# Patient Record
Sex: Male | Born: 1955
Health system: Southern US, Community
[De-identification: ages and names within clinical notes are randomized; demographics above are authoritative.]

## PROBLEM LIST (undated history)

## (undated) ENCOUNTER — Emergency Department (HOSPITAL_COMMUNITY): Discharge status: Against Medical Advice | Payer: Medicaid Other

## (undated) DIAGNOSIS — K5900 Constipation, unspecified: Secondary | ICD-10-CM

## (undated) DIAGNOSIS — J189 Pneumonia, unspecified organism: Secondary | ICD-10-CM

## (undated) DIAGNOSIS — K219 Gastro-esophageal reflux disease without esophagitis: Secondary | ICD-10-CM

## (undated) DIAGNOSIS — R413 Other amnesia: Secondary | ICD-10-CM

## (undated) DIAGNOSIS — K635 Polyp of colon: Secondary | ICD-10-CM

## (undated) DIAGNOSIS — R569 Unspecified convulsions: Secondary | ICD-10-CM

## (undated) DIAGNOSIS — E785 Hyperlipidemia, unspecified: Secondary | ICD-10-CM

## (undated) DIAGNOSIS — H9190 Unspecified hearing loss, unspecified ear: Secondary | ICD-10-CM

## (undated) DIAGNOSIS — F329 Major depressive disorder, single episode, unspecified: Secondary | ICD-10-CM

## (undated) DIAGNOSIS — Z9289 Personal history of other medical treatment: Secondary | ICD-10-CM

## (undated) DIAGNOSIS — K56609 Unspecified intestinal obstruction, unspecified as to partial versus complete obstruction: Secondary | ICD-10-CM

## (undated) DIAGNOSIS — F419 Anxiety disorder, unspecified: Secondary | ICD-10-CM

## (undated) DIAGNOSIS — F32A Depression, unspecified: Secondary | ICD-10-CM

## (undated) DIAGNOSIS — Z22322 Carrier or suspected carrier of Methicillin resistant Staphylococcus aureus: Secondary | ICD-10-CM

## (undated) DIAGNOSIS — I10 Essential (primary) hypertension: Secondary | ICD-10-CM

## (undated) DIAGNOSIS — C4490 Unspecified malignant neoplasm of skin, unspecified: Secondary | ICD-10-CM

## (undated) DIAGNOSIS — E119 Type 2 diabetes mellitus without complications: Secondary | ICD-10-CM

## (undated) HISTORY — DX: Unspecified intestinal obstruction, unspecified as to partial versus complete obstruction: K56.609

## (undated) HISTORY — PX: ORIF SHOULDER FRACTURE: SHX5035

## (undated) HISTORY — DX: Unspecified malignant neoplasm of skin, unspecified: C44.90

## (undated) HISTORY — DX: Carrier or suspected carrier of methicillin resistant Staphylococcus aureus: Z22.322

## (undated) HISTORY — PX: OTHER SURGICAL HISTORY: SHX169

## (undated) HISTORY — DX: Constipation, unspecified: K59.00

## (undated) HISTORY — DX: Depression, unspecified: F32.A

## (undated) HISTORY — DX: Pneumonia, unspecified organism: J18.9

## (undated) HISTORY — DX: Other amnesia: R41.3

## (undated) HISTORY — DX: Polyp of colon: K63.5

## (undated) HISTORY — PX: CARPAL TUNNEL RELEASE: SHX101

## (undated) HISTORY — DX: Type 2 diabetes mellitus without complications: E11.9

## (undated) HISTORY — DX: Personal history of other medical treatment: Z92.89

## (undated) HISTORY — DX: Major depressive disorder, single episode, unspecified: F32.9

## (undated) HISTORY — DX: Unspecified convulsions: R56.9

## (undated) HISTORY — DX: Unspecified hearing loss, unspecified ear: H91.90

---

## 1998-03-23 ENCOUNTER — Encounter: Admission: RE | Admit: 1998-03-23 | Discharge: 1998-03-23 | Payer: Self-pay | Admitting: Sports Medicine

## 1998-04-12 ENCOUNTER — Encounter: Admission: RE | Admit: 1998-04-12 | Discharge: 1998-04-12 | Payer: Self-pay | Admitting: Family Medicine

## 1998-05-16 ENCOUNTER — Encounter: Admission: RE | Admit: 1998-05-16 | Discharge: 1998-05-16 | Payer: Self-pay | Admitting: Sports Medicine

## 1998-05-17 ENCOUNTER — Encounter: Admission: RE | Admit: 1998-05-17 | Discharge: 1998-05-17 | Payer: Self-pay | Admitting: Family Medicine

## 1998-07-11 ENCOUNTER — Encounter: Admission: RE | Admit: 1998-07-11 | Discharge: 1998-07-11 | Payer: Self-pay | Admitting: Family Medicine

## 1998-10-04 ENCOUNTER — Encounter: Admission: RE | Admit: 1998-10-04 | Discharge: 1998-10-04 | Payer: Self-pay | Admitting: Sports Medicine

## 1998-10-05 ENCOUNTER — Encounter: Admission: RE | Admit: 1998-10-05 | Discharge: 1998-10-05 | Payer: Self-pay | Admitting: Family Medicine

## 1998-11-07 ENCOUNTER — Encounter: Admission: RE | Admit: 1998-11-07 | Discharge: 1998-11-07 | Payer: Self-pay | Admitting: Family Medicine

## 1998-11-20 ENCOUNTER — Emergency Department (HOSPITAL_COMMUNITY): Admission: EM | Admit: 1998-11-20 | Discharge: 1998-11-20 | Payer: Self-pay | Admitting: Emergency Medicine

## 1998-11-30 ENCOUNTER — Encounter: Admission: RE | Admit: 1998-11-30 | Discharge: 1998-11-30 | Payer: Self-pay | Admitting: Family Medicine

## 1998-12-20 ENCOUNTER — Encounter: Admission: RE | Admit: 1998-12-20 | Discharge: 1998-12-20 | Payer: Self-pay | Admitting: Sports Medicine

## 1998-12-28 ENCOUNTER — Encounter: Admission: RE | Admit: 1998-12-28 | Discharge: 1998-12-28 | Payer: Self-pay | Admitting: Family Medicine

## 1999-01-24 ENCOUNTER — Encounter: Admission: RE | Admit: 1999-01-24 | Discharge: 1999-01-24 | Payer: Self-pay | Admitting: Sports Medicine

## 1999-02-15 ENCOUNTER — Encounter: Admission: RE | Admit: 1999-02-15 | Discharge: 1999-02-15 | Payer: Self-pay | Admitting: *Deleted

## 1999-03-22 ENCOUNTER — Encounter: Admission: RE | Admit: 1999-03-22 | Discharge: 1999-03-22 | Payer: Self-pay | Admitting: Family Medicine

## 1999-04-05 ENCOUNTER — Encounter: Admission: RE | Admit: 1999-04-05 | Discharge: 1999-04-05 | Payer: Self-pay | Admitting: Family Medicine

## 1999-04-13 ENCOUNTER — Encounter: Admission: RE | Admit: 1999-04-13 | Discharge: 1999-04-13 | Payer: Self-pay | Admitting: Family Medicine

## 1999-04-19 ENCOUNTER — Encounter: Admission: RE | Admit: 1999-04-19 | Discharge: 1999-04-19 | Payer: Self-pay | Admitting: Family Medicine

## 1999-05-01 ENCOUNTER — Encounter: Admission: RE | Admit: 1999-05-01 | Discharge: 1999-05-01 | Payer: Self-pay | Admitting: Family Medicine

## 1999-06-01 ENCOUNTER — Encounter: Admission: RE | Admit: 1999-06-01 | Discharge: 1999-06-01 | Payer: Self-pay | Admitting: Family Medicine

## 1999-06-29 ENCOUNTER — Encounter: Admission: RE | Admit: 1999-06-29 | Discharge: 1999-06-29 | Payer: Self-pay | Admitting: Family Medicine

## 1999-07-07 ENCOUNTER — Encounter: Admission: RE | Admit: 1999-07-07 | Discharge: 1999-07-07 | Payer: Self-pay | Admitting: Family Medicine

## 1999-07-13 ENCOUNTER — Inpatient Hospital Stay (HOSPITAL_COMMUNITY): Admission: EM | Admit: 1999-07-13 | Discharge: 1999-07-14 | Payer: Self-pay | Admitting: *Deleted

## 1999-07-14 ENCOUNTER — Inpatient Hospital Stay (HOSPITAL_COMMUNITY): Admission: AD | Admit: 1999-07-14 | Discharge: 1999-07-26 | Payer: Self-pay | Admitting: Psychiatry

## 1999-07-14 ENCOUNTER — Encounter: Admission: RE | Admit: 1999-07-14 | Discharge: 1999-07-14 | Payer: Self-pay | Admitting: Family Medicine

## 1999-07-19 ENCOUNTER — Encounter: Payer: Self-pay | Admitting: Pediatrics

## 1999-07-26 ENCOUNTER — Encounter: Admission: RE | Admit: 1999-07-26 | Discharge: 1999-07-26 | Payer: Self-pay | Admitting: Family Medicine

## 1999-08-16 ENCOUNTER — Encounter: Admission: RE | Admit: 1999-08-16 | Discharge: 1999-08-16 | Payer: Self-pay | Admitting: Family Medicine

## 1999-09-05 ENCOUNTER — Encounter: Admission: RE | Admit: 1999-09-05 | Discharge: 1999-09-05 | Payer: Self-pay | Admitting: Family Medicine

## 1999-09-16 ENCOUNTER — Emergency Department (HOSPITAL_COMMUNITY): Admission: EM | Admit: 1999-09-16 | Discharge: 1999-09-16 | Payer: Self-pay

## 1999-09-20 ENCOUNTER — Emergency Department (HOSPITAL_COMMUNITY): Admission: EM | Admit: 1999-09-20 | Discharge: 1999-09-20 | Payer: Self-pay | Admitting: Emergency Medicine

## 1999-09-21 ENCOUNTER — Encounter: Admission: RE | Admit: 1999-09-21 | Discharge: 1999-09-21 | Payer: Self-pay | Admitting: Family Medicine

## 1999-09-25 ENCOUNTER — Encounter: Admission: RE | Admit: 1999-09-25 | Discharge: 1999-09-25 | Payer: Self-pay | Admitting: Family Medicine

## 1999-09-26 ENCOUNTER — Emergency Department (HOSPITAL_COMMUNITY): Admission: EM | Admit: 1999-09-26 | Discharge: 1999-09-26 | Payer: Self-pay | Admitting: Internal Medicine

## 1999-10-19 ENCOUNTER — Encounter: Admission: RE | Admit: 1999-10-19 | Discharge: 1999-10-19 | Payer: Self-pay | Admitting: Family Medicine

## 1999-11-16 ENCOUNTER — Encounter: Admission: RE | Admit: 1999-11-16 | Discharge: 1999-11-16 | Payer: Self-pay | Admitting: Family Medicine

## 2000-01-11 ENCOUNTER — Encounter: Admission: RE | Admit: 2000-01-11 | Discharge: 2000-01-11 | Payer: Self-pay | Admitting: Family Medicine

## 2000-02-04 ENCOUNTER — Emergency Department (HOSPITAL_COMMUNITY): Admission: EM | Admit: 2000-02-04 | Discharge: 2000-02-04 | Payer: Self-pay | Admitting: Emergency Medicine

## 2000-04-01 ENCOUNTER — Encounter: Admission: RE | Admit: 2000-04-01 | Discharge: 2000-04-01 | Payer: Self-pay | Admitting: Family Medicine

## 2000-10-14 ENCOUNTER — Encounter: Admission: RE | Admit: 2000-10-14 | Discharge: 2000-10-14 | Payer: Self-pay | Admitting: Family Medicine

## 2001-02-27 ENCOUNTER — Encounter: Admission: RE | Admit: 2001-02-27 | Discharge: 2001-02-27 | Payer: Self-pay | Admitting: Sports Medicine

## 2001-05-05 ENCOUNTER — Emergency Department (HOSPITAL_COMMUNITY): Admission: EM | Admit: 2001-05-05 | Discharge: 2001-05-05 | Payer: Self-pay | Admitting: Emergency Medicine

## 2001-05-09 ENCOUNTER — Encounter: Admission: RE | Admit: 2001-05-09 | Discharge: 2001-05-09 | Payer: Self-pay | Admitting: Family Medicine

## 2001-10-27 ENCOUNTER — Encounter: Admission: RE | Admit: 2001-10-27 | Discharge: 2001-10-27 | Payer: Self-pay | Admitting: Family Medicine

## 2001-10-28 ENCOUNTER — Encounter: Admission: RE | Admit: 2001-10-28 | Discharge: 2001-10-28 | Payer: Self-pay | Admitting: Family Medicine

## 2001-11-28 ENCOUNTER — Encounter: Admission: RE | Admit: 2001-11-28 | Discharge: 2001-11-28 | Payer: Self-pay | Admitting: Family Medicine

## 2001-12-05 ENCOUNTER — Encounter: Admission: RE | Admit: 2001-12-05 | Discharge: 2001-12-05 | Payer: Self-pay | Admitting: Family Medicine

## 2001-12-11 ENCOUNTER — Inpatient Hospital Stay (HOSPITAL_COMMUNITY): Admission: EM | Admit: 2001-12-11 | Discharge: 2001-12-15 | Payer: Self-pay | Admitting: Emergency Medicine

## 2001-12-11 ENCOUNTER — Encounter: Payer: Self-pay | Admitting: Emergency Medicine

## 2001-12-13 ENCOUNTER — Encounter: Payer: Self-pay | Admitting: Sports Medicine

## 2001-12-14 ENCOUNTER — Encounter: Payer: Self-pay | Admitting: Sports Medicine

## 2001-12-15 ENCOUNTER — Encounter: Payer: Self-pay | Admitting: Sports Medicine

## 2001-12-15 ENCOUNTER — Inpatient Hospital Stay (HOSPITAL_COMMUNITY): Admission: EM | Admit: 2001-12-15 | Discharge: 2001-12-23 | Payer: Self-pay | Admitting: Psychiatry

## 2001-12-22 ENCOUNTER — Encounter: Admission: RE | Admit: 2001-12-22 | Discharge: 2001-12-22 | Payer: Self-pay | Admitting: Sports Medicine

## 2001-12-25 ENCOUNTER — Encounter: Admission: RE | Admit: 2001-12-25 | Discharge: 2001-12-25 | Payer: Self-pay | Admitting: Family Medicine

## 2002-01-01 ENCOUNTER — Encounter: Admission: RE | Admit: 2002-01-01 | Discharge: 2002-01-01 | Payer: Self-pay | Admitting: Family Medicine

## 2002-01-16 ENCOUNTER — Encounter: Admission: RE | Admit: 2002-01-16 | Discharge: 2002-01-16 | Payer: Self-pay | Admitting: Family Medicine

## 2002-02-13 ENCOUNTER — Encounter: Admission: RE | Admit: 2002-02-13 | Discharge: 2002-02-13 | Payer: Self-pay | Admitting: Family Medicine

## 2002-02-23 ENCOUNTER — Encounter: Admission: RE | Admit: 2002-02-23 | Discharge: 2002-02-23 | Payer: Self-pay | Admitting: *Deleted

## 2002-03-02 ENCOUNTER — Encounter: Admission: RE | Admit: 2002-03-02 | Discharge: 2002-03-02 | Payer: Self-pay | Admitting: Family Medicine

## 2002-03-09 ENCOUNTER — Encounter: Admission: RE | Admit: 2002-03-09 | Discharge: 2002-03-09 | Payer: Self-pay | Admitting: *Deleted

## 2002-03-16 ENCOUNTER — Encounter: Admission: RE | Admit: 2002-03-16 | Discharge: 2002-03-16 | Payer: Self-pay | Admitting: Family Medicine

## 2002-03-20 ENCOUNTER — Encounter: Admission: RE | Admit: 2002-03-20 | Discharge: 2002-03-20 | Payer: Self-pay | Admitting: Family Medicine

## 2002-03-30 ENCOUNTER — Encounter: Admission: RE | Admit: 2002-03-30 | Discharge: 2002-03-30 | Payer: Self-pay | Admitting: Family Medicine

## 2002-04-02 ENCOUNTER — Encounter: Admission: RE | Admit: 2002-04-02 | Discharge: 2002-04-02 | Payer: Self-pay | Admitting: Family Medicine

## 2002-04-08 ENCOUNTER — Encounter: Admission: RE | Admit: 2002-04-08 | Discharge: 2002-04-08 | Payer: Self-pay | Admitting: *Deleted

## 2002-04-16 ENCOUNTER — Encounter: Admission: RE | Admit: 2002-04-16 | Discharge: 2002-04-16 | Payer: Self-pay | Admitting: Family Medicine

## 2002-04-28 ENCOUNTER — Encounter: Admission: RE | Admit: 2002-04-28 | Discharge: 2002-04-28 | Payer: Self-pay | Admitting: Family Medicine

## 2002-04-30 ENCOUNTER — Encounter: Admission: RE | Admit: 2002-04-30 | Discharge: 2002-04-30 | Payer: Self-pay | Admitting: Family Medicine

## 2002-05-13 ENCOUNTER — Encounter: Admission: RE | Admit: 2002-05-13 | Discharge: 2002-05-13 | Payer: Self-pay | Admitting: Family Medicine

## 2002-05-29 ENCOUNTER — Encounter: Admission: RE | Admit: 2002-05-29 | Discharge: 2002-05-29 | Payer: Self-pay | Admitting: Family Medicine

## 2002-06-26 ENCOUNTER — Encounter: Admission: RE | Admit: 2002-06-26 | Discharge: 2002-06-26 | Payer: Self-pay | Admitting: Family Medicine

## 2002-07-10 ENCOUNTER — Encounter: Admission: RE | Admit: 2002-07-10 | Discharge: 2002-07-10 | Payer: Self-pay | Admitting: Family Medicine

## 2002-07-14 ENCOUNTER — Encounter: Admission: RE | Admit: 2002-07-14 | Discharge: 2002-07-14 | Payer: Self-pay | Admitting: Family Medicine

## 2002-08-19 ENCOUNTER — Encounter: Admission: RE | Admit: 2002-08-19 | Discharge: 2002-08-19 | Payer: Self-pay | Admitting: Family Medicine

## 2002-09-21 ENCOUNTER — Emergency Department (HOSPITAL_COMMUNITY): Admission: EM | Admit: 2002-09-21 | Discharge: 2002-09-21 | Payer: Self-pay | Admitting: Emergency Medicine

## 2002-10-14 ENCOUNTER — Encounter: Admission: RE | Admit: 2002-10-14 | Discharge: 2002-10-14 | Payer: Self-pay | Admitting: Family Medicine

## 2003-01-06 ENCOUNTER — Encounter: Admission: RE | Admit: 2003-01-06 | Discharge: 2003-01-06 | Payer: Self-pay | Admitting: Family Medicine

## 2003-03-08 ENCOUNTER — Inpatient Hospital Stay (HOSPITAL_COMMUNITY): Admission: EM | Admit: 2003-03-08 | Discharge: 2003-03-09 | Payer: Self-pay | Admitting: Emergency Medicine

## 2003-03-08 ENCOUNTER — Encounter: Payer: Self-pay | Admitting: Emergency Medicine

## 2003-03-10 ENCOUNTER — Inpatient Hospital Stay (HOSPITAL_COMMUNITY): Admission: EM | Admit: 2003-03-10 | Discharge: 2003-03-15 | Payer: Self-pay | Admitting: Psychiatry

## 2003-04-13 ENCOUNTER — Encounter: Admission: RE | Admit: 2003-04-13 | Discharge: 2003-04-13 | Payer: Self-pay | Admitting: Family Medicine

## 2003-05-24 ENCOUNTER — Encounter: Admission: RE | Admit: 2003-05-24 | Discharge: 2003-05-24 | Payer: Self-pay | Admitting: Sports Medicine

## 2003-06-25 ENCOUNTER — Encounter: Admission: RE | Admit: 2003-06-25 | Discharge: 2003-06-25 | Payer: Self-pay | Admitting: Family Medicine

## 2004-02-03 ENCOUNTER — Encounter: Admission: RE | Admit: 2004-02-03 | Discharge: 2004-02-03 | Payer: Self-pay | Admitting: Family Medicine

## 2004-03-28 ENCOUNTER — Inpatient Hospital Stay (HOSPITAL_COMMUNITY): Admission: EM | Admit: 2004-03-28 | Discharge: 2004-04-01 | Payer: Self-pay | Admitting: Emergency Medicine

## 2004-03-31 ENCOUNTER — Encounter (INDEPENDENT_AMBULATORY_CARE_PROVIDER_SITE_OTHER): Payer: Self-pay | Admitting: *Deleted

## 2004-06-02 ENCOUNTER — Observation Stay (HOSPITAL_COMMUNITY): Admission: EM | Admit: 2004-06-02 | Discharge: 2004-06-02 | Payer: Self-pay | Admitting: Emergency Medicine

## 2004-06-06 ENCOUNTER — Encounter: Admission: RE | Admit: 2004-06-06 | Discharge: 2004-06-06 | Payer: Self-pay | Admitting: Family Medicine

## 2004-06-15 ENCOUNTER — Emergency Department (HOSPITAL_COMMUNITY): Admission: EM | Admit: 2004-06-15 | Discharge: 2004-06-15 | Payer: Self-pay | Admitting: Emergency Medicine

## 2004-07-18 ENCOUNTER — Emergency Department (HOSPITAL_COMMUNITY): Admission: EM | Admit: 2004-07-18 | Discharge: 2004-07-19 | Payer: Self-pay | Admitting: Emergency Medicine

## 2004-10-02 ENCOUNTER — Ambulatory Visit: Payer: Self-pay | Admitting: Family Medicine

## 2004-12-06 ENCOUNTER — Ambulatory Visit: Payer: Self-pay | Admitting: Sports Medicine

## 2004-12-20 ENCOUNTER — Ambulatory Visit: Payer: Self-pay | Admitting: Family Medicine

## 2004-12-27 ENCOUNTER — Ambulatory Visit: Payer: Self-pay | Admitting: Family Medicine

## 2005-01-04 ENCOUNTER — Emergency Department (HOSPITAL_COMMUNITY): Admission: EM | Admit: 2005-01-04 | Discharge: 2005-01-04 | Payer: Self-pay | Admitting: Emergency Medicine

## 2005-06-26 ENCOUNTER — Emergency Department (HOSPITAL_COMMUNITY): Admission: EM | Admit: 2005-06-26 | Discharge: 2005-06-26 | Payer: Self-pay | Admitting: Emergency Medicine

## 2005-08-16 ENCOUNTER — Ambulatory Visit: Payer: Self-pay | Admitting: Family Medicine

## 2005-10-10 ENCOUNTER — Ambulatory Visit: Payer: Self-pay | Admitting: Family Medicine

## 2005-10-15 ENCOUNTER — Ambulatory Visit: Payer: Self-pay | Admitting: Sports Medicine

## 2005-10-19 ENCOUNTER — Ambulatory Visit: Payer: Self-pay | Admitting: Family Medicine

## 2005-10-22 ENCOUNTER — Ambulatory Visit: Payer: Self-pay | Admitting: Family Medicine

## 2005-10-23 ENCOUNTER — Ambulatory Visit: Payer: Self-pay | Admitting: Family Medicine

## 2005-11-16 ENCOUNTER — Ambulatory Visit: Payer: Self-pay | Admitting: Family Medicine

## 2005-11-27 ENCOUNTER — Ambulatory Visit: Payer: Self-pay | Admitting: Family Medicine

## 2005-12-18 ENCOUNTER — Ambulatory Visit: Payer: Self-pay | Admitting: Sports Medicine

## 2005-12-27 ENCOUNTER — Ambulatory Visit: Payer: Self-pay | Admitting: Family Medicine

## 2006-01-03 ENCOUNTER — Ambulatory Visit: Payer: Self-pay | Admitting: Family Medicine

## 2006-01-18 ENCOUNTER — Ambulatory Visit (HOSPITAL_COMMUNITY): Admission: RE | Admit: 2006-01-18 | Discharge: 2006-01-18 | Payer: Self-pay | Admitting: Family Medicine

## 2006-07-01 ENCOUNTER — Ambulatory Visit: Payer: Self-pay | Admitting: Family Medicine

## 2006-07-22 ENCOUNTER — Ambulatory Visit: Payer: Self-pay | Admitting: Sports Medicine

## 2006-11-30 ENCOUNTER — Emergency Department (HOSPITAL_COMMUNITY): Admission: EM | Admit: 2006-11-30 | Discharge: 2006-11-30 | Payer: Self-pay | Admitting: Family Medicine

## 2007-01-10 ENCOUNTER — Encounter (INDEPENDENT_AMBULATORY_CARE_PROVIDER_SITE_OTHER): Payer: Self-pay | Admitting: Family Medicine

## 2007-01-10 ENCOUNTER — Ambulatory Visit: Payer: Self-pay | Admitting: Family Medicine

## 2007-01-10 LAB — CONVERTED CEMR LAB: Phenytoin Lvl: 14.6 ug/mL (ref 10.0–20.0)

## 2007-01-21 ENCOUNTER — Ambulatory Visit: Payer: Self-pay | Admitting: Family Medicine

## 2007-01-21 ENCOUNTER — Encounter (INDEPENDENT_AMBULATORY_CARE_PROVIDER_SITE_OTHER): Payer: Self-pay | Admitting: Family Medicine

## 2007-01-21 LAB — CONVERTED CEMR LAB
ALT: 25 units/L (ref 0–53)
AST: 18 units/L (ref 0–37)
Albumin: 4.3 g/dL (ref 3.5–5.2)
Alkaline Phosphatase: 80 units/L (ref 39–117)
BUN: 22 mg/dL (ref 6–23)
CO2: 24 meq/L (ref 19–32)
Calcium: 9.5 mg/dL (ref 8.4–10.5)
Chloride: 104 meq/L (ref 96–112)
Cholesterol: 138 mg/dL (ref 0–200)
Creatinine, Ser: 1.1 mg/dL (ref 0.40–1.50)
Glucose, Bld: 92 mg/dL (ref 70–99)
HDL: 55 mg/dL (ref >39)
LDL Cholesterol: 65 mg/dL (ref 0–99)
Potassium: 5.4 meq/L — ABNORMAL HIGH (ref 3.5–5.3)
Sodium: 138 meq/L (ref 135–145)
Total Bilirubin: 0.4 mg/dL (ref 0.3–1.2)
Total CHOL/HDL Ratio: 2.5
Total Protein: 7.1 g/dL (ref 6.0–8.3)
Triglycerides: 91 mg/dL (ref <150)
VLDL: 18 mg/dL (ref 0–40)

## 2007-02-05 ENCOUNTER — Ambulatory Visit: Payer: Self-pay | Admitting: Family Medicine

## 2007-02-05 ENCOUNTER — Encounter (INDEPENDENT_AMBULATORY_CARE_PROVIDER_SITE_OTHER): Payer: Self-pay | Admitting: Family Medicine

## 2007-02-05 LAB — CONVERTED CEMR LAB: Potassium: 4.1 meq/L (ref 3.5–5.3)

## 2007-02-27 ENCOUNTER — Ambulatory Visit: Payer: Self-pay | Admitting: Family Medicine

## 2007-02-27 ENCOUNTER — Telehealth: Payer: Self-pay | Admitting: *Deleted

## 2007-02-27 LAB — CONVERTED CEMR LAB: Rapid Strep: NEGATIVE

## 2007-02-28 ENCOUNTER — Encounter: Admission: RE | Admit: 2007-02-28 | Discharge: 2007-02-28 | Payer: Self-pay | Admitting: Sports Medicine

## 2007-03-01 ENCOUNTER — Emergency Department (HOSPITAL_COMMUNITY): Admission: EM | Admit: 2007-03-01 | Discharge: 2007-03-01 | Payer: Self-pay | Admitting: Family Medicine

## 2007-03-03 ENCOUNTER — Telehealth (INDEPENDENT_AMBULATORY_CARE_PROVIDER_SITE_OTHER): Payer: Self-pay | Admitting: *Deleted

## 2007-03-03 ENCOUNTER — Ambulatory Visit: Payer: Self-pay | Admitting: Family Medicine

## 2007-03-04 ENCOUNTER — Telehealth: Payer: Self-pay | Admitting: *Deleted

## 2007-05-20 ENCOUNTER — Encounter (INDEPENDENT_AMBULATORY_CARE_PROVIDER_SITE_OTHER): Payer: Self-pay | Admitting: Family Medicine

## 2007-05-20 ENCOUNTER — Ambulatory Visit: Payer: Self-pay | Admitting: Sports Medicine

## 2007-05-20 DIAGNOSIS — K219 Gastro-esophageal reflux disease without esophagitis: Secondary | ICD-10-CM | POA: Insufficient documentation

## 2007-05-20 LAB — CONVERTED CEMR LAB
BUN: 13 mg/dL (ref 6–23)
CO2: 24 meq/L (ref 19–32)
Calcium: 9.1 mg/dL (ref 8.4–10.5)
Chloride: 108 meq/L (ref 96–112)
Creatinine, Ser: 1.09 mg/dL (ref 0.40–1.50)
Glucose, Bld: 100 mg/dL — ABNORMAL HIGH (ref 70–99)
PSA: 0.4 ng/mL (ref 0.10–4.00)
PSA: NORMAL ng/mL
Potassium: 4.8 meq/L (ref 3.5–5.3)
Sodium: 141 meq/L (ref 135–145)

## 2007-05-21 ENCOUNTER — Encounter (INDEPENDENT_AMBULATORY_CARE_PROVIDER_SITE_OTHER): Payer: Self-pay | Admitting: Family Medicine

## 2007-06-20 ENCOUNTER — Emergency Department (HOSPITAL_COMMUNITY): Admission: EM | Admit: 2007-06-20 | Discharge: 2007-06-20 | Payer: Self-pay | Admitting: Emergency Medicine

## 2007-06-27 ENCOUNTER — Telehealth: Payer: Self-pay | Admitting: *Deleted

## 2007-07-01 ENCOUNTER — Ambulatory Visit: Payer: Self-pay | Admitting: Family Medicine

## 2007-07-01 DIAGNOSIS — M949 Disorder of cartilage, unspecified: Secondary | ICD-10-CM

## 2007-07-01 DIAGNOSIS — M899 Disorder of bone, unspecified: Secondary | ICD-10-CM | POA: Insufficient documentation

## 2007-07-02 ENCOUNTER — Encounter (INDEPENDENT_AMBULATORY_CARE_PROVIDER_SITE_OTHER): Payer: Self-pay | Admitting: Family Medicine

## 2007-07-02 ENCOUNTER — Ambulatory Visit: Payer: Self-pay | Admitting: Family Medicine

## 2007-07-02 LAB — CONVERTED CEMR LAB
ALT: 32 units/L (ref 0–53)
AST: 17 units/L (ref 0–37)
Albumin: 4.3 g/dL (ref 3.5–5.2)
Alkaline Phosphatase: 71 units/L (ref 39–117)
BUN: 14 mg/dL (ref 6–23)
CO2: 23 meq/L (ref 19–32)
Calcium: 8.9 mg/dL (ref 8.4–10.5)
Chloride: 106 meq/L (ref 96–112)
Creatinine, Ser: 0.99 mg/dL (ref 0.40–1.50)
Glucose, Bld: 100 mg/dL — ABNORMAL HIGH (ref 70–99)
Phenytoin Lvl: 12.6 ug/mL (ref 10.0–20.0)
Potassium: 4.7 meq/L (ref 3.5–5.3)
Sodium: 143 meq/L (ref 135–145)
Total Bilirubin: 0.4 mg/dL (ref 0.3–1.2)
Total Protein: 6.7 g/dL (ref 6.0–8.3)

## 2007-07-04 ENCOUNTER — Encounter (INDEPENDENT_AMBULATORY_CARE_PROVIDER_SITE_OTHER): Payer: Self-pay | Admitting: Family Medicine

## 2007-07-10 ENCOUNTER — Encounter (INDEPENDENT_AMBULATORY_CARE_PROVIDER_SITE_OTHER): Payer: Self-pay | Admitting: Family Medicine

## 2007-07-16 ENCOUNTER — Telehealth: Payer: Self-pay | Admitting: *Deleted

## 2007-10-29 ENCOUNTER — Ambulatory Visit: Payer: Self-pay | Admitting: Family Medicine

## 2007-11-05 ENCOUNTER — Emergency Department (HOSPITAL_COMMUNITY): Admission: EM | Admit: 2007-11-05 | Discharge: 2007-11-05 | Payer: Self-pay | Admitting: Family Medicine

## 2007-12-12 ENCOUNTER — Telehealth (INDEPENDENT_AMBULATORY_CARE_PROVIDER_SITE_OTHER): Payer: Self-pay | Admitting: *Deleted

## 2008-01-21 ENCOUNTER — Telehealth: Payer: Self-pay | Admitting: Family Medicine

## 2008-01-26 ENCOUNTER — Telehealth: Payer: Self-pay | Admitting: *Deleted

## 2008-01-28 ENCOUNTER — Encounter: Payer: Self-pay | Admitting: Family Medicine

## 2008-01-28 ENCOUNTER — Ambulatory Visit: Payer: Self-pay | Admitting: Family Medicine

## 2008-01-28 DIAGNOSIS — E785 Hyperlipidemia, unspecified: Secondary | ICD-10-CM | POA: Insufficient documentation

## 2008-01-28 DIAGNOSIS — F332 Major depressive disorder, recurrent severe without psychotic features: Secondary | ICD-10-CM | POA: Insufficient documentation

## 2008-01-28 DIAGNOSIS — G40909 Epilepsy, unspecified, not intractable, without status epilepticus: Secondary | ICD-10-CM | POA: Insufficient documentation

## 2008-01-28 DIAGNOSIS — I10 Essential (primary) hypertension: Secondary | ICD-10-CM | POA: Insufficient documentation

## 2008-01-28 LAB — CONVERTED CEMR LAB
BUN: 15 mg/dL (ref 6–23)
CO2: 24 meq/L (ref 19–32)
Calcium: 9.6 mg/dL (ref 8.4–10.5)
Chloride: 104 meq/L (ref 96–112)
Cholesterol: 160 mg/dL (ref 0–200)
Creatinine, Ser: 1.06 mg/dL (ref 0.40–1.50)
Glucose, Bld: 99 mg/dL (ref 70–99)
HDL: 56 mg/dL (ref >39)
LDL Cholesterol: 84 mg/dL (ref 0–99)
Phenytoin Lvl: 15.9 ug/mL (ref 10.0–20.0)
Potassium: 4.5 meq/L (ref 3.5–5.3)
Sodium: 139 meq/L (ref 135–145)
Testosterone: 689.95 ng/dL (ref 350–890)
Total CHOL/HDL Ratio: 2.9
Triglycerides: 102 mg/dL (ref <150)
VLDL: 20 mg/dL (ref 0–40)

## 2008-02-01 ENCOUNTER — Encounter: Payer: Self-pay | Admitting: Family Medicine

## 2008-02-04 ENCOUNTER — Telehealth: Payer: Self-pay | Admitting: *Deleted

## 2008-02-06 ENCOUNTER — Telehealth: Payer: Self-pay | Admitting: Family Medicine

## 2008-02-12 ENCOUNTER — Encounter: Payer: Self-pay | Admitting: Family Medicine

## 2008-03-17 ENCOUNTER — Encounter: Payer: Self-pay | Admitting: Family Medicine

## 2008-04-08 ENCOUNTER — Encounter (INDEPENDENT_AMBULATORY_CARE_PROVIDER_SITE_OTHER): Payer: Self-pay | Admitting: Family Medicine

## 2008-04-08 ENCOUNTER — Ambulatory Visit: Payer: Self-pay

## 2008-04-09 LAB — CONVERTED CEMR LAB: Phenytoin Lvl: 12.7 ug/mL (ref 10.0–20.0)

## 2008-04-15 ENCOUNTER — Ambulatory Visit: Payer: Self-pay | Admitting: Family Medicine

## 2008-04-15 ENCOUNTER — Encounter (INDEPENDENT_AMBULATORY_CARE_PROVIDER_SITE_OTHER): Payer: Self-pay | Admitting: *Deleted

## 2008-04-22 ENCOUNTER — Encounter: Payer: Self-pay | Admitting: *Deleted

## 2008-04-28 ENCOUNTER — Encounter: Admission: RE | Admit: 2008-04-28 | Discharge: 2008-04-28 | Payer: Self-pay | Admitting: Family Medicine

## 2008-04-28 ENCOUNTER — Ambulatory Visit: Payer: Self-pay | Admitting: Family Medicine

## 2008-04-28 ENCOUNTER — Telehealth: Payer: Self-pay | Admitting: *Deleted

## 2008-04-29 ENCOUNTER — Telehealth: Payer: Self-pay | Admitting: *Deleted

## 2008-05-03 ENCOUNTER — Telehealth: Payer: Self-pay | Admitting: *Deleted

## 2008-05-17 ENCOUNTER — Telehealth: Payer: Self-pay | Admitting: Family Medicine

## 2008-05-18 ENCOUNTER — Telehealth: Payer: Self-pay | Admitting: *Deleted

## 2008-05-21 ENCOUNTER — Ambulatory Visit: Payer: Self-pay | Admitting: Family Medicine

## 2008-05-28 ENCOUNTER — Ambulatory Visit (HOSPITAL_COMMUNITY): Admission: RE | Admit: 2008-05-28 | Discharge: 2008-05-28 | Payer: Self-pay | Admitting: Family Medicine

## 2008-06-09 ENCOUNTER — Encounter: Payer: Self-pay | Admitting: Family Medicine

## 2008-06-16 ENCOUNTER — Telehealth (INDEPENDENT_AMBULATORY_CARE_PROVIDER_SITE_OTHER): Payer: Self-pay | Admitting: Family Medicine

## 2008-06-24 ENCOUNTER — Ambulatory Visit: Payer: Self-pay | Admitting: Family Medicine

## 2008-06-25 ENCOUNTER — Telehealth: Payer: Self-pay | Admitting: Family Medicine

## 2008-06-30 ENCOUNTER — Telehealth: Payer: Self-pay | Admitting: *Deleted

## 2008-07-05 ENCOUNTER — Encounter: Admission: RE | Admit: 2008-07-05 | Discharge: 2008-07-05 | Payer: Self-pay | Admitting: Family Medicine

## 2008-07-12 ENCOUNTER — Telehealth: Payer: Self-pay | Admitting: *Deleted

## 2008-07-19 ENCOUNTER — Encounter: Payer: Self-pay | Admitting: Family Medicine

## 2008-07-27 ENCOUNTER — Encounter: Payer: Self-pay | Admitting: Family Medicine

## 2008-07-27 LAB — CONVERTED CEMR LAB
PSA: 3.58 ng/mL
Testosterone: 429 ng/dL

## 2008-07-29 ENCOUNTER — Ambulatory Visit: Payer: Self-pay | Admitting: Family Medicine

## 2008-07-29 ENCOUNTER — Encounter: Admission: RE | Admit: 2008-07-29 | Discharge: 2008-07-29 | Payer: Self-pay | Admitting: Family Medicine

## 2008-08-04 ENCOUNTER — Telehealth: Payer: Self-pay | Admitting: *Deleted

## 2008-08-04 ENCOUNTER — Emergency Department (HOSPITAL_COMMUNITY): Admission: EM | Admit: 2008-08-04 | Discharge: 2008-08-04 | Payer: Self-pay | Admitting: Emergency Medicine

## 2008-08-11 ENCOUNTER — Encounter (INDEPENDENT_AMBULATORY_CARE_PROVIDER_SITE_OTHER): Payer: Self-pay | Admitting: *Deleted

## 2008-08-16 ENCOUNTER — Telehealth: Payer: Self-pay | Admitting: Family Medicine

## 2008-09-03 ENCOUNTER — Encounter: Payer: Self-pay | Admitting: Family Medicine

## 2008-09-08 ENCOUNTER — Ambulatory Visit: Payer: Self-pay | Admitting: Family Medicine

## 2008-09-08 DIAGNOSIS — E663 Overweight: Secondary | ICD-10-CM | POA: Insufficient documentation

## 2008-11-30 ENCOUNTER — Encounter: Payer: Self-pay | Admitting: Family Medicine

## 2008-12-07 ENCOUNTER — Emergency Department (HOSPITAL_COMMUNITY): Admission: EM | Admit: 2008-12-07 | Discharge: 2008-12-07 | Payer: Self-pay | Admitting: Emergency Medicine

## 2008-12-12 ENCOUNTER — Telehealth (INDEPENDENT_AMBULATORY_CARE_PROVIDER_SITE_OTHER): Payer: Self-pay | Admitting: Family Medicine

## 2008-12-17 HISTORY — PX: COLONOSCOPY: SHX174

## 2008-12-30 ENCOUNTER — Encounter: Payer: Self-pay | Admitting: Family Medicine

## 2008-12-30 ENCOUNTER — Ambulatory Visit: Payer: Self-pay | Admitting: Family Medicine

## 2008-12-30 ENCOUNTER — Encounter: Payer: Self-pay | Admitting: *Deleted

## 2008-12-30 DIAGNOSIS — K7689 Other specified diseases of liver: Secondary | ICD-10-CM | POA: Insufficient documentation

## 2008-12-30 DIAGNOSIS — K56609 Unspecified intestinal obstruction, unspecified as to partial versus complete obstruction: Secondary | ICD-10-CM | POA: Insufficient documentation

## 2008-12-30 LAB — CONVERTED CEMR LAB
ALT: 30 units/L (ref 0–53)
AST: 19 units/L (ref 0–37)
Albumin: 4.3 g/dL (ref 3.5–5.2)
Alkaline Phosphatase: 78 units/L (ref 39–117)
BUN: 16 mg/dL (ref 6–23)
CO2: 24 meq/L (ref 19–32)
Calcium: 9.2 mg/dL (ref 8.4–10.5)
Chloride: 103 meq/L (ref 96–112)
Creatinine, Ser: 1.09 mg/dL (ref 0.40–1.50)
Glucose, Bld: 108 mg/dL — ABNORMAL HIGH (ref 70–99)
Potassium: 5.2 meq/L (ref 3.5–5.3)
Sodium: 141 meq/L (ref 135–145)
Total Bilirubin: 0.3 mg/dL (ref 0.3–1.2)
Total Protein: 6.8 g/dL (ref 6.0–8.3)

## 2009-01-02 ENCOUNTER — Encounter: Payer: Self-pay | Admitting: Family Medicine

## 2009-01-10 ENCOUNTER — Telehealth: Payer: Self-pay | Admitting: *Deleted

## 2009-01-25 ENCOUNTER — Encounter: Payer: Self-pay | Admitting: Family Medicine

## 2009-01-27 ENCOUNTER — Ambulatory Visit: Payer: Self-pay | Admitting: Family Medicine

## 2009-02-04 ENCOUNTER — Encounter: Payer: Self-pay | Admitting: Family Medicine

## 2009-02-14 ENCOUNTER — Telehealth: Payer: Self-pay | Admitting: Family Medicine

## 2009-03-04 ENCOUNTER — Encounter: Payer: Self-pay | Admitting: Family Medicine

## 2009-04-12 ENCOUNTER — Ambulatory Visit: Payer: Self-pay | Admitting: Family Medicine

## 2009-04-18 ENCOUNTER — Emergency Department (HOSPITAL_COMMUNITY): Admission: EM | Admit: 2009-04-18 | Discharge: 2009-04-18 | Payer: Self-pay | Admitting: Emergency Medicine

## 2009-04-22 ENCOUNTER — Ambulatory Visit: Payer: Self-pay | Admitting: Family Medicine

## 2009-05-12 ENCOUNTER — Ambulatory Visit: Payer: Self-pay | Admitting: Family Medicine

## 2009-06-16 ENCOUNTER — Telehealth: Payer: Self-pay | Admitting: Family Medicine

## 2009-06-22 ENCOUNTER — Encounter: Payer: Self-pay | Admitting: Family Medicine

## 2009-07-19 ENCOUNTER — Telehealth (INDEPENDENT_AMBULATORY_CARE_PROVIDER_SITE_OTHER): Payer: Self-pay | Admitting: *Deleted

## 2009-08-24 ENCOUNTER — Encounter: Payer: Self-pay | Admitting: Family Medicine

## 2009-08-24 ENCOUNTER — Ambulatory Visit: Payer: Self-pay | Admitting: Family Medicine

## 2009-08-24 LAB — CONVERTED CEMR LAB

## 2009-08-25 LAB — CONVERTED CEMR LAB
ALT: 33 units/L (ref 0–53)
AST: 22 units/L (ref 0–37)
Albumin: 4.2 g/dL (ref 3.5–5.2)
Alkaline Phosphatase: 66 units/L (ref 39–117)
BUN: 18 mg/dL (ref 6–23)
CO2: 20 meq/L (ref 19–32)
Calcium: 8.4 mg/dL (ref 8.4–10.5)
Chloride: 102 meq/L (ref 96–112)
Cholesterol: 127 mg/dL (ref 0–200)
Creatinine, Ser: 1.25 mg/dL (ref 0.40–1.50)
Glucose, Bld: 172 mg/dL — ABNORMAL HIGH (ref 70–99)
HCT: 48.3 % (ref 39.0–52.0)
HDL: 42 mg/dL (ref >39)
Hemoglobin: 16.7 g/dL (ref 13.0–17.0)
LDL Cholesterol: 52 mg/dL (ref 0–99)
MCHC: 34.6 g/dL (ref 30.0–36.0)
MCV: 95.1 fL (ref 78.0–100.0)
PSA: 0.45 ng/mL (ref 0.10–4.00)
Phenytoin Lvl: 27 ug/mL — ABNORMAL HIGH (ref 10.0–20.0)
Platelets: 185 10*3/uL (ref 150–400)
Potassium: 4.1 meq/L (ref 3.5–5.3)
RBC: 5.08 M/uL (ref 4.22–5.81)
RDW: 14.6 % (ref 11.5–15.5)
Sodium: 138 meq/L (ref 135–145)
Testosterone: 525.37 ng/dL (ref 350–890)
Total Bilirubin: 0.3 mg/dL (ref 0.3–1.2)
Total CHOL/HDL Ratio: 3
Total Protein: 6.7 g/dL (ref 6.0–8.3)
Triglycerides: 163 mg/dL — ABNORMAL HIGH (ref <150)
VLDL: 33 mg/dL (ref 0–40)
WBC: 6.9 10*3/uL (ref 4.0–10.5)

## 2009-08-26 ENCOUNTER — Encounter (INDEPENDENT_AMBULATORY_CARE_PROVIDER_SITE_OTHER): Payer: Self-pay | Admitting: *Deleted

## 2009-09-01 ENCOUNTER — Ambulatory Visit (HOSPITAL_COMMUNITY): Admission: RE | Admit: 2009-09-01 | Discharge: 2009-09-01 | Payer: Self-pay | Admitting: Family Medicine

## 2009-09-08 ENCOUNTER — Encounter: Payer: Self-pay | Admitting: Family Medicine

## 2009-09-19 ENCOUNTER — Telehealth: Payer: Self-pay | Admitting: Family Medicine

## 2009-10-14 ENCOUNTER — Emergency Department (HOSPITAL_COMMUNITY): Admission: EM | Admit: 2009-10-14 | Discharge: 2009-10-14 | Payer: Self-pay | Admitting: Emergency Medicine

## 2009-10-19 ENCOUNTER — Telehealth: Payer: Self-pay | Admitting: Family Medicine

## 2010-02-02 ENCOUNTER — Encounter: Payer: Self-pay | Admitting: Family Medicine

## 2010-02-02 ENCOUNTER — Ambulatory Visit: Payer: Self-pay | Admitting: Family Medicine

## 2010-02-15 LAB — CONVERTED CEMR LAB
ALT: 40 units/L (ref 0–53)
AST: 27 units/L (ref 0–37)
Albumin: 4.1 g/dL (ref 3.5–5.2)
Alkaline Phosphatase: 74 units/L (ref 39–117)
BUN: 15 mg/dL (ref 6–23)
CO2: 23 meq/L (ref 19–32)
Calcium: 8.6 mg/dL (ref 8.4–10.5)
Chloride: 104 meq/L (ref 96–112)
Creatinine, Ser: 1.09 mg/dL (ref 0.40–1.50)
Glucose, Bld: 89 mg/dL (ref 70–99)
HCV Ab: NEGATIVE
Hepatitis B Surface Ag: NEGATIVE
Phenytoin Lvl: 22.4 ug/mL — ABNORMAL HIGH (ref 10.0–20.0)
Potassium: 4.8 meq/L (ref 3.5–5.3)
Sodium: 137 meq/L (ref 135–145)
Testosterone: 104.76 ng/dL — ABNORMAL LOW (ref 350–890)
Total Bilirubin: 0.4 mg/dL (ref 0.3–1.2)
Total Protein: 6.1 g/dL (ref 6.0–8.3)

## 2010-02-16 ENCOUNTER — Telehealth: Payer: Self-pay | Admitting: Family Medicine

## 2010-04-28 ENCOUNTER — Emergency Department (HOSPITAL_COMMUNITY): Admission: EM | Admit: 2010-04-28 | Discharge: 2010-04-28 | Payer: Self-pay | Admitting: Emergency Medicine

## 2010-05-24 ENCOUNTER — Encounter: Payer: Self-pay | Admitting: Family Medicine

## 2010-07-18 ENCOUNTER — Telehealth: Payer: Self-pay | Admitting: Family Medicine

## 2010-07-18 ENCOUNTER — Encounter: Payer: Self-pay | Admitting: Family Medicine

## 2010-07-18 ENCOUNTER — Ambulatory Visit: Payer: Self-pay | Admitting: Family Medicine

## 2010-07-18 DIAGNOSIS — R319 Hematuria, unspecified: Secondary | ICD-10-CM | POA: Insufficient documentation

## 2010-07-18 LAB — CONVERTED CEMR LAB
Bilirubin Urine: NEGATIVE
Blood in Urine, dipstick: NEGATIVE
Glucose, Urine, Semiquant: NEGATIVE
Ketones, urine, test strip: NEGATIVE
Nitrite: NEGATIVE
Protein, U semiquant: NEGATIVE
Specific Gravity, Urine: 1.015
Urobilinogen, UA: 0.2
pH: 6.5

## 2010-07-19 ENCOUNTER — Encounter: Payer: Self-pay | Admitting: Family Medicine

## 2010-07-21 ENCOUNTER — Encounter: Payer: Self-pay | Admitting: Family Medicine

## 2010-07-21 ENCOUNTER — Ambulatory Visit: Payer: Self-pay | Admitting: Family Medicine

## 2010-07-21 LAB — CONVERTED CEMR LAB
BUN: 23 mg/dL (ref 6–23)
CO2: 25 meq/L (ref 19–32)
Calcium: 9 mg/dL (ref 8.4–10.5)
Chloride: 105 meq/L (ref 96–112)
Creatinine, Ser: 1.06 mg/dL (ref 0.40–1.50)
Glucose, Bld: 83 mg/dL (ref 70–99)
HCT: 45 % (ref 39.0–52.0)
Hemoglobin: 15.7 g/dL (ref 13.0–17.0)
MCHC: 34.9 g/dL (ref 30.0–36.0)
MCV: 95.5 fL (ref 78.0–100.0)
PSA: 0.98 ng/mL (ref 0.10–4.00)
Platelets: 193 10*3/uL (ref 150–400)
Potassium: 4.7 meq/L (ref 3.5–5.3)
RBC: 4.71 M/uL (ref 4.22–5.81)
RDW: 13.9 % (ref 11.5–15.5)
Sodium: 139 meq/L (ref 135–145)
WBC: 6 10*3/uL (ref 4.0–10.5)

## 2010-08-02 ENCOUNTER — Encounter: Payer: Self-pay | Admitting: Family Medicine

## 2010-08-03 ENCOUNTER — Ambulatory Visit: Payer: Self-pay | Admitting: Family Medicine

## 2010-08-03 DIAGNOSIS — R209 Unspecified disturbances of skin sensation: Secondary | ICD-10-CM | POA: Insufficient documentation

## 2010-08-23 ENCOUNTER — Telehealth: Payer: Self-pay | Admitting: Family Medicine

## 2010-08-23 ENCOUNTER — Encounter: Payer: Self-pay | Admitting: Family Medicine

## 2010-08-25 ENCOUNTER — Encounter: Payer: Self-pay | Admitting: Family Medicine

## 2010-08-28 ENCOUNTER — Ambulatory Visit: Payer: Self-pay | Admitting: Family Medicine

## 2010-08-28 ENCOUNTER — Telehealth: Payer: Self-pay | Admitting: Family Medicine

## 2010-09-20 ENCOUNTER — Encounter: Payer: Self-pay | Admitting: Family Medicine

## 2010-09-20 ENCOUNTER — Ambulatory Visit: Payer: Self-pay | Admitting: Family Medicine

## 2010-09-20 LAB — CONVERTED CEMR LAB
BUN: 18 mg/dL (ref 6–23)
CO2: 25 meq/L (ref 19–32)
Calcium: 8.7 mg/dL (ref 8.4–10.5)
Chloride: 103 meq/L (ref 96–112)
Cholesterol: 159 mg/dL (ref 0–200)
Creatinine, Ser: 0.9 mg/dL (ref 0.40–1.50)
Glucose, Bld: 107 mg/dL — ABNORMAL HIGH (ref 70–99)
HDL: 63 mg/dL (ref >39)
LDL Cholesterol: 77 mg/dL (ref 0–99)
Phenytoin Lvl: 19.4 ug/mL (ref 10.0–20.0)
Potassium: 4.4 meq/L (ref 3.5–5.3)
Sodium: 136 meq/L (ref 135–145)
Testosterone: 126.09 ng/dL — ABNORMAL LOW (ref 350–890)
Total CHOL/HDL Ratio: 2.5
Triglycerides: 97 mg/dL (ref <150)
VLDL: 19 mg/dL (ref 0–40)

## 2010-10-25 ENCOUNTER — Telehealth: Payer: Self-pay | Admitting: Sports Medicine

## 2010-10-25 ENCOUNTER — Encounter: Payer: Self-pay | Admitting: *Deleted

## 2010-10-25 ENCOUNTER — Emergency Department (HOSPITAL_COMMUNITY): Admission: EM | Admit: 2010-10-25 | Discharge: 2010-10-25 | Payer: Self-pay | Admitting: Family Medicine

## 2010-10-26 ENCOUNTER — Ambulatory Visit: Payer: Self-pay | Admitting: Family Medicine

## 2010-10-27 ENCOUNTER — Ambulatory Visit: Payer: Self-pay | Admitting: Family Medicine

## 2010-12-01 ENCOUNTER — Encounter: Payer: Self-pay | Admitting: Family Medicine

## 2011-01-16 NOTE — Progress Notes (Signed)
Summary: Requesting to speak with MD  Phone Note Call from Patient Call back at [phone]   Summary of Call: Pt is requesting to speak with MD re: appt yesterday and xray of lung. Initial call taken by: Haydee Salter,  June 25, 2008 1:46 PM  Follow-up for Phone Call        wants rx calcium so he will only have to pay $3. message to pcp Follow-up by: Golden Circle RN,  June 25, 2008 1:50 PM  Additional Follow-up for Phone Call Additional follow up Details #1::        Called pt at home.  told him i doubt he has vitamin D deficiency, especially as he does drink lots of milk and has been taking vit D supplementation for several months to years.  Pt states he will try to visit different vitamin stores to see if there is any vitamin D/Ca supplementation that is cheaper than the one he currently uses.  Medicaid does not pay for calcium/vit D as this is OTC.  Pt states he will get xray in next week to f/u on PNA. Additional Follow-up by: Eustaquio Boyden  MD,  June 25, 2008 3:06 PM

## 2011-01-16 NOTE — Assessment & Plan Note (Signed)
Summary: f/u visit-PNA   Vital Signs:  Patient profile:   55 year old male Height:      70 inches Weight:      187 pounds BMI:     26.93 O2 Sat:      96 % on Room air Temp:     98.3 degrees F oral Pulse rate:   78 / minute BP sitting:   103 / 72  (right arm) Cuff size:   regular  Vitals Entered By: Tessie Fass CMA (October 27, 2010 3:34 PM) CC: F/U   Primary Provider:  Clementeen Graham MD  CC:  F/U.  History of Present Illness: Pneumonia: Diagnosed at UC few days ago. CXR showing bilateral patchy basilar opacities and questionable RL lobar distribution. Received doxy and 1x Rocephin at UC. Followed up yesterday by PCP Dr. Denyse Amass, changed to Avelox and given another dose Rocephin. Patient states that he continued feeling bad overnight, however his symptoms are somewhat improved today as his muscle aches are gone. He has some mild dyspnea when he lies on his back. Still coughing up blood-tinged sputum and feels weak. Denies fever, chills, chest pain.   Allergies: 1)  ! Sulfa  Past History:  Past Medical History: Last updated: 05/24/2010 - Abd pain ER visit 12/07/08 - obstruction but pt left AMA with concern he would not receive depression/seizure meds correctly.  CT scan showing colon stricture/neoplasm and liver lesion, however thought to just be edema by GI and colonoscopy at f/u (2010) showed adenomatous polyp resected (rpt 5 yrs) - Depression  (see psych 4x/yr, sees therapist 6x/yr) - Schizoaffective diorder/ Schizophrenia  -ADMISSIONS to inpt psych: 1- on 09/05  depression with suicidal thoughts and plans 2-  ~ 1990 for self orchiectomy/suicidal (both admissions at [facility], [place], [place]) - Hx of paradoxical reaction to [employer] - hypogonadism - SELF ORCHIECTOMY  while psychotic (on testosterone IM q 2 wks) - Osteopenia (T -1.5 2007) 2/2 hypogonadism and dilantin - Hyperlipidemia - Hypertension - Seizures disorder (REACHES EASILY TOXIC LEVELS  of dilantin , needs  dilantin levels f/u.) Seems that alternating 150 mg / 200mg  works well 07/08 - Rhinitis, Allergic - 477.9 - Hx of bilalteral R > L  De Quervain's tenosynovitis  - Hx of  L ankle sprain  - creatinine baseline:1.1/1.2  Past Surgical History: Last updated: 09/08/2009 Bone Density T:- 1.6 - 01/31/2006; T -1.6 08/2009.  consider rpt 2012 lipid panel 2/08: TC=138, TG=91, HDL=55, LDL=65 sigmoidoscopy (nl) - 03/18/1999 colonoscopy 2010 - adenomatous polyp resected (rpt 2015)  Family History: Last updated: 12/30/2008 -DM 2 -Oteoporosis (parents and brother) - Father had CVA 11/2008  Social History: Last updated: 09/20/2010 Unemployed. Disability. Single. No children.  broke up with girlfriend (10/08) and this caused him much anxiety as he was afraid she was out to get him.  Feeling better from this now.   denies smoking, EtOH, rec drugs.  Risk Factors: Smoking Status: never (10/26/2010)  Review of Systems       The patient complains of dyspnea on exertion, prolonged cough, and hemoptysis.  The patient denies anorexia, fever, weight loss, hoarseness, chest pain, syncope, peripheral edema, headaches, abdominal pain, and muscle weakness.    Physical Exam  General:  Alert, WN, WD.  Has respiratory mask in place.  Head:  NCAT Eyes:  No conjunctival exudates, some paleness Nose:  no nasal discharge.   Mouth:  No pharyngeal exudates, some mild erythema.  Neck:  No deformities, masses, or tenderness noted. Lungs:  R>L  bibasilar rales. No wheezing or crackles. Nonlabored, no retractions.  Heart:  Normal rate and regular rhythm. S1 and S2 normal without gallop, murmur, click, rub or other extra sounds. Extremities:  No clubbing, cyanosis, edema, or deformity noted with normal full range of motion of all joints.   Neurologic:  alert & oriented X3, cranial nerves II-XII intact, strength normal in all extremities, and sensation intact to light touch.   Psych:  memory intact for recent and remote and  normally interactive.     Impression & Recommendations:  Problem # 1:  UNSPECIFIED BACTERIAL PNEUMONIA (ICD-482.9) Will continue current managment as patient seems to be improving. Stable on room air. Advised to follow up with Dr. Denyse Amass in next 2 weeks or sooner if he develops recurrent fevers, increased work of breathing, or chest pain. CXR showed questionable superimposed scarring, likely to benefit from repeat imaging in 6-8 weeks to insure clearing of pulmonary infection and exclude underlying pathology.   His updated medication list for this problem includes:    Avelox 400 Mg Tabs (Moxifloxacin hcl) .Marland Kitchen... 1 by mouth daily  Orders: [facility]- Est Level  3 (16109)  Complete Medication List: 1)  Klonopin 2 Mg Tabs (Clonazepam) .... Take 2 and a half tablets by mouth twice a day. 2)  Toprol Xl 200 Mg Tb24 (Metoprolol succinate) .Marland Kitchen.. 1 by mouth daily 3)  Zocor 40 Mg Tabs (Simvastatin) .... Take 1 tablet by mouth at bedtime 4)  Depo-testosterone 200 Mg/ml Oil (Testosterone cypionate) .... Inject 400 mg im every 2 weeks 5)  Prilosec Otc 20 Mg Tbec (Omeprazole magnesium) .... One daily 6)  Seroquel 300 Mg Tabs (Quetiapine fumarate) .... Take three by mouth daily 7)  Doxepin Hcl 100 Mg Caps (Doxepin hcl) .... Take nine by mouth daily 8)  Dilantin Infatabs 50 Mg Chew (Phenytoin) .... Take four by mouth once daily 9)  Oscal 500/200 D-3 500-200 Mg-unit Tabs (Calcium-vitamin d) .... Take one daily 10)  Docusate Sodium 100 Mg Caps (Docusate sodium) .... Take one by mouth daily 11)  Miralax Powd (Polyethylene glycol 3350) .Marland KitchenMarland Kitchen. 17 gm in 8 oz fluid qdaily as needed constipation 12)  Monoject Syringe 23g X 1" 3 Ml Misc (Syringe/needle (disp)) .... Use as directed.  qs 1 month 13)  Cortisporin 3.5-10000-1 Soln (Neomycin-polymyxin-hc) .... 3 drops in right ear three times a day for 7 days 14)  Avelox 400 Mg Tabs (Moxifloxacin hcl) .Marland Kitchen.. 1 by mouth daily  Patient Instructions: 1)  Nice to meet you. 2)  Keep  taking your antibiotics. 3)  Drink plenty of fluids in the next several days. 4)  Please return to clinic to see Dr. Denyse Amass in next 2 weeks.    Orders Added: 1)  [facility]- Est Level  3 [60454]

## 2011-01-16 NOTE — Progress Notes (Signed)
Summary: wi request  Phone Note Call from Patient Call back at [facility] Phone [phone]   Summary of Call: Pt has concerns regarding results of measures of kidney fx.  Requesting info regarding comparison to others for same test.  Please call pt at  [phone]   Initial call taken by: Britta Mccreedy mcgregor  Follow-up for Phone Call        pt calling again, wants to know if he can come for lab work, sts he has blood in his urine. Follow-up by: ERIN LEVAN,  February 11, 2008 9:26 AM  Additional Follow-up for Phone Call Additional follow up Details #1::        states he saw "dark urine" thinks it was blood. worse in am when he gets up. explained how urine is concentrated 1st thing and gets more dilute with drinking during the day. he denies pain or burning. states his urine is light yellow now. drinks little during the day except for a glass of fluids at meals. urged him to try getting 8 glasses of fluid per day in addition to his usual beverages. he decided to try this and not make an appt. urged him to call back if he saw red blood or had pain or burning,fever or other concerns.  Additional Follow-up by: Golden Circle RN,  February 11, 2008 9:37 AM    Thank you Kennon Rounds.  Will continue to monitor. ...................................................................Eustaquio Boyden  MD  February 12, 2008 1:28 PM

## 2011-01-16 NOTE — Letter (Signed)
Summary: Results Follow-up Letter  [facility]  [address]   [place], [place] [address]   Phone: [phone]  Fax: [phone]    01/02/2009  [address] [place], [place]  [address]  Dear Mr. FALERO,   The following are the results of your recent test(s):  Test     Result     Your labwork at our office on 12/30/08 was normal.  Your kidney function as well as your liver function looked great.  Please call clinic with any questions. _________________________________________________________  Sincerely,  Eustaquio Boyden  MD [facility]           Appended Document: Results Follow-up Letter sent

## 2011-01-16 NOTE — Progress Notes (Signed)
Summary: cxl appt  Phone Note Call from Patient Call back at Home Phone [phone]   Caller: Patient Summary of Call: can not come to appt for today - pls call to resch no sooner than Wednesday Initial call taken by: De Nurse,  February 14, 2009 9:46 AM    Pt not sure of schedule; needs to contact psychiatrist b/c he is having pblms right now, and believes he may need hospitalization.  Will call to R/S when he can.   Wyona Almas, PhD, RD

## 2011-01-16 NOTE — Assessment & Plan Note (Signed)
Summary: [name] referral / [facility]   Vital Signs:  Patient Profile:   55 Years Old Male Height:     70 inches Weight:      188.1 pounds BMI:     27.09  Vitals Entered By: Wyona Almas PHD (January 27, 2009 11:21 AM)                 PCP:  Eustaquio Boyden  MD   History of Present Illness: Assessment:  [name] is concerned re. his weight gain, constipation, and fam hx of DM and stroke.  He has great difficulty maintaining motivation for exercise or for dietary change b/c of his severe depression.  Usual eating pattern includes 2-3 meals/day, and is very erratic.  Veg's and fruit are seldom eaten b/c there are few he likes; a typical meal consists of only instant potatoes or stuffing.  [name] consumes at least 4 cups of 2% milk/day.  He spends most of his time in bed w/ a computer; goes out for appts, errands, or to visit parents 2-3 X wk.    Nutrition Diagnosis: Excessive energy intake (NI-1.5) related to physical activity as evidenced by recent weight gain.  Imbalance of nutrients (NI5.5) related to lack of veg's and fruits as evidenced by meals consisting of only potatoes or other starch.  Inadequate fiber intake (NI-53.5) related to paucity of fruits and veg's as evidenced by chronic constipation.  Physical inactivity (NB-2.1) related to depression as evidenced by pt report of no exercise on any days.    Intervention:  1. Activity:  On days when leaving the house, plan to walk at least a few minutes.  [name] agreed that this would be easier than on days when he has no momentum to already be out.  He committed to walking at least a few minutes today before driving home.  2. Veg's:  Suggested trying some frozen veg's with seasoning/sauces or raw veg's with low-fat dips or dressings.  Discussed concept of taste preferences being learned, and he is willing to give it a try.    Monitoring/Eval: Dietary intake, body weight, and exercise at 3-wk F/U.       Current  Allergies: ! SULFA        Complete Medication List: 1)  Klonopin 2 Mg Tabs (Clonazepam) .... Take 2 and a half tablets by mouth twice a day 2)  Toprol Xl 200 Mg Tb24 (Metoprolol succinate) .Marland Kitchen.. 1 by mouth daily 3)  Zocor 40 Mg Tabs (Simvastatin) .... Take 1 tablet by mouth at bedtime 4)  Depo-testosterone 200 Mg/ml Oil (Testosterone cypionate) .... Inject 400 mg im every 2 weeks 5)  Prilosec Otc 20 Mg Tbec (Omeprazole magnesium) .... One daily 6)  Seroquel 300 Mg Tabs (Quetiapine fumarate) .... Take two by mouth daily 7)  Doxepin Hcl 100 Mg Caps (Doxepin hcl) .... Take nine by mouth daily 8)  Oscal 500/200 D-3 500-200 Mg-unit Tabs (Calcium-vitamin d) .... Take one daily 9)  Dilantin Infatabs 50 Mg Chew (Phenytoin) .... Take four by mouth once daily 10)  Docusate Sodium 100 Mg Caps (Docusate sodium) .... Take one by mouth daily 11)  Miralax Powd (Polyethylene glycol 3350) .Marland KitchenMarland Kitchen. 17 gm in 8 oz fluid qdaily as needed constipation

## 2011-01-16 NOTE — Assessment & Plan Note (Signed)
Summary: F/U per [name] / [facility]   Vital Signs:  Patient profile:   55 year old male Height:      70 inches Weight:      181.8 pounds BMI:     26.18  Vitals Entered By: Wyona Almas PHD (April 12, 2009 2:51 PM)  History of Present Illness: Assessment:  Pt ws seen for 60 minutes.  24-hour recall:  12 oz. 2% milk.  [name] has not been eating much lately b/c of worries re. his dad, who is quite debilitated from a stroke, and his mom, who also is increasingly frail.  He brings them [employer] dinner 4 X wk, and on those days he will eat [employer] grilled salmon, coleslaw, and blueberry pie.  He has not exercised since his last appt, when he did walk a few minutes following his appt.  [name] is clearly feeling overwhelmed with his parents' health problems at this time, but he remains very concerned re. his own weight and health status as well.  Motivation is high, but support for needed behavior changes is limited.    Nutrition Diagnosis:  Excessive energy intake (NI-1.5) is now changed to inadequate energy intake (NI-1.4) related to depression as evidenced by several days recently of no intake other than 1 glass of milk.  Imbalance of nutrients (NI5.5) related to lack of veg's and fruits as evidenced by severe restriction of late. Inadequate fiber intake (NI-53.5) related to paucity of fruits and veg's as evidenced by chronic constipation.  Physical inactivity (NB-2.1) related to depression as evidenced by pt report of no exercise on any days.    Monitoring/Evaluation:  Dietary intake, body weight, and exercise at 4-wk F/U.    Allergies: 1)  ! Sulfa   Complete Medication List: 1)  Klonopin 2 Mg Tabs (Clonazepam) .... Take 2 and a half tablets by mouth twice a day 2)  Toprol Xl 200 Mg Tb24 (Metoprolol succinate) .Marland Kitchen.. 1 by mouth daily 3)  Zocor 40 Mg Tabs (Simvastatin) .... Take 1 tablet by mouth at bedtime 4)  Depo-testosterone 200 Mg/ml Oil (Testosterone cypionate) .... Inject 400 mg im every 2  weeks 5)  Prilosec Otc 20 Mg Tbec (Omeprazole magnesium) .... One daily 6)  Seroquel 300 Mg Tabs (Quetiapine fumarate) .... Take two by mouth daily 7)  Doxepin Hcl 100 Mg Caps (Doxepin hcl) .... Take nine by mouth daily 8)  Oscal 500/200 D-3 500-200 Mg-unit Tabs (Calcium-vitamin d) .... Take one daily 9)  Dilantin Infatabs 50 Mg Chew (Phenytoin) .... Take four by mouth once daily 10)  Docusate Sodium 100 Mg Caps (Docusate sodium) .... Take one by mouth daily 11)  Miralax Powd (Polyethylene glycol 3350) .Marland KitchenMarland Kitchen. 17 gm in 8 oz fluid qdaily as needed constipation  Other Orders: Reassessment Each 15 min unit- [facility] ([address])  Patient Instructions: 1)  Switch to 1% milk.  If you choose to use soymilk, look for at least 7 grams of protein per 8 oz.  Remember: Taste preferences are learned!   2)  Smoothies:  Use low- or non-fat yogurt, banana, other fruits, or if you do use juice or water in place of yogurt, add 1-2 scoops of whey protein powder to balance protein with carbohydrate.   3)  Read food labels on yogurts:  Compare sugar contents, and go with the lower sugar and lower fats when possible.   4)  Try a soup from recipe prvided (freeze leftovers).   5)  In place of mashed  potatoes from potato flakes, substitute pasta (any shape) with a small amount of olive oil and garlic.   6)  Another good replacement for potatoes is cooked, pureed cauliflower along with some olive oil (and garlic too).  Cook cauliflower in low-sodium chicken broth [employer]" brand has a  very low-sodium version).   7)  On days you are planning to go out, incorporate some walking to your day - at the mall or [employer] on hot days - even if it's just 5 minutes! 8)  Follow up with me in 1 month:  May 27 at 3:00 PM.

## 2011-01-16 NOTE — Assessment & Plan Note (Signed)
Summary: feet are numb/[name]   Vital Signs:  Patient profile:   55 year old male Weight:      182.3 pounds Temp:     98.9 degrees F oral Pulse rate:   81 / minute Pulse rhythm:   regular BP sitting:   101 / 68  (left arm) Cuff size:   regular  Vitals Entered By: Loralee Pacas CMA (August 03, 2010 4:05 PM)  Primary Care Provider:  Clementeen Graham MD   History of Present Illness: 1.  Feet numb:  Patient states that about 5 weeks ago he noticed that he could not feel fan blowing against his feet at night like he usually could.  Was worried that he was having loss of sensation in his feet.  States he felt like "latex glove" was over his feet.  Could still feel sharp sensation or temperature.  Last night, he states that he could feel normally in his feet when he had on his pajamas, could feel part of his legs where pajamas ended.  States loss of sensation has resolved.    ROS:  no polydipsia, polyuria, no paresthesias, loss of coordination, gait instability  Current Problems (verified): 1)  Paresthesia  (ICD-782.0) 2)  Hematuria Unspecified  (ICD-599.70) 3)  Dysuria  (ICD-788.1) 4)  Hypertension  (ICD-401.9) 5)  Hyperlipidemia  (ICD-272.4) 6)  Depression, Chronic, Severe  (ICD-296.33) 7)  Seizure Disorder, Hx of  (ICD-V12.49) 8)  Aftercare, Long-term Use, Medications Nec  (ICD-V58.69) 9)  Hypogonadism, Male S/p Self-orchiectomy  (ICD-257.2) 10)  Osteopenia  (ICD-733.90) 11)  Other Specified Disorders of Liver  (ICD-573.8) 12)  Unspecified Intestinal Obstruction  (ICD-560.9) 13)  Overweight  (ICD-278.02) 14)  Shoulder Pain, Right  (ICD-719.41) 15)  Gerd  (ICD-530.81) 16)  Preventive Health Care  (ICD-V70.0)  Current Medications (verified): 1)  Klonopin 2 Mg Tabs (Clonazepam) .... Take 2 and A Half Tablets By Mouth Twice A Day 2)  Toprol Xl 200 Mg  Tb24 (Metoprolol Succinate) .Marland Kitchen.. 1 By Mouth Daily 3)  Zocor 40 Mg Tabs (Simvastatin) .... Take 1 Tablet By Mouth At Bedtime 4)   Depo-Testosterone 200 Mg/ml  Oil (Testosterone Cypionate) .... Inject 400 Mg Im Every 2 Weeks 5)  Prilosec Otc 20 Mg  Tbec (Omeprazole Magnesium) .... One Daily 6)  Seroquel 300 Mg  Tabs (Quetiapine Fumarate) .... Take Three By Mouth Daily 7)  Doxepin Hcl 100 Mg  Caps (Doxepin Hcl) .... Take Nine By Mouth Daily 8)  Dilantin Infatabs 50 Mg  Chew (Phenytoin) .... Take Four By Mouth Once Daily 9)  Oscal 500/200 D-3 500-200 Mg-Unit  Tabs (Calcium-Vitamin D) .... Take One Daily 10)  Docusate Sodium 100 Mg Caps (Docusate Sodium) .... Take One By Mouth Daily 11)  Miralax  Powd (Polyethylene Glycol 3350) .Marland KitchenMarland KitchenMarland Kitchen 17 Gm in 8 Oz Fluid Qdaily As Needed Constipation 12)  Monoject Syringe 23g X 1" 3 Ml Misc (Syringe/needle (Disp)) .... Use As Directed.  Qs 1 Month 13)  Macrobid 100 Mg Caps (Nitrofurantoin Monohyd Macro) .Marland Kitchen.. 1 Cap By Mouth Two Times A Day For 14 Days  Allergies (verified): 1)  ! Sulfa  Physical Exam  General:  Vital signs reviewed. Well-developed, well-nourished patient in NAD.  Awake and cooperative  Lungs:  clear to auscultation bilaterally without wheezing, rales, or rhonchi.  Normal work of breathing  Heart:  Regular rate and rhythm without murmur, rub, or gallop.  Normal S1/S2  Abdomen:  soft/nontender/nondistended.  No organomegaly, bowel sounds present.  Pulses:  DP pulses present  BL Extremities:  No clubbing, cyanosis, edema, or deformity noted with normal full range of motion of all joints.   Neurologic:  No focal deficits.  Specifically strength normal in all extremities, sensation intact to light touch, and sensation intact to pinprick bilateral lower extremities.     Impression & Recommendations:  Problem # 1:  PARESTHESIA (ICD-782.0) Assessment New Reviewed chart and patient's recent labwork.  Does not seem to have diabetes.  Paresthesias have now resolved, unclear etiology for this.  Gave reassurance to patient especially as circulation, sensation, and motor function  are fully intact.  Told him to make a follow-up appointment if necessary.   Orders: [facility]- Est Level  3 (96295)  Complete Medication List: 1)  Klonopin 2 Mg Tabs (Clonazepam) .... Take 2 and a half tablets by mouth twice a day 2)  Toprol Xl 200 Mg Tb24 (Metoprolol succinate) .Marland Kitchen.. 1 by mouth daily 3)  Zocor 40 Mg Tabs (Simvastatin) .... Take 1 tablet by mouth at bedtime 4)  Depo-testosterone 200 Mg/ml Oil (Testosterone cypionate) .... Inject 400 mg im every 2 weeks 5)  Prilosec Otc 20 Mg Tbec (Omeprazole magnesium) .... One daily 6)  Seroquel 300 Mg Tabs (Quetiapine fumarate) .... Take three by mouth daily 7)  Doxepin Hcl 100 Mg Caps (Doxepin hcl) .... Take nine by mouth daily 8)  Dilantin Infatabs 50 Mg Chew (Phenytoin) .... Take four by mouth once daily 9)  Oscal 500/200 D-3 500-200 Mg-unit Tabs (Calcium-vitamin d) .... Take one daily 10)  Docusate Sodium 100 Mg Caps (Docusate sodium) .... Take one by mouth daily 11)  Miralax Powd (Polyethylene glycol 3350) .Marland KitchenMarland Kitchen. 17 gm in 8 oz fluid qdaily as needed constipation 12)  Monoject Syringe 23g X 1" 3 Ml Misc (Syringe/needle (disp)) .... Use as directed.  qs 1 month 13)  Macrobid 100 Mg Caps (Nitrofurantoin monohyd macro) .Marland Kitchen.. 1 cap by mouth two times a day for 14 days

## 2011-01-16 NOTE — Assessment & Plan Note (Signed)
Summary: MEDS/KH   Vital Signs:  Patient profile:   55 year old male Height:      70 inches Weight:      186 pounds BMI:     26.78 Pulse rate:   84 / minute BP sitting:   106 / 78  (left arm) Cuff size:   regular  Vitals Entered By: Dennison Nancy RN CC: Med refills Is Patient Diabetic? No Pain Assessment Patient in pain? no        Primary Care Provider:  Eustaquio Boyden  MD  CC:  Med refills.  History of Present Illness: CC: med visit - labwork  55yo with Depression, seizure d/o on longterm care meds presents for f/u meds.  1. hypogonadism from self orchiectomy - testosterone 400mg  IM Q2wks.  PSA elevated last visit to 3.58 2009, was to f/u with urology, no records available.  Pt asks about testosterone implant instead of IM shot (worried about peak after shot).  Denies sxs of BPH with stream.  2. seizures - seuzire free. on klonopin, dilantin.  Needs bone scan as on chornic dilantin.  3. psych - h/o depression with psychosis and schizophrenia/schizoaffective d/o.  On doxepin, seroquel, klonopin.  to see Dr. Ilsa Iha tomorrow.  Due for tetanus today.  Habits & Providers  Alcohol-Tobacco-Diet     Tobacco Status: never     Gastroenterologist: Meyer Russel     Neurologist: Anne Hahn     Psychiatrist: Ilsa Iha at [facility]  Current Medications (verified): 1)  Klonopin 2 Mg Tabs (Clonazepam) .... Take 2 and A Half Tablets By Mouth Twice A Day 2)  Toprol Xl 200 Mg  Tb24 (Metoprolol Succinate) .Marland Kitchen.. 1 By Mouth Daily 3)  Zocor 40 Mg Tabs (Simvastatin) .... Take 1 Tablet By Mouth At Bedtime 4)  Depo-Testosterone 200 Mg/ml  Oil (Testosterone Cypionate) .... Inject 400 Mg Im Every 2 Weeks 5)  Prilosec Otc 20 Mg  Tbec (Omeprazole Magnesium) .... One Daily 6)  Seroquel 300 Mg  Tabs (Quetiapine Fumarate) .... Take Two By Mouth Daily 7)  Doxepin Hcl 100 Mg  Caps (Doxepin Hcl) .... Take Nine By Mouth Daily 8)  Oscal 500/200 D-3 500-200 Mg-Unit  Tabs  (Calcium-Vitamin D) .... Take One Daily 9)  Dilantin Infatabs 50 Mg  Chew (Phenytoin) .... Take Four By Mouth Once Daily 10)  Docusate Sodium 100 Mg Caps (Docusate Sodium) .... Take One By Mouth Daily 11)  Miralax  Powd (Polyethylene Glycol 3350) .Marland KitchenMarland KitchenMarland Kitchen 17 Gm in 8 Oz Fluid Qdaily As Needed Constipation  Allergies (verified): 1)  ! Sulfa  Past History:  Social History: Last updated: 05/21/2008 Unemployed. Disability. Single. No children.  broke up with girlfriend (10/08) and this caused him much anxiety as he was afraid she was out to get him.  Feeling better from this now. (2/09)  denies smoking, EtOH, rec drugs.  Past Medical History: - Abd pain ER visit 12/07/08 - obstruction but pt left AMA with concern he would not receive depression/seizure meds correctly.  CT scan showing colon stricture/neoplasm and liver lesion, colonoscopy 2010 adenomatous polyp resected (rpt 5 yrs) - Depression  (see psych 4x/yr, sees therapist 6x/yr) - Schizoaffective diorder/ Schizophrenia  -ADMISSIONS 2 TO PSYCHE issues x 2 : 1- on 09/05  depression with suicidal thoughts and plans 2-  ~ 1990 for self orchiectomy/suicidal (both admissions at [facility], [place], [place]) - Hx of paradoxical reactions to [employer] - hypogonadism - SELF ORCHIECTOMY  while psychotic (on testosterone IM q 2 wks) -  Osteopenia (T -1.5 2007) 2/2 hypogonadism and dilantin - Hyperlipidemia - Hypertension - Seizures disorder (REACHES EASILY  TOXIC LEVELS  of dilantin , needs dilantin levels f/u.) Seems that alternating 150 mg / 200mg  works well 07/08 - Rhinitis, Allergic - 477.9 - Hx of bilalteral R > L  De Quervain's tenosynovitis  - Hx of  L ankle sprain  - creatinine baseline:1.1/1.2  Past Surgical History: -Bone Density T:- 1.6 - 01/31/2006  ****REAT BONE DENSITY ON 02/09 **** - lipid panel 2/08: TC=138, TG=91, HDL=55, LDL=65 -  - sigmoidoscopy (nl) - 03/18/1999 - colonoscopy 2010 - adenomatous polyp resected (rpt  2015)  Physical Exam  General:  Well appearing, NAD Lungs:  Normal respiratory effort, chest expands symmetrically. Lungs are clear to auscultation, no crackles or wheezes. Heart:  Normal rate and regular rhythm. S1 and S2 normal without gallop, murmur, click, rub or other extra sounds. Rectal:  No external abnormalities noted. Normal sphincter tone. No rectal masses or tenderness. Prostate:  some gland enlargement, no nodularities or irregularities   Impression & Recommendations:  Problem # 1:  AFTERCARE, LONG-TERM USE, MEDICATIONS NEC (ICD-V58.69)  needs several blood levels checked, which I will do today.  Will fax results to Dr. Ilsa Iha, Psychiatry.  Orders: Dilantin-[facility] ([id number]) PSA-[facility] [phone]) Testosterone-[facility] [phone]) CBC-[facility] (57846) [facility]- Est  Level 4 (96295)  Problem # 2:  HYPERLIPIDEMIA (ICD-272.4) Assessment: Unchanged FLP today. His updated medication list for this problem includes:    Zocor 40 Mg Tabs (Simvastatin) .Marland Kitchen... Take 1 tablet by mouth at bedtime  Orders: Lipid-[facility] ([id number]) [facility]- Est  Level 4 (02725)  Problem # 3:  HYPERTENSION (ICD-401.9) Assessment: Unchanged  discussed decreasing Toprol XL, which patient somewhat hesitant to do now.  Discussed waiting one more visit if still low (today 106 sbp) and normal HR (today 80), will decrease to 100mg  daily. Orders: Comp Met-[facility] ([id number]) [facility]- Est  Level 4 (25956)  His updated medication list for this problem includes:    Toprol Xl 200 Mg Tb24 (Metoprolol succinate) .Marland Kitchen... 1 by mouth daily  Problem # 4:  DEPRESSION, CHRONIC, SEVERE (ICD-296.33) continue meds.  defer to psych.  Problem # 5:  HYPOGONADISM, MALE S/P SELF-ORCHIECTOMY (ICD-257.2)  check testosterone.  then consider implant.  If PSA elevated, consider referral to urology.  prostate exam today slightly enlarged.  Orders: [facility]- Est  Level 4 (99214)  Problem # 6:  SEIZURE DISORDER, HX OF (ICD-V12.49) continue meds.    Orders: Dilantin-[facility] ([id number]) [facility]- Est  Level 4 (51884)  Problem # 7:  OSTEOPENIA (ICD-733.90)  check DEXA scan soon.  His updated medication list for this problem includes:    Oscal 500/200 D-3 500-200 Mg-unit Tabs (Calcium-vitamin d) .Marland Kitchen... Take one daily  His updated medication list for this problem includes:    Oscal 500/200 D-3 500-200 Mg-unit Tabs (Calcium-vitamin d) .Marland Kitchen... Take one daily  Complete Medication List: 1)  Klonopin 2 Mg Tabs (Clonazepam) .... Take 2 and a half tablets by mouth twice a day 2)  Toprol Xl 200 Mg Tb24 (Metoprolol succinate) .Marland Kitchen.. 1 by mouth daily 3)  Zocor 40 Mg Tabs (Simvastatin) .... Take 1 tablet by mouth at bedtime 4)  Depo-testosterone 200 Mg/ml Oil (Testosterone cypionate) .... Inject 400 mg im every 2 weeks 5)  Prilosec Otc 20 Mg Tbec (Omeprazole magnesium) .... One daily 6)  Seroquel 300 Mg Tabs (Quetiapine fumarate) .... Take two by mouth daily 7)  Doxepin Hcl 100 Mg Caps (Doxepin hcl) .... Take nine by mouth daily 8)  Oscal  500/200 D-3 500-200 Mg-unit Tabs (Calcium-vitamin d) .... Take one daily 9)  Dilantin Infatabs 50 Mg Chew (Phenytoin) .... Take four by mouth once daily 10)  Docusate Sodium 100 Mg Caps (Docusate sodium) .... Take one by mouth daily 11)  Miralax Powd (Polyethylene glycol 3350) .Marland KitchenMarland Kitchen. 17 gm in 8 oz fluid qdaily as needed constipation  Other Orders: Tdap => 30yrs IM (75643) Admin 1st Vaccine (32951) Admin 1st Vaccine [facility]) [phone])  Patient Instructions: 1)  Please return in 3 months for f/u.   2)  We will send you results of blood work and send to Dr. Ilsa Iha.  3)  We have drawn blood today. 4)  Call clinic with questions.  Pleasure to see you today.   Prevention & Chronic Care Immunizations   Influenza vaccine: Fluvax Non-MCR  (09/10/2008)   Influenza vaccine due: 10/28/2008    Tetanus booster: 08/24/2009: Tdap   Tetanus booster due: 08/25/2019    Pneumococcal vaccine: Not documented  Colorectal  Screening   Hemoccult: Done.  (11/16/2005)   Hemoccult action/deferral: Not indicated  (08/24/2009)   Hemoccult due: Not Indicated    Colonoscopy: normal  (02/04/2009)   Colonoscopy action/deferral: Repeat colonoscopy in 5 years.  miralax 17gm in 8 oz water 1-4 times per day for constipation  (02/04/2009)   Colonoscopy due: 02/04/2014  Other Screening   PSA: 3.58  (07/27/2008)   PSA ordered.   PSA action/deferral: Discussed-PSA requested  (08/24/2009)   PSA due due: 07/27/2009   Smoking status: never  (08/24/2009)  Lipids   Total Cholesterol: 160  (01/28/2008)   Lipid panel action/deferral: Lipid Panel ordered   LDL: 84  (01/28/2008)   LDL Direct: Not documented   HDL: 56  (01/28/2008)   Triglycerides: 102  (01/28/2008)    SGOT (AST): 19  (12/30/2008)   BMP action: Ordered   SGPT (ALT): 30  (12/30/2008) CMP ordered    Alkaline phosphatase: 78  (12/30/2008)   Total bilirubin: 0.3  (12/30/2008)    Lipid flowsheet reviewed?: Yes   Progress toward LDL goal: At goal  Hypertension   Last Blood Pressure: 106 / 78  (08/24/2009)   Serum creatinine: 1.09  (12/30/2008)   BMP action: Ordered   Serum potassium 5.2  (12/30/2008) CMP ordered     Hypertension flowsheet reviewed?: Yes   Progress toward BP goal: At goal  Self-Management Support :   Personal Goals (by the next clinic visit) :      Personal LDL goal: 130  (08/24/2009)    Hypertension self-management support: Not documented    Lipid self-management support: Not documented      Tetanus/Td Vaccine    Vaccine Type: Tdap    Site: right deltoid    Mfr: Sanofi Pasteur    Dose: 0.5 ml    Route: IM    Given by: Terese Door    Exp. Date: 10/14/2010    Lot #: [id number]    VIS given: 11/04/07 version given August 24, 2009.

## 2011-01-16 NOTE — Consult Note (Signed)
Summary: [facility] - Continue Dilantin 200mg  QD  [facility]   Imported By: Bradly Bienenstock 12/15/2008 15:22:13  _____________________________________________________________________  External Attachment:    Type:   Image     Comment:   External Document

## 2011-01-16 NOTE — Progress Notes (Signed)
Summary: Rx refill (klonopin and testosterone)  Phone Note Call from Patient Call back at [facility] Phone [phone]   Summary of Call: Pt is needing a refill on testosterone and klonopin - Pt uses [facility]. Initial call taken by: Haydee Salter,  June 16, 2008 11:13 AM  Follow-up for Phone Call        Will forward to MD Follow-up by: ASHA BENTON LPN,  June 17, 6143 11:16 AM  Additional Follow-up for Phone Call Additional follow up Details #1::        refilled klonopin.  plz inform patient. Additional Follow-up by: Eustaquio Boyden  MD,  June 17, 2008 7:12 PM    Additional Follow-up for Phone Call Additional follow up Details #2::    pt states pharmacy never received klonopin....he'd like it resent asap. Follow-up by: Haydee Salter,  June 21, 2008 1:43 PM  Additional Follow-up for Phone Call Additional follow up Details #3:: Details for Additional Follow-up Action Taken: Called rxs into pharmacy - pt notified. Additional Follow-up by: AMY MARTIN RN,  June 21, 2008 2:43 PM    Prescriptions: DEPO-TESTOSTERONE 200 MG/ML  OIL (TESTOSTERONE CYPIONATE) inject 400 mg IM every 2 weeks  #4 x 3   Entered and Authorized by:   Eustaquio Boyden  MD   Signed by:   Eustaquio Boyden  MD on 06/17/2008   Method used:   Electronically sent to ...       [facility]*       [address]       [place], [place]  [id number]       Ph: [phone]       Fax: [phone]   RxID:   [id number] KLONOPIN 2 MG TABS (CLONAZEPAM) Take 2 and a half tablets by mouth twice a day  #75 x 1   Entered and Authorized by:   Eustaquio Boyden  MD   Signed by:   Eustaquio Boyden  MD on 06/17/2008   Method used:   Electronically sent to ...       [facility]*       [address]       [place], [place]  [id number]       Ph: [phone]       Fax: [phone]   RxID:   [id number]

## 2011-01-16 NOTE — Letter (Signed)
Summary: Generic Letter  [facility]  [address]   [place], [place] [address]   Phone: [phone]  Fax: [phone]    08/23/2010  [address] [place], [place]  [address]  Dear Mr. DUBIE,   I need to see you in clinic before I can write a prescription for Klonopin. Please make an appointment with me soon.        Sincerely,   Clementeen Graham MD

## 2011-01-16 NOTE — Progress Notes (Signed)
Summary: Triage  Phone Note Call from Patient Call back at Home Phone [phone]   Summary of Call: Wants to speak with a nurse about having pneumonia. Initial call taken by: Haydee Salter,  Apr 28, 2008 9:20 AM  Follow-up for Phone Call        thinks he has it . sob, congested. appt at 1:30 today Follow-up by: Golden Circle RN,  Apr 28, 2008 10:21 AM

## 2011-01-16 NOTE — Letter (Signed)
Summary: Results Follow-up Letter  [facility]  [address]   [place], [place] [address]   Phone: [phone]  Fax: [phone]    02/01/2008  [address] [place], [place]  [address]  Dear Mr. WARMUTH,   The following are the results of your recent test(s):  Test     Result     _________________________________________________________ Cholesterol LDL(Bad cholesterol):     84     Your goal is less than: 130      HDL (Good cholesterol):   56     Your goal is more than: 50 _________________________________________________________ Other Tests: All your blood work returned normal and healthy.  Your blood glucose level, which is a test for diabetes, was normal.  Your creatinine, a measure of your kidney function, was 1.06, which is normal for you.  Your potassium level was normal.  Your calcium was normal as well.  Your dilantin and testosterone levels were in the middle of the normal range for these values.  _________________________________________________________  Please bring all your medication bottles to your next visit so we can ensure our records are correct.  On your next visit I would like to discuss your bone health, along with possibly repeating a bone scan.  Hope you have a wonderful rest of February.  Sincerely,  Eustaquio Boyden  MD [facility]  Appended Document: Results Follow-up Letter Letter mailed

## 2011-01-16 NOTE — Assessment & Plan Note (Signed)
Summary: resp illness   Vital Signs:  Patient Profile:   55 Years Old Male Height:     70 inches Weight:      179.7 pounds Temp:     98.6 degrees F Pulse rate:   83 / minute BP sitting:   122 / 77  (left arm)  Pt. in pain?   no  Vitals Entered By: Jacki Cones RN (Apr 28, 2008 1:36 PM)                  PCP:  Eustaquio Boyden  MD  Chief Complaint:  cough and and left sided pain with cough.  History of Present Illness: [name] is a 55 yo male who comes in as a work in for cough and left sided pain with cough.  States he has had a fever with Tmax o 101.5, productive cough (greenish sputum), malaise and left sided pain with cough for 5 days.  No sick contacts.  No nausea, vomitting, or diarrhea.  H/o of pneumonia in the past.  Not currently smoking.    Updated Prior Medication List: KLONOPIN 2 MG TABS (CLONAZEPAM) Take 2 and a half tablets by mouth twice a day TOPROL XL 200 MG  TB24 (METOPROLOL SUCCINATE) 1 by mouth daily ZOCOR 40 MG TABS (SIMVASTATIN) Take 1 tablet by mouth at bedtime DEPO-TESTOSTERONE 200 MG/ML  OIL (TESTOSTERONE CYPIONATE) inject 400 mg IM every 2 weeks PRILOSEC OTC 20 MG  TBEC (OMEPRAZOLE MAGNESIUM) one daily SEROQUEL 300 MG  TABS (QUETIAPINE FUMARATE) take two by mouth daily DOXEPIN HCL 100 MG  CAPS (DOXEPIN HCL) take nine by mouth daily OSCAL 500/200 D-3 500-200 MG-UNIT  TABS (CALCIUM-VITAMIN D) take one daily DILANTIN 100 MG  CAPS (PHENYTOIN SODIUM EXTENDED) take 4 by mouth daily  Current Allergies (reviewed today): ! SULFA  Past Medical History:    Reviewed history from 07/10/2007 and no changes required:       -Depression  (see psych 4x/yr, sees therapist 6x/yr)       - Schizoaffective diorder/ Schizophrenia        -ADMISSIONS 2 TO PSYCHE issues x 2 :                                                                        1- on 09/05  depression with suicidal thoughts and plans      2-  ~ 1990 for self orchiectomy/suicidal                                                                        both admissions at [facility], [place], [place]       - Hx of paradoxical reactions to [employer]              - hypogonadism - SELF ORCHIECTOMY  while psychotic yr ( on testosterone IM q 2 wks)       -Osteopenia  2 to hypogonadism and dilantin (REPEAT BONE DENSITY ON  01/2008)              -  Hyperlipidemia       -Hypertension              -Seizures disorder ( REACHS EASILY  TOXIC LEVELS  of dilantin , needs dilantin levels f/u . Seems that alternating 150 mg / 200mg  works well 07/08)              - Rhinitis, Allergic - 477.9              - Hx of bilalteral R > L  De Quervain's tenosynovitis        -  Hx of  L ankle sprain               - creatinine baseline:1.1/1.2  Past Surgical History:    Reviewed history from 07/10/2007 and no changes required:       -Bone Density T:- 1.6 - 01/31/2006  ****REAT BONE DENSITY ON 02/09 ****              - lipid panel 2/08: TC=138, TG=91, HDL=55, LDL=65 -               - sigmoidoscopy (nl) - 03/18/1999   Family History:    Reviewed history from 07/10/2007 and no changes required:       -DM 2       -Oteoporosis (parents and brother)  Social History:    Reviewed history from 01/28/2008 and no changes required:       Unemployed. Disability. Single. No children.  broke up with girlfriend (10/08) and this caused him much anxiety as he was afraid she was out to get him.  Feeling better from this now. (2/09)    Review of Systems      See HPI  Resp      Complains of cough, pleuritic, and sputum productive.      Denies coughing up blood, shortness of breath, and wheezing.   Physical Exam  General:     Well-developed,well-nourished,in no acute distress; alert,appropriate and cooperative throughout examination Lungs:     No crackles or wheezes, decreased breath sounds right lower lobe.    Impression &  Recommendations:  Problem # 1:  COUGH (ICD-786.2) Likely a viral URI but will get a CXR to rule out PNA.  O2 sats good and afebrile today but has been complaining of productive cough and fevers.   Orders: CXR- [facility]view (CXR) [facility]- Est Level  3 ([address])   Complete Medication List: 1)  Klonopin 2 Mg Tabs (Clonazepam) .... Take 2 and a half tablets by mouth twice a day 2)  Toprol Xl 200 Mg Tb24 (Metoprolol succinate) .Marland Kitchen.. 1 by mouth daily 3)  Zocor 40 Mg Tabs (Simvastatin) .... Take 1 tablet by mouth at bedtime 4)  Depo-testosterone 200 Mg/ml Oil (Testosterone cypionate) .... Inject 400 mg im every 2 weeks 5)  Prilosec Otc 20 Mg Tbec (Omeprazole magnesium) .... One daily 6)  Seroquel 300 Mg Tabs (Quetiapine fumarate) .... Take two by mouth daily 7)  Doxepin Hcl 100 Mg Caps (Doxepin hcl) .... Take nine by mouth daily 8)  Oscal 500/200 D-3 500-200 Mg-unit Tabs (Calcium-vitamin d) .... Take one daily 9)  Dilantin 100 Mg Caps (Phenytoin sodium extended) .... Take 4 by mouth daily   Patient Instructions: 1)  Get plenty of rest, drink lots of clear liquids, and use Tylenol or Ibuprofen for fever and comfort. Return in 7-10 days if you're not better:sooner if you're feeling worse. 2)  Try over the counter Mucinex to help get some  of that mucous up.   3)  I will call you if the chest x ray shows pneumonia.   ]

## 2011-01-16 NOTE — Procedures (Signed)
Summary: Colonoscopy - polyp resected- rpt 5 yrs  Hardcopy in MD box.    Colonoscopy  Procedure date:  02/04/2009  Findings:       Results: 5 mm Polyp in distal ascending colon, resected.  Results: Specimen sent for pathology.    Pathology:  Adenomatous polyp.        Location:  [facility].  Return to endoscopist 1 mo.   Comments:      Repeat colonoscopy in 5 years.  miralax 17gm in 8 oz water 1-4 times per day for constipation   Colonoscopy  Procedure date:  02/04/2009  Findings:       Results: 5 mm Polyp in distal ascending colon, resected.  Results: Specimen sent for pathology.    Pathology:  Adenomatous polyp.        Location:  [facility].  Return to endoscopist 1 mo.   Comments:      Repeat colonoscopy in 5 years.  miralax 17gm in 8 oz water 1-4 times per day for constipation   Last Colonoscopy:  Done. (12/17/2005 12:00:00 AM) Colonoscopy Result Date:  02/04/2009 Colonoscopy Result:  normal Colonoscopy Next Due:  5 yr Last Hemoccult Result: Done. (11/16/2005 12:00:00 AM) Hemoccult Next Due:  Not Indicated Last PSA Result:  3.58 (07/27/2008 11:30:33 AM) PSA Next Due:  6 mo

## 2011-01-16 NOTE — Assessment & Plan Note (Signed)
Summary: hematuria/[place]   Primary Care Provider:  Clementeen Graham MD  CC:  hematuria.  History of Present Illness: 55 y/o M here for hematuria  For months he has had frequency, up to Q80minutes.  Sometimes he is only able to void 1 tsp sometimes.  Some dribbling.  He states that several months ago he had cystitis and was Rx antibiotic for this (there is no record of this in [facility], so I don't know where he was treated as he cannot remember. Does not remember name of antibiotic or how long he was taking it.  ).  He noticed dark urine, the color of brown to red, for the last 5 days.  Right now he is not able to void (even after drinking 5 cups of water), but did void this morning at home.  States that he has had elevated PSA in the past and he was told it was due to him taking testosterone.   No dysuria.  no fever, no chills, no abd pain, no nausea, no vomiting, no back pain, no penile discharge.  no tobacco.     Allergies (verified): 1)  ! Sulfa  Past History:  Past Medical History: Last updated: 05/24/2010 - Abd pain ER visit 12/07/08 - obstruction but pt left AMA with concern he would not receive depression/seizure meds correctly.  CT scan showing colon stricture/neoplasm and liver lesion, however thought to just be edema by GI and colonoscopy at f/u (2010) showed adenomatous polyp resected (rpt 5 yrs) - Depression  (see psych 4x/yr, sees therapist 6x/yr) - Schizoaffective diorder/ Schizophrenia  -ADMISSIONS to inpt psych: 1- on 09/05  depression with suicidal thoughts and plans 2-  ~ 1990 for self orchiectomy/suicidal (both admissions at [facility], [place], [place]) - Hx of paradoxical reaction to [employer] - hypogonadism - SELF ORCHIECTOMY  while psychotic (on testosterone IM q 2 wks) - Osteopenia (T -1.5 2007) 2/2 hypogonadism and dilantin - Hyperlipidemia - Hypertension - Seizures disorder (REACHES EASILY TOXIC LEVELS  of dilantin , needs dilantin levels f/u.) Seems that  alternating 150 mg / 200mg  works well 07/08 - Rhinitis, Allergic - 477.9 - Hx of bilalteral R > L  De Quervain's tenosynovitis  - Hx of  L ankle sprain  - creatinine baseline:1.1/1.2  Past Surgical History: Last updated: 09/08/2009 Bone Density T:- 1.6 - 01/31/2006; T -1.6 08/2009.  consider rpt 2012 lipid panel 2/08: TC=138, TG=91, HDL=55, LDL=65 sigmoidoscopy (nl) - 03/18/1999 colonoscopy 2010 - adenomatous polyp resected (rpt 2015)  Review of Systems General:  Denies chills and fever.  Physical Exam  General:  Well-developed,well-nourished,in no acute distress; alert,appropriate and cooperative throughout examination. vitals reviewed.  Abdomen:  Bowel sounds positive,abdomen soft and non-tender without masses, organomegaly or hernias noted. Prostate:  Prostate gland firm and smooth, no enlargement, nodularity, tenderness, mass, asymmetry or induration.   Impression & Recommendations:  Problem # 1:  HEMATURIA UNSPECIFIED (ICD-599.70) Assessment New UA did not show hematuria but showed moderate leuk, tntc wbc, and 3+ bacteria, but no nitrite.  Given complaints, will treat presumptively until Ucx comes back.  Looking back in [place] it seems pt has seen in 09/2009 in ER and was Dx with cystitis.  There is a UCx from 04/2009 that grew klebsiella, sensitive to cipro, bactrim, levoflocacin.  Given that he has had repeat cystitis, will need TOC in 4 wks.    I will also check cbc, psa, bmet.  If UCx in negative than we will need to worry about a malignant process.  Pt states that the last dose of testosterone he took was 10 days ago.  Pt to rtc in 1-2 wks.    His updated medication list for this problem includes:    Cipro 500 Mg Tabs (Ciprofloxacin hcl) .Marland Kitchen... 1 tab by mouth two times a day for 14 days  Orders: Urinalysis-[facility] (00000) Urine Culture-[facility] ([id number]) [facility]- Est Level  3 (99213)Future Orders: CBC-[facility] (09811) ... 07/26/2012 Basic Met-[facility] ([id number]) ... 07/27/2011 PSA-[facility]  [phone]) ... 07/27/2011  Complete Medication List: 1)  Klonopin 2 Mg Tabs (Clonazepam) .... Take 2 and a half tablets by mouth twice a day 2)  Toprol Xl 200 Mg Tb24 (Metoprolol succinate) .Marland Kitchen.. 1 by mouth daily 3)  Zocor 40 Mg Tabs (Simvastatin) .... Take 1 tablet by mouth at bedtime 4)  Depo-testosterone 200 Mg/ml Oil (Testosterone cypionate) .... Inject 400 mg im every 2 weeks 5)  Prilosec Otc 20 Mg Tbec (Omeprazole magnesium) .... One daily 6)  Seroquel 300 Mg Tabs (Quetiapine fumarate) .... Take three by mouth daily 7)  Doxepin Hcl 100 Mg Caps (Doxepin hcl) .... Take nine by mouth daily 8)  Dilantin Infatabs 50 Mg Chew (Phenytoin) .... Take four by mouth once daily 9)  Oscal 500/200 D-3 500-200 Mg-unit Tabs (Calcium-vitamin d) .... Take one daily 10)  Docusate Sodium 100 Mg Caps (Docusate sodium) .... Take one by mouth daily 11)  Miralax Powd (Polyethylene glycol 3350) .Marland KitchenMarland Kitchen. 17 gm in 8 oz fluid qdaily as needed constipation 12)  Monoject Syringe 23g X 1" 3 Ml Misc (Syringe/needle (disp)) .... Use as directed.  qs 1 month 13)  Cipro 500 Mg Tabs (Ciprofloxacin hcl) .Marland Kitchen.. 1 tab by mouth two times a day for 14 days  Patient Instructions: 1)  Please schedule a follow-up appointment in 1-2 weeks to see Dr Janalyn Harder or Dr Denyse Amass. 2)  Cipro 500mg : take this two times a day for 14 days  Prescriptions: CIPRO 500 MG TABS (CIPROFLOXACIN HCL) 1 tab by mouth two times a day for 14 days  #28 x 0   Entered and Authorized by:   Angeline Slim MD   Signed by:   Angeline Slim MD on 07/18/2010   Method used:   Electronically to        [id number] (retail)       [address]       [place], [place]  [address]       Ph: [phone] or [phone]       Fax: [phone]   RxID:   [id number]   Laboratory Results   Urine Tests  Date/Time Received: July 18, 2010 3:47 PM  Date/Time Reported: July 18, 2010 4:32 PM   Routine Urinalysis   Color: yellow Appearance: Clear Glucose: negative   (Normal Range:  Negative) Bilirubin: negative   (Normal Range: Negative) Ketone: negative   (Normal Range: Negative) Spec. Gravity: 1.015   (Normal Range: 1.003-1.035) Blood: negative   (Normal Range: Negative) pH: 6.5   (Normal Range: 5.0-8.0) Protein: negative   (Normal Range: Negative) Urobilinogen: 0.2   (Normal Range: 0-1) Nitrite: negative   (Normal Range: Negative) Leukocyte Esterace: moderate   (Normal Range: Negative)  Urine Microscopic WBC/HPF: TNTC RBC/HPF: 0-2 Bacteria/HPF: 3+ cocci with some chains Epithelial/HPF: 0-2    Comments: urine sent for culture ...............test performed by......Marland KitchenBonnie A. [place], MLS (ASCP)cm

## 2011-01-16 NOTE — Assessment & Plan Note (Signed)
Summary: seizures wp   Vital Signs:  Patient Profile:   55 Years Old Male Height:     70 inches Weight:      172.3 pounds Temp:     99.5 degrees F Pulse rate:   83 / minute BP sitting:   109 / 71  (left arm)  Pt. in pain?   no  Vitals Entered By: Alphia Kava (April 08, 2008 2:31 PM)                  PCP:  Eustaquio Boyden  MD  Chief Complaint:  seizures.  History of Present Illness: Pt with h/o sz disorder.  Thinks he may have had a sz yesterday.  lives alone and it was unwitnessed.  Says he blacked out for a period and when he came to his shoulder was hurting and he had scratches on his right leg.  No bowel or bladder incontinence.    Has been taking dilantin 200mg  and 150mg  alternating every other day.  Has not missed doses.  Is also prescribed klonopin 4mg  two times a day for seizures and anxiety.  Has increased this med to 4mg  three times a day b/c he is scared of having another seizure.    He reports no weaknes, no focal deficits, no fevers.  He comes in with a letter from his psychologist stating that he recommends increasing his klonopin.    Current Allergies: ! SULFA      Physical Exam  General:     NAD Mouth:     dry mouth Lungs:     Normal respiratory effort, chest expands symmetrically. Lungs are clear to auscultation, no crackles or wheezes. Heart:     Normal rate and regular rhythm. S1 and S2 normal without gallop, murmur, click, rub or other extra sounds. Neurologic:     alert & oriented X3, cranial nerves II-XII intact, strength normal in all extremities, and gait normal.   Psych:     strange affect, appears nervous    Impression & Recommendations:  Problem # 1:  SEIZURE DISORDER (ICD-780.39) Assessment: Deteriorated Questionable, unwitnessed sz after being sz-free for 7 years.  Pt requesting increase in klonopin b/c he is scared of having a seizure.  Will check dilantin level and adjust dose accordingly.  Advised I am not his PCP and his  PCP will have to address increasing his klonopin.  I agree that this med will likely help his anxiety, but I'm not familiar with using klonopin as a medication for tonic-clonic seizures if this is what he's having.  I understand it has a role for myoclonic, petit mal, and akinetic seizures.  If his dilantin level is normal, I would consider neuro evaluation since klonopin has no clear indication for tonic-clonic seizures (unless it is off-label use).  The pt is resistant to neuro evaluation b/c he doesn't want them to change around his meds since they have been working so well for 7 years. His updated medication list for this problem includes:    Klonopin 2 Mg Tabs (Clonazepam) .Marland Kitchen... Take 2 tablet by mouth twice a day    Dilantin 100 Mg Caps (Phenytoin sodium extended) .Marland Kitchen... Take 4 by mouth daily   Complete Medication List: 1)  Klonopin 2 Mg Tabs (Clonazepam) .... Take 2 tablet by mouth twice a day 2)  Toprol Xl 200 Mg Tb24 (Metoprolol succinate) .Marland Kitchen.. 1 by mouth daily 3)  Zocor 40 Mg Tabs (Simvastatin) .... Take 1 tablet by mouth at bedtime 4)  Depo-testosterone 200 Mg/ml Oil (Testosterone cypionate) .... Inject 400 mg im every 2 weeks 5)  Prilosec Otc 20 Mg Tbec (Omeprazole magnesium) .... One daily 6)  Seroquel 300 Mg Tabs (Quetiapine fumarate) .... Take two by mouth daily 7)  Doxepin Hcl 100 Mg Caps (Doxepin hcl) .... Take nine by mouth daily 8)  Oscal 500/200 D-3 500-200 Mg-unit Tabs (Calcium-vitamin d) .... Take one daily 9)  Dilantin 100 Mg Caps (Phenytoin sodium extended) .... Take 4 by mouth daily  Other Orders: [facility]- Est Level  3 (99213) Dilantin-[facility] ([id number])     ]

## 2011-01-16 NOTE — Assessment & Plan Note (Signed)
Summary: cough, breathing difficulties  Nurse Visit   Visit Type:  sick visit  Chief Complaint:  vomiting and ?SOB.  History of Present Illness: Sick with weakness and vomiting x 5  One wk ago vomited and started feeling week Gradually felt better Yesterday visited girlfriend and started again with vomiting, seemed worse Has some "brownish" material coming up with dry heaves ?blood Threw up 5x last night, 5-7x dry heaving episodes today Woke up hoarse this morning Has ST No diarrhea Has sick contacts Has cough, girlfriend with bronchitis + fever Has hx seizures, HTN, MDD  Has SOB for "quite some time" Hears popping when takes a deep breath, "sound like rales" No hx of CHF Denies tob/EtOH use   Impression & Recommendations:  Problem # 1:  SOB (ICD-786.05) mild improvement with neb treatment Given script for albuterol inhaler Orders: Pulse Oximetry- [facility] (60454) Diagnostic X-Ray/Fluoroscopy (Diagnostic X-Ray/Flu) Nebulizer- [facility] (09811) Albuterol Sulfate Sol (B1478)   Problem # 2:  PNEUMONIA (ICD-486) Will check CXR Given script for Z-pak Given shot of Rocephin 1g IM rto or go to ER if sxs worsens  f/u in 1 wk  His updated medication list for this problem includes:    Zithromax 250 Mg Tabs (Azithromycin) .Marland Kitchen... Take 2 tabs on day one then one tab daily x 4 days   Problem # 3:  PHARYNGITIS, ACUTE (ICD-462) Rapid strep negative Supportive treatment, likely due to irritation from emesis Lozenges, tylenol/motrin as needed   Orders: Rapid Strep-[facility] (29562)  His updated medication list for this problem includes:    Zithromax 250 Mg Tabs (Azithromycin) .Marland Kitchen... Take 2 tabs on day one then one tab daily x 4 days   Medications Added to Medication List This Visit: 1)  Albuterol 90 Mcg/act Aers (Albuterol) .... 2 puffs q4h as needed sob/wheeze/cough 2)  Zithromax 250 Mg Tabs (Azithromycin) .... Take 2 tabs on day one then one tab daily x 4 days   Physical  Exam  General:     Noisy breathing, no conversational dyspnea, no increased work of breathing Mouth:     dry mucous membranes, OP with erythema, lots of mucous Neck:     No deformities, masses, or tenderness noted. Lungs:     Rhoncherous breath sounds bilaterally, no accessory muscle use.  Pulse ox 91% Heart:     normal rate, regular rhythm, no murmur, and no JVD.   Abdomen:     Bowel sounds positive,abdomen soft and non-tender without masses, organomegaly or hernias noted.   Vital Signs:  Patient Profile:   55 Years Old Male Weight:      188 pounds O2 Sat:      91 % Temp:     101.6 degrees F Pulse rate:   86 / minute BP sitting:   104 / 63  Pt. in pain?   yes  Vitals Entered By: Danella Sensing CMA (February 27, 2007 3:42 PM)              Is Patient Diabetic? No  Prior Medications: Current Allergies: ! SULFA Laboratory Results  Date/Time Received: February 27, 2007 4.42  PM  Date/Time Reported: February 27, 2007 4.53  PM   Other Tests  Rapid Strep: negative     Medication Administration  Injection # 1:    Medication: Rocephin  250mg     Diagnosis: SOB (ICD-786.05)    Route: IM    Site: LUOQ gluteus    Exp Date: 04/16/2008    Lot #: [id number]  Mfr: Baxter    Patient tolerated injection without complications    Given by: Dennison Nancy RN (February 27, 2007 5:24 PM)  Medication # 1:    Medication: Albuterol Sulfate Sol    Diagnosis: SOB (ICD-786.05)    Dose: 2.5mg     Route: inhaled    Exp Date: 10/17/2008    Lot #: [id number]    Mfr: Nephron    Patient tolerated medication without complications    Given by: Dennison Nancy RN (February 27, 2007 5:16 PM)  Orders Added: 1)  Pulse Oximetry- [facility] [94760] 2)  Rapid Strep-[facility] [87430] 3)  Diagnostic X-Ray/Fluoroscopy [Diagnostic X-Ray/Flu] 4)  Nebulizer- [facility] [04540] 5)  Albuterol Sulfate Sol [J8119]     Appended Document: cough, breathing difficulties      Prior Medications: Current Allergies: !  SULFA        Impression & Recommendations:  Problem # 1:  PNEUMONIA (ICD-486)  His updated medication list for this problem includes:    Zithromax 250 Mg Tabs (Azithromycin) .Marland Kitchen... Take 2 tabs on day one then one tab daily x 4 days  Orders: [facility]- Est  Level 4 (14782)   Problem # 2:  PHARYNGITIS, ACUTE (ICD-462)  His updated medication list for this problem includes:    Zithromax 250 Mg Tabs (Azithromycin) .Marland Kitchen... Take 2 tabs on day one then one tab daily x 4 days  Orders: [facility]- Est  Level 4 (95621)   Problem # 3:  SOB (ICD-786.05)  Orders: [facility]- Est  Level 4 (30865)

## 2011-01-16 NOTE — Progress Notes (Signed)
Summary: Requesting to speak with MD  Phone Note Call from Patient Call back at Home Phone [phone]   Summary of Call: Pt is wanting to speak with MD because he states he is very worried that he may have lung cancer. Initial call taken by: Haydee Salter,  May 03, 2008 1:36 PM  Follow-up for Phone Call        c/o productive cough x " a coupla months" worried because he was told he needed a f/u xray & appt for his lungs. read him the report that says pna & f/u recommended. he was ok then Follow-up by: Golden Circle RN,  May 03, 2008 3:11 PM

## 2011-01-16 NOTE — Progress Notes (Signed)
Summary: Rx  Phone Note Refill Request Call back at Home Phone [phone] Message from:  Patient  Refills Requested: Medication #1:  KLONOPIN 2 MG TABS Take 2 and a half tablets by mouth twice a day   Dosage confirmed as above?Dosage Confirmed   Brand Name Necessary? No   Supply Requested: 1 month   Last Refilled: 07/19/2008  Method Requested: Fax to Local Pharmacy Initial call taken by: Haydee Salter,  August 16, 2008 10:38 AM  Follow-up for Phone Call        Rx faxed to pharmacy      Prescriptions: KLONOPIN 2 MG TABS (CLONAZEPAM) Take 2 and a half tablets by mouth twice a day  #152 x 0   Entered and Authorized by:   Eustaquio Boyden  MD   Signed by:   Eustaquio Boyden  MD on 08/18/2008   Method used:   Printed then faxed to ...       [facility]* (retail)       [address]       [place], [place]  [id number]       Ph: [phone]       Fax: [phone]   RxID:   [id number]

## 2011-01-16 NOTE — Progress Notes (Signed)
Summary: Rx Req  Phone Note Refill Request Message from:  Patient  Refills Requested: Medication #1:  KLONOPIN 2 MG TABS Take 2 and a half tablets by mouth twice a day PT [facility] [address] [address]  Initial call taken by: Clydell Hakim,  July 19, 2009 12:20 PM  Follow-up for Phone Call        done. plz let pt know ready. Follow-up by: Eustaquio Boyden  MD,  July 19, 2009 4:17 PM  Additional Follow-up for Phone Call Additional follow up Details #1::        Message left that script is ready to be picked up.  Modesta Messing LPN  July 20, 2009 10:50 AM     Prescriptions: KLONOPIN 2 MG TABS (CLONAZEPAM) Take 2 and a half tablets by mouth twice a day  #152 x 0   Entered and Authorized by:   Eustaquio Boyden  MD   Signed by:   Eustaquio Boyden  MD on 07/19/2009   Method used:   Printed then faxed to ...       [facility]* (retail)       [address]       [place], [place]  [id number]       Ph: [phone]       Fax: [phone]   RxID:   [id number]

## 2011-01-16 NOTE — Progress Notes (Signed)
Summary: Triage  Phone Note Call from Patient Call back at Home Phone [phone]   Summary of Call: Pt would like to speak with someone about seroquil causing diabetes. Initial call taken by: Haydee Salter,  December 12, 2007 12:13 PM  Follow-up for Phone Call        Spoke with pt- he states that he is worried that seroquel is going to cause him to develop diabetes.  States he has a strong family history of the disease, and is concerned because hyperglycemia can be a side effect of seroquel.  Pt states he is not having any symptoms of diabetes- ie extreme thirst, frequent urination, blurred vision, but would just like to keep a check on his blood sugar a few times per week.  Advised that he can talk with Dr. [name] at his next office visit about these concerns, but it would not be necessary for him to check his blood sugar a few times per week, unless he was actually diagnosed with diabetes.  There are no appts with Dr. [name] available until Feb., advised pt to call back in a couple of weeks to schedule an office visit to discuss his concerns, and meet new PCP.  Also - advised him to call back sooner if he develops any unusual symtpoms.  Follow-up by: AMY MARTIN RN,  December 12, 2007 1:47 PM

## 2011-01-16 NOTE — Assessment & Plan Note (Signed)
Summary: PNA f/u   Vital Signs:  Patient Profile:   55 Years Old Male Height:     70 inches Weight:      176 pounds Temp:     98.3 degrees F Pulse rate:   91 / minute BP sitting:   91 / 65  Pt. in pain?   yes    Location:   right shoulder  Vitals Entered By: Jone Baseman CMA (May 21, 2008 2:16 PM)                  PCP:  Eustaquio Boyden  MD  Chief Complaint:  f/u.  History of Present Illness: 55 yo patient of mine presents for f/u of pneumonia and with new arm pain.  1. cough - pt has not had any more fevers since finishing course of antibiotics.  Took Azithromycin for pneumonia diagnosed last month in CXR.  asks about pneumovax to prevent further PNA.  Still with cough, but improving.  2. shoulder/back pain - has been bothering him since his seizure/fall in April.  Pt states he has pain in right shoulder with lifting heavy things, doesn't radiate anywhere.  sharp pain.  worse with exercise.  3. long term use of phenytoin - last bone scan in 2007 with T of -1.6.  needs repeat.  on Calcium supplementation.  osteopenia.      Current Allergies: ! SULFA   Social History:    Unemployed. Disability. Single. No children.  broke up with girlfriend (10/08) and this caused him much anxiety as he was afraid she was out to get him.  Feeling better from this now. (2/09)  denies smoking, EtOH, rec drugs.     Physical Exam  General:     Well-developed,well-nourished,in no acute distress; alert,appropriate and cooperative throughout examination Lungs:     No crackles or wheezes, CTAB Heart:     Normal rate and regular rhythm. S1 and S2 normal without gallop, murmur, click, rub or other extra sounds. Msk:     negative impingement sign, negative empty can sign, full passive range of motion, slight tenderness when scratching back on right Extremities:     No clubbing, cyanosis, edema, or deformity noted with normal full range of motion of all joints.   Neurologic:  sensation and strength intact bilaterally Skin:     Intact without suspicious lesions or rashes    Impression & Recommendations:  Problem # 1:  COUGH (ICD-786.2) Assessment: Improved Improved.  afebrile, decreasing cough.  previous CXR with dx of PNA with atypical infiltrate almost coin like.  want to f/u with rpt CXR.  given likely strep pneumo as causative organism and fact that strep pneumo takes longest to clear on CXR, will repeat CXR in july.  if has not cleared, consider CT scan to further evaluate lesion.  Orders: [facility]- Est  Level 4 (29562)  Future Orders: CXR- 2view (CXR) ... 05/21/2009   Problem # 2:  AFTERCARE, LONG-TERM USE, MEDICATIONS NEC (ICD-V58.69) Assessment: Comment Only dexa scan to monitor osteopenia given on dilantin.  Orders: [facility]- Est  Level 4 (13086)  Future Orders: Dexa scan (Dexa scan) ... 05/20/2009   Problem # 3:  OSTEOPENIA (ICD-733.90) Assessment: Comment Only taking calcium.  rpt dexa scan.  His updated medication list for this problem includes:    Oscal 500/200 D-3 500-200 Mg-unit Tabs (Calcium-vitamin d) .Marland Kitchen... Take one daily  Orders: [facility]- Est  Level 4 (57846)   Problem # 4:  SHOULDER PAIN, RIGHT (ICD-719.41) Assessment: New  rotator cuff tendinopathy vs arthralgia vs cervical radiculopathy.  Pt amenable to monitor for now without trial of nsaids.  reevaluate at next visit.  no red flags.  Complete Medication List: 1)  Klonopin 2 Mg Tabs (Clonazepam) .... Take 2 and a half tablets by mouth twice a day 2)  Toprol Xl 200 Mg Tb24 (Metoprolol succinate) .Marland Kitchen.. 1 by mouth daily 3)  Zocor 40 Mg Tabs (Simvastatin) .... Take 1 tablet by mouth at bedtime 4)  Depo-testosterone 200 Mg/ml Oil (Testosterone cypionate) .... Inject 400 mg im every 2 weeks 5)  Prilosec Otc 20 Mg Tbec (Omeprazole magnesium) .... One daily 6)  Seroquel 300 Mg Tabs (Quetiapine fumarate) .... Take two by mouth daily 7)  Doxepin Hcl 100 Mg Caps (Doxepin hcl) .... Take nine  by mouth daily 8)  Oscal 500/200 D-3 500-200 Mg-unit Tabs (Calcium-vitamin d) .... Take one daily 9)  Dilantin 100 Mg Caps (Phenytoin sodium extended) .... Take 4 by mouth daily 10)  Zithromax Z-pak 250 Mg Tabs (Azithromycin) .... 2 tabs on day one followed by 1 tab days 2-5.   Patient Instructions: 1)  Please return in mid July for follow up of your cough and dexa scan. 2)  Please go a few days prior to your appointment with me to get a repeat chest xray.  Call clinic for the order after July 1st. 3)  We will have you repeat your dexa scan in the next few weeks.  Try to eat foods high in calcium like yogurt, milk and cheese, or take calcium supplementation. 4)  It was a pleasure to see you today.   ]

## 2011-01-16 NOTE — Progress Notes (Signed)
Summary: klonopin refill  ---- Converted from flag ---- ---- 05/17/2008 5:33 PM, Eustaquio Boyden  MD wrote: have done klonopin 2mg , take 2 1/2 twice daily, disp: 75, RF: 1 to last one month.  Pt is out because he self tapered up, but then decreased once i talked to him.  i'm ok with sending refill, pt has appt with me on friday. javier  ---- 05/17/2008 9:23 AM, Golden Circle RN wrote: we rec'd a request for refill on clonazepam. last got 120 on 04/16/08. to take two times a day should not be out for another month. plz advise ------------------------------

## 2011-01-16 NOTE — Progress Notes (Signed)
Summary: Referrals  Phone Note Call from Patient Call back at Home Phone [phone]   Reason for Call: Referral Summary of Call: would like to discuss referral to GI doctor and a nutritionist. Initial call taken by: Haydee Salter,  January 10, 2009 2:30 PM  Follow-up for Phone Call        patient states he needs to see a GI doctor but does not want to see Dr. Loreta Ave. per MD note  on 12/30/2008 he states return to GI doctor in February. will send message to MD to please advise about a new referral. Will schedule appointment for nutrition with Dr. [name]. Dr. [name] notified and she will contact patient. Follow-up by: Theresia Lo RN,  January 10, 2009 4:03 PM  Additional Follow-up for Phone Call Additional follow up Details #1::        waiting till february to refer to GI as this month Dr. Loreta Ave is the on call GI doc.  in february we will refer patient. Additional Follow-up by: Eustaquio Boyden  MD,  January 10, 2009 4:34 PM    Additional Follow-up for Phone Call Additional follow up Details #2::    pt with medicaid - so can refer for gi earlier.  Pat to check on referral to [facility].  I filled out referral order.  please send last office visit with referral.  thanks. Follow-up by: Eustaquio Boyden  MD,  January 10, 2009 6:02 PM  appointment scheduled with Dr. Randa Evens of [facility] for 01/14/2009 at 9:45 AM. patient notified. records faxed. Davita Sublett RN  January 11, 2009 3:01 PM   Appended Document: Referrals pt wants to discuss these appts, as he thinks he will need to r/s one. can be reached at [phone].  Appended Document: Referrals called patient and he wanted to know the name of the Neurologist he has seen in the past. gave him Dr. Clarisa Kindred  phone number.

## 2011-01-16 NOTE — Miscellaneous (Signed)
Summary: Patient summary  Clinical Lists Changes  [name] is a pleasant but quirky 55 yo patient established of practice with h/o hypogonadism from self-orchiectomy leading to testosterone supplementation and osteopenia, history of major depression, hyperlipidemia, HTN, and history of seizures.  Pt also has component of hypochodriasis and chart mentions schizophrenia/schizoaffective disorder and suicidal attempts but patient reports depression as only psych issue.  Pt sees psychiatrist Dr. Drue Second at [facility] who prescribes combination of seroquel and doxepin for depression.  On dilantin and klonopin for seizures.    Problems: Removed problem of CONTACT WITH OR EXPOSURE TO VENEREAL DISEASES (ICD-V01.6) Removed problem of COUGH (ICD-786.2) Assessed HYPERTENSION as comment only - stable on toprol xl.  likely somewhat low, consider decreasing next visit to 100mg  daily if patient agrees.  (pt very anxious about medciation changes)  His updated medication list for this problem includes:    Toprol Xl 200 Mg Tb24 (Metoprolol succinate) .Marland Kitchen... 1 by mouth daily  Prior BP: 108/74 (02/02/2010)  Labs Reviewed: K+: 4.8 (02/02/2010) Creat: : 1.09 (02/02/2010)   Chol: 127 (08/24/2009)   HDL: 42 (08/24/2009)   LDL: 52 (08/24/2009)   TG: 163 (08/24/2009)  Assessed HYPERLIPIDEMIA as comment only - FLP at goal on zocor. His updated medication list for this problem includes:    Zocor 40 Mg Tabs (Simvastatin) .Marland Kitchen... Take 1 tablet by mouth at bedtime  Labs Reviewed: SGOT: 27 (02/02/2010)   SGPT: 40 (02/02/2010)   HDL:42 (08/24/2009), 56 (01/28/2008)  LDL:52 (08/24/2009), 84 (01/28/2008)  Chol:127 (08/24/2009), 160 (01/28/2008)  Trig:163 (08/24/2009), 102 (01/28/2008)  Assessed DEPRESSION, CHRONIC, SEVERE as comment only - continue meds.  defer to psych.  not optimally controlled but pt stable on this regimen.  (seroquel and doxepin).  paradoxical rxn to SSRIs Assessed SEIZURE DISORDER, HX OF as comment only -  continue meds - dilantin and klonopin.  pt very comfortable on this dose of dilantin - even though levels have been elevated.  states he needs higher dose of dilantin for effect.  Also somewhat anxious about sz as did have period in 2010 where he had a few breakthrough seizures so raised klonopin dose to 5mg  two times a day and since then has been seizure free. Advised this is as high as I would feel comfortable going.  Sent back to neurologist Dr. Anne Hahn (see note 11/30/2008) who recommended continued dilantin and didnt' comment on klonopin.  asked to f/u in 6 mo but didn't go back.    Unsure where he started receiving klonopin from. Assessed HYPOGONADISM, MALE S/P SELF-ORCHIECTOMY as comment only - on testosterone.  pt states gives himself shots about every 2 1/2 wks Assessed OSTEOPENIA as comment only - receiving cal/vit D.  due for rpt dexa 05/2010.  His updated medication list for this problem includes:    Oscal 500/200 D-3 500-200 Mg-unit Tabs (Calcium-vitamin d) .Marland Kitchen... Take one daily  Bone Density:  Lumbar Spine:  T Score-2.5 to -1.0 Spine.   Hip Total: T Score -1.0  [facility]. Rpt 2011. (05/28/2008)  Assessed PREVENTIVE HEALTH CARE as comment only - PSA 0.5 (08/2009.) colonoscopy 2010 - adenomatous polyp resected (rpt rec 2015) Assessed OTHER SPECIFIED DISORDERS OF LIVER as comment only - Abd pain ER visit 12/07/08 - obstruction but pt left AMA with concern he would not receive depression/seizure meds correctly if hospitalized.  CT scan showing colon stricture/neoplasm and liver lesion, however thoguht to be edema and colonoscopy at f/u with GI (2010) showed adenomatous polyp resected (rpt recommended in  5 yrs) Observations: Added new observation of PSH REVIEWED: reviewed - no changes required (05/24/2010 15:45) Added new observation of PAST MED HX: - Abd pain ER visit 12/07/08 - obstruction but pt left AMA with concern he would not receive depression/seizure  meds correctly.  CT scan showing colon stricture/neoplasm and liver lesion, however thought to just be edema by GI and colonoscopy at f/u (2010) showed adenomatous polyp resected (rpt 5 yrs) - Depression  (see psych 4x/yr, sees therapist 6x/yr) - Schizoaffective diorder/ Schizophrenia  -ADMISSIONS to inpt psych: 1- on 09/05  depression with suicidal thoughts and plans 2-  ~ 1990 for self orchiectomy/suicidal (both admissions at [facility], [place], [place]) - Hx of paradoxical reaction to [employer] - hypogonadism - SELF ORCHIECTOMY  while psychotic (on testosterone IM q 2 wks) - Osteopenia (T -1.5 2007) 2/2 hypogonadism and dilantin - Hyperlipidemia - Hypertension - Seizures disorder (REACHES EASILY TOXIC LEVELS  of dilantin , needs dilantin levels f/u.) Seems that alternating 150 mg / 200mg  works well 07/08 - Rhinitis, Allergic - 477.9 - Hx of bilalteral R > L  De Quervain's tenosynovitis  - Hx of  L ankle sprain  - creatinine baseline:1.1/1.2 (05/24/2010 15:45)       Impression & Recommendations:  Problem # 1:  HYPERTENSION (ICD-401.9) stable on toprol xl.  likely somewhat low, consider decreasing next visit to 100mg  daily if patient agrees.  (pt very anxious about medciation changes)  His updated medication list for this problem includes:    Toprol Xl 200 Mg Tb24 (Metoprolol succinate) .Marland Kitchen... 1 by mouth daily  Prior BP: 108/74 (02/02/2010)  Labs Reviewed: K+: 4.8 (02/02/2010) Creat: : 1.09 (02/02/2010)   Chol: 127 (08/24/2009)   HDL: 42 (08/24/2009)   LDL: 52 (08/24/2009)   TG: 163 (08/24/2009)  Problem # 2:  HYPERLIPIDEMIA (ICD-272.4) FLP at goal on zocor. His updated medication list for this problem includes:    Zocor 40 Mg Tabs (Simvastatin) .Marland Kitchen... Take 1 tablet by mouth at bedtime  Labs Reviewed: SGOT: 27 (02/02/2010)   SGPT: 40 (02/02/2010)   HDL:42 (08/24/2009), 56 (01/28/2008)  LDL:52 (08/24/2009), 84 (01/28/2008)  Chol:127 (08/24/2009), 160 (01/28/2008)  Trig:163  (08/24/2009), 102 (01/28/2008)  Problem # 3:  DEPRESSION, CHRONIC, SEVERE (ICD-296.33) continue meds.  defer to psych.  not optimally controlled but pt stable on this regimen.  (seroquel and doxepin).  paradoxical rxn to SSRIs  Problem # 4:  SEIZURE DISORDER, HX OF (ICD-V12.49) continue meds - dilantin and klonopin.  pt very comfortable on this dose of dilantin - even though levels have been elevated.  states he needs higher dose of dilantin for effect.  Also somewhat anxious about sz as did have period in 2010 where he had a few breakthrough seizures so raised klonopin dose to 5mg  two times a day and since then has been seizure free. Advised this is as high as I would feel comfortable going.  Sent back to neurologist Dr. Anne Hahn (see note 11/30/2008) who recommended continued dilantin and didnt' comment on klonopin.  asked to f/u in 6 mo but didn't go back.    Unsure where he started receiving klonopin from.  Problem # 5:  HYPOGONADISM, MALE S/P SELF-ORCHIECTOMY (ICD-257.2) on testosterone.  pt states gives himself shots about every 2 1/2 wks  Problem # 6:  OSTEOPENIA (ICD-733.90) receiving cal/vit D.  due for rpt dexa 05/2010.  His updated medication list for this problem includes:    Oscal 500/200 D-3 500-200 Mg-unit Tabs (Calcium-vitamin d) .Marland Kitchen... Take one  daily  Bone Density:  Lumbar Spine:  T Score-2.5 to -1.0 Spine.   Hip Total: T Score -1.0  Location: [facility]. Rpt 2011. (05/28/2008)  Problem # 7:  PREVENTIVE HEALTH CARE (ICD-V70.0) PSA 0.5 (08/2009.) colonoscopy 2010 - adenomatous polyp resected (rpt rec 2015)  Problem # 8:  OTHER SPECIFIED DISORDERS OF LIVER (ICD-573.8) Abd pain ER visit 12/07/08 - obstruction but pt left AMA with concern he would not receive depression/seizure meds correctly if hospitalized.  CT scan showing colon stricture/neoplasm and liver lesion, however thoguht to be edema and colonoscopy at f/u with GI (2010) showed adenomatous polyp  resected (rpt recommended in 5 yrs)  Complete Medication List: 1)  Klonopin 2 Mg Tabs (Clonazepam) .... Take 2 and a half tablets by mouth twice a day 2)  Toprol Xl 200 Mg Tb24 (Metoprolol succinate) .Marland Kitchen.. 1 by mouth daily 3)  Zocor 40 Mg Tabs (Simvastatin) .... Take 1 tablet by mouth at bedtime 4)  Depo-testosterone 200 Mg/ml Oil (Testosterone cypionate) .... Inject 400 mg im every 2 weeks 5)  Prilosec Otc 20 Mg Tbec (Omeprazole magnesium) .... One daily 6)  Seroquel 300 Mg Tabs (Quetiapine fumarate) .... Take three by mouth daily 7)  Doxepin Hcl 100 Mg Caps (Doxepin hcl) .... Take nine by mouth daily 8)  Dilantin Infatabs 50 Mg Chew (Phenytoin) .... Take four by mouth once daily 9)  Oscal 500/200 D-3 500-200 Mg-unit Tabs (Calcium-vitamin d) .... Take one daily 10)  Docusate Sodium 100 Mg Caps (Docusate sodium) .... Take one by mouth daily 11)  Miralax Powd (Polyethylene glycol 3350) .Marland KitchenMarland Kitchen. 17 gm in 8 oz fluid qdaily as needed constipation 12)  Monoject Syringe 23g X 1" 3 Ml Misc (Syringe/needle (disp)) .... Use as directed.  qs 1 month  Past History:  Past Medical History: - Abd pain ER visit 12/07/08 - obstruction but pt left AMA with concern he would not receive depression/seizure meds correctly.  CT scan showing colon stricture/neoplasm and liver lesion, however thought to just be edema by GI and colonoscopy at f/u (2010) showed adenomatous polyp resected (rpt 5 yrs) - Depression  (see psych 4x/yr, sees therapist 6x/yr) - Schizoaffective diorder/ Schizophrenia  -ADMISSIONS to inpt psych: 1- on 09/05  depression with suicidal thoughts and plans 2-  ~ 1990 for self orchiectomy/suicidal (both admissions at [facility], [place], [place]) - Hx of paradoxical reaction to [employer] - hypogonadism - SELF ORCHIECTOMY  while psychotic (on testosterone IM q 2 wks) - Osteopenia (T -1.5 2007) 2/2 hypogonadism and dilantin - Hyperlipidemia - Hypertension - Seizures disorder (REACHES EASILY TOXIC  LEVELS  of dilantin , needs dilantin levels f/u.) Seems that alternating 150 mg / 200mg  works well 07/08 - Rhinitis, Allergic - 477.9 - Hx of bilalteral R > L  De Quervain's tenosynovitis  - Hx of  L ankle sprain  - creatinine baseline:1.1/1.2  Past Surgical History: Reviewed history from 09/08/2009 and no changes required. Bone Density T:- 1.6 - 01/31/2006; T -1.6 08/2009.  consider rpt 2012 lipid panel 2/08: TC=138, TG=91, HDL=55, LDL=65 sigmoidoscopy (nl) - 03/18/1999 colonoscopy 2010 - adenomatous polyp resected (rpt 2015)

## 2011-01-16 NOTE — Progress Notes (Signed)
Summary: refill  Phone Note Refill Request Call back at Home Phone [phone] Message from:  Patient  Refills Requested: Medication #1:  DEPO-TESTOSTERONE 200 MG/ML  OIL inject 400 mg IM every 2 weeks  Medication #2:  KLONOPIN 2 MG TABS Take 2 and a half tablets by mouth twice a day  Medication #3:  TOPROL XL 200 MG  TB24 1 by mouth daily pt uses this pharmacy now and meds needs to be call in there. -[facility] [address]  Initial call taken by: De Nurse,  October 19, 2009 12:27 PM  Follow-up for Phone Call        to pcp Follow-up by: Golden Circle RN,  October 19, 2009 12:28 PM  Additional Follow-up for Phone Call Additional follow up Details #1::        done Additional Follow-up by: Eustaquio Boyden  MD,  October 19, 2009 3:11 PM    Additional Follow-up for Phone Call Additional follow up Details #2::    faxed to [facility] [address] Follow-up by: Golden Circle RN,  October 19, 2009 3:33 PM  Prescriptions: DEPO-TESTOSTERONE 200 MG/ML  OIL (TESTOSTERONE CYPIONATE) inject 400 mg IM every 2 weeks  #4 x 3   Entered and Authorized by:   Eustaquio Boyden  MD   Signed by:   Eustaquio Boyden  MD on 10/19/2009   Method used:   Printed then faxed to ...       [facility]* (retail)       [address]       [place], [place]  [address]       Ph: [phone]       Fax: [phone]   RxID:   [id number] TOPROL XL 200 MG  TB24 (METOPROLOL SUCCINATE) 1 by mouth daily  #30 x 5   Entered and Authorized by:   Eustaquio Boyden  MD   Signed by:   Eustaquio Boyden  MD on 10/19/2009   Method used:   Printed then faxed to ...       [facility]* (retail)       [name]       [place], [place]  [address]       Ph: [phone]       Fax: [phone]   RxID:   [id number] KLONOPIN 2 MG TABS (CLONAZEPAM) Take 2 and a half tablets by mouth twice a day  #152 x 0   Entered and Authorized by:   Eustaquio Boyden  MD   Signed by:   Eustaquio Boyden   MD on 10/19/2009   Method used:   Printed then faxed to ...       [facility]* (retail)       [address]       [place], [place]  [address]       Ph: [phone]       Fax: [phone]   RxID:   [id number]

## 2011-01-16 NOTE — Miscellaneous (Signed)
Summary: Urology Labs PSA 3.58, Testost 429  Clinical Lists Changes  Observations: Added new observation of TESTO, TOTAL: 429 ng/dL (21/30/8657 84:69) Added new observation of PSA: 3.58 ng/mL (07/27/2008 11:30)       Appended Document: Urology records   1992 - first urology visit for trauatic removal of testicles.     Sees Dr. Gaynelle Arabian of [facility].  Tried levitra and PGE2 injections for impotence.  last OV on 07/27/08:   PSA 3.58, Test 429.    Plan:   1. to see Dr. Earlene Plater yearly   2. PGE 71mcg/cc injections for ED  [name] Boyden  MD  September 06, 2008 9:03 AM   Clinical Lists Changes

## 2011-01-16 NOTE — Assessment & Plan Note (Signed)
Summary: f/u colon concerns   Vital Signs:  Patient Profile:   55 Years Old Male Height:     70 inches Weight:      191.9 pounds BMI:     27.63 Temp:     98.6 degrees F oral Pulse rate:   87 / minute BP sitting:   104 / 68  (left arm) Cuff size:   regular  Pt. in pain?   no  Vitals Entered By: Garen Grams LPN (December 30, 2008 2:03 PM)                 Last Flex Sig:  Done. (03/18/1999 12:00:00 AM) Flex Sig Next Due:  Not Indicated Last PSA Result:  3.58 (07/27/2008 11:30:33 AM) PSA Next Due:  1 yr   PCP:  Eustaquio Boyden  MD  Chief Complaint:  f/u colon concerns.  History of Present Illness: 1. abd pain - recently seen in ED for abd cramps, CT showing concern for dilated colon distal to possible stricture r/o neoplasm.  ER wanted to admit pt for obstruction but left AMA as he was scared they would not give him his psych/seizure meds.  Pt also with h/o constipation states uses OTC laxative once a month.  Colonoscopy and endoscopy 2005 (admission for colitis) with biopsies that returned normal.  Rec was to f/u colonoscopy in 10 yrs.  Pt somehow received another colonoscopy 12/2005 (no records available to me currently) which was normal.  Pt seen 2005 by Dr. Loreta Ave, would like another GI doc as felt that with her recommendation to f/u in 10 yrs she was too lax.    2. CT findings 12/07/08 - colonic stricture r/o neoplasm at area where there was colitis in 2005.  Also hypervascular lesion in liver, stable since CT 2005.  3. Arm pain - right shoulder pain, worse with adduction/internal rotation of shoulder, sore along triceps muscle superior arm.  Pt feels improving, but healing too slowly.  Sustained injury after fall late 09.  Has not tried NSAID or tylenol trial.  4. Neurologist visit - Dr. Anne Hahn.  continue Dilantin 200mg  at bedtime.  Phenytoin level was 13.9.  5. psych - Scheduled to see Dr. Ilsa Iha tomorrow at [facility].  6. Urology - to see urologist next month for f/u of  elevated PSA (3.58) and testosterone.    Current Allergies: ! SULFA  Past Medical History:    - Abd pain ER visit 12/07/08 - obstruction but pt left AMA with concern he would not receive depression/seizure meds correctly.  CT scan showing colon stricture/neoplasm and liver lesion, will f/u 2/10 with GI.    - Depression  (see psych 4x/yr, sees therapist 6x/yr)    - Schizoaffective diorder/ Schizophrenia     -ADMISSIONS 2 TO PSYCHE issues x 2 :                                                                     1- on 09/05  depression with suicidal thoughts and plans  2-  ~ 1990 for self orchiectomy/suicidal                                                                     both admissions at [facility], [place], [place]    - Hx of paradoxical reactions to [employer]    - hypogonadism - SELF ORCHIECTOMY  while psychotic yr ( on testosterone IM q 2 wks)    -Osteopenia  2 to hypogonadism and dilantin (REPEAT BONE DENSITY ON  06/2010)    -Hyperlipidemia    -Hypertension    -Seizures disorder ( REACHS EASILY  TOXIC LEVELS  of dilantin , needs dilantin levels f/u . Seems that alternating 150 mg / 200mg  works well 07/08)    - Rhinitis, Allergic - 477.9    - Hx of bilalteral R > L  De Quervain's tenosynovitis     -  Hx of  L ankle sprain     - creatinine baseline:1.1/1.2   Family History:    -DM 2    -Oteoporosis (parents and brother)    - Father had CVA 11/2008     Physical Exam  General:     Well-developed,well-nourished,in no acute distress; alert,appropriate and cooperative throughout examination Lungs:     No crackles or wheezes, CTAB Heart:     Normal rate and regular rhythm. S1 and S2 normal without gallop, murmur, click, rub or other extra sounds. Abdomen:     Bowel sounds positive,abdomen soft and non-tender without masses, organomegaly or hernias noted. Msk:     no tenderness along rotator cuff, mostly  lateral upper arm    Impression & Recommendations:  Problem # 1:  SHOULDER PAIN, RIGHT (ICD-719.41) Assessment: Improved improving although slowly.  Advised to use trial of tylenol and continue exercising arm. Orders: [facility]- Est  Level 4 ([address])   Problem # 2:  UNSPECIFIED INTESTINAL OBSTRUCTION (ICD-560.9) Assessment: Improved stable now.  good BMs.  Would like stool softener/ laxative for constipation.  To see GI february for further eval. Orders: [facility]- Est  Level 4 (99214)   Problem # 3:  OTHER SPECIFIED DISORDERS OF LIVER (ICD-573.8) Assessment: Unchanged Stable from CT 2005.  To see GI for further recs. Orders: Comp Met-[facility] (60454-09811) [facility]- Est  Level 4 (91478)   Problem # 4:  OVERWEIGHT (ICD-278.02) Assessment: Deteriorated referral to [facility].  Pt has been gaining weight over last few months.  States not motivated to cook healthy given depression. Orders: Nutrition Referral (Nutrition) [facility]- Est  Level 4 (29562)   Problem # 5:  SEIZURE DISORDER, HX OF (ICD-V12.49) Assessment: Unchanged stable per neuro.  Dilantin level 13.9 Orders: [facility]- Est  Level 4 (99214)   Problem # 6:  CONSTIPATION (ICD-564.00) Assessment: New sent script for docusate daily, miralax as needed.  His updated medication list for this problem includes:    Docusate Sodium 100 Mg Caps (Docusate sodium) .Marland Kitchen... Take one by mouth daily    Miralax Powd (Polyethylene glycol 3350) .Marland KitchenMarland KitchenMarland KitchenMarland Kitchen 17 gm in 8 oz fluid qdaily as needed constipation   Problem # 7:  HYPERTENSION NEC (ICD-997.91) Assessment: Comment Only decrease toprol at next visit as bp has been good over last several visits.  Complete Medication List: 1)  Klonopin 2 Mg Tabs (Clonazepam) .Marland KitchenMarland KitchenMarland Kitchen  Take 2 and a half tablets by mouth twice a day 2)  Toprol Xl 200 Mg Tb24 (Metoprolol succinate) .Marland Kitchen.. 1 by mouth daily 3)  Zocor 40 Mg Tabs (Simvastatin) .... Take 1 tablet by mouth at bedtime 4)  Depo-testosterone 200 Mg/ml Oil (Testosterone  cypionate) .... Inject 400 mg im every 2 weeks 5)  Prilosec Otc 20 Mg Tbec (Omeprazole magnesium) .... One daily 6)  Seroquel 300 Mg Tabs (Quetiapine fumarate) .... Take two by mouth daily 7)  Doxepin Hcl 100 Mg Caps (Doxepin hcl) .... Take nine by mouth daily 8)  Oscal 500/200 D-3 500-200 Mg-unit Tabs (Calcium-vitamin d) .... Take one daily 9)  Dilantin Infatabs 50 Mg Chew (Phenytoin) .... Take four by mouth once daily 10)  Docusate Sodium 100 Mg Caps (Docusate sodium) .... Take one by mouth daily 11)  Miralax Powd (Polyethylene glycol 3350) .Marland KitchenMarland Kitchen. 17 gm in 8 oz fluid qdaily as needed constipation   Patient Instructions: 1)  Please return in 1-2 months for follow up. 2)  Good to see you today.  we have drawn blood work to check your liver.  If you have not received these results within 2 weeks, please call us. 3)  Work on increasing exercise and watching what you eat - more fruits and vegetables - to better control your weight.  We will set you up with our nutritionist, Wyona Almas. 4)  I have sent 2 medicines to your pharmacy - use these instead of over the counter laxatives. 5)  You can try Tylenol for your shoulder pain for a trial of a few weeks.   Prescriptions: MIRALAX  POWD (POLYETHYLENE GLYCOL 3350) 17 gm in 8 oz fluid qdaily as needed constipation  #1 x 1   Entered and Authorized by:   Eustaquio Boyden  MD   Signed by:   Eustaquio Boyden  MD on 12/30/2008   Method used:   Electronically to        [facility]* (retail)       [address]       [place], [place]  [id number]       Ph: [phone]       Fax: [phone]   RxID:   [id number] DOCUSATE SODIUM 100 MG CAPS (DOCUSATE SODIUM) take one by mouth daily  #30 x 1   Entered and Authorized by:   Eustaquio Boyden  MD   Signed by:   Eustaquio Boyden  MD on 12/30/2008   Method used:   Electronically to        [facility]* (retail)       [address]       [place], [place]  [id number]        Ph: [phone]       Fax: [phone]   RxID:   [id number]   Appended Document: f/u colon concerns    Clinical Lists Changes  Observations: Added new observation of DILANTIN: 13.9 mcg/mL (11/30/2008 18:00)

## 2011-01-16 NOTE — Progress Notes (Signed)
Summary: CALL  Phone Note Call from Patient   Caller: [name] Summary of Call: PT NEEDS A RETURN CALL FROM NURSE.  PT DX WITH PNEUMONIA AND NEEDS TO KNOW WHAT OTC MED HE CAN TAKE FOR CONGESTION Initial call taken by: Dedra Skeens CMA,,  Apr 29, 2008 12:24 PM  Follow-up for Phone Call        told him to get mucinex per ov note of yesterday and drink plenty of water. he agreed Follow-up by: Golden Circle RN,  Apr 29, 2008 12:34 PM

## 2011-01-16 NOTE — Miscellaneous (Signed)
Summary: toprol xl change please/ts  Clinical Lists Changes pharmacist Meriam Sprague [phone] called in re: pt takes Toprol XL 200mg  daily. Dr.[place] Festa can you change it to Metoprolol please? fwd. to Dr.G. ...................................................................THEKLA SLADE CMA,  March 17, 2008 11:35 AM  Called pharmacy - issue was that there is limited supply of generic toprol XL, and medicaid requires generic.  But apparently there was enough medication, so no change is needed. ...................................................................Eustaquio Boyden  MD  March 20, 2008 2:05 PM   Medications: Changed medication from TOPROL XL 200 MG  TB24 (METOPROLOL SUCCINATE) take one daily to METOPROLOL TARTRATE 100 MG  TABS (METOPROLOL TARTRATE) take one by mouth two times a day - Signed Changed medication from METOPROLOL TARTRATE 100 MG  TABS (METOPROLOL TARTRATE) take one by mouth two times a day to TOPROL XL 200 MG  TB24 (METOPROLOL SUCCINATE) 1 by mouth daily - Signed Rx of METOPROLOL TARTRATE 100 MG  TABS (METOPROLOL TARTRATE) take one by mouth two times a day;  #60 x 12;  Signed;  Entered by: Eustaquio Boyden  MD;  Authorized by: Eustaquio Boyden  MD;  Method used: Electronic Rx of TOPROL XL 200 MG  TB24 (METOPROLOL SUCCINATE) 1 by mouth daily;  #30 x 12;  Signed;  Entered by: Eustaquio Boyden  MD;  Authorized by: Eustaquio Boyden  MD;  Method used: Electronic    Prescriptions: TOPROL XL 200 MG  TB24 (METOPROLOL SUCCINATE) 1 by mouth daily  #30 x 12   Entered and Authorized by:   Eustaquio Boyden  MD   Signed by:   Eustaquio Boyden  MD on 03/20/2008   Method used:   Electronically sent to ...       [facility]*       [address]       [place], [place]  [address]       Ph: [phone] or [phone]       Fax: [phone]   RxID:   [id number] METOPROLOL TARTRATE 100 MG  TABS (METOPROLOL TARTRATE) take one by mouth two times a day   #60 x 12   Entered and Authorized by:   Eustaquio Boyden  MD   Signed by:   Eustaquio Boyden  MD on 03/20/2008   Method used:   Electronically sent to ...       [facility]*       [address]       [place], [place]  [address]       Ph: [phone] or [phone]       Fax: [phone]   RxID:   [id number]

## 2011-01-16 NOTE — Miscellaneous (Signed)
Clinical Lists Changes  Observations: Added new observation of SOCIAL HX: Unemployed. Disability. Single. No children (07/10/2007 10:53) Added new observation of FAMILY HX: -DM 2 -Oteoporosis (parents and brother) (07/10/2007 10:53) Added new observation of PAST SURG HX: -Bone Density T:- 1.6 - 01/31/2006  ****REAT BONE DENSITY ON 02/09 ****  - lipid panel 2/08: TC=138, TG=91, HDL=55, LDL=65 -   - sigmoidoscopy (nl) - 03/18/1999 (07/10/2007 10:53) Added new observation of PAST MED HX: -Depression  (see psych 4x/yr, sees therapist 6x/yr) - Schizoaffective diorder/ Schizophrenia  -ADMISSIONS 2 TO PSYCHE issues x 2 :                                                                  1- on 09/05  depression with suicidal thoughts and plans                                                                  2-  ~ 1990 for self orchiectomy/suicidal                                                                  both admissions at [facility], [place], [place] - Hx of paradoxical reactions to [employer]  - hypogonadism - SELF ORCHIECTOMY  while psychotic yr ( on testosterone IM q 2 wks) -Osteopenia  2 to hypogonadism and dilantin (REPEAT BONE DENSITY ON  01/2008)  -Hyperlipidemia -Hypertension  -Seizures disorder ( REACHS EASILY  TOXIC LEVELS  of dilantin , needs dilantin levels f/u . Seems that alternating 150 mg / 200mg  works well 07/08)  - Rhinitis, Allergic - 477.9  - Hx of bilalteral R > L  De Quervain's tenosynovitis  -  Hx of  L ankle sprain   - creatinine baseline:1.1/1.2 (07/10/2007 10:53)       Past Medical History:    -Depression  (see psych 4x/yr, sees therapist 6x/yr)    - Schizoaffective diorder/ Schizophrenia     -ADMISSIONS 2 TO PSYCHE issues x 2 :                                                                     1- on 09/05  depression with suicidal thoughts and plans  2-  ~ 1990 for self  orchiectomy/suicidal                                                                     both admissions at [facility], [place], [place]    - Hx of paradoxical reactions to [employer]        - hypogonadism - SELF ORCHIECTOMY  while psychotic yr ( on testosterone IM q 2 wks)    -Osteopenia  2 to hypogonadism and dilantin (REPEAT BONE DENSITY ON  01/2008)        -Hyperlipidemia    -Hypertension        -Seizures disorder ( REACHS EASILY  TOXIC LEVELS  of dilantin , needs dilantin levels f/u . Seems that alternating 150 mg / 200mg  works well 07/08)        - Rhinitis, Allergic - 477.9        - Hx of bilalteral R > L  De Quervain's tenosynovitis     -  Hx of  L ankle sprain         - creatinine baseline:1.1/1.2  Past Surgical History:    -Bone Density T:- 1.6 - 01/31/2006  ****REAT BONE DENSITY ON 02/09 ****        - lipid panel 2/08: TC=138, TG=91, HDL=55, LDL=65 -         - sigmoidoscopy (nl) - 03/18/1999   Family History:    -DM 2    -Oteoporosis (parents and brother)  Social History:    Unemployed. Disability. Single. No children

## 2011-01-16 NOTE — Progress Notes (Signed)
Summary: letter request  Phone Note Call from Patient Call back at Home Phone [phone]   Reason for Call: Talk to Doctor Summary of Call: pt sts he needs a note faxed to his psychiatrist stating he was being treated for phneumonia b/c he missed his appt b/c of how sick he was, it needs to be faxed to Dr. Ilsa Iha @ [phone] Initial call taken by: Denny Peon LEVAN,  March 04, 2007 3:11 PM  Follow-up for Phone Call        called pt/will fax info to psychiatrist ...................................................................Mikala Podoll CMA,  March 04, 2007 4:01 PM

## 2011-01-16 NOTE — Assessment & Plan Note (Signed)
Summary: meeting new md wp   Vital Signs:  Patient Profile:   55 Years Old Male Height:     70 inches Weight:      185.5 pounds BMI:     26.71 Temp:     97.6 degrees F Pulse rate:   100 / minute BP sitting:   106 / 69  (right arm)  Pt. in pain?   no  Vitals Entered By: Starleen Blue RN (January 28, 2008 2:42 PM)                  PCP:  Eustaquio Boyden  MD  Chief Complaint:  meet MD.  History of Present Illness: 55 yo patient established of practice who presents to me for first time, with h/o HTN, osteopenia, hypogonadism from self-orchiectomy, history of major depression, hyperlipidemia, and history of seizures.  Of note, chart has noted hypochodriasis and other psychiatric history including schizophrenia/schizoaffective disorder and SA but patient reports depression as only psych issue.  Pt sees psychiatrist Dr. Drue Second at [facility] who prescribes seroquel and doxapin.  1. HTN - no HA, vision changes, chest pain or tightness, urinary changes.  Has been taking toprol XL 100 QD for this.  BP today 106/69  2. hypogonadism - on testosterone 400 mg IM every 2 weeks, has been on this since 15 years ago when he performed self-orchiectomy after major depressive episode.  3. seizures - per patient, psych medications caused him to have seizures so he was started on Dilantin.  He is worried today about his dilantin level as he has not checked it in a while, but reports compliance with medications.  He states that he has been seizure free for eight years, reports anxiety about stopping dilantin as this seems to be working for him. concerned about "gingival hyperplasia" side fx  4. Depression - Pt on seroquel, doxapin, and klonopin for depression/anxiety per patient.  States this works well for him, but concerned about seroquel as he's heard this can cause diabetes and he has family h/o DM.  Pt on disability from depression.  5. hyperlipidemia - pt worried about cholesterol levels, hasn't  checked in a long time.  on zocor 40mg  daily.  6. osteopenia - pt on calcium/vitamin D supplementation, may be taking too much.  will address at next visit.  Pt states he had Dexa scan which showed osteoporosis.    Updated Prior Medication List: KLONOPIN 2 MG TABS (CLONAZEPAM) Take 2 tablet by mouth twice a day TOPROL XL 200 MG  TB24 (METOPROLOL SUCCINATE) take one daily ZOCOR 40 MG TABS (SIMVASTATIN) Take 1 tablet by mouth at bedtime DEPO-TESTOSTERONE 200 MG/ML  OIL (TESTOSTERONE CYPIONATE) inject 400 mg IM every 2 weeks PRILOSEC OTC 20 MG  TBEC (OMEPRAZOLE MAGNESIUM) one daily SEROQUEL 300 MG  TABS (QUETIAPINE FUMARATE) take two by mouth daily DOXEPIN HCL 100 MG  CAPS (DOXEPIN HCL) take nine by mouth daily OSCAL 500/200 D-3 500-200 MG-UNIT  TABS (CALCIUM-VITAMIN D) take one daily DILANTIN 100 MG  CAPS (PHENYTOIN SODIUM EXTENDED) take 4 by mouth daily  Current Allergies: ! SULFA  Past Medical History:    Reviewed history from 07/10/2007 and no changes required:       -Depression  (see psych 4x/yr, sees therapist 6x/yr)       - Schizoaffective diorder/ Schizophrenia        -ADMISSIONS 2 TO PSYCHE issues x 2 :  1- on 09/05  depression with suicidal thoughts and plans                                                                        2-  ~ 1990 for self orchiectomy/suicidal                                                                        both admissions at [facility], [place], [place]       - Hx of paradoxical reactions to [employer]              - hypogonadism - SELF ORCHIECTOMY  while psychotic yr ( on testosterone IM q 2 wks)       -Osteopenia  2 to hypogonadism and dilantin (REPEAT BONE DENSITY ON  01/2008)              -Hyperlipidemia       -Hypertension              -Seizures disorder ( REACHS EASILY  TOXIC LEVELS  of dilantin , needs dilantin levels f/u . Seems that alternating 150 mg / 200mg   works well 07/08)              - Rhinitis, Allergic - 477.9              - Hx of bilalteral R > L  De Quervain's tenosynovitis        -  Hx of  L ankle sprain               - creatinine baseline:1.1/1.2  Past Surgical History:    Reviewed history from 07/10/2007 and no changes required:       -Bone Density T:- 1.6 - 01/31/2006  ****REAT BONE DENSITY ON 02/09 ****              - lipid panel 2/08: TC=138, TG=91, HDL=55, LDL=65 -               - sigmoidoscopy (nl) - 03/18/1999   Family History:    Reviewed history from 07/10/2007 and no changes required:       -DM 2       -Oteoporosis (parents and brother)  Social History:    Unemployed. Disability. Single. No children.  broke up with girlfriend (10/08) and this caused him much anxiety as he was afraid she was out to get him.  Feeling better from this now. (2/09)   Risk Factors:  Tobacco use:  never    Physical Exam  General:     Well-developed,well-nourished,in no acute distress; alert,appropriate and cooperative throughout examination Head:     Normocephalic and atraumatic without obvious abnormalities. No apparent alopecia or balding. Mouth:     Oral mucosa and oropharynx without lesions or exudates.   Neck:     No deformities, masses, or tenderness noted. Lungs:     Normal respiratory effort, chest expands symmetrically. Lungs are clear to auscultation, no  crackles or wheezes. Heart:     Normal rate and regular rhythm. S1 and S2 normal without gallop, murmur, click, rub or other extra sounds. Abdomen:     Bowel sounds positive,abdomen soft and non-tender without masses, organomegaly or hernias noted. Msk:     No deformity or scoliosis noted of thoracic or lumbar spine.   Pulses:     + 2 peripheral pulses Extremities:     No clubbing, cyanosis, edema, or deformity noted with normal full range of motion of all joints.   Skin:     Intact without suspicious lesions or rashes Psych:     Cognition and judgment appear  intact. Alert and cooperative with normal attention span and concentration. No apparent delusions, illusions, hallucinations.  somewhat flattened affect    Impression & Recommendations:  Problem # 1:  HYPERTENSION NEC (ICD-997.91) Assessment: Improved continue metoprolol XL.  Good control.  Orders: Basic Met-[facility] ([id number]) [facility]- Est  Level 4 (59563)   Problem # 2:  SEIZURE DISORDER, HX OF (ICD-V12.49) Assessment: Unchanged check dilantin level. Orders: Dilantin-[facility] ([id number]) [facility]- Est  Level 4 (18841)   Problem # 3:  OSTEOPENIA (ICD-733.90) Assessment: Comment Only pt needs repeat dexa scan.  last one 2/07 showed T score -1.6.  will address at next visit. Orders: [facility]- Est  Level 4 (66063)  His updated medication list for this problem includes:    Oscal 500/200 D-3 500-200 Mg-unit Tabs (Calcium-vitamin d) .Marland Kitchen... Take one daily   Problem # 4:  HYPOGONADISM, MALE (ICD-257.2) Assessment: Comment Only refilled testosterone script.  will check testosterone level.  Orders: Testosterone-[facility] ([id number]) [facility]- Est  Level 4 (55732)   Problem # 5:  HYPERLIPIDEMIA (ICD-272.4) Assessment: Unchanged check FLP.  His updated medication list for this problem includes:    Zocor 40 Mg Tabs (Simvastatin) .Marland Kitchen... Take 1 tablet by mouth at bedtime  Orders: Lipid-[facility] ([id number]) [facility]- Est  Level 4 (37628)   Problem # 6:  DEPRESSION, CHRONIC, SEVERE (ICD-296.33) Assessment: Unchanged continue klonopin, seroquel, doxepin.  will try to get records from psychiatrist to ensure we are clear with who is prescribing what medications.  Complete Medication List: 1)  Klonopin 2 Mg Tabs (Clonazepam) .... Take 2 tablet by mouth twice a day 2)  Toprol Xl 200 Mg Tb24 (Metoprolol succinate) .... Take one daily 3)  Zocor 40 Mg Tabs (Simvastatin) .... Take 1 tablet by mouth at bedtime 4)  Depo-testosterone 200 Mg/ml Oil (Testosterone cypionate) .... Inject 400 mg im every 2 weeks 5)   Prilosec Otc 20 Mg Tbec (Omeprazole magnesium) .... One daily 6)  Seroquel 300 Mg Tabs (Quetiapine fumarate) .... Take two by mouth daily 7)  Doxepin Hcl 100 Mg Caps (Doxepin hcl) .... Take nine by mouth daily 8)  Oscal 500/200 D-3 500-200 Mg-unit Tabs (Calcium-vitamin d) .... Take one daily 9)  Dilantin 100 Mg Caps (Phenytoin sodium extended) .... Take 4 by mouth daily   Patient Instructions: 1)  Please return in 4-6 weeks for f/u visit. 2)  We will send you a letter with your test results drawn today. 3)  Please call us with any questions. 4)  Remember to take your calcium and vitamin D, and try to exercise more to continue to lose weight.  Good job on the 9 lb weight loss! 5)  It was nice to meet you today.  Hope you have a great week.    Prescriptions: DILANTIN 100 MG  CAPS (PHENYTOIN SODIUM EXTENDED) take 4 by mouth daily  #120 x 13  Entered and Authorized by:   Eustaquio Boyden  MD   Signed by:   Eustaquio Boyden  MD on 02/01/2008   Method used:   Electronically sent to ...       [facility]*       [address]       [place], [place]  [address]       Ph: [phone] or [phone]       Fax: [phone]   RxID:   [id number] TOPROL XL 200 MG  TB24 (METOPROLOL SUCCINATE) take one daily  #30 x 13   Entered and Authorized by:   Eustaquio Boyden  MD   Signed by:   Eustaquio Boyden  MD on 02/01/2008   Method used:   Electronically sent to ...       [facility]*       [address]       [place], [place]  [address]       Ph: [phone] or [phone]       Fax: [phone]   RxID:   [id number]  ]

## 2011-01-16 NOTE — Assessment & Plan Note (Signed)
Summary: go over tests/eo   Vital Signs:  Patient Profile:   55 Years Old Male Height:     70 inches Weight:      175.0 pounds BMI:     25.20 Temp:     98.9 degrees F oral Pulse rate:   86 / minute BP sitting:   75 / 56  (left arm) Cuff size:   regular  Pt. in pain?   no  Vitals Entered By: Garen Grams LPN (June 24, 2840 3:58 PM)                  PCP:  Eustaquio Boyden  MD  Chief Complaint:  Go over test results.  History of Present Illness: CC: review dexa scan results  1. Dexa scan - h/o osteopenia.  Likely from long term use of phenytoin - last bone scan in 2007 with T of -1.6.  Rpt last month with Tscore of -1.2.  spine improved, hip slight deterioration.  Pt states drinks lots of milk and yogurt, takes Calcium supplementation about 1200 to 1400mg  per day.  Also states takes Vit D 400-800 International Units per day.  Adequate.  Still osteopenic, but improved from last visit.  pt asks about cheaper alternative to OTC Ca/Vit D supplementation.    2. cough - Resolved.  No longer bothering him. Has not yet taken repeat CXR to follow R lung lesion.  3. sz disorder - to see neurology July 14 to comment on adequate use of medications.  Currently taking Dilantin 400mg  once daily, Klonopin 5 mg BID, and Seroquel 300 two times a day, and Doxepin 900 once daily.  Pt very hesitant to change sz meds stating that he is anxious of rpt sz activity.  Medications reviewed and updated in medication list.  Review of systems as above.     Current Allergies: ! SULFA  Past Medical History:    - Depression  (see psych 4x/yr, sees therapist 6x/yr)    - Schizoaffective diorder/ Schizophrenia     -ADMISSIONS 2 TO PSYCHE issues x 2 :                                                                     1- on 09/05  depression with suicidal thoughts and plans                                                                     2-  ~ 1990 for self orchiectomy/suicidal                                            both admissions at [facility], [place], [place]    - Hx of paradoxical reactions to [employer]    - hypogonadism - SELF ORCHIECTOMY  while psychotic yr ( on testosterone IM q 2 wks)    -Osteopenia  2 to hypogonadism and dilantin (REPEAT  BONE DENSITY ON  06/2010)    -Hyperlipidemia    -Hypertension    -Seizures disorder ( REACHS EASILY  TOXIC LEVELS  of dilantin , needs dilantin levels f/u . Seems that alternating 150 mg / 200mg  works well 07/08)    - Rhinitis, Allergic - 477.9    - Hx of bilalteral R > L  De Quervain's tenosynovitis     -  Hx of  L ankle sprain     - creatinine baseline:1.1/1.2      Physical Exam  General:     Well-developed,well-nourished,in no acute distress; alert,appropriate and cooperative throughout examination    Impression & Recommendations:  Problem # 1:  OSTEOPENIA (ICD-733.90) Assessment: Improved Actually improved from Dexa scan in 2007, but still in osteopenic range.  I think it's sufficient for patient to continue with Ca and Vit D supplementation, no need for bisphosphonates at this time.  Advised to try tums which are cheaper supply of calcium.  His updated medication list for this problem includes:    Oscal 500/200 D-3 500-200 Mg-unit Tabs (Calcium-vitamin d) .Marland Kitchen... Take one daily  Orders: [facility]- Est Level  3 ([address])   Problem # 2:  AFTERCARE, LONG-TERM USE, MEDICATIONS NEC (ICD-V58.69) Continues to take Dilantin and Testosterone.  Check at next visit.  Problem # 3:  SHOULDER PAIN, RIGHT (ICD-719.41) Assessment: Improved Still residual, working on exercises.  given rehab exercises to prevent frozen shoulder per patient request.  Problem # 4:  COUGH (ICD-786.2) Assessment: Improved Rpt CXR to be obtained early next week for f/u of lung lesion. Orders: [facility]- Est Level  3 ([address])   Complete Medication List: 1)  Klonopin 2 Mg Tabs (Clonazepam) .... Take 2 and a half tablets by mouth twice a day 2)   Toprol Xl 200 Mg Tb24 (Metoprolol succinate) .Marland Kitchen.. 1 by mouth daily 3)  Zocor 40 Mg Tabs (Simvastatin) .... Take 1 tablet by mouth at bedtime 4)  Depo-testosterone 200 Mg/ml Oil (Testosterone cypionate) .... Inject 400 mg im every 2 weeks 5)  Prilosec Otc 20 Mg Tbec (Omeprazole magnesium) .... One daily 6)  Seroquel 300 Mg Tabs (Quetiapine fumarate) .... Take two by mouth daily 7)  Doxepin Hcl 100 Mg Caps (Doxepin hcl) .... Take nine by mouth daily 8)  Oscal 500/200 D-3 500-200 Mg-unit Tabs (Calcium-vitamin d) .... Take one daily 9)  Dilantin 100 Mg Caps (Phenytoin sodium extended) .... Take 4 by mouth daily   Patient Instructions: 1)  Please go to get a repeat chest xray in the next week. 2)  Continue to eat foods high in calcium like yogurt, milk and cheese, or take calcium supplementation.  I have given you a prescription for Calcium and vitamin D.  Take 3 per day. (Oscal)  If this is not cheaper, you can also take Tums (which have about 600mg  of Calcium per tablet) which are cheaper. 3)  It was a pleasure to see you today.   ]

## 2011-01-16 NOTE — Progress Notes (Signed)
Summary: refill  Phone Note Refill Request Call back at [phone] Message from:  Patient  Refills Requested: Medication #1:  DEPO-TESTOSTERONE 200 MG/ML  OIL inject 400 mg IM every 2 weeks  Medication #2:  KLONOPIN 2 MG TABS Take 2 and a half tablets by mouth twice a day  Medication #3:  DILANTIN INFATABS 50 MG  CHEW take four by mouth once daily pt is now out  Initial call taken by: De Nurse,  August 23, 2010 10:04 AM  Follow-up for Phone Call        to pcp Follow-up by: Golden Circle RN,  August 23, 2010 10:07 AM  Additional Follow-up for Phone Call Additional follow up Details #1::        Pt will have to see me for Klonipin Rx. Rx printed and up at front desk Additional Follow-up by: Clementeen Graham MD,  August 23, 2010 12:24 PM    Prescriptions: DILANTIN INFATABS 50 MG  CHEW (PHENYTOIN) take four by mouth once daily  #120 x 3   Entered and Authorized by:   Clementeen Graham MD   Signed by:   Clementeen Graham MD on 08/23/2010   Method used:   Print then Give to Patient   RxID:   [id number] PRILOSEC OTC 20 MG  TBEC (OMEPRAZOLE MAGNESIUM) one daily  #31 x 1   Entered and Authorized by:   Clementeen Graham MD   Signed by:   Clementeen Graham MD on 08/23/2010   Method used:   Print then Give to Patient   RxID:   [id number] DEPO-TESTOSTERONE 200 MG/ML  OIL (TESTOSTERONE CYPIONATE) inject 400 mg IM every 2 weeks  #2 x 1   Entered and Authorized by:   Clementeen Graham MD   Signed by:   Clementeen Graham MD on 08/23/2010   Method used:   Print then Give to Patient   RxID:   [id number]

## 2011-01-16 NOTE — Progress Notes (Signed)
Summary: triage  Phone Note Call from Patient Call back at Home Phone [phone]   Caller: Patient Summary of Call: Pt has blood when he urinates. Initial call taken by: Clydell Hakim,  July 18, 2010 11:20 AM  Follow-up for Phone Call        started having urgent need to void 2 months ago.  blood in urine x 1 wk. comes & goes. will see Dr. Janalyn Harder this pm. asked him to drink plenty of water Follow-up by: Golden Circle RN,  July 18, 2010 11:28 AM

## 2011-01-16 NOTE — Miscellaneous (Signed)
Summary: Dr. Ilsa Iha records, psychiatry  history of  requiring high dose of doxepin (900mg ) to acheive therapeutic doses because he is a fast metabolizer. (nl range 110-250)  current meds prescribed by psych:  1. doxepin 900mg  at bedtime 2. seroquel 600 mg at bedtime  dx:  1. major depressive disorder, recurrent, severe  psychiatrist: Dr. Valente David at [facility] referral for nerve stimulation: Dr. Helene Kelp in 2006

## 2011-01-16 NOTE — Progress Notes (Signed)
Summary: Appt  Phone Note Call from Patient Call back at [phone]   Reason for Call: Talk to Nurse Summary of Call: pt needs to come in this week to fu on phnemonia - Dr is booked until 4/16 Initial call taken by: [name],  March 03, 2007 8:42 AM  Follow-up for Phone Call        Phone call to pt, he states he is not feeling better.  Has sore throat, nausea, and states he is coughing up "skin" from his throat or lungs.  States Dr. Yetta Barre told him to f/u this week with Dr. [name], she does not have an appt until 03/24/07.   Follow-up by: [name] Vidovich MARTIN RN,  March 03, 2007 10:01 AM

## 2011-01-16 NOTE — Progress Notes (Signed)
Summary: EMERGENCY LINE CALL  Phone Note Call from Patient Call back at [phone]   Caller: Patient Summary of Call: Pt calling re: hemoptysis.  Went to [facility] this am, dx pneumonia, rx doxy and given antibiotic shot in [facility].  Feels symptoms overall improved but just coughed up some blood.  Described as rusty looking.  SOB present but overall improved and he is speaking full sentences.  Advised that rusty colored bloody sputum was common with pneumonia and if symptoms overall improved then I was reassured however he should call [facility] in the AM for SDA to have someone look at him and plant a PPD.  He is amenable to this and will not miss any doses of the antibiotic. Initial call taken by: [name] Langton MD,  October 25, 2010 11:16 PM

## 2011-01-16 NOTE — Assessment & Plan Note (Signed)
Summary: ck for diabetes,tcb   Vital Signs:  Patient profile:   55 year old male Height:      70 inches Weight:      184 pounds BMI:     26.50 Temp:     98.4 degrees F oral Pulse rate:   73 / minute BP sitting:   108 / 74  (right arm) Cuff size:   regular  Vitals Entered By: Tessie Fass CMA (February 02, 2010 2:19 PM) CC: check for diabetes Is Patient Diabetic? No Pain Assessment Patient in pain? no        Primary Care Provider:  Eustaquio Boyden  MD  CC:  check for diabetes.  History of Present Illness: CC: f/u issues  1. chronic cough - 3 months now, coughs up thick mucous.  Doesn't feel weak or sick.  Comes on intermittently.  Nonsmoker, no smokeless tobacco.  No EtOH.  No hemoptysis.  No recent weight changes.  No NS, or f/c.  Appetite normal.  2. exposed to hep c - with girl.  Happened several months ago vaginal bleeding, girl since passed away.  No protection used.  3. wants to get blood sugar tested. as well as cholesterol and triglicerides.  PSA and testosterone.  wants flu shot.  Getting testosterone shots every 2 wks.  Habits & Providers  Alcohol-Tobacco-Diet     Tobacco Status: never  Current Medications (verified): 1)  Klonopin 2 Mg Tabs (Clonazepam) .... Take 2 and A Half Tablets By Mouth Twice A Day 2)  Toprol Xl 200 Mg  Tb24 (Metoprolol Succinate) .Marland Kitchen.. 1 By Mouth Daily 3)  Zocor 40 Mg Tabs (Simvastatin) .... Take 1 Tablet By Mouth At Bedtime 4)  Depo-Testosterone 200 Mg/ml  Oil (Testosterone Cypionate) .... Inject 400 Mg Im Every 2 Weeks 5)  Prilosec Otc 20 Mg  Tbec (Omeprazole Magnesium) .... One Daily 6)  Seroquel 300 Mg  Tabs (Quetiapine Fumarate) .... Take Three By Mouth Daily 7)  Doxepin Hcl 100 Mg  Caps (Doxepin Hcl) .... Take Nine By Mouth Daily 8)  Oscal 500/200 D-3 500-200 Mg-Unit  Tabs (Calcium-Vitamin D) .... Take One Daily 9)  Dilantin Infatabs 50 Mg  Chew (Phenytoin) .... Take Four By Mouth Once Daily 10)  Docusate Sodium 100 Mg Caps  (Docusate Sodium) .... Take One By Mouth Daily 11)  Miralax  Powd (Polyethylene Glycol 3350) .Marland KitchenMarland KitchenMarland Kitchen 17 Gm in 8 Oz Fluid Qdaily As Needed Constipation  Allergies (verified): 1)  ! Sulfa PMH-FH-SH reviewed for relevance  Physical Exam  General:  Well appearing, NAD Lungs:  Normal respiratory effort, chest expands symmetrically. Lungs are clear to auscultation, no crackles or wheezes. Heart:  Normal rate and regular rhythm. S1 and S2 normal without gallop, murmur, click, rub or other extra sounds. Abdomen:  soft, NT, ND Extremities:  No clubbing, cyanosis, edema, or deformity noted with normal full range of motion of all joints.   Skin:  Intact without suspicious lesions or rashes   Impression & Recommendations:  Problem # 1:  HYPERTENSION (ICD-401.9)  stable on toprol xl.  likely somewhat low, consider decreasing next visit to 100mg  daily if patient agrees.  (pt very anxious about medciation chagnes) His updated medication list for this problem includes:    Toprol Xl 200 Mg Tb24 (Metoprolol succinate) .Marland Kitchen... 1 by mouth daily  Orders: Comp Met-[facility] [id number]) [facility]- Est  Level 4 (99214)  BP today: 108/74 Prior BP: 106/78 (08/24/2009)  Labs Reviewed: K+: 4.1 (08/24/2009) Creat: : 1.25 (08/24/2009)   Chol:  127 (08/24/2009)   HDL: 42 (08/24/2009)   LDL: 52 (08/24/2009)   TG: 163 (08/24/2009)  Problem # 2:  HYPERLIPIDEMIA (ICD-272.4)  FLP last visit great.  continue current threapy. His updated medication list for this problem includes:    Zocor 40 Mg Tabs (Simvastatin) .Marland Kitchen... Take 1 tablet by mouth at bedtime  Labs Reviewed: SGOT: 22 (08/24/2009)   SGPT: 33 (08/24/2009)   HDL:42 (08/24/2009), 56 (01/28/2008)  LDL:52 (08/24/2009), 84 (01/28/2008)  Chol:127 (08/24/2009), 160 (01/28/2008)  Trig:163 (08/24/2009), 102 (01/28/2008)  Orders: [facility]- Est  Level 4 (59563)  Problem # 3:  AFTERCARE, LONG-TERM USE, MEDICATIONS NEC (ICD-V58.69) check CMP and dilantin level as previously  checked slightly elevated at 27.  Orders: Comp Met-[facility] [id number]) Dilantin-[facility] ([id number]) [facility]- Est  Level 4 (01601)  Problem # 4:  CONTACT WITH OR EXPOSURE TO VENEREAL DISEASES (ICD-V01.6)  check hepatitis panel per patient's request.  Orders: Hep C Ab-[facility] ([id number]) Hep Bs Ag-[facility] ([id number]) [facility]- Est  Level 4 (37628)  Problem # 5:  COUGH (ICD-786.2)  discussed risks and sxs of lung cancer, pt very low risk.  Will continue to monitor for now.  If continued ,consider CXR.  h/o PNA 2008 or 2009, should have cleared.    Orders: [facility]- Est  Level 4 (99214)  Problem # 6:  HYPOGONADISM, MALE S/P SELF-ORCHIECTOMY (ICD-257.2) check testosterone.  pt states gives himself shots about every 2 1/2 wks. Orders: Testosterone-[facility] ([id number])  Complete Medication List: 1)  Klonopin 2 Mg Tabs (Clonazepam) .... Take 2 and a half tablets by mouth twice a day 2)  Toprol Xl 200 Mg Tb24 (Metoprolol succinate) .Marland Kitchen.. 1 by mouth daily 3)  Zocor 40 Mg Tabs (Simvastatin) .... Take 1 tablet by mouth at bedtime 4)  Depo-testosterone 200 Mg/ml Oil (Testosterone cypionate) .... Inject 400 mg im every 2 weeks 5)  Prilosec Otc 20 Mg Tbec (Omeprazole magnesium) .... One daily 6)  Seroquel 300 Mg Tabs (Quetiapine fumarate) .... Take three by mouth daily 7)  Doxepin Hcl 100 Mg Caps (Doxepin hcl) .... Take nine by mouth daily 8)  Oscal 500/200 D-3 500-200 Mg-unit Tabs (Calcium-vitamin d) .... Take one daily 9)  Dilantin Infatabs 50 Mg Chew (Phenytoin) .... Take four by mouth once daily 10)  Docusate Sodium 100 Mg Caps (Docusate sodium) .... Take one by mouth daily 11)  Miralax Powd (Polyethylene glycol 3350) .Marland KitchenMarland Kitchen. 17 gm in 8 oz fluid qdaily as needed constipation  Other Orders: Influenza Vaccine NON MCR ([id number])  Patient Instructions: 1)  Please return in 3 months for f/u. 2)  We have drawn blood today.  You don't need cholesterol levels until September. 3)  We'll keep an eye on your cough.  if you  start having weight loss or fevers or night sweats, please return to be seen. 4)  Call clinic with questions.  Pleasure to see you today.   Immunizations Administered:  Influenza Vaccine # 1:    Vaccine Type: Fluvax Non-MCR    Site: right deltoid    Mfr: GlaxoSmithKline    Dose: 0.5 ml    Route: IM    Given by: Tessie Fass CMA    Exp. Date: 06/15/2010    Lot #: [id number]    VIS given: 07/10/07 version given February 02, 2010.  Flu Vaccine Consent Questions:    Do you have a history of severe allergic reactions to this vaccine? no    Any prior history of allergic reactions to egg and/or gelatin? no    Do you  have a sensitivity to the preservative Thimersol? no    Do you have a past history of Guillan-Barre Syndrome? no    Do you currently have an acute febrile illness? no    Have you ever had a severe reaction to latex? no    Vaccine information given and explained to patient? yes    Prevention & Chronic Care Immunizations   Influenza vaccine: Fluvax Non-MCR  (02/02/2010)   Influenza vaccine due: 10/28/2008    Tetanus booster: 08/24/2009: Tdap   Tetanus booster due: 08/25/2019    Pneumococcal vaccine: Not documented  Colorectal Screening   Hemoccult: Done.  (11/16/2005)   Hemoccult action/deferral: Not indicated  (08/24/2009)   Hemoccult due: Not Indicated    Colonoscopy: normal  (02/04/2009)   Colonoscopy action/deferral: Repeat colonoscopy in 5 years.  miralax 17gm in 8 oz water 1-4 times per day for constipation  (02/04/2009)   Colonoscopy due: 02/04/2014  Other Screening   PSA: 0.45  (08/24/2009)   PSA action/deferral: Discussed-PSA requested  (08/24/2009)   PSA due due: 07/27/2009   Smoking status: never  (02/02/2010)  Lipids   Total Cholesterol: 127  (08/24/2009)   Lipid panel action/deferral: Lipid Panel ordered   LDL: 52  (08/24/2009)   LDL Direct: Not documented   HDL: 42  (08/24/2009)   Triglycerides: 163  (08/24/2009)    SGOT (AST): 22   (08/24/2009)   BMP action: Ordered   SGPT (ALT): 33  (08/24/2009) CMP ordered    Alkaline phosphatase: 66  (08/24/2009)   Total bilirubin: 0.3  (08/24/2009)    Lipid flowsheet reviewed?: Yes   Progress toward LDL goal: At goal  Hypertension   Last Blood Pressure: 108 / 74  (02/02/2010)   Serum creatinine: 1.25  (08/24/2009)   BMP action: Ordered   Serum potassium 4.1  (08/24/2009) CMP ordered     Hypertension flowsheet reviewed?: Yes   Progress toward BP goal: At goal  Self-Management Support :   Personal Goals (by the next clinic visit) :      Personal blood pressure goal: 140/90  (02/02/2010)     Personal LDL goal: 130  (08/24/2009)    Hypertension self-management support: Not documented    Lipid self-management support: Not documented

## 2011-01-16 NOTE — Progress Notes (Signed)
Summary: Requesting rx  Phone Note Call from Patient Call back at Home Phone [phone]   Reason for Call: Refill Medication Summary of Call: pt is calling re: his medication, sts he is suppose to have an rx for injectable testosterone, sts if he doesn't get it his cholesterol will rise. pt needs [facility] Initial call taken by: ERIN LEVAN,  January 26, 2008 2:41 PM  Follow-up for Phone Call        Will forward to MD Follow-up by: ASHA BENTON LPN,  January 26, 2008 2:49 PM  Additional Follow-up for Phone Call Additional follow up Details #1::        sent.  plz call pt and let him know.  thanks!! Additional Follow-up by: Eustaquio Boyden  MD,  January 27, 2008 12:22 PM    Additional Follow-up for Phone Call Additional follow up Details #2::    LMOVM informing pt Follow-up by: [facility] CMA,  January 27, 2008 3:17 PM  New/Updated Medications: DEPO-TESTOSTERONE 200 MG/ML  OIL (TESTOSTERONE CYPIONATE) inject 400 mg IM every 2 weeks   Prescriptions: DEPO-TESTOSTERONE 200 MG/ML  OIL (TESTOSTERONE CYPIONATE) inject 400 mg IM every 2 weeks  #3 x 12   Entered and Authorized by:   Eustaquio Boyden  MD   Signed by:   Eustaquio Boyden  MD on 01/27/2008   Method used:   Electronically sent to ...       [facility]*       [address]       [place], [place]  [address]       Ph: [phone] or [phone]       Fax: [phone]   RxID:   [id number]

## 2011-01-16 NOTE — Letter (Signed)
Summary: *Referral Letter  [facility]  [address]   [place], [place] [address]   Phone: [phone]  Fax: [phone]    04/15/2008  Thank you in advance for agreeing to see my patient:  [name] [address] [place], [place]  [address]  Phone: [phone]  Reason for Referral:  [name] is a 55 yo male patient new to me but established of our practice who was previously seen by Dr. Buzzy [name] and last seen by Dr. Noreene [name] in 2000.  He has history of schizophrenia and depression with multiple psychiatric hospitalizations, recently well controlled on Doxepin 900mg  and Seroquel 600mg  daily per psychiatry.  He also has history of self-castration for which he is on testosterone supplementation.  Pt also has history of partial complex seizures and has been taking Dilantin 200mg  alternating with 150mg  daily and Klonopin 4mg  two times a day for seizure control, with abscence of seizure activity for 7+ years, and therapeutic levels of phenytoin on checks.  Concern for repeat seizure activity on 04/06/08, to which pt increased Klonopin to 4mg  three times a day.  Currently on Klonopin 5mg  two times a day and same Dilantin dose.  Unsure if Klonopin is necessary to control these seizures.  He has been advised not to drive until neurology evaluation.  Please evaluate him for new onset of seizure activity in previously effective regimen and appropriateness of medication changes.  Procedures Requested:  As seen fit. Current Medical Problems: 1)  DEPRESSION, CHRONIC, SEVERE (ICD-296.33) 2)  HYPERLIPIDEMIA (ICD-272.4) 3)  HYPOGONADISM, MALE (ICD-257.2) 4)  SEIZURE DISORDER, HX OF (ICD-V12.49) 5)  HYPERTENSION NEC (ICD-997.91) 6)  AFTERCARE, LONG-TERM USE, MEDICATIONS NEC (ICD-V58.69) 7)  PREVENTIVE HEALTH CARE (ICD-V70.0) 8)  OSTEOPENIA (ICD-733.90) 9)  GERD (ICD-530.81)   Current Medications: 1)  KLONOPIN 2 MG TABS (CLONAZEPAM) Take 2 tablet by mouth twice a  day 2)  TOPROL XL 200 MG  TB24 (METOPROLOL SUCCINATE) 1 by mouth daily 3)  ZOCOR 40 MG TABS (SIMVASTATIN) Take 1 tablet by mouth at bedtime 4)  DEPO-TESTOSTERONE 200 MG/ML  OIL (TESTOSTERONE CYPIONATE) inject 400 mg IM every 2 weeks 5)  PRILOSEC OTC 20 MG  TBEC (OMEPRAZOLE MAGNESIUM) one daily 6)  SEROQUEL 300 MG  TABS (QUETIAPINE FUMARATE) take two by mouth daily 7)  DOXEPIN HCL 100 MG  CAPS (DOXEPIN HCL) take nine by mouth daily 8)  OSCAL 500/200 D-3 500-200 MG-UNIT  TABS (CALCIUM-VITAMIN D) take one daily 9)  DILANTIN 100 MG  CAPS (PHENYTOIN SODIUM EXTENDED) take 4 by mouth daily   Past Medical History: 1)  -Depression  (see psych 4x/yr, sees therapist 6x/yr) 2)  - Schizoaffective diorder/ Schizophrenia  3)  -ADMISSIONS 2 TO PSYCHE issues x 2 :  4)                                                                  1- on 09/05  depression with suicidal thoughts and plans  5)  2-  ~ 1990 for self orchiectomy/suicidal  6)                                                                  both admissions at [facility], [place], [place] 7)  - Hx of paradoxical reactions to SSRI's 8)  - hypogonadism - SELF ORCHIECTOMY  while psychotic yr ( on testosterone IM q 2 wks) 9)  -Osteopenia  2 to hypogonadism and dilantin (REPEAT BONE DENSITY ON  01/2008) 10)  -Hyperlipidemia 11)  -Hypertension 12)  -Seizures disorder ( REACHS EASILY  TOXIC LEVELS  of dilantin , needs dilantin levels f/u . Seems that alternating 150 mg / 200mg  works well 07/08) 13)  - Rhinitis, Allergic - 477.9 14)  - Hx of bilalteral R > L  De Quervain's tenosynovitis  15)  -  Hx of  L ankle sprain  16)  - creatinine baseline:1.1/1.2   Prior History of Blood Transfusions:  none Pertinent Labs:  LFTs WNL 06/2007 BMP WNL 01/2008  Thank you again for agreeing to see our patient; please contact us if you have any further questions or need additional  information.  Sincerely,  Eustaquio Boyden  MD  Appended Document: *Referral Letter faxed letter and demographics to [phone]

## 2011-01-16 NOTE — Assessment & Plan Note (Signed)
Summary: SEIZURES/KH per Echo Propp   Vital Signs:  Patient Profile:   55 Years Old Male Height:     70 inches Weight:      176 pounds Pulse rate:   76 / minute BP sitting:   109 / 62  Vitals Entered By: Lillia Pauls CMA (April 15, 2008 1:37 PM)                 PCP:  Eustaquio Boyden  MD  Chief Complaint:  fu seizures.  History of Present Illness: 55 yo male patient of mine presents for follow up of repeat seizure activity.  Pt states seizure occurred 4/21, when he was laying in bed watching TV, then suddenly found himself on the floor by the bed with scratches on his right leg.  Pt denies bowel/bladder incontinence, no preceding aura, no HA afterwards nor confusion.  Pt with history of complex partial seizures previously, started on anticonvulsant medications in early 1990s.  Has been on Dilantin 400mg  QHS and Klonopin 4mg  BID.  Slowly self-tapered Konopin to 4mg  TID because of concern for repeat seizure.  Also significant history of schizophrenia/depression on Doxepin and seroquel, and hypogonadism on testosterone supplementation.  Pt also interested in referral to PT to work on exercise, as he thinks this will help his depression.    Current Allergies: ! SULFA  Past Medical History:    Reviewed history from 07/10/2007 and no changes required:       -Depression  (see psych 4x/yr, sees therapist 6x/yr)       - Schizoaffective diorder/ Schizophrenia        -ADMISSIONS 2 TO PSYCHE issues x 2 :                                                                        1- on 09/05  depression with suicidal thoughts and plans                                                                        2-  ~ 1990 for self orchiectomy/suicidal                                                                        both admissions at [facility], [place], [place]       - Hx of paradoxical reactions to [employer]              - hypogonadism - SELF ORCHIECTOMY  while psychotic yr ( on  testosterone IM q 2 wks)       -Osteopenia  2 to hypogonadism and dilantin (REPEAT BONE DENSITY ON  01/2008)              -Hyperlipidemia       -  Hypertension              -Seizures disorder ( REACHS EASILY  TOXIC LEVELS  of dilantin , needs dilantin levels f/u . Seems that alternating 150 mg / 200mg  works well 07/08)              - Rhinitis, Allergic - 477.9              - Hx of bilalteral R > L  De Quervain's tenosynovitis        -  Hx of  L ankle sprain               - creatinine baseline:1.1/1.2      Physical Exam  General:     Well-developed,well-nourished,in no acute distress; alert,appropriate and cooperative throughout examination Heart:     Normal rate and regular rhythm. S1 and S2 normal without gallop, murmur, click, rub or other extra sounds.  no carotid bruit Neurologic:     No cranial nerve deficits noted. Station and gait are normal. Sensory, motor and coordinative functions appear intact. Psych:     flattened affect    Impression & Recommendations:  Problem # 1:  SEIZURE DISORDER, HX OF (ICD-V12.49) Assessment: Deteriorated Established , worsened.  Concern for repeat seizure activity.  Referral to neurology for recs on meds/re-evaluation.  Pt is currently therapeutic on dilantin.  Concern with self-increase of klonopin.  Advised pt slowly taper down to max of 5mg  two times a day.  I want pt to return to 4mg  BID at max.  Also advised pt not drive until seen by neurology.    Orders: [facility]- Est Level  3 (59563) Neurology Referral (Neuro)   Complete Medication List: 1)  Klonopin 2 Mg Tabs (Clonazepam) .... Take 2 and a half tablets by mouth twice a day 2)  Toprol Xl 200 Mg Tb24 (Metoprolol succinate) .Marland Kitchen.. 1 by mouth daily 3)  Zocor 40 Mg Tabs (Simvastatin) .... Take 1 tablet by mouth at bedtime 4)  Depo-testosterone 200 Mg/ml Oil (Testosterone cypionate) .... Inject 400 mg im every 2 weeks 5)  Prilosec Otc 20 Mg Tbec (Omeprazole magnesium) .... One daily 6)   Seroquel 300 Mg Tabs (Quetiapine fumarate) .... Take two by mouth daily 7)  Doxepin Hcl 100 Mg Caps (Doxepin hcl) .... Take nine by mouth daily 8)  Oscal 500/200 D-3 500-200 Mg-unit Tabs (Calcium-vitamin d) .... Take one daily 9)  Dilantin 100 Mg Caps (Phenytoin sodium extended) .... Take 4 by mouth daily   Patient Instructions: 1)  We will contact you for your appoitnment with Neurology. 2)  Please do not drive until you see neurology. 3)  Your dilantin level was in the normal range. 4)  Go down on your Klonopin use to 10 mg daily (5mg  twice daily), first to 11mg  daily for a week, then to 10mg  daily. 5)  Please call us with any questions. 6)  [employer] has a [employer] nearby you can call for activities and exercise.  You can also call [employer] at phone # [phone] for more referrals for activities.    ]

## 2011-01-16 NOTE — Progress Notes (Signed)
Summary: flu-like symptoms  Phone Note Call from Patient   Caller: Patient Summary of Call: Patient c/o 1 day h/o myalgias and fever to 102, also anorexia. He reports mild nasal congestion but no SOB, N/v, rashes, HA, syncope, or lightheadedness.  No dysuria or focal pain. No recent medication changes. Did get seasonal flu shot. Did not get H1N1 shot.  Advised likely flu vs virus and to push fluids and rest.  Ibuprofen as needed.  Gave red flags including trouble breathing, unable to tolerate by mouth, worsening body aches, or no resolution in 2-3 days. He understands.  Initial call taken by: Lupita Raider MD,  December 12, 2008 8:31 PM      Appended Document: flu-like symptoms pt is wanting to speak with rn about this, pt is not feeling any better, states it is an emergency. [phone].  Appended Document: flu-like symptoms states he went to ED 2 days ago & they were going to admit him for "scar tissue in my colon" he refused & left the hospital because they were not going to give him his antidepressants. states he "decompensates fast" wanted to know if he should come see his md here for f/u onhis colon. does not have a GI md. told him I will send a message to pcp to check ED visit notes & ask if referral to GI is needed or if he needs to come back here.  Appended Document: flu-like symptoms i would like to see pt prior to GI referral.  thx.  Appended Document: flu-like symptoms appt made for january 14th. meantime instructed him to eat a high fiber diet & gave examples. told him miralax or colace may help.

## 2011-01-16 NOTE — Assessment & Plan Note (Signed)
Summary: fu and scalp and acid reflux wk   Vital Signs:  Patient Profile:   55 Years Old Male Weight:      193 pounds Pulse rate:   72 / minute BP sitting:   110 / 82  Vitals Entered By: Lillia Pauls CMA (May 20, 2007 11:05 AM)               Chief Complaint:  recent episodes of reflux.  History of Present Illness: 55 yo c/o sour mouth taste at night when he is lying down flat in bed. Some relief with Tums. Denies epigastric / abd. pain, n, v.  HE also c/o some scalp descamtion, non pruritic. No apparent lesions.   Current Allergies: ! SULFA      Physical Exam  General:     Well-developed,well-nourished,in no acute distress; alert,appropriate and cooperative throughout examination Lungs:     Normal respiratory effort, chest expands symmetrically. Lungs are clear to auscultation, no crackles or wheezes. Heart:     Normal rate and regular rhythm. S1 and S2 normal without gallop, murmur, click, rub or other extra sounds. Abdomen:     Bowel sounds positive,abdomen soft and non-tender without masses, organomegaly or hernias noted. Skin:     some scales on scalp. samples sent for KOH.    Impression & Recommendations:  Problem # 1:  GERD (ICD-530.81) Sour taste in mouth with some burning (  1/ 3 upper substernal ) increase with spicy food and relieve by tums most likelyl 2 to reflux. Prevention measures for reflux ( see instructions). Pt prefers to continue tums prn. If he falis with prevention/ and tums , he will try PPI.F/u in 1-2 months: [facility]- Est  Level 4 (99214)   Problem # 2:  DANDRUFF (ICD-690.18) KOH negative. Head and shoulders shampoo. Orders: KOH-[facility] (87220) [facility]- Est  Level 4 (99214)   Problem # 3:  HYPERKALEMIA (ICD-276.7) INcrease K last visit . BMET pending for f/u Orders: Basic Met-[facility] [phone]) [facility]- Est  Level 4 (14782)   Problem # 4:  Preventive Health Care (ICD-V70.0) next visit prostate check. PSA pending  Other Orders: PSA-[facility]  ([id number])   Patient Instructions: 1)  Reduce cafeine , fat, chocolate, spicy food intake. 2)  Allow at least 3 hours after dinner before you go to bed. Elevate the head of your bed. Tums as needed. 3)  Return in 2 months for follow up reflux, and prostate/ hypertension check  Laboratory Results  Date/Time Received: May 20, 2007 12:08  PM  Date/Time Reported: May 20, 2007 12:21 PM   Other Tests  Skin KOH: Negative Comments ...............test performed by......Marland KitchenBonnie A. [place], [place] (ASCP)

## 2011-01-16 NOTE — Assessment & Plan Note (Signed)
Summary: Pneumoniua FU/kf   Vital Signs:  Patient profile:   55 year old male Weight:      189.2 pounds O2 Sat:      95 % on Room air Temp:     97.2 degrees F oral Pulse rate:   84 / minute Resp:     20 per minute BP sitting:   110 / 74  Vitals Entered By: Arlyss Repress CMA, (October 26, 2010 1:40 PM)  O2 Flow:  Room air CC: f/up pneumonia. seen at [facility]. Is Patient Diabetic? No Pain Assessment Patient in pain? no        Primary Care Provider:  Clementeen Graham MD  CC:  f/up pneumonia. seen at [facility].Marland Kitchen  History of Present Illness: [name] presents to clinic today to follow up pneumonia.  He was seen at the urgent care center yesterday and diagnosed with lobar pneumonia. He awoke 09/24/10 (yesterday) with a cough productive of blood tinged spitum. He also felt somewhat short of breath and went to urgent care. At that time he was diagnosed clinically and with a CXR. He was given a shot of something and a rx for doxycycline. He took the doxycycline yesterday and presents to clinic today as instructed.  He denies any fever, chills, or severe dyspnea. He does note some subjective tachypnea. He also denies any chest pain. He is able to eat and drink. His main complaint is weakness.   Habits & Providers  Alcohol-Tobacco-Diet     Tobacco Status: never  Current Problems (verified): 1)  Unspecified Bacterial Pneumonia  (ICD-482.9) 2)  Paresthesia  (ICD-782.0) 3)  Hematuria Unspecified  (ICD-599.70) 4)  Hypertension  (ICD-401.9) 5)  Hyperlipidemia  (ICD-272.4) 6)  Depression, Chronic, Severe  (ICD-296.33) 7)  Seizure Disorder, Hx of  (ICD-V12.49) 8)  Aftercare, Long-term Use, Medications Nec  (ICD-V58.69) 9)  Hypogonadism, Male S/p Self-orchiectomy  (ICD-257.2) 10)  Osteopenia  (ICD-733.90) 11)  Other Specified Disorders of Liver  (ICD-573.8) 12)  Unspecified Intestinal Obstruction  (ICD-560.9) 13)  Overweight  (ICD-278.02) 14)  Gerd  (ICD-530.81) 15)  Preventive Health Care   (ICD-V70.0)  Current Medications (verified): 1)  Klonopin 2 Mg Tabs (Clonazepam) .... Take 2 and A Half Tablets By Mouth Twice A Day. 2)  Toprol Xl 200 Mg  Tb24 (Metoprolol Succinate) .Marland Kitchen.. 1 By Mouth Daily 3)  Zocor 40 Mg Tabs (Simvastatin) .... Take 1 Tablet By Mouth At Bedtime 4)  Depo-Testosterone 200 Mg/ml  Oil (Testosterone Cypionate) .... Inject 400 Mg Im Every 2 Weeks 5)  Prilosec Otc 20 Mg  Tbec (Omeprazole Magnesium) .... One Daily 6)  Seroquel 300 Mg  Tabs (Quetiapine Fumarate) .... Take Three By Mouth Daily 7)  Doxepin Hcl 100 Mg  Caps (Doxepin Hcl) .... Take Nine By Mouth Daily 8)  Dilantin Infatabs 50 Mg  Chew (Phenytoin) .... Take Four By Mouth Once Daily 9)  Oscal 500/200 D-3 500-200 Mg-Unit  Tabs (Calcium-Vitamin D) .... Take One Daily 10)  Docusate Sodium 100 Mg Caps (Docusate Sodium) .... Take One By Mouth Daily 11)  Miralax  Powd (Polyethylene Glycol 3350) .Marland KitchenMarland KitchenMarland Kitchen 17 Gm in 8 Oz Fluid Qdaily As Needed Constipation 12)  Monoject Syringe 23g X 1" 3 Ml Misc (Syringe/needle (Disp)) .... Use As Directed.  Qs 1 Month 13)  Cortisporin 3.5-10000-1 Soln (Neomycin-Polymyxin-Hc) .... 3 Drops in Right Ear Three Times A Day For 7 Days 14)  Avelox 400 Mg Tabs (Moxifloxacin Hcl) .Marland Kitchen.. 1 By Mouth Daily  Allergies (verified): 1)  ! Sulfa  Past History:  Past Medical History: Last updated: 05/24/2010 - Abd pain ER visit 12/07/08 - obstruction but pt left AMA with concern he would not receive depression/seizure meds correctly.  CT scan showing colon stricture/neoplasm and liver lesion, however thought to just be edema by GI and colonoscopy at f/u (2010) showed adenomatous polyp resected (rpt 5 yrs) - Depression  (see psych 4x/yr, sees therapist 6x/yr) - Schizoaffective diorder/ Schizophrenia  -ADMISSIONS to inpt psych: 1- on 09/05  depression with suicidal thoughts and plans 2-  ~ 1990 for self orchiectomy/suicidal (both admissions at [facility], [place], [place]) - Hx of paradoxical  reaction to [employer] - hypogonadism - SELF ORCHIECTOMY  while psychotic (on testosterone IM q 2 wks) - Osteopenia (T -1.5 2007) 2/2 hypogonadism and dilantin - Hyperlipidemia - Hypertension - Seizures disorder (REACHES EASILY TOXIC LEVELS  of dilantin , needs dilantin levels f/u.) Seems that alternating 150 mg / 200mg  works well 07/08 - Rhinitis, Allergic - 477.9 - Hx of bilalteral R > L  De Quervain's tenosynovitis  - Hx of  L ankle sprain  - creatinine baseline:1.1/1.2  Past Surgical History: Last updated: 09/08/2009 Bone Density T:- 1.6 - 01/31/2006; T -1.6 08/2009.  consider rpt 2012 lipid panel 2/08: TC=138, TG=91, HDL=55, LDL=65 sigmoidoscopy (nl) - 03/18/1999 colonoscopy 2010 - adenomatous polyp resected (rpt 2015)  Family History: Last updated: 12/30/2008 -DM 2 -Oteoporosis (parents and brother) - Father had CVA 11/2008  Social History: Last updated: 09/20/2010 Unemployed. Disability. Single. No children.  broke up with girlfriend (10/08) and this caused him much anxiety as he was afraid she was out to get him.  Feeling better from this now.   denies smoking, EtOH, rec drugs.  Risk Factors: Smoking Status: never (10/26/2010)  Family History: Reviewed history from 12/30/2008 and no changes required. -DM 2 -Oteoporosis (parents and brother) - Father had CVA 11/2008  Social History: Reviewed history from 09/20/2010 and no changes required. Unemployed. Disability. Single. No children.  broke up with girlfriend (10/08) and this caused him much anxiety as he was afraid she was out to get him.  Feeling better from this now.   denies smoking, EtOH, rec drugs.  Review of Systems       The patient complains of dyspnea on exertion, prolonged cough, hemoptysis, and muscle weakness.  The patient denies anorexia, fever, weight loss, decreased hearing, hoarseness, chest pain, syncope, peripheral edema, headaches, abdominal pain, melena, severe indigestion/heartburn, suspicious skin  lesions, difficulty walking, depression, and enlarged lymph nodes.    Physical Exam  General:  VS noted.  Fatigued male in NAD Mouth:  MMM, wearing mask Lungs:  Mild tachypnea.  Normal WOB. Clear in left lung and right upper.  Crackles present in right lower lung.  No wheezes noted.  Heart:  Normal rate and regular rhythm. S1 and S2 normal without gallop, murmur, click, rub or other extra sounds. Abdomen:  Bowel sounds positive,abdomen soft and non-tender without masses, organomegaly or hernias noted. Pulses:  2+ radial BL Extremities:  Non edemetus BL LE and BL LE  and UE are warm and well perfused. Cervical Nodes:  No lymphadenopathy noted Axillary Nodes:  No palpable lymphadenopathy   Impression & Recommendations:  Problem # 1:  UNSPECIFIED BACTERIAL PNEUMONIA (ICD-482.9) Assessment New  Pt with lobar pneumonia. Most likely bacterial and strep pneumo.   Clinically the patient is well however he could deterioriate.  Pulse Ox is 95% and rr is 20.  BP and HR are WNL.  Pt tolerating P.O. and  feels OK to return to home.  Do not think I need to hospitilize this patient at this time. No O2 requirment and no IVF or IV ABXrequirements currently. Plan: Will change P.O. ABX to Avelox for 10 day course. Will also repeat ceftriaxone 1g IM shot today for a total of 2 days of coverage.  Will have patient return to clinic tomorrow (Friday 11th) for a recheck before the weekend.  Patient should follow up in 1-2 weeks with me and get a PPD at that point.  Discussed with Bowen.   His updated medication list for this problem includes:    Avelox 400 Mg Tabs (Moxifloxacin hcl) .Marland Kitchen... 1 by mouth daily  Orders: [facility]- Est Level  3 (30160)  Complete Medication List: 1)  Klonopin 2 Mg Tabs (Clonazepam) .... Take 2 and a half tablets by mouth twice a day. 2)  Toprol Xl 200 Mg Tb24 (Metoprolol succinate) .Marland Kitchen.. 1 by mouth daily 3)  Zocor 40 Mg Tabs (Simvastatin) .... Take 1 tablet by mouth at  bedtime 4)  Depo-testosterone 200 Mg/ml Oil (Testosterone cypionate) .... Inject 400 mg im every 2 weeks 5)  Prilosec Otc 20 Mg Tbec (Omeprazole magnesium) .... One daily 6)  Seroquel 300 Mg Tabs (Quetiapine fumarate) .... Take three by mouth daily 7)  Doxepin Hcl 100 Mg Caps (Doxepin hcl) .... Take nine by mouth daily 8)  Dilantin Infatabs 50 Mg Chew (Phenytoin) .... Take four by mouth once daily 9)  Oscal 500/200 D-3 500-200 Mg-unit Tabs (Calcium-vitamin d) .... Take one daily 10)  Docusate Sodium 100 Mg Caps (Docusate sodium) .... Take one by mouth daily 11)  Miralax Powd (Polyethylene glycol 3350) .Marland KitchenMarland Kitchen. 17 gm in 8 oz fluid qdaily as needed constipation 12)  Monoject Syringe 23g X 1" 3 Ml Misc (Syringe/needle (disp)) .... Use as directed.  qs 1 month 13)  Cortisporin 3.5-10000-1 Soln (Neomycin-polymyxin-hc) .... 3 drops in right ear three times a day for 7 days 14)  Avelox 400 Mg Tabs (Moxifloxacin hcl) .Marland Kitchen.. 1 by mouth daily  Patient Instructions: 1)  Thank you for seeing me today. 2)  Please come back or go to the hospital if you become short of breath or have persistant chest pain.  3)  If you have chest pain, difficulty breathing, fevers over 102 that does not get better with tylenol please call us or see a doctor.  4)  Stop taking the doxycycline and start taking the avalox daily x 10days.  5)  Please make an appointment to be seen tomorrowto check on you before the weekend.  6)  Keep up with your fluid intake.  7)  Call for questions or concerns.  Prescriptions: AVELOX 400 MG TABS (MOXIFLOXACIN HCL) 1 by mouth daily  #10 x 0   Entered and Authorized by:   Clementeen Graham MD   Signed by:   Clementeen Graham MD on 10/26/2010   Method used:   Electronically to        [facility]       [address]       [place], [place]  [address]       Ph: [phone] or [phone]       Fax: [phone]   RxID:   [id number]    Orders Added: 1)  [facility]- Est Level  3 [03500]  Appended  Document: Pneumoniua FU/kf     Allergies: 1)  ! Sulfa   Complete Medication List: 1)  Klonopin 2 Mg Tabs (Clonazepam) .... Take  2 and a half tablets by mouth twice a day. 2)  Toprol Xl 200 Mg Tb24 (Metoprolol succinate) .Marland Kitchen.. 1 by mouth daily 3)  Zocor 40 Mg Tabs (Simvastatin) .... Take 1 tablet by mouth at bedtime 4)  Depo-testosterone 200 Mg/ml Oil (Testosterone cypionate) .... Inject 400 mg im every 2 weeks 5)  Prilosec Otc 20 Mg Tbec (Omeprazole magnesium) .... One daily 6)  Seroquel 300 Mg Tabs (Quetiapine fumarate) .... Take three by mouth daily 7)  Doxepin Hcl 100 Mg Caps (Doxepin hcl) .... Take nine by mouth daily 8)  Dilantin Infatabs 50 Mg Chew (Phenytoin) .... Take four by mouth once daily 9)  Oscal 500/200 D-3 500-200 Mg-unit Tabs (Calcium-vitamin d) .... Take one daily 10)  Docusate Sodium 100 Mg Caps (Docusate sodium) .... Take one by mouth daily 11)  Miralax Powd (Polyethylene glycol 3350) .Marland KitchenMarland Kitchen. 17 gm in 8 oz fluid qdaily as needed constipation 12)  Monoject Syringe 23g X 1" 3 Ml Misc (Syringe/needle (disp)) .... Use as directed.  qs 1 month 13)  Cortisporin 3.5-10000-1 Soln (Neomycin-polymyxin-hc) .... 3 drops in right ear three times a day for 7 days 14)  Avelox 400 Mg Tabs (Moxifloxacin hcl) .Marland Kitchen.. 1 by mouth daily  Other Orders: Rocephin  250mg  (Y7062)   Medication Administration  Injection # 1:    Medication: Rocephin  250mg     Diagnosis: UNSPECIFIED BACTERIAL PNEUMONIA (ICD-482.9)    Route: IM    Site: RUOQ gluteus    Exp Date: 04/16/2013    Lot #: [id number]    Mfr: hospira    Comments: 1 gram given    Patient tolerated injection without complications    Given by: Tessie Fass CMA (October 26, 2010 2:41 PM)  Orders Added: 1)  Rocephin  250mg  [T5176]

## 2011-01-16 NOTE — Progress Notes (Signed)
Summary: status of rx  Phone Note Call from Patient Call back at Home Phone [phone]   Reason for Call: Refill Medication Summary of Call: pt is checking status of klonopin, pharmacy has faxed several times Initial call taken by: Knox Royalty,  May 17, 2008 4:40 PM  Follow-up for Phone Call        Spoke with pt- he states he has enough klonopin to last 2 days.  Will forward to Dr. [name]. Follow-up by: AMY MARTIN RN,  May 17, 2008 4:46 PM      Prescriptions: KLONOPIN 2 MG TABS (CLONAZEPAM) Take 2 and a half tablets by mouth twice a day  #75 x 1   Entered and Authorized by:   Eustaquio Boyden  MD   Signed by:   Golden Circle RN on 05/18/2008   Method used:   Printed then faxed to ...       [facility]*       [address]       [place], [place]  [address]       Ph: [phone]       Fax: [phone]   RxID:   [id number]    faxed refill.Golden Circle RN  May 18, 2008 8:31 AM

## 2011-01-16 NOTE — Letter (Signed)
Summary: Results Follow-up Letter  [facility]  [address]   [place], [place] [address]   Phone: [phone]  Fax: [phone]    09/08/2009  [address] [place], [place]  [address]  Dear Mr. KYLLO,   The following are the results of your recent test(s): Dexa scan: T score of -1.6  which is the same as your results in 2007.  Continue the calcium supplementation.   Call clinic with questions.  _________________________________________________________   Sincerely,  Eustaquio Boyden  MD [facility]

## 2011-01-16 NOTE — Progress Notes (Signed)
Summary: Xrays  Phone Note Call from Patient Call back at [phone]   Reason for Call: Lab or Test Results Summary of Call: Pt is requesting xray results from 8/13. Initial call taken by: Haydee Salter,  August 04, 2008 4:21 PM  Follow-up for Phone Call        Is cehcking status.  Advised I would send message to triage nurse, as she would get it taken care of and call him back. Follow-up by: Haydee Salter,  August 10, 2008 11:11 AM  Additional Follow-up for Phone Call Additional follow up Details #1::        left message that everything was fine. to call if he needs to discuss further Additional Follow-up by: Golden Circle RN,  August 10, 2008 11:12 AM

## 2011-01-16 NOTE — Miscellaneous (Signed)
Summary: triage call  Clinical Lists Changes patient called in earlier this AM stating he was very short of breath .  states this started this AM.  patient sounds short of breath over the phone. since we have no available appointments this AM advised patient to go to urgent care or ED now. advised to have someone drive him . he voices understanding. Theresia Lo RN  October 25, 2010 1:52 PM   Appended Document: triage call Attempted to call pt to set up an [facility] follow - up appt.  Phone number not valid.  Dennison Nancy RN  October 25, 2010 4:35 PM

## 2011-01-16 NOTE — Progress Notes (Signed)
Summary: Rx Req  Phone Note Refill Request Call back at [phone] Message from:  Patient  Refills Requested: Medication #1:  DILANTIN INFATABS 50 MG  CHEW take four by mouth once daily [facility]  Initial call taken by: Clydell Hakim,  August 28, 2010 12:05 PM  Follow-up for Phone Call        will forward to MD. Follow-up by: Theresia Lo RN,  August 28, 2010 12:10 PM    Prescriptions: DILANTIN INFATABS 50 MG  CHEW (PHENYTOIN) take four by mouth once daily  #120 x 3   Entered and Authorized by:   Clementeen Graham MD   Signed by:   Clementeen Graham MD on 08/29/2010   Method used:   Electronically to        [facility] ([facility]       [address]       [place], [place]  [address]       Ph: [phone] or [phone]       Fax: [phone]   RxID:   [id number]

## 2011-01-16 NOTE — Miscellaneous (Signed)
Summary: Dilantin, Zocor refill faxed to [facility]  Clinical Lists Changes  Medications: Rx of DILANTIN 100 MG  CAPS (PHENYTOIN SODIUM EXTENDED) take 4 by mouth daily;  #120 x 12;  Signed;  Entered by: Eustaquio Boyden  MD;  Authorized by: Eustaquio Boyden  MD;  Method used: Faxed to [facility]*, [address] [place], [place]  [id number], Ph: [phone], Fax: [phone] Rx of ZOCOR 40 MG TABS (SIMVASTATIN) Take 1 tablet by mouth at bedtime;  #30 x 12;  Signed;  Entered by: Eustaquio Boyden  MD;  Authorized by: Eustaquio Boyden  MD;  Method used: Faxed to [facility]*, [address] [place], [place]  [address], Ph: [phone], Fax: [phone]    Prescriptions: ZOCOR 40 MG TABS (SIMVASTATIN) Take 1 tablet by mouth at bedtime  #30 x 12   Entered and Authorized by:   Eustaquio Boyden  MD   Signed by:   Eustaquio Boyden  MD on 07/19/2008   Method used:   Faxed to ...       [facility]*       [address]       [place], [place]  [address]       Ph: [phone]       Fax: [phone]   RxID:   [id number] DILANTIN 100 MG  CAPS (PHENYTOIN SODIUM EXTENDED) take 4 by mouth daily  #120 x 12   Entered and Authorized by:   Eustaquio Boyden  MD   Signed by:   Eustaquio Boyden  MD on 07/19/2008   Method used:   Faxed to ...       [facility]*       [address]       [place], [place]  [address]       Ph: [phone]       Fax: [phone]   RxID:   [id number]

## 2011-01-16 NOTE — Progress Notes (Signed)
Summary: Xray results  Phone Note Call from Patient Call back at [facility] Phone [phone]   Summary of Call: Would like xray results from chest xray.  Has scheduled appt on 07/22/08 @ 4:30 Initial call taken by: Haydee Salter,  July 12, 2008 11:36 AM  Follow-up for Phone Call        Informed patient that that his lungs looked good and that his pnuemonia has almost completely resolved. Patient expressed understanding. Follow-up by: Kyl Givler LPN,  July 12, 2008 12:04 PM

## 2011-01-16 NOTE — Progress Notes (Signed)
Summary: Status of rx  Phone Note Call from Patient Call back at Home Phone [phone]   Reason for Call: Refill Medication Summary of Call: pt is checking the status of a refill request for klonopin and depo-testosterone, pt goes to [facility] Initial call taken by: ERIN LEVAN,  January 21, 2008 10:16 AM  Follow-up for Phone Call        Will forward to MD Follow-up by: ASHA BENTON LPN,  January 21, 2008 11:12 AM  Additional Follow-up for Phone Call Additional follow up Details #1::        pt is calling re: this medication, sts he needs this ASAP, if he doesn't get it he is afraid he will have a seizure. Additional Follow-up by: ERIN LEVAN,  January 22, 2008 10:14 AM    Additional Follow-up for Phone Call Additional follow up Details #2::    told him we just got the request yesterday. told him I would notify him when it was done. He has a "few days left" at home. message to md Follow-up by: Golden Circle RN,  January 22, 2008 10:15 AM  Additional Follow-up for Phone Call Additional follow up Details #3:: Details for Additional Follow-up Action Taken: Patient called while on call. Very worried about not having seizure with klonopin.  I refilled 1 week supply tonight to last until his primary md is able to refill his medicine. Additional Follow-up by: Benn Moulder MD,  January 23, 2008 6:33 PM  Thank you.  I will address at visit with patient. ...................................................................Eustaquio Boyden  MD  January 25, 2008 12:58 PM   Prescriptions: KLONOPIN 2 MG TABS (CLONAZEPAM) Take 2 tablet by mouth twice a day  #28 x 0   Entered by:   Benn Moulder MD   Authorized by:   Eustaquio Boyden  MD   Signed by:   Benn Moulder MD on 01/23/2008   Method used:   Telephoned to ...         RxID:   [id number]

## 2011-01-16 NOTE — Progress Notes (Signed)
Summary: wi request  Phone Note Call from Patient Call back at Home Phone [phone]   Reason for Call: Talk to Nurse Summary of Call: pt is requesting wi appt to f/u from urgent care, he sts they wanted him to be seen asap Initial call taken by: ERIN LEVAN,  June 27, 2007 3:55 PM  Follow-up for Phone Call        seen in urgent care last week for nausea. Had tests done. Attends [employer] during the day. Wants test for diabetes & a nuclear stress test. Has extensive family hx of diabetes. Appt scheduled with his pcp Follow-up by: Golden Circle RN,  June 27, 2007 3:57 PM

## 2011-01-16 NOTE — Letter (Signed)
Summary: Generic Letter  [facility]  [address]   [place], [place] [address]   Phone: [phone]  Fax: [phone]    05/24/2010  [address] [place], [place]  [address]  Dear Mr. SIEMS,   Hopefully this letter finds you well.  We have not seen you in clinic since 01/2010.  You will be due in August for a biannual visit.  I just wanted to let you know that I will be leaving the practice at the end of this month.  It's been a pleasure taking care you.  Please call us for an appointment in August to meet your new doctor.      Sincerely,   Eustaquio Boyden  MD   Appended Document: Generic Letter letter mailed.

## 2011-01-16 NOTE — Miscellaneous (Signed)
Summary: change of testosterone creme?  Clinical Lists [facility] [phone])  called to report that they are having a difficult time getting the testosterone cypionate? wants to know if he can substitute anathanate salt. states it is bioequivilent & they can get it. message to pcp.Golden Circle RN  June 22, 2009 12:31 PM   called pharmacy and said ok. Eustaquio Boyden  MD  June 22, 2009 2:05 PM

## 2011-01-16 NOTE — Consult Note (Signed)
SummaryDeboraha Sprang physicians - rpt colonoscopy sooner  [facility]   Imported By: Haydee Salter 01/31/2009 10:53:08  _____________________________________________________________________  External Attachment:    Type:   Image     Comment:   External Document

## 2011-01-16 NOTE — Assessment & Plan Note (Signed)
Summary: flu shot wp  Nurse Visit Flu Vaccine Consent Questions     Do you have a history of severe allergic reactions to this vaccine? no    Any prior history of allergic reactions to egg and/or gelatin? no    Do you have a sensitivity to the preservative Thimersol? no    Do you have a past history of Guillan-Barre Syndrome? no    Do you currently have an acute febrile illness? no    Have you ever had a severe reaction to latex? no    Vaccine information given and explained to patient? yes    Are you currently pregnant? no  Lot Number:[id number] Site Given R  Deltoid   Vital Signs:  Patient Profile:   55 Years Old Male Temp:     98.8 degrees F                 Prior Medications: KLONOPIN 2 MG TABS (CLONAZEPAM) Take 2 tablet by mouth twice a day TOPROL XL 200 MG TB24 (METOPROLOL SUCCINATE) Take 1 tablet by mouth once a day ZOCOR 40 MG TABS (SIMVASTATIN) Take 1 tablet by mouth at bedtime Current Allergies: ! SULFA    Orders Added: 1)  Flu Vaccine & Administration Baden.Dew    ]

## 2011-01-16 NOTE — Letter (Signed)
Summary: [facility]  Pt has been scheduled for an appt on 06/29/2008 @ 3:00PM with Dr. Anne Hahn.  Hardcopy in MD box.  Please call pt and let him know.  Thank you. ................................................................[name] Boyden  MD  Apr 22, 2008 3:55 PM   Pt informed about appt via telephone.  Asked that we mail him a reminder since he is out driving and canlt write down appt time.  Appt reminder mailed................................................................ Madison Albea CMA  Apr 22, 2008 4:15 PM

## 2011-01-16 NOTE — Assessment & Plan Note (Signed)
Summary: hematuria/ts   Vital Signs:  Patient profile:   55 year old male Weight:      186 pounds Pulse rate:   76 / minute BP sitting:   120 / 73  (right arm)  Vitals Entered By: Arlyss Repress CMA, (July 18, 2010 2:32 PM) CC: hematuria Is Patient Diabetic? No Pain Assessment Patient in pain? no        Primary Care Provider:  Clementeen Graham MD  CC:  hematuria.  History of Present Illness: Pleease see other OV note.  There were two printed for same appt.   Allergies: 1)  ! Sulfa   Complete Medication List: 1)  Klonopin 2 Mg Tabs (Clonazepam) .... Take 2 and a half tablets by mouth twice a day 2)  Toprol Xl 200 Mg Tb24 (Metoprolol succinate) .Marland Kitchen.. 1 by mouth daily 3)  Zocor 40 Mg Tabs (Simvastatin) .... Take 1 tablet by mouth at bedtime 4)  Depo-testosterone 200 Mg/ml Oil (Testosterone cypionate) .... Inject 400 mg im every 2 weeks 5)  Prilosec Otc 20 Mg Tbec (Omeprazole magnesium) .... One daily 6)  Seroquel 300 Mg Tabs (Quetiapine fumarate) .... Take three by mouth daily 7)  Doxepin Hcl 100 Mg Caps (Doxepin hcl) .... Take nine by mouth daily 8)  Dilantin Infatabs 50 Mg Chew (Phenytoin) .... Take four by mouth once daily 9)  Oscal 500/200 D-3 500-200 Mg-unit Tabs (Calcium-vitamin d) .... Take one daily 10)  Docusate Sodium 100 Mg Caps (Docusate sodium) .... Take one by mouth daily 11)  Miralax Powd (Polyethylene glycol 3350) .Marland KitchenMarland Kitchen. 17 gm in 8 oz fluid qdaily as needed constipation 12)  Monoject Syringe 23g X 1" 3 Ml Misc (Syringe/needle (disp)) .... Use as directed.  qs 1 month  Other Orders: Urinalysis-[facility] (00000) Future Orders: CBC-[facility] (41660) ... 08/25/2010 Basic Met-[facility] ([id number]) ... 07/20/2011

## 2011-01-16 NOTE — Miscellaneous (Signed)
Summary: med refill  Clinical Lists Changes he is very displeased that we did not tell him he has to come in for a visit to get Klonipin refill. told him we do not call him. he is to call the pharmacy 3 biz days in advance. the pharmacy sends an electronic message to md & he responds via e rx. when he, the pt, calls the pharmacy he will get the message that drug has been denied & then he is to call here. we will then make him an appt. he is still not pleased. he as a few left & states he will go into status elipticus without this. told him he can go to [facility] over the wknd & they will give him enough to make it to his next appt. I place in Monday with S. Saxon as he is insistant he cannot wait until md is in clinic to get his meds. Marland KitchenGolden Circle RN  August 25, 2010 11:35 AM   he called again. still upset that we did not call him about not filling his Klonipin. I went over the above & that we had a plan so he would not be without his med.Golden Circle RN  August 25, 2010 11:53 AM  Pt had an oportunity to meet the physician on 08/01/10 to discuss his medications and refil his Klonipin Rx. He did not show up for that appointment.  Clementeen Graham, MD 08/28/10 8:45am

## 2011-01-16 NOTE — Progress Notes (Signed)
Summary: Reschedule appt  Phone Note Call from Patient   Reason for Call: Talk to Nurse Summary of Call: pt is requesting we reschedule his appt at the neurology office, sts he had an emergency and couldn't make it. Initial call taken by: Knox Royalty,  June 30, 2008 4:56 PM  Follow-up for Phone Call        left message that he should call that doctor & reschedule Follow-up by: Golden Circle RN,  July 01, 2008 9:50 AM         Appended Document: Reschedule appt Pt is wanting [place] to schedule another neurology appt for him, but at a different practice. He states he doesn't feel comfortable going to the office we originially referred him to since he missed his appt, he can be reached at [phone].  Appended Document: Reschedule appt left message that there was no other neuro office in [place] so he needed to reschedule the appt he missed

## 2011-01-16 NOTE — Assessment & Plan Note (Signed)
Summary: fu urologist   Vital Signs:  Patient Profile:   55 Years Old Male Height:     70 inches Weight:      189.4 pounds BMI:     27.27 Temp:     98.4 degrees F oral Pulse rate:   79 / minute BP sitting:   109 / 71  (left arm) Cuff size:   regular  Pt. in pain?   no  Vitals Entered By: Garen Grams LPN (September 08, 2008 4:32 PM)                  PCP:  Eustaquio Boyden  MD  Chief Complaint:  f/u visit.  History of Present Illness: CC: f/u and discuss urology visit.  1. Urology - saw urologist last week, found PSA to be elevated for his age but normal range.  Pt decided to go down on his testosterone dose to 200mg  Q2wks IM instead of previous 400mg  Q2wks.  2. Neurology - pt states he saw neurologist within the last month who per patient told him to increase Dilantin dose to 200mg  once daily and klonopin 2mg  2 1/2 tablets BID (both of which are what he was taking per my records).  Pt states he has been doing well with his mood and these two meds along with Doxepin 900mg  once daily and Seroquel 300 two times a day.  no records from neuro yet.  3. GERD - states doesn't usually take omeprazole - instead takes 4-5 tums when he gets reflux.  States content with this.  4. Bone health - h/o osteopenia, needs rpt bone scan 2011 - on calcium and vit D, but states doesn't know doses and sometimes forgets to take.  was worried about vit D deficiency so neurologist checked level and found somewhat low.  5. HLD - on simvastatin 40 daily  6. HTN - stable on meds.  7. overweight - BMI 27.3.  discussed this today.  pt reports trying to eat healthy, not exercising.      Current Allergies: ! SULFA  Past Medical History:    Reviewed history from 06/24/2008 and no changes required:       - Depression  (see psych 4x/yr, sees therapist 6x/yr)       - Schizoaffective diorder/ Schizophrenia        -ADMISSIONS 2 TO PSYCHE issues x 2 :                    1- on 09/05  depression with suicidal thoughts and plans                                                                        2-  ~ 1990 for self orchiectomy/suicidal                                                                        both admissions at [facility], [place], [place]       -  Hx of paradoxical reactions to SSRI's       - hypogonadism - SELF ORCHIECTOMY  while psychotic yr ( on testosterone IM q 2 wks)       -Osteopenia  2 to hypogonadism and dilantin (REPEAT BONE DENSITY ON  06/2010)       -Hyperlipidemia       -Hypertension       -Seizures disorder ( REACHS EASILY  TOXIC LEVELS  of dilantin , needs dilantin levels f/u . Seems that alternating 150 mg / 200mg  works well 07/08)       - Rhinitis, Allergic - 477.9       - Hx of bilalteral R > L  De Quervain's tenosynovitis        -  Hx of  L ankle sprain        - creatinine baseline:1.1/1.2      Physical Exam  General:     Well-developed,well-nourished,in no acute distress; alert,appropriate and cooperative throughout examination Lungs:     No crackles or wheezes, CTAB Heart:     Normal rate and regular rhythm. S1 and S2 normal without gallop, murmur, click, rub or other extra sounds. Abdomen:     Bowel sounds positive,abdomen soft and non-tender without masses, organomegaly or hernias noted. Skin:     Intact without suspicious lesions or rashes    Impression & Recommendations:  Problem # 1:  HYPERLIPIDEMIA (ICD-272.4) Assessment: Unchanged continue statin, will need FLP 01/2009. His updated medication list for this problem includes:    Zocor 40 Mg Tabs (Simvastatin) .Marland Kitchen... Take 1 tablet by mouth at bedtime   Problem # 2:  SEIZURE DISORDER, HX OF (ICD-V12.49) Assessment: Unchanged saw neurology, rec continue as is with klonopin and dilantin.  he will work on getting records for [place]. Orders: [facility]- Est  Level 4 (33295)   Problem # 3:  GERD (ICD-530.81) Assessment: Unchanged advised to  try and take tums daily to see if it would help reflux that way remember also for bone health.  doesn't really take prilosec. His updated medication list for this problem includes:    Prilosec Otc 20 Mg Tbec (Omeprazole magnesium) ..... One daily  Orders: [facility]- Est  Level 4 (18841)   Problem # 4:  OSTEOPENIA (ICD-733.90) Assessment: Unchanged on tums and vitamin D.  unsure amount.  forgets to take at times.  advised to try to exercise more that way would receive more sunshine His updated medication list for this problem includes:    Oscal 500/200 D-3 500-200 Mg-unit Tabs (Calcium-vitamin d) .Marland Kitchen... Take one daily   Problem # 5:  HYPERTENSION NEC (ICD-997.91) Assessment: Unchanged stable on med. Orders: [facility]- Est  Level 4 (99214)   Problem # 6:  HYPOGONADISM, MALE (ICD-257.2) Assessment: Unchanged to decrease dose of testosterone for worry of elevated PSA to 3.5. Orders: [facility]- Est  Level 4 (66063)   Problem # 7:  OVERWEIGHT (ICD-278.02) Assessment: New advised to watch diet, increase exercise.  Pt states he wants to start diet of 1/2 gallon of V8 and slim fast.  advised to instead increase fruits/vegetables, and decrease carbs.  Complete Medication List: 1)  Klonopin 2 Mg Tabs (Clonazepam) .... Take 2 and a half tablets by mouth twice a day 2)  Toprol Xl 200 Mg Tb24 (Metoprolol succinate) .Marland Kitchen.. 1 by mouth daily 3)  Zocor 40 Mg Tabs (Simvastatin) .... Take 1 tablet by mouth at bedtime 4)  Depo-testosterone 200 Mg/ml Oil (Testosterone cypionate) .... Inject 400 mg im every 2 weeks  5)  Prilosec Otc 20 Mg Tbec (Omeprazole magnesium) .... One daily 6)  Seroquel 300 Mg Tabs (Quetiapine fumarate) .... Take two by mouth daily 7)  Doxepin Hcl 100 Mg Caps (Doxepin hcl) .... Take nine by mouth daily 8)  Oscal 500/200 D-3 500-200 Mg-unit Tabs (Calcium-vitamin d) .... Take one daily 9)  Dilantin Infatabs 50 Mg Chew (Phenytoin) .... Take four by mouth once daily   Patient Instructions: 1)   Please return in 2-3 months for follow up. 2)  Good to see you today. 3)  Try to take a tums a day to see if it better controls your reflux. 4)  Work on increasing exercise and watching what you eat - more fruits and vegetables - to better control your weight. 5)  Try to have the neurology office send [place] a record of your latest visit with them. 6)  Call clinic with any questions.   ]  Appended Document: fu urologist    Clinical Lists Changes  Orders: Added new Service order of Influenza Vaccine NON MCR (16109) - Signed Observations: Added new observation of FLU VAX#1VIS: 07/10/07 version given September 10, 2008. (09/10/2008 15:14) Added new observation of FLU VAXLOT: [id number] (09/10/2008 15:14) Added new observation of FLU VAX EXP: 06/15/2009 (09/10/2008 15:14) Added new observation of FLU VAXBY: KATHY DOWLING RN (09/10/2008 15:14) Added new observation of FLU VAXRTE: IM (09/10/2008 15:14) Added new observation of FLU VAX DSE: 0.5 ml (09/10/2008 15:14) Added new observation of FLU VAXMFR: GlaxoSmithKline (09/10/2008 15:14) Added new observation of FLU VAX SITE: left deltoid (09/10/2008 15:14) Added new observation of FLU VAX: Fluvax Non-MCR (09/10/2008 15:14)       Influenza Vaccine    Vaccine Type: Fluvax Non-MCR    Site: left deltoid    Mfr: GlaxoSmithKline    Dose: 0.5 ml    Route: IM    Given by: Rachael Fee RN    Exp. Date: 06/15/2009    Lot #: [id number]    VIS given: 07/10/07 version given September 10, 2008.  Flu Vaccine Consent Questions    Do you have a history of severe allergic reactions to this vaccine? no    Any prior history of allergic reactions to egg and/or gelatin? no    Do you have a sensitivity to the preservative Thimersol? no    Do you have a past history of Guillan-Barre Syndrome? no    Do you currently have an acute febrile illness? no    Have you ever had a severe reaction to latex? no    Vaccine information given and explained to  patient? yes

## 2011-01-16 NOTE — Assessment & Plan Note (Signed)
Summary: med refill/[name]/[name]   Vital Signs:  Patient profile:   55 year old male Height:      70 inches Weight:      176 pounds BMI:     25.34 Pulse rate:   92 / minute BP sitting:   109 / 76  (left arm) Cuff size:   regular  Vitals Entered By: Tessie Fass CMA (August 28, 2010 10:29 AM) CC: Rx refill Is Patient Diabetic? No Pain Assessment Patient in pain? no        Primary Care Provider:  Clementeen Graham MD  CC:  Rx refill.  History of Present Illness: Patient is here for a refill on his clonazepam, he reports being on this for years to control seizures.  He is very upset that it was not refilled.  I discussed the importance of the physican who is responsible for this need to see him, this was made clear.  He did have an apt and no showed.  He did understand this and will make an apt in the next month.  He denies any symptoms of dilantin toxicity, he took it this am so was not able to get a level.  Describes a chronic cough, mostly when lying down for months.  He lays down most of the time, even after eating.  He denies ever smoking, has no allergies known, he has not had a recent UTI.  He denies heartburn or GERD.    Left ear is itchy and drains some, occasionally tender.  Habits & Providers  Alcohol-Tobacco-Diet     Tobacco Status: never  Current Medications (verified): 1)  Klonopin 2 Mg Tabs (Clonazepam) .... Take 2 and A Half Tablets By Mouth Twice A Day 2)  Toprol Xl 200 Mg  Tb24 (Metoprolol Succinate) .Marland Kitchen.. 1 By Mouth Daily 3)  Zocor 40 Mg Tabs (Simvastatin) .... Take 1 Tablet By Mouth At Bedtime 4)  Depo-Testosterone 200 Mg/ml  Oil (Testosterone Cypionate) .... Inject 400 Mg Im Every 2 Weeks 5)  Prilosec Otc 20 Mg  Tbec (Omeprazole Magnesium) .... One Daily 6)  Seroquel 300 Mg  Tabs (Quetiapine Fumarate) .... Take Three By Mouth Daily 7)  Doxepin Hcl 100 Mg  Caps (Doxepin Hcl) .... Take Nine By Mouth Daily 8)  Dilantin Infatabs 50 Mg  Chew (Phenytoin) .... Take  Four By Mouth Once Daily 9)  Oscal 500/200 D-3 500-200 Mg-Unit  Tabs (Calcium-Vitamin D) .... Take One Daily 10)  Docusate Sodium 100 Mg Caps (Docusate Sodium) .... Take One By Mouth Daily 11)  Miralax  Powd (Polyethylene Glycol 3350) .Marland KitchenMarland KitchenMarland Kitchen 17 Gm in 8 Oz Fluid Qdaily As Needed Constipation 12)  Monoject Syringe 23g X 1" 3 Ml Misc (Syringe/needle (Disp)) .... Use As Directed.  Qs 1 Month 13)  Cortisporin 3.5-10000-1 Soln (Neomycin-Polymyxin-Hc) .... 3 Drops in Right Ear Three Times A Day For 7 Days  Allergies (verified): 1)  ! Sulfa  Review of Systems      See HPI  Physical Exam  General:  Pale, depressed appearing Eyes:  EOMS full, few beats of physiologic nystagmus in extreme gaze Ears:  Right external canal with yellow exudate, TM was retracted.  Left TM retracted. Nose:  non inflammed Mouth:  no obvious post nasal drainage Lungs:  normal respiratory effort and normal breath sounds.     Impression & Recommendations:  Problem # 1:  SEIZURE DISORDER, HX OF (ICD-V12.49)  Refilled clonazepam for 31 days, must see primary MD for ongoing refills.  Patients is dependent on  this dose and has been taking this for many years.  Instructed not to take dilantin prior to next visit so that we can get levels.  Has a history of toxicity but deneis ataxia or diploplia.  Orders: [facility]- Est  Level 4 (16109)  Problem # 2:  OTITIS EXTERNA (ICD-380.10)  His updated medication list for this problem includes:    Cortisporin 3.5-10000-1 Soln (Neomycin-polymyxin-hc) .Marland KitchenMarland KitchenMarland KitchenMarland Kitchen 3 drops in right ear three times a day for 7 days  Orders: [facility]- Est  Level 4 (60454)  Problem # 3:  COUGH (ICD-786.2)  Patient did not want to try a course of nasal steroids.  He has a previous diagnosis of GERD.  Instructed not to lay down for 1 hour after eating.  He wanted reasurance that there was nothing wrong with his lungs.  Orders: [facility]- Est  Level 4 (99214)  Complete Medication List: 1)  Klonopin 2 Mg Tabs (Clonazepam)  .... Take 2 and a half tablets by mouth twice a day 2)  Toprol Xl 200 Mg Tb24 (Metoprolol succinate) .Marland Kitchen.. 1 by mouth daily 3)  Zocor 40 Mg Tabs (Simvastatin) .... Take 1 tablet by mouth at bedtime 4)  Depo-testosterone 200 Mg/ml Oil (Testosterone cypionate) .... Inject 400 mg im every 2 weeks 5)  Prilosec Otc 20 Mg Tbec (Omeprazole magnesium) .... One daily 6)  Seroquel 300 Mg Tabs (Quetiapine fumarate) .... Take three by mouth daily 7)  Doxepin Hcl 100 Mg Caps (Doxepin hcl) .... Take nine by mouth daily 8)  Dilantin Infatabs 50 Mg Chew (Phenytoin) .... Take four by mouth once daily 9)  Oscal 500/200 D-3 500-200 Mg-unit Tabs (Calcium-vitamin d) .... Take one daily 10)  Docusate Sodium 100 Mg Caps (Docusate sodium) .... Take one by mouth daily 11)  Miralax Powd (Polyethylene glycol 3350) .Marland KitchenMarland Kitchen. 17 gm in 8 oz fluid qdaily as needed constipation 12)  Monoject Syringe 23g X 1" 3 Ml Misc (Syringe/needle (disp)) .... Use as directed.  qs 1 month 13)  Cortisporin 3.5-10000-1 Soln (Neomycin-polymyxin-hc) .... 3 drops in right ear three times a day for 7 days  Patient Instructions: 1)  Please make an apt with Dr. Denyse Amass in the next month 2)  Do not take dilantin that morning so we can get level checked Prescriptions: CORTISPORIN 3.5-10000-1 SOLN (NEOMYCIN-POLYMYXIN-HC) 3 drops in right ear three times a day for 7 days  #1 x 0   Entered and Authorized by:   Luretha Murphy NP   Signed by:   Luretha Murphy NP on 08/28/2010   Method used:   Electronically to        [id number] (retail)       [address]       [place], [place]  [address]       Ph: [phone] or [phone]       Fax: [phone]   RxID:   [id number] KLONOPIN 2 MG TABS (CLONAZEPAM) Take 2 and a half tablets by mouth twice a day Brand medically necessary #155 x 0   Entered and Authorized by:   Luretha Murphy NP   Signed by:   Luretha Murphy NP on 08/28/2010   Method used:   Print then Give to Patient   RxID:   [id number]

## 2011-01-16 NOTE — Progress Notes (Signed)
Summary: Rx Req  Phone Note Refill Request Call back at Home Phone [phone] Message from:  Patient  Refills Requested: Medication #1:  KLONOPIN 2 MG TABS Take 2 and a half tablets by mouth twice a day [facility] US[facility] [address]  Initial call taken by: Clydell Hakim,  February 16, 2010 9:17 AM  Follow-up for Phone Call        filled and sent to sally Follow-up by: Eustaquio Boyden  MD,  February 16, 2010 10:00 AM    Prescriptions: Scarlette Calico 2 MG TABS (CLONAZEPAM) Take 2 and a half tablets by mouth twice a day  #152 x 1   Entered and Authorized by:   Eustaquio Boyden  MD   Signed by:   Eustaquio Boyden  MD on 02/16/2010   Method used:   Printed then faxed to ...       [id number] (retail)       [address]       [place], [place]  [address]       Ph: [phone] or [phone]       Fax: [phone]   RxID:   [id number]  [facility] destroyed the original here.Golden Circle RN  February 16, 2010 11:16 AM

## 2011-01-16 NOTE — Progress Notes (Signed)
Summary: refill  Phone Note Refill Request Call back at Home Phone [phone] Message from:  Patient  Refills Requested: Medication #1:  ZOCOR 40 MG TABS Take 1 tablet by mouth at bedtime  Medication #2:  DILANTIN INFATABS 50 MG  CHEW take four by mouth once daily  Medication #3:  KLONOPIN 2 MG TABS Take 2 and a half tablets by mouth twice a day not sure if there is anything else he needs refilled [facility] [address]  Initial call taken by: De Nurse,  September 19, 2009 11:02 AM  Follow-up for Phone Call        to pcp Follow-up by: Golden Circle RN,  September 19, 2009 11:09 AM  Additional Follow-up for Phone Call Additional follow up Details #1::        done.  placed klonopin script in to fax bin. Additional Follow-up by: Eustaquio Boyden  MD,  September 19, 2009 11:42 AM    Prescriptions: Scarlette Calico 2 MG TABS (CLONAZEPAM) Take 2 and a half tablets by mouth twice a day  #152 x 0   Entered and Authorized by:   Eustaquio Boyden  MD   Signed by:   Eustaquio Boyden  MD on 09/19/2009   Method used:   Printed then faxed to ...       [id number] (retail)       [address]       [place], [place]  [address]       Ph: [phone] or [phone]       Fax: [phone]   RxID:   [id number] DILANTIN INFATABS 50 MG  CHEW (PHENYTOIN) take four by mouth once daily  #120 x 3   Entered and Authorized by:   Eustaquio Boyden  MD   Signed by:   Eustaquio Boyden  MD on 09/19/2009   Method used:   Electronically to        [id number] (retail)       [address]       [place], [place]  [address]       Ph: [phone] or [phone]       Fax: [phone]   RxID:   [id number] ZOCOR 40 MG TABS (SIMVASTATIN) Take 1 tablet by mouth at bedtime  #90 x 3   Entered and Authorized by:   Eustaquio Boyden  MD   Signed by:   Eustaquio Boyden  MD on 09/19/2009   Method used:   Electronically to        [id number] ([facility]ail[facility]       [address]       [place], [place]   [address]       Ph: [phone] or [phone]       Fax: [phone]   RxID:   [id number]

## 2011-01-16 NOTE — Miscellaneous (Signed)
Summary: H&P from 12/07/08 obstruction  Clinical Lists Changes  DATE OF ADMISSION:  12/07/2008   DATE OF DISCHARGE:                                 HISTORY & PHYSICAL      PRIORITY ADMISSION HISTORY AND PHYSICAL      PRIMARY CARE PHYSICIAN:  Viviann Spare A. Waynette Buttery, MD, [facility].  Patient is assigned to [place].      CHIEF COMPLAINT:  Constipation for 1 week, abdominal pain for 1 day.      HISTORY OF PRESENT ILLNESS:  This is a 55 year old male. For past   medical history, see below.  According to patient, he has had quite   severe constipation for the past 7 days, has had difficulty in passing   stools and when he does manage to, these are very small bowel movements.   He took an OTC laxative in the a.m. of December 07, 2008, and following   this about 5 hours later, he had generalized crampy abdominal pains,   which have since increased in both severity and frequency, no bowel   movement, no vomiting, no pyrexia, no chest pain or shortness of breath.   He had, however, passed a rather large bowel movement in the emergency   department by the time of this evaluation.      PAST MEDICAL HISTORY:   1. Hypertension.   2. Seizure disorder, last seizure was approximately 3 months ago.   3. Depression, anxiety/panic disorder.   4. History of gastritis, April 2005, confirmed by EGD. H. pylori       negative.   5. Hyperplastic polyps on colonoscopy, April 2005 (gastroenterologist,       Dr. Loreta Ave).   6. Status post gum trimming for Dilantin-induced gingival hyperplasia       in the past.   7. Status post self-inflicted bilateral castration in the remote past.       He required surgery at the time, to control bleeding.   8. Dyslipidemia.      MEDICATION HISTORY:   1. Dilantin 200 mg p.o. daily.   2. Klonopin ( 2 mg) 2 pills q.a.m. and 3 pills q.h.s.   3. Simvastatin (query dosage), 1 p.o. q.h.s.   4. Testosterone cypionate 1 injection IM q.2 weekly (last injection       was approximately 2 weeks ago).   5. Toprol-XL (query dosage), 1 p.o. daily.   6. Seroquel (300 mg) 1 p.o. q.a.m. and 2 pills p.o. q.h.s.      ALLERGIES:  SULFA.      SYSTEMS REVIEW:  As in patient history and chief complaint.  Denies   vomiting, denies chest pain or shortness of breath, denies pyrexia,   denies chills.      SOCIAL HISTORY:  The patient has been on disability since 22.  He is   educated, and has gone through graduate school.  Single, no offspring,   nonsmoker, nondrinker, no history of drug abuse.      FAMILY HISTORY:  Positive for diabetes mellitus in mother and 2 maternal   uncles.  His father is age 77 years, is diabetic and is status post CVA.   He had 2 brothers, 1 was hypertensive and died status post infective   complications following hip surgery, and the other is alive, but is   diabetic.  PHYSICAL EXAMINATION:  VITAL SIGNS:  Temperature 98.1, pulse 78 per   minute and regular, respiratory rate 18, BP 151/95 mmHg, rechecked   142/92 mmHg, pulse oximetry 98% on 3 L of oxygen.   GENERAL:  The patient did not appear to be in obvious acute distress at   the time of this evaluation and as a matter of fact had just passed a   rather large bowel movement while in the emergency department, alert,   communicative, and not short of breath at rest.   HEENT:  No clinical pallor, no jaundice, no conjunctival injection,   throat is clear.   NECK:  Supple with JVP not seen, no palpable lymphadenopathy or palpable   goiter.   CHEST:  Clinically clear to auscultation, no wheezes or crackles.   HEART:  Heart sounds 1 and 2 heard, normal, regular, no murmurs.   ABDOMEN:  Full, soft, nontender, nondistended, no palpable organomegaly   is palpable, normal bowel sounds.   LOWER EXTREMITIES:  Minimal pitting edema, palpable peripheral pulses.   MUSCULOSKELETAL:  Exam unremarkable.   CENTRAL NERVOUS SYSTEM:  No focal neurologic deficit on gross   examination.       INVESTIGATION:  CBC:  WBC 12.6, hemoglobin is 16.4, hematocrit 48.9,   platelets 231.  Electrolytes:  Sodium 135, potassium 3.8, chloride 96,   CO2 29, BUN 13, creatinine 1.31, glucose 130, AST 33, ALT 34, alkaline-   phosphatase 89, lipase 60.  Urine drug screen negative.   Abdominal/pelvic CT scan dated December 07, 2008, shows atelectasis at   the lung bases bilaterally.  There was a hypervascular indeterminate   enhancing lesion at the right hepatic lobe.  Pelvic CT showed moderate   dilatation of the colon extending into the mid sigmoid where there is an   area of stricture versus neoplasm.  On prior examination, there was   colitis in this area.      ASSESSMENT AND PLAN:   1. Large bowel obstruction.  This appears somewhat subacute, secondary       to a stricture of the mid sigmoid colon. Etiology currently is       uncertain, and computerized tomography (CT) scan findings suggest       possible inflammatory benign stricture, based on prior computerized       tomography scan, which showed colitis in this area, versus       neoplastic.  We shall admit patient, commence bowel rest, continue       intravenous fluids, and request a Gastroenterology consultation for       a possible colonoscopy and biopsy.  A surgical consultation may be       indicated for definitive management.      1. Seizure disorder.  This appears controlled at present.  Patient's       last seizure was approximately 8 months ago.  We shall continue       Dilantin in pre-admission doses however, intravenously for now, as       patient is NPO.      1. History of anxiety/panic disorder.  The patient's psychotropics       will be on hold, as he is NPO. However, we shall utilize p.r.n.       Ativan.      1. Hypertension.  We shall manage this with a Clonidine patch for now.      Further management will all depend on clinical course.      Note: Patient  subsequently abscounded form the emergency department.                Isidor Holts, M.D.   Electronically Signed

## 2011-01-16 NOTE — Miscellaneous (Signed)
Summary: walk in  Clinical Lists Changes he was here today for appt that was yesterday. rescheduled to tomorrow at 4. states he cannot get up early & be here.needed a copy of the most recent labs for his psychiatrist . gave him a copy.Golden Circle RN  August 02, 2010 3:47 PM

## 2011-01-16 NOTE — Assessment & Plan Note (Signed)
Summary: NUTRI APPT/KH   Vital Signs:  Patient profile:   55 year old male Height:      70 inches Weight:      182.8 pounds BMI:     26.32  Vitals Entered By: Wyona Almas PHD (May 12, 2009 3:07 PM)  Primary Care Provider:  Eustaquio Boyden  MD   History of Present Illness: Assessment:  Pt ws seen for 30 minutes.  [name] thought that his weight might be up considerably (vs. 1 lb) since last appt, since he recently took a trip to [place] w/ a friend, which included quite a lot of fast food.  He has switched to 1% milk consistently, and he has made an effort to incorporate some veg's to his diet, i.e., he has tried cooking bell peppers and also green beans with chopped garlic (from a jar).  Last night he walked about a mile with a friend, but he has not done much other exercise.  He did think it reasonable to ask this friend if he would walk w/ him a couple times a week.    Nutrition Diagnosis: Progress on inadequate energy intake (NI-1.4) related to depression as evidenced by self-report of 3 meals a day usually (sometimes 2 X day).  Limited progress on imbalance of nutrients (NI-5.5) related to lack of veg's and fruits as evidenced by veg's and fruit less than once daily (although more than before).  Inadequate fiber intake (NI-53.5) related to paucity of fruits and veg's as evidenced by chronic constipation.  Physical inactivity (NB-2.1) related to depression as evidenced by pt report of exercise on only one day since appt one month ago.    Monitoring/Evaluation: Per patient.  [name] will monitor weight at home, and will call w/ Qs or to make an appt if wt loss not achieved in next few weeks.    Allergies: 1)  ! Sulfa   Complete Medication List: 1)  Klonopin 2 Mg Tabs (Clonazepam) .... Take 2 and a half tablets by mouth twice a day 2)  Toprol Xl 200 Mg Tb24 (Metoprolol succinate) .Marland Kitchen.. 1 by mouth daily 3)  Zocor 40 Mg Tabs (Simvastatin) .... Take 1 tablet by mouth at  bedtime 4)  Depo-testosterone 200 Mg/ml Oil (Testosterone cypionate) .... Inject 400 mg im every 2 weeks 5)  Prilosec Otc 20 Mg Tbec (Omeprazole magnesium) .... One daily 6)  Seroquel 300 Mg Tabs (Quetiapine fumarate) .... Take two by mouth daily 7)  Doxepin Hcl 100 Mg Caps (Doxepin hcl) .... Take nine by mouth daily 8)  Oscal 500/200 D-3 500-200 Mg-unit Tabs (Calcium-vitamin d) .... Take one daily 9)  Dilantin Infatabs 50 Mg Chew (Phenytoin) .... Take four by mouth once daily 10)  Docusate Sodium 100 Mg Caps (Docusate sodium) .... Take one by mouth daily 11)  Miralax Powd (Polyethylene glycol 3350) .Marland KitchenMarland Kitchen. 17 gm in 8 oz fluid qdaily as needed constipation  Other Orders: Reassessment Each 15 min unit- [facility] ([address])  Patient Instructions: 1)  Continue to add vegetables to your diet.  When stir-frying vegetables using spray oil, add water as needed to keep from sticking/burning.   2)  Ask friend to walk with you 2 X wk.  Set a consistent time for your exercise.   3)  Remember exercise opportunities throughout the day.   4)  Try to eat breakfast within the first hour that you are awake, and spread out the calories through the day.  Never go more than 5 hours without eating.  5)  Call Dr. [name] w/ questions or for a follow-up appt if your weight loss is not going as you'd like:  [id number].

## 2011-01-16 NOTE — Assessment & Plan Note (Signed)
Summary: fu wp   Vital Signs:  Patient Profile:   55 Years Old Male Height:     70 inches Weight:      187 pounds BMI:     26.93 Temp:     98.3 degrees F oral Pulse rate:   79 / minute BP sitting:   125 / 82  (left arm)  Pt. in pain?   yes    Location:   left shoulder    Intensity:   10    Type:       sharp with movement  Vitals Entered By: Dedra Skeens CMA, (July 29, 2008 4:21 PM)                  PCP:  Eustaquio Boyden  MD  Chief Complaint:  f/u.  History of Present Illness: CC: right shoulder pain  Pt presents 30+ min late for appt so will only discuss one issue  R shoulder pain since fall mid may (after seizure), initially conservatively treated with recommendation for NSAIDs, rest, colda nd warm compresses (pt states he did not use NSAIDs nor cold/warm compresses.)  Pt reports no improvement in pain.  Also given exercises at last visit per patient request to "prevent frozen shoulder", states they have not helped much.  Pt states pain present only when he tries to lift cat from a laying down position.      Current Allergies: ! SULFA      Physical Exam  Msk:     negative impingement sign, negative empty can sign, full passive range of motion, weakness 2/2 pain when internally rotating and adducting shoulder, pain localizes to lateral arm, around site of deltoid insertion    Impression & Recommendations:  Problem # 1:  SHOULDER PAIN, RIGHT (ICD-719.41) Assessment: Unchanged Unchanged.  Has been going on for 3 months, pt denies improvement with exercising and rest. Only present with certain positions/exertion.  Pt hesitant to try trial of NSAIDs as states this will "only treat symptoms".  No evidence of impingement or adhesive bursitis.  Possible damage to biceps tendon?  Will attain shoulder Xray to further eval, r/o lytic lesion although no point tenderness.  Orders: Diagnostic X-Ray/Fluoroscopy (Diagnostic X-Ray/Flu) [facility]- Est Level  3  (27253)   Complete Medication List: 1)  Klonopin 2 Mg Tabs (Clonazepam) .... Take 2 and a half tablets by mouth twice a day 2)  Toprol Xl 200 Mg Tb24 (Metoprolol succinate) .Marland Kitchen.. 1 by mouth daily 3)  Zocor 40 Mg Tabs (Simvastatin) .... Take 1 tablet by mouth at bedtime 4)  Depo-testosterone 200 Mg/ml Oil (Testosterone cypionate) .... Inject 400 mg im every 2 weeks 5)  Prilosec Otc 20 Mg Tbec (Omeprazole magnesium) .... One daily 6)  Seroquel 300 Mg Tabs (Quetiapine fumarate) .... Take two by mouth daily 7)  Doxepin Hcl 100 Mg Caps (Doxepin hcl) .... Take nine by mouth daily 8)  Oscal 500/200 D-3 500-200 Mg-unit Tabs (Calcium-vitamin d) .... Take one daily 9)  Dilantin Infatabs 50 Mg Chew (Phenytoin) .... Take four by mouth once daily   Patient Instructions: 1)  Please return within 1-2 months for follow up of other chronic issues. 2)  I have sent you to get an xray of your shoulder.  We will see what this shows. 3)  I'd again recommend either extra strength tylenol (every 6 hours as needed for pain) or ibuprofen 600mg  every six hours to help the pain over the next couple of weeks. 4)  Also try warm compresses  to your shoulder. 5)  Depending on the x ray results, we may get physicaltherapy involved. 6)  Hope you ahve a good day.   ]

## 2011-01-16 NOTE — Progress Notes (Signed)
Summary: Rx Req  Phone Note Refill Request Call back at Home Phone [phone] Message from:  Patient  Refills Requested: Medication #1:  DILANTIN INFATABS 50 MG  CHEW take four by mouth once daily  Medication #2:  SEROQUEL 300 MG  TABS take two by mouth daily  Medication #3:  KLONOPIN 2 MG TABS Take 2 and a half tablets by mouth twice a day  Medication #4:  TOPROL XL 200 MG  TB24 1 by mouth daily simvasatin, testosterone, prilosec,  PT [facility]  Initial call taken by: Clydell Hakim,  June 16, 2009 10:42 AM  Follow-up for Phone Call        Please call and inform:  I've refilled several, have not refilled Seroquel as I have not been prescribing that (Dr. Ilsa Iha does).  Please pass and pick up testosterone and klonopin prescriptions.  Please make appointment with me for f/u. Follow-up by: Eustaquio Boyden  MD,  June 16, 2009 4:09 PM  Additional Follow-up for Phone Call Additional follow up Details #1::        Called in Klonopin and Testosterone- pt notified rx are ready for pick up at pharmacy. Additional Follow-up by: Jacki Cones RN,  June 16, 2009 4:29 PM      Prescriptions: DEPO-TESTOSTERONE 200 MG/ML  OIL (TESTOSTERONE CYPIONATE) inject 400 mg IM every 2 weeks  #4 x 3   Entered and Authorized by:   Eustaquio Boyden  MD   Signed by:   Eustaquio Boyden  MD on 06/16/2009   Method used:   Print then Give to Patient   RxID:   [id number] KLONOPIN 2 MG TABS (CLONAZEPAM) Take 2 and a half tablets by mouth twice a day  #152 x 0   Entered and Authorized by:   Eustaquio Boyden  MD   Signed by:   Eustaquio Boyden  MD on 06/16/2009   Method used:   Print then Give to Patient   RxID:   [id number] PRILOSEC OTC 20 MG  TBEC (OMEPRAZOLE MAGNESIUM) one daily  #31 x 5   Entered and Authorized by:   Eustaquio Boyden  MD   Signed by:   Eustaquio Boyden  MD on 06/16/2009   Method used:   Electronically to        [id number]     [address]       [place], [place]  [address]       Ph: [phone] or [phone]       Fax: [phone]   RxID:   [id number] DILANTIN INFATABS 50 MG  CHEW (PHENYTOIN) take four by mouth once daily  #120 x 5   Entered and Authorized by:   Eustaquio Boyden  MD   Signed by:   Eustaquio Boyden  MD on 06/16/2009   Method used:   Electronically to        [id number] (retail)       [address]       [place], [place]  [address]       Ph: [phone] or [phone]       Fax: [phone]   RxID:   [id number] ZOCOR 40 MG TABS (SIMVASTATIN) Take 1 tablet by mouth at bedtime  #30 x 5   Entered and Authorized by:   Eustaquio Boyden  MD   Signed by:   Eustaquio Boyden  MD on 06/16/2009   Method used:   Electronically to  [id number] (retail)       [address]       [name], [place]  [address]       Ph: [phone] or [phone]       Fax: [phone]   RxID:   [id number] TOPROL XL 200 MG  TB24 (METOPROLOL SUCCINATE) 1 by mouth daily  #30 x 5   Entered and Authorized by:   Eustaquio Boyden  MD   Signed by:   Eustaquio Boyden  MD on 06/16/2009   Method used:   Electronically to        [id number] ([facility]ail)       [address]       [place], [place]  [address]       Ph: [phone] or [phone]       Fax: [phone]   RxID:   [id number] DILANTIN INFATABS 50 MG  CHEW (PHENYTOIN) take four by mouth once daily  #120 x 5   Entered and Authorized by:   Eustaquio Boyden  MD   Signed by:   Eustaquio Boyden  MD on 06/16/2009   Method used:   [facility]ically to        [facility] [address] [phone][facility] (retail)       [address]       [place], [place]  [address]       Ph: [phone] or [phone]       Fax: [phone]   RxID:   [id number] ZOCOR 40 MG TABS (SIMVASTATIN) Take 1 tablet by mouth at bedtime  #30 x 5   Entered and Authorized by:   Eustaquio Boyden  MD   Signed by:   Eustaquio Boyden  MD on 06/16/2009    Method used:   Electronically to        [facility] [address] [phone][facility] (retail)       [address]       [place], [place]  [address]       Ph: [phone] or [phone]       Fax: [phone]   RxID:   [id number] TOPROL XL 200 MG  TB24 (METOPROLOL SUCCINATE) 1 by mouth daily  #30 x 5   Entered and Authorized by:   Eustaquio Boyden  MD   Signed by:   Eustaquio Boyden  MD on 06/16/2009   Method used:   Electronically to        [facility] [address] [phone]* (retail)       [address]       [place], [place]  [address]       Ph: [phone] or [phone]       Fax: [phone]   RxID:   [id number]

## 2011-01-16 NOTE — Assessment & Plan Note (Signed)
Summary: ED f/u for UTI   Vital Signs:  Patient profile:   55 year old male Height:      70 inches Weight:      180.8 pounds Temp:     98.4 degrees F oral Pulse rate:   80 / minute BP sitting:   93 / 66  (left arm)  Vitals Entered By: Alphia Kava (Apr 22, 2009 3:36 PM)  CC: ED f/u for UTI Is Patient Diabetic? No   History of Present Illness: 55yo M here for f/u of UTI.  UTI: He was seen in the ED on 5/3 for AMS and found to have a UTI.  Head CT and other w/u was negative.  Cr 1.2.  UA conveyed nit pos, LE mod, and hgb neg.  Pt has a hx of UTI and has seen urology.  Pt admitted to giving himself enemas through the urethra for "an orgasm".  Has done it several times.  He is currently being treated with Macrobid.    Habits & Providers     Tobacco Status: never  Allergies: 1)  ! Sulfa  Review of Systems  The patient denies fever and abdominal pain.         no N/V or flank pain  Physical Exam  General:  Well appearing, NAD Abdomen:  soft, NT, ND Prostate:  no gland enlargement and no nodules.   Psych:  strange demeanor Oriented X3.     Impression & Recommendations:  Problem # 1:  UTI (ICD-599.0) Assessment New Due to self enemas via urethra.  Advised to stop doing this as this is likely the cause.  Cont with the Macrobid.  Return if he has any dysuria, hematuria, or N/V and fever.   Orders: [facility]- Est Level  3 ([id number])  Complete Medication List: 1)  Klonopin 2 Mg Tabs (Clonazepam) .... Take 2 and a half tablets by mouth twice a day 2)  Toprol Xl 200 Mg Tb24 (Metoprolol succinate) .Marland Kitchen.. 1 by mouth daily 3)  Zocor 40 Mg Tabs (Simvastatin) .... Take 1 tablet by mouth at bedtime 4)  Depo-testosterone 200 Mg/ml Oil (Testosterone cypionate) .... Inject 400 mg im every 2 weeks 5)  Prilosec Otc 20 Mg Tbec (Omeprazole magnesium) .... One daily 6)  Seroquel 300 Mg Tabs (Quetiapine fumarate) .... Take two by mouth daily 7)  Doxepin Hcl 100 Mg Caps (Doxepin hcl) .... Take nine  by mouth daily 8)  Oscal 500/200 D-3 500-200 Mg-unit Tabs (Calcium-vitamin d) .... Take one daily 9)  Dilantin Infatabs 50 Mg Chew (Phenytoin) .... Take four by mouth once daily 10)  Docusate Sodium 100 Mg Caps (Docusate sodium) .... Take one by mouth daily 11)  Miralax Powd (Polyethylene glycol 3350) .Marland KitchenMarland Kitchen. 17 gm in 8 oz fluid qdaily as needed constipation  Patient Instructions: 1)  Please schedule an appointment with your primary doctor if your symptoms return .  2)  Continue with the antibiotics.

## 2011-01-16 NOTE — Progress Notes (Signed)
Summary: status of rx   Phone Note Call from Patient Call back at Home Phone [phone]   Reason for Call: Talk to Nurse Summary of Call: pt is checking the status of his rxs, pharmacy has already faxed, pt [facility] Initial call taken by: ERIN LEVAN,  July 16, 2007 9:46 AM  Follow-up for Phone Call        toporol, simvastatin and klonopin also need to be filled.  He was informed that the others were sent to the pharmacy Follow-up by: [facility] CMA,  July 16, 2007 10:09 AM  Additional Follow-up for Phone Call Additional follow up Details #1::        spoke with pharmacist at [facility] and she states the only updated refill pt needs is for clonazepam. fax has been received and is in doctor's box. pt notified that he has refills on all other meds. call back if any problem. Additional Follow-up by: Theresia Lo RN,  July 16, 2007 10:17 AM        Appended Document: status of rx Done [name]

## 2011-01-16 NOTE — Assessment & Plan Note (Signed)
Summary: F/U/KH   Vital Signs:  Patient profile:   55 year old male Height:      70 inches Weight:      186.1 pounds BMI:     26.80 Temp:     98.1 degrees F oral Pulse rate:   79 / minute BP sitting:   107 / 72  (left arm) Cuff size:   regular  Vitals Entered By: Garen Grams LPN (September 20, 2010 2:15 PM) CC: f/u meds Is Patient Diabetic? No Pain Assessment Patient in pain? no        Primary Care Provider:  Clementeen Graham MD  CC:  f/u meds.  History of Present Illness: [name] presents to clinic today to folllow up his medications:  Seizure Meds: Taked dilantin and klonipin at a stable dose for years. Was unable to fill medication because I did not know Mr Dicioccio and cannot fill without a 1st visit.  Took dilantin last at 0400.    HLD: taking medications as directed. No chest pain or dyspnea. Feels well.   Psych: Stable. Mood OK. Denies any SI/HI. Sees psychiatrist weekly.  Habits & Providers  Alcohol-Tobacco-Diet     Tobacco Status: never  Current Medications (verified): 1)  Klonopin 2 Mg Tabs (Clonazepam) .... Take 2 and A Half Tablets By Mouth Twice A Day. 2)  Toprol Xl 200 Mg  Tb24 (Metoprolol Succinate) .Marland Kitchen.. 1 By Mouth Daily 3)  Zocor 40 Mg Tabs (Simvastatin) .... Take 1 Tablet By Mouth At Bedtime 4)  Depo-Testosterone 200 Mg/ml  Oil (Testosterone Cypionate) .... Inject 400 Mg Im Every 2 Weeks 5)  Prilosec Otc 20 Mg  Tbec (Omeprazole Magnesium) .... One Daily 6)  Seroquel 300 Mg  Tabs (Quetiapine Fumarate) .... Take Three By Mouth Daily 7)  Doxepin Hcl 100 Mg  Caps (Doxepin Hcl) .... Take Nine By Mouth Daily 8)  Dilantin Infatabs 50 Mg  Chew (Phenytoin) .... Take Four By Mouth Once Daily 9)  Oscal 500/200 D-3 500-200 Mg-Unit  Tabs (Calcium-Vitamin D) .... Take One Daily 10)  Docusate Sodium 100 Mg Caps (Docusate Sodium) .... Take One By Mouth Daily 11)  Miralax  Powd (Polyethylene Glycol 3350) .Marland KitchenMarland KitchenMarland Kitchen 17 Gm in 8 Oz Fluid Qdaily As Needed Constipation 12)   Monoject Syringe 23g X 1" 3 Ml Misc (Syringe/needle (Disp)) .... Use As Directed.  Qs 1 Month 13)  Cortisporin 3.5-10000-1 Soln (Neomycin-Polymyxin-Hc) .... 3 Drops in Right Ear Three Times A Day For 7 Days  Allergies (verified): 1)  ! Sulfa  Past History:  Past Medical History: Last updated: 05/24/2010 - Abd pain ER visit 12/07/08 - obstruction but pt left AMA with concern he would not receive depression/seizure meds correctly.  CT scan showing colon stricture/neoplasm and liver lesion, however thought to just be edema by GI and colonoscopy at f/u (2010) showed adenomatous polyp resected (rpt 5 yrs) - Depression  (see psych 4x/yr, sees therapist 6x/yr) - Schizoaffective diorder/ Schizophrenia  -ADMISSIONS to inpt psych: 1- on 09/05  depression with suicidal thoughts and plans 2-  ~ 1990 for self orchiectomy/suicidal (both admissions at [facility], [place], [place]) - Hx of paradoxical reaction to [employer] - hypogonadism - SELF ORCHIECTOMY  while psychotic (on testosterone IM q 2 wks) - Osteopenia (T -1.5 2007) 2/2 hypogonadism and dilantin - Hyperlipidemia - Hypertension - Seizures disorder (REACHES EASILY TOXIC LEVELS  of dilantin , needs dilantin levels f/u.) Seems that alternating 150 mg / 200mg  works well 07/08 - Rhinitis, Allergic - 477.9 -  Hx of bilalteral R > L  De Quervain's tenosynovitis  - Hx of  L ankle sprain  - creatinine baseline:1.1/1.2  Past Surgical History: Last updated: 09/08/2009 Bone Density T:- 1.6 - 01/31/2006; T -1.6 08/2009.  consider rpt 2012 lipid panel 2/08: TC=138, TG=91, HDL=55, LDL=65 sigmoidoscopy (nl) - 03/18/1999 colonoscopy 2010 - adenomatous polyp resected (rpt 2015)  Family History: Last updated: 12/30/2008 -DM 2 -Oteoporosis (parents and brother) - Father had CVA 11/2008  Social History: Last updated: 09/20/2010 Unemployed. Disability. Single. No children.  broke up with girlfriend (10/08) and this caused him much anxiety as he was afraid  she was out to get him.  Feeling better from this now.   denies smoking, EtOH, rec drugs.  Risk Factors: Smoking Status: never (09/20/2010)  Family History: Reviewed history from 12/30/2008 and no changes required. -DM 2 -Oteoporosis (parents and brother) - Father had CVA 11/2008  Social History: Reviewed history from 05/21/2008 and no changes required. Unemployed. Disability. Single. No children.  broke up with girlfriend (10/08) and this caused him much anxiety as he was afraid she was out to get him.  Feeling better from this now.   denies smoking, EtOH, rec drugs.  Review of Systems  The patient denies anorexia, fever, weight loss, chest pain, syncope, dyspnea on exertion, headaches, hemoptysis, abdominal pain, muscle weakness, and unusual weight change.    Physical Exam  General:  VS noted.  Well NAD Mouth:  MMM Lungs:  Normal respiratory effort, chest expands symmetrically. Lungs are clear to auscultation, no crackles or wheezes. Heart:  Normal rate and regular rhythm. S1 and S2 normal without gallop, murmur, click, rub or other extra sounds. Abdomen:  Bowel sounds positive,abdomen soft and non-tender without masses, organomegaly or hernias noted. Extremities:  Non edemetus BL LE Psych:  flattened affectOriented X3, memory intact for recent and remote, normally interactive, not anxious appearing, not depressed appearing, not agitated, not suicidal, and not homicidal.     Impression & Recommendations:  Problem # 1:  SEIZURE DISORDER, HX OF (ICD-V12.49) Assessment Unchanged Doing well. Klonipin refilled today as was dilantin. Checking dilantin level today. Stable. Given 3 month supply of Klonipin Orders: Dilantin-[facility] ([id number]) [facility]- Est Level  3 (53664)  Problem # 2:  HYPERTENSION (ICD-401.9) Assessment: Unchanged Stable. His updated medication list for this problem includes:    Toprol Xl 200 Mg Tb24 (Metoprolol succinate) .Marland Kitchen... 1 by mouth daily  Orders: T-Basic  Metabolic Panel [id number]) [facility]- Est Level  3 (99213)  BP today: 107/72 Prior BP: 109/76 (08/28/2010)  Labs Reviewed: K+: 4.7 (07/21/2010) Creat: : 1.06 (07/21/2010)   Chol: 127 (08/24/2009)   HDL: 42 (08/24/2009)   LDL: 52 (08/24/2009)   TG: 163 (08/24/2009)  Problem # 3:  HYPERLIPIDEMIA (ICD-272.4) Assessment: Unchanged Stable. Check lipids today. Is fasting. His updated medication list for this problem includes:    Zocor 40 Mg Tabs (Simvastatin) .Marland Kitchen... Take 1 tablet by mouth at bedtime  Orders: T-Lipid Profile [id number]) [facility]- Est Level  3 (95188)  Labs Reviewed: SGOT: 27 (02/02/2010)   SGPT: 40 (02/02/2010)   HDL:42 (08/24/2009), 56 (01/28/2008)  LDL:52 (08/24/2009), 84 (01/28/2008)  Chol:127 (08/24/2009), 160 (01/28/2008)  Trig:163 (08/24/2009), 102 (01/28/2008)  Problem # 4:  HYPOGONADISM, MALE S/P SELF-ORCHIECTOMY (ICD-257.2) Assessment: Unchanged Plan to get testerone level today. Orders: Testosterone-[facility] ([id number])  Complete Medication List: 1)  Klonopin 2 Mg Tabs (Clonazepam) .... Take 2 and a half tablets by mouth twice a day. 2)  Toprol Xl 200 Mg Tb24 (Metoprolol succinate) .Marland KitchenMarland KitchenMarland Kitchen 1  by mouth daily 3)  Zocor 40 Mg Tabs (Simvastatin) .... Take 1 tablet by mouth at bedtime 4)  Depo-testosterone 200 Mg/ml Oil (Testosterone cypionate) .... Inject 400 mg im every 2 weeks 5)  Prilosec Otc 20 Mg Tbec (Omeprazole magnesium) .... One daily 6)  Seroquel 300 Mg Tabs (Quetiapine fumarate) .... Take three by mouth daily 7)  Doxepin Hcl 100 Mg Caps (Doxepin hcl) .... Take nine by mouth daily 8)  Dilantin Infatabs 50 Mg Chew (Phenytoin) .... Take four by mouth once daily 9)  Oscal 500/200 D-3 500-200 Mg-unit Tabs (Calcium-vitamin d) .... Take one daily 10)  Docusate Sodium 100 Mg Caps (Docusate sodium) .... Take one by mouth daily 11)  Miralax Powd (Polyethylene glycol 3350) .Marland KitchenMarland Kitchen. 17 gm in 8 oz fluid qdaily as needed constipation 12)  Monoject Syringe 23g X 1" 3 Ml Misc  (Syringe/needle (disp)) .... Use as directed.  qs 1 month 13)  Cortisporin 3.5-10000-1 Soln (Neomycin-polymyxin-hc) .... 3 drops in right ear three times a day for 7 days  Other Orders: Flu Vaccine 88yrs + [phone]) Admin 1st Vaccine (32440) Admin 1st Vaccine [facility]) [phone])  Patient Instructions: 1)  Thank you for seeing me today. 2)  Please schedule a follow-up appointment in 3 months .  3)  Let me know if you feel bad or like hurting yourself or others.  4)  If you have chest pain, difficulty breathing, fevers over 102 that does not get better with tylenol please call us or see a doctor.  Prescriptions: MIRALAX  POWD (POLYETHYLENE GLYCOL 3350) 17 gm in 8 oz fluid qdaily as needed constipation  #1 x 12   Entered and Authorized by:   Clementeen Graham MD   Signed by:   Clementeen Graham MD on 09/20/2010   Method used:   Electronically to        [facility] (retail)       [address]       [place], [place]  [address]       Ph: [phone] or [phone]       Fax: [phone]   RxID:   [id number] DOCUSATE SODIUM 100 MG CAPS (DOCUSATE SODIUM) take one by mouth daily  #30 x 12   Entered and Authorized by:   Clementeen Graham MD   Signed by:   Clementeen Graham MD on 09/20/2010   Method used:   Electronically to        [facility] (retail)       [address]       [place], [place]  [address]       Ph: [phone] or [phone]       Fax: [phone]   RxID:   [id number] DEPO-TESTOSTERONE 200 MG/ML  OIL (TESTOSTERONE CYPIONATE) inject 400 mg IM every 2 weeks  #2 x 1   Entered and Authorized by:   Clementeen Graham MD   Signed by:   Clementeen Graham MD on 09/20/2010   Method used:   Print then Give to Patient   RxID:   [id number] KLONOPIN 2 MG TABS (CLONAZEPAM) Take 2 and a half tablets by mouth twice a day. Brand medically necessary #155 x 0   Entered and Authorized by:   Clementeen Graham MD   Signed by:   Clementeen Graham MD on 09/20/2010   Method used:   Print then Give to Patient   RxID:    [id number] DILANTIN INFATABS 50 MG  CHEW (PHENYTOIN) take four by mouth once daily  #  120 x 3   Entered and Authorized by:   Clementeen Graham MD   Signed by:   Clementeen Graham MD on 09/20/2010   Method used:   Electronically to        [facility] (retail)       [address]       [place], [place]  [address]       Ph: [phone] or [phone]       Fax: [phone]   RxID:   [id number] PRILOSEC OTC 20 MG  TBEC (OMEPRAZOLE MAGNESIUM) one daily  #31 x 1   Entered and Authorized by:   Clementeen Graham MD   Signed by:   Clementeen Graham MD on 09/20/2010   Method used:   Electronically to        [facility] (retail)       [address]       [place], [place]  [address]       Ph: [phone] or [phone]       Fax: [phone]   RxID:   [id number] ZOCOR 40 MG TABS (SIMVASTATIN) Take 1 tablet by mouth at bedtime  #90 x 3   Entered and Authorized by:   Clementeen Graham MD   Signed by:   Clementeen Graham MD on 09/20/2010   Method used:   Electronically to        [facility] (retail)       [address]       [place], [place]  [address]       Ph: [phone] or [phone]       Fax: [phone]   RxID:   [id number] TOPROL XL 200 MG  TB24 (METOPROLOL SUCCINATE) 1 by mouth daily  #30 x 6   Entered and Authorized by:   Clementeen Graham MD   Signed by:   Clementeen Graham MD on 09/20/2010   Method used:   Electronically to        [facility] (retail)       [address]       [place], [place]  [address]       Ph: [phone] or [phone]       Fax: [phone]   RxID:   [id number]    Prevention & Chronic Care Immunizations   Influenza vaccine: Fluvax 3+  (09/20/2010)   Influenza vaccine due: 10/28/2008    Tetanus booster: 08/24/2009: Tdap   Tetanus booster due: 08/25/2019    Pneumococcal vaccine: Not documented  Colorectal Screening   Hemoccult: Done.  (11/16/2005)   Hemoccult action/deferral: Not indicated  (08/24/2009)   Hemoccult due: Not Indicated    Colonoscopy:  normal  (02/04/2009)   Colonoscopy action/deferral: Repeat colonoscopy in 5 years.  miralax 17gm in 8 oz water 1-4 times per day for constipation  (02/04/2009)   Colonoscopy due: 02/04/2014  Other Screening   PSA: 0.98  (07/21/2010)   PSA action/deferral: Discussed-PSA requested  (08/24/2009)   PSA due due: 07/27/2009   Smoking status: never  (09/20/2010)  Lipids   Total Cholesterol: 127  (08/24/2009)   Lipid panel action/deferral: Lipid Panel ordered   LDL: 52  (08/24/2009)   LDL Direct: Not documented   HDL: 42  (08/24/2009)   Triglycerides: 163  (08/24/2009)    SGOT (AST): 27  (02/02/2010)   BMP action: Ordered   SGPT (ALT): 40  (02/02/2010)   Alkaline phosphatase: 74  (02/02/2010)   Total bilirubin: 0.4  (02/02/2010)    Lipid  flowsheet reviewed?: Yes   Progress toward LDL goal: At goal  Hypertension   Last Blood Pressure: 107 / 72  (09/20/2010)   Serum creatinine: 1.06  (07/21/2010)   BMP action: Ordered   Serum potassium 4.7  (07/21/2010)    Hypertension flowsheet reviewed?: Yes   Progress toward BP goal: At goal  Self-Management Support :   Personal Goals (by the next clinic visit) :      Personal blood pressure goal: 140/90  (02/02/2010)     Personal LDL goal: 130  (08/24/2009)    Patient will work on the following items until the next clinic visit to reach self-care goals:     Medications and monitoring: take my medicines every day, check my blood pressure, bring all of my medications to every visit  (09/20/2010)    Hypertension self-management support: Not documented    Lipid self-management support: Not documented    Nursing Instructions: Give Flu vaccine today    Influenza Vaccine    Vaccine Type: Fluvax 3+    Site: right deltoid    Mfr: GlaxoSmithKline    Dose: 0.5 ml    Route: IM    Given by: Garen Grams LPN    Exp. Date: 06/13/2011    Lot #: [id number]    VIS given: 07/11/10 version given September 20, 2010.  Flu Vaccine Consent  Questions    Do you have a history of severe allergic reactions to this vaccine? no    Any prior history of allergic reactions to egg and/or gelatin? no    Do you have a sensitivity to the preservative Thimersol? no    Do you have a past history of Guillan-Barre Syndrome? no    Do you currently have an acute febrile illness? no    Have you ever had a severe reaction to latex? no    Vaccine information given and explained to patient? yes

## 2011-01-16 NOTE — Progress Notes (Signed)
Summary: Test results  Phone Note Call from Patient Call back at [facility] Phone [phone]   Summary of Call: Pt is requesting lab results.  Told him we sent him a letter on 2/16, but he still wants to speak with someone about it. Initial call taken by: Haydee Salter,  February 04, 2008 3:10 PM  Follow-up for Phone Call        told him the results of his test Follow-up by: Golden Circle RN,  February 04, 2008 4:00 PM

## 2011-01-16 NOTE — Consult Note (Signed)
Summary: [facility] f/u colonoscopy 33yrs, constipation  [facility]   Imported By: Abundio Miu 03/11/2009 11:38:03  _____________________________________________________________________  External Attachment:    Type:   Image     Comment:   External Document

## 2011-01-16 NOTE — Letter (Signed)
Summary: Generic Letter  [facility]  [address]   [place], [place] [address]   Phone: [phone]  Fax: [phone]    08/26/2009  [facility] [place], [place]  [address]  Dear Mr. PILOT,  I have made an appointment for you at [facility] for your Dexa Scan.  It is on September 16 at 9:15 am.  Please do not take any calcium supplements for 24 hours prior to your test. If you have any questions, please call us at [phone]. Thank you!           Sincerely,   Modesta Messing LPN

## 2011-01-16 NOTE — Assessment & Plan Note (Signed)
Summary: f/u pneumonia, not feeling better/ACM   Vital Signs:  Patient Profile:   56 Years Old Male Weight:      187.3 pounds Temp:     98.5 degrees F Pulse rate:   92 / minute BP sitting:   138 / 87  Pt. in pain?   no  Vitals Entered By: Dedra Skeens CMA, (March 03, 2007 11:27 AM)                History of Present Illness: Seen on 3/13 with fever for 101.6, cough, vomiting x 5, and hoarseness.  Given rocephin x 1 and zithromax.  CXR negative for pneumonia.    Returns to clinic for f/u.  Fever has resolved, cough is improving, vomiting has subsided.   However, hoarseness remains and now cough is productive of white sputum.  denies fever, chills, night sweats, hemoptysis.  Prior Medications: ALBUTEROL 90 MCG/ACT AERS (ALBUTEROL) 2 puffs q4h as needed SOB/wheeze/cough ZITHROMAX 250 MG TABS (AZITHROMYCIN) take 2 tabs on day one then one tab daily x 4 days Current Allergies: ! SULFA  Past Medical History:    Reviewed history from 02/13/2007 and no changes required:       Constipation - 564.0, creatinine baseline:1.1/1.2, hypogonadism - self-orchiectomy while psychotic yr, Knee/leg sprain/strain:, unspecified - 844.9, Osteopenia (repeat Bone density in 01/2008, Rhinitis, Allergic - 477.9, see psych 4x/yr, sees therapist 6x/yr   Family History:    Reviewed history from 02/13/2007 and no changes required:       DM 2, osteoporosis (parents and brother)  Social History:    Reviewed history from 02/13/2007 and no changes required:       Unemployed.     Physical Exam  General:     Well-developed,well-nourished,in no acute distress; alert,appropriate and cooperative throughout examination Ears:     External ear exam shows no significant lesions or deformities.  Otoscopic examination reveals clear canals, tympanic membranes are intact bilaterally without bulging, retraction, inflammation or discharge. Hearing is grossly normal bilaterally. Mouth:     Oral mucosa and  oropharynx without lesions or exudates.  however, erythematous from coughing. Lungs:     Normal respiratory effort, chest expands symmetrically. Lungs are clear to auscultation, no crackles or wheezes. Heart:     Normal rate and regular rhythm. S1 and S2 normal without gallop, murmur, click, rub or other extra sounds.    Impression & Recommendations:  Problem # 1:  BRONCHITIS-ACUTE (ICD-466.0) Advised he complete the zithromax.  CXR shows no pneumonia.  However, he seems to be responding to the antibiotic (or time?).  Reassured patient that bronchitis can last up to 6 weeks.  Advised him to wait atleast 3-4 weeks as long as symptoms are improving before I would w/u the productive sputum any further.  Instructed he come back if he has hemoptysis, fevers, worsening cough, sob.  CXR shows old rib fractures, has h/o dilantin use.  May need bone scan to r/o osteoporosis.  Will leave to PCP.  His updated medication list for this problem includes:    Albuterol 90 Mcg/act Aers (Albuterol) .Marland Kitchen... 2 puffs q4h as needed sob/wheeze/cough    Zithromax 250 Mg Tabs (Azithromycin) .Marland Kitchen... Take 2 tabs on day one then one tab daily x 4 days  Orders: [facility]- Est Level  3 ([address])   Problem # 2:  LARYNGITIS-ACUTE (ICD-464.0) Reassured that laryngitis may take 2 weeks to resolve.  if it persists longer than 3-4 weeks, would recommend ENT referral for laryngoscopy.  Orders: [facility]- Est  Level  3 ([address])

## 2011-01-16 NOTE — Miscellaneous (Signed)
  Clinical Lists Changes  Observations: Added new observation of PSADUE: 05/2008 (05/21/2007 13:58) Added new observation of PSA: normal (05/20/2007 13:59)     Preventive Care Screening  PSA:    Date:  05/20/2007    Next Due:  05/2008    Results:  normal

## 2011-01-16 NOTE — Assessment & Plan Note (Signed)
Summary: wants test for DM & a nuclear stress test  Medications Added DEPO-TESTOSTERONE 200 MG/ML OIL (TESTOSTERONE CYPIONATE) Inject 400 mg intramuscularly as directed DILANTIN INFATABS 50 MG  CHEW (PHENYTOIN)  KLONOPIN 2 MG TABS (CLONAZEPAM) Take 2 tablet by mouth twice a day TOPROL XL 200 MG TB24 (METOPROLOL SUCCINATE) Take 1 tablet by mouth once a day ZOCOR 40 MG TABS (SIMVASTATIN) Take 1 tablet by mouth at bedtime        Vital Signs:  Patient Profile:   55 Years Old Male Weight:      194 pounds Pulse rate:   85 / minute BP sitting:   114 / 82  Vitals Entered By: Lillia Pauls CMA (July 01, 2007 2:05 PM)               Chief Complaint:  CHECK UP PER ER/SOME SOB ON EXERTION/? NUCLEAR STREES TEST NEEDED/PROSTATE EXAM.  History of Present Illness:  55 yo WM with hx of depression, sz disorder , hypogonadism ( self orchiectomy on testosterone replacement) and osteopenia states that he was send from the ER for a work up of SOB. PT IS HYPOCHONDRIAC  , reads a lot about medicine and then request lab work to r/u several pathology  despite not having any symptom. When asked about SOB, pt denies it. HE states that sometimes he breath heavy. He has a very sedentary life ( spends most of the time in bed or in the couch 2 to depression). He has hyperlipiedemia and does not exercise. HE denies SOB, CP, Ha, visual changes, claudication sx.   He wants prostate exam and ask to be checked for  homocysteine levels " I read that people gets check for that"  He is not currently taking Ca and vit D on daliy basis , despite his diagnosis of osteopenia 2 to hypogonadism and long term use of dilantin.   Meds:  dilantin 150 alternate w/  200  testosterone 400mg  q 2 wks.  Current Allergies: ! SULFA      Physical Exam  General:     Flat affect,  mild anxious ,in no acute distress; alert,appropriate and cooperative throughout examination Lungs:     Normal respiratory effort, chest expands  symmetrically. Lungs are clear to auscultation, no crackles or wheezes. Heart:     Normal rate and regular rhythm. S1 and S2 normal without gallop, murmur, click, rub or other extra sounds. Abdomen:     Bowel sounds positive,abdomen soft and non-tender without masses, organomegaly or hernias noted. Rectal:     No external abnormalities noted. Normal sphincter tone. No rectal masses or tenderness. Prostate:     Prostate gland firm and smooth, no enlargement, nodularity, tenderness, mass, asymmetry or induration. Pulses:     + 2 peripheral pulses    Impression & Recommendations:  Problem # 1:  OSTEOPENIA (ICD-733.90) Secondary to hypogonadism ( self orchiectomy ) plus dilantin for sz . Pt needs a repeat Bone density on 02/09. He has not comply with Vit D and Ca .HE also sometimes skips tertosterone IM.  I wil check Vit. D levels . Pt may need repeat Bone density sooner 2 to non compliance with meds. I have discussed the importance of compliant with meds. Next visit check testosterone levels Orders: [facility]- Est Level  3 (27253)  Future Orders: Miscellaneous Lab Charge-[facility] ([id number]) ... 07/02/2007   Problem # 2:  Preventive Health Care (ICD-V70.0) PSA WNL on 01/08. Prostate exam normal today. CA prostate less likely. I have explain to the  pt that he does not need homocysteine levels. Pt understood. Pt will return to check CbG's  (fasting) and A1c given his  Fhx of diabetes ( pt insisted on this) despite previous fasting BS WNL.  Since pt denied SOB , but has such a sedentary life/ hyperlipidemia,  I will check an EKG and schedule for an stress test for risk stratification. If clear, I have explain that exercise and sun exposure ( with sunblock ) will improve this pt's depression .  Orders: [facility]- Est Level  3 ([id number])   Problem # 3:  SYMPTOM, ABNORMALITY, RESPIRATORY NEC (ICD-786.09)  The following medications were removed from the medication list:    Albuterol 90 Mcg/act Aers (Albuterol)  .Marland Kitchen... 2 puffs q4h as needed sob/wheeze/cough  His updated medication list for this problem includes:    Toprol Xl 200 Mg Tb24 (Metoprolol succinate) .Marland Kitchen... Take 1 tablet by mouth once a day  Orders: [facility]- Est Level  3 ([id number])   Medications Added to Medication List This Visit: 1)  Depo-testosterone 200 Mg/ml Oil (Testosterone cypionate) .... Inject 400 mg intramuscularly as directed 2)  Dilantin Infatabs 50 Mg Chew (Phenytoin) 3)  Klonopin 2 Mg Tabs (Clonazepam) .... Take 2 tablet by mouth twice a day 4)  Toprol Xl 200 Mg Tb24 (Metoprolol succinate) .... Take 1 tablet by mouth once a day 5)  Zocor 40 Mg Tabs (Simvastatin) .... Take 1 tablet by mouth at bedtime  Other Orders: Future Orders: Dilantin-[facility] ([id number]) ... 07/02/2007 Comp Met-[facility] ([id number]) ... 07/02/2007   Patient Instructions: 1)  Take daily Calcium and Vitamin D and testosterone to avoid bone deterioration 2)  Start exercising ( waking daily ) at least 5 minutes . 3)  Sun light exposure ( at least 5 minutes) with sunscreen at least 30 but avoid doing so between 11 am and 3 pm.    Prescriptions: DILANTIN INFATABS 50 MG  CHEW (PHENYTOIN)   #91 x 12   Entered and Authorized by:   Jackalyn Lombard MD   Signed by:   Jackalyn Lombard MD on 07/01/2007   Method used:   Print then Give to Patient   RxID:   [id number] DILANTIN INFATABS 50 MG  CHEW (PHENYTOIN)   #0 x 0   Entered and Authorized by:   Jackalyn Lombard MD   Signed by:   Jackalyn Lombard MD on 07/01/2007   Method used:   Print then Give to Patient   RxID:   [id number] ZOCOR 40 MG TABS (SIMVASTATIN) Take 1 tablet by mouth at bedtime  #30 x 12   Entered and Authorized by:   Jackalyn Lombard MD   Signed by:   Jackalyn Lombard MD on 07/01/2007   Method used:   Electronically sent to ...       [facility]*       [address]       [place], [place]  [address]       Ph: [phone] or [phone]       Fax:  [phone]   RxID:   [id number] TOPROL XL 200 MG TB24 (METOPROLOL SUCCINATE) Take 1 tablet by mouth once a day  #30 x 12   Entered and Authorized by:   Jackalyn Lombard MD   Signed by:   Jackalyn Lombard MD on 07/01/2007   Method used:   Electronically sent to ...       [facility]*  [address]       [place], [place]  [address]       Ph: [phone] or [phone]       Fax: [phone]   RxID:   [id number] KLONOPIN 2 MG TABS (CLONAZEPAM) Take 2 tablet by mouth twice a day  #120 x 6   Entered and Authorized by:   Jackalyn Lombard MD   Signed by:   Jackalyn Lombard MD on 07/01/2007   Method used:   Electronically sent to ...       [facility]*       [address]       [place], [place]  [address]       Ph: [phone] or [phone]       Fax: [phone]   RxID:   [id number] DEPO-TESTOSTERONE 200 MG/ML OIL (TESTOSTERONE CYPIONATE) Inject 400 mg intramuscularly as directed  #4 x 12   Entered and Authorized by:   Jackalyn Lombard MD   Signed by:   Jackalyn Lombard MD on 07/01/2007   Method used:   Electronically sent to ...       [facility]*       [address]       [place], [place]  [address]       Ph: [phone] or [phone]       Fax: [phone]   RxID:   [id number]

## 2011-01-16 NOTE — Assessment & Plan Note (Signed)
Summary: DNKA      Current Allergies: ! SULFA        Complete Medication List: 1)  Klonopin 2 Mg Tabs (Clonazepam) .... Take 2 and a half tablets by mouth twice a day 2)  Toprol Xl 200 Mg Tb24 (Metoprolol succinate) .Marland Kitchen.. 1 by mouth daily 3)  Zocor 40 Mg Tabs (Simvastatin) .... Take 1 tablet by mouth at bedtime 4)  Depo-testosterone 200 Mg/ml Oil (Testosterone cypionate) .... Inject 400 mg im every 2 weeks 5)  Prilosec Otc 20 Mg Tbec (Omeprazole magnesium) .... One daily 6)  Seroquel 300 Mg Tabs (Quetiapine fumarate) .... Take two by mouth daily 7)  Doxepin Hcl 100 Mg Caps (Doxepin hcl) .... Take nine by mouth daily 8)  Oscal 500/200 D-3 500-200 Mg-unit Tabs (Calcium-vitamin d) .... Take one daily 9)  Dilantin Infatabs 50 Mg Chew (Phenytoin) .... Take four by mouth once daily    ]

## 2011-01-16 NOTE — Progress Notes (Signed)
Summary: wi request  Phone Note Call from Patient Call back at [phone]   Reason for Call: Talk to Nurse Summary of Call: pt has a sore throat & can barely swallow, he is also having trouble breathing Initial call taken by: ERIN LEVAN,  February 27, 2007 2:37 PM   appt now-will work in from nurse sched

## 2011-01-18 NOTE — Miscellaneous (Signed)
  Clinical Lists Changes  Problems: Removed problem of UNSPECIFIED BACTERIAL PNEUMONIA (ICD-482.9) Removed problem of PREVENTIVE HEALTH CARE (ICD-V70.0)

## 2011-01-22 ENCOUNTER — Telehealth: Payer: Self-pay | Admitting: Family Medicine

## 2011-02-01 NOTE — Progress Notes (Signed)
  Phone Note Refill Request Call back at [phone]   Refills Requested: Medication #1:  KLONOPIN 2 MG TABS Take 2 and a half tablets by mouth twice a day. [BMN]  Medication #2:  DILANTIN INFATABS 50 MG  CHEW take four by mouth once daily pt need refill.  Call when ready  Initial call taken by: Abundio Miu,  January 22, 2011 1:50 PM

## 2011-03-22 LAB — URINALYSIS, ROUTINE W REFLEX MICROSCOPIC
Bilirubin Urine: NEGATIVE
Glucose, UA: NEGATIVE mg/dL
Ketones, ur: NEGATIVE mg/dL
Nitrite: POSITIVE — AB
Protein, ur: NEGATIVE mg/dL
Specific Gravity, Urine: 1.02 (ref 1.005–1.030)
Urobilinogen, UA: 0.2 mg/dL (ref 0.0–1.0)
pH: 5.5 (ref 5.0–8.0)

## 2011-03-22 LAB — URINE MICROSCOPIC-ADD ON

## 2011-03-27 LAB — BASIC METABOLIC PANEL
BUN: 10 mg/dL (ref 6–23)
CO2: 27 mEq/L (ref 19–32)
Calcium: 8.8 mg/dL (ref 8.4–10.5)
Chloride: 107 mEq/L (ref 96–112)
Creatinine, Ser: 1.1 mg/dL (ref 0.4–1.5)
GFR calc Af Amer: >60 mL/min (ref >60)
GFR calc non Af Amer: >60 mL/min (ref >60)
Glucose, Bld: 103 mg/dL — ABNORMAL HIGH (ref 70–99)
Potassium: 3.6 mEq/L (ref 3.5–5.1)
Sodium: 139 mEq/L (ref 135–145)

## 2011-03-27 LAB — CBC
HCT: 45 % (ref 39.0–52.0)
Hemoglobin: 15.8 g/dL (ref 13.0–17.0)
MCHC: 35 g/dL (ref 30.0–36.0)
MCV: 98.7 fL (ref 78.0–100.0)
Platelets: 192 10*3/uL (ref 150–400)
RBC: 4.56 MIL/uL (ref 4.22–5.81)
RDW: 14.1 % (ref 11.5–15.5)
WBC: 4.7 10*3/uL (ref 4.0–10.5)

## 2011-03-27 LAB — POCT I-STAT, CHEM 8
BUN: 10 mg/dL (ref 6–23)
Calcium, Ion: 1.16 mmol/L (ref 1.12–1.32)
Chloride: 108 mEq/L (ref 96–112)
Creatinine, Ser: 1.2 mg/dL (ref 0.4–1.5)
Glucose, Bld: 98 mg/dL (ref 70–99)
HCT: 50 % (ref 39.0–52.0)
Hemoglobin: 17 g/dL (ref 13.0–17.0)
Potassium: 3.7 mEq/L (ref 3.5–5.1)
Sodium: 139 mEq/L (ref 135–145)
TCO2: 24 mmol/L (ref 0–100)

## 2011-03-27 LAB — APTT: aPTT: 24 seconds (ref 24–37)

## 2011-03-27 LAB — PROTIME-INR
INR: 1 (ref 0.00–1.49)
Prothrombin Time: 12.9 seconds (ref 11.6–15.2)

## 2011-03-27 LAB — PHENYTOIN LEVEL, TOTAL: Phenytoin Lvl: 15.3 ug/mL (ref 10.0–20.0)

## 2011-03-27 LAB — URINALYSIS, ROUTINE W REFLEX MICROSCOPIC
Bilirubin Urine: NEGATIVE
Glucose, UA: NEGATIVE mg/dL
Hgb urine dipstick: NEGATIVE
Ketones, ur: 15 mg/dL — AB
Nitrite: POSITIVE — AB
Protein, ur: NEGATIVE mg/dL
Specific Gravity, Urine: 1.02 (ref 1.005–1.030)
Urobilinogen, UA: 0.2 mg/dL (ref 0.0–1.0)
pH: 5.5 (ref 5.0–8.0)

## 2011-03-27 LAB — URINE MICROSCOPIC-ADD ON

## 2011-03-27 LAB — DIFFERENTIAL
Basophils Absolute: 0 10*3/uL (ref 0.0–0.1)
Basophils Relative: 1 % (ref 0–1)
Eosinophils Absolute: 0.1 10*3/uL (ref 0.0–0.7)
Eosinophils Relative: 2 % (ref 0–5)
Lymphocytes Relative: 24 % (ref 12–46)
Lymphs Abs: 1.1 10*3/uL (ref 0.7–4.0)
Monocytes Absolute: 0.3 10*3/uL (ref 0.1–1.0)
Monocytes Relative: 6 % (ref 3–12)
Neutro Abs: 3.2 10*3/uL (ref 1.7–7.7)
Neutrophils Relative %: 68 % (ref 43–77)

## 2011-03-27 LAB — URINE CULTURE: Colony Count: >=100000

## 2011-03-27 LAB — RAPID URINE DRUG SCREEN, HOSP PERFORMED
Amphetamines: NOT DETECTED
Barbiturates: NOT DETECTED
Benzodiazepines: POSITIVE — AB
Cocaine: NOT DETECTED
Opiates: NOT DETECTED
Tetrahydrocannabinol: NOT DETECTED

## 2011-03-27 LAB — POCT CARDIAC MARKERS
CKMB, poc: <1 ng/mL — ABNORMAL LOW (ref 1.0–8.0)
Myoglobin, poc: 101 ng/mL (ref 12–200)
Troponin i, poc: <0.05 ng/mL (ref 0.00–0.09)

## 2011-04-18 ENCOUNTER — Emergency Department (HOSPITAL_COMMUNITY): Payer: Medicaid Other

## 2011-04-18 ENCOUNTER — Inpatient Hospital Stay (HOSPITAL_COMMUNITY)
Admission: EM | Admit: 2011-04-18 | Discharge: 2011-04-20 | DRG: 494 | Disposition: A | Discharge status: Home Health | Payer: Medicaid Other | Source: Ambulatory Visit | Attending: Orthopedic Surgery | Admitting: Orthopedic Surgery

## 2011-04-18 DIAGNOSIS — Y92009 Unspecified place in unspecified non-institutional (private) residence as the place of occurrence of the external cause: Secondary | ICD-10-CM

## 2011-04-18 DIAGNOSIS — Z882 Allergy status to sulfonamides status: Secondary | ICD-10-CM

## 2011-04-18 DIAGNOSIS — I10 Essential (primary) hypertension: Secondary | ICD-10-CM | POA: Diagnosis present

## 2011-04-18 DIAGNOSIS — S46819A Strain of other muscles, fascia and tendons at shoulder and upper arm level, unspecified arm, initial encounter: Secondary | ICD-10-CM | POA: Diagnosis present

## 2011-04-18 DIAGNOSIS — F319 Bipolar disorder, unspecified: Secondary | ICD-10-CM | POA: Diagnosis present

## 2011-04-18 DIAGNOSIS — E7800 Pure hypercholesterolemia, unspecified: Secondary | ICD-10-CM | POA: Diagnosis present

## 2011-04-18 DIAGNOSIS — W010XXA Fall on same level from slipping, tripping and stumbling without subsequent striking against object, initial encounter: Secondary | ICD-10-CM | POA: Diagnosis present

## 2011-04-18 DIAGNOSIS — S43499A Other sprain of unspecified shoulder joint, initial encounter: Secondary | ICD-10-CM | POA: Diagnosis present

## 2011-04-18 DIAGNOSIS — G40909 Epilepsy, unspecified, not intractable, without status epilepticus: Secondary | ICD-10-CM | POA: Diagnosis present

## 2011-04-18 DIAGNOSIS — S42253A Displaced fracture of greater tuberosity of unspecified humerus, initial encounter for closed fracture: Principal | ICD-10-CM | POA: Diagnosis present

## 2011-04-18 LAB — CBC
HCT: 41.9 % (ref 39.0–52.0)
Hemoglobin: 15.2 g/dL (ref 13.0–17.0)
MCH: 33.6 pg (ref 26.0–34.0)
MCHC: 36.3 g/dL — ABNORMAL HIGH (ref 30.0–36.0)
MCV: 92.7 fL (ref 78.0–100.0)
Platelets: 162 10*3/uL (ref 150–400)
RBC: 4.52 MIL/uL (ref 4.22–5.81)
RDW: 14.3 % (ref 11.5–15.5)
WBC: 14.7 10*3/uL — ABNORMAL HIGH (ref 4.0–10.5)

## 2011-04-18 LAB — TYPE AND SCREEN
ABO/RH(D): A POS
Antibody Screen: NEGATIVE

## 2011-04-18 LAB — PHENYTOIN LEVEL, TOTAL: Phenytoin Lvl: 18.7 ug/mL (ref 10.0–20.0)

## 2011-04-18 LAB — BASIC METABOLIC PANEL
BUN: 14 mg/dL (ref 6–23)
CO2: 23 mEq/L (ref 19–32)
Calcium: 8.6 mg/dL (ref 8.4–10.5)
Chloride: 100 mEq/L (ref 96–112)
Creatinine, Ser: 0.91 mg/dL (ref 0.4–1.5)
GFR calc Af Amer: >60 mL/min (ref >60)
GFR calc non Af Amer: >60 mL/min (ref >60)
Glucose, Bld: 111 mg/dL — ABNORMAL HIGH (ref 70–99)
Potassium: 4 mEq/L (ref 3.5–5.1)
Sodium: 135 mEq/L (ref 135–145)

## 2011-04-18 LAB — PROTIME-INR
INR: 0.98 (ref 0.00–1.49)
Prothrombin Time: 13.2 seconds (ref 11.6–15.2)

## 2011-04-18 LAB — ABO/RH: ABO/RH(D): A POS

## 2011-04-19 ENCOUNTER — Inpatient Hospital Stay (HOSPITAL_COMMUNITY): Payer: Medicaid Other

## 2011-04-19 LAB — MRSA PCR SCREENING: MRSA by PCR: NEGATIVE

## 2011-04-19 NOTE — H&P (Signed)
[name], [name]                  ACCOUNT NO.:  [id number]  MEDICAL RECORD NO.:  [id number]           PATIENT TYPE:  E  LOCATION:  [facility]                         FACILITY:  [facility]  PHYSICIAN:  Harvie Junior, M.D.   DATE OF BIRTH:  March 01, 1956  DATE OF ADMISSION:  04/18/2011 DATE OF DISCHARGE:                             HISTORY & PHYSICAL   This is a 55 year old male who was involved in a slip and fall earlier today.  He suffered a dislocation.  He was evaluated by the emergency room doctors, and we were consulted.  His initial images showed an irreducible dislocation.  They tried multiple times in the emergency room and we were on our way to see him when they called back and said they were able to get his shoulder reduced.  Unfortunately, his post reduction films showed that the fragment that had been initially displaced was essentially sitting in the subacromial space.  At that point, it become clear that he needed something significant done for this and we are going to admit him to ultimately get him treated while he is here, and he will be admitted for the reasons of taking him to the operating room when operating time is available.  The patient denies any numbness or tingling and says that his pain is dramatically better since he has been put back in the socket.  PAST MEDICAL HISTORY:  Remarkable for having had a bipolar disorder, depression, hypercholesterolemia, hypertension, and seizure disorder. He is claims to be a nondrinker, nonsmoker, but he clearly had some drinks on the evening of admission.  FAMILY HISTORY:  Reviewed, noncontributory.  REVIEW OF SYSTEMS:  Otherwise unremarkable.  The patient has had a bilateral orchiectomy in the past.  MEDICATIONS:  On admission are clonazepam, Dilantin, extended Seroquel, simvastatin, and Sinequan.  PHYSICAL EXAMINATION:  VITAL SIGNS:  His blood pressure is 138/85, his pulse is 83, respirations 18, pulse ox 95%, temperature  is 98.0. GENERAL:  He is a well-developed, well-nourished male, in no acute distress.  His eyes are not dilated.  He is not using accessory muscles of respiration.  He is alert and oriented x3. EXTREMITIES:  His right upper extremity is neurovascularly intact.  He has soft tissue swelling over the shoulder, good distal pulse with no abrasions on the right upper extremity.  Initial x-rays showed that he had a fracture-dislocation of the shoulder with a large greater tuberosity piece.  Post reduction films showed that there was a piece in the subacromial space.  CAT scan was ordered and it has not been completed at the time of this history and physical.  The patient will be admitted to the hospital and ultimately will be evaluated by Dr. Jones Broom for acute surgical intervention.  The patient will need open reduction and internal fixation of his greater tuberosity fragment and probable repair of a rotator cuff.  He will assume his care later in the day or in the morning.     Harvie Junior, M.D.     Ranae Plumber  D:  04/18/2011  T:  04/18/2011  Job:  [id number]  Electronically Signed by Jodi Geralds M.D. on 04/19/2011 03:23:31 PM

## 2011-04-20 LAB — CBC
HCT: 40.4 % (ref 39.0–52.0)
Hemoglobin: 14 g/dL (ref 13.0–17.0)
MCH: 32.9 pg (ref 26.0–34.0)
MCHC: 34.7 g/dL (ref 30.0–36.0)
MCV: 95.1 fL (ref 78.0–100.0)
Platelets: 136 10*3/uL — ABNORMAL LOW (ref 150–400)
RBC: 4.25 MIL/uL (ref 4.22–5.81)
RDW: 14.4 % (ref 11.5–15.5)
WBC: 8.4 10*3/uL (ref 4.0–10.5)

## 2011-04-20 LAB — BASIC METABOLIC PANEL
BUN: 6 mg/dL (ref 6–23)
CO2: 26 mEq/L (ref 19–32)
Calcium: 8.1 mg/dL — ABNORMAL LOW (ref 8.4–10.5)
Chloride: 103 mEq/L (ref 96–112)
Creatinine, Ser: 0.71 mg/dL (ref 0.4–1.5)
GFR calc Af Amer: >60 mL/min (ref >60)
GFR calc non Af Amer: >60 mL/min (ref >60)
Glucose, Bld: 147 mg/dL — ABNORMAL HIGH (ref 70–99)
Potassium: 3.9 mEq/L (ref 3.5–5.1)
Sodium: 133 mEq/L — ABNORMAL LOW (ref 135–145)

## 2011-04-20 NOTE — Consult Note (Signed)
[name], [name]                  ACCOUNT NO.:  [id number]  MEDICAL RECORD NO.:  [id number]           PATIENT TYPE:  I  LOCATION:  5012                         FACILITY:  [facility]  PHYSICIAN:  Jones Broom, MD    DATE OF BIRTH:  May 10, 1956  DATE OF CONSULTATION: DATE OF DISCHARGE:                                CONSULTATION   REASON FOR CONSULTATION:  Evaluation of right proximal humerus fracture dislocation.  CONSULTING PHYSICIAN:  Harvie Junior, MD  HISTORY OF PRESENT ILLNESS:  [name] is a 55 year old gentleman with a history of bipolar disorder who fell last night suffering a fracture dislocation of the right shoulder.  He was seen in the emergency department where a closed reduction was obtained.  Postreduction films demonstrated residual piece of greater tuberosity in the subacromial space.  Prereduction film showed a large greater tuberosity fragment.  I was consulted for evaluation and management given his complex shoulderinjury.  At this point, he complains of pain in the right shoulder which is mild at rest.  No numbness or tingling distally.  Pain is worse with any attempted motion, better with rest.  PAST MEDICAL HISTORY:  Positive for 1. Bipolar disorder. 2. Depression. 3. Hypercholesterolemia. 4. Hypertension. 5. Seizure disorder.  His last seizure was over a year ago.  FAMILY HISTORY:  Reviewed.  Noncontributory.  REVIEW OF SYSTEMS:  Otherwise, negative.  MEDICATIONS ON ADMISSION:  Clonazepam, Dilantin, extended Seroquel, simvastatin and Sinequan.  PHYSICAL EXAMINATION:  VITAL SIGNS:  On exam, his blood pressure is 135/85, pulse 83, respirations 18, pulse ox 95%, temperature 98.0. GENERAL:  He is a well-developed, well-nourished male in no acute distress.  He is awake, alert and oriented x3. EXTREMITIES:  Examination of bilateral upper extremities demonstrates no skin rash or skin breakdown.  Right upper extremities in a sling.  Range of motion in  shoulder not tested secondary to pain.  He has intact sensation to light touch in C5-T1 nerve distribution with intact biceps, triceps firing and distally intact motor and sensory in the median, ulnar, and radial nerve distribution with 2+ radial pulse.  No other injuries are noted on exam.  DIAGNOSTIC STUDIES:  X-rays including pre and post reduction x-rays of the right shoulder from the emergency department demonstrate anterior inferior dislocation with large greater tuberosity fragment which is displaced pre reduction.  Post reduction, there is fragment in the subacromial space.  CT scan is pending.  IMPRESSION AND PLAN:  Right shoulder fracture dislocation with a greater tuberosity fracture.  PLAN:  He will require operative fixation to restore integrity to the rotator cuff mechanism given the displaced greater tuberosity fragment. We talked about risks, benefits and alternatives to surgery and he would like to go ahead with surgery.  Consent is signed on the chart.  Plan will be for discharge postoperative day #1 if he does well as expected. He will be transferred to my service.     Jones Broom, MD     JC/MEDQ  D:  04/18/2011  T:  04/19/2011  Job:  [id number]  Electronically Signed by Jones Broom  on 04/20/2011 08:24:38  AM

## 2011-04-20 NOTE — Op Note (Signed)
NAMESHAUGHN, THOMLEY                  ACCOUNT NO.:  [id number]  MEDICAL RECORD NO.:  [id number]           PATIENT TYPE:  I  LOCATION:  5012                         FACILITY:  [facility]  PHYSICIAN:  Jones Broom, MD    DATE OF BIRTH:  08-17-1956  DATE OF PROCEDURE:  04/19/2011 DATE OF DISCHARGE:                              OPERATIVE REPORT   PREOPERATIVE DIAGNOSIS:  Right shoulder greater tuberosity fracture.  POSTOPERATIVE DIAGNOSIS:  Right shoulder greater tuberosity fracture.  PROCEDURE PERFORMED:  Open reduction and internal fixation of right shoulder greater tuberosity fracture.  ATTENDING SURGEON:  Jones Broom, MD  ASSISTANT:  None.  ANESTHESIA:  GETA with preoperative interscalene block.  COMPLICATIONS:  None.  DRAINS:  None.  SPECIMENS:  None.  ESTIMATED BLOOD LOSS:  50 mL.  INDICATIONS FOR SURGERY:  The patient is a 55 year old gentleman who suffered a fracture, dislocation of the right shoulder.  He was admitted by my partner, Dr. Jodi Geralds.  Dr. Luiz Blare asked for my assistance and caring for this patient.  After closed reduction in the emergency department, he was noted to have a comminuted fracture of the greater tuberosity with retraction of the rotator cuff.  He was indicated for surgical treatment to restore function of the rotator cuff.  He understood risks, benefits and alternatives of procedure including but not limited to risk of bleeding, infection, damage to neurovascular structures, risk of nonunion, malunion and potential need for future surgery.  He elected to go forward with surgery.  OPERATIVE FINDINGS:  The fracture was noted to be comminuted, but consisted of two main fragments of posterior and anterior fragment. These were controlled with sutures and subsequently fixed in a near anatomic configuration with a combination of suture fixation and two 4.0 cannulated screws with washers.  One 3.5 cortical screw was used distally in the shaft  as opposed to tie the suture construct around. Rotator cuff was completely restored.  PROCEDURE:  The patient was identified in the preoperative holding area where I personally marked the operative site after verifying site, side and procedure with the patient.  He was taken back to the operating room where he was transferred to the operative table and general anesthesia was induced without complication.  He was placed in a beach-chair position, sitting up about 60 degrees.  The right upper extremity was prepped and draped in the standard sterile fashion.  He did receive 1 g of IV Ancef.  The appropriate time-out procedure was carried out. Approximately 8-cm incision was made in a transverse fashion in line with Langer's lines just off the lateral edge of the acromion with approximately half the incision anterior and half the incision posterior to the anterior edge of the acromion.  Dissection was carried down at the level above of the deltoid fascia and subcutaneous flaps were elevated anterior and posterior.  The lateral deltoid was then measured for 4.5 cm from the lateral acromion and great care was taken that no dissection was carried beyond this point.  A raphe was identified in the lateral deltoid, which was split longitudinally and the underlying bursa was excised.  The deltoid attachment was then taken off the anterior edge of the acromion.  The deltoid was retracted as well as the skin and the underlying fracture was then visualized.  The anterior bone fragment was first to be visualized and was grasped with a Kocher.  This was then gained control with a #2 FiberWire, which was used in a locking horizontal mattress-type configuration with the sutures on the superior aspect of the cuff.  Pulling traction on this, I was then able to bring the posterior bone fragments lateral and get gain control with two additional FiberWires.  Once these sutures were placed, I could reduce the  fragments to the bony bed nicely restoring the entire rotator cuff. The lesser tuberosity was not involved.  The fragments were felt to be large enough that I could place some cannulated screws to assist in holding the reduction and applying some compression.  Therefore, the anterior and posterior fragments were pinned in place with the guidewire from the cannulated screw set.  The reduction was verified with fluoroscopic imaging and adjusted appropriately.  Two 4.0 cannulated partially threaded screws were then placed over the guidewires after drilling the lateral cortex obtaining nice compression and fixation. The anterior and posterior FiberWires were tied together to bring the anterior and posterior leaves together.  It was then decided to place a 3.5-mm cortical screw distally to allow a post to tie these sutures over to reinforce the repair via the rotator cuff.  All the FiberWire sutures were then brought around this 3.5-mm bicortical screw distal to the fracture under washer and tied in place.  The washer and the screw were then tightened down over.  Excellent fixation was noted.  Fluoroscopic imaging was then used in AP and lateral planes and with live fluoroscopy to verify appropriate reduction of the fracture.  I could visualize complete restoration of the rotator cuff.  The wound was then copiously irrigated with normal saline and closed in layers with #2 Ethibond closing the deltoid back to the anterior acromion through the bone with figure-of-eight fashion.  The lateral deltoid was then closed with 0 Vicryl and skin was closed with 2-0 Vicryl in a deep dermal layer and staples.  Sterile dressings were then applied including Adaptic, 4x4s, and tape.  The patient was placed in a sling, allowed to awaken from general anesthesia, transferred to the stretcher and taken to the recovery room in stable condition.  POSTOPERATIVE PLAN:  He will be readmitted to my service and will  be kept overnight for pain control.  He will likely be discharged tomorrow in a sling.  He can begin to work with physical therapy with gentle passive motion to 90 degrees forward flexion and neutral external rotation.     Jones Broom, MD     JC/MEDQ  D:  04/19/2011  T:  04/20/2011  Job:  [id number]  Electronically Signed by Jones Broom  on 04/20/2011 08:21:22 AM

## 2011-04-23 NOTE — Discharge Summary (Signed)
  [name], [name]                  ACCOUNT NO.:  [id number]  MEDICAL RECORD NO.:  [id number]           PATIENT TYPE:  I  LOCATION:  5012                         FACILITY:  [facility]  PHYSICIAN:  Jones Broom, MD    DATE OF BIRTH:  Oct 02, 1956  DATE OF ADMISSION:  04/18/2011 DATE OF DISCHARGE:  04/20/2011                              DISCHARGE SUMMARY   FINAL DIAGNOSES: 1. Right shoulder proximal humerus fracture. 2. Bipolar disorder. 3. Depression. 4. Hypercholesterolemia. 5. Hypertension. 6. Seizure disorder.  HOSPITAL COURSE:  The patient was admitted on Apr 18, 2011, by Dr. Luiz Blare after he had a slip and fall, suffering a right shoulder fracture dislocation.  He was reduced in the emergency department, but was noted to have significant fracture with a greater tuberosity with comminution. He was indicated for surgical treatment to restore function of the rotator cuff.  He was taken to the operating room on Apr 19, 2011, by Dr. Ave Filter.  Surgery was uneventful, and he recovered well on the floor postoperatively.  After his block wore off on postoperative day #1, he had a lot of pain.  At the time of this dictation, his discharge was pending getting his pain under control.  He is also going to work with Occupational Therapy with range of motion of 90 degrees forward flexion, external rotation to zero in a passive manner only.  If his pain is well controlled, that day he will be discharged home.  If not, he will be kept another night.  His dressing was clean, dry, and intact.  Distally, he is neurovascularly intact.  I was unable to reliably see firing at the deltoid muscle, but I think this is likely due to pain.  DISCHARGE INSTRUCTIONS:  He will discontinue his dressing on postoperative day #1.  He will work on gentle passive stretching. Otherwise, he will stay in a sling.  He will follow up in 10-14 days. He will be discharged with Percocet and Colace in addition to his  home medications.     Jones Broom, MD     JC/MEDQ  D:  04/20/2011  T:  04/20/2011  Job:  [id number]  Electronically Signed by Jones Broom  on 04/23/2011 07:54:07 AM

## 2011-05-01 NOTE — H&P (Signed)
NAMEDAVANTA, MEUSER                  ACCOUNT NO.:  [id number]   MEDICAL RECORD NO.:  [id number]          PATIENT TYPE:  INP   LOCATION:  0105                         FACILITY:  [facility]   PHYSICIAN:  Isidor Holts, M.D.  DATE OF BIRTH:  03-12-1956   DATE OF ADMISSION:  12/07/2008  DATE OF DISCHARGE:                              HISTORY & PHYSICAL   PRIORITY ADMISSION HISTORY AND PHYSICAL   PRIMARY CARE PHYSICIAN:  Viviann Spare A. Waynette Buttery, MD, [facility].  Patient is assigned to [place].   CHIEF COMPLAINT:  Constipation for 1 week, abdominal pain for 1 day.   HISTORY OF PRESENT ILLNESS:  This is a 55 year old male. For past  medical history, see below.  According to patient, he has had quite  severe constipation for the past 7 days, has had difficulty in passing  stools and when he does manage to, these are very small bowel movements.  He took an OTC laxative in the a.m. of December 07, 2008, and following  this about 5 hours later, he had generalized crampy abdominal pains,  which have since increased in both severity and frequency, no bowel  movement, no vomiting, no pyrexia, no chest pain or shortness of breath.  He had, however, passed a rather large bowel movement in the emergency  department by the time of this evaluation.   PAST MEDICAL HISTORY:  1. Hypertension.  2. Seizure disorder, last seizure was approximately 3 months ago.  3. Depression, anxiety/panic disorder.  4. History of gastritis, April 2005, confirmed by EGD. H. pylori      negative.  5. Hyperplastic polyps on colonoscopy, April 2005 (gastroenterologist,      Dr. Loreta Ave).  6. Status post gum trimming for Dilantin-induced gingival hyperplasia      in the past.  7. Status post self-inflicted bilateral castration in the remote past.      He required surgery at the time, to control bleeding.  8. Dyslipidemia.   MEDICATION HISTORY:  1. Dilantin 200 mg p.o. daily.  2. Klonopin ( 2 mg) 2 pills q.a.m.  and 3 pills q.h.s.  3. Simvastatin (query dosage), 1 p.o. q.h.s.  4. Testosterone cypionate 1 injection IM q.2 weekly (last injection      was approximately 2 weeks ago).  5. Toprol-XL (query dosage), 1 p.o. daily.  6. Seroquel (300 mg) 1 p.o. q.a.m. and 2 pills p.o. q.h.s.   ALLERGIES:  SULFA.   SYSTEMS REVIEW:  As in patient history and chief complaint.  Denies  vomiting, denies chest pain or shortness of breath, denies pyrexia,  denies chills.   SOCIAL HISTORY:  The patient has been on disability since 55.  He is  educated, and has gone through graduate school.  Single, no offspring,  nonsmoker, nondrinker, no history of drug abuse.   FAMILY HISTORY:  Positive for diabetes mellitus in mother and 2 maternal  uncles.  His father is age 43 years, is diabetic and is status post CVA.  He had 2 brothers, 1 was hypertensive and died status post infective  complications following hip surgery, and  the other is alive, but is  diabetic.   PHYSICAL EXAMINATION:  VITAL SIGNS:  Temperature 98.1, pulse 78 per  minute and regular, respiratory rate 18, BP 151/95 mmHg, rechecked  142/92 mmHg, pulse oximetry 98% on 3 L of oxygen.  GENERAL:  The patient did not appear to be in obvious acute distress at  the time of this evaluation and as a matter of fact had just passed a  rather large bowel movement while in the emergency department, alert,  communicative, and not short of breath at rest.  HEENT:  No clinical pallor, no jaundice, no conjunctival injection,  throat is clear.  NECK:  Supple with JVP not seen, no palpable lymphadenopathy or palpable  goiter.  CHEST:  Clinically clear to auscultation, no wheezes or crackles.  HEART:  Heart sounds 1 and 2 heard, normal, regular, no murmurs.  ABDOMEN:  Full, soft, nontender, nondistended, no palpable organomegaly  is palpable, normal bowel sounds.  LOWER EXTREMITIES:  Minimal pitting edema, palpable peripheral pulses.  MUSCULOSKELETAL:  Exam  unremarkable.  CENTRAL NERVOUS SYSTEM:  No focal neurologic deficit on gross  examination.   INVESTIGATION:  CBC:  WBC 12.6, hemoglobin is 16.4, hematocrit 48.9,  platelets 231.  Electrolytes:  Sodium 135, potassium 3.8, chloride 96,  CO2 29, BUN 13, creatinine 1.31, glucose 130, AST 33, ALT 34, alkaline-  phosphatase 89, lipase 60.  Urine drug screen negative.  Abdominal/pelvic CT scan dated December 07, 2008, shows atelectasis at  the lung bases bilaterally.  There was a hypervascular indeterminate  enhancing lesion at the right hepatic lobe.  Pelvic CT showed moderate  dilatation of the colon extending into the mid sigmoid where there is an  area of stricture versus neoplasm.  On prior examination, there was  colitis in this area.   ASSESSMENT AND PLAN:  1. Large bowel obstruction.  This appears somewhat subacute, secondary      to a stricture of the mid sigmoid colon. Etiology currently is      uncertain, and computerized tomography (CT) scan findings suggest      possible inflammatory benign stricture, based on prior computerized      tomography scan, which showed colitis in this area, versus      neoplastic.  We shall admit patient, commence bowel rest, continue      intravenous fluids, and request a Gastroenterology consultation for      a possible colonoscopy and biopsy.  A surgical consultation may be      indicated for definitive management.   1. Seizure disorder.  This appears controlled at present.  Patient's      last seizure was approximately 8 months ago.  We shall continue      Dilantin in pre-admission doses however, intravenously for now, as      patient is NPO.   1. History of anxiety/panic disorder.  The patient's psychotropics      will be on hold, as he is NPO. However, we shall utilize p.r.n.      Ativan.   1. Hypertension.  We shall manage this with a Clonidine patch for now.   Further management will all depend on clinical course.   Note: Patient  subsequently abscounded form the emergency department.      Isidor Holts, M.D.  Electronically Signed     CO/MEDQ  D:  12/07/2008  T:  12/07/2008  Job:  [id number]   cc:   Viviann Spare A. Waynette Buttery, M.D.  Fax: [phone]

## 2011-05-02 ENCOUNTER — Inpatient Hospital Stay: Payer: Medicaid Other | Admitting: Family Medicine

## 2011-05-04 NOTE — Discharge Summary (Signed)
[name], [name]                            ACCOUNT NO.:  [id number]   MEDICAL RECORD NO.:  [id number]                   PATIENT TYPE:  INP   LOCATION:  0443                                 FACILITY:  [facility]   PHYSICIAN:  Kela Millin, M.D.             DATE OF BIRTH:  September 26, 1956   DATE OF ADMISSION:  03/28/2004  DATE OF DISCHARGE:  04/01/2004                                 DISCHARGE SUMMARY   DISCHARGE DIAGNOSES:  1. Gastritis.  2. Supratherapeutic Dilantin level.  3. History of seizure disorder.  4. Internal hemorrhoids.  5. Rectosigmoid polyp.  6. History of depression.   PROCEDURE:  1. Colonoscopy with biopsies, March 31, 2004 per Dr. Loreta Ave.  2. EGD with biopsy, March 31, 2004 per Dr. Loreta Ave.   CONSULTATIONS:  Gastroenterology.   HISTORY:  The patient is a 55 year old male with a past medical history  significant for hypertension and seizure disorder who presented with a one  day history of left lower quadrant abdominal pain described as constant  cramping associated with some nausea and loss of appetite. He denied  vomiting, also denied diarrhea. He stated that the pain had been moderate  earlier but had decreased to mild to moderate at the time he was evaluated  in the ED. There were no known aggravating or alleviating factors and the  patient denied history of constipation, melena or bloody stools.  He also  denied frequency of urination, dysuria, fever, chills, chest pain, cough and  shortness of breath.   EXAM ON ADMISSION:  VITAL SIGNS:  Blood pressure 118/78, pulse 92,  temperature 98.3 and his O2 sat 98% on room air. Respiratory rate of 18.  GENERAL:  He was nontoxic looking.  NECK:  Supple with no JVD.  LUNGS:  Clear to auscultation.  CARDIAC:  Regular rate and rhythm without gallop or murmur.  ABDOMEN:  He had mild lower quadrant tenderness with guarding, no rebound.  Bowel sounds were present and were normal active.  EXTREMITIES:  No edema.  NEUROLOGIC:   He was alert and oriented with no focal deficits.   LABORATORY DATA:  His white cell count was 12.2, hemoglobin 16.3, hematocrit  46.2, MCV 96.4 with a platelet count of 240. The neutrophil count was 10.7,  sodium 135, potassium 3.6.  His chloride was 107 and his glucose 130, CO2 23  and BUN was 14.  His creatinine was 0.9, total bilirubin 0.6, alkaline  phosphatase 57 and his SGOT was 20, SGPT of 27. The total protein was 6.3,  albumin 3.8 and his calcium 8.6.  Amylase 19 and lipase of 14.  His Dilantin  level was 37.4 and his urinalysis showed urine with large leukocytes and 11-  20 WBC's.   The CT of the abdomen done on admission showed __________ and was started on  IV fluids as well as mild diffuse colitis involving the descending sigmoid  colon.  HOSPITAL COURSE:  The patient was admitted to the medical floor and was  started on IV fluids as well as antibiotics to cover for possible infectious  colitis as well as urinary tract infection. He was also started on proton  pump inhibitor as well as antiemetics with symptomatic management.  While in  the hospital, he had some episodes of hematemesis as well as bright red  blood per rectum and gastroenterology was consulted. The patient's H&H  remained stable throughout his hospital stay.  The patient was seen by Dr.  Loreta Ave, gastroenterologist, and a colonoscopy done on March 31, 2004 showed  prominent internal hemorrhoids, two small sessile polyps which were biopsied  from the rectosigmoid area and a normal appearing descending colon,  transverse colon as well as right colon and cecum.  There was no evidence of  colitis as indicated per CT scan on admission and the patient was continued  on stool softeners and a high fiber diet started. An EGD was also done on  March 31, 2004 and it showed diffuse gastritis with antral erythema and  biopsies for H. pylori were done and pending at the time of discharge.  The  small bowel appeared  atrophic per Dr. Loreta Ave and biopsies were done to rule  out sprue. The patient will be following up with Dr. Loreta Ave for biopsy results  following discharge. The patient received a full course of antibiotics for  the urinary tract infection and since colonoscopy did not show any colitis  per Dr. Loreta Ave no further antibiotics were recommended for that. The patient  improved clinically throughout his hospital stay with resolution of his  abdominal pain as well as nausea and vomiting.  He was tolerating p.o. well,  hemodynamically stable and afebrile prior to discharge.   The patient's Dilantin was held on admission due to the supratherapeutic  level and the patient did not have any seizure activity in the hospital.  His Dilantin level was rechecked prior to discharge and it was 17.3.  His  Dilantin dose was decreased from 300 to 200 mg q.h.s. on discharge and he is  to followup with his PCP.   The patient was maintained on Toprol in the hospital for his hypertension,  he was also maintained on doxepin and Klonopin for his depression/panic  disorder.   DISCHARGE DIET:  High fiber diet.   SPECIAL INSTRUCTIONS:  The patient is to avoid nonsteroidals,  antiinflammatories.   DISCHARGE MEDICATIONS:  1. Protonix 40 mg, 1 p.o. q.d.  2. Colace 200 mg q.h.s.  3. MiraLax 17 g in 8 ounces of fluid b.i.d. p.r.n. constipation.  4. Dilantin 200 mg q.h.s. until followup with PCP.  5. The patient to continue preadmission medications except for Dilantin dose     as indicated above.   FOLLOW UP:  1. Dr. Loreta Ave, gastroenterologist in two weeks.  2. Primary care physician in one week, followup Dilantin level and Dilantin     dose to be adjusted by PCP.                                               Kela Millin, M.D.    ACV/MEDQ  D:  04/28/2004  T:  04/28/2004  Job:  [id number]

## 2011-05-04 NOTE — Op Note (Signed)
NAMECHRISTYAN, [name]                            ACCOUNT NO.:  [id number]   MEDICAL RECORD NO.:  [id number]                   PATIENT TYPE:  INP   LOCATION:  0443                                 FACILITY[facility]  [facility]   PHYSICIAN:  Anselmo Rod, M.D.               DATE OF BIRTH:  03-01-1956   DATE OF PROCEDURE:  03/31/2004  DATE OF DISCHARGE:                                 OPERATIVE REPORT   PROCEDURE PERFORMED:  Colonoscopy with biopsies x 3.   ENDOSCOPIST:  Anselmo Rod, M.D.   INSTRUMENT USED:  Olympus video colonoscope.   INDICATIONS FOR PROCEDURE:  A 55 year old white male with a history of  mucoid rectal discharge and rectal bleeding with left lower quadrant pain,  rule out colitis, diverticulosis, masses, polyps, etc.   PREPROCEDURE PREPARATION:  Informed consent was procured from the patient.  The patient was fasted for 8 hours prior to the procedure and prepped with 2  gallons of GoLYTELY the night prior to the procedure along with a bottle of  magnesium citrate.   PREPROCEDURE PHYSICAL EXAMINATION:  VITAL SIGNS:  The patient with stable  vital signs.  NECK:  Supple.  CHEST:  Clear to auscultation.  CARDIAC:  S1, S2, regular.  ABDOMEN:  Soft with normal bowel sounds.   DESCRIPTION OF PROCEDURE:  The patient was placed in the left lateral  decubitus position, sedated with 100 mg of Demerol and 10 mg of Versed  intravenously in slow incremental doses.  Once the patient was adequately  sedated and maintained on low-flow oxygen and continuous cardiac monitoring,  the Olympus video colonoscope was advanced from the rectum to the cecum.  The appendiceal orifice and ileocecal valve were clearly visualized and  photographed.  Two small sessile polyps were biopsied x 3 from the  rectosigmoid area.  Prominent internal hemorrhoids were seen on  retroflexion.  The rest of the exam up to the cecum was normal.   IMPRESSION:  1. Prominent internal hemorrhoids.  2. Two small  sessile polyps, biopsied from the rectosigmoid area.  3. Normal-appearing descending colon, transverse colon, right colon, cecum.   RECOMMENDATIONS:  1. Continue a high fiber diet with liberal fluid intake.  2. Use Colace 1-2 q.h.s. as needed.  3. Avoid nonsteroidals.  4. Await pathology results.  5. Repeat _________ depending on pathology results.                                               Anselmo Rod, M.D.    JNM/MEDQ  D:  03/31/2004  T:  03/31/2004  Job:  [id number]   cc:   Kela Millin, M.D.

## 2011-05-04 NOTE — H&P (Signed)
[name], [name]                            ACCOUNT NO.:  [id number]   MEDICAL RECORD NO.:  [id number]                   PATIENT TYPE:  INP   LOCATION:  0443                                 [facility]ACILITY[facility]   PHYSICIAN:  Jackie Plum, M.D.             DATE OF BIRTH:  1956-09-26   DATE OF ADMISSION:  03/28/2004  DATE OF DISCHARGE:                                HISTORY & PHYSICAL   CHIEF COMPLAINT:  Abdominal pain.   HISTORY OF PRESENT ILLNESS:  The patient presents with 1-day history of left  lower quadrant abdominal pain which is described as constant cramping  associated with some nausea and loss of appetite, without vomiting or  diarrhea.  Pain was said to be moderate earlier, however, has gone down to  about mild to moderate at time of my evaluation in the ED.  No known  aggravating or alleviating factors.  He denies history of constipation,  black or bloody stools or melena, frequency of micturition, dysuria, fever,  chills, chest pain, cough, shortness of breath.  He has not had this pain  before.   PAST MEDICAL HISTORY:  Past medical history is significant for history of  hypertension and seizure disorders.  He also has a history of panic  disorder.   CURRENT MEDICINES:  Current medicines include:  1. Unknown dose of Doxepin.  2. Klonopin 2 mg p.o. b.i.d.  3. Dilantin 30 mg p.o. daily.  4. Toprol-XL -- dose unclear this point.   SOCIAL HISTORY:  The patient lives by himself at home.  His mom and dad live  close by and they check on him every now and then.  He has some cognitive  dysfunction.   PHYSICAL EXAMINATION:  VITAL SIGNS:  BP was 118/78, pulse of 92, temperature  98.3 degrees Fahrenheit, saturation of 98% on room air, respiratory rate of  18.  GENERAL EXAMINATION:  He was not toxic-looking.  HEENT:  Normocephalic, atraumatic.  Pupils were equal, round and reactive to  light.  Extraocular movements were intact.  NECK:  Neck was supple with no JVD.  LUNGS:  Lungs were clear to auscultation.  CARDIAC:  Exam was notable for regular rate and rhythm without any gallops  or murmur.  ABDOMEN:  Abdomen showed a mild left lower quadrant tenderness with  guarding, no rebound.  Bowel sounds were present; they were normoactive.  EXTREMITIES:  There was not noted to be any edema.  NEUROLOGIC:  On examination, he is alert and oriented, no acute focal  deficits.   LABORATORY WORK:  WBC count 12.2, hemoglobin 16.3, hematocrit 46.2, MCV  96.4, platelet count 240,000, absolute neutrophil count 10.5.  Sodium 135,  potassium 3.6, chloride 107, CO2 23, glucose 130, BUN 14, creatinine 0.9,  total bilirubin 0.6, alkaline phosphatase 57, SGOT 20, SGPT 27, total  protein 6.3, albumin 3.8, calcium 8.6, amylase 19, lipase 14.  Dilantin  level per  ED physician is still pending.  UA:  Turbid in color, large  leukocytes, many squamous epithelial cells, wbc's 11-20, a few bacteria.   ASSESSMENT:  [age]-year-old gentleman who presented with left lower  quadrant pain.  Abdominal CT with contrast shows colitis which is mildly  diffuse involving descending and sigmoid colon.   IMPRESSION:  [age]-year-old gentleman presenting with left lower  quadrant abdominal pain, nausea and decreased appetite, generalized weakness  and fatigue, leukocytosis with neutrophilia.  Impression is colitis,  inflammatory, doubt ischemic but could not rule out infectious etiology.   PLAN OF CARE:  The patient seems to be doing well, however, he said he feels  so weak that he is worried that he cannot take care of himself at home,  therefore, we will admit him to the hospital and start him on IV Cipro and  Flagyl with other supportive measures including antiemetics, IV fluids, etc.  Hopefully, he will be able to go home in about 48 hours.                                               Jackie Plum, M.D.    GO/MEDQ  D:  03/29/2004  T:  03/29/2004  Job:  [id number]   cc:    Molli Hazard A. Kathrynn Running, M.D.  [address] [facility]  [place]  [place] [address]  Fax: [phone]

## 2011-05-04 NOTE — Discharge Summary (Signed)
[facility]  Patient:    [name], [name] Visit Number: [id number] MRN: [id number]          Service Type: PSY Location: 400 0400 02 Attending Physician:  Jeanice Lim Dictated by:   Jeanice Lim, M.D. Admit Date:  12/15/2001 Discharge Date: 12/23/2001                             Discharge Summary  IDENTIFYING DATA:  This is a 55 year old single Caucasian, involuntarily committed, male following an intentional overdose.  PAST PSYCHIATRIC HISTORY:  The patient has had multiple psychiatric hospitalizations to other facilities and has a history of self-castration five years ago as well as multiple other suicide attempts.  The patient had been seen at [facility] and transferred for further stabilization of depression and monitoring of safety due to seriousness of overdose resulting in an ICU admission.  MEDICATIONS:  Sinequan 400 mg q.h.s., Dilantin 300 mg q.h.s., which patient has taken for 10 years.  ALLERGIES:  SULFA drugs.  PHYSICAL EXAMINATION:  Essentially within normal limits.  Neurologically nonfocal.  LABORATORY DATA:  Routine admission labs were within normal limits including a comprehensive metabolic panel, thyroid profile and Dilantin level was 21.7, slightly elevated, and repeat was 19.4, within therapeutic range.  Doxepin was also within therapeutic range of 176.  MENTAL STATUS EXAMINATION:  He was an alert, thin, pale, Caucasian male. Cooperative with fair eye contact.  Casually dressed.  Speech somewhat slow. Mood was angry and apathetic.  Affect was somewhat angry, suspicious.  Thought processes were goal directed.  Thought content positive for suicidal ideation as well as significant depressive symptoms and anger towards his family. Judgment and insight were considered fair at best.  ADMISSION DIAGNOSES: Axis I:    Major depression, recurrent, severe. Axis II:   None. Axis III:  1. Pneumonia.            2. Seizure  disorder. Axis IV:   Moderate (problems with primary support and other psychosocial            problems). Axis V:    20/55.  HOSPITAL COURSE:  The patient was ordered routine p.r.n.s and medications were continued from [facility] including amoxicillin for pneumonia, likely aspiration pneumonia, Xanax, Remeron, Sinequan, Humibid, Dilantin and Zyprexa. Lexapro was begun and Zyprexa optimized.  Sinequan was restarted at 200 mg and Zyprexa optimized.  Remeron, Xanax, Lexapro and Zyprexa were all titrated and Xanax was kept at a relatively low dose due to the patients history of becoming somewhat impulsive when on excessive Xanax.  Therefore, it was eventually scheduled at 0.25 mg b.i.d. p.r.n. anxiety and Restoril was discontinued.  The patient reported a positive response to medication changes, felt that he was doing much better and mood had improved.  He was more euthymic.  Affect brighter.  Thought processes goal directed.  Thought content negative for psychotic symptoms.  There was no longer suicidal or homicidal ideation.  Judgment and insight had improved and patient reported motivation to follow up with treatment plan and felt some sense of hope and sense of future.  CONDITION ON DISCHARGE:  Significantly improved.  DISCHARGE MEDICATIONS: 1. Neurontin 400 mg q.h.s., 900 mg q.a.m. and 3 p.m. 2. Trazodone 100 mg, 2 q.h.s. 3. Xanax 0.25 mg q.24h. p.r.n. for anxiety.  He was given #8 tabs. 4. Amoxicillin 500 mg, 2 q.a.m. and 2 q.h.s. x 2 days. 5. Dilantin 100 mg, 3 q.h.s.  6. Zyprexa 20 mg q.h.s. 7. Lexapro 20 mg q.a.m. 8. Doxepin 50 mg, 5 q.h.s.  DISCHARGE RECOMMENDATIONS:  The patient was advised not to drive sedated, return to the ER or [facility] if distress or side effects should occur and to follow up with [facility] on December 24, 2001 at 10:30 a.m.  DISCHARGE DIAGNOSES: Axis I:    Major depression, recurrent, severe. Axis II:   None. Axis  III:  1. Pneumonia.            2. Seizure disorder. Axis IV:   Moderate (problems with primary support and other psychosocial            problems). Axis V:    Global Assessment of Functioning on discharge 55. Dictated by:   Jeanice Lim, M.D. Attending Physician:  Jeanice Lim DD:  01/28/02 TD:  01/28/02 Job: [id number]

## 2011-05-04 NOTE — Discharge Summary (Signed)
NAMETYRELLE, RACZKA                            ACCOUNT NO.:  [id number]   MEDICAL RECORD NO.:  [id number]                   PATIENT TYPE:  IPS   LOCATION:  0401                                 FACILITY:  [facility]   PHYSICIAN:  Jeanice Lim, M.D.              DATE OF BIRTH:  [birth year]   DATE OF ADMISSION:  03/10/2003  DATE OF DISCHARGE:  03/15/2003                                 DISCHARGE SUMMARY   IDENTIFYING DATA:  This is a 55 year old single Caucasian male voluntarily  admitted presenting with a history of anxiety.  Off medications for two  days.  Took an extra doxepin, trying to make up for the two days.  Was on  quite a high dose of doxepin due to rapid metabolizing.  Levels and EKGs  have been closely monitored by outpatient psychiatrist.  However, patient  had stopped the medication abruptly and then restarted and even took extra.  The patient had had a syncopal episode and also described terror attacks,  feeling like he would try to kill himself rather than experience the terror.   MEDICATIONS:  Dilantin 300 mg q.a.m., doxepin 900 mg daily for six months  and Klonopin 2 mg q.a.m. and 4 mg q.h.s.   ALLERGIES:  SULFA medicines.   PHYSICAL EXAMINATION:  Essentially within normal limits.  Neurologically  nonfocal.   LABORATORY DATA:  CBC within normal limits.  Glucose 136.  Urine drug screen  positive for tricyclics.  The patient had not taken Klonopin or doxepin  while in the hospital and described severe agitation and panic.   MENTAL STATUS EXAM:  The patient was alert, having episodes of severe panic,  feeling that he had to try to kill himself rather than experience the panic.  Speech was otherwise within normal limits.  Mood anxious.  Affect  restricted.  Thought processes goal directed.  Thought content negative for  dangerous ideation or psychotic symptoms.   ADMISSION DIAGNOSES:   AXIS I:  1. Panic disorder without agoraphobia.  2. Depression not otherwise  specified.   AXIS II:  None.   AXIS III:  1. Hypertension.  2. History of seizure disorder.   AXIS IV:  Moderate (problems with primary support group).   AXIS V:  30/65.   HOSPITAL COURSE:  The patient was admitted and ordered routine p.r.n.  medications and underwent further monitoring.  Was encouraged to participate  in individual, group and milieu therapy.  The patient required Ativan p.r.n.  initially until he was restarted on the Klonopin, which he had been taking  for control of his seizures in the past along with Dilantin.  This was at a  decreased dose.  Previously taking 6 mg a day.  It was decreased to 4 mg a  day and restabilized on doxepin gradually.  EKG and doxepin levels were  checked and patient reported a positive response to clinical intervention  with resolution of panic attacks, stable mood.  No suicidal ideation or  homicidal ideation.  No psychotic symptoms.  Judgment and insight improved  and he recognized the importance of compliance with his medications and  dangerousness of abruptly restarting or stopping his medications.   DISCHARGE MEDICATIONS:  1. Dilantin 100 mg, 3 q.h.s.  2. Sinequan 150 mg q.a.m., 3 p.m. and 4 q.h.s.  3. Ambien 10 mg q.h.s. p.r.n.  4. Klonopin 2 mg b.i.d. (decreased from his previous dose of 6 mg in a day,     reportedly for treatment of a seizure disorder).  5. Seroquel 100 mg q.8h. p.r.n. severe agitation or panic.   FOLLOW UP:  The patient was to follow up with Dr. Ilsa Iha.  A copy of the  labs, including doxepin levels and EKG, were to be sent to his outpatient  psychiatrist and he had an appointment made on March 25, 2003 at 11:30 a.m.   DISCHARGE DIAGNOSES:   AXIS I:  1. Panic disorder without agoraphobia.  2. Depression not otherwise specified.   AXIS II:  None.   AXIS III:  1. Hypertension.  2. History of seizure disorder.   AXIS IV:  Moderate (problems with primary support group).   AXIS V:  Global Assessment  of Functioning on discharge 55.                                               Jeanice Lim, M.D.    JEM/MEDQ  D:  03/24/2003  T:  03/24/2003  Job:  [id number]

## 2011-05-04 NOTE — Op Note (Signed)
[name], [name]                            ACCOUNT NO.:  [id number]   MEDICAL RECORD NO.:  [id number]                   PATIENT TYPE:  INP   LOCATION:  0443                                 FACILITY:  [name]   PHYSICIAN:  Anselmo Rod, M.D.               DATE OF BIRTH:  30-Mar-1956   DATE OF PROCEDURE:  03/31/2004  DATE OF DISCHARGE:                                 OPERATIVE REPORT   PROCEDURE PERFORMED:  Esophagogastroduodenoscopy with multiple biopsies.   ENDOSCOPIST:  Anselmo Rod, M.D.   INSTRUMENT USED:  Olympus video panendoscope.   INDICATION FOR PROCEDURE:  A 55 year old white male with a history of  questionable hematemesis, blood in stool, rule out Mallory-Weiss tear, AVMs,  etc.   PREPROCEDURE PREPARATION:  Informed consent was procured from the patient.  The patient was fasted for 8 hours prior to the procedure.   PREPROCEDURE PHYSICAL EXAMINATION:  VITAL SIGNS:  The patient with stable  vital signs.  NECK:  Supple.  CHEST:  Clear to auscultation.  CARDIAC:  S1, S2, regular.  ABDOMEN:  Soft with normal bowel sounds.   DESCRIPTION OF PROCEDURE:  The patient was placed in the left lateral  decubitus position, sedated with Demerol and Versed for the colonoscopy.  Additional 5 mg of Versed was used for the EGD.  Once the patient was  adequately sedated and maintained on low-flow oxygen and continuous cardiac  monitoring, the Olympus video panendoscope was advanced through the mouth  piece, over the tongue, into the esophagus under direct vision.  The entire  esophagus appeared normal with no evidence of ring, stricture, masses,  esophagitis, or Barrett's mucosa.  The scope was then advanced into the  stomach.  There was diffuse gastritis with antral erythema.  Biopsies were  done to rule out presence of H. pylori by pathology.  The small bowel  appeared atrophic, and distal random small bowel biopsies were done to rule  out sprue.  There was no outlet  obstruction.  Retroflexion in the high  cardia revealed __________.  The patient tolerated the procedure well  without immediate complications.   IMPRESSION:  1. Normal-appearing esophagus.  2. Diffuse gastritis with antral erythema, biopsies done for H. pylori,     results pending.  3. Atrophic-appearing small bowel.  Biopsies done to rule out sprue.   RECOMMENDATIONS:  1. Await pathology results.  2. Avoid nonsteroidals including aspirin for now.  3. Change Protonix to p.o..  4. Treat with antibiotics if H. pylori present on biopsies.  5. Outpatient follow up in the next 2 weeks for further recommendations.                                               Anselmo Rod, M.D.  JNM/MEDQ  D:  03/31/2004  T:  03/31/2004  Job:  [id number]   cc:   Kela Millin, M.D.

## 2011-05-04 NOTE — Consult Note (Signed)
[name], [name]                            ACCOUNT NO.:  [id number]   MEDICAL RECORD NO.:  [id number]                   PATIENT TYPE:  INP   LOCATION:  0443                                 FACILITY:  [facility]   PHYSICIAN:  Anselmo Rod, M.D.               DATE OF BIRTH:  1955-12-26   DATE OF CONSULTATION:  03/30/2004  DATE OF DISCHARGE:                                   CONSULTATION   REASON FOR CONSULTATION:  Left lower quadrant pain and chronic constipation.   ASSESSMENT:  1. Left lower quadrant pain with chronic constipation probably secondary to     medications.  2. Blood in the stool with hard bowel movements rule out colonic polyps,     hemorrhoids, fissures,  arteriovenous malformations, etc.  3. Hypertension.  4. Depression with a history of panic disorders.  5. Seizure disorder.  6. Disability secondary to the above-mentioned medical problems.   RECOMMENDATIONS:  1. EGD and colonoscopy in the a.m.  2. Will prep with GoLYTELY tonight.   DISCUSSION:  [name] is a white male admitted to [facility]  by Dr. Jackie Plum on March 29, 2004 for left lower quadrant pain and  discomfort associated with some blood in stools and nausea and vomiting.  The patient was started on Cipro and Flagyl which helped him through his  symptoms.  He says his symptoms have now improved considerably.  However, he  has had bloody mucoid discharge and left lower quadrant pain that seems to  be unrelenting.  He had some nausea and vomiting on admission and noted some  dark stool followed by some fresh blood, but according to the patient's  nurse this may have been a cherry drink he consumed prior to vomiting on  admission.  He denies history of fever, chills, rigors, also jaundice or  colitis.  There is no history of dysphagia, odynophagia, reflux disease or  diarrhea alternating with bouts of constipation.  He has had intermittent  hematochezia which he attributes to  rectal irritation from hard stool for  several years.  He denies having had a colonoscopy in the past.  There is no  history of anemia or blood transfusions.   PAST MEDICAL HISTORY:  See list above.   ALLERGIES:  The patient had no known drug allergies.   MEDICATIONS DURING THIS HOSPITALIZATION:  Klonopin, Sinequan, Toprol-XL,  docusate sodium p.r.n., ciprofloxacin 400 mg IV q.12, h., Flagyl 500 mg IV  q.8 h., Tylenol p.r.n., Phenergan p.r.n., and Ambien p.r.n.  He is also on  Maalox suspension as needed.   SOCIAL HISTORY:  He is single.  He has never been married.  He has no  children.  He denies the use of alcohol or tobacco or drugs.  He is  disabled.  He has a [employer] degree in biology but had never worked since  he graduated from college because  of disability secondary to depression.   FAMILY HISTORY:  His parents are both elderly and are doing fairly well  except for problems with arthritis.  He denies a family history of colon,  breast, or prostate cancer.  There is no family history of diabetes or  hypertension.   REVIEW OF SYSTEMS:  1. Left lower quadrant pain.  2. Chronic constipation.  3. Intermittent bloody mucoid discharge.  4. Depression and panic disorder.  5. Seizure disorder.   PHYSICAL EXAMINATION:  GENERAL:  A pleasant, cooperative, middle-aged white  male in no acute distress with stable vital signs.  VITAL SIGNS:  Temperature is 98.4, blood pressure 130/80, pulse 78 per  minute, respiratory rate 18.  HEENT:  TMs clear, PERRLA.  __________.  Oropharyngeal mucosa without  exudate.  NECK:  Supple.  No JVD, thyromegaly or lymphadenopathy.  CHEST:  Clear to auscultation.  S1 & S2 regular.  No murmur, rub, or gallop.  LUNGS:  No rhonchi or wheezing.  ABDOMEN:  Soft, obese with good bowel sounds.  Has left lower quadrant  tenderness on deep palpation with no guarding, no rebound, no rigidity, no  hepatosplenomegaly, no masses palpable.  RECTAL:  Deferred as  the patient was in the process of consuming his  GoLYTELY and prepping for the procedure.   LABORATORY EVALUATION:  Revealed a hemoglobin of 16.3 on admission with an  MCV of 96.4.  White count was slightly elevated at 12.2 with 86% neutrophils  on the differential.  Sodium 135, potassium 3.6, chloride 107, CO2 23,  glucose 130, BUN 14, creatinine 0.9, calcium 8.6, total protein 6.3, albumin  3.8, AST 20, ALT 27, alkaline phosphatase 67, total bili 0.6, lipase 24.  Dilantin level was at a panic high of 37.4.  Amylase was low at 19.  Urinalysis showed 11-20 white blood cells with many epithelial cells and a  few bacteria.  There was a large amount of leukocyte esterase in the urine.  CT scan of the abdomen showed mild diffuse descending and sigmoid colon  inflammation rule out ischemic versus inflammatory condition.   Considering the above data, recommendations have been made for the  recommendation of admit and follow up.                                               Anselmo Rod, M.D.    JNM/MEDQ  D:  03/30/2004  T:  03/30/2004  Job:  [id number]   cc:   Viviann Spare A. Waynette Buttery, M.D.  Fam. Med - Resident - [place], [place] [address]  Fax: [phone]   Artist Pais. Kathrynn Running, M.D.  [address] [facility]  [place]  [place] [address]  Fax: [phone]

## 2011-05-04 NOTE — H&P (Signed)
[facility]  Patient:    [name], [name] Visit Number: [id number] MRN: [id number]          Service Type: PSY Location: 400 0400 01 Attending Physician:  Jeanice Lim Dictated by:   Candi Leash. Orsini, N.P. Admit Date:  12/15/2001                     Psychiatric Admission Assessment  IDENTIFYING INFORMATION:  A 55 year old single white involuntarily committed on December 15, 2001, for intentional overdose.  HISTORY OF PRESENT ILLNESS:  The patient presents on commitment papers that report that patient has had a long history of depression and has had a recent medication change which decreased his mood and increased his agitation.  The patient was having positive suicidal thoughts.  The patient was a transfer from [facility] where patient overdosed on an unknown amount of his tricyclics on December 11, 2001.  The patient denies he overdosed during the interview.  He states he had put a garbage over his head and tied it up.  The patient was found by his father.  The patient reports that he was called to heaven and states "life is hell here on earth."  The patient has also been buying books about suicide.  His sleep has been poor.  His appetite has decreased with a 20 pound weight loss over the past 4 to 5 months.  He denies any psychotic symptoms, no paranoia.  He reports that he will contract for safety at this time.  He is concerned about his medication but is willing to try an antidepressant.  PAST PSYCHIATRIC HISTORY:  First hospitalization to [facility], multiple admissions to other facilities.  He has a history of self castration 5 years ago, with other multiple suicide attempts by cutting and overdosing.  SOCIAL HISTORY:  He is a 55 year old single white male with no children.  He lives alone.  He is on disability for his psychiatric illness.  He has completed his Masters.  He has no legal problems.  His brother died  in 2001-12-15.  He did not go to the funeral and stated his brother hated him.  FAMILY HISTORY:  Brother with alcohol issues.  ALCOHOL DRUG HISTORY:  Nonsmoker, nondrinker, denies any substance abuse.  PAST MEDICAL HISTORY:  Primary care Raphaela Cannaday is none.  Medical problems are aspiration pneumonia, seizure disorder, last seizure was 20 years ago.  MEDICATIONS:  Sinequan 400 mg, Dilantin 300 mg q.h.s., has been on it for 10 years.  DRUG ALLERGIES:  SULFA.  PHYSICAL EXAMINATION:  Was performed at [facility] during his 4-day hospital stay.  Urine drug screen was positive for benzodiazepines, positive for tricyclics.  EKG was normal sinus rhythm and prolonged qT.  MENTAL STATUS EXAMINATION:  He is an alert, thin, pale Caucasian male, cooperative, with fair eye contact.  Casually dressed.  Speech is normal and relevant.  Mood is angry and apathetic.  Affect is angry, suspicious and apathetic.  Thought processes are positive suicidal ideation.  Patient will contract for safety.  Denies any auditory or visual hallucinations, no paranoia.  Patient is disoriented to date but corrects the answer with prompting, decreased focusing.  Reliability is questionable.  The patient contradicts himself.  ADMISSION DIAGNOSES: Axis I:    Major depression, recurrent, severe. Axis II:   Deferred. Axis III:  Pneumonia and seizure disorder. Axis IV:   Problems with primary support group and other psychosocial problems Axis V:  Current 20, estimated this past year 55-60.  INITIAL PLAN OF CARE:  Involuntary commitment to [facility] for intentional overdose.  Contract for safety, check every 15 minutes.  Patient is on locked hall.  Will continue with his discharge medications.  Will obtain further labs.  Will add an antidepressant to decrease depressive symptoms. Dr. Kathrynn Running was consulted in regards to his treatment plan.  Our goal is to stabilize his mood and thinking so patient can be  safe, for patient to be medication compliant.  TENTATIVE LENGTH OF STAY:  4 to 5 days. Dictated by:   Candi Leash. Orsini, N.P. Attending Physician:  Jeanice Lim DD:  12/16/01 TD:  12/16/01 Job: [id number]
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
## 2011-05-17 ENCOUNTER — Other Ambulatory Visit: Payer: Self-pay | Admitting: Family Medicine

## 2011-05-17 NOTE — Telephone Encounter (Signed)
Refill request

## 2011-07-11 ENCOUNTER — Ambulatory Visit: Payer: Medicaid Other | Attending: Orthopedic Surgery | Admitting: Rehabilitation

## 2011-07-11 DIAGNOSIS — R293 Abnormal posture: Secondary | ICD-10-CM | POA: Insufficient documentation

## 2011-07-11 DIAGNOSIS — IMO0001 Reserved for inherently not codable concepts without codable children: Secondary | ICD-10-CM | POA: Insufficient documentation

## 2011-07-11 DIAGNOSIS — M25619 Stiffness of unspecified shoulder, not elsewhere classified: Secondary | ICD-10-CM | POA: Insufficient documentation

## 2011-07-11 DIAGNOSIS — M25519 Pain in unspecified shoulder: Secondary | ICD-10-CM | POA: Insufficient documentation

## 2011-07-19 ENCOUNTER — Telehealth: Payer: Self-pay | Admitting: *Deleted

## 2011-07-19 NOTE — Telephone Encounter (Signed)
Patient in office stating he needs refill on clonazepam. He has enough to last 10 days but he is going out of town early next week and needs this before he leaves. We received refill from pharmacy yesterday. Advised will send message to MD and try to get this done by end of day tomorrow. Pharmacy is [facility],  [address] , [facility] store # [phone]

## 2011-07-20 ENCOUNTER — Other Ambulatory Visit: Payer: Self-pay | Admitting: *Deleted

## 2011-07-20 DIAGNOSIS — R569 Unspecified convulsions: Secondary | ICD-10-CM

## 2011-07-20 MED ORDER — CLONAZEPAM 2 MG PO TABS: ORAL_TABLET | ORAL | Status: DC

## 2011-07-20 NOTE — Telephone Encounter (Signed)
Paged Dr. Denyse Amass and he advises may call in for patient.

## 2011-07-20 NOTE — Telephone Encounter (Signed)
Paged Dr. Denyse Amass and he advises may call in Clonazepam for patient.

## 2011-08-08 ENCOUNTER — Ambulatory Visit: Payer: Medicaid Other | Attending: Orthopedic Surgery | Admitting: Physical Therapy

## 2011-08-08 DIAGNOSIS — R293 Abnormal posture: Secondary | ICD-10-CM | POA: Insufficient documentation

## 2011-08-08 DIAGNOSIS — M25619 Stiffness of unspecified shoulder, not elsewhere classified: Secondary | ICD-10-CM | POA: Insufficient documentation

## 2011-08-08 DIAGNOSIS — M25519 Pain in unspecified shoulder: Secondary | ICD-10-CM | POA: Insufficient documentation

## 2011-08-08 DIAGNOSIS — IMO0001 Reserved for inherently not codable concepts without codable children: Secondary | ICD-10-CM | POA: Insufficient documentation

## 2011-08-10 ENCOUNTER — Encounter: Payer: Self-pay | Admitting: Family Medicine

## 2011-08-10 ENCOUNTER — Ambulatory Visit (INDEPENDENT_AMBULATORY_CARE_PROVIDER_SITE_OTHER): Payer: Medicaid Other | Admitting: Family Medicine

## 2011-08-10 ENCOUNTER — Ambulatory Visit: Payer: Medicaid Other | Admitting: Family Medicine

## 2011-08-10 VITALS — BP 85/62 | HR 67 | Temp 97.7°F | Ht 71.0 in | Wt 176.0 lb

## 2011-08-10 DIAGNOSIS — R569 Unspecified convulsions: Secondary | ICD-10-CM

## 2011-08-10 DIAGNOSIS — I10 Essential (primary) hypertension: Secondary | ICD-10-CM

## 2011-08-10 DIAGNOSIS — Z8669 Personal history of other diseases of the nervous system and sense organs: Secondary | ICD-10-CM

## 2011-08-10 MED ORDER — CLONAZEPAM 2 MG PO TABS: ORAL_TABLET | ORAL | Status: DC

## 2011-08-10 NOTE — Progress Notes (Signed)
  Subjective:    Patient ID: [name], male    DOB: 1956/08/14, 55 y.o.   MRN: [id number]  HPI Here to refill medications- mainly his clonazepam  HYPERTENSION BP low today.  Patient states it is always low and he titrated his BP to his afternoon BP's due to taking lots of sedatives at night.  Reports many years of same antihypertensive regimen.  No orthostasis. BP Readings from Last 3 Encounters:  08/10/11 85/62  10/27/10 103/72  10/26/10 110/74    Hypertension ROS: taking medications as instructed, no medication side effects noted, home BP monitoring in range of 130's systolic over 80's diastolic, no TIA's, no chest pain on exertion, no dyspnea on exertion, no swelling of ankles and no intermittent claudication symptoms.  Seizure DO: no recent seizure.  Reports medications are clonazepam and dilantin.  Managed by  His PCP.  Dilantin level noted 3.5 months ago. Lab Results  Component Value Date   PHENYTOIN 18.7 04/18/2011      Review of Systems See hpi    Objective:   Physical Exam  GEN: Alert & Oriented, No acute distress CV:  Regular Rate & Rhythm, no murmur Respiratory:  Normal work of breathing, CTAB Abd:  + BS, soft, no tenderness to palpation Ext: no pre-tibial edema       Assessment & Plan:  Patient due for fasting labwork, PSA, and testosterone.  He will come fasting to his physical appt with PCP and will draw at that time.

## 2011-08-10 NOTE — Assessment & Plan Note (Signed)
I gave him a paper rx for clonazepam to fill on Sept 1st for ease of remembering date, last one filled 8/3.

## 2011-08-10 NOTE — Assessment & Plan Note (Signed)
Did not make changes to regimen today despite hypotension as patient states has been on same regimen and monitors BP's carefully at home- noting lows in the morning due to high doses of psychotropic medications at night.

## 2011-08-10 NOTE — Patient Instructions (Signed)
I gave you a prescription for your klonopin to fill on the first of the first It is important to keep your appointment for Dr. Denyse Amass for your physical. Come fasting, and we will check your labs for diabetes and cholesterol then.

## 2011-08-17 ENCOUNTER — Other Ambulatory Visit: Payer: Self-pay | Admitting: Family Medicine

## 2011-08-17 NOTE — Telephone Encounter (Signed)
Refill request

## 2011-08-21 ENCOUNTER — Other Ambulatory Visit: Payer: Self-pay | Admitting: Family Medicine

## 2011-08-21 DIAGNOSIS — R569 Unspecified convulsions: Secondary | ICD-10-CM

## 2011-08-21 MED ORDER — TESTOSTERONE CYPIONATE 100 MG/ML IM SOLN: 400.0000 mg | INTRAMUSCULAR | Status: DC

## 2011-08-21 MED ORDER — CLONAZEPAM 2 MG PO TABS: ORAL_TABLET | ORAL | Status: DC

## 2011-09-07 ENCOUNTER — Encounter: Payer: Self-pay | Admitting: Family Medicine

## 2011-09-07 ENCOUNTER — Ambulatory Visit (INDEPENDENT_AMBULATORY_CARE_PROVIDER_SITE_OTHER): Payer: Medicaid Other | Admitting: Family Medicine

## 2011-09-07 VITALS — BP 110/74 | HR 93 | Temp 98.8°F | Ht 70.0 in | Wt 179.2 lb

## 2011-09-07 DIAGNOSIS — Z0000 Encounter for general adult medical examination without abnormal findings: Secondary | ICD-10-CM

## 2011-09-07 DIAGNOSIS — I10 Essential (primary) hypertension: Secondary | ICD-10-CM

## 2011-09-07 DIAGNOSIS — Z23 Encounter for immunization: Secondary | ICD-10-CM

## 2011-09-07 DIAGNOSIS — R569 Unspecified convulsions: Secondary | ICD-10-CM

## 2011-09-07 DIAGNOSIS — E291 Testicular hypofunction: Secondary | ICD-10-CM

## 2011-09-07 DIAGNOSIS — E785 Hyperlipidemia, unspecified: Secondary | ICD-10-CM

## 2011-09-07 DIAGNOSIS — K5909 Other constipation: Secondary | ICD-10-CM

## 2011-09-07 MED ORDER — CLONAZEPAM 2 MG PO TABS: ORAL_TABLET | ORAL | Status: DC

## 2011-09-07 MED ORDER — POLYETHYLENE GLYCOL 3350 17 G PO PACK: 17.0000 g | PACK | Freq: Every day | ORAL | Status: DC

## 2011-09-07 MED ORDER — TESTOSTERONE CYPIONATE 100 MG/ML IM SOLN: 400.0000 mg | INTRAMUSCULAR | Status: DC

## 2011-09-07 NOTE — Assessment & Plan Note (Signed)
Doing well today. Health concerns are.Marland KitchenMarland Kitchen 1) cancer screening Will repeat PSA 2) lipids will get fasting lipid panel after September 21, 2011 3) followup elevated glucose. We'll obtain comprehensive metabolic panel fasting. 4) mental health recommended close followup medications updated. 5) constipation refilled MiraLAX recommended daily use 6) hypogonadal: Refill testosterone 7) seizure disorder refill Klonopin we'll refill phenytoin when needed.  Discussed risks of Wellbutrin while on phenytoin  Followup in 6 months

## 2011-09-07 NOTE — Progress Notes (Signed)
Mr. Artman presents to clinic today for a physical exam. Since our last visit he suffered a fall where he had a proximal humeral fracture and rotator cuff tear.  He had reconstructive surgery in May. Since then he's noted weakness with arm abduction and forward flexion. He continues his home exercises with range of motion and stretching.  Otherwise his other main health concern is depression. He sees a psychiatrist who is prescribing Seroquel, doxepin, and Wellbutrin. He denies any death wish and notes that his mood is up and down. Additionally he suffers from constipation for which she takes MiraLAX  intermittently area and also he has hypogonadism following a self orchectomy in the 22s. He takes testosterone  injection and is doing well.  Screening: 1) colon cancer He's had colon cancer screening via colonoscopy one year ago. He noted polyps requested followup in 3 years. 2) prostate cancer: PSAs have been within range for multiple years and route however he is on  testosterone he would like repeat PSA.  Health maintenance: Last lipid panel was in October of 2011 and normal.  Last metabolic panel was in May and was normal with the exception of elevated glucose this is a nonfasting study.  PMH reviewed.  ROS as above otherwise neg Medications reviewed.  Exam:  BP 110/74  Pulse 93  Temp(Src) 98.8 F (37.1 C) (Oral)  Ht 5\' 10"  (1.778 m)  Wt 179 lb 4 oz (81.307 kg)  BMI 25.72 kg/m2 Gen: Well NAD HEENT: EOMI,  MMM Lungs: CTABL Nl WOB Heart: RRR no MRG Abd: NABS, NT, ND Exts: Non edematous BL  LE, warm and well perfused.

## 2011-09-07 NOTE — Patient Instructions (Signed)
Thank you for coming in today. I think you are doing well.  Come back after Oct 5th fasting for blood work.  If everything checks out we should see each other in 6 months. I refilled your testosterone and Klonopin today. Please see your psychiatrist frequently.

## 2011-09-17 ENCOUNTER — Other Ambulatory Visit: Payer: Self-pay | Admitting: Family Medicine

## 2011-09-17 NOTE — Telephone Encounter (Signed)
Refill request

## 2011-09-21 ENCOUNTER — Other Ambulatory Visit: Payer: Medicaid Other

## 2011-09-21 LAB — RAPID URINE DRUG SCREEN, HOSP PERFORMED
Amphetamines: NOT DETECTED
Barbiturates: NOT DETECTED
Benzodiazepines: NOT DETECTED
Cocaine: NOT DETECTED
Opiates: NOT DETECTED
Tetrahydrocannabinol: NOT DETECTED

## 2011-09-21 LAB — COMPREHENSIVE METABOLIC PANEL
ALT: 34 U/L (ref 0–53)
AST: 33 U/L (ref 0–37)
Albumin: 4.3 g/dL (ref 3.5–5.2)
Alkaline Phosphatase: 89 U/L (ref 39–117)
BUN: 13 mg/dL (ref 6–23)
CO2: 29 mEq/L (ref 19–32)
Calcium: 9.8 mg/dL (ref 8.4–10.5)
Chloride: 96 mEq/L (ref 96–112)
Creatinine, Ser: 1.31 mg/dL (ref 0.4–1.5)
GFR calc Af Amer: >60 mL/min (ref >60)
GFR calc non Af Amer: 57 mL/min — ABNORMAL LOW (ref >60)
Glucose, Bld: 130 mg/dL — ABNORMAL HIGH (ref 70–99)
Potassium: 3.8 mEq/L (ref 3.5–5.1)
Sodium: 135 mEq/L (ref 135–145)
Total Bilirubin: 0.8 mg/dL (ref 0.3–1.2)
Total Protein: 7 g/dL (ref 6.0–8.3)

## 2011-09-21 LAB — DIFFERENTIAL
Basophils Absolute: 0 10*3/uL (ref 0.0–0.1)
Basophils Relative: 0 % (ref 0–1)
Eosinophils Absolute: 0 10*3/uL (ref 0.0–0.7)
Eosinophils Relative: 0 % (ref 0–5)
Lymphocytes Relative: 8 % — ABNORMAL LOW (ref 12–46)
Lymphs Abs: 1 10*3/uL (ref 0.7–4.0)
Monocytes Absolute: 0.5 10*3/uL (ref 0.1–1.0)
Monocytes Relative: 4 % (ref 3–12)
Neutro Abs: 11.1 10*3/uL — ABNORMAL HIGH (ref 1.7–7.7)
Neutrophils Relative %: 88 % — ABNORMAL HIGH (ref 43–77)

## 2011-09-21 LAB — URINALYSIS, ROUTINE W REFLEX MICROSCOPIC
Bilirubin Urine: NEGATIVE
Glucose, UA: NEGATIVE mg/dL
Hgb urine dipstick: NEGATIVE
Ketones, ur: NEGATIVE mg/dL
Nitrite: NEGATIVE
Protein, ur: NEGATIVE mg/dL
Specific Gravity, Urine: 1.008 (ref 1.005–1.030)
Urobilinogen, UA: 0.2 mg/dL (ref 0.0–1.0)
pH: 7.5 (ref 5.0–8.0)

## 2011-09-21 LAB — CBC
HCT: 48.9 % (ref 39.0–52.0)
Hemoglobin: 16.4 g/dL (ref 13.0–17.0)
MCHC: 33.6 g/dL (ref 30.0–36.0)
MCV: 100.5 fL — ABNORMAL HIGH (ref 78.0–100.0)
Platelets: 221 10*3/uL (ref 150–400)
RBC: 4.86 MIL/uL (ref 4.22–5.81)
RDW: 14.1 % (ref 11.5–15.5)
WBC: 12.6 10*3/uL — ABNORMAL HIGH (ref 4.0–10.5)

## 2011-09-21 LAB — URINE MICROSCOPIC-ADD ON

## 2011-09-21 LAB — LIPASE, BLOOD: Lipase: 16 U/L (ref 11–59)

## 2011-10-02 LAB — PHENYTOIN LEVEL, TOTAL: Phenytoin Lvl: 11.6

## 2011-10-02 LAB — I-STAT 8, (EC8 V) (CONVERTED LAB)
Acid-Base Excess: 1
BUN: 17
Bicarbonate: 26.4 — ABNORMAL HIGH
Chloride: 106
Glucose, Bld: 104 — ABNORMAL HIGH
HCT: 46
Hemoglobin: 15.6
Operator id: 270961
Potassium: 4.5
Sodium: 139
TCO2: 28
pCO2, Ven: 45.1
pH, Ven: 7.375 — ABNORMAL HIGH

## 2011-10-02 LAB — POCT I-STAT CREATININE
Creatinine, Ser: 1.1
Operator id: 270961

## 2011-10-24 ENCOUNTER — Other Ambulatory Visit: Payer: Self-pay | Admitting: Family Medicine

## 2011-10-24 MED ORDER — TESTOSTERONE CYPIONATE 100 MG/ML IM SOLN: 400.0000 mg | INTRAMUSCULAR | Status: DC

## 2011-11-07 ENCOUNTER — Other Ambulatory Visit: Payer: Medicaid Other

## 2011-11-15 ENCOUNTER — Other Ambulatory Visit: Payer: Medicaid Other

## 2012-01-03 ENCOUNTER — Emergency Department (HOSPITAL_COMMUNITY)
Admission: EM | Admit: 2012-01-03 | Discharge: 2012-01-03 | Disposition: A | Discharge status: Home / Self Care | Payer: Medicaid Other | Attending: Emergency Medicine | Admitting: Emergency Medicine

## 2012-01-03 ENCOUNTER — Encounter (HOSPITAL_COMMUNITY): Payer: Self-pay | Admitting: *Deleted

## 2012-01-03 DIAGNOSIS — S0510XA Contusion of eyeball and orbital tissues, unspecified eye, initial encounter: Secondary | ICD-10-CM | POA: Insufficient documentation

## 2012-01-03 DIAGNOSIS — G40909 Epilepsy, unspecified, not intractable, without status epilepticus: Secondary | ICD-10-CM | POA: Insufficient documentation

## 2012-01-03 DIAGNOSIS — F3289 Other specified depressive episodes: Secondary | ICD-10-CM | POA: Insufficient documentation

## 2012-01-03 DIAGNOSIS — R404 Transient alteration of awareness: Secondary | ICD-10-CM | POA: Insufficient documentation

## 2012-01-03 DIAGNOSIS — W19XXXA Unspecified fall, initial encounter: Secondary | ICD-10-CM | POA: Insufficient documentation

## 2012-01-03 DIAGNOSIS — F329 Major depressive disorder, single episode, unspecified: Secondary | ICD-10-CM | POA: Insufficient documentation

## 2012-01-03 DIAGNOSIS — S0990XA Unspecified injury of head, initial encounter: Secondary | ICD-10-CM | POA: Insufficient documentation

## 2012-01-03 DIAGNOSIS — Z79899 Other long term (current) drug therapy: Secondary | ICD-10-CM | POA: Insufficient documentation

## 2012-01-03 NOTE — ED Provider Notes (Signed)
History     CSN: [id number]  Arrival date & time 01/03/12  1602   First MD Initiated Contact with Patient 01/03/12 1618      Chief Complaint  Patient presents with  . Fall    pt fell last night around 9pm reports positive LOC, does not remember falling. pt hit right eye. pt reports recent stressors in life. pt denies complaints of pain at this time.     (Consider location/radiation/quality/duration/timing/severity/associated sxs/prior treatment) Patient is a 56 y.o. male presenting with fall. The history is provided by the patient.  Fall Pertinent negatives include no headaches.   the patient is a 56 year old, male, with a depression, and schizoaffective disorder, and seizures, who was sent to the emergency department for evaluation by the social worker because he had fallen yesterday and hit his head.  He says that he tripped and fell.  He did not have loss of consciousness.  He denies a headache.  He denies vision changes.  He denies nausea, vomiting, neck pain, or weakness.  Is not taking any anticoagulants.  He is asymptomatic now.  Past Medical History  Diagnosis Date  . Schizoaffective disorder   . Depression   . Seizures     Past Surgical History  Procedure Date  . Self orchectomy   . Colonoscopy 2010    Family History  Problem Relation Age of Onset  . Stroke Father     History  Substance Use Topics  . Smoking status: Never Smoker   . Smokeless tobacco: Not on file  . Alcohol Use: No      Review of Systems  HENT: Negative for neck pain.   Eyes: Negative for visual disturbance.  Cardiovascular: Negative for chest pain.  Musculoskeletal: Negative for back pain.  Neurological: Positive for seizures. Negative for dizziness, syncope and headaches.  Psychiatric/Behavioral: Negative for confusion.  All other systems reviewed and are negative.    Allergies  Sulfonamide derivatives  Home Medications   Current Outpatient Rx  Name Route Sig Dispense Refill   . BUPROPION HCL ER (XL) 150 MG PO TB24 Oral Take 150 mg by mouth daily.      Marland Kitchen CALCIUM CARBONATE-VITAMIN D 500-200 MG-UNIT PO TABS Oral Take 1 tablet by mouth daily.      Marland Kitchen CLONAZEPAM 2 MG PO TABS  Take 2 & 1/2 tabs by mouth twice a day.  Fill on or after 08/18/11 150 tablet 5  . DILANTIN INFATABS 50 MG PO CHEW  TAKE 4 TABLETS BY MOUTH EVERY DAY 120 tablet 6  . DOCUSATE SODIUM 100 MG PO CAPS Oral Take 100 mg by mouth daily.      Marland Kitchen DOXEPIN HCL 100 MG PO CAPS  Take 9 by mouth daily    . METOPROLOL SUCCINATE ER 200 MG PO TB24  TAKE 1 TABLET BY MOUTH EVERY DAY 30 tablet 6  . OMEPRAZOLE 20 MG PO CPDR  TAKE 1 CAPSULE EVERY DAY 31 capsule 11  . POLYETHYLENE GLYCOL 3350 PO PACK Oral Take 17 g by mouth daily. For constipation. Mix into 8 ounces of fluid & drink 30 each 12  . QUETIAPINE FUMARATE 300 MG PO TABS  Take 3 tabs by mouth each day    . SIMVASTATIN 40 MG PO TABS  TAKE 1 TABLET BY MOUTH AT BEDTIME 90 tablet 3  . SYRINGE/NEEDLE (DISP) 23G X 1" 3 ML MISC  Use as directed. Dispense QS 1 month     . TESTOSTERONE CYPIONATE 100 MG/ML IM OIL Intramuscular Inject  4 mLs (400 mg total) into the muscle every 14 (fourteen) days. 10 mL 3    BP 120/82  Pulse 84  Temp(Src) 99.3 F (37.4 C) (Oral)  Resp 18  SpO2 97%  Physical Exam  Constitutional: He is oriented to person, place, and time. He appears well-developed and well-nourished.  HENT:  Head: Normocephalic.  Eyes: Conjunctivae are normal. Pupils are equal, round, and reactive to light.  Neck: Normal range of motion.  Pulmonary/Chest: Effort normal.  Musculoskeletal: Normal range of motion. He exhibits no edema and no tenderness.  Neurological: He is alert and oriented to person, place, and time. No cranial nerve deficit. Coordination normal.  Skin: Skin is warm and dry.       Mild right orbital ecchymoses  , without edema  Psychiatric: He has a normal mood and affect.    ED Course  Procedures (including critical care time)  Labs Reviewed -  No data to display No results found.   No diagnosis found.    MDM  Fall without signs of brain injury.  There is no indication for CAT scan.  He is asymptomatic now with a normal mental status and normal.  Neurological examination.  There is no indication for testing in the emergency department.          Nicholes Stairs, MD 01/03/12 [phone]

## 2012-01-03 NOTE — ED Notes (Signed)
Took 1/2 tablet seraquel last night, after taking it, stood up, passed out, fell, hit right eye, hematoma over right eye.

## 2012-01-08 ENCOUNTER — Other Ambulatory Visit: Payer: Self-pay

## 2012-01-08 ENCOUNTER — Emergency Department (HOSPITAL_COMMUNITY): Payer: Medicaid Other

## 2012-01-08 ENCOUNTER — Emergency Department (HOSPITAL_COMMUNITY)
Admission: EM | Admit: 2012-01-08 | Discharge: 2012-01-08 | Disposition: A | Discharge status: Home / Self Care | Payer: Medicaid Other | Attending: Emergency Medicine | Admitting: Emergency Medicine

## 2012-01-08 ENCOUNTER — Encounter (HOSPITAL_COMMUNITY): Payer: Self-pay

## 2012-01-08 DIAGNOSIS — N390 Urinary tract infection, site not specified: Secondary | ICD-10-CM | POA: Insufficient documentation

## 2012-01-08 DIAGNOSIS — R4182 Altered mental status, unspecified: Secondary | ICD-10-CM | POA: Insufficient documentation

## 2012-01-08 DIAGNOSIS — S1093XA Contusion of unspecified part of neck, initial encounter: Secondary | ICD-10-CM | POA: Insufficient documentation

## 2012-01-08 DIAGNOSIS — S0003XA Contusion of scalp, initial encounter: Secondary | ICD-10-CM | POA: Insufficient documentation

## 2012-01-08 DIAGNOSIS — R55 Syncope and collapse: Secondary | ICD-10-CM | POA: Insufficient documentation

## 2012-01-08 DIAGNOSIS — F3289 Other specified depressive episodes: Secondary | ICD-10-CM | POA: Insufficient documentation

## 2012-01-08 DIAGNOSIS — Y92009 Unspecified place in unspecified non-institutional (private) residence as the place of occurrence of the external cause: Secondary | ICD-10-CM | POA: Insufficient documentation

## 2012-01-08 DIAGNOSIS — Z7982 Long term (current) use of aspirin: Secondary | ICD-10-CM | POA: Insufficient documentation

## 2012-01-08 DIAGNOSIS — Z79899 Other long term (current) drug therapy: Secondary | ICD-10-CM | POA: Insufficient documentation

## 2012-01-08 DIAGNOSIS — S0083XA Contusion of other part of head, initial encounter: Secondary | ICD-10-CM | POA: Insufficient documentation

## 2012-01-08 DIAGNOSIS — W010XXA Fall on same level from slipping, tripping and stumbling without subsequent striking against object, initial encounter: Secondary | ICD-10-CM | POA: Insufficient documentation

## 2012-01-08 DIAGNOSIS — Z8659 Personal history of other mental and behavioral disorders: Secondary | ICD-10-CM | POA: Insufficient documentation

## 2012-01-08 DIAGNOSIS — F329 Major depressive disorder, single episode, unspecified: Secondary | ICD-10-CM | POA: Insufficient documentation

## 2012-01-08 LAB — URINALYSIS, ROUTINE W REFLEX MICROSCOPIC
Bilirubin Urine: NEGATIVE
Glucose, UA: NEGATIVE mg/dL
Hgb urine dipstick: NEGATIVE
Nitrite: NEGATIVE
Protein, ur: NEGATIVE mg/dL
Specific Gravity, Urine: 1.01 (ref 1.005–1.030)
Urobilinogen, UA: 0.2 mg/dL (ref 0.0–1.0)
pH: 6 (ref 5.0–8.0)

## 2012-01-08 LAB — COMPREHENSIVE METABOLIC PANEL
ALT: 26 U/L (ref 0–53)
AST: 19 U/L (ref 0–37)
Albumin: 4.5 g/dL (ref 3.5–5.2)
Alkaline Phosphatase: 98 U/L (ref 39–117)
BUN: 16 mg/dL (ref 6–23)
CO2: 24 mEq/L (ref 19–32)
Calcium: 9.5 mg/dL (ref 8.4–10.5)
Chloride: 99 mEq/L (ref 96–112)
Creatinine, Ser: 0.94 mg/dL (ref 0.50–1.35)
GFR calc Af Amer: >90 mL/min (ref >90)
GFR calc non Af Amer: >90 mL/min (ref >90)
Glucose, Bld: 121 mg/dL — ABNORMAL HIGH (ref 70–99)
Potassium: 4.5 mEq/L (ref 3.5–5.1)
Sodium: 133 mEq/L — ABNORMAL LOW (ref 135–145)
Total Bilirubin: 0.5 mg/dL (ref 0.3–1.2)
Total Protein: 7.4 g/dL (ref 6.0–8.3)

## 2012-01-08 LAB — POCT I-STAT TROPONIN I: Troponin i, poc: 0 ng/mL (ref 0.00–0.08)

## 2012-01-08 LAB — URINE MICROSCOPIC-ADD ON

## 2012-01-08 LAB — DIFFERENTIAL
Basophils Absolute: 0 10*3/uL (ref 0.0–0.1)
Basophils Relative: 0 % (ref 0–1)
Eosinophils Absolute: 0 10*3/uL (ref 0.0–0.7)
Eosinophils Relative: 1 % (ref 0–5)
Lymphocytes Relative: 15 % (ref 12–46)
Lymphs Abs: 1.1 10*3/uL (ref 0.7–4.0)
Monocytes Absolute: 0.3 10*3/uL (ref 0.1–1.0)
Monocytes Relative: 4 % (ref 3–12)
Neutro Abs: 5.6 10*3/uL (ref 1.7–7.7)
Neutrophils Relative %: 79 % — ABNORMAL HIGH (ref 43–77)

## 2012-01-08 LAB — RAPID URINE DRUG SCREEN, HOSP PERFORMED
Amphetamines: NOT DETECTED
Barbiturates: NOT DETECTED
Benzodiazepines: NOT DETECTED
Cocaine: NOT DETECTED
Opiates: NOT DETECTED
Tetrahydrocannabinol: NOT DETECTED

## 2012-01-08 LAB — CBC
HCT: 40.8 % (ref 39.0–52.0)
Hemoglobin: 14 g/dL (ref 13.0–17.0)
MCH: 31.5 pg (ref 26.0–34.0)
MCHC: 34.3 g/dL (ref 30.0–36.0)
MCV: 91.9 fL (ref 78.0–100.0)
Platelets: 178 10*3/uL (ref 150–400)
RBC: 4.44 MIL/uL (ref 4.22–5.81)
RDW: 14.5 % (ref 11.5–15.5)
WBC: 7 10*3/uL (ref 4.0–10.5)

## 2012-01-08 LAB — PHENYTOIN LEVEL, TOTAL: Phenytoin Lvl: 18.4 ug/mL (ref 10.0–20.0)

## 2012-01-08 LAB — ETHANOL: Alcohol, Ethyl (B): <11 mg/dL (ref 0–11)

## 2012-01-08 MED ORDER — CIPROFLOXACIN HCL 500 MG PO TABS
500.0000 mg | ORAL_TABLET | Freq: Once | ORAL | Status: AC
  Administered 2012-01-08: 500 mg via ORAL
  Filled 2012-01-08: qty 1

## 2012-01-08 MED ORDER — CIPROFLOXACIN HCL 500 MG PO TABS: 500.0000 mg | ORAL_TABLET | Freq: Two times a day (BID) | ORAL | Status: AC

## 2012-01-08 NOTE — ED Notes (Signed)
Per EMS pts family states pt was fine all day then just started running and tripped, pt doesn't recall; pt unsure why he is here; pt has scars to rt arms from cutting; pt rambling with words of no sense.

## 2012-01-08 NOTE — ED Notes (Signed)
Pt from parent's home when while cleaning in the house with a friend, friend reported pt suddenly started running across back yard (per EMS), pt reports no recollection of event but endorses that similar episodes have happened before, pt with hx of schizoaffective disorder. Pt does remember where he was and what he was doing but has no recollection of running across yard. Pt denies SI/HI.

## 2012-01-08 NOTE — ED Notes (Addendum)
After being evaluated by Dr. Lynelle Doctor, this nurse instructed to move pt to acute side to evaluate for possible seizures which was done.
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
## 2012-01-25 ENCOUNTER — Emergency Department (INDEPENDENT_AMBULATORY_CARE_PROVIDER_SITE_OTHER)
Admission: EM | Admit: 2012-01-25 | Discharge: 2012-01-25 | Disposition: A | Discharge status: Home / Self Care | Payer: Medicaid Other | Source: Home / Self Care | Attending: Emergency Medicine | Admitting: Emergency Medicine

## 2012-01-25 ENCOUNTER — Encounter (HOSPITAL_COMMUNITY): Payer: Self-pay

## 2012-01-25 DIAGNOSIS — Z7189 Other specified counseling: Secondary | ICD-10-CM

## 2012-01-25 DIAGNOSIS — R062 Wheezing: Secondary | ICD-10-CM

## 2012-01-25 DIAGNOSIS — R0609 Other forms of dyspnea: Secondary | ICD-10-CM

## 2012-01-25 DIAGNOSIS — R0989 Other specified symptoms and signs involving the circulatory and respiratory systems: Secondary | ICD-10-CM

## 2012-01-25 HISTORY — DX: Gastro-esophageal reflux disease without esophagitis: K21.9

## 2012-01-25 HISTORY — DX: Essential (primary) hypertension: I10

## 2012-01-25 LAB — RAPID URINE DRUG SCREEN, HOSP PERFORMED
Amphetamines: NOT DETECTED
Barbiturates: NOT DETECTED
Benzodiazepines: POSITIVE — AB
Cocaine: NOT DETECTED
Opiates: NOT DETECTED
Tetrahydrocannabinol: NOT DETECTED

## 2012-01-25 LAB — POCT I-STAT, CHEM 8
BUN: 18 mg/dL (ref 6–23)
Calcium, Ion: 1.21 mmol/L (ref 1.12–1.32)
Chloride: 104 mEq/L (ref 96–112)
Creatinine, Ser: 0.8 mg/dL (ref 0.50–1.35)
Glucose, Bld: 98 mg/dL (ref 70–99)
HCT: 42 % (ref 39.0–52.0)
Hemoglobin: 14.3 g/dL (ref 13.0–17.0)
Potassium: 4.1 mEq/L (ref 3.5–5.1)
Sodium: 137 mEq/L (ref 135–145)
TCO2: 25 mmol/L (ref 0–100)

## 2012-01-25 LAB — POCT URINALYSIS DIP (DEVICE)
Bilirubin Urine: NEGATIVE
Glucose, UA: NEGATIVE mg/dL
Hgb urine dipstick: NEGATIVE
Ketones, ur: NEGATIVE mg/dL
Leukocytes, UA: NEGATIVE
Nitrite: NEGATIVE
Protein, ur: NEGATIVE mg/dL
Specific Gravity, Urine: <=1.005 (ref 1.005–1.030)
Urobilinogen, UA: 0.2 mg/dL (ref 0.0–1.0)
pH: 5.5 (ref 5.0–8.0)
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
## 2012-01-28 NOTE — ED Notes (Signed)
Urine drug screen:  Pos. for Benzodiazepines.  Message to Dr. Ladon Applebaum 2/8.  He wrote no further action needed. Cherly Anderson M\ 01/28/2012

## 2012-01-30 ENCOUNTER — Inpatient Hospital Stay (HOSPITAL_COMMUNITY): Payer: Medicaid Other

## 2012-01-30 ENCOUNTER — Encounter (HOSPITAL_COMMUNITY): Payer: Self-pay | Admitting: *Deleted

## 2012-01-30 ENCOUNTER — Emergency Department (HOSPITAL_COMMUNITY): Payer: Medicaid Other

## 2012-01-30 ENCOUNTER — Inpatient Hospital Stay (HOSPITAL_COMMUNITY)
Admission: EM | Admit: 2012-01-30 | Discharge: 2012-02-01 | DRG: 101 | Disposition: A | Discharge status: Home / Self Care | Payer: Medicaid Other | Source: Ambulatory Visit | Attending: Family Medicine | Admitting: Family Medicine

## 2012-01-30 ENCOUNTER — Other Ambulatory Visit: Payer: Self-pay

## 2012-01-30 DIAGNOSIS — I10 Essential (primary) hypertension: Secondary | ICD-10-CM

## 2012-01-30 DIAGNOSIS — R778 Other specified abnormalities of plasma proteins: Secondary | ICD-10-CM

## 2012-01-30 DIAGNOSIS — R55 Syncope and collapse: Secondary | ICD-10-CM

## 2012-01-30 DIAGNOSIS — E875 Hyperkalemia: Secondary | ICD-10-CM | POA: Diagnosis present

## 2012-01-30 DIAGNOSIS — X58XXXA Exposure to other specified factors, initial encounter: Secondary | ICD-10-CM | POA: Diagnosis present

## 2012-01-30 DIAGNOSIS — G40909 Epilepsy, unspecified, not intractable, without status epilepticus: Secondary | ICD-10-CM | POA: Insufficient documentation

## 2012-01-30 DIAGNOSIS — I428 Other cardiomyopathies: Secondary | ICD-10-CM | POA: Diagnosis present

## 2012-01-30 DIAGNOSIS — E785 Hyperlipidemia, unspecified: Secondary | ICD-10-CM

## 2012-01-30 DIAGNOSIS — K219 Gastro-esophageal reflux disease without esophagitis: Secondary | ICD-10-CM | POA: Insufficient documentation

## 2012-01-30 DIAGNOSIS — T23209A Burn of second degree of unspecified hand, unspecified site, initial encounter: Secondary | ICD-10-CM | POA: Diagnosis present

## 2012-01-30 DIAGNOSIS — F332 Major depressive disorder, recurrent severe without psychotic features: Secondary | ICD-10-CM | POA: Insufficient documentation

## 2012-01-30 DIAGNOSIS — F329 Major depressive disorder, single episode, unspecified: Secondary | ICD-10-CM | POA: Diagnosis present

## 2012-01-30 DIAGNOSIS — R569 Unspecified convulsions: Secondary | ICD-10-CM

## 2012-01-30 DIAGNOSIS — I369 Nonrheumatic tricuspid valve disorder, unspecified: Secondary | ICD-10-CM

## 2012-01-30 DIAGNOSIS — F3289 Other specified depressive episodes: Secondary | ICD-10-CM | POA: Diagnosis present

## 2012-01-30 DIAGNOSIS — S298XXA Other specified injuries of thorax, initial encounter: Secondary | ICD-10-CM | POA: Diagnosis present

## 2012-01-30 DIAGNOSIS — R7989 Other specified abnormal findings of blood chemistry: Secondary | ICD-10-CM

## 2012-01-30 DIAGNOSIS — R079 Chest pain, unspecified: Secondary | ICD-10-CM

## 2012-01-30 HISTORY — DX: Hyperlipidemia, unspecified: E78.5

## 2012-01-30 LAB — CBC
HCT: 43.6 % (ref 39.0–52.0)
Hemoglobin: 15.8 g/dL (ref 13.0–17.0)
MCH: 33.2 pg (ref 26.0–34.0)
MCHC: 36.2 g/dL — ABNORMAL HIGH (ref 30.0–36.0)
MCV: 91.6 fL (ref 78.0–100.0)
Platelets: 193 10*3/uL (ref 150–400)
RBC: 4.76 MIL/uL (ref 4.22–5.81)
RDW: 13.9 % (ref 11.5–15.5)
WBC: 8.9 10*3/uL (ref 4.0–10.5)

## 2012-01-30 LAB — CARDIAC PANEL(CRET KIN+CKTOT+MB+TROPI)
CK, MB: 1.8 ng/mL (ref 0.3–4.0)
CK, MB: 2.1 ng/mL (ref 0.3–4.0)
CK, MB: 1.7 ng/mL (ref 0.3–4.0)
Relative Index: 1.3 (ref 0.0–2.5)
Relative Index: INVALID (ref 0.0–2.5)
Relative Index: INVALID (ref 0.0–2.5)
Total CK: 141 U/L (ref 7–232)
Total CK: 62 U/L (ref 7–232)
Total CK: 49 U/L (ref 7–232)
Troponin I: 1.01 ng/mL (ref <0.30)
Troponin I: <0.3 ng/mL (ref <0.30)
Troponin I: <0.3 ng/mL (ref <0.30)

## 2012-01-30 LAB — COMPREHENSIVE METABOLIC PANEL
ALT: 47 U/L (ref 0–53)
AST: 72 U/L — ABNORMAL HIGH (ref 0–37)
Albumin: 4 g/dL (ref 3.5–5.2)
Alkaline Phosphatase: 93 U/L (ref 39–117)
BUN: 11 mg/dL (ref 6–23)
CO2: 21 mEq/L (ref 19–32)
Calcium: 8.9 mg/dL (ref 8.4–10.5)
Chloride: 99 mEq/L (ref 96–112)
Creatinine, Ser: 0.69 mg/dL (ref 0.50–1.35)
GFR calc Af Amer: >90 mL/min (ref >90)
GFR calc non Af Amer: >90 mL/min (ref >90)
Glucose, Bld: 110 mg/dL — ABNORMAL HIGH (ref 70–99)
Potassium: 5.7 mEq/L — ABNORMAL HIGH (ref 3.5–5.1)
Sodium: 131 mEq/L — ABNORMAL LOW (ref 135–145)
Total Bilirubin: 0.3 mg/dL (ref 0.3–1.2)
Total Protein: 7 g/dL (ref 6.0–8.3)

## 2012-01-30 LAB — URINALYSIS, ROUTINE W REFLEX MICROSCOPIC
Bilirubin Urine: NEGATIVE
Glucose, UA: NEGATIVE mg/dL
Hgb urine dipstick: NEGATIVE
Ketones, ur: NEGATIVE mg/dL
Leukocytes, UA: NEGATIVE
Nitrite: NEGATIVE
Protein, ur: NEGATIVE mg/dL
Specific Gravity, Urine: 1.005 (ref 1.005–1.030)
Urobilinogen, UA: 0.2 mg/dL (ref 0.0–1.0)
pH: 6.5 (ref 5.0–8.0)

## 2012-01-30 LAB — BASIC METABOLIC PANEL
BUN: 10 mg/dL (ref 6–23)
CO2: 24 mEq/L (ref 19–32)
Calcium: 9.6 mg/dL (ref 8.4–10.5)
Chloride: 104 mEq/L (ref 96–112)
Creatinine, Ser: 0.75 mg/dL (ref 0.50–1.35)
GFR calc Af Amer: >90 mL/min (ref >90)
GFR calc non Af Amer: >90 mL/min (ref >90)
Glucose, Bld: 130 mg/dL — ABNORMAL HIGH (ref 70–99)
Potassium: 4.4 mEq/L (ref 3.5–5.1)
Sodium: 138 mEq/L (ref 135–145)

## 2012-01-30 LAB — T4, FREE: Free T4: 0.91 ng/dL (ref 0.80–1.80)

## 2012-01-30 LAB — MRSA PCR SCREENING: MRSA by PCR: POSITIVE — AB

## 2012-01-30 LAB — PHENYTOIN LEVEL, TOTAL: Phenytoin Lvl: 15.8 ug/mL (ref 10.0–20.0)

## 2012-01-30 LAB — TSH: TSH: 1.603 u[IU]/mL (ref 0.350–4.500)

## 2012-01-30 MED ORDER — ONDANSETRON HCL 4 MG PO TABS: 4.0000 mg | ORAL_TABLET | Freq: Four times a day (QID) | ORAL | Status: DC | PRN

## 2012-01-30 MED ORDER — SILVER SULFADIAZINE 1 % EX CREA
TOPICAL_CREAM | Freq: Every day | CUTANEOUS | Status: DC
  Administered 2012-01-30 – 2012-02-01 (×3): via TOPICAL
  Filled 2012-01-30: qty 85

## 2012-01-30 MED ORDER — CHLORHEXIDINE GLUCONATE CLOTH 2 % EX PADS
6.0000 | MEDICATED_PAD | Freq: Every day | CUTANEOUS | Status: DC
  Administered 2012-01-30 – 2012-02-01 (×3): 6 via TOPICAL

## 2012-01-30 MED ORDER — PANTOPRAZOLE SODIUM 40 MG PO TBEC
40.0000 mg | DELAYED_RELEASE_TABLET | Freq: Every day | ORAL | Status: DC
  Administered 2012-01-30 – 2012-02-01 (×3): 40 mg via ORAL
  Filled 2012-01-30 (×3): qty 1

## 2012-01-30 MED ORDER — PHENYTOIN 50 MG PO CHEW
200.0000 mg | CHEWABLE_TABLET | Freq: Every day | ORAL | Status: DC
  Administered 2012-01-30 – 2012-02-01 (×3): 200 mg via ORAL
  Filled 2012-01-30 (×3): qty 4

## 2012-01-30 MED ORDER — CLONAZEPAM 1 MG PO TABS
2.0000 mg | ORAL_TABLET | Freq: Every day | ORAL | Status: DC
  Administered 2012-01-30 – 2012-01-31 (×2): 2 mg via ORAL
  Filled 2012-01-30 (×2): qty 2

## 2012-01-30 MED ORDER — QUETIAPINE FUMARATE 300 MG PO TABS
900.0000 mg | ORAL_TABLET | Freq: Every day | ORAL | Status: DC
  Administered 2012-01-30 – 2012-01-31 (×2): 900 mg via ORAL
  Filled 2012-01-30 (×3): qty 3

## 2012-01-30 MED ORDER — HEPARIN SOD (PORCINE) IN D5W 100 UNIT/ML IV SOLN
14.0000 [IU]/kg/h | INTRAVENOUS | Status: DC
  Administered 2012-01-30: 14 [IU]/kg/h via INTRAVENOUS
  Filled 2012-01-30: qty 250

## 2012-01-30 MED ORDER — SODIUM POLYSTYRENE SULFONATE 15 GM/60ML PO SUSP
30.0000 g | Freq: Once | ORAL | Status: AC
Start: 2012-01-30 — End: 2012-01-30
  Administered 2012-01-30: 30 g via ORAL
  Filled 2012-01-30: qty 120

## 2012-01-30 MED ORDER — DOXEPIN HCL 50 MG PO CAPS
450.0000 mg | ORAL_CAPSULE | Freq: Every day | ORAL | Status: DC
  Filled 2012-01-30: qty 1

## 2012-01-30 MED ORDER — MUPIROCIN 2 % EX OINT
1.0000 "application " | TOPICAL_OINTMENT | Freq: Two times a day (BID) | CUTANEOUS | Status: DC
  Administered 2012-01-30 – 2012-02-01 (×5): 1 via NASAL
  Filled 2012-01-30: qty 22

## 2012-01-30 MED ORDER — DOXEPIN HCL 75 MG PO CAPS
150.0000 mg | ORAL_CAPSULE | Freq: Every day | ORAL | Status: DC
  Administered 2012-01-30 – 2012-01-31 (×2): 150 mg via ORAL
  Filled 2012-01-30 (×4): qty 2

## 2012-01-30 MED ORDER — SIMVASTATIN 40 MG PO TABS
40.0000 mg | ORAL_TABLET | Freq: Every day | ORAL | Status: DC
  Administered 2012-01-30 – 2012-01-31 (×2): 40 mg via ORAL
  Filled 2012-01-30 (×3): qty 1

## 2012-01-30 MED ORDER — HEPARIN BOLUS VIA INFUSION
4000.0000 [IU] | Freq: Once | INTRAVENOUS | Status: AC
Start: 2012-01-30 — End: 2012-01-30
  Administered 2012-01-30: 4000 [IU] via INTRAVENOUS

## 2012-01-30 MED ORDER — ASPIRIN 81 MG PO CHEW
324.0000 mg | CHEWABLE_TABLET | Freq: Once | ORAL | Status: AC
  Administered 2012-01-30: 324 mg via ORAL
  Filled 2012-01-30: qty 4

## 2012-01-30 MED ORDER — POLYETHYLENE GLYCOL 3350 17 G PO PACK
17.0000 g | PACK | Freq: Every day | ORAL | Status: DC
  Administered 2012-01-31 – 2012-02-01 (×2): 17 g via ORAL
  Filled 2012-01-30 (×3): qty 1

## 2012-01-30 MED ORDER — REGADENOSON 0.4 MG/5ML IV SOLN
0.4000 mg | Freq: Once | INTRAVENOUS | Status: AC
  Administered 2012-01-31: 0.4 mg via INTRAVENOUS
  Filled 2012-01-30: qty 5

## 2012-01-30 MED ORDER — METOPROLOL SUCCINATE ER 100 MG PO TB24
200.0000 mg | ORAL_TABLET | Freq: Every day | ORAL | Status: DC
  Administered 2012-01-30 – 2012-02-01 (×2): 200 mg via ORAL
  Filled 2012-01-30 (×2): qty 2

## 2012-01-30 MED ORDER — DOXEPIN HCL 50 MG PO CAPS: 450.0000 mg | ORAL_CAPSULE | Freq: Every day | ORAL | Status: DC

## 2012-01-30 MED ORDER — ACETAMINOPHEN 325 MG PO TABS
650.0000 mg | ORAL_TABLET | Freq: Four times a day (QID) | ORAL | Status: DC | PRN
  Administered 2012-02-01: 650 mg via ORAL
  Filled 2012-01-30: qty 2

## 2012-01-30 MED ORDER — DOXEPIN HCL 75 MG PO CAPS
150.0000 mg | ORAL_CAPSULE | Freq: Every day | ORAL | Status: DC
  Filled 2012-01-30: qty 2

## 2012-01-30 MED ORDER — ENOXAPARIN SODIUM 40 MG/0.4ML ~~LOC~~ SOLN
40.0000 mg | SUBCUTANEOUS | Status: DC
  Administered 2012-01-30 – 2012-02-01 (×3): 40 mg via SUBCUTANEOUS
  Filled 2012-01-30 (×4): qty 0.4

## 2012-01-30 MED ORDER — ONDANSETRON HCL 4 MG/2ML IJ SOLN
4.0000 mg | Freq: Four times a day (QID) | INTRAMUSCULAR | Status: DC | PRN
  Administered 2012-01-30: 4 mg via INTRAVENOUS
  Filled 2012-01-30: qty 2

## 2012-01-30 MED ORDER — BUPROPION HCL ER (XL) 300 MG PO TB24
300.0000 mg | ORAL_TABLET | Freq: Every day | ORAL | Status: DC
  Filled 2012-01-30: qty 1

## 2012-01-30 MED ORDER — ACETAMINOPHEN 650 MG RE SUPP: 650.0000 mg | Freq: Four times a day (QID) | RECTAL | Status: DC | PRN

## 2012-01-30 NOTE — Progress Notes (Signed)
Echocardiogram 2D Echocardiogram has been performed.  [name] [name] 01/30/2012, 1:40 PM

## 2012-01-30 NOTE — ED Notes (Signed)
Transported to CT Scan
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
## 2012-01-30 NOTE — Procedures (Signed)
EEG at bedside completed. 

## 2012-01-30 NOTE — Progress Notes (Signed)
ANTICOAGULATION CONSULT NOTE - Initial Consult  Pharmacy Consult for heparin  Indication: chest pain/ACS  Allergies  Allergen Reactions  . Sulfa Antibiotics Nausea And Vomiting  . Sulfonamide Derivatives     REACTION: Nausea    Patient Measurements: Height: 5\' 11"  (180.3 cm) Weight: 170 lb (77.111 kg) IBW/kg (Calculated) : 75.3  Heparin Dosing Weight:   Vital Signs: Temp: 99.4 F (37.4 C) (02/13 0619) Temp src: Oral (02/13 0619) BP: 120/84 mmHg (02/13 0619) Pulse Rate: 80  (02/13 0504)  Labs:  Basename 01/30/12 0300  HGB --  HCT --  PLT --  APTT --  LABPROT --  INR --  HEPARINUNFRC --  CREATININE 0.69  CKTOTAL 141  CKMB 1.8  TROPONINI 1.01*   Estimated Creatinine Clearance: 108.5 ml/min (by C-G formula based on Cr of 0.69).  Medical History: Past Medical History  Diagnosis Date  . Schizoaffective disorder   . Depression   . Seizures   . Hypertension   . GERD (gastroesophageal reflux disease)     Medications:  Prescriptions prior to admission  Medication Sig Dispense Refill  . aspirin EC 81 MG tablet Take 81 mg by mouth daily.      Marland Kitchen buPROPion (WELLBUTRIN XL) 300 MG 24 hr tablet Take 300 mg by mouth daily.      . clonazePAM (KLONOPIN) 2 MG tablet 5 mg at bedtime. Take 2 & 1/2 tabs by mouth twice a day.  Fill on or after 08/18/11      . doxepin (SINEQUAN) 150 MG capsule Take 150 mg by mouth at bedtime. 6 tabs for 900 mg dosage      . metoprolol (TOPROL-XL) 200 MG 24 hr tablet Take 200 mg by mouth daily.       . Multiple Vitamin (MULITIVITAMIN WITH MINERALS) TABS Take 1 tablet by mouth daily.      Marland Kitchen omeprazole (PRILOSEC) 20 MG capsule Take 20 mg by mouth daily.      . phenytoin (DILANTIN) 50 MG tablet Chew 200 mg by mouth daily.      . polyethylene glycol (MIRALAX / GLYCOLAX) packet Take 17 g by mouth daily. For constipation. Mix into 8 ounces of fluid & drink      . QUEtiapine (SEROQUEL) 300 MG tablet Take 900 mg by mouth at bedtime. Take 3 tabs by mouth  each day      . simvastatin (ZOCOR) 40 MG tablet Take 40 mg by mouth at bedtime.      Marland Kitchen testosterone cypionate (DEPOTESTOTERONE CYPIONATE) 100 MG/ML injection Inject 4 mLs (400 mg total) into the muscle every 14 (fourteen) days.  10 mL  3    Assessment: ACS. Heparin begun at [facility] with 4000 unit bolus and 1100 units/hr. HL at 1000 this am  Goal of Therapy:  Heparin level 0.3-0.7 units/ml   Plan:  Continue heparin at 1100 units/hr and check HL at 10 am  Daily HL and CBC starting 2/14  Gibson, Lad 01/30/2012,6:28 AM

## 2012-01-30 NOTE — ED Notes (Signed)
Per pt,  His height is 71 inches, his stated weight 170 lbs.

## 2012-01-30 NOTE — Progress Notes (Signed)
Received from [facility] ED per [facility], pain free, A&O x 4, vss.  Family Practice MD notified of arrival and need for orders.  Tele SR 80's.
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
## 2012-01-30 NOTE — H&P (Signed)
Seen and examined.  Discussed with Dr. Rivka Safer and full FM team.  Agree with their management.  Briefly 56 yo male with likely seizures.  He has know chronic seizure disorder which has been well controled for years on dilantin.  Recently, he has had multiple episodes of confusion and/or lost time.  One of these spells was witness by a friend prompting hospitalization.  Issues: 1. Likely seizures previously controled by dilantin.  He is on two psych meds which may contribute.  He is on an extremely high dose of doxepin, which he states his psychiatrist has followed with blood levels.  Still, tricyclics, especially in high doses, can lower the seizure threshold.  He was also recently prescribed Wellbutrin which is contraindicated in seizure disorder.  He states the timing of the spells correspond with him starting the Wellbutrin.  Will DC Wellbutrin, decrease doxepin and ask neuro to see for further recommendations.  2. Chest pain, low level positive troponin.  Cards involved.  We are ruling out.  I personally doubt ACS and feel this is likely just chest trauma from seizures.  3. At risk for withdrawal (another reason for seizures) due to some EtOH intake and a large daily dose of benzo.  We will continue benzo and augment as needed with CIWA protocol.
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
## 2012-01-30 NOTE — ED Notes (Signed)
Pt bed assignment changed to [facility], report called by Fortino Sic
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
## 2012-01-31 ENCOUNTER — Inpatient Hospital Stay (HOSPITAL_COMMUNITY): Payer: Medicaid Other

## 2012-01-31 DIAGNOSIS — R079 Chest pain, unspecified: Secondary | ICD-10-CM

## 2012-01-31 DIAGNOSIS — R55 Syncope and collapse: Secondary | ICD-10-CM

## 2012-01-31 LAB — BASIC METABOLIC PANEL
BUN: 13 mg/dL (ref 6–23)
CO2: 27 mEq/L (ref 19–32)
Calcium: 9.3 mg/dL (ref 8.4–10.5)
Chloride: 104 mEq/L (ref 96–112)
Creatinine, Ser: 0.87 mg/dL (ref 0.50–1.35)
GFR calc Af Amer: >90 mL/min (ref >90)
GFR calc non Af Amer: >90 mL/min (ref >90)
Glucose, Bld: 107 mg/dL — ABNORMAL HIGH (ref 70–99)
Potassium: 4.7 mEq/L (ref 3.5–5.1)
Sodium: 138 mEq/L (ref 135–145)

## 2012-01-31 LAB — CBC
HCT: 42.6 % (ref 39.0–52.0)
Hemoglobin: 15.6 g/dL (ref 13.0–17.0)
MCH: 33.5 pg (ref 26.0–34.0)
MCHC: 36.6 g/dL — ABNORMAL HIGH (ref 30.0–36.0)
MCV: 91.6 fL (ref 78.0–100.0)
Platelets: 176 10*3/uL (ref 150–400)
RBC: 4.65 MIL/uL (ref 4.22–5.81)
RDW: 14 % (ref 11.5–15.5)
WBC: 7.2 10*3/uL (ref 4.0–10.5)

## 2012-01-31 LAB — GLUCOSE, CAPILLARY: Glucose-Capillary: 121 mg/dL — ABNORMAL HIGH (ref 70–99)

## 2012-01-31 MED ORDER — TECHNETIUM TC 99M TETROFOSMIN IV KIT
10.0000 | PACK | Freq: Once | INTRAVENOUS | Status: AC | PRN
  Administered 2012-01-31: 10 via INTRAVENOUS

## 2012-01-31 MED ORDER — TECHNETIUM TC 99M TETROFOSMIN IV KIT
30.0000 | PACK | Freq: Once | INTRAVENOUS | Status: AC | PRN
  Administered 2012-01-31: 30 via INTRAVENOUS

## 2012-01-31 MED ORDER — ZOLPIDEM TARTRATE 5 MG PO TABS
5.0000 mg | ORAL_TABLET | Freq: Every evening | ORAL | Status: DC | PRN
  Administered 2012-01-31: 5 mg via ORAL
  Filled 2012-01-31: qty 1

## 2012-01-31 NOTE — Procedures (Signed)
EEG ID:  [id number].  HISTORY:  This is a 56 years old man with syncope who also takes Dilantin for epilepsy.  MEDICATIONS:  Klonopin and Dilantin.  CONDITION OF RECORDING:  This 16-lead EEG was recorded with the patient in awake and drowsy states.  Background rhythm: background patterns in wakefulness were well organized with a well sustained posterior dominant of 8.5 Hz, symmetrical and reactive to opening and closing.  Drowsiness was associated with mild attenuation in voltage and slowing frequencies. Abnormal potentials: no epileptiform activity or focal slowing was noted.  ACTIVATION PROCEDURES:  Hyperventilation was not performed.  Photic stimulation did not activate tracing.  EKG:  Single-channel of EKG monitoring was unremarkable.  IMPRESSION:  This is a normal awake and drowsy EEG.  A normal EEG does not rule out the clinical diagnosis of epilepsy. If clinically warranted, a repeat extended EEG or ambulatory requiring may be obtained for prolonged recording times and sleep capture, which may increase the diagnostic yield.  Clinical correlation is suggested.          ______________________________ Carmell Austria, MD    [id number] D:  01/30/2012 15:47:34  T:  01/30/2012 22:04:50  Job #:  [id number]

## 2012-01-31 NOTE — Progress Notes (Signed)
Patient's dressings to his fingers on left hand.  Cleansed with normal saline and silverdene cream applied.  Fingers redressed.  Will continue to monitor.  Colman Cater
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
## 2012-01-31 NOTE — Progress Notes (Signed)
Patient in nuclear medicine for stress test.  Unable to do q4 vital sings or neuro checks.  Will continue to monitor.  [name]

## 2012-01-31 NOTE — Progress Notes (Signed)
Seen and examined.  Appreciate both neuro and cards help.  Discussed in rounds and agree wit Dr. Satira Sark management.  The etiology of his spell remains unclear - psych versus seizure.  We do not plan further work up.  We would like to discuss with his outpatient psychiatrist to help [place] with DC meds.

## 2012-01-31 NOTE — Progress Notes (Addendum)
Patient Name: [name] Date of Encounter: 01/31/2012     Active Problems:  HYPERLIPIDEMIA  DEPRESSION, CHRONIC, SEVERE  HYPERTENSION  GERD  SEIZURE DISORDER, HX OF  Chest pain    SUBJECTIVE  No c/p, sob. For MV today.  CURRENT MEDS    . Chlorhexidine Gluconate Cloth  6 each Topical Q0600  . clonazePAM  2 mg Oral QHS  . doxepin  150 mg Oral QHS  . enoxaparin (LOVENOX) injection  40 mg Subcutaneous Q24H  . metoprolol  200 mg Oral Daily  . mupirocin ointment  1 application Nasal BID  . pantoprazole  40 mg Oral Q1200  . phenytoin  200 mg Oral Daily  . polyethylene glycol  17 g Oral Daily  . QUEtiapine  900 mg Oral QHS  . regadenoson  0.4 mg Intravenous Once  . silver sulfADIAZINE   Topical Daily  . simvastatin  40 mg Oral QHS    OBJECTIVE  Filed Vitals:   01/30/12 2355 01/31/12 0002 01/31/12 0500 01/31/12 0745  BP: 130/81 105/68 111/69 125/76  Pulse: 71 75  73  Temp: 98.5 F (36.9 C) 98 F (36.7 C) 98 F (36.7 C) 98 F (36.7 C)  TempSrc: Oral Oral Oral Oral  Resp: 20 20 20 18   Height:      Weight:      SpO2: 96% 95% 95% 95%    Intake/Output Summary (Last 24 hours) at 01/31/12 1148 Last data filed at 01/31/12 0100  Gross per 24 hour  Intake    240 ml  Output    300 ml  Net    -60 ml   Filed Weights   01/30/12 0419  Weight: 170 lb (77.111 kg)    PHYSICAL EXAM  General: Pleasant, NAD. Neuro: Alert and oriented X 3. Moves all extremities spontaneously. Psych: Normal affect. HEENT:  Normal  Neck: Supple without bruits or JVD. Lungs:  Resp regular and unlabored, CTA. Heart: RRR no s3, s4, or murmurs. Abdomen: Soft, non-tender, non-distended, BS + x 4.  Extremities: No clubbing, cyanosis or edema. DP/PT/Radials 2+ and equal bilaterally.  Fingers on left hand wrapped - d/i.  Accessory Clinical Findings  CBC  Basename 01/31/12 0500 01/30/12 1009  WBC 7.2 8.9  NEUTROABS -- --  HGB 15.6 15.8  HCT 42.6 43.6  MCV 91.6 91.6  PLT 176 193    Basic Metabolic Panel  Basename 01/31/12 0500 01/30/12 1009  NA 138 138  K 4.7 4.4  CL 104 104  CO2 27 24  GLUCOSE 107* 130*  BUN 13 10  CREATININE 0.87 0.75  CALCIUM 9.3 9.6  MG -- --  PHOS -- --   Liver Function Tests  Basename 01/30/12 0300  AST 72*  ALT 47  ALKPHOS 93  BILITOT 0.3  PROT 7.0  ALBUMIN 4.0   Cardiac Enzymes  Basename 01/30/12 1314 01/30/12 0800 01/30/12 0300  CKTOTAL 49 62 141  CKMB 1.7 2.1 1.8  CKMBINDEX -- -- --  TROPONINI <0.30 <0.30 1.01*   Thyroid Function Tests  Basename 01/30/12 1622  TSH 1.603  T4TOTAL --  T3FREE --  THYROIDAB --   Radiology/Studies  Dg Chest 2 View  01/30/2012  *RADIOLOGY REPORT*  Clinical Data: Right upper chest pain after fall from a few days ago.  CHEST - 2 VIEW  Comparison: 10/25/2010  Findings: Shallow inspiration.  Normal heart size and pulmonary vascularity.  Linear fibrosis in the lung bases.  Old left rib fractures.  No focal airspace consolidation.  No blunting of costophrenic angles.  No pneumothorax.  Degenerative changes in the thoracic spine.  IMPRESSION: Shallow inspiration.  Linear fibrosis in the lungs.  No evidence of active infiltration.  Original Report Authenticated By: Marlon Pel, M.D.   Ct Head Wo Contrast  01/30/2012  *RADIOLOGY REPORT*  Clinical Data: The patient is having blackouts.  Unknown head injury tonight.  Scans of active disorder.  History of seizures.  CT HEAD WITHOUT CONTRAST  Technique:  Contiguous axial images were obtained from the base of the skull through the vertex without contrast.  Comparison: 01/08/2012  Findings: Mild cerebral atrophy.  No mass effect or midline shift. No abnormal extra-axial fluid collections.  Gray-white matter junctions are distinct.  Basal cisterns are not effaced.  No ventricular dilatation.  No evidence of acute intracranial hemorrhage.  No depressed skull fractures.  Visualized paranasal sinuses are not opacified.  No significant change since  previous study.  IMPRESSION: Mild cerebral atrophy.  No evidence of acute intracranial hemorrhage, mass lesion, or acute infarct.  Original Report Authenticated By: Marlon Pel, M.D.   01/31/2012 *2D Echo Study Conclusions  - Left ventricle: Wall thickness was increased in a pattern of mild LVH. Systolic function was moderately reduced. The estimated ejection fraction was in the range of 35% to 40%. Diffuse hypokinesis. - Ventricular septum: Septal motion showed paradox.   ASSESSMENT AND PLAN  1.  Elevated Troponin:  Subsequent troponins negative.  No c/p.  For MV today.  2.  Cardiomyopathy:  EF 35-40%, diff HK.  MV today to r/o ischemia.  Signed, Nicolasa Ducking NP Patient seen and examined and history reviewed. Agree with above findings and plan. Myoview final report pending. I reviewed images and I do not see any significant perfusion abnormality. EF 58%. I went back and looked at Echo images. I think reported EF by Echo is falsely low. Heart significantly angulated on short axis views and apical views are poor.  Therefore, it appears that he has normal LV function without significant perfusion defects. Will await official nuclear report. If normal we will sign off.  Thedora Hinders 01/31/2012 1:59 PM

## 2012-02-01 MED ORDER — SILVER SULFADIAZINE 1 % EX CREA: TOPICAL_CREAM | Freq: Every day | CUTANEOUS | Status: DC

## 2012-02-01 MED ORDER — DOXEPIN HCL 100 MG PO CAPS
900.0000 mg | ORAL_CAPSULE | Freq: Every day | ORAL | Status: DC
  Filled 2012-02-01: qty 9

## 2012-02-01 MED ORDER — CLONAZEPAM 2 MG PO TABS: 2.0000 mg | ORAL_TABLET | Freq: Every day | ORAL | Status: DC

## 2012-02-01 NOTE — Progress Notes (Signed)
Clinical Child psychotherapist (CSW) informed by RN that pt does not have transportation home. CSW spoke with pt who confirmed that he did not have a ride home and that the bus did not go as far as he lived. CSW confirmed that pt okay to transfer by [facility] and not in need of a non emergency ambulance. CSW informed pt that she would get a taxi for pt home. CSW provided RN with taxi voucher and the number to [employer] [phone]. CSW is signing off.  [name], MSW, Theresia Majors [phone]

## 2012-02-01 NOTE — Progress Notes (Signed)
Reviewed discharge instructions with patient, he stated his understandings.  Reviewed with patient how to do dressing changes to his fingers.  Discussed changes in medications.  IV discontinued,cath intact.  Patient discharged via wheelchair and cab home.  [name]

## 2012-02-01 NOTE — Discharge Instructions (Addendum)
You were admitted to the hospital due to concern for a seizure or heart problems. You did very well while here and are free to resume all of your normal daily activities. Please follow up with your psychiatrist Dr. Betti Cruz on 2/26 at 4pm and with Dr. Denyse Amass on Monday at 8:45am. We've changed some of your medications while here in the hospital. Please take them as outlined below. Have a great weekend.
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
## 2012-02-01 NOTE — Progress Notes (Signed)
Seen and examined.  Discussed with full team and specifically with Dr. Konrad Dolores.  Complex patient with major psychiatric issues on an unusually high dose of medications.  We have conferred with his outpatient psychiatrist who has reassured [place] that appropriate blood levels have been checked and the high dose is justified.  The psychiatrist recommends DC on chronic meds (except no wellbutrin)    He has had no spells in the hospital and is medically stable.  Agree with DC today.

## 2012-02-04 ENCOUNTER — Encounter: Payer: Self-pay | Admitting: Family Medicine

## 2012-02-04 ENCOUNTER — Ambulatory Visit (INDEPENDENT_AMBULATORY_CARE_PROVIDER_SITE_OTHER): Payer: No Typology Code available for payment source | Admitting: Family Medicine

## 2012-02-04 ENCOUNTER — Ambulatory Visit: Payer: No Typology Code available for payment source | Admitting: Family Medicine

## 2012-02-04 VITALS — BP 128/94 | HR 75 | Temp 97.9°F | Ht 71.0 in | Wt 178.6 lb

## 2012-02-04 DIAGNOSIS — F19921 Other psychoactive substance use, unspecified with intoxication with delirium: Secondary | ICD-10-CM

## 2012-02-04 DIAGNOSIS — Z125 Encounter for screening for malignant neoplasm of prostate: Secondary | ICD-10-CM

## 2012-02-04 DIAGNOSIS — R410 Disorientation, unspecified: Secondary | ICD-10-CM

## 2012-02-04 DIAGNOSIS — T23039A Burn of unspecified degree of unspecified multiple fingers (nail), not including thumb, initial encounter: Secondary | ICD-10-CM | POA: Insufficient documentation

## 2012-02-04 DIAGNOSIS — T50905A Adverse effect of unspecified drugs, medicaments and biological substances, initial encounter: Secondary | ICD-10-CM | POA: Insufficient documentation

## 2012-02-04 DIAGNOSIS — E291 Testicular hypofunction: Secondary | ICD-10-CM

## 2012-02-04 DIAGNOSIS — T23099A Burn of unspecified degree of multiple sites of unspecified wrist and hand, initial encounter: Secondary | ICD-10-CM

## 2012-02-04 DIAGNOSIS — T50904A Poisoning by unspecified drugs, medicaments and biological substances, undetermined, initial encounter: Secondary | ICD-10-CM

## 2012-02-04 DIAGNOSIS — E785 Hyperlipidemia, unspecified: Secondary | ICD-10-CM

## 2012-02-04 DIAGNOSIS — T23009A Burn of unspecified degree of unspecified hand, unspecified site, initial encounter: Secondary | ICD-10-CM | POA: Insufficient documentation

## 2012-02-04 LAB — LIPID PANEL
Cholesterol: 153 mg/dL (ref 0–200)
HDL: 48 mg/dL (ref >39)
LDL Cholesterol: 77 mg/dL (ref 0–99)
Total CHOL/HDL Ratio: 3.2 Ratio
Triglycerides: 142 mg/dL (ref <150)
VLDL: 28 mg/dL (ref 0–40)

## 2012-02-04 LAB — PSA: PSA: 0.2 ng/mL (ref <=4.00)

## 2012-02-04 MED ORDER — TESTOSTERONE CYPIONATE 100 MG/ML IM SOLN: 400.0000 mg | INTRAMUSCULAR | Status: DC

## 2012-02-04 NOTE — Assessment & Plan Note (Signed)
And is doing well. Recommended continuation of Silvadene cream and application of topical antibiotic ointment or petroleum jelly. Additionally recommend application of nonadherent dressings.  Plan to followup in one month

## 2012-02-04 NOTE — Assessment & Plan Note (Signed)
Likely he developed delirium do to withdrawal of clonazepam.  He is certainly doing well now and shows no sign of delirium.  Additionally his Wellbutrin has been discontinued. Plan to continue clonazepam as well as other psychiatric medications and ask patient to followup with psychiatry and neurology. Follow up with me in one month
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
## 2012-02-04 NOTE — Assessment & Plan Note (Signed)
Plan to refill testosterone today
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
## 2012-02-06 NOTE — Discharge Summary (Signed)
Agree with discharge and plans as outlined by Dr. Konrad Dolores.

## 2012-02-08 ENCOUNTER — Emergency Department (HOSPITAL_COMMUNITY)
Admission: EM | Admit: 2012-02-08 | Discharge: 2012-02-09 | Disposition: A | Discharge status: Home / Self Care | Payer: Medicaid Other | Attending: Emergency Medicine | Admitting: Emergency Medicine

## 2012-02-08 ENCOUNTER — Encounter (HOSPITAL_COMMUNITY): Payer: Self-pay | Admitting: *Deleted

## 2012-02-08 DIAGNOSIS — M79609 Pain in unspecified limb: Secondary | ICD-10-CM | POA: Insufficient documentation

## 2012-02-08 DIAGNOSIS — E785 Hyperlipidemia, unspecified: Secondary | ICD-10-CM | POA: Insufficient documentation

## 2012-02-08 DIAGNOSIS — X088XXA Exposure to other specified smoke, fire and flames, initial encounter: Secondary | ICD-10-CM | POA: Insufficient documentation

## 2012-02-08 DIAGNOSIS — I10 Essential (primary) hypertension: Secondary | ICD-10-CM | POA: Insufficient documentation

## 2012-02-08 DIAGNOSIS — R569 Unspecified convulsions: Secondary | ICD-10-CM | POA: Insufficient documentation

## 2012-02-08 DIAGNOSIS — R079 Chest pain, unspecified: Secondary | ICD-10-CM | POA: Insufficient documentation

## 2012-02-08 DIAGNOSIS — IMO0002 Reserved for concepts with insufficient information to code with codable children: Secondary | ICD-10-CM

## 2012-02-08 DIAGNOSIS — F259 Schizoaffective disorder, unspecified: Secondary | ICD-10-CM | POA: Insufficient documentation

## 2012-02-08 DIAGNOSIS — K219 Gastro-esophageal reflux disease without esophagitis: Secondary | ICD-10-CM | POA: Insufficient documentation

## 2012-02-08 DIAGNOSIS — T23239A Burn of second degree of unspecified multiple fingers (nail), not including thumb, initial encounter: Secondary | ICD-10-CM | POA: Insufficient documentation

## 2012-02-08 MED ORDER — HYDROCODONE-ACETAMINOPHEN 5-325 MG PO TABS
1.0000 | ORAL_TABLET | Freq: Once | ORAL | Status: AC
  Administered 2012-02-08: 1 via ORAL
  Filled 2012-02-08: qty 1

## 2012-02-08 MED ORDER — CEPHALEXIN 500 MG PO CAPS: 500.0000 mg | ORAL_CAPSULE | Freq: Four times a day (QID) | ORAL | Status: AC

## 2012-02-08 NOTE — ED Notes (Signed)
Pt states that he began to experience muscular pain in his right chest region when he lifts objects or exerts himself.  Pt states that he experiences epigastric pain when attempting to perform ADL's.
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
## 2012-02-09 NOTE — ED Provider Notes (Signed)
Medical screening examination/treatment/procedure(s) were performed by non-physician practitioner and as supervising physician I was immediately available for consultation/collaboration.  Flint Melter, MD 02/09/12 1236

## 2012-02-13 ENCOUNTER — Other Ambulatory Visit: Payer: Self-pay

## 2012-02-13 ENCOUNTER — Emergency Department (HOSPITAL_COMMUNITY): Payer: Medicaid Other

## 2012-02-13 ENCOUNTER — Encounter (HOSPITAL_COMMUNITY): Payer: Self-pay | Admitting: Emergency Medicine

## 2012-02-13 ENCOUNTER — Emergency Department (HOSPITAL_COMMUNITY)
Admission: EM | Admit: 2012-02-13 | Discharge: 2012-02-13 | Disposition: A | Discharge status: Home / Self Care | Payer: Medicaid Other | Attending: Emergency Medicine | Admitting: Emergency Medicine

## 2012-02-13 DIAGNOSIS — R5381 Other malaise: Secondary | ICD-10-CM | POA: Insufficient documentation

## 2012-02-13 DIAGNOSIS — E785 Hyperlipidemia, unspecified: Secondary | ICD-10-CM | POA: Insufficient documentation

## 2012-02-13 DIAGNOSIS — K219 Gastro-esophageal reflux disease without esophagitis: Secondary | ICD-10-CM | POA: Insufficient documentation

## 2012-02-13 DIAGNOSIS — F3289 Other specified depressive episodes: Secondary | ICD-10-CM | POA: Insufficient documentation

## 2012-02-13 DIAGNOSIS — I10 Essential (primary) hypertension: Secondary | ICD-10-CM | POA: Insufficient documentation

## 2012-02-13 DIAGNOSIS — R0789 Other chest pain: Secondary | ICD-10-CM

## 2012-02-13 DIAGNOSIS — R5383 Other fatigue: Secondary | ICD-10-CM | POA: Insufficient documentation

## 2012-02-13 DIAGNOSIS — R071 Chest pain on breathing: Secondary | ICD-10-CM | POA: Insufficient documentation

## 2012-02-13 DIAGNOSIS — F329 Major depressive disorder, single episode, unspecified: Secondary | ICD-10-CM | POA: Insufficient documentation

## 2012-02-13 DIAGNOSIS — F259 Schizoaffective disorder, unspecified: Secondary | ICD-10-CM | POA: Insufficient documentation

## 2012-02-13 MED ORDER — OXYCODONE-ACETAMINOPHEN 5-325 MG PO TABS
1.0000 | ORAL_TABLET | Freq: Once | ORAL | Status: AC
  Administered 2012-02-13: 1 via ORAL
  Filled 2012-02-13: qty 1

## 2012-02-13 MED ORDER — OXYCODONE-ACETAMINOPHEN 5-325 MG PO TABS: 1.0000 | ORAL_TABLET | ORAL | Status: DC | PRN

## 2012-02-13 MED ORDER — DIAZEPAM 5 MG PO TABS: 5.0000 mg | ORAL_TABLET | Freq: Four times a day (QID) | ORAL | Status: DC | PRN

## 2012-02-13 MED ORDER — DIAZEPAM 5 MG PO TABS
5.0000 mg | ORAL_TABLET | Freq: Once | ORAL | Status: AC
  Administered 2012-02-13: 5 mg via ORAL
  Filled 2012-02-13: qty 1

## 2012-02-13 NOTE — ED Notes (Signed)
No sob, pain rt anterior chest on and off x 5 days, pain increases with palpation and deep inspirations.

## 2012-02-13 NOTE — ED Notes (Signed)
ZOX:[id number]<AV> Expected date:02/13/12<BR> Expected time:10:49 AM<BR> Means of arrival:Ambulance<BR> Comments:<BR> SOB

## 2012-02-13 NOTE — Discharge Instructions (Signed)
Use heat on the sore areas 3-4 times a day. See your doctor as needed for problems.  Chest Pain (Nonspecific) It is often hard to give a specific diagnosis for the cause of chest pain. There is always a chance that your pain could be related to something serious, such as a heart attack or a blood clot in the lungs. You need to follow up with your caregiver for further evaluation. CAUSES   Heartburn.   Pneumonia or bronchitis.   Anxiety and stress.   Inflammation around your heart (pericarditis) or lung (pleuritis or pleurisy).   A blood clot in the lung.   A collapsed lung (pneumothorax). It can develop suddenly on its own (spontaneous pneumothorax) or from injury (trauma) to the chest.  The chest wall is composed of bones, muscles, and cartilage. Any of these can be the source of the pain.  The bones can be bruised by injury.   The muscles or cartilage can be strained by coughing or overwork.   The cartilage can be affected by inflammation and become sore (costochondritis).  DIAGNOSIS  Lab tests or other studies, such as X-rays, an EKG, stress testing, or cardiac imaging, may be needed to find the cause of your pain.  TREATMENT   Treatment depends on what may be causing your chest pain. Treatment may include:   Acid blockers for heartburn.   Anti-inflammatory medicine.   Pain medicine for inflammatory conditions.   Antibiotics if an infection is present.   You may be advised to change lifestyle habits. This includes stopping smoking and avoiding caffeine and chocolate.   You may be advised to keep your head raised (elevated) when sleeping. This reduces the chance of acid going backward from your stomach into your esophagus.   Most of the time, nonspecific chest pain will improve within 2 to 3 days with rest and mild pain medicine.  HOME CARE INSTRUCTIONS   If antibiotics were prescribed, take the full amount even if you start to feel better.   For the next few days,  avoid physical activities that bring on chest pain. Continue physical activities as directed.   Do not smoke cigarettes or drink alcohol until your symptoms are gone.   Only take over-the-counter or prescription medicine for pain, discomfort, or fever as directed by your caregiver.   Follow your caregiver's suggestions for further testing if your chest pain does not go away.   Keep any follow-up appointments you made. If you do not go to an appointment, you could develop lasting (chronic) problems with pain. If there is any problem keeping an appointment, you must call to reschedule.  SEEK MEDICAL CARE IF:   You think you are having problems from the medicine you are taking. Read your medicine instructions carefully.   Your chest pain does not go away, even after treatment.   You develop a rash with blisters on your chest.  SEEK IMMEDIATE MEDICAL CARE IF:   You have increased chest pain or pain that spreads to your arm, neck, jaw, back, or belly (abdomen).   You develop shortness of breath, an increasing cough, or you are coughing up blood.   You have severe back or abdominal pain, feel sick to your stomach (nauseous) or throw up (vomit).   You develop severe weakness, fainting, or chills.   You have an oral temperature above 102 F (38.9 C), not controlled by medicine.  THIS IS AN EMERGENCY. Do not wait to see if the pain will go away.  Get medical help at once. Call your local emergency services (911 in [place] Do not drive yourself to the hospital. MAKE SURE YOU:   Understand these instructions.   Will watch your condition.   Will get help right away if you are not doing well or get worse.  Document Released: 09/12/2005 Document Revised: 08/15/2011 Document Reviewed: 07/08/2008 [facility] Patient Information 2012 [place], [place].Chest Wall Pain Chest wall pain is pain in or around the bones and muscles of your chest. This may occur:   On its own (spontaneously).   After a  viral illness such as the flu.   Through injur.   From coughing.   Minor exercise.  It may take up to 6 weeks to get better; longer if you must stay physically active in your work and activities. HOME CARE INSTRUCTIONS   Avoid over-tiring physical activity. Try not to strain or perform activities which cause pain. This would include any activities using chest, belly (abdominal) and side muscles, especially if heavy weights are used.   Use ice on the painful area for 15 to 20 minutes per hour while awake for the first 2 days. Place the ice in a plastic bag and place a towel between the bag of ice and your skin.   Only take over-the-counter or prescription medicines for pain, discomfort, or fever as directed by your caregiver.  SEEK IMMEDIATE MEDICAL CARE IF:   Your pain increases or you are very uncomfortable.   An oral temperature above 102 F (38.9 C)develops.   Your chest pains become worse.   You develop new, unexplained problems (symptoms).   You develop nausea, vomiting, sweating or feel light headed.   You develop a cough which produces phlegm (sputum) or you cough up blood.  MAKE SURE YOU:   Understand these instructions.   Will watch your condition.   Will get help right away if you are not doing well or get worse.  Document Released: 12/03/2005 Document Revised: 06/18/2011 Document Reviewed: 07/21/2008 [facility] Patient Information 2012 [place], [place].

## 2012-02-13 NOTE — ED Provider Notes (Signed)
History     CSN: [id number]  Arrival date & time 02/13/12  1111   First MD Initiated Contact with Patient 02/13/12 1154      Chief Complaint  Patient presents with  . Pleurisy    pain with palpation and deep breath, rt anterior chest    (Consider location/radiation/quality/duration/timing/severity/associated sxs/prior treatment) HPI Comments: [name] is a 56 y.o. Male who has had chest wall pain causing problems moving about for two days. He was hospitalized last week for a seizure and syncope. He had a cardiac stress test that was negative. Patient reports she was eating well until today. He feels like he has muscle spasms in his chest. He denies fever, chills, nausea, vomiting, shortness of breath or chest pain. He has not tried anything for the discomfort. Deep breathing makes it worse.  The history is provided by the patient.    Past Medical History  Diagnosis Date  . Seizures   . Hypertension   . GERD (gastroesophageal reflux disease)   . Dyslipidemia   . Schizoaffective disorder   . Depression     Past Surgical History  Procedure Date  . Self orchectomy   . Colonoscopy 2010  . Orif shoulder fracture     Family History  Problem Relation Age of Onset  . Stroke Father     Living at 33  . Stroke Mother     Stroke in late 30's. Died in her 57's    History  Substance Use Topics  . Smoking status: Never Smoker   . Smokeless tobacco: Never Used  . Alcohol Use: No      Review of Systems  All other systems reviewed and are negative.    Allergies  Sulfa antibiotics and Sulfonamide derivatives  Home Medications   Current Outpatient Rx  Name Route Sig Dispense Refill  . ACETAMINOPHEN 325 MG PO TABS Oral Take 650 mg by mouth every 6 (six) hours as needed. For pain    . ASPIRIN EC 81 MG PO TBEC Oral Take 81 mg by mouth daily.    . CEPHALEXIN 500 MG PO CAPS Oral Take 1 capsule (500 mg total) by mouth 4 (four) times daily. 28 capsule 0  . CLONAZEPAM 2  MG PO TABS Oral Take 1 tablet (2 mg total) by mouth at bedtime. Take 2 & 1/2 tabs by mouth twice a day.  Fill on or after 08/18/11 30 tablet 5  . DOXEPIN HCL 150 MG PO CAPS Oral Take 150 mg by mouth at bedtime. 6 tabs for 900 mg dosage    . IBUPROFEN 200 MG PO TABS Oral Take 400 mg by mouth every 6 (six) hours as needed. For pain    . METOPROLOL SUCCINATE ER 200 MG PO TB24 Oral Take 200 mg by mouth daily.     . ADULT MULTIVITAMIN W/MINERALS CH Oral Take 1 tablet by mouth daily.    Marland Kitchen OMEPRAZOLE 20 MG PO CPDR Oral Take 20 mg by mouth daily.    Marland Kitchen PHENYTOIN 50 MG PO CHEW Oral Chew 200 mg by mouth daily.    Marland Kitchen POLYETHYLENE GLYCOL 3350 PO PACK Oral Take 17 g by mouth daily as needed. For constipation. Mix into 8 ounces of fluid & drink    . QUETIAPINE FUMARATE 300 MG PO TABS Oral Take 900 mg by mouth at bedtime. Take 3 tabs by mouth each day    . SIMVASTATIN 40 MG PO TABS Oral Take 40 mg by mouth at bedtime.    Marland Kitchen  TESTOSTERONE CYPIONATE 100 MG/ML IM OIL Intramuscular Inject 4 mLs (400 mg total) into the muscle every 14 (fourteen) days. 10 mL 3  . DIAZEPAM 5 MG PO TABS Oral Take 1 tablet (5 mg total) by mouth every 6 (six) hours as needed for anxiety (spasms). 10 tablet 0  . OXYCODONE-ACETAMINOPHEN 5-325 MG PO TABS Oral Take 1 tablet by mouth every 4 (four) hours as needed for pain. 15 tablet 0    BP 129/82  Pulse 72  Temp(Src) 98 F (36.7 C) (Oral)  Resp 12  SpO2 97%  Physical Exam  Nursing note and vitals reviewed. Constitutional: He is oriented to person, place, and time. He appears well-developed and well-nourished.  HENT:  Head: Normocephalic and atraumatic.  Right Ear: External ear normal.  Left Ear: External ear normal.  Eyes: Conjunctivae and EOM are normal. Pupils are equal, round, and reactive to light.  Neck: Normal range of motion and phonation normal. Neck supple.  Cardiovascular: Normal rate, regular rhythm, normal heart sounds and intact distal pulses.   Pulmonary/Chest: Effort  normal and breath sounds normal. He exhibits tenderness (Moderate diffuse anterior chest wall tenderness). He exhibits no bony tenderness.  Abdominal: Soft. Normal appearance. There is no tenderness.  Musculoskeletal: Normal range of motion.  Neurological: He is alert and oriented to person, place, and time. He has normal strength. No cranial nerve deficit or sensory deficit. He exhibits normal muscle tone. Coordination normal.  Skin: Skin is warm, dry and intact.  Psychiatric: His behavior is normal. Judgment and thought content normal.       He is anxious    ED Course  Procedures (including critical care time)  Emergency department treatment: Oral Valium and Percocet.   Date: 02/13/2012  Rate: 83  Rhythm: normal sinus rhythm  QRS Axis:Left axis deviation  Intervals: normal  ST/T Wave abnormalities: normal  Conduction Disutrbances:none  Narrative Interpretation:   Old EKG Reviewed: none available    Labs Reviewed - No data to display Dg Chest 2 View  02/13/2012  *RADIOLOGY REPORT*  Clinical Data: Weakness and generalized pain.  CHEST - 2 VIEW  Comparison: 01/30/2012 and 10/25/2010.  Findings: Trachea is midline.  Heart size normal. Right upper lobe and left basilar scarring.  Lungs are otherwise low in volume but clear.  No pleural fluid.  IMPRESSION: No acute findings.  Original Report Authenticated By: Reyes Ivan, M.D.   3:04 PM Reevaluation with update and discussion. After initial assessment and treatment, an updated evaluation reveals pt feels better. [name]   1. Chest wall pain       MDM  Evaluation consistent with chest wall pain. Doubt ACS, pneumonia, PE, metabolic instability, or serious bacterial infection       Flint Melter, MD 02/13/12 [phone]

## 2012-02-20 ENCOUNTER — Emergency Department (HOSPITAL_COMMUNITY)
Admission: EM | Admit: 2012-02-20 | Discharge: 2012-02-21 | Disposition: A | Discharge status: Home / Self Care | Payer: Medicaid Other | Attending: Emergency Medicine | Admitting: Emergency Medicine

## 2012-02-20 ENCOUNTER — Encounter (HOSPITAL_COMMUNITY): Payer: Self-pay | Admitting: *Deleted

## 2012-02-20 ENCOUNTER — Emergency Department (HOSPITAL_COMMUNITY): Payer: Medicaid Other

## 2012-02-20 DIAGNOSIS — R0789 Other chest pain: Secondary | ICD-10-CM

## 2012-02-20 DIAGNOSIS — Z79899 Other long term (current) drug therapy: Secondary | ICD-10-CM | POA: Insufficient documentation

## 2012-02-20 DIAGNOSIS — G40909 Epilepsy, unspecified, not intractable, without status epilepticus: Secondary | ICD-10-CM | POA: Insufficient documentation

## 2012-02-20 DIAGNOSIS — I10 Essential (primary) hypertension: Secondary | ICD-10-CM | POA: Insufficient documentation

## 2012-02-20 DIAGNOSIS — R079 Chest pain, unspecified: Secondary | ICD-10-CM | POA: Insufficient documentation

## 2012-02-20 DIAGNOSIS — R071 Chest pain on breathing: Secondary | ICD-10-CM | POA: Insufficient documentation

## 2012-02-20 LAB — COMPREHENSIVE METABOLIC PANEL
ALT: 33 U/L (ref 0–53)
AST: 43 U/L — ABNORMAL HIGH (ref 0–37)
Albumin: 3.5 g/dL (ref 3.5–5.2)
Alkaline Phosphatase: 100 U/L (ref 39–117)
BUN: 18 mg/dL (ref 6–23)
CO2: 24 mEq/L (ref 19–32)
Calcium: 8.7 mg/dL (ref 8.4–10.5)
Chloride: 102 mEq/L (ref 96–112)
Creatinine, Ser: 0.92 mg/dL (ref 0.50–1.35)
GFR calc Af Amer: >90 mL/min (ref >90)
GFR calc non Af Amer: >90 mL/min (ref >90)
Glucose, Bld: 149 mg/dL — ABNORMAL HIGH (ref 70–99)
Potassium: 3.5 mEq/L (ref 3.5–5.1)
Sodium: 136 mEq/L (ref 135–145)
Total Bilirubin: 0.1 mg/dL — ABNORMAL LOW (ref 0.3–1.2)
Total Protein: 6.5 g/dL (ref 6.0–8.3)

## 2012-02-20 LAB — CARDIAC PANEL(CRET KIN+CKTOT+MB+TROPI)
CK, MB: 7.5 ng/mL (ref 0.3–4.0)
CK, MB: 6.1 ng/mL (ref 0.3–4.0)
Relative Index: 0.7 (ref 0.0–2.5)
Relative Index: 0.7 (ref 0.0–2.5)
Total CK: 1120 U/L — ABNORMAL HIGH (ref 7–232)
Total CK: 898 U/L — ABNORMAL HIGH (ref 7–232)
Troponin I: <0.3 ng/mL (ref <0.30)
Troponin I: <0.3 ng/mL (ref <0.30)

## 2012-02-20 LAB — D-DIMER, QUANTITATIVE: D-Dimer, Quant: 0.31 ug/mL-FEU (ref 0.00–0.48)

## 2012-02-20 LAB — DIFFERENTIAL
Basophils Absolute: 0 10*3/uL (ref 0.0–0.1)
Basophils Relative: 0 % (ref 0–1)
Eosinophils Absolute: 0.2 10*3/uL (ref 0.0–0.7)
Eosinophils Relative: 4 % (ref 0–5)
Lymphocytes Relative: 35 % (ref 12–46)
Lymphs Abs: 1.8 10*3/uL (ref 0.7–4.0)
Monocytes Absolute: 0.3 10*3/uL (ref 0.1–1.0)
Monocytes Relative: 5 % (ref 3–12)
Neutro Abs: 2.9 10*3/uL (ref 1.7–7.7)
Neutrophils Relative %: 56 % (ref 43–77)

## 2012-02-20 LAB — CBC
HCT: 36 % — ABNORMAL LOW (ref 39.0–52.0)
Hemoglobin: 12.5 g/dL — ABNORMAL LOW (ref 13.0–17.0)
MCH: 31.6 pg (ref 26.0–34.0)
MCHC: 34.7 g/dL (ref 30.0–36.0)
MCV: 90.9 fL (ref 78.0–100.0)
Platelets: 195 10*3/uL (ref 150–400)
RBC: 3.96 MIL/uL — ABNORMAL LOW (ref 4.22–5.81)
RDW: 13.3 % (ref 11.5–15.5)
WBC: 5.2 10*3/uL (ref 4.0–10.5)

## 2012-02-20 MED ORDER — KETOROLAC TROMETHAMINE 30 MG/ML IJ SOLN
30.0000 mg | Freq: Once | INTRAMUSCULAR | Status: AC
  Administered 2012-02-20: 30 mg via INTRAVENOUS
  Filled 2012-02-20: qty 1

## 2012-02-20 MED ORDER — SODIUM CHLORIDE 0.9 % IV BOLUS (SEPSIS): 1000.0000 mL | Freq: Once | INTRAVENOUS | Status: DC

## 2012-02-20 MED ORDER — SODIUM CHLORIDE 0.9 % IV BOLUS (SEPSIS)
1000.0000 mL | Freq: Once | INTRAVENOUS | Status: AC
  Administered 2012-02-20: 1000 mL via INTRAVENOUS

## 2012-02-20 MED ORDER — NAPROXEN 500 MG PO TABS: 500.0000 mg | ORAL_TABLET | Freq: Two times a day (BID) | ORAL | Status: DC

## 2012-02-20 NOTE — ED Notes (Signed)
Pt anxious. Holding his chest stating that when he holds pressure it makes it feel better. Pt has pain on palpation. Md at bedside. IV started.

## 2012-02-20 NOTE — ED Provider Notes (Signed)
History     CSN: [id number]  Arrival date & time 02/20/12  4098   First MD Initiated Contact with Patient 02/20/12 1931      Chief Complaint  Patient presents with  . Chest Pain    (Consider location/radiation/quality/duration/timing/severity/associated sxs/prior treatment) HPI Comments: Patient presents with right-sided chest wall pain he has been having constantly for the past 3 weeks. He admitted in February for a syncope versus seizure episode with positive cardiac enzymes. His workup was negative including a negative stress test. The constant chest pain since to palpation and worse with position change. Evaluated multiple times in ED for the same. Denies any change to the pain today. He is using Tylenol and Motrin without relief. No shortness of breath, nausea, diaphoresis, fever or chills. Denies any new injury.  The history is provided by the patient.    Past Medical History  Diagnosis Date  . Seizures   . Hypertension   . GERD (gastroesophageal reflux disease)   . Dyslipidemia   . Schizoaffective disorder   . Depression     Past Surgical History  Procedure Date  . Self orchectomy   . Colonoscopy 2010  . Orif shoulder fracture     Family History  Problem Relation Age of Onset  . Stroke Father     Living at 42  . Stroke Mother     Stroke in late 35's. Died in her 77's    History  Substance Use Topics  . Smoking status: Never Smoker   . Smokeless tobacco: Never Used  . Alcohol Use: No      Review of Systems  Constitutional: Negative for fever, activity change and appetite change.  HENT: Negative for congestion and rhinorrhea.   Respiratory: Positive for chest tightness. Negative for cough and shortness of breath.   Cardiovascular: Positive for chest pain.  Gastrointestinal: Negative for nausea, vomiting and abdominal pain.  Genitourinary: Negative for dysuria and hematuria.  Musculoskeletal: Negative for back pain.  Neurological: Negative for  light-headedness and headaches.    Allergies  Sulfa antibiotics and Sulfonamide derivatives  Home Medications   Current Outpatient Rx  Name Route Sig Dispense Refill  . ACETAMINOPHEN 325 MG PO TABS Oral Take 650 mg by mouth every 6 (six) hours as needed. For pain    . ASPIRIN EC 81 MG PO TBEC Oral Take 81 mg by mouth daily.    Marland Kitchen CLONAZEPAM 2 MG PO TABS Oral Take 5 mg by mouth 2 (two) times daily.    Marland Kitchen DOXEPIN HCL 150 MG PO CAPS Oral Take 900 mg by mouth at bedtime. 6 tabs for 900 mg dosage    . IBUPROFEN 200 MG PO TABS Oral Take 400 mg by mouth every 6 (six) hours as needed. For pain    . METOPROLOL SUCCINATE ER 200 MG PO TB24 Oral Take 200 mg by mouth daily.     . ADULT MULTIVITAMIN W/MINERALS CH Oral Take 1 tablet by mouth daily.    Marland Kitchen OMEPRAZOLE 20 MG PO CPDR Oral Take 20 mg by mouth daily as needed. Heartburns    . PHENYTOIN 50 MG PO CHEW Oral Chew 200 mg by mouth daily.    Marland Kitchen POLYETHYLENE GLYCOL 3350 PO PACK Oral Take 17 g by mouth daily as needed. For constipation. Mix into 8 ounces of fluid & drink    . QUETIAPINE FUMARATE 300 MG PO TABS Oral Take 900 mg by mouth at bedtime. Take 3 tabs by mouth each day    .  SIMVASTATIN 40 MG PO TABS Oral Take 40 mg by mouth at bedtime.    . TESTOSTERONE CYPIONATE 100 MG/ML IM OIL Intramuscular Inject 4 mLs (400 mg total) into the muscle every 14 (fourteen) days. 10 mL 3  . NAPROXEN 500 MG PO TABS Oral Take 1 tablet (500 mg total) by mouth 2 (two) times daily. 30 tablet 0    BP 127/72  Pulse 81  Temp(Src) 98.4 F (36.9 C) (Oral)  Resp 18  Wt 170 lb (77.111 kg)  SpO2 100%  Physical Exam  Constitutional: He is oriented to person, place, and time. He appears well-developed and well-nourished. No distress.  HENT:  Head: Normocephalic and atraumatic.  Mouth/Throat: Oropharynx is clear and moist. No oropharyngeal exudate.  Eyes: Conjunctivae are normal. Pupils are equal, round, and reactive to light.  Neck: Normal range of motion. Neck supple.   Cardiovascular: Normal rate, regular rhythm and normal heart sounds.   No murmur heard. Pulmonary/Chest: Effort normal and breath sounds normal. He exhibits tenderness.       Reproducible right-sided chest wall tenderness.  Abdominal: Soft. There is no tenderness. There is no rebound and no guarding.  Musculoskeletal: Normal range of motion. He exhibits no edema and no tenderness.  Neurological: He is alert and oriented to person, place, and time. No cranial nerve deficit.  Skin: Skin is warm.    ED Course  Procedures (including critical care time)  Labs Reviewed  CBC - Abnormal; Notable for the following:    RBC 3.96 (*)    Hemoglobin 12.5 (*)    HCT 36.0 (*)    All other components within normal limits  COMPREHENSIVE METABOLIC PANEL - Abnormal; Notable for the following:    Glucose, Bld 149 (*)    AST 43 (*) NO VISIBLE HEMOLYSIS   Total Bilirubin 0.1 (*)    All other components within normal limits  CARDIAC PANEL(CRET KIN+CKTOT+MB+TROPI) - Abnormal; Notable for the following:    Total CK 1120 (*)    CK, MB 7.5 (*)    All other components within normal limits  CARDIAC PANEL(CRET KIN+CKTOT+MB+TROPI) - Abnormal; Notable for the following:    Total CK 898 (*)    CK, MB 6.1 (*) CRITICAL VALUE NOTED.  VALUE IS CONSISTENT WITH PREVIOUSLY REPORTED AND CALLED VALUE.   All other components within normal limits  DIFFERENTIAL  D-DIMER, QUANTITATIVE   Dg Chest 2 View  02/20/2012  *RADIOLOGY REPORT*  Clinical Data: Chest pain.  Fall 2 weeks ago  CHEST - 2 VIEW  Comparison: 02/13/2012  Findings: Improved aeration in the lung bases.  Improved lung volume and improvement in bibasilar atelectasis.  Negative for pneumonia.  Negative for heart failure or effusion.  IMPRESSION: Improved aeration.  There remains mild bibasilar atelectasis.  Original Report Authenticated By: Camelia Phenes, M.D.     1. Chest wall pain       MDM  Chest wall pain is constant for the past month. No associated  symptoms. Very low suspicion for ACS. Patient had negative stress test in February.  Reproducible chest wall pain similar to previous. Chest x-ray negative. EKG unchanged from previous. Recent negative stress test. Troponin negative, downtrending CKs.  Drop in Hb 12.5 from 15.  FOBT neg.  Follow up with PCP.   Date: 02/20/2012  Rate: 86  Rhythm: normal sinus rhythm  QRS Axis: left  Intervals: normal  ST/T Wave abnormalities: nonspecific ST/T changes  Conduction Disutrbances:left anterior fascicular block  Narrative Interpretation:   Old EKG  Reviewed: unchanged         Glynn Octave, MD 02/21/12 [phone]

## 2012-02-20 NOTE — ED Notes (Signed)
Pt back from radiology. Visible from nursing station

## 2012-02-20 NOTE — ED Notes (Signed)
Pt states "I was here, had false positive enzymes, I fell, I think that's what happened, I had a sz and burned my fingers on the heater, they sent me to [facility], I have not made an appt with my doctor, the chest pain has not gone away"

## 2012-02-21 LAB — CARDIAC PANEL(CRET KIN+CKTOT+MB+TROPI)
CK, MB: 5.2 ng/mL — ABNORMAL HIGH (ref 0.3–4.0)
Relative Index: 0.7 (ref 0.0–2.5)
Total CK: 716 U/L — ABNORMAL HIGH (ref 7–232)
Troponin I: <0.3 ng/mL (ref <0.30)

## 2012-02-21 NOTE — ED Notes (Signed)
Pt stable. Ready for d/c. Prescriptions given. Verbalizes understanding.

## 2012-02-25 ENCOUNTER — Other Ambulatory Visit: Payer: Self-pay

## 2012-02-25 ENCOUNTER — Encounter: Payer: Self-pay | Admitting: Family Medicine

## 2012-02-25 ENCOUNTER — Ambulatory Visit (HOSPITAL_COMMUNITY)
Admission: RE | Admit: 2012-02-25 | Discharge: 2012-02-25 | Disposition: A | Discharge status: Home / Self Care | Payer: Medicaid Other | Source: Ambulatory Visit | Attending: Family Medicine | Admitting: Family Medicine

## 2012-02-25 ENCOUNTER — Ambulatory Visit (INDEPENDENT_AMBULATORY_CARE_PROVIDER_SITE_OTHER): Payer: Medicaid Other | Admitting: Family Medicine

## 2012-02-25 DIAGNOSIS — R9431 Abnormal electrocardiogram [ECG] [EKG]: Secondary | ICD-10-CM | POA: Insufficient documentation

## 2012-02-25 DIAGNOSIS — R079 Chest pain, unspecified: Secondary | ICD-10-CM

## 2012-02-25 LAB — D-DIMER, QUANTITATIVE: D-Dimer, Quant: 0.69 ug/mL-FEU — ABNORMAL HIGH (ref 0.00–0.48)

## 2012-02-25 MED ORDER — MELOXICAM 15 MG PO TABS: 15.0000 mg | ORAL_TABLET | Freq: Every day | ORAL | Status: DC

## 2012-02-25 MED ORDER — HYDROCODONE-ACETAMINOPHEN 5-500 MG PO TABS: 1.0000 | ORAL_TABLET | Freq: Three times a day (TID) | ORAL | Status: AC | PRN

## 2012-02-25 NOTE — Patient Instructions (Addendum)
Thank you for coming in today. I will call after the blood test results come back. If we need to order a CT scan we can. See me in one week or sooner. Go to emergency room if you has trouble breathing or your pain gets dramatically worse.

## 2012-02-25 NOTE — Progress Notes (Signed)
[name] is a 56 y.o. male who presents to [facility] today for right-sided chest pain present for around 3 weeks now. This occurred after his hospitalization. He's been evaluated in the emergency room with chest x-ray and EKG which were nondiagnostic. He has not had any further trauma to his right chest since the initial hospitalization. His pain slowly worsened and was very bad yesterday. He denies any trouble breathing but does note pain with deep breaths. He notes the pain is sharp right-sided stabbing and nonexertional or associated with food.  He denies any trouble eating.   PMH reviewed. Significant for schizophrenia, seizure disorder ROS as above otherwise neg Medications reviewed. Current Outpatient Prescriptions  Medication Sig Dispense Refill  . acetaminophen (TYLENOL) 325 MG tablet Take 650 mg by mouth every 6 (six) hours as needed. For pain      . aspirin EC 81 MG tablet Take 81 mg by mouth daily.      . clonazePAM (KLONOPIN) 2 MG tablet Take 5 mg by mouth 2 (two) times daily.      Marland Kitchen doxepin (SINEQUAN) 150 MG capsule Take 900 mg by mouth at bedtime. 6 tabs for 900 mg dosage      . ibuprofen (ADVIL,MOTRIN) 200 MG tablet Take 400 mg by mouth every 6 (six) hours as needed. For pain      . metoprolol (TOPROL-XL) 200 MG 24 hr tablet Take 200 mg by mouth daily.       . Multiple Vitamin (MULITIVITAMIN WITH MINERALS) TABS Take 1 tablet by mouth daily.      . naproxen (NAPROSYN) 500 MG tablet Take 1 tablet (500 mg total) by mouth 2 (two) times daily.  30 tablet  0  . omeprazole (PRILOSEC) 20 MG capsule Take 20 mg by mouth daily as needed. Heartburns      . phenytoin (DILANTIN) 50 MG tablet Chew 200 mg by mouth daily.      . polyethylene glycol (MIRALAX / GLYCOLAX) packet Take 17 g by mouth daily as needed. For constipation. Mix into 8 ounces of fluid & drink      . QUEtiapine (SEROQUEL) 300 MG tablet Take 900 mg by mouth at bedtime. Take 3 tabs by mouth each day      . simvastatin (ZOCOR) 40 MG  tablet Take 40 mg by mouth at bedtime.      Marland Kitchen testosterone cypionate (DEPOTESTOTERONE CYPIONATE) 100 MG/ML injection Inject 4 mLs (400 mg total) into the muscle every 14 (fourteen) days.  10 mL  3  . HYDROcodone-acetaminophen (VICODIN) 5-500 MG per tablet Take 1 tablet by mouth every 8 (eight) hours as needed for pain.  20 tablet  0  . meloxicam (MOBIC) 15 MG tablet Take 1 tablet (15 mg total) by mouth daily.  15 tablet  0    Exam:  BP 121/81  Pulse 82  Ht 5\' 11"  (1.803 m)  Wt 186 lb (84.369 kg)  BMI 25.94 kg/m2 Gen: Well NAD HEENT: EOMI,  MMM Lungs: CTABL Nl WOB Heart: RRR no MRG Chest: Tender to palpation her right chest wall. No crepitations present. Abd: NABS, NT, ND Exts: Non edematous BL  LE, warm and well perfused.   EKG: Sinus rhythm at 79 beats per minute. QTC 440. T waves are flattened throughout no ST segment abnormalities.

## 2012-02-25 NOTE — Assessment & Plan Note (Signed)
Right-sided chest pain without clear explanation present for 3 weeks.  Pain appears to be significant.  I am concerned for PE, occult rib or sternal fracture, or costochondritis.  His well score is 1.5 low risk.  Therefore after discussion with the patient we feel that a d-dimer initially is warranted.  If the d-dimer is positive we'll proceed with a chest CT angiogram with contrast.  If the d-dimer is negative we will followup in one week.  Discussed warning signs that would prompt return to clinic.  Patient expresses understanding. Total visit time was over 20 minute

## 2012-02-26 ENCOUNTER — Telehealth: Payer: Self-pay | Admitting: Family Medicine

## 2012-02-26 ENCOUNTER — Telehealth (HOSPITAL_COMMUNITY): Payer: Self-pay | Admitting: Family Medicine

## 2012-02-26 NOTE — Telephone Encounter (Signed)
Forward to PCP. [name]  

## 2012-02-26 NOTE — Telephone Encounter (Signed)
Patient is calling back again.  Now he has thought about it, looked it up on the internet and it said it is one of the highest causes of death. " Since he has been waiting to die for 20 years, this is his opportunity."  He now is saying he does not want the CT Scan scheduled.

## 2012-02-26 NOTE — Telephone Encounter (Signed)
Called patient back about elevated D-Dimer. Discussed what this means with the patent.   Put in an order for CT-angiogram of the chest. Will route to the red team.

## 2012-02-26 NOTE — Telephone Encounter (Signed)
Attempted to call the patient back regarding his decisions. I am unable to reach him at this time. He was asking coherent and thoughtful questions this morning. At no point during my care has Mr Lehenbauer ever lacked capacity to make decisions. I do not feel that his statement of want to die is actively suicidal. I feel he has a right to make this decision. I spoke with Dr. Leveda Anna about this matter and he agrees. I will continue to try to contact the patient.

## 2012-02-26 NOTE — Telephone Encounter (Signed)
Patient would like to speak to Dr. Denyse Amass about other options before scheduling the CT Scan.

## 2012-02-28 NOTE — Telephone Encounter (Signed)
Contacted patient this morning.  He is feeling a bit better, he would like to hold off on the chest CT scan.  I advised that I would recommend he proceed with a CT angiogram of the chest but can understand if he does not want the test done.  He will followup with me in 1-2 weeks sooner if worsening. He will go to the emergency room for difficulty breathing or extreme chest pain.  He expresses understanding.

## 2012-03-17 ENCOUNTER — Telehealth: Payer: Self-pay | Admitting: *Deleted

## 2012-03-17 ENCOUNTER — Ambulatory Visit (INDEPENDENT_AMBULATORY_CARE_PROVIDER_SITE_OTHER): Payer: Medicaid Other | Admitting: Family Medicine

## 2012-03-17 DIAGNOSIS — R079 Chest pain, unspecified: Secondary | ICD-10-CM

## 2012-03-17 LAB — D-DIMER, QUANTITATIVE: D-Dimer, Quant: 0.53 ug/mL-FEU — ABNORMAL HIGH (ref 0.00–0.48)

## 2012-03-17 NOTE — Telephone Encounter (Signed)
Patient comes to office approx 11:30 AM stating he has had chest pain for 6 weeks. Was actually hospitalized and everything checked out fine with his heart .  Echo, Stress test etc.  He has continued having chest pain. States he had a seizure at some early point in this process and he thinks this may have a relation to chest discomfort  also.  Seems to hurt when he moves . However a doctor that he has spoken with outside of the office has suggested he needs to see a specialist because of continued chest pain and elevated enzyme test.  The specialist is a pulmonologist. Concerned he may have a PE.Marland Kitchen  Appointment scheduled this afternoon to discuss this with MD.

## 2012-03-17 NOTE — Telephone Encounter (Signed)
Called pt. Got disconnected. appt for ct angio chest 03-19-12 at 11:45 am [facility] [address] location.  Will get PA and call him back. Lorenda Hatchet, [name]  .Marland Kitchen

## 2012-03-17 NOTE — Progress Notes (Signed)
  Subjective:    Patient ID: [name], male    DOB: [birth year], 57 y.o.   MRN: [id number]  HPI Patient presents today with chief complaint of chest pain. Patient has had a relatively protracted history of this with noted recent admission in February for chest pain status post seizure. Patient was noted have elevated troponins at that time to which pt had a formal cardiology evaluation. Patient had a negative stress test as well as a relatively normal echocardiogram. Working diagnoses for elevated troponins with secondary to traumatic injury. Patient was seen by Dr. Denyse Amass on March 11 for persistence of chest pain. A working diagnosis of costochondritis was established however patient did have him mildly elevated well score of 1.5. A d-dimer was done at the time showing a value of 0.68. Patient was counseled on getting a CT angiogram to rule out a pulmonary embolus. Patient was initially agreeable to this however patient has since changed his mind of concern for radiation exposure. Today patient comes in with complaint of persistence of chest pain. Chest pain morphology as well as intensity is essentially unchanged last clinical visit. Patient states the chest pain is most significant with coughing as well as deep breathing and general upper extremity movement. Patient denies any increased work of breathing or general shortness of breath. Pain has been mildly controlled with tylenol and NSAIDs.    Review of Systems See HPI, otherwise ROS negative    Objective:   Physical Exam Gen: up in chair, NAD, generally dishevled appearing.  HEENT: NCAT, EOMI, TMs clear bilaterally CV: RRR, no murmurs auscultated CHEST: marked anterior chest wall tenderness, pain with deep breathing, pain with internal/external rotation of upper extremities.  PULM: CTAB, no wheezes, rales, rhoncii ABD: S/NT/+ bowel sounds  EXT: 2+ peripheral pulses   Assessment & Plan:

## 2012-03-17 NOTE — Assessment & Plan Note (Addendum)
Had a relatively lengthy discussion with patient about overall chest pain. Discussed with patient that overall symptoms are highly consistent with costochondritis however given the patient does have a mildly positive d-dimer we are relatively obligated to perform imagingto rule out a more significant cough chest pain. Also discussed with patient that this form of imaging may give good insight into a bony inflammation/fractures.  Patient is agreeable to getting a repeat d-dimer today and would consider imaging if this was positive. From a pain standpoint, discussed with patient using  Tylenol and advil at high-doses in alternating fashion to help with pain.

## 2012-03-17 NOTE — Patient Instructions (Signed)
It was good to each today I think he getting a CT angiogram of her chest the best option to rule out any significant causes of her chest pain Continue using ibuprofen and Tylenol for your chest pain Followup with Dr. Denyse Amass next week If your chest pain significantly worsens call us or go to the emergency room Call if any questions God Bless,  Doree Albee MD

## 2012-03-18 ENCOUNTER — Telehealth: Payer: Self-pay | Admitting: *Deleted

## 2012-03-18 NOTE — Telephone Encounter (Signed)
Called pt again to inform of appt. No answer. [name], [name]

## 2012-03-18 NOTE — Telephone Encounter (Signed)
Called pt again and left detailed message with appt info for tomorrow. [name], [name]

## 2012-03-19 ENCOUNTER — Telehealth: Payer: Self-pay | Admitting: Family Medicine

## 2012-03-19 ENCOUNTER — Ambulatory Visit
Admission: RE | Admit: 2012-03-19 | Discharge: 2012-03-19 | Disposition: A | Discharge status: Home / Self Care | Payer: Medicaid Other | Source: Ambulatory Visit | Attending: Family Medicine | Admitting: Family Medicine

## 2012-03-19 ENCOUNTER — Telehealth: Payer: Self-pay | Admitting: *Deleted

## 2012-03-19 ENCOUNTER — Other Ambulatory Visit: Payer: Medicaid Other

## 2012-03-19 ENCOUNTER — Other Ambulatory Visit: Payer: Self-pay | Admitting: Family Medicine

## 2012-03-19 MED ORDER — IOHEXOL 350 MG/ML SOLN
100.0000 mL | Freq: Once | INTRAVENOUS | Status: AC | PRN
  Administered 2012-03-19: 100 mL via INTRAVENOUS

## 2012-03-19 MED ORDER — LEVOFLOXACIN 750 MG PO TABS: 750.0000 mg | ORAL_TABLET | Freq: Every day | ORAL | Status: AC

## 2012-03-19 MED ORDER — TESTOSTERONE CYPIONATE 100 MG/ML IM SOLN: 400.0000 mg | INTRAMUSCULAR | Status: DC

## 2012-03-19 NOTE — Telephone Encounter (Signed)
Patient called to confirm appointment for CT today. Appointment information was given.Jammy Plotkin, [name]

## 2012-03-19 NOTE — Telephone Encounter (Signed)
Called and discussed CT results with pt. Told pt that he has a pneumonia that needs to be treated and that there was no pulmonary emboli.  Discussed infectious and respiratory red flags.  Instructed to follow up with PCP next week.  Pt agreeable to plan.   -Will treat for HCAP given recent admission within last 90 days.

## 2012-03-21 ENCOUNTER — Other Ambulatory Visit: Payer: Self-pay | Admitting: Family Medicine

## 2012-03-21 ENCOUNTER — Telehealth: Payer: Self-pay | Admitting: Family Medicine

## 2012-03-21 MED ORDER — CLONAZEPAM 2 MG PO TABS: 5.0000 mg | ORAL_TABLET | Freq: Two times a day (BID) | ORAL | Status: DC

## 2012-03-21 NOTE — Telephone Encounter (Signed)
Clydie Braun from [facility] called to ask if it was ok to change pt's Testosterone cypionate (due to generic brand issues/insurance) to 200 MG/ML 2 mg every 14 days 10 cc vail. Laureen Ochs, Viann Shove

## 2012-03-23 NOTE — Telephone Encounter (Signed)
Ok to change. Please call pharmacy and give OK.

## 2012-03-24 NOTE — Telephone Encounter (Signed)
OK given to pharmacy.

## 2012-03-25 ENCOUNTER — Telehealth: Payer: Self-pay | Admitting: Family Medicine

## 2012-03-25 NOTE — Telephone Encounter (Signed)
Spoke with patient and explained that he was supposed to follow up this week . No appointment has been scheduled.  States the burning sensation has been an ongoing thing . Appointment scheduled tomorrow for follow up.

## 2012-03-25 NOTE — Telephone Encounter (Signed)
Pt has pneumonia and is still coughing a lot and has a burning sensation - states he is coughing up yellow sputum.  He has 2 more days of abx left and wants to know if this will be enough.

## 2012-03-26 ENCOUNTER — Ambulatory Visit (INDEPENDENT_AMBULATORY_CARE_PROVIDER_SITE_OTHER): Payer: Medicaid Other | Admitting: Emergency Medicine

## 2012-03-26 ENCOUNTER — Encounter: Payer: Self-pay | Admitting: Emergency Medicine

## 2012-03-26 VITALS — BP 137/79 | HR 82 | Temp 98.6°F | Ht 71.0 in | Wt 183.0 lb

## 2012-03-26 DIAGNOSIS — J189 Pneumonia, unspecified organism: Secondary | ICD-10-CM

## 2012-03-26 DIAGNOSIS — R079 Chest pain, unspecified: Secondary | ICD-10-CM

## 2012-03-26 NOTE — Patient Instructions (Signed)
It was nice to see you.  It sounds like things are getting better.  The cough may last for another month.  You can take the cough medicine as needed. The chest pain is likely caused by inflammation of the muscles and bones in the chest from coughing.  You can take ibuprofen 600mg  three times a day as needed for the next 3-4 days without hurting your kidneys.  If you develop a fever, the chest pain gets worse or changes, or you just do not continue to slowly improve please call the clinic for an appointment.  I would like you to follow up with Dr. Denyse Amass in about 1 month to see how things are going.

## 2012-03-26 NOTE — Assessment & Plan Note (Addendum)
Finished course of antibiotics.  Discussed that energy and cough will continue to improve over time.  Okay to take OTC cough medicine.  Red flags given as in AVS.  Patient asking about getting the pneumovax.  Will defer this to PCP as no clear indication for early pneumovax.

## 2012-03-26 NOTE — Progress Notes (Signed)
  Subjective:    Patient ID: [name], male    DOB: 1956/02/21, 56 y.o.   MRN: [id number]  HPI [name] is here for pneumonia follow up.  He was diagnosed with pneumonia last week by CTA (negative for PE) that was obtained due to a mildly elevated D-dimer and chest pain.  He has completed his course of levaquin.  Continues to have a cough productive of yellow-green sputum.  Continues to have diffuse anterior chest pain, worse on right sternal border, with cough and certain movements.  Was feeling very weak, but this is improving.  No fevers, chills, n/v/d.   SHx: never smoker  Review of Systems See HPI    Objective:   Physical Exam BP 137/79  Pulse 82  Temp(Src) 98.6 F (37 C) (Oral)  Ht 5\' 11"  (1.803 m)  Wt 183 lb (83.008 kg)  BMI 25.52 kg/m2  SpO2 98% Gen: alert, NAD CV: RRR, no murmurs Pulm: good air movement, normal WOB, CTAB, no rales or wheezes      Assessment & Plan:

## 2012-03-26 NOTE — Assessment & Plan Note (Signed)
Likely secondary to costochondritis vs muscle strain.  Discussed okay to take ibuprofen 600mg  TID for the next 3-4 days.

## 2012-04-17 ENCOUNTER — Other Ambulatory Visit: Payer: Self-pay | Admitting: Family Medicine

## 2012-04-25 ENCOUNTER — Emergency Department (HOSPITAL_COMMUNITY)
Admission: EM | Admit: 2012-04-25 | Discharge: 2012-04-25 | Disposition: A | Discharge status: Home / Self Care | Payer: Medicaid Other | Attending: Emergency Medicine | Admitting: Emergency Medicine

## 2012-04-25 ENCOUNTER — Emergency Department (HOSPITAL_COMMUNITY): Payer: Medicaid Other

## 2012-04-25 ENCOUNTER — Encounter (HOSPITAL_COMMUNITY): Payer: Self-pay | Admitting: *Deleted

## 2012-04-25 ENCOUNTER — Encounter: Payer: Self-pay | Admitting: Family Medicine

## 2012-04-25 ENCOUNTER — Other Ambulatory Visit: Payer: Self-pay

## 2012-04-25 ENCOUNTER — Ambulatory Visit (INDEPENDENT_AMBULATORY_CARE_PROVIDER_SITE_OTHER): Payer: Medicaid Other | Admitting: Family Medicine

## 2012-04-25 DIAGNOSIS — I10 Essential (primary) hypertension: Secondary | ICD-10-CM

## 2012-04-25 DIAGNOSIS — R079 Chest pain, unspecified: Secondary | ICD-10-CM | POA: Insufficient documentation

## 2012-04-25 DIAGNOSIS — H524 Presbyopia: Secondary | ICD-10-CM

## 2012-04-25 DIAGNOSIS — K219 Gastro-esophageal reflux disease without esophagitis: Secondary | ICD-10-CM | POA: Insufficient documentation

## 2012-04-25 DIAGNOSIS — R569 Unspecified convulsions: Secondary | ICD-10-CM | POA: Insufficient documentation

## 2012-04-25 DIAGNOSIS — E785 Hyperlipidemia, unspecified: Secondary | ICD-10-CM | POA: Insufficient documentation

## 2012-04-25 DIAGNOSIS — R4182 Altered mental status, unspecified: Secondary | ICD-10-CM | POA: Insufficient documentation

## 2012-04-25 DIAGNOSIS — F259 Schizoaffective disorder, unspecified: Secondary | ICD-10-CM | POA: Insufficient documentation

## 2012-04-25 HISTORY — DX: Anxiety disorder, unspecified: F41.9

## 2012-04-25 LAB — CBC
HCT: 40.5 % (ref 39.0–52.0)
Hemoglobin: 14.6 g/dL (ref 13.0–17.0)
MCH: 33.5 pg (ref 26.0–34.0)
MCHC: 36 g/dL (ref 30.0–36.0)
MCV: 92.9 fL (ref 78.0–100.0)
Platelets: 168 10*3/uL (ref 150–400)
RBC: 4.36 MIL/uL (ref 4.22–5.81)
RDW: 13.7 % (ref 11.5–15.5)
WBC: 7.2 10*3/uL (ref 4.0–10.5)

## 2012-04-25 LAB — COMPREHENSIVE METABOLIC PANEL
ALT: 35 U/L (ref 0–53)
AST: 19 U/L (ref 0–37)
Albumin: 3.9 g/dL (ref 3.5–5.2)
Alkaline Phosphatase: 83 U/L (ref 39–117)
BUN: 16 mg/dL (ref 6–23)
CO2: 26 mEq/L (ref 19–32)
Calcium: 9.1 mg/dL (ref 8.4–10.5)
Chloride: 106 mEq/L (ref 96–112)
Creatinine, Ser: 1.13 mg/dL (ref 0.50–1.35)
GFR calc Af Amer: 82 mL/min — ABNORMAL LOW (ref >90)
GFR calc non Af Amer: 70 mL/min — ABNORMAL LOW (ref >90)
Glucose, Bld: 91 mg/dL (ref 70–99)
Potassium: 4.4 mEq/L (ref 3.5–5.1)
Sodium: 141 mEq/L (ref 135–145)
Total Bilirubin: 0.2 mg/dL — ABNORMAL LOW (ref 0.3–1.2)
Total Protein: 6.6 g/dL (ref 6.0–8.3)

## 2012-04-25 LAB — DIFFERENTIAL
Basophils Absolute: 0 10*3/uL (ref 0.0–0.1)
Basophils Relative: 0 % (ref 0–1)
Eosinophils Absolute: 0.1 10*3/uL (ref 0.0–0.7)
Eosinophils Relative: 1 % (ref 0–5)
Lymphocytes Relative: 22 % (ref 12–46)
Lymphs Abs: 1.6 10*3/uL (ref 0.7–4.0)
Monocytes Absolute: 0.4 10*3/uL (ref 0.1–1.0)
Monocytes Relative: 5 % (ref 3–12)
Neutro Abs: 5.1 10*3/uL (ref 1.7–7.7)
Neutrophils Relative %: 71 % (ref 43–77)

## 2012-04-25 LAB — URINALYSIS, ROUTINE W REFLEX MICROSCOPIC
Bilirubin Urine: NEGATIVE
Glucose, UA: NEGATIVE mg/dL
Hgb urine dipstick: NEGATIVE
Ketones, ur: NEGATIVE mg/dL
Leukocytes, UA: NEGATIVE
Nitrite: NEGATIVE
Protein, ur: NEGATIVE mg/dL
Specific Gravity, Urine: 1.023 (ref 1.005–1.030)
Urobilinogen, UA: 0.2 mg/dL (ref 0.0–1.0)
pH: 6 (ref 5.0–8.0)

## 2012-04-25 LAB — PHENYTOIN LEVEL, TOTAL: Phenytoin Lvl: 17.5 ug/mL (ref 10.0–20.0)

## 2012-04-25 LAB — RAPID URINE DRUG SCREEN, HOSP PERFORMED
Amphetamines: NOT DETECTED
Barbiturates: NOT DETECTED
Benzodiazepines: POSITIVE — AB
Cocaine: NOT DETECTED
Opiates: NOT DETECTED
Tetrahydrocannabinol: NOT DETECTED

## 2012-04-25 LAB — TRICYCLICS SCREEN, URINE: TCA Scrn: POSITIVE — AB

## 2012-04-25 LAB — SALICYLATE LEVEL: Salicylate Lvl: <2 mg/dL — ABNORMAL LOW (ref 2.8–20.0)

## 2012-04-25 LAB — ETHANOL: Alcohol, Ethyl (B): <11 mg/dL (ref 0–11)

## 2012-04-25 LAB — ACETAMINOPHEN LEVEL: Acetaminophen (Tylenol), Serum: <15 ug/mL (ref 10–30)

## 2012-04-25 MED ORDER — SODIUM CHLORIDE 0.9 % IV SOLN
Freq: Once | INTRAVENOUS | Status: AC
  Administered 2012-04-25: 20:00:00 via INTRAVENOUS

## 2012-04-25 MED ORDER — METOPROLOL SUCCINATE ER 200 MG PO TB24: 200.0000 mg | ORAL_TABLET | Freq: Every day | ORAL | Status: DC

## 2012-04-25 MED ORDER — PHENYTOIN 50 MG PO CHEW: 200.0000 mg | CHEWABLE_TABLET | Freq: Every day | ORAL | Status: DC

## 2012-04-25 MED ORDER — SIMVASTATIN 40 MG PO TABS: 40.0000 mg | ORAL_TABLET | Freq: Every day | ORAL | Status: DC

## 2012-04-25 MED ORDER — SODIUM CHLORIDE 0.9 % IV SOLN: INTRAVENOUS | Status: DC

## 2012-04-25 MED ORDER — LISINOPRIL 10 MG PO TABS: 10.0000 mg | ORAL_TABLET | Freq: Every day | ORAL | Status: DC

## 2012-04-25 NOTE — Progress Notes (Signed)
[name] is a 56 y.o. male who presents to [facility] today for   1) chest pain. Patient has had intermittent burning/stabbing central chest pain associated with coughing since February. He has had a significant evaluation for both cardiac and pulmonary with echo, nuclear stress test, and CT angiogram. He was seen while he was in the hospital by Dr. [place] of [facility] cardiology. He denies any exertional pain palpitations or syncope. The pain occurs only with coughing and is intermittent. He rarely coughs. He is taking medications listed below.  2) hypertension: Patient notes that his blood pressure has been elevated recently. His blood pressure at home has been as high as 160/112. It is never less than 135.  3) difficulty with vision: At the end of the visit he notes that he has trouble reading at a distance and up close and especially difficulty driving at night due to decreasing vision. He requests referral to optometrist/ophthalmologist for evaluation of eyeglasses.    PMH: Reviewed significant for schizophrenia and hypertension History  Substance Use Topics  . Smoking status: Never Smoker   . Smokeless tobacco: Never Used  . Alcohol Use: No   ROS as above  Medications reviewed. Current Outpatient Prescriptions  Medication Sig Dispense Refill  . acetaminophen (TYLENOL) 325 MG tablet Take 650 mg by mouth every 6 (six) hours as needed. For pain      . aspirin EC 81 MG tablet Take 81 mg by mouth daily.      . clonazePAM (KLONOPIN) 2 MG tablet Take 2.5 tablets (5 mg total) by mouth 2 (two) times daily.  150 tablet  5  . doxepin (SINEQUAN) 150 MG capsule Take 900 mg by mouth at bedtime. 6 tabs for 900 mg dosage      . ibuprofen (ADVIL,MOTRIN) 200 MG tablet Take 400 mg by mouth every 6 (six) hours as needed. For pain      . meloxicam (MOBIC) 15 MG tablet Take 1 tablet (15 mg total) by mouth daily.  15 tablet  0  . metoprolol (TOPROL-XL) 200 MG 24 hr tablet Take 1 tablet (200 mg total) by mouth  daily.  30 tablet  6  . Multiple Vitamin (MULITIVITAMIN WITH MINERALS) TABS Take 1 tablet by mouth daily.      . naproxen (NAPROSYN) 500 MG tablet Take 1 tablet (500 mg total) by mouth 2 (two) times daily.  30 tablet  0  . omeprazole (PRILOSEC) 20 MG capsule Take 20 mg by mouth daily as needed. Heartburns      . phenytoin (DILANTIN INFATABS) 50 MG tablet Chew 4 tablets (200 mg total) by mouth daily.  120 tablet  6  . polyethylene glycol (MIRALAX / GLYCOLAX) packet Take 17 g by mouth daily as needed. For constipation. Mix into 8 ounces of fluid & drink      . QUEtiapine (SEROQUEL) 300 MG tablet Take 900 mg by mouth at bedtime. Take 3 tabs by mouth each day      . simvastatin (ZOCOR) 40 MG tablet Take 1 tablet (40 mg total) by mouth at bedtime.  30 tablet  6  . testosterone cypionate (DEPOTESTOTERONE CYPIONATE) 100 MG/ML injection Inject 4 mLs (400 mg total) into the muscle every 14 (fourteen) days.  10 mL  3  . lisinopril (PRINIVIL,ZESTRIL) 10 MG tablet Take 1 tablet (10 mg total) by mouth daily.  30 tablet  2  . DISCONTD: DILANTIN INFATABS 50 MG tablet TAKE 4 TABLETS BY MOUTH EVERY DAY  120 tablet  6  . DISCONTD: metoprolol (TOPROL-XL) 200 MG 24 hr tablet Take 200 mg by mouth daily.       Marland Kitchen DISCONTD: metoprolol (TOPROL-XL) 200 MG 24 hr tablet TAKE 1 TABLET BY MOUTH EVERY DAY  30 tablet  6  . DISCONTD: phenytoin (DILANTIN) 50 MG tablet Chew 200 mg by mouth daily.      Marland Kitchen DISCONTD: simvastatin (ZOCOR) 40 MG tablet Take 40 mg by mouth at bedtime.        Exam:  BP 140/98  Pulse 102  Temp(Src) 98.4 F (36.9 C) (Oral)  Ht 5\' 11"  (1.803 m)  Wt 184 lb 12.8 oz (83.825 kg)  BMI 25.77 kg/m2  SpO2 96% Gen: Well NAD HEENT: EOMI,  MMM, normal funduscopic exam normal pupillary light reflex Lungs: CTABL Nl WOB Heart: RRR no MRG Abd: NABS, NT, ND Exts: Non edematous BL  LE, warm and well perfused.   Lab Results  Component Value Date   CKTOTAL 716* 02/21/2012   CKMB 5.2* 02/21/2012   TROPONINI <0.30  02/21/2012    CT angiogram reviewed which did not show pulmonary embolism.  Old EKG is reviewed

## 2012-04-25 NOTE — Patient Instructions (Signed)
Thank you for coming in today. We will refer you to the heart doctor.  Expect a phone call soon.  Please see me in 1 month or sooner if worse.  For blood pressure we are starting a new medicine called lisinopril. Take it daily.   Call or go to the emergency room if you get worse, have trouble breathing, have chest pains, or palpitations.

## 2012-04-25 NOTE — ED Notes (Signed)
No rx prescribed.  Pt voiced understanding to f/u with family medicine.

## 2012-04-25 NOTE — ED Notes (Signed)
MD notified that pt refused CT

## 2012-04-25 NOTE — ED Notes (Signed)
Pt has family at bedside

## 2012-04-25 NOTE — Assessment & Plan Note (Signed)
Patient likely has presbyopia. Plan refer to optometry and followup in one month.

## 2012-04-25 NOTE — ED Notes (Signed)
Pt has pulled out IV, states he is leaving. Dr. Ignacia Palma notified.  Writing paperwork for discharge.

## 2012-04-25 NOTE — ED Notes (Signed)
Patient undressed and in a gown. Cardiac monitor, pulse oximetry, and blood pressure cuff on. 
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
## 2012-04-25 NOTE — ED Notes (Signed)
Pt states he refused CT because he was worried about all of the radiation. Pt states "I've had 4 CTs in the past month. That's going to give me cancer!"

## 2012-04-25 NOTE — Assessment & Plan Note (Signed)
Patient's blood pressure has been increasing recently. Currently higher than I would like. I feel he may benefit from low-dose ACE inhibitor. Plan to start 10 mg ACE inhibitor and followup in one month
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
## 2012-04-25 NOTE — Assessment & Plan Note (Signed)
I am unsure of the cause of [name] chest pain. Appears to be atypical in nature. However he did have elevated troponins while in the hospital with a significant workup.  I feel he may benefit from a repeat evaluation by cardiology.  If cardiology feels that there is nothing else to offer I feel that referral to pulmonology may be warranted.  He did have scarring and atelectasis noted on a CT angiogram of his chest recently. Plan to follow up with me in one month. Red flag reviewed

## 2012-05-19 ENCOUNTER — Ambulatory Visit: Payer: Medicaid Other | Admitting: Cardiology

## 2012-06-10 ENCOUNTER — Ambulatory Visit (INDEPENDENT_AMBULATORY_CARE_PROVIDER_SITE_OTHER): Payer: Medicaid Other | Admitting: Cardiology

## 2012-06-10 ENCOUNTER — Encounter: Payer: Self-pay | Admitting: Cardiology

## 2012-06-10 VITALS — BP 118/76 | HR 71 | Ht 71.0 in | Wt 180.0 lb

## 2012-06-10 DIAGNOSIS — R079 Chest pain, unspecified: Secondary | ICD-10-CM

## 2012-06-10 DIAGNOSIS — I10 Essential (primary) hypertension: Secondary | ICD-10-CM

## 2012-06-10 NOTE — Patient Instructions (Signed)
Continue your current medication  I will send a note to Dr. Denyse Amass. No further cardiac workup needed at this time

## 2012-06-10 NOTE — Progress Notes (Signed)
[name] Date of Birth: 1956-06-06 Medical Record #[id number]  History of Present Illness: [name] is seen for reevaluation of chest pain today. I saw him in consult when he was hospitalized in February of this year. At that time he had number of complaints with mental status changes and possible syncope. He complained of a sharp atypical chest pain. Initial troponin was elevated at 1. All subsequent cardiac enzymes were negative. All CPK MBs were negative. He had an echocardiogram which reported global hypokinesis with ejection fraction 35-40%. He then had a stress Myoview study which showed no perfusion abnormalities and a normal ejection fraction of 58%. On further review of his echocardiogram it was felt that the previous ejection fraction was falsely low due to to poor ultrasound transmission and relatively poor images. After this hospitalization he was apparently readmitted in March. Cardiac enzymes were again cycled. Troponins were all negative. His total CPK was significantly elevated but his relative MB fraction was very low. He was treated for pneumonia. He still has some persistent chest pain. He states that this pain is typically left precordial. It is worse with deep breathing, cough, or sneezing. He previously had significant increase in pain with use of his arms to pull himself up. He states the pain has been steadily diminishing. He can now pull himself up without difficulty. He denies any fever or chills. He has no significant cough.  Current Outpatient Prescriptions on File Prior to Visit  Medication Sig Dispense Refill  . aspirin EC 81 MG tablet Take 81 mg by mouth daily.      . clonazePAM (KLONOPIN) 2 MG tablet Take 5 mg by mouth 2 (two) times daily.      Marland Kitchen doxepin (SINEQUAN) 150 MG capsule Take 900 mg by mouth at bedtime. 6 tabs for 900 mg dosage      . ibuprofen (ADVIL,MOTRIN) 200 MG tablet Take 400 mg by mouth every 6 (six) hours as needed. For pain      . lisinopril  (PRINIVIL,ZESTRIL) 10 MG tablet Take 10 mg by mouth daily.      . metoprolol (TOPROL-XL) 200 MG 24 hr tablet Take 200 mg by mouth daily.      . Multiple Vitamin (MULITIVITAMIN WITH MINERALS) TABS Take 1 tablet by mouth daily.      Marland Kitchen omeprazole (PRILOSEC) 20 MG capsule Take 20 mg by mouth daily as needed. Heartburns      . phenytoin (DILANTIN) 50 MG tablet Chew 200 mg by mouth daily.      . polyethylene glycol (MIRALAX / GLYCOLAX) packet Take 17 g by mouth daily as needed. For constipation. Mix into 8 ounces of fluid & drink      . QUEtiapine (SEROQUEL) 300 MG tablet Take 900 mg by mouth at bedtime.       . simvastatin (ZOCOR) 40 MG tablet Take 40 mg by mouth at bedtime.      Marland Kitchen testosterone cypionate (DEPOTESTOTERONE CYPIONATE) 100 MG/ML injection Inject 400 mg into the muscle every 14 (fourteen) days.      Marland Kitchen DISCONTD: lisinopril (PRINIVIL,ZESTRIL) 10 MG tablet Take 1 tablet (10 mg total) by mouth daily.  30 tablet  2    Allergies  Allergen Reactions  . Sulfa Antibiotics Nausea And Vomiting  . Sulfonamide Derivatives     REACTION: Nausea    Past Medical History  Diagnosis Date  . Seizures   . Hypertension   . GERD (gastroesophageal reflux disease)   . Dyslipidemia   . Schizoaffective  disorder   . Depression   . Anxiety     Past Surgical History  Procedure Date  . Self orchectomy   . Colonoscopy 2010  . Orif shoulder fracture     History  Smoking status  . Never Smoker   Smokeless tobacco  . Never Used    History  Alcohol Use No    Family History  Problem Relation Age of Onset  . Stroke Father     Living at 30  . Stroke Mother     Stroke in late 77's. Died in her 72's    Review of Systems: The review of systems is positive for schizoaffective disorder.  He has been under lot of stress this year with both parents have passed away this year. All other systems were reviewed and are negative.  Physical Exam: BP 118/76  Pulse 71  Ht 5\' 11"  (1.803 m)  Wt 180 lb  (81.647 kg)  BMI 25.10 kg/m2 He is a pale white male in no acute distress. He is balding. He is normocephalic, atraumatic. Pupils are equal round and reactive. Sclera clear. Oropharynx is clear. Neck is supple no JVD, adenopathy, thyromegaly, or bruits. Lungs are clear. Cardiac exam reveals a regular rate and rhythm without gallop, murmur, or click. Abdomen is soft and nontender. He has no hepatosplenomegaly. He has no edema. Pedal pulses are good. Skin is warm and dry. He is alert and oriented x3. His mood is appropriate. He has no focal findings. LABORATORY DATA: ECG today demonstrates normal sinus rhythm with very minor nonspecific ST changes. This is improved compared to May 2013 where the nonspecific changes were more prominent.  Assessment / Plan:

## 2012-06-10 NOTE — Assessment & Plan Note (Signed)
His chest pain symptoms are atypical. They have been present for the past 4 months. They're gradually improving. His symptom complex is more consistent with a pleuritic or musculoskeletal pain. No significant cardiac abnormalities have been found. Based on his evaluation in February it appears his LV function is normal and he had no perfusion abnormalities. There was no evidence of pericarditis. I do not feel that further cardiac evaluation is warranted at this point. I explained all these findings to the patient in detail and he is comfortable with our assessment and reassured. I will see him back on an as-needed basis.

## 2012-07-08 ENCOUNTER — Other Ambulatory Visit: Payer: Self-pay | Admitting: Family Medicine

## 2012-07-08 ENCOUNTER — Ambulatory Visit (INDEPENDENT_AMBULATORY_CARE_PROVIDER_SITE_OTHER): Payer: Medicaid Other | Admitting: Family Medicine

## 2012-07-08 ENCOUNTER — Encounter: Payer: Self-pay | Admitting: Family Medicine

## 2012-07-08 VITALS — BP 111/71 | HR 71 | Ht 71.0 in | Wt 176.0 lb

## 2012-07-08 DIAGNOSIS — I10 Essential (primary) hypertension: Secondary | ICD-10-CM

## 2012-07-08 NOTE — Assessment & Plan Note (Signed)
Blood pressure not elevated at this time.  ACE-I increased in May and looks to be well controlled currently.  Reviewed note from Dr. [place] in cardiology and there was no mention of changing medications.  I discussed with patient that there is no need to adjust his medications at this time.

## 2012-07-08 NOTE — Progress Notes (Signed)
  Subjective:    Patient ID: Lendell Gallick, male    DOB: 1956/10/12, 56 y.o.   MRN: [id number]  HPI 1. HTN:  Patient comes in today because he states that he was told by cardiology to increase his blood pressure medication.  He states that he monitors his blood pressure multiple times per day and his blood pressure is sometimes higher in the afternoons.  He does endorse occasional mild headache when his pressure is elevated.  He denies chest pain, palpitations, shortness of breath, vision changes.   Review of Systems Per HPI    Objective:   Physical Exam  Constitutional: He appears well-nourished. No distress.       Disheveled appearing.    Cardiovascular: Normal rate, regular rhythm and normal heart sounds.   Pulmonary/Chest: Effort normal and breath sounds normal.  Musculoskeletal: He exhibits no edema.          Assessment & Plan:

## 2012-07-08 NOTE — Patient Instructions (Addendum)
Thank you for coming in today, it was nice to meet you Your blood pressure is 111/ 71 today, which is great.  I am not going to change any of your medications at this time. Please follow up with me in 6 months or sooner as needed.

## 2012-07-09 NOTE — Telephone Encounter (Signed)
The Blood Pressure med is Lisinopril.

## 2012-07-09 NOTE — Telephone Encounter (Signed)
Patient is calling with the names of refills that he needs sivastatin, med for High Blood Pressure (not Toprol).

## 2012-07-10 ENCOUNTER — Telehealth: Payer: Self-pay | Admitting: Family Medicine

## 2012-07-10 NOTE — Telephone Encounter (Signed)
Patient saw Dr. Ashley Royalty for first time this week and wants to request PCP change "early."  Requesting to change to male PCP due to his past medical history.  States he feels more comfortable with male provider and they "tend to spend more time and show more interest with their patients."   Informed patient we can change his PCP to male MD.  Will assign patient to Dr. Elwyn Reach.  Gaylene Brooks, RN

## 2012-07-10 NOTE — Telephone Encounter (Signed)
[name] is requesting change of provider to a male one.

## 2012-07-25 ENCOUNTER — Telehealth: Payer: Self-pay | Admitting: Emergency Medicine

## 2012-07-25 NOTE — Telephone Encounter (Signed)
Has a bad cough and doesn't feel good at all - wants to come in to see doctor

## 2012-07-25 NOTE — Telephone Encounter (Signed)
Patient states he  feels weak, has a cough . Two days ago cough was productive with blood tinged sputum.. Doesn't feel that he has fever but not sure. Able to eat and drink OK.  Two days ago did experience some shortness of breath. . He denies any pain , just states he doesn't feel well. Feels weak. Recommended   he go to Urgent Care today to be evaluated.  States he doesn't feel like  going there to sit for two hours. Strongly encouraged him that that would be best to be evaluated. He voices understanding.

## 2012-08-10 ENCOUNTER — Inpatient Hospital Stay (HOSPITAL_COMMUNITY)
Admission: EM | Admit: 2012-08-10 | Discharge: 2012-08-11 | DRG: 312 | Disposition: A | Discharge status: Home / Self Care | Payer: Medicaid Other | Attending: Family Medicine | Admitting: Family Medicine

## 2012-08-10 ENCOUNTER — Encounter (HOSPITAL_COMMUNITY): Payer: Self-pay | Admitting: *Deleted

## 2012-08-10 DIAGNOSIS — Z823 Family history of stroke: Secondary | ICD-10-CM

## 2012-08-10 DIAGNOSIS — E860 Dehydration: Secondary | ICD-10-CM

## 2012-08-10 DIAGNOSIS — N289 Disorder of kidney and ureter, unspecified: Secondary | ICD-10-CM

## 2012-08-10 DIAGNOSIS — N179 Acute kidney failure, unspecified: Secondary | ICD-10-CM | POA: Diagnosis present

## 2012-08-10 DIAGNOSIS — Z882 Allergy status to sulfonamides status: Secondary | ICD-10-CM

## 2012-08-10 DIAGNOSIS — F329 Major depressive disorder, single episode, unspecified: Secondary | ICD-10-CM | POA: Diagnosis present

## 2012-08-10 DIAGNOSIS — I951 Orthostatic hypotension: Principal | ICD-10-CM | POA: Diagnosis present

## 2012-08-10 DIAGNOSIS — K219 Gastro-esophageal reflux disease without esophagitis: Secondary | ICD-10-CM | POA: Diagnosis present

## 2012-08-10 DIAGNOSIS — F411 Generalized anxiety disorder: Secondary | ICD-10-CM | POA: Diagnosis present

## 2012-08-10 DIAGNOSIS — I999 Unspecified disorder of circulatory system: Secondary | ICD-10-CM

## 2012-08-10 DIAGNOSIS — F332 Major depressive disorder, recurrent severe without psychotic features: Secondary | ICD-10-CM | POA: Diagnosis present

## 2012-08-10 DIAGNOSIS — Z7982 Long term (current) use of aspirin: Secondary | ICD-10-CM

## 2012-08-10 DIAGNOSIS — F259 Schizoaffective disorder, unspecified: Secondary | ICD-10-CM | POA: Diagnosis present

## 2012-08-10 DIAGNOSIS — F3289 Other specified depressive episodes: Secondary | ICD-10-CM | POA: Diagnosis present

## 2012-08-10 DIAGNOSIS — I10 Essential (primary) hypertension: Secondary | ICD-10-CM | POA: Diagnosis present

## 2012-08-10 DIAGNOSIS — E785 Hyperlipidemia, unspecified: Secondary | ICD-10-CM | POA: Diagnosis present

## 2012-08-10 DIAGNOSIS — G40909 Epilepsy, unspecified, not intractable, without status epilepticus: Secondary | ICD-10-CM

## 2012-08-10 DIAGNOSIS — I959 Hypotension, unspecified: Secondary | ICD-10-CM

## 2012-08-10 LAB — CBC WITH DIFFERENTIAL/PLATELET
Basophils Absolute: 0 10*3/uL (ref 0.0–0.1)
Basophils Relative: 0 % (ref 0–1)
Eosinophils Absolute: 0.1 10*3/uL (ref 0.0–0.7)
Eosinophils Relative: 1 % (ref 0–5)
HCT: 37.3 % — ABNORMAL LOW (ref 39.0–52.0)
Hemoglobin: 13.5 g/dL (ref 13.0–17.0)
Lymphocytes Relative: 22 % (ref 12–46)
Lymphs Abs: 1.6 10*3/uL (ref 0.7–4.0)
MCH: 33.2 pg (ref 26.0–34.0)
MCHC: 36.2 g/dL — ABNORMAL HIGH (ref 30.0–36.0)
MCV: 91.6 fL (ref 78.0–100.0)
Monocytes Absolute: 0.5 10*3/uL (ref 0.1–1.0)
Monocytes Relative: 7 % (ref 3–12)
Neutro Abs: 5.1 10*3/uL (ref 1.7–7.7)
Neutrophils Relative %: 70 % (ref 43–77)
Platelets: 182 10*3/uL (ref 150–400)
RBC: 4.07 MIL/uL — ABNORMAL LOW (ref 4.22–5.81)
RDW: 13.6 % (ref 11.5–15.5)
WBC: 7.3 10*3/uL (ref 4.0–10.5)

## 2012-08-10 LAB — URINALYSIS, ROUTINE W REFLEX MICROSCOPIC
Bilirubin Urine: NEGATIVE
Glucose, UA: NEGATIVE mg/dL
Hgb urine dipstick: NEGATIVE
Ketones, ur: NEGATIVE mg/dL
Leukocytes, UA: NEGATIVE
Nitrite: NEGATIVE
Protein, ur: NEGATIVE mg/dL
Specific Gravity, Urine: 1.011 (ref 1.005–1.030)
Urobilinogen, UA: 0.2 mg/dL (ref 0.0–1.0)
pH: 6 (ref 5.0–8.0)

## 2012-08-10 LAB — BASIC METABOLIC PANEL
BUN: 26 mg/dL — ABNORMAL HIGH (ref 6–23)
CO2: 19 mEq/L (ref 19–32)
Calcium: 8.6 mg/dL (ref 8.4–10.5)
Chloride: 103 mEq/L (ref 96–112)
Creatinine, Ser: 1.51 mg/dL — ABNORMAL HIGH (ref 0.50–1.35)
GFR calc Af Amer: 57 mL/min — ABNORMAL LOW (ref >90)
GFR calc non Af Amer: 49 mL/min — ABNORMAL LOW (ref >90)
Glucose, Bld: 96 mg/dL (ref 70–99)
Potassium: 3.7 mEq/L (ref 3.5–5.1)
Sodium: 135 mEq/L (ref 135–145)

## 2012-08-10 MED ORDER — TETANUS-DIPHTH-ACELL PERTUSSIS 5-2.5-18.5 LF-MCG/0.5 IM SUSP
0.5000 mL | Freq: Once | INTRAMUSCULAR | Status: AC
  Administered 2012-08-10: 0.5 mL via INTRAMUSCULAR

## 2012-08-10 MED ORDER — SODIUM CHLORIDE 0.9 % IV SOLN
INTRAVENOUS | Status: DC
  Administered 2012-08-10: 19:00:00 via INTRAVENOUS

## 2012-08-10 MED ORDER — TETANUS-DIPHTH-ACELL PERTUSSIS 5-2.5-18.5 LF-MCG/0.5 IM SUSP
INTRAMUSCULAR | Status: AC
  Administered 2012-08-10: 0.5 mL via INTRAMUSCULAR
  Filled 2012-08-10: qty 0.5

## 2012-08-10 MED ORDER — SODIUM CHLORIDE 0.9 % IV SOLN
INTRAVENOUS | Status: DC
  Administered 2012-08-11: 01:00:00 via INTRAVENOUS

## 2012-08-10 MED ORDER — SODIUM CHLORIDE 0.9 % IV BOLUS (SEPSIS)
1000.0000 mL | Freq: Once | INTRAVENOUS | Status: AC
  Administered 2012-08-10: 1000 mL via INTRAVENOUS

## 2012-08-10 NOTE — ED Notes (Signed)
Patient passed out x 2 at [employer] . Bystander called EMS  Patient was hypotensive when EMS arrived BP was 73/42 and patient was cyanotic EMS applied oxygen  at 4 liters and started bolus . EKG was was done by EMS.

## 2012-08-10 NOTE — ED Notes (Signed)
MD at bedside.  EDP Wentz at bs reevaluating this pt.  Pt denies any complaints at present.  Pt aware of his low BP and informed NOT to take BP meds until seen by his doctor this week

## 2012-08-10 NOTE — ED Provider Notes (Signed)
History     CSN: [id number]  Arrival date & time 08/10/12  1820   First MD Initiated Contact with Patient 08/10/12 1854      Chief Complaint  Patient presents with  . Hypotension    patient passed out twice at [name] and [employer] bystander called     (Consider location/radiation/quality/duration/timing/severity/associated sxs/prior treatment) HPI Comments: [name] is a 56 y.o. Male who is here for weakness. He was out shopping today, when he collapsed to the sidewalk injuring his right knee. He tried to get up, but was too dizzy. He denies recent fever, chills, nausea, vomiting, back pain, abdominal pain, or headache. He's been using his usual medication. Standing aggravates the weakness. Rest helps. He has not previously had this.  The history is provided by the patient.    Past Medical History  Diagnosis Date  . Seizures   . Hypertension   . GERD (gastroesophageal reflux disease)   . Dyslipidemia   . Schizoaffective disorder   . Depression   . Anxiety     Past Surgical History  Procedure Date  . Self orchectomy   . Colonoscopy 2010  . Orif shoulder fracture     Family History  Problem Relation Age of Onset  . Stroke Father     Living at 66  . Stroke Mother     Stroke in late 80's. Died in her 48's    History  Substance Use Topics  . Smoking status: Never Smoker   . Smokeless tobacco: Never Used  . Alcohol Use: No      Review of Systems  All other systems reviewed and are negative.    Allergies  Sulfa antibiotics and Sulfonamide derivatives  Home Medications   Current Outpatient Rx  Name Route Sig Dispense Refill  . ALPRAZOLAM 1 MG PO TABS Oral Take 1 mg by mouth.     . ASPIRIN EC 81 MG PO TBEC Oral Take 81 mg by mouth daily.    Marland Kitchen CLONAZEPAM 2 MG PO TABS Oral Take 5 mg by mouth 2 (two) times daily. Pt take 5 mg twice daily. 2.5 tabs    . DOXEPIN HCL 150 MG PO CAPS Oral Take 900 mg by mouth at bedtime. 6 tabs for 900 mg dosage    .  IBUPROFEN 200 MG PO TABS Oral Take 400 mg by mouth every 6 (six) hours as needed. For pain    . LISINOPRIL 10 MG PO TABS Oral Take 10 mg by mouth daily.    Marland Kitchen METOPROLOL SUCCINATE ER 200 MG PO TB24 Oral Take 1 tablet by mouth daily.    . ADULT MULTIVITAMIN W/MINERALS CH Oral Take 1 tablet by mouth daily.    Marland Kitchen OMEPRAZOLE 20 MG PO CPDR Oral Take 20 mg by mouth daily as needed. Heartburns    . PHENYTOIN 50 MG PO CHEW Oral Chew 200 mg by mouth daily. Pt takes 4 tabs of 50 mg for 200 mg dose    . POLYETHYLENE GLYCOL 3350 PO PACK Oral Take 17 g by mouth daily as needed. For constipation. Mix into 8 ounces of fluid & drink    . QUETIAPINE FUMARATE 300 MG PO TABS Oral Take 900 mg by mouth at bedtime.     Marland Kitchen SIMVASTATIN 40 MG PO TABS Oral Take 40 mg by mouth at bedtime.    . TESTOSTERONE CYPIONATE 100 MG/ML IM OIL Intramuscular Inject 400 mg into the muscle every 14 (fourteen) days.      BP 120/71  Pulse 73  Temp 98.5 F (36.9 C) (Oral)  Resp 23  SpO2 98%  Physical Exam  Nursing note and vitals reviewed. Constitutional: He is oriented to person, place, and time. He appears well-developed and well-nourished.  HENT:  Head: Normocephalic and atraumatic.  Right Ear: External ear normal.  Left Ear: External ear normal.  Eyes: Conjunctivae and EOM are normal. Pupils are equal, round, and reactive to light.  Neck: Normal range of motion and phonation normal. Neck supple.  Cardiovascular: Normal rate, regular rhythm, normal heart sounds and intact distal pulses.   Pulmonary/Chest: Effort normal and breath sounds normal. He exhibits no bony tenderness.  Abdominal: Soft. Normal appearance. There is no tenderness.  Musculoskeletal: Normal range of motion.  Neurological: He is alert and oriented to person, place, and time. He has normal strength. No cranial nerve deficit or sensory deficit. He exhibits normal muscle tone. Coordination normal.  Skin: Skin is warm, dry and intact.  Psychiatric: He has a  normal mood and affect. His behavior is normal. Judgment and thought content normal.    ED Course  Procedures (including critical care time)  Emergency department treatment IV fluid bolus, and maintenance. He is able to eat, and drink in emergency department.   Reeval: 22:40- patient ambulated on his own throughout the emergency department without weakness, or dizziness. His knee wound was cleansed. Tetanus booster given. Patient states he also takes metoprolol, it is, not on his medicine list.  Persistent hypotension despite aggressive IV fluid treatment.   CRITICAL CARE Performed by: Mancel Bale L   Total critical care time:40 min.  Critical care time was exclusive of separately billable procedures and treating other patients.  Critical care was necessary to treat or prevent imminent or life-threatening deterioration.  Critical care was time spent personally by me on the following activities: development of treatment plan with patient and/or surrogate as well as nursing, discussions with consultants, evaluation of patient's response to treatment, examination of patient, obtaining history from patient or surrogate, ordering and performing treatments and interventions, ordering and review of laboratory studies, ordering and review of radiographic studies, pulse oximetry and re-evaluation of patient's condition.   Labs Reviewed  CBC WITH DIFFERENTIAL - Abnormal; Notable for the following:    RBC 4.07 (*)     HCT 37.3 (*)     MCHC 36.2 (*)     All other components within normal limits  BASIC METABOLIC PANEL - Abnormal; Notable for the following:    BUN 26 (*)     Creatinine, Ser 1.51 (*)     GFR calc non Af Amer 49 (*)     GFR calc Af Amer 57 (*)     All other components within normal limits  URINALYSIS, ROUTINE W REFLEX MICROSCOPIC  URINE CULTURE   No results found.   1. Hypotension   2. Dehydration       MDM  Persistent hypotension, with dehydration. Patient  typically hypertensive, requiring multiple medications. He'll need to be admitted for stabilization.        Flint Melter, MD 08/11/12 [phone]

## 2012-08-10 NOTE — ED Notes (Signed)
Notified RN, Lassiter of low blood pressure.

## 2012-08-10 NOTE — ED Notes (Signed)
ZOX:[id number]<AV> Expected date:08/10/12<BR> Expected time: 6:11 PM<BR> Means of arrival:Ambulance<BR> Comments:<BR> Orthostatic hypotension

## 2012-08-10 NOTE — ED Notes (Addendum)
Pt. Ambulated down the hall and back to his room without assistance. Pt. Returns back to his room with no complaints.

## 2012-08-10 NOTE — ED Notes (Signed)
Patient is alert and oriented x 3. States he has postural hypotension in the past. BP is 75/46  HR 77 O2 sat 100 % 2 liters

## 2012-08-10 NOTE — ED Notes (Signed)
Abrasion noted to right knee.

## 2012-08-11 ENCOUNTER — Other Ambulatory Visit: Payer: Self-pay

## 2012-08-11 ENCOUNTER — Encounter (HOSPITAL_COMMUNITY): Payer: Self-pay | Admitting: *Deleted

## 2012-08-11 DIAGNOSIS — I959 Hypotension, unspecified: Secondary | ICD-10-CM

## 2012-08-11 DIAGNOSIS — E860 Dehydration: Secondary | ICD-10-CM

## 2012-08-11 LAB — CARDIAC PANEL(CRET KIN+CKTOT+MB+TROPI)
CK, MB: 2.5 ng/mL (ref 0.3–4.0)
CK, MB: 2.5 ng/mL (ref 0.3–4.0)
Relative Index: INVALID (ref 0.0–2.5)
Relative Index: INVALID (ref 0.0–2.5)
Total CK: 98 U/L (ref 7–232)
Total CK: 91 U/L (ref 7–232)
Troponin I: <0.3 ng/mL (ref <0.30)
Troponin I: <0.3 ng/mL (ref <0.30)

## 2012-08-11 LAB — CBC
HCT: 34.1 % — ABNORMAL LOW (ref 39.0–52.0)
Hemoglobin: 12 g/dL — ABNORMAL LOW (ref 13.0–17.0)
MCH: 32.6 pg (ref 26.0–34.0)
MCHC: 35.2 g/dL (ref 30.0–36.0)
MCV: 92.7 fL (ref 78.0–100.0)
Platelets: 162 10*3/uL (ref 150–400)
RBC: 3.68 MIL/uL — ABNORMAL LOW (ref 4.22–5.81)
RDW: 13.8 % (ref 11.5–15.5)
WBC: 5.8 10*3/uL (ref 4.0–10.5)

## 2012-08-11 LAB — BASIC METABOLIC PANEL
BUN: 21 mg/dL (ref 6–23)
CO2: 22 mEq/L (ref 19–32)
Calcium: 8.5 mg/dL (ref 8.4–10.5)
Chloride: 109 mEq/L (ref 96–112)
Creatinine, Ser: 1.11 mg/dL (ref 0.50–1.35)
GFR calc Af Amer: 83 mL/min — ABNORMAL LOW (ref >90)
GFR calc non Af Amer: 71 mL/min — ABNORMAL LOW (ref >90)
Glucose, Bld: 96 mg/dL (ref 70–99)
Potassium: 4 mEq/L (ref 3.5–5.1)
Sodium: 140 mEq/L (ref 135–145)

## 2012-08-11 LAB — MRSA PCR SCREENING: MRSA by PCR: NEGATIVE

## 2012-08-11 LAB — TSH: TSH: 2.512 u[IU]/mL (ref 0.350–4.500)

## 2012-08-11 MED ORDER — DOXEPIN HCL 100 MG PO CAPS
900.0000 mg | ORAL_CAPSULE | Freq: Once | ORAL | Status: DC
  Filled 2012-08-11: qty 9

## 2012-08-11 MED ORDER — ONDANSETRON HCL 4 MG PO TABS: 4.0000 mg | ORAL_TABLET | Freq: Four times a day (QID) | ORAL | Status: DC | PRN

## 2012-08-11 MED ORDER — DOXEPIN HCL 100 MG PO CAPS
900.0000 mg | ORAL_CAPSULE | Freq: Every day | ORAL | Status: DC
  Filled 2012-08-11: qty 9

## 2012-08-11 MED ORDER — SODIUM CHLORIDE 0.9 % IJ SOLN
3.0000 mL | Freq: Two times a day (BID) | INTRAMUSCULAR | Status: DC
  Administered 2012-08-11: 3 mL via INTRAVENOUS

## 2012-08-11 MED ORDER — ASPIRIN EC 81 MG PO TBEC
81.0000 mg | DELAYED_RELEASE_TABLET | Freq: Every day | ORAL | Status: DC
  Administered 2012-08-11: 81 mg via ORAL
  Filled 2012-08-11: qty 1

## 2012-08-11 MED ORDER — PHENYTOIN 50 MG PO CHEW
200.0000 mg | CHEWABLE_TABLET | Freq: Every day | ORAL | Status: DC
  Administered 2012-08-11: 200 mg via ORAL
  Filled 2012-08-11 (×2): qty 4

## 2012-08-11 MED ORDER — HEPARIN SODIUM (PORCINE) 5000 UNIT/ML IJ SOLN
5000.0000 [IU] | Freq: Three times a day (TID) | INTRAMUSCULAR | Status: DC
  Administered 2012-08-11: 5000 [IU] via SUBCUTANEOUS
  Filled 2012-08-11 (×4): qty 1

## 2012-08-11 MED ORDER — CLONAZEPAM 1 MG PO TABS
5.0000 mg | ORAL_TABLET | Freq: Two times a day (BID) | ORAL | Status: DC
  Administered 2012-08-11 (×2): 5 mg via ORAL
  Filled 2012-08-11 (×2): qty 5

## 2012-08-11 MED ORDER — SIMVASTATIN 40 MG PO TABS
40.0000 mg | ORAL_TABLET | Freq: Every day | ORAL | Status: DC
  Filled 2012-08-11: qty 1

## 2012-08-11 MED ORDER — QUETIAPINE FUMARATE 300 MG PO TABS
900.0000 mg | ORAL_TABLET | Freq: Every day | ORAL | Status: DC
  Administered 2012-08-11: 900 mg via ORAL
  Filled 2012-08-11: qty 3

## 2012-08-11 MED ORDER — DOXEPIN HCL 100 MG PO CAPS: 900.0000 mg | ORAL_CAPSULE | Freq: Every day | ORAL | Status: DC

## 2012-08-11 MED ORDER — ACETAMINOPHEN 650 MG RE SUPP: 650.0000 mg | Freq: Four times a day (QID) | RECTAL | Status: DC | PRN

## 2012-08-11 MED ORDER — POLYETHYLENE GLYCOL 3350 17 G PO PACK: 17.0000 g | PACK | Freq: Every day | ORAL | Status: DC | PRN

## 2012-08-11 MED ORDER — ONDANSETRON HCL 4 MG/2ML IJ SOLN: 4.0000 mg | Freq: Four times a day (QID) | INTRAMUSCULAR | Status: DC | PRN

## 2012-08-11 MED ORDER — SODIUM CHLORIDE 0.9 % IV SOLN
INTRAVENOUS | Status: DC
  Administered 2012-08-11: 04:00:00 via INTRAVENOUS

## 2012-08-11 MED ORDER — OXYCODONE HCL 5 MG PO TABS: 5.0000 mg | ORAL_TABLET | ORAL | Status: DC | PRN

## 2012-08-11 MED ORDER — METOPROLOL SUCCINATE ER 50 MG PO TB24: 50.0000 mg | ORAL_TABLET | Freq: Every day | ORAL | Status: DC

## 2012-08-11 MED ORDER — ONDANSETRON HCL 4 MG/2ML IJ SOLN: 4.0000 mg | Freq: Three times a day (TID) | INTRAMUSCULAR | Status: DC | PRN

## 2012-08-11 MED ORDER — ACETAMINOPHEN 325 MG PO TABS: 650.0000 mg | ORAL_TABLET | Freq: Four times a day (QID) | ORAL | Status: DC | PRN

## 2012-08-11 MED ORDER — SODIUM CHLORIDE 0.9 % IV SOLN: INTRAVENOUS | Status: DC

## 2012-08-11 MED ORDER — PANTOPRAZOLE SODIUM 40 MG PO TBEC
40.0000 mg | DELAYED_RELEASE_TABLET | Freq: Every day | ORAL | Status: DC
  Administered 2012-08-11: 40 mg via ORAL

## 2012-08-11 NOTE — H&P (Signed)
Seen and examined.  Chart reviewed.  Discussed with Dr. Fara Boros.  Agree with her management.  Briefly.56 yo male with depression and HBP presents with dehydration and orthostatic hypotension.  He is sleepy this morning but otherwise fine.  BP is returning to normal with hydration and holding meds.  I feel DC today is fine.  I suggest that we do not abruptly stop his beta blocker but DC on a lower dose. (50 mg daily rather than 200 mg)  Encourage hydration.  Follow up in [facility].  While I recognize doxepin can contribute to orthostasis, I would leave him on it due to severe depression and lack of response to other meds.

## 2012-08-11 NOTE — Progress Notes (Signed)
D/c instructions provided with medlist and scripts. Patient transported via wheelchair per unit 2000 staff. Patient transported home via Cab.

## 2012-08-11 NOTE — Progress Notes (Signed)
PCP Note I saw and spoke with the patient regarding this admission.  This is a new patient to me.  I appreciate the excellent care given by the inpatient team.  Discussed with him that is okay for his blood pressure to run a little high for the next 1-2 weeks until his follow up appointment.  If consistently over 150/95, he will call the clinic for advise.  BOOTH, Alezandra Egli 08/11/2012, 12:47 PM

## 2012-08-11 NOTE — Clinical Social Work Psychosocial (Signed)
Clinical Social Work Department BRIEF PSYCHOSOCIAL ASSESSMENT 08/11/2012  Patient:  RITO, LECOMTE     Account Number:  [id number]     Admit date:  08/10/2012  Clinical Social Worker:  Read Drivers  Date/Time:  08/11/2012 12:24 PM  Referred by:  RN  Date Referred:  08/11/2012 Referred for  Other - See comment   Other Referral:   transportation   Interview type:  Patient Other interview type:   none    PSYCHOSOCIAL DATA Living Status:  ALONE Admitted from facility:   Level of care:   Primary support name:  "family" Primary support relationship to patient:   Degree of support available:   good    CURRENT CONCERNS Current Concerns  Other - See comment   Other Concerns:   transportation needed    SOCIAL WORK ASSESSMENT / PLAN CSW assessed pt at bedside.  Pt was alert and oriented.  Pt wanted CSW to help pay for taxi service to get to [facility] where his car is.  CSW informed pt that we would be happy to assist with a bus pass.  Pt declined offer.  Pt stated that he had money to pay for a taxi to get him to his car.  He asked for assistance in obtaining taxi service contact information.   Assessment/plan status:  Other - See comment Other assessment/ plan:   no other need noted   Information/referral to community resources:   [facility]    PATIENT'S/FAMILY'S RESPONSE TO PLAN OF CARE: Pt was appreciative of CSW services        Vickii Penna, [place] [phone]  Clinical Social Work

## 2012-08-11 NOTE — Progress Notes (Addendum)
Blood pressure lying in bed prior to walk 121/ 66 88-18, standing bp prior to walk 119/70 hr 101, pt ambulated at least 400 ft tolerated well, bp rechecked on return to room while standing 116/70 hr 101.[name] A

## 2012-08-11 NOTE — H&P (Signed)
[name] is an 56 y.o. male.    PCP: Dr. Elwyn Reach, [facility] Chief Complaint: Hypotension, dehydration, near-syncope HPI: 56 yo M with PMHx significant for HTN, HLD who presents to the ED after a near-syncopal episode outside of [place] and [place], found to be hypotensive.  BP was only mildly responsive to IVF and so patient was transferred from [facility] ED for admission.  Patient reports he did not eat or drink anything today because he was busy running errands (did not even need to stop to urinate today).  He states he was feeling well, no N/V/D, no CP or SOB, no myalgias, fevers, or chills, just was busy and so he didn't eat and wasn't thirsty so he didn't drink.  When he got to [employer], he jumped out of his car to go inside and within 1-2 minutes of standing, he became very light headed and dizzy.  He states this has happened to him before.  He felt much better after sitting down.  He has remained asymptomatic since.  During the episode, he did not have any CP, SOB, diaphoresis, N/V, numbness/tingling, or weakness.  He states that this sometimes happens and he attributes it to not eating or drinking anything all day.  He states he never felt badly and so he cannot say that he feels any better or different after the IVF.  He has been on his Toprol XL for "a long time" and has been on lisinopril for 2 months.  No recent dose changes.  He does have a known seizure d/o (sounds like tonic-clonic), but he reports that is not what happened today; today, he just got dizzy from standing up too fast.   While in the [facility] ED, pt received 1L NS bolus and IVF @ 250cc/hr.      Past Medical History  Diagnosis Date  . Seizures   . Hypertension   . GERD (gastroesophageal reflux disease)   . Dyslipidemia   . Schizoaffective disorder   . Depression   . Anxiety     Past Surgical History  Procedure Date  . Self orchectomy   . Colonoscopy 2010  . Orif shoulder fracture     Family History  Problem Relation Age of Onset  .  Stroke Father     Living at 3  . Stroke Mother     Stroke in late 3's. Died in her 76's   Social History:  reports that he has never smoked. He has never used smokeless tobacco. He reports that he does not drink alcohol or use illicit drugs.  Allergies:  Allergies  Allergen Reactions  . Sulfa Antibiotics Nausea And Vomiting  . Sulfonamide Derivatives     REACTION: Nausea    Medications Prior to Admission  Medication Sig Dispense Refill  . ALPRAZolam (XANAX) 1 MG tablet Take 1 mg by mouth.       Marland Kitchen aspirin EC 81 MG tablet Take 81 mg by mouth daily.      . clonazePAM (KLONOPIN) 2 MG tablet Take 5 mg by mouth 2 (two) times daily. Pt take 5 mg twice daily. 2.5 tabs      . doxepin (SINEQUAN) 150 MG capsule Take 900 mg by mouth at bedtime. 6 tabs for 900 mg dosage      . ibuprofen (ADVIL,MOTRIN) 200 MG tablet Take 400 mg by mouth every 6 (six) hours as needed. For pain      . lisinopril (PRINIVIL,ZESTRIL) 10 MG tablet Take 10 mg by mouth daily.      Marland Kitchen  metoprolol (TOPROL-XL) 200 MG 24 hr tablet Take 1 tablet by mouth daily.      . Multiple Vitamin (MULITIVITAMIN WITH MINERALS) TABS Take 1 tablet by mouth daily.      Marland Kitchen omeprazole (PRILOSEC) 20 MG capsule Take 20 mg by mouth daily as needed. Heartburns      . phenytoin (DILANTIN) 50 MG tablet Chew 200 mg by mouth daily. Pt takes 4 tabs of 50 mg for 200 mg dose      . polyethylene glycol (MIRALAX / GLYCOLAX) packet Take 17 g by mouth daily as needed. For constipation. Mix into 8 ounces of fluid & drink      . QUEtiapine (SEROQUEL) 300 MG tablet Take 900 mg by mouth at bedtime.       . simvastatin (ZOCOR) 40 MG tablet Take 40 mg by mouth at bedtime.      Marland Kitchen testosterone cypionate (DEPOTESTOTERONE CYPIONATE) 100 MG/ML injection Inject 400 mg into the muscle every 14 (fourteen) days.        Results for orders placed during the hospital encounter of 08/10/12 (from the past 48 hour(s))  CBC WITH DIFFERENTIAL     Status: Abnormal   Collection Time    08/10/12  7:02 PM      Component Value Range Comment   WBC 7.3  4.0 - 10.5 K/uL    RBC 4.07 (*) 4.22 - 5.81 MIL/uL    Hemoglobin 13.5  13.0 - 17.0 g/dL    HCT 16.1 (*) 09.6 - 52.0 %    MCV 91.6  78.0 - 100.0 fL    MCH 33.2  26.0 - 34.0 pg    MCHC 36.2 (*) 30.0 - 36.0 g/dL    RDW 04.5  40.9 - 81.1 %    Platelets 182  150 - 400 K/uL    Neutrophils Relative 70  43 - 77 %    Neutro Abs 5.1  1.7 - 7.7 K/uL    Lymphocytes Relative 22  12 - 46 %    Lymphs Abs 1.6  0.7 - 4.0 K/uL    Monocytes Relative 7  3 - 12 %    Monocytes Absolute 0.5  0.1 - 1.0 K/uL    Eosinophils Relative 1  0 - 5 %    Eosinophils Absolute 0.1  0.0 - 0.7 K/uL    Basophils Relative 0  0 - 1 %    Basophils Absolute 0.0  0.0 - 0.1 K/uL   BASIC METABOLIC PANEL     Status: Abnormal   Collection Time   08/10/12  7:02 PM      Component Value Range Comment   Sodium 135  135 - 145 mEq/L    Potassium 3.7  3.5 - 5.1 mEq/L    Chloride 103  96 - 112 mEq/L    CO2 19  19 - 32 mEq/L    Glucose, Bld 96  70 - 99 mg/dL    BUN 26 (*) 6 - 23 mg/dL    Creatinine, Ser 9.14 (*) 0.50 - 1.35 mg/dL    Calcium 8.6  8.4 - 78.2 mg/dL    GFR calc non Af Amer 49 (*) >90 mL/min    GFR calc Af Amer 57 (*) >90 mL/min   URINALYSIS, ROUTINE W REFLEX MICROSCOPIC     Status: Normal   Collection Time   08/10/12  7:58 PM      Component Value Range Comment   Color, Urine YELLOW  YELLOW    APPearance CLEAR  CLEAR  Specific Gravity, Urine 1.011  1.005 - 1.030    pH 6.0  5.0 - 8.0    Glucose, UA NEGATIVE  NEGATIVE mg/dL    Hgb urine dipstick NEGATIVE  NEGATIVE    Bilirubin Urine NEGATIVE  NEGATIVE    Ketones, ur NEGATIVE  NEGATIVE mg/dL    Protein, ur NEGATIVE  NEGATIVE mg/dL    Urobilinogen, UA 0.2  0.0 - 1.0 mg/dL    Nitrite NEGATIVE  NEGATIVE    Leukocytes, UA NEGATIVE  NEGATIVE MICROSCOPIC NOT DONE ON URINES WITH NEGATIVE PROTEIN, BLOOD, LEUKOCYTES, NITRITE, OR GLUCOSE <1000 mg/dL.   No results found.  Review of Systems    Constitutional: Negative for fever, chills and diaphoresis.  HENT: Negative for sore throat.   Eyes: Positive for blurred vision. Negative for double vision.  Respiratory: Negative for cough, sputum production, shortness of breath and wheezing.   Cardiovascular: Negative for chest pain, palpitations, orthopnea and leg swelling.  Gastrointestinal: Negative for heartburn, nausea, vomiting, abdominal pain, diarrhea and constipation.  Genitourinary: Negative for dysuria, urgency and frequency.  Musculoskeletal: Negative for myalgias and falls.  Skin: Negative for rash.  Neurological: Positive for dizziness. Negative for tingling, focal weakness, seizures, loss of consciousness, weakness and headaches.  Psychiatric/Behavioral: Positive for depression. Negative for memory loss and substance abuse.    Blood pressure 118/67, pulse 75, temperature 98.5 F (36.9 C), temperature source Oral, resp. rate 19, SpO2 98.00%. Physical Exam  Constitutional: He is oriented to person, place, and time. He appears well-developed and well-nourished. No distress.  HENT:  Head: Normocephalic and atraumatic.  Mouth/Throat: Oropharynx is clear and moist. No oropharyngeal exudate.  Eyes: Conjunctivae are normal. Pupils are equal, round, and reactive to light.  Neck: Normal range of motion. Neck supple.  Cardiovascular: Normal rate, regular rhythm, normal heart sounds and intact distal pulses.   No murmur heard. Respiratory: Effort normal and breath sounds normal. No respiratory distress. He has no wheezes. He has no rales.  GI: Soft. Bowel sounds are normal. He exhibits no distension. There is no tenderness.  Musculoskeletal: Normal range of motion. He exhibits no edema.  Lymphadenopathy:    He has no cervical adenopathy.  Neurological: He is alert and oriented to person, place, and time. No cranial nerve deficit.  Skin: Skin is warm and dry. He is not diaphoretic. No erythema.  Psychiatric: He has a normal mood  and affect.     Assessment/Plan 56 yo M w/ PMHx of HTN presents with symptomatic hypotension  1. Hypotension: currently BPs are improved - Admit for monitoring on telemetry  - Hold home BP meds (lisinopril 10, metoprolol XL 200mg  qd) - Monitor BP closely - If we need to re-add medications, would favor short acting BB over long acting  - Will cycle CEs to ensure there isn't a cardiac cause and check EKG x1  - Could also consider head MRI as some neurological insults can present as autonomic instability (pt with both hypotension and bradycardia when you would expect tachycardia if this is purely 2/2 dehydration; also pt also with BB on board and may not be able to compensate with HR increase and no neurological symptoms)  2. Near-syncope: likely related to BP, but pt does have multiple psychotropic medications - Will re-check orthostatic VSs (although may be normal s/p IVF)- were mildly + in the ED (102/59, 78 --> 96/72, 88 --> 89/66, 88) - If symptoms continue, consider further work up in the morning, although I suspect he will be continue to feel  well - Continue seizure and psych meds for now, as pt's story sounds truly orthostatic, but consider holding if dizziness re-occurs and BPs are normal  3. Mild AKI: Cr 1.51, last known was 1.1 - Would fit picture of dehydration - Recheck BMET in AM, continue workup if still rising despite aggressive IV rehydration  4. HLD:  - Continue statin while in house  5. Seizure disorder/Psych history:  - Continue home dilantin for seizures - Continue qhs Seroquel and doxepin as well as Klonopin BID - Patient with significant depression (on disability for this)  FEN/GI: heart healthy diet; NS @ 150cc/hr Ppx: SQ heparin, protonix (pt on home prilosec) Dispo: admit to telemetry, d/c pending improvement of BPs   Erico Stan 08/11/2012, 2:09 AM

## 2012-08-11 NOTE — Care Management Note (Unsigned)
    Page 1 of 1   08/11/2012     11:12:51 AM   CARE MANAGEMENT NOTE 08/11/2012  Patient:  MONG, NEAL   Account Number:  [id number]  Date Initiated:  08/11/2012  Documentation initiated by:  SIMMONS,Amaiah Cristiano  Subjective/Objective Assessment:   ADMITTED WITH SYNCOPE; LIVES AT HOME ALONE; WAS IPTA.     Action/Plan:   DISCHARGE PLANNING DISCUSSED AT BEDSIDE.   Anticipated DC Date:  08/11/2012   Anticipated DC Plan:  HOME/SELF CARE      DC Planning Services  CM consult      Choice offered to / List presented to:             Status of service:  In process, will continue to follow Medicare Important Message given?   (If response is "NO", the following Medicare IM given date fields will be blank) Date Medicare IM given:   Date Additional Medicare IM given:    Discharge Disposition:    Per UR Regulation:  Reviewed for med. necessity/level of care/duration of stay  If discussed at Long Length of Stay Meetings, dates discussed:    Comments:  08/11/12  1112  Dewana Ammirati SIMMONS RN, BSN [phone] NCM WILL FOLLOW.

## 2012-08-11 NOTE — Progress Notes (Signed)
FMTS Clinical Child psychotherapist (CSW) informed by Unit CSW that pt will be getting a cab to his car at the [employer] as pt is not agreeable to a bus pass and does not have any transportation to his car. CSW provided pt with 3 different taxi cab services. CSW also provided pt with several support groups and outpatient counseling facilities as pt reported having a history of depression and recently losing both of his parents. Pt was very receptive and appeared motivated to join a support group within the next few days. Pt stated he has received counseling to manage depression off and on for 30 years however is currently only seeing a psychiatrist for his medications. CSW placed all resources in a packet and confirmed that pt had no additional CSW needs. CSW signing off.  [name], MSW, Theresia Majors [phone]

## 2012-08-12 LAB — URINE CULTURE: Colony Count: 3000

## 2012-08-13 ENCOUNTER — Inpatient Hospital Stay: Payer: Medicaid Other | Admitting: Family Medicine

## 2012-08-13 NOTE — Discharge Summary (Signed)
Family Medicine Teaching [facility] Discharge Summary  Patient name: [name] Medical record number: [id number] Date of birth: 1956/03/23 Age: 56 y.o. Gender: male Date of Admission: 08/10/2012  Date of Discharge: 08/11/2012 Admitting Physician: Sanjuana Letters, MD  Primary Care Provider: BOOTH, Denny Peon, MD  Indication for Hospitalization: hypotension, near syncopal episodes  Discharge Diagnoses:  1. Acute hypotension secondary to dehydration 2. Mild acute kidney injury secondary to dehydration 3. Chronic hypertension 4. Hyperlipidemia 5. Severe depression  Consultations: none  Significant Labs and Imaging:  CBC:    Component Value Date/Time   WBC 5.8 08/11/2012 0311   HGB 12.0* 08/11/2012 0311   HCT 34.1* 08/11/2012 0311   PLT 162 08/11/2012 0311   MCV 92.7 08/11/2012 0311   NEUTROABS 5.1 08/10/2012 1902   LYMPHSABS 1.6 08/10/2012 1902   MONOABS 0.5 08/10/2012 1902   EOSABS 0.1 08/10/2012 1902   BASOSABS 0.0 08/10/2012 1902   Basic Metabolic Panel:    Component Value Date/Time   NA 140 08/11/2012 0311   K 4.0 08/11/2012 0311   CL 109 08/11/2012 0311   CO2 22 08/11/2012 0311   BUN 21 08/11/2012 0311   CREATININE 1.11 08/11/2012 0311   GLUCOSE 96 08/11/2012 0311   CALCIUM 8.5 08/11/2012 0311   Cardiac Panel (last 3 results)  Basename 08/11/12 1108 08/11/12 0311  CKTOTAL 91 98  CKMB 2.5 2.5  TROPONINI <0.30 <0.30  RELINDX RELATIVE INDEX IS INVALID RELATIVE INDEX IS INVALID    Procedures: none  Brief Hospital Course:   56 yo M w/ PMHx of HTN presents with symptomatic hypotension.   1. Hypotension: This was thought to be secondary to dehydration. We held his home BP meds (lisinopril 10, metoprolol XL 200mg  qd) and monitored him on telemetry. Cardiac enzymes were negative x 2. Pt was given IV fluids (NS @ 150cc/hr) and his blood pressures subsequently normalized. He was discharged on a decreased dose of his toprol XL (50mg  qd rather than 200mg  qd) and was asked to  not take his lisinopril until he follows up in clinic. Appt scheduled for Wed 8/28.  2. Near-syncope: likely related to BP, but pt does have multiple psychotropic medications. Pt was orthostatic by vital signs (102/59, 78 --> 96/72, 88 --> 89/66, 88). After hydration with IV fluids, pt tolerated ambulation. Orthostasis likely somewhat secondary to psychotropic medications. Pt instructed to be careful when standing up and given handout on orthostatic hypotension.  3. Mild AKI: Cr 1.51 on admission, last known was 1.1 prior to admission. After IV hydration, creatinine improved to 1.11, so likely due to dehydration.  4. HLD: Continued statin while in house   5. Seizure disorder/Psych history: Patient with significant depression (on disability for this). Continued home dilantin for seizures. Continued qhs Seroquel and doxepin as well as Klonopin BID.   Discharge Medications:  Medication List  As of 08/13/2012 11:25 AM   STOP taking these medications         lisinopril 10 MG tablet         TAKE these medications         ALPRAZolam 1 MG tablet   Commonly known as: XANAX   Take 1 mg by mouth.      aspirin EC 81 MG tablet   Take 81 mg by mouth daily.      clonazePAM 2 MG tablet   Commonly known as: KLONOPIN   Take 5 mg by mouth 2 (two) times daily. Pt take 5 mg twice daily. 2.5 tabs  doxepin 150 MG capsule   Commonly known as: SINEQUAN   Take 900 mg by mouth at bedtime. 6 tabs for 900 mg dosage      ibuprofen 200 MG tablet   Commonly known as: ADVIL,MOTRIN   Take 400 mg by mouth every 6 (six) hours as needed. For pain      metoprolol succinate 50 MG 24 hr tablet   Commonly known as: TOPROL-XL   Take 1 tablet (50 mg total) by mouth daily.      multivitamin with minerals Tabs   Take 1 tablet by mouth daily.      omeprazole 20 MG capsule   Commonly known as: PRILOSEC   Take 20 mg by mouth daily as needed. Heartburns      phenytoin 50 MG tablet   Commonly known as: DILANTIN    Chew 200 mg by mouth daily. Pt takes 4 tabs of 50 mg for 200 mg dose      polyethylene glycol packet   Commonly known as: MIRALAX / GLYCOLAX   Take 17 g by mouth daily as needed. For constipation. Mix into 8 ounces of fluid & drink      QUEtiapine 300 MG tablet   Commonly known as: SEROQUEL   Take 900 mg by mouth at bedtime.      simvastatin 40 MG tablet   Commonly known as: ZOCOR   Take 40 mg by mouth at bedtime.      testosterone cypionate 100 MG/ML injection   Commonly known as: DEPOTESTOTERONE CYPIONATE   Inject 400 mg into the muscle every 14 (fourteen) days.           Issues for Follow Up:  -consider whether to increase metoprolol (was 200mg  daily, decreased to 50mg  on discharge due to hypotension) -needs continued psych followup -recheck orthostatic vitals  Outstanding Results: none  Discharge Instructions: Please refer to Patient Instructions section of EMR for full details.  Patient was counseled important signs and symptoms that should prompt return to medical care, changes in medications, dietary instructions, activity restrictions, and follow up appointments.  Significant instructions noted below:  Follow-up Information    Follow up with MATTHEWS,CODY, DO on 08/13/2012. (at 11:00am)    Contact information:   [address] [place] [address] [phone]       Follow up with Your psychiatrist. (for depression)         Discharge Condition: stable  Levert Feinstein, MD 08/13/2012, 11:25 AM

## 2012-08-13 NOTE — Discharge Summary (Signed)
Seen and examined on the day of discharge.  Agree with management and documentation by Dr. Pollie Meyer.

## 2012-08-20 ENCOUNTER — Telehealth: Payer: Self-pay | Admitting: Emergency Medicine

## 2012-08-20 NOTE — Telephone Encounter (Signed)
Patient recently admitted for hypotension and dehydration.  During his admission his Toprol was decreased from 200 mg daily to 50 mg daily.  He calls today stating that his BP has slowly been increasing since his d/c and today it is 171/101.  His only compliant is a slight HA.  Discussed the situation with Dr. Jennette Kettle and we both agreed that he needs to come in to be seen as opposed to going to [facility].   I offered him an appointment tomorrow but he prefers an afternoon appointment Friday.  His PCP is completely booked that afternoon so I put him with Dr. Earnest Bailey.  Advised him to seek immediate medical attention should his HA worsen or if he developed any S/S of stroke (reviewed these with him).  Will ask red team to monitor Dr. Jonah Blue pm schedule Friday and if she has cancellations or no shows to switch patient from Dr. Earnest Bailey to her.

## 2012-08-20 NOTE — Telephone Encounter (Signed)
BP 171/101 Slight HA

## 2012-08-22 ENCOUNTER — Ambulatory Visit: Payer: Medicaid Other | Admitting: Family Medicine

## 2012-08-27 ENCOUNTER — Encounter: Payer: Self-pay | Admitting: Emergency Medicine

## 2012-08-27 ENCOUNTER — Ambulatory Visit (INDEPENDENT_AMBULATORY_CARE_PROVIDER_SITE_OTHER): Payer: Medicaid Other | Admitting: Emergency Medicine

## 2012-08-27 VITALS — BP 102/71 | HR 118 | Ht 71.0 in | Wt 181.0 lb

## 2012-08-27 DIAGNOSIS — S0510XA Contusion of eyeball and orbital tissues, unspecified eye, initial encounter: Secondary | ICD-10-CM

## 2012-08-27 DIAGNOSIS — I10 Essential (primary) hypertension: Secondary | ICD-10-CM

## 2012-08-27 DIAGNOSIS — R413 Other amnesia: Secondary | ICD-10-CM

## 2012-08-27 DIAGNOSIS — S0012XA Contusion of left eyelid and periocular area, initial encounter: Secondary | ICD-10-CM

## 2012-08-27 NOTE — Assessment & Plan Note (Signed)
Well controlled.  Pulse slightly elevated.  Reluctant to increase metoprolol given borderline low BP today.  Patient to keep log of morning and evening BPs and bring to clinic in 1 month.  May benefit from splitting metoprolol dose to BID, but unclear if he would remember to take both doses.  Follow up in 1 month.

## 2012-08-27 NOTE — Progress Notes (Signed)
  Subjective:    Patient ID: [name], male    DOB: 12-Jan-1956, 56 y.o.   MRN: [id number]  HPI [name] is here for follow up BP.  Hypertension Well controlled: yes Compliant with medication: yes - metoprolol decreased to 50mg  daily after recent hospitalization for orthostatic hypotension and fall Side effects from medication: no Check BP at home: yes  BP ranges: 125/88 in am, 160/101 in pm (takes metoprolol at bedtime)  Chest pain: no Palpitations: no Vision changes: no Leg edema: no Dizziness: no  Black eye Reports falling when he tried to get out of bed too quickly a few days ago.  Denies LOC.  No pain. No change in vision.  Memory loss Reports some concern about his memory as he has trouble remembering his phone number and appointments.  Gradual onset over the last year.  Mom had Alzheimer's in her 90s.  Mom died 9 months ago, dad died 2 months ago; states he okay from a grief perspective.  He did ask if this was onset of senility or maybe related to his depression (see A/P for PHQ-9 score)  I have reviewed and updated the following as appropriate: allergies, current medications and problem list SHx: never smoker  Review of Systems See HPI    Objective:   Physical Exam BP 102/71  Pulse 118  Ht 5\' 11"  (1.803 m)  Wt 181 lb (82.101 kg)  BMI 25.24 kg/m2 Gen: alert, cooperative, NAD HEENT: AT/[place], left black eye, EOMIs, PERRL CV: mild tachycardia in 110s, regular rhythm, no murmurs Pulm: appears to take shallow breaths, CTAB, good air movement Ext: no edema Neuro: alert, oriented x3, MMSE 27/30 (lost 1 point for recall, 2 points for day/date)      Assessment & Plan:  Memory changes: MMSE 27/30 and PHQ-9 18.  Perceived memory issues likely related to depression.  He is on doxepin and seroquel from [name].  He reports a follow up with them in 5 months.  Discussed that he needs to be seen sooner as it appears that his depression is not well  controlled.  Denies SI/HI today.  Black eye, left: secondary to fall, no changes in vision, symptomatic care

## 2012-08-27 NOTE — Patient Instructions (Signed)
It was nice to see you again!  I think your memory problems are coming from your depression.  Please call [name] to schedule an appointment in the next month.  Your blood pressure is good today in the office.  Please take your blood pressure every morning and right before you take your medicines.  Write them all down and call the office [phone]) with the numbers in 1 week. - If your blood pressure is over 180/110, call the office to find out what to do - If your blood pressure is between 160/100 and 180/110, please let me know at our follow up appointment.  I would like to see you back in 1 month for your blood pressure.

## 2012-08-29 ENCOUNTER — Other Ambulatory Visit: Payer: Self-pay | Admitting: Emergency Medicine

## 2012-08-29 MED ORDER — TESTOSTERONE CYPIONATE 100 MG/ML IM SOLN: 400.0000 mg | INTRAMUSCULAR | Status: DC

## 2012-09-17 ENCOUNTER — Other Ambulatory Visit: Payer: Self-pay | Admitting: Family Medicine

## 2012-09-18 NOTE — Telephone Encounter (Signed)
Called patient to pick up Rx and he prefers to have it faxed to [facility] on [address]. Will fax Rx today.Velisa Regnier, [name]

## 2012-09-18 NOTE — Telephone Encounter (Signed)
Prescription for Klonopin at front desk for pick up.

## 2012-09-20 ENCOUNTER — Encounter (HOSPITAL_COMMUNITY): Payer: Self-pay | Admitting: Emergency Medicine

## 2012-09-20 ENCOUNTER — Emergency Department (HOSPITAL_COMMUNITY)
Admission: EM | Admit: 2012-09-20 | Discharge: 2012-09-20 | Disposition: A | Discharge status: Home / Self Care | Payer: Medicaid Other | Attending: Emergency Medicine | Admitting: Emergency Medicine

## 2012-09-20 DIAGNOSIS — Z823 Family history of stroke: Secondary | ICD-10-CM | POA: Insufficient documentation

## 2012-09-20 DIAGNOSIS — H579 Unspecified disorder of eye and adnexa: Secondary | ICD-10-CM | POA: Insufficient documentation

## 2012-09-20 DIAGNOSIS — H5789 Other specified disorders of eye and adnexa: Secondary | ICD-10-CM

## 2012-09-20 DIAGNOSIS — F3289 Other specified depressive episodes: Secondary | ICD-10-CM | POA: Insufficient documentation

## 2012-09-20 DIAGNOSIS — F329 Major depressive disorder, single episode, unspecified: Secondary | ICD-10-CM | POA: Insufficient documentation

## 2012-09-20 DIAGNOSIS — Z882 Allergy status to sulfonamides status: Secondary | ICD-10-CM | POA: Insufficient documentation

## 2012-09-20 DIAGNOSIS — Z7982 Long term (current) use of aspirin: Secondary | ICD-10-CM | POA: Insufficient documentation

## 2012-09-20 DIAGNOSIS — I10 Essential (primary) hypertension: Secondary | ICD-10-CM | POA: Insufficient documentation

## 2012-09-20 DIAGNOSIS — F411 Generalized anxiety disorder: Secondary | ICD-10-CM | POA: Insufficient documentation

## 2012-09-20 DIAGNOSIS — F259 Schizoaffective disorder, unspecified: Secondary | ICD-10-CM | POA: Insufficient documentation

## 2012-09-20 DIAGNOSIS — K219 Gastro-esophageal reflux disease without esophagitis: Secondary | ICD-10-CM | POA: Insufficient documentation

## 2012-09-20 MED ORDER — FLUORESCEIN SODIUM 1 MG OP STRP
1.0000 | ORAL_STRIP | Freq: Once | OPHTHALMIC | Status: DC
  Filled 2012-09-20: qty 1

## 2012-09-20 MED ORDER — TETRACAINE HCL 0.5 % OP SOLN
2.0000 [drp] | Freq: Once | OPHTHALMIC | Status: DC
  Filled 2012-09-20: qty 2

## 2012-09-20 NOTE — ED Notes (Signed)
Woods lamp placed at the bedside.

## 2012-09-20 NOTE — ED Notes (Signed)
Pt reports left his reading glasses around his neck overnight. When pt woke up realized they broke and felt something in his left eye. Pt tried to use q-tip to remove FB without success. Pt reports FB moved up to top of left eye.

## 2012-09-20 NOTE — ED Provider Notes (Signed)
History     CSN: [id number]  Arrival date & time 09/20/12  7829   First MD Initiated Contact with Patient 09/20/12 [phone]      Chief Complaint  Patient presents with  . Foreign Body in Eye    left eye    (Consider location/radiation/quality/duration/timing/severity/associated sxs/prior treatment) HPI Comments: [name] is a 56 y.o. Male who presents with possible foreign body in left eye. States woke up this morning with some irritation in left eye. States felt like something is in it. States he did wake up with his glasses broken, states possibly a screw fell out. States wants to make sure no screw in his eye. States feels like the object moved under his eye lid. Pt states he tried to get it out with a q-tip but could not see anything. Pt denies any visual changes, no sensitivity to light. No pain, states "just feels like something is in it."   The history is provided by the patient.    Past Medical History  Diagnosis Date  . Seizures   . Hypertension   . GERD (gastroesophageal reflux disease)   . Dyslipidemia   . Schizoaffective disorder   . Depression   . Anxiety     Past Surgical History  Procedure Date  . Self orchectomy   . Colonoscopy 2010  . Orif shoulder fracture     Family History  Problem Relation Age of Onset  . Stroke Father     Living at 106  . Stroke Mother     Stroke in late 65's. Died in her 71's    History  Substance Use Topics  . Smoking status: Never Smoker   . Smokeless tobacco: Never Used  . Alcohol Use: No      Review of Systems  Constitutional: Negative for fever and chills.  HENT: Negative for congestion, sore throat, neck pain and neck stiffness.   Eyes: Positive for itching. Negative for photophobia, pain, discharge, redness and visual disturbance.  Respiratory: Negative.   Cardiovascular: Negative.   Musculoskeletal: Negative for myalgias.  Skin: Negative for rash.  Neurological: Negative for dizziness, weakness,  light-headedness and headaches.    Allergies  Sulfa antibiotics  Home Medications   Current Outpatient Rx  Name Route Sig Dispense Refill  . ALPRAZOLAM 1 MG PO TABS Oral Take 1 mg by mouth 3 (three) times daily.     . ASPIRIN EC 81 MG PO TBEC Oral Take 81 mg by mouth at bedtime.     Marland Kitchen CLONAZEPAM 2 MG PO TABS Oral Take 5 mg by mouth 2 (two) times daily.    Marland Kitchen DOXEPIN HCL 150 MG PO CAPS Oral Take 900 mg by mouth at bedtime.    Marland Kitchen METOPROLOL SUCCINATE ER 50 MG PO TB24 Oral Take 50 mg by mouth daily. Take with or immediately following a meal.    . ADULT MULTIVITAMIN W/MINERALS CH Oral Take 1 tablet by mouth daily.    Marland Kitchen OMEPRAZOLE 20 MG PO CPDR Oral Take 20 mg by mouth daily as needed. Heartburns    . PHENYTOIN 50 MG PO CHEW Oral Chew 200 mg by mouth at bedtime.     Marland Kitchen POLYETHYLENE GLYCOL 3350 PO PACK Oral Take 17 g by mouth daily as needed. For constipation. Mix into 8 ounces of fluid & drink    . QUETIAPINE FUMARATE 300 MG PO TABS Oral Take 900 mg by mouth at bedtime.     Marland Kitchen SIMVASTATIN 40 MG PO TABS Oral Take 40 mg  by mouth at bedtime.    . TESTOSTERONE CYPIONATE 100 MG/ML IM OIL Intramuscular Inject 4 mLs (400 mg total) into the muscle every 14 (fourteen) days. 10 mL 5    BP 90/71  Pulse 77  Temp 97.9 F (36.6 C) (Oral)  Resp 16  SpO2 98%  Physical Exam  Nursing note and vitals reviewed. Constitutional: He is oriented to person, place, and time. He appears well-developed and well-nourished. No distress.  HENT:  Head: Normocephalic and atraumatic.  Right Ear: External ear normal.  Left Ear: External ear normal.  Nose: Nose normal.  Mouth/Throat: Oropharynx is clear and moist.  Eyes: Conjunctivae normal, EOM and lids are normal. Pupils are equal, round, and reactive to light. No foreign bodies found. Right eye exhibits no discharge. No foreign body present in the right eye. Left eye exhibits no discharge. No foreign body present in the left eye.  Fundoscopic exam:      The right  eye shows no hemorrhage and no papilledema.       The left eye shows no hemorrhage and no papilledema.  Slit lamp exam:      The left eye shows no corneal abrasion, no corneal ulcer, no foreign body, no hyphema and no hypopyon.  Neck: Neck supple.  Cardiovascular: Normal rate, regular rhythm and normal heart sounds.   Pulmonary/Chest: Effort normal and breath sounds normal. No respiratory distress. He has no wheezes. He has no rales.  Lymphadenopathy:    He has no cervical adenopathy.  Neurological: He is alert and oriented to person, place, and time.  Skin: Skin is warm and dry.    ED Course  Procedures (including critical care time)  Eye normal appearing on exam. No visual changes, no pain, no redness, pupils reactive, no photophobia, doubt glaucoma, doubt retinal detachment. No signs of infection. Fluorescein stain negative. No corneal abrasion or ulceration. Pt does not appear to be in distress. States has to hurry to a meeting. Will d/c home. Given referral to ophthalmology if symptoms not improving.   1. Eye irritation       MDM          Lottie Mussel, PA 09/20/12 1102

## 2012-09-21 NOTE — ED Provider Notes (Signed)
Medical screening examination/treatment/procedure(s) were performed by non-physician practitioner and as supervising physician I was immediately available for consultation/collaboration.   Yomira Flitton, MD 09/21/12 0729 

## 2012-10-18 ENCOUNTER — Other Ambulatory Visit: Payer: Self-pay | Admitting: Family Medicine

## 2012-10-22 ENCOUNTER — Ambulatory Visit (INDEPENDENT_AMBULATORY_CARE_PROVIDER_SITE_OTHER): Payer: Medicaid Other | Admitting: Emergency Medicine

## 2012-10-22 ENCOUNTER — Encounter: Payer: Self-pay | Admitting: Emergency Medicine

## 2012-10-22 VITALS — BP 138/88 | HR 114 | Ht 71.0 in | Wt 182.0 lb

## 2012-10-22 DIAGNOSIS — Z23 Encounter for immunization: Secondary | ICD-10-CM

## 2012-10-22 DIAGNOSIS — I10 Essential (primary) hypertension: Secondary | ICD-10-CM

## 2012-10-22 MED ORDER — METOPROLOL TARTRATE 50 MG PO TABS: 50.0000 mg | ORAL_TABLET | Freq: Two times a day (BID) | ORAL | Status: DC

## 2012-10-22 NOTE — Patient Instructions (Addendum)
It was good to see you! We are going to change your blood pressure medicine to twice a day.  Take metoprolol 50mg  at breakfast and at dinner. Please keep checking your blood pressure at home. - if it is >180/110 and you have a headache or changes in your vision, go to the emergency room - if it is between 160/90 and 180/110, please let me know at our follow up appointment. Follow up in 1 month.

## 2012-10-22 NOTE — Assessment & Plan Note (Signed)
Well controlled in clinic.  Per patient report of BPs at home, the metoprolol seems to wear off in the evenings.  Will change to Lopressor 50mg  BID.  Went over when to go the emergency room for his blood pressure.  Continue checking at home twice a day.  Follow up in 1 month.

## 2012-10-22 NOTE — Progress Notes (Signed)
  Subjective:    Patient ID: Shamar Engelmann, male    DOB: March 17, 1956, 56 y.o.   MRN: [id number]  HPI Mateen Franssen is here for HTN f/u.  Hypertension Well controlled: no - elevated in the evening Compliant with medication: yes Side effects from medication: no Check BP at home: yes  BP ranges: AM - 125/85; PM 160/100  Chest pain: no Palpitations: no Vision changes: no Leg edema: no Dizziness: no  I have reviewed and updated the following as appropriate: allergies and current medications SHx: never smoker Requesting flu shot.  Review of Systems See HPI    Objective:   Physical Exam BP 138/88  Pulse 114  Ht 5\' 11"  (1.803 m)  Wt 182 lb (82.555 kg)  BMI 25.38 kg/m2 Gen: alert, cooperative, NAD HEENT: AT/[place], sclera white, MMM CV: RRR, no murmurs Pulm: CTAB, no wheezes or rales Ext: no edema      Assessment & Plan:  Flu shot today.

## 2012-12-06 ENCOUNTER — Emergency Department (HOSPITAL_COMMUNITY): Payer: Medicaid Other

## 2012-12-06 ENCOUNTER — Inpatient Hospital Stay (HOSPITAL_COMMUNITY)
Admission: EM | Admit: 2012-12-06 | Discharge: 2012-12-09 | DRG: 392 | Disposition: A | Discharge status: Home / Self Care | Payer: Medicaid Other | Attending: Family Medicine | Admitting: Family Medicine

## 2012-12-06 ENCOUNTER — Encounter (HOSPITAL_COMMUNITY): Payer: Self-pay | Admitting: *Deleted

## 2012-12-06 DIAGNOSIS — D72829 Elevated white blood cell count, unspecified: Secondary | ICD-10-CM | POA: Diagnosis present

## 2012-12-06 DIAGNOSIS — F3289 Other specified depressive episodes: Secondary | ICD-10-CM | POA: Diagnosis present

## 2012-12-06 DIAGNOSIS — R197 Diarrhea, unspecified: Secondary | ICD-10-CM

## 2012-12-06 DIAGNOSIS — Z79899 Other long term (current) drug therapy: Secondary | ICD-10-CM

## 2012-12-06 DIAGNOSIS — I10 Essential (primary) hypertension: Secondary | ICD-10-CM | POA: Diagnosis present

## 2012-12-06 DIAGNOSIS — A09 Infectious gastroenteritis and colitis, unspecified: Secondary | ICD-10-CM

## 2012-12-06 DIAGNOSIS — R0682 Tachypnea, not elsewhere classified: Secondary | ICD-10-CM | POA: Diagnosis present

## 2012-12-06 DIAGNOSIS — E872 Acidosis, unspecified: Secondary | ICD-10-CM | POA: Diagnosis present

## 2012-12-06 DIAGNOSIS — E785 Hyperlipidemia, unspecified: Secondary | ICD-10-CM | POA: Diagnosis present

## 2012-12-06 DIAGNOSIS — R000 Tachycardia, unspecified: Secondary | ICD-10-CM | POA: Diagnosis present

## 2012-12-06 DIAGNOSIS — F329 Major depressive disorder, single episode, unspecified: Secondary | ICD-10-CM | POA: Diagnosis present

## 2012-12-06 DIAGNOSIS — Z7982 Long term (current) use of aspirin: Secondary | ICD-10-CM

## 2012-12-06 DIAGNOSIS — G40909 Epilepsy, unspecified, not intractable, without status epilepticus: Secondary | ICD-10-CM | POA: Diagnosis present

## 2012-12-06 DIAGNOSIS — K529 Noninfective gastroenteritis and colitis, unspecified: Secondary | ICD-10-CM

## 2012-12-06 DIAGNOSIS — F411 Generalized anxiety disorder: Secondary | ICD-10-CM | POA: Diagnosis present

## 2012-12-06 DIAGNOSIS — Z8614 Personal history of Methicillin resistant Staphylococcus aureus infection: Secondary | ICD-10-CM

## 2012-12-06 DIAGNOSIS — N19 Unspecified kidney failure: Secondary | ICD-10-CM

## 2012-12-06 DIAGNOSIS — F209 Schizophrenia, unspecified: Secondary | ICD-10-CM | POA: Diagnosis present

## 2012-12-06 DIAGNOSIS — K219 Gastro-esophageal reflux disease without esophagitis: Secondary | ICD-10-CM | POA: Diagnosis present

## 2012-12-06 LAB — BASIC METABOLIC PANEL
BUN: 50 mg/dL — ABNORMAL HIGH (ref 6–23)
BUN: 41 mg/dL — ABNORMAL HIGH (ref 6–23)
BUN: 22 mg/dL (ref 6–23)
CO2: 16 mEq/L — ABNORMAL LOW (ref 19–32)
CO2: 18 mEq/L — ABNORMAL LOW (ref 19–32)
CO2: 20 mEq/L (ref 19–32)
Calcium: 8.7 mg/dL (ref 8.4–10.5)
Calcium: 8.6 mg/dL (ref 8.4–10.5)
Calcium: 8 mg/dL — ABNORMAL LOW (ref 8.4–10.5)
Chloride: 97 mEq/L (ref 96–112)
Chloride: 99 mEq/L (ref 96–112)
Chloride: 103 mEq/L (ref 96–112)
Creatinine, Ser: 1.95 mg/dL — ABNORMAL HIGH (ref 0.50–1.35)
Creatinine, Ser: 1.58 mg/dL — ABNORMAL HIGH (ref 0.50–1.35)
Creatinine, Ser: 1.04 mg/dL (ref 0.50–1.35)
GFR calc Af Amer: 42 mL/min — ABNORMAL LOW (ref >90)
GFR calc Af Amer: 54 mL/min — ABNORMAL LOW (ref >90)
GFR calc Af Amer: 90 mL/min — ABNORMAL LOW (ref >90)
GFR calc non Af Amer: 36 mL/min — ABNORMAL LOW (ref >90)
GFR calc non Af Amer: 47 mL/min — ABNORMAL LOW (ref >90)
GFR calc non Af Amer: 77 mL/min — ABNORMAL LOW (ref >90)
Glucose, Bld: 225 mg/dL — ABNORMAL HIGH (ref 70–99)
Glucose, Bld: 200 mg/dL — ABNORMAL HIGH (ref 70–99)
Glucose, Bld: 163 mg/dL — ABNORMAL HIGH (ref 70–99)
Potassium: 4.8 mEq/L (ref 3.5–5.1)
Potassium: 4.3 mEq/L (ref 3.5–5.1)
Potassium: 3.9 mEq/L (ref 3.5–5.1)
Sodium: 128 mEq/L — ABNORMAL LOW (ref 135–145)
Sodium: 130 mEq/L — ABNORMAL LOW (ref 135–145)
Sodium: 134 mEq/L — ABNORMAL LOW (ref 135–145)

## 2012-12-06 LAB — RAPID URINE DRUG SCREEN, HOSP PERFORMED
Amphetamines: NOT DETECTED
Barbiturates: NOT DETECTED
Benzodiazepines: POSITIVE — AB
Cocaine: NOT DETECTED
Opiates: NOT DETECTED
Tetrahydrocannabinol: NOT DETECTED

## 2012-12-06 LAB — URINALYSIS, ROUTINE W REFLEX MICROSCOPIC
Glucose, UA: NEGATIVE mg/dL
Hgb urine dipstick: NEGATIVE
Ketones, ur: NEGATIVE mg/dL
Leukocytes, UA: NEGATIVE
Nitrite: NEGATIVE
Protein, ur: NEGATIVE mg/dL
Specific Gravity, Urine: 1.023 (ref 1.005–1.030)
Urobilinogen, UA: 0.2 mg/dL (ref 0.0–1.0)
pH: 5 (ref 5.0–8.0)

## 2012-12-06 LAB — CBC WITH DIFFERENTIAL/PLATELET
Basophils Absolute: 0 10*3/uL (ref 0.0–0.1)
Basophils Relative: 0 % (ref 0–1)
Eosinophils Absolute: 0 10*3/uL (ref 0.0–0.7)
Eosinophils Relative: 0 % (ref 0–5)
HCT: 38.2 % — ABNORMAL LOW (ref 39.0–52.0)
Hemoglobin: 13.9 g/dL (ref 13.0–17.0)
Lymphocytes Relative: 3 % — ABNORMAL LOW (ref 12–46)
Lymphs Abs: 0.6 10*3/uL — ABNORMAL LOW (ref 0.7–4.0)
MCH: 32.9 pg (ref 26.0–34.0)
MCHC: 36.4 g/dL — ABNORMAL HIGH (ref 30.0–36.0)
MCV: 90.5 fL (ref 78.0–100.0)
Monocytes Absolute: 1 10*3/uL (ref 0.1–1.0)
Monocytes Relative: 5 % (ref 3–12)
Neutro Abs: 18 10*3/uL — ABNORMAL HIGH (ref 1.7–7.7)
Neutrophils Relative %: 92 % — ABNORMAL HIGH (ref 43–77)
Platelets: 185 10*3/uL (ref 150–400)
RBC: 4.22 MIL/uL (ref 4.22–5.81)
RDW: 13.3 % (ref 11.5–15.5)
WBC Morphology: INCREASED
WBC: 19.6 10*3/uL — ABNORMAL HIGH (ref 4.0–10.5)

## 2012-12-06 LAB — BLOOD GAS, VENOUS
Acid-base deficit: 23 mmol/L — ABNORMAL HIGH (ref 0.0–2.0)
Bicarbonate: 3.9 mEq/L — ABNORMAL LOW (ref 20.0–24.0)
Drawn by: 235321
FIO2: 0.21 %
O2 Saturation: 48.4 %
Patient temperature: 98.6
TCO2: 4 mmol/L (ref 0–100)
pCO2, Ven: 8.2 mmHg — ABNORMAL LOW (ref 45.0–50.0)
pH, Ven: 7.296 (ref 7.250–7.300)
pO2, Ven: 28.1 mmHg — CL (ref 30.0–45.0)

## 2012-12-06 LAB — BLOOD GAS, ARTERIAL
Acid-base deficit: 4.4 mmol/L — ABNORMAL HIGH (ref 0.0–2.0)
Bicarbonate: 18.6 mEq/L — ABNORMAL LOW (ref 20.0–24.0)
FIO2: 0.21 %
O2 Saturation: 92.9 %
Patient temperature: 102.6
TCO2: 19.4 mmol/L (ref 0–100)
pCO2 arterial: 28.8 mmHg — ABNORMAL LOW (ref 35.0–45.0)
pH, Arterial: 7.437 (ref 7.350–7.450)
pO2, Arterial: 75.7 mmHg — ABNORMAL LOW (ref 80.0–100.0)

## 2012-12-06 LAB — GLUCOSE, CAPILLARY: Glucose-Capillary: 159 mg/dL — ABNORMAL HIGH (ref 70–99)

## 2012-12-06 LAB — SALICYLATE LEVEL: Salicylate Lvl: <2 mg/dL — ABNORMAL LOW (ref 2.8–20.0)

## 2012-12-06 LAB — LACTIC ACID, PLASMA
Lactic Acid, Venous: 3.1 mmol/L — ABNORMAL HIGH (ref 0.5–2.2)
Lactic Acid, Venous: 3.1 mmol/L — ABNORMAL HIGH (ref 0.5–2.2)

## 2012-12-06 LAB — ACETAMINOPHEN LEVEL: Acetaminophen (Tylenol), Serum: <15 ug/mL (ref 10–30)

## 2012-12-06 LAB — PHENYTOIN LEVEL, TOTAL: Phenytoin Lvl: 16.3 ug/mL (ref 10.0–20.0)

## 2012-12-06 LAB — MRSA PCR SCREENING: MRSA by PCR: NEGATIVE

## 2012-12-06 LAB — ETHANOL: Alcohol, Ethyl (B): <11 mg/dL (ref 0–11)

## 2012-12-06 MED ORDER — FOLIC ACID 1 MG PO TABS
1.0000 mg | ORAL_TABLET | Freq: Every day | ORAL | Status: DC
  Administered 2012-12-06 – 2012-12-09 (×4): 1 mg via ORAL
  Filled 2012-12-06 (×5): qty 1

## 2012-12-06 MED ORDER — ACETAMINOPHEN 650 MG RE SUPP
RECTAL | Status: AC
  Filled 2012-12-06: qty 1

## 2012-12-06 MED ORDER — ASPIRIN EC 81 MG PO TBEC
81.0000 mg | DELAYED_RELEASE_TABLET | Freq: Every day | ORAL | Status: DC
  Administered 2012-12-06 – 2012-12-08 (×3): 81 mg via ORAL
  Filled 2012-12-06 (×4): qty 1

## 2012-12-06 MED ORDER — VITAMIN B-1 100 MG PO TABS
100.0000 mg | ORAL_TABLET | Freq: Every day | ORAL | Status: DC
  Administered 2012-12-07 – 2012-12-09 (×3): 100 mg via ORAL
  Filled 2012-12-06 (×3): qty 1

## 2012-12-06 MED ORDER — LORAZEPAM 2 MG/ML IJ SOLN
0.0000 mg | Freq: Four times a day (QID) | INTRAMUSCULAR | Status: DC
Start: 2012-12-06 — End: 2012-12-07
  Administered 2012-12-07: 2 mg via INTRAVENOUS
  Administered 2012-12-07: 1 mg via INTRAVENOUS
  Filled 2012-12-06: qty 1

## 2012-12-06 MED ORDER — THIAMINE HCL 100 MG/ML IJ SOLN
100.0000 mg | Freq: Every day | INTRAMUSCULAR | Status: DC
  Filled 2012-12-06 (×3): qty 1

## 2012-12-06 MED ORDER — ACETAMINOPHEN 325 MG PO TABS
650.0000 mg | ORAL_TABLET | Freq: Once | ORAL | Status: AC
  Administered 2012-12-06: 650 mg via ORAL
  Filled 2012-12-06: qty 2

## 2012-12-06 MED ORDER — GI COCKTAIL ~~LOC~~
30.0000 mL | Freq: Once | ORAL | Status: AC
  Administered 2012-12-06: 30 mL via ORAL
  Filled 2012-12-06: qty 30

## 2012-12-06 MED ORDER — LORAZEPAM 2 MG/ML IJ SOLN: 0.5000 mg | Freq: Three times a day (TID) | INTRAMUSCULAR | Status: DC | PRN

## 2012-12-06 MED ORDER — HEPARIN SODIUM (PORCINE) 5000 UNIT/ML IJ SOLN
5000.0000 [IU] | Freq: Three times a day (TID) | INTRAMUSCULAR | Status: DC
  Administered 2012-12-06 – 2012-12-09 (×8): 5000 [IU] via SUBCUTANEOUS
  Filled 2012-12-06 (×11): qty 1

## 2012-12-06 MED ORDER — SODIUM CHLORIDE 0.9 % IV BOLUS (SEPSIS)
1000.0000 mL | Freq: Once | INTRAVENOUS | Status: AC
  Administered 2012-12-06: 1000 mL via INTRAVENOUS

## 2012-12-06 MED ORDER — PIPERACILLIN-TAZOBACTAM 3.375 G IVPB
3.3750 g | Freq: Once | INTRAVENOUS | Status: AC
  Administered 2012-12-06: 3.375 g via INTRAVENOUS
  Filled 2012-12-06: qty 50

## 2012-12-06 MED ORDER — METRONIDAZOLE IN NACL 5-0.79 MG/ML-% IV SOLN
500.0000 mg | Freq: Three times a day (TID) | INTRAVENOUS | Status: DC
  Administered 2012-12-06 – 2012-12-08 (×6): 500 mg via INTRAVENOUS
  Filled 2012-12-06 (×7): qty 100

## 2012-12-06 MED ORDER — ONDANSETRON HCL 4 MG/2ML IJ SOLN: 4.0000 mg | Freq: Three times a day (TID) | INTRAMUSCULAR | Status: AC | PRN

## 2012-12-06 MED ORDER — LORAZEPAM 2 MG/ML IJ SOLN
1.0000 mg | Freq: Three times a day (TID) | INTRAMUSCULAR | Status: DC
  Administered 2012-12-06: 1 mg via INTRAVENOUS
  Filled 2012-12-06: qty 1

## 2012-12-06 MED ORDER — SODIUM CHLORIDE 0.9 % IV SOLN
INTRAVENOUS | Status: AC
  Administered 2012-12-06: 14:00:00 via INTRAVENOUS

## 2012-12-06 MED ORDER — SODIUM CHLORIDE 0.9 % IV BOLUS (SEPSIS)
500.0000 mL | Freq: Once | INTRAVENOUS | Status: AC
  Administered 2012-12-06: 500 mL via INTRAVENOUS

## 2012-12-06 MED ORDER — ACETAMINOPHEN 325 MG PO TABS
650.0000 mg | ORAL_TABLET | Freq: Four times a day (QID) | ORAL | Status: DC | PRN
  Administered 2012-12-06 – 2012-12-08 (×5): 650 mg via ORAL
  Filled 2012-12-06 (×6): qty 2

## 2012-12-06 MED ORDER — CLONAZEPAM 0.5 MG PO TABS
1.0000 mg | ORAL_TABLET | Freq: Two times a day (BID) | ORAL | Status: DC
  Administered 2012-12-06 – 2012-12-07 (×2): 1 mg via ORAL
  Filled 2012-12-06: qty 1
  Filled 2012-12-06: qty 2

## 2012-12-06 MED ORDER — PIPERACILLIN-TAZOBACTAM 3.375 G IVPB
3.3750 g | Freq: Three times a day (TID) | INTRAVENOUS | Status: DC
  Administered 2012-12-06 – 2012-12-07 (×3): 3.375 g via INTRAVENOUS
  Filled 2012-12-06 (×5): qty 50

## 2012-12-06 MED ORDER — PANTOPRAZOLE SODIUM 40 MG PO TBEC
40.0000 mg | DELAYED_RELEASE_TABLET | Freq: Every day | ORAL | Status: DC
  Administered 2012-12-06 – 2012-12-09 (×4): 40 mg via ORAL
  Filled 2012-12-06 (×4): qty 1

## 2012-12-06 MED ORDER — LORAZEPAM 2 MG/ML IJ SOLN
1.0000 mg | Freq: Four times a day (QID) | INTRAMUSCULAR | Status: DC | PRN
  Filled 2012-12-06 (×3): qty 1

## 2012-12-06 MED ORDER — PHENYTOIN 50 MG PO CHEW
200.0000 mg | CHEWABLE_TABLET | Freq: Every day | ORAL | Status: DC
  Administered 2012-12-06 – 2012-12-08 (×3): 200 mg via ORAL
  Filled 2012-12-06 (×4): qty 4

## 2012-12-06 MED ORDER — LORAZEPAM 1 MG PO TABS
1.0000 mg | ORAL_TABLET | Freq: Four times a day (QID) | ORAL | Status: DC | PRN
  Administered 2012-12-08: 1 mg via ORAL
  Filled 2012-12-06: qty 1

## 2012-12-06 MED ORDER — FAMOTIDINE 10 MG PO TABS
10.0000 mg | ORAL_TABLET | Freq: Every day | ORAL | Status: DC
  Administered 2012-12-06 – 2012-12-09 (×4): 10 mg via ORAL
  Filled 2012-12-06 (×4): qty 1

## 2012-12-06 MED ORDER — SIMVASTATIN 40 MG PO TABS
40.0000 mg | ORAL_TABLET | Freq: Every day | ORAL | Status: DC
  Administered 2012-12-06 – 2012-12-07 (×2): 40 mg via ORAL
  Filled 2012-12-06 (×4): qty 1

## 2012-12-06 MED ORDER — QUETIAPINE FUMARATE 300 MG PO TABS
900.0000 mg | ORAL_TABLET | Freq: Every day | ORAL | Status: DC
  Filled 2012-12-06: qty 3

## 2012-12-06 MED ORDER — SODIUM CHLORIDE 0.9 % IV BOLUS (SEPSIS): 1000.0000 mL | Freq: Once | INTRAVENOUS | Status: DC

## 2012-12-06 MED ORDER — VANCOMYCIN HCL IN DEXTROSE 1-5 GM/200ML-% IV SOLN
1000.0000 mg | Freq: Once | INTRAVENOUS | Status: AC
  Administered 2012-12-06: 1000 mg via INTRAVENOUS
  Filled 2012-12-06: qty 200

## 2012-12-06 MED ORDER — VANCOMYCIN HCL 1000 MG IV SOLR
750.0000 mg | Freq: Two times a day (BID) | INTRAVENOUS | Status: DC
  Administered 2012-12-06: 750 mg via INTRAVENOUS
  Filled 2012-12-06 (×3): qty 750

## 2012-12-06 MED ORDER — LORAZEPAM 2 MG/ML IJ SOLN: 0.0000 mg | Freq: Two times a day (BID) | INTRAMUSCULAR | Status: DC

## 2012-12-06 MED ORDER — DOXEPIN HCL 75 MG PO CAPS
150.0000 mg | ORAL_CAPSULE | Freq: Every day | ORAL | Status: DC
  Administered 2012-12-07: 150 mg via ORAL
  Filled 2012-12-06 (×2): qty 2

## 2012-12-06 MED ORDER — ADULT MULTIVITAMIN W/MINERALS CH
1.0000 | ORAL_TABLET | Freq: Every day | ORAL | Status: DC
  Administered 2012-12-06: 1 via ORAL
  Filled 2012-12-06 (×2): qty 1

## 2012-12-06 MED ORDER — ACETAMINOPHEN 650 MG RE SUPP
650.0000 mg | Freq: Once | RECTAL | Status: AC
  Administered 2012-12-06: 650 mg via RECTAL

## 2012-12-06 MED ORDER — ADULT MULTIVITAMIN W/MINERALS CH
1.0000 | ORAL_TABLET | Freq: Every day | ORAL | Status: DC
  Administered 2012-12-06 – 2012-12-09 (×4): 1 via ORAL
  Filled 2012-12-06 (×4): qty 1

## 2012-12-06 NOTE — Progress Notes (Signed)
PGY 1 Update: Pt seen and examined at bedside.  Concerned about not getting his Doxepin and Seroquel.  However, states his abdominal pain is improved.    Exam: Mild TTP LLQ.  No rebound or guarding  A/P: Continue IV ABx and monitor vitals.  Will add low dose Doxepin (150 mg qhs) for now and hold off Seroquel until address tomorrow AM.  Twana First. Paulina Fusi, DO of [facility] 12/06/2012, 10:34 PM

## 2012-12-06 NOTE — Progress Notes (Signed)
ANTIBIOTIC CONSULT NOTE - INITIAL  Pharmacy Consult for Vancomycin and Zosyn Indication: AMS and R/O colitis  Allergies  Allergen Reactions  . Sulfa Antibiotics Nausea And Vomiting    Patient Measurements: Height: 5\' 11"  (180.3 cm) Weight: 185 lb 10 oz (84.2 kg) IBW/kg (Calculated) : 75.3  Adjusted Body Weight:   Vital Signs: Temp: 102.3 F (39.1 C) (12/21 1644) Temp src: Core (Comment) (12/21 1644) BP: 111/68 mmHg (12/21 1644) Pulse Rate: 116  (12/21 1644) Intake/Output from previous day:   Intake/Output from this shift:    Labs:  Basename 12/06/12 0615 12/06/12 0308  WBC -- 19.6*  HGB -- 13.9  PLT -- 185  LABCREA -- --  CREATININE 1.58* 1.95*   Estimated Creatinine Clearance: 54.3 ml/min (by C-G formula based on Cr of 1.58). No results found for this basename: VANCOTROUGH:2,VANCOPEAK:2,VANCORANDOM:2,GENTTROUGH:2,GENTPEAK:2,GENTRANDOM:2,TOBRATROUGH:2,TOBRAPEAK:2,TOBRARND:2,AMIKACINPEAK:2,AMIKACINTROU:2,AMIKACIN:2, in the last 72 hours   Microbiology: Recent Results (from the past 720 hour(s))  MRSA PCR SCREENING     Status: Normal   Collection Time   12/06/12  3:49 PM      Component Value Range Status Comment   MRSA by PCR NEGATIVE  NEGATIVE Final     Medical History: Past Medical History  Diagnosis Date  . Seizures   . Hypertension   . GERD (gastroesophageal reflux disease)   . Dyslipidemia   . Schizoaffective disorder   . Depression   . Anxiety   . MRSA carrier     Medications:  Scheduled:    . sodium chloride   Intravenous STAT  . [COMPLETED] acetaminophen      . [COMPLETED] acetaminophen  650 mg Rectal Once  . [COMPLETED] acetaminophen  650 mg Oral Once  . aspirin EC  81 mg Oral QHS  . clonazePAM  1 mg Oral BID  . famotidine  10 mg Oral Daily  . folic acid  1 mg Oral Daily  . [COMPLETED] gi cocktail  30 mL Oral Once  . [COMPLETED] gi cocktail  30 mL Oral Once  . LORazepam  0-4 mg Intravenous Q6H   Followed by  . LORazepam  0-4 mg  Intravenous Q12H  . metroNIDAZOLE  500 mg Intravenous Q8H  . multivitamin with minerals  1 tablet Oral Daily  . multivitamin with minerals  1 tablet Oral Daily  . pantoprazole  40 mg Oral Daily  . phenytoin  200 mg Oral QHS  . [COMPLETED] piperacillin-tazobactam (ZOSYN)  IV  3.375 g Intravenous Once  . piperacillin-tazobactam (ZOSYN)  IV  3.375 g Intravenous Q8H  . simvastatin  40 mg Oral QHS  . [COMPLETED] sodium chloride  1,000 mL Intravenous Once  . [COMPLETED] sodium chloride  1,000 mL Intravenous Once  . sodium chloride  1,000 mL Intravenous Once  . sodium chloride  1,000 mL Intravenous Once  . [COMPLETED] sodium chloride  500 mL Intravenous Once  . thiamine  100 mg Oral Daily   Or  . thiamine  100 mg Intravenous Daily  . [COMPLETED] vancomycin  1,000 mg Intravenous Once  . [DISCONTINUED] LORazepam  1 mg Intravenous TID  . [DISCONTINUED] QUEtiapine  900 mg Oral QHS   Assessment: 56yo male with AMS and R/O colitis.  He received Vancomycin 1000mg  in ED at [facility] at 0500 today.  He is also on Zosyn 3.375gm IV q6.  Cr has been correcting with fluids and is down to 1.58 (CrCl ~55 ml/min)  Goal of Therapy:  Vancomycin trough level 15-20 mcg/ml  Plan:  1.  Change Zosyn to 3.375gm IV q8,  4 hour infusion 2.  Continue Vancomycin 750mg  IV q12 3.  Steady state Vanc trough when appropriate  Marisue Humble, PharmD Clinical Pharmacist [facility]

## 2012-12-06 NOTE — ED Notes (Signed)
[facility] called and will be en route within the hr to transport pt to [facility]

## 2012-12-06 NOTE — ED Notes (Addendum)
[name] in bed placement does not have approx time for pt bed assignment, stated pt is on list with one person ahead of him for step-down bed.

## 2012-12-06 NOTE — ED Notes (Signed)
[id number]<JY> Expected date:<BR> Expected time:<BR> Means of arrival:<BR> Comments:<BR> Medic 231, 56 M, Diarrhea

## 2012-12-06 NOTE — ED Provider Notes (Signed)
History     CSN: [id number]  Arrival date & time 12/06/12  0226   First MD Initiated Contact with Patient 12/06/12 0242      Chief Complaint  Patient presents with  . Weakness  . Diarrhea    The history is provided by the patient and medical records.   The patient reports abdominal pain and diarrhea over the past 48 hours.  He denies melena and hematochezia.  No chest pain or shortness of breath.  Mild decreased oral intake over the past 24 hours.  He states he took his morning meds this evening instead.  He states he did not take his cervical this time.  The patient denies alcohol use as this was asked specifically because his speech seems somewhat slurred during the examination.  He denies unilateral arm or leg weakness.  No prior history of stroke.  The patient has a psychiatric history including self orchectomy in the past.  He denies chest pain or shortness of breath.  No headache.  No neck stiffness.  No complaints of fevers.  Rectal temperature on arrival to the emergency department was 100.9  Past Medical History  Diagnosis Date  . Seizures   . Hypertension   . GERD (gastroesophageal reflux disease)   . Dyslipidemia   . Schizoaffective disorder   . Depression   . Anxiety   . MRSA carrier     Past Surgical History  Procedure Date  . Self orchectomy   . Colonoscopy 2010  . Orif shoulder fracture     Family History  Problem Relation Age of Onset  . Stroke Father     Living at 79  . Stroke Mother     Stroke in late 55's. Died in her 109's    History  Substance Use Topics  . Smoking status: Never Smoker   . Smokeless tobacco: Never Used  . Alcohol Use: No      Review of Systems  All other systems reviewed and are negative.    Allergies  Sulfa antibiotics  Home Medications   Current Outpatient Rx  Name  Route  Sig  Dispense  Refill  . ALPRAZOLAM 1 MG PO TABS   Oral   Take 1 mg by mouth 3 (three) times daily.          . ASPIRIN EC 81 MG PO  TBEC   Oral   Take 81 mg by mouth at bedtime.          Marland Kitchen CLONAZEPAM 2 MG PO TABS   Oral   Take 5 mg by mouth 2 (two) times daily.         Marland Kitchen DOXEPIN HCL 150 MG PO CAPS   Oral   Take 900 mg by mouth at bedtime.         Marland Kitchen METOPROLOL TARTRATE 50 MG PO TABS   Oral   Take 1 tablet (50 mg total) by mouth 2 (two) times daily.   180 tablet   3   . ADULT MULTIVITAMIN W/MINERALS CH   Oral   Take 1 tablet by mouth daily.         Marland Kitchen OMEPRAZOLE 20 MG PO CPDR      TAKE 1 CAPSULE EVERY DAY   31 capsule   8   . PHENYTOIN 50 MG PO CHEW   Oral   Chew 200 mg by mouth at bedtime.          Marland Kitchen POLYETHYLENE GLYCOL 3350 PO PACK   Oral  Take 17 g by mouth daily as needed. For constipation. Mix into 8 ounces of fluid & drink         . QUETIAPINE FUMARATE 300 MG PO TABS   Oral   Take 900 mg by mouth at bedtime.          Marland Kitchen SIMVASTATIN 40 MG PO TABS   Oral   Take 40 mg by mouth at bedtime.         . TESTOSTERONE CYPIONATE 100 MG/ML IM OIL   Intramuscular   Inject 4 mLs (400 mg total) into the muscle every 14 (fourteen) days.   10 mL   5     BP 105/53  Pulse 97  Temp 99.9 F (37.7 C) (Oral)  Resp 25  SpO2 95%  Physical Exam  Nursing note and vitals reviewed. Constitutional: He appears well-developed and well-nourished.  HENT:  Head: Normocephalic and atraumatic.  Eyes: EOM are normal.  Neck: Normal range of motion.  Cardiovascular: Normal rate, regular rhythm, normal heart sounds and intact distal pulses.   Pulmonary/Chest: Effort normal.       Tachypnea  Abdominal: Soft. He exhibits no distension.       Lower abdominal tenderness without guarding or rebound  Musculoskeletal: Normal range of motion.  Neurological:       Awakens easily to voice.  Follow simple commands.  Speech is somewhat dysarthric.  Skin: Skin is warm and dry.  Psychiatric: He has a normal mood and affect. Judgment normal.    ED Course  Procedures (including critical care time)    Date: 12/06/2012  Rate: 97  Rhythm: normal sinus rhythm  QRS Axis: normal  Intervals: normal  ST/T Wave abnormalities: normal  Conduction Disutrbances: none  Narrative Interpretation:   Old EKG Reviewed: No significant changes noted  CRITICAL CARE Performed by: Lyanne Co Total critical care time: 32 Critical care time was exclusive of separately billable procedures and treating other patients. Critical care was necessary to treat or prevent imminent or life-threatening deterioration. Critical care was time spent personally by me on the following activities: development of treatment plan with patient and/or surrogate as well as nursing, discussions with consultants, evaluation of patient's response to treatment, examination of patient, obtaining history from patient or surrogate, ordering and performing treatments and interventions, ordering and review of laboratory studies, ordering and review of radiographic studies, pulse oximetry and re-evaluation of patient's condition.     Labs Reviewed  CBC WITH DIFFERENTIAL - Abnormal; Notable for the following:    WBC 19.6 (*)     HCT 38.2 (*)     MCHC 36.4 (*)     Neutrophils Relative 92 (*)     Lymphocytes Relative 3 (*)     Neutro Abs 18.0 (*)     Lymphs Abs 0.6 (*)     All other components within normal limits  BASIC METABOLIC PANEL - Abnormal; Notable for the following:    Sodium 128 (*)     CO2 16 (*)     Glucose, Bld 225 (*)     BUN 50 (*)     Creatinine, Ser 1.95 (*)     GFR calc non Af Amer 36 (*)     GFR calc Af Amer 42 (*)     All other components within normal limits  URINALYSIS, ROUTINE W REFLEX MICROSCOPIC - Abnormal; Notable for the following:    Color, Urine AMBER (*)  BIOCHEMICALS MAY BE AFFECTED BY COLOR   APPearance CLOUDY (*)  Bilirubin Urine SMALL (*)     All other components within normal limits  URINE RAPID DRUG SCREEN (HOSP PERFORMED) - Abnormal; Notable for the following:    Benzodiazepines  POSITIVE (*)     All other components within normal limits  LACTIC ACID, PLASMA - Abnormal; Notable for the following:    Lactic Acid, Venous 3.1 (*)     All other components within normal limits  SALICYLATE LEVEL - Abnormal; Notable for the following:    Salicylate Lvl <2.0 (*)     All other components within normal limits  GLUCOSE, CAPILLARY - Abnormal; Notable for the following:    Glucose-Capillary 159 (*)     All other components within normal limits  BLOOD GAS, VENOUS - Abnormal; Notable for the following:    pCO2, Ven 8.2 (*)     pO2, Ven 28.1 (*)     Bicarbonate 3.9 (*)     Acid-base deficit 23.0 (*)     All other components within normal limits  ETHANOL  PHENYTOIN LEVEL, TOTAL  ACETAMINOPHEN LEVEL  BASIC METABOLIC PANEL   Ct Abdomen Pelvis Wo Contrast  12/06/2012  *RADIOLOGY REPORT*  Clinical Data: Abdominal tenderness  CT ABDOMEN AND PELVIS WITHOUT CONTRAST  Technique:  Multidetector CT imaging of the abdomen and pelvis was performed following the standard protocol without intravenous contrast.  Comparison: 12/07/2008  Findings: Degraded by respiratory motion.  Mild right lung base opacity.  Heart size within normal limits.  Coronary artery calcification.  Examination degraded by extremity positioning, causing streak artifact.  Organ abnormality/lesion detection is limited in the absence of intravenous contrast. Within these limitations, unremarkable liver, biliary system, spleen, pancreas, adrenal glands.  Symmetric renal size.  No hydronephrosis or hydroureter.  Colonic wall thickening, most pronounced along the splenic flexure though to a lesser extent may also involve the rectosigmoid colon and descending colon.  No free intraperitoneal air or fluid.  No lymphadenopathy.  No bowel obstruction.  There is scattered atherosclerotic calcification of the aorta and its branches. No aneurysmal dilatation.  Thin-walled bladder.  Small fat containing left inguinal hernia.  No acute  osseous finding.  IMPRESSION: Nonspecific colonic wall thickening, most pronounced within the splenic flexure. Differential includes infectious, inflammatory, and ischemic etiologies, including pseudomembranous colitis. Recommend colonoscopy follow-up to exclude underlying lesion.   Original Report Authenticated By: Jearld Lesch, M.D.    Ct Head Wo Contrast  12/06/2012  *RADIOLOGY REPORT*  Clinical Data: Altered mental status  CT HEAD WITHOUT CONTRAST  Technique:  Contiguous axial images were obtained from the base of the skull through the vertex without contrast.  Comparison: 01/30/2012  Findings: There is no evidence for acute hemorrhage, hydrocephalus, mass lesion, or abnormal extra-axial fluid collection.  No definite CT evidence for acute infarction.  The The visualized paranasal sinuses and mastoid air cells are predominately clear.  IMPRESSION: No acute intracranial abnormality.   Original Report Authenticated By: Jearld Lesch, M.D.    Dg Chest Portable 1 View  12/06/2012  *RADIOLOGY REPORT*  Clinical Data: Weakness, diarrhea.  PORTABLE CHEST - 1 VIEW  Comparison: 04/25/2012  Findings: Mild right lung base opacity and hemidiaphragm elevation. Cardiomediastinal contours within normal range.  No pleural effusion or pneumothorax.  No acute osseous finding.  Multilevel degenerative change.  IMPRESSION: Mild right lung base opacity, favor atelectasis.   Original Report Authenticated By: Jearld Lesch, M.D.      1. Metabolic acidosis   2. Renal failure   3. Diarrhea   4. Colitis  MDM  Patient presenting with altered mental status and diarrhea.  His mental status worsened while he was in the emergency department however he always continued to protect his airway.  He is tenderness lower abdomen on exam initially and thus a CT scan was ordered.  When his mental status worsened head CT was ordered as well.  I question the patient multiple times as to whether or not he taken  intentional drug overdose for which she denies.  He is not on any tricyclic antidepressants.  His QRS is not widened.  Rectal temp 100.9 and a mild elevation in his lactic acid 3.1.  Given the patient's low-grade fever, colitis on CT an initial complaint of abdominal pain and diarrhea this may represent colitis with dehydration.  The patient was hydrated aggressively in the emergency department.  We'll continue to monitor the patient and his mental status.  His mental status were to continue to worsen the patient may require admission to the ICU and intubation for protection.  Currently seems to be protecting his airway now follow this patient closely.  Salicylate and Tylenol levels are normal as well.      5:05 AM Awakens easily to voice.  Denies intentional ingestion.  6:41 AM Patient is much more awake at this time.  The patient be transferred to [facility] where he will be admitted to the step down unit and cared for by the family practice service who is his primary care physician    Lyanne Co, MD 12/06/12 [phone]

## 2012-12-06 NOTE — ED Notes (Signed)
Pt c/o diarrhea with blood noted. With weakness. Pt states started 12/05/12 at 0700. Pt has not eaten in 48 hours but was able to keep oj down. Pt also c/o mid abd pain.

## 2012-12-06 NOTE — H&P (Signed)
Family Practice Teaching Service H&P Pager [phone] PCP: Elwyn Reach Chief Complaint: Weakness, diarrhea  HPI:  [name] is 56 y.o. man with a history of schizophrenia, seizures, anxiety and depression which presented [facility] ED with a 2 day history of abdominal pain and nausea with diarrhea starting this morning. He reports he was on a two day fast from liquid and food for health detoxification benefits. He also states he stopped taking all his meds for past 2-3 days, but changes his story at times. He reports all religions practice this for health reasons and this is why he tries to perform one every 6 months. His stomach started to hurt on the second day  and he  felt like it was "cracking from dryness on the inside" so he drank some orange juice. He began to have diarrhea this morning and complains of pain in his stomach specifically. He endorses muscle aches and pains all over and feels like he has the flu. He is currently having chills,but denies fevers or headache, chest pain, confusion or dizziness.  He initially presented to [facility] ED early this morning, was found to have metabolic acidosis, tachycardia, fever and CT abdomen showing nonspecific colitis on scan. He was given IVF boluses and IV Vanc/zosyn with little change in status except slight improvement in abdominal pain, was transferred to [facility] and arrived about 10-11 hours after his ED evaluation.  Patient states he is emotionally  "numb" all the time, and is unable to feel love. He has battled with depression his entire adult life.   Past Medical History  Diagnosis Date  . Seizures   . Hypertension   . GERD (gastroesophageal reflux disease)   . Dyslipidemia   . Schizoaffective disorder   . Depression   . Anxiety   . MRSA carrier     Past Surgical History  Procedure Date  . Self orchectomy   . Colonoscopy 2010  . Orif shoulder fracture     Family History  Problem Relation Age of Onset  . Stroke Father     Living at 3  .  Stroke Mother     Stroke in late 26's. Died in her 45's   Social History:  reports that he has never smoked. He has never used smokeless tobacco. He reports that he does not drink alcohol or use illicit drugs.  Allergies:  Allergies  Allergen Reactions  . Sulfa Antibiotics Nausea And Vomiting    Medications Prior to Admission  Medication Sig Dispense Refill  . ALPRAZolam (XANAX) 1 MG tablet Take 1 mg by mouth 3 (three) times daily.       Marland Kitchen aspirin EC 81 MG tablet Take 81 mg by mouth at bedtime.       . clonazePAM (KLONOPIN) 2 MG tablet Take 5 mg by mouth 2 (two) times daily.      Marland Kitchen doxepin (SINEQUAN) 150 MG capsule Take 900 mg by mouth at bedtime.      . metoprolol (LOPRESSOR) 50 MG tablet Take 1 tablet (50 mg total) by mouth 2 (two) times daily.  180 tablet  3  . Multiple Vitamin (MULITIVITAMIN WITH MINERALS) TABS Take 1 tablet by mouth daily.      Marland Kitchen omeprazole (PRILOSEC) 20 MG capsule TAKE 1 CAPSULE EVERY DAY  31 capsule  8  . phenytoin (DILANTIN) 50 MG tablet Chew 200 mg by mouth at bedtime.       . polyethylene glycol (MIRALAX / GLYCOLAX) packet Take 17 g by mouth daily as needed. For  constipation. Mix into 8 ounces of fluid & drink      . simvastatin (ZOCOR) 40 MG tablet Take 40 mg by mouth at bedtime.      Marland Kitchen QUEtiapine (SEROQUEL) 300 MG tablet Take 900 mg by mouth at bedtime.       Marland Kitchen testosterone cypionate (DEPOTESTOTERONE CYPIONATE) 100 MG/ML injection Inject 4 mLs (400 mg total) into the muscle every 14 (fourteen) days.  10 mL  5    Results for orders placed during the hospital encounter of 12/06/12 (from the past 48 hour(s))  URINALYSIS, ROUTINE W REFLEX MICROSCOPIC     Status: Abnormal   Collection Time   12/06/12  3:06 AM      Component Value Range Comment   Color, Urine AMBER (*) YELLOW BIOCHEMICALS MAY BE AFFECTED BY COLOR   APPearance CLOUDY (*) CLEAR    Specific Gravity, Urine 1.023  1.005 - 1.030    pH 5.0  5.0 - 8.0    Glucose, UA NEGATIVE  NEGATIVE mg/dL    Hgb  urine dipstick NEGATIVE  NEGATIVE    Bilirubin Urine SMALL (*) NEGATIVE    Ketones, ur NEGATIVE  NEGATIVE mg/dL    Protein, ur NEGATIVE  NEGATIVE mg/dL    Urobilinogen, UA 0.2  0.0 - 1.0 mg/dL    Nitrite NEGATIVE  NEGATIVE    Leukocytes, UA NEGATIVE  NEGATIVE MICROSCOPIC NOT DONE ON URINES WITH NEGATIVE PROTEIN, BLOOD, LEUKOCYTES, NITRITE, OR GLUCOSE <1000 mg/dL.  CBC WITH DIFFERENTIAL     Status: Abnormal   Collection Time   12/06/12  3:08 AM      Component Value Range Comment   WBC 19.6 (*) 4.0 - 10.5 K/uL    RBC 4.22  4.22 - 5.81 MIL/uL    Hemoglobin 13.9  13.0 - 17.0 g/dL    HCT 16.1 (*) 09.6 - 52.0 %    MCV 90.5  78.0 - 100.0 fL    MCH 32.9  26.0 - 34.0 pg    MCHC 36.4 (*) 30.0 - 36.0 g/dL    RDW 04.5  40.9 - 81.1 %    Platelets 185  150 - 400 K/uL    Neutrophils Relative 92 (*) 43 - 77 %    Lymphocytes Relative 3 (*) 12 - 46 %    Monocytes Relative 5  3 - 12 %    Eosinophils Relative 0  0 - 5 %    Basophils Relative 0  0 - 1 %    Neutro Abs 18.0 (*) 1.7 - 7.7 K/uL    Lymphs Abs 0.6 (*) 0.7 - 4.0 K/uL    Monocytes Absolute 1.0  0.1 - 1.0 K/uL    Eosinophils Absolute 0.0  0.0 - 0.7 K/uL    Basophils Absolute 0.0  0.0 - 0.1 K/uL    WBC Morphology INCREASED BANDS (>20% BANDS)     BASIC METABOLIC PANEL     Status: Abnormal   Collection Time   12/06/12  3:08 AM      Component Value Range Comment   Sodium 128 (*) 135 - 145 mEq/L    Potassium 4.8  3.5 - 5.1 mEq/L    Chloride 97  96 - 112 mEq/L    CO2 16 (*) 19 - 32 mEq/L    Glucose, Bld 225 (*) 70 - 99 mg/dL    BUN 50 (*) 6 - 23 mg/dL    Creatinine, Ser 9.14 (*) 0.50 - 1.35 mg/dL    Calcium 8.7  8.4 - 78.2  mg/dL    GFR calc non Af Amer 36 (*) >90 mL/min    GFR calc Af Amer 42 (*) >90 mL/min   ETHANOL     Status: Normal   Collection Time   12/06/12  3:08 AM      Component Value Range Comment   Alcohol, Ethyl (B) <11  0 - 11 mg/dL   ACETAMINOPHEN LEVEL     Status: Normal   Collection Time   12/06/12  3:08 AM       Component Value Range Comment   Acetaminophen (Tylenol), Serum <15.0  10 - 30 ug/mL   SALICYLATE LEVEL     Status: Abnormal   Collection Time   12/06/12  3:08 AM      Component Value Range Comment   Salicylate Lvl <2.0 (*) 2.8 - 20.0 mg/dL   PHENYTOIN LEVEL, TOTAL     Status: Normal   Collection Time   12/06/12  3:16 AM      Component Value Range Comment   Phenytoin Lvl 16.3  10.0 - 20.0 ug/mL   LACTIC ACID, PLASMA     Status: Abnormal   Collection Time   12/06/12  3:55 AM      Component Value Range Comment   Lactic Acid, Venous 3.1 (*) 0.5 - 2.2 mmol/L   GLUCOSE, CAPILLARY     Status: Abnormal   Collection Time   12/06/12  3:58 AM      Component Value Range Comment   Glucose-Capillary 159 (*) 70 - 99 mg/dL   URINE RAPID DRUG SCREEN (HOSP PERFORMED)     Status: Abnormal   Collection Time   12/06/12  4:06 AM      Component Value Range Comment   Opiates NONE DETECTED  NONE DETECTED    Cocaine NONE DETECTED  NONE DETECTED    Benzodiazepines POSITIVE (*) NONE DETECTED    Amphetamines NONE DETECTED  NONE DETECTED    Tetrahydrocannabinol NONE DETECTED  NONE DETECTED    Barbiturates NONE DETECTED  NONE DETECTED   BLOOD GAS, VENOUS     Status: Abnormal   Collection Time   12/06/12  4:20 AM      Component Value Range Comment   FIO2 0.21      pH, Ven 7.296  7.250 - 7.300    pCO2, Ven 8.2 (*) 45.0 - 50.0 mmHg    pO2, Ven 28.1 (*) 30.0 - 45.0 mmHg    Bicarbonate 3.9 (*) 20.0 - 24.0 mEq/L    TCO2 4.0  0 - 100 mmol/L    Acid-base deficit 23.0 (*) 0.0 - 2.0 mmol/L    O2 Saturation 48.4      Patient temperature 98.6      Collection site VEIN      Drawn by [id number]      Sample type VEIN     BASIC METABOLIC PANEL     Status: Abnormal   Collection Time   12/06/12  6:15 AM      Component Value Range Comment   Sodium 130 (*) 135 - 145 mEq/L    Potassium 4.3  3.5 - 5.1 mEq/L    Chloride 99  96 - 112 mEq/L    CO2 18 (*) 19 - 32 mEq/L    Glucose, Bld 200 (*) 70 - 99 mg/dL    BUN 41 (*)  6 - 23 mg/dL    Creatinine, Ser 6.57 (*) 0.50 - 1.35 mg/dL    Calcium 8.6  8.4 - 84.6 mg/dL  GFR calc non Af Amer 47 (*) >90 mL/min    GFR calc Af Amer 54 (*) >90 mL/min    Ct Abdomen Pelvis Wo Contrast 12/06/2012   IMPRESSION: Nonspecific colonic wall thickening, most pronounced within the splenic flexure. Differential includes infectious, inflammatory, and ischemic etiologies, including pseudomembranous colitis. Recommend colonoscopy follow-up to exclude underlying lesion.   Original Report Authenticated By: Jearld Lesch, M.D.    Ct Head Wo Contrast 12/21/2013IMPRESSION: No acute intracranial abnormality.   Original Report Authenticated By: Jearld Lesch, M.D.    Dg Chest Portable 1 View 12/06/2012  IMPRESSION: Mild right lung base opacity, favor atelectasis.   Original Report Authenticated By: Jearld Lesch, M.D.    Review of Systems  All other systems reviewed and are negative.   Blood pressure 111/68, pulse 116, temperature 102.3 F (39.1 C), temperature source Core (Comment), resp. rate 94, SpO2 94.00%. Physical Exam  Nursing note and vitals reviewed. Constitutional: He is oriented to person, place, and time. He appears well-developed and well-nourished.       Appears cold, shivering. Fetal position in bed.   HENT:  Head: Normocephalic and atraumatic.  Mouth/Throat: No oropharyngeal exudate.  Eyes: Conjunctivae normal and EOM are normal. Pupils are equal, round, and reactive to light. Right eye exhibits no discharge. Left eye exhibits no discharge.  Neck: Normal range of motion. Neck supple.  Cardiovascular: Regular rhythm, normal heart sounds and intact distal pulses.   No murmur heard.      Tachycardic   Respiratory: Effort normal and breath sounds normal. No respiratory distress. He has no wheezes. He has no rales.  GI: Soft. Bowel sounds are normal. He exhibits no distension. There is tenderness. There is guarding. There is no rebound.       TTP epigastric  region.  Musculoskeletal: He exhibits no edema and no tenderness.  Neurological: He is alert and oriented to person, place, and time. No cranial nerve deficit.  Skin: Skin is warm and dry. No rash noted. No erythema.  Psychiatric: His mood appears anxious. His speech is tangential.     Assessment/Plan;  Damion Kant is 56 y.o. man with a history of schizophrenia, seizures, anxiety and depression which presented [facility] ED with a 2 day history of abdominal pain, nausea with diarrhea starting today. Found to have metabolic acidosis, tachycardia and fever.   Stomach pain and diarrhea: Infectious etiology seems most likely with his febrile state today with leukocytosis and left shift, and CT of the abdomen shows nonspecific colonic wall thickening-colitis. Possible differentials include infectious or ischemic colitis, influenza or rotavirus with dehydration,  Drug overdose of doxepin or withdrawal from benzodiapepine if he actually stopped all meds 2 days ago. -empiric vanc/zosyn and will add flagyl for anaerobic coverage in setting of SIRS criteria.  -repeat lactic acid and BMET to evaluate for possible ischemia. - GI cocktail x2 at [facility], may have again if he desires - Zofran for nausea - Tylenol for pain and fever - Rotavirus and Influenza PCR pending. - Clear liquids -Will need outpatient colonoscopy to evaluate source.  Psych/neuro/AMS: clear mentation on arrival. Given his psychiatric history and elevated doses of Seroquel 900 mg, doxepin 900 mg, Xanax and klonopin, this event could very well be an accidental overdose or withdrawl. CT of the head was normal. - Anxiety: Hold Xanax and substituted with ativan 0.5 mg TID PRN -restart klonopin 1mg  BID (home dose 5mg  BID) hold parameter for BP <100/50 - Depression: hold Doxepin 900 mg  - Schizophrenia:  hold Seroquel 900 mg - Seizures: phenytoin 200 mg -level therapeutic will continue. - CIWA protocol in case of overdose/withdraw  Fever with  leukocytosis: SIRS criteria.  - continue empiric abx vanc/zosyn, add flagyl -repeat fluid bolus-BP now lower after ativan given.  -no blood ctx obtained, will check now. - Monitor on telemetry - repeat CBC in the morning - Blood Cx: Pending  Metabolic acidosis with a Anion gap of 13. - check ABG - Repeat BMP  HTN: -Held metoprolol d/t lower BP  FENGI: - 125cc/hr NS - Bolus 1L now. May repeat as needed. - Clear liquid diet   Proph: SQ heparin Code: full DISPO: Pending stabilization of labs and clinical symptoms. In SDU pending improvement.  I have seen and examined patient with Dr. Claiborne Billings. Agree with above and edits made as necessary.  Lloyd Huger, MD [facility] Resident - PGY-3 12/06/2012 7:05 PM    Felix Pacini 12/06/2012, 5:14 PM

## 2012-12-06 NOTE — ED Notes (Signed)
Pt resting quietly at this time. Resp even and unlabored with bilateral equal chest rise. Will monitor

## 2012-12-07 LAB — INFLUENZA PANEL BY PCR (TYPE A & B)
H1N1 flu by pcr: NOT DETECTED
Influenza A By PCR: NEGATIVE
Influenza B By PCR: NEGATIVE

## 2012-12-07 LAB — CBC
HCT: 29 % — ABNORMAL LOW (ref 39.0–52.0)
Hemoglobin: 10.5 g/dL — ABNORMAL LOW (ref 13.0–17.0)
MCH: 32.7 pg (ref 26.0–34.0)
MCHC: 36.2 g/dL — ABNORMAL HIGH (ref 30.0–36.0)
MCV: 90.3 fL (ref 78.0–100.0)
Platelets: 133 10*3/uL — ABNORMAL LOW (ref 150–400)
RBC: 3.21 MIL/uL — ABNORMAL LOW (ref 4.22–5.81)
RDW: 13.6 % (ref 11.5–15.5)
WBC: 9.8 10*3/uL (ref 4.0–10.5)

## 2012-12-07 LAB — BASIC METABOLIC PANEL
BUN: 14 mg/dL (ref 6–23)
CO2: 19 mEq/L (ref 19–32)
Calcium: 7.7 mg/dL — ABNORMAL LOW (ref 8.4–10.5)
Chloride: 109 mEq/L (ref 96–112)
Creatinine, Ser: 0.9 mg/dL (ref 0.50–1.35)
GFR calc Af Amer: >90 mL/min (ref >90)
GFR calc non Af Amer: >90 mL/min (ref >90)
Glucose, Bld: 142 mg/dL — ABNORMAL HIGH (ref 70–99)
Potassium: 3.8 mEq/L (ref 3.5–5.1)
Sodium: 137 mEq/L (ref 135–145)

## 2012-12-07 LAB — ROTAVIRUS ANTIGEN, STOOL: Rotavirus: NEGATIVE

## 2012-12-07 LAB — LACTIC ACID, PLASMA: Lactic Acid, Venous: 1.1 mmol/L (ref 0.5–2.2)

## 2012-12-07 MED ORDER — METOPROLOL TARTRATE 25 MG PO TABS
25.0000 mg | ORAL_TABLET | Freq: Two times a day (BID) | ORAL | Status: DC
  Administered 2012-12-07 (×2): 25 mg via ORAL
  Filled 2012-12-07 (×4): qty 1

## 2012-12-07 MED ORDER — CLONAZEPAM 1 MG PO TABS
2.0000 mg | ORAL_TABLET | Freq: Two times a day (BID) | ORAL | Status: DC
  Administered 2012-12-07 – 2012-12-08 (×3): 2 mg via ORAL
  Filled 2012-12-07 (×2): qty 4
  Filled 2012-12-07: qty 2

## 2012-12-07 MED ORDER — SODIUM CHLORIDE 0.9 % IV BOLUS (SEPSIS)
1000.0000 mL | Freq: Once | INTRAVENOUS | Status: AC
  Administered 2012-12-07: 1000 mL via INTRAVENOUS

## 2012-12-07 MED ORDER — LORAZEPAM 2 MG/ML IJ SOLN
0.0000 mg | Freq: Two times a day (BID) | INTRAMUSCULAR | Status: DC
  Administered 2012-12-07: 1 mg via INTRAVENOUS

## 2012-12-07 MED ORDER — SODIUM CHLORIDE 0.9 % IV SOLN
INTRAVENOUS | Status: DC
  Administered 2012-12-07 – 2012-12-08 (×3): via INTRAVENOUS

## 2012-12-07 MED ORDER — PIPERACILLIN-TAZOBACTAM 3.375 G IVPB
3.3750 g | Freq: Three times a day (TID) | INTRAVENOUS | Status: DC
  Administered 2012-12-07 – 2012-12-08 (×2): 3.375 g via INTRAVENOUS
  Filled 2012-12-07 (×4): qty 50

## 2012-12-07 MED ORDER — LORAZEPAM 2 MG/ML IJ SOLN: 0.0000 mg | Freq: Four times a day (QID) | INTRAMUSCULAR | Status: AC

## 2012-12-07 MED ORDER — DOXEPIN HCL 100 MG PO CAPS
900.0000 mg | ORAL_CAPSULE | Freq: Every day | ORAL | Status: DC
  Administered 2012-12-07 – 2012-12-08 (×2): 900 mg via ORAL
  Filled 2012-12-07 (×3): qty 9

## 2012-12-07 MED ORDER — VANCOMYCIN HCL 10 G IV SOLR
1250.0000 mg | Freq: Two times a day (BID) | INTRAVENOUS | Status: DC
  Administered 2012-12-07 – 2012-12-08 (×3): 1250 mg via INTRAVENOUS
  Filled 2012-12-07 (×4): qty 1250

## 2012-12-07 NOTE — Progress Notes (Signed)
Family Practice Teaching Service  Daily Progress Note Pager [phone]  Subjective: Abdominal pain and diarrhea are better.  Still feels weak all over.  Initially asks if he can go home, then states he is probably too weak to go home.  Is very concerned about getting his psych meds because he feels like he needs them right now, but cannot elaborate on what that means.   Objective: Vital signs in last 24 hours: Temp:  [99.5 F (37.5 C)-102.8 F (39.3 C)] 100.8 F (38.2 C) (12/22 0800) Pulse Rate:  [105-122] 121  (12/22 0800) Resp:  [18-94] 41  (12/22 0800) BP: (86-148)/(43-89) 136/73 mmHg (12/22 0800) SpO2:  [93 %-98 %] 96 % (12/22 0800) Weight:  [185 lb 10 oz (84.2 kg)] 185 lb 10 oz (84.2 kg) (12/21 1600) Weight change:  Last BM Date: 12/06/12  Intake/Output from previous day: 12/21 0701 - 12/22 0700 In: 4050 [I.V.:1000; IV Piggyback:1450] Out: 3426 [Urine:3425; Stool:1] Intake/Output this shift: Total I/O In: -  Out: 450 [Urine:450]  PHYSICAL EXAM Gen: NAD, laying in bed, linear thinking at this time, does appear slightly anxious  CV: tachycardic without murmur. Regular  Pulm: clear Abd: soft, nontender to palpation, nondistended, + BS Ext: warm, no edema Neuro: strength 5/5 in all 4 extremities, no tremors, CN 2-12 appear grossly intact   Lab Results:  [facility] 12/07/12 0446 12/06/12 0308  WBC 9.8 19.6*  HGB 10.5* 13.9  HCT 29.0* 38.2*  PLT 133* 185   BMET  Basename 12/07/12 0446 12/06/12 1813  NA 137 134*  K 3.8 3.9  CL 109 103  CO2 19 20  GLUCOSE 142* 163*  BUN 14 22  CREATININE 0.90 1.04  CALCIUM 7.7* 8.0*    Studies/Results: Ct Abdomen Pelvis Wo Contrast  12/06/2012  *RADIOLOGY REPORT*  Clinical Data: Abdominal tenderness  CT ABDOMEN AND PELVIS WITHOUT CONTRAST  Technique:  Multidetector CT imaging of the abdomen and pelvis was performed following the standard protocol without intravenous contrast.  Comparison: 12/07/2008  Findings: Degraded by  respiratory motion.  Mild right lung base opacity.  Heart size within normal limits.  Coronary artery calcification.  Examination degraded by extremity positioning, causing streak artifact.  Organ abnormality/lesion detection is limited in the absence of intravenous contrast. Within these limitations, unremarkable liver, biliary system, spleen, pancreas, adrenal glands.  Symmetric renal size.  No hydronephrosis or hydroureter.  Colonic wall thickening, most pronounced along the splenic flexure though to a lesser extent may also involve the rectosigmoid colon and descending colon.  No free intraperitoneal air or fluid.  No lymphadenopathy.  No bowel obstruction.  There is scattered atherosclerotic calcification of the aorta and its branches. No aneurysmal dilatation.  Thin-walled bladder.  Small fat containing left inguinal hernia.  No acute osseous finding.  IMPRESSION: Nonspecific colonic wall thickening, most pronounced within the splenic flexure. Differential includes infectious, inflammatory, and ischemic etiologies, including pseudomembranous colitis. Recommend colonoscopy follow-up to exclude underlying lesion.   Original Report Authenticated By: Jearld Lesch, M.D.    Ct Head Wo Contrast  12/06/2012  *RADIOLOGY REPORT*  Clinical Data: Altered mental status  CT HEAD WITHOUT CONTRAST  Technique:  Contiguous axial images were obtained from the base of the skull through the vertex without contrast.  Comparison: 01/30/2012  Findings: There is no evidence for acute hemorrhage, hydrocephalus, mass lesion, or abnormal extra-axial fluid collection.  No definite CT evidence for acute infarction.  The The visualized paranasal sinuses and mastoid air cells are predominately clear.  IMPRESSION: No acute  intracranial abnormality.   Original Report Authenticated By: Jearld Lesch, M.D.    Dg Chest Portable 1 View  12/06/2012  *RADIOLOGY REPORT*  Clinical Data: Weakness, diarrhea.  PORTABLE CHEST - 1 VIEW   Comparison: 04/25/2012  Findings: Mild right lung base opacity and hemidiaphragm elevation. Cardiomediastinal contours within normal range.  No pleural effusion or pneumothorax.  No acute osseous finding.  Multilevel degenerative change.  IMPRESSION: Mild right lung base opacity, favor atelectasis.   Original Report Authenticated By: Jearld Lesch, M.D.     Medications:  Scheduled:   . aspirin EC  81 mg Oral QHS  . clonazePAM  1 mg Oral BID  . doxepin  150 mg Oral QHS  . famotidine  10 mg Oral Daily  . folic acid  1 mg Oral Daily  . heparin  5,000 Units Subcutaneous Q8H  . LORazepam  0-4 mg Intravenous Q6H   Followed by  . LORazepam  0-4 mg Intravenous Q12H  . metoprolol tartrate  25 mg Oral BID  . metroNIDAZOLE  500 mg Intravenous Q8H  . multivitamin with minerals  1 tablet Oral Daily  . pantoprazole  40 mg Oral Daily  . phenytoin  200 mg Oral QHS  . piperacillin-tazobactam (ZOSYN)  IV  3.375 g Intravenous Q8H  . simvastatin  40 mg Oral QHS  . sodium chloride  1,000 mL Intravenous Once  . thiamine  100 mg Oral Daily   Or  . thiamine  100 mg Intravenous Daily  . vancomycin  1,250 mg Intravenous Q12H   Continuous:  WUJ:[id number], LORazepam, LORazepam, LORazepam  Assessment/Plan: [name] is 56 y.o. man with a history of schizophrenia, seizures, anxiety and depression which presented [facility] ED with a 2 day history of abdominal pain, nausea with diarrhea. Found to have metabolic acidosis, tachycardia and fever.   #Abd pain/diarrhea: Infectious etiology seems most likely with his febrile state with leukocytosis and left shift, and CT of the abdomen shows nonspecific colonic wall thickening-colitis. However, there is also concern for possible serotonin syndrome given patient's high doses of doxepin, seroquel  - Continue IV vanc/zosyn/flagyl in setting of SIRS criteria; WBC has improved from 19.8 to 9.9, also with improvement in pain but still with tachycardia and tachypnea  -  Repeat lactic acid improved from 3 to 1.1 - Zofran for nausea - Protonix and pepcid   - Tylenol for pain and fever  - Rotavirus and Influenza PCR negative  - Clear liquids --> will advance to full liquids and see if patient tolerates  - May need outpatient colonoscopy to evaluate source.   #Psych/neuro/AMS: clear mentation on arrival. Given his psychiatric history and elevated doses of Seroquel 900 mg, doxepin 900 mg, Xanax and klonopin, this event could very well be an accidental overdose or withdrawl. CT of the head was normal.  No changes in med doses, but pt did participate in a 2 day fast  - Anxiety: Hold Xanax and substituted with ativan 0.5 mg TID PRN  - Restart klonopin 1mg  BID -- increase to 2mg  BID while holding other home medicines (home dose 5mg  BID) hold parameter for BP <100/50  - Depression: hold Doxepin 900 mg, will give 150mg  qhs  - Schizophrenia: hold Seroquel 900 mg  - Seizures: phenytoin 200 mg -level therapeutic will continue.  - CIWA protocol in case of overdose/withdraw  - Consult toxicology to discuss potential for serotonin syndrome or NMS  - Consider consulting Psych to discuss medication changes although patient will likely not be  amenable to this.   #Fever with leukocytosis: SIRS criteria.  - Continue empiric abx vanc/zosyn/flagyl  - Follow up blood cultures - Monitor on telemetry in SDU - Repeat CBC shows WBC improved to 9.9    #Metabolic acidosis with a Anion gap of 13.  - ABG showing pH 7.43, CO2 28 - Repeat BMP shows no gap today (8)  #HTN, Tachycardia:  - Restart Metoprolol, as rebound may be contributing to tachycardia - Treat fevers with Tylenol as this may also be contributing to tachycardia   #FEN/GI:  - 125cc/hr NS  - Advance to full liquid diet, advance further if he tolerates   - Hypocalcemia to 7.7, monitor for now  Ppx: SQ heparin  Code: full  DISPO: Pending stabilization of labs and clinical symptoms. In SDU pending improvement.    LOS: 1 day   Laquentin Loudermilk 12/07/2012, 8:27 AM PGY-3

## 2012-12-07 NOTE — Progress Notes (Signed)
ANTIBIOTIC CONSULT NOTE - FOLLOW UP  Pharmacy Consult for Vanc + Zosyn Indication: Empiric abdominal coverage  Allergies  Allergen Reactions  . Sulfa Antibiotics Nausea And Vomiting    Patient Measurements: Height: 5\' 11"  (180.3 cm) Weight: 185 lb 10 oz (84.2 kg) IBW/kg (Calculated) : 75.3   Vital Signs: Temp: 100.1 F (37.8 C) (12/22 0600) BP: 117/68 mmHg (12/22 0500) Pulse Rate: 113  (12/22 0600) Intake/Output from previous day: 12/21 0701 - 12/22 0700 In: 3850 [I.V.:1000; IV Piggyback:1250] Out: 3426 [Urine:3425; Stool:1] Intake/Output from this shift:    Labs:  Basename 12/07/12 0446 12/06/12 1813 12/06/12 0615 12/06/12 0308  WBC 9.8 -- -- 19.6*  HGB 10.5* -- -- 13.9  PLT 133* -- -- 185  LABCREA -- -- -- --  CREATININE 0.90 1.04 1.58* --   Estimated Creatinine Clearance: 95.3 ml/min (by C-G formula based on Cr of 0.9). No results found for this basename: VANCOTROUGH:2,VANCOPEAK:2,VANCORANDOM:2,GENTTROUGH:2,GENTPEAK:2,GENTRANDOM:2,TOBRATROUGH:2,TOBRAPEAK:2,TOBRARND:2,AMIKACINPEAK:2,AMIKACINTROU:2,AMIKACIN:2, in the last 72 hours   Microbiology: Recent Results (from the past 720 hour(s))  MRSA PCR SCREENING     Status: Normal   Collection Time   12/06/12  3:49 PM      Component Value Range Status Comment   MRSA by PCR NEGATIVE  NEGATIVE Final     Anti-infectives     Start     Dose/Rate Route Frequency Ordered Stop   12/06/12 1930   vancomycin (VANCOCIN) 750 mg in sodium chloride 0.9 % 150 mL IVPB        750 mg 150 mL/hr over 60 Minutes Intravenous Every 12 hours 12/06/12 1909     12/06/12 1915  piperacillin-tazobactam (ZOSYN) IVPB 3.375 g       3.375 g 12.5 mL/hr over 240 Minutes Intravenous Every 8 hours 12/06/12 1847     12/06/12 1900   metroNIDAZOLE (FLAGYL) IVPB 500 mg        500 mg 100 mL/hr over 60 Minutes Intravenous Every 8 hours 12/06/12 1848     12/06/12 0430  piperacillin-tazobactam (ZOSYN) IVPB 3.375 g       3.375 g 12.5 mL/hr over 240  Minutes Intravenous  Once 12/06/12 0425 12/06/12 0850   12/06/12 0430   vancomycin (VANCOCIN) IVPB 1000 mg/200 mL premix        1,000 mg 200 mL/hr over 60 Minutes Intravenous  Once 12/06/12 0425 12/06/12 0850          Assessment: 56 y.o. M on Vancomycin + Zosyn + Flagyl for empiric abdominal coverage after presenting to the [facility] on 12/21 with abdominal pain, nausea, and diarrhea. On admission the patient was noted to have some degree of ARI likely related to dehydration from diarrhea, now resolved with fluids. SCr 0.91, CrCl~85-95 ml/min. Will adjust Vancomycin dose today for improving renal function  Please note that Zosyn and Flagyl both cover anaerobes -- so Flagyl not adding much additional coverage to the regimen. Please consider stopping.  Goal of Therapy:  Vancomycin trough level 15-20 mcg/ml Proper antibiotics for infection/cultures adjusted for renal/hepatic function   Plan:  1. Increase Vancomycin to 1250 mg IV every 12 hours 2. Continue Zosyn 3.375g IV every 8 hours 3. Will continue to follow renal function, culture results, LOT, and antibiotic de-escalation plans   Georgina Pillion, PharmD, BCPS Clinical Pharmacist Pager: [phone] 12/07/2012 8:10 AM

## 2012-12-07 NOTE — Progress Notes (Signed)
S:  Patient awake, alert.  Still with some abdominal pain but greatly decreased since admisssion.  1 episode diarrhea his AM, also greatly decreased.  No vomiting, tolerating clear liquids well.  Of note, patient started 2 day fast of everything EXCEPT medications 2 days prior to admission.    O: Gen:  Alert, pleasant, NAD HEENT:  PERRL, EOMI.  Clear drainage from eyes BL.  No nystagmus noted.  MMM.   Neck:  SUpple Heart:  Tachy, regular rhythm Lungs:  Tachypneic.  Some accessory muscle use noted Abdomen:  TTP mild LLQ.  Otherwise without tenderness.  Increased BS.  No guarding or rebound. Neuro:  CN II- XII intact.  No essential or intention tremor noted.  Some hesitancy with finger-to-nose, but did well with testing overall.  No clonus.  No nystagmus.  Strength 5/5 without muscular rigidity.  Psych:  Pleasant, conversant, linear thought process.  No hallucinations.   Impression:  1.  Fever 2.  Abdominal pain 3.  Tachycardia 4.  Tachypnea 5.  Labile blood pressure 6.  High dose antipsychotic usage  Recommendations: - Fevers, tachycardia, tachypnea ersist despite 24 hours antibiotics - Concern is for serotonin syndrome versus infectious/inflammatory colitis with systemic syptoms versus neuroleptic malignant syndrome.   - No muscular rigidity, clonus, nystagmus.  Does not quite qualify for serotonin syndrome based on Hunter Criteria -- however only minimal improvement with antibiotics at noted above.  On very high dose antipsychotics without any other PO intake x 2 days.   - Will consult toxicology and psychiatry for further recommendations. - Initiate antipyretics (has only received 1 dose of Tylenol that I can see) - Restart Metoprolol -- tachycardia possibly secondary to rebound from stopping this medication.   - Advance to full liquids to see if he tolerates this - Hold any potentially serotonergic drugs.  Will need to continue benzo's for treatment.   - Keep in step-down

## 2012-12-07 NOTE — Progress Notes (Signed)
Updated progress note:  Called and discussed case with [place] [facility].  They do not feel that this is Serotonin Syndrome or otherwise related to his Psych medications.  They advise continued infection work up.  -Will restart patient's home Doxepin tonight but wait to restart Seroquel until tomorrow (12/23) PM. -Will continue to monitor closely for changes in status -Tylenol PRN fever, likely contributing to tachycardia -Metoprolol restarted  [name] 12/07/2012, 12:54 PM

## 2012-12-07 NOTE — Progress Notes (Signed)
Pt tachypneic, resp rate 45 sustaining , lungs diminished with bilat upper lobe wheezing. Ativan admin,pt placed in high-Fowler's, vitals signs assessed.  MD notified of changes, will continue to monitor pt and evaluate effectiveness of treatment.

## 2012-12-07 NOTE — H&P (Signed)
FMTS Attending Admission Note: Renold Don MD Personal pager:  [phone] FPTS Service Pager:  [phone]  I  have seen and examined this patient, reviewed their chart. I have discussed this patient with the resident. I agree with the resident's findings, assessment and care plan.  Please see my separate note.  Concern for infectious etiology versus serotonin syndrome.

## 2012-12-07 NOTE — Progress Notes (Signed)
FMTS Attending Daily Note:  Renold Don MD  [phone] pager  Family Practice pager:  [phone] I have seen and examined this patient and have reviewed their chart. I have discussed this patient with the resident. I agree with the resident's findings, assessment and care plan.  Please see separate note for details.

## 2012-12-08 ENCOUNTER — Inpatient Hospital Stay (HOSPITAL_COMMUNITY): Payer: Medicaid Other

## 2012-12-08 LAB — CBC
HCT: 27.5 % — ABNORMAL LOW (ref 39.0–52.0)
Hemoglobin: 9.9 g/dL — ABNORMAL LOW (ref 13.0–17.0)
MCH: 32.4 pg (ref 26.0–34.0)
MCHC: 36 g/dL (ref 30.0–36.0)
MCV: 89.9 fL (ref 78.0–100.0)
Platelets: 131 10*3/uL — ABNORMAL LOW (ref 150–400)
RBC: 3.06 MIL/uL — ABNORMAL LOW (ref 4.22–5.81)
RDW: 13.6 % (ref 11.5–15.5)
WBC: 9.5 10*3/uL (ref 4.0–10.5)

## 2012-12-08 LAB — BASIC METABOLIC PANEL
BUN: 7 mg/dL (ref 6–23)
CO2: 22 mEq/L (ref 19–32)
Calcium: 7.8 mg/dL — ABNORMAL LOW (ref 8.4–10.5)
Chloride: 110 mEq/L (ref 96–112)
Creatinine, Ser: 0.79 mg/dL (ref 0.50–1.35)
GFR calc Af Amer: >90 mL/min (ref >90)
GFR calc non Af Amer: >90 mL/min (ref >90)
Glucose, Bld: 103 mg/dL — ABNORMAL HIGH (ref 70–99)
Potassium: 3.1 mEq/L — ABNORMAL LOW (ref 3.5–5.1)
Sodium: 139 mEq/L (ref 135–145)

## 2012-12-08 MED ORDER — CIPROFLOXACIN HCL 500 MG PO TABS
500.0000 mg | ORAL_TABLET | Freq: Two times a day (BID) | ORAL | Status: DC
  Administered 2012-12-08 – 2012-12-09 (×2): 500 mg via ORAL
  Filled 2012-12-08 (×4): qty 1

## 2012-12-08 MED ORDER — METOPROLOL TARTRATE 50 MG PO TABS
50.0000 mg | ORAL_TABLET | Freq: Two times a day (BID) | ORAL | Status: DC
  Administered 2012-12-08 – 2012-12-09 (×3): 50 mg via ORAL
  Filled 2012-12-08 (×4): qty 1

## 2012-12-08 MED ORDER — QUETIAPINE FUMARATE 300 MG PO TABS
300.0000 mg | ORAL_TABLET | Freq: Every day | ORAL | Status: DC
  Administered 2012-12-08: 300 mg via ORAL
  Filled 2012-12-08 (×2): qty 1

## 2012-12-08 MED ORDER — SODIUM CHLORIDE 0.9 % IV BOLUS (SEPSIS)
1000.0000 mL | Freq: Once | INTRAVENOUS | Status: AC
  Administered 2012-12-08: 1000 mL via INTRAVENOUS

## 2012-12-08 MED ORDER — POTASSIUM CHLORIDE CRYS ER 20 MEQ PO TBCR
20.0000 meq | EXTENDED_RELEASE_TABLET | Freq: Once | ORAL | Status: AC
  Administered 2012-12-08: 20 meq via ORAL
  Filled 2012-12-08: qty 1

## 2012-12-08 MED ORDER — QUETIAPINE FUMARATE 300 MG PO TABS
300.0000 mg | ORAL_TABLET | Freq: Once | ORAL | Status: AC
  Administered 2012-12-08: 300 mg via ORAL
  Filled 2012-12-08: qty 1

## 2012-12-08 MED ORDER — POTASSIUM CHLORIDE 10 MEQ/100ML IV SOLN
10.0000 meq | INTRAVENOUS | Status: AC
  Administered 2012-12-08 (×3): 10 meq via INTRAVENOUS
  Filled 2012-12-08: qty 300
  Filled 2012-12-08: qty 100

## 2012-12-08 MED ORDER — METRONIDAZOLE 500 MG PO TABS
500.0000 mg | ORAL_TABLET | Freq: Two times a day (BID) | ORAL | Status: DC
  Administered 2012-12-08 – 2012-12-09 (×3): 500 mg via ORAL
  Filled 2012-12-08 (×4): qty 1

## 2012-12-08 NOTE — Progress Notes (Signed)
Pt's left arm is swollen and harder. Pt says that he has never had his arm like that before. IV fluids has been d/c's to that arm.

## 2012-12-08 NOTE — Discharge Summary (Signed)
Physician Discharge Summary  Patient ID: [name] MRN: [id number] DOB/AGE: 09-03-1956 56 y.o.  Admit date: 12/06/2012 Discharge date: 12/08/2012  Admission Diagnoses: Abdominal pain, nausea, diarrhea  Discharge Diagnoses:  Infectious gastroenteritis  Discharged Condition: stable  Hospital Course: This is a 56 YOM with a significant psychiatric history of schizophrenia and anxiety and depression who presented to [facility] ED on 12/21 with a 2-day history of abdominal pain, nausea, and diarrhea. He was found to have metabolic acidosis, tachycardia, and fever. Infectious gastroenteritis seems most likely. CT of the abdomen shows nonspecific colonic wall thickening-colitis. Rotavirus and Influenza PCR negative. Fecal lactoferrin was in process at the time of discharge. He was started on vancomycin/Flagyl/Zosyn at the time of admission, and thi was narrowed to Flagyl and ciprofloxacin. His last fever was on 12/22, and he remained afebrile despite the change in antibiotics. Metabolic acidosis and leukocytosis resolved soon after admission. At the time of discharge, his nausea and diarrhea resolved and his abdominal pain had improved; he had only received Tylenol during hospitalization for pain and fever and required 1 dose day prior to discharge. He was tolerating his regular diet well.  There was initial concern for serotonin syndrome given patient's high home doses of doxepin and Seroquel, however, Poison Control consulted 12/22 and thought this was unlikely. His medications were gradually re-started during hospitalization.   Based on CT-abdomen findings, radiologist recommended colonoscopy to evaluate non-specific colonic lesions.   CV/PULM  # Tachycardia. 12/22 ECG: tachycardia but otherwise normal. He has a history of tachycardia with HR 110s at last OV September and November, although normal before that in July 2013. TSH 07/2012 2.512.  His tachycardia improved since re-starting his home dose  of metoprolol and with fluid boluses.  SOCIAL Patient lives alone despite his significant psychiatric co-morbidities. He is alert and oriented, however, we are concerned regarding his home situation and have requested case manager to arrange home health evaluation of home and assess for any needs, referring any recommendations to PCP Dr. Elwyn Re[facility] at [facility].   Follow-up issues: -Please arrange for outpatient colonoscopy.  -Please make sure he has routine follow-up with his psychiatrist.  -Re-check potassium.  -Any home health needs per home health?   Consults: none  Disposition: 01-Home or Self Care  Discharge Orders    Future Appointments: Provider: Department: Dept Phone: Center:   12/24/2012 1:45 PM Phebe Colla, MD [facility] [phone] [facility]       Medication List     As of 12/08/2012  1:00 PM    ASK your doctor about these medications         ALPRAZolam 1 MG tablet   Commonly known as: XANAX   Take 1 mg by mouth 3 (three) times daily.      aspirin EC 81 MG tablet   Take 81 mg by mouth at bedtime.      clonazePAM 2 MG tablet   Commonly known as: KLONOPIN   Take 5 mg by mouth 2 (two) times daily.      doxepin 150 MG capsule   Commonly known as: SINEQUAN   Take 900 mg by mouth at bedtime.      metoprolol 50 MG tablet   Commonly known as: LOPRESSOR   Take 1 tablet (50 mg total) by mouth 2 (two) times daily.      multivitamin with minerals Tabs   Take 1 tablet by mouth daily.      omeprazole 20 MG capsule   Commonly known  as: PRILOSEC   TAKE 1 CAPSULE EVERY DAY      phenytoin 50 MG tablet   Commonly known as: DILANTIN   Chew 200 mg by mouth at bedtime.      polyethylene glycol packet   Commonly known as: MIRALAX / GLYCOLAX   Take 17 g by mouth daily as needed. For constipation. Mix into 8 ounces of fluid & drink      QUEtiapine 300 MG tablet   Commonly known as: SEROQUEL   Take 900 mg by mouth at bedtime.      simvastatin 40 MG tablet    Commonly known as: ZOCOR   Take 40 mg by mouth at bedtime.      testosterone cypionate 100 MG/ML injection   Commonly known as: DEPOTESTOTERONE CYPIONATE   Inject 4 mLs (400 mg total) into the muscle every 14 (fourteen) days.        Signed: [place] [name], [name] Stalker 12/08/2012, 1:00 PM

## 2012-12-08 NOTE — Progress Notes (Signed)
Update: He has not had any bowel movements today.  He is tolerating his diet without problem.  He still complains of tachypnea and has asked the nurse to put oxygen on him although his oxygen saturations have been in the uppers-90s. He reports that he "cannot take deep breaths".   PLAN:  -We will give him a dose of Seroquel now. 300 mg.  -He is due for his dose of Klonopin. He is getting 2 mg bid; he is on 5 mg bid at home. We will consider increasing dose.  -CXR reviewed and show bilateral atelectasis. He was encouraged to use incentive spirometer.  -Anticipate discharge tomorrow.  -His tachycardia has improved since receiving the fluid bolus and home dose of metoprolol. We will transfer him out of step-down and monitor on telemetry.

## 2012-12-08 NOTE — Progress Notes (Signed)
Seen and examined.  Discussed with Dr. Madolyn Frieze.  Agree with her management.  Briefly,  I know [name] from multiple previous admits.  His schizophrenia makes his subjective complaints unreliable and thus we must focus more on objective findings.  Those objective findings point to colitis (presumed infectious) which seems to be improving.  We are restarting his home psych meds.  He still complains of abd pain and watery stools.  I believe he is improving and may in fact may be approaching DC.  DC tomorrow is a distinct possibility.

## 2012-12-08 NOTE — Progress Notes (Signed)
Pt temp is 99.7. Pt asked for PRN tylenol. 2 325mg  tabs given

## 2012-12-08 NOTE — Progress Notes (Signed)
PGY 1 Update: Pt c/o some LUE pain at his IV site.  A/P Pt has full sensation, no numbness or tingling, and full MS of the LUE.  Most likely a hematoma at the area.  If starts to c/o numbness tingling or weakness, will get UE doppler to evaluate for VTE.  Twana First Paulina Fusi, DO of [facility] 12/08/2012, 9:45 PM

## 2012-12-08 NOTE — Progress Notes (Signed)
Family Practice Teaching Service  Daily Progress Note Pager [phone]  Subjective: He reports persistent right lower quadrant abdominal pain. He had 3 episodes of diarrhea yesterday but none today; he is not sure if this is getting better.    Objective: Vital signs in last 24 hours: Temp:  [98.5 F (36.9 C)-101.2 F (38.4 C)] 98.8 F (37.1 C) (12/23 0801) Pulse Rate:  [98-121] 106  (12/23 0801) Resp:  [18-41] 25  (12/23 0801) BP: (100-141)/(55-97) 111/60 mmHg (12/23 0801) SpO2:  [94 %-100 %] 96 % (12/23 0801) Weight:  [186 lb 1.1 oz (84.4 kg)] 186 lb 1.1 oz (84.4 kg) (12/23 0016) Weight change: 7.1 oz (0.2 kg) Last BM Date: 12/07/12  Intake/Output from previous day: 12/22 0701 - 12/23 0700 In: 2977.1 [I.V.:2077.1; IV Piggyback:900] Out: 2675 [Urine:2675] No bowel movements documented 12/22-12/23.   PHYSICAL EXAM Gen: NAD, laying in bed, linear thinking at this time, does appear slightly anxious  HEENT: dry MM CV: tachycardic without murmur, regular rhythm Pulm: tachypneic; CTAB without wheezes, rales, ronchi Abd: NABS, soft, mild tenderness throughout but he says it is most tender RLQ, distended Ext: warm, no edema Neuro: grossly intact, no tremors  Lab Results:  [name] 12/08/12 0453 12/07/12 0446  WBC 9.5 9.8  HGB 9.9* 10.5*  HCT 27.5* 29.0*  PLT 131* 133*   BMET  Basename 12/08/12 0453 12/07/12 0446  NA 139 137  K 3.1* 3.8  CL 110 109  CO2 22 19  GLUCOSE 103* 142*  BUN 7 14  CREATININE 0.79 0.90  CALCIUM 7.8* 7.7*   Studies/Results: No results found.  Medications:  Scheduled:    . aspirin EC  81 mg Oral QHS  . clonazePAM  2 mg Oral BID  . doxepin  900 mg Oral QHS  . famotidine  10 mg Oral Daily  . folic acid  1 mg Oral Daily  . heparin  5,000 Units Subcutaneous Q8H  . LORazepam  0-4 mg Intravenous Q6H   Followed by  . LORazepam  0-4 mg Intravenous Q12H  . metoprolol tartrate  25 mg Oral BID  . metroNIDAZOLE  500 mg Intravenous Q8H  .  multivitamin with minerals  1 tablet Oral Daily  . pantoprazole  40 mg Oral Daily  . phenytoin  200 mg Oral QHS  . piperacillin-tazobactam (ZOSYN)  IV  3.375 g Intravenous Q8H  . simvastatin  40 mg Oral QHS  . thiamine  100 mg Oral Daily   Or  . thiamine  100 mg Intravenous Daily  . vancomycin  1,250 mg Intravenous Q12H   Continuous:    . sodium chloride 125 mL/hr at 12/08/12 1914   NWG:[id number], LORazepam, LORazepam, LORazepam Tylenol x 3  Lorazepam x 0   Assessment/Plan: [name] is 16 YOM with a significant psychiatric history of schizophrenia and anxiety and depression who presented [name] on 12/21 with a 2-day history of abdominal pain, nausea, and diarrhea. He was found to have metabolic acidosis, tachycardia, and fever.   GI/ID # Abdominal pain, nausea, diarrhea. Infectious gastroenteritis seems most likely with admission WBC of 19.6 and fever. CT of the abdomen shows nonspecific colonic wall thickening-colitis. Rotavirus and Influenza PCR negative. There was initial concern for serotonin syndrome given patient's high home doses of doxepin and Seroquel, however, Poison Control consulted 12/22 and thought this was unlikely.  -He is on IV vanc/Zosyn/Flagyl. His WBC had normalized on 12/22. His last fever was 12/22 @ 20:30.>>>Consider narrowing antibiotic coverage with ciprofloxacin and Flagyl.  -  Fecal lactoferrin pending.  -He denies nausea and has not required any prn Zofran. He is tolerating his regular diet.  -Continue Protonix and Pepcid -Tylenol for pain and fever  -Blood culture x 2>>>negative -Radiologist who read his CT recommends colonoscopy. We will recommend as outpatient.  # Metabolic acidosis on admission with AG 13. Resolved HD#2.   PSYCH # History of schizophrenia, anxiety and depression. Clear mentation on arrival. He had not taken psychiatric or any medications 2-days prior to admission while on detoxification/fast. CT-head normal.  # History of  seizures. Stable. Dilantin level therapeutic on admission.  -Home medications: Xanax 1 tid, doxepin 900 mg qhs, Dilantin 200 qhs, Seroquel 900 qhs. He is now on Klonopin 2 bid, doxepin 900 qhs, Dilantin 200 qhs. We will re-start Seroquel starting with 300 mg, then 600, then 900; we had been holding due to concern for serotonin syndrome.  -CIWA. He has not required any prn Ativan.   CV/PULM # Tachycardia. Persistent. 12/22 ECG: tachycardia but otherwise normal. He has a history of tachycardia with HR 110s at last OV September and November, although normal before that in July 2013. TSH 07/2012 2.512.  -He has been restarted on half home dose of metoprolol. We will increase to full home dose at 50 bid.  -We will give 1 L NS fluid bolus and encourage PO. He appears dehydrated.  # Tachypneic. Normal O2 saturations on RA. He denies cough or shortness-of-breath.  -We will check CXR. Psychogenic etiology also possible.   FEN  # IVF: NS @ 125 mL/hr. Fluid bolus 1 L now.  # Hypokalemia. 12/23 3.1.  -Replete with total 50 mEq. Re-check in AM.  # Hypocalcemia to 7.7. Monitor for now.   PPx -DVT PPx: heparin SQ   Code -FULL  Disposition: pending clinical improvement--stable GI symptoms and tachycardia. Anticipate D/C later this afternoon or tomorrow.  -He will need outpatient colonoscopy due to abnormal CT-abdomen results. -Outpatient providers include Dr. Elwyn [name] at [name] and a psychiatrist.    LOS: 2 days    [name], [name] 12/08/2012, 9:03 AM

## 2012-12-08 NOTE — Progress Notes (Signed)
Had doctor come up to see pt's swollen arm, told to watch arm for any more swelling and report if pt's arm feels numb or tingling in hands.

## 2012-12-09 LAB — BASIC METABOLIC PANEL
BUN: 6 mg/dL (ref 6–23)
CO2: 21 mEq/L (ref 19–32)
Calcium: 8 mg/dL — ABNORMAL LOW (ref 8.4–10.5)
Chloride: 105 mEq/L (ref 96–112)
Creatinine, Ser: 0.66 mg/dL (ref 0.50–1.35)
GFR calc Af Amer: >90 mL/min (ref >90)
GFR calc non Af Amer: >90 mL/min (ref >90)
Glucose, Bld: 107 mg/dL — ABNORMAL HIGH (ref 70–99)
Potassium: 3.3 mEq/L — ABNORMAL LOW (ref 3.5–5.1)
Sodium: 137 mEq/L (ref 135–145)

## 2012-12-09 LAB — FECAL LACTOFERRIN, QUANT: Fecal Lactoferrin: POSITIVE

## 2012-12-09 LAB — CBC
HCT: 29.6 % — ABNORMAL LOW (ref 39.0–52.0)
Hemoglobin: 10.6 g/dL — ABNORMAL LOW (ref 13.0–17.0)
MCH: 31.9 pg (ref 26.0–34.0)
MCHC: 35.8 g/dL (ref 30.0–36.0)
MCV: 89.2 fL (ref 78.0–100.0)
Platelets: 157 10*3/uL (ref 150–400)
RBC: 3.32 MIL/uL — ABNORMAL LOW (ref 4.22–5.81)
RDW: 13.8 % (ref 11.5–15.5)
WBC: 9.8 10*3/uL (ref 4.0–10.5)

## 2012-12-09 MED ORDER — CIPROFLOXACIN HCL 500 MG PO TABS: 500.0000 mg | ORAL_TABLET | Freq: Two times a day (BID) | ORAL | Status: DC

## 2012-12-09 MED ORDER — METRONIDAZOLE 500 MG PO TABS: 500.0000 mg | ORAL_TABLET | Freq: Two times a day (BID) | ORAL | Status: DC

## 2012-12-09 MED ORDER — QUETIAPINE FUMARATE 300 MG PO TABS
600.0000 mg | ORAL_TABLET | Freq: Every day | ORAL | Status: DC
  Filled 2012-12-09: qty 2

## 2012-12-09 MED ORDER — CLONAZEPAM 1 MG PO TABS
5.0000 mg | ORAL_TABLET | Freq: Two times a day (BID) | ORAL | Status: DC
  Administered 2012-12-09: 5 mg via ORAL
  Filled 2012-12-09: qty 5

## 2012-12-09 MED ORDER — POTASSIUM CHLORIDE CRYS ER 20 MEQ PO TBCR
40.0000 meq | EXTENDED_RELEASE_TABLET | Freq: Once | ORAL | Status: AC
  Administered 2012-12-09: 40 meq via ORAL
  Filled 2012-12-09: qty 2

## 2012-12-09 MED ORDER — QUETIAPINE FUMARATE 300 MG PO TABS
300.0000 mg | ORAL_TABLET | Freq: Once | ORAL | Status: AC
  Administered 2012-12-09: 300 mg via ORAL
  Filled 2012-12-09: qty 1

## 2012-12-09 NOTE — Care Management Note (Unsigned)
    Page 1 of 1   12/09/2012     2:38:37 PM   CARE MANAGEMENT NOTE 12/09/2012  Patient:  [name], [name]   Account Number:  [id number]  Date Initiated:  12/09/2012  Documentation initiated by:  Letha Cape  Subjective/Objective Assessment:   dx diarrhea, colitis, metabolic, acidosis, renal failure  admit-lives alone.  pta indpendent.     Action/Plan:   Anticipated DC Date:  12/09/2012   Anticipated DC Plan:  HOME/SELF CARE  In-house referral  Clinical Social Worker      DC Planning Services  CM consult      Choice offered to / List presented to:             Status of service:  Completed, signed off Medicare Important Message given?   (If response is "NO", the following Medicare IM given date fields will be blank) Date Medicare IM given:   Date Additional Medicare IM given:    Discharge Disposition:  HOME/SELF CARE  Per UR Regulation:  Reviewed for med. necessity/level of care/duration of stay  If discussed at Long Length of Stay Meetings, dates discussed:    Comments:  12/09/12 14:34 Letha Cape RN, BSN [phone] patient lives alone, received referral for hh services assessment, patient is not home bound, he drives himself everywhere he needs to go and it is not taxing for him to get out of the house.  Informed MD of this information. Patient is for dc today to home, he states he needs transportation, CSW referral. CSW working on transport.

## 2012-12-09 NOTE — Progress Notes (Signed)
Seen and examined.  Discussed with Dr. Madolyn Frieze.  Agree with her documentation and management.  [name] feels better but not yet well.  Tolerating PO and back on his routine psych meds.  Agree with DC today.  My only concern is that he lives alone and that sounds like a dicey proposition.  He assures me that he does well at home and I don't have any evidence to the contrary.  OK to DC.

## 2012-12-09 NOTE — Progress Notes (Signed)
NURSING PROGRESS NOTE  [name] [id number] Discharge Data: 12/09/2012 3:19 PM Attending Provider: No att. providers found WUJ:[id number], Denny Peon, MD     [name] to be D/C'd Home per MD order.  Discussed with the patient the After Visit Summary and all questions fully answered. All IV's discontinued with no bleeding noted. All belongings returned to patient for patient to take home.   Last Vital Signs:  Blood pressure 145/84, pulse 113, temperature 98.7 F (37.1 C), temperature source Oral, resp. rate 22, height 5\' 11"  (1.803 m), weight 84.4 kg (186 lb 1.1 oz), SpO2 98.00%.  Discharge Medication List   Medication List     As of 12/09/2012  3:19 PM    TAKE these medications         ALPRAZolam 1 MG tablet   Commonly known as: XANAX   Take 1 mg by mouth 3 (three) times daily.      aspirin EC 81 MG tablet   Take 81 mg by mouth at bedtime.      ciprofloxacin 500 MG tablet   Commonly known as: CIPRO   Take 1 tablet (500 mg total) by mouth 2 (two) times daily.      clonazePAM 2 MG tablet   Commonly known as: KLONOPIN   Take 5 mg by mouth 2 (two) times daily.      doxepin 150 MG capsule   Commonly known as: SINEQUAN   Take 900 mg by mouth at bedtime.      metoprolol 50 MG tablet   Commonly known as: LOPRESSOR   Take 1 tablet (50 mg total) by mouth 2 (two) times daily.      metroNIDAZOLE 500 MG tablet   Commonly known as: FLAGYL   Take 1 tablet (500 mg total) by mouth every 12 (twelve) hours.      multivitamin with minerals Tabs   Take 1 tablet by mouth daily.      omeprazole 20 MG capsule   Commonly known as: PRILOSEC   TAKE 1 CAPSULE EVERY DAY      phenytoin 50 MG tablet   Commonly known as: DILANTIN   Chew 200 mg by mouth at bedtime.      polyethylene glycol packet   Commonly known as: MIRALAX / GLYCOLAX   Take 17 g by mouth daily as needed. For constipation. Mix into 8 ounces of fluid & drink      QUEtiapine 300 MG tablet   Commonly known as: SEROQUEL    Take 900 mg by mouth at bedtime.      simvastatin 40 MG tablet   Commonly known as: ZOCOR   Take 40 mg by mouth at bedtime.      testosterone cypionate 100 MG/ML injection   Commonly known as: DEPOTESTOTERONE CYPIONATE   Inject 4 mLs (400 mg total) into the muscle every 14 (fourteen) days.       Patient sent home via cab and cab voucher.   Dustina Scoggin, Elmarie Mainland, RN

## 2012-12-09 NOTE — Discharge Summary (Signed)
Seen and examined.  Discussed with Dr. Madolyn Frieze.  Agree with her documentation and management.  [name] feels better but not yet well.  Tolerating PO and back on his routine psych meds.  Agree with DC today.  My only concern is that he lives alone and that sounds like a dicey proposition.  He assures me that he does well at home and I don't have any evidence to the contrary.  OK to DC as outlined by Dr. Madolyn Frieze.

## 2012-12-09 NOTE — Progress Notes (Signed)
Pt remains tachy. Pulse between 100-110. Doctor is aware.

## 2012-12-09 NOTE — Progress Notes (Signed)
12/09/12 1300  Clinical Encounter Type  Visited With Patient  Visit Type Initial;Spiritual support   Provided emotional and spiritual support for the patient. Veryl Speak

## 2012-12-09 NOTE — Progress Notes (Signed)
Family Practice Teaching Service  Daily Progress Note Pager [phone]  Subjective: He reports some abdominal pain when he moves.  He denies difficulty breathing.  He reports occasional tingling and coolness in his left upper extremity.  He was able to walk around yesterday without problem.   Objective: Vital signs in last 24 hours: Temp:  [98.7 F (37.1 C)-99.6 F (37.6 C)] 98.7 F (37.1 C) (12/24 0544) Pulse Rate:  [93-120] 94  (12/24 0544) Resp:  [22-36] 22  (12/24 0544) BP: (110-149)/(60-91) 110/71 mmHg (12/24 0544) SpO2:  [95 %-99 %] 98 % (12/24 0544) Weight change:  Last BM Date: 12/08/12  PHYSICAL EXAM Gen: NAD, asleep but arouseable, linear thinking at this time, does appear slightly anxious  CV: RRR Pulm: tachypneic; coarse breath sounds at bilateral bases that improve with coughing; no wheezing or ronchi Abd: NABS, soft, mild tenderness throughout but he says it is most tender lower quadrants, distended Ext: warm, no edema Neuro: grossly intact, no tremors MSK: right hand and forearm is cool to the touch (left side is warm), tight, and has 2 IVs attached although saline-locked; strength, sensation are intact  Lab Results: BMET and CBC pending for this AM  [facility] 12/08/12 0453 12/07/12 0446  WBC 9.5 9.8  HGB 9.9* 10.5*  HCT 27.5* 29.0*  PLT 131* 133*   BMET  Basename 12/08/12 0453 12/07/12 0446  NA 139 137  K 3.1* 3.8  CL 110 109  CO2 22 19  GLUCOSE 103* 142*  BUN 7 14  CREATININE 0.79 0.90  CALCIUM 7.8* 7.7*   Studies/Results: Dg Chest 2 View  12/08/2012  *RADIOLOGY REPORT*  Clinical Data: Tachycardia tachypnea  CHEST - 2 VIEW  Comparison: Portable chest x-ray of 12/06/2012  Findings: Linear opacities at both lung bases are most consistent with worsening of bibasilar atelectasis.  No definite pneumonia is seen and no effusion is noted.  The heart is mildly enlarged.  No acute bony abnormality is seen, with surgical screws noted within the right  humeral head and neck.  IMPRESSION: Worsening of bibasilar atelectasis.  No definite pneumonia or effusion.   Original Report Authenticated By: Dwyane Dee, M.D.     Medications:  Scheduled:    . aspirin EC  81 mg Oral QHS  . ciprofloxacin  500 mg Oral BID  . clonazePAM  5 mg Oral BID  . doxepin  900 mg Oral QHS  . famotidine  10 mg Oral Daily  . folic acid  1 mg Oral Daily  . heparin  5,000 Units Subcutaneous Q8H  . metoprolol tartrate  50 mg Oral BID  . metroNIDAZOLE  500 mg Oral Q12H  . multivitamin with minerals  1 tablet Oral Daily  . pantoprazole  40 mg Oral Daily  . phenytoin  200 mg Oral QHS  . QUEtiapine  300 mg Oral Once  . QUEtiapine  600 mg Oral QHS  . simvastatin  40 mg Oral QHS  . thiamine  100 mg Oral Daily   Or  . thiamine  100 mg Intravenous Daily   Continuous:   JYN:[id number], LORazepam, LORazepam Tylenol x 1  Lorazepam x 1    Assessment/Plan: Tuan Tippin is 23 YOM with a significant psychiatric history of schizophrenia and anxiety and depression who presented to [facility] ED on 12/21 with a 2-day history of abdominal pain, nausea, and diarrhea. He was found to have metabolic acidosis, tachycardia, and fever.   GI/ID # Abdominal pain, nausea, diarrhea. Infectious gastroenteritis seems most likely with admission  WBC of 19.6 and fever. CT of the abdomen shows nonspecific colonic wall thickening-colitis. Rotavirus and Influenza PCR negative. Fecal lactoferrin in process.  There was initial concern for serotonin syndrome given patient's high home doses of doxepin and Seroquel, however, Poison Control consulted 12/22 and thought this was unlikely.  His WBC had normalized on 12/22, and his metabolic acidosis had resolved by 12/21.  He was placed on IV vanc/Zosyn/Flagyl at the time of admission.  12/23 antibiotics were narrowed to PO Flagyl and PO ciprofloxacin.  -He has been afebrile and his abdominal pain has been improving.  -Follow-up fecal lactoferrin.  -He  denies nausea and has not required any prn Zofran. He is tolerating his regular diet.  -Continue Protonix and Pepcid -Tylenol for pain and fever. He has required fewer doses during her hospital course. Yesterday he had 1.  -Blood culture x 2>>>negative -Radiologist who read his CT recommends colonoscopy. We will recommend as outpatient.   PSYCH # History of schizophrenia, anxiety and depression. Clear mentation on arrival. He had not taken psychiatric or any medications 2-days prior to admission while on detoxification/fast. CT-head normal.  # History of seizures. Stable. Dilantin level therapeutic on admission.  -He has been anxious the past couple of days. -Home medications: Xanax 1 tid, doxepin 900 mg qhs, Dilantin 200 qhs, Seroquel 900 qhs. He is now on Klonopin 2 bid, doxepin 900 qhs, Dilantin 200 qhs; Seroquel 300 bid was started yesterday. We will continue to titrate-up Seroquel. He will receive 300 mg this morning, and then 600 mg this evening. Tomorrow he may resume his usual 900 mg. We will also increase his Klonpin back to his home dose of 5 bid.   MSK # Left upper extremity tingling and coolness. It appears that his IV had infiltrated. Fluids were stopped yesterday by Dr. Paulina Fusi the intern on my team who checked on him when notified of this complaint.  -D/C IV  CV/PULM # Tachycardia. 12/22 ECG: tachycardia but otherwise normal. He has a history of tachycardia with HR 110s at last OV September and November, although normal before that in July 2013. TSH 07/2012 2.512.  His tachycardia has improved since re-starting his home dose of metoprolol and with fluid boluses yesterday.  -Continue metoprolol 50 bid.  # Tachypneic. Normal O2 saturations on RA. He denies cough or shortness-of-breath. May be psychogenic and/or due to atelectasis. CXR 12/23 showed bilateral atelectasis. His air movement is improved this morning.    FEN  # IVF: D/C # Hypokalemia. 12/23 3.1 and supplements were given.   -Follow-up K this AM.  # Hypocalcemia to 7.7. Monitor for now.   PPx -DVT PPx: heparin SQ   Code -FULL  Disposition:  -His abdominal pain continues to improve, his diarrhea has resolved, and he has been afebrile for >24 hours. His tachycardia has also improved. We will discharge him to home today.  -He will need outpatient colonoscopy due to abnormal CT-abdomen results. -Outpatient providers include Dr. Elwyn Re[facility] at [facility] and his psychiatrist.    LOS: 3 days    OH PARK, Shamecka Hocutt 12/09/2012, 6:45 AM

## 2012-12-10 ENCOUNTER — Telehealth: Payer: Self-pay | Admitting: Emergency Medicine

## 2012-12-10 NOTE — Telephone Encounter (Signed)
Patient called the emergency line for weakness and fatigue.  He was recently discharged from the hospital 2 days ago with a likely gastroenteritis.  He is currently taking Cipro and Flagyl.  He states that he feels worse, complaining of weakness and continued diarrhea.  He is concerned that he was given the wrong antibiotic or will need a longer course.  I informed him that he is on excellent antibiotics for gastroenteritis.  I discussed with him the options of going to the ED for evaluation or calling for a SDA tomorrow morning.  I strongly encouraged him to go the ED given his report of weakness and he wasn't sure he'd be strong enough to come in to clinic tomorrow.  At this time, he is reluctant to go the ED because he does not want to wait around on a gurney for 4-5 hours.  I offered understanding of these concerns, but let him know that I could not do much over the phone without examining him.  He is planning on monitoring his temperature at home (discussed fever to above 100.5).  Strongly encouraged him to go the ED if the weakness is getting worse, he cannot keep fluids down, or he gets a fever.  Also strongly encouraged to make a SDA for tomorrow.  He expressed understanding and agreement with this plan.

## 2012-12-13 ENCOUNTER — Emergency Department (HOSPITAL_COMMUNITY)
Admission: EM | Admit: 2012-12-13 | Discharge: 2012-12-13 | Disposition: A | Discharge status: Home / Self Care | Payer: Medicaid Other | Attending: Emergency Medicine | Admitting: Emergency Medicine

## 2012-12-13 ENCOUNTER — Encounter (HOSPITAL_COMMUNITY): Payer: Self-pay | Admitting: Emergency Medicine

## 2012-12-13 DIAGNOSIS — K219 Gastro-esophageal reflux disease without esophagitis: Secondary | ICD-10-CM | POA: Insufficient documentation

## 2012-12-13 DIAGNOSIS — Z22322 Carrier or suspected carrier of Methicillin resistant Staphylococcus aureus: Secondary | ICD-10-CM | POA: Insufficient documentation

## 2012-12-13 DIAGNOSIS — F259 Schizoaffective disorder, unspecified: Secondary | ICD-10-CM | POA: Insufficient documentation

## 2012-12-13 DIAGNOSIS — R1110 Vomiting, unspecified: Secondary | ICD-10-CM | POA: Insufficient documentation

## 2012-12-13 DIAGNOSIS — F329 Major depressive disorder, single episode, unspecified: Secondary | ICD-10-CM | POA: Insufficient documentation

## 2012-12-13 DIAGNOSIS — F3289 Other specified depressive episodes: Secondary | ICD-10-CM | POA: Insufficient documentation

## 2012-12-13 DIAGNOSIS — Z79899 Other long term (current) drug therapy: Secondary | ICD-10-CM | POA: Insufficient documentation

## 2012-12-13 DIAGNOSIS — E860 Dehydration: Secondary | ICD-10-CM | POA: Insufficient documentation

## 2012-12-13 DIAGNOSIS — R51 Headache: Secondary | ICD-10-CM | POA: Insufficient documentation

## 2012-12-13 DIAGNOSIS — R531 Weakness: Secondary | ICD-10-CM

## 2012-12-13 DIAGNOSIS — F411 Generalized anxiety disorder: Secondary | ICD-10-CM | POA: Insufficient documentation

## 2012-12-13 DIAGNOSIS — E785 Hyperlipidemia, unspecified: Secondary | ICD-10-CM | POA: Insufficient documentation

## 2012-12-13 DIAGNOSIS — R197 Diarrhea, unspecified: Secondary | ICD-10-CM

## 2012-12-13 DIAGNOSIS — R5381 Other malaise: Secondary | ICD-10-CM | POA: Insufficient documentation

## 2012-12-13 DIAGNOSIS — G40909 Epilepsy, unspecified, not intractable, without status epilepticus: Secondary | ICD-10-CM | POA: Insufficient documentation

## 2012-12-13 DIAGNOSIS — Z7982 Long term (current) use of aspirin: Secondary | ICD-10-CM | POA: Insufficient documentation

## 2012-12-13 DIAGNOSIS — I10 Essential (primary) hypertension: Secondary | ICD-10-CM | POA: Insufficient documentation

## 2012-12-13 LAB — URINALYSIS, ROUTINE W REFLEX MICROSCOPIC
Bilirubin Urine: NEGATIVE
Glucose, UA: NEGATIVE mg/dL
Hgb urine dipstick: NEGATIVE
Leukocytes, UA: NEGATIVE
Nitrite: NEGATIVE
Protein, ur: NEGATIVE mg/dL
Specific Gravity, Urine: 1.007 (ref 1.005–1.030)
Urobilinogen, UA: 0.2 mg/dL (ref 0.0–1.0)
pH: 7 (ref 5.0–8.0)

## 2012-12-13 LAB — CBC
HCT: 33.5 % — ABNORMAL LOW (ref 39.0–52.0)
Hemoglobin: 12 g/dL — ABNORMAL LOW (ref 13.0–17.0)
MCH: 32.3 pg (ref 26.0–34.0)
MCHC: 35.8 g/dL (ref 30.0–36.0)
MCV: 90.1 fL (ref 78.0–100.0)
Platelets: 334 10*3/uL (ref 150–400)
RBC: 3.72 MIL/uL — ABNORMAL LOW (ref 4.22–5.81)
RDW: 14.2 % (ref 11.5–15.5)
WBC: 10.5 10*3/uL (ref 4.0–10.5)

## 2012-12-13 LAB — COMPREHENSIVE METABOLIC PANEL
ALT: 37 U/L (ref 0–53)
AST: 29 U/L (ref 0–37)
Albumin: 2.8 g/dL — ABNORMAL LOW (ref 3.5–5.2)
Alkaline Phosphatase: 79 U/L (ref 39–117)
BUN: 6 mg/dL (ref 6–23)
CO2: 25 mEq/L (ref 19–32)
Calcium: 9.3 mg/dL (ref 8.4–10.5)
Chloride: 100 mEq/L (ref 96–112)
Creatinine, Ser: 0.68 mg/dL (ref 0.50–1.35)
GFR calc Af Amer: >90 mL/min (ref >90)
GFR calc non Af Amer: >90 mL/min (ref >90)
Glucose, Bld: 100 mg/dL — ABNORMAL HIGH (ref 70–99)
Potassium: 3.2 mEq/L — ABNORMAL LOW (ref 3.5–5.1)
Sodium: 135 mEq/L (ref 135–145)
Total Bilirubin: 0.2 mg/dL — ABNORMAL LOW (ref 0.3–1.2)
Total Protein: 6 g/dL (ref 6.0–8.3)

## 2012-12-13 LAB — RAPID URINE DRUG SCREEN, HOSP PERFORMED
Amphetamines: NOT DETECTED
Barbiturates: NOT DETECTED
Benzodiazepines: POSITIVE — AB
Cocaine: NOT DETECTED
Opiates: NOT DETECTED
Tetrahydrocannabinol: NOT DETECTED

## 2012-12-13 LAB — CULTURE, BLOOD (ROUTINE X 2)
Culture: NO GROWTH
Culture: NO GROWTH

## 2012-12-13 LAB — ACETAMINOPHEN LEVEL: Acetaminophen (Tylenol), Serum: <15 ug/mL (ref 10–30)

## 2012-12-13 LAB — SALICYLATE LEVEL: Salicylate Lvl: <2 mg/dL — ABNORMAL LOW (ref 2.8–20.0)

## 2012-12-13 LAB — PHENYTOIN LEVEL, TOTAL: Phenytoin Lvl: 10 ug/mL (ref 10.0–20.0)

## 2012-12-13 MED ORDER — ALPRAZOLAM 0.5 MG PO TABS
0.5000 mg | ORAL_TABLET | Freq: Once | ORAL | Status: AC
  Administered 2012-12-13: 0.5 mg via ORAL
  Filled 2012-12-13: qty 1

## 2012-12-13 MED ORDER — POTASSIUM CHLORIDE CRYS ER 20 MEQ PO TBCR
40.0000 meq | EXTENDED_RELEASE_TABLET | Freq: Once | ORAL | Status: AC
  Administered 2012-12-13: 40 meq via ORAL
  Filled 2012-12-13: qty 2

## 2012-12-13 MED ORDER — HYDROCODONE-ACETAMINOPHEN 5-325 MG PO TABS
1.0000 | ORAL_TABLET | Freq: Once | ORAL | Status: AC
  Administered 2012-12-13: 1 via ORAL
  Filled 2012-12-13: qty 1

## 2012-12-13 MED ORDER — SODIUM CHLORIDE 0.9 % IV BOLUS (SEPSIS)
1000.0000 mL | Freq: Once | INTRAVENOUS | Status: AC
  Administered 2012-12-13: 1000 mL via INTRAVENOUS

## 2012-12-13 NOTE — ED Notes (Signed)
Pt states that he was in intensive care a week ago at [facility].  States that he does not remember why he was in the hospital because he was so sick. States that yesterday he began feeling bad yesterday with "pounding headache" and weakness.  Pt is A & O x 4 but it very pale.

## 2012-12-13 NOTE — ED Provider Notes (Signed)
History     CSN: [id number]  Arrival date & time 12/13/12  0718   First MD Initiated Contact with Patient 12/13/12 [phone]      Chief Complaint  Patient presents with  . Headache  . Weakness    (Consider location/radiation/quality/duration/timing/severity/associated sxs/prior treatment) Patient is a 56 y.o. male presenting with headaches and weakness. The history is provided by the patient.  Headache  Pertinent negatives include no fever and no shortness of breath.  Weakness Primary symptoms do not include fever.  pt difficult historian, hx schizoaffective disorder, states was d/cd from hospital last week w dehydration and diarrheal illness. States no definite dx made. Since hospital discharge states symptoms improved, but not resolved. Has had a couple episodes emesis, not bloody or bilious, none today, and that diarrhea persists w a few episodes per day, not bloody. No abd pain. States has eaten/drank relatively little as he had intended to go to grocery store, but was feeling too weak to go. Denies fever or chills. States last night he was very worried as bp was high, and had mild dull headache - states he didn't want to have a stroke, so he checked his blood pressure every 15-30 minutes, and 'titrated' his toprol medication, taking a couple extra doses. Denies taking any other of his medication in over the prescribed dose. No current headache. Denies fever or chills. No cough or uri c/o. No gu c/o.      Past Medical History  Diagnosis Date  . Seizures   . Hypertension   . GERD (gastroesophageal reflux disease)   . Dyslipidemia   . Schizoaffective disorder   . Depression   . Anxiety   . MRSA carrier     Past Surgical History  Procedure Date  . Self orchectomy   . Colonoscopy 2010  . Orif shoulder fracture     Family History  Problem Relation Age of Onset  . Stroke Father     Living at 37  . Stroke Mother     Stroke in late 39's. Died in her 26's    History    Substance Use Topics  . Smoking status: Never Smoker   . Smokeless tobacco: Never Used  . Alcohol Use: No      Review of Systems  Constitutional: Negative for fever and chills.  HENT: Negative for sore throat and neck pain.   Eyes: Negative for redness.  Respiratory: Negative for shortness of breath.   Cardiovascular: Negative for chest pain.  Gastrointestinal: Negative for abdominal pain.  Genitourinary: Negative for dysuria and flank pain.  Musculoskeletal: Negative for back pain.  Skin: Negative for rash.  Neurological: Negative for numbness.  Hematological: Does not bruise/bleed easily.  Psychiatric/Behavioral:       Anxiety    Allergies  Sulfa antibiotics  Home Medications   Current Outpatient Rx  Name  Route  Sig  Dispense  Refill  . ALPRAZOLAM 1 MG PO TABS   Oral   Take 1 mg by mouth 3 (three) times daily.          . ASPIRIN EC 81 MG PO TBEC   Oral   Take 81 mg by mouth at bedtime.          Marland Kitchen CIPROFLOXACIN HCL 500 MG PO TABS   Oral   Take 1 tablet (500 mg total) by mouth 2 (two) times daily.   6 tablet   0   . CLONAZEPAM 2 MG PO TABS   Oral   Take 5  mg by mouth 2 (two) times daily.         Marland Kitchen DOXEPIN HCL 150 MG PO CAPS   Oral   Take 900 mg by mouth at bedtime.         Marland Kitchen METOPROLOL TARTRATE 50 MG PO TABS   Oral   Take 1 tablet (50 mg total) by mouth 2 (two) times daily.   180 tablet   3   . METRONIDAZOLE 500 MG PO TABS   Oral   Take 1 tablet (500 mg total) by mouth every 12 (twelve) hours.   6 tablet   0   . ADULT MULTIVITAMIN W/MINERALS CH   Oral   Take 1 tablet by mouth daily.         Marland Kitchen OMEPRAZOLE 20 MG PO CPDR      TAKE 1 CAPSULE EVERY DAY   31 capsule   8   . PHENYTOIN 50 MG PO CHEW   Oral   Chew 200 mg by mouth at bedtime.          Marland Kitchen POLYETHYLENE GLYCOL 3350 PO PACK   Oral   Take 17 g by mouth daily as needed. For constipation. Mix into 8 ounces of fluid & drink         . QUETIAPINE FUMARATE 300 MG PO TABS    Oral   Take 900 mg by mouth at bedtime.          Marland Kitchen SIMVASTATIN 40 MG PO TABS   Oral   Take 40 mg by mouth at bedtime.         . TESTOSTERONE CYPIONATE 100 MG/ML IM OIL   Intramuscular   Inject 4 mLs (400 mg total) into the muscle every 14 (fourteen) days.   10 mL   5     BP 135/67  Pulse 85  Temp 98.6 F (37 C) (Oral)  Resp 20  SpO2 100%  Physical Exam  Nursing note and vitals reviewed. Constitutional: He is oriented to person, place, and time. He appears well-developed and well-nourished. No distress.  HENT:  Head: Atraumatic.  Nose: Nose normal.  Mouth/Throat: Oropharynx is clear and moist.       No sinus or temporal tenderness  Eyes: Conjunctivae normal and EOM are normal. Pupils are equal, round, and reactive to light. No scleral icterus.  Neck: Normal range of motion. Neck supple. No tracheal deviation present.       No stiffness or rigidity  Cardiovascular: Normal rate, regular rhythm, normal heart sounds and intact distal pulses.   Pulmonary/Chest: Effort normal and breath sounds normal. No accessory muscle usage. No respiratory distress.  Abdominal: Soft. Bowel sounds are normal. He exhibits no distension and no mass. There is no tenderness. There is no rebound and no guarding.  Genitourinary:       No cva tenderness  Musculoskeletal: Normal range of motion. He exhibits no edema and no tenderness.  Neurological: He is alert and oriented to person, place, and time. No cranial nerve deficit.       Motor intact bil.  Skin: Skin is warm and dry.  Psychiatric:       Mildly anxious. Pt drifts tangentially from one thought process to next, rather randomly describing one symptom, then another.    ED Course  Procedures (including critical care time)   Labs Reviewed  URINALYSIS, ROUTINE W REFLEX MICROSCOPIC  CBC  COMPREHENSIVE METABOLIC PANEL  CLOSTRIDIUM DIFFICILE BY PCR  URINE RAPID DRUG SCREEN (HOSP PERFORMED)  ACETAMINOPHEN LEVEL  SALICYLATE LEVEL    PHENYTOIN LEVEL, TOTAL   Results for orders placed during the hospital encounter of 12/13/12  URINALYSIS, ROUTINE W REFLEX MICROSCOPIC      Component Value Range   Color, Urine YELLOW  YELLOW   APPearance CLEAR  CLEAR   Specific Gravity, Urine 1.007  1.005 - 1.030   pH 7.0  5.0 - 8.0   Glucose, UA NEGATIVE  NEGATIVE mg/dL   Hgb urine dipstick NEGATIVE  NEGATIVE   Bilirubin Urine NEGATIVE  NEGATIVE   Ketones, ur TRACE (*) NEGATIVE mg/dL   Protein, ur NEGATIVE  NEGATIVE mg/dL   Urobilinogen, UA 0.2  0.0 - 1.0 mg/dL   Nitrite NEGATIVE  NEGATIVE   Leukocytes, UA NEGATIVE  NEGATIVE  CBC      Component Value Range   WBC 10.5  4.0 - 10.5 K/uL   RBC 3.72 (*) 4.22 - 5.81 MIL/uL   Hemoglobin 12.0 (*) 13.0 - 17.0 g/dL   HCT 57.8 (*) 46.9 - 62.9 %   MCV 90.1  78.0 - 100.0 fL   MCH 32.3  26.0 - 34.0 pg   MCHC 35.8  30.0 - 36.0 g/dL   RDW 52.8  41.3 - 24.4 %   Platelets 334  150 - 400 K/uL  COMPREHENSIVE METABOLIC PANEL      Component Value Range   Sodium 135  135 - 145 mEq/L   Potassium 3.2 (*) 3.5 - 5.1 mEq/L   Chloride 100  96 - 112 mEq/L   CO2 25  19 - 32 mEq/L   Glucose, Bld 100 (*) 70 - 99 mg/dL   BUN 6  6 - 23 mg/dL   Creatinine, Ser 0.10  0.50 - 1.35 mg/dL   Calcium 9.3  8.4 - 27.2 mg/dL   Total Protein 6.0  6.0 - 8.3 g/dL   Albumin 2.8 (*) 3.5 - 5.2 g/dL   AST 29  0 - 37 U/L   ALT 37  0 - 53 U/L   Alkaline Phosphatase 79  39 - 117 U/L   Total Bilirubin 0.2 (*) 0.3 - 1.2 mg/dL   GFR calc non Af Amer >90  >90 mL/min   GFR calc Af Amer >90  >90 mL/min  URINE RAPID DRUG SCREEN (HOSP PERFORMED)      Component Value Range   Opiates NONE DETECTED  NONE DETECTED   Cocaine NONE DETECTED  NONE DETECTED   Benzodiazepines POSITIVE (*) NONE DETECTED   Amphetamines NONE DETECTED  NONE DETECTED   Tetrahydrocannabinol NONE DETECTED  NONE DETECTED   Barbiturates NONE DETECTED  NONE DETECTED  ACETAMINOPHEN LEVEL      Component Value Range   Acetaminophen (Tylenol), Serum <15.0  10  - 30 ug/mL  SALICYLATE LEVEL      Component Value Range   Salicylate Lvl <2.0 (*) 2.8 - 20.0 mg/dL  PHENYTOIN LEVEL, TOTAL      Component Value Range   Phenytoin Lvl 10.0  10.0 - 20.0 ug/mL     MDM  Iv ns bolus.  Labs.  Reviewed nursing notes and prior charts for additional history.    Additional ns iv bolus in ed.  Recheck abd soft nt.   Given meal tray and fluids. No nv.   No episode diarrhea during period of observation in the ED.  Pt does appear anxious, also request pain rx for body ache.  Xanax po, vicodin 1 po.  Pt comfortable. abd soft nt. Requests d/c. No diarrhea episodes during approximately 5 hours of  time in ED.         Suzi Roots, MD 12/13/12 [phone]

## 2012-12-13 NOTE — ED Notes (Signed)
Pt came to nursing station dressed, stating "i'm leaving, I haven't slept in 38 hours and I need to go home and sleep". Pt escorted back to room, explaining he has to get his IV taken out, EDP made aware.

## 2012-12-13 NOTE — ED Notes (Signed)
Ginger ale provided to the patient.  

## 2012-12-13 NOTE — ED Notes (Signed)
Pt states he was in the hospital a week ago for diarrhea and weakness, states he is here today for weakness and "something is wrong". Pt states been having diarrhea, vomiting, high blood pressure and headaches. Pt denies headache at this time. Pt denies tenderness in abdomen. Pt states he took extra tegretol to get his blood pressure down, states his blood pressure was 172/122. Pt having some confusion when talking.

## 2012-12-13 NOTE — ED Notes (Signed)
Pt aware of the need for a urine sample, pt unable to void at this time. Urinal at bedside. 

## 2012-12-13 NOTE — ED Notes (Signed)
Pt instructed he should not drive home d/t medication given earlier, pt states "I'm leaving now and I can drive". Pt left and refused to call someone to come get him.

## 2012-12-13 NOTE — ED Notes (Signed)
Pt states that he is paranoid that he is going to have a stroke or a seizure. States that he took more of his tegratol than he is supposed to to get his blood pressure down.  Pt is not hypertensive.

## 2012-12-13 NOTE — ED Notes (Addendum)
Pt states he took too many toprolol, states "I was titrating my blood pressure".

## 2012-12-15 ENCOUNTER — Ambulatory Visit: Payer: Medicaid Other | Admitting: Family Medicine

## 2012-12-15 ENCOUNTER — Encounter: Payer: Self-pay | Admitting: Family Medicine

## 2012-12-15 ENCOUNTER — Ambulatory Visit (INDEPENDENT_AMBULATORY_CARE_PROVIDER_SITE_OTHER): Payer: Medicaid Other | Admitting: Family Medicine

## 2012-12-15 VITALS — BP 107/68 | HR 92 | Temp 98.0°F | Ht 71.0 in | Wt 182.7 lb

## 2012-12-15 DIAGNOSIS — I10 Essential (primary) hypertension: Secondary | ICD-10-CM

## 2012-12-15 DIAGNOSIS — R197 Diarrhea, unspecified: Secondary | ICD-10-CM

## 2012-12-15 DIAGNOSIS — E876 Hypokalemia: Secondary | ICD-10-CM

## 2012-12-15 MED ORDER — POTASSIUM CHLORIDE CRYS ER 20 MEQ PO TBCR: 20.0000 meq | EXTENDED_RELEASE_TABLET | Freq: Every day | ORAL | Status: DC

## 2012-12-15 MED ORDER — HYDROCHLOROTHIAZIDE 25 MG PO TABS: 25.0000 mg | ORAL_TABLET | Freq: Every day | ORAL | Status: DC

## 2012-12-15 NOTE — Assessment & Plan Note (Signed)
Improving.  As patient himself admits, anticholinergic side effects to high doses of medications that he is on should help alleviate these symptoms.   No medication or testing needed except for potassium repletement.  FU in 1 week for BMET to recheck potassium.

## 2012-12-15 NOTE — Patient Instructions (Addendum)
We will switch you to the Hydrochlorothiazide.  It is called HCTZ for short.  Stop taking the Toprol.    Make an appointment for Pharmacy clinic for home blood pressure monitoring.    Also make a lab appointment to check your potassium later this week or early next week.    It was good to meet you, have a Happy New Year!

## 2012-12-15 NOTE — Assessment & Plan Note (Signed)
Patient states that he takes one blood pressure medication for several years before it "stops working." Has been on Toprol for about 15 years.  Would like to switch to another medication. Told him to hold his medications for today - can switch to HCTZ tomorrow. Currently, he has had not concerning symptoms for hypotension.  He has a large amount of beta blocker in his system but this should be working its way out.  Red flags regarding hypotension provided.  Recommended also that he be evaluated in Pharmacy clinic for home ambulatory monitoring.  He has had BP's basically all WNL here but states that it is elevated at home.  Would like to further evaluate this.

## 2012-12-15 NOTE — Progress Notes (Signed)
Patient ID: [name], male   DOB: 14-Nov-1956, 56 y.o.   MRN: [id number] [name] is a 56 y.o. male who presents to [facility] today for concerns over his blood pressure:  1.  HTN:  Patient states he was at home 2 days ago when he noted that he had a headache. He took his blood pressure and found it to be 180/110. He took an extra dose metoprolol. However his blood pressure did not decrease and he continued taking his metoprolol and then checking his blood pressure throughout the night. By 5 AM he had taken about 1 g of Toprol. Since then his blood pressure has been doing much better. He has had no further headaches. He denies any chest pains or shortness of breath.  No lightheaded symptoms or falls.   Of note, schizophrenia noted in his history. I was recently the attending in the hospital he was admitted for diarrhea and there is concern for serotonin syndrome. However this turned out to be negative. Please see below for his diarrhea.  2.  Diarrhea:  Persists but is greatly improving. Only has 1-2 loose bowel movements a day. Was having 5-6 prior to going into the hospital. His weakness is slowly improving as well. He has no abdominal pain. No melena. No hematochezia. No nausea vomiting. No fevers or chills.   The following portions of the patient's history were reviewed and updated as appropriate: allergies, current medications, past medical history, family and social history, and problem list.  Patient is a nonsmoker.  Past Medical History  Diagnosis Date  . Seizures   . Hypertension   . GERD (gastroesophageal reflux disease)   . Dyslipidemia   . Schizoaffective disorder   . Depression   . Anxiety   . MRSA carrier     ROS as above otherwise neg. No Chest pain, palpitations, SOB, Fever, Chills, Abd pain, N/V/D.  Medications reviewed. Current Outpatient Prescriptions  Medication Sig Dispense Refill  . ALPRAZolam (XANAX) 1 MG tablet Take 1 mg by mouth 3 (three) times daily.       Marland Kitchen  aspirin EC 81 MG tablet Take 81 mg by mouth at bedtime.       . ciprofloxacin (CIPRO) 500 MG tablet Take 500 mg by mouth 2 (two) times daily. He was to take for 3 days. This was filled on 12/09/12. He has not completed the regimen.      . clonazePAM (KLONOPIN) 2 MG tablet Take 5 mg by mouth 2 (two) times daily.      Marland Kitchen doxepin (SINEQUAN) 150 MG capsule Take 900 mg by mouth at bedtime.      . metoprolol (LOPRESSOR) 50 MG tablet Take 1 tablet (50 mg total) by mouth 2 (two) times daily.  180 tablet  3  . metroNIDAZOLE (FLAGYL) 500 MG tablet Take 500 mg by mouth every 12 (twelve) hours. He was to take for 3 days. This was filled on 12/09/12. He has not completed the regimen.      . Multiple Vitamin (MULITIVITAMIN WITH MINERALS) TABS Take 1 tablet by mouth at bedtime.       Marland Kitchen omeprazole (PRILOSEC) 20 MG capsule Take 20 mg by mouth daily as needed. For heartburn or acid reflux.      . phenytoin (DILANTIN) 50 MG tablet Chew 200 mg by mouth at bedtime.       . polyethylene glycol (MIRALAX / GLYCOLAX) packet Take 17 g by mouth daily as needed. For constipation. Mix into 8 ounces of fluid &  drink      . QUEtiapine (SEROQUEL) 300 MG tablet Take 900 mg by mouth at bedtime.       . simvastatin (ZOCOR) 40 MG tablet Take 40 mg by mouth at bedtime.      Marland Kitchen testosterone cypionate (DEPOTESTOTERONE CYPIONATE) 100 MG/ML injection Inject 400 mg into the muscle every 14 (fourteen) days.        Exam:  BP 107/68  Pulse 92  Temp 98 F (36.7 C) (Oral)  Ht 5\' 11"  (1.803 m)  Wt 182 lb 11.2 oz (82.872 kg)  BMI 25.48 kg/m2 Gen: Well NAD HEENT: EOMI,  MMM Pulm:  Clear to auscultation bilaterally with good air movement.  No wheezes or rales noted.   Cardiac:  Regular rate and rhythm without murmur auscultated.  Good S1/S2. Abd: NABS, NT, ND Exts: Non edematous BL  LE, warm and well perfused.  Neuro:  Alert and oriented.  No focal deficits noted.  Psych:  Linear and coherent thought process, pleasant, no delusions  currently.

## 2012-12-21 ENCOUNTER — Other Ambulatory Visit: Payer: Self-pay | Admitting: Family Medicine

## 2012-12-22 ENCOUNTER — Telehealth: Payer: Self-pay | Admitting: *Deleted

## 2012-12-22 NOTE — Telephone Encounter (Addendum)
Late Entry;   Patient called on 12/19/2012 stating his heart rate is 133. Has checked it several times .  Ask him to recheck manually and explained how to do this. Rate 120. Took HCTZ this AM.  Consulted with Dr. Gwendolyn Grant and he advises patient can restart metoprolol and stop HCTZ. Also advised to stop potassium. Patient is agreeable to this .

## 2012-12-24 ENCOUNTER — Inpatient Hospital Stay: Payer: Medicaid Other | Admitting: Emergency Medicine

## 2012-12-25 ENCOUNTER — Ambulatory Visit (INDEPENDENT_AMBULATORY_CARE_PROVIDER_SITE_OTHER): Payer: Medicaid Other | Admitting: Pharmacist

## 2012-12-25 ENCOUNTER — Encounter: Payer: Self-pay | Admitting: Pharmacist

## 2012-12-25 ENCOUNTER — Other Ambulatory Visit: Payer: Medicaid Other

## 2012-12-25 VITALS — BP 120/89 | HR 96 | Ht 72.5 in | Wt 180.0 lb

## 2012-12-25 DIAGNOSIS — E291 Testicular hypofunction: Secondary | ICD-10-CM

## 2012-12-25 DIAGNOSIS — I10 Essential (primary) hypertension: Secondary | ICD-10-CM

## 2012-12-25 DIAGNOSIS — F332 Major depressive disorder, recurrent severe without psychotic features: Secondary | ICD-10-CM

## 2012-12-25 DIAGNOSIS — E876 Hypokalemia: Secondary | ICD-10-CM

## 2012-12-25 LAB — BASIC METABOLIC PANEL
BUN: 11 mg/dL (ref 6–23)
CO2: 27 mEq/L (ref 19–32)
Calcium: 9.4 mg/dL (ref 8.4–10.5)
Chloride: 103 mEq/L (ref 96–112)
Creat: 0.89 mg/dL (ref 0.50–1.35)
Glucose, Bld: 78 mg/dL (ref 70–99)
Potassium: 4.1 mEq/L (ref 3.5–5.3)
Sodium: 138 mEq/L (ref 135–145)

## 2012-12-25 MED ORDER — METOPROLOL SUCCINATE ER 100 MG PO TB24: 100.0000 mg | ORAL_TABLET | Freq: Every evening | ORAL | Status: DC

## 2012-12-25 NOTE — Patient Instructions (Addendum)
Continue to monitor your blood pressure at home. Stop taking the metoprolol tartrate twice daily.   We will prescribe metroprolol succinate (metoprolol XR) for you to take once daily in the evening with your other medicines. We will wait to see how your blood pressure does at home on the new medicine before doing the 24 hour blood pressure monitoring.  Follow up with Dr. Elwyn Reach

## 2012-12-25 NOTE — Assessment & Plan Note (Signed)
HTN: History of HTN currently controlled with erratic medication compliance with BID metoprolol.  Switch to metoprolol succinate (XL) to allow for once daily dosing - 100 mg daily. Follow up after adequate trial to re-evaluate need for ambulatory blood pressure monitoring.  Pt verbalized understanding. Written instructions provided. Time spent face to face counseling: 40 minutes. Patient seen with Doris Cheadle PharmD, Pharmacy Resident.

## 2012-12-25 NOTE — Progress Notes (Signed)
BMP DONE TODAY [name] 

## 2012-12-25 NOTE — Progress Notes (Addendum)
  Subjective:    Patient ID: [name], male    DOB: 07-15-1956, 57 y.o.   MRN: [id number]  HPI Pt arrives to clinic disheveled. He has a long-standing history of severe depression and reports not getting out of bed or leaving the house 5-6 days/week. He is currently on doxepin 900 mg qHS and Seroquel 900 mg qHS. He reports having tried SSRIs in the past with severe paradoxical side effects. This led to him starting a TCA with antiepileptics. He reports it has been 3-4 years since last seizure.  He is concerned about his medication doses not being adequate, and it has been ~4 months since he last saw behavioral health. Acknowledges he needs to go for a visit.    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 Admits he jumps to worst-case scenario in most situations. Reports social issues with his older brother who previously lived in [place] and now lives in [place].  Concerned his brother is going to harm him in order to get his trust that his parents, who passed away in the last year, had left him. Also reports concern his attorneys are trying to get the trust for his nephews (brother's children).  No other siblings. Denies any other social support.    He reports high blood pressure when laying down at home. Highest BP he has seen is 180/125. Reports it is normal when sitting. He previously tried HCTZ alone, but he became tachycardic. Was then switched back to metoprolol. He is currently on metoprolol tartrate 50 mg BID. States he takes all of his medications at bedtime, but it is hard for him to remember to take his morning metoprolol. He forgets his morning dose about 3x/week. He brought his home BP monitor today. Reading was very similar to clinic monitor.     Review of Systems     Objective:   Physical Exam        Assessment & Plan:  HTN: History of HTN currently controlled with erratic medication compliance with  BID metoprolol.  Switch to metoprolol succinate (XL) to allow for once daily dosing - 100 mg daily. Follow up after adequate trial to re-evaluate need for ambulatory blood pressure monitoring.  Pt verbalized understanding. Written instructions provided. Time spent face to face counseling: 40 minutes. Patient seen with Doris Cheadle PharmD, Pharmacy Resident.  Depression: Long-standing severe depression currently on doxepin 900 mg qHS, Seroquel 900 mg qHS, Xanax 1 mg TID, and clonazepam 3 mg BID. Despite this regimen, pt continues to have difficulty sleeping.  Pt stays in bed all day 5-6 days per week. Referred back to mental health for additional management as he has not been seen in 4 months. Pt aware of potential to switch agents since new agents are available.  Follow up with [name].     Patient called back < 30 minutes after visit.  He was wondering about why the pharmacy resident and student left the room to discuss therapy.  He was reassured that this was our standard approach.   Patient also wanted additional clarification on treatment options for depression provided.   I discussed with him the need to follow-up with the providers at [name] as they remain the sole providers of his mental health therapy decisions as he is so complicated.   He verbalized understanding of these issues.

## 2012-12-25 NOTE — Assessment & Plan Note (Signed)
Pt been on depot testosterone for years with no follow up lab values since 2011. Needs reassessment for adequacy of dose given vegetative symptomatology staying in bed 5/7 days and low energy level and strength. Plan repeat testosterone level at end of 2 week dosing interval. Nonadherence for >3 weeks makes evaluation today inappropriate. Pt to readminister testosterone in near future.

## 2012-12-25 NOTE — Assessment & Plan Note (Signed)
Depression: Long-standing severe depression currently on doxepin 900 mg qHS, Seroquel 900 mg qHS, Xanax 1 mg TID, and clonazepam 3 mg BID. Despite this regimen, pt continues to have difficulty sleeping.  Pt stays in bed all day 5-6 days per week. Referred back to mental health for additional management as he has not been seen in 4 months. Pt aware of potential to switch agents since new agents are available.  Follow up with [name].

## 2012-12-26 ENCOUNTER — Encounter: Payer: Self-pay | Admitting: Family Medicine

## 2012-12-26 NOTE — Progress Notes (Signed)
Patient ID: [name], male   DOB: 1956-02-11, 57 y.o.   MRN: [id number] Reviewed: Agree with Dr. Macky Lower documentation and management.

## 2013-01-21 ENCOUNTER — Telehealth: Payer: Self-pay | Admitting: Emergency Medicine

## 2013-01-21 NOTE — Telephone Encounter (Signed)
Patient would like to speak to the nurse because he is concerned about his elevated CP's.  He thinks he is getting to the point where it is dangerous.

## 2013-01-21 NOTE — Telephone Encounter (Signed)
Returned call to patient.  Patient wants to know if his BP is "getting close to dangerously high."  BP was 156/?---patient does not remember what his bottom number was.  Has a BP monitor at home.  States he was asleep.  Patient will recheck his BP later today and call us back with the reading.  Gaylene Brooks, RN

## 2013-01-22 NOTE — Telephone Encounter (Signed)
Patient has "history of orthostatic HTN" and Toprol dose was decreased.  Patient concerned about his BP, but has not checked it since yesterday.  Patient did not remember his diastolic reading.  Per last office note--patient is overdue to come in for F/U appt.  Appt scheduled for 02/05/13 with Dr. Elwyn Reach at 1:45 pm.  Patient will call back if he has any concerns before his appt.

## 2013-01-27 ENCOUNTER — Telehealth: Payer: Self-pay | Admitting: Sports Medicine

## 2013-01-27 NOTE — Telephone Encounter (Signed)
After hours emergency line:  Patient called reporting acute onset shortness of breath.  Reports that it has occurred over the past 3 hours.  No underlying lung issues but reports having had similar episodes previously.  Reports rapid shallow breathing but not dizziness, orthostasis, or overt dyspnea, just rapid breathing.  Issues with BP meds in the past and reports his most recent BP was above his normal.    Upon further chart review noted to have schizophrenia noted and multiple emergency line calls due to BP issues.  Does not want to be seen in ED or UC but I encouraged him that he needs to be evaluated if he is significantly short of breath.  Explained that I cannot diagnosis his breathing difficulties  over the phone and encouraged him to be evaluated tonight.  If he felt he was well enough to wait until morning he should call to be seen in the [facility] as a work in. However encouraged to be evaluated tonight due to noticeable tachypnea on the phone.    Pt voiced understanding but declined being seen  Andrena Mews, DO [facility] Resident - PGY-2 01/27/2013 5:58 PM

## 2013-02-05 ENCOUNTER — Encounter: Payer: Self-pay | Admitting: Emergency Medicine

## 2013-02-05 ENCOUNTER — Ambulatory Visit (INDEPENDENT_AMBULATORY_CARE_PROVIDER_SITE_OTHER): Payer: Medicaid Other | Admitting: Emergency Medicine

## 2013-02-05 VITALS — BP 156/89 | HR 119 | Ht 71.0 in | Wt 180.8 lb

## 2013-02-05 DIAGNOSIS — R000 Tachycardia, unspecified: Secondary | ICD-10-CM | POA: Insufficient documentation

## 2013-02-05 DIAGNOSIS — Z9079 Acquired absence of other genital organ(s): Secondary | ICD-10-CM

## 2013-02-05 DIAGNOSIS — Z79899 Other long term (current) drug therapy: Secondary | ICD-10-CM

## 2013-02-05 DIAGNOSIS — E291 Testicular hypofunction: Secondary | ICD-10-CM

## 2013-02-05 DIAGNOSIS — Z5181 Encounter for therapeutic drug level monitoring: Secondary | ICD-10-CM

## 2013-02-05 DIAGNOSIS — I10 Essential (primary) hypertension: Secondary | ICD-10-CM

## 2013-02-05 DIAGNOSIS — F332 Major depressive disorder, recurrent severe without psychotic features: Secondary | ICD-10-CM

## 2013-02-05 DIAGNOSIS — Z79890 Hormone replacement therapy (postmenopausal): Secondary | ICD-10-CM

## 2013-02-05 LAB — TESTOSTERONE: Testosterone: 64 ng/dL — ABNORMAL LOW (ref 300–890)

## 2013-02-05 MED ORDER — LISINOPRIL 5 MG PO TABS: 5.0000 mg | ORAL_TABLET | Freq: Every day | ORAL | Status: DC

## 2013-02-05 NOTE — Progress Notes (Signed)
  Subjective:    Patient ID: [name], male    DOB: [birth year], 57 y.o.   MRN: [id number]  HPI [name] is here for f/u of HTN.  Hypertension Well controlled: no Compliant with medication: yes - remembers to take medications at night, but has difficulty in BID dosing Side effects from medication: no Check BP at home: yes  BP ranges: states 20 points higher in the evenings  Chest pain: no Palpitations: no Vision changes: no Leg edema: no Dizziness: no  Depression Reports decreased energy levels and fatigue.  Has f/u with psychiatrist/therapist tomorrow.  Androgen deficiency Self injections of testosterone every 2 weeks.  He reports that he does not always remember to give himself the injections.  Thinks he is due soon.  I have reviewed and updated the following as appropriate: allergies, current medications, past family history, past medical history, past social history and past surgical history SHx: never smoker  Review of Systems See HPI    Objective:   Physical Exam BP 156/89  Pulse 119  Ht 5\' 11"  (1.803 m)  Wt 180 lb 12.8 oz (82.01 kg)  BMI 25.23 kg/m2 Gen: alert, cooperative, NAD, well groomed and clean today HEENT: AT/[place], sclera white, MMM Neck: supple CV: RRR, no murmurs Pulm: CTAB, no wheezes or rales Ext: no edema     Assessment & Plan:

## 2013-02-05 NOTE — Assessment & Plan Note (Addendum)
Uncontrolled today.  Will start lisinopril 5mg  daily given concurrent systolic dysfunction.  Follow up in 2 weeks for recheck and BMP.  Will make changes slowly given history of orthostatic hypotension.

## 2013-02-05 NOTE — Assessment & Plan Note (Signed)
Will check testosterone level and PSA today.

## 2013-02-05 NOTE — Assessment & Plan Note (Signed)
Chronic issue.  Improved with metoprolol.  Would like to increase metoprolol XL to 150 mg daily, but wary of making too many changes at once.  Reviewed EKG from 11/2012 which shows sinus tachy.  Will f/u in 2 weeks and can consider increasing BB then.

## 2013-02-05 NOTE — Patient Instructions (Signed)
It was nice to see you! We are going to do one thing at a time. Please start taking lisinopril 5mg  daily. Follow up in 2 weeks to check your blood pressure and pulse again. We will make additional changes from there.

## 2013-02-05 NOTE — Assessment & Plan Note (Signed)
Has appt with [name] tomorrow.

## 2013-02-06 LAB — PSA: PSA: 0.72 ng/mL (ref <=4.00)

## 2013-02-14 ENCOUNTER — Other Ambulatory Visit: Payer: Self-pay | Admitting: Family Medicine

## 2013-02-26 ENCOUNTER — Encounter: Payer: Self-pay | Admitting: Emergency Medicine

## 2013-02-26 ENCOUNTER — Ambulatory Visit (INDEPENDENT_AMBULATORY_CARE_PROVIDER_SITE_OTHER): Payer: Medicaid Other | Admitting: Emergency Medicine

## 2013-02-26 VITALS — BP 125/107 | HR 125 | Ht 71.0 in | Wt 188.0 lb

## 2013-02-26 DIAGNOSIS — E291 Testicular hypofunction: Secondary | ICD-10-CM

## 2013-02-26 DIAGNOSIS — R000 Tachycardia, unspecified: Secondary | ICD-10-CM

## 2013-02-26 DIAGNOSIS — I10 Essential (primary) hypertension: Secondary | ICD-10-CM

## 2013-02-26 MED ORDER — METOPROLOL SUCCINATE ER 50 MG PO TB24: 150.0000 mg | ORAL_TABLET | Freq: Every evening | ORAL | Status: DC

## 2013-02-26 MED ORDER — TESTOSTERONE CYPIONATE 100 MG/ML IM SOLN: 400.0000 mg | INTRAMUSCULAR | Status: DC

## 2013-02-26 NOTE — Patient Instructions (Addendum)
It was nice to see you! We are increasing your Metoprolol today.  I want you to take 150mg  every night.  I have sent in a new prescription for 50mg  tablets, please take 3 every evening. Please inject 400mg  of testosterone every 2 weeks. Follow up with me in 6 weeks to check on your blood pressure, pulse and testosterone level.  We will make adjustments as needed after that appointment.

## 2013-02-26 NOTE — Assessment & Plan Note (Signed)
Not at goal today.  Will increase metoprolol to 150mg  qHS.  Follow up in 6 weeks.  If still elevated, I will go up on metoprolol if pulse still elevated or lisinopril is pulse <100.

## 2013-02-26 NOTE — Assessment & Plan Note (Addendum)
Persistent tachycardia with multiple EKGs showing sinus tachycardia.  Will increased metoprolol to 150mg  qHS.  Follow up in 6 weeks.

## 2013-02-26 NOTE — Assessment & Plan Note (Signed)
Testosterone low at last check.  Will increase dose to 400mg  q14 days.  Follow up in 6 weeks for recheck.  PSA wnl.  Will continue to monitor every 3-6 months.  Discussed risks and benefits of testosterone replacement including increased risk of heart disease, stroke and prostate cancer.  Patient wishes to continue as he has significant fatigue.  Discussed that the fatigue is likely multifactorial.  Also discussed that 400mg  q14 days is the max dose recommended by the FDA.

## 2013-02-26 NOTE — Progress Notes (Signed)
  Subjective:    Patient ID: [name], male    DOB: 12/03/1956, 57 y.o.   MRN: [id number]  HPI [name] is here for f/u htn and pulse.  Hypertension Well controlled: no Compliant with medication: yes Side effects from medication: no Check BP at home: no  Chest pain: no Palpitations: no Vision changes: no Leg edema: no Dizziness: no  Pulse His pulse remains elevated.  He has purchased a wrist heart rate monitor which also shows consistently elevated heart rate.  He denies any palpitations or syncope.  Androgen deficiency He states that he has been taking 200mg  testosterone every 2 to 2 1/2 weeks.  Continues to have significant fatigue, sometimes staying in bed 18 hours.  He does acknowledge that some of his fatigue likely comes from his medications and his depression.  He saw his psychiatrist last week; no medication changes were made.  I have reviewed and updated the following as appropriate: allergies and current medications SHx: non smoker  Review of Systems See HPI    Objective:   Physical Exam BP 125/107  Pulse 125  Ht 5\' 11"  (1.803 m)  Wt 188 lb (85.276 kg)  BMI 26.23 kg/m2 Gen: alert, cooperative, NAD HEENT: AT/[place], sclera white, MMM Neck: supple CV: tachycardic, no murmurs Pulm: CTAB, no wheezes or rales Ext: no edema, 2+ DP pulses bilaterally     Assessment & Plan:

## 2013-03-17 ENCOUNTER — Telehealth: Payer: Self-pay | Admitting: Emergency Medicine

## 2013-03-17 NOTE — Telephone Encounter (Signed)
Patient is calling for a refill on his Klonopin to go to [facility] on [address].  The pharmacy was to fax the request yesterday.

## 2013-03-17 NOTE — Telephone Encounter (Signed)
Forward to PCP for refill

## 2013-03-18 MED ORDER — CLONAZEPAM 2 MG PO TABS: 5.0000 mg | ORAL_TABLET | Freq: Two times a day (BID) | ORAL | Status: DC

## 2013-03-18 NOTE — Telephone Encounter (Signed)
Left message for patient to return call, please tell him Rx was sent to pharmacy.Shikha Bibb, [name]

## 2013-03-18 NOTE — Telephone Encounter (Signed)
I do not prescribe his klonopin, his psychiatrist at [name] is the one prescribing this.

## 2013-03-18 NOTE — Telephone Encounter (Signed)
Called patient to tell him [name] needs to refill this medication. He says it has always been prescribed from here for the past several years. He is asking for the med to be filled by Dr Elwyn Reach or would like to speak to her about this.[name], [name]

## 2013-03-18 NOTE — Telephone Encounter (Signed)
I have called in a prescription for 1 month, klonopin 5mg  BID.  I will discuss further with him at his appt 4/25.

## 2013-03-18 NOTE — Telephone Encounter (Signed)
Pt is calling again - needs this asap

## 2013-04-10 ENCOUNTER — Ambulatory Visit: Payer: Medicaid Other | Admitting: Emergency Medicine

## 2013-04-17 ENCOUNTER — Other Ambulatory Visit: Payer: Self-pay | Admitting: Emergency Medicine

## 2013-04-17 MED ORDER — CLONAZEPAM 2 MG PO TABS: 5.0000 mg | ORAL_TABLET | Freq: Two times a day (BID) | ORAL | Status: DC

## 2013-04-17 NOTE — Telephone Encounter (Signed)
Left instruction on pharmacy voicemail that patient needs appointment with me prior to additional refills.

## 2013-04-21 ENCOUNTER — Encounter: Payer: Self-pay | Admitting: Emergency Medicine

## 2013-04-21 ENCOUNTER — Ambulatory Visit (INDEPENDENT_AMBULATORY_CARE_PROVIDER_SITE_OTHER): Payer: Medicaid Other | Admitting: Emergency Medicine

## 2013-04-21 VITALS — BP 118/68 | HR 84 | Temp 99.3°F | Ht 71.0 in | Wt 189.0 lb

## 2013-04-21 DIAGNOSIS — R5383 Other fatigue: Secondary | ICD-10-CM

## 2013-04-21 DIAGNOSIS — R5381 Other malaise: Secondary | ICD-10-CM

## 2013-04-21 DIAGNOSIS — R000 Tachycardia, unspecified: Secondary | ICD-10-CM

## 2013-04-21 DIAGNOSIS — I10 Essential (primary) hypertension: Secondary | ICD-10-CM

## 2013-04-21 DIAGNOSIS — E291 Testicular hypofunction: Secondary | ICD-10-CM

## 2013-04-21 LAB — POCT GLYCOSYLATED HEMOGLOBIN (HGB A1C): Hemoglobin A1C: 5.6

## 2013-04-21 MED ORDER — CLONAZEPAM 2 MG PO TABS: 5.0000 mg | ORAL_TABLET | Freq: Two times a day (BID) | ORAL | Status: DC

## 2013-04-21 NOTE — Progress Notes (Signed)
  Subjective:    Patient ID: [name], male    DOB: 11/30/1956, 57 y.o.   MRN: [id number]  HPI [name] is here for f/u.  Hypertension Well controlled: yes Compliant with medication: yes Side effects from medication: no Check BP at home: no  Chest pain: no Palpitations: no Vision changes: no Leg edema: no Dizziness: no  Fatigue He reports that over the last 8 months he has had gradually worsening postprandial fatigue.  He states that is has gotten to the point where if he eats anything, he is unable to even keep his eyes open for 6-8 hours afterwards.  All he can take is water.  He has tried proteins and still has the fatigue.  He is fine as long as he doesn't eat anything.  He is concerned about hypoglycemia and tried eating some donuts when he got tired, but this did not help.  No relation to his testosterone injections.    Androgen deficiency Has been doing well with injecting 400mg  every 2 weeks.  I have reviewed and updated the following as appropriate: allergies and current medications SHx: non smoker    Review of Systems See HPI     Objective:   Physical Exam BP 118/68  Pulse 84  Temp(Src) 99.3 F (37.4 C) (Oral)  Ht 5\' 11"  (1.803 m)  Wt 189 lb (85.73 kg)  BMI 26.37 kg/m2 Gen: alert, cooperative, NAD HEENT: AT/[place], sclera white, MMM Neck: supple CV: RRR, no murmurs Pulm: CTAB, no wheezes or rales Ext: no edema     Assessment & Plan:

## 2013-04-21 NOTE — Patient Instructions (Addendum)
It was nice to see you! Please try to walk outside for 15 minutes a day.  Eat small meals high in protein and fiber (meat, eggs, small amounts of yogurt, leafy green vegetables). Make a lab appointment for next week to check your testosterone. Follow up in 2-4 weeks.

## 2013-04-21 NOTE — Assessment & Plan Note (Signed)
Will recheck testosterone level next week about 5-7 days before next injection.

## 2013-04-21 NOTE — Assessment & Plan Note (Signed)
Much improved today.  Continue metoprolol.

## 2013-04-21 NOTE — Assessment & Plan Note (Signed)
Only after eating.  May be related to transient hypoglycemia but lasts for a long time and does not respond to sugar rich foods.  No relation to testosterone shots.  Will check A1c today.  May be related to his severe depression.  Discussed small, frequent meal of low glycemic index foods.  Also recommended daily exercise with 15 minutes of walking outdoors.  Follow up in 2-4 weeks.

## 2013-04-21 NOTE — Assessment & Plan Note (Signed)
Well controlled. Continue current medications  

## 2013-04-28 ENCOUNTER — Other Ambulatory Visit: Payer: Medicaid Other

## 2013-04-28 DIAGNOSIS — R5383 Other fatigue: Secondary | ICD-10-CM

## 2013-04-28 NOTE — Progress Notes (Signed)
TESTOSTERONE DONE TODAY [name] 

## 2013-04-29 ENCOUNTER — Telehealth: Payer: Self-pay | Admitting: Emergency Medicine

## 2013-04-29 LAB — TESTOSTERONE: Testosterone: 1386 ng/dL — ABNORMAL HIGH (ref 300–890)

## 2013-04-29 MED ORDER — TESTOSTERONE CYPIONATE 100 MG/ML IM SOLN: 300.0000 mg | INTRAMUSCULAR | Status: DC

## 2013-04-29 NOTE — Telephone Encounter (Signed)
Called and reviewed lab results with the patient.  Testosterone level is quite elevated.  Will have patient decrease dose to 300mg  q14 days.  Will need recheck in the next month.  He also asked about his sugars and his postprandial sleepiness.  He has a f/u appt with me later this month, and I told him we would discuss it then.  He expressed understanding and agreement with this plan.

## 2013-05-15 ENCOUNTER — Ambulatory Visit (INDEPENDENT_AMBULATORY_CARE_PROVIDER_SITE_OTHER): Payer: Medicaid Other | Admitting: Emergency Medicine

## 2013-05-15 ENCOUNTER — Encounter: Payer: Self-pay | Admitting: Emergency Medicine

## 2013-05-15 VITALS — BP 116/76 | HR 91 | Ht 71.0 in | Wt 187.6 lb

## 2013-05-15 DIAGNOSIS — R000 Tachycardia, unspecified: Secondary | ICD-10-CM

## 2013-05-15 DIAGNOSIS — I10 Essential (primary) hypertension: Secondary | ICD-10-CM

## 2013-05-15 DIAGNOSIS — E291 Testicular hypofunction: Secondary | ICD-10-CM

## 2013-05-15 NOTE — Assessment & Plan Note (Signed)
Well controlled. Continue current meds

## 2013-05-15 NOTE — Progress Notes (Signed)
  Subjective:    Patient ID: [name], male    DOB: 04-13-1956, 57 y.o.   MRN: [id number]  HPI [name] is here for f/u medication changes and testosterone.  1. Medication changes: His blood pressure and pulse are much better on the metoprolol and lisinopril.  He is doing well with the medications.  No chest pain or dizziness.  2. Testosterone: He has cut back to 300mg  per injection.  Last injection 1 week ago.   I have reviewed and updated the following as appropriate: allergies and current medications SHx: non smoker  Review of Systems See HPI    Objective:   Physical Exam BP 116/76  Pulse 91  Ht 5\' 11"  (1.803 m)  Wt 187 lb 9.6 oz (85.095 kg)  BMI 26.18 kg/m2 Gen: alert, cooperative, NAD HEENT: AT/[place], sclera white, MMM Neck: supple CV: RRR, no murmurs Pulm: CTAB, no wheezes or rales Ext: no edema, 2+ DP pulses bilaterally     Assessment & Plan:

## 2013-05-15 NOTE — Assessment & Plan Note (Signed)
Will recheck testosterone level today.  Adjust dose based on results.  PSA due soon.

## 2013-05-15 NOTE — Patient Instructions (Addendum)
It was nice to see you! It looks like things are going pretty well. I will call you with the results of your blood test next week. Follow up in 3 months - if I need to see you sooner based on the blood work, I'll let you know.

## 2013-05-15 NOTE — Assessment & Plan Note (Signed)
Much improved on metoprolol.  No changes today.

## 2013-05-16 LAB — TESTOSTERONE: Testosterone: 966 ng/dL — ABNORMAL HIGH (ref 300–890)

## 2013-05-18 ENCOUNTER — Other Ambulatory Visit: Payer: Self-pay | Admitting: *Deleted

## 2013-05-18 MED ORDER — CLONAZEPAM 2 MG PO TABS: 5.0000 mg | ORAL_TABLET | Freq: Two times a day (BID) | ORAL | Status: DC

## 2013-05-20 ENCOUNTER — Other Ambulatory Visit: Payer: Self-pay | Admitting: Emergency Medicine

## 2013-05-20 ENCOUNTER — Telehealth: Payer: Self-pay | Admitting: Emergency Medicine

## 2013-05-20 DIAGNOSIS — E291 Testicular hypofunction: Secondary | ICD-10-CM

## 2013-05-20 MED ORDER — TESTOSTERONE CYPIONATE 100 MG/ML IM SOLN: 250.0000 mg | INTRAMUSCULAR | Status: DC

## 2013-05-20 NOTE — Telephone Encounter (Signed)
Called and spoke with patient regarding testosterone level.  Instructed to take 250mg  (2.58mL).  Will recheck the last week of June 1 week before injection.  He again mentions that he gets really tired after eating.  As far as I can tell, there is no medical reason for this.  There is no evidence for postprandial hypoglycemia or diabetes.  Discussed trying to go for a 20 minute walk after eating to see if that will help.  Recommended that he follow up with me in the next few weeks.

## 2013-05-21 ENCOUNTER — Other Ambulatory Visit: Payer: Self-pay | Admitting: *Deleted

## 2013-05-22 MED ORDER — PHENYTOIN 50 MG PO CHEW: 200.0000 mg | CHEWABLE_TABLET | Freq: Every day | ORAL | Status: DC

## 2013-06-12 ENCOUNTER — Telehealth: Payer: Self-pay | Admitting: Emergency Medicine

## 2013-06-12 NOTE — Telephone Encounter (Signed)
Pt is supposed to be taking testosterone 250mg  every 14 days.  He was calling because he could not read the bottle clearly enough.  I called the pharmacy and they never received updated testosterone dose on 05/20/2013.  To ensure patient had the 100mg /mL vial, he is going to take to the pharmacy to check.  Kyuss Hale, Darlyne Russian, CMA

## 2013-06-12 NOTE — Telephone Encounter (Signed)
Patient is request a call on how much of the testosterone he should take. He is confused by what the bottle says. JW

## 2013-06-17 ENCOUNTER — Ambulatory Visit (INDEPENDENT_AMBULATORY_CARE_PROVIDER_SITE_OTHER): Payer: Medicaid Other | Admitting: Emergency Medicine

## 2013-06-17 ENCOUNTER — Encounter: Payer: Self-pay | Admitting: Emergency Medicine

## 2013-06-17 VITALS — BP 108/70 | HR 93 | Ht 71.0 in | Wt 191.0 lb

## 2013-06-17 DIAGNOSIS — E291 Testicular hypofunction: Secondary | ICD-10-CM

## 2013-06-17 NOTE — Assessment & Plan Note (Addendum)
Taking 250mg  every 14 days.  Will check level today.

## 2013-06-17 NOTE — Progress Notes (Signed)
  Subjective:    Patient ID: Maejor Erven, male    DOB: Apr 24, 1956, 57 y.o.   MRN: [id number]  HPI Kalmen Lollar is here for dmv paperwork.  1. DMV paperwork: Due for medical release paperwork for DMV.  2. Androgen deficiency: He has been taking 250mg  every 14 days.  Injecting 1.81mLs as he reports the current bottle is more concentrated.   I have reviewed and updated the following as appropriate: allergies and current medications SHx: never smoker  Review of Systems See HPI    Objective:   Physical Exam BP 108/70  Pulse 93  Ht 5\' 11"  (1.803 m)  Wt 191 lb (86.637 kg)  BMI 26.65 kg/m2 Gen: alert, cooperative, NAD Mood: appears more alert and happier than prior visits     Assessment & Plan:  DMV form filled out today and return to patient.

## 2013-06-18 ENCOUNTER — Telehealth: Payer: Self-pay | Admitting: Emergency Medicine

## 2013-06-18 LAB — TESTOSTERONE: Testosterone: 836.07 ng/dL (ref 300–890)

## 2013-06-18 NOTE — Telephone Encounter (Signed)
Called to let patient know of testosterone level.  Level is where we want it.  Continue the 250mg  q14days dosing.  Follow up in 1-2 months for blood pressure and to check PSA.

## 2013-07-11 IMAGING — CT CT MAXILLOFACIAL W/O CM
1 of 3 series · 15 of 30 positions shown, 19 images · non-contrast
Comparison: 04/18/2011

CT HEAD

CLINICAL DATA: Fall

CT HEAD WITHOUT CONTRAST
CT MAXILLOFACIAL WITHOUT CONTRAST
TECHNIQUE: Multidetector CT imaging of the head and maxillofacial
structures were performed using the standard protocol without
intravenous contrast. Multiplanar CT image reconstructions of the
maxillofacial structures were also generated.

[Series 4: facial st · axial · 0.36mm/px · z∈[-248,-108]mm · 15 of 81 slices shown, 19 images]
[im 6/81  brain]
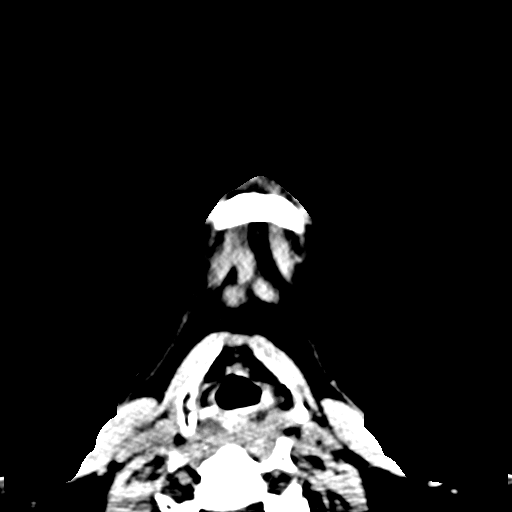
[im 6/81  bone]
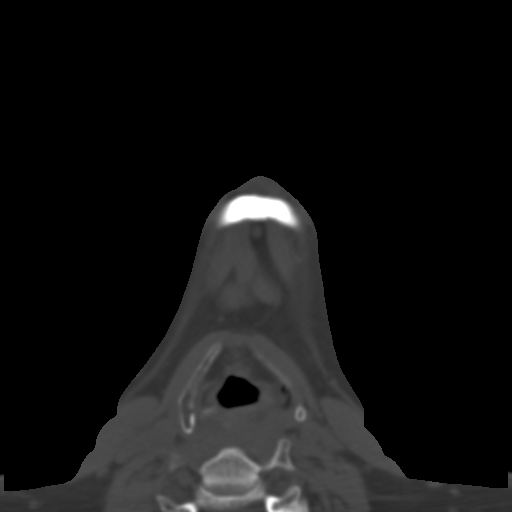
[im 11/81  bone]
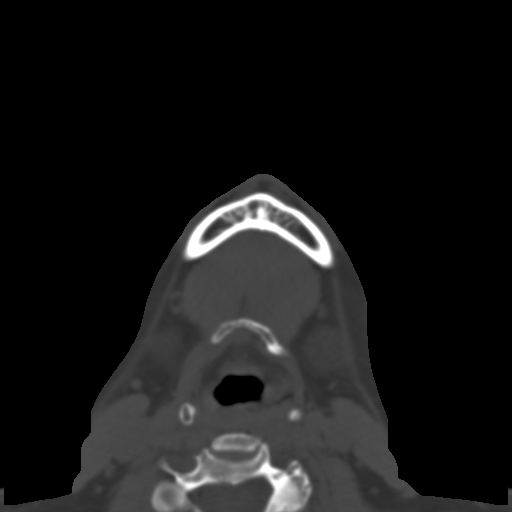
[im 16/81  bone]
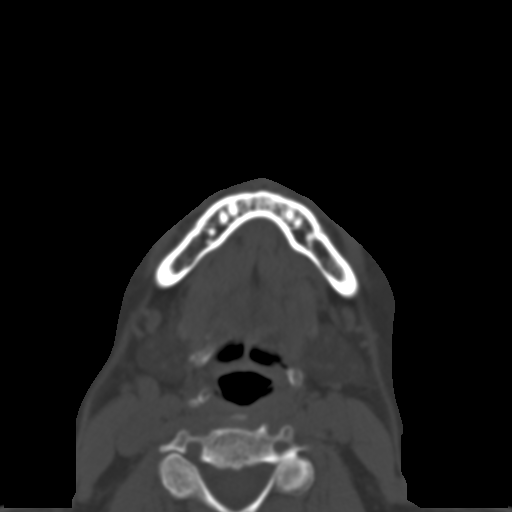
[im 21/81  bone]
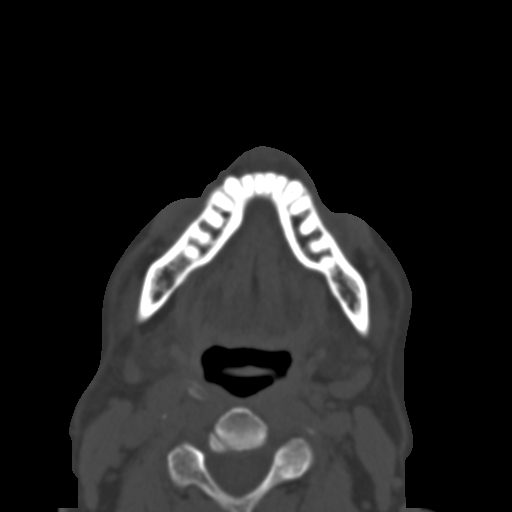
[im 26/81  brain]
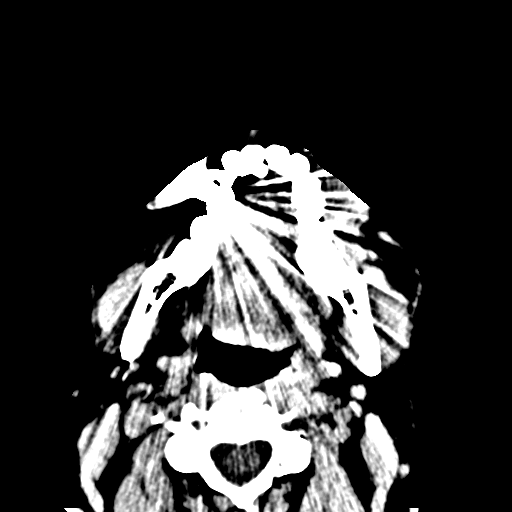
[im 26/81  bone]
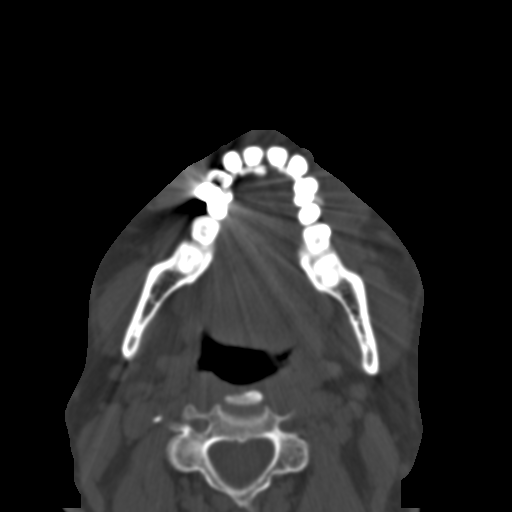
[im 31/81  bone]
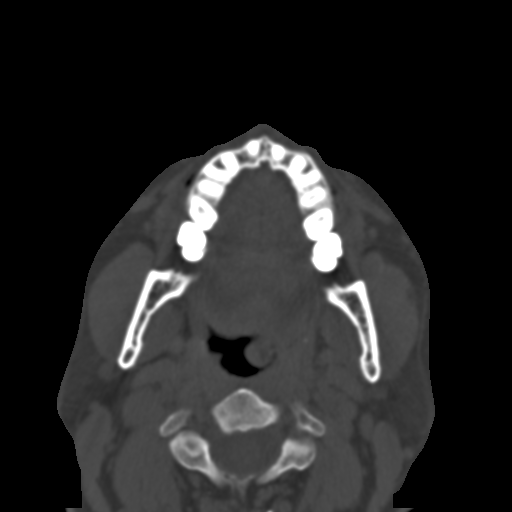
[im 36/81  bone]
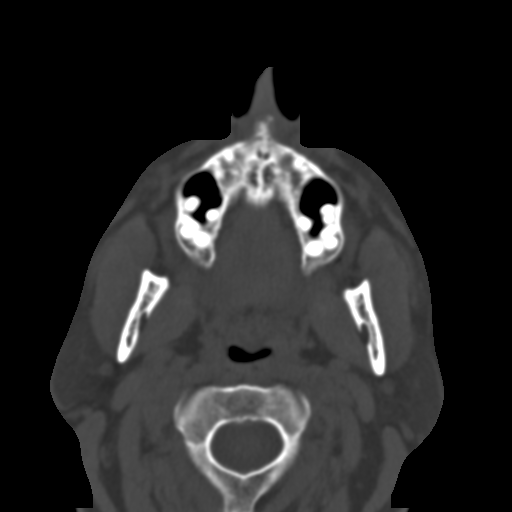
[im 41/81  bone]
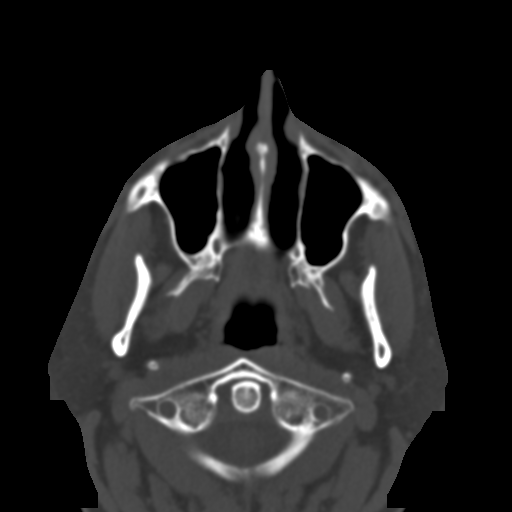
[im 46/81  brain]
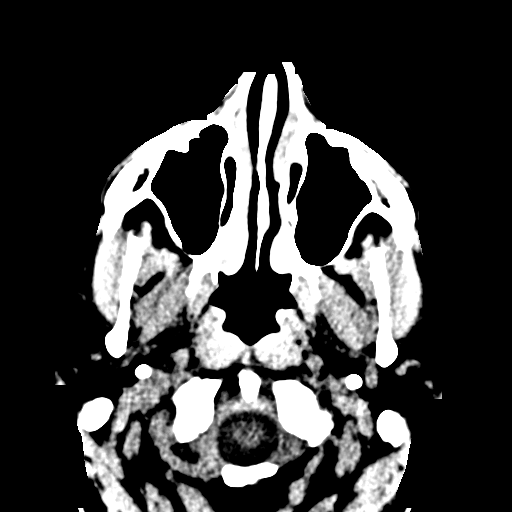
[im 46/81  bone]
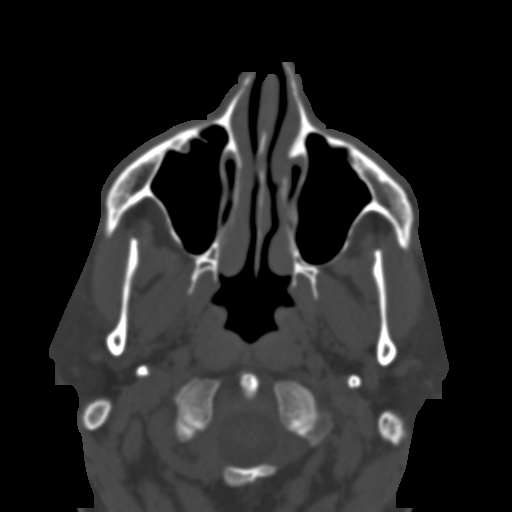
[im 51/81  bone]
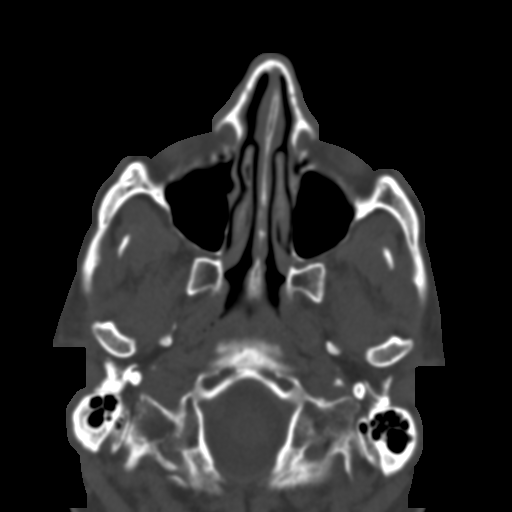
[im 56/81  bone]
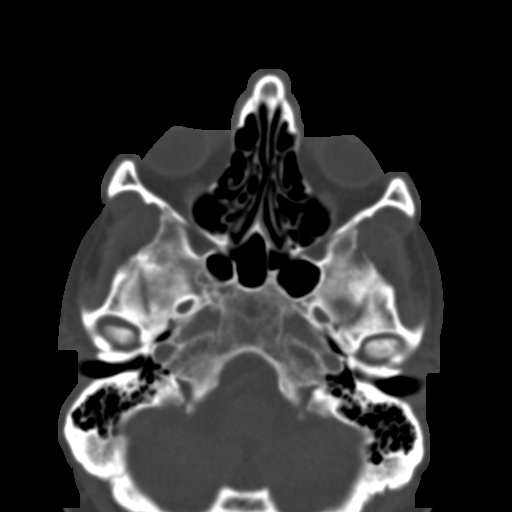
[im 61/81  bone]
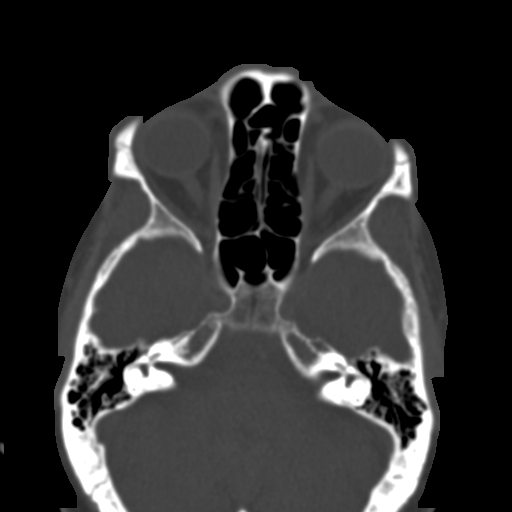
[im 66/81  brain]
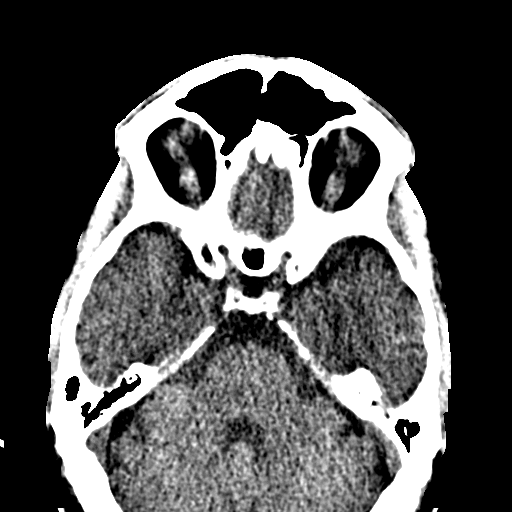
[im 66/81  bone]
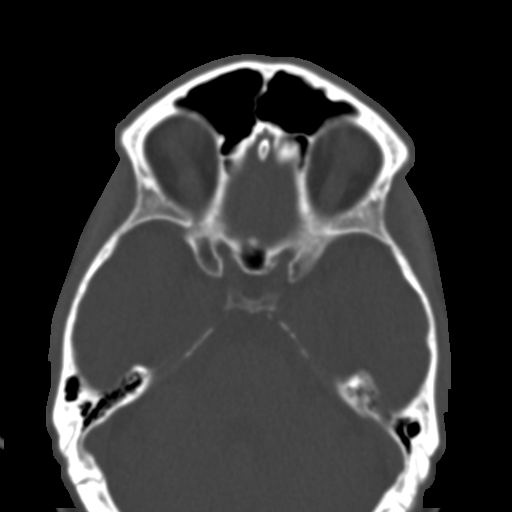
[im 71/81  bone]
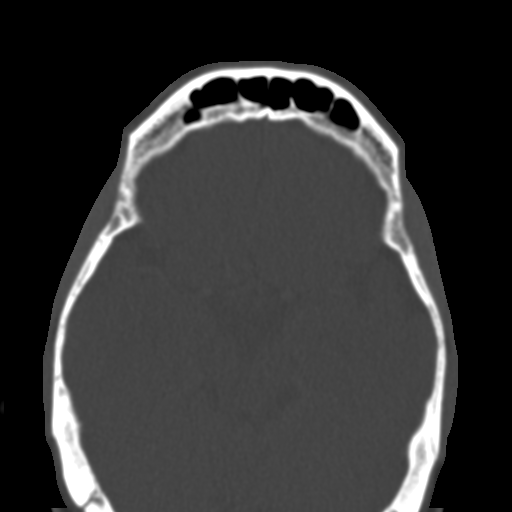
[im 76/81  bone]
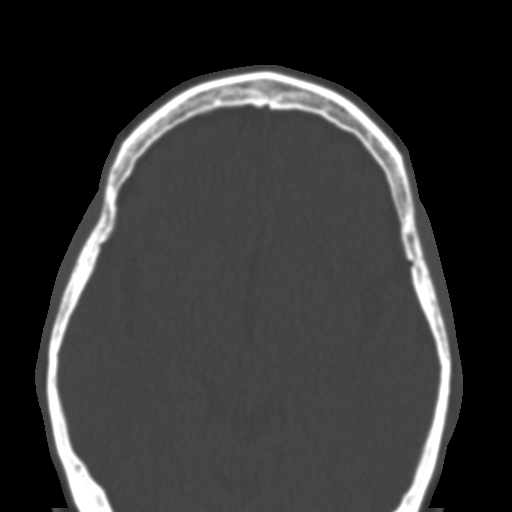

[15 of 30 positions shown; findings below may reference images not displayed]

FINDINGS: No mass effect, midline shift, or acute intracranial
hemorrhage.  Cranium is intact.  Mastoid air cells and visualized
paranasal sinuses are clear.
IMPRESSION: No acute intracranial pathology.

CT MAXILLOFACIAL
FINDINGS: No acute fracture.  No dislocation.  Mastoid air cells
and paranasal sinuses are clear other than a small mucous retention
cyst in the right middle ethmoid air cell.
IMPRESSION: No evidence of facial bone injury.

## 2013-07-19 ENCOUNTER — Other Ambulatory Visit: Payer: Self-pay | Admitting: Emergency Medicine

## 2013-07-21 NOTE — Telephone Encounter (Signed)
Patient is calling to find out the status of the 2 meds as he is waiting for those 2 to be filled before he goes to pick up the rest.

## 2013-07-22 ENCOUNTER — Other Ambulatory Visit: Payer: Self-pay | Admitting: Emergency Medicine

## 2013-07-22 DIAGNOSIS — I10 Essential (primary) hypertension: Secondary | ICD-10-CM

## 2013-07-22 NOTE — Telephone Encounter (Signed)
Pt is calling to let the doctor know that the pharmacy has faxed refill request to [place] twice yesterday 8/5, and he wanted a status update. JW

## 2013-07-23 ENCOUNTER — Other Ambulatory Visit: Payer: Self-pay | Admitting: *Deleted

## 2013-07-23 DIAGNOSIS — I10 Essential (primary) hypertension: Secondary | ICD-10-CM

## 2013-07-23 NOTE — Telephone Encounter (Signed)
I called pharmacy since Dr. Elwyn Reach is unavailable and I haven't seen any refill requests for patient.  Pharmacist states pt needs his Miralax refilled and his Toprol XL refilled.  I refilled both medications, one months supply, with 1 refill.  Wiliam Cauthorn, Darlyne Russian, CMA

## 2013-07-23 NOTE — Telephone Encounter (Signed)
Patient is calling because he has been trying for days to get his medicines.  His pharmacy has said that they sent the refill request several times but have not heard back.  Now, because it is 8/7 and he has Medicaid, it throws off when he can get his meds and this is a real problem for him.  He is asking for this to please be taken care of today.

## 2013-07-24 MED ORDER — METOPROLOL SUCCINATE ER 50 MG PO TB24: 150.0000 mg | ORAL_TABLET | Freq: Every evening | ORAL | Status: DC

## 2013-07-24 MED ORDER — POLYETHYLENE GLYCOL 3350 17 G PO PACK: 17.0000 g | PACK | Freq: Every day | ORAL | Status: DC | PRN

## 2013-08-04 ENCOUNTER — Telehealth: Payer: Self-pay | Admitting: Emergency Medicine

## 2013-08-04 NOTE — Telephone Encounter (Signed)
Per Dr.Honig's note from 06-17-13. Forms were given to pt. Marland KitchenLorenda [name], [name]

## 2013-08-04 NOTE — Telephone Encounter (Signed)
Pt is calling to see the status of the DMV paperwork. Did Dr. Piedad Climes fax or mail it to [facility]? He called DMV and they do not have the paperwork. JW

## 2013-08-06 NOTE — Telephone Encounter (Signed)
I returned to the paperwork to the patient during our last office visit.  I will fill out the form again, if needed, he just needs to drop it off at the office.

## 2013-08-06 NOTE — Telephone Encounter (Signed)
Called pt. Unable to reach (unavailable) waiting for pt to call back. Please see previous messages. Thank you. Lorenda Hatchet, [name]

## 2013-09-16 ENCOUNTER — Emergency Department (HOSPITAL_COMMUNITY): Payer: Medicaid Other

## 2013-09-16 ENCOUNTER — Emergency Department (HOSPITAL_COMMUNITY): Payer: Medicaid Other | Admitting: Anesthesiology

## 2013-09-16 ENCOUNTER — Inpatient Hospital Stay (HOSPITAL_COMMUNITY)
Admission: EM | Admit: 2013-09-16 | Discharge: 2013-09-22 | DRG: 483 | Disposition: A | Discharge status: Home / Self Care | Payer: Medicaid Other | Attending: Orthopedic Surgery | Admitting: Orthopedic Surgery

## 2013-09-16 ENCOUNTER — Encounter (HOSPITAL_COMMUNITY): Payer: Self-pay | Admitting: Anesthesiology

## 2013-09-16 ENCOUNTER — Encounter (HOSPITAL_COMMUNITY)
Admission: EM | Disposition: A | Discharge status: Home / Self Care | Payer: Self-pay | Source: Home / Self Care | Attending: Orthopedic Surgery

## 2013-09-16 DIAGNOSIS — G40909 Epilepsy, unspecified, not intractable, without status epilepticus: Secondary | ICD-10-CM

## 2013-09-16 DIAGNOSIS — F259 Schizoaffective disorder, unspecified: Secondary | ICD-10-CM | POA: Diagnosis present

## 2013-09-16 DIAGNOSIS — I10 Essential (primary) hypertension: Secondary | ICD-10-CM | POA: Diagnosis present

## 2013-09-16 DIAGNOSIS — F209 Schizophrenia, unspecified: Secondary | ICD-10-CM | POA: Diagnosis present

## 2013-09-16 DIAGNOSIS — S43022A Posterior subluxation of left humerus, initial encounter: Secondary | ICD-10-CM

## 2013-09-16 DIAGNOSIS — K5900 Constipation, unspecified: Secondary | ICD-10-CM | POA: Diagnosis not present

## 2013-09-16 DIAGNOSIS — S42213A Unspecified displaced fracture of surgical neck of unspecified humerus, initial encounter for closed fracture: Principal | ICD-10-CM | POA: Diagnosis present

## 2013-09-16 DIAGNOSIS — M7500 Adhesive capsulitis of unspecified shoulder: Secondary | ICD-10-CM | POA: Diagnosis present

## 2013-09-16 DIAGNOSIS — S42202A Unspecified fracture of upper end of left humerus, initial encounter for closed fracture: Secondary | ICD-10-CM

## 2013-09-16 DIAGNOSIS — S43026A Posterior dislocation of unspecified humerus, initial encounter: Secondary | ICD-10-CM | POA: Diagnosis present

## 2013-09-16 DIAGNOSIS — F411 Generalized anxiety disorder: Secondary | ICD-10-CM | POA: Diagnosis present

## 2013-09-16 DIAGNOSIS — I959 Hypotension, unspecified: Secondary | ICD-10-CM | POA: Diagnosis not present

## 2013-09-16 DIAGNOSIS — R400 Somnolence: Secondary | ICD-10-CM | POA: Diagnosis not present

## 2013-09-16 DIAGNOSIS — Z882 Allergy status to sulfonamides status: Secondary | ICD-10-CM

## 2013-09-16 DIAGNOSIS — G92 Toxic encephalopathy: Secondary | ICD-10-CM | POA: Diagnosis not present

## 2013-09-16 DIAGNOSIS — K5909 Other constipation: Secondary | ICD-10-CM | POA: Diagnosis present

## 2013-09-16 DIAGNOSIS — E785 Hyperlipidemia, unspecified: Secondary | ICD-10-CM | POA: Diagnosis present

## 2013-09-16 DIAGNOSIS — F3289 Other specified depressive episodes: Secondary | ICD-10-CM | POA: Diagnosis present

## 2013-09-16 DIAGNOSIS — S4292XA Fracture of left shoulder girdle, part unspecified, initial encounter for closed fracture: Secondary | ICD-10-CM

## 2013-09-16 DIAGNOSIS — F329 Major depressive disorder, single episode, unspecified: Secondary | ICD-10-CM | POA: Diagnosis present

## 2013-09-16 DIAGNOSIS — G929 Unspecified toxic encephalopathy: Secondary | ICD-10-CM | POA: Diagnosis not present

## 2013-09-16 DIAGNOSIS — Z8614 Personal history of Methicillin resistant Staphylococcus aureus infection: Secondary | ICD-10-CM

## 2013-09-16 DIAGNOSIS — Z7982 Long term (current) use of aspirin: Secondary | ICD-10-CM

## 2013-09-16 DIAGNOSIS — K219 Gastro-esophageal reflux disease without esophagitis: Secondary | ICD-10-CM | POA: Diagnosis present

## 2013-09-16 HISTORY — PX: SHOULDER CLOSED REDUCTION: SHX1051

## 2013-09-16 LAB — CBC WITH DIFFERENTIAL/PLATELET
Basophils Absolute: 0 10*3/uL (ref 0.0–0.1)
Basophils Relative: 0 % (ref 0–1)
Eosinophils Absolute: 0 10*3/uL (ref 0.0–0.7)
Eosinophils Relative: 0 % (ref 0–5)
HCT: 43.6 % (ref 39.0–52.0)
Hemoglobin: 15.6 g/dL (ref 13.0–17.0)
Lymphocytes Relative: 5 % — ABNORMAL LOW (ref 12–46)
Lymphs Abs: 0.8 10*3/uL (ref 0.7–4.0)
MCH: 33.3 pg (ref 26.0–34.0)
MCHC: 35.8 g/dL (ref 30.0–36.0)
MCV: 93 fL (ref 78.0–100.0)
Monocytes Absolute: 0.7 10*3/uL (ref 0.1–1.0)
Monocytes Relative: 5 % (ref 3–12)
Neutro Abs: 13.6 10*3/uL — ABNORMAL HIGH (ref 1.7–7.7)
Neutrophils Relative %: 90 % — ABNORMAL HIGH (ref 43–77)
Platelets: 244 10*3/uL (ref 150–400)
RBC: 4.69 MIL/uL (ref 4.22–5.81)
RDW: 13.8 % (ref 11.5–15.5)
WBC: 15.1 10*3/uL — ABNORMAL HIGH (ref 4.0–10.5)

## 2013-09-16 LAB — BASIC METABOLIC PANEL
BUN: 17 mg/dL (ref 6–23)
CO2: 24 mEq/L (ref 19–32)
Calcium: 9.6 mg/dL (ref 8.4–10.5)
Chloride: 96 mEq/L (ref 96–112)
Creatinine, Ser: 0.84 mg/dL (ref 0.50–1.35)
GFR calc Af Amer: >90 mL/min (ref >90)
GFR calc non Af Amer: >90 mL/min (ref >90)
Glucose, Bld: 133 mg/dL — ABNORMAL HIGH (ref 70–99)
Potassium: 4.1 mEq/L (ref 3.5–5.1)
Sodium: 131 mEq/L — ABNORMAL LOW (ref 135–145)

## 2013-09-16 SURGERY — CLOSED REDUCTION, SHOULDER
Anesthesia: Monitor Anesthesia Care | Site: Shoulder | Laterality: Left | Wound class: Clean

## 2013-09-16 MED ORDER — QUETIAPINE FUMARATE 300 MG PO TABS
900.0000 mg | ORAL_TABLET | Freq: Every day | ORAL | Status: DC
  Administered 2013-09-17 – 2013-09-21 (×6): 900 mg via ORAL
  Filled 2013-09-16 (×8): qty 3

## 2013-09-16 MED ORDER — PHENYTOIN 50 MG PO CHEW
200.0000 mg | CHEWABLE_TABLET | Freq: Every day | ORAL | Status: DC
  Administered 2013-09-17 – 2013-09-21 (×6): 200 mg via ORAL
  Filled 2013-09-16 (×8): qty 4

## 2013-09-16 MED ORDER — METOCLOPRAMIDE HCL 5 MG/ML IJ SOLN: 10.0000 mg | Freq: Once | INTRAMUSCULAR | Status: DC | PRN

## 2013-09-16 MED ORDER — PROPOFOL 10 MG/ML IV BOLUS
0.7500 mg/kg | INTRAVENOUS | Status: DC | PRN
  Administered 2013-09-16: 65 mg via INTRAVENOUS
  Filled 2013-09-16: qty 1

## 2013-09-16 MED ORDER — ALPRAZOLAM 1 MG PO TABS: 1.0000 mg | ORAL_TABLET | Freq: Three times a day (TID) | ORAL | Status: DC

## 2013-09-16 MED ORDER — LACTATED RINGERS IV SOLN
INTRAVENOUS | Status: DC | PRN
  Administered 2013-09-16: 22:00:00 via INTRAVENOUS

## 2013-09-16 MED ORDER — DOXEPIN HCL 100 MG PO CAPS
900.0000 mg | ORAL_CAPSULE | Freq: Every day | ORAL | Status: DC
  Administered 2013-09-17 – 2013-09-19 (×4): 900 mg via ORAL
  Filled 2013-09-16 (×6): qty 9

## 2013-09-16 MED ORDER — PROPOFOL 10 MG/ML IV BOLUS
INTRAVENOUS | Status: DC | PRN
  Administered 2013-09-16: 60 mg via INTRAVENOUS
  Administered 2013-09-16: 200 mg via INTRAVENOUS

## 2013-09-16 MED ORDER — PANTOPRAZOLE SODIUM 40 MG PO TBEC
40.0000 mg | DELAYED_RELEASE_TABLET | Freq: Every day | ORAL | Status: DC
  Administered 2013-09-17 – 2013-09-22 (×5): 40 mg via ORAL
  Filled 2013-09-16 (×4): qty 1

## 2013-09-16 MED ORDER — ONDANSETRON HCL 4 MG PO TABS: 4.0000 mg | ORAL_TABLET | Freq: Four times a day (QID) | ORAL | Status: DC | PRN

## 2013-09-16 MED ORDER — METOCLOPRAMIDE HCL 5 MG/ML IJ SOLN
5.0000 mg | Freq: Three times a day (TID) | INTRAMUSCULAR | Status: DC | PRN
Start: 2013-09-16 — End: 2013-09-22

## 2013-09-16 MED ORDER — CALCIUM CARBONATE ANTACID 500 MG PO CHEW
1.0000 | CHEWABLE_TABLET | Freq: Every day | ORAL | Status: DC | PRN
  Filled 2013-09-16 (×2): qty 1

## 2013-09-16 MED ORDER — LISINOPRIL 5 MG PO TABS
5.0000 mg | ORAL_TABLET | Freq: Every day | ORAL | Status: DC
  Administered 2013-09-17 – 2013-09-22 (×5): 5 mg via ORAL
  Filled 2013-09-16 (×6): qty 1

## 2013-09-16 MED ORDER — KETOROLAC TROMETHAMINE 30 MG/ML IJ SOLN
INTRAMUSCULAR | Status: AC
  Administered 2013-09-16: 30 mg
  Filled 2013-09-16: qty 1

## 2013-09-16 MED ORDER — HYDROMORPHONE HCL PF 1 MG/ML IJ SOLN: 0.2500 mg | INTRAMUSCULAR | Status: DC | PRN

## 2013-09-16 MED ORDER — ONDANSETRON HCL 4 MG/2ML IJ SOLN: 4.0000 mg | Freq: Four times a day (QID) | INTRAMUSCULAR | Status: DC | PRN

## 2013-09-16 MED ORDER — HYDROCODONE-ACETAMINOPHEN 5-325 MG PO TABS
1.0000 | ORAL_TABLET | ORAL | Status: DC | PRN
  Administered 2013-09-17: 17:00:00 1 via ORAL
  Administered 2013-09-17: 2 via ORAL
  Administered 2013-09-17: 01:00:00 1 via ORAL
  Administered 2013-09-19 (×2): 2 via ORAL
  Administered 2013-09-19: 1 via ORAL
  Administered 2013-09-20 – 2013-09-22 (×5): 2 via ORAL
  Filled 2013-09-16 (×4): qty 2
  Filled 2013-09-16: qty 1
  Filled 2013-09-16: qty 2
  Filled 2013-09-16: qty 1
  Filled 2013-09-16 (×3): qty 2
  Filled 2013-09-16: qty 1
  Filled 2013-09-16: qty 2

## 2013-09-16 MED ORDER — POLYETHYLENE GLYCOL 3350 17 G PO PACK: 17.0000 g | PACK | Freq: Every day | ORAL | Status: DC | PRN

## 2013-09-16 MED ORDER — LIDOCAINE HCL 1 % IJ SOLN
INTRAMUSCULAR | Status: DC | PRN
  Administered 2013-09-16: 60 mg via INTRADERMAL

## 2013-09-16 MED ORDER — TESTOSTERONE CYPIONATE 100 MG/ML IM SOLN: 250.0000 mg | INTRAMUSCULAR | Status: DC

## 2013-09-16 MED ORDER — KETOROLAC TROMETHAMINE 30 MG/ML IJ SOLN
15.0000 mg | Freq: Once | INTRAMUSCULAR | Status: AC | PRN
  Administered 2013-09-16: 30 mg via INTRAVENOUS

## 2013-09-16 MED ORDER — PROPOFOL 10 MG/ML IV EMUL
INTRAVENOUS | Status: AC | PRN
  Administered 2013-09-16: 6.5 mL via INTRAVENOUS

## 2013-09-16 MED ORDER — HYDROMORPHONE HCL PF 1 MG/ML IJ SOLN
0.5000 mg | INTRAMUSCULAR | Status: DC | PRN
  Administered 2013-09-19: 1 mg via INTRAVENOUS
  Filled 2013-09-16: qty 1

## 2013-09-16 MED ORDER — METOCLOPRAMIDE HCL 5 MG PO TABS
5.0000 mg | ORAL_TABLET | Freq: Three times a day (TID) | ORAL | Status: DC | PRN
  Filled 2013-09-16: qty 2

## 2013-09-16 MED ORDER — METOPROLOL SUCCINATE ER 50 MG PO TB24
150.0000 mg | ORAL_TABLET | Freq: Every evening | ORAL | Status: DC
  Administered 2013-09-17 – 2013-09-21 (×4): 150 mg via ORAL
  Filled 2013-09-16 (×6): qty 1

## 2013-09-16 MED ORDER — PROPOFOL 10 MG/ML IV BOLUS
INTRAVENOUS | Status: AC | PRN
  Administered 2013-09-16: 64.95 mg via INTRAVENOUS

## 2013-09-16 MED ORDER — ASPIRIN EC 81 MG PO TBEC
81.0000 mg | DELAYED_RELEASE_TABLET | Freq: Every day | ORAL | Status: DC
  Administered 2013-09-17 – 2013-09-21 (×5): 81 mg via ORAL
  Filled 2013-09-16 (×10): qty 1

## 2013-09-16 MED ORDER — SODIUM CHLORIDE 0.9 % IV SOLN
INTRAVENOUS | Status: DC
  Administered 2013-09-17 (×2): via INTRAVENOUS
  Filled 2013-09-16 (×6): qty 1000

## 2013-09-16 MED ORDER — SIMVASTATIN 40 MG PO TABS
40.0000 mg | ORAL_TABLET | Freq: Every evening | ORAL | Status: DC
  Administered 2013-09-17 – 2013-09-21 (×4): 40 mg via ORAL
  Filled 2013-09-16 (×6): qty 1

## 2013-09-16 MED ORDER — CLONAZEPAM 1 MG PO TABS
5.0000 mg | ORAL_TABLET | Freq: Two times a day (BID) | ORAL | Status: DC
  Administered 2013-09-17 – 2013-09-19 (×6): 5 mg via ORAL
  Filled 2013-09-16 (×7): qty 5

## 2013-09-16 MED ORDER — HYDROMORPHONE HCL PF 1 MG/ML IJ SOLN
1.0000 mg | INTRAMUSCULAR | Status: DC | PRN
  Administered 2013-09-16: 1 mg via INTRAVENOUS
  Filled 2013-09-16: qty 1

## 2013-09-16 SURGICAL SUPPLY — 1 items: SLING ARM IMMOBILIZER LRG (SOFTGOODS) ×2 IMPLANT

## 2013-09-16 NOTE — H&P (Signed)
[name] is an 57 y.o. male.    Chief Complaint:  Left shoulder dislocation   HPI: Pt is a 57 y.o. male complaining of left shoulder pain and inability to move his arm.  He reports that maybe he stumbled and fell into some furniture early this am.  Brought to ER where radiographs revealed concern for a left  posterior shoulder dislocation.    He reports no other pains or events.  PCP:  Randal Buba, MD  D/C Plans: To be determined following appropriate treatment plan.  Will plan to admit over night for observation and discharge to home in am with sling  PMH: Past Medical History  Diagnosis Date  . Seizures   . Hypertension   . GERD (gastroesophageal reflux disease)   . Dyslipidemia   . Schizoaffective disorder   . Depression   . Anxiety   . MRSA carrier     PSH: Past Surgical History  Procedure Laterality Date  . Self orchectomy    . Colonoscopy  2010  . Orif shoulder fracture      Social History:  reports that he has never smoked. He has never used smokeless tobacco. He reports that he does not drink alcohol or use illicit drugs.  Allergies:  Allergies  Allergen Reactions  . Sulfa Antibiotics Nausea And Vomiting    Medications:  (Not in a hospital admission)  Results for orders placed during the hospital encounter of 09/16/13 (from the past 48 hour(s))  CBC WITH DIFFERENTIAL     Status: Abnormal   Collection Time    09/16/13  4:10 PM      Result Value Range   WBC 15.1 (*) 4.0 - 10.5 K/uL   RBC 4.69  4.22 - 5.81 MIL/uL   Hemoglobin 15.6  13.0 - 17.0 g/dL   HCT 16.1  09.6 - 04.5 %   MCV 93.0  78.0 - 100.0 fL   MCH 33.3  26.0 - 34.0 pg   MCHC 35.8  30.0 - 36.0 g/dL   RDW 40.9  81.1 - 91.4 %   Platelets 244  150 - 400 K/uL   Neutrophils Relative % 90 (*) 43 - 77 %   Neutro Abs 13.6 (*) 1.7 - 7.7 K/uL   Lymphocytes Relative 5 (*) 12 - 46 %   Lymphs Abs 0.8  0.7 - 4.0 K/uL   Monocytes Relative 5  3 - 12 %   Monocytes Absolute 0.7  0.1 - 1.0 K/uL    Eosinophils Relative 0  0 - 5 %   Eosinophils Absolute 0.0  0.0 - 0.7 K/uL   Basophils Relative 0  0 - 1 %   Basophils Absolute 0.0  0.0 - 0.1 K/uL  BASIC METABOLIC PANEL     Status: Abnormal   Collection Time    09/16/13  4:10 PM      Result Value Range   Sodium 131 (*) 135 - 145 mEq/L   Potassium 4.1  3.5 - 5.1 mEq/L   Chloride 96  96 - 112 mEq/L   CO2 24  19 - 32 mEq/L   Glucose, Bld 133 (*) 70 - 99 mg/dL   BUN 17  6 - 23 mg/dL   Creatinine, Ser 7.82  0.50 - 1.35 mg/dL   Calcium 9.6  8.4 - 95.6 mg/dL   GFR calc non Af Amer >90  >90 mL/min   GFR calc Af Amer >90  >90 mL/min   Comment: (NOTE)     The eGFR has  been calculated using the CKD EPI equation.     This calculation has not been validated in all clinical situations.     eGFR's persistently <90 mL/min signify possible Chronic Kidney     Disease.   Dg Shoulder Left  09/16/2013   CLINICAL DATA:  Left shoulder pain, no known injury  EXAM: LEFT SHOULDER - 2+ VIEW  COMPARISON:  None.  FINDINGS: Posterior shoulder dislocation on the Y-view.  Suspected impaction fracture of the greater tuberosity.  Visualized left lung is clear.  IMPRESSION: Posterior shoulder dislocation.  Suspected impaction fracture of the greater tuberosity.   Electronically Signed   By: Charline Bills M.D.   On: 09/16/2013 16:46    ROS: Review of Systems - General ROS: negative for - chills, fatigue or fever Psychological ROS: positive for - depression Respiratory ROS: no cough, shortness of breath, or wheezing Cardiovascular ROS: no chest pain or dyspnea on exertion Gastrointestinal ROS: no abdominal pain, change in bowel habits, or black or bloody stools Musculoskeletal ROS: positive for - significant pain with attempt at left shoulder movement NVI Bilateral upper extrermities  Physican Exam: Blood pressure 155/85, pulse 84, temperature 98.3 F (36.8 C), temperature source Oral, resp. rate 18, weight 86.6 kg (190 lb 14.7 oz), SpO2  93.00%. .physicalexam  Pleasant male with very dry mouth - medication induced Laying in hospital stretcher Chest clear Heart regular Abdomen obese, soft, nontender  Left UE observed to be at his side, any attempt at motion produces significant discomfort NVI distally and around shoulder with regards to sensitivity  No deformity or findings on right upper extremity or his lower extremities  Assessment/Plan Assessment:  Posterior dislocation left shoulder   Plan: Patient will undergo a closed reduction of his left shoulder on 09/16/2013. Risks benefits and expectation were discussed with the patient. Patient understand risks, benefits and expectation and wishes to proceed.  Based on his social situation he has no one at home to care for him.  Given the post-operative state I will admit him for observation and plan to help with discharge arrangements and plans tomorrow   Madlyn Frankel. Charlann Boxer, MD  09/16/2013, 9:45 PM

## 2013-09-16 NOTE — ED Notes (Signed)
MD at bedside. Dr. Ferd Hibbs at bedside.

## 2013-09-16 NOTE — Anesthesia Postprocedure Evaluation (Signed)
  Anesthesia Post-op Note  Anesthesia Post Note  Patient: [name]  Procedure(s) Performed: Procedure(s) (LRB): CLOSED REDUCTION SHOULDER (Left)  Anesthesia type: MAC  Patient location: PACU  Post pain: Pain level controlled  Post assessment: Post-op Vital signs reviewed  Last Vitals:  Filed Vitals:   09/16/13 2300  BP: 156/89  Pulse: 83  Temp: 36.4 C  Resp:     Post vital signs: Reviewed  Level of consciousness: sedated  Complications: No apparent anesthesia complications

## 2013-09-16 NOTE — Transfer of Care (Signed)
Immediate Anesthesia Transfer of Care Note  Patient: [name]  Procedure(s) Performed: Procedure(s): CLOSED REDUCTION SHOULDER (Left)  Patient Location: PACU  Anesthesia Type:General  Level of Consciousness: awake, alert  and oriented  Airway & Oxygen Therapy: Patient Spontanous Breathing and Patient connected to face mask oxygen  Post-op Assessment: Report given to PACU RN and Post -op Vital signs reviewed and stable  Post vital signs: Reviewed and stable  Complications: No apparent anesthesia complications

## 2013-09-16 NOTE — ED Notes (Signed)
Pt signed consent for OR surgical procedure.

## 2013-09-16 NOTE — ED Notes (Signed)
Pt washed with cholorohexadine wipes. Security at bedside for pt's belongings.

## 2013-09-16 NOTE — ED Notes (Signed)
Pt BIB EMS. Pt c/o L shoulder pain. Pt denies injury to L shoulder. Denies pain elsewhere. Pt c/o severe pain with mvmt of L shoulder. Pt was able to ambulate without difficulty per EMS. EMS states pt was lying in bed and had been incontinent of stool. Pt a/o x 4. Pt lives alone. Skin warm and dry. No acute distress.

## 2013-09-16 NOTE — ED Notes (Signed)
Pt has pajamas pants, keys, watch, and a blue shoulder sling in TCU locker 26

## 2013-09-16 NOTE — ED Provider Notes (Signed)
CSN: [id number]     Arrival date & time 09/16/13  1529 History  First MD Initiated Contact with Patient 09/16/13 1543     Chief Complaint  Patient presents with  . Shoulder Pain   HPI Pt started having pain in his shoulder last night.  The pain is in the left shoulder.  He does not recall any specific injuries but he does have a history of seizures and sometimes he does not know if they have occurred.   This morning he woke up with persistent pain.  It has increased in severity.  Movement increases the pain.  No shortness of breath.  No chest pain.  No neck pain.  No numbness or weakness in his hand.  Pt does have a history of a prior surgery on his left shoulder.  Past Medical History  Diagnosis Date  . Seizures   . Hypertension   . GERD (gastroesophageal reflux disease)   . Dyslipidemia   . Schizoaffective disorder   . Depression   . Anxiety   . MRSA carrier    Past Surgical History  Procedure Laterality Date  . Self orchectomy    . Colonoscopy  2010  . Orif shoulder fracture     Family History  Problem Relation Age of Onset  . Stroke Father     Living at 41  . Stroke Mother     Stroke in late 62's. Died in her 38's   History  Substance Use Topics  . Smoking status: Never Smoker   . Smokeless tobacco: Never Used  . Alcohol Use: No    Review of Systems  Constitutional: Negative for fever.  Respiratory: Negative for cough.   Cardiovascular: Negative for chest pain.  Gastrointestinal: Negative for abdominal pain and abdominal distention.  Genitourinary: Negative for dysuria.  Neurological: Negative for headaches.  All other systems reviewed and are negative.    Allergies  Sulfa antibiotics  Home Medications   Current Outpatient Rx  Name  Route  Sig  Dispense  Refill  . ALPRAZolam (XANAX) 1 MG tablet   Oral   Take 1 mg by mouth 3 (three) times daily.          Marland Kitchen aspirin EC 81 MG tablet   Oral   Take 81 mg by mouth at bedtime.          . calcium  carbonate (TUMS - DOSED IN MG ELEMENTAL CALCIUM) 500 MG chewable tablet   Oral   Chew 1 tablet by mouth daily as needed.         . clonazePAM (KLONOPIN) 2 MG tablet   Oral   Take 2.5 tablets (5 mg total) by mouth 2 (two) times daily.   150 tablet   3   . doxepin (SINEQUAN) 150 MG capsule   Oral   Take 900 mg by mouth at bedtime.         Marland Kitchen lisinopril (PRINIVIL,ZESTRIL) 5 MG tablet   Oral   Take 1 tablet (5 mg total) by mouth daily.   90 tablet   3   . metoprolol succinate (TOPROL-XL) 50 MG 24 hr tablet   Oral   Take 3 tablets (150 mg total) by mouth every evening. Take with or immediately following a meal.   90 tablet   3   . omeprazole (PRILOSEC) 20 MG capsule   Oral   Take 20 mg by mouth daily as needed. For heartburn or acid reflux.         Marland Kitchen  phenytoin (DILANTIN) 50 MG tablet   Oral   Chew 4 tablets (200 mg total) by mouth at bedtime.   120 tablet   6   . polyethylene glycol (MIRALAX / GLYCOLAX) packet   Oral   Take 17 g by mouth daily as needed. For constipation. Mix into 8 ounces of fluid & drink   30 each   6   . QUEtiapine (SEROQUEL) 300 MG tablet   Oral   Take 900 mg by mouth at bedtime.          . simvastatin (ZOCOR) 40 MG tablet   Oral   Take 40 mg by mouth every evening.         . testosterone cypionate (DEPOTESTOTERONE CYPIONATE) 100 MG/ML injection   Intramuscular   Inject 2.5 mLs (250 mg total) into the muscle every 14 (fourteen) days.   10 mL   3    BP 123/80  Pulse 84  Temp(Src) 98.3 F (36.8 C) (Oral)  Resp 16  Wt 190 lb 14.7 oz (86.6 kg)  BMI 26.64 kg/m2  SpO2 95% Physical Exam  Nursing note and vitals reviewed. Constitutional: He appears well-developed and well-nourished. No distress.  HENT:  Head: Normocephalic and atraumatic.  Right Ear: External ear normal.  Left Ear: External ear normal.  Eyes: Conjunctivae are normal. Right eye exhibits no discharge. Left eye exhibits no discharge. No scleral icterus.  Neck:  Neck supple. No tracheal deviation present.  Cardiovascular: Normal rate, regular rhythm and intact distal pulses.   Pulmonary/Chest: Effort normal and breath sounds normal. No stridor. No respiratory distress. He has no wheezes. He has no rales.  Abdominal: Soft. Bowel sounds are normal. He exhibits no distension. There is no tenderness. There is no rebound and no guarding.  Musculoskeletal: He exhibits tenderness. He exhibits no edema.       Left shoulder: He exhibits decreased range of motion, tenderness, bony tenderness and pain. He exhibits no swelling, no effusion, no crepitus, no deformity, no spasm, normal pulse and normal strength.  2+ radial pulse bilaterally, pain with range of motion left shoulder, ttp proximal humerus region and ac joint, no effusion, no erythema, no lymphangitic streakin  Neurological: He is alert. He has normal strength. No sensory deficit. Cranial nerve deficit:  no gross defecits noted. He exhibits normal muscle tone. He displays no seizure activity. Coordination normal.  Skin: Skin is warm and dry. No rash noted.  Few abrasions and old bruises lower extrem  Psychiatric: He has a normal mood and affect.    ED Course  Reduction of dislocation Date/Time: 09/16/2013 7:34 PM Performed by: Linwood Dibbles R Authorized by: Linwood Dibbles R Consent: Verbal consent obtained. written consent obtained. Patient identity confirmed: verbally with patient Local anesthesia used: no Patient sedated: yes Sedation type: moderate (conscious) sedation Sedatives: see MAR for details and propofol Sedation end date/time: 09/16/2013 7:34 PM Vitals: Vital signs were monitored during sedation. Patient tolerance: Patient tolerated the procedure well with no immediate complications. Comments: Only Light sedation achieved, unsuccessful attempt at reduction with traction counter traction and internal and external rotation   (including critical care time) Labs Review Labs Reviewed  CBC WITH  DIFFERENTIAL - Abnormal; Notable for the following:    WBC 15.1 (*)    Neutrophils Relative % 90 (*)    Neutro Abs 13.6 (*)    Lymphocytes Relative 5 (*)    All other components within normal limits  BASIC METABOLIC PANEL - Abnormal; Notable for the following:  Sodium 131 (*)    Glucose, Bld 133 (*)    All other components within normal limits   Imaging Review Dg Shoulder Left  09/16/2013   CLINICAL DATA:  Left shoulder pain, no known injury  EXAM: LEFT SHOULDER - 2+ VIEW  COMPARISON:  None.  FINDINGS: Posterior shoulder dislocation on the Y-view.  Suspected impaction fracture of the greater tuberosity.  Visualized left lung is clear.  IMPRESSION: Posterior shoulder dislocation.  Suspected impaction fracture of the greater tuberosity.   Electronically Signed   By: Charline Bills M.D.   On: 09/16/2013 16:46   1742 : Xray shows a posterior shoulder dislocation.  Pt is still not exactly sure how this occurred.  He denies any trauma. 1610 Discussed with Dr Charlann Boxer.  Will review films.  May need OR reduction. MDM   1. Posterior dislocation of shoulder joint, left, initial encounter     I was unsuccessful in my attempt at a reduction in the emergency department. I have consulted with Dr.Olin Regarding further treatment.    Celene Kras, MD 09/16/13 [phone]

## 2013-09-16 NOTE — Anesthesia Preprocedure Evaluation (Addendum)
Anesthesia Evaluation  Patient identified by MRN, date of birth, ID band Patient awake    Reviewed: Allergy & Precautions, H&P , NPO status , Patient's Chart, lab work & pertinent test results, reviewed documented beta blocker date and time   History of Anesthesia Complications Negative for: history of anesthetic complications  Airway Mallampati: III TM Distance: >3 FB Neck ROM: full    Dental  (+) Poor Dentition   Pulmonary neg pulmonary ROS,  breath sounds clear to auscultation  Pulmonary exam normal       Cardiovascular hypertension, On Home Beta Blockers Rhythm:regular Rate:Normal  Hyperlipidemia Echo 2/13 - EF 35-40% Nuc Med stress test 2/13 - normal, EF 58%   Neuro/Psych Seizures -,  PSYCHIATRIC DISORDERS (depression, schizoaffective d/o, anxiety)    GI/Hepatic Neg liver ROS, GERD-  Medicated,  Endo/Other  negative endocrine ROS  Renal/GU negative Renal ROS  negative genitourinary   Musculoskeletal   Abdominal   Peds  Hematology negative hematology ROS (+)   Anesthesia Other Findings   Reproductive/Obstetrics negative OB ROS                           Anesthesia Physical Anesthesia Plan  ASA: III  Anesthesia Plan: MAC and General LMA   Post-op Pain Management:    Induction:   Airway Management Planned:   Additional Equipment:   Intra-op Plan:   Post-operative Plan:   Informed Consent: I have reviewed the patients History and Physical, chart, labs and discussed the procedure including the risks, benefits and alternatives for the proposed anesthesia with the patient or authorized representative who has indicated his/her understanding and acceptance.   Dental Advisory Given  Plan Discussed with: CRNA and Surgeon  Anesthesia Plan Comments:         Anesthesia Quick Evaluation

## 2013-09-16 NOTE — ED Notes (Signed)
Pt states he last ate yesterday. Last drank yesterday.

## 2013-09-16 NOTE — Preoperative (Signed)
Beta Blockers   Reason not to administer Beta Blockers:Not Applicable, Will monitor and give beta blocker as necessary.

## 2013-09-16 NOTE — Brief Op Note (Signed)
09/16/2013  10:29 PM  PATIENT:  JonBarry Passey  57 y.o. male  PRE-OPERATIVE DIAGNOSIS:  left posterior shoulder dislocation  POST-OPERATIVE DIAGNOSIS:  left posterior shoulder dislocation  PROCEDURE:  Procedure(s): CLOSED REDUCTION SHOULDER (Left)  SURGEON:  Surgeon(s) and Role:    * Shelda Pal, MD - Primary    ASSISTANTS: none   ANESTHESIA:   general  EBL:     BLOOD ADMINISTERED:none  DRAINS: none   LOCAL MEDICATIONS USED:  NONE  SPECIMEN:  No Specimen  DISPOSITION OF SPECIMEN:  N/A  COUNTS:  YES  TOURNIQUET:  * No tourniquets in log *  DICTATION: .Other Dictation: Dictation Number [id number]  PLAN OF CARE: Admit to inpatient   PATIENT DISPOSITION:  PACU - hemodynamically stable.   Delay start of Pharmacological VTE agent (>24hrs) due to surgical blood loss or risk of bleeding: [id number]

## 2013-09-17 ENCOUNTER — Encounter (HOSPITAL_COMMUNITY): Payer: Self-pay | Admitting: *Deleted

## 2013-09-17 ENCOUNTER — Telehealth: Payer: Self-pay | Admitting: *Deleted

## 2013-09-17 ENCOUNTER — Inpatient Hospital Stay (HOSPITAL_COMMUNITY): Payer: Medicaid Other

## 2013-09-17 LAB — COMPREHENSIVE METABOLIC PANEL
ALT: 19 U/L (ref 0–53)
AST: 25 U/L (ref 0–37)
Albumin: 3.3 g/dL — ABNORMAL LOW (ref 3.5–5.2)
Alkaline Phosphatase: 85 U/L (ref 39–117)
BUN: 19 mg/dL (ref 6–23)
CO2: 23 mEq/L (ref 19–32)
Calcium: 8.3 mg/dL — ABNORMAL LOW (ref 8.4–10.5)
Chloride: 101 mEq/L (ref 96–112)
Creatinine, Ser: 0.98 mg/dL (ref 0.50–1.35)
GFR calc Af Amer: >90 mL/min (ref >90)
GFR calc non Af Amer: 88 mL/min — ABNORMAL LOW (ref >90)
Glucose, Bld: 117 mg/dL — ABNORMAL HIGH (ref 70–99)
Potassium: 3.8 mEq/L (ref 3.5–5.1)
Sodium: 133 mEq/L — ABNORMAL LOW (ref 135–145)
Total Bilirubin: 0.4 mg/dL (ref 0.3–1.2)
Total Protein: 6.1 g/dL (ref 6.0–8.3)

## 2013-09-17 LAB — TYPE AND SCREEN
ABO/RH(D): A POS
Antibody Screen: NEGATIVE

## 2013-09-17 LAB — PHENYTOIN LEVEL, TOTAL: Phenytoin Lvl: 10 ug/mL (ref 10.0–20.0)

## 2013-09-17 MED ORDER — CHLORHEXIDINE GLUCONATE 4 % EX LIQD
60.0000 mL | Freq: Once | CUTANEOUS | Status: AC
  Administered 2013-09-18: 4 via TOPICAL
  Filled 2013-09-17 (×2): qty 60

## 2013-09-17 MED ORDER — ONDANSETRON HCL 4 MG/2ML IJ SOLN: 4.0000 mg | Freq: Four times a day (QID) | INTRAMUSCULAR | Status: DC | PRN

## 2013-09-17 MED ORDER — HYDROCODONE-ACETAMINOPHEN 5-325 MG PO TABS: 1.0000 | ORAL_TABLET | ORAL | Status: DC | PRN

## 2013-09-17 MED ORDER — CEFAZOLIN SODIUM-DEXTROSE 2-3 GM-% IV SOLR
2.0000 g | INTRAVENOUS | Status: AC
  Administered 2013-09-18: 2 g via INTRAVENOUS
  Filled 2013-09-17: qty 50

## 2013-09-17 MED ORDER — HYDROMORPHONE HCL PF 1 MG/ML IJ SOLN: 0.5000 mg | INTRAMUSCULAR | Status: DC | PRN

## 2013-09-17 NOTE — Op Note (Signed)
NAMECHASIN, FINDLING             ACCOUNT NO.:  [id number]  MEDICAL RECORD NO.:  [id number]  LOCATION:  1603                         FACILITY:  [facility]  PHYSICIAN:  Madlyn Frankel. Charlann Boxer, M.D.  DATE OF BIRTH:  10/23/1956  DATE OF PROCEDURE:  09/16/2013 DATE OF DISCHARGE:                              OPERATIVE REPORT   PREOPERATIVE DIAGNOSIS:  Posterior left shoulder dislocation by presentation and radiographs.  POSTOPERATIVE DIAGNOSIS:  Posterior shoulder fracture dislocation.  PROCEDURE:  Closed reduction of left shoulder.  SURGEON:  Madlyn Frankel. Charlann Boxer, MD  ASSISTANT:  Surgical team.  ANESTHESIA:  General.  SPECIMENS:  None.  COMPLICATIONS:  None apparent.  INDICATIONS FOR PROCEDURE:  Mr. Eskew is a 57 year old male, who presented to the emergency room after he recognized inability to use the left arm with significant amount of pain.  He reported to me that he was awoken this morning and perhaps lost his balance.  He was seen and evaluated in the emergency room initially with radiographs that revealed a posterior dislocation by axillary lateral.  After an attempt to perform conscious sedation in the emergency room failed to provide adequate sedation, the plan was to proceed to the operating room.  I had a chance to review with him the risks and benefits and necessity of trying to get his shoulder reduced for pain control.  No further imaging was done prior to the operative procedure or after the attempted reduction in the emergency room.  PROCEDURE IN DETAIL:  The patient was brought to operative theater. Once adequate anesthesia was established, a time-out was performed identifying the patient, planned procedure, and extremity.  Once adequately sedated, the patient's arm was gently adducted, internally rotated with some lateral pressure.  With this I was able to palpably feel a reduction of the shoulder.  It did not appear to be an issue with the reduction.  Once the reduction  was done, I did have imaging in the room and axillary lateral was attempted, but not possible just based on positioning and anesthetic concerns.  I then just did an AP of the shoulder and scapula which revealed a fracture to the patient's humeral Head confirming some of my initial suspicion that there may have been more to this injury.   There was evidence on the preoperative films of a impaction type injury to the glenoid tuberosity, but nothing was identified.  A CT scan had not been done preoperatively.  The patient's arm was placed into a sling.  He was woken from anesthesia and taken to recovery room  At this point, I am going to admit him overnight based on social reasons and lack of social support.  I am going to get a CT scan of his left shoulder to better identify the nature of his injury and will contact one of my partners to manage his shoulder issues on a more regular basis for an attempted repair versus other types of procedures.     Madlyn Frankel Charlann Boxer, M.D.     MDO/MEDQ  D:  09/16/2013  T:  09/17/2013  Job:  [id number]

## 2013-09-17 NOTE — Care Management Note (Addendum)
    Page 1 of 2   09/21/2013     2:31:22 PM   CARE MANAGEMENT NOTE 09/21/2013  Patient:  JEREMIAS, BROYHILL   Account Number:  [id number]  Date Initiated:  09/17/2013  Documentation initiated by:  Colleen Can  Subjective/Objective Assessment:   dx fracture/dislocation left shoulder     Action/Plan:   PT FROM HOME.  LIVES ALONE    PLANS ARE FOR PT TO TRANSFER TO [facility] WHERE HE WILL HAVE SURGERY.   Anticipated DC Date:  09/21/2013   Anticipated DC Plan:  HOME W HOME HEALTH SERVICES      DC Planning Services  CM consult      Choice offered to / List presented to:  C-1 Patient      DME agency  [facility]     [facility] arranged  HH-1 RN  HH-2 PT  HH-3 OT  HH-4 NURSE'S AIDE  HH-6 SOCIAL WORKER      HH agency  [facility]   Status of service:  Completed, signed off Medicare Important Message given?   (If response is "NO", the following Medicare IM given date fields will be blank) Date Medicare IM given:   Date Additional Medicare IM given:    Discharge Disposition:    Per UR Regulation:  Reviewed for med. necessity/level of care/duration of stay  If discussed at Long Length of Stay Meetings, dates discussed:    Comments:  09-21-13 Confirmed facesheet information with patient . Ordered rolling walker with platform on the left . Ronny Flurry RN BSN [phone]

## 2013-09-17 NOTE — Progress Notes (Signed)
Report called to b gibbs RN... Dfranklin RN

## 2013-09-17 NOTE — Progress Notes (Signed)
Patient ID: [name], male   DOB: 1956/08/23, 57 y.o.   MRN: [id number] Subjective: 1 Day Post-Op Procedure(s) (LRB): CLOSED REDUCTION SHOULDER (Left)    Patient sleeping soundly this am which is typical for him with his medications No reports of problems or events last night  Objective:   VITALS:   Filed Vitals:   09/17/13 0437  BP: 104/67  Pulse: 71  Temp: 98 F (36.7 C)  Resp: 20    Exam Left upper extremity in sling Other wise I did not wake him due to the night we had  LABS  Recent Labs  09/16/13 1610  HGB 15.6  HCT 43.6  WBC 15.1*  PLT 244     Recent Labs  09/16/13 1610 09/17/13 0432  NA 131* 133*  K 4.1 3.8  BUN 17 19  CREATININE 0.84 0.98  GLUCOSE 133* 117*    No results found for this basename: LABPT, INR,  in the last 72 hours   Assessment/Plan: 1 Day Post-Op Procedure(s) (LRB): CLOSED REDUCTION SHOULDER (Left)   {Plan: Ordered CT scan to better evaluate the fracture dislocation of this shoulder  Will ask for assistance in definitive management of the shoulder today Otherwise activity as tolerated with sling immobilization of his left upper extremity

## 2013-09-17 NOTE — Telephone Encounter (Signed)
[facility] calling for NPI #, patient seen in ED last night for left shoulder dislocation, having surgery tomorrow, [facility] states they will most likely be following patient for the next 90 days. NPI # given.

## 2013-09-18 ENCOUNTER — Inpatient Hospital Stay (HOSPITAL_COMMUNITY): Payer: Medicaid Other

## 2013-09-18 ENCOUNTER — Inpatient Hospital Stay (HOSPITAL_COMMUNITY): Payer: Medicaid Other | Admitting: Anesthesiology

## 2013-09-18 ENCOUNTER — Encounter (HOSPITAL_COMMUNITY): Payer: Self-pay | Admitting: Anesthesiology

## 2013-09-18 ENCOUNTER — Encounter (HOSPITAL_COMMUNITY)
Admission: EM | Disposition: A | Discharge status: Home / Self Care | Payer: Self-pay | Source: Home / Self Care | Attending: Orthopedic Surgery

## 2013-09-18 HISTORY — PX: SHOULDER HEMI-ARTHROPLASTY: SHX5049

## 2013-09-18 LAB — SURGICAL PCR SCREEN
MRSA, PCR: NEGATIVE
Staphylococcus aureus: NEGATIVE

## 2013-09-18 SURGERY — HEMIARTHROPLASTY, SHOULDER
Anesthesia: Regional | Site: Shoulder | Laterality: Left | Wound class: Clean

## 2013-09-18 MED ORDER — BUPIVACAINE-EPINEPHRINE PF 0.25-1:200000 % IJ SOLN
INTRAMUSCULAR | Status: AC
  Filled 2013-09-18: qty 30

## 2013-09-18 MED ORDER — PROPOFOL 10 MG/ML IV BOLUS
INTRAVENOUS | Status: DC | PRN
  Administered 2013-09-18: 120 mg via INTRAVENOUS

## 2013-09-18 MED ORDER — LIDOCAINE HCL (CARDIAC) 20 MG/ML IV SOLN
INTRAVENOUS | Status: DC | PRN
  Administered 2013-09-18: 40 mg via INTRAVENOUS

## 2013-09-18 MED ORDER — ACETAMINOPHEN 650 MG RE SUPP: 650.0000 mg | Freq: Four times a day (QID) | RECTAL | Status: DC | PRN

## 2013-09-18 MED ORDER — CEFAZOLIN SODIUM-DEXTROSE 2-3 GM-% IV SOLR
INTRAVENOUS | Status: AC
  Administered 2013-09-18: 2 g via INTRAVENOUS
  Filled 2013-09-18: qty 50

## 2013-09-18 MED ORDER — MENTHOL 3 MG MT LOZG: 1.0000 | LOZENGE | OROMUCOSAL | Status: DC | PRN

## 2013-09-18 MED ORDER — PHENYLEPHRINE HCL 10 MG/ML IJ SOLN
INTRAMUSCULAR | Status: DC | PRN
  Administered 2013-09-18: 160 ug via INTRAVENOUS
  Administered 2013-09-18 (×2): 120 ug via INTRAVENOUS

## 2013-09-18 MED ORDER — SUCCINYLCHOLINE CHLORIDE 20 MG/ML IJ SOLN
INTRAMUSCULAR | Status: DC | PRN
  Administered 2013-09-18: 100 mg via INTRAVENOUS

## 2013-09-18 MED ORDER — PHENYLEPHRINE HCL 10 MG/ML IJ SOLN
10.0000 mg | INTRAVENOUS | Status: DC | PRN
  Administered 2013-09-18: 50 ug/min via INTRAVENOUS

## 2013-09-18 MED ORDER — BUPIVACAINE-EPINEPHRINE PF 0.25-1:200000 % IJ SOLN
INTRAMUSCULAR | Status: DC | PRN
  Administered 2013-09-18: 8 mL

## 2013-09-18 MED ORDER — ALBUTEROL SULFATE HFA 108 (90 BASE) MCG/ACT IN AERS
1.0000 | INHALATION_SPRAY | Freq: Once | RESPIRATORY_TRACT | Status: DC
  Filled 2013-09-18: qty 6.7

## 2013-09-18 MED ORDER — PHENOL 1.4 % MT LIQD: 1.0000 | OROMUCOSAL | Status: DC | PRN

## 2013-09-18 MED ORDER — METOCLOPRAMIDE HCL 5 MG/ML IJ SOLN: 5.0000 mg | Freq: Three times a day (TID) | INTRAMUSCULAR | Status: DC | PRN

## 2013-09-18 MED ORDER — ALPRAZOLAM 0.5 MG PO TABS
1.0000 mg | ORAL_TABLET | Freq: Three times a day (TID) | ORAL | Status: DC | PRN
  Administered 2013-09-18 – 2013-09-19 (×2): 1 mg via ORAL
  Filled 2013-09-18 (×2): qty 2

## 2013-09-18 MED ORDER — OXYCODONE HCL 5 MG/5ML PO SOLN: 5.0000 mg | Freq: Once | ORAL | Status: DC | PRN

## 2013-09-18 MED ORDER — ACETAMINOPHEN 325 MG PO TABS: 650.0000 mg | ORAL_TABLET | Freq: Four times a day (QID) | ORAL | Status: DC | PRN

## 2013-09-18 MED ORDER — METHOCARBAMOL 500 MG PO TABS: 500.0000 mg | ORAL_TABLET | Freq: Four times a day (QID) | ORAL | Status: DC | PRN

## 2013-09-18 MED ORDER — ONDANSETRON HCL 4 MG/2ML IJ SOLN: 4.0000 mg | Freq: Four times a day (QID) | INTRAMUSCULAR | Status: DC | PRN

## 2013-09-18 MED ORDER — IPRATROPIUM BROMIDE HFA 17 MCG/ACT IN AERS
2.0000 | INHALATION_SPRAY | RESPIRATORY_TRACT | Status: DC
  Filled 2013-09-18: qty 12.9

## 2013-09-18 MED ORDER — METOCLOPRAMIDE HCL 5 MG PO TABS: 5.0000 mg | ORAL_TABLET | Freq: Three times a day (TID) | ORAL | Status: DC | PRN

## 2013-09-18 MED ORDER — ARTIFICIAL TEARS OP OINT
TOPICAL_OINTMENT | OPHTHALMIC | Status: DC | PRN
  Administered 2013-09-18: 1 via OPHTHALMIC

## 2013-09-18 MED ORDER — SODIUM CHLORIDE 0.9 % IR SOLN
Status: DC | PRN
  Administered 2013-09-18: 1000 mL

## 2013-09-18 MED ORDER — BUPIVACAINE-EPINEPHRINE PF 0.5-1:200000 % IJ SOLN
INTRAMUSCULAR | Status: DC | PRN
  Administered 2013-09-18: 30 mL

## 2013-09-18 MED ORDER — MIDAZOLAM HCL 2 MG/2ML IJ SOLN
INTRAMUSCULAR | Status: AC
  Filled 2013-09-18: qty 2

## 2013-09-18 MED ORDER — DEXAMETHASONE SODIUM PHOSPHATE 4 MG/ML IJ SOLN
INTRAMUSCULAR | Status: DC | PRN
  Administered 2013-09-18: 4 mg via INTRAVENOUS

## 2013-09-18 MED ORDER — OXYCODONE HCL 5 MG PO TABS: 5.0000 mg | ORAL_TABLET | Freq: Once | ORAL | Status: DC | PRN

## 2013-09-18 MED ORDER — FENTANYL CITRATE 0.05 MG/ML IJ SOLN
INTRAMUSCULAR | Status: AC
  Filled 2013-09-18: qty 2

## 2013-09-18 MED ORDER — EPHEDRINE SULFATE 50 MG/ML IJ SOLN
INTRAMUSCULAR | Status: DC | PRN
  Administered 2013-09-18 (×2): 10 mg via INTRAVENOUS
  Administered 2013-09-18 (×2): 15 mg via INTRAVENOUS

## 2013-09-18 MED ORDER — ONDANSETRON HCL 4 MG PO TABS: 4.0000 mg | ORAL_TABLET | Freq: Four times a day (QID) | ORAL | Status: DC | PRN

## 2013-09-18 MED ORDER — CEFAZOLIN SODIUM-DEXTROSE 2-3 GM-% IV SOLR
2.0000 g | Freq: Four times a day (QID) | INTRAVENOUS | Status: AC
Start: 2013-09-19 — End: 2013-09-19
  Administered 2013-09-18 – 2013-09-19 (×3): 2 g via INTRAVENOUS
  Filled 2013-09-18 (×3): qty 50

## 2013-09-18 MED ORDER — HYDROMORPHONE HCL PF 1 MG/ML IJ SOLN: 0.2500 mg | INTRAMUSCULAR | Status: DC | PRN

## 2013-09-18 MED ORDER — ONDANSETRON HCL 4 MG/2ML IJ SOLN
INTRAMUSCULAR | Status: DC | PRN
  Administered 2013-09-18: 4 mg via INTRAVENOUS

## 2013-09-18 MED ORDER — METHOCARBAMOL 100 MG/ML IJ SOLN
500.0000 mg | Freq: Four times a day (QID) | INTRAVENOUS | Status: DC | PRN
  Filled 2013-09-18: qty 5

## 2013-09-18 MED ORDER — LACTATED RINGERS IV SOLN
INTRAVENOUS | Status: DC
  Administered 2013-09-18 – 2013-09-19 (×4): via INTRAVENOUS

## 2013-09-18 SURGICAL SUPPLY — 56 items
BLADE SAW SAG 73X25 THK (BLADE)
BLADE SAW SGTL 73X25 THK (BLADE) IMPLANT
CEMENT BONE DEPUY (Cement) ×4 IMPLANT
CLOTH BEACON ORANGE TIMEOUT ST (SAFETY) ×2 IMPLANT
COVER SURGICAL LIGHT HANDLE (MISCELLANEOUS) ×2 IMPLANT
DRAPE INCISE IOBAN 66X45 STRL (DRAPES) ×2 IMPLANT
DRAPE U-SHAPE 47X51 STRL (DRAPES) ×2 IMPLANT
DRILL BIT 5/64 (BIT) ×2 IMPLANT
DRSG ADAPTIC 3X8 NADH LF (GAUZE/BANDAGES/DRESSINGS) ×2 IMPLANT
DRSG PAD ABDOMINAL 8X10 ST (GAUZE/BANDAGES/DRESSINGS) ×2 IMPLANT
DURAPREP 26ML APPLICATOR (WOUND CARE) ×2 IMPLANT
ELECT BLADE 4.0 EZ CLEAN MEGAD (MISCELLANEOUS) ×2
ELECT NEEDLE TIP 2.8 STRL (NEEDLE) ×2 IMPLANT
ELECT REM PT RETURN 9FT ADLT (ELECTROSURGICAL) ×2
ELECTRODE BLDE 4.0 EZ CLN MEGD (MISCELLANEOUS) ×1 IMPLANT
ELECTRODE REM PT RTRN 9FT ADLT (ELECTROSURGICAL) ×1 IMPLANT
EPIPHYSIS BODY POROCOAT SZ10 (Orthopedic Implant) ×2 IMPLANT
EPIPHYSIS SHOULD BODYSIZE 10-5 (Knees) ×2 IMPLANT
GLOVE BIOGEL PI ORTHO PRO 7.5 (GLOVE) ×1
GLOVE BIOGEL PI ORTHO PRO SZ8 (GLOVE) ×1
GLOVE ORTHO TXT STRL SZ7.5 (GLOVE) ×2 IMPLANT
GLOVE PI ORTHO PRO STRL 7.5 (GLOVE) ×1 IMPLANT
GLOVE PI ORTHO PRO STRL SZ8 (GLOVE) ×1 IMPLANT
GLOVE SURG ORTHO 8.5 STRL (GLOVE) ×2 IMPLANT
GOWN STRL REIN XL XLG (GOWN DISPOSABLE) ×4 IMPLANT
KIT BASIN OR (CUSTOM PROCEDURE TRAY) ×2 IMPLANT
KIT ROOM TURNOVER OR (KITS) ×2 IMPLANT
MANIFOLD NEPTUNE II (INSTRUMENTS) ×2 IMPLANT
NDL SUT .5 MAYO 1.404X.05X (NEEDLE) ×1 IMPLANT
NEEDLE HYPO 25GX1X1/2 BEV (NEEDLE) ×2 IMPLANT
NEEDLE MAYO TAPER (NEEDLE) ×1
NS IRRIG 1000ML POUR BTL (IV SOLUTION) ×2 IMPLANT
PACK SHOULDER (CUSTOM PROCEDURE TRAY) ×2 IMPLANT
PAD ARMBOARD 7.5X6 YLW CONV (MISCELLANEOUS) ×4 IMPLANT
SLING ARM IMMOBILIZER LRG (SOFTGOODS) ×2 IMPLANT
SLING ARM IMMOBILIZER MED (SOFTGOODS) IMPLANT
SPONGE GAUZE 4X4 12PLY (GAUZE/BANDAGES/DRESSINGS) ×2 IMPLANT
SPONGE LAP 18X18 X RAY DECT (DISPOSABLE) ×2 IMPLANT
STAPLER VISISTAT 35W (STAPLE) ×2 IMPLANT
STEM STANDARD SZ 10 113MM (Stem) ×2 IMPLANT
STEM STD SZ 10 113MM (Stem) ×1 IMPLANT
STRIP CLOSURE SKIN 1/2X4 (GAUZE/BANDAGES/DRESSINGS) IMPLANT
SUCTION FRAZIER TIP 10 FR DISP (SUCTIONS) ×2 IMPLANT
SUT FIBERWIRE #2 38 T-5 BLUE (SUTURE) ×18
SUT MNCRL AB 4-0 PS2 18 (SUTURE) ×2 IMPLANT
SUT VIC AB 0 CT1 27 (SUTURE) ×1
SUT VIC AB 0 CT1 27XBRD ANBCTR (SUTURE) ×1 IMPLANT
SUT VIC AB 2-0 CT1 27 (SUTURE) ×1
SUT VIC AB 2-0 CT1 TAPERPNT 27 (SUTURE) ×1 IMPLANT
SUTURE FIBERWR #2 38 T-5 BLUE (SUTURE) ×9 IMPLANT
SYR CONTROL 10ML LL (SYRINGE) ×2 IMPLANT
TOWEL OR 17X24 6PK STRL BLUE (TOWEL DISPOSABLE) ×2 IMPLANT
TOWEL OR 17X26 10 PK STRL BLUE (TOWEL DISPOSABLE) ×2 IMPLANT
TOWER CARTRIDGE SMART MIX (DISPOSABLE) ×2 IMPLANT
TRAY FOLEY CATH 16FRSI W/METER (SET/KITS/TRAYS/PACK) IMPLANT
WATER STERILE IRR 1000ML POUR (IV SOLUTION) ×2 IMPLANT

## 2013-09-18 NOTE — Preoperative (Signed)
Beta Blockers   Reason not to administer Beta Blockers:Not Applicable 

## 2013-09-18 NOTE — Anesthesia Preprocedure Evaluation (Addendum)
Anesthesia Evaluation  Patient identified by MRN, date of birth, ID band Patient awake    Reviewed: Allergy & Precautions, H&P , NPO status , Patient's Chart, lab work & pertinent test results, reviewed documented beta blocker date and time   Airway Mallampati: III TM Distance: >3 FB Neck ROM: Full    Dental no notable dental hx. (+) Teeth Intact, Dental Advisory Given, Chipped and Poor Dentition   Pulmonary neg pulmonary ROS,  breath sounds clear to auscultation  Pulmonary exam normal       Cardiovascular hypertension, On Medications and On Home Beta Blockers Rhythm:Regular Rate:Normal     Neuro/Psych Seizures -, Well Controlled,  PSYCHIATRIC DISORDERS Anxiety Depression    GI/Hepatic Neg liver ROS, GERD-  Medicated and Controlled,  Endo/Other  negative endocrine ROS  Renal/GU negative Renal ROS  negative genitourinary   Musculoskeletal   Abdominal   Peds  Hematology negative hematology ROS (+)   Anesthesia Other Findings   Reproductive/Obstetrics negative OB ROS                          Anesthesia Physical Anesthesia Plan  ASA: III  Anesthesia Plan: General and Regional   Post-op Pain Management:    Induction: Intravenous  Airway Management Planned: Oral ETT and Video Laryngoscope Planned  Additional Equipment:   Intra-op Plan:   Post-operative Plan: Extubation in OR  Informed Consent: I have reviewed the patients History and Physical, chart, labs and discussed the procedure including the risks, benefits and alternatives for the proposed anesthesia with the patient or authorized representative who has indicated his/her understanding and acceptance.   Dental advisory given  Plan Discussed with: CRNA  Anesthesia Plan Comments:         Anesthesia Quick Evaluation

## 2013-09-18 NOTE — Anesthesia Procedure Notes (Addendum)
Anesthesia Regional Block:  Interscalene brachial plexus block  Pre-Anesthetic Checklist: ,, timeout performed, Correct Patient, Correct Site, Correct Laterality, Correct Procedure, Correct Position, site marked, Risks and benefits discussed, pre-op evaluation,  At surgeon's request and post-op pain management  Laterality: Left  Prep: Maximum Sterile Barrier Precautions used and chloraprep       Needles:  Injection technique: Single-shot  Needle Type: Echogenic Stimulator Needle      Needle Gauge: 22 and 22 G    Additional Needles:  Procedures: ultrasound guided (picture in chart) and nerve stimulator Interscalene brachial plexus block  Nerve Stimulator or Paresthesia:  Response: Biceps response, 0.4 mA,   Additional Responses:   Narrative:  Start time: 09/18/2013 4:40 PM End time: 09/18/2013 4:50 PM Injection made incrementally with aspirations every 5 mL. Anesthesiologist: Sampson Goon, MD  Additional Notes: 2% Lidocaine skin wheel.   Interscalene brachial plexus block Procedure Name: Intubation Date/Time: 09/18/2013 5:05 PM Performed by: Jefm Miles E Pre-anesthesia Checklist: Patient identified, Emergency Drugs available, Suction available, Patient being monitored and Timeout performed Patient Re-evaluated:Patient Re-evaluated prior to inductionOxygen Delivery Method: Circle system utilized Preoxygenation: Pre-oxygenation with 100% oxygen Intubation Type: IV induction Ventilation: Mask ventilation without difficulty and Oral airway inserted - appropriate to patient size Laryngoscope Size: Mac Tube type: Oral Tube size: 7.5 mm Number of attempts: 1 Airway Equipment and Method: Stylet and Video-laryngoscopy Placement Confirmation: ETT inserted through vocal cords under direct vision,  positive ETCO2 and breath sounds checked- equal and bilateral Secured at: 22 cm Tube secured with: Tape Dental Injury: Teeth and Oropharynx as per pre-operative assessment   Difficulty Due To: Difficulty was anticipated and Difficult Airway- due to large tongue Future Recommendations: Recommend- induction with short-acting agent, and alternative techniques readily available

## 2013-09-18 NOTE — Anesthesia Postprocedure Evaluation (Signed)
Anesthesia Post Note  Patient: [name]  Procedure(s) Performed: Procedure(s) (LRB): LEFT SHOULDER HEMI-ARTHROPLASTY (Left)  Anesthesia type: General  Patient location: PACU  Post pain: Pain level controlled  Post assessment: Patient's Cardiovascular Status Stable  Last Vitals:  Filed Vitals:   09/18/13 2050  BP: 129/50  Pulse: 82  Temp: 37 C  Resp:     Post vital signs: Reviewed and stable  Level of consciousness: alert  Complications: No apparent anesthesia complications

## 2013-09-18 NOTE — Transfer of Care (Signed)
Immediate Anesthesia Transfer of Care Note  Patient: [name]  Procedure(s) Performed: Procedure(s): LEFT SHOULDER HEMI-ARTHROPLASTY (Left)  Patient Location: PACU  Anesthesia Type:General and GA combined with regional for post-op pain  Level of Consciousness: awake, alert  and confused  Airway & Oxygen Therapy: Patient Spontanous Breathing and Patient connected to nasal cannula oxygen  Post-op Assessment: Report given to PACU RN, Post -op Vital signs reviewed and stable and Patient moving all extremities X 4  Post vital signs: Reviewed and stable  Complications: No apparent anesthesia complications

## 2013-09-18 NOTE — Brief Op Note (Signed)
09/16/2013 - 09/18/2013  7:28 PM  PATIENT:  [name]  57 y.o. male  PRE-OPERATIVE DIAGNOSIS:  Left Proximal Humerus Fracture, 4 part head splitting fx  POST-OPERATIVE DIAGNOSIS:  left proximal humerus fracture, 4 part head split fx  PROCEDURE:  Procedure(s): LEFT SHOULDER HEMI-ARTHROPLASTY (Left), [employer] 48x18 head, 10 stem  SURGEON:  Surgeon(s) and Role:    * Verlee Rossetti, MD - Primary  PHYSICIAN ASSISTANT:   ASSISTANTS: Thea Gist, PA-C   ANESTHESIA:   regional and general  EBL:     BLOOD ADMINISTERED:none  DRAINS: none   LOCAL MEDICATIONS USED:  NONE  SPECIMEN:  No Specimen  DISPOSITION OF SPECIMEN:  N/A  COUNTS:  YES  TOURNIQUET:  * No tourniquets in log *  DICTATION: .Other Dictation: Dictation Number [id number]  PLAN OF CARE: Admit to inpatient   PATIENT DISPOSITION:  PACU - hemodynamically stable.   Delay start of Pharmacological VTE agent (>24hrs) due to surgical blood loss or risk of bleeding: no

## 2013-09-18 NOTE — Interval H&P Note (Signed)
History and Physical Interval Note:  09/18/2013 4:30 PM  [name]  has presented today for surgery, with the diagnosis of Left Proximal Humerus Fracture  The various methods of treatment have been discussed with the patient and family. After consideration of risks, benefits and other options for treatment, the patient has consented to  Procedure(s): LEFT SHOULDER HEMI-ARTHROPLASTY (Left) as a surgical intervention .  The patient's history has been reviewed, patient examined, no change in status, stable for surgery.  I have reviewed the patient's chart and labs.  Questions were answered to the patient's satisfaction.     Chasen Mendell,STEVEN R

## 2013-09-19 LAB — COMPREHENSIVE METABOLIC PANEL
ALT: 17 U/L (ref 0–53)
AST: 26 U/L (ref 0–37)
Albumin: 3 g/dL — ABNORMAL LOW (ref 3.5–5.2)
Alkaline Phosphatase: 80 U/L (ref 39–117)
BUN: 9 mg/dL (ref 6–23)
CO2: 25 mEq/L (ref 19–32)
Calcium: 8.5 mg/dL (ref 8.4–10.5)
Chloride: 103 mEq/L (ref 96–112)
Creatinine, Ser: 0.69 mg/dL (ref 0.50–1.35)
GFR calc Af Amer: >90 mL/min (ref >90)
GFR calc non Af Amer: >90 mL/min (ref >90)
Glucose, Bld: 141 mg/dL — ABNORMAL HIGH (ref 70–99)
Potassium: 3.9 mEq/L (ref 3.5–5.1)
Sodium: 138 mEq/L (ref 135–145)
Total Bilirubin: 0.2 mg/dL — ABNORMAL LOW (ref 0.3–1.2)
Total Protein: 6.3 g/dL (ref 6.0–8.3)

## 2013-09-19 LAB — HEMOGLOBIN AND HEMATOCRIT, BLOOD
HCT: 32.2 % — ABNORMAL LOW (ref 39.0–52.0)
Hemoglobin: 11.5 g/dL — ABNORMAL LOW (ref 13.0–17.0)

## 2013-09-19 LAB — BASIC METABOLIC PANEL
BUN: 11 mg/dL (ref 6–23)
CO2: 23 mEq/L (ref 19–32)
Calcium: 8.3 mg/dL — ABNORMAL LOW (ref 8.4–10.5)
Chloride: 105 mEq/L (ref 96–112)
Creatinine, Ser: 0.65 mg/dL (ref 0.50–1.35)
GFR calc Af Amer: >90 mL/min (ref >90)
GFR calc non Af Amer: >90 mL/min (ref >90)
Glucose, Bld: 100 mg/dL — ABNORMAL HIGH (ref 70–99)
Potassium: 4.1 mEq/L (ref 3.5–5.1)
Sodium: 142 mEq/L (ref 135–145)

## 2013-09-19 LAB — CBC
HCT: 33.5 % — ABNORMAL LOW (ref 39.0–52.0)
Hemoglobin: 11.6 g/dL — ABNORMAL LOW (ref 13.0–17.0)
MCH: 32.9 pg (ref 26.0–34.0)
MCHC: 34.6 g/dL (ref 30.0–36.0)
MCV: 94.9 fL (ref 78.0–100.0)
Platelets: 157 10*3/uL (ref 150–400)
RBC: 3.53 MIL/uL — ABNORMAL LOW (ref 4.22–5.81)
RDW: 14.1 % (ref 11.5–15.5)
WBC: 8.5 10*3/uL (ref 4.0–10.5)

## 2013-09-19 LAB — GLUCOSE, CAPILLARY: Glucose-Capillary: 103 mg/dL — ABNORMAL HIGH (ref 70–99)

## 2013-09-19 MED ORDER — ALBUTEROL SULFATE (5 MG/ML) 0.5% IN NEBU
2.5000 mg | INHALATION_SOLUTION | Freq: Once | RESPIRATORY_TRACT | Status: AC
  Administered 2013-09-19: 2.5 mg via RESPIRATORY_TRACT
  Filled 2013-09-19: qty 0.5

## 2013-09-19 MED ORDER — INFLUENZA VAC SPLIT QUAD 0.5 ML IM SUSP
0.5000 mL | INTRAMUSCULAR | Status: AC
  Administered 2013-09-20: 0.5 mL via INTRAMUSCULAR
  Filled 2013-09-19: qty 0.5

## 2013-09-19 MED ORDER — IPRATROPIUM BROMIDE 0.02 % IN SOLN
0.5000 mg | Freq: Once | RESPIRATORY_TRACT | Status: AC
  Administered 2013-09-19: 0.5 mg via RESPIRATORY_TRACT
  Filled 2013-09-19: qty 2.5

## 2013-09-19 NOTE — Progress Notes (Signed)
Occupational Therapy Evaluation Patient Details Name: [name] MRN: [id number] DOB: 06/12/1956 Today's Date: 09/19/2013 Time: 7829-5621 OT Time Calculation (min): 43 min  OT Assessment / Plan / Recommendation History of present illness s/p fall . L humerus fx. Underwent L  Shoulder hemiarthroplasty.    Clinical Impression   PTA, pt apparently lived independently alone. Pt appeared confused during assessment and was oriented to place and self only. Pt with history of Schitzoaffective disorder per nsg.  Required Max assist with bed mobility and +2 for transfers. Demonstrated an ataxic gait pattern with significant posterior lean during transfers. Poor safety/insight/judgement. Pt is unable to D/C home alone due to decreased caregiver support and will need SNF for rehab. Rec SW consult for SNF placement and PT consult for mobility. Discussed POC with PA. Received vo to follow TSA protocol per Dr. Ranell Patrick. Discussed D/C concerns with PA and nsg. Pt will benefit from skilled OT services to facilitate D/C to next venue due to below deficits.    OT Assessment  Patient needs continued OT Services    Follow Up Recommendations  SNF    Barriers to Discharge Decreased caregiver support    Equipment Recommendations  None recommended by OT    Recommendations for Other Services PT consult;Other (comment) (Sw consult)  Frequency  Min 3X/week    Precautions / Restrictions Precautions Precautions: Shoulder;Fall Type of Shoulder Precautions: TSA protocla Shoulder Interventions: Shoulder sling/immobilizer;Off for dressing/bathing/exercises (due to cognitive status, rec on at all times with the except) Precaution Booklet Issued: Yes (comment) Precaution Comments: Due to apparent cognitive deficits, rec sling on at all times with the exception of ADL and excercise. Required Braces or Orthoses: Sling Restrictions Weight Bearing Restrictions: Yes LUE Weight Bearing: Non weight bearing   Pertinent  Vitals/Pain Unable to state #. Premedicated. Repositioned. Ice to L shoulder.    ADL  Eating/Feeding: Set up Grooming: Minimal assistance Where Assessed - Grooming: Supported sitting Upper Body Bathing: Moderate assistance Where Assessed - Upper Body Bathing: Supported sitting Lower Body Bathing: Maximal assistance Where Assessed - Lower Body Bathing: Supported sit to stand Upper Body Dressing: Maximal assistance Where Assessed - Upper Body Dressing: Supported sitting Lower Body Dressing: Maximal assistance Where Assessed - Lower Body Dressing: Supported sit to Pharmacist, hospital: Maximal assistance;Simulated Statistician Method: Stand pivot Toileting - Clothing Manipulation and Hygiene: Maximal assistance Equipment Used: Gait belt Transfers/Ambulation Related to ADLs: Max A. Unable to ambulate due to poor/unsteady ataxic gait and severe posterior lean ADL Comments: significant deficits    OT Diagnosis: Generalized weakness;Cognitive deficits;Acute pain;Ataxia  OT Problem List: Decreased strength;Decreased range of motion;Decreased activity tolerance;Impaired balance (sitting and/or standing);Decreased coordination;Decreased cognition;Decreased safety awareness;Decreased knowledge of precautions;Impaired UE functional use;Increased edema;Pain OT Treatment Interventions: Self-care/ADL training;Therapeutic exercise;Therapeutic activities;Cognitive remediation/compensation;Patient/family education;Balance training   OT Goals(Current goals can be found in the care plan section) Acute Rehab OT Goals Patient Stated Goal: to go to [employer]iro[employer] OT Goal Formulation: Patient unable to participate in goal setting Time For Goal Achievement: 10/03/13 Potential to Achieve Goals: Good  Visit Information  Last OT Received On: 09/19/13 Assistance Needed: +2 History of Present Illness: s/p fall        Prior Functioning     Home Living Family/patient expects to be discharged to::  Skilled nursing facility Living Arrangements: Alone Prior Function Level of Independence: Independent Comments: unsure of PLOF Communication Communication: No difficulties Dominant Hand: Left         Vision/Perception     Cognition  Cognition Arousal/Alertness: Lethargic  Behavior During Therapy: Flat affect;Anxious Overall Cognitive Status: No family/caregiver present to determine baseline cognitive functioning Memory: Decreased recall of precautions    Extremity/Trunk Assessment Upper Extremity Assessment Upper Extremity Assessment: LUE deficits/detail LUE Deficits / Details: s/p hemiarthroplasty. following TSA protocol. Able to tolerate L elbow flex/ext to around 40 degrees total. unable to achieve full elbow ext. LUE: Unable to fully assess due to pain LUE Coordination: decreased fine motor;decreased gross motor Lower Extremity Assessment Lower Extremity Assessment: Generalized weakness (ataxic gait. Difficulty advancing LLE. shuffling pattern) Cervical / Trunk Assessment Cervical / Trunk Assessment: Kyphotic     Mobility Bed Mobility Bed Mobility: Rolling Right;Right Sidelying to Sit Rolling Right: 2: Max assist Right Sidelying to Sit: 2: Max assist Transfers Transfers: Stand to Sit;Sit to Stand Sit to Stand: 2: Max assist Stand to Sit: 2: Max assist Details for Transfer Assistance: difficulty achieveing full upright posture     Exercise Shoulder Exercises Pendulum Exercise: Other (comment) (unable to tolerate at this time) Elbow Flexion: AAROM;Left;5 reps;Seated Elbow Extension: AAROM;Left;5 reps;Seated Wrist Flexion: AROM;Left;5 reps Wrist Extension: AROM;Left;5 reps Digit Composite Flexion: AROM;Left;5 reps Composite Extension: AROM;Left;5 reps Donning/doffing shirt without moving shoulder: Maximal assistance Method for sponge bathing under operated UE: Maximal assistance Donning/doffing sling/immobilizer: Maximal assistance Correct positioning of  sling/immobilizer: Maximal assistance ROM for elbow, wrist and digits of operated UE: Maximal assistance Sling wearing schedule (on at all times/off for ADL's): Maximal assistance Proper positioning of operated UE when showering: Maximal assistance Positioning of UE while sleeping: Maximal assistance   Balance Balance Balance Assessed: Yes Static Sitting Balance Static Sitting - Balance Support: Feet supported (SBA) Static Standing Balance Static Standing - Balance Support: Right upper extremity supported;During functional activity Static Standing - Level of Assistance: 2: Max assist (significant posterior lean)   End of Session OT - End of Session Equipment Utilized During Treatment: Gait belt Activity Tolerance: Patient limited by pain;Other (comment) (limited by cognition/behavioral impairment) Patient left: in chair;with call bell/phone within reach;with chair alarm set Nurse Communication: Mobility status;Precautions;Weight bearing status;Other (comment) (need for chair alarm)  GO     Andraya Frigon,HILLARY 09/19/2013, 10:20 AM [facility], OTR/L  [phone] 09/19/2013

## 2013-09-19 NOTE — Op Note (Signed)
NAMEABBY, STINES             ACCOUNT NO.:  [id number]  MEDICAL RECORD NO.:  [id number]  LOCATION:  6N19C                        FACILITY:  [facility]  PHYSICIAN:  Almedia Balls. Ranell Patrick, M.D. DATE OF BIRTH:  17-Nov-1956  DATE OF PROCEDURE:  09/18/2013 DATE OF DISCHARGE:                              OPERATIVE REPORT   PREOPERATIVE DIAGNOSIS:  Left shoulder fracture and dislocation with 4- part head splitting fracture.  POSTOPERATIVE DIAGNOSIS:  Left shoulder fracture and dislocation with 4- part head splitting fracture.  PROCEDURE PERFORMED:  Left shoulder hemiarthroplasty using DePuy Global Unite prosthesis.  ATTENDING SURGEON:  Almedia Balls. Ranell Patrick, M.D.  ASSISTANT:  Donnie Coffin. Dixon, PA-C, who was scrubbed during the entire procedure and necessary for satisfactory completion of surgery.  ANESTHESIA:  General anesthesia was used plus interscalene block.  ESTIMATED BLOOD LOSS:  150 mL.  FLUID REPLACEMENT:  1500 mL of crystalloid.  INSTRUMENT COUNTS:  Correct.  COMPLICATIONS:  There were no complications.  ANTIBIOTICS:  Perioperative antibiotics were given.  INDICATIONS:  The patient is a 57 year old male who suffered a posterior shoulder fracture and dislocation, initially evaluated by my partner, Dr. Durene Romans.  Attempts at closed reduction yielded a partial reduction of his fractured shoulder with essentially displacement of the proximal humeral fragments with the large portion of the head being posterior to the glenoid face, the portion of the head within the glenoid fossa and then just comminuted greater tuberosity and lesser tuberosity fractures.  The patient was counseled regarding the need to perform a shoulder hemiarthroplasty to restore proximal humeral anatomy and allow him to be able to use his arm and move his arm in the future. Risks and benefits were discussed.  The patient would like to proceed. Informed consent obtained.  DESCRIPTION OF PROCEDURE:  After an  adequate level of anesthesia was achieved, the patient was positioned in modified beach-chair position. Left shoulder was correctly identified, sterilely prepped and draped. Time-out called.  We entered the shoulder using a deltopectoral incision starting at the coracoid process extending down to the anterior humerus, dissection down through the subcutaneous tissues using Bovie, identified the cephalic vein, took it laterally over the deltoid, pectoralis taken medially.  We entered the fracture site.  The clavicle pectoral fascia opened, deltoid retracted.  The lesser tuberosity was free, but still attached to some bone from the biceps groove.  We osteotomized the biceps that portion of the bone off of the lesser tuberosity and then debulked the lesser tuberosity with the subscapularis.  Placed the #2 FiberWire suture in a modified Mason-Allen suture technique, medial to the lesser tuberosity with good tendon purchase.  The greater tuberosity was in multiple pieces, but we were able to retrieve these pieces and basically take the #2 FiberWire and suture just lateral to the greater tuberosity with multiple Mason-Allen sutures x3.  We next went ahead and identified the fractured humeral head, which was posterior to and it was about 75% of the head and was stuck behind the glenoid, tucked back in that fossa back behind the glenoid face.  We were able to deliver that out and removed it and retrieved bone for bone grafting.  At the end of surgery, we then  prepared our humeral shaft.  We checked the glenoid face, which was in good condition.  We went ahead and released a little bit of capsule off the backside of the subscap just to allow for better mobility and then we prepared the humeral shaft, reaming up to a size 10.  We then went ahead and trialed with the 10 body with a -5 10 Global Unite adapter for the metaphysis and then the 48 neutral head, 48 x 18 neutral head.  We placed that into  the shoulder, checked our height and our version, made notations and marks on the shaft and also on the implant.  We then went ahead and we had good soft tissue coverage and removed the trial implants, thoroughly irrigated and dried the canal, and then we cemented the real Global Unite 10 stem with a 10 porecoat -5 proximal portion and then the 48 head was impacted on as well, 48 x 18. Once we put this down to the appropriate depth with the cement in place in the appropriate version, allowed the cement to harden, we then reduced the shoulder, reduced the tuberosity, placed in around the [place] stitch through the medial hole and through the medial to the lesser tuberosity and lateral to the greater tuberosity.  We had multiple rotator interval sutures with #2 Hi-Fi and then also sutures through drill holes in the humeral shaft, that went up medial to the lesser and lateral to the greater to hold the tuberosities to the shaft.  We bone grafted the porecoat anteriorly with excellent bony apposition and soft tissue apposition, and we tied all the suture down and everything moved together as a nice unit.  We effectively reconstructed that proximal humeral anatomy.  We had felt like the version on the humeral implant was maybe 5 degrees of retroversion and that was with the anterior fin adjacent to the biceps groove.  We ranged the shoulder, everything moved stably together.  We thoroughly irrigated the subdeltoid interval and repaired the deltopectoral interval with 0 Vicryl suture followed by 2-0 Vicryl in the subcutaneous closure and staples for skin.  Sterile bandage was applied.  The patient tolerated the procedure well.     Almedia Balls. Ranell Patrick, M.D.     SRN/MEDQ  D:  09/18/2013  T:  09/19/2013  Job:  [id number]

## 2013-09-19 NOTE — Progress Notes (Signed)
Subjective: 1 Day Post-Op Procedure(s) (LRB): LEFT SHOULDER HEMI-ARTHROPLASTY (Left) Patient reports pain as 4 on 0-10 scale.  Pt doing well this AM, pain well controlled with oral and IV medication.  Pt states pain in sharp and feels tight in shoulder. No change to appetite and pt able to transfer to chair and bathroom.  Pt denies N/V/F/C, chest pain SOB, parasthesias bilaterally.  Objective: Vital signs in last 24 hours: Temp:  [97.4 F (36.3 C)-98.6 F (37 C)] 97.7 F (36.5 C) (10/04 0554) Pulse Rate:  [68-88] 88 (10/04 0554) Resp:  [15-24] 20 (10/04 0554) BP: (103-143)/(50-89) 143/87 mmHg (10/04 0554) SpO2:  [93 %-100 %] 100 % (10/04 0554) Weight:  [93.8 kg (206 lb 12.7 oz)] 93.8 kg (206 lb 12.7 oz) (10/03 2100)  Intake/Output from previous day: 10/03 0701 - 10/04 0700 In: 2700 [I.V.:2600; IV Piggyback:100] Out: 250 [Urine:250] Intake/Output this shift:     Recent Labs  09/16/13 1610 09/19/13 0605  HGB 15.6 11.5*    Recent Labs  09/16/13 1610 09/19/13 0605  WBC 15.1*  --   RBC 4.69  --   HCT 43.6 32.2*  PLT 244  --     Recent Labs  09/17/13 0432 09/19/13 0605  NA 133* 142  K 3.8 4.1  CL 101 105  CO2 23 23  BUN 19 11  CREATININE 0.98 0.65  GLUCOSE 117* 100*  CALCIUM 8.3* 8.3*   No results found for this basename: LABPT, INR,  in the last 72 hours  WD WN male, NAD, A/Ox3, appears stated age.  Dressing C/D/I, good ROM in L wrist and hand.  Upper extremitties are NVI with equal strength in hands bilaterally.  Radial pulses 2+ bilaterally.  Cap refill <2 secs.  Assessment/Plan: 1 Day Post-Op Procedure(s) (LRB): LEFT SHOULDER HEMI-ARTHROPLASTY (Left) Pt to continue adequate pain management Pt to continue PT OT in elbow wrist and hand of left upper extremity Pt to be d/c at surgeons preference  [name] 09/19/2013, 9:13 AM

## 2013-09-19 NOTE — Progress Notes (Signed)
Called around 2250 to assess patient with low oxygen saturations. Upon arrival to room patient sitting on edge of bed in no distress [place] 3L 98%. No wheezing heard, diminished in lower left lobe. Chest xray obtained per MD. Patient encouraged to take slower, deep breaths. Will continue to monitor, advised bedside RN to call with further needs.

## 2013-09-19 NOTE — Progress Notes (Signed)
Md on call notified that pt o2 stats were 96% on 3l and had dropped to 90% pt also had wheezes in the upper lobes and a cough pt did appear to be anxious. Rapid response call evaluate pt. New orders were received. After chest xray pt would carry on with normal conversation with evidence of his shortness of breath pt was given his HS medication alng with breathing treatment continuous pulse ox was place. Pt went to sleep and o2 stats became WNL with 3l o2 will continue to monitor. Ilean Skill LPN

## 2013-09-20 ENCOUNTER — Inpatient Hospital Stay (HOSPITAL_COMMUNITY): Payer: Medicaid Other

## 2013-09-20 DIAGNOSIS — I10 Essential (primary) hypertension: Secondary | ICD-10-CM

## 2013-09-20 DIAGNOSIS — F259 Schizoaffective disorder, unspecified: Secondary | ICD-10-CM | POA: Diagnosis present

## 2013-09-20 DIAGNOSIS — R400 Somnolence: Secondary | ICD-10-CM | POA: Diagnosis not present

## 2013-09-20 DIAGNOSIS — R404 Transient alteration of awareness: Secondary | ICD-10-CM

## 2013-09-20 DIAGNOSIS — M7500 Adhesive capsulitis of unspecified shoulder: Secondary | ICD-10-CM | POA: Diagnosis present

## 2013-09-20 DIAGNOSIS — S42209A Unspecified fracture of upper end of unspecified humerus, initial encounter for closed fracture: Secondary | ICD-10-CM

## 2013-09-20 LAB — AMMONIA: Ammonia: 35 umol/L (ref 11–60)

## 2013-09-20 LAB — CBC WITH DIFFERENTIAL/PLATELET
Basophils Absolute: 0 10*3/uL (ref 0.0–0.1)
Basophils Relative: 0 % (ref 0–1)
Eosinophils Absolute: 0.3 10*3/uL (ref 0.0–0.7)
Eosinophils Relative: 3 % (ref 0–5)
HCT: 29 % — ABNORMAL LOW (ref 39.0–52.0)
Hemoglobin: 10.2 g/dL — ABNORMAL LOW (ref 13.0–17.0)
Lymphocytes Relative: 15 % (ref 12–46)
Lymphs Abs: 1.5 10*3/uL (ref 0.7–4.0)
MCH: 33.4 pg (ref 26.0–34.0)
MCHC: 35.2 g/dL (ref 30.0–36.0)
MCV: 95.1 fL (ref 78.0–100.0)
Monocytes Absolute: 0.4 10*3/uL (ref 0.1–1.0)
Monocytes Relative: 4 % (ref 3–12)
Neutro Abs: 7.3 10*3/uL (ref 1.7–7.7)
Neutrophils Relative %: 77 % (ref 43–77)
Platelets: 156 10*3/uL (ref 150–400)
RBC: 3.05 MIL/uL — ABNORMAL LOW (ref 4.22–5.81)
RDW: 14.3 % (ref 11.5–15.5)
WBC: 9.5 10*3/uL (ref 4.0–10.5)

## 2013-09-20 LAB — COMPREHENSIVE METABOLIC PANEL
ALT: 13 U/L (ref 0–53)
AST: 17 U/L (ref 0–37)
Albumin: 2.5 g/dL — ABNORMAL LOW (ref 3.5–5.2)
Alkaline Phosphatase: 63 U/L (ref 39–117)
BUN: 13 mg/dL (ref 6–23)
CO2: 24 mEq/L (ref 19–32)
Calcium: 8.2 mg/dL — ABNORMAL LOW (ref 8.4–10.5)
Chloride: 103 mEq/L (ref 96–112)
Creatinine, Ser: 0.85 mg/dL (ref 0.50–1.35)
GFR calc Af Amer: >90 mL/min (ref >90)
GFR calc non Af Amer: >90 mL/min (ref >90)
Glucose, Bld: 196 mg/dL — ABNORMAL HIGH (ref 70–99)
Potassium: 3.7 mEq/L (ref 3.5–5.1)
Sodium: 137 mEq/L (ref 135–145)
Total Bilirubin: 0.2 mg/dL — ABNORMAL LOW (ref 0.3–1.2)
Total Protein: 5.4 g/dL — ABNORMAL LOW (ref 6.0–8.3)

## 2013-09-20 LAB — BLOOD GAS, ARTERIAL
Acid-Base Excess: 1.5 mmol/L (ref 0.0–2.0)
Bicarbonate: 25.3 mEq/L — ABNORMAL HIGH (ref 20.0–24.0)
Drawn by: 225631
FIO2: 0.21 %
O2 Saturation: 90.3 %
Patient temperature: 98.6
TCO2: 26.5 mmol/L (ref 0–100)
pCO2 arterial: 37.8 mmHg (ref 35.0–45.0)
pH, Arterial: 7.44 (ref 7.350–7.450)
pO2, Arterial: 64 mmHg — ABNORMAL LOW (ref 80.0–100.0)

## 2013-09-20 LAB — PHENYTOIN LEVEL, TOTAL: Phenytoin Lvl: 10.1 ug/mL (ref 10.0–20.0)

## 2013-09-20 MED ORDER — POLYETHYLENE GLYCOL 3350 17 G PO PACK
17.0000 g | PACK | Freq: Once | ORAL | Status: AC
  Administered 2013-09-20: 17 g via ORAL
  Filled 2013-09-20: qty 1

## 2013-09-20 NOTE — Progress Notes (Signed)
Occupational Therapy Treatment Patient Details Name: Eh Sesay MRN: [id number] DOB: [birth year] Today's Date: 09/20/2013 Time: 4098-1191 OT Time Calculation (min): 39 min  OT Assessment / Plan / Recommendation  History of present illness Pt is 57 y/o male admitted for fall resulting in left humerus fx with s/p left TSA.     OT comments  Pt s/p left TSA sx.  Pt very lethargic today and difficult to wake up.  RN and MD aware. MD to look further into home medications to determine cause of lethargy.  RN reports pt very lethargic yesterday but becomes increasingly alert and more steady as day progressed and was ambulating in hall yesterday afternoon.  Follow Up Recommendations  SNF    Barriers to Discharge       Equipment Recommendations  None recommended by OT    Recommendations for Other Services    Frequency Min 3X/week   Progress towards OT Goals Progress towards OT goals: Progressing toward goals  Plan Discharge plan remains appropriate    Precautions / Restrictions Precautions Precautions: Shoulder;Fall Type of Shoulder Precautions: TSA protocla Shoulder Interventions: Shoulder sling/immobilizer;Off for dressing/bathing/exercises Precaution Booklet Issued: Yes (comment) Precaution Comments: Due to apparent cognitive deficits, rec sling on at all times with the exception of ADL and excercise. Required Braces or Orthoses: Sling Restrictions Weight Bearing Restrictions: Yes LUE Weight Bearing: Non weight bearing   Pertinent Vitals/Pain See vitals    ADL  Eating/Feeding: Performed;Set up Where Assessed - Eating/Feeding: Chair Grooming: Performed;Wash/dry face;Minimal assistance Where Assessed - Grooming: Supported sitting Upper Body Dressing: Performed;Maximal assistance Where Assessed - Upper Body Dressing: Unsupported sitting Toilet Transfer: Simulated;+2 Total assistance Toilet Transfer: Patient Percentage: 60% Toilet Transfer Method: Stand pivot Education administrator:  (bed<>chair) Equipment Used: Gait belt (LUE sling) Transfers/Ambulation Related to ADLs: Unable to ambulate due to unsteadiness. +2 assist to transfer bed<>chair. ADL Comments: Pt very lethargic during session.  OT spoke with Dr Ranell Patrick earlier in day who requested OT begin [facility] lap slides with pt today along with other exercises.    OT Diagnosis:    OT Problem List:   OT Treatment Interventions:     OT Goals(current goals can now be found in the care plan section) Acute Rehab OT Goals Patient Stated Goal: Pt too lethargic to give goals.  OT Goal Formulation: Patient unable to participate in goal setting Time For Goal Achievement: 10/03/13 Potential to Achieve Goals: Good  Visit Information  Last OT Received On: 09/20/13 Assistance Needed: +2 History of Present Illness: Pt is 57 y/o male admitted for fall resulting in left humerus fx with s/p left TSA.      Subjective Data      Prior Functioning       Cognition  Cognition Arousal/Alertness: Lethargic Behavior During Therapy: Flat affect Overall Cognitive Status: No family/caregiver present to determine baseline cognitive functioning Memory: Decreased recall of precautions;Decreased short-term memory    Mobility  Bed Mobility Bed Mobility: Supine to Sit;Sitting - Scoot to Edge of Bed Supine to Sit: 1: +2 Total assist;With rails;HOB elevated Supine to Sit: Patient Percentage: 40% Sitting - Scoot to Edge of Bed: 3: Mod assist Details for Bed Mobility Assistance: Max directional cueing and assist to elevate trunk OOB. Transfers Transfers: Sit to Stand;Stand to Sit Sit to Stand: 1: +2 Total assist;From bed Sit to Stand: Patient Percentage: 60% Stand to Sit: 3: Mod assist Details for Transfer Assistance: Multiple attempts to use momentum to power up from bed. Pt with wide base of support and  generally unsteady,    Exercises  Shoulder Exercises Shoulder Flexion: AAROM;Left;5 reps (40 degrees) Elbow Flexion:  AAROM;Left;10 reps;Seated Elbow Extension: AAROM;Left;10 reps;Seated Wrist Flexion: AROM;10 reps;Left;Seated Wrist Extension: AROM;Left;10 reps;Seated Digit Composite Flexion: AROM;Left;10 reps;Seated Composite Extension: AROM;Left;10 reps;Seated Other Exercises Other Exercises: Performed AAROM lap slides x5 reps. Donning/doffing shirt without moving shoulder: Maximal assistance Donning/doffing sling/immobilizer: Maximal assistance Correct positioning of sling/immobilizer: Maximal assistance ROM for elbow, wrist and digits of operated UE: Moderate assistance Sling wearing schedule (on at all times/off for ADL's): Maximal assistance Proper positioning of operated UE when showering: Maximal assistance Positioning of UE while sleeping: Maximal assistance   Balance Balance Balance Assessed: Yes Static Sitting Balance Static Sitting - Balance Support: Feet supported Static Standing Balance Static Standing - Balance Support: No upper extremity supported Static Standing - Level of Assistance: 5: Stand by assistance   End of Session OT - End of Session Equipment Utilized During Treatment: Gait belt Activity Tolerance: Patient limited by pain;Patient limited by lethargy Patient left: in chair;with call bell/phone within reach Nurse Communication: Mobility status  GO   09/20/2013 Cipriano Mile OTR/L Pager [phone] Office [phone]   Cipriano Mile 09/20/2013, 4:36 PM

## 2013-09-20 NOTE — Progress Notes (Signed)
Orthopedics Progress Note  Subjective: Patient very sleepy, once aroused, oriented and follows commands, complains of shoulder pain  Objective:  Filed Vitals:   09/20/13 0650  BP: 84/60  Pulse: 76  Temp:   Resp:     General: Awake and alert  Musculoskeletal: left shoulder staple line intact, mild erythema Neurovascularly intact  Lab Results  Component Value Date   WBC 8.5 09/19/2013   HGB 11.6* 09/19/2013   HCT 33.5* 09/19/2013   MCV 94.9 09/19/2013   PLT 157 09/19/2013       Component Value Date/Time   NA 138 09/19/2013 1250   K 3.9 09/19/2013 1250   CL 103 09/19/2013 1250   CO2 25 09/19/2013 1250   GLUCOSE 141* 09/19/2013 1250   BUN 9 09/19/2013 1250   CREATININE 0.69 09/19/2013 1250   CREATININE 0.89 12/25/2012 1413   CALCIUM 8.5 09/19/2013 1250   GFRNONAA >90 09/19/2013 1250   GFRAA >90 09/19/2013 1250    Lab Results  Component Value Date   INR 0.98 04/18/2011   INR 1.0 04/18/2009    Assessment/Plan: POD #2 s/p Procedure(s): LEFT SHOULDER HEMI-ARTHROPLASTY Patient had episode of hypoxia night before last with CXR showing L LE atelectasis, responded to albuterol nebs, now with some increased confusion and lethargy per nursing and hypotension.  Patient on multiple psych meds, all centrally acting.  I have asked Internal Medicine to evaluate patient.  Repeat labs and CXR ordered.  Appreciate IM support!  Almedia Balls. Ranell Patrick, MD 09/20/2013 8:46 AM

## 2013-09-20 NOTE — Progress Notes (Signed)
Pt's BP low this am-89/54 with dinamap and 84/60 manually.  Pulse 76. Notified [employer] PA.  He will see patient this am.

## 2013-09-20 NOTE — Consult Note (Signed)
[name] Medical Consultation  [name] EAV:[id number] DOB: 03-21-1956 DOA: 09/16/2013 PCP: Randal Buba, MD   Requesting physician: Trudee Grip, orthopedic surgery Date of consultation: 09/20/13 Reason for consultation: Somnolence/altered mental status  Impression/Recommendations Principal Problem:   Fracture dislocation of left shoulder joint: Management as per orthopedic surgery. May end up needing skilled nursing  Active Problems:   HYPERLIPIDEMIA: Stable. Continue statin    HYPERTENSION: Stable. Continue ACE inhibitor and beta blocker.    SEIZURE DISORDER, HX OF: Stable. On Dilantin. See below. We'll check Dilantin level.    Constipation, chronic: Patient complains of no bowel movement x3 days. When necessary MiraLAX ordered as ordered a dose for now.    Somnolence: Primary reason for consult. I suspect likely patient is on a number of sedating medications chronically including 2 different benzodiazepines plus doxepin, but what is now pushed him over the edge is narcotics plus muscle relaxers.. For now have stopped his benzodiazepines and doxepin. We'll continue Seroquel as we do not want to put him in psychosis. Have stopped narcotics and muscle relaxers for now as well. To be complete, checking ammonia level and given low albumin, we'll also check Dilantin level to ensure there is no Dilantin toxicity. Patient already showing improvement.    Schizoaffective disorder: Continue Seroquel, holding doxepin for now.     I will followup again tomorrow. Please contact me if I can be of assistance in the meanwhile. Thank you for this consultation.  Chief Complaint: Somnolence/altered mental status  HPI:  Patient is a 57 year old white male past medical history of schizoaffective disorder, hypertension and chronic constipation who presented after a fall and noted to have a left shoulder dislocation/fracture. He was admitted to the orthopedic service and underwent repair  on the night of 10/3. Over the next 36 hours, the patient has become more and more somnolent. He is on a number of psychiatric medications and orthopedic surgery was concerned about the possibility of this being medication related versus other. Hospitalists were called for further evaluation on the morning of 10/5.  Upon arrival to the patient's room midafternoon, the patient was more awake and alert. According to nursing this was more closer to his baseline. They had held his Klonopin which he is on at home.  Review of Systems:  Patient seen in his room. He is doing okay and more awake. Denies any headaches, vision changes, dysphasia, chest pain, palpitations, shortness of breath, wheeze, cough, abdominal pain, hematuria, dysuria, diarrhea. He does complain of some constipation and has not yet moved his bowels in 3 days. Denies any focal extremity numbness or weakness or pain or nausea or vomiting. His review of systems otherwise negative.  Past Medical History  Diagnosis Date  . Seizures   . Hypertension   . GERD (gastroesophageal reflux disease)   . Dyslipidemia   . Schizoaffective disorder   . Depression   . Anxiety   . MRSA carrier    Past Surgical History  Procedure Laterality Date  . Self orchectomy    . Colonoscopy  2010  . Orif shoulder fracture    . Shoulder closed reduction Left 09/16/2013    Procedure: CLOSED REDUCTION SHOULDER;  Surgeon: Shelda Pal, MD;  Location: [facility];  Service: Orthopedics;  Laterality: Left;   Social History:  reports that he has never smoked. He has never used smokeless tobacco. He reports that he does not drink alcohol or use illicit drugs.  Allergies  Allergen Reactions  . Sulfa Antibiotics Nausea And Vomiting  Family History  Problem Relation Age of Onset  . Stroke Father     Living at 76  . Stroke Mother     Stroke in late 40's. Died in her 18's    Prior to Admission medications   Medication Sig Start Date End Date Taking?  Authorizing Provider  ALPRAZolam Prudy Feeler) 1 MG tablet Take 1 mg by mouth 3 (three) times daily.  05/19/12  Yes Historical Provider, MD  aspirin EC 81 MG tablet Take 81 mg by mouth at bedtime.    Yes Historical Provider, MD  calcium carbonate (TUMS - DOSED IN MG ELEMENTAL CALCIUM) 500 MG chewable tablet Chew 1 tablet by mouth daily as needed.   Yes Historical Provider, MD  clonazePAM (KLONOPIN) 2 MG tablet Take 2.5 tablets (5 mg total) by mouth 2 (two) times daily. 05/18/13  Yes Charm Rings, MD  doxepin (SINEQUAN) 150 MG capsule Take 900 mg by mouth at bedtime.   Yes Historical Provider, MD  lisinopril (PRINIVIL,ZESTRIL) 5 MG tablet Take 1 tablet (5 mg total) by mouth daily. 02/05/13  Yes Charm Rings, MD  metoprolol succinate (TOPROL-XL) 50 MG 24 hr tablet Take 3 tablets (150 mg total) by mouth every evening. Take with or immediately following a meal. 07/23/13  Yes Lonia Skinner, MD  omeprazole (PRILOSEC) 20 MG capsule Take 20 mg by mouth daily as needed. For heartburn or acid reflux.   Yes Historical Provider, MD  phenytoin (DILANTIN) 50 MG tablet Chew 4 tablets (200 mg total) by mouth at bedtime. 05/21/13  Yes Charm Rings, MD  polyethylene glycol (MIRALAX / GLYCOLAX) packet Take 17 g by mouth daily as needed. For constipation. Mix into 8 ounces of fluid & drink 07/23/13  Yes Lonia Skinner, MD  QUEtiapine (SEROQUEL) 300 MG tablet Take 900 mg by mouth at bedtime.    Yes Historical Provider, MD  simvastatin (ZOCOR) 40 MG tablet Take 40 mg by mouth every evening.   Yes Historical Provider, MD  testosterone cypionate (DEPOTESTOTERONE CYPIONATE) 100 MG/ML injection Inject 2.5 mLs (250 mg total) into the muscle every 14 (fourteen) days. 05/20/13  Yes Charm Rings, MD   Physical Exam: Blood pressure 105/63, pulse 83, temperature 99.1 F (37.3 C), temperature source Axillary, resp. rate 20, height 5\' 11"  (1.803 m), weight 93.8 kg (206 lb 12.7 oz), SpO2 95.00%. Filed Vitals:   09/20/13 1418  BP: 105/63   Pulse: 83  Temp: 99.1 F (37.3 C)  Resp: 20     General:  Alert and oriented x3, no acute distress, limited affect  Eyes: Sclera nonicteric, extraocular movements are intact  ENT: Normocephalic, atraumatic, mucous members are slightly dry  Neck: Supple, no JVD  Cardiovascular: Regular rate and rhythm, S1-S2, soft 2/6 systolic ejection murmur  Respiratory: Clear to auscultation bilaterally  Abdomen: Soft, distended, nontender, hypoactive bowel sounds  Skin: No skin breaks, tears or lesions, dry skin  Musculoskeletal: No clubbing or cyanosis, trace pitting edema  Psychiatric: Patient is appropriate, no evidence of psychoses although he does have a slightly flattened affect  Neurologic: No focal deficits. Toes downgoing.   Labs on Admission:  Basic Metabolic Panel:  Recent Labs Lab 09/16/13 1610 09/17/13 0432 09/19/13 0605 09/19/13 1250 09/20/13 0945  NA 131* 133* 142 138 137  K 4.1 3.8 4.1 3.9 3.7  CL 96 101 105 103 103  CO2 24 23 23 25 24   GLUCOSE 133* 117* 100* 141* 196*  BUN 17 19 11 9 13   CREATININE 0.84  0.98 0.65 0.69 0.85  CALCIUM 9.6 8.3* 8.3* 8.5 8.2*   Liver Function Tests:  Recent Labs Lab 09/17/13 0432 09/19/13 1250 09/20/13 0945  AST 25 26 17   ALT 19 17 13   ALKPHOS 85 80 63  BILITOT 0.4 0.2* 0.2*  PROT 6.1 6.3 5.4*  ALBUMIN 3.3* 3.0* 2.5*   No results found for this basename: LIPASE, AMYLASE,  in the last 168 hours No results found for this basename: AMMONIA,  in the last 168 hours CBC:  Recent Labs Lab 09/16/13 1610 09/19/13 0605 09/19/13 1250 09/20/13 0945  WBC 15.1*  --  8.5 9.5  NEUTROABS 13.6*  --   --  7.3  HGB 15.6 11.5* 11.6* 10.2*  HCT 43.6 32.2* 33.5* 29.0*  MCV 93.0  --  94.9 95.1  PLT 244  --  157 156   Cardiac Enzymes: No results found for this basename: CKTOTAL, CKMB, CKMBINDEX, TROPONINI,  in the last 168 hours BNP: No components found with this basename: POCBNP,  CBG:  Recent Labs Lab 09/19/13 0649   GLUCAP 103*    Radiological Exams on Admission: Dg Chest 2 View  09/20/2013   *RADIOLOGY REPORT*  Clinical Data: Confusion, evaluate left lower lobe infiltrate, recent history of left shoulder surgery, subsequent encounter.  CHEST - 2 VIEW  Comparison: 09/19/2013; 09/18/2013; 12/08/2012  Findings: Grossly unchanged enlarged cardiac silhouette and mediastinal contours given persistently reduced lung volumes.  Ill- defined heterogeneous opacities within the bilateral mid and lower lungs are grossly unchanged, left greater than right.  No new focal airspace opacities.  No definite pleural effusion or pneumothorax. Grossly unchanged bones including sequela of left-sided head humeral arthroplasty, incompletely evaluated.  Skin staples overlying the left shoulder operative site.  IMPRESSION: Grossly unchanged findings of hypoventilation and bibasilar opacities, left greater than right, similarly remote prior examinations and favored to represent atelectasis or scar.   Original Report Authenticated By: Tacey Ruiz, MD   Dg Chest Port 1 View  09/19/2013   CLINICAL DATA:  Shortness of breath  EXAM: PORTABLE CHEST - 1 VIEW  COMPARISON:  Chest x-ray from 1 day prior  FINDINGS: Low volume lungs with new left base opacity, obscuring the diaphragm. Chronic scarring in the right mid lower lung. No acute change in heart size and mediastinal contours. Interval left glenohumeral arthroplasty. Gaseous distension of splenic flexure.  IMPRESSION: 1. New left base atelectasis or aspiration pneumonitis. 2. Low volume lungs with right lung scarring.   Electronically Signed   By: Tiburcio Pea M.D.   On: 09/19/2013 00:23   Dg Shoulder Left Port  09/18/2013   CLINICAL DATA:  Status post left shoulder arthroplasty.  EXAM: PORTABLE LEFT SHOULDER - 2+ VIEW  COMPARISON:  None.  FINDINGS: Frontal projection shows normal alignment of a left shoulder hemiarthroplasty.  IMPRESSION: Normal alignment status post left shoulder  arthroplasty.   Electronically Signed   By: Irish Lack M.D.   On: 09/18/2013 20:42    EKG: Independently reviewed. From 10/3: Normal sinus rhythm with left axis deviation, unchanged  Time spent: 25 minutes  Hollice Espy [name] Pager [phone]  If 7PM-7AM, please contact night-coverage [email or web address] Password [email or web address] 09/20/2013, 4:12 PM

## 2013-09-20 NOTE — Evaluation (Signed)
Physical Therapy Evaluation Patient Details Name: [name] MRN: [id number] DOB: 11/02/1956 Today's Date: 09/20/2013 Time: 4098-1191 PT Time Calculation (min): 24 min  PT Assessment / Plan / Recommendation History of Present Illness  Pt is 57 y/o male admitted for fall resulting in left humerus fx with s/p left TSA.    Clinical Impression  Patient is s/p left TSA surgery resulting in functional limitations due to the deficits listed below (see PT Problem List). Very limited evaluation due to very lethargic.  Spoke with RN and MD prior to attempting to see pt for second time and apparently pt was just awake talking with MD.  However when therapist entered pt completely asleep with breakfast spilled in lap and difficult to arouse to sternal rub.  Pt VSS and pt snoring.  Attempted to sit pt EOB to arouse and pt continues to remain asleep.  Pt only awoke for a brief second when moving left UE to adjust sling.  Pt would say something with slurred speech and return to sleep.  RN notified of pt's lethargy and MD aware.  MD to look further into home medications to determine cause of lethargy in AM.  Apparently pt was very similar yesterday, very lethargic in AM however awake and walking in PM.  Patient will benefit from skilled PT to increase their independence and safety with mobility to allow discharge to the venue listed below.     PT Assessment  Patient needs continued PT services    Follow Up Recommendations  SNF    Equipment Recommendations   (TBD)    Frequency Min 3X/week    Precautions / Restrictions Precautions Precautions: Shoulder;Fall Type of Shoulder Precautions: TSA protocla Shoulder Interventions: Shoulder sling/immobilizer;Off for dressing/bathing/exercises Precaution Booklet Issued: Yes (comment) Precaution Comments: Due to apparent cognitive deficits, rec sling on at all times with the exception of ADL and excercise. Required Braces or Orthoses:  Sling Restrictions Weight Bearing Restrictions: Yes LUE Weight Bearing: Non weight bearing   Pertinent Vitals/Pain Facial grimace with movement; pt unable to rate      Mobility  Bed Mobility Bed Mobility: Rolling Right;Rolling Left;Supine to Sit;Sitting - Scoot to [place] of Bed;Sit to Supine Rolling Right: 1: +2 Total assist Rolling Right: Patient Percentage: 0% Rolling Left: 1: +2 Total assist Rolling Left: Patient Percentage: 0% Supine to Sit: 1: +2 Total assist;HOB elevated Supine to Sit: Patient Percentage: 10% Sit to Supine: 1: +2 Total assist Sit to Supine: Patient Percentage: 10% Details for Bed Mobility Assistance: Pt very lethargic and unable to assist with bed mobility.  Attempted to sit pt EOB to awake pt to participate with PT.  RN and MD stated pt was just awake talking with MD and RN.  Pt will not arouse for therapy.  Transfers Transfers: Not assessed Ambulation/Gait Ambulation/Gait Assistance: Not tested (comment)    Exercises     PT Diagnosis: Difficulty walking;Acute pain  PT Problem List: Decreased strength;Decreased activity tolerance;Decreased balance;Decreased mobility;Decreased cognition;Decreased knowledge of use of DME;Decreased safety awareness;Decreased knowledge of precautions;Pain PT Treatment Interventions: DME instruction;Gait training;Functional mobility training;Therapeutic activities;Therapeutic exercise;Balance training;Neuromuscular re-education;Cognitive remediation;Patient/family education     PT Goals(Current goals can be found in the care plan section) Acute Rehab PT Goals Patient Stated Goal: Pt too lethargic to give goals.  PT Goal Formulation: Patient unable to participate in goal setting Time For Goal Achievement: 10/04/13 Potential to Achieve Goals: Good  Visit Information  Last PT Received On: 09/20/13 Assistance Needed: +2 History of Present Illness: Pt is 57 y/o male  admitted for fall resulting in left humerus fx with s/p left  TSA.         Prior Functioning  Home Living Family/patient expects to be discharged to:: Skilled nursing facility Living Arrangements: Alone Prior Function Comments: unsure of PLOF Communication Communication: Expressive difficulties (slurred speech due to lethargy) Dominant Hand: Left    Cognition  Cognition Arousal/Alertness: Lethargic Behavior During Therapy: Flat affect Overall Cognitive Status: No family/caregiver present to determine baseline cognitive functioning Memory: Decreased recall of precautions    Extremity/Trunk Assessment Lower Extremity Assessment Lower Extremity Assessment: Difficult to assess due to impaired cognition Cervical / Trunk Assessment Cervical / Trunk Assessment: Kyphotic   Balance Static Sitting Balance Static Sitting - Balance Support: Feet supported Static Standing Balance Static Standing - Balance Support: No upper extremity supported (Pt lethargic and unable to keep head up. ) Static Standing - Level of Assistance: 1: +1 Total assist  End of Session PT - End of Session Equipment Utilized During Treatment: Oxygen (2L) Activity Tolerance: Patient limited by lethargy Patient left: in bed;with call bell/phone within reach;with bed alarm set Nurse Communication: Mobility status;Weight bearing status  GP     Jazmin Vensel 09/20/2013, 11:04 AM  Jake Shark, PT DPT [phone]

## 2013-09-21 DIAGNOSIS — K5900 Constipation, unspecified: Secondary | ICD-10-CM

## 2013-09-21 DIAGNOSIS — S43026A Posterior dislocation of unspecified humerus, initial encounter: Secondary | ICD-10-CM

## 2013-09-21 MED ORDER — HYDROCODONE-ACETAMINOPHEN 5-325 MG PO TABS: 1.0000 | ORAL_TABLET | ORAL | Status: DC | PRN

## 2013-09-21 MED ORDER — SENNOSIDES-DOCUSATE SODIUM 8.6-50 MG PO TABS: 1.0000 | ORAL_TABLET | Freq: Two times a day (BID) | ORAL | Status: DC

## 2013-09-21 MED ORDER — ALPRAZOLAM 1 MG PO TABS: 1.0000 mg | ORAL_TABLET | Freq: Three times a day (TID) | ORAL | Status: DC | PRN

## 2013-09-21 MED ORDER — SENNOSIDES-DOCUSATE SODIUM 8.6-50 MG PO TABS
1.0000 | ORAL_TABLET | Freq: Two times a day (BID) | ORAL | Status: DC
  Administered 2013-09-21 – 2013-09-22 (×3): 1 via ORAL
  Filled 2013-09-21 (×3): qty 1

## 2013-09-21 NOTE — Progress Notes (Signed)
Physical Therapy Treatment Patient Details Name: [name] MRN: [id number] DOB: 05-29-1956 Today's Date: 09/21/2013 Time: 4098-1191 PT Time Calculation (min): 20 min  PT Assessment / Plan / Recommendation  History of Present Illness Pt is 57 y/o male admitted for fall resulting in left humerus fx with s/p left TSA.     PT Comments   Pt making slow progress towards physical therapy goals. Was able to show improvement with independence during functional mobility, but continues to be unsteady on his feet. Would benefit from a PFRW for safety. He reports concern about receiving therapy after d/c due to his insurance, but SNF would be safest d/c option for him at this time.  Follow Up Recommendations  SNF     Does the patient have the potential to tolerate intense rehabilitation     Barriers to Discharge        Equipment Recommendations  Rolling walker with 5" wheels (Platform on L)    Recommendations for Other Services    Frequency Min 3X/week   Progress towards PT Goals Progress towards PT goals: Progressing toward goals  Plan Current plan remains appropriate    Precautions / Restrictions Precautions Precautions: Shoulder;Fall Type of Shoulder Precautions: TSA protocla Shoulder Interventions: Shoulder sling/immobilizer;Off for dressing/bathing/exercises Precaution Comments: Due to apparent cognitive deficits, rec sling on at all times with the exception of ADL and excercise. Required Braces or Orthoses: Sling Restrictions Weight Bearing Restrictions: Yes LUE Weight Bearing: Non weight bearing   Pertinent Vitals/Pain Pt reports pain in L shoulder with burning that is radiating down to wrist. He does not rate pain on 0-10 when asked.    Mobility  Bed Mobility Bed Mobility: Supine to Sit;Sitting - Scoot to Edge of Bed;Scooting to [facility] Supine to Sit: 3: Mod assist;HOB elevated;With rails Sitting - Scoot to Edge of Bed: 4: Min guard;With rail Scooting to [facility]: 5: Set up;With  rail Details for Bed Mobility Assistance: VC's to maintain shoulder precautions and NWB status during transfer to EOB. Pt showed improved ability to maneuver in bed today. Transfers Transfers: Sit to Stand;Stand to Sit Sit to Stand: 3: Mod assist;From bed;With upper extremity assist Stand to Sit: 4: Min assist;To chair/3-in-1;With armrests Details for Transfer Assistance: Increased time to complete transfer to full standing with mod assist on R side. Once standing pt was steady. Ambulation/Gait Ambulation/Gait Assistance: 4: Min assist Ambulation Distance (Feet): 10 Feet Assistive device: 1 person hand held assist Ambulation/Gait Assistance Details: Pt showed unsteadiness while ambulating, especially during turns. Is safe to walk short distances (bed to chair) but would be safer for longer distances to ambulate with L PFRW Gait Pattern: Step-through pattern;Decreased stride length;Antalgic;Decreased weight shift to right;Trunk rotated posteriorly on right Gait velocity: decreased Stairs: No    Exercises     PT Diagnosis:    PT Problem List:   PT Treatment Interventions:     PT Goals (current goals can now be found in the care plan section) Acute Rehab PT Goals Patient Stated Goal: To return to PLOF PT Goal Formulation: With patient Time For Goal Achievement: 10/04/13 Potential to Achieve Goals: Good  Visit Information  Last PT Received On: 09/21/13 Assistance Needed: +1 History of Present Illness: Pt is 57 y/o male admitted for fall resulting in left humerus fx with s/p left TSA.      Subjective Data  Subjective: "It only hurts when I try to move." Patient Stated Goal: To return to PLOF   Cognition  Cognition Arousal/Alertness: Lethargic Behavior During Therapy: Flat  affect Overall Cognitive Status: No family/caregiver present to determine baseline cognitive functioning Memory: Decreased recall of precautions;Decreased short-term memory    Balance  Balance Balance  Assessed: Yes Static Sitting Balance Static Sitting - Balance Support: Feet supported;Right upper extremity supported Static Standing Balance Static Standing - Balance Support: Right upper extremity supported Static Standing - Level of Assistance: 5: Stand by assistance  End of Session PT - End of Session Equipment Utilized During Treatment: Gait belt Activity Tolerance: Patient limited by lethargy;Patient limited by fatigue Patient left: in chair;with call bell/phone within reach Nurse Communication: Mobility status   GP     Ruthann Cancer 09/21/2013, 10:19 AM  Ruthann Cancer, PT, DPT Acute Rehabilitation Services

## 2013-09-21 NOTE — Progress Notes (Signed)
Patient claimed that he has some belongings left at ED locker, he has clothes and his home keys with him during admission, called ED but they don't have any belongings kept for him, still unable to d/c patient at this time.

## 2013-09-21 NOTE — Progress Notes (Addendum)
CSW (Clinical Child psychotherapist) saw that pt is Medicaid. CSW spoke with pt again regarding need to stay at facility for 30 days in order for Medicaid to cover costs. Pt is now unagreeable to SNF. CSW explained safety issue if pt is to dc home alone. Pt reports having no concern and feels as though he will be able to get around just fine. CSW notified pt nurse, RN CM, and left message with MD office to notify MD.   Sharol Harness, LCSWA [phone]

## 2013-09-21 NOTE — Progress Notes (Signed)
Occupational Therapy Treatment Patient Details Name: Vikrant Pryce MRN: [id number] DOB: [birth year] Today's Date: 09/21/2013 Time: 0865-7846 OT Time Calculation (min): 18 min  OT Assessment / Plan / Recommendation  History of present illness Pt is 57 y/o male admitted for fall resulting in left humerus fx with s/p left TSA.     OT comments  Pt performed exercises for LUE (see exercise section below). Pt unsteady when ambulating and NOT safe to d/c home. Attempted to call case manager. Spoke with nurse and recommended holding d/c. Recommending SNF for d/c and pt open to idea.   Follow Up Recommendations  SNF    Barriers to Discharge       Equipment Recommendations  None recommended by OT    Recommendations for Other Services PT consult;Other (comment) (SW consult)  Frequency Min 3X/week   Progress towards OT Goals Progress towards OT goals: Progressing toward goals  Plan Discharge plan remains appropriate    Precautions / Restrictions Precautions Precautions: Shoulder;Fall Type of Shoulder Precautions: TSA protocla Shoulder Interventions: Shoulder sling/immobilizer;Off for dressing/bathing/exercises Precaution Booklet Issued: Yes (comment) Precaution Comments: Due to apparent cognitive deficits, rec sling on at all times with the exception of ADL and excercise. Required Braces or Orthoses: Sling Restrictions Weight Bearing Restrictions: Yes LUE Weight Bearing: Non weight bearing   Pertinent Vitals/Pain Pain in LUE, not rated. Repositioned.     ADL  Upper Body Dressing: Moderate assistance;Minimal assistance (shoulder sling- Mod A-positioning and Min A-donning/doffing) Where Assessed - Upper Body Dressing: Unsupported sitting Toilet Transfer: Min guard Toilet Transfer Method: Sit to stand; Stand to sit Toilet Transfer Equipment: Comfort height toilet Toileting - Clothing Manipulation and Hygiene: Min guard Where Assessed - Toileting Clothing Manipulation and Hygiene:  Standing Equipment Used: Gait belt;Other (comment) (shoulder sling) Transfers/Ambulation Related to ADLs: Min guard  ADL Comments: Practiced exercises of LUE.    OT Diagnosis:    OT Problem List:   OT Treatment Interventions:     OT Goals(current goals can now be found in the care plan section) Acute Rehab OT Goals Patient Stated Goal: not stated OT Goal Formulation: Patient unable to participate in goal setting Time For Goal Achievement: 10/03/13 Potential to Achieve Goals: Good ADL Goals Pt Will Perform Upper Body Bathing: with supervision;sitting Pt Will Perform Upper Body Dressing: with supervision;sitting Pt/caregiver will Perform Home Exercise Program: With Supervision;With written HEP provided;Increased ROM;Left upper extremity (Norris protocol) Additional ADL Goal #1: S with donning/doffing sling Additional ADL Goal #2: S with positioing LUE in bed and sitting  Visit Information  Last OT Received On: 09/21/13 Assistance Needed: +1 History of Present Illness: Pt is 57 y/o male admitted for fall resulting in left humerus fx with s/p left TSA.      Subjective Data      Prior Functioning       Cognition  Cognition Arousal/Alertness: Awake/alert Behavior During Therapy: WFL for tasks assessed/performed Overall Cognitive Status: No family/caregiver present to determine baseline cognitive functioning Memory: Decreased recall of precautions;Decreased short-term memory    Mobility  Bed Mobility Bed Mobility: Supine to Sit;Sit to Supine Supine to Sit: 5: Supervision Sit to Supine: 5: Supervision Transfers Transfers: Sit to Stand;Stand to Sit Sit to Stand: 4: Min guard;From bed;From toilet Stand to Sit: 4: Min guard;To bed;To toilet Details for Transfer Assistance: Min guard for safety.    Exercises  Shoulder Exercises Pendulum Exercise: AAROM;Left;Standing Shoulder Flexion: AAROM;Left;5 reps;Supine Shoulder External Rotation: AAROM;Left;5 reps;Supine Wrist  Flexion: AROM;Left;10 reps;Seated Wrist Extension: AROM;Left;10 reps;Seated Digit Composite Flexion:  AROM;Left;10 reps;Seated Composite Extension: AROM;Left;10 reps;Seated Donning/doffing sling/immobilizer: Minimal assistance Correct positioning of sling/immobilizer: Moderate assistance Pendulum exercises (written home exercise program): Moderate assistance (moved pt's hips with pendulums) ROM for elbow, wrist and digits of operated UE: Supervision/safety (demonstrated exercise for pt and he was able to perform) Positioning of UE while sleeping:  (had pillows positioning in L shape for pt)   Balance     End of Session OT - End of Session Equipment Utilized During Treatment: Gait belt;Other (comment) (shoulder sling) Activity Tolerance: Patient tolerated treatment well;Patient limited by pain Patient left: in bed;with call bell/phone within reach Nurse Communication: Mobility status;Other (comment) (not ready for d/c)  GO     Earlie Raveling OTR/L [phone] 09/21/2013, 6:16 PM

## 2013-09-21 NOTE — Progress Notes (Signed)
CSW (Clinical Child psychotherapist) informed by pt nurse that pt does not have transportation home. CSW spoke with pt and determined that pt would be safest taking a taxi home. CSW provided pt nurse with taxi voucher and phone number for [facility].  Eshawn Coor, LCSWA [phone]

## 2013-09-21 NOTE — Progress Notes (Signed)
[name] PROGRESS NOTE  Bonnie Roig NWG:[id number] DOB: 1956/05/28 DOA: 09/16/2013 PCP: Randal Buba, MD  Assessment/Plan: Fracture dislocation of left shoulder joint: Management as per orthopedic surgery. Likely need SNF.   HYPERLIPIDEMIA: Stable. Continue statin   HYPERTENSION: Stable. Continue ACE inhibitor and beta blocker.   SEIZURE DISORDER, HX OF: Stable. On Dilantin. Dilantin level at 10, normal.   Constipation, chronic: continue with laxatives. Add docusate/senna.   Somnolence, Encephalopathy: Likely polypharmacy. Resolved after stopping sedatives(xanax, muscle relaxants). I have change xanax on discharge medications to PRN. Please hold this medication if sedation occurs. Need to be careful with opioids.   Schizoaffective disorder: Continue Seroquel, holding doxepin for now.  Recommend SNF.   HPI/Subjective: Awake, alert, no distress. He is declining SNF. I discussed with him importance of going to SNF. He is worry about his cat.   Objective: Filed Vitals:   09/21/13 0542  BP: 105/66  Pulse: 76  Temp: 97.6 F (36.4 C)  Resp: 20    Intake/Output Summary (Last 24 hours) at 09/21/13 1215 Last data filed at 09/21/13 0547  Gross per 24 hour  Intake    360 ml  Output   1850 ml  Net  -1490 ml   Filed Weights   09/16/13 1720 09/16/13 2355 09/18/13 2100  Weight: 86.6 kg (190 lb 14.7 oz) 86.183 kg (190 lb) 93.8 kg (206 lb 12.7 oz)    Exam:   General:  Awake, alert, in no distress.   Cardiovascular: S 1, S 2 RRR  Respiratory: CTA  Abdomen: BS present, soft, nt  Musculoskeletal dressing left shoulder.   Data Reviewed: Basic Metabolic Panel:  Recent Labs Lab 09/16/13 1610 09/17/13 0432 09/19/13 0605 09/19/13 1250 09/20/13 0945  NA 131* 133* 142 138 137  K 4.1 3.8 4.1 3.9 3.7  CL 96 101 105 103 103  CO2 24 23 23 25 24   GLUCOSE 133* 117* 100* 141* 196*  BUN 17 19 11 9 13   CREATININE 0.84 0.98 0.65 0.69 0.85  CALCIUM 9.6 8.3* 8.3* 8.5  8.2*   Liver Function Tests:  Recent Labs Lab 09/17/13 0432 09/19/13 1250 09/20/13 0945  AST 25 26 17   ALT 19 17 13   ALKPHOS 85 80 63  BILITOT 0.4 0.2* 0.2*  PROT 6.1 6.3 5.4*  ALBUMIN 3.3* 3.0* 2.5*   No results found for this basename: LIPASE, AMYLASE,  in the last 168 hours  Recent Labs Lab 09/20/13 1657  AMMONIA 35   CBC:  Recent Labs Lab 09/16/13 1610 09/19/13 0605 09/19/13 1250 09/20/13 0945  WBC 15.1*  --  8.5 9.5  NEUTROABS 13.6*  --   --  7.3  HGB 15.6 11.5* 11.6* 10.2*  HCT 43.6 32.2* 33.5* 29.0*  MCV 93.0  --  94.9 95.1  PLT 244  --  157 156   Cardiac Enzymes: No results found for this basename: CKTOTAL, CKMB, CKMBINDEX, TROPONINI,  in the last 168 hours BNP (last 3 results) No results found for this basename: PROBNP,  in the last 8760 hours CBG:  Recent Labs Lab 09/19/13 0649  GLUCAP 103*    Recent Results (from the past 240 hour(s))  SURGICAL PCR SCREEN     Status: None   Collection Time    09/17/13 10:59 PM      Result Value Range Status   MRSA, PCR NEGATIVE  NEGATIVE Final   Staphylococcus aureus NEGATIVE  NEGATIVE Final   Comment:            The Xpert  SA Assay (FDA     approved for NASAL specimens     in patients over 65 years of age),     is one component of     a comprehensive surveillance     program.  Test performance has     been validated by [employer] for patients greater     than or equal to 61 year old.     It is not intended     to diagnose infection nor to     guide or monitor treatment.     Studies: Dg Chest 2 View  09/20/2013   *RADIOLOGY REPORT*  Clinical Data: Confusion, evaluate left lower lobe infiltrate, recent history of left shoulder surgery, subsequent encounter.  CHEST - 2 VIEW  Comparison: 09/19/2013; 09/18/2013; 12/08/2012  Findings: Grossly unchanged enlarged cardiac silhouette and mediastinal contours given persistently reduced lung volumes.  Ill- defined heterogeneous opacities within the  bilateral mid and lower lungs are grossly unchanged, left greater than right.  No new focal airspace opacities.  No definite pleural effusion or pneumothorax. Grossly unchanged bones including sequela of left-sided head humeral arthroplasty, incompletely evaluated.  Skin staples overlying the left shoulder operative site.  IMPRESSION: Grossly unchanged findings of hypoventilation and bibasilar opacities, left greater than right, similarly remote prior examinations and favored to represent atelectasis or scar.   Original Report Authenticated By: Tacey Ruiz, MD    Scheduled Meds: . aspirin EC  81 mg Oral QHS  . lisinopril  5 mg Oral Daily  . metoprolol succinate  150 mg Oral QPM  . pantoprazole  40 mg Oral Daily  . phenytoin  200 mg Oral QHS  . QUEtiapine  900 mg Oral QHS  . simvastatin  40 mg Oral QPM  . [START ON 09/23/2013] testosterone cypionate  250 mg Intramuscular Q14 Days   Continuous Infusions: . lactated ringers 50 mL/hr at 09/19/13 1633    Principal Problem:   Fracture dislocation of left shoulder joint Active Problems:   HYPERLIPIDEMIA   HYPERTENSION   SEIZURE DISORDER, HX OF   Constipation, chronic   Somnolence   Schizoaffective disorder    Time spent: 25 minutes.     Librado Guandique  [name] Pager [phone]. If 7PM-7AM, please contact night-coverage at [email or web address], password [facility] 09/21/2013, 12:15 PM  LOS: 5 days

## 2013-09-21 NOTE — Progress Notes (Signed)
   Subjective: 3 Days Post-Op Procedure(s) (LRB): LEFT SHOULDER HEMI-ARTHROPLASTY (Left)  Pt resting soundly after therapy this morning Left shoulder remains in sling Therapy recommending SNF Patient reports pain as mild.  Objective:   VITALS:   Filed Vitals:   09/21/13 0542  BP: 105/66  Pulse: 76  Temp: 97.6 F (36.4 C)  Resp: 20    Left shoulder incision healing well nv intact distally No rashes or edema  LABS  Recent Labs  09/19/13 0605 09/19/13 1250 09/20/13 0945  HGB 11.5* 11.6* 10.2*  HCT 32.2* 33.5* 29.0*  WBC  --  8.5 9.5  PLT  --  157 156     Recent Labs  09/19/13 0605 09/19/13 1250 09/20/13 0945  NA 142 138 137  K 4.1 3.9 3.7  BUN 11 9 13   CREATININE 0.65 0.69 0.85  GLUCOSE 100* 141* 196*     Assessment/Plan: 3 Days Post-Op Procedure(s) (LRB): LEFT SHOULDER HEMI-ARTHROPLASTY (Left) D/c planning to snf F/u in 2 weeks with Dr. Ranell Patrick Continue PT/OT    Alphonsa Overall, MPAS, PA-C  09/21/2013, 10:37 AM

## 2013-09-21 NOTE — Discharge Summary (Signed)
Physician Discharge Summary   Patient ID: [name] MRN: [id number] DOB/AGE: 04/01/1956 57 y.o.  Admit date: 09/16/2013 Discharge date: 09/21/2013  Admission Diagnoses:  Principal Problem:   Fracture dislocation of left shoulder joint Active Problems:   HYPERLIPIDEMIA   HYPERTENSION   SEIZURE DISORDER, HX OF   Constipation, chronic   Somnolence   Schizoaffective disorder   Discharge Diagnoses:  Same   Surgeries: Procedure(s): LEFT SHOULDER HEMI-ARTHROPLASTY on 09/16/2013 - 09/18/2013   Consultants: Internal medicine, PT and OT  Discharged Condition: Stable  Hospital Course: [name] is an 57 y.o. male who was admitted 09/16/2013 with a chief complaint of  Chief Complaint  Patient presents with  . Shoulder Pain  , and found to have a diagnosis of Fracture dislocation of left shoulder joint.  They were brought to the operating room on 09/16/2013 - 09/18/2013 and underwent the above named procedures.    The patient had an uncomplicated hospital course and was stable for discharge.  Recent vital signs:  Filed Vitals:   09/21/13 0542  BP: 105/66  Pulse: 76  Temp: 97.6 F (36.4 C)  Resp: 20    Recent laboratory studies:  Results for orders placed during the hospital encounter of 09/16/13  SURGICAL PCR SCREEN      Result Value Range   MRSA, PCR NEGATIVE  NEGATIVE   Staphylococcus aureus NEGATIVE  NEGATIVE  CBC WITH DIFFERENTIAL      Result Value Range   WBC 15.1 (*) 4.0 - 10.5 K/uL   RBC 4.69  4.22 - 5.81 MIL/uL   Hemoglobin 15.6  13.0 - 17.0 g/dL   HCT 82.9  56.2 - 13.0 %   MCV 93.0  78.0 - 100.0 fL   MCH 33.3  26.0 - 34.0 pg   MCHC 35.8  30.0 - 36.0 g/dL   RDW 86.5  78.4 - 69.6 %   Platelets 244  150 - 400 K/uL   Neutrophils Relative % 90 (*) 43 - 77 %   Neutro Abs 13.6 (*) 1.7 - 7.7 K/uL   Lymphocytes Relative 5 (*) 12 - 46 %   Lymphs Abs 0.8  0.7 - 4.0 K/uL   Monocytes Relative 5  3 - 12 %   Monocytes Absolute 0.7  0.1 - 1.0 K/uL   Eosinophils Relative 0  0 - 5 %   Eosinophils Absolute 0.0  0.0 - 0.7 K/uL   Basophils Relative 0  0 - 1 %   Basophils Absolute 0.0  0.0 - 0.1 K/uL  BASIC METABOLIC PANEL      Result Value Range   Sodium 131 (*) 135 - 145 mEq/L   Potassium 4.1  3.5 - 5.1 mEq/L   Chloride 96  96 - 112 mEq/L   CO2 24  19 - 32 mEq/L   Glucose, Bld 133 (*) 70 - 99 mg/dL   BUN 17  6 - 23 mg/dL   Creatinine, Ser 2.95  0.50 - 1.35 mg/dL   Calcium 9.6  8.4 - 28.4 mg/dL   GFR calc non Af Amer >90  >90 mL/min   GFR calc Af Amer >90  >90 mL/min  COMPREHENSIVE METABOLIC PANEL      Result Value Range   Sodium 133 (*) 135 - 145 mEq/L   Potassium 3.8  3.5 - 5.1 mEq/L   Chloride 101  96 - 112 mEq/L   CO2 23  19 - 32 mEq/L   Glucose, Bld 117 (*) 70 - 99 mg/dL   BUN 19  6 - 23 mg/dL   Creatinine, Ser 1.61  0.50 - 1.35 mg/dL   Calcium 8.3 (*) 8.4 - 10.5 mg/dL   Total Protein 6.1  6.0 - 8.3 g/dL   Albumin 3.3 (*) 3.5 - 5.2 g/dL   AST 25  0 - 37 U/L   ALT 19  0 - 53 U/L   Alkaline Phosphatase 85  39 - 117 U/L   Total Bilirubin 0.4  0.3 - 1.2 mg/dL   GFR calc non Af Amer 88 (*) >90 mL/min   GFR calc Af Amer >90  >90 mL/min  PHENYTOIN LEVEL, TOTAL      Result Value Range   Phenytoin Lvl 10.0  10.0 - 20.0 ug/mL  HEMOGLOBIN AND HEMATOCRIT, BLOOD      Result Value Range   Hemoglobin 11.5 (*) 13.0 - 17.0 g/dL   HCT 09.6 (*) 04.5 - 40.9 %  BASIC METABOLIC PANEL      Result Value Range   Sodium 142  135 - 145 mEq/L   Potassium 4.1  3.5 - 5.1 mEq/L   Chloride 105  96 - 112 mEq/L   CO2 23  19 - 32 mEq/L   Glucose, Bld 100 (*) 70 - 99 mg/dL   BUN 11  6 - 23 mg/dL   Creatinine, Ser 8.11  0.50 - 1.35 mg/dL   Calcium 8.3 (*) 8.4 - 10.5 mg/dL   GFR calc non Af Amer >90  >90 mL/min   GFR calc Af Amer >90  >90 mL/min  GLUCOSE, CAPILLARY      Result Value Range   Glucose-Capillary 103 (*) 70 - 99 mg/dL  CBC      Result Value Range   WBC 8.5  4.0 - 10.5 K/uL   RBC 3.53 (*) 4.22 - 5.81 MIL/uL   Hemoglobin 11.6 (*)  13.0 - 17.0 g/dL   HCT 91.4 (*) 78.2 - 95.6 %   MCV 94.9  78.0 - 100.0 fL   MCH 32.9  26.0 - 34.0 pg   MCHC 34.6  30.0 - 36.0 g/dL   RDW 21.3  08.6 - 57.8 %   Platelets 157  150 - 400 K/uL  COMPREHENSIVE METABOLIC PANEL      Result Value Range   Sodium 138  135 - 145 mEq/L   Potassium 3.9  3.5 - 5.1 mEq/L   Chloride 103  96 - 112 mEq/L   CO2 25  19 - 32 mEq/L   Glucose, Bld 141 (*) 70 - 99 mg/dL   BUN 9  6 - 23 mg/dL   Creatinine, Ser 4.69  0.50 - 1.35 mg/dL   Calcium 8.5  8.4 - 62.9 mg/dL   Total Protein 6.3  6.0 - 8.3 g/dL   Albumin 3.0 (*) 3.5 - 5.2 g/dL   AST 26  0 - 37 U/L   ALT 17  0 - 53 U/L   Alkaline Phosphatase 80  39 - 117 U/L   Total Bilirubin 0.2 (*) 0.3 - 1.2 mg/dL   GFR calc non Af Amer >90  >90 mL/min   GFR calc Af Amer >90  >90 mL/min  CBC WITH DIFFERENTIAL      Result Value Range   WBC 9.5  4.0 - 10.5 K/uL   RBC 3.05 (*) 4.22 - 5.81 MIL/uL   Hemoglobin 10.2 (*) 13.0 - 17.0 g/dL   HCT 52.8 (*) 41.3 - 24.4 %   MCV 95.1  78.0 - 100.0 fL   MCH 33.4  26.0 -  34.0 pg   MCHC 35.2  30.0 - 36.0 g/dL   RDW 16.1  09.6 - 04.5 %   Platelets 156  150 - 400 K/uL   Neutrophils Relative % 77  43 - 77 %   Neutro Abs 7.3  1.7 - 7.7 K/uL   Lymphocytes Relative 15  12 - 46 %   Lymphs Abs 1.5  0.7 - 4.0 K/uL   Monocytes Relative 4  3 - 12 %   Monocytes Absolute 0.4  0.1 - 1.0 K/uL   Eosinophils Relative 3  0 - 5 %   Eosinophils Absolute 0.3  0.0 - 0.7 K/uL   Basophils Relative 0  0 - 1 %   Basophils Absolute 0.0  0.0 - 0.1 K/uL  COMPREHENSIVE METABOLIC PANEL      Result Value Range   Sodium 137  135 - 145 mEq/L   Potassium 3.7  3.5 - 5.1 mEq/L   Chloride 103  96 - 112 mEq/L   CO2 24  19 - 32 mEq/L   Glucose, Bld 196 (*) 70 - 99 mg/dL   BUN 13  6 - 23 mg/dL   Creatinine, Ser 4.09  0.50 - 1.35 mg/dL   Calcium 8.2 (*) 8.4 - 10.5 mg/dL   Total Protein 5.4 (*) 6.0 - 8.3 g/dL   Albumin 2.5 (*) 3.5 - 5.2 g/dL   AST 17  0 - 37 U/L   ALT 13  0 - 53 U/L   Alkaline  Phosphatase 63  39 - 117 U/L   Total Bilirubin 0.2 (*) 0.3 - 1.2 mg/dL   GFR calc non Af Amer >90  >90 mL/min   GFR calc Af Amer >90  >90 mL/min  AMMONIA      Result Value Range   Ammonia 35  11 - 60 umol/L  PHENYTOIN LEVEL, TOTAL      Result Value Range   Phenytoin Lvl 10.1  10.0 - 20.0 ug/mL  BLOOD GAS, ARTERIAL      Result Value Range   FIO2 0.21     pH, Arterial 7.440  7.350 - 7.450   pCO2 arterial 37.8  35.0 - 45.0 mmHg   pO2, Arterial 64.0 (*) 80.0 - 100.0 mmHg   Bicarbonate 25.3 (*) 20.0 - 24.0 mEq/L   TCO2 26.5  0 - 100 mmol/L   Acid-Base Excess 1.5  0.0 - 2.0 mmol/L   O2 Saturation 90.3     Patient temperature 98.6     Collection site RADIAL     Drawn by [id number]     Sample type ARTERIAL     Allens test (pass/fail) PASS  PASS  TYPE AND SCREEN      Result Value Range   ABO/RH(D) A POS     Antibody Screen NEG     Sample Expiration 09/20/2013      Discharge Medications:     Medication List         ALPRAZolam 1 MG tablet  Commonly known as:  XANAX  Take 1 mg by mouth 3 (three) times daily.     aspirin EC 81 MG tablet  Take 81 mg by mouth at bedtime.     calcium carbonate 500 MG chewable tablet  Commonly known as:  TUMS - dosed in mg elemental calcium  Chew 1 tablet by mouth daily as needed.     clonazePAM 2 MG tablet  Commonly known as:  KLONOPIN  Take 2.5 tablets (5 mg total) by mouth 2 (two) times  daily.     doxepin 150 MG capsule  Commonly known as:  SINEQUAN  Take 900 mg by mouth at bedtime.     HYDROcodone-acetaminophen 5-325 MG per tablet  Commonly known as:  NORCO/VICODIN  Take 1-2 tablets by mouth every 4 (four) hours as needed.     lisinopril 5 MG tablet  Commonly known as:  PRINIVIL,ZESTRIL  Take 1 tablet (5 mg total) by mouth daily.     metoprolol succinate 50 MG 24 hr tablet  Commonly known as:  TOPROL-XL  Take 3 tablets (150 mg total) by mouth every evening. Take with or immediately following a meal.     omeprazole 20 MG capsule    Commonly known as:  PRILOSEC  Take 20 mg by mouth daily as needed. For heartburn or acid reflux.     phenytoin 50 MG tablet  Commonly known as:  DILANTIN  Chew 4 tablets (200 mg total) by mouth at bedtime.     polyethylene glycol packet  Commonly known as:  MIRALAX / GLYCOLAX  Take 17 g by mouth daily as needed. For constipation. Mix into 8 ounces of fluid & drink     QUEtiapine 300 MG tablet  Commonly known as:  SEROQUEL  Take 900 mg by mouth at bedtime.     simvastatin 40 MG tablet  Commonly known as:  ZOCOR  Take 40 mg by mouth every evening.     testosterone cypionate 100 MG/ML injection  Commonly known as:  DEPOTESTOTERONE CYPIONATE  Inject 2.5 mLs (250 mg total) into the muscle every 14 (fourteen) days.        Diagnostic Studies: Dg Chest 2 View  09/20/2013   *RADIOLOGY REPORT*  Clinical Data: Confusion, evaluate left lower lobe infiltrate, recent history of left shoulder surgery, subsequent encounter.  CHEST - 2 VIEW  Comparison: 09/19/2013; 09/18/2013; 12/08/2012  Findings: Grossly unchanged enlarged cardiac silhouette and mediastinal contours given persistently reduced lung volumes.  Ill- defined heterogeneous opacities within the bilateral mid and lower lungs are grossly unchanged, left greater than right.  No new focal airspace opacities.  No definite pleural effusion or pneumothorax. Grossly unchanged bones including sequela of left-sided head humeral arthroplasty, incompletely evaluated.  Skin staples overlying the left shoulder operative site.  IMPRESSION: Grossly unchanged findings of hypoventilation and bibasilar opacities, left greater than right, similarly remote prior examinations and favored to represent atelectasis or scar.   Original Report Authenticated By: Tacey Ruiz, MD   Dg Shoulder 1v Left  09/16/2013   *RADIOLOGY REPORT*  Clinical Data: Dislocated left shoulder, attempted closed reduction and O R  LEFT SHOULDER - 1 VIEW  Comparison: Prior radiograph from  09/16/2013  Findings: There is an acute fracture traversing the left humeral head and neck, increased displacement status post attempted reduction.  The humeral head is dislocated superiorly relative to the glenoid.  AC joint remains approximated.  IMPRESSION: Further displacement of the left humeral head and neck fracture status post attempted reduction.  The humeral head remains dislocated posteriorly and superiorly relative to the glenoid.   Original Report Authenticated By: Rise Mu, M.D.   Ct Shoulder Left Wo Contrast  09/17/2013   CLINICAL DATA:  Left humerus fracture.  EXAM: CT OF THE LEFT SHOULDER WITHOUT CONTRAST  TECHNIQUE: Multidetector CT imaging was performed according to the standard protocol. Multiplanar CT image reconstructions were also generated.  COMPARISON:  Radiographs dated 09/16/2013  FINDINGS: There is a comminuted fracture of the humeral head. The largest fragment of the humeral  head is dislocated posteriorly and "locked" behind the inferior rim of the glenoid. Fracture fragments are slightly impacted as well. Hemarthrosis.  The fracture involves the greater and lesser trochanters.  The glenoid and the remainder the scapula are intact. Clavicle is intact.  IMPRESSION: 1. Comminuted impacted displaced fracture of the humeral head. The majority of the humeral head is dislocated posterior to the glenoid the largest fragment locked by in the posterior glenoid rim.   Electronically Signed   By: Geanie Cooley   On: 09/17/2013 09:07   Dg Chest Port 1 View  09/19/2013   CLINICAL DATA:  Shortness of breath  EXAM: PORTABLE CHEST - 1 VIEW  COMPARISON:  Chest x-ray from 1 day prior  FINDINGS: Low volume lungs with new left base opacity, obscuring the diaphragm. Chronic scarring in the right mid lower lung. No acute change in heart size and mediastinal contours. Interval left glenohumeral arthroplasty. Gaseous distension of splenic flexure.  IMPRESSION: 1. New left base atelectasis or  aspiration pneumonitis. 2. Low volume lungs with right lung scarring.   Electronically Signed   By: Tiburcio Pea M.D.   On: 09/19/2013 00:23   Dg Chest Port 1 View  09/18/2013   CLINICAL DATA:  Preop for shoulder surgery.  EXAM: PORTABLE CHEST - 1 VIEW  COMPARISON:  12/08/2012  FINDINGS: Single view of the chest was obtained. Again noted are linear densities in both lungs which are suggestive for scarring and probably some atelectasis. No evidence for airspace disease. The heart and mediastinum are within normal limits and stable. Displaced fracture of the proximal left humerus.  IMPRESSION: Bilateral scarring and atelectasis. No evidence for airspace disease.  Fracture of the proximal left humerus.   Electronically Signed   By: Richarda Overlie M.D.   On: 09/18/2013 08:07   Dg Shoulder Left  09/16/2013   CLINICAL DATA:  Left shoulder pain, no known injury  EXAM: LEFT SHOULDER - 2+ VIEW  COMPARISON:  None.  FINDINGS: Posterior shoulder dislocation on the Y-view.  Suspected impaction fracture of the greater tuberosity.  Visualized left lung is clear.  IMPRESSION: Posterior shoulder dislocation.  Suspected impaction fracture of the greater tuberosity.   Electronically Signed   By: Charline Bills M.D.   On: 09/16/2013 16:46   Dg Shoulder Left Port  09/18/2013   CLINICAL DATA:  Status post left shoulder arthroplasty.  EXAM: PORTABLE LEFT SHOULDER - 2+ VIEW  COMPARISON:  None.  FINDINGS: Frontal projection shows normal alignment of a left shoulder hemiarthroplasty.  IMPRESSION: Normal alignment status post left shoulder arthroplasty.   Electronically Signed   By: Irish Lack M.D.   On: 09/18/2013 20:42    Disposition: 01-Home or Self Care      Discharge Orders   Future Orders Complete By Expires   Call MD / Call 911  As directed    Comments:     If you experience chest pain or shortness of breath, CALL 911 and be transported to the hospital emergency room.  If you develope a fever above 101 F, pus  (white drainage) or increased drainage or redness at the wound, or calf pain, call your surgeon's office.   Constipation Prevention  As directed    Comments:     Drink plenty of fluids.  Prune juice may be helpful.  You may use a stool softener, such as Colace (over the counter) 100 mg twice a day.  Use MiraLax (over the counter) for constipation as needed.   Diet -  low sodium heart healthy  As directed    Increase activity slowly as tolerated  As directed          Signed: Karmello Abercrombie B 09/21/2013, 10:41 AM

## 2013-09-21 NOTE — Progress Notes (Addendum)
Occupational Therapy Treatment Patient Details Name: Lafayette Dunlevy MRN: [id number] DOB: [birth year] Today's Date: 09/21/2013 Time: 4098-1191 OT Time Calculation (min): 27 min  OT Assessment / Plan / Recommendation  History of present illness Pt is 57 y/o male admitted for fall resulting in left humerus fx with s/p left TSA.     OT comments  Reviewed information on shoulder handout. Practiced toilet transfer and provided sling education. Pt unsteady on feet. Recommending SNF for d/c as pt is not safe to d/c home alone.   Follow Up Recommendations  SNF    Barriers to Discharge       Equipment Recommendations  None recommended by OT    Recommendations for Other Services PT consult;Other (comment) (SW consult)  Frequency Min 3X/week   Progress towards OT Goals Progress towards OT goals: Progressing toward goals  Plan Discharge plan remains appropriate    Precautions / Restrictions Precautions Precautions: Shoulder;Fall Type of Shoulder Precautions: TSA protocla Shoulder Interventions: Shoulder sling/immobilizer;Off for dressing/bathing/exercises Precaution Booklet Issued: Yes (comment) Precaution Comments: Due to apparent cognitive deficits, rec sling on at all times with the exception of ADL and excercise. Required Braces or Orthoses: Sling Restrictions Weight Bearing Restrictions: Yes LUE Weight Bearing: Non weight bearing   Pertinent Vitals/Pain Pain in LUE, but not rated. Repositioned.     ADL  Upper Body Dressing:  (educated/demonstrated sling education) Where Assessed - Upper Body Dressing: Unsupported sitting Lower Body Dressing: Supervision/safety (socks) Where Assessed - Lower Body Dressing: Unsupported sitting Toilet Transfer: Performed;Min guard Toilet Transfer Method: Sit to Barista: Comfort height toilet;Grab bars Toileting - Clothing Manipulation and Hygiene: Minimal assistance Where Assessed - Engineer, mining and  Hygiene: Standing Equipment Used: Gait belt;Other (comment) (shoulder sling) Transfers/Ambulation Related to ADLs: Min guard  ADL Comments: Reviewed information on shoulder handout sheet. Pt with decreased memory.  Educated on sling and positioning of it.  Educated not to perform shower transfer until [employer] addresses this and to sit to sponge bathe.    OT Diagnosis:    OT Problem List:   OT Treatment Interventions:     OT Goals(current goals can now be found in the care plan section) Acute Rehab OT Goals Patient Stated Goal: not stated OT Goal Formulation: Patient unable to participate in goal setting Time For Goal Achievement: 10/03/13 Potential to Achieve Goals: Good ADL Goals Pt Will Perform Upper Body Bathing: with supervision;sitting Pt Will Perform Upper Body Dressing: with supervision;sitting Pt/caregiver will Perform Home Exercise Program: With Supervision;With written HEP provided;Increased ROM;Left upper extremity (Norris protocol) Additional ADL Goal #1: S with donning/doffing sling Additional ADL Goal #2: S with positioing LUE in bed and sitting  Visit Information  Last OT Received On: 09/21/13 Assistance Needed: +1 History of Present Illness: Pt is 57 y/o male admitted for fall resulting in left humerus fx with s/p left TSA.      Subjective Data      Prior Functioning       Cognition  Cognition Arousal/Alertness: Awake/alert Behavior During Therapy: WFL for tasks assessed/performed Overall Cognitive Status: No family/caregiver present to determine baseline cognitive functioning Memory: Decreased recall of precautions;Decreased short-term memory    Mobility  Bed Mobility Bed Mobility: Supine to Sit Supine to Sit: 5: Supervision Transfers Transfers: Sit to Stand;Stand to Sit Sit to Stand: 4: Min guard;From bed;From toilet Stand to Sit: 4: Min guard;To bed;To toilet Details for Transfer Assistance: Min guard for safety.    Exercises  Donning/doffing shirt  without moving shoulder:  (  educated on technique-able to verbalize dressing technique ) Method for sponge bathing under operated UE:  (educated on technique-cues not to bring arm out to side) Donning/doffing sling/immobilizer:  (educated/demonstrated this) Correct positioning of sling/immobilizer: Maximal assistance Sling wearing schedule (on at all times/off for ADL's):  (Educated on this) Proper positioning of operated UE when showering:  (educated on this and pt able to demonstrate by side-cues not to abduct arm) Positioning of UE while sleeping:  (educated-unable to recall when asked)   Balance     End of Session OT - End of Session Equipment Utilized During Treatment: Gait belt (shoulder sling) Activity Tolerance: Patient tolerated treatment well Patient left: in bed;with call bell/phone within reach;Other (comment) (nurse going in and out of room) Nurse Communication: Mobility status  GO     Earlie Raveling OTR/L [phone] 09/21/2013, 6:01 PM

## 2013-09-21 NOTE — Progress Notes (Signed)
CSW (Clinical Child psychotherapist) spoke with MD regarding pt declining SNF. CSW explained to MD that concerns about pt safety were shared with pt but pt is still unagreeable. MD reported pt can dc home since he is refusing SNF. RNCM notified. CSW signing off.  Zakiya Sporrer, LCSWA [phone]

## 2013-09-21 NOTE — Progress Notes (Signed)
Clinical Social Work Department BRIEF PSYCHOSOCIAL ASSESSMENT 09/21/2013  Patient:  [name], [name]     Account Number:  [id number]     Admit date:  09/16/2013  Clinical Social Worker:  Harless Nakayama  Date/Time:  09/21/2013 11:30 AM  Referred by:  Physician  Date Referred:  09/21/2013 Referred for  SNF Placement   Other Referral:   Interview type:  Patient Other interview type:   Spoke with pt and pt friend over the phone    PSYCHOSOCIAL DATA Living Status:  ALONE Admitted from facility:   Level of care:   Primary support name:  Lamonte Richer [phone] Primary support relationship to patient:  FRIEND Degree of support available:   Pt reports having very limited support system available. Pt friend unaware of other suppor systems.    CURRENT CONCERNS Current Concerns  Post-Acute Placement   Other Concerns:    SOCIAL WORK ASSESSMENT / PLAN CSW informed that PT is recommending SNF for dc plan. CSW noted that pt is recorded as still being disoriented time and situation. CSW spoke with pt friend, Arvin, who was willing to sign pt into facility if needed. CSW spoke with pt about PT recommendation and pt is agreeable to SNF search for [place]   Assessment/plan status:  Psychosocial Support/Ongoing Assessment of Needs Other assessment/ plan:   Information/referral to community resources:   SNF list denied    PATIENT'S/FAMILY'S RESPONSE TO PLAN OF CARE: Pt is agreeable to SNF.       Sisto Granillo, LCSWA [phone]

## 2013-09-22 ENCOUNTER — Encounter (HOSPITAL_COMMUNITY): Payer: Self-pay | Admitting: Orthopedic Surgery

## 2013-09-22 NOTE — Clinical Social Work Note (Addendum)
CSW met with pt at bedside after RN discussing pt's request for SNF placement. Pt informed CSW that he was not particularly interested in SNF placement, but would do what he needed to in order to recover quicker. CSW informed pt of Medicaid obligations that pt stay at SNF for 30 days and that the facility may not be localized to [facility]. Pt stated that he would like to know his options in regards to SNF placement prior to discharging home. Pt is aware that MD has cleared him to discharge home with home health PT/OT (set up by CM). CSW has sent pt information to five surrounding counties and personally contacted the following SNF facilities that accept a LOG (letter of guarantee):  [place]: no male LOG beds available [facility]: voicemail left for admissions coordinator [facility] Delorise Shiner): reviewing pt information [facility]: voicemail left for admissions coordinator [facility]: reviewing pt information  CSW will continue to follow and assist with discharge planning needs.  Darlyn Chamber, MSW, LCSWA Clinical Social Work [phone]

## 2013-09-22 NOTE — Progress Notes (Signed)
Occupational Therapy Treatment Patient Details Name: [name] MRN: [id number] DOB: Jul 03, 1956 Today's Date: 09/22/2013 Time: 5784-6962 OT Time Calculation (min): 47 min  OT Assessment / Plan / Recommendation  History of present illness Pt is 57 y/o male admitted for fall resulting in left humerus fx with s/p left TSA.     OT comments  Reviewed shoulder information. Pt with poor recall of information covered. Performed exercises.   Follow Up Recommendations  SNF    Barriers to Discharge       Equipment Recommendations  None recommended by OT    Recommendations for Other Services PT consult;Other (comment) (SW consult)  Frequency Min 3X/week   Progress towards OT Goals Progress towards OT goals: Not progressing toward goals - comment  Plan Discharge plan remains appropriate    Precautions / Restrictions Precautions Precautions: Shoulder;Fall Type of Shoulder Precautions: TSA protocla Shoulder Interventions: Shoulder sling/immobilizer;Off for dressing/bathing/exercises Precaution Booklet Issued: Yes (comment) Precaution Comments: Due to apparent cognitive deficits, rec sling on at all times with the exception of ADL and excercise. Required Braces or Orthoses: Sling Restrictions Weight Bearing Restrictions: Yes LUE Weight Bearing: Non weight bearing   Pertinent Vitals/Pain Pain in shoulder during movement. Increased activity. None at end of session.    ADL  Upper Body Dressing: Moderate assistance Where Assessed - Upper Body Dressing: Unsupported sitting Toilet Transfer: Min guard (OT assisted on Rt side to simulate counter pt has at home) Toilet Transfer Method: Sit to stand Toilet Transfer Equipment: Comfort height toilet Equipment Used: Gait belt;Cane;Other (comment) (shoulder sling) Transfers/Ambulation Related to ADLs: Min guard ADL Comments: Reviewed shoulder handout and information. Pt with poor recall of information OT reviewed with him yesterday. Performed  exercises. Tried walking with a cane. Recommended not getting in tub/shower until [facility] addresses this. Recommended sponge bathing.     OT Diagnosis:    OT Problem List:   OT Treatment Interventions:     OT Goals(current goals can now be found in the care plan section) Acute Rehab OT Goals Patient Stated Goal: not stated OT Goal Formulation: Patient unable to participate in goal setting Time For Goal Achievement: 10/03/13 Potential to Achieve Goals: Good ADL Goals Pt Will Perform Upper Body Bathing: with supervision;sitting Pt Will Perform Upper Body Dressing: with supervision;sitting Pt/caregiver will Perform Home Exercise Program: With Supervision;With written HEP provided;Increased ROM;Left upper extremity (Norris protocol) Additional ADL Goal #1: S with donning/doffing sling Additional ADL Goal #2: S with positioing LUE in bed and sitting  Visit Information  Last OT Received On: 09/22/13 Assistance Needed: +1 History of Present Illness: Pt is 57 y/o male admitted for fall resulting in left humerus fx with s/p left TSA.      Subjective Data      Prior Functioning       Cognition  Cognition Arousal/Alertness: Awake/alert Behavior During Therapy: WFL for tasks assessed/performed Overall Cognitive Status: No family/caregiver present to determine baseline cognitive functioning Area of Impairment: Orientation;Memory;Problem solving;Safety/judgement Orientation Level: Disoriented to;Time Memory: Decreased short-term memory;Decreased recall of precautions Safety/Judgement: Decreased awareness of safety Problem Solving: Slow processing;Requires verbal cues General Comments: Pt with cognitive deficits however unable to determine if baseline due to no family present.  Pt unable to recall or remember room number after several reminders.  Pt disoriented to month stated February.     Mobility  Bed Mobility Bed Mobility: Supine to Sit;Sit to Supine Supine to Sit: 5: Supervision Sit to  Supine: 5: Supervision Details for Bed Mobility Assistance: Supervision for safety and  needed extra time to complete without use of rail. Transfers Transfers: Sit to Stand;Stand to Sit Sit to Stand: 4: Min guard;From bed;From chair/3-in-1;From toilet Stand to Sit: 4: Min guard;To bed;To chair/3-in-1;To toilet Details for Transfer Assistance: Min guard for safety. Cues for precautions.    Exercises  Shoulder Exercises Pendulum Exercise: AAROM;Left;Standing Shoulder Flexion: AAROM;Left;Supine;10 reps Shoulder External Rotation: AAROM;Left;Supine;10 reps Elbow Flexion: AROM;Left;10 reps;Standing;AAROM Elbow Extension: AROM;Left;10 reps;Standing;AAROM Wrist Flexion: AROM;Left;10 reps;Seated Wrist Extension: AROM;Left;10 reps;Seated Digit Composite Flexion: AROM;Left;10 reps;Seated Composite Extension: AROM;Left;10 reps;Seated Donning/doffing shirt without moving shoulder:  (educated-reviewed several times-could not tell me correct technique) Method for sponge bathing under operated UE: Supervision/safety Donning/doffing sling/immobilizer: Moderate assistance Correct positioning of sling/immobilizer: Moderate assistance Pendulum exercises (written home exercise program): Moderate assistance (moved pt's hips with pendulums) ROM for elbow, wrist and digits of operated UE: Supervision/safety (did assist with AAROM elbow flexion) Sling wearing schedule (on at all times/off for ADL's):  (educated on this-told OT he can take it off when sitting and when bathing) Proper positioning of operated UE when showering:  (educated-OT reviewed several times ) Positioning of UE while sleeping:  (educated-reviewed several times-unable to tell me correctly)      End of Session OT - End of Session Equipment Utilized During Treatment: Gait belt;Other (comment) (shoulder sling, cane) Activity Tolerance: Patient tolerated treatment well Patient left: in chair;with call bell/phone within reach;with chair alarm  set  GO     Earlie Raveling  OTR/L [phone]  09/22/2013, 1:05 PM

## 2013-09-22 NOTE — Clinical Social Work Placement (Signed)
Clinical Social Work Department CLINICAL SOCIAL WORK PLACEMENT NOTE 09/22/2013  Patient:  [name], [name]  Account Number:  [id number] Admit date:  09/16/2013  Clinical Social Worker:  Irving Burton [place], LCSWA  Date/time:  09/22/2013 12:09 PM  Clinical Social Work is seeking post-discharge placement for this patient at the following level of care:   SKILLED NURSING   (*CSW will update this form in Epic as items are completed)   09/22/2013  Patient/family provided with [facility] Department of Clinical Social Works list of facilities offering this level of care within the geographic area requested by the patient (or if unable, by the patients family).  09/22/2013  Patient/family informed of their freedom to choose among providers that offer the needed level of care, that participate in Medicare, Medicaid or managed care program needed by the patient, have an available bed and are willing to accept the patient.  09/22/2013  Patient/family informed of [facility] ownership interest in [facility], as well as of the fact that they are under no obligation to receive care at this facility.  PASARR submitted to [employer] on 09/22/2013 PASARR number received from [employer] on 09/22/2013  FL2 transmitted to all facilities in geographic area requested by pt/family on  09/22/2013 FL2 transmitted to all facilities within larger geographic area on 09/22/2013  Patient informed that his/her managed care company has contracts with or will negotiate with  certain facilities, including the following:     Patient/family informed of bed offers received:   Patient chooses bed at  Physician recommends and patient chooses bed at    Patient to be transferred to  on   Patient to be transferred to facility by   The following physician request were entered in Epic:   Additional Comments:  Darlyn Chamber, MSW, LCSWA Clinical Social Work [phone]

## 2013-09-22 NOTE — Progress Notes (Signed)
   Subjective: 4 Days Post-Op Procedure(s) (LRB): LEFT SHOULDER HEMI-ARTHROPLASTY (Left)  Pt c/o minimal pain to left shoulder currently Pt refusing to go to SNF and medicaid will not pay unless he stays 30 days which he would not Ready for d/c home Patient reports pain as mild.  Objective:   VITALS:   Filed Vitals:   09/22/13 0531  BP: 117/70  Pulse: 75  Temp: 99.2 F (37.3 C)  Resp: 20    Left shoulder incision healing well nv intact distally Minimal edema distally  LABS  Recent Labs  09/19/13 1250 09/20/13 0945  HGB 11.6* 10.2*  HCT 33.5* 29.0*  WBC 8.5 9.5  PLT 157 156     Recent Labs  09/19/13 1250 09/20/13 0945  NA 138 137  K 3.9 3.7  BUN 9 13  CREATININE 0.69 0.85  GLUCOSE 141* 196*     Assessment/Plan: 4 Days Post-Op Procedure(s) (LRB): LEFT SHOULDER HEMI-ARTHROPLASTY (Left)  Plan to d/c home today Discussed importance of having someone check on him and having a phone nearby in case F/u in 2 weeks with Dr. Ranell Patrick Home health PT/OT   Alphonsa Overall, MPAS, PA-C  09/22/2013, 7:28 AM

## 2013-09-22 NOTE — Progress Notes (Signed)
Cane delivered by [facility]. Discharge instructions given. Discharged home by taxi. No complaints. VS stable.[name]

## 2013-09-22 NOTE — Progress Notes (Signed)
Patient not seen. Patient was suppose to be discharge yesterday. He has remain clinically stable. SW helping with disposition. Will sign off. Please call with questions. Jerae Izard, Md. [phone]. Or [phone].

## 2013-09-22 NOTE — Progress Notes (Signed)
Physical Therapy Treatment Patient Details Name: Elza Sortor MRN: [id number] DOB: 1956/11/18 Today's Date: 09/22/2013 Time: 0930-1010 PT Time Calculation (min): 40 min  PT Assessment / Plan / Recommendation  History of Present Illness Pt is 57 y/o male admitted for fall resulting in left humerus fx with s/p left TSA.     PT Comments   Pt able to increase ambulation and complete DGI balance assessment.  Pt scored 12/24 on DGI making pt a very high fall risk.  Pt also continues to have cognitive impairments and short term memory deficits as well as safety awareness.  Therefore pt unable to recall how to donn sling or remember room number after told. Spoke with pt and SW in depth re: overall d/c plans.  Highly recommended for pt to go to SNF.  Pt willing to go to SNF if not too far away.  If SNF too far from pt's home then pt would prefer HHPT.  Highly recommend for 24 hour assistance at home due to pt's cognitive impairments and overall high fall risk.     Follow Up Recommendations  SNF  (If must d/c home then HHPT with 24 hour assistance)   Equipment Recommendations  Cane    Frequency Min 3X/week   Progress towards PT Goals Progress towards PT goals: Progressing toward goals  Plan Current plan remains appropriate    Precautions / Restrictions Precautions Precautions: Shoulder;Fall Type of Shoulder Precautions: TSA protocla Shoulder Interventions: Shoulder sling/immobilizer;Off for dressing/bathing/exercises Precaution Booklet Issued: Yes (comment) Required Braces or Orthoses: Sling Restrictions Weight Bearing Restrictions: Yes LUE Weight Bearing: Non weight bearing   Pertinent Vitals/Pain 3/10 left shoulder pain    Mobility  Bed Mobility Bed Mobility: Supine to Sit Sit to Supine: 5: Supervision Details for Bed Mobility Assistance: Supervision for safety and needed extra time to complete without use of rail. Transfers Transfers: Sit to Stand;Stand to Sit Sit to Stand: 4:  Min guard;From bed Stand to Sit: 4: Min guard;To chair/3-in-1 Details for Transfer Assistance: Minguard for safety Ambulation/Gait Ambulation/Gait Assistance: 4: Min assist;4: Min guard Ambulation Distance (Feet): 250 Feet Assistive device: None Ambulation/Gait Assistance Details: (A) to maintain balance with cues for safety; pt with unsteady gait at times and needed (a) to maintain balance Gait Pattern: Step-through pattern;Decreased stride length;Antalgic;Decreased weight shift to right;Trunk rotated posteriorly on right Gait velocity: decreased Stairs: Yes Stairs Assistance: 4: Min assist Stairs Assistance Details (indicate cue type and reason): (A) to maintain balance Stair Management Technique: No rails;Forwards Number of Stairs: 1    Exercises     PT Diagnosis:    PT Problem List:   PT Treatment Interventions:     PT Goals (current goals can now be found in the care plan section) Acute Rehab PT Goals Patient Stated Goal: To find his keys.  PT Goal Formulation: With patient Time For Goal Achievement: 10/04/13 Potential to Achieve Goals: Good  Visit Information  Last PT Received On: 09/22/13 Assistance Needed: +1 History of Present Illness: Pt is 57 y/o male admitted for fall resulting in left humerus fx with s/p left TSA.      Subjective Data  Subjective: "I'll just drive to outpatient." (Pt does not realize is unable to drive.) Patient Stated Goal: To find his keys.    Cognition  Cognition Arousal/Alertness: Awake/alert Behavior During Therapy: WFL for tasks assessed/performed Overall Cognitive Status: No family/caregiver present to determine baseline cognitive functioning Area of Impairment: Orientation;Memory;Safety/judgement;Problem solving Orientation Level: Disoriented to;Time Memory: Decreased recall of precautions;Decreased short-term memory Safety/Judgement:  Decreased awareness of safety Problem Solving: Slow processing;Requires verbal cues General  Comments: Pt with cognitive deficits however unable to determine if baseline due to no family present.  Pt unable to recall or remember room number after several reminders.  Pt disoriented to month stated February.     Balance  Standardized Balance Assessment Standardized Balance Assessment: Dynamic Gait Index Dynamic Gait Index Level Surface: Mild Impairment Change in Gait Speed: Mild Impairment Gait with Horizontal Head Turns: Mild Impairment Gait with Vertical Head Turns: Mild Impairment Gait and Pivot Turn: Severe Impairment Step Over Obstacle: Mild Impairment Step Around Obstacles: Mild Impairment Steps: Severe Impairment Total Score: 12  End of Session PT - End of Session Equipment Utilized During Treatment: Gait belt (sling) Activity Tolerance: Patient tolerated treatment well Patient left: in chair;with call bell/phone within reach;with chair alarm set Nurse Communication: Mobility status   GP     Sameria Morss 09/22/2013, 11:10 AM  Jake Shark, PT DPT [phone]

## 2013-09-25 ENCOUNTER — Emergency Department (HOSPITAL_COMMUNITY): Payer: Medicaid Other

## 2013-09-25 ENCOUNTER — Other Ambulatory Visit: Payer: Self-pay | Admitting: Emergency Medicine

## 2013-09-25 ENCOUNTER — Emergency Department (HOSPITAL_COMMUNITY)
Admission: EM | Admit: 2013-09-25 | Discharge: 2013-09-25 | Disposition: A | Discharge status: Home / Self Care | Payer: Medicaid Other | Attending: Emergency Medicine | Admitting: Emergency Medicine

## 2013-09-25 ENCOUNTER — Encounter (HOSPITAL_COMMUNITY): Payer: Self-pay | Admitting: Emergency Medicine

## 2013-09-25 DIAGNOSIS — F411 Generalized anxiety disorder: Secondary | ICD-10-CM | POA: Insufficient documentation

## 2013-09-25 DIAGNOSIS — F259 Schizoaffective disorder, unspecified: Secondary | ICD-10-CM | POA: Insufficient documentation

## 2013-09-25 DIAGNOSIS — G40909 Epilepsy, unspecified, not intractable, without status epilepticus: Secondary | ICD-10-CM | POA: Insufficient documentation

## 2013-09-25 DIAGNOSIS — F329 Major depressive disorder, single episode, unspecified: Secondary | ICD-10-CM | POA: Insufficient documentation

## 2013-09-25 DIAGNOSIS — I10 Essential (primary) hypertension: Secondary | ICD-10-CM | POA: Insufficient documentation

## 2013-09-25 DIAGNOSIS — E785 Hyperlipidemia, unspecified: Secondary | ICD-10-CM | POA: Insufficient documentation

## 2013-09-25 DIAGNOSIS — Z79899 Other long term (current) drug therapy: Secondary | ICD-10-CM | POA: Insufficient documentation

## 2013-09-25 DIAGNOSIS — N390 Urinary tract infection, site not specified: Secondary | ICD-10-CM | POA: Insufficient documentation

## 2013-09-25 DIAGNOSIS — K219 Gastro-esophageal reflux disease without esophagitis: Secondary | ICD-10-CM | POA: Insufficient documentation

## 2013-09-25 DIAGNOSIS — F3289 Other specified depressive episodes: Secondary | ICD-10-CM | POA: Insufficient documentation

## 2013-09-25 LAB — CG4 I-STAT (LACTIC ACID): Lactic Acid, Venous: 1.36 mmol/L (ref 0.5–2.2)

## 2013-09-25 LAB — COMPREHENSIVE METABOLIC PANEL
ALT: 28 U/L (ref 0–53)
AST: 35 U/L (ref 0–37)
Albumin: 3.6 g/dL (ref 3.5–5.2)
Alkaline Phosphatase: 113 U/L (ref 39–117)
BUN: 20 mg/dL (ref 6–23)
CO2: 22 mEq/L (ref 19–32)
Calcium: 9.1 mg/dL (ref 8.4–10.5)
Chloride: 100 mEq/L (ref 96–112)
Creatinine, Ser: 0.8 mg/dL (ref 0.50–1.35)
GFR calc Af Amer: >90 mL/min (ref >90)
GFR calc non Af Amer: >90 mL/min (ref >90)
Glucose, Bld: 119 mg/dL — ABNORMAL HIGH (ref 70–99)
Potassium: 3.9 mEq/L (ref 3.5–5.1)
Sodium: 137 mEq/L (ref 135–145)
Total Bilirubin: 0.3 mg/dL (ref 0.3–1.2)
Total Protein: 7.4 g/dL (ref 6.0–8.3)

## 2013-09-25 LAB — POCT I-STAT, CHEM 8
BUN: 22 mg/dL (ref 6–23)
Calcium, Ion: 1.17 mmol/L (ref 1.12–1.23)
Chloride: 102 mEq/L (ref 96–112)
Creatinine, Ser: 1.1 mg/dL (ref 0.50–1.35)
Glucose, Bld: 122 mg/dL — ABNORMAL HIGH (ref 70–99)
HCT: 37 % — ABNORMAL LOW (ref 39.0–52.0)
Hemoglobin: 12.6 g/dL — ABNORMAL LOW (ref 13.0–17.0)
Potassium: 3.8 mEq/L (ref 3.5–5.1)
Sodium: 137 mEq/L (ref 135–145)
TCO2: 23 mmol/L (ref 0–100)

## 2013-09-25 LAB — URINALYSIS, ROUTINE W REFLEX MICROSCOPIC
Bilirubin Urine: NEGATIVE
Glucose, UA: NEGATIVE mg/dL
Hgb urine dipstick: NEGATIVE
Ketones, ur: NEGATIVE mg/dL
Leukocytes, UA: NEGATIVE
Nitrite: POSITIVE — AB
Protein, ur: NEGATIVE mg/dL
Specific Gravity, Urine: 1.02 (ref 1.005–1.030)
Urobilinogen, UA: 0.2 mg/dL (ref 0.0–1.0)
pH: 5.5 (ref 5.0–8.0)

## 2013-09-25 LAB — CBC
HCT: 33.6 % — ABNORMAL LOW (ref 39.0–52.0)
Hemoglobin: 12.4 g/dL — ABNORMAL LOW (ref 13.0–17.0)
MCH: 34.4 pg — ABNORMAL HIGH (ref 26.0–34.0)
MCHC: 36.9 g/dL — ABNORMAL HIGH (ref 30.0–36.0)
MCV: 93.3 fL (ref 78.0–100.0)
Platelets: 333 10*3/uL (ref 150–400)
RBC: 3.6 MIL/uL — ABNORMAL LOW (ref 4.22–5.81)
RDW: 13.6 % (ref 11.5–15.5)
WBC: 8.6 10*3/uL (ref 4.0–10.5)

## 2013-09-25 LAB — URINE MICROSCOPIC-ADD ON

## 2013-09-25 LAB — POCT I-STAT TROPONIN I: Troponin i, poc: 0 ng/mL (ref 0.00–0.08)

## 2013-09-25 MED ORDER — CEFPODOXIME PROXETIL 100 MG PO TABS: 100.0000 mg | ORAL_TABLET | Freq: Two times a day (BID) | ORAL | Status: DC

## 2013-09-25 MED ORDER — LIDOCAINE HCL (PF) 1 % IJ SOLN
INTRAMUSCULAR | Status: AC
  Administered 2013-09-25: 2.1 mL
  Filled 2013-09-25: qty 5

## 2013-09-25 MED ORDER — CEFTRIAXONE SODIUM 1 G IJ SOLR
1.0000 g | Freq: Once | INTRAMUSCULAR | Status: AC
  Administered 2013-09-25: 1 g via INTRAMUSCULAR
  Filled 2013-09-25: qty 10

## 2013-09-25 MED ORDER — TESTOSTERONE CYPIONATE 100 MG/ML IM SOLN: 250.0000 mg | INTRAMUSCULAR | Status: DC

## 2013-09-25 MED ORDER — SODIUM CHLORIDE 0.9 % IV BOLUS (SEPSIS)
1000.0000 mL | Freq: Once | INTRAVENOUS | Status: AC
  Administered 2013-09-25: 1000 mL via INTRAVENOUS

## 2013-09-25 NOTE — Consult Note (Addendum)
FMTS Attending Admission Note: [name]  I have discussed this patient with the resident. I agree with the resident's findings, assessment and care plan.

## 2013-09-25 NOTE — Consult Note (Signed)
Family Medicine Teaching Service Emergency Department Consultation Note Service Pager: [phone]  Patient name: [name] Medical record number: [id number] Date of birth: October 22, 1956 Age: 57 y.o. Gender: male  Primary Care Provider: Randal Buba, MD Consultants: None Code Status: Full  Chief Complaint: medical non-adherence  Assessment and Plan: [name] is a 57 y.o. male presenting after taking more BP medication than directed for high blood pressure at home. PMH is significant for HTN, schizoaffective disorder, seizure disorder and recent hospitalization for posterior shoulder dislocation and fracture leading to left hemi-arthroplasty on 09/18/13.   # Medical non-adherence:  - Pt obtains BP readings 4 times per day at home - Advised to take BP only once per day at home and to call clinic if BP is consistently elevated for an appointment to modify his anti-hypertensive medications.  - Also advised that patient may call the [facility] after hours and speak with the on-call physician for advice with future situations like this one.   # Low-normal Blood Pressure  - BP > 100/60 throughout history taking and physical examination.; MAP >65, NSR with HR >60 on monitor. Other VS stable.  - BMP is normal. Troponin negative - Took 100mg  above prescribed 150mg  Toprol-XL without sustained hypotension or signs of hypoperfusion.   # Schizoaffective disorder: - Baseline mental status unknown.  - Pt without current SI or HI, not thought to be a danger to himself or others.  - Worried perseveration on left shoulder getting frozen, but not tangential.   # Asymptomatic bacteruria - Urinalysis with positive nitrite and many bacteria. Urine culture pending.  - No treatment without symptoms.   Disposition: D/C from ED to home with home health RN to see later this AM  History of Present Illness: [name] is a 57 y.o. male presenting to the ED after calling EMS for concern  about his shoulder sling and incidentally found to have low blood pressure. He then admitted to taking more than his directed dose of metoprolol.   [name] reports checking his blood pressure yesterday evening and finding it to be high in the 150s/90s. He was very worried about having a stroke, and he then took 250mg  Toprol-XL, though the prescribed dose is 150mg . He has no history of stroke and had no complaint of headache or chest pain. In the ED he was found to be in the low-normal range of blood pressure and with a normal heart rate. He complains of no related symptoms at this time. He denies having any intention of hurting himself.   He was recently discharged on 10/6 after undergoing a left shoulder arthroplasty for shoulder dislocation and suspected impaction fracture of the humerus following a seizure. He is being seen by home health RN and PT. Home health RN is to come to his house this AM. He expresses worry about getting a frozen shoulder and states he called EMS to get help putting a shoulder sling on.   Review Of Systems: Per HPI Otherwise 12 point review of systems was performed and was unremarkable.  Patient Active Problem List   Diagnosis Date Noted  . Fracture dislocation of left shoulder joint 09/20/2013  . Somnolence 09/20/2013  . Schizoaffective disorder 09/20/2013  . Malaise and fatigue 04/21/2013  . Tachycardia 02/05/2013  . Diarrhea 12/15/2012  . Presbyopia 04/25/2012  . Screening for prostate cancer 02/04/2012  . Chest pain 01/30/2012  . Androgen deficiency 09/07/2011  . Constipation, chronic 09/07/2011  . Other specified disorders of liver 12/30/2008  .  Overweight 09/08/2008  . HYPERLIPIDEMIA 01/28/2008  . DEPRESSION, CHRONIC, SEVERE 01/28/2008  . HYPERTENSION 01/28/2008  . SEIZURE DISORDER, HX OF 01/28/2008  . OSTEOPENIA 07/01/2007   Past Medical History: Past Medical History  Diagnosis Date  . Seizures   . Hypertension   . GERD (gastroesophageal reflux  disease)   . Dyslipidemia   . Schizoaffective disorder   . Depression   . Anxiety   . MRSA carrier    Past Surgical History: Past Surgical History  Procedure Laterality Date  . Self orchectomy    . Colonoscopy  2010  . Orif shoulder fracture    . Shoulder closed reduction Left 09/16/2013    Procedure: CLOSED REDUCTION SHOULDER;  Surgeon: Shelda Pal, MD;  Location: [facility] ORS;  Service: Orthopedics;  Laterality: Left;  . Shoulder hemi-arthroplasty Left 09/18/2013    Procedure: LEFT SHOULDER HEMI-ARTHROPLASTY;  Surgeon: Verlee Rossetti, MD;  Location: [facility] OR;  Service: Orthopedics;  Laterality: Left;   Social History: History  Substance Use Topics  . Smoking status: Never Smoker   . Smokeless tobacco: Never Used  . Alcohol Use: No    Please also refer to relevant sections of EMR.  Family History: Family History  Problem Relation Age of Onset  . Stroke Father     Living at 45  . Stroke Mother     Stroke in late 79's. Died in her 48's   Allergies and Medications: Allergies  Allergen Reactions  . Sulfa Antibiotics Nausea And Vomiting   No current facility-administered medications on file prior to encounter.   Current Outpatient Prescriptions on File Prior to Encounter  Medication Sig Dispense Refill  . ALPRAZolam (XANAX) 1 MG tablet Take 1 tablet (1 mg total) by mouth 3 (three) times daily as needed for anxiety.  30 tablet  0  . aspirin EC 81 MG tablet Take 81 mg by mouth at bedtime.       . calcium carbonate (TUMS - DOSED IN MG ELEMENTAL CALCIUM) 500 MG chewable tablet Chew 1 tablet by mouth daily as needed.      . clonazePAM (KLONOPIN) 2 MG tablet Take 2.5 tablets (5 mg total) by mouth 2 (two) times daily.  150 tablet  3  . doxepin (SINEQUAN) 150 MG capsule Take 900 mg by mouth at bedtime.      Marland Kitchen HYDROcodone-acetaminophen (NORCO/VICODIN) 5-325 MG per tablet Take 1-2 tablets by mouth every 4 (four) hours as needed.  60 tablet  0  . lisinopril (PRINIVIL,ZESTRIL) 5 MG tablet  Take 1 tablet (5 mg total) by mouth daily.  90 tablet  3  . metoprolol succinate (TOPROL-XL) 50 MG 24 hr tablet Take 3 tablets (150 mg total) by mouth every evening. Take with or immediately following a meal.  90 tablet  3  . omeprazole (PRILOSEC) 20 MG capsule Take 20 mg by mouth daily as needed. For heartburn or acid reflux.      . phenytoin (DILANTIN) 50 MG tablet Chew 4 tablets (200 mg total) by mouth at bedtime.  120 tablet  6  . polyethylene glycol (MIRALAX / GLYCOLAX) packet Take 17 g by mouth daily as needed. For constipation. Mix into 8 ounces of fluid & drink  30 each  6  . QUEtiapine (SEROQUEL) 300 MG tablet Take 900 mg by mouth at bedtime.       . senna-docusate (SENOKOT-S) 8.6-50 MG per tablet Take 1 tablet by mouth 2 (two) times daily.  30 tablet  0  . simvastatin (ZOCOR) 40  MG tablet Take 40 mg by mouth every evening.      . testosterone cypionate (DEPOTESTOTERONE CYPIONATE) 100 MG/ML injection Inject 2.5 mLs (250 mg total) into the muscle every 14 (fourteen) days.  10 mL  3    Objective: BP 103/64  Pulse 138  Temp(Src) 99.5 F (37.5 C) (Oral)  Resp 15  SpO2 95% Exam: General: 57 year old male laying in bed in NAD HEENT: NCAT, sclerae normal, oropharynx clear Cardiovascular: Regular rate and rhythm, no murmur/rub/gallop, 2+ radial and DP pulses Respiratory: Non-labored, normal rate, CTAB Abdomen: Soft, NT, ND, Normoactive BS Extremities: Very limited active ROM in L shoulder, grip strength 5/5, bandage c/d/i across anterior should, otherwise warm, well perfused, no edema, cap refill <2sec Skin: No rashes, lesions, or masses noted Neuro: Alert and oriented to person, place, and month, speech clear, CN II-XII intact, gait not assessed.  Psych: Appears worried with no flight of ideas or pressured speech, thought process is goal-directed, remote memory deficit, maintains good eye contact, no SI or HI, does not appear to be responding to internal stimuli  Labs and Imaging: CBC  BMET   Recent Labs Lab 09/25/13 0245 09/25/13 0259  WBC 8.6  --   HGB 12.4* 12.6*  HCT 33.6* 37.0*  PLT 333  --     Recent Labs Lab 09/25/13 0245 09/25/13 0259  NA 137 137  K 3.9 3.8  CL 100 102  CO2 22  --   BUN 20 22  CREATININE 0.80 1.10  GLUCOSE 119* 122*  CALCIUM 9.1  --      CT HEAD WITHOUT CONTRAST Findings: There is no evidence of acute infarction, mass lesion, or  intra- or extra-axial hemorrhage on CT.  Prominence of the ventricles and sulci reflects mild cortical  volume loss. Mild cerebellar atrophy is noted.  The brainstem and fourth ventricle are within normal limits. The  basal ganglia are unremarkable in appearance. The cerebral  hemispheres demonstrate grossly normal gray-white differentiation.  No mass effect or midline shift is seen.  There is no evidence of fracture; visualized osseous structures are  unremarkable in appearance. The visualized portions of the orbits  are within normal limits. The paranasal sinuses and mastoid air  cells are well-aerated. No significant soft tissue abnormalities  are seen.  IMPRESSION:  1. No acute intracranial pathology seen on CT.  2. Mild cortical volume loss noted.  CXR Findings: The lungs are mildly hypoexpanded. Mild left basilar  atelectasis is noted. No definite pleural effusion or pneumothorax  is seen.  The cardiomediastinal silhouette is normal in size. No acute  osseous abnormalities are identified. A left shoulder arthroplasty  is incompletely imaged but appears grossly unremarkable. Screws  are noted at the right humeral head.  IMPRESSION:  Lungs mildly hypoexpanded; mild left basilar atelectasis noted.  ECG NSR, normal rate, without AV block.   09/25/2013 04:23  Color, Urine YELLOW  APPearance CLOUDY (A)  Specific Gravity, Urine 1.020  pH 5.5  Glucose NEGATIVE  Bilirubin Urine NEGATIVE  Ketones, ur NEGATIVE  Protein NEGATIVE  Urobilinogen, UA 0.2  Nitrite POSITIVE (A)  Leukocytes,  UA NEGATIVE  Hgb urine dipstick NEGATIVE  WBC, UA 7-10  RBC / HPF 0-2  Squamous Epithelial / LPF RARE  Bacteria, UA MANY (A)   Hazeline Junker, MD 09/25/2013, 5:26 AM PGY-1, [facility] FPTS Intern pager: [phone], text pages welcome  I evaluated this patient with Dr. Jarvis Newcomer and found the patient to be medically stable without indication  for inpatient admission. He was counseled on the proper use of blood pressure monitoring and to call the resident physician on call with any such concerns. He was agreeable to this plan and will not adjust his medication doses without direction from an MD. Regarding his concern about his shoulder, he has outpatient care organized and needs to return home this morning to receive this care. The patient was agreeable with this plan. We discussed the patient's case with Sunnie Nielsen, ED physician, and recommended discharge from the emergency department.   Si Raider Clinton Sawyer, MD, MBA 09/25/2013, 6:16 AM Family Medicine Resident, PGY-3

## 2013-09-25 NOTE — ED Notes (Signed)
Pt refused to go to CT. 

## 2013-09-25 NOTE — ED Notes (Signed)
Pt arrives via EMS from home. Pt called EMS for help putting his sling on. EMS arrives and pt starts talking about depression causing him to be lethargic. EMS reports that pt was stumbling around and repeating questions. Stroke screen, CBG and BP checked and BP found to be low, HR 80-90. Pt reports that he took two doses of his metoprolol today. Pt denies depression to the point of wanting to injure himself.

## 2013-09-25 NOTE — ED Provider Notes (Signed)
CSN: [id number]     Arrival date & time 09/25/13  0225 History   First MD Initiated Contact with Patient 09/25/13 [phone]     Chief Complaint  Patient presents with  . Hypotension   (Consider location/radiation/quality/duration/timing/severity/associated sxs/prior Treatment) The history is provided by the patient.   History provided by EMS: Called out to patient's house for help with his swelling, recent left shoulder surgery. Patient found to be borderline hypotensive and admitted to taking extra blood pressure medications earlier in the evening. He was somewhat confused. He lives alone. Last seen normal unknown. Patient has history of schizoaffective and [facility]. On arrival to emergency department, confusion improved and patient states that he has a history of tachycardia, his heart rate and blood pressure were elevated earlier tonight so an extra blood pressure pill. He denies any suicidal or homicidal ideation. He denies any hallucinations. He has a seizure history with recent posterior shoulder dislocation.  He has had persistent pain, improving since surgery.  Past Medical History  Diagnosis Date  . Seizures   . Hypertension   . GERD (gastroesophageal reflux disease)   . Dyslipidemia   . Schizoaffective disorder   . Depression   . Anxiety   . MRSA carrier    Past Surgical History  Procedure Laterality Date  . Self orchectomy    . Colonoscopy  2010  . Orif shoulder fracture    . Shoulder closed reduction Left 09/16/2013    Procedure: CLOSED REDUCTION SHOULDER;  Surgeon: Shelda Pal, MD;  Location: [facility];  Service: Orthopedics;  Laterality: Left;  . Shoulder hemi-arthroplasty Left 09/18/2013    Procedure: LEFT SHOULDER HEMI-ARTHROPLASTY;  Surgeon: Verlee Rossetti, MD;  [facility]: [facility] OR;  Service: Orthopedics;  Laterality: Left;   Family History  Problem Relation Age of Onset  . Stroke Father     Living at 80  . Stroke Mother     Stroke in late  22's. Died in her 73's   History  Substance Use Topics  . Smoking status: Never Smoker   . Smokeless tobacco: Never Used  . Alcohol Use: No    Review of Systems  Constitutional: Negative for fever and chills.  Eyes: Negative for visual disturbance.  Respiratory: Negative for shortness of breath.   Cardiovascular: Negative for chest pain.  Gastrointestinal: Negative for abdominal pain.  Genitourinary: Negative for dysuria and hematuria.  Musculoskeletal: Negative for back pain, neck pain and neck stiffness.  Skin: Negative for rash.  Neurological: Negative for syncope and headaches.  All other systems reviewed and are negative.    Allergies  Sulfa antibiotics  Home Medications   Current Outpatient Rx  Name  Route  Sig  Dispense  Refill  . ALPRAZolam (XANAX) 1 MG tablet   Oral   Take 1 tablet (1 mg total) by mouth 3 (three) times daily as needed for anxiety.   30 tablet   0   . aspirin EC 81 MG tablet   Oral   Take 81 mg by mouth at bedtime.          . calcium carbonate (TUMS - DOSED IN MG ELEMENTAL CALCIUM) 500 MG chewable tablet   Oral   Chew 1 tablet by mouth daily as needed.         . clonazePAM (KLONOPIN) 2 MG tablet   Oral   Take 2.5 tablets (5 mg total) by mouth 2 (two) times daily.   150 tablet   3   .  doxepin (SINEQUAN) 150 MG capsule   Oral   Take 900 mg by mouth at bedtime.         Marland Kitchen HYDROcodone-acetaminophen (NORCO/VICODIN) 5-325 MG per tablet   Oral   Take 1-2 tablets by mouth every 4 (four) hours as needed.   60 tablet   0   . lisinopril (PRINIVIL,ZESTRIL) 5 MG tablet   Oral   Take 1 tablet (5 mg total) by mouth daily.   90 tablet   3   . metoprolol succinate (TOPROL-XL) 50 MG 24 hr tablet   Oral   Take 3 tablets (150 mg total) by mouth every evening. Take with or immediately following a meal.   90 tablet   3   . omeprazole (PRILOSEC) 20 MG capsule   Oral   Take 20 mg by mouth daily as needed. For heartburn or acid reflux.          . phenytoin (DILANTIN) 50 MG tablet   Oral   Chew 4 tablets (200 mg total) by mouth at bedtime.   120 tablet   6   . polyethylene glycol (MIRALAX / GLYCOLAX) packet   Oral   Take 17 g by mouth daily as needed. For constipation. Mix into 8 ounces of fluid & drink   30 each   6   . QUEtiapine (SEROQUEL) 300 MG tablet   Oral   Take 900 mg by mouth at bedtime.          . senna-docusate (SENOKOT-S) 8.6-50 MG per tablet   Oral   Take 1 tablet by mouth 2 (two) times daily.   30 tablet   0   . simvastatin (ZOCOR) 40 MG tablet   Oral   Take 40 mg by mouth every evening.         . testosterone cypionate (DEPOTESTOTERONE CYPIONATE) 100 MG/ML injection   Intramuscular   Inject 2.5 mLs (250 mg total) into the muscle every 14 (fourteen) days.   10 mL   3    BP 110/78  Pulse 70  Temp(Src) 99.5 F (37.5 C) (Oral)  Resp 21  SpO2 98% Physical Exam  Constitutional: He is oriented to person, place, and time. He appears well-developed and well-nourished.  HENT:  Head: Normocephalic and atraumatic.  Eyes: EOM are normal. Pupils are equal, round, and reactive to light.  Neck: Neck supple.  Cardiovascular: Normal rate, regular rhythm and intact distal pulses.   Pulmonary/Chest: Effort normal and breath sounds normal. No respiratory distress.  Abdominal: Bowel sounds are normal.  Musculoskeletal:  L shoulder TTP with dec ROM 2/2 pain. Distal N/V intact, no erythema or inc warmth to touch  Neurological: He is alert and oriented to person, place, and time. No cranial nerve deficit. Coordination normal.  Skin: Skin is warm and dry.    ED Course  Procedures (including critical care time) Labs Review Labs Reviewed  CBC - Abnormal; Notable for the following:    RBC 3.60 (*)    Hemoglobin 12.4 (*)    HCT 33.6 (*)    MCH 34.4 (*)    MCHC 36.9 (*)    All other components within normal limits  COMPREHENSIVE METABOLIC PANEL - Abnormal; Notable for the following:    Glucose,  Bld 119 (*)    All other components within normal limits  URINALYSIS, ROUTINE W REFLEX MICROSCOPIC - Abnormal; Notable for the following:    APPearance CLOUDY (*)    Nitrite POSITIVE (*)    All other components within  normal limits  URINE MICROSCOPIC-ADD ON - Abnormal; Notable for the following:    Bacteria, UA MANY (*)    All other components within normal limits  POCT I-STAT, CHEM 8 - Abnormal; Notable for the following:    Glucose, Bld 122 (*)    Hemoglobin 12.6 (*)    HCT 37.0 (*)    All other components within normal limits  URINE CULTURE  CG4 I-STAT (LACTIC ACID)  POCT I-STAT TROPONIN I   Imaging Review Ct Head Wo Contrast  09/25/2013   *RADIOLOGY REPORT*  Clinical Data: Lethargy; hypotension.  CT HEAD WITHOUT CONTRAST  Technique:  Contiguous axial images were obtained from the base of the skull through the vertex without contrast.  Comparison: CT of the head performed 12/06/2012  Findings: There is no evidence of acute infarction, mass lesion, or intra- or extra-axial hemorrhage on CT.  Prominence of the ventricles and sulci reflects mild cortical volume loss.  Mild cerebellar atrophy is noted.  The brainstem and fourth ventricle are within normal limits.  The basal ganglia are unremarkable in appearance.  The cerebral hemispheres demonstrate grossly normal gray-white differentiation. No mass effect or midline shift is seen.  There is no evidence of fracture; visualized osseous structures are unremarkable in appearance.  The visualized portions of the orbits are within normal limits.  The paranasal sinuses and mastoid air cells are well-aerated.  No significant soft tissue abnormalities are seen.  IMPRESSION:  1.  No acute intracranial pathology seen on CT. 2.  Mild cortical volume loss noted.   Original Report Authenticated By: Tonia Ghent, M.D.   Dg Chest Portable 1 View  09/25/2013   *RADIOLOGY REPORT*  Clinical Data: Hypotension.  PORTABLE CHEST - 1 VIEW  Comparison: Chest  radiograph performed 09/20/2013  Findings: The lungs are mildly hypoexpanded.  Mild left basilar atelectasis is noted.  No definite pleural effusion or pneumothorax is seen.  The cardiomediastinal silhouette is normal in size.  No acute osseous abnormalities are identified.  A left shoulder arthroplasty is incompletely imaged but appears grossly unremarkable.  Screws are noted at the right humeral head.  IMPRESSION: Lungs mildly hypoexpanded; mild left basilar atelectasis noted.   Original Report Authenticated By: Tonia Ghent, M.D.     Date: 09/25/2013  Rate: 70  Rhythm: normal sinus rhythm  QRS Axis: normal  Intervals: normal  ST/T Wave abnormalities: nonspecific ST/T changes  Conduction Disutrbances:none  Narrative Interpretation:   Old EKG Reviewed: unchanged from previous EKG dated 08/11/2012  Evaluated by family practice resident team - patient baseline mentation. He has home health nurse visit scheduled for 11 AM and family practice team feels patient is okay for discharge home and outpatient followup. Confusion resolved. Normotensive after fluid bolus. Normal lactate. Patient is agreeable to plan. Antibiotics for UTI.   MDM  Diagnosis: inappropriate use of BP medications, transient hypotension, UTI Cx pending  L shoulder appears to be healing healing appropriately post op  ECG, labs and imaging as above  MED consult in ED - PT is known to [facility] service and he has outpatient home health today  VS and nurses notes reviewed and considered.      Sunnie Nielsen, MD 09/25/13 [phone]

## 2013-09-26 LAB — URINE CULTURE: Colony Count: >=100000

## 2013-09-27 ENCOUNTER — Telehealth (HOSPITAL_COMMUNITY): Payer: Self-pay | Admitting: Emergency Medicine

## 2013-09-27 NOTE — ED Notes (Signed)
Post ED Visit - Positive Culture Follow-up  Culture report reviewed by antimicrobial stewardship pharmacist: []  Wes Dulaney, Pharm.D., BCPS [x]  Celedonio Miyamoto, Pharm.D., BCPS []  Georgina Pillion, Pharm.D., BCPS []  [place], [address].D., BCPS, AAHIVP []  Estella Husk, Pharm.D., BCPS, AAHIVP  Positive urine culture Treated with Cefpodoxime, organism sensitive to the same and no further patient follow-up is required at this time.  Kylie A Holland 09/27/2013, 12:03 PM

## 2013-10-06 ENCOUNTER — Telehealth: Payer: Self-pay

## 2013-10-06 NOTE — Telephone Encounter (Signed)
Spoke with [name] with [facility].  I gave her our fax to [employer] refax forms she may be referring to.  Also, Since his issues have been since his shoulder surgery, I asked if he has a follow up appt with surgeon.  Pt sees his surgeon on Thursday morning.  They will call our office if need after seeing the surgeon.  Desi Rowe, Darlyne Russian, CMA

## 2013-10-06 NOTE — Telephone Encounter (Signed)
[name] from [facility] reports that patient BP is running very low (sitting 100/76) (standing 80/42). He has fallen twice within the past couple of days and having a very hard time after his shoulder surgery. Also, states that  there was some paperwork that was faxed for patient to receive help at home. Did not see any paperwork in MD box.

## 2013-10-07 NOTE — Telephone Encounter (Signed)
I have completed the forms and placed in "to be faxed" box.

## 2013-10-08 NOTE — Telephone Encounter (Signed)
Patient was recently seen at the ED,UA showed possible UTI for which culture was obtained, result positive for Klebsiella. I reviewed his record, ED attending prescribed Vantin to him for 7 days, I called and spoke with him,he got medication,but used it incorrectly instead of twice daily he used it once daily. I also called his pharmacy who confirmed he picked up his A/B. I suggested to patient to see his PCP this week for repeat urine check whether he is symptomatic or not. He verbalized understanding and agreed to call for an appointment with Dr Piedad Climes.

## 2013-10-10 ENCOUNTER — Telehealth: Payer: Self-pay | Admitting: Family Medicine

## 2013-10-10 NOTE — Telephone Encounter (Signed)
Patient called the Emergency line with complaints of difficulty moving around and doing daily activities at home following surgery. Informed him that I would inform his PCP so that she could discuss Home Health nursing/Aid/PT.

## 2013-10-12 ENCOUNTER — Telehealth: Payer: Self-pay | Admitting: Emergency Medicine

## 2013-10-12 DIAGNOSIS — S4292XD Fracture of left shoulder girdle, part unspecified, subsequent encounter for fracture with routine healing: Secondary | ICD-10-CM

## 2013-10-12 NOTE — Telephone Encounter (Signed)
Pt called because he was told by social worker at the hospital that they would get him a home health nurse. He needs help at the house and getting out to the store. He had surgery on his shoulder and it is still paralyzed and needs to be able to get to his PT appointments. [name]

## 2013-10-13 NOTE — Telephone Encounter (Signed)
I have placed an order for [facility] and aide.  Additionally, I filled out paperwork for [facility] last week for a home aide.

## 2013-10-31 ENCOUNTER — Emergency Department (HOSPITAL_COMMUNITY): Payer: Medicaid Other

## 2013-10-31 ENCOUNTER — Emergency Department (HOSPITAL_COMMUNITY)
Admission: EM | Admit: 2013-10-31 | Discharge: 2013-10-31 | Disposition: A | Discharge status: Home / Self Care | Payer: Medicaid Other | Attending: Emergency Medicine | Admitting: Emergency Medicine

## 2013-10-31 ENCOUNTER — Encounter (HOSPITAL_COMMUNITY): Payer: Self-pay | Admitting: Emergency Medicine

## 2013-10-31 DIAGNOSIS — G40909 Epilepsy, unspecified, not intractable, without status epilepticus: Secondary | ICD-10-CM | POA: Insufficient documentation

## 2013-10-31 DIAGNOSIS — N390 Urinary tract infection, site not specified: Secondary | ICD-10-CM

## 2013-10-31 DIAGNOSIS — I10 Essential (primary) hypertension: Secondary | ICD-10-CM | POA: Insufficient documentation

## 2013-10-31 DIAGNOSIS — R42 Dizziness and giddiness: Secondary | ICD-10-CM | POA: Insufficient documentation

## 2013-10-31 DIAGNOSIS — F411 Generalized anxiety disorder: Secondary | ICD-10-CM | POA: Insufficient documentation

## 2013-10-31 DIAGNOSIS — E785 Hyperlipidemia, unspecified: Secondary | ICD-10-CM | POA: Insufficient documentation

## 2013-10-31 DIAGNOSIS — Z79899 Other long term (current) drug therapy: Secondary | ICD-10-CM | POA: Insufficient documentation

## 2013-10-31 DIAGNOSIS — Z9889 Other specified postprocedural states: Secondary | ICD-10-CM | POA: Insufficient documentation

## 2013-10-31 DIAGNOSIS — F3289 Other specified depressive episodes: Secondary | ICD-10-CM | POA: Insufficient documentation

## 2013-10-31 DIAGNOSIS — K219 Gastro-esophageal reflux disease without esophagitis: Secondary | ICD-10-CM | POA: Insufficient documentation

## 2013-10-31 DIAGNOSIS — Z8614 Personal history of Methicillin resistant Staphylococcus aureus infection: Secondary | ICD-10-CM | POA: Insufficient documentation

## 2013-10-31 DIAGNOSIS — F329 Major depressive disorder, single episode, unspecified: Secondary | ICD-10-CM | POA: Insufficient documentation

## 2013-10-31 LAB — RAPID URINE DRUG SCREEN, HOSP PERFORMED
Amphetamines: NOT DETECTED
Barbiturates: NOT DETECTED
Benzodiazepines: POSITIVE — AB
Cocaine: NOT DETECTED
Opiates: NOT DETECTED
Tetrahydrocannabinol: NOT DETECTED

## 2013-10-31 LAB — COMPREHENSIVE METABOLIC PANEL
ALT: 31 U/L (ref 0–53)
AST: 36 U/L (ref 0–37)
Albumin: 4.2 g/dL (ref 3.5–5.2)
Alkaline Phosphatase: 138 U/L — ABNORMAL HIGH (ref 39–117)
BUN: 17 mg/dL (ref 6–23)
CO2: 24 mEq/L (ref 19–32)
Calcium: 9.6 mg/dL (ref 8.4–10.5)
Chloride: 101 mEq/L (ref 96–112)
Creatinine, Ser: 0.98 mg/dL (ref 0.50–1.35)
GFR calc Af Amer: >90 mL/min (ref >90)
GFR calc non Af Amer: 88 mL/min — ABNORMAL LOW (ref >90)
Glucose, Bld: 77 mg/dL (ref 70–99)
Potassium: 4.7 mEq/L (ref 3.5–5.1)
Sodium: 137 mEq/L (ref 135–145)
Total Bilirubin: 0.2 mg/dL — ABNORMAL LOW (ref 0.3–1.2)
Total Protein: 7.7 g/dL (ref 6.0–8.3)

## 2013-10-31 LAB — TYPE AND SCREEN
ABO/RH(D): A POS
Antibody Screen: NEGATIVE

## 2013-10-31 LAB — ABO/RH: ABO/RH(D): A POS

## 2013-10-31 LAB — ETHANOL: Alcohol, Ethyl (B): <11 mg/dL (ref 0–11)

## 2013-10-31 LAB — URINALYSIS, ROUTINE W REFLEX MICROSCOPIC
Bilirubin Urine: NEGATIVE
Glucose, UA: NEGATIVE mg/dL
Hgb urine dipstick: NEGATIVE
Ketones, ur: NEGATIVE mg/dL
Nitrite: NEGATIVE
Protein, ur: NEGATIVE mg/dL
Specific Gravity, Urine: 1.025 (ref 1.005–1.030)
Urobilinogen, UA: 0.2 mg/dL (ref 0.0–1.0)
pH: 6 (ref 5.0–8.0)

## 2013-10-31 LAB — CBC WITH DIFFERENTIAL/PLATELET
Basophils Absolute: 0 10*3/uL (ref 0.0–0.1)
Basophils Relative: 1 % (ref 0–1)
Eosinophils Absolute: 0.1 10*3/uL (ref 0.0–0.7)
Eosinophils Relative: 1 % (ref 0–5)
HCT: 40.9 % (ref 39.0–52.0)
Hemoglobin: 14.7 g/dL (ref 13.0–17.0)
Lymphocytes Relative: 26 % (ref 12–46)
Lymphs Abs: 1.5 10*3/uL (ref 0.7–4.0)
MCH: 33.9 pg (ref 26.0–34.0)
MCHC: 35.9 g/dL (ref 30.0–36.0)
MCV: 94.2 fL (ref 78.0–100.0)
Monocytes Absolute: 0.4 10*3/uL (ref 0.1–1.0)
Monocytes Relative: 6 % (ref 3–12)
Neutro Abs: 3.7 10*3/uL (ref 1.7–7.7)
Neutrophils Relative %: 66 % (ref 43–77)
Platelets: 296 10*3/uL (ref 150–400)
RBC: 4.34 MIL/uL (ref 4.22–5.81)
RDW: 13.4 % (ref 11.5–15.5)
WBC: 5.7 10*3/uL (ref 4.0–10.5)

## 2013-10-31 LAB — URINE MICROSCOPIC-ADD ON

## 2013-10-31 LAB — PROTIME-INR
INR: 0.98 (ref 0.00–1.49)
Prothrombin Time: 12.8 seconds (ref 11.6–15.2)

## 2013-10-31 LAB — APTT: aPTT: 24 seconds (ref 24–37)

## 2013-10-31 MED ORDER — SODIUM CHLORIDE 0.9 % IV SOLN
1000.0000 mL | INTRAVENOUS | Status: DC
  Administered 2013-10-31: 1000 mL via INTRAVENOUS

## 2013-10-31 MED ORDER — NITROFURANTOIN MONOHYD MACRO 100 MG PO CAPS: 100.0000 mg | ORAL_CAPSULE | Freq: Two times a day (BID) | ORAL | Status: DC

## 2013-10-31 MED ORDER — LEVOFLOXACIN 500 MG PO TABS
500.0000 mg | ORAL_TABLET | Freq: Once | ORAL | Status: AC
  Administered 2013-10-31: 500 mg via ORAL
  Filled 2013-10-31: qty 1

## 2013-10-31 NOTE — ED Notes (Signed)
Patient back from CT.  Patient in NAD.

## 2013-10-31 NOTE — ED Notes (Signed)
[name] (friend of the patient) [phone], called and made aware that patient has been DC  Per Arbin, he will be here shortly to take patient home Patient escorted to ED waiting room via wheelchair

## 2013-10-31 NOTE — ED Provider Notes (Signed)
CSN: [id number]     Arrival date & time 10/31/13  1427 History   First MD Initiated Contact with Patient 10/31/13 1510     Chief Complaint  Patient presents with  . Weakness  . Dizziness    The history is provided by the patient and the EMS personnel. The history is limited by the condition of the patient (pt with history schizoaffective disorder and delusions).   patient presents to the emergency room via EMS. According to EMS reports, the patient went to his dentist office today. He initially had scheduled an appointment but canceled it due to recent shoulder surgery. Patient apparently forgot instill went to the dentist office today. Patient walked into the office and told them he was feeling dizzy and sat on the ground. Patient then told the staff that he was having a seizure. He did not lose consciousness and they did not witness any seizure activity. EMS was contacted and brought the patient to the emergency room. Here in emergent patient denies having any trouble.  He denies that he did this at the nursing home. He says he is concerned about his recent shoulder surgery. He wonders if his other shoulder is okay. He denies trouble with chest pain, fevers or coughing. He denies any shortness of breath. Denies any vomiting or diarrhea. States he has been taking his medications regularly  Past Medical History  Diagnosis Date  . Seizures   . Hypertension   . GERD (gastroesophageal reflux disease)   . Dyslipidemia   . Schizoaffective disorder   . Depression   . Anxiety   . MRSA carrier    Past Surgical History  Procedure Laterality Date  . Self orchectomy    . Colonoscopy  2010  . Orif shoulder fracture    . Shoulder closed reduction Left 09/16/2013    Procedure: CLOSED REDUCTION SHOULDER;  Surgeon: Shelda Pal, MD;  Location: [facility];  Service: Orthopedics;  Laterality: Left;  . Shoulder hemi-arthroplasty Left 09/18/2013    Procedure: LEFT SHOULDER HEMI-ARTHROPLASTY;  Surgeon: Verlee Rossetti, MD;  Location: [facility];  Service: Orthopedics;  Laterality: Left;   Family History  Problem Relation Age of Onset  . Stroke Father     Living at 57  . Stroke Mother     Stroke in late 40's. Died in her 5's   History  Substance Use Topics  . Smoking status: Never Smoker   . Smokeless tobacco: Never Used  . Alcohol Use: No    Review of Systems  Neurological: Positive for weakness.  All other systems reviewed and are negative.    Allergies  Sulfa antibiotics  Home Medications   Current Outpatient Rx  Name  Route  Sig  Dispense  Refill  . ALPRAZolam (XANAX) 1 MG tablet   Oral   Take 1 tablet (1 mg total) by mouth 3 (three) times daily as needed for anxiety.   30 tablet   0   . amitriptyline (ELAVIL) 150 MG tablet   Oral   Take 450 mg by mouth at bedtime.         . clonazePAM (KLONOPIN) 2 MG tablet   Oral   Take 2.5 tablets (5 mg total) by mouth 2 (two) times daily.   150 tablet   3   . doxepin (SINEQUAN) 150 MG capsule   Oral   Take 900 mg by mouth at bedtime.         Marland Kitchen lisinopril (PRINIVIL,ZESTRIL) 5 MG tablet  Oral   Take 1 tablet (5 mg total) by mouth daily.   90 tablet   3   . metoprolol succinate (TOPROL-XL) 50 MG 24 hr tablet   Oral   Take 3 tablets (150 mg total) by mouth every evening. Take with or immediately following a meal.   90 tablet   3   . omeprazole (PRILOSEC) 20 MG capsule   Oral   Take 20 mg by mouth daily as needed. For heartburn or acid reflux.         . phenytoin (DILANTIN) 50 MG tablet   Oral   Chew 4 tablets (200 mg total) by mouth at bedtime.   120 tablet   6   . polyethylene glycol (MIRALAX / GLYCOLAX) packet   Oral   Take 17 g by mouth daily as needed. For constipation. Mix into 8 ounces of fluid & drink   30 each   6   . QUEtiapine (SEROQUEL) 300 MG tablet   Oral   Take 900 mg by mouth at bedtime.          . simvastatin (ZOCOR) 40 MG tablet   Oral   Take 40 mg by mouth every evening.          . testosterone cypionate (DEPOTESTOTERONE CYPIONATE) 200 MG/ML injection   Intramuscular   Inject 400 mg into the muscle every 14 (fourteen) days.         . nitrofurantoin, macrocrystal-monohydrate, (MACROBID) 100 MG capsule   Oral   Take 1 capsule (100 mg total) by mouth 2 (two) times daily.   20 capsule   0     Dispense as written.    BP 137/80  Pulse 65  Temp(Src) 98.3 F (36.8 C) (Oral)  Resp 20  SpO2 97% Physical Exam  Nursing note and vitals reviewed. Constitutional: He appears well-developed and well-nourished. No distress.  Disheveled  HENT:  Head: Normocephalic and atraumatic.  Right Ear: External ear normal.  Left Ear: External ear normal.  Mouth/Throat: No oropharyngeal exudate ( dry mucous membranes).  Eyes: Conjunctivae are normal. Right eye exhibits no discharge. Left eye exhibits no discharge. No scleral icterus.  Neck: Neck supple. No tracheal deviation present.  Cardiovascular: Normal rate, regular rhythm and intact distal pulses.   Pulmonary/Chest: Effort normal and breath sounds normal. No stridor. No respiratory distress. He has no wheezes. He has no rales.  Abdominal: Soft. Bowel sounds are normal. He exhibits no distension. There is no tenderness. There is no rebound and no guarding.  Musculoskeletal: He exhibits no edema and no tenderness.  Equal strength bilateral upper and  lower extremities, no facial droop, extraocular movements are intact, sensation intact in all extremities, alert and oriented to person place and time  Neurological: He is alert. He has normal strength. No sensory deficit. Cranial nerve deficit:  no gross defecits noted. He exhibits normal muscle tone. He displays no seizure activity. Coordination normal.  Skin: Skin is warm and dry. No rash noted.  Psychiatric: His speech is tangential. His speech is not rapid and/or pressured. He is slowed. He is not agitated, not aggressive, not hyperactive and not actively hallucinating.  Flat  affect He is attentive.    ED Course  Procedures (including critical care time) Labs Review Labs Reviewed  COMPREHENSIVE METABOLIC PANEL - Abnormal; Notable for the following:    Alkaline Phosphatase 138 (*)    Total Bilirubin 0.2 (*)    GFR calc non Af Amer 88 (*)    All  other components within normal limits  URINALYSIS, ROUTINE W REFLEX MICROSCOPIC - Abnormal; Notable for the following:    APPearance CLOUDY (*)    Leukocytes, UA MODERATE (*)    All other components within normal limits  URINE RAPID DRUG SCREEN (HOSP PERFORMED) - Abnormal; Notable for the following:    Benzodiazepines POSITIVE (*)    All other components within normal limits  APTT  CBC WITH DIFFERENTIAL  PROTIME-INR  ETHANOL  URINE MICROSCOPIC-ADD ON  TYPE AND SCREEN  ABO/RH   Imaging Review Dg Chest 2 View  10/31/2013   CLINICAL DATA:  Weakness, dizziness  EXAM: CHEST  2 VIEW  COMPARISON:  None.  FINDINGS: The heart size and mediastinal contours are within normal limits. There are low lung volumes. There is bibasilar atelectasis. There is no focal consolidation, pleural effusion or pneumothorax. There is a left shoulder arthroplasty. There is evidence of prior right proximal humeral surgery with 3 screws present.  IMPRESSION: No active cardiopulmonary disease.   Electronically Signed   By: Elige Ko   On: 10/31/2013 16:32   Ct Head Wo Contrast  10/31/2013   CLINICAL DATA:  Confusion, altered mental status.  EXAM: CT HEAD WITHOUT CONTRAST  TECHNIQUE: Contiguous axial images were obtained from the base of the skull through the vertex without intravenous contrast.  COMPARISON:  09/25/2013  FINDINGS: There is no evidence of mass effect, midline shift or extra-axial fluid collections. There is no evidence of a space-occupying lesion or intracranial hemorrhage. There is no evidence of a cortical-based area of acute infarction. There is mild generalized atrophy.  The ventricles and sulci are appropriate for the  patient's age. The basal cisterns are patent.  Visualized portions of the orbits are unremarkable. The visualized portions of the paranasal sinuses and mastoid air cells are unremarkable.  The osseous structures are unremarkable.  IMPRESSION: No acute intracranial pathology.   Electronically Signed   By: Elige Ko   On: 10/31/2013 16:14    EKG Interpretation   None       MDM   1. UTI (urinary tract infection)    Patient is alert and oriented. His vital signs are reassuring. Patient's laboratory tests are consistent with a urinary tract infection. There is no other evidence of acute emergency medical condition. I reviewed his previous urine culture. The pathogen was sensitive to Macrobid. This will not interact with his Seroquel.    Patient's behavior is consistent with his schizoaffective disorder. He is not acutely psychotic. I do not feel that is a danger to himself. Patient is stable for discharge and outpatient followup    Celene Kras, MD 10/31/13 [phone]

## 2013-10-31 NOTE — ED Notes (Signed)
Per EMS: Pt had a dentist appt cancelled due to shoulder surgery.  Forgot to take it off his calendar, showed up at the dentist office.  Walked inside.  Told them he was dizzy and then fell on the ground.  Pt has psych hx.  Unsure what his dx.  Upon pt arrival to ED, pt gently placed himself in the floor and stated that he was having a seizure.  No LOC, no seizure like activity noted.

## 2013-10-31 NOTE — ED Notes (Signed)
Dr.Knapp at bedside  

## 2013-10-31 NOTE — ED Notes (Signed)
Patient disoriented to time, place and situation Patient attempting to leave room to "go upstairs and find my computer." Patient reoriented and assisted back to stretcher Will inform MD

## 2013-10-31 NOTE — ED Notes (Signed)
PIV placed and labs drawn, sent While placing PIV, patient talking non-stop with flight of ideas Patient states "My right arm is broken"--but has full ROM Patient with equal and strong grips bilaterally Patient is disoriented to place and month, but is aware of year Patient with slightly slurred speech PERRLA IVF started, see [facility] Patient informed of need for urine specimen--agrees and v/u, but states he does not have the urge to void at this time Side rails up, call bell in reach

## 2013-10-31 NOTE — ED Notes (Signed)
He tells me he has had recent "plate and screws put in my (left) shoulder".  When asked why he is here he tells me "It's because of that '[place] medicine'".  He is slurring his words as one who has taken narcotics.  He states that he has been to [place] to see the swamies about 10 years ago.  He is currently engaging in flight-of-ideas about being "delusional" and having different doctors prescribe him meds--frequently performing meditation, etc.  PEARRL at 3mm; and he follows commands with all extremities with ease.  He is able to tell me it is Saturday, and that his orthopedic surgeon is Dr. Ranell Patrick.

## 2013-11-18 ENCOUNTER — Other Ambulatory Visit: Payer: Self-pay | Admitting: Emergency Medicine

## 2013-11-23 ENCOUNTER — Ambulatory Visit: Payer: Medicaid Other | Admitting: Emergency Medicine

## 2013-11-30 ENCOUNTER — Encounter: Payer: Self-pay | Admitting: *Deleted

## 2013-12-01 ENCOUNTER — Ambulatory Visit: Payer: Medicaid Other | Attending: Emergency Medicine | Admitting: Physical Therapy

## 2013-12-01 DIAGNOSIS — IMO0001 Reserved for inherently not codable concepts without codable children: Secondary | ICD-10-CM | POA: Insufficient documentation

## 2013-12-01 DIAGNOSIS — M6281 Muscle weakness (generalized): Secondary | ICD-10-CM | POA: Insufficient documentation

## 2013-12-01 DIAGNOSIS — M25619 Stiffness of unspecified shoulder, not elsewhere classified: Secondary | ICD-10-CM | POA: Insufficient documentation

## 2013-12-01 NOTE — Telephone Encounter (Signed)
error 

## 2013-12-04 ENCOUNTER — Telehealth: Payer: Self-pay | Admitting: Emergency Medicine

## 2013-12-04 NOTE — Telephone Encounter (Signed)
Called pt. Pt complains of unable to lift his hand x 3 months. He had a total shoulder replacement 3 months ago. I advised him to f/up with his surgeon. Pt reports, that he has done that, but all the exercises are not helping him. He has several complaints, like not having had a physical in a long time. I told him to schedule an appt with Dr.Honig for that. Also, he reports that he has DM in his family. Again, I advised him to schedule an appt. Still, he wanted to speak with Dr.Honig. I explained again, that  Dr.Honig is not in clinic and he has not been seen since 7/14. He needs to discuss his issues at a OV. He still had several questions, like needing a flu shot. Again, I advised him to schedule OV. Dr.Honig will not call him, because all these issues need to be addressed in the office. He finally agreed and will schedule OV with Dr.Honig. Lorenda Hatchet, [name]

## 2013-12-04 NOTE — Telephone Encounter (Signed)
Pt called and would like to speak to Dr. Piedad Climes and discuss his issues. jw

## 2013-12-05 ENCOUNTER — Other Ambulatory Visit: Payer: Self-pay | Admitting: Emergency Medicine

## 2013-12-05 MED ORDER — CLONAZEPAM 2 MG PO TABS: 5.0000 mg | ORAL_TABLET | Freq: Two times a day (BID) | ORAL | Status: DC

## 2013-12-15 ENCOUNTER — Encounter: Payer: Medicaid Other | Admitting: Emergency Medicine

## 2013-12-19 ENCOUNTER — Other Ambulatory Visit: Payer: Self-pay | Admitting: Family Medicine

## 2013-12-21 ENCOUNTER — Encounter: Payer: Self-pay | Admitting: Emergency Medicine

## 2013-12-21 ENCOUNTER — Ambulatory Visit (INDEPENDENT_AMBULATORY_CARE_PROVIDER_SITE_OTHER): Payer: Medicaid Other | Admitting: Emergency Medicine

## 2013-12-21 VITALS — BP 126/81 | HR 87 | Temp 98.6°F | Ht 71.0 in | Wt 196.0 lb

## 2013-12-21 DIAGNOSIS — R569 Unspecified convulsions: Secondary | ICD-10-CM

## 2013-12-21 DIAGNOSIS — Z8669 Personal history of other diseases of the nervous system and sense organs: Secondary | ICD-10-CM

## 2013-12-21 DIAGNOSIS — E291 Testicular hypofunction: Secondary | ICD-10-CM

## 2013-12-21 DIAGNOSIS — R300 Dysuria: Secondary | ICD-10-CM

## 2013-12-21 DIAGNOSIS — Z0000 Encounter for general adult medical examination without abnormal findings: Secondary | ICD-10-CM

## 2013-12-21 DIAGNOSIS — E785 Hyperlipidemia, unspecified: Secondary | ICD-10-CM

## 2013-12-21 DIAGNOSIS — M7502 Adhesive capsulitis of left shoulder: Secondary | ICD-10-CM

## 2013-12-21 DIAGNOSIS — M7500 Adhesive capsulitis of unspecified shoulder: Secondary | ICD-10-CM

## 2013-12-21 DIAGNOSIS — I10 Essential (primary) hypertension: Secondary | ICD-10-CM

## 2013-12-21 DIAGNOSIS — R350 Frequency of micturition: Secondary | ICD-10-CM | POA: Insufficient documentation

## 2013-12-21 DIAGNOSIS — Z23 Encounter for immunization: Secondary | ICD-10-CM

## 2013-12-21 LAB — LIPID PANEL
Cholesterol: 149 mg/dL (ref 0–200)
HDL: 53 mg/dL (ref >39)
LDL Cholesterol: 64 mg/dL (ref 0–99)
Total CHOL/HDL Ratio: 2.8 Ratio
Triglycerides: 161 mg/dL — ABNORMAL HIGH (ref <150)
VLDL: 32 mg/dL (ref 0–40)

## 2013-12-21 LAB — POCT GLYCOSYLATED HEMOGLOBIN (HGB A1C): Hemoglobin A1C: 5.5

## 2013-12-21 MED ORDER — METOPROLOL SUCCINATE ER 50 MG PO TB24: 150.0000 mg | ORAL_TABLET | Freq: Every day | ORAL | Status: DC

## 2013-12-21 MED ORDER — LISINOPRIL 5 MG PO TABS: 5.0000 mg | ORAL_TABLET | Freq: Every day | ORAL | Status: DC

## 2013-12-21 MED ORDER — SIMVASTATIN 40 MG PO TABS: ORAL_TABLET | ORAL | Status: DC

## 2013-12-21 NOTE — Progress Notes (Signed)
   Subjective:    Patient ID: [name], male    DOB: Feb 17, 1956, 58 y.o.   MRN: [id number]  HPI [name] is here for annual exam.  I have reviewed and updated the following as appropriate: allergies, current medications, past family history, past medical history, past social history, past surgical history and problem list PMHx: schizoaffective disorder (followed at [facility]); HTN, chronic constipation, androgen deficiency  SHx: non smoker  Concerned about a UTI today.  He was diagnosed and treated for one about 2 months ago.  He completed a course of macrobid, but was taking it once a day instead of twice a day.  He denies dysuria, but does complain of frequency.  No hematuria.  Also with concerns about his shoulder.  About 2 months ago he had a possible seizure and woke up with a dislocated/fractured left shoulder.  He underwent a total shoulder replacement.  Now with limited mobility of the shoulder.  He is in physical therapy and doing home exercises.  Health Maintenance: will do prevnar today with pneumovax in 1 year   Review of Systems Patient Information Form: Screening and ROS  Do you feel safe in relationships? yes PHQ-2:positive - followed at [facility]  Review of Symptoms  General:  Negative for nexplained weight loss, fever Skin: Negative for new or changing mole, sore that won't heal HEENT: Negative for trouble hearing, trouble seeing, ringing in ears, mouth sores, hoarseness, change in voice, dysphagia. CV:  Negative for chest pain, dyspnea, edema, palpitations Resp: Negative for cough, dyspnea, hemoptysis GI: Negative for nausea, vomiting, diarrhea, +constipation, abdominal pain, melena, hematochezia. GU: Negative for dysuria, incontinence, urinary hesitance, +urinary frequency, hematuria, vaginal or penile discharge, polyuria, sexual difficulty, lumps in testicle or breasts MSK: Negative for muscle cramps or aches, joint pain or swelling Neuro: Negative for  headaches, weakness, numbness, dizziness, passing out/fainting Psych: Negative for +depression, +anxiety, memory problems      Objective:   Physical Exam BP 126/81  Pulse 87  Temp(Src) 98.6 F (37 C) (Oral)  Ht 5\' 11"  (1.803 m)  Wt 196 lb (88.905 kg)  BMI 27.35 kg/m2 Gen: alert, cooperative, NAD HEENT: AT/[place], sclera white, MMM Neck: supple, no LAD, no bruits CV: RRR, no murmurs Pulm: CTAB, no wheezes or rales Ext: no edema, 2+ DP and radial pulses bilaterally Neuro: no focal deficits     Assessment & Plan:

## 2013-12-21 NOTE — Assessment & Plan Note (Signed)
On testosterone replacement. PSA today.

## 2013-12-21 NOTE — Assessment & Plan Note (Signed)
Had UTI several months ago and while he completed the macrobid, he only took it once a day. Will repeat UA and culture - pt to return to collect.  Will only give antibiotics if concerning for infection.

## 2013-12-21 NOTE — Assessment & Plan Note (Signed)
Will check dilantin level today. Maybe had a seizure 2 months ago. Will monitor and adjust medications if additional seizures occur.

## 2013-12-21 NOTE — Patient Instructions (Signed)
It was nice to see you!  We are checking some blood work today.  I will call you if anything is wrong, otherwise you will get a letter with the results in the mail in 1-2 weeks.  You can make a lab appointment tomorrow or later this week to get the urine specimen.    Follow up in 6 months or sooner as needed.

## 2013-12-21 NOTE — Assessment & Plan Note (Signed)
Lipid panel today

## 2013-12-21 NOTE — Assessment & Plan Note (Signed)
Well controlled. Continue toprol xl 50mg  daily and lisinopril 5mg  daily.

## 2013-12-21 NOTE — Assessment & Plan Note (Addendum)
Continue PT and range of motion exercises at home. Discussed that this will take years to improve and he will likely never regain complete range of motion.

## 2013-12-22 LAB — PHENYTOIN LEVEL, TOTAL: Phenytoin Lvl: 18.6 ug/mL (ref 10.0–20.0)

## 2013-12-22 LAB — PSA: PSA: 0.8 ng/mL (ref <=4.00)

## 2013-12-28 ENCOUNTER — Encounter: Payer: Self-pay | Admitting: Emergency Medicine

## 2013-12-28 ENCOUNTER — Telehealth: Payer: Self-pay | Admitting: *Deleted

## 2013-12-28 NOTE — Telephone Encounter (Signed)
Prior authorization form (along with Medicaid preferred drug list) for Metoprolol placed in MD box for completion.

## 2013-12-29 ENCOUNTER — Other Ambulatory Visit: Payer: Self-pay | Admitting: Emergency Medicine

## 2013-12-29 NOTE — Telephone Encounter (Signed)
Toprol XL brand name is on preferred list.  Discussed with pharmacist and no PA is needed.

## 2013-12-31 ENCOUNTER — Ambulatory Visit: Payer: Medicaid Other | Attending: Emergency Medicine | Admitting: Rehabilitation

## 2013-12-31 DIAGNOSIS — M6281 Muscle weakness (generalized): Secondary | ICD-10-CM | POA: Insufficient documentation

## 2013-12-31 DIAGNOSIS — IMO0001 Reserved for inherently not codable concepts without codable children: Secondary | ICD-10-CM | POA: Insufficient documentation

## 2013-12-31 DIAGNOSIS — M25619 Stiffness of unspecified shoulder, not elsewhere classified: Secondary | ICD-10-CM | POA: Insufficient documentation

## 2014-01-06 ENCOUNTER — Other Ambulatory Visit: Payer: Self-pay | Admitting: Emergency Medicine

## 2014-01-06 ENCOUNTER — Encounter: Payer: Medicaid Other | Admitting: Physical Therapy

## 2014-01-07 ENCOUNTER — Ambulatory Visit: Payer: Medicaid Other | Admitting: Physical Therapy

## 2014-01-07 ENCOUNTER — Other Ambulatory Visit: Payer: Self-pay | Admitting: Emergency Medicine

## 2014-01-07 MED ORDER — PHENYTOIN 50 MG PO CHEW: 200.0000 mg | CHEWABLE_TABLET | Freq: Every day | ORAL | Status: DC

## 2014-01-07 MED ORDER — CLONAZEPAM 2 MG PO TABS
5.0000 mg | ORAL_TABLET | Freq: Two times a day (BID) | ORAL | Status: DC
Start: 2014-01-07 — End: 2014-01-27

## 2014-01-12 ENCOUNTER — Encounter: Payer: Medicaid Other | Admitting: Rehabilitation

## 2014-01-19 ENCOUNTER — Ambulatory Visit: Payer: Medicaid Other | Attending: Emergency Medicine | Admitting: Physical Therapy

## 2014-01-19 DIAGNOSIS — M6281 Muscle weakness (generalized): Secondary | ICD-10-CM | POA: Insufficient documentation

## 2014-01-19 DIAGNOSIS — IMO0001 Reserved for inherently not codable concepts without codable children: Secondary | ICD-10-CM | POA: Insufficient documentation

## 2014-01-19 DIAGNOSIS — M25619 Stiffness of unspecified shoulder, not elsewhere classified: Secondary | ICD-10-CM | POA: Insufficient documentation

## 2014-01-20 ENCOUNTER — Emergency Department (HOSPITAL_COMMUNITY)
Admission: EM | Admit: 2014-01-20 | Discharge: 2014-01-21 | Disposition: A | Discharge status: Home / Self Care | Payer: Medicaid Other | Attending: Emergency Medicine | Admitting: Emergency Medicine

## 2014-01-20 ENCOUNTER — Encounter (HOSPITAL_COMMUNITY): Payer: Self-pay | Admitting: Emergency Medicine

## 2014-01-20 DIAGNOSIS — G40909 Epilepsy, unspecified, not intractable, without status epilepticus: Secondary | ICD-10-CM | POA: Insufficient documentation

## 2014-01-20 DIAGNOSIS — I10 Essential (primary) hypertension: Secondary | ICD-10-CM | POA: Insufficient documentation

## 2014-01-20 DIAGNOSIS — F32A Depression, unspecified: Secondary | ICD-10-CM

## 2014-01-20 DIAGNOSIS — Z8614 Personal history of Methicillin resistant Staphylococcus aureus infection: Secondary | ICD-10-CM | POA: Insufficient documentation

## 2014-01-20 DIAGNOSIS — F1310 Sedative, hypnotic or anxiolytic abuse, uncomplicated: Secondary | ICD-10-CM | POA: Insufficient documentation

## 2014-01-20 DIAGNOSIS — F329 Major depressive disorder, single episode, unspecified: Secondary | ICD-10-CM

## 2014-01-20 DIAGNOSIS — F259 Schizoaffective disorder, unspecified: Secondary | ICD-10-CM | POA: Insufficient documentation

## 2014-01-20 DIAGNOSIS — Z79899 Other long term (current) drug therapy: Secondary | ICD-10-CM | POA: Insufficient documentation

## 2014-01-20 DIAGNOSIS — R45851 Suicidal ideations: Secondary | ICD-10-CM | POA: Insufficient documentation

## 2014-01-20 DIAGNOSIS — F411 Generalized anxiety disorder: Secondary | ICD-10-CM | POA: Insufficient documentation

## 2014-01-20 DIAGNOSIS — E785 Hyperlipidemia, unspecified: Secondary | ICD-10-CM | POA: Insufficient documentation

## 2014-01-20 DIAGNOSIS — K219 Gastro-esophageal reflux disease without esophagitis: Secondary | ICD-10-CM | POA: Insufficient documentation

## 2014-01-20 LAB — COMPREHENSIVE METABOLIC PANEL
ALT: 28 U/L (ref 0–53)
AST: 26 U/L (ref 0–37)
Albumin: 4.6 g/dL (ref 3.5–5.2)
Alkaline Phosphatase: 121 U/L — ABNORMAL HIGH (ref 39–117)
BUN: 16 mg/dL (ref 6–23)
CO2: 30 mEq/L (ref 19–32)
Calcium: 9.7 mg/dL (ref 8.4–10.5)
Chloride: 94 mEq/L — ABNORMAL LOW (ref 96–112)
Creatinine, Ser: 1.16 mg/dL (ref 0.50–1.35)
GFR calc Af Amer: 78 mL/min — ABNORMAL LOW (ref >90)
GFR calc non Af Amer: 67 mL/min — ABNORMAL LOW (ref >90)
Glucose, Bld: 87 mg/dL (ref 70–99)
Potassium: 4.2 mEq/L (ref 3.7–5.3)
Sodium: 137 mEq/L (ref 137–147)
Total Bilirubin: <0.2 mg/dL — ABNORMAL LOW (ref 0.3–1.2)
Total Protein: 8.1 g/dL (ref 6.0–8.3)

## 2014-01-20 LAB — CBC
HCT: 43.5 % (ref 39.0–52.0)
Hemoglobin: 15.5 g/dL (ref 13.0–17.0)
MCH: 32.6 pg (ref 26.0–34.0)
MCHC: 35.6 g/dL (ref 30.0–36.0)
MCV: 91.6 fL (ref 78.0–100.0)
Platelets: 209 10*3/uL (ref 150–400)
RBC: 4.75 MIL/uL (ref 4.22–5.81)
RDW: 13.7 % (ref 11.5–15.5)
WBC: 9.3 10*3/uL (ref 4.0–10.5)

## 2014-01-20 LAB — RAPID URINE DRUG SCREEN, HOSP PERFORMED
Amphetamines: NOT DETECTED
Barbiturates: NOT DETECTED
Benzodiazepines: POSITIVE — AB
Cocaine: NOT DETECTED
Opiates: NOT DETECTED
Tetrahydrocannabinol: NOT DETECTED

## 2014-01-20 LAB — ACETAMINOPHEN LEVEL: Acetaminophen (Tylenol), Serum: <15 ug/mL (ref 10–30)

## 2014-01-20 LAB — SALICYLATE LEVEL: Salicylate Lvl: <2 mg/dL — ABNORMAL LOW (ref 2.8–20.0)

## 2014-01-20 LAB — ETHANOL: Alcohol, Ethyl (B): <11 mg/dL (ref 0–11)

## 2014-01-20 LAB — PHENYTOIN LEVEL, TOTAL: Phenytoin Lvl: 19.4 ug/mL (ref 10.0–20.0)

## 2014-01-20 MED ORDER — METOPROLOL SUCCINATE ER 50 MG PO TB24
150.0000 mg | ORAL_TABLET | Freq: Every day | ORAL | Status: DC
  Administered 2014-01-20: 150 mg via ORAL
  Filled 2014-01-20 (×2): qty 1

## 2014-01-20 MED ORDER — QUETIAPINE FUMARATE 300 MG PO TABS
900.0000 mg | ORAL_TABLET | Freq: Every day | ORAL | Status: DC
Start: 2014-01-20 — End: 2014-01-21
  Administered 2014-01-20: 900 mg via ORAL
  Filled 2014-01-20: qty 3

## 2014-01-20 MED ORDER — SIMVASTATIN 40 MG PO TABS
40.0000 mg | ORAL_TABLET | Freq: Every day | ORAL | Status: DC
  Administered 2014-01-20: 40 mg via ORAL
  Filled 2014-01-20 (×2): qty 1

## 2014-01-20 MED ORDER — TESTOSTERONE CYPIONATE 200 MG/ML IM SOLN: 400.0000 mg | INTRAMUSCULAR | Status: DC

## 2014-01-20 MED ORDER — AMITRIPTYLINE HCL 50 MG PO TABS
450.0000 mg | ORAL_TABLET | Freq: Every day | ORAL | Status: DC
  Administered 2014-01-20: 450 mg via ORAL
  Filled 2014-01-20 (×2): qty 1

## 2014-01-20 MED ORDER — ONDANSETRON HCL 4 MG PO TABS: 4.0000 mg | ORAL_TABLET | Freq: Three times a day (TID) | ORAL | Status: DC | PRN

## 2014-01-20 MED ORDER — ACETAMINOPHEN 325 MG PO TABS: 650.0000 mg | ORAL_TABLET | ORAL | Status: DC | PRN

## 2014-01-20 MED ORDER — LISINOPRIL 5 MG PO TABS
5.0000 mg | ORAL_TABLET | Freq: Every day | ORAL | Status: DC
  Administered 2014-01-20: 5 mg via ORAL
  Filled 2014-01-20 (×2): qty 1

## 2014-01-20 MED ORDER — CLONAZEPAM 1 MG PO TABS
5.0000 mg | ORAL_TABLET | Freq: Two times a day (BID) | ORAL | Status: DC
  Administered 2014-01-20: 5 mg via ORAL
  Filled 2014-01-20: qty 10

## 2014-01-20 MED ORDER — AMITRIPTYLINE HCL 100 MG PO TABS: 450.0000 mg | ORAL_TABLET | Freq: Every day | ORAL | Status: DC

## 2014-01-20 MED ORDER — PANTOPRAZOLE SODIUM 40 MG PO TBEC
40.0000 mg | DELAYED_RELEASE_TABLET | Freq: Every day | ORAL | Status: DC
Start: 2014-01-20 — End: 2014-01-21
  Administered 2014-01-20 – 2014-01-21 (×2): 40 mg via ORAL
  Filled 2014-01-20 (×2): qty 1

## 2014-01-20 MED ORDER — TESTOSTERONE CYPIONATE 200 MG/ML IM SOLN
400.0000 mg | INTRAMUSCULAR | Status: DC
  Administered 2014-01-20: 400 mg via INTRAMUSCULAR
  Filled 2014-01-20: qty 2

## 2014-01-20 MED ORDER — PHENYTOIN 50 MG PO CHEW
200.0000 mg | CHEWABLE_TABLET | Freq: Every day | ORAL | Status: DC
  Administered 2014-01-20: 200 mg via ORAL
  Filled 2014-01-20 (×2): qty 4

## 2014-01-20 NOTE — ED Notes (Signed)
Pt states he is feeling like killing himself because he is afraid he is going to be homeless  Pt states he is not street smart  Pt states the political atmosphere is getting where he is not going to be able to get his medications and he states he is on several  Pt staes he is not at risk of loosing his house at this time but is afraid over time he is going to  Pt states he lost his parents a year ago and has been through a lot over his life  Pt states his brother has the trust to the house and he has not talked to him in over 2 years  Pt states the lawyer handling the estates is crooked  Pt states all he does is lay in his bed all day  Pt states he is very depressed  Pt states he has studied ways to kill himself and he plans to do it with helium and has bought 3 canisters of it and has bought tubing and a mask with masking tape to ensure there is no leaks  Pt is very detailed when discussing the details of this

## 2014-01-20 NOTE — ED Provider Notes (Signed)
CSN: [id number]     Arrival date & time 01/20/14  1936 History   First MD Initiated Contact with Patient 01/20/14 2010     Chief Complaint  Patient presents with  . Medical Clearance   (Consider location/radiation/quality/duration/timing/severity/associated sxs/prior Treatment) HPI [name] is a 58 y.o. male who presents emergency department complaining of worsening depression and suicidal thoughts. Patient states he has long-standing depression and prior suicidal ideations. He states that he has gone to his psychiatrist today who recommended him coming to emergency department. Patient states he does have a plan, states he bought some helium tanks, ducts, and duct tape to make sure there are no leaks. He states he takes all his medications as prescribed. He states he is worried about losing his house which belonged to his brother. He states he is not going anywhere, states laid in bed all day, no interest in doing anything. Patient is a poor historian gaze off tangent. Patient started talking about Delton See and how he would does not want to kill himself and not the same way. Patient also told me about his psychiatric medications and the fact that he is unable to take SSRIs but tricyclic antidepressants work good for him.  Past Medical History  Diagnosis Date  . Seizures   . Hypertension   . GERD (gastroesophageal reflux disease)   . Dyslipidemia   . Schizoaffective disorder   . Depression   . Anxiety   . MRSA carrier    Past Surgical History  Procedure Laterality Date  . Self orchectomy    . Colonoscopy  2010  . Orif shoulder fracture    . Shoulder closed reduction Left 09/16/2013    Procedure: CLOSED REDUCTION SHOULDER;  Surgeon: Mauri Pole, MD;  Location: [facility];  Service: Orthopedics;  Laterality: Left;  . Shoulder hemi-arthroplasty Left 09/18/2013    Procedure: LEFT SHOULDER HEMI-ARTHROPLASTY;  Surgeon: Augustin Schooling, MD;  Location: [facility];  Service: Orthopedics;   Laterality: Left;   Family History  Problem Relation Age of Onset  . Stroke Father     Living at 52  . Stroke Mother     Stroke in late 42's. Died in her 58's   History  Substance Use Topics  . Smoking status: Never Smoker   . Smokeless tobacco: Never Used  . Alcohol Use: No    Review of Systems  Constitutional: Negative for fever and chills.  Respiratory: Negative for cough, chest tightness and shortness of breath.   Cardiovascular: Negative for chest pain, palpitations and leg swelling.  Gastrointestinal: Negative for nausea, vomiting, abdominal pain, diarrhea and abdominal distention.  Genitourinary: Negative for dysuria, urgency, frequency and hematuria.  Musculoskeletal: Negative for arthralgias, myalgias, neck pain and neck stiffness.  Skin: Negative for rash.  Allergic/Immunologic: Negative for immunocompromised state.  Neurological: Negative for dizziness, weakness, light-headedness, numbness and headaches.  Psychiatric/Behavioral: Positive for suicidal ideas. The patient is nervous/anxious.        Positive for depression    Allergies  Sulfa antibiotics  Home Medications   Current Outpatient Rx  Name  Route  Sig  Dispense  Refill  . ALPRAZolam (XANAX) 1 MG tablet   Oral   Take 1 tablet (1 mg total) by mouth 3 (three) times daily as needed for anxiety.   30 tablet   0   . amitriptyline (ELAVIL) 150 MG tablet   Oral   Take 450 mg by mouth at bedtime.         . clonazePAM (  KLONOPIN) 2 MG tablet   Oral   Take 2.5 tablets (5 mg total) by mouth 2 (two) times daily.   150 tablet   3   . doxepin (SINEQUAN) 150 MG capsule   Oral   Take 900 mg by mouth at bedtime.         Marland Kitchen lisinopril (PRINIVIL,ZESTRIL) 5 MG tablet   Oral   Take 1 tablet (5 mg total) by mouth daily.   90 tablet   3   . metoprolol succinate (TOPROL XL) 50 MG 24 hr tablet   Oral   Take 3 tablets (150 mg total) by mouth daily. Take with or immediately following a meal.   270 tablet    3   . omeprazole (PRILOSEC) 20 MG capsule   Oral   Take 20 mg by mouth daily as needed. For heartburn or acid reflux.         . phenytoin (DILANTIN) 50 MG tablet   Oral   Chew 4 tablets (200 mg total) by mouth at bedtime.   120 tablet   11   . polyethylene glycol (MIRALAX / GLYCOLAX) packet   Oral   Take 17 g by mouth daily as needed. For constipation. Mix into 8 ounces of fluid & drink   30 each   6   . QUEtiapine (SEROQUEL) 300 MG tablet   Oral   Take 900 mg by mouth at bedtime.          . simvastatin (ZOCOR) 40 MG tablet      TAKE 1 TABLET (40 MG TOTAL) BY MOUTH AT BEDTIME.   90 tablet   3   . testosterone cypionate (DEPOTESTOTERONE CYPIONATE) 200 MG/ML injection   Intramuscular   Inject 400 mg into the muscle every 14 (fourteen) days.          BP 123/91  Pulse 97  Temp(Src) 99.8 F (37.7 C) (Oral)  Resp 20  SpO2 96% Physical Exam  Nursing note and vitals reviewed. Constitutional: He appears well-developed and well-nourished. No distress.  deshelved  HENT:  Head: Normocephalic and atraumatic.  Eyes: Conjunctivae are normal.  Neck: Neck supple.  Cardiovascular: Normal rate, regular rhythm and normal heart sounds.   Pulmonary/Chest: Effort normal. No respiratory distress. He has no wheezes. He has no rales.  Abdominal: Soft. Bowel sounds are normal. He exhibits no distension. There is no tenderness. There is no rebound.  Musculoskeletal: He exhibits no edema.  Neurological: He is alert.  Skin: Skin is warm and dry.  Psychiatric: His mood appears anxious. His speech is tangential. He exhibits a depressed mood. He expresses suicidal ideation. He expresses suicidal plans.  Flat affect    ED Course  Procedures (including critical care time) Labs Review Labs Reviewed  COMPREHENSIVE METABOLIC PANEL - Abnormal; Notable for the following:    Chloride 94 (*)    Alkaline Phosphatase 121 (*)    Total Bilirubin <0.2 (*)    GFR calc non Af Amer 67 (*)     GFR calc Af Amer 78 (*)    All other components within normal limits  SALICYLATE LEVEL - Abnormal; Notable for the following:    Salicylate Lvl <7.4 (*)    All other components within normal limits  URINE RAPID DRUG SCREEN (HOSP PERFORMED) - Abnormal; Notable for the following:    Benzodiazepines POSITIVE (*)    All other components within normal limits  ACETAMINOPHEN LEVEL  CBC  ETHANOL  PHENYTOIN LEVEL, TOTAL   Imaging Review No  results found.  EKG Interpretation   None       MDM   1. Depression   2. Suicidal ideation     11:39 PM Pt medically cleared.  Will need inpatient admission for SI and depression. Pt has a plan. Order placed for TTS to assess. Pt is here voluntarily.   Filed Vitals:   01/20/14 1939 01/20/14 2140  BP: 123/91 139/83  Pulse: 97 92  Temp: 99.8 F (37.7 C)   TempSrc: Oral   Resp: 20 20  SpO2: 96% 96%       Renold Genta, PA-C 01/23/14 [id number]

## 2014-01-21 ENCOUNTER — Inpatient Hospital Stay (HOSPITAL_COMMUNITY)
Admission: AD | Admit: 2014-01-21 | Discharge: 2014-01-27 | DRG: 885 | Disposition: A | Discharge status: Home / Self Care | Payer: Medicaid Other | Source: Intra-hospital | Attending: Psychiatry | Admitting: Psychiatry

## 2014-01-21 ENCOUNTER — Encounter (HOSPITAL_COMMUNITY): Payer: Self-pay | Admitting: Behavioral Health

## 2014-01-21 DIAGNOSIS — K219 Gastro-esophageal reflux disease without esophagitis: Secondary | ICD-10-CM | POA: Diagnosis present

## 2014-01-21 DIAGNOSIS — F332 Major depressive disorder, recurrent severe without psychotic features: Principal | ICD-10-CM | POA: Diagnosis present

## 2014-01-21 DIAGNOSIS — F1320 Sedative, hypnotic or anxiolytic dependence, uncomplicated: Secondary | ICD-10-CM

## 2014-01-21 DIAGNOSIS — Z598 Other problems related to housing and economic circumstances: Secondary | ICD-10-CM

## 2014-01-21 DIAGNOSIS — Z5989 Other problems related to housing and economic circumstances: Secondary | ICD-10-CM

## 2014-01-21 DIAGNOSIS — I10 Essential (primary) hypertension: Secondary | ICD-10-CM | POA: Diagnosis present

## 2014-01-21 DIAGNOSIS — R45851 Suicidal ideations: Secondary | ICD-10-CM

## 2014-01-21 DIAGNOSIS — Z22322 Carrier or suspected carrier of Methicillin resistant Staphylococcus aureus: Secondary | ICD-10-CM

## 2014-01-21 DIAGNOSIS — G4700 Insomnia, unspecified: Secondary | ICD-10-CM | POA: Diagnosis present

## 2014-01-21 DIAGNOSIS — Z5987 Material hardship due to limited financial resources, not elsewhere classified: Secondary | ICD-10-CM

## 2014-01-21 DIAGNOSIS — Z8669 Personal history of other diseases of the nervous system and sense organs: Secondary | ICD-10-CM

## 2014-01-21 DIAGNOSIS — F1120 Opioid dependence, uncomplicated: Secondary | ICD-10-CM | POA: Diagnosis present

## 2014-01-21 DIAGNOSIS — F322 Major depressive disorder, single episode, severe without psychotic features: Secondary | ICD-10-CM | POA: Diagnosis present

## 2014-01-21 MED ORDER — DICYCLOMINE HCL 20 MG PO TABS: 20.0000 mg | ORAL_TABLET | Freq: Four times a day (QID) | ORAL | Status: AC | PRN

## 2014-01-21 MED ORDER — CLONIDINE HCL 0.1 MG PO TABS
0.1000 mg | ORAL_TABLET | ORAL | Status: AC
  Administered 2014-01-24 – 2014-01-25 (×2): 0.1 mg via ORAL
  Filled 2014-01-21 (×4): qty 1

## 2014-01-21 MED ORDER — CLONAZEPAM 1 MG PO TABS: 5.0000 mg | ORAL_TABLET | Freq: Two times a day (BID) | ORAL | Status: DC

## 2014-01-21 MED ORDER — CLONIDINE HCL 0.1 MG PO TABS
0.1000 mg | ORAL_TABLET | Freq: Four times a day (QID) | ORAL | Status: AC
  Administered 2014-01-21: 0.1 mg via ORAL
  Filled 2014-01-21 (×11): qty 1

## 2014-01-21 MED ORDER — METHOCARBAMOL 500 MG PO TABS: 500.0000 mg | ORAL_TABLET | Freq: Three times a day (TID) | ORAL | Status: AC | PRN

## 2014-01-21 MED ORDER — QUETIAPINE FUMARATE 300 MG PO TABS
900.0000 mg | ORAL_TABLET | Freq: Every day | ORAL | Status: DC
  Administered 2014-01-22 – 2014-01-24 (×3): 900 mg via ORAL
  Filled 2014-01-21 (×7): qty 3

## 2014-01-21 MED ORDER — CLONAZEPAM 1 MG PO TABS
3.0000 mg | ORAL_TABLET | Freq: Two times a day (BID) | ORAL | Status: DC
  Administered 2014-01-21: 3 mg via ORAL
  Filled 2014-01-21: qty 3

## 2014-01-21 MED ORDER — CLONIDINE HCL 0.1 MG PO TABS
0.1000 mg | ORAL_TABLET | Freq: Every day | ORAL | Status: DC
  Filled 2014-01-21 (×2): qty 1

## 2014-01-21 MED ORDER — HYDROXYZINE HCL 25 MG PO TABS
25.0000 mg | ORAL_TABLET | Freq: Four times a day (QID) | ORAL | Status: DC | PRN
  Administered 2014-01-25: 25 mg via ORAL
  Filled 2014-01-21: qty 1

## 2014-01-21 MED ORDER — ALUM & MAG HYDROXIDE-SIMETH 200-200-20 MG/5ML PO SUSP: 30.0000 mL | ORAL | Status: DC | PRN

## 2014-01-21 MED ORDER — NAPROXEN 500 MG PO TABS: 500.0000 mg | ORAL_TABLET | Freq: Two times a day (BID) | ORAL | Status: AC | PRN

## 2014-01-21 MED ORDER — ONDANSETRON 4 MG PO TBDP: 4.0000 mg | ORAL_TABLET | Freq: Four times a day (QID) | ORAL | Status: AC | PRN

## 2014-01-21 MED ORDER — LOPERAMIDE HCL 2 MG PO CAPS: 2.0000 mg | ORAL_CAPSULE | ORAL | Status: AC | PRN

## 2014-01-21 MED ORDER — PHENYTOIN 50 MG PO CHEW
200.0000 mg | CHEWABLE_TABLET | Freq: Every day | ORAL | Status: DC
  Administered 2014-01-21 – 2014-01-26 (×6): 200 mg via ORAL
  Filled 2014-01-21 (×10): qty 4

## 2014-01-21 MED ORDER — TESTOSTERONE CYPIONATE 200 MG/ML IM SOLN: 400.0000 mg | INTRAMUSCULAR | Status: DC

## 2014-01-21 MED ORDER — SIMVASTATIN 40 MG PO TABS
40.0000 mg | ORAL_TABLET | Freq: Every day | ORAL | Status: DC
  Administered 2014-01-22 – 2014-01-26 (×5): 40 mg via ORAL
  Filled 2014-01-21 (×8): qty 1
  Filled 2014-01-21: qty 2

## 2014-01-21 MED ORDER — PANTOPRAZOLE SODIUM 40 MG PO TBEC
40.0000 mg | DELAYED_RELEASE_TABLET | Freq: Every day | ORAL | Status: DC
  Administered 2014-01-23 – 2014-01-27 (×5): 40 mg via ORAL
  Filled 2014-01-21 (×8): qty 1

## 2014-01-21 MED ORDER — AMITRIPTYLINE HCL 50 MG PO TABS
450.0000 mg | ORAL_TABLET | Freq: Every day | ORAL | Status: DC
  Administered 2014-01-22 – 2014-01-26 (×5): 450 mg via ORAL
  Filled 2014-01-21 (×10): qty 1

## 2014-01-21 MED ORDER — METOPROLOL SUCCINATE ER 50 MG PO TB24
150.0000 mg | ORAL_TABLET | Freq: Every day | ORAL | Status: DC
  Administered 2014-01-23 – 2014-01-27 (×5): 150 mg via ORAL
  Filled 2014-01-21 (×8): qty 1

## 2014-01-21 MED ORDER — MAGNESIUM HYDROXIDE 400 MG/5ML PO SUSP: 30.0000 mL | Freq: Every day | ORAL | Status: DC | PRN

## 2014-01-21 MED ORDER — LISINOPRIL 5 MG PO TABS
5.0000 mg | ORAL_TABLET | Freq: Every day | ORAL | Status: DC
Start: 2014-01-22 — End: 2014-01-27
  Administered 2014-01-24 – 2014-01-27 (×4): 5 mg via ORAL
  Filled 2014-01-21 (×8): qty 1

## 2014-01-21 NOTE — Progress Notes (Signed)
Patient has been referred to [facility].  Per [facility] Randall Hiss) there may possibly be discharges this afternoon.

## 2014-01-21 NOTE — Tx Team (Signed)
Initial Interdisciplinary Treatment Plan  PATIENT STRENGTHS: (choose at least two) Capable of independent living [place] of knowledge  PATIENT STRESSORS: Health problems   PROBLEM LIST: Problem List/Patient Goals Date to be addressed Date deferred Reason deferred Estimated date of resolution  Depression "I want to stay in the bed all the time 01/21/2014     SI "I have research ways to do it peacefully but I wont do it." 01/21/2014     Anxiety 01/21/2014                                          DISCHARGE CRITERIA:  Ability to meet basic life and health needs Improved stabilization in mood, thinking, and/or behavior Need for constant or close observation no longer present Safe-care adequate arrangements made Withdrawal symptoms are absent or subacute and managed without 24-hour nursing intervention  PRELIMINARY DISCHARGE PLAN: Attend aftercare/continuing care group Outpatient therapy Return to previous living arrangement  PATIENT/FAMIILY INVOLVEMENT: This treatment plan has been presented to and reviewed with the patient, [name].  The patient and family have been given the opportunity to ask questions and make suggestions.  [name] 01/21/2014, 5:37 PM

## 2014-01-21 NOTE — Progress Notes (Signed)
58 y/o male who presents voluntarily for complaints of depression.  Patient states he has been depressed and staying at home in bed.  Patient appears to be fixated on his medications and when asked questions about why he is here patient states he wants to make sure he is not taken off of his xanax because he has to have it.  Also patient states several times that he metabolized medications rapidly so he has to take large doses.  Patient states he has no family or support system.  Patient passive SI but verbally contracts for safety.  Patient denies HI and denies AVH.  Patient medical history: seizures, GERD, depression and anxiety.  Patient skin searched and he has old self inflicted cuts to right arm, surgical scar to left shoulder.  Consents obtained, fall safety plan explained and patient verbalized understanding.  Patient escorted and oriented to the unit.  Patient offered no additional questions or concerns.

## 2014-01-21 NOTE — Progress Notes (Signed)
Adult Psychoeducational Group Note  Date:  01/21/2014 Time:  11:06 PM  Group Topic/Focus:  Goals Group:   The focus of this group is to help patients establish daily goals to achieve during treatment and discuss how the patient can incorporate goal setting into their daily lives to aide in recovery.  Participation Level:  Active  Participation Quality:  Appropriate  Affect:  Appropriate  Cognitive:  Appropriate  Insight: Appropriate  Engagement in Group:  Engaged  Modes of Intervention:  Discussion  Additional Comments:  Pt stated that the day was so so I waiting to see how his meds go.  Alexis Goodell R 01/21/2014, 11:06 PM

## 2014-01-21 NOTE — Progress Notes (Signed)
Pt refused medications after taking them out of packaging, Pt stated If he could not get his klonopin he was not going to take anything.

## 2014-01-21 NOTE — Progress Notes (Addendum)
Patient has been accepted to [facility] Bed 508-1.  Support paperwork has been faxed to [facility].  Dr. Louretta Shorten is the accepting doctor.  The nurse will arrange transportation through [place].

## 2014-01-21 NOTE — Progress Notes (Signed)
D: Pt denies SI/HI/AVH. Pt obsessed with his medications.Pt became angry and agitated and did not want to take his medications. Pt did not want to discuss anything with Probation officer. Pt up all night.     A: Pt was offered support and encouragement. Pt was given scheduled medications. Pt was encourage to attend groups. Q 15 minute checks were done for safety.   R:Pt attends groups and interacts  with peers and staff. Pt is taking medication. Pt  safety maintained on unit.

## 2014-01-21 NOTE — Consult Note (Signed)
[place] Psychiatry Consult   Reason for Consult:  Suicidal threats Referring Physician:  ER MD  [name] is an 58 y.o. male. Total Time spent with patient: 1 hour  Assessment: AXIS I:  Major Depression, Recurrent severe AXIS II:  Deferred AXIS III:   Past Medical History  Diagnosis Date  . Seizures   . Hypertension   . GERD (gastroesophageal reflux disease)   . Dyslipidemia   . Schizoaffective disorder   . Depression   . Anxiety   . MRSA carrier    AXIS IV:  economic problems AXIS V:  41-50 serious symptoms  Plan:  Recommend psychiatric Inpatient admission when medically cleared.  Subjective:   [name] is a 58 y.o. male patient admitted with suicidal threats.  HPI:  [name] says he has been depressed for many years.  No medications have ever worked well enough to make him not suicidal.  He is on large doses of medications but is still suicidal.  Says he cannot get out of the bed.  Feels hopeless that nothing will ever change.  Chief complaint was " I was thinking about killing myself yesterday" and he is still thinking about it today with a plan to kill himself with helium and has bought the tanks and masks to do it.  He  Lives by himself and has no support, he says.   He says he has been hospitalized at least 20 times over the years.  HPI Elements:   Location:  depresion. Quality:  suicidal. Severity:  hopeless. Timing:  no precipitants. Duration:  "decades". Context:  no precipitants.  Past Psychiatric History: Past Medical History  Diagnosis Date  . Seizures   . Hypertension   . GERD (gastroesophageal reflux disease)   . Dyslipidemia   . Schizoaffective disorder   . Depression   . Anxiety   . MRSA carrier     reports that he has never smoked. He has never used smokeless tobacco. He reports that he does not drink alcohol or use illicit drugs. Family History  Problem Relation Age of Onset  . Stroke Father     Living at 69  . Stroke  Mother     Stroke in late 48's. Died in her 40's           Allergies:   Allergies  Allergen Reactions  . Codeine Nausea Only  . Sulfa Antibiotics Nausea And Vomiting    ACT Assessment Complete:  Yes:    Educational Status    Risk to Self: Risk to self Is patient at risk for suicide?: Yes Substance abuse history and/or treatment for substance abuse?: No  Risk to Others:    Abuse:    Prior Inpatient Therapy:    Prior Outpatient Therapy:    Additional Information:                    Objective: Blood pressure 90/58, pulse 77, temperature 97 F (36.1 C), temperature source Oral, resp. rate 18, SpO2 97.00%.There is no weight on file to calculate BMI. Results for orders placed during the hospital encounter of 01/20/14 (from the past 72 hour(s))  ACETAMINOPHEN LEVEL     Status: None   Collection Time    01/20/14  8:24 PM      Result Value Range   Acetaminophen (Tylenol), Serum <15.0  10 - 30 ug/mL   Comment:            THERAPEUTIC CONCENTRATIONS VARY     SIGNIFICANTLY.  A RANGE OF 10-30     ug/mL MAY BE AN EFFECTIVE     CONCENTRATION FOR MANY PATIENTS.     HOWEVER, SOME ARE BEST TREATED     AT CONCENTRATIONS OUTSIDE THIS     RANGE.     ACETAMINOPHEN CONCENTRATIONS     >150 ug/mL AT 4 HOURS AFTER     INGESTION AND >50 ug/mL AT 12     HOURS AFTER INGESTION ARE     OFTEN ASSOCIATED WITH TOXIC     REACTIONS.  CBC     Status: None   Collection Time    01/20/14  8:24 PM      Result Value Range   WBC 9.3  4.0 - 10.5 K/uL   RBC 4.75  4.22 - 5.81 MIL/uL   Hemoglobin 15.5  13.0 - 17.0 g/dL   HCT 43.5  39.0 - 52.0 %   MCV 91.6  78.0 - 100.0 fL   MCH 32.6  26.0 - 34.0 pg   MCHC 35.6  30.0 - 36.0 g/dL   RDW 13.7  11.5 - 15.5 %   Platelets 209  150 - 400 K/uL  COMPREHENSIVE METABOLIC PANEL     Status: Abnormal   Collection Time    01/20/14  8:24 PM      Result Value Range   Sodium 137  137 - 147 mEq/L   Potassium 4.2  3.7 - 5.3 mEq/L   Chloride 94 (*) 96 -  112 mEq/L   CO2 30  19 - 32 mEq/L   Glucose, Bld 87  70 - 99 mg/dL   BUN 16  6 - 23 mg/dL   Creatinine, Ser 1.16  0.50 - 1.35 mg/dL   Calcium 9.7  8.4 - 10.5 mg/dL   Total Protein 8.1  6.0 - 8.3 g/dL   Albumin 4.6  3.5 - 5.2 g/dL   AST 26  0 - 37 U/L   ALT 28  0 - 53 U/L   Alkaline Phosphatase 121 (*) 39 - 117 U/L   Total Bilirubin <0.2 (*) 0.3 - 1.2 mg/dL   GFR calc non Af Amer 67 (*) >90 mL/min   GFR calc Af Amer 78 (*) >90 mL/min   Comment: (NOTE)     The eGFR has been calculated using the CKD EPI equation.     This calculation has not been validated in all clinical situations.     eGFR's persistently <90 mL/min signify possible Chronic Kidney     Disease.  ETHANOL     Status: None   Collection Time    01/20/14  8:24 PM      Result Value Range   Alcohol, Ethyl (B) <11  0 - 11 mg/dL   Comment:            LOWEST DETECTABLE LIMIT FOR     SERUM ALCOHOL IS 11 mg/dL     FOR MEDICAL PURPOSES ONLY  SALICYLATE LEVEL     Status: Abnormal   Collection Time    01/20/14  8:24 PM      Result Value Range   Salicylate Lvl <8.0 (*) 2.8 - 20.0 mg/dL  PHENYTOIN LEVEL, TOTAL     Status: None   Collection Time    01/20/14  8:24 PM      Result Value Range   Phenytoin Lvl 19.4  10.0 - 20.0 ug/mL   Comment: Performed at [facility] ([place])     Status: Abnormal  Collection Time    01/20/14  9:33 PM      Result Value Range   Opiates NONE DETECTED  NONE DETECTED   Cocaine NONE DETECTED  NONE DETECTED   Benzodiazepines POSITIVE (*) NONE DETECTED   Amphetamines NONE DETECTED  NONE DETECTED   Tetrahydrocannabinol NONE DETECTED  NONE DETECTED   Barbiturates NONE DETECTED  NONE DETECTED   Comment:            DRUG SCREEN FOR MEDICAL PURPOSES     ONLY.  IF CONFIRMATION IS NEEDED     FOR ANY PURPOSE, NOTIFY LAB     WITHIN 5 DAYS.                LOWEST DETECTABLE LIMITS     FOR URINE DRUG SCREEN     Drug Class       Cutoff (ng/mL)     Amphetamine       1000     Barbiturate      200     Benzodiazepine   086     Tricyclics       578     Opiates          300     Cocaine          300     THC              50   Labs are reviewed and are pertinent for no psychiatric issues.  Current Facility-Administered Medications  Medication Dose Route Frequency Provider Last Rate Last Dose  . acetaminophen (TYLENOL) tablet 650 mg  650 mg Oral Q4H PRN Tatyana A Kirichenko, PA-C      . amitriptyline (ELAVIL) tablet 450 mg  450 mg Oral QHS Merryl Hacker, MD   450 mg at 01/20/14 2230  . clonazePAM (KLONOPIN) tablet 3 mg  3 mg Oral BID Clarene Reamer, MD   3 mg at 01/21/14 1031  . lisinopril (PRINIVIL,ZESTRIL) tablet 5 mg  5 mg Oral Daily Tatyana A Kirichenko, PA-C   5 mg at 01/20/14 2230  . metoprolol succinate (TOPROL-XL) 24 hr tablet 150 mg  150 mg Oral Daily Tatyana A Kirichenko, PA-C   150 mg at 01/20/14 2230  . ondansetron (ZOFRAN) tablet 4 mg  4 mg Oral Q8H PRN Tatyana A Kirichenko, PA-C      . pantoprazole (PROTONIX) EC tablet 40 mg  40 mg Oral Daily Tatyana A Kirichenko, PA-C   40 mg at 01/21/14 1031  . phenytoin (DILANTIN) chewable tablet 200 mg  200 mg Oral QHS Tatyana A Kirichenko, PA-C   200 mg at 01/20/14 2242  . QUEtiapine (SEROQUEL) tablet 900 mg  900 mg Oral QHS Tatyana A Kirichenko, PA-C   900 mg at 01/20/14 2235  . simvastatin (ZOCOR) tablet 40 mg  40 mg Oral QHS Tatyana A Kirichenko, PA-C   40 mg at 01/20/14 2230  . testosterone cypionate (DEPOTESTOTERONE CYPIONATE) injection 400 mg  400 mg Intramuscular Q14 Days Tatyana A Kirichenko, PA-C   400 mg at 01/20/14 2312   Current Outpatient Prescriptions  Medication Sig Dispense Refill  . ALPRAZolam (XANAX) 1 MG tablet Take 1 tablet (1 mg total) by mouth 3 (three) times daily as needed for anxiety.  30 tablet  0  . amitriptyline (ELAVIL) 150 MG tablet Take 450 mg by mouth at bedtime.      . clonazePAM (KLONOPIN) 2 MG tablet Take 2.5 tablets (5 mg total) by mouth 2 (two) times daily.  150  tablet  3  . lisinopril (PRINIVIL,ZESTRIL) 5 MG tablet Take 1 tablet (5 mg total) by mouth daily.  90 tablet  3  . metoprolol succinate (TOPROL XL) 50 MG 24 hr tablet Take 3 tablets (150 mg total) by mouth daily. Take with or immediately following a meal.  270 tablet  3  . Multiple Vitamins-Minerals (MULTIVITAMIN WITH MINERALS) tablet Take 1 tablet by mouth daily.      Marland Kitchen omeprazole (PRILOSEC) 20 MG capsule Take 20 mg by mouth daily as needed. For heartburn or acid reflux.      . phenytoin (DILANTIN) 50 MG tablet Chew 4 tablets (200 mg total) by mouth at bedtime.  120 tablet  11  . polyethylene glycol (MIRALAX / GLYCOLAX) packet Take 17 g by mouth daily as needed. For constipation. Mix into 8 ounces of fluid & drink  30 each  6  . QUEtiapine (SEROQUEL) 300 MG tablet Take 900 mg by mouth at bedtime.       . simvastatin (ZOCOR) 40 MG tablet Take 40 mg by mouth at bedtime.      Marland Kitchen testosterone cypionate (DEPOTESTOTERONE CYPIONATE) 200 MG/ML injection Inject 400 mg into the muscle every 14 (fourteen) days.        Psychiatric Specialty Exam:     Blood pressure 90/58, pulse 77, temperature 97 F (36.1 C), temperature source Oral, resp. rate 18, SpO2 97.00%.There is no weight on file to calculate BMI.  General Appearance: Casual  Eye Contact::  Poor  Speech:  hard to understand, seemed sedated  Volume:  Decreased  Mood:  Depressed and Irritable  Affect:  Congruent  Thought Process:  Coherent and Logical  Orientation:  Full (Time, Place, and Person)  Thought Content:  Negative  Suicidal Thoughts:  Yes.  with intent/plan  Homicidal Thoughts:  No  Memory:  Immediate;   Good Recent;   Good Remote;   Good  Judgement:  Impaired  Insight:  Fair  Psychomotor Activity:  Normal  Concentration:  Good  Recall:  Good  Fund of Knowledge:Good  Language: Good  Akathisia:  Negative  Handed:  Right  AIMS (if indicated):     Assets:  Communication Skills Housing  Sleep:       Musculoskeletal: Strength & Muscle Tone: within normal limits Gait & Station: normal Patient leans: N/A  Treatment Plan Summary: Has been accepted at [facility] inpatient though bed number has not been released yet for treatment of his depression and suicidality  Ammie Warrick D 01/21/2014 12:52 PM

## 2014-01-21 NOTE — BHH Counselor (Signed)
This Probation officer attempted to interview pt, however pt continues to incoherent.  Pt will need to interviewed at a later time by psych/extender in the AM.

## 2014-01-21 NOTE — ED Notes (Signed)
Patient talking with Probation officer and reminiscing about his past. Patient is calm but cooperative. Patient reports that he worries about his future and is depressed because one day he might not be able to afford his medication. Patient reports that he was unable to have a family due to illness leaving him on disability. Encouragement and support provided. Safety maintain.

## 2014-01-21 NOTE — Consult Note (Signed)
  Review of Systems  Constitutional: Negative.   HENT: Negative.   Eyes: Negative.   Respiratory: Negative.   Cardiovascular: Negative.   Gastrointestinal: Negative.   Genitourinary: Negative.   Musculoskeletal: Negative.   Skin: Negative.   Neurological: Negative.   Endo/Heme/Allergies: Negative.   Psychiatric/Behavioral: Positive for depression and suicidal ideas.   Says he has cardiovascular and neurological issues but no current aches or pains

## 2014-01-22 ENCOUNTER — Encounter (HOSPITAL_COMMUNITY): Payer: Self-pay | Admitting: Psychiatry

## 2014-01-22 DIAGNOSIS — F1120 Opioid dependence, uncomplicated: Secondary | ICD-10-CM

## 2014-01-22 DIAGNOSIS — F332 Major depressive disorder, recurrent severe without psychotic features: Principal | ICD-10-CM

## 2014-01-22 LAB — URINALYSIS, ROUTINE W REFLEX MICROSCOPIC
Bilirubin Urine: NEGATIVE
Glucose, UA: NEGATIVE mg/dL
Hgb urine dipstick: NEGATIVE
Ketones, ur: NEGATIVE mg/dL
Leukocytes, UA: NEGATIVE
Nitrite: NEGATIVE
Protein, ur: NEGATIVE mg/dL
Specific Gravity, Urine: 1.005 (ref 1.005–1.030)
Urobilinogen, UA: 0.2 mg/dL (ref 0.0–1.0)
pH: 6.5 (ref 5.0–8.0)

## 2014-01-22 MED ORDER — CLONAZEPAM 1 MG PO TABS
2.0000 mg | ORAL_TABLET | Freq: Two times a day (BID) | ORAL | Status: DC
  Administered 2014-01-22 – 2014-01-25 (×7): 2 mg via ORAL
  Filled 2014-01-22 (×7): qty 2

## 2014-01-22 MED ORDER — POLYETHYLENE GLYCOL 3350 17 G PO PACK
17.0000 g | PACK | Freq: Every day | ORAL | Status: DC
  Administered 2014-01-22 – 2014-01-27 (×6): 17 g via ORAL
  Filled 2014-01-22 (×8): qty 1

## 2014-01-22 NOTE — Progress Notes (Signed)
[name] has many  manic behaviors that come and go, ie..he can be quiet and rest in his bed for a few hours, then when he gets OOB, you can hear his booming voice as he comes down the  [place]. His speech is tangential and he frequently speaks so fast his thoughts and words run into each other.....making it sometimes a challenge to understand what he is saying.    A He takes his meds as scheduled, he attends his groups and he is trying to understand the seriousness of his disease process.    R Safety is in place and poc moves forward.

## 2014-01-22 NOTE — Progress Notes (Signed)
D: Pt denies SI/HI/AVH. Pt is pleasant and cooperative. Pt continues to exhibit manic behaviors. Pt did agree to take his medications tonight, all but the clonidine. The NP said he could have a second dose of Klonopin, so that was given also.   A: Pt was offered support and encouragement. Pt was given scheduled medications. Pt was encourage to attend groups. Q 15 minute checks were done for safety.  R:Pt attends groups and interacts  with peers and staff.Pt receptive to treatment and safety maintained on unit.

## 2014-01-22 NOTE — BHH Suicide Risk Assessment (Signed)
[place] INPATIENT:  Family/Significant Other Suicide Prevention Education  Suicide Prevention Education:  Patient Refusal for Family/Significant Other Suicide Prevention Education: The patient [name] has refused to provide written consent for family/significant other to be provided Family/Significant Other Suicide Prevention Education during admission and/or prior to discharge.  Physician notified.  Patient advised MD and write of no family or other support system.  Concha Pyo 01/22/2014, 1:03 PM

## 2014-01-22 NOTE — B[facility] Group Notes (Signed)
[place] LCSW Group Therapy  Feelings Around Relapse 1:15 - 2:30 PM  01/22/2014 2:43 PM  Type of Therapy:  Group Therapy  Participation Level:  Did Not Attend  Concha Pyo 01/22/2014, 2:43 PM

## 2014-01-22 NOTE — H&P (Signed)
Psychiatric Admission Assessment Adult  Patient Identification:  [name] Date of Evaluation:  01/22/2014 Chief Complaint:  MAJOR DEPRESSIVE DISORDER History of Present Illness:  58 y/o male who presents voluntarily for complaints of depression with suicidal ideations. Patient states he has been depressed and staying at home in bed. Patient appears to be fixated on his medications and when asked questions about why he is here patient states he wants to make sure he is not taken off of his xanax because he has to have it. Also patient states several times that he metabolized medications rapidly so he has to take large doses. Patient states he has no family or support system. Patient passive SI but verbally contracts for safety. Patient denies HI and denies AVH. Patient medical history: seizures, GERD, depression and anxiety. Patient skin searched and he has old self inflicted cuts to right arm, surgical scar to left shoulder.   Elements:  Location:  generalized. Quality:  acute. Severity:  severe. Timing:  constant . Duration:  couple of weeks. Context:  stressors. Associated Signs/Synptoms: Depression Symptoms:  depressed mood, anhedonia, psychomotor retardation, suicidal thoughts with specific plan, disturbed sleep, increased appetite, (Hypo) Manic Symptoms:  Impulsivity, Irritable Mood, Anxiety Symptoms:  None Psychotic Symptoms:  None PTSD Symptoms: NA Total Time spent with patient: 30 minutes  Psychiatric Specialty Exam: Physical Exam  Constitutional: He is oriented to person, place, and time. He appears well-developed and well-nourished.  HENT:  Head: Normocephalic and atraumatic.  Neck: Normal range of motion.  Respiratory: Effort normal.  GI: Soft.  Musculoskeletal: Normal range of motion.  Neurological: He is alert and oriented to person, place, and time.  Skin: Skin is warm.    Review of Systems  Constitutional: Negative.   HENT: Negative.   Eyes: Negative.    Respiratory: Negative.   Cardiovascular: Negative.   Gastrointestinal: Negative.   Genitourinary: Negative.   Musculoskeletal: Negative.   Skin: Negative.   Neurological: Negative.   Endo/Heme/Allergies: Negative.   Psychiatric/Behavioral: Positive for depression, suicidal ideas and substance abuse. The patient is nervous/anxious.     Blood pressure 107/76, pulse 108, temperature 97.2 F (36.2 C), temperature source Oral, resp. rate 20, height 5' 11"  (1.803 m), weight 86.637 kg (191 lb).Body mass index is 26.65 kg/(m^2).  General Appearance: Casual  Eye Contact::  Fair  Speech:  Normal Rate  Volume:  Decreased  Mood:  Depressed, Irritable  Affect:  Congruent  Thought Process:  Coherent  Orientation:  Full (Time, Place, and Person)  Thought Content:  WDL  Suicidal Thoughts:  Yes.  with intent/plan  Homicidal Thoughts:  No  Memory:  Immediate;   Poor Recent;   Poor Remote;   Poor  Judgement:  Poor  Insight:  Lacking  Psychomotor Activity:  Decreased  Concentration:  Poor  Recall:  Poor  Fund of Knowledge:Fair  Language: Fair  Akathisia:  No  Handed:  Right  AIMS (if indicated):     Assets:  Resilience  Sleep:  Number of Hours: 3    Musculoskeletal: Strength & Muscle Tone: within normal limits Gait & Station: normal Patient leans: N/A  Past Psychiatric History: Diagnosis:  Depression, OCD  Hospitalizations:  4-5 in [place]:  [facility]  Substance Abuse Care:  None  Self-Mutilation:  Cutter in the past  Suicidal Attempts:    Violent Behaviors:  None   Past Medical History:   Past Medical History  Diagnosis Date  . Seizures   . Hypertension   . GERD (gastroesophageal  reflux disease)   . Dyslipidemia   . Schizoaffective disorder   . Depression   . Anxiety   . MRSA carrier    Seizure History:  History of seizures Allergies:   Allergies  Allergen Reactions  . Codeine Nausea Only  . Sulfa Antibiotics Nausea And Vomiting   PTA  Medications: Prescriptions prior to admission  Medication Sig Dispense Refill  . ALPRAZolam (XANAX) 1 MG tablet Take 1 tablet (1 mg total) by mouth 3 (three) times daily as needed for anxiety.  30 tablet  0  . amitriptyline (ELAVIL) 150 MG tablet Take 450 mg by mouth at bedtime.      Marland Kitchen lisinopril (PRINIVIL,ZESTRIL) 5 MG tablet Take 1 tablet (5 mg total) by mouth daily.  90 tablet  3  . metoprolol succinate (TOPROL XL) 50 MG 24 hr tablet Take 3 tablets (150 mg total) by mouth daily. Take with or immediately following a meal.  270 tablet  3  . Multiple Vitamins-Minerals (MULTIVITAMIN WITH MINERALS) tablet Take 1 tablet by mouth daily.      Marland Kitchen omeprazole (PRILOSEC) 20 MG capsule Take 20 mg by mouth daily as needed. For heartburn or acid reflux.      . phenytoin (DILANTIN) 50 MG tablet Chew 4 tablets (200 mg total) by mouth at bedtime.  120 tablet  11  . polyethylene glycol (MIRALAX / GLYCOLAX) packet Take 17 g by mouth daily as needed. For constipation. Mix into 8 ounces of fluid & drink  30 each  6  . QUEtiapine (SEROQUEL) 300 MG tablet Take 900 mg by mouth at bedtime.       . simvastatin (ZOCOR) 40 MG tablet Take 40 mg by mouth at bedtime.      Marland Kitchen testosterone cypionate (DEPOTESTOTERONE CYPIONATE) 200 MG/ML injection Inject 400 mg into the muscle every 14 (fourteen) days.      . clonazePAM (KLONOPIN) 2 MG tablet Take 2.5 tablets (5 mg total) by mouth 2 (two) times daily.  150 tablet  3    Previous Psychotropic Medications:  Medication/Dose   See above, SSRIs and Wellbutrin increase his seizures   Substance Abuse History in the last 12 months:  yes  Consequences of Substance Abuse: NA  Social History:  reports that he has never smoked. He has never used smokeless tobacco. He reports that he does not drink alcohol or use illicit drugs. Additional Social History: Pain Medications: n/a Prescriptions: see pta med list Over the Counter: n/a History of alcohol / drug use?: No history of  alcohol / drug abuse    Current Place of Residence:   Place of Birth:   Family Members: Marital Status:  Single Children:  Sons:  Daughters: Relationships: Education:  Dentist Problems/Performance: Religious Beliefs/Practices: History of Abuse (Emotional/Phsycial/Sexual) Occupational Experiences; Military History:  None. Legal History: Hobbies/Interests:  Family History:   Family History  Problem Relation Age of Onset  . Stroke Father     Living at 51  . Stroke Mother     Stroke in late 33's. Died in her 82's    Results for orders placed during the hospital encounter of 01/20/14 (from the past 72 hour(s))  ACETAMINOPHEN LEVEL     Status: None   Collection Time    01/20/14  8:24 PM      Result Value Range   Acetaminophen (Tylenol), Serum <15.0  10 - 30 ug/mL   Comment:            THERAPEUTIC CONCENTRATIONS VARY  SIGNIFICANTLY. A RANGE OF 10-30     ug/mL MAY BE AN EFFECTIVE     CONCENTRATION FOR MANY PATIENTS.     HOWEVER, SOME ARE BEST TREATED     AT CONCENTRATIONS OUTSIDE THIS     RANGE.     ACETAMINOPHEN CONCENTRATIONS     >150 ug/mL AT 4 HOURS AFTER     INGESTION AND >50 ug/mL AT 12     HOURS AFTER INGESTION ARE     OFTEN ASSOCIATED WITH TOXIC     REACTIONS.  CBC     Status: None   Collection Time    01/20/14  8:24 PM      Result Value Range   WBC 9.3  4.0 - 10.5 K/uL   RBC 4.75  4.22 - 5.81 MIL/uL   Hemoglobin 15.5  13.0 - 17.0 g/dL   HCT 43.5  39.0 - 52.0 %   MCV 91.6  78.0 - 100.0 fL   MCH 32.6  26.0 - 34.0 pg   MCHC 35.6  30.0 - 36.0 g/dL   RDW 13.7  11.5 - 15.5 %   Platelets 209  150 - 400 K/uL  COMPREHENSIVE METABOLIC PANEL     Status: Abnormal   Collection Time    01/20/14  8:24 PM      Result Value Range   Sodium 137  137 - 147 mEq/L   Potassium 4.2  3.7 - 5.3 mEq/L   Chloride 94 (*) 96 - 112 mEq/L   CO2 30  19 - 32 mEq/L   Glucose, Bld 87  70 - 99 mg/dL   BUN 16  6 - 23 mg/dL   Creatinine, Ser 1.16  0.50 - 1.35 mg/dL    Calcium 9.7  8.4 - 10.5 mg/dL   Total Protein 8.1  6.0 - 8.3 g/dL   Albumin 4.6  3.5 - 5.2 g/dL   AST 26  0 - 37 U/L   ALT 28  0 - 53 U/L   Alkaline Phosphatase 121 (*) 39 - 117 U/L   Total Bilirubin <0.2 (*) 0.3 - 1.2 mg/dL   GFR calc non Af Amer 67 (*) >90 mL/min   GFR calc Af Amer 78 (*) >90 mL/min   Comment: (NOTE)     The eGFR has been calculated using the CKD EPI equation.     This calculation has not been validated in all clinical situations.     eGFR's persistently <90 mL/min signify possible Chronic Kidney     Disease.  ETHANOL     Status: None   Collection Time    01/20/14  8:24 PM      Result Value Range   Alcohol, Ethyl (B) <11  0 - 11 mg/dL   Comment:            LOWEST DETECTABLE LIMIT FOR     SERUM ALCOHOL IS 11 mg/dL     FOR MEDICAL PURPOSES ONLY  SALICYLATE LEVEL     Status: Abnormal   Collection Time    01/20/14  8:24 PM      Result Value Range   Salicylate Lvl <4.2 (*) 2.8 - 20.0 mg/dL  PHENYTOIN LEVEL, TOTAL     Status: None   Collection Time    01/20/14  8:24 PM      Result Value Range   Phenytoin Lvl 19.4  10.0 - 20.0 ug/mL   Comment: Performed at [facility] ([place])     Status:  Abnormal   Collection Time    01/20/14  9:33 PM      Result Value Range   Opiates NONE DETECTED  NONE DETECTED   Cocaine NONE DETECTED  NONE DETECTED   Benzodiazepines POSITIVE (*) NONE DETECTED   Amphetamines NONE DETECTED  NONE DETECTED   Tetrahydrocannabinol NONE DETECTED  NONE DETECTED   Barbiturates NONE DETECTED  NONE DETECTED   Comment:            DRUG SCREEN FOR MEDICAL PURPOSES     ONLY.  IF CONFIRMATION IS NEEDED     FOR ANY PURPOSE, NOTIFY LAB     WITHIN 5 DAYS.                LOWEST DETECTABLE LIMITS     FOR URINE DRUG SCREEN     Drug Class       Cutoff (ng/mL)     Amphetamine      1000     Barbiturate      200     Benzodiazepine   672     Tricyclics       094     Opiates          300     Cocaine          300      THC              50   Psychological Evaluations:  Assessment:   DSM5:  Substance/Addictive Disorders:  Opioid Disorder - Severe (304.00) Depressive Disorders:  Major Depressive Disorder - Severe (296.23)  AXIS I:  Major Depression, Recurrent severe and Substance Abuse AXIS II:  Deferred AXIS III:   Past Medical History  Diagnosis Date  . Seizures   . Hypertension   . GERD (gastroesophageal reflux disease)   . Dyslipidemia   . Schizoaffective disorder   . Depression   . Anxiety   . MRSA carrier    AXIS IV:  economic problems, housing problems, other psychosocial or environmental problems, problems related to social environment and problems with primary support group AXIS V:  41-50 serious symptoms Treatment Plan/Recommendations:  Plan:  Review of chart, vital signs, medications, and notes. 1-Admit for crisis management and stabilization.  Estimated length of stay 5-7 days past his current stay of 1 2-Individual and group therapy encouraged 3-Medication management for depression, alcohol withdrawal/detox to reduce current symptoms to base line and improve the patient's overall level of functioning:  Medications reviewed with the patient and he stated no untoward effects, home medications in place and Clonidine protocol in place for detox from opiates 4-Coping skills for depression, substance abuse developing-- 5-Continue crisis stabilization and management 6-Address health issues--monitoring vital signs, stable  7-Treatment plan in progress to prevent relapse of depression, substance abuse 8-Psychosocial education regarding relapse prevention and self-care 9-Health care follow up as needed for any health concerns  10-Call for consult with hospitalist for additional specialty patient services as needed.  Treatment Plan Summary: Daily contact with patient to assess and evaluate symptoms and progress in treatment Medication management Current Medications:  Current  Facility-Administered Medications  Medication Dose Route Frequency Provider Last Rate Last Dose  . alum & mag hydroxide-simeth (MAALOX/MYLANTA) 200-200-20 MG/5ML suspension 30 mL  30 mL Oral Q4H PRN Clarene Reamer, MD      . amitriptyline (ELAVIL) tablet 450 mg  450 mg Oral QHS Clarene Reamer, MD      . clonazePAM [facility]) tablet 2 mg  2 mg Oral  BID Waylan Boga, NP      . cloNIDine (CATAPRES) tablet 0.1 mg  0.1 mg Oral QID Malena Peer, NP   0.1 mg at 01/21/14 2157   Followed by  . [START ON 01/24/2014] cloNIDine (CATAPRES) tablet 0.1 mg  0.1 mg Oral BH-qamhs Evanna Glenda Chroman, NP       Followed by  . [START ON 01/27/2014] cloNIDine (CATAPRES) tablet 0.1 mg  0.1 mg Oral QAC breakfast Evanna Glenda Chroman, NP      . dicyclomine (BENTYL) tablet 20 mg  20 mg Oral Q6H PRN Evanna Glenda Chroman, NP      . hydrOXYzine (ATARAX/VISTARIL) tablet 25 mg  25 mg Oral Q6H PRN Evanna Glenda Chroman, NP      . lisinopril (PRINIVIL,ZESTRIL) tablet 5 mg  5 mg Oral Daily Clarene Reamer, MD      . loperamide (IMODIUM) capsule 2-4 mg  2-4 mg Oral PRN Evanna Glenda Chroman, NP      . magnesium hydroxide (MILK OF MAGNESIA) suspension 30 mL  30 mL Oral Daily PRN Clarene Reamer, MD      . methocarbamol (ROBAXIN) tablet 500 mg  500 mg Oral Q8H PRN Evanna Glenda Chroman, NP      . metoprolol succinate (TOPROL-XL) 24 hr tablet 150 mg  150 mg Oral Daily Clarene Reamer, MD      . naproxen (NAPROSYN) tablet 500 mg  500 mg Oral BID PRN Malena Peer, NP      . ondansetron (ZOFRAN-ODT) disintegrating tablet 4 mg  4 mg Oral Q6H PRN Evanna Glenda Chroman, NP      . pantoprazole (PROTONIX) EC tablet 40 mg  40 mg Oral Daily Clarene Reamer, MD      . phenytoin (DILANTIN) chewable tablet 200 mg  200 mg Oral QHS Clarene Reamer, MD   200 mg at 01/21/14 2153  . polyethylene glycol (MIRALAX / GLYCOLAX) packet 17 g  17 g Oral Daily Waylan Boga, NP      . QUEtiapine (SEROQUEL) tablet 900 mg  900 mg Oral QHS Clarene Reamer, MD       . simvastatin (ZOCOR) tablet 40 mg  40 mg Oral QHS Clarene Reamer, MD      . Derrill Memo ON 02/03/2014] testosterone cypionate (DEPOTESTOTERONE CYPIONATE) injection 400 mg  400 mg Intramuscular Q14 Days Clarene Reamer, MD        Observation Level/Precautions:  15 minute checks  Laboratory:  completed, reviewed, stable--U/A ordered  Psychotherapy:  Individual and group therapy  Medications:  Elavil, Vistaril  Consultations:  None  Discharge Concerns:  NOne  Estimated LOS:  5-7 days  Other:     I certify that inpatient services furnished can reasonably be expected to improve the patient's condition.   Waylan Boga, PMH-NP 2/6/20152:56 PM  Patient is seen for face to face evaluation, case discussed with NP and formulated treatment plan. Reviewed the information documented and agree with the treatment plan.  Jaquita Bessire,JANARDHAHA R. 01/22/2014 5:45 PM

## 2014-01-22 NOTE — BHH Group Notes (Signed)
[facility] LCSW Aftercare Discharge Planning Group Note   01/22/2014 9:42 AM    Participation Quality:  Appropraite  Mood/Affect:  Appropriate  Depression Rating:  0  Anxiety Rating:  0  Thoughts of Suicide:  No  Will you contract for safety?   NA  Current AVH:  No  Plan for Discharge/Comments:  Patient attended discharge planning group and actively participated in group.  He reports he admitted to have changes made to medications but wants to discharge home today.  He reports being followed by [name].  CSW provided all participants with daily workbook.   Transportation Means: Patient has transportation.   Supports:  Patient has a support system.   [name], [name]

## 2014-01-22 NOTE — Progress Notes (Addendum)
  Chaplain consult at pt request.  Pt encountered chaplain while rounding on 500 hall.  Chaplain spoke with pt in room 508.  Pt spoke with chaplain about his spiritual journey, difficulty in finding a church or spiritual community in [place] where he feels at home.  Spoke with chaplain about his awareness of social justice issues and participation in various campaigns for justice around issues of poverty.  Pt has no family support.  Has brother in [place] with whom he is not close.  Described uncertainty around not feeling grief for this loss of relationship - attributed to feeling of numbness and depression.  Pt was cogent throughout conversation with normal speech.   Regarding medications:  Pt states that he is concerned that he has been taken off seizure medication.  Described that he was placed on medication by neurologist in [place] to manage seizures that arose as side effects of depression medication.  He feels concern that psychiatrist will remove him from this medication and is fearful that he will have seizure.  Stated that he does not wish to take medication that may cause seizures.    [place], [place]

## 2014-01-22 NOTE — Tx Team (Signed)
Interdisciplinary Treatment Plan Update   Date Reviewed:  01/22/2014  Time Reviewed:  8:40 AM  Progress in Treatment:   Attending groups: Yes Participating in groups: Yes Taking medication as prescribed: Yes  Tolerating medication: Yes Family/Significant other contact made:  No, but will ask patient for consent for collateral contact Patient understands diagnosis: Yes  Discussing patient identified problems/goals with staff: Yes Medical problems stabilized or resolved: Yes Denies suicidal/homicidal ideation: Yes Patient has not harmed self or others: Yes  For review of initial/current patient goals, please see plan of care.  Estimated Length of Stay:  3-4 days  Reasons for Continued Hospitalization:  Anxiety Depression Medication stabilization   New Problems/Goals identified:    Discharge Plan or Barriers:   Home with outpatient follow up with [name]  Additional Comments:  58 y/o male who presents voluntarily for complaints of depression. Patient states he has been depressed and staying at home in bed. Patient appears to be fixated on his medications and when asked questions about why he is here patient states he wants to make sure he is not taken off of his xanax because he has to have it. Also patient states several times that he metabolized medications rapidly so he has to take large doses. Patient states he has no family or support system. Patient passive SI but verbally contracts for safety. Patient denies HI and denies AVH.    Attendees:  Patient:  01/22/2014 8:40 AM   Signature: Mylinda Latina, MD 01/22/2014 8:40 AM  Signature:   01/22/2014 8:40 AM  Signature:  Trixie Deis, NP 01/22/2014 8:40 AM  Signature: Briscoe Deutscher, RN 01/22/2014 8:40 AM  Signature:  01/22/2014 8:40 AM  Signature:  Joette Catching, LCSW 01/22/2014 8:40 AM  Signature:  Regan Lemming, LCSW 01/22/2014 8:40 AM  Signature:  Lucinda Dell, Care Coordinator 01/22/2014 8:40 AM  Signature: 01/22/2014 8:40 AM   Signature 01/22/2014  8:40 AM  Signature:   Lars Pinks, RN URCM 01/22/2014  8:40 AM  Signature:  01/22/2014  8:40 AM    Scribe for Treatment Team:   Joette Catching,  01/22/2014 8:40 AM

## 2014-01-22 NOTE — BHH Counselor (Signed)
Adult Comprehensive Assessment  Patient ID: [name], male   DOB: 30-Dec-1955, 58 y.o.   MRN: [id number]  Information Source: Information source: Patient  Current Stressors:  Educational / Learning stressors: None Employment / Job issues: Patient is disabled Family Relationships: None Museum/gallery curator / Lack of resources (include bankruptcy): None Housing / Lack of housing: None Physical health (include injuries & life threatening diseases): HTN and Seizure Disorder Social relationships: None Substance abuse: None Bereavement / Loss: None  Living/Environment/Situation:  Living Arrangements: Alone Living conditions (as described by patient or guardian): Okay How long has patient lived in current situation?: Twelve years What is atmosphere in current home: Comfortable  Family History:  Marital status: Single Does patient have children?: No  Childhood History:  By whom was/is the patient raised?: Both parents Additional childhood history information: Okay childhood Description of patient's relationship with caregiver when they were a child: Distant relationship with parents Patient's description of current relationship with people who raised him/her: Parents are deceased Does patient have siblings?: Yes Number of Siblings: 1 Description of patient's current relationship with siblings: One  brother living out of the country - does not see him Did patient suffer any verbal/emotional/physical/sexual abuse as a child?: No Did patient suffer from severe childhood neglect?: No Has patient ever been sexually abused/assaulted/raped as an adolescent or adult?: No Was the patient ever a victim of a crime or a disaster?: No Witnessed domestic violence?: No Has patient been effected by domestic violence as an adult?: No  Education:  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, water quality Currently a Ship broker?: No Learning disability?: No  Employment/Work Situation:   Employment  situation: On disability Why is patient on disability: Mental Health How long has patient been on disability: 1980's Patient's job has been impacted by current illness: No What is the longest time patient has a held a job?: Patient reorts he has never worked Where was the patient employed at that time?: N/A Has patient ever been in the [employer]?: No Has patient ever served in Recruitment consultant?: No  Financial Resources:   Museum/gallery curator resources: Teacher, early years/pre Does patient have a Programmer, applications or guardian?: No  Alcohol/Substance Abuse:   What has been your use of drugs/alcohol within the last 12 months?: Patient denies If attempted suicide, did drugs/alcohol play a role in this?: No Alcohol/Substance Abuse Treatment Hx: Denies past history Has alcohol/substance abuse ever caused legal problems?: No  Social Support System:   Pensions consultant Support System: Fair Astronomer System: Social Justice Type of faith/religion: Nondualism How does patient's faith help to cope with current illness?: Donot not apply his faith  Leisure/Recreation:   Leisure and Hobbies: Social Justice  Strengths/Needs:   What things does the patient do well?: Motivating others In what areas does patient struggle / problems for patient: Uncertain  Discharge Plan:   Does patient have access to transportation?: Yes Will patient be returning to same living situation after discharge?: Yes Currently receiving community mental health services: Yes (From Whom) ([name]) If no, would patient like referral for services when discharged?: No Does patient have financial barriers related to discharge medications?: No  Summary/Recommendations:  [name] is a 58 year old Caucasian male admitted with Major Depression Disorder.  He will benefit from crisis stabilization, evaluation for medication, psycho-education groups for coping skills development, group therapy and case management for  discharge planning.     [name], [name]. 01/22/2014

## 2014-01-22 NOTE — [facility] Suicide Risk Assessment (Signed)
   Nursing information obtained from:  Patient Demographic factors:  Male;Living alone;Caucasian Current Mental Status:  Suicidal ideation indicated by patient Loss Factors:  NA Historical Factors:  Domestic violence in family of origin;Family history of suicide Risk Reduction Factors:  NA Total Time spent with patient: 1 hour  CLINICAL FACTORS:   Depression:   Anhedonia Hopelessness Impulsivity Insomnia Recent sense of peace/wellbeing Severe Obsessive-Compulsive Disorder Epilepsy Unstable or Poor Therapeutic Relationship Previous Psychiatric Diagnoses and Treatments Medical Diagnoses and Treatments/Surgeries  Psychiatric Specialty Exam: Physical Exam  Constitutional: He is oriented to person, place, and time. He appears well-developed.  HENT:  Head: Normocephalic.  Eyes: Pupils are equal, round, and reactive to light.  Neck: Normal range of motion.  Cardiovascular: Normal rate.   Respiratory: Effort normal.  GI: He exhibits distension.  Musculoskeletal: Normal range of motion.  Neurological: He is alert and oriented to person, place, and time.  Skin: Skin is warm.    Review of Systems  Psychiatric/Behavioral: Positive for depression. The patient is nervous/anxious and has insomnia.   All other systems reviewed and are negative.    Blood pressure 107/76, pulse 108, temperature 97.2 F (36.2 C), temperature source Oral, resp. rate 20, height 5\' 11"  (1.803 m), weight 86.637 kg (191 lb).Body mass index is 26.65 kg/(m^2).  General Appearance: Bizarre, Disheveled and Guarded  Eye Contact::  Fair  Speech:  Blocked and Pressured  Volume:  Normal  Mood:  Anxious, Depressed and Irritable  Affect:  Constricted and Depressed  Thought Process:  Circumstantial and Tangential  Orientation:  Full (Time, Place, and Person)  Thought Content:  Obsessions and Rumination  Suicidal Thoughts:  Yes.  without intent/plan  Homicidal Thoughts:  No  Memory:  Immediate;   Fair  Judgement:   Impaired  Insight:  Fair  Psychomotor Activity:  Restlessness  Concentration:  Fair  Recall:  [employer] of Knowledge:Fair  Language: Fair  Akathisia:  NA  Handed:  Right  AIMS (if indicated):     Assets:  Communication Skills Desire for Improvement Financial Resources/Insurance Housing Leisure Time Physical Health Resilience Transportation  Sleep:  Number of Hours: 3   Musculoskeletal: Strength & Muscle Tone: within normal limits Gait & Station: normal Patient leans: N/A  COGNITIVE FEATURES THAT CONTRIBUTE TO RISK:  Closed-mindedness Loss of executive function Polarized thinking    SUICIDE RISK:   Moderate:  Frequent suicidal ideation with limited intensity, and duration, some specificity in terms of plans, no associated intent, good self-control, limited dysphoria/symptomatology, some risk factors present, and identifiable protective factors, including available and accessible social support.  PLAN OF CARE: Admit for crisis stabilization, safety monitoring and medication management of major depressive disorder recurrent with the obsessive-compulsive disorder.  I certify that inpatient services furnished can reasonably be expected to improve the patient's condition.  Shelvie Salsberry,JANARDHAHA R. 01/22/2014, 12:03 PM

## 2014-01-23 NOTE — Progress Notes (Signed)
D) Pt has slept much of the morning and then after lunch went back to bed to rest some more. States that he is having problems with his memory. Couldn't remember whether or not his therapist was a man or a woman and this really upset him. Feels that he is losing his memory. Was able to go outside and enjoyed the fresh air.  States that he is feeling depressed but did not know how to answer the questions on the Pt inventory. States that he just feels very numb "I didn't even cry when my parents died. I am just totally numb. A) Given support, reassurance and praise. Encouragement given. Praised when able to get up and participate in the afternoon activities. Provided with a 1:1. R) Encourged to go to the groups even though he does not want to and states he has social phobias.

## 2014-01-23 NOTE — Progress Notes (Signed)
[facility] MD Progress Note  01/23/2014 12:41 PM [name]  MRN:  [id number] Subjective:   Upon admission, [name] is a 58 y.o. male who presents voluntarily for complaints of depression with suicidal ideations. Patient states he has been depressed and staying at home in bed. Patient appears to be fixated on his medications and when asked questions about why he is here patient states he wants to make sure he is not taken off of his xanax because he has to have it. Also patient states several times that he metabolized medications rapidly so he has to take large doses. States he has no family or support system. Patient passive SI but verbally contracts for safety. Patient denies HI and denies AVH. Patient medical history: seizures, GERD, depression and anxiety. Patient skin searched and he has old self inflicted cuts to right arm, surgical scar to left shoulder.   During today's assessment, pt rates depression at 10/10 while minimizing anxiety. Pt denies SI, HI, and AVH. Pt does contract for safety. Pt reports that his "Klonopin dosage is not enough, needing Xanax also". Pt reports participating in group therapy sessions as being beneficial. Will continue to monitor.    Diagnosis:   DSM5:  Substance/Addictive Disorders:  Opioid Disorder - Severe (304.00) Depressive Disorders:  Major Depressive Disorder - Severe (296.23) Total Time spent with patient: 25 minutes  Axis I: Major Depression, Recurrent severe and Substance Abuse Axis II: Deferred Axis III:  Past Medical History  Diagnosis Date  . Seizures   . Hypertension   . GERD (gastroesophageal reflux disease)   . Dyslipidemia   . Schizoaffective disorder   . Depression   . Anxiety   . MRSA carrier    Axis IV: other psychosocial or environmental problems and problems related to social environment Axis V: 41-50 serious symptoms  ADL's:  Intact  Sleep: Good  Appetite:  Good  Suicidal Ideation:  Denies Homicidal Ideation:   Denies AEB (as evidenced by):  Psychiatric Specialty Exam: Physical Exam  Review of Systems  Constitutional: Negative.   HENT: Negative.   Eyes: Negative.   Respiratory: Negative.   Cardiovascular: Negative.   Gastrointestinal: Negative.   Genitourinary: Negative.   Musculoskeletal: Negative.   Skin: Negative.   Neurological: Negative.   Endo/Heme/Allergies: Negative.   Psychiatric/Behavioral: Positive for depression (10/10) and substance abuse (opioid). Negative for suicidal ideas and hallucinations. The patient is nervous/anxious (minimizing). The patient does not have insomnia.     Blood pressure 95/66, pulse 125, temperature 97.5 F (36.4 C), temperature source Oral, resp. rate 16, height 5\' 11"  (1.803 m), weight 86.637 kg (191 lb).Body mass index is 26.65 kg/(m^2).  General Appearance: Casual  Eye Contact::  Good  Speech:  Clear and Coherent  Volume:  Normal  Mood:  Anxious  Affect:  Appropriate  Thought Process:  Coherent  Orientation:  Full (Time, Place, and Person)  Thought Content:  WDL  Suicidal Thoughts:  No  Homicidal Thoughts:  No  Memory:  Immediate;   Good Recent;   Good Remote;   Good  Judgement:  Fair  Insight:  Fair  Psychomotor Activity:  Normal  Concentration:  Good  Recall:  Good  Fund of Knowledge:Good  Language: Good  Akathisia:  NA  Handed:  Right  AIMS (if indicated):     Assets:  Communication Skills Desire for Improvement Resilience  Sleep:  Number of Hours: 5.75   Musculoskeletal: Strength & Muscle Tone: within normal limits Gait & Station: normal Patient leans: N/A  Current Medications: Current Facility-Administered Medications  Medication Dose Route Frequency Provider Last Rate Last Dose  . alum & mag hydroxide-simeth (MAALOX/MYLANTA) 200-200-20 MG/5ML suspension 30 mL  30 mL Oral Q4H PRN Clarene Reamer, MD      . amitriptyline (ELAVIL) tablet 450 mg  450 mg Oral QHS Clarene Reamer, MD   450 mg at 01/22/14 2247  .  clonazePAM (KLONOPIN) tablet 2 mg  2 mg Oral BID Waylan Boga, NP   2 mg at 01/23/14 1208  . cloNIDine (CATAPRES) tablet 0.1 mg  0.1 mg Oral QID Malena Peer, NP   0.1 mg at 01/21/14 2157   Followed by  . [START ON 01/24/2014] cloNIDine (CATAPRES) tablet 0.1 mg  0.1 mg Oral BH-qamhs Evanna Glenda Chroman, NP       Followed by  . [START ON 01/27/2014] cloNIDine (CATAPRES) tablet 0.1 mg  0.1 mg Oral QAC breakfast Evanna Glenda Chroman, NP      . dicyclomine (BENTYL) tablet 20 mg  20 mg Oral Q6H PRN Evanna Glenda Chroman, NP      . hydrOXYzine (ATARAX/VISTARIL) tablet 25 mg  25 mg Oral Q6H PRN Evanna Glenda Chroman, NP      . lisinopril (PRINIVIL,ZESTRIL) tablet 5 mg  5 mg Oral Daily Clarene Reamer, MD      . loperamide (IMODIUM) capsule 2-4 mg  2-4 mg Oral PRN Evanna Glenda Chroman, NP      . magnesium hydroxide (MILK OF MAGNESIA) suspension 30 mL  30 mL Oral Daily PRN Clarene Reamer, MD      . methocarbamol (ROBAXIN) tablet 500 mg  500 mg Oral Q8H PRN Evanna Glenda Chroman, NP      . metoprolol succinate (TOPROL-XL) 24 hr tablet 150 mg  150 mg Oral Daily Clarene Reamer, MD   150 mg at 01/23/14 1207  . naproxen (NAPROSYN) tablet 500 mg  500 mg Oral BID PRN Malena Peer, NP      . ondansetron (ZOFRAN-ODT) disintegrating tablet 4 mg  4 mg Oral Q6H PRN Evanna Glenda Chroman, NP      . pantoprazole (PROTONIX) EC tablet 40 mg  40 mg Oral Daily Clarene Reamer, MD   40 mg at 01/23/14 1207  . phenytoin (DILANTIN) chewable tablet 200 mg  200 mg Oral QHS Clarene Reamer, MD   200 mg at 01/22/14 2246  . polyethylene glycol (MIRALAX / GLYCOLAX) packet 17 g  17 g Oral Daily Waylan Boga, NP   17 g at 01/23/14 1211  . QUEtiapine (SEROQUEL) tablet 900 mg  900 mg Oral QHS Clarene Reamer, MD   900 mg at 01/22/14 2248  . simvastatin (ZOCOR) tablet 40 mg  40 mg Oral QHS Clarene Reamer, MD   40 mg at 01/22/14 2250  . [START ON 02/03/2014] testosterone cypionate (DEPOTESTOTERONE CYPIONATE) injection 400 mg  400 mg  Intramuscular Q14 Days Clarene Reamer, MD        Lab Results:  Results for orders placed during the hospital encounter of 01/21/14 (from the past 48 hour(s))  URINALYSIS, ROUTINE W REFLEX MICROSCOPIC     Status: None   Collection Time    01/22/14  7:00 PM      Result Value Range   Color, Urine YELLOW  YELLOW   APPearance CLEAR  CLEAR   Specific Gravity, Urine 1.005  1.005 - 1.030   pH 6.5  5.0 - 8.0   Glucose, UA NEGATIVE  NEGATIVE mg/dL  Hgb urine dipstick NEGATIVE  NEGATIVE   Bilirubin Urine NEGATIVE  NEGATIVE   Ketones, ur NEGATIVE  NEGATIVE mg/dL   Protein, ur NEGATIVE  NEGATIVE mg/dL   Urobilinogen, UA 0.2  0.0 - 1.0 mg/dL   Nitrite NEGATIVE  NEGATIVE   Leukocytes, UA NEGATIVE  NEGATIVE   Comment: MICROSCOPIC NOT DONE ON URINES WITH NEGATIVE PROTEIN, BLOOD, LEUKOCYTES, NITRITE, OR GLUCOSE <1000 mg/dL.     Performed at [facility]    Physical Findings: AIMS:  , ,  ,  ,    CIWA:  CIWA-Ar Total: 2 COWS:     Treatment Plan Summary: Daily contact with patient to assess and evaluate symptoms and progress in treatment Medication management  Plan: Review of chart, vital signs, medications, and notes.  1-Individual and group therapy  2-Medication management for depression and anxiety: Medications reviewed with the patient and he stated no untoward effects, unchanged.  3-Coping skills for depression, anxiety  4-Continue crisis stabilization and management  5-Address health issues--monitoring vital signs, stable  6-Treatment plan in progress to prevent relapse of depression and anxiety  Medical Decision Making Problem Points:  Established problem, stable/improving (1), Review of last therapy session (1) and Review of psycho-social stressors (1) Data Points:  Review or order clinical lab tests (1) Review or order medicine tests (1) Review of medication regiment & side effects (2) Review of new medications or change in dosage (2)  I certify that  inpatient services furnished can reasonably be expected to improve the patient's condition.   Benjamine Mola, FNP-BC 01/23/2014, 12:41 PM  I agreed with the findings, treatment and disposition plan of this patient. Berniece Andreas, MD

## 2014-01-23 NOTE — Progress Notes (Signed)
.  Psychoeducational Group Note    Date: 01/23/2014 Time:  0930    Goal Setting Purpose of Group: To be able to set a goal that is measurable and that can be accomplished in one day Participation Level: Did not attend   [name]

## 2014-01-23 NOTE — [facility] Group Notes (Signed)
[place] Group Notes: (Clinical Social Work)   01/23/2014      Type of Therapy:  Group Therapy   Participation Level:  Did Not Attend    Selmer Dominion, LCSW 01/23/2014, 4:08 PM

## 2014-01-23 NOTE — Progress Notes (Signed)
Adult Psychoeducational Group Note  Date:  01/23/2014 Time:  5:26 PM  Group Topic/Focus:  Healthy Communication:   The focus of this group is to discuss communication, barriers to communication, as well as healthy ways to communicate with others.  Participation Level:  Did Not Attend  Elisha Headland 01/23/2014, 5:26 PM

## 2014-01-23 NOTE — Progress Notes (Signed)
Psychoeducational Group Note  Date: 01/23/2014 Time:  1015  Group Topic/Focus:  Identifying Needs:   The focus of this group is to help patients identify their personal needs that have been historically problematic and identify healthy behaviors to address their needs.  Participation Level:  Did not attend Additional Comments:    Paulino Rily

## 2014-01-23 NOTE — ED Provider Notes (Signed)
Medical screening examination/treatment/procedure(s) were performed by non-physician practitioner and as supervising physician I was immediately available for consultation/collaboration.  EKG Interpretation   None         Merryl Hacker, MD 01/23/14 [phone]

## 2014-01-23 NOTE — Progress Notes (Signed)
Adult Psychoeducational Group Note  Date:  01/23/2014 Time:  10:53 PM  Group Topic/Focus:  Wrap-Up Group:   The focus of this group is to help patients review their daily goal of treatment and discuss progress on daily workbooks.  Participation Level:  Active  Participation Quality:  Appropriate  Affect:  Appropriate  Cognitive:  Appropriate  Insight: Appropriate  Engagement in Group:  Engaged  Modes of Intervention:  Discussion  Additional Comments: The patient expressed that he had a personal conversation with minister.  Nash Shearer 01/23/2014, 10:53 PM

## 2014-01-24 DIAGNOSIS — F1110 Opioid abuse, uncomplicated: Secondary | ICD-10-CM

## 2014-01-24 MED ORDER — OMEGA-3-ACID ETHYL ESTERS 1 G PO CAPS
1.0000 g | ORAL_CAPSULE | Freq: Two times a day (BID) | ORAL | Status: DC
  Administered 2014-01-24 – 2014-01-27 (×6): 1 g via ORAL
  Filled 2014-01-24: qty 6
  Filled 2014-01-24 (×10): qty 1
  Filled 2014-01-24: qty 6
  Filled 2014-01-24: qty 1

## 2014-01-24 MED ORDER — TAMSULOSIN HCL 0.4 MG PO CAPS
0.4000 mg | ORAL_CAPSULE | Freq: Every day | ORAL | Status: DC
  Administered 2014-01-24 – 2014-01-27 (×4): 0.4 mg via ORAL
  Filled 2014-01-24 (×6): qty 1

## 2014-01-24 NOTE — Progress Notes (Signed)
Psychoeducational Group Note  Date: 01/24/2014  Time: 0930  Group Topic/Focus:  Gratefulness: The focus of this group is to help patients identify what two things they are most grateful for in their lives. What helps ground them and to center them on their work to their recovery.  Participation Level: Did not attend Paulino Rily

## 2014-01-24 NOTE — [facility] Group Notes (Signed)
[place] Group Notes: (Clinical Social Work)   01/24/2014      Type of Therapy:  Group Therapy   Participation Level:  Did Not Attend - came in for last few minutes of group.   Selmer Dominion, LCSW 01/24/2014, 3:55 PM

## 2014-01-24 NOTE — Progress Notes (Signed)
Adult Psychoeducational Group Note  Date:  01/24/2014 Time:  3:15PM  Group Topic/Focus:  Healthy Support Systems  Participation Level:  Minimal  Additional Comments:  Pt came in at the last 10 minutes of the group session.   Zoila Shutter R 01/24/2014, 7:31 PM

## 2014-01-24 NOTE — Progress Notes (Signed)
Adult Psychoeducational Group Note  Date:  01/24/2014 Time:  10:04 PM  Group Topic/Focus:  Wrap-Up Group:   The focus of this group is to help patients review their daily goal of treatment and discuss progress on daily workbooks.  Participation Level:  Active  Participation Quality:  Appropriate  Affect:  Appropriate  Cognitive:  Appropriate  Insight: Appropriate  Engagement in Group:  Engaged  Modes of Intervention:  Discussion  Additional Comments:The patient expressed in group that he did not isolate today.The patient said that he had a flat affect today.  Nash Shearer 01/24/2014, 10:04 PM

## 2014-01-24 NOTE — Progress Notes (Signed)
Writer observed patient up in the dayroom interacting appropriately with peers. He attended group this evening. Writer spoke with him 1:1 and he reports that he feels numb all the time and stays in bed all day. Patient reports that his blood pressure has been low so he doesn't want to take the clonidine because it will be even lower in the morning. Writer encouraged him to speak with his doctor concerning this and Probation officer encouraged fluids and provided him with a pitcher of water to keep at his bedside. Patient currently denies si/hi/a/v hallucinations. Safety maintained on unit with 15 min checks.

## 2014-01-24 NOTE — Progress Notes (Signed)
Psychoeducational Group Note  Date:  01/24/2014 Time:  1015  Group Topic/Focus:  Making Healthy Choices:   The focus of this group is to help patients identify negative/unhealthy choices they were using prior to admission and identify positive/healthier coping strategies to replace them upon discharge.  Participation Level:  Active  Participation Quality:  Appropriate  Affect:  Appropriate  Cognitive:  Oriented  Insight:  Engaged  Engagement in Group:  Engaged  Additional Comments:  Pt was engaged in group  [place], [place] A 01/24/2014

## 2014-01-24 NOTE — Progress Notes (Signed)
Patient ID: [name], male   DOB: 1956-01-06, 58 y.o.   MRN: [id number] [facility] MD Progress Note  01/24/2014 3:49 PM [name]  MRN:  [id number] Subjective:   Upon admission, [name] is a 58 y.o. male who presents voluntarily for complaints of depression with suicidal ideations. Patient states he has been depressed and staying at home in bed. Patient appears to be fixated on his medications and when asked questions about why he is here patient states he wants to make sure he is not taken off of his xanax because he has to have it. Also patient states several times that he metabolized medications rapidly so he has to take large doses. States he has no family or support system. Patient passive SI but verbally contracts for safety. Patient denies HI and denies AVH. Patient medical history: seizures, GERD, depression and anxiety. Patient skin searched and he has old self inflicted cuts to right arm, surgical scar to left shoulder.   Pt reported depression 10/10 yesterday and is now minimizing, stating that he has a flat affect instead. Pt rates anxiety at 2/10. Denies SI, HI, and AVH, contracts for safety. Pt expresses concern over memory issues that have been going on for some time. Pt advised to seek neurology consult upon discharge; affirmed agreement with said plan. Pt reports great improvement overall and participation in group therapy. Pt is in agreement with medication and treatment plan.   Diagnosis:   DSM5:  Substance/Addictive Disorders:  Opioid Disorder - Severe (304.00) Depressive Disorders:  Major Depressive Disorder - Severe (296.23) Total Time spent with patient: 25 minutes  Axis I: Major Depression, Recurrent severe and Substance Abuse Axis II: Deferred Axis III:  Past Medical History  Diagnosis Date  . Seizures   . Hypertension   . GERD (gastroesophageal reflux disease)   . Dyslipidemia   . Schizoaffective disorder   . Depression   . Anxiety   . MRSA carrier     Axis IV: other psychosocial or environmental problems and problems related to social environment Axis V: 41-50 serious symptoms  ADL's:  Intact  Sleep: Good  Appetite:  Good  Suicidal Ideation:  Denies Homicidal Ideation:  Denies AEB (as evidenced by):  Psychiatric Specialty Exam: Physical Exam  Review of Systems  Constitutional: Negative.   HENT: Negative.   Eyes: Negative.   Respiratory: Negative.   Cardiovascular: Negative.   Gastrointestinal: Negative.   Genitourinary: Negative.   Musculoskeletal: Negative.   Skin: Negative.   Neurological: Negative.   Endo/Heme/Allergies: Negative.   Psychiatric/Behavioral: Positive for depression (10/10) and substance abuse (opioid). Negative for suicidal ideas and hallucinations. The patient is nervous/anxious (minimizing). The patient does not have insomnia.     Blood pressure 100/66, pulse 122, temperature 97.9 F (36.6 C), temperature source Oral, resp. rate 16, height 5\' 11"  (1.803 m), weight 86.637 kg (191 lb).Body mass index is 26.65 kg/(m^2).  General Appearance: Casual  Eye Contact::  Good  Speech:  Clear and Coherent  Volume:  Normal  Mood:  Anxious  Affect:  Appropriate  Thought Process:  Coherent  Orientation:  Full (Time, Place, and Person)  Thought Content:  WDL  Suicidal Thoughts:  No  Homicidal Thoughts:  No  Memory:  Immediate;   Good Recent;   Good Remote;   Good  Judgement:  Fair  Insight:  Fair  Psychomotor Activity:  Normal  Concentration:  Good  Recall:  Good  Fund of Knowledge:Good  Language: Good  Akathisia:  NA  Handed:  Right  AIMS (if indicated):     Assets:  Communication Skills Desire for Improvement Resilience  Sleep:  Number of Hours: 4.5   Musculoskeletal: Strength & Muscle Tone: within normal limits Gait & Station: normal Patient leans: N/A  Current Medications: Current Facility-Administered Medications  Medication Dose Route Frequency Provider Last Rate Last Dose  . alum  & mag hydroxide-simeth (MAALOX/MYLANTA) 200-200-20 MG/5ML suspension 30 mL  30 mL Oral Q4H PRN Clarene Reamer, MD      . amitriptyline (ELAVIL) tablet 450 mg  450 mg Oral QHS Clarene Reamer, MD   450 mg at 01/23/14 2140  . clonazePAM (KLONOPIN) tablet 2 mg  2 mg Oral BID Waylan Boga, NP   2 mg at 01/24/14 1332  . cloNIDine (CATAPRES) tablet 0.1 mg  0.1 mg Oral BH-qamhs Evanna Glenda Chroman, NP       Followed by  . [START ON 01/27/2014] cloNIDine (CATAPRES) tablet 0.1 mg  0.1 mg Oral QAC breakfast Evanna Glenda Chroman, NP      . dicyclomine (BENTYL) tablet 20 mg  20 mg Oral Q6H PRN Evanna Glenda Chroman, NP      . hydrOXYzine (ATARAX/VISTARIL) tablet 25 mg  25 mg Oral Q6H PRN Evanna Glenda Chroman, NP      . lisinopril (PRINIVIL,ZESTRIL) tablet 5 mg  5 mg Oral Daily Clarene Reamer, MD   5 mg at 01/24/14 1327  . loperamide (IMODIUM) capsule 2-4 mg  2-4 mg Oral PRN Evanna Glenda Chroman, NP      . magnesium hydroxide (MILK OF MAGNESIA) suspension 30 mL  30 mL Oral Daily PRN Clarene Reamer, MD      . methocarbamol (ROBAXIN) tablet 500 mg  500 mg Oral Q8H PRN Evanna Glenda Chroman, NP      . metoprolol succinate (TOPROL-XL) 24 hr tablet 150 mg  150 mg Oral Daily Clarene Reamer, MD   150 mg at 01/24/14 1327  . naproxen (NAPROSYN) tablet 500 mg  500 mg Oral BID PRN Malena Peer, NP      . omega-3 acid ethyl esters (LOVAZA) capsule 1 g  1 g Oral BID Benjamine Mola, FNP      . ondansetron (ZOFRAN-ODT) disintegrating tablet 4 mg  4 mg Oral Q6H PRN Evanna Glenda Chroman, NP      . pantoprazole (PROTONIX) EC tablet 40 mg  40 mg Oral Daily Clarene Reamer, MD   40 mg at 01/24/14 1327  . phenytoin (DILANTIN) chewable tablet 200 mg  200 mg Oral QHS Clarene Reamer, MD   200 mg at 01/23/14 2139  . polyethylene glycol (MIRALAX / GLYCOLAX) packet 17 g  17 g Oral Daily Waylan Boga, NP   17 g at 01/24/14 1326  . QUEtiapine (SEROQUEL) tablet 900 mg  900 mg Oral QHS Clarene Reamer, MD   900 mg at 01/23/14 2140  .  simvastatin (ZOCOR) tablet 40 mg  40 mg Oral QHS Clarene Reamer, MD   40 mg at 01/23/14 2140  . tamsulosin (FLOMAX) capsule 0.4 mg  0.4 mg Oral Daily Benjamine Mola, FNP      . [START ON 02/03/2014] testosterone cypionate (DEPOTESTOTERONE CYPIONATE) injection 400 mg  400 mg Intramuscular Q14 Days Clarene Reamer, MD        Lab Results:  Results for orders placed during the hospital encounter of 01/21/14 (from the past 48 hour(s))  URINALYSIS, ROUTINE W REFLEX MICROSCOPIC     Status: None  Collection Time    01/22/14  7:00 PM      Result Value Range   Color, Urine YELLOW  YELLOW   APPearance CLEAR  CLEAR   Specific Gravity, Urine 1.005  1.005 - 1.030   pH 6.5  5.0 - 8.0   Glucose, UA NEGATIVE  NEGATIVE mg/dL   Hgb urine dipstick NEGATIVE  NEGATIVE   Bilirubin Urine NEGATIVE  NEGATIVE   Ketones, ur NEGATIVE  NEGATIVE mg/dL   Protein, ur NEGATIVE  NEGATIVE mg/dL   Urobilinogen, UA 0.2  0.0 - 1.0 mg/dL   Nitrite NEGATIVE  NEGATIVE   Leukocytes, UA NEGATIVE  NEGATIVE   Comment: MICROSCOPIC NOT DONE ON URINES WITH NEGATIVE PROTEIN, BLOOD, LEUKOCYTES, NITRITE, OR GLUCOSE <1000 mg/dL.     Performed at [facility]    Physical Findings: AIMS: Facial and Oral Movements Muscles of Facial Expression: None, normal Lips and Perioral Area: None, normal Jaw: None, normal Tongue: None, normal,Extremity Movements Upper (arms, wrists, hands, fingers): None, normal Lower (legs, knees, ankles, toes): None, normal, Trunk Movements Neck, shoulders, hips: None, normal, Overall Severity Severity of abnormal movements (highest score from questions above): None, normal Incapacitation due to abnormal movements: None, normal Patient's awareness of abnormal movements (rate only patient's report): No Awareness, Dental Status Current problems with teeth and/or dentures?: No Does patient usually wear dentures?: No  CIWA:  CIWA-Ar Total: 2 COWS:  COWS Total Score: 1  Treatment Plan  Summary: Daily contact with patient to assess and evaluate symptoms and progress in treatment Medication management  Plan: Review of chart, vital signs, medications, and notes.  1-Individual and group therapy  2-Medication management for depression and anxiety: Medications reviewed with the patient and he stated no untoward effects. -Add Lovaza 1g PO bid for hypertriglyceridemia -Add Flomax 0.4mg  PO daily for urinary urgency/frequency/retention 3-Coping skills for depression, anxiety  4-Continue crisis stabilization and management  5-Address health issues--monitoring vital signs, stable  6-Treatment plan in progress to prevent relapse of depression and anxiety  Medical Decision Making Problem Points:  Established problem, stable/improving (1), Review of last therapy session (1) and Review of psycho-social stressors (1) Data Points:  Review or order clinical lab tests (1) Review or order medicine tests (1) Review of medication regiment & side effects (2) Review of new medications or change in dosage (2)  I certify that inpatient services furnished can reasonably be expected to improve the patient's condition.   Benjamine Mola, FNP-BC 01/24/2014, 3:49 PM I agreed with the findings, treatment and disposition plan of this patient. Berniece Andreas, MD

## 2014-01-24 NOTE — Progress Notes (Signed)
D) Pt. Was up a bit more today in the milieu. Affect, for the most part, is flat. Pt did engage with this Probation officer in a long conversation about his cat. Pt states his cat is home alone and is concerned that the cat will not have enough food to last him. Unsure that the door to the room with the food is open. The cat has access to water and Pt believes he will be discharged the early part of this week to go back and care for his cat. Affect was a bit brighter when talking about his cat. Pt also reports that he has not had a bath or a shower in 8 years. States that he will wash off every now and then. A) Given support, reassurance and praise. Encouraged to take a shower and clean himself Provided with several 1:1's throughout the shift.  R) Pt was able to sit on the toilet and bathe himself.

## 2014-01-25 MED ORDER — HYDROXYZINE HCL 50 MG PO TABS
50.0000 mg | ORAL_TABLET | Freq: Every day | ORAL | Status: DC
  Administered 2014-01-25 – 2014-01-26 (×2): 50 mg via ORAL
  Filled 2014-01-25 (×5): qty 1

## 2014-01-25 MED ORDER — QUETIAPINE FUMARATE 400 MG PO TABS
800.0000 mg | ORAL_TABLET | Freq: Every day | ORAL | Status: DC
  Administered 2014-01-25 – 2014-01-26 (×2): 800 mg via ORAL
  Filled 2014-01-25: qty 2
  Filled 2014-01-25: qty 4
  Filled 2014-01-25 (×3): qty 2
  Filled 2014-01-25: qty 6

## 2014-01-25 NOTE — [facility] Group Notes (Signed)
   [facility] LCSW Group Therapy                 Overcoming Obstacles   1:15 -2:30        01/25/2014     Type of Therapy:  Group Therapy  Participation Level:  Appropriate  Participation Quality:  Appropriate  Affect:  Appropriate, Alert  Cognitive:  Attentive Appropriate  Insight: Developing/Improving Engaged  Engagement in Therapy: Developing/Imprvoing Engaged  Modes of Intervention:  Discussion Exploration  Education Rapport BuildingProblem-Solving Support  Summary of Progress/Problems:  The main focus of today's group was overcoming obstacles.  He advised the obstacle he has to overcome is depression. Patient shared he will stay in bed for days at a time.  He shared he will often use a glass sitting by the night stand to urinate rather than go to the bathroom.  He advised he will forget the glass has urine in it and will start to drink urine to take his medications.  He was advised of the transition care team through [facility] and also advised of [place] as a means of building a support group.  Patient able to identify appropriate coping skills.   Concha Pyo 01/25/2014

## 2014-01-25 NOTE — Clinical Social Work Note (Signed)
Writer spoke with Lucinda Dell, [facility] Transition Care Team, to refer patient for Transition Care Services.

## 2014-01-25 NOTE — Progress Notes (Signed)
Patient ID: [name], male   DOB: [birth year], 58 y.o.   MRN: [id number] [facility] MD Progress Note  01/25/2014 3:06 PM Scot Shiraishi  MRN:  [id number] Subjective:   Upon admission, [name] is a 58 y.o. male who presents voluntarily for complaints of depression with suicidal ideations. Patient states he has been depressed and staying at home in bed. Patient appears to be fixated on his medications and when asked questions about why he is here patient states he wants to make sure he is not taken off of his xanax because he has to have it. Also patient states several times that he metabolized medications rapidly so he has to take large doses. States he has no family or support system. Patient passive SI but verbally contracts for safety. Patient denies HI and denies AVH. Patient medical history: seizures, GERD, depression and anxiety. Patient skin searched and he has old self inflicted cuts to right arm, surgical scar to left shoulder.   Pt is minimizing depression/anxiety, stating that he "feels mostly flat, but much better today overall". Pt complains that he had trouble falling asleep and then was groggy upon waking this morning. Nurse reports that pt was difficult to wake as well. Pt denies SI, HI, and AVH, contracts for safety. Pt reports great improvement overall and participation in group therapy. Pt is in agreement with medication and treatment plan.   Diagnosis:   DSM5:  Substance/Addictive Disorders:  Opioid Disorder - Severe (304.00) Depressive Disorders:  Major Depressive Disorder - Severe (296.23) Total Time spent with patient: 25 minutes  Axis I: Major Depression, Recurrent severe and Substance Abuse Axis II: Deferred Axis III:  Past Medical History  Diagnosis Date  . Seizures   . Hypertension   . GERD (gastroesophageal reflux disease)   . Dyslipidemia   . Schizoaffective disorder   . Depression   . Anxiety   . MRSA carrier    Axis IV: other psychosocial or  environmental problems and problems related to social environment Axis V: 41-50 serious symptoms  ADL's:  Intact  Sleep: Good  Appetite:  Good  Suicidal Ideation:  Denies Homicidal Ideation:  Denies AEB (as evidenced by):  Psychiatric Specialty Exam: Physical Exam  Review of Systems  Constitutional: Negative.   HENT: Negative.   Eyes: Negative.   Respiratory: Negative.   Cardiovascular: Negative.   Gastrointestinal: Negative.   Genitourinary: Negative.   Musculoskeletal: Negative.   Skin: Negative.   Neurological: Negative.   Endo/Heme/Allergies: Negative.   Psychiatric/Behavioral: Positive for depression (10/10) and substance abuse (opioid). Negative for suicidal ideas and hallucinations. The patient is nervous/anxious (minimizing). The patient does not have insomnia.     Blood pressure 105/69, pulse 74, temperature 98.2 F (36.8 C), temperature source Oral, resp. rate 16, height 5\' 11"  (1.803 m), weight 86.637 kg (191 lb).Body mass index is 26.65 kg/(m^2).  General Appearance: Casual  Eye Contact::  Good  Speech:  Clear and Coherent  Volume:  Normal  Mood:  Anxious  Affect:  Appropriate  Thought Process:  Coherent  Orientation:  Full (Time, Place, and Person)  Thought Content:  WDL  Suicidal Thoughts:  No  Homicidal Thoughts:  No  Memory:  Immediate;   Good Recent;   Good Remote;   Good  Judgement:  Fair  Insight:  Fair  Psychomotor Activity:  Normal  Concentration:  Good  Recall:  Good  Fund of Knowledge:Good  Language: Good  Akathisia:  NA  Handed:  Right  AIMS (if indicated):  Assets:  Communication Skills Desire for Improvement Resilience  Sleep:  Number of Hours: 3   Musculoskeletal: Strength & Muscle Tone: within normal limits Gait & Station: normal Patient leans: N/A  Current Medications: Current Facility-Administered Medications  Medication Dose Route Frequency Provider Last Rate Last Dose  . alum & mag hydroxide-simeth  (MAALOX/MYLANTA) 200-200-20 MG/5ML suspension 30 mL  30 mL Oral Q4H PRN Clarene Reamer, MD      . amitriptyline (ELAVIL) tablet 450 mg  450 mg Oral QHS Clarene Reamer, MD   450 mg at 01/24/14 2236  . clonazePAM (KLONOPIN) tablet 2 mg  2 mg Oral BID Waylan Boga, NP   2 mg at 01/24/14 2236  . cloNIDine (CATAPRES) tablet 0.1 mg  0.1 mg Oral BH-qamhs Evanna Cori Greig Castilla, NP   0.1 mg at 01/24/14 2236   Followed by  . [START ON 01/27/2014] cloNIDine (CATAPRES) tablet 0.1 mg  0.1 mg Oral QAC breakfast Evanna Glenda Chroman, NP      . dicyclomine (BENTYL) tablet 20 mg  20 mg Oral Q6H PRN Evanna Glenda Chroman, NP      . hydrOXYzine (ATARAX/VISTARIL) tablet 25 mg  25 mg Oral Q6H PRN Malena Peer, NP   25 mg at 01/25/14 0224  . lisinopril (PRINIVIL,ZESTRIL) tablet 5 mg  5 mg Oral Daily Clarene Reamer, MD   5 mg at 01/25/14 1038  . loperamide (IMODIUM) capsule 2-4 mg  2-4 mg Oral PRN Evanna Glenda Chroman, NP      . magnesium hydroxide (MILK OF MAGNESIA) suspension 30 mL  30 mL Oral Daily PRN Clarene Reamer, MD      . methocarbamol (ROBAXIN) tablet 500 mg  500 mg Oral Q8H PRN Evanna Glenda Chroman, NP      . metoprolol succinate (TOPROL-XL) 24 hr tablet 150 mg  150 mg Oral Daily Clarene Reamer, MD   150 mg at 01/25/14 1038  . naproxen (NAPROSYN) tablet 500 mg  500 mg Oral BID PRN Malena Peer, NP      . omega-3 acid ethyl esters (LOVAZA) capsule 1 g  1 g Oral BID Benjamine Mola, FNP   1 g at 01/25/14 1038  . ondansetron (ZOFRAN-ODT) disintegrating tablet 4 mg  4 mg Oral Q6H PRN Evanna Glenda Chroman, NP      . pantoprazole (PROTONIX) EC tablet 40 mg  40 mg Oral Daily Clarene Reamer, MD   40 mg at 01/25/14 1039  . phenytoin (DILANTIN) chewable tablet 200 mg  200 mg Oral QHS Clarene Reamer, MD   200 mg at 01/24/14 2236  . polyethylene glycol (MIRALAX / GLYCOLAX) packet 17 g  17 g Oral Daily Waylan Boga, NP   17 g at 01/25/14 1039  . QUEtiapine (SEROQUEL) tablet 900 mg  900 mg Oral QHS Clarene Reamer, MD   900 mg at 01/24/14 2236  . simvastatin (ZOCOR) tablet 40 mg  40 mg Oral QHS Clarene Reamer, MD   40 mg at 01/24/14 2236  . tamsulosin (FLOMAX) capsule 0.4 mg  0.4 mg Oral Daily Benjamine Mola, FNP   0.4 mg at 01/25/14 1040  . [START ON 02/03/2014] testosterone cypionate (DEPOTESTOTERONE CYPIONATE) injection 400 mg  400 mg Intramuscular Q14 Days Clarene Reamer, MD        Lab Results:  No results found for this or any previous visit (from the past 48 hour(s)).  Physical Findings: AIMS: Facial and Oral Movements Muscles of Facial Expression:  None, normal Lips and Perioral Area: None, normal Jaw: None, normal Tongue: None, normal,Extremity Movements Upper (arms, wrists, hands, fingers): None, normal Lower (legs, knees, ankles, toes): None, normal, Trunk Movements Neck, shoulders, hips: None, normal, Overall Severity Severity of abnormal movements (highest score from questions above): None, normal Incapacitation due to abnormal movements: None, normal Patient's awareness of abnormal movements (rate only patient's report): No Awareness, Dental Status Current problems with teeth and/or dentures?: No Does patient usually wear dentures?: No  CIWA:  CIWA-Ar Total: 2 COWS:  COWS Total Score: 1  Treatment Plan Summary: Daily contact with patient to assess and evaluate symptoms and progress in treatment Medication management  Plan: Review of chart, vital signs, medications, and notes.  1-Individual and group therapy  2-Medication management for depression and anxiety: Medications reviewed with the patient and he stated no untoward effects. -ContinueLovaza 1g PO bid for hypertriglyceridemia -Continue Flomax 0.4mg  PO daily for urinary urgency/frequency/retention -Add Vistaril 50mg  qhs for insomnia -Decrease Seroquel from 900mg  to 800mg  qhs for lethargy and difficulty waking. 3-Coping skills for depression, anxiety  4-Continue crisis stabilization and management  5-Address  health issues--monitoring vital signs, stable  6-Treatment plan in progress to prevent relapse of depression and anxiety  Medical Decision Making Problem Points:  Established problem, stable/improving (1), Review of last therapy session (1) and Review of psycho-social stressors (1) Data Points:  Review or order clinical lab tests (1) Review or order medicine tests (1) Review of medication regiment & side effects (2) Review of new medications or change in dosage (2)  I certify that inpatient services furnished can reasonably be expected to improve the patient's condition.   Benjamine Mola, FNP-BC 01/25/2014, 3:06 PM Agree with assessment and plan Geralyn Flash A. Sabra Heck, M.D.

## 2014-01-25 NOTE — Progress Notes (Signed)
Writer has observed patient up in the dayroom watching tv and interacting with peers appropriately. He was compliant with his scheduled medications and his main concersn are his memory loss occasionally and how many days medicaid will pay for hospitalization. He currently denies si/hi/a/v hallucinations. He plans to see a neurologist once discharged concerning his memory pxs. Writer encouraged patient to follow through with his plans. Safety maintained on unit with 15 min checks.

## 2014-01-25 NOTE — Progress Notes (Signed)
Recreation Therapy Notes  Date: 02.09.2015 Time: 2:45pm Location: [address] Dayroom   Group Topic: Wellness  Goal Area(s) Addresses:  Patient will identify dimension of wellness they most struggle with.  Patient will identify at least 3 ways to invest in that type of wellness.  Patient will identify benefit of identifying areas of improvement.   Behavioral Response: Did not attend.   Laureen Ochs Lanyla Costello, LRT/CTRS  Lane Hacker 01/25/2014 5:19 PM

## 2014-01-25 NOTE — Progress Notes (Addendum)
Patient would not lift his head off pillow.  Slurred his words.  Nurse could understand only part of his id information, slurred the word hospital.  Nurse discussed with MD.  No morning meds given at this time per MD instructions.  Patient continues to lay in bed, has not lifted head off pillow.   1010  NP informed of patient's status.  Patient presently opening eyes, can tell nurse date, month, place, moves legs/feet, raised arms/hands.  Patient continues to lay in bed with eyes closed.   1040  NP stated to give patient his morning meds.  MD stated not to give klonipin and clonidine this morning.  Patient sat up in bed, took his medications without difficulty.  Laid back on pillow.    D:  Patient's self inventory sheet, patient sleeps well, improving appetite, low energy level, poor attention span.  Rated depression, hopeless and anxiety #10.  Denied withdrawals.  Denied pain.  Denied SI.  Plans to return home after discharge.  No problems with meds after discharge. A:  Medications administered per NP orders.  Klonipin and clonidine held.  Emotional support and encouargement given patient. R:  Denied SI and HI.  Contracts for safety.  Denied A/V hallucinations.  Will continue to monitor patient for safety with 15 minute checks.  Safety maintained.

## 2014-01-25 NOTE — Progress Notes (Signed)
Adult Psychoeducational Group Note  Date:  01/25/2014 Time:  10:42 PM  Group Topic/Focus:  Goals Group:   The focus of this group is to help patients establish daily goals to achieve during treatment and discuss how the patient can incorporate goal setting into their daily lives to aide in recovery.  Participation Level:  Active  Participation Quality:  Appropriate  Affect:  Appropriate  Cognitive:  Appropriate  Insight: Appropriate  Engagement in Group:  Engaged  Modes of Intervention:  Discussion  Additional Comments:  Pt stated that is days was ok, he is waiting to see how the change of his meds are going to effect him.  Alexis Goodell R 01/25/2014, 10:42 PM

## 2014-01-25 NOTE — [facility] Group Notes (Signed)
[facility] LCSW Aftercare Discharge Planning Group Note   01/25/2014 10:10 AM  Participation Quality:  Patient did not attend groups.l  [name], [name]

## 2014-01-26 MED ORDER — CLONAZEPAM 1 MG PO TABS
1.0000 mg | ORAL_TABLET | Freq: Two times a day (BID) | ORAL | Status: DC
  Administered 2014-01-26 – 2014-01-27 (×2): 1 mg via ORAL
  Filled 2014-01-26 (×2): qty 1
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
## 2014-01-26 NOTE — Progress Notes (Signed)
The focus of this group is to educate the patient on the purpose and policies of crisis stabilization and provide a format to answer questions about their admission.  The group details unit policies and expectations of patients while admitted.  Patient did not attend 0900 nurse group orientation this morning.  Patient stayed in bed sleeping.

## 2014-01-26 NOTE — Progress Notes (Signed)
Patient ID: [name], male   DOB: [birth year], 58 y.o.   MRN: [id number] Patient ID: [name], male   DOB: [birth year], 59 y.o.   MRN: [id number] [facility] MD Progress Note  01/26/2014 1:38 PM Brittian Borchert  MRN:  [id number] Subjective:   [name] is a 58 y.o. male who presents voluntarily for complaints of depression with suicidal ideations. Patient states he has been depressed and staying at home in bed. Patient appears to be fixated on his medications and when asked questions about why he is here patient states he wants to make sure he is not taken off of his xanax because he has to have it. Also patient states several times that he metabolized medications rapidly so he has to take large doses.   Patient was appeared lying down in his bed and reporting feeling depression 8/10 and continue to have anxiety. Patient has complaints about memory deficits and not able to remember his days he spent. Patient has no symptoms of opioid withdrawal and has a slow improvement in his depression and anxiety. Patient had trouble falling asleep and then was groggy upon waking this morning. He denies SI, HI, and AVH, contracts for safety. Pt reports great improvement overall and participation in group therapy.   Diagnosis:   DSM5:  Substance/Addictive Disorders:  Opioid Disorder - Severe (304.00) Depressive Disorders:  Major Depressive Disorder - Severe (296.23) Total Time spent with patient: 25 minutes  Axis I: Major Depression, Recurrent severe and Substance Abuse Axis II: Deferred Axis III:  Past Medical History  Diagnosis Date  . Seizures   . Hypertension   . GERD (gastroesophageal reflux disease)   . Dyslipidemia   . Schizoaffective disorder   . Depression   . Anxiety   . MRSA carrier    Axis IV: other psychosocial or environmental problems and problems related to social environment Axis V: 41-50 serious symptoms  ADL's:  Intact  Sleep: Good  Appetite:  Good  Suicidal  Ideation:  Denies Homicidal Ideation:  Denies AEB (as evidenced by):  Psychiatric Specialty Exam: Physical Exam  Review of Systems  Constitutional: Negative.   HENT: Negative.   Eyes: Negative.   Respiratory: Negative.   Cardiovascular: Negative.   Gastrointestinal: Negative.   Genitourinary: Negative.   Musculoskeletal: Negative.   Skin: Negative.   Neurological: Negative.   Endo/Heme/Allergies: Negative.   Psychiatric/Behavioral: Positive for depression (10/10) and substance abuse (opioid). Negative for suicidal ideas and hallucinations. The patient is nervous/anxious (minimizing). The patient does not have insomnia.     Blood pressure 98/65, pulse 97, temperature 97.5 F (36.4 C), temperature source Oral, resp. rate 18, height 5\' 11"  (1.803 m), weight 86.637 kg (191 lb).Body mass index is 26.65 kg/(m^2).  General Appearance: Casual  Eye Contact::  Good  Speech:  Clear and Coherent  Volume:  Normal  Mood:  Anxious  Affect:  Appropriate  Thought Process:  Coherent  Orientation:  Full (Time, Place, and Person)  Thought Content:  WDL  Suicidal Thoughts:  No  Homicidal Thoughts:  No  Memory:  Immediate;   Good Recent;   Good Remote;   Good  Judgement:  Fair  Insight:  Fair  Psychomotor Activity:  Normal  Concentration:  Good  Recall:  Good  Fund of Knowledge:Good  Language: Good  Akathisia:  NA  Handed:  Right  AIMS (if indicated):     Assets:  Communication Skills Desire for Improvement Resilience  Sleep:  Number of Hours: 3   Musculoskeletal: Strength &  Muscle Tone: within normal limits Gait & Station: normal Patient leans: N/A  Current Medications: Current Facility-Administered Medications  Medication Dose Route Frequency Provider Last Rate Last Dose  . alum & mag hydroxide-simeth (MAALOX/MYLANTA) 200-200-20 MG/5ML suspension 30 mL  30 mL Oral Q4H PRN Clarene Reamer, MD      . amitriptyline (ELAVIL) tablet 450 mg  450 mg Oral QHS Clarene Reamer, MD    450 mg at 01/25/14 2241  . clonazePAM (KLONOPIN) tablet 1 mg  1 mg Oral BID Durward Parcel, MD      . cloNIDine (CATAPRES) tablet 0.1 mg  0.1 mg Oral BH-qamhs Evanna Cori Greig Castilla, NP   0.1 mg at 01/25/14 2242   Followed by  . [START ON 01/27/2014] cloNIDine (CATAPRES) tablet 0.1 mg  0.1 mg Oral QAC breakfast Evanna Cori Burkett, NP      . dicyclomine (BENTYL) tablet 20 mg  20 mg Oral Q6H PRN Evanna Glenda Chroman, NP      . hydrOXYzine (ATARAX/VISTARIL) tablet 50 mg  50 mg Oral QHS Benjamine Mola, FNP   50 mg at 01/25/14 2240  . lisinopril (PRINIVIL,ZESTRIL) tablet 5 mg  5 mg Oral Daily Clarene Reamer, MD   5 mg at 01/26/14 0854  . loperamide (IMODIUM) capsule 2-4 mg  2-4 mg Oral PRN Evanna Glenda Chroman, NP      . magnesium hydroxide (MILK OF MAGNESIA) suspension 30 mL  30 mL Oral Daily PRN Clarene Reamer, MD      . methocarbamol (ROBAXIN) tablet 500 mg  500 mg Oral Q8H PRN Evanna Glenda Chroman, NP      . metoprolol succinate (TOPROL-XL) 24 hr tablet 150 mg  150 mg Oral Daily Clarene Reamer, MD   150 mg at 01/26/14 7619  . naproxen (NAPROSYN) tablet 500 mg  500 mg Oral BID PRN Malena Peer, NP      . omega-3 acid ethyl esters (LOVAZA) capsule 1 g  1 g Oral BID Benjamine Mola, FNP   1 g at 01/26/14 0853  . ondansetron (ZOFRAN-ODT) disintegrating tablet 4 mg  4 mg Oral Q6H PRN Evanna Glenda Chroman, NP      . pantoprazole (PROTONIX) EC tablet 40 mg  40 mg Oral Daily Clarene Reamer, MD   40 mg at 01/26/14 5093  . phenytoin (DILANTIN) chewable tablet 200 mg  200 mg Oral QHS Clarene Reamer, MD   200 mg at 01/25/14 2239  . polyethylene glycol (MIRALAX / GLYCOLAX) packet 17 g  17 g Oral Daily Waylan Boga, NP   17 g at 01/26/14 0853  . QUEtiapine (SEROQUEL) tablet 800 mg  800 mg Oral QHS Benjamine Mola, FNP   800 mg at 01/25/14 2240  . simvastatin (ZOCOR) tablet 40 mg  40 mg Oral QHS Clarene Reamer, MD   40 mg at 01/25/14 2240  . tamsulosin (FLOMAX) capsule 0.4 mg  0.4 mg Oral Daily  Benjamine Mola, FNP   0.4 mg at 01/26/14 0853  . [START ON 02/03/2014] testosterone cypionate (DEPOTESTOTERONE CYPIONATE) injection 400 mg  400 mg Intramuscular Q14 Days Clarene Reamer, MD        Lab Results:  No results found for this or any previous visit (from the past 48 hour(s)).  Physical Findings: AIMS: Facial and Oral Movements Muscles of Facial Expression: None, normal Lips and Perioral Area: None, normal Jaw: None, normal Tongue: None, normal,Extremity Movements Upper (arms, wrists, hands, fingers): None, normal Lower (  legs, knees, ankles, toes): None, normal, Trunk Movements Neck, shoulders, hips: None, normal, Overall Severity Severity of abnormal movements (highest score from questions above): None, normal Incapacitation due to abnormal movements: None, normal Patient's awareness of abnormal movements (rate only patient's report): No Awareness, Dental Status Current problems with teeth and/or dentures?: No Does patient usually wear dentures?: No  CIWA:  CIWA-Ar Total: 0 COWS:  COWS Total Score: 0  Treatment Plan Summary: Daily contact with patient to assess and evaluate symptoms and progress in treatment Medication management  Plan: Review of chart, vital signs, medications, and notes.  1-Individual and group therapy  2-Medication management for depression and anxiety: Medications reviewed with the patient and he stated no untoward effects. -Continue Lovaza 1g PO bid for hypertriglyceridemia -Continue Flomax 0.4mg  PO daily for urinary urgency/frequency/retention  continue  Vistaril 50mg  qhs for insomnia - continue Seroquel 800mg  qhs for lethargy and difficulty waking. 3-Coping skills for depression, anxiety  4-Continue crisis stabilization and management  5-Address health issues--monitoring vital signs, stable  6-Treatment plan in progress to prevent relapse of depression and anxiety 7. Disposition plans are in progress   Medical Decision Making Problem Points:   Established problem, stable/improving (1), Review of last therapy session (1) and Review of psycho-social stressors (1) Data Points:  Review or order clinical lab tests (1) Review or order medicine tests (1) Review of medication regiment & side effects (2) Review of new medications or change in dosage (2)  I certify that inpatient services furnished can reasonably be expected to improve the patient's condition.   Garland Hincapie,JANARDHAHA R. 01/26/2014, 1:38 PM

## 2014-01-26 NOTE — Progress Notes (Signed)
Adult Psychoeducational Group Note  Date:  01/26/2014 Time: 2000  Group Topic/Focus:  Wrap-Up Group:   The focus of this group is to help patients review their daily goal of treatment and discuss progress on daily workbooks.  Participation Level:  Minimal  Participation Quality:  Appropriate  Affect:  Flat  Cognitive:  Appropriate  Insight: Limited  Engagement in Group:  Limited  Modes of Intervention:  Education and Support  Additional Comments:    [name] [name] 01/26/2014, 11:57 PM

## 2014-01-26 NOTE — BHH Group Notes (Signed)
[facility] LCSW Group Therapy      Feelings About Diagnosis 1:15 - 2:30 PM         01/26/2014  3:35 PM    Type of Therapy:  Group Therapy  Participation Level:  Active  Participation Quality:  Appropriate  Affect:  Appropriate  Cognitive:  Alert and Appropriate  Insight:  Developing/Improving and Engaged  Engagement in Therapy:  Developing/Improving and Engaged  Modes of Intervention:  Discussion, Education, Exploration, Problem-Solving, Rapport Building, Support  Summary of Progress/Problems:  Patient actively participated in group. Patient discussed past and present diagnosis and the effects it has had on  life.  Patient talked about family and society being judgmental and the stigma associated with having a mental health diagnosis.  Patient shared he accepts his diagnosis and that it does not effect who he is as a person.  Concha Pyo 01/26/2014  3:35 PM

## 2014-01-26 NOTE — Progress Notes (Signed)
Pt was observed sitting in the dayroom watching TV and occasionally talking with peers.  Pt came out to speak with Probation officer and reported that he is doing a little better since his admission.  He states he is concerned with his memory losses and says he probably needs to see a neurologist.  He also is concerned that he gets his medications because of his seizure disorder.  Spent some time talking with pt about his medications.  Pt denies SI/HI/AV to this Probation officer.  Pt says he attended some groups today.  He makes his needs known to staff.  Support and encouragement offered.  Safety maintained with q15 minute checks.

## 2014-01-26 NOTE — Progress Notes (Signed)
Recreation Therapy Notes  Animal-Assisted Activity/Therapy (AAA/T) Program Checklist/Progress Notes Patient Eligibility Criteria Checklist & Daily Group note for Rec Tx Intervention  Date: 02.10.2015 Time: 2:45pm Location: [address]   AAA/T Program Assumption of Risk Form signed by Patient/ or Parent Legal Guardian yes  Patient is free of allergies or sever asthma yes  Patient reports no fear of animals yes  Patient reports no history of cruelty to animals yes   Patient understands his/her participation is voluntary yes  Patient washes hands before animal contact yes  Patient washes hands after animal contact yes  Behavioral Response: Appropriate   Education: Hand Washing, Appropriate Animal Interaction   Education Outcome: Acknowledges understanding   Clinical Observations/Feedback: Patient interacted appropriately with therapeutic dog team.   Lane Hacker, LRT/CTRS  Lane Hacker 01/26/2014 4:35 PM

## 2014-01-26 NOTE — Progress Notes (Signed)
Pt reports he is doing better, but he repeats the same information that he gave Probation officer yesterday.  Pt is worried about his memory issues.  He denies SI/HI/AV.  He says he has been able to stay awake better today.  He says he will probably be discharged Wednesday to home.  He makes his needs known to staff.  Support and encouragement offered.  Safety maintained with q15 minute checks.

## 2014-01-27 ENCOUNTER — Encounter: Payer: Medicaid Other | Admitting: Physical Therapy

## 2014-01-27 DIAGNOSIS — F1320 Sedative, hypnotic or anxiolytic dependence, uncomplicated: Secondary | ICD-10-CM

## 2014-01-27 MED ORDER — CLONAZEPAM 1 MG PO TABS: 1.0000 mg | ORAL_TABLET | Freq: Two times a day (BID) | ORAL | Status: DC

## 2014-01-27 MED ORDER — OMEGA-3-ACID ETHYL ESTERS 1 G PO CAPS: 1.0000 g | ORAL_CAPSULE | Freq: Two times a day (BID) | ORAL | Status: DC

## 2014-01-27 MED ORDER — QUETIAPINE FUMARATE 400 MG PO TABS: 800.0000 mg | ORAL_TABLET | Freq: Every day | ORAL | Status: DC

## 2014-01-27 MED ORDER — AMITRIPTYLINE HCL 150 MG PO TABS: 450.0000 mg | ORAL_TABLET | Freq: Every day | ORAL | Status: DC

## 2014-01-27 NOTE — Progress Notes (Signed)
Adult Psychoeducational Group Note  Date:  01/27/2014 Time:  10:00am Group Topic/Focus:  Personal Choices and Values:   The focus of this group is to help patients assess and explore the importance of values in their lives, how their values affect their decisions, how they express their values and what opposes their expression.  Participation Level:  Minimal  Participation Quality:  Appropriate and Attentive  Affect:  Appropriate  Cognitive:  Alert and Appropriate  Insight: Appropriate  Engagement in Group:  Engaged  Modes of Intervention:  Discussion and Education  Additional Comments:  Pt attended and participated in group. Discussion was on personal development. Pt stated personal development means healing.  Marlowe Shores D 01/27/2014, 2:17 PM

## 2014-01-27 NOTE — Tx Team (Signed)
Interdisciplinary Treatment Plan Update   Date Reviewed:  01/27/2014  Time Reviewed:  10:03 AM  Progress in Treatment:   Attending groups: Yes Participating in groups: Yes Taking medication as prescribed: Yes  Tolerating medication: Yes Family/Significant other contact made:  No, advised of not having any support. Patient understands diagnosis: Yes  Discussing patient identified problems/goals with staff: Yes Medical problems stabilized or resolved: Yes Denies suicidal/homicidal ideation: Yes Patient has not harmed self or others: Yes  For review of initial/current patient goals, please see plan of care.  Estimated Length of Stay:  Discharge home today.  Reasons for Continued Hospitalization:   New Problems/Goals identified:    Discharge Plan or Barriers:   Home with outpatient follow up with [name]  Additional Comments:  N/A  Attendees:  Patient:  01/27/2014 10:03 AM   Signature: Mylinda Latina, MD 01/27/2014 10:03 AM  Signature:  Catalina Pizza, FNP 01/27/2014 10:03 AM  Signature:  Marilynne Halsted, RN  01/27/2014 10:03 AM  Signature: Gaylan Gerold, RN 01/27/2014 10:03 AM  Signature: Eduard Roux, RN 01/27/2014 10:03 AM  Signature:  Joette Catching, LCSW 01/27/2014 10:03 AM  Signature:  Regan Lemming, LCSW 01/27/2014 10:03 AM  Signature:  Lucinda Dell, Care Coordinator 01/27/2014 10:03 AM  Signature: 01/27/2014 10:03 AM  Signature 01/27/2014  10:03 AM  Signature:   Lars Pinks, RN [facility] 01/27/2014  10:03 AM  Signature:  01/27/2014  10:03 AM    Scribe for Treatment Team:   Joette Catching,  01/27/2014 10:03 AM

## 2014-01-27 NOTE — BHH Group Notes (Signed)
[facility] LCSW Aftercare Discharge Planning Group Note   01/27/2014 10:53 AM    Participation Quality:  Appropraite  Mood/Affect:  Appropriate  Depression Rating:  2  Anxiety Rating:  2  Thoughts of Suicide:  No  Will you contract for safety?   NA  Current AVH:  No  Plan for Discharge/Comments:  Patient attended discharge planning group and actively participated in group.  He will follow up with [name].  Patient was offered services by [facility] but declined. He was also informed of services provided by [facility].  CSW provided all participants with daily workbook.   Transportation Means: Patient has transportation.   Supports:  Patient has a support system.   Shataya Winkles, [name]

## 2014-01-27 NOTE — BHH Suicide Risk Assessment (Signed)
   Demographic Factors:  Male, Caucasian, Low socioeconomic status, Living alone and Unemployed  Total Time spent with patient: 30 minutes  Psychiatric Specialty Exam: Physical Exam  Review of Systems  All other systems reviewed and are negative.    Blood pressure 98/65, pulse 97, temperature 97.5 F (36.4 C), temperature source Oral, resp. rate 18, height 5\' 11"  (1.803 m), weight 86.637 kg (191 lb).Body mass index is 26.65 kg/(m^2).  General Appearance: Casual and Disheveled  Eye Contact::  Good  Speech:  Clear and Coherent  Volume:  Normal  Mood:  Anxious and Depressed  Affect:  Congruent and Depressed  Thought Process:  Goal Directed and Intact  Orientation:  Full (Time, Place, and Person)  Thought Content:  WDL  Suicidal Thoughts:  No  Homicidal Thoughts:  No  Memory:  Immediate;   Fair  Judgement:  Fair  Insight:  Fair  Psychomotor Activity:  Normal  Concentration:  Fair  Recall:  [employer] [place]  Language: Fair  Akathisia:  NA  Handed:  Right  AIMS (if indicated):     Assets:  Communication Skills Desire for Improvement Financial Resources/Insurance Housing Leisure Time Physical Health Resilience Social Support Transportation  Sleep:  Number of Hours: 4    Musculoskeletal: Strength & Muscle Tone: within normal limits Gait & Station: normal Patient leans: N/A   Mental Status Per Nursing Assessment::   On Admission:  Suicidal ideation indicated by patient  Current Mental Status by Physician: NA  Loss Factors: Financial problems/change in socioeconomic status  Historical Factors: Prior suicide attempts, Family history of mental illness or substance abuse and Impulsivity  Risk Reduction Factors:   Sense of responsibility to family, Religious beliefs about death, Positive social support, Positive therapeutic relationship and Positive coping skills or problem solving skills  Continued Clinical Symptoms:  Bipolar Disorder:   Mixed  State Alcohol/Substance Abuse/Dependencies Schizophrenia:   Paranoid or undifferentiated type Epilepsy Chronic Pain Unstable or Poor Therapeutic Relationship Previous Psychiatric Diagnoses and Treatments Medical Diagnoses and Treatments/Surgeries  Cognitive Features That Contribute To Risk:  Polarized thinking    Suicide Risk:  Minimal: No identifiable suicidal ideation.  Patients presenting with no risk factors but with morbid ruminations; may be classified as minimal risk based on the severity of the depressive symptoms  Discharge Diagnoses:   AXIS I:  Major Depression, Recurrent severe, Schizoaffective Disorder and Substance Abuse AXIS II:  Deferred AXIS III:   Past Medical History  Diagnosis Date  . Seizures   . Hypertension   . GERD (gastroesophageal reflux disease)   . Dyslipidemia   . Schizoaffective disorder   . Depression   . Anxiety   . MRSA carrier    AXIS IV:  economic problems, occupational problems, other psychosocial or environmental problems, problems related to social environment and problems with primary support group AXIS V:  51-60 moderate symptoms  Plan Of Care/Follow-up recommendations:  Activity:  As tolerated Diet:  Regular  Is patient on multiple antipsychotic therapies at discharge:  No   Has Patient had three or more failed trials of antipsychotic monotherapy by history:  No  Recommended Plan for Multiple Antipsychotic Therapies: NA    Jaidev Sanger,JANARDHAHA R. 01/27/2014, 1:49 PM

## 2014-01-27 NOTE — Progress Notes (Signed)
Discharge Note: Discharge instructions/prescriptions/medication samples given to patient. Patient verbalized understanding of discharge instructions and prescriptions. Returned belongings to patient. Denies SI/HI/AVH. Other RN staff d/c patient to the lobby.

## 2014-01-27 NOTE — Discharge Summary (Signed)
Physician Discharge Summary Note  Patient:  [name] is an 58 y.o., male MRN:  [id number] DOB:  05/29/1956 Patient phone:  [phone] (home)  Patient address:   [address] [address],  Total Time spent with patient: Greater than 30 minutes.  Date of Admission:  01/21/2014 Date of Discharge: 01/27/2014   Reason for Admission:  MDD with SI  Discharge Diagnoses: Active Problems:   Severe major depression   Opiate dependence   Psychiatric Specialty Exam: Physical Exam  Review of Systems  Constitutional: Negative.   HENT: Negative.   Eyes: Negative.   Respiratory: Negative.   Cardiovascular: Negative.   Gastrointestinal: Negative.   Genitourinary: Negative.   Musculoskeletal: Negative.   Skin: Negative.   Neurological: Negative.   Endo/Heme/Allergies: Negative.   Psychiatric/Behavioral: Positive for depression. Negative for suicidal ideas. The patient is nervous/anxious and has insomnia.     Blood pressure 98/65, pulse 97, temperature 97.5 F (36.4 C), temperature source Oral, resp. rate 18, height 5\' 11"  (1.803 m), weight 86.637 kg (191 lb).Body mass index is 26.65 kg/(m^2).  General Appearance: Disheveled  Eye Contact::  Good  Speech:  Clear and Coherent  Volume:  Normal  Mood:  Anxious and Depressed  Affect:  Depressed  Thought Process:  Coherent  Orientation:  Full (Time, Place, and Person)  Thought Content:  WDL  Suicidal Thoughts:  No  Homicidal Thoughts:  No  Memory:  Immediate;   Fair Recent;   Fair Remote;   Fair  Judgement:  Fair  Insight:  Fair  Psychomotor Activity:  Decreased  Concentration:  Fair  Recall:  Good  Fund of Knowledge:Good  Language: Good  Akathisia:  NA  Handed:  Right  AIMS (if indicated):     Assets:  Desire for Improvement Resilience  Sleep:  Number of Hours: 4    Musculoskeletal: Strength & Muscle Tone: within normal limits Gait & Station: normal Patient leans: N/A  DSM5:  Substance/Addictive  Disorders:  Opioid Disorder - Severe (304.00) Depressive Disorders:  Major Depressive Disorder - Severe (296.23)  Axis Diagnosis:   AXIS I:  Major Depression, Recurrent severe and Substance Abuse AXIS II:  Deferred AXIS III:   Past Medical History  Diagnosis Date  . Seizures   . Hypertension   . GERD (gastroesophageal reflux disease)   . Dyslipidemia   . Schizoaffective disorder   . Depression   . Anxiety   . MRSA carrier    AXIS IV:  other psychosocial or environmental problems and problems related to social environment AXIS V:  61-70 mild symptoms  Level of Care:  OP  Hospital Course:   58 y/o male who presents voluntarily for complaints of depression with suicidal ideations. Patient states he has been depressed and staying at home in bed. Patient appears to be fixated on his medications and when asked questions about why he is here patient states he wants to make sure he is not taken off of his xanax because he has to have it. Also patient states several times that he metabolized medications rapidly so he has to take large doses. Patient states he has no family or support system. Patient passive SI but verbally contracts for safety. Patient denies HI and denies AVH. Patient medical history: seizures, GERD, depression and anxiety. Patient skin searched and he has old self inflicted cuts to right arm, surgical scar to left shoulder.   During Hospitalization: Medications managed, psychoeducation, group and individual therapy. Pt currently denies SI, HI, and Psychosis. Pt  has been sleeping often and has trouble feeling alert and awake. At discharge, pt rates anxiety at 2/10 and depression at 2/10. Pt states that he will followup with outpatient treatment. Affirms agreement with medication regimen and discharge plan. Denies other physical and psychological concerns at time of discharge.    Consults:  None  Significant Diagnostic Studies:  None  Discharge Vitals:   Blood pressure  98/65, pulse 97, temperature 97.5 F (36.4 C), temperature source Oral, resp. rate 18, height 5\' 11"  (1.803 m), weight 86.637 kg (191 lb). Body mass index is 26.65 kg/(m^2). Lab Results:   No results found for this or any previous visit (from the past 72 hour(s)).  Physical Findings: AIMS: Facial and Oral Movements Muscles of Facial Expression: None, normal Lips and Perioral Area: None, normal Jaw: None, normal Tongue: None, normal,Extremity Movements Upper (arms, wrists, hands, fingers): None, normal Lower (legs, knees, ankles, toes): None, normal, Trunk Movements Neck, shoulders, hips: None, normal, Overall Severity Severity of abnormal movements (highest score from questions above): None, normal Incapacitation due to abnormal movements: None, normal Patient's awareness of abnormal movements (rate only patient's report): No Awareness, Dental Status Current problems with teeth and/or dentures?: No Does patient usually wear dentures?: No  CIWA:  CIWA-Ar Total: 0 COWS:  COWS Total Score: 0  Psychiatric Specialty Exam: See Psychiatric Specialty Exam and Suicide Risk Assessment completed by Attending Physician prior to discharge.  Discharge destination:  Home  Is patient on multiple antipsychotic therapies at discharge:  No   Has Patient had three or more failed trials of antipsychotic monotherapy by history:  No  Recommended Plan for Multiple Antipsychotic Therapies: NA     Medication List    STOP taking these medications       ALPRAZolam 1 MG tablet  Commonly known as:  XANAX      TAKE these medications     Indication   amitriptyline 150 MG tablet  Commonly known as:  ELAVIL  Take 3 tablets (450 mg total) by mouth at bedtime.   Indication:  mood stabilization     clonazePAM 1 MG tablet  Commonly known as:  KLONOPIN  Take 1 tablet (1 mg total) by mouth 2 (two) times daily.   Indication:  Simple Partial Seizures, anxiety     lisinopril 5 MG tablet  Commonly known  as:  PRINIVIL,ZESTRIL  Take 1 tablet (5 mg total) by mouth daily.      metoprolol succinate 50 MG 24 hr tablet  Commonly known as:  TOPROL XL  Take 3 tablets (150 mg total) by mouth daily. Take with or immediately following a meal.      multivitamin with minerals tablet  Take 1 tablet by mouth daily.      omega-3 acid ethyl esters 1 G capsule  Commonly known as:  LOVAZA  Take 1 capsule (1 g total) by mouth 2 (two) times daily.   Indication:  High Amount of Triglycerides in the Blood     omeprazole 20 MG capsule  Commonly known as:  PRILOSEC  Take 20 mg by mouth daily as needed. For heartburn or acid reflux.      phenytoin 50 MG tablet  Commonly known as:  DILANTIN  Chew 4 tablets (200 mg total) by mouth at bedtime.      polyethylene glycol packet  Commonly known as:  MIRALAX / GLYCOLAX  Take 17 g by mouth daily as needed. For constipation. Mix into 8 ounces of fluid & drink  QUEtiapine 400 MG tablet  Commonly known as:  SEROQUEL  Take 2 tablets (800 mg total) by mouth at bedtime.   Indication:  Trouble Sleeping     simvastatin 40 MG tablet  Commonly known as:  ZOCOR  Take 40 mg by mouth at bedtime.      testosterone cypionate 200 MG/ML injection  Commonly known as:  DEPOTESTOTERONE CYPIONATE  Inject 400 mg into the muscle every 14 (fourteen) days.            Follow-up Information   Follow up with Celesta Gentile - [name] On 02/04/2014. (Thursday, February 04, 2014 at 2:00 PM)    Contact information:   [address] [place], [place]   [address]  [phone]      Follow up with Dr. Reece Levy - [name] On 02/03/2014. (Wednesday, February 03, 2014 at 4:40 PM)    Contact information:   [address] [place], [place]   [address]  [phone]      Follow-up recommendations:  Activity:  As tolerated Diet:  Heart healthy with low sodium. Other:  Follow-up with outpatient therapist for medication management and counseling services (referred0>    Comments:   Take all medications as prescribed. Keep all follow-up appointments as scheduled.  Do not consume alcohol or use illegal drugs while on prescription medications. Report any adverse effects from your medications to your primary care provider promptly.  In the event of recurrent symptoms or worsening symptoms, call 911, a crisis hotline, or go to the nearest emergency department for evaluation.   Total Discharge Time:  Greater than 30 minutes.  Signed: Benjamine Mola, FNP-BC 01/27/2014, 1:29 PM  Patient was seen face-to-face for psychiatric evaluation, suicide risk assessment, discussed case with the treatment team and physician extender. Made disposition plan and reviewed the information documented and agree with the treatment plan.  Solara Goodchild,JANARDHAHA R. 01/28/2014 2:02 PM  Reviewed the information documented and agree with the treatment plan.  Graylee Arutyunyan,JANARDHAHA R. 01/28/2014 2:01 PM

## 2014-01-27 NOTE — Progress Notes (Signed)
[facility] Adult Case Management Discharge Plan :  Will you be returning to the same living situation after discharge: Yes, patient is returning to his home. At discharge, do you have transportation home?:Yes,  Patient reports having his car in the parking lot. Do you have the ability to pay for your medications:Yes,  Patient can afford medications.  Release of information consent forms completed and in the chart;  Patient's signature needed at discharge.  Patient to Follow up at: Follow-up Information   Follow up with Celesta Gentile - [name] On 02/04/2014. (Thursday, February 04, 2014 at 2:00 PM)    Contact information:   [address] [place], [place]   [address]  [phone]      Follow up with Dr. Reece Levy - [name] On 02/03/2014. (Wednesday, February 03, 2014 at 4:40 PM)    Contact information:   [address] [place], [place]   [address]  [phone]      Patient denies SI/HI:  Patient no longer endorsing SI/HI or other thoughts of self harm.  Safety Planning and Suicide Prevention discussed: .Reviewed with all patients during discharge planning group  [name], [name] 01/27/2014, 1:05 PM

## 2014-01-28 NOTE — Progress Notes (Signed)
Recreation Therapy Notes  Date: 02.11.2015 Time: 2:45pm Location: [address]   Group Topic: Anger Management  Goal Area(s) Addresses:  Patient will identify body's physical reaction to anger.  Patient will identify positive coping mechanisms to deal with anger.  Patient will select one coping mechanism of choice to use post d/c when experiencing anger.   Behavioral Response: Did not attend.    Laureen Ochs Glor[place] Stroope, LRT/CTRS   Lane Hacker 01/28/2014 9:41 AM

## 2014-02-01 NOTE — Progress Notes (Signed)
Patient Discharge Instructions:  After Visit Summary (AVS):   Faxed to:  02/01/14 Discharge Summary Note:   Faxed to:  02/01/14 Psychiatric Admission Assessment Note:   Faxed to:  02/01/14 Suicide Risk Assessment - Discharge Assessment:   Faxed to:  02/01/14 Faxed/Sent to the Next Level Care provider:  02/01/14 Faxed to [name] [place], 02/01/2014, 3:56 PM

## 2014-03-23 ENCOUNTER — Encounter: Payer: Self-pay | Admitting: Family Medicine

## 2014-03-23 ENCOUNTER — Ambulatory Visit (INDEPENDENT_AMBULATORY_CARE_PROVIDER_SITE_OTHER): Payer: Medicaid Other | Admitting: Family Medicine

## 2014-03-23 VITALS — BP 114/73 | HR 86 | Temp 98.3°F | Ht 71.0 in | Wt 200.0 lb

## 2014-03-23 DIAGNOSIS — N3944 Nocturnal enuresis: Secondary | ICD-10-CM | POA: Insufficient documentation

## 2014-03-23 NOTE — Patient Instructions (Signed)
[name],   It was nice to see you. You have no signs of prostate disease on exam. I think that your urinary night time symptoms are due to increased sedation from the medications. I recommend setting alarm to 4 hours after going to sleep. This will be to go to the bathroom. Also discussed with your psychiatrist about decreasing medications. I will make a note to Dr. Elpidio Anis followed by your medical records. Please followup with your regular doctor in 3-6 months.  Sincerely,   Dr. Maricela Bo

## 2014-03-23 NOTE — Assessment & Plan Note (Signed)
Assessment: No history or physical exam findings consistent with systemic illness or primary bladder problem responsible for this. Given the patient's fairly high doses of psychiatric medication, this is likely secondary to oversedation. Plan: Encourage behavioral modification including setting a long 3-4 hours after going to bed. also, I encouraged him to talk to his psychiatrist about reducing the doses of the sedating medications that he takes prior to bed.

## 2014-03-23 NOTE — Progress Notes (Signed)
   Subjective:    Patient ID: [name], male    DOB: 21-Dec-1955, 58 y.o.   MRN: [id number]  HPI  Nocturnal Enuresis - Pt worried about urination in his bed most nights; not accompanied by daily symptoms of urgency, pain, hesitancy, incomplete voiding; pt does not set an alarm to wake up; pt has constant amount of fluids "a couple glasses"; no hx of diabetes or recent UTI; patient taking numerous sedating medications at night including amitriptyline, clonazepam, and quetiapine which are managed by his psychiatrist  Prostate cancer screening - The patient is concerned that he may have prostate cancer. He denies any symptoms of difficulty with urination, incomplete voiding, or hesitancy. He knows that his PSA level was drawn 2 months ago and was normal. Nonetheless he would like to have physical exam of his prostate. He denies any history of prostate disease or cancer. He denies any current back pain.   Current Outpatient Prescriptions on File Prior to Visit  Medication Sig Dispense Refill  . amitriptyline (ELAVIL) 150 MG tablet Take 3 tablets (450 mg total) by mouth at bedtime.  90 tablet  0  . clonazePAM (KLONOPIN) 1 MG tablet Take 1 tablet (1 mg total) by mouth 2 (two) times daily.  28 tablet  0  . lisinopril (PRINIVIL,ZESTRIL) 5 MG tablet Take 1 tablet (5 mg total) by mouth daily.  90 tablet  3  . metoprolol succinate (TOPROL XL) 50 MG 24 hr tablet Take 3 tablets (150 mg total) by mouth daily. Take with or immediately following a meal.  270 tablet  3  . Multiple Vitamins-Minerals (MULTIVITAMIN WITH MINERALS) tablet Take 1 tablet by mouth daily.      Marland Kitchen omega-3 acid ethyl esters (LOVAZA) 1 G capsule Take 1 capsule (1 g total) by mouth 2 (two) times daily.  60 capsule  0  . omeprazole (PRILOSEC) 20 MG capsule Take 20 mg by mouth daily as needed. For heartburn or acid reflux.      . phenytoin (DILANTIN) 50 MG tablet Chew 4 tablets (200 mg total) by mouth at bedtime.  120 tablet  11  .  polyethylene glycol (MIRALAX / GLYCOLAX) packet Take 17 g by mouth daily as needed. For constipation. Mix into 8 ounces of fluid & drink  30 each  6  . QUEtiapine (SEROQUEL) 400 MG tablet Take 2 tablets (800 mg total) by mouth at bedtime.  60 tablet  0  . simvastatin (ZOCOR) 40 MG tablet Take 40 mg by mouth at bedtime.      Marland Kitchen testosterone cypionate (DEPOTESTOTERONE CYPIONATE) 200 MG/ML injection Inject 400 mg into the muscle every 14 (fourteen) days.       No current facility-administered medications on file prior to visit.     Review of Systems See history of present illness    Objective:   Physical Exam BP 114/73  Pulse 86  Temp(Src) 98.3 F (36.8 C) (Oral)  Ht 5\' 11"  (1.803 m)  Wt 200 lb (90.719 kg)  BMI 27.91 kg/m2  Gen. Middle-aged, overweight white male, disheveled in appearance Prostate: smooth and symmetric, no nodularity or tenderness Psych: normal affect,  Perseverating thought content, nondistressed      Assessment & Plan:  Prostate cancer screening - No history, no physical exam evidence and no PSA to suggest prostate disease at this time. We'll not check another prostate or PSA unless clinically indicated.

## 2014-04-07 ENCOUNTER — Other Ambulatory Visit: Payer: Self-pay | Admitting: Emergency Medicine

## 2014-04-08 ENCOUNTER — Emergency Department (HOSPITAL_COMMUNITY)
Admission: EM | Admit: 2014-04-08 | Discharge: 2014-04-08 | Disposition: A | Discharge status: Home / Self Care | Payer: Medicaid Other | Attending: Emergency Medicine | Admitting: Emergency Medicine

## 2014-04-08 ENCOUNTER — Encounter (HOSPITAL_COMMUNITY): Payer: Self-pay | Admitting: Emergency Medicine

## 2014-04-08 DIAGNOSIS — F3289 Other specified depressive episodes: Secondary | ICD-10-CM | POA: Insufficient documentation

## 2014-04-08 DIAGNOSIS — K219 Gastro-esophageal reflux disease without esophagitis: Secondary | ICD-10-CM | POA: Insufficient documentation

## 2014-04-08 DIAGNOSIS — F411 Generalized anxiety disorder: Secondary | ICD-10-CM | POA: Insufficient documentation

## 2014-04-08 DIAGNOSIS — E785 Hyperlipidemia, unspecified: Secondary | ICD-10-CM | POA: Insufficient documentation

## 2014-04-08 DIAGNOSIS — F259 Schizoaffective disorder, unspecified: Secondary | ICD-10-CM | POA: Insufficient documentation

## 2014-04-08 DIAGNOSIS — F329 Major depressive disorder, single episode, unspecified: Secondary | ICD-10-CM | POA: Insufficient documentation

## 2014-04-08 DIAGNOSIS — I10 Essential (primary) hypertension: Secondary | ICD-10-CM | POA: Insufficient documentation

## 2014-04-08 DIAGNOSIS — G40909 Epilepsy, unspecified, not intractable, without status epilepticus: Secondary | ICD-10-CM | POA: Insufficient documentation

## 2014-04-08 DIAGNOSIS — R55 Syncope and collapse: Secondary | ICD-10-CM

## 2014-04-08 DIAGNOSIS — I951 Orthostatic hypotension: Secondary | ICD-10-CM | POA: Insufficient documentation

## 2014-04-08 DIAGNOSIS — Z79899 Other long term (current) drug therapy: Secondary | ICD-10-CM | POA: Insufficient documentation

## 2014-04-08 LAB — CBC WITH DIFFERENTIAL/PLATELET
Basophils Absolute: 0 10*3/uL (ref 0.0–0.1)
Basophils Relative: 1 % (ref 0–1)
Eosinophils Absolute: 0.1 10*3/uL (ref 0.0–0.7)
Eosinophils Relative: 3 % (ref 0–5)
HCT: 38.7 % — ABNORMAL LOW (ref 39.0–52.0)
Hemoglobin: 13.6 g/dL (ref 13.0–17.0)
Lymphocytes Relative: 25 % (ref 12–46)
Lymphs Abs: 1.4 10*3/uL (ref 0.7–4.0)
MCH: 32.5 pg (ref 26.0–34.0)
MCHC: 35.1 g/dL (ref 30.0–36.0)
MCV: 92.6 fL (ref 78.0–100.0)
Monocytes Absolute: 0.3 10*3/uL (ref 0.1–1.0)
Monocytes Relative: 5 % (ref 3–12)
Neutro Abs: 3.6 10*3/uL (ref 1.7–7.7)
Neutrophils Relative %: 66 % (ref 43–77)
Platelets: 166 10*3/uL (ref 150–400)
RBC: 4.18 MIL/uL — ABNORMAL LOW (ref 4.22–5.81)
RDW: 14 % (ref 11.5–15.5)
WBC: 5.4 10*3/uL (ref 4.0–10.5)

## 2014-04-08 LAB — URINALYSIS, ROUTINE W REFLEX MICROSCOPIC
Bilirubin Urine: NEGATIVE
Glucose, UA: NEGATIVE mg/dL
Hgb urine dipstick: NEGATIVE
Ketones, ur: NEGATIVE mg/dL
Leukocytes, UA: NEGATIVE
Nitrite: NEGATIVE
Protein, ur: NEGATIVE mg/dL
Specific Gravity, Urine: 1.019 (ref 1.005–1.030)
Urobilinogen, UA: 0.2 mg/dL (ref 0.0–1.0)
pH: 6 (ref 5.0–8.0)

## 2014-04-08 LAB — COMPREHENSIVE METABOLIC PANEL
ALT: 40 U/L (ref 0–53)
AST: 25 U/L (ref 0–37)
Albumin: 3.6 g/dL (ref 3.5–5.2)
Alkaline Phosphatase: 98 U/L (ref 39–117)
BUN: 18 mg/dL (ref 6–23)
CO2: 25 mEq/L (ref 19–32)
Calcium: 8.9 mg/dL (ref 8.4–10.5)
Chloride: 99 mEq/L (ref 96–112)
Creatinine, Ser: 0.94 mg/dL (ref 0.50–1.35)
GFR calc Af Amer: >90 mL/min (ref >90)
GFR calc non Af Amer: 90 mL/min — ABNORMAL LOW (ref >90)
Glucose, Bld: 100 mg/dL — ABNORMAL HIGH (ref 70–99)
Potassium: 4.7 mEq/L (ref 3.7–5.3)
Sodium: 136 mEq/L — ABNORMAL LOW (ref 137–147)
Total Bilirubin: <0.2 mg/dL — ABNORMAL LOW (ref 0.3–1.2)
Total Protein: 6.7 g/dL (ref 6.0–8.3)

## 2014-04-08 LAB — I-STAT TROPONIN, ED: Troponin i, poc: 0 ng/mL (ref 0.00–0.08)

## 2014-04-08 MED ORDER — SODIUM CHLORIDE 0.9 % IV BOLUS (SEPSIS): 1000.0000 mL | Freq: Once | INTRAVENOUS | Status: DC

## 2014-04-08 MED ORDER — SODIUM CHLORIDE 0.9 % IV BOLUS (SEPSIS)
1000.0000 mL | Freq: Once | INTRAVENOUS | Status: AC
  Administered 2014-04-08: 1000 mL via INTRAVENOUS

## 2014-04-08 NOTE — ED Notes (Signed)
Pt drove himself to the dentist today. Per EMS pt doesn't have a way back to his car or home and the bus doesn't run to his house. The police on scene at dentist stated if we call them they will transport pt home.

## 2014-04-08 NOTE — ED Notes (Addendum)
Per EMS Pt was at dentist getting a deep cleaning when he was leaving they noticed pt had an abnormal gait. They called EMS and noticed pt was hypotensive. Pt was positive for orthostatic hypotensive. Pt not c/o any pain or c/o any symptoms when lying down. Pt was very lethargic and dizzy upon standing. CBG 125

## 2014-04-08 NOTE — ED Notes (Signed)
Patient is alert and oriented x3.  He was given DC instructions and follow up visit instructions.  Patient gave verbal understanding.  He was DC ambulatory under his own power to home.  V/S stable.  He was not showing any signs of distress on DC 

## 2014-04-08 NOTE — Discharge Instructions (Signed)
Do not take your blood pressure medications tonight. Take all regular medications otherwise. Please follow up with your primary care doctor for recheck in 2-3 days. Return if symptoms are worsening.    Orthostatic Hypotension Orthostatic hypotension is a sudden fall in blood pressure. It occurs when a person goes from a sitting or lying position to a standing position. CAUSES   Loss of body fluids (dehydration).  Medicines that lower blood pressure.  Sudden changes in posture, such as sudden standing when you have been sitting or lying down.  Taking too much of your medicine. SYMPTOMS   Lightheadedness or dizziness.  Fainting or near-fainting.  A fast heart rate (tachycardia).  Weakness.  Feeling tired (fatigue). DIAGNOSIS  Your caregiver may find the cause of orthostatic hypotension through:  A history and/or physical exam.  Checking your blood pressure. Your caregiver will check your blood pressure when you are:  Lying down.  Sitting.  Standing.  Tilt table testing. In this test, you are placed on a table that goes from a lying position to a standing position. You will be strapped to the table. This test helps to monitor your blood pressure and heart rate when you are in different positions. TREATMENT   If orthostatic hypotension is caused by your medicines, your caregiver will need to adjust your dosage. Do not stop or adjust your medicine on your own.  When changing positions, make these changes slowly. This allows your body to adjust to the different position.  Compression stockings that are worn on your lower legs may be helpful.  Your caregiver may have you consume extra salt. Do not add extra salt to your diet unless directed by your caregiver.  Eat frequent, small meals. Avoid sudden standing after eating.  Avoid hot showers or excessive heat.  Your caregiver may give you fluids through the vein (intravenous).  Your caregiver may put you on medicine to  help enhance fluid retention. SEEK IMMEDIATE MEDICAL CARE IF:   You faint or have a near-fainting episode. Call your local emergency services (911 in [place]  You have or develop chest pain.  You feel sick to your stomach (nauseous) or vomit.  You have a loss of feeling or movement in your arms or legs.  You have difficulty talking, slurred speech, or you are unable to talk.  You have difficulty thinking or have confused thinking. MAKE SURE YOU:   Understand these instructions.  Will watch your condition.  Will get help right away if you are not doing well or get worse. Document Released: 11/23/2002 Document Revised: 02/25/2012 Document Reviewed: 03/18/2009 [facility] Patient Information 2014 [place], [place].

## 2014-04-08 NOTE — ED Provider Notes (Signed)
CSN: [id number]     Arrival date & time 04/08/14  1749 History   First MD Initiated Contact with Patient 04/08/14 1752     Chief Complaint  Patient presents with  . Hypotension     (Consider location/radiation/quality/duration/timing/severity/associated sxs/prior Treatment) HPI [name] is a 58 y.o. male who presents to emergency department complaining of a near-syncopal episode. Patient states he was at the dentist, states he got up and started walking when he became dizzy and states he felt like he is going to faint. Patient states he checked his blood pressure was low. They transported him here.   Patient states that he is asymptomatic at present. He denies any associated chest pain, shortness of breath, abdominal pain, back pain. He denies being recently sick. He states he did not eat any breakfast or lunch today. States they gave him some water and crackers after this episode and now he is feeling better. Patient states he does take blood pressure medications, states take them at night only. Reports taking them last night. He denies starting on any new medications or having his dosages adjusted. Denies any prior cardiac problems. He states he has had similar episodes in the past but does not know what caused them. No other complaints.  Past Medical History  Diagnosis Date  . Seizures   . Hypertension   . GERD (gastroesophageal reflux disease)   . Dyslipidemia   . Schizoaffective disorder   . Depression   . Anxiety   . MRSA carrier    Past Surgical History  Procedure Laterality Date  . Self orchectomy    . Colonoscopy  2010  . Orif shoulder fracture    . Shoulder closed reduction Left 09/16/2013    Procedure: CLOSED REDUCTION SHOULDER;  Surgeon: Mauri Pole, MD;  Location: [facility];  Service: Orthopedics;  Laterality: Left;  . Shoulder hemi-arthroplasty Left 09/18/2013    Procedure: LEFT SHOULDER HEMI-ARTHROPLASTY;  Surgeon: Augustin Schooling, MD;  Location: [facility];  Service:  Orthopedics;  Laterality: Left;   Family History  Problem Relation Age of Onset  . Stroke Father     Living at 37  . Stroke Mother     Stroke in late 54's. Died in her 50's   History  Substance Use Topics  . Smoking status: Never Smoker   . Smokeless tobacco: Never Used  . Alcohol Use: No    Review of Systems  Constitutional: Negative for fever and chills.  HENT: Negative for congestion.   Eyes: Negative for visual disturbance.  Respiratory: Negative for cough, chest tightness and shortness of breath.   Cardiovascular: Negative for chest pain, palpitations and leg swelling.  Gastrointestinal: Negative for nausea, vomiting, abdominal pain, diarrhea and abdominal distention.  Genitourinary: Negative for dysuria, urgency, frequency and hematuria.  Musculoskeletal: Negative for arthralgias, neck pain and neck stiffness.  Skin: Negative for rash.  Allergic/Immunologic: Negative for immunocompromised state.  Neurological: Negative for dizziness, weakness, light-headedness, numbness and headaches.  Psychiatric/Behavioral: Negative for confusion and agitation.  All other systems reviewed and are negative.     Allergies  Codeine and Sulfa antibiotics  Home Medications   Prior to Admission medications   Medication Sig Start Date End Date Taking? Authorizing Provider  amitriptyline (ELAVIL) 150 MG tablet Take 3 tablets (450 mg total) by mouth at bedtime. 01/27/14   Benjamine Mola, FNP  clonazePAM (KLONOPIN) 1 MG tablet Take 1 tablet (1 mg total) by mouth 2 (two) times daily. 01/27/14   Benjamine Mola,  FNP  lisinopril (PRINIVIL,ZESTRIL) 5 MG tablet Take 1 tablet (5 mg total) by mouth daily. 12/21/13   Melony Overly, MD  metoprolol succinate (TOPROL XL) 50 MG 24 hr tablet Take 3 tablets (150 mg total) by mouth daily. Take with or immediately following a meal. 12/21/13   Melony Overly, MD  Multiple Vitamins-Minerals (MULTIVITAMIN WITH MINERALS) tablet Take 1 tablet by mouth daily.     Historical Provider, MD  omega-3 acid ethyl esters (LOVAZA) 1 G capsule Take 1 capsule (1 g total) by mouth 2 (two) times daily. 01/27/14   Benjamine Mola, FNP  omeprazole (PRILOSEC) 20 MG capsule Take 20 mg by mouth daily as needed. For heartburn or acid reflux.    Historical Provider, MD  phenytoin (DILANTIN) 50 MG tablet Chew 4 tablets (200 mg total) by mouth at bedtime. 01/07/14   Melony Overly, MD  polyethylene glycol (MIRALAX / GLYCOLAX) packet Take 17 g by mouth daily as needed. For constipation. Mix into 8 ounces of fluid & drink 07/23/13   Kandis Nab, MD  QUEtiapine (SEROQUEL) 400 MG tablet Take 2 tablets (800 mg total) by mouth at bedtime. 01/27/14   Benjamine Mola, FNP  simvastatin (ZOCOR) 40 MG tablet Take 40 mg by mouth at bedtime.    Historical Provider, MD  testosterone cypionate (DEPOTESTOTERONE CYPIONATE) 200 MG/ML injection Inject 400 mg into the muscle every 14 (fourteen) days.    Historical Provider, MD   BP 96/61  Pulse 74  Temp(Src) 98.3 F (36.8 C) (Oral)  Resp 19  SpO2 96% Physical Exam  Nursing note and vitals reviewed. Constitutional: He appears well-developed and well-nourished. No distress.  HENT:  Head: Normocephalic and atraumatic.  Eyes: Conjunctivae and EOM are normal. Pupils are equal, round, and reactive to light.  Neck: Neck supple.  Cardiovascular: Normal rate, regular rhythm, normal heart sounds and intact distal pulses.   Pulmonary/Chest: Effort normal. No respiratory distress. He has no wheezes. He has no rales.  Abdominal: Soft. Bowel sounds are normal. He exhibits no distension. There is no tenderness. There is no rebound.  Musculoskeletal: He exhibits no edema.  Neurological: He is alert.  Skin: Skin is warm and dry.  Psychiatric: He has a normal mood and affect. His behavior is normal.    ED Course  Procedures (including critical care time) Labs Review Labs Reviewed  CBC WITH DIFFERENTIAL - Abnormal; Notable for the following:    RBC 4.18  (*)    HCT 38.7 (*)    All other components within normal limits  COMPREHENSIVE METABOLIC PANEL - Abnormal; Notable for the following:    Sodium 136 (*)    Glucose, Bld 100 (*)    Total Bilirubin <0.2 (*)    GFR calc non Af Amer 90 (*)    All other components within normal limits  URINE CULTURE  URINALYSIS, ROUTINE W REFLEX MICROSCOPIC  I-STAT TROPOININ, ED    Imaging Review No results found.   EKG Interpretation   Date/Time:  Thursday April 08 2014 17:57:26 EDT Ventricular Rate:  76 PR Interval:  194 QRS Duration: 125 QT Interval:  417 QTC Calculation: 469 R Axis:   -24 Text Interpretation:  Sinus rhythm Nonspecific intraventricular conduction  delay Borderline T abnormalities, inferior leads Confirmed by Ashok Cordia  MD,  Lennette Bihari ([id number]) on 04/08/2014 6:26:14 PM      MDM   Final diagnoses:  Orthostatic hypotension  Near syncope   6:14 PM Pt seen and examined. Here with near syncopal  episode while at the dentist office. He is orthosetatic. Did not eat or drink anything all day. Will get labs, start IV fluids, will monitor.    8:50 PM Pt rehydrated. No significant lab abnormalities. Ambulated with no distress, no dizziness. Pt also ate in ED and drinking fluids. Feels much better. I suspect his near syncope is from orthostatic hypotension. Clinically it improved. Home with close pcp follow up.   Filed Vitals:   04/08/14 1754 04/08/14 1757 04/08/14 2005 04/08/14 2009  BP: 119/76 96/61 115/77 117/73  Pulse: 74 74    Temp:      TempSrc:      Resp:      SpO2:           Renold Genta, PA-C 04/08/14 2054  Florene Route [employer], PA-C 04/08/14 2056

## 2014-04-08 NOTE — ED Notes (Signed)
Bed: WA03 Expected date:  Expected time:  Means of arrival:  Comments: EMS- orthostatic hypotension

## 2014-04-09 LAB — URINE CULTURE
Colony Count: NO GROWTH
Culture: NO GROWTH

## 2014-04-09 NOTE — ED Provider Notes (Signed)
Medical screening examination/treatment/procedure(s) were conducted as a shared visit with non-physician practitioner(s) and myself.  I personally evaluated the patient during the encounter.   EKG Interpretation   Date/Time:  Thursday April 08 2014 17:57:26 EDT Ventricular Rate:  76 PR Interval:  194 QRS Duration: 125 QT Interval:  417 QTC Calculation: 469 R Axis:   -24 Text Interpretation:  Sinus rhythm Nonspecific intraventricular conduction  delay Borderline T abnormalities, inferior leads Confirmed by Ashok Cordia  MD,  Lennette Bihari ([id number]) on 04/08/2014 6:26:14 PM      Pt notes at dentist, got up and felt lightheaded/faint. Hx same  - pt states if I stand up too quickly I know this will happen. No cp. No palpitations. Denies fever or chills. No recent blood loss, rectal bleeding or melena. Pt currently states feels at baseline. Labs.   Mirna Mires, MD 04/09/14 1201

## 2014-04-15 ENCOUNTER — Ambulatory Visit: Payer: Self-pay | Admitting: Emergency Medicine

## 2014-04-20 ENCOUNTER — Ambulatory Visit: Payer: Self-pay | Admitting: Emergency Medicine

## 2014-04-26 ENCOUNTER — Ambulatory Visit: Payer: Self-pay | Admitting: Emergency Medicine

## 2014-05-02 ENCOUNTER — Encounter (HOSPITAL_BASED_OUTPATIENT_CLINIC_OR_DEPARTMENT_OTHER): Payer: Self-pay | Admitting: Emergency Medicine

## 2014-05-02 ENCOUNTER — Emergency Department (HOSPITAL_BASED_OUTPATIENT_CLINIC_OR_DEPARTMENT_OTHER)
Admission: EM | Admit: 2014-05-02 | Discharge: 2014-05-02 | Disposition: A | Discharge status: Home / Self Care | Payer: Medicaid Other | Attending: Emergency Medicine | Admitting: Emergency Medicine

## 2014-05-02 DIAGNOSIS — I10 Essential (primary) hypertension: Secondary | ICD-10-CM | POA: Insufficient documentation

## 2014-05-02 DIAGNOSIS — Z8614 Personal history of Methicillin resistant Staphylococcus aureus infection: Secondary | ICD-10-CM | POA: Insufficient documentation

## 2014-05-02 DIAGNOSIS — G40909 Epilepsy, unspecified, not intractable, without status epilepticus: Secondary | ICD-10-CM | POA: Insufficient documentation

## 2014-05-02 DIAGNOSIS — E785 Hyperlipidemia, unspecified: Secondary | ICD-10-CM | POA: Insufficient documentation

## 2014-05-02 DIAGNOSIS — K219 Gastro-esophageal reflux disease without esophagitis: Secondary | ICD-10-CM | POA: Insufficient documentation

## 2014-05-02 DIAGNOSIS — F259 Schizoaffective disorder, unspecified: Secondary | ICD-10-CM | POA: Insufficient documentation

## 2014-05-02 DIAGNOSIS — F3289 Other specified depressive episodes: Secondary | ICD-10-CM | POA: Insufficient documentation

## 2014-05-02 DIAGNOSIS — Z79899 Other long term (current) drug therapy: Secondary | ICD-10-CM | POA: Insufficient documentation

## 2014-05-02 DIAGNOSIS — F411 Generalized anxiety disorder: Secondary | ICD-10-CM | POA: Insufficient documentation

## 2014-05-02 DIAGNOSIS — F329 Major depressive disorder, single episode, unspecified: Secondary | ICD-10-CM | POA: Insufficient documentation

## 2014-05-02 DIAGNOSIS — R55 Syncope and collapse: Secondary | ICD-10-CM

## 2014-05-02 NOTE — ED Provider Notes (Signed)
CSN: [id number]     Arrival date & time 05/02/14  1541 History  This chart was scribed for Babette Relic, MD by Marcha Dutton, ED Scribe. This patient was seen in room MH04/MH04 and the patient's care was started at 4:43 PM.    Chief Complaint  Patient presents with  . Loss of Consciousness    The history is provided by the patient and a friend. No language interpreter was used.    HPI Comments: [name] is a 58 y.o. male with a who presents to the Emergency Department complaining of 2 near syncopal episodes earlier today. Pt reports a h/o of multiple orthostatic syncope and pre syncope over the years. Pt's EKG unchanged since last episode. Pt was last seen at [facility] 04/08/2014 for a near fainting episode. Pt states he got up quickly out of his car at a gas station and felt lightheaded and weak all over as he has before. He reports he kneeled down afraid of falling and hitting his head should he pass out. He states quick position changes seem to incur these symptoms. Pt reports he has anticholinergic side effects due to his medications. No syncope today, no CP, SOB, palpitations, headache, or focal neuro Sxs. Feels back to baseline now.   Past Medical History  Diagnosis Date  . Seizures   . Hypertension   . GERD (gastroesophageal reflux disease)   . Dyslipidemia   . Schizoaffective disorder   . Depression   . Anxiety   . MRSA carrier     Past Surgical History  Procedure Laterality Date  . Self orchectomy    . Colonoscopy  2010  . Orif shoulder fracture    . Shoulder closed reduction Left 09/16/2013    Procedure: CLOSED REDUCTION SHOULDER;  Surgeon: Mauri Pole, MD;  Location: [facility];  Service: Orthopedics;  Laterality: Left;  . Shoulder hemi-arthroplasty Left 09/18/2013    Procedure: LEFT SHOULDER HEMI-ARTHROPLASTY;  Surgeon: Augustin Schooling, MD;  Location: [place];  Service: Orthopedics;  Laterality: Left;    Family History  Problem Relation Age of Onset  .  Stroke Father     Living at 58  . Stroke Mother     Stroke in late 51's. Died in her 61's    History  Substance Use Topics  . Smoking status: Never Smoker   . Smokeless tobacco: Never Used  . Alcohol Use: No    Review of Systems 10 Systems reviewed and all are negative for acute change except as noted in the HPI.   Allergies  Codeine and Sulfa antibiotics   Home Medications   Prior to Admission medications   Medication Sig Start Date End Date Taking? Authorizing Provider  amitriptyline (ELAVIL) 150 MG tablet Take 3 tablets (450 mg total) by mouth at bedtime. 01/27/14   Benjamine Mola, FNP  clonazePAM (KLONOPIN) 1 MG tablet Take 1 tablet (1 mg total) by mouth 2 (two) times daily. 01/27/14   Benjamine Mola, FNP  lisinopril (PRINIVIL,ZESTRIL) 5 MG tablet Take 1 tablet (5 mg total) by mouth daily. 12/21/13   Melony Overly, MD  metoprolol succinate (TOPROL XL) 50 MG 24 hr tablet Take 3 tablets (150 mg total) by mouth daily. Take with or immediately following a meal. 12/21/13   Melony Overly, MD  Multiple Vitamins-Minerals (MULTIVITAMIN WITH MINERALS) tablet Take 1 tablet by mouth daily.    Historical Provider, MD  omega-3 acid ethyl esters (LOVAZA) 1 G capsule Take 1 capsule (  1 g total) by mouth 2 (two) times daily. 01/27/14   Benjamine Mola, FNP  omeprazole (PRILOSEC) 20 MG capsule Take 20 mg by mouth daily as needed. For heartburn or acid reflux.    Historical Provider, MD  phenytoin (DILANTIN) 50 MG tablet Chew 4 tablets (200 mg total) by mouth at bedtime. 01/07/14   Melony Overly, MD  polyethylene glycol (MIRALAX / GLYCOLAX) packet Take 17 g by mouth daily as needed. For constipation. Mix into 8 ounces of fluid & drink 07/23/13   Kandis Nab, MD  QUEtiapine (SEROQUEL) 400 MG tablet Take 2 tablets (800 mg total) by mouth at bedtime. 01/27/14   Benjamine Mola, FNP  simvastatin (ZOCOR) 40 MG tablet Take 40 mg by mouth at bedtime.    Historical Provider, MD  testosterone cypionate  (DEPOTESTOTERONE CYPIONATE) 200 MG/ML injection Inject 400 mg into the muscle every 14 (fourteen) days.    Historical Provider, MD    Triage Vitals: BP 108/59  Pulse 67  Temp(Src) 98.8 F (37.1 C) (Oral)  Resp 24  Ht 5\' 11"  (1.803 m)  Wt 190 lb (86.183 kg)  BMI 26.51 kg/m2  SpO2 96%   Physical Exam  Nursing note and vitals reviewed. Constitutional: He is oriented to person, place, and time. He appears well-developed and well-nourished. No distress.  Awake, alert, nontoxic appearance.  HENT:  Head: Normocephalic and atraumatic.  Eyes: Right eye exhibits no discharge. Left eye exhibits no discharge.  Neck: Normal range of motion. Neck supple.  Neck c-spine non tender  Cardiovascular: Normal rate, regular rhythm and normal heart sounds.  Exam reveals no gallop and no friction rub.   No murmur heard. Pulmonary/Chest: Effort normal and breath sounds normal. No respiratory distress. He has no wheezes. He has no rales. He exhibits no tenderness.  Abdominal: Soft. Bowel sounds are normal. There is no tenderness. There is no rebound and no guarding.  Musculoskeletal: He exhibits no edema and no tenderness.  Baseline ROM, no obvious new focal weakness.  Neurological: He is alert and oriented to person, place, and time.  Mental status and motor strength appears baseline for patient and situation.  Skin: Skin is warm and dry. No rash noted. He is not diaphoretic.  Psychiatric: He has a normal mood and affect.    ED Course  Procedures (including critical care time)   DIAGNOSTIC STUDIES: Oxygen Saturation is 96% on RA, normal by my interpretation.     COORDINATION OF CARE: 4:54 PM- Patient / Family / Caregiver understand and agree with initial ED impression and plan with expectations set for ED visit.   Labs Review Labs Reviewed - No data to display   Imaging Review No results found.    EKG Interpretation   Date/Time:  Sunday May 02 2014 15:55:35 EDT Ventricular Rate:   68 PR Interval:  190 QRS Duration: 118 QT Interval:  450 QTC Calculation: 478 R Axis:   -19 Text Interpretation:  Normal sinus rhythm Non-specific intra-ventricular  conduction delay Nonspecific T wave abnormality Prolonged QT No  significant change since last tracing Confirmed by [facility]  MD, [name]  [phone]) on 05/02/2014 4:43:24 PM     MDM  The patient appears reasonably screened and/or stabilized for discharge and I doubt any other medical condition or other [facility] requiring further screening, evaluation, or treatment in the ED at this time prior to discharge.  Final diagnoses:  Near syncope    I doubt any other EMC precluding discharge at this time including,  but not necessarily limited to the following:serious cardiac dysrhythmia.  I personally performed the services described in this documentation, which was scribed in my presence. The recorded information has been reviewed and is accurate.    Babette Relic, MD 05/04/14 [phone]

## 2014-05-02 NOTE — ED Notes (Signed)
Pt had syncopal episode at gas station witnessed by gas station attendant- pt has hx of orthostatic hypotension- initially refused transport but had syncopal episode when leaving ambulance- recent hx of GI bleed also

## 2014-05-02 NOTE — Discharge Instructions (Signed)
Your caregiver has seen you today because you are having problems with feelings of weakness, dizziness, and/or fatigue. Weakness has many different causes, some of which are common and others are very rare. Your caregiver has considered some of the most common causes of weakness and feels it is safe for you to go home and be observed. Not every illness or injury can be identified during an emergency department visit, thus follow-up with your primary healthcare provider is important. Medical conditions can also worsen, so it is also important to return immediately as directed below, or if you have other serious concerns develop. RETURN IMMEDIATELY IF you develop new shortness of breath, chest pain, fever, have difficulty moving parts of your body (new weakness, numbness, or incoordination), sudden change in speech, vision, swallowing, or understanding, faint or develop new dizziness, severe headache, become poorly responsive or have an altered mental status compared to baseline for you, new rash, abdominal pain, or bloody stools,  Return sooner also if you develop new problems for which you have not talked to your caregiver but you feel may be emergency medical conditions, or are unable to be cared for safely at home.  

## 2014-05-04 ENCOUNTER — Ambulatory Visit: Payer: Self-pay | Admitting: Emergency Medicine

## 2014-05-07 ENCOUNTER — Encounter (HOSPITAL_COMMUNITY): Payer: Self-pay | Admitting: Emergency Medicine

## 2014-05-07 DIAGNOSIS — R45851 Suicidal ideations: Secondary | ICD-10-CM | POA: Insufficient documentation

## 2014-05-07 DIAGNOSIS — F411 Generalized anxiety disorder: Secondary | ICD-10-CM | POA: Insufficient documentation

## 2014-05-07 DIAGNOSIS — I10 Essential (primary) hypertension: Secondary | ICD-10-CM | POA: Insufficient documentation

## 2014-05-07 DIAGNOSIS — F259 Schizoaffective disorder, unspecified: Secondary | ICD-10-CM | POA: Insufficient documentation

## 2014-05-07 DIAGNOSIS — K219 Gastro-esophageal reflux disease without esophagitis: Secondary | ICD-10-CM | POA: Insufficient documentation

## 2014-05-07 DIAGNOSIS — E785 Hyperlipidemia, unspecified: Secondary | ICD-10-CM | POA: Insufficient documentation

## 2014-05-07 DIAGNOSIS — F1310 Sedative, hypnotic or anxiolytic abuse, uncomplicated: Secondary | ICD-10-CM | POA: Insufficient documentation

## 2014-05-07 DIAGNOSIS — F3289 Other specified depressive episodes: Secondary | ICD-10-CM | POA: Insufficient documentation

## 2014-05-07 DIAGNOSIS — Z8614 Personal history of Methicillin resistant Staphylococcus aureus infection: Secondary | ICD-10-CM | POA: Insufficient documentation

## 2014-05-07 DIAGNOSIS — F329 Major depressive disorder, single episode, unspecified: Secondary | ICD-10-CM | POA: Insufficient documentation

## 2014-05-07 DIAGNOSIS — Z79899 Other long term (current) drug therapy: Secondary | ICD-10-CM | POA: Insufficient documentation

## 2014-05-07 DIAGNOSIS — G40909 Epilepsy, unspecified, not intractable, without status epilepticus: Secondary | ICD-10-CM | POA: Insufficient documentation

## 2014-05-07 NOTE — ED Notes (Signed)
Per pt, "I am afraid of being homeless and I am not street smart. I am just too overwhelmed. I am not homeless. I want to kill myself, yes. The least painful way possible by inhaling helium and you buy canisters and duct tape and you inhale it." pt is anxious and jiterry.

## 2014-05-08 ENCOUNTER — Encounter (HOSPITAL_COMMUNITY): Payer: Self-pay | Admitting: Emergency Medicine

## 2014-05-08 ENCOUNTER — Emergency Department (HOSPITAL_COMMUNITY)
Admission: EM | Admit: 2014-05-08 | Discharge: 2014-05-08 | Disposition: A | Discharge status: Other Acute Inpatient Hospital | Payer: Medicaid Other | Attending: Emergency Medicine | Admitting: Emergency Medicine

## 2014-05-08 ENCOUNTER — Inpatient Hospital Stay (HOSPITAL_COMMUNITY)
Admission: AD | Admit: 2014-05-08 | Discharge: 2014-05-17 | DRG: 885 | Disposition: A | Discharge status: Home / Self Care | Payer: Medicaid Other | Source: Intra-hospital | Attending: Psychiatry | Admitting: Psychiatry

## 2014-05-08 ENCOUNTER — Encounter (HOSPITAL_COMMUNITY): Payer: Self-pay | Admitting: *Deleted

## 2014-05-08 DIAGNOSIS — M7500 Adhesive capsulitis of unspecified shoulder: Secondary | ICD-10-CM

## 2014-05-08 DIAGNOSIS — F32A Depression, unspecified: Secondary | ICD-10-CM

## 2014-05-08 DIAGNOSIS — F332 Major depressive disorder, recurrent severe without psychotic features: Secondary | ICD-10-CM | POA: Diagnosis present

## 2014-05-08 DIAGNOSIS — R400 Somnolence: Secondary | ICD-10-CM

## 2014-05-08 DIAGNOSIS — Z5987 Material hardship due to limited financial resources, not elsewhere classified: Secondary | ICD-10-CM

## 2014-05-08 DIAGNOSIS — F322 Major depressive disorder, single episode, severe without psychotic features: Secondary | ICD-10-CM

## 2014-05-08 DIAGNOSIS — K5909 Other constipation: Secondary | ICD-10-CM

## 2014-05-08 DIAGNOSIS — G4700 Insomnia, unspecified: Secondary | ICD-10-CM | POA: Diagnosis present

## 2014-05-08 DIAGNOSIS — F329 Major depressive disorder, single episode, unspecified: Secondary | ICD-10-CM

## 2014-05-08 DIAGNOSIS — Z125 Encounter for screening for malignant neoplasm of prostate: Secondary | ICD-10-CM

## 2014-05-08 DIAGNOSIS — F411 Generalized anxiety disorder: Secondary | ICD-10-CM | POA: Diagnosis present

## 2014-05-08 DIAGNOSIS — R5383 Other fatigue: Secondary | ICD-10-CM

## 2014-05-08 DIAGNOSIS — M899 Disorder of bone, unspecified: Secondary | ICD-10-CM

## 2014-05-08 DIAGNOSIS — H524 Presbyopia: Secondary | ICD-10-CM

## 2014-05-08 DIAGNOSIS — R45851 Suicidal ideations: Secondary | ICD-10-CM

## 2014-05-08 DIAGNOSIS — E785 Hyperlipidemia, unspecified: Secondary | ICD-10-CM | POA: Diagnosis present

## 2014-05-08 DIAGNOSIS — K7689 Other specified diseases of liver: Secondary | ICD-10-CM

## 2014-05-08 DIAGNOSIS — E291 Testicular hypofunction: Secondary | ICD-10-CM

## 2014-05-08 DIAGNOSIS — Z5989 Other problems related to housing and economic circumstances: Secondary | ICD-10-CM

## 2014-05-08 DIAGNOSIS — F1120 Opioid dependence, uncomplicated: Secondary | ICD-10-CM | POA: Diagnosis present

## 2014-05-08 DIAGNOSIS — Z823 Family history of stroke: Secondary | ICD-10-CM

## 2014-05-08 DIAGNOSIS — F259 Schizoaffective disorder, unspecified: Principal | ICD-10-CM | POA: Diagnosis present

## 2014-05-08 DIAGNOSIS — R000 Tachycardia, unspecified: Secondary | ICD-10-CM

## 2014-05-08 DIAGNOSIS — K219 Gastro-esophageal reflux disease without esophagitis: Secondary | ICD-10-CM | POA: Diagnosis present

## 2014-05-08 DIAGNOSIS — M949 Disorder of cartilage, unspecified: Secondary | ICD-10-CM

## 2014-05-08 DIAGNOSIS — Z598 Other problems related to housing and economic circumstances: Secondary | ICD-10-CM

## 2014-05-08 DIAGNOSIS — Z8669 Personal history of other diseases of the nervous system and sense organs: Secondary | ICD-10-CM

## 2014-05-08 DIAGNOSIS — N3944 Nocturnal enuresis: Secondary | ICD-10-CM

## 2014-05-08 DIAGNOSIS — R5381 Other malaise: Secondary | ICD-10-CM

## 2014-05-08 DIAGNOSIS — F429 Obsessive-compulsive disorder, unspecified: Secondary | ICD-10-CM | POA: Diagnosis present

## 2014-05-08 DIAGNOSIS — I10 Essential (primary) hypertension: Secondary | ICD-10-CM | POA: Diagnosis present

## 2014-05-08 DIAGNOSIS — F1320 Sedative, hypnotic or anxiolytic dependence, uncomplicated: Secondary | ICD-10-CM | POA: Diagnosis present

## 2014-05-08 DIAGNOSIS — E663 Overweight: Secondary | ICD-10-CM

## 2014-05-08 LAB — COMPREHENSIVE METABOLIC PANEL
ALT: 34 U/L (ref 0–53)
AST: 34 U/L (ref 0–37)
Albumin: 3.6 g/dL (ref 3.5–5.2)
Alkaline Phosphatase: 89 U/L (ref 39–117)
BUN: 11 mg/dL (ref 6–23)
CO2: 23 mEq/L (ref 19–32)
Calcium: 9.3 mg/dL (ref 8.4–10.5)
Chloride: 101 mEq/L (ref 96–112)
Creatinine, Ser: 0.93 mg/dL (ref 0.50–1.35)
GFR calc Af Amer: >90 mL/min (ref >90)
GFR calc non Af Amer: >90 mL/min (ref >90)
Glucose, Bld: 142 mg/dL — ABNORMAL HIGH (ref 70–99)
Potassium: 4.1 mEq/L (ref 3.7–5.3)
Sodium: 140 mEq/L (ref 137–147)
Total Bilirubin: <0.2 mg/dL — ABNORMAL LOW (ref 0.3–1.2)
Total Protein: 6.8 g/dL (ref 6.0–8.3)

## 2014-05-08 LAB — CBC
HCT: 38 % — ABNORMAL LOW (ref 39.0–52.0)
Hemoglobin: 13.3 g/dL (ref 13.0–17.0)
MCH: 32.7 pg (ref 26.0–34.0)
MCHC: 35 g/dL (ref 30.0–36.0)
MCV: 93.4 fL (ref 78.0–100.0)
Platelets: 196 10*3/uL (ref 150–400)
RBC: 4.07 MIL/uL — ABNORMAL LOW (ref 4.22–5.81)
RDW: 13.8 % (ref 11.5–15.5)
WBC: 7.1 10*3/uL (ref 4.0–10.5)

## 2014-05-08 LAB — SALICYLATE LEVEL: Salicylate Lvl: <2 mg/dL — ABNORMAL LOW (ref 2.8–20.0)

## 2014-05-08 LAB — RAPID URINE DRUG SCREEN, HOSP PERFORMED
Amphetamines: NOT DETECTED
Barbiturates: NOT DETECTED
Benzodiazepines: POSITIVE — AB
Cocaine: NOT DETECTED
Opiates: NOT DETECTED
Tetrahydrocannabinol: NOT DETECTED

## 2014-05-08 LAB — ACETAMINOPHEN LEVEL: Acetaminophen (Tylenol), Serum: <15 ug/mL (ref 10–30)

## 2014-05-08 LAB — ETHANOL: Alcohol, Ethyl (B): <11 mg/dL (ref 0–11)

## 2014-05-08 MED ORDER — AMITRIPTYLINE HCL 50 MG PO TABS: 450.0000 mg | ORAL_TABLET | Freq: Every day | ORAL | Status: DC

## 2014-05-08 MED ORDER — PANTOPRAZOLE SODIUM 40 MG PO TBEC
40.0000 mg | DELAYED_RELEASE_TABLET | Freq: Every day | ORAL | Status: DC
  Administered 2014-05-08: 40 mg via ORAL
  Filled 2014-05-08: qty 1

## 2014-05-08 MED ORDER — METOPROLOL SUCCINATE ER 50 MG PO TB24
150.0000 mg | ORAL_TABLET | Freq: Every day | ORAL | Status: DC
  Administered 2014-05-08: 150 mg via ORAL
  Filled 2014-05-08: qty 1

## 2014-05-08 MED ORDER — PHENYTOIN 50 MG PO CHEW: 200.0000 mg | CHEWABLE_TABLET | Freq: Every day | ORAL | Status: DC

## 2014-05-08 MED ORDER — QUETIAPINE FUMARATE 200 MG PO TABS
800.0000 mg | ORAL_TABLET | Freq: Every day | ORAL | Status: DC
  Administered 2014-05-08 – 2014-05-12 (×5): 800 mg via ORAL
  Filled 2014-05-08 (×8): qty 4

## 2014-05-08 MED ORDER — ACETAMINOPHEN 325 MG PO TABS
650.0000 mg | ORAL_TABLET | ORAL | Status: DC | PRN
  Administered 2014-05-08: 650 mg via ORAL
  Filled 2014-05-08: qty 2

## 2014-05-08 MED ORDER — TESTOSTERONE CYPIONATE 200 MG/ML IM SOLN: 400.0000 mg | INTRAMUSCULAR | Status: DC

## 2014-05-08 MED ORDER — PHENYTOIN SODIUM EXTENDED 100 MG PO CAPS
200.0000 mg | ORAL_CAPSULE | Freq: Every day | ORAL | Status: DC
Start: 2014-05-08 — End: 2014-05-18
  Administered 2014-05-08 – 2014-05-16 (×9): 200 mg via ORAL
  Filled 2014-05-08 (×12): qty 2

## 2014-05-08 MED ORDER — POLYETHYLENE GLYCOL 3350 17 G PO PACK
17.0000 g | PACK | Freq: Every day | ORAL | Status: DC | PRN
Start: 2014-05-08 — End: 2014-05-08

## 2014-05-08 MED ORDER — LISINOPRIL 10 MG PO TABS: 5.0000 mg | ORAL_TABLET | Freq: Every day | ORAL | Status: DC | PRN

## 2014-05-08 MED ORDER — OLANZAPINE 5 MG PO TBDP: 5.0000 mg | ORAL_TABLET | Freq: Three times a day (TID) | ORAL | Status: DC | PRN

## 2014-05-08 MED ORDER — ALUM & MAG HYDROXIDE-SIMETH 200-200-20 MG/5ML PO SUSP
30.0000 mL | ORAL | Status: DC | PRN
Start: 2014-05-08 — End: 2014-05-18

## 2014-05-08 MED ORDER — CLONAZEPAM 1 MG PO TABS
5.0000 mg | ORAL_TABLET | Freq: Every day | ORAL | Status: DC
  Administered 2014-05-08: 5 mg via ORAL
  Filled 2014-05-08: qty 5

## 2014-05-08 MED ORDER — QUETIAPINE FUMARATE 200 MG PO TABS: 800.0000 mg | ORAL_TABLET | Freq: Every day | ORAL | Status: DC

## 2014-05-08 MED ORDER — ACETAMINOPHEN 325 MG PO TABS
650.0000 mg | ORAL_TABLET | Freq: Four times a day (QID) | ORAL | Status: DC | PRN
  Administered 2014-05-09 – 2014-05-12 (×3): 650 mg via ORAL
  Filled 2014-05-08 (×3): qty 2

## 2014-05-08 MED ORDER — ONDANSETRON HCL 4 MG PO TABS: 4.0000 mg | ORAL_TABLET | Freq: Three times a day (TID) | ORAL | Status: DC | PRN

## 2014-05-08 MED ORDER — ZOLPIDEM TARTRATE 5 MG PO TABS
5.0000 mg | ORAL_TABLET | Freq: Every evening | ORAL | Status: DC | PRN
  Administered 2014-05-08: 5 mg via ORAL
  Filled 2014-05-08: qty 1

## 2014-05-08 MED ORDER — ALUM & MAG HYDROXIDE-SIMETH 200-200-20 MG/5ML PO SUSP: 30.0000 mL | ORAL | Status: DC | PRN

## 2014-05-08 MED ORDER — SIMVASTATIN 40 MG PO TABS: 40.0000 mg | ORAL_TABLET | Freq: Every day | ORAL | Status: DC

## 2014-05-08 MED ORDER — MAGNESIUM HYDROXIDE 400 MG/5ML PO SUSP: 30.0000 mL | Freq: Every day | ORAL | Status: DC | PRN

## 2014-05-08 MED ORDER — AMITRIPTYLINE HCL 50 MG PO TABS
450.0000 mg | ORAL_TABLET | Freq: Every day | ORAL | Status: DC
  Administered 2014-05-08: 450 mg via ORAL
  Filled 2014-05-08 (×3): qty 1

## 2014-05-08 MED ORDER — IBUPROFEN 200 MG PO TABS
600.0000 mg | ORAL_TABLET | Freq: Three times a day (TID) | ORAL | Status: DC | PRN
  Administered 2014-05-08: 600 mg via ORAL
  Filled 2014-05-08 (×2): qty 1

## 2014-05-08 MED ORDER — SIMVASTATIN 40 MG PO TABS
40.0000 mg | ORAL_TABLET | Freq: Every day | ORAL | Status: DC
  Administered 2014-05-08 – 2014-05-17 (×10): 40 mg via ORAL
  Filled 2014-05-08 (×8): qty 1
  Filled 2014-05-08: qty 2
  Filled 2014-05-08 (×2): qty 1
  Filled 2014-05-08 (×2): qty 2
  Filled 2014-05-08: qty 1

## 2014-05-08 MED ORDER — METOPROLOL SUCCINATE ER 50 MG PO TB24
150.0000 mg | ORAL_TABLET | Freq: Every day | ORAL | Status: DC
  Administered 2014-05-08: 150 mg via ORAL
  Filled 2014-05-08 (×5): qty 1

## 2014-05-08 MED ORDER — TRAZODONE HCL 100 MG PO TABS: 100.0000 mg | ORAL_TABLET | Freq: Every evening | ORAL | Status: DC | PRN

## 2014-05-08 MED ORDER — CHLORDIAZEPOXIDE HCL 25 MG PO CAPS: 25.0000 mg | ORAL_CAPSULE | Freq: Four times a day (QID) | ORAL | Status: DC | PRN

## 2014-05-08 MED ORDER — CLONAZEPAM 0.5 MG PO TABS: 5.0000 mg | ORAL_TABLET | Freq: Every day | ORAL | Status: DC

## 2014-05-08 NOTE — Progress Notes (Signed)
Patient ID: [name], male   DOB: 1956-09-27, 58 y.o.   MRN: [id number] Admit Note. Pt presents to [facility] Adult unit for voluntary admission. Pt reports long history of depression, and anxiety. Pt states he has significant stressors increasing his depression including financial difficulties, and legal trouble with his brother. He states '' My brother is trying to get me evicted out of my condo so that he can have more money. There is a trust set up for me from when our parents died. He would force me to be on the streets and I can't live like that, homeless, I'd rather be dead '' Patient reports that he feels numb, lonely and empty. Upon admission he denies any SI plan, denies AH/VH. He is able to contract for safety. He reports very poor sleep x 2 days. Pt speech upon admission is tangential, and patient is difficult to redirect at times. Pt oriented to unit. Moderate Fall Risk. Skin searched and no contraband found, no skin issues noted. Meal provided, and gatorade and ice water given. Pt in no acute distress at this time. Will continue to monitor q 15 minutes for safety.

## 2014-05-08 NOTE — ED Provider Notes (Signed)
  Physical Exam  BP 120/70  Pulse 90  Temp(Src) 98.7 F (37.1 C) (Oral)  Resp 20  Ht 5\' 11"  (1.803 m)  Wt 190 lb (86.183 kg)  BMI 26.51 kg/m2  SpO2 96%  Physical Exam  ED Course  Procedures  Patient accepted to [facility] under Dr. Darleene Cleaver, stable for transfer.      Wandra Arthurs, MD 05/08/14 [phone]

## 2014-05-08 NOTE — ED Notes (Signed)
Patient asked for and received a cup of ice.

## 2014-05-08 NOTE — Progress Notes (Signed)
Pt did not attend group this afternoon.   

## 2014-05-08 NOTE — ED Notes (Signed)
Pt refuses all lab work. He states, "THey can involuntary commit me. I won't do it"

## 2014-05-08 NOTE — Progress Notes (Signed)
Patient ID: [name], male   DOB: [birth year], 58 y.o.   MRN: [id number]

## 2014-05-08 NOTE — Progress Notes (Signed)
Patient ID: [name], male   DOB: [birth year], 58 y.o.   MRN: [id number] Psychoeducational Group Note  Date:  05/08/2014 Time:0930am  Group Topic/Focus:  Identifying Needs:   The focus of this group is to help patients identify their personal needs that have been historically problematic and identify healthy behaviors to address their needs.  Participation Level:  Did Not Attend  Participation Quality:    Affect:  Cognitive:  Insight:  Engagement in Group:  Additional Comments:  Did not attend- late admission   Pricilla Larsson 05/08/2014,5:37 PM

## 2014-05-08 NOTE — Tx Team (Signed)
Initial Interdisciplinary Treatment Plan  PATIENT STRENGTHS: (choose at least two) Ability for insight Average or above average intelligence Capable of independent living Communication skills General fund of knowledge Motivation for treatment/growth  PATIENT STRESSORS: Health problems Marital or family conflict   PROBLEM LIST: Problem List/Patient Goals Date to be addressed Date deferred Reason deferred Estimated date of resolution  depression 05-08-14   dc  Suicide risk 05-08-14   dc                                             DISCHARGE CRITERIA:  Ability to meet basic life and health needs Improved stabilization in mood, thinking, and/or behavior Motivation to continue treatment in a less acute level of care Reduction of life-threatening or endangering symptoms to within safe limits Verbal commitment to aftercare and medication compliance  PRELIMINARY DISCHARGE PLAN: Attend aftercare/continuing care group Outpatient therapy Return to previous living arrangement  PATIENT/FAMIILY INVOLVEMENT: This treatment plan has been presented to and reviewed with the patient, [name], and/or family member, .  The patient and family have been given the opportunity to ask questions and make suggestions.  [name] 05/08/2014, 1:58 PM

## 2014-05-08 NOTE — [facility] Group Notes (Signed)
[facility] Group Notes: (Clinical Social Work)   05/08/2014      Type of Therapy:  Group Therapy   Participation Level:  Did Not Attend    Selmer Dominion, LCSW 05/08/2014, 12:55 PM

## 2014-05-08 NOTE — Progress Notes (Signed)
[facility] Group Notes:  (Nursing/MHT/Case Management/Adjunct)  Date:  05/08/2014  Time:  8:00 p.m.   Type of Therapy:  Psychoeducational Skills  Participation Level:  Active  Participation Quality:  Appropriate  Affect:  Depressed  Cognitive:  Appropriate  Insight:  Appropriate  Engagement in Group:  Engaged  Modes of Intervention:  Education  Summary of Progress/Problems: The patient expressed in group this evening that he didn't have a very good day since he felt "exhausted". The patient states that he hasn't slept in two days and thus spent a great deal of time simply laying in bed. He also mentioned that he is concerned about his medication in the sense that if the medication is given to him during the day, then he will feel drowsy at that time versus taking the medication at night to help him sleep. In terms of the theme for the day, his coping skill is to use his home computer.   Gennette Pac 05/08/2014, 11:37 PM

## 2014-05-08 NOTE — BH Assessment (Signed)
Received call for assessment. Spoke to Dr. Noemi Chapel who said Pt is depressed and dealing with inheritance issues after his parents' deaths. He reports suicidal ideation. Spoke to [employer], RN who said Pt is being transferred to room C20 and tele-cart will be ready in approximately 15 minutes.  Orpah Greek Rosana Hoes, [facility] Triage Specialist [phone]

## 2014-05-08 NOTE — BH Assessment (Signed)
Assessment complete. Clayborne Dana, [facility], confirmed adult unit is currently at capacity. Gave clinical report to Serena Colonel, NP who agrees Pt meets criteria for inpatient psychiatric treatment. TTS will contact other facilities. Notified Dr. Noemi Chapel and Birder Robson, RN of recommendation.  Orpah Greek Rosana Hoes, [facility] Triage Specialist [phone]
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
## 2014-05-08 NOTE — BH Assessment (Signed)
[facility] is currently at capacity. Contacted the following facilities for placement:   [facility]: At capacity per Margaret.  [facility]: At capacity per Nataki.  [facility]: At capacity per Tracy.  [facility]: At capacity per Nikki.  [facility]: At capacity per Natasha.  [facility]: At capacity per David.  [facility]: At capacity per Jean.  [facility]: At capacity per Victoria.  [facility]: At capacity per Jay.   [facility]: Left voicemail.  [facility]: Beds available. Faxed clinical information.  [facility]: Beds available. Faxed clinical information.  [facility]: Beds available. Faxed clinical  [facility]: Beds available. Faxed clinical information.   [facility]: Pt is declined.    [name], LPC, NCC  Triage Specialist  [phone]  

## 2014-05-08 NOTE — Progress Notes (Signed)
Patient ID: [name], male   DOB: 17-Nov-1956, 58 y.o.   MRN: [id number] Psychoeducational Group Note  Date:  05/08/2014 Time: 0910  Group Topic/Focus:  inventory group- did not attend-late admission  Participation Level: Did Not Attend  Participation Quality:  Not Applicable  Affect:  Not Applicable  Cognitive:  Not Applicable  Insight:  Not Applicable  Engagement in Group: Not Applicable  Additional Comments:  Did not attend. Late admission   Pricilla Larsson 05/08/2014, 5:36 PM
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
## 2014-05-09 DIAGNOSIS — R45851 Suicidal ideations: Secondary | ICD-10-CM

## 2014-05-09 MED ORDER — METOPROLOL SUCCINATE ER 100 MG PO TB24
100.0000 mg | ORAL_TABLET | Freq: Every day | ORAL | Status: DC
  Administered 2014-05-09 – 2014-05-14 (×6): 100 mg via ORAL
  Filled 2014-05-09 (×8): qty 1

## 2014-05-09 MED ORDER — CLONAZEPAM 1 MG PO TABS
2.0000 mg | ORAL_TABLET | Freq: Once | ORAL | Status: AC
  Administered 2014-05-09: 2 mg via ORAL
  Filled 2014-05-09: qty 2

## 2014-05-09 MED ORDER — AMITRIPTYLINE HCL 75 MG PO TABS
150.0000 mg | ORAL_TABLET | Freq: Every day | ORAL | Status: DC
  Administered 2014-05-09 – 2014-05-12 (×4): 150 mg via ORAL
  Filled 2014-05-09 (×5): qty 2

## 2014-05-09 MED ORDER — CLONAZEPAM 1 MG PO TABS
1.0000 mg | ORAL_TABLET | Freq: Two times a day (BID) | ORAL | Status: DC
  Administered 2014-05-10 – 2014-05-17 (×15): 1 mg via ORAL
  Filled 2014-05-09 (×18): qty 1

## 2014-05-09 NOTE — Progress Notes (Signed)
Patient ID: [name], male   DOB: August 18, 1956, 58 y.o.   MRN: [id number] D. The patient is depressed with an anxious affect. He was very concerned because many of his home medications were not ordered. Darlyne Russian, PA as the extender on call was notified and order the home medications. Due to the large doses of these medications, [facility] was called to verify if these medications were within a safe therapeutic dosage. The pharmacy verified that the medication dosages did not exceed the therapeutic safe dosing range. [facility] was called to verify that these were the medications and doses he was prescribed prior to admission. A. Met with patient to assess. Encouraged to attend evening group. Home  Medications reviewed with patient. Administered HS medications. R. The patient denied suicidal ideation. Verbally contracted for safety. Even with high dosage of HS medications, the patient had difficulty settling and falling asleep. Will continue to monitor.

## 2014-05-09 NOTE — [facility] Group Notes (Signed)
[place] Group Notes: (Clinical Social Work)   05/09/2014      Type of Therapy:  Group Therapy   Participation Level:  Did Not Attend    Selmer Dominion, LCSW 05/09/2014, 12:18 PM

## 2014-05-09 NOTE — Progress Notes (Signed)
Patient ID: [name], male   DOB: 05/14/1956, 58 y.o.   MRN: [id number] Psychoeducational Group Note  Date:  05/09/2014 Time:  0910  Group Topic/Focus:  inventory group   Participation Level: Did Not Attend  Participation Quality:  Not Applicable  Affect:  Not Applicable  Cognitive:  Not Applicable  Insight:  Not Applicable  Engagement in Group: Not Applicable  Additional Comments:  Did not attend.   Pricilla Larsson 05/09/2014, 10:07 AM

## 2014-05-09 NOTE — Progress Notes (Signed)
Patient ID: [name], male   DOB: 05/19/1956, 58 y.o.   MRN: [id number] Attempt made to wake patient to complete PSA update following NP attempt to speak with patient. Will attempt later in the day if work flow permits as NP reports patient had large doses of nighttime medication; she was unable to access due to patient's slurred speech.  Sheilah Pigeon, LCSW

## 2014-05-09 NOTE — Progress Notes (Signed)
[facility] Group Notes:  (Nursing/MHT/Case Management/Adjunct)  Date:  05/09/2014  Time:  11:43 PM  Type of Therapy:  Psychoeducational Skills  Participation Level:  Active  Participation Quality:  Appropriate  Affect:  Anxious  Cognitive:  Appropriate  Insight:  Good  Engagement in Group:  Engaged  Modes of Intervention:  Education  Summary of Progress/Problems: The patient shared in group that he had a better day. The patient attributed his good day to being able to rest during the daytime. He was grateful for being able to rest since he states that he had not rested in three days. As a theme for the day, his support system will come from the ACT Team if he can access their services. The patient would like to receive services from the ACT Team since he has forgotten a number of his appointments and fears that his doctors will drop him from their roster. The patient has reliable transportation and lives near a pharmacy as well as the therapist's office. He also admitted to remaining alone in his house for days at a time and will also remain in his bed for long periods of time.   Gennette Pac 05/09/2014, 11:43 PM

## 2014-05-09 NOTE — Progress Notes (Signed)
Patient ID: [name], male   DOB: 01-26-1956, 58 y.o.   MRN: [id number] D. Patient has been in bed resting quietly throughout most of shift, he was unable to be awoken in the am. Pt this afternoon continues to report feeling depressed, stating '' I'm glad that you let me sleep because I hadn't been asleep in days and I finally feel like I got some rest. '' Patient cooperative on the unit. He requests that he program on [address] as he states ''that's where I was the last time I was here, and it helped me feel better. '' A. Support and encouragement provided. Discussed above information with L. Rosana Hoes NP. Pt later complained of dizziness upon standing, VS taken and noted orthostatic hypotension. Po fluids given, pitcher of gatorade and water encouraged. Pt also educated to stand slowly and fall safety measures reviewed with patient. Notified Conrad NP of above information and orders received. R. Patient currently in the dayroom , eating dinner, in no acute distress.

## 2014-05-09 NOTE — [facility] Suicide Risk Assessment (Signed)
Suicide Risk Assessment  Admission Assessment     Nursing information obtained from:  Patient Demographic factors:  Male;Caucasian;Living alone;Unemployed Current Mental Status:  NA (denies si/hi/av. ) Loss Factors:  NA Historical Factors:  Family history of suicide;Family history of mental illness or substance abuse Risk Reduction Factors:  Religious beliefs about death;Positive social support Total Time spent with patient: 45 minutes  CLINICAL FACTORS: Schizoaffective Disorder    Psychiatric Specialty Exam:     Blood pressure 104/71, pulse 81, temperature 96.8 F (36 C), temperature source Oral, resp. rate 20, height 5\' 11"  (1.803 m), weight 87.544 kg (193 lb), SpO2 100.00%.Body mass index is 26.93 kg/(m^2).  General Appearance: Disheveled  Eye Contact::  Minimal  Speech:  Slurred  Volume:  Decreased  Mood:  tired  Affect:  Restricted  Thought Process:  Coherent and Goal Directed  Orientation:  Other:  unable to evaluate  Thought Content:  not spontaneous content  Suicidal Thoughts:  No  Homicidal Thoughts:  No  Memory:  NA  Judgement:  Fair  Insight:  Lacking  Psychomotor Activity:  Decreased  Concentration:  Fair  Recall:  Poor  Fund of Knowledge:NA  Language: Fair  Akathisia:  No  Handed:    AIMS (if indicated):     Assets:  Desire for Improvement  Sleep:  Number of Hours: 2.5   Musculoskeletal: Strength & Muscle Tone: within normal limits Gait & Station: normal Patient leans: N/A  COGNITIVE FEATURES THAT CONTRIBUTE TO RISK: Unable to evaluate    SUICIDE RISK:   Moderate:  Frequent suicidal ideation with limited intensity, and duration, some specificity in terms of plans, no associated intent, good self-control, limited dysphoria/symptomatology, some risk factors present, and identifiable protective factors, including available and accessible social support.  PLAN OF CARE: Supportive approach/coping skills                              Reassess medications  Optimize response with the lowest effective dosage  I certify that inpatient services furnished can reasonably be expected to improve the patient's condition.  Nicholaus Bloom 05/09/2014, 4:49 PM
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
## 2014-05-09 NOTE — Progress Notes (Signed)
Patient ID: Kailon Treese, male   DOB: January 11, 1956, 58 y.o.   MRN: [id number] Psychoeducational Group Note  Date:  05/09/2014 Time:  0930  Group Topic/Focus:  healthy support systems.   Participation Level: Did Not Attend  Participation Quality:  Not Applicable  Affect:  Not Applicable  Cognitive:  Not Applicable  Insight:  Not Applicable  Engagement in Group: Not Applicable  Additional Comments:  Did not attend.   Pricilla Larsson 05/09/2014, 10:07 AM

## 2014-05-09 NOTE — Progress Notes (Signed)
Patient ID: [name], male   DOB: Sep 22, 1956, 58 y.o.   MRN: [id number] D. The patient has a depressed mood and anxious affect. Reports that he is feeling better because he finally slept after being awake for almost three days. He was grateful that the day shift let him sleep to catch up on his much needed rest. He stated that although he has transportation to get to doctor appointments he frequently misses them because he forgets to go to the appointment. Asked if he was eligible for ACT Team services to help him get to his doctor appointments. A. Referred to Education officer, museum and encouraged to attend discharge planning group in the morning to discuss his concerns and to find out what services are available to aid his recovery and prevent relapse. HS medications reviewed with the patient. Administered HS medications. R. The patient was concerned about medication changes. Requested to sign a 72 hour release form. Denied any suicidal/homcidal ideation and is able to verbally contract for safety.Denied any a/v hallucinations.

## 2014-05-10 DIAGNOSIS — F4321 Adjustment disorder with depressed mood: Secondary | ICD-10-CM

## 2014-05-10 DIAGNOSIS — F1320 Sedative, hypnotic or anxiolytic dependence, uncomplicated: Secondary | ICD-10-CM

## 2014-05-10 DIAGNOSIS — F259 Schizoaffective disorder, unspecified: Principal | ICD-10-CM

## 2014-05-10 DIAGNOSIS — F1120 Opioid dependence, uncomplicated: Secondary | ICD-10-CM

## 2014-05-10 NOTE — Progress Notes (Signed)
Patient ID: [name], male   DOB: April 02, 1956, 58 y.o.   MRN: [id number]  Patient transferred to Bed 504-01 per Priscille Loveless FNP.

## 2014-05-10 NOTE — Tx Team (Signed)
  Interdisciplinary Treatment Plan Update   Date Reviewed:  05/10/2014  Time Reviewed:  3:32 PM  Progress in Treatment:   Attending groups: Yes Participating in groups: Yes Taking medication as prescribed: Yes  Tolerating medication: Yes Family/Significant other contact made: No  Patient understands diagnosis: Yes AEB asking for help with depression, SI Discussing patient identified problems/goals with staff: Yes  See initial care plan Medical problems stabilized or resolved: Yes Denies suicidal/homicidal ideation: Yes  In tx team Patient has not harmed self or others: Yes  For review of initial/current patient goals, please see plan of care.  Estimated Length of Stay:  3-5 days  Reason for Continuation of Hospitalization: Depression Medication stabilization Suicidal ideation  New Problems/Goals identified:  N/A  Discharge Plan or Barriers:   return home, follow up outpt  Additional Comments:  [name] is an 58 y.o. male, single, Caucasian who presents unaccompanied to [facility] ED reporting symptoms of depression. Pt has a long history of depression and is currently receiving outpatient medication management with Dr. Launa Grill. Pt states he has been stressed recently because his brother is trying to alter the terms of a trust left by his parents, who both died approximately two years ago   Attendees:  Signature: Corena Pilgrim, MD 05/10/2014 3:32 PM   Signature: Ripley Fraise, LCSW 05/10/2014 3:32 PM  Signature: Elmarie Shiley, NP 05/10/2014 3:32 PM  Signature: Mayra Neer, RN 05/10/2014 3:32 PM  Signature: Darrol Angel, RN 05/10/2014 3:32 PM  Signature:  05/10/2014 3:32 PM  Signature:   05/10/2014 3:32 PM  Signature:    Signature:    Signature:    Signature:    Signature:    Signature:      Scribe for Treatment Team:   Ripley Fraise, LCSW  05/10/2014 3:32 PM

## 2014-05-10 NOTE — Progress Notes (Signed)
Patient ID: [name], male   DOB: [birth year], 58 y.o.   MRN: [id number] D. The patient came to staff and said he felt like when he stood up he was going to pass out. Orthostatic blood pressures taken. Sitting BP112/73 Pulse 88. Standing BP 76/49.  A. Given a pitcher of gator aid. Encouraged to stay in bed and ring bell for assistance. Fall risk plan reviewed with patient. Yellow arm band ppt on patient and fall risk stick placed on door and check sheet. R. The patient is resting in bed. Agreed to following the fall risk plan for safety.

## 2014-05-10 NOTE — Progress Notes (Signed)
[facility] MD Progress Note  05/10/2014 2:43 PM [name]  MRN:  [id number] Subjective:  [name] is an 58 y.o. male, single, Caucasian who presents unaccompanied to [facility] reporting symptoms of depression. Pt stated on admission that he has been stressed recently because his brother is trying to alter the terms of a trust left by his parents, who both died approximately two years ago. Pt fears his brother will force him to leave his parents home and he will be homeless. Pt states cannot survive should he become homeless. Pt reports he feels his situation is hopeless.   Patient is still insisting on increasing his medications. He is requesting his Doxepin. States the [employer] don't work and TCA give him seizures. Notes from last night indicate that patient was demanding to have his medications reinstated even though they were at very high dosages. His pharmacy was contacted to verify the medications but it appears he is unable to tolerate the dosages.   Upon today's assessment patient states"I am not going to good. I got this depression. I feel sleep deprived, I only slept a couple of hours in the last 48 hours. They put me on this Elavil, I was able to sleep. So yesterday I was really sedated when the doctors were trying to interview me yesterday. I really need to go back on my medication I was on before. Before I came here I was thinking about jumping off a bridge, but that was too painful. So I then though about shooting myself in the head or using my pistol grip shotgun in my mouth.. My uncle did that and he died but that was also painful. I don't know how Delton See didn't suffocate himself, because when he hung himself it didn't snap his neck. I have been researching it on the Internet and the most peaceful way to die is helium inhalation. I don't know why a lot of people don't know about it, but it is all over the Internet. You take canisters of helium, cut the valve off connect it to a tube  via a gas mask, and instead of breathing in oxygen you are breathing in pure helium." The patient has a history of cutting off his testicles 15 years ago to alleviate feelings of loneliness. Patient currently rates his depression 8/10, and anxiety 6/7-10. Denies SI/HI at this time, however he states not at this moment or when I get out the hospital but the fact I now have a way to kill myself is good.    Diagnosis:   DSM5: Schizophrenia Disorders:  Schizophrenia (295.7) Obsessive-Compulsive Disorders:   Trauma-Stressor Disorders:  Adjustment Disorder with Depressed Mood (308.03) Substance/Addictive Disorders:   Depressive Disorders:  Major Depressive Disorder - Severe (296.23) Total Time spent with patient: 30 minutes  Axis I: Adjustment Disorder with Depressed Mood, Major Depression, Recurrent severe, Schizoaffective Disorder and Benzodiazepine dependence, and opiate dependence Axis II: Deferred Axis III:  Past Medical History  Diagnosis Date  . Seizures   . Hypertension   . GERD (gastroesophageal reflux disease)   . Dyslipidemia   . Schizoaffective disorder   . Depression   . Anxiety   . MRSA carrier    Axis IV: economic problems, housing problems, other psychosocial or environmental problems, problems with access to health care services and problems with primary support group Axis V: 21-30 behavior considerably influenced by delusions or hallucinations OR serious impairment in judgment, communication OR inability to function in almost all areas  ADL's:  Intact  Sleep: Improving with medication  Appetite:  Fair  Suicidal Ideation:  Plan:  peaceful-helium inhalation via gas mask Intent:  carry out the plan, not at this time but he feels good knowing he has a peaceful way to die Means:  Access to items above Homicidal Ideation:  Plan:  Denies Intent:  Denies Means:  Denies AEB (as evidenced by):  Psychiatric Specialty Exam: Physical Exam  Constitutional: He is oriented  to person, place, and time.  HENT:  Head: Normocephalic.  Neck: Normal range of motion.  Musculoskeletal: Normal range of motion.  Neurological: He is alert and oriented to person, place, and time.  Skin: Skin is warm and dry.    Review of Systems  Psychiatric/Behavioral: Positive for depression and suicidal ideas. Negative for hallucinations and substance abuse. The patient is nervous/anxious and has insomnia.   All other systems reviewed and are negative.   Blood pressure 116/77, pulse 84, temperature 97 F (36.1 C), temperature source Oral, resp. rate 18, height 5\' 11"  (1.803 m), weight 87.544 kg (193 lb), SpO2 100.00%.Body mass index is 26.93 kg/(m^2).  General Appearance: Fairly Groomed  Engineer, water::  Fair  Speech:  Clear and Coherent and Normal Rate  Volume:  Normal  Mood:  Depressed, Hopeless and Worthless  Affect:  Constricted, Depressed and Flat  Thought Process:  Circumstantial  Orientation:  Full (Time, Place, and Person)  Thought Content:  WDL  Suicidal Thoughts:  Yes.  with intent/plan  Homicidal Thoughts:  No  Memory:  Immediate;   Good Recent;   Fair Remote;   Fair  Judgement:  Fair  Insight:  Fair  Psychomotor Activity:  Normal  Concentration:  Fair  Recall:  Good  Fund of Knowledge:Good  Language: Fair  Akathisia:  No  Handed:  Right  AIMS (if indicated):     Assets:  Communication Skills Desire for Improvement Financial Resources/Insurance Housing Transportation  Sleep:  Number of Hours: 2   Musculoskeletal: Strength & Muscle Tone: within normal limits Gait & Station: normal Patient leans: N/A  Current Medications: Current Facility-Administered Medications  Medication Dose Route Frequency Provider Last Rate Last Dose  . acetaminophen (TYLENOL) tablet 650 mg  650 mg Oral Q6H PRN Elmarie Shiley, NP   650 mg at 05/09/14 0328  . alum & mag hydroxide-simeth (MAALOX/MYLANTA) 200-200-20 MG/5ML suspension 30 mL  30 mL Oral Q4H PRN Elmarie Shiley, NP      .  amitriptyline (ELAVIL) tablet 150 mg  150 mg Oral QHS Elmarie Shiley, NP   150 mg at 05/09/14 2305  . clonazePAM (KLONOPIN) tablet 1 mg  1 mg Oral BID Elmarie Shiley, NP      . magnesium hydroxide (MILK OF MAGNESIA) suspension 30 mL  30 mL Oral Daily PRN Elmarie Shiley, NP      . metoprolol succinate (TOPROL-XL) 24 hr tablet 100 mg  100 mg Oral QHS Benjamine Mola, FNP   100 mg at 05/09/14 2304  . OLANZapine zydis (ZYPREXA) disintegrating tablet 5 mg  5 mg Oral Q8H PRN Elmarie Shiley, NP      . phenytoin (DILANTIN) ER capsule 200 mg  200 mg Oral QHS Elmarie Shiley, NP   200 mg at 05/09/14 2301  . QUEtiapine (SEROQUEL) tablet 800 mg  800 mg Oral QHS Dara Hoyer, PA-C   800 mg at 05/09/14 2302  . simvastatin (ZOCOR) tablet 40 mg  40 mg Oral q1800 Elmarie Shiley, NP   40 mg at 05/09/14 2303  . traZODone (DESYREL)  tablet 100 mg  100 mg Oral QHS PRN Elmarie Shiley, NP        Lab Results: No results found for this or any previous visit (from the past 48 hour(s)).  Physical Findings: AIMS: Facial and Oral Movements Muscles of Facial Expression: None, normal Lips and Perioral Area: None, normal Jaw: None, normal Tongue: None, normal,Extremity Movements Upper (arms, wrists, hands, fingers): None, normal Lower (legs, knees, ankles, toes): None, normal, Trunk Movements Neck, shoulders, hips: None, normal, Overall Severity Severity of abnormal movements (highest score from questions above): None, normal Incapacitation due to abnormal movements: None, normal Patient's awareness of abnormal movements (rate only patient's report): No Awareness, Dental Status Current problems with teeth and/or dentures?: No Does patient usually wear dentures?: No  CIWA:    COWS:     Treatment Plan Summary: Daily contact with patient to assess and evaluate symptoms and progress in treatment Medication management  Plan:Treatment Plan/Recommendations:  1 Admit for crisis management and stabilization. Estimated length of stay 5-7 days  past his current stay of 1.  2 Individual and group therapy. 3 Medication management for depression, and anxiety to reduce current symptoms to base line and improve the overall levels of functioning: Medications reviewed with the patient and she stated no untoward effects, home medications in place.  4 Coping skills for depression and anxiety developing.  5 Continue crisis stabilization and management.  6 Address health issues- monitor vital signs, stable. 7 Treatment plan in progress to prevent relapse prevention and self care.  8 Psychosocial education regarding relapse prevention and self care 9 Heath care follow up as needed for any health concerns 10 Call for consult with hospitalist for additional specialty patient services as needed.   Medical Decision Making Problem Points:  Established problem, stable/improving (1), Review of last therapy session (1) and Review of psycho-social stressors (1) Data Points:  Discuss tests with performing physician (1) Review or order clinical lab tests (1) Review or order medicine tests (1) Review and summation of old records (2) Review of medication regiment & side effects (2) Review of new medications or change in dosage (2)  I certify that inpatient services furnished can reasonably be expected to improve the patient's condition.   Nanci Pina 05/10/2014, 2:43 PM

## 2014-05-10 NOTE — Progress Notes (Signed)
D: Patient resting in bed with eyes closed.  Respirations even and unlabored.  Patient appears to be in no apparent distress. A: Staff to monitor Q 15 mins for safety.   R:Patient remains safe on the unit.  

## 2014-05-10 NOTE — BHH Counselor (Signed)
Adult Psychosocial Assessment Update Interdisciplinary Team  Previous [facility] admissions/discharges:  Admissions Discharges  Date:  01/21/14 Date:  Date: Date:  Date: Date:  Date: Date:  Date: Date:   Changes since the last Psychosocial Assessment (including adherence to outpatient mental health and/or substance abuse treatment, situational issues contributing to decompensation and/or relapse). [name] states he did well after he left here in Feb. for a few months.  Said he wanted to get back on Elavil, so the Dr prescribed it for him, and his depression increased.  There were several subsequent switches, with no success, and he returned to [place] so that he could get back on the meds that help him.  He states he has no supports, and asked if it was possible for him to be seen in his home, or at least get a call from someone reminding him to go to Dr appointments.  Last time he left he was not on benzos.  He did not mention them by name, but his UDS was postitive for benzos, and he is being prescribed them here, along with Elavil and Seroquel.             Discharge Plan 1. Will you be returning to the same living situation after discharge?   Yes:X  home No:      If no, what is your plan?           2. Would you like a referral for services when you are discharged? Yes: X    If yes, for what services?  No:       Sees Dr Reece Levy at [facility].  Is interested in a referral to [facility].       Summary and Recommendations (to be completed by the evaluator) [name] is a 58 YO Caucasian male who presents as depressed and anxious.  He signed a 72 hr request on 5/24, which makes him eligible for d/c on Wednesday.  He can benefit from crises stabilization, medication management, therapeutic milieu and referral for services.                       Signature:  Trish Mage, 05/10/2014 3:21 PM

## 2014-05-10 NOTE — Progress Notes (Signed)
Adult Psychoeducational Group Note  Date:  05/10/2014 Time:  9:28 PM  Group Topic/Focus:  Wrap-Up Group:   The focus of this group is to help patients review their daily goal of treatment and discuss progress on daily workbooks.  Participation Level:  Did Not Attend  PAdditional Comments: pt ddi not attend group, when prompted he stated that he did not feel up to it  Kamile Fassler 05/10/2014, 9:28 PM

## 2014-05-10 NOTE — Progress Notes (Signed)
Patient ID: [name], male   DOB: 12-25-1955, 58 y.o.   MRN: [id number]  D: Pt. Denies HI and A/V Hallucinations. Patient endorses passive SI but contracts for safety. Patient rates his depression and hopelessness at 8/10 for the day. Per report last night patient had thoughts to end his life through Helium. Patient reports no plan this morning. Patient does not report any pain or discomfort at this time.   A: Support and encouragement provided to the patient. Scheduled Klonopin was offered to patient this morning per physician's orders but patient refused stating, "I usually take that at night." Patient is concerned about his current medication changes. That has been expressed to NP by Probation officer.  R: Patient is receptive and cooperative. Patient is seen in the milieu and speaking with other patients and staff. Q15 minute checks are maintained for safety.

## 2014-05-10 NOTE — Progress Notes (Signed)
D: Pt denies SI/HI/AVH. Pt is pleasant and cooperative. Pt is labile with his speech at times, pt stated things were hopless and stated he has increased insomnia and depression since being admitted.   A: Pt was offered support and encouragement. Pt was given scheduled medications. Pt was encourage to attend groups. Q 15 minute checks were done for safety.   R: Pt is taking medication. Pt has no complaints.Pt receptive to treatment and safety maintained on unit.

## 2014-05-10 NOTE — Progress Notes (Signed)
The patient has been awake all night up until approximately thirty minutes ago. On at least 3 or 4 occasions, the patient had approached this author to talk. The patient insisted that he would not be able to sleep this evening due to the changes with his medication. On one particular occasion, the patient spoke at length about inhaling helium as a means of committing suicide. He stated that it would be a more peaceful way of dying as opposed to jumping off of a bridge or overdosing on medication. He stated that overdosing on medication would not be a good option for him since he had too much respect for medication since they are designed to help people while ending his life violently would be too painful. This author asked the patient if he thought about suicide every so often or if he thought about it all of the time. The patient indicated that he thought about suicide every day.

## 2014-05-11 ENCOUNTER — Other Ambulatory Visit: Payer: Self-pay | Admitting: Emergency Medicine

## 2014-05-11 MED ORDER — OLANZAPINE 10 MG PO TBDP
10.0000 mg | ORAL_TABLET | Freq: Three times a day (TID) | ORAL | Status: DC | PRN
  Administered 2014-05-12: 10 mg via ORAL
  Filled 2014-05-11: qty 1

## 2014-05-11 NOTE — Progress Notes (Signed)
The focus of this group is to educate the patient on the purpose and policies of crisis stabilization and provide a format to answer questions about their admission.  The group details unit policies and expectations of patients while admitted.  Patient did not attend 0900 nurse education orientation group this morning.  Patient stayed in his room, sitting on bed, walking in room.

## 2014-05-11 NOTE — BHH Suicide Risk Assessment (Signed)
[facility] INPATIENT:  Family/Significant Other Suicide Prevention Education  Suicide Prevention Education:  Patient Refusal for Family/Significant Other Suicide Prevention Education: The patient [name] has refused to provide written consent for family/significant other to be provided Family/Significant Other Suicide Prevention Education during admission and/or prior to discharge.  Physician notified.  Patient advised of no family involvement.  Tonianne Fine Hairston Kelson Queenan 05/11/2014, 11:26 AM

## 2014-05-11 NOTE — Progress Notes (Signed)
Recreation Therapy Notes  Animal-Assisted Activity/Therapy (AAA/T) Program Checklist/Progress Notes Patient Eligibility Criteria Checklist & Daily Group note for Rec Tx Intervention  Date: 05.26.2015 Time: 2:45pm Location: [address]    AAA/T Program Assumption of Risk Form signed by Patient/ or Parent Legal Guardian yes  Patient is free of allergies or sever asthma yes  Patient reports no fear of animals yes  Patient reports no history of cruelty to animals yes   Patient understands his/her participation is voluntary yes  Patient washes hands before animal contact yes  Patient washes hands after animal contact yes  Behavioral Response: Appropriate.  Education: Contractor, Pensions consultant   Education Outcome: Acknowledges understanding  Clinical Observations/Feedback: Patient interacted appropriately in AAA session, petting therapy dog appropriately and interaction with peers appropriately. Patient was observed to have bright affect during session, often smiling and laughing with peers.   Laureen Ochs Reneta Niehaus, LRT/CTRS  Laureen Ochs Loney Peto 05/11/2014 4:50 PM

## 2014-05-11 NOTE — BHH Group Notes (Signed)
[facility] LCSW Group Therapy  05/11/2014 2:43 PM  Type of Therapy:  Group Therapy  Participation Level:  Did Not Attend   [name] 05/11/2014, 2:43 PM

## 2014-05-11 NOTE — Progress Notes (Signed)
D:  Patient's self inventory sheet, patient needs sleep medications, has fair sleep.  Poor appetite, low energy level, poor attention span.  Rated depression and hopeless 8.5, anxiety 8.  Denied withdrawals.  Denied SI.  Has felt pain in past 24 hours.  Zero pain goal.  "Stay in bed almost 24/7.  I think if I improve I could join the [facility].  But its been impossible to get the motivation. I would like to start getting treatment so I can feel good enough to be more functional.  I am feeling a phobia of speaking to someone anyone so I am staying in my bed.  I feel hopeless but also don't have anything to hope for."  No problems with medications after discharge. A:  Medications administered per MD orders.  Emotional support and encouragement given patient. R:  Denied SI and HI.  Contracts for safety.  Denied A/V hallucinations.  Will continue to monitor patient for safety with 15 minute checks.  Safety maintained.

## 2014-05-11 NOTE — Progress Notes (Signed)
[facility] MD Progress Note  05/11/2014 2:05 PM [name]  MRN:  [id number] Subjective:  Patient has been diagnosed with schizoaffective disorder, he was seen lying on his bed, isolated, depressed and has disorganized thoughts and paranoia. He has bee feel sleep deprived, and slept only few hours last night. He has been asking to go on his medication that he was on before in Feb admission. Before I came here I was thinking about jumping off a bridge, but that was too painful. So I then though about shooting myself in the head or using my pistol grip shotgun in my mouth. He has family history of completed suicide reportedly stated his uncle shot himself. He stated that he as been researching it on the Internet and the most peaceful way to die is helium inhalation. Patient currently rates his depression 8/10, and anxiety 6/7-10. Denies SI/HI at this time, however he states not at this moment or when I get out the hospital but the fact I now have a way to kill myself is good.  Diagnosis:   DSM5: Schizophrenia Disorders:  Schizophrenia (295.7) Obsessive-Compulsive Disorders:   Trauma-Stressor Disorders:   Substance/Addictive Disorders:   Depressive Disorders:   Total Time spent with patient: 30 minutes  Axis I: Schizoaffective Disorder  ADL's:  Impaired  Sleep: Fair  Appetite:  Fair  Suicidal Ideation:  Has endorses suicidal ideation and contract for safety Homicidal Ideation:  denied AEB (as evidenced by):  Psychiatric Specialty Exam: Physical Exam  ROS  Blood pressure 105/74, pulse 81, temperature 97.3 F (36.3 C), temperature source Oral, resp. rate 16, height 5\' 11"  (1.803 m), weight 87.544 kg (193 lb), SpO2 100.00%.Body mass index is 26.93 kg/(m^2).  General Appearance: Guarded  Eye Contact::  Minimal  Speech:  Clear and Coherent  Volume:  Decreased  Mood:  Anxious and Depressed  Affect:  Depressed and Flat  Thought Process:  Coherent and Goal Directed  Orientation:  Full (Time,  Place, and Person)  Thought Content:  Obsessions, Paranoid Ideation and Rumination  Suicidal Thoughts:  Yes.  without intent/plan  Homicidal Thoughts:  No  Memory:  Immediate;   Fair  Judgement:  Impaired  Insight:  Lacking  Psychomotor Activity:  Psychomotor Retardation  Concentration:  Fair  Recall:  [place]: Fair  Akathisia:  NA  Handed:  Right  AIMS (if indicated):     Assets:  Communication Skills Desire for Improvement Physical Health Resilience Social Support Talents/Skills Transportation  Sleep:  Number of Hours: 5.5   Musculoskeletal: Strength & Muscle Tone: within normal limits Gait & Station: normal Patient leans: N/A  Current Medications: Current Facility-Administered Medications  Medication Dose Route Frequency Provider Last Rate Last Dose  . acetaminophen (TYLENOL) tablet 650 mg  650 mg Oral Q6H PRN Elmarie Shiley, NP   650 mg at 05/10/14 1849  . alum & mag hydroxide-simeth (MAALOX/MYLANTA) 200-200-20 MG/5ML suspension 30 mL  30 mL Oral Q4H PRN Elmarie Shiley, NP      . amitriptyline (ELAVIL) tablet 150 mg  150 mg Oral QHS Elmarie Shiley, NP   150 mg at 05/10/14 2208  . clonazePAM (KLONOPIN) tablet 1 mg  1 mg Oral BID Elmarie Shiley, NP   1 mg at 05/10/14 2211  . magnesium hydroxide (MILK OF MAGNESIA) suspension 30 mL  30 mL Oral Daily PRN Elmarie Shiley, NP      . metoprolol succinate (TOPROL-XL) 24 hr tablet 100 mg  100 mg Oral QHS Benjamine Mola,  FNP   100 mg at 05/10/14 2206  . OLANZapine zydis (ZYPREXA) disintegrating tablet 5 mg  5 mg Oral Q8H PRN Elmarie Shiley, NP      . phenytoin (DILANTIN) ER capsule 200 mg  200 mg Oral QHS Elmarie Shiley, NP   200 mg at 05/10/14 2208  . QUEtiapine (SEROQUEL) tablet 800 mg  800 mg Oral QHS Dara Hoyer, PA-C   800 mg at 05/10/14 2208  . simvastatin (ZOCOR) tablet 40 mg  40 mg Oral q1800 Elmarie Shiley, NP   40 mg at 05/10/14 2212  . traZODone (DESYREL) tablet 100 mg  100 mg Oral QHS PRN Elmarie Shiley, NP         Lab Results: No results found for this or any previous visit (from the past 48 hour(s)).  Physical Findings: AIMS: Facial and Oral Movements Muscles of Facial Expression: None, normal Lips and Perioral Area: None, normal Jaw: None, normal Tongue: None, normal,Extremity Movements Upper (arms, wrists, hands, fingers): None, normal Lower (legs, knees, ankles, toes): None, normal, Trunk Movements Neck, shoulders, hips: None, normal, Overall Severity Severity of abnormal movements (highest score from questions above): None, normal Incapacitation due to abnormal movements: None, normal Patient's awareness of abnormal movements (rate only patient's report): No Awareness, Dental Status Current problems with teeth and/or dentures?: No Does patient usually wear dentures?: No  CIWA:    COWS:     Treatment Plan Summary: Daily contact with patient to assess and evaluate symptoms and progress in treatment Medication management  Plan: Increase Zyprexa 10 mg PO Qhs and continue other medications Review previous discharge medication from Feb admission  Medical Decision Making Problem Points:  Established problem, worsening (2), New problem, with no additional work-up planned (3), Review of last therapy session (1) and Review of psycho-social stressors (1) Data Points:  Review or order clinical lab tests (1) Review or order medicine tests (1) Review of medication regiment & side effects (2) Review of new medications or change in dosage (2)  I certify that inpatient services furnished can reasonably be expected to improve the patient's condition.   Parke Simmers Zamier Eggebrecht 05/11/2014, 2:05 PM

## 2014-05-11 NOTE — Progress Notes (Signed)
Adult Psychoeducational Group Note  Date:  05/11/2014 Time:  9:14 PM  Group Topic/Focus:  Wrap-Up Group:   The focus of this group is to help patients review their daily goal of treatment and discuss progress on daily workbooks.  Participation Level:  Did Not Attend  Additional Comments:  Pt did not attend group.  Milus Glazier 05/11/2014, 9:14 PM

## 2014-05-12 NOTE — BHH Group Notes (Signed)
[facility] LCSW Aftercare Discharge Planning Group Note   05/12/2014  8:45 AM  Participation Quality:  Did Not Attend - pt sleeping in his room  Regan Lemming, [place] 05/12/2014 9:26 AM

## 2014-05-12 NOTE — Tx Team (Signed)
Interdisciplinary Treatment Plan Update (Adult)  Date: 05/12/2014  Time Reviewed:  9:45 AM  Progress in Treatment: Attending groups: Yes Participating in groups:  Yes Taking medication as prescribed:  Yes Tolerating medication:  Yes Family/Significant othe contact made: No, pt refused Patient understands diagnosis:  Yes Discussing patient identified problems/goals with staff:  Yes Medical problems stabilized or resolved:  Yes Denies suicidal/homicidal ideation: Yes Issues/concerns per patient self-inventory:  Yes Other:  New problem(s) identified: Pt has 72 hour request for discharge which is up today, RN to talk with pt about rescinding due to continued SI.    Discharge Plan or Barriers: Pt has follow up scheduled at [facility] for outpatient medication management and therapy.    Reason for Continuation of Hospitalization: Anxiety Depression Medication Stabilization  Comments: N/A  Estimated length of stay: 3-5 days  For review of initial/current patient goals, please see plan of care.  Attendees: Patient:     Family:     Physician:  Dr. Zorita Pang 05/12/2014 10:12 AM   Nursing:   Gaylan Gerold, RN 05/12/2014 10:12 AM   Clinical Social Worker:  Regan Lemming, LCSW 05/12/2014 10:12 AM   Other: Nena Polio, PA 05/12/2014 10:12 AM   Other:  Norberto Sorenson, care coordination 05/12/2014 10:12 AM   Other:  Darrol Angel, RN 05/12/2014 10:12 AM   Other:  Para March, RN 05/12/2014 10:13 AM   Other:    Other:    Other:    Other:    Other:    Other:     Scribe for Treatment Team:   Ane Payment, 05/12/2014 10:12 AM

## 2014-05-12 NOTE — Progress Notes (Signed)
Patient ID: [name], male   DOB: 05-07-1956, 58 y.o.   MRN: [id number] [facility] MD Progress Note  05/12/2014 2:50 PM [name]  MRN:  [id number] Subjective:  Patient notes that he feels a little worse today. He isn't sleeping well, states he is not getting out of bed as he doesn't feel like getting out of bed. He states he hasn't been in the dining room in two days. He is not going to groups because he doesn't feel like being around people. He rates his depression as an 8.5/10 but it's been worse before, and his anxiety is a 8/10, which he says comes and goes., he states he feels numb and he can't feel his emotions, but he does feel panic, but doesn't have panic attacks.      Denies SI/HI denies AVH. Diagnosis:   DSM5: Schizophrenia Disorders:  Schizophrenia (295.7) Obsessive-Compulsive Disorders:   Trauma-Stressor Disorders:   Substance/Addictive Disorders:   Depressive Disorders:   Total Time spent with patient: 30 minutes  Axis I: Schizoaffective Disorder  ADL's:  Impaired  Sleep: Fair  Appetite:  Fair  Suicidal Ideation:  Denies SI  Homicidal Ideation:  denied AEB (as evidenced by):  Psychiatric Specialty Exam: Physical Exam  ROS  Blood pressure 100/62, pulse 91, temperature 97.8 F (36.6 C), temperature source Oral, resp. rate 16, height 5\' 11"  (1.803 m), weight 87.544 kg (193 lb), SpO2 100.00%.Body mass index is 26.93 kg/(m^2).  General Appearance: Guarded  Eye Contact::  Minimal  Speech:  Clear and Coherent  Volume:  Decreased  Mood:  Anxious and Depressed  Affect:  Depressed and Flat  Thought Process:  Coherent and Goal Directed  Orientation:  Full (Time, Place, and Person)  Thought Content:  Obsessions, Paranoid Ideation and Rumination  Suicidal Thoughts:  Yes.  without intent/plan  Homicidal Thoughts:  No  Memory:  Immediate;   Fair  Judgement:  Impaired  Insight:  Lacking  Psychomotor Activity:  Psychomotor Retardation  Concentration:  Fair  Recall:   [place]: Fair  Akathisia:  NA  Handed:  Right  AIMS (if indicated):     Assets:  Communication Skills Desire for Improvement Physical Health Resilience Social Support Talents/Skills Transportation  Sleep:  Number of Hours: 5.5   Musculoskeletal: Strength & Muscle Tone: within normal limits Gait & Station: normal Patient leans: N/A  Current Medications: Current Facility-Administered Medications  Medication Dose Route Frequency Provider Last Rate Last Dose  . acetaminophen (TYLENOL) tablet 650 mg  650 mg Oral Q6H PRN Elmarie Shiley, NP   650 mg at 05/10/14 1849  . alum & mag hydroxide-simeth (MAALOX/MYLANTA) 200-200-20 MG/5ML suspension 30 mL  30 mL Oral Q4H PRN Elmarie Shiley, NP      . amitriptyline (ELAVIL) tablet 150 mg  150 mg Oral QHS Elmarie Shiley, NP   150 mg at 05/11/14 2312  . clonazePAM (KLONOPIN) tablet 1 mg  1 mg Oral BID Elmarie Shiley, NP   1 mg at 05/12/14 0800  . magnesium hydroxide (MILK OF MAGNESIA) suspension 30 mL  30 mL Oral Daily PRN Elmarie Shiley, NP      . metoprolol succinate (TOPROL-XL) 24 hr tablet 100 mg  100 mg Oral QHS Benjamine Mola, FNP   100 mg at 05/11/14 2200  . OLANZapine zydis (ZYPREXA) disintegrating tablet 10 mg  10 mg Oral Q8H PRN Durward Parcel, MD      . phenytoin (DILANTIN) ER capsule 200 mg  200 mg Oral  QHS Elmarie Shiley, NP   200 mg at 05/11/14 2311  . QUEtiapine (SEROQUEL) tablet 800 mg  800 mg Oral QHS Dara Hoyer, PA-C   800 mg at 05/11/14 2311  . simvastatin (ZOCOR) tablet 40 mg  40 mg Oral q1800 Elmarie Shiley, NP   40 mg at 05/11/14 1703  . traZODone (DESYREL) tablet 100 mg  100 mg Oral QHS PRN Elmarie Shiley, NP        Lab Results: No results found for this or any previous visit (from the past 48 hour(s)).  Physical Findings: AIMS: Facial and Oral Movements Muscles of Facial Expression: None, normal Lips and Perioral Area: None, normal Jaw: None, normal Tongue: None, normal,Extremity  Movements Upper (arms, wrists, hands, fingers): None, normal Lower (legs, knees, ankles, toes): None, normal, Trunk Movements Neck, shoulders, hips: None, normal, Overall Severity Severity of abnormal movements (highest score from questions above): None, normal Incapacitation due to abnormal movements: None, normal Patient's awareness of abnormal movements (rate only patient's report): No Awareness, Dental Status Current problems with teeth and/or dentures?: No Does patient usually wear dentures?: No  CIWA:  CIWA-Ar Total: 1 COWS:  COWS Total Score: 2  Treatment Plan Summary: Daily contact with patient to assess and evaluate symptoms and progress in treatment Medication management  Plan: 1. Continue crisis management and stabilization. 2. Continue medication management by assessing side effects, dose modification as needed and therapeutic blood levels as indicated. 3. Treat health problems as indicated or consult IM as needed. 4. Continue treatment plan to decrease risk of relapse upon discharge and to reduce the     need for readmission to incorporate the use of local resources for support. 5. Psycho-social education regarding relapse prevention through good self care to incorporate diet, exercise, stress reduction, good sleep hygiene, and reduction in external risk factors such as alcohol, tobacco, and substance abuse. 6. Gain consent for contact with family, PCP, or outside health provider as needed. 7. Review home medications as needed. 8. Patient is encouraged to attend groups. 9. TSH to rule out hypothyroidism. 10. Will consider second seizure medication for possible mood stabilization.   Increase Zyprexa 10 mg PO Qhs and continue other medications Review previous discharge medication from Feb admission  Medical Decision Making Problem Points:  Established problem, worsening (2), New problem, with no additional work-up planned (3), Review of last therapy session (1) and Review  of psycho-social stressors (1) Data Points:  Review or order clinical lab tests (1) Review or order medicine tests (1) Review of medication regiment & side effects (2) Review of new medications or change in dosage (2)  I certify that inpatient services furnished can reasonably be expected to improve the patient's condition.   Marlane Hatcher. [place] RPAC 3:15 PM 05/12/2014  Reviewed the information documented and agree with the treatment plan.  Parke Simmers Janifer Gieselman 05/12/2014 8:26 PM

## 2014-05-12 NOTE — Progress Notes (Signed)
D: Pt presents anxious, pacing in hallway, irritable, poor appetite and depressed mood. Pt rates depression 8.5/10 and endorses passive SI. Pt denies having a plan but stated that he is trying to come up with methods to kill himself, such as using helium. Pt verbally contracts for safety. Pt isolates in his room and refuses to attend groups and meals. Pt compliant with taking meds. A: Medications administered as ordered per MD. Verbal support given. Pt encouraged to attend groups. 15 minute checks performed for safety. R: Pt safety maintained at this time.

## 2014-05-12 NOTE — Progress Notes (Signed)
Adult Psychoeducational Group Note  Date:  05/12/2014 Time:  11:56 PM  Group Topic/Focus:  Wrap-Up Group:   The focus of this group is to help patients review their daily goal of treatment and discuss progress on daily workbooks.  Participation Level:  Active  Participation Quality:  Appropriate  Affect:  Appropriate  Cognitive:  Appropriate  Insight: Appropriate  Engagement in Group:  Engaged  Modes of Intervention:  Discussion  Additional Comments: The patient expressed that he did not like the medication he was ordered.The pat feels that he will feel better when his medications are right.  Thayer Dallas Girlie Veltri 05/12/2014, 11:56 PM

## 2014-05-12 NOTE — Progress Notes (Signed)
Patient ID: [name], male   DOB: 1956-06-17, 58 y.o.   MRN: [id number]   D: After the introduction the writer asked the pt about his day. Pt stated, "I don't feel like I;m getting nay better." Stated he spoke with the NP and "spent more time with her than his Dr".  Pt stated, "they cut my meds way back and I'm a rapid metabolizer". Pt stated that "SSRI's make him worse and sinequan works best". Writer spoke to pt about report stating "he stays in bed most of the day". Stated, "I don't have an appetite because of my depression".   A:  Support and encouragement was offered. 15 min checks continued for safety.  R: Pt remains safe.

## 2014-05-12 NOTE — [facility] Group Notes (Signed)
[facility] LCSW Group Therapy  05/12/2014 1:15 PM   Type of Therapy:  Group Therapy  Participation Level:  Did Not Attend - pt sleeping in his room  Tashya Alberty Horton, LCSW 05/12/2014 3:08 PM  

## 2014-05-13 LAB — TSH: TSH: 2.97 u[IU]/mL (ref 0.350–4.500)

## 2014-05-13 MED ORDER — QUETIAPINE FUMARATE ER 50 MG PO TB24: 9000.0000 mg | ORAL_TABLET | Freq: Every day | ORAL | Status: DC

## 2014-05-13 MED ORDER — AMITRIPTYLINE HCL 100 MG PO TABS
300.0000 mg | ORAL_TABLET | Freq: Every day | ORAL | Status: DC
  Administered 2014-05-13 – 2014-05-16 (×4): 300 mg via ORAL
  Filled 2014-05-13: qty 3
  Filled 2014-05-13: qty 42
  Filled 2014-05-13 (×4): qty 3

## 2014-05-13 MED ORDER — QUETIAPINE FUMARATE ER 300 MG PO TB24
900.0000 mg | ORAL_TABLET | Freq: Every day | ORAL | Status: DC
  Administered 2014-05-13 – 2014-05-16 (×4): 900 mg via ORAL
  Filled 2014-05-13 (×4): qty 3
  Filled 2014-05-13: qty 42
  Filled 2014-05-13: qty 3

## 2014-05-13 MED ORDER — AMITRIPTYLINE HCL 100 MG PO TABS
200.0000 mg | ORAL_TABLET | Freq: Every day | ORAL | Status: DC
  Filled 2014-05-13 (×2): qty 2

## 2014-05-13 NOTE — [facility] Group Notes (Signed)
[facility] LCSW Group Therapy  05/13/2014   1:15 PM   Type of Therapy:  Group Therapy  Participation Level:  Active  Participation Quality:  Attentive, Sharing and Supportive  Affect:  Depressed and Flat  Cognitive:  Alert and Oriented  Insight:  Developing/Improving and Engaged  Engagement in Therapy:  Developing/Improving and Engaged  Modes of Intervention:  Activity, Clarification, Confrontation, Discussion, Education, Exploration, Limit-setting, Orientation, Problem-solving, Rapport Building, Art therapist, Socialization and Support  Summary of Progress/Problems: Patient was attentive and engaged with speaker from [facility].  Patient was attentive to speaker while they shared their story of dealing with mental health and overcoming it.  Patient expressed interest in their programs and services and received information on their agency.  Patient processed ways they can relate to the speaker.     Regan Lemming, LCSW 05/13/2014

## 2014-05-13 NOTE — Progress Notes (Signed)
Patient ID: [name], male   DOB: 08-11-1956, 58 y.o.   MRN: [id number] D: Pt: in dayroom interacting, reports depression "8, getting better medications finally working" "I'm interaciting, getting out of my room more" A: Probation officer introduced self to client, provided emotional support, encouraged group. Staff will monitor q80min for safety. R: Pt. Is safe on unit, attended karaoke.

## 2014-05-13 NOTE — Progress Notes (Signed)
Adult Psychoeducational Group Note  Date:  05/12/2014 Time:  10:00am Group Topic/Focus:  Personal Choices and Values:   The focus of this group is to help patients assess and explore the importance of values in their lives, how their values affect their decisions, how they express their values and what opposes their expression.  Participation Level:    Participation Quality:    Affect:    Cognitive:    Insight:   Engagement in Group:    Modes of Intervention:    Additional Comments:  Pt did not attend group  Gailen Shelter 05/13/2014, 9:40 AM

## 2014-05-13 NOTE — Progress Notes (Signed)
Adult Psychoeducational Group Note  Date:  05/13/2014 Time:  10:00am Group Topic/Focus:  Identifying Needs:   The focus of this group is to help patients identify their personal needs that have been historically problematic and identify healthy behaviors to address their needs. Making Healthy Choices:   The focus of this group is to help patients identify negative/unhealthy choices they were using prior to admission and identify positive/healthier coping strategies to replace them upon discharge.  Participation Level:  Active  Participation Quality:  Appropriate and Attentive  Affect:  Appropriate  Cognitive:  Alert and Appropriate  Insight: Appropriate  Engagement in Group:  Engaged  Modes of Intervention:  Discussion and Education  Additional Comments: Discussion was on lifestyle changes and what is one  Change you can make to improve your life? Pt stated to stop being on face book so much.  Gailen Shelter 05/13/2014, 1:58 PM

## 2014-05-13 NOTE — Progress Notes (Signed)
D:  Patient's self inventory sheet, patient sometimes has fair/poor sleep, needs sleep medications.  Poor appetite, had a sandwich about midnight.  Low energy level, poor attention span.  Rated depression 8.5, anxiety 8, hopeless 9.  Denied withdrawals.  Has experienced headache in past 24 hours.  Worst pain was 8, zero pain goal.  "I am not feeling any better.  I need to be back on the same medicine/dosage I left here a year ago."  No problems with medications after discharge. A:  Medications administered per MD orders.  Emotional support ane encouragement given patient. R:  Denied SI and HI.  Denied A/V hallucinations.  Will continue to monitor patient for safety with 15 minute checks.  Safety maintained.  MD/PA will talk to patient about his medication concerns.

## 2014-05-13 NOTE — Progress Notes (Signed)
The focus of this group is to educate the patient on the purpose and policies of crisis stabilization and provide a format to answer questions about their admission.  The group details unit policies and expectations of patients while admitted.  Patient attended 0900 nurse education orientation group this morning.  Patient actively participated, appropriate affect, alert, appropriate insight and engagement.  Today patient will work on 3 goals for discharge.  

## 2014-05-13 NOTE — Progress Notes (Signed)
Patient ID: [name], male   DOB: [birth year], 58 y.o.   MRN: [id number]   D: Pt is still has a flat affect, but appears to be anxious at times, still pacing the hall at times. Pt spoke to the writer informing him of report given for daily weights. Pt stated he doesn't have an appetite due to his depression. Writer reminded pt that at this time he's not feeling well mentally. However, it would only exacerbate matters if he was to become physically ill. Informed pt that if he continues to deprive himself of food, it may be medically necessary to transport him for additional care.  Pt acknowledged understanding of the writer's concern. Later in the shift pt came and requested a sandwich from the writer, saying "I think I can eat half a sandwich".  A: Writer went to [employer] and got a sandwich plate for the pt. Writer checked on pt to see if he had eaten his plate and was told that he ate the sandwich but not the other items. 15 mins checks for safety. Encouragement and support was offered.   R: Pt remains safe.

## 2014-05-13 NOTE — Progress Notes (Signed)
Patient ID: [name], male   DOB: Dec 19, 1955, 58 y.o.   MRN: [id number] Patient ID: [name], male   DOB: Feb 03, 1956, 58 y.o.   MRN: [id number] [facility] MD Progress Note  05/13/2014 2:45 PM [name]  MRN:  [id number]  Subjective:  Patient complains depression, anxiety and insomnia and continuously asking about his medications needs to be higher dose than provided to control his symptoms. Patient is concerned and worried about not sleeping and not eating well since he was admitted. Patient provided consent to contact his primary psychiatrist on the phone. Patient primary psychiatrist called back and stated his medication doxepin was changed to Elavil 450 mg about 2 months ago because he is poorly metabolizes and well established in his practice. He also reported his medications have never been toxic levels. Patient agreed to take higher dose of the medication to 300 mg, and also will check for therapeutic window. He isn't sleeping well, states he is not getting out of bed as he doesn't feel like getting out of bed. He states he hasn't been in the dining room in two days. He is not going to groups because he doesn't feel like being around people. He rates his depression as an 8.5/10 but it's been worse before, and his anxiety is a 8/10,, he states he feels numb and he can't feel his emotions, but he does feel panic, but doesn't have panic attacks.  patient contracts for safety while in the hospital and denies current symptoms of for suicide and homicidal thoughts. Patient has no AVH. Diagnosis:   DSM5: Schizophrenia Disorders:  Schizophrenia (295.7) Obsessive-Compulsive Disorders:   Trauma-Stressor Disorders:   Substance/Addictive Disorders:   Depressive Disorders:   Total Time spent with patient: 30 minutes  Axis I: Schizoaffective Disorder  ADL's:  Impaired  Sleep: Fair  Appetite:  Fair  Suicidal Ideation:  Denies SI  Homicidal Ideation:  denied AEB (as evidenced by):  Psychiatric  Specialty Exam: Physical Exam  ROS  Blood pressure 102/69, pulse 86, temperature 97.7 F (36.5 C), temperature source Oral, resp. rate 16, height 5\' 11"  (1.803 m), weight 90.719 kg (200 lb), SpO2 100.00%.Body mass index is 27.91 kg/(m^2).  General Appearance: Guarded  Eye Contact::  Minimal  Speech:  Clear and Coherent  Volume:  Decreased  Mood:  Anxious and Depressed  Affect:  Depressed and Flat  Thought Process:  Coherent and Goal Directed  Orientation:  Full (Time, Place, and Person)  Thought Content:  Obsessions, Paranoid Ideation and Rumination  Suicidal Thoughts:  Yes.  without intent/plan  Homicidal Thoughts:  No  Memory:  Immediate;   Fair  Judgement:  Impaired  Insight:  Lacking  Psychomotor Activity:  Psychomotor Retardation  Concentration:  Fair  Recall:  [place]: Fair  Akathisia:  NA  Handed:  Right  AIMS (if indicated):     Assets:  Communication Skills Desire for Improvement Physical Health Resilience Social Support Talents/Skills Transportation  Sleep:  Number of Hours: 6.25   Musculoskeletal: Strength & Muscle Tone: within normal limits Gait & Station: normal Patient leans: N/A  Current Medications: Current Facility-Administered Medications  Medication Dose Route Frequency Provider Last Rate Last Dose  . acetaminophen (TYLENOL) tablet 650 mg  650 mg Oral Q6H PRN Elmarie Shiley, NP   650 mg at 05/10/14 1849  . alum & mag hydroxide-simeth (MAALOX/MYLANTA) 200-200-20 MG/5ML suspension 30 mL  30 mL Oral Q4H PRN Elmarie Shiley, NP      .  amitriptyline (ELAVIL) tablet 150 mg  150 mg Oral QHS Elmarie Shiley, NP   150 mg at 05/11/14 2312  . clonazePAM (KLONOPIN) tablet 1 mg  1 mg Oral BID Elmarie Shiley, NP   1 mg at 05/12/14 0800  . magnesium hydroxide (MILK OF MAGNESIA) suspension 30 mL  30 mL Oral Daily PRN Elmarie Shiley, NP      . metoprolol succinate (TOPROL-XL) 24 hr tablet 100 mg  100 mg Oral QHS Benjamine Mola, FNP   100 mg at 05/11/14  2200  . OLANZapine zydis (ZYPREXA) disintegrating tablet 10 mg  10 mg Oral Q8H PRN Durward Parcel, MD      . phenytoin (DILANTIN) ER capsule 200 mg  200 mg Oral QHS Elmarie Shiley, NP   200 mg at 05/11/14 2311  . QUEtiapine (SEROQUEL) tablet 800 mg  800 mg Oral QHS Dara Hoyer, PA-C   800 mg at 05/11/14 2311  . simvastatin (ZOCOR) tablet 40 mg  40 mg Oral q1800 Elmarie Shiley, NP   40 mg at 05/11/14 1703  . traZODone (DESYREL) tablet 100 mg  100 mg Oral QHS PRN Elmarie Shiley, NP        Lab Results:  Results for orders placed during the hospital encounter of 05/08/14 (from the past 48 hour(s))  TSH     Status: None   Collection Time    05/12/14  7:45 PM      Result Value Ref Range   TSH 2.970  0.350 - 4.500 uIU/mL   Comment: Please note change in reference range.     Performed at [facility]    Physical Findings: AIMS: Facial and Oral Movements Muscles of Facial Expression: None, normal Lips and Perioral Area: None, normal Jaw: None, normal Tongue: None, normal,Extremity Movements Upper (arms, wrists, hands, fingers): None, normal Lower (legs, knees, ankles, toes): None, normal, Trunk Movements Neck, shoulders, hips: None, normal, Overall Severity Severity of abnormal movements (highest score from questions above): None, normal Incapacitation due to abnormal movements: None, normal Patient's awareness of abnormal movements (rate only patient's report): No Awareness, Dental Status Current problems with teeth and/or dentures?: No Does patient usually wear dentures?: No  CIWA:  CIWA-Ar Total: 1 COWS:  COWS Total Score: 2  Treatment Plan Summary: Daily contact with patient to assess and evaluate symptoms and progress in treatment Medication management  Plan: 1. Continue crisis management and stabilization. 2. Continue medication management by assessing side effects, dose modification as needed and therapeutic blood levels as indicated. 3. Treat health problems as  indicated or consult IM as needed. 4. Continue treatment plan to decrease risk of relapse upon discharge and to reduce the     need for readmission to incorporate the use of local resources for support. 5. Psycho-social education regarding relapse prevention through good self care to incorporate diet, exercise, stress reduction, good sleep hygiene, and reduction in external risk factors such as alcohol, tobacco, and substance abuse. 6. Gain consent for contact with family, PCP, or outside health provider as needed. 7. Review home medications as needed. 8. Patient is encouraged to attend groups. 9. TSH to rule out hypothyroidism. 10. Will consider second seizure medication for possible mood stabilization.   Increase Seroquel XR 900mg   mg PO Qhs  Increase amitriptyline 300 mg at bedtime Check amitriptyline level tomorrow morning Reviewed previous discharge medication from Feb admission with the patient    Medical Decision Making Problem Points:  Established problem, worsening (2), New problem, with no additional  work-up planned (3), Review of last therapy session (1) and Review of psycho-social stressors (1) Data Points:  Review or order clinical lab tests (1) Review or order medicine tests (1) Review of medication regiment & side effects (2) Review of new medications or change in dosage (2)  I certify that inpatient services furnished can reasonably be expected to improve the patient's condition.   Parke Simmers Norlene Lanes 05/13/2014 2:45 PM

## 2014-05-14 ENCOUNTER — Other Ambulatory Visit: Payer: Self-pay | Admitting: *Deleted

## 2014-05-14 NOTE — [facility] Group Notes (Signed)
[facility] LCSW Aftercare Discharge Planning Group Note   05/14/2014 10:06 AM  Participation Quality:  Did not attend group.   [name]

## 2014-05-14 NOTE — Progress Notes (Signed)
Patient ID: [name], male   DOB: 04/08/1956, 58 y.o.   MRN: [id number] [facility] MD Progress Note  05/14/2014 1:35 PM [name]  MRN:  [id number]  Subjective:  Met with the patient today who says his depression is a "little bit better." He knows this because he is out of his bed more, and attending groups, and eating more.  He denies SI/HI and has no AVH. He rates his depression as a 7/10, and goes on to say that he can't get rid of his emotional numbness.  He says he is not ruminating as much.  His TSH is reviewed and he is informed of the results which are normal.  Diagnosis:   DSM5: Schizophrenia Disorders:  Schizophrenia (295.7) Obsessive-Compulsive Disorders:   Trauma-Stressor Disorders:   Substance/Addictive Disorders:   Depressive Disorders:   Total Time spent with patient: 30 minutes  Axis I: Schizoaffective Disorder  ADL's:  Impaired  Sleep: Fair  Appetite:  Fair  Suicidal Ideation:  Denies SI  Homicidal Ideation:  denied AEB (as evidenced by):  Psychiatric Specialty Exam: Physical Exam  ROS  Blood pressure 106/65, pulse 93, temperature 98.3 F (36.8 C), temperature source Oral, resp. rate 20, height 5' 11"  (1.803 m), weight 89.585 kg (197 lb 8 oz), SpO2 100.00%.Body mass index is 27.56 kg/(m^2).  General Appearance: Guarded  Eye Contact::  Minimal  Speech:  Clear and Coherent  Volume:  Decreased  Mood:  Anxious and Depressed  Affect:  Depressed and Flat  Thought Process:  Coherent and Goal Directed  Orientation:  Full (Time, Place, and Person)  Thought Content:  Obsessions, Paranoid Ideation and Rumination  Suicidal Thoughts:  Yes.  without intent/plan  Homicidal Thoughts:  No  Memory:  Immediate;   Fair  Judgement:  Impaired  Insight:  Lacking  Psychomotor Activity:  Psychomotor Retardation  Concentration:  Fair  Recall:  [place]: Fair  Akathisia:  NA  Handed:  Right  AIMS (if indicated):     Assets:  Communication  Skills Desire for Improvement Physical Health Resilience Social Support Talents/Skills Transportation  Sleep:  Number of Hours: 7   Musculoskeletal: Strength & Muscle Tone: within normal limits Gait & Station: normal Patient leans: N/A  Current Medications: Current Facility-Administered Medications  Medication Dose Route Frequency Provider Last Rate Last Dose  . acetaminophen (TYLENOL) tablet 650 mg  650 mg Oral Q6H PRN Elmarie Shiley, NP   650 mg at 05/10/14 1849  . alum & mag hydroxide-simeth (MAALOX/MYLANTA) 200-200-20 MG/5ML suspension 30 mL  30 mL Oral Q4H PRN Elmarie Shiley, NP      . amitriptyline (ELAVIL) tablet 150 mg  150 mg Oral QHS Elmarie Shiley, NP   150 mg at 05/11/14 2312  . clonazePAM (KLONOPIN) tablet 1 mg  1 mg Oral BID Elmarie Shiley, NP   1 mg at 05/12/14 0800  . magnesium hydroxide (MILK OF MAGNESIA) suspension 30 mL  30 mL Oral Daily PRN Elmarie Shiley, NP      . metoprolol succinate (TOPROL-XL) 24 hr tablet 100 mg  100 mg Oral QHS Benjamine Mola, FNP   100 mg at 05/11/14 2200  . OLANZapine zydis (ZYPREXA) disintegrating tablet 10 mg  10 mg Oral Q8H PRN Durward Parcel, MD      . phenytoin (DILANTIN) ER capsule 200 mg  200 mg Oral QHS Elmarie Shiley, NP   200 mg at 05/11/14 2311  . QUEtiapine (SEROQUEL) tablet 800 mg  800 mg Oral  QHS Dara Hoyer, PA-C   800 mg at 05/11/14 2311  . simvastatin (ZOCOR) tablet 40 mg  40 mg Oral q1800 Elmarie Shiley, NP   40 mg at 05/11/14 1703  . traZODone (DESYREL) tablet 100 mg  100 mg Oral QHS PRN Elmarie Shiley, NP        Lab Results:  Results for orders placed during the hospital encounter of 05/08/14 (from the past 48 hour(s))  TSH     Status: None   Collection Time    05/12/14  7:45 PM      Result Value Ref Range   TSH 2.970  0.350 - 4.500 uIU/mL   Comment: Please note change in reference range.     Performed at [facility]    Physical Findings: AIMS: Facial and Oral Movements Muscles of Facial Expression: None,  normal Lips and Perioral Area: None, normal Jaw: None, normal Tongue: None, normal,Extremity Movements Upper (arms, wrists, hands, fingers): None, normal Lower (legs, knees, ankles, toes): None, normal, Trunk Movements Neck, shoulders, hips: None, normal, Overall Severity Severity of abnormal movements (highest score from questions above): None, normal Incapacitation due to abnormal movements: None, normal Patient's awareness of abnormal movements (rate only patient's report): No Awareness, Dental Status Current problems with teeth and/or dentures?: No Does patient usually wear dentures?: No  CIWA:  CIWA-Ar Total: 1 COWS:  COWS Total Score: 2  Treatment Plan Summary: Daily contact with patient to assess and evaluate symptoms and progress in treatment Medication management  Plan: 1. Continue crisis management and stabilization. 2. Continue medication management by assessing side effects, dose modification as needed and therapeutic blood levels as indicated. 3. Treat health problems as indicated or consult IM as needed. 4. Continue treatment plan to decrease risk of relapse upon discharge and to reduce the need for readmission to incorporate the use of local resources for support. 5. Psycho-social education regarding relapse prevention through good self care to incorporate diet, exercise, stress reduction, good sleep hygiene, and reduction in external risk factors such as alcohol, tobacco, and substance abuse. 6. Gain consent for contact with family, PCP, or outside health provider as needed. 7. Review home medications as needed. 8. Patient is encouraged to attend groups. 9. TSH to rule out hypothyroidism. 10. Will consider second seizure medication for possible mood stabilization.  Continue Seroquel XR 935m  mg PO Qhs  Continue  amitriptyline 300 mg at bedtime Check amitriptyline level tomorrow morning Reviewed previous discharge medication from Feb admission with the patient     Medical Decision Making Problem Points:  Established problem, worsening (2), New problem, with no additional work-up planned (3), Review of last therapy session (1) and Review of psycho-social stressors (1) Data Points:  Review or order clinical lab tests (1) Review or order medicine tests (1) Review of medication regiment & side effects (2) Review of new medications or change in dosage (2)  I certify that inpatient services furnished can reasonably be expected to improve the patient's condition.  NMarlane Hatcher Mashburn RPAC 2:17 PM 05/14/2014  Reviewed the information documented and agree with the treatment plan.  JParke SimmersJonnalagadda 05/14/2014 7:05 PM

## 2014-05-14 NOTE — Telephone Encounter (Signed)
Pt informed that he would need to sched an appt to get his refill.  Want to call back to make that appt.

## 2014-05-14 NOTE — Progress Notes (Signed)
Attended group 

## 2014-05-14 NOTE — Progress Notes (Signed)
Adult Psychoeducational Group Note  Date:  05/14/2014 Time:  2:32 PM  Group Topic/Focus:  Relapse Prevention Planning:   The focus of this group is to define relapse and discuss the need for planning to combat relapse.  Participation Level:  Active  Participation Quality:  Appropriate  Affect:  Appropriate  Cognitive:  Appropriate  Insight: Appropriate  Engagement in Group:  Engaged  Modes of Intervention:  Discussion  Additional Comments:  Pt attended group this morning. Pt was appropriate and participate in group.    [name] A [name] 05/14/2014,

## 2014-05-14 NOTE — Progress Notes (Signed)
D Anas is seen OOB UAL on the [facility] hall today..he tolerates this fair. He is Geneticist, molecular. HE makes little to no eye contact. HE is obviously nervous, anxious and appears uncomfortable.   A He takes his medications according to schedule. HE stutters....has difficulty completing his sentences and demonstrates thought blocking. He completes his morning inventory and on it he writes he denies SI within the  Past 24 hrs, he rates his depression and  Hopelessness  '3/3" and writes his DC plan is to " get out more".   R Safety is in place and poc moves forward.

## 2014-05-14 NOTE — [facility] Group Notes (Signed)
[place] LCSW Group Therapy  Feelings Around Relapse 1:15 -2:30        05/14/2014   Type of Therapy:  Group Therapy  Participation Level:  Appropriate  Participation Quality:  Appropriate  Affect:  Appropriate  Cognitive:  Attentive Appropriate  Insight:  Developing/Improving  Engagement in Therapy: Developing/Improving  Modes of Intervention:  Discussion Exploration Problem-Solving Supportive  Summary of Progress/Problems:  The topic for today was feelings around relapse.    Patient processed feelings toward relapse and was able to relate to peers. He shared relapsing for him is ruminating on past hurts/disappointments. Patient identified coping skills that can be used to prevent a relapse.   Concha Pyo 05/14/2014

## 2014-05-14 NOTE — Tx Team (Signed)
Interdisciplinary Treatment Plan Update   Date Reviewed:  05/14/2014  Time Reviewed:  8:33 AM  Progress in Treatment:   Attending groups: Yes Participating in groups: Yes Taking medication as prescribed: Yes  Tolerating medication: Yes Family/Significant other contact made:  No, patient declined collateral contact Patient understands diagnosis: Yes  Discussing patient identified problems/goals with staff: Yes Medical problems stabilized or resolved: Yes Denies suicidal/homicidal ideation: Yes Patient has not harmed self or others: Yes  For review of initial/current patient goals, please see plan of care.  Estimated Length of Stay:  3 - 4 days  Reasons for Continued Hospitalization:  Anxiety Depression Medication stabilization   New Problems/Goals identified:    Discharge Plan or Barriers:   Home with outpatient follow up with [name].  Referral to be made to [facility].  Additional Comments:  Continue medication stabilization  Attendees:  Patient:  05/14/2014 8:33 AM   Signature: Mylinda Latina, MD 05/14/2014 8:33 AM  Signature:  Nena Polio, PA 05/14/2014 8:33 AM  Signature:  Drake Leach, RN 05/14/2014 8:33 AM  Signature:Beverly Danelle Earthly, RN 05/14/2014 8:33 AM  Signature:  05/14/2014 8:33 AM  Signature:  Joette Catching, LCSW 05/14/2014 8:33 AM  Signature:  Regan Lemming, LCSW 05/14/2014 8:33 AM  Signature:  Lucinda Dell, Care Coordinator [facility] 05/14/2014 8:33 AM  Signature:   05/14/2014 8:33 AM  Signature: Briscoe Deutscher, RN 05/14/2014  8:33 AM  Signature:   Lars Pinks, RN [facility] 05/14/2014  8:33 AM  Signature: 05/14/2014  8:33 AM    Scribe for Treatment Team:   Joette Catching,  05/14/2014 8:33 AM

## 2014-05-15 MED ORDER — METOPROLOL SUCCINATE ER 50 MG PO TB24
100.0000 mg | ORAL_TABLET | Freq: Every day | ORAL | Status: DC
  Administered 2014-05-15 – 2014-05-16 (×2): 100 mg via ORAL
  Filled 2014-05-15 (×5): qty 2

## 2014-05-15 NOTE — Progress Notes (Signed)
Adult Psychoeducational Group Note  Date:  05/15/2014 Time:  11:56 PM  Group Topic/Focus:  Wrap-Up Group:   The focus of this group is to help patients review their daily goal of treatment and discuss progress on daily workbooks.  Participation Level:  Active  Participation Quality:  Appropriate  Affect:  Flat  Cognitive:  Confused  Insight: Appropriate  Engagement in Group:  Lacking  Modes of Intervention:  Discussion  Additional Comments: The patient expressed that he had a better day.The patient also said that he is looking forward to going home and improving his life.  [name] 05/15/2014, 11:56 PM

## 2014-05-15 NOTE — Progress Notes (Signed)
Patient ID: [name], male   DOB: 03/07/1956, 58 y.o.   MRN: [id number]  D: Patient pleasant and cooperative with care. Pt interacting well with peers in milieu.  A: Q 15 minute safety checks, encourage staff/peer interaction and group participation. Administer medications as ordered by MD. R: Patient denies SI or plans to harm himself. Pt states he is looking forward to being discharged. No distress noted during shift.

## 2014-05-15 NOTE — Progress Notes (Signed)
Psychoeducational Group Note  Date: 05/15/2014 Time:  1015  Group Topic/Focus:  Identifying Needs:   The focus of this group is to help patients identify their personal needs that have been historically problematic and identify healthy behaviors to address their needs.  Participation Level:  Active  Participation Quality:  Appropriate  Affect:  Appropriate  Cognitive:  Oriented  Insight:  Improving  Engagement in Group:  Engaged  Additional Comments:  Pt was attentive and involved in the group.  Jerika Wales A  

## 2014-05-15 NOTE — Progress Notes (Signed)
Patient ID: [name], male   DOB: 1956/04/18, 58 y.o.   MRN: [id number] [facility] MD Progress Note  05/15/2014 3:40 PM HARTMAN MINAHAN  MRN:  [id number]  Subjective:   Reports "I feel better today", "I've been OOB and going to groups"  Tells me he lives alone with his cat. The cat "whiskers" has been in his apt for the entire time he has been here, "this worries me"  He denies SI/HI and has no AVH. He rates his depression as a 2/10, Anxiety 2-3/10.  He ruminates on thoughts regarding his medications, needing a brain scan.  We will review today's labs tomorrow when results available.      Diagnosis:   DSM5: Schizophrenia Disorders:  Schizophrenia (295.7) Obsessive-Compulsive Disorders:   Trauma-Stressor Disorders:   Substance/Addictive Disorders:   Depressive Disorders:   Total Time spent with patient: 30 minutes  Axis I: Schizoaffective Disorder  ADL's:  Impaired  Sleep: Fair, difficulty falling asleep but once aslepp he sleeps well   Appetite:  Fair  Suicidal Ideation:  Denies SI  Homicidal Ideation:  denied AEB (as evidenced by):  Psychiatric Specialty Exam: Physical Exam  Constitutional: He is oriented to person, place, and time. He appears well-developed and well-nourished.  HENT:  Head: Normocephalic.  Neck: Normal range of motion. Neck supple.  Respiratory: Effort normal.  Musculoskeletal: Normal range of motion.  Neurological: He is alert and oriented to person, place, and time.  Skin: Skin is warm and dry.    ROS  Blood pressure 111/76, pulse 80, temperature 97.8 F (36.6 C), temperature source Oral, resp. rate 20, height 5\' 11"  (1.803 m), weight 89.585 kg (197 lb 8 oz), SpO2 100.00%.Body mass index is 27.56 kg/(m^2).  General Appearance: Guarded  Eye Contact::  Minimal  Speech:  Clear and Coherent  Volume:  Decreased  Mood:  Anxious and Depressed  Affect:  Depressed and Flat  Thought Process:  Coherent and Goal Directed  Orientation:  Full (Time, Place,  and Person)  Thought Content:  Obsessions, Paranoid Ideation and Rumination  Suicidal Thoughts:  Denies presently  Homicidal Thoughts:  No  Memory:  Immediate;   Fair  Judgement:  Impaired  Insight:  Lacking  Psychomotor Activity:  Psychomotor Retardation  Concentration:  Fair  Recall:  [place]: Fair  Akathisia:  NA  Handed:  Right  AIMS (if indicated):     Assets:  Communication Skills Desire for Improvement Physical Health Resilience Social Support Talents/Skills Transportation  Sleep:  Number of Hours: 7   Musculoskeletal: Strength & Muscle Tone: within normal limits Gait & Station: normal Patient leans: N/A  Current Medications: Current Facility-Administered Medications  Medication Dose Route Frequency Provider Last Rate Last Dose  . acetaminophen (TYLENOL) tablet 650 mg  650 mg Oral Q6H PRN Elmarie Shiley, NP   650 mg at 05/10/14 1849  . alum & mag hydroxide-simeth (MAALOX/MYLANTA) 200-200-20 MG/5ML suspension 30 mL  30 mL Oral Q4H PRN Elmarie Shiley, NP      . amitriptyline (ELAVIL) tablet 150 mg  150 mg Oral QHS Elmarie Shiley, NP   150 mg at 05/11/14 2312  . clonazePAM (KLONOPIN) tablet 1 mg  1 mg Oral BID Elmarie Shiley, NP   1 mg at 05/12/14 0800  . magnesium hydroxide (MILK OF MAGNESIA) suspension 30 mL  30 mL Oral Daily PRN Elmarie Shiley, NP      . metoprolol succinate (TOPROL-XL) 24 hr tablet 100 mg  100 mg Oral QHS [name]  C Withrow, FNP   100 mg at 05/11/14 2200  . OLANZapine zydis (ZYPREXA) disintegrating tablet 10 mg  10 mg Oral Q8H PRN Durward Parcel, MD      . phenytoin (DILANTIN) ER capsule 200 mg  200 mg Oral QHS Elmarie Shiley, NP   200 mg at 05/11/14 2311  . QUEtiapine (SEROQUEL) tablet 800 mg  800 mg Oral QHS Dara Hoyer, PA-C   800 mg at 05/11/14 2311  . simvastatin (ZOCOR) tablet 40 mg  40 mg Oral q1800 Elmarie Shiley, NP   40 mg at 05/11/14 1703  . traZODone (DESYREL) tablet 100 mg  100 mg Oral QHS PRN Elmarie Shiley, NP         Lab Results:  Results for orders placed during the hospital encounter of 05/08/14 (from the past 48 hour(s))  TSH     Status: None   Collection Time    05/12/14  7:45 PM      Result Value Ref Range   TSH 2.970  0.350 - 4.500 uIU/mL   Comment: Please note change in reference range.     Performed at [facility]    Physical Findings: AIMS: Facial and Oral Movements Muscles of Facial Expression: None, normal Lips and Perioral Area: None, normal Jaw: None, normal Tongue: None, normal,Extremity Movements Upper (arms, wrists, hands, fingers): None, normal Lower (legs, knees, ankles, toes): None, normal, Trunk Movements Neck, shoulders, hips: None, normal, Overall Severity Severity of abnormal movements (highest score from questions above): None, normal Incapacitation due to abnormal movements: None, normal Patient's awareness of abnormal movements (rate only patient's report): No Awareness, Dental Status Current problems with teeth and/or dentures?: No Does patient usually wear dentures?: No  CIWA:  CIWA-Ar Total: 1 COWS:  COWS Total Score: 2  Treatment Plan Summary: Daily contact with patient to assess and evaluate symptoms and progress in treatment Medication management  Plan: 1. Continue crisis management and stabilization. 2. Continue medication management by assessing side effects, dose modification as needed and therapeutic blood levels as indicated. 3. Treat health problems as indicated or consult IM as needed. 4. Continue treatment plan to decrease risk of relapse upon discharge and to reduce the need for readmission to incorporate the use of local resources for support. 5. Psycho-social education regarding relapse prevention through good self care to incorporate diet, exercise, stress reduction, good sleep hygiene, and reduction in external risk factors such as alcohol, tobacco, and substance abuse. 6. Gain consent for contact with family, PCP, or outside health  provider as needed. 7. Review home medications as needed. 8. Patient is encouraged to attend groups.  Continue Seroquel XR 900mg   mg PO Qhs  Dilantin 200 mg qhs Continue  amitriptyline 300 mg at bedtime    Medical Decision Making Problem Points:  Established problem, worsening (2), New problem, with no additional work-up planned (3), Review of last therapy session (1) and Review of psycho-social stressors (1) Data Points:  Review or order clinical lab tests (1) Review or order medicine tests (1) Review of medication regiment & side effects (2) Review of new medications or change in dosage (2)   Reviewed the information documented and agree with the treatment plan.  Knox Royalty PMHNP 05/15/2014 3:40 PM

## 2014-05-15 NOTE — B[facility] Group Notes (Signed)
[place] Group Notes:  (Nursing/MHT/Case Management/Adjunct)  Date:  05/15/2014  Time:  11:26 AM  Type of Therapy:  Psychoeducational Skills  Participation Level:  Active  Participation Quality:  Appropriate  Affect:  Appropriate  Cognitive:  Appropriate  Insight:  Appropriate  Engagement in Group:  Engaged  Modes of Intervention:  Discussion  Summary of Progress/Problems: Pt did attend self inventory group, pt reported that he was negative SI/HI, no AH/VH noted. Pt rated his depression as a 3, and his helplessness/hopelessness as a 0.     Pt reported no concerns.   Payden Docter Shanta Torin Whisner 05/15/2014, 11:26 AM

## 2014-05-15 NOTE — Progress Notes (Signed)
Adult Psychoeducational Group Note  Date:  05/15/2014 Time:  4:54 PM  Group Topic/Focus:  Recovery Goals:   The focus of this group is to identify appropriate goals for recovery and establish a plan to achieve them.  Participation Level:  Active  Participation Quality:  Appropriate  Affect:  Blunted  Cognitive:  Appropriate  Insight: Appropriate  Engagement in Group:  Engaged  Modes of Intervention:  Education  Additional Comments:  Patient stated his goal for when he leaves is to stay on his medication and go to [place]. Patient stated he would like to try more things to get out of the house. Patient stated he would like to hang out with his friends, join the [employer]. Patient stated he has been participating in the Moral Monday marches in [place] and would like to continue this.  Oralia Manis 05/15/2014, 4:54 PM

## 2014-05-15 NOTE — BHH Group Notes (Signed)
[place] Group Notes:  (Clinical Social Work)  05/15/2014   1:15-2:15PM  Summary of Progress/Problems:   The main focus of today's process group was for the patient to identify ways in which they have sabotaged their own mental health wellness/recovery.  Motivational interviewing and a handout were used to explore the benefits and costs of their self-sabotaging behavior as well as the benefits and costs of changing this behavior.  The Stages of Change were explained to the group using a handout, and patients identified where they are with regard to changing self-defeating behaviors.  The patient expressed he self-sabotages with isolating himself and ruminating about the future.  Type of Therapy:  Process Group  Participation Level:  Minimal  Participation Quality:  Inattentive  Affect:  Depressed and Flat  Cognitive:  Oriented  Insight:  Improving  Engagement in Therapy:  Limited  Modes of Intervention:  Education, Motivational Interviewing   Selmer Dominion, LCSW 05/15/2014, 4:00pm

## 2014-05-15 NOTE — Progress Notes (Signed)
[name] is OOB UAL on the [facility] hall today...he tolerates this fairly well. HE remains flat, sad and distant. A   He attends his groups, is engaged in his poc as evidenced by completing his daily self inventory  And writing on it  He denies SI within the past 24 hrs, he rated his depression and hopelessness "5/3", respectively and stated his DC plan is to " clean" when he gets home.R Safety is in place and poc moves forward.

## 2014-05-16 MED ORDER — RAMELTEON 8 MG PO TABS
8.0000 mg | ORAL_TABLET | Freq: Every day | ORAL | Status: DC
  Administered 2014-05-16: 8 mg via ORAL
  Filled 2014-05-16 (×4): qty 1
  Filled 2014-05-16: qty 14

## 2014-05-16 NOTE — Progress Notes (Signed)
Adult Psychoeducational Group Note  Date:  05/16/2014 Time:  11:20 PM  Group Topic/Focus:  Wrap-Up Group:   The focus of this group is to help patients review their daily goal of treatment and discuss progress on daily workbooks.  Participation Level:  Active  Participation Quality:  Appropriate  Affect:  Appropriate  Cognitive:  Appropriate  Insight: Appropriate  Engagement in Group:  Engaged  Modes of Intervention:  Discussion  Additional Comments: The patient expressed that he learned in group about support groups.The patient said that he is looking forward to getting  Support from Doctor ,Animator and church.  [name] 05/16/2014, 11:20 PM

## 2014-05-16 NOTE — B[facility] Group Notes (Signed)
[place] Group Notes:  (Clinical Social Work)  05/16/2014   1:15-2:15PM  Summary of Progress/Problems: The main focus of today's process group was to decide on additional supports that can be put in place to help patients remain mentally healthy and in the community. There was an extensive discussion about what constitutes a healthy support versus an unhealthy support. An emphasis was placed on using counselor, doctor, therapy groups, 12-step groups, and problem-specific support groups to expand supports. The patient expressed that he currently sees a therapist and doctor, and that he needs to go to a support group; however, he states he needs help deciding which kind.  He went to several depression support groups, did not feel comfortable because he felt those people had real lives, jobs, families and he is unable to have those things.  Felt he needs to be around people he may have more in common with.  CSW suggested possibly going to a men's group.   Type of Therapy:  Process Group  Participation Level:  Active  Participation Quality:  Attentive and Sharing  Affect:  Blunted and Depressed  Cognitive:  Appropriate and Oriented  Insight:  Developing/Improving  Engagement in Therapy:  Engaged  Modes of Intervention:  Education,  Support and [employer], LCSW 05/16/2014, 4:00pm

## 2014-05-16 NOTE — Progress Notes (Signed)
Psychoeducational Group Note  Date: 05/16/2014 Time: 0930 Group Topic/Focus:  Gratefulness:  The focus of this group is to help patients identify what two things they are most grateful for in their lives. What helps ground them and to center them on their work to their recovery.  Participation Level: Did not attend [name]

## 2014-05-16 NOTE — Progress Notes (Signed)
Psychoeducational Group Note  Date:  05/16/2014 Time:  1015  Group Topic/Focus:  Making Healthy Choices:   The focus of this group is to help patients identify negative/unhealthy choices they were using prior to admission and identify positive/healthier coping strategies to replace them upon discharge.  Participation Level:  Did not attend Paulino Rily 05/16/2014

## 2014-05-16 NOTE — Progress Notes (Signed)
Writer has observed patient up in the dayroom watching tv and interacting appropriately. He reports that he feels better since being admitted here. He is aware of his hs medicines and feel they are working. He denies si/hi/a/v hallucinations. Support and encouragement given. Safety maintained on unit with 15 min checks.

## 2014-05-16 NOTE — Progress Notes (Addendum)
Patient ID: [name], male   DOB: 03-09-1956, 58 y.o.   MRN: [id number] [facility] MD Progress Note  05/16/2014 12:01 PM [name]  MRN:  [id number]  Subjective:    He denies SI/HI and has no AVH. He rates his depression as a 2/10, Anxiety 4/10.  He report a continued  state of "numbness", he tells me he doesn't feel depressed or sad  He simply doesn't feel anything at all. He tells me he knows no joy. Speaks to his dysfunctional family specific to his two brothers.  Parents are both deceased.  He tells me he nevere felt loved and cannot feel love for others.  He wishes he could "be different" He worries about his dc plan.    Diagnosis:   DSM5: Schizophrenia Disorders:  Schizophrenia (295.7) Obsessive-Compulsive Disorders:   Trauma-Stressor Disorders:   Substance/Addictive Disorders:   Depressive Disorders:   Total Time spent with patient: 30 minutes  Axis I: Schizoaffective Disorder  ADL's:  Impaired  Sleep: Fair, difficulty falling asleep but once aslepp he sleeps well   Appetite:  Fair  Suicidal Ideation:  Denies SI  Homicidal Ideation:  denied AEB (as evidenced by):  Psychiatric Specialty Exam: Physical Exam  Constitutional: He is oriented to person, place, and time. He appears well-developed and well-nourished.  HENT:  Head: Normocephalic.  Neck: Normal range of motion. Neck supple.  Respiratory: Effort normal.  Musculoskeletal: Normal range of motion.  Neurological: He is alert and oriented to person, place, and time.  Skin: Skin is warm and dry.    ROS  Blood pressure 95/59, pulse 85, temperature 97.6 F (36.4 C), temperature source Oral, resp. rate 20, height 5\' 11"  (1.803 m), weight 91.173 kg (201 lb), SpO2 100.00%.Body mass index is 28.05 kg/(m^2).  General Appearance: Guarded  Eye Contact::  Good  Speech:  Clear and Coherent  Volume:  Decreased  Mood:  Anxious and Depressed  Affect:  Depressed and Flat  Thought Process:  Coherent and Goal Directed   Orientation:  Full (Time, Place, and Person)  Thought Content:  Obsessions, Paranoid Ideation and Rumination  Suicidal Thoughts:  Denies presently  Homicidal Thoughts:  No  Memory:  Immediate;   Fair  Judgement:  Impaired  Insight:  Lacking  Psychomotor Activity:  Psychomotor Retardation  Concentration:  Fair  Recall:  [place]: Fair  Akathisia:  NA  Handed:  Right  AIMS (if indicated):     Assets:  Communication Skills Desire for Improvement Physical Health Resilience Social Support Talents/Skills Transportation  Sleep:  Number of Hours: 6.5   Musculoskeletal: Strength & Muscle Tone: within normal limits Gait & Station: normal Patient leans: N/A  Current Medications: Current Facility-Administered Medications  Medication Dose Route Frequency Provider Last Rate Last Dose  . acetaminophen (TYLENOL) tablet 650 mg  650 mg Oral Q6H PRN Elmarie Shiley, NP   650 mg at 05/10/14 1849  . alum & mag hydroxide-simeth (MAALOX/MYLANTA) 200-200-20 MG/5ML suspension 30 mL  30 mL Oral Q4H PRN Elmarie Shiley, NP      . amitriptyline (ELAVIL) tablet 150 mg  150 mg Oral QHS Elmarie Shiley, NP   150 mg at 05/11/14 2312  . clonazePAM (KLONOPIN) tablet 1 mg  1 mg Oral BID Elmarie Shiley, NP   1 mg at 05/12/14 0800  . magnesium hydroxide (MILK OF MAGNESIA) suspension 30 mL  30 mL Oral Daily PRN Elmarie Shiley, NP      . metoprolol succinate (TOPROL-XL) 24 hr  tablet 100 mg  100 mg Oral QHS Benjamine Mola, FNP   100 mg at 05/11/14 2200  . OLANZapine zydis (ZYPREXA) disintegrating tablet 10 mg  10 mg Oral Q8H PRN Durward Parcel, MD      . phenytoin (DILANTIN) ER capsule 200 mg  200 mg Oral QHS Elmarie Shiley, NP   200 mg at 05/11/14 2311  . QUEtiapine (SEROQUEL) tablet 800 mg  800 mg Oral QHS Dara Hoyer, PA-C   800 mg at 05/11/14 2311  . simvastatin (ZOCOR) tablet 40 mg  40 mg Oral q1800 Elmarie Shiley, NP   40 mg at 05/11/14 1703  . traZODone (DESYREL) tablet 100 mg  100 mg  Oral QHS PRN Elmarie Shiley, NP        Lab Results:  Results for orders placed during the hospital encounter of 05/08/14 (from the past 48 hour(s))  TSH     Status: None   Collection Time    05/12/14  7:45 PM      Result Value Ref Range   TSH 2.970  0.350 - 4.500 uIU/mL   Comment: Please note change in reference range.     Performed at [facility]    Physical Findings: AIMS: Facial and Oral Movements Muscles of Facial Expression: None, normal Lips and Perioral Area: None, normal Jaw: None, normal Tongue: None, normal,Extremity Movements Upper (arms, wrists, hands, fingers): None, normal Lower (legs, knees, ankles, toes): None, normal, Trunk Movements Neck, shoulders, hips: None, normal, Overall Severity Severity of abnormal movements (highest score from questions above): None, normal Incapacitation due to abnormal movements: None, normal Patient's awareness of abnormal movements (rate only patient's report): No Awareness, Dental Status Current problems with teeth and/or dentures?: No Does patient usually wear dentures?: No  CIWA:  CIWA-Ar Total: 1 COWS:  COWS Total Score: 2  Treatment Plan Summary: Daily contact with patient to assess and evaluate symptoms and progress in treatment Medication management  Plan: 1. Continue crisis management and stabilization. 2. Continue medication management by assessing side effects, dose modification as needed and therapeutic blood levels as indicated. 3. Treat health problems as indicated or consult IM as needed. 4. Continue treatment plan to decrease risk of relapse upon discharge and to reduce the need for readmission to incorporate the use of local resources for support. 5. Psycho-social education regarding relapse prevention through good self care to incorporate diet, exercise, stress reduction, good sleep hygiene, and reduction in external risk factors such as alcohol, tobacco, and substance abuse. 6. Gain consent for contact  with family, PCP, or outside health provider as needed. 7. Review home medications as needed. 8. Patient is encouraged to attend groups.  Continue Seroquel XR 900mg   mg PO Qhs  Dilantin 200 mg qhs Continue  amitriptyline 300 mg at bedtime    Medical Decision Making Problem Points:  Established problem, worsening (2), New problem, with no additional work-up planned (3), Review of last therapy session (1) and Review of psycho-social stressors (1) Data Points:  Review or order clinical lab tests (1) Review or order medicine tests (1) Review of medication regiment & side effects (2) Review of new medications or change in dosage (2)   Reviewed the information documented and agree with the treatment plan.  Knox Royalty PMHNP 05/16/2014 12:01 PM

## 2014-05-16 NOTE — Social Work (Signed)
D Berry is OOB UAL on the [address] hall..he keep to himslef, takes his meds as ordered but does not talk to other patietns.   A HE maintaisn a sad, flat affect. HE admits to this nurse that he " does not feel anything...Marland KitchenMarland KitchenMarland Kitchenever". He completes his self inventory and on it he writes he deneis SI within the past 24 hrs, he rates his depression and hopelessness "3/3" and says his DC plan is to " go to a THERAPIST, [facility] [employer]".    R Safety is in [place] moves forward.

## 2014-05-17 MED ORDER — PHENYTOIN 50 MG PO CHEW
200.0000 mg | CHEWABLE_TABLET | Freq: Every day | ORAL | Status: DC
  Filled 2014-05-17: qty 4
  Filled 2014-05-17: qty 56

## 2014-05-17 MED ORDER — TESTOSTERONE CYPIONATE 200 MG/ML IM SOLN: 400.0000 mg | INTRAMUSCULAR | Status: DC

## 2014-05-17 MED ORDER — AMITRIPTYLINE HCL 150 MG PO TABS: 300.0000 mg | ORAL_TABLET | Freq: Every day | ORAL | Status: DC

## 2014-05-17 MED ORDER — PHENYTOIN 50 MG PO CHEW: 200.0000 mg | CHEWABLE_TABLET | Freq: Every day | ORAL | Status: DC

## 2014-05-17 MED ORDER — OMEPRAZOLE 20 MG PO CPDR: 20.0000 mg | DELAYED_RELEASE_CAPSULE | Freq: Every day | ORAL | Status: DC | PRN

## 2014-05-17 MED ORDER — RAMELTEON 8 MG PO TABS: 8.0000 mg | ORAL_TABLET | Freq: Every day | ORAL | Status: DC

## 2014-05-17 MED ORDER — METOPROLOL SUCCINATE ER 100 MG PO TB24: 100.0000 mg | ORAL_TABLET | Freq: Every day | ORAL | Status: DC

## 2014-05-17 MED ORDER — PHENYTOIN 50 MG PO CHEW
200.0000 mg | CHEWABLE_TABLET | Freq: Every day | ORAL | Status: DC
  Filled 2014-05-17 (×2): qty 4

## 2014-05-17 MED ORDER — QUETIAPINE FUMARATE 300 MG PO TABS: 900.0000 mg | ORAL_TABLET | Freq: Every day | ORAL | Status: DC

## 2014-05-17 MED ORDER — SIMVASTATIN 40 MG PO TABS: 40.0000 mg | ORAL_TABLET | Freq: Every day | ORAL | Status: DC

## 2014-05-17 MED ORDER — CLONAZEPAM 1 MG PO TABS: 1.0000 mg | ORAL_TABLET | Freq: Two times a day (BID) | ORAL | Status: DC

## 2014-05-17 NOTE — Progress Notes (Signed)
Patient ID: [name], male   DOB: December 03, 1956, 58 y.o.   MRN: [id number] D: Pt. Reports "ready to go home" denies SHI. Pt. Calm, pleasant. A: Writer assessed for s/s of distress, provided emotional support, reviewed medications, items removed and reviewed from locker. Pt. Confirms items. R: no distress noted. Pt. Discharged with [facility] transport.

## 2014-05-17 NOTE — BHH Suicide Risk Assessment (Signed)
   Demographic Factors:  Male, Adolescent or young adult, Caucasian, Low socioeconomic status, Living alone and Unemployed  Total Time spent with patient: 30 minutes  Psychiatric Specialty Exam: Physical Exam  ROS  Blood pressure 138/89, pulse 92, temperature 98.1 F (36.7 C), temperature source Oral, resp. rate 18, height 5\' 11"  (1.803 m), weight 91.627 kg (202 lb), SpO2 100.00%.Body mass index is 28.19 kg/(m^2).  General Appearance: Casual  Eye Contact::  Good  Speech:  Clear and Coherent  Volume:  Normal  Mood:  Depressed  Affect:  Appropriate and Congruent  Thought Process:  Coherent and Goal Directed  Orientation:  Full (Time, Place, and Person)  Thought Content:  WDL  Suicidal Thoughts:  No  Homicidal Thoughts:  No  Memory:  Immediate;   Fair  Judgement:  Intact  Insight:  Good  Psychomotor Activity:  Normal  Concentration:  Good  Recall:  Good  Fund of Knowledge:Good  Language: Good  Akathisia:  NA  Handed:  Right  AIMS (if indicated):     Assets:  Communication Skills Desire for Improvement Leisure Time Physical Health Resilience Social Support Talents/Skills  Sleep:  Number of Hours: 6.75    Musculoskeletal: Strength & Muscle Tone: within normal limits Gait & Station: normal Patient leans: N/A   Mental Status Per Nursing Assessment::   On Admission:  NA (denies si/hi/av. )  Current Mental Status by Physician: NA  Loss Factors: Financial problems/change in socioeconomic status  Historical Factors: NA  Risk Reduction Factors:   Sense of responsibility to family, Religious beliefs about death, Positive therapeutic relationship and Positive coping skills or problem solving skills  Continued Clinical Symptoms:  Schizophrenia:   Depressive state  Cognitive Features That Contribute To Risk:  Polarized thinking    Suicide Risk:  Minimal: No identifiable suicidal ideation.  Patients presenting with no risk factors but with morbid ruminations; may  be classified as minimal risk based on the severity of the depressive symptoms  Discharge Diagnoses:   AXIS I:  Schizoaffective Disorder AXIS II:  Deferred AXIS III:   Past Medical History  Diagnosis Date  . Seizures   . Hypertension   . GERD (gastroesophageal reflux disease)   . Dyslipidemia   . Schizoaffective disorder   . Depression   . Anxiety   . MRSA carrier    AXIS IV:  economic problems, occupational problems, other psychosocial or environmental problems, problems related to social environment and problems with primary support group AXIS V:  51-60 moderate symptoms  Plan Of Care/Follow-up recommendations:  Activity:  As tolerated Diet:  Regular  Is patient on multiple antipsychotic therapies at discharge:  No   Has Patient had three or more failed trials of antipsychotic monotherapy by history:  No  Recommended Plan for Multiple Antipsychotic Therapies: NA    Janardhaha R Emre Stock 05/17/2014, 1:07 PM

## 2014-05-17 NOTE — BHH Group Notes (Signed)
[facility] LCSW Aftercare Discharge Planning Group Note   05/17/2014 9:36 AM    Participation Quality:  Appropraite  Mood/Affect:  Appropriate  Depression Rating:  2  Anxiety Rating:  0  Thoughts of Suicide:  No  Will you contract for safety?   NA  Current AVH:  No  Plan for Discharge/Comments:  Patient attended discharge planning group and actively participated in group.  He will follow up with [name] and return to his home.  CSW provided all participants with daily workbook.   Transportation Means: Patient has transportation.   Supports:  Patient has a support system.   Larayah Clute, [name]

## 2014-05-17 NOTE — Progress Notes (Signed)
Pt attended spiritual care group on grief and loss facilitated by chaplain Jerene Pitch.  Group opened with brief discussion and psycho-social ed around grief and loss in relationships and in relation to self. Group identified life patterns, circumstances, changes that cause losses. Established group norm of speaking from own life experience. Group goal of establishing open and affirming space for members to share loss and experience with grief, normalize grief experience and provide psycho social education and grief support, identify ways illness intersects with grief.    [name] was late arriving to group.  He was attentive throughout group, though he did not engage with other members.   [name]

## 2014-05-17 NOTE — Clinical Social Work Note (Signed)
Per Treatment Team recommendation, referral made to [facility] Team.  Writer spoke with Katrinka Blazing who requested copy of treatment team note recommending referral.  Note faxed to Mickel Baas as requested.

## 2014-05-17 NOTE — Tx Team (Addendum)
Interdisciplinary Treatment Plan Update   Date Reviewed:  05/17/2014  Time Reviewed:  10:00 AM  Progress in Treatment:   Attending groups: Yes Participating in groups: Yes Taking medication as prescribed: Yes  Tolerating medication: Yes Family/Significant other contact made:  No, patient declined collateral contact Patient understands diagnosis: Yes  Discussing patient identified problems/goals with staff: Yes Medical problems stabilized or resolved: Yes Denies suicidal/homicidal ideation: Yes Patient has not harmed self or others: Yes  For review of initial/current patient goals, please see plan of care.  Estimated Length of Stay:  Discharge today  Reasons for Continued Hospitalization:   New Problems/Goals identified:    Discharge Plan or Barriers:   Home with outpatient follow up with [name].  Referral to be made to [facility] Team.  Additional Comments:   Attendees:  Patient:  [name] 05/17/2014 10:00 AM   Signature: Mylinda Latina, MD 05/17/2014 10:00 AM  Signature:  Nena Polio, PA 05/17/2014 10:00 AM  Signature:  Clinton Sawyer, RN 05/17/2014 10:00 AM  Signature:Beverly Danelle Earthly, RN 05/17/2014 10:00 AM  Signature:  05/17/2014 10:00 AM  Signature:  Joette Catching, LCSW 05/17/2014 10:00 AM  Signature:  Regan Lemming, LCSW 05/17/2014 10:00 AM  Signature:  Lucinda Dell, Care Coordinator [facility] 05/17/2014 10:00 AM  Signature:   05/17/2014 10:00 AM  Signature:  05/17/2014  10:00 AM  Signature:   Lars Pinks, RN URCM 05/17/2014  10:00 AM  Signature: 05/17/2014  10:00 AM    Scribe for Treatment Team:   Joette Catching,  05/17/2014 10:00 AM

## 2014-05-17 NOTE — BHH Group Notes (Signed)
[place] LCSW Group Therapy          Overcoming Obstacles       1:15 -2:30        05/17/2014       Type of Therapy:  Group Therapy  Participation Level:  Appropriate  Participation Quality:  Appropriate  Affect:  Appropriate, Alert  Cognitive:  Attentive Appropriate  Insight: Developing/Improving Engaged  Engagement in Therapy: Developing/Imprvoing Engaged  Modes of Intervention:  Discussion Exploration  Education Rapport BuildingProblem-Solving Support  Summary of Progress/Problems:  The main focus of today's group was overcoming obstacles. He advised the obstacle he has to overcome is isolating himself. Patient able to identify appropriate coping skills including joining the [facility].   Concha Pyo 05/17/2014

## 2014-05-17 NOTE — Progress Notes (Signed)
Writer has observed patient up in the dayroom watching tv and interacting appropriately with peers. He reports that he has had a good day and feels better since coming in. He is compliant with his medications. He denies si/hi/a/v hallucinations. He attended group this evening and participated. Safety maintained on unit with 15 min checks.
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
## 2014-05-18 LAB — AMITRIPTYLINE LEVEL
Amitrip+Nortrip: 120 mcg/L (ref 100–250)
Amitriptyline: 77 mcg/L
Nortriptyline-AMITR: 43 mcg/L

## 2014-05-19 ENCOUNTER — Encounter: Payer: Self-pay | Admitting: Emergency Medicine

## 2014-05-19 ENCOUNTER — Ambulatory Visit (INDEPENDENT_AMBULATORY_CARE_PROVIDER_SITE_OTHER): Payer: Medicaid Other | Admitting: Emergency Medicine

## 2014-05-19 VITALS — BP 133/87 | HR 90 | Temp 98.4°F | Wt 203.3 lb

## 2014-05-19 DIAGNOSIS — F259 Schizoaffective disorder, unspecified: Secondary | ICD-10-CM

## 2014-05-19 DIAGNOSIS — R7309 Other abnormal glucose: Secondary | ICD-10-CM

## 2014-05-19 DIAGNOSIS — M7500 Adhesive capsulitis of unspecified shoulder: Secondary | ICD-10-CM

## 2014-05-19 DIAGNOSIS — I10 Essential (primary) hypertension: Secondary | ICD-10-CM

## 2014-05-19 DIAGNOSIS — Z8669 Personal history of other diseases of the nervous system and sense organs: Secondary | ICD-10-CM

## 2014-05-19 LAB — AMITRIPTYLINE LEVEL
Amitrip+Nortrip: 243 mcg/L (ref 100–250)
Amitriptyline: 153 mcg/L
Nortriptyline-AMITR: 90 mcg/L

## 2014-05-19 LAB — POCT GLYCOSYLATED HEMOGLOBIN (HGB A1C): Hemoglobin A1C: 5.3

## 2014-05-19 MED ORDER — RAMELTEON 8 MG PO TABS: 8.0000 mg | ORAL_TABLET | Freq: Every day | ORAL | Status: DC

## 2014-05-19 MED ORDER — PHENYTOIN 50 MG PO CHEW: 200.0000 mg | CHEWABLE_TABLET | Freq: Every day | ORAL | Status: DC

## 2014-05-19 MED ORDER — OMEPRAZOLE 20 MG PO CPDR: 20.0000 mg | DELAYED_RELEASE_CAPSULE | Freq: Every day | ORAL | Status: DC | PRN

## 2014-05-19 MED ORDER — CLONAZEPAM 2 MG PO TABS: 5.0000 mg | ORAL_TABLET | Freq: Every day | ORAL | Status: DC

## 2014-05-19 MED ORDER — METOPROLOL SUCCINATE ER 100 MG PO TB24: 100.0000 mg | ORAL_TABLET | Freq: Every day | ORAL | Status: DC

## 2014-05-19 MED ORDER — SIMVASTATIN 40 MG PO TABS: 40.0000 mg | ORAL_TABLET | Freq: Every day | ORAL | Status: DC

## 2014-05-19 MED ORDER — AMITRIPTYLINE HCL 150 MG PO TABS: 300.0000 mg | ORAL_TABLET | Freq: Every day | ORAL | Status: DC

## 2014-05-19 MED ORDER — QUETIAPINE FUMARATE 300 MG PO TABS: 900.0000 mg | ORAL_TABLET | Freq: Every day | ORAL | Status: DC

## 2014-05-19 NOTE — Assessment & Plan Note (Signed)
He is now just on metoprolol succinate 100 mg daily. Agree with this medication change. Followup in 3 months.

## 2014-05-19 NOTE — Patient Instructions (Signed)
It was nice to see you!  I have refilled all your medications.  I will fill out the paperwork by next Friday.  You will get a call when it is ready for you to pick up.  Follow up in 3 months or sooner as needed.

## 2014-05-19 NOTE — Assessment & Plan Note (Signed)
Based on my interaction with him today it appears the changes in his medications have been beneficial. Continue the amitriptyline 300 mg daily, Seroquel 900 mg each bedtime.

## 2014-05-19 NOTE — Assessment & Plan Note (Signed)
Last seizure was in October 2014. He is now on phenytoin 200 mg each bedtime. I have put his Klonopin back to 5 mg each bedtime. Continue to monitor for seizure activity. If he has additional seizures, will need to rerefer to neurology. He dropped off [employer] paperwork, I will fill this out and call the patient when it is ready to be picked up some time next week.

## 2014-05-19 NOTE — Progress Notes (Signed)
   Subjective:    Patient ID: [name], male    DOB: 07/22/1956, 57 y.o.   MRN: [id number]  HPI [name] is here for medication refill.  He was just discharged from behavioral health 2 days ago. He is here primarily to discuss his Clonopin. He had previously been on Klonopin 5 mg twice a day for seizure prophylaxis. During his admission day decrease this to 1 mg twice a day. He has continued to take 5 mg at bedtime only since his discharge.  Other than this medication change, he is very pleased with the changes in his medicines made during his admission.  Current Outpatient Prescriptions on File Prior to Visit  Medication Sig Dispense Refill  . amitriptyline (ELAVIL) 150 MG tablet Take 2 tablets (300 mg total) by mouth at bedtime.  60 tablet  0  . metoprolol succinate (TOPROL-XL) 100 MG 24 hr tablet Take 1 tablet (100 mg total) by mouth at bedtime. Take with or immediately following a meal.  30 tablet  0  . omeprazole (PRILOSEC) 20 MG capsule Take 1 capsule (20 mg total) by mouth daily as needed. For heartburn or acid reflux.      . phenytoin (DILANTIN) 50 MG tablet Chew 4 tablets (200 mg total) by mouth at bedtime.      . polyethylene glycol (MIRALAX / GLYCOLAX) packet Take 17 g by mouth daily as needed. For constipation. Mix into 8 ounces of fluid & drink  30 each  6  . QUEtiapine (SEROQUEL) 300 MG tablet Take 3 tablets (900 mg total) by mouth at bedtime. Takes 3 tabs QHS  90 tablet  0  . simvastatin (ZOCOR) 40 MG tablet Take 1 tablet (40 mg total) by mouth at bedtime.  30 tablet    . testosterone cypionate (DEPOTESTOTERONE CYPIONATE) 200 MG/ML injection Inject 2 mLs (400 mg total) into the muscle every 14 (fourteen) days.  10 mL  0   No current facility-administered medications on file prior to visit.    I have reviewed and updated the following as appropriate: allergies and current medications SHx: non smoker  Review of Systems See HPI    Objective:   Physical Exam BP  133/87  Pulse 90  Temp(Src) 98.4 F (36.9 C) (Oral)  Wt 203 lb 4.8 oz (92.216 kg) Gen: alert, cooperative, NAD, more alert and upbeat that previously       Assessment & Plan:

## 2014-05-19 NOTE — Assessment & Plan Note (Signed)
This is much improved. Recommended continued range of motion exercises.

## 2014-05-20 ENCOUNTER — Ambulatory Visit (INDEPENDENT_AMBULATORY_CARE_PROVIDER_SITE_OTHER): Payer: Medicaid Other | Admitting: Emergency Medicine

## 2014-05-20 ENCOUNTER — Telehealth: Payer: Self-pay | Admitting: Emergency Medicine

## 2014-05-20 DIAGNOSIS — Z8669 Personal history of other diseases of the nervous system and sense organs: Secondary | ICD-10-CM

## 2014-05-20 NOTE — Telephone Encounter (Signed)
Pt called because his pharmacy told him that he needs a prior authorization form filled out before they can fill the prescription of Rozerem. jw

## 2014-05-20 NOTE — Progress Notes (Signed)
Patient ID: HUGHIE MELROY, male   DOB: 08/24/1956, 58 y.o.   MRN: [id number] S: Mr. Whiteley came by the office today with questions about his Klonopin prescription. Given his questions he was added to my schedule. He is concerned about the dose of his Klonopin. He had previously been on Klonopin 5 mg twice a day. During his admission to behavioral health from 5/23 - 6/1, this was changed to 1 mg twice a day. He also states that prior to this admission he had been taking his a.m. and p.m. doses together in the evening. Prior to this admission, he had been quite concerned about being over sedated. Both myself and other providers who evaluated him were also concerned about oversedation. He states he is on the Klonopin for seizure disorder. He is also on phenytoin 200 mg daily for seizures. Dilantin level was checked in February of this year and was therapeutic at 19.4. His last seizure was in October 2014. He reports a history of being a "fast metabolizer." He was last evaluated by a neurologist in February of 2013, at that time his Klonopin dose was 5 mg twice daily and phenytoin 200 mg daily.  O:  Patient is alert and oriented. He is well-appearing.  A: Seizure disorder - see problem list for specific plan.  I spent 15 minutes with the patient, > 50% spent in counseling the patient.

## 2014-05-20 NOTE — Progress Notes (Signed)
Patient Discharge Instructions:  After Visit Summary (AVS):   Faxed to:  05/20/14 Discharge Summary Note:   Faxed to:  05/20/14 Psychiatric Admission Assessment Note:   Faxed to:  05/20/14 Suicide Risk Assessment - Discharge Assessment:   Faxed to:  05/20/14 Faxed/Sent to the Next Level Care provider:  05/20/14 Faxed to [name] @ [place], 05/20/2014, 2:36 PM

## 2014-05-20 NOTE — Assessment & Plan Note (Signed)
Last seizure was in October 2014. Medication has not changed (prior to 2 weeks ago) for at least 2 years. However there is some concern about how he was taking this medication. I discussed extensively with the patient that I am concerned about putting him back on the 5 mg twice a day given that his sedation is so improved on the lower dose. We also discussed that he has only been on 1 mg twice a day since his admission to behavioral health on 5/23.  Given the half-life of Klonopin, he should have reached a new steady state by the time of his discharge on June 1, especially if he is a fast metabolizer. I would expect that if he was going to have seizures on the lower dose, he would have already had one. He will continue Dilantin 200 mg daily. At this time, we have left the Klonopin at 5 mg at bedtime. I also placed a referral to neurology, given that he has not seen a neurologist in 2 years. He will followup with me in 2-3 weeks.

## 2014-05-21 NOTE — Telephone Encounter (Signed)
Pt called stating prior authorization is needed for Rozerem. Formulary and PA form placed in provider box for completion. Derl Barrow, RN

## 2014-05-25 NOTE — Telephone Encounter (Signed)
PA form completed and returned to [facility].

## 2014-05-25 NOTE — Telephone Encounter (Addendum)
PA for Rozerem is pending per [place] [employer].  Confirmation number is [id number] W. Derl Barrow, RN  Received PA approval for Rozerem via [employer].  Med approved for 05/25/2014 - 11/21/2014.  [facility] pharmacy informed.  PA confirmation number [id number] W. Derl Barrow, RN

## 2014-06-24 ENCOUNTER — Encounter: Payer: Self-pay | Admitting: Family Medicine

## 2014-06-24 ENCOUNTER — Telehealth: Payer: Self-pay | Admitting: Family Medicine

## 2014-06-24 NOTE — Telephone Encounter (Signed)
Forward to PCP for letter to ok dental work

## 2014-06-24 NOTE — Telephone Encounter (Signed)
Pt called because he is being seen at [facility] on 06/29/14. The last visit he had with them he almost passed out because of his BP medication. Since then he seen Dr. Bridgett Larsson and his BP medication dosage was cut in half. [facility] would like a letter from his PCP stating that his medication was cut in half and it will be okay for him to be seen. Please call pt when this is done. Please fax the letter to [phone]. Blima Rich

## 2014-06-24 NOTE — Telephone Encounter (Signed)
Patient has had a stable blood pressure according to his previous visits, so if this was the only concern then he should be all right for dental work. Please call pt and let him know that I will fax a letter saying he can be seen. He needs to schedule a follow up visit with me though, as he last saw Dr. Bridgett Larsson over one month ago and was supposed to follow up in two weeks. Please inform pt.  Leeanne Rio, MD

## 2014-06-25 NOTE — Telephone Encounter (Signed)
Informed pt of faxed form. Pt understood. Will call back to make a f/u appt in 2 weeks.

## 2014-07-07 ENCOUNTER — Encounter: Payer: Self-pay | Admitting: Family Medicine

## 2014-07-07 ENCOUNTER — Ambulatory Visit (INDEPENDENT_AMBULATORY_CARE_PROVIDER_SITE_OTHER): Payer: Medicaid Other | Admitting: Family Medicine

## 2014-07-07 VITALS — BP 129/81 | HR 97 | Temp 99.3°F | Ht 71.0 in | Wt 210.1 lb

## 2014-07-07 DIAGNOSIS — E663 Overweight: Secondary | ICD-10-CM

## 2014-07-07 DIAGNOSIS — M7500 Adhesive capsulitis of unspecified shoulder: Secondary | ICD-10-CM

## 2014-07-07 DIAGNOSIS — M7502 Adhesive capsulitis of left shoulder: Secondary | ICD-10-CM

## 2014-07-07 NOTE — Assessment & Plan Note (Signed)
Continues to improve slightly with exercises, continue doing these to increase range of motion.

## 2014-07-07 NOTE — Assessment & Plan Note (Signed)
Reviewed recent A1c of 5.3 on June 3. Advised that he does NOT have diabetes and therefore does not need to check his sugar at home. He is troubled by his weight gain. Encouraged him to call Dr. Jenne Campus to set up an appointment for nutritional counseling.

## 2014-07-07 NOTE — Progress Notes (Signed)
Patient ID: [name], male   DOB: 05-07-1956, 58 y.o.   MRN: [id number]  HPI:  Pt presents today to meet his new PCP.   1. Weight gain - has gained about 20lbs in the last year. Concerned that he could have diabetes because his brother developed DM after gaining weight. Wants to check his sugars at home.  2. Frozen shoulder - still has some pain in L shoulder. The ROM is improving slightly with exercises. Overall thinks is mildly improved.  3. Wants me to know that his psychiatrist is now providing his klonopin, that we do not need to prescribe this here anymore.  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, major depression, frozen shoulder, HTN, schizoaffective disorder, seizures  PHYSICAL EXAM: BP 129/81  Pulse 97  Temp(Src) 99.3 F (37.4 C) (Oral)  Ht 5\' 11"  (1.803 m)  Wt 210 lb 1.6 oz (95.301 kg)  BMI 29.32 kg/m2 Gen: NAD HEENT: NCAT Heart: RRR, no murmurs Lungs: CTAB, NWOB Neuro: grossly nonfocal, speech normal Psych: normal range of affect, well groomed, speech normal in rate and volume, normal eye contact  Ext: L shoulder with ROM allowing him to abduct, extend and flex the shoulder to 90 degrees each way, no crepitus with movement  ASSESSMENT/PLAN:  See problem based charting for additional assessment/plan.   FOLLOW UP: F/u in 2 months for chronic medical problems.  [place]. Ardelia Mems, [place]

## 2014-07-07 NOTE — Patient Instructions (Signed)
It was nice to meet you today!  For weight management: Call Dr. Jenne Campus (our nutritionist) to set up an appointment. Her phone number is: [phone].   Follow up with me in 2 months for your routine medical problems. Continue exercises for your shoulder.  Be well, Dr. Ardelia Mems

## 2014-08-03 ENCOUNTER — Other Ambulatory Visit: Payer: Self-pay | Admitting: *Deleted

## 2014-08-05 ENCOUNTER — Other Ambulatory Visit: Payer: Self-pay | Admitting: *Deleted

## 2014-08-05 ENCOUNTER — Ambulatory Visit (INDEPENDENT_AMBULATORY_CARE_PROVIDER_SITE_OTHER): Payer: Medicaid Other | Admitting: Family Medicine

## 2014-08-05 ENCOUNTER — Encounter: Payer: Self-pay | Admitting: Family Medicine

## 2014-08-05 DIAGNOSIS — E663 Overweight: Secondary | ICD-10-CM

## 2014-08-05 MED ORDER — AMITRIPTYLINE HCL 150 MG PO TABS: 300.0000 mg | ORAL_TABLET | Freq: Every day | ORAL | Status: DC

## 2014-08-05 MED ORDER — METOPROLOL SUCCINATE ER 100 MG PO TB24: 100.0000 mg | ORAL_TABLET | Freq: Every day | ORAL | Status: DC

## 2014-08-05 NOTE — Patient Instructions (Addendum)
Mr. Feinstein, it was nice seeing you today in nutrition clinic.  Main Points to try and implement prior to next visit: - Frozen Vegetables in bulk quantities (Mushrooms, Onions, Bell Peppers), sometimes broccoli.  Please try to aim for adding 50% of your dinnertime meal consisting of this.  You may add this to your eggplant Parmesan.  - When you eat a meal that includes at least three different components (includes some protein, some fat, some carbohydrate), than this meal is most likely to be most satisfying psychologically and will hold over you appetite the longest.  A meal that is 100% protein or 100% carbohydrate, you will start to get hungry sooner and will not be as satisfied.   - Please try adding one fruit (apple, blueberries, strawberries) to your meal at lunch and dinner.  You may substitute a smoothie with fruit with your meal or you may use this as a snack between lunch and dinner.  - For breakfast, you may continue to eat the eggs and add a small cup of blueberries or strawberries to this to help tide you over until lunch.   - Please try to get yourself to move some during each day (even 10-15 minutes of walking) will help you lose weight.    - Please use the stair master daily for at least 4 minutes and you may increase this as your tolerance increases  Thanks, Dr. Awanda Mink and Dr. Jenne Campus

## 2014-08-05 NOTE — Telephone Encounter (Signed)
2nd request.  Nataly Pacifico L, RN  

## 2014-08-05 NOTE — Progress Notes (Signed)
Mr. Hartshorne is here today for an initial visit at nutrition clinic because he is concerned about his weight gain.    Mr. Mineer states that he is concerned that he has gained over 20+ lbs over the past several months.  Previous weight was around 180 in February of 2014 and has increased to around 190-205 over the last year about now.  He is concerned this weight gain is eventually going to cause MI/CVA/Hyperlipidemia and he can not longer zipper/button his pants.  As well, his belt has gone to the last belt notch.    Usual physical activity includes:  Denies.  States he has decreased motivation 2/2 depression and does not want to leave his bed.  This is a chronic problem and states he has been lethargic/fatigued for over 10+ years.  Last time he remembers exercising is about 15 yrs ago.   Usual eating pattern includes:  Awakes around 10 AM, usually eats within the first hour.  Goes back to sleep (sometimes skips lunch due to sleeping) and will eat lunch around 2-3 PM.  Eats around 5 PM for dinner (tries to take his medications on an empty stomach).  Denies any snacking but does state he has trouble falling asleep.  However, he does state he will snack sometimes throughout the night with something "sugary" to make him more somnolent.   Frequent foods include Eggs w/ sausage (q 4 days), potatoes from box (w/ 1/4 cup olive oil and 1/2 cup of powdered milk)/sometimes pizza for lunch, Frozen meals (including eggplant parmesan) from bulk products.  Oreo cookies (6/serving)/2% milk/Junior Mints(10/serving) at bedtime snack.  As well, high milk intake (2% milk, 2 gallons per week).  Quart/wk of orange juice.  Avoided foods include processed meat.  States that he will eat [employer]/Burger 1 x per week.    24-hr recall: (Up at 9 AM)  B ( AM)- 9:30, 3 eggs w/ olive oil (water)  Snk ( AM)-   N/A L ( PM)-  2 PM, Potatoes from box (3 cups) w/ Olive Oil (1/4 cup)  Snk ( PM)-  4 PM, 1 piece of Whole Wheat Bread w/  butter  D ( PM)-  5 PM, Eggplant parmesan frozen (3 cups) Snk ( PM)-  10 PM, 4 Oreos   Typical or atypical day: Yes.    Barriers to making changes? Depression/Mood Disorder   Handouts given during visit include:  AVS  Demonstrated degree of understanding via:  Teach Back (Eggplant Parmesan w/ addition of vegetables with a piece of fruit     >50% of the 45 minute visit was spent face to face with the patient discussing management options, treatments, and other modalities.

## 2014-08-24 ENCOUNTER — Ambulatory Visit (INDEPENDENT_AMBULATORY_CARE_PROVIDER_SITE_OTHER): Payer: Medicaid Other | Admitting: Family Medicine

## 2014-08-24 ENCOUNTER — Encounter: Payer: Self-pay | Admitting: Family Medicine

## 2014-08-24 VITALS — BP 109/77 | HR 80 | Temp 98.4°F | Ht 71.0 in | Wt 204.5 lb

## 2014-08-24 DIAGNOSIS — M7500 Adhesive capsulitis of unspecified shoulder: Secondary | ICD-10-CM

## 2014-08-24 DIAGNOSIS — M7502 Adhesive capsulitis of left shoulder: Secondary | ICD-10-CM

## 2014-08-24 NOTE — Progress Notes (Signed)
Patient ID: [name], male   DOB: 08-Nov-1956, 58 y.o.   MRN: [id number]  HPI:  Frozen shoulder: limited abduction and flexion in L shoulder since October 2014 when had surgery in shoulder after shoulder injury during seizure. Last physical therapy appointment was a long time ago. Went for 3 sessions but then had to go into [facility]. Has stopped improving lately. Able to drive but just limited ability to abduct the shoulder. Not interested in more formal physical therapy at this time.  ROS: See HPI.  [place]: hx schizoaffective disorder, severe depression, HLD, HTN, overweight, osteopenia, seizures (sz free x over 6 months now)  PHYSICAL EXAM: BP 109/77  Pulse 80  Temp(Src) 98.4 F (36.9 C) (Oral)  Ht 5\' 11"  (1.803 m)  Wt 204 lb 8 oz (92.761 kg)  BMI 28.53 kg/m2 Gen: NAD HEENT: NCAT Heart: RRR Lungs: CTAB Neuro: grossly nonfocal speech normal Ext: L shoulder with diminished active ROM. Unable to flex or abduct shoulder above horizontal. Full strength bilat arms.  ASSESSMENT/PLAN:  Frozen shoulder syndrome Has stopped having improvement in frozen shoulder. Admits to not doing exercises much. Not interested in formal physical therapy at this time. For now will have him restart/continue his home exercises and monitor for improvement.   FOLLOW UP: F/u in 2 days for nutritional counseling in nutrition clinic F/u with me in clinic in 3 mos for chronic medical problems.  [place]. Ardelia Mems, [place]

## 2014-08-24 NOTE — Patient Instructions (Signed)
It was great to see you again today!  I will see you Thursday in nutrition clinic to talk about weight management. Follow up with me in 3 months in the clinic for your medical problems.  For frozen shoulder - keep working on your exercises.  Be well, Dr. Ardelia Mems

## 2014-08-26 ENCOUNTER — Ambulatory Visit (INDEPENDENT_AMBULATORY_CARE_PROVIDER_SITE_OTHER): Payer: Medicaid Other | Admitting: Family Medicine

## 2014-08-26 VITALS — Ht 71.0 in | Wt 205.4 lb

## 2014-08-26 DIAGNOSIS — E663 Overweight: Secondary | ICD-10-CM

## 2014-08-26 NOTE — Progress Notes (Signed)
Patient ID: [name], male   DOB: 1956-05-19, 58 y.o.   MRN: [id number]  Nutrition Clinic Visit  Pt seen in nutrition clinic with Dr. Jenne Campus. Pt presents to discuss nutrition in light of his obesity.  Foods/beverages frequently in diet:  -eats two avocados per day sometimes  Physical activity: went to the gym 3 times since last nutrition clinic visit. Admits to having low motivation.  24-hr recall: (Up at 9 AM) B (9:15 AM)-   3 eggs, two pieces of sausage patties, one pint 2% milk L (1 PM)-   Eggplant parmesan mixed with frozen vegetables, one pint 2% milk D (6 PM)-   Eggplant parmesan and frozen vegetables, one pint 2% milk Water throughout the day  This was a typical day for him.  Recommendations: 1. Decrease avocados to 1/2 an avocado per day. 2. Combine trips to the mall or gym to exercise with times you go out of your house (grocery store, doctors appointments). 3. Goal of exercising 3 times a week. 4. Increase vegetable intake with your eggplant parmesan. 5. Try powdered milk, this will be good to have on hand for if you run out of milk. 6. Write down number of minutes you exercise on your calendar.  Follow up in [facility] clinic with Dr. Ardelia Mems on October 14.  Chrisandra Netters, MD Family Medicine PGY-3   20 minutes of direct face-to-face time was spent with the patient with at least 50% of this time spent counseling the patient.

## 2014-08-26 NOTE — Patient Instructions (Addendum)
Recommendations: 1. Decrease avocados to 1/2 an avocado per day. 2. Combine trips to the mall or gym to exercise with times you go out of your house (grocery store, doctors appointments). 3. Goal of exercising 3 times a week. 4. Increase vegetable intake with your eggplant parmesan. 5. Try powdered milk, this will be good to have on hand for if you run out of milk. 6. Write down number of minutes you exercise on your calendar.

## 2014-08-29 NOTE — Assessment & Plan Note (Signed)
Has stopped having improvement in frozen shoulder. Admits to not doing exercises much. Not interested in formal physical therapy at this time. For now will have him restart/continue his home exercises and monitor for improvement.

## 2014-09-29 ENCOUNTER — Telehealth: Payer: Self-pay | Admitting: Family Medicine

## 2014-09-29 ENCOUNTER — Ambulatory Visit: Payer: Self-pay | Admitting: Family Medicine

## 2014-09-29 DIAGNOSIS — G40909 Epilepsy, unspecified, not intractable, without status epilepticus: Secondary | ICD-10-CM
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
## 2014-09-30 ENCOUNTER — Telehealth: Payer: Self-pay | Admitting: Family Medicine

## 2014-09-30 NOTE — Telephone Encounter (Signed)
Pt called and would like a referral to see a Neurologist for seizures. He is not sure why this happen and doesn't want this to happen again. Please do this as soon as you can since he very upset and afraid that this could happen at home he is lives by himself. Blima Rich

## 2014-10-01 NOTE — Telephone Encounter (Signed)
Will forward to PCP (see previous phone note)

## 2014-10-08 NOTE — Telephone Encounter (Signed)
It appears that his phenytoin dose was therapeutic during his hospitalization and the neurology consultants did not make any changes to his medications while hospitalized. Reviewed notes in Care Everywhere.  I will refer him to neurology. I would recommend to him that he come in for a repeat phenytoin level and office visit if he is able. Please inform patient of the above.  Leeanne Rio, MD

## 2014-10-08 NOTE — Telephone Encounter (Signed)
Patient informed of message from PCP and informed that someone from [facility] would be calling him with appt., he will be in next week for labs.

## 2014-10-13 ENCOUNTER — Ambulatory Visit (INDEPENDENT_AMBULATORY_CARE_PROVIDER_SITE_OTHER): Payer: Medicaid Other | Admitting: Family Medicine

## 2014-10-13 ENCOUNTER — Encounter: Payer: Self-pay | Admitting: Family Medicine

## 2014-10-13 VITALS — BP 106/64 | HR 94 | Temp 98.4°F | Ht 71.0 in | Wt 196.0 lb

## 2014-10-13 DIAGNOSIS — Z79899 Other long term (current) drug therapy: Secondary | ICD-10-CM

## 2014-10-13 DIAGNOSIS — G40909 Epilepsy, unspecified, not intractable, without status epilepticus: Secondary | ICD-10-CM

## 2014-10-13 LAB — COMPREHENSIVE METABOLIC PANEL
ALT: 32 U/L (ref 0–53)
AST: 24 U/L (ref 0–37)
Albumin: 4.4 g/dL (ref 3.5–5.2)
Alkaline Phosphatase: 84 U/L (ref 39–117)
BUN: 12 mg/dL (ref 6–23)
CO2: 25 mEq/L (ref 19–32)
Calcium: 9.1 mg/dL (ref 8.4–10.5)
Chloride: 105 mEq/L (ref 96–112)
Creat: 0.94 mg/dL (ref 0.50–1.35)
Glucose, Bld: 111 mg/dL — ABNORMAL HIGH (ref 70–99)
Potassium: 3.9 mEq/L (ref 3.5–5.3)
Sodium: 138 mEq/L (ref 135–145)
Total Bilirubin: 0.3 mg/dL (ref 0.2–1.2)
Total Protein: 6.6 g/dL (ref 6.0–8.3)
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
## 2014-10-13 NOTE — Patient Instructions (Signed)
Thank you for coming in, today!  We will check your kidney and liver function. We will check the level of Dilantin in your blood. I will call you or send you a letter with the results.  You have an appointment with Dr. [name] on 11/5 at 3 in the afternoon. His office is at [address], [place], [place] [address] Their phone number is [phone].  Come back to see Dr. Ardelia Mems after you see Dr. [name]. I will send her a message so she knows what we did. She and Dr. [name] will be able to see your lab results, as well.  Please feel free to call with any questions or concerns at any time, at [phone]. --Dr. Venetia Maxon

## 2014-10-14 ENCOUNTER — Telehealth: Payer: Self-pay | Admitting: Family Medicine

## 2014-10-14 LAB — PHENYTOIN LEVEL, TOTAL: Phenytoin Lvl: 16.1 ug/mL (ref 10.0–20.0)

## 2014-10-14 NOTE — Telephone Encounter (Signed)
Opened in error

## 2014-10-17 LAB — PHENYTOIN LEVEL, FREE AND TOTAL
Phenytoin Bound: 13 mg/L
Phenytoin, Free: 1.4 mg/L (ref 1.0–2.0)
Phenytoin, Total: 14.4 mg/L (ref 10.0–20.0)

## 2014-10-18 ENCOUNTER — Telehealth: Payer: Self-pay | Admitting: Family Medicine

## 2014-10-18 NOTE — Telephone Encounter (Signed)
Left message on voicemail with message from MD, patient to call back with any questions.

## 2014-10-18 NOTE — Telephone Encounter (Signed)
Red Team: Please call pt to let him know that his phenytoin (Dilantin) levels are normal and he should keep taking his medications without any changes. He should see Dr. [name] on 11/5 at 3 PM. Thanks. --CMS  FYI to Dr. Ardelia Mems (PCP).

## 2014-10-19 ENCOUNTER — Other Ambulatory Visit: Payer: Self-pay | Admitting: *Deleted

## 2014-10-20 ENCOUNTER — Emergency Department (HOSPITAL_COMMUNITY): Payer: Medicaid Other

## 2014-10-20 ENCOUNTER — Inpatient Hospital Stay (HOSPITAL_COMMUNITY)
Admission: EM | Admit: 2014-10-20 | Discharge: 2014-10-25 | DRG: 917 | Disposition: A | Discharge status: Home / Self Care | Payer: Medicaid Other | Attending: Family Medicine | Admitting: Family Medicine

## 2014-10-20 ENCOUNTER — Encounter (HOSPITAL_COMMUNITY): Payer: Self-pay | Admitting: *Deleted

## 2014-10-20 ENCOUNTER — Inpatient Hospital Stay (HOSPITAL_COMMUNITY): Payer: Medicaid Other

## 2014-10-20 DIAGNOSIS — R40244 Other coma, without documented Glasgow coma scale score, or with partial score reported: Secondary | ICD-10-CM

## 2014-10-20 DIAGNOSIS — I10 Essential (primary) hypertension: Secondary | ICD-10-CM | POA: Insufficient documentation

## 2014-10-20 DIAGNOSIS — Z79899 Other long term (current) drug therapy: Secondary | ICD-10-CM

## 2014-10-20 DIAGNOSIS — Z96612 Presence of left artificial shoulder joint: Secondary | ICD-10-CM | POA: Diagnosis present

## 2014-10-20 DIAGNOSIS — T50904A Poisoning by unspecified drugs, medicaments and biological substances, undetermined, initial encounter: Secondary | ICD-10-CM

## 2014-10-20 DIAGNOSIS — T50901A Poisoning by unspecified drugs, medicaments and biological substances, accidental (unintentional), initial encounter: Secondary | ICD-10-CM | POA: Diagnosis present

## 2014-10-20 DIAGNOSIS — K219 Gastro-esophageal reflux disease without esophagitis: Secondary | ICD-10-CM | POA: Diagnosis present

## 2014-10-20 DIAGNOSIS — Z0189 Encounter for other specified special examinations: Secondary | ICD-10-CM

## 2014-10-20 DIAGNOSIS — G92 Toxic encephalopathy: Secondary | ICD-10-CM | POA: Diagnosis present

## 2014-10-20 DIAGNOSIS — Z22322 Carrier or suspected carrier of Methicillin resistant Staphylococcus aureus: Secondary | ICD-10-CM

## 2014-10-20 DIAGNOSIS — Y92009 Unspecified place in unspecified non-institutional (private) residence as the place of occurrence of the external cause: Secondary | ICD-10-CM

## 2014-10-20 DIAGNOSIS — G40909 Epilepsy, unspecified, not intractable, without status epilepticus: Secondary | ICD-10-CM

## 2014-10-20 DIAGNOSIS — R001 Bradycardia, unspecified: Secondary | ICD-10-CM | POA: Diagnosis present

## 2014-10-20 DIAGNOSIS — Z01818 Encounter for other preprocedural examination: Secondary | ICD-10-CM

## 2014-10-20 DIAGNOSIS — T424X1A Poisoning by benzodiazepines, accidental (unintentional), initial encounter: Secondary | ICD-10-CM

## 2014-10-20 DIAGNOSIS — J9601 Acute respiratory failure with hypoxia: Secondary | ICD-10-CM | POA: Diagnosis present

## 2014-10-20 DIAGNOSIS — R34 Anuria and oliguria: Secondary | ICD-10-CM | POA: Diagnosis present

## 2014-10-20 DIAGNOSIS — I959 Hypotension, unspecified: Secondary | ICD-10-CM | POA: Diagnosis not present

## 2014-10-20 DIAGNOSIS — I952 Hypotension due to drugs: Secondary | ICD-10-CM

## 2014-10-20 DIAGNOSIS — R4182 Altered mental status, unspecified: Secondary | ICD-10-CM | POA: Insufficient documentation

## 2014-10-20 DIAGNOSIS — F259 Schizoaffective disorder, unspecified: Secondary | ICD-10-CM | POA: Diagnosis present

## 2014-10-20 DIAGNOSIS — F418 Other specified anxiety disorders: Secondary | ICD-10-CM | POA: Diagnosis present

## 2014-10-20 DIAGNOSIS — F23 Brief psychotic disorder: Secondary | ICD-10-CM | POA: Diagnosis not present

## 2014-10-20 DIAGNOSIS — R404 Transient alteration of awareness: Secondary | ICD-10-CM | POA: Diagnosis present

## 2014-10-20 DIAGNOSIS — E785 Hyperlipidemia, unspecified: Secondary | ICD-10-CM | POA: Diagnosis present

## 2014-10-20 DIAGNOSIS — T447X1A Poisoning by beta-adrenoreceptor antagonists, accidental (unintentional), initial encounter: Secondary | ICD-10-CM

## 2014-10-20 DIAGNOSIS — J9600 Acute respiratory failure, unspecified whether with hypoxia or hypercapnia: Secondary | ICD-10-CM

## 2014-10-20 DIAGNOSIS — Z978 Presence of other specified devices: Secondary | ICD-10-CM

## 2014-10-20 LAB — RAPID URINE DRUG SCREEN, HOSP PERFORMED
Amphetamines: NOT DETECTED
Barbiturates: NOT DETECTED
Benzodiazepines: POSITIVE — AB
Cocaine: NOT DETECTED
Opiates: NOT DETECTED
Tetrahydrocannabinol: NOT DETECTED

## 2014-10-20 LAB — CBC WITH DIFFERENTIAL/PLATELET
Basophils Absolute: 0 10*3/uL (ref 0.0–0.1)
Basophils Relative: 0 % (ref 0–1)
Eosinophils Absolute: 0.1 10*3/uL (ref 0.0–0.7)
Eosinophils Relative: 2 % (ref 0–5)
HCT: 41.5 % (ref 39.0–52.0)
Hemoglobin: 14.2 g/dL (ref 13.0–17.0)
Lymphocytes Relative: 21 % (ref 12–46)
Lymphs Abs: 1.4 10*3/uL (ref 0.7–4.0)
MCH: 32.7 pg (ref 26.0–34.0)
MCHC: 34.2 g/dL (ref 30.0–36.0)
MCV: 95.6 fL (ref 78.0–100.0)
Monocytes Absolute: 0.6 10*3/uL (ref 0.1–1.0)
Monocytes Relative: 9 % (ref 3–12)
Neutro Abs: 4.5 10*3/uL (ref 1.7–7.7)
Neutrophils Relative %: 68 % (ref 43–77)
Platelets: 181 10*3/uL (ref 150–400)
RBC: 4.34 MIL/uL (ref 4.22–5.81)
RDW: 16 % — ABNORMAL HIGH (ref 11.5–15.5)
WBC: 6.7 10*3/uL (ref 4.0–10.5)

## 2014-10-20 LAB — I-STAT ARTERIAL BLOOD GAS, ED
Acid-base deficit: 1 mmol/L (ref 0.0–2.0)
Acid-base deficit: 3 mmol/L — ABNORMAL HIGH (ref 0.0–2.0)
Bicarbonate: 24.7 mEq/L — ABNORMAL HIGH (ref 20.0–24.0)
Bicarbonate: 21.7 mEq/L (ref 20.0–24.0)
O2 Saturation: 89 %
O2 Saturation: 97 %
Patient temperature: 98.6
Patient temperature: 98.6
TCO2: 26 mmol/L (ref 0–100)
TCO2: 23 mmol/L (ref 0–100)
pCO2 arterial: 43.5 mmHg (ref 35.0–45.0)
pCO2 arterial: 36.3 mmHg (ref 35.0–45.0)
pH, Arterial: 7.362 (ref 7.350–7.450)
pH, Arterial: 7.385 (ref 7.350–7.450)
pO2, Arterial: 60 mmHg — ABNORMAL LOW (ref 80.0–100.0)
pO2, Arterial: 94 mmHg (ref 80.0–100.0)

## 2014-10-20 LAB — CBC
HCT: 41.3 % (ref 39.0–52.0)
Hemoglobin: 14 g/dL (ref 13.0–17.0)
MCH: 32.6 pg (ref 26.0–34.0)
MCHC: 33.9 g/dL (ref 30.0–36.0)
MCV: 96.3 fL (ref 78.0–100.0)
Platelets: UNDETERMINED 10*3/uL (ref 150–400)
RBC: 4.29 MIL/uL (ref 4.22–5.81)
RDW: 16.3 % — ABNORMAL HIGH (ref 11.5–15.5)
WBC: 6.4 10*3/uL (ref 4.0–10.5)

## 2014-10-20 LAB — TSH: TSH: 3.22 u[IU]/mL (ref 0.350–4.500)

## 2014-10-20 LAB — CBG MONITORING, ED: Glucose-Capillary: 120 mg/dL — ABNORMAL HIGH (ref 70–99)

## 2014-10-20 LAB — SALICYLATE LEVEL: Salicylate Lvl: <2 mg/dL — ABNORMAL LOW (ref 2.8–20.0)

## 2014-10-20 LAB — CREATININE, SERUM
Creatinine, Ser: 0.86 mg/dL (ref 0.50–1.35)
GFR calc Af Amer: >90 mL/min (ref >90)
GFR calc non Af Amer: >90 mL/min (ref >90)

## 2014-10-20 LAB — I-STAT CHEM 8, ED
BUN: 16 mg/dL (ref 6–23)
Calcium, Ion: 1.08 mmol/L — ABNORMAL LOW (ref 1.13–1.30)
Chloride: 106 mEq/L (ref 96–112)
Creatinine, Ser: 1 mg/dL (ref 0.50–1.35)
Glucose, Bld: 108 mg/dL — ABNORMAL HIGH (ref 70–99)
HCT: 43 % (ref 39.0–52.0)
Hemoglobin: 14.6 g/dL (ref 13.0–17.0)
Potassium: 5.1 mEq/L (ref 3.7–5.3)
Sodium: 139 mEq/L (ref 137–147)
TCO2: 24 mmol/L (ref 0–100)

## 2014-10-20 LAB — PHENYTOIN LEVEL, TOTAL: Phenytoin Lvl: 15.3 ug/mL (ref 10.0–20.0)

## 2014-10-20 LAB — ACETAMINOPHEN LEVEL: Acetaminophen (Tylenol), Serum: <15 ug/mL (ref 10–30)

## 2014-10-20 LAB — ETHANOL: Alcohol, Ethyl (B): <11 mg/dL (ref 0–11)

## 2014-10-20 LAB — LACTIC ACID, PLASMA: Lactic Acid, Venous: 1.9 mmol/L (ref 0.5–2.2)

## 2014-10-20 LAB — MRSA PCR SCREENING: MRSA by PCR: NEGATIVE

## 2014-10-20 LAB — CK: Total CK: 190 U/L (ref 7–232)

## 2014-10-20 LAB — TRIGLYCERIDES: Triglycerides: 195 mg/dL — ABNORMAL HIGH (ref <150)

## 2014-10-20 MED ORDER — PHENYTOIN 50 MG PO CHEW
200.0000 mg | CHEWABLE_TABLET | Freq: Every day | ORAL | Status: DC
  Administered 2014-10-20: 200 mg
  Filled 2014-10-20 (×2): qty 4

## 2014-10-20 MED ORDER — SODIUM CHLORIDE 0.9 % IV BOLUS (SEPSIS)
1000.0000 mL | Freq: Once | INTRAVENOUS | Status: AC
  Administered 2014-10-20: 1000 mL via INTRAVENOUS

## 2014-10-20 MED ORDER — SODIUM CHLORIDE 0.9 % IV SOLN: 250.0000 mL | INTRAVENOUS | Status: DC | PRN

## 2014-10-20 MED ORDER — ETOMIDATE 2 MG/ML IV SOLN
20.0000 mg | Freq: Once | INTRAVENOUS | Status: AC
  Administered 2014-10-20: 20 mg via INTRAVENOUS

## 2014-10-20 MED ORDER — MIDAZOLAM HCL 2 MG/2ML IJ SOLN
INTRAMUSCULAR | Status: AC
  Filled 2014-10-20: qty 6

## 2014-10-20 MED ORDER — SODIUM CHLORIDE 0.9 % IV BOLUS (SEPSIS)
500.0000 mL | Freq: Once | INTRAVENOUS | Status: AC
  Administered 2014-10-21: 500 mL via INTRAVENOUS

## 2014-10-20 MED ORDER — SODIUM CHLORIDE 0.9 % IV SOLN
0.0000 ug/h | INTRAVENOUS | Status: DC
  Administered 2014-10-20: 100 ug/h via INTRAVENOUS
  Filled 2014-10-20 (×2): qty 50

## 2014-10-20 MED ORDER — SODIUM CHLORIDE 0.9 % IV SOLN
INTRAVENOUS | Status: DC
  Administered 2014-10-20: 20:00:00 via INTRAVENOUS

## 2014-10-20 MED ORDER — SODIUM CHLORIDE 0.9 % IV SOLN
200.0000 mg | Freq: Every day | INTRAVENOUS | Status: DC
  Filled 2014-10-20: qty 4

## 2014-10-20 MED ORDER — MIDAZOLAM HCL 5 MG/5ML IJ SOLN
5.0000 mg | Freq: Once | INTRAMUSCULAR | Status: AC
  Administered 2014-10-20: 5 mg via INTRAVENOUS

## 2014-10-20 MED ORDER — FENTANYL CITRATE 0.05 MG/ML IJ SOLN: 100.0000 ug | INTRAMUSCULAR | Status: DC | PRN

## 2014-10-20 MED ORDER — SODIUM CHLORIDE 0.9 % IV SOLN
1.0000 mg/h | INTRAVENOUS | Status: DC
  Administered 2014-10-20: 5 mg/h via INTRAVENOUS
  Filled 2014-10-20: qty 10

## 2014-10-20 MED ORDER — SODIUM CHLORIDE 0.9 % IV BOLUS (SEPSIS): 2000.0000 mL | Freq: Once | INTRAVENOUS | Status: DC

## 2014-10-20 MED ORDER — ROCURONIUM BROMIDE 50 MG/5ML IV SOLN
70.0000 mg | Freq: Once | INTRAVENOUS | Status: AC
  Administered 2014-10-20: 70 mg via INTRAVENOUS

## 2014-10-20 MED ORDER — PANTOPRAZOLE SODIUM 40 MG IV SOLR
40.0000 mg | Freq: Every day | INTRAVENOUS | Status: DC
  Administered 2014-10-20 – 2014-10-21 (×2): 40 mg via INTRAVENOUS
  Filled 2014-10-20 (×4): qty 40

## 2014-10-20 MED ORDER — ATROPINE SULFATE 1 MG/ML IJ SOLN
0.5000 mg | Freq: Once | INTRAMUSCULAR | Status: AC
  Administered 2014-10-20: 0.5 mg via INTRAVENOUS

## 2014-10-20 MED ORDER — NALOXONE HCL 1 MG/ML IJ SOLN
INTRAMUSCULAR | Status: AC
Start: 2014-10-20 — End: 2014-10-21
  Filled 2014-10-20: qty 2

## 2014-10-20 MED ORDER — PROPOFOL 10 MG/ML IV EMUL
5.0000 ug/kg/min | Freq: Once | INTRAVENOUS | Status: DC
Start: 2014-10-20 — End: 2014-10-20

## 2014-10-20 MED ORDER — PROPOFOL 10 MG/ML IV EMUL
0.0000 ug/kg/min | INTRAVENOUS | Status: DC
  Administered 2014-10-20 – 2014-10-21 (×2): 30 ug/kg/min via INTRAVENOUS
  Filled 2014-10-20: qty 100

## 2014-10-20 MED ORDER — ATROPINE SULFATE 0.1 MG/ML IJ SOLN
0.5000 mg | Freq: Once | INTRAMUSCULAR | Status: AC
  Administered 2014-10-20: 0.5 mg via INTRAVENOUS

## 2014-10-20 MED ORDER — SUCCINYLCHOLINE CHLORIDE 20 MG/ML IJ SOLN
100.0000 mg | Freq: Once | INTRAMUSCULAR | Status: AC
  Administered 2014-10-20: 100 mg via INTRAVENOUS
  Filled 2014-10-20: qty 5

## 2014-10-20 MED ORDER — HEPARIN SODIUM (PORCINE) 5000 UNIT/ML IJ SOLN
5000.0000 [IU] | Freq: Three times a day (TID) | INTRAMUSCULAR | Status: DC
  Administered 2014-10-20 – 2014-10-25 (×13): 5000 [IU] via SUBCUTANEOUS
  Filled 2014-10-20 (×18): qty 1

## 2014-10-20 MED ORDER — DOPAMINE-DEXTROSE 3.2-5 MG/ML-% IV SOLN
0.0000 ug/kg/min | Freq: Once | INTRAVENOUS | Status: DC
Start: 2014-10-20 — End: 2014-10-20

## 2014-10-20 MED ORDER — SODIUM CHLORIDE 0.9 % IV SOLN
1.0000 g | Freq: Once | INTRAVENOUS | Status: AC
  Administered 2014-10-20: 1 g via INTRAVENOUS
  Filled 2014-10-20: qty 10

## 2014-10-20 NOTE — ED Notes (Signed)
Pt. Prepared for intubation.
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
## 2014-10-20 NOTE — Progress Notes (Signed)
Propofol turned down to 62mcg and versed 4mg , still awaiting Fentanyl drip

## 2014-10-20 NOTE — ED Notes (Signed)
Family at bedside. 

## 2014-10-20 NOTE — Progress Notes (Signed)
[facility] Progress Note Patient Name: [name] DOB: 1956-03-17 MRN: [id number]   Date of Service  10/20/2014  HPI/Events of Note  Sinus brady Suspected overdose ? Clonazepam/seroquel + beta blocker -based on med list Transported from ED on versed + propofol gtt  eICU Interventions  Use fent gtt Taper versed to off OK to use low dose propofol once fent max-ed Change dilantin to via tube -levels theapeutic   New ICU patient evaluation: This patient was evaluated by the [facility] team. I have reviewed relevant documentation including care plan & orders.   Intervention Category Evaluation Type: New Patient Evaluation  Jenni Thew V. 10/20/2014, 8:09 PM

## 2014-10-20 NOTE — ED Notes (Signed)
CBG 120 

## 2014-10-20 NOTE — Progress Notes (Signed)
Patient came from ED on the following sedation drips  Propofol 59mcg Versed 6mg ,  Reginal Lutes MD about orders for sedation, per MD will wean Propofol drip and Versed drip, and will start fentanyl drip once it arrives.

## 2014-10-20 NOTE — ED Notes (Signed)
Best Friend at bedside. Marland Kitchen

## 2014-10-20 NOTE — ED Notes (Signed)
Talked with friend (Arvin). Will be in to talk with MD.

## 2014-10-20 NOTE — ED Notes (Signed)
Dr. Pfeiffer at bedside   

## 2014-10-20 NOTE — ED Notes (Signed)
Pt back from CT, accompanied by this RN

## 2014-10-20 NOTE — ED Provider Notes (Signed)
CSN: [id number]     Arrival date & time 10/20/14  1510 History   First MD Initiated Contact with Patient 10/20/14 1523     Chief Complaint  Patient presents with  . Altered Mental Status     (Consider location/radiation/quality/duration/timing/severity/associated sxs/prior Treatment) HPI Comments: Home nurse found pt unresponsive surrounded by pill bottles. Bottles not brought with pt. Pt unresponsive on arrival. Will withdraw to painful stimuli.   Patient is a 58 y.o. male presenting with altered mental status. The history is provided by the EMS personnel. No language interpreter was used.  Altered Mental Status Presenting symptoms: unresponsiveness   Severity:  Severe Most recent episode:  Today Duration: unknown, found by home health one hour pta. Timing:  Constant Progression:  Unchanged Chronicity:  New Context: not taking medications as prescribed     Past Medical History  Diagnosis Date  . Seizures   . Hypertension   . GERD (gastroesophageal reflux disease)   . Dyslipidemia   . Schizoaffective disorder   . Depression   . Anxiety   . MRSA carrier    Past Surgical History  Procedure Laterality Date  . Self orchectomy    . Colonoscopy  2010  . Orif shoulder fracture    . Shoulder closed reduction Left 09/16/2013    Procedure: CLOSED REDUCTION SHOULDER;  Surgeon: Mauri Pole, MD;  Location: [facility];  Service: Orthopedics;  Laterality: Left;  . Shoulder hemi-arthroplasty Left 09/18/2013    Procedure: LEFT SHOULDER HEMI-ARTHROPLASTY;  Surgeon: Augustin Schooling, MD;  Location: [facility];  Service: Orthopedics;  Laterality: Left;   Family History  Problem Relation Age of Onset  . Stroke Father     Living at 22  . Stroke Mother     Stroke in late 43's. Died in her 67's   History  Substance Use Topics  . Smoking status: Never Smoker   . Smokeless tobacco: Never Used  . Alcohol Use: No    Review of Systems  Unable to perform ROS: Mental status change       Allergies  Codeine and Sulfa antibiotics  Home Medications   Prior to Admission medications   Medication Sig Start Date End Date Taking? Authorizing Provider  amitriptyline (ELAVIL) 150 MG tablet Take 350 mg by mouth at bedtime. 08/05/14   Leeanne Rio, MD  clonazePAM (KLONOPIN) 2 MG tablet Take 2 mg by mouth 4 (four) times daily. 05/19/14   Melony Overly, MD  metoprolol succinate (TOPROL-XL) 100 MG 24 hr tablet Take 1 tablet (100 mg total) by mouth at bedtime. Take with or immediately following a meal. 08/05/14   Leeanne Rio, MD  omeprazole (PRILOSEC) 20 MG capsule Take 1 capsule (20 mg total) by mouth daily as needed. For heartburn or acid reflux. 05/19/14   Melony Overly, MD  phenytoin (DILANTIN) 50 MG tablet Chew 4 tablets (200 mg total) by mouth at bedtime. 05/19/14   Melony Overly, MD  polyethylene glycol (MIRALAX / GLYCOLAX) packet Take 17 g by mouth daily as needed. For constipation. Mix into 8 ounces of fluid & drink 07/23/13   Kandis Nab, MD  QUEtiapine (SEROQUEL) 300 MG tablet Take 3 tablets (900 mg total) by mouth at bedtime. Takes 3 tabs QHS 05/19/14   Melony Overly, MD  ramelteon (ROZEREM) 8 MG tablet Take 1 tablet (8 mg total) by mouth at bedtime. 05/19/14   Melony Overly, MD  simvastatin (ZOCOR) 40 MG tablet Take 1 tablet (40 mg  total) by mouth at bedtime. 05/19/14   Melony Overly, MD  testosterone cypionate (DEPOTESTOTERONE CYPIONATE) 200 MG/ML injection Inject 2 mLs (400 mg total) into the muscle every 14 (fourteen) days. 05/17/14   Nena Polio, PA-C   BP 138/81 mmHg  Pulse 57  Temp(Src) 98.1 F (36.7 C) (Oral)  Resp 16  SpO2 100% Physical Exam  Constitutional: He appears well-developed.  HENT:  Head: Normocephalic and atraumatic.  Mouth/Throat: Oropharynx is clear and moist.  Eyes: Pupils are equal, round, and reactive to light.  Neck: No tracheal deviation present.  Cardiovascular: Regular rhythm and intact distal pulses.  Bradycardia present.    Pulmonary/Chest: Breath sounds normal. No respiratory distress.  Abdominal: Soft.  Musculoskeletal: He exhibits no edema.  Neurological: GCS eye subscore is 1. GCS verbal subscore is 1. GCS motor subscore is 3.  Skin: Skin is warm.  Vitals reviewed.   ED Course  INTUBATION Date/Time: 10/20/2014 3:37 PM Performed by: Amparo Bristol Authorized by: Amparo Bristol Consent: The procedure was performed in an emergent situation. Patient identity confirmed: arm band Time out: Immediately prior to procedure a "time out" was called to verify the correct patient, procedure, equipment, support staff and site/side marked as required. Indications: airway protection Intubation method: video-assisted Patient status: paralyzed (RSI) Preoxygenation: nonrebreather mask Sedatives: etomidate Paralytic: succinylcholine Tube size: 7.5 mm Number of attempts: 2 (first attempt direct, 2nd attempt video assisted) Ventilation between attempts: BVM Cricoid pressure: no Cords visualized: yes Post-procedure assessment: chest rise and CO2 detector Breath sounds: equal and absent over the epigastrium Cuff inflated: yes ETT to teeth: 25 cm Tube secured with: ETT holder Chest x-ray interpreted by me. Chest x-ray findings: endotracheal tube in appropriate position Patient tolerance: Patient tolerated the procedure well with no immediate complications  CRITICAL CARE Performed by: Amparo Bristol Authorized by: Amparo Bristol Total critical care time: 45 minutes Critical care time was exclusive of separately billable procedures and treating other patients. Critical care was necessary to treat or prevent imminent or life-threatening deterioration of the following conditions: circulatory failure and toxidrome. Critical care was time spent personally by me on the following activities: discussions with consultants, evaluation of patient's response to treatment, examination of patient, ordering and performing treatments  and interventions, ordering and review of laboratory studies, ordering and review of radiographic studies and re-evaluation of patient's condition.   (including critical care time) Labs Review Labs Reviewed  SALICYLATE LEVEL - Abnormal; Notable for the following:    Salicylate Lvl <7.6 (*)    All other components within normal limits  URINE RAPID DRUG SCREEN (HOSP PERFORMED) - Abnormal; Notable for the following:    Benzodiazepines POSITIVE (*)    All other components within normal limits  CBC WITH DIFFERENTIAL - Abnormal; Notable for the following:    RDW 16.0 (*)    All other components within normal limits  I-STAT CHEM 8, ED - Abnormal; Notable for the following:    Glucose, Bld 108 (*)    Calcium, Ion 1.08 (*)    All other components within normal limits  I-STAT ARTERIAL BLOOD GAS, ED - Abnormal; Notable for the following:    pO2, Arterial 60.0 (*)    Bicarbonate 24.7 (*)    All other components within normal limits  CBG MONITORING, ED - Abnormal; Notable for the following:    Glucose-Capillary 120 (*)    All other components within normal limits  I-STAT ARTERIAL BLOOD GAS, ED - Abnormal; Notable for the following:    Acid-base deficit 3.0 (*)  All other components within normal limits  ACETAMINOPHEN LEVEL  ETHANOL  PHENYTOIN LEVEL, TOTAL    Imaging Review Ct Head Wo Contrast  10/20/2014   CLINICAL DATA:  Patient found unresponsive earlier in the day  EXAM: CT HEAD WITHOUT CONTRAST  TECHNIQUE: Contiguous axial images were obtained from the base of the skull through the vertex without intravenous contrast.  COMPARISON:  October 31, 2013  FINDINGS: There is age related volume loss. There is no mass, hemorrhage, extra-axial fluid collection, or midline shift. Gray-white compartments appear normal. No acute infarct evident. Bony calvarium appears intact. The mastoid air cells are clear.  IMPRESSION: Age related volume loss. No intracranial mass, hemorrhage, or focal gray -white  compartment lesion/acute appearing infarct.   Electronically Signed   By: Lowella Grip M.D.   On: 10/20/2014 15:55   Dg Chest Portable 1 View  10/20/2014   CLINICAL DATA:  Initial encounter for endotracheal tube placement.  EXAM: PORTABLE CHEST - 1 VIEW  COMPARISON:  10/31/2013.  FINDINGS: 1715 hrs. Endotracheal tube tip is 2.9 cm above the base of the chronic. Lung volumes are markedly low with vascular congestion and chronic atelectasis or scarring in right mid lung. No overt airspace pulmonary edema or focal lung consolidation. No pleural effusion. Telemetry leads overlie the chest. Surgical changes are noted in both shoulders.  IMPRESSION: Endotracheal tube tip is approximately 3 cm above the base of the chronic.  Low lung volumes vascular congestion possible component of interstitial edema.  Stable chronic atelectasis or scarring in right mid.   Electronically Signed   By: Misty Stanley M.D.   On: 10/20/2014 17:26     EKG Interpretation None      MDM   Final diagnoses:  Encounter for intubation  Acute respiratory failure, unspecified whether with hypoxia or hypercapnia  Bradycardia, sinus  Overdose, undetermined intent, initial encounter   58 y/o male with medication overdose. Bradycardia and hypotension consistent with beta blocker overdose. 1 mg atropine given and IVF bolus with adequate response. Intubated for airway protection. Labs + for benzodiazepines which would explain pts AMS. Admitted to ICU.      Amparo Bristol, MD 10/20/14 5638  Charlesetta Shanks, MD 10/25/14 1053

## 2014-10-20 NOTE — Progress Notes (Signed)
BP have been soft, UO last 2 hours was only 10 cc, paged MD, MD will call back

## 2014-10-20 NOTE — ED Notes (Signed)
MD at bedside.  Intensivist 

## 2014-10-20 NOTE — ED Notes (Signed)
Pt. From home; home health nurse came to check and found pt. Unresponsive - open pills laying around; pupils 4 and neuro reactive - on scene not responding to pain, not following commands; pt. Was/is maintaining own airway. 100% nrb. Rhythm: SB/first degree?, no incontinence; hx. Seizure. Vs: 98/66, 58, sao2 100% NRB.

## 2014-10-20 NOTE — ED Notes (Signed)
MD at bedside. 

## 2014-10-20 NOTE — Progress Notes (Signed)
eLink Physician-Brief Progress Note Patient Name: [name] DOB: Jul 27, 1956 MRN: [id number]   Date of Service  10/20/2014  HPI/Events of Note  Drop in output Foley flushed  eICU Interventions  UA, bolus     Intervention Category Intermediate Interventions: Oliguria - evaluation and management  Raylene Miyamoto. 10/20/2014, 11:48 PM

## 2014-10-20 NOTE — ED Notes (Signed)
Caregiver at bedside

## 2014-10-21 ENCOUNTER — Telehealth: Payer: Self-pay | Admitting: Neurology

## 2014-10-21 ENCOUNTER — Institutional Professional Consult (permissible substitution): Payer: Self-pay | Admitting: Neurology

## 2014-10-21 ENCOUNTER — Inpatient Hospital Stay (HOSPITAL_COMMUNITY): Payer: Medicaid Other

## 2014-10-21 DIAGNOSIS — G9340 Encephalopathy, unspecified: Secondary | ICD-10-CM

## 2014-10-21 DIAGNOSIS — T50904A Poisoning by unspecified drugs, medicaments and biological substances, undetermined, initial encounter: Secondary | ICD-10-CM

## 2014-10-21 DIAGNOSIS — R569 Unspecified convulsions: Secondary | ICD-10-CM

## 2014-10-21 LAB — CBC
HCT: 44 % (ref 39.0–52.0)
Hemoglobin: 15.5 g/dL (ref 13.0–17.0)
MCH: 33.5 pg (ref 26.0–34.0)
MCHC: 35.2 g/dL (ref 30.0–36.0)
MCV: 95 fL (ref 78.0–100.0)
Platelets: 152 10*3/uL (ref 150–400)
RBC: 4.63 MIL/uL (ref 4.22–5.81)
RDW: 15.9 % — ABNORMAL HIGH (ref 11.5–15.5)
WBC: 11.2 10*3/uL — ABNORMAL HIGH (ref 4.0–10.5)

## 2014-10-21 LAB — BASIC METABOLIC PANEL
Anion gap: 14 (ref 5–15)
BUN: 14 mg/dL (ref 6–23)
CO2: 20 mEq/L (ref 19–32)
Calcium: 8.7 mg/dL (ref 8.4–10.5)
Chloride: 107 mEq/L (ref 96–112)
Creatinine, Ser: 0.86 mg/dL (ref 0.50–1.35)
GFR calc Af Amer: >90 mL/min (ref >90)
GFR calc non Af Amer: >90 mL/min (ref >90)
Glucose, Bld: 82 mg/dL (ref 70–99)
Potassium: 4.3 mEq/L (ref 3.7–5.3)
Sodium: 141 mEq/L (ref 137–147)

## 2014-10-21 LAB — URINALYSIS, ROUTINE W REFLEX MICROSCOPIC
Bilirubin Urine: NEGATIVE
Glucose, UA: NEGATIVE mg/dL
Ketones, ur: NEGATIVE mg/dL
Nitrite: NEGATIVE
Protein, ur: NEGATIVE mg/dL
Specific Gravity, Urine: 1.023 (ref 1.005–1.030)
Urobilinogen, UA: 0.2 mg/dL (ref 0.0–1.0)
pH: 6 (ref 5.0–8.0)

## 2014-10-21 LAB — BLOOD GAS, ARTERIAL
Acid-base deficit: 2.5 mmol/L — ABNORMAL HIGH (ref 0.0–2.0)
Bicarbonate: 20.1 mEq/L (ref 20.0–24.0)
Drawn by: 347621
FIO2: 0.4 %
MECHVT: 600 mL
O2 Saturation: 98.1 %
PEEP: 5 cmH2O
Patient temperature: 98.3
RATE: 16 resp/min
TCO2: 20.9 mmol/L (ref 0–100)
pCO2 arterial: 25.1 mmHg — ABNORMAL LOW (ref 35.0–45.0)
pH, Arterial: 7.514 — ABNORMAL HIGH (ref 7.350–7.450)
pO2, Arterial: 98.5 mmHg (ref 80.0–100.0)

## 2014-10-21 LAB — PHOSPHORUS: Phosphorus: 2.5 mg/dL (ref 2.3–4.6)

## 2014-10-21 LAB — URINE MICROSCOPIC-ADD ON

## 2014-10-21 LAB — MAGNESIUM: Magnesium: 2.1 mg/dL (ref 1.5–2.5)

## 2014-10-21 LAB — PHENYTOIN LEVEL, TOTAL: Phenytoin Lvl: 14.3 ug/mL (ref 10.0–20.0)

## 2014-10-21 MED ORDER — PROPOFOL BOLUS VIA INFUSION
70.0000 mg | Freq: Once | INTRAVENOUS | Status: AC
  Administered 2014-10-21: 70 mg via INTRAVENOUS
  Filled 2014-10-21: qty 70

## 2014-10-21 MED ORDER — LORAZEPAM 2 MG/ML IJ SOLN
2.0000 mg | INTRAMUSCULAR | Status: DC | PRN
  Administered 2014-10-21 – 2014-10-23 (×2): 2 mg via INTRAVENOUS
  Filled 2014-10-21 (×2): qty 1

## 2014-10-21 MED ORDER — CETYLPYRIDINIUM CHLORIDE 0.05 % MT LIQD
7.0000 mL | Freq: Four times a day (QID) | OROMUCOSAL | Status: DC
  Administered 2014-10-21: 7 mL via OROMUCOSAL

## 2014-10-21 MED ORDER — CHLORHEXIDINE GLUCONATE 0.12 % MT SOLN: 15.0000 mL | Freq: Two times a day (BID) | OROMUCOSAL | Status: DC

## 2014-10-21 MED ORDER — SODIUM CHLORIDE 0.9 % IV BOLUS (SEPSIS): 500.0000 mL | Freq: Once | INTRAVENOUS | Status: DC

## 2014-10-21 MED ORDER — HALOPERIDOL LACTATE 5 MG/ML IJ SOLN
INTRAMUSCULAR | Status: AC
  Administered 2014-10-21: 5 mg via INTRAVENOUS
  Filled 2014-10-21: qty 1

## 2014-10-21 MED ORDER — SODIUM CHLORIDE 0.9 % IV SOLN
200.0000 mg | Freq: Every day | INTRAVENOUS | Status: DC
  Administered 2014-10-21 – 2014-10-23 (×2): 200 mg via INTRAVENOUS
  Filled 2014-10-21 (×6): qty 4

## 2014-10-21 MED ORDER — LORAZEPAM 2 MG/ML IJ SOLN
INTRAMUSCULAR | Status: AC
Start: 2014-10-21 — End: 2014-10-21
  Administered 2014-10-21: 2 mg via INTRAVENOUS
  Filled 2014-10-21: qty 1

## 2014-10-21 MED ORDER — CEFTRIAXONE SODIUM 1 G IJ SOLR
1.0000 g | INTRAMUSCULAR | Status: DC
  Administered 2014-10-21 – 2014-10-22 (×2): 1 g via INTRAVENOUS
  Filled 2014-10-21 (×3): qty 10

## 2014-10-21 MED ORDER — POLYETHYLENE GLYCOL 3350 17 G PO PACK: 17.0000 g | PACK | Freq: Every day | ORAL | Status: DC | PRN

## 2014-10-21 MED ORDER — PRO-STAT SUGAR FREE PO LIQD
30.0000 mL | Freq: Two times a day (BID) | ORAL | Status: DC
  Filled 2014-10-21 (×2): qty 30

## 2014-10-21 MED ORDER — AMITRIPTYLINE HCL 100 MG PO TABS
100.0000 mg | ORAL_TABLET | Freq: Every day | ORAL | Status: DC
  Administered 2014-10-21 – 2014-10-22 (×2): 100 mg via ORAL
  Filled 2014-10-21 (×3): qty 1

## 2014-10-21 MED ORDER — CHLORHEXIDINE GLUCONATE 0.12 % MT SOLN
15.0000 mL | Freq: Two times a day (BID) | OROMUCOSAL | Status: DC
  Administered 2014-10-21 (×2): 15 mL via OROMUCOSAL
  Filled 2014-10-21: qty 15

## 2014-10-21 MED ORDER — QUETIAPINE FUMARATE 300 MG PO TABS
600.0000 mg | ORAL_TABLET | Freq: Every day | ORAL | Status: DC
  Administered 2014-10-21 – 2014-10-22 (×2): 600 mg via ORAL
  Filled 2014-10-21 (×3): qty 2

## 2014-10-21 MED ORDER — LORAZEPAM 2 MG/ML IJ SOLN
2.0000 mg | Freq: Once | INTRAMUSCULAR | Status: AC
  Administered 2014-10-21: 2 mg via INTRAVENOUS

## 2014-10-21 MED ORDER — CHLORHEXIDINE GLUCONATE 0.12 % MT SOLN
OROMUCOSAL | Status: AC
Start: 2014-10-21 — End: 2014-10-21
  Filled 2014-10-21: qty 15

## 2014-10-21 MED ORDER — HALOPERIDOL LACTATE 5 MG/ML IJ SOLN
5.0000 mg | Freq: Once | INTRAMUSCULAR | Status: AC
  Administered 2014-10-21: 5 mg via INTRAVENOUS

## 2014-10-21 MED ORDER — LORAZEPAM 2 MG/ML IJ SOLN
INTRAMUSCULAR | Status: AC
Start: 2014-10-21 — End: 2014-10-21
  Filled 2014-10-21: qty 1

## 2014-10-21 MED ORDER — VITAL HIGH PROTEIN PO LIQD
1000.0000 mL | ORAL | Status: DC
  Filled 2014-10-21 (×2): qty 1000

## 2014-10-21 NOTE — Significant Event (Signed)
Received call from radiology regarding ETT placement. Per radiologist, ETT need to come back 2cm. RN made aware of this to RRT who stated will adjust.

## 2014-10-21 NOTE — Significant Event (Signed)
Per CCM NP Raul-Dr. Halford Chessman wants Psych to come see patient first before leaving AMA. [facility] has tried to get in touch with Psych. ED social worker at bedside to speak with patient and has paperwork for involuntary commitment. Made aware that patient signed AMA paper but staff has been stalling patient so patient can see psych.   RN spoke with Dr. Halford Chessman to clarify information. Per Halford Chessman, stated Dr. Brock Ra will come see patient today.

## 2014-10-21 NOTE — Significant Event (Signed)
Patient progressed to being combative and aggressive towards staff, wanting to get out of bed to go home. Dr. Joya Gaskins has been at the bedside; sitters at bedside; security officers at bedside. 5mg  Haldol given IV per verbal order from Dr. Joya Gaskins. Margaree Mackintosh MD camera in.

## 2014-10-21 NOTE — Plan of Care (Signed)
Problem: Consults Goal: Suicide Risk Patient Education (See Patient Education module for education specifics) Outcome: Progressing  Problem: Diagnosis: Increased Risk For Suicide Attempt Goal: LTG-Patient Will Show Positive Response to Medication LTG (by discharge) : Patient will show positive response to medication and will participate in the development of the discharge plan. Outcome: Progressing Goal: LTG-Patient Family Informed of Increased Suicide Risk LTG (by discharge) Patient's family or significant other informed of patient's increased risk for suicide and they will be able to verbalize ways they can help the patient stay safe after discharge. Outcome: Progressing Goal: LTG-Patient Will Report Improved Mood and Deny Suicidal LTG (by discharge) Patient will report improved mood and deny suicidal ideation. Outcome: Progressing Goal: LTG-Patient Will Report Improvement in Psychotic Symptoms LTG (by discharge) : Patient will report improvement in psychotic symptoms.  Outcome: Progressing Goal: LTG-Patient Will Report Absence of Withdrawal Symptoms LTG (by discharge): Patient will report absence of withdrawal symptoms. Outcome: Progressing Goal: LTG-Patient will be able to identify a plan to address LTG - Patient will be able to identify a plan to address suicidal feelings after discharge Outcome: Progressing Goal: STG-Patient Will Attend All Groups On The Unit Outcome: Progressing Goal: STG-Patient Will Report Suicidal Feelings to Staff Outcome: Progressing  Problem: Consults Goal: Neurology consult Outcome: Completed/Met Date Met:  10/21/14 Goal: Skin Care Protocol Initiated - if Braden Score 18 or less If consults are not indicated, leave blank or document N/A Outcome: Completed/Met Date Met:  10/21/14 Goal: Nutrition Consult-if indicated Outcome: Completed/Met Date Met:  10/21/14 Goal: Care Management Consult if indicated Outcome: Completed/Met Date Met:  10/21/14

## 2014-10-21 NOTE — Significant Event (Addendum)
Ambulated patient around unit. Patient ambulated independently with staff by his side. No issues during ambulation. Patient pleasant at this time. Patient is slight drowsy, from haldol given earlier. Patient returned to room and in bed. RN called patient's friend Arvin per his requests.

## 2014-10-21 NOTE — Progress Notes (Addendum)
Critical Care Medicine Interim Progress Note  Patient extubated today. History unable to be obtained on admission. I went in to ask patient if he remembered what brought him into the hospital. Pt initially stated the last thing he remembered was being anxious while driving a car, and thought he was in [place]. He says when he first woke up in the hospital here he was not in [place]. He acted very paranoid when discussing this information, as he asked multiple times if he would get in trouble, and eventually referred to being admitted to a psych hospital. I told him multiple times that any information he gave [place] would be used to help in his medical care, and the only time we would need to discuss this with law enforcement would be if he was threatening to harm himself or others; I did state that a psychiatrist would most likely be coming to see him. I again asked him if he remembered anything about yesterday before he was admitted, and he says he did not remember waking up that day. I informed him that the ED notes state his home health RN found him down at his house with several pill bottles by his side. He says that he did not take any additional medication yesterday in an attempt to commit suicide, and states affirmatively that he did not try to kill himself. He then says that he knows all of his medications, however states phenytoin is for his "blood pressure"; he was able to state all of the current home medications I reviewed in the chart. Pt then stated that at home he takes all of his pills out of their bottles and puts them in a drawer, and that when he takes them he pulls them out of the drawer and "puts them on the bed" before taking them. He says he does this because he "knows" all of his medications and has been taking them for a long time. He tells me then that he does not think he took his seroquel yesterday.    At this time I told him that I would be leaving and the medical team would  talk to him again in the morning and that the medical team wanted to monitor him overnight as he had a seizure in the morning and was only extubated a few hours ago. Upon my leaving of the room patient started to become agitated, calling out for me to come back. The second time patient noted that his right IV was bleeding slightly. I told him I would ask for the nurse to come and change the dressing, patient raised his voice accusing me of trying to leave in order to get the nurse. Nurse was paged and arrived to change dressing. Pt then started talking to the nurse stating that "he said I could go home", referring to me as the doctor. I told him that I did not say this at all, that it was my medical opinion he again be observed overnight because of his seizures and requiring intubation. At this time patient was sitting on the edge of the bed and was attempting to stand up; he would not follow directions from the staff or myself. Addendum: I informed the patient that if he wanted to leave AMA I would discuss this with my attending, but patient became combative and would not "allow" me to step out and discuss what we had talked about with any other doctor. Security was called and arrived to the room. I asked the  patient orientation questions to which he ignored me and any instructions to remain in the bed. Addendum: NP called to discuss situation, stated he would get additional provider to come to the room. As he was requiring restraint from security and agitated ordered for 5mg  haldol IV. eICU called and PA arrived, who again reiterated the reasons for being observed in the hospital overnight; pt informed that if he were to leave AMA that he could have a seizure and die. At this time patient was more calm and asked for some water, but still requesting to leave because he needed to "feed his cat" and that he "can't sleep here" in the hospital. Security still at the bedside on PA and myself leaving the room.  Tawanna Sat, MD 10/21/2014, 4:40 PM PGY-2, [place]

## 2014-10-21 NOTE — Significant Event (Addendum)
Patient signed paper for AMA. However, patient does not have any transportation. Friends have been contacted; patient has no closed relatives. RN and patient has been in communication with patient's friend Arvin.

## 2014-10-21 NOTE — Significant Event (Signed)
Patient told staff that his friend will not come to pick him up today at 2100pm but is available tomorrow if needed. RN asked if patient will remain inpatient. Patient agreeable to stay. Patient aware MD to come see him today. All this information passed over to oncoming nightshift RN.

## 2014-10-21 NOTE — Significant Event (Addendum)
Approximately 125cc of fentanyl from bag is wasted in sink and flushed-witnessed by Albin Felling, RN.

## 2014-10-21 NOTE — Telephone Encounter (Signed)
This patient did not show for a new patient appointment today. 

## 2014-10-21 NOTE — Progress Notes (Signed)
SBP 60-70s, fentanyl was decreased to 50 mcg and Diprivan was decreased to 39mcg (was restarted 0345 when patient started to seize and was increased to 46mcg to help with seizure activity).    Per MD will give 500cc NS BOLUS for hypotension

## 2014-10-21 NOTE — Procedures (Signed)
ELECTROENCEPHALOGRAM REPORT  Patient: [name]       Room #: 6O29 EEG No. ID: [id number] Age: 58 y.o.        Sex: male Referring Physician: Fatima Blank Report Date:  10/21/2014        Interpreting Physician: Anthony Sar  History: [name] is an 58 y.o. male admitted following medication overdose with altered mental status and bradycardia as well as respiratory difficulty. Patient subsequently experienced a witnessed generalized seizure.  Indications for study:  Assessment of encephalopathy; rule out seizure activity.  Technique: This is an 18 channel routine scalp EEG performed at the bedside with bipolar and monopolar montages arranged in accordance to the international 10/20 system of electrode placement.   Description: This EEG recording was performed during sleep. Patient was noted to be sedated and on mechanical ventilation at the time of this recording. Predominant background activity consisted of diffuse moderate amplitude mixed irregular delta and theta activity. Symmetrical vertex waves, sleep spindles and arousal responses were recorded during stage II periods of sleep. Photic stimulation was not performed. No epileptiform discharges were recorded.  Interpretation: This EEG shows continuous normal pattern of sleep. No evidence of an epileptic disorder was demonstrated.   Rush Farmer M.D. [name] [phone]

## 2014-10-21 NOTE — Progress Notes (Signed)
[name] Physician-Brief Progress Note Patient Name: [name] DOB: 1956/05/12 MRN: [id number]   Date of Service  10/21/2014  HPI/Events of Note  Seizure activity noted serq OD likely culprit Unclear kloniopin  eICU Interventions  eeg Ativan as after I bolus prop not much improved Obtain dil level      Intervention Category Major Interventions: Seizures - evaluation and management  Sharmila Wrobleski J. 10/21/2014, 3:46 AM

## 2014-10-21 NOTE — Progress Notes (Signed)
[facility] Progress Note Patient Name: [name] DOB: Aug 17, 1956 MRN: [id number]   Date of Service  10/21/2014  HPI/Events of Note  Urine pos  eICU Interventions  Ceftr, culture it     Intervention Category Major Interventions: Seizures - evaluation and management Intermediate Interventions: Infection - evaluation and management  Zanylah Hardie J. 10/21/2014, 3:50 AM

## 2014-10-21 NOTE — Procedures (Signed)
Extubation Procedure Note  Patient Details:   Name: [name] DOB: May 20, 1956 MRN: [id number]   Airway Documentation:     Evaluation  O2 sats: stable throughout and currently acceptable Complications: No apparent complications Patient did tolerate procedure well. Bilateral Breath Sounds: Rhonchi Suctioning: Airway Yes  Miquel Dunn 10/21/2014, 2:08 PM

## 2014-10-21 NOTE — Progress Notes (Signed)
Wasted 10 cc of VERSED drip with JOY A. RN

## 2014-10-21 NOTE — Progress Notes (Signed)
INITIAL NUTRITION ASSESSMENT  DOCUMENTATION CODES Per approved criteria  -Not Applicable   INTERVENTION:  Initiate TF via OGT with Vital High Protein at 25 ml/h and Prostat 30 ml BID on day 1; on day 2, d/c Prostat and increase to goal rate of 65 ml/h (1560 ml per day) to provide 1560 kcals, 137 gm protein, 1304 ml free water daily.  Above TF regimen plus current propofol rate will provide a total of 1842 kcals (97% of estimated needs) and 137 gm protein (100% of estimated needs).  NUTRITION DIAGNOSIS: Inadequate oral intake related to inability to eat as evidenced by NPO status.   Goal: Intake to meet >90% of estimated nutrition needs.  Monitor:  TF tolerance/adequacy, weight trend, labs, vent status.  Reason for Assessment: MD Consult for TF initiation and management.  58 y.o. male  Admitting Dx: Respiratory Failure  ASSESSMENT: 58 y.o. M brought to [facility] ED on 11/4 after being found down at his home when home health RN went to check on him. There were apparently some pill bottles around him (Klonopin, Toprol). In ED, was bradycardic and required atropine x 2. Intubated for airway protection.  Patient is currently intubated on ventilator support MV: 9.7 L/min Temp (24hrs), Avg:97.8 F (36.6 C), Min:97.2 F (36.2 C), Max:98.3 F (36.8 C)  Propofol: 10.7 ml/hr providing 282 kcals per day  Height: Ht Readings from Last 1 Encounters:  10/20/14 5\' 11"  (1.803 m)    Weight: Wt Readings from Last 1 Encounters:  10/21/14 197 lb (89.359 kg)    Ideal Body Weight: 78.2 kg  % Ideal Body Weight: 114%  Wt Readings from Last 10 Encounters:  10/21/14 197 lb (89.359 kg)  10/13/14 196 lb (88.905 kg)  08/26/14 205 lb 6.4 oz (93.169 kg)  08/24/14 204 lb 8 oz (92.761 kg)  08/05/14 205 lb 9.6 oz (93.26 kg)  07/07/14 210 lb 1.6 oz (95.301 kg)  05/19/14 203 lb 4.8 oz (92.216 kg)  05/17/14 202 lb (91.627 kg)  05/07/14 190 lb (86.183 kg)  05/02/14 190 lb (86.183 kg)    Usual  Body Weight: 190-205 lb  % Usual Body Weight: 100%  BMI:  Body mass index is 27.49 kg/(m^2).  Estimated Nutritional Needs: Kcal: 1894 Protein: 125-135 gm Fluid: 2 L  Skin: intact  Diet Order: Diet NPO time specified  EDUCATION NEEDS: -Education not appropriate at this time   Intake/Output Summary (Last 24 hours) at 10/21/14 1038 Last data filed at 10/21/14 0800  Gross per 24 hour  Intake 3639.59 ml  Output   1540 ml  Net 2099.59 ml    Last BM: PTA   Labs:   Recent Labs Lab 10/20/14 1535 10/20/14 2104 10/21/14 0243  NA 139  --  141  K 5.1  --  4.3  CL 106  --  107  CO2  --   --  20  BUN 16  --  14  CREATININE 1.00 0.86 0.86  CALCIUM  --   --  8.7  MG  --   --  2.1  PHOS  --   --  2.5  GLUCOSE 108*  --  82    CBG (last 3)   Recent Labs  10/20/14 1529  GLUCAP 120*    Scheduled Meds: . antiseptic oral rinse  7 mL Mouth Rinse QID  . antiseptic oral rinse  7 mL Mouth Rinse QID  . cefTRIAXone (ROCEPHIN)  IV  1 g Intravenous Q24H  . chlorhexidine  15 mL Mouth Rinse  BID  . chlorhexidine  15 mL Mouth Rinse BID  . heparin  5,000 Units Subcutaneous 3 times per day  . pantoprazole (PROTONIX) IV  40 mg Intravenous QHS  . phenytoin (DILANTIN) IV  200 mg Intravenous QHS  . sodium chloride  500 mL Intravenous Once    Continuous Infusions: . sodium chloride 100 mL/hr at 10/20/14 2000  . fentaNYL infusion INTRAVENOUS 50 mcg/hr (10/21/14 0600)  . propofol 20 mcg/kg/min (10/21/14 0700)    Past Medical History  Diagnosis Date  . Seizures   . Hypertension   . GERD (gastroesophageal reflux disease)   . Dyslipidemia   . Schizoaffective disorder   . Depression   . Anxiety   . MRSA carrier     Past Surgical History  Procedure Laterality Date  . Self orchectomy    . Colonoscopy  2010  . Orif shoulder fracture    . Shoulder closed reduction Left 09/16/2013    Procedure: CLOSED REDUCTION SHOULDER;  Surgeon: Mauri Pole, MD;  Location: [facility];  Service:  Orthopedics;  Laterality: Left;  . Shoulder hemi-arthroplasty Left 09/18/2013    Procedure: LEFT SHOULDER HEMI-ARTHROPLASTY;  Surgeon: Augustin Schooling, MD;  Location: [place];  Service: Orthopedics;  Laterality: Left;    Molli Barrows, RD, LDN, [place] Pager [phone] After Hours Pager [phone]

## 2014-10-21 NOTE — Progress Notes (Signed)
PULMONARY / CRITICAL CARE MEDICINE   Name: [name] MRN: [id number] DOB: [birth year]    ADMISSION DATE:  10/20/2014 CONSULTATION DATE:  10/21/2014  REFERRING MD :  EDP  CHIEF COMPLAINT:  Respiratory failure  INITIAL PRESENTATION:  58 y.o. M brought to [facility] ED on 11/4 after being found down at his home when home health RN went to check on him.  There were apparently some pill bottles around him (Klonopin, Toprol).  In ED, was bradycardic and required atropine x 2.  Intubated for airway protection, PCCM consulted.  ?seizure and ?UTI.  STUDIES:  CT Head 11/4 >>> negative  SIGNIFICANT EVENTS: 11/4 - admit, intubated 11/5 seizures  -propofol gtt   SUBJECTIVE:  Denies pain Wide awake  Breathing well on PS 5/5   VITAL SIGNS: Temp:  [97.2 F (36.2 C)-98.3 F (36.8 C)] 98.1 F (36.7 C) (11/05 1724) Pulse Rate:  [52-117] 90 (11/05 1600) Resp:  [13-24] 24 (11/05 1600) BP: (68-153)/(37-134) 100/70 mmHg (11/05 1600) SpO2:  [93 %-100 %] 100 % (11/05 1600) FiO2 (%):  [40 %-100 %] 40 % (11/05 1345) Weight:  [197 lb (89.359 kg)-197 lb 6.4 oz (89.54 kg)] 197 lb (89.359 kg) (11/05 0500) HEMODYNAMICS:   VENTILATOR SETTINGS: Vent Mode:  [-] PSV;CPAP FiO2 (%):  [40 %-100 %] 40 % Set Rate:  [16 bmp] 16 bmp Vt Set:  [600 mL] 600 mL PEEP:  [5 cmH20] 5 cmH20 Pressure Support:  [5 cmH20] 5 cmH20 Plateau Pressure:  [15 cmH20-21 cmH20] 21 cmH20 INTAKE / OUTPUT: Intake/Output      11/04 0701 - 11/05 0700 11/05 0701 - 11/06 0700   I.V. (mL/kg) 3373.9 (37.8) 866.6 (9.7)   NG/GT  30   IV Piggyback 550    Total Intake(mL/kg) 3923.9 (43.9) 896.6 (10)   Urine (mL/kg/hr) 1515 845 (0.9)   Emesis/NG output  100 (0.1)   Total Output 1515 945   Net +2408.9 -48.4         PHYSICAL EXAMINATION: General: WDWN male,  No seizure activity x4h Neuro: awake, alert, non focal on vent. HEENT: [place]/AT. PERRL, sclerae anicteric. Cardiovascular: Bradycardic and regular, no M/R/G.  Lungs: Respirations even  and unlabored.  CTA bilaterally, No W/R/R. Abdomen: BS x 4, soft, NT/ND.  Musculoskeletal: No gross deformities, no edema.  Skin: Intact, warm, no rashes.  LABS:  CBC  Recent Labs Lab 10/20/14 1520 10/20/14 1535 10/20/14 2104 10/21/14 0243  WBC 6.7  --  6.4 11.2*  HGB 14.2 14.6 14.0 15.5  HCT 41.5 43.0 41.3 44.0  PLT 181  --  PLATELET CLUMPS NOTED ON SMEAR, UNABLE TO ESTIMATE 152   Coag's No results for input(s): APTT, INR in the last 168 hours. BMET  Recent Labs Lab 10/20/14 1535 10/20/14 2104 10/21/14 0243  NA 139  --  141  K 5.1  --  4.3  CL 106  --  107  CO2  --   --  20  BUN 16  --  14  CREATININE 1.00 0.86 0.86  GLUCOSE 108*  --  82   Electrolytes  Recent Labs Lab 10/21/14 0243  CALCIUM 8.7  MG 2.1  PHOS 2.5   Sepsis Markers  Recent Labs Lab 10/20/14 2057  LATICACIDVEN 1.9   ABG  Recent Labs Lab 10/20/14 1530 10/20/14 1754 10/21/14 0446  PHART 7.362 7.385 7.514*  PCO2ART 43.5 36.3 25.1*  PO2ART 60.0* 94.0 98.5   Liver Enzymes No results for input(s): AST, ALT, ALKPHOS, BILITOT, ALBUMIN in the last 168 hours.  Cardiac Enzymes No results for input(s): TROPONINI, PROBNP in the last 168 hours. Glucose  Recent Labs Lab 10/20/14 1529  GLUCAP 120*   Imaging Ct Head Wo Contrast  10/20/2014   CLINICAL DATA:  Patient found unresponsive earlier in the day  EXAM: CT HEAD WITHOUT CONTRAST  TECHNIQUE: Contiguous axial images were obtained from the base of the skull through the vertex without intravenous contrast.  COMPARISON:  October 31, 2013  FINDINGS: There is age related volume loss. There is no mass, hemorrhage, extra-axial fluid collection, or midline shift. Gray-white compartments appear normal. No acute infarct evident. Bony calvarium appears intact. The mastoid air cells are clear.  IMPRESSION: Age related volume loss. No intracranial mass, hemorrhage, or focal gray -white compartment lesion/acute appearing infarct.   Electronically Signed    By: Lowella Grip M.D.   On: 10/20/2014 15:55   Dg Chest Portable 1 View  10/20/2014   CLINICAL DATA:  Initial encounter for endotracheal tube placement.  EXAM: PORTABLE CHEST - 1 VIEW  COMPARISON:  10/31/2013.  FINDINGS: 1715 hrs. Endotracheal tube tip is 2.9 cm above the base of the chronic. Lung volumes are markedly low with vascular congestion and chronic atelectasis or scarring in right mid lung. No overt airspace pulmonary edema or focal lung consolidation. No pleural effusion. Telemetry leads overlie the chest. Surgical changes are noted in both shoulders.  IMPRESSION: Endotracheal tube tip is approximately 3 cm above the base of the chronic.  Low lung volumes vascular congestion possible component of interstitial edema.  Stable chronic atelectasis or scarring in right mid.   Electronically Signed   By: Misty Stanley M.D.   On: 10/20/2014 17:26   Dg Abd Portable 1v  10/20/2014   CLINICAL DATA:  Initial encounter for NG tube placement.  EXAM: PORTABLE ABDOMEN - 1 VIEW  COMPARISON:  None.  FINDINGS: Supine view of the abdomen obtained portably at 1929 hrs shows the tip of the NG tube in the mid stomach. Bowel gas pattern is nonspecific. Atelectasis is seen in the lung bases.  IMPRESSION: NG tube tip is in the mid stomach.   Electronically Signed   By: Misty Stanley M.D.   On: 10/20/2014 20:27    ASSESSMENT / PLAN:  PULMONARY OETT 11/4 >>> A: Acute respiratory failure - due to inability to protect airway likely from over medication. P:   Extubate   CARDIOVASCULAR A:  Sinus bradycardia (HR in high 50's) - possibly over medicated on toprol At risk QTc prolongation - on amitriptyline and seroquel Hx HTN, HLD P:  Chk monitor for QT prolongation  Hold outpatient Toprol, zocor.   RENAL A:  Oliguria- resolved  P:   NS @ 100. BMP in AM.   GASTROINTESTINAL A:   GERD Nutrition P:   NPO -advance post extubation   HEMATOLOGIC A:   VTE Prophylaxis P:  SCD's /  Heparin. CBC in AM.  INFECTIOUS A:   ?UTI P:   F/U on cultures Rocephine 11/4>>>  ENDOCRINE A:   No known issues P:   TSH 3.2   NEUROLOGIC A:   Acute toxic encephalopathy - likely due to over medication, head CT negative Hx Seizure disorder -dilantin therapeutic Hx anxiety and depression, schizoaffective disorder P:   dc Propofol gtt / Fentanyl drip. Continue outpatient phenytoin, PO via OGT Hold outpatient amitriptyline, clonazepam, quietiapine, ramelteon. Ativan for seizure, may need neuro consultation for seizure.  Family updated: None available.   Summary : Accidental OD of toprol and klonipin originally, later  hadseizure,with therapeutic dilantin level  - ? Due to seroquel  Once extubated will need to ascertain if OD was accidental or suicide related.  The patient is critically ill with multiple organ systems failure and requires high complexity decision making for assessment and support, frequent evaluation and titration of therapies, application of advanced monitoring technologies and extensive interpretation of multiple databases. Critical Care Time devoted to patient care services described in this note is 35 minutes.   Rigoberto Noel, MD

## 2014-10-21 NOTE — Significant Event (Signed)
An unused bag of fentanyl notified from previous shift has been returned to pharmacy. Given to Legrand Como in 2nd floor pharmacy.

## 2014-10-21 NOTE — Progress Notes (Signed)
EEG completed; results pending.    

## 2014-10-21 NOTE — Significant Event (Signed)
Pt is insisting on discharge because he has a cat @ home that needs attention. I have interviewed him in detail and explained that he is not deemed medically ready for discharge. However, I do believe that he is presently competent to make the decision to leave AMA if he still insists. We sought other solutions and he insists that there is no one who he can turn to to provide food and water for his cat. If he is able to ambulate, he may leave AMA and understands that he assumes all attendant risks of this decision   Merton Border, MD ; [facility] service Mobile [phone].  After 5:30 PM or weekends, call [phone]

## 2014-10-21 NOTE — Progress Notes (Signed)
Patient started having seizures, diprivan increased back to 64mcg, per MD, seizure activity then stopped

## 2014-10-21 NOTE — Significant Event (Signed)
Suicidal sitter at bedside prior to extubation. Environment cleared and checked for safety issues.

## 2014-10-21 NOTE — Progress Notes (Addendum)
Patient started experiencing tremors, had ELINK MD assess patient, Per MD, patient was seizing, patient was given the following:  Ativan 2mg  IV x 2 Propofol drip was restarted at 2mcg, bolus (70mg ) was given, then increased to 11mcg Dilantin levels ordered, will use propofol for seizure prophylaxis per MD

## 2014-10-22 DIAGNOSIS — T447X4D Poisoning by beta-adrenoreceptor antagonists, undetermined, subsequent encounter: Secondary | ICD-10-CM

## 2014-10-22 DIAGNOSIS — J9600 Acute respiratory failure, unspecified whether with hypoxia or hypercapnia: Secondary | ICD-10-CM | POA: Insufficient documentation

## 2014-10-22 DIAGNOSIS — T447X1A Poisoning by beta-adrenoreceptor antagonists, accidental (unintentional), initial encounter: Secondary | ICD-10-CM

## 2014-10-22 DIAGNOSIS — T424X4D Poisoning by benzodiazepines, undetermined, subsequent encounter: Secondary | ICD-10-CM

## 2014-10-22 DIAGNOSIS — T424X1A Poisoning by benzodiazepines, accidental (unintentional), initial encounter: Secondary | ICD-10-CM

## 2014-10-22 LAB — PROCALCITONIN: Procalcitonin: <0.1 ng/mL

## 2014-10-22 LAB — URINE CULTURE
Colony Count: NO GROWTH
Culture: NO GROWTH

## 2014-10-22 LAB — GLUCOSE, CAPILLARY: Glucose-Capillary: 202 mg/dL — ABNORMAL HIGH (ref 70–99)

## 2014-10-22 MED ORDER — PANTOPRAZOLE SODIUM 40 MG PO TBEC
40.0000 mg | DELAYED_RELEASE_TABLET | Freq: Every day | ORAL | Status: DC
  Administered 2014-10-22 – 2014-10-25 (×4): 40 mg via ORAL
  Filled 2014-10-22 (×4): qty 1

## 2014-10-22 NOTE — Progress Notes (Signed)
Admission note:  Arrival Method: Patient arrived in w/c accompanied by the staff from 23M. Mental Orientation:  Alert and oriented x 4. Assessment: See doc flow sheets. Skin: Warm, dry and intact. IV: Access present. Pain: Denies any pain. Fall Prevention Safety Plan:  Patient  Admission Screening: In Progress. 6700 Orientation: Patient has been oriented to the unit, staff and to the room.

## 2014-10-22 NOTE — Progress Notes (Signed)
PULMONARY / CRITICAL CARE MEDICINE   Name: [name] MRN: [id number] DOB: Jul 24, 1956    ADMISSION DATE:  10/20/2014 CONSULTATION DATE:  10/22/2014  REFERRING MD :  EDP  CHIEF COMPLAINT:  Respiratory failure  INITIAL PRESENTATION:  58 y.o. M brought to [facility] ED on 11/4 after being found down at his home when home health RN went to check on him.  There were apparently some pill bottles around him (Klonopin, Toprol).  In ED, was bradycardic and required atropine x 2.  Intubated for airway protection, PCCM consulted.  ?seizure and ?UTI.  STUDIES:  CT Head 11/4 >>> negative  SIGNIFICANT EVENTS: 11/4 - admit, intubated 11/5 seizures  -propofol gtt -> EXTUBATED   SUBJECTIVE/OVERNIGHT/INTERVAL HX  10/22/14: Almost signed out AMA yesterday and was agitated but after  Haldol improved. Psych consult pending . He is asking me if there is "food in his lungs".    VITAL SIGNS: Temp:  [98.1 F (36.7 C)-98.6 F (37 C)] 98.6 F (37 C) (11/06 1134) Pulse Rate:  [78-116] 101 (11/06 1200) Resp:  [13-32] 17 (11/06 1200) BP: (100-159)/(60-89) 123/78 mmHg (11/06 1200) SpO2:  [87 %-100 %] 96 % (11/06 1200) FiO2 (%):  [40 %] 40 % (11/05 1345) Weight:  [89.7 kg (197 lb 12 oz)] 89.7 kg (197 lb 12 oz) (11/06 0500) HEMODYNAMICS:   VENTILATOR SETTINGS: Vent Mode:  [-]  FiO2 (%):  [40 %] 40 % INTAKE / OUTPUT: Intake/Output      11/05 0701 - 11/06 0700 11/06 0701 - 11/07 0700   P.O.  520   I.V. (mL/kg) 866.6 (9.7)    NG/GT 30    IV Piggyback 154    Total Intake(mL/kg) 1050.6 (11.7) 520 (5.8)   Urine (mL/kg/hr) 1545 (0.7)    Emesis/NG output 100 (0)    Total Output 1645     Net -594.4 +520        Urine Occurrence 1 x     PHYSICAL EXAMINATION: General:looks chronic unwell. No distress Neuro: awake, alert, non focal , eating, sitter at bedside HEENT: [place]/AT. PERRL, sclerae anicteric. Cardiovascular: and regular, no M/R/G.  Lungs: Respirations even and unlabored.  CTA bilaterally, No  W/R/R. Abdomen: BS x 4, soft, NT/ND.  Musculoskeletal: No gross deformities, no edema.  Skin: Intact, warm, no rashes.  LABS: PULMONARY  Recent Labs Lab 10/20/14 1530 10/20/14 1535 10/20/14 1754 10/21/14 0446  PHART 7.362  --  7.385 7.514*  PCO2ART 43.5  --  36.3 25.1*  PO2ART 60.0*  --  94.0 98.5  HCO3 24.7*  --  21.7 20.1  TCO2 26 24 23  20.9  O2SAT 89.0  --  97.0 98.1    CBC  Recent Labs Lab 10/20/14 1520 10/20/14 1535 10/20/14 2104 10/21/14 0243  HGB 14.2 14.6 14.0 15.5  HCT 41.5 43.0 41.3 44.0  WBC 6.7  --  6.4 11.2*  PLT 181  --  PLATELET CLUMPS NOTED ON SMEAR, UNABLE TO ESTIMATE 152    COAGULATION No results for input(s): INR in the last 168 hours.  CARDIAC  No results for input(s): TROPONINI in the last 168 hours. No results for input(s): PROBNP in the last 168 hours.   CHEMISTRY  Recent Labs Lab 10/20/14 1535 10/20/14 2104 10/21/14 0243  NA 139  --  141  K 5.1  --  4.3  CL 106  --  107  CO2  --   --  20  GLUCOSE 108*  --  82  BUN 16  --  14  CREATININE 1.00 0.86 0.86  CALCIUM  --   --  8.7  MG  --   --  2.1  PHOS  --   --  2.5   Estimated Creatinine Clearance: 97.3 mL/min (by C-G formula based on Cr of 0.86).   LIVER No results for input(s): AST, ALT, ALKPHOS, BILITOT, PROT, ALBUMIN, INR in the last 168 hours.   INFECTIOUS  Recent Labs Lab 10/20/14 2057  LATICACIDVEN 1.9     ENDOCRINE CBG (last 3)   Recent Labs  10/20/14 1529 10/21/14 2100  GLUCAP 120* 202*         IMAGING x48h Ct Head Wo Contrast  10/20/2014   CLINICAL DATA:  Patient found unresponsive earlier in the day  EXAM: CT HEAD WITHOUT CONTRAST  TECHNIQUE: Contiguous axial images were obtained from the base of the skull through the vertex without intravenous contrast.  COMPARISON:  October 31, 2013  FINDINGS: There is age related volume loss. There is no mass, hemorrhage, extra-axial fluid collection, or midline shift. Gray-white compartments appear  normal. No acute infarct evident. Bony calvarium appears intact. The mastoid air cells are clear.  IMPRESSION: Age related volume loss. No intracranial mass, hemorrhage, or focal gray -white compartment lesion/acute appearing infarct.   Electronically Signed   By: Lowella Grip M.D.   On: 10/20/2014 15:55   Dg Chest Port 1 View  10/21/2014   CLINICAL DATA:  Acute respiratory failure  EXAM: PORTABLE CHEST - 1 VIEW  COMPARISON:  Portable chest x-ray dated October 20, 2014  FINDINGS: The lung volumes remain low. The interstitial markings are less conspicuous today. The cardiac silhouette is normal in size. The pulmonary vascularity is not engorged. There is no pneumothorax or significant pleural effusion.  The endotracheal tube tip lies 1.7 cm above the crotch of the carina. The esophagogastric tube tip in proximal port project below the GE junction.  IMPRESSION: There is persistent bilateral hypoinflation but the pulmonary interstitium has improved consistent with resolving interstitial edema. The endotracheal tube tip lies approximately 1.7 cm above the crotch of the carina and withdrawal by approximately 2 cm is recommended to avoid accidental mainstem bronchus intubation with patient movement.  Critical Value/emergent results were called by telephone at the time of interpretation to [facility] Ksor, RN, on 10/21/2014 at 8:02 am to who verbally acknowledged these results.   Electronically Signed   By: David  Martinique   On: 10/21/2014 08:06   Dg Chest Portable 1 View  10/20/2014   CLINICAL DATA:  Initial encounter for endotracheal tube placement.  EXAM: PORTABLE CHEST - 1 VIEW  COMPARISON:  10/31/2013.  FINDINGS: 1715 hrs. Endotracheal tube tip is 2.9 cm above the base of the chronic. Lung volumes are markedly low with vascular congestion and chronic atelectasis or scarring in right mid lung. No overt airspace pulmonary edema or focal lung consolidation. No pleural effusion. Telemetry leads overlie the chest.  Surgical changes are noted in both shoulders.  IMPRESSION: Endotracheal tube tip is approximately 3 cm above the base of the chronic.  Low lung volumes vascular congestion possible component of interstitial edema.  Stable chronic atelectasis or scarring in right mid.   Electronically Signed   By: Misty Stanley M.D.   On: 10/20/2014 17:26   Dg Abd Portable 1v  10/20/2014   CLINICAL DATA:  Initial encounter for NG tube placement.  EXAM: PORTABLE ABDOMEN - 1 VIEW  COMPARISON:  None.  FINDINGS: Supine view of the abdomen obtained portably at 1929 hrs shows the  tip of the NG tube in the mid stomach. Bowel gas pattern is nonspecific. Atelectasis is seen in the lung bases.  IMPRESSION: NG tube tip is in the mid stomach.   Electronically Signed   By: Misty Stanley M.D.   On: 10/20/2014 20:27        ASSESSMENT / PLAN:  PULMONARY OETT 11/4 >>>10/21/14 A: Acute respiratory failure - due to inability to protect airway likely from over medication.s/p extubation 10/21/14. DOing well 10/22/14  P:   monitor   CARDIOVASCULAR A:  Hx HTN, HLD Sinus bradycardia (HR in high 50's) - possibly over medicated on toprol At risk QTc prolongation - on amitriptyline and seroquel   - sinus. Normal QTc 10/21/14  P:  Chk EKG 10/23/14  for QT prolongation  Hold outpatient Toprol, zocor.   RENAL A:  Oliguria- resolved  P:   Dc normal saline; make saline lock BMP in AM.   GASTROINTESTINAL A:   GERD Nutrition P:   diet   HEMATOLOGIC A:   VTE Prophylaxis P:  SCD's / Heparin. CBC in AM.  INFECTIOUS A:   ?UTI P:   F/U on cultures Rocephine 11/4>> Check PCT to limit abx exposure  ENDOCRINE A:   No known issues P:   TSH 3.2   NEUROLOGIC A:   Acute toxic encephalopathy - likely due to over medication, head CT negative Hx Seizure disorder -dilantin therapeutic Hx anxiety and depression, schizoaffective disorder   - possible OD. Psychotic behavior 10/21/14 . [place] 10/22/14 . Sitter at  bedside  P:   Continue outpatient phenytoin, Hold outpatient amitriptyline, clonazepam, quietiapine, ramelteon. Ativan for seizure, may need neuro consultation for seizure. Psych cnsult pending   Family updated: None available.   Summary : Post extubation unable to ascertain if suicidal OD. HE was very paranoid. Psych consult pending. Move to floor under triad with sitter at bedside. PCCM off from 10/23/14    Dr. Brand Males, M.D., [facility].C.P Pulmonary and Critical Care Medicine Staff Physician [facility] Pager: [phone], If no answer or between  15:00h - 7:00h: call [phone]  10/22/2014 1:55 PM

## 2014-10-22 NOTE — Progress Notes (Signed)
Report given to 6E RN-informed her patient unsteady on feet, IVC paperwork in chart. Also psych will be assessing pt 11/6 pm. Per psych. Vitals stable, sitter at bedside. Belongings handed to bedside RN Alden Server. No wallet or keys in 2100 possession

## 2014-10-22 NOTE — Progress Notes (Signed)
MEDICATION RELATED CONSULT NOTE - INITIAL   Pharmacy Consult for Phenytoin Indication: History of seizures  Allergies  Allergen Reactions  . Codeine Nausea Only  . Sulfa Antibiotics Nausea And Vomiting    Patient Measurements: Height: 5\' 11"  (180.3 cm) Weight: 197 lb 12 oz (89.7 kg) IBW/kg (Calculated) : 75.3  Vital Signs: Temp: 98.6 F (37 C) (11/06 1134) Temp Source: Oral (11/06 1134) BP: 123/78 mmHg (11/06 1200) Pulse Rate: 101 (11/06 1200) Intake/Output from previous day: 11/05 0701 - 11/06 0700 In: 1050.6 [I.V.:866.6; NG/GT:30; IV Piggyback:154] Out: 7846 [Urine:1545; Emesis/NG output:100] Intake/Output from this shift: Total I/O In: 760 [P.O.:760] Out: -   Labs:  Recent Labs  10/20/14 1520 10/20/14 1535 10/20/14 2104 10/21/14 0243  WBC 6.7  --  6.4 11.2*  HGB 14.2 14.6 14.0 15.5  HCT 41.5 43.0 41.3 44.0  PLT 181  --  PLATELET CLUMPS NOTED ON SMEAR, UNABLE TO ESTIMATE 152  CREATININE  --  1.00 0.86 0.86  MG  --   --   --  2.1  PHOS  --   --   --  2.5   Estimated Creatinine Clearance: 97.3 mL/min (by C-G formula based on Cr of 0.86).   Microbiology: Recent Results (from the past 720 hour(s))  MRSA PCR Screening     Status: None   Collection Time: 10/20/14  8:06 PM  Result Value Ref Range Status   MRSA by PCR NEGATIVE NEGATIVE Final    Comment:        The GeneXpert MRSA Assay (FDA approved for NASAL specimens only), is one component of a comprehensive MRSA colonization surveillance program. It is not intended to diagnose MRSA infection nor to guide or monitor treatment for MRSA infections.   Culture, Urine     Status: None   Collection Time: 10/21/14  4:59 AM  Result Value Ref Range Status   Specimen Description URINE, CLEAN CATCH  Final   Special Requests NONE  Final   Culture  Setup Time   Final    10/21/2014 11:02 Performed at [place] Performed at [facility]   Final   Culture NO  GROWTH Performed at [facility]   Final   Report Status 10/22/2014 FINAL  Final    Medical History: Past Medical History  Diagnosis Date  . Seizures   . Hypertension   . GERD (gastroesophageal reflux disease)   . Dyslipidemia   . Schizoaffective disorder   . Depression   . Anxiety   . MRSA carrier     Medications:  Scheduled:  . amitriptyline  100 mg Oral QHS  . cefTRIAXone (ROCEPHIN)  IV  1 g Intravenous Q24H  . heparin  5,000 Units Subcutaneous 3 times per day  . pantoprazole  40 mg Oral Q1200  . phenytoin (DILANTIN) IV  200 mg Intravenous QHS  . QUEtiapine  600 mg Oral QHS    Assessment: 47 yoM with a history of seizures on phenytoin prior to arrival. He was admitted to the ICU secondary to a possible accidental overdose on Clonazepam and Toprol XL.  Pharmacy was consulted to monitor phenytoin. His phenytoin level was 14.3 on 11/5 with an albumin of 4.4. He had some possible seizure like activity on 11/5 while weaning propofol.  No seizure activity noted since.  Since he was within the goal of 10-20, will continue his home dose.  Goal of Therapy:  Phenytoin Level 10-20  Plan:  1) Continue  Phenytoin 200 mg IV QHS 2) Monitor for seizures and check levels as indicated   Theron Arista, PharmD Clinical Pharmacist - Resident Pager: [phone] 11/6/20152:33 PM

## 2014-10-23 ENCOUNTER — Encounter (HOSPITAL_COMMUNITY): Payer: Self-pay | Admitting: *Deleted

## 2014-10-23 DIAGNOSIS — I10 Essential (primary) hypertension: Secondary | ICD-10-CM

## 2014-10-23 DIAGNOSIS — G40909 Epilepsy, unspecified, not intractable, without status epilepticus: Secondary | ICD-10-CM

## 2014-10-23 DIAGNOSIS — F259 Schizoaffective disorder, unspecified: Secondary | ICD-10-CM | POA: Insufficient documentation

## 2014-10-23 LAB — CBC WITH DIFFERENTIAL/PLATELET
Basophils Absolute: 0 10*3/uL (ref 0.0–0.1)
Basophils Relative: 1 % (ref 0–1)
Eosinophils Absolute: 0.2 10*3/uL (ref 0.0–0.7)
Eosinophils Relative: 3 % (ref 0–5)
HCT: 42.6 % (ref 39.0–52.0)
Hemoglobin: 14.9 g/dL (ref 13.0–17.0)
Lymphocytes Relative: 32 % (ref 12–46)
Lymphs Abs: 1.5 10*3/uL (ref 0.7–4.0)
MCH: 33 pg (ref 26.0–34.0)
MCHC: 35 g/dL (ref 30.0–36.0)
MCV: 94.5 fL (ref 78.0–100.0)
Monocytes Absolute: 0.5 10*3/uL (ref 0.1–1.0)
Monocytes Relative: 10 % (ref 3–12)
Neutro Abs: 2.5 10*3/uL (ref 1.7–7.7)
Neutrophils Relative %: 54 % (ref 43–77)
Platelets: 148 10*3/uL — ABNORMAL LOW (ref 150–400)
RBC: 4.51 MIL/uL (ref 4.22–5.81)
RDW: 15.2 % (ref 11.5–15.5)
WBC: 4.7 10*3/uL (ref 4.0–10.5)

## 2014-10-23 LAB — BASIC METABOLIC PANEL
Anion gap: 18 — ABNORMAL HIGH (ref 5–15)
BUN: 8 mg/dL (ref 6–23)
CO2: 18 mEq/L — ABNORMAL LOW (ref 19–32)
Calcium: 9.2 mg/dL (ref 8.4–10.5)
Chloride: 104 mEq/L (ref 96–112)
Creatinine, Ser: 0.87 mg/dL (ref 0.50–1.35)
GFR calc Af Amer: >90 mL/min (ref >90)
GFR calc non Af Amer: >90 mL/min (ref >90)
Glucose, Bld: 106 mg/dL — ABNORMAL HIGH (ref 70–99)
Potassium: 4 mEq/L (ref 3.7–5.3)
Sodium: 140 mEq/L (ref 137–147)

## 2014-10-23 LAB — PROCALCITONIN: Procalcitonin: <0.1 ng/mL

## 2014-10-23 LAB — PHOSPHORUS: Phosphorus: 4.5 mg/dL (ref 2.3–4.6)

## 2014-10-23 LAB — MAGNESIUM: Magnesium: 2.4 mg/dL (ref 1.5–2.5)

## 2014-10-23 MED ORDER — METOPROLOL SUCCINATE ER 100 MG PO TB24
100.0000 mg | ORAL_TABLET | Freq: Every day | ORAL | Status: DC
  Administered 2014-10-23: 100 mg via ORAL
  Filled 2014-10-23 (×2): qty 1

## 2014-10-23 MED ORDER — PHENYTOIN 50 MG PO CHEW: 200.0000 mg | CHEWABLE_TABLET | Freq: Every day | ORAL | Status: DC

## 2014-10-23 MED ORDER — PHENYTOIN 50 MG PO CHEW
200.0000 mg | CHEWABLE_TABLET | Freq: Every day | ORAL | Status: DC
  Administered 2014-10-23 – 2014-10-24 (×2): 200 mg via ORAL
  Filled 2014-10-23 (×3): qty 4

## 2014-10-23 MED ORDER — QUETIAPINE FUMARATE 300 MG PO TABS
900.0000 mg | ORAL_TABLET | Freq: Every day | ORAL | Status: DC
  Administered 2014-10-23 – 2014-10-24 (×2): 900 mg via ORAL
  Filled 2014-10-23 (×3): qty 3

## 2014-10-23 MED ORDER — RAMELTEON 8 MG PO TABS
8.0000 mg | ORAL_TABLET | Freq: Every day | ORAL | Status: DC
  Administered 2014-10-23 – 2014-10-24 (×2): 8 mg via ORAL
  Filled 2014-10-23 (×3): qty 1

## 2014-10-23 MED ORDER — CLONAZEPAM 1 MG PO TABS
2.0000 mg | ORAL_TABLET | Freq: Four times a day (QID) | ORAL | Status: DC
  Administered 2014-10-23: 2 mg via ORAL
  Filled 2014-10-23: qty 2

## 2014-10-23 MED ORDER — AMITRIPTYLINE HCL 50 MG PO TABS
350.0000 mg | ORAL_TABLET | Freq: Every day | ORAL | Status: DC
  Administered 2014-10-23 – 2014-10-24 (×2): 350 mg via ORAL
  Filled 2014-10-23 (×3): qty 1

## 2014-10-23 MED ORDER — CLONAZEPAM 1 MG PO TABS
1.0000 mg | ORAL_TABLET | Freq: Four times a day (QID) | ORAL | Status: DC
  Administered 2014-10-23: 1 mg via ORAL
  Filled 2014-10-23 (×2): qty 1

## 2014-10-23 MED ORDER — LEVETIRACETAM 500 MG PO TABS
500.0000 mg | ORAL_TABLET | Freq: Two times a day (BID) | ORAL | Status: DC
  Administered 2014-10-23 – 2014-10-25 (×4): 500 mg via ORAL
  Filled 2014-10-23 (×5): qty 1
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
## 2014-10-24 DIAGNOSIS — R001 Bradycardia, unspecified: Secondary | ICD-10-CM

## 2014-10-24 LAB — CBC
HCT: 40.9 % (ref 39.0–52.0)
Hemoglobin: 13.6 g/dL (ref 13.0–17.0)
MCH: 31.6 pg (ref 26.0–34.0)
MCHC: 33.3 g/dL (ref 30.0–36.0)
MCV: 95.1 fL (ref 78.0–100.0)
Platelets: 152 10*3/uL (ref 150–400)
RBC: 4.3 MIL/uL (ref 4.22–5.81)
RDW: 15.1 % (ref 11.5–15.5)
WBC: 5.2 10*3/uL (ref 4.0–10.5)

## 2014-10-24 LAB — BASIC METABOLIC PANEL
Anion gap: 13 (ref 5–15)
BUN: 13 mg/dL (ref 6–23)
CO2: 23 mEq/L (ref 19–32)
Calcium: 9.2 mg/dL (ref 8.4–10.5)
Chloride: 106 mEq/L (ref 96–112)
Creatinine, Ser: 0.97 mg/dL (ref 0.50–1.35)
GFR calc Af Amer: >90 mL/min (ref >90)
GFR calc non Af Amer: 88 mL/min — ABNORMAL LOW (ref >90)
Glucose, Bld: 110 mg/dL — ABNORMAL HIGH (ref 70–99)
Potassium: 4.1 mEq/L (ref 3.7–5.3)
Sodium: 142 mEq/L (ref 137–147)

## 2014-10-24 MED ORDER — CLONAZEPAM 1 MG PO TABS
2.0000 mg | ORAL_TABLET | Freq: Four times a day (QID) | ORAL | Status: DC
  Administered 2014-10-24 – 2014-10-25 (×5): 2 mg via ORAL
  Filled 2014-10-24 (×4): qty 2

## 2014-10-24 MED ORDER — SENNA 8.6 MG PO TABS
1.0000 | ORAL_TABLET | Freq: Every day | ORAL | Status: DC
  Administered 2014-10-24 – 2014-10-25 (×2): 8.6 mg via ORAL
  Filled 2014-10-24 (×2): qty 1

## 2014-10-24 MED ORDER — LEVETIRACETAM 500 MG PO TABS: 500.0000 mg | ORAL_TABLET | Freq: Two times a day (BID) | ORAL | Status: DC

## 2014-10-24 MED ORDER — ZOLPIDEM TARTRATE 5 MG PO TABS
5.0000 mg | ORAL_TABLET | Freq: Every evening | ORAL | Status: DC | PRN
  Administered 2014-10-24 (×2): 5 mg via ORAL
  Filled 2014-10-24 (×2): qty 1

## 2014-10-24 MED ORDER — METOPROLOL SUCCINATE ER 50 MG PO TB24
50.0000 mg | ORAL_TABLET | Freq: Every day | ORAL | Status: DC
  Administered 2014-10-24: 50 mg via ORAL
  Filled 2014-10-24 (×2): qty 1

## 2014-10-24 MED ORDER — METOPROLOL SUCCINATE ER 50 MG PO TB24: 50.0000 mg | ORAL_TABLET | Freq: Every day | ORAL | Status: DC

## 2014-10-24 NOTE — Plan of Care (Signed)
Problem: Phase I Progression Outcomes Goal: Hemodynamically stable Outcome: Progressing BP decreased this AM. MD decreased HS metroprolol dose. Will continue to monitor.

## 2014-10-24 NOTE — Plan of Care (Signed)
Problem: Consults Goal: Diabetes Guidelines if Diabetic/Glucose > 140 If diabetic or lab glucose is > 140 mg/dl - Initiate Diabetes/Hyperglycemia Guidelines & Document Interventions  Outcome: Not Applicable Date Met:  10/24/14     
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
## 2014-10-24 NOTE — Progress Notes (Signed)
Pt confirmed need to follow up with [facility] through the [facility] in [place]. Manya Silvas, RN

## 2014-10-24 NOTE — Plan of Care (Signed)
Problem: Consults Goal: Social Work Consult if indicated Outcome: Completed/Met Date Met:  10/24/14 Pt verbalized transportation issues, medication administration needs, and needing to connect with counselor at [facility].

## 2014-10-24 NOTE — Plan of Care (Signed)
Problem: Phase I Progression Outcomes Goal: Voiding-avoid urinary catheter unless indicated Outcome: Completed/Met Date Met:  10/24/14 Pt denies problems with urination.

## 2014-10-24 NOTE — Plan of Care (Signed)
Problem: Phase I Progression Outcomes Goal: OOB as tolerated unless otherwise ordered Outcome: Completed/Met Date Met:  10/24/14 Pt able to sit on side of bed and ambulate to bathroom without difficulty.

## 2014-10-25 DIAGNOSIS — F259 Schizoaffective disorder, unspecified: Secondary | ICD-10-CM

## 2014-10-25 NOTE — Progress Notes (Signed)
[place] for Phenytoin Indication: History of seizures  Allergies  Allergen Reactions  . Codeine Nausea Only  . Sulfa Antibiotics Nausea And Vomiting    Patient Measurements: Height: 5\' 11"  (180.3 cm) Weight: 190 lb 1.6 oz (86.229 kg) IBW/kg (Calculated) : 75.3  Vital Signs: Temp: 97 F (36.1 C) (11/09 0900) Temp Source: Oral (11/09 0900) BP: 110/76 mmHg (11/09 0900) Pulse Rate: 75 (11/09 0900) Intake/Output from previous day: 11/08 0701 - 11/09 0700 In: 840 [P.O.:840] Out: 2 [Urine:2] Intake/Output from this shift:    Labs:  Recent Labs  10/23/14 0442 10/24/14 0720  WBC 4.7 5.2  HGB 14.9 13.6  HCT 42.6 40.9  PLT 148* 152  CREATININE 0.87 0.97  MG 2.4  --   PHOS 4.5  --    Estimated Creatinine Clearance: 86.3 mL/min (by C-G formula based on Cr of 0.97).  Assessment: 37 yoM with a history of seizures on phenytoin prior to arrival. He was admitted to the ICU secondary to a possible accidental overdose on Clonazepam and Toprol XL.  Pharmacy was consulted to monitor phenytoin. His phenytoin level was 14.3 on 11/5 with an albumin of 4.4 (from 10/28). He had some possible seizure like activity on 11/5 while weaning propofol, however no seizure activity noted since. Currently on phenytoin 200mg  chewable tablets qhs.  Goal of Therapy:  Phenytoin Level 10-20  Plan:  1) Continue Phenytoin 200mg  chewable qhs 2) will obtain a phenytoin level and albumin on 11/12 to ensure his dose is adequate  Keishla Oyer D. Chyrel Taha, PharmD, BCPS Clinical Pharmacist Pager: [phone] 10/25/2014 1:55 PM
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
## 2014-10-25 NOTE — Discharge Instructions (Signed)
-   Please follow up with your PCP and neurologist soon after discharge. - Please call [facility] to set up an appointment. - We are cutting your dose of Metoprolol in half due to your low blood pressure. You will be taking 50mg /day.  Your PCP can re-address this at your follow up visit. - We are adding another medication (Keppra to prevent further seizures) - Please consider keeping your medications in a pill box to prevent confusion.  Epilepsy People with epilepsy have times when they shake and jerk uncontrollably (seizures). This happens when there is a sudden change in brain function. Epilepsy may have many possible causes. Anything that disturbs the normal pattern of brain cell activity can lead to seizures. HOME CARE   Follow your doctor's instructions about driving and safety during normal activities.  Get enough sleep.  Only take medicine as told by your doctor.  Avoid things that you know can cause you to have seizures (triggers).  Write down when your seizures happen and what you remember about each seizure. Write down anything you think may have caused the seizure to happen.  Tell the people you live and work with that you have seizures. Make sure they know how to help you. They should:  Cushion your head and body.  Turn you on your side.  Not restrain you.  Not place anything inside your mouth.  Call for local emergency medical help if there is any question about what has happened.  Keep all follow-up visits with your doctor. This is very important. GET HELP IF:  You get an infection or start to feel sick. You may have more seizures when you are sick.  You are having seizures more often.  Your seizure pattern is changing. GET HELP RIGHT AWAY IF:   A seizure does not stop after a few seconds or minutes.  A seizure causes you to have trouble breathing.  A seizure gives you a very bad headache.  A seizure makes you unable to speak or use a part of your  body. Document Released: 09/30/2009 Document Revised: 09/23/2013 Document Reviewed: 07/15/2013 [facility] Patient Information 2015 [place], [place]. This information is not intended to replace advice given to you by your health care provider. Make sure you discuss any questions you have with your health care provider.

## 2014-10-25 NOTE — Consult Note (Signed)
[name]   Reason for Consult:  Medication overdose, AMS and history of schizoaffective disorer Referring Physician:  Dr. Ted Mcalpine is an 58 y.o. male. Total Time spent with patient: 45 minutes  Assessment: AXIS I:  Schizoaffective Disorder AXIS II:  Deferred AXIS III:   Past Medical History  Diagnosis Date  . Seizures   . Hypertension   . GERD (gastroesophageal reflux disease)   . Dyslipidemia   . Schizoaffective disorder   . Depression   . Anxiety   . MRSA carrier    AXIS IV:  economic problems, occupational problems, other psychosocial or environmental problems, problems related to social environment and problems with primary support group AXIS V:  31-40 impairment in reality testing  Plan:  Continue current psychiatric treatment and medication management Refer to out patient psychiatric treatment at [name] and counseling when medically stable No evidence of imminent risk to self or others at present.   Supportive therapy provided about ongoing stressors.  Appreciate psychiatric consultation Please contact [phone] or [phone] if needs further assistance   Subjective:   [name] is a 58 y.o. male patient admitted with medication overdose, AMS and history of schizoaffective disorer.  HPI:  Patient has stated that he does not remember what happened and states he might have unintentional medication overdose. He has been with improved cognition and mental status at this time and denied current symptoms of depression, anxiety, psychosis and suicidal or homicidal ideation, intention or plans. Patient has normal mental status and cognition and contract for safety. Patient has plans to follow up with out patient treatment and home health care. He has plans of getting pill box to monitor his medication administration. Recommend to contact his pharmacy for possible bubble packing his medications if possible. Reviewe below info for  further details.  [name] is a 58 y.o. M with PMH as outlined below. His home health RN went to check on him afternoon of 11/4 and found him essentially non-responsive. There were apparently some pill bottles around him (Klonopin and Toprol). Upon ED arrival, pt was bradycardic and required 2 doses of atropine. He was also given 1 gram of calcium gluconate for concern of cardiac instability given pt's med list of psychiatric drugs (amitriptyline, seroquel). Due to concern of inability to protect airway, pt was intubated by EDP.Pt's best friend arrived later and per RN, he reported that pt had recently informed him that he wasn't sure how much medication he ws supposed to be taking. He denies any instance of pt having suicidal ideations or talking about hurting himself. Apparently, they were going to go on a trip to [place] soon. Pt's friend thinks that pt just wasn't sure how much to take and may have accidentally taken too many pills. He informed RN that he had considered asking pt to move in since there was concern whether pt could safely take care of himself.   PAST MEDICAL HISTORY :   has a past medical history of Seizures; Hypertension; GERD (gastroesophageal reflux disease); Dyslipidemia; Schizoaffective disorder; Depression; Anxiety; and MRSA carrier.  has past surgical history that includes self orchectomy; Colonoscopy (2010); ORIF shoulder fracture; Shoulder Closed Reduction (Left, 09/16/2013); and Shoulder hemi-arthroplasty (Left, 09/18/2013).  HPI Elements:   Location:  unintentional overdose. Quality:  moderate. Severity:  improved mental status. Timing:  unknown. Duration:  one day..  Past Psychiatric History: Past Medical History  Diagnosis Date  . Seizures   . Hypertension   .  GERD (gastroesophageal reflux disease)   . Dyslipidemia   . Schizoaffective disorder   . Depression   . Anxiety   . MRSA carrier     reports that he has never smoked. He has never used  smokeless tobacco. He reports that he does not drink alcohol or use illicit drugs. Family History  Problem Relation Age of Onset  . Stroke Father     Living at 96  . Stroke Mother     Stroke in late 62's. Died in her 84's     Living Arrangements: Alone   Abuse/Neglect [facility]) Physical Abuse: Denies Verbal Abuse: Denies Sexual Abuse: Denies Allergies:   Allergies  Allergen Reactions  . Codeine Nausea Only  . Sulfa Antibiotics Nausea And Vomiting    ACT Assessment Complete:  NO Objective: Blood pressure 106/60, pulse 65, temperature 97.8 F (36.6 C), temperature source Oral, resp. rate 18, height 5' 11" (1.803 m), weight 86.229 kg (190 lb 1.6 oz), SpO2 96 %.Body mass index is 26.53 kg/(m^2). Results for orders placed or performed during the hospital encounter of 10/20/14 (from the past 72 hour(s))  Procalcitonin - Baseline     Status: None   Collection Time: 10/22/14  3:00 PM  Result Value Ref Range   Procalcitonin <0.10 ng/mL    Comment:        Interpretation: PCT (Procalcitonin) <= 0.5 ng/mL: Systemic infection (sepsis) is not likely. Local bacterial infection is possible. (NOTE)         ICU PCT Algorithm               Non ICU PCT Algorithm    ----------------------------     ------------------------------         PCT < 0.25 ng/mL                 PCT < 0.1 ng/mL     Stopping of antibiotics            Stopping of antibiotics       strongly encouraged.               strongly encouraged.    ----------------------------     ------------------------------       PCT level decrease by               PCT < 0.25 ng/mL       >= 80% from peak PCT       OR PCT 0.25 - 0.5 ng/mL          Stopping of antibiotics                                             encouraged.     Stopping of antibiotics           encouraged.    ----------------------------     ------------------------------       PCT level decrease by              PCT >= 0.25 ng/mL       < 80% from peak PCT        AND PCT >=  0.5 ng/mL            Continuin g antibiotics  encouraged.       Continuing antibiotics            encouraged.    ----------------------------     ------------------------------     PCT level increase compared          PCT > 0.5 ng/mL         with peak PCT AND          PCT >= 0.5 ng/mL             Escalation of antibiotics                                          strongly encouraged.      Escalation of antibiotics        strongly encouraged.   Basic metabolic panel     Status: Abnormal   Collection Time: 10/23/14  4:42 AM  Result Value Ref Range   Sodium 140 137 - 147 mEq/L   Potassium 4.0 3.7 - 5.3 mEq/L   Chloride 104 96 - 112 mEq/L   CO2 18 (L) 19 - 32 mEq/L   Glucose, Bld 106 (H) 70 - 99 mg/dL   BUN 8 6 - 23 mg/dL   Creatinine, Ser 0.87 0.50 - 1.35 mg/dL   Calcium 9.2 8.4 - 10.5 mg/dL   GFR calc non Af Amer >90 >90 mL/min   GFR calc Af Amer >90 >90 mL/min    Comment: (NOTE) The eGFR has been calculated using the CKD EPI equation. This calculation has not been validated in all clinical situations. eGFR's persistently <90 mL/min signify possible Chronic Kidney Disease.    Anion gap 18 (H) 5 - 15  Procalcitonin     Status: None   Collection Time: 10/23/14  4:42 AM  Result Value Ref Range   Procalcitonin <0.10 ng/mL    Comment:        Interpretation: PCT (Procalcitonin) <= 0.5 ng/mL: Systemic infection (sepsis) is not likely. Local bacterial infection is possible. (NOTE)         ICU PCT Algorithm               Non ICU PCT Algorithm    ----------------------------     ------------------------------         PCT < 0.25 ng/mL                 PCT < 0.1 ng/mL     Stopping of antibiotics            Stopping of antibiotics       strongly encouraged.               strongly encouraged.    ----------------------------     ------------------------------       PCT level decrease by               PCT < 0.25 ng/mL       >= 80% from peak  PCT       OR PCT 0.25 - 0.5 ng/mL          Stopping of antibiotics                                             encouraged.     Stopping of  antibiotics           encouraged.    ----------------------------     ------------------------------       PCT level decrease by              PCT >= 0.25 ng/mL       < 80% from peak PCT        AND PCT >= 0.5 ng/mL            Continuin g antibiotics                                              encouraged.       Continuing antibiotics            encouraged.    ----------------------------     ------------------------------     PCT level increase compared          PCT > 0.5 ng/mL         with peak PCT AND          PCT >= 0.5 ng/mL             Escalation of antibiotics                                          strongly encouraged.      Escalation of antibiotics        strongly encouraged.   CBC with Differential     Status: Abnormal   Collection Time: 10/23/14  4:42 AM  Result Value Ref Range   WBC 4.7 4.0 - 10.5 K/uL   RBC 4.51 4.22 - 5.81 MIL/uL   Hemoglobin 14.9 13.0 - 17.0 g/dL   HCT 42.6 39.0 - 52.0 %   MCV 94.5 78.0 - 100.0 fL   MCH 33.0 26.0 - 34.0 pg   MCHC 35.0 30.0 - 36.0 g/dL   RDW 15.2 11.5 - 15.5 %   Platelets 148 (L) 150 - 400 K/uL   Neutrophils Relative % 54 43 - 77 %   Neutro Abs 2.5 1.7 - 7.7 K/uL   Lymphocytes Relative 32 12 - 46 %   Lymphs Abs 1.5 0.7 - 4.0 K/uL   Monocytes Relative 10 3 - 12 %   Monocytes Absolute 0.5 0.1 - 1.0 K/uL   Eosinophils Relative 3 0 - 5 %   Eosinophils Absolute 0.2 0.0 - 0.7 K/uL   Basophils Relative 1 0 - 1 %   Basophils Absolute 0.0 0.0 - 0.1 K/uL  Phosphorus     Status: None   Collection Time: 10/23/14  4:42 AM  Result Value Ref Range   Phosphorus 4.5 2.3 - 4.6 mg/dL  Magnesium     Status: None   Collection Time: 10/23/14  4:42 AM  Result Value Ref Range   Magnesium 2.4 1.5 - 2.5 mg/dL  Basic metabolic panel     Status: Abnormal   Collection Time: 10/24/14  7:20 AM  Result Value Ref  Range   Sodium 142 137 - 147 mEq/L   Potassium 4.1 3.7 - 5.3 mEq/L   Chloride 106 96 - 112 mEq/L   CO2 23 19 - 32 mEq/L   Glucose, Bld 110 (H) 70 - 99 mg/dL   BUN 13 6 - 23  mg/dL   Creatinine, Ser 0.97 0.50 - 1.35 mg/dL   Calcium 9.2 8.4 - 10.5 mg/dL   GFR calc non Af Amer 88 (L) >90 mL/min   GFR calc Af Amer >90 >90 mL/min    Comment: (NOTE) The eGFR has been calculated using the CKD EPI equation. This calculation has not been validated in all clinical situations. eGFR's persistently <90 mL/min signify possible Chronic Kidney Disease.    Anion gap 13 5 - 15  CBC     Status: None   Collection Time: 10/24/14  7:20 AM  Result Value Ref Range   WBC 5.2 4.0 - 10.5 K/uL   RBC 4.30 4.22 - 5.81 MIL/uL   Hemoglobin 13.6 13.0 - 17.0 g/dL   HCT 40.9 39.0 - 52.0 %   MCV 95.1 78.0 - 100.0 fL   MCH 31.6 26.0 - 34.0 pg   MCHC 33.3 30.0 - 36.0 g/dL   RDW 15.1 11.5 - 15.5 %   Platelets 152 150 - 400 K/uL   Labs are reviewed.  Current Facility-Administered Medications  Medication Dose Route Frequency Provider Last Rate Last Dose  . amitriptyline (ELAVIL) tablet 350 mg  350 mg Oral QHS Leeanne Rio, MD   350 mg at 10/24/14 2218  . clonazePAM (KLONOPIN) tablet 2 mg  2 mg Oral QID [facility], DO   2 mg at 10/24/14 2218  . heparin injection 5,000 Units  5,000 Units Subcutaneous 3 times per day Rahul Dianna Rossetti, PA-C   5,000 Units at 10/25/14 0609  . levETIRAcetam (KEPPRA) tablet 500 mg  500 mg Oral BID Timmothy Euler, MD   500 mg at 10/24/14 2218  . metoprolol succinate (TOPROL-XL) 24 hr tablet 50 mg  50 mg Oral QHS [place] N [place], DO   50 mg at 10/24/14 2218  . pantoprazole (PROTONIX) EC tablet 40 mg  40 mg Oral Q1200 Brand Males, MD   40 mg at 10/24/14 1131  . phenytoin (DILANTIN) chewable tablet 200 mg  200 mg Oral QHS Leeanne Rio, MD   200 mg at 10/24/14 2218  . QUEtiapine (SEROQUEL) tablet 900 mg  900 mg Oral QHS Leeanne Rio, MD   900 mg at 10/24/14 2216   . ramelteon (ROZEREM) tablet 8 mg  8 mg Oral QHS Leeanne Rio, MD   8 mg at 10/24/14 2218  . senna (SENOKOT) tablet 8.6 mg  1 tablet Oral Daily [place] N [place], DO   8.6 mg at 10/24/14 1350  . zolpidem (AMBIEN) tablet 5 mg  5 mg Oral QHS PRN Timmothy Euler, MD   5 mg at 10/24/14 2218    Psychiatric Specialty Exam: Physical Exam as per history and physical  ROS none  Blood pressure 106/60, pulse 65, temperature 97.8 F (36.6 C), temperature source Oral, resp. rate 18, height 5' 11" (1.803 m), weight 86.229 kg (190 lb 1.6 oz), SpO2 96 %.Body mass index is 26.53 kg/(m^2).  General Appearance: Casual  Eye Contact::  Good  Speech:  Clear and Coherent  Volume:  Normal  Mood:  Euthymic  Affect:  Appropriate and Congruent  Thought Process:  Coherent and Goal Directed  Orientation:  Full (Time, Place, and Person)  Thought Content:  WDL  Suicidal Thoughts:  No  Homicidal Thoughts:  No  Memory:  Immediate;   Good Recent;   Good  Judgement:  Fair  Insight:  Good  Psychomotor Activity:  Decreased  Concentration:  Good  Recall:  Good  Fund of Knowledge:Good  Language: Good  Akathisia:  NA  Handed:  Right  AIMS (if indicated):     Assets:  Communication Skills Desire for Improvement Financial Resources/Insurance Housing Leisure Time Physical Health Resilience Social Support  Sleep:      Musculoskeletal: Strength & Muscle Tone: within normal limits Gait & Station: unable to stand Patient leans: N/A  Treatment Plan Summary: Daily contact with patient to assess and evaluate symptoms and progress in treatment Medication management  Refer to out patient psychiatric treatment at [name] and counseling when medically stable  ,JANARDHAHA R. 10/25/2014 9:37 AM
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
## 2014-10-26 ENCOUNTER — Encounter: Payer: Self-pay | Admitting: *Deleted

## 2014-10-26 NOTE — Discharge Summary (Signed)
[facility] Discharge Summary  Patient name: [name] Medical record number: [id number] Date of birth: [birth year] Age: 58 y.o. Gender: male Date of Admission: 10/20/2014  Date of Discharge: 10/25/14 Admitting Physician: Rush Farmer, MD  Primary Care Provider: Chrisandra Netters, MD Consultants: CCM  Indication for Hospitalization: Respiratory Failure  Discharge Diagnoses/Problem List:  Respiratory Failure Acute Encephalopathy Seizures Possible Drug Overdose Bradycardia Hypertension Hyperlipidemia  Disposition: Discharge home.  Discharge Condition: Stable  Brief Hospital Course:  [name] is a 58yo male who was found unconscious in his home with pill bottles nearby and was brought to the [facility] ED. He was bradycardic and required two doses of atropine and was intubated in the ED for airway protection. Calcium gluoconate was also given in the ED for cardiac stability. He was admitted to the ICU. He was sedated with propfol and fentanyl drips. Toprol and Zocor were held. CT of the head on 11/4 was negative. One dose of Rocephin was given in case of infectious etiology. Multiple seizures were witnessed in ICU. Propofol and fentanyl drip were discontinued on 11/5. [name] was extubated on 11/5. EEG on 11/5 showed normal pattern of sleep without evidence of epileptic disorder. [name] became more alert and denied any thoughts of suicidal ideation, however agrees to being unorganized with his medications.   [name] was transferred from the ICU to the general floor and to the service of the [facility] Medicine Teaching Service on 11/6. Home medications, including amitriptyline, remelteon, and klonopin, were restarted, however klonopin was initiated at a reduced dose and increased to home dose on 11/7. Metoprolol was restarted on 11/7, however he was noted to be hypotensive on 11/8 and dose was halved. Neurology was consulted and noted that [name]  has been having recurrent multiple breakthrough seizures with a past history of chronic seizure disorder. Seizure medication Dilantin 200mg  was continued and Keppra 500mg  BID was added to regimen. Psychiatry was consulted and recommended follow up with psychiatry as an outpatient. [name] was discharge on 11/9 following improvement in clinical status.  Issues for Follow Up:  - Recommended use of pill box. Prior to hospitalization, [name] would dump all of his pills in a drawer and just pick out the ones he needed each day. - Discharged on Metoprolol 50mg , which is half of his dose prior to hospitalization due to episodes of hypotension during hospitalization. May re-evaluate BP and determine if dose should be increased. - Follow up breakthrough seizures. Keppra 500mg  BID added to seizure medications. - Follow up with neurology and psychiatry (must call [facility] himself to set up appointment)    Significant Procedures: Intubation 11/4. Extubation 11/5.  Significant Labs and Imaging:   Recent Labs Lab 10/21/14 0243 10/23/14 0442 10/24/14 0720  WBC 11.2* 4.7 5.2  HGB 15.5 14.9 13.6  HCT 44.0 42.6 40.9  PLT 152 148* 152    Recent Labs Lab 10/20/14 2104  10/21/14 0243 10/23/14 0442 10/24/14 0720  NA  --   --  141 140 142  K  --   < > 4.3 4.0 4.1  CL  --   --  107 104 106  CO2  --   --  20 18* 23  GLUCOSE  --   --  82 106* 110*  BUN  --   --  14 8 13   CREATININE 0.86  --  0.86 0.87 0.97  CALCIUM  --   --  8.7 9.2 9.2  MG  --   --  2.1 2.4  --   PHOS  --   --  2.5 4.5  --   < > = values in this interval not displayed. ABG    Component Value Date/Time   PHART 7.514* 10/21/2014 0446   PCO2ART 25.1* 10/21/2014 0446   PO2ART 98.5 10/21/2014 0446   HCO3 20.1 10/21/2014 0446   TCO2 20.9 10/21/2014 0446   ACIDBASEDEF 2.5* 10/21/2014 0446   O2SAT 98.1 10/21/2014 0446   Urinalysis    Component Value Date/Time   COLORURINE YELLOW 10/20/2014 2354   APPEARANCEUR CLOUDY*  10/20/2014 2354   LABSPEC 1.023 10/20/2014 2354   PHURINE 6.0 10/20/2014 2354   GLUCOSEU NEGATIVE 10/20/2014 2354   HGBUR SMALL* 10/20/2014 2354   HGBUR negative 07/18/2010 1436   BILIRUBINUR NEGATIVE 10/20/2014 2354   KETONESUR NEGATIVE 10/20/2014 2354   PROTEINUR NEGATIVE 10/20/2014 2354   UROBILINOGEN 0.2 10/20/2014 2354   NITRITE NEGATIVE 10/20/2014 2354   LEUKOCYTESUR MODERATE* 10/20/2014 2354  - CK 190 - Lactic Acid 1.9 - Pro-calcitonin <0.10 - Phenytoin 09.3 - Salicylate <2 - Acetaminophen <15 - TSH 3.22 - Ethyl Alcohol <11 - UDS- + for Benzodiazepines  Ct Head Wo Contrast  10/20/2014   CLINICAL DATA:  Patient found unresponsive earlier in the day  EXAM: CT HEAD WITHOUT CONTRAST  TECHNIQUE: Contiguous axial images were obtained from the base of the skull through the vertex without intravenous contrast.  COMPARISON:  October 31, 2013  FINDINGS: There is age related volume loss. There is no mass, hemorrhage, extra-axial fluid collection, or midline shift. Gray-white compartments appear normal. No acute infarct evident. Bony calvarium appears intact. The mastoid air cells are clear.  IMPRESSION: Age related volume loss. No intracranial mass, hemorrhage, or focal gray -white compartment lesion/acute appearing infarct.   Electronically Signed   By: Lowella Grip M.D.   On: 10/20/2014 15:55   Dg Chest Port 1 View  10/21/2014   CLINICAL DATA:  Acute respiratory failure  EXAM: PORTABLE CHEST - 1 VIEW  COMPARISON:  Portable chest x-ray dated October 20, 2014  FINDINGS: The lung volumes remain low. The interstitial markings are less conspicuous today. The cardiac silhouette is normal in size. The pulmonary vascularity is not engorged. There is no pneumothorax or significant pleural effusion.  The endotracheal tube tip lies 1.7 cm above the crotch of the carina. The esophagogastric tube tip in proximal port project below the GE junction.  IMPRESSION: There is persistent bilateral  hypoinflation but the pulmonary interstitium has improved consistent with resolving interstitial edema. The endotracheal tube tip lies approximately 1.7 cm above the crotch of the carina and withdrawal by approximately 2 cm is recommended to avoid accidental mainstem bronchus intubation with patient movement.  Critical Value/emergent results were called by telephone at the time of interpretation to [facility] Ksor, RN, on 10/21/2014 at 8:02 am to who verbally acknowledged these results.   Electronically Signed   By: David  Martinique   On: 10/21/2014 08:06   Dg Chest Portable 1 View  10/20/2014   CLINICAL DATA:  Initial encounter for endotracheal tube placement.  EXAM: PORTABLE CHEST - 1 VIEW  COMPARISON:  10/31/2013.  FINDINGS: 1715 hrs. Endotracheal tube tip is 2.9 cm above the base of the chronic. Lung volumes are markedly low with vascular congestion and chronic atelectasis or scarring in right mid lung. No overt airspace pulmonary edema or focal lung consolidation. No pleural effusion. Telemetry leads overlie the chest. Surgical changes are noted in both shoulders.  IMPRESSION: Endotracheal tube tip is  approximately 3 cm above the base of the chronic.  Low lung volumes vascular congestion possible component of interstitial edema.  Stable chronic atelectasis or scarring in right mid.   Electronically Signed   By: Misty Stanley M.D.   On: 10/20/2014 17:26   Dg Abd Portable 1v  10/20/2014   CLINICAL DATA:  Initial encounter for NG tube placement.  EXAM: PORTABLE ABDOMEN - 1 VIEW  COMPARISON:  None.  FINDINGS: Supine view of the abdomen obtained portably at 1929 hrs shows the tip of the NG tube in the mid stomach. Bowel gas pattern is nonspecific. Atelectasis is seen in the lung bases.  IMPRESSION: NG tube tip is in the mid stomach.   Electronically Signed   By: Misty Stanley M.D.   On: 10/20/2014 20:27   Results/Tests Pending at Time of Discharge: None  Discharge Medications:    Medication List    TAKE these  medications        amitriptyline 150 MG tablet  Commonly known as:  ELAVIL  Take 350 mg by mouth at bedtime.     aspirin EC 81 MG tablet  Take 81 mg by mouth daily.     clonazePAM 2 MG tablet  Commonly known as:  KLONOPIN  Take 2 mg by mouth 4 (four) times daily.     levETIRAcetam 500 MG tablet  Commonly known as:  KEPPRA  Take 1 tablet (500 mg total) by mouth 2 (two) times daily.     metoprolol succinate 50 MG 24 hr tablet  Commonly known as:  TOPROL-XL  Take 1 tablet (50 mg total) by mouth at bedtime. Take with or immediately following a meal.     Multiple Vitamins tablet  Take 1 tablet by mouth daily.     omeprazole 20 MG capsule  Commonly known as:  PRILOSEC  Take 1 capsule (20 mg total) by mouth daily as needed. For heartburn or acid reflux.     phenytoin 50 MG tablet  Commonly known as:  DILANTIN  Chew 4 tablets (200 mg total) by mouth at bedtime.     polyethylene glycol packet  Commonly known as:  MIRALAX / GLYCOLAX  Take 17 g by mouth daily as needed. For constipation. Mix into 8 ounces of fluid & drink     QUEtiapine 300 MG tablet  Commonly known as:  SEROQUEL  Take 3 tablets (900 mg total) by mouth at bedtime. Takes 3 tabs QHS     ramelteon 8 MG tablet  Commonly known as:  ROZEREM  Take 1 tablet (8 mg total) by mouth at bedtime.     simvastatin 40 MG tablet  Commonly known as:  ZOCOR  Take 1 tablet (40 mg total) by mouth at bedtime.     testosterone cypionate 200 MG/ML injection  Commonly known as:  DEPOTESTOTERONE CYPIONATE  Inject 2 mLs (400 mg total) into the muscle every 14 (fourteen) days.       Discharge Instructions: Please refer to Patient Instructions section of EMR for full details.  Patient was counseled important signs and symptoms that should prompt return to medical care, changes in medications, dietary instructions, activity restrictions, and follow up appointments.   Follow-Up Appointments: Follow-up Information    Follow up with  McKeag, Marylynn Pearson, MD On 11/01/2014.   Specialty:  Family Medicine   Why:  @9 :00am for Hospital Follow-up   Contact information:   [address] [place] [address] [phone]       Follow up  with Lenor Coffin, MD On 11/02/2014.   Specialty:  Neurology   Why:  @8 :30am for Hospital Followup. Please arrive 20-30 minutes before appointment begins.   Contact information:   [address] [place] [address] [place], [place] 10/27/2014, 4:44 PM PGY-1, [place]

## 2014-11-01 ENCOUNTER — Inpatient Hospital Stay: Payer: Self-pay | Admitting: Family Medicine

## 2014-11-02 ENCOUNTER — Inpatient Hospital Stay: Payer: Self-pay | Admitting: Family Medicine

## 2014-11-02 ENCOUNTER — Institutional Professional Consult (permissible substitution): Payer: Self-pay | Admitting: Neurology

## 2014-11-03 ENCOUNTER — Encounter: Payer: Self-pay | Admitting: Neurology

## 2014-11-04 ENCOUNTER — Inpatient Hospital Stay: Payer: Self-pay | Admitting: Family Medicine

## 2014-11-09 ENCOUNTER — Encounter: Payer: Self-pay | Admitting: Neurology

## 2014-11-09 ENCOUNTER — Telehealth: Payer: Self-pay | Admitting: Neurology

## 2014-11-09 ENCOUNTER — Institutional Professional Consult (permissible substitution): Payer: Self-pay | Admitting: Neurology

## 2014-11-09 NOTE — Telephone Encounter (Signed)
This patient did not show for a consult appointment today. He canceled within several hours of the appointment.

## 2014-11-18 ENCOUNTER — Inpatient Hospital Stay (HOSPITAL_COMMUNITY)
Admission: EM | Admit: 2014-11-18 | Discharge: 2014-11-22 | DRG: 101 | Disposition: A | Discharge status: Home Health | Payer: Medicaid Other | Attending: Family Medicine | Admitting: Family Medicine

## 2014-11-18 ENCOUNTER — Emergency Department (HOSPITAL_COMMUNITY): Payer: Medicaid Other

## 2014-11-18 ENCOUNTER — Encounter (HOSPITAL_COMMUNITY): Payer: Self-pay | Admitting: *Deleted

## 2014-11-18 DIAGNOSIS — Z9079 Acquired absence of other genital organ(s): Secondary | ICD-10-CM | POA: Diagnosis not present

## 2014-11-18 DIAGNOSIS — Z9119 Patient's noncompliance with other medical treatment and regimen: Secondary | ICD-10-CM | POA: Diagnosis present

## 2014-11-18 DIAGNOSIS — R569 Unspecified convulsions: Secondary | ICD-10-CM | POA: Diagnosis present

## 2014-11-18 DIAGNOSIS — R410 Disorientation, unspecified: Secondary | ICD-10-CM | POA: Diagnosis not present

## 2014-11-18 DIAGNOSIS — F259 Schizoaffective disorder, unspecified: Secondary | ICD-10-CM | POA: Diagnosis present

## 2014-11-18 DIAGNOSIS — Z96612 Presence of left artificial shoulder joint: Secondary | ICD-10-CM | POA: Diagnosis present

## 2014-11-18 DIAGNOSIS — E785 Hyperlipidemia, unspecified: Secondary | ICD-10-CM | POA: Diagnosis present

## 2014-11-18 DIAGNOSIS — E291 Testicular hypofunction: Secondary | ICD-10-CM | POA: Diagnosis present

## 2014-11-18 DIAGNOSIS — R062 Wheezing: Secondary | ICD-10-CM | POA: Insufficient documentation

## 2014-11-18 DIAGNOSIS — Z22322 Carrier or suspected carrier of Methicillin resistant Staphylococcus aureus: Secondary | ICD-10-CM | POA: Diagnosis not present

## 2014-11-18 DIAGNOSIS — F322 Major depressive disorder, single episode, severe without psychotic features: Secondary | ICD-10-CM | POA: Diagnosis present

## 2014-11-18 DIAGNOSIS — K219 Gastro-esophageal reflux disease without esophagitis: Secondary | ICD-10-CM | POA: Diagnosis present

## 2014-11-18 DIAGNOSIS — I10 Essential (primary) hypertension: Secondary | ICD-10-CM | POA: Diagnosis present

## 2014-11-18 DIAGNOSIS — N390 Urinary tract infection, site not specified: Secondary | ICD-10-CM | POA: Diagnosis present

## 2014-11-18 DIAGNOSIS — Z87440 Personal history of urinary (tract) infections: Secondary | ICD-10-CM

## 2014-11-18 DIAGNOSIS — F419 Anxiety disorder, unspecified: Secondary | ICD-10-CM | POA: Diagnosis present

## 2014-11-18 DIAGNOSIS — G40909 Epilepsy, unspecified, not intractable, without status epilepticus: Secondary | ICD-10-CM | POA: Diagnosis present

## 2014-11-18 DIAGNOSIS — W19XXXA Unspecified fall, initial encounter: Secondary | ICD-10-CM

## 2014-11-18 LAB — COMPREHENSIVE METABOLIC PANEL
ALT: 38 U/L (ref 0–53)
AST: 35 U/L (ref 0–37)
Albumin: 3.8 g/dL (ref 3.5–5.2)
Alkaline Phosphatase: 97 U/L (ref 39–117)
Anion gap: 12 (ref 5–15)
BUN: 11 mg/dL (ref 6–23)
CO2: 26 mEq/L (ref 19–32)
Calcium: 9.2 mg/dL (ref 8.4–10.5)
Chloride: 100 mEq/L (ref 96–112)
Creatinine, Ser: 0.96 mg/dL (ref 0.50–1.35)
GFR calc Af Amer: >90 mL/min (ref >90)
GFR calc non Af Amer: 88 mL/min — ABNORMAL LOW (ref >90)
Glucose, Bld: 87 mg/dL (ref 70–99)
Potassium: 3.8 mEq/L (ref 3.7–5.3)
Sodium: 138 mEq/L (ref 137–147)
Total Bilirubin: <0.2 mg/dL — ABNORMAL LOW (ref 0.3–1.2)
Total Protein: 7 g/dL (ref 6.0–8.3)

## 2014-11-18 LAB — RAPID URINE DRUG SCREEN, HOSP PERFORMED
Amphetamines: NOT DETECTED
Barbiturates: NOT DETECTED
Benzodiazepines: POSITIVE — AB
Cocaine: NOT DETECTED
Opiates: NOT DETECTED
Tetrahydrocannabinol: NOT DETECTED

## 2014-11-18 LAB — CBC
HCT: 37.7 % — ABNORMAL LOW (ref 39.0–52.0)
Hemoglobin: 12.9 g/dL — ABNORMAL LOW (ref 13.0–17.0)
MCH: 32.7 pg (ref 26.0–34.0)
MCHC: 34.2 g/dL (ref 30.0–36.0)
MCV: 95.7 fL (ref 78.0–100.0)
Platelets: 204 10*3/uL (ref 150–400)
RBC: 3.94 MIL/uL — ABNORMAL LOW (ref 4.22–5.81)
RDW: 14.4 % (ref 11.5–15.5)
WBC: 5.8 10*3/uL (ref 4.0–10.5)

## 2014-11-18 LAB — PHENYTOIN LEVEL, TOTAL: Phenytoin Lvl: 18.2 ug/mL (ref 10.0–20.0)

## 2014-11-18 LAB — URINALYSIS, ROUTINE W REFLEX MICROSCOPIC
Bilirubin Urine: NEGATIVE
Glucose, UA: NEGATIVE mg/dL
Hgb urine dipstick: NEGATIVE
Ketones, ur: NEGATIVE mg/dL
Nitrite: POSITIVE — AB
Protein, ur: NEGATIVE mg/dL
Specific Gravity, Urine: 1.008 (ref 1.005–1.030)
Urobilinogen, UA: 0.2 mg/dL (ref 0.0–1.0)
pH: 6.5 (ref 5.0–8.0)

## 2014-11-18 LAB — ACETAMINOPHEN LEVEL: Acetaminophen (Tylenol), Serum: <15 ug/mL (ref 10–30)

## 2014-11-18 LAB — ETHANOL: Alcohol, Ethyl (B): <11 mg/dL (ref 0–11)

## 2014-11-18 LAB — URINE MICROSCOPIC-ADD ON

## 2014-11-18 LAB — SALICYLATE LEVEL: Salicylate Lvl: <2 mg/dL — ABNORMAL LOW (ref 2.8–20.0)

## 2014-11-18 MED ORDER — DEXTROSE 5 % IV SOLN
1.0000 g | Freq: Once | INTRAVENOUS | Status: AC
  Administered 2014-11-18: 1 g via INTRAVENOUS
  Filled 2014-11-18: qty 10

## 2014-11-18 MED ORDER — SODIUM CHLORIDE 0.9 % IV SOLN
200.0000 mg | INTRAVENOUS | Status: AC
  Administered 2014-11-18: 200 mg via INTRAVENOUS
  Filled 2014-11-18: qty 20

## 2014-11-18 MED ORDER — PHENYTOIN 50 MG PO CHEW
200.0000 mg | CHEWABLE_TABLET | ORAL | Status: AC
  Administered 2014-11-19: 200 mg via ORAL
  Filled 2014-11-18: qty 4

## 2014-11-18 NOTE — ED Notes (Signed)
Upon chart review, patient on Dilantin Patient missed last two Neuro appointments Dilantin level ordered

## 2014-11-18 NOTE — ED Notes (Addendum)
Patient called EMS for "wasn't sure how patient got where he was." Upon arrival to home, EMS states that patient was able to correctly state that he was in his home Patient DC from [facility] in November for OD on Klonopin--see Chart Review Patient states that he has a hx of depression, anxiety and schizoaffective DO Per EMS, patient with multiple deadbolts on door, home is wired with multiple security cameras, trash piled up in every room, kitchen floor covered with trash and spilled food Patient also states that he has memory loss, believed he was taken to [place] but then doesn't know how he got back to his house in [place] Patient noted to be pale--patient reportedly never leaves the house unless for medical reasons

## 2014-11-18 NOTE — ED Provider Notes (Signed)
CSN: [id number]     Arrival date & time 11/18/14  1720 History   First MD Initiated Contact with Patient 11/18/14 1728     Chief Complaint  Patient presents with  . Medical Clearance     (Consider location/radiation/quality/duration/timing/severity/associated sxs/prior Treatment) Patient is a 58 y.o. male presenting with seizures. The history is provided by the patient.  Seizures Seizure activity on arrival: no   Seizure type:  Unable to specify Initial focality:  Unable to specify Episode characteristics: unresponsiveness   Postictal symptoms: confusion and somnolence   Return to baseline: yes   Severity:  Mild Duration: unknown. Timing:  Intermittent Number of seizures this episode:  2 today he believes Progression:  Worsening Recent head injury: evidence of head injury on exam. He is unsure when he hit his head. PTA treatment:  None History of seizures: yes     Past Medical History  Diagnosis Date  . Seizures   . Hypertension   . GERD (gastroesophageal reflux disease)   . Dyslipidemia   . Schizoaffective disorder   . Depression   . Anxiety   . MRSA carrier    Past Surgical History  Procedure Laterality Date  . Self orchectomy    . Colonoscopy  2010  . Orif shoulder fracture    . Shoulder closed reduction Left 09/16/2013    Procedure: CLOSED REDUCTION SHOULDER;  Surgeon: Mauri Pole, MD;  Location: [facility];  Service: Orthopedics;  Laterality: Left;  . Shoulder hemi-arthroplasty Left 09/18/2013    Procedure: LEFT SHOULDER HEMI-ARTHROPLASTY;  Surgeon: Augustin Schooling, MD;  Location: [facility];  Service: Orthopedics;  Laterality: Left;   Family History  Problem Relation Age of Onset  . Stroke Father     Living at 86  . Stroke Mother     Stroke in late 41's. Died in her 75's   History  Substance Use Topics  . Smoking status: Never Smoker   . Smokeless tobacco: Never Used  . Alcohol Use: No    Review of Systems  Constitutional: Negative for fever.  HENT:  Negative for drooling and rhinorrhea.   Eyes: Negative for pain.  Respiratory: Negative for cough and shortness of breath.   Cardiovascular: Negative for chest pain and leg swelling.  Gastrointestinal: Negative for nausea, vomiting, abdominal pain and diarrhea.  Genitourinary: Negative for dysuria and hematuria.  Musculoskeletal: Negative for gait problem and neck pain.  Skin: Negative for color change.  Neurological: Positive for seizures. Negative for numbness and headaches.       Seizures  Hematological: Negative for adenopathy.  Psychiatric/Behavioral: Negative for behavioral problems.  All other systems reviewed and are negative.     Allergies  Codeine and Sulfa antibiotics  Home Medications   Prior to Admission medications   Medication Sig Start Date End Date Taking? Authorizing Provider  amitriptyline (ELAVIL) 150 MG tablet Take 350 mg by mouth at bedtime. Also gets 50mg  tablet to total 350mg  08/05/14  Yes Leeanne Rio, MD  clonazePAM (KLONOPIN) 2 MG tablet Take 2 mg by mouth 4 (four) times daily. 05/19/14  Yes Melony Overly, MD  levETIRAcetam (KEPPRA) 500 MG tablet Take 1 tablet (500 mg total) by mouth 2 (two) times daily. 10/24/14  Yes Bartlett N Rumley, DO  metoprolol succinate (TOPROL-XL) 50 MG 24 hr tablet Take 1 tablet (50 mg total) by mouth at bedtime. Take with or immediately following a meal. 10/24/14  Yes Tool N Rumley, DO  phenytoin (DILANTIN) 50 MG tablet Chew 4 tablets (  200 mg total) by mouth at bedtime. 05/19/14  Yes Melony Overly, MD  polyethylene glycol (MIRALAX / GLYCOLAX) packet Take 17 g by mouth daily as needed. For constipation. Mix into 8 ounces of fluid & drink 10/21/14  Yes Leeanne Rio, MD  QUEtiapine (SEROQUEL) 300 MG tablet Take 3 tablets (900 mg total) by mouth at bedtime. Takes 3 tabs QHS 05/19/14  Yes Melony Overly, MD  simvastatin (ZOCOR) 40 MG tablet Take 1 tablet (40 mg total) by mouth at bedtime. 05/19/14  Yes Melony Overly, MD  aspirin EC 81  MG tablet Take 81 mg by mouth daily.    Historical Provider, MD  Multiple Vitamins tablet Take 1 tablet by mouth daily.    Historical Provider, MD  omeprazole (PRILOSEC) 20 MG capsule Take 1 capsule (20 mg total) by mouth daily as needed. For heartburn or acid reflux. 05/19/14   Melony Overly, MD  ramelteon (ROZEREM) 8 MG tablet Take 1 tablet (8 mg total) by mouth at bedtime. 05/19/14   Melony Overly, MD  testosterone cypionate (DEPOTESTOTERONE CYPIONATE) 200 MG/ML injection Inject 2 mLs (400 mg total) into the muscle every 14 (fourteen) days. 05/17/14   Nena Polio, PA-C   There were no vitals taken for this visit. Physical Exam  Constitutional: He is oriented to person, place, and time. He appears well-developed and well-nourished.  HENT:  Right Ear: External ear normal.  Left Ear: External ear normal.  Nose: Nose normal.  Mouth/Throat: Oropharynx is clear and moist. No oropharyngeal exudate.  Old abrasion to the right parietal area.  Eyes: Conjunctivae and EOM are normal. Pupils are equal, round, and reactive to light.  Neck: Normal range of motion. Neck supple.  No vertebral tenderness to palpation.  Cardiovascular: Normal rate, regular rhythm, normal heart sounds and intact distal pulses.  Exam reveals no gallop and no friction rub.   No murmur heard. Pulmonary/Chest: Effort normal and breath sounds normal. No respiratory distress. He has no wheezes.  Abdominal: Soft. Bowel sounds are normal. He exhibits no distension. There is no tenderness. There is no rebound and no guarding.  Musculoskeletal: Normal range of motion. He exhibits no edema or tenderness.  Neurological: He is alert and oriented to person, place, and time.  alert, oriented x3 speech: normal in context and clarity memory: intact grossly cranial nerves II-XII: intact motor strength: full proximally and distally no involuntary movements or tremors sensation: intact to light touch diffusely  cerebellar: finger-to-nose and  heel-to-shin intact gait: normal forwards and backwards  Skin: Skin is warm and dry.  Psychiatric: He has a normal mood and affect. His behavior is normal.  Nursing note and vitals reviewed.   ED Course  Procedures (including critical care time) Labs Review Labs Reviewed  CBC - Abnormal; Notable for the following:    RBC 3.94 (*)    Hemoglobin 12.9 (*)    HCT 37.7 (*)    All other components within normal limits  COMPREHENSIVE METABOLIC PANEL - Abnormal; Notable for the following:    Total Bilirubin <0.2 (*)    GFR calc non Af Amer 88 (*)    All other components within normal limits  SALICYLATE LEVEL - Abnormal; Notable for the following:    Salicylate Lvl <6.4 (*)    All other components within normal limits  URINE RAPID DRUG SCREEN (HOSP PERFORMED) - Abnormal; Notable for the following:    Benzodiazepines POSITIVE (*)    All other components within normal limits  URINALYSIS, ROUTINE W REFLEX MICROSCOPIC - Abnormal; Notable for the following:    APPearance CLOUDY (*)    Nitrite POSITIVE (*)    Leukocytes, UA LARGE (*)    All other components within normal limits  URINE MICROSCOPIC-ADD ON - Abnormal; Notable for the following:    Squamous Epithelial / LPF FEW (*)    Bacteria, UA MANY (*)    All other components within normal limits  GRAM STAIN  URINE CULTURE  ACETAMINOPHEN LEVEL  ETHANOL  PHENYTOIN LEVEL, TOTAL  LEVETIRACETAM LEVEL    Imaging Review Ct Head Wo Contrast  11/18/2014   CLINICAL DATA:  Sudden onset seizures today.  EXAM: CT HEAD WITHOUT CONTRAST  TECHNIQUE: Contiguous axial images were obtained from the base of the skull through the vertex without intravenous contrast.  COMPARISON:  10/20/2014  FINDINGS: There is no evidence of intracranial hemorrhage, brain edema, or other signs of acute infarction. There is no evidence of intracranial mass lesion or mass effect. No abnormal extraaxial fluid collections are identified.  Mild generalized cerebral atrophy is  stable. No other intracranial abnormality identified. Ventricles are normal in size. No skull abnormality identified.  IMPRESSION: No acute intracranial findings.  Stable mild cerebral atrophy.   Electronically Signed   By: Earle Gell M.D.   On: 11/18/2014 19:10     EKG Interpretation None      MDM   Final diagnoses:  Falls  Seizure  UTI (lower urinary tract infection)    6:08 PM 58 y.o. male w hx of seizures, HTN, schizoaffective disorder who presents with seizures. Of note he was admitted last month for acute encephalopathy, respiratory failure. He was found to be having breakthrough seizures and neurology was consulted. Keppra was added to his current regimen of Dilantin. EEG on 11/5 showed normal pattern of sleep without evidence of epileptic disorder. He states that he has been having increased frequency of seizures over the last 5 days with multiple per day.  He believes that he has had 2 seizures today. He has an abrasion to the right parietal area which he states is from a seizure and fall at an unknown time.he is afebrile and vital signs are unremarkable here. He has a normal neurologic exam. We'll get screening labs and imaging.  I discussed the case with Dr. Doy Mince from neurology. She recommended discontinuing the Keppra. She recommended vimpat 200 mg IV now and then 50 mg twice a day.  Will admit to family medicine d/t ambulatory issues while walking longer distances which weren't initially noticed on my exam in the pt's room ambulating a short distance.   Pamella Pert, MD 11/19/14 [phone]

## 2014-11-18 NOTE — Progress Notes (Signed)
EDCM consulted to speak to patient as EDP was concerned patient is not taking his medications correctly at home and having increased confusion.  Patient listed as having Medicaid insurance living in [place].  Patient's pcp is Dr. Chrisandra Netters.  EDCM spoke to patient at bedside.  Patient is agreeable to home health services of visiting [facility] and [facility].  Patient reports he is able to perform ADL's on his own, however he does need assistance with meals.  Patient reports he is seeing a dietician who is helping him top lose weight.  Per chart review, social worker at [facility] assisted patient with connecting to meals on wheels program.  Patient reports he asks a friend to drive him to the store for groceries.  Patient does not drive.  EDCM provided patient with list of home health agencies in [facility], of which [facility] was chosen.  EDCM also provided patient with list of private duty nursing agencies.  EDCM discussed patient with EDRN.  As per EDRN, patient is unkempt and very unsteady when ambulating.  EDRN reports she would not trust patient to ambulate on his own.  EDRN also reports EMS stated that patient's house was full of trash and they were barely able to get to the patient.  EDCM placed consult to EDSW for further assistance.  Patient requesting walker on discharge.  [facility] discussed patient with EDP.  Patient to be admitted.  No further EDCM needs at this time.

## 2014-11-18 NOTE — Progress Notes (Signed)
CSW spoke with Pt at bedside. There was no family at bedside. Pt says he does not have a good support system. He informed CSW that his parents are deceased and his brother lives in [place]. Pt says the he has been diagnosed with depression and he sees a psychiatrist by the name of Dr.Reddy when he feels he needs to. The Pt says his house is a mess because he has lacked the motivation to clean due to depression. The Pt says he spends a lot of time in bed. Pt also informed CSW that he cannot drive again until he goes 6 months seizure free.  Pt informed CSW that he was BIB ambulance because he has a seizure. Pt stated that he was tired of having seizures. Therefore he called the ambulance to bring him to the ED. Pt says that he is able to bathe himself independently. Pt also states that he is able to feed himself, but says he only eats frozen dinners and eggs. Pt says he would really like to recieve help with medication management and also assistance with cleaning his home.  CSW will make a report to APS.  Tilda Burrow, [facility] CSW.11/18/2014 9:58 PM

## 2014-11-18 NOTE — ED Notes (Signed)
4N will call back for report.

## 2014-11-18 NOTE — ED Notes (Signed)
[facility] arrived to transport patient.  

## 2014-11-18 NOTE — ED Notes (Signed)
Patient transported to CT 

## 2014-11-18 NOTE — ED Notes (Signed)
Bed: WA07 Expected date:  Expected time:  Means of arrival:  Comments: EMS 

## 2014-11-18 NOTE — Progress Notes (Signed)
CSW completed APS report regarding Pt.  [name] [phone] ED CSW 11/18/2014 11:10 PM

## 2014-11-18 NOTE — H&P (Signed)
[facility] Admission History and Physical Service Pager: [phone]  Patient name: [name] Medical record number: [id number] Date of birth: Feb 06, 1956 Age: 58 y.o. Gender: male  Primary Care Provider: Chrisandra Netters, MD Consultants: None Code Status: Full  Chief Complaint: Seizures  Assessment and Plan: [name] is a 58 y.o. male presenting with seizures. PMH is significant for schizoaffective disorder, HLD, HTN, seizure disorder, and androgen deficiency (s/p self orchiectomy).  Confusion. Increased confusion possibly due to post-ictal effects vs side effects of keppra. Also potentially related to UTI. Patient is on home regimen of phenytoin 200mg  daily and keppra 500mg  bid. Questionable adherence to this regimen, though phenytoin level in target range. Head CT negative. UDS unremarkable. Neurology called while patient was in the ED and recommended discontinuing keppra and starting Vimpat. -Continue home phenytoin dose -Continue Vimpat -f/u keppra level -Seizure precaution -Neurology consult in AM -PT/OT recs  Poor Social Support. Patient lives alone with no social support and likely not taking medications at home properly. -Case management consult for home health needs  UTI. Endorses urinary frequency, no urgency, no dysuria - s/p 1 dose of rocephin in ED - Transition to oral keflex in AM  Seizure disorder. Is on phenytoin and keppra at home. Patient with self reported multiple seizures. - management per above problem  Schizoaffective Disorder. - Continue home amitriptyline, klonopin, seroquel, ramelteon  HTN. At goal - Continue home metoprolol  HLD. - Continue home statin  Androgen Deficiency: acquired s/p auto-castration many years ago. - Testosterone injection every 2 weeks - resume on discharge  FEN/GI: Regular diet, Saline lock IV Prophylaxis: SubQ Heparin  Disposition: Admitted for observation under attending Dr Andria Frames  pending above management.   History of Present Illness: [name] is a 58 y.o. male who presented to [facility] ED with concern for seizures and confusion.  Patient was recently admitted last month for acute encephalopathy and was found to be having recurrent multiple breakthrough seizures in the setting of a known seizure disorder. Patient was on Dilantin prior to admission and Keppra was added to his home medication regimen. Patient says that he knows he is having seizures because he experiences the same feeling as his seizures in the past. Of note, none of his seizures have been witnessed. No reported bowel or bladder incontinence. Patient reports that he knows when he is about to have a seizure and can catch himself falling down.  Patient reports that over the past several days he has felt more confused and feels like he has been having multiple seizures per day (up to 3 or 4). Says that he sometimes forgets what he is saying mid-sentence.  Of note, patient lives by himself, and reports being unorganized with his home medications. Patient is able to perform ADLs but has a history of being poorly compliant with his medications, per chart review.   In the ED, labs were remarkable for UTI. Head imaging was normal. Neurology was called in the ED and recommended discontinuing Keppra and starting Vimpat with outpatient follow up. However given the patient's relatively poor social environment (as outlined above) and poor compliance with medications it was decided that he be admitted. Patient was given Vimpat 200mg  IV and home phenytoin in addition to ceftriaxone for suspected UTI.  Review Of Systems: Per HPI, otherwise 12 point review of systems was performed and was unremarkable.  Patient Active Problem List   Diagnosis Date Noted  . Seizure 11/18/2014  . Schizoaffective disorder,  unspecified type   . Essential hypertension   . Overdose of benzodiazepine 10/22/2014  . Overdose of  beta-adrenergic antagonist drug 10/22/2014  . Acute respiratory failure, unspecified whether with hypoxia or hypercapnia   . Acute respiratory failure 10/20/2014  . Bradycardia, sinus   . Altered mental status   . Overdose   . Nocturnal enuresis 03/23/2014  . Benzodiazepine dependence, continuous 01/27/2014  . Opiate dependence 01/22/2014  . Severe major depression 01/21/2014  . Frozen shoulder syndrome 09/20/2013  . Somnolence 09/20/2013  . Schizoaffective disorder 09/20/2013  . Malaise and fatigue 04/21/2013  . Presbyopia 04/25/2012  . Androgen deficiency 09/07/2011  . Constipation, chronic 09/07/2011  . Other specified disorders of liver 12/30/2008  . Overweight 09/08/2008  . HYPERLIPIDEMIA 01/28/2008  . HYPERTENSION 01/28/2008  . Seizure disorder 01/28/2008  . OSTEOPENIA 07/01/2007   Past Medical History: Past Medical History  Diagnosis Date  . Seizures   . Hypertension   . GERD (gastroesophageal reflux disease)   . Dyslipidemia   . Schizoaffective disorder   . Depression   . Anxiety   . MRSA carrier    Past Surgical History: Past Surgical History  Procedure Laterality Date  . Self orchectomy    . Colonoscopy  2010  . Orif shoulder fracture    . Shoulder closed reduction Left 09/16/2013    Procedure: CLOSED REDUCTION SHOULDER;  Surgeon: Mauri Pole, MD;  Location: [facility];  Service: Orthopedics;  Laterality: Left;  . Shoulder hemi-arthroplasty Left 09/18/2013    Procedure: LEFT SHOULDER HEMI-ARTHROPLASTY;  Surgeon: Augustin Schooling, MD;  Location: [facility];  Service: Orthopedics;  Laterality: Left;   Social History: History  Substance Use Topics  . Smoking status: Never Smoker   . Smokeless tobacco: Never Used  . Alcohol Use: No   Additional social history: Please see HPI Please also refer to relevant sections of EMR.  Family History: Family History  Problem Relation Age of Onset  . Stroke Father     Living at 40  . Stroke Mother     Stroke in late 70's.  Died in her 74's   Allergies and Medications: Allergies  Allergen Reactions  . Codeine Nausea And Vomiting  . Sulfa Antibiotics Nausea And Vomiting   No current facility-administered medications on file prior to encounter.   Current Outpatient Prescriptions on File Prior to Encounter  Medication Sig Dispense Refill  . amitriptyline (ELAVIL) 150 MG tablet Take 150 mg by mouth at bedtime.     . clonazePAM (KLONOPIN) 2 MG tablet Take 2 mg by mouth 4 (four) times daily.    Marland Kitchen levETIRAcetam (KEPPRA) 500 MG tablet Take 1 tablet (500 mg total) by mouth 2 (two) times daily. 60 tablet 0  . metoprolol succinate (TOPROL-XL) 50 MG 24 hr tablet Take 1 tablet (50 mg total) by mouth at bedtime. Take with or immediately following a meal. 30 tablet 0  . phenytoin (DILANTIN) 50 MG tablet Chew 4 tablets (200 mg total) by mouth at bedtime. 120 tablet 6  . polyethylene glycol (MIRALAX / GLYCOLAX) packet Take 17 g by mouth daily as needed. For constipation. Mix into 8 ounces of fluid & drink 30 each 6  . QUEtiapine (SEROQUEL) 300 MG tablet Take 3 tablets (900 mg total) by mouth at bedtime. Takes 3 tabs QHS 90 tablet 6  . simvastatin (ZOCOR) 40 MG tablet Take 1 tablet (40 mg total) by mouth at bedtime. 30 tablet 6  . aspirin EC 81 MG tablet Take 81 mg by  mouth daily.    . Multiple Vitamins tablet Take 1 tablet by mouth daily.    Marland Kitchen omeprazole (PRILOSEC) 20 MG capsule Take 1 capsule (20 mg total) by mouth daily as needed. For heartburn or acid reflux. 30 capsule 6  . ramelteon (ROZEREM) 8 MG tablet Take 1 tablet (8 mg total) by mouth at bedtime. 30 tablet 2  . testosterone cypionate (DEPOTESTOTERONE CYPIONATE) 200 MG/ML injection Inject 2 mLs (400 mg total) into the muscle every 14 (fourteen) days. 10 mL 0    Objective: BP 142/83 mmHg  Pulse 90  Temp(Src) 98.7 F (37.1 C) (Oral)  Resp 28  SpO2 96% Exam: General: Alert, calm, in NAD lying in hospital bed HEENT: PERRL, MMM, EOMI Cardiovascular: RRR, no  murmurs appreciated Respiratory: NWOB, CTAB, no wheezes Abdomen: +BS, soft, NT, ND Extremities: No cyanosis, No edema, several scars noted on right forearm Skin: No rashes Neuro: Alert and oriented x3. CN2-12 intact, Strength 5/5 in UE and LE. Sensation grossly intact with no gross deficits. Patellar reflexes 2+ bilaterally Psych: Flat affect, slightly tangential, no suicidal or homicidal ideation  Labs and Imaging: CBC BMET   Recent Labs Lab 11/18/14 1810  WBC 5.8  HGB 12.9*  HCT 37.7*  PLT 204    Recent Labs Lab 11/18/14 1810  NA 138  K 3.8  CL 100  CO2 26  BUN 11  CREATININE 0.96  GLUCOSE 87  CALCIUM 9.2     Urinalysis    Component Value Date/Time   COLORURINE YELLOW 11/18/2014 1831   APPEARANCEUR CLOUDY* 11/18/2014 1831   LABSPEC 1.008 11/18/2014 1831   PHURINE 6.5 11/18/2014 1831   GLUCOSEU NEGATIVE 11/18/2014 1831   HGBUR NEGATIVE 11/18/2014 1831   HGBUR negative 07/18/2010 1436   BILIRUBINUR NEGATIVE 11/18/2014 1831   KETONESUR NEGATIVE 11/18/2014 1831   PROTEINUR NEGATIVE 11/18/2014 1831   UROBILINOGEN 0.2 11/18/2014 1831   NITRITE POSITIVE* 11/18/2014 1831   LEUKOCYTESUR LARGE* 11/18/2014 1831   Phenytoin 18.2 (Therapeutic) Salicylate, APAP, EtOH levels negative UDS: Positive for benzos  Ct Head Wo Contrast  11/18/2014   CLINICAL DATA:  Sudden onset seizures today.  EXAM: CT HEAD WITHOUT CONTRAST  TECHNIQUE: Contiguous axial images were obtained from the base of the skull through the vertex without intravenous contrast.  COMPARISON:  10/20/2014  FINDINGS: There is no evidence of intracranial hemorrhage, brain edema, or other signs of acute infarction. There is no evidence of intracranial mass lesion or mass effect. No abnormal extraaxial fluid collections are identified.  Mild generalized cerebral atrophy is stable. No other intracranial abnormality identified. Ventricles are normal in size. No skull abnormality identified.  IMPRESSION: No acute  intracranial findings.  Stable mild cerebral atrophy.   Electronically Signed   By: Earle Gell M.D.   On: 11/18/2014 19:10    Dimas Chyle, MD 11/18/2014, 10:21 PM PGY-1, [facility] pager: [phone], text pages welcome  I have seen and examined the patient. I have read and agree with the above note. My changes are noted in blue.  Cordelia Poche, MD PGY-2, [facility] 11/19/2014, 6:04 AM

## 2014-11-18 NOTE — ED Notes (Signed)
Report given to [facility]. 

## 2014-11-19 ENCOUNTER — Encounter (HOSPITAL_COMMUNITY): Payer: Self-pay | Admitting: *Deleted

## 2014-11-19 DIAGNOSIS — I10 Essential (primary) hypertension: Secondary | ICD-10-CM

## 2014-11-19 DIAGNOSIS — W19XXXA Unspecified fall, initial encounter: Secondary | ICD-10-CM

## 2014-11-19 DIAGNOSIS — E785 Hyperlipidemia, unspecified: Secondary | ICD-10-CM | POA: Insufficient documentation

## 2014-11-19 DIAGNOSIS — F259 Schizoaffective disorder, unspecified: Secondary | ICD-10-CM

## 2014-11-19 DIAGNOSIS — N390 Urinary tract infection, site not specified: Secondary | ICD-10-CM | POA: Insufficient documentation

## 2014-11-19 DIAGNOSIS — G40909 Epilepsy, unspecified, not intractable, without status epilepticus: Principal | ICD-10-CM

## 2014-11-19 LAB — GRAM STAIN

## 2014-11-19 MED ORDER — POLYETHYLENE GLYCOL 3350 17 G PO PACK: 17.0000 g | PACK | Freq: Every day | ORAL | Status: DC | PRN

## 2014-11-19 MED ORDER — ASPIRIN EC 81 MG PO TBEC
81.0000 mg | DELAYED_RELEASE_TABLET | Freq: Every day | ORAL | Status: DC
  Administered 2014-11-19 – 2014-11-22 (×4): 81 mg via ORAL
  Filled 2014-11-19 (×2): qty 1

## 2014-11-19 MED ORDER — AMITRIPTYLINE HCL 25 MG PO TABS
150.0000 mg | ORAL_TABLET | Freq: Every day | ORAL | Status: DC
  Administered 2014-11-19 – 2014-11-21 (×4): 150 mg via ORAL
  Filled 2014-11-19 (×2): qty 6

## 2014-11-19 MED ORDER — LACOSAMIDE 50 MG PO TABS
50.0000 mg | ORAL_TABLET | Freq: Two times a day (BID) | ORAL | Status: DC
  Administered 2014-11-19 – 2014-11-22 (×8): 50 mg via ORAL
  Filled 2014-11-19 (×5): qty 1

## 2014-11-19 MED ORDER — METOPROLOL SUCCINATE ER 25 MG PO TB24
50.0000 mg | ORAL_TABLET | Freq: Every day | ORAL | Status: DC
  Administered 2014-11-19 – 2014-11-22 (×4): 50 mg via ORAL
  Filled 2014-11-19 (×3): qty 2

## 2014-11-19 MED ORDER — SODIUM CHLORIDE 0.9 % IV SOLN
INTRAVENOUS | Status: DC
  Administered 2014-11-19 – 2014-11-21 (×6): via INTRAVENOUS

## 2014-11-19 MED ORDER — HEPARIN SODIUM (PORCINE) 5000 UNIT/ML IJ SOLN
5000.0000 [IU] | Freq: Three times a day (TID) | INTRAMUSCULAR | Status: DC
Start: 2014-11-19 — End: 2014-11-22
  Administered 2014-11-19 – 2014-11-22 (×11): 5000 [IU] via SUBCUTANEOUS
  Filled 2014-11-19 (×7): qty 1

## 2014-11-19 MED ORDER — SIMVASTATIN 40 MG PO TABS
40.0000 mg | ORAL_TABLET | Freq: Every day | ORAL | Status: DC
  Administered 2014-11-19 – 2014-11-22 (×5): 40 mg via ORAL
  Filled 2014-11-19 (×3): qty 1

## 2014-11-19 MED ORDER — QUETIAPINE FUMARATE 50 MG PO TABS
900.0000 mg | ORAL_TABLET | Freq: Every day | ORAL | Status: DC
  Administered 2014-11-19 – 2014-11-21 (×4): 900 mg via ORAL
  Filled 2014-11-19: qty 4
  Filled 2014-11-19: qty 2
  Filled 2014-11-19: qty 4
  Filled 2014-11-19: qty 2

## 2014-11-19 MED ORDER — CEPHALEXIN 500 MG PO CAPS
500.0000 mg | ORAL_CAPSULE | Freq: Two times a day (BID) | ORAL | Status: DC
  Administered 2014-11-19 – 2014-11-22 (×8): 500 mg via ORAL
  Filled 2014-11-19 (×5): qty 1

## 2014-11-19 MED ORDER — CLONAZEPAM 1 MG PO TABS
2.0000 mg | ORAL_TABLET | Freq: Four times a day (QID) | ORAL | Status: DC
  Administered 2014-11-19 – 2014-11-22 (×17): 2 mg via ORAL
  Filled 2014-11-19 (×11): qty 2

## 2014-11-19 MED ORDER — INFLUENZA VAC SPLIT QUAD 0.5 ML IM SUSY: 0.5000 mL | PREFILLED_SYRINGE | INTRAMUSCULAR | Status: DC

## 2014-11-19 MED ORDER — RAMELTEON 8 MG PO TABS
8.0000 mg | ORAL_TABLET | Freq: Every day | ORAL | Status: DC
  Administered 2014-11-19 – 2014-11-21 (×4): 8 mg via ORAL
  Filled 2014-11-19 (×5): qty 1

## 2014-11-19 MED ORDER — PHENYTOIN 50 MG PO CHEW
200.0000 mg | CHEWABLE_TABLET | Freq: Every day | ORAL | Status: DC
  Administered 2014-11-20 – 2014-11-22 (×3): 200 mg via ORAL
  Filled 2014-11-19 (×3): qty 4

## 2014-11-19 MED ORDER — PHENYTOIN 50 MG PO CHEW
200.0000 mg | CHEWABLE_TABLET | Freq: Every day | ORAL | Status: DC
  Filled 2014-11-19 (×2): qty 4

## 2014-11-19 MED ORDER — SODIUM CHLORIDE 0.9 % IJ SOLN
3.0000 mL | Freq: Two times a day (BID) | INTRAMUSCULAR | Status: DC
  Administered 2014-11-19 – 2014-11-22 (×4): 3 mL via INTRAVENOUS

## 2014-11-19 NOTE — Consult Note (Signed)
Consult Reason for Consult:breakthrough seizure and confusion Referring Physician: Dr Andria Frames  CC: seizures  HPI: [name] is an 58 y.o. male with concern for seizures and confusion.  Patient was recently admitted last month for acute encephalopathy and was found to be having recurrent multiple breakthrough seizures in the setting of a known seizure disorder. Patient was on Dilantin prior to admission and Keppra was added to his home medication regimen. Patient says that he knows he is having seizures because he experiences the same feeling as his seizures in the past. Patient reports that over the past several days he has felt more confused and feels like he has been having multiple seizures per day (up to 3 or 4). Says that he sometimes forgets what he is saying mid-sentence.  Of note, patient lives by himself, and reports being unorganized with his home medications. Patient is able to perform ADLs but has a history of being poorly compliant with his medications, per chart review. In the ED, labs were remarkable for UTI. Head imaging was normal. Neurology was called in the ED and recommended discontinuing Keppra and starting Vimpat with outpatient follow up. However given the patient's relatively poor social environment (as outlined above) and poor compliance with medications it was decided that he be admitted.  Corrected dilantin level is 21.2.    Past Medical History  Diagnosis Date  . Seizures   . Hypertension   . GERD (gastroesophageal reflux disease)   . Dyslipidemia   . Schizoaffective disorder   . Depression   . Anxiety   . MRSA carrier     Past Surgical History  Procedure Laterality Date  . Self orchectomy    . Colonoscopy  2010  . Orif shoulder fracture    . Shoulder closed reduction Left 09/16/2013    Procedure: CLOSED REDUCTION SHOULDER;  Surgeon: Mauri Pole, MD;  Location: [facility];  Service: Orthopedics;  Laterality: Left;  . Shoulder hemi-arthroplasty Left  09/18/2013    Procedure: LEFT SHOULDER HEMI-ARTHROPLASTY;  Surgeon: Augustin Schooling, MD;  Location: [facility];  Service: Orthopedics;  Laterality: Left;    Family History  Problem Relation Age of Onset  . Stroke Father     Living at 35  . Stroke Mother     Stroke in late 5's. Died in her 49's    Social History:  reports that he has never smoked. He has never used smokeless tobacco. He reports that he does not drink alcohol or use illicit drugs.  Allergies  Allergen Reactions  . Codeine Nausea And Vomiting  . Sulfa Antibiotics Nausea And Vomiting    Medications:  Scheduled: . amitriptyline  150 mg Oral QHS  . aspirin EC  81 mg Oral Daily  . cephALEXin  500 mg Oral Q12H  . clonazePAM  2 mg Oral QID  . heparin  5,000 Units Subcutaneous 3 times per day  . lacosamide  50 mg Oral BID  . metoprolol succinate  50 mg Oral QHS  . phenytoin  200 mg Oral QHS  . QUEtiapine  900 mg Oral QHS  . ramelteon  8 mg Oral QHS  . simvastatin  40 mg Oral QHS  . sodium chloride  3 mL Intravenous Q12H    Head CT imaging reviewed and is unremarkable.   ROS: Out of a complete 14 system review, the patient complains of only the following symptoms, and all other reviewed systems are negative. + fatigue, dizziness  Physical Examination: Filed Vitals:   11/19/14  1029  BP: 79/50  Pulse: 83  Temp: 98.4 F (36.9 C)  Resp: 19   Physical Exam  Constitutional: He appears well-developed and well-nourished.  Psych: Affect appropriate to situation Eyes: No scleral injection HENT: No OP obstrucion Head: Normocephalic.  Cardiovascular: Normal rate and regular rhythm.  Respiratory: Effort normal and breath sounds normal.  GI: Soft. Bowel sounds are normal. No distension. There is no tenderness.  Skin: WDI  Neurologic Examination Mental Status: Alert, oriented, thought content appropriate.  Speech fluent without evidence of aphasia.  Able to follow 3 step commands without difficulty. Cranial  Nerves: II: funduscopic exam wnl bilaterally, visual fields grossly normal, pupils equal, round, reactive to light and accommodation III,IV, VI: ptosis not present, extra-ocular motions intact bilaterally V,VII: smile symmetric, facial light touch sensation normal bilaterally VIII: hearing normal bilaterally IX,X: gag reflex present XI: trapezius strength/neck flexion strength normal bilaterally XII: tongue strength normal  Motor: Right : Upper extremity    Left:     Upper extremity 5/5 deltoid       5/5 deltoid 5/5 biceps      5/5 biceps  5/5 triceps      5/5 triceps 5/5wrist flexion     5/5 wrist flexion 5/5 wrist extension     5/5 wrist extension 5/5 hand grip      5/5 hand grip  Lower extremity     Lower extremity 5/5 hip flexor      5/5 hip flexor 5/5 hip adductors     5/5 hip adductors 5/5 hip abductors     5/5 hip abductors 5/5 quadricep      5/5 quadriceps  5/5 hamstrings     5/5 hamstrings 5/5 plantar flexion       5/5 plantar flexion 5/5 plantar extension     5/5 plantar extension Tone and bulk:normal tone throughout; no atrophy noted Sensory: Pinprick and light touch intact throughout, bilaterally Deep Tendon Reflexes: 2+ and symmetric throughout Plantars: Right: downgoing   Left: downgoing Cerebellar: normal finger-to-nose, normal rapid alternating movements and normal heel-to-shin test Gait: normal gait and station  Laboratory Studies:   Basic Metabolic Panel:  Recent Labs Lab 11/18/14 1810  NA 138  K 3.8  CL 100  CO2 26  GLUCOSE 87  BUN 11  CREATININE 0.96  CALCIUM 9.2    Liver Function Tests:  Recent Labs Lab 11/18/14 1810  AST 35  ALT 38  ALKPHOS 97  BILITOT <0.2*  PROT 7.0  ALBUMIN 3.8   No results for input(s): LIPASE, AMYLASE in the last 168 hours. No results for input(s): AMMONIA in the last 168 hours.  CBC:  Recent Labs Lab 11/18/14 1810  WBC 5.8  HGB 12.9*  HCT 37.7*  MCV 95.7  PLT 204    Cardiac Enzymes: No results  for input(s): CKTOTAL, CKMB, CKMBINDEX, TROPONINI in the last 168 hours.  BNP: Invalid input(s): POCBNP  CBG: No results for input(s): GLUCAP in the last 168 hours.  Microbiology: Results for orders placed or performed during the hospital encounter of 10/20/14  MRSA PCR Screening     Status: None   Collection Time: 10/20/14  8:06 PM  Result Value Ref Range Status   MRSA by PCR NEGATIVE NEGATIVE Final    Comment:        The GeneXpert MRSA Assay (FDA approved for NASAL specimens only), is one component of a comprehensive MRSA colonization surveillance program. It is not intended to diagnose MRSA infection nor to guide or monitor treatment for  MRSA infections.   Culture, Urine     Status: None   Collection Time: 10/21/14  4:59 AM  Result Value Ref Range Status   Specimen Description URINE, CLEAN CATCH  Final   Special Requests NONE  Final   Culture  Setup Time   Final    10/21/2014 11:02 Performed at [facility] Performed at [facility]   Final   Culture NO GROWTH Performed at [facility]   Final   Report Status 10/22/2014 FINAL  Final    Coagulation Studies: No results for input(s): LABPROT, INR in the last 72 hours.  Urinalysis:  Recent Labs Lab 11/18/14 1831  COLORURINE YELLOW  LABSPEC 1.008  PHURINE 6.5  GLUCOSEU NEGATIVE  HGBUR NEGATIVE  BILIRUBINUR NEGATIVE  KETONESUR NEGATIVE  PROTEINUR NEGATIVE  UROBILINOGEN 0.2  NITRITE POSITIVE*  LEUKOCYTESUR LARGE*    Lipid Panel:     Component Value Date/Time   CHOL 149 12/21/2013 1706   TRIG 195* 10/20/2014 2104   HDL 53 12/21/2013 1706   CHOLHDL 2.8 12/21/2013 1706   VLDL 32 12/21/2013 1706   LDLCALC 64 12/21/2013 1706    HgbA1C:  Lab Results  Component Value Date   HGBA1C 5.3 05/19/2014    Urine Drug Screen:     Component Value Date/Time   LABOPIA NONE DETECTED 11/18/2014 1831   COCAINSCRNUR NONE DETECTED 11/18/2014 1831   LABBENZ  POSITIVE* 11/18/2014 1831   AMPHETMU NONE DETECTED 11/18/2014 1831   THCU NONE DETECTED 11/18/2014 1831   LABBARB NONE DETECTED 11/18/2014 1831    Alcohol Level:  Recent Labs Lab 11/18/14 1810  ETH <11     Imaging: Ct Head Wo Contrast  11/18/2014   CLINICAL DATA:  Sudden onset seizures today.  EXAM: CT HEAD WITHOUT CONTRAST  TECHNIQUE: Contiguous axial images were obtained from the base of the skull through the vertex without intravenous contrast.  COMPARISON:  10/20/2014  FINDINGS: There is no evidence of intracranial hemorrhage, brain edema, or other signs of acute infarction. There is no evidence of intracranial mass lesion or mass effect. No abnormal extraaxial fluid collections are identified.  Mild generalized cerebral atrophy is stable. No other intracranial abnormality identified. Ventricles are normal in size. No skull abnormality identified.  IMPRESSION: No acute intracranial findings.  Stable mild cerebral atrophy.   Electronically Signed   By: Earle Gell M.D.   On: 11/18/2014 19:10     Assessment/Plan:  58y/o male with hx of seizures, schizoaffective disorder, HLD, HTN presenting with break through seizures. He is also currently suffering from a UTI which may have lowered his seizure threshold. On keppra and dilantin prior to admission. Currently on Vimpat and dilantin, current corrected dilantin level of 21.2. Based on his clinical description unclear if these events represent true epileptic events.   -continue Vimpat 50mg  BID -hold PM dose of dilantin due to elevated level and then restart on 12/05 -seizure precautions -no further inpatient neurological workup indicated at this time -needs outpatient neurology follow up. Due to frequent nature of events would benefit from ambulatory EEG to determine if spells are epileptic or non-epileptic in nature.  -will follow up as needed during hospital admission  Jim Like, DO [name] [phone]  If 7pm-  7am, please page neurology on call as listed in [place]. 11/19/2014, 11:22 AM

## 2014-11-19 NOTE — Evaluation (Signed)
Physical Therapy Evaluation Patient Details Name: [name] MRN: [id number] DOB: 10-19-1956 Today's Date: 11/19/2014   History of Present Illness  58 yo male arrived via EMS from home.pt presents with confusion, UTI (+) and questionable seizures. PMH: depression, anxiety, schizoaffective DO, hx memory loss, HTN, acute respiratory failure, opiate dependence, frozen syndrome,GERD  Clinical Impression  Pt was seen for evaluation of his mobility status and to discuss his options at DC.  He is alone and fairly much struggling with med administration per chart, not to metion his limited mobilty now.  He would benefit from SNF stay and discussed with him.  He is cognitively limited right now and may need some time to process this fully.    Follow Up Recommendations SNF;Supervision/Assistance - 24 hour    Equipment Recommendations  Rolling walker with 5" wheels    Recommendations for Other Services       Precautions / Restrictions Precautions Precautions: Fall;Other (comment) (telemetry, seizures) Precaution Comments: seizures Restrictions Weight Bearing Restrictions: No      Mobility  Bed Mobility Overal bed mobility: Needs Assistance Bed Mobility: Rolling;Supine to Sit Rolling: Min assist   Supine to sit: Mod assist     General bed mobility comments: reminders for pt to go through the sequence of safe mobility  Transfers Overall transfer level: Needs assistance Equipment used: Rolling walker (2 wheeled);1 person hand held assist Transfers: Sit to/from [facility] Sit to Stand: Min assist;Mod assist Stand pivot transfers: Min assist;Mod assist       General transfer comment: Assisted to pwer up and mod converts to min for higher surfaces for both transfers  Ambulation/Gait Ambulation/Gait assistance: Min assist Ambulation Distance (Feet): 30 Feet Assistive device: Rolling walker (2 wheeled);1 person hand held assist Gait Pattern/deviations:  Step-through pattern;Decreased step length - left;Decreased step length - right;Decreased dorsiflexion - right;Decreased dorsiflexion - left;Narrow base of support;Trunk flexed;Drifts right/left Gait velocity: reduced Gait velocity interpretation: Below normal speed for age/gender General Gait Details: shuffling a bit with slow response to PT requiest to change direction or correct pattern  Stairs            Wheelchair Mobility    Modified Rankin (Stroke Patients Only)       Balance Overall balance assessment: Needs assistance Sitting-balance support: Feet supported Sitting balance-Leahy Scale: Fair   Postural control: Posterior lean Standing balance support: Bilateral upper extremity supported Standing balance-Leahy Scale: Poor Standing balance comment: requires cues to correct initial standing and to make pt aware of using walker with safety                             Pertinent Vitals/Pain Pain Assessment: No/denies pain    Home Living Family/patient expects to be discharged to:: Private residence Living Arrangements: Alone Available Help at Discharge: Other (Comment) (none) Type of Home: Apartment Home Access: Level entry     Home Layout: One level Home Equipment: None Additional Comments: pt confused and may be questionable historian    Prior Function Level of Independence: Independent         Comments: Pmhx seizures and cannot drive     Hand Dominance        Extremity/Trunk Assessment   Upper Extremity Assessment: Generalized weakness           Lower Extremity Assessment: Generalized weakness      Cervical / Trunk Assessment: Normal  Communication   Communication: No difficulties;Other (comment) (confused)  Cognition Arousal/Alertness:  Awake/alert Behavior During Therapy: Flat affect Overall Cognitive Status: Impaired/Different from baseline Area of Impairment: Memory;Safety/judgement;Awareness;Problem solving;Following  commands     Memory: Decreased recall of precautions;Decreased short-term memory Following Commands: Follows one step commands inconsistently Safety/Judgement: Decreased awareness of safety;Decreased awareness of deficits Awareness: Emergent;Anticipatory Problem Solving: Slow processing;Decreased initiation;Difficulty sequencing;Requires verbal cues;Requires tactile cues      General Comments General comments (skin integrity, edema, etc.): Pt is somewhat unreliaable a historian and describes some unwitnessed seizures, not aware fully of why he is in hospital.  Not able to problem solve with PT about managing at home safely    Exercises General Exercises - Lower Extremity Ankle Circles/Pumps: AROM;Both;5 reps Quad Sets: Strengthening;Both;5 reps Long Arc Quad: Strengthening;Both;5 reps Hip ABduction/ADduction: Strengthening;Both;5 reps      Assessment/Plan    PT Assessment Patient needs continued PT services  PT Diagnosis Difficulty walking   PT Problem List Decreased strength;Decreased activity tolerance;Decreased range of motion;Decreased balance;Decreased mobility;Decreased coordination;Decreased cognition;Decreased knowledge of use of DME;Decreased safety awareness;Obesity;Decreased skin integrity  PT Treatment Interventions DME instruction;Gait training;Functional mobility training;Therapeutic activities;Therapeutic exercise;Neuromuscular re-education;Balance training;Patient/family education   PT Goals (Current goals can be found in the Care Plan section) Acute Rehab PT Goals Patient Stated Goal: to go home and walk better PT Goal Formulation: With patient Time For Goal Achievement: 12/03/14 Potential to Achieve Goals: Good    Frequency Min 3X/week   Barriers to discharge Decreased caregiver support No steady assistance at home, but has help for errands    Co-evaluation               End of Session Equipment Utilized During Treatment: Other (comment)  (FWW) Activity Tolerance: Patient tolerated treatment well;Other (comment) (unsteady and has recent seizure history) Patient left: in chair;with call bell/phone within reach Nurse Communication: Mobility status         Time: 1025-1053 PT Time Calculation (min) (ACUTE ONLY): 28 min   Charges:   PT Evaluation $Initial PT Evaluation Tier I: 1 Procedure PT Treatments $Gait Training: 8-22 mins   PT G CodesRamond Dial Dec 16, 2014, 11:28 AM   Mee Hives, PT MS Acute Rehab Dept. Number: [phone]

## 2014-11-19 NOTE — Progress Notes (Signed)
Family Medicine Teaching Service Daily Progress Note Intern Pager: [phone]  Patient name: [name] Medical record number: [id number] Date of birth: [birth year] Age: 58 y.o. Gender: male  Primary Care Provider: Chrisandra Netters, MD Consultants: Neurology Code Status: Full  Pt Overview and Major Events to Date:  12/4 - Admitted with increased seizure frequency and confusion  Assessment and Plan: [name] is a 58 y.o. male presenting with seizures. PMH is significant for schizoaffective disorder, HLD, HTN, seizure disorder, and androgen deficiency (s/p self orchiectomy).  Confusion. Differential: post-ictal vs keppra vs UTI. Head CT negative. UDS unremarkable. Neurology called while patient was in the ED and recommended discontinuing keppra and starting Vimpat. -Continue home phenytoin dose -Continue Vimpat -f/u keppra level -Seizure precautions -Neurology consulted, appreciate recommendations.  -f/u PT/OT recs  Seizure disorder. Is on phenytoin and keppra at home. Patient with self reported multiple seizures. - management per above problem - f/u neuro recs  Poor Social Support. Patient lives alone with no social support and likely not taking medications at home properly. -Case management consult for home health needs  UTI. Endorses urinary frequency, no urgency, no dysuria. History of klebsiella UTIs.  - s/p 1 dose of rocephin in ED - Keflex (12/4- ) - f/u urine gram stain and culture.   Schizoaffective Disorder. - Continue home amitriptyline, klonopin, seroquel, ramelteon - Consider transition to nortriptyline for less anticholinergic side effect, though patient seems stable on current regimen   HTN. At goal - Continue home metoprolol  HLD. - Continue home statin  Androgen Deficiency: acquired s/p auto-castration many years ago. - Testosterone injection every 2 weeks - resume on discharge  FEN/GI: Regular diet, Saline lock IV Prophylaxis: SubQ  Heparin  Disposition: Admitted pending above evaluation.   Subjective:  No seizures since admission. Feeling about the same. No chest pain or shortness of breath.   Objective: Temp:  [98.2 F (36.8 C)-98.7 F (37.1 C)] 98.3 F (36.8 C) (12/04 0500) Pulse Rate:  [72-90] 72 (12/04 0500) Resp:  [11-30] 18 (12/04 0500) BP: (100-152)/(59-86) 100/59 mmHg (12/04 0500) SpO2:  [95 %-100 %] 99 % (12/04 0500) Weight:  [188 lb 8 oz (85.503 kg)] 188 lb 8 oz (85.503 kg) (12/04 0015) Physical Exam: General: Calm, NAD, lying in hospital bed Cardiovascular: RRR, no murmurs appreciated Respiratory: NWOB, CTAB Abdomen: +BS, soft, NT,ND Extremities: No cyanosis, no edema Neuro: Alert and oriented x3, No focal neurological deficits.    Laboratory:  Recent Labs Lab 11/18/14 1810  WBC 5.8  HGB 12.9*  HCT 37.7*  PLT 204    Recent Labs Lab 11/18/14 1810  NA 138  K 3.8  CL 100  CO2 26  BUN 11  CREATININE 0.96  CALCIUM 9.2  PROT 7.0  BILITOT <0.2*  ALKPHOS 97  ALT 38  AST 35  GLUCOSE 87   Phenytoin 18.2 (Therapeutic) Salicylate, APAP, EtOH levels negative UDS: Positive for benzos  Imaging/Diagnostic Tests:  Ct Head Wo Contrast  11/18/2014   CLINICAL DATA:  Sudden onset seizures today.  EXAM: CT HEAD WITHOUT CONTRAST  TECHNIQUE: Contiguous axial images were obtained from the base of the skull through the vertex without intravenous contrast.  COMPARISON:  10/20/2014  FINDINGS: There is no evidence of intracranial hemorrhage, brain edema, or other signs of acute infarction. There is no evidence of intracranial mass lesion or mass effect. No abnormal extraaxial fluid collections are identified.  Mild generalized cerebral atrophy is stable. No other intracranial abnormality identified. Ventricles are normal in size. No  skull abnormality identified.  IMPRESSION: No acute intracranial findings.  Stable mild cerebral atrophy.   Electronically Signed   By: Earle Gell M.D.   On: 11/18/2014  19:10   Dimas Chyle, MD 11/19/2014, 7:34 AM PGY-1, [facility] Intern pager: [phone], text pages welcome

## 2014-11-19 NOTE — Progress Notes (Signed)
**  Interval Note**  Patient gave oral consent to DRE.  Voiced good understanding of exam and purpose of exam in setting of UTI.  DRE performed with assistance of [facility].  Exam performed in the lateral decubitus position.  Exam: Rectum with normal tone, no external lesions, prostate palpable, prostate smooth and symmetrical without nodules, no TTP, very mild enlargement appreciated, no internal hemorrhoids appreciated.  Assessment: Mildly enlarged prostate with NO TTP.  No evidence of prostatitis.  Jasier Calabretta M. Lajuana Ripple, DO PGY-1, [facility] 11/19/14, 06:30pm

## 2014-11-19 NOTE — Progress Notes (Signed)
Occupational Therapy Evaluation Patient Details Name: [name] MRN: [id number] DOB: 1956-08-02 Today's Date: 11/19/2014    History of Present Illness 58 yo male arrived via EMS from home.pt presents with confusion, UTI (+) and questionable seizures. PMH: depression, anxiety, schizoaffective DO, hx memory loss, HTN, acute respiratory failure, opiate dependence, frozen syndrome,GERD   Clinical Impression   PTA pt lived at home alone and was independent with ADLs. Pt overall requires min guard for functional mobility, however safety concerns with pt return to home due to short term memory deficits and ability to manage medications and be safe at home. Pt also presents with LUE shoulder weakness. Pt will benefit from acute OT for therapeutic exercises for LUE and for compensatory techniques for cognition. Pt would also benefit from SNF at d/c for increased safety prior to return home.     Follow Up Recommendations  SNF;Supervision/Assistance - 24 hour    Equipment Recommendations  None recommended by OT    Recommendations for Other Services       Precautions / Restrictions Precautions Precautions: Fall;Other (comment) Precaution Comments: seizures Restrictions Weight Bearing Restrictions: No      Mobility Bed Mobility Overal bed mobility: Needs Assistance Bed Mobility: Supine to Sit Rolling: Min guard         General bed mobility comments: Min guard for safety. Pt requires increased time for sequencing and problem solving.   Transfers Overall transfer level: Needs assistance Equipment used: None (IV pole) Transfers: Sit to/from Stand Sit to Stand: Supervision         General transfer comment: Supervision for safety with sit<>stand from bed on lowest setting.     Balance Overall balance assessment: Needs assistance Sitting-balance support: No upper extremity supported;Feet supported Sitting balance-Leahy Scale: Good     Standing balance support: Single  extremity supported;During functional activity Standing balance-Leahy Scale: Fair Standing balance comment: LOB x 1 with self-correction                            ADL Overall ADL's : Needs assistance/impaired                                       General ADL Comments: Pt requires Min guard assist for ADLs and functional mobility due to mild balance deficits. Pt was able to ambulate around room with IV pole and perform grooming at sink with Min guard assist. Pt was able to self-correct for LOB once.      Vision  Pt wears glasses (does not have them here).                Additional Comments: Pt reports blurred vision (baseline) and states he got glasses with [employer] (at the drug store) and that they "help, but not enough." Pt has not been to optometrist.    Perception Perception Perception Tested?: No   Praxis Praxis Praxis tested?: Within functional limits    Pertinent Vitals/Pain Pain Assessment: No/denies pain     Hand Dominance Left   Extremity/Trunk Assessment Upper Extremity Assessment Upper Extremity Assessment: LUE deficits/detail;Overall [facility] for tasks assessed LUE Deficits / Details: L shoulder flexion weakness (3+/5) otherwise grossly 5/5. Pt reports hx of shoulder surgery (Left hemi-arthroplasty in Oct 2014 per chart). Pt reports it has been weak ever since.   LUE Coordination: decreased gross motor   Lower Extremity Assessment Lower  Extremity Assessment: Generalized weakness   Cervical / Trunk Assessment Cervical / Trunk Assessment: Normal   Communication Communication Communication: No difficulties   Cognition Arousal/Alertness: Awake/alert Behavior During Therapy: Flat affect Overall Cognitive Status: Impaired/Different from baseline Area of Impairment: Memory;Safety/judgement;Awareness;Problem solving;Following commands     Memory: Decreased short-term memory Following Commands: Follows one step commands with increased  time;Follows multi-step commands inconsistently Safety/Judgement: Decreased awareness of safety;Decreased awareness of deficits Awareness: Emergent Problem Solving: Slow processing;Decreased initiation;Difficulty sequencing;Requires verbal cues;Requires tactile cues General Comments: possibly baseline, however pt has no family present       General Comments    Exercises Cognition appears to be improving. Pt able to discuss his past (Biology major in college, worked for a while) but does present with short term memory deficits and concern about safety with medication management and safety at home.   Exercises: General Upper Extremity   AROM;Left;10 reps;Seated        Home Living Family/patient expects to be discharged to:: Private residence Living Arrangements: Alone Available Help at Discharge: Other (Comment) Type of Home: Apartment (Townhouse) Home Access: Level entry     Home Layout: One level         Bathroom Toilet: Standard     Home Equipment: None          Prior Functioning/Environment Level of Independence: Independent        Comments: Hx of seizures and schizoaffective disorder; cannot drive.     OT Diagnosis: Generalized weakness;Cognitive deficits   OT Problem List: Decreased strength;Impaired balance (sitting and/or standing);Decreased cognition;Decreased safety awareness;Decreased knowledge of use of DME or AE;Decreased knowledge of precautions;Impaired UE functional use   OT Treatment/Interventions: Self-care/ADL training;Therapeutic exercise;Energy conservation;DME and/or AE instruction;Therapeutic activities;Patient/family education;Balance training    OT Goals(Current goals can be found in the care plan section) Acute Rehab OT Goals Patient Stated Goal: to be safe at home OT Goal Formulation: With patient Time For Goal Achievement: 12/03/14 Potential to Achieve Goals: Good ADL Goals Pt Will Perform Grooming: with modified  independence;standing Pt Will Perform Lower Body Bathing: with modified independence;sit to/from stand Pt Will Perform Lower Body Dressing: with modified independence;sit to/from stand Pt Will Transfer to Toilet: with modified independence;ambulating Pt/caregiver will Perform Home Exercise Program: Increased ROM;Increased strength;Left upper extremity;Independently  OT Frequency: Min 2X/week   Barriers to D/C: Decreased caregiver support             End of Session Equipment Utilized During Treatment: Gait belt;Other (comment) (IV pole) Nurse Communication: Other (comment) (pt in chair with information on calling for assistance)  Activity Tolerance: Patient tolerated treatment well Patient left: in chair;with call bell/phone within reach   Time: 1500-1540 OT Time Calculation (min): 40 min Charges:  OT General Charges $OT Visit: 1 Procedure OT Evaluation $Initial OT Evaluation Tier I: 1 Procedure OT Treatments $Self Care/Home Management : 23-37 mins  Juluis Rainier 11/19/2014, 3:56 PM   Secundino Ginger Lynetta Mare, OTR/L Occupational Therapist [phone] (pager)

## 2014-11-20 LAB — SURGICAL PCR SCREEN
MRSA, PCR: NEGATIVE
Staphylococcus aureus: NEGATIVE

## 2014-11-20 MED ORDER — LACOSAMIDE 50 MG PO TABS
ORAL_TABLET | ORAL | Status: AC
  Filled 2014-11-20: qty 1

## 2014-11-20 MED ORDER — CLONAZEPAM 1 MG PO TABS
ORAL_TABLET | ORAL | Status: AC
  Filled 2014-11-20: qty 2

## 2014-11-20 MED ORDER — ASPIRIN 81 MG PO CHEW
CHEWABLE_TABLET | ORAL | Status: AC
  Filled 2014-11-20: qty 1

## 2014-11-20 MED ORDER — CEPHALEXIN 500 MG PO CAPS
ORAL_CAPSULE | ORAL | Status: AC
  Filled 2014-11-20: qty 1

## 2014-11-20 NOTE — Plan of Care (Signed)
Problem: Phase I Progression Outcomes Goal: Seizure activity controlled Outcome: Completed/Met Date Met:  11/20/14 Goal: IV access obtained Outcome: Completed/Met Date Met:  11/20/14 Goal: Maintaining airway and VS stable Outcome: Completed/Met Date Met:  11/20/14 Goal: Pain controlled with appropriate interventions Outcome: Completed/Met Date Met:  11/20/14 Goal: OOB as tolerated unless otherwise ordered Outcome: Completed/Met Date Met:  11/20/14 Goal: Voiding-avoid urinary catheter unless indicated Outcome: Completed/Met Date Met:  11/20/14 Goal: Hemodynamically stable Outcome: Completed/Met Date Met:  11/20/14

## 2014-11-20 NOTE — Progress Notes (Signed)
Family Medicine Teaching Service Daily Progress Note Intern Pager: [phone]  Patient name: [name] Medical record number: [id number] Date of birth: [birth year] Age: 58 y.o. Gender: male  Primary Care Provider: Chrisandra Netters, MD Consultants: Neurology Code Status: Full  Pt Overview and Major Events to Date:  12/4 - Admitted with increased seizure frequency and confusion  Assessment and Plan: [name] is a 58 y.o. male presenting with seizures. PMH is significant for schizoaffective disorder, HLD, HTN, seizure disorder, and androgen deficiency (s/p self orchiectomy).  Confusion. Differential: post-ictal vs keppra vs UTI. Head CT negative. UDS unremarkable.  -Neurology consulted, appreciate recommendations.  -PT/OT recommend SNF placement, consult to social work placed  Seizure disorder. Patient with self reported multiple seizures. On keppra at home. Neurology called while patient was in the ED and recommended discontinuing keppra and starting Vimpat. - Neurology consulted, appreciated assistance -Continue home phenytoin dose - held 12/4 evening dose -Continue Vimpat 50mg  bid -f/u keppra level -Seizure precautions -Neurology recommend ambulatory EEG as outpatient, no further inpatient work up  Poor Social Support. Patient lives alone with no social support and likely not taking medications at home properly. -Case management consult for home health needs -Anticipate discharge to SNF  UTI. Endorses urinary frequency, no urgency, no dysuria. History of klebsiella UTIs. Prostate nontender on DRE.  - s/p 1 dose of rocephin in ED - Keflex (12/4- ) - Urine gram stain: multiple bacterial morphotypes - Urine culture with no growth  Schizoaffective Disorder. - Continue home amitriptyline, klonopin, seroquel, ramelteon - Consider transition to nortriptyline for less anticholinergic side effect, though patient seems stable on current regimen   HTN. At goal - Continue home  metoprolol  HLD. - Continue home statin  Androgen Deficiency: acquired s/p auto-castration many years ago. - Testosterone injection every 2 weeks - resume on discharge  FEN/GI: Regular diet, Saline lock IV Prophylaxis: SubQ Heparin  Disposition: Admitted pending above evaluation.   Subjective:  One episode of loss of bladder continence yesterday, unwitnessed. No fevers or chills. No other complaints this morning.  Objective: Temp:  [98.2 F (36.8 C)-98.7 F (37.1 C)] 98.7 F (37.1 C) (12/05 0500) Pulse Rate:  [78-85] 78 (12/05 0500) Resp:  [16-19] 16 (12/05 0500) BP: (79-118)/(50-68) 118/59 mmHg (12/05 0500) SpO2:  [96 %-99 %] 98 % (12/05 0500) Physical Exam: General: Calm, NAD, lying in hospital bed Cardiovascular: RRR, no murmurs appreciated Respiratory: NWOB, CTAB Abdomen: +BS, soft, NT,ND Extremities: No cyanosis, no edema Neuro: Alert and oriented x3, No focal neurological deficits.    Laboratory:  Recent Labs Lab 11/18/14 1810  WBC 5.8  HGB 12.9*  HCT 37.7*  PLT 204    Recent Labs Lab 11/18/14 1810  NA 138  K 3.8  CL 100  CO2 26  BUN 11  CREATININE 0.96  CALCIUM 9.2  PROT 7.0  BILITOT <0.2*  ALKPHOS 97  ALT 38  AST 35  GLUCOSE 87   Phenytoin 18.2 (Therapeutic) Salicylate, APAP, EtOH levels negative UDS: Positive for benzos  Imaging/Diagnostic Tests:  Ct Head Wo Contrast  11/18/2014   CLINICAL DATA:  Sudden onset seizures today.  EXAM: CT HEAD WITHOUT CONTRAST  TECHNIQUE: Contiguous axial images were obtained from the base of the skull through the vertex without intravenous contrast.  COMPARISON:  10/20/2014  FINDINGS: There is no evidence of intracranial hemorrhage, brain edema, or other signs of acute infarction. There is no evidence of intracranial mass lesion or mass effect. No abnormal extraaxial fluid collections are identified.  Mild generalized cerebral atrophy is stable. No other intracranial abnormality identified. Ventricles are  normal in size. No skull abnormality identified.  IMPRESSION: No acute intracranial findings.  Stable mild cerebral atrophy.   Electronically Signed   By: Earle Gell M.D.   On: 11/18/2014 19:10   Dimas Chyle, MD 11/20/2014, 6:26 AM PGY-1, [place] Intern pager: [phone], text pages welcome

## 2014-11-21 ENCOUNTER — Inpatient Hospital Stay (HOSPITAL_COMMUNITY): Payer: Medicaid Other

## 2014-11-21 DIAGNOSIS — R062 Wheezing: Secondary | ICD-10-CM | POA: Insufficient documentation

## 2014-11-21 LAB — URINE CULTURE: Colony Count: 9000

## 2014-11-21 NOTE — Progress Notes (Signed)
Utilization review complete. Kamara Allan RN CCM Case Mgmt phone [phone] 

## 2014-11-21 NOTE — Progress Notes (Signed)
CARE MANAGEMENT NOTE 11/21/2014  Patient:  DEMONTRAE, GILBERT   Account Number:  [id number]  Date Initiated:  11/21/2014  Documentation initiated by:  [facility]  Subjective/Objective Assessment:   seizures     Action/Plan:   waiting final recommendations for home.   Anticipated DC Date:     Anticipated DC Plan:  SKILLED NURSING FACILITY  In-house referral  Clinical Social Worker      DC Planning Services  CM consult      Choice offered to / List presented to:             Status of service:  In process, will continue to follow Medicare Important Message given?   (If response is "NO", the following Medicare IM given date fields will be blank) Date Medicare IM given:   Medicare IM given by:   Date Additional Medicare IM given:   Additional Medicare IM given by:    Discharge Disposition:    Per UR Regulation:  Reviewed for med. necessity/level of care/duration of stay  If discussed at Long Length of Stay Meetings, dates discussed:    Comments:     Livia Snellen, RN Registered Nurse Signed CASE MANAGEMENT Progress Notes 11/18/2014 11:00 PM [facility] consulted to speak to patient as EDP was concerned patient is not taking his medications correctly at home and having increased confusion.  Patient listed as having Medicaid insurance living in [place].  Patient's pcp is Dr. Chrisandra Netters.  EDCM spoke to patient at bedside.  Patient is agreeable to home health services of visiting [facility] and [facility].  Patient reports he is able to perform ADL's on his own, however he does need assistance with meals.  Patient reports he is seeing a dietician who is helping him top lose weight.  Per chart review, social worker at [facility] assisted patient with connecting to meals on wheels program.  Patient reports he asks a friend to drive him to the store for groceries.  Patient does not drive.  EDCM provided patient with list of home health agencies in [facility], of which [place] was chosen.  EDCM  also provided patient with list of private duty nursing agencies.  EDCM discussed patient with EDRN.  As per EDRN, patient is unkempt and very unsteady when ambulating.  EDRN reports she would not trust patient to ambulate on his own.  EDRN also reports EMS stated that patient's house was full of trash and they were barely able to get to the patient.  EDCM placed consult to EDSW for further assistance.  Patient requesting walker on discharge.  [facility] discussed patient with EDP.  Patient to be admitted.  No further EDCM needs at this time.

## 2014-11-21 NOTE — Progress Notes (Signed)
FMTS Attending Note Patient seen and examined by me, patient reports increased wheeze and difficulty taking deep breaths. He is using incentive spirometer at bedside. No fevers or seizures since I saw him yesterday.  He reports productive cough with white sputum.  On exam he is alert, no apparent distress and no increased work of breathing.  COR Regular S1S2 PULM Diffuse wheezes heard throughout, sounds like transmitted upper airway sounds.  ABD soft.  A/P: Seizure disorder, appreciate medication changes recommended by Neurology.   New-onset wheezes and cough: 2-view CXR ordered for today. Would likely benefit from albuterol neb tx and re-examine afterward for clearing.  Dalbert Mayotte, MD

## 2014-11-21 NOTE — Plan of Care (Signed)
Problem: Phase II Progression Outcomes Goal: Pain controlled Outcome: Not Applicable Date Met:  82/50/53

## 2014-11-21 NOTE — Progress Notes (Signed)
CSW (Clinical Education officer, museum) visited pt room a second time to confirm pt was aware that with Medicaid as payer source pt would need to agree to stay at facility for at least 30 days. Pt informed CSW he would be agreeable to this but has concerns about taking care of his cat. Pt wondering if his cat can stay at the facility as well. CSW informed pt that CSW unsure if facilities will allow this. Pt also had concerns about how he will get his clothes and necessities from his home. Pt does not have anyone that could bring it to him at facility. CSW notified pt that weekday CSW would notify facilities that make a bed offer of pt needs and see if something can be worked out. Pt had no further questions.  [place], [place]netta [place]

## 2014-11-21 NOTE — Clinical Social Work Psychosocial (Signed)
     Clinical Social Work Department BRIEF PSYCHOSOCIAL ASSESSMENT 11/21/2014  Patient:  [name], [name]     Account Number:  [id number]     Admit date:  11/18/2014  Clinical Social Worker:  Adair Laundry  Date/Time:  11/21/2014 11:39 AM  Referred by:  Physician  Date Referred:  11/21/2014 Referred for  SNF Placement   Other Referral:   Interview type:  Patient Other interview type:    PSYCHOSOCIAL DATA Living Status:  ALONE Admitted from facility:   Level of care:   Primary support name:  none Primary support relationship to patient:   Degree of support available:   Pt reports having no support system    CURRENT CONCERNS Current Concerns  Post-Acute Placement   Other Concerns:    SOCIAL WORK ASSESSMENT / PLAN CSW visited pt room to discuss SNF recommendation. Pt informed CSW he was aware of recommendation is agreeable. CSW explained SNF referral process to pt. Pt agreeable to CSW sending referral to all [facility]. Pt informed CSW he does not know any of the facilities. Pt expressed to CSW he wants to find a facility that is equipped to handle his needs and provide excellent rehab. Pt participated in conversation with CSW but had limited emotions through out conversation.   Assessment/plan status:  Psychosocial Support/Ongoing Assessment of Needs Other assessment/ plan:   Information/referral to community resources:   SNF list to be provided with bed offers    PATIENTS/FAMILYS RESPONSE TO PLAN OF CARE: Pt cooperative and agreeable to SNF at dc.    [place], LCSWA  Weekend CSW  [phone]

## 2014-11-21 NOTE — Clinical Social Work Placement (Signed)
     Clinical Social Work Department CLINICAL SOCIAL WORK PLACEMENT NOTE 11/21/2014  Patient:  [name], [name]  Account Number:  [id number] Admit date:  11/18/2014  Clinical Social Worker:  Berton Mount, Latanya Presser  Date/time:  11/21/2014 11:52 AM  Clinical Social Work is seeking post-discharge placement for this patient at the following level of care:   [facility]   (*CSW will update this form in Epic as items are completed)   11/21/2014  Patient/family provided with [place] Department of Clinical Social Works list of facilities offering this level of care within the geographic area requested by the patient (or if unable, by the patients family).  11/21/2014  Patient/family informed of their freedom to choose among providers that offer the needed level of care, that participate in Medicare, Medicaid or managed care program needed by the patient, have an available bed and are willing to accept the patient.  11/21/2014  Patient/family informed of [facility] ownership interest in [facility], as well as of the fact that they are under no obligation to receive care at this facility.  PASARR submitted to EDS on  PASARR number received on   FL2 transmitted to all facilities in geographic area requested by pt/family on  11/21/2014 FL2 transmitted to all facilities within larger geographic area on   Patient informed that his/her managed care company has contracts with or will negotiate with  certain facilities, including the following:     Patient/family informed of bed offers received:   Patient chooses bed at  Physician recommends and patient chooses bed at    Patient to be transferred to  on   Patient to be transferred to facility by  Patient and family notified of transfer on  Name of family member notified:    The following physician request were entered in Epic: Physician Request  Please sign FL2.    Additional CommentsAdair Laundry Weekend  [place]

## 2014-11-21 NOTE — Plan of Care (Signed)
Problem: Phase I Progression Outcomes Goal: Initial discharge plan identified Outcome: Completed/Met Date Met:  11/21/14     

## 2014-11-21 NOTE — Progress Notes (Signed)
Family Medicine Teaching Service Daily Progress Note Intern Pager: [phone]  Patient name: [name] Medical record number: [id number] Date of birth: May 12, 1956 Age: 58 y.o. Gender: male  Primary Care Provider: Chrisandra Netters, MD Consultants: Neurology Code Status: Full  Pt Overview and Major Events to Date:  12/4 - Admitted with increased seizure frequency and confusion  Assessment and Plan: [name] is a 58 y.o. male presenting with seizures. PMH is significant for schizoaffective disorder, HLD, HTN, seizure disorder, and androgen deficiency (s/p self orchiectomy).  Confusion. Differential: post-ictal vs keppra vs UTI. Improving.  -Neurology consulted, appreciate recommendations.  -PT/OT recommend SNF placement, consult to social work placed  Seizure disorder. Patient with self reported multiple seizures. On keppra at home. Neurology called while patient was in the ED and recommended discontinuing keppra and starting Vimpat. - Neurology consulted, appreciated assistance -Continue home phenytoin dose -Continue Vimpat 50mg  bid -f/u keppra level -Seizure precautions -Neurology recommend ambulatory EEG as outpatient, no further inpatient work up  Cough. Patient reports some wheezes and difficulty with deep breaths. Has been using IS. No wheezes appreciated on my exam. -f/u CXR -Consider nebulizers if wheezing continues or if condition worsens.   Poor Social Support. Patient lives alone with no social support and likely not taking medications at home properly. -Case management consult for home health needs -Anticipate discharge to SNF  UTI. Endorses urinary frequency, no urgency, no dysuria. History of klebsiella UTIs. Prostate nontender on DRE.  - s/p 1 dose of rocephin in ED - Keflex (12/4- ) - Urine gram stain: multiple bacterial morphotypes - Urine culture: insignificant growth  Schizoaffective Disorder. - Continue home amitriptyline, klonopin, seroquel,  ramelteon - Consider transition to nortriptyline for less anticholinergic side effect, though patient seems stable on current regimen   HTN. At goal - Continue home metoprolol  HLD. - Continue home statin  Androgen Deficiency: acquired s/p auto-castration many years ago. - Testosterone injection every 2 weeks - resume on discharge  FEN/GI: Regular diet, Saline lock IV Prophylaxis: SubQ Heparin  Disposition: Medically clear for discharge to SNF when bed available.   Subjective:  No seizures. No fevers or chills. Doing well this morning. Reported some new wheezing and cough this morning.  Objective: Temp:  [97.1 F (36.2 C)-98.3 F (36.8 C)] 97.1 F (36.2 C) (12/06 0648) Pulse Rate:  [77-107] 77 (12/06 0648) Resp:  [16-20] 18 (12/06 0648) BP: (95-157)/(59-87) 95/69 mmHg (12/06 0648) SpO2:  [97 %-100 %] 97 % (12/06 1478) Physical Exam: General: Calm, NAD, lying in hospital bed Cardiovascular: RRR, no murmurs appreciated Respiratory: NWOB, CTAB no wheezes appreciated Abdomen: +BS, soft, NT,ND Extremities: No cyanosis, no edema Neuro: Alert and oriented x3, No focal neurological deficits.   Dimas Chyle, MD 11/21/2014, 8:27 AM PGY-1, [place] Intern pager: [phone], text pages welcome

## 2014-11-22 LAB — LEVETIRACETAM LEVEL: Levetiracetam Lvl: 1.2 ug/mL

## 2014-11-22 MED ORDER — AMITRIPTYLINE HCL 100 MG PO TABS
300.0000 mg | ORAL_TABLET | Freq: Every day | ORAL | Status: DC
  Administered 2014-11-22: 300 mg via ORAL
  Filled 2014-11-22: qty 3

## 2014-11-22 MED ORDER — CEPHALEXIN 500 MG PO CAPS: 500.0000 mg | ORAL_CAPSULE | Freq: Two times a day (BID) | ORAL | Status: AC

## 2014-11-22 MED ORDER — AMITRIPTYLINE HCL 100 MG PO TABS: 300.0000 mg | ORAL_TABLET | Freq: Every day | ORAL | Status: DC

## 2014-11-22 MED ORDER — AMITRIPTYLINE HCL 150 MG PO TABS: 300.0000 mg | ORAL_TABLET | Freq: Every day | ORAL | Status: DC

## 2014-11-22 MED ORDER — LACOSAMIDE 50 MG PO TABS: 50.0000 mg | ORAL_TABLET | Freq: Two times a day (BID) | ORAL | Status: DC

## 2014-11-22 NOTE — Progress Notes (Signed)
Family Medicine Teaching Service Daily Progress Note Intern Pager: [phone]  Patient name: [name] Medical record number: [id number] Date of birth: [birth year] Age: 58 y.o. Gender: male  Primary Care Provider: Chrisandra Netters, MD Consultants: Neurology Code Status: Full  Pt Overview and Major Events to Date:  12/4 - Admitted with increased seizure frequency and confusion  Assessment and Plan: [name] is a 58 y.o. male presenting with seizures. PMH is significant for schizoaffective disorder, HLD, HTN, seizure disorder, and androgen deficiency (s/p self orchiectomy).  Confusion. Differential: post-ictal vs keppra vs UTI. Improving.  -Neurology consulted, appreciate recommendations.  -PT/OT recommend SNF placement, consult to social work placed  Seizure disorder. Patient with self reported multiple seizures. On keppra at home. Neurology called while patient was in the ED and recommended discontinuing keppra and starting Vimpat. -Neurology consulted, appreciated assistance -Continue home phenytoin dose -Continue Vimpat 50mg  bid -Seizure precautions -Neurology recommend ambulatory EEG as outpatient, no further inpatient work up  Cough. Patient reports some wheezes and difficulty with deep breaths. Has been using IS. CXR shows mild diffuse peribronchial cuffing suggestive of bronchitis.  -Consider nebulizers if wheezing continues or if condition worsens.   Poor Social Support. Patient lives alone with no social support and likely not taking medications at home properly. -Case management consult for home health needs -Anticipate discharge to SNF  UTI. Endorses urinary frequency, no urgency, no dysuria. History of klebsiella UTIs. Prostate nontender on DRE.  - s/p 1 dose of rocephin in ED - Keflex (12/4- ) - Urine gram stain: multiple bacterial morphotypes - Urine culture: insignificant growth  Schizoaffective Disorder. - Continue home amitriptyline, klonopin, seroquel,  ramelteon - Consider transition to nortriptyline for less anticholinergic side effect, though patient seems stable on current regimen   HTN. At goal - Continue home metoprolol  HLD. - Continue home statin  Androgen Deficiency: acquired s/p auto-castration many years ago. - Testosterone injection every 2 weeks - resume on discharge  FEN/GI: Regular diet, Saline lock IV Prophylaxis: SubQ Heparin  Disposition: Medically clear for discharge to SNF when bed available.   Subjective:  No seizures overnight. No complaints this morning. Breathing about the same. No fevers or chills.   Objective: Temp:  [97.4 F (36.3 C)-98.7 F (37.1 C)] 97.6 F (36.4 C) (12/07 0528) Pulse Rate:  [72-86] 72 (12/07 0528) Resp:  [16-18] 16 (12/07 0528) BP: (96-156)/(54-89) 108/63 mmHg (12/07 0528) SpO2:  [96 %-98 %] 96 % (12/07 0528) Physical Exam: General: Calm, NAD, lying in hospital bed Cardiovascular: RRR, no murmurs appreciated Respiratory: NWOB, CTAB no wheezes appreciated on lung auscultation Abdomen: +BS, soft, NT,ND Extremities: No cyanosis, no edema Neuro: Alert and oriented x3, No focal neurological deficits.   Dimas Chyle, MD 11/22/2014, 8:44 AM PGY-1, [place] Intern pager: [phone], text pages welcome

## 2014-11-22 NOTE — Progress Notes (Signed)
Patient discharged home. Pt refused to go with EMS due to the cost of EMS transport. On call Social worker notified and pt was given a voucher for a taxi cab.  Discharge summary reviewed and IV removed by day-shift RN.  This RN escorted patient to taxi cab. Pt safely ambulated to cab.

## 2014-11-22 NOTE — Clinical Social Work Note (Signed)
Clinical Social Worker submitted 30 day note/ FL-2 to [facility] MUST for PASARR review around [place] currently awaiting PASARR number. [facility] currently reviewing pt's clinicals and considering extending a LOG bed offer.   CSW will continue to follow.   Glendon Axe, MSW, LCSWA [phone] 11/22/2014 3:09 PM

## 2014-11-22 NOTE — Progress Notes (Signed)
Physical Therapy Treatment Patient Details Name: [name] MRN: [id number] DOB: 08-Mar-1956 Today's Date: 11/22/2014    History of Present Illness 58 yo male arrived via EMS from home.pt presents with confusion, UTI (+) and questionable seizures. PMH: depression, anxiety, schizoaffective DO, hx memory loss, HTN, acute respiratory failure, opiate dependence, frozen syndrome,GERD    PT Comments    Patient appears more appropriate and able to follow instructions well this session. Patient able to increase ambulation this session however requires Min A for safe use of RW and for balance. Patient voicing concerns about seizures and how they may affect him longterm overall. Encouraged patient to speak with MD. Continue to recommend SNF for ongoing Physical Therapy as patient lives alone.     Follow Up Recommendations  SNF;Supervision/Assistance - 24 hour     Equipment Recommendations  Rolling walker with 5" wheels    Recommendations for Other Services       Precautions / Restrictions Precautions Precautions: Fall Precaution Comments: seizure Restrictions Weight Bearing Restrictions: No    Mobility  Bed Mobility Overal bed mobility: Needs Assistance   Rolling: Supervision         General bed mobility comments: Supervision for safety  Transfers Overall transfer level: Needs assistance     Sit to Stand: Min assist         General transfer comment: Min A to ensure balance with initial stand. Patient stood x3 progressing each stand  Ambulation/Gait Ambulation/Gait assistance: Min assist Ambulation Distance (Feet): 90 Feet (x2) Assistive device: Rolling walker (2 wheeled) Gait Pattern/deviations: Step-through pattern;Decreased stride length;Trunk flexed;Wide base of support Gait velocity: decreased Gait velocity interpretation: Below normal speed for age/gender General Gait Details: Cues for upright posture and to stand within RW. Patient tends to lean over top of RW  and keep to far out, especially with turns. Min A for RW management and safety with turns   Financial trader Rankin (Stroke Patients Only)       Balance     Sitting balance-Leahy Scale: Good       Standing balance-Leahy Scale: Fair                      Cognition Arousal/Alertness: Awake/alert Behavior During Therapy: WFL for tasks assessed/performed Overall Cognitive Status: Within Functional Limits for tasks assessed                      Exercises      General Comments        Pertinent Vitals/Pain Pain Assessment: No/denies pain    Home Living                      Prior Function            PT Goals (current goals can now be found in the care plan section) Progress towards PT goals: Progressing toward goals    Frequency  Min 3X/week    PT Plan Current plan remains appropriate    Co-evaluation             End of Session Equipment Utilized During Treatment: Gait belt Activity Tolerance: Patient tolerated treatment well Patient left: in chair;with call bell/phone within reach;with chair alarm set     Time: 8601207012 PT Time Calculation (min) (ACUTE ONLY): 27 min  Charges:  $Gait Training: 8-22 mins $Therapeutic Activity: 8-22 mins  G Codes:      Jacqualyn Posey 11/22/2014, 9:04 AM 11/22/2014 Jacqualyn Posey PTA [phone] pager [phone] office

## 2014-11-22 NOTE — Clinical Social Work Note (Signed)
Clinical Education officer, museum submitted for [employer] number. [place] Must requires submission of 30 day note and other clinicals. CSW contacted Family Medicine Teaching Service for FL-2/ 30 day note signatures. CSW awaiting MD signatures to fax documents for PASARR number.   CSW will continue to follow patient for support and facilitate pt's dc needs once medically stable.   Glendon Axe, MSW, LCSWA [phone] 11/22/2014 1:39 PM

## 2014-11-22 NOTE — Progress Notes (Signed)
Occupational Therapy Treatment Patient Details Name: CAMRY THEISS MRN: [id number] DOB: [birth year] Today's Date: 11/22/2014    History of present illness 58 yo male arrived via EMS from home.pt presents with confusion, UTI (+) and questionable seizures. PMH: depression, anxiety, schizoaffective DO, hx memory loss, HTN, acute respiratory failure, opiate dependence, frozen syndrome,GERD   OT comments  Pt denied SNF. Plan is for pt to D/C home. Recommend HHOT. Pt given written HEP for LUE. Pt able to return demonstrate exercises.   Follow Up Recommendations  Home health OT;Supervision - Intermittent (denied by SNF)    Equipment Recommendations  None recommended by OT    Recommendations for Other Services      Precautions / Restrictions Precautions Precautions: Fall Precaution Comments: seizure       Mobility Bed Mobility Overal bed mobility: Modified Independent                Transfers Overall transfer level: Modified independent                    Balance                                   ADL Overall ADL's : Modified independent                                              Vision                     Perception     Praxis      Cognition   Behavior During Therapy: WFL for tasks assessed/performed Overall Cognitive Status: No family/caregiver present to determine baseline cognitive functioning                       Extremity/Trunk Assessment               Exercises Other Exercises Other Exercises: ER/IR theraband - level 1 Other Exercises: RTC strenthening - FF; shoulder scaption; abduction Other Exercises: shoulder stretches Other Exercises: scapular strengthening all planes   Shoulder Instructions       General Comments      Pertinent Vitals/ Pain       Pain Assessment: 0-10 Pain Score: 3  Pain Location: L shoulder at end range  Home Living                                           Prior Functioning/Environment              Frequency Min 2X/week     Progress Toward Goals  OT Goals(current goals can now be found in the care plan section)  Progress towards OT goals: Progressing toward goals  Acute Rehab OT Goals Patient Stated Goal: to be safe at home OT Goal Formulation: With patient Time For Goal Achievement: 12/03/14 Potential to Achieve Goals: Good ADL Goals Pt Will Perform Grooming: with modified independence;standing Pt Will Perform Lower Body Bathing: with modified independence;sit to/from stand Pt Will Perform Lower Body Dressing: with modified independence;sit to/from stand Pt Will Transfer to Toilet: with modified independence;ambulating Pt/caregiver will Perform Home Exercise Program: Increased ROM;Increased strength;Left upper extremity;Independently  Plan  Discharge plan needs to be updated    Co-evaluation                 End of Session     Activity Tolerance Patient tolerated treatment well   Patient Left Other (comment) (up walking in room)   Nurse Communication Mobility status        Time: 0981-1914 OT Time Calculation (min): 29 min  Charges: OT General Charges $OT Visit: 1 Procedure OT Treatments $Therapeutic Activity: 23-37 mins  [place],HILLARY 11/22/2014, 5:19 PM   [facility], OTR/L  [phone] 11/22/2014

## 2014-11-22 NOTE — Clinical Social Work Note (Addendum)
Clinical Education officer, museum did obtain PASARR number from [place] spoke with [facility] which reported patient is not appropriate for SNF. CSW notified CSW supervisor, 4N RNCM, and Family Medicine Teaching Service Resident.   Patient will dc home with home care services. CSW to arrange PTAR transportation home. CSW to sign off.   Glendon Axe, MSW, LCSWA [phone] 11/22/2014 3:39 PM

## 2014-11-22 NOTE — Progress Notes (Signed)
Received call from [place] - SNF/ [facility] will not accept patient because he does not have a skillable need; Talked to patient about [place] choices, patient chose [place] for [facility]; unable to arrange HHPT due to patient does not have a qualifying diagnosis; Soc Worker to arrange transportation home; Mindi Slicker RN,BSN,MHA [phone]

## 2014-11-22 NOTE — Discharge Instructions (Signed)
You were admitted for confusion and seizures. While here we adjusted your seizure medications. We also treated you for a urinary tract infection while you were here. It is important that you follow up with your primary care doctor. We will be setting you up with a home health aide.  Epilepsy Epilepsy is a disorder in which a person has repeated seizures over time. A seizure is a release of abnormal electrical activity in the brain. Seizures can cause a change in attention, behavior, or the ability to remain awake and alert (altered mental status). Seizures often involve uncontrollable shaking (convulsions).  Most people with epilepsy lead normal lives. However, people with epilepsy are at an increased risk of falls, accidents, and injuries. Therefore, it is important to begin treatment right away. CAUSES  Epilepsy has many possible causes. Anything that disturbs the normal pattern of brain cell activity can lead to seizures. This may include:   Head injury.  Birth trauma.  High fever as a child.  Stroke.  Bleeding into or around the brain.  Certain drugs.  Prolonged low oxygen, such as what occurs after CPR efforts.  Abnormal brain development.  Certain illnesses, such as meningitis, encephalitis (brain infection), malaria, and other infections.  An imbalance of nerve signaling chemicals (neurotransmitters).  SIGNS AND SYMPTOMS  The symptoms of a seizure can vary greatly from one person to another. Right before a seizure, you may have a warning (aura) that a seizure is about to occur. An aura may include the following symptoms:  Fear or anxiety.  Nausea.  Feeling like the room is spinning (vertigo).  Vision changes, such as seeing flashing lights or spots. Common symptoms during a seizure include:  Abnormal sensations, such as an abnormal smell or a bitter taste in the mouth.   Sudden, general body stiffness.   Convulsions that involve rhythmic jerking of the face, arm,  or leg on one or both sides.   Sudden change in consciousness.   Appearing to be awake but not responding.   Appearing to be asleep but cannot be awakened.   Grimacing, chewing, lip smacking, drooling, tongue biting, or loss of bowel or bladder control. After a seizure, you may feel sleepy for a while. DIAGNOSIS  Your health care provider will ask about your symptoms and take a medical history. Descriptions from any witnesses to your seizures will be very helpful in the diagnosis. A physical exam, including a detailed neurological exam, is necessary. Various tests may be done, such as:   An electroencephalogram (EEG). This is a painless test of your brain waves. In this test, a diagram is created of your brain waves. These diagrams can be interpreted by a specialist.  An MRI of the brain.   A CT scan of the brain.   A spinal tap (lumbar puncture, LP).  Blood tests to check for signs of infection or abnormal blood chemistry. TREATMENT  There is no cure for epilepsy, but it is generally treatable. Once epilepsy is diagnosed, it is important to begin treatment as soon as possible. For most people with epilepsy, seizures can be controlled with medicines. The following may also be used:  A pacemaker for the brain (vagus nerve stimulator) can be used for people with seizures that are not well controlled by medicine.  Surgery on the brain. For some people, epilepsy eventually goes away. HOME CARE INSTRUCTIONS   Follow your health care provider's recommendations on driving and safety in normal activities.  Get enough rest. Lack of sleep  can cause seizures.  Only take over-the-counter or prescription medicines as directed by your health care provider. Take any prescribed medicine exactly as directed.  Avoid any known triggers of your seizures.  Keep a seizure diary. Record what you recall about any seizure, especially any possible trigger.   Make sure the people you live and  work with know that you are prone to seizures. They should receive instructions on how to help you. In general, a witness to a seizure should:   Cushion your head and body.   Turn you on your side.   Avoid unnecessarily restraining you.   Not place anything inside your mouth.   Call for emergency medical help if there is any question about what has occurred.   Follow up with your health care provider as directed. You may need regular blood tests to monitor the levels of your medicine.  SEEK MEDICAL CARE IF:   You develop signs of infection or other illness. This might increase the risk of a seizure.   You seem to be having more frequent seizures.   Your seizure pattern is changing.  SEEK IMMEDIATE MEDICAL CARE IF:   You have a seizure that does not stop after a few moments.   You have a seizure that causes any difficulty in breathing.   You have a seizure that results in a very severe headache.   You have a seizure that leaves you with the inability to speak or use a part of your body.  Document Released: 12/03/2005 Document Revised: 09/23/2013 Document Reviewed: 07/15/2013 [employer] Patient Information 2015 [place], [place]. This information is not intended to replace advice given to you by your health care provider. Make sure you discuss any questions you have with your health care provider.

## 2014-11-22 NOTE — Discharge Summary (Signed)
[facility] Discharge Summary  Patient name: [name] Medical record number: [id number] Date of birth: 11-Dec-1956 Age: 58 y.o. Gender: male Date of Admission: 11/18/2014  Date of Discharge:11/22/2014 Admitting Physician: Zigmund Gottron, MD  Primary Care Provider: Chrisandra Netters, MD Consultants: Neurology  Indication for Hospitalization: Confusion, seizures  Discharge Diagnoses/Problem List:  Confusion, seizure disorder, UTI, poor social support, schizoaffective disorder, HLD, HTN, androgen deficiency (s/p self orchiectomy)  Disposition: Home  Discharge Condition: Improved  Discharge Exam: Please see progress note for day of discharge  Brief Hospital Course:  [name] is a 58 y.o. Male who presented with seizures. PMH is significant for schizoaffective disorder, HLD, HTN, seizure disorder, and androgen deficiency (s/p self orchiectomy).  His hospital course by problem is listed below:  Seizure disorder and confusion. Patient presented with 3-4 seizures per day for the past month. Patient was hospitalized with breakthrough seizures approximately 1 month prior to this admission and was placed on phenytoin and keppra. Patient also reported increased confusion for the month prior to admission. Differential at the time of admission included post-ictal effects vs effects of keppra vs UTI. Neurology was consulted and recommended that the patient's regimen be changed to phenytoin and vimpat. With this new regimen, the patient remained stable with no observed seizure activity and improvement in his self-reported confusion. Patient was evaluated by PT/OT who recommended SNF placement.   Poor Social Support. Patient lives alone with no social support and likely not taking medications at home properly. Case management was consulted for patient's home health needs. He will be discharged to SNF.   UTI. Present on admission. Patient endorsed a several month  history of urinary frequency but denied any other symptoms. DRE revealed a non-tender prostate.  Patient was started on keflex during his stay here.    Cough/wheezes. Patient reported some wheezes and difficulty with deep breaths. Lung exam remained without wheezes or other objective findings. CXR showed mild diffuse peribronchial cuffing suggestive of bronchitis. There was no other intervention during this hospitalization.    The patient's other chronic medical conditions, including schizoaffective disorder, HTN, HLD, and androgen deficiency were stable and no changes were made to his home regimens.    Issues for Follow Up:  1) Ambulatory EEG as outpatient, per neurology recommendations.  2) Consider transition from amytriptyline to nortriptyline for less anticholinergic side effect, though patient seems stable on current regimen  3) Consider consult for home health aide once patient is suitable for discharge home given patient's poor support and questionable adherence to medication regimen  Significant Procedures: None  Significant Labs and Imaging:   Recent Labs Lab 11/18/14 1810  WBC 5.8  HGB 12.9*  HCT 37.7*  PLT 204    Recent Labs Lab 11/18/14 1810  NA 138  K 3.8  CL 100  CO2 26  GLUCOSE 87  BUN 11  CREATININE 0.96  CALCIUM 9.2  ALKPHOS 97  AST 35  ALT 38  ALBUMIN 3.8    Phenytoin 18.2 (Therapeutic) Salicylate, APAP, EtOH levels negative UDS: Positive for benzos  Ct Head Wo Contrast  11/18/2014   CLINICAL DATA:  Sudden onset seizures today.  EXAM: CT HEAD WITHOUT CONTRAST  TECHNIQUE: Contiguous axial images were obtained from the base of the skull through the vertex without intravenous contrast.  COMPARISON:  10/20/2014  FINDINGS: There is no evidence of intracranial hemorrhage, brain edema, or other signs of acute infarction. There is no evidence of intracranial mass lesion or mass effect.  No abnormal extraaxial fluid collections are identified.  Mild  generalized cerebral atrophy is stable. No other intracranial abnormality identified. Ventricles are normal in size. No skull abnormality identified.  IMPRESSION: No acute intracranial findings.  Stable mild cerebral atrophy.   Electronically Signed   By: Earle Gell M.D.   On: 11/18/2014 19:10   Results/Tests Pending at Time of Discharge: None  Discharge Medications:    Medication List    STOP taking these medications        levETIRAcetam 500 MG tablet  Commonly known as:  KEPPRA      TAKE these medications        amitriptyline 150 MG tablet  Commonly known as:  ELAVIL  Take 2 tablets (300 mg total) by mouth at bedtime.     aspirin EC 81 MG tablet  Take 81 mg by mouth daily.     cephALEXin 500 MG capsule  Commonly known as:  KEFLEX  Take 1 capsule (500 mg total) by mouth every 12 (twelve) hours.     clonazePAM 2 MG tablet  Commonly known as:  KLONOPIN  Take 2 mg by mouth 4 (four) times daily.     lacosamide 50 MG Tabs tablet  Commonly known as:  VIMPAT  Take 1 tablet (50 mg total) by mouth 2 (two) times daily.     metoprolol succinate 50 MG 24 hr tablet  Commonly known as:  TOPROL-XL  Take 1 tablet (50 mg total) by mouth at bedtime. Take with or immediately following a meal.     Multiple Vitamins tablet  Take 1 tablet by mouth daily.     omeprazole 20 MG capsule  Commonly known as:  PRILOSEC  Take 1 capsule (20 mg total) by mouth daily as needed. For heartburn or acid reflux.     phenytoin 50 MG tablet  Commonly known as:  DILANTIN  Chew 4 tablets (200 mg total) by mouth at bedtime.     polyethylene glycol packet  Commonly known as:  MIRALAX / GLYCOLAX  Take 17 g by mouth daily as needed. For constipation. Mix into 8 ounces of fluid & drink     QUEtiapine 300 MG tablet  Commonly known as:  SEROQUEL  Take 3 tablets (900 mg total) by mouth at bedtime. Takes 3 tabs QHS     ramelteon 8 MG tablet  Commonly known as:  ROZEREM  Take 1 tablet (8 mg total) by mouth  at bedtime.     simvastatin 40 MG tablet  Commonly known as:  ZOCOR  Take 1 tablet (40 mg total) by mouth at bedtime.     testosterone cypionate 200 MG/ML injection  Commonly known as:  DEPOTESTOTERONE CYPIONATE  Inject 2 mLs (400 mg total) into the muscle every 14 (fourteen) days.        Discharge Instructions: Please refer to Patient Instructions section of EMR for full details.  Patient was counseled important signs and symptoms that should prompt return to medical care, changes in medications, dietary instructions, activity restrictions, and follow up appointments.   Follow-Up Appointments: Follow-up Information    Follow up with Beverlyn Roux, MD In 1 week.   Specialty:  Family Medicine   Why:  4:15pm for follow up   Contact information:   [place] [address] [phone]       Dimas Chyle, MD 11/22/2014, 4:24 PM PGY-1, [place]

## 2014-11-22 NOTE — Clinical Social Work Note (Signed)
Clinical Social Worker contacted TEFL teacher to meet with patient for SNF placement. GL will review clinicals before extending bed offer. Per GL's RN Liaison, patient expressed concerns of someone caring for his cat while he is at [facility]. RN Liaison reported patient's cat could visit but could not stay at facility. Patient will have 30 day LOG.  FL-2 on chart for MD signature.  CSW will continue to follow for disposition needs.   Glendon Axe, MSW, LCSWA [phone] 11/22/2014 12:58 PM

## 2014-11-23 ENCOUNTER — Telehealth: Payer: Self-pay | Admitting: Family Medicine

## 2014-11-23 NOTE — Telephone Encounter (Signed)
Had seizures while in the hospital.  Was given medicaiton in the hospital.  He was given a RX for this new meds but has lost it. Needs new prescription  Please advise wouild like to have it tonight. Please advise

## 2014-11-23 NOTE — Telephone Encounter (Signed)
Upon further review of chart it looks like all meds were electronically sent to pharmacy, called and informed patient of this, he will check with the pharmacy.

## 2014-11-23 NOTE — Telephone Encounter (Signed)
Will forward to PCP, patient scheduled for hfu on 12/14.

## 2014-11-24 ENCOUNTER — Telehealth: Payer: Self-pay | Admitting: Family Medicine

## 2014-11-24 ENCOUNTER — Other Ambulatory Visit: Payer: Self-pay | Admitting: Family Medicine

## 2014-11-24 NOTE — Telephone Encounter (Signed)
Rx called in patient informed.  

## 2014-11-24 NOTE — Telephone Encounter (Signed)
Pt asking to speak with RN, was given rx for seizures when discharged from the hospital, pt lost the rx and needs it asap or he will start to have seizures again

## 2014-11-25 ENCOUNTER — Other Ambulatory Visit: Payer: Self-pay | Admitting: *Deleted

## 2014-11-26 ENCOUNTER — Other Ambulatory Visit: Payer: Self-pay | Admitting: Family Medicine

## 2014-11-29 ENCOUNTER — Ambulatory Visit (INDEPENDENT_AMBULATORY_CARE_PROVIDER_SITE_OTHER): Payer: Medicaid Other | Admitting: Family Medicine

## 2014-11-29 ENCOUNTER — Encounter: Payer: Self-pay | Admitting: Family Medicine

## 2014-11-29 VITALS — BP 104/65 | HR 112 | Temp 98.6°F | Ht 71.0 in | Wt 180.0 lb

## 2014-11-29 DIAGNOSIS — R062 Wheezing: Secondary | ICD-10-CM | POA: Insufficient documentation

## 2014-11-29 DIAGNOSIS — W19XXXA Unspecified fall, initial encounter: Secondary | ICD-10-CM

## 2014-11-29 MED ORDER — AMITRIPTYLINE HCL 150 MG PO TABS: 300.0000 mg | ORAL_TABLET | Freq: Every day | ORAL | Status: DC

## 2014-11-29 MED ORDER — LACOSAMIDE 50 MG PO TABS: 50.0000 mg | ORAL_TABLET | Freq: Two times a day (BID) | ORAL | Status: DC

## 2014-11-29 MED ORDER — QUETIAPINE FUMARATE 300 MG PO TABS: 900.0000 mg | ORAL_TABLET | Freq: Every day | ORAL | Status: DC

## 2014-11-29 MED ORDER — ALBUTEROL SULFATE HFA 108 (90 BASE) MCG/ACT IN AERS: 2.0000 | INHALATION_SPRAY | Freq: Four times a day (QID) | RESPIRATORY_TRACT | Status: DC | PRN

## 2014-11-29 MED ORDER — PHENYTOIN 50 MG PO CHEW: 200.0000 mg | CHEWABLE_TABLET | Freq: Every day | ORAL | Status: DC

## 2014-11-29 MED ORDER — CLONAZEPAM 2 MG PO TABS: 2.0000 mg | ORAL_TABLET | Freq: Four times a day (QID) | ORAL | Status: DC

## 2014-11-29 MED ORDER — SIMVASTATIN 40 MG PO TABS: 40.0000 mg | ORAL_TABLET | Freq: Every day | ORAL | Status: DC

## 2014-11-29 NOTE — Patient Instructions (Signed)
You are on many medications that can cause brain fog and the other sedation symptoms you are describing. I do not feel comfortable adjusting these when you have neurology and psychiatry specialists managing these medications. I recommend you discuss your options with them and decide if there are adjustments that can be made to decrease your sedation and improve your quality of life.

## 2014-12-06 NOTE — Progress Notes (Signed)
   Subjective:    Patient ID: [name], male    DOB: 1956-11-13, 58 y.o.   MRN: [id number]  HPI Pt presents for f/u of hospitalization for seizures. He has had no seizure activity since discharge when he was started on vimpat in addition to his prior antiepileptics. He is complaining of worsened brain fog on this medicine but also reports progressive brain fog for the last 40 years. He is on many sedating meds and sees both a neurologist and psychiatrist regularly. He reports he has fallen several times recently because he cannot think clearly or react promptly.   Review of Systems  Neurological: Positive for weakness and light-headedness. Negative for seizures.  Psychiatric/Behavioral: Positive for confusion and sleep disturbance. Negative for suicidal ideas.  All other systems reviewed and are negative.      Objective:   Physical Exam  Constitutional: He is oriented to person, place, and time. He appears well-developed and well-nourished. No distress.  HENT:  Head: Normocephalic and atraumatic.  Eyes: Conjunctivae are normal. Right eye exhibits no discharge. Left eye exhibits no discharge. No scleral icterus.  Cardiovascular: Normal rate.   Pulmonary/Chest: Effort normal.  Abdominal: He exhibits no distension.  Musculoskeletal: Normal range of motion. He exhibits no edema or tenderness.  Neurological: He is alert and oriented to person, place, and time. No cranial nerve deficit. Coordination abnormal.  Skin: Skin is warm and dry. No rash noted. He is not diaphoretic.  Psychiatric: He has a normal mood and affect. His behavior is normal.  Nursing note and vitals reviewed.         Assessment & Plan:

## 2014-12-06 NOTE — Assessment & Plan Note (Addendum)
Falls and poor quality of life secondary to sedation, likely from multiple medications. Had a long discussion about this issue. Patients wants something to change but is very anxious about increasing seizures or worsening depression if any meds are reduced - Will not adjust meds managed by psych or neuro but recommend patient address these concerns with them and he says he will

## 2014-12-14 ENCOUNTER — Ambulatory Visit: Payer: Self-pay | Admitting: Family Medicine

## 2014-12-28 ENCOUNTER — Telehealth: Payer: Self-pay | Admitting: Neurology

## 2014-12-28 ENCOUNTER — Encounter: Payer: Self-pay | Admitting: Neurology

## 2014-12-28 ENCOUNTER — Ambulatory Visit (INDEPENDENT_AMBULATORY_CARE_PROVIDER_SITE_OTHER): Payer: Medicaid Other | Admitting: Neurology

## 2014-12-28 VITALS — BP 142/78 | HR 81 | Ht 71.0 in | Wt 181.8 lb

## 2014-12-28 DIAGNOSIS — Z5181 Encounter for therapeutic drug level monitoring: Secondary | ICD-10-CM

## 2014-12-28 DIAGNOSIS — G40909 Epilepsy, unspecified, not intractable, without status epilepticus: Secondary | ICD-10-CM

## 2014-12-28 MED ORDER — LACOSAMIDE 50 MG PO TABS: ORAL_TABLET | ORAL | Status: DC

## 2014-12-28 NOTE — Patient Instructions (Signed)

## 2014-12-28 NOTE — Procedures (Signed)
      History: [name] is a 59 year old gentleman with a 30 year history of seizures. The patient has had poor control the seizures over the last several months, and he had a seizure that was witnessed in the office today. He is being evaluated for this recent increase in his seizure events.  This is a routine EEG. No skull defects are noted. Medications include amitriptyline, aspirin, clonazepam, Vimpat, Toprol, multivitamins, Prilosec, Dilantin, MiraLAX, Seroquel, Rozerem, Zocor, and testosterone.  EEG classification: Dysrhythmia grade 2 left temporal  Description of the recording: The background rhythms of this recording consists of a relatively well modulated medium amplitude alpha rhythm of 8 Hz that is reactive to eye opening closure. As the record progresses, photic stimulation is performed, and this results in a bilateral and symmetric photic driving response. Hyperventilation is also performed, and this results in a minimal buildup of the background rhythm activities without significant slowing seen. Throughout the recording, intermittent theta frequency slowing is seen in the mid temporal region on the left, sometimes associated with sharp transients. At no time during the recording does there appear to be evidence of spike or spike-wave discharges or evidence of an electrographic seizure. EKG monitor shows no evidence of cardiac rhythm abnormalities with a heart rate of 90.  Impression: This is an abnormal EEG recording secondary to dysrhythmic theta activity and sharp transients emanating from the left mid temporal region. This study suggests a left brain seizure focus, no electrographic seizure was recorded during the study, however.

## 2014-12-28 NOTE — Telephone Encounter (Signed)
I called the patient. The EEG study is abnormal, evidence of a left brain focus.

## 2014-12-28 NOTE — Progress Notes (Signed)
Reason for visit: Seizures   [name] is a 59 y.o. male  History of present illness:  [name] is a 59 year old left-handed white male with a history of epilepsy. The patient been followed through this office in the past, he has not been seen since 2009. Indicates that the [facility] family practice group has been following him for his seizures, and he has been treated with Dilantin and Keppra. He seemed to be relatively well controlled until just recently. The patient again having seizures in the summer 2015. The patient was recently admitted to the hospital in December 2015 with recurring seizures. He was taken off of the Keppra, and switched to Vimpat along with the Dilantin. The patient lives alone. He has a friend that may drive him to get his groceries, etc. The patient indicates that he may miss a dose of medication every once in a while. The patient comes in the office today for reevaluation for the seizure events. A CT scan of brain was recently done in the hospital, this was unremarkable. There have been no recent EEG evaluations. The patient reports he may get a headache at the onset of the seizure. He is having 2 or 3 seizure events a week. The patient has some occasional nausea with the seizures. He reports no focal numbness or weakness of the face, arms, or legs. He experienced a seizure today in our office.  Past Medical History  Diagnosis Date  . Seizures   . Hypertension   . GERD (gastroesophageal reflux disease)   . Dyslipidemia   . Schizoaffective disorder   . Depression   . Anxiety   . MRSA carrier     Past Surgical History  Procedure Laterality Date  . Self orchectomy    . Colonoscopy  2010  . Orif shoulder fracture    . Shoulder closed reduction Left 09/16/2013    Procedure: CLOSED REDUCTION SHOULDER;  Surgeon: Mauri Pole, MD;  Location: [facility];  Service: Orthopedics;  Laterality: Left;  . Shoulder hemi-arthroplasty Left 09/18/2013    Procedure: LEFT  SHOULDER HEMI-ARTHROPLASTY;  Surgeon: Augustin Schooling, MD;  Location: [place];  Service: Orthopedics;  Laterality: Left;    Family History  Problem Relation Age of Onset  . Stroke Father     Living at 74  . Stroke Mother     Stroke in late 36's. Died in her 48's    Social history:  reports that he has never smoked. He has never used smokeless tobacco. He reports that he does not drink alcohol or use illicit drugs.  Medications:  Current Outpatient Prescriptions on File Prior to Visit  Medication Sig Dispense Refill  . amitriptyline (ELAVIL) 150 MG tablet Take 2 tablets (300 mg total) by mouth at bedtime. 60 tablet 3  . aspirin EC 81 MG tablet Take 81 mg by mouth daily.    . clonazePAM (KLONOPIN) 2 MG tablet Take 1 tablet (2 mg total) by mouth 4 (four) times daily. 120 tablet 5  . metoprolol succinate (TOPROL-XL) 50 MG 24 hr tablet Take 1 tablet (50 mg total) by mouth at bedtime. Take with or immediately following a meal. 30 tablet 0  . Multiple Vitamins tablet Take 1 tablet by mouth daily.    Marland Kitchen omeprazole (PRILOSEC) 20 MG capsule Take 1 capsule (20 mg total) by mouth daily as needed. For heartburn or acid reflux. 30 capsule 6  . phenytoin (DILANTIN) 50 MG tablet Chew 4 tablets (200  mg total) by mouth at bedtime. 120 tablet 6  . polyethylene glycol (MIRALAX / GLYCOLAX) packet Take 17 g by mouth daily as needed. For constipation. Mix into 8 ounces of fluid & drink 30 each 6  . QUEtiapine (SEROQUEL) 300 MG tablet Take 3 tablets (900 mg total) by mouth at bedtime. Takes 3 tabs QHS 90 tablet 6  . ramelteon (ROZEREM) 8 MG tablet Take 1 tablet (8 mg total) by mouth at bedtime. 30 tablet 2  . simvastatin (ZOCOR) 40 MG tablet Take 1 tablet (40 mg total) by mouth at bedtime. 30 tablet 6  . testosterone cypionate (DEPOTESTOTERONE CYPIONATE) 200 MG/ML injection Inject 2 mLs (400 mg total) into the muscle every 14 (fourteen) days. 10 mL 0   No current facility-administered medications on file prior to  visit.      Allergies  Allergen Reactions  . Codeine Nausea And Vomiting  . Sulfa Antibiotics Nausea And Vomiting    ROS:  Out of a complete 14 system review of symptoms, the patient complains only of the following symptoms, and all other reviewed systems are negative.  Seizures Memory disturbance Headache  Blood pressure 142/78, pulse 81, height 5\' 11"  (1.803 m), weight 181 lb 12.8 oz (82.464 kg).  Physical Exam  General: The patient is alert and cooperative at the time of the examination. The patient is moderately obese.  Eyes: Pupils are equal, round, and reactive to light. Discs are flat bilaterally.  Neck: The neck is supple, no carotid bruits are noted.  Respiratory: The respiratory examination is clear.  Cardiovascular: The cardiovascular examination reveals a regular rate and rhythm, no obvious murmurs or rubs are noted.  Skin: Extremities are without significant edema.  Neurologic Exam  Mental status: The patient is somewhat sleepy, will converse following a seizure today.  Cranial nerves: Facial symmetry is present. There is good sensation of the face to pinprick and soft touch bilaterally. The strength of the facial muscles and the muscles to head turning and shoulder shrug are normal bilaterally. Speech is well enunciated, no aphasia or dysarthria is noted. Extraocular movements are full. Visual fields are full. The tongue is midline, and the patient has symmetric elevation of the soft palate. No obvious hearing deficits are noted.  Motor: The motor testing reveals 5 over 5 strength of all 4 extremities. Good symmetric motor tone is noted throughout.  Sensory: Sensory testing is intact to pinprick, soft touch, and vibration sensation on all 4 extremities. No evidence of extinction is noted.  Coordination: Cerebellar testing reveals good finger-nose-finger and heel-to-shin bilaterally.  Gait and station: Gait was not tested.  Reflexes: Deep tendon reflexes  are symmetric and normal bilaterally. Toes are downgoing bilaterally.    CT head 11/18/14:  IMPRESSION: No acute intracranial findings. Stable mild cerebral atrophy.   Assessment/Plan:  1. Intractable epilepsy  2. Depression, anxiety  The patient may have issues with medical noncompliance. The patient had a seizure today in the office associated with generalized convulsion, postictal phase lasting about 10-15 minutes. The patient indicates that he may not be taking his medications properly. The patient will be sent for Dilantin level today, the Vimpat will be increased taking 50 mg the morning and 100 mg in the evening. He will have an EEG study. We will get home health nursing to come out to the house to help manage his medications. He will need follow-up in about 2 months.  [name] MD 12/28/2014 8:17 PM  [facility] [address]  [place], [place] [address]  Phone [phone] Fax [phone]

## 2014-12-29 ENCOUNTER — Telehealth: Payer: Self-pay | Admitting: *Deleted

## 2014-12-29 LAB — PHENYTOIN LEVEL, TOTAL: Phenytoin Lvl: 11.5 ug/mL (ref 10.0–20.0)

## 2014-12-29 NOTE — Telephone Encounter (Signed)
[facility] called to request NPI number.  NPI number given.  Derl Barrow, RN

## 2014-12-30 ENCOUNTER — Ambulatory Visit: Payer: Self-pay | Admitting: Family Medicine

## 2014-12-30 ENCOUNTER — Emergency Department (HOSPITAL_COMMUNITY): Payer: Medicaid Other

## 2014-12-30 ENCOUNTER — Observation Stay (HOSPITAL_COMMUNITY)
Admission: EM | Admit: 2014-12-30 | Discharge: 2014-12-31 | Discharge status: Against Medical Advice | Payer: Medicaid Other | Attending: Family Medicine | Admitting: Family Medicine

## 2014-12-30 ENCOUNTER — Encounter (HOSPITAL_COMMUNITY): Payer: Self-pay | Admitting: Emergency Medicine

## 2014-12-30 DIAGNOSIS — G40909 Epilepsy, unspecified, not intractable, without status epilepticus: Secondary | ICD-10-CM | POA: Diagnosis not present

## 2014-12-30 DIAGNOSIS — Z885 Allergy status to narcotic agent status: Secondary | ICD-10-CM | POA: Diagnosis not present

## 2014-12-30 DIAGNOSIS — F251 Schizoaffective disorder, depressive type: Secondary | ICD-10-CM | POA: Insufficient documentation

## 2014-12-30 DIAGNOSIS — F1120 Opioid dependence, uncomplicated: Secondary | ICD-10-CM | POA: Diagnosis not present

## 2014-12-30 DIAGNOSIS — R109 Unspecified abdominal pain: Secondary | ICD-10-CM

## 2014-12-30 DIAGNOSIS — E663 Overweight: Secondary | ICD-10-CM | POA: Insufficient documentation

## 2014-12-30 DIAGNOSIS — I951 Orthostatic hypotension: Secondary | ICD-10-CM | POA: Insufficient documentation

## 2014-12-30 DIAGNOSIS — Z882 Allergy status to sulfonamides status: Secondary | ICD-10-CM | POA: Diagnosis not present

## 2014-12-30 DIAGNOSIS — K219 Gastro-esophageal reflux disease without esophagitis: Secondary | ICD-10-CM | POA: Insufficient documentation

## 2014-12-30 DIAGNOSIS — R4182 Altered mental status, unspecified: Secondary | ICD-10-CM | POA: Diagnosis not present

## 2014-12-30 DIAGNOSIS — R569 Unspecified convulsions: Secondary | ICD-10-CM | POA: Insufficient documentation

## 2014-12-30 DIAGNOSIS — I10 Essential (primary) hypertension: Secondary | ICD-10-CM | POA: Insufficient documentation

## 2014-12-30 DIAGNOSIS — E785 Hyperlipidemia, unspecified: Secondary | ICD-10-CM | POA: Insufficient documentation

## 2014-12-30 DIAGNOSIS — M8580 Other specified disorders of bone density and structure, unspecified site: Secondary | ICD-10-CM | POA: Diagnosis not present

## 2014-12-30 DIAGNOSIS — N3944 Nocturnal enuresis: Secondary | ICD-10-CM | POA: Insufficient documentation

## 2014-12-30 DIAGNOSIS — F332 Major depressive disorder, recurrent severe without psychotic features: Secondary | ICD-10-CM | POA: Diagnosis not present

## 2014-12-30 DIAGNOSIS — K5900 Constipation, unspecified: Secondary | ICD-10-CM | POA: Diagnosis not present

## 2014-12-30 DIAGNOSIS — H524 Presbyopia: Secondary | ICD-10-CM | POA: Insufficient documentation

## 2014-12-30 DIAGNOSIS — G3189 Other specified degenerative diseases of nervous system: Secondary | ICD-10-CM | POA: Insufficient documentation

## 2014-12-30 DIAGNOSIS — F1320 Sedative, hypnotic or anxiolytic dependence, uncomplicated: Secondary | ICD-10-CM | POA: Diagnosis not present

## 2014-12-30 DIAGNOSIS — Z22322 Carrier or suspected carrier of Methicillin resistant Staphylococcus aureus: Secondary | ICD-10-CM | POA: Insufficient documentation

## 2014-12-30 DIAGNOSIS — F419 Anxiety disorder, unspecified: Secondary | ICD-10-CM | POA: Insufficient documentation

## 2014-12-30 DIAGNOSIS — R410 Disorientation, unspecified: Secondary | ICD-10-CM | POA: Diagnosis present

## 2014-12-30 DIAGNOSIS — Z6825 Body mass index (BMI) 25.0-25.9, adult: Secondary | ICD-10-CM | POA: Insufficient documentation

## 2014-12-30 DIAGNOSIS — F42 Obsessive-compulsive disorder: Secondary | ICD-10-CM | POA: Insufficient documentation

## 2014-12-30 DIAGNOSIS — Z9181 History of falling: Secondary | ICD-10-CM | POA: Diagnosis not present

## 2014-12-30 LAB — CBC
HCT: 44.9 % (ref 39.0–52.0)
Hemoglobin: 15.6 g/dL (ref 13.0–17.0)
MCH: 33.2 pg (ref 26.0–34.0)
MCHC: 34.7 g/dL (ref 30.0–36.0)
MCV: 95.5 fL (ref 78.0–100.0)
Platelets: 222 10*3/uL (ref 150–400)
RBC: 4.7 MIL/uL (ref 4.22–5.81)
RDW: 14.3 % (ref 11.5–15.5)
WBC: 8.3 10*3/uL (ref 4.0–10.5)

## 2014-12-30 LAB — COMPREHENSIVE METABOLIC PANEL
ALT: 15 U/L (ref 0–53)
AST: 21 U/L (ref 0–37)
Albumin: 4.8 g/dL (ref 3.5–5.2)
Alkaline Phosphatase: 97 U/L (ref 39–117)
Anion gap: 7 (ref 5–15)
BUN: 13 mg/dL (ref 6–23)
CO2: 28 mmol/L (ref 19–32)
Calcium: 9.3 mg/dL (ref 8.4–10.5)
Chloride: 105 mEq/L (ref 96–112)
Creatinine, Ser: 1.26 mg/dL (ref 0.50–1.35)
GFR calc Af Amer: 70 mL/min — ABNORMAL LOW (ref >90)
GFR calc non Af Amer: 60 mL/min — ABNORMAL LOW (ref >90)
Glucose, Bld: 114 mg/dL — ABNORMAL HIGH (ref 70–99)
Potassium: 4.2 mmol/L (ref 3.5–5.1)
Sodium: 140 mmol/L (ref 135–145)
Total Bilirubin: 0.5 mg/dL (ref 0.3–1.2)
Total Protein: 7.5 g/dL (ref 6.0–8.3)

## 2014-12-30 LAB — URINALYSIS, ROUTINE W REFLEX MICROSCOPIC
Bilirubin Urine: NEGATIVE
Glucose, UA: NEGATIVE mg/dL
Hgb urine dipstick: NEGATIVE
Ketones, ur: NEGATIVE mg/dL
Leukocytes, UA: NEGATIVE
Nitrite: NEGATIVE
Protein, ur: NEGATIVE mg/dL
Specific Gravity, Urine: 1.02 (ref 1.005–1.030)
Urobilinogen, UA: 0.2 mg/dL (ref 0.0–1.0)
pH: 6 (ref 5.0–8.0)

## 2014-12-30 LAB — PHENYTOIN LEVEL, TOTAL: Phenytoin Lvl: 13.8 ug/mL (ref 10.0–20.0)

## 2014-12-30 MED ORDER — SODIUM CHLORIDE 0.9 % IV BOLUS (SEPSIS)
1000.0000 mL | INTRAVENOUS | Status: AC
  Administered 2014-12-30: 1000 mL via INTRAVENOUS

## 2014-12-30 NOTE — ED Notes (Signed)
EKG given to EDP, Wofford,MD., for review. 

## 2014-12-30 NOTE — ED Notes (Signed)
Pt here because friend insisted he come. Pt is poor historian at this time, unable to answer simple questions and gets life events confused, per friend. Friend is also poor historian as she doesn't know too many details of his PMH or care. Pt oriented to self. Pt sts year is 2015 and it's Tuesday. Pt friend concerned that pt is not taking his medication scorrectly and that they have struggled with that in the past.

## 2014-12-30 NOTE — ED Notes (Signed)
Pt unable to void at this time. 

## 2014-12-30 NOTE — ED Notes (Addendum)
Unsuccessful lab draw attempt x 2 R AC

## 2014-12-30 NOTE — ED Provider Notes (Signed)
CSN: [id number]     Arrival date & time 12/30/14  1637 History   First MD Initiated Contact with Patient 12/30/14 1910     Chief Complaint  Patient presents with  . Seizures  . Medical Clearance     (Consider location/radiation/quality/duration/timing/severity/associated sxs/prior Treatment) Patient is a 59 y.o. male presenting with seizures and neurologic complaint. The history is provided by the patient.  Seizures Neurologic Problem This is a recurrent problem. The current episode started 2 days ago. The problem occurs constantly. The problem has not changed since onset.Pertinent negatives include no chest pain, no abdominal pain, no headaches and no shortness of breath. Nothing aggravates the symptoms. Nothing relieves the symptoms. He has tried nothing for the symptoms. The treatment provided no relief.    Past Medical History  Diagnosis Date  . Seizures   . Hypertension   . GERD (gastroesophageal reflux disease)   . Dyslipidemia   . Schizoaffective disorder   . Depression   . Anxiety   . MRSA carrier    Past Surgical History  Procedure Laterality Date  . Self orchectomy    . Colonoscopy  2010  . Orif shoulder fracture    . Shoulder closed reduction Left 09/16/2013    Procedure: CLOSED REDUCTION SHOULDER;  Surgeon: Mauri Pole, MD;  Location: [facility];  Service: Orthopedics;  Laterality: Left;  . Shoulder hemi-arthroplasty Left 09/18/2013    Procedure: LEFT SHOULDER HEMI-ARTHROPLASTY;  Surgeon: Augustin Schooling, MD;  Location: [place];  Service: Orthopedics;  Laterality: Left;  . Carpal tunnel release     Family History  Problem Relation Age of Onset  . Stroke Father     Living at 55  . Stroke Mother     Stroke in late 63's. Died in her 53's   History  Substance Use Topics  . Smoking status: Never Smoker   . Smokeless tobacco: Never Used  . Alcohol Use: No    Review of Systems  Constitutional: Negative for fever.  HENT: Negative for drooling and rhinorrhea.    Eyes: Negative for pain.  Respiratory: Negative for cough and shortness of breath.   Cardiovascular: Negative for chest pain and leg swelling.  Gastrointestinal: Negative for nausea, vomiting, abdominal pain and diarrhea.  Genitourinary: Negative for dysuria and hematuria.  Musculoskeletal: Negative for gait problem and neck pain.  Skin: Negative for color change.  Neurological: Positive for seizures. Negative for numbness and headaches.       Confusion, falls  Hematological: Negative for adenopathy.  Psychiatric/Behavioral: Negative for behavioral problems.  All other systems reviewed and are negative.     Allergies  Codeine and Sulfa antibiotics  Home Medications   Prior to Admission medications   Medication Sig Start Date End Date Taking? Authorizing Provider  lacosamide (VIMPAT) 50 MG TABS tablet One tablet in the morning and 2 tablets in the evening 12/28/14  Yes Kathrynn Ducking, MD  phenytoin (DILANTIN) 50 MG tablet Chew 4 tablets (200 mg total) by mouth at bedtime. 11/29/14  Yes Frazier Richards, MD  QUEtiapine (SEROQUEL) 300 MG tablet Take 3 tablets (900 mg total) by mouth at bedtime. Takes 3 tabs QHS 11/29/14  Yes Frazier Richards, MD  ramelteon (ROZEREM) 8 MG tablet Take 1 tablet (8 mg total) by mouth at bedtime. 05/19/14  Yes Melony Overly, MD  simvastatin (ZOCOR) 40 MG tablet Take 1 tablet (40 mg total) by mouth at bedtime. 11/29/14  Yes Frazier Richards, MD  amitriptyline (ELAVIL) 150 MG  tablet Take 2 tablets (300 mg total) by mouth at bedtime. 11/29/14   Frazier Richards, MD  aspirin EC 81 MG tablet Take 81 mg by mouth daily.    Historical Provider, MD  clonazePAM (KLONOPIN) 2 MG tablet Take 1 tablet (2 mg total) by mouth 4 (four) times daily. 11/29/14   Frazier Richards, MD  metoprolol succinate (TOPROL-XL) 50 MG 24 hr tablet Take 1 tablet (50 mg total) by mouth at bedtime. Take with or immediately following a meal. 10/24/14   Lorna Few, DO  Multiple Vitamins tablet Take 1 tablet  by mouth daily.    Historical Provider, MD  omeprazole (PRILOSEC) 20 MG capsule Take 1 capsule (20 mg total) by mouth daily as needed. For heartburn or acid reflux. Patient not taking: Reported on 12/30/2014 05/19/14   Melony Overly, MD  polyethylene glycol [facility] / Floria Raveling) packet Take 17 g by mouth daily as needed. For constipation. Mix into 8 ounces of fluid & drink Patient not taking: Reported on 12/30/2014 10/21/14   Leeanne Rio, MD  testosterone cypionate (DEPOTESTOTERONE CYPIONATE) 200 MG/ML injection Inject 2 mLs (400 mg total) into the muscle every 14 (fourteen) days. 05/17/14   Nena Polio, PA-C   BP 113/70 mmHg  Pulse 94  Temp(Src) 98.2 F (36.8 C) (Oral)  Resp 18  SpO2 100% Physical Exam  Constitutional: He is oriented to person, place, and time. He appears well-developed and well-nourished.  HENT:  Right Ear: External ear normal.  Left Ear: External ear normal.  Nose: Nose normal.  Mouth/Throat: Oropharynx is clear and moist. No oropharyngeal exudate.   Mild abrasion to the lateral aspect of the right eyebrow.  Eyes: Conjunctivae and EOM are normal. Pupils are equal, round, and reactive to light.  Neck: Normal range of motion. Neck supple.  Cardiovascular: Normal rate, regular rhythm, normal heart sounds and intact distal pulses.  Exam reveals no gallop and no friction rub.   No murmur heard. Pulmonary/Chest: Effort normal and breath sounds normal. No respiratory distress. He has no wheezes.  Abdominal: Soft. Bowel sounds are normal. He exhibits no distension. There is no tenderness. There is no rebound and no guarding.  Musculoskeletal: Normal range of motion. He exhibits no edema or tenderness.  Neurological: He is alert and oriented to person, place, and time.  alert, oriented x2, he does think he is in the [facility] family practice clinic speech: normal in context and clarity memory: intact grossly cranial nerves II-XII: intact motor strength: full proximally and  distally no involuntary movements or tremors sensation: intact to light touch diffusely  cerebellar: finger-to-nose and heel-to-shin intact gait: able to walk forwards and backwards, some unsteadiness  Skin: Skin is warm and dry.  Psychiatric: He has a normal mood and affect. His behavior is normal.  Nursing note and vitals reviewed.   ED Course  Procedures (including critical care time) Labs Review Labs Reviewed  COMPREHENSIVE METABOLIC PANEL - Abnormal; Notable for the following:    Glucose, Bld 114 (*)    GFR calc non Af Amer 60 (*)    GFR calc Af Amer 70 (*)    All other components within normal limits  CREATININE, SERUM - Abnormal; Notable for the following:    GFR calc non Af Amer 63 (*)    GFR calc Af Amer 73 (*)    All other components within normal limits  MRSA PCR SCREENING  CBC  URINALYSIS, ROUTINE W REFLEX MICROSCOPIC  PHENYTOIN LEVEL, TOTAL  CBC  Imaging Review Ct Head Wo Contrast  12/30/2014   CLINICAL DATA:  Multiple seizures.  Confusion.  New memory loss.  EXAM: CT HEAD WITHOUT CONTRAST  TECHNIQUE: Contiguous axial images were obtained from the base of the skull through the vertex without intravenous contrast.  COMPARISON:  11/18/2014; 10/20/2014  FINDINGS: Similar findings of atrophy with mild diffuse sulcal prominence and prominence of the bifrontal extra-axial spaces. The gray-white differentiation is maintained. No CT evidence of acute large territory infarct. No intraparenchymal or extra-axial mass or hemorrhage. Unchanged size and configuration of the ventricles and basilar cisterns. No midline shift. Limited visualization of the paranasal sinuses demonstrates minimal mucosal thickening within the right sphenoid sinus. The remaining paranasal sinuses and mastoid air cells are normally aerated. No air-fluid levels. Regional soft tissues appear normal.  IMPRESSION: Similar findings of mild atrophy without acute intracranial process.   Electronically Signed   By:  Sandi Mariscal M.D.   On: 12/30/2014 21:27     EKG Interpretation   Date/Time:  Thursday December 30 2014 19:45:55 EST Ventricular Rate:  77 PR Interval:  145 QRS Duration: 122 QT Interval:  420 QTC Calculation: 475 R Axis:   -25 Text Interpretation:  Sinus rhythm Nonspecific intraventricular conduction  delay Borderline T abnormalities, inferior leads non-spec t wave changes  in V4-V6 Confirmed by Detric Scalisi  MD, Chauntae Hults ([id number]) on 12/30/2014 8:30:48 PM      MDM   Final diagnoses:  Confusion  Seizures    8:03 PM 59 y.o. male w hx of seizures on dilantin/vimpat, HTN  Who presents with worsening confusion over the last few days. I am familiar with the patient and admitted him last month. He saw his neurologist 2 days ago and had a seizure in the office. He also had a Dilantin level drawn at that time which are reviewed. His friend noticed that he  Had some confused responses when she spoke to him on the telephone around 11 AM today. When she saw him this afternoon he seemed off balance and tangential. She reports that he fell in the bathroom and hit his head. He believes he may have had a seizure. He reports that he is taking his medications as prescribed. He denies any pain on exam currently. He is afebrile and vital signs are unremarkable here.   I discussed the case with Dr. Leonel Ramsay with neurology he did not recommend adjusting his seizure medications as they were just adjusted 2 days ago. The pt continues to have mild confusion and be slightly unsteady in his gait. I felt like he needed admission for observation. Will admit to family practice.  Pamella Pert, MD 12/31/14 [phone]
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
## 2014-12-30 NOTE — ED Notes (Signed)
Pt. Made aware for the need of urine. 

## 2014-12-30 NOTE — Progress Notes (Signed)
CSW attempted to speak with patient.  However, nurse was with patient.  Willette Brace [phone] ED CSW 12/30/2014 9:39 PM

## 2014-12-31 ENCOUNTER — Other Ambulatory Visit: Payer: Self-pay | Admitting: Family Medicine

## 2014-12-31 ENCOUNTER — Observation Stay (HOSPITAL_COMMUNITY): Payer: Medicaid Other

## 2014-12-31 DIAGNOSIS — F251 Schizoaffective disorder, depressive type: Secondary | ICD-10-CM

## 2014-12-31 DIAGNOSIS — I951 Orthostatic hypotension: Secondary | ICD-10-CM | POA: Insufficient documentation

## 2014-12-31 DIAGNOSIS — R410 Disorientation, unspecified: Secondary | ICD-10-CM | POA: Diagnosis present

## 2014-12-31 DIAGNOSIS — R400 Somnolence: Secondary | ICD-10-CM

## 2014-12-31 DIAGNOSIS — R569 Unspecified convulsions: Secondary | ICD-10-CM

## 2014-12-31 DIAGNOSIS — I10 Essential (primary) hypertension: Secondary | ICD-10-CM

## 2014-12-31 DIAGNOSIS — E785 Hyperlipidemia, unspecified: Secondary | ICD-10-CM

## 2014-12-31 LAB — CBC
HCT: 41.9 % (ref 39.0–52.0)
Hemoglobin: 14.7 g/dL (ref 13.0–17.0)
MCH: 32.9 pg (ref 26.0–34.0)
MCHC: 35.1 g/dL (ref 30.0–36.0)
MCV: 93.7 fL (ref 78.0–100.0)
Platelets: 192 10*3/uL (ref 150–400)
RBC: 4.47 MIL/uL (ref 4.22–5.81)
RDW: 14.5 % (ref 11.5–15.5)
WBC: 7 10*3/uL (ref 4.0–10.5)

## 2014-12-31 LAB — CREATININE, SERUM
Creatinine, Ser: 1.22 mg/dL (ref 0.50–1.35)
GFR calc Af Amer: 73 mL/min — ABNORMAL LOW (ref >90)
GFR calc non Af Amer: 63 mL/min — ABNORMAL LOW (ref >90)

## 2014-12-31 LAB — MRSA PCR SCREENING: MRSA by PCR: NEGATIVE

## 2014-12-31 LAB — SPECIMEN STATUS REPORT

## 2014-12-31 MED ORDER — LACOSAMIDE 50 MG PO TABS: 100.0000 mg | ORAL_TABLET | Freq: Every evening | ORAL | Status: DC

## 2014-12-31 MED ORDER — ASPIRIN EC 81 MG PO TBEC
81.0000 mg | DELAYED_RELEASE_TABLET | Freq: Every day | ORAL | Status: DC
  Administered 2014-12-31: 81 mg via ORAL
  Filled 2014-12-31: qty 1

## 2014-12-31 MED ORDER — POLYETHYLENE GLYCOL 3350 17 G PO PACK
17.0000 g | PACK | Freq: Three times a day (TID) | ORAL | Status: DC
  Administered 2014-12-31: 17 g via ORAL
  Filled 2014-12-31 (×3): qty 1

## 2014-12-31 MED ORDER — CETYLPYRIDINIUM CHLORIDE 0.05 % MT LIQD
7.0000 mL | Freq: Two times a day (BID) | OROMUCOSAL | Status: DC
  Administered 2014-12-31: 7 mL via OROMUCOSAL

## 2014-12-31 MED ORDER — QUETIAPINE FUMARATE 300 MG PO TABS
300.0000 mg | ORAL_TABLET | Freq: Three times a day (TID) | ORAL | Status: DC
  Administered 2014-12-31 (×2): 300 mg via ORAL
  Filled 2014-12-31 (×3): qty 1

## 2014-12-31 MED ORDER — SENNOSIDES-DOCUSATE SODIUM 8.6-50 MG PO TABS
2.0000 | ORAL_TABLET | Freq: Once | ORAL | Status: AC
  Administered 2014-12-31: 2 via ORAL
  Filled 2014-12-31: qty 2

## 2014-12-31 MED ORDER — CLONAZEPAM 0.5 MG PO TABS: 1.0000 mg | ORAL_TABLET | Freq: Four times a day (QID) | ORAL | Status: DC

## 2014-12-31 MED ORDER — PHENYTOIN 50 MG PO CHEW
200.0000 mg | CHEWABLE_TABLET | Freq: Every day | ORAL | Status: DC
  Filled 2014-12-31: qty 4

## 2014-12-31 MED ORDER — LACOSAMIDE 50 MG PO TABS
50.0000 mg | ORAL_TABLET | Freq: Every morning | ORAL | Status: DC
  Administered 2014-12-31: 50 mg via ORAL
  Filled 2014-12-31: qty 1

## 2014-12-31 MED ORDER — HEPARIN SODIUM (PORCINE) 5000 UNIT/ML IJ SOLN
5000.0000 [IU] | Freq: Three times a day (TID) | INTRAMUSCULAR | Status: DC
  Administered 2014-12-31: 5000 [IU] via SUBCUTANEOUS
  Filled 2014-12-31 (×3): qty 1

## 2014-12-31 MED ORDER — CLONAZEPAM 0.5 MG PO TABS: 2.0000 mg | ORAL_TABLET | Freq: Four times a day (QID) | ORAL | Status: DC

## 2014-12-31 MED ORDER — DOCUSATE SODIUM 100 MG PO CAPS
100.0000 mg | ORAL_CAPSULE | Freq: Two times a day (BID) | ORAL | Status: DC
  Filled 2014-12-31: qty 1

## 2014-12-31 MED ORDER — METOPROLOL SUCCINATE ER 50 MG PO TB24
50.0000 mg | ORAL_TABLET | Freq: Every day | ORAL | Status: DC
  Filled 2014-12-31: qty 1

## 2014-12-31 MED ORDER — SIMVASTATIN 40 MG PO TABS
40.0000 mg | ORAL_TABLET | Freq: Every day | ORAL | Status: DC
  Filled 2014-12-31: qty 1

## 2014-12-31 MED ORDER — DEXTROSE-NACL 5-0.45 % IV SOLN
INTRAVENOUS | Status: DC
  Administered 2014-12-31: 1 mL via INTRAVENOUS
  Administered 2014-12-31: 04:00:00 via INTRAVENOUS

## 2014-12-31 MED ORDER — CLONAZEPAM 0.5 MG PO TABS
2.0000 mg | ORAL_TABLET | Freq: Four times a day (QID) | ORAL | Status: DC
  Administered 2014-12-31 (×2): 2 mg via ORAL
  Filled 2014-12-31 (×2): qty 4

## 2014-12-31 MED ORDER — DOCUSATE SODIUM 100 MG PO CAPS: 100.0000 mg | ORAL_CAPSULE | Freq: Two times a day (BID) | ORAL | Status: DC

## 2014-12-31 MED ORDER — POLYETHYLENE GLYCOL 3350 17 G PO PACK: 17.0000 g | PACK | Freq: Two times a day (BID) | ORAL | Status: DC | PRN

## 2014-12-31 NOTE — Progress Notes (Signed)
Pt arrived to floor. Alert to self, pt states he is in the hospital, but is asking nurse why he is here. VSS, pt placed on bed alarm and oriented to room. Call bell within reach. Suction set up in room. And admitting MD paged. Will continue to monitor. Ronnette Hila, RN

## 2014-12-31 NOTE — Progress Notes (Signed)
D/C'd IV; D/C'd telemetry.  Sitter at bedside.  Pt has decided that he wants to leave AMA.  Paperwork in chart.

## 2014-12-31 NOTE — Evaluation (Signed)
Physical Therapy Evaluation Patient Details Name: [name] MRN: [id number] DOB: 03-27-1956 Today's Date: 12/31/2014   History of Present Illness  [name] is a 59 y.o. male presenting with altered mental status and confusion. PMH is significant for schizoaffective disorder, HLD, HTN, seizure disorder, and androgen deficiency.  Clinical Impression  Patient seen for mobility evaluation QA:[id number] history of falls. Patient assessed for strength and conditioning, patient demonstrates no significant functional deficits.  When entering room patient was found on the floor on knees and forearms, when asked if he fell, patient states 'No, I got down here because I thought it would help my cramps".  I asked the patient was he hurt and could he get up by himself, patient immediately stood right up on his own without aany cues of physical assist and then demonstrated how he got to the floor, he then got up again and walked over to the bed and sat down independently. This PT did a full review of strength and assessed for injury or pain. Patient reported the only pain he had was his abdominal pain. Patient demonstrates full strength all ROM in bilateral LEs.  Patient ambulated in hall with RW and no physical assist. No evidence of gait dysfunction or weakness limiting mobility. Ambulated in independently without device.  OF NOTE: during session patient was extremely paranoid regarding his belongings, when inquiring about friends or supervision available at home, patient states he has no friends and that he is so depressed that he just lays in bed and never gets out.  Patient also stated that he has several tanks of 'helium' at home so that if things don't work out he can just "off himself".  Nursing notified.  Patient also very insistent of having requests for various laxatives to assist with his BMs citing he has been in the hospital so over 4 times for bowel obstructions. Spoke with patient at length regarding  mobility, history of falls, available assist, etc.  Very concerned regarding responses, patient may benefit from psych consult while inpatient. Will trial PT secondary to reported history of falls. VSS throughout session.    Follow Up Recommendations Supervision/Assistance - 24 hour    Equipment Recommendations  None recommended by PT    Recommendations for Other Services Other (comment) (may benefit from pysch consult )     Precautions / Restrictions Precautions Precautions: Fall      Mobility  Bed Mobility Overal bed mobility: Independent                Transfers Overall transfer level: Independent Equipment used: Rolling walker (2 wheeled);None             General transfer comment: performed with and without assistive device (RW)  Ambulation/Gait Ambulation/Gait assistance: Supervision Ambulation Distance (Feet): 310 Feet Assistive device: Rolling walker (2 wheeled) Gait Pattern/deviations: Narrow base of support Gait velocity: modestly decreased Gait velocity interpretation: Below normal speed for age/gender General Gait Details: Supervision for safety secondary to questionable cognition, no physical need for assist  Stairs            Wheelchair Mobility    Modified Rankin (Stroke Patients Only)       Balance Overall balance assessment: History of Falls                                           Pertinent Vitals/Pain Pain Assessment: Faces  Faces Pain Scale: Hurts even more Pain Location: abdominal Pain Descriptors / Indicators: Cramping Pain Intervention(s): Monitored during session;Limited activity within patient's tolerance;Repositioned    Home Living Family/patient expects to be discharged to:: Private residence Living Arrangements: Alone Available Help at Discharge: Other (Comment) (states he has bad depression and no friends) Type of Home: Apartment Home Access: Level entry     Home Layout: One level Home  Equipment: None Additional Comments: pt confused and may be questionable historian, conflicting information being provided during questioning    Prior Function Level of Independence: Independent         Comments: Hx of seizures and schizoaffective disorder; cannot drive.  States that he does not get out of bed at all during the day.       Hand Dominance   Dominant Hand: Left    Extremity/Trunk Assessment   Upper Extremity Assessment: Overall WFL for tasks assessed           Lower Extremity Assessment: Overall WFL for tasks assessed (symetrical at 4+/5 strength upon assessment)         Communication   Communication: No difficulties  Cognition Arousal/Alertness: Awake/alert Behavior During Therapy: Anxious;Agitated;Impulsive Overall Cognitive Status: History of cognitive impairments - at baseline                      General Comments      Exercises        Assessment/Plan    PT Assessment Patient needs continued PT services  PT Diagnosis Altered mental status   PT Problem List Decreased mobility  PT Treatment Interventions Gait training;Functional mobility training;Therapeutic activities;Therapeutic exercise;Cognitive remediation   PT Goals (Current goals can be found in the Care Plan section) Acute Rehab PT Goals Patient Stated Goal: to go home and lay in bed PT Goal Formulation: With patient Time For Goal Achievement: 01/14/15 Potential to Achieve Goals: Good    Frequency Min 3X/week   Barriers to discharge Decreased caregiver support      Co-evaluation               End of Session Equipment Utilized During Treatment: Gait belt Activity Tolerance: Patient tolerated treatment well Patient left: in bed;with call bell/phone within reach;with bed alarm set Nurse Communication: Mobility status;Precautions (recommend camera room)         Time: 8144-8185 PT Time Calculation (min) (ACUTE ONLY): 27 min   Charges:   PT  Evaluation $Initial PT Evaluation Tier I: 1 Procedure PT Treatments $Gait Training: 8-22 mins $Self Care/Home Management: 8-22   PT G CodesDuncan Dull 01/08/15, 11:26 AM Alben Deeds, PT DPT  [phone]

## 2014-12-31 NOTE — Progress Notes (Signed)
UR completed 

## 2014-12-31 NOTE — Progress Notes (Signed)
Pt refused soap suds enema, as he has had 1 bowel movement today, Miralax has been succussful.

## 2014-12-31 NOTE — Care Management Note (Signed)
    Page 1 of 1   12/31/2014     12:31:58 PM CARE MANAGEMENT NOTE 12/31/2014  Patient:  [name], [name]   Account Number:  [id number]  Date Initiated:  12/31/2014  Documentation initiated by:  Mariann Laster  Subjective/Objective Assessment:   Seizures, AMS with confusion, Falls, Poor social support  Hx/o Seizure disorder, schizoaffective Disorder     Action/Plan:   CM to follow for disposition needs   Anticipated DC Date:  01/03/2015   Anticipated DC Plan:  HOME/SELF CARE         Choice offered to / List presented to:             Status of service:  Completed, signed off Medicare Important Message given?  NO (If response is "NO", the following Medicare IM given date fields will be blank) Date Medicare IM given:   Medicare IM given by:   Date Additional Medicare IM given:   Additional Medicare IM given by:    Discharge Disposition:    Per UR Regulation:  Reviewed for med. necessity/level of care/duration of stay  If discussed at [place] of Stay Meetings, dates discussed:    Comments:  Senetra Dillin RN, BSN, MSHL, CCM  Nurse - Case Manager,  ([facility]) [phone]  12/31/2014 Social:  Home alone.   Possible hx/o of poly pharmacy and adherence to tx plan. Hx/o active with  [place]  home visits but patient recently "fired" agency. Disposition Plan:  pending.

## 2014-12-31 NOTE — Progress Notes (Signed)
Unable to print AVS, as pt is leaving AMA, and med rec has not been completed by physician.

## 2014-12-31 NOTE — Progress Notes (Signed)
Family Practice Teaching Service Interval Progress Note  Received page from RN that pt threatening to leave against medical advice. Went to speak with patient. He was lying calmly in bed. We discussed his reasons for wanting to go home. He states he has been in the hospital three times now for seizures and falls and nothing has happened. Encouraged him to stay in hospital to regulate his med list and have psychiatry input regarding side effect profile given his frequent falls, dry mouth, and persistent seizures. Pt states he still would like to go home.  We then discussed his mentioning of possible suicidality this morning. He had told me this AM that he has a helium tank in his home, and has researched how he might use it to kill himself. We thus consulted psychiatry and ordered a sitter today. Now, when I talk to Mr. Schewe he states very clearly that he does NOT have thoughts of harming himself at present. He has no plan or thought to go home and harm himself. He states he will get rid of the helium tank, and that he has had this for a long time since before his parents died, when he was very depressed. He was able to verbalize that should he begin having thoughts or plans of hurting himself, he will call the doctor or go to the ER immediately. He contracted for safety and promised he would follow this plan to stay safe.  I thus do not believe there is a reason to involuntarily commit him at this time. He is of sound mind currently and able to justify appropriately his reasons for wanting to go home. He will follow up with me (his PCP) in the clinic, and will also schedule psychiatry follow up as an outpatient. I explained that medically, we continue to recommend he stay in the hospital and as such he will need to sign AMA paperwork if he leaves the hospital. He was agreeable to having to sign the paperwork.   Discussed with attending physician Dr. Ree Kida by phone, who agrees with allowing pt to leave  AMA. I did send in a prescription for colace and miralax for patient to use when he gets home, since he has been constipated here. Nursing staff updated on this development as well.  Chrisandra Netters, MD Family Medicine PGY-3 Service Pager [phone]

## 2014-12-31 NOTE — Progress Notes (Signed)
Orthostatic vital signs performed.  Lying- 107/76 HR 88 Sitting- 87/64 HR 92 Standing- 77/44 HR 98  Patient states he did not feel dizzy or light headed with sitting or standing, but states that usually if he sits up to fast and stands up to fast his blood pressure drops and he feels dizzy. Pt states that if he moves to quickly he gets dizzy. Patient instructed to call RN if needing to get out of bed. Bed alarm placed.

## 2014-12-31 NOTE — Progress Notes (Signed)
Pt states that he would like to go home.  I have notified physician's and they are aware.

## 2014-12-31 NOTE — H&P (Signed)
[facility] Admission History and Physical Service Pager: [phone]  Patient name: [name] Medical record number: [id number] Date of birth: 12-16-1956 Age: 59 y.o. Gender: male  Primary Care Provider: Chrisandra Netters, MD Consultants: None Code Status: Full  Chief Complaint: Confusion and falls  Assessment and Plan: [name] is a 59 y.o. male presenting with altered mental status and confusion. PMH is significant for schizoaffective disorder, HLD, HTN, seizure disorder, and androgen deficiency.  Altered Mental status and reported frequent falls- Increased confusion possibly due to post-ictal effects of recurrent seizures vs polypharmacy (patient with high doses of sedatives). Questionable adherence to home seizure regimen, though phenytoin level in target range. Head CT negative. Labs and UA unremarkable for any signs of infection contributing to AMS. Neurology called while patient was in the ED and recommended to keep patient on current seizure medication regimen as it was just adjusted 3 days ago. -Admit to telemetry under Dr. Mingo Amber  -Continue Vimpat and phenytoin.   -Seizure and fall precautions -Neurology consult in AM -PT/OT recs -Orthostatics -repeat neuro exam in AM -monitor mental status  Seizure disorder. Is on phenytoin 200mg  QHS and vimpat 50mg  (1 tab in AM, 2 in pm) at home. Patient seen by neurology on 1/12. Per neuro note hasn't been seen since 2009.  - continue medications as prescribed; will not adjust at this time - Recent Phenytoin level therapeutic.   Poor Social Support. Patient lives alone with no social support and likely not taking medications at home properly. -Case management consult for home health needs/assistance.   Schizoaffective Disorder. Home medications include: amitriptyline, klonopin, seroquel, ramelteon. -Adjusted after discussion with pharmacy given AMS: klonipin 1 mg QID, seroquel 300 mg TID -holding  amitriptyline and ramelteon while patient clears.  HTN. At goal - Continue home metoprolol  HLD. - Continue home statin  Androgen Deficiency: acquired unsure of history. Prior notes state auto-castration. - Testosterone injection every 2 weeks - resume on discharge  FEN/GI: Heart healthy Diet/D5 1/2 NS @100mL  Prophylaxis: hep SQ  Disposition: Admit for observation  History of Present Illness: [name] is a 59 y.o. male presenting with altered mental status and confusion. PMH is significant for schizoaffective disorder, HLD, HTN, seizure disorder, and androgen deficiency. History of this patient is limited by altered mental status and confusion. Patient states that he has been confused and having mores seizures. He just went to see the neurologist a few days ago and his seizure medication was adjusted then. He said his last seizure was at the neurologist office on 1/12. Patient endorses associated blurred vision and unsteadiness leading to falls. Patient is unaware of how he got to [facility] ED. He states that he stays at home by hisself with no help. Patient endorses taking all medication as prescribed. He is adjusting to the new AM Vimpat dose because he usually just takes all his medicines at night.   ED provider states that a friend brought patient to ED due to concern imbalance issues and confusion. She reports that he fell in the bathroom and hit his head. Questionable seizure episode. ED provider was concerned about this patient's worsening confusion and falls so wanted patient admitted for observation and better social support.  Review Of Systems: Per HPI. Otherwise 12 point review of systems was performed and was unremarkable.  Patient Active Problem List   Diagnosis Date Noted  . Confusion 12/30/2014  . Wheezing 11/29/2014  . Wheezes   . Falls   . UTI (  lower urinary tract infection)   . HLD (hyperlipidemia)   . Seizure 11/18/2014  . Schizoaffective disorder, unspecified type    . Essential hypertension   . Overdose of beta-adrenergic antagonist drug 10/22/2014  . Acute respiratory failure, unspecified whether with hypoxia or hypercapnia   . Acute respiratory failure 10/20/2014  . Bradycardia, sinus   . Altered mental status   . Overdose   . Nocturnal enuresis 03/23/2014  . Benzodiazepine dependence, continuous 01/27/2014  . Opiate dependence 01/22/2014  . Severe major depression 01/21/2014  . Frozen shoulder syndrome 09/20/2013  . Somnolence 09/20/2013  . Schizoaffective disorder 09/20/2013  . Malaise and fatigue 04/21/2013  . Presbyopia 04/25/2012  . Androgen deficiency 09/07/2011  . Constipation, chronic 09/07/2011  . Other specified disorders of liver 12/30/2008  . Overweight 09/08/2008  . HYPERLIPIDEMIA 01/28/2008  . HYPERTENSION 01/28/2008  . Seizure disorder 01/28/2008  . OSTEOPENIA 07/01/2007   Past Medical History: Past Medical History  Diagnosis Date  . Seizures   . Hypertension   . GERD (gastroesophageal reflux disease)   . Dyslipidemia   . Schizoaffective disorder   . Depression   . Anxiety   . MRSA carrier    Past Surgical History: Past Surgical History  Procedure Laterality Date  . Self orchectomy    . Colonoscopy  2010  . Orif shoulder fracture    . Shoulder closed reduction Left 09/16/2013    Procedure: CLOSED REDUCTION SHOULDER;  Surgeon: Mauri Pole, MD;  Location: [facility];  Service: Orthopedics;  Laterality: Left;  . Shoulder hemi-arthroplasty Left 09/18/2013    Procedure: LEFT SHOULDER HEMI-ARTHROPLASTY;  Surgeon: Augustin Schooling, MD;  Location: [facility];  Service: Orthopedics;  Laterality: Left;  . Carpal tunnel release     Social History: History  Substance Use Topics  . Smoking status: Never Smoker   . Smokeless tobacco: Never Used  . Alcohol Use: No   Additional social history: Lives alone at home with no assistance. Please also refer to relevant sections of EMR.  Family History: Family History  Problem  Relation Age of Onset  . Stroke Father     Living at 53  . Stroke Mother     Stroke in late 21's. Died in her 68's   Allergies and Medications: Allergies  Allergen Reactions  . Codeine Nausea And Vomiting  . Sulfa Antibiotics Nausea And Vomiting   No current facility-administered medications on file prior to encounter.   Current Outpatient Prescriptions on File Prior to Encounter  Medication Sig Dispense Refill  . amitriptyline (ELAVIL) 150 MG tablet Take 2 tablets (300 mg total) by mouth at bedtime. (Patient taking differently: Take 350 mg by mouth at bedtime. ) 60 tablet 3  . aspirin EC 81 MG tablet Take 81 mg by mouth daily.    . clonazePAM (KLONOPIN) 2 MG tablet Take 1 tablet (2 mg total) by mouth 4 (four) times daily. 120 tablet 5  . lacosamide (VIMPAT) 50 MG TABS tablet One tablet in the morning and 2 tablets in the evening 90 tablet 3  . metoprolol succinate (TOPROL-XL) 50 MG 24 hr tablet Take 1 tablet (50 mg total) by mouth at bedtime. Take with or immediately following a meal. 30 tablet 0  . phenytoin (DILANTIN) 50 MG tablet Chew 4 tablets (200 mg total) by mouth at bedtime. 120 tablet 6  . QUEtiapine (SEROQUEL) 300 MG tablet Take 3 tablets (900 mg total) by mouth at bedtime. Takes 3 tabs QHS 90 tablet 6  . ramelteon (ROZEREM)  8 MG tablet Take 1 tablet (8 mg total) by mouth at bedtime. 30 tablet 2  . simvastatin (ZOCOR) 40 MG tablet Take 1 tablet (40 mg total) by mouth at bedtime. 30 tablet 6  . omeprazole (PRILOSEC) 20 MG capsule Take 1 capsule (20 mg total) by mouth daily as needed. For heartburn or acid reflux. (Patient not taking: Reported on 12/30/2014) 30 capsule 6  . polyethylene glycol (MIRALAX / GLYCOLAX) packet Take 17 g by mouth daily as needed. For constipation. Mix into 8 ounces of fluid & drink (Patient not taking: Reported on 12/30/2014) 30 each 6  . testosterone cypionate (DEPOTESTOTERONE CYPIONATE) 200 MG/ML injection Inject 2 mLs (400 mg total) into the muscle  every 14 (fourteen) days. 10 mL 0    Objective: BP 104/55 mmHg  Pulse 91  Temp(Src) 97.5 F (36.4 C) (Oral)  Resp 18  SpO2 96% Exam: General: Alert, confused, in NAD lying in hospital bed HEENT: dry mucous membranes, EOMI Cardiovascular: RRR, no murmurs appreciated Respiratory: NWOB, CTAB, no wheezes Abdomen: +BS, soft, NT, ND Extremities: No cyanosis, No edema, FROM Skin: No rashes, scattered bruises  Neuro: Alert and oriented x2. CN2-12 intact, Strength 5/5 in UE and LE. Sensation grossly intact with no gross deficits. Slowed speech but appropriate clarity. Psych: Slightly tangential, no suicidal or homicidal ideation, anxious  Labs and Imaging: Results for orders placed or performed during the hospital encounter of 12/30/14 (from the past 24 hour(s))  CBC     Status: None   Collection Time: 12/30/14  5:52 PM  Result Value Ref Range   WBC 8.3 4.0 - 10.5 K/uL   RBC 4.70 4.22 - 5.81 MIL/uL   Hemoglobin 15.6 13.0 - 17.0 g/dL   HCT 44.9 39.0 - 52.0 %   MCV 95.5 78.0 - 100.0 fL   MCH 33.2 26.0 - 34.0 pg   MCHC 34.7 30.0 - 36.0 g/dL   RDW 14.3 11.5 - 15.5 %   Platelets 222 150 - 400 K/uL  Comprehensive metabolic panel     Status: Abnormal   Collection Time: 12/30/14  5:52 PM  Result Value Ref Range   Sodium 140 135 - 145 mmol/L   Potassium 4.2 3.5 - 5.1 mmol/L   Chloride 105 96 - 112 mEq/L   CO2 28 19 - 32 mmol/L   Glucose, Bld 114 (H) 70 - 99 mg/dL   BUN 13 6 - 23 mg/dL   Creatinine, Ser 1.26 0.50 - 1.35 mg/dL   Calcium 9.3 8.4 - 10.5 mg/dL   Total Protein 7.5 6.0 - 8.3 g/dL   Albumin 4.8 3.5 - 5.2 g/dL   AST 21 0 - 37 U/L   ALT 15 0 - 53 U/L   Alkaline Phosphatase 97 39 - 117 U/L   Total Bilirubin 0.5 0.3 - 1.2 mg/dL   GFR calc non Af Amer 60 (L) >90 mL/min   GFR calc Af Amer 70 (L) >90 mL/min   Anion gap 7 5 - 15  Phenytoin level, total     Status: None   Collection Time: 12/30/14  8:30 PM  Result Value Ref Range   Phenytoin Lvl 13.8 10.0 - 20.0 ug/mL   Urinalysis, Routine w reflex microscopic     Status: None   Collection Time: 12/30/14 11:02 PM  Result Value Ref Range   Color, Urine YELLOW YELLOW   APPearance CLEAR CLEAR   Specific Gravity, Urine 1.020 1.005 - 1.030   pH 6.0 5.0 - 8.0   Glucose, UA  NEGATIVE NEGATIVE mg/dL   Hgb urine dipstick NEGATIVE NEGATIVE   Bilirubin Urine NEGATIVE NEGATIVE   Ketones, ur NEGATIVE NEGATIVE mg/dL   Protein, ur NEGATIVE NEGATIVE mg/dL   Urobilinogen, UA 0.2 0.0 - 1.0 mg/dL   Nitrite NEGATIVE NEGATIVE   Leukocytes, UA NEGATIVE NEGATIVE   Ct Head Wo Contrast  12/30/2014   CLINICAL DATA:  Multiple seizures.  Confusion.  New memory loss.  EXAM: CT HEAD WITHOUT CONTRAST  TECHNIQUE: Contiguous axial images were obtained from the base of the skull through the vertex without intravenous contrast.  COMPARISON:  11/18/2014; 10/20/2014  FINDINGS: Similar findings of atrophy with mild diffuse sulcal prominence and prominence of the bifrontal extra-axial spaces. The gray-white differentiation is maintained. No CT evidence of acute large territory infarct. No intraparenchymal or extra-axial mass or hemorrhage. Unchanged size and configuration of the ventricles and basilar cisterns. No midline shift. Limited visualization of the paranasal sinuses demonstrates minimal mucosal thickening within the right sphenoid sinus. The remaining paranasal sinuses and mastoid air cells are normally aerated. No air-fluid levels. Regional soft tissues appear normal.  IMPRESSION: Similar findings of mild atrophy without acute intracranial process.   Electronically Signed   By: Sandi Mariscal M.D.   On: 12/30/2014 21:27   Katheren Shams, DO 12/31/2014, 1:27 AM PGY-1, [place] Intern pager: [phone], text pages welcome  I have seen and evaluated the above patient.  I agree with the note. Addendum in blue.  [place] [facility] PGY-3 Pager: [phone]

## 2015-01-02 ENCOUNTER — Emergency Department (HOSPITAL_COMMUNITY): Payer: Medicaid Other

## 2015-01-02 ENCOUNTER — Inpatient Hospital Stay (HOSPITAL_COMMUNITY)
Admission: EM | Admit: 2015-01-02 | Discharge: 2015-01-06 | DRG: 871 | Disposition: A | Discharge status: Home / Self Care | Payer: Medicaid Other | Attending: Family Medicine | Admitting: Family Medicine

## 2015-01-02 ENCOUNTER — Encounter (HOSPITAL_COMMUNITY): Payer: Self-pay

## 2015-01-02 DIAGNOSIS — Z609 Problem related to social environment, unspecified: Secondary | ICD-10-CM | POA: Diagnosis present

## 2015-01-02 DIAGNOSIS — I10 Essential (primary) hypertension: Secondary | ICD-10-CM | POA: Diagnosis present

## 2015-01-02 DIAGNOSIS — G40909 Epilepsy, unspecified, not intractable, without status epilepticus: Secondary | ICD-10-CM | POA: Diagnosis present

## 2015-01-02 DIAGNOSIS — F419 Anxiety disorder, unspecified: Secondary | ICD-10-CM | POA: Diagnosis present

## 2015-01-02 DIAGNOSIS — R296 Repeated falls: Secondary | ICD-10-CM | POA: Diagnosis present

## 2015-01-02 DIAGNOSIS — Z7982 Long term (current) use of aspirin: Secondary | ICD-10-CM

## 2015-01-02 DIAGNOSIS — Z882 Allergy status to sulfonamides status: Secondary | ICD-10-CM

## 2015-01-02 DIAGNOSIS — K5900 Constipation, unspecified: Secondary | ICD-10-CM | POA: Diagnosis present

## 2015-01-02 DIAGNOSIS — Z6825 Body mass index (BMI) 25.0-25.9, adult: Secondary | ICD-10-CM | POA: Diagnosis not present

## 2015-01-02 DIAGNOSIS — R4182 Altered mental status, unspecified: Secondary | ICD-10-CM | POA: Diagnosis not present

## 2015-01-02 DIAGNOSIS — E785 Hyperlipidemia, unspecified: Secondary | ICD-10-CM | POA: Diagnosis present

## 2015-01-02 DIAGNOSIS — J189 Pneumonia, unspecified organism: Secondary | ICD-10-CM | POA: Diagnosis present

## 2015-01-02 DIAGNOSIS — F332 Major depressive disorder, recurrent severe without psychotic features: Secondary | ICD-10-CM | POA: Diagnosis present

## 2015-01-02 DIAGNOSIS — K219 Gastro-esophageal reflux disease without esophagitis: Secondary | ICD-10-CM | POA: Diagnosis present

## 2015-01-02 DIAGNOSIS — Z886 Allergy status to analgesic agent status: Secondary | ICD-10-CM

## 2015-01-02 DIAGNOSIS — F1120 Opioid dependence, uncomplicated: Secondary | ICD-10-CM | POA: Diagnosis present

## 2015-01-02 DIAGNOSIS — E663 Overweight: Secondary | ICD-10-CM | POA: Diagnosis present

## 2015-01-02 DIAGNOSIS — A419 Sepsis, unspecified organism: Secondary | ICD-10-CM | POA: Diagnosis present

## 2015-01-02 DIAGNOSIS — F251 Schizoaffective disorder, depressive type: Secondary | ICD-10-CM | POA: Diagnosis present

## 2015-01-02 DIAGNOSIS — F429 Obsessive-compulsive disorder, unspecified: Secondary | ICD-10-CM | POA: Insufficient documentation

## 2015-01-02 DIAGNOSIS — Y95 Nosocomial condition: Secondary | ICD-10-CM | POA: Diagnosis present

## 2015-01-02 DIAGNOSIS — R32 Unspecified urinary incontinence: Secondary | ICD-10-CM | POA: Diagnosis present

## 2015-01-02 DIAGNOSIS — IMO0001 Reserved for inherently not codable concepts without codable children: Secondary | ICD-10-CM | POA: Insufficient documentation

## 2015-01-02 LAB — URINALYSIS, ROUTINE W REFLEX MICROSCOPIC
Bilirubin Urine: NEGATIVE
Glucose, UA: 100 mg/dL — AB
Hgb urine dipstick: NEGATIVE
Ketones, ur: NEGATIVE mg/dL
Leukocytes, UA: NEGATIVE
Nitrite: NEGATIVE
Protein, ur: NEGATIVE mg/dL
Specific Gravity, Urine: 1.022 (ref 1.005–1.030)
Urobilinogen, UA: 0.2 mg/dL (ref 0.0–1.0)
pH: 6.5 (ref 5.0–8.0)

## 2015-01-02 LAB — COMPREHENSIVE METABOLIC PANEL
ALT: 14 U/L (ref 0–53)
AST: 22 U/L (ref 0–37)
Albumin: 4 g/dL (ref 3.5–5.2)
Alkaline Phosphatase: 101 U/L (ref 39–117)
Anion gap: 10 (ref 5–15)
BUN: 13 mg/dL (ref 6–23)
CO2: 25 mmol/L (ref 19–32)
Calcium: 9 mg/dL (ref 8.4–10.5)
Chloride: 104 mEq/L (ref 96–112)
Creatinine, Ser: 0.94 mg/dL (ref 0.50–1.35)
GFR calc Af Amer: >90 mL/min (ref >90)
GFR calc non Af Amer: 89 mL/min — ABNORMAL LOW (ref >90)
Glucose, Bld: 120 mg/dL — ABNORMAL HIGH (ref 70–99)
Potassium: 4.1 mmol/L (ref 3.5–5.1)
Sodium: 139 mmol/L (ref 135–145)
Total Bilirubin: 0.4 mg/dL (ref 0.3–1.2)
Total Protein: 7 g/dL (ref 6.0–8.3)

## 2015-01-02 LAB — CBC
HCT: 39.6 % (ref 39.0–52.0)
Hemoglobin: 13.8 g/dL (ref 13.0–17.0)
MCH: 33.3 pg (ref 26.0–34.0)
MCHC: 34.8 g/dL (ref 30.0–36.0)
MCV: 95.4 fL (ref 78.0–100.0)
Platelets: 184 10*3/uL (ref 150–400)
RBC: 4.15 MIL/uL — ABNORMAL LOW (ref 4.22–5.81)
RDW: 14.6 % (ref 11.5–15.5)
WBC: 16.6 10*3/uL — ABNORMAL HIGH (ref 4.0–10.5)

## 2015-01-02 LAB — DIFFERENTIAL
Basophils Absolute: 0 10*3/uL (ref 0.0–0.1)
Basophils Relative: 0 % (ref 0–1)
Eosinophils Absolute: 0 10*3/uL (ref 0.0–0.7)
Eosinophils Relative: 0 % (ref 0–5)
Lymphocytes Relative: 7 % — ABNORMAL LOW (ref 12–46)
Lymphs Abs: 1.1 10*3/uL (ref 0.7–4.0)
Monocytes Absolute: 0.6 10*3/uL (ref 0.1–1.0)
Monocytes Relative: 3 % (ref 3–12)
Neutro Abs: 14.9 10*3/uL — ABNORMAL HIGH (ref 1.7–7.7)
Neutrophils Relative %: 90 % — ABNORMAL HIGH (ref 43–77)

## 2015-01-02 LAB — I-STAT CG4 LACTIC ACID, ED: Lactic Acid, Venous: 3.11 mmol/L — ABNORMAL HIGH (ref 0.5–2.2)

## 2015-01-02 LAB — CBG MONITORING, ED: Glucose-Capillary: 111 mg/dL — ABNORMAL HIGH (ref 70–99)

## 2015-01-02 LAB — PHENYTOIN LEVEL, TOTAL: Phenytoin Lvl: 10.4 ug/mL (ref 10.0–20.0)

## 2015-01-02 MED ORDER — SODIUM CHLORIDE 0.9 % IV BOLUS (SEPSIS)
1000.0000 mL | INTRAVENOUS | Status: AC
  Administered 2015-01-02 (×2): 1000 mL via INTRAVENOUS

## 2015-01-02 MED ORDER — IPRATROPIUM-ALBUTEROL 0.5-2.5 (3) MG/3ML IN SOLN
3.0000 mL | Freq: Once | RESPIRATORY_TRACT | Status: AC
  Administered 2015-01-02: 3 mL via RESPIRATORY_TRACT
  Filled 2015-01-02: qty 3

## 2015-01-02 MED ORDER — SODIUM CHLORIDE 0.9 % IV BOLUS (SEPSIS)
500.0000 mL | INTRAVENOUS | Status: AC
  Administered 2015-01-02: 500 mL via INTRAVENOUS

## 2015-01-02 MED ORDER — SODIUM CHLORIDE 0.9 % IV SOLN
INTRAVENOUS | Status: AC
Start: 2015-01-02 — End: 2015-01-03
  Administered 2015-01-02: 23:00:00 via INTRAVENOUS

## 2015-01-02 MED ORDER — VANCOMYCIN HCL IN DEXTROSE 1-5 GM/200ML-% IV SOLN
1000.0000 mg | Freq: Three times a day (TID) | INTRAVENOUS | Status: DC
  Administered 2015-01-03 – 2015-01-04 (×4): 1000 mg via INTRAVENOUS
  Filled 2015-01-02 (×6): qty 200

## 2015-01-02 MED ORDER — PIPERACILLIN-TAZOBACTAM 3.375 G IVPB 30 MIN
3.3750 g | Freq: Once | INTRAVENOUS | Status: AC
  Administered 2015-01-02: 3.375 g via INTRAVENOUS
  Filled 2015-01-02: qty 50

## 2015-01-02 MED ORDER — PIPERACILLIN-TAZOBACTAM 3.375 G IVPB
3.3750 g | Freq: Three times a day (TID) | INTRAVENOUS | Status: DC
  Administered 2015-01-03 – 2015-01-04 (×5): 3.375 g via INTRAVENOUS
  Filled 2015-01-02 (×6): qty 50

## 2015-01-02 MED ORDER — SODIUM CHLORIDE 0.9 % IV BOLUS (SEPSIS)
1000.0000 mL | Freq: Once | INTRAVENOUS | Status: AC
  Administered 2015-01-02: 1000 mL via INTRAVENOUS

## 2015-01-02 MED ORDER — VANCOMYCIN HCL IN DEXTROSE 1-5 GM/200ML-% IV SOLN
1000.0000 mg | Freq: Once | INTRAVENOUS | Status: AC
  Administered 2015-01-02: 1000 mg via INTRAVENOUS
  Filled 2015-01-02: qty 200

## 2015-01-02 MED ORDER — ACETAMINOPHEN 650 MG RE SUPP
650.0000 mg | Freq: Once | RECTAL | Status: AC
  Administered 2015-01-02: 650 mg via RECTAL
  Filled 2015-01-02: qty 1

## 2015-01-02 NOTE — ED Provider Notes (Signed)
CSN: [id number]     Arrival date & time 01/02/15  1804 History   First MD Initiated Contact with Patient 01/02/15 1819     Chief Complaint  Patient presents with  . Fever     (Consider location/radiation/quality/duration/timing/severity/associated sxs/prior Treatment) The history is provided by a significant other.     Pt w hx seizures presents with fever and confusion.  His significant other states that he was his normal self last night, went to sleep around 1am, slept until 5pm and she called EMS.  States pt was breathing strangely in his sleep.  He took all of his normal medications last night and she notes these usually make him sleepier than usual in the morning but this is abnormal.    Level V caveat for confusion, altered mental status, acuity of condition.   Past Medical History  Diagnosis Date  . Seizures   . Hypertension   . GERD (gastroesophageal reflux disease)   . Dyslipidemia   . Schizoaffective disorder   . Depression   . Anxiety   . MRSA carrier    Past Surgical History  Procedure Laterality Date  . Self orchectomy    . Colonoscopy  2010  . Orif shoulder fracture    . Shoulder closed reduction Left 09/16/2013    Procedure: CLOSED REDUCTION SHOULDER;  Surgeon: Mauri Pole, MD;  Location: [facility];  Service: Orthopedics;  Laterality: Left;  . Shoulder hemi-arthroplasty Left 09/18/2013    Procedure: LEFT SHOULDER HEMI-ARTHROPLASTY;  Surgeon: Augustin Schooling, MD;  Location: [place];  Service: Orthopedics;  Laterality: Left;  . Carpal tunnel release     Family History  Problem Relation Age of Onset  . Stroke Father     Living at 42  . Stroke Mother     Stroke in late 77's. Died in her 35's   History  Substance Use Topics  . Smoking status: Never Smoker   . Smokeless tobacco: Never Used  . Alcohol Use: No    Review of Systems  Unable to perform ROS: Acuity of condition      Allergies  Codeine and Sulfa antibiotics  Home Medications   Prior to  Admission medications   Medication Sig Start Date End Date Taking? Authorizing Provider  amitriptyline (ELAVIL) 150 MG tablet Take 2 tablets (300 mg total) by mouth at bedtime. Patient taking differently: Take 350 mg by mouth at bedtime.  11/29/14   Frazier Richards, MD  aspirin EC 81 MG tablet Take 81 mg by mouth daily.    Historical Provider, MD  clonazePAM (KLONOPIN) 2 MG tablet Take 1 tablet (2 mg total) by mouth 4 (four) times daily. 11/29/14   Frazier Richards, MD  docusate sodium (COLACE) 100 MG capsule Take 1 capsule (100 mg total) by mouth 2 (two) times daily. 12/31/14   Leeanne Rio, MD  lacosamide (VIMPAT) 50 MG TABS tablet One tablet in the morning and 2 tablets in the evening 12/28/14   Kathrynn Ducking, MD  metoprolol succinate (TOPROL-XL) 50 MG 24 hr tablet Take 1 tablet (50 mg total) by mouth at bedtime. Take with or immediately following a meal. 10/24/14   [place]umet N Rumley, DO  omeprazole (PRILOSEC) 20 MG capsule Take 1 capsule (20 mg total) by mouth daily as needed. For heartburn or acid reflux. Patient not taking: Reported on 12/30/2014 05/19/14   Melony Overly, MD  phenytoin (DILANTIN) 50 MG tablet Chew 4 tablets (200 mg total) by mouth at bedtime. 11/29/14  Frazier Richards, MD  polyethylene glycol [facility] / GLYCOLAX) packet Take 17 g by mouth 2 (two) times daily as needed. For constipation. Mix into 8 ounces of fluid & drink 12/31/14   Leeanne Rio, MD  QUEtiapine (SEROQUEL) 300 MG tablet Take 3 tablets (900 mg total) by mouth at bedtime. Takes 3 tabs QHS 11/29/14   Frazier Richards, MD  ramelteon (ROZEREM) 8 MG tablet Take 1 tablet (8 mg total) by mouth at bedtime. 05/19/14   Melony Overly, MD  simvastatin (ZOCOR) 40 MG tablet Take 1 tablet (40 mg total) by mouth at bedtime. 11/29/14   Frazier Richards, MD  testosterone cypionate (DEPOTESTOTERONE CYPIONATE) 200 MG/ML injection Inject 2 mLs (400 mg total) into the muscle every 14 (fourteen) days. 05/17/14   Nena Polio, PA-C   BP 131/71  mmHg  Pulse 119  Temp(Src) 101 F (38.3 C) (Rectal)  Resp 23  SpO2 93% Physical Exam  Constitutional: He appears well-developed and well-nourished. No distress.  Pt speaking in complete sentences but speech is tangential and irrelevant to conversation.    HENT:  Head: Normocephalic and atraumatic.  Neck: Neck supple.  Cardiovascular: Normal rate and regular rhythm.   Pulmonary/Chest: Effort normal and breath sounds normal. No respiratory distress. He has no wheezes. He has no rales.  Abdominal: Soft. He exhibits no distension and no mass. There is tenderness. There is no rebound and no guarding.  Right sided abdominal pain  Neurological: He is alert. He exhibits normal muscle tone.  Skin: He is not diaphoretic.  Nursing note and vitals reviewed.   ED Course  Procedures (including critical care time) Labs Review Labs Reviewed  COMPREHENSIVE METABOLIC PANEL - Abnormal; Notable for the following:    Glucose, Bld 120 (*)    GFR calc non Af Amer 89 (*)    All other components within normal limits  URINALYSIS, ROUTINE W REFLEX MICROSCOPIC - Abnormal; Notable for the following:    Glucose, UA 100 (*)    All other components within normal limits  CBC - Abnormal; Notable for the following:    WBC 16.6 (*)    RBC 4.15 (*)    All other components within normal limits  DIFFERENTIAL - Abnormal; Notable for the following:    Neutrophils Relative % 90 (*)    Neutro Abs 14.9 (*)    Lymphocytes Relative 7 (*)    All other components within normal limits  I-STAT CG4 LACTIC ACID, ED - Abnormal; Notable for the following:    Lactic Acid, Venous 3.11 (*)    All other components within normal limits  URINE CULTURE  CULTURE, BLOOD (ROUTINE X 2)  CULTURE, BLOOD (ROUTINE X 2)  CBC WITH DIFFERENTIAL  PHENYTOIN LEVEL, TOTAL    Imaging Review Dg Chest Port 1 View  01/02/2015   CLINICAL DATA:  Pt took too much medication last night. Pt was not able to be woken up from medicine induced sleep  today. Recent coughing and congestion. Pt takes HTN meds. Not diabetic. Visitor who is with pt tonight says he has not smoked in the 10 years that she has known him but is sure that he smoked prior to her knowing him.  EXAM: PORTABLE CHEST - 1 VIEW  COMPARISON:  11/21/2014  FINDINGS: Patchy opacity is noted in the left mid and lower lung new from the prior exam. This may reflect pneumonia or aspiration pneumonitis.  Prominent bronchovascular markings are noted bilaterally, stable. No other areas of lung consolidation.  No pleural effusion or pneumothorax.  Cardiac silhouette is normal in size. No mediastinal or hilar masses or convincing adenopathy.  Bony thorax is demineralized. Stable well-positioned left shoulder prosthesis.  IMPRESSION: 1. Patchy airspace opacity in the left mid to lower lung consistent with pneumonia or aspiration pneumonitis. No other acute finding.   Electronically Signed   By: Lajean Manes M.D.   On: 01/02/2015 19:57     EKG Interpretation None       7:16 PM Dr Vanita Panda made aware of the patient.   8:51 PM Patient admitted to [facility], his PCP.  Will transfer to [name].    MDM   Final diagnoses:  HCAP (healthcare-associated pneumonia)  Sepsis, due to unspecified organism    Febrile patient with altered mental status, brought in tachycardic, hypoxic.  Pt unable to comment or add to evaluation.  Significant other only noted he was "breathing funny" and slept all day.  Was at his baseline last night.  Found to have pneumonia by CXR.  Labs significant for leukocytosis, lactic acidosis.  Blood and urine cultures pending.  Admitted to patient's PCP at [facility].      Clayton Bibles, PA-C 01/03/15 [id number]  Carmin Muskrat, MD 01/03/15 [phone]

## 2015-01-02 NOTE — ED Notes (Signed)
Bed: KT82 Expected date:  Expected time:  Means of arrival:  Comments: ?sepsis

## 2015-01-02 NOTE — ED Notes (Signed)
Main lab called for recollect of phenytoin level.

## 2015-01-02 NOTE — Progress Notes (Signed)
ANTIBIOTIC CONSULT NOTE - INITIAL  Pharmacy Consult for Vancomycin and Zosyn Indication: Sepsis  Allergies  Allergen Reactions  . Codeine Nausea And Vomiting  . Sulfa Antibiotics Nausea And Vomiting    Patient Measurements:   Adjusted Body Weight:   Vital Signs: Temp: 101 F (38.3 C) (01/17 1810) Temp Source: Rectal (01/17 1810) BP: 114/60 mmHg (01/17 1945) Pulse Rate: 106 (01/17 1945) Intake/Output from previous day:   Intake/Output from this shift:    Labs:  Recent Labs  12/31/14 0437 01/02/15 1856 01/02/15 1950  WBC 7.0  --  16.6*  HGB 14.7  --  13.8  PLT 192  --  184  CREATININE 1.22 0.94  --    Estimated Creatinine Clearance: 89 mL/min (by C-G formula based on Cr of 0.94). No results for input(s): VANCOTROUGH, VANCOPEAK, VANCORANDOM, GENTTROUGH, GENTPEAK, GENTRANDOM, TOBRATROUGH, TOBRAPEAK, TOBRARND, AMIKACINPEAK, AMIKACINTROU, AMIKACIN in the last 72 hours.   Microbiology: Recent Results (from the past 720 hour(s))  MRSA PCR Screening     Status: None   Collection Time: 12/31/14  1:43 AM  Result Value Ref Range Status   MRSA by PCR NEGATIVE NEGATIVE Final    Comment:        The GeneXpert MRSA Assay (FDA approved for NASAL specimens only), is one component of a comprehensive MRSA colonization surveillance program. It is not intended to diagnose MRSA infection nor to guide or monitor treatment for MRSA infections.     Medical History: Past Medical History  Diagnosis Date  . Seizures   . Hypertension   . GERD (gastroesophageal reflux disease)   . Dyslipidemia   . Schizoaffective disorder   . Depression   . Anxiety   . MRSA carrier     Medications:  Anti-infectives    Start     Dose/Rate Route Frequency Ordered Stop   01/02/15 1915  piperacillin-tazobactam (ZOSYN) IVPB 3.375 g     3.375 g100 mL/hr over 30 Minutes Intravenous  Once 01/02/15 1900 01/02/15 2001   01/02/15 1915  vancomycin (VANCOCIN) IVPB 1000 mg/200 mL premix     1,000  mg200 mL/hr over 60 Minutes Intravenous  Once 01/02/15 1900       Assessment: 59yo M from home with fever, lethargy, and strong-smelling urine. Pharmacy is asked to dose Vanc and Zosyn for sepsis. First doses ordered in the ED.  1/17 >> Zosyn >> 1/17 >> Vanc >>   Tmax: 101 WBCs: elevated Renal: SCr wnl, CrCl 89  Goal of Therapy:  Vancomycin trough level 15-20 mcg/ml  Appropriate antibiotic dosing for renal function; eradication of infection  Plan:  Vancomycin 1g IV q8h. Zosyn 3.375g IV Q8H infused over 4hrs. Measure Vanc trough at steady state. Follow up renal fxn and culture results.  Romeo Rabon, PharmD, pager [phone]. 01/02/2015,8:19 PM.

## 2015-01-02 NOTE — ED Notes (Signed)
[facility] called for transport. 

## 2015-01-02 NOTE — ED Notes (Signed)
Attempted to call report; RN will call back shortly.

## 2015-01-02 NOTE — ED Notes (Signed)
Pt presents via EMS from home with c/o fever that started today. Pt is also been reported by family to be lethargic and has a strong odor of urine. Per EMS, pt is conscious, alert, and oriented x 3, does not know the time of day.

## 2015-01-03 ENCOUNTER — Inpatient Hospital Stay (HOSPITAL_COMMUNITY): Payer: Medicaid Other

## 2015-01-03 DIAGNOSIS — F429 Obsessive-compulsive disorder, unspecified: Secondary | ICD-10-CM | POA: Insufficient documentation

## 2015-01-03 DIAGNOSIS — R4182 Altered mental status, unspecified: Secondary | ICD-10-CM

## 2015-01-03 DIAGNOSIS — F332 Major depressive disorder, recurrent severe without psychotic features: Secondary | ICD-10-CM

## 2015-01-03 DIAGNOSIS — F42 Obsessive-compulsive disorder: Secondary | ICD-10-CM

## 2015-01-03 LAB — URINE CULTURE
Colony Count: NO GROWTH
Culture: NO GROWTH

## 2015-01-03 LAB — BLOOD GAS, ARTERIAL
Acid-base deficit: 2.4 mmol/L — ABNORMAL HIGH (ref 0.0–2.0)
Bicarbonate: 23 mEq/L (ref 20.0–24.0)
Drawn by: 312761
O2 Content: 3 L/min
O2 Saturation: 97.3 %
Patient temperature: 98.6
TCO2: 24.4 mmol/L (ref 0–100)
pCO2 arterial: 47.6 mmHg — ABNORMAL HIGH (ref 35.0–45.0)
pH, Arterial: 7.305 — ABNORMAL LOW (ref 7.350–7.450)
pO2, Arterial: 107 mmHg — ABNORMAL HIGH (ref 80.0–100.0)

## 2015-01-03 LAB — CBC
HCT: 36.5 % — ABNORMAL LOW (ref 39.0–52.0)
Hemoglobin: 12.4 g/dL — ABNORMAL LOW (ref 13.0–17.0)
MCH: 32.8 pg (ref 26.0–34.0)
MCHC: 34 g/dL (ref 30.0–36.0)
MCV: 96.6 fL (ref 78.0–100.0)
Platelets: 176 10*3/uL (ref 150–400)
RBC: 3.78 MIL/uL — ABNORMAL LOW (ref 4.22–5.81)
RDW: 14.7 % (ref 11.5–15.5)
WBC: 10.7 10*3/uL — ABNORMAL HIGH (ref 4.0–10.5)

## 2015-01-03 LAB — INFLUENZA PANEL BY PCR (TYPE A & B)
H1N1 flu by pcr: NOT DETECTED
Influenza A By PCR: NEGATIVE
Influenza B By PCR: NEGATIVE

## 2015-01-03 LAB — RAPID URINE DRUG SCREEN, HOSP PERFORMED
Amphetamines: NOT DETECTED
Barbiturates: NOT DETECTED
Benzodiazepines: POSITIVE — AB
Cocaine: NOT DETECTED
Opiates: NOT DETECTED
Tetrahydrocannabinol: NOT DETECTED

## 2015-01-03 LAB — BASIC METABOLIC PANEL
Anion gap: 4 — ABNORMAL LOW (ref 5–15)
BUN: 9 mg/dL (ref 6–23)
CO2: 24 mmol/L (ref 19–32)
Calcium: 8 mg/dL — ABNORMAL LOW (ref 8.4–10.5)
Chloride: 114 mEq/L — ABNORMAL HIGH (ref 96–112)
Creatinine, Ser: 0.9 mg/dL (ref 0.50–1.35)
GFR calc Af Amer: >90 mL/min (ref >90)
GFR calc non Af Amer: >90 mL/min (ref >90)
Glucose, Bld: 104 mg/dL — ABNORMAL HIGH (ref 70–99)
Potassium: 3.9 mmol/L (ref 3.5–5.1)
Sodium: 142 mmol/L (ref 135–145)

## 2015-01-03 LAB — LACTIC ACID, PLASMA: Lactic Acid, Venous: 1.3 mmol/L (ref 0.5–2.2)

## 2015-01-03 LAB — TSH: TSH: 1.559 u[IU]/mL (ref 0.350–4.500)

## 2015-01-03 LAB — AMMONIA: Ammonia: 50 umol/L — ABNORMAL HIGH (ref 11–32)

## 2015-01-03 LAB — MRSA PCR SCREENING: MRSA by PCR: NEGATIVE

## 2015-01-03 MED ORDER — SODIUM CHLORIDE 0.9 % IV SOLN
50.0000 mg | Freq: Every morning | INTRAVENOUS | Status: DC
  Filled 2015-01-03: qty 5

## 2015-01-03 MED ORDER — QUETIAPINE FUMARATE 400 MG PO TABS
400.0000 mg | ORAL_TABLET | Freq: Two times a day (BID) | ORAL | Status: DC
  Administered 2015-01-03 – 2015-01-06 (×6): 400 mg via ORAL
  Filled 2015-01-03 (×7): qty 1

## 2015-01-03 MED ORDER — LORAZEPAM 2 MG/ML IJ SOLN
0.5000 mg | Freq: Four times a day (QID) | INTRAMUSCULAR | Status: DC
  Administered 2015-01-03 (×2): 0.5 mg via INTRAVENOUS
  Filled 2015-01-03 (×2): qty 1

## 2015-01-03 MED ORDER — PHENYTOIN SODIUM EXTENDED 100 MG PO CAPS
200.0000 mg | ORAL_CAPSULE | Freq: Every day | ORAL | Status: DC
  Administered 2015-01-03 – 2015-01-05 (×3): 200 mg via ORAL
  Filled 2015-01-03 (×4): qty 2

## 2015-01-03 MED ORDER — SODIUM CHLORIDE 0.9 % IJ SOLN
3.0000 mL | Freq: Two times a day (BID) | INTRAMUSCULAR | Status: DC
  Administered 2015-01-03 – 2015-01-05 (×5): 3 mL via INTRAVENOUS

## 2015-01-03 MED ORDER — RAMELTEON 8 MG PO TABS
8.0000 mg | ORAL_TABLET | Freq: Every day | ORAL | Status: DC
Start: 2015-01-03 — End: 2015-01-06
  Administered 2015-01-03 – 2015-01-05 (×3): 8 mg via ORAL
  Filled 2015-01-03 (×4): qty 1

## 2015-01-03 MED ORDER — LACOSAMIDE 200 MG/20ML IV SOLN
100.0000 mg | Freq: Every day | INTRAVENOUS | Status: DC
  Filled 2015-01-03: qty 10

## 2015-01-03 MED ORDER — CLONAZEPAM 0.5 MG PO TABS
0.5000 mg | ORAL_TABLET | Freq: Two times a day (BID) | ORAL | Status: DC
  Administered 2015-01-03 – 2015-01-06 (×6): 0.5 mg via ORAL
  Filled 2015-01-03 (×6): qty 1

## 2015-01-03 MED ORDER — SODIUM CHLORIDE 0.9 % IV SOLN
250.0000 mg | Freq: Once | INTRAVENOUS | Status: AC
  Administered 2015-01-03: 250 mg via INTRAVENOUS
  Filled 2015-01-03: qty 5

## 2015-01-03 MED ORDER — SODIUM CHLORIDE 0.9 % IV SOLN
INTRAVENOUS | Status: DC
  Administered 2015-01-03 – 2015-01-05 (×4): via INTRAVENOUS

## 2015-01-03 MED ORDER — ACETAMINOPHEN 650 MG RE SUPP: 650.0000 mg | Freq: Four times a day (QID) | RECTAL | Status: DC | PRN

## 2015-01-03 MED ORDER — AMITRIPTYLINE HCL 100 MG PO TABS
300.0000 mg | ORAL_TABLET | Freq: Every day | ORAL | Status: DC
  Administered 2015-01-03 – 2015-01-05 (×3): 300 mg via ORAL
  Filled 2015-01-03 (×4): qty 3

## 2015-01-03 MED ORDER — SODIUM CHLORIDE 0.9 % IV SOLN
100.0000 mg | Freq: Two times a day (BID) | INTRAVENOUS | Status: AC
  Administered 2015-01-03 – 2015-01-04 (×3): 100 mg via INTRAVENOUS
  Filled 2015-01-03 (×7): qty 10

## 2015-01-03 MED ORDER — LORAZEPAM 2 MG/ML IJ SOLN: 0.5000 mg | Freq: Four times a day (QID) | INTRAMUSCULAR | Status: DC

## 2015-01-03 MED ORDER — HEPARIN SODIUM (PORCINE) 5000 UNIT/ML IJ SOLN
5000.0000 [IU] | Freq: Three times a day (TID) | INTRAMUSCULAR | Status: DC
  Administered 2015-01-03 – 2015-01-05 (×7): 5000 [IU] via SUBCUTANEOUS
  Filled 2015-01-03 (×10): qty 1

## 2015-01-03 MED ORDER — ACETAMINOPHEN 325 MG PO TABS
650.0000 mg | ORAL_TABLET | Freq: Four times a day (QID) | ORAL | Status: DC | PRN
  Filled 2015-01-03: qty 2

## 2015-01-03 NOTE — Consult Note (Signed)
[facility] Consult   Reason for Consult:  psych med management for depression and OCD Referring Physician:  Katheren Shams, DO   [name] is an 59 y.o. male. Total Time spent with patient: 45 minutes  Assessment: AXIS I:  Major Depression, Recurrent severe and Obsessive Compulsive Disorder AXIS II:  Deferred AXIS III:   Past Medical History  Diagnosis Date  . Seizures   . Hypertension   . GERD (gastroesophageal reflux disease)   . Dyslipidemia   . Schizoaffective disorder   . Depression   . Anxiety   . MRSA carrier    AXIS IV:  other psychosocial or environmental problems, problems related to social environment and problems with primary support group AXIS V:  41-50 serious symptoms  Plan:  Case discussed with [name] Shams, DO Refer to out patient psychiatric treatment at [name] when medically stable Start Seroquel 400 mg PO BID, Klonopin 0.5 mg PO BID, Amitriptyline 300 mg PO Qhs and Rozerem 8 mg PO Qhs No evidence of imminent risk to self or others at present.   Patient does not meet criteria for psychiatric inpatient admission. Supportive therapy provided about ongoing stressors. Discussed crisis plan, support from social network, calling 911, coming to the Emergency Department, and calling Suicide Hotline.  Appreciate psychiatric consultation Please contact [phone] or [phone] if needs further assistance   Subjective:   [name] is a 59 y.o. male patient admitted with AMS and needs psych medication management.  HPI:  [name] is a 59 y.o. male admitted with increased confusion and fever. Patient seen and chart reviewed and case discussed. Patient stated that he has been compliant with his psych medications as prescribed and clearly informed that he has no schizophrenia or schizoaffective disorder. He has been diagnosed with depression and obsessive compulsive disorder and takes higher doses of his medication  because he was considered high metabolizer of psych medications. He is interested in genetic testing if available. Patient denied current symptoms of depression, anxiety and psychosis. He has no suicide or homicide ideation, intention or plans. Patient was taking all his medication at night time but he is willing to take his medication with split doses because his medication may be causing sedation even though not making to sleep. Patient is known to this provider from his [name] admission in May 2015.    Medical history: [name] presented with fever and confusion, found to have HCAP on CXR. Pt is minimally alert, and sleeping soundly throughout my exam. Awakens to loud voice and sternal rub then falls back asleep. Per nurse, was talkative a little while ago, but still frequently fell asleep when not directly stimulated. ER notes suggest that pt was normal last night but slept until 5pm today. Took all normal meds last night.  HPI Elements:   Location:  depression and OVD. Quality:  fair. Severity:  chronic. Timing:  physical illness. Duration:  few days. Context:  unknown.  Past Psychiatric History: Past Medical History  Diagnosis Date  . Seizures   . Hypertension   . GERD (gastroesophageal reflux disease)   . Dyslipidemia   . Schizoaffective disorder   . Depression   . Anxiety   . MRSA carrier     reports that he has never smoked. He has never used smokeless tobacco. He reports that he does not drink alcohol or use illicit drugs. Family History  Problem Relation Age of Onset  . Stroke Father  Living at 96  . Stroke Mother     Stroke in late 66's. Died in her 36's     Living Arrangements: Spouse/significant other   Abuse/Neglect [name]) Physical Abuse: Denies Verbal Abuse: Denies Sexual Abuse: Denies Allergies:   Allergies  Allergen Reactions  . Codeine Nausea And Vomiting  . Sulfa Antibiotics Nausea And Vomiting    ACT Assessment Complete:  NO Objective: Blood  pressure 122/76, pulse 85, temperature 98.1 F (36.7 C), temperature source Oral, resp. rate 22, height 5' 11"  (1.803 m), weight 83.9 kg (184 lb 15.5 oz), SpO2 98 %.Body mass index is 25.81 kg/(m^2). Results for orders placed or performed during the hospital encounter of 01/02/15 (from the past 72 hour(s))  Comprehensive metabolic panel     Status: Abnormal   Collection Time: 01/02/15  6:56 PM  Result Value Ref Range   Sodium 139 135 - 145 mmol/L    Comment: Please note change in reference range.   Potassium 4.1 3.5 - 5.1 mmol/L    Comment: Please note change in reference range.   Chloride 104 96 - 112 mEq/L   CO2 25 19 - 32 mmol/L   Glucose, Bld 120 (H) 70 - 99 mg/dL   BUN 13 6 - 23 mg/dL   Creatinine, Ser 0.94 0.50 - 1.35 mg/dL   Calcium 9.0 8.4 - 10.5 mg/dL   Total Protein 7.0 6.0 - 8.3 g/dL   Albumin 4.0 3.5 - 5.2 g/dL   AST 22 0 - 37 U/L   ALT 14 0 - 53 U/L   Alkaline Phosphatase 101 39 - 117 U/L   Total Bilirubin 0.4 0.3 - 1.2 mg/dL   GFR calc non Af Amer 89 (L) >90 mL/min   GFR calc Af Amer >90 >90 mL/min    Comment: (NOTE) The eGFR has been calculated using the CKD EPI equation. This calculation has not been validated in all clinical situations. eGFR's persistently <90 mL/min signify possible Chronic Kidney Disease.    Anion gap 10 5 - 15  I-Stat CG4 Lactic Acid, ED     Status: Abnormal   Collection Time: 01/02/15  7:05 PM  Result Value Ref Range   Lactic Acid, Venous 3.11 (H) 0.5 - 2.2 mmol/L  Phenytoin level, total     Status: None   Collection Time: 01/02/15  7:20 PM  Result Value Ref Range   Phenytoin Lvl 10.4 10.0 - 20.0 ug/mL    Comment: Performed at [facility]  Urinalysis, Routine w reflex microscopic     Status: Abnormal   Collection Time: 01/02/15  7:33 PM  Result Value Ref Range   Color, Urine YELLOW YELLOW   APPearance CLEAR CLEAR   Specific Gravity, Urine 1.022 1.005 - 1.030   pH 6.5 5.0 - 8.0   Glucose, UA 100 (A) NEGATIVE mg/dL   Hgb  urine dipstick NEGATIVE NEGATIVE   Bilirubin Urine NEGATIVE NEGATIVE   Ketones, ur NEGATIVE NEGATIVE mg/dL   Protein, ur NEGATIVE NEGATIVE mg/dL   Urobilinogen, UA 0.2 0.0 - 1.0 mg/dL   Nitrite NEGATIVE NEGATIVE   Leukocytes, UA NEGATIVE NEGATIVE    Comment: MICROSCOPIC NOT DONE ON URINES WITH NEGATIVE PROTEIN, BLOOD, LEUKOCYTES, NITRITE, OR GLUCOSE <1000 mg/dL.  CBC     Status: Abnormal   Collection Time: 01/02/15  7:50 PM  Result Value Ref Range   WBC 16.6 (H) 4.0 - 10.5 K/uL   RBC 4.15 (L) 4.22 - 5.81 MIL/uL   Hemoglobin 13.8 13.0 - 17.0 g/dL  HCT 39.6 39.0 - 52.0 %   MCV 95.4 78.0 - 100.0 fL   MCH 33.3 26.0 - 34.0 pg   MCHC 34.8 30.0 - 36.0 g/dL   RDW 14.6 11.5 - 15.5 %   Platelets 184 150 - 400 K/uL  Differential     Status: Abnormal   Collection Time: 01/02/15  7:50 PM  Result Value Ref Range   Neutrophils Relative % 90 (H) 43 - 77 %   Neutro Abs 14.9 (H) 1.7 - 7.7 K/uL   Lymphocytes Relative 7 (L) 12 - 46 %   Lymphs Abs 1.1 0.7 - 4.0 K/uL   Monocytes Relative 3 3 - 12 %   Monocytes Absolute 0.6 0.1 - 1.0 K/uL   Eosinophils Relative 0 0 - 5 %   Eosinophils Absolute 0.0 0.0 - 0.7 K/uL   Basophils Relative 0 0 - 1 %   Basophils Absolute 0.0 0.0 - 0.1 K/uL  CBG monitoring, ED     Status: Abnormal   Collection Time: 01/02/15 10:40 PM  Result Value Ref Range   Glucose-Capillary 111 (H) 70 - 99 mg/dL  Basic metabolic panel     Status: Abnormal   Collection Time: 01/03/15  1:07 AM  Result Value Ref Range   Sodium 142 135 - 145 mmol/L    Comment: Please note change in reference range.   Potassium 3.9 3.5 - 5.1 mmol/L    Comment: Please note change in reference range.   Chloride 114 (H) 96 - 112 mEq/L   CO2 24 19 - 32 mmol/L   Glucose, Bld 104 (H) 70 - 99 mg/dL   BUN 9 6 - 23 mg/dL   Creatinine, Ser 0.90 0.50 - 1.35 mg/dL   Calcium 8.0 (L) 8.4 - 10.5 mg/dL   GFR calc non Af Amer >90 >90 mL/min   GFR calc Af Amer >90 >90 mL/min    Comment: (NOTE) The eGFR has been  calculated using the CKD EPI equation. This calculation has not been validated in all clinical situations. eGFR's persistently <90 mL/min signify possible Chronic Kidney Disease.    Anion gap 4 (L) 5 - 15  CBC     Status: Abnormal   Collection Time: 01/03/15  1:07 AM  Result Value Ref Range   WBC 10.7 (H) 4.0 - 10.5 K/uL   RBC 3.78 (L) 4.22 - 5.81 MIL/uL   Hemoglobin 12.4 (L) 13.0 - 17.0 g/dL   HCT 36.5 (L) 39.0 - 52.0 %   MCV 96.6 78.0 - 100.0 fL   MCH 32.8 26.0 - 34.0 pg   MCHC 34.0 30.0 - 36.0 g/dL   RDW 14.7 11.5 - 15.5 %   Platelets 176 150 - 400 K/uL  Blood gas, arterial     Status: Abnormal   Collection Time: 01/03/15  1:23 AM  Result Value Ref Range   O2 Content 3.0 L/min   Delivery systems NASAL CANNULA    pH, Arterial 7.305 (L) 7.350 - 7.450   pCO2 arterial 47.6 (H) 35.0 - 45.0 mmHg   pO2, Arterial 107.0 (H) 80.0 - 100.0 mmHg   Bicarbonate 23.0 20.0 - 24.0 mEq/L   TCO2 24.4 0 - 100 mmol/L   Acid-base deficit 2.4 (H) 0.0 - 2.0 mmol/L   O2 Saturation 97.3 %   Patient temperature 98.6    Collection site RIGHT RADIAL    Drawn by [id number]    Sample type ARTERIAL DRAW    Allens test (pass/fail) PASS PASS  MRSA PCR Screening  Status: None   Collection Time: 01/03/15  2:03 AM  Result Value Ref Range   MRSA by PCR NEGATIVE NEGATIVE    Comment:        The GeneXpert MRSA Assay (FDA approved for NASAL specimens only), is one component of a comprehensive MRSA colonization surveillance program. It is not intended to diagnose MRSA infection nor to guide or monitor treatment for MRSA infections.   Influenza panel by PCR (type A & B, H1N1)     Status: None   Collection Time: 01/03/15  2:44 AM  Result Value Ref Range   Influenza A By PCR NEGATIVE NEGATIVE   Influenza B By PCR NEGATIVE NEGATIVE   H1N1 flu by pcr NOT DETECTED NOT DETECTED    Comment:        The Xpert Flu assay (FDA approved for nasal aspirates or washes and nasopharyngeal swab specimens),  is intended as an aid in the diagnosis of influenza and should not be used as a sole basis for treatment.   Urine rapid drug screen (hosp performed)     Status: Abnormal   Collection Time: 01/03/15  2:44 AM  Result Value Ref Range   Opiates NONE DETECTED NONE DETECTED   Cocaine NONE DETECTED NONE DETECTED   Benzodiazepines POSITIVE (A) NONE DETECTED   Amphetamines NONE DETECTED NONE DETECTED   Tetrahydrocannabinol NONE DETECTED NONE DETECTED   Barbiturates NONE DETECTED NONE DETECTED    Comment:        DRUG SCREEN FOR MEDICAL PURPOSES ONLY.  IF CONFIRMATION IS NEEDED FOR ANY PURPOSE, NOTIFY LAB WITHIN 5 DAYS.        LOWEST DETECTABLE LIMITS FOR URINE DRUG SCREEN Drug Class       Cutoff (ng/mL) Amphetamine      1000 Barbiturate      200 Benzodiazepine   935 Tricyclics       701 Opiates          300 Cocaine          300 THC              50   Lactic acid, plasma     Status: None   Collection Time: 01/03/15  2:55 AM  Result Value Ref Range   Lactic Acid, Venous 1.3 0.5 - 2.2 mmol/L  Ammonia     Status: Abnormal   Collection Time: 01/03/15  4:19 AM  Result Value Ref Range   Ammonia 50 (H) 11 - 32 umol/L    Comment: Please note change in reference range.  TSH     Status: None   Collection Time: 01/03/15  4:38 AM  Result Value Ref Range   TSH 1.559 0.350 - 4.500 uIU/mL   Labs are reviewed and are pertinent for benzo's and lactic acidosis.  Current Facility-Administered Medications  Medication Dose Route Frequency Provider Last Rate Last Dose  . 0.9 %  sodium chloride infusion   Intravenous Continuous Leeanne Rio, MD   0  at 01/03/15 0045  . acetaminophen (TYLENOL) tablet 650 mg  650 mg Oral Q6H PRN Leeanne Rio, MD       Or  . acetaminophen (TYLENOL) suppository 650 mg  650 mg Rectal Q6H PRN Leeanne Rio, MD      . lacosamide (VIMPAT) 100 mg in sodium chloride 0.9 % 25 mL IVPB  100 mg Intravenous Q12H Roland Rack, MD   100 mg at 01/03/15  0951  . LORazepam (ATIVAN) injection 0.5 mg  0.5 mg Intravenous Q6H  Leeanne Rio, MD   0.5 mg at 01/03/15 0945  . phenytoin (DILANTIN) ER capsule 200 mg  200 mg Oral QHS Roland Rack, MD      . piperacillin-tazobactam (ZOSYN) IVPB 3.375 g  3.375 g Intravenous Q8H Carmin Muskrat, MD   3.375 g at 01/03/15 0930  . sodium chloride 0.9 % injection 3 mL  3 mL Intravenous Q12H Leeanne Rio, MD   3 mL at 01/03/15 0945  . vancomycin (VANCOCIN) IVPB 1000 mg/200 mL premix  1,000 mg Intravenous Q8H Carmin Muskrat, MD   1,000 mg at 01/03/15 0400    Psychiatric Specialty Exam: Physical Exam  ROS  Blood pressure 122/76, pulse 85, temperature 98.1 F (36.7 C), temperature source Oral, resp. rate 22, height 5' 11"  (1.803 m), weight 83.9 kg (184 lb 15.5 oz), SpO2 98 %.Body mass index is 25.81 kg/(m^2).  General Appearance: Casual  Eye Contact::  Good  Speech:  Clear and Coherent  Volume:  Normal  Mood:  Anxious  Affect:  Appropriate and Congruent  Thought Process:  Coherent and Goal Directed  Orientation:  Full (Time, Place, and Person)  Thought Content:  WDL  Suicidal Thoughts:  No  Homicidal Thoughts:  No  Memory:  Immediate;   Good Recent;   Fair  Judgement:  Fair  Insight:  Good  Psychomotor Activity:  Decreased  Concentration:  Good  Recall:  Good  Fund of Knowledge:Good  Language: Good  Akathisia:  NA  Handed:  Right  AIMS (if indicated):     Assets:  Communication Skills Desire for Improvement Financial Resources/Insurance Housing Leisure Time Resilience Social Support  Sleep:      Musculoskeletal: Strength & Muscle Tone: decreased Gait & Station: unable to stand Patient leans: N/A  Treatment Plan Summary: Daily contact with patient to assess and evaluate symptoms and progress in treatment Medication management  Start Seroquel 400 mg PO BID, Klonopin 0.5 mg PO BID, Amitriptyline 300 mg PO Qhs and Rozerem 8 mg PO Qhs   Murrel Bertram,JANARDHAHA  R. 01/03/2015 11:26 AM

## 2015-01-03 NOTE — H&P (Signed)
[facility] Admission History and Physical Service Pager: [phone]  Patient name: [name] Medical record number: [id number] Date of birth: Sep 07, 1956 Age: 59 [name] Gender: male  Primary Care Provider: Chrisandra Netters, MD Consultants: none Code Status: presume full code  Chief Complaint: altered mental status  Assessment and Plan: [name] is a 59 [name] male presenting with altered mental status, found to have HCAP meeting sepsis criteria. PMH is significant for seizures, recent frequent falls, HTN.  # Sepsis secondary to HCAP: CXR with obvious LLL infiltrate diagnostic for PNA. Recent admission qualifies pt for HCAP. Meeting criteria for sepsis with elevated lactic acid of 3.3, tachycardia, fever, leukocytosis, and source. Altered mental status but protecting airway at present. - admit to stepdown unit - hydrate with IVF @ 125cc/hr (s/p 2.5L bolus in ER) - continue vanc/zosyn per pharmacy - O2 prn - monitor vitals closely - repeat lactate, BMET, CBC in AM - hold home beta blocker - check EKG (not done in ER in hypotensive pt?)  # AMS: likely related to sepsis, pt known to be altered upon admission in the past - avoid sedating meds - stat CT head to r/o acute intracranial pathology - monitor for improvement in mental status - neuro checks q2h - do not think we need LP at this time given obvious source for fever (PNA on CXR). - check ABG & UDS - low threshold for CCM consult  # Seizure d/o: - seizure precautions - restart home PO seizure meds ASAP given known recent persistent seizures, avoiding IV antiepileptics due to possibility of worsening mental status for now  # Psych: holding home meds for now while NPO, restart as soon as possible given severity of mental illness  FEN/GI: NPO until more alert, NS @ 125 cc/hr Prophylaxis: SCD's until head CT neg  Disposition: admit to stepdown unit, dispo pending further eval  History of  Present Illness: [name] is a 59 [name] male presenting with fever and confusion, found to have HCAP on CXR. Pt is minimally alert, and sleeping soundly throughout my exam. Awakens to loud voice and sternal rub then falls back asleep. Per nurse, was talkative a little while ago, but still frequently fell asleep when not directly stimulated. ER notes suggest that pt was normal last night but slept until 5pm today. Took all normal meds last night.  Otherwise no hx able to be obtained, level V caveat  Review Of Systems: level V caveat, unable to perform  Patient Active Problem List   Diagnosis Date Noted  . Sepsis 01/02/2015  . Confusion 12/31/2014  . Seizures   . Schizoaffective disorder, depressive type   . Orthostatic hypotension   . Hyperlipidemia   . Falls   . HLD (hyperlipidemia)   . Seizure 11/18/2014  . Schizoaffective disorder, unspecified type   . Essential hypertension   . Overdose of beta-adrenergic antagonist drug 10/22/2014  . Altered mental status   . Overdose   . Nocturnal enuresis 03/23/2014  . Benzodiazepine dependence, continuous 01/27/2014  . Opiate dependence 01/22/2014  . Severe major depression 01/21/2014  . Frozen shoulder syndrome 09/20/2013  . Somnolence 09/20/2013  . Schizoaffective disorder 09/20/2013  . Malaise and fatigue 04/21/2013  . Presbyopia 04/25/2012  . Androgen deficiency 09/07/2011  . Constipation, chronic 09/07/2011  . Other specified disorders of liver 12/30/2008  . Overweight 09/08/2008  . HYPERLIPIDEMIA 01/28/2008  . HYPERTENSION 01/28/2008  . Seizure disorder 01/28/2008  . OSTEOPENIA 07/01/2007   Past Medical History:  Past Medical History  Diagnosis Date  . Seizures   . Hypertension   . GERD (gastroesophageal reflux disease)   . Dyslipidemia   . Schizoaffective disorder   . Depression   . Anxiety   . MRSA carrier    Past Surgical History: Past Surgical History  Procedure Laterality Date  . Self orchectomy    .  Colonoscopy  2010  . Orif shoulder fracture    . Shoulder closed reduction Left 09/16/2013    Procedure: CLOSED REDUCTION SHOULDER;  Surgeon: [name] Pole, MD;  Location: [facility];  Service: Orthopedics;  Laterality: Left;  . Shoulder hemi-arthroplasty Left 09/18/2013    Procedure: LEFT SHOULDER HEMI-ARTHROPLASTY;  Surgeon: [name] Schooling, MD;  Location: [facility];  Service: Orthopedics;  Laterality: Left;  . Carpal tunnel release     Social History: History  Substance Use Topics  . Smoking status: Never Smoker   . Smokeless tobacco: Never Used  . Alcohol Use: No   Please also refer to relevant sections of EMR.  Family History: Family History  Problem Relation Age of Onset  . Stroke Father     Living at 46  . Stroke Mother     Stroke in late 9's. Died in her 68's   Allergies and Medications: Allergies  Allergen Reactions  . Codeine Nausea And Vomiting  . Sulfa Antibiotics Nausea And Vomiting   No current facility-administered medications on file prior to encounter.   Current Outpatient Prescriptions on File Prior to Encounter  Medication Sig Dispense Refill  . amitriptyline (ELAVIL) 150 MG tablet Take 2 tablets (300 mg total) by mouth at bedtime. (Patient taking differently: Take 300-450 mg by mouth at bedtime. ) 60 tablet 3  . aspirin EC 81 MG tablet Take 81 mg by mouth daily.    . clonazePAM (KLONOPIN) 2 MG tablet Take 1 tablet (2 mg total) by mouth 4 (four) times daily. 120 tablet 5  . docusate sodium (COLACE) 100 MG capsule Take 1 capsule (100 mg total) by mouth 2 (two) times daily. 60 capsule 0  . lacosamide (VIMPAT) 50 MG TABS tablet One tablet in the morning and 2 tablets in the evening 90 tablet 3  . phenytoin (DILANTIN) 50 MG tablet Chew 4 tablets (200 mg total) by mouth at bedtime. 120 tablet 6  . polyethylene glycol (MIRALAX / GLYCOLAX) packet Take 17 g by mouth 2 (two) times daily as needed. For constipation. Mix into 8 ounces of fluid & drink 100 each 6  .  QUEtiapine (SEROQUEL) 300 MG tablet Take 3 tablets (900 mg total) by mouth at bedtime. Takes 3 tabs QHS 90 tablet 6  . simvastatin (ZOCOR) 40 MG tablet Take 1 tablet (40 mg total) by mouth at bedtime. 30 tablet 6  . testosterone cypionate (DEPOTESTOTERONE CYPIONATE) 200 MG/ML injection Inject 2 mLs (400 mg total) into the muscle every 14 (fourteen) days. 10 mL 0  . metoprolol succinate (TOPROL-XL) 50 MG 24 hr tablet Take 1 tablet (50 mg total) by mouth at bedtime. Take with or immediately following a meal. (Patient not taking: Reported on 01/02/2015) 30 tablet 0  . ramelteon (ROZEREM) 8 MG tablet Take 1 tablet (8 mg total) by mouth at bedtime. (Patient not taking: Reported on 01/02/2015) 30 tablet 2    Objective: BP 121/74 mmHg  Pulse 97  Temp(Src) 97.5 F (36.4 C) (Oral)  Resp 24  Ht 5\' 11"  (1.803 m)  Wt 184 lb 15.5 oz (83.9 kg)  BMI 25.81 kg/m2  SpO2  96% Exam: General: NAD, lying in bed asleep HEENT: NCAT, MMM, PERRL Cardiovascular: RRR Respiratory: clear via anterior auscultation but lots of transmitted upper airway noises/snoring. Tugging with breathing but more like snoring than respiratory distress Abdomen: soft, NTTP, normal bowel sounds, no masses or organomegaly Extremities: No appreciable lower extremity edema bilaterally, calves without tenderness, erythema, or warmth Skin: no rashes noted Neuro: PERRL. Sleeps throughout exam. Briefly awakens to say "no" to my question of whether he is in pain. Otherwise minimally verbal, just sleeping.  Labs and Imaging: CBC BMET   Recent Labs Lab 01/02/15 1950  WBC 16.6*  HGB 13.8  HCT 39.6  PLT 184    Recent Labs Lab 01/02/15 1856  NA 139  K 4.1  CL 104  CO2 25  BUN 13  CREATININE 0.94  GLUCOSE 120*  CALCIUM 9.0       Leeanne Rio, MD 01/03/2015, 12:28 AM PGY-3, [facility] pager: [phone], text pages welcome

## 2015-01-03 NOTE — Progress Notes (Signed)
Family Practice Teaching Service Interval Progress Note  Pt seen at bedside along with Dr. Leonel Ramsay. Pt awoke with strong nailbed pressure. Able to speak coherently but still slightly drowsy/foggyheaded. Thought process logical and linear in general. Obviously quite improved from his prior exam. Will continue to monitor.  Chrisandra Netters, MD Family Medicine PGY-3 Service Pager [phone]

## 2015-01-03 NOTE — Progress Notes (Signed)
   01/03/15 0054  Neurological  Level of Consciousness Responds to Pain (minamally)  Orientation Level Other (comment) (UTA)  Above is a significant change from initial assessment approximately a hour ago, Pupils are 3 and still reactive and sluggish pt already has a stat ct ordered Dr. Ardelia Mems Paged to notify will continue to monitor

## 2015-01-03 NOTE — Discharge Summary (Signed)
[facility] Discharge Summary  Patient name: [name] Medical record number: [id number] Date of birth: Oct 09, 1956 Age: 59 y.o. Gender: male Date of Admission: 12/30/2014  Date of Discharge: 12/31/2014 Admitting Physician: Alveda Reasons, MD  Primary Care Provider: Chrisandra Netters, MD Consultants: None  Indication for Hospitalization: AMS and falls  Discharge Diagnoses/Problem List:   Patient Active Problem List   Diagnosis Date Noted  . MDD (major depressive disorder), recurrent severe, without psychosis   . OCD (obsessive compulsive disorder)   . Sepsis 01/02/2015  . Confusion 12/31/2014  . Seizures   . Schizoaffective disorder, depressive type   . Orthostatic hypotension   . Hyperlipidemia   . Falls   . HLD (hyperlipidemia)   . Seizure 11/18/2014  . Schizoaffective disorder, unspecified type   . Essential hypertension   . Overdose of beta-adrenergic antagonist drug 10/22/2014  . Altered mental status   . Overdose   . Nocturnal enuresis 03/23/2014  . Benzodiazepine dependence, continuous 01/27/2014  . Opiate dependence 01/22/2014  . Severe major depression 01/21/2014  . Frozen shoulder syndrome 09/20/2013  . Somnolence 09/20/2013  . Schizoaffective disorder 09/20/2013  . Malaise and fatigue 04/21/2013  . Presbyopia 04/25/2012  . Androgen deficiency 09/07/2011  . Constipation, chronic 09/07/2011  . Other specified disorders of liver 12/30/2008  . Overweight 09/08/2008  . HYPERLIPIDEMIA 01/28/2008  . HYPERTENSION 01/28/2008  . Seizure disorder 01/28/2008  . OSTEOPENIA 07/01/2007    Disposition: Left AMA  Discharge Condition: [facility]   Brief Hospital Course:  [name] is a 59 y.o. male presenting with altered mental status and confusion. PMH is significant for schizoaffective disorder, HLD, HTN, seizure disorder, and androgen deficiency.  Altered Mental status and reported frequent falls- Increased confusion possibly due to  post-ictal effects of recurrent seizures vs polypharmacy (patient with high doses of sedatives). Questionable adherence to home seizure regimen, though phenytoin level in target range. Head CT negative. Labs and UA unremarkable for any signs of infection contributing to AMS. Neurology consulted and recommended to keep patient on current seizure medication regimen as it was just adjusted 3 days prior to admission. Patient had positive orthostatic vitals.   Seizure disorder. Is on phenytoin 200mg  QHS and vimpat 50mg  (1 tab in AM, 2 in pm) at home. Patient seen by neurology on 1/12. Per neuro note hasn't been seen since 2009.Phenytoin level therapeutic. Continued home doses.   Schizoaffective Disorder. Home medications include: amitriptyline, klonopin, seroquel, and ramelteon. Held amitriptyline and ramelteon and adjusted dose of other medications: klonipin 1 mg QID and seroquel 300 mg TID. Patient mentioned during this admission that he had thoughts of harming himself at home with inhaling of his home helium. Pscyh was consulted but unable to see patient before he left. At time patient left AMA there was no concern to IVC the patient.   Patient left AMA. He had no HI/SI ideations at the time.   Issues for Follow Up:  1. Monitor for seizures and falls 2. F/u AMS 3. Needs follow-up with neurology and psychiatry for medication management  Significant Procedures: None  Significant Labs and Imaging:   Recent Labs Lab 12/31/14 0437 01/02/15 1950 01/03/15 0107  WBC 7.0 16.6* 10.7*  HGB 14.7 13.8 12.4*  HCT 41.9 39.6 36.5*  PLT 192 184 176    Recent Labs Lab 12/30/14 1752 12/31/14 0437 01/02/15 1856 01/03/15 0107  NA 140  --  139 142  K 4.2  --  4.1 3.9  CL 105  --  104 114*  CO2 28  --  25 24  GLUCOSE 114*  --  120* 104*  BUN 13  --  13 9  CREATININE 1.26 1.22 0.94 0.90  CALCIUM 9.3  --  9.0 8.0*  ALKPHOS 97  --  101  --   AST 21  --  22  --   ALT 15  --  14  --   ALBUMIN 4.8  --   4.0  --    Ct Head Wo Contrast  12/30/2014 CLINICAL DATA: Multiple seizures. Confusion. New memory loss. EXAM: CT HEAD WITHOUT CONTRAST TECHNIQUE: Contiguous axial images were obtained from the base of the skull through the vertex without intravenous contrast. COMPARISON: 11/18/2014; 10/20/2014 FINDINGS: Similar findings of atrophy with mild diffuse sulcal prominence and prominence of the bifrontal extra-axial spaces. The gray-white differentiation is maintained. No CT evidence of acute large territory infarct. No intraparenchymal or extra-axial mass or hemorrhage. Unchanged size and configuration of the ventricles and basilar cisterns. No midline shift. Limited visualization of the paranasal sinuses demonstrates minimal mucosal thickening within the right sphenoid sinus. The remaining paranasal sinuses and mastoid air cells are normally aerated. No air-fluid levels. Regional soft tissues appear normal. IMPRESSION: Similar findings of mild atrophy without acute intracranial process.   Results/Tests Pending at Time of Discharge: None  Discharge Medications:    Medication List    ASK your doctor about these medications        amitriptyline 150 MG tablet  Commonly known as:  ELAVIL  Take 2 tablets (300 mg total) by mouth at bedtime.     aspirin EC 81 MG tablet  Take 81 mg by mouth daily.     clonazePAM 2 MG tablet  Commonly known as:  KLONOPIN  Take 1 tablet (2 mg total) by mouth 4 (four) times daily.     docusate sodium 100 MG capsule  Commonly known as:  COLACE  Take 1 capsule (100 mg total) by mouth 2 (two) times daily.     lacosamide 50 MG Tabs tablet  Commonly known as:  VIMPAT  One tablet in the morning and 2 tablets in the evening     metoprolol succinate 50 MG 24 hr tablet  Commonly known as:  TOPROL-XL  Take 1 tablet (50 mg total) by mouth at bedtime. Take with or immediately following a meal.     phenytoin 50 MG tablet  Commonly known as:  DILANTIN  Chew 4  tablets (200 mg total) by mouth at bedtime.     polyethylene glycol packet  Commonly known as:  MIRALAX / GLYCOLAX  Take 17 g by mouth 2 (two) times daily as needed. For constipation. Mix into 8 ounces of fluid & drink     QUEtiapine 300 MG tablet  Commonly known as:  SEROQUEL  Take 3 tablets (900 mg total) by mouth at bedtime. Takes 3 tabs QHS     ramelteon 8 MG tablet  Commonly known as:  ROZEREM  Take 1 tablet (8 mg total) by mouth at bedtime.     simvastatin 40 MG tablet  Commonly known as:  ZOCOR  Take 1 tablet (40 mg total) by mouth at bedtime.     testosterone cypionate 200 MG/ML injection  Commonly known as:  DEPOTESTOTERONE CYPIONATE  Inject 2 mLs (400 mg total) into the muscle every 14 (fourteen) days.        Discharge Instructions: Please refer to Patient Instructions section of EMR for full details.  Patient was counseled  important signs and symptoms that should prompt return to medical care, changes in medications, dietary instructions, activity restrictions, and follow up appointments.   Follow-Up Appointments: -Left AMA   Katheren Shams, DO PGY-1, [place]

## 2015-01-03 NOTE — Consult Note (Signed)
Neurology Consultation Reason for Consult: Altered mental status, lethargy Referring Physician: Aida Raider  CC: Altered mental status  History is obtained from: Patient, medical record  HPI: [name] is a 59 y.o. male with a history of seizures, schizoaffective disorder who presented with difficulty arousal and was found to have pneumonia on chest x-ray. He was recently admitted on 1/15 with confusion and falls since increasing his dose of Vimpat due to breakthrough seizures. He was still arousable, throughout the hospitalization, however after taking his medications last night he was difficult to arouse all day today. He was therefore brought to the emergency room and has been admitted. He denies taking additional medications.   He has a history of seizures since graduate school. He is not sure of the instigating event, but does note that he did fall off of a slide as a child and hit his head.  History is somewhat limited by his encephalopathy.  ROS: A 14 point ROS was performed and is negative except as noted in the HPI.   Past Medical History  Diagnosis Date  . Seizures   . Hypertension   . GERD (gastroesophageal reflux disease)   . Dyslipidemia   . Schizoaffective disorder   . Depression   . Anxiety   . MRSA carrier     Family History: No history of seizures  Social History: Tob: Denies  Exam: Current vital signs: BP 121/74 mmHg  Pulse 97  Temp(Src) 97.5 F (36.4 C) (Oral)  Resp 24  Ht 5\' 11"  (1.803 m)  Wt 83.9 kg (184 lb 15.5 oz)  BMI 25.81 kg/m2  SpO2 96% Vital signs in last 24 hours: Temp:  [97.5 F (36.4 C)-101 F (38.3 C)] 97.5 F (36.4 C) (01/17 2310) Pulse Rate:  [96-119] 97 (01/17 2310) Resp:  [20-39] 24 (01/17 2310) BP: (89-135)/(53-78) 121/74 mmHg (01/17 2310) SpO2:  [92 %-96 %] 96 % (01/17 2310) Weight:  [83.9 kg (184 lb 15.5 oz)] 83.9 kg (184 lb 15.5 oz) (01/17 2310)   Physical Exam  Constitutional: Appears well-developed and  well-nourished.  Psych: Groggy, but appropriate Eyes: No scleral injection HENT: No OP obstrucion Head: Normocephalic.  Cardiovascular: Normal rate and regular rhythm.  Respiratory: Initially appear to be using accessory muscles, but once roused was breathing normally. GI: Soft.  No distension. There is no tenderness.  Skin: WDI  Neuro: Mental Status: Patient is difficult to arouse, requiring nailbed pressure, once aroused he is mildly lethargic No signs of aphasia or neglect He is slow to answer some questions and his thought process is mildly tangential. He appears mildly encephalopathic. Cranial Nerves: II: Visual Fields are full. Pupils are equal, round, and reactive to light.   III,IV, VI: EOMI without ptosis or diploplia.  V: Facial sensation is symmetric to temperature VII: Facial movement is symmetric.  VIII: hearing is intact to voice X: Uvula elevates symmetrically XI: Shoulder shrug is symmetric. XII: tongue is midline without atrophy or fasciculations.  Motor: Tone is normal. Bulk is normal. 5/5 strength was present in all four extremities.  Sensory: Sensation is symmetric to light touch and temperature in the arms and legs. Deep Tendon Reflexes: 2+ and symmetric in the biceps and patellae.  Cerebellar: FNF  intact bilaterally    I have reviewed labs in epic and the results pertinent to this consultation are: ABG mildly acidotic  I have reviewed the images obtained: CT head-unremarkable  Impression: 59 year old male with increased lethargy in the setting of multiple psychotropic medications. In the  absence of any signs or symptoms of posterior circulation dysfunction, I think that the prominent basilar seen on CT is unlikely to be contributing to his current presentation.  He has the appearance of a metabolic encephalopathy. I suspect that this is at least in part due to his multiple sedating medications. Also possible that the pneumonia is contributing.    Recommendations: 1) I would recommend decreasing amitriptyline, and consideration of switch to different agent if thought to be a reasonable approach by psychiatry. 2) agree with decreasing benzodiazepines for now, stopping them completely when someone is on that large a dose could be problematic. 3) continue Dilantin 200 mg daily at bedtime, will give p.m. dose tonight IV 4) continue Vimpat 100 mg twice a day 5) I would also consider decreasing the very large dose of quetiapine that he is taking. 6) I would hold his Rozerem. 7) ammonia, TSH  Roland Rack, MD [name] [phone]  If 7pm- 7am, please page neurology on call as listed in [facility].

## 2015-01-03 NOTE — Progress Notes (Signed)
Family Medicine Teaching Service Daily Progress Note Intern Pager: [phone]  Patient name: [name] Medical record number: [id number] Date of birth: 09-08-1956 Age: 59 y.o. Gender: male  Primary Care Provider: Chrisandra Netters, MD Consultants: neuro, Psych Code Status: Full  Pt Overview and Major Events to Date:  1/18: Admitted for AMS  Assessment and Plan: AVIEL DAVALOS is a 59 y.o. male presenting with altered mental status, found to have HCAP meeting sepsis criteria. PMH is significant for seizures, recent frequent falls, HTN.  # Sepsis secondary to HCAP: CXR with obvious LLL infiltrate diagnostic for PNA. Recent admission qualifies pt for HCAP. Meeting criteria for sepsis with elevated lactic acid of 3.3, tachycardia, fever, leukocytosis, and source.  - continue vanc/zosyn per pharmacy - O2 prn - monitor vitals closely - hold home beta blocker - lactic acid improved to 1.3 - WBC 10.7, down from 16.6 - EKG unremarkable  # AMS: likely related to sepsis, pt known to be altered upon admission in the past. CT head no evidence of infarct or hemorrhage. UDS positive for Benzos. Patient with improved MS this afternoon.  - avoid sedating meds - monitor for improvement in mental status -frequent neuro checks - ABG: pH 7.3, CO2 47.6, bicarb 23 -ammonia mildly elevated at 50  # Seizure d/o: - seizure precautions - neuro consulted; appreciate recs - Dilantin 200 mg daily at bedtime and Vimpat 100 mg twice a day  # Psych:  -psych was consulted for AMS and help with psych meds -patient's meds adjusted as such: Seroquel 400 mg PO BID, Klonopin 0.5 mg PO BID, Amitriptyline 300 mg PO Qhs and Rozerem 8 mg PO Qhs  FEN/GI: heart healthy diet, SLIV Prophylaxis: hep subq  Disposition: Continue current management; pending improvement.  Subjective:  Patient with much improved MS upon examination this afternoon. He has no concerns. Denies CP, SOB.   Objective: Temp:  [97.5 F (36.4  C)-101 F (38.3 C)] 98.1 F (36.7 C) (01/18 0731) Pulse Rate:  [84-119] 85 (01/18 0500) Resp:  [20-39] 22 (01/18 0500) BP: (89-135)/(53-78) 122/76 mmHg (01/18 0731) SpO2:  [92 %-98 %] 98 % (01/18 0731) Weight:  [184 lb 15.5 oz (83.9 kg)] 184 lb 15.5 oz (83.9 kg) (01/17 2310) Physical Exam: General: NAD, lying in bed eating.  HEENT: NCAT, MMM, PERRL Cardiovascular: RRR, no m/r/g appricated Respiratory: Crackles appreciated in LLL. Comfortable work of breathing.  Abdomen: soft, NTTP, normal bowel sounds, no masses or organomegaly Extremities: No appreciable lower extremity edema bilaterally, calves without tenderness, erythema, or warmth Skin: no rashes noted Neuro: CNs grossly intact, strength 5/5, sensation grossly intact, A&Ox2.   Laboratory: Results for orders placed or performed during the hospital encounter of 01/02/15 (from the past 24 hour(s))  Comprehensive metabolic panel     Status: Abnormal   Collection Time: 01/02/15  6:56 PM  Result Value Ref Range   Sodium 139 135 - 145 mmol/L   Potassium 4.1 3.5 - 5.1 mmol/L   Chloride 104 96 - 112 mEq/L   CO2 25 19 - 32 mmol/L   Glucose, Bld 120 (H) 70 - 99 mg/dL   BUN 13 6 - 23 mg/dL   Creatinine, Ser 0.94 0.50 - 1.35 mg/dL   Calcium 9.0 8.4 - 10.5 mg/dL   Total Protein 7.0 6.0 - 8.3 g/dL   Albumin 4.0 3.5 - 5.2 g/dL   AST 22 0 - 37 U/L   ALT 14 0 - 53 U/L   Alkaline Phosphatase 101 39 - 117 U/L  Total Bilirubin 0.4 0.3 - 1.2 mg/dL   GFR calc non Af Amer 89 (L) >90 mL/min   GFR calc Af Amer >90 >90 mL/min   Anion gap 10 5 - 15  I-Stat CG4 Lactic Acid, ED     Status: Abnormal   Collection Time: 01/02/15  7:05 PM  Result Value Ref Range   Lactic Acid, Venous 3.11 (H) 0.5 - 2.2 mmol/L  Phenytoin level, total     Status: None   Collection Time: 01/02/15  7:20 PM  Result Value Ref Range   Phenytoin Lvl 10.4 10.0 - 20.0 ug/mL  Urinalysis, Routine w reflex microscopic     Status: Abnormal   Collection Time: 01/02/15  7:33  PM  Result Value Ref Range   Color, Urine YELLOW YELLOW   APPearance CLEAR CLEAR   Specific Gravity, Urine 1.022 1.005 - 1.030   pH 6.5 5.0 - 8.0   Glucose, UA 100 (A) NEGATIVE mg/dL   Hgb urine dipstick NEGATIVE NEGATIVE   Bilirubin Urine NEGATIVE NEGATIVE   Ketones, ur NEGATIVE NEGATIVE mg/dL   Protein, ur NEGATIVE NEGATIVE mg/dL   Urobilinogen, UA 0.2 0.0 - 1.0 mg/dL   Nitrite NEGATIVE NEGATIVE   Leukocytes, UA NEGATIVE NEGATIVE  CBC     Status: Abnormal   Collection Time: 01/02/15  7:50 PM  Result Value Ref Range   WBC 16.6 (H) 4.0 - 10.5 K/uL   RBC 4.15 (L) 4.22 - 5.81 MIL/uL   Hemoglobin 13.8 13.0 - 17.0 g/dL   HCT 39.6 39.0 - 52.0 %   MCV 95.4 78.0 - 100.0 fL   MCH 33.3 26.0 - 34.0 pg   MCHC 34.8 30.0 - 36.0 g/dL   RDW 14.6 11.5 - 15.5 %   Platelets 184 150 - 400 K/uL  Differential     Status: Abnormal   Collection Time: 01/02/15  7:50 PM  Result Value Ref Range   Neutrophils Relative % 90 (H) 43 - 77 %   Neutro Abs 14.9 (H) 1.7 - 7.7 K/uL   Lymphocytes Relative 7 (L) 12 - 46 %   Lymphs Abs 1.1 0.7 - 4.0 K/uL   Monocytes Relative 3 3 - 12 %   Monocytes Absolute 0.6 0.1 - 1.0 K/uL   Eosinophils Relative 0 0 - 5 %   Eosinophils Absolute 0.0 0.0 - 0.7 K/uL   Basophils Relative 0 0 - 1 %   Basophils Absolute 0.0 0.0 - 0.1 K/uL  CBG monitoring, ED     Status: Abnormal   Collection Time: 01/02/15 10:40 PM  Result Value Ref Range   Glucose-Capillary 111 (H) 70 - 99 mg/dL  Basic metabolic panel     Status: Abnormal   Collection Time: 01/03/15  1:07 AM  Result Value Ref Range   Sodium 142 135 - 145 mmol/L   Potassium 3.9 3.5 - 5.1 mmol/L   Chloride 114 (H) 96 - 112 mEq/L   CO2 24 19 - 32 mmol/L   Glucose, Bld 104 (H) 70 - 99 mg/dL   BUN 9 6 - 23 mg/dL   Creatinine, Ser 0.90 0.50 - 1.35 mg/dL   Calcium 8.0 (L) 8.4 - 10.5 mg/dL   GFR calc non Af Amer >90 >90 mL/min   GFR calc Af Amer >90 >90 mL/min   Anion gap 4 (L) 5 - 15  CBC     Status: Abnormal   Collection  Time: 01/03/15  1:07 AM  Result Value Ref Range   WBC 10.7 (H) 4.0 -  10.5 K/uL   RBC 3.78 (L) 4.22 - 5.81 MIL/uL   Hemoglobin 12.4 (L) 13.0 - 17.0 g/dL   HCT 36.5 (L) 39.0 - 52.0 %   MCV 96.6 78.0 - 100.0 fL   MCH 32.8 26.0 - 34.0 pg   MCHC 34.0 30.0 - 36.0 g/dL   RDW 14.7 11.5 - 15.5 %   Platelets 176 150 - 400 K/uL  Blood gas, arterial     Status: Abnormal   Collection Time: 01/03/15  1:23 AM  Result Value Ref Range   O2 Content 3.0 L/min   Delivery systems NASAL CANNULA    pH, Arterial 7.305 (L) 7.350 - 7.450   pCO2 arterial 47.6 (H) 35.0 - 45.0 mmHg   pO2, Arterial 107.0 (H) 80.0 - 100.0 mmHg   Bicarbonate 23.0 20.0 - 24.0 mEq/L   TCO2 24.4 0 - 100 mmol/L   Acid-base deficit 2.4 (H) 0.0 - 2.0 mmol/L   O2 Saturation 97.3 %   Patient temperature 98.6    Collection site RIGHT RADIAL    Drawn by [id number]    Sample type ARTERIAL DRAW    Allens test (pass/fail) PASS PASS  MRSA PCR Screening     Status: None   Collection Time: 01/03/15  2:03 AM  Result Value Ref Range   MRSA by PCR NEGATIVE NEGATIVE  Urine rapid drug screen (hosp performed)     Status: Abnormal   Collection Time: 01/03/15  2:44 AM  Result Value Ref Range   Opiates NONE DETECTED NONE DETECTED   Cocaine NONE DETECTED NONE DETECTED   Benzodiazepines POSITIVE (A) NONE DETECTED   Amphetamines NONE DETECTED NONE DETECTED   Tetrahydrocannabinol NONE DETECTED NONE DETECTED   Barbiturates NONE DETECTED NONE DETECTED  Lactic acid, plasma     Status: None   Collection Time: 01/03/15  2:55 AM  Result Value Ref Range   Lactic Acid, Venous 1.3 0.5 - 2.2 mmol/L  Ammonia     Status: Abnormal   Collection Time: 01/03/15  4:19 AM  Result Value Ref Range   Ammonia 50 (H) 11 - 32 umol/L  TSH     Status: None   Collection Time: 01/03/15  4:38 AM  Result Value Ref Range   TSH 1.559 0.350 - 4.500 uIU/mL    Imaging/Diagnostic Tests: Ct Head Wo Contrast 01/03/2015   IMPRESSION: 1. Prominent density of the basilar which  could be artifact or clot. If the patient's deficit could be explained by posterior circulation ischemia, angiographic follow-up is recommended. 2. No evidence of infarct or hemorrhage.     Dg Chest Port 1 View 01/02/2015  IMPRESSION: 1. Patchy airspace opacity in the left mid to lower lung consistent with pneumonia or aspiration pneumonitis. No other acute finding.     Katheren Shams, DO 01/03/2015, 8:50 AM PGY-1, [place] Intern pager: [phone], text pages welcome

## 2015-01-03 NOTE — Progress Notes (Signed)
Utilization Review Completed.Donne Anon T1/18/2016

## 2015-01-03 NOTE — Progress Notes (Signed)
Family Practice Teaching Service Interval Progress Note  Received page from radiologist that CT head shows possible greater density in basilar artery compared to other vessels. This could represent an artifact, but less likely could represent a posterior circulation stroke. Recommended getting neurology involved.  Called and spoke with Dr. Leonel Ramsay, neuro on call, who will come to evaluate patient. Will await neuro recs for further management regarding possibility of posterior CVA, also recommendations regarding seizure eval.  Chrisandra Netters, MD [facility] Medicine PGY-3 Service Pager [phone]

## 2015-01-04 DIAGNOSIS — R400 Somnolence: Secondary | ICD-10-CM

## 2015-01-04 DIAGNOSIS — A419 Sepsis, unspecified organism: Principal | ICD-10-CM

## 2015-01-04 DIAGNOSIS — J189 Pneumonia, unspecified organism: Secondary | ICD-10-CM

## 2015-01-04 LAB — VANCOMYCIN, TROUGH: Vancomycin Tr: 19.1 ug/mL (ref 10.0–20.0)

## 2015-01-04 MED ORDER — LORAZEPAM 2 MG/ML IJ SOLN: 1.0000 mg | Freq: Four times a day (QID) | INTRAMUSCULAR | Status: DC | PRN

## 2015-01-04 MED ORDER — AMOXICILLIN-POT CLAVULANATE 875-125 MG PO TABS
1.0000 | ORAL_TABLET | Freq: Two times a day (BID) | ORAL | Status: DC
  Administered 2015-01-04 – 2015-01-06 (×4): 1 via ORAL
  Filled 2015-01-04 (×5): qty 1

## 2015-01-04 MED ORDER — THIAMINE HCL 100 MG/ML IJ SOLN
100.0000 mg | Freq: Every day | INTRAMUSCULAR | Status: DC
  Filled 2015-01-04 (×2): qty 1

## 2015-01-04 MED ORDER — LACOSAMIDE 50 MG PO TABS
100.0000 mg | ORAL_TABLET | Freq: Two times a day (BID) | ORAL | Status: DC
Start: 2015-01-04 — End: 2015-01-06
  Administered 2015-01-04 – 2015-01-06 (×4): 100 mg via ORAL
  Filled 2015-01-04 (×4): qty 2

## 2015-01-04 MED ORDER — LORAZEPAM 1 MG PO TABS: 1.0000 mg | ORAL_TABLET | Freq: Four times a day (QID) | ORAL | Status: DC | PRN

## 2015-01-04 MED ORDER — BISACODYL 5 MG PO TBEC
5.0000 mg | DELAYED_RELEASE_TABLET | Freq: Every day | ORAL | Status: DC | PRN
  Administered 2015-01-04: 5 mg via ORAL
  Filled 2015-01-04: qty 1

## 2015-01-04 MED ORDER — LEVOFLOXACIN 750 MG PO TABS: 750.0000 mg | ORAL_TABLET | Freq: Every day | ORAL | Status: DC

## 2015-01-04 MED ORDER — SENNA 8.6 MG PO TABS
1.0000 | ORAL_TABLET | Freq: Every day | ORAL | Status: DC
Start: 2015-01-04 — End: 2015-01-06
  Administered 2015-01-04 – 2015-01-06 (×3): 8.6 mg via ORAL
  Filled 2015-01-04 (×3): qty 1

## 2015-01-04 MED ORDER — FOLIC ACID 1 MG PO TABS
1.0000 mg | ORAL_TABLET | Freq: Every day | ORAL | Status: DC
  Administered 2015-01-04 – 2015-01-06 (×3): 1 mg via ORAL
  Filled 2015-01-04 (×3): qty 1

## 2015-01-04 MED ORDER — ADULT MULTIVITAMIN W/MINERALS CH
1.0000 | ORAL_TABLET | Freq: Every day | ORAL | Status: DC
  Administered 2015-01-04 – 2015-01-06 (×3): 1 via ORAL
  Filled 2015-01-04 (×3): qty 1

## 2015-01-04 MED ORDER — VITAMIN B-1 100 MG PO TABS
100.0000 mg | ORAL_TABLET | Freq: Every day | ORAL | Status: DC
  Administered 2015-01-04 – 2015-01-06 (×3): 100 mg via ORAL
  Filled 2015-01-04 (×3): qty 1

## 2015-01-04 MED ORDER — POLYETHYLENE GLYCOL 3350 17 G PO PACK
17.0000 g | PACK | Freq: Every day | ORAL | Status: DC
  Administered 2015-01-04: 17 g via ORAL
  Filled 2015-01-04: qty 1

## 2015-01-04 NOTE — Discharge Summary (Signed)
[facility] Discharge Summary  Patient name: [name] Medical record number: [id number] Date of birth: 16-Dec-1956 Age: 59 y.o. Gender: male Date of Admission: 01/02/2015  Date of Discharge: 01/06/2015  Admitting Physician: Lupita Dawn, MD  Primary Care Provider: Chrisandra Netters, MD Consultants: neuro, psych  Indication for Hospitalization: AMS  Discharge Diagnoses/Problem List:  Sepsis 2/2 HCAP Altered mental status Seizure disorder Psych Constipation  Disposition: home  Discharge Condition: stable  Discharge Exam:  Filed Vitals:   01/06/15 0529  BP: 137/86  Pulse: 89  Temp: 87 F (30.6 C)  Resp: 20   General: NAD, lying in bed.  HEENT: NCAT, MMM, PERRL Cardiovascular: RRR, no m/r/g appricated Respiratory: CTAB, no w/r/c. Comfortable work of breathing.  Abdomen: soft, NTTP, normal bowel sounds, no masses or organomegaly Extremities: No appreciable lower extremity edema bilaterally, calves without tenderness, erythema, or warmth Neuro: CNs grossly intact, strength 5/5, sensation grossly intact, A&Ox3, speech normal  Brief Hospital Course: WYLEE DORANTES is a 59 y.o. male presenting with altered mental status, found to have HCAP meeting sepsis criteria. PMH is significant for seizures, recent frequent falls, HTN.  # Sepsis secondary to HCAP: PAtient presenting with AMS, and found to meet criteria for sepsis with elevated lactic acid of 3.3, tachycardia, fever, leukocytosis, and source. CXR with obvious LLL infiltrate diagnostic for PNA, and patient was recently admitted to hospital, so covered for HCAP. Initially started on Vanc/Zosyn and deescalated to augmentin on 1/19 for total 7 day course. Lactic acid downtrended to 1.3 and leukocytosis resolved. Remained afebrile throughout remainder of hospitalization. Blood cultures from admission without any growth.  # AMS: Patient noted to be minimally alert on admission.  Thoughout to be  multifactorial related to sepsis, Improving. likely related to sepsis 2/2 HCAP, seizure disorder, and multiple sedating medications.  Psychiatry and neurology consulted for medication management. CT head no evidence of infarct or hemorrhage. UDS positive for Benzos. ABG: pH 7.3, CO2 47.6, bicarb 23 and ammonia mildly elevated at 50.  Mental status improved throughout admission as infection cleared and medication regimen clarified as below.  Seizure d/o: Neurology was consulted on admission for AMS and medication management.  They increased AEDs to Dilantin 200mg  qhs and Vimpat 100mg  BID.  Patient with reported seizure activity on 1/18, but no further after meds adjusted.  Psych: Psychiatry was consulted on admission for AMS and medication management.  Patient's meds adjusted as such: Seroquel 400 mg PO BID, Klonopin 0.5 mg PO BID, Amitriptyline 300 mg PO Qhs and Rozerem 8 mg PO Qhs.   Constipation: Managed with miralax and senna.  All other chronic medical conditions stable throughout admission and managed with home regimens.  Issues for Follow Up:  - OP psych follow-up with [name] - OP neuro follow-up - f/u for continued improved resp status and resolution of HCAP - f/u constipation  Significant Procedures: none  Significant Labs and Imaging:   Recent Labs Lab 01/02/15 1950 01/03/15 0107 01/05/15 0550  WBC 16.6* 10.7* 6.3  HGB 13.8 12.4* 13.3  HCT 39.6 36.5* 38.4*  PLT 184 176 178    Recent Labs Lab 12/30/14 1752 12/31/14 0437 01/02/15 1856 01/03/15 0107 01/05/15 0550  NA 140  --  139 142 142  K 4.2  --  4.1 3.9 4.5  CL 105  --  104 114* 110  CO2 28  --  25 24 22   GLUCOSE 114*  --  120* 104* 94  BUN 13  --  13 9 8   CREATININE 1.26 1.22 0.94 0.90 0.91  CALCIUM 9.3  --  9.0 8.0* 8.9  ALKPHOS 97  --  101  --   --   AST 21  --  22  --   --   ALT 15  --  14  --   --   ALBUMIN 4.8  --  4.0  --   --     Ct Head Wo Contrast (1/18): 1. Prominent density of the  basilar which could be artifact or clot. If the patient's deficit could be explained by posterior circulation ischemia, angiographic follow-up is recommended. 2. No evidence of infarct or hemorrhage.   Dg Chest Port 1 View (1/17): 1. Patchy airspace opacity in the left mid to lower lung consistent with pneumonia or aspiration pneumonitis. No other acute finding.   Results/Tests Pending at Time of Discharge: BCx (1/17) - NGTD  Discharge Medications:    Medication List    TAKE these medications        amitriptyline 150 MG tablet  Commonly known as:  ELAVIL  Take 2 tablets (300 mg total) by mouth at bedtime.     amoxicillin-clavulanate 875-125 MG per tablet  Commonly known as:  AUGMENTIN  Take 1 tablet by mouth every 12 (twelve) hours.     aspirin EC 81 MG tablet  Take 81 mg by mouth daily.     bisacodyl 5 MG EC tablet  Commonly known as:  DULCOLAX  Take 1 tablet (5 mg total) by mouth daily as needed for moderate constipation.     clonazePAM 0.5 MG tablet  Commonly known as:  KLONOPIN  Take 1 tablet (0.5 mg total) by mouth 2 (two) times daily.     docusate sodium 100 MG capsule  Commonly known as:  COLACE  Take 1 capsule (100 mg total) by mouth 2 (two) times daily.     Lacosamide 100 MG Tabs  Take 1 tablet (100 mg total) by mouth 2 (two) times daily.     metoprolol succinate 50 MG 24 hr tablet  Commonly known as:  TOPROL-XL  Take 1 tablet (50 mg total) by mouth at bedtime. Take with or immediately following a meal.     phenytoin 50 MG tablet  Commonly known as:  DILANTIN  Chew 4 tablets (200 mg total) by mouth at bedtime.     polyethylene glycol packet  Commonly known as:  MIRALAX / GLYCOLAX  Take 17 g by mouth 2 (two) times daily as needed. For constipation. Mix into 8 ounces of fluid & drink     QUEtiapine 400 MG tablet  Commonly known as:  SEROQUEL  Take 1 tablet (400 mg total) by mouth 2 (two) times daily.     ramelteon 8 MG tablet  Commonly known as:   ROZEREM  Take 1 tablet (8 mg total) by mouth at bedtime.     senna 8.6 MG Tabs tablet  Commonly known as:  SENOKOT  Take 1 tablet (8.6 mg total) by mouth daily.     simvastatin 40 MG tablet  Commonly known as:  ZOCOR  Take 1 tablet (40 mg total) by mouth at bedtime.     testosterone cypionate 200 MG/ML injection  Commonly known as:  DEPOTESTOTERONE CYPIONATE  Inject 2 mLs (400 mg total) into the muscle every 14 (fourteen) days.        Discharge Instructions: Please refer to Patient Instructions section of EMR for full details.  Patient was counseled important signs  and symptoms that should prompt return to medical care, changes in medications, dietary instructions, activity restrictions, and follow up appointments.   Follow-Up Appointments: Follow-up Information    Follow up with Christa See, MD On 01/11/2015.   Specialty:  Family Medicine   Why:  Appointment made For hospital follow-up on 1/26 at 2:30pm   Contact information:   [place] [address] [phone]       Follow up with [facility]. Schedule an appointment as soon as possible for a visit in 2 weeks.   Specialty:  Neurology   Why:  For hospital follow-up   Contact information:   [address] [place] [phone]      Follow up with [facility]. Schedule an appointment as soon as possible for a visit in 2 weeks.   Specialty:  Behavioral Health   Why:  For hospital follow-up   Contact information:   [place] [address] [phone]       Lavon Paganini, MD 01/06/2015, 11:19 AM PGY-1, [place]

## 2015-01-04 NOTE — Progress Notes (Signed)
Subjective: Patient awake and alert.  No further lethargy or altered mental status noted.  Continues to complain of dry mouth.    Objective: Current vital signs: BP 130/78 mmHg  Pulse 92  Temp(Src) 98.2 F (36.8 C) (Oral)  Resp 24  Ht 5\' 11"  (1.803 m)  Wt 83.9 kg (184 lb 15.5 oz)  BMI 25.81 kg/m2  SpO2 98% Vital signs in last 24 hours: Temp:  [97.6 F (36.4 C)-98.6 F (37 C)] 98.2 F (36.8 C) (01/19 1140) Pulse Rate:  [91-98] 92 (01/19 1140) Resp:  [21-39] 24 (01/19 1140) BP: (118-137)/(63-78) 130/78 mmHg (01/19 1140) SpO2:  [96 %-98 %] 98 % (01/19 1140)  Intake/Output from previous day: 01/18 0701 - 01/19 0700 In: 3360 [P.O.:360; I.V.:2725; IV Piggyback:275] Out: 2100 [Urine:2100] Intake/Output this shift: Total I/O In: 720 [I.V.:500; IV Piggyback:220] Out: 6295 [Urine:1375] Nutritional status: Diet Heart  Neurologic Exam: Mental Status: Awake and alert.  Oriented.  At times inappropriate.  Follows commands.  Speech fluent. Cranial Nerves: II: Visual Fields are full. Pupils are equal, round, and reactive to light.  III,IV, VI: EOMI without ptosis or diploplia.  V: Facial sensation is symmetric to temperature VII: Facial movement is symmetric.  VIII: hearing is intact to voice X: Uvula elevates symmetrically XI: Shoulder shrug is symmetric. XII: tongue is midline without atrophy or fasciculations.  Motor: Tone is normal. Bulk is normal. 5/5 strength was present in all four extremities.  Sensory: Sensation is symmetric to light touch and temperature in the arms and legs. Deep Tendon Reflexes: 2+ and symmetric in the biceps and patellae.  Cerebellar: FNF intact bilaterally  Lab Results: Basic Metabolic Panel:  Recent Labs Lab 12/30/14 1752 12/31/14 0437 01/02/15 1856 01/03/15 0107  NA 140  --  139 142  K 4.2  --  4.1 3.9  CL 105  --  104 114*  CO2 28  --  25 24  GLUCOSE 114*  --  120* 104*  BUN 13  --  13 9  CREATININE 1.26 1.22 0.94 0.90   CALCIUM 9.3  --  9.0 8.0*    Liver Function Tests:  Recent Labs Lab 12/30/14 1752 01/02/15 1856  AST 21 22  ALT 15 14  ALKPHOS 97 101  BILITOT 0.5 0.4  PROT 7.5 7.0  ALBUMIN 4.8 4.0   No results for input(s): LIPASE, AMYLASE in the last 168 hours.  Recent Labs Lab 01/03/15 0419  AMMONIA 50*    CBC:  Recent Labs Lab 12/30/14 1752 12/31/14 0437 01/02/15 1950 01/03/15 0107  WBC 8.3 7.0 16.6* 10.7*  NEUTROABS  --   --  14.9*  --   HGB 15.6 14.7 13.8 12.4*  HCT 44.9 41.9 39.6 36.5*  MCV 95.5 93.7 95.4 96.6  PLT 222 192 184 176    Cardiac Enzymes: No results for input(s): CKTOTAL, CKMB, CKMBINDEX, TROPONINI in the last 168 hours.  Lipid Panel: No results for input(s): CHOL, TRIG, HDL, CHOLHDL, VLDL, LDLCALC in the last 168 hours.  CBG:  Recent Labs Lab 01/02/15 2240  GLUCAP 111*    Microbiology: Results for orders placed or performed during the hospital encounter of 01/02/15  Culture, blood (routine x 2)     Status: None (Preliminary result)   Collection Time: 01/02/15  6:56 PM  Result Value Ref Range Status   Specimen Description BLOOD RIGHT ARM  Final   Special Requests BOTTLES DRAWN AEROBIC AND ANAEROBIC 5CC  Final   Culture   Final  BLOOD CULTURE RECEIVED NO GROWTH TO DATE CULTURE WILL BE HELD FOR 5 DAYS BEFORE ISSUING A FINAL NEGATIVE REPORT Performed at [facility]    Report Status PENDING  Incomplete  Culture, blood (routine x 2)     Status: None (Preliminary result)   Collection Time: 01/02/15  7:14 PM  Result Value Ref Range Status   Specimen Description BLOOD R HAND  Final   Special Requests BOTTLES DRAWN AEROBIC AND ANAEROBIC 3ML  Final   Culture   Final           BLOOD CULTURE RECEIVED NO GROWTH TO DATE CULTURE WILL BE HELD FOR 5 DAYS BEFORE ISSUING A FINAL NEGATIVE REPORT Performed at [facility]    Report Status PENDING  Incomplete  Urine culture     Status: None   Collection Time: 01/02/15  7:33 PM   Result Value Ref Range Status   Specimen Description URINE, CATHETERIZED  Final   Special Requests NONE  Final   Colony Count NO GROWTH Performed at [facility]   Final   Culture NO GROWTH Performed at [facility]   Final   Report Status 01/03/2015 FINAL  Final  MRSA PCR Screening     Status: None   Collection Time: 01/03/15  2:03 AM  Result Value Ref Range Status   MRSA by PCR NEGATIVE NEGATIVE Final    Comment:        The GeneXpert MRSA Assay (FDA approved for NASAL specimens only), is one component of a comprehensive MRSA colonization surveillance program. It is not intended to diagnose MRSA infection nor to guide or monitor treatment for MRSA infections.     Coagulation Studies: No results for input(s): LABPROT, INR in the last 72 hours.  Imaging: Ct Head Wo Contrast  01/03/2015   CLINICAL DATA:  Altered mental status.  EXAM: CT HEAD WITHOUT CONTRAST  TECHNIQUE: Contiguous axial images were obtained from the base of the skull through the vertex without intravenous contrast.  COMPARISON:  12/30/2014  FINDINGS: Skull and Sinuses:Negative for fracture or destructive process. The mastoids, middle ears, and imaged paranasal sinuses are clear.  Orbits: No acute abnormality.  Brain: The basilar is prominent in density relative to the other intracranial vessels, without definite streak artifact at this level. No ischemic changes noted. No intracranial hemorrhage, hydrocephalus, or mass effect.  These results were called by telephone at the time of interpretation on 01/03/2015 at 2:01 am to Dr. Ardelia Mems, who verbally acknowledged these results.  IMPRESSION: 1. Prominent density of the basilar which could be artifact or clot. If the patient's deficit could be explained by posterior circulation ischemia, angiographic follow-up is recommended. 2. No evidence of infarct or hemorrhage.   Electronically Signed   By: Jorje Guild M.D.   On: 01/03/2015 02:03   Dg Chest Port  1 View  01/02/2015   CLINICAL DATA:  Pt took too much medication last night. Pt was not able to be woken up from medicine induced sleep today. Recent coughing and congestion. Pt takes HTN meds. Not diabetic. Visitor who is with pt tonight says he has not smoked in the 10 years that she has known him but is sure that he smoked prior to her knowing him.  EXAM: PORTABLE CHEST - 1 VIEW  COMPARISON:  11/21/2014  FINDINGS: Patchy opacity is noted in the left mid and lower lung new from the prior exam. This may reflect pneumonia or aspiration pneumonitis.  Prominent bronchovascular markings are noted bilaterally,  stable. No other areas of lung consolidation. No pleural effusion or pneumothorax.  Cardiac silhouette is normal in size. No mediastinal or hilar masses or convincing adenopathy.  Bony thorax is demineralized. Stable well-positioned left shoulder prosthesis.  IMPRESSION: 1. Patchy airspace opacity in the left mid to lower lung consistent with pneumonia or aspiration pneumonitis. No other acute finding.   Electronically Signed   By: Lajean Manes M.D.   On: 01/02/2015 19:57    Medications:  I have reviewed the patient's current medications. Scheduled: . amitriptyline  300 mg Oral QHS  . amoxicillin-clavulanate  1 tablet Oral Q12H  . clonazePAM  0.5 mg Oral BID  . folic acid  1 mg Oral Daily  . heparin subcutaneous  5,000 Units Subcutaneous 3 times per day  . lacosamide  100 mg Oral BID  . multivitamin with minerals  1 tablet Oral Daily  . phenytoin  200 mg Oral QHS  . polyethylene glycol  17 g Oral Daily  . QUEtiapine  400 mg Oral BID  . ramelteon  8 mg Oral QHS  . senna  1 tablet Oral Daily  . sodium chloride  3 mL Intravenous Q12H  . thiamine  100 mg Oral Daily   Or  . thiamine  100 mg Intravenous Daily    Assessment/Plan: No further altered mental status noted.  TSH normal.  Ammonia mildly elevated.  Change in mental status likely secondary to medications.  Will need psych to be  involved if any changes to be made.    Recommendations: 1.  Agree with psychiatry involvement. 2.  No further neurologic intervention is recommended at this time.  If further questions arise, please call or page at that time.  Thank you for allowing neurology to participate in the care of this patient.  Continue Vimpat, Dilantin and Klonopin at current doses.    Alexis Goodell, MD [name] [phone] 01/04/2015  1:41 PM    LOS: 2 days   Alexis Goodell, MD [name] [phone] 01/04/2015  1:37 PM

## 2015-01-04 NOTE — Progress Notes (Signed)
Attempted to call report

## 2015-01-04 NOTE — Progress Notes (Signed)
Transfer note:  Arrival Method: Wheelchair from 3S Mental Orientation:A&Ox3 Skin: Dry;intact IV: R wrist NS@125ml  Pain: Denies Safety Measures: bed alarm; yellow socks, call light in reach. Fall Prevention Safety Plan: Reviewed with patient  27 Orientation: Patient has been oriented to the unit, staff and to the room.

## 2015-01-04 NOTE — Consult Note (Signed)
Psychiatry Consult follow-up note  Reason for Consult:  psych med management for depression and OCD Referring Physician:  Katheren Shams, DO   [name] is an 59 y.o. male. Total Time spent with patient: 45 minutes  Assessment: AXIS I:  Major Depression, Recurrent severe and Obsessive Compulsive Disorder AXIS II:  Deferred AXIS III:   Past Medical History  Diagnosis Date  . Seizures   . Hypertension   . GERD (gastroesophageal reflux disease)   . Dyslipidemia   . Schizoaffective disorder   . Depression   . Anxiety   . MRSA carrier    AXIS IV:  other psychosocial or environmental problems, problems related to social environment and problems with primary support group AXIS V:  41-50 serious symptoms  Plan:  Case discussed with Katheren Shams, DO Refer to out patient psychiatric treatment at [name] when medically stable Continue Seroquel 400 mg PO BID, Klonopin 0.5 mg PO BID Continue Amitriptyline 300 mg PO Qhs and Rozerem 8 mg PO Qhs No evidence of imminent risk to self or others at present.   Patient does not meet criteria for psychiatric inpatient admission. Supportive therapy provided about ongoing stressors. Discussed crisis plan, support from social network, calling 911, coming to the Emergency Department, and calling Suicide Hotline.  Appreciate psychiatric consultation and we sign off at this time Please contact [phone] or [phone] if needs further assistance   Subjective:   [name] is a 59 y.o. male patient admitted with AMS and needs psych medication management.  HPI:  [name] is a 59 y.o. male admitted with increased confusion and fever. Patient seen and chart reviewed and case discussed. Patient stated that he has been compliant with his psych medications as prescribed and clearly informed that he has no schizophrenia or schizoaffective disorder. He has been diagnosed with depression and obsessive compulsive disorder and  takes higher doses of his medication because he was considered high metabolizer of psych medications. He is interested in genetic testing if available. Patient denied current symptoms of depression, anxiety and psychosis. He has no suicide or homicide ideation, intention or plans. Patient was taking all his medication at night time but he is willing to take his medication with split doses because his medication may be causing sedation even though not making to sleep. Patient is known to this provider from his [facility] admission in May 2015.    Interval history: Patient appeared sitting in his bed and eating his meal without distress. Patient reported he has been taking his medication as prescribed but questions if staff was following the directions given to them. Patient was assured that he will be receiving medication as prescribed yesterday. I have reviewed all his medication changes made since yesterday. Patient is in agreement with current medication regimen without any further questions. Patient continued to deny symptoms of depression, anxiety/panic episodes and psychosis. Patient endorses having an episode of seizure last night and following up by neurology who were adjusting his antiepileptic medication as clinically required. Patient has no safety concerns and contract for safety during this hospitalization.   Medical history: [name] presented with fever and confusion, found to have HCAP on CXR. Pt is minimally alert, and sleeping soundly throughout my exam. Awakens to loud voice and sternal rub then falls back asleep. Per nurse, was talkative a little while ago, but still frequently fell asleep when not directly stimulated. ER notes suggest that pt was normal last night  but slept until 5pm today. Took all normal meds last night.   Past Psychiatric History: Past Medical History  Diagnosis Date  . Seizures   . Hypertension   . GERD (gastroesophageal reflux disease)   . Dyslipidemia   .  Schizoaffective disorder   . Depression   . Anxiety   . MRSA carrier     reports that he has never smoked. He has never used smokeless tobacco. He reports that he does not drink alcohol or use illicit drugs. Family History  Problem Relation Age of Onset  . Stroke Father     Living at 49  . Stroke Mother     Stroke in late 48's. Died in her 19's     Living Arrangements: Spouse/significant other   Abuse/Neglect [facility]) Physical Abuse: Denies Verbal Abuse: Denies Sexual Abuse: Denies Allergies:   Allergies  Allergen Reactions  . Codeine Nausea And Vomiting  . Sulfa Antibiotics Nausea And Vomiting    ACT Assessment Complete:  NO Objective: Blood pressure 130/78, pulse 92, temperature 98.2 F (36.8 C), temperature source Oral, resp. rate 24, height 5' 11"  (1.803 m), weight 83.9 kg (184 lb 15.5 oz), SpO2 98 %.Body mass index is 25.81 kg/(m^2). Results for orders placed or performed during the hospital encounter of 01/02/15 (from the past 72 hour(s))  Comprehensive metabolic panel     Status: Abnormal   Collection Time: 01/02/15  6:56 PM  Result Value Ref Range   Sodium 139 135 - 145 mmol/L    Comment: Please note change in reference range.   Potassium 4.1 3.5 - 5.1 mmol/L    Comment: Please note change in reference range.   Chloride 104 96 - 112 mEq/L   CO2 25 19 - 32 mmol/L   Glucose, Bld 120 (H) 70 - 99 mg/dL   BUN 13 6 - 23 mg/dL   Creatinine, Ser 0.94 0.50 - 1.35 mg/dL   Calcium 9.0 8.4 - 10.5 mg/dL   Total Protein 7.0 6.0 - 8.3 g/dL   Albumin 4.0 3.5 - 5.2 g/dL   AST 22 0 - 37 U/L   ALT 14 0 - 53 U/L   Alkaline Phosphatase 101 39 - 117 U/L   Total Bilirubin 0.4 0.3 - 1.2 mg/dL   GFR calc non Af Amer 89 (L) >90 mL/min   GFR calc Af Amer >90 >90 mL/min    Comment: (NOTE) The eGFR has been calculated using the CKD EPI equation. This calculation has not been validated in all clinical situations. eGFR's persistently <90 mL/min signify possible Chronic  Kidney Disease.    Anion gap 10 5 - 15  Culture, blood (routine x 2)     Status: None (Preliminary result)   Collection Time: 01/02/15  6:56 PM  Result Value Ref Range   Specimen Description BLOOD RIGHT ARM    Special Requests BOTTLES DRAWN AEROBIC AND ANAEROBIC 5CC    Culture             BLOOD CULTURE RECEIVED NO GROWTH TO DATE CULTURE WILL BE HELD FOR 5 DAYS BEFORE ISSUING A FINAL NEGATIVE REPORT Performed at [facility]    Report Status PENDING   I-Stat CG4 Lactic Acid, ED     Status: Abnormal   Collection Time: 01/02/15  7:05 PM  Result Value Ref Range   Lactic Acid, Venous 3.11 (H) 0.5 - 2.2 mmol/L  Culture, blood (routine x 2)     Status: None (Preliminary result)   Collection Time: 01/02/15  7:14  PM  Result Value Ref Range   Specimen Description BLOOD R HAND    Special Requests BOTTLES DRAWN AEROBIC AND ANAEROBIC 3ML    Culture             BLOOD CULTURE RECEIVED NO GROWTH TO DATE CULTURE WILL BE HELD FOR 5 DAYS BEFORE ISSUING A FINAL NEGATIVE REPORT Performed at [facility]    Report Status PENDING   Phenytoin level, total     Status: None   Collection Time: 01/02/15  7:20 PM  Result Value Ref Range   Phenytoin Lvl 10.4 10.0 - 20.0 ug/mL    Comment: Performed at [facility]  Urinalysis, Routine w reflex microscopic     Status: Abnormal   Collection Time: 01/02/15  7:33 PM  Result Value Ref Range   Color, Urine YELLOW YELLOW   APPearance CLEAR CLEAR   Specific Gravity, Urine 1.022 1.005 - 1.030   pH 6.5 5.0 - 8.0   Glucose, UA 100 (A) NEGATIVE mg/dL   Hgb urine dipstick NEGATIVE NEGATIVE   Bilirubin Urine NEGATIVE NEGATIVE   Ketones, ur NEGATIVE NEGATIVE mg/dL   Protein, ur NEGATIVE NEGATIVE mg/dL   Urobilinogen, UA 0.2 0.0 - 1.0 mg/dL   Nitrite NEGATIVE NEGATIVE   Leukocytes, UA NEGATIVE NEGATIVE    Comment: MICROSCOPIC NOT DONE ON URINES WITH NEGATIVE PROTEIN, BLOOD, LEUKOCYTES, NITRITE, OR GLUCOSE <1000 mg/dL.  Urine culture      Status: None   Collection Time: 01/02/15  7:33 PM  Result Value Ref Range   Specimen Description URINE, CATHETERIZED    Special Requests NONE    Colony Count NO GROWTH Performed at [facility]     Culture NO GROWTH Performed at [facility]     Report Status 01/03/2015 FINAL   CBC     Status: Abnormal   Collection Time: 01/02/15  7:50 PM  Result Value Ref Range   WBC 16.6 (H) 4.0 - 10.5 K/uL   RBC 4.15 (L) 4.22 - 5.81 MIL/uL   Hemoglobin 13.8 13.0 - 17.0 g/dL   HCT 39.6 39.0 - 52.0 %   MCV 95.4 78.0 - 100.0 fL   MCH 33.3 26.0 - 34.0 pg   MCHC 34.8 30.0 - 36.0 g/dL   RDW 14.6 11.5 - 15.5 %   Platelets 184 150 - 400 K/uL  Differential     Status: Abnormal   Collection Time: 01/02/15  7:50 PM  Result Value Ref Range   Neutrophils Relative % 90 (H) 43 - 77 %   Neutro Abs 14.9 (H) 1.7 - 7.7 K/uL   Lymphocytes Relative 7 (L) 12 - 46 %   Lymphs Abs 1.1 0.7 - 4.0 K/uL   Monocytes Relative 3 3 - 12 %   Monocytes Absolute 0.6 0.1 - 1.0 K/uL   Eosinophils Relative 0 0 - 5 %   Eosinophils Absolute 0.0 0.0 - 0.7 K/uL   Basophils Relative 0 0 - 1 %   Basophils Absolute 0.0 0.0 - 0.1 K/uL  CBG monitoring, ED     Status: Abnormal   Collection Time: 01/02/15 10:40 PM  Result Value Ref Range   Glucose-Capillary 111 (H) 70 - 99 mg/dL  Basic metabolic panel     Status: Abnormal   Collection Time: 01/03/15  1:07 AM  Result Value Ref Range   Sodium 142 135 - 145 mmol/L    Comment: Please note change in reference range.   Potassium 3.9 3.5 - 5.1 mmol/L  Comment: Please note change in reference range.   Chloride 114 (H) 96 - 112 mEq/L   CO2 24 19 - 32 mmol/L   Glucose, Bld 104 (H) 70 - 99 mg/dL   BUN 9 6 - 23 mg/dL   Creatinine, Ser 0.90 0.50 - 1.35 mg/dL   Calcium 8.0 (L) 8.4 - 10.5 mg/dL   GFR calc non Af Amer >90 >90 mL/min   GFR calc Af Amer >90 >90 mL/min    Comment: (NOTE) The eGFR has been calculated using the CKD EPI equation. This calculation has not been  validated in all clinical situations. eGFR's persistently <90 mL/min signify possible Chronic Kidney Disease.    Anion gap 4 (L) 5 - 15  CBC     Status: Abnormal   Collection Time: 01/03/15  1:07 AM  Result Value Ref Range   WBC 10.7 (H) 4.0 - 10.5 K/uL   RBC 3.78 (L) 4.22 - 5.81 MIL/uL   Hemoglobin 12.4 (L) 13.0 - 17.0 g/dL   HCT 36.5 (L) 39.0 - 52.0 %   MCV 96.6 78.0 - 100.0 fL   MCH 32.8 26.0 - 34.0 pg   MCHC 34.0 30.0 - 36.0 g/dL   RDW 14.7 11.5 - 15.5 %   Platelets 176 150 - 400 K/uL  Blood gas, arterial     Status: Abnormal   Collection Time: 01/03/15  1:23 AM  Result Value Ref Range   O2 Content 3.0 L/min   Delivery systems NASAL CANNULA    pH, Arterial 7.305 (L) 7.350 - 7.450   pCO2 arterial 47.6 (H) 35.0 - 45.0 mmHg   pO2, Arterial 107.0 (H) 80.0 - 100.0 mmHg   Bicarbonate 23.0 20.0 - 24.0 mEq/L   TCO2 24.4 0 - 100 mmol/L   Acid-base deficit 2.4 (H) 0.0 - 2.0 mmol/L   O2 Saturation 97.3 %   Patient temperature 98.6    Collection site RIGHT RADIAL    Drawn by [id number]    Sample type ARTERIAL DRAW    Allens test (pass/fail) PASS PASS  MRSA PCR Screening     Status: None   Collection Time: 01/03/15  2:03 AM  Result Value Ref Range   MRSA by PCR NEGATIVE NEGATIVE    Comment:        The GeneXpert MRSA Assay (FDA approved for NASAL specimens only), is one component of a comprehensive MRSA colonization surveillance program. It is not intended to diagnose MRSA infection nor to guide or monitor treatment for MRSA infections.   Influenza panel by PCR (type A & B, H1N1)     Status: None   Collection Time: 01/03/15  2:44 AM  Result Value Ref Range   Influenza A By PCR NEGATIVE NEGATIVE   Influenza B By PCR NEGATIVE NEGATIVE   H1N1 flu by pcr NOT DETECTED NOT DETECTED    Comment:        The Xpert Flu assay (FDA approved for nasal aspirates or washes and nasopharyngeal swab specimens), is intended as an aid in the diagnosis of influenza and should not be used  as a sole basis for treatment.   Urine rapid drug screen (hosp performed)     Status: Abnormal   Collection Time: 01/03/15  2:44 AM  Result Value Ref Range   Opiates NONE DETECTED NONE DETECTED   Cocaine NONE DETECTED NONE DETECTED   Benzodiazepines POSITIVE (A) NONE DETECTED   Amphetamines NONE DETECTED NONE DETECTED   Tetrahydrocannabinol NONE DETECTED NONE DETECTED   Barbiturates NONE DETECTED  NONE DETECTED    Comment:        DRUG SCREEN FOR MEDICAL PURPOSES ONLY.  IF CONFIRMATION IS NEEDED FOR ANY PURPOSE, NOTIFY LAB WITHIN 5 DAYS.        LOWEST DETECTABLE LIMITS FOR URINE DRUG SCREEN Drug Class       Cutoff (ng/mL) Amphetamine      1000 Barbiturate      200 Benzodiazepine   915 Tricyclics       056 Opiates          300 Cocaine          300 THC              50   Lactic acid, plasma     Status: None   Collection Time: 01/03/15  2:55 AM  Result Value Ref Range   Lactic Acid, Venous 1.3 0.5 - 2.2 mmol/L  Ammonia     Status: Abnormal   Collection Time: 01/03/15  4:19 AM  Result Value Ref Range   Ammonia 50 (H) 11 - 32 umol/L    Comment: Please note change in reference range.  TSH     Status: None   Collection Time: 01/03/15  4:38 AM  Result Value Ref Range   TSH 1.559 0.350 - 4.500 uIU/mL  Vancomycin, trough     Status: None   Collection Time: 01/04/15 11:58 AM  Result Value Ref Range   Vancomycin Tr 19.1 10.0 - 20.0 ug/mL   Labs are reviewed and are pertinent for benzo's and lactic acidosis.  Current Facility-Administered Medications  Medication Dose Route Frequency Provider Last Rate Last Dose  . 0.9 %  sodium chloride infusion   Intravenous Continuous Leeanne Rio, MD 125 mL/hr at 01/04/15 0600    . acetaminophen (TYLENOL) tablet 650 mg  650 mg Oral Q6H PRN Leeanne Rio, MD       Or  . acetaminophen (TYLENOL) suppository 650 mg  650 mg Rectal Q6H PRN Leeanne Rio, MD      . amitriptyline (ELAVIL) tablet 300 mg  300 mg Oral QHS Durward Parcel, MD   300 mg at 01/03/15 2051  . amoxicillin-clavulanate (AUGMENTIN) 875-125 MG per tablet 1 tablet  1 tablet Oral Q12H [place] N Rumley, DO      . clonazePAM (KLONOPIN) tablet 0.5 mg  0.5 mg Oral BID Durward Parcel, MD   0.5 mg at 97/94/80 1655  . folic acid (FOLVITE) tablet 1 mg  1 mg Oral Daily Katheren Shams, DO   1 mg at 01/04/15 1148  . heparin injection 5,000 Units  5,000 Units Subcutaneous 3 times per day Katheren Shams, DO   5,000 Units at 01/04/15 0554  . lacosamide (VIMPAT) tablet 100 mg  100 mg Oral BID Lupita Dawn, MD      . LORazepam (ATIVAN) tablet 1 mg  1 mg Oral Q6H PRN Katheren Shams, DO       Or  . LORazepam (ATIVAN) injection 1 mg  1 mg Intravenous Q6H PRN Katheren Shams, DO      . multivitamin with minerals tablet 1 tablet  1 tablet Oral Daily Katheren Shams, DO   1 tablet at 01/04/15 1148  . phenytoin (DILANTIN) ER capsule 200 mg  200 mg Oral QHS Roland Rack, MD   200 mg at 01/03/15 2051  . polyethylene glycol (MIRALAX / GLYCOLAX) packet 17 g  17 g Oral Daily Lavon Paganini, MD   17 g  at 01/04/15 1030  . QUEtiapine (SEROQUEL) tablet 400 mg  400 mg Oral BID Durward Parcel, MD   400 mg at 01/04/15 1029  . ramelteon (ROZEREM) tablet 8 mg  8 mg Oral QHS Durward Parcel, MD   8 mg at 01/03/15 2053  . senna (SENOKOT) tablet 8.6 mg  1 tablet Oral Daily Lavon Paganini, MD   8.6 mg at 01/04/15 1030  . sodium chloride 0.9 % injection 3 mL  3 mL Intravenous Q12H Leeanne Rio, MD   3 mL at 01/04/15 1034  . thiamine (VITAMIN B-1) tablet 100 mg  100 mg Oral Daily Katheren Shams, DO   100 mg at 01/04/15 1148   Or  . thiamine (B-1) injection 100 mg  100 mg Intravenous Daily Katheren Shams, DO        Psychiatric Specialty Exam: Physical Exam  ROS  Blood pressure 130/78, pulse 92, temperature 98.2 F (36.8 C), temperature source Oral, resp. rate 24, height 5' 11"  (1.803 m), weight 83.9 kg (184 lb 15.5 oz), SpO2 98  %.Body mass index is 25.81 kg/(m^2).  General Appearance: Casual  Eye Contact::  Good  Speech:  Clear and Coherent  Volume:  Normal  Mood:  Anxious  Affect:  Appropriate and Congruent  Thought Process:  Coherent and Goal Directed  Orientation:  Full (Time, Place, and Person)  Thought Content:  WDL  Suicidal Thoughts:  No  Homicidal Thoughts:  No  Memory:  Immediate;   Good Recent;   Fair  Judgement:  Fair  Insight:  Good  Psychomotor Activity:  Decreased  Concentration:  Good  Recall:  Good  Fund of Knowledge:Good  Language: Good  Akathisia:  NA  Handed:  Right  AIMS (if indicated):     Assets:  Communication Skills Desire for Improvement Financial Resources/Insurance Housing Leisure Time Resilience Social Support  Sleep:      Musculoskeletal: Strength & Muscle Tone: decreased Gait & Station: unable to stand Patient leans: N/A  Treatment Plan Summary: Daily contact with patient to assess and evaluate symptoms and progress in treatment Medication management  Continue Seroquel 400 mg PO BID, Klonopin 0.5 mg PO BID for anxiety, Amitriptyline 300 mg PO Qhs for OCD and Rozerem 8 mg PO Qhs for insomnia   Suetta Hoffmeister,JANARDHAHA R. 01/04/2015 1:42 PM

## 2015-01-04 NOTE — Consult Note (Signed)
Pt was alert and very open about talking about his issues.  He has many mental issues that he was open to discuss; anxiety, depression, and seizure disorder.  He is interested in getting information on support groups for people suffering from mental illnesses.  He asked for prayer for his issues.  Gave him some information on where to locate support groups.  Shared with him that he is not alone and that he has resources and people available in his life to assist him with any problems.  He identified several people that would be able to assist him; as he is single and says immediate family is not available/able to assist.  Drue Dun, [place] Intern 01/04/2015, 2:32 PM

## 2015-01-04 NOTE — Progress Notes (Signed)
Report called to [place] on 6E.

## 2015-01-04 NOTE — Progress Notes (Signed)
Family Medicine Teaching Service Daily Progress Note Intern Pager: [phone]  Patient name: [name] Medical record number: [id number] Date of birth: Apr 26, 1956 Age: 59 y.o. Gender: male  Primary Care Provider: Chrisandra Netters, MD Consultants: neuro, Psych Code Status: Full  Pt Overview and Major Events to Date:  1/18: Admitted for AMS  Assessment and Plan: [name] is a 59 y.o. male presenting with altered mental status, found to have HCAP meeting sepsis criteria. PMH is significant for seizures, recent frequent falls, HTN.  # Sepsis secondary to HCAP: CXR with obvious LLL infiltrate diagnostic for PNA. Recent admission qualifies pt for HCAP. Meeting criteria for sepsis with elevated lactic acid of 3.3, tachycardia, fever, leukocytosis, and source.  - continue vanc/zosyn per pharmacy - may be able to deescalate today to PO Levaquin - O2 prn - monitor vitals closely - improved with exception of tachypnea - BCx x2 (1/17) - NGTD - holding home beta blocker (presumably 2/2 hypotension) - may be able to restart today - lactic acid improved to 1.3 - WBC 10.7, down from 16.6 - EKG unremarkable  # AMS: Improving. likely related to sepsis, pt known to be altered upon admission in the past. CT head no evidence of infarct or hemorrhage. UDS positive for Benzos.  - avoid sedating meds - monitor for improvement in mental status  -frequent neuro checks - ABG: pH 7.3, CO2 47.6, bicarb 23 - ammonia mildly elevated at 50  # Seizure d/o: Reports seizure activity yesterday - seizure precautions - neuro consulted; appreciate recs - Dilantin 200 mg daily at bedtime and Vimpat 100 mg twice a day (home dose 50 qAM and 100 qPM, will clarify with neuro today)  # Psych:  -psych was consulted for AMS and help with psych meds -patient's meds adjusted as such: Seroquel 400 mg PO BID, Klonopin 0.5 mg PO BID, Amitriptyline 300 mg PO Qhs and Rozerem 8 mg PO Qhs  Constipation: Reports his  "descending colon hurts because of large stool burden." - Miralax and senna  FEN/GI: heart healthy diet, SLIV Prophylaxis: hep subq  Disposition: Continue current management; pending improvement. Transfer from SDU to floor today  Subjective:  MS improving. Reports likely seizure yesterday with blacking out and urinary incontinence. Denies CP, SOB.  Reports constipation of descending colon.  Objective: Temp:  [97.6 F (36.4 C)-98.6 F (37 C)] 97.6 F (36.4 C) (01/19 0700) Pulse Rate:  [91-103] 91 (01/19 0420) Resp:  [18-39] 39 (01/19 0420) BP: (121-140)/(61-79) 121/73 mmHg (01/19 0420) SpO2:  [96 %-98 %] 96 % (01/19 0420) Physical Exam: General: NAD, lying in bed.  HEENT: NCAT, MMM, PERRL Cardiovascular: RRR, no m/r/g appricated Respiratory: CTAB, no w/r/c. Comfortable work of breathing.  Abdomen: soft, NTTP, normal bowel sounds, no masses or organomegaly Extremities: No appreciable lower extremity edema bilaterally, calves without tenderness, erythema, or warmth Neuro: CNs grossly intact, strength 5/5, sensation grossly intact, A&Ox3 (doesn't know exact date).   Laboratory: No results found for this or any previous visit (from the past 24 hour(s)).  Imaging/Diagnostic Tests: Ct Head Wo Contrast (1/18):  1. Prominent density of the basilar which could be artifact or clot. If the patient's deficit could be explained by posterior circulation ischemia, angiographic follow-up is recommended. 2. No evidence of infarct or hemorrhage.     Dg Chest Port 1 View (1/17): 1. Patchy airspace opacity in the left mid to lower lung consistent with pneumonia or aspiration pneumonitis. No other acute finding.     Lavon Paganini, MD 01/04/2015,  9:18 AM PGY-1, [place] Intern pager: [phone], text pages welcome

## 2015-01-05 LAB — BASIC METABOLIC PANEL
Anion gap: 10 (ref 5–15)
BUN: 8 mg/dL (ref 6–23)
CO2: 22 mmol/L (ref 19–32)
Calcium: 8.9 mg/dL (ref 8.4–10.5)
Chloride: 110 mEq/L (ref 96–112)
Creatinine, Ser: 0.91 mg/dL (ref 0.50–1.35)
GFR calc Af Amer: >90 mL/min (ref >90)
GFR calc non Af Amer: >90 mL/min (ref >90)
Glucose, Bld: 94 mg/dL (ref 70–99)
Potassium: 4.5 mmol/L (ref 3.5–5.1)
Sodium: 142 mmol/L (ref 135–145)

## 2015-01-05 LAB — CBC
HCT: 38.4 % — ABNORMAL LOW (ref 39.0–52.0)
Hemoglobin: 13.3 g/dL (ref 13.0–17.0)
MCH: 32.3 pg (ref 26.0–34.0)
MCHC: 34.6 g/dL (ref 30.0–36.0)
MCV: 93.2 fL (ref 78.0–100.0)
Platelets: 178 10*3/uL (ref 150–400)
RBC: 4.12 MIL/uL — ABNORMAL LOW (ref 4.22–5.81)
RDW: 14.3 % (ref 11.5–15.5)
WBC: 6.3 10*3/uL (ref 4.0–10.5)

## 2015-01-05 MED ORDER — POLYETHYLENE GLYCOL 3350 17 G PO PACK
17.0000 g | PACK | Freq: Two times a day (BID) | ORAL | Status: DC
Start: 2015-01-05 — End: 2015-01-06
  Administered 2015-01-05 – 2015-01-06 (×2): 17 g via ORAL
  Filled 2015-01-05 (×3): qty 1

## 2015-01-05 NOTE — Consult Note (Signed)
Psychiatry Consult follow-up note  Reason for Consult:  Psych med management for depression and OCD Referring Physician:  Katheren Shams, DO   [name] is an 59 y.o. male. Total Time spent with patient: 45 minutes  Assessment: AXIS I:  Major Depression, Recurrent severe and Obsessive Compulsive Disorder AXIS II:  Deferred AXIS III:   Past Medical History  Diagnosis Date  . Seizures   . Hypertension   . GERD (gastroesophageal reflux disease)   . Dyslipidemia   . Schizoaffective disorder   . Depression   . Anxiety   . MRSA carrier    AXIS IV:  other psychosocial or environmental problems, problems related to social environment and problems with primary support group AXIS V:  41-50 serious symptoms  Plan:  Case discussed with Katheren Shams, DO Refer to out patient psychiatric treatment at [name] when medically stable Continue Seroquel 400 mg PO BID, Klonopin 0.5 mg PO BID Continue Amitriptyline 300 mg PO Qhs and Rozerem 8 mg PO Qhs No evidence of imminent risk to self or others at present.   Patient does not meet criteria for psychiatric inpatient admission. Supportive therapy provided about ongoing stressors. Discussed crisis plan, support from social network, calling 911, coming to the Emergency Department, and calling Suicide Hotline.  Appreciate psychiatric consultation and we sign off at this time Please contact [phone] or [phone] if needs further assistance   Subjective:   [name] is a 59 y.o. male patient admitted with AMS and needs psych medication management.  HPI:  [name] is a 59 y.o. male admitted with increased confusion and fever. Patient seen and chart reviewed and case discussed. Patient stated that he has been compliant with his psych medications as prescribed and clearly informed that he has no schizophrenia or schizoaffective disorder. He has been diagnosed with depression and obsessive compulsive disorder and  takes higher doses of his medication because he was considered high metabolizer of psych medications. He is interested in genetic testing if available. Patient denied current symptoms of depression, anxiety and psychosis. He has no suicide or homicide ideation, intention or plans. Patient was taking all his medication at night time but he is willing to take his medication with split doses because his medication may be causing sedation even though not making to sleep. Patient is known to this provider from his [facility] admission in May 2015.    Interval history: Patient appeared lying on his bed and talking phone while entered into his room. He is able redirect and talk to this provider. He is calm and cooperative. He has been compliant with his medications and denied side effects. He wants to make sure staff is following the right medication instructions. Patient was assured that he will be receiving medication as prescribed yesterday. Patient is in agreement with current medication regimen without further questions. Patient has no symptoms of depression, anxiety/panic episodes and psychosis. Patient has no safety concerns and contract for safety during this hospitalization and willing to follow up with out patient psychiatry as scheduled.  Past Psychiatric History: Past Medical History  Diagnosis Date  . Seizures   . Hypertension   . GERD (gastroesophageal reflux disease)   . Dyslipidemia   . Schizoaffective disorder   . Depression   . Anxiety   . MRSA carrier     reports that he has never smoked. He has never used smokeless tobacco. He reports that he does not drink alcohol  or use illicit drugs. Family History  Problem Relation Age of Onset  . Stroke Father     Living at 82  . Stroke Mother     Stroke in late 71's. Died in her 71's     Living Arrangements: Spouse/significant other   Abuse/Neglect [facility] Physical Abuse: Denies Verbal Abuse: Denies Sexual Abuse: Denies Allergies:    Allergies  Allergen Reactions  . Codeine Nausea And Vomiting  . Sulfa Antibiotics Nausea And Vomiting    ACT Assessment Complete:  NO Objective: Blood pressure 138/85, pulse 97, temperature 98 F (36.7 C), temperature source Oral, resp. rate 18, height 5' 11" (1.803 m), weight 85.1 kg (187 lb 9.8 oz), SpO2 98 %.Body mass index is 26.18 kg/(m^2). Results for orders placed or performed during the hospital encounter of 01/02/15 (from the past 72 hour(s))  Comprehensive metabolic panel     Status: Abnormal   Collection Time: 01/02/15  6:56 PM  Result Value Ref Range   Sodium 139 135 - 145 mmol/L    Comment: Please note change in reference range.   Potassium 4.1 3.5 - 5.1 mmol/L    Comment: Please note change in reference range.   Chloride 104 96 - 112 mEq/L   CO2 25 19 - 32 mmol/L   Glucose, Bld 120 (H) 70 - 99 mg/dL   BUN 13 6 - 23 mg/dL   Creatinine, Ser 0.94 0.50 - 1.35 mg/dL   Calcium 9.0 8.4 - 10.5 mg/dL   Total Protein 7.0 6.0 - 8.3 g/dL   Albumin 4.0 3.5 - 5.2 g/dL   AST 22 0 - 37 U/L   ALT 14 0 - 53 U/L   Alkaline Phosphatase 101 39 - 117 U/L   Total Bilirubin 0.4 0.3 - 1.2 mg/dL   GFR calc non Af Amer 89 (L) >90 mL/min   GFR calc Af Amer >90 >90 mL/min    Comment: (NOTE) The eGFR has been calculated using the CKD EPI equation. This calculation has not been validated in all clinical situations. eGFR's persistently <90 mL/min signify possible Chronic Kidney Disease.    Anion gap 10 5 - 15  Culture, blood (routine x 2)     Status: None (Preliminary result)   Collection Time: 01/02/15  6:56 PM  Result Value Ref Range   Specimen Description BLOOD RIGHT ARM    Special Requests BOTTLES DRAWN AEROBIC AND ANAEROBIC 5CC    Culture             BLOOD CULTURE RECEIVED NO GROWTH TO DATE CULTURE WILL BE HELD FOR 5 DAYS BEFORE ISSUING A FINAL NEGATIVE REPORT Performed at [facility]    Report Status PENDING   I-Stat CG4 Lactic Acid, ED     Status: Abnormal    Collection Time: 01/02/15  7:05 PM  Result Value Ref Range   Lactic Acid, Venous 3.11 (H) 0.5 - 2.2 mmol/L  Culture, blood (routine x 2)     Status: None (Preliminary result)   Collection Time: 01/02/15  7:14 PM  Result Value Ref Range   Specimen Description BLOOD R HAND    Special Requests BOTTLES DRAWN AEROBIC AND ANAEROBIC 3ML    Culture             BLOOD CULTURE RECEIVED NO GROWTH TO DATE CULTURE WILL BE HELD FOR 5 DAYS BEFORE ISSUING A FINAL NEGATIVE REPORT Performed at [facility]    Report Status PENDING   Phenytoin level, total     Status: None  Collection Time: 01/02/15  7:20 PM  Result Value Ref Range   Phenytoin Lvl 10.4 10.0 - 20.0 ug/mL    Comment: Performed at [facility]  Urinalysis, Routine w reflex microscopic     Status: Abnormal   Collection Time: 01/02/15  7:33 PM  Result Value Ref Range   Color, Urine YELLOW YELLOW   APPearance CLEAR CLEAR   Specific Gravity, Urine 1.022 1.005 - 1.030   pH 6.5 5.0 - 8.0   Glucose, UA 100 (A) NEGATIVE mg/dL   Hgb urine dipstick NEGATIVE NEGATIVE   Bilirubin Urine NEGATIVE NEGATIVE   Ketones, ur NEGATIVE NEGATIVE mg/dL   Protein, ur NEGATIVE NEGATIVE mg/dL   Urobilinogen, UA 0.2 0.0 - 1.0 mg/dL   Nitrite NEGATIVE NEGATIVE   Leukocytes, UA NEGATIVE NEGATIVE    Comment: MICROSCOPIC NOT DONE ON URINES WITH NEGATIVE PROTEIN, BLOOD, LEUKOCYTES, NITRITE, OR GLUCOSE <1000 mg/dL.  Urine culture     Status: None   Collection Time: 01/02/15  7:33 PM  Result Value Ref Range   Specimen Description URINE, CATHETERIZED    Special Requests NONE    Colony Count NO GROWTH Performed at [facility]     Culture NO GROWTH Performed at [facility]     Report Status 01/03/2015 FINAL   CBC     Status: Abnormal   Collection Time: 01/02/15  7:50 PM  Result Value Ref Range   WBC 16.6 (H) 4.0 - 10.5 K/uL   RBC 4.15 (L) 4.22 - 5.81 MIL/uL   Hemoglobin 13.8 13.0 - 17.0 g/dL   HCT 39.6 39.0 - 52.0 %   MCV  95.4 78.0 - 100.0 fL   MCH 33.3 26.0 - 34.0 pg   MCHC 34.8 30.0 - 36.0 g/dL   RDW 14.6 11.5 - 15.5 %   Platelets 184 150 - 400 K/uL  Differential     Status: Abnormal   Collection Time: 01/02/15  7:50 PM  Result Value Ref Range   Neutrophils Relative % 90 (H) 43 - 77 %   Neutro Abs 14.9 (H) 1.7 - 7.7 K/uL   Lymphocytes Relative 7 (L) 12 - 46 %   Lymphs Abs 1.1 0.7 - 4.0 K/uL   Monocytes Relative 3 3 - 12 %   Monocytes Absolute 0.6 0.1 - 1.0 K/uL   Eosinophils Relative 0 0 - 5 %   Eosinophils Absolute 0.0 0.0 - 0.7 K/uL   Basophils Relative 0 0 - 1 %   Basophils Absolute 0.0 0.0 - 0.1 K/uL  CBG monitoring, ED     Status: Abnormal   Collection Time: 01/02/15 10:40 PM  Result Value Ref Range   Glucose-Capillary 111 (H) 70 - 99 mg/dL  Basic metabolic panel     Status: Abnormal   Collection Time: 01/03/15  1:07 AM  Result Value Ref Range   Sodium 142 135 - 145 mmol/L    Comment: Please note change in reference range.   Potassium 3.9 3.5 - 5.1 mmol/L    Comment: Please note change in reference range.   Chloride 114 (H) 96 - 112 mEq/L   CO2 24 19 - 32 mmol/L   Glucose, Bld 104 (H) 70 - 99 mg/dL   BUN 9 6 - 23 mg/dL   Creatinine, Ser 0.90 0.50 - 1.35 mg/dL   Calcium 8.0 (L) 8.4 - 10.5 mg/dL   GFR calc non Af Amer >90 >90 mL/min   GFR calc Af Amer >90 >90 mL/min    Comment: (NOTE)  The eGFR has been calculated using the CKD EPI equation. This calculation has not been validated in all clinical situations. eGFR's persistently <90 mL/min signify possible Chronic Kidney Disease.    Anion gap 4 (L) 5 - 15  CBC     Status: Abnormal   Collection Time: 01/03/15  1:07 AM  Result Value Ref Range   WBC 10.7 (H) 4.0 - 10.5 K/uL   RBC 3.78 (L) 4.22 - 5.81 MIL/uL   Hemoglobin 12.4 (L) 13.0 - 17.0 g/dL   HCT 36.5 (L) 39.0 - 52.0 %   MCV 96.6 78.0 - 100.0 fL   MCH 32.8 26.0 - 34.0 pg   MCHC 34.0 30.0 - 36.0 g/dL   RDW 14.7 11.5 - 15.5 %   Platelets 176 150 - 400 K/uL  Blood gas,  arterial     Status: Abnormal   Collection Time: 01/03/15  1:23 AM  Result Value Ref Range   O2 Content 3.0 L/min   Delivery systems NASAL CANNULA    pH, Arterial 7.305 (L) 7.350 - 7.450   pCO2 arterial 47.6 (H) 35.0 - 45.0 mmHg   pO2, Arterial 107.0 (H) 80.0 - 100.0 mmHg   Bicarbonate 23.0 20.0 - 24.0 mEq/L   TCO2 24.4 0 - 100 mmol/L   Acid-base deficit 2.4 (H) 0.0 - 2.0 mmol/L   O2 Saturation 97.3 %   Patient temperature 98.6    Collection site RIGHT RADIAL    Drawn by [id number]    Sample type ARTERIAL DRAW    Allens test (pass/fail) PASS PASS  MRSA PCR Screening     Status: None   Collection Time: 01/03/15  2:03 AM  Result Value Ref Range   MRSA by PCR NEGATIVE NEGATIVE    Comment:        The GeneXpert MRSA Assay (FDA approved for NASAL specimens only), is one component of a comprehensive MRSA colonization surveillance program. It is not intended to diagnose MRSA infection nor to guide or monitor treatment for MRSA infections.   Influenza panel by PCR (type A & B, H1N1)     Status: None   Collection Time: 01/03/15  2:44 AM  Result Value Ref Range   Influenza A By PCR NEGATIVE NEGATIVE   Influenza B By PCR NEGATIVE NEGATIVE   H1N1 flu by pcr NOT DETECTED NOT DETECTED    Comment:        The Xpert Flu assay (FDA approved for nasal aspirates or washes and nasopharyngeal swab specimens), is intended as an aid in the diagnosis of influenza and should not be used as a sole basis for treatment.   Urine rapid drug screen (hosp performed)     Status: Abnormal   Collection Time: 01/03/15  2:44 AM  Result Value Ref Range   Opiates NONE DETECTED NONE DETECTED   Cocaine NONE DETECTED NONE DETECTED   Benzodiazepines POSITIVE (A) NONE DETECTED   Amphetamines NONE DETECTED NONE DETECTED   Tetrahydrocannabinol NONE DETECTED NONE DETECTED   Barbiturates NONE DETECTED NONE DETECTED    Comment:        DRUG SCREEN FOR MEDICAL PURPOSES ONLY.  IF CONFIRMATION IS NEEDED FOR ANY  PURPOSE, NOTIFY LAB WITHIN 5 DAYS.        LOWEST DETECTABLE LIMITS FOR URINE DRUG SCREEN Drug Class       Cutoff (ng/mL) Amphetamine      1000 Barbiturate      200 Benzodiazepine   929 Tricyclics       574 Opiates  300 Cocaine          300 THC              50   Lactic acid, plasma     Status: None   Collection Time: 01/03/15  2:55 AM  Result Value Ref Range   Lactic Acid, Venous 1.3 0.5 - 2.2 mmol/L  Ammonia     Status: Abnormal   Collection Time: 01/03/15  4:19 AM  Result Value Ref Range   Ammonia 50 (H) 11 - 32 umol/L    Comment: Please note change in reference range.  TSH     Status: None   Collection Time: 01/03/15  4:38 AM  Result Value Ref Range   TSH 1.559 0.350 - 4.500 uIU/mL  Vancomycin, trough     Status: None   Collection Time: 01/04/15 11:58 AM  Result Value Ref Range   Vancomycin Tr 19.1 10.0 - 20.0 ug/mL  CBC     Status: Abnormal   Collection Time: 01/05/15  5:50 AM  Result Value Ref Range   WBC 6.3 4.0 - 10.5 K/uL   RBC 4.12 (L) 4.22 - 5.81 MIL/uL   Hemoglobin 13.3 13.0 - 17.0 g/dL   HCT 38.4 (L) 39.0 - 52.0 %   MCV 93.2 78.0 - 100.0 fL   MCH 32.3 26.0 - 34.0 pg   MCHC 34.6 30.0 - 36.0 g/dL   RDW 14.3 11.5 - 15.5 %   Platelets 178 150 - 400 K/uL  Basic metabolic panel     Status: None   Collection Time: 01/05/15  5:50 AM  Result Value Ref Range   Sodium 142 135 - 145 mmol/L    Comment: Please note change in reference range.   Potassium 4.5 3.5 - 5.1 mmol/L    Comment: Please note change in reference range.   Chloride 110 96 - 112 mEq/L   CO2 22 19 - 32 mmol/L   Glucose, Bld 94 70 - 99 mg/dL   BUN 8 6 - 23 mg/dL   Creatinine, Ser 0.91 0.50 - 1.35 mg/dL   Calcium 8.9 8.4 - 10.5 mg/dL   GFR calc non Af Amer >90 >90 mL/min   GFR calc Af Amer >90 >90 mL/min    Comment: (NOTE) The eGFR has been calculated using the CKD EPI equation. This calculation has not been validated in all clinical situations. eGFR's persistently <90 mL/min signify  possible Chronic Kidney Disease.    Anion gap 10 5 - 15   Labs are reviewed and are pertinent for benzo's and lactic acidosis.  Current Facility-Administered Medications  Medication Dose Route Frequency Provider Last Rate Last Dose  . 0.9 %  sodium chloride infusion   Intravenous Continuous Leeanne Rio, MD 125 mL/hr at 01/05/15 1013    . acetaminophen (TYLENOL) tablet 650 mg  650 mg Oral Q6H PRN Leeanne Rio, MD       Or  . acetaminophen (TYLENOL) suppository 650 mg  650 mg Rectal Q6H PRN Leeanne Rio, MD      . amitriptyline (ELAVIL) tablet 300 mg  300 mg Oral QHS Durward Parcel, MD   300 mg at 01/04/15 2133  . amoxicillin-clavulanate (AUGMENTIN) 875-125 MG per tablet 1 tablet  1 tablet Oral Q12H [place]oning N Rumley, DO   1 tablet at 01/05/15 1013  . bisacodyl (DULCOLAX) EC tablet 5 mg  5 mg Oral Daily PRN [facility], DO   5 mg at 01/04/15 1609  . clonazePAM (KLONOPIN)  tablet 0.5 mg  0.5 mg Oral BID Durward Parcel, MD   0.5 mg at 01/05/15 1013  . folic acid (FOLVITE) tablet 1 mg  1 mg Oral Daily Katheren Shams, DO   1 mg at 01/05/15 1013  . heparin injection 5,000 Units  5,000 Units Subcutaneous 3 times per day Katheren Shams, DO   5,000 Units at 01/05/15 1352  . lacosamide (VIMPAT) tablet 100 mg  100 mg Oral BID Lupita Dawn, MD   100 mg at 01/05/15 1013  . LORazepam (ATIVAN) tablet 1 mg  1 mg Oral Q6H PRN Katheren Shams, DO       Or  . LORazepam (ATIVAN) injection 1 mg  1 mg Intravenous Q6H PRN Katheren Shams, DO      . multivitamin with minerals tablet 1 tablet  1 tablet Oral Daily Katheren Shams, DO   1 tablet at 01/05/15 1013  . phenytoin (DILANTIN) ER capsule 200 mg  200 mg Oral QHS Roland Rack, MD   200 mg at 01/04/15 2133  . QUEtiapine (SEROQUEL) tablet 400 mg  400 mg Oral BID Durward Parcel, MD   400 mg at 01/05/15 1014  . ramelteon (ROZEREM) tablet 8 mg  8 mg Oral QHS Durward Parcel, MD   8 mg at  01/04/15 2134  . senna (SENOKOT) tablet 8.6 mg  1 tablet Oral Daily Lavon Paganini, MD   8.6 mg at 01/05/15 1013  . sodium chloride 0.9 % injection 3 mL  3 mL Intravenous Q12H Leeanne Rio, MD   3 mL at 01/05/15 1016  . thiamine (VITAMIN B-1) tablet 100 mg  100 mg Oral Daily Katheren Shams, DO   100 mg at 01/05/15 1013    Psychiatric Specialty Exam: Physical Exam  ROS  Blood pressure 138/85, pulse 97, temperature 98 F (36.7 C), temperature source Oral, resp. rate 18, height 5' 11" (1.803 m), weight 85.1 kg (187 lb 9.8 oz), SpO2 98 %.Body mass index is 26.18 kg/(m^2).  General Appearance: Casual  Eye Contact::  Good  Speech:  Clear and Coherent  Volume:  Normal  Mood:  Anxious  Affect:  Appropriate and Congruent  Thought Process:  Coherent and Goal Directed  Orientation:  Full (Time, Place, and Person)  Thought Content:  WDL  Suicidal Thoughts:  No  Homicidal Thoughts:  No  Memory:  Immediate;   Good Recent;   Fair  Judgement:  Fair  Insight:  Good  Psychomotor Activity:  Decreased  Concentration:  Good  Recall:  Good  Fund of Knowledge:Good  Language: Good  Akathisia:  NA  Handed:  Right  AIMS (if indicated):     Assets:  Communication Skills Desire for Improvement Financial Resources/Insurance Housing Leisure Time Resilience Social Support  Sleep:      Musculoskeletal: Strength & Muscle Tone: decreased Gait & Station: unable to stand Patient leans: N/A  Treatment Plan Summary: Daily contact with patient to assess and evaluate symptoms and progress in treatment Medication management  Continue Seroquel 400 mg PO BID, Klonopin 0.5 mg PO BID for anxiety, Amitriptyline 300 mg PO Qhs for OCD and Rozerem 8 mg PO Qhs for insomnia   ,JANARDHAHA R. 01/05/2015 2:47 PM

## 2015-01-05 NOTE — Progress Notes (Signed)
Family Medicine Teaching Service Daily Progress Note Intern Pager: [phone]  Patient name: [name] Medical record number: [id number] Date of birth: [birth year] Age: 59 y.o. Gender: male  Primary Care Provider: Chrisandra Netters, MD Consultants: neuro, Psych Code Status: Full  Pt Overview and Major Events to Date:  1/18: Admitted for AMS  Assessment and Plan: [name] is a 59 y.o. male presenting with altered mental status, found to have HCAP meeting sepsis criteria. PMH is significant for seizures, recent frequent falls, HTN.  # Sepsis secondary to HCAP: CXR with obvious LLL infiltrate diagnostic for PNA. Recent admission qualifies pt for HCAP. Meeting criteria for sepsis with elevated lactic acid of 3.3, tachycardia, fever, leukocytosis, and source.  - Continue Augmentin to finish 7 day course  - O2 prn - monitor vitals closely - improved with exception of tachypnea - BCx x2 (1/17) - NGTD - holding home beta blocker (presumably 2/2 hypotension) - may be able to restart today - lactic acid improved to 1.3 - WBC 6.3, down from 16.6 - EKG unremarkable  # AMS: Improving - still somewhat confused this AM. likely related to sepsis, pt known to be altered upon admission in the past. CT head no evidence of infarct or hemorrhage. UDS positive for Benzos.  - avoid sedating meds - monitor for improvement in mental status  -frequent neuro checks - ABG: pH 7.3, CO2 47.6, bicarb 23 - ammonia mildly elevated at 50  # Seizure d/o: Reports seizure activity yesterday  - seizure precautions - neuro consulted (signed off); appreciate recs  - Continue Dilantin 200 mg qhs and Vimpat 100 mg BID  # Psych:  -psych was consulted for AMS and help with psych meds -patient's meds adjusted as such: Seroquel 400 mg PO BID, Klonopin 0.5 mg PO BID, Amitriptyline 300 mg PO Qhs and Rozerem 8 mg PO Qhs  Constipation: Stable.  - Continue Miralax and senna  FEN/GI: heart healthy diet,  SLIV Prophylaxis: hep subq  Disposition: Continue current management; pending improvement in AMS, likely tomorrow  Subjective:  MS improving. Reports likely seizure yesterday with blacking out and urinary incontinence again. Denies CP, SOB.  Reports constipation.  Objective: Temp:  [97.4 F (36.3 C)-99.4 F (37.4 C)] 97.4 F (36.3 C) (01/20 0501) Pulse Rate:  [87-98] 88 (01/20 0501) Resp:  [18-24] 18 (01/20 0501) BP: (118-154)/(63-83) 154/83 mmHg (01/20 0501) SpO2:  [96 %-98 %] 97 % (01/20 0501) Weight:  [187 lb 9.8 oz (85.1 kg)] 187 lb 9.8 oz (85.1 kg) (01/19 2055) Physical Exam: General: NAD, lying in bed.  HEENT: NCAT, MMM, PERRL Cardiovascular: RRR, no m/r/g appricated Respiratory: CTAB, no w/r/c. Comfortable work of breathing.  Abdomen: soft, NTTP, normal bowel sounds, no masses or organomegaly Extremities: No appreciable lower extremity edema bilaterally, calves without tenderness, erythema, or warmth Neuro: CNs grossly intact, strength 5/5, sensation grossly intact, A&Ox2 (doesn't know month, but knows date and year), somewhat slurred speech  Laboratory: Results for orders placed or performed during the hospital encounter of 01/02/15 (from the past 24 hour(s))  Vancomycin, trough     Status: None   Collection Time: 01/04/15 11:58 AM  Result Value Ref Range   Vancomycin Tr 19.1 10.0 - 20.0 ug/mL  CBC     Status: Abnormal   Collection Time: 01/05/15  5:50 AM  Result Value Ref Range   WBC 6.3 4.0 - 10.5 K/uL   RBC 4.12 (L) 4.22 - 5.81 MIL/uL   Hemoglobin 13.3 13.0 - 17.0 g/dL   HCT  38.4 (L) 39.0 - 52.0 %   MCV 93.2 78.0 - 100.0 fL   MCH 32.3 26.0 - 34.0 pg   MCHC 34.6 30.0 - 36.0 g/dL   RDW 14.3 11.5 - 15.5 %   Platelets 178 150 - 400 K/uL  Basic metabolic panel     Status: None   Collection Time: 01/05/15  5:50 AM  Result Value Ref Range   Sodium 142 135 - 145 mmol/L   Potassium 4.5 3.5 - 5.1 mmol/L   Chloride 110 96 - 112 mEq/L   CO2 22 19 - 32 mmol/L    Glucose, Bld 94 70 - 99 mg/dL   BUN 8 6 - 23 mg/dL   Creatinine, Ser 0.91 0.50 - 1.35 mg/dL   Calcium 8.9 8.4 - 10.5 mg/dL   GFR calc non Af Amer >90 >90 mL/min   GFR calc Af Amer >90 >90 mL/min   Anion gap 10 5 - 15    Imaging/Diagnostic Tests: Ct Head Wo Contrast (1/18):  1. Prominent density of the basilar which could be artifact or clot. If the patient's deficit could be explained by posterior circulation ischemia, angiographic follow-up is recommended. 2. No evidence of infarct or hemorrhage.     Dg Chest Port 1 View (1/17): 1. Patchy airspace opacity in the left mid to lower lung consistent with pneumonia or aspiration pneumonitis. No other acute finding.     Lavon Paganini, MD 01/05/2015, 8:32 AM PGY-1, [place] Intern pager: [phone], text pages welcome

## 2015-01-06 ENCOUNTER — Other Ambulatory Visit: Payer: Self-pay | Admitting: Family Medicine

## 2015-01-06 DIAGNOSIS — IMO0001 Reserved for inherently not codable concepts without codable children: Secondary | ICD-10-CM | POA: Insufficient documentation

## 2015-01-06 MED ORDER — QUETIAPINE FUMARATE 400 MG PO TABS: 400.0000 mg | ORAL_TABLET | Freq: Two times a day (BID) | ORAL | Status: DC

## 2015-01-06 MED ORDER — BISACODYL 5 MG PO TBEC: 5.0000 mg | DELAYED_RELEASE_TABLET | Freq: Every day | ORAL | Status: DC | PRN

## 2015-01-06 MED ORDER — AMOXICILLIN-POT CLAVULANATE 875-125 MG PO TABS: 1.0000 | ORAL_TABLET | Freq: Two times a day (BID) | ORAL | Status: DC

## 2015-01-06 MED ORDER — LACOSAMIDE 100 MG PO TABS: 100.0000 mg | ORAL_TABLET | Freq: Two times a day (BID) | ORAL | Status: DC

## 2015-01-06 MED ORDER — SENNA 8.6 MG PO TABS: 1.0000 | ORAL_TABLET | Freq: Every day | ORAL | Status: DC

## 2015-01-06 MED ORDER — CLONAZEPAM 0.5 MG PO TABS: 0.5000 mg | ORAL_TABLET | Freq: Two times a day (BID) | ORAL | Status: DC

## 2015-01-06 NOTE — Discharge Instructions (Signed)
You were admitted for altered mental status.  We adjusted your psych and neurologic medications and this helped with confusion.  Your new medication regimen is as follows:  Amitriptyline 300mg  (2 tablets) daily at bedtime  Klonopin 0.5mg  (1 tablet) twice daily Phenytoin 200mg  (4 tablets) daily at bedtime Vimpat 100mg  twice daily Seroquel 400mg  (1 tablet) twice daily Ramelteon 8mg  (1 tablet) daily at bedtime    You were also treated for pneumonia.  You will finish your antibiotic course at home.  Take exactly as directed and finish entire course.   Pneumonia Pneumonia is an infection of the lungs.  CAUSES Pneumonia may be caused by bacteria or a virus. Usually, these infections are caused by breathing infectious particles into the lungs (respiratory tract). SIGNS AND SYMPTOMS   Cough.  Fever.  Chest pain.  Increased rate of breathing.  Wheezing.  Mucus production. DIAGNOSIS  If you have the common symptoms of pneumonia, your health care provider will typically confirm the diagnosis with a chest X-ray. The X-ray will show an abnormality in the lung (pulmonary infiltrate) if you have pneumonia. Other tests of your blood, urine, or sputum may be done to find the specific cause of your pneumonia. Your health care provider may also do tests (blood gases or pulse oximetry) to see how well your lungs are working. TREATMENT  Some forms of pneumonia may be spread to other people when you cough or sneeze. You may be asked to wear a mask before and during your exam. Pneumonia that is caused by bacteria is treated with antibiotic medicine. Pneumonia that is caused by the influenza virus may be treated with an antiviral medicine. Most other viral infections must run their course. These infections will not respond to antibiotics.  HOME CARE INSTRUCTIONS   Cough suppressants may be used if you are losing too much rest. However, coughing protects you by clearing your lungs. You should avoid  using cough suppressants if you can.  Your health care provider may have prescribed medicine if he or she thinks your pneumonia is caused by bacteria or influenza. Finish your medicine even if you start to feel better.  Your health care provider may also prescribe an expectorant. This loosens the mucus to be coughed up.  Take medicines only as directed by your health care provider.  Do not smoke. Smoking is a common cause of bronchitis and can contribute to pneumonia. If you are a smoker and continue to smoke, your cough may last several weeks after your pneumonia has cleared.  A cold steam vaporizer or humidifier in your room or home may help loosen mucus.  Coughing is often worse at night. Sleeping in a semi-upright position in a recliner or using a couple pillows under your head will help with this.  Get rest as you feel it is needed. Your body will usually let you know when you need to rest. PREVENTION A pneumococcal shot (vaccine) is available to prevent a common bacterial cause of pneumonia. This is usually suggested for:  People over 2 years old.  Patients on chemotherapy.  People with chronic lung problems, such as bronchitis or emphysema.  People with immune system problems. If you are over 65 or have a high risk condition, you may receive the pneumococcal vaccine if you have not received it before. In some countries, a routine influenza vaccine is also recommended. This vaccine can help prevent some cases of pneumonia.You may be offered the influenza vaccine as part of your care. If you smoke,  it is time to quit. You may receive instructions on how to stop smoking. Your health care provider can provide medicines and counseling to help you quit. SEEK MEDICAL CARE IF: You have a fever. SEEK IMMEDIATE MEDICAL CARE IF:   Your illness becomes worse. This is especially true if you are elderly or weakened from any other disease.  You cannot control your cough with suppressants  and are losing sleep.  You begin coughing up blood.  You develop pain which is getting worse or is uncontrolled with medicines.  Any of the symptoms which initially brought you in for treatment are getting worse rather than better.  You develop shortness of breath or chest pain. MAKE SURE YOU:   Understand these instructions.  Will watch your condition.  Will get help right away if you are not doing well or get worse. Document Released: 12/03/2005 Document Revised: 04/19/2014 Document Reviewed: 02/22/2011 [facility] Patient Information 2015 [place], [place]. This information is not intended to replace advice given to you by your health care provider. Make sure you discuss any questions you have with your health care provider.

## 2015-01-06 NOTE — Progress Notes (Signed)
Pt discharge instructions and prescriptions given, pt verbalized understanding.  Pt left floor ambulating transported home via [employer] cab.

## 2015-01-06 NOTE — Progress Notes (Signed)
Pt IV infiltrated RW, paged IV team Consult for restart.

## 2015-01-06 NOTE — Care Management Note (Signed)
CARE MANAGEMENT NOTE 01/06/2015  Patient:  MACIAH, SCHWEIGERT   Account Number:  [id number]  Date Initiated:  01/06/2015  Documentation initiated by:  Vitaly Wanat  Subjective/Objective Assessment:   CM following for progression and d/c planning.     Action/Plan:   01/06/2015 Met with pt re [facility] orders, and pt selected [facility] for [facility] services.  HHRN for medication management arranged.   Anticipated DC Date:  01/06/2015   Anticipated DC Plan:  [place]         [facility] Choice  HOME HEALTH   Choice offered to / List presented to:  C-1 Patient        [place] arranged  HH-1 RN      [place].   Status of service:  Completed, signed off Medicare Important Message given?  NO (If response is "NO", the following Medicare IM given date fields will be blank) Date Medicare IM given:   Medicare IM given by:   Date Additional Medicare IM given:   Additional Medicare IM given by:    Discharge Disposition:  [place]  Per UR Regulation:    If discussed at Long Length of Stay Meetings, dates discussed:    Comments:

## 2015-01-06 NOTE — Clinical Social Work Note (Signed)
Patient medically stable for discharge today and is requesting assistance with transportation. Patient will be transported home by cab - voucher provided.  Layah Skousen Givens, MSW, LCSW Licensed Clinical Social Worker [place] [phone]

## 2015-01-07 ENCOUNTER — Other Ambulatory Visit: Payer: Self-pay | Admitting: Family Medicine

## 2015-01-07 MED ORDER — METOPROLOL SUCCINATE ER 50 MG PO TB24: 50.0000 mg | ORAL_TABLET | Freq: Every day | ORAL | Status: DC

## 2015-01-10 LAB — CULTURE, BLOOD (ROUTINE X 2)
Culture: NO GROWTH
Culture: NO GROWTH

## 2015-01-11 ENCOUNTER — Telehealth: Payer: Self-pay | Admitting: Family Medicine

## 2015-01-11 ENCOUNTER — Inpatient Hospital Stay: Payer: Self-pay | Admitting: Family Medicine

## 2015-01-11 NOTE — Telephone Encounter (Signed)
Pt had appt today but his friend called asking for it to be rescheduled. She was unable to wake him up.  She stated he was breathing.  appt was scheduled for Thursday. Dr Venetia Maxon overheard the phone call and asked to send this in case he showed up at the hospital. It was recommend to her to call 911 if he continued to be unresponsive.  This was per Dr Venetia Maxon

## 2015-01-11 NOTE — Telephone Encounter (Signed)
Noted. Agree with going to hospital if unable to awaken. Leeanne Rio, MD

## 2015-01-13 ENCOUNTER — Inpatient Hospital Stay: Payer: Self-pay | Admitting: Family Medicine

## 2015-01-17 ENCOUNTER — Emergency Department (HOSPITAL_COMMUNITY)
Admission: EM | Admit: 2015-01-17 | Discharge: 2015-01-18 | Disposition: A | Discharge status: Home / Self Care | Payer: Medicaid Other | Attending: Emergency Medicine | Admitting: Emergency Medicine

## 2015-01-17 ENCOUNTER — Emergency Department (HOSPITAL_COMMUNITY): Payer: Medicaid Other

## 2015-01-17 ENCOUNTER — Encounter (HOSPITAL_COMMUNITY): Payer: Self-pay

## 2015-01-17 ENCOUNTER — Inpatient Hospital Stay: Payer: Self-pay | Admitting: Family Medicine

## 2015-01-17 DIAGNOSIS — Z22322 Carrier or suspected carrier of Methicillin resistant Staphylococcus aureus: Secondary | ICD-10-CM | POA: Insufficient documentation

## 2015-01-17 DIAGNOSIS — I10 Essential (primary) hypertension: Secondary | ICD-10-CM | POA: Insufficient documentation

## 2015-01-17 DIAGNOSIS — E785 Hyperlipidemia, unspecified: Secondary | ICD-10-CM | POA: Insufficient documentation

## 2015-01-17 DIAGNOSIS — G40909 Epilepsy, unspecified, not intractable, without status epilepticus: Secondary | ICD-10-CM | POA: Insufficient documentation

## 2015-01-17 DIAGNOSIS — Z7982 Long term (current) use of aspirin: Secondary | ICD-10-CM | POA: Diagnosis not present

## 2015-01-17 DIAGNOSIS — R05 Cough: Secondary | ICD-10-CM

## 2015-01-17 DIAGNOSIS — R4182 Altered mental status, unspecified: Secondary | ICD-10-CM | POA: Insufficient documentation

## 2015-01-17 DIAGNOSIS — Z8701 Personal history of pneumonia (recurrent): Secondary | ICD-10-CM | POA: Diagnosis not present

## 2015-01-17 DIAGNOSIS — Z79899 Other long term (current) drug therapy: Secondary | ICD-10-CM | POA: Diagnosis not present

## 2015-01-17 DIAGNOSIS — R059 Cough, unspecified: Secondary | ICD-10-CM

## 2015-01-17 DIAGNOSIS — Z8719 Personal history of other diseases of the digestive system: Secondary | ICD-10-CM | POA: Diagnosis not present

## 2015-01-17 LAB — URINALYSIS, ROUTINE W REFLEX MICROSCOPIC
Bilirubin Urine: NEGATIVE
Glucose, UA: NEGATIVE mg/dL
Hgb urine dipstick: NEGATIVE
Ketones, ur: NEGATIVE mg/dL
Leukocytes, UA: NEGATIVE
Nitrite: NEGATIVE
Protein, ur: NEGATIVE mg/dL
Specific Gravity, Urine: 1.013 (ref 1.005–1.030)
Urobilinogen, UA: 0.2 mg/dL (ref 0.0–1.0)
pH: 6 (ref 5.0–8.0)

## 2015-01-17 LAB — CBC
HCT: 39.5 % (ref 39.0–52.0)
Hemoglobin: 13.5 g/dL (ref 13.0–17.0)
MCH: 32.5 pg (ref 26.0–34.0)
MCHC: 34.2 g/dL (ref 30.0–36.0)
MCV: 95 fL (ref 78.0–100.0)
Platelets: 228 10*3/uL (ref 150–400)
RBC: 4.16 MIL/uL — ABNORMAL LOW (ref 4.22–5.81)
RDW: 14.2 % (ref 11.5–15.5)
WBC: 5.1 10*3/uL (ref 4.0–10.5)

## 2015-01-17 LAB — COMPREHENSIVE METABOLIC PANEL
ALT: 16 U/L (ref 0–53)
AST: 16 U/L (ref 0–37)
Albumin: 4 g/dL (ref 3.5–5.2)
Alkaline Phosphatase: 94 U/L (ref 39–117)
Anion gap: 6 (ref 5–15)
BUN: 22 mg/dL (ref 6–23)
CO2: 29 mmol/L (ref 19–32)
Calcium: 9.2 mg/dL (ref 8.4–10.5)
Chloride: 106 mmol/L (ref 96–112)
Creatinine, Ser: 1.07 mg/dL (ref 0.50–1.35)
GFR calc Af Amer: 85 mL/min — ABNORMAL LOW (ref >90)
GFR calc non Af Amer: 74 mL/min — ABNORMAL LOW (ref >90)
Glucose, Bld: 87 mg/dL (ref 70–99)
Potassium: 4.8 mmol/L (ref 3.5–5.1)
Sodium: 141 mmol/L (ref 135–145)
Total Bilirubin: 0.2 mg/dL — ABNORMAL LOW (ref 0.3–1.2)
Total Protein: 7.1 g/dL (ref 6.0–8.3)

## 2015-01-17 LAB — DIFFERENTIAL
Basophils Absolute: 0 10*3/uL (ref 0.0–0.1)
Basophils Relative: 1 % (ref 0–1)
Eosinophils Absolute: 0.3 10*3/uL (ref 0.0–0.7)
Eosinophils Relative: 6 % — ABNORMAL HIGH (ref 0–5)
Lymphocytes Relative: 32 % (ref 12–46)
Lymphs Abs: 1.6 10*3/uL (ref 0.7–4.0)
Monocytes Absolute: 0.4 10*3/uL (ref 0.1–1.0)
Monocytes Relative: 8 % (ref 3–12)
Neutro Abs: 2.7 10*3/uL (ref 1.7–7.7)
Neutrophils Relative %: 53 % (ref 43–77)

## 2015-01-17 LAB — CBG MONITORING, ED: Glucose-Capillary: 93 mg/dL (ref 70–99)

## 2015-01-17 NOTE — ED Provider Notes (Signed)
CSN: [id number]     Arrival date & time 01/17/15  2019 History   First MD Initiated Contact with Patient 01/17/15 2238     Chief Complaint  Patient presents with  . Altered Mental Status     (Consider location/radiation/quality/duration/timing/severity/associated sxs/prior Treatment) HPI [name] is a 59 y.o. male with history of seizures, recent admission and discharge on 01/06/14 for seizures, sepsis due to HCAP comes in for evaluation of altered mental status. History of present illness is given by patient and wife that is in the room at bedside. Wife states that patient had been having intermittent confusion and memory problems since his discharge on 1/21. Reports that 15:00 this afternoon he began to [facility] and have increased difficulty remembering where he placed certain objects. Patient also reports that he would stare off into space. She reports this confusion lasted until approximately 20:00 this evening as they were on their way to the ED. Both wife and the patient reported that he is more lucid now than he has ever been and that he is "better than baseline". They report they have not followed up with neurology yet, but will make sure they have an appointment this week. Reports he has been compliant with his Dilantin 200mg  qhs and Vimpat 100mg  BID. Patient denies headache, changes in vision, chest pain, shortness of breath, cough, nausea or vomiting, abdominal pain, constipation or diarrhea, numbness or weakness, syncope  Past Medical History  Diagnosis Date  . Seizures   . Hypertension   . GERD (gastroesophageal reflux disease)   . Dyslipidemia   . Schizoaffective disorder   . Depression   . Anxiety   . MRSA carrier    Past Surgical History  Procedure Laterality Date  . Self orchectomy    . Colonoscopy  2010  . Orif shoulder fracture    . Shoulder closed reduction Left 09/16/2013    Procedure: CLOSED REDUCTION SHOULDER;  Surgeon: Mauri Pole, MD;  Location: [facility];   Service: Orthopedics;  Laterality: Left;  . Shoulder hemi-arthroplasty Left 09/18/2013    Procedure: LEFT SHOULDER HEMI-ARTHROPLASTY;  Surgeon: Augustin Schooling, MD;  Location: [facility];  Service: Orthopedics;  Laterality: Left;  . Carpal tunnel release     Family History  Problem Relation Age of Onset  . Stroke Father     Living at 2  . Stroke Mother     Stroke in late 85's. Died in her 59's   History  Substance Use Topics  . Smoking status: Never Smoker   . Smokeless tobacco: Never Used  . Alcohol Use: No    Review of Systems  All other systems reviewed and are negative.   A 10 point review of systems was completed and was negative except for pertinent positives and negatives as mentioned in the history of present illness    Allergies  Codeine and Sulfa antibiotics  Home Medications   Prior to Admission medications   Medication Sig Start Date End Date Taking? Authorizing Provider  amitriptyline (ELAVIL) 150 MG tablet Take 2 tablets (300 mg total) by mouth at bedtime. 11/29/14  Yes Frazier Richards, MD  aspirin EC 81 MG tablet Take 81 mg by mouth daily.   Yes Historical Provider, MD  bisacodyl (DULCOLAX) 5 MG EC tablet Take 1 tablet (5 mg total) by mouth daily as needed for moderate constipation. 01/06/15  Yes Lavon Paganini, MD  clonazePAM (KLONOPIN) 2 MG tablet Take 2 mg by mouth 4 (four) times daily.   Yes  Historical Provider, MD  docusate sodium (COLACE) 100 MG capsule Take 1 capsule (100 mg total) by mouth 2 (two) times daily. 12/31/14  Yes Leeanne Rio, MD  lacosamide 100 MG TABS Take 1 tablet (100 mg total) by mouth 2 (two) times daily. 01/06/15  Yes Lavon Paganini, MD  metoprolol succinate (TOPROL-XL) 50 MG 24 hr tablet Take 1 tablet (50 mg total) by mouth at bedtime. Take with or immediately following a meal. 01/07/15  Yes Leeanne Rio, MD  phenytoin (DILANTIN) 50 MG tablet Chew 4 tablets (200 mg total) by mouth at bedtime. 11/29/14  Yes Frazier Richards, MD   polyethylene glycol (MIRALAX / GLYCOLAX) packet Take 17 g by mouth 2 (two) times daily as needed. For constipation. Mix into 8 ounces of fluid & drink 12/31/14  Yes Leeanne Rio, MD  QUEtiapine (SEROQUEL) 400 MG tablet Take 1 tablet (400 mg total) by mouth 2 (two) times daily. 01/06/15  Yes Lavon Paganini, MD  senna (SENOKOT) 8.6 MG TABS tablet Take 1 tablet (8.6 mg total) by mouth daily. 01/06/15  Yes Lavon Paganini, MD  simvastatin (ZOCOR) 40 MG tablet Take 1 tablet (40 mg total) by mouth at bedtime. 11/29/14  Yes Frazier Richards, MD  amoxicillin-clavulanate (AUGMENTIN) 875-125 MG per tablet Take 1 tablet by mouth every 12 (twelve) hours. Patient not taking: Reported on 01/17/2015 01/06/15   Lavon Paganini, MD  clonazePAM (KLONOPIN) 0.5 MG tablet Take 1 tablet (0.5 mg total) by mouth 2 (two) times daily. Patient not taking: Reported on 01/17/2015 01/06/15   Lavon Paganini, MD  ramelteon (ROZEREM) 8 MG tablet Take 1 tablet (8 mg total) by mouth at bedtime. Patient not taking: Reported on 01/02/2015 05/19/14   Melony Overly, MD  testosterone cypionate (DEPOTESTOTERONE CYPIONATE) 200 MG/ML injection Inject 2 mLs (400 mg total) into the muscle every 14 (fourteen) days. 05/17/14   Nena Polio, PA-C   BP 110/66 mmHg  Pulse 74  Temp(Src) 97.8 F (36.6 C) (Oral)  Resp 24  SpO2 93% Physical Exam  Constitutional: He is oriented to person, place, and time. He appears well-developed and well-nourished.  HENT:  Head: Normocephalic and atraumatic.  Mouth/Throat: Oropharynx is clear and moist.  Eyes: Conjunctivae are normal. Pupils are equal, round, and reactive to light. Right eye exhibits no discharge. Left eye exhibits no discharge. No scleral icterus.  Neck: Normal range of motion. Neck supple.  Cardiovascular: Normal rate, regular rhythm and normal heart sounds.   Pulmonary/Chest: Effort normal and breath sounds normal. No respiratory distress. He has no wheezes. He has no rales.  Abdominal:  Soft. There is no tenderness.  Musculoskeletal: He exhibits no tenderness.  Neurological: He is alert and oriented to person, place, and time.  Cranial Nerves II-XII grossly intact. Motor and sensation 5/5 in all 4 extremities, although left shoulder is slightly weaker than right, but this is baseline and known. Extraocular movements intact without a diagnosis. Completes finger to nose hand coordination movements without difficulty. Gait is baseline without ataxia. No tremor.  Skin: Skin is warm and dry. No rash noted.  Psychiatric: He has a normal mood and affect.  Nursing note and vitals reviewed.   ED Course  Procedures (including critical care time) Labs Review Labs Reviewed  CBC - Abnormal; Notable for the following:    RBC 4.16 (*)    All other components within normal limits  COMPREHENSIVE METABOLIC PANEL - Abnormal; Notable for the following:    Total Bilirubin 0.2 (*)  GFR calc non Af Amer 74 (*)    GFR calc Af Amer 85 (*)    All other components within normal limits  DIFFERENTIAL - Abnormal; Notable for the following:    Eosinophils Relative 6 (*)    All other components within normal limits  URINALYSIS, ROUTINE W REFLEX MICROSCOPIC  PHENYTOIN LEVEL, TOTAL  CBG MONITORING, ED    Imaging Review Dg Chest 2 View  01/17/2015   CLINICAL DATA:  Acute onset of confusion and weakness. Hypotension. Initial encounter.  EXAM: CHEST  2 VIEW  COMPARISON:  Chest radiograph performed 01/02/2015  FINDINGS: The lungs are well-aerated. Minimal left basilar opacity likely reflects atelectasis. There is no evidence of pleural effusion or pneumothorax.  The heart is normal in size; the mediastinal contour is within normal limits. No acute osseous abnormalities are seen. Hardware is noted at the right humeral head, and a left shoulder arthroplasty is also seen.  IMPRESSION: Minimal left basilar opacity likely reflects atelectasis.   Electronically Signed   By: Garald Balding M.D.   On: 01/17/2015  22:57     EKG Interpretation None     Meds given in ED:  Medications - No data to display  New Prescriptions   No medications on file   Filed Vitals:   01/17/15 2101 01/17/15 2355  BP: 101/58 110/66  Pulse: 80 74  Temp: 98.3 F (36.8 C) 97.8 F (36.6 C)  TempSrc: Oral Oral  Resp: 16 24  SpO2: 93% 93%    MDM  Vitals stable - WNL -afebrile Pt resting comfortably in ED. States he "feels better than he has felt a long time". PE--not concerning further acute or emergent pathology Labwork noncontributory, pending Dilantin level Imaging--chest x-ray shows minimal left basilar opacity that likely reflects atelectasis, otherwise noncontributory.  No evidence of seizure activity, postictal state. Low concern for CVA, ICH, SAH or other emergent intracranial pathology. Patient reports he will follow-up with his neurologist and primary care this week for further evaluation and management of his symptoms. I discussed all relevant lab findings and imaging results with pt and they verbalized understanding. Discussed f/u with PCP within 48 hrs and return precautions, pt very amenable to plan.  Care transferred to [facility], PA-C to follow-up on Dilantin level and subsequent discharge.  Prior to patient discharge, I discussed and reviewed this case with Dr.Otter    Final diagnoses:  Altered mental status, unspecified altered mental status type          Verl Dicker, PA-C 01/18/15 0123  Kalman Drape, MD 01/18/15 [phone]

## 2015-01-17 NOTE — ED Notes (Addendum)
Patient has been having confusion, memory problems, and several recent falls since he was last discharged on 01/06/15.  Friend reports that patient was talking incoherently and that today  has been the worst day.  Patient reports feeling "spaced out."

## 2015-01-18 ENCOUNTER — Telehealth: Payer: Self-pay | Admitting: Family Medicine

## 2015-01-18 ENCOUNTER — Ambulatory Visit (INDEPENDENT_AMBULATORY_CARE_PROVIDER_SITE_OTHER): Payer: Medicaid Other | Admitting: Family Medicine

## 2015-01-18 ENCOUNTER — Encounter: Payer: Self-pay | Admitting: Family Medicine

## 2015-01-18 VITALS — BP 104/68 | HR 92 | Temp 98.0°F | Ht 71.0 in | Wt 179.6 lb

## 2015-01-18 DIAGNOSIS — J189 Pneumonia, unspecified organism: Secondary | ICD-10-CM

## 2015-01-18 DIAGNOSIS — R410 Disorientation, unspecified: Secondary | ICD-10-CM

## 2015-01-18 DIAGNOSIS — G40909 Epilepsy, unspecified, not intractable, without status epilepticus: Secondary | ICD-10-CM

## 2015-01-18 LAB — PHENYTOIN LEVEL, TOTAL: Phenytoin Lvl: 14.3 ug/mL (ref 10.0–20.0)

## 2015-01-18 NOTE — Telephone Encounter (Signed)
New referral placed for [facility] CSW; pt already ordered for [facility] RN from previous hospitalization. Discussed with Leda Gauze, CSW -- appreciate assistance! --CMS

## 2015-01-18 NOTE — ED Notes (Signed)
Patient resting in bed at this time with spouse. No needs voiced.

## 2015-01-18 NOTE — Patient Instructions (Signed)
Thank you for coming in, today!  I think your pneumonia has cleared up. I'm not sure if your symptoms with memory are related to seizures or to the seizure medicines. I DO NOT think you had a stroke. Your labs and tests at the ED last night all looked okay, so I don't think we need to recheck anything.  Your appointment with neurology is next Monday, 2/8, at 10 in the morning. Ask them about possibly adjusting your medications, and if they would recommend anything for memory. Keep taking everything without any changes, for now.  I will let Dr. Ardelia Mems know how you're doing. She'll be on the lookout for the neurologist's note, as well. Come back to see her as you need -- maybe a few weeks after you see the neurologist. Come back immediately or go to the ED if you feel you need to be seen right away.  Please feel free to call with any questions or concerns at any time, at [phone]. --Dr. Venetia Maxon

## 2015-01-18 NOTE — Progress Notes (Signed)
   Subjective:    Patient ID: [name], male    DOB: 1956-09-12, 59 y.o.   MRN: [id number]  HPI: Pt presents to clinic for hospital and ED follow-up. He was admitted 1/17 to 1/21 for altered mental status and HCAP with sepsis; his history is significant for seizure disorder and schizoaffective disorder on multiple medications for both. He was treated with vancomycin / Zosyn, then switched to Augmentin for a total of 7 days, which he has completed. He reportedly had unwitnessed seizure activity on 1/18, and neurology and psychiatry were consulted to adjust his medications; he reports he has been compliant with all of them (Dilantin 200 mg qHS, Vimpat 100 mg BID, Seroquel 400 mg BID, Klonopin 0.5 mg BID, amitriptyline 300 mg qHS, and Rozarem 8 mg qHS; of note, neurology recommended holding Rozarem, and psychiatry recommended continuing it). He has had no further symptoms of pneumonia and denies cough, chest pain, fever / chills, N/V, or diarrhea. He did go to the ED last night, for concern for some periods of increased somnolence / confusion, and describes that he can be in the "middle of a thought or sentence" and lose track of what he is saying. He feels "fine," today, but does question whether this could be related to seizures or his medications; he has NOT had any frank seizure activity. He follows up with neurology next week.  Review of Systems: As above.     Objective:   Physical Exam BP 104/68 mmHg  Pulse 92  Temp(Src) 98 F (36.7 C) (Oral)  Ht 5\' 11"  (1.803 m)  Wt 179 lb 9 oz (81.449 kg)  BMI 25.05 kg/m2 Gen: well-appearing adult male, appears older than stated age 6: [place]/AT, EOMI, PERRLA, MMM Cardio: RRR, no murmur appreciated Pulm: CTAB, no wheezes Abd: soft, nontender, BS+ Ext: warm, well-perfused, no LE edema Neuro: CN II-XII grossly intact, alert, oriented, strength 5/5 throughout extremities, sensation grossly intact  Normal finger-to-nose testing  Normal gait / station /  balance / ambulation Psych: mood euthymic with congruent / appropriate affect     Assessment & Plan:  59yo male with recent HCAP / sepsis, now resolved, with hx of seizure disorder and some intermittent "memory" difficulties - exam grossly unremarkable today, and interview without frank deficits in cognition / attention span / immediate memory noted - labs / testing at the ED last night grossly unremarkable so defer any labs, today - continue current meds without changes, for now; defer to PCP and neurology consultants since pt is seeing neuro next week - suggested holding Rozarem to see if this could be causing some daytime sedation - f/u with PCP after neurology, in 2-3 weeks  Note FYI to PCP Dr. Lauree Chandler, MD PGY-3, [facility] 01/18/2015, 7:14 PM

## 2015-01-18 NOTE — ED Notes (Signed)
Patient resting in bed at this time with family member/close friend? In bed as well. Patient has asked multiple times about when he will be able to leave because he has to go to work in the morning. Patient has been informed by nurse and nurse tech as soon as we have lab results back we will have info to give.

## 2015-01-18 NOTE — Addendum Note (Signed)
Addended by: Emmaline Kluver on: 01/18/2015 08:05 PM   Modules accepted: Level of Service

## 2015-01-18 NOTE — ED Notes (Signed)
Patient and family verbalize understanding of discharge instructions, home care and follow up care. Patient ambulatory out of department at this time with spouse.

## 2015-01-18 NOTE — ED Provider Notes (Signed)
0320 - Patient care assumed from [facility], PA-C at shift change. Patient with phenytoin level pending at change of shift. Plan discussed with [place], PA-C which includes discharge if phenytoin level is therapeutic. Labs reviewed which reveal a phenytoin level of 14.3. As this level is therapeutic, will discharge the patient at this time. Discharge papers completed by prior provider. Patient discharged in good condition; VSS.  Filed Vitals:   01/17/15 2101 01/17/15 2355 01/18/15 0233  BP: 101/58 110/66 132/80  Pulse: 80 74 76  Temp: 98.3 F (36.8 C) 97.8 F (36.6 C)   TempSrc: Oral Oral   Resp: 16 24 16   SpO2: 93% 93% 96%     Antonietta Breach, PA-C 01/18/15 7124  Kalman Drape, MD 01/18/15 [phone]

## 2015-01-18 NOTE — ED Notes (Signed)
Patient ambulatory to restroom with assistance

## 2015-01-18 NOTE — Telephone Encounter (Signed)
Referral sent to [facility].  Hunt Oris, MSW, [place]

## 2015-01-18 NOTE — ED Notes (Signed)
Patient resting in bed at this time, family at bedside. No needs voiced

## 2015-01-18 NOTE — Discharge Instructions (Signed)
Altered Mental Status Altered mental status most often refers to an abnormal change in your responsiveness and awareness. It can affect your speech, thought, mobility, memory, attention span, or alertness. It can range from slight confusion to complete unresponsiveness (coma). Altered mental status can be a sign of a serious underlying medical condition. Rapid evaluation and medical treatment is necessary for patients having an altered mental status. CAUSES   Low blood sugar (hypoglycemia) or diabetes.  Severe loss of body fluids (dehydration) or a body salt (electrolyte) imbalance.  A stroke or other neurologic problem, such as dementia or delirium.  A head injury or tumor.  A drug or alcohol overdose.  Exposure to toxins or poisons.  Depression, anxiety, and stress.  A low oxygen level (hypoxia).  An infection.  Blood loss.  Twitching or shaking (seizure).  Heart problems, such as heart attack or heart rhythm problems (arrhythmias).  A body temperature that is too low or too high (hypothermia or hyperthermia). DIAGNOSIS  A diagnosis is based on your history, symptoms, physical and neurologic examinations, and diagnostic tests. Diagnostic tests may include:  Measurement of your blood pressure, pulse, breathing, and oxygen levels (vital signs).  Blood tests.  Urine tests.  X-ray exams.  A computerized magnetic scan (magnetic resonance imaging, MRI).  A computerized X-ray scan (computed tomography, CT scan). TREATMENT  Treatment will depend on the cause. Treatment may include:  Management of an underlying medical or mental health condition.  Critical care or support in the hospital. [place]   Only take over-the-counter or prescription medicines for pain, discomfort, or fever as directed by your caregiver.  Manage underlying conditions as directed by your caregiver.  Eat a healthy, well-balanced diet to maintain strength.  Join a support group or  prevention program to cope with the condition or trauma that caused the altered mental status. Ask your caregiver to help choose a program that works for you.  Follow up with your caregiver for further examination, therapy, or testing as directed. SEEK MEDICAL CARE IF:   You feel unwell or have chills.  You or your family notice a change in your behavior or your alertness.  You have trouble following your caregiver's treatment plan.  You have questions or concerns. SEEK IMMEDIATE MEDICAL CARE IF:   You have a rapid heartbeat or have chest pain.  You have difficulty breathing.  You have a fever.  You have a headache with a stiff neck.  You cough up blood.  You have blood in your urine or stool.  You have severe agitation or confusion. MAKE SURE YOU:   Understand these instructions.  Will watch your condition.  Will get help right away if you are not doing well or get worse. Document Released: 05/23/2010 Document Revised: 02/25/2012 Document Reviewed: 05/23/2010 [facility] Patient Information 2015 [place], [place]. This information is not intended to replace advice given to you by your health care provider. Make sure you discuss any questions you have with your health care provider.   You were evaluated today in the ED for your recent altered mental status. There is not appear to be an emergent cause for your symptoms that she experienced earlier. He reported feeling much better now, better than you have in a long while. It is important to follow-up with your primary care and your neurologist for further evaluation and management of your symptoms. Return to ED for new worsening symptoms

## 2015-01-18 NOTE — Telephone Encounter (Signed)
[facility]: would like for pt to talk to a social work about his home situation Needs a referral for this

## 2015-01-19 NOTE — Progress Notes (Signed)
I have reviewed the resident note and agree with documentation and plan.  Dossie Arbour MD

## 2015-01-24 ENCOUNTER — Ambulatory Visit (INDEPENDENT_AMBULATORY_CARE_PROVIDER_SITE_OTHER): Payer: Medicaid Other | Admitting: Adult Health

## 2015-01-24 ENCOUNTER — Encounter: Payer: Self-pay | Admitting: Adult Health

## 2015-01-24 VITALS — BP 132/84 | HR 104 | Ht 70.0 in | Wt 181.0 lb

## 2015-01-24 DIAGNOSIS — F329 Major depressive disorder, single episode, unspecified: Secondary | ICD-10-CM

## 2015-01-24 DIAGNOSIS — R569 Unspecified convulsions: Secondary | ICD-10-CM

## 2015-01-24 DIAGNOSIS — F32A Depression, unspecified: Secondary | ICD-10-CM

## 2015-01-24 DIAGNOSIS — R413 Other amnesia: Secondary | ICD-10-CM

## 2015-01-24 DIAGNOSIS — Z5181 Encounter for therapeutic drug level monitoring: Secondary | ICD-10-CM

## 2015-01-24 MED ORDER — LACOSAMIDE 100 MG PO TABS: 100.0000 mg | ORAL_TABLET | Freq: Two times a day (BID) | ORAL | Status: DC

## 2015-01-24 MED ORDER — PHENYTOIN SODIUM EXTENDED 100 MG PO CAPS: 200.0000 mg | ORAL_CAPSULE | Freq: Every day | ORAL | Status: DC

## 2015-01-24 NOTE — Progress Notes (Signed)
PATIENT: [name] DOB: 1956-02-21  REASON FOR VISIT: follow up-seizures, memory loss, depression HISTORY FROM: patient  HISTORY OF PRESENT ILLNESS: [name] is a 59 year old male with a history of seizures. He returns today for follow-up. The patient's mediation was recently increased to Vimpat 100 mg BID.  He is also on Dilantin. There was some concern about medication compliance so home health nursing was ordered for the patient. Since the last visit, the patient reports that he had not had any issues since the last visit. Although according to Epic the patient was taking to the ED at the beginning of the month for altered mental status and garbled speech. According to the notes once he was at the hospital he had returned to his baseline. Blood work was unremarkable. No evidence of seizure activity was noted but couldn't rule out post ictal phase. Patient doesn't recall why he went to the hospital. He states that when he was having seizures frequently it really "messed up his memory." He is unsure if anyone from home health came to help with his medications but now he states that he is very comfortable with his medication management. He does state that someone is coming out this week from home health nursing to check on his status. He does not operate a motor vehicle. Denies any trouble with ambulation or balance. He is able to complete all ADLs independently. Patient does suffer from ongoing depression. He has regular visits with his psychiatrist and therapist. He feels that his medication is no longer working as well as it did. No new medical issues since last seen.   HISTORY 12/28/14 ([name]): [name] is a 59 year old left-handed white male with a history of epilepsy. The patient been followed through this office in the past, he has not been seen since 2009. Indicates that the [facility] family practice group has been following him for his seizures, and he has been treated with Dilantin  and Keppra. He seemed to be relatively well controlled until just recently. The patient again having seizures in the summer 2015. The patient was recently admitted to the hospital in December 2015 with recurring seizures. He was taken off of the Keppra, and switched to Vimpat along with the Dilantin. The patient lives alone. He has a friend that may drive him to get his groceries, etc. The patient indicates that he may miss a dose of medication every once in a while. The patient comes in the office today for reevaluation for the seizure events. A CT scan of brain was recently done in the hospital, this was unremarkable. There have been no recent EEG evaluations. The patient reports he may get a headache at the onset of the seizure. He is having 2 or 3 seizure events a week. The patient has some occasional nausea with the seizures. He reports no focal numbness or weakness of the face, arms, or legs. He experienced a seizure today in our office  REVIEW OF SYSTEMS: Out of a complete 14 system review of symptoms, the patient complains only of the following symptoms, and all other reviewed systems are negative.  Heat intolerance, seizure, depression, nervous/anxious.   ALLERGIES: Allergies  Allergen Reactions  . Codeine Nausea And Vomiting  . Sulfa Antibiotics Nausea And Vomiting    HOME MEDICATIONS: Outpatient Prescriptions Prior to Visit  Medication Sig Dispense Refill  . amitriptyline (ELAVIL) 150 MG tablet Take 2 tablets (300 mg total) by mouth at bedtime. 60 tablet 3  .  amoxicillin-clavulanate (AUGMENTIN) 875-125 MG per tablet Take 1 tablet by mouth every 12 (twelve) hours. 7 tablet 0  . aspirin EC 81 MG tablet Take 81 mg by mouth daily.    . bisacodyl (DULCOLAX) 5 MG EC tablet Take 1 tablet (5 mg total) by mouth daily as needed for moderate constipation. 30 tablet 0  . clonazePAM (KLONOPIN) 0.5 MG tablet Take 1 tablet (0.5 mg total) by mouth 2 (two) times daily. 60 tablet 0  . clonazePAM  (KLONOPIN) 2 MG tablet Take 2 mg by mouth 4 (four) times daily.    Marland Kitchen docusate sodium (COLACE) 100 MG capsule Take 1 capsule (100 mg total) by mouth 2 (two) times daily. 60 capsule 0  . lacosamide 100 MG TABS Take 1 tablet (100 mg total) by mouth 2 (two) times daily. 60 tablet 0  . metoprolol succinate (TOPROL-XL) 50 MG 24 hr tablet Take 1 tablet (50 mg total) by mouth at bedtime. Take with or immediately following a meal. 30 tablet 0  . phenytoin (DILANTIN) 50 MG tablet Chew 4 tablets (200 mg total) by mouth at bedtime. 120 tablet 6  . polyethylene glycol (MIRALAX / GLYCOLAX) packet Take 17 g by mouth 2 (two) times daily as needed. For constipation. Mix into 8 ounces of fluid & drink 100 each 6  . QUEtiapine (SEROQUEL) 400 MG tablet Take 1 tablet (400 mg total) by mouth 2 (two) times daily. 60 tablet 0  . ramelteon (ROZEREM) 8 MG tablet Take 1 tablet (8 mg total) by mouth at bedtime. 30 tablet 2  . senna (SENOKOT) 8.6 MG TABS tablet Take 1 tablet (8.6 mg total) by mouth daily. 120 each 0  . simvastatin (ZOCOR) 40 MG tablet Take 1 tablet (40 mg total) by mouth at bedtime. 30 tablet 6  . testosterone cypionate (DEPOTESTOTERONE CYPIONATE) 200 MG/ML injection Inject 2 mLs (400 mg total) into the muscle every 14 (fourteen) days. 10 mL 0   No facility-administered medications prior to visit.    PAST MEDICAL HISTORY: Past Medical History  Diagnosis Date  . Seizures   . Hypertension   . GERD (gastroesophageal reflux disease)   . Dyslipidemia   . Schizoaffective disorder   . Depression   . Anxiety   . MRSA carrier     PAST SURGICAL HISTORY: Past Surgical History  Procedure Laterality Date  . Self orchectomy    . Colonoscopy  2010  . Orif shoulder fracture    . Shoulder closed reduction Left 09/16/2013    Procedure: CLOSED REDUCTION SHOULDER;  Surgeon: Mauri Pole, MD;  Location: [facility];  Service: Orthopedics;  Laterality: Left;  . Shoulder hemi-arthroplasty Left 09/18/2013    Procedure:  LEFT SHOULDER HEMI-ARTHROPLASTY;  Surgeon: Augustin Schooling, MD;  Location: [facility];  Service: Orthopedics;  Laterality: Left;  . Carpal tunnel release      FAMILY HISTORY: Family History  Problem Relation Age of Onset  . Stroke Father     Living at 95  . Stroke Mother     Stroke in late 40's. Died in her 28's   PHYSICAL EXAM  Filed Vitals:   01/24/15 0944  BP: 132/84  Pulse: 104  Height: 5\' 10"  (1.778 m)  Weight: 181 lb (82.101 kg)   Body mass index is 25.97 kg/(m^2).  Generalized: Well developed, in no acute distress   Neurological examination  Mentation: Alert oriented to time, place, history taking. Follows all commands speech and language fluent. MMSE 26/30- missed recall of words Cranial nerve  II-XII: Pupils were equal round reactive to light. Extraocular movements were full, visual field were full on confrontational test. Facial sensation and strength were normal. Uvula tongue midline. Head turning and shoulder shrug  were normal and symmetric. Motor: The motor testing reveals 5 over 5 strength of all 4 extremities. Good symmetric motor tone is noted throughout.  Sensory: Sensory testing is intact to soft touch on all 4 extremities. No evidence of extinction is noted.  Coordination: Cerebellar testing reveals good finger-nose-finger and heel-to-shin bilaterally.  Gait and station: Gait is normal. Tandem gait is slightly unsteady. Romberg is negative. No drift is seen.  Reflexes: Deep tendon reflexes are symmetric and normal bilaterally.    DIAGNOSTIC DATA (LABS, IMAGING, TESTING) - I reviewed patient records, labs, notes, testing and imaging myself where available.  Lab Results  Component Value Date   WBC 5.1 01/17/2015   HGB 13.5 01/17/2015   HCT 39.5 01/17/2015   MCV 95.0 01/17/2015   PLT 228 01/17/2015      Component Value Date/Time   NA 141 01/17/2015 2115   K 4.8 01/17/2015 2115   CL 106 01/17/2015 2115   CO2 29 01/17/2015 2115   GLUCOSE 87 01/17/2015 2115    BUN 22 01/17/2015 2115   CREATININE 1.07 01/17/2015 2115   CREATININE 0.94 10/13/2014 1503   CALCIUM 9.2 01/17/2015 2115   PROT 7.1 01/17/2015 2115   ALBUMIN 4.0 01/17/2015 2115   AST 16 01/17/2015 2115   ALT 16 01/17/2015 2115   ALKPHOS 94 01/17/2015 2115   BILITOT 0.2* 01/17/2015 2115   GFRNONAA 74* 01/17/2015 2115   GFRAA 85* 01/17/2015 2115   Lab Results  Component Value Date   CHOL 149 12/21/2013   HDL 53 12/21/2013   LDLCALC 64 12/21/2013   TRIG 195* 10/20/2014   CHOLHDL 2.8 12/21/2013   Lab Results  Component Value Date   HGBA1C 5.3 05/19/2014   No results found for: [id number] Lab Results  Component Value Date   TSH 1.559 01/03/2015      ASSESSMENT AND PLAN 59 y.o. year old male  has a past medical history of Seizures; Hypertension; GERD (gastroesophageal reflux disease); Dyslipidemia; Schizoaffective disorder; Depression; Anxiety; and MRSA carrier. here with:  1. Seizures 2. Memory loss 3. Depression  Patient reports that he has not had any seizures since the last visit. However he was just taken to the ED at the beginning of the month for altered mental status. His mentation had returned to baseline once at the ED. Blood work was unremarkable. Dilantin level in normal range. Patient continues to have some memory issues. He didn't recall the reason he went to the ED. At first he stated that he was "unsure" if home health had come to help with his medications but later states that they are coming out this week. I am concerned that the patient's memory could be hindering his medication management. I will wait to see what home health states this week when they see him. I will refill Vimpat and Dilantin today. Dilantin changed to 100 mg tablets due to the patient not liking the chewable 50 mg tablets. I will check a Vimpat level to ensure that he is taking the medication. I have encouraged the patent to follow-up with his physiatrist and therapist regarding his  depression. If he has any seizure events or what he thinks may have been a seizure he should let us know so his medications can be adjusted.      Ward Givens, MSN,  NP-C 01/24/2015, 10:16 AM [facility] [address] [place], [place] [address] [phone]  Note: This document was prepared with digital dictation and possible smart phrase technology. Any transcriptional errors that result from this process are unintentional.

## 2015-01-24 NOTE — Patient Instructions (Addendum)
Continue Vimpat 100 mg twice a day- refill given Continue Dilantin 100 mg 2 tablet at bedtime- refill sent.  Please follow-up with her pychiatrist and therapist about ongoing depression If you have any seizure events- please let us know.

## 2015-01-24 NOTE — Progress Notes (Signed)
I have read the note, and I agree with the clinical assessment and plan.  Phylisha Dix KEITH   

## 2015-01-26 LAB — LACOSAMIDE: Lacosamide: 4.7 ug/mL — ABNORMAL LOW (ref 5.0–10.0)

## 2015-01-27 ENCOUNTER — Telehealth: Payer: Self-pay | Admitting: *Deleted

## 2015-01-27 ENCOUNTER — Encounter: Payer: Self-pay | Admitting: *Deleted

## 2015-01-27 ENCOUNTER — Other Ambulatory Visit: Payer: Self-pay | Admitting: Family Medicine

## 2015-01-27 NOTE — Telephone Encounter (Signed)
called several times to give rsults today over 3 times no answer

## 2015-02-01 ENCOUNTER — Telehealth: Payer: Self-pay | Admitting: Family Medicine

## 2015-02-01 ENCOUNTER — Other Ambulatory Visit: Payer: Self-pay | Admitting: *Deleted

## 2015-02-01 NOTE — Telephone Encounter (Signed)
[place] [facility], please call pt and ask him what his current dose of testosterone is. We have him down as taking 400mg  every 2 weeks, but I need to know for sure that this is the dose he is taking before I can refill it.  He also needs to schedule an appointment with me to discuss his testosterone levels and dosing of this medication. Please inform him.  Thanks! Leeanne Rio, MD

## 2015-02-01 NOTE — Telephone Encounter (Signed)
Please see phone note regarding this request. I am waiting to hear from patient prior to filling it. Leeanne Rio, MD

## 2015-02-02 NOTE — Telephone Encounter (Signed)
Please see phone note regarding this request. I am waiting to hear from patient prior to filling it. Leeanne Rio, MD

## 2015-02-03 NOTE — Telephone Encounter (Signed)
Left message on voicemail for patient to call back. 

## 2015-02-07 NOTE — Telephone Encounter (Signed)
Left another message on voicemail for patient to call back. 

## 2015-02-08 ENCOUNTER — Other Ambulatory Visit: Payer: Self-pay | Admitting: Family Medicine

## 2015-02-08 NOTE — Telephone Encounter (Signed)
Left another message on voicemail, will await call back from patient.

## 2015-02-09 ENCOUNTER — Other Ambulatory Visit: Payer: Self-pay | Admitting: *Deleted

## 2015-02-09 NOTE — Telephone Encounter (Signed)
2nd request.  Adalberto Metzgar L, RN  

## 2015-02-10 ENCOUNTER — Telehealth: Payer: Self-pay | Admitting: Family Medicine

## 2015-02-15 ENCOUNTER — Emergency Department (HOSPITAL_COMMUNITY): Payer: Medicaid Other

## 2015-02-15 ENCOUNTER — Encounter (HOSPITAL_COMMUNITY): Payer: Self-pay | Admitting: Emergency Medicine

## 2015-02-15 ENCOUNTER — Inpatient Hospital Stay (HOSPITAL_COMMUNITY)
Admission: EM | Admit: 2015-02-15 | Discharge: 2015-02-17 | DRG: 101 | Disposition: A | Discharge status: Home / Self Care | Payer: Medicaid Other | Attending: Family Medicine | Admitting: Family Medicine

## 2015-02-15 DIAGNOSIS — K219 Gastro-esophageal reflux disease without esophagitis: Secondary | ICD-10-CM | POA: Diagnosis present

## 2015-02-15 DIAGNOSIS — R4182 Altered mental status, unspecified: Secondary | ICD-10-CM | POA: Diagnosis present

## 2015-02-15 DIAGNOSIS — E785 Hyperlipidemia, unspecified: Secondary | ICD-10-CM | POA: Diagnosis present

## 2015-02-15 DIAGNOSIS — Z96612 Presence of left artificial shoulder joint: Secondary | ICD-10-CM | POA: Diagnosis present

## 2015-02-15 DIAGNOSIS — I10 Essential (primary) hypertension: Secondary | ICD-10-CM | POA: Diagnosis present

## 2015-02-15 DIAGNOSIS — Z7982 Long term (current) use of aspirin: Secondary | ICD-10-CM

## 2015-02-15 DIAGNOSIS — Z22322 Carrier or suspected carrier of Methicillin resistant Staphylococcus aureus: Secondary | ICD-10-CM

## 2015-02-15 DIAGNOSIS — F322 Major depressive disorder, single episode, severe without psychotic features: Secondary | ICD-10-CM | POA: Diagnosis present

## 2015-02-15 DIAGNOSIS — F251 Schizoaffective disorder, depressive type: Secondary | ICD-10-CM | POA: Diagnosis present

## 2015-02-15 DIAGNOSIS — F418 Other specified anxiety disorders: Secondary | ICD-10-CM | POA: Diagnosis present

## 2015-02-15 DIAGNOSIS — G40909 Epilepsy, unspecified, not intractable, without status epilepticus: Principal | ICD-10-CM

## 2015-02-15 LAB — COMPREHENSIVE METABOLIC PANEL
ALT: 22 U/L (ref 0–53)
AST: 34 U/L (ref 0–37)
Albumin: 4.5 g/dL (ref 3.5–5.2)
Alkaline Phosphatase: 112 U/L (ref 39–117)
Anion gap: 7 (ref 5–15)
BUN: 19 mg/dL (ref 6–23)
CO2: 25 mmol/L (ref 19–32)
Calcium: 8.8 mg/dL (ref 8.4–10.5)
Chloride: 107 mmol/L (ref 96–112)
Creatinine, Ser: 1.05 mg/dL (ref 0.50–1.35)
GFR calc Af Amer: 87 mL/min — ABNORMAL LOW (ref >90)
GFR calc non Af Amer: 75 mL/min — ABNORMAL LOW (ref >90)
Glucose, Bld: 92 mg/dL (ref 70–99)
Potassium: 4.8 mmol/L (ref 3.5–5.1)
Sodium: 139 mmol/L (ref 135–145)
Total Bilirubin: 1 mg/dL (ref 0.3–1.2)
Total Protein: 7.1 g/dL (ref 6.0–8.3)

## 2015-02-15 LAB — URINALYSIS, ROUTINE W REFLEX MICROSCOPIC
Bilirubin Urine: NEGATIVE
Glucose, UA: NEGATIVE mg/dL
Hgb urine dipstick: NEGATIVE
Ketones, ur: NEGATIVE mg/dL
Leukocytes, UA: NEGATIVE
Nitrite: NEGATIVE
Protein, ur: NEGATIVE mg/dL
Specific Gravity, Urine: 1.016 (ref 1.005–1.030)
Urobilinogen, UA: 0.2 mg/dL (ref 0.0–1.0)
pH: 6 (ref 5.0–8.0)

## 2015-02-15 LAB — CBC WITH DIFFERENTIAL/PLATELET
Basophils Absolute: 0 10*3/uL (ref 0.0–0.1)
Basophils Relative: 1 % (ref 0–1)
Eosinophils Absolute: 0.1 10*3/uL (ref 0.0–0.7)
Eosinophils Relative: 2 % (ref 0–5)
HCT: 40.8 % (ref 39.0–52.0)
Hemoglobin: 14.2 g/dL (ref 13.0–17.0)
Lymphocytes Relative: 23 % (ref 12–46)
Lymphs Abs: 1.2 10*3/uL (ref 0.7–4.0)
MCH: 32.4 pg (ref 26.0–34.0)
MCHC: 34.8 g/dL (ref 30.0–36.0)
MCV: 93.2 fL (ref 78.0–100.0)
Monocytes Absolute: 0.3 10*3/uL (ref 0.1–1.0)
Monocytes Relative: 6 % (ref 3–12)
Neutro Abs: 3.5 10*3/uL (ref 1.7–7.7)
Neutrophils Relative %: 68 % (ref 43–77)
Platelets: 212 10*3/uL (ref 150–400)
RBC: 4.38 MIL/uL (ref 4.22–5.81)
RDW: 14.2 % (ref 11.5–15.5)
WBC: 5.1 10*3/uL (ref 4.0–10.5)

## 2015-02-15 LAB — RAPID URINE DRUG SCREEN, HOSP PERFORMED
Amphetamines: NOT DETECTED
Barbiturates: NOT DETECTED
Benzodiazepines: POSITIVE — AB
Cocaine: NOT DETECTED
Opiates: NOT DETECTED
Tetrahydrocannabinol: NOT DETECTED

## 2015-02-15 LAB — ETHANOL: Alcohol, Ethyl (B): <5 mg/dL (ref 0–9)

## 2015-02-15 LAB — CBG MONITORING, ED: Glucose-Capillary: 88 mg/dL (ref 70–99)

## 2015-02-15 LAB — PHENYTOIN LEVEL, TOTAL: Phenytoin Lvl: 14 ug/mL (ref 10.0–20.0)

## 2015-02-15 MED ORDER — LORAZEPAM 2 MG/ML IJ SOLN
1.0000 mg | Freq: Once | INTRAMUSCULAR | Status: AC
  Administered 2015-02-15: 1 mg via INTRAVENOUS
  Filled 2015-02-15: qty 1

## 2015-02-15 NOTE — Progress Notes (Signed)
This pt is [facility] with [facility] per [facility] for home health Rn and has been evaluated by [facility]

## 2015-02-15 NOTE — Consult Note (Addendum)
Consult Reason for Consult: altered mental status Referring Physician: Dr Colin Rhein  CC: altered mental status  HPI: [name] is an 59 y.o. male with history of seizure, schizoaffective disorder presenting after being found confused at home. Per ED notes patient found by cable guy confused and weak, unable to provide history. Remained confused in ED, agitated, trying to get out of room. Appears to be having visual hallucinations (describes people coming into his room). Patient currently resting comfortably, states he wants to go home.   CT head in ED imaging reviewed and overall unremarkable. UDS positive for benzo. Dilantin level 14.0. Home meds include klonopin 2mg  QID, dilantin 200mg  qhs, seroquel 400mg  BID, Elavil 300mg  qhs, vimpat 100mg  BID, rozerem 8mg  qhs. Patient is followed at [facility]. Patient in ED 1 month ago for similar presentation, mental status slowly improved to baseline at that time.   Past Medical History  Diagnosis Date  . Seizures   . Hypertension   . GERD (gastroesophageal reflux disease)   . Dyslipidemia   . Schizoaffective disorder   . Depression   . Anxiety   . MRSA carrier     Past Surgical History  Procedure Laterality Date  . Self orchectomy    . Colonoscopy  2010  . Orif shoulder fracture    . Shoulder closed reduction Left 09/16/2013    Procedure: CLOSED REDUCTION SHOULDER;  Surgeon: Mauri Pole, MD;  Location: [facility];  Service: Orthopedics;  Laterality: Left;  . Shoulder hemi-arthroplasty Left 09/18/2013    Procedure: LEFT SHOULDER HEMI-ARTHROPLASTY;  Surgeon: Augustin Schooling, MD;  Location: [place];  Service: Orthopedics;  Laterality: Left;  . Carpal tunnel release      Family History  Problem Relation Age of Onset  . Stroke Father     Living at 61  . Stroke Mother     Stroke in late 39's. Died in her 3's    Social History:  reports that he has never smoked. He has never used smokeless tobacco. He reports that he does not drink alcohol or use  illicit drugs.  Allergies  Allergen Reactions  . Codeine Nausea And Vomiting  . Sulfa Antibiotics Nausea And Vomiting    Medications: I have reviewed the patient's current medications.  ROS: Out of a complete 14 system review, the patient complains of only the following symptoms, and all other reviewed systems are negative. Denies any ROS  Physical Examination: Filed Vitals:   02/15/15 1932  BP: 110/67  Pulse: 79  Temp:   Resp: 20   Physical Exam  Constitutional: He appears well-developed and well-nourished.  Psych: Affect appropriate to situation Eyes: No scleral injection HENT: No OP obstrucion Head: Normocephalic.  Cardiovascular: Normal rate and regular rhythm.  Respiratory: Effort normal and breath sounds normal.  GI: Soft. Bowel sounds are normal. No distension. There is no tenderness.  Skin: WDI  Neurologic Examination Mental Status: Alert, oriented to name, date, location. Tangential thought process requiring redirection.  Speech fluent without evidence of aphasia.  No dysarthria. Able to follow 1 step commands but difficulty with multi-step commands Cranial Nerves: II: funduscopic exam wnl bilaterally, visual fields grossly normal, pupils equal, round, reactive to light and accommodation III,IV, VI: ptosis not present, extra-ocular motions intact bilaterally V,VII: smile symmetric, facial light touch sensation normal bilaterally VIII: hearing normal bilaterally IX,X: gag reflex present XI: trapezius strength/neck flexion strength normal bilaterally XII: tongue strength normal  Motor: Right : Upper extremity    Left:     Upper  extremity 5/5 deltoid       5/5 deltoid 5/5 biceps      5/5 biceps  5/5 triceps      5/5 triceps 5/5 hand grip      5/5 hand grip  Lower extremity     Lower extremity 5/5 hip flexor      5/5 hip flexor 5/5 quadricep      5/5 quadriceps  5/5 hamstrings     5/5 hamstrings 5/5 plantar flexion       5/5 plantar flexion 5/5 plantar  extension     5/5 plantar extension Tone and bulk:normal tone throughout; no atrophy noted Sensory: Pinprick and light touch intact throughout, bilaterally Deep Tendon Reflexes: 2+ and symmetric throughout Plantars: Right: downgoing   Left: downgoing Cerebellar: normal finger-to-nose, normal rapid alternating movements and normal heel-to-shin test Gait: normal gait and station  Laboratory Studies:   Basic Metabolic Panel:  Recent Labs Lab 02/15/15 1537  NA 139  K 4.8  CL 107  CO2 25  GLUCOSE 92  BUN 19  CREATININE 1.05  CALCIUM 8.8    Liver Function Tests:  Recent Labs Lab 02/15/15 1537  AST 34  ALT 22  ALKPHOS 112  BILITOT 1.0  PROT 7.1  ALBUMIN 4.5   No results for input(s): LIPASE, AMYLASE in the last 168 hours. No results for input(s): AMMONIA in the last 168 hours.  CBC:  Recent Labs Lab 02/15/15 1537  WBC 5.1  NEUTROABS 3.5  HGB 14.2  HCT 40.8  MCV 93.2  PLT 212    Cardiac Enzymes: No results for input(s): CKTOTAL, CKMB, CKMBINDEX, TROPONINI in the last 168 hours.  BNP: Invalid input(s): POCBNP  CBG:  Recent Labs Lab 02/15/15 1454  GLUCAP 88    Microbiology: Results for orders placed or performed during the hospital encounter of 01/02/15  Culture, blood (routine x 2)     Status: None   Collection Time: 01/02/15  6:56 PM  Result Value Ref Range Status   Specimen Description BLOOD RIGHT ARM  Final   Special Requests BOTTLES DRAWN AEROBIC AND ANAEROBIC 5CC  Final   Culture   Final    NO GROWTH 5 DAYS Performed at [facility]    Report Status 01/10/2015 FINAL  Final  Culture, blood (routine x 2)     Status: None   Collection Time: 01/02/15  7:14 PM  Result Value Ref Range Status   Specimen Description BLOOD R HAND  Final   Special Requests BOTTLES DRAWN AEROBIC AND ANAEROBIC 3ML  Final   Culture   Final    NO GROWTH 5 DAYS Performed at [facility]    Report Status 01/10/2015 FINAL  Final  Urine culture      Status: None   Collection Time: 01/02/15  7:33 PM  Result Value Ref Range Status   Specimen Description URINE, CATHETERIZED  Final   Special Requests NONE  Final   Colony Count NO GROWTH Performed at [facility]   Final   Culture NO GROWTH Performed at [facility]   Final   Report Status 01/03/2015 FINAL  Final  MRSA PCR Screening     Status: None   Collection Time: 01/03/15  2:03 AM  Result Value Ref Range Status   MRSA by PCR NEGATIVE NEGATIVE Final    Comment:        The GeneXpert MRSA Assay (FDA approved for NASAL specimens only), is one component of a comprehensive MRSA colonization surveillance program.  It is not intended to diagnose MRSA infection nor to guide or monitor treatment for MRSA infections.     Coagulation Studies: No results for input(s): LABPROT, INR in the last 72 hours.  Urinalysis:  Recent Labs Lab 02/15/15 1513  COLORURINE YELLOW  LABSPEC 1.016  PHURINE 6.0  GLUCOSEU NEGATIVE  HGBUR NEGATIVE  BILIRUBINUR NEGATIVE  KETONESUR NEGATIVE  PROTEINUR NEGATIVE  UROBILINOGEN 0.2  NITRITE NEGATIVE  LEUKOCYTESUR NEGATIVE    Lipid Panel:     Component Value Date/Time   CHOL 149 12/21/2013 1706   TRIG 195* 10/20/2014 2104   HDL 53 12/21/2013 1706   CHOLHDL 2.8 12/21/2013 1706   VLDL 32 12/21/2013 1706   LDLCALC 64 12/21/2013 1706    HgbA1C:  Lab Results  Component Value Date   HGBA1C 5.3 05/19/2014    Urine Drug Screen:     Component Value Date/Time   LABOPIA NONE DETECTED 02/15/2015 1520   COCAINSCRNUR NONE DETECTED 02/15/2015 1520   LABBENZ POSITIVE* 02/15/2015 1520   AMPHETMU NONE DETECTED 02/15/2015 1520   THCU NONE DETECTED 02/15/2015 1520   LABBARB NONE DETECTED 02/15/2015 1520    Alcohol Level:  Recent Labs Lab 02/15/15 2050  ETH <5    Other results:  Imaging: Dg Chest 2 View  02/15/2015   CLINICAL DATA:  Altered mental status. History seizures. Schizoaffective disorder. Hypertension. Anxiety and  depression.  EXAM: CHEST  2 VIEW  COMPARISON:  01/17/2015  FINDINGS: right proximal humerus fixation. Left glenohumeral joint arthroplasty. Midline trachea. Normal heart size. No pleural effusion or pneumothorax. Left base volume loss with patchy scarring, similar. No new pulmonary opacity.  IMPRESSION: No acute cardiopulmonary disease.  Left base scarring.   Electronically Signed   By: Abigail Miyamoto M.D.   On: 02/15/2015 16:34   Ct Head Wo Contrast  02/15/2015   CLINICAL DATA:  Altered mental status, slurred speech  EXAM: CT HEAD WITHOUT CONTRAST  TECHNIQUE: Contiguous axial images were obtained from the base of the skull through the vertex without intravenous contrast.  COMPARISON:  01/03/2015  FINDINGS: There is no evidence of mass effect, midline shift, or extra-axial fluid collections. There is no evidence of a space-occupying lesion or intracranial hemorrhage. There is no evidence of a cortical-based area of acute infarction. There is mild generalized cerebral atrophy.  The ventricles and sulci are appropriate for the patient's age. The basal cisterns are patent.  Visualized portions of the orbits are unremarkable. The visualized portions of the paranasal sinuses and mastoid air cells are unremarkable.  The osseous structures are unremarkable.  IMPRESSION: No acute intracranial pathology.   Electronically Signed   By: Kathreen Devoid   On: 02/15/2015 16:36     Assessment/Plan:  Mr Stanke is a 59y/o gentleman with history of seizures, schizoaffective disorder presenting with altered mental status. Physical exam is non-focal. CT head and lab workup unremarkable. Unclear etiology of symptoms. Question possible prolonged post-ictal state.   -check EEG -check vimpat level -check ammonia, TSH, B12 -consider psychiatry evaluation if above unremarkable and mental status does not improve -continue dilantin and vimpat at home dose  Jim Like, DO [name] [phone]  If 7pm- 7am,  please page neurology on call as listed in AMION. 02/15/2015, 11:14 PM

## 2015-02-15 NOTE — ED Notes (Signed)
Unsuccessful blood draw, not enough blood in return

## 2015-02-15 NOTE — ED Notes (Addendum)
Pt found by [employer] cable guy about 1 hour ago confused and weak. Pt confused on arrival unable to describe what happened. Has hx of seizure with known confusion after seizures. Pt has been confused for EMS for more than 59mins and appears to have facial droop. Pt restless in bed pulling at IV and lines. Bed alarm placed on bed.

## 2015-02-15 NOTE — ED Notes (Signed)
Neurology at bedside.

## 2015-02-15 NOTE — ED Notes (Signed)
Bed: NM07 Expected date:  Expected time:  Means of arrival:  Comments: EMS-disoriented

## 2015-02-15 NOTE — ED Provider Notes (Signed)
CSN: [id number]     Arrival date & time 02/15/15  1440 History   First MD Initiated Contact with Patient 02/15/15 1457     Chief Complaint  Patient presents with  . Altered Mental Status     (Consider location/radiation/quality/duration/timing/severity/associated sxs/prior Treatment) Patient is a 59 y.o. male presenting with altered mental status.  Altered Mental Status Presenting symptoms: confusion and memory loss   Severity:  Severe Most recent episode:  Today Episode history:  Continuous Timing:  Constant Progression:  Unchanged Chronicity:  New Context: not a recent illness   Context comment:  Ho seizures, prior confusion Associated symptoms: no nausea and no vomiting     Past Medical History  Diagnosis Date  . Seizures   . Hypertension   . GERD (gastroesophageal reflux disease)   . Dyslipidemia   . Schizoaffective disorder   . Depression   . Anxiety   . MRSA carrier    Past Surgical History  Procedure Laterality Date  . Self orchectomy    . Colonoscopy  2010  . Orif shoulder fracture    . Shoulder closed reduction Left 09/16/2013    Procedure: CLOSED REDUCTION SHOULDER;  Surgeon: Mauri Pole, MD;  Location: [facility];  Service: Orthopedics;  Laterality: Left;  . Shoulder hemi-arthroplasty Left 09/18/2013    Procedure: LEFT SHOULDER HEMI-ARTHROPLASTY;  Surgeon: Augustin Schooling, MD;  Location: [place];  Service: Orthopedics;  Laterality: Left;  . Carpal tunnel release     Family History  Problem Relation Age of Onset  . Stroke Father     Living at 34  . Stroke Mother     Stroke in late 44's. Died in her 22's   History  Substance Use Topics  . Smoking status: Never Smoker   . Smokeless tobacco: Never Used  . Alcohol Use: No    Review of Systems  Unable to perform ROS: Mental status change  Gastrointestinal: Negative for nausea and vomiting.  Psychiatric/Behavioral: Positive for memory loss and confusion.      Allergies  Codeine and Sulfa  antibiotics  Home Medications   Prior to Admission medications   Medication Sig Start Date End Date Taking? Authorizing Provider  clonazePAM (KLONOPIN) 2 MG tablet Take 2 mg by mouth 4 (four) times daily.   Yes Historical Provider, MD  phenytoin (DILANTIN) 50 MG tablet Chew 4 tablets (200 mg total) by mouth at bedtime. 11/29/14  Yes Frazier Richards, MD  QUEtiapine (SEROQUEL) 400 MG tablet Take 1 tablet (400 mg total) by mouth 2 (two) times daily. 01/06/15  Yes Lavon Paganini, MD  amitriptyline (ELAVIL) 150 MG tablet Take 2 tablets (300 mg total) by mouth at bedtime. 11/29/14   Frazier Richards, MD  amoxicillin-clavulanate (AUGMENTIN) 875-125 MG per tablet Take 1 tablet by mouth every 12 (twelve) hours. 01/06/15   Lavon Paganini, MD  aspirin EC 81 MG tablet Take 81 mg by mouth daily.    Historical Provider, MD  bisacodyl (DULCOLAX) 5 MG EC tablet Take 1 tablet (5 mg total) by mouth daily as needed for moderate constipation. 01/06/15   Lavon Paganini, MD  clonazePAM (KLONOPIN) 0.5 MG tablet Take 1 tablet (0.5 mg total) by mouth 2 (two) times daily. 01/06/15   Lavon Paganini, MD  docusate sodium (COLACE) 100 MG capsule Take 1 capsule (100 mg total) by mouth 2 (two) times daily. 12/31/14   Leeanne Rio, MD  Lacosamide 100 MG TABS Take 1 tablet (100 mg total) by mouth 2 (two) times daily.  01/24/15   Ward Givens, NP  metoprolol succinate (TOPROL-XL) 50 MG 24 hr tablet TAKE 1 TABLET (50 MG TOTAL) BY MOUTH AT BEDTIME. TAKE WITH OR IMMEDIATELY FOLLOWING A MEAL. 02/10/15   Leeanne Rio, MD  phenytoin (DILANTIN) 100 MG ER capsule Take 2 capsules (200 mg total) by mouth at bedtime. 01/24/15   Ward Givens, NP  polyethylene glycol (MIRALAX / GLYCOLAX) packet Take 17 g by mouth 2 (two) times daily as needed. For constipation. Mix into 8 ounces of fluid & drink 12/31/14   Leeanne Rio, MD  ramelteon (ROZEREM) 8 MG tablet Take 1 tablet (8 mg total) by mouth at bedtime. 05/19/14   Melony Overly,  MD  senna (SENOKOT) 8.6 MG TABS tablet Take 1 tablet (8.6 mg total) by mouth daily. 01/06/15   Lavon Paganini, MD  simvastatin (ZOCOR) 40 MG tablet Take 1 tablet (40 mg total) by mouth at bedtime. 11/29/14   Frazier Richards, MD  testosterone cypionate (DEPOTESTOTERONE CYPIONATE) 200 MG/ML injection Inject 2 mLs (400 mg total) into the muscle every 14 (fourteen) days. 05/17/14   Nena Polio, PA-C   BP 110/67 mmHg  Pulse 79  Temp(Src) 98.7 F (37.1 C) (Oral)  Resp 20  SpO2 100% Physical Exam  Constitutional: He is oriented to person, place, and time. He appears well-developed and well-nourished.  HENT:  Head: Normocephalic and atraumatic.  Eyes: Conjunctivae and EOM are normal.  Neck: Normal range of motion. Neck supple.  Cardiovascular: Normal rate, regular rhythm and normal heart sounds.   Pulmonary/Chest: Effort normal and breath sounds normal. No respiratory distress.  Abdominal: He exhibits no distension. There is no tenderness. There is no rebound and no guarding.  Musculoskeletal: Normal range of motion.  Neurological: He is alert and oriented to person, place, and time. He has normal reflexes. No cranial nerve deficit or sensory deficit. He exhibits abnormal muscle tone (decreased strength 4/5 in LUE, normal otherwise).  Skin: Skin is warm and dry.  Vitals reviewed.   ED Course  Procedures (including critical care time) Labs Review Labs Reviewed  COMPREHENSIVE METABOLIC PANEL - Abnormal; Notable for the following:    GFR calc non Af Amer 75 (*)    GFR calc Af Amer 87 (*)    All other components within normal limits  URINE RAPID DRUG SCREEN (HOSP PERFORMED) - Abnormal; Notable for the following:    Benzodiazepines POSITIVE (*)    All other components within normal limits  CBC WITH DIFFERENTIAL/PLATELET  URINALYSIS, ROUTINE W REFLEX MICROSCOPIC  PHENYTOIN LEVEL, TOTAL  ETHANOL  ETHANOL  CBG MONITORING, ED    Imaging Review Dg Chest 2 View  02/15/2015   CLINICAL DATA:   Altered mental status. History seizures. Schizoaffective disorder. Hypertension. Anxiety and depression.  EXAM: CHEST  2 VIEW  COMPARISON:  01/17/2015  FINDINGS: right proximal humerus fixation. Left glenohumeral joint arthroplasty. Midline trachea. Normal heart size. No pleural effusion or pneumothorax. Left base volume loss with patchy scarring, similar. No new pulmonary opacity.  IMPRESSION: No acute cardiopulmonary disease.  Left base scarring.   Electronically Signed   By: Abigail Miyamoto M.D.   On: 02/15/2015 16:34   Ct Head Wo Contrast  02/15/2015   CLINICAL DATA:  Altered mental status, slurred speech  EXAM: CT HEAD WITHOUT CONTRAST  TECHNIQUE: Contiguous axial images were obtained from the base of the skull through the vertex without intravenous contrast.  COMPARISON:  01/03/2015  FINDINGS: There is no evidence of mass effect, midline shift, or  extra-axial fluid collections. There is no evidence of a space-occupying lesion or intracranial hemorrhage. There is no evidence of a cortical-based area of acute infarction. There is mild generalized cerebral atrophy.  The ventricles and sulci are appropriate for the patient's age. The basal cisterns are patent.  Visualized portions of the orbits are unremarkable. The visualized portions of the paranasal sinuses and mastoid air cells are unremarkable.  The osseous structures are unremarkable.  IMPRESSION: No acute intracranial pathology.   Electronically Signed   By: Kathreen Devoid   On: 02/15/2015 16:36     EKG Interpretation None      MDM   Final diagnoses:  Altered mental status, unspecified altered mental status type    59 y.o. male with pertinent PMH of seizures presents with altered mental status.  Pt found confused by cable guy today prior to arrival, appeared post ictal/confused by EMS.  These symptoms improved such that on my examination the pt is confused, but is oriented x 3.  No focal neuro deficits  Elba Barman as above unremarkable.  Pt still  confused and with tangential thought.  Cannot elaborate on why he is here despite numerous repeat promptings.  Consulted neuro who evaluated the pt and feel eeg in the am warranted with medical admission.  Consulted family medicine for admission.  I have reviewed all laboratory and imaging studies if ordered as above  1. Altered mental status, unspecified altered mental status type         Debby Freiberg, MD 02/16/15 [phone]

## 2015-02-15 NOTE — ED Notes (Signed)
Pt coughing when eating cracker. Fail swallow screen.

## 2015-02-15 NOTE — ED Notes (Signed)
Nurse drawing lab from IV

## 2015-02-15 NOTE — ED Notes (Signed)
Pt continues to constantly try to get up out of bed. Speaking of people who came in his room and told him that he is allowed to go to dining hall or gave him a phone to use. None of which has happened. Patient becomes very agiated when you try to re-orient him and tell him that no such person came in the room and told him those things.

## 2015-02-16 ENCOUNTER — Inpatient Hospital Stay (HOSPITAL_COMMUNITY): Payer: Medicaid Other

## 2015-02-16 DIAGNOSIS — G40909 Epilepsy, unspecified, not intractable, without status epilepticus: Secondary | ICD-10-CM | POA: Diagnosis present

## 2015-02-16 DIAGNOSIS — K219 Gastro-esophageal reflux disease without esophagitis: Secondary | ICD-10-CM | POA: Diagnosis present

## 2015-02-16 DIAGNOSIS — Z7982 Long term (current) use of aspirin: Secondary | ICD-10-CM | POA: Diagnosis not present

## 2015-02-16 DIAGNOSIS — Z96612 Presence of left artificial shoulder joint: Secondary | ICD-10-CM | POA: Diagnosis present

## 2015-02-16 DIAGNOSIS — R4182 Altered mental status, unspecified: Secondary | ICD-10-CM | POA: Diagnosis present

## 2015-02-16 DIAGNOSIS — I10 Essential (primary) hypertension: Secondary | ICD-10-CM | POA: Diagnosis present

## 2015-02-16 DIAGNOSIS — F251 Schizoaffective disorder, depressive type: Secondary | ICD-10-CM | POA: Diagnosis present

## 2015-02-16 DIAGNOSIS — R40244 Other coma, without documented Glasgow coma scale score, or with partial score reported: Secondary | ICD-10-CM

## 2015-02-16 DIAGNOSIS — Z22322 Carrier or suspected carrier of Methicillin resistant Staphylococcus aureus: Secondary | ICD-10-CM | POA: Diagnosis not present

## 2015-02-16 DIAGNOSIS — E785 Hyperlipidemia, unspecified: Secondary | ICD-10-CM | POA: Diagnosis present

## 2015-02-16 DIAGNOSIS — F418 Other specified anxiety disorders: Secondary | ICD-10-CM | POA: Diagnosis present

## 2015-02-16 LAB — BASIC METABOLIC PANEL
Anion gap: 6 (ref 5–15)
BUN: 13 mg/dL (ref 6–23)
CO2: 26 mmol/L (ref 19–32)
Calcium: 9 mg/dL (ref 8.4–10.5)
Chloride: 107 mmol/L (ref 96–112)
Creatinine, Ser: 1.21 mg/dL (ref 0.50–1.35)
GFR calc Af Amer: 73 mL/min — ABNORMAL LOW (ref >90)
GFR calc non Af Amer: 63 mL/min — ABNORMAL LOW (ref >90)
Glucose, Bld: 93 mg/dL (ref 70–99)
Potassium: 4.4 mmol/L (ref 3.5–5.1)
Sodium: 139 mmol/L (ref 135–145)

## 2015-02-16 LAB — CBC
HCT: 40.5 % (ref 39.0–52.0)
Hemoglobin: 14 g/dL (ref 13.0–17.0)
MCH: 32 pg (ref 26.0–34.0)
MCHC: 34.6 g/dL (ref 30.0–36.0)
MCV: 92.5 fL (ref 78.0–100.0)
Platelets: 183 10*3/uL (ref 150–400)
RBC: 4.38 MIL/uL (ref 4.22–5.81)
RDW: 14.4 % (ref 11.5–15.5)
WBC: 6.6 10*3/uL (ref 4.0–10.5)

## 2015-02-16 LAB — AMMONIA: Ammonia: <9 umol/L — ABNORMAL LOW (ref 11–32)

## 2015-02-16 LAB — VITAMIN B12: Vitamin B-12: 432 pg/mL (ref 211–911)

## 2015-02-16 LAB — TSH: TSH: 3.882 u[IU]/mL (ref 0.350–4.500)

## 2015-02-16 MED ORDER — QUETIAPINE FUMARATE 200 MG PO TABS
400.0000 mg | ORAL_TABLET | Freq: Two times a day (BID) | ORAL | Status: DC
  Administered 2015-02-16 – 2015-02-17 (×2): 400 mg via ORAL
  Filled 2015-02-16: qty 2
  Filled 2015-02-16: qty 8

## 2015-02-16 MED ORDER — SODIUM CHLORIDE 0.9 % IJ SOLN
3.0000 mL | Freq: Two times a day (BID) | INTRAMUSCULAR | Status: DC
  Administered 2015-02-16 (×2): 3 mL via INTRAVENOUS

## 2015-02-16 MED ORDER — METOPROLOL SUCCINATE ER 25 MG PO TB24
50.0000 mg | ORAL_TABLET | Freq: Every day | ORAL | Status: DC
  Filled 2015-02-16: qty 2

## 2015-02-16 MED ORDER — LACOSAMIDE 50 MG PO TABS: 100.0000 mg | ORAL_TABLET | Freq: Two times a day (BID) | ORAL | Status: DC

## 2015-02-16 MED ORDER — ASPIRIN EC 81 MG PO TBEC
81.0000 mg | DELAYED_RELEASE_TABLET | Freq: Every day | ORAL | Status: DC
  Administered 2015-02-17: 81 mg via ORAL
  Filled 2015-02-16: qty 1

## 2015-02-16 MED ORDER — ENOXAPARIN SODIUM 40 MG/0.4ML ~~LOC~~ SOLN
40.0000 mg | SUBCUTANEOUS | Status: DC
  Administered 2015-02-16 – 2015-02-17 (×2): 40 mg via SUBCUTANEOUS
  Filled 2015-02-16 (×2): qty 0.4

## 2015-02-16 MED ORDER — LACOSAMIDE 50 MG PO TABS
150.0000 mg | ORAL_TABLET | Freq: Two times a day (BID) | ORAL | Status: DC
  Administered 2015-02-16 – 2015-02-17 (×2): 150 mg via ORAL
  Filled 2015-02-16 (×2): qty 3

## 2015-02-16 MED ORDER — RAMELTEON 8 MG PO TABS
8.0000 mg | ORAL_TABLET | Freq: Every day | ORAL | Status: DC
  Administered 2015-02-16: 8 mg via ORAL
  Filled 2015-02-16 (×2): qty 1

## 2015-02-16 MED ORDER — AMITRIPTYLINE HCL 25 MG PO TABS
50.0000 mg | ORAL_TABLET | Freq: Every day | ORAL | Status: DC
  Administered 2015-02-16: 50 mg via ORAL
  Filled 2015-02-16: qty 2

## 2015-02-16 MED ORDER — SIMVASTATIN 40 MG PO TABS
40.0000 mg | ORAL_TABLET | Freq: Every day | ORAL | Status: DC
  Administered 2015-02-16: 40 mg via ORAL
  Filled 2015-02-16: qty 1

## 2015-02-16 MED ORDER — DOCUSATE SODIUM 100 MG PO CAPS
100.0000 mg | ORAL_CAPSULE | Freq: Two times a day (BID) | ORAL | Status: DC
  Administered 2015-02-16 – 2015-02-17 (×2): 100 mg via ORAL
  Filled 2015-02-16 (×2): qty 1

## 2015-02-16 MED ORDER — CLONAZEPAM 1 MG PO TABS
2.0000 mg | ORAL_TABLET | Freq: Four times a day (QID) | ORAL | Status: DC
  Administered 2015-02-16 – 2015-02-17 (×5): 2 mg via ORAL
  Filled 2015-02-16 (×5): qty 2

## 2015-02-16 MED ORDER — SENNA 8.6 MG PO TABS
1.0000 | ORAL_TABLET | Freq: Every day | ORAL | Status: DC
  Administered 2015-02-17: 8.6 mg via ORAL
  Filled 2015-02-16: qty 1

## 2015-02-16 MED ORDER — PHENYTOIN SODIUM EXTENDED 100 MG PO CAPS
200.0000 mg | ORAL_CAPSULE | Freq: Every day | ORAL | Status: DC
  Administered 2015-02-16: 200 mg via ORAL
  Filled 2015-02-16: qty 2

## 2015-02-16 NOTE — Evaluation (Signed)
Clinical/Bedside Swallow Evaluation Patient Details  Name: [name] MRN: [id number] Date of Birth: 12/24/1955  Today's Date: 02/16/2015 Time: SLP Start Time (ACUTE ONLY): 34 SLP Stop Time (ACUTE ONLY): 1102 SLP Time Calculation (min) (ACUTE ONLY): 26 min  Past Medical History:  Past Medical History  Diagnosis Date  . Seizures   . Hypertension   . GERD (gastroesophageal reflux disease)   . Dyslipidemia   . Schizoaffective disorder   . Depression   . Anxiety   . MRSA carrier    Past Surgical History:  Past Surgical History  Procedure Laterality Date  . Self orchectomy    . Colonoscopy  2010  . Orif shoulder fracture    . Shoulder closed reduction Left 09/16/2013    Procedure: CLOSED REDUCTION SHOULDER;  Surgeon: Mauri Pole, MD;  Location: [facility];  Service: Orthopedics;  Laterality: Left;  . Shoulder hemi-arthroplasty Left 09/18/2013    Procedure: LEFT SHOULDER HEMI-ARTHROPLASTY;  Surgeon: Augustin Schooling, MD;  Location: [place];  Service: Orthopedics;  Laterality: Left;  . Carpal tunnel release     HPI:  [name] is a 59 y.o. male presenting with seizures and AMS. Patient reports that last night he had 4 seizures in 4 hours. He then felt very confused and called EMS (per EDP he was found by cable guy). In the ED he was found to be very somnolent and confused. He cleared somewhat but continued to be very tangential so he was transferred from [facility] for admission. He reports still feeling somewhat confused and is having trouble thinking of words but reports he has that all the time. He denies any symptoms leading up to the seizures but reports that until a few months ago he was seizure free for 8 years and has started having seizures following some recent medication changes. Denies fever, HA, n/v/d.  Pt failed the RN swallow screen on admission and SLP swallow eval was ordered.   Assessment / Plan / Recommendation Clinical Impression  Pt appears to have normal swallow function at  this time.  Swallow initiation is timely, good laryngeal elevation palpated, no cough or throat clearing noted, and voice quality remained clear throughout this session.      Aspiration Risk  None    Diet Recommendation Regular;Thin liquid   Liquid Administration via: Cup;Straw Medication Administration: Whole meds with liquid Supervision: Intermittent supervision to cue for compensatory strategies;Patient able to self feed Compensations: Slow rate;Small sips/bites Postural Changes and/or Swallow Maneuvers: Seated upright 90 degrees    Other  Recommendations Oral Care Recommendations: Oral care BID;Patient independent with oral care Other Recommendations: Clarify dietary restrictions   Follow Up Recommendations  None    Frequency and Duration        Pertinent Vitals/Pain N/a         Swallow Study Prior Functional Status       General HPI: [name] is a 59 y.o. male presenting with seizures and AMS. Patient reports that last night he had 4 seizures in 4 hours. He then felt very confused and called EMS (per EDP he was found by cable guy). In the ED he was found to be very somnolent and confused. He cleared somewhat but continued to be very tangential so he was transferred from [facility] for admission. He reports still feeling somewhat confused and is having trouble thinking of words but reports he has that all the time. He denies any symptoms leading up to the seizures but reports that until  a few months ago he was seizure free for 8 years and has started having seizures following some recent medication changes. Denies fever, HA, n/v/d.  Pt failed the RN swallow screen on admission and SLP swallow eval was ordered. Type of Study: Bedside swallow evaluation Previous Swallow Assessment: none Diet Prior to this Study: NPO Temperature Spikes Noted: No Respiratory Status: Room air History of Recent Intubation: No Behavior/Cognition: Alert;Cooperative (Quite verbose.) Oral Cavity -  Dentition: Adequate natural dentition Self-Feeding Abilities: Able to feed self Patient Positioning: Upright in bed Baseline Vocal Quality: Clear Volitional Cough: Strong Volitional Swallow: Able to elicit    Oral/Motor/Sensory Function Overall Oral Motor/Sensory Function: Appears within functional limits for tasks assessed   Ice Chips Ice chips: Within functional limits   Thin Liquid Thin Liquid: Within functional limits Presentation: Cup;Straw    Nectar Thick Nectar Thick Liquid: Not tested   Honey Thick Honey Thick Liquid: Not tested   Puree Puree: Within functional limits Presentation: Spoon   Solid   GO    Solid: Within functional limits Presentation: Self Loyal Buba, Jerni Selmer T 02/16/2015,11:09 AM

## 2015-02-16 NOTE — ED Notes (Signed)
[facility] en route, ETA 10-15 minutes.

## 2015-02-16 NOTE — H&P (Signed)
[facility] Admission History and Physical Service Pager: [phone]  Patient name: [name] Medical record number: [id number] Date of birth: 10/21/1956 Age: 59 y.o. Gender: male  Primary Care Provider: Chrisandra Netters, MD Consultants: neuro Code Status: full  Chief Complaint: seizure, AMS  Assessment and Plan: [name] is a 59 y.o. male presenting with seizure and AMS. PMH is significant for seizure disorder, severe depression, anxiety, schizoaffective disorder, HTN, HLD.  # AMS/seizure: Reports 4 seizures in 4 hours, tangential with prolonged postictal state in ED. Dilantin level 14. Only recent med change is decreased klonopin dose ~2 months ago, multiple seizures since then. Close to baseline by the time of my eval - neurology consulting, appreciate recs - EEG in am - continue home dilantin, vimpat, klonopin - vimpat level, ammonia, B12, TSH ordered  # Schizoaffective/depression/anxiety: - continue home seroquel, elavil, rozerem  FEN/GI: NPO pending RN swallow screen then heart healthy, SLIV Prophylaxis: lovenox  Disposition: admit to FPTS, Dr. Erin Hearing attending  History of Present Illness: [name] is a 58 y.o. male presenting with seizures and AMS. Patient reports that last night he had 4 seizures in 4 hours. He then felt very confused and called EMS (per EDP he was found by cable guy). In the ED he was found to be very somnolent and confused. He cleared somewhat but continued to be very tangential so he was transferred from [facility] for admission. He reports still feeling somewhat confused and is having trouble thinking of words but reports he has that all the time. He denies any symptoms leading up to the seizures but reports that until a few months ago he was seizure free for 8 years and has started having seizures following some recent medication changes. Denies fever, HA, n/v/d.  Review Of Systems: Per HPI Otherwise 12 point review of  systems was performed and was unremarkable.  Patient Active Problem List   Diagnosis Date Noted  . Blood poisoning   . HCAP (healthcare-associated pneumonia)   . MDD (major depressive disorder), recurrent severe, without psychosis   . OCD (obsessive compulsive disorder)   . Confusion 12/31/2014  . Seizures   . Schizoaffective disorder, depressive type   . Orthostatic hypotension   . Hyperlipidemia   . Falls   . HLD (hyperlipidemia)   . Seizure 11/18/2014  . Schizoaffective disorder, unspecified type   . Essential hypertension   . Overdose of beta-adrenergic antagonist drug 10/22/2014  . Altered mental status   . Overdose   . Nocturnal enuresis 03/23/2014  . Benzodiazepine dependence, continuous 01/27/2014  . Opiate dependence 01/22/2014  . Severe major depression 01/21/2014  . Frozen shoulder syndrome 09/20/2013  . Somnolence 09/20/2013  . Schizoaffective disorder 09/20/2013  . Malaise and fatigue 04/21/2013  . Presbyopia 04/25/2012  . Androgen deficiency 09/07/2011  . Constipation, chronic 09/07/2011  . Other specified disorders of liver 12/30/2008  . Overweight 09/08/2008  . HYPERLIPIDEMIA 01/28/2008  . HYPERTENSION 01/28/2008  . Seizure disorder 01/28/2008  . OSTEOPENIA 07/01/2007   Past Medical History: Past Medical History  Diagnosis Date  . Seizures   . Hypertension   . GERD (gastroesophageal reflux disease)   . Dyslipidemia   . Schizoaffective disorder   . Depression   . Anxiety   . MRSA carrier    Past Surgical History: Past Surgical History  Procedure Laterality Date  . Self orchectomy    . Colonoscopy  2010  . Orif shoulder fracture    . Shoulder closed reduction  Left 09/16/2013    Procedure: CLOSED REDUCTION SHOULDER;  Surgeon: Mauri Pole, MD;  Location: [facility];  Service: Orthopedics;  Laterality: Left;  . Shoulder hemi-arthroplasty Left 09/18/2013    Procedure: LEFT SHOULDER HEMI-ARTHROPLASTY;  Surgeon: Augustin Schooling, MD;  Location: [facility];   Service: Orthopedics;  Laterality: Left;  . Carpal tunnel release     Social History: History  Substance Use Topics  . Smoking status: Never Smoker   . Smokeless tobacco: Never Used  . Alcohol Use: No   Please also refer to relevant sections of EMR.  Family History: Family History  Problem Relation Age of Onset  . Stroke Father     Living at 37  . Stroke Mother     Stroke in late 23's. Died in her 18's   Allergies and Medications: Allergies  Allergen Reactions  . Codeine Nausea And Vomiting  . Sulfa Antibiotics Nausea And Vomiting   No current facility-administered medications on file prior to encounter.   Current Outpatient Prescriptions on File Prior to Encounter  Medication Sig Dispense Refill  . clonazePAM (KLONOPIN) 2 MG tablet Take 2 mg by mouth 4 (four) times daily.    . phenytoin (DILANTIN) 50 MG tablet Chew 4 tablets (200 mg total) by mouth at bedtime. 120 tablet 6  . QUEtiapine (SEROQUEL) 400 MG tablet Take 1 tablet (400 mg total) by mouth 2 (two) times daily. 60 tablet 0  . amitriptyline (ELAVIL) 150 MG tablet Take 2 tablets (300 mg total) by mouth at bedtime. (Patient not taking: Reported on 02/16/2015) 60 tablet 3  . amoxicillin-clavulanate (AUGMENTIN) 875-125 MG per tablet Take 1 tablet by mouth every 12 (twelve) hours. 7 tablet 0  . aspirin EC 81 MG tablet Take 81 mg by mouth daily.    . bisacodyl (DULCOLAX) 5 MG EC tablet Take 1 tablet (5 mg total) by mouth daily as needed for moderate constipation. 30 tablet 0  . clonazePAM (KLONOPIN) 0.5 MG tablet Take 1 tablet (0.5 mg total) by mouth 2 (two) times daily. 60 tablet 0  . docusate sodium (COLACE) 100 MG capsule Take 1 capsule (100 mg total) by mouth 2 (two) times daily. 60 capsule 0  . Lacosamide 100 MG TABS Take 1 tablet (100 mg total) by mouth 2 (two) times daily. 60 tablet 3  . metoprolol succinate (TOPROL-XL) 50 MG 24 hr tablet TAKE 1 TABLET (50 MG TOTAL) BY MOUTH AT BEDTIME. TAKE WITH OR IMMEDIATELY  FOLLOWING A MEAL. 30 tablet 1  . phenytoin (DILANTIN) 100 MG ER capsule Take 2 capsules (200 mg total) by mouth at bedtime. 60 capsule 3  . polyethylene glycol (MIRALAX / GLYCOLAX) packet Take 17 g by mouth 2 (two) times daily as needed. For constipation. Mix into 8 ounces of fluid & drink 100 each 6  . ramelteon (ROZEREM) 8 MG tablet Take 1 tablet (8 mg total) by mouth at bedtime. 30 tablet 2  . senna (SENOKOT) 8.6 MG TABS tablet Take 1 tablet (8.6 mg total) by mouth daily. 120 each 0  . simvastatin (ZOCOR) 40 MG tablet Take 1 tablet (40 mg total) by mouth at bedtime. 30 tablet 6  . testosterone cypionate (DEPOTESTOTERONE CYPIONATE) 200 MG/ML injection Inject 2 mLs (400 mg total) into the muscle every 14 (fourteen) days. 10 mL 0    Objective: BP 123/66 mmHg  Pulse 63  Temp(Src) 98.2 F (36.8 C) (Oral)  Resp 18  Ht 5\' 11"  (1.803 m)  Wt 174 lb 9.6 oz (79.198 kg)  BMI 24.36 kg/m2  SpO2 97% Exam: General: middle aged man, lying in bed, nad HEENT: NCAT, MMM, PERRL, EOMI Cardiovascular: RRR, no MRG Respiratory: CTAB, normal WOB Abdomen: soft, NTND, normal bowel sounds Extremities: WWP, no LE edema Skin: intact, no rashes Neuro: alert and oriented, somewhat tangential and loses his train of thought but appears to be at his baseline based on my prior exams, no focal deficits  Labs and Imaging: CBC BMET   Recent Labs Lab 02/15/15 1537  WBC 5.1  HGB 14.2  HCT 40.8  PLT 212    Recent Labs Lab 02/15/15 1537  NA 139  K 4.8  CL 107  CO2 25  BUN 19  CREATININE 1.05  GLUCOSE 92  CALCIUM 8.8    Phenytoin level 14.0 Ethanol <5 UDS: Benzo positive  CXR: No acute cardiopulmonary disease. Left base scarring.  CT head: No acute intracranial pathology.  Frazier Richards, MD 02/16/2015, 3:54 AM PGY-2, [place] Intern pager: [phone], text pages welcome

## 2015-02-16 NOTE — Progress Notes (Signed)
Subjective: pateint currently back to his baseline and no further siezure activity over night.   Objective: Current vital signs: BP 126/62 mmHg  Pulse 63  Temp(Src) 97.8 F (36.6 C) (Oral)  Resp 20  Ht 5\' 11"  (1.803 m)  Wt 79.198 kg (174 lb 9.6 oz)  BMI 24.36 kg/m2  SpO2 94% Vital signs in last 24 hours: Temp:  [97.7 F (36.5 C)-98.7 F (37.1 C)] 97.8 F (36.6 C) (03/02 1019) Pulse Rate:  [62-79] 63 (03/02 1019) Resp:  [14-25] 20 (03/02 1019) BP: (103-138)/(57-82) 126/62 mmHg (03/02 1019) SpO2:  [90 %-100 %] 94 % (03/02 1019) Weight:  [79.198 kg (174 lb 9.6 oz)] 79.198 kg (174 lb 9.6 oz) (03/02 0222)  Intake/Output from previous day: 03/01 0701 - 03/02 0700 In: -  Out: 350 [Urine:350] Intake/Output this shift:   Nutritional status: Diet regular  Neurologic Exam: General: Mental Status: Alert, oriented, thought content appropriate.  Speech fluent without evidence of aphasia.  Able to follow 3 step commands without difficulty. Cranial Nerves: II: Discs flat bilaterally; Visual fields grossly normal, pupils equal, round, reactive to light and accommodation III,IV, VI: ptosis not present, extra-ocular motions intact bilaterally V,VII: smile symmetric, facial light touch sensation normal bilaterally VIII: hearing normal bilaterally IX,X: gag reflex present XI: bilateral shoulder shrug XII: midline tongue extension without atrophy or fasciculations  Motor: Right : Upper extremity   5/5    Left:     Upper extremity   5/5  Lower extremity   5/5     Lower extremity   5/5 Tone and bulk:normal tone throughout; no atrophy noted Sensory: Pinprick and light touch intact throughout, bilaterally Deep Tendon Reflexes:  Right: Upper Extremity   Left: Upper extremity   biceps (C-5 to C-6) 2/4   biceps (C-5 to C-6) 2/4 tricep (C7) 2/4    triceps (C7) 2/4 Brachioradialis (C6) 2/4  Brachioradialis (C6) 2/4  Lower Extremity Lower Extremity  quadriceps (L-2 to L-4) 2/4   quadriceps  (L-2 to L-4) 2/4 Achilles (S1) 1/4   Achilles (S1) 1/4  Plantars: Right: downgoing   Left: downgoing Cerebellar: normal finger-to-nose,  normal heel-to-shin test    Lab Results: Basic Metabolic Panel:  Recent Labs Lab 02/15/15 1537 02/16/15 0550  NA 139 139  K 4.8 4.4  CL 107 107  CO2 25 26  GLUCOSE 92 93  BUN 19 13  CREATININE 1.05 1.21  CALCIUM 8.8 9.0    Liver Function Tests:  Recent Labs Lab 02/15/15 1537  AST 34  ALT 22  ALKPHOS 112  BILITOT 1.0  PROT 7.1  ALBUMIN 4.5   No results for input(s): LIPASE, AMYLASE in the last 168 hours.  Recent Labs Lab 02/16/15 0550  AMMONIA <9*    CBC:  Recent Labs Lab 02/15/15 1537 02/16/15 0550  WBC 5.1 6.6  NEUTROABS 3.5  --   HGB 14.2 14.0  HCT 40.8 40.5  MCV 93.2 92.5  PLT 212 183    Cardiac Enzymes: No results for input(s): CKTOTAL, CKMB, CKMBINDEX, TROPONINI in the last 168 hours.  Lipid Panel: No results for input(s): CHOL, TRIG, HDL, CHOLHDL, VLDL, LDLCALC in the last 168 hours.  CBG:  Recent Labs Lab 02/15/15 1454  GLUCAP 88    Microbiology: Results for orders placed or performed during the hospital encounter of 01/02/15  Culture, blood (routine x 2)     Status: None   Collection Time: 01/02/15  6:56 PM  Result Value Ref Range Status   Specimen Description BLOOD RIGHT  ARM  Final   Special Requests BOTTLES DRAWN AEROBIC AND ANAEROBIC 5CC  Final   Culture   Final    NO GROWTH 5 DAYS Performed at [facility]    Report Status 01/10/2015 FINAL  Final  Culture, blood (routine x 2)     Status: None   Collection Time: 01/02/15  7:14 PM  Result Value Ref Range Status   Specimen Description BLOOD R HAND  Final   Special Requests BOTTLES DRAWN AEROBIC AND ANAEROBIC 3ML  Final   Culture   Final    NO GROWTH 5 DAYS Performed at [facility]    Report Status 01/10/2015 FINAL  Final  Urine culture     Status: None   Collection Time: 01/02/15  7:33 PM  Result Value Ref  Range Status   Specimen Description URINE, CATHETERIZED  Final   Special Requests NONE  Final   Colony Count NO GROWTH Performed at [facility]   Final   Culture NO GROWTH Performed at [facility]   Final   Report Status 01/03/2015 FINAL  Final  MRSA PCR Screening     Status: None   Collection Time: 01/03/15  2:03 AM  Result Value Ref Range Status   MRSA by PCR NEGATIVE NEGATIVE Final    Comment:        The GeneXpert MRSA Assay (FDA approved for NASAL specimens only), is one component of a comprehensive MRSA colonization surveillance program. It is not intended to diagnose MRSA infection nor to guide or monitor treatment for MRSA infections.     Coagulation Studies: No results for input(s): LABPROT, INR in the last 72 hours.  Imaging: Dg Chest 2 View  02/15/2015   CLINICAL DATA:  Altered mental status. History seizures. Schizoaffective disorder. Hypertension. Anxiety and depression.  EXAM: CHEST  2 VIEW  COMPARISON:  01/17/2015  FINDINGS: right proximal humerus fixation. Left glenohumeral joint arthroplasty. Midline trachea. Normal heart size. No pleural effusion or pneumothorax. Left base volume loss with patchy scarring, similar. No new pulmonary opacity.  IMPRESSION: No acute cardiopulmonary disease.  Left base scarring.   Electronically Signed   By: Abigail Miyamoto M.D.   On: 02/15/2015 16:34   Ct Head Wo Contrast  02/15/2015   CLINICAL DATA:  Altered mental status, slurred speech  EXAM: CT HEAD WITHOUT CONTRAST  TECHNIQUE: Contiguous axial images were obtained from the base of the skull through the vertex without intravenous contrast.  COMPARISON:  01/03/2015  FINDINGS: There is no evidence of mass effect, midline shift, or extra-axial fluid collections. There is no evidence of a space-occupying lesion or intracranial hemorrhage. There is no evidence of a cortical-based area of acute infarction. There is mild generalized cerebral atrophy.  The ventricles and sulci  are appropriate for the patient's age. The basal cisterns are patent.  Visualized portions of the orbits are unremarkable. The visualized portions of the paranasal sinuses and mastoid air cells are unremarkable.  The osseous structures are unremarkable.  IMPRESSION: No acute intracranial pathology.   Electronically Signed   By: Kathreen Devoid   On: 02/15/2015 16:36    Medications:  Prior to Admission:  Prescriptions prior to admission  Medication Sig Dispense Refill Last Dose  . amitriptyline (ELAVIL) 50 MG tablet Take 50 mg by mouth at bedtime.     . clonazePAM (KLONOPIN) 2 MG tablet Take 2 mg by mouth 4 (four) times daily.   Taking  . phenytoin (DILANTIN) 50 MG tablet Chew 4 tablets (  200 mg total) by mouth at bedtime. 120 tablet 6 Taking  . QUEtiapine (SEROQUEL) 400 MG tablet Take 1 tablet (400 mg total) by mouth 2 (two) times daily. 60 tablet 0 Taking  . amitriptyline (ELAVIL) 150 MG tablet Take 2 tablets (300 mg total) by mouth at bedtime. (Patient not taking: Reported on 02/16/2015) 60 tablet 3 Not Taking at Unknown time  . amoxicillin-clavulanate (AUGMENTIN) 875-125 MG per tablet Take 1 tablet by mouth every 12 (twelve) hours. 7 tablet 0 Taking  . aspirin EC 81 MG tablet Take 81 mg by mouth daily.   Taking  . bisacodyl (DULCOLAX) 5 MG EC tablet Take 1 tablet (5 mg total) by mouth daily as needed for moderate constipation. 30 tablet 0 Taking  . clonazePAM (KLONOPIN) 0.5 MG tablet Take 1 tablet (0.5 mg total) by mouth 2 (two) times daily. 60 tablet 0 Taking  . docusate sodium (COLACE) 100 MG capsule Take 1 capsule (100 mg total) by mouth 2 (two) times daily. 60 capsule 0 Taking  . Lacosamide 100 MG TABS Take 1 tablet (100 mg total) by mouth 2 (two) times daily. 60 tablet 3   . metoprolol succinate (TOPROL-XL) 50 MG 24 hr tablet TAKE 1 TABLET (50 MG TOTAL) BY MOUTH AT BEDTIME. TAKE WITH OR IMMEDIATELY FOLLOWING A MEAL. 30 tablet 1   . phenytoin (DILANTIN) 100 MG ER capsule Take 2 capsules (200 mg  total) by mouth at bedtime. 60 capsule 3   . polyethylene glycol (MIRALAX / GLYCOLAX) packet Take 17 g by mouth 2 (two) times daily as needed. For constipation. Mix into 8 ounces of fluid & drink 100 each 6 Taking  . ramelteon (ROZEREM) 8 MG tablet Take 1 tablet (8 mg total) by mouth at bedtime. 30 tablet 2 Taking  . senna (SENOKOT) 8.6 MG TABS tablet Take 1 tablet (8.6 mg total) by mouth daily. 120 each 0 Taking  . simvastatin (ZOCOR) 40 MG tablet Take 1 tablet (40 mg total) by mouth at bedtime. 30 tablet 6 Taking  . testosterone cypionate (DEPOTESTOTERONE CYPIONATE) 200 MG/ML injection Inject 2 mLs (400 mg total) into the muscle every 14 (fourteen) days. 10 mL 0 Taking   Scheduled: . amitriptyline  50 mg Oral QHS  . aspirin EC  81 mg Oral Daily  . clonazePAM  2 mg Oral QID  . docusate sodium  100 mg Oral BID  . enoxaparin (LOVENOX) injection  40 mg Subcutaneous Q24H  . lacosamide  100 mg Oral BID  . metoprolol succinate  50 mg Oral QHS  . phenytoin  200 mg Oral QHS  . QUEtiapine  400 mg Oral BID  . ramelteon  8 mg Oral QHS  . senna  1 tablet Oral Daily  . simvastatin  40 mg Oral QHS  . sodium chloride  3 mL Intravenous Q12H    Assessment/Plan: 59 YO male with known seizure disorder presenting to hospital with AMS.  Over night his AMS has resolved. HE is followed by Dr. [name] as out patient with neurology and is on  klonopin 2mg  QID, dilantin 200mg  qhs, vimpat 100mg  BID, rozerem 8mg  qhs. Recent Vimpat level 01/24/2015 was 4.7 and Dilantin level in hospital was 14.0.  TSH, B12 and Ammonia all normal. No signs of infection. EEG pending.   Etta Quill PA-C [name] [phone]  02/16/2015, 11:03 AM  59 yo M with AMS of unclear etiology. He reports similar spells following previous seizure episodes. He feels back to baseline at this time.  He is awake, alert, oriented  I suspect that he had unwitnessed seizure activity and would favor increasing vimpat to 150mg  BID.    He is on multiple sedating medications, but reports no recent changes.   No further recommendations. Ok to d/c home from neuro perspective as long as family can be with him tonight.   Roland Rack, MD [name] [phone]  If 7pm- 7am, please page neurology on call as listed in [place].

## 2015-02-16 NOTE — Progress Notes (Signed)
UR complete.  Arvie Villarruel RN, MSN 

## 2015-02-16 NOTE — Procedures (Signed)
ELECTROENCEPHALOGRAM REPORT  Patient: [name]       Room #: 3Z32 EEG No. ID: [id number] Age: 59 y.o.        Sex: male Referring Physician: Christianne Borrow Report Date:  02/16/2015        Interpreting Physician: Anthony Sar  History: [name] is an 59 y.o. male with a history of seizure disorder, schizoaffective disorder found confused at home and brought to the emergency room. Etiology remains unclear. Postictal state with suspected. Ongoing seizure activity is also concerned. Dilantin level was normal.  Indications for study:  Rule out encephalopathy; rule out seizure activity.  Technique: This is an 18 channel routine scalp EEG performed at the bedside with bipolar and monopolar montages arranged in accordance to the international 10/20 system of electrode placement.   Description: This EEG recording was performed during wakefulness and during drowsiness. Predominant background activity consisted of low amplitude diffuse irregular 1-2 Hz delta activity with superimposed 8-9 Hz recorded from the posterior head region as well as faster beta activity recorded from the central and frontal regions. During drowsiness there was a slight increase in background slowing of cerebral activity symmetrically. Photic stimulation produced symmetrical occipital driving response. Hyperventilation was not performed. No epileptiform discharges were recorded.  Interpretation: CT was abnormal with mild generalized continuous nonspecific slowing of cerebral activity, which can be seen with toxic and metabolic encephalopathies, as well as with postictal state. No seizure activity was seen.   Rush Farmer M.D. [name] [phone]

## 2015-02-16 NOTE — Progress Notes (Signed)
Physician gave permission for RN to do repeat swallow screen. Patient failed for second time and therefore has been made NPO until speech therapy in AM. Patient educated. Will continue to monitor. [place], Rande Brunt, RN

## 2015-02-16 NOTE — Discharge Summary (Signed)
[facility] Discharge Summary  Patient name: [name] Medical record number: [id number] Date of birth: February 10, 1956 Age: 59 y.o. Gender: male Date of Admission: 02/15/2015  Date of Discharge: 02/17/2015 Admitting Physician: Lind Covert, MD  Primary Care Provider: Chrisandra Netters, MD Consultants: neurology  Indication for Hospitalization: Seizures and AMS  Discharge Diagnoses/Problem List:  Patient Active Problem List   Diagnosis Date Noted  . Blood poisoning   . MDD (major depressive disorder), recurrent severe, without psychosis   . OCD (obsessive compulsive disorder)   . Confusion 12/31/2014  . Seizures   . Schizoaffective disorder, depressive type   . Orthostatic hypotension   . Hyperlipidemia   . Falls   . HLD (hyperlipidemia)   . Seizure 11/18/2014  . Schizoaffective disorder, unspecified type   . Essential hypertension   . Overdose of beta-adrenergic antagonist drug 10/22/2014  . Altered mental status   . Overdose   . Nocturnal enuresis 03/23/2014  . Benzodiazepine dependence, continuous 01/27/2014  . Opiate dependence 01/22/2014  . Severe major depression 01/21/2014  . Frozen shoulder syndrome 09/20/2013  . Somnolence 09/20/2013  . Schizoaffective disorder 09/20/2013  . Malaise and fatigue 04/21/2013  . Presbyopia 04/25/2012  . Androgen deficiency 09/07/2011  . Constipation, chronic 09/07/2011  . Other specified disorders of liver 12/30/2008  . Overweight 09/08/2008  . HYPERLIPIDEMIA 01/28/2008  . HYPERTENSION 01/28/2008  . Seizure disorder 01/28/2008  . OSTEOPENIA 07/01/2007    Disposition: Home  Discharge Condition: Improved  Discharge Exam:  Filed Vitals:   02/17/15 1316  BP: 106/64  Pulse: 80  Temp: 97.7 F (36.5 C)  Resp: 16   General: lying in bed, nad HEENT: NCAT, MMM, PERRL Cardiovascular: RRR, no murmur appreciated Respiratory: CTAB, normal WOB Abdomen: soft, NTND, normal bowel  sounds Extremities: WWP, no LE edema; dorsalis pedis/posterior tibialis pulses present and equal Skin: intact, no rashes Neuro: alert and oriented, somewhat tangential but this seems to be pt's baseline, no focal deficits  Brief Hospital Course:  SEQUOIA MINCEY is a 59 y.o. male who presented with seizure and AMS. PMH is significant for seizure disorder, severe depression, anxiety, schizoaffective disorder, HTN, HLD.  AMS/seizure: Patient had multiple seizures within short time span with consequent prolonged post-ictal state. Dilantin level 14. Only recent med change was decreased klonopin dose ~2 months ago, multiple seizures since then. neurology was consulted. EEG ordered showed no epileptiform activity. CT head and CXR normal. Vimpat was increased to 150mg  BID and other home medications (dilantin and klonopin) continued. Patient returned to baseline without further seizure event while hospitalized.   The patient's other chronic conditions, including HTN, depression/schizoaffective disorder/anxiety were stable during this hospitalization and the patient was continued on home medications.    Issues for Follow Up:  1. Monitor for breakthrough seizures on new medication regimen 2. Neurology follow-up  Significant Procedures: None  Significant Labs and Imaging:   Recent Labs Lab 02/15/15 1537 02/16/15 0550  WBC 5.1 6.6  HGB 14.2 14.0  HCT 40.8 40.5  PLT 212 183    Recent Labs Lab 02/15/15 1537 02/16/15 0550  NA 139 139  K 4.8 4.4  CL 107 107  CO2 25 26  GLUCOSE 92 93  BUN 19 13  CREATININE 1.05 1.21  CALCIUM 8.8 9.0  ALKPHOS 112  --   AST 34  --   ALT 22  --   ALBUMIN 4.5  --    Ammonia - <9 Vit B12 - 432  Dg Chest 2  View  02/15/2015   CLINICAL DATA:  Altered mental status. History seizures. Schizoaffective disorder. Hypertension. Anxiety and depression.  EXAM: CHEST  2 VIEW  COMPARISON:  01/17/2015  FINDINGS: right proximal humerus fixation. Left glenohumeral joint  arthroplasty. Midline trachea. Normal heart size. No pleural effusion or pneumothorax. Left base volume loss with patchy scarring, similar. No new pulmonary opacity.  IMPRESSION: No acute cardiopulmonary disease.  Left base scarring.   Electronically Signed   By: Abigail Miyamoto M.D.   On: 02/15/2015 16:34   Ct Head Wo Contrast  02/15/2015   CLINICAL DATA:  Altered mental status, slurred speech  EXAM: CT HEAD WITHOUT CONTRAST  TECHNIQUE: Contiguous axial images were obtained from the base of the skull through the vertex without intravenous contrast.  COMPARISON:  01/03/2015  FINDINGS: There is no evidence of mass effect, midline shift, or extra-axial fluid collections. There is no evidence of a space-occupying lesion or intracranial hemorrhage. There is no evidence of a cortical-based area of acute infarction. There is mild generalized cerebral atrophy.  The ventricles and sulci are appropriate for the patient's age. The basal cisterns are patent.  Visualized portions of the orbits are unremarkable. The visualized portions of the paranasal sinuses and mastoid air cells are unremarkable.  The osseous structures are unremarkable.  IMPRESSION: No acute intracranial pathology.   Electronically Signed   By: Kathreen Devoid   On: 02/15/2015 16:36   Results/Tests Pending at Time of Discharge: None  Discharge Medications:    Medication List    TAKE these medications        amitriptyline 50 MG tablet  Commonly known as:  ELAVIL  Take 50 mg by mouth at bedtime.     amitriptyline 150 MG tablet  Commonly known as:  ELAVIL  Take 2 tablets (300 mg total) by mouth at bedtime.     aspirin EC 81 MG tablet  Take 81 mg by mouth daily.     bisacodyl 5 MG EC tablet  Commonly known as:  DULCOLAX  Take 1 tablet (5 mg total) by mouth daily as needed for moderate constipation.     clonazePAM 2 MG tablet  Commonly known as:  KLONOPIN  Take 2 mg by mouth 4 (four) times daily.     Lacosamide 100 MG Tabs  Take 1.5  tablets (150 mg total) by mouth 2 (two) times daily.     metoprolol succinate 50 MG 24 hr tablet  Commonly known as:  TOPROL-XL  TAKE 1 TABLET (50 MG TOTAL) BY MOUTH AT BEDTIME. TAKE WITH OR IMMEDIATELY FOLLOWING A MEAL.     phenytoin 100 MG ER capsule  Commonly known as:  DILANTIN  Take 2 capsules (200 mg total) by mouth at bedtime.     polyethylene glycol packet  Commonly known as:  MIRALAX / GLYCOLAX  Take 17 g by mouth 2 (two) times daily as needed. For constipation. Mix into 8 ounces of fluid & drink     QUEtiapine 400 MG tablet  Commonly known as:  SEROQUEL  Take 1 tablet (400 mg total) by mouth 2 (two) times daily.     senna 8.6 MG Tabs tablet  Commonly known as:  SENOKOT  Take 1 tablet (8.6 mg total) by mouth as needed for moderate constipation.     simvastatin 40 MG tablet  Commonly known as:  ZOCOR  Take 1 tablet (40 mg total) by mouth at bedtime.     testosterone cypionate 200 MG/ML injection  Commonly known as:  DEPOTESTOTERONE CYPIONATE  Inject 2 mLs (400 mg total) into the muscle every 14 (fourteen) days.        Discharge Instructions: Please refer to Patient Instructions section of EMR for full details.  Patient was counseled important signs and symptoms that should prompt return to medical care, changes in medications, dietary instructions, activity restrictions, and follow up appointments.   Follow-Up Appointments: Follow-up Information    Follow up with Chrisandra Netters, MD.   Specialty:  [facility]   Contact information:   [name] [address] [phone]       Katheren Shams, [place] 02/17/2015, 11:05 PM PGY-1, [place]

## 2015-02-16 NOTE — Progress Notes (Signed)
EEG Completed; Results Pending  

## 2015-02-16 NOTE — Progress Notes (Signed)
Patient arrived to 4N09 AAOx3. Vital signs taken and tele placed. Patient is pleasant but with many questions repeating himself. Pt is comfortable and will continue to monitor. [name], Rande Brunt, RN

## 2015-02-17 DIAGNOSIS — R404 Transient alteration of awareness: Secondary | ICD-10-CM

## 2015-02-17 LAB — GLUCOSE, CAPILLARY: Glucose-Capillary: 120 mg/dL — ABNORMAL HIGH (ref 70–99)

## 2015-02-17 MED ORDER — LACOSAMIDE 100 MG PO TABS: 150.0000 mg | ORAL_TABLET | Freq: Two times a day (BID) | ORAL | Status: DC

## 2015-02-17 MED ORDER — POLYETHYLENE GLYCOL 3350 17 G PO PACK
17.0000 g | PACK | Freq: Every day | ORAL | Status: DC
  Administered 2015-02-17: 17 g via ORAL
  Filled 2015-02-17: qty 1

## 2015-02-17 MED ORDER — SENNA 8.6 MG PO TABS: 1.0000 | ORAL_TABLET | ORAL | Status: DC | PRN

## 2015-02-17 NOTE — Progress Notes (Signed)
Subjective: patient has no complaints other than having lack of sleep last night due to nurses waking him.  No further seizures. Currently has a sitter due to wondering the hallway but is clear that he was restless from sitting in bed.  He knows he is in the hospital, year, month, and why he was brought to hospital.  He states he "was told by family practice he will be discharged once social worker sees him."  Exam: Filed Vitals:   02/17/15 0553  BP: 128/75  Pulse: 74  Temp: 97.6 F (36.4 C)  Resp: 12   Gen: In bed, NAD  Mental Status: Alert, oriented, thought content appropriate.  Speech fluent without evidence of aphasia.  Able to follow 3 step commands without difficulty. Cranial Nerves: II:  Visual fields grossly normal, pupils equal, round, reactive to light and accommodation III,IV, VI: ptosis not present, extra-ocular motions intact bilaterally V,VII: smile symmetric, facial light touch sensation normal bilaterally VIII: hearing normal bilaterally IX,X: gag reflex present XI: bilateral shoulder shrug XII: midline tongue extension without atrophy or fasciculations  Motor: Right : Upper extremity   5/5    Left:     Upper extremity   5/5  Lower extremity   5/5     Lower extremity   5/5 Tone and bulk:normal tone throughout; no atrophy noted Sensory: Pinprick and light touch intact throughout, bilaterally Deep Tendon Reflexes:  Right: Upper Extremity   Left: Upper extremity   biceps (C-5 to C-6) 2/4   biceps (C-5 to C-6) 2/4 tricep (C7) 2/4    triceps (C7) 2/4 Brachioradialis (C6) 2/4  Brachioradialis (C6) 2/4  Lower Extremity Lower Extremity  quadriceps (L-2 to L-4) 2/4   quadriceps (L-2 to L-4) 2/4 Achilles (S1) 2/4   Achilles (S1) 2/4  Plantars: Right: downgoing   Left: downgoing   Pertinent Labs: No current labs  Impression: 59 yo M with AMS of unclear etiology. He reports similar spells following previous seizure episodes. He feels back to baseline at this time.  Suspect that he had unwitnessed seizure activity and would favor increasing vimpat to 150mg  BID. No further seizures. EEG shows no epileptiform activity.   Recommendations: 1) Continue Current AED regime.  2) Follow up with neurology was out patient.   Neurology S/O

## 2015-02-17 NOTE — Discharge Instructions (Signed)
Discharge Date: 02/17/2015  Reason for Hospitalization: Seizures  We have adjusted your medications. Please review medication list and take as prescribed. It is important that you follow-up with your neurologist so they can monitor your progress.   Thank you for letting us participate in your care!

## 2015-02-17 NOTE — Progress Notes (Signed)
Family Medicine Teaching Service Daily Progress Note Intern Pager: [phone]  Patient name: [name] Medical record number: [id number] Date of birth: October 20, 1956 Age: 59 y.o. Gender: male  Primary Care Provider: Chrisandra Netters, MD Consultants: neuro Code Status: full  Assessment and Plan: [name] is a 59 y.o. male presenting with seizure and AMS. PMH is significant for seizure disorder, severe depression, anxiety, schizoaffective disorder, HTN, HLD.  # AMS/seizure: Reports 4 seizures in 4 hours, tangential with prolonged postictal state in ED. Dilantin level 14. Only recent med change is decreased klonopin dose ~2 months ago, multiple seizures since then. Close to baseline by the time of my eval - neurology consulting, appreciate recs - EEG reviewed by neuro - continue home dilantin, vimpat, klonopin  - neuro increased Vimpat to 150mg  BID  - neuro signed off; deemed cleared for DC - vimpat level, ammonia, B12, TSH ordered  # Schizoaffective/depression/anxiety: - continue home seroquel, elavil, rozerem  FEN/GI: NPO pending RN swallow screen then heart healthy, SLIV Prophylaxis: lovenox   Disposition: home today  Subjective:  Patient denies any issues overnight. No complaints this AM. Was interested in going home today. Denies any symptoms at this time.  Objective: Temp:  [97.6 F (36.4 C)-98.2 F (36.8 C)] 97.7 F (36.5 C) (03/03 1316) Pulse Rate:  [69-83] 80 (03/03 1316) Resp:  [12-20] 16 (03/03 1316) BP: (102-128)/(62-79) 106/64 mmHg (03/03 1316) SpO2:  [91 %-100 %] 100 % (03/03 1316) Physical Exam: General: lying in bed, nad HEENT: NCAT, MMM, PERRL Cardiovascular: RRR, no murmur appreciated Respiratory: CTAB, normal WOB Abdomen: soft, NTND, normal bowel sounds Extremities: WWP, no LE edema; dorsalis pedis/posterior tibialis pulses present and equal Skin: intact, no rashes Neuro: alert and oriented, somewhat tangential but this seems to be pt's baseline,  no focal deficits  Laboratory:  Recent Labs Lab 02/15/15 1537 02/16/15 0550  WBC 5.1 6.6  HGB 14.2 14.0  HCT 40.8 40.5  PLT 212 183    Recent Labs Lab 02/15/15 1537 02/16/15 0550  NA 139 139  K 4.8 4.4  CL 107 107  CO2 25 26  BUN 19 13  CREATININE 1.05 1.21  CALCIUM 8.8 9.0  PROT 7.1  --   BILITOT 1.0  --   ALKPHOS 112  --   ALT 22  --   AST 34  --   GLUCOSE 92 93   Ammonia - <9 Vit B12 - [place], MD 02/17/2015, 1:40 PM PGY-1, [place] Intern pager: [phone], text pages welcome

## 2015-02-17 NOTE — Progress Notes (Signed)
Pt is being discharged home. Discharge instructions were given to patient and family 

## 2015-02-22 NOTE — Telephone Encounter (Signed)
Need to have a referral to an urologist.

## 2015-02-23 ENCOUNTER — Telehealth: Payer: Self-pay | Admitting: Family Medicine

## 2015-02-23 DIAGNOSIS — Z9079 Acquired absence of other genital organ(s): Secondary | ICD-10-CM

## 2015-02-23 NOTE — Telephone Encounter (Signed)
Patient has a history of hypogonadism from self orchiectomy. I will happily refer him to urology so that they can manage his testosterone replacement and any erectile dysfunction he may have. Referral entered. Please inform patient.  Leeanne Rio, MD

## 2015-02-23 NOTE — Telephone Encounter (Signed)
Needs referral to urologist because of erectile dysfunction / sr

## 2015-02-24 NOTE — Telephone Encounter (Signed)
Tried calling, no answer, voicemail not set up. Will fax referral to [facility].

## 2015-02-28 ENCOUNTER — Ambulatory Visit: Payer: Medicaid Other | Admitting: Adult Health

## 2015-03-03 NOTE — Telephone Encounter (Signed)
Need refill for testosterone cypionate 200 mg/ml injection.  Pharmacy to send E-request for refill

## 2015-03-03 NOTE — Telephone Encounter (Signed)
Please call patient and find out what dose of testosterone he takes. We tried to reach him about this before but were never able to reach him. I can't prescribe it until I know his dose.  Leeanne Rio, MD

## 2015-03-04 NOTE — Telephone Encounter (Signed)
Tried calling, no answer, voicemail not set up. 

## 2015-03-07 ENCOUNTER — Other Ambulatory Visit: Payer: Self-pay | Admitting: Family Medicine

## 2015-03-07 NOTE — Telephone Encounter (Signed)
Needs refill on testerone Says he has been waiting for it to be refill about 7 days ago. See no request on encounters.

## 2015-03-08 MED ORDER — TESTOSTERONE CYPIONATE 200 MG/ML IM SOLN: 400.0000 mg | INTRAMUSCULAR | Status: DC

## 2015-03-08 NOTE — Telephone Encounter (Signed)
Spoke with patient, he states he is taking 200mg /ml every 2 weeks. Will forward to PCP

## 2015-03-08 NOTE — Telephone Encounter (Signed)
Patient actual dose is 400mg . He states the directions are 200mg /66ml every 2 weeks each month

## 2015-03-08 NOTE — Telephone Encounter (Signed)
Refill printed and will fax to pt's pharmacy. Please inform pt that he must schedule a visit with me before I will prescribe this again in the future. This is a controlled substance which must be monitored for safety.  Leeanne Rio, MD

## 2015-03-08 NOTE — Telephone Encounter (Signed)
Please note 200mg /ml is not a dose, that is the concentration of the medication. I need to know the dose he is on (how many mg total, or how many ml total). Please confirm dose with pt.  Leeanne Rio, MD

## 2015-03-08 NOTE — Telephone Encounter (Signed)
See previous phone note.  

## 2015-03-09 NOTE — Telephone Encounter (Signed)
Patient informed. 

## 2015-04-25 ENCOUNTER — Telehealth: Payer: Self-pay | Admitting: Family Medicine

## 2015-04-25 NOTE — Telephone Encounter (Signed)
Pt called and needs a refill on his Toprol XL jw

## 2015-04-26 MED ORDER — METOPROLOL SUCCINATE ER 50 MG PO TB24: ORAL_TABLET | ORAL | Status: DC

## 2015-04-26 NOTE — Telephone Encounter (Signed)
Rx sent in. Thanks! Rolene Andrades J Taimi Towe, MD  

## 2015-04-29 ENCOUNTER — Ambulatory Visit (INDEPENDENT_AMBULATORY_CARE_PROVIDER_SITE_OTHER): Payer: Medicaid Other | Admitting: Nurse Practitioner

## 2015-04-29 ENCOUNTER — Encounter: Payer: Self-pay | Admitting: Nurse Practitioner

## 2015-04-29 VITALS — BP 106/72 | HR 64 | Ht 71.0 in | Wt 200.0 lb

## 2015-04-29 DIAGNOSIS — R404 Transient alteration of awareness: Secondary | ICD-10-CM

## 2015-04-29 DIAGNOSIS — R410 Disorientation, unspecified: Secondary | ICD-10-CM | POA: Diagnosis not present

## 2015-04-29 DIAGNOSIS — W19XXXD Unspecified fall, subsequent encounter: Secondary | ICD-10-CM

## 2015-04-29 DIAGNOSIS — G40909 Epilepsy, unspecified, not intractable, without status epilepticus: Secondary | ICD-10-CM

## 2015-04-29 MED ORDER — LACOSAMIDE 100 MG PO TABS
150.0000 mg | ORAL_TABLET | Freq: Two times a day (BID) | ORAL | Status: DC
Start: 2015-04-29 — End: 2015-06-03

## 2015-04-29 MED ORDER — PHENYTOIN SODIUM EXTENDED 100 MG PO CAPS: 200.0000 mg | ORAL_CAPSULE | Freq: Every day | ORAL | Status: DC

## 2015-04-29 NOTE — Progress Notes (Signed)
I have read the note, and I agree with the clinical assessment and plan.  Lexton Hidalgo KEITH   

## 2015-04-29 NOTE — Patient Instructions (Signed)
Continue Dilantin at current dose Continue Vimpat at current dose Will check EEG MRI of the brain with and without contrast

## 2015-04-29 NOTE — Progress Notes (Signed)
[facility]  PATIENT: LESS WOOLSEY DOB: 1956/05/22   REASON FOR VISIT: Follow-up for seizure disorder , altered mental status, memory loss, depression HISTORY FROM: Patient and friend    HISTORY OF PRESENT ILLNESS: Mr. Goeller, 59 year old male returns for follow-up. He was last seen in the office 01/24/2015 he has had 2 ED visits this year for altered mental status. The most recent occurred March 2 CT of the head was negative for any acute pathology however atrophy was evident. Vimpat level was 4.7 Dilantin level 14. Ammonia level within normal limits, TSH and vitamin B12 were normal. He is currently on Vimpat 150 twice daily and Dilantin 200 mg daily. He states he has had 4 seizures since his last ED visit 2 of those involved fall without  injury. He sometimes gets a headache at the onset of the seizure. He would stare off into space blankly and drop things. He denies any numbness or weakness of the face arms or legs he is continuing  to have problems with his memory. He is independent with ADLs. He recently stopped seeing his psychiatrist because of no show visits. He returns for reevaluation   01/24/15(MM)Mr. Shuck is a 59 year old male with a history of seizures. He returns today for follow-up. The patient's mediation was recently increased to Vimpat 100 mg BID. He is also on Dilantin. There was some concern about medication compliance so home health nursing was ordered for the patient. Since the last visit, the patient reports that he had not had any issues since the last visit. Although according to Epic the patient was taking to the ED at the beginning of the month for altered mental status and garbled speech. According to the notes once he was at the hospital he had returned to his baseline. Blood work was unremarkable. No evidence of seizure activity was noted but couldn't rule out post ictal phase. Patient doesn't recall why he went to the hospital. He states that when  he was having seizures frequently it really "messed up his memory." He is unsure if anyone from home health came to help with his medications but now he states that he is very comfortable with his medication management. He does state that someone is coming out this week from home health nursing to check on his status. He does not operate a motor vehicle. Denies any trouble with ambulation or balance. He is able to complete all ADLs independently. Patient does suffer from ongoing depression. He has regular visits with his psychiatrist and therapist. He feels that his medication is no longer working as well as it did. No new medical issues since last seen.   HISTORY 12/28/14 (WILLIS): Mr. Chiriboga is a 59 year old left-handed white male with a history of epilepsy. The patient been followed through this office in the past, he has not been seen since 2009. Indicates that the [facility] family practice group has been following him for his seizures, and he has been treated with Dilantin and Keppra. He seemed to be relatively well controlled until just recently. The patient again having seizures in the summer 2015. The patient was recently admitted to the hospital in December 2015 with recurring seizures. He was taken off of the Keppra, and switched to Vimpat along with the Dilantin. The patient lives alone. He has a friend that may drive him to get his groceries, etc. The patient indicates that he may miss a dose of medication every once in a while. The patient comes  in the office today for reevaluation for the seizure events. A CT scan of brain was recently done in the hospital, this was unremarkable. There have been no recent EEG evaluations. The patient reports he may get a headache at the onset of the seizure. He is having 2 or 3 seizure events a week. The patient has some occasional nausea with the seizures. He reports no focal numbness or weakness of the face, arms, or legs. He experienced a seizure today in our  office    REVIEW OF SYSTEMS: Full 14 system review of systems performed and notable only for those listed, all others are neg:  Constitutional: neg  Cardiovascular: neg Ear/Nose/Throat: neg  Skin: neg Eyes: Light sensitivity Respiratory: neg Gastroitestinal: Incontinence of bladder, constipation Hematology/Lymphatic: neg  Endocrine: neg Musculoskeletal:neg Allergy/Immunology: neg Neurological: Memory loss, seizure, speech difficulty Psychiatric: Confusion depression and anxiety Sleep : Obstructive sleep apnea   ALLERGIES: Allergies  Allergen Reactions  . Codeine Nausea And Vomiting  . Sulfa Antibiotics Nausea And Vomiting    HOME MEDICATIONS: Outpatient Prescriptions Prior to Visit  Medication Sig Dispense Refill  . amitriptyline (ELAVIL) 150 MG tablet Take 2 tablets (300 mg total) by mouth at bedtime. 60 tablet 3  . aspirin EC 81 MG tablet Take 81 mg by mouth daily.    . bisacodyl (DULCOLAX) 5 MG EC tablet Take 1 tablet (5 mg total) by mouth daily as needed for moderate constipation. 30 tablet 0  . clonazePAM (KLONOPIN) 2 MG tablet Take 2 mg by mouth 4 (four) times daily.    . Lacosamide 100 MG TABS Take 1.5 tablets (150 mg total) by mouth 2 (two) times daily. 90 tablet 3  . metoprolol succinate (TOPROL-XL) 50 MG 24 hr tablet TAKE 1 TABLET (50 MG TOTAL) BY MOUTH AT BEDTIME. TAKE WITH OR IMMEDIATELY FOLLOWING A MEAL. 30 tablet 1  . phenytoin (DILANTIN) 100 MG ER capsule Take 2 capsules (200 mg total) by mouth at bedtime. 60 capsule 3  . polyethylene glycol (MIRALAX / GLYCOLAX) packet Take 17 g by mouth 2 (two) times daily as needed. For constipation. Mix into 8 ounces of fluid & drink 100 each 6  . senna (SENOKOT) 8.6 MG TABS tablet Take 1 tablet (8.6 mg total) by mouth as needed for moderate constipation. 120 each 0  . simvastatin (ZOCOR) 40 MG tablet Take 1 tablet (40 mg total) by mouth at bedtime. 30 tablet 6  . testosterone cypionate (DEPOTESTOTERONE CYPIONATE) 200 MG/ML  injection Inject 2 mLs (400 mg total) into the muscle every 14 (fourteen) days. 10 mL 0  . amitriptyline (ELAVIL) 50 MG tablet Take 50 mg by mouth at bedtime.    Marland Kitchen QUEtiapine (SEROQUEL) 400 MG tablet Take 1 tablet (400 mg total) by mouth 2 (two) times daily. (Patient not taking: Reported on 04/29/2015) 60 tablet 0   No facility-administered medications prior to visit.    PAST MEDICAL HISTORY: Past Medical History  Diagnosis Date  . Seizures   . Hypertension   . GERD (gastroesophageal reflux disease)   . Dyslipidemia   . Schizoaffective disorder   . Depression   . Anxiety   . MRSA carrier   . Constipation   . Memory loss   . Hearing loss     PAST SURGICAL HISTORY: Past Surgical History  Procedure Laterality Date  . Self orchectomy    . Colonoscopy  2010  . Orif shoulder fracture    . Shoulder closed reduction Left 09/16/2013    Procedure: CLOSED REDUCTION  SHOULDER;  Surgeon: Mauri Pole, MD;  Location: [facility];  Service: Orthopedics;  Laterality: Left;  . Shoulder hemi-arthroplasty Left 09/18/2013    Procedure: LEFT SHOULDER HEMI-ARTHROPLASTY;  Surgeon: Augustin Schooling, MD;  Location: [facility];  Service: Orthopedics;  Laterality: Left;  . Carpal tunnel release      FAMILY HISTORY: Family History  Problem Relation Age of Onset  . Stroke Father     Living at 38  . Stroke Mother     Stroke in late 79's. Died in her 30's    SOCIAL HISTORY: History   Social History  . Marital Status: Single    Spouse Name: N/A  . Number of Children: N/A  . Years of Education: N/A   Occupational History  . Not on file.   Social History Main Topics  . Smoking status: Never Smoker   . Smokeless tobacco: Never Used  . Alcohol Use: No  . Drug Use: No  . Sexual Activity: No   Other Topics Concern  . Not on file   Social History Narrative     PHYSICAL EXAM  Filed Vitals:   04/29/15 1124  BP: 106/72  Pulse: 64  Height: 5\' 11"  (1.803 m)  Weight: 200 lb (90.719 kg)   Body  mass index is 27.91 kg/(m^2). Generalized: Well developed, in no acute distress   Neurological examination  Mentation: Alert oriented to time, place, history taking. Follows all commands speech and language fluent. MMSE 22/30- missing items in orientation calculation 1of 3 recall .  Cranial nerve II-XII: Pupils were equal round reactive to light. Extraocular movements were full, visual field were full on confrontational test. Facial sensation and strength were normal. Uvula tongue midline. Head turning and shoulder shrug were normal and symmetric. Motor: The motor testing reveals 5 over 5 strength of all 4 extremities. Good symmetric motor tone is noted throughout.  Sensory: Sensory testing is intact to soft touch on all 4 extremities. No evidence of extinction is noted.  Coordination: Cerebellar testing reveals good finger-nose-finger and heel-to-shin bilaterally.  Gait and station: Gait is normal. Tandem gait is slightly unsteady. Romberg is negative. No drift is seen.  Reflexes: Deep tendon reflexes are symmetric and normal bilaterally.    DIAGNOSTIC DATA (LABS, IMAGING, TESTING) - I reviewed patient records, labs, notes, testing and imaging myself where available.  Lab Results  Component Value Date   WBC 6.6 02/16/2015   HGB 14.0 02/16/2015   HCT 40.5 02/16/2015   MCV 92.5 02/16/2015   PLT 183 02/16/2015      Component Value Date/Time   NA 139 02/16/2015 0550   K 4.4 02/16/2015 0550   CL 107 02/16/2015 0550   CO2 26 02/16/2015 0550   GLUCOSE 93 02/16/2015 0550   BUN 13 02/16/2015 0550   CREATININE 1.21 02/16/2015 0550   CREATININE 0.94 10/13/2014 1503   CALCIUM 9.0 02/16/2015 0550   PROT 7.1 02/15/2015 1537   ALBUMIN 4.5 02/15/2015 1537   AST 34 02/15/2015 1537   ALT 22 02/15/2015 1537   ALKPHOS 112 02/15/2015 1537   BILITOT 1.0 02/15/2015 1537   GFRNONAA 63* 02/16/2015 0550   GFRAA 73* 02/16/2015 0550    Lab Results  Component Value Date   VITAMINB12 432  02/16/2015   Lab Results  Component Value Date   TSH 3.882 02/16/2015      ASSESSMENT AND PLAN  59 y.o. year old male  has a past medical history of Seizures; Hypertension; Schizoaffective disorder; Depression; Anxiety; MRSA carrier;  Memory loss; and altered mental status  here to follow-up. Discussed with Dr. [name] Continue Dilantin at current dose, will refill Continue Vimpat at current dose, will refill Will check EEG MRI of the brain with and without contrast Dennie Bible, [facility], [facility], APRN  [facility] [address], [place], [place] [address] [phone]

## 2015-04-30 LAB — BASIC METABOLIC PANEL
BUN/Creatinine Ratio: 14 (ref 10–22)
BUN: 13 mg/dL (ref 8–27)
CO2: 23 mmol/L (ref 18–29)
Calcium: 9.5 mg/dL (ref 8.6–10.2)
Chloride: 101 mmol/L (ref 97–108)
Creatinine, Ser: 0.94 mg/dL (ref 0.76–1.27)
GFR calc Af Amer: 101 mL/min/{1.73_m2} (ref >59)
GFR calc non Af Amer: 88 mL/min/{1.73_m2} (ref >59)
Glucose: 113 mg/dL — ABNORMAL HIGH (ref 65–99)
Potassium: 5 mmol/L (ref 3.5–5.2)
Sodium: 141 mmol/L (ref 134–144)

## 2015-05-04 ENCOUNTER — Telehealth: Payer: Self-pay | Admitting: Neurology

## 2015-05-04 NOTE — Telephone Encounter (Signed)
I called and spoke to pt and then [name], his girl-friend.  Pt has not had any more seizures since last seen by [place] on 04-29-15.  She is concerned since his seizure presentations are different for the last 4 seziures that he had.  (Had shorter one then a longer one (back to back), head goes to one side, left arm and legs jerk.  Pt is concerned that he cannot remember, cognition is worse.  He has EEG tomorrow, his MRI was 05-10-15 at [facility], but then they rescheduled due to pt cannot go so early.  Now scheduled 05-17-15.  [facility] was their choice.   I told them that they may be able to go to another facility, would forward to MRI and see what can be done.  [name] was appreciative.  (anytime after 1200 is their time request).

## 2015-05-04 NOTE — Telephone Encounter (Signed)
I called and spoke to pt and his girlfriend, [name], and gave results of lab tests.

## 2015-05-04 NOTE — Telephone Encounter (Signed)
Pt called and stated that he has had 4 seizures in the last few days. He would like to speak with the nurse regarding this and the possibility of changing his medication Rx. phenytoin (DILANTIN) 100 MG ER capsule and Rx. VIMPAT (?). He has also been experiencing some short term memory loss. Please call and advise.

## 2015-05-04 NOTE — Progress Notes (Signed)
Quick Note:  I called pt and gave normal results of lab tests to him and to his girlfriend, sharon. We confirmed appts for EEG (05-05-15) and MRI (05-10-15) They will change MRI to another time, (0700) to early. He is concerned about having another seizure. I told him that no change in medications at this time, awaiting other test results. ______

## 2015-05-04 NOTE — Telephone Encounter (Signed)
Patient called stating that someone called him regarding his blood work results. Please call and advise. Patient can be reached @ [phone] and/ or [phone]

## 2015-05-05 ENCOUNTER — Encounter: Payer: Self-pay | Admitting: Neurology

## 2015-05-05 ENCOUNTER — Telehealth: Payer: Self-pay | Admitting: Family Medicine

## 2015-05-05 ENCOUNTER — Telehealth: Payer: Self-pay

## 2015-05-05 ENCOUNTER — Ambulatory Visit (INDEPENDENT_AMBULATORY_CARE_PROVIDER_SITE_OTHER): Payer: Medicaid Other | Admitting: Neurology

## 2015-05-05 DIAGNOSIS — R410 Disorientation, unspecified: Secondary | ICD-10-CM

## 2015-05-05 DIAGNOSIS — R404 Transient alteration of awareness: Secondary | ICD-10-CM | POA: Diagnosis not present

## 2015-05-05 DIAGNOSIS — G40909 Epilepsy, unspecified, not intractable, without status epilepticus: Secondary | ICD-10-CM | POA: Diagnosis not present

## 2015-05-05 NOTE — Telephone Encounter (Signed)
Patient is here for an EEG. The patient's significant other requested to speak to a nurse. She was very emotional and stated that the patient is just not himself anymore. It seems that his memory has become much worse. She states he doesn't seem to be "aware of life." He doesn't take care of himself anymore (in the sense that he does not shave and leaves food all over his face after eating). She stated she just wanted to let Dr. [name] know how bad the patient has become. She seemed like she just needed to talk and get all of this off her chest. I think the pressure of taking care of the patient is taking a toll on her.

## 2015-05-05 NOTE — Telephone Encounter (Signed)
Patient has currently received results from testosterone testing and it was leveled at 32, which he was told it was extremely low. He made an appointment for June the 13th, which was the soonest available appt. The patient would like to know what to do until then regarding this matter. If he does not pick up at his home number, you may try his mobile. Thank you, Fonda Kinder, ASA

## 2015-05-05 NOTE — Progress Notes (Signed)
Please refer to EEG procedure note.

## 2015-05-05 NOTE — Procedures (Signed)
     History: [name] is a 59 year old patient with a history of seizures who has had several recent emergency room visits with altered mental status. The patient was found to have therapy as well as of Dilantin and Vimpat. He is being reevaluated for these episodes.  This is a routine EEG. No skull defects are noted. Medications include amitriptyline, aspirin, Dulcolax, clonazepam, Vimpat, metoprolol, Dilantin, MiraLAX, Seroquel, Zocor, and testosterone.  EEG classification: Dysrhythmia grade 1 bihemispheric  Description of the recording: The background rhythms of this recording consists of a moderately well modulated medium amplitude alpha rhythm of 8 Hz that is reactive to eye opening closure. As the record progresses, photic stimulation is performed, this results in a bilateral and symmetric photic driving response. Hyperventilation is then performed, and this results in a minimal buildup of the background rhythm activities without significant slowing seen. Intermittently during the recording, there appears to be evidence of mild dysrhythmic theta activity and occasional sharp transients emanating from the temporal regions bilaterally and independently, mostly on the left, but occasionally on the right. At no time during the recording does there appear to be evidence of spike or spike-wave discharges. EKG monitor shows no evidence of cardiac rhythm abnormalities with a heart rate of 78.  Impression: This is an abnormal EEG recording secondary to mild dysrhythmic activity emanating from the temporal regions bilaterally suggesting bihemispheric abnormalities, and a lowered seizure threshold. No electrographic seizures were seen.

## 2015-05-05 NOTE — Telephone Encounter (Signed)
I called the patient. The EEG shows bilateral sharp transients. No seizures. MRI Brain is pending. The patient may require medication adjustments if the seizures are becoming out of control.

## 2015-05-05 NOTE — Telephone Encounter (Signed)
I called the patient. The patient has been not acting right for least a month, gradually getting worse, particularly over the last 10 days. Patient will have occasional episodes of staring off. The Vimpat dose in February 2016 was 100 mg twice daily, and some point it was increased to 150 mg twice daily, the wife will try to figure out when this dose change occurred, the Vimpat itself may be the etiology of the altered mental status. MRI of the brain is pending.

## 2015-05-06 ENCOUNTER — Other Ambulatory Visit: Payer: Self-pay | Admitting: Family Medicine

## 2015-05-06 NOTE — Telephone Encounter (Signed)
Attempted to return call to patient. A male answered his home number and said he was not there. She gave me an additional phone number to try for him. He did not answer this number either. Did not leave voicemail.  I did not order a testosterone level on him. I'm not sure who did. It is not visible in our system. I will need to speak with patient before deciding how to proceed with this. I will try to reach him again on Monday.  Leeanne Rio, MD

## 2015-05-09 ENCOUNTER — Ambulatory Visit: Payer: Self-pay | Admitting: Family Medicine

## 2015-05-09 DIAGNOSIS — G40909 Epilepsy, unspecified, not intractable, without status epilepticus: Secondary | ICD-10-CM | POA: Diagnosis not present

## 2015-05-09 NOTE — Progress Notes (Signed)
Quick Note:  I called pt and relayed the results of the EEG. He is to keep on his seizure medications as ordered. He verbalized understanding. He stated he had MRI today. I told him we would call with results. ______

## 2015-05-10 ENCOUNTER — Ambulatory Visit (HOSPITAL_COMMUNITY)
Admission: RE | Admit: 2015-05-10 | Discharge: 2015-05-10 | Disposition: A | Discharge status: Home / Self Care | Payer: Medicaid Other | Source: Ambulatory Visit | Attending: Nurse Practitioner | Admitting: Nurse Practitioner

## 2015-05-10 DIAGNOSIS — R404 Transient alteration of awareness: Secondary | ICD-10-CM

## 2015-05-10 DIAGNOSIS — R410 Disorientation, unspecified: Secondary | ICD-10-CM

## 2015-05-10 DIAGNOSIS — G40909 Epilepsy, unspecified, not intractable, without status epilepticus: Secondary | ICD-10-CM

## 2015-05-10 NOTE — Telephone Encounter (Signed)
Returned call to patient and spoke with him.  He reports labs were drawn by urologist. I do not have a copy of these labs. It appears the urologist wants [place] at the [facility] to continue managing his testosterone replacement, from the notes that are now scanned in (were not scanned in when I looked last week).   Pt will have the labs faxed to [place] from the urologist office. Instructed him to keep his appt with me so we can discuss his testosterone replacement face to face during his upcoming visit. He has enough medical complexities that this should really be managed in person, not over the phone.  Leeanne Rio, MD

## 2015-05-11 ENCOUNTER — Telehealth: Payer: Self-pay | Admitting: Nurse Practitioner

## 2015-05-11 NOTE — Telephone Encounter (Signed)
Report from [facility] Exam date 05/09/15 Normal MRI of the brain with and without contrast Please call the patient

## 2015-05-11 NOTE — Telephone Encounter (Signed)
Called and spoke to significant other [name], once again discussed EEG and MRI of the brain. I think with his history of major depression he needs to follow up with psych. He has an appt with Behavioral health she says.

## 2015-05-11 NOTE — Telephone Encounter (Signed)
I called and relayed to sharon, significant other, that pts MRI brain scan normal.  She would relay to pt.  She is concerned relating to last phone note,  pt memory/ cognitive worsening.   Noted that last ED discharge note 02-17-15 that vimpat increased to 150mg  po bid.   Next f/u 8/18-16 with CM/NP.

## 2015-05-17 ENCOUNTER — Encounter: Payer: Self-pay | Admitting: Nurse Practitioner

## 2015-05-17 ENCOUNTER — Encounter: Payer: Self-pay | Admitting: Family Medicine

## 2015-05-17 ENCOUNTER — Ambulatory Visit (HOSPITAL_COMMUNITY): Admission: RE | Admit: 2015-05-17 | Payer: Medicaid Other | Source: Ambulatory Visit

## 2015-05-17 ENCOUNTER — Ambulatory Visit (INDEPENDENT_AMBULATORY_CARE_PROVIDER_SITE_OTHER): Payer: Medicaid Other | Admitting: Family Medicine

## 2015-05-17 VITALS — BP 136/78 | HR 96 | Temp 98.2°F | Ht 71.0 in | Wt 192.0 lb

## 2015-05-17 DIAGNOSIS — B079 Viral wart, unspecified: Secondary | ICD-10-CM | POA: Diagnosis not present

## 2015-05-17 DIAGNOSIS — L218 Other seborrheic dermatitis: Secondary | ICD-10-CM | POA: Diagnosis not present

## 2015-05-17 DIAGNOSIS — L309 Dermatitis, unspecified: Secondary | ICD-10-CM | POA: Diagnosis not present

## 2015-05-17 DIAGNOSIS — L219 Seborrheic dermatitis, unspecified: Secondary | ICD-10-CM

## 2015-05-17 DIAGNOSIS — B078 Other viral warts: Secondary | ICD-10-CM

## 2015-05-17 LAB — POCT SKIN KOH: Skin KOH, POC: NEGATIVE

## 2015-05-17 MED ORDER — SELENIUM SULFIDE 2.25 % EX SHAM: 1.0000 "application " | MEDICATED_SHAMPOO | Freq: Every day | CUTANEOUS | Status: DC

## 2015-05-17 NOTE — Patient Instructions (Signed)
I have prescribed a shampoo for the the treatment of your scalp. If this is too expensive, you can try Selsun Blue 2.5 (it is similar).  If you notice pain, burning, or worsening rash, please stop using the shampoo and return to clinic. Keep your appoint in 2 weeks with your PCP so you may discuss the testosterone.  Selenium Sulfide shampoo What is this medicine? SELENIUM SULFIDE (se LEE nee um suhl fahyd) shampoo is used to treat dandruff, fungus infections on the scalp and skin and seborrhea of the scalp. This medicine may be used for other purposes; ask your health care provider or pharmacist if you have questions. COMMON BRAND NAME(S): Anti-Dandruff, Dandrex, Selenos, SelRx, Selseb, Selsun, Selsun Blue What should I tell my health care provider before I take this medicine? They need to know if you have any of these conditions: -any open or inflamed areas of the skin -an unusual or allergic reaction to selenium sulfide, other medicines, foods, dyes, or preservatives -pregnant or trying to get pregnant -breast-feeding How should I use this medicine? This medicine is for external use only. Do not take by mouth. Shake well before using. Follow the directions on the prescription label. Wash your hands before and after use. Do not get this medicine in the eyes. If you do, rinse the eyes out with plenty of cool tap water. Avoid use of this medicine in the genital area. Do not use on inflamed or broken skin. Do not use your medicine more often than directed or for a longer period of time than ordered by your doctor or health care professional. To do so may increase the chance of side effects. Talk to your pediatrician regarding the use of this medicine in children. Special care may be needed. Overdosage: If you think you have taken too much of this medicine contact a poison control center or emergency room at once. NOTE: This medicine is only for you. Do not share this medicine with others. What if  I miss a dose? If you miss a dose, use it as soon as you can. If it is almost time for your next dose, use only that dose. Do not use double or extra doses. What may interact with this medicine? Interactions are not expected. Do not use any other skin products on the affected area without telling your doctor or health care professional. This list may not describe all possible interactions. Give your health care provider a list of all the medicines, herbs, non-prescription drugs, or dietary supplements you use. Also tell them if you smoke, drink alcohol, or use illegal drugs. Some items may interact with your medicine. What should I watch for while using this medicine? Tell your doctor or health care professional if your symptoms do not improve after two weeks. If used on blond, bleached, tinted, grey or permed hair, rinse for at least 5 minutes to minimize the chance for hair discoloration. This medicine should not be used within 48 hours of applying hair color or permanent wave solutions. This medicine may damage jewelry. Remove any jewelry before use. What side effects may I notice from receiving this medicine? Side effects that you should report to your doctor or health care professional as soon as possible: -lack of healing of the skin condition -severe redness or irritation of the skin after using this medicine Side effects that usually do not require medical attention (report to your doctor or health care professional if they continue or are bothersome): -minor skin irritation This list  may not describe all possible side effects. Call your doctor for medical advice about side effects. You may report side effects to FDA at 1-800-FDA-1088. Where should I keep my medicine? Keep out of the reach of children. Store at room temperature between 15 and 30 degrees C (59 and 86 degrees F). Keep tightly closed. Throw away any unused medicine after the expiration date. NOTE: This sheet is a summary. It  may not cover all possible information. If you have questions about this medicine, talk to your doctor, pharmacist, or health care provider.  2015, Elsevier/Gold Standard. (2008-08-04 15:28:51)

## 2015-05-17 NOTE — Progress Notes (Signed)
Patient ID: [name], male   DOB: 03-24-1956, 59 y.o.   MRN: [id number]    Subjective: CC: f/u seizures and dry scalp HPI: Patient is a 59 y.o. male with a past medical history of seizures, HTN, androgen insufficiency, schizoaffective disorder, depression, and substance abuse presenting to clinic today for dry scalp and f/u scalp.   Seizures: Patient wanted to let our clinic noted that he has had both MRI and an EEG which are relatively normal. He has not had any seizures in several months on his current regimen. He feels that he is doing well with this. From reviewing the EMR, he is currently being referred to behavioral health as there is some concerns from his girlfriend about medication side effect however neurology feels this is more likely psychiatric in nature.  Dry scalp: The patient notes that his significant other recently noted that he had a dry flaky scalp. He is not sure how long it's been going on, but feels that is present for several months. He denies any change in shampoo soap or lotions. There is no pruritus or inflammation. No abnormal drainage or bleeding. No fevers or chills. He has used several different lotions on his scalp inhopes that it would help, but it does not. No history of sunburn on his scalp. He has not had this issue before. He washes his hair approximately once per week and states that he feels that it is more dry and flaky after he washes his hair.  Also endorses a wart on his R 2nd digit that has been present x 1 year. Not bothersome but he would like it frozen. Has had warts frozen in the past without complication.  He also has an appt with his PCP in 2 weeks to discuss his testosterone levels (was informed that this would be managed by his PCP per a phone call last week), however wonders if this could be addressed today and if he could cancel his upcoming appt. Stressed the importance of attending this appt with his PCP to discuss his T levels and management.     ROS: All other systems reviewed and are negative.  Past Medical History Patient Active Problem List   Diagnosis Date Noted  . Verruca 05/18/2015  . Seborrheic dermatitis of scalp 05/18/2015  . Blood poisoning   . MDD (major depressive disorder), recurrent severe, without psychosis   . OCD (obsessive compulsive disorder)   . Confusion 12/31/2014  . Seizures   . Schizoaffective disorder, depressive type   . Orthostatic hypotension   . Hyperlipidemia   . Falls   . HLD (hyperlipidemia)   . Seizure 11/18/2014  . Schizoaffective disorder, unspecified type   . Essential hypertension   . Overdose of beta-adrenergic antagonist drug 10/22/2014  . Altered mental status   . Overdose   . Nocturnal enuresis 03/23/2014  . Benzodiazepine dependence, continuous 01/27/2014  . Opiate dependence 01/22/2014  . Severe major depression 01/21/2014  . Frozen shoulder syndrome 09/20/2013  . Somnolence 09/20/2013  . Schizoaffective disorder 09/20/2013  . Malaise and fatigue 04/21/2013  . Presbyopia 04/25/2012  . Androgen deficiency 09/07/2011  . Constipation, chronic 09/07/2011  . Other specified disorders of liver 12/30/2008  . Overweight(278.02) 09/08/2008  . HYPERLIPIDEMIA 01/28/2008  . HYPERTENSION 01/28/2008  . Seizure disorder 01/28/2008  . OSTEOPENIA 07/01/2007    Medications- reviewed and updated Current Outpatient Prescriptions  Medication Sig Dispense Refill  . amitriptyline (ELAVIL) 150 MG tablet Take 2 tablets (300 mg total) by mouth at bedtime.  60 tablet 3  . amitriptyline (ELAVIL) 50 MG tablet Take 50 mg by mouth at bedtime.    Marland Kitchen aspirin EC 81 MG tablet Take 81 mg by mouth daily.    . bisacodyl (DULCOLAX) 5 MG EC tablet Take 1 tablet (5 mg total) by mouth daily as needed for moderate constipation. 30 tablet 0  . clonazePAM (KLONOPIN) 2 MG tablet Take 2 mg by mouth 4 (four) times daily.    . [employer] SENNA 8.6 MG tablet TAKE 1 TABLET (8.6 MG TOTAL) BY MOUTH DAILY. 90 tablet 0  .  Lacosamide 100 MG TABS Take 1.5 tablets (150 mg total) by mouth 2 (two) times daily. 90 tablet 5  . metoprolol succinate (TOPROL-XL) 50 MG 24 hr tablet TAKE 1 TABLET (50 MG TOTAL) BY MOUTH AT BEDTIME. TAKE WITH OR IMMEDIATELY FOLLOWING A MEAL. 30 tablet 1  . phenytoin (DILANTIN) 100 MG ER capsule Take 2 capsules (200 mg total) by mouth at bedtime. 60 capsule 6  . polyethylene glycol (MIRALAX / GLYCOLAX) packet Take 17 g by mouth 2 (two) times daily as needed. For constipation. Mix into 8 ounces of fluid & drink 100 each 6  . QUEtiapine (SEROQUEL) 300 MG tablet Take 900 mg by mouth at bedtime.  2  . simvastatin (ZOCOR) 40 MG tablet Take 1 tablet (40 mg total) by mouth at bedtime. 30 tablet 6  . testosterone cypionate (DEPOTESTOTERONE CYPIONATE) 200 MG/ML injection Inject 2 mLs (400 mg total) into the muscle every 14 (fourteen) days. 10 mL 0  . QUEtiapine (SEROQUEL) 400 MG tablet Take 1 tablet (400 mg total) by mouth 2 (two) times daily. (Patient not taking: Reported on 04/29/2015) 60 tablet 0  . Selenium Sulfide 2.25 % SHAM Apply 1 application topically daily. To the scalp 3 to 6 times per week. 180 mL 0   No current facility-administered medications for this visit.    Objective: Office vital signs reviewed. BP 136/78 mmHg  Pulse 96  Temp(Src) 98.2 F (36.8 C) (Oral)  Ht 5\' 11"  (1.803 m)  Wt 192 lb (87.091 kg)  BMI 26.79 kg/m2   Physical Examination:  General: Awake, alert, well- nourished, NAD Head: Dry fine scales over the scalp with a few areas with thickened yellow scales with a few patches of an erythematous base. No drainage or warmth noted. No excoriations. No annular lesions noted  Eyes: Conjunctiva non-injected.  Cardio: RRR, no m/r/g noted.  Pulm: No increased WOB.  CTAB, without wheezes, rhonchi or crackles noted.  Extremities: Small verrucae on the right second digit measuring approximately 9mm in diameter without surrounding erythema, warmth, drainage, or signs of infection.     KOH of scalp scraping negative   Assessment/Plan: Verruca Present for over a year without discomfort/bleeding/super imposed infection. Patient has had cryotherapy in the past and tolerated this well. We discussed the risks and benefits of cryotherapy with liquid nitrogen (pain/blister formation/possibilty of super imposed infection/possibility of requiring repeat treatment vs probable eradication of the wart) and patient consented both verbally and written.  - Cryotherapy of the verruca to the R second digit  - RTC precautions discussed - Pt to return as need for this (has appt with PCP in approx 2 wks)   Seborrheic dermatitis of scalp Patient presenting with what appears to be seborrheic dermatitis. KOH stain negative therefore less likely tinea capitis (and no annular lesions noted on exam). No evidence of superimposed infection currently. This does not appear to be a severe case with prominent inflammation, therefore will  hold off on topical corticosteroids.  - Rx for selenium sulfide 2.5%- discussed that he should massage this in his scalp thoroughly and leave it x 5-10 minutes prior to rinsing and he should wash his hair at least 3x per week.  - Discussed possible reactions and advised him to stop treatment if he has pain/burning/worsening rash and return to clinic. Also advised to avoid eyes. - While there is not as much data, suggested Selsun Blue 2.5 if the Rx is too expensive as an alternative therapy. - Patient aware that this can often take months to improve.      Orders Placed This Encounter  Procedures  . POCT Skin KOH    Meds ordered this encounter  Medications  . Selenium Sulfide 2.25 % SHAM    Sig: Apply 1 application topically daily. To the scalp 3 to 6 times per week.    Dispense:  180 mL    Refill:  [facility]-1, [place]

## 2015-05-17 NOTE — Telephone Encounter (Signed)
This encounter was created in error - please disregard.

## 2015-05-18 DIAGNOSIS — B078 Other viral warts: Secondary | ICD-10-CM | POA: Insufficient documentation

## 2015-05-18 DIAGNOSIS — B079 Viral wart, unspecified: Secondary | ICD-10-CM | POA: Insufficient documentation

## 2015-05-18 DIAGNOSIS — L219 Seborrheic dermatitis, unspecified: Secondary | ICD-10-CM | POA: Insufficient documentation

## 2015-05-18 NOTE — Assessment & Plan Note (Signed)
Patient presenting with what appears to be seborrheic dermatitis. KOH stain negative therefore less likely tinea capitis (and no annular lesions noted on exam). No evidence of superimposed infection currently. This does not appear to be a severe case with prominent inflammation, therefore will hold off on topical corticosteroids.  - Rx for selenium sulfide 2.5%- discussed that he should massage this in his scalp thoroughly and leave it x 5-10 minutes prior to rinsing and he should wash his hair at least 3x per week.  - Discussed possible reactions and advised him to stop treatment if he has pain/burning/worsening rash and return to clinic. Also advised to avoid eyes. - While there is not as much data, suggested Selsun Blue 2.5 if the Rx is too expensive as an alternative therapy. - Patient aware that this can often take months to improve.

## 2015-05-18 NOTE — Assessment & Plan Note (Signed)
Present for over a year without discomfort/bleeding/super imposed infection. Patient has had cryotherapy in the past and tolerated this well. We discussed the risks and benefits of cryotherapy with liquid nitrogen (pain/blister formation/possibilty of super imposed infection/possibility of requiring repeat treatment vs probable eradication of the wart) and patient consented both verbally and written.  - Cryotherapy of the verruca to the R second digit  - RTC precautions discussed - Pt to return as need for this (has appt with PCP in approx 2 wks)

## 2015-05-30 ENCOUNTER — Ambulatory Visit (INDEPENDENT_AMBULATORY_CARE_PROVIDER_SITE_OTHER): Payer: Medicaid Other | Admitting: Family Medicine

## 2015-05-30 ENCOUNTER — Emergency Department (HOSPITAL_COMMUNITY)
Admission: EM | Admit: 2015-05-30 | Discharge: 2015-05-30 | Disposition: A | Discharge status: Home / Self Care | Payer: Medicaid Other | Attending: Emergency Medicine | Admitting: Emergency Medicine

## 2015-05-30 ENCOUNTER — Encounter (HOSPITAL_COMMUNITY): Payer: Self-pay | Admitting: Emergency Medicine

## 2015-05-30 ENCOUNTER — Other Ambulatory Visit: Payer: Self-pay

## 2015-05-30 VITALS — BP 126/74 | HR 81 | Temp 98.1°F | Ht 71.0 in

## 2015-05-30 DIAGNOSIS — F259 Schizoaffective disorder, unspecified: Secondary | ICD-10-CM | POA: Diagnosis not present

## 2015-05-30 DIAGNOSIS — E785 Hyperlipidemia, unspecified: Secondary | ICD-10-CM | POA: Insufficient documentation

## 2015-05-30 DIAGNOSIS — R531 Weakness: Secondary | ICD-10-CM | POA: Diagnosis not present

## 2015-05-30 DIAGNOSIS — Z8719 Personal history of other diseases of the digestive system: Secondary | ICD-10-CM | POA: Diagnosis not present

## 2015-05-30 DIAGNOSIS — Z79899 Other long term (current) drug therapy: Secondary | ICD-10-CM | POA: Insufficient documentation

## 2015-05-30 DIAGNOSIS — G40909 Epilepsy, unspecified, not intractable, without status epilepticus: Secondary | ICD-10-CM | POA: Insufficient documentation

## 2015-05-30 DIAGNOSIS — F329 Major depressive disorder, single episode, unspecified: Secondary | ICD-10-CM | POA: Insufficient documentation

## 2015-05-30 DIAGNOSIS — I10 Essential (primary) hypertension: Secondary | ICD-10-CM | POA: Diagnosis not present

## 2015-05-30 DIAGNOSIS — Z7982 Long term (current) use of aspirin: Secondary | ICD-10-CM | POA: Insufficient documentation

## 2015-05-30 DIAGNOSIS — H9190 Unspecified hearing loss, unspecified ear: Secondary | ICD-10-CM | POA: Diagnosis not present

## 2015-05-30 DIAGNOSIS — F419 Anxiety disorder, unspecified: Secondary | ICD-10-CM | POA: Diagnosis not present

## 2015-05-30 DIAGNOSIS — R569 Unspecified convulsions: Secondary | ICD-10-CM

## 2015-05-30 DIAGNOSIS — E291 Testicular hypofunction: Secondary | ICD-10-CM | POA: Diagnosis not present

## 2015-05-30 LAB — CBC WITH DIFFERENTIAL/PLATELET
Basophils Absolute: 0 10*3/uL (ref 0.0–0.1)
Basophils Relative: 0 % (ref 0–1)
Eosinophils Absolute: 0.1 10*3/uL (ref 0.0–0.7)
Eosinophils Relative: 1 % (ref 0–5)
HCT: 41.3 % (ref 39.0–52.0)
Hemoglobin: 14.3 g/dL (ref 13.0–17.0)
Lymphocytes Relative: 15 % (ref 12–46)
Lymphs Abs: 1.2 10*3/uL (ref 0.7–4.0)
MCH: 32.1 pg (ref 26.0–34.0)
MCHC: 34.6 g/dL (ref 30.0–36.0)
MCV: 92.6 fL (ref 78.0–100.0)
Monocytes Absolute: 0.4 10*3/uL (ref 0.1–1.0)
Monocytes Relative: 5 % (ref 3–12)
Neutro Abs: 6.5 10*3/uL (ref 1.7–7.7)
Neutrophils Relative %: 79 % — ABNORMAL HIGH (ref 43–77)
Platelets: 172 10*3/uL (ref 150–400)
RBC: 4.46 MIL/uL (ref 4.22–5.81)
RDW: 14.3 % (ref 11.5–15.5)
WBC: 8.3 10*3/uL (ref 4.0–10.5)

## 2015-05-30 LAB — BASIC METABOLIC PANEL
Anion gap: 8 (ref 5–15)
BUN: 14 mg/dL (ref 6–20)
CO2: 25 mmol/L (ref 22–32)
Calcium: 8.7 mg/dL — ABNORMAL LOW (ref 8.9–10.3)
Chloride: 104 mmol/L (ref 101–111)
Creatinine, Ser: 0.98 mg/dL (ref 0.61–1.24)
GFR calc Af Amer: >60 mL/min (ref >60)
GFR calc non Af Amer: >60 mL/min (ref >60)
Glucose, Bld: 132 mg/dL — ABNORMAL HIGH (ref 65–99)
Potassium: 3.7 mmol/L (ref 3.5–5.1)
Sodium: 137 mmol/L (ref 135–145)

## 2015-05-30 LAB — PHENYTOIN LEVEL, TOTAL: Phenytoin Lvl: 11.4 ug/mL (ref 10.0–20.0)

## 2015-05-30 LAB — CBG MONITORING, ED: Glucose-Capillary: 134 mg/dL — ABNORMAL HIGH (ref 65–99)

## 2015-05-30 NOTE — Discharge Instructions (Signed)

## 2015-05-30 NOTE — Patient Instructions (Addendum)
For testosterone: Come back 1 week after you have an injection for a visit so we can check your labs and adjust dose Bring your bottles with you so I can see how much you are taking  If you have any further weakness please go to ER immediately  Be well, Dr. Ardelia Mems

## 2015-05-30 NOTE — Progress Notes (Signed)
Patient ID: KHRIZ LIDDY, male   DOB: 08/10/1956, 59 y.o.   MRN: [id number]  HPI:  Pt presents today to discuss hypogonadism. Also needs to discuss acute event which occurred a little while ago in the parking lot.  Twice today, pt has had episode of generalized weakness. The most recent occurred when he was getting out the car in the parking lot. His partner [name] brought him in to the clinic, and almost had to hold up his entire weight. He denies any chest pain, palpitations ,nausea, shortness of breath, or focal weakness. He seemed a little confused during the event. He has generally seemed more confused and out of it over the last several months. Has been following up with neurology for his known seizure disorder. Recently had brain MRI without acute findings. Pt himself has noticed increased issues with word finding over the past several months.  Pt has hx of bilateral self orchiectomy (likely secondary to his profound depression) and does IM testosterone supplementation. I previously referred him to urology so that they might manage his testosterone, but based on their note it appears they would like me to continue managing it. He did get a testosterone level drawn, which was markedly low at 32 on 04/15/15. Pt is unable to reliably tell me the dose he has been taking (seems to be somewhere between 200 and 400mg  IM per dose). He did not bring his medications with him today. He and his partner [name] both corroborate that he does not take his testosterone consistently every 2 weeks, sometimes goes up to a month without a dose. His last dose was about a week ago.  ROS: See HPI.  [place]: hx schizoaffective disorder, HLD, HTN, seizures, bilateral orchiectomy  PHYSICAL EXAM: BP 126/74 mmHg  Pulse 81  Temp(Src) 98.1 F (36.7 C) (Oral)  Ht 5\' 11"  (1.803 m) Gen: NAD HEENT: NCAT, MMM Heart: RRR, no murmurs Lungs: CTAB NWOB Neuro: alert and oriented to person, year, place, situation. Thinks it is May.  States he does not pay attention to the calendar. Speech normal other than some chronic occasional word finding difficulty. Cranial nerves II-XII tested and intact. Normal FNF. Romberg negative. Able to ambulate around clinic with me accompanying him without any signs of unsteadiness or falling. Ext: No appreciable lower extremity edema bilaterally   ASSESSMENT/PLAN:  1. Weakness: pt endorsing two episodes of transient generalized weakness today, one of which happened after getting out of car in our clinic parking lot. Suspect may be heat related or due to polypharmacy (known to be on LOTS of psychoactive medications to control seizures and mood disorder). Neurological exam nonfocal. - Discussed with pt and partner my recommendation that he go to the emergency room for evaluation, at least in order to obtain labwork. By the time he was seen here in our clinic, the lab was closed. After discussion, pt and his partner elected to instead go home, promising they would go to ER if he has any recurrence of these symptoms. They were very reasonable in their thought process and decision making, and I was reassured that pt able to ambulate with me around clinic without any signs of weakness. - EKG performed today and shows no etiology of his weakness.  - Hx of reduced EF noted on last echo, which is several years old. Will plan to repeat outpatient echo to further assess EF and evaluate for valvular dysfunction.   2. Androgen deficiency Pt has not been reliably taking testosterone Q2 weeks as prescribed  and is unable to reliably tell me his current dose. He will continue with the dose he has been using (unclear if 200 or 400 mg by his description) and will focus on dosing it regularly every 2 weeks. He will then follow up with me in about 6-8 weeks for a lab draw, so we can see if dose adjustment is truly warranted. He will bring his testosterone with him to the appointment so we can see how much he's taking. Pt  and partner agreeable to this plan. Partner [name] states she will help him try to remember.   FOLLOW UP: F/u in 6-8 weeks for hypogonadism  [place] J. Ardelia Mems, [place]

## 2015-05-30 NOTE — ED Notes (Signed)
Pt eating vegetable pizza in triage. Pt just left PCP sent over for seizure that happened earlier 30 mins ago. Pt has had 2 seizures today. Pt denies any pain.

## 2015-05-30 NOTE — ED Notes (Signed)
One unsuccessful IV attempt by this Probation officer. Seizure pads in place, suction set up at bedside, pt placed on cardiac monitor. Reports seizures x2 today, denies oral trauma, denies pain, denies incontinence. A&O x4, wife at bedside.

## 2015-05-30 NOTE — ED Provider Notes (Signed)
CSN: [id number]     Arrival date & time 05/30/15  1845 History   First MD Initiated Contact with Patient 05/30/15 2027     Chief Complaint  Patient presents with  . Seizures     (Consider location/radiation/quality/duration/timing/severity/associated sxs/prior Treatment) HPI Comments: Patient with past medical history of seizures, hypertension, schizoaffective disorder presents to the emergency department with chief complaint of seizures. He states that he had 2 seizures today. The seizures were both less than 2 minutes. They were tonic-clonic in nature. He was instructed by his primary care doctor to come to the emergency department for evaluation. He states that he has been seizure-free for several months. States that he has had seizures for the past 30+ years, but is well controlled on Dilantin and Lacosamide. Patient states that he has had some days in the past week where he has not taken his morning medications on time. States that he is taking them in the afternoon hours. He denies any recent illness, fever, chills, nausea, vomiting, diarrhea, dysuria, abdominal pain, or chest pain. There are no aggravating or alleviating factors.  The history is provided by the patient. No language interpreter was used.    Past Medical History  Diagnosis Date  . Seizures   . Hypertension   . GERD (gastroesophageal reflux disease)   . Dyslipidemia   . Schizoaffective disorder   . Depression   . Anxiety   . MRSA carrier   . Constipation   . Memory loss   . Hearing loss    Past Surgical History  Procedure Laterality Date  . Self orchectomy    . Colonoscopy  2010  . Orif shoulder fracture    . Shoulder closed reduction Left 09/16/2013    Procedure: CLOSED REDUCTION SHOULDER;  Surgeon: Mauri Pole, MD;  Location: [facility];  Service: Orthopedics;  Laterality: Left;  . Shoulder hemi-arthroplasty Left 09/18/2013    Procedure: LEFT SHOULDER HEMI-ARTHROPLASTY;  Surgeon: Augustin Schooling, MD;   Location: [facility];  Service: Orthopedics;  Laterality: Left;  . Carpal tunnel release     Family History  Problem Relation Age of Onset  . Stroke Father     Living at 63  . Stroke Mother     Stroke in late 65's. Died in her 62's   History  Substance Use Topics  . Smoking status: Never Smoker   . Smokeless tobacco: Never Used  . Alcohol Use: No    Review of Systems  Constitutional: Negative for fever and chills.  Respiratory: Negative for shortness of breath.   Cardiovascular: Negative for chest pain.  Gastrointestinal: Negative for nausea, vomiting, diarrhea and constipation.  Genitourinary: Negative for dysuria.  Neurological: Positive for seizures.  All other systems reviewed and are negative.     Allergies  Codeine and Sulfa antibiotics  Home Medications   Prior to Admission medications   Medication Sig Start Date End Date Taking? Authorizing Provider  amitriptyline (ELAVIL) 150 MG tablet Take 2 tablets (300 mg total) by mouth at bedtime. 11/29/14   Frazier Richards, MD  amitriptyline (ELAVIL) 50 MG tablet Take 50 mg by mouth at bedtime.    Historical Provider, MD  aspirin EC 81 MG tablet Take 81 mg by mouth daily.    Historical Provider, MD  bisacodyl (DULCOLAX) 5 MG EC tablet Take 1 tablet (5 mg total) by mouth daily as needed for moderate constipation. 01/06/15   Virginia Crews, MD  clonazePAM (KLONOPIN) 2 MG tablet Take 2 mg by mouth  4 (four) times daily.    Historical Provider, MD  [facility] SENNA 8.6 MG tablet TAKE 1 TABLET (8.6 MG TOTAL) BY MOUTH DAILY. 05/06/15   Leeanne Rio, MD  Lacosamide 100 MG TABS Take 1.5 tablets (150 mg total) by mouth 2 (two) times daily. 04/29/15   Dennie Bible, NP  metoprolol succinate (TOPROL-XL) 50 MG 24 hr tablet TAKE 1 TABLET (50 MG TOTAL) BY MOUTH AT BEDTIME. TAKE WITH OR IMMEDIATELY FOLLOWING A MEAL. 04/26/15   Leeanne Rio, MD  phenytoin (DILANTIN) 100 MG ER capsule Take 2 capsules (200 mg total) by mouth at  bedtime. 04/29/15   Dennie Bible, NP  polyethylene glycol [facility] / Floria Raveling) packet Take 17 g by mouth 2 (two) times daily as needed. For constipation. Mix into 8 ounces of fluid & drink 12/31/14   Leeanne Rio, MD  QUEtiapine (SEROQUEL) 300 MG tablet Take 900 mg by mouth at bedtime. 03/28/15   Historical Provider, MD  QUEtiapine (SEROQUEL) 400 MG tablet Take 1 tablet (400 mg total) by mouth 2 (two) times daily. Patient not taking: Reported on 04/29/2015 01/06/15   Virginia Crews, MD  Selenium Sulfide 2.25 % SHAM Apply 1 application topically daily. To the scalp 3 to 6 times per week. 05/17/15   Archie Patten, MD  simvastatin (ZOCOR) 40 MG tablet Take 1 tablet (40 mg total) by mouth at bedtime. 11/29/14   Frazier Richards, MD  testosterone cypionate (DEPOTESTOTERONE CYPIONATE) 200 MG/ML injection Inject 2 mLs (400 mg total) into the muscle every 14 (fourteen) days. 03/08/15   Leeanne Rio, MD   BP 127/81 mmHg  Pulse 83  Temp(Src) 98.1 F (36.7 C) (Oral)  Resp 14  SpO2 96% Physical Exam  Constitutional: He is oriented to person, place, and time. He appears well-developed and well-nourished.  HENT:  Head: Normocephalic and atraumatic.  Eyes: Conjunctivae and EOM are normal. Pupils are equal, round, and reactive to light. Right eye exhibits no discharge. Left eye exhibits no discharge. No scleral icterus.  Neck: Normal range of motion. Neck supple. No JVD present.  Cardiovascular: Normal rate, regular rhythm and normal heart sounds.  Exam reveals no gallop and no friction rub.   No murmur heard. Pulmonary/Chest: Effort normal and breath sounds normal. No respiratory distress. He has no wheezes. He has no rales. He exhibits no tenderness.  Abdominal: Soft. He exhibits no distension and no mass. There is no tenderness. There is no rebound and no guarding.  Musculoskeletal: Normal range of motion. He exhibits no edema or tenderness.  Neurological: He is alert and oriented to  person, place, and time.  Skin: Skin is warm and dry.  Psychiatric: He has a normal mood and affect. His behavior is normal. Judgment and thought content normal.  Nursing note and vitals reviewed.   ED Course  Procedures (including critical care time) Results for orders placed or performed during the hospital encounter of 05/30/15  Dilantin (phenytoin) Level   (if pt is taking this medication)  Result Value Ref Range   Phenytoin Lvl 11.4 10.0 - 20.0 ug/mL  CBC with Differential/Platelet  Result Value Ref Range   WBC 8.3 4.0 - 10.5 K/uL   RBC 4.46 4.22 - 5.81 MIL/uL   Hemoglobin 14.3 13.0 - 17.0 g/dL   HCT 41.3 39.0 - 52.0 %   MCV 92.6 78.0 - 100.0 fL   MCH 32.1 26.0 - 34.0 pg   MCHC 34.6 30.0 - 36.0 g/dL   RDW  14.3 11.5 - 15.5 %   Platelets 172 150 - 400 K/uL   Neutrophils Relative % 79 (H) 43 - 77 %   Neutro Abs 6.5 1.7 - 7.7 K/uL   Lymphocytes Relative 15 12 - 46 %   Lymphs Abs 1.2 0.7 - 4.0 K/uL   Monocytes Relative 5 3 - 12 %   Monocytes Absolute 0.4 0.1 - 1.0 K/uL   Eosinophils Relative 1 0 - 5 %   Eosinophils Absolute 0.1 0.0 - 0.7 K/uL   Basophils Relative 0 0 - 1 %   Basophils Absolute 0.0 0.0 - 0.1 K/uL  Basic metabolic panel  Result Value Ref Range   Sodium 137 135 - 145 mmol/L   Potassium 3.7 3.5 - 5.1 mmol/L   Chloride 104 101 - 111 mmol/L   CO2 25 22 - 32 mmol/L   Glucose, Bld 132 (H) 65 - 99 mg/dL   BUN 14 6 - 20 mg/dL   Creatinine, Ser 0.98 0.61 - 1.24 mg/dL   Calcium 8.7 (L) 8.9 - 10.3 mg/dL   GFR calc non Af Amer >60 >60 mL/min   GFR calc Af Amer >60 >60 mL/min   Anion gap 8 5 - 15  CBG monitoring, ED  Result Value Ref Range   Glucose-Capillary 134 (H) 65 - 99 mg/dL   Mr [facility] Contrast  05/02/2015     [facility] [address], [place], [place] [address] [phone]  NEUROIMAGING REPORT   STUDY DATE: 05/09/2015 PATIENT NAME: BERNARD SLAYDEN DOB: Oct 01, 1956 MRN: [id number]  ORDERING CLINICIAN: Evlyn Courier, NP CLINICAL  HISTORY:  59 year-old patient being evaluated for repetitive seizures COMPARISON FILMS: CT scan of the head 02/15/2015 EXAM: MRI brain with and without TECHNIQUE: MRI of the brain with and without contrast was obtained utilizing 5 mm axial slices with T1, T2, T2 flair, T2 star gradient echo and diffusion weighted views.  T1 sagittal, T2 coronal , FLAIR and postcontrast views in the axial and coronal plane were obtained.  CONTRAST: 9 cc iv Gadavist IMAGING SITE: [name] [place]:  The brain parenchyma shows mild changes of chronic microvascular ischemia and generalized cerebral atrophy. No structural lesion, tumor infarcts are noted. The paranasal sinuses show mild chronic mucosal thickening of mainly the ethmoid sinuses.  No abnormal lesions are seen on diffusion-weighted views to suggest acute ischemia. The cortical sulci, fissures and cisterns are normal in size and appearance. Lateral, third and fourth ventricle are normal in size and appearance. No extra-axial fluid collections are seen. No evidence of mass effect or midline shift.  No abnormal lesions are seen on post contrast views.  On sagittal views the posterior fossa, pituitary gland and corpus callosum are unremarkable. No evidence of intracranial hemorrhage on gradient-echo views. The orbits and their contents, paranasal sinuses and calvarium are unremarkable.  Intracranial flow voids are present.      IMPRESSION: Slightly abnormal MRI scan of the brain showing mild age-related changes of chronic microvascular ischemia and generalized cerebral atrophy. No significant change compared with CT scan brain 02/15/2015    INTERPRETING PHYSICIAN:  PRAMOD SETHI, MD Certified in  Neuroimaging by [employer] and [employer]      Imaging Review No results found.   EKG Interpretation None      MDM   Final diagnoses:  Seizures    Patient with history of seizures. 2 seizures  today. I suspect that the cause  is that the patient has not been consistent with taking his seizure medications on time. There is no history or sign of recent illness that would lower the seizure threshold. We will check a dilantin level, electrolytes, and CBG. Will reassess.  The left level, electrolytes, and CBG are all reassuring. Patient has remained seizure-free during his stay in the emergency department. I suspect that the seizures are secondary to medication noncompliance. Encouraged patient to be more diligent in this regard. Will recommend follow-up with the patient's neurologist, Dr. [name].  Patient discussed with Dr. Johnney Killian, who agrees with the plan.   Montine Circle, PA-C 05/30/15 2255  Charlesetta Shanks, MD 05/31/15 [phone]

## 2015-06-01 ENCOUNTER — Encounter (HOSPITAL_COMMUNITY): Payer: Self-pay | Admitting: *Deleted

## 2015-06-01 ENCOUNTER — Other Ambulatory Visit: Payer: Self-pay

## 2015-06-01 ENCOUNTER — Inpatient Hospital Stay (HOSPITAL_COMMUNITY)
Admission: EM | Admit: 2015-06-01 | Discharge: 2015-06-03 | DRG: 101 | Disposition: A | Discharge status: Home / Self Care | Payer: Medicaid Other | Attending: Family Medicine | Admitting: Family Medicine

## 2015-06-01 ENCOUNTER — Emergency Department (HOSPITAL_COMMUNITY): Payer: Medicaid Other

## 2015-06-01 DIAGNOSIS — Z885 Allergy status to narcotic agent status: Secondary | ICD-10-CM | POA: Diagnosis not present

## 2015-06-01 DIAGNOSIS — K5900 Constipation, unspecified: Secondary | ICD-10-CM | POA: Diagnosis present

## 2015-06-01 DIAGNOSIS — H524 Presbyopia: Secondary | ICD-10-CM | POA: Diagnosis present

## 2015-06-01 DIAGNOSIS — Z96612 Presence of left artificial shoulder joint: Secondary | ICD-10-CM | POA: Diagnosis present

## 2015-06-01 DIAGNOSIS — K219 Gastro-esophageal reflux disease without esophagitis: Secondary | ICD-10-CM | POA: Diagnosis present

## 2015-06-01 DIAGNOSIS — I509 Heart failure, unspecified: Secondary | ICD-10-CM | POA: Diagnosis not present

## 2015-06-01 DIAGNOSIS — Z882 Allergy status to sulfonamides status: Secondary | ICD-10-CM

## 2015-06-01 DIAGNOSIS — H9190 Unspecified hearing loss, unspecified ear: Secondary | ICD-10-CM | POA: Diagnosis present

## 2015-06-01 DIAGNOSIS — R569 Unspecified convulsions: Secondary | ICD-10-CM | POA: Diagnosis present

## 2015-06-01 DIAGNOSIS — F42 Obsessive-compulsive disorder: Secondary | ICD-10-CM | POA: Diagnosis present

## 2015-06-01 DIAGNOSIS — Z22322 Carrier or suspected carrier of Methicillin resistant Staphylococcus aureus: Secondary | ICD-10-CM

## 2015-06-01 DIAGNOSIS — I10 Essential (primary) hypertension: Secondary | ICD-10-CM | POA: Diagnosis present

## 2015-06-01 DIAGNOSIS — G40802 Other epilepsy, not intractable, without status epilepticus: Principal | ICD-10-CM | POA: Diagnosis present

## 2015-06-01 DIAGNOSIS — I429 Cardiomyopathy, unspecified: Secondary | ICD-10-CM | POA: Diagnosis present

## 2015-06-01 DIAGNOSIS — E785 Hyperlipidemia, unspecified: Secondary | ICD-10-CM | POA: Diagnosis present

## 2015-06-01 DIAGNOSIS — M8580 Other specified disorders of bone density and structure, unspecified site: Secondary | ICD-10-CM | POA: Diagnosis present

## 2015-06-01 DIAGNOSIS — F332 Major depressive disorder, recurrent severe without psychotic features: Secondary | ICD-10-CM | POA: Diagnosis present

## 2015-06-01 DIAGNOSIS — F251 Schizoaffective disorder, depressive type: Secondary | ICD-10-CM | POA: Diagnosis present

## 2015-06-01 DIAGNOSIS — F418 Other specified anxiety disorders: Secondary | ICD-10-CM | POA: Diagnosis present

## 2015-06-01 DIAGNOSIS — R109 Unspecified abdominal pain: Secondary | ICD-10-CM

## 2015-06-01 LAB — URINALYSIS, ROUTINE W REFLEX MICROSCOPIC
Bilirubin Urine: NEGATIVE
Glucose, UA: NEGATIVE mg/dL
Hgb urine dipstick: NEGATIVE
Ketones, ur: NEGATIVE mg/dL
Leukocytes, UA: NEGATIVE
Nitrite: NEGATIVE
Protein, ur: NEGATIVE mg/dL
Specific Gravity, Urine: 1.013 (ref 1.005–1.030)
Urobilinogen, UA: 0.2 mg/dL (ref 0.0–1.0)
pH: 6.5 (ref 5.0–8.0)

## 2015-06-01 LAB — CBC WITH DIFFERENTIAL/PLATELET
Basophils Absolute: 0 10*3/uL (ref 0.0–0.1)
Basophils Relative: 0 % (ref 0–1)
Eosinophils Absolute: 0 10*3/uL (ref 0.0–0.7)
Eosinophils Relative: 0 % (ref 0–5)
HCT: 40.4 % (ref 39.0–52.0)
Hemoglobin: 14 g/dL (ref 13.0–17.0)
Lymphocytes Relative: 13 % (ref 12–46)
Lymphs Abs: 1.3 10*3/uL (ref 0.7–4.0)
MCH: 31.7 pg (ref 26.0–34.0)
MCHC: 34.7 g/dL (ref 30.0–36.0)
MCV: 91.4 fL (ref 78.0–100.0)
Monocytes Absolute: 0.6 10*3/uL (ref 0.1–1.0)
Monocytes Relative: 6 % (ref 3–12)
Neutro Abs: 7.7 10*3/uL (ref 1.7–7.7)
Neutrophils Relative %: 81 % — ABNORMAL HIGH (ref 43–77)
Platelets: 158 10*3/uL (ref 150–400)
RBC: 4.42 MIL/uL (ref 4.22–5.81)
RDW: 14.2 % (ref 11.5–15.5)
WBC: 9.6 10*3/uL (ref 4.0–10.5)

## 2015-06-01 LAB — I-STAT TROPONIN, ED: Troponin i, poc: 0 ng/mL (ref 0.00–0.08)

## 2015-06-01 LAB — COMPREHENSIVE METABOLIC PANEL
ALT: 21 U/L (ref 17–63)
AST: 22 U/L (ref 15–41)
Albumin: 4.3 g/dL (ref 3.5–5.0)
Alkaline Phosphatase: 95 U/L (ref 38–126)
Anion gap: 10 (ref 5–15)
BUN: 12 mg/dL (ref 6–20)
CO2: 24 mmol/L (ref 22–32)
Calcium: 8.9 mg/dL (ref 8.9–10.3)
Chloride: 104 mmol/L (ref 101–111)
Creatinine, Ser: 1.07 mg/dL (ref 0.61–1.24)
GFR calc Af Amer: >60 mL/min (ref >60)
GFR calc non Af Amer: >60 mL/min (ref >60)
Glucose, Bld: 128 mg/dL — ABNORMAL HIGH (ref 65–99)
Potassium: 3.9 mmol/L (ref 3.5–5.1)
Sodium: 138 mmol/L (ref 135–145)
Total Bilirubin: 0.5 mg/dL (ref 0.3–1.2)
Total Protein: 7.3 g/dL (ref 6.5–8.1)

## 2015-06-01 LAB — PHENYTOIN LEVEL, TOTAL: Phenytoin Lvl: 12.6 ug/mL (ref 10.0–20.0)

## 2015-06-01 MED ORDER — SODIUM CHLORIDE 0.9 % IV BOLUS (SEPSIS)
1000.0000 mL | Freq: Once | INTRAVENOUS | Status: AC
  Administered 2015-06-01: 1000 mL via INTRAVENOUS

## 2015-06-01 MED ORDER — LORAZEPAM 2 MG/ML IJ SOLN: 1.0000 mg | Freq: Once | INTRAMUSCULAR | Status: AC

## 2015-06-01 MED ORDER — LORAZEPAM 2 MG/ML IJ SOLN
INTRAMUSCULAR | Status: AC
  Administered 2015-06-01: 1 mg
  Filled 2015-06-01: qty 1

## 2015-06-01 NOTE — ED Notes (Signed)
Bed: WA07 Expected date:  Expected time:  Means of arrival:  Comments: Ems- seizure

## 2015-06-01 NOTE — ED Provider Notes (Signed)
CSN: [id number]     Arrival date & time 06/01/15  1602 History   First MD Initiated Contact with Patient 06/01/15 1639     Chief Complaint  Patient presents with  . Seizures     (Consider location/radiation/quality/duration/timing/severity/associated sxs/prior Treatment) HPI   59 year old male with past medical history significant for seizure currently on Dilantin and Lacosamide, schizoaffective disorder, hypertension, depression, anxietypresenting to the ED for evaluation of seizure. Patient has 2 episodes of seizure yesterday lasting less than 2 minutes each. He was tonic-clonic in nature.this morning he stated he may have passed out while walking back from his bathroom to his bed. States that he felt weak and had to sit down the ground but did not injured himself. Denies having postictal symptoms. A few hours later, while in the living room, patient had a witnessed seizure episode. Per friend, patient fell forward, striking body against a week a chair and subsequently striking face against ground, broken his glasses. He had a tonic-clonic seizure lasting less than a minute follows by a postictal state. At this time he is back to his normal mental status. Patient denies having any significant headache, facial pain, vision changes, neck pain, chest pain, shortness of breath, abdominal pain, back pain, focal numbness weakness, or rash. He did take his seizure medication this morning but admits that he has not been fully compliant with his medication. Furthermore, since April patient has had recurrent seizure activities at least twice weekly. Neurologist is Dr. [name] who recently changed his Vimpat dose last April. Otherwise no recent sickness, or any other precipitating condition causing his seizure. He denies alcohol abuse or drugs abuse. denies tongue injury, or bowel bladder incontinence.   Past Medical History  Diagnosis Date  . Seizures   . Hypertension   . GERD (gastroesophageal reflux  disease)   . Dyslipidemia   . Schizoaffective disorder   . Depression   . Anxiety   . MRSA carrier   . Constipation   . Memory loss   . Hearing loss    Past Surgical History  Procedure Laterality Date  . Self orchectomy    . Colonoscopy  2010  . Orif shoulder fracture    . Shoulder closed reduction Left 09/16/2013    Procedure: CLOSED REDUCTION SHOULDER;  Surgeon: Mauri Pole, MD;  Location: [facility];  Service: Orthopedics;  Laterality: Left;  . Shoulder hemi-arthroplasty Left 09/18/2013    Procedure: LEFT SHOULDER HEMI-ARTHROPLASTY;  Surgeon: Augustin Schooling, MD;  Location: [facility];  Service: Orthopedics;  Laterality: Left;  . Carpal tunnel release     Family History  Problem Relation Age of Onset  . Stroke Father     Living at 67  . Stroke Mother     Stroke in late 5's. Died in her 62's   History  Substance Use Topics  . Smoking status: Never Smoker   . Smokeless tobacco: Never Used  . Alcohol Use: No    Review of Systems  All other systems reviewed and are negative.     Allergies  Codeine and Sulfa antibiotics  Home Medications   Prior to Admission medications   Medication Sig Start Date End Date Taking? Authorizing Provider  amitriptyline (ELAVIL) 150 MG tablet Take 2 tablets (300 mg total) by mouth at bedtime. 11/29/14  Yes Frazier Richards, MD  amitriptyline (ELAVIL) 50 MG tablet Take 50 mg by mouth at bedtime.   Yes Historical Provider, MD  aspirin EC 81 MG tablet Take 81 mg by  mouth daily.   Yes Historical Provider, MD  clonazePAM (KLONOPIN) 2 MG tablet Take 2 mg by mouth 4 (four) times daily.   Yes Historical Provider, MD  [facility] SENNA 8.6 MG tablet TAKE 1 TABLET (8.6 MG TOTAL) BY MOUTH DAILY. 05/06/15  Yes Leeanne Rio, MD  Lacosamide 100 MG TABS Take 1.5 tablets (150 mg total) by mouth 2 (two) times daily. 04/29/15  Yes Dennie Bible, NP  metoprolol succinate (TOPROL-XL) 50 MG 24 hr tablet TAKE 1 TABLET (50 MG TOTAL) BY MOUTH AT BEDTIME. TAKE WITH  OR IMMEDIATELY FOLLOWING A MEAL. 04/26/15  Yes Leeanne Rio, MD  phenytoin (DILANTIN) 100 MG ER capsule Take 2 capsules (200 mg total) by mouth at bedtime. 04/29/15  Yes Dennie Bible, NP  polyethylene glycol [facility] / GLYCOLAX) packet Take 17 g by mouth 2 (two) times daily as needed. For constipation. Mix into 8 ounces of fluid & drink 12/31/14  Yes Leeanne Rio, MD  QUEtiapine (SEROQUEL) 300 MG tablet Take 900 mg by mouth at bedtime. 03/28/15  Yes Historical Provider, MD  Selenium Sulfide 2.25 % SHAM Apply 1 application topically daily. To the scalp 3 to 6 times per week. 05/17/15  Yes Archie Patten, MD  simvastatin (ZOCOR) 40 MG tablet Take 1 tablet (40 mg total) by mouth at bedtime. 11/29/14  Yes Frazier Richards, MD  testosterone cypionate (DEPOTESTOTERONE CYPIONATE) 200 MG/ML injection Inject 2 mLs (400 mg total) into the muscle every 14 (fourteen) days. 03/08/15  Yes Leeanne Rio, MD  QUEtiapine (SEROQUEL) 400 MG tablet Take 1 tablet (400 mg total) by mouth 2 (two) times daily. Patient not taking: Reported on 04/29/2015 01/06/15   Virginia Crews, MD   BP 123/77 mmHg  Pulse 88  Temp(Src) 97.4 F (36.3 C) (Oral)  Resp 24  SpO2 94% Physical Exam  Constitutional: He is oriented to person, place, and time. He appears well-developed and well-nourished. No distress.  HENT:  Head: Atraumatic.  Small abrasion noted through the bridge of nose with minimal tenderness, no evidence of nosebleed.  No tongue injury.  Eyes: Conjunctivae are normal.  Neck: Normal range of motion. Neck supple.  Cardiovascular: Normal rate and regular rhythm.   Pulmonary/Chest: Effort normal and breath sounds normal.  Abdominal: Soft. He exhibits no distension. There is no tenderness.  Musculoskeletal: He exhibits no edema or tenderness.  Neurological: He is alert and oriented to person, place, and time. He has normal strength. No cranial nerve deficit or sensory deficit. GCS eye subscore  is 4. GCS verbal subscore is 5. GCS motor subscore is 6.  Skin: No rash noted.  Psychiatric: He has a normal mood and affect.  Nursing note and vitals reviewed.   ED Course  Procedures (including critical care time)  7:13 PM Patient with history of seizure currently on Vimpat 150 mg twice a day and Dilantin 200 mg daily at bedtime, here with recurrent seizures. He was seen and evaluated in the ED yesterday for this and was discharged here again due to another seizure episode which was witnessed. Labs are reassuring,head CT scan is unremarkable. However given his recurrent symptoms, plan to consult neurology for further recommendation. Patient is now back to his normal baseline.  9:14 PM Appreciate consultation from neuro hospitalist, Dr. Janann Colonel who recommend patient to increase his Dilantin from 200 mg daily at bedtime to 300 mg daily at bedtime. Vimpat dose should be the same. He would need to contact his neurologist for close  follow-up tomorrow. Otherwise he is stable for discharge. Patient voiced understanding and agrees with plan. Return precautions discussed.  9:29 PM Prior to discharge, patient seized. Episode lasting less than a minute. He is now at a post ictal state. He will need to be admitted for further management.  10:59 PM Appreciated consultation for [facility] resident who agrees to admit pt under attending Dr. Mingo Amber.  Will place temporary order and transfer pt to [facility].    Labs Review Labs Reviewed  COMPREHENSIVE METABOLIC PANEL - Abnormal; Notable for the following:    Glucose, Bld 128 (*)    All other components within normal limits  URINALYSIS, ROUTINE W REFLEX MICROSCOPIC (NOT AT [facility]) - Abnormal; Notable for the following:    APPearance CLOUDY (*)    All other components within normal limits  CBC WITH DIFFERENTIAL/PLATELET - Abnormal; Notable for the following:    Neutrophils Relative % 81 (*)    All other components within normal limits  CBC WITH  DIFFERENTIAL/PLATELET  PHENYTOIN LEVEL, TOTAL  I-STAT TROPOININ, ED    Imaging Review Ct Head Wo Contrast  06/01/2015   CLINICAL DATA:  60 year old male status post fall after seizure  EXAM: CT HEAD WITHOUT CONTRAST  TECHNIQUE: Contiguous axial images were obtained from the base of the skull through the vertex without intravenous contrast.  COMPARISON:  Prior head CT 02/15/2015  FINDINGS: Negative for acute intracranial hemorrhage, acute infarction, mass, mass effect, hydrocephalus or midline shift. Gray-white differentiation is preserved throughout. No acute soft tissue or calvarial abnormality. The globes and orbits are symmetric and unremarkable. Normal aeration of the mastoid air cells and visualized paranasal sinuses.  IMPRESSION: Negative head CT.   Electronically Signed   By: Jacqulynn Cadet M.D.   On: 06/01/2015 17:51     EKG Interpretation None      MDM   Final diagnoses:  Seizure    BP 148/91 mmHg  Pulse 87  Temp(Src) 97.4 F (36.3 C) (Oral)  Resp 23  SpO2 93%  I have reviewed nursing notes and vital signs. I personally viewed the imaging tests through PACS system and agrees with radiologist's intepretation I reviewed available ER/hospitalization records through the EMR     Domenic Moras, PA-C 06/01/15 2115  Domenic Moras, PA-C 06/01/15 [place], MD 06/02/15 1347

## 2015-06-01 NOTE — ED Notes (Signed)
ED M.D. Contacted regarding patient seizure activity. M.D. Ordered Ativan 1 mg IV

## 2015-06-01 NOTE — ED Notes (Signed)
EDP at [facility] updating pt about admission plan. Pending hospitalist.

## 2015-06-01 NOTE — ED Notes (Signed)
Pt at this time s/p sz activity, alert, NAD, calm, interactive, resps e/u, speech clear, no dyspnea noted, (denies pain), answering questions, follows commands, some confusion, acknowledges new information and update, pending dispo/ re-eval, VSS, no new injury. sz precautions remain in place.

## 2015-06-01 NOTE — Assessment & Plan Note (Addendum)
Pt has not been reliably taking testosterone Q2 weeks as prescribed and is unable to reliably tell me his current dose. He will continue with the dose he has been using (unclear if 200 or 400 mg by his description) and will focus on dosing it regularly every 2 weeks. He will then follow up with me in about 6-8 weeks for a lab draw, so we can see if dose adjustment is truly warranted. He will bring his testosterone with him to the appointment so we can see how much he's taking. Pt and partner agreeable to this plan. Partner [name] states she will help him try to remember.

## 2015-06-01 NOTE — ED Notes (Signed)
No changes, alert, NAD, calm, speech clear, (denies: pain, injury, sob, nausea, dizziness, visual changes (not wearing his glasses), no sz activity noted. No dyspnea noted. VSS. sz pads remain in place.

## 2015-06-01 NOTE — ED Notes (Signed)
Pt alert, NAD, calm, interactive, no dyspnea noted, speech clear, family at [facility], VSS.

## 2015-06-01 NOTE — ED Notes (Signed)
I attempted to collect labs twice and was unsuccessful.  I made nurse aware.

## 2015-06-01 NOTE — ED Notes (Signed)
Pt has a history of seizures, pt seen in ED yesterday for same diagnosis. Pts girlfriend reports patient was sitting at the table when he began to seize, with no LOCShe reports patient is at baseline and has hx of "memory problems". He takes several psychiatric medications. Small abrasion right forearm. Pt is now a&ox 4

## 2015-06-02 ENCOUNTER — Encounter (HOSPITAL_COMMUNITY): Payer: Self-pay

## 2015-06-02 DIAGNOSIS — R569 Unspecified convulsions: Secondary | ICD-10-CM

## 2015-06-02 LAB — CBC
HCT: 39.8 % (ref 39.0–52.0)
Hemoglobin: 13.8 g/dL (ref 13.0–17.0)
MCH: 31.7 pg (ref 26.0–34.0)
MCHC: 34.7 g/dL (ref 30.0–36.0)
MCV: 91.5 fL (ref 78.0–100.0)
Platelets: 152 10*3/uL (ref 150–400)
RBC: 4.35 MIL/uL (ref 4.22–5.81)
RDW: 14.3 % (ref 11.5–15.5)
WBC: 6.1 10*3/uL (ref 4.0–10.5)

## 2015-06-02 LAB — BASIC METABOLIC PANEL
Anion gap: 8 (ref 5–15)
BUN: 9 mg/dL (ref 6–20)
CO2: 26 mmol/L (ref 22–32)
Calcium: 8.7 mg/dL — ABNORMAL LOW (ref 8.9–10.3)
Chloride: 107 mmol/L (ref 101–111)
Creatinine, Ser: 0.89 mg/dL (ref 0.61–1.24)
GFR calc Af Amer: >60 mL/min (ref >60)
GFR calc non Af Amer: >60 mL/min (ref >60)
Glucose, Bld: 115 mg/dL — ABNORMAL HIGH (ref 65–99)
Potassium: 3.5 mmol/L (ref 3.5–5.1)
Sodium: 141 mmol/L (ref 135–145)

## 2015-06-02 LAB — MRSA PCR SCREENING: MRSA by PCR: NEGATIVE

## 2015-06-02 MED ORDER — LACOSAMIDE 50 MG PO TABS
200.0000 mg | ORAL_TABLET | Freq: Two times a day (BID) | ORAL | Status: DC
  Administered 2015-06-02 – 2015-06-03 (×3): 200 mg via ORAL
  Filled 2015-06-02 (×3): qty 4

## 2015-06-02 MED ORDER — METOPROLOL SUCCINATE ER 50 MG PO TB24
50.0000 mg | ORAL_TABLET | Freq: Every day | ORAL | Status: DC
  Administered 2015-06-02 – 2015-06-03 (×2): 50 mg via ORAL
  Filled 2015-06-02 (×2): qty 1

## 2015-06-02 MED ORDER — SENNA 8.6 MG PO TABS
1.0000 | ORAL_TABLET | Freq: Every day | ORAL | Status: DC
  Administered 2015-06-02: 8.6 mg via ORAL
  Filled 2015-06-02 (×2): qty 1

## 2015-06-02 MED ORDER — SENNOSIDES-DOCUSATE SODIUM 8.6-50 MG PO TABS
1.0000 | ORAL_TABLET | Freq: Every day | ORAL | Status: DC
  Administered 2015-06-02: 1 via ORAL
  Filled 2015-06-02: qty 1

## 2015-06-02 MED ORDER — PHENYTOIN SODIUM EXTENDED 100 MG PO CAPS
300.0000 mg | ORAL_CAPSULE | Freq: Every day | ORAL | Status: DC
  Administered 2015-06-02 (×2): 300 mg via ORAL
  Filled 2015-06-02 (×3): qty 3

## 2015-06-02 MED ORDER — ACETAMINOPHEN 650 MG RE SUPP: 650.0000 mg | Freq: Four times a day (QID) | RECTAL | Status: DC | PRN

## 2015-06-02 MED ORDER — ACETAMINOPHEN 325 MG PO TABS
650.0000 mg | ORAL_TABLET | Freq: Four times a day (QID) | ORAL | Status: DC | PRN
  Administered 2015-06-02: 650 mg via ORAL
  Filled 2015-06-02: qty 2

## 2015-06-02 MED ORDER — POLYETHYLENE GLYCOL 3350 17 G PO PACK
17.0000 g | PACK | Freq: Two times a day (BID) | ORAL | Status: DC | PRN
  Administered 2015-06-02: 17 g via ORAL
  Filled 2015-06-02 (×3): qty 1

## 2015-06-02 MED ORDER — AMITRIPTYLINE HCL 50 MG PO TABS
350.0000 mg | ORAL_TABLET | Freq: Every day | ORAL | Status: DC
  Administered 2015-06-02 (×2): 350 mg via ORAL
  Filled 2015-06-02 (×3): qty 1

## 2015-06-02 MED ORDER — ENOXAPARIN SODIUM 40 MG/0.4ML ~~LOC~~ SOLN
40.0000 mg | Freq: Every day | SUBCUTANEOUS | Status: DC
  Administered 2015-06-02 – 2015-06-03 (×2): 40 mg via SUBCUTANEOUS
  Filled 2015-06-02 (×2): qty 0.4

## 2015-06-02 MED ORDER — CLONAZEPAM 1 MG PO TABS
2.0000 mg | ORAL_TABLET | Freq: Three times a day (TID) | ORAL | Status: DC
  Administered 2015-06-02 – 2015-06-03 (×6): 2 mg via ORAL
  Filled 2015-06-02 (×6): qty 2

## 2015-06-02 MED ORDER — QUETIAPINE FUMARATE 300 MG PO TABS
900.0000 mg | ORAL_TABLET | Freq: Every day | ORAL | Status: DC
  Administered 2015-06-02: 900 mg via ORAL
  Filled 2015-06-02 (×3): qty 3

## 2015-06-02 MED ORDER — ASPIRIN EC 81 MG PO TBEC
81.0000 mg | DELAYED_RELEASE_TABLET | Freq: Every day | ORAL | Status: DC
  Administered 2015-06-02 – 2015-06-03 (×2): 81 mg via ORAL
  Filled 2015-06-02 (×2): qty 1

## 2015-06-02 MED ORDER — SIMVASTATIN 40 MG PO TABS
40.0000 mg | ORAL_TABLET | Freq: Every day | ORAL | Status: DC
  Administered 2015-06-02 (×2): 40 mg via ORAL
  Filled 2015-06-02 (×3): qty 1

## 2015-06-02 MED ORDER — LACOSAMIDE 50 MG PO TABS
150.0000 mg | ORAL_TABLET | Freq: Two times a day (BID) | ORAL | Status: DC
  Administered 2015-06-02: 150 mg via ORAL
  Filled 2015-06-02: qty 3

## 2015-06-02 NOTE — Progress Notes (Signed)
Utilization review completed.  

## 2015-06-02 NOTE — Progress Notes (Signed)
Received patient to room 5W24 from [facility] via stretcher, lying in bed with SR up, call bell in reach, O2 in use, noted IV to LAC, site clear. Patient denies pain or problems at this time, oriented to room, call bell, bed controls, ID bracelet applied after verifying name, birthdate, reviewed patient guide book with patient with verbal understanding. MD at bedside, awaiting orders, will monitor.

## 2015-06-02 NOTE — Care Management Note (Signed)
Case Management Note  Patient Details  Name: ARGIE APPLEGATE MRN: [id number] Date of Birth: Apr 23, 1956  Subjective/Objective:  Patient lives with girlfriend, he has transportation to go home with at [employer],.  He also gets medications with Medicaid.  NCM will await pt eval.                   Action/Plan:   Expected Discharge Date:                  Expected Discharge Plan:  [place]  In-House Referral:     Discharge planning Services  CM Consult  Post Acute Care Choice:    Choice offered to:     DME Arranged:    DME Agency:     HH Arranged:    [place] Agency:     Status of Service:  In process, will continue to follow  Medicare Important Message Given:  No Date Medicare IM Given:    Medicare IM give by:    Date Additional Medicare IM Given:    Additional Medicare Important Message give by:     If discussed at [place] of Stay Meetings, dates discussed:    Additional Comments:  Zenon Mayo, RN 06/02/2015, 2:13 PM

## 2015-06-02 NOTE — H&P (Signed)
[facility] Admission History and Physical Service Pager: [phone]  Patient name: PHUC KLUTTZ Medical record number: [id number] Date of birth: 09/14/1956 Age: 59 y.o. Gender: male  Primary Care Provider: Chrisandra Netters, MD Consultants: neuro (will call in AM) Code Status: full code, per discussion on admission)  Chief Complaint: seizure, AMS  Assessment and Plan: MCCABE GLORIA is a 59 y.o. male presenting with 4 breakthrough seizures in last 48 hours . PMH is significant for seizure disorder, severe schizzoaffective disorder, depression, anxiety, HTN, HLD.  AMS, breakthrough seizures: experiencing 2 seizures per week since April, 4 breakthrough seizures in last 48 hrs.  ED was prepared to send home before he had another witnessed seizure in ED.  Neurology rec increased Dilantin dose when c/s in ED. Reports compliance with medication, phenytoin level 12.6. - Admit to obs, FPTS, attending [facility] - C/s Neuro in AM - Continue home Vimpat and klonopin - Dilantin dose increase to 300mg  qhs (from 200mg  qhs) per neurology recs - seizure precautions - monitor mental status for improvement after resolution of post-ictal state  Schizoaffective/depression/anxiety: Mood stable on admission - continue home seroquel, elavil, rozerem  HTN, HLD: BP stable on admission - Continue home Zocor, ASA, metoprolol - Continue to monitor  FEN/GI: heart healthy diet, SLIV Prophylaxis: Lovenox  Disposition: admit to obs, FPTS, attending Mingo Amber  History of Present Illness: MOUSTAFA MOSSA is a 59 y.o. male presenting with breakthrough seizures.  Patient reports that he does not remember seizures and has losses of memories for the last several months as he has been having seizures.  He feels like he is still a little confused after his most recent seizure. Denies fevers, cough, congestion, N/V/D, urinary symptoms, pain.  Per EDP, patient was seen 6/14 for 2 tonic clonic seizures  and discharged home.  He has been having about 2 seizures per week since April.  He returned to ED for another seizure today, neuro was consulted and rec increased Dilantin dose and d/c home with close OP neuro f/u.  As EDP was going to d/c patient, he had another seizure.  Review Of Systems: Per HPI with the following additions: as above, no additions Otherwise 12 point review of systems was performed and was unremarkable.  Patient Active Problem List   Diagnosis Date Noted  . Verruca 05/18/2015  . Seborrheic dermatitis of scalp 05/18/2015  . Blood poisoning   . MDD (major depressive disorder), recurrent severe, without psychosis   . OCD (obsessive compulsive disorder)   . Confusion 12/31/2014  . Seizures   . Schizoaffective disorder, depressive type   . Orthostatic hypotension   . Hyperlipidemia   . Falls   . HLD (hyperlipidemia)   . Seizure 11/18/2014  . Schizoaffective disorder, unspecified type   . Essential hypertension   . Overdose of beta-adrenergic antagonist drug 10/22/2014  . Altered mental status   . Overdose   . Nocturnal enuresis 03/23/2014  . Benzodiazepine dependence, continuous 01/27/2014  . Opiate dependence 01/22/2014  . Severe major depression 01/21/2014  . Frozen shoulder syndrome 09/20/2013  . Somnolence 09/20/2013  . Schizoaffective disorder 09/20/2013  . Malaise and fatigue 04/21/2013  . Presbyopia 04/25/2012  . Androgen deficiency 09/07/2011  . Constipation, chronic 09/07/2011  . Other specified disorders of liver 12/30/2008  . Overweight(278.02) 09/08/2008  . HYPERLIPIDEMIA 01/28/2008  . HYPERTENSION 01/28/2008  . Seizure disorder 01/28/2008  . OSTEOPENIA 07/01/2007   Past Medical History: Past Medical History  Diagnosis Date  . Seizures   .  Hypertension   . GERD (gastroesophageal reflux disease)   . Dyslipidemia   . Schizoaffective disorder   . Depression   . Anxiety   . MRSA carrier   . Constipation   . Memory loss   . Hearing loss     Past Surgical History: Past Surgical History  Procedure Laterality Date  . Self orchectomy    . Colonoscopy  2010  . Orif shoulder fracture    . Shoulder closed reduction Left 09/16/2013    Procedure: CLOSED REDUCTION SHOULDER;  Surgeon: Mauri Pole, MD;  Location: [facility];  Service: Orthopedics;  Laterality: Left;  . Shoulder hemi-arthroplasty Left 09/18/2013    Procedure: LEFT SHOULDER HEMI-ARTHROPLASTY;  Surgeon: Augustin Schooling, MD;  Location: [place];  Service: Orthopedics;  Laterality: Left;  . Carpal tunnel release     Social History: History  Substance Use Topics  . Smoking status: Never Smoker   . Smokeless tobacco: Never Used  . Alcohol Use: No   Please also refer to relevant sections of EMR.  Family History: Family History  Problem Relation Age of Onset  . Stroke Father     Living at 49  . Stroke Mother     Stroke in late 61's. Died in her 78's   Allergies and Medications: Allergies  Allergen Reactions  . Codeine Nausea And Vomiting  . Sulfa Antibiotics Nausea And Vomiting   No current facility-administered medications on file prior to encounter.   Current Outpatient Prescriptions on File Prior to Encounter  Medication Sig Dispense Refill  . amitriptyline (ELAVIL) 150 MG tablet Take 2 tablets (300 mg total) by mouth at bedtime. 60 tablet 3  . amitriptyline (ELAVIL) 50 MG tablet Take 50 mg by mouth at bedtime.    Marland Kitchen aspirin EC 81 MG tablet Take 81 mg by mouth daily.    . clonazePAM (KLONOPIN) 2 MG tablet Take 2 mg by mouth 4 (four) times daily.    . [employer] SENNA 8.6 MG tablet TAKE 1 TABLET (8.6 MG TOTAL) BY MOUTH DAILY. 90 tablet 0  . Lacosamide 100 MG TABS Take 1.5 tablets (150 mg total) by mouth 2 (two) times daily. 90 tablet 5  . metoprolol succinate (TOPROL-XL) 50 MG 24 hr tablet TAKE 1 TABLET (50 MG TOTAL) BY MOUTH AT BEDTIME. TAKE WITH OR IMMEDIATELY FOLLOWING A MEAL. 30 tablet 1  . phenytoin (DILANTIN) 100 MG ER capsule Take 2 capsules (200 mg total) by  mouth at bedtime. 60 capsule 6  . polyethylene glycol (MIRALAX / GLYCOLAX) packet Take 17 g by mouth 2 (two) times daily as needed. For constipation. Mix into 8 ounces of fluid & drink 100 each 6  . QUEtiapine (SEROQUEL) 300 MG tablet Take 900 mg by mouth at bedtime.  2  . Selenium Sulfide 2.25 % SHAM Apply 1 application topically daily. To the scalp 3 to 6 times per week. 180 mL 0  . simvastatin (ZOCOR) 40 MG tablet Take 1 tablet (40 mg total) by mouth at bedtime. 30 tablet 6  . testosterone cypionate (DEPOTESTOTERONE CYPIONATE) 200 MG/ML injection Inject 2 mLs (400 mg total) into the muscle every 14 (fourteen) days. 10 mL 0  . QUEtiapine (SEROQUEL) 400 MG tablet Take 1 tablet (400 mg total) by mouth 2 (two) times daily. (Patient not taking: Reported on 04/29/2015) 60 tablet 0    Objective: BP 145/83 mmHg  Pulse 85  Temp(Src) 98.9 F (37.2 C) (Oral)  Resp 18  SpO2 99% Exam: General: Laying in bed, NAD,  cooperative, pleasant HEENT: NCAT, MMM, PERRL, EOMI, OP clear Cardiovascular: RRR, no m/r/g, 2+ DP pulses b/l Respiratory: CTAB, no w/r/c. Normal WOB Abdomen: Soft, NTND, +BS. No rebound/guarding Extremities: WWP, no edema Skin: no rashes Neuro: AAOx3 (initially thinks year is 12, but admits that he was confused and thought I was asking about his birthday), answers questions appropriately, CN 2-12 intact, no focal deficits, strength 5/5 in all extremities.  Labs and Imaging: CBC BMET   Recent Labs Lab 06/01/15 1848  WBC 9.6  HGB 14.0  HCT 40.4  PLT 158    Recent Labs Lab 06/01/15 1737  NA 138  K 3.9  CL 104  CO2 24  BUN 12  CREATININE 1.07  GLUCOSE 128*  CALCIUM 8.9     Troponin 0 Phenytoin level 12.6  Urinalysis    Component Value Date/Time   COLORURINE YELLOW 06/01/2015 1705   APPEARANCEUR CLOUDY* 06/01/2015 1705   LABSPEC 1.013 06/01/2015 1705   PHURINE 6.5 06/01/2015 1705   GLUCOSEU NEGATIVE 06/01/2015 1705   HGBUR NEGATIVE 06/01/2015 1705   HGBUR  negative 07/18/2010 1436   [place] 06/01/2015 [place] 06/01/2015 1705   PROTEINUR NEGATIVE 06/01/2015 1705   UROBILINOGEN 0.2 06/01/2015 1705   NITRITE NEGATIVE 06/01/2015 1705   LEUKOCYTESUR NEGATIVE 06/01/2015 1705     EKG: NSR, nonischemic, unchanged from previous  Ct Head Wo Contrast 06/01/2015    IMPRESSION: Negative head CT.    Virginia Crews, MD 06/02/2015, 2:23 AM PGY-1, [facility] pager: [phone], text pages welcome   FPTS Upper-Level Resident Addendum  I have independently interviewed and examined the patient. I have discussed the above with the original author and agree with their documentation. My edits for correction/addition/clarification are in pink. Please see also any attending notes.   Frazier Richards, MD PGY-2, [place] Service pager: [phone] (text pages welcome through [facility])

## 2015-06-02 NOTE — Consult Note (Signed)
Reason for Consult: Breakthrough seizures Referring Physician: Dr Mingo Amber  CC: Seizures  HPI: [name] is a 59 y.o. male with history of a seizure disorder, hypertension, dyslipidemia, schizoaffective disorder, cardiomyopathy with EF of 35-40% by echo in 2013, memory loss, and hearing loss who was admitted to [facility] early this morning with altered mental status and for breakthrough seizures within the last 48 hours. Home seizure medications include Klonopin 2 mg 4 times daily, Dilantin 100 mg ER (2) at bedtime, and Vimpat 150 mg twice daily.A Dilantin level on 06/01/2015 was therapeutic at 12.6. The patient's Dilantin dose was increased to 300 mg at bedtime on admission.   The patient's seizure disorder is followed by Dr. Floyde Parkins at [facility]. An EEG was performed on 05/05/2015 and was found to be abnormal. (Please see the report below). An MRI of the brain with and without contrast was performed on 05/02/2015. (This report is also listed below). A CT of the head performed 06/01/2015 was negative. Neurology has been asked to evaluate the patient.  The patient reports that he has had increased seizure activity over the past 6 months. He estimates that he has had approximately 18 seizures over that time period. He believes he has had at least 40 seizures in his lifetime. He is very concerned about his memory which he feels is getting much worse. He feels he is unable to carry on a conversation because ofword finding difficulties and often forgets what he was talking about in the middle of a sentence. He presented to the hospital for further evaluation after having another seizure.     Past Medical History  Diagnosis Date  . Seizures   . Hypertension   . GERD (gastroesophageal reflux disease)   . Dyslipidemia   . Schizoaffective disorder   . Depression   . Anxiety   . MRSA carrier   . Constipation   . Memory loss   . Hearing loss     Past Surgical  History  Procedure Laterality Date  . Self orchectomy    . Colonoscopy  2010  . Orif shoulder fracture    . Shoulder closed reduction Left 09/16/2013    Procedure: CLOSED REDUCTION SHOULDER;  Surgeon: Mauri Pole, MD;  Location: [facility];  Service: Orthopedics;  Laterality: Left;  . Shoulder hemi-arthroplasty Left 09/18/2013    Procedure: LEFT SHOULDER HEMI-ARTHROPLASTY;  Surgeon: Augustin Schooling, MD;  Location: [place];  Service: Orthopedics;  Laterality: Left;  . Carpal tunnel release      Family History  Problem Relation Age of Onset  . Stroke Father     Living at 26  . Stroke Mother     Stroke in late 63's. Died in her 47's    Social History:  reports that he has never smoked. He has never used smokeless tobacco. He reports that he does not drink alcohol or use illicit drugs.  Allergies  Allergen Reactions  . Codeine Nausea And Vomiting  . Sulfa Antibiotics Nausea And Vomiting    Medications:  Scheduled: . amitriptyline  350 mg Oral QHS  . aspirin EC  81 mg Oral Daily  . clonazePAM  2 mg Oral TID AC & HS  . enoxaparin (LOVENOX) injection  40 mg Subcutaneous Daily  . lacosamide  150 mg Oral BID  . metoprolol succinate  50 mg Oral Daily  . phenytoin  300 mg Oral QHS  . QUEtiapine  900 mg Oral QHS  . senna  1  tablet Oral QHS  . simvastatin  40 mg Oral QHS    ROS: History obtained from the patient  General ROS: negative for - chills, fatigue, fever, night sweats, weight gain or weight loss Psychological ROS: negative for - behavioral disorder, hallucinations, memory difficulties, mood swings or suicidal ideation. Positive for increased anxiety and panic attacks. Ophthalmic ROS: negative for - blurry vision, double vision, eye pain or loss of vision ENT ROS: negative for - epistaxis, nasal discharge, oral lesions, sore throat, tinnitus or vertigo Allergy and Immunology ROS: negative for - hives or itchy/watery eyes Hematological and Lymphatic ROS: negative for - bleeding  problems, bruising or swollen lymph nodes Endocrine ROS: negative for - galactorrhea, hair pattern changes, polydipsia/polyuria or temperature intolerance Respiratory ROS: negative for - cough, hemoptysis, shortness of breath or wheezing Cardiovascular ROS: negative for - chest pain, dyspnea on exertion, edema or irregular heartbeat Gastrointestinal ROS: negative for - abdominal pain, diarrhea, hematemesis, nausea/vomiting or stool incontinence Genito-Urinary ROS: negative for - dysuria, hematuria, incontinence or urinary frequency/urgency Musculoskeletal ROS: negative for - joint swelling or muscular weakness Neurological ROS: as noted in HPI Dermatological ROS: negative for rash and skin lesion changes   Physical Examination: Blood pressure 145/83, pulse 85, temperature 98.9 F (37.2 C), temperature source Oral, resp. rate 18, SpO2 99 %.   General - pleasant mildly anxious 59 year old male in no acute distress. Heart - Regular rate and rhythm  Lungs - Clear to auscultation Abdomen - Soft - non tender Extremities - Distal pulses intact - no edema Skin - Warm and dry   Neurologic Examination Mental Status: Alert, oriented. Able to recall 2 out of 3 words in approximately 30 seconds.  Speech fluent without evidence of aphasia.  Able to follow 3 step commands without difficulty. Cranial Nerves: II: Discs not visualized; Visual fields grossly normal, pupils equal, round, reactive to light and accommodation III,IV, VI: ptosis not present, extra-ocular motions intact bilaterally V,VII: smile symmetric, facial light touch sensation normal bilaterally VIII: hearing normal bilaterally IX,X: gag reflex present XI: bilateral shoulder shrug XII: midline tongue extension Motor: Right : Upper extremity   5/5    Left:     Upper extremity   5/5  Lower extremity   5/5     Lower extremity   5/5 Tone and bulk:normal tone throughout; no atrophy noted Sensory: Pinprick and light touch intact  throughout, bilaterally Deep Tendon Reflexes: 2+ and symmetric throughout Plantars: Right: upgoing   Left: upgoing Cerebellar: normal finger-to-nose,  Gait: Deferred CV: pulses palpable throughout   Laboratory Studies:   Basic Metabolic Panel:  Recent Labs Lab 05/30/15 2027 06/01/15 1737 06/02/15 0554  NA 137 138 141  K 3.7 3.9 3.5  CL 104 104 107  CO2 25 24 26   GLUCOSE 132* 128* 115*  BUN 14 12 9   CREATININE 0.98 1.07 0.89  CALCIUM 8.7* 8.9 8.7*    Liver Function Tests:  Recent Labs Lab 06/01/15 1737  AST 22  ALT 21  ALKPHOS 95  BILITOT 0.5  PROT 7.3  ALBUMIN 4.3   No results for input(s): LIPASE, AMYLASE in the last 168 hours. No results for input(s): AMMONIA in the last 168 hours.  CBC:  Recent Labs Lab 05/30/15 2027 06/01/15 1737 06/01/15 1848 06/02/15 0554  WBC 8.3 SPECIMEN CLOTTED NOTIFIED RN @ 1800 9.6 6.1  NEUTROABS 6.5 PENDING 7.7  --   HGB 14.3 SPECIMEN CLOTTED NOTIFIED RN @ 1800 14.0 13.8  HCT 41.3 SPECIMEN CLOTTED NOTIFIED RN @ 1800 40.4  39.8  MCV 92.6 SPECIMEN CLOTTED NOTIFIED RN @ 1800 91.4 91.5  PLT 172 SPECIMEN CLOTTED NOTIFIED RN @ 1800 158 152    Cardiac Enzymes: No results for input(s): CKTOTAL, CKMB, CKMBINDEX, TROPONINI in the last 168 hours.  BNP: Invalid input(s): POCBNP  CBG:  Recent Labs Lab 05/30/15 2040  GLUCAP 134*    Microbiology: Results for orders placed or performed during the hospital encounter of 06/01/15  MRSA PCR Screening     Status: None   Collection Time: 06/02/15  1:12 AM  Result Value Ref Range Status   MRSA by PCR NEGATIVE NEGATIVE Final    Comment:        The GeneXpert MRSA Assay (FDA approved for NASAL specimens only), is one component of a comprehensive MRSA colonization surveillance program. It is not intended to diagnose MRSA infection nor to guide or monitor treatment for MRSA infections.     Coagulation Studies: No results for input(s): LABPROT, INR in the last 72  hours.  Urinalysis:  Recent Labs Lab 06/01/15 1705  COLORURINE YELLOW  LABSPEC 1.013  PHURINE 6.5  GLUCOSEU NEGATIVE  HGBUR NEGATIVE  BILIRUBINUR NEGATIVE  KETONESUR NEGATIVE  PROTEINUR NEGATIVE  UROBILINOGEN 0.2  NITRITE NEGATIVE  LEUKOCYTESUR NEGATIVE    Lipid Panel:     Component Value Date/Time   CHOL 149 12/21/2013 1706   TRIG 195* 10/20/2014 2104   HDL 53 12/21/2013 1706   CHOLHDL 2.8 12/21/2013 1706   VLDL 32 12/21/2013 1706   LDLCALC 64 12/21/2013 1706    HgbA1C:  Lab Results  Component Value Date   HGBA1C 5.3 05/19/2014    Urine Drug Screen:     Component Value Date/Time   LABOPIA NONE DETECTED 02/15/2015 1520   COCAINSCRNUR NONE DETECTED 02/15/2015 1520   LABBENZ POSITIVE* 02/15/2015 1520   AMPHETMU NONE DETECTED 02/15/2015 1520   THCU NONE DETECTED 02/15/2015 1520   LABBARB NONE DETECTED 02/15/2015 1520    Alcohol Level: No results for input(s): ETH in the last 168 hours.  Other results: EKG: Sinus rhythm rate 85 bpm. Please refer to the formal cardiology reading for complete details.     EEG 05/05/2015 Impression: This is an abnormal EEG recording secondary to mild dysrhythmic activity emanating from the temporal regions bilaterally suggesting bihemispheric abnormalities, and a lowered seizure threshold. No electrographic seizures were seen.    Imaging:  Ct Head Wo Contrast 06/01/2015    Negative head CT.      MRI Brain W Wo Contrast 05/02/2015 IMPRESSION: Slightly abnormal MRI scan of the brain showing mild age-related changes of chronic microvascular ischemia and generalized cerebral atrophy. No significant change compared with CT scan brain 02/15/2015   Assessment/Plan:  59 year old male with long history of seizures now with multiple breakthrough episodes and progressively deteriorating memory. Situation probably complicated by schizoaffective disorder.  Dr. Leonel Ramsay to see patient for further assessment/plan.  - Per Dr  Leonel Ramsay increase Vimpat to 200 mg twice daily.   Mikey Bussing PA-C [name] Pager [phone] 06/02/2015, 8:05 AM   I have seen and evaluated the patient. I have reviewed the above note and made appropriate changes.    Increasing seizure frequency in a known epileptic. He complains of memory problems and wonders if he does take his medications. I do wonder if he is missing doses of klonopin and at his current dose he would certainly be at risk for withdrawal.   He is on high doses of multiple psychoactive medications including vimpat, dilantin, seroquel, elavil(which  happens to be anti-cholinergic and can excacerbate some memory problems), klonopin.   I wonder if lamotrigine would be a better choice for him in the long term, given it is less sedating and has mood stabilization properties but would not pursue this at this time.   Pseudodementia could also be a possibility.   For now, would increase vimapt and consider neuropsych testing as outpatient.   1) Increase vimpat to 200mg  BID 2) agree with dilantin increase 3) consider neuropsych testing as outpatient. 4) Neurology will follow.

## 2015-06-03 ENCOUNTER — Telehealth: Payer: Self-pay | Admitting: Neurology

## 2015-06-03 ENCOUNTER — Ambulatory Visit (HOSPITAL_COMMUNITY): Payer: Medicaid Other

## 2015-06-03 DIAGNOSIS — I509 Heart failure, unspecified: Secondary | ICD-10-CM

## 2015-06-03 DIAGNOSIS — K5900 Constipation, unspecified: Secondary | ICD-10-CM | POA: Insufficient documentation

## 2015-06-03 MED ORDER — SENNOSIDES-DOCUSATE SODIUM 8.6-50 MG PO TABS: 1.0000 | ORAL_TABLET | Freq: Every day | ORAL | Status: DC

## 2015-06-03 MED ORDER — PHENYTOIN SODIUM EXTENDED 100 MG PO CAPS: 300.0000 mg | ORAL_CAPSULE | Freq: Every day | ORAL | Status: DC

## 2015-06-03 MED ORDER — LACOSAMIDE 100 MG PO TABS: 200.0000 mg | ORAL_TABLET | Freq: Two times a day (BID) | ORAL | Status: DC

## 2015-06-03 NOTE — Progress Notes (Signed)
  Echocardiogram 2D Echocardiogram has been performed.  [name] 06/03/2015, 1:33 PM

## 2015-06-03 NOTE — Telephone Encounter (Signed)
Patients wife called and stated that the patient needs to be seen within the next week. She is concerned about his condition and states that he is getting worse. Please call and advise.

## 2015-06-03 NOTE — Discharge Summary (Signed)
[facility] Discharge Summary  Patient name: [name] Medical record number: [id number] Date of birth: 02-01-1956 Age: 59 y.o. Gender: male Date of Admission: 06/01/2015  Date of Discharge: 06/03/2015  Admitting Physician: Alveda Reasons, MD  Primary Care Provider: Chrisandra Netters, MD Consultants: neurology  Indication for Hospitalization: breakthrough seizures  Discharge Diagnoses/Problem List:  Seizure disorder with breakthrough seizures Schizoaffective disorder Depression anxiety constipation  Disposition: home  Discharge Condition: stable  Discharge Exam: see progress note from day of discharge  Brief Hospital Course:  [name] is a 59 y.o. male with PMH of seizure disorder, severe schizzoaffective disorder, depression, anxiety, HTN, HLD p/w 4 breakthrough seizures in 48h.  Neurology consulted on admission and recommended increasing Vimpat to 200mg  BID and Dilantin to 300mg  qhs.  Dilantin level 12.9 on admission.  Patient had no seizures during admission.  He experienced some abd pain with constipation that resolved after BM.  Treated with miralax, colace, and senna.  All other chronic medical conditions stable throughout admission and managed with home regimens.  Issues for Follow Up:  - f/u that patient is taking medications as prescribed - admitted to possible missed doses - f/u dilantin level - consider neuropsych testing as OP  Significant Procedures: none  Significant Labs and Imaging:   Recent Labs Lab 06/01/15 1737 06/01/15 1848 06/02/15 0554  WBC SPECIMEN CLOTTED NOTIFIED RN @ 1800 9.6 6.1  HGB SPECIMEN CLOTTED NOTIFIED RN @ 1800 14.0 13.8  HCT SPECIMEN CLOTTED NOTIFIED RN @ 1800 40.4 39.8  PLT SPECIMEN CLOTTED NOTIFIED RN @ 1800 158 152    Recent Labs Lab 05/30/15 2027 06/01/15 1737 06/02/15 0554  NA 137 138 141  K 3.7 3.9 3.5  CL 104 104 107  CO2 25 24 26   GLUCOSE 132* 128* 115*  BUN 14 12 9   CREATININE  0.98 1.07 0.89  CALCIUM 8.7* 8.9 8.7*  ALKPHOS  --  95  --   AST  --  22  --   ALT  --  21  --   ALBUMIN  --  4.3  --     Urinalysis    Component Value Date/Time   COLORURINE YELLOW 06/01/2015 1705   APPEARANCEUR CLOUDY* 06/01/2015 1705   LABSPEC 1.013 06/01/2015 1705   PHURINE 6.5 06/01/2015 1705   GLUCOSEU NEGATIVE 06/01/2015 1705   HGBUR NEGATIVE 06/01/2015 1705   HGBUR negative 07/18/2010 1436   BILIRUBINUR NEGATIVE 06/01/2015 [place] 06/01/2015 1705   PROTEINUR NEGATIVE 06/01/2015 1705   UROBILINOGEN 0.2 06/01/2015 1705   NITRITE NEGATIVE 06/01/2015 1705   LEUKOCYTESUR NEGATIVE 06/01/2015 1705     Troponin 0 Phenytoin level 12.6  EKG: NSR, nonischemic, unchanged from previous  Ct Head Wo Contrast 06/01/2015 IMPRESSION: Negative head CT.   Results/Tests Pending at Time of Discharge: none  Discharge Medications:    Medication List    TAKE these medications        amitriptyline 50 MG tablet  Commonly known as:  ELAVIL  Take 50 mg by mouth at bedtime.     amitriptyline 150 MG tablet  Commonly known as:  ELAVIL  Take 2 tablets (300 mg total) by mouth at bedtime.     aspirin EC 81 MG tablet  Take 81 mg by mouth daily.     clonazePAM 2 MG tablet  Commonly known as:  KLONOPIN  Take 2 mg by mouth 4 (four) times daily.     [facility] SENNA 8.6 MG tablet  Generic drug:  senna  TAKE 1 TABLET (8.6 MG TOTAL) BY MOUTH DAILY.     Lacosamide 100 MG Tabs  Take 2 tablets (200 mg total) by mouth 2 (two) times daily.     metoprolol succinate 50 MG 24 hr tablet  Commonly known as:  TOPROL-XL  TAKE 1 TABLET (50 MG TOTAL) BY MOUTH AT BEDTIME. TAKE WITH OR IMMEDIATELY FOLLOWING A MEAL.     phenytoin 100 MG ER capsule  Commonly known as:  DILANTIN  Take 3 capsules (300 mg total) by mouth at bedtime.     polyethylene glycol packet  Commonly known as:  MIRALAX / GLYCOLAX  Take 17 g by mouth 2 (two) times daily as needed. For constipation. Mix into 8  ounces of fluid & drink     QUEtiapine 300 MG tablet  Commonly known as:  SEROQUEL  Take 900 mg by mouth at bedtime.     Selenium Sulfide 2.25 % Sham  Apply 1 application topically daily. To the scalp 3 to 6 times per week.     senna-docusate 8.6-50 MG per tablet  Commonly known as:  Senokot-S  Take 1 tablet by mouth at bedtime.     simvastatin 40 MG tablet  Commonly known as:  ZOCOR  Take 1 tablet (40 mg total) by mouth at bedtime.     testosterone cypionate 200 MG/ML injection  Commonly known as:  DEPOTESTOSTERONE CYPIONATE  Inject 2 mLs (400 mg total) into the muscle every 14 (fourteen) days.        Discharge Instructions: Please refer to Patient Instructions section of EMR for full details.  Patient was counseled important signs and symptoms that should prompt return to medical care, changes in medications, dietary instructions, activity restrictions, and follow up appointments.   Follow-Up Appointments: Follow-up Information    Follow up with Lenor Coffin, MD.   Specialty:  Neurology   Why:  call office tonight or early tomorrow for close follow up   Contact information:   [address] [place] [address] [phone]       Follow up with Nobie Putnam, DO On 06/06/2015.   Specialty:  Osteopathic Medicine   Why:  For hospital follow-up, appt made at 2:30pm   Contact information:   [place] [address] [phone]       Virginia Crews, MD 06/03/2015, 12:00 PM PGY-1 , [place]

## 2015-06-03 NOTE — Care Management Note (Signed)
Case Management Note  Patient Details  Name: ZAYVIEN CANNING MRN: [id number] Date of Birth: 1956/08/10  Subjective/Objective:  Patient for dc today, has not been seen by pt.                    Action/Plan:   Expected Discharge Date:                  Expected Discharge Plan:  [place]  In-House Referral:     Discharge planning Services  CM Consult  Post Acute Care Choice:    Choice offered to:     DME Arranged:    DME Agency:     HH Arranged:    [place] Agency:     Status of Service:  Completed, signed off  Medicare Important Message Given:  No Date Medicare IM Given:    Medicare IM give by:    Date Additional Medicare IM Given:    Additional Medicare Important Message give by:     If discussed at [place] of Stay Meetings, dates discussed:    Additional Comments:  Zenon Mayo, RN 06/03/2015, 2:06 PM

## 2015-06-03 NOTE — Discharge Instructions (Signed)
Please take Vimpat 200mg  twice daily and dilantin 300mg  nightly and call neurology office tonight for close follow up.  Seizure, Adult A seizure is abnormal electrical activity in the brain. Seizures usually last from 30 seconds to 2 minutes. There are various types of seizures. Before a seizure, you may have a warning sensation (aura) that a seizure is about to occur. An aura may include the following symptoms:   Fear or anxiety.  Nausea.  Feeling like the room is spinning (vertigo).  Vision changes, such as seeing flashing lights or spots. Common symptoms during a seizure include:  A change in attention or behavior (altered mental status).  Convulsions with rhythmic jerking movements.  Drooling.  Rapid eye movements.  Grunting.  Loss of bladder and bowel control.  Bitter taste in the mouth.  Tongue biting. After a seizure, you may feel confused and sleepy. You may also have an injury resulting from convulsions during the seizure. HOME CARE INSTRUCTIONS   If you are given medicines, take them exactly as prescribed by your health care provider.  Keep all follow-up appointments as directed by your health care provider.  Do not swim or drive or engage in risky activity during which a seizure could cause further injury to you or others until your health care provider says it is OK.  Get adequate rest.  Teach friends and family what to do if you have a seizure. They should:  Lay you on the ground to prevent a fall.  Put a cushion under your head.  Loosen any tight clothing around your neck.  Turn you on your side. If vomiting occurs, this helps keep your airway clear.  Stay with you until you recover.  Know whether or not you need emergency care. SEEK IMMEDIATE MEDICAL CARE IF:  The seizure lasts longer than 5 minutes.  The seizure is severe or you do not wake up immediately after the seizure.  You have an altered mental status after the seizure.  You are  having more frequent or worsening seizures. Someone should drive you to the emergency department or call local emergency services (911 in [place] MAKE SURE YOU:  Understand these instructions.  Will watch your condition.  Will get help right away if you are not doing well or get worse. Document Released: 11/30/2000 Document Revised: 09/23/2013 Document Reviewed: 07/15/2013 [facility] Patient Information 2015 [place], [place]. This information is not intended to replace advice given to you by your health care provider. Make sure you discuss any questions you have with your health care provider.

## 2015-06-03 NOTE — Progress Notes (Signed)
Subjective: No further sz  Exam: Filed Vitals:   06/03/15 0519  BP: 106/65  Pulse: 68  Temp: 98.9 F (37.2 C)  Resp: 16   Gen: In bed, NAD MS: awake, alert NI:OEVOJ, EOMI Motor: MAEW  Impression: 59 yo M with breakthrough seizures. I do have some concerns that he may be forgetting to take his AM vimpat. I advised to start using pill planners.   Recommendations: 1)continue dilantin 300 qhs 2) continue vimpat 200mg  BID 3) f/u with dr. [name] as outpatient.   Roland Rack, MD [name] [phone]  If 7pm- 7am, please page neurology on call as listed in [place].

## 2015-06-03 NOTE — Progress Notes (Signed)
PT Cancellation Note  Patient Details Name: [name] MRN: [id number] DOB: [birth year]   Cancelled Treatment:    Reason Eval/Treat Not Completed: Patient at procedure or test/unavailable.  Attempted to see patient this am at 12:25 - patient going for Echo.  Returned this pm - patient d/c'ed prior to PT eval.   Despina Pole 06/03/2015, 4:15 PM Carita Pian. Sanjuana Kava, [name] Pager [phone]

## 2015-06-03 NOTE — Telephone Encounter (Signed)
I returned the patient's wife's call and left a voicemail.

## 2015-06-03 NOTE — Progress Notes (Signed)
Family Medicine Teaching Service Daily Progress Note Intern Pager: [phone]  Patient name: [name] Medical record number: [id number] Date of birth: 09-Jun-1956 Age: 59 y.o. Gender: male  Primary Care Provider: Chrisandra Netters, MD Consultants: neuro Code Status: full code  Pt Overview and Major Events to Date:  6/16 - admit for breakthrough seizures  Assessment and Plan: [name] is a 59 y.o. male presenting with 4 breakthrough seizures in last 48 hours . PMH is significant for seizure disorder, severe schizzoaffective disorder, depression, anxiety, HTN, HLD.  AMS, breakthrough seizures: No new seizures since admission. experiencing 2 seizures per week since April, 4 breakthrough seizures in last 48 hrs. ED was prepared to send home before he had another witnessed seizure in ED. Neurology rec increased Dilantin dose when c/s in ED. Reports compliance with medication, phenytoin level 12.6. - Admit to obs, FPTS, attending [facility] - Neuro c/s - appreciate recs - Continue home klonopin - Increased vimpat dose to 200mg  BID per neuro recs - Dilantin dose increase to 300mg  qhs (from 200mg  qhs) per neurology recs - seizure precautions - monitor mental status for improvement after resolution of post-ictal state - consider neuropsych testing as OP  Abd pain 2/2 constipation: Resolved w BM - Continue miralax, senna, colace  Schizoaffective/depression/anxiety: Mood stable on admission - continue home seroquel, elavil, rozerem  HTN, HLD: BP stable - Continue home Zocor, ASA, metoprolol - Continue to monitor  FEN/GI: heart healthy diet, SLIV Prophylaxis: Lovenox  Disposition: D/c pending neuro recs, likely later today  Subjective:  Feels back to baseline today. Abd pain O/N resovled with BM.  He is worried about missing doses of Vimpat, and wonders if missing it will cause him to have a seizure.  Objective: Temp:  [98.7 F (37.1 C)-99.5 F (37.5 C)] 98.9 F (37.2 C) (06/17  0519) Pulse Rate:  [68-81] 68 (06/17 0519) Resp:  [16-20] 16 (06/17 0519) BP: (98-127)/(60-76) 106/65 mmHg (06/17 0519) SpO2:  [95 %-98 %] 95 % (06/17 0519) Physical Exam: General: Laying in bed, NAD, cooperative, pleasant HEENT: NCAT, MMM, PERRL, EOMI, OP clear Cardiovascular: RRR, no m/r/g, 2+ DP pulses b/l Respiratory: CTAB, no w/r/c. Normal WOB Abdomen: Soft, NTND, +BS. No rebound/guarding Extremities: WWP, no edema Skin: no rashes Neuro: AAOx3, answers questions appropriately, CN 2-12 intact, no focal deficits, strength 5/5 in all extremities.  Laboratory:  Recent Labs Lab 06/01/15 1737 06/01/15 1848 06/02/15 0554  WBC SPECIMEN CLOTTED NOTIFIED RN @ 1800 9.6 6.1  HGB SPECIMEN CLOTTED NOTIFIED RN @ 1800 14.0 13.8  HCT SPECIMEN CLOTTED NOTIFIED RN @ 1800 40.4 39.8  PLT SPECIMEN CLOTTED NOTIFIED RN @ 1800 158 152    Recent Labs Lab 05/30/15 2027 06/01/15 1737 06/02/15 0554  NA 137 138 141  K 3.7 3.9 3.5  CL 104 104 107  CO2 25 24 26   BUN 14 12 9   CREATININE 0.98 1.07 0.89  CALCIUM 8.7* 8.9 8.7*  PROT  --  7.3  --   BILITOT  --  0.5  --   ALKPHOS  --  95  --   ALT  --  21  --   AST  --  22  --   GLUCOSE 132* 128* 115*    Troponin 0 Phenytoin level 12.6  Urinalysis    Component Value Date/Time   COLORURINE YELLOW 06/01/2015 1705   APPEARANCEUR CLOUDY* 06/01/2015 1705   LABSPEC 1.013 06/01/2015 1705   PHURINE 6.5 06/01/2015 1705   GLUCOSEU NEGATIVE 06/01/2015 1705   HGBUR  NEGATIVE 06/01/2015 1705   HGBUR negative 07/18/2010 1436   BILIRUBINUR NEGATIVE 06/01/2015 1705   KETONESUR NEGATIVE 06/01/2015 1705   PROTEINUR NEGATIVE 06/01/2015 1705   UROBILINOGEN 0.2 06/01/2015 1705   NITRITE NEGATIVE 06/01/2015 1705   LEUKOCYTESUR NEGATIVE 06/01/2015 1705     Imaging/Diagnostic Tests: EKG: NSR, nonischemic, unchanged from previous  Ct Head Wo Contrast 06/01/2015 IMPRESSION: Negative head CT.    Virginia Crews, MD 06/03/2015, 8:51 AM PGY-1,  [place] Intern pager: [phone], text pages welcome

## 2015-06-06 ENCOUNTER — Encounter: Payer: Self-pay | Admitting: Family Medicine

## 2015-06-06 ENCOUNTER — Ambulatory Visit (INDEPENDENT_AMBULATORY_CARE_PROVIDER_SITE_OTHER): Payer: Medicaid Other | Admitting: Family Medicine

## 2015-06-06 VITALS — BP 139/85 | HR 84 | Temp 99.0°F | Ht 71.0 in | Wt 197.8 lb

## 2015-06-06 DIAGNOSIS — I10 Essential (primary) hypertension: Secondary | ICD-10-CM | POA: Diagnosis not present

## 2015-06-06 DIAGNOSIS — G40909 Epilepsy, unspecified, not intractable, without status epilepticus: Secondary | ICD-10-CM

## 2015-06-06 DIAGNOSIS — K5900 Constipation, unspecified: Secondary | ICD-10-CM

## 2015-06-06 DIAGNOSIS — F251 Schizoaffective disorder, depressive type: Secondary | ICD-10-CM | POA: Diagnosis not present

## 2015-06-06 DIAGNOSIS — E785 Hyperlipidemia, unspecified: Secondary | ICD-10-CM

## 2015-06-06 MED ORDER — SIMVASTATIN 40 MG PO TABS: 40.0000 mg | ORAL_TABLET | Freq: Every day | ORAL | Status: DC

## 2015-06-06 MED ORDER — METOPROLOL SUCCINATE ER 50 MG PO TB24: ORAL_TABLET | ORAL | Status: DC

## 2015-06-06 NOTE — Assessment & Plan Note (Addendum)
Stable, chronic seizure disorder. Recent hospitalization for breakthrough seizures. Suspect chronic seizure burden is contributing to worsening psych disorder / dementia, given chronic CNS insults. - last Dilantin level 12.9 (06/01/15)  Plan: 1. Continue discharge recs - Vimpat 200mg  BID (from 150 BID), Dilantin 300mg  qhs (from 200 qhs) - has 1 month supply of each rx given on discharge 2. Deferred Dilantin blood level today - only 5 days since last check. Will need to be checked this week by Neurology, if unable to schedule pt can return to [facility] for lab check by Weds/Thurs. Discussed with Dr. Erin Hearing 3. Scheduling hosp f/u with Dr. [name] - Neurology this week

## 2015-06-06 NOTE — Patient Instructions (Signed)
Dear [name], Thank you for coming in to clinic today for hospital follow-up.  1. Overall I am glad to hear that you have not had any more seizures. 2. Keep taking current medications as discussed. 3. No lab check today for Dilantin level - it is too early 4. Important to schedule the follow-up with Dr. [name] - Neurology this week, and ask that they check the Dilantin Level at that time. - If you are unable to schedule this follow-up for this week, please call us back and ask to return to our clinic for a Dilantin lab check.  Very important to follow-up with [facility] - they will most likely discuss a future plan involving Neuropsychiatric Testing - in all patients with long history of psych and seizure disorder, these problems will affect all functions of our brains eventually, and it can cause a lot of your symptoms, with memory and daily function. These problems are very difficult to treat.  Please schedule a follow-up appointment at [facility] in 3 months as needed.  If you have any other questions or concerns, please feel free to call the clinic to contact me. You may also schedule an earlier appointment if necessary.  However, if your symptoms get significantly worse, please go to the Emergency Department to seek immediate medical attention.  Nobie Putnam, [place]

## 2015-06-06 NOTE — Assessment & Plan Note (Signed)
Chronic psychiatric disorders with schizoaffective, MDD, anxiety, complicated by chronic seizures. Suspect recurrent seizures have contributed to worsening psych status with repetitive CNS insults, now may be developing early dementia symptoms with memory loss, personality change, limited focus, and concerns with med-compliance. - recent MRI 06/01/15 - generalized atrophy (no acute change)  Plan: 1. Has new apt to establish with [facility] upcoming within 1 month - important to f/u and establish Psychiatrist, will need therapy/counseling 2. Recommended for outpatient Neuropsychiatric testing in future, goals to improve function 3. Future patient may need higher level supervision / care if progressive dementia with chronic CNS insults / psych / seizures

## 2015-06-06 NOTE — Assessment & Plan Note (Signed)
Stable, chronic issue  Plan: 1. Advised to take Miralax regularly, titrate to regular soft stools to avoid diarrhea / loose and needs to control constipation better 2. Can continue sennakot PRN 3. Improve hydration

## 2015-06-06 NOTE — Progress Notes (Signed)
   Subjective:    Patient ID: [name], male    DOB: [birth year], 59 y.o.   MRN: [id number]  History provided both by patient and wife during visit.  HPI  Hospital follow-up 06/01/15 to 06/03/15 for breakthrough seizures  SEIZURE DISORDER: - Hospitalized recently due to breakthrough seizures in setting of chronic known seizure disorder, with 4 seizures in 48 hr prior to admit. Neurology consulted and increased Vimpat from 150 to 200mg  BID and Dilantin from 200 to 300mg  qhs. No further episodes of seizures in hospital. - Followed by Neurology outpt (Dr. [name] - [facility]), awaiting hospital follow-up apt this week (called to schedule), otherwise has apt in August - Currently reports he is doing well, without further seizure episodes since discharge. No new concerns. - Last Dilantin level checked on admit 06/01/15 at 12.9 (therapeutic) - Tolerating current medication doses. Has updated med list and overall seems to know his daily doses, denies missing any doses. - Denies any fever/chills, seizure activity, HA, CP, SOB, weakness, numbness, tingling  PSYCH: CHRONIC SCHIZOAFFECTIVE DISORDER, DEPRESSION, ANXIETY: - Chronic known history of multiple severe psychiatric disorders. Previously followed by Psychiatry (has not been in > 1 year, missed apt then dismissed), however wife/caregiver feels like he needed a new Psychiatrist. - No recent or current changes. However caregiver reports significant growing concerns of memory loss, personality change, limited ability to focus, worsening over past at least >6 months - Neurology in hospital recommended outpatient Neuropsychiatric testing - MRI in hospital with generalized cerebral atrophy (stable, without acute change) - Denies active depression, anxiety, sadness, suicidal or homicidal ideation  CHRONIC CONSTIPATION: - Chronic problem, seems to be medication induced - Currently taking Miralax as needed with good results, sometimes goes 3-5 days to week  without taking, wants to avoid diarrhea - In hospital was constipated, relieved with Miralax  I have reviewed and updated the following as appropriate: allergies and current medications  Social Hx:  - Non-smoker  Review of Systems  See above HPI    Objective:   Physical Exam  BP 139/85 mmHg  Pulse 84  Temp(Src) 99 F (37.2 C) (Oral)  Ht 5\' 11"  (1.803 m)  Wt 197 lb 12.8 oz (89.721 kg)  BMI 27.60 kg/m2  Gen - chronically ill-appearing but currently well, cooperative, and comfortable, NAD HEENT - NCAT, PERRL, EOMI, dry mouth Neck - supple, non-tender Heart - RRR, no murmurs heard Abd - soft, NTND, +active BS Ext - non-tender, no edema, peripheral pulses intact +2 b/l Skin - warm, dry, no rashes Neuro - awake, alert, oriented, grossly non-focal, intact muscle strength 5/5 b/l grip / ankle dorsiflexion, intact distal sensation to light touch, gait normal     Assessment & Plan:   See specific A&P problem list for details.

## 2015-06-07 ENCOUNTER — Telehealth: Payer: Self-pay | Admitting: Neurology

## 2015-06-07 MED ORDER — LACOSAMIDE 200 MG PO TABS: 200.0000 mg | ORAL_TABLET | Freq: Two times a day (BID) | ORAL | Status: DC

## 2015-06-07 NOTE — Telephone Encounter (Signed)
Appointment made for 6/23 

## 2015-06-07 NOTE — Telephone Encounter (Signed)
Per Dr. [name], okay to give sample box of Vimpat. I called the patient. He will come by to get it.

## 2015-06-07 NOTE — Telephone Encounter (Signed)
I contacted ins.  Provided clinical info.  Request is currently under review.  Ref Key: [id number].  I called Ms [name] back to advise.  Got no answer.  Left message.

## 2015-06-07 NOTE — Telephone Encounter (Signed)
Patient's wife [name]) called stating pharmacy needs prior auth forLacosamide 100 MG TABS. The hospital changed directions to twice daily and he has ran out. Please call and advise. She can be reached at [phone].

## 2015-06-07 NOTE — Telephone Encounter (Signed)
I spoke to the patient. The doctor in the ED wrote a Rx for Vimpat but before the patient can get it, it needs prior authorization. He is completely out (and even missed his morning dose) and is scared of continuing to have seizures if he cannot get the medication ASAP. Can he come in to pick up a sample box?

## 2015-06-07 NOTE — Telephone Encounter (Signed)
Events noted. We could easily switch the patient to the 200 mg tablets twice daily.

## 2015-06-09 ENCOUNTER — Encounter: Payer: Self-pay | Admitting: Neurology

## 2015-06-09 ENCOUNTER — Ambulatory Visit (INDEPENDENT_AMBULATORY_CARE_PROVIDER_SITE_OTHER): Payer: Medicaid Other | Admitting: Neurology

## 2015-06-09 VITALS — BP 118/72 | HR 74 | Ht 71.0 in | Wt 201.2 lb

## 2015-06-09 DIAGNOSIS — R569 Unspecified convulsions: Secondary | ICD-10-CM | POA: Diagnosis not present

## 2015-06-09 DIAGNOSIS — R404 Transient alteration of awareness: Secondary | ICD-10-CM | POA: Diagnosis not present

## 2015-06-09 DIAGNOSIS — Z5181 Encounter for therapeutic drug level monitoring: Secondary | ICD-10-CM

## 2015-06-09 NOTE — Patient Instructions (Signed)

## 2015-06-09 NOTE — Progress Notes (Signed)
Reason for visit: Seizures  [name] is an 59 y.o. male  History of present illness:  [name] is a 59 year old left-handed white male with a history of intractable seizures. The patient has recently been in the hospital on 06/01/2015 with increased seizure frequency. The patient had about 3 seizures just prior to admission, and had one seizure in the hospital. The Dilantin level was increased from 200 mg daily to 300 mg daily. The Vimpat was increased from 150 mg twice daily to 200 mg twice daily. Since coming out of the hospital, the patient has had some increased problems with forgetfulness, gait instability, and slurred speech. The patient is on Seroquel, he also takes clonazepam taking 2 mg 4 times daily. He takes amitriptyline as well. The patient has not had any overt seizures since coming out of the hospital. The patient may have a staring episode, or he may have some jerking with the seizures. EEG evaluations on 2 occasions were abnormal. He returns to the office today for an evaluation.  Past Medical History  Diagnosis Date  . Seizures   . Hypertension   . GERD (gastroesophageal reflux disease)   . Dyslipidemia   . Schizoaffective disorder   . Depression   . Anxiety   . MRSA carrier   . Constipation   . Memory loss   . Hearing loss     Past Surgical History  Procedure Laterality Date  . Self orchectomy    . Colonoscopy  2010  . Orif shoulder fracture    . Shoulder closed reduction Left 09/16/2013    Procedure: CLOSED REDUCTION SHOULDER;  Surgeon: Mauri Pole, MD;  Location: [facility];  Service: Orthopedics;  Laterality: Left;  . Shoulder hemi-arthroplasty Left 09/18/2013    Procedure: LEFT SHOULDER HEMI-ARTHROPLASTY;  Surgeon: Augustin Schooling, MD;  Location: [facility];  Service: Orthopedics;  Laterality: Left;  . Carpal tunnel release      Family History  Problem Relation Age of Onset  . Stroke Father     Living at 61  . Stroke Mother     Stroke in late 31's.  Died in her 48's    Social history:  reports that he has never smoked. He has never used smokeless tobacco. He reports that he does not drink alcohol or use illicit drugs.    Allergies  Allergen Reactions  . Codeine Nausea And Vomiting  . Sulfa Antibiotics Nausea And Vomiting    Medications:  Prior to Admission medications   Medication Sig Start Date End Date Taking? Authorizing Provider  amitriptyline (ELAVIL) 150 MG tablet Take 2 tablets (300 mg total) by mouth at bedtime. 11/29/14  Yes Frazier Richards, MD  aspirin EC 81 MG tablet Take 81 mg by mouth daily.   Yes Historical Provider, MD  clonazePAM (KLONOPIN) 2 MG tablet Take 2 mg by mouth 4 (four) times daily.   Yes Historical Provider, MD  [facility] SENNA 8.6 MG tablet TAKE 1 TABLET (8.6 MG TOTAL) BY MOUTH DAILY. 05/06/15  Yes Leeanne Rio, MD  lacosamide (VIMPAT) 200 MG TABS tablet Take 1 tablet (200 mg total) by mouth 2 (two) times daily. 06/07/15  Yes Kathrynn Ducking, MD  metoprolol succinate (TOPROL-XL) 50 MG 24 hr tablet TAKE 1 TABLET (50 MG TOTAL) BY MOUTH AT BEDTIME. TAKE WITH OR IMMEDIATELY FOLLOWING A MEAL. 06/06/15  Yes Alexander Devin Going, DO  phenytoin (DILANTIN) 100 MG ER capsule Take 3 capsules (300 mg total) by mouth at bedtime.  06/03/15  Yes Virginia Crews, MD  polyethylene glycol (MIRALAX / [place]) packet Take 17 g by mouth 2 (two) times daily as needed. For constipation. Mix into 8 ounces of fluid & drink 12/31/14  Yes Leeanne Rio, MD  QUEtiapine (SEROQUEL) 300 MG tablet Take 900 mg by mouth at bedtime. 03/28/15  Yes Historical Provider, MD  Selenium Sulfide 2.25 % SHAM Apply 1 application topically daily. To the scalp 3 to 6 times per week. 05/17/15  Yes Archie Patten, MD  senna-docusate (SENOKOT-S) 8.6-50 MG per tablet Take 1 tablet by mouth at bedtime. 06/03/15  Yes Virginia Crews, MD  simvastatin (ZOCOR) 40 MG tablet Take 1 tablet (40 mg total) by mouth at bedtime. 06/06/15  Yes Alexander Devin Going, DO  testosterone cypionate (DEPOTESTOTERONE CYPIONATE) 200 MG/ML injection Inject 2 mLs (400 mg total) into the muscle every 14 (fourteen) days. 03/08/15  Yes Leeanne Rio, MD    ROS:  Out of a complete 14 system review of symptoms, the patient complains only of the following symptoms, and all other reviewed systems are negative.  Memory disturbance Gait disturbance  Blood pressure 118/72, pulse 74, height 5\' 11"  (1.803 m), weight 201 lb 3.2 oz (91.264 kg).  Physical Exam  General: The patient is alert and cooperative at the time of the examination.  Skin: No significant peripheral edema is noted.   Neurologic Exam  Mental status: The patient is alert and oriented x 3 at the time of the examination. The patient has apparent normal recent and remote memory, with an apparently normal attention span and concentration ability.   Cranial nerves: Facial symmetry is present. Speech is slightly dysarthric. Extraocular movements are full. Visual fields are full.  Motor: The patient has good strength in all 4 extremities.  Sensory examination: Soft touch sensation is symmetric on the face, arms, and legs.  Coordination: The patient has good finger-nose-finger and heel-to-shin bilaterally.  Gait and station: The patient has a normal gait. Tandem gait is unsteady. Romberg is negative. No drift is seen.  Reflexes: Deep tendon reflexes are symmetric.   MRI brain 05/02/15:  IMPRESSION: Slightly abnormal MRI scan of the brain showing mild age-related changes of chronic microvascular ischemia and generalized cerebral atrophy. No significant change compared with CT scan brain 02/15/2015   Assessment/Plan:  1. Intractable seizure disorder  2. Schizoaffective disorder  The patient has had increases in both the Dilantin and Vimpat, he may be having some increased side effects from medication with impaired memory and gait. I have ordered a Dilantin level today, the  patient does not wish to have the blood work done today, he may come back in a week or so to get the level checked. If the seizures continue, the Vimpat may need to be tapered off, and Lamictal can be started. The patient may be a candidate in the future for video EEG monitoring for possible epilepsy surgery. The patient will follow-up in 4 months.  [name] MD 06/09/2015 7:57 PM  [facility] [address] [place], [place] [address]  Phone [phone] Fax [phone]

## 2015-06-10 ENCOUNTER — Telehealth: Payer: Self-pay | Admitting: Neurology

## 2015-06-10 NOTE — Telephone Encounter (Signed)
[name] called and wanted to know if the patients medication Rx. lacosamide (VIMPAT) 200 MG TABS tablet is covered by medicare. Please call and advise.

## 2015-06-10 NOTE — Telephone Encounter (Signed)
Medicaid has approved the request for coverage on Vimpat effective until 06/07/2016 Ref # [id number].  I called the pharmacy and they verified the Rx did go through ins without any problems.  I called Ms [name] back.  Got no answer.  Left message.

## 2015-06-23 ENCOUNTER — Ambulatory Visit (HOSPITAL_COMMUNITY): Payer: Self-pay | Admitting: Psychiatry

## 2015-06-24 DIAGNOSIS — R109 Unspecified abdominal pain: Secondary | ICD-10-CM | POA: Insufficient documentation

## 2015-06-27 ENCOUNTER — Ambulatory Visit: Payer: Self-pay | Admitting: Family Medicine

## 2015-06-28 ENCOUNTER — Encounter: Payer: Self-pay | Admitting: Nurse Practitioner

## 2015-06-28 ENCOUNTER — Ambulatory Visit (INDEPENDENT_AMBULATORY_CARE_PROVIDER_SITE_OTHER): Payer: Medicaid Other | Admitting: Nurse Practitioner

## 2015-06-28 ENCOUNTER — Telehealth: Payer: Self-pay

## 2015-06-28 VITALS — BP 107/73 | HR 82 | Ht 71.0 in | Wt 198.0 lb

## 2015-06-28 DIAGNOSIS — R569 Unspecified convulsions: Secondary | ICD-10-CM | POA: Diagnosis not present

## 2015-06-28 DIAGNOSIS — G40909 Epilepsy, unspecified, not intractable, without status epilepticus: Secondary | ICD-10-CM | POA: Diagnosis not present

## 2015-06-28 DIAGNOSIS — F259 Schizoaffective disorder, unspecified: Secondary | ICD-10-CM | POA: Diagnosis not present

## 2015-06-28 DIAGNOSIS — Z5181 Encounter for therapeutic drug level monitoring: Secondary | ICD-10-CM

## 2015-06-28 NOTE — Telephone Encounter (Signed)
Called and spoke to patient's wife . I relayed to her that patient was just seen and did patient need to come in for Visit today. Patient wife states she still wants to bring her husband in to be seen and she was not  cx apt. Today she want's him looked at.  I relayed to wife that was fine and we will see them today. Patient has not  had a seizure.

## 2015-06-28 NOTE — Patient Instructions (Addendum)
Dilantin level today Continue same dose of Dilantin until labs back Continue Vimpat 150 twice daily Keep follow-up appointment with Dr. [name] Needs more in-home supervision Need to follow-up with psychiatry

## 2015-06-28 NOTE — Progress Notes (Signed)
[facility]  PATIENT: [name] DOB: [birth year]   REASON FOR VISIT: Follow-up for seizure disorder, schizoaffective disorder, long history of depression HISTORY FROM: Patient and significant other [name]    HISTORY OF PRESENT ILLNESS:[name] is a 59 year old left-handed white male with a history of intractable seizures. The patient has recently been in the hospital on 06/01/2015 with increased seizure frequency. The patient had about 3 seizures just prior to admission, and had one seizure in the hospital. He was last seen in the office by Dr. [name] 06/08/2012. At that time a Dilantin level was ordered because his medications have been increased in the hospital from from 200 mg daily to 300 mg daily. The Vimpat was increased from 150 mg twice daily to 200 mg twice daily, however his Vimpat dose currently at 150 twice daily that was confirmed with his drugstore.Since hospitalization  the patient has had some increased problems with forgetfulness, gait instability, and slurred speech. The patient is on Seroquel, he also takes clonazepam taking 2 mg 4 times daily. He takes amitriptyline as well. The patient has not had any overt seizures since hospitalization. He admits to sometimes missing doses of his seizure medication.  The patient may have a staring episode, or he may have some jerking with the seizures. EEG evaluations on 2 occasions were abnormal. MRI of the brain 05/09/2015 was Slightly abnormal MRI scan of the brain showing mild age-related changes of chronic microvascular ischemia and generalized cerebral atrophy. No significant change compared with CT scan brain 02/15/2015 He returns to the office today for an evaluation.   REVIEW OF SYSTEMS: Full 14 system review of systems performed and notable only for those listed, all others are neg:  Constitutional: neg  Cardiovascular: neg Ear/Nose/Throat: neg  Skin: neg Eyes: neg Respiratory: neg Gastroitestinal: neg    Hematology/Lymphatic: neg  Endocrine: neg Musculoskeletal:neg Allergy/Immunology: neg Neurological: neg Psychiatric: neg Sleep : neg   ALLERGIES: Allergies  Allergen Reactions  . Codeine Nausea And Vomiting  . Sulfa Antibiotics Nausea And Vomiting    HOME MEDICATIONS: Outpatient Prescriptions Prior to Visit  Medication Sig Dispense Refill  . amitriptyline (ELAVIL) 150 MG tablet Take 2 tablets (300 mg total) by mouth at bedtime. 60 tablet 3  . aspirin EC 81 MG tablet Take 81 mg by mouth daily.    . clonazePAM (KLONOPIN) 2 MG tablet Take 2 mg by mouth 4 (four) times daily.    . CVS SENNA 8.6 MG tablet TAKE 1 TABLET (8.6 MG TOTAL) BY MOUTH DAILY. 90 tablet 0  . lacosamide (VIMPAT) 200 MG TABS tablet Take 1 tablet (200 mg total) by mouth 2 (two) times daily.    . metoprolol succinate (TOPROL-XL) 50 MG 24 hr tablet TAKE 1 TABLET (50 MG TOTAL) BY MOUTH AT BEDTIME. TAKE WITH OR IMMEDIATELY FOLLOWING A MEAL. 30 tablet 0  . phenytoin (DILANTIN) 100 MG ER capsule Take 3 capsules (300 mg total) by mouth at bedtime. 90 capsule 0  . polyethylene glycol (MIRALAX / GLYCOLAX) packet Take 17 g by mouth 2 (two) times daily as needed. For constipation. Mix into 8 ounces of fluid & drink 100 each 6  . QUEtiapine (SEROQUEL) 300 MG tablet Take 900 mg by mouth at bedtime.  2  . Selenium Sulfide 2.25 % SHAM Apply 1 application topically daily. To the scalp 3 to 6 times per week. 180 mL 0  . senna-docusate (SENOKOT-S) 8.6-50 MG per tablet Take 1 tablet by mouth at bedtime. 30 tablet 0  .  simvastatin (ZOCOR) 40 MG tablet Take 1 tablet (40 mg total) by mouth at bedtime. 30 tablet 2  . testosterone cypionate (DEPOTESTOTERONE CYPIONATE) 200 MG/ML injection Inject 2 mLs (400 mg total) into the muscle every 14 (fourteen) days. 10 mL 0   No facility-administered medications prior to visit.    PAST MEDICAL HISTORY: Past Medical History  Diagnosis Date  . Seizures   . Hypertension   . GERD (gastroesophageal  reflux disease)   . Dyslipidemia   . Schizoaffective disorder   . Depression   . Anxiety   . MRSA carrier   . Constipation   . Memory loss   . Hearing loss     PAST SURGICAL HISTORY: Past Surgical History  Procedure Laterality Date  . Self orchectomy    . Colonoscopy  2010  . Orif shoulder fracture    . Shoulder closed reduction Left 09/16/2013    Procedure: CLOSED REDUCTION SHOULDER;  Surgeon: Mauri Pole, MD;  Location: [facility];  Service: Orthopedics;  Laterality: Left;  . Shoulder hemi-arthroplasty Left 09/18/2013    Procedure: LEFT SHOULDER HEMI-ARTHROPLASTY;  Surgeon: Augustin Schooling, MD;  Location: [facility];  Service: Orthopedics;  Laterality: Left;  . Carpal tunnel release      FAMILY HISTORY: Family History  Problem Relation Age of Onset  . Stroke Father     Living at 58  . Stroke Mother     Stroke in late 4's. Died in her 68's    SOCIAL HISTORY: History   Social History  . Marital Status: Single    Spouse Name: N/A  . Number of Children: 0  . Years of Education: 18   Occupational History  . disabled    Social History Main Topics  . Smoking status: Never Smoker   . Smokeless tobacco: Never Used  . Alcohol Use: No  . Drug Use: No  . Sexual Activity: No   Other Topics Concern  . Not on file   Social History Narrative   Patient does not drink caffeine.   Patient is left handed.     PHYSICAL EXAM  Filed Vitals:   06/28/15 1613  Height: 5\' 11"  (1.803 m)  Weight: 198 lb (89.812 kg)   Body mass index is 27.63 kg/(m^2). General: The patient is alert and cooperative at the time of the examination. Skin: No significant peripheral edema is noted.   Neurologic Exam  Mental status: The patient is alert and oriented x 3 at the time of the examination. The patient has apparent normal recent and remote memory, with an apparently normal attention span and concentration ability. Cranial nerves: Facial symmetry is present. Speech is slightly dysarthric.  Extraocular movements are full. Visual fields are full. Motor: The patient has good strength in all 4 extremities. No focal weakness Sensory examination: Soft touch sensation is symmetric on the face, arms, and legs. Coordination: The patient has good finger-nose-finger and heel-to-shin bilaterally. Gait and station: The patient has a normal gait. Tandem gait is unsteady. Romberg is negative. No drift is seen. No assistive device Reflexes: Deep tendon reflexes are symmetric.   DIAGNOSTIC DATA (LABS, IMAGING, TESTING) - I reviewed patient records, labs, notes, testing and imaging myself where available.  Lab Results  Component Value Date   WBC 6.1 06/02/2015   HGB 13.8 06/02/2015   HCT 39.8 06/02/2015   MCV 91.5 06/02/2015   PLT 152 06/02/2015      Component Value Date/Time   NA 141 06/02/2015 0554   NA 141  04/29/2015 1220   K 3.5 06/02/2015 0554   CL 107 06/02/2015 0554   CO2 26 06/02/2015 0554   GLUCOSE 115* 06/02/2015 0554   GLUCOSE 113* 04/29/2015 1220   BUN 9 06/02/2015 0554   BUN 13 04/29/2015 1220   CREATININE 0.89 06/02/2015 0554   CREATININE 0.94 10/13/2014 1503   CALCIUM 8.7* 06/02/2015 0554   PROT 7.3 06/01/2015 1737   ALBUMIN 4.3 06/01/2015 1737   AST 22 06/01/2015 1737   ALT 21 06/01/2015 1737   ALKPHOS 95 06/01/2015 1737   BILITOT 0.5 06/01/2015 1737   GFRNONAA >60 06/02/2015 0554   GFRAA >60 06/02/2015 0554    Lab Results  Component Value Date   NIDPOEUM35 361 02/16/2015   Lab Results  Component Value Date   TSH 3.882 02/16/2015      ASSESSMENT AND PLAN  59 y.o. year old male  has a past medical history of intractable seizure disorder and schizoaffective disorder. He was hospitalized in June for for seizure events. He has continued to have increased problems with forgetfulness gait instability and slurred speech since that time. He has not had a recent Dilantin level with his  dose change.   Dilantin level today Continue same dose of Dilantin  until labs back Continue Vimpat 150 twice daily confirmed dosage with the drugstore Keep follow-up appointment with Dr. [name] Needs more in-home supervision, need to get a medication planner so that medicines are taken as ordered Need to follow-up with psychiatry Vst time 25 min Dennie Bible, [facility], [facility], [facility] [address], [place], [place] [address] [phone]

## 2015-06-28 NOTE — Progress Notes (Signed)
I have read the note, and I agree with the clinical assessment and plan.  [name]   

## 2015-06-29 ENCOUNTER — Telehealth: Payer: Self-pay | Admitting: Family Medicine

## 2015-06-29 LAB — PHENYTOIN LEVEL, TOTAL: Phenytoin (Dilantin), Serum: 18.6 ug/mL (ref 10.0–20.0)

## 2015-06-29 NOTE — Telephone Encounter (Signed)
Left message for patient to call back  

## 2015-06-29 NOTE — Telephone Encounter (Signed)
FYI to MD

## 2015-06-29 NOTE — Telephone Encounter (Signed)
Pt called to inform the doctor that he has had his testosterone shot and they will be getting a call to do blood work. jw

## 2015-06-29 NOTE — Telephone Encounter (Signed)
Patient has had his second testosterone injection yesterday. Wants to know when MD want him to come in and have labs.

## 2015-06-30 NOTE — Telephone Encounter (Signed)
Attempted to return call to patient but one phone number is disconnected, and no one answered at the second. Did not leave VM.   Once patient has been taking the same dose of testosterone consistently for TWO MONTHS (dosed every other week), he should come in for an office visit and lab draw. The visit should be timed ONE WEEK after an injection.   He needs to bring his testosterone vial with him to that appointment so we can confirm the dose he has been taking.  Will try again later to reach patient.  Leeanne Rio, MD

## 2015-06-30 NOTE — Telephone Encounter (Signed)
Attempted again to reach patient, but no answer to either line. Leeanne Rio, MD

## 2015-07-01 ENCOUNTER — Telehealth: Payer: Self-pay | Admitting: Nurse Practitioner

## 2015-07-01 NOTE — Telephone Encounter (Signed)
Call significant other Dilantin level 18.6. Continue same dose of Dilantin. Keep appointment with behavioral health August 5 11:00

## 2015-07-06 ENCOUNTER — Encounter: Payer: Self-pay | Admitting: Family Medicine

## 2015-07-06 ENCOUNTER — Other Ambulatory Visit: Payer: Self-pay | Admitting: Family Medicine

## 2015-07-06 NOTE — Telephone Encounter (Signed)
Again tried to reach patient but no answer to either line. Will send letter advising timing of testosterone testing.  Leeanne Rio, MD

## 2015-07-12 ENCOUNTER — Encounter (HOSPITAL_COMMUNITY): Payer: Self-pay | Admitting: Emergency Medicine

## 2015-07-12 ENCOUNTER — Emergency Department (HOSPITAL_COMMUNITY)
Admission: EM | Admit: 2015-07-12 | Discharge: 2015-07-13 | Disposition: A | Discharge status: Other Acute Inpatient Hospital | Payer: Medicaid Other | Attending: Emergency Medicine | Admitting: Emergency Medicine

## 2015-07-12 DIAGNOSIS — I10 Essential (primary) hypertension: Secondary | ICD-10-CM | POA: Insufficient documentation

## 2015-07-12 DIAGNOSIS — Z87891 Personal history of nicotine dependence: Secondary | ICD-10-CM | POA: Diagnosis not present

## 2015-07-12 DIAGNOSIS — Z7982 Long term (current) use of aspirin: Secondary | ICD-10-CM | POA: Diagnosis not present

## 2015-07-12 DIAGNOSIS — E785 Hyperlipidemia, unspecified: Secondary | ICD-10-CM | POA: Diagnosis not present

## 2015-07-12 DIAGNOSIS — Z79899 Other long term (current) drug therapy: Secondary | ICD-10-CM | POA: Diagnosis not present

## 2015-07-12 DIAGNOSIS — Z008 Encounter for other general examination: Secondary | ICD-10-CM | POA: Diagnosis not present

## 2015-07-12 DIAGNOSIS — F1310 Sedative, hypnotic or anxiolytic abuse, uncomplicated: Secondary | ICD-10-CM | POA: Diagnosis not present

## 2015-07-12 DIAGNOSIS — Z8614 Personal history of Methicillin resistant Staphylococcus aureus infection: Secondary | ICD-10-CM | POA: Insufficient documentation

## 2015-07-12 DIAGNOSIS — H9190 Unspecified hearing loss, unspecified ear: Secondary | ICD-10-CM | POA: Insufficient documentation

## 2015-07-12 DIAGNOSIS — F419 Anxiety disorder, unspecified: Secondary | ICD-10-CM | POA: Diagnosis not present

## 2015-07-12 DIAGNOSIS — F329 Major depressive disorder, single episode, unspecified: Secondary | ICD-10-CM | POA: Diagnosis not present

## 2015-07-12 LAB — CBC
HCT: 42.5 % (ref 39.0–52.0)
Hemoglobin: 14.8 g/dL (ref 13.0–17.0)
MCH: 32.7 pg (ref 26.0–34.0)
MCHC: 34.8 g/dL (ref 30.0–36.0)
MCV: 94 fL (ref 78.0–100.0)
Platelets: 226 10*3/uL (ref 150–400)
RBC: 4.52 MIL/uL (ref 4.22–5.81)
RDW: 14.5 % (ref 11.5–15.5)
WBC: 8.3 10*3/uL (ref 4.0–10.5)

## 2015-07-12 LAB — COMPREHENSIVE METABOLIC PANEL
ALT: 23 U/L (ref 17–63)
AST: 33 U/L (ref 15–41)
Albumin: 4.3 g/dL (ref 3.5–5.0)
Alkaline Phosphatase: 86 U/L (ref 38–126)
Anion gap: 8 (ref 5–15)
BUN: 18 mg/dL (ref 6–20)
CO2: 25 mmol/L (ref 22–32)
Calcium: 8.8 mg/dL — ABNORMAL LOW (ref 8.9–10.3)
Chloride: 106 mmol/L (ref 101–111)
Creatinine, Ser: 1.23 mg/dL (ref 0.61–1.24)
GFR calc Af Amer: >60 mL/min (ref >60)
GFR calc non Af Amer: >60 mL/min (ref >60)
Glucose, Bld: 122 mg/dL — ABNORMAL HIGH (ref 65–99)
Potassium: 4.2 mmol/L (ref 3.5–5.1)
Sodium: 139 mmol/L (ref 135–145)
Total Bilirubin: 0.7 mg/dL (ref 0.3–1.2)
Total Protein: 7.5 g/dL (ref 6.5–8.1)

## 2015-07-12 LAB — SALICYLATE LEVEL: Salicylate Lvl: <4 mg/dL (ref 2.8–30.0)

## 2015-07-12 LAB — ETHANOL: Alcohol, Ethyl (B): <5 mg/dL (ref <5)

## 2015-07-12 LAB — ACETAMINOPHEN LEVEL: Acetaminophen (Tylenol), Serum: <10 ug/mL — ABNORMAL LOW (ref 10–30)

## 2015-07-12 NOTE — ED Notes (Signed)
Pt was seen at [facility] and was sent here for medical clearance  Pt's friend states they were told he has a bed ready for him  Pt is rambling in conversation

## 2015-07-12 NOTE — Progress Notes (Addendum)
Writer and [facility] Tori at [facility] spoke with Security at [facility] and inquired if patient's brown lunch box and wallet were at [facility]. Security states he did not see patient coming in with lunch box and that patient didn't make it past the lobby.  This Probation officer and [place] looked for patient's lunch box and wallet in the lobby but nothing was found.  Verlon Setting, [facility] staff 07/12/2015 10:53 PM

## 2015-07-12 NOTE — ED Notes (Signed)
Pt states he believes he left his wallet and other valuables at [facility] during his intake. Called over to [facility], call back number given.

## 2015-07-13 ENCOUNTER — Encounter (HOSPITAL_COMMUNITY): Payer: Self-pay

## 2015-07-13 ENCOUNTER — Inpatient Hospital Stay (HOSPITAL_COMMUNITY)
Admission: AD | Admit: 2015-07-13 | Discharge: 2015-07-21 | DRG: 885 | Disposition: A | Discharge status: Home / Self Care | Payer: Medicaid Other | Source: Intra-hospital | Attending: Psychiatry | Admitting: Psychiatry

## 2015-07-13 DIAGNOSIS — I10 Essential (primary) hypertension: Secondary | ICD-10-CM | POA: Diagnosis present

## 2015-07-13 DIAGNOSIS — H9190 Unspecified hearing loss, unspecified ear: Secondary | ICD-10-CM | POA: Diagnosis present

## 2015-07-13 DIAGNOSIS — E785 Hyperlipidemia, unspecified: Secondary | ICD-10-CM | POA: Diagnosis present

## 2015-07-13 DIAGNOSIS — F410 Panic disorder [episodic paroxysmal anxiety] without agoraphobia: Secondary | ICD-10-CM | POA: Diagnosis present

## 2015-07-13 DIAGNOSIS — Z008 Encounter for other general examination: Secondary | ICD-10-CM | POA: Diagnosis not present

## 2015-07-13 DIAGNOSIS — G4700 Insomnia, unspecified: Secondary | ICD-10-CM | POA: Diagnosis present

## 2015-07-13 DIAGNOSIS — Z823 Family history of stroke: Secondary | ICD-10-CM

## 2015-07-13 DIAGNOSIS — F42 Obsessive-compulsive disorder: Secondary | ICD-10-CM | POA: Diagnosis present

## 2015-07-13 DIAGNOSIS — K5900 Constipation, unspecified: Secondary | ICD-10-CM | POA: Diagnosis present

## 2015-07-13 DIAGNOSIS — R45851 Suicidal ideations: Secondary | ICD-10-CM | POA: Diagnosis present

## 2015-07-13 DIAGNOSIS — F251 Schizoaffective disorder, depressive type: Principal | ICD-10-CM | POA: Diagnosis present

## 2015-07-13 DIAGNOSIS — R569 Unspecified convulsions: Secondary | ICD-10-CM

## 2015-07-13 LAB — LIPID PANEL
Cholesterol: 175 mg/dL (ref 0–200)
HDL: 62 mg/dL (ref >40)
LDL Cholesterol: 82 mg/dL (ref 0–99)
Total CHOL/HDL Ratio: 2.8 RATIO
Triglycerides: 154 mg/dL — ABNORMAL HIGH (ref <150)
VLDL: 31 mg/dL (ref 0–40)

## 2015-07-13 LAB — RAPID URINE DRUG SCREEN, HOSP PERFORMED
Amphetamines: NOT DETECTED
Barbiturates: NOT DETECTED
Benzodiazepines: POSITIVE — AB
Cocaine: NOT DETECTED
Opiates: NOT DETECTED
Tetrahydrocannabinol: NOT DETECTED

## 2015-07-13 LAB — TSH: TSH: 2.644 u[IU]/mL (ref 0.350–4.500)

## 2015-07-13 MED ORDER — LACOSAMIDE 50 MG PO TABS
150.0000 mg | ORAL_TABLET | Freq: Two times a day (BID) | ORAL | Status: DC
  Administered 2015-07-13 – 2015-07-21 (×17): 150 mg via ORAL
  Filled 2015-07-13 (×17): qty 3

## 2015-07-13 MED ORDER — QUETIAPINE FUMARATE 300 MG PO TABS
900.0000 mg | ORAL_TABLET | Freq: Every day | ORAL | Status: DC
Start: 2015-07-13 — End: 2015-07-16
  Administered 2015-07-13 – 2015-07-15 (×3): 900 mg via ORAL
  Filled 2015-07-13 (×4): qty 3

## 2015-07-13 MED ORDER — ALUM & MAG HYDROXIDE-SIMETH 200-200-20 MG/5ML PO SUSP: 30.0000 mL | ORAL | Status: DC | PRN

## 2015-07-13 MED ORDER — CLONAZEPAM 1 MG PO TABS
2.0000 mg | ORAL_TABLET | Freq: Four times a day (QID) | ORAL | Status: DC
  Administered 2015-07-13 – 2015-07-16 (×13): 2 mg via ORAL
  Filled 2015-07-13 (×15): qty 2

## 2015-07-13 MED ORDER — SENNOSIDES-DOCUSATE SODIUM 8.6-50 MG PO TABS
1.0000 | ORAL_TABLET | Freq: Every day | ORAL | Status: DC
  Administered 2015-07-13 – 2015-07-20 (×8): 1 via ORAL
  Filled 2015-07-13 (×10): qty 1

## 2015-07-13 MED ORDER — AMITRIPTYLINE HCL 100 MG PO TABS
300.0000 mg | ORAL_TABLET | Freq: Every day | ORAL | Status: DC
  Administered 2015-07-13 – 2015-07-14 (×2): 300 mg via ORAL
  Filled 2015-07-13 (×3): qty 3

## 2015-07-13 MED ORDER — TESTOSTERONE CYPIONATE 200 MG/ML IM SOLN
400.0000 mg | INTRAMUSCULAR | Status: DC
  Administered 2015-07-13: 400 mg via INTRAMUSCULAR
  Filled 2015-07-13: qty 2

## 2015-07-13 MED ORDER — ACETAMINOPHEN 325 MG PO TABS
650.0000 mg | ORAL_TABLET | Freq: Four times a day (QID) | ORAL | Status: DC | PRN
  Administered 2015-07-14 – 2015-07-15 (×2): 650 mg via ORAL
  Filled 2015-07-13 (×2): qty 2

## 2015-07-13 MED ORDER — MAGNESIUM HYDROXIDE 400 MG/5ML PO SUSP
30.0000 mL | Freq: Every day | ORAL | Status: DC | PRN
  Administered 2015-07-14: 30 mL via ORAL
  Filled 2015-07-13: qty 30

## 2015-07-13 MED ORDER — SIMVASTATIN 40 MG PO TABS
40.0000 mg | ORAL_TABLET | Freq: Every day | ORAL | Status: DC
  Administered 2015-07-13 – 2015-07-20 (×8): 40 mg via ORAL
  Filled 2015-07-13: qty 1
  Filled 2015-07-13: qty 2
  Filled 2015-07-13 (×9): qty 1

## 2015-07-13 MED ORDER — MAGNESIUM CITRATE PO SOLN
1.0000 | Freq: Once | ORAL | Status: AC
  Administered 2015-07-15: 1 via ORAL

## 2015-07-13 MED ORDER — POLYETHYLENE GLYCOL 3350 17 G PO PACK: 17.0000 g | PACK | Freq: Every day | ORAL | Status: DC | PRN

## 2015-07-13 MED ORDER — POLYETHYLENE GLYCOL 3350 17 G PO PACK
17.0000 g | PACK | Freq: Two times a day (BID) | ORAL | Status: DC
  Administered 2015-07-13 – 2015-07-21 (×16): 17 g via ORAL
  Filled 2015-07-13 (×21): qty 1

## 2015-07-13 MED ORDER — PHENYTOIN SODIUM EXTENDED 100 MG PO CAPS
300.0000 mg | ORAL_CAPSULE | Freq: Every day | ORAL | Status: DC
  Administered 2015-07-13 – 2015-07-20 (×8): 300 mg via ORAL
  Filled 2015-07-13 (×9): qty 3
  Filled 2015-07-13: qty 15

## 2015-07-13 MED ORDER — ASPIRIN EC 81 MG PO TBEC
81.0000 mg | DELAYED_RELEASE_TABLET | Freq: Every day | ORAL | Status: DC
  Administered 2015-07-13 – 2015-07-21 (×9): 81 mg via ORAL
  Filled 2015-07-13 (×14): qty 1

## 2015-07-13 MED ORDER — METOPROLOL SUCCINATE ER 50 MG PO TB24
50.0000 mg | ORAL_TABLET | Freq: Every day | ORAL | Status: DC
  Administered 2015-07-13 – 2015-07-21 (×8): 50 mg via ORAL
  Filled 2015-07-13 (×4): qty 1
  Filled 2015-07-13: qty 5
  Filled 2015-07-13 (×9): qty 1

## 2015-07-13 MED ORDER — HYDROXYZINE HCL 25 MG PO TABS
25.0000 mg | ORAL_TABLET | Freq: Four times a day (QID) | ORAL | Status: DC | PRN
  Administered 2015-07-13: 25 mg via ORAL
  Filled 2015-07-13: qty 1

## 2015-07-13 NOTE — ED Notes (Signed)
Pts girlfried states she has the pts wallet, and does not believe he had the bag of valuables with him tonight.

## 2015-07-13 NOTE — ED Notes (Signed)
TTS at bedside. 

## 2015-07-13 NOTE — ED Notes (Signed)
Marcus, TTS, at bedside.
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
## 2015-07-13 NOTE — ED Provider Notes (Signed)
CSN: [id number]     Arrival date & time 07/12/15  2144 History   First MD Initiated Contact with Patient 07/12/15 2311     Chief Complaint  Patient presents with  . Medical Clearance     (Consider location/radiation/quality/duration/timing/severity/associated sxs/prior Treatment) The history is provided by the patient and medical records.    59 year old male with history of seizures, hypertension, GERD, hyperlipidemia, schizoaffective disorder, depression, anxiety , memory loss , presenting to the ED for medical clearance. Patient was seen at behavioral health earlier this evening for stress an suicidal ideation. Patient states he has a lot of personal stress in his life which has culminated over the past several months. He has planned to suffocate himself with helium. He did purchase a helium tank, tubing, and facemask along with tape to adhere mask to his face.  He states he does not feel that he would actually carry through with these actions, however he states he has been on "standby" in case things did get worse. He denies any homicidal ideation. He denies any auditory or visual hallucinations.   Patient denies any recent alcohol or illicit drug use.   Patient has no current physical complaints at this time.  VSS.  Past Medical History  Diagnosis Date  . Seizures   . Hypertension   . GERD (gastroesophageal reflux disease)   . Dyslipidemia   . Schizoaffective disorder   . Depression   . Anxiety   . MRSA carrier   . Constipation   . Memory loss   . Hearing loss    Past Surgical History  Procedure Laterality Date  . Self orchectomy    . Colonoscopy  2010  . Orif shoulder fracture    . Shoulder closed reduction Left 09/16/2013    Procedure: CLOSED REDUCTION SHOULDER;  Surgeon: Mauri Pole, MD;  Location: [facility];  Service: Orthopedics;  Laterality: Left;  . Shoulder hemi-arthroplasty Left 09/18/2013    Procedure: LEFT SHOULDER HEMI-ARTHROPLASTY;  Surgeon: Augustin Schooling, MD;   Location: [facility];  Service: Orthopedics;  Laterality: Left;  . Carpal tunnel release     Family History  Problem Relation Age of Onset  . Stroke Father     Living at 63  . Stroke Mother     Stroke in late 27's. Died in her 44's   History  Substance Use Topics  . Smoking status: Never Smoker   . Smokeless tobacco: Never Used  . Alcohol Use: No    Review of Systems  Psychiatric/Behavioral:       Medical clearance  All other systems reviewed and are negative.     Allergies  Codeine and Sulfa antibiotics  Home Medications   Prior to Admission medications   Medication Sig Start Date End Date Taking? Authorizing Provider  amitriptyline (ELAVIL) 150 MG tablet Take 2 tablets (300 mg total) by mouth at bedtime. 11/29/14  Yes Frazier Richards, MD  aspirin EC 81 MG tablet Take 81 mg by mouth daily.   Yes Historical Provider, MD  clonazePAM (KLONOPIN) 2 MG tablet Take 2 mg by mouth 4 (four) times daily.   Yes Historical Provider, MD  Lacosamide (VIMPAT) 150 MG TABS Take 150 mg by mouth 2 (two) times daily.   Yes Historical Provider, MD  metoprolol succinate (TOPROL-XL) 50 MG 24 hr tablet TAKE 1 TABLET (50 MG TOTAL) BY MOUTH AT BEDTIME. TAKE WITH OR IMMEDIATELY FOLLOWING A MEAL. 06/06/15  Yes Olin Hauser, DO  phenytoin (DILANTIN) 100 MG ER capsule  TAKE 3 CAPSULES (300 MG TOTAL) BY MOUTH AT BEDTIME. 07/07/15  Yes Leeanne Rio, MD  polyethylene glycol [facility] / GLYCOLAX) packet Take 17 g by mouth 2 (two) times daily as needed. For constipation. Mix into 8 ounces of fluid & drink 12/31/14  Yes Leeanne Rio, MD  QUEtiapine (SEROQUEL) 300 MG tablet Take 900 mg by mouth at bedtime. 03/28/15  Yes Historical Provider, MD  Selenium Sulfide 2.25 % SHAM Apply 1 application topically daily. To the scalp 3 to 6 times per week. 05/17/15  Yes Archie Patten, MD  senna-docusate (SENOKOT-S) 8.6-50 MG per tablet Take 1 tablet by mouth at bedtime. 06/03/15  Yes Virginia Crews, MD   simvastatin (ZOCOR) 40 MG tablet Take 1 tablet (40 mg total) by mouth at bedtime. 06/06/15  Yes Alexander Devin Going, DO  testosterone cypionate (DEPOTESTOTERONE CYPIONATE) 200 MG/ML injection Inject 2 mLs (400 mg total) into the muscle every 14 (fourteen) days. 03/08/15  Yes Leeanne Rio, MD  [employer] SENNA 8.6 MG tablet TAKE 1 TABLET (8.6 MG TOTAL) BY MOUTH DAILY. Patient not taking: Reported on 07/12/2015 05/06/15   Leeanne Rio, MD   BP 124/81 mmHg  Pulse 83  Temp(Src) 99.3 F (37.4 C) (Oral)  Resp 20  SpO2 96%   Physical Exam  Constitutional: He is oriented to person, place, and time. He appears well-developed and well-nourished. No distress.  HENT:  Head: Normocephalic and atraumatic.  Mouth/Throat: Oropharynx is clear and moist.  Eyes: Conjunctivae and EOM are normal. Pupils are equal, round, and reactive to light.  Neck: Normal range of motion. Neck supple.  Cardiovascular: Normal rate, regular rhythm and normal heart sounds.   Pulmonary/Chest: Effort normal and breath sounds normal. No respiratory distress. He has no wheezes.  Abdominal: Soft. Bowel sounds are normal. There is no tenderness. There is no guarding.  Musculoskeletal: Normal range of motion.  Neurological: He is alert and oriented to person, place, and time.  Skin: Skin is warm and dry. He is not diaphoretic.  Psychiatric: He has a normal mood and affect. He is not actively hallucinating. He expresses suicidal ideation. He expresses no homicidal ideation. He expresses suicidal plans. He expresses no homicidal plans.  Nursing note and vitals reviewed.   ED Course  Procedures (including critical care time) Labs Review Labs Reviewed  COMPREHENSIVE METABOLIC PANEL - Abnormal; Notable for the following:    Glucose, Bld 122 (*)    Calcium 8.8 (*)    All other components within normal limits  ACETAMINOPHEN LEVEL - Abnormal; Notable for the following:    Acetaminophen (Tylenol), Serum <10 (*)    All  other components within normal limits  ETHANOL  SALICYLATE LEVEL  CBC  URINE RAPID DRUG SCREEN, HOSP PERFORMED    Imaging Review No results found.   EKG Interpretation None      MDM   Final diagnoses:  Medical clearance for psychiatric admission   59 year old male here requesting medical clearance. Patient was seen at behavioral health earlier this evening and apparently has a bed waiting for him once medically cleared. He does express some suicidal ideation with plan to suffocate himself with helium. He  Denies any homicidal ideation. No auditory or visual hallucinations. Majority of lab work resulted, no concerning findings. UDS pending.  Report to be called back to [facility] once UDS resulted.  Anticipate transfer back to [facility] for IP treatment.  Larene Pickett, PA-C 07/13/15 0111  Linton Flemings, MD 07/13/15 [phone]

## 2015-07-13 NOTE — ED Notes (Signed)
Marcus from assessment team reports that he is going to come and see patient before being transferred back to [facility].

## 2015-07-13 NOTE — Tx Team (Addendum)
Initial Interdisciplinary Treatment Plan   PATIENT STRESSORS: Health problems Medication change or noncompliance   PATIENT STRENGTHS: Active sense of humor General fund of knowledge Supportive family/friends   PROBLEM LIST: Problem List/Patient Goals Date to be addressed Date deferred Reason deferred Estimated date of resolution  I want help with my depression and anxiety      depression      anxiety      Suicidal ideation                                     DISCHARGE CRITERIA:  Ability to meet basic life and health needs Improved stabilization in mood, thinking, and/or behavior Verbal commitment to aftercare and medication compliance  PRELIMINARY DISCHARGE PLAN: Attend aftercare/continuing care group Return to previous living arrangement  PATIENT/FAMIILY INVOLVEMENT: This treatment plan has been presented to and reviewed with the patient, [name], and/or family member, .  The patient and family have been given the opportunity to ask questions and make suggestions.  [name] 07/13/2015, 5:48 AM

## 2015-07-13 NOTE — ED Notes (Signed)
Gwen, [place], notified of patient's need for transport.
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
## 2015-07-13 NOTE — ED Notes (Signed)
Pt instructed of need for urine sample. Pt urinated in the floor, reported he thought he was urinating in the urinal.

## 2015-07-13 NOTE — Progress Notes (Signed)
D: Patient behavior bizarre. "I want to tour the [place] book store with you." Delusional. He denies SI/HI. Denies physical pain. Slow to respond on approach. Loose associations. Noted pacing the hall. Reports he ate a big breakfast, noted socializing with peers on the unit.  A:Special checks q 15 mins in place for safety. Medication administered per MD order (see eMAR). Encouragement and support provided.   R:Safety maintained. Compliant with medication regimen. Will continue to monitor.

## 2015-07-13 NOTE — Plan of Care (Signed)
Problem: Ineffective individual coping Goal: STG: Patient will remain free from self harm Outcome: Progressing Pt has remained free from self harm     

## 2015-07-13 NOTE — Plan of Care (Signed)
Problem: Alteration in mood Goal: LTG-Patient reports reduction in suicidal thoughts (Patient reports reduction in suicidal thoughts and is able to verbalize a safety plan for whenever patient is feeling suicidal)  Outcome: Progressing Patient denies suicidal thoughts this shift.

## 2015-07-13 NOTE — [facility] Suicide Risk Assessment (Signed)
[name] Admission Suicide Risk Assessment   Nursing information obtained from:    Demographic factors:    Current Mental Status:    Loss Factors:    Historical Factors:    Risk Reduction Factors:    Total Time spent with patient: 45 minutes Principal Problem: Schizoaffective disorder, depressive type with good prognostic features Diagnosis:   Patient Active Problem List   Diagnosis Date Noted  . Schizoaffective disorder, depressive type with good prognostic features [F25.1] 07/13/2015  . Abdominal pain [R10.9]   . Constipation [K59.00]   . Verruca [B07.9] 05/18/2015  . Seborrheic dermatitis of scalp [L21.8] 05/18/2015  . Blood poisoning [A41.9]   . MDD (major depressive disorder), recurrent severe, without psychosis [F33.2]   . OCD (obsessive compulsive disorder) [F42]   . Confusion [R41.0] 12/31/2014  . Seizures [R56.9]   . Schizoaffective disorder, depressive type [F25.1]   . Orthostatic hypotension [I95.1]   . Hyperlipidemia [E78.5]   . Falls [phone]   . HLD (hyperlipidemia) [E78.5]   . Seizure [R56.9] 11/18/2014  . Schizoaffective disorder, unspecified type [F25.9]   . Essential hypertension [I10]   . Overdose of beta-adrenergic antagonist drug [T44.7X1A] 10/22/2014  . Altered mental status [R41.82]   . Overdose [T50.901A]   . Nocturnal enuresis [N39.44] 03/23/2014  . Benzodiazepine dependence, continuous [F13.20] 01/27/2014  . Opiate dependence [F11.20] 01/22/2014  . Severe major depression [F32.2] 01/21/2014  . Frozen shoulder syndrome [M75.00] 09/20/2013  . Somnolence [R40.0] 09/20/2013  . Schizoaffective disorder [F25.9] 09/20/2013  . Malaise and fatigue [R53.81, R53.83] 04/21/2013  . Presbyopia [H52.4] 04/25/2012  . Androgen deficiency [E29.1] 09/07/2011  . Constipation, chronic [K59.00] 09/07/2011  . Other specified disorders of liver [K76.89] 12/30/2008  . Overweight(278.02) [E66.3] 09/08/2008  . HYPERLIPIDEMIA [E78.5] 01/28/2008  . HYPERTENSION [I10]  01/28/2008  . Seizure disorder [G40.909] 01/28/2008  . OSTEOPENIA [M89.9, M94.9] 07/01/2007     Continued Clinical Symptoms:  Alcohol Use Disorder Identification Test Final Score (AUDIT): 0 The "Alcohol Use Disorders Identification Test", Guidelines for Use in Primary Care, Second Edition.  World Pharmacologist [facility]). Score between 0-7:  no or low risk or alcohol related problems. Score between 8-15:  moderate risk of alcohol related problems. Score between 16-19:  high risk of alcohol related problems. Score 20 or above:  warrants further diagnostic evaluation for alcohol dependence and treatment.   CLINICAL FACTORS:   Bipolar Disorder:   Depressive phase Schizophrenia:   Depressive state   Musculoskeletal: Strength & Muscle Tone: within normal limits Gait & Station: normal Patient leans: normal  Psychiatric Specialty Exam: Physical Exam  Review of Systems  Constitutional: Positive for malaise/fatigue.  HENT: Negative.   Eyes: Negative.   Respiratory: Negative.   Cardiovascular: Negative.   Gastrointestinal: Negative.   Genitourinary: Negative.   Musculoskeletal: Negative.   Skin: Negative.   Neurological: Positive for weakness.  Endo/Heme/Allergies: Negative.   Psychiatric/Behavioral: Positive for depression. The patient is nervous/anxious and has insomnia.     Blood pressure 114/78, pulse 79, temperature 98 F (36.7 C), temperature source Oral, resp. rate 17, height 5\' 11"  (1.803 m), weight 89.812 kg (198 lb).Body mass index is 27.63 kg/(m^2).  General Appearance: Fairly Groomed  Engineer, water::  Fair  Speech:  Clear and Coherent  Volume:  Decreased  Mood:  Anxious, Depressed and worried  Affect:  anxious worried  Thought Process:  Coherent and Goal Directed  Orientation:  Full (Time, Place, and Person)  Thought Content:  worries concerns events ruminations  Suicidal Thoughts:  states he is  not having suicidal ideas  Homicidal Thoughts:  No  Memory:   Immediate;   Fair Recent;   Fair Remote;   Fair  Judgement:  Fair  Insight:  Lacking  Psychomotor Activity:  Normal  Concentration:  Fair  Recall:  [employer] [place]  Language: Fair  Akathisia:  No  Handed:  Right  AIMS (if indicated):     Assets:  Desire for Improvement Housing  Sleep:     Cognition: WNL  ADL's:  Intact     COGNITIVE FEATURES THAT CONTRIBUTE TO RISK:  Closed-mindedness, Polarized thinking and Thought constriction (tunnel vision)    SUICIDE RISK:   Moderate:  Frequent suicidal ideation with limited intensity, and duration, some specificity in terms of plans, no associated intent, good self-control, limited dysphoria/symptomatology, some risk factors present, and identifiable protective factors, including available and accessible social support. 59 Y/O male with history of Schizoaffective Disorder who states that he has been very depressed. States he stays in bed, does not go out. Endorses having no energy no motivation to do anything. States he got the helium not to kill himself now but to be ready when his time comes. He states that he has been very sensitive to medications. He states he cant take SSRI's as thy agitate him. He has done better on the Elavil. He also states that he has had multiple trials with antipsychotics and some of them produce the same effect ;agitation. States he used to see Dr. Reece Levy but he missed "only" two appointments and they terminated him. So he has to find another psychiatrist.  PLAN OF CARE: Supportive approach/coping skills                               Depression ; will continue the Elavil and optimize response                               Psychosis; will pursue the Seroquel and optimize response                               Will work to improve reality testing                               Will order a EKG to assess his QT interval Medical Decision Making:  Review of Psycho-Social Stressors (1), Review of Medication  Regimen & Side Effects (2) and Review of New Medication or Change in Dosage (2)  I certify that inpatient services furnished can reasonably be expected to improve the patient's condition.   [name] 07/13/2015, 8:09 PM
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
## 2015-07-13 NOTE — [facility] Group Notes (Signed)
[facility] LCSW Aftercare Discharge Planning Group Note   07/13/2015 2:52 PM  Participation Quality:  Invited.  In bed, asleep.    [name]

## 2015-07-13 NOTE — [facility] Group Notes (Signed)
[name]  07/13/2015 2:53 PM   Type of [name]:  [name]  Participation Level: Came late, stayed briefly, left and did not return.  Summary of Progress/Problems: Today's group focused on relapse prevention.  We defined the term, and then brainstormed on ways to prevent relapse.  [name] 07/13/2015 , 2:53 PM

## 2015-07-14 DIAGNOSIS — F251 Schizoaffective disorder, depressive type: Principal | ICD-10-CM

## 2015-07-14 LAB — TESTOSTERONE, FREE: Testosterone, Free: 1.9 pg/mL — ABNORMAL LOW (ref 6.6–18.1)

## 2015-07-14 LAB — PHENYTOIN LEVEL, FREE AND TOTAL
Phenytoin, Free: 1.3 ug/mL (ref 1.0–2.0)
Phenytoin, Total: 20.4 ug/mL — ABNORMAL HIGH (ref 10.0–20.0)

## 2015-07-14 LAB — PROLACTIN: Prolactin: 12 ng/mL (ref 4.0–15.2)

## 2015-07-14 LAB — GLUCOSE, CAPILLARY: Glucose-Capillary: 99 mg/dL (ref 65–99)

## 2015-07-14 LAB — TESTOSTERONE: Testosterone: 49 ng/dL — ABNORMAL LOW (ref 348–1197)

## 2015-07-14 LAB — SEX HORMONE BINDING GLOBULIN: Sex Hormone Binding: 47.9 nmol/L (ref 19.3–76.4)

## 2015-07-14 LAB — HEMOGLOBIN A1C
Hgb A1c MFr Bld: 5.9 % — ABNORMAL HIGH (ref 4.8–5.6)
Mean Plasma Glucose: 123 mg/dL

## 2015-07-14 LAB — CK: Total CK: 196 U/L (ref 49–397)

## 2015-07-14 MED ORDER — HYDROXYZINE HCL 50 MG PO TABS
50.0000 mg | ORAL_TABLET | Freq: Four times a day (QID) | ORAL | Status: DC | PRN
  Administered 2015-07-15 – 2015-07-20 (×4): 50 mg via ORAL
  Filled 2015-07-14 (×4): qty 1

## 2015-07-14 NOTE — Progress Notes (Signed)
[facility] MD Progress Note  07/14/2015 4:00 PM [name]  MRN:  [id number] Subjective:  Today, 07/14/15, pt seen and chart reviewed. Pt reports that he is very anxious and cannot stop himself from pacing down the hallways and that he has a blister on his left foot. Pt is visibly anxious and tangential, although appears to have improved from yesterday. Pt denies suicidal/homicidal ideation and psychosis and does not appear to be responding to internal stimuli. Pt is on heavy doses of medications and is known to have a history of memory problems which he reported in the last admission in February of 2015. We will monitor closely for worsening of those symptoms as well. Pt was counseled about his pacing and that he needs to slow down and rest so that he does not worsen the blister on his foot. Nursing staff has applied a gauze pad and it is not yet appropriate to lance due to unclear margins of fluid accumulation with potential to reabsorb if pt is compliant with rest recommendations.   Interval History: 07/13/15: [name] is a 59 yr old who states he is here for depression.  "What it was I transcended duality. The state of consciousness. I believed that is heaven. In other religions, Nirvana sumpati. It happened once in the 9th grade. i believe the religion to be the same. I wasn't planning on using the heluim. It could be a year from now I might could use it. It was not an Artist. It was in the 11th grade. I was trying to get ready with the helium and the mask. I was not going to kill myself. I just needed it ready. I believe my brother was trying to kill me.   A few minutes later in the course of the interview, patient became more lucid. He reported being constipated. I normally take Miralax twice a day. He reported that he has had gaps in memory. He feels lonely not being able to make friends, He has attempted to join groups to be more social. At times, he has so much in his  mind. 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 He would not know which part of the room to start. He also said that when he was in college, he would get notes from his fellow student because he would be to nervous to go inside a big classroom. He also describes himself that he would forget words and would make it impossible to keep a thread of thought and conversation.   Principal Problem: Schizoaffective disorder, depressive type with good prognostic features Diagnosis:   Patient Active Problem List   Diagnosis Date Noted  . Opiate dependence [F11.20] 01/22/2014    Priority: High  . Severe major depression [F32.2] 01/21/2014    Priority: High  . Schizoaffective disorder, depressive type with good prognostic features [F25.1] 07/13/2015  . Abdominal pain [R10.9]   . Constipation [K59.00]   . Verruca [B07.9] 05/18/2015  . Seborrheic dermatitis of scalp [L21.8] 05/18/2015  . Blood poisoning [A41.9]   . MDD (major depressive disorder), recurrent severe, without psychosis [F33.2]   . OCD (obsessive compulsive disorder) [F42]   . Confusion [R41.0] 12/31/2014  . Seizures [R56.9]   . Schizoaffective disorder, depressive type [F25.1]   . Orthostatic hypotension [I95.1]   . Hyperlipidemia [E78.5]   . Falls [phone].[phone]]   . HLD (hyperlipidemia) [E78.5]   . Seizure [R56.9] 11/18/2014  . Schizoaffective disorder, unspecified type [F25.9]   . Essential  hypertension [I10]   . Overdose of beta-adrenergic antagonist drug [T44.7X1A] 10/22/2014  . Altered mental status [R41.82]   . Overdose [T50.901A]   . Nocturnal enuresis [N39.44] 03/23/2014  . Benzodiazepine dependence, continuous [F13.20] 01/27/2014  . Frozen shoulder syndrome [M75.00] 09/20/2013  . Somnolence [R40.0] 09/20/2013  . Schizoaffective disorder [F25.9] 09/20/2013  . Malaise and fatigue [R53.81, R53.83] 04/21/2013  . Presbyopia [H52.4] 04/25/2012  . Androgen deficiency [E29.1] 09/07/2011  . Constipation, chronic  [K59.00] 09/07/2011  . Other specified disorders of liver [K76.89] 12/30/2008  . Overweight(278.02) [E66.3] 09/08/2008  . HYPERLIPIDEMIA [E78.5] 01/28/2008  . HYPERTENSION [I10] 01/28/2008  . Seizure disorder [G40.909] 01/28/2008  . OSTEOPENIA [M89.9, M94.9] 07/01/2007   Total Time spent with patient: 25 minutes   Past Medical History:  Past Medical History  Diagnosis Date  . Seizures   . Hypertension   . GERD (gastroesophageal reflux disease)   . Dyslipidemia   . Schizoaffective disorder   . Depression   . Anxiety   . MRSA carrier   . Constipation   . Memory loss   . Hearing loss     Past Surgical History  Procedure Laterality Date  . Self orchectomy    . Colonoscopy  2010  . Orif shoulder fracture    . Shoulder closed reduction Left 09/16/2013    Procedure: CLOSED REDUCTION SHOULDER;  Surgeon: Mauri Pole, MD;  Location: [facility];  Service: Orthopedics;  Laterality: Left;  . Shoulder hemi-arthroplasty Left 09/18/2013    Procedure: LEFT SHOULDER HEMI-ARTHROPLASTY;  Surgeon: Augustin Schooling, MD;  Location: [place];  Service: Orthopedics;  Laterality: Left;  . Carpal tunnel release     Family History:  Family History  Problem Relation Age of Onset  . Stroke Father     Living at 76  . Stroke Mother     Stroke in late 66's. Died in her 57's   Social History:  History  Alcohol Use No     History  Drug Use No    History   Social History  . Marital Status: Single    Spouse Name: N/A  . Number of Children: 0  . Years of Education: 18   Occupational History  . disabled    Social History Main Topics  . Smoking status: Never Smoker   . Smokeless tobacco: Never Used  . Alcohol Use: No  . Drug Use: No  . Sexual Activity: No   Other Topics Concern  . None   Social History Narrative   Patient does not drink caffeine.   Patient is left handed.   Additional History:    Sleep: Fair  Appetite:  Fair   Assessment: See above  Musculoskeletal: Strength &  Muscle Tone: within normal limits Gait & Station: Antalgic with intentional change in gait due to blister  Patient leans: N/A   Psychiatric Specialty Exam: Physical Exam  Review of Systems  Psychiatric/Behavioral: Positive for depression. Negative for suicidal ideas. The patient is nervous/anxious and has insomnia.   All other systems reviewed and are negative.   Blood pressure 149/85, pulse 72, temperature 98 F (36.7 C), temperature source Oral, resp. rate 20, height 5\' 11"  (1.803 m), weight 89.812 kg (198 lb).Body mass index is 27.63 kg/(m^2).  General Appearance: Bizarre and Fairly Groomed  Engineer, water::  Good  Speech:  Clear and Coherent and Normal Rate  Volume:  Normal  Mood:  Anxious  Affect:  Appropriate and Congruent  Thought Process:  Circumstantial  Orientation:  Full (Time, Place,  and Person)  Thought Content:  Obsessions and Rumination  Suicidal Thoughts:  No  Homicidal Thoughts:  No  Memory:  Immediate;   Poor Recent;   Poor Remote;   Poor  Judgement:  Impaired  Insight:  Lacking  Psychomotor Activity:  Increased  Concentration:  Good  Recall:  Poor  Fund of Knowledge:Fair  Language: Fair  Akathisia:  No although CPK ordered 07/14/15, pending  Handed:    AIMS (if indicated):     Assets:  Communication Skills Desire for Improvement Resilience Social Support  ADL's:  Intact  Cognition: WNL  Sleep:  Number of Hours: 6.75     Current Medications: Current Facility-Administered Medications  Medication Dose Route Frequency Provider Last Rate Last Dose  . acetaminophen (TYLENOL) tablet 650 mg  650 mg Oral Q6H PRN Laverle Hobby, PA-C      . alum & mag hydroxide-simeth (MAALOX/MYLANTA) 200-200-20 MG/5ML suspension 30 mL  30 mL Oral Q4H PRN Laverle Hobby, PA-C      . amitriptyline (ELAVIL) tablet 300 mg  300 mg Oral QHS Laverle Hobby, PA-C   300 mg at 07/13/15 2142  . aspirin EC tablet 81 mg  81 mg Oral Daily Laverle Hobby, PA-C   81 mg at 07/14/15 0836   . clonazePAM (KLONOPIN) tablet 2 mg  2 mg Oral QID Laverle Hobby, PA-C   2 mg at 07/14/15 1159  . hydrOXYzine (ATARAX/VISTARIL) tablet 50 mg  50 mg Oral Q6H PRN Hampton Abbot, MD      . lacosamide (VIMPAT) tablet 150 mg  150 mg Oral BID Laverle Hobby, PA-C   150 mg at 07/14/15 0836  . magnesium citrate solution 1 Bottle  1 Bottle Oral Once Kerrie Buffalo, NP   1 Bottle at 07/13/15 0300  . magnesium hydroxide (MILK OF MAGNESIA) suspension 30 mL  30 mL Oral Daily PRN Laverle Hobby, PA-C   30 mL at 07/14/15 1030  . metoprolol succinate (TOPROL-XL) 24 hr tablet 50 mg  50 mg Oral Daily Laverle Hobby, PA-C   50 mg at 07/14/15 0836  . phenytoin (DILANTIN) ER capsule 300 mg  300 mg Oral QHS Laverle Hobby, PA-C   300 mg at 07/13/15 2142  . polyethylene glycol (MIRALAX / GLYCOLAX) packet 17 g  17 g Oral BID Kerrie Buffalo, NP   17 g at 07/14/15 1030  . QUEtiapine (SEROQUEL) tablet 900 mg  900 mg Oral QHS Laverle Hobby, PA-C   900 mg at 07/13/15 2142  . senna-docusate (Senokot-S) tablet 1 tablet  1 tablet Oral QHS Laverle Hobby, PA-C   1 tablet at 07/13/15 2142  . simvastatin (ZOCOR) tablet 40 mg  40 mg Oral QHS Laverle Hobby, PA-C   40 mg at 07/13/15 2142  . testosterone cypionate (DEPOTESTOSTERONE CYPIONATE) injection 400 mg  400 mg Intramuscular Q14 Days Laverle Hobby, PA-C   400 mg at 07/13/15 1226    Lab Results:  Results for orders placed or performed during the hospital encounter of 07/13/15 (from the past 48 hour(s))  TSH     Status: None   Collection Time: 07/13/15  6:10 AM  Result Value Ref Range   TSH 2.644 0.350 - 4.500 uIU/mL    Comment: Performed at [facility]  Prolactin     Status: None   Collection Time: 07/13/15  6:10 AM  Result Value Ref Range   Prolactin 12.0 4.0 - 15.2 ng/mL    Comment: (  NOTE) Performed At: [facility] [place], [place] [id number] Lindon Romp MD [id number] Performed at [name]   Testosterone     Status: Abnormal   Collection Time: 07/13/15  6:10 AM  Result Value Ref Range   Testosterone 49 (L) 348 - 1197 ng/dL   Comment, Testosterone Comment     Comment: (NOTE) Adult male reference interval is based on a population of lean males up to 61 years old. Performed At: [facility] [place], [place] [id number] Lindon Romp MD [id number] Performed at [facility]   Testosterone, free     Status: Abnormal   Collection Time: 07/13/15  6:10 AM  Result Value Ref Range   Testosterone, Free 1.9 (L) 6.6 - 18.1 pg/mL    Comment: (NOTE) Performed At: [facility] [place], [place] [id number] Lindon Romp MD [id number] Performed at [facility]   Hemoglobin A1c     Status: Abnormal   Collection Time: 07/13/15  6:10 AM  Result Value Ref Range   Hgb A1c MFr Bld 5.9 (H) 4.8 - 5.6 %    Comment: (NOTE)         Pre-diabetes: 5.7 - 6.4         Diabetes: >6.4         Glycemic control for adults with diabetes: <7.0    Mean Plasma Glucose 123 mg/dL    Comment: (NOTE) Performed At: [facility] [place], [place] [id number] Lindon Romp MD [id number] Performed at [facility]   Lipid panel     Status: Abnormal   Collection Time: 07/13/15  6:10 AM  Result Value Ref Range   Cholesterol 175 0 - 200 mg/dL   Triglycerides 154 (H) <150 mg/dL   HDL 62 >40 mg/dL   Total CHOL/HDL Ratio 2.8 RATIO   VLDL 31 0 - 40 mg/dL   LDL Cholesterol 82 0 - 99 mg/dL    Comment:        Total Cholesterol/HDL:CHD Risk Coronary Heart Disease Risk Table                     Men   Women  1/2 Average Risk   3.4   3.3  Average Risk       5.0   4.4  2 X Average Risk   9.6   7.1  3 X Average Risk  23.4   11.0        Use the calculated Patient Ratio above and the CHD Risk Table to determine the patient's CHD Risk.        ATP III CLASSIFICATION  (LDL):  <100     mg/dL   Optimal  100-129  mg/dL   Near or Above                    Optimal  130-159  mg/dL   Borderline  160-189  mg/dL   High  >190     mg/dL   Very High Performed at [facility]   Sex hormone binding globulin     Status: None   Collection Time: 07/13/15  6:10 AM  Result Value Ref Range   Sex Hormone Binding 47.9 19.3 - 76.4 nmol/L    Comment: (NOTE) Performed At: [facility] [address] [place], [place] [id number] Lindon Romp MD [id number] Performed at [facility]  Physical Findings: AIMS: Facial and Oral Movements Muscles of Facial Expression: None, normal Lips and Perioral Area: None, normal Jaw: None, normal Tongue: None, normal,Extremity Movements Upper (arms, wrists, hands, fingers): None, normal Lower (legs, knees, ankles, toes): None, normal, Trunk Movements Neck, shoulders, hips: None, normal, Overall Severity Severity of abnormal movements (highest score from questions above): None, normal Incapacitation due to abnormal movements: None, normal Patient's awareness of abnormal movements (rate only patient's report): No Awareness, Dental Status Current problems with teeth and/or dentures?: No Does patient usually wear dentures?: No  CIWA:    COWS:     Treatment Plan Summary: Daily contact with patient to assess and evaluate symptoms and progress in treatment and Medication management   Medications: -Continue Clonazepam 2mg  qid for seizure prevention (Neurology follows this long-term x many years) -Continue Vistaril 50mg  q6h PRN anxiety (was titrated up 07/28 from 25mg -50mg  to help with pacing) -Continue Seroquel 900mg  qhs for mood stabilization/sleep/anxiety - Continue Elavil 300 MG daily for depression  -Continue non-psychiatric meds at home dose as ordered with no changes at this time   Medical Decision Making:  Established Problem, Stable/Improving (1), Review of Psycho-Social Stressors (1),  Review or order clinical lab tests (1), Review of Medication Regimen & Side Effects (2) and Review of New Medication or Change in Dosage (2)   Benjamine Mola, FNP-BC 07/14/2015, 10:01 AM Patient seen, evaluated by me and treatment plan formulated by me.. Increased the Vistaril to 50 MG Q8PRN to help decrease pacing. EKG ordered inorder to increase Ellavil. Hampton Abbot, MD

## 2015-07-14 NOTE — Tx Team (Signed)
Interdisciplinary Treatment Plan Update (Adult)  Date:  07/14/2015   Time Reviewed:  11:38 AM   Progress in Treatment: Attending groups: Yes. Participating in groups:  Yes. Taking medication as prescribed:  Yes. Tolerating medication:  Yes. Family/Significant other contact made:  No Patient understands diagnosis:  Yes  As evidenced by seeking help with depression Discussing patient identified problems/goals with staff:  Yes, see initial care plan. Medical problems stabilized or resolved:  Yes. Denies suicidal/homicidal ideation: Yes. Issues/concerns per patient self-inventory:  No. Other:  New problem(s) identified:  Discharge Plan or Barriers: return home, follow up outpt  Reason for Continuation of Hospitalization: Anxiety Depression Medication stabilization  Comments: [name] is a 59 yr old who states he is here for depression. Pt reports that he is very anxious and cannot stop himself from pacing down the hallways and that he has a blister on his left foot. Pt is visibly anxious and tangential, although appears to have improved from yesterday. Pt denies suicidal/homicidal ideation and psychosis and does not appear to be responding to internal stimuli. Pt is on heavy doses of medications and is known to have a history of memory problems which he reported in the last admission in February of 2015. We will monitor closely for worsening of those symptoms as well. Pt was counseled about his pacing and that he needs to slow down and rest so that he does not worsen the blister on his foot. Nursing staff has applied a gauze pad and it is not yet appropriate to lance due to unclear margins of fluid accumulation with potential to reabsorb if pt is compliant with rest recommendations.  Multiple meds prescribed for stabilization      Estimated length of stay: 4-5 days  New goal(s):  Review of initial/current patient goals per problem list:   Review of initial/current patient goals  per problem list:  1. Goal(s): Patient will participate in aftercare plan   Met: Yes   Target date: 3-5 days post admission date   As evidenced by: Patient will participate within aftercare plan AEB aftercare provider and housing plan at discharge being identified.  07/14/2015: Pt plans to return home, follow up outpt.     2. Goal (s): Patient will exhibit decreased depressive symptoms and suicidal ideations.   Met: No   Target date: 3-5 days post admission date   As evidenced by: Patient will utilize self rating of depression at 3 or below and demonstrate decreased signs of depression or be deemed stable for discharge by MD.  07/14/2015: [name] rates his depression at a 9 today.  3. Goal(s): Patient will demonstrate decreased signs and symptoms of anxiety.   Met: No   Target date: 3-5 days post admission date   As evidenced by: Patient will utilize self rating of anxiety at 3 or below and demonstrated decreased signs of anxiety, or be deemed stable for discharge by MD  07/14/2015: [name] rates his anxiety at a 10 today, and is seen pacing in the hall.     Attendees: Patient:  07/14/2015 11:38 AM   Family:   07/14/2015 11:38 AM   Physician:  Hampton Abbot, MD 07/14/2015 11:38 AM   Nursing:   Gaylan Gerold, RN 07/14/2015 11:38 AM   CSW:    Roque Lias, LCSW   07/14/2015 11:38 AM   Other:  07/14/2015 11:38 AM   Other:   07/14/2015 11:38 AM   Other:  Lars Pinks, Nurse CM 07/14/2015 11:38 AM   Other:  Lucinda Dell,  [facility] 07/14/2015 11:38 AM   Other:  Norberto Sorenson, [place]  07/14/2015 11:38 AM   Other:  07/14/2015 11:38 AM   Other:  07/14/2015 11:38 AM   Other:  07/14/2015 11:38 AM   Other:  07/14/2015 11:38 AM   Other:  07/14/2015 11:38 AM   Other:   07/14/2015 11:38 AM    Scribe for Treatment Team:   Trish Mage, 07/14/2015 11:38 AM

## 2015-07-14 NOTE — Progress Notes (Signed)
D: Pt at this time continues to verbalize moderate depression and anxiety. He states, "I have been like this since high school; just stay depressed and anxious for no reason; I am better than I was when I came in, still has a way to go." Pt however denies SI, HI, AVH and pain. A: Medications administered as prescribed.  Support, encouragement, and safe environment provided.  15-minute safety checks continues. R: Pt was med compliant.  Safety checks continues.

## 2015-07-14 NOTE — Progress Notes (Signed)
Patient ID: [name], male   DOB: July 26, 1956, 59 y.o.   MRN: [id number] D: Client has visitor (GF) this evening, reports flatness, seems to be preoccupied with not having any feelings "anything" reports depression "9" of 10 and anxiety "10" of 10, also reports poor concentration. Client notes admission has been helpful because "I'm walking, getting exercise, at home I'm in the bed a lot" Client actions seems to be incongruent with reports of himself as he seems to enjoy conversing with staff. A: Writer provides emotional support encourages client to focus on positive things. Writer reviews and administers medications. Staff will monitor q24min for safety. R:Client is safe on the unit, does not attend karaoke.

## 2015-07-14 NOTE — BHH Group Notes (Signed)
[place] Group Notes:  (Nursing/MHT/Case Management/Adjunct)  Date:  07/14/2015  Time:  0930  Type of Therapy:  Nurse Education  Participation Level:  Did Not Attend  Participation Quality:    Affect:    Cognitive:    Insight:    Engagement in Group:    Modes of Intervention:    Summary of Progress/Problems:  Franciso Bend 07/14/2015, 3:10 PM

## 2015-07-14 NOTE — BHH Group Notes (Signed)
[place] Group Notes:  (Counselor/Nursing/MHT/Case Management/Adjunct)  07/14/2015 1:15PM  Type of Therapy:  Group Therapy  Participation Level:Invited  Chose to not attend  Summary of Progress/Problems: The topic for group was balance in life.  Pt participated in the discussion about when their life was in balance and out of balance and how this feels.  Pt discussed ways to get back in balance and short term goals they can work on to get where they want to be.    Trish Mage 07/14/2015 4:26 PM

## 2015-07-14 NOTE — Progress Notes (Signed)
Patient ID: [name], male   DOB: Jul 27, 1956, 59 y.o.   MRN: [id number]  D: Patient drowsy on first approach this am. Reports increased depression. States all he does is lay in the bed all day. He reports he is always depressed and was having SI. Patient reports that the doctor was going to start him on an antidepressant but nothing new noted. EKG obtained today. Has a blister on lt foot. Gauze put over it to cushion when walking around on it.  A: Staff will monitor on q 15 minute checks, follow treatment plan, and give meds as ordered. R: Cooperative on the unit.

## 2015-07-15 LAB — GLUCOSE, CAPILLARY
Glucose-Capillary: 93 mg/dL (ref 65–99)
Glucose-Capillary: 99 mg/dL (ref 65–99)

## 2015-07-15 MED ORDER — BISACODYL 10 MG RE SUPP
10.0000 mg | Freq: Once | RECTAL | Status: AC
Start: 2015-07-15 — End: 2015-07-15
  Administered 2015-07-15: 10 mg via RECTAL
  Filled 2015-07-15 (×2): qty 1

## 2015-07-15 MED ORDER — AMITRIPTYLINE HCL 100 MG PO TABS
400.0000 mg | ORAL_TABLET | Freq: Every day | ORAL | Status: DC
  Administered 2015-07-15: 400 mg via ORAL
  Filled 2015-07-15 (×2): qty 4

## 2015-07-15 NOTE — Progress Notes (Signed)
The focus of this group is to help patients review their daily goal of treatment and discuss progress on daily workbooks. Pt attended the evening group session and responded to all discussion prompts from the [place]. Pt shared that today was an "okay" day on the unit. "Nothing good or bad happened. I had a visitor." Pt reported having no additional needs from Nursing Staff this evening. Kaynen spoke the most when commenting on what other group members had to say rather than when talking about himself. Pt appeared anxious in group, fidgeting and unable to maintain eye contact.

## 2015-07-15 NOTE — Progress Notes (Signed)
Patient ID: [name], male   DOB: 1956-03-23, 59 y.o.   MRN: [id number] D: client has visit earlier this shift with GF "she not my girl friend just a friend that comes to see me" but client and friend are embracing lying on the bed and at one point she was cutting his finger and toe nails. Client become agitated when friend leaves reports he doesn't want her coming to see him, "I want to tell her but in a nice way, I don't want to hurt her feelings" Client then says she will be returning to bring him a book. Client seems to have conflicting feelings about this friend. Client tears up a hardback book standing at nursing station says "I don't want nothing from that woman" client is somatic and refuses Seroquel "it causes anticholinergic effects, and making me dry and stopping my bowel movement" "I don't want it" "the nurse said my bowel sounds were abnormal" "what if I wake up and my bowels are perforated"  "I need to go to hospital and have a barium enema" A: Writer provided emotional support encouraged medications, noting that benefits outweighs the risk and that medications have been screen by pharmacy and physician. Writer encouraged water to help prevent dry mouth. Staff will monitor q81min for safety. R:Client is safe on the unit, attended group. Client later encouraged to take Seroquel, but tore up another hard back book this one was a bible, stating "I would wipe my behind with it, if I could"

## 2015-07-15 NOTE — BHH Group Notes (Signed)
[place] LCSW Group Therapy  07/15/2015  1:05 PM  Type of Therapy:  Group therapy  Participation Level:  Active  Participation Quality:  Attentive  Affect:  Flat  Cognitive:  Oriented  Insight:  Limited  Engagement in Therapy:  Limited  Modes of Intervention:  Discussion, Socialization  Summary of Progress/Problems:  Chaplain was here to lead a group on themes of hope and courage.  Got focused on another patient who was talking about his struggles with alcohol.  "I can drink one or two beers, and then I'm done.  There's nothing wrong with that.  I don't think that AA can help.  The 12 steps don't even address drinking."  Difficult to redirect.  "I once awoke into a dual state of consciousness." [name] 07/15/2015 1:21 PM

## 2015-07-15 NOTE — BHH Group Notes (Signed)
[facility] LCSW Aftercare Discharge Planning Group Note   07/15/2015 12:16 PM  Participation Quality:  Engaged  Mood/Affect:  Depressed  Depression Rating:  8  Anxiety Rating:  8  Thoughts of Suicide:  Yes Will you contract for safety?   Yes  Current AVH:  No  Plan for Discharge/Comments:  C/O anhedonia, anxiety.  Was seen pacing in hall prior to group.  When I expressed surprise that he was in group and not in bed, he stated if he was at home, he would be in bed, and he does not feel any better.  Complimented him on getting up despite not feeling better, and acting "as if" things are better than they are.  Transportation Means:   Supports:  [name]

## 2015-07-15 NOTE — Progress Notes (Addendum)
[facility] MD Progress Note  07/15/2015 1:29 PM [name]  MRN:  [id number] Subjective:  Today, 07/15/15, pt seen and chart reviewed. Pt reports that his mood remains in depressed state.  He appears frsutrated and quiet today.  He reported that he has worn a blister on his foot from constantly pacing the hallways.  He is visibly anxious.  Discussed in length about benefits of ECT treatment as patient has persistent and chronic depression.  Offered a follow up appt with ECT outpatient provider to further explain procedure, patient deferred. Pt is on heavy doses of medications and is known to have a history of memory problems which he reported in the last admission in February of 2015. We will monitor closely for worsening of those symptoms as well. Pt was counseled about his pacing and that he needs to slow down and rest so that he does not worsen the blister on his foot. Nursing staff has applied a gauze pad and it is not yet appropriate to lance due to unclear margins of fluid accumulation with potential to reabsorb if pt is compliant with rest recommendations.   Interval History: 07/13/15: [name] is a 59 yr old who states he is here for depression.  "What it was I transcended duality. The state of consciousness. I believed that is heaven. In other religions, Nirvana sumpati. It happened once in the 9th grade. i believe the religion to be the same. I wasn't planning on using the heluim. It could be a year from now I might could use it. It was not an Artist. It was in the 11th grade. I was trying to get ready with the helium and the mask. I was not going to kill myself. I just needed it ready. I believe my brother was trying to kill me.   A few minutes later in the course of the interview, patient became more lucid. He reported being constipated. I normally take Miralax twice a day. He reported that he has had gaps in memory. He feels lonely not being able to  make friends, He has attempted to join groups to be more social. At times, he has so much in his mind. 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 He would not know which part of the room to start. He also said that when he was in college, he would get notes from his fellow student because he would be to nervous to go inside a big classroom. He also describes himself that he would forget words and would make it impossible to keep a thread of thought and conversation.   Principal Problem: Schizoaffective disorder, depressive type with good prognostic features Diagnosis:   Patient Active Problem List   Diagnosis Date Noted  . Schizoaffective disorder, depressive type with good prognostic features [F25.1] 07/13/2015  . Abdominal pain [R10.9]   . Constipation [K59.00]   . Verruca [B07.9] 05/18/2015  . Seborrheic dermatitis of scalp [L21.8] 05/18/2015  . Blood poisoning [A41.9]   . MDD (major depressive disorder), recurrent severe, without psychosis [F33.2]   . OCD (obsessive compulsive disorder) [F42]   . Confusion [R41.0] 12/31/2014  . Seizures [R56.9]   . Schizoaffective disorder, depressive type [F25.1]   . Orthostatic hypotension [I95.1]   . Hyperlipidemia [E78.5]   . Falls [id number]]   . HLD (hyperlipidemia) [E78.5]   . Seizure [R56.9] 11/18/2014  . Schizoaffective disorder, unspecified type [F25.9]   . Essential hypertension [I10]   . Overdose of beta-adrenergic  antagonist drug [T44.7X1A] 10/22/2014  . Altered mental status [R41.82]   . Overdose [T50.901A]   . Nocturnal enuresis [N39.44] 03/23/2014  . Benzodiazepine dependence, continuous [F13.20] 01/27/2014  . Opiate dependence [F11.20] 01/22/2014  . Severe major depression [F32.2] 01/21/2014  . Frozen shoulder syndrome [M75.00] 09/20/2013  . Somnolence [R40.0] 09/20/2013  . Schizoaffective disorder [F25.9] 09/20/2013  . Malaise and fatigue [R53.81, R53.83] 04/21/2013  . Presbyopia [H52.4] 04/25/2012  .  Androgen deficiency [E29.1] 09/07/2011  . Constipation, chronic [K59.00] 09/07/2011  . Other specified disorders of liver [K76.89] 12/30/2008  . Overweight(278.02) [E66.3] 09/08/2008  . HYPERLIPIDEMIA [E78.5] 01/28/2008  . HYPERTENSION [I10] 01/28/2008  . Seizure disorder [G40.909] 01/28/2008  . OSTEOPENIA [M89.9, M94.9] 07/01/2007   Total Time spent with patient: 25 minutes   Past Medical History:  Past Medical History  Diagnosis Date  . Seizures   . Hypertension   . GERD (gastroesophageal reflux disease)   . Dyslipidemia   . Schizoaffective disorder   . Depression   . Anxiety   . MRSA carrier   . Constipation   . Memory loss   . Hearing loss     Past Surgical History  Procedure Laterality Date  . Self orchectomy    . Colonoscopy  2010  . Orif shoulder fracture    . Shoulder closed reduction Left 09/16/2013    Procedure: CLOSED REDUCTION SHOULDER;  Surgeon: Mauri Pole, MD;  Location: [facility];  Service: Orthopedics;  Laterality: Left;  . Shoulder hemi-arthroplasty Left 09/18/2013    Procedure: LEFT SHOULDER HEMI-ARTHROPLASTY;  Surgeon: Augustin Schooling, MD;  Location: [facility];  Service: Orthopedics;  Laterality: Left;  . Carpal tunnel release     Family History:  Family History  Problem Relation Age of Onset  . Stroke Father     Living at 60  . Stroke Mother     Stroke in late 47's. Died in her 42's   Social History:  History  Alcohol Use No     History  Drug Use No    History   Social History  . Marital Status: Single    Spouse Name: N/A  . Number of Children: 0  . Years of Education: 18   Occupational History  . disabled    Social History Main Topics  . Smoking status: Never Smoker   . Smokeless tobacco: Never Used  . Alcohol Use: No  . Drug Use: No  . Sexual Activity: No   Other Topics Concern  . None   Social History Narrative   Patient does not drink caffeine.   Patient is left handed.   Additional History:    Sleep: Fair  Appetite:   Fair   Assessment: See above  Musculoskeletal: Strength & Muscle Tone: within normal limits Gait & Station: Antalgic with intentional change in gait due to blister  Patient leans: N/A   Psychiatric Specialty Exam: Physical Exam  Review of Systems  Constitutional: Negative.  Negative for fever, weight loss and malaise/fatigue.  HENT: Negative.  Negative for congestion and sore throat.   Eyes: Negative.  Negative for blurred vision, discharge and redness.  Respiratory: Negative.  Negative for cough and wheezing.   Cardiovascular: Negative.  Negative for chest pain and palpitations.  Gastrointestinal: Positive for constipation. Negative for heartburn, nausea, vomiting, abdominal pain and diarrhea.  Genitourinary: Negative.  Negative for dysuria.  Musculoskeletal: Negative.  Negative for myalgias.  Skin: Negative for rash.  Neurological: Negative.  Negative for dizziness, seizures, loss of consciousness, weakness and  headaches.  Endo/Heme/Allergies: Negative.  Negative for environmental allergies.  Psychiatric/Behavioral: Positive for depression. Negative for suicidal ideas. The patient is nervous/anxious and has insomnia.   All other systems reviewed and are negative.   Blood pressure 131/83, pulse 73, temperature 97.5 F (36.4 C), temperature source Oral, resp. rate 18, height 5\' 11"  (1.803 m), weight 89.812 kg (198 lb).Body mass index is 27.63 kg/(m^2).  General Appearance: Bizarre and Fairly Groomed  Engineer, water::  Good  Speech:  Clear and Coherent and Normal Rate  Volume:  Normal  Mood:  Anxious  Affect:  Appropriate and Congruent  Thought Process:  Circumstantial  Orientation:  Full (Time, Place, and Person)  Thought Content:  Obsessions and Rumination  Suicidal Thoughts:  No  Homicidal Thoughts:  No  Memory:  Immediate;   Poor Recent;   Poor Remote;   Poor  Judgement:  Impaired  Insight:  Lacking  Psychomotor Activity:  Increased  Concentration:  Good  Recall:   Poor  Fund of Knowledge:Fair  Language: Fair  Akathisia:  No although CPK ordered 07/14/15, pending  Handed:    AIMS (if indicated):     Assets:  Communication Skills Desire for Improvement Resilience Social Support  ADL's:  Intact  Cognition: WNL  Sleep:  Number of Hours: 6.25     Current Medications: Current Facility-Administered Medications  Medication Dose Route Frequency Provider Last Rate Last Dose  . acetaminophen (TYLENOL) tablet 650 mg  650 mg Oral Q6H PRN Laverle Hobby, PA-C   650 mg at 07/15/15 0758  . alum & mag hydroxide-simeth (MAALOX/MYLANTA) 200-200-20 MG/5ML suspension 30 mL  30 mL Oral Q4H PRN Laverle Hobby, PA-C      . amitriptyline (ELAVIL) tablet 400 mg  400 mg Oral QHS Hampton Abbot, MD      . aspirin EC tablet 81 mg  81 mg Oral Daily Laverle Hobby, PA-C   81 mg at 07/15/15 0756  . clonazePAM (KLONOPIN) tablet 2 mg  2 mg Oral QID Laverle Hobby, PA-C   2 mg at 07/15/15 1153  . hydrOXYzine (ATARAX/VISTARIL) tablet 50 mg  50 mg Oral Q6H PRN Hampton Abbot, MD      . lacosamide (VIMPAT) tablet 150 mg  150 mg Oral BID Laverle Hobby, PA-C   150 mg at 07/15/15 1497  . magnesium hydroxide (MILK OF MAGNESIA) suspension 30 mL  30 mL Oral Daily PRN Laverle Hobby, PA-C   30 mL at 07/14/15 1030  . metoprolol succinate (TOPROL-XL) 24 hr tablet 50 mg  50 mg Oral Daily Laverle Hobby, PA-C   50 mg at 07/15/15 0759  . phenytoin (DILANTIN) ER capsule 300 mg  300 mg Oral QHS Laverle Hobby, PA-C   300 mg at 07/14/15 2209  . polyethylene glycol (MIRALAX / GLYCOLAX) packet 17 g  17 g Oral BID Kerrie Buffalo, NP   17 g at 07/15/15 0803  . QUEtiapine (SEROQUEL) tablet 900 mg  900 mg Oral QHS Laverle Hobby, PA-C   900 mg at 07/14/15 2209  . senna-docusate (Senokot-S) tablet 1 tablet  1 tablet Oral QHS Laverle Hobby, PA-C   1 tablet at 07/14/15 2209  . simvastatin (ZOCOR) tablet 40 mg  40 mg Oral QHS Laverle Hobby, PA-C   40 mg at 07/14/15 2209  . testosterone  cypionate (DEPOTESTOSTERONE CYPIONATE) injection 400 mg  400 mg Intramuscular Q14 Days Laverle Hobby, PA-C   400 mg at 07/13/15 1226    Lab  Results:  Results for orders placed or performed during the hospital encounter of 07/13/15 (from the past 48 hour(s))  Glucose, capillary     Status: None   Collection Time: 07/14/15  4:54 PM  Result Value Ref Range   Glucose-Capillary 99 65 - 99 mg/dL  CK     Status: None   Collection Time: 07/14/15  7:05 PM  Result Value Ref Range   Total CK 196 49 - 397 U/L    Comment: Performed at [facility]  Glucose, capillary     Status: None   Collection Time: 07/15/15 11:48 AM  Result Value Ref Range   Glucose-Capillary 93 65 - 99 mg/dL   Comment 1 Notify RN    Comment 2 Document in Chart     Physical Findings: AIMS: Facial and Oral Movements Muscles of Facial Expression: None, normal Lips and Perioral Area: None, normal Jaw: None, normal Tongue: None, normal,Extremity Movements Upper (arms, wrists, hands, fingers): None, normal Lower (legs, knees, ankles, toes): None, normal, Trunk Movements Neck, shoulders, hips: None, normal, Overall Severity Severity of abnormal movements (highest score from questions above): None, normal Incapacitation due to abnormal movements: None, normal Patient's awareness of abnormal movements (rate only patient's report): No Awareness, Dental Status Current problems with teeth and/or dentures?: No Does patient usually wear dentures?: No  CIWA:    COWS:     Treatment Plan Summary: Daily contact with patient to assess and evaluate symptoms and progress in treatment and Medication management   Medications: -Continue Clonazepam 2mg  qid for seizure prevention (Neurology follows this long-term x many years) -Continue Vistaril 50mg  q6h PRN anxiety (was titrated up 07/28 from 25mg -50mg  to help with pacing) -Continue Seroquel 900mg  qhs for mood stabilization/sleep/anxiety - Increase Elavil 400 MG  daily for depression  -Continue non-psychiatric meds at home dose as ordered with no changes at this time -ECG ordered for safety monitoring as Elavil dosing to be increased.  Medical Decision Making:  Established Problem, Stable/Improving (1), Review of Psycho-Social Stressors (1), Review or order clinical lab tests (1), Review of Medication Regimen & Side Effects (2) and Review of New Medication or Change in Dosage (2)   Nicollet, AGNP-BC 07/15/2015, 10:01 AM  Patient seen, evaluated by me and treatment plan formulated by me. Patient's Elavil was increased to 400 mg at bedtime as EKG did not show any changes in comparison to the previous EKG and shows normal sinus rhythm. Discussed in length ECT with patient and patient refuses. Hampton Abbot, MD

## 2015-07-15 NOTE — Progress Notes (Signed)
Pt did not attend karaoke this evening.

## 2015-07-15 NOTE — Plan of Care (Signed)
Problem: Diagnosis: Increased Risk For Suicide Attempt Goal: STG-Patient Will Comply With Medication Regime Outcome: Progressing Client takes medication as prescribed without incidence "I think I'll take my medication now"

## 2015-07-15 NOTE — Progress Notes (Addendum)
Pt appears very anxious and walks the halls frequently. He rates his depression a 7/10, hopelessness a 7/10 and his anixety a 8/10. Pt wants to try to start working out everyday and make himself get out of bed. He is very negative and stated the only thing he enjoys is sometimes working on his computer while lying in the bed. Pt stated,"I have a flat affect and do not enjoy anything in my life." He does contracts for safety. Pt c/o blisters on his right foot. Will evaluate. Pt is very somatic and c/o a headache earlier and received 2 tylenol. He also c/o a blister on his foot as well. 3p-Pt stated,"I feel very nervous and feel like worms are crawling all over me." Pt was medicated with 50mg  of visteral. Pt stated he has not had a BM times 8 days. NP made aware. BS hypoactive in all fields. NP aware. Pt abd is distended and firm and nontender. Pt encouraged to drink water and eat ruffage. 6p-Pt was given a dulcolax suppository to give himself. He still has not had a BM. NP aware . Pt stated in the past he has had to have a barium enema . Pt will be given prune juice to drink also.

## 2015-07-16 LAB — COMPREHENSIVE METABOLIC PANEL
ALT: 18 U/L (ref 17–63)
AST: 17 U/L (ref 15–41)
Albumin: 3.8 g/dL (ref 3.5–5.0)
Alkaline Phosphatase: 88 U/L (ref 38–126)
Anion gap: 8 (ref 5–15)
BUN: 17 mg/dL (ref 6–20)
CO2: 26 mmol/L (ref 22–32)
Calcium: 8.8 mg/dL — ABNORMAL LOW (ref 8.9–10.3)
Chloride: 106 mmol/L (ref 101–111)
Creatinine, Ser: 1.08 mg/dL (ref 0.61–1.24)
GFR calc Af Amer: >60 mL/min (ref >60)
GFR calc non Af Amer: >60 mL/min (ref >60)
Glucose, Bld: 91 mg/dL (ref 65–99)
Potassium: 4.3 mmol/L (ref 3.5–5.1)
Sodium: 140 mmol/L (ref 135–145)
Total Bilirubin: 0.3 mg/dL (ref 0.3–1.2)
Total Protein: 6.9 g/dL (ref 6.5–8.1)

## 2015-07-16 LAB — GLUCOSE, CAPILLARY
Glucose-Capillary: 101 mg/dL — ABNORMAL HIGH (ref 65–99)
Glucose-Capillary: 106 mg/dL — ABNORMAL HIGH (ref 65–99)
Glucose-Capillary: 88 mg/dL (ref 65–99)

## 2015-07-16 MED ORDER — CLONAZEPAM 1 MG PO TABS
2.0000 mg | ORAL_TABLET | Freq: Three times a day (TID) | ORAL | Status: DC
  Administered 2015-07-17 – 2015-07-21 (×14): 2 mg via ORAL
  Filled 2015-07-16 (×14): qty 2

## 2015-07-16 MED ORDER — QUETIAPINE FUMARATE 300 MG PO TABS
600.0000 mg | ORAL_TABLET | Freq: Every day | ORAL | Status: DC
  Administered 2015-07-16 – 2015-07-20 (×5): 600 mg via ORAL
  Filled 2015-07-16 (×3): qty 2
  Filled 2015-07-16: qty 10
  Filled 2015-07-16 (×3): qty 2

## 2015-07-16 MED ORDER — AMITRIPTYLINE HCL 100 MG PO TABS
300.0000 mg | ORAL_TABLET | Freq: Every day | ORAL | Status: DC
  Administered 2015-07-16 – 2015-07-20 (×5): 300 mg via ORAL
  Filled 2015-07-16: qty 15
  Filled 2015-07-16 (×6): qty 3

## 2015-07-16 NOTE — [facility] Group Notes (Signed)
[facility] Notes: (Clinical Social Work)   07/16/2015      Type of Therapy:  Group Therapy   Participation Level:  Did Not Attend despite MHT prompting   Selmer Dominion, LCSW 07/16/2015, 12:17 PM

## 2015-07-16 NOTE — Progress Notes (Addendum)
[facility] MD Progress Note  07/16/2015 10:07 AM [name]  MRN:  [id number]   Subjective:  I have constipation .  I have no bowel movements for past 7   Objective Patient seen chart reviewed.  Patient has a difficult night.  He did not slept well. He was noticed confused, ripping Bible, urinating on the floor and afraid to go to the bathroom because he was thinking there are snakes in the bathroom.  He has been decompensated the past 24 hours.  He is focused on his bowel movements.  He has given medication for bowel movements however has not seen any improvement.  He remembered that he is in [facility] but did not provide much detail.  He believe he was here for blood work.  Patient was placed on one-to-one because of safety concerns.  He continued to endorse depression which is severe.  He denies any hallucination however his attention, focus and thinking is clouded.  He is complaining of dry mouth.  Though he denies any suicidal thoughts but admitted that sometime he feels hopeless helpless.  He mentioned he had argument with his girlfriend who came to visit him last night.  Patient remains very disorganized, delusional and depressed.  He is compliant with medication.  He is taking Elavil 400 mg, Klonopin 2 mg 4 times a day, Seroquel 900 mg at bedtime.  Patient has history of seizures.  He is also on Dilantin. He has blister on his foot because he is pacing the hallway.  There has a discussion of ECT treatment however patient does not want ECT.  Principal Problem: Schizoaffective disorder, depressive type with good prognostic features Diagnosis:   Patient Active Problem List   Diagnosis Date Noted  . Schizoaffective disorder, depressive type with good prognostic features [F25.1] 07/13/2015  . Abdominal pain [R10.9]   . Constipation [K59.00]   . Verruca [B07.9] 05/18/2015  . Seborrheic dermatitis of scalp [L21.8] 05/18/2015  . Blood poisoning [A41.9]   . MDD (major  depressive disorder), recurrent severe, without psychosis [F33.2]   . OCD (obsessive compulsive disorder) [F42]   . Confusion [R41.0] 12/31/2014  . Seizures [R56.9]   . Schizoaffective disorder, depressive type [F25.1]   . Orthostatic hypotension [I95.1]   . Hyperlipidemia [E78.5]   . Falls [phone].[id number]]   . HLD (hyperlipidemia) [E78.5]   . Seizure [R56.9] 11/18/2014  . Schizoaffective disorder, unspecified type [F25.9]   . Essential hypertension [I10]   . Overdose of beta-adrenergic antagonist drug [T44.7X1A] 10/22/2014  . Altered mental status [R41.82]   . Overdose [T50.901A]   . Nocturnal enuresis [N39.44] 03/23/2014  . Benzodiazepine dependence, continuous [F13.20] 01/27/2014  . Opiate dependence [F11.20] 01/22/2014  . Severe major depression [F32.2] 01/21/2014  . Frozen shoulder syndrome [M75.00] 09/20/2013  . Somnolence [R40.0] 09/20/2013  . Schizoaffective disorder [F25.9] 09/20/2013  . Malaise and fatigue [R53.81, R53.83] 04/21/2013  . Presbyopia [H52.4] 04/25/2012  . Androgen deficiency [E29.1] 09/07/2011  . Constipation, chronic [K59.00] 09/07/2011  . Other specified disorders of liver [K76.89] 12/30/2008  . Overweight(278.02) [E66.3] 09/08/2008  . HYPERLIPIDEMIA [E78.5] 01/28/2008  . HYPERTENSION [I10] 01/28/2008  . Seizure disorder [G40.909] 01/28/2008  . OSTEOPENIA [M89.9, M94.9] 07/01/2007   Total Time spent with patient: 25 minutes   Past Medical History:  Past Medical History  Diagnosis Date  . Seizures   . Hypertension   . GERD (gastroesophageal reflux disease)   . Dyslipidemia   . Schizoaffective disorder   . Depression   . Anxiety   .  MRSA carrier   . Constipation   . Memory loss   . Hearing loss     Past Surgical History  Procedure Laterality Date  . Self orchectomy    . Colonoscopy  2010  . Orif shoulder fracture    . Shoulder closed reduction Left 09/16/2013    Procedure: CLOSED REDUCTION SHOULDER;  Surgeon: Mauri Pole, MD;  Location: [facility];  Service: Orthopedics;  Laterality: Left;  . Shoulder hemi-arthroplasty Left 09/18/2013    Procedure: LEFT SHOULDER HEMI-ARTHROPLASTY;  Surgeon: Augustin Schooling, MD;  Location: [facility];  Service: Orthopedics;  Laterality: Left;  . Carpal tunnel release     Family History:  Family History  Problem Relation Age of Onset  . Stroke Father     Living at 44  . Stroke Mother     Stroke in late 64's. Died in her 42's   Social History:  History  Alcohol Use No     History  Drug Use No    History   Social History  . Marital Status: Single    Spouse Name: N/A  . Number of Children: 0  . Years of Education: 18   Occupational History  . disabled    Social History Main Topics  . Smoking status: Never Smoker   . Smokeless tobacco: Never Used  . Alcohol Use: No  . Drug Use: No  . Sexual Activity: No   Other Topics Concern  . None   Social History Narrative   Patient does not drink caffeine.   Patient is left handed.   Additional History:    Sleep: Fair  Appetite:  Fair   Assessment: See above  Musculoskeletal: Strength & Muscle Tone: within normal limits Gait & Station: Antalgic with intentional change in gait due to blister  Patient leans: N/A   Psychiatric Specialty Exam: Physical Exam  Review of Systems  Constitutional: Negative.  Negative for fever, weight loss and malaise/fatigue.  HENT: Negative.  Negative for congestion and sore throat.   Eyes: Negative.  Negative for blurred vision, discharge and redness.  Respiratory: Negative.  Negative for cough and wheezing.   Cardiovascular: Negative.  Negative for chest pain and palpitations.  Gastrointestinal: Positive for constipation. Negative for heartburn, nausea, vomiting, abdominal pain and diarrhea.  Genitourinary: Negative.  Negative for dysuria.  Musculoskeletal: Negative.  Negative for myalgias.  Skin: Negative for rash.  Neurological: Negative.  Negative for dizziness, seizures, loss of  consciousness, weakness and headaches.  Endo/Heme/Allergies: Negative.  Negative for environmental allergies.  Psychiatric/Behavioral: Positive for depression. Negative for suicidal ideas. The patient is nervous/anxious and has insomnia.   All other systems reviewed and are negative.   Blood pressure 134/100, pulse 72, temperature 98.7 F (37.1 C), temperature source Oral, resp. rate 18, height 5\' 11"  (1.803 m), weight 89.812 kg (198 lb).Body mass index is 27.63 kg/(m^2).  General Appearance: Bizarre, Disheveled and Fairly Groomed  Engineer, water::  Fair  Speech:  Normal Rate  Volume:  Normal  Mood:  Anxious  Affect:  Appropriate and Congruent  Thought Process:  Circumstantial  Orientation:  Full (Time, Place, and Person)  Thought Content:  Obsessions and Rumination  Suicidal Thoughts:  No  Homicidal Thoughts:  No  Memory:  Immediate;   Poor Recent;   Poor Remote;   Poor  Judgement:  Impaired  Insight:  Lacking  Psychomotor Activity:  Increased  Concentration:  Good  Recall:  Poor  Fund of Knowledge:Fair  Language: Fair  Akathisia:  No  although CPK ordered 07/14/15, pending  Handed:    AIMS (if indicated):     Assets:  Communication Skills Desire for Improvement Resilience Social Support  ADL's:  Intact  Cognition: WNL  Sleep:  Number of Hours: 5.75     Current Medications: Current Facility-Administered Medications  Medication Dose Route Frequency Provider Last Rate Last Dose  . acetaminophen (TYLENOL) tablet 650 mg  650 mg Oral Q6H PRN Laverle Hobby, PA-C   650 mg at 07/15/15 0758  . alum & mag hydroxide-simeth (MAALOX/MYLANTA) 200-200-20 MG/5ML suspension 30 mL  30 mL Oral Q4H PRN Laverle Hobby, PA-C      . amitriptyline (ELAVIL) tablet 400 mg  400 mg Oral QHS Hampton Abbot, MD   400 mg at 07/15/15 2139  . aspirin EC tablet 81 mg  81 mg Oral Daily Laverle Hobby, PA-C   81 mg at 07/16/15 9485  . clonazePAM (KLONOPIN) tablet 2 mg  2 mg Oral QID Laverle Hobby, PA-C    2 mg at 07/16/15 0809  . hydrOXYzine (ATARAX/VISTARIL) tablet 50 mg  50 mg Oral Q6H PRN Hampton Abbot, MD   50 mg at 07/15/15 1452  . lacosamide (VIMPAT) tablet 150 mg  150 mg Oral BID Laverle Hobby, PA-C   150 mg at 07/16/15 4627  . magnesium hydroxide (MILK OF MAGNESIA) suspension 30 mL  30 mL Oral Daily PRN Laverle Hobby, PA-C   30 mL at 07/14/15 1030  . metoprolol succinate (TOPROL-XL) 24 hr tablet 50 mg  50 mg Oral Daily Laverle Hobby, PA-C   50 mg at 07/16/15 0809  . phenytoin (DILANTIN) ER capsule 300 mg  300 mg Oral QHS Laverle Hobby, PA-C   300 mg at 07/15/15 2140  . polyethylene glycol (MIRALAX / GLYCOLAX) packet 17 g  17 g Oral BID Kerrie Buffalo, NP   17 g at 07/16/15 0810  . QUEtiapine (SEROQUEL) tablet 900 mg  900 mg Oral QHS Laverle Hobby, PA-C   900 mg at 07/15/15 2224  . senna-docusate (Senokot-S) tablet 1 tablet  1 tablet Oral QHS Laverle Hobby, PA-C   1 tablet at 07/15/15 2140  . simvastatin (ZOCOR) tablet 40 mg  40 mg Oral QHS Laverle Hobby, PA-C   40 mg at 07/15/15 2140  . testosterone cypionate (DEPOTESTOSTERONE CYPIONATE) injection 400 mg  400 mg Intramuscular Q14 Days Laverle Hobby, PA-C   400 mg at 07/13/15 1226    Lab Results:  Results for orders placed or performed during the hospital encounter of 07/13/15 (from the past 48 hour(s))  Glucose, capillary     Status: None   Collection Time: 07/14/15  4:54 PM  Result Value Ref Range   Glucose-Capillary 99 65 - 99 mg/dL  CK     Status: None   Collection Time: 07/14/15  7:05 PM  Result Value Ref Range   Total CK 196 49 - 397 U/L    Comment: Performed at [facility]  Glucose, capillary     Status: None   Collection Time: 07/15/15 11:48 AM  Result Value Ref Range   Glucose-Capillary 93 65 - 99 mg/dL   Comment 1 Notify RN    Comment 2 Document in Chart   Glucose, capillary     Status: None   Collection Time: 07/15/15  4:51 PM  Result Value Ref Range   Glucose-Capillary 99 65 - 99  mg/dL    Physical Findings: AIMS: Facial and Oral Movements  Muscles of Facial Expression: None, normal Lips and Perioral Area: None, normal Jaw: None, normal Tongue: None, normal,Extremity Movements Upper (arms, wrists, hands, fingers): None, normal Lower (legs, knees, ankles, toes): None, normal, Trunk Movements Neck, shoulders, hips: None, normal, Overall Severity Severity of abnormal movements (highest score from questions above): None, normal Incapacitation due to abnormal movements: None, normal Patient's awareness of abnormal movements (rate only patient's report): No Awareness, Dental Status Current problems with teeth and/or dentures?: No Does patient usually wear dentures?: No  CIWA:    COWS:     Treatment Plan Summary: Daily contact with patient to assess and evaluate symptoms and progress in treatment and Medication management   Medications: -Reviewed current medication , patient is taking moderate dose of anticholinergic medication.  We will reduce the dose of Seroquel, Klonopin, amitriptyline .  Patient appears somewhat sedated, confused and having side effects of medication.  We will get amitriptyline level .  Patient will be placed on 1:1 watch to closely monitor his behavior.  We will also closely monitor his bowel movements and if no relief we will consider setting into the emergency room for medical clearance.  Patient has EKG which is unchanged from the past.   Medical Decision Making:  New problem, with additional work up planned, Review or order clinical lab tests (1), Established Problem, Worsening (2), New Problem, with no additional work-up planned (3), Review of Medication Regimen & Side Effects (2) and Review of New Medication or Change in Dosage (2)   Malone Admire T., MD 07/16/2015,

## 2015-07-16 NOTE — Progress Notes (Signed)
Pt stated that he was very appreciative of all the help that he has received from the staff. He also stated that his day was overall "lousy" and he expects it to stay that way because of his depression. Pt encouraged to find at least one positive thing in his day, he could not think of anything.

## 2015-07-16 NOTE — Plan of Care (Signed)
Problem: Diagnosis: Increased Risk For Suicide Attempt Goal: STG-Patient Will Attend All Groups On The Unit Outcome: Progressing Pt attended evening group on 07/16/15.

## 2015-07-16 NOTE — Progress Notes (Signed)
Patient ID: [name], male   DOB: 1956-08-09, 59 y.o.   MRN: [id number] D: Client found sitting on floor in his room, reported "I tried to crawl to BR" client was somnolent, but able to converse. Client bed and floor wet with urine. A: Staff assisted client to BR, with the use of W/C. VS: BP 103/66, P 72, R 18. Client oriented x 3. Instructed to call staff for assistance. R: Client assisted with hygiene care, clothes changed and put back to bed.

## 2015-07-16 NOTE — Progress Notes (Signed)
D: Met with pt with 1:1 staff present.  Pt has depressed affect and mood.  When asked how his day was, pt states "it was miserable.  I just think about how bad things are now and how bad they are in the future."  Pt was asked to elaborate and he stated "thinking about someone trying to force me to go to a nursing home, but I'm too young for that."  Pt denies SI/HI, denies hallucinations, denies pain.  He reports his goal is "to feel better."  Pt is mildly confused, Camera operator the same question multiple times. Pt attended evening group.  He has used his walker to ambulate at times.  When not using his walker, he has a gait belt on for safety.    A: Actively listened to pt.  Encouraged and supported pt.  Medications administered per order.  Pt being monitored 1:1 for safety per order.   R: Pt is compliant with medications.  He verbally contracts for safety and reports that he will notify staff of needs and concerns.  1:1 staff remains with pt for safety.

## 2015-07-16 NOTE — Progress Notes (Addendum)
Pt was placed on a 1:1 at 8am for safety and intermittent  confusion . Presently he is in the dayroom with a sitter and with the other pts. He does contract for safety and continues to be fixated on his bowels. Pt does have BS right upper and lower quadrants and left upper and lower are hypoactive. Abd is nontender and pt does not appear in distress with palpation. He stated,"I am not passing any gas." MD  made aware of pts current condition. Pt appears to have decompensated from yesterday and is very dishelved. He has been encouraged to wash up and stated,"I have not taken a bath in over 10 years. I am just like my father. " Pt did eat 100% of his breakfast in the dayroom and requested 2 milks and more bacon. Pts room was a mess with torn pages from the bible all over and old paper scrubs ripped and thrown on the floor. He is alert and oriented times three but is quite unsteady on his feet now(9:30am)MD. Dr Adele Schilder , evaluated the pt. Pt is lucid but was placed in a wheelchair with a gait belt . Per MD pt is to go to the ER for a belly film if no BM in six hours.Marland KitchenCharge nurse made aware. A new dressing was placed on a blister on pts foot between the left great toe and the second toe. The blister is intact. Pts clonopin was held at 12noon due to pt being unsteady on his feet and groggy. 3:30pm_pt is in the bed resting and talking to one of the nurses.5p-Pts 5pm Klonopin was held . He appears now more lucid and more steady. Pt continues to use a walker. He did have a small BM earlier. 5:30p-Pt remains a 1:1. Pt shared that he has ordered a mask and tubing to hook up to helium to kill himself. Pt admitted he has only been eating breakfast and will not ever eat lunch or dinner. NP made aware. Pt stated he would not drink ensure either. He stated,"I am tired of being depressed."

## 2015-07-16 NOTE — Progress Notes (Signed)
Patient ID: HASHEM GOYNES, male   DOB: Dec 28, 1955, 59 y.o.   MRN: [id number]   D: Client up in room urinating in the toilet, informed staff he would not go to BR because "there are snakes in the toilet"A: client redirected by staff. R: Client went back to bed.

## 2015-07-17 LAB — GLUCOSE, CAPILLARY
Glucose-Capillary: 95 mg/dL (ref 65–99)
Glucose-Capillary: 96 mg/dL (ref 65–99)
Glucose-Capillary: 120 mg/dL — ABNORMAL HIGH (ref 65–99)

## 2015-07-17 NOTE — Progress Notes (Signed)
Patient ID: [name], male   DOB: [birth year], 59 y.o.   MRN: [id number] D ---- Pt. Continues to complain of constipation with one small bowel movement today.  He reports that Barium enima has worked well in the past.   Pt. Said [name]  has not worked for him and Myralax is not working as well as it used to.   Pt. [name] he is willing to try an enima for relief.

## 2015-07-17 NOTE — Progress Notes (Signed)
Patient ID: DUC CROCKET, male   DOB: 31-May-1956, 59 y.o.   MRN: [id number] NURSE  NOTE  ---  10000 hrs. , 07/16/15 ---  Pt. Remains on 1:1 observation for pt. Safety due to high fall risk.  Pt. Is app/coop and agrees to contract for safety.   He makes no complaints of pain greater than his " usual amount ".   He is friendly and receptive to staff and 1:1 sitter.  He ambulates wit a walker or gait belt.  He ambulates with a walker to and from bathroom and dayroom with sitter present.   Pt. Maintains an odd type of smile  With poor eye contact when talking to staff.  Pt. Attended group this AM.  --- A --  Support , medications and 1:1 observation as ordered.  --- R --  Pt. Remain safe and has had no falls during this past 4 hour time period.

## 2015-07-17 NOTE — [facility] Group Notes (Signed)
[facility] Group Notes: (Clinical Social Work)   07/17/2015      Type of Therapy:  Group Therapy   Participation Level:  Did Not Attend despite MHT prompting   Selmer Dominion, LCSW 07/17/2015, 1:07 PM

## 2015-07-17 NOTE — Progress Notes (Signed)
1:1 note: D: Pt is resting in his bed with his eyes closed.  Respirations are even and unlabored.  A: Pt continues to be monitored 1:1 for safety.   R: No distress noted.  Prior to going to bed, he reported that he would notify staff of any needs or concerns. Will continue to monitor and assess for safety.

## 2015-07-17 NOTE — Progress Notes (Signed)
1:1 note: D: Pt is resting in his bed with his eyes closed.  Respirations are even and unlabored.   A: Pt continues to be monitored 1:1 for safety. R: No distress noted.  Will continue to monitor 1:1 for safety per order.

## 2015-07-17 NOTE — Progress Notes (Signed)
[facility] MD Progress Note  07/17/2015 12:28 PM [name]  MRN:  [id number]   Subjective: My thinking is getting clearer.   Objective Patient seen chart reviewed.  Yesterday his medications were reduced because he was confused, rambling and appeared delirious.  Today patient appears somewhat clear and he reported that his attention is better.  He do not recall that he was confused yesterday.  He still complaining of constipation but his abdominal pain is less from the past.  He still remains on fall precaution.  He still have balance issue.  He continued to endorse depression with feelings of hopelessness and worthlessness.  He had thought about ECT but he is not interested but like to explore more about [facility] treatment.  He denies any hallucination or any paranoia.  His energy level remains very low.  He is able to follow instruction from the staff.  His comprehensive metabolic panel is normal however his amitriptyline level is still pending.  We have reduced the dose of Klonopin, Seroquel and amitriptyline yesterday.  Patient tolerating well his reduced dosed and does not appear to be very sedated today.   Principal Problem: Schizoaffective disorder, depressive type with good prognostic features Diagnosis:   Patient Active Problem List   Diagnosis Date Noted  . Schizoaffective disorder, depressive type with good prognostic features [F25.1] 07/13/2015  . Abdominal pain [R10.9]   . Constipation [K59.00]   . Verruca [B07.9] 05/18/2015  . Seborrheic dermatitis of scalp [L21.8] 05/18/2015  . Blood poisoning [A41.9]   . MDD (major depressive disorder), recurrent severe, without psychosis [F33.2]   . OCD (obsessive compulsive disorder) [F42]   . Confusion [R41.0] 12/31/2014  . Seizures [R56.9]   . Schizoaffective disorder, depressive type [F25.1]   . Orthostatic hypotension [I95.1]   . Hyperlipidemia [E78.5]   . Falls [phone]]   . HLD (hyperlipidemia) [E78.5]   . Seizure [R56.9]  11/18/2014  . Schizoaffective disorder, unspecified type [F25.9]   . Essential hypertension [I10]   . Overdose of beta-adrenergic antagonist drug [T44.7X1A] 10/22/2014  . Altered mental status [R41.82]   . Overdose [T50.901A]   . Nocturnal enuresis [N39.44] 03/23/2014  . Benzodiazepine dependence, continuous [F13.20] 01/27/2014  . Opiate dependence [F11.20] 01/22/2014  . Severe major depression [F32.2] 01/21/2014  . Frozen shoulder syndrome [M75.00] 09/20/2013  . Somnolence [R40.0] 09/20/2013  . Schizoaffective disorder [F25.9] 09/20/2013  . Malaise and fatigue [R53.81, R53.83] 04/21/2013  . Presbyopia [H52.4] 04/25/2012  . Androgen deficiency [E29.1] 09/07/2011  . Constipation, chronic [K59.00] 09/07/2011  . Other specified disorders of liver [K76.89] 12/30/2008  . Overweight(278.02) [E66.3] 09/08/2008  . HYPERLIPIDEMIA [E78.5] 01/28/2008  . HYPERTENSION [I10] 01/28/2008  . Seizure disorder [G40.909] 01/28/2008  . OSTEOPENIA [M89.9, M94.9] 07/01/2007   Total Time spent with patient: 25 minutes   Past Medical History:  Past Medical History  Diagnosis Date  . Seizures   . Hypertension   . GERD (gastroesophageal reflux disease)   . Dyslipidemia   . Schizoaffective disorder   . Depression   . Anxiety   . MRSA carrier   . Constipation   . Memory loss   . Hearing loss     Past Surgical History  Procedure Laterality Date  . Self orchectomy    . Colonoscopy  2010  . Orif shoulder fracture    . Shoulder closed reduction Left 09/16/2013    Procedure: CLOSED REDUCTION SHOULDER;  Surgeon: Mauri Pole, MD;  Location: [facility];  Service: Orthopedics;  Laterality: Left;  . Shoulder hemi-arthroplasty  Left 09/18/2013    Procedure: LEFT SHOULDER HEMI-ARTHROPLASTY;  Surgeon: Augustin Schooling, MD;  Location: [facility];  Service: Orthopedics;  Laterality: Left;  . Carpal tunnel release     Family History:  Family History  Problem Relation Age of Onset  . Stroke Father     Living at 15   . Stroke Mother     Stroke in late 35's. Died in her 60's   Social History:  History  Alcohol Use No     History  Drug Use No    History   Social History  . Marital Status: Single    Spouse Name: N/A  . Number of Children: 0  . Years of Education: 18   Occupational History  . disabled    Social History Main Topics  . Smoking status: Never Smoker   . Smokeless tobacco: Never Used  . Alcohol Use: No  . Drug Use: No  . Sexual Activity: No   Other Topics Concern  . None   Social History Narrative   Patient does not drink caffeine.   Patient is left handed.   Additional History:    Sleep: Fair  Appetite:  Fair   Assessment: See above  Musculoskeletal: Strength & Muscle Tone: within normal limits Gait & Station: Antalgic with intentional change in gait due to blister  Patient leans: N/A   Psychiatric Specialty Exam: Physical Exam  Review of Systems  Constitutional: Negative.  Negative for fever, weight loss and malaise/fatigue.  HENT: Negative.  Negative for congestion and sore throat.   Eyes: Negative.  Negative for blurred vision, discharge and redness.  Respiratory: Negative.  Negative for cough and wheezing.   Cardiovascular: Negative.  Negative for chest pain and palpitations.  Gastrointestinal: Positive for constipation. Negative for heartburn, nausea, vomiting, abdominal pain and diarrhea.  Genitourinary: Negative.  Negative for dysuria.  Musculoskeletal: Negative.  Negative for myalgias.  Skin: Negative for rash.  Neurological: Negative.  Negative for dizziness, seizures, loss of consciousness, weakness and headaches.  Endo/Heme/Allergies: Negative.  Negative for environmental allergies.  Psychiatric/Behavioral: Positive for depression. Negative for suicidal ideas. The patient is nervous/anxious and has insomnia.   All other systems reviewed and are negative.   Blood pressure 100/58, pulse 70, temperature 97.5 F (36.4 C), temperature source  Oral, resp. rate 16, height 5' 11"  (1.803 m), weight 89.812 kg (198 lb).Body mass index is 27.63 kg/(m^2).  General Appearance: Disheveled and Fairly Groomed  Engineer, water::  Fair  Speech:  Normal Rate  Volume:  Normal  Mood:  Anxious  Affect:  Appropriate and Congruent  Thought Process:  Circumstantial  Orientation:  Full (Time, Place, and Person)  Thought Content:  Obsessions and Rumination  Suicidal Thoughts:  No  Homicidal Thoughts:  No  Memory:  Immediate;   Fair Recent;   Fair Remote;   Fair  Judgement:  Impaired  Insight:  Lacking  Psychomotor Activity:  Decreased  Concentration:  Good  Recall:  Poor  Fund of Knowledge:Fair  Language: Fair  Akathisia:  No   Handed:    AIMS (if indicated):     Assets:  Communication Skills Desire for Improvement Resilience Social Support  ADL's:  Intact  Cognition: WNL  Sleep:  Number of Hours: 5.75     Current Medications: Current Facility-Administered Medications  Medication Dose Route Frequency Provider Last Rate Last Dose  . acetaminophen (TYLENOL) tablet 650 mg  650 mg Oral Q6H PRN Laverle Hobby, PA-C   650 mg at 07/15/15 0758  .  alum & mag hydroxide-simeth (MAALOX/MYLANTA) 200-200-20 MG/5ML suspension 30 mL  30 mL Oral Q4H PRN Laverle Hobby, PA-C      . amitriptyline (ELAVIL) tablet 300 mg  300 mg Oral QHS Kathlee Nations, MD   300 mg at 07/16/15 2131  . aspirin EC tablet 81 mg  81 mg Oral Daily Laverle Hobby, PA-C   81 mg at 07/17/15 1517  . clonazePAM (KLONOPIN) tablet 2 mg  2 mg Oral TID Kathlee Nations, MD   2 mg at 07/17/15 1149  . hydrOXYzine (ATARAX/VISTARIL) tablet 50 mg  50 mg Oral Q6H PRN Hampton Abbot, MD   50 mg at 07/15/15 1452  . lacosamide (VIMPAT) tablet 150 mg  150 mg Oral BID Laverle Hobby, PA-C   150 mg at 07/17/15 6160  . magnesium hydroxide (MILK OF MAGNESIA) suspension 30 mL  30 mL Oral Daily PRN Laverle Hobby, PA-C   30 mL at 07/14/15 1030  . metoprolol succinate (TOPROL-XL) 24 hr tablet 50 mg  50  mg Oral Daily Laverle Hobby, PA-C   50 mg at 07/17/15 7371  . phenytoin (DILANTIN) ER capsule 300 mg  300 mg Oral QHS Laverle Hobby, PA-C   300 mg at 07/16/15 2132  . polyethylene glycol (MIRALAX / GLYCOLAX) packet 17 g  17 g Oral BID Kerrie Buffalo, NP   17 g at 07/17/15 0810  . QUEtiapine (SEROQUEL) tablet 600 mg  600 mg Oral QHS Kathlee Nations, MD   600 mg at 07/16/15 2131  . senna-docusate (Senokot-S) tablet 1 tablet  1 tablet Oral QHS Laverle Hobby, PA-C   1 tablet at 07/16/15 2132  . simvastatin (ZOCOR) tablet 40 mg  40 mg Oral QHS Laverle Hobby, PA-C   40 mg at 07/16/15 2132  . testosterone cypionate (DEPOTESTOSTERONE CYPIONATE) injection 400 mg  400 mg Intramuscular Q14 Days Laverle Hobby, PA-C   400 mg at 07/13/15 1226    Lab Results:  Results for orders placed or performed during the hospital encounter of 07/13/15 (from the past 48 hour(s))  Glucose, capillary     Status: None   Collection Time: 07/15/15  4:51 PM  Result Value Ref Range   Glucose-Capillary 99 65 - 99 mg/dL  Glucose, capillary     Status: Abnormal   Collection Time: 07/16/15  6:03 AM  Result Value Ref Range   Glucose-Capillary 101 (H) 65 - 99 mg/dL   Comment 1 Notify RN   Glucose, capillary     Status: Abnormal   Collection Time: 07/16/15 11:57 AM  Result Value Ref Range   Glucose-Capillary 106 (H) 65 - 99 mg/dL  Glucose, capillary     Status: None   Collection Time: 07/16/15  4:52 PM  Result Value Ref Range   Glucose-Capillary 88 65 - 99 mg/dL  Comprehensive metabolic panel     Status: Abnormal   Collection Time: 07/16/15  7:20 PM  Result Value Ref Range   Sodium 140 135 - 145 mmol/L   Potassium 4.3 3.5 - 5.1 mmol/L   Chloride 106 101 - 111 mmol/L   CO2 26 22 - 32 mmol/L   Glucose, Bld 91 65 - 99 mg/dL   BUN 17 6 - 20 mg/dL   Creatinine, Ser 1.08 0.61 - 1.24 mg/dL   Calcium 8.8 (L) 8.9 - 10.3 mg/dL   Total Protein 6.9 6.5 - 8.1 g/dL   Albumin 3.8 3.5 - 5.0 g/dL   AST 17 15 -  41 U/L   ALT  18 17 - 63 U/L   Alkaline Phosphatase 88 38 - 126 U/L   Total Bilirubin 0.3 0.3 - 1.2 mg/dL   GFR calc non Af Amer >60 >60 mL/min   GFR calc Af Amer >60 >60 mL/min    Comment: (NOTE) The eGFR has been calculated using the CKD EPI equation. This calculation has not been validated in all clinical situations. eGFR's persistently <60 mL/min signify possible Chronic Kidney Disease.    Anion gap 8 5 - 15    Comment: Performed at [facility]  Glucose, capillary     Status: None   Collection Time: 07/17/15  6:07 AM  Result Value Ref Range   Glucose-Capillary 95 65 - 99 mg/dL  Glucose, capillary     Status: None   Collection Time: 07/17/15 11:59 AM  Result Value Ref Range   Glucose-Capillary 96 65 - 99 mg/dL   Comment 1 Notify RN     Physical Findings: AIMS: Facial and Oral Movements Muscles of Facial Expression: None, normal Lips and Perioral Area: None, normal Jaw: None, normal Tongue: None, normal,Extremity Movements Upper (arms, wrists, hands, fingers): None, normal Lower (legs, knees, ankles, toes): None, normal, Trunk Movements Neck, shoulders, hips: None, normal, Overall Severity Severity of abnormal movements (highest score from questions above): None, normal Incapacitation due to abnormal movements: None, normal Patient's awareness of abnormal movements (rate only patient's report): No Awareness, Dental Status Current problems with teeth and/or dentures?: No Does patient usually wear dentures?: No  CIWA:    COWS:     Treatment Plan Summary: Daily contact with patient to assess and evaluate symptoms and progress in treatment and Medication management   Medications: -Reviewed current medication , patient cognition is improved from the past.  He is less sedated and more alert.  However he still have balance issues and requires 1:1 for fall precaution. Amitriptyline level still pending. Continue present dose of Seroquel, Klonopin and amitriptyline.   His  abdominal pain is less intense from the past however we will also closely monitor his bowel movements and if no relief we will consider setting into the emergency room for medical clearance.  Patient has EKG which is unchanged from the past.   Medical Decision Making:  Review or order clinical lab tests (1), Review of Last Therapy Session (1), Review of Medication Regimen & Side Effects (2) and Review of New Medication or Change in Dosage (2)   Anjalee Cope T., MD 07/17/2015,

## 2015-07-17 NOTE — [facility] Group Notes (Signed)
[facility] Group Notes:  (Nursing/MHT/Case Management/Adjunct)  Date:  07/17/2015  Time:  10:30 AM  Type of Therapy:  Psychoeducational Skills  Participation Level:  Active  Participation Quality:  Appropriate  Affect:  Appropriate  Cognitive:  Appropriate  Insight:  Appropriate  Engagement in Group:  Engaged  Modes of Intervention:  Discussion  Summary of Progress/Problems: Pt did attend self inventory group, pt reported that he was negative SI/HI, no AH/VH noted. Pt rated his depression as a 8, and his helplessness/hopelessness as a 8.     Pt reported concerns about not having a bowel movement, pt advised that the doctor will be made aware. Pt reported that her healthy support system was his girlfriend.    Benancio Deeds Shanta 07/17/2015, 10:30 AM

## 2015-07-17 NOTE — Progress Notes (Signed)
1:1 note: D: Pt has depressed affect and mood.  He had a visitor today and reports that his visit was "okay."  He reports he did not have a good day and stated "I just wake up and I just feel.. Another day."  Pt denies SI/HI at this time.  He was asked if he may be suicidal after discharge and he stated "might if I'm depressed.  I already got the equipment already."  Pt denies hallucinations, denies pain.  He has been pacing the hall with his walker at times.  Pt attended evening group.  A: Met with pt with 1:1 staff present.  Encouraged, supported, and actively listened to pt.  Medications administered per order.   R: Pt is compliant with medications.  He was hesitant to contract for safety.  Initially he stated "I don't need to."  Pt then stated "I can contract."  He reports he will notify staff of needs and concerns.  Will continue to monitor 1:1 per order for safety.

## 2015-07-17 NOTE — Progress Notes (Signed)
Adult Psychoeducational Group Note  Date:  07/17/2015 Time:  9:09 PM  Group Topic/Focus:  Wrap-Up Group:   The focus of this group is to help patients review their daily goal of treatment and discuss progress on daily workbooks.  Participation Level:  Minimal  Participation Quality:  Appropriate  Affect:  Appropriate  Cognitive:  Lacking  Insight: Lacking  Engagement in Group:  Engaged  Modes of Intervention:  Discussion  Additional Comments: The patient expressed that he still feels extremely depressed.The patient also received support and ideas to make him feel better.  Nash Shearer 07/17/2015, 9:09 PM

## 2015-07-17 NOTE — Progress Notes (Signed)
Patient ID: [name], male   DOB: [birth year], 59 y.o.   MRN: [id number] NURSE  NOTE  --- 1400 Hr. , 07/17/15  ---  Pt. remains on 1:1 observation for safety due to high fall risk . He has stayed in his room and to himself most of time since last Nurse note.    Pt. Has minimal interaction with peers or staff.  Pt. Denies pain or dis-comfort and agrees to contract for safety.  He continues to state  possibility of suicide after he goes home.   Pt. Has had no falls  During this time 4 Hr.  Period.  --- A --  Maintain pt. Safety  --- R ---  Pt. Safe at this time

## 2015-07-17 NOTE — Progress Notes (Addendum)
Patient ID: [name], male   DOB: 04/27/1956, 59 y.o.   MRN: [id number] NURSE  NOTE  ---  1800 Hr. , 07/17/15 ---  Pt. Remains on 1:1 observation for pt. Safety due to fall risk.   Pt. Remains in his room lying on bed reading.  He has minimal conversation with sitter or other staff.  He denies pain or dis-comfort and agrees to contract for safety while at [facility].   He continues to complain of constipation  And  Asks for Dr. To order an Enima for relief.    Pt. ambulated the hall breifly with siiter at his side.  He has had no falls during this 4 hour period.  --- A ---  Support and 1:1 observation for safety continued  --- R ---  Pt. Safe and plesant at this tiome

## 2015-07-18 LAB — GLUCOSE, CAPILLARY
Glucose-Capillary: 101 mg/dL — ABNORMAL HIGH (ref 65–99)
Glucose-Capillary: 96 mg/dL (ref 65–99)

## 2015-07-18 NOTE — Progress Notes (Signed)
D: Patient is alert but drowsy at times. Pt denies HI and AVH. Pt reports passive SI, states "I'd be at peace." Pt's mood and affect is depressed, sad, and flat. Pt minimally engages with RN. Pt is unsteady when ambulating and uses front wheel walker per providers order. Pt is requesting additional laxative, pt reports small BM yesterday. Pt is observed ambulating the halls at times today, pt states "I need the exercise, I think it will help with my depression, I'm in a bad habit of staying in the bed too much." Pt is attending some unit groups today. A: Pt remains on 1:1 status per providers orders, MHT with pt at all times, see additional notes at 1000, 1400, and 1800. Active listening by RN. Encouragement/Support provided to pt. Pt encouraged to exercise in moderation. Fall precautions/prevention reviewed with pt. Scheduled medications administered per providers orders (See MAR). 15 minute checks continued per protocol for patient safety.  R: Patient cooperative and receptive to nursing interventions. Pt remains safe. Pt verbally contracts for safety, agrees not to harm self and agrees to come to staff with increased intensity of suicidal thoughts.

## 2015-07-18 NOTE — Progress Notes (Signed)
1:1 NOTE: D: Patient is alert and oriented. Pt is observed sitting up in bed eating dinner meal. Pt is requesting dessert, denies having any additional concerns at this time. A: Pt remains on 1:1 status per providers orders, MHT with pt at all times. Active listening by RN. Encouragement/Support provided to pt. 15 minute checks continued per protocol for patient safety.  R: Pt remains safe.

## 2015-07-18 NOTE — Progress Notes (Signed)
Patient ID: [name], male   DOB: 12-18-1955, 59 y.o.   MRN: [id number]  D: Patient continues to talk about his continued depression. Reports that it is not getting any better. Patient wanting to try a new medication but seems to be over- medicated with home meds. His walking has been unsteady at times according to technicians. Complying with using gait belt when walking in hall tonight. A: Staff will monitor on 1:1 for safety, follow treatment plan, and give meds as ordered. R: Cooperative on the unit

## 2015-07-18 NOTE — Progress Notes (Signed)
1:1 note: D: Pt is resting in his bed with his eyes closed. Respirations are even and unlabored.  A: Pt continues to be monitored 1:1 for safety.  PRN medication was administered for anxiety earlier this morning, see flow sheet. R:  Pt was compliant with PRN medication administration. No distress noted. Will continue to monitor 1:1 for safety per order.

## 2015-07-18 NOTE — Progress Notes (Signed)
1:1 NOTE: D: Patient is alert and oriented. Pt denies having concerns at this time. Pt requests to ambulate to the dayroom for group therapy. Pt using walker. Pt observed to be unsteady and needing assistance. Pt appears to be in no acute distress at this time and begins enjoying a snack once sitting in dayroom. A: Pt remains on 1:1 status per providers orders, MHT with pt at all times. Pt encouraged to use wheelchair throughout the day and if desiring to walk-pt encouraged to use/wear gait belt as well as walker. Gait belt brought to pt in dayroom. Fall precautions/prevention reviewed with pt. Active listening by RN. Encouragement/Support provided to pt. 15 minute checks continued per protocol for patient safety.  R: Patient cooperative and receptive to nursing interventions. Pt remains safe.

## 2015-07-18 NOTE — Plan of Care (Signed)
Problem: Ineffective individual coping Goal: STG: Patient will remain free from self harm Outcome: Progressing Patient remains free from self harm. 15 minute checks continued per protocol for patient safety.   Problem: Alteration in mood Goal: LTG-Patient reports reduction in suicidal thoughts (Patient reports reduction in suicidal thoughts and is able to verbalize a safety plan for whenever patient is feeling suicidal)  Outcome: Not Progressing Patient continues to endorse having suicidal thoughts today. Pt is able to verbally contract for safety with RN.  Problem: Diagnosis: Increased Risk For Suicide Attempt Goal: STG-Patient Will Comply With Medication Regime Outcome: Progressing Patient has adhered to medication regimen today with ease.

## 2015-07-18 NOTE — [facility] Group Notes (Signed)
[facility] LCSW Aftercare Discharge Planning Group Note   07/18/2015 10:34 AM Patient was invited to attend group today but declined.     [name]

## 2015-07-18 NOTE — Plan of Care (Signed)
Problem: Diagnosis: Increased Risk For Suicide Attempt Goal: STG-Patient Will Report Suicidal Feelings to Staff Outcome: Progressing Pt reported passive SI to writer tonight.  He denied SI at [facility], but states he may be suicidal after discharge.  He verbally contracted for safety with Probation officer.

## 2015-07-18 NOTE — Progress Notes (Signed)
1:1 NOTE: D: Patient is alert and oriented. Pt is observed ambulating the hallway with walker and gait belt at this time. Pt reports he is hopeful that exercise will help decrease his depression. Pt denies having any concerns at this time. Pt brightens upon interaction. A: Pt remains on 1:1 status per providers orders, MHT with pt at all times. Active listening by RN. Encouragement/Support provided to pt. 15 minute checks continued per protocol for patient safety.  R: Patient cooperative and receptive to nursing interventions. Pt remains safe.

## 2015-07-18 NOTE — [facility] Group Notes (Signed)
[place] LCSW Group Therapy  07/18/2015 3:24 PM  Type of Therapy:  Group Therapy  Participation Level:  Did Not Attend   Today's Topic: Overcoming Obstacles. Patients identified one short term goal and potential obstacles in reaching this goal. Patients processed barriers involved in overcoming these obstacles. Patients identified steps necessary for overcoming these obstacles and explored motivation (internal and external) for facing these difficulties head on. Today's Topic: Overcoming Obstacles. Patients identified one short term goal and potential obstacles in reaching this goal. Patients processed barriers involved in overcoming these obstacles. Patients identified steps necessary for overcoming these obstacles and explored motivation (internal and external) for facing these difficulties head on.      [name] 07/18/2015, 3:24 PM

## 2015-07-18 NOTE — Progress Notes (Signed)
1:1 note: D: Pt is resting in his room with eyes closed.  Respirations are even and unlabored.  Pt appears to be asleep. A: 1:1 monitoring continues per order for safety. R: No distress noted.  Pt will continue to be monitored 1:1 per order.

## 2015-07-18 NOTE — Progress Notes (Signed)
[facility] MD Progress Note  07/18/2015 1:48 PM [name]  MRN:  [id number]   Subjective: "I'm still depressed"  Objective Patient seen chart reviewed.  Over weekend, his medications were reduced because he was confused, rambling and appeared delirious.  Today patient appears somewhat clear and he reported that his attention is better.He still complaining of constipation and dry mouthm but his abdominal pain is less from the past.  He still remains on fall precaution.  He still have balance issue.  He continued to endorse depression with feelings of hopelessness and worthlessness.  He had thought about ECT but he is not interested but like to explore more about [place] treatment.  He denies any hallucination or any paranoia.  His energy level remains very low.  He is able to follow instruction from the staff.  His comprehensive metabolic panel is normal however his amitriptyline level is still pending.  We have reduced the dose of Klonopin, Seroquel and amitriptyline over weekend.  Patient tolerating well his reduced dosed and does not appear to be very sedated today. Pt reports a history of side effects to SSRIs, so is considering starting a new antidepressant, but cannot recall the name of it.  Principal Problem: Schizoaffective disorder, depressive type with good prognostic features Diagnosis:   Patient Active Problem List   Diagnosis Date Noted  . Schizoaffective disorder, depressive type with good prognostic features [F25.1] 07/13/2015  . Abdominal pain [R10.9]   . Constipation [K59.00]   . Verruca [B07.9] 05/18/2015  . Seborrheic dermatitis of scalp [L21.8] 05/18/2015  . Blood poisoning [A41.9]   . MDD (major depressive disorder), recurrent severe, without psychosis [F33.2]   . OCD (obsessive compulsive disorder) [F42]   . Confusion [R41.0] 12/31/2014  . Seizures [R56.9]   . Schizoaffective disorder, depressive type [F25.1]   . Orthostatic hypotension [I95.1]   . Hyperlipidemia [E78.5]    . Falls [phone].XXXA]   . HLD (hyperlipidemia) [E78.5]   . Seizure [R56.9] 11/18/2014  . Schizoaffective disorder, unspecified type [F25.9]   . Essential hypertension [I10]   . Overdose of beta-adrenergic antagonist drug [T44.7X1A] 10/22/2014  . Altered mental status [R41.82]   . Overdose [T50.901A]   . Nocturnal enuresis [N39.44] 03/23/2014  . Benzodiazepine dependence, continuous [F13.20] 01/27/2014  . Opiate dependence [F11.20] 01/22/2014  . Severe major depression [F32.2] 01/21/2014  . Frozen shoulder syndrome [M75.00] 09/20/2013  . Somnolence [R40.0] 09/20/2013  . Schizoaffective disorder [F25.9] 09/20/2013  . Malaise and fatigue [R53.81, R53.83] 04/21/2013  . Presbyopia [H52.4] 04/25/2012  . Androgen deficiency [E29.1] 09/07/2011  . Constipation, chronic [K59.00] 09/07/2011  . Other specified disorders of liver [K76.89] 12/30/2008  . Overweight(278.02) [E66.3] 09/08/2008  . HYPERLIPIDEMIA [E78.5] 01/28/2008  . HYPERTENSION [I10] 01/28/2008  . Seizure disorder [G40.909] 01/28/2008  . OSTEOPENIA [M89.9, M94.9] 07/01/2007   Total Time spent with patient: 25 minutes   Past Medical History:  Past Medical History  Diagnosis Date  . Seizures   . Hypertension   . GERD (gastroesophageal reflux disease)   . Dyslipidemia   . Schizoaffective disorder   . Depression   . Anxiety   . MRSA carrier   . Constipation   . Memory loss   . Hearing loss     Past Surgical History  Procedure Laterality Date  . Self orchectomy    . Colonoscopy  2010  . Orif shoulder fracture    . Shoulder closed reduction Left 09/16/2013    Procedure: CLOSED REDUCTION SHOULDER;  Surgeon: Mauri Pole, MD;  Location:  [facility];  Service: Orthopedics;  Laterality: Left;  . Shoulder hemi-arthroplasty Left 09/18/2013    Procedure: LEFT SHOULDER HEMI-ARTHROPLASTY;  Surgeon: Augustin Schooling, MD;  Location: [facility];  Service: Orthopedics;  Laterality: Left;  . Carpal tunnel release     Family History:  Family  History  Problem Relation Age of Onset  . Stroke Father     Living at 3  . Stroke Mother     Stroke in late 4's. Died in her 28's   Social History:  History  Alcohol Use No     History  Drug Use No    History   Social History  . Marital Status: Single    Spouse Name: N/A  . Number of Children: 0  . Years of Education: 18   Occupational History  . disabled    Social History Main Topics  . Smoking status: Never Smoker   . Smokeless tobacco: Never Used  . Alcohol Use: No  . Drug Use: No  . Sexual Activity: No   Other Topics Concern  . None   Social History Narrative   Patient does not drink caffeine.   Patient is left handed.   Additional History:    Sleep: Fair  Appetite:  Fair   Assessment: See above  Musculoskeletal: Strength & Muscle Tone: within normal limits Gait & Station: Antalgic with intentional change in gait due to blister  Patient leans: N/A   Psychiatric Specialty Exam: Physical Exam  Review of Systems  Constitutional: Negative.  Negative for fever, weight loss and malaise/fatigue.  HENT: Negative.  Negative for congestion and sore throat.   Eyes: Negative.  Negative for blurred vision, discharge and redness.  Respiratory: Negative.  Negative for cough and wheezing.   Cardiovascular: Negative.  Negative for chest pain and palpitations.  Gastrointestinal: Positive for constipation. Negative for heartburn, nausea, vomiting, abdominal pain and diarrhea.  Genitourinary: Negative.  Negative for dysuria.  Musculoskeletal: Negative.  Negative for myalgias.  Skin: Negative for rash.  Neurological: Negative.  Negative for dizziness, seizures, loss of consciousness, weakness and headaches.  Endo/Heme/Allergies: Negative.  Negative for environmental allergies.  Psychiatric/Behavioral: Positive for depression. Negative for suicidal ideas. The patient is nervous/anxious and has insomnia.   All other systems reviewed and are negative.   Blood  pressure 102/62, pulse 73, temperature 97.6 F (36.4 C), temperature source Oral, resp. rate 16, height 5' 11"  (1.803 m), weight 89.812 kg (198 lb).Body mass index is 27.63 kg/(m^2).  General Appearance: Disheveled and Fairly Groomed  Engineer, water::  Fair  Speech:  Normal Rate  Volume:  Normal  Mood:  Anxious, depressed  Affect:  Appropriate and Congruent  Thought Process:  Circumstantial  Orientation:  Full (Time, Place, and Person)  Thought Content:  Obsessions and Rumination  Suicidal Thoughts:  No  Homicidal Thoughts:  No  Memory:  Immediate;   Fair Recent;   Fair Remote;   Fair  Judgement:  Impaired  Insight:  Lacking  Psychomotor Activity:  Decreased  Concentration:  Good  Recall:  Poor  Fund of Knowledge:Fair  Language: Fair  Akathisia:  No   Handed:    AIMS (if indicated):     Assets:  Communication Skills Desire for Improvement Resilience Social Support  ADL's:  Intact  Cognition: WNL  Sleep:  Number of Hours: 5.75     Current Medications: Current Facility-Administered Medications  Medication Dose Route Frequency Provider Last Rate Last Dose  . acetaminophen (TYLENOL) tablet 650 mg  650 mg Oral  Q6H PRN Laverle Hobby, PA-C   650 mg at 07/15/15 0758  . alum & mag hydroxide-simeth (MAALOX/MYLANTA) 200-200-20 MG/5ML suspension 30 mL  30 mL Oral Q4H PRN Laverle Hobby, PA-C      . amitriptyline (ELAVIL) tablet 300 mg  300 mg Oral QHS Kathlee Nations, MD   300 mg at 07/17/15 2212  . aspirin EC tablet 81 mg  81 mg Oral Daily Laverle Hobby, PA-C   81 mg at 07/18/15 6962  . clonazePAM (KLONOPIN) tablet 2 mg  2 mg Oral TID Kathlee Nations, MD   2 mg at 07/18/15 1200  . hydrOXYzine (ATARAX/VISTARIL) tablet 50 mg  50 mg Oral Q6H PRN Hampton Abbot, MD   50 mg at 07/18/15 0012  . lacosamide (VIMPAT) tablet 150 mg  150 mg Oral BID Laverle Hobby, PA-C   150 mg at 07/18/15 0836  . magnesium hydroxide (MILK OF MAGNESIA) suspension 30 mL  30 mL Oral Daily PRN Laverle Hobby,  PA-C   30 mL at 07/14/15 1030  . metoprolol succinate (TOPROL-XL) 24 hr tablet 50 mg  50 mg Oral Daily Laverle Hobby, PA-C   50 mg at 07/18/15 0836  . phenytoin (DILANTIN) ER capsule 300 mg  300 mg Oral QHS Laverle Hobby, PA-C   300 mg at 07/17/15 2212  . polyethylene glycol (MIRALAX / GLYCOLAX) packet 17 g  17 g Oral BID Kerrie Buffalo, NP   17 g at 07/18/15 0835  . QUEtiapine (SEROQUEL) tablet 600 mg  600 mg Oral QHS Kathlee Nations, MD   600 mg at 07/17/15 2212  . senna-docusate (Senokot-S) tablet 1 tablet  1 tablet Oral QHS Laverle Hobby, PA-C   1 tablet at 07/17/15 2212  . simvastatin (ZOCOR) tablet 40 mg  40 mg Oral QHS Laverle Hobby, PA-C   40 mg at 07/17/15 2212  . testosterone cypionate (DEPOTESTOSTERONE CYPIONATE) injection 400 mg  400 mg Intramuscular Q14 Days Laverle Hobby, PA-C   400 mg at 07/13/15 1226    Lab Results:  Results for orders placed or performed during the hospital encounter of 07/13/15 (from the past 48 hour(s))  Glucose, capillary     Status: None   Collection Time: 07/16/15  4:52 PM  Result Value Ref Range   Glucose-Capillary 88 65 - 99 mg/dL  Comprehensive metabolic panel     Status: Abnormal   Collection Time: 07/16/15  7:20 PM  Result Value Ref Range   Sodium 140 135 - 145 mmol/L   Potassium 4.3 3.5 - 5.1 mmol/L   Chloride 106 101 - 111 mmol/L   CO2 26 22 - 32 mmol/L   Glucose, Bld 91 65 - 99 mg/dL   BUN 17 6 - 20 mg/dL   Creatinine, Ser 1.08 0.61 - 1.24 mg/dL   Calcium 8.8 (L) 8.9 - 10.3 mg/dL   Total Protein 6.9 6.5 - 8.1 g/dL   Albumin 3.8 3.5 - 5.0 g/dL   AST 17 15 - 41 U/L   ALT 18 17 - 63 U/L   Alkaline Phosphatase 88 38 - 126 U/L   Total Bilirubin 0.3 0.3 - 1.2 mg/dL   GFR calc non Af Amer >60 >60 mL/min   GFR calc Af Amer >60 >60 mL/min    Comment: (NOTE) The eGFR has been calculated using the CKD EPI equation. This calculation has not been validated in all clinical situations. eGFR's persistently <60 mL/min signify possible  Chronic Kidney Disease.  Anion gap 8 5 - 15    Comment: Performed at [facility]  Glucose, capillary     Status: None   Collection Time: 07/17/15  6:07 AM  Result Value Ref Range   Glucose-Capillary 95 65 - 99 mg/dL  Glucose, capillary     Status: None   Collection Time: 07/17/15 11:59 AM  Result Value Ref Range   Glucose-Capillary 96 65 - 99 mg/dL   Comment 1 Notify RN   Glucose, capillary     Status: Abnormal   Collection Time: 07/17/15  7:00 PM  Result Value Ref Range   Glucose-Capillary 120 (H) 65 - 99 mg/dL  Glucose, capillary     Status: Abnormal   Collection Time: 07/18/15  7:41 AM  Result Value Ref Range   Glucose-Capillary 101 (H) 65 - 99 mg/dL    Physical Findings: AIMS: Facial and Oral Movements Muscles of Facial Expression: None, normal Lips and Perioral Area: None, normal Jaw: None, normal Tongue: None, normal,Extremity Movements Upper (arms, wrists, hands, fingers): None, normal Lower (legs, knees, ankles, toes): None, normal, Trunk Movements Neck, shoulders, hips: None, normal, Overall Severity Severity of abnormal movements (highest score from questions above): None, normal Incapacitation due to abnormal movements: None, normal Patient's awareness of abnormal movements (rate only patient's report): No Awareness, Dental Status Current problems with teeth and/or dentures?: No Does patient usually wear dentures?: No  CIWA:    COWS:     Treatment Plan Summary: Daily contact with patient to assess and evaluate symptoms and progress in treatment and Medication management   Medications: -Reviewed current medication , patient cognition is improved from the past.  He is less sedated and more alert.  However he still have balance issues and requires 1:1 for fall precaution. Amitriptyline level still pending. Continue present dose of Seroquel, Klonopin and amitriptyline.   His abdominal pain is less intense from the past however we will also  closely monitor his bowel movements and if no relief we will consider setting into the emergency room for medical clearance.  Patient has EKG which is unchanged from the past.   Medical Decision Making:  Review or order clinical lab tests (1), Review of Last Therapy Session (1), Review of Medication Regimen & Side Effects (2) and Review of New Medication or Change in Dosage (2)   Dereck Leep, MD 07/18/2015,

## 2015-07-19 LAB — GLUCOSE, CAPILLARY
Glucose-Capillary: 107 mg/dL — ABNORMAL HIGH (ref 65–99)
Glucose-Capillary: 121 mg/dL — ABNORMAL HIGH (ref 65–99)

## 2015-07-19 NOTE — Progress Notes (Signed)
D: Patient is alert and oriented. Pt's mood and affect is depressed and appropriate to circumstance. Pt denies SI/HI and AVH. Pt experiencing hypotension this morning, denies symptoms (See docflowsheet-vitals.) Pt uses front wheel walker and gait belt during ambulation, minimal assist with MHT present. Pt has "blister" on left sole of foot, denies pain. Pt requesting to try a new anti-depressant medication, reports he has been on his current medications for a long time and does not believe they are working for him anymore. Pt rates depression and anxiety both 8/10, pt reports his hopelessness is "very high" today. Pt reports his goal for the day is "walk." Pt is not attending unit groups today and remains in bed isolative the majority of the day. A: Pt remains on 1:1 status per providers orders, MHT with pt at all times, see additional notes at 1000, 1400, and 1800. Pushing PO fluids when hypotensive, will reassess/monitor BP and pulse, 0800 BP medication held, MD Dwyane Dee made aware. Active listening by RN. Encouragement/Support provided to pt. Fall precautions/prevention reviewed with pt. Medication education reviewed with pt. Scheduled medications administered per providers orders (See MAR). 15 minute checks continued per protocol for patient safety.  R: Patient cooperative and receptive to nursing interventions. Pt remains safe.

## 2015-07-19 NOTE — Progress Notes (Signed)
1:1 NOTE: D: Patient is alert and oriented. Pt is observed resting in bed reading at this time. Pt denies having any concerns at this time. Pt does not appear to be in any acute distress at this time. A: Pt remains on 1:1 status per providers orders, MHT with pt at all times. Active listening by RN. Encouragement/Support provided to pt. 15 minute checks continued per protocol for patient safety.  R: Patient cooperative and receptive to nursing interventions. Pt remains safe.

## 2015-07-19 NOTE — Progress Notes (Signed)
1:1 NOTE: D: Patient is alert and oriented. Pt is resting in bed at this time and reading a book. Pt expresses concerns that he is unable to get motivated and get out of bed when he is at home. Pt is sad and hopeless at this time. Pt denies having any additional concerns at this time. A: Pt remains on 1:1 status per providers orders, MHT with pt at all times. Tips for increasing motivation reviewed with pt. Pt encouraged to use positive self talk daily. Active listening by RN. Encouragement/Support provided to pt.  15 minute checks continued per protocol for patient safety.  R: Patient cooperative and receptive to nursing interventions. Pt remains safe.

## 2015-07-19 NOTE — Progress Notes (Signed)
The focus of this group is to help patients review their daily goal of treatment and discuss progress on daily workbooks. Pt attended the evening group session and responded to all discussion prompts from the [place]. Pt shared that today was a bad day on the unit, the reason for which was his frustration with the treatment options presented to him. Pt mentioned that ECT was suggested earlier, an option he was very resistant to consider. Pt was encouraged by the Writer to consider all options suggested to him by Nursing and Physician staff with an open mind and to ask questions if there are any options he is unsure about. [place] reported having no additional needs from Nursing Staff this evening. Pt's affect was appropriate.

## 2015-07-19 NOTE — Progress Notes (Signed)
Patient ID: [name], male   DOB: 09/23/1956, 59 y.o.   MRN: [id number] D: Client visible on the unit, on the phone early in the shift. Client reports "I didn't want to be on the phone that long with her" Client is sullen says he felt "pissed off" with the doctor" "she was telling me about ECT, I don't want that" "she needs to listen to what I'm saying" A: Write provided emotional support, encouraged client to speak with doctor tomorrow for further clarification. Reviewed medications, administered as prescribed. Staff will monitor q31min for safety. R:Client is safe on the unit, attended group.

## 2015-07-19 NOTE — Progress Notes (Signed)
Patient ID: [name], male   DOB: 15-Mar-1956, 59 y.o.   MRN: [id number]  D: Patient has been up a lot since bedtime medication. Patient talking to sitter instead of trying to rest. Patient anxious and talks about medication a lot tonight. PRN Vistaril given earlier. To help with his anxiety and restlessness. Still has some unsteady gait at times when OOB. A: Staff will monitor on 1:1 for safety due to fall risk R: Cooperating with sitter directions.

## 2015-07-19 NOTE — Tx Team (Signed)
Interdisciplinary Treatment Plan Update (Adult)  Date:  07/19/2015   Time Reviewed:  8:24 AM   Progress in Treatment: Attending groups: Yes. Participating in groups:  Yes. Taking medication as prescribed:  Yes. Tolerating medication:  Yes. Family/Significant other contact made:  No Patient understands diagnosis:  Yes  As evidenced by seeking help with depression Discussing patient identified problems/goals with staff:  Yes, see initial care plan. Medical problems stabilized or resolved:  Yes. Denies suicidal/homicidal ideation: Yes. Issues/concerns per patient self-inventory:  No. Other:  New problem(s) identified:  Discharge Plan or Barriers: return home, follow up outpt  Reason for Continuation of Hospitalization: Anxiety Depression Medication stabilization  Comments: I feel I need to try one of the newer antidepressants, I'm okay with getting the information about ECT and [place] but I do not want to have ECT  Patient states that he continues to feel depressed but feels less agitated. He adds that he's okay about obtaining information for ECT and TMS but is not interested in ECT as he has a friend who had ECT done a year ago and she is very forgetful. Patient reports side effects on SSRIs but wants to add an antidepressant from another class. Patient continues to ambulate with a walker, is on one-to-one for falls precautions as he is hypotensive  Patient still struggles with coping skills, seems fixated on being sick and struggles with finding ways to get better. Patient continues to be negative and is unable to identify any positives and has life. Patient contracts for safety on the unit.      Estimated length of stay: 2-3 days  New goal(s):  Review of initial/current patient goals per problem list:   Review of initial/current patient goals per problem list:  1. Goal(s): Patient will participate in aftercare plan   Met: Yes   Target date: 3-5 days post admission  date   As evidenced by: Patient will participate within aftercare plan AEB aftercare provider and housing plan at discharge being identified.  07/19/2015: Pt plans to return home, follow up outpt.     2. Goal (s): Patient will exhibit decreased depressive symptoms and suicidal ideations.   Met: No   Target date: 3-5 days post admission date   As evidenced by: Patient will utilize self rating of depression at 3 or below and demonstrate decreased signs of depression or be deemed stable for discharge by MD.  07/14/15: Alvester Chou rates his depression at a 9 today. 07/19/2015 Alvester Chou denies SI today.  He rates his depression at a 7.  Goal progressing.  3. Goal(s): Patient will demonstrate decreased signs and symptoms of anxiety.   Met: No   Target date: 3-5 days post admission date   As evidenced by: Patient will utilize self rating of anxiety at 3 or below and demonstrated decreased signs of anxiety, or be deemed stable for discharge by MD  07/14/2015: Alvester Chou rates his anxiety at a 10 today, and is seen pacing in the hall. 07/19/2015   Alvester Chou rates his anxiety at an 8 today.  Goal progressing.     Attendees: Patient:  07/19/2015 8:24 AM   Family:   07/19/2015 8:24 AM   Physician:  Hampton Abbot, MD 07/19/2015 8:24 AM   Nursing:   Gaylan Gerold, RN 07/19/2015 8:24 AM   CSW:    Roque Lias, LCSW   07/19/2015 8:24 AM   Other:  07/19/2015 8:24 AM   Other:   07/19/2015 8:24 AM   Other:  Lars Pinks, Nurse CM 07/19/2015  8:24 AM   Other:  Lucinda Dell, [facility] 07/19/2015 8:24 AM   Other:  Norberto Sorenson, [place]  07/19/2015 8:24 AM   Other:  07/19/2015 8:24 AM   Other:  07/19/2015 8:24 AM   Other:  07/19/2015 8:24 AM   Other:  07/19/2015 8:24 AM   Other:  07/19/2015 8:24 AM   Other:   07/19/2015 8:24 AM    Scribe for Treatment Team:   Trish Mage, 07/19/2015 8:24 AM

## 2015-07-19 NOTE — [facility] Group Notes (Signed)
[facility] LCSW Group Therapy  07/19/2015 , 2:21 PM   Type of Therapy:  Group Therapy  Participation Level:  Came in late.  Stayed briefly and left.  Did not return.  Summary of Progress/Problems: Today's group focused on the term Diagnosis.  Participants were asked to define the term, and then pronounce whether it is a negative, positive or neutral term.  [name] 07/19/2015 , 2:21 PM

## 2015-07-19 NOTE — Progress Notes (Signed)
Patient ID: [name], male   DOB: [birth year], 59 y.o.   MRN: [id number]  D: Patient remains in bed. Appears asleep at present. Respirations even and non-labored. A: Staff will monitor on 1:1 for safety R: Continues to need monitoring

## 2015-07-19 NOTE — Progress Notes (Signed)
Patient ID: [name], male   DOB: 01-17-1956, 59 y.o.   MRN: [id number] Adult Psychoeducational Group Note  Date:  07/19/2015 Time: 09:15am  Group Topic/Focus:  Recovery Goals:   The focus of this group is to identify appropriate goals for recovery and establish a plan to achieve them.  Participation Level:  Did Not Attend  Participation Quality: n/a  Affect: n/a  Cognitive:  n/a  Insight: n/a  Engagement in Group: n/a  Modes of Intervention:  Activity, Education, Orientation, Socialization and Support  Additional Comments:  Pt did not attend group. Pt in bed asleep.   Elenore Rota 07/19/2015, 10:12 AM

## 2015-07-19 NOTE — Plan of Care (Signed)
Problem: Ineffective individual coping Goal: STG: Patient will remain free from self harm Outcome: Progressing Patient remains free from self harm. 15 minute checks continued per protocol for patient safety. 1:1 status maintained per providers orders, MHT with pt at all times.  Problem: Alteration in mood Goal: LTG-Patient reports reduction in suicidal thoughts (Patient reports reduction in suicidal thoughts and is able to verbalize a safety plan for whenever patient is feeling suicidal)  Outcome: Progressing Patient denies having suicidal thoughts today.  Problem: Diagnosis: Increased Risk For Suicide Attempt Goal: STG-Patient Will Attend All Groups On The Unit Outcome: Not Progressing Patient is not attending unit groups today. Goal: STG-Patient Will Comply With Medication Regime Outcome: Progressing Patient has adhered to medication regimen today with ease.

## 2015-07-19 NOTE — Progress Notes (Signed)
Patient ID: [name], male   DOB: 1956/11/02, 59 y.o.   MRN: [id number]       [facility] MD Progress Note  07/19/2015 12:14 PM DORA CLAUSS  MRN:  [id number]   Subjective: I feel I need to try one of the newer antidepressants, I'm okay with getting the information about ECT and [place] but I do not want to have ECT  Objective Patient states that he continues to feel depressed but feels less agitated. He adds that he's okay about obtaining information for ECT and TMS but is not interested in ECT as he has a friend who had ECT done a year ago and she is very forgetful. Patient reports side effects on SSRIs but wants to add an antidepressant from another class. Patient continues to ambulate with a walker, is on one-to-one for falls precautions as he is hypotensive  Patient still struggles with coping skills, seems fixated on being sick and struggles with finding ways to get better. Patient continues to be negative and is unable to identify any positives and has life. Patient contracts for safety on the unit.  Principal Problem: Schizoaffective disorder, depressive type with good prognostic features Diagnosis:   Patient Active Problem List   Diagnosis Date Noted  . Schizoaffective disorder, depressive type with good prognostic features [F25.1] 07/13/2015  . Abdominal pain [R10.9]   . Constipation [K59.00]   . Verruca [B07.9] 05/18/2015  . Seborrheic dermatitis of scalp [L21.8] 05/18/2015  . Blood poisoning [A41.9]   . MDD (major depressive disorder), recurrent severe, without psychosis [F33.2]   . OCD (obsessive compulsive disorder) [F42]   . Confusion [R41.0] 12/31/2014  . Seizures [R56.9]   . Schizoaffective disorder, depressive type [F25.1]   . Orthostatic hypotension [I95.1]   . Hyperlipidemia [E78.5]   . Falls [phone]]   . HLD (hyperlipidemia) [E78.5]   . Seizure [R56.9] 11/18/2014  . Schizoaffective disorder, unspecified type [F25.9]   . Essential hypertension [I10]   . Overdose of  beta-adrenergic antagonist drug [T44.7X1A] 10/22/2014  . Altered mental status [R41.82]   . Overdose [T50.901A]   . Nocturnal enuresis [N39.44] 03/23/2014  . Benzodiazepine dependence, continuous [F13.20] 01/27/2014  . Opiate dependence [F11.20] 01/22/2014  . Severe major depression [F32.2] 01/21/2014  . Frozen shoulder syndrome [M75.00] 09/20/2013  . Somnolence [R40.0] 09/20/2013  . Schizoaffective disorder [F25.9] 09/20/2013  . Malaise and fatigue [R53.81, R53.83] 04/21/2013  . Presbyopia [H52.4] 04/25/2012  . Androgen deficiency [E29.1] 09/07/2011  . Constipation, chronic [K59.00] 09/07/2011  . Other specified disorders of liver [K76.89] 12/30/2008  . Overweight(278.02) [E66.3] 09/08/2008  . HYPERLIPIDEMIA [E78.5] 01/28/2008  . HYPERTENSION [I10] 01/28/2008  . Seizure disorder [G40.909] 01/28/2008  . OSTEOPENIA [M89.9, M94.9] 07/01/2007   Total Time spent with patient: 25 minutes   Past Medical History:  Past Medical History  Diagnosis Date  . Seizures   . Hypertension   . GERD (gastroesophageal reflux disease)   . Dyslipidemia   . Schizoaffective disorder   . Depression   . Anxiety   . MRSA carrier   . Constipation   . Memory loss   . Hearing loss     Past Surgical History  Procedure Laterality Date  . Self orchectomy    . Colonoscopy  2010  . Orif shoulder fracture    . Shoulder closed reduction Left 09/16/2013    Procedure: CLOSED REDUCTION SHOULDER;  Surgeon: Mauri Pole, MD;  Location: [facility];  Service: Orthopedics;  Laterality: Left;  . Shoulder hemi-arthroplasty Left 09/18/2013  Procedure: LEFT SHOULDER HEMI-ARTHROPLASTY;  Surgeon: Augustin Schooling, MD;  Location: [facility];  Service: Orthopedics;  Laterality: Left;  . Carpal tunnel release     Family History:  Family History  Problem Relation Age of Onset  . Stroke Father     Living at 29  . Stroke Mother     Stroke in late 109's. Died in her 6's   Social History:  History  Alcohol Use No      History  Drug Use No    History   Social History  . Marital Status: Single    Spouse Name: N/A  . Number of Children: 0  . Years of Education: 18   Occupational History  . disabled    Social History Main Topics  . Smoking status: Never Smoker   . Smokeless tobacco: Never Used  . Alcohol Use: No  . Drug Use: No  . Sexual Activity: No   Other Topics Concern  . None   Social History Narrative   Patient does not drink caffeine.   Patient is left handed.   Additional History:    Sleep: Fair  Appetite:  Fair   Assessment: See above  Musculoskeletal: Strength & Muscle Tone: within normal limits Gait & Station: Antalgic with intentional change in gait due to blister  Patient leans: N/A   Psychiatric Specialty Exam: Physical Exam  Review of Systems  Constitutional: Negative for fever, weight loss and malaise/fatigue.  HENT: Negative.  Negative for congestion and sore throat.   Eyes: Negative.  Negative for blurred vision, discharge and redness.  Respiratory: Negative.  Negative for cough and wheezing.   Cardiovascular: Negative.  Negative for chest pain and palpitations.  Gastrointestinal: Positive for constipation. Negative for heartburn, nausea, vomiting, abdominal pain and diarrhea.  Genitourinary: Negative.  Negative for dysuria.  Musculoskeletal: Negative.  Negative for myalgias and falls.       Is a fall risk and needs one-to-one as patient's blood pressure is low  Skin: Negative for rash.  Neurological: Positive for weakness. Negative for dizziness, tingling, seizures, loss of consciousness and headaches.  Endo/Heme/Allergies: Negative.  Negative for environmental allergies.  Psychiatric/Behavioral: Positive for depression. Negative for suicidal ideas and substance abuse. The patient is nervous/anxious and has insomnia.   All other systems reviewed and are negative.   Blood pressure 136/90, pulse 57, temperature 98 F (36.7 C), temperature source Oral,  resp. rate 18, height 5\' 11"  (1.803 m), weight 89.812 kg (198 lb).Body mass index is 27.63 kg/(m^2).  General Appearance: Disheveled and Fairly Groomed  Engineer, water::  Fair  Speech:  Normal Rate  Volume:  Normal  Mood:  Anxious, depressed  Affect:  Appropriate and Congruent  Thought Process:  Coherent, Intact, Linear and Logical  Orientation:  Full (Time, Place, and Person)  Thought Content:  Obsessions and Rumination  Suicidal Thoughts:  No  Homicidal Thoughts:  No  Memory:  Immediate;   Fair Recent;   Fair Remote;   Fair  Judgement:  Impaired  Insight:  Lacking  Psychomotor Activity:  Normal and Mannerisms  Concentration:  Good  Recall:  Poor  Fund of Knowledge:Fair  Language: Fair  Akathisia:  No   Handed:    AIMS (if indicated):     Assets:  Communication Skills Desire for Improvement Resilience Social Support  ADL's:  Intact  Cognition: WNL  Sleep:  Number of Hours: 5.75     Current Medications: Current Facility-Administered Medications  Medication Dose Route Frequency Provider Last Rate Last  Dose  . acetaminophen (TYLENOL) tablet 650 mg  650 mg Oral Q6H PRN Laverle Hobby, PA-C   650 mg at 07/15/15 0758  . alum & mag hydroxide-simeth (MAALOX/MYLANTA) 200-200-20 MG/5ML suspension 30 mL  30 mL Oral Q4H PRN Laverle Hobby, PA-C      . amitriptyline (ELAVIL) tablet 300 mg  300 mg Oral QHS Kathlee Nations, MD   300 mg at 07/18/15 2125  . aspirin EC tablet 81 mg  81 mg Oral Daily Laverle Hobby, PA-C   81 mg at 07/19/15 3149  . clonazePAM (KLONOPIN) tablet 2 mg  2 mg Oral TID Kathlee Nations, MD   2 mg at 07/19/15 1134  . hydrOXYzine (ATARAX/VISTARIL) tablet 50 mg  50 mg Oral Q6H PRN Hampton Abbot, MD   50 mg at 07/19/15 0121  . lacosamide (VIMPAT) tablet 150 mg  150 mg Oral BID Laverle Hobby, PA-C   150 mg at 07/19/15 0836  . magnesium hydroxide (MILK OF MAGNESIA) suspension 30 mL  30 mL Oral Daily PRN Laverle Hobby, PA-C   30 mL at 07/14/15 1030  . metoprolol  succinate (TOPROL-XL) 24 hr tablet 50 mg  50 mg Oral Daily Laverle Hobby, PA-C   Stopped at 07/19/15 [phone]  . phenytoin (DILANTIN) ER capsule 300 mg  300 mg Oral QHS Laverle Hobby, PA-C   300 mg at 07/18/15 2125  . polyethylene glycol (MIRALAX / GLYCOLAX) packet 17 g  17 g Oral BID Kerrie Buffalo, NP   17 g at 07/19/15 0840  . QUEtiapine (SEROQUEL) tablet 600 mg  600 mg Oral QHS Kathlee Nations, MD   600 mg at 07/18/15 2125  . senna-docusate (Senokot-S) tablet 1 tablet  1 tablet Oral QHS Laverle Hobby, PA-C   1 tablet at 07/18/15 2125  . simvastatin (ZOCOR) tablet 40 mg  40 mg Oral QHS Laverle Hobby, PA-C   40 mg at 07/18/15 2125  . testosterone cypionate (DEPOTESTOSTERONE CYPIONATE) injection 400 mg  400 mg Intramuscular Q14 Days Laverle Hobby, PA-C   400 mg at 07/13/15 1226    Lab Results:  Results for orders placed or performed during the hospital encounter of 07/13/15 (from the past 48 hour(s))  Glucose, capillary     Status: Abnormal   Collection Time: 07/17/15  7:00 PM  Result Value Ref Range   Glucose-Capillary 120 (H) 65 - 99 mg/dL  Glucose, capillary     Status: Abnormal   Collection Time: 07/18/15  7:41 AM  Result Value Ref Range   Glucose-Capillary 101 (H) 65 - 99 mg/dL  Glucose, capillary     Status: None   Collection Time: 07/18/15  4:58 PM  Result Value Ref Range   Glucose-Capillary 96 65 - 99 mg/dL   Comment 1 Notify RN    Comment 2 Document in Chart   Glucose, capillary     Status: Abnormal   Collection Time: 07/19/15  6:06 AM  Result Value Ref Range   Glucose-Capillary 107 (H) 65 - 99 mg/dL   Comment 1 Notify RN     Physical Findings: AIMS: Facial and Oral Movements Muscles of Facial Expression: None, normal Lips and Perioral Area: None, normal Jaw: None, normal Tongue: None, normal,Extremity Movements Upper (arms, wrists, hands, fingers): None, normal Lower (legs, knees, ankles, toes): None, normal, Trunk Movements Neck, shoulders, hips: None, normal,  Overall Severity Severity of abnormal movements (highest score from questions above): None, normal Incapacitation due to abnormal movements:  None, normal Patient's awareness of abnormal movements (rate only patient's report): No Awareness, Dental Status Current problems with teeth and/or dentures?: No Does patient usually wear dentures?: No  CIWA:    COWS:     Treatment Plan Summary: Daily contact with patient to assess and evaluate symptoms and progress in treatment and Medication management   Medications:  Continue present dose of Seroquel, Klonopin and amitriptyline.   Continue one-on-one for falls precaution To continue to encourage patient to have fluids. To give patient information about [place] and ECT today so he can make an informed decision as patient has had a poor response to antidepressants and has been tried many antidepressants. When amitriptyline was increased patient was noted to be confused and rambling and so currently is on 300 mg daily  Medical Decision Making:  Review or order clinical lab tests (1), Review of Last Therapy Session (1), Review of Medication Regimen & Side Effects (2) and Review of New Medication or Change in Dosage (2)   Hampton Abbot, MD 07/19/2015,

## 2015-07-19 NOTE — Progress Notes (Signed)
1:1 NOTE: D: Patient is observed resting in bed, supine with eyes closed at this time. Pt appears to be asleep. Pt's respirations are even and unlabored. Pt appears to be in no acute distress at this time. A: 1:1 status maintained per providers orders, MHT with pt at all times. 15 minute checks continued per protocol for patient safety.  R: Pt remains safe.

## 2015-07-20 LAB — AMITRIPTYLINE LEVEL
Amitrip+Nortrip: 250 mcg/L (ref 100–250)
Amitriptyline: 148 mcg/L
Nortriptyline-AMITR: 102 mcg/L

## 2015-07-20 LAB — GLUCOSE, CAPILLARY: Glucose-Capillary: 144 mg/dL — ABNORMAL HIGH (ref 65–99)

## 2015-07-20 NOTE — Progress Notes (Signed)
Patient ID: [name], male   DOB: June 10, 1956, 58 y.o.   MRN: [id number]  1:1 Nursing Note-  Patient is sitting in the group room at this moment listening to the presenter playing his guitar. MHT is within reach for patient needs. Patient appears to be focused and engaged in group. No distress noted, respirations are even and unlabored. Q15 minute safety checks maintained and 1:1 is maintained for safety as well.

## 2015-07-20 NOTE — [facility] Suicide Risk Assessment (Signed)
[place] INPATIENT:  Family/Significant Other Suicide Prevention Education  Suicide Prevention Education:  Patient Refusal for Family/Significant Other Suicide Prevention Education: The patient [name] has refused to provide written consent for family/significant other to be provided Family/Significant Other Suicide Prevention Education during admission and/or prior to discharge.  Physician notified. I don't have anyone for you to call." Trish Mage 07/20/2015, 3:11 PM

## 2015-07-20 NOTE — Tx Team (Signed)
Interdisciplinary Treatment Plan Update (Adult)  Date:  07/20/2015   Time Reviewed:  3:12 PM   Progress in Treatment: Attending groups: Yes. Participating in groups:  Yes. Taking medication as prescribed:  Yes. Tolerating medication:  Yes. Family/Significant other contact made:  No Patient understands diagnosis:  Yes  As evidenced by seeking help with depression Discussing patient identified problems/goals with staff:  Yes, see initial care plan. Medical problems stabilized or resolved:  Yes. Denies suicidal/homicidal ideation: Yes. Issues/concerns per patient self-inventory:  No. Other:  New problem(s) identified:  Discharge Plan or Barriers: return home, follow up outpt  Reason for Continuation of Hospitalization:   Comments:       Estimated length of stay: Likely d/c tomorrow  New goal(s):  Review of initial/current patient goals per problem list:   Review of initial/current patient goals per problem list:  1. Goal(s): Patient will participate in aftercare plan   Met: Yes   Target date: 3-5 days post admission date   As evidenced by: Patient will participate within aftercare plan AEB aftercare provider and housing plan at discharge being identified.  07/20/2015: Pt plans to return home, follow up outpt.     2. Goal (s): Patient will exhibit decreased depressive symptoms and suicidal ideations.   Met: No   Target date: 3-5 days post admission date   As evidenced by: Patient will utilize self rating of depression at 3 or below and demonstrate decreased signs of depression or be deemed stable for discharge by MD.  07/14/15: [name] rates his depression at a 9 today. 07/19/15 [name] denies SI today.  He rates his depression at a 7.  Goal progressing. 07/20/2015 [name] rates his depression at a 3 today.   3. Goal(s): Patient will demonstrate decreased signs and symptoms of anxiety.   Met: Yes   Target date: 3-5 days post admission date   As evidenced by:  Patient will utilize self rating of anxiety at 3 or below and demonstrated decreased signs of anxiety, or be deemed stable for discharge by MD  07/14/2015: [name] rates his anxiety at a 10 today, and is seen pacing in the hall. 07/19/15   [name] rates his anxiety at an 8 today.  Goal progressing. 07/20/2015 [name] rates his anxiety at a 3 today.       Attendees: Patient:  07/20/2015 3:12 PM   Family:   07/20/2015 3:12 PM   Physician:  Hampton Abbot, MD 07/20/2015 3:12 PM   Nursing:   Gaylan Gerold, RN 07/20/2015 3:12 PM   CSW:    Roque Lias, LCSW   07/20/2015 3:12 PM   Other:  07/20/2015 3:12 PM   Other:   07/20/2015 3:12 PM   Other:  Lars Pinks, Nurse CM 07/20/2015 3:12 PM   Other:  Lucinda Dell, [facility] 07/20/2015 3:12 PM   Other:  Norberto Sorenson, [facility]  07/20/2015 3:12 PM   Other:  07/20/2015 3:12 PM   Other:  07/20/2015 3:12 PM   Other:  07/20/2015 3:12 PM   Other:  07/20/2015 3:12 PM   Other:  07/20/2015 3:12 PM   Other:   07/20/2015 3:12 PM    Scribe for Treatment Team:   Trish Mage, 07/20/2015 3:12 PM

## 2015-07-20 NOTE — Progress Notes (Signed)
Patient ID: [name], male   DOB: 1956-01-09, 59 y.o.   MRN: [id number]    [facility] MD Progress Note  07/20/2015 11:25 AM [name]  MRN:  [id number]   Subjective: I don't want to follow-up at the transitional care team, I'm not interested in ECT or [facility]. Nothing seems to help her depression and I'm going to tell [facility] that I don't want to follow-up with them.  Objective: Patient is a 59 year old male diagnosed with schizoaffective disorder who continues to struggle with depression, is resistant to any changes in his treatment. Patient would benefit with working with a transitional care team to help him stabilize outpatient on discharge.   Patient refused to attend discharge planning, still fixated on his depression, suicidal ideation and is unwilling to work on his coping skills or on gaining insight into his depression and suicidality. Patient does contract for safety on the unit, moves around with a walker and denies any dizziness, problems with gait   Principal Problem: Schizoaffective disorder, depressive type with good prognostic features Diagnosis:   Patient Active Problem List   Diagnosis Date Noted  . Schizoaffective disorder, depressive type with good prognostic features [F25.1] 07/13/2015  . Abdominal pain [R10.9]   . Constipation [K59.00]   . Verruca [B07.9] 05/18/2015  . Seborrheic dermatitis of scalp [L21.8] 05/18/2015  . Blood poisoning [A41.9]   . MDD (major depressive disorder), recurrent severe, without psychosis [F33.2]   . OCD (obsessive compulsive disorder) [F42]   . Confusion [R41.0] 12/31/2014  . Seizures [R56.9]   . Schizoaffective disorder, depressive type [F25.1]   . Orthostatic hypotension [I95.1]   . Hyperlipidemia [E78.5]   . Falls [phone].[id number]]   . HLD (hyperlipidemia) [E78.5]   . Seizure [R56.9] 11/18/2014  . Schizoaffective disorder, unspecified type [F25.9]   . Essential hypertension [I10]   . Overdose of beta-adrenergic antagonist drug [T44.7X1A]  10/22/2014  . Altered mental status [R41.82]   . Overdose [T50.901A]   . Nocturnal enuresis [N39.44] 03/23/2014  . Benzodiazepine dependence, continuous [F13.20] 01/27/2014  . Opiate dependence [F11.20] 01/22/2014  . Severe major depression [F32.2] 01/21/2014  . Frozen shoulder syndrome [M75.00] 09/20/2013  . Somnolence [R40.0] 09/20/2013  . Schizoaffective disorder [F25.9] 09/20/2013  . Malaise and fatigue [R53.81, R53.83] 04/21/2013  . Presbyopia [H52.4] 04/25/2012  . Androgen deficiency [E29.1] 09/07/2011  . Constipation, chronic [K59.00] 09/07/2011  . Other specified disorders of liver [K76.89] 12/30/2008  . Overweight(278.02) [E66.3] 09/08/2008  . HYPERLIPIDEMIA [E78.5] 01/28/2008  . HYPERTENSION [I10] 01/28/2008  . Seizure disorder [G40.909] 01/28/2008  . OSTEOPENIA [M89.9, M94.9] 07/01/2007   Total Time spent with patient: 20 minutes   Past Medical History:  Past Medical History  Diagnosis Date  . Seizures   . Hypertension   . GERD (gastroesophageal reflux disease)   . Dyslipidemia   . Schizoaffective disorder   . Depression   . Anxiety   . MRSA carrier   . Constipation   . Memory loss   . Hearing loss     Past Surgical History  Procedure Laterality Date  . Self orchectomy    . Colonoscopy  2010  . Orif shoulder fracture    . Shoulder closed reduction Left 09/16/2013    Procedure: CLOSED REDUCTION SHOULDER;  Surgeon: Mauri Pole, MD;  Location: [facility];  Service: Orthopedics;  Laterality: Left;  . Shoulder hemi-arthroplasty Left 09/18/2013    Procedure: LEFT SHOULDER HEMI-ARTHROPLASTY;  Surgeon: Augustin Schooling, MD;  Location: [place];  Service: Orthopedics;  Laterality: Left;  .  Carpal tunnel release     Family History:  Family History  Problem Relation Age of Onset  . Stroke Father     Living at 29  . Stroke Mother     Stroke in late 34's. Died in her 62's   Social History:  History  Alcohol Use No     History  Drug Use No    History   Social  History  . Marital Status: Single    Spouse Name: N/A  . Number of Children: 0  . Years of Education: 18   Occupational History  . disabled    Social History Main Topics  . Smoking status: Never Smoker   . Smokeless tobacco: Never Used  . Alcohol Use: No  . Drug Use: No  . Sexual Activity: No   Other Topics Concern  . None   Social History Narrative   Patient does not drink caffeine.   Patient is left handed.   Additional History:    Sleep: Fair  Appetite:  Fair   Assessment: See above  Musculoskeletal: Strength & Muscle Tone: within normal limits Gait & Station: Patient using walker to ambulate, seems to be walking fine  Patient leans: N/A   Psychiatric Specialty Exam: Physical Exam  Review of Systems  Constitutional: Negative for fever, weight loss and malaise/fatigue.  HENT: Negative.  Negative for congestion and sore throat.   Eyes: Negative.  Negative for blurred vision, discharge and redness.  Respiratory: Negative.  Negative for cough and wheezing.   Cardiovascular: Negative.  Negative for chest pain and palpitations.  Gastrointestinal: Positive for constipation. Negative for heartburn, nausea, vomiting, abdominal pain and diarrhea.  Genitourinary: Negative.  Negative for dysuria.  Musculoskeletal: Negative.  Negative for myalgias and falls.       Is a fall risk and needs one-to-one as patient's blood pressure is low  Skin: Negative for rash.  Neurological: Positive for weakness. Negative for dizziness, tingling, seizures, loss of consciousness and headaches.  Endo/Heme/Allergies: Negative.  Negative for environmental allergies.  Psychiatric/Behavioral: Positive for depression. Negative for suicidal ideas and substance abuse. The patient is nervous/anxious. The patient does not have insomnia.   All other systems reviewed and are negative.   Blood pressure 123/70, pulse 83, temperature 97.7 F (36.5 C), temperature source Oral, resp. rate 20, height 5'  11" (1.803 m), weight 89.812 kg (198 lb).Body mass index is 27.63 kg/(m^2).  General Appearance: Disheveled and Fairly Groomed  Engineer, water::  Fair  Speech:  Normal Rate  Volume:  Normal  Mood:  Anxious, depressed  Affect:  Appropriate and Congruent  Thought Process:  Coherent, Intact, Linear and Logical  Orientation:  Full (Time, Place, and Person)  Thought Content:  Obsessions and Rumination  Suicidal Thoughts:  Yes.  without intent/plan contracts for safety on the unit ,  Homicidal Thoughts:  No  Memory:  Immediate;   Fair Recent;   Fair Remote;   Fair  Judgement:  Impaired  Insight:  Lacking  Psychomotor Activity:  Normal and Mannerisms  Concentration:  Good  Recall:  Poor  Fund of Knowledge:Fair  Language: Fair  Akathisia:  No   Handed:    AIMS (if indicated):     Assets:  Communication Skills Desire for Improvement Resilience Social Support  ADL's:  Intact  Cognition: WNL  Sleep:  Number of Hours: 6.75     Current Medications: Current Facility-Administered Medications  Medication Dose Route Frequency Provider Last Rate Last Dose  . acetaminophen (TYLENOL) tablet 650 mg  650 mg Oral Q6H PRN Laverle Hobby, PA-C   650 mg at 07/15/15 0758  . alum & mag hydroxide-simeth (MAALOX/MYLANTA) 200-200-20 MG/5ML suspension 30 mL  30 mL Oral Q4H PRN Laverle Hobby, PA-C      . amitriptyline (ELAVIL) tablet 300 mg  300 mg Oral QHS Kathlee Nations, MD   300 mg at 07/19/15 2147  . aspirin EC tablet 81 mg  81 mg Oral Daily Laverle Hobby, PA-C   81 mg at 07/20/15 8676  . clonazePAM (KLONOPIN) tablet 2 mg  2 mg Oral TID Kathlee Nations, MD   2 mg at 07/20/15 0906  . hydrOXYzine (ATARAX/VISTARIL) tablet 50 mg  50 mg Oral Q6H PRN Hampton Abbot, MD   50 mg at 07/19/15 0121  . lacosamide (VIMPAT) tablet 150 mg  150 mg Oral BID Laverle Hobby, PA-C   150 mg at 07/20/15 1950  . magnesium hydroxide (MILK OF MAGNESIA) suspension 30 mL  30 mL Oral Daily PRN Laverle Hobby, PA-C   30 mL at  07/14/15 1030  . metoprolol succinate (TOPROL-XL) 24 hr tablet 50 mg  50 mg Oral Daily Laverle Hobby, PA-C   50 mg at 07/20/15 9326  . phenytoin (DILANTIN) ER capsule 300 mg  300 mg Oral QHS Laverle Hobby, PA-C   300 mg at 07/19/15 2147  . polyethylene glycol (MIRALAX / GLYCOLAX) packet 17 g  17 g Oral BID Kerrie Buffalo, NP   17 g at 07/20/15 0907  . QUEtiapine (SEROQUEL) tablet 600 mg  600 mg Oral QHS Kathlee Nations, MD   600 mg at 07/19/15 2147  . senna-docusate (Senokot-S) tablet 1 tablet  1 tablet Oral QHS Laverle Hobby, PA-C   1 tablet at 07/19/15 2147  . simvastatin (ZOCOR) tablet 40 mg  40 mg Oral QHS Laverle Hobby, PA-C   40 mg at 07/19/15 2148  . testosterone cypionate (DEPOTESTOSTERONE CYPIONATE) injection 400 mg  400 mg Intramuscular Q14 Days Laverle Hobby, PA-C   400 mg at 07/13/15 1226    Lab Results:  Results for orders placed or performed during the hospital encounter of 07/13/15 (from the past 48 hour(s))  Glucose, capillary     Status: None   Collection Time: 07/18/15  4:58 PM  Result Value Ref Range   Glucose-Capillary 96 65 - 99 mg/dL   Comment 1 Notify RN    Comment 2 Document in Chart   Glucose, capillary     Status: Abnormal   Collection Time: 07/19/15  6:06 AM  Result Value Ref Range   Glucose-Capillary 107 (H) 65 - 99 mg/dL   Comment 1 Notify RN   Glucose, capillary     Status: Abnormal   Collection Time: 07/19/15  4:55 PM  Result Value Ref Range   Glucose-Capillary 121 (H) 65 - 99 mg/dL    Physical Findings: AIMS: Facial and Oral Movements Muscles of Facial Expression: None, normal Lips and Perioral Area: None, normal Jaw: None, normal Tongue: None, normal,Extremity Movements Upper (arms, wrists, hands, fingers): None, normal Lower (legs, knees, ankles, toes): None, normal, Trunk Movements Neck, shoulders, hips: None, normal, Overall Severity Severity of abnormal movements (highest score from questions above): None, normal Incapacitation due  to abnormal movements: None, normal Patient's awareness of abnormal movements (rate only patient's report): No Awareness, Dental Status Current problems with teeth and/or dentures?: No Does patient usually wear dentures?: No  CIWA:    COWS:     Treatment  Plan Summary: Daily contact with patient to assess and evaluate symptoms and progress in treatment and Medication management   Medications:  Continue present dose of Seroquel, Klonopin and amitriptyline.   Continue one-on-one for falls precaution To continue to encourage patient to have fluids. Patient to follow-up at transitional care team on discharge as he continues to lack insight into his illness, is very resistant to any changes in his treatment and needs outpatient care team to help stabilize him and engage him in outpatient treatment   Medical Decision Making:  Established Problem, Stable/Improving (1), Review of Psycho-Social Stressors (1), Review of Last Therapy Session (1) and Review of Medication Regimen & Side Effects (2)   Hampton Abbot, MD 07/20/2015,

## 2015-07-20 NOTE — Progress Notes (Signed)
Patient ID: CONO GEBHARD, male   DOB: May 28, 1956, 59 y.o.   MRN: [id number]  1:1 Nutrsing note-  Patient is laying in bed at this time with MHT within reach. Patient appears in no distress. Patient reports, "I'm all right" when asked how he was feeling. Q15 minute safety checks are maintained and 1:1 is continued as well.   DAR: Pt. Denies SI/HI and A/V Hallucinations. Patient does not report any pain or discomfort at this time. Support and encouragement provided to the patient. Patient is receptive and cooperative however continues to report that his depression is high and rates depression 8/10. Patient rates his anxiety 7/10 and hopelessness at 9/10. He reports that he slept fair last night, appetite is poor, energy level is normal, and concentration level is poor. He reports his goal for the day is to go to groups. He is attending some groups.

## 2015-07-20 NOTE — [facility] Group Notes (Signed)
[facility] Mental Health Association Group Therapy  07/20/2015 , 1:50 PM    Type of Therapy:  Mental Health Association Presentation  Participation Level:  Active  Participation Quality:  Attentive  Affect:  Blunted  Cognitive:  Oriented  Insight:  Limited  Engagement in Therapy:  Engaged  Modes of Intervention:  Discussion, Education and Socialization  Summary of Progress/Problems:  [name] from [employer] came to present his recovery story and play the guitar.  Came for group.  Stayed the entire time.  Engaged the speaker about mental illness and coping skills.  Stated that he thought the groups might be helpful for him at the [facility].  [name] 07/20/2015 , 1:50 PM

## 2015-07-20 NOTE — Progress Notes (Signed)
Patient ID: DRAGON THRUSH, male   DOB: 06-16-1956, 59 y.o.   MRN: [id number]  1:1 Nursing Note-  Patient laying in bed at this time reading a book. Patient appears in no acute distress and respirations are even and unlabored. Writer let's patient know that medications will be administered soon to which patient states, "okay." MHT is within reach and 1:1 is continued for safety. Q15 minute safety checks are maintained as well.

## 2015-07-20 NOTE — Plan of Care (Signed)
Problem: Alteration in mood Goal: STG-Patient reports thoughts of self-harm to staff Outcome: Progressing Patient denies SI to this Probation officer.

## 2015-07-20 NOTE — Progress Notes (Signed)
D: Pt has depressed affect and mood.  He reports his day "hasn't been real good."  Pt reports he is discharging tomorrow and that he feels safe to do so.  Pt reports "I'll go home, it's time for me to go."  He reports he will follow up with "ACT team."  Pt denies SI/HI, denies hallucinations, denies pain.  He attended evening group.  He has ambulated with his walker this evening.  Pt had a visitor and he described the visit as "good."   A: Met with pt with 1:1 staff present.  Actively listened to pt, provided support and encouragement. 1:1 staff remains with pt for safety.   R: Pt remains on 1:1 for safety due to high fall risk.  He verbally contracts for safety.  Will continue to monitor and assess.

## 2015-07-20 NOTE — [facility] Group Notes (Signed)
[facility] LCSW Aftercare Discharge Planning Group Note   07/20/2015 10:42 AM   Pt was invited to attend group but declined.    [name]

## 2015-07-21 LAB — GLUCOSE, CAPILLARY
Glucose-Capillary: 110 mg/dL — ABNORMAL HIGH (ref 65–99)
Glucose-Capillary: 98 mg/dL (ref 65–99)
Glucose-Capillary: 123 mg/dL — ABNORMAL HIGH (ref 65–99)

## 2015-07-21 LAB — TESTOSTERONE, % FREE: Testosterone-% Free: 0.9 % — ABNORMAL LOW (ref 0.2–0.7)

## 2015-07-21 MED ORDER — ASPIRIN EC 81 MG PO TBEC: 81.0000 mg | DELAYED_RELEASE_TABLET | Freq: Every day | ORAL | Status: DC

## 2015-07-21 MED ORDER — QUETIAPINE FUMARATE 300 MG PO TABS: 600.0000 mg | ORAL_TABLET | Freq: Every day | ORAL | Status: DC

## 2015-07-21 MED ORDER — SIMVASTATIN 40 MG PO TABS: 40.0000 mg | ORAL_TABLET | Freq: Every day | ORAL | Status: DC

## 2015-07-21 MED ORDER — CLONAZEPAM 2 MG PO TABS: 2.0000 mg | ORAL_TABLET | Freq: Three times a day (TID) | ORAL | Status: DC

## 2015-07-21 MED ORDER — PHENYTOIN SODIUM EXTENDED 100 MG PO CAPS
ORAL_CAPSULE | ORAL | Status: DC
Start: 2015-07-21 — End: 2015-08-02

## 2015-07-21 MED ORDER — TESTOSTERONE CYPIONATE 200 MG/ML IM SOLN: 400.0000 mg | INTRAMUSCULAR | Status: DC

## 2015-07-21 MED ORDER — LACOSAMIDE 150 MG PO TABS: 150.0000 mg | ORAL_TABLET | Freq: Two times a day (BID) | ORAL | Status: DC

## 2015-07-21 MED ORDER — METOPROLOL SUCCINATE ER 50 MG PO TB24: ORAL_TABLET | ORAL | Status: DC

## 2015-07-21 MED ORDER — AMITRIPTYLINE HCL 150 MG PO TABS: 300.0000 mg | ORAL_TABLET | Freq: Every day | ORAL | Status: DC

## 2015-07-21 MED ORDER — SENNOSIDES-DOCUSATE SODIUM 8.6-50 MG PO TABS: 1.0000 | ORAL_TABLET | Freq: Every day | ORAL | Status: DC

## 2015-07-21 NOTE — Progress Notes (Signed)
1:1 note:  D: Since last note, scheduled HS medications were administered.  PRN medication was administered for anxiety.  Pt is currently resting in his room with eyes closed.  Respirations are even and unlabored.   A: Pt was compliant with medication administration.  1:1 monitoring continues for safety per order. R: No distress noted.  Pt is safe.  Will continue to monitor and assess.

## 2015-07-21 NOTE — Progress Notes (Signed)
  [facility] Adult Case Management Discharge Plan :  Will you be returning to the same living situation after discharge:  Yes,  home At discharge, do you have transportation home?: Yes,  friend Do you have the ability to pay for your medications: Yes,  insurance  Release of information consent forms completed and in the chart;  Patient's signature needed at discharge.  Patient to Follow up at: Follow-up Information    Follow up with [facility] On 08/01/2015.   Why:  Monday at 1:00   Contact information:   [address] [place]  [phone]      Patient denies SI/HI: Yes,  yes    Land and Suicide Prevention discussed: Yes,  yes  Have you used any form of tobacco in the last 30 days? (Cigarettes, Smokeless Tobacco, Cigars, and/or Pipes): No  Has patient been referred to the Quitline?: N/A patient is not a smoker  [place], Barbaraann Rondo B 07/21/2015, 10:35 AM

## 2015-07-21 NOTE — Progress Notes (Signed)
Patient ID: [name], male   DOB: 10-02-1956, 59 y.o.   MRN: [id number] DIS - CHARGE  NOTE  ---  Discharge pt. As ordered .  Pt. DCd into care of girlfriend.  Sample medications were provided.  All possessions were returned.  Pt. Agreed to attend all out- pt. Appointments for medication management.  Pt. Denied SI / HI / HA or pain at time of DC.  Pt. Contracted for safety and promised to stay safe at home.  Pt. Agreed to seek help at ED , call 911 or return to [place] if suicidal thoughts returned.  --- A --- escort pt. To front lobby at 1225 hrs., 07/21/15.  --- R ---  Pt. Was safe at time of DC
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
## 2015-07-21 NOTE — [facility] Suicide Risk Assessment (Signed)
[facility] Discharge Suicide Risk Assessment Patient is a 59 year old male diagnosed with schizoaffective disorder .  Patient this morning reports that he's doing fairly well, adds that he will follow-up at the transitional care team through [facility]. He states that his gait is better, he's no longer in pain. He also reports that his foot has healed, denies any dizziness. He states that he would like to try [place] as he has tried worries medications her depression the past with no benefit.  On a scale of 0-10 with 0 being no symptoms in 10 being the worst, patient reports that his depression is currently a 6 out of 10. He states that he is doing better with his depression but still feels that there is room for improvement in regards to his mood. Patient also reports that he continues to struggle with constipation, would like to take Colace to help  Patient denies any symptoms of mania, psychosis, any thoughts of hurting himself or others.  Demographic Factors:  Male, Caucasian and Living alone  Total Time spent with patient: 30 minutes  Musculoskeletal: Strength & Muscle Tone: within normal limits Gait & Station: normal Patient leans: N/A  Psychiatric Specialty Exam: Physical Exam  Review of Systems  Constitutional: Negative.  Negative for fever, chills, weight loss, malaise/fatigue and diaphoresis.  HENT: Negative.  Negative for congestion and sore throat.   Eyes: Negative.  Negative for blurred vision, double vision and redness.  Respiratory: Negative.  Negative for cough, shortness of breath and wheezing.   Cardiovascular: Negative.  Negative for chest pain and palpitations.  Gastrointestinal: Positive for constipation. Negative for heartburn, nausea, vomiting, abdominal pain and diarrhea.  Genitourinary: Negative.  Negative for dysuria and urgency.  Musculoskeletal: Negative.  Negative for myalgias, joint pain and falls.  Skin: Negative.  Negative for rash.  Neurological: Negative.  Negative  for dizziness, seizures, loss of consciousness, weakness and headaches.  Endo/Heme/Allergies: Negative.  Negative for environmental allergies.  Psychiatric/Behavioral: Positive for depression. Negative for suicidal ideas, hallucinations and substance abuse. The patient is not nervous/anxious and does not have insomnia.     Blood pressure 91/57, pulse 75, temperature 98.7 F (37.1 C), temperature source Oral, resp. rate 20, height 5\' 11"  (1.803 m), weight 89.812 kg (198 lb).Body mass index is 27.63 kg/(m^2).  General Appearance: Casual  Eye Contact::  Good  Speech:  Clear and Coherent and Normal Rate  Volume:  Normal  Mood:  Depressed  Affect:  Congruent and Full Range  Thought Process:  Coherent, Goal Directed and Intact  Orientation:  Full (Time, Place, and Person)  Thought Content:  WDL  Suicidal Thoughts:  No  Homicidal Thoughts:  No  Memory:  Immediate;   Fair Recent;   Fair Remote;   Fair  Judgement:  Intact  Insight:  Shallow  Psychomotor Activity:  Normal  Concentration:  Fair  Recall:  [employer] of Knowledge:Fair  Language: Fair  Akathisia:  No  Handed:  Right  AIMS (if indicated):     Assets:  Desire for Improvement Housing  Sleep:  Number of Hours: 6  Cognition: WNL  ADL's:  Intact   Have you used any form of tobacco in the last 30 days? (Cigarettes, Smokeless Tobacco, Cigars, and/or Pipes): No  Has this patient used any form of tobacco in the last 30 days? (Cigarettes, Smokeless Tobacco, Cigars, and/or Pipes) No  Mental Status Per Nursing Assessment::   On Admission:     Current Mental Status by Physician: NA  Loss Factors: Decline  in physical health  Historical Factors: NA  Risk Reduction Factors:   Religious beliefs about death  Continued Clinical Symptoms:  More than one psychiatric diagnosis Previous Psychiatric Diagnoses and Treatments Medical Diagnoses and Treatments/Surgeries  Cognitive Features That Contribute To Risk:  Closed-mindedness     Suicide Risk:  Minimal: No identifiable suicidal ideation.  Patients presenting with no risk factors but with morbid ruminations; may be classified as minimal risk based on the severity of the depressive symptoms  Principal Problem: Schizoaffective disorder, depressive type with good prognostic features Discharge Diagnoses:  Patient Active Problem List   Diagnosis Date Noted  . Schizoaffective disorder, depressive type with good prognostic features [F25.1] 07/13/2015  . Abdominal pain [R10.9]   . Constipation [K59.00]   . Verruca [B07.9] 05/18/2015  . Seborrheic dermatitis of scalp [L21.8] 05/18/2015  . Blood poisoning [A41.9]   . MDD (major depressive disorder), recurrent severe, without psychosis [F33.2]   . OCD (obsessive compulsive disorder) [F42]   . Confusion [R41.0] 12/31/2014  . Seizures [R56.9]   . Schizoaffective disorder, depressive type [F25.1]   . Orthostatic hypotension [I95.1]   . Hyperlipidemia [E78.5]   . Falls [phone].XXXA]   . HLD (hyperlipidemia) [E78.5]   . Seizure [R56.9] 11/18/2014  . Schizoaffective disorder, unspecified type [F25.9]   . Essential hypertension [I10]   . Overdose of beta-adrenergic antagonist drug [T44.7X1A] 10/22/2014  . Altered mental status [R41.82]   . Overdose [T50.901A]   . Nocturnal enuresis [N39.44] 03/23/2014  . Benzodiazepine dependence, continuous [F13.20] 01/27/2014  . Opiate dependence [F11.20] 01/22/2014  . Severe major depression [F32.2] 01/21/2014  . Frozen shoulder syndrome [M75.00] 09/20/2013  . Somnolence [R40.0] 09/20/2013  . Schizoaffective disorder [F25.9] 09/20/2013  . Malaise and fatigue [R53.81, R53.83] 04/21/2013  . Presbyopia [H52.4] 04/25/2012  . Androgen deficiency [E29.1] 09/07/2011  . Constipation, chronic [K59.00] 09/07/2011  . Other specified disorders of liver [K76.89] 12/30/2008  . Overweight(278.02) [E66.3] 09/08/2008  . HYPERLIPIDEMIA [E78.5] 01/28/2008  . HYPERTENSION [I10] 01/28/2008  . Seizure  disorder [G40.909] 01/28/2008  . OSTEOPENIA [M89.9, M94.9] 07/01/2007    Follow-up Information    Follow up with [facility] On 08/01/2015.   Why:  Monday at 1:00   Contact information:   [address]  [place]  [phone]      Plan Of Care/Follow-up recommendations:  Activity:  as tolerated Diet:  heart healthy diet Other:  To work with TCT through [facility] Patient to take his medications regularly as prescribed and keep follow-up appointments Is patient on multiple antipsychotic therapies at discharge:  No   Has Patient had three or more failed trials of antipsychotic monotherapy by history:  No  Recommended Plan for Multiple Antipsychotic Therapies: NA    Hien Cunliffe 07/21/2015, 9:34 AM

## 2015-07-21 NOTE — Progress Notes (Signed)
1:1 note: D: Pt is resting in his room with his eyes closed.  Respirations are even and unlabored.   A: 1:1 monitoring continues for safety per order.   R: No distress noted.  Pt is safe.  Will continue to monitor per order.

## 2015-07-21 NOTE — Progress Notes (Signed)
Patient ID: [name], male   DOB: December 17, 1956, 59 y.o.   MRN: [id number] NURSE  NOTE-  1000 hrs. ,  07/21/15 ---  Pt. Remains on 1:1 for safety due to high fall risk.  Pt. Appears sad, depressed with reduced eye contact.  He contracts for safety while at [facility] but continues to consider self harm after he goes home.  Pt. Accepts all scheduled nedications  And seeks assistance while ambulating as needed.  Pt. [name] has NO falls during this time period and remains in bed most of time  --- A --  monitor pt. As ordered .  ---  R --  Pt. Remains safe on unit

## 2015-07-25 ENCOUNTER — Other Ambulatory Visit: Payer: Self-pay

## 2015-07-25 ENCOUNTER — Encounter (HOSPITAL_COMMUNITY): Payer: Self-pay

## 2015-07-25 ENCOUNTER — Emergency Department (HOSPITAL_COMMUNITY)
Admission: EM | Admit: 2015-07-25 | Discharge: 2015-07-26 | Disposition: A | Discharge status: Home / Self Care | Payer: Medicaid Other | Attending: Emergency Medicine | Admitting: Emergency Medicine

## 2015-07-25 ENCOUNTER — Emergency Department (HOSPITAL_COMMUNITY)
Admission: EM | Admit: 2015-07-25 | Discharge: 2015-07-25 | Disposition: A | Discharge status: Other Acute Inpatient Hospital | Payer: Medicaid Other | Source: Home / Self Care | Attending: Family Medicine | Admitting: Family Medicine

## 2015-07-25 ENCOUNTER — Emergency Department (HOSPITAL_COMMUNITY): Payer: Medicaid Other

## 2015-07-25 ENCOUNTER — Encounter (HOSPITAL_COMMUNITY): Payer: Self-pay | Admitting: Emergency Medicine

## 2015-07-25 DIAGNOSIS — H9190 Unspecified hearing loss, unspecified ear: Secondary | ICD-10-CM | POA: Insufficient documentation

## 2015-07-25 DIAGNOSIS — F259 Schizoaffective disorder, unspecified: Secondary | ICD-10-CM | POA: Insufficient documentation

## 2015-07-25 DIAGNOSIS — I10 Essential (primary) hypertension: Secondary | ICD-10-CM | POA: Diagnosis not present

## 2015-07-25 DIAGNOSIS — R42 Dizziness and giddiness: Secondary | ICD-10-CM | POA: Diagnosis not present

## 2015-07-25 DIAGNOSIS — R531 Weakness: Secondary | ICD-10-CM | POA: Diagnosis not present

## 2015-07-25 DIAGNOSIS — K219 Gastro-esophageal reflux disease without esophagitis: Secondary | ICD-10-CM | POA: Insufficient documentation

## 2015-07-25 DIAGNOSIS — Z8614 Personal history of Methicillin resistant Staphylococcus aureus infection: Secondary | ICD-10-CM | POA: Diagnosis not present

## 2015-07-25 DIAGNOSIS — F329 Major depressive disorder, single episode, unspecified: Secondary | ICD-10-CM | POA: Diagnosis not present

## 2015-07-25 DIAGNOSIS — Z7982 Long term (current) use of aspirin: Secondary | ICD-10-CM | POA: Diagnosis not present

## 2015-07-25 DIAGNOSIS — E785 Hyperlipidemia, unspecified: Secondary | ICD-10-CM | POA: Diagnosis not present

## 2015-07-25 DIAGNOSIS — Z79899 Other long term (current) drug therapy: Secondary | ICD-10-CM | POA: Diagnosis not present

## 2015-07-25 DIAGNOSIS — F419 Anxiety disorder, unspecified: Secondary | ICD-10-CM | POA: Diagnosis not present

## 2015-07-25 DIAGNOSIS — H81399 Other peripheral vertigo, unspecified ear: Secondary | ICD-10-CM | POA: Diagnosis not present

## 2015-07-25 LAB — COMPREHENSIVE METABOLIC PANEL
ALT: 14 U/L — ABNORMAL LOW (ref 17–63)
AST: 18 U/L (ref 15–41)
Albumin: 3.9 g/dL (ref 3.5–5.0)
Alkaline Phosphatase: 85 U/L (ref 38–126)
Anion gap: 10 (ref 5–15)
BUN: 8 mg/dL (ref 6–20)
CO2: 22 mmol/L (ref 22–32)
Calcium: 8.9 mg/dL (ref 8.9–10.3)
Chloride: 104 mmol/L (ref 101–111)
Creatinine, Ser: 0.85 mg/dL (ref 0.61–1.24)
GFR calc Af Amer: >60 mL/min (ref >60)
GFR calc non Af Amer: >60 mL/min (ref >60)
Glucose, Bld: 114 mg/dL — ABNORMAL HIGH (ref 65–99)
Potassium: 4 mmol/L (ref 3.5–5.1)
Sodium: 136 mmol/L (ref 135–145)
Total Bilirubin: 0.2 mg/dL — ABNORMAL LOW (ref 0.3–1.2)
Total Protein: 6.5 g/dL (ref 6.5–8.1)

## 2015-07-25 LAB — CBC WITH DIFFERENTIAL/PLATELET
Basophils Absolute: 0 10*3/uL (ref 0.0–0.1)
Basophils Relative: 0 % (ref 0–1)
Eosinophils Absolute: 0 10*3/uL (ref 0.0–0.7)
Eosinophils Relative: 0 % (ref 0–5)
HCT: 44.7 % (ref 39.0–52.0)
Hemoglobin: 15.7 g/dL (ref 13.0–17.0)
Lymphocytes Relative: 23 % (ref 12–46)
Lymphs Abs: 1.7 10*3/uL (ref 0.7–4.0)
MCH: 32.1 pg (ref 26.0–34.0)
MCHC: 35.1 g/dL (ref 30.0–36.0)
MCV: 91.4 fL (ref 78.0–100.0)
Monocytes Absolute: 0.5 10*3/uL (ref 0.1–1.0)
Monocytes Relative: 7 % (ref 3–12)
Neutro Abs: 5.2 10*3/uL (ref 1.7–7.7)
Neutrophils Relative %: 70 % (ref 43–77)
Platelets: 223 10*3/uL (ref 150–400)
RBC: 4.89 MIL/uL (ref 4.22–5.81)
RDW: 14.3 % (ref 11.5–15.5)
WBC: 7.4 10*3/uL (ref 4.0–10.5)

## 2015-07-25 LAB — I-STAT CHEM 8, ED
BUN: 10 mg/dL (ref 6–20)
Calcium, Ion: 1.09 mmol/L — ABNORMAL LOW (ref 1.13–1.30)
Chloride: 102 mmol/L (ref 101–111)
Creatinine, Ser: 0.8 mg/dL (ref 0.61–1.24)
Glucose, Bld: 107 mg/dL — ABNORMAL HIGH (ref 65–99)
HCT: 46 % (ref 39.0–52.0)
Hemoglobin: 15.6 g/dL (ref 13.0–17.0)
Potassium: 4 mmol/L (ref 3.5–5.1)
Sodium: 138 mmol/L (ref 135–145)
TCO2: 25 mmol/L (ref 0–100)

## 2015-07-25 LAB — I-STAT TROPONIN, ED: Troponin i, poc: 0.01 ng/mL (ref 0.00–0.08)

## 2015-07-25 LAB — APTT: aPTT: 23 seconds — ABNORMAL LOW (ref 24–37)

## 2015-07-25 LAB — PROTIME-INR
INR: 1.01 (ref 0.00–1.49)
Prothrombin Time: 13.5 seconds (ref 11.6–15.2)

## 2015-07-25 MED ORDER — SODIUM CHLORIDE 0.9 % IV SOLN
Freq: Once | INTRAVENOUS | Status: AC
  Administered 2015-07-25: 20:00:00 via INTRAVENOUS

## 2015-07-25 MED ORDER — LORAZEPAM 2 MG/ML IJ SOLN
2.0000 mg | Freq: Once | INTRAMUSCULAR | Status: AC
  Administered 2015-07-25: 2 mg via INTRAVENOUS
  Filled 2015-07-25: qty 1

## 2015-07-25 NOTE — ED Notes (Signed)
Pt arrives via [facility] from Urgent care c/o dizziness/weakness x 2 days. Pt reports n/v/d yesterday, dfenies v/d tonight, endorses nausea.  Pt denies CP, SOB.  Pt reports dizziness worse with movement, "walking into walls".  NAD noted at this time.

## 2015-07-25 NOTE — ED Notes (Signed)
Pt aware urine is needed. Unable to void at this time.

## 2015-07-25 NOTE — ED Provider Notes (Signed)
CSN: [id number]     Arrival date & time 07/25/15  2100 History   First MD Initiated Contact with Patient 07/25/15 2104     Chief Complaint  Patient presents with  . Dizziness  . Emesis     (Consider location/radiation/quality/duration/timing/severity/associated sxs/prior Treatment) Patient is a 59 y.o. male presenting with dizziness. The history is provided by the patient and the spouse. No language interpreter was used.  Dizziness Quality:  Room spinning and imbalance Severity:  Severe Onset quality:  Gradual Duration:  2 days Timing:  Intermittent Progression:  Unchanged Chronicity:  New Context: head movement   Relieved by:  Nothing Worsened by:  Movement Ineffective treatments:  None tried Associated symptoms: no chest pain, no diarrhea, no nausea, no palpitations, no shortness of breath and no vomiting   Risk factors: multiple medications   Risk factors: no anemia, no heart disease, no hx of stroke, no hx of vertigo, no Meniere's disease and no new medications     Past Medical History  Diagnosis Date  . Seizures   . Hypertension   . GERD (gastroesophageal reflux disease)   . Dyslipidemia   . Schizoaffective disorder   . Depression   . Anxiety   . MRSA carrier   . Constipation   . Memory loss   . Hearing loss    Past Surgical History  Procedure Laterality Date  . Self orchectomy    . Colonoscopy  2010  . Orif shoulder fracture    . Shoulder closed reduction Left 09/16/2013    Procedure: CLOSED REDUCTION SHOULDER;  Surgeon: Mauri Pole, MD;  Location: [facility];  Service: Orthopedics;  Laterality: Left;  . Shoulder hemi-arthroplasty Left 09/18/2013    Procedure: LEFT SHOULDER HEMI-ARTHROPLASTY;  Surgeon: Augustin Schooling, MD;  Location: [place];  Service: Orthopedics;  Laterality: Left;  . Carpal tunnel release     Family History  Problem Relation Age of Onset  . Stroke Father     Living at 53  . Stroke Mother     Stroke in late 69's. Died in her 36's    History  Substance Use Topics  . Smoking status: Never Smoker   . Smokeless tobacco: Never Used  . Alcohol Use: No    Review of Systems  Constitutional: Negative for fever, chills and fatigue.  HENT: Negative for trouble swallowing.   Eyes: Negative for visual disturbance.  Respiratory: Negative for shortness of breath.   Cardiovascular: Negative for chest pain and palpitations.  Gastrointestinal: Negative for nausea, vomiting, abdominal pain and diarrhea.  Genitourinary: Negative for dysuria and difficulty urinating.  Musculoskeletal: Negative for arthralgias and neck pain.  Skin: Negative for color change.  Neurological: Positive for dizziness.  Psychiatric/Behavioral: Negative for dysphoric mood.      Allergies  Codeine and Sulfa antibiotics  Home Medications   Prior to Admission medications   Medication Sig Start Date End Date Taking? Authorizing Provider  amitriptyline (ELAVIL) 150 MG tablet Take 2 tablets (300 mg total) by mouth at bedtime. 07/21/15   Benjamine Mola, FNP  aspirin EC 81 MG tablet Take 1 tablet (81 mg total) by mouth daily. 07/21/15   Benjamine Mola, FNP  clonazePAM (KLONOPIN) 2 MG tablet Take 1 tablet (2 mg total) by mouth 3 (three) times daily. 07/21/15   Benjamine Mola, FNP  Lacosamide (VIMPAT) 150 MG TABS Take 1 tablet (150 mg total) by mouth 2 (two) times daily. 07/21/15   Benjamine Mola, FNP  metoprolol succinate (TOPROL-XL) 50  MG 24 hr tablet TAKE 1 TABLET (50 MG TOTAL) BY MOUTH AT BEDTIME. TAKE WITH OR IMMEDIATELY FOLLOWING A MEAL. 07/21/15   Benjamine Mola, FNP  phenytoin (DILANTIN) 100 MG ER capsule TAKE 3 CAPSULES (300 MG TOTAL) BY MOUTH AT BEDTIME. 07/21/15   Benjamine Mola, FNP  polyethylene glycol (MIRALAX / GLYCOLAX) packet Take 17 g by mouth 2 (two) times daily as needed. For constipation. Mix into 8 ounces of fluid & drink 12/31/14   Leeanne Rio, MD  QUEtiapine (SEROQUEL) 300 MG tablet Take 2 tablets (600 mg total) by mouth at bedtime. 07/21/15    Benjamine Mola, FNP  senna-docusate (SENOKOT-S) 8.6-50 MG per tablet Take 1 tablet by mouth at bedtime. 07/21/15   Benjamine Mola, FNP  simvastatin (ZOCOR) 40 MG tablet Take 1 tablet (40 mg total) by mouth at bedtime. 07/21/15   Benjamine Mola, FNP  testosterone cypionate (DEPOTESTOSTERONE CYPIONATE) 200 MG/ML injection Inject 2 mLs (400 mg total) into the muscle every 14 (fourteen) days. 07/21/15   Benjamine Mola, FNP   There were no vitals taken for this visit. Physical Exam  Constitutional: He is oriented to person, place, and time. He appears well-developed and well-nourished. No distress.  HENT:  Head: Normocephalic and atraumatic.  Eyes: Conjunctivae and EOM are normal. Pupils are equal, round, and reactive to light.  Neck: Normal range of motion.  Cardiovascular: Normal rate and regular rhythm.  Exam reveals no gallop and no friction rub.   No murmur heard. Pulmonary/Chest: Effort normal and breath sounds normal. He has no wheezes. He has no rales. He exhibits no tenderness.  Abdominal: Soft. He exhibits no distension. There is no tenderness. There is no rebound.  Musculoskeletal: Normal range of motion.  Neurological: He is alert and oriented to person, place, and time. No cranial nerve deficit. Coordination normal.  Speech is goal-oriented. Moves limbs without ataxia. Extremity strength and sensation equal and intact bilaterally. Patient able to perform cerebellar functioning without difficulty.   Skin: Skin is warm and dry.  Psychiatric: He has a normal mood and affect. His behavior is normal.  Nursing note and vitals reviewed.   ED Course  Procedures (including critical care time) Labs Review Labs Reviewed  COMPREHENSIVE METABOLIC PANEL - Abnormal; Notable for the following:    Glucose, Bld 114 (*)    ALT 14 (*)    Total Bilirubin 0.2 (*)    All other components within normal limits  URINALYSIS, ROUTINE W REFLEX MICROSCOPIC (NOT AT [facility]) - Abnormal; Notable for the following:     APPearance CLOUDY (*)    Ketones, ur 15 (*)    All other components within normal limits  APTT - Abnormal; Notable for the following:    aPTT 23 (*)    All other components within normal limits  I-STAT CHEM 8, ED - Abnormal; Notable for the following:    Glucose, Bld 107 (*)    Calcium, Ion 1.09 (*)    All other components within normal limits  PROTIME-INR  CBC WITH DIFFERENTIAL/PLATELET  CBC WITH DIFFERENTIAL/PLATELET  I-STAT TROPOININ, ED  CBG MONITORING, ED    Imaging Review Ct Head Wo Contrast  07/25/2015   CLINICAL DATA:  Acute onset of dizziness, nausea and vertigo. Initial encounter.  EXAM: CT HEAD WITHOUT CONTRAST  TECHNIQUE: Contiguous axial images were obtained from the base of the skull through the vertex without intravenous contrast.  COMPARISON:  CT of the head performed 06/01/2015  FINDINGS: There is no  evidence of acute infarction, mass lesion, or intra- or extra-axial hemorrhage on CT.  Prominence of the sulci suggest mild cortical volume loss. Mild cerebellar atrophy is noted. Periventricular white matter change likely reflects small vessel ischemic microangiopathy.  The brainstem and fourth ventricle are within normal limits. The basal ganglia are unremarkable in appearance. The cerebral hemispheres demonstrate grossly normal gray-white differentiation. No mass effect or midline shift is seen.  There is no evidence of fracture; visualized osseous structures are unremarkable in appearance. The orbits are within normal limits. The paranasal sinuses and mastoid air cells are well-aerated. No significant soft tissue abnormalities are seen.  IMPRESSION: 1. No acute intracranial pathology seen on CT. 2. Mild cortical volume loss and scattered small vessel ischemic microangiopathy.   Electronically Signed   By: Garald Balding M.D.   On: 07/25/2015 22:20   Mr Brain Wo Contrast  07/26/2015   CLINICAL DATA:  Initial evaluation for acute nausea, dizziness for several days.  EXAM: MRI  HEAD WITHOUT CONTRAST  TECHNIQUE: Multiplanar, multiecho pulse sequences of the brain and surrounding structures were obtained without intravenous contrast.  COMPARISON:  Prior CT from earlier the same day.  FINDINGS: Mild diffuse prominence of the CSF containing spaces is compatible with generalized age-related cerebral atrophy. Mild T2/FLAIR hyperintense foci within the periventricular white matter, nonspecific, but most likely related to very minimal chronic small vessel ischemic disease.  No abnormal foci of restricted diffusion to suggest acute intracranial infarct. Gray-white matter differentiation maintained. Normal intravascular flow voids are preserved. No acute or chronic intracranial hemorrhage.  No mass lesion, midline shift, or mass effect. No hydrocephalus. No extra-axial fluid collection.  Craniocervical junction within normal limits. Pituitary gland normal.  No acute abnormality about the orbits. Mild opacity present within the right ethmoidal air cells. Paranasal sinuses are otherwise largely clear. No mastoid effusion. Inner ear structures within normal limits.  Bone marrow signal intensity normal. Scalp soft tissues unremarkable.  IMPRESSION: 1. No acute intracranial infarct or other abnormality identified. 2. Mild age-related cerebral atrophy with chronic microvascular ischemic disease.   Electronically Signed   By: Jeannine Boga M.D.   On: 07/26/2015 01:24     EKG Interpretation None      MDM   Final diagnoses:  Peripheral vertigo, unspecified laterality    9:17 PM Patient presents from Urgent care with a 2 day history of dizziness and gait changes. Patient will have further work up for new onset vertigo. Labs, urine, and CT head pending. Vitals stable and patient afebrile.   11:17 PM CT head and labs unremarkable for acute changes. Patient will have MRI to rule out ischemic changes causing symptoms.   1:43 AM Patient's MRI unremarkable for acute changes. Patient will  be treated for peripheral vertigo with claritin and meclizine. Patient will follow up with PCP for further evaluation.   Alvina Chou, PA-C 07/26/15 0144  Orpah Greek, MD 07/26/15 [id number]

## 2015-07-25 NOTE — ED Provider Notes (Signed)
CSN: [id number]     Arrival date & time 07/25/15  1919 History   None    Chief Complaint  Patient presents with  . Dizziness  . Nausea   (Consider location/radiation/quality/duration/timing/severity/associated sxs/prior Treatment) Patient is a 59 y.o. male presenting with dizziness. The history is provided by the patient and the spouse.  Dizziness Quality:  Imbalance and room spinning Severity:  Moderate Onset quality:  Gradual Duration:  2 days Progression:  Unchanged Chronicity:  New Context: ear pain   Context: not with loss of consciousness   Relieved by:  Being still Worsened by:  Movement Ineffective treatments:  None tried Associated symptoms: nausea   Associated symptoms: no chest pain, no headaches, no hearing loss, no palpitations, no tinnitus and no weakness   Risk factors: no hx of vertigo     Past Medical History  Diagnosis Date  . Seizures   . Hypertension   . GERD (gastroesophageal reflux disease)   . Dyslipidemia   . Schizoaffective disorder   . Depression   . Anxiety   . MRSA carrier   . Constipation   . Memory loss   . Hearing loss    Past Surgical History  Procedure Laterality Date  . Self orchectomy    . Colonoscopy  2010  . Orif shoulder fracture    . Shoulder closed reduction Left 09/16/2013    Procedure: CLOSED REDUCTION SHOULDER;  Surgeon: Mauri Pole, MD;  Location: [facility];  Service: Orthopedics;  Laterality: Left;  . Shoulder hemi-arthroplasty Left 09/18/2013    Procedure: LEFT SHOULDER HEMI-ARTHROPLASTY;  Surgeon: Augustin Schooling, MD;  Location: [place];  Service: Orthopedics;  Laterality: Left;  . Carpal tunnel release     Family History  Problem Relation Age of Onset  . Stroke Father     Living at 67  . Stroke Mother     Stroke in late 23's. Died in her 3's   History  Substance Use Topics  . Smoking status: Never Smoker   . Smokeless tobacco: Never Used  . Alcohol Use: No    Review of Systems  Constitutional: Negative.   Negative for fever.  HENT: Negative for congestion, hearing loss, rhinorrhea and tinnitus.   Respiratory: Negative.   Cardiovascular: Negative for chest pain, palpitations and leg swelling.  Gastrointestinal: Positive for nausea.  Neurological: Positive for dizziness. Negative for weakness and headaches.    Allergies  Codeine and Sulfa antibiotics  Home Medications   Prior to Admission medications   Medication Sig Start Date End Date Taking? Authorizing Provider  amitriptyline (ELAVIL) 150 MG tablet Take 2 tablets (300 mg total) by mouth at bedtime. 07/21/15   Benjamine Mola, FNP  aspirin EC 81 MG tablet Take 1 tablet (81 mg total) by mouth daily. 07/21/15   Benjamine Mola, FNP  clonazePAM (KLONOPIN) 2 MG tablet Take 1 tablet (2 mg total) by mouth 3 (three) times daily. 07/21/15   Benjamine Mola, FNP  Lacosamide (VIMPAT) 150 MG TABS Take 1 tablet (150 mg total) by mouth 2 (two) times daily. 07/21/15   Benjamine Mola, FNP  metoprolol succinate (TOPROL-XL) 50 MG 24 hr tablet TAKE 1 TABLET (50 MG TOTAL) BY MOUTH AT BEDTIME. TAKE WITH OR IMMEDIATELY FOLLOWING A MEAL. 07/21/15   Benjamine Mola, FNP  phenytoin (DILANTIN) 100 MG ER capsule TAKE 3 CAPSULES (300 MG TOTAL) BY MOUTH AT BEDTIME. 07/21/15   Benjamine Mola, FNP  polyethylene glycol (MIRALAX / GLYCOLAX) packet Take 17 g  by mouth 2 (two) times daily as needed. For constipation. Mix into 8 ounces of fluid & drink 12/31/14   Leeanne Rio, MD  QUEtiapine (SEROQUEL) 300 MG tablet Take 2 tablets (600 mg total) by mouth at bedtime. 07/21/15   Benjamine Mola, FNP  senna-docusate (SENOKOT-S) 8.6-50 MG per tablet Take 1 tablet by mouth at bedtime. 07/21/15   Benjamine Mola, FNP  simvastatin (ZOCOR) 40 MG tablet Take 1 tablet (40 mg total) by mouth at bedtime. 07/21/15   Benjamine Mola, FNP  testosterone cypionate (DEPOTESTOSTERONE CYPIONATE) 200 MG/ML injection Inject 2 mLs (400 mg total) into the muscle every 14 (fourteen) days. 07/21/15   Elyse Jarvis Withrow, FNP    BP 111/74 mmHg  Pulse 83  Temp(Src) 99.2 F (37.3 C) (Oral)  SpO2 94% Physical Exam  Constitutional: He is oriented to person, place, and time. He appears well-developed and well-nourished. He appears distressed.  HENT:  Head: Normocephalic.  Eyes: Conjunctivae and EOM are normal. Pupils are equal, round, and reactive to light.  Neck: Normal range of motion. Neck supple.  Cardiovascular: Normal rate, regular rhythm and normal heart sounds.   Pulmonary/Chest: Effort normal and breath sounds normal.  Lymphadenopathy:    He has no cervical adenopathy.  Neurological: He is alert and oriented to person, place, and time.  Skin: Skin is warm and dry.  Nursing note and vitals reviewed.   ED Course  Procedures (including critical care time) Labs Review Labs Reviewed - No data to display  Imaging Review No results found.   MDM   1. Vertigo    Sent for eval of acute vertigo.    Billy Fischer, MD 07/25/15 2016

## 2015-07-25 NOTE — ED Notes (Signed)
C/o nausea, dizzy x couple of days, has reportedly had to go on hands and knees to get around, no known ill contacts. Pale, denies pain at present

## 2015-07-26 ENCOUNTER — Other Ambulatory Visit: Payer: Self-pay | Admitting: Family Medicine

## 2015-07-26 LAB — URINALYSIS, ROUTINE W REFLEX MICROSCOPIC
Bilirubin Urine: NEGATIVE
Glucose, UA: NEGATIVE mg/dL
Hgb urine dipstick: NEGATIVE
Ketones, ur: 15 mg/dL — AB
Leukocytes, UA: NEGATIVE
Nitrite: NEGATIVE
Protein, ur: NEGATIVE mg/dL
Specific Gravity, Urine: 1.007 (ref 1.005–1.030)
Urobilinogen, UA: 0.2 mg/dL (ref 0.0–1.0)
pH: 6 (ref 5.0–8.0)

## 2015-07-26 MED ORDER — MECLIZINE HCL 50 MG PO TABS: 25.0000 mg | ORAL_TABLET | Freq: Three times a day (TID) | ORAL | Status: DC | PRN

## 2015-07-26 MED ORDER — MECLIZINE HCL 25 MG PO TABS
25.0000 mg | ORAL_TABLET | Freq: Once | ORAL | Status: AC
  Administered 2015-07-26: 25 mg via ORAL
  Filled 2015-07-26: qty 1

## 2015-07-26 MED ORDER — LORATADINE 10 MG PO TABS: 10.0000 mg | ORAL_TABLET | Freq: Every day | ORAL | Status: DC

## 2015-07-26 NOTE — Discharge Instructions (Signed)
Take claritin as needed for congestion. Take meclizine as needed for dizziness. Refer to attached documents for more information. Follow up with your doctor for further evaluation.

## 2015-07-29 ENCOUNTER — Emergency Department (HOSPITAL_COMMUNITY): Payer: Medicaid Other

## 2015-07-29 ENCOUNTER — Observation Stay (HOSPITAL_COMMUNITY)
Admission: EM | Admit: 2015-07-29 | Discharge: 2015-08-02 | Disposition: A | Discharge status: Home Health | Payer: Medicaid Other | Attending: Family Medicine | Admitting: Family Medicine

## 2015-07-29 ENCOUNTER — Other Ambulatory Visit: Payer: Self-pay

## 2015-07-29 ENCOUNTER — Encounter (HOSPITAL_COMMUNITY): Payer: Self-pay | Admitting: Emergency Medicine

## 2015-07-29 ENCOUNTER — Inpatient Hospital Stay (HOSPITAL_COMMUNITY): Payer: Medicaid Other

## 2015-07-29 DIAGNOSIS — F251 Schizoaffective disorder, depressive type: Secondary | ICD-10-CM | POA: Diagnosis present

## 2015-07-29 DIAGNOSIS — R0789 Other chest pain: Secondary | ICD-10-CM | POA: Insufficient documentation

## 2015-07-29 DIAGNOSIS — E291 Testicular hypofunction: Secondary | ICD-10-CM | POA: Diagnosis not present

## 2015-07-29 DIAGNOSIS — G40909 Epilepsy, unspecified, not intractable, without status epilepticus: Secondary | ICD-10-CM

## 2015-07-29 DIAGNOSIS — R29898 Other symptoms and signs involving the musculoskeletal system: Secondary | ICD-10-CM | POA: Diagnosis not present

## 2015-07-29 DIAGNOSIS — K219 Gastro-esophageal reflux disease without esophagitis: Secondary | ICD-10-CM | POA: Diagnosis not present

## 2015-07-29 DIAGNOSIS — K5900 Constipation, unspecified: Secondary | ICD-10-CM | POA: Diagnosis not present

## 2015-07-29 DIAGNOSIS — T420X1A Poisoning by hydantoin derivatives, accidental (unintentional), initial encounter: Secondary | ICD-10-CM | POA: Insufficient documentation

## 2015-07-29 DIAGNOSIS — E785 Hyperlipidemia, unspecified: Secondary | ICD-10-CM | POA: Diagnosis not present

## 2015-07-29 DIAGNOSIS — F259 Schizoaffective disorder, unspecified: Secondary | ICD-10-CM | POA: Insufficient documentation

## 2015-07-29 DIAGNOSIS — Z79899 Other long term (current) drug therapy: Secondary | ICD-10-CM | POA: Insufficient documentation

## 2015-07-29 DIAGNOSIS — Z885 Allergy status to narcotic agent status: Secondary | ICD-10-CM | POA: Diagnosis not present

## 2015-07-29 DIAGNOSIS — R4182 Altered mental status, unspecified: Secondary | ICD-10-CM | POA: Diagnosis not present

## 2015-07-29 DIAGNOSIS — R471 Dysarthria and anarthria: Secondary | ICD-10-CM | POA: Diagnosis not present

## 2015-07-29 DIAGNOSIS — R413 Other amnesia: Secondary | ICD-10-CM | POA: Diagnosis not present

## 2015-07-29 DIAGNOSIS — R42 Dizziness and giddiness: Secondary | ICD-10-CM

## 2015-07-29 DIAGNOSIS — M8580 Other specified disorders of bone density and structure, unspecified site: Secondary | ICD-10-CM | POA: Diagnosis not present

## 2015-07-29 DIAGNOSIS — F419 Anxiety disorder, unspecified: Secondary | ICD-10-CM | POA: Insufficient documentation

## 2015-07-29 DIAGNOSIS — R479 Unspecified speech disturbances: Secondary | ICD-10-CM | POA: Diagnosis not present

## 2015-07-29 DIAGNOSIS — Z882 Allergy status to sulfonamides status: Secondary | ICD-10-CM | POA: Insufficient documentation

## 2015-07-29 DIAGNOSIS — Z66 Do not resuscitate: Secondary | ICD-10-CM | POA: Insufficient documentation

## 2015-07-29 DIAGNOSIS — I10 Essential (primary) hypertension: Secondary | ICD-10-CM | POA: Insufficient documentation

## 2015-07-29 DIAGNOSIS — F329 Major depressive disorder, single episode, unspecified: Secondary | ICD-10-CM | POA: Diagnosis not present

## 2015-07-29 DIAGNOSIS — F42 Obsessive-compulsive disorder: Secondary | ICD-10-CM | POA: Diagnosis not present

## 2015-07-29 DIAGNOSIS — G459 Transient cerebral ischemic attack, unspecified: Secondary | ICD-10-CM | POA: Diagnosis not present

## 2015-07-29 DIAGNOSIS — H9190 Unspecified hearing loss, unspecified ear: Secondary | ICD-10-CM | POA: Insufficient documentation

## 2015-07-29 DIAGNOSIS — Z22322 Carrier or suspected carrier of Methicillin resistant Staphylococcus aureus: Secondary | ICD-10-CM | POA: Diagnosis not present

## 2015-07-29 DIAGNOSIS — H5500 Unspecified nystagmus: Secondary | ICD-10-CM | POA: Insufficient documentation

## 2015-07-29 DIAGNOSIS — T420X5A Adverse effect of hydantoin derivatives, initial encounter: Secondary | ICD-10-CM | POA: Insufficient documentation

## 2015-07-29 DIAGNOSIS — R4781 Slurred speech: Principal | ICD-10-CM | POA: Insufficient documentation

## 2015-07-29 DIAGNOSIS — Z7982 Long term (current) use of aspirin: Secondary | ICD-10-CM | POA: Diagnosis not present

## 2015-07-29 LAB — URINALYSIS, ROUTINE W REFLEX MICROSCOPIC
Bilirubin Urine: NEGATIVE
Glucose, UA: NEGATIVE mg/dL
Hgb urine dipstick: NEGATIVE
Ketones, ur: NEGATIVE mg/dL
Leukocytes, UA: NEGATIVE
Nitrite: NEGATIVE
Protein, ur: NEGATIVE mg/dL
Specific Gravity, Urine: 1.009 (ref 1.005–1.030)
Urobilinogen, UA: 0.2 mg/dL (ref 0.0–1.0)
pH: 7 (ref 5.0–8.0)

## 2015-07-29 LAB — COMPREHENSIVE METABOLIC PANEL
ALT: 16 U/L — ABNORMAL LOW (ref 17–63)
AST: 25 U/L (ref 15–41)
Albumin: 3.8 g/dL (ref 3.5–5.0)
Alkaline Phosphatase: 82 U/L (ref 38–126)
Anion gap: 9 (ref 5–15)
BUN: 8 mg/dL (ref 6–20)
CO2: 24 mmol/L (ref 22–32)
Calcium: 8.7 mg/dL — ABNORMAL LOW (ref 8.9–10.3)
Chloride: 105 mmol/L (ref 101–111)
Creatinine, Ser: 1.06 mg/dL (ref 0.61–1.24)
GFR calc Af Amer: >60 mL/min (ref >60)
GFR calc non Af Amer: >60 mL/min (ref >60)
Glucose, Bld: 133 mg/dL — ABNORMAL HIGH (ref 65–99)
Potassium: 4.2 mmol/L (ref 3.5–5.1)
Sodium: 138 mmol/L (ref 135–145)
Total Bilirubin: 0.7 mg/dL (ref 0.3–1.2)
Total Protein: 6.4 g/dL — ABNORMAL LOW (ref 6.5–8.1)

## 2015-07-29 LAB — I-STAT CHEM 8, ED
BUN: 12 mg/dL (ref 6–20)
Calcium, Ion: 1.01 mmol/L — ABNORMAL LOW (ref 1.13–1.30)
Chloride: 103 mmol/L (ref 101–111)
Creatinine, Ser: 0.9 mg/dL (ref 0.61–1.24)
Glucose, Bld: 128 mg/dL — ABNORMAL HIGH (ref 65–99)
HCT: 47 % (ref 39.0–52.0)
Hemoglobin: 16 g/dL (ref 13.0–17.0)
Potassium: 5.3 mmol/L — ABNORMAL HIGH (ref 3.5–5.1)
Sodium: 136 mmol/L (ref 135–145)
TCO2: 25 mmol/L (ref 0–100)

## 2015-07-29 LAB — CBC
HCT: 43.8 % (ref 39.0–52.0)
Hemoglobin: 15.3 g/dL (ref 13.0–17.0)
MCH: 32.1 pg (ref 26.0–34.0)
MCHC: 34.9 g/dL (ref 30.0–36.0)
MCV: 92 fL (ref 78.0–100.0)
Platelets: 195 10*3/uL (ref 150–400)
RBC: 4.76 MIL/uL (ref 4.22–5.81)
RDW: 14.3 % (ref 11.5–15.5)
WBC: 7.4 10*3/uL (ref 4.0–10.5)

## 2015-07-29 LAB — RAPID URINE DRUG SCREEN, HOSP PERFORMED
Amphetamines: NOT DETECTED
Barbiturates: NOT DETECTED
Benzodiazepines: POSITIVE — AB
Cocaine: NOT DETECTED
Opiates: NOT DETECTED
Tetrahydrocannabinol: NOT DETECTED

## 2015-07-29 LAB — DIFFERENTIAL
Basophils Absolute: 0 10*3/uL (ref 0.0–0.1)
Basophils Relative: 0 % (ref 0–1)
Eosinophils Absolute: 0.1 10*3/uL (ref 0.0–0.7)
Eosinophils Relative: 1 % (ref 0–5)
Lymphocytes Relative: 19 % (ref 12–46)
Lymphs Abs: 1.4 10*3/uL (ref 0.7–4.0)
Monocytes Absolute: 0.5 10*3/uL (ref 0.1–1.0)
Monocytes Relative: 7 % (ref 3–12)
Neutro Abs: 5.4 10*3/uL (ref 1.7–7.7)
Neutrophils Relative %: 73 % (ref 43–77)

## 2015-07-29 LAB — I-STAT TROPONIN, ED: Troponin i, poc: 0.01 ng/mL (ref 0.00–0.08)

## 2015-07-29 LAB — APTT: aPTT: 23 seconds — ABNORMAL LOW (ref 24–37)

## 2015-07-29 LAB — PROTIME-INR
INR: 1.06 (ref 0.00–1.49)
Prothrombin Time: 14 seconds (ref 11.6–15.2)

## 2015-07-29 LAB — ETHANOL: Alcohol, Ethyl (B): <5 mg/dL (ref <5)

## 2015-07-29 MED ORDER — SENNOSIDES-DOCUSATE SODIUM 8.6-50 MG PO TABS
1.0000 | ORAL_TABLET | Freq: Every evening | ORAL | Status: DC | PRN
  Administered 2015-07-29 – 2015-07-31 (×2): 1 via ORAL
  Filled 2015-07-29 (×2): qty 1

## 2015-07-29 MED ORDER — POLYETHYLENE GLYCOL 3350 17 G PO PACK: 17.0000 g | PACK | Freq: Two times a day (BID) | ORAL | Status: DC | PRN

## 2015-07-29 MED ORDER — LACOSAMIDE 150 MG PO TABS: 150.0000 mg | ORAL_TABLET | Freq: Two times a day (BID) | ORAL | Status: DC

## 2015-07-29 MED ORDER — STROKE: EARLY STAGES OF RECOVERY BOOK
Freq: Once | Status: AC
  Administered 2015-07-29: 19:00:00

## 2015-07-29 MED ORDER — ASPIRIN EC 81 MG PO TBEC
81.0000 mg | DELAYED_RELEASE_TABLET | Freq: Every day | ORAL | Status: DC
  Administered 2015-07-29 – 2015-08-02 (×5): 81 mg via ORAL
  Filled 2015-07-29 (×5): qty 1

## 2015-07-29 MED ORDER — AMITRIPTYLINE HCL 100 MG PO TABS
300.0000 mg | ORAL_TABLET | Freq: Every day | ORAL | Status: DC
  Administered 2015-07-29 – 2015-08-01 (×4): 300 mg via ORAL
  Filled 2015-07-29 (×2): qty 3
  Filled 2015-07-29: qty 12
  Filled 2015-07-29 (×3): qty 3

## 2015-07-29 MED ORDER — ACETAMINOPHEN 325 MG PO TABS: 650.0000 mg | ORAL_TABLET | Freq: Four times a day (QID) | ORAL | Status: DC | PRN

## 2015-07-29 MED ORDER — ENOXAPARIN SODIUM 40 MG/0.4ML ~~LOC~~ SOLN
40.0000 mg | SUBCUTANEOUS | Status: DC
  Administered 2015-07-29 – 2015-08-01 (×4): 40 mg via SUBCUTANEOUS
  Filled 2015-07-29 (×4): qty 0.4

## 2015-07-29 MED ORDER — PHENYTOIN SODIUM EXTENDED 100 MG PO CAPS
300.0000 mg | ORAL_CAPSULE | Freq: Every day | ORAL | Status: DC
  Administered 2015-07-29 – 2015-07-30 (×2): 300 mg via ORAL
  Filled 2015-07-29 (×2): qty 3

## 2015-07-29 MED ORDER — CLONAZEPAM 1 MG PO TABS
2.0000 mg | ORAL_TABLET | Freq: Three times a day (TID) | ORAL | Status: DC | PRN
  Administered 2015-07-30 – 2015-07-31 (×2): 2 mg via ORAL
  Filled 2015-07-29 (×2): qty 2

## 2015-07-29 MED ORDER — LORAZEPAM 2 MG/ML IJ SOLN
2.0000 mg | Freq: Once | INTRAMUSCULAR | Status: DC | PRN
  Administered 2015-07-29: 2 mg via INTRAVENOUS
  Filled 2015-07-29: qty 1

## 2015-07-29 MED ORDER — SODIUM CHLORIDE 0.9 % IV BOLUS (SEPSIS)
500.0000 mL | Freq: Once | INTRAVENOUS | Status: AC
  Administered 2015-07-29: 500 mL via INTRAVENOUS

## 2015-07-29 MED ORDER — MECLIZINE HCL 25 MG PO TABS
25.0000 mg | ORAL_TABLET | Freq: Three times a day (TID) | ORAL | Status: DC
  Administered 2015-07-29 – 2015-08-02 (×12): 25 mg via ORAL
  Filled 2015-07-29 (×12): qty 2

## 2015-07-29 MED ORDER — DOCUSATE SODIUM 100 MG PO CAPS
100.0000 mg | ORAL_CAPSULE | Freq: Two times a day (BID) | ORAL | Status: DC
  Administered 2015-07-29 – 2015-08-02 (×8): 100 mg via ORAL
  Filled 2015-07-29 (×8): qty 1

## 2015-07-29 MED ORDER — QUETIAPINE FUMARATE 300 MG PO TABS
600.0000 mg | ORAL_TABLET | Freq: Every day | ORAL | Status: DC
  Administered 2015-07-29 – 2015-08-01 (×4): 600 mg via ORAL
  Filled 2015-07-29 (×4): qty 3

## 2015-07-29 MED ORDER — ATORVASTATIN CALCIUM 40 MG PO TABS
40.0000 mg | ORAL_TABLET | Freq: Every day | ORAL | Status: DC
  Administered 2015-07-29 – 2015-08-01 (×4): 40 mg via ORAL
  Filled 2015-07-29 (×4): qty 1

## 2015-07-29 MED ORDER — LACOSAMIDE 50 MG PO TABS
150.0000 mg | ORAL_TABLET | Freq: Two times a day (BID) | ORAL | Status: DC
  Administered 2015-07-29 – 2015-08-02 (×8): 150 mg via ORAL
  Filled 2015-07-29 (×8): qty 3

## 2015-07-29 NOTE — ED Notes (Signed)
Pt from home via [facility] with c/o sudden onset slurred speech while having a conversation with his wife.  EMS reports pt has no other deficits, equal grips, confusion and repetitive questioning.  LSW 1420.  Pt reports being seen Monday for vertigo and nausea having a CT and MRI done.  Pt A&Ox4.

## 2015-07-29 NOTE — ED Notes (Signed)
Pt remains supine.  Will do RN swallow screen when outside the window at 1830.

## 2015-07-29 NOTE — ED Notes (Addendum)
Admitting at bedside 

## 2015-07-29 NOTE — Progress Notes (Signed)
Attempt to call report, no answer

## 2015-07-29 NOTE — ED Notes (Signed)
Pt reports dizziness and double vision has decreased.

## 2015-07-29 NOTE — Progress Notes (Addendum)
Patient arrived to room from ED. Per assessment, patient voiced that he fell last night from home. Bedrest at this time. Safety precautions reviewed with patient at this time. Patient to remain flat bedrest until 630 per ED RN. TELE applied and confirmed. Attending MD paged. Awaiting for call back and order.   Ave Filter, RN

## 2015-07-29 NOTE — Consult Note (Signed)
Admission H&P    Chief Complaint: Slurred speech and confusion.  HPI: [name] is an 59 y.o. male with a history of hypertension, hyperlipidemia, seizure disorder, schizoaffective disorder and anxiety, brought to the emergency room and code stroke status following acute onset of slurred speech and confusion at 1440 today. He has been experiencing vertigo for 5 days. He was seen in emergency room here on 07/25/2015 and underwent an MRI of his brain which was unremarkable. Claritin and meclizine. No focal weakness was noted. Patient has no history of stroke nor TIA. He has not been on antiplatelets therapy. CT scan of his head showed no acute intracranial abnormality. NIH stroke score was 3.  LSN: [id number] on 07/29/2015 tPA Given: No: Minimal deficits mRankin:  Past Medical History  Diagnosis Date  . Seizures   . Hypertension   . GERD (gastroesophageal reflux disease)   . Dyslipidemia   . Schizoaffective disorder   . Depression   . Anxiety   . MRSA carrier   . Constipation   . Memory loss   . Hearing loss     Past Surgical History  Procedure Laterality Date  . Self orchectomy    . Colonoscopy  2010  . Orif shoulder fracture    . Shoulder closed reduction Left 09/16/2013    Procedure: CLOSED REDUCTION SHOULDER;  Surgeon: Mauri Pole, MD;  Location: [facility];  Service: Orthopedics;  Laterality: Left;  . Shoulder hemi-arthroplasty Left 09/18/2013    Procedure: LEFT SHOULDER HEMI-ARTHROPLASTY;  Surgeon: Augustin Schooling, MD;  Location: [place];  Service: Orthopedics;  Laterality: Left;  . Carpal tunnel release      Family History  Problem Relation Age of Onset  . Stroke Father     Living at 7  . Stroke Mother     Stroke in late 70's. Died in her 31's   Social History:  reports that he has never smoked. He has never used smokeless tobacco. He reports that he does not drink alcohol or use illicit drugs.  Allergies:  Allergies  Allergen Reactions  . Codeine Nausea And Vomiting   . Sulfa Antibiotics Nausea And Vomiting   Medications: Patient's preadmission medications were reviewed by me.  ROS: History obtained from spouse  General ROS: negative for - chills, fatigue, fever, night sweats, weight gain or weight loss Psychological ROS: negative for - behavioral disorder, hallucinations, memory difficulties, mood swings or suicidal ideation Ophthalmic ROS: negative for - blurry vision, double vision, eye pain or loss of vision ENT ROS: negative for - epistaxis, nasal discharge, oral lesions, sore throat, tinnitus or vertigo Allergy and Immunology ROS: negative for - hives or itchy/watery eyes Hematological and Lymphatic ROS: negative for - bleeding problems, bruising or swollen lymph nodes Endocrine ROS: negative for - galactorrhea, hair pattern changes, polydipsia/polyuria or temperature intolerance Respiratory ROS: negative for - cough, hemoptysis, shortness of breath or wheezing Cardiovascular ROS: negative for - chest pain, dyspnea on exertion, edema or irregular heartbeat Gastrointestinal ROS: negative for - abdominal pain, diarrhea, hematemesis, nausea/vomiting or stool incontinence Genito-Urinary ROS: negative for - dysuria, hematuria, incontinence or urinary frequency/urgency Musculoskeletal ROS: negative for - joint swelling or muscular weakness Neurological ROS: as noted in HPI Dermatological ROS: negative for rash and skin lesion changes  Physical Examination: Blood pressure 114/72, pulse 71, resp. rate 14, SpO2 95 %.  HEENT-  Normocephalic, no lesions, without obvious abnormality.  Normal external eye and conjunctiva.  Normal TM's bilaterally.  Normal auditory canals and external ears. Normal  external nose, mucus membranes and septum.  Normal pharynx. Neck supple with no masses, nodes, nodules or enlargement. Cardiovascular - regular rate and rhythm, S1, S2 normal, no murmur, click, rub or gallop Lungs - chest clear, no wheezing, rales, normal  symmetric air entry Abdomen - soft, non-tender; bowel sounds normal; no masses,  no organomegaly Extremities - no joint deformities, effusion, or inflammation and no edema  Neurologic Examination: Mental Status: Alert, oriented, thought content appropriate.  Speech was slightly slurred without evidence of aphasia. Able to follow commands without difficulty. Cranial Nerves: II-Visual fields were normal. III/IV/VI-Pupils were equal and reacted normally to light. Extraocular movements were full and conjugate.    V/VII-no facial numbness and no facial weakness. VIII-normal. X-normal speech and symmetrical palatal movement. XI: trapezius strength/neck flexion strength normal bilaterally XII-midline tongue extension with normal strength. Motor: 5/5 bilaterally with normal tone and bulk Sensory: Normal throughout. Deep Tendon Reflexes: 1+ and symmetric. Plantars: Flexor bilaterally Cerebellar: Normal finger-to-nose testing; impaired heel-to-shin testing bilaterally. Carotid auscultation: Normal  Results for orders placed or performed during the hospital encounter of 07/29/15 (from the past 48 hour(s))  CBC     Status: None   Collection Time: 07/29/15  3:26 PM  Result Value Ref Range   WBC 7.4 4.0 - 10.5 K/uL   RBC 4.76 4.22 - 5.81 MIL/uL   Hemoglobin 15.3 13.0 - 17.0 g/dL   HCT 43.8 39.0 - 52.0 %   MCV 92.0 78.0 - 100.0 fL   MCH 32.1 26.0 - 34.0 pg   MCHC 34.9 30.0 - 36.0 g/dL   RDW 14.3 11.5 - 15.5 %   Platelets 195 150 - 400 K/uL  Differential     Status: None   Collection Time: 07/29/15  3:26 PM  Result Value Ref Range   Neutrophils Relative % 73 43 - 77 %   Neutro Abs 5.4 1.7 - 7.7 K/uL   Lymphocytes Relative 19 12 - 46 %   Lymphs Abs 1.4 0.7 - 4.0 K/uL   Monocytes Relative 7 3 - 12 %   Monocytes Absolute 0.5 0.1 - 1.0 K/uL   Eosinophils Relative 1 0 - 5 %   Eosinophils Absolute 0.1 0.0 - 0.7 K/uL   Basophils Relative 0 0 - 1 %   Basophils Absolute 0.0 0.0 - 0.1 K/uL   I-Stat Chem 8, ED  (not at [facility], [facility])     Status: Abnormal   Collection Time: 07/29/15  3:38 PM  Result Value Ref Range   Sodium 136 135 - 145 mmol/L   Potassium 5.3 (H) 3.5 - 5.1 mmol/L   Chloride 103 101 - 111 mmol/L   BUN 12 6 - 20 mg/dL   Creatinine, Ser 0.90 0.61 - 1.24 mg/dL   Glucose, Bld 128 (H) 65 - 99 mg/dL   Calcium, Ion 1.01 (L) 1.13 - 1.30 mmol/L   TCO2 25 0 - 100 mmol/L   Hemoglobin 16.0 13.0 - 17.0 g/dL   HCT 47.0 39.0 - 52.0 %   Ct Head Wo Contrast  07/29/2015   CLINICAL DATA:  Code stroke.  Sudden onset of slurred speech.  EXAM: CT HEAD WITHOUT CONTRAST  TECHNIQUE: Contiguous axial images were obtained from the base of the skull through the vertex without intravenous contrast.  COMPARISON:  MRI brain 07/25/2015 and CT scan 05/21/2014  FINDINGS: The ventricles are normal in size and configuration. No extra-axial fluid collections are identified. The gray-white differentiation is normal. No CT findings for acute intracranial process such as hemorrhage  or infarction. No mass lesions. The brainstem and cerebellum are grossly normal.  The bony structures are intact. The paranasal sinuses and mastoid air cells are clear. The globes are intact.  IMPRESSION: No acute intracranial findings.  No change since prior examination.   Electronically Signed   By: Marijo Sanes M.D.   On: 07/29/2015 15:28    Assessment: 59 y.o. male with multiple risk factors for stroke presenting with slurred speech and vision. Etiology is unclear. TIA or possible acute ischemic stroke cannot be ruled out.  Stroke Risk Factors - family history, hyperlipidemia and hypertension  Plan: 1. HgbA1c, fasting lipid panel 2. MRI, MRA  of the brain without contrast 3. PT consult, OT consult, Speech consult 4. Echocardiogram 5. Carotid dopplers 6. Prophylactic therapy-Antiplatelet med: Aspirin  7. Risk factor modification 8. Telemetry monitoring  C.R. Nicole Kindred, MD [name] [phone]  07/29/2015, 3:45 PM

## 2015-07-29 NOTE — H&P (Signed)
[facility] Admission History and Physical Service Pager: [phone]  Patient name: [name] Medical record number: [id number] Date of birth: [birth year] Age: 59 y.o. Gender: male  Primary Care Provider: Chrisandra Netters, MD Consultants: neuro Code Status: DNR/DNI (confirmed on admission)  Chief Complaint: slurred speech  Assessment and Plan: [name] is a 59 y.o. male presenting with slurred speech suspicious for TIA. PMH is significant for seizure disorder, severe schizoaffective disorder, depression, anxiety, HTN, HLD.  # Slurred speech/TIA: symptoms resolved in ED.  Seen by neuro in ED. Negative CT head. Of note he did get an MRI of brain 4 days ago from an ED visit for vertigo which showed only mild chronic age related atrophy. Neuro exam is normal in the ED and his vitals are stable. Pt is not on a daily aspirin.  - telemetry - repeat MRI, get MRA  - echo, carotid duplex - neuro checks overnight - risk strat labs: lipid, a1c - daily ASA - PT/OT/SLP evals - ativan 2mg  x 1 prior to MRI for claustrophobia  # Hypertension: - hold anti-HTN for 24 hours.   # Vertigo: has been bothering him within past week to point of having to crawl around.  - PT vestibular eval - meclizine 25mg  TID PRN  # Seizure disorder: do not feel current episode is seizure related.  - continue phenytoin 300mg  daily - continue lacosamide 150mg  BID  # Schizoaffective disorder/depression/anxiety: was recently at [facility] for SI end of July, has appt for f/u scheduled on 8/15. No current SI. - continue seroquel 600mg  qhs - continue amitriptyline 300mg  qhs - klonopin 2mg  TID PRN  # Chest wall pain: from fall. Do not feel this is cardiac related as it is reproducible, only after having hit his chest. - tylenol PRN  # HLD: - change simvastatin to atorvastatin  FEN/GI: reg diet after passing swallow screen / saline lock Prophylaxis: lovenox sq  Disposition:  admit  History of Present Illness: [name] is a 59 y.o. male presenting with slurred speech. Symptoms occurred around 2pm today suddenly. Pt reports that he realized he was having difficulty with speech. No loss of consciousness and no evidence of seizure symptoms reported by his wife, however he did fall from being dizzy and hit the front of his chest on an ice cream maker (denies hitting head). The speech was "jumbled" and neither patient nor wife were able to figure out what he was saying. Symptoms resolved after 1-2 hours while in the ED. Reports no weakness, but does say he has vertigo symptoms which have been going on for at least the past week. Vertigo is so bad that he has had to crawl around most of the time at home; he has been taking meclizine and loratadine for this without relief.   Review Of Systems: Per HPI with the following additions: +CP (on his skin where he hit when he fell), no SOB, +trouble swallowing but no pain or feeling of food getting stuck, no abdominal pain, no difficulty voiding. Otherwise 12 point review of systems was performed and was unremarkable.  Patient Active Problem List   Diagnosis Date Noted  . Schizoaffective disorder, depressive type with good prognostic features 07/13/2015  . Abdominal pain   . Constipation   . Verruca 05/18/2015  . Seborrheic dermatitis of scalp 05/18/2015  . Blood poisoning   . MDD (major depressive disorder), recurrent severe, without psychosis   . OCD (obsessive compulsive disorder)   . Confusion  12/31/2014  . Seizures   . Schizoaffective disorder, depressive type   . Orthostatic hypotension   . Hyperlipidemia   . Falls   . HLD (hyperlipidemia)   . Seizure 11/18/2014  . Schizoaffective disorder, unspecified type   . Essential hypertension   . Overdose of beta-adrenergic antagonist drug 10/22/2014  . Altered mental status   . Overdose   . Nocturnal enuresis 03/23/2014  . Benzodiazepine dependence, continuous  01/27/2014  . Opiate dependence 01/22/2014  . Severe major depression 01/21/2014  . Frozen shoulder syndrome 09/20/2013  . Somnolence 09/20/2013  . Schizoaffective disorder 09/20/2013  . Malaise and fatigue 04/21/2013  . Presbyopia 04/25/2012  . Androgen deficiency 09/07/2011  . Constipation, chronic 09/07/2011  . Other specified disorders of liver 12/30/2008  . Overweight(278.02) 09/08/2008  . HYPERLIPIDEMIA 01/28/2008  . HYPERTENSION 01/28/2008  . Seizure disorder 01/28/2008  . OSTEOPENIA 07/01/2007   Past Medical History: Past Medical History  Diagnosis Date  . Seizures   . Hypertension   . GERD (gastroesophageal reflux disease)   . Dyslipidemia   . Schizoaffective disorder   . Depression   . Anxiety   . MRSA carrier   . Constipation   . Memory loss   . Hearing loss    Past Surgical History: Past Surgical History  Procedure Laterality Date  . Self orchectomy    . Colonoscopy  2010  . Orif shoulder fracture    . Shoulder closed reduction Left 09/16/2013    Procedure: CLOSED REDUCTION SHOULDER;  Surgeon: Mauri Pole, MD;  Location: [facility];  Service: Orthopedics;  Laterality: Left;  . Shoulder hemi-arthroplasty Left 09/18/2013    Procedure: LEFT SHOULDER HEMI-ARTHROPLASTY;  Surgeon: Augustin Schooling, MD;  Location: [facility];  Service: Orthopedics;  Laterality: Left;  . Carpal tunnel release     Social History: Social History  Substance Use Topics  . Smoking status: Never Smoker   . Smokeless tobacco: Never Used  . Alcohol Use: No   Additional social history: lives at home with wife  Please also refer to relevant sections of EMR.  Family History: Family History  Problem Relation Age of Onset  . Stroke Father     Living at 33  . Stroke Mother     Stroke in late 15's. Died in her 43's   Allergies and Medications: Allergies  Allergen Reactions  . Codeine Nausea And Vomiting  . Sulfa Antibiotics Nausea And Vomiting   No current facility-administered  medications on file prior to encounter.   Current Outpatient Prescriptions on File Prior to Encounter  Medication Sig Dispense Refill  . amitriptyline (ELAVIL) 150 MG tablet Take 2 tablets (300 mg total) by mouth at bedtime. 60 tablet 3  . aspirin EC 81 MG tablet Take 1 tablet (81 mg total) by mouth daily.    . clonazePAM (KLONOPIN) 2 MG tablet Take 1 tablet (2 mg total) by mouth 3 (three) times daily. (Patient taking differently: Take 3 mg by mouth 3 (three) times daily. ) 42 tablet 0  . Lacosamide (VIMPAT) 150 MG TABS Take 1 tablet (150 mg total) by mouth 2 (two) times daily. 28 tablet 0  . loratadine (CLARITIN) 10 MG tablet Take 1 tablet (10 mg total) by mouth daily. 30 tablet 0  . meclizine (ANTIVERT) 50 MG tablet Take 0.5 tablets (25 mg total) by mouth 3 (three) times daily as needed for dizziness. 30 tablet 0  . metoprolol succinate (TOPROL-XL) 50 MG 24 hr tablet TAKE 1 TABLET (50 MG TOTAL) BY  MOUTH AT BEDTIME. TAKE WITH OR IMMEDIATELY FOLLOWING A MEAL. 30 tablet 1  . phenytoin (DILANTIN) 100 MG ER capsule TAKE 3 CAPSULES (300 MG TOTAL) BY MOUTH AT BEDTIME. 42 capsule 1  . polyethylene glycol (MIRALAX / GLYCOLAX) packet Take 17 g by mouth 2 (two) times daily as needed. For constipation. Mix into 8 ounces of fluid & drink 100 each 6  . QUEtiapine (SEROQUEL) 300 MG tablet Take 2 tablets (600 mg total) by mouth at bedtime. 60 tablet 0  . senna-docusate (SENOKOT-S) 8.6-50 MG per tablet Take 1 tablet by mouth at bedtime. 30 tablet 0  . simvastatin (ZOCOR) 40 MG tablet Take 1 tablet (40 mg total) by mouth at bedtime. 30 tablet 2  . testosterone cypionate (DEPOTESTOSTERONE CYPIONATE) 200 MG/ML injection Inject 2 mLs (400 mg total) into the muscle every 14 (fourteen) days. (Patient taking differently: Inject 400 mg into the muscle every 14 (fourteen) days. Friend states he had this medication on 07-21-15) 10 mL 0    Objective: BP 125/73 mmHg  Pulse 72  Resp 13  SpO2 95% Exam: General: NAD Eyes:  PERRL, EOMI, conjunctiva anicteric ENTM: MMM, no oropharyngeal lesions Neck: supple, no thyromegaly Cardiovascular: RRR, normal heart sounds, no murmurs. 2+ radial, DP pulses bilaterally. No LE edema Respiratory: CTAB, normal effort Abdomen: soft, nontender nondistended normal bowel sounds MSK: normal muscle bulk/tone, no deformities Skin: no rashes, small abrasion middle of chest Neuro: alert and oriented x3. CN2-12 normal. No pronator drift. Strength normal 5/5 bilaterally UE and LE. No nystagmus noted on patient sitting up Psych: mood appears normal, he has normal thought content and speech currently. No AV hallucinations.  Labs and Imaging: CBC BMET   Recent Labs Lab 07/29/15 1526 07/29/15 1538  WBC 7.4  --   HGB 15.3 16.0  HCT 43.8 47.0  PLT 195  --     Recent Labs Lab 07/29/15 1526 07/29/15 1538  NA 138 136  K 4.2 5.3*  CL 105 103  CO2 24  --   BUN 8 12  CREATININE 1.06 0.90  GLUCOSE 133* 128*  CALCIUM 8.7*  --      Ct Head Wo Contrast  07/29/2015   CLINICAL DATA:  Code stroke.  Sudden onset of slurred speech.  EXAM: CT HEAD WITHOUT CONTRAST  TECHNIQUE: Contiguous axial images were obtained from the base of the skull through the vertex without intravenous contrast.  COMPARISON:  MRI brain 07/25/2015 and CT scan 05/21/2014  FINDINGS: The ventricles are normal in size and configuration. No extra-axial fluid collections are identified. The gray-white differentiation is normal. No CT findings for acute intracranial process such as hemorrhage or infarction. No mass lesions. The brainstem and cerebellum are grossly normal.  The bony structures are intact. The paranasal sinuses and mastoid air cells are clear. The globes are intact.  IMPRESSION: No acute intracranial findings.  No change since prior examination.   Electronically Signed   By: Marijo Sanes M.D.   On: 07/29/2015 15:28   Ct Head Wo Contrast  07/25/2015   CLINICAL DATA:  Acute onset of dizziness, nausea and  vertigo. Initial encounter.  EXAM: CT HEAD WITHOUT CONTRAST  TECHNIQUE: Contiguous axial images were obtained from the base of the skull through the vertex without intravenous contrast.  COMPARISON:  CT of the head performed 06/01/2015  FINDINGS: There is no evidence of acute infarction, mass lesion, or intra- or extra-axial hemorrhage on CT.  Prominence of the sulci suggest mild cortical volume loss. Mild cerebellar atrophy  is noted. Periventricular white matter change likely reflects small vessel ischemic microangiopathy.  The brainstem and fourth ventricle are within normal limits. The basal ganglia are unremarkable in appearance. The cerebral hemispheres demonstrate grossly normal gray-white differentiation. No mass effect or midline shift is seen.  There is no evidence of fracture; visualized osseous structures are unremarkable in appearance. The orbits are within normal limits. The paranasal sinuses and mastoid air cells are well-aerated. No significant soft tissue abnormalities are seen.  IMPRESSION: 1. No acute intracranial pathology seen on CT. 2. Mild cortical volume loss and scattered small vessel ischemic microangiopathy.   Electronically Signed   By: Garald Balding M.D.   On: 07/25/2015 22:20   Mr Brain Wo Contrast  07/26/2015   CLINICAL DATA:  Initial evaluation for acute nausea, dizziness for several days.  EXAM: MRI HEAD WITHOUT CONTRAST  TECHNIQUE: Multiplanar, multiecho pulse sequences of the brain and surrounding structures were obtained without intravenous contrast.  COMPARISON:  Prior CT from earlier the same day.  FINDINGS: Mild diffuse prominence of the CSF containing spaces is compatible with generalized age-related cerebral atrophy. Mild T2/FLAIR hyperintense foci within the periventricular white matter, nonspecific, but most likely related to very minimal chronic small vessel ischemic disease.  No abnormal foci of restricted diffusion to suggest acute intracranial infarct. Gray-white  matter differentiation maintained. Normal intravascular flow voids are preserved. No acute or chronic intracranial hemorrhage.  No mass lesion, midline shift, or mass effect. No hydrocephalus. No extra-axial fluid collection.  Craniocervical junction within normal limits. Pituitary gland normal.  No acute abnormality about the orbits. Mild opacity present within the right ethmoidal air cells. Paranasal sinuses are otherwise largely clear. No mastoid effusion. Inner ear structures within normal limits.  Bone marrow signal intensity normal. Scalp soft tissues unremarkable.  IMPRESSION: 1. No acute intracranial infarct or other abnormality identified. 2. Mild age-related cerebral atrophy with chronic microvascular ischemic disease.   Electronically Signed   By: Jeannine Boga M.D.   On: 07/26/2015 01:24     Leone Brand, MD 07/29/2015, 5:00 PM PGY-3, [facility] pager: [phone], text pages welcome

## 2015-07-29 NOTE — Code Documentation (Signed)
59yo male arriving to [facility] via [place] at 52.  EMS reports that patient had sudden onset slurred speech at 1420 witnessed by his wife.  EMS called and activated a Code Stroke.  Patient to CT on arrival.  Stroke team to the bedside.  CT completed and patient to D34.  NIHSS 3, see documentation for details and code stroke times.  Patient with dysarthria and bilateral lower extremity ataxia on exam.  Phlebotomist to the bedside to draw labs.  IVF bolus given.  Dr. Nicole Kindred to the bedside.  No acute stroke treatment at this time, however, patient remains in the window to treat with tPA until 1850 per Dr. Nicole Kindred.  Of note, patient with h/o seizures and recent ED visit for vertigo.  Patient's wife reports patient has had vertigo since Sunday and was noted to have unsteady gait this morning.  Bedside handoff with ED RN Levada Dy.

## 2015-07-29 NOTE — ED Notes (Signed)
Pt now reporting "everything is moving."  The pt is continuing to have vertigo.

## 2015-07-29 NOTE — ED Notes (Signed)
Attempted report 

## 2015-07-29 NOTE — ED Provider Notes (Signed)
CSN: [id number]     Arrival date & time 07/29/15  1512 History   First MD Initiated Contact with Patient 07/29/15 1528     Chief Complaint  Patient presents with  . Code Stroke     (Consider location/radiation/quality/duration/timing/severity/associated sxs/prior Treatment) The history is provided by the patient and the EMS personnel. No language interpreter was used.  Patient is a 59 year old male with past medical history of seizure disorder, hypertension, hyperlipidemia who presents today with speech abnormality that started at approximately 1425 today. Per EMS wife briefly walked out of the room and when she came back the patient was notably speaking in a disorganized manner. She relates that her speech did not make any sense. She was the patient has a history of seizures however this is not typical of his seizures. She describes his seizures as focal without any effect on the speech in the postictal stage. EMS denies any known preceding headache or fevers or chills. Patient has not had any weakness in any of his 4 extremities. Vital signs were stable in route. Code stroke was called prior to patient's arrival and patient was taken immediately to CT scanner for stroke imaging studies. Patient was notably awake and alert although he continues to have his speech abnormality as described above. No associated worsening or alleviating factors or associated with this illness.  Past Medical History  Diagnosis Date  . Seizures   . Hypertension   . GERD (gastroesophageal reflux disease)   . Dyslipidemia   . Schizoaffective disorder   . Depression   . Anxiety   . MRSA carrier   . Constipation   . Memory loss   . Hearing loss    Past Surgical History  Procedure Laterality Date  . Self orchectomy    . Colonoscopy  2010  . Orif shoulder fracture    . Shoulder closed reduction Left 09/16/2013    Procedure: CLOSED REDUCTION SHOULDER;  Surgeon: Mauri Pole, MD;  Location: [facility];  Service:  Orthopedics;  Laterality: Left;  . Shoulder hemi-arthroplasty Left 09/18/2013    Procedure: LEFT SHOULDER HEMI-ARTHROPLASTY;  Surgeon: Augustin Schooling, MD;  Location: [facility];  Service: Orthopedics;  Laterality: Left;  . Carpal tunnel release     Family History  Problem Relation Age of Onset  . Stroke Father     Living at 83  . Stroke Mother     Stroke in late 89's. Died in her 17's   Social History  Substance Use Topics  . Smoking status: Never Smoker   . Smokeless tobacco: Never Used  . Alcohol Use: No    Review of Systems  Constitutional: Negative for fever and chills.  HENT: Negative for congestion and rhinorrhea.   Respiratory: Negative for shortness of breath.   Cardiovascular: Negative for chest pain and leg swelling.  Neurological: Positive for speech difficulty. Negative for seizures (wife denies any known seizure activity), syncope, facial asymmetry, weakness and headaches.  All other systems reviewed and are negative.     Allergies  Codeine and Sulfa antibiotics  Home Medications   Prior to Admission medications   Medication Sig Start Date End Date Taking? Authorizing Provider  amitriptyline (ELAVIL) 150 MG tablet Take 2 tablets (300 mg total) by mouth at bedtime. 07/21/15  Yes Benjamine Mola, FNP  clonazePAM (KLONOPIN) 2 MG tablet Take 1 tablet (2 mg total) by mouth 3 (three) times daily. Patient taking differently: Take 2 mg by mouth See admin instructions. Take 1 tablet (2 mg)  every night at bedtime, may take an additional tablet during the day if needed for anxiety 07/21/15  Yes Benjamine Mola, FNP  Lacosamide (VIMPAT) 150 MG TABS Take 1 tablet (150 mg total) by mouth 2 (two) times daily. 07/21/15  Yes Benjamine Mola, FNP  loratadine (CLARITIN) 10 MG tablet Take 1 tablet (10 mg total) by mouth daily. 07/26/15  Yes Kaitlyn Szekalski, PA-C  meclizine (ANTIVERT) 25 MG tablet Take 25 mg by mouth 3 (three) times daily.  07/26/15  Yes Historical Provider, MD  metoprolol  succinate (TOPROL-XL) 50 MG 24 hr tablet TAKE 1 TABLET (50 MG TOTAL) BY MOUTH AT BEDTIME. TAKE WITH OR IMMEDIATELY FOLLOWING A MEAL. 07/27/15  Yes Leeanne Rio, MD  phenytoin (DILANTIN) 100 MG ER capsule TAKE 3 CAPSULES (300 MG TOTAL) BY MOUTH AT BEDTIME. 07/21/15  Yes Benjamine Mola, FNP  polyethylene glycol (MIRALAX / GLYCOLAX) packet Take 17 g by mouth 2 (two) times daily as needed. For constipation. Mix into 8 ounces of fluid & drink Patient taking differently: Take 17 g by mouth 2 (two) times daily as needed (constipation). . Mix into 8 ounces of fluid & drink 12/31/14  Yes Leeanne Rio, MD  QUEtiapine (SEROQUEL) 300 MG tablet Take 2 tablets (600 mg total) by mouth at bedtime. 07/21/15  Yes Benjamine Mola, FNP  senna-docusate (SENOKOT-S) 8.6-50 MG per tablet Take 1 tablet by mouth at bedtime. Patient taking differently: Take 1 tablet by mouth at bedtime as needed (constipation).  07/21/15  Yes Benjamine Mola, FNP  simvastatin (ZOCOR) 40 MG tablet Take 1 tablet (40 mg total) by mouth at bedtime. 07/21/15  Yes Benjamine Mola, FNP  testosterone cypionate (DEPOTESTOSTERONE CYPIONATE) 200 MG/ML injection Inject 2 mLs (400 mg total) into the muscle every 14 (fourteen) days. Patient taking differently: Inject 400 mg into the muscle every 14 (fourteen) days. Every other Thursday -next injection was due 07/27/15 07/21/15  Yes Benjamine Mola, FNP  aspirin EC 81 MG tablet Take 1 tablet (81 mg total) by mouth daily. Patient not taking: Reported on 07/29/2015 07/21/15   Benjamine Mola, FNP  meclizine (ANTIVERT) 50 MG tablet Take 0.5 tablets (25 mg total) by mouth 3 (three) times daily as needed for dizziness. Patient not taking: Reported on 07/29/2015 07/26/15   Alvina Chou, PA-C   BP 128/71 mmHg  Pulse 84  Temp(Src) 97.8 F (36.6 C) (Oral)  Resp 20  SpO2 94% Physical Exam  Constitutional: No distress.  HENT:  Head: Normocephalic and atraumatic.  Eyes: Conjunctivae and EOM are normal.  Neck:  Normal range of motion. Neck supple.  Abdominal: Soft. There is no tenderness.  Musculoskeletal: Normal range of motion. He exhibits no tenderness.  Neurological: He is alert. GCS eye subscore is 4. GCS verbal subscore is 5. GCS motor subscore is 6.  5/5 strength and sensation throughout   Skin: Skin is dry. He is not diaphoretic.  Psychiatric: His speech is slurred.  Nursing note and vitals reviewed.   ED Course  Procedures (including critical care time) Labs Review Labs Reviewed  APTT - Abnormal; Notable for the following:    aPTT 23 (*)    All other components within normal limits  COMPREHENSIVE METABOLIC PANEL - Abnormal; Notable for the following:    Glucose, Bld 133 (*)    Calcium 8.7 (*)    Total Protein 6.4 (*)    ALT 16 (*)    All other components within normal limits  URINE RAPID  DRUG SCREEN, HOSP PERFORMED - Abnormal; Notable for the following:    Benzodiazepines POSITIVE (*)    All other components within normal limits  BASIC METABOLIC PANEL - Abnormal; Notable for the following:    Calcium 8.3 (*)    All other components within normal limits  PHENYTOIN LEVEL, TOTAL - Abnormal; Notable for the following:    Phenytoin Lvl 32.2 (*)    All other components within normal limits  I-STAT CHEM 8, ED - Abnormal; Notable for the following:    Potassium 5.3 (*)    Glucose, Bld 128 (*)    Calcium, Ion 1.01 (*)    All other components within normal limits  ETHANOL  PROTIME-INR  CBC  DIFFERENTIAL  URINALYSIS, ROUTINE W REFLEX MICROSCOPIC (NOT AT [facility])  LIPID PANEL  HEMOGLOBIN A1C  AMITRIPTYLINE LEVEL  I-STAT TROPOININ, ED    Imaging Review Ct Head Wo Contrast  07/29/2015   CLINICAL DATA:  Code stroke.  Sudden onset of slurred speech.  EXAM: CT HEAD WITHOUT CONTRAST  TECHNIQUE: Contiguous axial images were obtained from the base of the skull through the vertex without intravenous contrast.  COMPARISON:  MRI brain 07/25/2015 and CT scan 05/21/2014  FINDINGS: The  ventricles are normal in size and configuration. No extra-axial fluid collections are identified. The gray-white differentiation is normal. No CT findings for acute intracranial process such as hemorrhage or infarction. No mass lesions. The brainstem and cerebellum are grossly normal.  The bony structures are intact. The paranasal sinuses and mastoid air cells are clear. The globes are intact.  IMPRESSION: No acute intracranial findings.  No change since prior examination.   Electronically Signed   By: Marijo Sanes M.D.   On: 07/29/2015 15:28   Mr Brain Wo Contrast  07/29/2015   CLINICAL DATA:  TIA. Acute onset slurred speech and confusion earlier today. Vertigo for 5 days. History of seizure disorder.  EXAM: MRI HEAD WITHOUT CONTRAST  MRA HEAD WITHOUT CONTRAST  TECHNIQUE: Multiplanar, multiecho pulse sequences of the brain and surrounding structures were obtained without intravenous contrast. Angiographic images of the head were obtained using MRA technique without contrast.  COMPARISON:  Head CT 07/29/2015.  Head MRI 07/25/2015.  FINDINGS: MRI HEAD FINDINGS  Dedicated thin section imaging through the temporal lobes demonstrates normal volume and signal of the hippocampi. There is no evidence of acute infarct, intracranial hemorrhage, mass, midline shift, or extra-axial fluid collection. There is mild generalized cerebral atrophy. Scattered, small foci of T2 hyperintensity in the cerebral white matter are unchanged from the recent prior MRI and nonspecific but compatible with minimal chronic small vessel ischemic disease.  Orbits are unremarkable. Paranasal sinuses and mastoid air cells are clear. Major intracranial vascular flow voids are preserved.  MRA HEAD FINDINGS  The visualized distal vertebral arteries are patent with the right being dominant. Right PICA origin is patent. Left AICA is patent and dominant. SCA origins are patent, although the left SCA is small and not well evaluated. Basilar artery is  patent without stenosis. There are fetal type origins of both PCAs. No significant PCA stenosis is seen.  Internal carotid arteries are patent from skullbase to carotid termini. ACAs and MCAs are unremarkable. No intracranial aneurysm is identified.  IMPRESSION: 1. Unchanged appearance of the brain without evidence of acute intracranial abnormality. 2. No major intracranial arterial occlusion or significant stenosis.   Electronically Signed   By: Logan Bores   On: 07/29/2015 21:40   Mr Jodene Nam Head/brain Wo Cm  07/29/2015  CLINICAL DATA:  TIA. Acute onset slurred speech and confusion earlier today. Vertigo for 5 days. History of seizure disorder.  EXAM: MRI HEAD WITHOUT CONTRAST  MRA HEAD WITHOUT CONTRAST  TECHNIQUE: Multiplanar, multiecho pulse sequences of the brain and surrounding structures were obtained without intravenous contrast. Angiographic images of the head were obtained using MRA technique without contrast.  COMPARISON:  Head CT 07/29/2015.  Head MRI 07/25/2015.  FINDINGS: MRI HEAD FINDINGS  Dedicated thin section imaging through the temporal lobes demonstrates normal volume and signal of the hippocampi. There is no evidence of acute infarct, intracranial hemorrhage, mass, midline shift, or extra-axial fluid collection. There is mild generalized cerebral atrophy. Scattered, small foci of T2 hyperintensity in the cerebral white matter are unchanged from the recent prior MRI and nonspecific but compatible with minimal chronic small vessel ischemic disease.  Orbits are unremarkable. Paranasal sinuses and mastoid air cells are clear. Major intracranial vascular flow voids are preserved.  MRA HEAD FINDINGS  The visualized distal vertebral arteries are patent with the right being dominant. Right PICA origin is patent. Left AICA is patent and dominant. SCA origins are patent, although the left SCA is small and not well evaluated. Basilar artery is patent without stenosis. There are fetal type origins of both  PCAs. No significant PCA stenosis is seen.  Internal carotid arteries are patent from skullbase to carotid termini. ACAs and MCAs are unremarkable. No intracranial aneurysm is identified.  IMPRESSION: 1. Unchanged appearance of the brain without evidence of acute intracranial abnormality. 2. No major intracranial arterial occlusion or significant stenosis.   Electronically Signed   By: Logan Bores   On: 07/29/2015 21:40   I, Theodosia Quay, personally reviewed and evaluated these images and lab results as part of my medical decision-making.   EKG Interpretation   Date/Time:  Friday July 29 2015 15:26:01 EDT Ventricular Rate:  73 PR Interval:  190 QRS Duration: 122 QT Interval:  415 QTC Calculation: 457 R Axis:   -45 Text Interpretation:  Sinus rhythm Nonspecific IVCD with LAD ED PHYSICIAN  INTERPRETATION AVAILABLE IN [facility] Confirmed by TEST, Record  ([id number]) on 07/30/2015 2:40:51 PM      MDM   Final diagnoses:  TIA (transient ischemic attack)  Transient cerebral ischemia, unspecified transient cerebral ischemia type    Patient is a 59 year old male with past medical history of seizure disorder, hypertension, hyperlipidemia who presents today with speech abnormality that started at approximately 1425 today. Came in as code stroke. No findings on CT today that would indicate hemorrhagic etiology. Discussed with neurology who does not recommend TPA at this time. Continues to have slurred speech, possibly TIA vs functional deficit. Discussed with family medicine who will plan to admit the patient for further management. Pt aware and agreeable. Stable at time of transfer.     Theodosia Quay, MD 07/31/15 4081  Leonard Schwartz, MD 07/31/15 2312

## 2015-07-30 ENCOUNTER — Other Ambulatory Visit (HOSPITAL_COMMUNITY): Payer: Self-pay

## 2015-07-30 ENCOUNTER — Inpatient Hospital Stay (HOSPITAL_BASED_OUTPATIENT_CLINIC_OR_DEPARTMENT_OTHER): Payer: Medicaid Other

## 2015-07-30 DIAGNOSIS — T420X1A Poisoning by hydantoin derivatives, accidental (unintentional), initial encounter: Secondary | ICD-10-CM | POA: Diagnosis not present

## 2015-07-30 DIAGNOSIS — G459 Transient cerebral ischemic attack, unspecified: Secondary | ICD-10-CM | POA: Diagnosis not present

## 2015-07-30 DIAGNOSIS — R42 Dizziness and giddiness: Secondary | ICD-10-CM | POA: Diagnosis not present

## 2015-07-30 DIAGNOSIS — R479 Unspecified speech disturbances: Secondary | ICD-10-CM | POA: Diagnosis not present

## 2015-07-30 DIAGNOSIS — G40909 Epilepsy, unspecified, not intractable, without status epilepticus: Secondary | ICD-10-CM | POA: Diagnosis not present

## 2015-07-30 DIAGNOSIS — F251 Schizoaffective disorder, depressive type: Secondary | ICD-10-CM | POA: Diagnosis not present

## 2015-07-30 LAB — BASIC METABOLIC PANEL
Anion gap: 12 (ref 5–15)
BUN: 8 mg/dL (ref 6–20)
CO2: 22 mmol/L (ref 22–32)
Calcium: 8.3 mg/dL — ABNORMAL LOW (ref 8.9–10.3)
Chloride: 103 mmol/L (ref 101–111)
Creatinine, Ser: 0.91 mg/dL (ref 0.61–1.24)
GFR calc Af Amer: >60 mL/min (ref >60)
GFR calc non Af Amer: >60 mL/min (ref >60)
Glucose, Bld: 96 mg/dL (ref 65–99)
Potassium: 4.4 mmol/L (ref 3.5–5.1)
Sodium: 137 mmol/L (ref 135–145)

## 2015-07-30 LAB — LIPID PANEL
Cholesterol: 125 mg/dL (ref 0–200)
HDL: 42 mg/dL (ref >40)
LDL Cholesterol: 62 mg/dL (ref 0–99)
Total CHOL/HDL Ratio: 3 RATIO
Triglycerides: 107 mg/dL (ref <150)
VLDL: 21 mg/dL (ref 0–40)

## 2015-07-30 LAB — PHENYTOIN LEVEL, TOTAL: Phenytoin Lvl: 32.2 ug/mL (ref 10.0–20.0)

## 2015-07-30 MED ORDER — POLYETHYLENE GLYCOL 3350 17 G PO PACK
17.0000 g | PACK | Freq: Every day | ORAL | Status: DC
  Administered 2015-07-31 – 2015-08-02 (×4): 17 g via ORAL
  Filled 2015-07-30 (×3): qty 1

## 2015-07-30 NOTE — Progress Notes (Signed)
I was called by the nurses after rounding. The patient apparently had a spell of unresponsiveness. He was noted to be staring and tracking but nonverbal. He was awake and alert. No movement disorders. No shaking for tight activities.The event lasted for about 5-10 minutes. He is now back to baseline. An EEG will be obtained. The case is discussed with the teaching service. Stressed the importance of minimizing psychotropic medications and checking levels including Elavil, Dilantin and vimpat. The current event is either a psychosomatic event or possibly complex partial seizures.

## 2015-07-30 NOTE — Progress Notes (Signed)
[name] was reported to have a spell of unresponsiveness with staring that lasted 5-10 minutes this evening. Went to patient bedside, and [name] stated he just wasn't talking. He states he was overwhelmed and didn't want to talk but knew what was going on. He says he knew there were three people in the room. He was AOx3.  Neurology expressed concern about polypharmacy. They noted nystagmus and hand flapping, which was also present for this examiner. Elavil and amitriptyline levels were ordered. EEG ordered given patient's history of seizures.  Patient expressed interest in getting referred to a psychiatrist.  He states he is a fast metabolizer of elavil and should be receiving 450 mg. Explained that team needed to get levels of his medications.

## 2015-07-30 NOTE — Progress Notes (Signed)
STROKE TEAM PROGRESS NOTE   HISTORY LISTER BRIZZI is a 59 y.o. male with a history of hypertension, hyperlipidemia, seizure disorder, schizoaffective disorder and anxiety, brought to the emergency room and code stroke status following acute onset of slurred speech and confusion at 1440 today. He has been experiencing vertigo for 5 days. He was seen in emergency room here on 07/25/2015 and underwent an MRI of his brain which was unremarkable. Claritin and meclizine. No focal weakness was noted. Patient has no history of stroke nor TIA. He has not been on antiplatelets therapy. CT scan of his head showed no acute intracranial abnormality. NIH stroke score was 3.  LSN: [id number] on 07/29/2015 tPA Given: No: Minimal deficits mRankin:   SUBJECTIVE (INTERVAL HISTORY) The patient reports that he is not doing well. He tells me that he has had significant adjustments in his medication that they really helped. He tells me his medications have been reduced. He tells me all of his psychotropics have been reduced particularly the Elavil. He was started on Vimpat a couple weeks ago because of patient having 6 seizures within a short time. Over about 2 months. He has a long-standing history of seizures and has been on Dilantin for about 30 years.   OBJECTIVE Temp:  [97.7 F (36.5 C)-99.5 F (37.5 C)] 98.4 F (36.9 C) (08/13 1339) Pulse Rate:  [71-84] 76 (08/13 1339) Cardiac Rhythm:  [-] Normal sinus rhythm (08/13 0800) Resp:  [14-21] 16 (08/13 1339) BP: (107-123)/(59-84) 108/67 mmHg (08/13 1339) SpO2:  [93 %-99 %] 93 % (08/13 1339)  No results for input(s): GLUCAP in the last 168 hours.  Recent Labs Lab 07/25/15 2143 07/25/15 2208 07/29/15 1526 07/29/15 1538 07/30/15 1449  NA 136 138 138 136 137  K 4.0 4.0 4.2 5.3* 4.4  CL 104 102 105 103 103  CO2 22  --  24  --  22  GLUCOSE 114* 107* 133* 128* 96  BUN 8 10 8 12 8   CREATININE 0.85 0.80 1.06 0.90 0.91  CALCIUM 8.9  --  8.7*  --  8.3*    Recent  Labs Lab 07/25/15 2143 07/29/15 1526  AST 18 25  ALT 14* 16*  ALKPHOS 85 82  BILITOT 0.2* 0.7  PROT 6.5 6.4*  ALBUMIN 3.9 3.8    Recent Labs Lab 07/25/15 2208 07/25/15 2300 07/29/15 1526 07/29/15 1538  WBC  --  7.4 7.4  --   NEUTROABS  --  5.2 5.4  --   HGB 15.6 15.7 15.3 16.0  HCT 46.0 44.7 43.8 47.0  MCV  --  91.4 92.0  --   PLT  --  223 195  --    No results for input(s): CKTOTAL, CKMB, CKMBINDEX, TROPONINI in the last 168 hours.  Recent Labs  07/29/15 1526  LABPROT 14.0  INR 1.06    Recent Labs  07/29/15 1708  COLORURINE YELLOW  LABSPEC 1.009  PHURINE 7.0  GLUCOSEU NEGATIVE  HGBUR NEGATIVE  BILIRUBINUR NEGATIVE  KETONESUR NEGATIVE  PROTEINUR NEGATIVE  UROBILINOGEN 0.2  NITRITE NEGATIVE  LEUKOCYTESUR NEGATIVE       Component Value Date/Time   CHOL 125 07/30/2015 0550   TRIG 107 07/30/2015 0550   HDL 42 07/30/2015 0550   CHOLHDL 3.0 07/30/2015 0550   VLDL 21 07/30/2015 0550   LDLCALC 62 07/30/2015 0550   Lab Results  Component Value Date   HGBA1C 5.9* 07/13/2015      Component Value Date/Time   LABOPIA NONE DETECTED 07/29/2015 1708  COCAINSCRNUR NONE DETECTED 07/29/2015 1708   LABBENZ POSITIVE* 07/29/2015 1708   AMPHETMU NONE DETECTED 07/29/2015 1708   THCU NONE DETECTED 07/29/2015 1708   LABBARB NONE DETECTED 07/29/2015 1708     Recent Labs Lab 07/29/15 1524  ETH <5     IMAGING  Ct Head Wo Contrast 07/29/2015    No acute intracranial findings.  No change since prior examination.    .     Mr Jodene Nam Head/brain Wo Cm 07/29/2015    1. Unchanged appearance of the brain without evidence of acute intracranial abnormality.  2. No major intracranial arterial occlusion or significant stenosis.      PHYSICAL EXAM  GENERAL: No acute distress.  HEENT: Normal.  ABDOMEN: soft  EXTREMITIES: No edema. Flapping noted of the hands bilaterally.   BACK:Normal.  SKIN: Normal by inspection.    MENTAL STATUS: Alert and oriented. The  patient is lucid and coherent. He seems to have a mild to moderate dysarthria.  CRANIAL NERVES: Pupils are equal, round and reactive to light and accomodation; extra ocular movements are full, there is Marked sustained gaze evoked mixed nystagmus Especially end gaze bilaterally; visual fields are full; upper and lower facial muscles are normal in strength and symmetric, there is no flattening of the nasolabial folds; tongue is midline; uvula is midline; shoulder elevation is normal.  MOTOR: Normal tone, bulk and strength; no pronator drift.  COORDINATION: Left finger to nose is normal, right finger to nose is normal, No rest tremor; no intention tremor; no postural tremor; no bradykinesia.  SENSATION: Normal to light touch.       ASSESSMENT/PLAN Mr. MIKAI MEINTS is a 59 y.o. male with history of hypertension, seizures, dyslipidemia, schizoaffective disorder, anxiety, depression, MRSA, and memory loss presenting with slurred speech, confusion, and vertigo. He did not receive IV t-PA due to minimal deficits.  [facility] POLYPHARMACY.  Recommended Dilantin, Elavil/Tricyclics and Vimpat levels. Reduced and/or minimize psychoactive medications including amitriptyline which she is on a high-dose, clonazepam and Seroquel.   MRI  no acute abnormality  MRA  no significant occlusion or stenosis  Carotid Doppler  pending  2D Echo  pending  LDL 62  HgbA1c pending  Lovenox for VTE prophylaxis Diet regular Room service appropriate?: Yes; Fluid consistency:: Thin  aspirin 81 mg orally every day prior to admission, now on aspirin 81 mg orally every day  Patient counseled to be compliant with his antithrombotic medications  Ongoing aggressive stroke risk factor management  Therapy recommendations:  Pending - patient on bedrest  Disposition:  Pending  Hypertension  Home meds: Metoprolol  Blood pressure tends to run low - currently off antihypertensives  medications   Hyperlipidemia  Home meds: Zocor 40 mg daily - now on Lipitor 40 mg daily  LDL 62, goal < 70  Continue statin at discharge   Diabetes  HgbA1c pending, goal < 7.0  Controlled  Other Stroke Risk Factors  Advanced age  Obesity, There is no weight on file to calculate BMI.   Family hx stroke (both parents)   Other Active Problems    Other Pertinent History  Seizure disorder - on Vimpat and Dilantin ( consider rechecking a Dilantin level ) on 07/13/2015 Phenytoin was 20.4 (H) free Phenytoin was 1.3 ( 1.0 - 2.0 )  Hospital day # 1  Mikey Bussing PA-C [name] Pager [phone] 07/30/2015, 5:08 PM     To contact Stroke Continuity provider, please refer to [email or web address] After hours, contact General Neurology

## 2015-07-30 NOTE — Progress Notes (Signed)
  Echocardiogram 2D Echocardiogram has been performed.  [name] 07/30/2015, 5:11 PM

## 2015-07-30 NOTE — Progress Notes (Signed)
PT Cancellation Note  Patient Details Name: [name] MRN: [id number] DOB: 1956-11-21   Cancelled Treatment:    Reason Eval/Treat Not Completed: Medical issues which prohibited therapy.  Patient remains on bedrest per orders.  MD:  Please write activity orders when appropriate for patient.  PT will initiate evaluation at that time.  Thank you.   Despina Pole 07/30/2015, 3:52 PM Carita Pian. Sanjuana Kava, [place] Pager [phone]

## 2015-07-30 NOTE — Progress Notes (Signed)
CRITICAL VALUE ALERT  Critical value received:  Dilantin level 32.2  Date of notification:  07/30/2015  Time of notification:  21;35  Critical value read back:yes  Nurse who received alert:  Imagene Sheller RN  MD notified (1st page):  Dr Hinton Rao  Time of first page:  21:38  MD notified (2nd page):  Time of second page:  Responding MD:  Dr Hinton Rao  Time MD responded:  21:40

## 2015-07-30 NOTE — Progress Notes (Signed)
Family Medicine Teaching Service Daily Progress Note Intern Pager: [phone]  Patient name: [name] Medical record number: [id number] Date of birth: [birth year] Age: 59 y.o. Gender: male  Primary Care Provider: Chrisandra Netters, MD Consultants: Neurology Code Status: DNR  Pt Overview and Major Events to Date:  - 07/29/15 CT head negative  - 07/29/15 MRA/MRI without arterial occlusion or significant stenosis   Assessment and Plan: [name] is a 59 y.o. male presenting with slurred speech suspicious for TIA. PMH is significant for seizure disorder, severe schizoaffective disorder, depression, anxiety, HTN, HLD. Continue stroke work-up.   # Slurred speech/TIA: Improving.  - Neurology consulted and following, appreciate recommendations.  - daily ASA - telemetry  - echo, carotid duplex   - risk strat labs: lipid panel WNL, a1c pending - PT/OT/SLP evals   # Hypertension: currently normotensive - Continue to hold home toprol.   # Vertigo:  - PT vestibular eval  - meclizine 25mg  TID PRN   # Seizure disorder: do not feel current episode is seizure related.  - continue phenytoin 300mg  daily  - continue lacosamide 150mg  BID   # Schizoaffective disorder/depression/anxiety: [facility] end of July, has appt for f/u scheduled on 8/15. No current SI.  - continue seroquel 600mg  qhs  - continue amitriptyline 300mg  qhs  - klonopin 2mg  TID PRN   # HLD:  lipid panel WNL - Continue atorvastatin   # Constipation: has not had BM in 5 days - Miralax daily  FEN/GI: reg diet after passing swallow screen / saline lock  Prophylaxis: lovenox sq  Disposition: Family Medicine Teaching Service to complete stroke work-up.   Subjective:  Patient struggling to find his words. He does not slur his words when he speaks slowly and very intentionally. Begins slurring when speaking at normal rate. He complains of some spinning of the room, which he says is consistent  with his known vertigo.   Objective: Temp:  [97.7 F (36.5 C)-99.5 F (37.5 C)] 98.4 F (36.9 C) (08/13 1339) Pulse Rate:  [70-84] 76 (08/13 1339) Resp:  [13-38] 16 (08/13 1339) BP: (107-127)/(59-84) 108/67 mmHg (08/13 1339) SpO2:  [93 %-99 %] 93 % (08/13 1339) Physical Exam: General: Resting in bed, NAD  Eyes: PERRL, EOMI Cardiovascular: RRR, no murmurs, rubs or gallops. 2+ DP pulses.  Respiratory: CTAB Abdomen: soft, non-tender, non-distended, +BS Neuro: AAOx4. 5/5 strength in all extremities. Cranial Nerves II-XII intact, impaired coordination on finger-nose-finger test (shaky)  Laboratory:  Recent Labs Lab 07/25/15 2300 07/29/15 1526 07/29/15 1538  WBC 7.4 7.4  --   HGB 15.7 15.3 16.0  HCT 44.7 43.8 47.0  PLT 223 195  --     Recent Labs Lab 07/25/15 2143 07/25/15 2208 07/29/15 1526 07/29/15 1538  NA 136 138 138 136  K 4.0 4.0 4.2 5.3*  CL 104 102 105 103  CO2 22  --  24  --   BUN 8 10 8 12   CREATININE 0.85 0.80 1.06 0.90  CALCIUM 8.9  --  8.7*  --   PROT 6.5  --  6.4*  --   BILITOT 0.2*  --  0.7  --   ALKPHOS 85  --  82  --   ALT 14*  --  16*  --   AST 18  --  25  --   GLUCOSE 114* 107* 133* 128*    Imaging/Diagnostic Tests: Ct Head Wo Contrast  07/29/2015   CLINICAL DATA:  Code stroke.  Sudden onset of slurred speech.  EXAM: CT HEAD WITHOUT CONTRAST  TECHNIQUE: Contiguous axial images were obtained from the base of the skull through the vertex without intravenous contrast.  COMPARISON:  MRI brain 07/25/2015 and CT scan 05/21/2014  FINDINGS: The ventricles are normal in size and configuration. No extra-axial fluid collections are identified. The gray-white differentiation is normal. No CT findings for acute intracranial process such as hemorrhage or infarction. No mass lesions. The brainstem and cerebellum are grossly normal.  The bony structures are intact. The paranasal sinuses and mastoid air cells are clear. The globes are intact.  IMPRESSION: No acute  intracranial findings.  No change since prior examination.   Electronically Signed   By: Marijo Sanes M.D.   On: 07/29/2015 15:28   Ct Head Wo Contrast  07/25/2015   CLINICAL DATA:  Acute onset of dizziness, nausea and vertigo. Initial encounter.  EXAM: CT HEAD WITHOUT CONTRAST  TECHNIQUE: Contiguous axial images were obtained from the base of the skull through the vertex without intravenous contrast.  COMPARISON:  CT of the head performed 06/01/2015  FINDINGS: There is no evidence of acute infarction, mass lesion, or intra- or extra-axial hemorrhage on CT.  Prominence of the sulci suggest mild cortical volume loss. Mild cerebellar atrophy is noted. Periventricular white matter change likely reflects small vessel ischemic microangiopathy.  The brainstem and fourth ventricle are within normal limits. The basal ganglia are unremarkable in appearance. The cerebral hemispheres demonstrate grossly normal gray-white differentiation. No mass effect or midline shift is seen.  There is no evidence of fracture; visualized osseous structures are unremarkable in appearance. The orbits are within normal limits. The paranasal sinuses and mastoid air cells are well-aerated. No significant soft tissue abnormalities are seen.  IMPRESSION: 1. No acute intracranial pathology seen on CT. 2. Mild cortical volume loss and scattered small vessel ischemic microangiopathy.   Electronically Signed   By: Garald Balding M.D.   On: 07/25/2015 22:20   Mr Brain Wo Contrast  07/29/2015   CLINICAL DATA:  TIA. Acute onset slurred speech and confusion earlier today. Vertigo for 5 days. History of seizure disorder.  EXAM: MRI HEAD WITHOUT CONTRAST  MRA HEAD WITHOUT CONTRAST  TECHNIQUE: Multiplanar, multiecho pulse sequences of the brain and surrounding structures were obtained without intravenous contrast. Angiographic images of the head were obtained using MRA technique without contrast.  COMPARISON:  Head CT 07/29/2015.  Head MRI 07/25/2015.   FINDINGS: MRI HEAD FINDINGS  Dedicated thin section imaging through the temporal lobes demonstrates normal volume and signal of the hippocampi. There is no evidence of acute infarct, intracranial hemorrhage, mass, midline shift, or extra-axial fluid collection. There is mild generalized cerebral atrophy. Scattered, small foci of T2 hyperintensity in the cerebral white matter are unchanged from the recent prior MRI and nonspecific but compatible with minimal chronic small vessel ischemic disease.  Orbits are unremarkable. Paranasal sinuses and mastoid air cells are clear. Major intracranial vascular flow voids are preserved.  MRA HEAD FINDINGS  The visualized distal vertebral arteries are patent with the right being dominant. Right PICA origin is patent. Left AICA is patent and dominant. SCA origins are patent, although the left SCA is small and not well evaluated. Basilar artery is patent without stenosis. There are fetal type origins of both PCAs. No significant PCA stenosis is seen.  Internal carotid arteries are patent from skullbase to carotid termini. ACAs and MCAs are unremarkable. No intracranial aneurysm is identified.  IMPRESSION: 1. Unchanged appearance of the brain  without evidence of acute intracranial abnormality. 2. No major intracranial arterial occlusion or significant stenosis.   Electronically Signed   By: Logan Bores   On: 07/29/2015 21:40   Mr Brain Wo Contrast  07/26/2015   CLINICAL DATA:  Initial evaluation for acute nausea, dizziness for several days.  EXAM: MRI HEAD WITHOUT CONTRAST  TECHNIQUE: Multiplanar, multiecho pulse sequences of the brain and surrounding structures were obtained without intravenous contrast.  COMPARISON:  Prior CT from earlier the same day.  FINDINGS: Mild diffuse prominence of the CSF containing spaces is compatible with generalized age-related cerebral atrophy. Mild T2/FLAIR hyperintense foci within the periventricular white matter, nonspecific, but most likely  related to very minimal chronic small vessel ischemic disease.  No abnormal foci of restricted diffusion to suggest acute intracranial infarct. Gray-white matter differentiation maintained. Normal intravascular flow voids are preserved. No acute or chronic intracranial hemorrhage.  No mass lesion, midline shift, or mass effect. No hydrocephalus. No extra-axial fluid collection.  Craniocervical junction within normal limits. Pituitary gland normal.  No acute abnormality about the orbits. Mild opacity present within the right ethmoidal air cells. Paranasal sinuses are otherwise largely clear. No mastoid effusion. Inner ear structures within normal limits.  Bone marrow signal intensity normal. Scalp soft tissues unremarkable.  IMPRESSION: 1. No acute intracranial infarct or other abnormality identified. 2. Mild age-related cerebral atrophy with chronic microvascular ischemic disease.   Electronically Signed   By: Jeannine Boga M.D.   On: 07/26/2015 01:24   Mr Jodene Nam Head/brain Wo Cm  07/29/2015   CLINICAL DATA:  TIA. Acute onset slurred speech and confusion earlier today. Vertigo for 5 days. History of seizure disorder.  EXAM: MRI HEAD WITHOUT CONTRAST  MRA HEAD WITHOUT CONTRAST  TECHNIQUE: Multiplanar, multiecho pulse sequences of the brain and surrounding structures were obtained without intravenous contrast. Angiographic images of the head were obtained using MRA technique without contrast.  COMPARISON:  Head CT 07/29/2015.  Head MRI 07/25/2015.  FINDINGS: MRI HEAD FINDINGS  Dedicated thin section imaging through the temporal lobes demonstrates normal volume and signal of the hippocampi. There is no evidence of acute infarct, intracranial hemorrhage, mass, midline shift, or extra-axial fluid collection. There is mild generalized cerebral atrophy. Scattered, small foci of T2 hyperintensity in the cerebral white matter are unchanged from the recent prior MRI and nonspecific but compatible with minimal chronic  small vessel ischemic disease.  Orbits are unremarkable. Paranasal sinuses and mastoid air cells are clear. Major intracranial vascular flow voids are preserved.  MRA HEAD FINDINGS  The visualized distal vertebral arteries are patent with the right being dominant. Right PICA origin is patent. Left AICA is patent and dominant. SCA origins are patent, although the left SCA is small and not well evaluated. Basilar artery is patent without stenosis. There are fetal type origins of both PCAs. No significant PCA stenosis is seen.  Internal carotid arteries are patent from skullbase to carotid termini. ACAs and MCAs are unremarkable. No intracranial aneurysm is identified.  IMPRESSION: 1. Unchanged appearance of the brain without evidence of acute intracranial abnormality. 2. No major intracranial arterial occlusion or significant stenosis.   Electronically Signed   By: Logan Bores   On: 07/29/2015 21:40     Recardo Linn Corinda Gubler, MD 07/30/2015, 2:57 PM PGY-1, [facility] Intern pager: [phone], text pages welcome

## 2015-07-31 ENCOUNTER — Encounter (HOSPITAL_COMMUNITY): Payer: Self-pay

## 2015-07-31 ENCOUNTER — Inpatient Hospital Stay (HOSPITAL_COMMUNITY): Payer: Medicaid Other

## 2015-07-31 DIAGNOSIS — G40909 Epilepsy, unspecified, not intractable, without status epilepticus: Secondary | ICD-10-CM | POA: Diagnosis not present

## 2015-07-31 DIAGNOSIS — F251 Schizoaffective disorder, depressive type: Secondary | ICD-10-CM | POA: Diagnosis not present

## 2015-07-31 DIAGNOSIS — T420X1A Poisoning by hydantoin derivatives, accidental (unintentional), initial encounter: Secondary | ICD-10-CM | POA: Insufficient documentation

## 2015-07-31 DIAGNOSIS — R42 Dizziness and giddiness: Secondary | ICD-10-CM | POA: Diagnosis not present

## 2015-07-31 DIAGNOSIS — R479 Unspecified speech disturbances: Secondary | ICD-10-CM | POA: Diagnosis not present

## 2015-07-31 DIAGNOSIS — T420X1D Poisoning by hydantoin derivatives, accidental (unintentional), subsequent encounter: Secondary | ICD-10-CM

## 2015-07-31 LAB — GLUCOSE, CAPILLARY: Glucose-Capillary: 133 mg/dL — ABNORMAL HIGH (ref 65–99)

## 2015-07-31 NOTE — Progress Notes (Signed)
Family Medicine Teaching Service Daily Progress Note Intern Pager: [phone]  Patient name: [name] Medical record number: [id number] Date of birth: Sep 03, 1956 Age: 59 y.o. Gender: male  Primary Care Provider: Chrisandra Netters, MD Consultants: Neurology Code Status: DNR  Pt Overview and Major Events to Date:  - 07/29/15 CT head negative  - 07/29/15 MRA/MRI without arterial occlusion or significant stenosis - 8/13 - Symptoms likely polypharmacy. Toxic Phenytoin level @ 32.2 (holding until down to 20) - 8/14 - Pending Amitriptyline Level, (unable to obtain lacosamide level). Neurology signed off  Assessment and Plan: [name] is a 59 y.o. male presenting with slurred speech suspicious for TIA. PMH is significant for seizure disorder, severe schizoaffective disorder, depression, anxiety, HTN, HLD. Continue stroke work-up.   # Slurred speech, likely in setting of polypharmacy with toxic Phenytoin level - Improving Complex PMH with neuro/psych disorders including seizure disorder, schizoaffective disorder, on multiple AEDs and anti-psychotic meds at baseline, all of which can be sedating and cause complications with polypharmacy. Phenytoin level elevated in toxic range at 32.2 (albumin corrected to 37.4), as likely etiology. Otherwise, work-up for TIA/CVA negative with MRI and MRA unremarkable for acute changes, with known chronic changes. - Neurology consulted, suspect toxic phenytoin is etiology of symptoms. Signed off today. Follow-up outpt. - Hold phenytoin today 8/14 (last dose 8/13), normally 300mg -ER daily - Re-check phenytoin total level Monday 8/15, goal < 20 therapeutic, then would consult pharmacy for dosing adjustment on discharge - Continue ASA - ECHO - normal EF 60-65%, no evidence of thrombus - Pending Carotid duplex - EEG ordered previously on admit, will obtain on Monday per Neuro - PT/OT  # Hypertension, controlled - Continue to hold home Metoprolol  # Vertigo,  improved - PT vestibular eval  - meclizine 25mg  TID PRN   # Seizure disorder, controlled No evidence of active / breakthrough seizure. - Hold phenytoin 300mg  daily (today 8/14) with toxic level 32, re-check in AM 8/15 - continue lacosamide 150mg  BID   # Schizoaffective disorder/depression/anxiety: was recently at [facility] for SI end of July, has appt for f/u scheduled on 8/15. No current SI.  - continue seroquel 600mg  qhs  - continue amitriptyline 300mg  qhs (following up amitriptyline level) - klonopin 2mg  TID PRN   # HLD:  lipid panel WNL - Continue atorvastatin   # Constipation: has not had BM in 5 days - Miralax daily  FEN/GI: reg diet, SLIV Prophylaxis: lovenox sq  Disposition: Admitted for work-up possible CVA, since negative work-up with MRI / MRA, Neurology signed off, after likely etiology is polypharmacy and toxic phenytoin level, holding now and re-check drug levels 8/15. Possible discharge in 1-2 days, pending PT/OT  Subjective:  Improved today per patient and nursing report. He no longer feels like he is slurring speech, and has better ability to find words and communicate. Improved vertigo and dizziness. Has not ambulated yet, due to fall risk, awaiting PT/OT today. Tolerating PO well. Understands holding phenytoin now, re-check levels.  Objective: Temp:  [97.8 F (36.6 C)-98.4 F (36.9 C)] 98 F (36.7 C) (08/14 0621) Pulse Rate:  [76-84] 81 (08/14 0621) Resp:  [16-20] 20 (08/14 0621) BP: (100-128)/(59-76) 118/62 mmHg (08/14 0621) SpO2:  [93 %-97 %] 97 % (08/14 0621) Weight:  [186 lb 9.6 oz (84.641 kg)] 186 lb 9.6 oz (84.641 kg) (08/14 0600) Physical Exam: General: Resting in bed, comfortable, NAD  Eyes: PERRL, EOMI Cardiovascular: RRR, no murmurs, rubs or gallops. Respiratory: CTAB, good air movement Abdomen: soft,  NTND, +BS Neuro: AAOx4. 5/5 strength in all extremities. CN II-XII intact. Resolved slurred speech. Improved finger to nose  test  Laboratory:  Recent Labs Lab 07/25/15 2300 07/29/15 1526 07/29/15 1538  WBC 7.4 7.4  --   HGB 15.7 15.3 16.0  HCT 44.7 43.8 47.0  PLT 223 195  --     Recent Labs Lab 07/25/15 2143  07/29/15 1526 07/29/15 1538 07/30/15 1449  NA 136  < > 138 136 137  K 4.0  < > 4.2 5.3* 4.4  CL 104  < > 105 103 103  CO2 22  --  24  --  22  BUN 8  < > 8 12 8   CREATININE 0.85  < > 1.06 0.90 0.91  CALCIUM 8.9  --  8.7*  --  8.3*  PROT 6.5  --  6.4*  --   --   BILITOT 0.2*  --  0.7  --   --   ALKPHOS 85  --  82  --   --   ALT 14*  --  16*  --   --   AST 18  --  25  --   --   GLUCOSE 114*  < > 133* 128* 96  < > = values in this interval not displayed.  Imaging/Diagnostic Tests: Ct Head Wo Contrast  07/29/2015   CLINICAL DATA:  Code stroke.  Sudden onset of slurred speech.  EXAM: CT HEAD WITHOUT CONTRAST  TECHNIQUE: Contiguous axial images were obtained from the base of the skull through the vertex without intravenous contrast.  COMPARISON:  MRI brain 07/25/2015 and CT scan 05/21/2014  FINDINGS: The ventricles are normal in size and configuration. No extra-axial fluid collections are identified. The gray-white differentiation is normal. No CT findings for acute intracranial process such as hemorrhage or infarction. No mass lesions. The brainstem and cerebellum are grossly normal.  The bony structures are intact. The paranasal sinuses and mastoid air cells are clear. The globes are intact.  IMPRESSION: No acute intracranial findings.  No change since prior examination.   Electronically Signed   By: Marijo Sanes M.D.   On: 07/29/2015 15:28   Ct Head Wo Contrast  07/25/2015   CLINICAL DATA:  Acute onset of dizziness, nausea and vertigo. Initial encounter.  EXAM: CT HEAD WITHOUT CONTRAST  TECHNIQUE: Contiguous axial images were obtained from the base of the skull through the vertex without intravenous contrast.  COMPARISON:  CT of the head performed 06/01/2015  FINDINGS: There is no evidence of  acute infarction, mass lesion, or intra- or extra-axial hemorrhage on CT.  Prominence of the sulci suggest mild cortical volume loss. Mild cerebellar atrophy is noted. Periventricular white matter change likely reflects small vessel ischemic microangiopathy.  The brainstem and fourth ventricle are within normal limits. The basal ganglia are unremarkable in appearance. The cerebral hemispheres demonstrate grossly normal gray-white differentiation. No mass effect or midline shift is seen.  There is no evidence of fracture; visualized osseous structures are unremarkable in appearance. The orbits are within normal limits. The paranasal sinuses and mastoid air cells are well-aerated. No significant soft tissue abnormalities are seen.  IMPRESSION: 1. No acute intracranial pathology seen on CT. 2. Mild cortical volume loss and scattered small vessel ischemic microangiopathy.   Electronically Signed   By: Garald Balding M.D.   On: 07/25/2015 22:20   Mr Brain Wo Contrast  07/29/2015   CLINICAL DATA:  TIA. Acute onset slurred speech and confusion earlier today. Vertigo for  5 days. History of seizure disorder.  EXAM: MRI HEAD WITHOUT CONTRAST  MRA HEAD WITHOUT CONTRAST  TECHNIQUE: Multiplanar, multiecho pulse sequences of the brain and surrounding structures were obtained without intravenous contrast. Angiographic images of the head were obtained using MRA technique without contrast.  COMPARISON:  Head CT 07/29/2015.  Head MRI 07/25/2015.  FINDINGS: MRI HEAD FINDINGS  Dedicated thin section imaging through the temporal lobes demonstrates normal volume and signal of the hippocampi. There is no evidence of acute infarct, intracranial hemorrhage, mass, midline shift, or extra-axial fluid collection. There is mild generalized cerebral atrophy. Scattered, small foci of T2 hyperintensity in the cerebral white matter are unchanged from the recent prior MRI and nonspecific but compatible with minimal chronic small vessel ischemic  disease.  Orbits are unremarkable. Paranasal sinuses and mastoid air cells are clear. Major intracranial vascular flow voids are preserved.  MRA HEAD FINDINGS  The visualized distal vertebral arteries are patent with the right being dominant. Right PICA origin is patent. Left AICA is patent and dominant. SCA origins are patent, although the left SCA is small and not well evaluated. Basilar artery is patent without stenosis. There are fetal type origins of both PCAs. No significant PCA stenosis is seen.  Internal carotid arteries are patent from skullbase to carotid termini. ACAs and MCAs are unremarkable. No intracranial aneurysm is identified.  IMPRESSION: 1. Unchanged appearance of the brain without evidence of acute intracranial abnormality. 2. No major intracranial arterial occlusion or significant stenosis.   Electronically Signed   By: Logan Bores   On: 07/29/2015 21:40   Mr Brain Wo Contrast  07/26/2015   CLINICAL DATA:  Initial evaluation for acute nausea, dizziness for several days.  EXAM: MRI HEAD WITHOUT CONTRAST  TECHNIQUE: Multiplanar, multiecho pulse sequences of the brain and surrounding structures were obtained without intravenous contrast.  COMPARISON:  Prior CT from earlier the same day.  FINDINGS: Mild diffuse prominence of the CSF containing spaces is compatible with generalized age-related cerebral atrophy. Mild T2/FLAIR hyperintense foci within the periventricular white matter, nonspecific, but most likely related to very minimal chronic small vessel ischemic disease.  No abnormal foci of restricted diffusion to suggest acute intracranial infarct. Gray-white matter differentiation maintained. Normal intravascular flow voids are preserved. No acute or chronic intracranial hemorrhage.  No mass lesion, midline shift, or mass effect. No hydrocephalus. No extra-axial fluid collection.  Craniocervical junction within normal limits. Pituitary gland normal.  No acute abnormality about the orbits.  Mild opacity present within the right ethmoidal air cells. Paranasal sinuses are otherwise largely clear. No mastoid effusion. Inner ear structures within normal limits.  Bone marrow signal intensity normal. Scalp soft tissues unremarkable.  IMPRESSION: 1. No acute intracranial infarct or other abnormality identified. 2. Mild age-related cerebral atrophy with chronic microvascular ischemic disease.   Electronically Signed   By: Jeannine Boga M.D.   On: 07/26/2015 01:24   Mr Jodene Nam Head/brain Wo Cm  07/29/2015   CLINICAL DATA:  TIA. Acute onset slurred speech and confusion earlier today. Vertigo for 5 days. History of seizure disorder.  EXAM: MRI HEAD WITHOUT CONTRAST  MRA HEAD WITHOUT CONTRAST  TECHNIQUE: Multiplanar, multiecho pulse sequences of the brain and surrounding structures were obtained without intravenous contrast. Angiographic images of the head were obtained using MRA technique without contrast.  COMPARISON:  Head CT 07/29/2015.  Head MRI 07/25/2015.  FINDINGS: MRI HEAD FINDINGS  Dedicated thin section imaging through the temporal lobes demonstrates normal volume and signal of the hippocampi. There is  no evidence of acute infarct, intracranial hemorrhage, mass, midline shift, or extra-axial fluid collection. There is mild generalized cerebral atrophy. Scattered, small foci of T2 hyperintensity in the cerebral white matter are unchanged from the recent prior MRI and nonspecific but compatible with minimal chronic small vessel ischemic disease.  Orbits are unremarkable. Paranasal sinuses and mastoid air cells are clear. Major intracranial vascular flow voids are preserved.  MRA HEAD FINDINGS  The visualized distal vertebral arteries are patent with the right being dominant. Right PICA origin is patent. Left AICA is patent and dominant. SCA origins are patent, although the left SCA is small and not well evaluated. Basilar artery is patent without stenosis. There are fetal type origins of both PCAs.  No significant PCA stenosis is seen.  Internal carotid arteries are patent from skullbase to carotid termini. ACAs and MCAs are unremarkable. No intracranial aneurysm is identified.  IMPRESSION: 1. Unchanged appearance of the brain without evidence of acute intracranial abnormality. 2. No major intracranial arterial occlusion or significant stenosis.   Electronically Signed   By: Logan Bores   On: 07/29/2015 21:40     Olin Hauser, DO 07/31/2015, 8:52 AM PGY-3, [facility] Intern pager: [phone], text pages welcome

## 2015-07-31 NOTE — Progress Notes (Signed)
Physical Therapy Vestibular Evaluation  See full Physical Therapy Evaluation note for functional status.   07/31/15 0001  Vestibular Assessment  General Observation Signs/symptoms from results below not consistent with peripheral type vestibular disorder.  Symptom Behavior  Type of Dizziness Oscillopsia  Frequency of Dizziness Constant  Duration of Dizziness Constant  Aggravating Factors Supine to sit;Sit to stand  Relieving Factors No known relieving factors  Occulomotor Exam  Occulomotor Alignment Normal  Spontaneous Absent  Gaze-induced Comment (Rt beat with Rt gaze, Lt beat with Lt gaze)  Smooth Pursuits Saccades  Vestibulo-Occular Reflex  VOR 1 Head Only (x 1 viewing) Slow, saccades, loss of target at end range  VOR 2 Head and Object (x 2 viewing) normal  Auditory  Comments Reports, slight decrease in hearing in Lt ear. Began 3 weeks ago, "fullness" type sensation  Positional Testing  Dix-Hallpike Dix-Hallpike Left;Dix-Hallpike Right  Horizontal Canal Testing Horizontal Canal Left;Horizontal Canal Right  Dix-Hallpike Right  Dix-Hallpike Right Duration no nystagmus  Dix-Hallpike Right Symptoms No nystagmus  Dix-Hallpike Left  Dix-Hallpike Left Duration no nystagmus  Dix-Hallpike Left Symptoms No nystagmus  Horizontal Canal Right  Horizontal Canal Right Duration no change in symptoms  Horizontal Canal Right Symptoms Normal  Horizontal Canal Left  Horizontal Canal Left Duration no change in symptoms  Horizontal Canal Left Symptoms Normal  Cognition  Cognition Orientation Level Oriented x 4  Orthostatics  Orthostatics Comment In vitals tab   Elayne Snare, [place]

## 2015-07-31 NOTE — Progress Notes (Signed)
OT Cancellation Note  Patient Details Name: [name] MRN: [id number] DOB: 1955-12-19   Cancelled Treatment:    Current activity order is Bedrest. PT paged MD. Will perform OT eval as activity order updated Thanks, Payton Mccallum D 07/31/2015, 12:47 PM

## 2015-07-31 NOTE — Evaluation (Signed)
Physical Therapy Evaluation Patient Details Name: [name] MRN: [id number] DOB: [birth year] Today's Date: 07/31/2015   History of Present Illness  [name] is a 59 y.o. male presenting with slurred speech suspicious for TIA. PMH is significant for seizure disorder, severe schizoaffective disorder, depression, anxiety, HTN, HLD. Found to have toxic Dilantin level.  Clinical Impression  Pt admitted with the above complications. Pt currently with functional limitations due to the deficits listed below (see PT Problem List). Patient demonstrates moderate ataxia, requiring min assist for balance with transfers and gait. Previously independent without an assistive device. No significant findings with dix-hallpike or horizontal roll tests, making peripheral vestibular deficit unlikely; although this was difficulty to formally perform due to dyskinetic facial movements while assessing. Pt did show horizontal end range nystagmus BIL. Trouble with x1 viewing and saccades with smooth pursuit tracking. Anticipate pt will progress functionally as Dilantin toxicity resolves. Will continue to monitor safety with mobility while admitted.      Follow Up Recommendations  (TBD based on progress.) (Anticipate patient will become more functionally safe as elevated Dilantin levels resolve- otherwise may need ST-SNF due to lack of assist at home.)    Equipment Recommendations  Rolling walker with 5" wheels    Recommendations for Other Services OT consult     Precautions / Restrictions Precautions Precautions: Fall Restrictions Weight Bearing Restrictions: No      Mobility  Bed Mobility Overal bed mobility: Needs Assistance Bed Mobility: Supine to Sit;Sit to Supine     Supine to sit: Supervision Sit to supine: Supervision   General bed mobility comments: Truncal ataxia, with decreased control but able to perform without assist.  Transfers Overall transfer level: Needs assistance Equipment  used: None;Rolling walker (2 wheeled) Transfers: Sit to/from [facility] Sit to Stand: Min assist Stand pivot transfers: Min guard       General transfer comment: Min assist for balance upon standing. Felt slightly dizzy. Min guard for transfer, notably ataxic. Better stability when performed with RW for support.  Ambulation/Gait Ambulation/Gait assistance: Min assist Ambulation Distance (Feet): 75 Feet Assistive device: Rolling walker (2 wheeled) Gait Pattern/deviations: Step-through pattern;Decreased stride length;Ataxic;Scissoring;Drifts right/left;Staggering left;Staggering right;Narrow base of support Gait velocity: variable   General Gait Details: Moderately ataxic, changing speeds quickly, requiring cues for walker control and min assist for balance. VC for wider base of support.  Scissoring at times especially with turns.  Stairs            Wheelchair Mobility    Modified Rankin (Stroke Patients Only) Modified Rankin (Stroke Patients Only) Pre-Morbid Rankin Score: No symptoms Modified Rankin: Moderately severe disability     Balance Overall balance assessment: Needs assistance;History of Falls Sitting-balance support: No upper extremity supported;Feet supported Sitting balance-Leahy Scale: Fair   Postural control: Other (comment) (ataxic) Standing balance support: No upper extremity supported Standing balance-Leahy Scale: Fair           Rhomberg - Eyes Opened: 5 (LOB)                   Pertinent Vitals/Pain Pain Assessment: No/denies pain    Home Living Family/patient expects to be discharged to:: Private residence Living Arrangements: Alone Available Help at Discharge: Other (Comment) ("Might have help") Type of Home: Apartment Home Access: Stairs to enter Entrance Stairs-Rails: None Entrance Stairs-Number of Steps: 3 Home Layout: One level Home Equipment: Cane - single point      Prior Function Level of Independence:  Independent  Comments: Hx of seizures and schizoaffective disorder; cannot drive.     Hand Dominance   Dominant Hand: Left    Extremity/Trunk Assessment   Upper Extremity Assessment: Defer to OT evaluation           Lower Extremity Assessment: RLE deficits/detail;LLE deficits/detail RLE Deficits / Details: ataxic with HKS test, normal strength and light touch sensation LLE Deficits / Details: ataxic with HKS test, normal strength and light touch sensation     Communication   Communication: No difficulties  Cognition Arousal/Alertness: Awake/alert Behavior During Therapy: WFL for tasks assessed/performed Overall Cognitive Status: Within Functional Limits for tasks assessed                      General Comments General comments (skin integrity, edema, etc.): Assessed with dix-hallpike and horizontal roll test. No nystagmus noted with positional head changes, denies increase in symptoms during positioning. Does have a pronounced end range nystagmus (rt beat with right gaze, Lt beat with Lt gaze), difficulty with smooth pursuit visual tracking, and significant difficulty with x1 viewing. Prescribed x1 viewing exercises after practice, to be performed 3x/day increasing speed as tolerated daily. Pt reports oscillopsic type visual symptoms improved "a lot" at end of therapy session although still slightly present. Orthostatic vitals taken: supine BP 131/82 HR 83, sitting BP 106/71 HR 93 (increased dizziness), Standing BP 110/71 HR 92. Also, complains of "fullness" in left ear which began approx 3 weeks ago.    Exercises        Assessment/Plan    PT Assessment Patient needs continued PT services  PT Diagnosis Difficulty walking;Abnormality of gait   PT Problem List Decreased activity tolerance;Decreased balance;Decreased mobility;Decreased coordination;Decreased knowledge of use of DME  PT Treatment Interventions DME instruction;Gait training;Stair  training;Functional mobility training;Therapeutic activities;Therapeutic exercise;Balance training;Patient/family education   PT Goals (Current goals can be found in the Care Plan section) Acute Rehab PT Goals Patient Stated Goal: None stated PT Goal Formulation: With patient Time For Goal Achievement: 08/14/15 Potential to Achieve Goals: Good    Frequency Min 3X/week   Barriers to discharge Decreased caregiver support lives alone, stays with girlfriend 50% of his time.    Co-evaluation               End of Session Equipment Utilized During Treatment: Gait belt Activity Tolerance: Patient tolerated treatment well Patient left: in chair;with call bell/phone within reach;with chair alarm set Nurse Communication: Mobility status         Time: 8768-1157 PT Time Calculation (min) (ACUTE ONLY): 54 min   Charges:   PT Evaluation $Initial PT Evaluation Tier I: 1 Procedure PT Treatments $Gait Training: 8-22 mins $Therapeutic Activity: 8-22 mins $Canalith Rep Proc: 8-22 mins   PT G Codes:        Ellouise Newer 07/31/2015, 5:59 PM Camille Bal [place], [place]

## 2015-07-31 NOTE — Progress Notes (Signed)
LCSW to follow as consulted for behavioral health issues. Reviewed chart and familiar with patient. Patient has outpatient resources already in place for appointment on Monday. Can call on Monday if still in hospital and obtain another appointment for patient if he misses this current appointment. Awaiting PT recommendations as patient is on bed rest.  Will follow acutely and if SW needs arise, please re-consult. Will have unit CSW to follow up and schedule new appointment if remaining in hospital.  Follow up:  Follow up with [facility] On 08/01/2015.    Why: Monday at 1:00   Contact information:   [address] [place] [[id number]] [place] LCSW, MSW Clinical Social Work: Emergency Room [phone]

## 2015-07-31 NOTE — Progress Notes (Signed)
Phenytoin level is toxic at 32.2. Corrected level for albumin is 37.4. This would most likely explain the patient's deficits. MRI and MRA negative. Amitriptyline level is pending. Would suggest holding Phenytoin until level is 20 then ask pharmacy to assist with dosing.  The stroke team will sign off. Please call for further assistance or questions. Thank you.  Mikey Bussing PA-C [name] Pager [phone] 07/31/2015, 8:09 AM

## 2015-08-01 ENCOUNTER — Ambulatory Visit (HOSPITAL_BASED_OUTPATIENT_CLINIC_OR_DEPARTMENT_OTHER): Payer: Medicaid Other

## 2015-08-01 ENCOUNTER — Ambulatory Visit (HOSPITAL_COMMUNITY): Payer: Medicaid Other

## 2015-08-01 DIAGNOSIS — T420X1A Poisoning by hydantoin derivatives, accidental (unintentional), initial encounter: Secondary | ICD-10-CM

## 2015-08-01 DIAGNOSIS — R479 Unspecified speech disturbances: Secondary | ICD-10-CM | POA: Diagnosis not present

## 2015-08-01 DIAGNOSIS — G459 Transient cerebral ischemic attack, unspecified: Secondary | ICD-10-CM

## 2015-08-01 DIAGNOSIS — G40909 Epilepsy, unspecified, not intractable, without status epilepticus: Secondary | ICD-10-CM | POA: Diagnosis not present

## 2015-08-01 LAB — HEMOGLOBIN A1C
Hgb A1c MFr Bld: 5.5 % (ref 4.8–5.6)
Mean Plasma Glucose: 111 mg/dL

## 2015-08-01 LAB — CBC
HCT: 45.3 % (ref 39.0–52.0)
Hemoglobin: 15.6 g/dL (ref 13.0–17.0)
MCH: 32.2 pg (ref 26.0–34.0)
MCHC: 34.4 g/dL (ref 30.0–36.0)
MCV: 93.6 fL (ref 78.0–100.0)
Platelets: 195 10*3/uL (ref 150–400)
RBC: 4.84 MIL/uL (ref 4.22–5.81)
RDW: 14.4 % (ref 11.5–15.5)
WBC: 7.1 10*3/uL (ref 4.0–10.5)

## 2015-08-01 LAB — BASIC METABOLIC PANEL
Anion gap: 9 (ref 5–15)
BUN: 9 mg/dL (ref 6–20)
CO2: 26 mmol/L (ref 22–32)
Calcium: 9 mg/dL (ref 8.9–10.3)
Chloride: 103 mmol/L (ref 101–111)
Creatinine, Ser: 0.89 mg/dL (ref 0.61–1.24)
GFR calc Af Amer: >60 mL/min (ref >60)
GFR calc non Af Amer: >60 mL/min (ref >60)
Glucose, Bld: 99 mg/dL (ref 65–99)
Potassium: 3.8 mmol/L (ref 3.5–5.1)
Sodium: 138 mmol/L (ref 135–145)

## 2015-08-01 LAB — PHENYTOIN LEVEL, TOTAL: Phenytoin Lvl: 27.9 ug/mL — ABNORMAL HIGH (ref 10.0–20.0)

## 2015-08-01 MED ORDER — SORBITOL 70 % SOLN
960.0000 mL | TOPICAL_OIL | Freq: Once | ORAL | Status: AC
  Administered 2015-08-01: 960 mL via RECTAL
  Filled 2015-08-01: qty 240

## 2015-08-01 MED ORDER — METOPROLOL TARTRATE 25 MG PO TABS
25.0000 mg | ORAL_TABLET | Freq: Two times a day (BID) | ORAL | Status: DC
  Administered 2015-08-01 – 2015-08-02 (×3): 25 mg via ORAL
  Filled 2015-08-01 (×3): qty 1

## 2015-08-01 MED ORDER — TESTOSTERONE CYPIONATE 200 MG/ML IM SOLN
400.0000 mg | Freq: Once | INTRAMUSCULAR | Status: AC
  Administered 2015-08-01: 400 mg via INTRAMUSCULAR
  Filled 2015-08-01: qty 2

## 2015-08-01 MED ORDER — METOPROLOL TARTRATE 50 MG PO TABS: 50.0000 mg | ORAL_TABLET | Freq: Every day | ORAL | Status: DC

## 2015-08-01 NOTE — Progress Notes (Signed)
Routine EEG completed, results pending. 

## 2015-08-01 NOTE — Progress Notes (Signed)
Family Medicine Teaching Service Daily Progress Note Intern Pager: [phone]  Patient name: [name] Medical record number: [id number] Date of birth: [birth year] Age: 59 y.o. Gender: male  Primary Care Provider: Chrisandra Netters, MD Consultants: Neurology Code Status: DNR  Pt Overview and Major Events to Date:  - 07/29/15 CT head negative  - 07/29/15 MRA/MRI without arterial occlusion or significant stenosis - 8/13 - Symptoms likely polypharmacy. Toxic Phenytoin level @ 32.2 (holding until down to 20) - 8/14 - Phenytoin held, normally 300mg -ER daily - 8/14 - Pending Amitriptyline Level, (unable to obtain lacosamide level). Neurology signed off - 8/15 - Phenytoin level @ 27.9  Assessment and Plan: [name] is a 59 y.o. male presenting with slurred speech suspicious for TIA. PMH is significant for seizure disorder, severe schizoaffective disorder, depression, anxiety, HTN, HLD. Phenytoin level elevated in toxic range at 32.2 (albumin corrected to 37.4), as likely etiology. Otherwise, work-up for TIA/CVA negative with MRI and MRA unremarkable for acute changes, with known chronic changes. Will complete stroke work-up and obtain EEG due to episode of staring off 07/30/15. Nystagmus and hand flapping resolved.   # Slurred speech, likely in setting of polypharmacy with toxic Phenytoin level - Resolved. Phenytoin level still elevated at 27.9.  Complex PMH with neuro/psych disorders including seizure disorder, schizoaffective disorder, on multiple AEDs and anti-psychotic meds at baseline, all of which can be sedating and cause complications with polypharmacy.  - Neurology consulted, suspect toxic phenytoin is etiology of symptoms. Signed off 07/31/15. Follow-up outpt. - Continue to hold phenytoin, goal < 20 therapeutic, then would consult pharmacy for dosing adjustment on discharge - Continue ASA - Carotid duplex ordered  - PT/OT/speech  # Hypertension, elevated overnight to systolic in  341P-379K - Restart metoprolol 25 mg BID. If does well, restart home XR 50 mg tomorrow.   # Seizure disorder, controlled: Concern for episode 07/30/15 due to staring spell.  No evidence of active / breakthrough seizure. - EEG today 08/01/15 - Continue to hold phenytoin for elevated level of 27.9 - continue lacosamide 150mg  BID   # Androgen deficiency - Patient due for testosterone shot. Will order for today.   # Vertigo, improved - PT vestibular eval  - meclizine 25mg  TID PRN   # Schizoaffective disorder/depression/anxiety: was recently at behavorial health for SI end of July, has appt for f/u scheduled on 8/15. No current SI.  - continue seroquel 600mg  qhs  - continue amitriptyline 300mg  qhs (following up amitriptyline level) - klonopin 2mg  TID PRN   # HLD:  lipid panel WNL - Continue atorvastatin   # Constipation: Pt reports he has not had a bowel movement in a week.  - Miralax daily. Will escalate regimen after speaking with patient.  - Consider enema.   FEN/GI: reg diet, SLIV Prophylaxis: lovenox sq  Disposition: Admitted for work-up possible CVA, since negative work-up with MRI / MRA, Neurology signed off, after likely etiology is polypharmacy and toxic phenytoin level. Continue to hold. Possible discharge in 1-2 days, pending PT/OT.  Subjective:  Improved today per patient. He feels his speech is back to normal. Improved vertigo and dizziness, states it worsens when he is moving. He would like to try to work with PT again today. He was able to grab something from across the room yesterday without feeling very weak.   Objective: Temp:  [97.8 F (36.6 C)-99 F (37.2 C)] 97.9 F (36.6 C) (08/15 0500) Pulse Rate:  [81-93] 81 (08/15 0500) Resp:  [16-20] 18 (08/15  0500) BP: (121-160)/(66-96) 144/84 mmHg (08/15 0500) SpO2:  [95 %-100 %] 98 % (08/15 0500) Physical Exam: General: Resting in bed, comfortable, NAD  Eyes: PERRL, EOMI Cardiovascular: RRR, no murmurs, rubs or  gallops. Respiratory: CTAB, good air movement Abdomen: soft, NTND, +BS Neuro: AAOx4. 5/5 strength in all extremities. CN II-XII intact. Resolved slurred speech. No nystagmus or hand flapping noted.   Laboratory:  Recent Labs Lab 07/25/15 2300 07/29/15 1526 07/29/15 1538 08/01/15 0328  WBC 7.4 7.4  --  7.1  HGB 15.7 15.3 16.0 15.6  HCT 44.7 43.8 47.0 45.3  PLT 223 195  --  195    Recent Labs Lab 07/25/15 2143  07/29/15 1526 07/29/15 1538 07/30/15 1449 08/01/15 0328  NA 136  < > 138 136 137 138  K 4.0  < > 4.2 5.3* 4.4 3.8  CL 104  < > 105 103 103 103  CO2 22  --  24  --  22 26  BUN 8  < > 8 12 8 9   CREATININE 0.85  < > 1.06 0.90 0.91 0.89  CALCIUM 8.9  --  8.7*  --  8.3* 9.0  PROT 6.5  --  6.4*  --   --   --   BILITOT 0.2*  --  0.7  --   --   --   ALKPHOS 85  --  82  --   --   --   ALT 14*  --  16*  --   --   --   AST 18  --  25  --   --   --   GLUCOSE 114*  < > 133* 128* 96 99  < > = values in this interval not displayed.  Imaging/Diagnostic Tests:  - 07/30/15 ECHO - normal EF 60-65%, no evidence of thrombus  Ct Head Wo Contrast  07/29/2015   CLINICAL DATA:  Code stroke.  Sudden onset of slurred speech.  EXAM: CT HEAD WITHOUT CONTRAST  TECHNIQUE: Contiguous axial images were obtained from the base of the skull through the vertex without intravenous contrast.  COMPARISON:  MRI brain 07/25/2015 and CT scan 05/21/2014  FINDINGS: The ventricles are normal in size and configuration. No extra-axial fluid collections are identified. The gray-white differentiation is normal. No CT findings for acute intracranial process such as hemorrhage or infarction. No mass lesions. The brainstem and cerebellum are grossly normal.  The bony structures are intact. The paranasal sinuses and mastoid air cells are clear. The globes are intact.  IMPRESSION: No acute intracranial findings.  No change since prior examination.   Electronically Signed   By: Marijo Sanes M.D.   On: 07/29/2015 15:28    Ct Head Wo Contrast  07/25/2015   CLINICAL DATA:  Acute onset of dizziness, nausea and vertigo. Initial encounter.  EXAM: CT HEAD WITHOUT CONTRAST  TECHNIQUE: Contiguous axial images were obtained from the base of the skull through the vertex without intravenous contrast.  COMPARISON:  CT of the head performed 06/01/2015  FINDINGS: There is no evidence of acute infarction, mass lesion, or intra- or extra-axial hemorrhage on CT.  Prominence of the sulci suggest mild cortical volume loss. Mild cerebellar atrophy is noted. Periventricular white matter change likely reflects small vessel ischemic microangiopathy.  The brainstem and fourth ventricle are within normal limits. The basal ganglia are unremarkable in appearance. The cerebral hemispheres demonstrate grossly normal gray-white differentiation. No mass effect or midline shift is seen.  There is no evidence of fracture; visualized osseous  structures are unremarkable in appearance. The orbits are within normal limits. The paranasal sinuses and mastoid air cells are well-aerated. No significant soft tissue abnormalities are seen.  IMPRESSION: 1. No acute intracranial pathology seen on CT. 2. Mild cortical volume loss and scattered small vessel ischemic microangiopathy.   Electronically Signed   By: Garald Balding M.D.   On: 07/25/2015 22:20   Mr Brain Wo Contrast  07/29/2015   CLINICAL DATA:  TIA. Acute onset slurred speech and confusion earlier today. Vertigo for 5 days. History of seizure disorder.  EXAM: MRI HEAD WITHOUT CONTRAST  MRA HEAD WITHOUT CONTRAST  TECHNIQUE: Multiplanar, multiecho pulse sequences of the brain and surrounding structures were obtained without intravenous contrast. Angiographic images of the head were obtained using MRA technique without contrast.  COMPARISON:  Head CT 07/29/2015.  Head MRI 07/25/2015.  FINDINGS: MRI HEAD FINDINGS  Dedicated thin section imaging through the temporal lobes demonstrates normal volume and signal of the  hippocampi. There is no evidence of acute infarct, intracranial hemorrhage, mass, midline shift, or extra-axial fluid collection. There is mild generalized cerebral atrophy. Scattered, small foci of T2 hyperintensity in the cerebral white matter are unchanged from the recent prior MRI and nonspecific but compatible with minimal chronic small vessel ischemic disease.  Orbits are unremarkable. Paranasal sinuses and mastoid air cells are clear. Major intracranial vascular flow voids are preserved.  MRA HEAD FINDINGS  The visualized distal vertebral arteries are patent with the right being dominant. Right PICA origin is patent. Left AICA is patent and dominant. SCA origins are patent, although the left SCA is small and not well evaluated. Basilar artery is patent without stenosis. There are fetal type origins of both PCAs. No significant PCA stenosis is seen.  Internal carotid arteries are patent from skullbase to carotid termini. ACAs and MCAs are unremarkable. No intracranial aneurysm is identified.  IMPRESSION: 1. Unchanged appearance of the brain without evidence of acute intracranial abnormality. 2. No major intracranial arterial occlusion or significant stenosis.   Electronically Signed   By: Logan Bores   On: 07/29/2015 21:40   Mr Brain Wo Contrast  07/26/2015   CLINICAL DATA:  Initial evaluation for acute nausea, dizziness for several days.  EXAM: MRI HEAD WITHOUT CONTRAST  TECHNIQUE: Multiplanar, multiecho pulse sequences of the brain and surrounding structures were obtained without intravenous contrast.  COMPARISON:  Prior CT from earlier the same day.  FINDINGS: Mild diffuse prominence of the CSF containing spaces is compatible with generalized age-related cerebral atrophy. Mild T2/FLAIR hyperintense foci within the periventricular white matter, nonspecific, but most likely related to very minimal chronic small vessel ischemic disease.  No abnormal foci of restricted diffusion to suggest acute intracranial  infarct. Gray-white matter differentiation maintained. Normal intravascular flow voids are preserved. No acute or chronic intracranial hemorrhage.  No mass lesion, midline shift, or mass effect. No hydrocephalus. No extra-axial fluid collection.  Craniocervical junction within normal limits. Pituitary gland normal.  No acute abnormality about the orbits. Mild opacity present within the right ethmoidal air cells. Paranasal sinuses are otherwise largely clear. No mastoid effusion. Inner ear structures within normal limits.  Bone marrow signal intensity normal. Scalp soft tissues unremarkable.  IMPRESSION: 1. No acute intracranial infarct or other abnormality identified. 2. Mild age-related cerebral atrophy with chronic microvascular ischemic disease.   Electronically Signed   By: Jeannine Boga M.D.   On: 07/26/2015 01:24   Mr Jodene Nam Head/brain Wo Cm  07/29/2015   CLINICAL DATA:  TIA. Acute onset  slurred speech and confusion earlier today. Vertigo for 5 days. History of seizure disorder.  EXAM: MRI HEAD WITHOUT CONTRAST  MRA HEAD WITHOUT CONTRAST  TECHNIQUE: Multiplanar, multiecho pulse sequences of the brain and surrounding structures were obtained without intravenous contrast. Angiographic images of the head were obtained using MRA technique without contrast.  COMPARISON:  Head CT 07/29/2015.  Head MRI 07/25/2015.  FINDINGS: MRI HEAD FINDINGS  Dedicated thin section imaging through the temporal lobes demonstrates normal volume and signal of the hippocampi. There is no evidence of acute infarct, intracranial hemorrhage, mass, midline shift, or extra-axial fluid collection. There is mild generalized cerebral atrophy. Scattered, small foci of T2 hyperintensity in the cerebral white matter are unchanged from the recent prior MRI and nonspecific but compatible with minimal chronic small vessel ischemic disease.  Orbits are unremarkable. Paranasal sinuses and mastoid air cells are clear. Major intracranial vascular  flow voids are preserved.  MRA HEAD FINDINGS  The visualized distal vertebral arteries are patent with the right being dominant. Right PICA origin is patent. Left AICA is patent and dominant. SCA origins are patent, although the left SCA is small and not well evaluated. Basilar artery is patent without stenosis. There are fetal type origins of both PCAs. No significant PCA stenosis is seen.  Internal carotid arteries are patent from skullbase to carotid termini. ACAs and MCAs are unremarkable. No intracranial aneurysm is identified.  IMPRESSION: 1. Unchanged appearance of the brain without evidence of acute intracranial abnormality. 2. No major intracranial arterial occlusion or significant stenosis.   Electronically Signed   By: Logan Bores   On: 07/29/2015 21:40     Keary Waterson Corinda Gubler, MD 08/01/2015, 7:16 AM PGY-1, [place] Intern pager: [phone], text pages welcome

## 2015-08-01 NOTE — Evaluation (Signed)
Occupational Therapy Evaluation Patient Details Name: [name] MRN: [id number] DOB: June 04, 1956 Today's Date: 08/01/2015    History of Present Illness [name] is a 59 y.o. male presenting with slurred speech suspicious for TIA. PMH is significant for seizure disorder, severe schizoaffective disorder, depression, anxiety, HTN, HLD. Found to have toxic Dilantin level.   Clinical Impression   PT admitted with slurred and r/o TIA. Pt currently with functional limitiations due to the deficits listed below (see OT problem list). PTA living at home alone at MOD I level. Pt will benefit from skilled OT to increase their independence and safety with adls and balance to allow discharge [place].      Follow Up Recommendations  Home health OT    Equipment Recommendations  Other (comment) (TBA - currently will need a seat for tub )    Recommendations for Other Services       Precautions / Restrictions Precautions Precautions: Fall      Mobility Bed Mobility Overal bed mobility: Needs Assistance Bed Mobility: Supine to Sit     Supine to sit: Supervision     General bed mobility comments: incr time and use of bed rail to exit on the R  Transfers Overall transfer level: Needs assistance Equipment used: Rolling [name] (2 wheeled) Transfers: Sit to/from Stand Sit to Stand: Min guard         General transfer comment: cues for placement in the RW and hand placement    Balance Overall balance assessment: Needs assistance         Standing balance support: No upper extremity supported;During functional activity Standing balance-Leahy Scale: Fair Standing balance comment: leaning against sink with UB                            ADL Overall ADL's : Needs assistance/impaired Eating/Feeding: Set up;Bed level   Grooming: Oral care;Wash/dry hands;Min guard;Standing Grooming Details (indicate cue type and reason): pt noted to have some ataxic movement with oral  care. Pt able to complete task and open containers             Lower Body Dressing: Supervision/safety;Bed level Lower Body Dressing Details (indicate cue type and reason): crossing bil LE to adjust socks supine Toilet Transfer: Min guard;RW           Functional mobility during ADLs: Rolling [name];Min guard General ADL Comments: pt completed bed mobility and sink level adls. Pt noted to have ataxic movement      Vision     Perception     Praxis      Pertinent Vitals/Pain Pain Assessment: No/denies pain     Hand Dominance Left   Extremity/Trunk Assessment Upper Extremity Assessment Upper Extremity Assessment: Overall WFL for tasks assessed (ataxic movement noted)   Lower Extremity Assessment Lower Extremity Assessment: Defer to PT evaluation   Cervical / Trunk Assessment Cervical / Trunk Assessment: Normal   Communication Communication Communication: No difficulties   Cognition Arousal/Alertness: Awake/alert Behavior During Therapy: WFL for tasks assessed/performed Overall Cognitive Status: Within Functional Limits for tasks assessed                     General Comments       Exercises       Shoulder Instructions      Home Living Family/patient expects to be discharged to:: Private residence Living Arrangements: Alone Available Help at Discharge: Other (Comment) Type of Home: Apartment Home Access:  Stairs to enter [employer] of Steps: 3 Entrance Stairs-Rails: None Home Layout: One level     Bathroom Shower/Tub: Walk-in shower;Tub/shower unit   Bathroom Toilet: Standard     Home Equipment: Kasandra Knudsen - single point      Lives With: Alone    Prior Functioning/Environment Level of Independence: Independent        Comments: Hx of seizures and schizoaffective disorder; cannot drive.    OT Diagnosis: Generalized weakness;Ataxia   OT Problem List: Decreased strength;Decreased activity tolerance;Impaired balance (sitting  and/or standing);Decreased safety awareness;Decreased knowledge of use of DME or AE;Decreased coordination   OT Treatment/Interventions: Self-care/ADL training;Therapeutic exercise;DME and/or AE instruction;Therapeutic activities;Patient/family education;Balance training    OT Goals(Current goals can be found in the care plan section) Acute Rehab OT Goals Patient Stated Goal: None stated OT Goal Formulation: With patient Time For Goal Achievement: 08/15/15 Potential to Achieve Goals: Good  OT Frequency: Min 2X/week   Barriers to D/C:            Co-evaluation              End of Session Equipment Utilized During Treatment: Gait belt;Rolling [name] Nurse Communication: Mobility status;Precautions  Activity Tolerance: Patient tolerated treatment well Patient left: in chair;with call bell/phone within reach;with chair alarm set   Time: 1435-1455 OT Time Calculation (min): 20 min Charges:  OT General Charges $OT Visit: 1 Procedure OT Evaluation $Initial OT Evaluation Tier I: 1 Procedure G-Codes:    Peri Maris 08/24/15, 3:02 PM   Jeri Modena   OTR/L Pager: [phone] Office: [phone] .

## 2015-08-01 NOTE — Procedures (Signed)
ELECTROENCEPHALOGRAM REPORT   Patient: [name]      Room #: 1T-05 Age: 59 y.o.        Sex: male Referring Physician: Dr Mingo Amber Report Date:  08/01/2015        Interpreting Physician: Hulen Luster  History: [name] is an 59 y.o. male hx of prior stroke, seizures admitted with AMS. Found to have supra-therapeutic dilantin level  Medications:  Scheduled: . amitriptyline  300 mg Oral QHS  . aspirin EC  81 mg Oral Daily  . atorvastatin  40 mg Oral q1800  . docusate sodium  100 mg Oral BID  . enoxaparin (LOVENOX) injection  40 mg Subcutaneous Q24H  . lacosamide  150 mg Oral BID  . meclizine  25 mg Oral TID  . metoprolol tartrate  25 mg Oral BID  . polyethylene glycol  17 g Oral Daily  . QUEtiapine  600 mg Oral QHS    Conditions of Recording:  This is a 19 channel EEG carried out with the patient in the drowsy state.  Description:  The waking background activity consists of a low voltage, symmetrical, fairly well organized,9-10 Hz alpha activity, seen from the parieto-occipital and posterior temporal regions. Frequent theta activity is intermixed into the background rhythm. No focal slowing or epileptiform activity is noted.   The patient drowses with slowing to irregular, low voltage theta and beta activity. Normal slep architecture is not observed. Hyperventilation was not performed. Intermittent photic stimulation was not performed.    IMPRESSION: Normal electroencephalogram. There are no focal lateralizing or epileptiform features.   Jim Like, DO [name] [phone]  If 7pm- 7am, please page neurology on call as listed in [place]. 08/01/2015, 11:48 AM

## 2015-08-01 NOTE — Progress Notes (Signed)
Rehab Admissions Coordinator Note:  Patient was screened by Cleatrice Burke for appropriateness for an Inpatient Acute Rehab Consult. Per PT recommendation. OT recommends HH.  At this time, we are recommending HH. Pt ambulating 400 ft without AD and I can not justify an inpt admission to rehab at this high functional level. Please call me with any questions.  Cleatrice Burke 08/01/2015, 5:39 PM  I can be reached at [phone].

## 2015-08-01 NOTE — Evaluation (Signed)
Speech Language Pathology Evaluation Patient Details Name: [name] MRN: [id number] DOB: 27-Sep-1956 Today's Date: 08/01/2015 Time: 0160-1093 SLP Time Calculation (min) (ACUTE ONLY): 18 min  Problem List:  Patient Active Problem List   Diagnosis Date Noted  . Phenytoin toxicity   . Speech abnormality 07/29/2015  . TIA (transient ischemic attack) 07/29/2015  . Vertigo 07/29/2015  . Schizoaffective disorder, depressive type with good prognostic features 07/13/2015  . Abdominal pain   . Constipation   . Verruca 05/18/2015  . Seborrheic dermatitis of scalp 05/18/2015  . Blood poisoning   . MDD (major depressive disorder), recurrent severe, without psychosis   . OCD (obsessive compulsive disorder)   . Confusion 12/31/2014  . Seizures   . Schizoaffective disorder, depressive type   . Orthostatic hypotension   . Falls   . Seizure 11/18/2014  . Schizoaffective disorder, unspecified type   . Essential hypertension   . Overdose of beta-adrenergic antagonist drug 10/22/2014  . Altered mental status   . Overdose   . Nocturnal enuresis 03/23/2014  . Benzodiazepine dependence, continuous 01/27/2014  . Opiate dependence 01/22/2014  . Severe major depression 01/21/2014  . Frozen shoulder syndrome 09/20/2013  . Somnolence 09/20/2013  . Schizoaffective disorder 09/20/2013  . Malaise and fatigue 04/21/2013  . Presbyopia 04/25/2012  . Androgen deficiency 09/07/2011  . Constipation, chronic 09/07/2011  . Other specified disorders of liver 12/30/2008  . Overweight(278.02) 09/08/2008  . Hyperlipidemia 01/28/2008  . Seizure disorder 01/28/2008  . OSTEOPENIA 07/01/2007   Past Medical History:  Past Medical History  Diagnosis Date  . Seizures   . Hypertension   . GERD (gastroesophageal reflux disease)   . Dyslipidemia   . Schizoaffective disorder   . Depression   . Anxiety   . MRSA carrier   . Constipation   . Memory loss   . Hearing loss    Past Surgical History:  Past  Surgical History  Procedure Laterality Date  . Self orchectomy    . Colonoscopy  2010  . Orif shoulder fracture    . Shoulder closed reduction Left 09/16/2013    Procedure: CLOSED REDUCTION SHOULDER;  Surgeon: Mauri Pole, MD;  Location: [facility];  Service: Orthopedics;  Laterality: Left;  . Shoulder hemi-arthroplasty Left 09/18/2013    Procedure: LEFT SHOULDER HEMI-ARTHROPLASTY;  Surgeon: Augustin Schooling, MD;  Location: [place];  Service: Orthopedics;  Laterality: Left;  . Carpal tunnel release     HPI:  59 yo M with extensive PMH who presented with 1 day history of slurred speech. Started yesterday afternoon. Reports some dizziness at the time which has resolved. Per notes, slurred speech resolved in ED. Per patient, never went away. Vertiginous symptoms intermittently at home for past several days at least. Has also had increasing seizures in past several weeks as well. Recent MRI of brain for his vertigo which didn't reveal any central cause. On exam this AM, still with slurred speech. Feels despondent about his situation, denies any overt SI/HI. Heart RRR. Abd benign. No LE edema. Neuro exam significant only for slurred speech +/- minor Right facial droop that may or may not be chronic per patient. Extinguishable nystagmus. Otherwise no deficits.    Assessment / Plan / Recommendation Clinical Impression  Cognitive/linguistic evaluation was completed.  The patient scored a 24/30 on the Mini Mental State Exam.  Deficits were noted for short term recall and attention.  In addition, the patient reported mild word finding issues and this was noted mildly during conversation.  Motor  speech appeared to be intact.  Recommend follow up ST at the next level of care for follow up to address short term memory deficits and to further assess language skills.  Message was left for SW requesting either HHST or outpatient ST.      SLP Assessment  Patient needs continued Speech Lanaguage Pathology  Services    Follow Up Recommendations  Home health SLP vs Outpatient SLP    Frequency and Duration min 1 x/week  2 weeks   Pertinent Vitals/Pain Pain Assessment: No/denies pain   SLP Goals  Patient/Family Stated Goal: None stated. Potential to Achieve Goals (ACUTE ONLY): Good  SLP Evaluation Prior Functioning  Cognitive/Linguistic Baseline: Baseline deficits Baseline deficit details: Per the patient he has been having memory issues for about 2-3 years and word finding issues for about 1.5 years.   Type of Home: Apartment  Lives With: Alone Available Help at Discharge: Other (Comment) (Girlfriend)   Cognition  Orientation Level: Oriented X4;Oriented to person;Oriented to place;Oriented to time;Oriented to situation Attention: Sustained Sustained Attention: Appears intact Memory: Impaired Memory Impairment: Decreased recall of new information Awareness: Appears intact Problem Solving: Appears intact Safety/Judgment: Appears intact    Comprehension  Auditory Comprehension Overall Auditory Comprehension: Appears within functional limits for tasks assessed Commands: Within Functional Limits Conversation: Complex Reading Comprehension Reading Status: Within funtional limits    Expression Expression Primary Mode of Expression: Verbal Verbal Expression Overall Verbal Expression: Appears within functional limits for tasks assessed Initiation: No impairment Automatic Speech: Name;Social Response Level of Generative/Spontaneous Verbalization: Conversation Repetition: No impairment Naming: No impairment Pragmatics: No impairment Non-Verbal Means of Communication: Not applicable   Oral / Motor Motor Speech Overall Motor Speech: Appears within functional limits for tasks assessed Respiration: Within functional limits Phonation: Normal Resonance: Within functional limits Articulation: Within functional limitis Intelligibility: Intelligible Motor Planning: Witnin functional  limits Motor Speech Errors: Not applicable   GO     Lamar Sprinkles 08/01/2015, 12:09 PM  Shelly Flatten, [place], [place] Acute Rehab SLP [phone]

## 2015-08-01 NOTE — Progress Notes (Signed)
Physical Therapy Treatment Patient Details Name: [name] MRN: [id number] DOB: 09-02-1956 Today's Date: 08/01/2015    History of Present Illness [name] is a 59 y.o. male presenting with slurred speech suspicious for TIA. PMH is significant for seizure disorder, severe schizoaffective disorder, depression, anxiety, HTN, HLD. Found to have toxic Dilantin level.    PT Comments    Pt with improved ambulation tolerance this date but con't to demo ataxic, impaired proprioception and co-ordination, and onset of dizziness with head turns to the Left. Pt functioning independently PTA and now requires assist for OOB mobility due to high falls risk. Pt to strongly benefit from short CIR stay to achieve safe mod I level for safe transition home. Pt demo's excellent rehab potential. Pt also stats he can talk to his girlfriend able staying with him.   Follow Up Recommendations  CIR     Equipment Recommendations  None recommended by PT    Recommendations for Other Services       Precautions / Restrictions Precautions Precautions: Fall Restrictions Weight Bearing Restrictions: No    Mobility  Bed Mobility Overal bed mobility: Needs Assistance Bed Mobility: Supine to Sit     Supine to sit: Supervision     General bed mobility comments: pt up in chair  Transfers Overall transfer level: Needs assistance Equipment used: None Transfers: Sit to/from Stand Sit to Stand: Min assist         General transfer comment: v/c's to push up from chair, unsteady upon initiatl stand but able to steady self  Ambulation/Gait Ambulation/Gait assistance: Min assist Ambulation Distance (Feet): 400 Feet Assistive device: None Gait Pattern/deviations: Step-through pattern;Ataxic;Staggering left;Staggering right;Wide base of support Gait velocity: vairable   General Gait Details: moderately ataxic, altared gait pattern with head turns, stepping over object   Stairs Stairs: Yes Stairs  assistance: Mod assist Stair Management: One rail Left;Alternating pattern;Step to pattern Number of Stairs: 3 General stair comments: pt attempted to ascend stairs reciprocally but had LOB requiring modA to maintain balance  Wheelchair Mobility    Modified Rankin (Stroke Patients Only) Modified Rankin (Stroke Patients Only) Pre-Morbid Rankin Score: No symptoms Modified Rankin: Moderately severe disability     Balance Overall balance assessment: Needs assistance         Standing balance support: No upper extremity supported Standing balance-Leahy Scale: Fair Standing balance comment: pt requires external support to steady self                    Cognition Arousal/Alertness: Awake/alert Behavior During Therapy: WFL for tasks assessed/performed Overall Cognitive Status: Within Functional Limits for tasks assessed                      Exercises      General Comments        Pertinent Vitals/Pain Pain Assessment: No/denies pain    Home Living Family/patient expects to be discharged to:: Private residence Living Arrangements: Alone Available Help at Discharge: Other (Comment) Type of Home: Apartment Home Access: Stairs to enter Entrance Stairs-Rails: None Home Layout: One level Home Equipment: Cane - single point      Prior Function Level of Independence: Independent      Comments: Hx of seizures and schizoaffective disorder; cannot drive.   PT Goals (current goals can now be found in the care plan section) Acute Rehab PT Goals Patient Stated Goal: none Progress towards PT goals: Progressing toward goals    Frequency  Min 4X/week  PT Plan Discharge plan needs to be updated;Frequency needs to be updated    Co-evaluation             End of Session Equipment Utilized During Treatment: Gait belt Activity Tolerance: Patient tolerated treatment well Patient left: in chair;with call bell/phone within reach;with chair alarm set      Time: 1530-1550 PT Time Calculation (min) (ACUTE ONLY): 20 min  Charges:  $Gait Training: 8-22 mins                    G Codes:      Kingsley Callander 08/01/2015, 4:18 PM   Kittie Plater, PT, DPT Pager #: [phone] Office #: [phone]

## 2015-08-01 NOTE — Progress Notes (Signed)
*  PRELIMINARY RESULTS* Vascular Ultrasound Carotid Duplex (Doppler) has been completed.   Findings suggest 1-39% internal carotid artery stenosis bilaterally. Vertebral arteries are patent with antegrade flow.  08/01/2015 10:31 AM Maudry Mayhew, RVT, RDCS, RDMS

## 2015-08-02 ENCOUNTER — Telehealth: Payer: Self-pay | Admitting: Family Medicine

## 2015-08-02 ENCOUNTER — Telehealth: Payer: Self-pay | Admitting: Neurology

## 2015-08-02 DIAGNOSIS — T420X1A Poisoning by hydantoin derivatives, accidental (unintentional), initial encounter: Secondary | ICD-10-CM | POA: Diagnosis not present

## 2015-08-02 DIAGNOSIS — R479 Unspecified speech disturbances: Secondary | ICD-10-CM | POA: Diagnosis not present

## 2015-08-02 DIAGNOSIS — F251 Schizoaffective disorder, depressive type: Secondary | ICD-10-CM | POA: Diagnosis not present

## 2015-08-02 DIAGNOSIS — E785 Hyperlipidemia, unspecified: Secondary | ICD-10-CM | POA: Diagnosis not present

## 2015-08-02 LAB — BASIC METABOLIC PANEL
Anion gap: 9 (ref 5–15)
BUN: 9 mg/dL (ref 6–20)
CO2: 24 mmol/L (ref 22–32)
Calcium: 8.8 mg/dL — ABNORMAL LOW (ref 8.9–10.3)
Chloride: 102 mmol/L (ref 101–111)
Creatinine, Ser: 0.91 mg/dL (ref 0.61–1.24)
GFR calc Af Amer: >60 mL/min (ref >60)
GFR calc non Af Amer: >60 mL/min (ref >60)
Glucose, Bld: 97 mg/dL (ref 65–99)
Potassium: 4.1 mmol/L (ref 3.5–5.1)
Sodium: 135 mmol/L (ref 135–145)

## 2015-08-02 LAB — CBC
HCT: 45.2 % (ref 39.0–52.0)
Hemoglobin: 15.6 g/dL (ref 13.0–17.0)
MCH: 31.7 pg (ref 26.0–34.0)
MCHC: 34.5 g/dL (ref 30.0–36.0)
MCV: 91.9 fL (ref 78.0–100.0)
Platelets: 188 10*3/uL (ref 150–400)
RBC: 4.92 MIL/uL (ref 4.22–5.81)
RDW: 14.5 % (ref 11.5–15.5)
WBC: 7.7 10*3/uL (ref 4.0–10.5)

## 2015-08-02 MED ORDER — PHENYTOIN 50 MG PO CHEW: CHEWABLE_TABLET | ORAL | Status: DC

## 2015-08-02 NOTE — Discharge Summary (Signed)
[facility] Discharge Summary  Patient name: [name] Medical record number: [id number] Date of birth: 07/24/1956 Age: 59 y.o. Gender: male Date of Admission: 07/29/2015  Date of Discharge: 08/02/2015 Admitting Physician: Alveda Reasons, MD  Primary Care Provider: Chrisandra Netters, MD Consultants: Neurology  Indication for Hospitalization: AMS, slurred speech  Discharge Diagnoses/Problem List:  - Phenytoin toxicity - Seizure disorder - Constipation - Schizoaffective disorder - Depression and anxiety  - Androgen deficiency - Memory loss  Disposition: Home with home health nursing aid for help with medication management  Discharge Condition: Improved  Discharge Exam:  General: Resting in bed, comfortable, NAD  Eyes: PERRL, EOMI Cardiovascular: RRR, no murmurs, rubs or gallops. Respiratory: CTAB, good air movement Abdomen: soft, NTND, +BS Neuro: AOx3. 5/5 strength in all extremities. CN II-XII intact. No nystagmus or hand flapping noted. MOCA performed and scored 19 (difficulty with word recall, cube draw, placement of hands on clock and serial subtractions)  Brief Hospital Course:  [name] is a 59 y.o. male who  presented with slurred speech suspicious for TIA. PMH is significant for seizure disorder, severe schizoaffective disorder, vertigo, depression, anxiety, HTN, HLD and androgen deficiency s/p self-orchiectomy.   He underwent stroke work-up with neurology consulting. CT head was negative, and MRI/MRA were negative for acute changes, arterial occlusion or significant stenosis. He had a staring spell the evening of 07/30/15, concerning for seizure. At that time, he was also found to have nystagmus and hand flapping concerning for medication toxicity given his extensive antiepileptic and antipsychotic medication regimen. He was found to have a toxic phenytoin level at 32.2 (albumin corrected to 37.4), as likely etiology. His phenytoin was  recently increased from 200 mg daily to 300 mg during a hospitalization in June 2016 due to breakthrough seizures. Phenytoin was held and symptoms resolved. EEG did not reveal any seizure activity. After consulting with patient's outpatient neurologist, patient was discharged with instructions to resume phenytoin at 250 mg daily in a couple of days.   Patient did not express any suicidal ideation during this hospitalization and was interested in seeing a psychiatrist regularly. He received a testosterone shot 08/01/15. He had been constipated for a week but successfully had bowel movements after an enema.   Issues for Follow Up:  - Mental status  - How he is taking his medications - Confirm patient has appointment with Neurologist Dr. [name] (who will recheck phenytoin level in 2-3 weeks)  Significant Procedures:  - 08/01/2015 - EEG negative for seizure activity - 07/30/15 ECHO - normal EF 60-65%, no evidence of thrombus  Significant Labs and Imaging:   Recent Labs Lab 07/29/15 1526 07/29/15 1538 08/01/15 0328 08/02/15 0648  WBC 7.4  --  7.1 7.7  HGB 15.3 16.0 15.6 15.6  HCT 43.8 47.0 45.3 45.2  PLT 195  --  195 188    Recent Labs Lab 07/29/15 1526 07/29/15 1538 07/30/15 1449 08/01/15 0328 08/02/15 0648  NA 138 136 137 138 135  K 4.2 5.3* 4.4 3.8 4.1  CL 105 103 103 103 102  CO2 24  --  22 26 24   GLUCOSE 133* 128* 96 99 97  BUN 8 12 8 9 9   CREATININE 1.06 0.90 0.91 0.89 0.91  CALCIUM 8.7*  --  8.3* 9.0 8.8*  ALKPHOS 82  --   --   --   --   AST 25  --   --   --   --   ALT 16*  --   --   --   --  ALBUMIN 3.8  --   --   --   --    Lipid panel (07/30/2015): WNL, LDL 62 Hgb A1c: 5.5 Amitriptyline + nortrip (07/30/15): 281 Phenytoin (07/30/15): 32.2 Phenytoin (08/01/15): 27.9  Results/Tests Pending at Time of Discharge: None  Discharge Medications:    Medication List    STOP taking these medications        phenytoin 100 MG ER capsule  Commonly known as:  DILANTIN       TAKE these medications        amitriptyline 150 MG tablet  Commonly known as:  ELAVIL  Take 2 tablets (300 mg total) by mouth at bedtime.     aspirin EC 81 MG tablet  Take 1 tablet (81 mg total) by mouth daily.     clonazePAM 2 MG tablet  Commonly known as:  KLONOPIN  Take 1 tablet (2 mg total) by mouth 3 (three) times daily.     Lacosamide 150 MG Tabs  Commonly known as:  VIMPAT  Take 1 tablet (150 mg total) by mouth 2 (two) times daily.     loratadine 10 MG tablet  Commonly known as:  CLARITIN  Take 1 tablet (10 mg total) by mouth daily.     meclizine 50 MG tablet  Commonly known as:  ANTIVERT  Take 0.5 tablets (25 mg total) by mouth 3 (three) times daily as needed for dizziness.     meclizine 25 MG tablet  Commonly known as:  ANTIVERT  Take 25 mg by mouth 3 (three) times daily.     metoprolol succinate 50 MG 24 hr tablet  Commonly known as:  TOPROL-XL  TAKE 1 TABLET (50 MG TOTAL) BY MOUTH AT BEDTIME. TAKE WITH OR IMMEDIATELY FOLLOWING A MEAL.     phenytoin 50 MG tablet  Commonly known as:  DILANTIN INFATABS  Take 2 tablets (100mg  total) in the AM and 3 tablets (150mg ) qhs.  Start taking on:  08/04/2015     polyethylene glycol packet  Commonly known as:  MIRALAX / GLYCOLAX  Take 17 g by mouth 2 (two) times daily as needed. For constipation. Mix into 8 ounces of fluid & drink     QUEtiapine 300 MG tablet  Commonly known as:  SEROQUEL  Take 2 tablets (600 mg total) by mouth at bedtime.     senna-docusate 8.6-50 MG per tablet  Commonly known as:  Senokot-S  Take 1 tablet by mouth at bedtime.     simvastatin 40 MG tablet  Commonly known as:  ZOCOR  Take 1 tablet (40 mg total) by mouth at bedtime.     testosterone cypionate 200 MG/ML injection  Commonly known as:  DEPOTESTOSTERONE CYPIONATE  Inject 2 mLs (400 mg total) into the muscle every 14 (fourteen) days.        Discharge Instructions: Please refer to Patient Instructions section of EMR for full  details.  Patient was counseled important signs and symptoms that should prompt return to medical care, changes in medications, dietary instructions, activity restrictions, and follow up appointments.   Follow-Up Appointments: Follow-up Information    Follow up with [facility] On 08/04/2015.   Why:  appointment at 1pm    Contact information:   [place] [phone]      Follow up with [facility].   Why:  For hospital follow-up, the office will call you for an appointment.  Call them if they have not called you within 1 week  for follow-up   Contact information:   [address] [place] [address] [phone]      Follow up with Chrisandra Netters, MD On 08/10/2015.   Specialty:  Family Medicine   Why:  For hospital follow-up, appointment made at 945am   Contact information:   [place] [address] [phone]       Rogue Bussing, MD 08/02/2015, 5:03 PM PGY-1, [place]

## 2015-08-02 NOTE — Progress Notes (Signed)
SLP addendum-late entry    08/01/15 04/04/01  SLP G-Codes **NOT FOR INPATIENT CLASS**  Functional Assessment Tool Used (skilled clinical judgement)  Functional Limitations Memory  Memory Current Status (B0962) CJ  Memory Goal Status (E3662) CI  SLP Evaluations  $ SLP Speech Visit 1 Procedure  SLP Evaluations  $ SLP EVAL LANGUAGE/SOUND PRODUCTION 1 Procedure    Orbie Pyo Travious Vanover M.Ed [employer] [phone]

## 2015-08-02 NOTE — Progress Notes (Signed)
Physical Therapy Treatment Patient Details Name: [name] MRN: [id number] DOB: [birth year] Today's Date: 08/02/2015    History of Present Illness [name] is a 59 y.o. male presenting with slurred speech suspicious for TIA. PMH is significant for seizure disorder, severe schizoaffective disorder, depression, anxiety, HTN, HLD. Found to have toxic Dilantin level.    PT Comments    Pt con't to demo ataxia, balance impairment and impaired proprioception and coordination. Pt remains unsafe to return home alone due to high falls risk and requires 24/7 supervision upon d/c. CIR denied pt admission however pt to con't to benefit from con't therapy upon d/c to progress to safe mod I level of function as he currently requires minA for safety for all OOB mobility.   Follow Up Recommendations  CIR - aware CIR has denied him. Due to this I recommend HHPT and 24/7 supervision due to patient at high falls risk. Unsure if medicaid will cover HHPT     Equipment Recommendations  Rolling walker with 5" wheels    Recommendations for Other Services       Precautions / Restrictions Precautions Precautions: Fall Restrictions Weight Bearing Restrictions: No    Mobility  Bed Mobility Overal bed mobility: Needs Assistance Bed Mobility: Supine to Sit     Supine to sit: Supervision Sit to supine: Supervision      Transfers Overall transfer level: Needs assistance Equipment used: None;Rolling walker (2 wheeled) Transfers: Sit to/from Stand Sit to Stand: Min guard         General transfer comment: v/c's to push up from bed, increased time, stood still for 1 min upon standing to get bearings  Ambulation/Gait Ambulation/Gait assistance: Min assist Ambulation Distance (Feet): 400 Feet (x2, 200x1) Assistive device: None;Rolling walker (2 wheeled) Gait Pattern/deviations: Step-through pattern;Ataxic;Staggering left;Staggering right;Wide base of support Gait velocity: vai   General Gait  Details: con't to be moderately ataxic requiring assist to steady pt during amb without RW. Pt with improved stability with RW however con't to require minA for safe walker management. pt with significant WBing through UEs, v/c's to stay in walker.    Stairs Stairs: Yes Stairs assistance: Mod assist Stair Management: No rails Number of Stairs: 3 General stair comments: pt extremely unsteady due to not using a rail however this is his home set up  Wheelchair Mobility    Modified Rankin (Stroke Patients Only)       Balance Overall balance assessment: Needs assistance Sitting-balance support: No upper extremity supported;Feet supported Sitting balance-Leahy Scale: Good     Standing balance support: No upper extremity supported Standing balance-Leahy Scale: Poor                 High Level Balance Comments: worked on tandem walking however pt required moA to maintain balance. completed 3 bouts of 15 feet. attempted to work on stepping over "squares" however pt became increasingly ataxic with c/o "i don't feel weel" Pt required seated rest break.    Cognition Arousal/Alertness: Awake/alert Behavior During Therapy: WFL for tasks assessed/performed Overall Cognitive Status: Within Functional Limits for tasks assessed                      Exercises      General Comments        Pertinent Vitals/Pain Pain Assessment: No/denies pain    Home Living                      Prior Function  PT Goals (current goals can now be found in the care plan section) Acute Rehab PT Goals Patient Stated Goal: none Progress towards PT goals: Progressing toward goals    Frequency  Min 4X/week    PT Plan Current plan remains appropriate    Co-evaluation             End of Session Equipment Utilized During Treatment: Gait belt Activity Tolerance: Patient tolerated treatment well Patient left: in bed;with call bell/phone within reach;with bed alarm  set     Time: 6004-5997 PT Time Calculation (min) (ACUTE ONLY): 33 min  Charges:  $Gait Training: 8-22 mins $Neuromuscular Re-education: 8-22 mins                    G Codes:      Kingsley Callander 08/02/2015, 10:02 AM   Kittie Plater, PT, DPT Pager #: [phone] Office #: [phone]

## 2015-08-02 NOTE — Telephone Encounter (Signed)
Dr.Fitzgerald from [facility] called and would like to speak with Dr. [name] about the pt seizure medication . Please call her back at [phone].

## 2015-08-02 NOTE — Progress Notes (Signed)
OT NOTE- LATE GCODE ENTRY   08/01/15 1400  OT G-codes **NOT FOR INPATIENT CLASS**  Functional Assessment Tool Used clinical judgement  Functional Limitation Self care  Self Care Current Status [phone]) CJ  Self Care Goal Status (U8828) [place]  Self Care Discharge Status [phone]) Elsie Stain   OTR/L Pager: [phone] Office: [phone] .

## 2015-08-02 NOTE — Telephone Encounter (Signed)
I called Dr. Ola Spurr. The patient was admitted for Dilantin toxicity. In June 2016, the patient had a low therapeutic level of Dilantin, he was increased from 200 mg of Dilantin daily to a 300 mg daily dose. The levels have gone too high. I would recommend placing him on 250 mg daily, we will need follow-up in 2-3 weeks. We will check a Dilantin level at that time.

## 2015-08-02 NOTE — Telephone Encounter (Signed)
Attempted to reach patient by phone to discuss dosing of his phenytoin, as upon further review we need to adjust his phenytoin to a different regimen than was prescribed upon discharge.  He was discharged with instructions to take dilantin 50mg  tabs: two tabs (100mg ) in morning, and 3 tabs (150mg ) at night.  He should instead be instructed to take dilantin 100mg  capsule two tabs (200mg ) at night, PLUS just one 50mg  tablet at night, with no doses in the morning.   No answer on his mobile number, but left voicemail asking him to return call as soon as he is able.  Please page me at [phone] when he returns the call.  Leeanne Rio, MD

## 2015-08-02 NOTE — Progress Notes (Signed)
Patient being discharged home with home health discharge summary verified and IV removed patient is being transported home by [name] his girl friend.

## 2015-08-02 NOTE — Care Management Note (Signed)
Case Management Note  Patient Details  Name: [name] MRN: [id number] Date of Birth: 1956-11-21  Subjective/Objective:                    Action/Plan:  Spoke with Dr Lincoln Brigham regarding discharge planning.  Patient is not eligible for [facility] therapies through Medicaid, as he has no qualifying diagnosis.  Patient IS eligible for a [facility] for disease/medication management.  Dr is aware and is agreeable.  Orders to be placed by MD.   CM met with patient, who is agreeable to home health services and has chosen [facility]. Miranda with [facility] was notified and has accepted the referral.  Patient's demographics were verified. The home number listed belongs to patient's girlfriend, [name]. Patient is fine with either number being used to contact him for appointments. Expected Discharge Date:                  Expected Discharge Plan:  [place]  In-House Referral:     Discharge planning Services  CM Consult  Post Acute Care Choice:  Home Health Choice offered to:  Patient  DME Arranged:    DME Agency:     HH Arranged:  RN [place] Agency:  [place]  Status of Service:  Completed, signed off  Medicare Important Message Given:    Date Medicare IM Given:    Medicare IM give by:    Date Additional Medicare IM Given:    Additional Medicare Important Message give by:     If discussed at [place] of Stay Meetings, dates discussed:    Additional Comments:  Rolm Baptise, RN 08/02/2015, 1:13 PM

## 2015-08-02 NOTE — Discharge Instructions (Signed)
You were hospitalized for vertigo and slurred speech and found to have too high levels of phenytoin (dilantin).  We stopped this in the hospital.  After talking to your outpatient neurologist, we will restart Dilantin 100mg  every morning and 150mg  every night at bedtime.  Do not start taking this until Thursday (8/18) morning.  If you have a recurrence of your symptoms, please come back to the hospital.  Your neurologist should call you for an appointment.  If they do not call this week, please call them to schedule an appointment.  You have an appointment at the [facility] on 8/24 at 9:45am.  It is important to keep this appointment.

## 2015-08-02 NOTE — Progress Notes (Signed)
Family Medicine Teaching Service Daily Progress Note Intern Pager: [phone]  Patient name: [name] Medical record number: [id number] Date of birth: [birth year] Age: 59 y.o. Gender: male  Primary Care Provider: Chrisandra Netters, MD Consultants: Neurology Code Status: DNR  Pt Overview and Major Events to Date:  - 07/29/15 CT head negative  - 07/29/15 MRA/MRI without arterial occlusion or significant stenosis - 8/13 - Symptoms likely polypharmacy. Toxic Phenytoin level @ 32.2 (holding until down to 20) - 8/14 - Phenytoin held, normally 300mg -ER daily - 8/14 - Pending Amitriptyline Level, (unable to obtain lacosamide level). Neurology signed off - 8/15 - Phenytoin level @ 27.9  Assessment and Plan: [name] is a 59 y.o. male presenting with slurred speech suspicious for TIA. PMH is significant for seizure disorder, severe schizoaffective disorder, depression, anxiety, HTN, HLD. Phenytoin level elevated in toxic range at 32.2 (albumin corrected to 37.4), as likely etiology. Otherwise, work-up for TIA/CVA negative with MRI and MRA unremarkable for acute changes, with known chronic changes. Will complete stroke work-up and obtain EEG due to episode of staring off 07/30/15. Nystagmus and hand flapping resolved.   # Slurred speech, likely in setting of polypharmacy with toxic Phenytoin level - Resolved. Phenytoin level still elevated at 27.9.  Complex PMH with neuro/psych disorders including seizure disorder, schizoaffective disorder, on multiple AEDs and anti-psychotic meds at baseline, all of which can be sedating and cause complications with polypharmacy.  - Neurology consulted, suspect toxic phenytoin is etiology of symptoms. Signed off 07/31/15. Follow-up outpt. - Contacted patient's outpatient neurologist, Dr. [name], who recommended restarting dilantin at 250 mg in a couple of days. He will arrange follow-up for patient in 1-2 weeks. (Prescribe as two 100 mg capsules and a 50 mg  chewable.) - Continue ASA - Does not qualify for PT/OT home health services. Will request [facility] nurse to aid with medication management. Patient requesting print-outs of exercises to increase LE strength.  - Amitriptyline level still in process  # Hypertension, elevated overnight to systolic in [id number] - Restart home metoprolol 50 mg XR upon discharge.   # Seizure disorder, controlled: Concern for episode 07/30/15 due to staring spell.  No evidence of active / breakthrough seizure. - EEG today 08/01/15 - Continue to hold phenytoin for elevated level of 27.9 - continue lacosamide 150mg  BID   # Androgen deficiency - Patient received testosterone shot 08/01/15.   # Vertigo, improved - PT vestibular eval  - meclizine 25mg  TID PRN   # Schizoaffective disorder/depression/anxiety: was recently at behavorial health for SI end of July, has appt for f/u scheduled on 8/18. No current SI.  - continue seroquel 600mg  qhs  - continue amitriptyline 300mg  qhs (following up amitriptyline level) - klonopin 2mg  TID PRN   # HLD:  lipid panel WNL - Continue atorvastatin   # Constipation: Pt had bowel movement 8/15 after enema.   FEN/GI: reg diet, SLIV Prophylaxis: lovenox sq  Disposition: Admitted for work-up possible CVA, since negative work-up with MRI / MRA, Neurology signed off, after likely etiology is polypharmacy and toxic phenytoin level. Continue to hold dilantin. To restart in 2 days at 250 mg. CM states patient does not qualify for [facility] PT/OT due to admission diagnoses.   Subjective:  Slurred speech resolved. Patient feels he did well working with PT and did not have trouble with his vertigo.   Objective: Temp:  [97.8 F (36.6 C)-99.2 F (37.3 C)] 97.8 F (36.6 C) (08/16 0620) Pulse Rate:  [73-92] 73 (08/16 0620) Resp:  [  17-20] 18 (08/16 0620) BP: (101-144)/(59-79) 118/69 mmHg (08/16 0620) SpO2:  [95 %-98 %] 95 % (08/16 0620) Physical Exam: General: Resting in bed, comfortable, NAD   Eyes: PERRL, EOMI Cardiovascular: RRR, no murmurs, rubs or gallops. Respiratory: CTAB, good air movement Abdomen: soft, NTND, +BS Neuro: AOx3. 5/5 strength in all extremities. CN II-XII intact. No nystagmus or hand flapping noted. MOCA performed and scored 19 (difficulty with word recall, cube draw, placement of hands on clock and serial subtractions)  Laboratory:  Recent Labs Lab 07/29/15 1526 07/29/15 1538 08/01/15 0328 08/02/15 0648  WBC 7.4  --  7.1 7.7  HGB 15.3 16.0 15.6 15.6  HCT 43.8 47.0 45.3 45.2  PLT 195  --  195 188    Recent Labs Lab 07/29/15 1526  07/30/15 1449 08/01/15 0328 08/02/15 0648  NA 138  < > 137 138 135  K 4.2  < > 4.4 3.8 4.1  CL 105  < > 103 103 102  CO2 24  --  22 26 24   BUN 8  < > 8 9 9   CREATININE 1.06  < > 0.91 0.89 0.91  CALCIUM 8.7*  --  8.3* 9.0 8.8*  PROT 6.4*  --   --   --   --   BILITOT 0.7  --   --   --   --   ALKPHOS 82  --   --   --   --   ALT 16*  --   --   --   --   AST 25  --   --   --   --   GLUCOSE 133*  < > 96 99 97  < > = values in this interval not displayed.  Imaging/Diagnostic Tests:  - 07/30/15 ECHO - normal EF 60-65%, no evidence of thrombus  Ct Head Wo Contrast  07/29/2015   CLINICAL DATA:  Code stroke.  Sudden onset of slurred speech.  EXAM: CT HEAD WITHOUT CONTRAST  TECHNIQUE: Contiguous axial images were obtained from the base of the skull through the vertex without intravenous contrast.  COMPARISON:  MRI brain 07/25/2015 and CT scan 05/21/2014  FINDINGS: The ventricles are normal in size and configuration. No extra-axial fluid collections are identified. The gray-white differentiation is normal. No CT findings for acute intracranial process such as hemorrhage or infarction. No mass lesions. The brainstem and cerebellum are grossly normal.  The bony structures are intact. The paranasal sinuses and mastoid air cells are clear. The globes are intact.  IMPRESSION: No acute intracranial findings.  No change since prior  examination.   Electronically Signed   By: Marijo Sanes M.D.   On: 07/29/2015 15:28   Ct Head Wo Contrast  07/25/2015   CLINICAL DATA:  Acute onset of dizziness, nausea and vertigo. Initial encounter.  EXAM: CT HEAD WITHOUT CONTRAST  TECHNIQUE: Contiguous axial images were obtained from the base of the skull through the vertex without intravenous contrast.  COMPARISON:  CT of the head performed 06/01/2015  FINDINGS: There is no evidence of acute infarction, mass lesion, or intra- or extra-axial hemorrhage on CT.  Prominence of the sulci suggest mild cortical volume loss. Mild cerebellar atrophy is noted. Periventricular white matter change likely reflects small vessel ischemic microangiopathy.  The brainstem and fourth ventricle are within normal limits. The basal ganglia are unremarkable in appearance. The cerebral hemispheres demonstrate grossly normal gray-white differentiation. No mass effect or midline shift is seen.  There is no evidence of fracture; visualized osseous structures  are unremarkable in appearance. The orbits are within normal limits. The paranasal sinuses and mastoid air cells are well-aerated. No significant soft tissue abnormalities are seen.  IMPRESSION: 1. No acute intracranial pathology seen on CT. 2. Mild cortical volume loss and scattered small vessel ischemic microangiopathy.   Electronically Signed   By: Garald Balding M.D.   On: 07/25/2015 22:20   Mr Brain Wo Contrast  07/29/2015   CLINICAL DATA:  TIA. Acute onset slurred speech and confusion earlier today. Vertigo for 5 days. History of seizure disorder.  EXAM: MRI HEAD WITHOUT CONTRAST  MRA HEAD WITHOUT CONTRAST  TECHNIQUE: Multiplanar, multiecho pulse sequences of the brain and surrounding structures were obtained without intravenous contrast. Angiographic images of the head were obtained using MRA technique without contrast.  COMPARISON:  Head CT 07/29/2015.  Head MRI 07/25/2015.  FINDINGS: MRI HEAD FINDINGS  Dedicated thin  section imaging through the temporal lobes demonstrates normal volume and signal of the hippocampi. There is no evidence of acute infarct, intracranial hemorrhage, mass, midline shift, or extra-axial fluid collection. There is mild generalized cerebral atrophy. Scattered, small foci of T2 hyperintensity in the cerebral white matter are unchanged from the recent prior MRI and nonspecific but compatible with minimal chronic small vessel ischemic disease.  Orbits are unremarkable. Paranasal sinuses and mastoid air cells are clear. Major intracranial vascular flow voids are preserved.  MRA HEAD FINDINGS  The visualized distal vertebral arteries are patent with the right being dominant. Right PICA origin is patent. Left AICA is patent and dominant. SCA origins are patent, although the left SCA is small and not well evaluated. Basilar artery is patent without stenosis. There are fetal type origins of both PCAs. No significant PCA stenosis is seen.  Internal carotid arteries are patent from skullbase to carotid termini. ACAs and MCAs are unremarkable. No intracranial aneurysm is identified.  IMPRESSION: 1. Unchanged appearance of the brain without evidence of acute intracranial abnormality. 2. No major intracranial arterial occlusion or significant stenosis.   Electronically Signed   By: Logan Bores   On: 07/29/2015 21:40   Mr Brain Wo Contrast  07/26/2015   CLINICAL DATA:  Initial evaluation for acute nausea, dizziness for several days.  EXAM: MRI HEAD WITHOUT CONTRAST  TECHNIQUE: Multiplanar, multiecho pulse sequences of the brain and surrounding structures were obtained without intravenous contrast.  COMPARISON:  Prior CT from earlier the same day.  FINDINGS: Mild diffuse prominence of the CSF containing spaces is compatible with generalized age-related cerebral atrophy. Mild T2/FLAIR hyperintense foci within the periventricular white matter, nonspecific, but most likely related to very minimal chronic small vessel  ischemic disease.  No abnormal foci of restricted diffusion to suggest acute intracranial infarct. Gray-white matter differentiation maintained. Normal intravascular flow voids are preserved. No acute or chronic intracranial hemorrhage.  No mass lesion, midline shift, or mass effect. No hydrocephalus. No extra-axial fluid collection.  Craniocervical junction within normal limits. Pituitary gland normal.  No acute abnormality about the orbits. Mild opacity present within the right ethmoidal air cells. Paranasal sinuses are otherwise largely clear. No mastoid effusion. Inner ear structures within normal limits.  Bone marrow signal intensity normal. Scalp soft tissues unremarkable.  IMPRESSION: 1. No acute intracranial infarct or other abnormality identified. 2. Mild age-related cerebral atrophy with chronic microvascular ischemic disease.   Electronically Signed   By: Jeannine Boga M.D.   On: 07/26/2015 01:24   Mr Jodene Nam Head/brain Wo Cm  07/29/2015   CLINICAL DATA:  TIA. Acute onset slurred  speech and confusion earlier today. Vertigo for 5 days. History of seizure disorder.  EXAM: MRI HEAD WITHOUT CONTRAST  MRA HEAD WITHOUT CONTRAST  TECHNIQUE: Multiplanar, multiecho pulse sequences of the brain and surrounding structures were obtained without intravenous contrast. Angiographic images of the head were obtained using MRA technique without contrast.  COMPARISON:  Head CT 07/29/2015.  Head MRI 07/25/2015.  FINDINGS: MRI HEAD FINDINGS  Dedicated thin section imaging through the temporal lobes demonstrates normal volume and signal of the hippocampi. There is no evidence of acute infarct, intracranial hemorrhage, mass, midline shift, or extra-axial fluid collection. There is mild generalized cerebral atrophy. Scattered, small foci of T2 hyperintensity in the cerebral white matter are unchanged from the recent prior MRI and nonspecific but compatible with minimal chronic small vessel ischemic disease.  Orbits are  unremarkable. Paranasal sinuses and mastoid air cells are clear. Major intracranial vascular flow voids are preserved.  MRA HEAD FINDINGS  The visualized distal vertebral arteries are patent with the right being dominant. Right PICA origin is patent. Left AICA is patent and dominant. SCA origins are patent, although the left SCA is small and not well evaluated. Basilar artery is patent without stenosis. There are fetal type origins of both PCAs. No significant PCA stenosis is seen.  Internal carotid arteries are patent from skullbase to carotid termini. ACAs and MCAs are unremarkable. No intracranial aneurysm is identified.  IMPRESSION: 1. Unchanged appearance of the brain without evidence of acute intracranial abnormality. 2. No major intracranial arterial occlusion or significant stenosis.   Electronically Signed   By: Logan Bores   On: 07/29/2015 21:40     Aisley Whan Corinda Gubler, MD 08/02/2015, 7:54 AM PGY-1, [place] Intern pager: [phone], text pages welcome

## 2015-08-03 LAB — AMITRIPTYLINE LEVEL
Amitrip+Nortrip: 281 mcg/L — ABNORMAL HIGH (ref 100–250)
Amitriptyline: 136 mcg/L
Nortriptyline-AMITR: 145 mcg/L

## 2015-08-03 MED ORDER — PHENYTOIN 50 MG PO CHEW: CHEWABLE_TABLET | ORAL | Status: DC

## 2015-08-03 MED ORDER — PHENYTOIN SODIUM EXTENDED 100 MG PO CAPS: ORAL_CAPSULE | ORAL | Status: DC

## 2015-08-03 NOTE — Telephone Encounter (Signed)
Called patient and reached him & delivered message below about his dilantin dosing.  Also discussed newly resulted mildly elevated amitriptyline level. Recommend he discuss this when he sees new psychiatrist tomorrow. Reviewed with him his f/u appointments.  He was able to repeat back to me the new correct dosing of his dilantin  Leeanne Rio, MD

## 2015-08-03 NOTE — Telephone Encounter (Signed)
I called the patient. Appointment scheduled with [facility] 8/30.

## 2015-08-04 ENCOUNTER — Ambulatory Visit: Payer: Medicaid Other | Admitting: Nurse Practitioner

## 2015-08-05 ENCOUNTER — Ambulatory Visit (HOSPITAL_COMMUNITY): Payer: Self-pay | Admitting: Psychiatry

## 2015-08-08 ENCOUNTER — Inpatient Hospital Stay: Payer: Self-pay | Admitting: Family Medicine

## 2015-08-10 ENCOUNTER — Inpatient Hospital Stay: Payer: Self-pay | Admitting: Family Medicine

## 2015-08-10 ENCOUNTER — Inpatient Hospital Stay: Payer: Self-pay | Admitting: Internal Medicine

## 2015-08-11 ENCOUNTER — Encounter (HOSPITAL_COMMUNITY): Payer: Self-pay | Admitting: Psychiatry

## 2015-08-11 ENCOUNTER — Encounter (INDEPENDENT_AMBULATORY_CARE_PROVIDER_SITE_OTHER): Payer: Self-pay

## 2015-08-11 ENCOUNTER — Ambulatory Visit (INDEPENDENT_AMBULATORY_CARE_PROVIDER_SITE_OTHER): Payer: Medicaid Other | Admitting: Psychiatry

## 2015-08-11 VITALS — BP 133/84 | HR 82 | Ht 71.0 in | Wt 192.4 lb

## 2015-08-11 DIAGNOSIS — F251 Schizoaffective disorder, depressive type: Secondary | ICD-10-CM

## 2015-08-11 MED ORDER — AMITRIPTYLINE HCL 150 MG PO TABS: 150.0000 mg | ORAL_TABLET | Freq: Every day | ORAL | Status: DC

## 2015-08-11 MED ORDER — QUETIAPINE FUMARATE 300 MG PO TABS: 600.0000 mg | ORAL_TABLET | Freq: Every day | ORAL | Status: DC

## 2015-08-11 NOTE — Progress Notes (Signed)
[facility]al Health Initial Assessment Note  [name] [id number] 59 y.o.  08/11/2015 12:18 PM  Chief Complaint:  I was release from the hospital recently.  I need medication for my depression.  History of Present Illness:  Patient is 59 year old Caucasian, unemployed single man who is referred from inpatient psychiatric services for the management of his depression.  Patient was admitted to behavioral [place] on July 27 and discharged on August 4.  He was admitted due to severe depression and having suicidal thoughts and plan to asphyxiate himself with the helium.  He had purchase helium tanks, face Mac and tubing.  He told that he was very depressed because he was afraid that he will be homeless.  He had paranoid ideation that his brother is stealing his belongings through the lawyer.  Patient told his brother  Lives in [place] and he reported this country has very high rate of crime. Patient has not seen or talked to his brother in 7 years since his father deceased.  Patient lives in a house which is under a trust made by his father.  Patient does not trust his brother who he reported has been involved in cheating him in the past.  Patient told that his depression was so bad that he was feeling withdrawn, isolated, staying to himself and not leaving the house.  He admitted that he was also very sedated due to medication.  He has no energy to even go to the bathroom.  He was peeing in the bottle and one time he ingested urine because he felt it was water. He was taking Seroquel 900 mg, Klonopin 2 mg 4 times and amitriptyline 400 mg at bedtime.  During hospitalization he developed more confusion and his medications were reduced.  He is taking Seroquel 600 mg at bedtime, Klonopin 2 mg twice a day as needed and amitriptyline 150 mg at bedtime.  Soon after discharge from the hospital patient require admission on the medical floor due to Dilantin toxicity.  His Dilantin level was 32.  Patient  left the hospital last week and her dose has been reduced.  Patient overall feels much better because he is less sedated and more alert.  He still have chronic depression but denies any feeling of hopelessness or worthlessness.  He endorse memory impairment, fatigue, however denies any feeling of hopelessness or worthlessness.  He has no tremors or shakes.  He denies any suicidal thoughts.  He still have paranoia and negative thinking and believes that his brother is trying to harm him however he is able to distract those thinking.  Patient is very well aware about his illness, medication and long-term prognosis.  There has some discussion about ECT and [place] treatment. Patient has been eating about [place] like to get further consideration if needed in the future.  He's not interested in ECT due to memory impairment.  Patient was seeing [name] at [facility] however recently [name] moved out and he wanted to establish care in this office.  Currently patient is not seeing any therapist.  Patient denies any mania, hallucination, aggressive behavior, PTSD symptoms, OCD symptoms or any panic attacks.  His sleep is better.  He is tolerating his medication and he like to cut down further Klonopin in the future as he realized that he was taking too much Klonopin.  Suicidal Ideation: No Plan Formed: No Patient has means to carry out plan: No  Homicidal Ideation: No Plan Formed: No Patient has means to  carry out plan: No  Past Psychiatric History/Hospitalization(s): Patient endorse severe depression since his college.  He's been taking medication since 1984.  He has at least 15 psychiatric hospitalization due to severe depression, paranoia and suicidal thoughts.  He had one history of suicidal attempt when he cut his testicle because he does not want to be depressed.  He admitted history of paranoia and negative thinking.  He was seeing at [name] at [place] however it closed he moved  to [name] to continue with [name].  He had tried numerous SSRIs however he always had opposite reaction with the SSRI.  He felt poor sleep, restlessness, irritability and unable to tolerate SSRIs.  In the past she had tried nortriptyline, Haldol, Geodon, Risperdal, lithium, Prozac, Paxil, Lexapro and Zoloft.  He denies any history of mania.  Patient denies any history of drugs or alcohol use. Anxiety: No Bipolar Disorder: No Depression: Yes Mania: No Psychosis: Paranoia and negative thinking Schizophrenia: No Personality Disorder: No Hospitalization for psychiatric illness: No History of Electroconvulsive Shock Therapy: No Prior Suicide Attempts: Yes  Medical History; Patient has hypertension, GERD, hyperlipidemia, mild memory impairment, seizure disorder.  His primary care physician is [facility]. Patient also goes to [facility] for seizures management.  Traumatic brain injury: Patient denies any history of traumatic brain injury.  Family History; Patient endorse uncle had severe depression and committed suicide by killing himself  Education and Work History; Patient was in the college when he had severe depression and he has to leave the college.  He is on disability.  Psychosocial History; Patient born and raised in [place].  He never married.  He has no children.  He has a good friend who he know for 15 years and very supportive.  His parents are deceased.  His father died 3 years ago.  He has one brother who live in [place].  Legal History; Patient denies any legal issues.  History Of Abuse; Patient denies any history of abuse.  Substance Abuse History; Patient denies any current use of drinking or any illegal substance use.  Review of Systems: Psychiatric: Agitation: No Hallucination: No Depressed Mood: No Insomnia: No Hypersomnia: No Altered Concentration: No Feels Worthless: No Grandiose Ideas: No Belief In Special Powers:  No New/Increased Substance Abuse: No Compulsions: No  Neurologic: Headache: No Seizure: History of seizures Paresthesias: No   Outpatient Encounter Prescriptions as of 08/11/2015  Medication Sig  . clonazePAM (KLONOPIN) 1 MG tablet Take 1 mg by mouth 2 (two) times daily.  Marland Kitchen amitriptyline (ELAVIL) 150 MG tablet Take 1 tablet (150 mg total) by mouth at bedtime.  Marland Kitchen aspirin EC 81 MG tablet Take 1 tablet (81 mg total) by mouth daily. (Patient not taking: Reported on 07/29/2015)  . clonazePAM (KLONOPIN) 2 MG tablet Take 1 tablet (2 mg total) by mouth 3 (three) times daily. (Patient taking differently: Take 2 mg by mouth See admin instructions. Take 1 tablet (2 mg) every night at bedtime, may take an additional tablet during the day if needed for anxiety)  . Lacosamide (VIMPAT) 150 MG TABS Take 1 tablet (150 mg total) by mouth 2 (two) times daily.  Marland Kitchen loratadine (CLARITIN) 10 MG tablet Take 1 tablet (10 mg total) by mouth daily.  . meclizine (ANTIVERT) 25 MG tablet Take 25 mg by mouth 3 (three) times daily.   . meclizine (ANTIVERT) 50 MG tablet Take 0.5 tablets (25 mg total) by mouth 3 (three) times daily as needed  for dizziness. (Patient not taking: Reported on 07/29/2015)  . metoprolol succinate (TOPROL-XL) 50 MG 24 hr tablet TAKE 1 TABLET (50 MG TOTAL) BY MOUTH AT BEDTIME. TAKE WITH OR IMMEDIATELY FOLLOWING A MEAL.  Marland Kitchen phenytoin (DILANTIN INFATABS) 50 MG tablet Take one 1m tab at night along with two 1027mcapsules for a total of 25054mt night.  . phenytoin (DILANTIN) 100 MG ER capsule Take 2 capsules at night, along with a 37m80mblet at night for a total of 237mg57mnight.  . polyethylene glycol (MIRALAX / GLYCOLAX) packet Take 17 g by mouth 2 (two) times daily as needed. For constipation. Mix into 8 ounces of fluid & drink (Patient taking differently: Take 17 g by mouth 2 (two) times daily as needed (constipation). . Mix into 8 ounces of fluid & drink)  . QUEtiapine (SEROQUEL) 300 MG tablet Take  2 tablets (600 mg total) by mouth at bedtime.  . senna-docusate (SENOKOT-S) 8.6-50 MG per tablet Take 1 tablet by mouth at bedtime. (Patient taking differently: Take 1 tablet by mouth at bedtime as needed (constipation). )  . simvastatin (ZOCOR) 40 MG tablet Take 1 tablet (40 mg total) by mouth at bedtime.  . tesMarland Kitchenosterone cypionate (DEPOTESTOSTERONE CYPIONATE) 200 MG/ML injection Inject 2 mLs (400 mg total) into the muscle every 14 (fourteen) days. (Patient taking differently: Inject 400 mg into the muscle every 14 (fourteen) days. Every other Thursday -next injection was due 07/27/15)  . [DISCONTINUED] amitriptyline (ELAVIL) 150 MG tablet Take 2 tablets (300 mg total) by mouth at bedtime.  . [DISCONTINUED] QUEtiapine (SEROQUEL) 300 MG tablet Take 2 tablets (600 mg total) by mouth at bedtime.   No facility-administered encounter medications on file as of 08/11/2015.    Recent Results (from the past 2160 hour(s))  POCT Skin KOH     Status: None   Collection Time: 05/17/15  2:00 PM  Result Value Ref Range   Skin KOH, POC Negative   Dilantin (phenytoin) Level   (if pt is taking this medication)     Status: None   Collection Time: 05/30/15  8:27 PM  Result Value Ref Range   Phenytoin Lvl 11.4 10.0 - 20.0 ug/mL  CBC with Differential/Platelet     Status: Abnormal   Collection Time: 05/30/15  8:27 PM  Result Value Ref Range   WBC 8.3 4.0 - 10.5 K/uL   RBC 4.46 4.22 - 5.81 MIL/uL   Hemoglobin 14.3 13.0 - 17.0 g/dL   HCT 41.3 39.0 - 52.0 %   MCV 92.6 78.0 - 100.0 fL   MCH 32.1 26.0 - 34.0 pg   MCHC 34.6 30.0 - 36.0 g/dL   RDW 14.3 11.5 - 15.5 %   Platelets 172 150 - 400 K/uL   Neutrophils Relative % 79 (H) 43 - 77 %   Neutro Abs 6.5 1.7 - 7.7 K/uL   Lymphocytes Relative 15 12 - 46 %   Lymphs Abs 1.2 0.7 - 4.0 K/uL   Monocytes Relative 5 3 - 12 %   Monocytes Absolute 0.4 0.1 - 1.0 K/uL   Eosinophils Relative 1 0 - 5 %   Eosinophils Absolute 0.1 0.0 - 0.7 K/uL   Basophils Relative 0 0 - 1  %   Basophils Absolute 0.0 0.0 - 0.1 K/uL  Basic metabolic panel     Status: Abnormal   Collection Time: 05/30/15  8:27 PM  Result Value Ref Range   Sodium 137 135 - 145 mmol/L   Potassium 3.7 3.5 -  5.1 mmol/L   Chloride 104 101 - 111 mmol/L   CO2 25 22 - 32 mmol/L   Glucose, Bld 132 (H) 65 - 99 mg/dL   BUN 14 6 - 20 mg/dL   Creatinine, Ser 0.98 0.61 - 1.24 mg/dL   Calcium 8.7 (L) 8.9 - 10.3 mg/dL   GFR calc non Af Amer >60 >60 mL/min   GFR calc Af Amer >60 >60 mL/min    Comment: (NOTE) The eGFR has been calculated using the CKD EPI equation. This calculation has not been validated in all clinical situations. eGFR's persistently <60 mL/min signify possible Chronic Kidney Disease.    Anion gap 8 5 - 15  CBG monitoring, ED     Status: Abnormal   Collection Time: 05/30/15  8:40 PM  Result Value Ref Range   Glucose-Capillary 134 (H) 65 - 99 mg/dL  Urinalysis, Routine w reflex microscopic (not at [facility])     Status: Abnormal   Collection Time: 06/01/15  5:05 PM  Result Value Ref Range   Color, Urine YELLOW YELLOW   APPearance CLOUDY (A) CLEAR   Specific Gravity, Urine 1.013 1.005 - 1.030   pH 6.5 5.0 - 8.0   Glucose, UA NEGATIVE NEGATIVE mg/dL   Hgb urine dipstick NEGATIVE NEGATIVE   Bilirubin Urine NEGATIVE NEGATIVE   Ketones, ur NEGATIVE NEGATIVE mg/dL   Protein, ur NEGATIVE NEGATIVE mg/dL   Urobilinogen, UA 0.2 0.0 - 1.0 mg/dL   Nitrite NEGATIVE NEGATIVE   Leukocytes, UA NEGATIVE NEGATIVE    Comment: MICROSCOPIC NOT DONE ON URINES WITH NEGATIVE PROTEIN, BLOOD, LEUKOCYTES, NITRITE, OR GLUCOSE <1000 mg/dL.  CBC with Differential/Platelet     Status: None (Preliminary result)   Collection Time: 06/01/15  5:37 PM  Result Value Ref Range   WBC SPECIMEN CLOTTED NOTIFIED RN @ 1800 4.0 - 10.5 K/uL   RBC SPECIMEN CLOTTED NOTIFIED RN @ 1800 4.22 - 5.81 MIL/uL   Hemoglobin SPECIMEN CLOTTED NOTIFIED RN @ 1800 13.0 - 17.0 g/dL   HCT SPECIMEN CLOTTED NOTIFIED RN @ 1800 39.0 - 52.0 %    MCV SPECIMEN CLOTTED NOTIFIED RN @ 1800 78.0 - 100.0 fL   MCH SPECIMEN CLOTTED NOTIFIED RN @ 1800 26.0 - 34.0 pg   MCHC SPECIMEN CLOTTED NOTIFIED RN @ 1800 30.0 - 36.0 g/dL   RDW SPECIMEN CLOTTED NOTIFIED RN @ 1800 11.5 - 15.5 %   Platelets SPECIMEN CLOTTED NOTIFIED RN @ 1800 150 - 400 K/uL   Neutrophils Relative % PENDING 43 - 77 %   Neutro Abs PENDING 1.7 - 7.7 K/uL   Band Neutrophils PENDING 0 - 10 %   Lymphocytes Relative PENDING 12 - 46 %   Lymphs Abs PENDING 0.7 - 4.0 K/uL   Monocytes Relative PENDING 3 - 12 %   Monocytes Absolute PENDING 0.1 - 1.0 K/uL   Eosinophils Relative PENDING 0 - 5 %   Eosinophils Absolute PENDING 0.0 - 0.7 K/uL   Basophils Relative PENDING 0 - 1 %   Basophils Absolute PENDING 0.0 - 0.1 K/uL   WBC Morphology PENDING    RBC Morphology PENDING    Smear Review PENDING    Other PENDING %   nRBC PENDING 0 /100 WBC   Metamyelocytes Relative PENDING %   Myelocytes PENDING %   Promyelocytes Absolute PENDING %   Blasts PENDING %  Comprehensive metabolic panel     Status: Abnormal   Collection Time: 06/01/15  5:37 PM  Result Value Ref Range   Sodium 138 135 -  145 mmol/L   Potassium 3.9 3.5 - 5.1 mmol/L   Chloride 104 101 - 111 mmol/L   CO2 24 22 - 32 mmol/L   Glucose, Bld 128 (H) 65 - 99 mg/dL   BUN 12 6 - 20 mg/dL   Creatinine, Ser 1.07 0.61 - 1.24 mg/dL   Calcium 8.9 8.9 - 10.3 mg/dL   Total Protein 7.3 6.5 - 8.1 g/dL   Albumin 4.3 3.5 - 5.0 g/dL   AST 22 15 - 41 U/L   ALT 21 17 - 63 U/L   Alkaline Phosphatase 95 38 - 126 U/L   Total Bilirubin 0.5 0.3 - 1.2 mg/dL   GFR calc non Af Amer >60 >60 mL/min   GFR calc Af Amer >60 >60 mL/min    Comment: (NOTE) The eGFR has been calculated using the CKD EPI equation. This calculation has not been validated in all clinical situations. eGFR's persistently <60 mL/min signify possible Chronic Kidney Disease.    Anion gap 10 5 - 15  Phenytoin level, total     Status: None   Collection Time: 06/01/15  5:37  PM  Result Value Ref Range   Phenytoin Lvl 12.6 10.0 - 20.0 ug/mL  I-stat troponin, ED     Status: None   Collection Time: 06/01/15  5:46 PM  Result Value Ref Range   Troponin i, poc 0.00 0.00 - 0.08 ng/mL   Comment 3            Comment: Due to the release kinetics of cTnI, a negative result within the first hours of the onset of symptoms does not rule out myocardial infarction with certainty. If myocardial infarction is still suspected, repeat the test at appropriate intervals.   CBC with Differential/Platelet     Status: Abnormal   Collection Time: 06/01/15  6:48 PM  Result Value Ref Range   WBC 9.6 4.0 - 10.5 K/uL   RBC 4.42 4.22 - 5.81 MIL/uL   Hemoglobin 14.0 13.0 - 17.0 g/dL   HCT 40.4 39.0 - 52.0 %   MCV 91.4 78.0 - 100.0 fL   MCH 31.7 26.0 - 34.0 pg   MCHC 34.7 30.0 - 36.0 g/dL   RDW 14.2 11.5 - 15.5 %   Platelets 158 150 - 400 K/uL   Neutrophils Relative % 81 (H) 43 - 77 %   Neutro Abs 7.7 1.7 - 7.7 K/uL   Lymphocytes Relative 13 12 - 46 %   Lymphs Abs 1.3 0.7 - 4.0 K/uL   Monocytes Relative 6 3 - 12 %   Monocytes Absolute 0.6 0.1 - 1.0 K/uL   Eosinophils Relative 0 0 - 5 %   Eosinophils Absolute 0.0 0.0 - 0.7 K/uL   Basophils Relative 0 0 - 1 %   Basophils Absolute 0.0 0.0 - 0.1 K/uL  MRSA PCR Screening     Status: None   Collection Time: 06/02/15  1:12 AM  Result Value Ref Range   MRSA by PCR NEGATIVE NEGATIVE    Comment:        The GeneXpert MRSA Assay (FDA approved for NASAL specimens only), is one component of a comprehensive MRSA colonization surveillance program. It is not intended to diagnose MRSA infection nor to guide or monitor treatment for MRSA infections.   Basic metabolic panel     Status: Abnormal   Collection Time: 06/02/15  5:54 AM  Result Value Ref Range   Sodium 141 135 - 145 mmol/L   Potassium 3.5 3.5 - 5.1  mmol/L   Chloride 107 101 - 111 mmol/L   CO2 26 22 - 32 mmol/L   Glucose, Bld 115 (H) 65 - 99 mg/dL   BUN 9 6 - 20 mg/dL    Creatinine, Ser 0.89 0.61 - 1.24 mg/dL   Calcium 8.7 (L) 8.9 - 10.3 mg/dL   GFR calc non Af Amer >60 >60 mL/min   GFR calc Af Amer >60 >60 mL/min    Comment: (NOTE) The eGFR has been calculated using the CKD EPI equation. This calculation has not been validated in all clinical situations. eGFR's persistently <60 mL/min signify possible Chronic Kidney Disease.    Anion gap 8 5 - 15  CBC     Status: None   Collection Time: 06/02/15  5:54 AM  Result Value Ref Range   WBC 6.1 4.0 - 10.5 K/uL   RBC 4.35 4.22 - 5.81 MIL/uL   Hemoglobin 13.8 13.0 - 17.0 g/dL   HCT 39.8 39.0 - 52.0 %   MCV 91.5 78.0 - 100.0 fL   MCH 31.7 26.0 - 34.0 pg   MCHC 34.7 30.0 - 36.0 g/dL   RDW 14.3 11.5 - 15.5 %   Platelets 152 150 - 400 K/uL  Phenytoin level, total     Status: None   Collection Time: 06/28/15  4:49 PM  Result Value Ref Range   Phenytoin (Dilantin), Serum 18.6 10.0 - 20.0 ug/mL    Comment:                                 Neonatal:                                 Therapeutic 6.0 - 14.0                                 Detection Limit =  0.8                           <0.8 Indicates None Detected   Comprehensive metabolic panel     Status: Abnormal   Collection Time: 07/12/15 10:28 PM  Result Value Ref Range   Sodium 139 135 - 145 mmol/L   Potassium 4.2 3.5 - 5.1 mmol/L   Chloride 106 101 - 111 mmol/L   CO2 25 22 - 32 mmol/L   Glucose, Bld 122 (H) 65 - 99 mg/dL   BUN 18 6 - 20 mg/dL   Creatinine, Ser 1.23 0.61 - 1.24 mg/dL   Calcium 8.8 (L) 8.9 - 10.3 mg/dL   Total Protein 7.5 6.5 - 8.1 g/dL   Albumin 4.3 3.5 - 5.0 g/dL   AST 33 15 - 41 U/L   ALT 23 17 - 63 U/L   Alkaline Phosphatase 86 38 - 126 U/L   Total Bilirubin 0.7 0.3 - 1.2 mg/dL   GFR calc non Af Amer >60 >60 mL/min   GFR calc Af Amer >60 >60 mL/min    Comment: (NOTE) The eGFR has been calculated using the CKD EPI equation. This calculation has not been validated in all clinical situations. eGFR's persistently <60 mL/min  signify possible Chronic Kidney Disease.    Anion gap 8 5 - 15  Ethanol (ETOH)     Status: None   Collection Time:  07/12/15 10:28 PM  Result Value Ref Range   Alcohol, Ethyl (B) <5 <5 mg/dL    Comment:        LOWEST DETECTABLE LIMIT FOR SERUM ALCOHOL IS 5 mg/dL FOR MEDICAL PURPOSES ONLY   Salicylate level     Status: None   Collection Time: 07/12/15 10:28 PM  Result Value Ref Range   Salicylate Lvl <7.4 2.8 - 30.0 mg/dL  Acetaminophen level     Status: Abnormal   Collection Time: 07/12/15 10:28 PM  Result Value Ref Range   Acetaminophen (Tylenol), Serum <10 (L) 10 - 30 ug/mL    Comment:        THERAPEUTIC CONCENTRATIONS VARY SIGNIFICANTLY. A RANGE OF 10-30 ug/mL MAY BE AN EFFECTIVE CONCENTRATION FOR MANY PATIENTS. HOWEVER, SOME ARE BEST TREATED AT CONCENTRATIONS OUTSIDE THIS RANGE. ACETAMINOPHEN CONCENTRATIONS >150 ug/mL AT 4 HOURS AFTER INGESTION AND >50 ug/mL AT 12 HOURS AFTER INGESTION ARE OFTEN ASSOCIATED WITH TOXIC REACTIONS.   CBC     Status: None   Collection Time: 07/12/15 10:28 PM  Result Value Ref Range   WBC 8.3 4.0 - 10.5 K/uL   RBC 4.52 4.22 - 5.81 MIL/uL   Hemoglobin 14.8 13.0 - 17.0 g/dL   HCT 42.5 39.0 - 52.0 %   MCV 94.0 78.0 - 100.0 fL   MCH 32.7 26.0 - 34.0 pg   MCHC 34.8 30.0 - 36.0 g/dL   RDW 14.5 11.5 - 15.5 %   Platelets 226 150 - 400 K/uL  Urine rapid drug screen (hosp performed) (Not at [facility])     Status: Abnormal   Collection Time: 07/13/15  2:08 AM  Result Value Ref Range   Opiates NONE DETECTED NONE DETECTED   Cocaine NONE DETECTED NONE DETECTED   Benzodiazepines POSITIVE (A) NONE DETECTED   Amphetamines NONE DETECTED NONE DETECTED   Tetrahydrocannabinol NONE DETECTED NONE DETECTED   Barbiturates NONE DETECTED NONE DETECTED    Comment:        DRUG SCREEN FOR MEDICAL PURPOSES ONLY.  IF CONFIRMATION IS NEEDED FOR ANY PURPOSE, NOTIFY LAB WITHIN 5 DAYS.        LOWEST DETECTABLE LIMITS FOR URINE DRUG SCREEN Drug Class       Cutoff  (ng/mL) Amphetamine      1000 Barbiturate      200 Benzodiazepine   128 Tricyclics       786 Opiates          300 Cocaine          300 THC              50   TSH     Status: None   Collection Time: 07/13/15  6:10 AM  Result Value Ref Range   TSH 2.644 0.350 - 4.500 uIU/mL    Comment: Performed at [facility]  Phenytoin level, free     Status: Abnormal   Collection Time: 07/13/15  6:10 AM  Result Value Ref Range   Phenytoin, Free 1.3 1.0 - 2.0 ug/mL    Comment: (NOTE)                                Detection Limit = 0.5 Performed At: [facility] [place], [place] [id number] Lindon Romp MD [id number]    Phenytoin, Total 20.4 (H) 10.0 - 20.0 ug/mL    Comment: (NOTE)  Neonatal:                                Therapeutic 6.0 - 14.0                                Detection Limit =  0.8                          <0.8 Indicates None Detected Patient drug level exceeds published reference range.  Evaluate clinically for signs of potential toxicity. Performed at [facility]   Prolactin     Status: None   Collection Time: 07/13/15  6:10 AM  Result Value Ref Range   Prolactin 12.0 4.0 - 15.2 ng/mL    Comment: (NOTE) Performed At: [facility] [place], [place] [id number] Lindon Romp MD [id number] Performed at [facility]   Testosterone     Status: Abnormal   Collection Time: 07/13/15  6:10 AM  Result Value Ref Range   Testosterone 49 (L) 348 - 1197 ng/dL   Comment, Testosterone Comment     Comment: (NOTE) Adult male reference interval is based on a population of lean males up to 61 years old. Performed At: [facility] [place], [place] [id number] Lindon Romp MD [id number] Performed at [facility]   Testosterone, free     Status: Abnormal   Collection Time: 07/13/15  6:10 AM   Result Value Ref Range   Testosterone, Free 1.9 (L) 6.6 - 18.1 pg/mL    Comment: (NOTE) Performed At: [facility] [place], [place] [id number] Lindon Romp MD [id number] Performed at [facility]   Testosterone, % free     Status: Abnormal   Collection Time: 07/13/15  6:10 AM  Result Value Ref Range   Testosterone-% Free 0.9 (L) 0.2 - 0.7 %    Comment: (NOTE) Percent free testosterone by equilibrium dialysis performed at [facility] laboratories. [employer] expected values applicable for percent free testosterone only. Reference Range: Adult Males: 1.5 - 3.2 Performed At: [facility] [place], [place] [id number] Pepkowitz Sheral Apley MD [id number] Performed at [facility]   Hemoglobin A1c     Status: Abnormal   Collection Time: 07/13/15  6:10 AM  Result Value Ref Range   Hgb A1c MFr Bld 5.9 (H) 4.8 - 5.6 %    Comment: (NOTE)         Pre-diabetes: 5.7 - 6.4         Diabetes: >6.4         Glycemic control for adults with diabetes: <7.0    Mean Plasma Glucose 123 mg/dL    Comment: (NOTE) Performed At: [facility] [place], [place] [id number] Lindon Romp MD [id number] Performed at [facility]   Lipid panel     Status: Abnormal   Collection Time: 07/13/15  6:10 AM  Result Value Ref Range   Cholesterol 175 0 - 200 mg/dL   Triglycerides 154 (H) <150 mg/dL   HDL 62 >40 mg/dL   Total CHOL/HDL Ratio 2.8 RATIO   VLDL 31 0 - 40 mg/dL   LDL Cholesterol 82 0 - 99 mg/dL    Comment:  Total Cholesterol/HDL:CHD Risk Coronary Heart Disease Risk Table                     Men   Women  1/2 Average Risk   3.4   3.3  Average Risk       5.0   4.4  2 X Average Risk   9.6   7.1  3 X Average Risk  23.4   11.0        Use the calculated Patient Ratio above and the CHD Risk Table to determine the patient's CHD Risk.        ATP III  CLASSIFICATION (LDL):  <100     mg/dL   Optimal  100-129  mg/dL   Near or Above                    Optimal  130-159  mg/dL   Borderline  160-189  mg/dL   High  >190     mg/dL   Very High Performed at [facility]   Sex hormone binding globulin     Status: None   Collection Time: 07/13/15  6:10 AM  Result Value Ref Range   Sex Hormone Binding 47.9 19.3 - 76.4 nmol/L    Comment: (NOTE) Performed At: [facility] [place], [place] [id number] Lindon Romp MD [id number] Performed at [facility]   Glucose, capillary     Status: None   Collection Time: 07/14/15  4:54 PM  Result Value Ref Range   Glucose-Capillary 99 65 - 99 mg/dL  CK     Status: None   Collection Time: 07/14/15  7:05 PM  Result Value Ref Range   Total CK 196 49 - 397 U/L    Comment: Performed at [facility]  Glucose, capillary     Status: None   Collection Time: 07/15/15 11:48 AM  Result Value Ref Range   Glucose-Capillary 93 65 - 99 mg/dL   Comment 1 Notify RN    Comment 2 Document in Chart   Glucose, capillary     Status: None   Collection Time: 07/15/15  4:51 PM  Result Value Ref Range   Glucose-Capillary 99 65 - 99 mg/dL  Glucose, capillary     Status: Abnormal   Collection Time: 07/16/15  6:03 AM  Result Value Ref Range   Glucose-Capillary 101 (H) 65 - 99 mg/dL   Comment 1 Notify RN   Glucose, capillary     Status: Abnormal   Collection Time: 07/16/15 11:57 AM  Result Value Ref Range   Glucose-Capillary 106 (H) 65 - 99 mg/dL  Glucose, capillary     Status: None   Collection Time: 07/16/15  4:52 PM  Result Value Ref Range   Glucose-Capillary 88 65 - 99 mg/dL  Comprehensive metabolic panel     Status: Abnormal   Collection Time: 07/16/15  7:20 PM  Result Value Ref Range   Sodium 140 135 - 145 mmol/L   Potassium 4.3 3.5 - 5.1 mmol/L   Chloride 106 101 - 111 mmol/L   CO2 26 22 - 32 mmol/L   Glucose, Bld 91 65 - 99 mg/dL    BUN 17 6 - 20 mg/dL   Creatinine, Ser 1.08 0.61 - 1.24 mg/dL   Calcium 8.8 (L) 8.9 - 10.3 mg/dL   Total Protein 6.9 6.5 - 8.1 g/dL   Albumin 3.8 3.5 - 5.0 g/dL   AST 17 15 -  41 U/L   ALT 18 17 - 63 U/L   Alkaline Phosphatase 88 38 - 126 U/L   Total Bilirubin 0.3 0.3 - 1.2 mg/dL   GFR calc non Af Amer >60 >60 mL/min   GFR calc Af Amer >60 >60 mL/min    Comment: (NOTE) The eGFR has been calculated using the CKD EPI equation. This calculation has not been validated in all clinical situations. eGFR's persistently <60 mL/min signify possible Chronic Kidney Disease.    Anion gap 8 5 - 15    Comment: Performed at [facility]  Amitriptyline level     Status: None   Collection Time: 07/16/15  7:20 PM  Result Value Ref Range   Amitriptyline 148 mcg/L   Nortriptyline-AMITR 102 mcg/L   Amitrip+Nortrip 250 100 - 250 mcg/L    Comment: Performed at [facility]  Glucose, capillary     Status: None   Collection Time: 07/17/15  6:07 AM  Result Value Ref Range   Glucose-Capillary 95 65 - 99 mg/dL  Glucose, capillary     Status: None   Collection Time: 07/17/15 11:59 AM  Result Value Ref Range   Glucose-Capillary 96 65 - 99 mg/dL   Comment 1 Notify RN   Glucose, capillary     Status: Abnormal   Collection Time: 07/17/15  7:00 PM  Result Value Ref Range   Glucose-Capillary 120 (H) 65 - 99 mg/dL  Glucose, capillary     Status: Abnormal   Collection Time: 07/18/15  7:41 AM  Result Value Ref Range   Glucose-Capillary 101 (H) 65 - 99 mg/dL  Glucose, capillary     Status: None   Collection Time: 07/18/15  4:58 PM  Result Value Ref Range   Glucose-Capillary 96 65 - 99 mg/dL   Comment 1 Notify RN    Comment 2 Document in Chart   Glucose, capillary     Status: Abnormal   Collection Time: 07/19/15  6:06 AM  Result Value Ref Range   Glucose-Capillary 107 (H) 65 - 99 mg/dL   Comment 1 Notify RN   Glucose, capillary     Status: Abnormal   Collection Time: 07/19/15   4:55 PM  Result Value Ref Range   Glucose-Capillary 121 (H) 65 - 99 mg/dL  Glucose, capillary     Status: Abnormal   Collection Time: 07/20/15  6:01 AM  Result Value Ref Range   Glucose-Capillary 110 (H) 65 - 99 mg/dL   Comment 1 Notify RN   Glucose, capillary     Status: Abnormal   Collection Time: 07/20/15  5:09 PM  Result Value Ref Range   Glucose-Capillary 144 (H) 65 - 99 mg/dL  Glucose, capillary     Status: None   Collection Time: 07/21/15  6:35 AM  Result Value Ref Range   Glucose-Capillary 98 65 - 99 mg/dL  Glucose, capillary     Status: Abnormal   Collection Time: 07/21/15 11:40 AM  Result Value Ref Range   Glucose-Capillary 123 (H) 65 - 99 mg/dL  Comprehensive metabolic panel     Status: Abnormal   Collection Time: 07/25/15  9:43 PM  Result Value Ref Range   Sodium 136 135 - 145 mmol/L   Potassium 4.0 3.5 - 5.1 mmol/L   Chloride 104 101 - 111 mmol/L   CO2 22 22 - 32 mmol/L   Glucose, Bld 114 (H) 65 - 99 mg/dL   BUN 8 6 - 20 mg/dL   Creatinine, Ser 0.85 0.61 -  1.24 mg/dL   Calcium 8.9 8.9 - 10.3 mg/dL   Total Protein 6.5 6.5 - 8.1 g/dL   Albumin 3.9 3.5 - 5.0 g/dL   AST 18 15 - 41 U/L   ALT 14 (L) 17 - 63 U/L   Alkaline Phosphatase 85 38 - 126 U/L   Total Bilirubin 0.2 (L) 0.3 - 1.2 mg/dL   GFR calc non Af Amer >60 >60 mL/min   GFR calc Af Amer >60 >60 mL/min    Comment: (NOTE) The eGFR has been calculated using the CKD EPI equation. This calculation has not been validated in all clinical situations. eGFR's persistently <60 mL/min signify possible Chronic Kidney Disease.    Anion gap 10 5 - 15  I-Stat Chem 8, ED  (not at [facility], [facility])     Status: Abnormal   Collection Time: 07/25/15 10:08 PM  Result Value Ref Range   Sodium 138 135 - 145 mmol/L   Potassium 4.0 3.5 - 5.1 mmol/L   Chloride 102 101 - 111 mmol/L   BUN 10 6 - 20 mg/dL   Creatinine, Ser 0.80 0.61 - 1.24 mg/dL   Glucose, Bld 107 (H) 65 - 99 mg/dL   Calcium, Ion 1.09 (L) 1.13 - 1.30 mmol/L   TCO2  25 0 - 100 mmol/L   Hemoglobin 15.6 13.0 - 17.0 g/dL   HCT 46.0 39.0 - 52.0 %  I-stat troponin, ED     Status: None   Collection Time: 07/25/15 10:10 PM  Result Value Ref Range   Troponin i, poc 0.01 0.00 - 0.08 ng/mL   Comment 3            Comment: Due to the release kinetics of cTnI, a negative result within the first hours of the onset of symptoms does not rule out myocardial infarction with certainty. If myocardial infarction is still suspected, repeat the test at appropriate intervals.   APTT     Status: Abnormal   Collection Time: 07/25/15 11:00 PM  Result Value Ref Range   aPTT 23 (L) 24 - 37 seconds  Protime-INR     Status: None   Collection Time: 07/25/15 11:00 PM  Result Value Ref Range   Prothrombin Time 13.5 11.6 - 15.2 seconds   INR 1.01 0.00 - 1.49  CBC with Differential     Status: None   Collection Time: 07/25/15 11:00 PM  Result Value Ref Range   WBC 7.4 4.0 - 10.5 K/uL   RBC 4.89 4.22 - 5.81 MIL/uL   Hemoglobin 15.7 13.0 - 17.0 g/dL   HCT 44.7 39.0 - 52.0 %   MCV 91.4 78.0 - 100.0 fL   MCH 32.1 26.0 - 34.0 pg   MCHC 35.1 30.0 - 36.0 g/dL   RDW 14.3 11.5 - 15.5 %   Platelets 223 150 - 400 K/uL   Neutrophils Relative % 70 43 - 77 %   Neutro Abs 5.2 1.7 - 7.7 K/uL   Lymphocytes Relative 23 12 - 46 %   Lymphs Abs 1.7 0.7 - 4.0 K/uL   Monocytes Relative 7 3 - 12 %   Monocytes Absolute 0.5 0.1 - 1.0 K/uL   Eosinophils Relative 0 0 - 5 %   Eosinophils Absolute 0.0 0.0 - 0.7 K/uL   Basophils Relative 0 0 - 1 %   Basophils Absolute 0.0 0.0 - 0.1 K/uL  Urinalysis, Routine w reflex microscopic (not at [facility])     Status: Abnormal   Collection Time: 07/26/15  1:05 AM  Result Value Ref Range   Color, Urine YELLOW YELLOW   APPearance CLOUDY (A) CLEAR   Specific Gravity, Urine 1.007 1.005 - 1.030   pH 6.0 5.0 - 8.0   Glucose, UA NEGATIVE NEGATIVE mg/dL   Hgb urine dipstick NEGATIVE NEGATIVE   Bilirubin Urine NEGATIVE NEGATIVE   Ketones, ur 15 (A) NEGATIVE  mg/dL   Protein, ur NEGATIVE NEGATIVE mg/dL   Urobilinogen, UA 0.2 0.0 - 1.0 mg/dL   Nitrite NEGATIVE NEGATIVE   Leukocytes, UA NEGATIVE NEGATIVE    Comment: MICROSCOPIC NOT DONE ON URINES WITH NEGATIVE PROTEIN, BLOOD, LEUKOCYTES, NITRITE, OR GLUCOSE <1000 mg/dL.  Ethanol     Status: None   Collection Time: 07/29/15  3:24 PM  Result Value Ref Range   Alcohol, Ethyl (B) <5 <5 mg/dL    Comment:        LOWEST DETECTABLE LIMIT FOR SERUM ALCOHOL IS 5 mg/dL FOR MEDICAL PURPOSES ONLY   Protime-INR     Status: None   Collection Time: 07/29/15  3:26 PM  Result Value Ref Range   Prothrombin Time 14.0 11.6 - 15.2 seconds   INR 1.06 0.00 - 1.49  APTT     Status: Abnormal   Collection Time: 07/29/15  3:26 PM  Result Value Ref Range   aPTT 23 (L) 24 - 37 seconds  CBC     Status: None   Collection Time: 07/29/15  3:26 PM  Result Value Ref Range   WBC 7.4 4.0 - 10.5 K/uL   RBC 4.76 4.22 - 5.81 MIL/uL   Hemoglobin 15.3 13.0 - 17.0 g/dL   HCT 43.8 39.0 - 52.0 %   MCV 92.0 78.0 - 100.0 fL   MCH 32.1 26.0 - 34.0 pg   MCHC 34.9 30.0 - 36.0 g/dL   RDW 14.3 11.5 - 15.5 %   Platelets 195 150 - 400 K/uL  Differential     Status: None   Collection Time: 07/29/15  3:26 PM  Result Value Ref Range   Neutrophils Relative % 73 43 - 77 %   Neutro Abs 5.4 1.7 - 7.7 K/uL   Lymphocytes Relative 19 12 - 46 %   Lymphs Abs 1.4 0.7 - 4.0 K/uL   Monocytes Relative 7 3 - 12 %   Monocytes Absolute 0.5 0.1 - 1.0 K/uL   Eosinophils Relative 1 0 - 5 %   Eosinophils Absolute 0.1 0.0 - 0.7 K/uL   Basophils Relative 0 0 - 1 %   Basophils Absolute 0.0 0.0 - 0.1 K/uL  Comprehensive metabolic panel     Status: Abnormal   Collection Time: 07/29/15  3:26 PM  Result Value Ref Range   Sodium 138 135 - 145 mmol/L   Potassium 4.2 3.5 - 5.1 mmol/L   Chloride 105 101 - 111 mmol/L   CO2 24 22 - 32 mmol/L   Glucose, Bld 133 (H) 65 - 99 mg/dL   BUN 8 6 - 20 mg/dL   Creatinine, Ser 1.06 0.61 - 1.24 mg/dL   Calcium 8.7  (L) 8.9 - 10.3 mg/dL   Total Protein 6.4 (L) 6.5 - 8.1 g/dL   Albumin 3.8 3.5 - 5.0 g/dL   AST 25 15 - 41 U/L   ALT 16 (L) 17 - 63 U/L   Alkaline Phosphatase 82 38 - 126 U/L   Total Bilirubin 0.7 0.3 - 1.2 mg/dL   GFR calc non Af Amer >60 >60 mL/min   GFR calc Af Amer >60 >60 mL/min  Comment: (NOTE) The eGFR has been calculated using the CKD EPI equation. This calculation has not been validated in all clinical situations. eGFR's persistently <60 mL/min signify possible Chronic Kidney Disease.    Anion gap 9 5 - 15  I-stat troponin, ED (not at [facility], [facility])     Status: None   Collection Time: 07/29/15  3:36 PM  Result Value Ref Range   Troponin i, poc 0.01 0.00 - 0.08 ng/mL   Comment 3            Comment: Due to the release kinetics of cTnI, a negative result within the first hours of the onset of symptoms does not rule out myocardial infarction with certainty. If myocardial infarction is still suspected, repeat the test at appropriate intervals.   I-Stat Chem 8, ED  (not at [facility], [facility])     Status: Abnormal   Collection Time: 07/29/15  3:38 PM  Result Value Ref Range   Sodium 136 135 - 145 mmol/L   Potassium 5.3 (H) 3.5 - 5.1 mmol/L   Chloride 103 101 - 111 mmol/L   BUN 12 6 - 20 mg/dL   Creatinine, Ser 0.90 0.61 - 1.24 mg/dL   Glucose, Bld 128 (H) 65 - 99 mg/dL   Calcium, Ion 1.01 (L) 1.13 - 1.30 mmol/L   TCO2 25 0 - 100 mmol/L   Hemoglobin 16.0 13.0 - 17.0 g/dL   HCT 47.0 39.0 - 52.0 %  Urine rapid drug screen (hosp performed)not at [facility]     Status: Abnormal   Collection Time: 07/29/15  5:08 PM  Result Value Ref Range   Opiates NONE DETECTED NONE DETECTED   Cocaine NONE DETECTED NONE DETECTED   Benzodiazepines POSITIVE (A) NONE DETECTED   Amphetamines NONE DETECTED NONE DETECTED   Tetrahydrocannabinol NONE DETECTED NONE DETECTED   Barbiturates NONE DETECTED NONE DETECTED    Comment:        DRUG SCREEN FOR MEDICAL PURPOSES ONLY.  IF CONFIRMATION IS NEEDED FOR ANY  PURPOSE, NOTIFY LAB WITHIN 5 DAYS.        LOWEST DETECTABLE LIMITS FOR URINE DRUG SCREEN Drug Class       Cutoff (ng/mL) Amphetamine      1000 Barbiturate      200 Benzodiazepine   093 Tricyclics       818 Opiates          300 Cocaine          300 THC              50   Urinalysis, Routine w reflex microscopic (not at [facility])     Status: None   Collection Time: 07/29/15  5:08 PM  Result Value Ref Range   Color, Urine YELLOW YELLOW   APPearance CLEAR CLEAR   Specific Gravity, Urine 1.009 1.005 - 1.030   pH 7.0 5.0 - 8.0   Glucose, UA NEGATIVE NEGATIVE mg/dL   Hgb urine dipstick NEGATIVE NEGATIVE   Bilirubin Urine NEGATIVE NEGATIVE   Ketones, ur NEGATIVE NEGATIVE mg/dL   Protein, ur NEGATIVE NEGATIVE mg/dL   Urobilinogen, UA 0.2 0.0 - 1.0 mg/dL   Nitrite NEGATIVE NEGATIVE   Leukocytes, UA NEGATIVE NEGATIVE    Comment: MICROSCOPIC NOT DONE ON URINES WITH NEGATIVE PROTEIN, BLOOD, LEUKOCYTES, NITRITE, OR GLUCOSE <1000 mg/dL.  Hemoglobin A1c     Status: None   Collection Time: 07/30/15  5:50 AM  Result Value Ref Range   Hgb A1c MFr Bld 5.5 4.8 - 5.6 %  Comment: (NOTE)         Pre-diabetes: 5.7 - 6.4         Diabetes: >6.4         Glycemic control for adults with diabetes: <7.0    Mean Plasma Glucose 111 mg/dL    Comment: (NOTE) Performed At: [facility] [place], [place] [id number] Lindon Romp MD QX:[id number]   Lipid panel     Status: None   Collection Time: 07/30/15  5:50 AM  Result Value Ref Range   Cholesterol 125 0 - 200 mg/dL   Triglycerides 107 <150 mg/dL   HDL 42 >40 mg/dL   Total CHOL/HDL Ratio 3.0 RATIO   VLDL 21 0 - 40 mg/dL   LDL Cholesterol 62 0 - 99 mg/dL    Comment:        Total Cholesterol/HDL:CHD Risk Coronary Heart Disease Risk Table                     Men   Women  1/2 Average Risk   3.4   3.3  Average Risk       5.0   4.4  2 X Average Risk   9.6   7.1  3 X Average Risk  23.4   11.0        Use the calculated  Patient Ratio above and the CHD Risk Table to determine the patient's CHD Risk.        ATP III CLASSIFICATION (LDL):  <100     mg/dL   Optimal  100-129  mg/dL   Near or Above                    Optimal  130-159  mg/dL   Borderline  160-189  mg/dL   High  >190     mg/dL   Very High   Basic metabolic panel     Status: Abnormal   Collection Time: 07/30/15  2:49 PM  Result Value Ref Range   Sodium 137 135 - 145 mmol/L   Potassium 4.4 3.5 - 5.1 mmol/L   Chloride 103 101 - 111 mmol/L   CO2 22 22 - 32 mmol/L   Glucose, Bld 96 65 - 99 mg/dL   BUN 8 6 - 20 mg/dL   Creatinine, Ser 0.91 0.61 - 1.24 mg/dL   Calcium 8.3 (L) 8.9 - 10.3 mg/dL   GFR calc non Af Amer >60 >60 mL/min   GFR calc Af Amer >60 >60 mL/min    Comment: (NOTE) The eGFR has been calculated using the CKD EPI equation. This calculation has not been validated in all clinical situations. eGFR's persistently <60 mL/min signify possible Chronic Kidney Disease.    Anion gap 12 5 - 15  Phenytoin level, total     Status: Abnormal   Collection Time: 07/30/15  7:56 PM  Result Value Ref Range   Phenytoin Lvl 32.2 (HH) 10.0 - 20.0 ug/mL    Comment: CRITICAL RESULT CALLED TO, READ BACK BY AND VERIFIED WITH: TEMBO,D RN 07/30/2015 2129 JORDANS REPEATED TO VERIFY   Amitriptyline level     Status: Abnormal   Collection Time: 07/30/15  7:56 PM  Result Value Ref Range   Amitriptyline 136 mcg/L   Nortriptyline-AMITR 145 mcg/L   Amitrip+Nortrip 281 (H) 100 - 250 mcg/L    Comment: Performed at [facility]  Glucose, capillary     Status: Abnormal   Collection Time: 07/31/15  9:05 PM  Result  Value Ref Range   Glucose-Capillary 133 (H) 65 - 99 mg/dL  Phenytoin level, total     Status: Abnormal   Collection Time: 08/01/15  3:28 AM  Result Value Ref Range   Phenytoin Lvl 27.9 (H) 10.0 - 20.0 ug/mL  CBC     Status: None   Collection Time: 08/01/15  3:28 AM  Result Value Ref Range   WBC 7.1 4.0 - 10.5 K/uL   RBC 4.84 4.22  - 5.81 MIL/uL   Hemoglobin 15.6 13.0 - 17.0 g/dL   HCT 45.3 39.0 - 52.0 %   MCV 93.6 78.0 - 100.0 fL   MCH 32.2 26.0 - 34.0 pg   MCHC 34.4 30.0 - 36.0 g/dL   RDW 14.4 11.5 - 15.5 %   Platelets 195 150 - 400 K/uL  Basic metabolic panel     Status: None   Collection Time: 08/01/15  3:28 AM  Result Value Ref Range   Sodium 138 135 - 145 mmol/L   Potassium 3.8 3.5 - 5.1 mmol/L   Chloride 103 101 - 111 mmol/L   CO2 26 22 - 32 mmol/L   Glucose, Bld 99 65 - 99 mg/dL   BUN 9 6 - 20 mg/dL   Creatinine, Ser 0.89 0.61 - 1.24 mg/dL   Calcium 9.0 8.9 - 10.3 mg/dL   GFR calc non Af Amer >60 >60 mL/min   GFR calc Af Amer >60 >60 mL/min    Comment: (NOTE) The eGFR has been calculated using the CKD EPI equation. This calculation has not been validated in all clinical situations. eGFR's persistently <60 mL/min signify possible Chronic Kidney Disease.    Anion gap 9 5 - 15  CBC     Status: None   Collection Time: 08/02/15  6:48 AM  Result Value Ref Range   WBC 7.7 4.0 - 10.5 K/uL   RBC 4.92 4.22 - 5.81 MIL/uL   Hemoglobin 15.6 13.0 - 17.0 g/dL   HCT 45.2 39.0 - 52.0 %   MCV 91.9 78.0 - 100.0 fL   MCH 31.7 26.0 - 34.0 pg   MCHC 34.5 30.0 - 36.0 g/dL   RDW 14.5 11.5 - 15.5 %   Platelets 188 150 - 400 K/uL  Basic metabolic panel     Status: Abnormal   Collection Time: 08/02/15  6:48 AM  Result Value Ref Range   Sodium 135 135 - 145 mmol/L   Potassium 4.1 3.5 - 5.1 mmol/L   Chloride 102 101 - 111 mmol/L   CO2 24 22 - 32 mmol/L   Glucose, Bld 97 65 - 99 mg/dL   BUN 9 6 - 20 mg/dL   Creatinine, Ser 0.91 0.61 - 1.24 mg/dL   Calcium 8.8 (L) 8.9 - 10.3 mg/dL   GFR calc non Af Amer >60 >60 mL/min   GFR calc Af Amer >60 >60 mL/min    Comment: (NOTE) The eGFR has been calculated using the CKD EPI equation. This calculation has not been validated in all clinical situations. eGFR's persistently <60 mL/min signify possible Chronic Kidney Disease.    Anion gap 9 5 - 15       Constitutional:  BP 133/84 mmHg  Pulse 82  Ht 5' 11"  (1.803 m)  Wt 192 lb 6.4 oz (87.272 kg)  BMI 26.85 kg/m2   Musculoskeletal: Strength & Muscle Tone: within normal limits Gait & Station: normal Patient leans: N/A  Psychiatric Specialty Exam: General Appearance: Casual  Eye Contact::  Fair  Speech:  Slow  Volume:  Decreased  Mood:  Anxious  Affect:  Congruent  Thought Process:  Coherent  Orientation:  Full (Time, Place, and Person)  Thought Content:  Rumination  Suicidal Thoughts:  No  Homicidal Thoughts:  No  Memory:  Immediate;   Fair Recent;   Fair Remote;   Fair  Judgement:  Fair  Insight:  Good  Psychomotor Activity:  Decreased  Concentration:  Fair  Recall:  [employer] of Knowledge:  Fair  Language:  Good  Akathisia:  No  Handed:  Right  AIMS (if indicated):     Assets:  Communication Skills Desire for Improvement Financial Resources/Insurance Housing  ADL's:  Intact  Cognition:  Impaired,  Mild  Sleep:        Established Problem, Stable/Improving (1), New problem, with additional work up planned, Review of Psycho-Social Stressors (1), Review or order clinical lab tests (1), Decision to obtain old records (1), Review and summation of old records (2), New Problem, with no additional work-up planned (3), Review of Medication Regimen & Side Effects (2) and Review of New Medication or Change in Dosage (2)  Assessment: Axis I: Schizoaffective disorder depressed type.  Rule out major depressive disorder recurrent moderate.  Axis II: Deferred  Axis III:  Past Medical History  Diagnosis Date  . Seizures   . Hypertension   . GERD (gastroesophageal reflux disease)   . Dyslipidemia   . Schizoaffective disorder   . Depression   . Anxiety   . MRSA carrier   . Constipation   . Memory loss   . Hearing loss      Plan:  I review his symptoms, history, collateral information, recent blood work results, current medication and discharge summary.   Patient is feeling better since he had cut down his medication.  He is less sedated and his energy level is better.  He is taking Seroquel 600 mg at bedtime and amitriptyline 150 mg.  He is taking Klonopin 2 mg as needed and maximum he has taken twice a day.  However he like to come off from Klonopin slowly.  Patient is scheduled to see neurologist and of this month for seizure medication management.  I recommended to talk about his memory impairment as patient has noticed memory impairment.  We also talk about [place] treatment and patient will consider it if he feel his symptoms started to get worse.  Discussed psycho topic medication side effects and benefits specialty metabolic syndrome, sedation, anticholinergic side effects and tremors.  I will also recommended to see therapist in his office for coping and social skills.  Discuss safety concern that anytime having active suicidal thoughts or homicidal thought but he need to call 911 or go to the local emergency room.  Time spent 55 minutes.  More than 50% of the time spent in psychoeducation, counseling and coordination of care.  Zoella Roberti T., MD 08/11/2015

## 2015-08-16 ENCOUNTER — Ambulatory Visit: Payer: Self-pay | Admitting: Adult Health

## 2015-08-16 ENCOUNTER — Ambulatory Visit: Payer: Medicaid Other | Admitting: Adult Health

## 2015-08-17 ENCOUNTER — Encounter: Payer: Self-pay | Admitting: Adult Health

## 2015-08-29 ENCOUNTER — Other Ambulatory Visit: Payer: Self-pay | Admitting: Family Medicine

## 2015-08-31 ENCOUNTER — Other Ambulatory Visit: Payer: Self-pay | Admitting: Family Medicine

## 2015-08-31 NOTE — Telephone Encounter (Signed)
2nd request.  Anishka Bushard L, RN  

## 2015-09-05 ENCOUNTER — Ambulatory Visit (HOSPITAL_COMMUNITY): Payer: Self-pay | Admitting: Psychiatry

## 2015-09-05 ENCOUNTER — Other Ambulatory Visit: Payer: Self-pay | Admitting: Family Medicine

## 2015-09-05 NOTE — Telephone Encounter (Signed)
Left message on voicemail with message from MD. 

## 2015-09-05 NOTE — Telephone Encounter (Signed)
Patient recently established with Dr. Adele Schilder at [facility] - see note from end of August. Refills on psychiatric drugs should come from psychiatry provider, as it's unsafe for him to be getting these rx's from multiple different providers. Please inform patient that he should contact Dr. Marguerite Olea office for refills.  Leeanne Rio, MD

## 2015-09-05 NOTE — Telephone Encounter (Signed)
Pt called and needs a refill on his Klonopin. jw

## 2015-09-15 ENCOUNTER — Other Ambulatory Visit: Payer: Self-pay | Admitting: Family Medicine

## 2015-09-15 ENCOUNTER — Ambulatory Visit: Payer: Self-pay | Admitting: Family Medicine

## 2015-09-15 ENCOUNTER — Ambulatory Visit (HOSPITAL_COMMUNITY): Payer: Self-pay | Admitting: Psychiatry

## 2015-09-16 ENCOUNTER — Encounter: Payer: Self-pay | Admitting: Internal Medicine

## 2015-09-16 ENCOUNTER — Telehealth: Payer: Self-pay | Admitting: Family Medicine

## 2015-09-16 ENCOUNTER — Ambulatory Visit (INDEPENDENT_AMBULATORY_CARE_PROVIDER_SITE_OTHER): Payer: Medicaid Other | Admitting: Internal Medicine

## 2015-09-16 VITALS — BP 143/77 | HR 120 | Temp 98.1°F | Wt 182.0 lb

## 2015-09-16 DIAGNOSIS — E291 Testicular hypofunction: Secondary | ICD-10-CM

## 2015-09-16 DIAGNOSIS — R202 Paresthesia of skin: Secondary | ICD-10-CM | POA: Diagnosis not present

## 2015-09-16 MED ORDER — TESTOSTERONE CYPIONATE 200 MG/ML IM SOLN: 400.0000 mg | INTRAMUSCULAR | Status: DC

## 2015-09-16 NOTE — Telephone Encounter (Signed)
CSW received a call from pt stating he was unable to make his appt yesterday due to transportation challenges. Pt states he is very concerned about the numbness he is having in his fingers. Pt denies having any other means of transportation. CSW provided pt with the contact number to Medicaid transportation [phone] and encouraged him to call today to ensure that he is or can be setup for future appts. Pt agreed to contact Medicaid transportation and shortly after informed CSW that he LVM with them. CSW informed pt that CSW could get pt a taxi to bring him to a same day appointment today. Pt was extremely appreciative of assistance and stated that he could be ready in the next few minutes.  CSW has arranged for [facility] [phone] to pick-up pt and bring him to [facility]. CSW will request for nursing staff to arrange a taxi for pt home as well. This is a 1x voucher and pt aware that he needs to call Medicaid Transportation again on Monday, pt agreeable.  Hunt Oris, MSW, [place]

## 2015-09-16 NOTE — Assessment & Plan Note (Signed)
-  Referred to ambulatory neurology for nerve conduction studies. -Encouraged patient to continue to use wrist splints at night and monitor for improvement. -Ordered B12 level and phenytoin level (future labs, patient will have to schedule appt)

## 2015-09-16 NOTE — Assessment & Plan Note (Signed)
-  Increased testosterone shot back to 400 mg/ml q14 days, after review of last visits. -Scheduled testosterone level as future lab for patient to schedule 1 week after his next shot. (Patient to schedule appointment. Does have transportation difficulties--trying to get rides arranged through Medicaid.)

## 2015-09-16 NOTE — Progress Notes (Signed)
Subjective: [name] is a 59 y.o. male patient of [name] Netters, MD, presenting for numbness of both hands.  Hand paresthesia -Began 2 months ago in his right hand in the pads of his fingers and has gradually spread downwards -Left hand numbness began about 1 month ago -Started wearing wrist splints inconsistently overnight 10 days ago with slight improvement of left hand numbness -Had carpal tunnel syndrome 15 years ago but states it was different (painful, not just numb as is now) -Has been taking phenytoin as prescribed (250 mg nightly), quetiapine as prescribed (300 mg BID), and amitriptyline as prescribed (150 mg) -Has not been eating much (often skips breakfast, has 1/2 a sandwich for lunch, and often skips dinner if it gets too late in the evening) -Only repetitive movement is masturbation but this has not changed in frequency -Currently having difficulty buttoning his shirts. He is concerned he is going to lose feeling in his hands permanently.   Androgen deficiency -Last testosterone shot was 200 mg/ml, whereas had been taking 400 mg/ml -Has noticed decreased libido and increased depression -Negatively affecting his romantic relationship with his girlfriend  - ROS: denies loss of balance, change in vision, no recent seizures - Nonsmoker  Objective: BP 143/77 mmHg  Pulse 120  Temp(Src) 98.1 F (36.7 C) (Oral)  Wt 182 lb (82.555 kg) Gen: Well-appearing 59 y.o. male in no distress Neuro: Alert and oriented x 3. No nystagmus. Decreased sensation across fingers 1-4 bilaterally, both on palmar and dorsal surfaces, but palmar numbness extends down length of fingers and dorsal numbness is just at fingertips. Could not elicit tingling with Phalen's or Tinel's test. Paresthesia worst on right hand on 2nd finger and worst on left hand on fingers 1-3. Strength intact. Skin: Hands warm and well-perfused. Good capillary refill.    Assessment/Plan: [name] is a 59 y.o. male  here for bilateral hand paresthesia.  Return for follow-up after appointment with neurology (referral made 09/16/15)  Paresthesia of both hands -Referred to ambulatory neurology for nerve conduction studies. -Encouraged patient to continue to use wrist splints at night and monitor for improvement. -Ordered B12 level and phenytoin level (future labs, patient will have to schedule appt)   Androgen deficiency -Increased testosterone shot back to 400 mg/ml q14 days, after review of last visits. -Scheduled testosterone level as future lab for patient to schedule 1 week after his next shot. (Patient to schedule appointment. Does have transportation difficulties--trying to get rides arranged through Medicaid.)

## 2015-09-16 NOTE — Patient Instructions (Signed)
[name], it was nice to see you today.  For the numbness in your hands, I have put in a referral to neurology to get nerve conduction studies. You should be called for an appointment within the next week.  I would also like to get labs: vitamin B12 and dilantin levels. I have placed the orders for these labs, but you will need to schedule an appointment to get these labs drawn at your convenience.  I have increased your testosterone prescription to 400 mg/ml injection every 14 days. I have placed an order for testosterone level. Please schedule a lab visit for approximately 1 week after you take the 400 mg/ml shot (your next dose). This should be around 10/14.  Please make an appointment back to see [place] once you've seen neurology.   Thank you!

## 2015-09-16 NOTE — Telephone Encounter (Signed)
Pt called because he is having numbness in his fingers. He had an appointment for 9/29 but didn't have a ride to get there. He said that he needs to try and find transportation. He is also worried about the numbness in his fingers, He is wearing a type of splint at night to try and help this. Please call patient. jw

## 2015-09-19 ENCOUNTER — Ambulatory Visit (INDEPENDENT_AMBULATORY_CARE_PROVIDER_SITE_OTHER): Payer: Medicaid Other | Admitting: Clinical

## 2015-09-19 ENCOUNTER — Encounter (HOSPITAL_COMMUNITY): Payer: Self-pay | Admitting: Clinical

## 2015-09-19 DIAGNOSIS — F251 Schizoaffective disorder, depressive type: Secondary | ICD-10-CM
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
## 2015-09-27 ENCOUNTER — Other Ambulatory Visit: Payer: Self-pay | Admitting: Family Medicine

## 2015-09-27 ENCOUNTER — Encounter (HOSPITAL_COMMUNITY): Payer: Self-pay | Admitting: Psychiatry

## 2015-09-27 ENCOUNTER — Ambulatory Visit (INDEPENDENT_AMBULATORY_CARE_PROVIDER_SITE_OTHER): Payer: Medicaid Other | Admitting: Psychiatry

## 2015-09-27 VITALS — BP 119/77 | HR 92 | Ht 71.0 in | Wt 192.6 lb

## 2015-09-27 DIAGNOSIS — F251 Schizoaffective disorder, depressive type: Secondary | ICD-10-CM

## 2015-09-27 DIAGNOSIS — F333 Major depressive disorder, recurrent, severe with psychotic symptoms: Secondary | ICD-10-CM

## 2015-09-27 MED ORDER — QUETIAPINE FUMARATE 300 MG PO TABS: 600.0000 mg | ORAL_TABLET | Freq: Every day | ORAL | Status: DC

## 2015-09-27 MED ORDER — AMITRIPTYLINE HCL 150 MG PO TABS: 150.0000 mg | ORAL_TABLET | Freq: Every day | ORAL | Status: DC

## 2015-09-27 MED ORDER — LORAZEPAM 1 MG PO TABS: 1.0000 mg | ORAL_TABLET | Freq: Three times a day (TID) | ORAL | Status: DC

## 2015-09-27 NOTE — Progress Notes (Signed)
[facility] [phone] Progress Note  [name] [id number] 59 y.o.  09/27/2015 4:15 PM  Chief Complaint:  I still have no motivation to do many things.  I'm not suicidal but I still paranoid.  I have no energy.    History of Present Illness:  [name] came for his follow-up appointment.  He is a 59 year old Caucasian unemployed single man who was referred from inpatient psychiatric services and seen first time as initial evaluation on August 25.  Patient was admitted due to severe depression and having suicidal thoughts and plan to asphyxiate himself with the helium.  He was taking Seroquel , Klonopin and amitriptyline and dosage was reduced because he was very tired, having memory issues and he was delirious and sedated.  He is taking Seroquel 600 mg at bedtime, Klonopin 2 mg twice a day and amitriptyline 150 mg at bedtime.  He still feels sometimes tired, lack of energy, having memory issues.  Patient is still feel having paranoia and having trust issues.  He continues to believe that his brother who lives in [place] has been a spying on him.  Patient told he struggled with chronic depression and he has episodes of feeling hopelessness and worthlessness.  Though he denies any suicidal thoughts.  We have discussion about TMS and ECT.  However due to memory impairment he does not interested in ECT treatment.  He is seeing neurologist for seizures and he takes Vimpat and Dilantin.  Though patient denies any aggressive behavior, suicidal thoughts or homicidal thought but admitted socially withdrawn, isolated, difficulty doing multitasking.  He does not leave his house unless it is important.  He does not do his ADLs very well.  Patient told sometimes he pee in container because he does not like to go to bathroom.  When I asked it is because of depression he replied no.  He endorsed that he feel he does not need to get up sometimes.  He is concerned about his memory, fatigue and lack of energy.  He  wants to come off from [place] .  He denies any nightmares, flashback, manic symptoms.  His appetite is okay.  His vitals are stable.  Patient recently seen his primary care physician for numbness in his hand.  He was recommended for not conduction studies.  Suicidal Ideation: No Plan Formed: No Patient has means to carry out plan: No  Homicidal Ideation: No Plan Formed: No Patient has means to carry out plan: No  Past Psychiatric History/Hospitalization(s): Patient has severe depression since his college.  He's been taking medication since 1984.  He has at least 15 psychiatric hospitalization due to severe depression, paranoia and suicidal thoughts.  His [facility] where he was admitted from July 27 to 07/21/2015.  He was thinking to asphyxiate on helium.  At that time he was taking Seroquel 900 mg at bedtime, amitriptyline 400 mg at bedtime and Klonopin 2 mg 4 times a day.  He had one history of suicidal attempt when he cut his testicle because he does not want to be depressed.  He admitted history of paranoia and negative thinking.  He was seeing at [name] at [facility] however it closed he moved to [name] to continue with [name].  He had tried numerous SSRIs however he always had opposite reaction with the SSRI.  He felt poor sleep, restlessness, irritability and unable to tolerate SSRIs.  In the past she had tried nortriptyline, Haldol,  Geodon, Risperdal, lithium, Prozac, Paxil, Lexapro and Zoloft.  He denies any history of mania.  Patient denies any history of drugs or alcohol use. Anxiety: No Bipolar Disorder: No Depression: Yes Mania: No Psychosis: Paranoia and negative thinking Schizophrenia: No Personality Disorder: No Hospitalization for psychiatric illness: No History of Electroconvulsive Shock Therapy: No Prior Suicide Attempts: Yes  Medical History; Patient has hypertension, GERD, hyperlipidemia,  mild memory impairment, seizure disorder.  His primary care physician is [facility]. Patient also goes to [facility] neurology for seizures management.  Family History; Patient endorse uncle had severe depression and committed suicide by killing himself  Education and Work History; Patient was in the college when he had severe depression and he has to leave the college.  He is on disability.  Psychosocial History; Patient born and raised in [place].  He never married.  He has no children.  He has a good friend who he know for 15 years and very supportive.  His parents are deceased.  His father died 3 years ago.  He has one brother who live in [place].  Review of Systems  Constitutional: Negative.   Cardiovascular: Negative for chest pain and palpitations.  Musculoskeletal: Negative.   Skin: Negative for itching and rash.  Neurological: Positive for tingling. Negative for dizziness, tremors and headaches.       Numbness in his both hands  Psychiatric/Behavioral: Positive for depression and memory loss. The patient is nervous/anxious.     Psychiatric: Agitation: No Hallucination: No Depressed Mood: Yes Insomnia: No Hypersomnia: No Altered Concentration: No Feels Worthless: No Grandiose Ideas: No Belief In Special Powers: No New/Increased Substance Abuse: No Compulsions: No  Neurologic: Headache: No Seizure: History of seizures Paresthesias: Yes   Outpatient Encounter Prescriptions as of 09/27/2015  Medication Sig  . amitriptyline (ELAVIL) 150 MG tablet Take 1 tablet (150 mg total) by mouth at bedtime.  Marland Kitchen aspirin EC 81 MG tablet Take 1 tablet (81 mg total) by mouth daily.  . Lacosamide (VIMPAT) 150 MG TABS Take 1 tablet (150 mg total) by mouth 2 (two) times daily.  Marland Kitchen loratadine (CLARITIN) 10 MG tablet Take 1 tablet (10 mg total) by mouth daily. (Patient not taking: Reported on 09/19/2015)  . LORazepam (ATIVAN) 1 MG tablet Take 1 tablet (1 mg total) by mouth 3  (three) times daily.  . meclizine (ANTIVERT) 25 MG tablet Take 25 mg by mouth 3 (three) times daily.   . meclizine (ANTIVERT) 50 MG tablet Take 0.5 tablets (25 mg total) by mouth 3 (three) times daily as needed for dizziness. (Patient not taking: Reported on 07/29/2015)  . metoprolol succinate (TOPROL-XL) 50 MG 24 hr tablet TAKE 1 TABLET (50 MG TOTAL) BY MOUTH AT BEDTIME. TAKE WITH OR IMMEDIATELY FOLLOWING A MEAL.  Marland Kitchen phenytoin (DILANTIN INFATABS) 50 MG tablet Take one 31m tab at night along with two 1027mcapsules for a total of 25040mt night.  . phenytoin (DILANTIN) 100 MG ER capsule Take 2 capsules at night, along with a 22m67mblet at night for a total of 222mg6mnight.  . polyethylene glycol (MIRALAX / GLYCOLAX) packet Take 17 g by mouth 2 (two) times daily as needed. For constipation. Mix into 8 ounces of fluid & drink (Patient taking differently: Take 17 g by mouth 2 (two) times daily as needed (constipation). . Mix into 8 ounces of fluid & drink)  . QUEtiapine (SEROQUEL) 300 MG tablet Take 2 tablets (600 mg total) by mouth at bedtime.  . senna ([employer]  SENNA) 8.6 MG tablet Take 1 tablet (8.6 mg total) by mouth daily as needed for constipation.  . senna-docusate (CVS SENNA PLUS) 8.6-50 MG per tablet Take 1 tablet by mouth at bedtime as needed for mild constipation.  . senna-docusate (SENOKOT-S) 8.6-50 MG per tablet Take 1 tablet by mouth at bedtime. (Patient taking differently: Take 1 tablet by mouth at bedtime as needed (constipation). )  . simvastatin (ZOCOR) 40 MG tablet Take 1 tablet (40 mg total) by mouth at bedtime.  Marland Kitchen testosterone cypionate (DEPOTESTOSTERONE CYPIONATE) 200 MG/ML injection Inject 2 mLs (400 mg total) into the muscle every 14 (fourteen) days.  . [DISCONTINUED] amitriptyline (ELAVIL) 150 MG tablet Take 1 tablet (150 mg total) by mouth at bedtime.  . [DISCONTINUED] clonazePAM (KLONOPIN) 2 MG tablet Take 1 tablet (2 mg total) by mouth 3 (three) times daily. (Patient taking  differently: Take 2 mg by mouth See admin instructions. Take 1 tablet (2 mg) every night at bedtime, may take an additional tablet during the day if needed for anxiety)  . [DISCONTINUED] QUEtiapine (SEROQUEL) 300 MG tablet Take 2 tablets (600 mg total) by mouth at bedtime.   No facility-administered encounter medications on file as of 09/27/2015.    Recent Results (from the past 2160 hour(s))  Comprehensive metabolic panel     Status: Abnormal   Collection Time: 07/12/15 10:28 PM  Result Value Ref Range   Sodium 139 135 - 145 mmol/L   Potassium 4.2 3.5 - 5.1 mmol/L   Chloride 106 101 - 111 mmol/L   CO2 25 22 - 32 mmol/L   Glucose, Bld 122 (H) 65 - 99 mg/dL   BUN 18 6 - 20 mg/dL   Creatinine, Ser 1.23 0.61 - 1.24 mg/dL   Calcium 8.8 (L) 8.9 - 10.3 mg/dL   Total Protein 7.5 6.5 - 8.1 g/dL   Albumin 4.3 3.5 - 5.0 g/dL   AST 33 15 - 41 U/L   ALT 23 17 - 63 U/L   Alkaline Phosphatase 86 38 - 126 U/L   Total Bilirubin 0.7 0.3 - 1.2 mg/dL   GFR calc non Af Amer >60 >60 mL/min   GFR calc Af Amer >60 >60 mL/min    Comment: (NOTE) The eGFR has been calculated using the CKD EPI equation. This calculation has not been validated in all clinical situations. eGFR's persistently <60 mL/min signify possible Chronic Kidney Disease.    Anion gap 8 5 - 15  Ethanol (ETOH)     Status: None   Collection Time: 07/12/15 10:28 PM  Result Value Ref Range   Alcohol, Ethyl (B) <5 <5 mg/dL    Comment:        LOWEST DETECTABLE LIMIT FOR SERUM ALCOHOL IS 5 mg/dL FOR MEDICAL PURPOSES ONLY   Salicylate level     Status: None   Collection Time: 07/12/15 10:28 PM  Result Value Ref Range   Salicylate Lvl <3.4 2.8 - 30.0 mg/dL  Acetaminophen level     Status: Abnormal   Collection Time: 07/12/15 10:28 PM  Result Value Ref Range   Acetaminophen (Tylenol), Serum <10 (L) 10 - 30 ug/mL    Comment:        THERAPEUTIC CONCENTRATIONS VARY SIGNIFICANTLY. A RANGE OF 10-30 ug/mL MAY BE AN  EFFECTIVE CONCENTRATION FOR MANY PATIENTS. HOWEVER, SOME ARE BEST TREATED AT CONCENTRATIONS OUTSIDE THIS RANGE. ACETAMINOPHEN CONCENTRATIONS >150 ug/mL AT 4 HOURS AFTER INGESTION AND >50 ug/mL AT 12 HOURS AFTER INGESTION ARE OFTEN ASSOCIATED WITH TOXIC REACTIONS.  CBC     Status: None   Collection Time: 07/12/15 10:28 PM  Result Value Ref Range   WBC 8.3 4.0 - 10.5 K/uL   RBC 4.52 4.22 - 5.81 MIL/uL   Hemoglobin 14.8 13.0 - 17.0 g/dL   HCT 42.5 39.0 - 52.0 %   MCV 94.0 78.0 - 100.0 fL   MCH 32.7 26.0 - 34.0 pg   MCHC 34.8 30.0 - 36.0 g/dL   RDW 14.5 11.5 - 15.5 %   Platelets 226 150 - 400 K/uL  Urine rapid drug screen (hosp performed) (Not at [facility])     Status: Abnormal   Collection Time: 07/13/15  2:08 AM  Result Value Ref Range   Opiates NONE DETECTED NONE DETECTED   Cocaine NONE DETECTED NONE DETECTED   Benzodiazepines POSITIVE (A) NONE DETECTED   Amphetamines NONE DETECTED NONE DETECTED   Tetrahydrocannabinol NONE DETECTED NONE DETECTED   Barbiturates NONE DETECTED NONE DETECTED    Comment:        DRUG SCREEN FOR MEDICAL PURPOSES ONLY.  IF CONFIRMATION IS NEEDED FOR ANY PURPOSE, NOTIFY LAB WITHIN 5 DAYS.        LOWEST DETECTABLE LIMITS FOR URINE DRUG SCREEN Drug Class       Cutoff (ng/mL) Amphetamine      1000 Barbiturate      200 Benzodiazepine   144 Tricyclics       315 Opiates          300 Cocaine          300 THC              50   TSH     Status: None   Collection Time: 07/13/15  6:10 AM  Result Value Ref Range   TSH 2.644 0.350 - 4.500 uIU/mL    Comment: Performed at [facility]  Phenytoin level, free     Status: Abnormal   Collection Time: 07/13/15  6:10 AM  Result Value Ref Range   Phenytoin, Free 1.3 1.0 - 2.0 ug/mL    Comment: (NOTE)                                Detection Limit = 0.5 Performed At: [facility] [place], [place] [id number] Lindon Romp MD [id number]    Phenytoin, Total 20.4  (H) 10.0 - 20.0 ug/mL    Comment: (NOTE)                                Neonatal:                                Therapeutic 6.0 - 14.0                                Detection Limit =  0.8                          <0.8 Indicates None Detected Patient drug level exceeds published reference range.  Evaluate clinically for signs of potential toxicity. Performed at [facility]   Prolactin     Status: None   Collection Time: 07/13/15  6:10 AM  Result Value Ref Range  Prolactin 12.0 4.0 - 15.2 ng/mL    Comment: (NOTE) Performed At: [facility] [place], [place] [id number] Lindon Romp MD [id number] Performed at [facility]   Testosterone     Status: Abnormal   Collection Time: 07/13/15  6:10 AM  Result Value Ref Range   Testosterone 49 (L) 348 - 1197 ng/dL   Comment, Testosterone Comment     Comment: (NOTE) Adult male reference interval is based on a population of lean males up to 61 years old. Performed At: [facility] [place], [place] [id number] Lindon Romp MD [id number] Performed at [facility]   Testosterone, free     Status: Abnormal   Collection Time: 07/13/15  6:10 AM  Result Value Ref Range   Testosterone, Free 1.9 (L) 6.6 - 18.1 pg/mL    Comment: (NOTE) Performed At: [facility] [place], [place] [id number] Lindon Romp MD [id number] Performed at [facility]   Testosterone, % free     Status: Abnormal   Collection Time: 07/13/15  6:10 AM  Result Value Ref Range   Testosterone-% Free 0.9 (L) 0.2 - 0.7 %    Comment: (NOTE) Percent free testosterone by equilibrium dialysis performed at [facility]. [facility] expected values applicable for percent free testosterone only. Reference Range: Adult Males: 1.5 - 3.2 Performed At: [facility] [place],  [place] [id number] Pepkowitz Sheral Apley MD [id number] Performed at [facility]   Hemoglobin A1c     Status: Abnormal   Collection Time: 07/13/15  6:10 AM  Result Value Ref Range   Hgb A1c MFr Bld 5.9 (H) 4.8 - 5.6 %    Comment: (NOTE)         Pre-diabetes: 5.7 - 6.4         Diabetes: >6.4         Glycemic control for adults with diabetes: <7.0    Mean Plasma Glucose 123 mg/dL    Comment: (NOTE) Performed At: [facility] [place], [place] [id number] Lindon Romp MD [id number] Performed at [facility]   Lipid panel     Status: Abnormal   Collection Time: 07/13/15  6:10 AM  Result Value Ref Range   Cholesterol 175 0 - 200 mg/dL   Triglycerides 154 (H) <150 mg/dL   HDL 62 >40 mg/dL   Total CHOL/HDL Ratio 2.8 RATIO   VLDL 31 0 - 40 mg/dL   LDL Cholesterol 82 0 - 99 mg/dL    Comment:        Total Cholesterol/HDL:CHD Risk Coronary Heart Disease Risk Table                     Men   Women  1/2 Average Risk   3.4   3.3  Average Risk       5.0   4.4  2 X Average Risk   9.6   7.1  3 X Average Risk  23.4   11.0        Use the calculated Patient Ratio above and the CHD Risk Table to determine the patient's CHD Risk.        ATP III CLASSIFICATION (LDL):  <100     mg/dL   Optimal  100-129  mg/dL   Near or Above  Optimal  130-159  mg/dL   Borderline  160-189  mg/dL   High  >190     mg/dL   Very High Performed at [facility]   Sex hormone binding globulin     Status: None   Collection Time: 07/13/15  6:10 AM  Result Value Ref Range   Sex Hormone Binding 47.9 19.3 - 76.4 nmol/L    Comment: (NOTE) Performed At: [place], [place] [id number] Lindon Romp MD [id number] Performed at [facility]   Glucose, capillary     Status: None   Collection Time: 07/14/15  4:54 PM  Result Value Ref Range   Glucose-Capillary 99 65 - 99 mg/dL   CK     Status: None   Collection Time: 07/14/15  7:05 PM  Result Value Ref Range   Total CK 196 49 - 397 U/L    Comment: Performed at [facility]  Glucose, capillary     Status: None   Collection Time: 07/15/15 11:48 AM  Result Value Ref Range   Glucose-Capillary 93 65 - 99 mg/dL   Comment 1 Notify RN    Comment 2 Document in Chart   Glucose, capillary     Status: None   Collection Time: 07/15/15  4:51 PM  Result Value Ref Range   Glucose-Capillary 99 65 - 99 mg/dL  Glucose, capillary     Status: Abnormal   Collection Time: 07/16/15  6:03 AM  Result Value Ref Range   Glucose-Capillary 101 (H) 65 - 99 mg/dL   Comment 1 Notify RN   Glucose, capillary     Status: Abnormal   Collection Time: 07/16/15 11:57 AM  Result Value Ref Range   Glucose-Capillary 106 (H) 65 - 99 mg/dL  Glucose, capillary     Status: None   Collection Time: 07/16/15  4:52 PM  Result Value Ref Range   Glucose-Capillary 88 65 - 99 mg/dL  Comprehensive metabolic panel     Status: Abnormal   Collection Time: 07/16/15  7:20 PM  Result Value Ref Range   Sodium 140 135 - 145 mmol/L   Potassium 4.3 3.5 - 5.1 mmol/L   Chloride 106 101 - 111 mmol/L   CO2 26 22 - 32 mmol/L   Glucose, Bld 91 65 - 99 mg/dL   BUN 17 6 - 20 mg/dL   Creatinine, Ser 1.08 0.61 - 1.24 mg/dL   Calcium 8.8 (L) 8.9 - 10.3 mg/dL   Total Protein 6.9 6.5 - 8.1 g/dL   Albumin 3.8 3.5 - 5.0 g/dL   AST 17 15 - 41 U/L   ALT 18 17 - 63 U/L   Alkaline Phosphatase 88 38 - 126 U/L   Total Bilirubin 0.3 0.3 - 1.2 mg/dL   GFR calc non Af Amer >60 >60 mL/min   GFR calc Af Amer >60 >60 mL/min    Comment: (NOTE) The eGFR has been calculated using the CKD EPI equation. This calculation has not been validated in all clinical situations. eGFR's persistently <60 mL/min signify possible Chronic Kidney Disease.    Anion gap 8 5 - 15    Comment: Performed at [facility]  Amitriptyline level     Status: None    Collection Time: 07/16/15  7:20 PM  Result Value Ref Range   Amitriptyline 148 mcg/L   Nortriptyline-AMITR 102 mcg/L   Amitrip+Nortrip 250 100 - 250 mcg/L    Comment: Performed at [facility]  Glucose, capillary     Status: None   Collection Time: 07/17/15  6:07 AM  Result Value Ref Range   Glucose-Capillary 95 65 - 99 mg/dL  Glucose, capillary     Status: None   Collection Time: 07/17/15 11:59 AM  Result Value Ref Range   Glucose-Capillary 96 65 - 99 mg/dL   Comment 1 Notify RN   Glucose, capillary     Status: Abnormal   Collection Time: 07/17/15  7:00 PM  Result Value Ref Range   Glucose-Capillary 120 (H) 65 - 99 mg/dL  Glucose, capillary     Status: Abnormal   Collection Time: 07/18/15  7:41 AM  Result Value Ref Range   Glucose-Capillary 101 (H) 65 - 99 mg/dL  Glucose, capillary     Status: None   Collection Time: 07/18/15  4:58 PM  Result Value Ref Range   Glucose-Capillary 96 65 - 99 mg/dL   Comment 1 Notify RN    Comment 2 Document in Chart   Glucose, capillary     Status: Abnormal   Collection Time: 07/19/15  6:06 AM  Result Value Ref Range   Glucose-Capillary 107 (H) 65 - 99 mg/dL   Comment 1 Notify RN   Glucose, capillary     Status: Abnormal   Collection Time: 07/19/15  4:55 PM  Result Value Ref Range   Glucose-Capillary 121 (H) 65 - 99 mg/dL  Glucose, capillary     Status: Abnormal   Collection Time: 07/20/15  6:01 AM  Result Value Ref Range   Glucose-Capillary 110 (H) 65 - 99 mg/dL   Comment 1 Notify RN   Glucose, capillary     Status: Abnormal   Collection Time: 07/20/15  5:09 PM  Result Value Ref Range   Glucose-Capillary 144 (H) 65 - 99 mg/dL  Glucose, capillary     Status: None   Collection Time: 07/21/15  6:35 AM  Result Value Ref Range   Glucose-Capillary 98 65 - 99 mg/dL  Glucose, capillary     Status: Abnormal   Collection Time: 07/21/15 11:40 AM  Result Value Ref Range   Glucose-Capillary 123 (H) 65 - 99 mg/dL  Comprehensive  metabolic panel     Status: Abnormal   Collection Time: 07/25/15  9:43 PM  Result Value Ref Range   Sodium 136 135 - 145 mmol/L   Potassium 4.0 3.5 - 5.1 mmol/L   Chloride 104 101 - 111 mmol/L   CO2 22 22 - 32 mmol/L   Glucose, Bld 114 (H) 65 - 99 mg/dL   BUN 8 6 - 20 mg/dL   Creatinine, Ser 0.85 0.61 - 1.24 mg/dL   Calcium 8.9 8.9 - 10.3 mg/dL   Total Protein 6.5 6.5 - 8.1 g/dL   Albumin 3.9 3.5 - 5.0 g/dL   AST 18 15 - 41 U/L   ALT 14 (L) 17 - 63 U/L   Alkaline Phosphatase 85 38 - 126 U/L   Total Bilirubin 0.2 (L) 0.3 - 1.2 mg/dL   GFR calc non Af Amer >60 >60 mL/min   GFR calc Af Amer >60 >60 mL/min    Comment: (NOTE) The eGFR has been calculated using the CKD EPI equation. This calculation has not been validated in all clinical situations. eGFR's persistently <60 mL/min signify possible Chronic Kidney Disease.    Anion gap 10 5 - 15  I-Stat Chem 8, ED  (not at [facility])     Status: Abnormal   Collection Time: 07/25/15 10:08 PM  Result Value Ref Range   Sodium 138 135 - 145 mmol/L   Potassium 4.0 3.5 - 5.1 mmol/L   Chloride 102 101 - 111 mmol/L   BUN 10 6 - 20 mg/dL   Creatinine, Ser 0.80 0.61 - 1.24 mg/dL   Glucose, Bld 107 (H) 65 - 99 mg/dL   Calcium, Ion 1.09 (L) 1.13 - 1.30 mmol/L   TCO2 25 0 - 100 mmol/L   Hemoglobin 15.6 13.0 - 17.0 g/dL   HCT 46.0 39.0 - 52.0 %  I-stat troponin, ED     Status: None   Collection Time: 07/25/15 10:10 PM  Result Value Ref Range   Troponin i, poc 0.01 0.00 - 0.08 ng/mL   Comment 3            Comment: Due to the release kinetics of cTnI, a negative result within the first hours of the onset of symptoms does not rule out myocardial infarction with certainty. If myocardial infarction is still suspected, repeat the test at appropriate intervals.   APTT     Status: Abnormal   Collection Time: 07/25/15 11:00 PM  Result Value Ref Range   aPTT 23 (L) 24 - 37 seconds  Protime-INR     Status: None   Collection Time: 07/25/15 11:00  PM  Result Value Ref Range   Prothrombin Time 13.5 11.6 - 15.2 seconds   INR 1.01 0.00 - 1.49  CBC with Differential     Status: None   Collection Time: 07/25/15 11:00 PM  Result Value Ref Range   WBC 7.4 4.0 - 10.5 K/uL   RBC 4.89 4.22 - 5.81 MIL/uL   Hemoglobin 15.7 13.0 - 17.0 g/dL   HCT 44.7 39.0 - 52.0 %   MCV 91.4 78.0 - 100.0 fL   MCH 32.1 26.0 - 34.0 pg   MCHC 35.1 30.0 - 36.0 g/dL   RDW 14.3 11.5 - 15.5 %   Platelets 223 150 - 400 K/uL   Neutrophils Relative % 70 43 - 77 %   Neutro Abs 5.2 1.7 - 7.7 K/uL   Lymphocytes Relative 23 12 - 46 %   Lymphs Abs 1.7 0.7 - 4.0 K/uL   Monocytes Relative 7 3 - 12 %   Monocytes Absolute 0.5 0.1 - 1.0 K/uL   Eosinophils Relative 0 0 - 5 %   Eosinophils Absolute 0.0 0.0 - 0.7 K/uL   Basophils Relative 0 0 - 1 %   Basophils Absolute 0.0 0.0 - 0.1 K/uL  Urinalysis, Routine w reflex microscopic (not at [facility])     Status: Abnormal   Collection Time: 07/26/15  1:05 AM  Result Value Ref Range   Color, Urine YELLOW YELLOW   APPearance CLOUDY (A) CLEAR   Specific Gravity, Urine 1.007 1.005 - 1.030   pH 6.0 5.0 - 8.0   Glucose, UA NEGATIVE NEGATIVE mg/dL   Hgb urine dipstick NEGATIVE NEGATIVE   Bilirubin Urine NEGATIVE NEGATIVE   Ketones, ur 15 (A) NEGATIVE mg/dL   Protein, ur NEGATIVE NEGATIVE mg/dL   Urobilinogen, UA 0.2 0.0 - 1.0 mg/dL   Nitrite NEGATIVE NEGATIVE   Leukocytes, UA NEGATIVE NEGATIVE    Comment: MICROSCOPIC NOT DONE ON URINES WITH NEGATIVE PROTEIN, BLOOD, LEUKOCYTES, NITRITE, OR GLUCOSE <1000 mg/dL.  Ethanol     Status: None   Collection Time: 07/29/15  3:24 PM  Result Value Ref Range   Alcohol, Ethyl (B) <5 <5 mg/dL    Comment:        LOWEST DETECTABLE  LIMIT FOR SERUM ALCOHOL IS 5 mg/dL FOR MEDICAL PURPOSES ONLY   Protime-INR     Status: None   Collection Time: 07/29/15  3:26 PM  Result Value Ref Range   Prothrombin Time 14.0 11.6 - 15.2 seconds   INR 1.06 0.00 - 1.49  APTT     Status: Abnormal   Collection  Time: 07/29/15  3:26 PM  Result Value Ref Range   aPTT 23 (L) 24 - 37 seconds  CBC     Status: None   Collection Time: 07/29/15  3:26 PM  Result Value Ref Range   WBC 7.4 4.0 - 10.5 K/uL   RBC 4.76 4.22 - 5.81 MIL/uL   Hemoglobin 15.3 13.0 - 17.0 g/dL   HCT 43.8 39.0 - 52.0 %   MCV 92.0 78.0 - 100.0 fL   MCH 32.1 26.0 - 34.0 pg   MCHC 34.9 30.0 - 36.0 g/dL   RDW 14.3 11.5 - 15.5 %   Platelets 195 150 - 400 K/uL  Differential     Status: None   Collection Time: 07/29/15  3:26 PM  Result Value Ref Range   Neutrophils Relative % 73 43 - 77 %   Neutro Abs 5.4 1.7 - 7.7 K/uL   Lymphocytes Relative 19 12 - 46 %   Lymphs Abs 1.4 0.7 - 4.0 K/uL   Monocytes Relative 7 3 - 12 %   Monocytes Absolute 0.5 0.1 - 1.0 K/uL   Eosinophils Relative 1 0 - 5 %   Eosinophils Absolute 0.1 0.0 - 0.7 K/uL   Basophils Relative 0 0 - 1 %   Basophils Absolute 0.0 0.0 - 0.1 K/uL  Comprehensive metabolic panel     Status: Abnormal   Collection Time: 07/29/15  3:26 PM  Result Value Ref Range   Sodium 138 135 - 145 mmol/L   Potassium 4.2 3.5 - 5.1 mmol/L   Chloride 105 101 - 111 mmol/L   CO2 24 22 - 32 mmol/L   Glucose, Bld 133 (H) 65 - 99 mg/dL   BUN 8 6 - 20 mg/dL   Creatinine, Ser 1.06 0.61 - 1.24 mg/dL   Calcium 8.7 (L) 8.9 - 10.3 mg/dL   Total Protein 6.4 (L) 6.5 - 8.1 g/dL   Albumin 3.8 3.5 - 5.0 g/dL   AST 25 15 - 41 U/L   ALT 16 (L) 17 - 63 U/L   Alkaline Phosphatase 82 38 - 126 U/L   Total Bilirubin 0.7 0.3 - 1.2 mg/dL   GFR calc non Af Amer >60 >60 mL/min   GFR calc Af Amer >60 >60 mL/min    Comment: (NOTE) The eGFR has been calculated using the CKD EPI equation. This calculation has not been validated in all clinical situations. eGFR's persistently <60 mL/min signify possible Chronic Kidney Disease.    Anion gap 9 5 - 15  I-stat troponin, ED (not at [facility], [facility])     Status: None   Collection Time: 07/29/15  3:36 PM  Result Value Ref Range   Troponin i, poc 0.01 0.00 - 0.08 ng/mL    Comment 3            Comment: Due to the release kinetics of cTnI, a negative result within the first hours of the onset of symptoms does not rule out myocardial infarction with certainty. If myocardial infarction is still suspected, repeat the test at appropriate intervals.   I-Stat Chem 8, ED  (not at [facility], [facility])     Status:  Abnormal   Collection Time: 07/29/15  3:38 PM  Result Value Ref Range   Sodium 136 135 - 145 mmol/L   Potassium 5.3 (H) 3.5 - 5.1 mmol/L   Chloride 103 101 - 111 mmol/L   BUN 12 6 - 20 mg/dL   Creatinine, Ser 0.90 0.61 - 1.24 mg/dL   Glucose, Bld 128 (H) 65 - 99 mg/dL   Calcium, Ion 1.01 (L) 1.13 - 1.30 mmol/L   TCO2 25 0 - 100 mmol/L   Hemoglobin 16.0 13.0 - 17.0 g/dL   HCT 47.0 39.0 - 52.0 %  Urine rapid drug screen (hosp performed)not at [facility]     Status: Abnormal   Collection Time: 07/29/15  5:08 PM  Result Value Ref Range   Opiates NONE DETECTED NONE DETECTED   Cocaine NONE DETECTED NONE DETECTED   Benzodiazepines POSITIVE (A) NONE DETECTED   Amphetamines NONE DETECTED NONE DETECTED   Tetrahydrocannabinol NONE DETECTED NONE DETECTED   Barbiturates NONE DETECTED NONE DETECTED    Comment:        DRUG SCREEN FOR MEDICAL PURPOSES ONLY.  IF CONFIRMATION IS NEEDED FOR ANY PURPOSE, NOTIFY LAB WITHIN 5 DAYS.        LOWEST DETECTABLE LIMITS FOR URINE DRUG SCREEN Drug Class       Cutoff (ng/mL) Amphetamine      1000 Barbiturate      200 Benzodiazepine   478 Tricyclics       295 Opiates          300 Cocaine          300 THC              50   Urinalysis, Routine w reflex microscopic (not at [facility])     Status: None   Collection Time: 07/29/15  5:08 PM  Result Value Ref Range   Color, Urine YELLOW YELLOW   APPearance CLEAR CLEAR   Specific Gravity, Urine 1.009 1.005 - 1.030   pH 7.0 5.0 - 8.0   Glucose, UA NEGATIVE NEGATIVE mg/dL   Hgb urine dipstick NEGATIVE NEGATIVE   Bilirubin Urine NEGATIVE NEGATIVE   Ketones, ur NEGATIVE NEGATIVE mg/dL    Protein, ur NEGATIVE NEGATIVE mg/dL   Urobilinogen, UA 0.2 0.0 - 1.0 mg/dL   Nitrite NEGATIVE NEGATIVE   Leukocytes, UA NEGATIVE NEGATIVE    Comment: MICROSCOPIC NOT DONE ON URINES WITH NEGATIVE PROTEIN, BLOOD, LEUKOCYTES, NITRITE, OR GLUCOSE <1000 mg/dL.  Hemoglobin A1c     Status: None   Collection Time: 07/30/15  5:50 AM  Result Value Ref Range   Hgb A1c MFr Bld 5.5 4.8 - 5.6 %    Comment: (NOTE)         Pre-diabetes: 5.7 - 6.4         Diabetes: >6.4         Glycemic control for adults with diabetes: <7.0    Mean Plasma Glucose 111 mg/dL    Comment: (NOTE) Performed At: [facility] [place], [place] [id number] Lindon Romp MD QI:[id number]   Lipid panel     Status: None   Collection Time: 07/30/15  5:50 AM  Result Value Ref Range   Cholesterol 125 0 - 200 mg/dL   Triglycerides 107 <150 mg/dL   HDL 42 >40 mg/dL   Total CHOL/HDL Ratio 3.0 RATIO   VLDL 21 0 - 40 mg/dL   LDL Cholesterol 62 0 - 99 mg/dL    Comment:        Total  Cholesterol/HDL:CHD Risk Coronary Heart Disease Risk Table                     Men   Women  1/2 Average Risk   3.4   3.3  Average Risk       5.0   4.4  2 X Average Risk   9.6   7.1  3 X Average Risk  23.4   11.0        Use the calculated Patient Ratio above and the CHD Risk Table to determine the patient's CHD Risk.        ATP III CLASSIFICATION (LDL):  <100     mg/dL   Optimal  100-129  mg/dL   Near or Above                    Optimal  130-159  mg/dL   Borderline  160-189  mg/dL   High  >190     mg/dL   Very High   Basic metabolic panel     Status: Abnormal   Collection Time: 07/30/15  2:49 PM  Result Value Ref Range   Sodium 137 135 - 145 mmol/L   Potassium 4.4 3.5 - 5.1 mmol/L   Chloride 103 101 - 111 mmol/L   CO2 22 22 - 32 mmol/L   Glucose, Bld 96 65 - 99 mg/dL   BUN 8 6 - 20 mg/dL   Creatinine, Ser 0.91 0.61 - 1.24 mg/dL   Calcium 8.3 (L) 8.9 - 10.3 mg/dL   GFR calc non Af Amer >60 >60 mL/min   GFR  calc Af Amer >60 >60 mL/min    Comment: (NOTE) The eGFR has been calculated using the CKD EPI equation. This calculation has not been validated in all clinical situations. eGFR's persistently <60 mL/min signify possible Chronic Kidney Disease.    Anion gap 12 5 - 15  Phenytoin level, total     Status: Abnormal   Collection Time: 07/30/15  7:56 PM  Result Value Ref Range   Phenytoin Lvl 32.2 (HH) 10.0 - 20.0 ug/mL    Comment: CRITICAL RESULT CALLED TO, READ BACK BY AND VERIFIED WITH: TEMBO,D RN 07/30/2015 2129 JORDANS REPEATED TO VERIFY   Amitriptyline level     Status: Abnormal   Collection Time: 07/30/15  7:56 PM  Result Value Ref Range   Amitriptyline 136 mcg/L   Nortriptyline-AMITR 145 mcg/L   Amitrip+Nortrip 281 (H) 100 - 250 mcg/L    Comment: Performed at [facility]  Glucose, capillary     Status: Abnormal   Collection Time: 07/31/15  9:05 PM  Result Value Ref Range   Glucose-Capillary 133 (H) 65 - 99 mg/dL  Phenytoin level, total     Status: Abnormal   Collection Time: 08/01/15  3:28 AM  Result Value Ref Range   Phenytoin Lvl 27.9 (H) 10.0 - 20.0 ug/mL  CBC     Status: None   Collection Time: 08/01/15  3:28 AM  Result Value Ref Range   WBC 7.1 4.0 - 10.5 K/uL   RBC 4.84 4.22 - 5.81 MIL/uL   Hemoglobin 15.6 13.0 - 17.0 g/dL   HCT 45.3 39.0 - 52.0 %   MCV 93.6 78.0 - 100.0 fL   MCH 32.2 26.0 - 34.0 pg   MCHC 34.4 30.0 - 36.0 g/dL   RDW 14.4 11.5 - 15.5 %   Platelets 195 150 - 400 K/uL  Basic metabolic panel     Status:  None   Collection Time: 08/01/15  3:28 AM  Result Value Ref Range   Sodium 138 135 - 145 mmol/L   Potassium 3.8 3.5 - 5.1 mmol/L   Chloride 103 101 - 111 mmol/L   CO2 26 22 - 32 mmol/L   Glucose, Bld 99 65 - 99 mg/dL   BUN 9 6 - 20 mg/dL   Creatinine, Ser 0.89 0.61 - 1.24 mg/dL   Calcium 9.0 8.9 - 10.3 mg/dL   GFR calc non Af Amer >60 >60 mL/min   GFR calc Af Amer >60 >60 mL/min    Comment: (NOTE) The eGFR has been calculated using  the CKD EPI equation. This calculation has not been validated in all clinical situations. eGFR's persistently <60 mL/min signify possible Chronic Kidney Disease.    Anion gap 9 5 - 15  CBC     Status: None   Collection Time: 08/02/15  6:48 AM  Result Value Ref Range   WBC 7.7 4.0 - 10.5 K/uL   RBC 4.92 4.22 - 5.81 MIL/uL   Hemoglobin 15.6 13.0 - 17.0 g/dL   HCT 45.2 39.0 - 52.0 %   MCV 91.9 78.0 - 100.0 fL   MCH 31.7 26.0 - 34.0 pg   MCHC 34.5 30.0 - 36.0 g/dL   RDW 14.5 11.5 - 15.5 %   Platelets 188 150 - 400 K/uL  Basic metabolic panel     Status: Abnormal   Collection Time: 08/02/15  6:48 AM  Result Value Ref Range   Sodium 135 135 - 145 mmol/L   Potassium 4.1 3.5 - 5.1 mmol/L   Chloride 102 101 - 111 mmol/L   CO2 24 22 - 32 mmol/L   Glucose, Bld 97 65 - 99 mg/dL   BUN 9 6 - 20 mg/dL   Creatinine, Ser 0.91 0.61 - 1.24 mg/dL   Calcium 8.8 (L) 8.9 - 10.3 mg/dL   GFR calc non Af Amer >60 >60 mL/min   GFR calc Af Amer >60 >60 mL/min    Comment: (NOTE) The eGFR has been calculated using the CKD EPI equation. This calculation has not been validated in all clinical situations. eGFR's persistently <60 mL/min signify possible Chronic Kidney Disease.    Anion gap 9 5 - 15      Constitutional:  BP 119/77 mmHg  Pulse 92  Ht 5' 11"  (1.803 m)  Wt 192 lb 9.6 oz (87.363 kg)  BMI 26.87 kg/m2   Musculoskeletal: Strength & Muscle Tone: within normal limits Gait & Station: normal Patient leans: N/A  Psychiatric Specialty Exam: General Appearance: Casual  Eye Contact::  Fair  Speech:  Slow  Volume:  Decreased  Mood:  Anxious  Affect:  Congruent  Thought Process:  Coherent  Orientation:  Full (Time, Place, and Person)  Thought Content:  Rumination  Suicidal Thoughts:  No  Homicidal Thoughts:  No  Memory:  Immediate;   Fair Recent;   Fair Remote;   Fair  Judgement:  Fair  Insight:  Good  Psychomotor Activity:  Decreased  Concentration:  Fair  Recall:  [employer]  of Knowledge:  Fair  Language:  Good  Akathisia:  No  Handed:  Right  AIMS (if indicated):     Assets:  Communication Skills Desire for Improvement Financial Resources/Insurance Housing  ADL's:  Intact  Cognition:  Impaired,  Mild  Sleep:        Established Problem, Stable/Improving (1), Review of Psycho-Social Stressors (1), Review or order clinical lab tests (1),  Decision to obtain old records (1), Review and summation of old records (2), Review of Last Therapy Session (1), Review of Medication Regimen & Side Effects (2) and Review of New Medication or Change in Dosage (2)  Assessment: Axis I: Major depressive disorder, recurrent moderate with psychosis .  Rule out schizoaffective disorder depressed type.   Axis II: Deferred  Axis III:  Past Medical History  Diagnosis Date  . Seizures ([place])   . Hypertension   . GERD (gastroesophageal reflux disease)   . Dyslipidemia   . Schizoaffective disorder   . Depression   . Anxiety   . MRSA carrier   . Constipation   . Memory loss   . Hearing loss      Plan:  I review his blood work results, medication, records from his primary care physician.  Patient is concerned about his memory.  He wants to come off from Ashley.  I will discontinue Klonopin.  Start Ativan 1 mg 3 times a day.  Patient is seeing a neurologist in first week of November and I talk to him to consider Lamictal that can help his mood, depression and energy level.  Patient is already taking two seizure controlling medication and I will leave it up to neurologist if he can replace any off his current seizure controlling medicine with Lamictal.  At this time continue Seroquel 600 mg at bedtime and amitriptyline 150 mg at bedtime.  Patient has a poor response with SSRIs in the past.  He also talk about TMS to further explore which could be a possibility to help his depression.  Patient agreed for evaluation.  Discuss safety plan that anytime having active suicidal thoughts  or homicidal thought.  He need to call 911 or go to the local emergency room.  Encouraged to continue therapy with counseling with [name] for coping skills.  Follow-up in 4 weeks.  Shadai Mcclane T., MD 09/27/2015

## 2015-10-05 ENCOUNTER — Ambulatory Visit (HOSPITAL_COMMUNITY): Payer: Self-pay | Admitting: Clinical

## 2015-10-07 ENCOUNTER — Telehealth: Payer: Self-pay | Admitting: Internal Medicine

## 2015-10-07 NOTE — Telephone Encounter (Signed)
Called in attempt to give patient update about nerve conduction study scheduling. Patient has appointment with his neurologist in early November who will determine whether or not to proceed with study.

## 2015-10-13 ENCOUNTER — Other Ambulatory Visit: Payer: Self-pay | Admitting: Family Medicine

## 2015-10-25 ENCOUNTER — Ambulatory Visit (INDEPENDENT_AMBULATORY_CARE_PROVIDER_SITE_OTHER): Payer: Medicaid Other | Admitting: Neurology

## 2015-10-25 ENCOUNTER — Encounter: Payer: Self-pay | Admitting: Neurology

## 2015-10-25 VITALS — BP 107/68 | HR 100 | Ht 71.0 in | Wt 188.0 lb

## 2015-10-25 DIAGNOSIS — G40909 Epilepsy, unspecified, not intractable, without status epilepticus: Secondary | ICD-10-CM | POA: Diagnosis not present

## 2015-10-25 DIAGNOSIS — Z5181 Encounter for therapeutic drug level monitoring: Secondary | ICD-10-CM | POA: Diagnosis not present

## 2015-10-25 DIAGNOSIS — R202 Paresthesia of skin: Secondary | ICD-10-CM | POA: Diagnosis not present

## 2015-10-25 MED ORDER — PHENYTOIN 50 MG PO CHEW: CHEWABLE_TABLET | ORAL | Status: DC

## 2015-10-25 MED ORDER — PHENYTOIN SODIUM EXTENDED 100 MG PO CAPS: ORAL_CAPSULE | ORAL | Status: DC

## 2015-10-25 NOTE — Patient Instructions (Signed)

## 2015-10-25 NOTE — Progress Notes (Signed)
Reason for visit: Seizures  [name] is an 59 y.o. male  History of present illness:  [name] is a 59 year old left-handed white male with a history of schizophrenia and intractable seizures. The patient last had a seizure on 05/30/2015. The patient had some adjustments in his Dilantin dosing, but became toxic on Dilantin by August 2016. The patient will has had a reduction in the dose to a 250 mg daily dosing. The patient has done better with this, he has not had any further seizures. The dizziness problems have improved. His gait stability has gotten better. The patient has developed some numbness in the hands bilaterally, he has started wearing wrist splints with good improvement in the symptoms already. He returns to this office for an evaluation.  Past Medical History  Diagnosis Date  . Seizures ([place])   . Hypertension   . GERD (gastroesophageal reflux disease)   . Dyslipidemia   . Schizoaffective disorder   . Depression   . Anxiety   . MRSA carrier   . Constipation   . Memory loss   . Hearing loss     Past Surgical History  Procedure Laterality Date  . Self orchectomy    . Colonoscopy  2010  . Orif shoulder fracture    . Shoulder closed reduction Left 09/16/2013    Procedure: CLOSED REDUCTION SHOULDER;  Surgeon: Mauri Pole, MD;  Location: [facility];  Service: Orthopedics;  Laterality: Left;  . Shoulder hemi-arthroplasty Left 09/18/2013    Procedure: LEFT SHOULDER HEMI-ARTHROPLASTY;  Surgeon: Augustin Schooling, MD;  Location: [facility];  Service: Orthopedics;  Laterality: Left;  . Carpal tunnel release      Family History  Problem Relation Age of Onset  . Stroke Father     Living at 68  . Stroke Mother     Stroke in late 50's. Died in her 75's  . Alcohol abuse Brother   . Alcohol abuse Brother     Social history:  reports that he has never smoked. He has never used smokeless tobacco. He reports that he does not drink alcohol or use illicit drugs.    Allergies   Allergen Reactions  . Codeine Nausea And Vomiting  . Sulfa Antibiotics Nausea And Vomiting    Medications:  Prior to Admission medications   Medication Sig Start Date End Date Taking? Authorizing Provider  amitriptyline (ELAVIL) 150 MG tablet Take 1 tablet (150 mg total) by mouth at bedtime. 09/27/15  Yes Kathlee Nations, MD  aspirin EC 81 MG tablet Take 1 tablet (81 mg total) by mouth daily. 07/21/15  Yes Benjamine Mola, FNP  Lacosamide (VIMPAT) 150 MG TABS Take 1 tablet (150 mg total) by mouth 2 (two) times daily. 07/21/15  Yes Benjamine Mola, FNP  loratadine (CLARITIN) 10 MG tablet Take 1 tablet (10 mg total) by mouth daily. 07/26/15  Yes Kaitlyn Szekalski, PA-C  LORazepam (ATIVAN) 1 MG tablet Take 1 tablet (1 mg total) by mouth 3 (three) times daily. 09/27/15 09/26/16 Yes Kathlee Nations, MD  meclizine (ANTIVERT) 25 MG tablet Take 25 mg by mouth 3 (three) times daily.  07/26/15  Yes Historical Provider, MD  meclizine (ANTIVERT) 50 MG tablet Take 0.5 tablets (25 mg total) by mouth 3 (three) times daily as needed for dizziness. 07/26/15  Yes Kaitlyn Szekalski, PA-C  metoprolol succinate (TOPROL-XL) 50 MG 24 hr tablet TAKE 1 TABLET (50 MG TOTAL) BY MOUTH AT BEDTIME. TAKE WITH OR IMMEDIATELY FOLLOWING A MEAL.  09/01/15  Yes Leeanne Rio, MD  phenytoin (DILANTIN INFATABS) 50 MG tablet Take one 50mg  tab at night along with two 100mg  capsules for a total of 250mg  at night. 08/04/15  Yes Leeanne Rio, MD  phenytoin (DILANTIN) 100 MG ER capsule Take 2 capsules at night, along with a 50mg  tablet at night for a total of 250mg  at night. 08/03/15  Yes Leeanne Rio, MD  polyethylene glycol [facility] / Floria Raveling) packet Take 17 g by mouth 2 (two) times daily as needed. For constipation. Mix into 8 ounces of fluid & drink Patient taking differently: Take 17 g by mouth 2 (two) times daily as needed (constipation). . Mix into 8 ounces of fluid & drink 12/31/14  Yes Leeanne Rio, MD  QUEtiapine  (SEROQUEL) 300 MG tablet Take 2 tablets (600 mg total) by mouth at bedtime. 09/27/15  Yes Kathlee Nations, MD  Selenium Sulf-Pyrithione-Urea 2.25 % SHAM APPLY TO AFFECTED AREA ON SCALP 3 TO 6 TIMES PER WEEK 10/13/15  Yes Alveda Reasons, MD  senna ([employer] SENNA) 8.6 MG tablet Take 1 tablet (8.6 mg total) by mouth daily as needed for constipation. 09/19/15  Yes Leeanne Rio, MD  senna-docusate ([employer] SENNA PLUS) 8.6-50 MG per tablet Take 1 tablet by mouth at bedtime as needed for mild constipation. 09/01/15  Yes Leeanne Rio, MD  simvastatin (ZOCOR) 40 MG tablet TAKE 1 TABLET (40 MG TOTAL) BY MOUTH AT BEDTIME. 09/28/15  Yes Leeanne Rio, MD  testosterone cypionate (DEPOTESTOSTERONE CYPIONATE) 200 MG/ML injection Inject 2 mLs (400 mg total) into the muscle every 14 (fourteen) days. 09/16/15  Yes Hillary Corinda Gubler, MD    ROS:  Out of a complete 14 system review of symptoms, the patient complains only of the following symptoms, and all other reviewed systems are negative.  History seizures Hand numbness  Blood pressure 107/68, pulse 100, height 5\' 11"  (1.803 m), weight 188 lb (85.276 kg).  Physical Exam  General: The patient is alert and cooperative at the time of the examination.  Skin: No significant peripheral edema is noted.   Neurologic Exam  Mental status: The patient is alert and oriented x 3 at the time of the examination. The patient has apparent normal recent and remote memory, with an apparently normal attention span and concentration ability.   Cranial nerves: Facial symmetry is present. Speech is normal, no aphasia or dysarthria is noted. Extraocular movements are full. Visual fields are full.  Motor: The patient has good strength in all 4 extremities.  Sensory examination: Soft touch sensation is symmetric on the face, arms, and legs.  Coordination: The patient has good finger-nose-finger and heel-to-shin bilaterally. There is some apraxia with the use  of the lower extremities.  Gait and station: The patient has a normal gait. Tandem gait is slightly unsteady. Romberg is negative. No drift is seen.  Reflexes: Deep tendon reflexes are symmetric.   Assessment/Plan:  1. History of intractable seizures  2. Hand numbness, paresthesias, possible carpal tunnel syndrome  The patient is improving with his symptoms on his hands, we will follow this conservatively, if symptoms worsen, we will get nerve conduction study evaluation. The patient doing better with the seizures, we will check blood work today, prescriptions were sent for his Dilantin. He will follow-up in 4 months, sooner if needed.  [name] MD 10/25/2015 10:16 AM  [facility] [address] [place],  [address]  Phone [phone] Fax [phone]

## 2015-10-26 ENCOUNTER — Telehealth: Payer: Self-pay

## 2015-10-26 LAB — CBC WITH DIFFERENTIAL/PLATELET
Basophils Absolute: 0 10*3/uL (ref 0.0–0.2)
Basos: 0 %
EOS (ABSOLUTE): 0.2 10*3/uL (ref 0.0–0.4)
Eos: 3 %
Hematocrit: 43.2 % (ref 37.5–51.0)
Hemoglobin: 14.7 g/dL (ref 12.6–17.7)
Immature Grans (Abs): 0 10*3/uL (ref 0.0–0.1)
Immature Granulocytes: 0 %
Lymphocytes Absolute: 1.3 10*3/uL (ref 0.7–3.1)
Lymphs: 18 %
MCH: 29.9 pg (ref 26.6–33.0)
MCHC: 34 g/dL (ref 31.5–35.7)
MCV: 88 fL (ref 79–97)
Monocytes Absolute: 0.5 10*3/uL (ref 0.1–0.9)
Monocytes: 7 %
Neutrophils Absolute: 5.2 10*3/uL (ref 1.4–7.0)
Neutrophils: 72 %
Platelets: 216 10*3/uL (ref 150–379)
RBC: 4.92 x10E6/uL (ref 4.14–5.80)
RDW: 15.5 % — ABNORMAL HIGH (ref 12.3–15.4)
WBC: 7.2 10*3/uL (ref 3.4–10.8)

## 2015-10-26 LAB — COMPREHENSIVE METABOLIC PANEL
ALT: 17 IU/L (ref 0–44)
AST: 22 IU/L (ref 0–40)
Albumin/Globulin Ratio: 1.7 (ref 1.1–2.5)
Albumin: 4.2 g/dL (ref 3.6–4.8)
Alkaline Phosphatase: 91 IU/L (ref 39–117)
BUN/Creatinine Ratio: 19 (ref 10–22)
BUN: 18 mg/dL (ref 8–27)
Bilirubin Total: <0.2 mg/dL (ref 0.0–1.2)
CO2: 14 mmol/L — ABNORMAL LOW (ref 18–29)
Calcium: 8.9 mg/dL (ref 8.6–10.2)
Chloride: 101 mmol/L (ref 97–106)
Creatinine, Ser: 0.95 mg/dL (ref 0.76–1.27)
GFR calc Af Amer: 99 mL/min/{1.73_m2} (ref >59)
GFR calc non Af Amer: 86 mL/min/{1.73_m2} (ref >59)
Globulin, Total: 2.5 g/dL (ref 1.5–4.5)
Glucose: 130 mg/dL — ABNORMAL HIGH (ref 65–99)
Potassium: 4.1 mmol/L (ref 3.5–5.2)
Sodium: 138 mmol/L (ref 136–144)
Total Protein: 6.7 g/dL (ref 6.0–8.5)

## 2015-10-26 LAB — PHENYTOIN LEVEL, TOTAL: Phenytoin (Dilantin), Serum: 12.9 ug/mL (ref 10.0–20.0)

## 2015-10-26 NOTE — Telephone Encounter (Signed)
-----   Message from Kathrynn Ducking, MD sent at 10/26/2015  4:57 PM EST ----- Blood work is relatively unremarkable, Dilantin level is in therapeutic range, no change in dosing. Please call the patient. ----- Message -----    From: [facility] Lab Results In Interface    Sent: 10/26/2015   7:42 AM      To: Kathrynn Ducking, MD

## 2015-10-26 NOTE — Telephone Encounter (Signed)
Pt was advised that his blood work was unremarkable, dilantin level was in therapeutic range, so no change in dosing needed. Pt verbalized understanding.

## 2015-10-27 ENCOUNTER — Ambulatory Visit (INDEPENDENT_AMBULATORY_CARE_PROVIDER_SITE_OTHER): Payer: Medicaid Other | Admitting: Psychiatry

## 2015-10-27 ENCOUNTER — Encounter (HOSPITAL_COMMUNITY): Payer: Self-pay | Admitting: Psychiatry

## 2015-10-27 VITALS — BP 135/83 | HR 89 | Ht 71.0 in | Wt 194.0 lb

## 2015-10-27 DIAGNOSIS — F251 Schizoaffective disorder, depressive type: Secondary | ICD-10-CM | POA: Diagnosis not present

## 2015-10-27 MED ORDER — QUETIAPINE FUMARATE 300 MG PO TABS: 600.0000 mg | ORAL_TABLET | Freq: Every day | ORAL | Status: DC

## 2015-10-27 MED ORDER — GABAPENTIN 100 MG PO CAPS: 100.0000 mg | ORAL_CAPSULE | Freq: Two times a day (BID) | ORAL | Status: DC

## 2015-10-27 MED ORDER — CLONAZEPAM 1 MG PO TABS: 1.0000 mg | ORAL_TABLET | Freq: Three times a day (TID) | ORAL | Status: DC

## 2015-10-27 MED ORDER — AMITRIPTYLINE HCL 150 MG PO TABS: 150.0000 mg | ORAL_TABLET | Freq: Every day | ORAL | Status: DC

## 2015-10-27 NOTE — Progress Notes (Signed)
[facility] [phone] Progress Note  [name] [id number] y.o.  10/27/2015 2:14 PM  Chief Complaint:  I want to go back on Klonopin.  I don't think Ativan helping me.  I have a lot of anxiety.  I have no energy.      History of Present Illness:  [name] came for his follow-up appointment.  [name] was seen 2 months ago.   [name] is taking his medication as prescribed.  [name] is taking Seroquel, amitriptyline and Ativan.  However [name] still have anxiety and paranoia.  [name] continued to believe that his brother had hired attorney to get him evicted.  Though [name] does not have any evidence for that but [name] admitted a lot of anticipated anxiety.  [name] also gets episodes of confusion when [name] does not remember very well.  Patient has memory impairment which could be due to ECT treatment in the past.  His brother lives in [place] And patient continued to believe that [name] is a spying on him.  [name] does not leave his house unless it is important.  [name] admitted been withdrawn isolated and having difficulty doing multitasking.  However [name] denies any suicidal thoughts, aggressive behavior, auditory hallucination.  [name] saw a neurologist but forgot to discuss Lamictal.  [name] has numbness and [name] may get conduction study test.  [name] like to go back on Klonopin which helped him in the past.  However [name] was taking Klonopin 2 mg 4 times a day and [name] admitted it was very sedating.  Patient has history of taking a very high dose of psychiatric medication.  In the past she had taken amitriptyline 600 mg and Seroquel 9 her milligram at bedtime.  His appetite is okay.  Patient lives by himself.  [name] is glad that [name] has no more seizures since 7 months.  [name] is compliant with Dilantin which is therapeutic.  Patient has seen once [facility] and admitted not scheduled appointment for future appointments.  Suicidal Ideation: No Plan Formed: No Patient has means to carry out plan: No  Homicidal Ideation: No Plan Formed: No Patient has means to carry  out plan: No  Past Psychiatric History/Hospitalization(s): Patient has severe depression since his college.  [name]'s been taking medication since 1984.  [name] has at least 15 psychiatric hospitalization due to severe depression, paranoia and suicidal thoughts.  His last psychiatric hospitalization was at behavioral Center where [name] was admitted from July 27 to 07/21/2015.  [name] was thinking to asphyxiate on helium.  At that time [name] was taking Seroquel 900 mg at bedtime, amitriptyline 400 mg at bedtime and Klonopin 2 mg 4 times a day.  [name] had one history of suicidal attempt when [name] cut his testicle because [name] does not want to be depressed.  [name] admitted history of paranoia and negative thinking.  [name] was seeing at [name] at [facility] however it closed [name] moved to [name] to continue with [name].  [name] had tried numerous SSRIs however [name] always had opposite reaction with the SSRI.  [name] felt poor sleep, restlessness, irritability and unable to tolerate SSRIs.  In the past she had tried nortriptyline, Haldol, Geodon, Risperdal, lithium, Prozac, Wellbutrin , Paxil, Lexapro and Zoloft.  [name] denies any history of mania.  Patient denies any history of drugs or alcohol use. Anxiety: No Bipolar Disorder: No Depression: Yes Mania: No Psychosis: Paranoia and negative thinking Schizophrenia: No Personality Disorder: No Hospitalization for psychiatric illness: No History of Electroconvulsive Shock  Therapy: No Prior Suicide Attempts: Yes  Medical History; Patient has hypertension, GERD, hyperlipidemia, mild memory impairment, seizure disorder.  His primary care physician is [facility]. Patient also goes to [facility] neurology for seizures management.  Family History; Patient endorse uncle had severe depression and committed suicide by killing himself  Education and Work History; Patient was in the college when [name] had severe depression and [name] has to leave the college.  [name] is on  disability.  Psychosocial History; Patient born and raised in [place].  [name] never married.  [name] has no children.  [name] has a good friend who [name] know for 15 years and very supportive.  His parents are deceased.  His father died 3 years ago.  [name] has one brother who live in [place].  Review of Systems  Constitutional: Negative.   Cardiovascular: Negative for chest pain and palpitations.  Musculoskeletal: Negative.   Skin: Negative for itching and rash.  Neurological: Positive for tingling. Negative for dizziness, tremors and headaches.       Numbness in his both hands  Psychiatric/Behavioral: Positive for memory loss. The patient is nervous/anxious.     Psychiatric: Agitation: No Hallucination: No Depressed Mood: Yes Insomnia: No Hypersomnia: No Altered Concentration: No Feels Worthless: No Grandiose Ideas: No Belief In Special Powers: No New/Increased Substance Abuse: No Compulsions: No  Neurologic: Headache: No Seizure: History of seizures Paresthesias: Yes   Outpatient Encounter Prescriptions as of 10/27/2015  Medication Sig  . amitriptyline (ELAVIL) 150 MG tablet Take 1 tablet (150 mg total) by mouth at bedtime.  Marland Kitchen aspirin EC 81 MG tablet Take 1 tablet (81 mg total) by mouth daily.  . clonazePAM (KLONOPIN) 1 MG tablet Take 1 tablet (1 mg total) by mouth 3 (three) times daily.  Marland Kitchen gabapentin (NEURONTIN) 100 MG capsule Take 1 capsule (100 mg total) by mouth 2 (two) times daily.  . Lacosamide (VIMPAT) 150 MG TABS Take 1 tablet (150 mg total) by mouth 2 (two) times daily.  Marland Kitchen loratadine (CLARITIN) 10 MG tablet Take 1 tablet (10 mg total) by mouth daily.  . meclizine (ANTIVERT) 25 MG tablet Take 25 mg by mouth 3 (three) times daily.   . meclizine (ANTIVERT) 50 MG tablet Take 0.5 tablets (25 mg total) by mouth 3 (three) times daily as needed for dizziness.  . metoprolol succinate (TOPROL-XL) 50 MG 24 hr tablet TAKE 1 TABLET (50 MG TOTAL) BY MOUTH AT BEDTIME. TAKE WITH OR  IMMEDIATELY FOLLOWING A MEAL.  Marland Kitchen phenytoin (DILANTIN INFATABS) 50 MG tablet One tablet at night  . phenytoin (DILANTIN) 100 MG ER capsule Take 2 capsules at night  . polyethylene glycol (MIRALAX / GLYCOLAX) packet Take 17 g by mouth 2 (two) times daily as needed. For constipation. Mix into 8 ounces of fluid & drink (Patient taking differently: Take 17 g by mouth 2 (two) times daily as needed (constipation). . Mix into 8 ounces of fluid & drink)  . QUEtiapine (SEROQUEL) 300 MG tablet Take 2 tablets (600 mg total) by mouth at bedtime.  . Selenium Sulf-Pyrithione-Urea 2.25 % SHAM APPLY TO AFFECTED AREA ON SCALP 3 TO 6 TIMES PER WEEK  . senna ([employer] SENNA) 8.6 MG tablet Take 1 tablet (8.6 mg total) by mouth daily as needed for constipation.  . senna-docusate (CVS SENNA PLUS) 8.6-50 MG per tablet Take 1 tablet by mouth at bedtime as needed for mild constipation.  . simvastatin (ZOCOR) 40 MG tablet TAKE 1 TABLET (40 MG TOTAL) BY MOUTH AT BEDTIME.  Marland Kitchen  testosterone cypionate (DEPOTESTOSTERONE CYPIONATE) 200 MG/ML injection Inject 2 mLs (400 mg total) into the muscle every 14 (fourteen) days.  . [DISCONTINUED] amitriptyline (ELAVIL) 150 MG tablet Take 1 tablet (150 mg total) by mouth at bedtime.  . [DISCONTINUED] LORazepam (ATIVAN) 1 MG tablet Take 1 tablet (1 mg total) by mouth 3 (three) times daily.  . [DISCONTINUED] QUEtiapine (SEROQUEL) 300 MG tablet Take 2 tablets (600 mg total) by mouth at bedtime.   No facility-administered encounter medications on file as of 10/27/2015.    Recent Results (from the past 2160 hour(s))  Ethanol     Status: None   Collection Time: 07/29/15  3:24 PM  Result Value Ref Range   Alcohol, Ethyl (B) <5 <5 mg/dL    Comment:        LOWEST DETECTABLE LIMIT FOR SERUM ALCOHOL IS 5 mg/dL FOR MEDICAL PURPOSES ONLY   Protime-INR     Status: None   Collection Time: 07/29/15  3:26 PM  Result Value Ref Range   Prothrombin Time 14.0 11.6 - 15.2 seconds   INR 1.06 0.00 - 1.49   APTT     Status: Abnormal   Collection Time: 07/29/15  3:26 PM  Result Value Ref Range   aPTT 23 (L) 24 - 37 seconds  CBC     Status: None   Collection Time: 07/29/15  3:26 PM  Result Value Ref Range   WBC 7.4 4.0 - 10.5 K/uL   RBC 4.76 4.22 - 5.81 MIL/uL   Hemoglobin 15.3 13.0 - 17.0 g/dL   HCT 43.8 39.0 - 52.0 %   MCV 92.0 78.0 - 100.0 fL   MCH 32.1 26.0 - 34.0 pg   MCHC 34.9 30.0 - 36.0 g/dL   RDW 14.3 11.5 - 15.5 %   Platelets 195 150 - 400 K/uL  Differential     Status: None   Collection Time: 07/29/15  3:26 PM  Result Value Ref Range   Neutrophils Relative % 73 43 - 77 %   Neutro Abs 5.4 1.7 - 7.7 K/uL   Lymphocytes Relative 19 12 - 46 %   Lymphs Abs 1.4 0.7 - 4.0 K/uL   Monocytes Relative 7 3 - 12 %   Monocytes Absolute 0.5 0.1 - 1.0 K/uL   Eosinophils Relative 1 0 - 5 %   Eosinophils Absolute 0.1 0.0 - 0.7 K/uL   Basophils Relative 0 0 - 1 %   Basophils Absolute 0.0 0.0 - 0.1 K/uL  Comprehensive metabolic panel     Status: Abnormal   Collection Time: 07/29/15  3:26 PM  Result Value Ref Range   Sodium 138 135 - 145 mmol/L   Potassium 4.2 3.5 - 5.1 mmol/L   Chloride 105 101 - 111 mmol/L   CO2 24 22 - 32 mmol/L   Glucose, Bld 133 (H) 65 - 99 mg/dL   BUN 8 6 - 20 mg/dL   Creatinine, Ser 1.06 0.61 - 1.24 mg/dL   Calcium 8.7 (L) 8.9 - 10.3 mg/dL   Total Protein 6.4 (L) 6.5 - 8.1 g/dL   Albumin 3.8 3.5 - 5.0 g/dL   AST 25 15 - 41 U/L   ALT 16 (L) 17 - 63 U/L   Alkaline Phosphatase 82 38 - 126 U/L   Total Bilirubin 0.7 0.3 - 1.2 mg/dL   GFR calc non Af Amer >60 >60 mL/min   GFR calc Af Amer >60 >60 mL/min    Comment: (NOTE) The eGFR has been calculated using the CKD  EPI equation. This calculation has not been validated in all clinical situations. eGFR's persistently <60 mL/min signify possible Chronic Kidney Disease.    Anion gap 9 5 - 15  I-stat troponin, ED (not at [facility], [facility])     Status: None   Collection Time: 07/29/15  3:36 PM  Result Value Ref Range    Troponin i, poc 0.01 0.00 - 0.08 ng/mL   Comment 3            Comment: Due to the release kinetics of cTnI, a negative result within the first hours of the onset of symptoms does not rule out myocardial infarction with certainty. If myocardial infarction is still suspected, repeat the test at appropriate intervals.   I-Stat Chem 8, ED  (not at [facility], [facility])     Status: Abnormal   Collection Time: 07/29/15  3:38 PM  Result Value Ref Range   Sodium 136 135 - 145 mmol/L   Potassium 5.3 (H) 3.5 - 5.1 mmol/L   Chloride 103 101 - 111 mmol/L   BUN 12 6 - 20 mg/dL   Creatinine, Ser 0.90 0.61 - 1.24 mg/dL   Glucose, Bld 128 (H) 65 - 99 mg/dL   Calcium, Ion 1.01 (L) 1.13 - 1.30 mmol/L   TCO2 25 0 - 100 mmol/L   Hemoglobin 16.0 13.0 - 17.0 g/dL   HCT 47.0 39.0 - 52.0 %  Urine rapid drug screen (hosp performed)not at [facility]     Status: Abnormal   Collection Time: 07/29/15  5:08 PM  Result Value Ref Range   Opiates NONE DETECTED NONE DETECTED   Cocaine NONE DETECTED NONE DETECTED   Benzodiazepines POSITIVE (A) NONE DETECTED   Amphetamines NONE DETECTED NONE DETECTED   Tetrahydrocannabinol NONE DETECTED NONE DETECTED   Barbiturates NONE DETECTED NONE DETECTED    Comment:        DRUG SCREEN FOR MEDICAL PURPOSES ONLY.  IF CONFIRMATION IS NEEDED FOR ANY PURPOSE, NOTIFY LAB WITHIN 5 DAYS.        LOWEST DETECTABLE LIMITS FOR URINE DRUG SCREEN Drug Class       Cutoff (ng/mL) Amphetamine      1000 Barbiturate      200 Benzodiazepine   785 Tricyclics       885 Opiates          300 Cocaine          300 THC              50   Urinalysis, Routine w reflex microscopic (not at [facility])     Status: None   Collection Time: 07/29/15  5:08 PM  Result Value Ref Range   Color, Urine YELLOW YELLOW   APPearance CLEAR CLEAR   Specific Gravity, Urine 1.009 1.005 - 1.030   pH 7.0 5.0 - 8.0   Glucose, UA NEGATIVE NEGATIVE mg/dL   Hgb urine dipstick NEGATIVE NEGATIVE   Bilirubin Urine NEGATIVE NEGATIVE    Ketones, ur NEGATIVE NEGATIVE mg/dL   Protein, ur NEGATIVE NEGATIVE mg/dL   Urobilinogen, UA 0.2 0.0 - 1.0 mg/dL   Nitrite NEGATIVE NEGATIVE   Leukocytes, UA NEGATIVE NEGATIVE    Comment: MICROSCOPIC NOT DONE ON URINES WITH NEGATIVE PROTEIN, BLOOD, LEUKOCYTES, NITRITE, OR GLUCOSE <1000 mg/dL.  Hemoglobin A1c     Status: None   Collection Time: 07/30/15  5:50 AM  Result Value Ref Range   Hgb A1c MFr Bld 5.5 4.8 - 5.6 %    Comment: (NOTE)  Pre-diabetes: 5.7 - 6.4         Diabetes: >6.4         Glycemic control for adults with diabetes: <7.0    Mean Plasma Glucose 111 mg/dL    Comment: (NOTE) Performed At: [facility] [place], [place] [id number] Lindon Romp MD MO:[id number]   Lipid panel     Status: None   Collection Time: 07/30/15  5:50 AM  Result Value Ref Range   Cholesterol 125 0 - 200 mg/dL   Triglycerides 107 <150 mg/dL   HDL 42 >40 mg/dL   Total CHOL/HDL Ratio 3.0 RATIO   VLDL 21 0 - 40 mg/dL   LDL Cholesterol 62 0 - 99 mg/dL    Comment:        Total Cholesterol/HDL:CHD Risk Coronary Heart Disease Risk Table                     Men   Women  1/2 Average Risk   3.4   3.3  Average Risk       5.0   4.4  2 X Average Risk   9.6   7.1  3 X Average Risk  23.4   11.0        Use the calculated Patient Ratio above and the CHD Risk Table to determine the patient's CHD Risk.        ATP III CLASSIFICATION (LDL):  <100     mg/dL   Optimal  100-129  mg/dL   Near or Above                    Optimal  130-159  mg/dL   Borderline  160-189  mg/dL   High  >190     mg/dL   Very High   Basic metabolic panel     Status: Abnormal   Collection Time: 07/30/15  2:49 PM  Result Value Ref Range   Sodium 137 135 - 145 mmol/L   Potassium 4.4 3.5 - 5.1 mmol/L   Chloride 103 101 - 111 mmol/L   CO2 22 22 - 32 mmol/L   Glucose, Bld 96 65 - 99 mg/dL   BUN 8 6 - 20 mg/dL   Creatinine, Ser 0.91 0.61 - 1.24 mg/dL   Calcium 8.3 (L) 8.9 - 10.3 mg/dL   GFR  calc non Af Amer >60 >60 mL/min   GFR calc Af Amer >60 >60 mL/min    Comment: (NOTE) The eGFR has been calculated using the CKD EPI equation. This calculation has not been validated in all clinical situations. eGFR's persistently <60 mL/min signify possible Chronic Kidney Disease.    Anion gap 12 5 - 15  Phenytoin level, total     Status: Abnormal   Collection Time: 07/30/15  7:56 PM  Result Value Ref Range   Phenytoin Lvl 32.2 (HH) 10.0 - 20.0 ug/mL    Comment: CRITICAL RESULT CALLED TO, READ BACK BY AND VERIFIED WITH: TEMBO,D RN 07/30/2015 2129 JORDANS REPEATED TO VERIFY   Amitriptyline level     Status: Abnormal   Collection Time: 07/30/15  7:56 PM  Result Value Ref Range   Amitriptyline 136 mcg/L   Nortriptyline-AMITR 145 mcg/L   Amitrip+Nortrip 281 (H) 100 - 250 mcg/L    Comment: Performed at [facility]  Glucose, capillary     Status: Abnormal   Collection Time: 07/31/15  9:05 PM  Result Value Ref Range   Glucose-Capillary 133 (H) 65 -  99 mg/dL  Phenytoin level, total     Status: Abnormal   Collection Time: 08/01/15  3:28 AM  Result Value Ref Range   Phenytoin Lvl 27.9 (H) 10.0 - 20.0 ug/mL  CBC     Status: None   Collection Time: 08/01/15  3:28 AM  Result Value Ref Range   WBC 7.1 4.0 - 10.5 K/uL   RBC 4.84 4.22 - 5.81 MIL/uL   Hemoglobin 15.6 13.0 - 17.0 g/dL   HCT 45.3 39.0 - 52.0 %   MCV 93.6 78.0 - 100.0 fL   MCH 32.2 26.0 - 34.0 pg   MCHC 34.4 30.0 - 36.0 g/dL   RDW 14.4 11.5 - 15.5 %   Platelets 195 150 - 400 K/uL  Basic metabolic panel     Status: None   Collection Time: 08/01/15  3:28 AM  Result Value Ref Range   Sodium 138 135 - 145 mmol/L   Potassium 3.8 3.5 - 5.1 mmol/L   Chloride 103 101 - 111 mmol/L   CO2 26 22 - 32 mmol/L   Glucose, Bld 99 65 - 99 mg/dL   BUN 9 6 - 20 mg/dL   Creatinine, Ser 0.89 0.61 - 1.24 mg/dL   Calcium 9.0 8.9 - 10.3 mg/dL   GFR calc non Af Amer >60 >60 mL/min   GFR calc Af Amer >60 >60 mL/min    Comment:  (NOTE) The eGFR has been calculated using the CKD EPI equation. This calculation has not been validated in all clinical situations. eGFR's persistently <60 mL/min signify possible Chronic Kidney Disease.    Anion gap 9 5 - 15  CBC     Status: None   Collection Time: 08/02/15  6:48 AM  Result Value Ref Range   WBC 7.7 4.0 - 10.5 K/uL   RBC 4.92 4.22 - 5.81 MIL/uL   Hemoglobin 15.6 13.0 - 17.0 g/dL   HCT 45.2 39.0 - 52.0 %   MCV 91.9 78.0 - 100.0 fL   MCH 31.7 26.0 - 34.0 pg   MCHC 34.5 30.0 - 36.0 g/dL   RDW 14.5 11.5 - 15.5 %   Platelets 188 150 - 400 K/uL  Basic metabolic panel     Status: Abnormal   Collection Time: 08/02/15  6:48 AM  Result Value Ref Range   Sodium 135 135 - 145 mmol/L   Potassium 4.1 3.5 - 5.1 mmol/L   Chloride 102 101 - 111 mmol/L   CO2 24 22 - 32 mmol/L   Glucose, Bld 97 65 - 99 mg/dL   BUN 9 6 - 20 mg/dL   Creatinine, Ser 0.91 0.61 - 1.24 mg/dL   Calcium 8.8 (L) 8.9 - 10.3 mg/dL   GFR calc non Af Amer >60 >60 mL/min   GFR calc Af Amer >60 >60 mL/min    Comment: (NOTE) The eGFR has been calculated using the CKD EPI equation. This calculation has not been validated in all clinical situations. eGFR's persistently <60 mL/min signify possible Chronic Kidney Disease.    Anion gap 9 5 - 15  Comprehensive metabolic panel     Status: Abnormal   Collection Time: 10/25/15  9:18 AM  Result Value Ref Range   Glucose 130 (H) 65 - 99 mg/dL    Comment: Specimen received in contact with cells. No visible hemolysis present. However GLUC may be decreased and K increased. Clinical correlation indicated.    BUN 18 8 - 27 mg/dL   Creatinine, Ser 0.95 0.76 - 1.27 mg/dL  GFR calc non Af Amer 86 >59 mL/min/1.73   GFR calc Af Amer 99 >59 mL/min/1.73   BUN/Creatinine Ratio 19 10 - 22   Sodium 138 136 - 144 mmol/L   Potassium 4.1 3.5 - 5.2 mmol/L   Chloride 101 97 - 106 mmol/L   CO2 14 (L) 18 - 29 mmol/L    Comment: **Result Repeated**   Calcium 8.9 8.6 - 10.2  mg/dL   Total Protein 6.7 6.0 - 8.5 g/dL   Albumin 4.2 3.6 - 4.8 g/dL   Globulin, Total 2.5 1.5 - 4.5 g/dL   Albumin/Globulin Ratio 1.7 1.1 - 2.5   Bilirubin Total <0.2 0.0 - 1.2 mg/dL   Alkaline Phosphatase 91 39 - 117 IU/L   AST 22 0 - 40 IU/L   ALT 17 0 - 44 IU/L  CBC with Differential/Platelet     Status: Abnormal   Collection Time: 10/25/15  9:18 AM  Result Value Ref Range   WBC 7.2 3.4 - 10.8 x10E3/uL   RBC 4.92 4.14 - 5.80 x10E6/uL   Hemoglobin 14.7 12.6 - 17.7 g/dL   Hematocrit 43.2 37.5 - 51.0 %   MCV 88 79 - 97 fL   MCH 29.9 26.6 - 33.0 pg   MCHC 34.0 31.5 - 35.7 g/dL   RDW 15.5 (H) 12.3 - 15.4 %   Platelets 216 150 - 379 x10E3/uL   Neutrophils 72 %   Lymphs 18 %   Monocytes 7 %   Eos 3 %   Basos 0 %   Neutrophils Absolute 5.2 1.4 - 7.0 x10E3/uL   Lymphocytes Absolute 1.3 0.7 - 3.1 x10E3/uL   Monocytes Absolute 0.5 0.1 - 0.9 x10E3/uL   EOS (ABSOLUTE) 0.2 0.0 - 0.4 x10E3/uL   Basophils Absolute 0.0 0.0 - 0.2 x10E3/uL   Immature Granulocytes 0 %   Immature Grans (Abs) 0.0 0.0 - 0.1 x10E3/uL  Phenytoin level, total     Status: None   Collection Time: 10/25/15  9:18 AM  Result Value Ref Range   Phenytoin (Dilantin), Serum 12.9 10.0 - 20.0 ug/mL    Comment:                                 Neonatal:                                 Therapeutic 6.0 - 14.0                                 Detection Limit =  0.8                           <0.8 Indicates None Detected       Constitutional:  BP 135/83 mmHg  Pulse 89  Ht 5' 11"  (1.803 m)  Wt 194 lb (87.998 kg)  BMI 27.07 kg/m2   Musculoskeletal: Strength & Muscle Tone: within normal limits Gait & Station: normal Patient leans: N/A  Psychiatric Specialty Exam: General Appearance: Casual  Eye Contact::  Fair  Speech:  Slow  Volume:  Decreased  Mood:  Anxious  Affect:  Congruent  Thought Process:  Coherent  Orientation:  Full (Time, Place, and Person)  Thought Content:  Rumination  Suicidal Thoughts:  No   Homicidal Thoughts:  No  Memory:  Immediate;   Fair Recent;   Fair Remote;   Fair  Judgement:  Fair  Insight:  Good  Psychomotor Activity:  Decreased  Concentration:  Fair  Recall:  [employer] of Knowledge:  Fair  Language:  Good  Akathisia:  No  Handed:  Right  AIMS (if indicated):     Assets:  Communication Skills Desire for Improvement Financial Resources/Insurance Housing  ADL's:  Intact  Cognition:  Impaired,  Mild  Sleep:        Established Problem, Stable/Improving (1), Review of Psycho-Social Stressors (1), Review or order clinical lab tests (1), Review and summation of old records (2), Established Problem, Worsening (2), Review of Last Therapy Session (1), Review of Medication Regimen & Side Effects (2) and Review of New Medication or Change in Dosage (2)  Assessment: Axis I: Major depressive disorder, recurrent moderate with psychosis .  Rule out schizoaffective disorder depressed type.   Axis II: Deferred  Axis III:  Past Medical History  Diagnosis Date  . Seizures ([place])   . Hypertension   . GERD (gastroesophageal reflux disease)   . Dyslipidemia   . Schizoaffective disorder   . Depression   . Anxiety   . MRSA carrier   . Constipation   . Memory loss   . Hearing loss      Plan:  I review his blood work results, medication, records from his  neurologist.  His Dilantin level is therapeutic.  I will discontinue Ativan and we will try Klonopin 1 mg up to 3 times a day.  I explained that it is at moderate dose and should not take more than that to avoid any sedation .  Patient has history of memory impairment and further increase in benzodiazepine may cause worsening of memory impairment.  I do believe patient has paranoia and anxiety and is strongly encouraged to see therapist for coping and social skills.  Patient promised to see Tharon Aquas discuss his paranoia and anxiety.  I also suggested to try gabapentin 100 mg twice a day if Klonopin alone does not help  his anxiety.  I will continue amitriptyline and Seroquel at present dose.  [name] has no tremors or shakes.  Patient is scheduled to see neurologist for nerve conduction studies and I reminded to discuss with him considering Lamictal as an alternative for Dilantin to help mood and seizures.  Patient promised that [name] will talk to his neurologist.  At this time [name] is not a candidate for [facility] as his insurance does not approve of this treatment. Discuss safety plan that anytime having active suicidal thoughts or homicidal thought.  [name] need to call 911 or go to the local emergency room.  Encouraged to continue therapy with counseling with Tharon Aquas for coping skills.  Follow-up in 8 weeks.  Filomeno Cromley T., MD 10/27/2015

## 2015-11-09 ENCOUNTER — Encounter: Payer: Self-pay | Admitting: Neurology

## 2015-11-15 ENCOUNTER — Ambulatory Visit (INDEPENDENT_AMBULATORY_CARE_PROVIDER_SITE_OTHER): Payer: Medicaid Other | Admitting: Family Medicine

## 2015-11-15 ENCOUNTER — Encounter: Payer: Self-pay | Admitting: Family Medicine

## 2015-11-15 VITALS — BP 128/82 | HR 90 | Temp 98.3°F | Ht 71.0 in | Wt 187.5 lb

## 2015-11-15 DIAGNOSIS — B079 Viral wart, unspecified: Secondary | ICD-10-CM

## 2015-11-15 DIAGNOSIS — E291 Testicular hypofunction: Secondary | ICD-10-CM

## 2015-11-15 DIAGNOSIS — B078 Other viral warts: Secondary | ICD-10-CM

## 2015-11-15 DIAGNOSIS — G40909 Epilepsy, unspecified, not intractable, without status epilepticus: Secondary | ICD-10-CM

## 2015-11-15 DIAGNOSIS — R202 Paresthesia of skin: Secondary | ICD-10-CM | POA: Diagnosis not present

## 2015-11-15 DIAGNOSIS — Z23 Encounter for immunization: Secondary | ICD-10-CM | POA: Diagnosis not present

## 2015-11-15 NOTE — Patient Instructions (Signed)
I'm glad things are going well for you Come see me after December 13 and we can go over the driving forms We can check testosterone level then if it's been 1 week since your last shot We can also do the wart on your finger  Be well, Dr. Ardelia Mems

## 2015-11-16 ENCOUNTER — Encounter: Payer: Medicaid Other | Admitting: Neurology

## 2015-11-16 ENCOUNTER — Telehealth: Payer: Self-pay | Admitting: Neurology

## 2015-11-16 ENCOUNTER — Other Ambulatory Visit: Payer: Self-pay | Admitting: Nurse Practitioner

## 2015-11-16 ENCOUNTER — Other Ambulatory Visit: Payer: Self-pay | Admitting: Family Medicine

## 2015-11-16 NOTE — Assessment & Plan Note (Signed)
Needs labs (PSA, testosterone level). Reminded patient of timing of labs (1 week after injection). Will attempt to do this when he returns to have driving papers completed, providing the timing is correct.

## 2015-11-16 NOTE — Assessment & Plan Note (Signed)
Improving spontaneously. Continue to monitor.

## 2015-11-16 NOTE — Assessment & Plan Note (Signed)
Will plan for cryotherapy at a future visit. May benefit from paring down of skin prior to cryotherapy given thickness over the wart.

## 2015-11-16 NOTE — Telephone Encounter (Signed)
Pt called requesting refill for Lacosamide (VIMPAT) 150 MG TABS.

## 2015-11-16 NOTE — Telephone Encounter (Signed)
Rx signed and faxed.

## 2015-11-16 NOTE — Telephone Encounter (Signed)
Rx has been signed and faxed  

## 2015-11-16 NOTE — Telephone Encounter (Signed)
Needs refill on dilantin-2 different sizes,  vimpat (sp?)  Elevil, Seroquel, simvastatin, metoprolol ,  [facility] [address]

## 2015-11-16 NOTE — Telephone Encounter (Signed)
Request was forwarded to provider for approval

## 2015-11-16 NOTE — Assessment & Plan Note (Signed)
Will schedule dedicated visit to complete DMV forms once he's been 6 months seizure free.

## 2015-11-16 NOTE — Progress Notes (Signed)
Date of Visit: 11/15/2015   HPI:  Patient presents for follow up.  Numbness in fingers - was diagnosed with carpal tunnel syndrome. Had been referred for nerve conduction studies, but then symptoms started improving. Saw neurologist recently who mentioned holding of on NCS since symptoms were improving.  Flu shot - would like flu vaccine today. Getting over a cold. Has been >24 hours since last fever.  Driving forms - will need some forms filled out from the [facility] stating he is eligible to drive again starting in December. Last seizure was on June 13 according to records. Agreeable with scheduling visit for dedicated time to fill out forms.   Wart on finger - has wart on R index finger. Does not want it frozen today, wants to make sure he has reliable transportation before doing cryotherapy. Will schedule this in the future.  Hypoandrogenism - on testosterone 400mg  IM every 2 weeks. Has not had labs checked in a while. No issues with urinary stream or rectal pain.  ROS: See HPI.  [place]: history of osteopenia, constipation, schizoaffective disorder, seizure disorder, hyperlipidemia, hypogonadism.  PHYSICAL EXAM: BP 128/82 mmHg  Pulse 90  Temp(Src) 98.3 F (36.8 C) (Oral)  Ht 5\' 11"  (1.803 m)  Wt 187 lb 8 oz (85.049 kg)  BMI 26.16 kg/m2 Gen: NAD, pleasant, cooperative HEENT: NCAT Neuro: alert, grossly nonfocal, speech normal, mentation intact, reading an intellectual book Ext: full grip strength bilaterally, sensation intact over bilateral hands. Wart present on R second finger.  ASSESSMENT/PLAN:  Health maintenance:  -flu shot given today   Paresthesia of both hands Improving spontaneously. Continue to monitor.   Seizure disorder Will schedule dedicated visit to complete DMV forms once he's been 6 months seizure free.  [place] Will plan for cryotherapy at a future visit. May benefit from paring down of skin prior to cryotherapy given thickness over the wart.  Androgen  deficiency Needs labs (PSA, testosterone level). Reminded patient of timing of labs (1 week after injection). Will attempt to do this when he returns to have driving papers completed, providing the timing is correct.   FOLLOW UP: F/u in several weeks for form completion, testosterone level, and cryotherapy of finger  [place] J. Ardelia Mems, [place]

## 2015-11-21 MED ORDER — SIMVASTATIN 40 MG PO TABS: ORAL_TABLET | ORAL | Status: DC

## 2015-11-21 MED ORDER — METOPROLOL SUCCINATE ER 50 MG PO TB24: ORAL_TABLET | ORAL | Status: DC

## 2015-11-21 NOTE — Telephone Encounter (Signed)
I sent in refills on his simvastatin and metoprolol.  The other medications will need to come from his neurologist and psychiatrist.   Neurology - dilantin, vimpat  Psychiatry - elavil, seroquel  These medications are managed by those respective specialists.  Please inform patient.  Leeanne Rio, MD

## 2015-11-24 NOTE — Telephone Encounter (Signed)
Left message on voicemail informing patient.  

## 2015-11-28 ENCOUNTER — Ambulatory Visit (INDEPENDENT_AMBULATORY_CARE_PROVIDER_SITE_OTHER): Payer: Medicaid Other | Admitting: Clinical

## 2015-11-28 DIAGNOSIS — F251 Schizoaffective disorder, depressive type: Secondary | ICD-10-CM

## 2015-11-29 ENCOUNTER — Encounter (HOSPITAL_COMMUNITY): Payer: Self-pay | Admitting: Clinical

## 2015-11-29 NOTE — Progress Notes (Signed)
   THERAPIST PROGRESS NOTE  Session Time: 3:30 -4:25  Participation Level: Active  Behavioral Response: DisheveledAlertDepressed  Type of Therapy: Individual Therapy  Treatment Goals addressed: improve psychiatric symptoms, Elevate mood, improve faulty thinking, interpersonal relationship skills  Interventions: CBT and Motivational Interviewing  Summary: [name] is a 59 y.o male who presents with Schizoaffective disorder, depressive type. .   Suicidal/Homicidal: No -without intent/plan  Therapist Response: [name] met clinician for an individual session. [name] discussed his psychiatric symptoms, his current life events and his goals for therapy. [name] shared that he was feeling very depressed lately. He was very agitated at the beginning of the session. He he stated that he was attending a mental health group and a woman there showed interest in him. [name] stated that he has never had a girlfriend before and doesn't really know how to negotiate that relationship. He shared that when she started to show interest in him he was terrified and she is trying to teach him to date. He has not told her that he does not have any experience with women. He shared that he has difficulty being honest with her about his inexperience. He shared that he asked her to marry him. He stated that he does not want to be married. [name] shared about the positives about the relationship which he said were companionship and the fact that she has some of the same problems he does such as being sloppy, not having a libido, and also not bathing. [name] shared some of the difficulties of the relationship such as not being able to communicate well and losing his focus often. Client and clinician discussed his anxiety which he said his not dissipated over the relationship. [name] shared that he had anxiety that he would seem crazy to her if he told her he couldn't think of anything to say or that he often lost his train of thought so he  looses track of their conversation. Client and clinician discussed how being honest with her might help his anxiety and also help her to understand why he is sometimes silent. [name] explained that it was more complicated than that because of the experience he had when he was 89 in which he feels he transcended duality and and was able to see both realized and unrealized manifestations. He shared that this experience made him see how ugly the world was.  [name] shared that since that experience he has felt like he is low were within the feces on the devils boots because he is able to do things like asked a girl to marry him and then change his mind. Client and clinician discussed whether or not everyone who changed his mind about marriage was a bad person. [name] said no. Clinician proposed that [name] could make a mistake or change his mind and still be a good person Client and clinician discussed how he might use his experience to focus on the positives in the beauty in life. [name] shared that while he could not feel the positive he could try to see them. [name] seemed calm by the end of the session.   Plan: Return again in 1-2 weeks.  Diagnosis: Axis I: Schizoaffective disorder, depressive type.    [name] A, LCSW 11/29/2015

## 2015-12-02 ENCOUNTER — Ambulatory Visit (INDEPENDENT_AMBULATORY_CARE_PROVIDER_SITE_OTHER): Payer: Medicaid Other | Admitting: Family Medicine

## 2015-12-02 ENCOUNTER — Encounter: Payer: Self-pay | Admitting: Family Medicine

## 2015-12-02 VITALS — BP 130/84 | HR 107 | Temp 98.5°F | Wt 194.5 lb

## 2015-12-02 DIAGNOSIS — Z0289 Encounter for other administrative examinations: Secondary | ICD-10-CM

## 2015-12-02 NOTE — Progress Notes (Signed)
Date of Visit: 12/02/2015   HPI:  Patient presents today to have form completed so he can get his drivers license back. Had to stop driving due to uncontrolled seizures.  MOCA performed today, score 24/30. Missed 1 point for language, one for verbal fluency, 1 for abstraction, 2 for delayed recall, 1 for not knowing day of month (off by 1 day).   Form reviewed in detail with patient, along with medications and medical history. Patient again denies any seizures since June 13. Feels mental health is well controlled at present as well. No SI/HI, or auditory or visual hallucinations. Feels well. Thinks he is ready to drive again. Compliant with medications. No recent memory concerns, thinks things are better now that his medications have been adjusted.  ROS: See HPI.  [place]: history of osteopenia, constipation, schizoaffective disorder, seizure disorder, hyperlipidemia, hypogonadism.  PHYSICAL EXAM: BP 130/84 mmHg  Pulse 107  Temp(Src) 98.5 F (36.9 C) (Oral)  Wt 194 lb 8 oz (88.225 kg) Gen: NAD, pleasant, cooperative HEENT: NCAT Lungs: normal work of breathing  Neuro: alert, grossly nonfocal, speech normal Psych: normal range of affect, well groomed, speech normal in rate and volume, normal eye contact. Thought process logical and linear.   ASSESSMENT/PLAN:  Driver form completion - form reviewed with patient in detail today and completed. Duration office visit visit >40 minutes, entirety spent face to face with patient. I am recommending he be able to drive. He is cognitively intact and does not have any significant cognitive or emotional impairment at present. Patient given original copies of DMV packet as well as duplicate for his records. Will scan another copy into the chart. Advised he have his opthalmologist/optometrist complete the vision sheet in the packet. Patient appreciative. He will follow up in 1 month with me for his regular medical problems.   FOLLOW UP: F/u in 1 month  for routine medical problems  [place] J. Ardelia Mems, [place] than 40 minutes were spent during this encounter, with at least 50% of the time devoted to counseling and coordination of care.

## 2015-12-02 NOTE — Patient Instructions (Signed)
Filled out your driving forms today You'll need to fill out the first page and have your eye doctor do the third page. Follow up with me in about a month for your testosterone levels.  Be well, Dr. Ardelia Mems

## 2015-12-05 ENCOUNTER — Telehealth: Payer: Self-pay | Admitting: Family Medicine

## 2015-12-05 NOTE — Telephone Encounter (Signed)
Pt was told Friday he needed to take a form to his eye dr to have it filled out. He doesn't know the name of his eye dr.  [name] we find out for him?

## 2015-12-08 NOTE — Telephone Encounter (Signed)
Attempted to call, but mailbox was full.  I have searched the referrals for this pt and we do not have info about an eye MD.  There nothing in media, this is not usually something that we would not have record of.  Please relay message to pt, unfortunately we are unable to get that info for him. Khyla Mccumbers, [place]

## 2015-12-20 ENCOUNTER — Other Ambulatory Visit (HOSPITAL_COMMUNITY): Payer: Self-pay | Admitting: Psychiatry

## 2015-12-22 ENCOUNTER — Ambulatory Visit (INDEPENDENT_AMBULATORY_CARE_PROVIDER_SITE_OTHER): Payer: Medicaid Other | Admitting: Psychiatry

## 2015-12-22 ENCOUNTER — Encounter (HOSPITAL_COMMUNITY): Payer: Self-pay | Admitting: Psychiatry

## 2015-12-22 VITALS — BP 128/81 | HR 100 | Ht 71.0 in | Wt 197.0 lb

## 2015-12-22 DIAGNOSIS — F333 Major depressive disorder, recurrent, severe with psychotic symptoms: Secondary | ICD-10-CM | POA: Diagnosis not present

## 2015-12-22 DIAGNOSIS — F251 Schizoaffective disorder, depressive type: Secondary | ICD-10-CM

## 2015-12-22 MED ORDER — QUETIAPINE FUMARATE 300 MG PO TABS: 600.0000 mg | ORAL_TABLET | Freq: Every day | ORAL | Status: DC

## 2015-12-22 MED ORDER — GABAPENTIN 100 MG PO CAPS: 100.0000 mg | ORAL_CAPSULE | Freq: Four times a day (QID) | ORAL | Status: DC

## 2015-12-22 MED ORDER — CLONAZEPAM 1 MG PO TABS: 1.0000 mg | ORAL_TABLET | Freq: Three times a day (TID) | ORAL | Status: DC

## 2015-12-22 MED ORDER — AMITRIPTYLINE HCL 150 MG PO TABS: 150.0000 mg | ORAL_TABLET | Freq: Every day | ORAL | Status: DC

## 2015-12-22 NOTE — Progress Notes (Signed)
[name] [id number] Progress Note  [name] [id number] 60 y.o.  12/22/2015 2:40 PM  Chief Complaint:  I want more Klonopin.  I have a lot of paranoia.  My anxiety is not under control.  I have no energy.      History of Present Illness:  [name] came for his follow-up appointment.  On his last visit we stop Ativan and started him on Klonopin 1 mg 3 times a day.  He likes Klonopin but he feel it is not strong enough.  He used to take 8 mg in the past.  Patient is also taking a moderate dose of Seroquel and amitriptyline .  He is also taking gabapentin 100 mg twice a day and he is tolerating the dose without any side effects.  He has no tremors or shakes.  Patient recently seen his primary care physician to continue his DMV forms as he like to get his license.  Due to history of seizures he was unable to drive but now he feel he is seizure free.  He is taking multiple medication for seizures.  Patient continued to endorse chronic symptoms of paranoia, depression, lack of motivation, fatigue, memory impairment and insists that he need a higher dose of Klonopin.  We had a long discussion about benzodiazepine dependence tolerance withdrawal and abuse.  I have told him that higher dose of benzo may cause further worsening of memory impairment.  Later patient agree to try a higher dose of gabapentin.  He endorse paranoia and believes that his brother is trying to kill him but when I asked more specific he replied that he has these thoughts for more than 2 years and his brother is living in [place].  He also believe that his brother is spying on him and invited him to have a Christmas with him but he decided not to go.  He is more focused on his benzodiazepine and lack of motivation.  He denies any suicidal thoughts, homicidal thought, hallucination but endorsed difficulty doing multitasking.  His numbness in his fingers is getting better.  He admitted sleep some time to much which could be due to  polypharmacy but he does not want to accept it.  Patient denies drinking or using any illegal substances.  He is seizure free for more than 9 months.  He started seeing [name] and he like to continue counseling.  Suicidal Ideation: No Plan Formed: No Patient has means to carry out plan: No  Homicidal Ideation: No Plan Formed: No Patient has means to carry out plan: No  Past Psychiatric History/Hospitalization(s): Patient has severe depression since his college.  He's been taking medication since 1984.  He has at least 15 psychiatric hospitalization due to severe depression, paranoia and suicidal thoughts.  His [facility] psychiatric [facility] at [facility] where he was admitted from July 27 to 07/21/2015.  He was thinking to asphyxiate on helium.  At that time he was taking Seroquel 900 mg at bedtime, amitriptyline 400 mg at bedtime and Klonopin 2 mg 4 times a day.  He had one history of suicidal attempt when he cut his testicle because he does not want to be depressed.  He admitted history of paranoia and negative thinking.  He was seeing at [name] at [facility] however it closed he moved to [name] to continue with [name].  He had tried numerous SSRIs however he always had opposite reaction with the SSRI.  He felt poor sleep, restlessness,  irritability and unable to tolerate SSRIs.  In the past she had tried nortriptyline, Haldol, Geodon, Risperdal, lithium, Prozac, Wellbutrin , Paxil, Ativan, Lexapro and Zoloft.  He denies any history of mania.  Patient denies any history of drugs or alcohol use. Anxiety: No Bipolar Disorder: No Depression: Yes Mania: No Psychosis: Paranoia and negative thinking Schizophrenia: No Personality Disorder: No Hospitalization for psychiatric illness: No History of Electroconvulsive Shock Therapy: No Prior Suicide Attempts: Yes  Medical History; Patient has hypertension, GERD, hyperlipidemia, mild memory impairment,  seizure disorder.  His primary care physician is [facility]. Patient also goes to [facility] neurology for seizures management.  Family History; Patient endorse uncle had severe depression and committed suicide by killing himself  Education and Work History; Patient was in the college when he had severe depression and he has to leave the college.  He is on disability.  Psychosocial History; Patient born and raised in [place].  He never married.  He has no children.  He has a good friend who he know for 15 years and very supportive.  His parents are deceased.  His father died 3 years ago.  He has one brother who live in [place].  Review of Systems  Constitutional: Negative.   Cardiovascular: Negative for chest pain and palpitations.  Musculoskeletal: Negative.   Skin: Negative for itching and rash.  Neurological: Negative for dizziness, tremors and headaches.  Psychiatric/Behavioral: Positive for memory loss. The patient is nervous/anxious.     Psychiatric: Agitation: No Hallucination: No Depressed Mood: Yes Insomnia: No Hypersomnia: No Altered Concentration: No Feels Worthless: No Grandiose Ideas: No Belief In Special Powers: No New/Increased Substance Abuse: No Compulsions: No  Neurologic: Headache: No Seizure: History of seizures Paresthesias: Yes   Outpatient Encounter Prescriptions as of 12/22/2015  Medication Sig  . amitriptyline (ELAVIL) 150 MG tablet Take 1 tablet (150 mg total) by mouth at bedtime.  Marland Kitchen aspirin EC 81 MG tablet Take 1 tablet (81 mg total) by mouth daily.  . clonazePAM (KLONOPIN) 1 MG tablet Take 1 tablet (1 mg total) by mouth 3 (three) times daily.  Marland Kitchen gabapentin (NEURONTIN) 100 MG capsule Take 1 capsule (100 mg total) by mouth 4 (four) times daily.  . Lacosamide (VIMPAT) 150 MG TABS Take 1 tablet (150 mg total) by mouth 2 (two) times daily.  Marland Kitchen loratadine (CLARITIN) 10 MG tablet Take 1 tablet (10 mg total) by mouth daily.  . meclizine  (ANTIVERT) 25 MG tablet Take 25 mg by mouth 3 (three) times daily.   . meclizine (ANTIVERT) 50 MG tablet Take 0.5 tablets (25 mg total) by mouth 3 (three) times daily as needed for dizziness.  . metoprolol succinate (TOPROL-XL) 50 MG 24 hr tablet TAKE 1 TABLET (50 MG TOTAL) BY MOUTH AT BEDTIME. TAKE WITH OR IMMEDIATELY FOLLOWING A MEAL.  Marland Kitchen phenytoin (DILANTIN INFATABS) 50 MG tablet One tablet at night  . phenytoin (DILANTIN) 100 MG ER capsule Take 2 capsules at night  . polyethylene glycol (MIRALAX / GLYCOLAX) packet Take 17 g by mouth 2 (two) times daily as needed. For constipation. Mix into 8 ounces of fluid & drink (Patient taking differently: Take 17 g by mouth 2 (two) times daily as needed (constipation). . Mix into 8 ounces of fluid & drink)  . QUEtiapine (SEROQUEL) 300 MG tablet Take 2 tablets (600 mg total) by mouth at bedtime.  . Selenium Sulf-Pyrithione-Urea 2.25 % SHAM APPLY TO AFFECTED AREA ON SCALP 3 TO 6 TIMES PER WEEK  . senna ([employer]  SENNA) 8.6 MG tablet Take 1 tablet (8.6 mg total) by mouth daily as needed for constipation.  . senna-docusate ([employer] SENNA PLUS) 8.6-50 MG per tablet Take 1 tablet by mouth at bedtime as needed for mild constipation.  . simvastatin (ZOCOR) 40 MG tablet TAKE 1 TABLET (40 MG TOTAL) BY MOUTH AT BEDTIME.  Marland Kitchen testosterone cypionate (DEPOTESTOSTERONE CYPIONATE) 200 MG/ML injection Inject 2 mLs (400 mg total) into the muscle every 14 (fourteen) days.  Marland Kitchen VIMPAT 100 MG TABS TAKE 1 AND 1/2 TABLETS BY MOUTH TWICE A DAY  . [DISCONTINUED] amitriptyline (ELAVIL) 150 MG tablet Take 1 tablet (150 mg total) by mouth at bedtime.  . [DISCONTINUED] clonazePAM (KLONOPIN) 1 MG tablet Take 1 tablet (1 mg total) by mouth 3 (three) times daily.  . [DISCONTINUED] gabapentin (NEURONTIN) 100 MG capsule Take 1 capsule (100 mg total) by mouth 2 (two) times daily.  . [DISCONTINUED] QUEtiapine (SEROQUEL) 300 MG tablet Take 2 tablets (600 mg total) by mouth at bedtime.   No  facility-administered encounter medications on file as of 12/22/2015.    Recent Results (from the past 2160 hour(s))  Comprehensive metabolic panel     Status: Abnormal   Collection Time: 10/25/15  9:18 AM  Result Value Ref Range   Glucose 130 (H) 65 - 99 mg/dL    Comment: Specimen received in contact with cells. No visible hemolysis present. However GLUC may be decreased and K increased. Clinical correlation indicated.    BUN 18 8 - 27 mg/dL   Creatinine, Ser 0.95 0.76 - 1.27 mg/dL   GFR calc non Af Amer 86 >59 mL/min/1.73   GFR calc Af Amer 99 >59 mL/min/1.73   BUN/Creatinine Ratio 19 10 - 22   Sodium 138 136 - 144 mmol/L   Potassium 4.1 3.5 - 5.2 mmol/L   Chloride 101 97 - 106 mmol/L   CO2 14 (L) 18 - 29 mmol/L    Comment: **Result Repeated**   Calcium 8.9 8.6 - 10.2 mg/dL   Total Protein 6.7 6.0 - 8.5 g/dL   Albumin 4.2 3.6 - 4.8 g/dL   Globulin, Total 2.5 1.5 - 4.5 g/dL   Albumin/Globulin Ratio 1.7 1.1 - 2.5   Bilirubin Total <0.2 0.0 - 1.2 mg/dL   Alkaline Phosphatase 91 39 - 117 IU/L   AST 22 0 - 40 IU/L   ALT 17 0 - 44 IU/L  CBC with Differential/Platelet     Status: Abnormal   Collection Time: 10/25/15  9:18 AM  Result Value Ref Range   WBC 7.2 3.4 - 10.8 x10E3/uL   RBC 4.92 4.14 - 5.80 x10E6/uL   Hemoglobin 14.7 12.6 - 17.7 g/dL   Hematocrit 43.2 37.5 - 51.0 %   MCV 88 79 - 97 fL   MCH 29.9 26.6 - 33.0 pg   MCHC 34.0 31.5 - 35.7 g/dL   RDW 15.5 (H) 12.3 - 15.4 %   Platelets 216 150 - 379 x10E3/uL   Neutrophils 72 %   Lymphs 18 %   Monocytes 7 %   Eos 3 %   Basos 0 %   Neutrophils Absolute 5.2 1.4 - 7.0 x10E3/uL   Lymphocytes Absolute 1.3 0.7 - 3.1 x10E3/uL   Monocytes Absolute 0.5 0.1 - 0.9 x10E3/uL   EOS (ABSOLUTE) 0.2 0.0 - 0.4 x10E3/uL   Basophils Absolute 0.0 0.0 - 0.2 x10E3/uL   Immature Granulocytes 0 %   Immature Grans (Abs) 0.0 0.0 - 0.1 x10E3/uL  Phenytoin level, total     Status:  None   Collection Time: 10/25/15  9:18 AM  Result Value Ref  Range   Phenytoin (Dilantin), Serum 12.9 10.0 - 20.0 ug/mL    Comment:                                 Neonatal:                                 Therapeutic 6.0 - 14.0                                 Detection Limit =  0.8                           <0.8 Indicates None Detected       Constitutional:  There were no vitals taken for this visit.   Musculoskeletal: Strength & Muscle Tone: within normal limits Gait & Station: normal Patient leans: N/A  Psychiatric Specialty Exam: General Appearance: Casual  Eye Contact::  Fair  Speech:  Slow  Volume:  Decreased  Mood:  Anxious  Affect:  Congruent  Thought Process:  Coherent  Orientation:  Full (Time, Place, and Person)  Thought Content:  Rumination  Suicidal Thoughts:  No  Homicidal Thoughts:  No  Memory:  Immediate;   Fair Recent;   Fair Remote;   Fair  Judgement:  Fair  Insight:  Good  Psychomotor Activity:  Decreased  Concentration:  Fair  Recall:  [employer] of Knowledge:  Fair  Language:  Good  Akathisia:  No  Handed:  Right  AIMS (if indicated):     Assets:  Communication Skills Desire for Improvement Financial Resources/Insurance Housing  ADL's:  Intact  Cognition:  Impaired,  Mild  Sleep:        Established Problem, Stable/Improving (1), Review of Psycho-Social Stressors (1), Review and summation of old records (2), Established Problem, Worsening (2), Review of Last Therapy Session (1), Review of Medication Regimen & Side Effects (2) and Review of New Medication or Change in Dosage (2)  Assessment: Axis I: Major depressive disorder, recurrent moderate with psychosis .  Rule out schizoaffective disorder depressed type.   Axis II: Deferred  Axis III:  Past Medical History  Diagnosis Date  . Seizures ([place])   . Hypertension   . GERD (gastroesophageal reflux disease)   . Dyslipidemia   . Schizoaffective disorder   . Depression   . Anxiety   . MRSA carrier   . Constipation   . Memory loss   .  Hearing loss      Plan:  I had a long discussion with the patient about his medication, polypharmacy, side effects and potential side effects.  Discussed benzodiazepine dependence tolerance and withdrawal.  We will not increase his Klonopin however we'll try gabapentin 100 mg 4 times a day to help his anxiety .  I strongly encouraged to continue counseling with [name] for coping and social skills.  Patient's symptoms are chronic in nature and I do believe he has mild cognitive impairment .  I will continue amitriptyline and Seroquel at present dose.  He has no tremors or shakes.  Encouraged to keep appointment with neurologist for management of seizures. Discuss safety plan that anytime having active suicidal thoughts or homicidal thought.  He need to call 911 or go to the local emergency room.  Follow-up in 3 months.    Tonyetta Berko T., MD 12/22/2015

## 2015-12-26 ENCOUNTER — Ambulatory Visit (HOSPITAL_COMMUNITY): Payer: Self-pay | Admitting: Clinical

## 2015-12-27 ENCOUNTER — Encounter: Payer: Medicaid Other | Admitting: Neurology

## 2015-12-29 ENCOUNTER — Telehealth (HOSPITAL_COMMUNITY): Payer: Self-pay

## 2015-12-29 NOTE — Telephone Encounter (Signed)
12/29/15 4:48pm Patient came and pick-up his rx scrip.[name] KitchenMariana Kaufman

## 2016-01-16 ENCOUNTER — Ambulatory Visit (HOSPITAL_COMMUNITY): Payer: Self-pay | Admitting: Clinical

## 2016-01-17 ENCOUNTER — Ambulatory Visit (INDEPENDENT_AMBULATORY_CARE_PROVIDER_SITE_OTHER): Payer: Medicaid Other | Admitting: Internal Medicine

## 2016-01-17 ENCOUNTER — Encounter: Payer: Self-pay | Admitting: Internal Medicine

## 2016-01-17 VITALS — BP 118/70 | HR 74 | Temp 98.6°F | Ht 71.0 in | Wt 193.0 lb

## 2016-01-17 DIAGNOSIS — R531 Weakness: Secondary | ICD-10-CM | POA: Diagnosis not present

## 2016-01-17 DIAGNOSIS — R5383 Other fatigue: Secondary | ICD-10-CM | POA: Diagnosis not present

## 2016-01-17 DIAGNOSIS — R350 Frequency of micturition: Secondary | ICD-10-CM | POA: Diagnosis not present

## 2016-01-17 DIAGNOSIS — R110 Nausea: Secondary | ICD-10-CM | POA: Diagnosis not present

## 2016-01-17 LAB — CBC
HCT: 43.2 % (ref 39.0–52.0)
Hemoglobin: 14.4 g/dL (ref 13.0–17.0)
MCH: 30.1 pg (ref 26.0–34.0)
MCHC: 33.3 g/dL (ref 30.0–36.0)
MCV: 90.2 fL (ref 78.0–100.0)
MPV: 9.5 fL (ref 8.6–12.4)
Platelets: 226 10*3/uL (ref 150–400)
RBC: 4.79 MIL/uL (ref 4.22–5.81)
RDW: 16.9 % — ABNORMAL HIGH (ref 11.5–15.5)
WBC: 6.6 10*3/uL (ref 4.0–10.5)

## 2016-01-17 LAB — COMPLETE METABOLIC PANEL WITH GFR
ALT: 16 U/L (ref 9–46)
AST: 16 U/L (ref 10–35)
Albumin: 4.1 g/dL (ref 3.6–5.1)
Alkaline Phosphatase: 69 U/L (ref 40–115)
BUN: 35 mg/dL — ABNORMAL HIGH (ref 7–25)
CO2: 23 mmol/L (ref 20–31)
Calcium: 9.1 mg/dL (ref 8.6–10.3)
Chloride: 102 mmol/L (ref 98–110)
Creat: 1.61 mg/dL — ABNORMAL HIGH (ref 0.70–1.25)
GFR, Est African American: 53 mL/min — ABNORMAL LOW (ref >=60)
GFR, Est Non African American: 45 mL/min — ABNORMAL LOW (ref >=60)
Glucose, Bld: 124 mg/dL — ABNORMAL HIGH (ref 65–99)
Potassium: 4.8 mmol/L (ref 3.5–5.3)
Sodium: 138 mmol/L (ref 135–146)
Total Bilirubin: 0.2 mg/dL (ref 0.2–1.2)
Total Protein: 7.2 g/dL (ref 6.1–8.1)

## 2016-01-17 LAB — POCT URINALYSIS DIPSTICK
Bilirubin, UA: NEGATIVE
Blood, UA: NEGATIVE
Glucose, UA: NEGATIVE
Ketones, UA: NEGATIVE
Leukocytes, UA: NEGATIVE
Nitrite, UA: NEGATIVE
Protein, UA: NEGATIVE
Spec Grav, UA: 1.01
Urobilinogen, UA: 0.2
pH, UA: 5.5

## 2016-01-17 LAB — POCT HEMOGLOBIN: Hemoglobin: 14.4 g/dL (ref 14.1–18.1)

## 2016-01-17 LAB — GLUCOSE, CAPILLARY: Glucose-Capillary: 132 mg/dL — ABNORMAL HIGH (ref 65–99)

## 2016-01-17 MED ORDER — ONDANSETRON HCL 8 MG PO TABS: 8.0000 mg | ORAL_TABLET | Freq: Three times a day (TID) | ORAL | Status: DC | PRN

## 2016-01-17 NOTE — Progress Notes (Signed)
Subjective:     Patient ID: [name], male   DOB: 02-08-1956, 60 y.o.   MRN: [id number]  HPI [name] is a 61-y.o. male who presents with chief complaint of nausea and generalized weakness x 5 days. He states it began gradually. At baseline, he spends a lot of his day in bed, but now he is having to take several breaks to lie down while trying to get dressed, which is a big change. He has not thrown up at all but reports he has only had coleslaw a few days ago and a couple of hotdogs today in terms of solid food. He has not taken any medications for his symptoms but has been drinking ensure to make up for decreased appetite for eat solid food. He has not had any recent medication changes.   He also notes occasional diaphoresis for the past 2 days and frequent urination without dysuria. However, he attributes urinary frequency to drinking more to avoid becoming dehydrated, but he is somewhat concerned due to a family history of T2DM (brother and 2 uncles).   He denies sick contacts, abdominal pain, vomiting, and diarrhea. He denies any changes in mood and reports feeling "flat" chronically. He denies any thoughts of hurting himself or others. He has not had a bowel movement in 3-4 days but has been passing lots of flatus.   Review of Systems  Constitutional: Positive for diaphoresis, activity change, appetite change and fatigue. Negative for fever.  Eyes: Negative for visual disturbance.  Respiratory: Positive for cough (on and off over the last few months (not at present)). Negative for shortness of breath.   Cardiovascular: Negative for chest pain.  Genitourinary: Negative for dysuria.   Social: Never smoker.     Objective: Blood pressure 118/70, pulse 74, temperature 98.6 F (37 C), temperature source Oral, height 5\' 11"  (1.803 m), weight 193 lb (87.544 kg), SpO2 98 %.   Physical Exam  Constitutional: He is oriented to person, place, and time. He appears well-developed and  well-nourished.  HENT:  Head: Normocephalic and atraumatic.  Mouth/Throat: Oropharynx is clear and moist.  Eyes: Conjunctivae and EOM are normal. Pupils are equal, round, and reactive to light.  Neck: Normal range of motion.  Cardiovascular: Normal rate and regular rhythm.  Exam reveals no gallop and no friction rub.   No murmur heard. Pulmonary/Chest: Effort normal and breath sounds normal. No respiratory distress. He has no wheezes. He has no rales.  Abdominal: Soft. Bowel sounds are normal. He exhibits no distension. There is no tenderness. There is no rebound and no guarding.  Musculoskeletal: Normal range of motion.  No gait abnormalities.   Lymphadenopathy:    He has no cervical adenopathy.  Neurological: He is alert and oriented to person, place, and time. No cranial nerve deficit.  Strength intact of upper and lower extremities.   Skin: He is diaphoretic.   Weight stable from baseline.     Assessment:     [name] is a 59-y.o. male with 5-day history of increased generalized weakness and nausea. Patient does not have a fever and has been able to remain well hydrated and tolerate some solid food. Strength is intact on exam, and patient is neurologically intact. He may be recovering from a viral illness but will perform labwork to rule-out hyperglycemia, anemia, and UTI as causes of symptoms.     Plan:     Asked patient to return in 1 week to follow-up on nausea and weakness and to  bring DMV forms, to be completed by PCP.      Nausea - Prescribed zofran to take as needed for nausea to help improve appetite.  - Encouraged patient to try to eat solids in the mornings when he has more of an appetite.   Generalized weakness - Will obtain POC hgb and CBG to rule out anemia and hyperglycemia. - Will obtain UA, CBC and CMET to look for infection and electrolyte abnormalities.    Olene Floss, MD [facility], PGY-1

## 2016-01-17 NOTE — Patient Instructions (Signed)
Mr. Skyberg,  Thank you for coming in today.   Your blood sugar and hemoglobin looked okay.  I will call you with any abnormal lab results (urine, blood and electrolytes).  Please make an appointment in a week to make sure you are feeling better and to complete your DMV papers. It would be helpful to bring those papers with you at that time.  Best, Dr. Ola Spurr

## 2016-01-17 NOTE — Assessment & Plan Note (Addendum)
-   Prescribed zofran to take as needed for nausea to help improve appetite.  - Encouraged patient to try to eat solids in the mornings when he has more of an appetite.

## 2016-01-17 NOTE — Assessment & Plan Note (Signed)
-   Will obtain POC hgb and CBG to rule out anemia and hyperglycemia. - Will obtain UA, CBC and CMET to look for infection and electrolyte abnormalities.

## 2016-01-24 ENCOUNTER — Other Ambulatory Visit: Payer: Self-pay | Admitting: Family Medicine

## 2016-01-24 ENCOUNTER — Other Ambulatory Visit: Payer: Self-pay | Admitting: Internal Medicine

## 2016-01-25 ENCOUNTER — Ambulatory Visit: Payer: Self-pay | Admitting: Family Medicine

## 2016-01-30 ENCOUNTER — Ambulatory Visit (INDEPENDENT_AMBULATORY_CARE_PROVIDER_SITE_OTHER): Payer: Medicaid Other | Admitting: Family Medicine

## 2016-01-30 ENCOUNTER — Encounter: Payer: Self-pay | Admitting: Family Medicine

## 2016-01-30 VITALS — BP 133/76 | HR 86 | Temp 98.8°F | Ht 71.0 in | Wt 197.0 lb

## 2016-01-30 DIAGNOSIS — L989 Disorder of the skin and subcutaneous tissue, unspecified: Secondary | ICD-10-CM | POA: Diagnosis not present

## 2016-01-30 DIAGNOSIS — R0600 Dyspnea, unspecified: Secondary | ICD-10-CM

## 2016-01-30 DIAGNOSIS — N179 Acute kidney failure, unspecified: Secondary | ICD-10-CM

## 2016-01-30 DIAGNOSIS — R110 Nausea: Secondary | ICD-10-CM

## 2016-01-30 DIAGNOSIS — R0601 Orthopnea: Secondary | ICD-10-CM | POA: Diagnosis not present

## 2016-01-30 LAB — BASIC METABOLIC PANEL WITH GFR
BUN: 19 mg/dL (ref 7–25)
CO2: 28 mmol/L (ref 20–31)
Calcium: 9.2 mg/dL (ref 8.6–10.3)
Chloride: 101 mmol/L (ref 98–110)
Creat: 1.08 mg/dL (ref 0.70–1.25)
GFR, Est African American: 85 mL/min (ref >=60)
GFR, Est Non African American: 74 mL/min (ref >=60)
Glucose, Bld: 131 mg/dL — ABNORMAL HIGH (ref 65–99)
Potassium: 4 mmol/L (ref 3.5–5.3)
Sodium: 138 mmol/L (ref 135–146)

## 2016-01-30 NOTE — Patient Instructions (Signed)
Medicaid transportation [phone]  Gave new documentation for your driving forms.  Checking kidney function today  Referring to dermatologist Setting up sleep study -someone will call you  Follow up with me in 6 weeks for routine medical problems.   Be well, Dr. Ardelia Mems

## 2016-01-30 NOTE — Progress Notes (Signed)
Date of Visit: 01/30/2016   HPI:  Patient presents for follow up of prior episode of nausea, also to have driving forms updated.  Nausea - doing much better now. No longer nauseated. Eating and drinking well  Sleep issue - has had sense of waking up and being unable to breathe about 2x in the last 6 months. Wakes up and feels like he can't breathe in our out. Happened last about 3 weeks ago. Sleeps well. Takes him 1.5-2h to fall asleep but then sleeps for about 5 hours straight.   Skin issue - has had bags/puffiness under his eyes for several months. Now has a couple of pink spots on skin under eyes. Wants to know what these are since they don't seen to be going away.  Driving paperwork - evidently when I filled out his DMV form, I did not include all the discharge diagnoses for his hospitalizations within the last 2 years. Needs this supplemental information before he can turn in his paperwork. Did get his eye doc to fill out that part.  ROS: See HPI.  [place]: history seizure disorder, low testosterone, hyperlipidemia, hypertension, ocd/depression/schizoaffective disorder  PHYSICAL EXAM: BP 133/76 mmHg  Pulse 86  Temp(Src) 98.8 F (37.1 C) (Oral)  Ht 5\' 11"  (1.803 m)  Wt 197 lb (89.359 kg)  BMI 27.49 kg/m2 Gen: NAD, pleasant, cooperative, well appearing HEENT: NCAT Neuro: alert, grossly nonfocal, speech normal Ext: atraumatic Abdomen: soft, nontender to palpation Skin: baggy area under bilateral eyes chronic in appearance. Several small pink papules present beneath each eye, overlying baggy area. No eye drainage or skin breakdown.  ASSESSMENT/PLAN:  Nocturnal dyspnea Doubt due to CHF, as echo less than 6 months ago had no evidence of CHF. Will set up for sleep study in case OSA is contributing. Patient agreeable to this plan.   Nausea Improved. Recheck bmet today due to AKI noted on last lab draw.   Skin lesion of face Etiology unclear. These spots are just beneath his eyes,  and are not in an area I would be comfortable biopsying or freezing. Refer to dermatology per patient preference.   Driving forms - completed additional documentation, in letter form, giving the necessary information. Patient appreciative. Also gave phone # for medicaid transportation, as he needed this info.  FOLLOW UP: Follow up in 6 weeks with me for chronic medical problems Referring to dermatology  [place] J. Ardelia Mems, [place]

## 2016-01-31 ENCOUNTER — Encounter: Payer: Self-pay | Admitting: Family Medicine

## 2016-02-03 DIAGNOSIS — R0600 Dyspnea, unspecified: Secondary | ICD-10-CM | POA: Insufficient documentation

## 2016-02-03 DIAGNOSIS — L989 Disorder of the skin and subcutaneous tissue, unspecified: Secondary | ICD-10-CM | POA: Insufficient documentation

## 2016-02-03 NOTE — Assessment & Plan Note (Addendum)
Doubt due to CHF, as echo less than 6 months ago had no evidence of CHF. Will set up for sleep study in case OSA is contributing. Patient agreeable to this plan.

## 2016-02-03 NOTE — Assessment & Plan Note (Signed)
Improved. Recheck bmet today due to AKI noted on last lab draw.

## 2016-02-03 NOTE — Assessment & Plan Note (Signed)
Etiology unclear. These spots are just beneath his eyes, and are not in an area I would be comfortable biopsying or freezing. Refer to dermatology per patient preference.

## 2016-02-09 ENCOUNTER — Ambulatory Visit (HOSPITAL_COMMUNITY): Payer: Self-pay | Admitting: Clinical

## 2016-02-17 ENCOUNTER — Other Ambulatory Visit: Payer: Self-pay | Admitting: Family Medicine

## 2016-02-17 DIAGNOSIS — E291 Testicular hypofunction: Secondary | ICD-10-CM

## 2016-02-17 MED ORDER — TESTOSTERONE CYPIONATE 200 MG/ML IM SOLN: 400.0000 mg | INTRAMUSCULAR | Status: DC

## 2016-02-17 NOTE — Telephone Encounter (Signed)
Pt called and needs a refill on his Testerone called in. jw

## 2016-02-17 NOTE — Telephone Encounter (Signed)
I phoned in Rx

## 2016-02-22 ENCOUNTER — Ambulatory Visit: Payer: Medicaid Other | Admitting: Adult Health

## 2016-02-22 ENCOUNTER — Telehealth: Payer: Self-pay | Admitting: Adult Health

## 2016-02-22 NOTE — Telephone Encounter (Signed)
Patient called 1:59pm to advise he may be 10-15 minutes late for 2:30pm appointment. Patient advised if 5-10 minutes late, he may still be seen, however he may have a wait, if 10-15 minutes late appointment will need to be rescheduled.

## 2016-02-23 ENCOUNTER — Encounter: Payer: Self-pay | Admitting: Adult Health

## 2016-02-23 ENCOUNTER — Other Ambulatory Visit: Payer: Self-pay | Admitting: Family Medicine

## 2016-02-28 ENCOUNTER — Other Ambulatory Visit (HOSPITAL_COMMUNITY): Payer: Self-pay | Admitting: Psychiatry

## 2016-02-28 DIAGNOSIS — F251 Schizoaffective disorder, depressive type: Secondary | ICD-10-CM

## 2016-02-28 NOTE — Telephone Encounter (Signed)
Message received from patient requesting a refill of prescribed Clonazepam, last provided for 30 day supply on 12/22/15 and patient does not return until 03/22/16.

## 2016-02-28 NOTE — Telephone Encounter (Signed)
Met with Dr. Arfeen who approved a new 30 day order for patient's prescribed Clonazepam 1 mg, one three times a day, #90 with no refills and called in order with Julia, pharmacist at [facility] on [address].  

## 2016-03-08 ENCOUNTER — Encounter (HOSPITAL_COMMUNITY): Payer: Self-pay | Admitting: Clinical

## 2016-03-08 ENCOUNTER — Ambulatory Visit (INDEPENDENT_AMBULATORY_CARE_PROVIDER_SITE_OTHER): Payer: Medicaid Other | Admitting: Clinical

## 2016-03-08 DIAGNOSIS — F251 Schizoaffective disorder, depressive type: Secondary | ICD-10-CM | POA: Diagnosis not present

## 2016-03-08 NOTE — Progress Notes (Signed)
   THERAPIST PROGRESS NOTE  Session Time: *1:30 -2:25  Participation Level: Active  Behavioral Response: CasualAlertDepressed   Type of Therapy: Individual Therapy  Treatment Goals addressed: improve psychiatric symptoms, Elevate mood, improve faulty thinking, interpersonal relationship skills  Interventions: CBT and Motivational Interviewing  Suicidal/Homicidal: No -without intent/plan  Summary: [name] is a 60 y.o male who presents with Schizoaffective disorder, depressive type. [name] shared that he has been experiencing  A lot of depression. [name] shared that he wanted to understand why the "doctors" had given him the diagnosis of schizoaffective disorder. Clinician told client she could not speak for the doctors but would tell him her thoughts. Clinician explained that she diagnosed him that due to delusions and and depression. [name] shared that he did not have delusions. He then went on to share that his brother had hired  Private eyes to go into his house anytime he leaves the house, he shared that they have been doing this for years and he has found things missing ( not expensive items). He shared that his brother wants him dead and invited him to come visit but he did not go because he believes his brother wants to kill him. [name] also shared that he doesn't take his trash out until it builds up and then takes it late at night for fear his neighbors will see him and judge him. Clinician asked if there was ever a time he was not focused on these thoughts. [name] shared that when he is with his male friend he doesn't think about it. He shared that they eat together and sometimes watch movies. Client and clinician discussed how when he is visiting with her his focus changes. He shared he does not experience strong emotions but does appreciate the distraction. Client and clinician discussed how he might use distraction to benefit his mood at other times     Plan: Return again in 1-2  weeks.  Diagnosis: Axis I: Schizoaffective disorder, depressive type.  Karry Barrilleaux A, LCSW 03/08/2016

## 2016-03-13 ENCOUNTER — Ambulatory Visit: Payer: Self-pay | Admitting: Family Medicine

## 2016-03-22 ENCOUNTER — Ambulatory Visit (INDEPENDENT_AMBULATORY_CARE_PROVIDER_SITE_OTHER): Payer: Medicaid Other | Admitting: Psychiatry

## 2016-03-22 ENCOUNTER — Encounter (HOSPITAL_COMMUNITY): Payer: Self-pay | Admitting: Psychiatry

## 2016-03-22 VITALS — BP 130/80 | HR 94 | Ht 71.0 in | Wt 202.6 lb

## 2016-03-22 DIAGNOSIS — F333 Major depressive disorder, recurrent, severe with psychotic symptoms: Secondary | ICD-10-CM

## 2016-03-22 DIAGNOSIS — F251 Schizoaffective disorder, depressive type: Secondary | ICD-10-CM

## 2016-03-22 MED ORDER — GABAPENTIN 100 MG PO CAPS: 100.0000 mg | ORAL_CAPSULE | Freq: Four times a day (QID) | ORAL | Status: DC

## 2016-03-22 MED ORDER — AMITRIPTYLINE HCL 150 MG PO TABS: 150.0000 mg | ORAL_TABLET | Freq: Every day | ORAL | Status: DC

## 2016-03-22 MED ORDER — QUETIAPINE FUMARATE 300 MG PO TABS: 600.0000 mg | ORAL_TABLET | Freq: Every day | ORAL | Status: DC

## 2016-03-22 MED ORDER — CLONAZEPAM 1 MG PO TABS: 1.0000 mg | ORAL_TABLET | Freq: Three times a day (TID) | ORAL | Status: DC

## 2016-03-22 NOTE — Progress Notes (Signed)
[name] [phone] Progress Note  [name] [id number] 60 y.o.  03/22/2016 2:14 PM  Chief Complaint:  Medication management and follow-up.        History of Present Illness:  [name] came for his follow-up appointment.  He continues to have chronic symptoms of paranoia, depression and anxiety.  He's taking his medication as prescribed.  He is pleased that he is able to get his license which he has not driven yet.  He claims to be seizure-free and is very pleased about it.  He continued to endorse chronic symptoms of paranoia, lack of motivation, fatigue and decreased energy but also believe medicine helping him.  He sleeping 4-5 hours.  He is taking a moderate dose of benzodiazepine along with Seroquel and amitriptyline and Neurontin.  On his last visit we recommended increase gabapentin and that helped some with his anxiety.  Patient has no tremors or shakes.  He has mild cognitive impairment which has been stable.  He continued to believe that his brother is trying to kill him for past 2 years but there has been no recent incident in past 3 months.  He denies any suicidal thoughts, homicidal thought, hallucination but endorsed difficulty doing multitasking.  Recently he had blood work which shows creatinine 1.6.  He admitted being dehydrated and he was told to drink enough water.  His B and was 35.  Patient denies drinking alcohol or  using any illegal substances.  His appetite is okay.  His vitals are stable.  He recently started seeing Tharon Aquas and he like to continue counseling.  Suicidal Ideation: No Plan Formed: No Patient has means to carry out plan: No  Homicidal Ideation: No Plan Formed: No Patient has means to carry out plan: No  Past Psychiatric History/Hospitalization(s): Patient has severe depression since his college.  He's been taking medication since 1984.  He has at least 15 psychiatric hospitalization due to severe depression, paranoia and suicidal thoughts.  His [facility]  [facility]iatric [facility] where he was [facility] from July 27 to 07/21/2015.  He was thinking to asphyxiate on helium.  At that time he was taking Seroquel 900 mg at bedtime, amitriptyline 400 mg at bedtime and Klonopin 2 mg 4 times a day.  He had one history of suicidal attempt when he cut his testicle because he does not want to be depressed.  He admitted history of paranoia and negative thinking.  He was seeing at [name] at [facility] however it closed he moved to [name] to continue with [name].  He had tried numerous SSRIs however he always had opposite reaction with the SSRI.  He felt poor sleep, restlessness, irritability and unable to tolerate SSRIs.  In the past she had tried nortriptyline, Haldol, Geodon, Risperdal, lithium, Prozac, Wellbutrin , Paxil, Ativan, Lexapro and Zoloft.  He denies any history of mania.  Patient denies any history of drugs or alcohol use. Anxiety: No Bipolar Disorder: No Depression: Yes Mania: No Psychosis: Paranoia and negative thinking Schizophrenia: No Personality Disorder: No Hospitalization for psychiatric illness: No History of Electroconvulsive Shock Therapy: No Prior Suicide Attempts: Yes  Medical History; Patient has hypertension, GERD, hyperlipidemia, mild memory impairment, seizure disorder.  His primary care physician is [facility]. Patient also goes to [facility] for seizures management.  Family History; Patient endorse uncle had severe depression and committed suicide by killing himself  Education and Work History; Patient was in the college when he had  severe depression and he has to leave the college.  He is on disability.  Psychosocial History; Patient born and raised in [place].  He never married.  He has no children.  He has a good friend who he know for 15 years and very supportive.  His parents are deceased.  His father died 3 years ago.  He has one  brother who live in [place].  Review of Systems  Constitutional: Negative.   Cardiovascular: Negative for chest pain and palpitations.  Musculoskeletal: Negative.   Skin: Negative for itching and rash.  Neurological: Negative for dizziness, tremors and headaches.  Psychiatric/Behavioral: Positive for memory loss. The patient is nervous/anxious.     Psychiatric: Agitation: No Hallucination: No Depressed Mood: Yes Insomnia: No Hypersomnia: No Altered Concentration: No Feels Worthless: No Grandiose Ideas: No Belief In Special Powers: No New/Increased Substance Abuse: No Compulsions: No  Neurologic: Headache: No Seizure: History of seizures Paresthesias: Yes   Outpatient Encounter Prescriptions as of 03/22/2016  Medication Sig  . amitriptyline (ELAVIL) 150 MG tablet Take 1 tablet (150 mg total) by mouth at bedtime.  Marland Kitchen aspirin EC 81 MG tablet Take 1 tablet (81 mg total) by mouth daily.  . clonazePAM (KLONOPIN) 1 MG tablet Take 1 tablet (1 mg total) by mouth 3 (three) times daily.  . CVS SENNA PLUS 8.6-50 MG tablet TAKE 1 TABLET BY MOUTH AT BEDTIME AS NEEDED FOR MILD CONSTIPATION  . gabapentin (NEURONTIN) 100 MG capsule Take 1 capsule (100 mg total) by mouth 4 (four) times daily.  . Lacosamide (VIMPAT) 150 MG TABS Take 1 tablet (150 mg total) by mouth 2 (two) times daily.  Marland Kitchen loratadine (CLARITIN) 10 MG tablet Take 1 tablet (10 mg total) by mouth daily.  . metoprolol succinate (TOPROL-XL) 50 MG 24 hr tablet TAKE 1 TABLET (50 MG TOTAL) BY MOUTH AT BEDTIME. TAKE WITH OR IMMEDIATELY FOLLOWING A MEAL.  Marland Kitchen phenytoin (DILANTIN INFATABS) 50 MG tablet One tablet at night  . phenytoin (DILANTIN) 100 MG ER capsule Take 2 capsules at night  . polyethylene glycol (MIRALAX / GLYCOLAX) packet TAKE 17 GRAMS 2 TIMES A DAY AS NEEDED FOR CONSTIPATION. MIX INTO 8 OF FLUID AND DRINK  . QUEtiapine (SEROQUEL) 300 MG tablet Take 2 tablets (600 mg total) by mouth at bedtime.  . Selenium  Sulf-Pyrithione-Urea 2.25 % SHAM APPLY TO AFFECTED AREA ON SCALP 3 TO 6 TIMES PER WEEK  . senna (CVS SENNA) 8.6 MG tablet Take 1 tablet (8.6 mg total) by mouth daily as needed for constipation.  . simvastatin (ZOCOR) 40 MG tablet TAKE 1 TABLET (40 MG TOTAL) BY MOUTH AT BEDTIME.  Marland Kitchen testosterone cypionate (DEPOTESTOSTERONE CYPIONATE) 200 MG/ML injection Inject 2 mLs (400 mg total) into the muscle every 14 (fourteen) days.  Marland Kitchen VIMPAT 100 MG TABS TAKE 1 AND 1/2 TABLETS BY MOUTH TWICE A DAY  . [DISCONTINUED] amitriptyline (ELAVIL) 150 MG tablet Take 1 tablet (150 mg total) by mouth at bedtime.  . [DISCONTINUED] clonazePAM (KLONOPIN) 1 MG tablet TAKE 1 TABLET BY MOUTH 3 TIMES A DAY  . [DISCONTINUED] gabapentin (NEURONTIN) 100 MG capsule Take 1 capsule (100 mg total) by mouth 4 (four) times daily.  . [DISCONTINUED] meclizine (ANTIVERT) 25 MG tablet Take 25 mg by mouth 3 (three) times daily.   . [DISCONTINUED] meclizine (ANTIVERT) 50 MG tablet Take 0.5 tablets (25 mg total) by mouth 3 (three) times daily as needed for dizziness.  . [DISCONTINUED] ondansetron (ZOFRAN) 8 MG tablet Take 1 tablet (8 mg total)  by mouth every 8 (eight) hours as needed for nausea or vomiting.  . [DISCONTINUED] QUEtiapine (SEROQUEL) 300 MG tablet Take 2 tablets (600 mg total) by mouth at bedtime.   No facility-administered encounter medications on file as of 03/22/2016.    Recent Results (from the past 2160 hour(s))  POCT urinalysis dipstick     Status: None   Collection Time: 01/17/16  2:46 PM  Result Value Ref Range   Color, UA YELLOW    Clarity, UA CLEAR    Glucose, UA NEG    Bilirubin, UA NEG    Ketones, UA NEG    Spec Grav, UA 1.010    Blood, UA NEG    pH, UA 5.5    Protein, UA NEG    Urobilinogen, UA 0.2    Nitrite, UA NEG    Leukocytes, UA Negative Negative  POCT hemoglobin     Status: None   Collection Time: 01/17/16  3:00 PM  Result Value Ref Range   Hemoglobin 14.4 14.1 - 18.1 g/dL  CBC     Status:  Abnormal   Collection Time: 01/17/16  3:01 PM  Result Value Ref Range   WBC 6.6 4.0 - 10.5 K/uL   RBC 4.79 4.22 - 5.81 MIL/uL   Hemoglobin 14.4 13.0 - 17.0 g/dL   HCT 43.2 39.0 - 52.0 %   MCV 90.2 78.0 - 100.0 fL   MCH 30.1 26.0 - 34.0 pg   MCHC 33.3 30.0 - 36.0 g/dL   RDW 16.9 (H) 11.5 - 15.5 %   Platelets 226 150 - 400 K/uL   MPV 9.5 8.6 - 12.4 fL  COMPLETE METABOLIC PANEL WITH GFR     Status: Abnormal   Collection Time: 01/17/16  3:01 PM  Result Value Ref Range   Sodium 138 135 - 146 mmol/L   Potassium 4.8 3.5 - 5.3 mmol/L   Chloride 102 98 - 110 mmol/L   CO2 23 20 - 31 mmol/L   Glucose, Bld 124 (H) 65 - 99 mg/dL   BUN 35 (H) 7 - 25 mg/dL   Creat 1.61 (H) 0.70 - 1.25 mg/dL   Total Bilirubin 0.2 0.2 - 1.2 mg/dL   Alkaline Phosphatase 69 40 - 115 U/L   AST 16 10 - 35 U/L   ALT 16 9 - 46 U/L   Total Protein 7.2 6.1 - 8.1 g/dL   Albumin 4.1 3.6 - 5.1 g/dL   Calcium 9.1 8.6 - 10.3 mg/dL   GFR, Est African American 53 (L) >=60 mL/min   GFR, Est Non African American 45 (L) >=60 mL/min    Comment:   The estimated GFR is a calculation valid for adults (>=20 years old) that uses the CKD-EPI algorithm to adjust for age and sex. It is   not to be used for children, pregnant women, hospitalized patients,    patients on dialysis, or with rapidly changing kidney function. According to the NKDEP, eGFR >89 is normal, 60-89 shows mild impairment, 30-59 shows moderate impairment, 15-29 shows severe impairment and <15 is ESRD.     Glucose, capillary     Status: Abnormal   Collection Time: 01/17/16  3:04 PM  Result Value Ref Range   Glucose-Capillary 132 (H) 65 - 99 mg/dL  BASIC METABOLIC PANEL WITH GFR     Status: Abnormal   Collection Time: 01/30/16  4:55 PM  Result Value Ref Range   Sodium 138 135 - 146 mmol/L   Potassium 4.0 3.5 - 5.3 mmol/L  Chloride 101 98 - 110 mmol/L   CO2 28 20 - 31 mmol/L   Glucose, Bld 131 (H) 65 - 99 mg/dL   BUN 19 7 - 25 mg/dL   Creat 1.08 0.70 -  1.25 mg/dL   Calcium 9.2 8.6 - 10.3 mg/dL   GFR, Est African American 85 >=60 mL/min   GFR, Est Non African American 74 >=60 mL/min    Comment:   The estimated GFR is a calculation valid for adults (>=62 years old) that uses the CKD-EPI algorithm to adjust for age and sex. It is   not to be used for children, pregnant women, hospitalized patients,    patients on dialysis, or with rapidly changing kidney function. According to the NKDEP, eGFR >89 is normal, 60-89 shows mild impairment, 30-59 shows moderate impairment, 15-29 shows severe impairment and <15 is ESRD.         Constitutional:  BP 130/80 mmHg  Pulse 94  Ht 5' 11"  (1.803 m)  Wt 202 lb 9.6 oz (91.899 kg)  BMI 28.27 kg/m2   Musculoskeletal: Strength & Muscle Tone: within normal limits Gait & Station: normal Patient leans: N/A  Psychiatric Specialty Exam: General Appearance: Casual  Eye Contact::  Fair  Speech:  Slow  Volume:  Decreased  Mood:  Anxious  Affect:  Congruent  Thought Process:  Coherent  Orientation:  Full (Time, Place, and Person)  Thought Content:  Rumination  Suicidal Thoughts:  No  Homicidal Thoughts:  No  Memory:  Immediate;   Fair Recent;   Fair Remote;   Fair  Judgement:  Fair  Insight:  Good  Psychomotor Activity:  Decreased  Concentration:  Fair  Recall:  [employer] of Knowledge:  Fair  Language:  Good  Akathisia:  No  Handed:  Right  AIMS (if indicated):     Assets:  Communication Skills Desire for Improvement Financial Resources/Insurance Housing  ADL's:  Intact  Cognition:  Impaired,  Mild  Sleep:        Established Problem, Stable/Improving (1), Review of Psycho-Social Stressors (1), Review or order clinical lab tests (1), Review and summation of old records (2), Review of Last Therapy Session (1) and Review of Medication Regimen & Side Effects (2)  Assessment: Axis I: Major depressive disorder, recurrent moderate with psychosis .  Rule out schizoaffective disorder  depressed type.   Axis II: Deferred  Axis III:  Past Medical History  Diagnosis Date  . Seizures ([place])   . Hypertension   . GERD (gastroesophageal reflux disease)   . Dyslipidemia   . Schizoaffective disorder   . Depression   . Anxiety   . MRSA carrier   . Constipation   . Memory loss   . Hearing loss      Plan:  I review his blood work results, psychosocial stressors, current medication.  I discussed in detail polypharmacy and medication side effects.  Encouraged to keep his appointment with therapist.  Discuss high BUN/creatinine and encouraged to follow-up with his primary care physician if symptoms started to get worse.  I will continue gabapentin 100 mg 4 times a day, amitriptyline 150 mg at bedtime, Klonopin 1 mg 3 times a day and Seroquel 600 mg at bedtime.  At this time he does not have any tremors, shakes, EPS.  Encouraged to keep appointment with neurologist for the management of seizure medicine. Discuss safety plan that anytime having active suicidal thoughts or homicidal thought.  He need to call 911 or go to the local  emergency room.  Follow-up in 3 months.    Patrik Turnbaugh T., MD 03/22/2016

## 2016-03-23 ENCOUNTER — Other Ambulatory Visit: Payer: Self-pay | Admitting: Nurse Practitioner

## 2016-03-23 ENCOUNTER — Other Ambulatory Visit: Payer: Self-pay | Admitting: Family Medicine

## 2016-03-23 NOTE — Telephone Encounter (Signed)
Pt is calling for a refill on both of his medications for seizures. Please call in. jw

## 2016-03-27 NOTE — Telephone Encounter (Signed)
Made appt with pt with MM/NP at [facility] 03-28-16 at 1430. (refills of seizure meds).

## 2016-03-28 ENCOUNTER — Encounter: Payer: Self-pay | Admitting: Adult Health

## 2016-03-28 ENCOUNTER — Ambulatory Visit (INDEPENDENT_AMBULATORY_CARE_PROVIDER_SITE_OTHER): Payer: Medicaid Other | Admitting: Adult Health

## 2016-03-28 VITALS — BP 126/84 | HR 71 | Ht 71.0 in | Wt 201.6 lb

## 2016-03-28 DIAGNOSIS — Z5181 Encounter for therapeutic drug level monitoring: Secondary | ICD-10-CM

## 2016-03-28 DIAGNOSIS — R569 Unspecified convulsions: Secondary | ICD-10-CM

## 2016-03-28 MED ORDER — LACOSAMIDE 150 MG PO TABS: 150.0000 mg | ORAL_TABLET | Freq: Two times a day (BID) | ORAL | Status: DC

## 2016-03-28 NOTE — Patient Instructions (Signed)
Blood work today Continue dilantin 250 mg at bedtime Vimpat 150 mg twice a day If your symptoms worsen or you develop new symptoms please let us know.

## 2016-03-28 NOTE — Progress Notes (Signed)
PATIENT: [name] DOB: 1956-07-01  REASON FOR VISIT: follow up-  intractable seizures HISTORY FROM: patient  HISTORY OF PRESENT ILLNESS: [name] is a 61 year old male with a history of schizophrenia and intractable seizures. He returns today for follow-up. The patient states that he has been tolerating Dilantin and Vimpat well. He denies any seizure events. He states that he lives home alone. He is able to complete all ADLs independently. He is currently not operating a motor vehicle. He denies any changes with his gait or balance. Denies any falls. The patient states that in the past Dilantin has caused vertigo and diplopia however he denies the symptoms now. At the last visit the patient was complaining of numbness in the hands and reported that wrist splints were beneficial. He states that he no longer has the numbness and is not wearing the splints. He denies any new neurological symptoms. He returns today for an evaluation.  HISTORY 10/25/15 [name]): [name] is a 60 year old left-handed white male with a history of schizophrenia and intractable seizures. The patient last had a seizure on 05/30/2015. The patient had some adjustments in his Dilantin dosing, but became toxic on Dilantin by August 2016. The patient will has had a reduction in the dose to a 250 mg daily dosing. The patient has done better with this, he has not had any further seizures. The dizziness problems have improved. His gait stability has gotten better. The patient has developed some numbness in the hands bilaterally, he has started wearing wrist splints with good improvement in the symptoms already. He returns to this office for an evaluation.  REVIEW OF SYSTEMS: Out of a complete 14 system review of symptoms, the patient complains only of the following symptoms, and all other reviewed systems are negative.  Excessive sweating, heat intolerance, insomnia, memory loss, seizure, depression,  nervous/anxious  ALLERGIES: Allergies  Allergen Reactions  . Codeine Nausea And Vomiting  . Sulfa Antibiotics Nausea And Vomiting    HOME MEDICATIONS: Outpatient Prescriptions Prior to Visit  Medication Sig Dispense Refill  . amitriptyline (ELAVIL) 150 MG tablet Take 1 tablet (150 mg total) by mouth at bedtime. 30 tablet 2  . aspirin EC 81 MG tablet Take 1 tablet (81 mg total) by mouth daily.    . clonazePAM (KLONOPIN) 1 MG tablet Take 1 tablet (1 mg total) by mouth 3 (three) times daily. 90 tablet 0  . [facility] SENNA PLUS 8.6-50 MG tablet TAKE 1 TABLET BY MOUTH AT BEDTIME AS NEEDED FOR MILD CONSTIPATION 30 tablet 3  . gabapentin (NEURONTIN) 100 MG capsule Take 1 capsule (100 mg total) by mouth 4 (four) times daily. 120 capsule 2  . metoprolol succinate (TOPROL-XL) 50 MG 24 hr tablet TAKE 1 TABLET (50 MG TOTAL) BY MOUTH AT BEDTIME. TAKE WITH OR IMMEDIATELY FOLLOWING A MEAL. 30 tablet 5  . phenytoin (DILANTIN INFATABS) 50 MG tablet One tablet at night 30 tablet 5  . phenytoin (DILANTIN) 100 MG ER capsule Take 2 capsules at night 60 capsule 5  . polyethylene glycol (MIRALAX / GLYCOLAX) packet TAKE 17 GRAMS 2 TIMES A DAY AS NEEDED FOR CONSTIPATION. MIX INTO 8 OF FLUID AND DRINK 100 packet 4  . QUEtiapine (SEROQUEL) 300 MG tablet Take 2 tablets (600 mg total) by mouth at bedtime. 60 tablet 2  . Selenium Sulf-Pyrithione-Urea 2.25 % SHAM APPLY TO AFFECTED AREA ON SCALP 3 TO 6 TIMES PER WEEK 180 mL 0  . senna ([facility] SENNA) 8.6 MG tablet Take 1  tablet (8.6 mg total) by mouth daily as needed for constipation. 90 tablet 0  . simvastatin (ZOCOR) 40 MG tablet TAKE 1 TABLET (40 MG TOTAL) BY MOUTH AT BEDTIME. 30 tablet 5  . testosterone cypionate (DEPOTESTOSTERONE CYPIONATE) 200 MG/ML injection Inject 2 mLs (400 mg total) into the muscle every 14 (fourteen) days. 10 mL 2  . Lacosamide (VIMPAT) 150 MG TABS Take 1 tablet (150 mg total) by mouth 2 (two) times daily. 28 tablet 0  . loratadine (CLARITIN) 10 MG  tablet Take 1 tablet (10 mg total) by mouth daily. 30 tablet 0  . VIMPAT 100 MG TABS TAKE 1 AND 1/2 TABLETS BY MOUTH TWICE A DAY 90 tablet 3   No facility-administered medications prior to visit.    PAST MEDICAL HISTORY: Past Medical History  Diagnosis Date  . Seizures ([place])   . Hypertension   . GERD (gastroesophageal reflux disease)   . Dyslipidemia   . Schizoaffective disorder   . Depression   . Anxiety   . MRSA carrier   . Constipation   . Memory loss   . Hearing loss     PAST SURGICAL HISTORY: Past Surgical History  Procedure Laterality Date  . Self orchectomy    . Colonoscopy  2010  . Orif shoulder fracture    . Shoulder closed reduction Left 09/16/2013    Procedure: CLOSED REDUCTION SHOULDER;  Surgeon: Mauri Pole, MD;  Location: [facility];  Service: Orthopedics;  Laterality: Left;  . Shoulder hemi-arthroplasty Left 09/18/2013    Procedure: LEFT SHOULDER HEMI-ARTHROPLASTY;  Surgeon: Augustin Schooling, MD;  Location: [facility];  Service: Orthopedics;  Laterality: Left;  . Carpal tunnel release      FAMILY HISTORY: Family History  Problem Relation Age of Onset  . Stroke Father     Living at 75  . Stroke Mother     Stroke in late 6's. Died in her 51's  . Alcohol abuse Brother   . Alcohol abuse Brother     SOCIAL HISTORY: Social History   Social History  . Marital Status: Single    Spouse Name: N/A  . Number of Children: 0  . Years of Education: 18   Occupational History  . disabled    Social History Main Topics  . Smoking status: Never Smoker   . Smokeless tobacco: Never Used  . Alcohol Use: No  . Drug Use: No  . Sexual Activity: No   Other Topics Concern  . Not on file   Social History Narrative   Patient does not drink caffeine.   Patient is left handed.      PHYSICAL EXAM  Filed Vitals:   03/28/16 1422  BP: 126/84  Pulse: 71  Height: 5\' 11"  (1.803 m)  Weight: 201 lb 9.6 oz (91.445 kg)   Body mass index is 28.13  kg/(m^2).  Generalized: Well developed, in no acute distress   Neurological examination  Mentation: Alert oriented to time, place, history taking. Follows all commands speech and language fluent Cranial nerve II-XII: Pupils were equal round reactive to light. Extraocular movements were full, visual field were full on confrontational test. Facial sensation and strength were normal. Uvula tongue midline. Head turning and shoulder shrug  were normal and symmetric. Motor: The motor testing reveals 5 over 5 strength of all 4 extremities. Good symmetric motor tone is noted throughout.  Sensory: Sensory testing is intact to soft touch on all 4 extremities. No evidence of extinction is noted.  Coordination: Cerebellar testing reveals  good finger-nose-finger and heel-to-shin bilaterally.  Gait and station: Gait is normal. Tandem gait is Slightly unsteady. Romberg is negative but slightly unsteady. No drift is seen.  Reflexes: Deep tendon reflexes are symmetric and normal bilaterally.   DIAGNOSTIC DATA (LABS, IMAGING, TESTING) - I reviewed patient records, labs, notes, testing and imaging myself where available.  Lab Results  Component Value Date   WBC 6.6 01/17/2016   HGB 14.4 01/17/2016   HCT 43.2 01/17/2016   MCV 90.2 01/17/2016   PLT 226 01/17/2016      Component Value Date/Time   NA 138 01/30/2016 1655   NA 138 10/25/2015 0918   K 4.0 01/30/2016 1655   CL 101 01/30/2016 1655   CO2 28 01/30/2016 1655   GLUCOSE 131* 01/30/2016 1655   GLUCOSE 130* 10/25/2015 0918   BUN 19 01/30/2016 1655   BUN 18 10/25/2015 0918   CREATININE 1.08 01/30/2016 1655   CREATININE 0.95 10/25/2015 0918   CALCIUM 9.2 01/30/2016 1655   PROT 7.2 01/17/2016 1501   PROT 6.7 10/25/2015 0918   ALBUMIN 4.1 01/17/2016 1501   ALBUMIN 4.2 10/25/2015 0918   AST 16 01/17/2016 1501   ALT 16 01/17/2016 1501   ALKPHOS 69 01/17/2016 1501   BILITOT 0.2 01/17/2016 1501   BILITOT <0.2 10/25/2015 0918   GFRNONAA 74  01/30/2016 1655   GFRNONAA 86 10/25/2015 0918   GFRAA 85 01/30/2016 1655   GFRAA 99 10/25/2015 0918       ASSESSMENT AND PLAN 60 y.o. year old male  has a past medical history of Seizures ([place]); Hypertension; GERD (gastroesophageal reflux disease); Dyslipidemia; Schizoaffective disorder; Depression; Anxiety; MRSA carrier; Constipation; Memory loss; and Hearing loss. here with:  1. Seizures  Overall the patient is doing well. He will continue on Dilantin 250 mg at bedtime as well as Vimpat 150 mg twice a day. I will check blood work today. Patient advised that if he has any seizure events he should let us know. He will follow-up in 6 months or sooner if needed.     Ward Givens, MSN, NP-C 03/28/2016, 3:01 PM [facility] [address] [place], [place] [address] [phone]

## 2016-03-28 NOTE — Progress Notes (Signed)
I have read the note, and I agree with the clinical assessment and plan.  Ozie Dimaria KEITH   

## 2016-03-29 ENCOUNTER — Telehealth: Payer: Self-pay | Admitting: Adult Health

## 2016-03-29 LAB — COMPREHENSIVE METABOLIC PANEL
ALT: 21 IU/L (ref 0–44)
AST: 20 IU/L (ref 0–40)
Albumin/Globulin Ratio: 1.6 (ref 1.2–2.2)
Albumin: 4.4 g/dL (ref 3.6–4.8)
Alkaline Phosphatase: 97 IU/L (ref 39–117)
BUN/Creatinine Ratio: 15 (ref 10–24)
BUN: 17 mg/dL (ref 8–27)
Bilirubin Total: 0.3 mg/dL (ref 0.0–1.2)
CO2: 22 mmol/L (ref 18–29)
Calcium: 9.4 mg/dL (ref 8.6–10.2)
Chloride: 94 mmol/L — ABNORMAL LOW (ref 96–106)
Creatinine, Ser: 1.1 mg/dL (ref 0.76–1.27)
GFR calc Af Amer: 83 mL/min/{1.73_m2} (ref >59)
GFR calc non Af Amer: 72 mL/min/{1.73_m2} (ref >59)
Globulin, Total: 2.8 g/dL (ref 1.5–4.5)
Glucose: 116 mg/dL — ABNORMAL HIGH (ref 65–99)
Potassium: 5 mmol/L (ref 3.5–5.2)
Sodium: 135 mmol/L (ref 134–144)
Total Protein: 7.2 g/dL (ref 6.0–8.5)

## 2016-03-29 LAB — CBC WITH DIFFERENTIAL/PLATELET
Basophils Absolute: 0 10*3/uL (ref 0.0–0.2)
Basos: 0 %
EOS (ABSOLUTE): 0.1 10*3/uL (ref 0.0–0.4)
Eos: 1 %
Hematocrit: 48.4 % (ref 37.5–51.0)
Hemoglobin: 16.6 g/dL (ref 12.6–17.7)
Immature Grans (Abs): 0 10*3/uL (ref 0.0–0.1)
Immature Granulocytes: 0 %
Lymphocytes Absolute: 1.7 10*3/uL (ref 0.7–3.1)
Lymphs: 13 %
MCH: 32.4 pg (ref 26.6–33.0)
MCHC: 34.3 g/dL (ref 31.5–35.7)
MCV: 95 fL (ref 79–97)
Monocytes Absolute: 0.7 10*3/uL (ref 0.1–0.9)
Monocytes: 5 %
Neutrophils Absolute: 10.2 10*3/uL — ABNORMAL HIGH (ref 1.4–7.0)
Neutrophils: 81 %
Platelets: 230 10*3/uL (ref 150–379)
RBC: 5.12 x10E6/uL (ref 4.14–5.80)
RDW: 15.8 % — ABNORMAL HIGH (ref 12.3–15.4)
WBC: 12.7 10*3/uL — ABNORMAL HIGH (ref 3.4–10.8)

## 2016-03-29 LAB — PHENYTOIN LEVEL, TOTAL: Phenytoin (Dilantin), Serum: 11.8 ug/mL (ref 10.0–20.0)

## 2016-03-29 NOTE — Telephone Encounter (Signed)
-----   Message from Ward Givens, NP sent at 03/29/2016  7:53 AM EDT ----- Lab work ok. Please call patient and send to PCP.

## 2016-03-29 NOTE — Progress Notes (Signed)
Quick Note:  Called Patient's wife and left message labs were ok. I will fax a copy to PCP Chrisandra Netters telephone [phone] fax [phone]. ______

## 2016-03-29 NOTE — Telephone Encounter (Signed)
Called Patient's wife and left message labs were ok and I will send a copy to PCP - Chrisandra Netters telephone [phone] fax [phone].

## 2016-04-03 ENCOUNTER — Encounter (HOSPITAL_BASED_OUTPATIENT_CLINIC_OR_DEPARTMENT_OTHER): Payer: Self-pay

## 2016-04-05 ENCOUNTER — Ambulatory Visit (INDEPENDENT_AMBULATORY_CARE_PROVIDER_SITE_OTHER): Payer: Medicaid Other | Admitting: Clinical

## 2016-04-05 DIAGNOSIS — F251 Schizoaffective disorder, depressive type: Secondary | ICD-10-CM

## 2016-04-10 ENCOUNTER — Ambulatory Visit (HOSPITAL_BASED_OUTPATIENT_CLINIC_OR_DEPARTMENT_OTHER): Payer: Medicaid Other | Attending: Family Medicine | Admitting: Internal Medicine

## 2016-04-10 DIAGNOSIS — R0683 Snoring: Secondary | ICD-10-CM | POA: Insufficient documentation

## 2016-04-10 DIAGNOSIS — R5383 Other fatigue: Secondary | ICD-10-CM | POA: Insufficient documentation

## 2016-04-10 DIAGNOSIS — R0600 Dyspnea, unspecified: Secondary | ICD-10-CM

## 2016-04-10 DIAGNOSIS — G4733 Obstructive sleep apnea (adult) (pediatric): Secondary | ICD-10-CM | POA: Diagnosis not present

## 2016-04-10 DIAGNOSIS — F513 Sleepwalking [somnambulism]: Secondary | ICD-10-CM | POA: Diagnosis not present

## 2016-04-10 DIAGNOSIS — G4736 Sleep related hypoventilation in conditions classified elsewhere: Secondary | ICD-10-CM | POA: Insufficient documentation

## 2016-04-10 DIAGNOSIS — Z79899 Other long term (current) drug therapy: Secondary | ICD-10-CM | POA: Insufficient documentation

## 2016-04-11 NOTE — Progress Notes (Signed)
   THERAPIST PROGRESS NOTE  Session Time: 2:32 -3:28  Participation Level: Active  Behavioral Response: DisheveledAlertDepressed  Type of Therapy: Individual Therapy  Treatment Goals addressed: improve psychiatric symptoms, Elevate mood, improve faulty thinking, interpersonal relationship skills  Interventions: CBT and Motivational Interviewing  Summary: [name] is a 60 y.o male who presents with Schizoaffective disorder, depressive type. .   Suicidal/Homicidal: No -without intent/plan  Therapist Response: Joseph met clinician for an individual session. [name] discussed his psychiatric symptoms and his current life events. [name] shared that he continues to be emotionally numb. He shared he would like to feel more emotions. Clinician asked open need questions and  [name]  discussed some of the times [name] had felt strong emotions in the past both pleasant and unpleasant. Client and clinician his routine. He shared that he did not leave the house except to go to his friends. He shared he does not bathe or clean up and then it becomes overwhelming. Client and clinician discussed how keeping a routine  Could help improve his mood. Client and clinician discussed how to break big goals like cleaning a room could be broken down into small more manageable steps. Client and clinician discussed how our thoughts affect the way we feel and respond to situations. [name] shared if he thought of the tasks as much smaller then they might be manageable but unlikely to be pleasurable. [name] shared about his history with seizures starting about 20 years ago (hasn't had one in about a year.) He shared he was concerned that it affected his memory. Clinician shared that it may have. He shared he has talked to his doctor about his memory. [name] shared about his relationship with his friend. Client and clinician discussed healthy relationship skills. [name] agreed to try some of the things discussed in therapy.    Plan: Return again in  1-2 weeks.  Diagnosis: Axis I: Schizoaffective disorder, depressive type.    Khalise Billard A, LCSW 04/05/2016

## 2016-04-13 ENCOUNTER — Encounter (HOSPITAL_COMMUNITY): Payer: Self-pay | Admitting: Clinical

## 2016-04-19 ENCOUNTER — Ambulatory Visit (HOSPITAL_COMMUNITY): Payer: Self-pay | Admitting: Clinical

## 2016-04-20 DIAGNOSIS — R0600 Dyspnea, unspecified: Secondary | ICD-10-CM | POA: Diagnosis not present

## 2016-04-20 NOTE — Procedures (Signed)
   Patient Name: [name], [name] Date: 04/10/2016 Gender: Male D.O.B: 1956-08-02 Age (years): 12 Referring Provider: Leeanne Rio Height (inches): 71 Interpreting Physician: Baird Lyons MD, ABSM Weight (lbs): 201 RPSGT: Earney [place] BMI: 28 MRN: [id number] Neck Size: 17.50 CLINICAL INFORMATION Sleep Study Type: NPSG Indication for sleep study: Fatigue, Sleep walking/talking/parasomnias, Snoring Epworth Sleepiness Score: 0  SLEEP STUDY TECHNIQUE As per the AASM Manual for the Scoring of Sleep and Associated Events v2.3 (April 2016) with a hypopnea requiring 4% desaturations. The channels recorded and monitored were frontal, central and occipital EEG, electrooculogram (EOG), submentalis EMG (chin), nasal and oral airflow, thoracic and abdominal wall motion, anterior tibialis EMG, snore microphone, electrocardiogram, and pulse oximetry.  MEDICATIONS Patient's medications include: charted for review Medications self-administered by patient during sleep study :[place], [place], [place], [place], [place], [place].  SLEEP ARCHITECTURE The study was initiated at 10:11:22 PM and ended at 4:37:50 AM. Sleep onset time was 114.8 minutes and the sleep efficiency was 66.9%. The total sleep time was 258.7 minutes. Stage REM latency was 245.0 minutes. The patient spent 1.35% of the night in stage N1 sleep, 92.08% in stage N2 sleep, 0.00% in stage N3 and 6.57% in REM. Alpha intrusion was absent. Supine sleep was 50.45%.  RESPIRATORY PARAMETERS The overall apnea/hypopnea index (AHI) was 5.6 per hour. There were 4 total apneas, including 4 obstructive, 0 central and 0 mixed apneas. There were 20 hypopneas and 6 RERAs. The AHI during Stage REM sleep was 35.3 per hour. AHI while supine was 5.1 per hour. The mean oxygen saturation was 90.08%. The minimum SpO2 during sleep was 80.00%. Loud snoring was noted during this study.  CARDIAC DATA The 2 lead EKG demonstrated  sinus rhythm. The mean heart rate was 75.79 beats per minute. Other EKG findings include: None.  LEG MOVEMENT DATA The total PLMS were 4 with a resulting PLMS index of 0.93. Associated arousal with leg movement index was 0.0 .  IMPRESSIONS - Mild obstructive sleep apnea occurred during this study (AHI = 5.6/h). - Insufficient early events to qualify for split CPAP protocol - No significant central sleep apnea occurred during this study (CAI = 0.0/h). - Moderate oxygen desaturation was noted during this study (Min O2 = 80.00%). - The patient snored with Loud snoring volume. - No cardiac abnormalities were noted during this study. - Clinically significant periodic limb movements did not occur during sleep. No significant associated arousals.  DIAGNOSIS - Obstructive Sleep Apnea (327.23 [G47.33 ICD-10]) - Nocturnal Hypoxemia (327.26 [G47.36 ICD-10])  RECOMMENDATIONS - Very mild obstructive sleep apnea. Return to discuss treatment options. - Avoid alcohol, sedatives and other CNS depressants that may worsen sleep apnea and disrupt normal sleep architecture. - Sleep hygiene should be reviewed to assess factors that may improve sleep quality. - Weight management and regular exercise should be initiated or continued if appropriate.  Deneise Lever Diplomate, American Board of Sleep Medicine  ELECTRONICALLY SIGNED ON:  04/20/2016, 3:12 PM [place] PH: [phone]   FX: [phone] [place]

## 2016-04-24 ENCOUNTER — Ambulatory Visit (HOSPITAL_COMMUNITY): Payer: Self-pay | Admitting: Clinical

## 2016-04-25 ENCOUNTER — Other Ambulatory Visit: Payer: Self-pay | Admitting: Neurology

## 2016-04-25 ENCOUNTER — Other Ambulatory Visit: Payer: Self-pay | Admitting: *Deleted

## 2016-04-25 ENCOUNTER — Other Ambulatory Visit (HOSPITAL_COMMUNITY): Payer: Self-pay | Admitting: Psychiatry

## 2016-04-25 NOTE — Telephone Encounter (Signed)
Red team, Please call patient's neuro office and ask them to send in refills on these medications   He should not be "double dipping" by having seizure medications written by 2 offices - he is at risk for oversedation from excess medication & has been admitted for this multiple times in the past. All seizure medications should come from one provider (his neurologist) to avoid confusion.  Leeanne Rio, MD

## 2016-04-25 NOTE — Telephone Encounter (Signed)
Patient states that he has been getting this filled by [place] for over a decade and just recently started seeing a neurologist.  States that neuroloist started him on vimpat (99month worth) and wants him to take this along with the dilantin.  Patient understands if you want his neurologist to write this and if so, please call the neurology office to have them write it ASAP so he doesn't run out. Jeannine Pennisi,CMA

## 2016-04-26 ENCOUNTER — Other Ambulatory Visit: Payer: Self-pay | Admitting: Neurology

## 2016-04-26 NOTE — Telephone Encounter (Signed)
Called them and they sent a message to his doctor to refill his medicines. Westley Blass Kennon Holter, CMA

## 2016-05-01 ENCOUNTER — Telehealth (HOSPITAL_COMMUNITY): Payer: Self-pay

## 2016-05-01 ENCOUNTER — Other Ambulatory Visit (HOSPITAL_COMMUNITY): Payer: Self-pay | Admitting: Psychiatry

## 2016-05-01 DIAGNOSIS — F251 Schizoaffective disorder, depressive type: Secondary | ICD-10-CM

## 2016-05-01 MED ORDER — CLONAZEPAM 1 MG PO TABS: 1.0000 mg | ORAL_TABLET | Freq: Three times a day (TID) | ORAL | Status: DC

## 2016-05-01 NOTE — Telephone Encounter (Signed)
Patient called and he would like a refill on his Klonopin. He has a follow up on 7/6 - please review and advise, thank you

## 2016-05-01 NOTE — Telephone Encounter (Signed)
Okay to refill? 

## 2016-05-01 NOTE — Telephone Encounter (Signed)
Called in order for one month - I called the patient and let him know.

## 2016-05-02 ENCOUNTER — Telehealth: Payer: Self-pay | Admitting: *Deleted

## 2016-05-02 NOTE — Telephone Encounter (Signed)
Patient called stating his blood pressures have been elevated recently 150s/100s.  Patient has been taking at least 7-8 blood pressure pills throughout the day to help lower his blood pressure.  Advised patient that if he has concerns regarding his blood pressure and medications, then he need an appointment with his PCP.  Appointment scheduled for tomorrow 05/03/16 at 4:15 PM with PCP.  Derl Barrow, RN

## 2016-05-03 ENCOUNTER — Encounter (HOSPITAL_COMMUNITY): Payer: Self-pay | Admitting: Clinical

## 2016-05-03 ENCOUNTER — Encounter: Payer: Self-pay | Admitting: Family Medicine

## 2016-05-03 ENCOUNTER — Ambulatory Visit (INDEPENDENT_AMBULATORY_CARE_PROVIDER_SITE_OTHER): Payer: Medicaid Other | Admitting: Family Medicine

## 2016-05-03 ENCOUNTER — Ambulatory Visit (INDEPENDENT_AMBULATORY_CARE_PROVIDER_SITE_OTHER): Payer: Medicaid Other | Admitting: Clinical

## 2016-05-03 VITALS — BP 133/82 | HR 90 | Temp 98.4°F | Wt 204.0 lb

## 2016-05-03 DIAGNOSIS — F251 Schizoaffective disorder, depressive type: Secondary | ICD-10-CM

## 2016-05-03 DIAGNOSIS — I10 Essential (primary) hypertension: Secondary | ICD-10-CM | POA: Diagnosis not present

## 2016-05-03 DIAGNOSIS — E291 Testicular hypofunction: Secondary | ICD-10-CM

## 2016-05-03 MED ORDER — HYDROCHLOROTHIAZIDE 12.5 MG PO TABS: 12.5000 mg | ORAL_TABLET | Freq: Every day | ORAL | Status: DC

## 2016-05-03 NOTE — Progress Notes (Signed)
Date of Visit: 05/03/2016   HPI:  Patient presents for same day appointment to discuss blood pressure issues. Blood pressure has been elevated at home. Checks blood pressure several times per day and has gotten up to 178/112 at home. When blood pressure has read as high, he takes an additional metoprolol succinate 50mg  pill. He is supposed to take just one pill per day. Thinks blood pressure has gradually gotten higher over the last several months.  Yesterday he took 4 total metoprolol 50mg  pills Today has taken 2 pills One day recently he took 7-8 pills  Feels as though they don't work to lower his blood pressure until he eats food, at which point his blood pressure lowers and he gets very drowsy. Denies chest pain, shortness of breath. Occasionally feels like heart beats quickly.   Also reports family history of diabetes and wants to be checked for this.  ROS: See HPI.  [place]: history of schizoaffective disorder, hyperlipidemia, seizure disorder, constipation, androgen deficiency  PHYSICAL EXAM: BP 133/82 mmHg  Pulse 90  Temp(Src) 98.4 F (36.9 C) (Oral)  Wt 204 lb (92.534 kg) Gen: NAD, pleasant, cooperative HEENT: normocephalic, atraumatic, moist mucous membranes  Heart: regular rate and rhythm, no murmur Lungs: clear to auscultation bilaterally, normal work of breathing  Neuro: alert, grossly nonfocal, speech normal Ext: No appreciable lower extremity edema bilaterally   ASSESSMENT/PLAN:  Essential hypertension Difficult situation - has been taking excessive doses of metoprolol at home to control elevated blood pressures but blood pressure today is normal He is likely tolerant to this medication as pulse is actually somewhat high (90) Advised strongly AGAINST taking any additional medications other than what he is prescribed He will resume 50mg  of metoprolol succinate daily Add HCTZ 12.5mg  daily Follow up in 1 week to see how blood pressure is doing and check  labs.  Androgen deficiency Will plan to check testosterone level, PSA etc when patient returns for follow up visit. It will be nearly 1 week after injection if he comes Monday or Tuesday of next week Advised scheduling morning visit.  Family history of diabetes - will check A1c at next visit when we draw labs. Prior glucoses mildly elevated so A1c is warranted.  FOLLOW UP: Follow up in 1 week for above issues  [place] J. Ardelia Mems, [place]

## 2016-05-03 NOTE — Progress Notes (Signed)
   THERAPIST PROGRESS NOTE  Session Time: 1:33 -2:28  Participation Level: Active  Behavioral Response: NeatAlertDepressed  Type of Therapy: Individual Therapy  Treatment Goals addressed: improve psychiatric symptoms, Elevate mood, improve faulty thinking,   Interventions:  Motivational Interviewing  Summary: [name] is a 60 y.o male who presents with Schizoaffective disorder, depressive type. .   Suicidal/Homicidal: No -without intent/plan  Therapist Response: Kaydence met clinician for an individual session. [name] discussed his psychiatric symptoms, his current life events and his homework. [name] shared that he continues to feel as if he does not experience emotions, he feels and emotionally numb. [name] shared that he worked to exercise 5 minutes a day but did not feel any improvement. He reports that he did this for times a week. [name] shared that he did not work on his goal of cleaning up his house a little bit.Marland Kitchen He shared that he felt nervous about making these changes because in the past he had exercise and then stopped and his emotional numbness got worse. [name] brought a list of 4 things that he felt clinician should know 1. He feels the emotional numbness is caused by his body shutting down because of emotional pain. 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 The seizures began in college. And he has not had one for a year but he thinks that they are messing with his memory. 3. That in 12th grade he transcended duality and later went to [place] to find guru and that he has a picture of him in front of a nuclear bomb shaped cloud. 4. That he had been confused about his gender identity in the past. He shared he was taking hormones and then had to get breast reduction, later he had his cut off his own testicles.         He shared that these for things were probably the cause of his depression and difficulty relating to others. Clinician asked open ended questions about his experience with   emotional numbness and emotional pain. Sebastion shared that he couldn't really describe his emotional pain. He shared that he just gets numb when he thinks about it. Client and clinician discussed his belief that his numbness increases when he makes efforts change it. [name] gave some examples. Clinician asked what would happen for [name] is he continues to spend most of his time in bed. He shared that he week and not do so well. [name] agreed to continue exercising 5-10 minutes a day 4 days a week and to do the steps discussed in last session to clean his house a little bit. [name] shared would not enjoy doing the things, but that he would do them. [name] shared he is still spending time with his friend while he can't say that he enjoys it does say they get along.   Plan: Return again in 1-2 weeks.  Diagnosis: Axis I: Schizoaffective disorder, depressive type.      Arion Shankles A, LCSW 05/03/2016

## 2016-05-03 NOTE — Patient Instructions (Signed)
Take HCTZ 12.5mg  once per day Sent this in for you  Please only take the 50mg  once per day of metoprolol If you have questions about your blood pressure call us before taking extra medicines  Check blood pressure once per day  Follow up with me in 1 week to see how blood pressure is doing and also to check labwork.

## 2016-05-06 NOTE — Assessment & Plan Note (Signed)
Will plan to check testosterone level, PSA etc when patient returns for follow up visit. It will be nearly 1 week after injection if he comes Monday or Tuesday of next week Advised scheduling morning visit.

## 2016-05-06 NOTE — Assessment & Plan Note (Signed)
Difficult situation - has been taking excessive doses of metoprolol at home to control elevated blood pressures but blood pressure today is normal He is likely tolerant to this medication as pulse is actually somewhat high (90) Advised strongly AGAINST taking any additional medications other than what he is prescribed He will resume 50mg  of metoprolol succinate daily Add HCTZ 12.5mg  daily Follow up in 1 week to see how blood pressure is doing and check labs.

## 2016-05-08 ENCOUNTER — Ambulatory Visit: Payer: Self-pay | Admitting: Family Medicine

## 2016-05-28 ENCOUNTER — Ambulatory Visit (HOSPITAL_COMMUNITY): Payer: Self-pay | Admitting: Clinical

## 2016-05-29 ENCOUNTER — Other Ambulatory Visit (HOSPITAL_COMMUNITY): Payer: Self-pay | Admitting: Psychiatry

## 2016-05-29 NOTE — Telephone Encounter (Signed)
Patient received an order for the Klonopin on 5/16 for 30 days. He has a follow up on 7/6 - okay to refill?

## 2016-05-30 ENCOUNTER — Telehealth (HOSPITAL_COMMUNITY): Payer: Self-pay

## 2016-05-30 ENCOUNTER — Other Ambulatory Visit (HOSPITAL_COMMUNITY): Payer: Self-pay | Admitting: Psychiatry

## 2016-05-30 DIAGNOSIS — F251 Schizoaffective disorder, depressive type: Secondary | ICD-10-CM

## 2016-05-30 NOTE — Telephone Encounter (Signed)
We can refill 30 days supply for Klonopin with no early refills.

## 2016-05-30 NOTE — Telephone Encounter (Signed)
Patient calling for a refill on Klonopin, he has a follow up on 7/6. Last order was sent in on 5/13 for 30 days. Please review and advise, thank you

## 2016-05-31 MED ORDER — CLONAZEPAM 1 MG PO TABS: 1.0000 mg | ORAL_TABLET | Freq: Three times a day (TID) | ORAL | Status: DC

## 2016-05-31 NOTE — Telephone Encounter (Signed)
Called in a one month order to the pharmacy

## 2016-06-05 ENCOUNTER — Ambulatory Visit (INDEPENDENT_AMBULATORY_CARE_PROVIDER_SITE_OTHER): Payer: Medicaid Other | Admitting: Clinical

## 2016-06-05 ENCOUNTER — Encounter (HOSPITAL_COMMUNITY): Payer: Self-pay | Admitting: Clinical

## 2016-06-05 DIAGNOSIS — F251 Schizoaffective disorder, depressive type: Secondary | ICD-10-CM | POA: Diagnosis not present

## 2016-06-05 NOTE — Progress Notes (Signed)
   THERAPIST PROGRESS NOTE  Session Time: 1:32 - 2:28  Participation Level: Active  Behavioral Response: CasualAlertDepressed  Type of Therapy: Individual Therapy  Treatment Goals addressed: improve psychiatric symptoms, Elevate mood, improve faulty thinking, interpersonal relationship skills  Interventions: CBT and Motivational Interviewing  Summary: [name] is a 60 y.o male who presents with Schizoaffective disorder, depressive type. .   Suicidal/Homicidal: No -without intent/plan  Therapist Response: [name] met clinician for an individual session. [name] discussed his psychiatric symptoms, his current life events and his homework. [name] shared that he continues to feel emotionally numb, but clinician observed a slight improvement during this session. He appeared more relaxed. He shared that he has been working to keep a better sleep/wake routine. He shared that he has bee working to get out of bed and to be more active. He has been exercising every 2-3 days (riding his exercise bike). He shared that he decided to do it in front of the tv (to distract himself)so he could keep his commitment. [name] and clinician discussed the benefits of exercise for his health and mental health.He stated he has been cleaning a little more too as agreed to last session. He shared he has been spending more time with his girl friend. Clinician asked open ended questions and [name] shared that he doesn't feel as lonely. Clinician shared that less lonely, is a step away from emotionally numb. Client and clinician discussed interpersonal relationship skills, such as expressing appreciation and giving and receiving compliments. [name] also shared a note from an ex-friends father that told him of his death and asked him to call. [name] shared he did not have many emotions about the news but did not know if he should call and if he did what he should say. Clinician asked open ended questions and [name] shared what he would want to say.  Clinician had him role play it. [name] then wrote down the key points and said he would call him. [name] agreed to continue to exercise, pick up and practice his interpersonal relationship skills.      Plan: Return again in 1-2 weeks.  Diagnosis: Axis I: Schizoaffective disorder, depressive type.    Ashraf Mesta A, LCSW 06/05/2016

## 2016-06-11 ENCOUNTER — Encounter: Payer: Self-pay | Admitting: Family Medicine

## 2016-06-11 ENCOUNTER — Ambulatory Visit (INDEPENDENT_AMBULATORY_CARE_PROVIDER_SITE_OTHER): Payer: Medicaid Other | Admitting: Family Medicine

## 2016-06-11 VITALS — BP 124/78 | HR 80 | Temp 98.4°F | Ht 71.0 in | Wt 202.0 lb

## 2016-06-11 DIAGNOSIS — I10 Essential (primary) hypertension: Secondary | ICD-10-CM

## 2016-06-11 DIAGNOSIS — E291 Testicular hypofunction: Secondary | ICD-10-CM | POA: Diagnosis not present

## 2016-06-11 DIAGNOSIS — G40909 Epilepsy, unspecified, not intractable, without status epilepticus: Secondary | ICD-10-CM | POA: Diagnosis not present

## 2016-06-11 DIAGNOSIS — B078 Other viral warts: Secondary | ICD-10-CM

## 2016-06-11 DIAGNOSIS — B079 Viral wart, unspecified: Secondary | ICD-10-CM

## 2016-06-11 DIAGNOSIS — Z1159 Encounter for screening for other viral diseases: Secondary | ICD-10-CM | POA: Diagnosis not present

## 2016-06-11 DIAGNOSIS — L570 Actinic keratosis: Secondary | ICD-10-CM

## 2016-06-11 DIAGNOSIS — G4730 Sleep apnea, unspecified: Secondary | ICD-10-CM

## 2016-06-11 LAB — BASIC METABOLIC PANEL WITH GFR
BUN: 22 mg/dL (ref 7–25)
CO2: 27 mmol/L (ref 20–31)
Calcium: 9 mg/dL (ref 8.6–10.3)
Chloride: 105 mmol/L (ref 98–110)
Creat: 0.81 mg/dL (ref 0.70–1.25)
GFR, Est African American: >89 mL/min (ref >=60)
GFR, Est Non African American: >89 mL/min (ref >=60)
Glucose, Bld: 131 mg/dL — ABNORMAL HIGH (ref 65–99)
Potassium: 4.2 mmol/L (ref 3.5–5.3)
Sodium: 138 mmol/L (ref 135–146)

## 2016-06-11 LAB — POCT GLYCOSYLATED HEMOGLOBIN (HGB A1C): Hemoglobin A1C: 5.6

## 2016-06-11 MED ORDER — ALDARA 5 % EX CREA: TOPICAL_CREAM | Freq: Every day | CUTANEOUS | Status: DC

## 2016-06-11 NOTE — Progress Notes (Signed)
Date of Visit: 06/11/2016   HPI:  Patient presents for follow up and to discuss:  - Letter needed - needs letter saying he is medically able to drive so that he can use funds left to him by his father in order to buy a car. The attorney who is protecting the funds needs a letter saying medically it is ok for him to drive before the funds will be relinquished. Has been seizure free for over 1 year now.  - Sleep study - wants to review results. Study showed very mild OSA.   - wart on finger - has tried over the counter topical medications as well as had it frozen here before, not helping. Wants to know if there's a medication he can apply daily to help it go away  - blood pressure - taking metoprolol twice daily as instructed, also HCTZ. Doing well.  - hypogonadism - taking testosterone 400mg  q2 weeks. He was due 6 days ago for an injection but missed it as he was staying with a friend. Thus, last shot was approximately 20 days ago.   - lesion on forehead - notes a dry spot on his forehead that doesn't seem to go away. Has not seen dermatology for it. Has other dry spots on his arms.  ROS: See HPI.  [place]: androgen deficiency, hypertension, hyperlipidemia, overweight, seizure disorder, chronic constipation  PHYSICAL EXAM: BP 124/78 mmHg  Pulse 80  Temp(Src) 98.4 F (36.9 C) (Oral)  Ht 5\' 11"  (1.803 m)  Wt 202 lb (91.627 kg)  BMI 28.19 kg/m2 Gen: NAD, pleasant, cooperative HEENT: normocephalic, atraumatic, moist mucous membranes  Neuro: alert grossly nonfocal, speech normal Ext:  Rectal: normal rectal tone. Prostate relatively small, but no nodules. Smooth, non boggy, non tender.  ASSESSMENT/PLAN:  Health maintenance:  -PSA today for monitoring with testosterone replacement  -hep C antibody with labs today for routine screening  Androgen deficiency Unfortunately, patient has repeatedly failed to present for labwork that allows for optimal testing of his testosterone level. He  again today arrives 20 days after his last injection, and is past due by 6 days. As it has been quite a while since his level was measured will go ahead and get level today just for safety's sake. Also check PSA. Rectal exam performed after labs were drawn, with normal sized prostate.  Essential hypertension Well controlled. Continue current regimen. Check BMET today. Also check A1c given family history of diabetes   Mild sleep apnea Very mild OSA by recent sleep study. Refer to sleep physician (Dr. Annamaria Boots, who read his sleep study) to discuss further evaluation & management.  [facility] rx for aldara. Follow up in 6-8 weeks to see how this is doing.  Actinic keratosis Multiple AK's noted on scalp & arms Refer to dermatology for management  Seizure disorder [facility]) Remains seizure free Letter written saying there is no medical contraindication to driving at this time.   FOLLOW UP: Follow up in 2 mos for above issues Referring to derm Referring to sleep medicine  [place] J. Ardelia Mems, [place]

## 2016-06-11 NOTE — Patient Instructions (Signed)
Sleep study: - very mild sleep apnea - referring to sleep doctor  Rough spots on skin: - likely actinic keratoses - sending to dermatology  Car issues: - wrote letter saying medically you are able to drive  Low testosterone: - checking testosterone & prostate level today  Blood pressure: - looks good - checking kidney function & electrolytes  Wart: - sent in Aldara (topical medicine)  Follow up with me in 2 months to follow up on these issues.  Be well, Dr. Ardelia Mems

## 2016-06-12 LAB — HEPATITIS C ANTIBODY: HCV Ab: NEGATIVE

## 2016-06-12 LAB — TESTOSTERONE TOTAL,FREE,BIO, MALES
Albumin: 4.3 g/dL (ref 3.6–5.1)
Sex Hormone Binding: 31 nmol/L (ref 22–77)
Testosterone, Bioavailable: 30.3 ng/dL — ABNORMAL LOW (ref 130.5–681.7)
Testosterone, Free: 15.4 pg/mL — ABNORMAL LOW (ref 47.0–244.0)
Testosterone: 121 ng/dL — ABNORMAL LOW (ref 250–827)

## 2016-06-12 LAB — PSA: PSA: 2.41 ng/mL (ref <=4.00)

## 2016-06-13 ENCOUNTER — Telehealth: Payer: Self-pay | Admitting: *Deleted

## 2016-06-13 NOTE — Telephone Encounter (Signed)
Completed, will return to Tamika RN Anaisa Radi J Devesh Monforte, MD  

## 2016-06-13 NOTE — Telephone Encounter (Signed)
[facility] Authorization [facility] for Imiquimod 5% cream. Formulary preferred is Aldara cream.  [facility]ed [facility] to run claim as brand name; however it still saying prior authorization is needed.  PA form placed in provider box for review.   Derl Barrow, RN

## 2016-06-14 NOTE — Telephone Encounter (Signed)
Called [employer] regarding the PA for Aldara.  Per [employer] presentative the brand is preferred; called [facility] and informed the pharmacist.  Derl Barrow, RN

## 2016-06-15 ENCOUNTER — Telehealth: Payer: Self-pay | Admitting: Family Medicine

## 2016-06-15 DIAGNOSIS — E291 Testicular hypofunction: Secondary | ICD-10-CM

## 2016-06-15 DIAGNOSIS — G4730 Sleep apnea, unspecified: Secondary | ICD-10-CM | POA: Insufficient documentation

## 2016-06-15 DIAGNOSIS — L570 Actinic keratosis: Secondary | ICD-10-CM | POA: Insufficient documentation

## 2016-06-15 DIAGNOSIS — R972 Elevated prostate specific antigen [PSA]: Secondary | ICD-10-CM

## 2016-06-15 NOTE — Assessment & Plan Note (Addendum)
rx for aldara. Follow up in 6-8 weeks to see how this is doing.

## 2016-06-15 NOTE — Assessment & Plan Note (Signed)
Remains seizure free Letter written saying there is no medical contraindication to driving at this time.

## 2016-06-15 NOTE — Assessment & Plan Note (Signed)
Very mild OSA by recent sleep study. Refer to sleep physician (Dr. Annamaria Boots, who read his sleep study) to discuss further evaluation & management.

## 2016-06-15 NOTE — Telephone Encounter (Signed)
Attempted to reach patient to discuss lab results. No answer on either line. No VM set up on mobile. Left VM on home number asking him to call back.  His testosterone level is low, and PSA is within normal range but is higher than before. I recommend he sees urology to discuss this in more detail.  When he returns call, please page me so I can speak with him about his results.  Leeanne Rio, MD

## 2016-06-15 NOTE — Assessment & Plan Note (Signed)
Unfortunately, patient has repeatedly failed to present for labwork that allows for optimal testing of his testosterone level. He again today arrives 20 days after his last injection, and is past due by 6 days. As it has been quite a while since his level was measured will go ahead and get level today just for safety's sake. Also check PSA. Rectal exam performed after labs were drawn, with normal sized prostate.

## 2016-06-15 NOTE — Assessment & Plan Note (Signed)
Multiple AK's noted on scalp & arms Refer to dermatology for management

## 2016-06-15 NOTE — Assessment & Plan Note (Addendum)
Well controlled. Continue current regimen. Check BMET today. Also check A1c given family history of diabetes

## 2016-06-21 ENCOUNTER — Ambulatory Visit (INDEPENDENT_AMBULATORY_CARE_PROVIDER_SITE_OTHER): Payer: Medicaid Other | Admitting: Psychiatry

## 2016-06-21 ENCOUNTER — Encounter (HOSPITAL_COMMUNITY): Payer: Self-pay | Admitting: Psychiatry

## 2016-06-21 VITALS — BP 132/84 | HR 111 | Ht 71.0 in | Wt 206.4 lb

## 2016-06-21 DIAGNOSIS — F251 Schizoaffective disorder, depressive type: Secondary | ICD-10-CM

## 2016-06-21 MED ORDER — GABAPENTIN 100 MG PO CAPS: 100.0000 mg | ORAL_CAPSULE | Freq: Four times a day (QID) | ORAL | Status: DC

## 2016-06-21 MED ORDER — QUETIAPINE FUMARATE 300 MG PO TABS: 600.0000 mg | ORAL_TABLET | Freq: Every day | ORAL | Status: DC

## 2016-06-21 MED ORDER — AMITRIPTYLINE HCL 150 MG PO TABS: 150.0000 mg | ORAL_TABLET | Freq: Every day | ORAL | Status: DC

## 2016-06-21 MED ORDER — CLONAZEPAM 1 MG PO TABS: 1.0000 mg | ORAL_TABLET | Freq: Three times a day (TID) | ORAL | Status: DC

## 2016-06-21 NOTE — Progress Notes (Signed)
[place] [phone] Progress Note  [name] [id number] 60 y.o.  06/21/2016 3:38 PM  Chief Complaint:  Medication management and follow-up.        History of Present Illness:  [name] came for his follow-up appointment.  He is taking his medication as prescribed.  Lately he was watching [place] faith healer speech on the Internet but it made him more anxious and nervous.  Despite taking medication he continues to feel some time paranoid and anxious.  He continued to believe that his brother trying to kill him.  His mother lives in [place] but he has no communication with his brother in recent years.  He feels medicine helps his anxiety and sleep.  He is taking gabapentin 100 mg 4 times a day and Klonopin 1 mg 3 times a day.  He denies any agitation, anger, mood swing.  He denies any suicidal thoughts or homicidal thought.  Recently he seen his primary care physician and he had blood work.  His hemoglobin A1c is normal.  His creatinine and also back to normal.  Patient denies any aggressive behavior but admitted sometime he feels sad, depressed and very isolated and does not want to talk with strangers.  He wants to continue his current psychiatric medication.  He has no tremors, shakes or any EPS.  Patient denies drinking or using any illegal substances.  He is seeing Tharon Aquas for counseling.  His appetite is okay.  His vital signs are stable.  Suicidal Ideation: No Plan Formed: No Patient has means to carry out plan: No  Homicidal Ideation: No Plan Formed: No Patient has means to carry out plan: No  Past Psychiatric History/Hospitalization(s): Patient has severe depression since his college.  He's been taking medication since 1984.  He has at least 15 psychiatric hospitalization due to severe depression, paranoia and suicidal thoughts.  His [facility] psychiatric [facility] at [facility] where he was admitted from July 27 to 07/21/2015.  He was thinking to asphyxiate on helium.   At that time he was taking Seroquel 900 mg at bedtime, amitriptyline 400 mg at bedtime and Klonopin 2 mg 4 times a day.  He had one history of suicidal attempt when he cut his testicle because he does not want to be depressed.  He admitted history of paranoia and negative thinking.  He was seeing at [name] at [facility] however it closed he moved to [name] to continue with [name].  He had tried numerous SSRIs however he always had opposite reaction with the SSRI.  He felt poor sleep, restlessness, irritability and unable to tolerate SSRIs.  In the past she had tried nortriptyline, Haldol, Geodon, Risperdal, lithium, Prozac, Wellbutrin , Paxil, Ativan, Lexapro and Zoloft.  He denies any history of mania.  Patient denies any history of drugs or alcohol use. Anxiety: No Bipolar Disorder: No Depression: Yes Mania: No Psychosis: Paranoia and negative thinking Schizophrenia: No Personality Disorder: No Hospitalization for psychiatric illness: No History of Electroconvulsive Shock Therapy: No Prior Suicide Attempts: Yes  Medical History; Patient has hypertension, GERD, hyperlipidemia, mild memory impairment, seizure disorder.  His primary care physician is [facility]. Patient also goes to [facility] for seizures management.  Family History; Patient reported his uncle had severe depression and committed suicide by killing himself  Education and Work History; Patient was in the college when he had severe depression and he has to leave the college.  He is on disability.  Psychosocial History;  Patient born and raised in [place].  He never married.  He has no children.  He has a good friend who he know for 15 years and very supportive.  His parents are deceased.  His father died 3 years ago.  He has one brother who live in [place].  Review of Systems  Constitutional: Negative.   Cardiovascular: Negative for chest pain and palpitations.   Musculoskeletal: Negative.   Skin: Negative for itching and rash.  Neurological: Negative for dizziness, tremors and headaches.  Psychiatric/Behavioral: Positive for memory loss. The patient is nervous/anxious.     Psychiatric: Agitation: No Hallucination: No Depressed Mood: Yes Insomnia: No Hypersomnia: No Altered Concentration: No Feels Worthless: No Grandiose Ideas: No Belief In Special Powers: No New/Increased Substance Abuse: No Compulsions: No  Neurologic: Headache: No Seizure: History of seizures Paresthesias: Yes   Outpatient Encounter Prescriptions as of 06/21/2016  Medication Sig  . ALDARA 5 % cream Apply topically at bedtime.  Marland Kitchen amitriptyline (ELAVIL) 150 MG tablet Take 1 tablet (150 mg total) by mouth at bedtime.  Marland Kitchen aspirin EC 81 MG tablet Take 1 tablet (81 mg total) by mouth daily.  . clonazePAM (KLONOPIN) 1 MG tablet Take 1 tablet (1 mg total) by mouth 3 (three) times daily.  . [facility] SENNA PLUS 8.6-50 MG tablet TAKE 1 TABLET BY MOUTH AT BEDTIME AS NEEDED FOR MILD CONSTIPATION  . gabapentin (NEURONTIN) 100 MG capsule Take 1 capsule (100 mg total) by mouth 4 (four) times daily.  . hydrochlorothiazide (HYDRODIURIL) 12.5 MG tablet Take 1 tablet (12.5 mg total) by mouth daily.  . Lacosamide (VIMPAT) 150 MG TABS Take 1 tablet (150 mg total) by mouth 2 (two) times daily.  . metoprolol succinate (TOPROL-XL) 50 MG 24 hr tablet TAKE 1 TABLET (50 MG TOTAL) BY MOUTH AT BEDTIME. TAKE WITH OR IMMEDIATELY FOLLOWING A MEAL.  Marland Kitchen phenytoin (DILANTIN) 100 MG ER capsule TAKE 2 CAPSULES BY MOUTH AT NIGHT  . phenytoin (DILANTIN) 50 MG tablet TAKE 1 TABLET BY MOUTH AT NIGHT  . polyethylene glycol (MIRALAX / GLYCOLAX) packet TAKE 17 GRAMS 2 TIMES A DAY AS NEEDED FOR CONSTIPATION. MIX INTO 8 OF FLUID AND DRINK  . QUEtiapine (SEROQUEL) 300 MG tablet Take 2 tablets (600 mg total) by mouth at bedtime.  . Selenium Sulf-Pyrithione-Urea 2.25 % SHAM APPLY TO AFFECTED AREA ON SCALP 3 TO 6 TIMES PER  WEEK  . senna ([facility] SENNA) 8.6 MG tablet Take 1 tablet (8.6 mg total) by mouth daily as needed for constipation.  . simvastatin (ZOCOR) 40 MG tablet TAKE 1 TABLET (40 MG TOTAL) BY MOUTH AT BEDTIME.  Marland Kitchen testosterone cypionate (DEPOTESTOSTERONE CYPIONATE) 200 MG/ML injection Inject 2 mLs (400 mg total) into the muscle every 14 (fourteen) days.  . [DISCONTINUED] amitriptyline (ELAVIL) 150 MG tablet Take 1 tablet (150 mg total) by mouth at bedtime.  . [DISCONTINUED] clonazePAM (KLONOPIN) 1 MG tablet Take 1 tablet (1 mg total) by mouth 3 (three) times daily.  . [DISCONTINUED] gabapentin (NEURONTIN) 100 MG capsule Take 1 capsule (100 mg total) by mouth 4 (four) times daily.  . [DISCONTINUED] QUEtiapine (SEROQUEL) 300 MG tablet Take 2 tablets (600 mg total) by mouth at bedtime.   No facility-administered encounter medications on file as of 06/21/2016.    Recent Results (from the past 2160 hour(s))  Comprehensive metabolic panel     Status: Abnormal   Collection Time: 03/28/16  3:06 PM  Result Value Ref Range   Glucose 116 (H) 65 - 99 mg/dL  BUN 17 8 - 27 mg/dL   Creatinine, Ser 1.10 0.76 - 1.27 mg/dL   GFR calc non Af Amer 72 >59 mL/min/1.73   GFR calc Af Amer 83 >59 mL/min/1.73   BUN/Creatinine Ratio 15 10 - 24   Sodium 135 134 - 144 mmol/L   Potassium 5.0 3.5 - 5.2 mmol/L   Chloride 94 (L) 96 - 106 mmol/L   CO2 22 18 - 29 mmol/L   Calcium 9.4 8.6 - 10.2 mg/dL   Total Protein 7.2 6.0 - 8.5 g/dL   Albumin 4.4 3.6 - 4.8 g/dL   Globulin, Total 2.8 1.5 - 4.5 g/dL   Albumin/Globulin Ratio 1.6 1.2 - 2.2   Bilirubin Total 0.3 0.0 - 1.2 mg/dL   Alkaline Phosphatase 97 39 - 117 IU/L   AST 20 0 - 40 IU/L   ALT 21 0 - 44 IU/L  Phenytoin level, total     Status: None   Collection Time: 03/28/16  3:06 PM  Result Value Ref Range   Phenytoin (Dilantin), Serum 11.8 10.0 - 20.0 ug/mL    Comment:                                 Neonatal:                                 Therapeutic 6.0 - 14.0                                  Detection Limit =  0.8                           <0.8 Indicates None Detected   CBC with Differential/Platelet     Status: Abnormal   Collection Time: 03/28/16  3:06 PM  Result Value Ref Range   WBC 12.7 (H) 3.4 - 10.8 x10E3/uL   RBC 5.12 4.14 - 5.80 x10E6/uL   Hemoglobin 16.6 12.6 - 17.7 g/dL   Hematocrit 48.4 37.5 - 51.0 %   MCV 95 79 - 97 fL   MCH 32.4 26.6 - 33.0 pg   MCHC 34.3 31.5 - 35.7 g/dL   RDW 15.8 (H) 12.3 - 15.4 %   Platelets 230 150 - 379 x10E3/uL   Neutrophils 81 %   Lymphs 13 %   Monocytes 5 %   Eos 1 %   Basos 0 %   Neutrophils Absolute 10.2 (H) 1.4 - 7.0 x10E3/uL   Lymphocytes Absolute 1.7 0.7 - 3.1 x10E3/uL   Monocytes Absolute 0.7 0.1 - 0.9 x10E3/uL   EOS (ABSOLUTE) 0.1 0.0 - 0.4 x10E3/uL   Basophils Absolute 0.0 0.0 - 0.2 x10E3/uL   Immature Granulocytes 0 %   Immature Grans (Abs) 0.0 0.0 - 0.1 [id number]  BASIC METABOLIC PANEL WITH GFR     Status: Abnormal   Collection Time: 06/11/16  3:39 PM  Result Value Ref Range   Sodium 138 135 - 146 mmol/L   Potassium 4.2 3.5 - 5.3 mmol/L   Chloride 105 98 - 110 mmol/L   CO2 27 20 - 31 mmol/L   Glucose, Bld 131 (H) 65 - 99 mg/dL   BUN 22 7 - 25 mg/dL   Creat 0.81 0.70 - 1.25 mg/dL    Comment:   For  patients > or = 62 years of age: The upper reference limit for Creatinine is approximately 13% higher for people identified as African-American.      Calcium 9.0 8.6 - 10.3 mg/dL   GFR, Est African American >89 >=60 mL/min   GFR, Est Non African American >89 >=60 mL/min  Hepatitis C antibody     Status: None   Collection Time: 06/11/16  3:39 PM  Result Value Ref Range   HCV Ab NEGATIVE NEGATIVE  PSA     Status: None   Collection Time: 06/11/16  3:39 PM  Result Value Ref Range   PSA 2.41 <=4.00 ng/mL    Comment: Test Methodology: ECLIA PSA (Electrochemiluminescence Immunoassay)   For PSA values from 2.5-4.0, particularly in younger men <9 years old, the AUA and NCCN suggest testing for  % Free PSA (3515) and evaluation of the rate of increase in PSA (PSA velocity).   Testosterone Total,Free,Bio, Males     Status: Abnormal   Collection Time: 06/11/16  3:39 PM  Result Value Ref Range   Testosterone 121 (L) 250 - 827 ng/dL    Comment: In hypogonadal males, Testosterone, Total,LC/MS/MS is the recommended assay due to the diminished accuracy of immunoassay at levels below 250 ng/dL.  This test code [id number]) must be collected in a red-top tube with no gel.  Two morning (8 - 10 AM) specimens obtained on different days are recommended by The Endocrine Society for screening for hypogonadism.    Albumin 4.3 3.6 - 5.1 g/dL   Sex Hormone Binding 31 22 - 77 nmol/L   Testosterone, Free 15.4 (L) 47.0 - 244.0 pg/mL    Comment:   The concentration of free testosterone is derived from a mathematical expression based on constants for the binding of testosterone to sex hormone-binding globulin and albumin.    Testosterone, Bioavailable 30.3 (L) 130.5 - 681.7 ng/dL    Comment: Due to the diminished accuracy of immunoassay at levels below 250 ng/dL, calculations of the Free and Bioavailable Testosterone are not accurate. If needed, Testosterone, Free, Bio and Total, LC/MS/MS [id number]) is the recommended assay. This specimen must be collected in a red-top tube with no gel.   The concentration of bioavailable testosterone is derived from a mathematical expression based on constants for the binding of testosterone to sex hormone-binding globulin and albumin.   Bioavailable testosterone includes free plus weakly bound (non-SHBG bound) testosterone.  Bioavailable testosterone is an assessment of the biologically active testosterone in serum.     POCT glycosylated hemoglobin (Hb A1C)     Status: None   Collection Time: 06/11/16  3:45 PM  Result Value Ref Range   Hemoglobin A1C 5.6       Constitutional:  BP 132/84 mmHg  Pulse 111  Ht 5\' 11"  (1.803 m)  Wt 206 lb 6.4 oz (93.622  kg)  BMI 28.80 kg/m2   Musculoskeletal: Strength & Muscle Tone: within normal limits Gait & Station: normal Patient leans: N/A  Psychiatric Specialty Exam: General Appearance: Casual  Eye Contact::  Fair  Speech:  Slow  Volume:  Decreased  Mood:  Anxious  Affect:  Congruent  Thought Process:  Coherent  Orientation:  Full (Time, Place, and Person)  Thought Content:  Rumination  Suicidal Thoughts:  No  Homicidal Thoughts:  No  Memory:  Immediate;   Fair Recent;   Fair Remote;   Fair  Judgement:  Fair  Insight:  Good  Psychomotor Activity:  Decreased  Concentration:  Fair  Recall:  Fair  Fund of Knowledge:  Fair  Language:  Good  Akathisia:  No  Handed:  Right  AIMS (if indicated):     Assets:  Communication Skills Desire for Improvement Financial Resources/Insurance Housing  ADL's:  Intact  Cognition:  Impaired,  Mild  Sleep:        Established Problem, Stable/Improving (1), Review of Psycho-Social Stressors (1), Review or order clinical lab tests (1), Review of Last Therapy Session (1) and Review of Medication Regimen & Side Effects (2)  Assessment: Axis I: Major depressive disorder, recurrent moderate with psychosis .  Rule out schizoaffective disorder depressed type.   Axis II: Deferred  Axis III:  Past Medical History  Diagnosis Date  . Seizures ([place])   . Hypertension   . GERD (gastroesophageal reflux disease)   . Dyslipidemia   . Schizoaffective disorder   . Depression   . Anxiety   . MRSA carrier   . Constipation   . Memory loss   . Hearing loss      Plan:  I review his blood work results, Current medication and psychosocial stressors.  Patient is a stable on his current psychiatric medication.  Encouraged to keep therapy with counseling.  I will continue Seroquel 600 mg at bedtime, gabapentin 100 mg 4 times a day, amitriptyline 150 mg at bedtime and Klonopin 1 mg 3 times a day. Discussed medication side effects specialty polypharmacy.  Discuss  safety plan that anytime having active suicidal thoughts or homicidal thought than he need to call 911 or go to the local emergency room.  Follow-up in 3 months.    Zaharah Amir T., MD 06/21/2016

## 2016-06-21 NOTE — Telephone Encounter (Signed)
Called patient again. Still no voicemail set up on mobile. Left another vm on home number asking him to call back.  Leeanne Rio, MD

## 2016-06-22 ENCOUNTER — Other Ambulatory Visit: Payer: Self-pay | Admitting: Family Medicine

## 2016-06-22 NOTE — Telephone Encounter (Signed)
Called again and was able to reach patient. Given rise in PSA, needs to see urology. I would like for them to take over managing his hypogonadism.  Note that patient does not always hear his phone ring and states he cannot figure out how to set up his voicemail. I gave him the phone number for [facility] and instructed him to call them if he has not been contacted with in 1 week, as they may have tried to call him.   He is agreeable for referral. Referral entered.  Leeanne Rio, MD

## 2016-06-26 ENCOUNTER — Encounter (HOSPITAL_COMMUNITY): Payer: Self-pay | Admitting: Clinical

## 2016-06-26 ENCOUNTER — Ambulatory Visit (INDEPENDENT_AMBULATORY_CARE_PROVIDER_SITE_OTHER): Payer: Medicaid Other | Admitting: Clinical

## 2016-06-26 DIAGNOSIS — F251 Schizoaffective disorder, depressive type: Secondary | ICD-10-CM | POA: Diagnosis not present

## 2016-06-26 NOTE — Progress Notes (Signed)
   THERAPIST PROGRESS NOTE  Session Time: 3:44 - 4:28  Participation Level: Active  Behavioral Response: CasualAlertDepressed  Type of Therapy: Individual Therapy  Treatment Goals addressed: improve psychiatric symptoms, Elevate mood, improve faulty thinking,  Interventions: CBT and Motivational Interviewing  Summary: [name] is [name] 60 y.o male who presents with Schizoaffective disorder, depressive type. .   Suicidal/Homicidal: No -without intent/plan  Therapist Response: [name] met clinician for an individual session. [name] discussed his psychiatric symptoms, his current life events. [name] shared that he is still not feeling emotions. Clinician asked open ended [name] and [name] about feeling negative but not positive emotions. However when asked about his girlfriend he was able to identify some slightly positive emotions. [name] shared he called his friends (who recently died)  Father and felt okay about the interactions. [name] and [name] shared that he has continued to exercise but finds it very difficult to clean up or take care of daily tasks. Clinician asked open ended [name] and [name] identified some changes he was willing too make. [name] shared that he would like to get [name] car and drive again. He asked for [name] note to give his fiduciary stating he was coming to treatment and the independence would help with his depression. Clinician agreed to write [name] note. [name] agreed to continue to exercise and attempt daily task       Plan: Return again in 1-2 weeks.  Diagnosis: Axis I: Schizoaffective disorder, depressive type.    [name] Lerman A, LCSW 06/26/2016

## 2016-07-10 ENCOUNTER — Ambulatory Visit (HOSPITAL_COMMUNITY): Payer: Self-pay | Admitting: Clinical

## 2016-07-24 ENCOUNTER — Ambulatory Visit (INDEPENDENT_AMBULATORY_CARE_PROVIDER_SITE_OTHER): Payer: Medicaid Other | Admitting: Clinical

## 2016-07-24 ENCOUNTER — Encounter (HOSPITAL_COMMUNITY): Payer: Self-pay | Admitting: Clinical

## 2016-07-24 DIAGNOSIS — F251 Schizoaffective disorder, depressive type: Secondary | ICD-10-CM

## 2016-07-24 NOTE — Progress Notes (Signed)
   THERAPIST PROGRESS NOTE  Session Time: 1:30 - 2:30   Participation Level: Active  Behavioral Response: CasualAlertDepressed  Type of Therapy: Individual Therapy  Treatment Goals addressed: improve psychiatric symptoms, Elevate mood,   Interventions:  Motivational Interviewing  Summary: [name] is a 60 y.o male who presents with Schizoaffective disorder, depressive type. .   Suicidal/Homicidal: No -without intent/plan  Therapist Response: Stryker met clinician for an individual session. [name] discussed his psychiatric symptoms, his current life events and his homework. He shared he has continued to exercise and try to get out a bit more (this part has been difficult). He appeared a bit more symptomatic than last session. He denied any current suicidal or homicidal ideation. He shared that he has not been feeling anything and has had a difficult time getting out of bed. He shared he is frustrated with his illness  "It is hard fighting day after day, night after night, month after month, decade after decade." Client and clinician discussed his struggle. Client and clinician completed a grounding technique together. Clinician asked open ended questions and [name] identified something that have improved such as having a friend to spend time with so he is not so lonely. Clinciian asked if [name] was still willing to make efforts and he said he was.  Plan: Return again in 1-2 weeks.  Diagnosis: Axis I: Schizoaffective disorder, depressive type.    Hagar Sadiq A, LCSW 07/24/2016

## 2016-07-27 ENCOUNTER — Other Ambulatory Visit: Payer: Self-pay | Admitting: Family Medicine

## 2016-07-31 ENCOUNTER — Other Ambulatory Visit: Payer: Self-pay | Admitting: Family Medicine

## 2016-07-31 DIAGNOSIS — E291 Testicular hypofunction: Secondary | ICD-10-CM

## 2016-08-01 NOTE — Telephone Encounter (Signed)
Patient is out of medication. Patient stated he has been to ED a couple time because he was out of his Miralax.  Derl Barrow, RN

## 2016-08-01 NOTE — Telephone Encounter (Signed)
Red team, Please call patient and ask if he has been seen at the urology office. I have not received any records from there. I need to know what they said prior to deciding about refilling his testosterone.  Thanks! Leeanne Rio, MD

## 2016-08-02 NOTE — Telephone Encounter (Signed)
Pt informed and he says he hasn't been seen by the urologist. I gave him the doctors name and number, so that he can call and make an appt. Pt says he hasn't been sen because of transportation. Shann Merrick Kennon Holter, CMA

## 2016-08-03 ENCOUNTER — Telehealth: Payer: Self-pay | Admitting: Family Medicine

## 2016-08-03 NOTE — Telephone Encounter (Signed)
Pt stated he needed a lab appt to get his testorone checked rather than a drs appt.  appt was made for lab work on Sept 5.  There are no orders showing for this appt. He also wanted Dr Ardelia Mems to know he will not see the dermatologist until Dec

## 2016-08-03 NOTE — Telephone Encounter (Signed)
2nd request.  Maki Hege L, RN  

## 2016-08-16 ENCOUNTER — Telehealth: Payer: Self-pay | Admitting: Family Medicine

## 2016-08-16 DIAGNOSIS — E291 Testicular hypofunction: Secondary | ICD-10-CM

## 2016-08-16 MED ORDER — TESTOSTERONE CYPIONATE 200 MG/ML IM SOLN: 200.0000 mg | INTRAMUSCULAR | 0 refills | Status: DC

## 2016-08-16 NOTE — Telephone Encounter (Signed)
See separate phone note. Labron Bloodgood J Belvie Iribe, MD  

## 2016-08-16 NOTE — Telephone Encounter (Signed)
Pt is wondering about his referral to the urologist.  He did not know about the one in July.  Please advise

## 2016-08-16 NOTE — Telephone Encounter (Signed)
Spoke with patient.  He apparently had appointment in July with urologist, but was never informed of the appointment and thus missed it. Has appointment in December, though this is really probably too long for [place] to wait.  Last testosterone dose was 200mg  IM on 8/22.  He had previously been taking 400mg  but self-reduced to 200mg  out of fear over his elevated PSA. Prefers to stay on the lower dose. I will send in short term refill of this while we wait to get him into urology. He is at risk for decompensation of his severe depression if we stop testosterone supplementation altogether.  Will have him come on 9/12 for lab draw, which will be ONE week after his last testosterone shot. Will draw repeat PSA as well as testosterone level.  Additionally, I will check with Tia to see if there is any way we can get him in sooner to urology. Rising PSA in setting of testosterone therapy is outside of the realm of primary care, and I'd really like to get him in there ASAP. Attempted to page urology provider on call about this earlier this week, but did not hear back.  Patient agreeable to this plan Leeanne Rio, MD

## 2016-08-16 NOTE — Telephone Encounter (Signed)
[facility] and spoke to [place]. Sharyn Lull was able to confirm that patient was advised about appt in 06/2016 and advised me that their system showed that patient confirmed his appt on 07/03/16 at 6:08 PM by phone (one day before appt). Dr. Junious Silk, who is patient is pt's urologist is completely booked until December and pt is not able to see another physician. Sharyn Lull will send a request for double book to Dr. Junious Silk and I have faxed over labs and phone documentation from Dr. Ardelia Mems to Sharyn Lull and she will attach the info to request and send it to Dr. Junious Silk. I have given her my direct line and will wait to hear from her with Dr. Lyndal Rainbow decision.

## 2016-08-16 NOTE — Telephone Encounter (Signed)
He is coming in on Tuesday sept 5.  He wants to make sure the orders are in before he comes for his appt

## 2016-08-17 ENCOUNTER — Other Ambulatory Visit: Payer: Self-pay

## 2016-08-18 IMAGING — CT CT HEAD W/O CM
2 series · 17 of 30 positions shown, 20 images · non-contrast
Comparison: 01/03/2015

CLINICAL DATA: Altered mental status, slurred speech

EXAM:
CT HEAD WITHOUT CONTRAST
TECHNIQUE: Contiguous axial images were obtained from the base of the skull
through the vertex without intravenous contrast.

[Series 2: head w/o · axial · non-contrast · 0.53mm/px · z∈[-204,-89]mm · 9 of 29 slices shown, 12 images]
[im 3/29  brain]
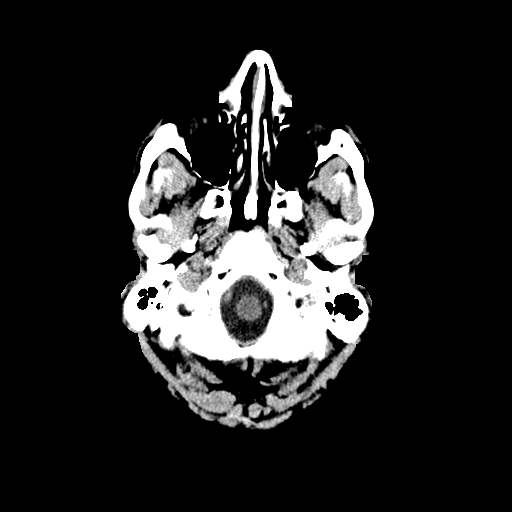
[im 3/29  bone]
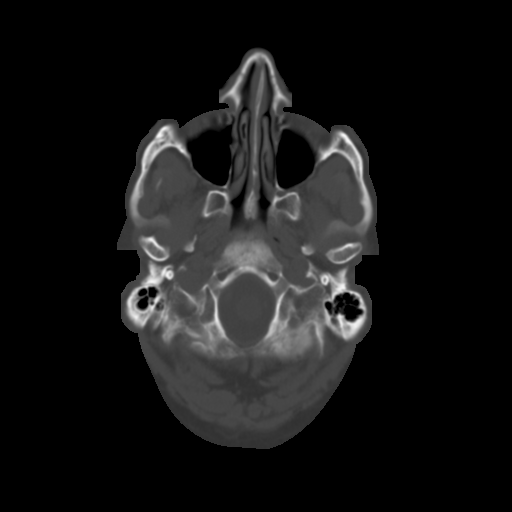
[im 6/29  brain]
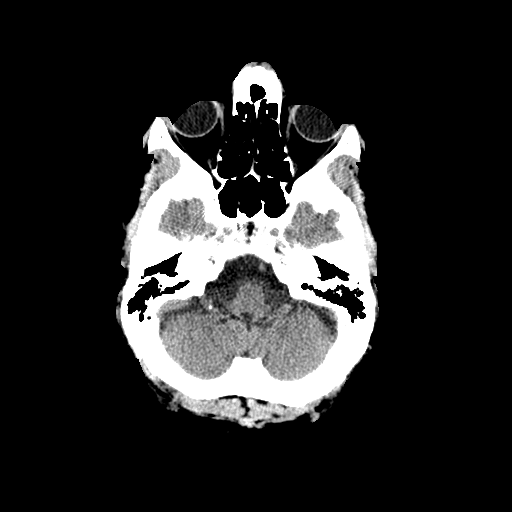
[im 9/29  brain]
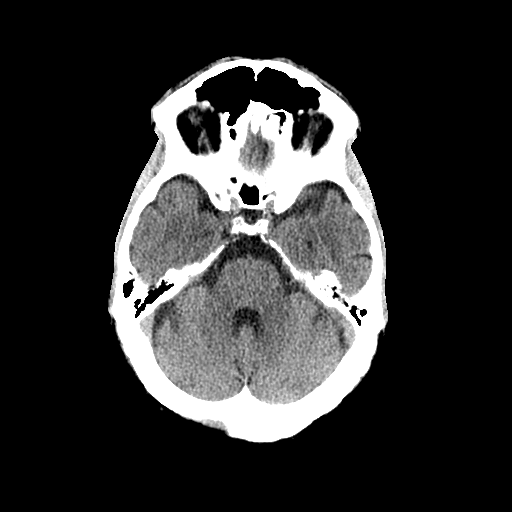
[im 12/29  brain]
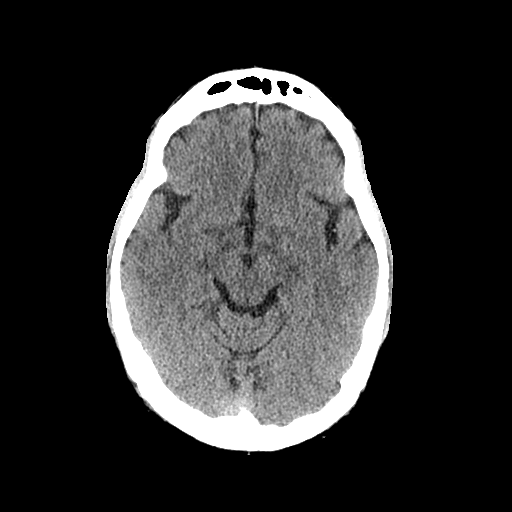
[im 15/29  brain]
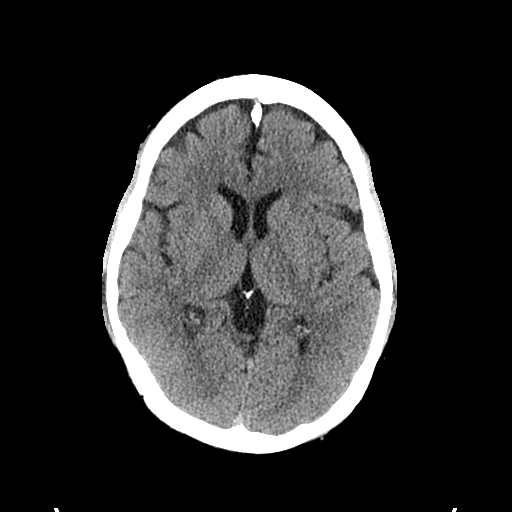
[im 15/29  bone]
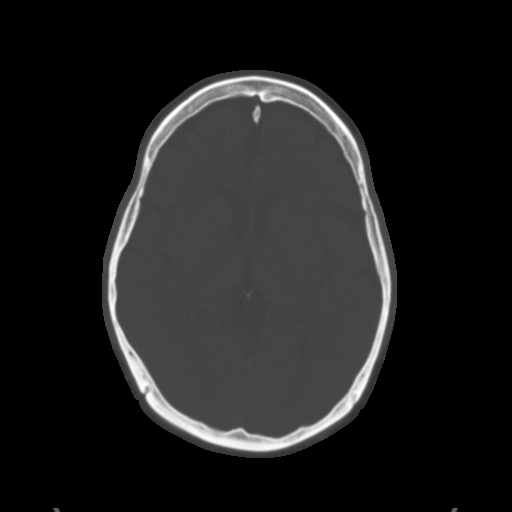
[im 17/29  brain]
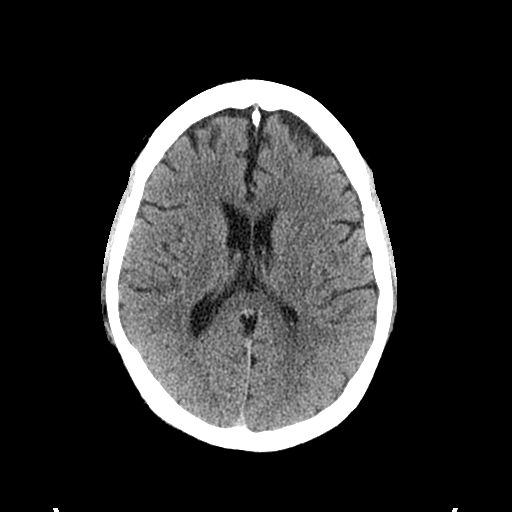
[im 20/29  brain]
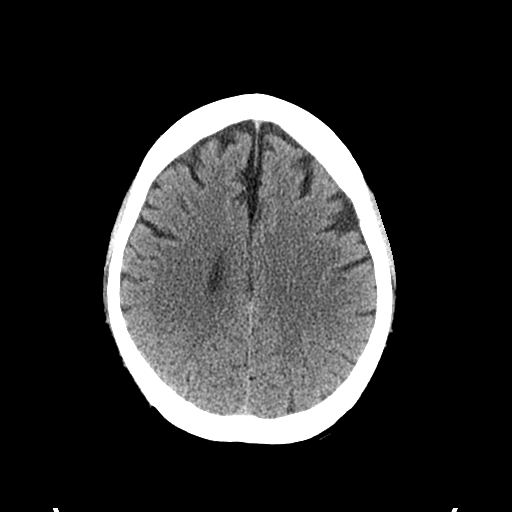
[im 23/29  brain]
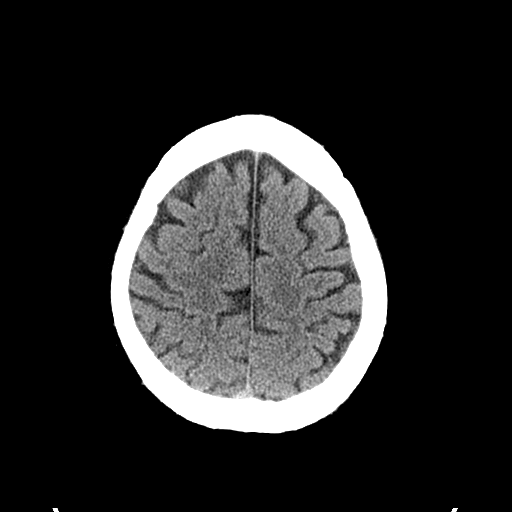
[im 26/29  brain]
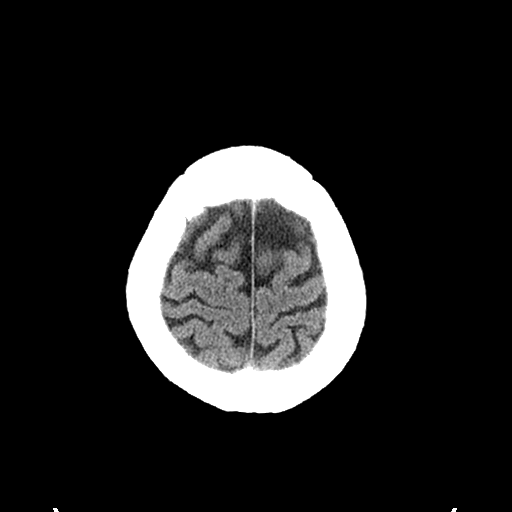
[im 26/29  bone]
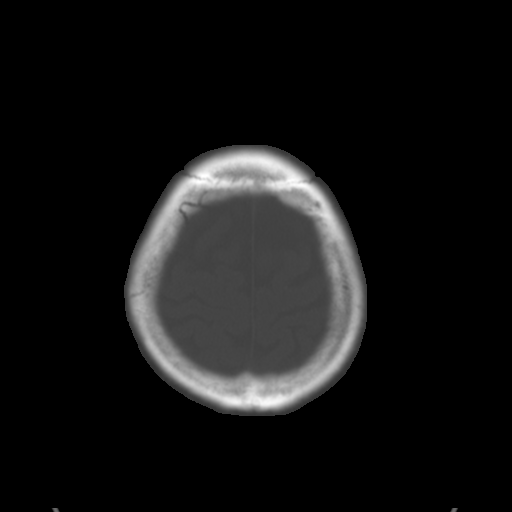

[Series 3: bone windows · axial · 0.53mm/px · z∈[-199,-91]mm · 8 of 48 slices shown]
[im 6/48  bone]
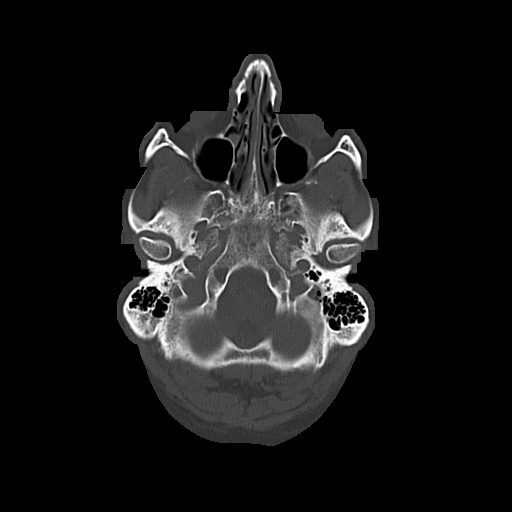
[im 11/48  bone]
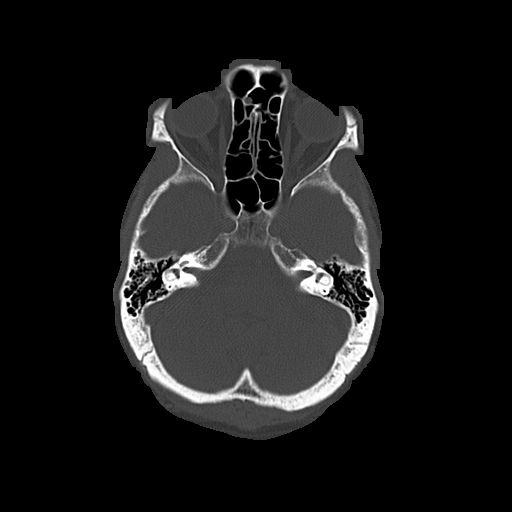
[im 16/48  bone]
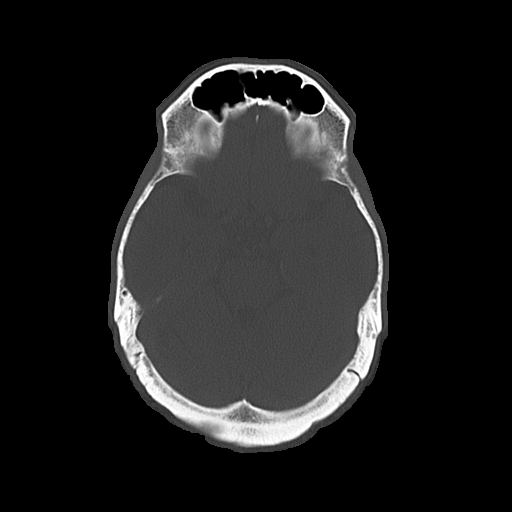
[im 21/48  bone]
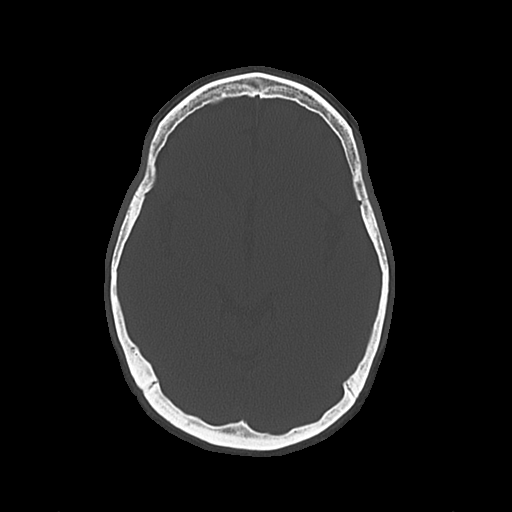
[im 27/48  bone]
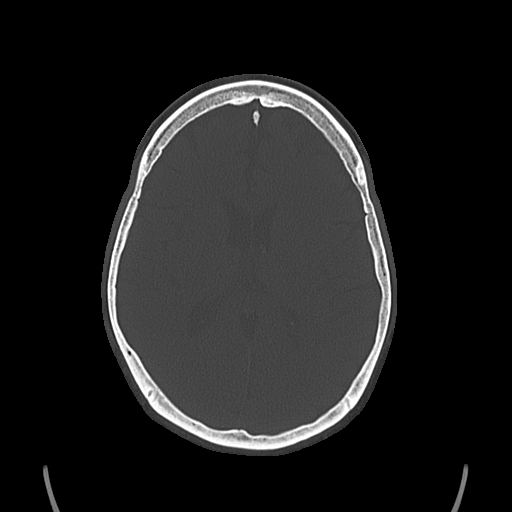
[im 32/48  bone]
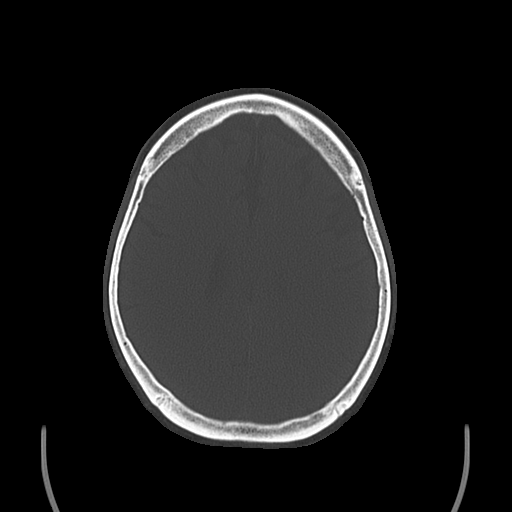
[im 37/48  bone]
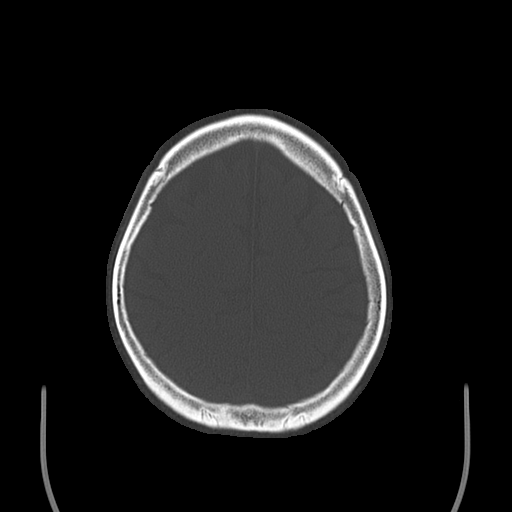
[im 42/48  bone]
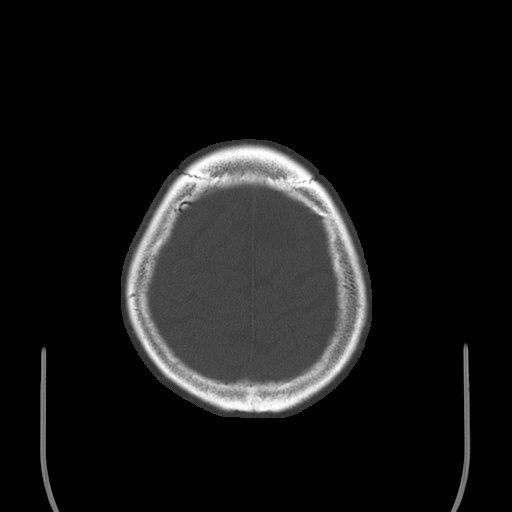

[17 of 30 positions shown; findings below may reference images not displayed]

FINDINGS: There is no evidence of mass effect, midline shift, or extra-axial
fluid collections. There is no evidence of a space-occupying lesion
or intracranial hemorrhage. There is no evidence of a cortical-based
area of acute infarction. There is mild generalized cerebral
atrophy.

The ventricles and sulci are appropriate for the patient's age. The
basal cisterns are patent.

Visualized portions of the orbits are unremarkable. The visualized
portions of the paranasal sinuses and mastoid air cells are
unremarkable.

The osseous structures are unremarkable.
IMPRESSION: No acute intracranial pathology.

## 2016-08-21 ENCOUNTER — Other Ambulatory Visit: Payer: Self-pay

## 2016-08-21 ENCOUNTER — Ambulatory Visit: Payer: Self-pay | Admitting: Family Medicine

## 2016-08-28 ENCOUNTER — Ambulatory Visit (INDEPENDENT_AMBULATORY_CARE_PROVIDER_SITE_OTHER): Payer: Medicaid Other | Admitting: Clinical

## 2016-08-28 ENCOUNTER — Other Ambulatory Visit: Payer: Medicaid Other

## 2016-08-28 DIAGNOSIS — R5381 Other malaise: Secondary | ICD-10-CM

## 2016-08-28 DIAGNOSIS — R5383 Other fatigue: Principal | ICD-10-CM

## 2016-08-28 DIAGNOSIS — E291 Testicular hypofunction: Secondary | ICD-10-CM

## 2016-08-28 DIAGNOSIS — F251 Schizoaffective disorder, depressive type: Secondary | ICD-10-CM | POA: Diagnosis not present

## 2016-08-28 NOTE — Progress Notes (Signed)
   THERAPIST PROGRESS NOTE  Session Time: 2:31 - 3:30  Participation Level: Active  Behavioral Response: CasualAlertDepressed  Type of Therapy: Individual Therapy  Treatment Goals addressed: improve psychiatric symptoms, Elevate mood, improve faulty thinking, interpersonal relationship skills  Interventions: CBT and Motivational Interviewing  Summary: [name] is a 60 y.o male who presents with Schizoaffective disorder, depressive type. .   Suicidal/Homicidal: No -without intent/plan  Therapist Response: Erickson met clinician for an individual session. [name] discussed his psychiatric symptoms and his current life events. [name] shared that he has been having some trouble with his depression. He shared that he is been concerned over layers of problems. He shared he was concerned about sharing something about himself with his girlfriend. Clinician asked open ended questions a bout his secret. This secrert it was not dangerous, immoral, harmful, or threatening to anyone in any way. [name] sometimes privately cross-dresses to relieve anxiety. Clinician asked open ended questions and [name] identified the pros and cons of sharing the information with his girlfriend. [name] decided that at this time he would not share the information as it does not impact their life together. [name] shared that he was having trouble with his trust attorney. He shared that his that attorney would not give him the pons to purchase a car even though [name] has been cleared medically to drive. [name] shared that he believes that his brother and trust attorney are working against him. [name] shared that he was sometimes exercising that it is getting harder and harder to motivate himself. [name] shared some a bout the challenges of motivating himself to do daily tasks. Clinician asked open ended questions and [name] identified some ways he may be able to improve.   Plan: Return again in 1-2 weeks.  Diagnosis: Axis I: Schizoaffective disorder,  depressive type.    Adin Laker A, LCSW 08/28/2016

## 2016-08-29 ENCOUNTER — Other Ambulatory Visit: Payer: Self-pay | Admitting: Family Medicine

## 2016-08-29 LAB — TESTOSTERONE TOTAL,FREE,BIO, MALES
Albumin: 4.4 g/dL (ref 3.6–5.1)
Sex Hormone Binding: 25 nmol/L (ref 22–77)
Testosterone, Bioavailable: 392.4 ng/dL (ref 110.0–575.0)
Testosterone, Free: 194.9 pg/mL (ref 46.0–224.0)
Testosterone: 974 ng/dL — ABNORMAL HIGH (ref 250–827)

## 2016-08-29 LAB — PSA: PSA: 0.9 ng/mL (ref <=4.0)

## 2016-08-29 NOTE — Telephone Encounter (Signed)
Pt called back about the urlogist appt.  He hasnt heard about an appt. Please advise

## 2016-08-30 ENCOUNTER — Encounter (HOSPITAL_COMMUNITY): Payer: Self-pay | Admitting: Clinical

## 2016-09-07 ENCOUNTER — Telehealth: Payer: Self-pay | Admitting: Family Medicine

## 2016-09-07 DIAGNOSIS — E291 Testicular hypofunction: Secondary | ICD-10-CM

## 2016-09-07 NOTE — Telephone Encounter (Signed)
Attempted to reach patient to discuss lab results.  Testosterone level was high - need to reduce dosing. PSA was back down. I'm unsure why it was transiently elevated.  Patient did not answer at home or cell. Left VM on both asking him to call us back. Please page me when he returns the call so I can speak with him.  Leeanne Rio, MD

## 2016-09-07 NOTE — Telephone Encounter (Signed)
Pt calling back returning your call please call him back. Paden Kuras Kennon Holter, CMA

## 2016-09-12 MED ORDER — TESTOSTERONE CYPIONATE 200 MG/ML IM SOLN: 150.0000 mg | INTRAMUSCULAR | Status: DC

## 2016-09-12 NOTE — Telephone Encounter (Signed)
Called patient & explained need to reduce testosterone dosing. He will go down to 150mg  IM every 2 weeks. Plan to recheck level in 6 weeks, with timing of blood draw one week after a dose.  Explained return of normal PSA - unclear why this was. He needs to see urology. Plans to be seen there in December.  Patient appreciative  Leeanne Rio, MD

## 2016-09-24 ENCOUNTER — Ambulatory Visit (HOSPITAL_COMMUNITY): Payer: Self-pay | Admitting: Psychiatry

## 2016-09-25 ENCOUNTER — Ambulatory Visit (INDEPENDENT_AMBULATORY_CARE_PROVIDER_SITE_OTHER): Payer: Medicaid Other | Admitting: Clinical

## 2016-09-25 ENCOUNTER — Encounter (HOSPITAL_COMMUNITY): Payer: Self-pay | Admitting: Clinical

## 2016-09-25 ENCOUNTER — Other Ambulatory Visit (HOSPITAL_COMMUNITY): Payer: Self-pay

## 2016-09-25 DIAGNOSIS — F251 Schizoaffective disorder, depressive type: Secondary | ICD-10-CM | POA: Diagnosis not present

## 2016-09-25 MED ORDER — GABAPENTIN 100 MG PO CAPS: 100.0000 mg | ORAL_CAPSULE | Freq: Four times a day (QID) | ORAL | 0 refills | Status: DC

## 2016-09-25 MED ORDER — CLONAZEPAM 1 MG PO TABS: 1.0000 mg | ORAL_TABLET | Freq: Three times a day (TID) | ORAL | 0 refills | Status: DC

## 2016-09-25 MED ORDER — QUETIAPINE FUMARATE 300 MG PO TABS: 600.0000 mg | ORAL_TABLET | Freq: Every day | ORAL | 0 refills | Status: DC

## 2016-09-25 NOTE — Progress Notes (Signed)
   THERAPIST PROGRESS NOTE  Session Time: 2:30 -3:30  Participation Level: Active  Behavioral Response: CasualAlertDepressed  Type of Therapy: Individual Therapy  Treatment Goals addressed: improve psychiatric symptoms, Elevate mood, improve faulty thinking, interpersonal relationship skills  Interventions: CBT and Motivational Interviewing  Summary: [name] is a 60 y.o male who presents with Schizoaffective disorder, depressive type. .   Suicidal/Homicidal: No -without intent/plan  Therapist Response: [name] met [name] for an individual session. [name] discussed his psychiatric symptoms, his current life events and his homework. Arnett shared that he was doing a bit the same as last session. He shared that he continues to ride his bicycle sometimes and bring up more sometimes.[name] shared that he has a precancerous growth on his head and he needs wash his hair daily with a prescription shampoo. He shared that this is making him uncomfortable because he does not shower very often do to his depression. [name] was sharing a bout his belief that he is completely numb emotionally. He presented examples of his numbness. [name] agreed that he was numb to a lot of emotions but that he was not completely numb. This is important to his [name] would like to feel emotions more. [name] asked open ended questions a bout some of [name]'s activities and [name] identified things that he does that makes himself feel better and things he avoids doing so that he doesn't feel bad. [name] and [name] discussed how we could use this information to help them challenge his uncomfortable feelings so that he can also experience more positive feelings. [name] agreed. [name] shared that his girlfriend has kidney cancer and has opted not to have chemo. [name] and [name] discussed [name]'s feelings about that. [name] shared that he does not feel much about it now but imagines he might get very depressed if she died. [name] and [name]  agreed to discuss thoughts and emotions more at next session.   Plan: Return again in 1-2 weeks.  Diagnosis: Axis I: Schizoaffective disorder, depressive type.    Lakendra Helling A, LCSW 09/25/2016

## 2016-09-27 ENCOUNTER — Ambulatory Visit (INDEPENDENT_AMBULATORY_CARE_PROVIDER_SITE_OTHER): Payer: Medicaid Other | Admitting: Adult Health

## 2016-09-27 ENCOUNTER — Other Ambulatory Visit: Payer: Self-pay | Admitting: Family Medicine

## 2016-09-27 ENCOUNTER — Other Ambulatory Visit (HOSPITAL_COMMUNITY): Payer: Self-pay | Admitting: Psychiatry

## 2016-09-27 ENCOUNTER — Encounter: Payer: Self-pay | Admitting: Adult Health

## 2016-09-27 ENCOUNTER — Other Ambulatory Visit: Payer: Self-pay | Admitting: Adult Health

## 2016-09-27 VITALS — BP 116/75 | HR 87 | Ht 71.0 in | Wt 203.8 lb

## 2016-09-27 DIAGNOSIS — Z5181 Encounter for therapeutic drug level monitoring: Secondary | ICD-10-CM | POA: Diagnosis not present

## 2016-09-27 DIAGNOSIS — R569 Unspecified convulsions: Secondary | ICD-10-CM

## 2016-09-27 DIAGNOSIS — F251 Schizoaffective disorder, depressive type: Secondary | ICD-10-CM

## 2016-09-27 NOTE — Patient Instructions (Signed)
Continue Dilantin and Vimpat Blood work today If you have any seizure events please let us know.

## 2016-09-27 NOTE — Progress Notes (Signed)
PATIENT: [name] DOB: [birth year]  REASON FOR VISIT: follow up-seizures HISTORY FROM: patient  HISTORY OF PRESENT ILLNESS: [name] is a 60 year old male with a history of intractable seizures and schizophrenia. He returns today for follow-up. He remains on Dilantin and Vimpat. He tolerated these medications well. He denies any recent seizure events. He is able to complete all ADLs independently. 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 Denies any changes with his gait or balance. He denies any new neurological symptoms. He returns today for an evaluation.  HISTORY 03/28/16:[name] is a 60 year old male with a history of schizophrenia and intractable seizures. He returns today for follow-up. The patient states that he has been tolerating Dilantin and Vimpat well. He denies any seizure events. He states that he lives home alone. He is able to complete all ADLs independently. He is currently not operating a motor vehicle. He denies any changes with his gait or balance. Denies any falls. The patient states that in the past Dilantin has caused vertigo and diplopia however he denies the symptoms now. At the last visit the patient was complaining of numbness in the hands and reported that wrist splints were beneficial. He states that he no longer has the numbness and is not wearing the splints. He denies any new neurological symptoms. He returns today for an evaluation.  HISTORY 10/25/15 [name]): [name] is a 60 year old left-handed white male with a history of schizophrenia and intractable seizures. The patient last had a seizure on 05/30/2015. The patient had some adjustments in his Dilantin dosing, but became toxic on Dilantin by August 2016. The patient will has had a reduction in the dose to a 250 mg daily dosing. The patient has done better with this, he has not had any further seizures. The dizziness problems have improved. His gait stability has gotten better. The patient has developed  some numbness in the hands bilaterally, he has started wearing wrist splints with good improvement in the symptoms already. He returns to this office for an evaluation.   REVIEW OF SYSTEMS: Out of a complete 14 system review of symptoms, the patient complains only of the following symptoms, and all other reviewed systems are negative.  Heat intolerance, constipation, insomnia, seizure    ALLERGIES: Allergies  Allergen Reactions  . Codeine Nausea And Vomiting  . Sulfa Antibiotics Nausea And Vomiting    HOME MEDICATIONS: Outpatient Medications Prior to Visit  Medication Sig Dispense Refill  . ALDARA 5 % cream Apply topically at bedtime. 24 each 1  . amitriptyline (ELAVIL) 150 MG tablet Take 1 tablet (150 mg total) by mouth at bedtime. 30 tablet 2  . aspirin EC 81 MG tablet Take 1 tablet (81 mg total) by mouth daily.    . clonazePAM (KLONOPIN) 1 MG tablet Take 1 tablet (1 mg total) by mouth 3 (three) times daily. 90 tablet 0  . [employer] SENNA PLUS 8.6-50 MG tablet TAKE 1 TABLET BY MOUTH AT BEDTIME AS NEEDED FOR MILD CONSTIPATION 30 tablet 3  . gabapentin (NEURONTIN) 100 MG capsule Take 1 capsule (100 mg total) by mouth 4 (four) times daily. 120 capsule 0  . hydrochlorothiazide (HYDRODIURIL) 12.5 MG tablet TAKE 1 TABLET (12.5 MG TOTAL) BY MOUTH DAILY. 90 tablet 3  . Lacosamide (VIMPAT) 150 MG TABS Take 1 tablet (150 mg total) by mouth 2 (two) times daily. 60 tablet 5  . metoprolol succinate (TOPROL-XL) 50 MG 24 hr tablet TAKE 1 TABLET BY MOUTH AT BEDTIME WITH OR IMMEDIATELY  FOLLOWING A MEAL 30 tablet 5  . phenytoin (DILANTIN) 100 MG ER capsule TAKE 2 CAPSULES BY MOUTH AT NIGHT 60 capsule 11  . phenytoin (DILANTIN) 50 MG tablet TAKE 1 TABLET BY MOUTH AT NIGHT 30 tablet 11  . polyethylene glycol powder (GLYCOLAX/MIRALAX) powder TAKE 17 GRAMS 2 TIMES A DAY AS NEEDED FOR CONSTIPATION. MIX INTO 8 OF FLUID AND DRINK 255 g 4  . QUEtiapine (SEROQUEL) 300 MG tablet Take 2 tablets (600 mg total) by mouth  at bedtime. 60 tablet 0  . Selenium Sulf-Pyrithione-Urea 2.25 % SHAM APPLY TO AFFECTED AREA ON SCALP 3 TO 6 TIMES PER WEEK 180 mL 0  . senna ([employer] SENNA) 8.6 MG tablet Take 1 tablet (8.6 mg total) by mouth daily as needed for constipation. 90 tablet 0  . simvastatin (ZOCOR) 40 MG tablet TAKE 1 TABLET (40 MG TOTAL) BY MOUTH AT BEDTIME. 30 tablet 5  . testosterone cypionate (DEPOTESTOSTERONE CYPIONATE) 200 MG/ML injection Inject 0.75 mLs (150 mg total) into the muscle every 14 (fourteen) days.     No facility-administered medications prior to visit.     PAST MEDICAL HISTORY: Past Medical History:  Diagnosis Date  . Anxiety   . Constipation   . Depression   . Dyslipidemia   . GERD (gastroesophageal reflux disease)   . Hearing loss   . Hypertension   . Memory loss   . MRSA carrier   . Schizoaffective disorder   . Seizures ([place])     PAST SURGICAL HISTORY: Past Surgical History:  Procedure Laterality Date  . CARPAL TUNNEL RELEASE    . COLONOSCOPY  2010  . ORIF SHOULDER FRACTURE    . self orchectomy    . SHOULDER CLOSED REDUCTION Left 09/16/2013   Procedure: CLOSED REDUCTION SHOULDER;  Surgeon: Mauri Pole, MD;  Location: [facility];  Service: Orthopedics;  Laterality: Left;  . SHOULDER HEMI-ARTHROPLASTY Left 09/18/2013   Procedure: LEFT SHOULDER HEMI-ARTHROPLASTY;  Surgeon: Augustin Schooling, MD;  Location: [facility];  Service: Orthopedics;  Laterality: Left;    FAMILY HISTORY: Family History  Problem Relation Age of Onset  . Stroke Father     Living at 16  . Stroke Mother     Stroke in late 69's. Died in her 39's  . Alcohol abuse Brother   . Alcohol abuse Brother     SOCIAL HISTORY: Social History   Social History  . Marital status: Single    Spouse name: N/A  . Number of children: 0  . Years of education: 82   Occupational History  . disabled    Social History Main Topics  . Smoking status: Never Smoker  . Smokeless tobacco: Never Used  . Alcohol use No  . Drug use:  No  . Sexual activity: No   Other Topics Concern  . Not on file   Social History Narrative   Patient does not drink caffeine.   Patient is left handed.      PHYSICAL EXAM  Vitals:   09/27/16 1423  BP: 116/75  Pulse: 87  Weight: 203 lb 12.8 oz (92.4 kg)  Height: 5\' 11"  (1.803 m)   Body mass index is 28.42 kg/m.  Generalized: Well developed, in no acute distress   Neurological examination  Mentation: Alert oriented to time, place, history taking. Follows all commands speech and language fluent Cranial nerve II-XII: Pupils were equal round reactive to light. Extraocular movements were full, visual field were full on confrontational test. Facial sensation and strength were normal.  Uvula tongue midline. Head turning and shoulder shrug  were normal and symmetric. Motor: The motor testing reveals 5 over 5 strength of all 4 extremities. Good symmetric motor tone is noted throughout.  Sensory: Sensory testing is intact to soft touch on all 4 extremities. No evidence of extinction is noted.  Coordination: Cerebellar testing reveals good finger-nose-finger and heel-to-shin bilaterally.  Gait and station: Gait is normal. Tandem gait is unsteady. Romberg is negative. No drift is seen.  Reflexes: Deep tendon reflexes are symmetric and normal bilaterally.   DIAGNOSTIC DATA (LABS, IMAGING, TESTING) - I reviewed patient records, labs, notes, testing and imaging myself where available.  Lab Results  Component Value Date   WBC 12.7 (H) 03/28/2016   HGB 14.4 01/17/2016   HCT 48.4 03/28/2016   MCV 95 03/28/2016   PLT 230 03/28/2016      Component Value Date/Time   NA 138 06/11/2016 1539   NA 135 03/28/2016 1506   K 4.2 06/11/2016 1539   CL 105 06/11/2016 1539   CO2 27 06/11/2016 1539   GLUCOSE 131 (H) 06/11/2016 1539   BUN 22 06/11/2016 1539   BUN 17 03/28/2016 1506   CREATININE 0.81 06/11/2016 1539   CALCIUM 9.0 06/11/2016 1539   PROT 7.2 03/28/2016 1506   ALBUMIN 4.4  08/28/2016 1613   ALBUMIN 4.4 03/28/2016 1506   AST 20 03/28/2016 1506   ALT 21 03/28/2016 1506   ALKPHOS 97 03/28/2016 1506   BILITOT 0.3 03/28/2016 1506   GFRNONAA >89 06/11/2016 1539   GFRAA >89 06/11/2016 1539   Lab Results  Component Value Date   CHOL 125 07/30/2015   HDL 42 07/30/2015   LDLCALC 62 07/30/2015   TRIG 107 07/30/2015   CHOLHDL 3.0 07/30/2015   Lab Results  Component Value Date   HGBA1C 5.6 06/11/2016   Lab Results  Component Value Date   [id number] 02/16/2015   Lab Results  Component Value Date   TSH 2.644 07/13/2015      ASSESSMENT AND PLAN 60 y.o. year old male  has a past medical history of Anxiety; Constipation; Depression; Dyslipidemia; GERD (gastroesophageal reflux disease); Hearing loss; Hypertension; Memory loss; MRSA carrier; Schizoaffective disorder; and Seizures ([place]). here with:  1. Seizures  Overall the patient is doing well. He will continue on Dilantin and Vimpat. I will check blood work today. Advised that if he has any seizure events he should let us know. Follow-up in one year or sooner if needed.    Ward Givens, MSN, NP-C 09/27/2016, 11:59 AM [facility] [address] [place], [place] [address] [phone]

## 2016-09-27 NOTE — Progress Notes (Signed)
I have read the note, and I agree with the clinical assessment and plan.  Cranston Koors KEITH   

## 2016-09-28 LAB — CBC WITH DIFFERENTIAL/PLATELET
Basophils Absolute: 0 10*3/uL (ref 0.0–0.2)
Basos: 1 %
EOS (ABSOLUTE): 0.1 10*3/uL (ref 0.0–0.4)
Eos: 2 %
Hematocrit: 45.8 % (ref 37.5–51.0)
Hemoglobin: 16.2 g/dL (ref 12.6–17.7)
Immature Grans (Abs): 0 10*3/uL (ref 0.0–0.1)
Immature Granulocytes: 0 %
Lymphocytes Absolute: 1.3 10*3/uL (ref 0.7–3.1)
Lymphs: 25 %
MCH: 31.6 pg (ref 26.6–33.0)
MCHC: 35.4 g/dL (ref 31.5–35.7)
MCV: 90 fL (ref 79–97)
Monocytes Absolute: 0.4 10*3/uL (ref 0.1–0.9)
Monocytes: 8 %
Neutrophils Absolute: 3.5 10*3/uL (ref 1.4–7.0)
Neutrophils: 64 %
Platelets: 205 10*3/uL (ref 150–379)
RBC: 5.12 x10E6/uL (ref 4.14–5.80)
RDW: 13.6 % (ref 12.3–15.4)
WBC: 5.4 10*3/uL (ref 3.4–10.8)

## 2016-09-28 LAB — COMPREHENSIVE METABOLIC PANEL
ALT: 27 IU/L (ref 0–44)
AST: 23 IU/L (ref 0–40)
Albumin/Globulin Ratio: 1.8 (ref 1.2–2.2)
Albumin: 4.6 g/dL (ref 3.6–4.8)
Alkaline Phosphatase: 105 IU/L (ref 39–117)
BUN/Creatinine Ratio: 18 (ref 10–24)
BUN: 19 mg/dL (ref 8–27)
Bilirubin Total: 0.2 mg/dL (ref 0.0–1.2)
CO2: 29 mmol/L (ref 18–29)
Calcium: 9.8 mg/dL (ref 8.6–10.2)
Chloride: 97 mmol/L (ref 96–106)
Creatinine, Ser: 1.03 mg/dL (ref 0.76–1.27)
GFR calc Af Amer: 90 mL/min/{1.73_m2} (ref >59)
GFR calc non Af Amer: 77 mL/min/{1.73_m2} (ref >59)
Globulin, Total: 2.6 g/dL (ref 1.5–4.5)
Glucose: 143 mg/dL — ABNORMAL HIGH (ref 65–99)
Potassium: 4 mmol/L (ref 3.5–5.2)
Sodium: 140 mmol/L (ref 134–144)
Total Protein: 7.2 g/dL (ref 6.0–8.5)

## 2016-09-28 LAB — PHENYTOIN LEVEL, TOTAL: Phenytoin (Dilantin), Serum: 12.6 ug/mL (ref 10.0–20.0)

## 2016-10-01 ENCOUNTER — Telehealth: Payer: Self-pay | Admitting: Adult Health

## 2016-10-01 ENCOUNTER — Telehealth: Payer: Self-pay | Admitting: *Deleted

## 2016-10-01 NOTE — Telephone Encounter (Signed)
Faxed printed/signed rx by CW, MD to patient pharmacy. [phone]. Received confirmation.

## 2016-10-01 NOTE — Telephone Encounter (Signed)
Pt called in stating his pharmacy does not have rx for VIMPAT 150 MG TABS. Can this be resent?

## 2016-10-01 NOTE — Telephone Encounter (Signed)
LMVM for pt that labwork results (home) unremarkable.   He is to call back if questions.

## 2016-10-01 NOTE — Telephone Encounter (Signed)
-----   Message from Ward Givens, NP sent at 10/01/2016  8:24 AM EDT ----- Lab work unremarkable. Please call patient.

## 2016-10-19 ENCOUNTER — Ambulatory Visit (HOSPITAL_COMMUNITY): Payer: Self-pay | Admitting: Psychiatry

## 2016-10-23 NOTE — Progress Notes (Signed)
[place] [phone] Progress Note  [name] [id number] 60 y.o.  10/24/2016 5:15 PM  Chief Complaint:  Medication management and follow-up.        History of Present Illness:  [name] came for his follow-up appointment. He states that he is not feeling good since the last appointment. He states that he listened to [facility] which he has not done for ten years. He then feels that "soul is going to explode and destroy the world." He ruminates on  mantra and he is worried that he might have panic attack. He believes that it has been getting worse over the past two weeks. He endorses insomnia. He feels that somebody might be breaking into his home, although he states he has had these thoughts over the year. He talks about discordance with his brother and he may do anything, although he does not elaborate it further, stating that the main problem is a Ecologist. He feels down. He denies SI. He has a history of suicide attempt by buying a gun in 2012. He denies AH/VH. Patient denies drinking or using any illegal substances.  He is seeing Tharon Aquas for counseling, although he does not meet her often as he is concerned about transportation.   Suicidal Ideation: No Plan Formed: No Patient has means to carry out plan: No  Homicidal Ideation: No Plan Formed: No Patient has means to carry out plan: No  Past Psychiatric History/Hospitalization(s): Patient has severe depression since his college.  He's been taking medication since 1984.  He has at least 15 psychiatric hospitalization due to severe depression, paranoia and suicidal thoughts.  His [facility] psychiatric [facility] at [facility] where he was admitted from July 27 to 07/21/2015.  He was thinking to asphyxiate on helium.  At that time he was taking Seroquel 900 mg at bedtime, amitriptyline 400 mg at bedtime and Klonopin 2 mg 4 times a day.  He had one history of suicidal attempt when he cut his testicle because he does not want to be  depressed.  He admitted history of paranoia and negative thinking.  He was seeing at [name] at [place] however it closed he moved to [name] to continue with [name].  He had tried numerous SSRIs however he always had opposite reaction with the SSRI.  He felt poor sleep, restlessness, irritability and unable to tolerate SSRIs.  In the past she had tried nortriptyline, Haldol, Geodon, Risperdal, lithium, Prozac, Wellbutrin , Paxil, Ativan, Lexapro and Zoloft.  He denies any history of mania.  Patient denies any history of drugs or alcohol use.  Anxiety: Yes Bipolar Disorder: No Depression: Yes Mania: No Psychosis: Paranoia and negative thinking Schizophrenia: No Personality Disorder: No Hospitalization for psychiatric illness: No History of Electroconvulsive Shock Therapy: No Prior Suicide Attempts: Yes  Medical History; Patient has hypertension, GERD, hyperlipidemia, mild memory impairment, seizure disorder.  His primary care physician is [facility]. Patient also goes to [facility] neurology for seizures management.  Past Medical History:  Diagnosis Date  . Anxiety   . Constipation   . Depression   . Dyslipidemia   . GERD (gastroesophageal reflux disease)   . Hearing loss   . Hypertension   . Memory loss   . MRSA carrier   . Schizoaffective disorder   . Seizures ([place])     Family History; Patient reported his uncle had severe depression and committed suicide by killing himself  Education and Work History; Patient was in the  college when he had severe depression and he has to leave the college.  He is on disability.  Psychosocial History; Patient born and raised in [place].  He never married.  He has no children.  He has a good friend who he know for 15 years and very supportive.  His parents are deceased.  His father died 3 years ago.  He has one brother who live in [place].  Review of Systems  Constitutional: Negative.    Cardiovascular: Negative for chest pain and palpitations.  Musculoskeletal: Negative.   Skin: Negative for itching and rash.  Neurological: Negative for dizziness, tremors and headaches.  Psychiatric/Behavioral: Positive for depression. Negative for hallucinations, memory loss, substance abuse and suicidal ideas. The patient is nervous/anxious and has insomnia.     Psychiatric: Agitation: No Hallucination: No Depressed Mood: Yes Insomnia: No Hypersomnia: No Altered Concentration: No Feels Worthless: No Grandiose Ideas: No Belief In Special Powers: No New/Increased Substance Abuse: No Compulsions: No  Neurologic: Headache: No Seizure: History of seizures Paresthesias: Yes   Outpatient Encounter Prescriptions as of 10/24/2016  Medication Sig  . ALDARA 5 % cream Apply topically at bedtime.  Marland Kitchen amitriptyline (ELAVIL) 100 MG tablet Take 2 tablets (200 mg total) by mouth at bedtime.  Marland Kitchen aspirin EC 81 MG tablet Take 1 tablet (81 mg total) by mouth daily.  . clonazePAM (KLONOPIN) 1 MG tablet Take 1 tablet (1 mg total) by mouth 3 (three) times daily.  . [employer] SENNA PLUS 8.6-50 MG tablet TAKE 1 TABLET BY MOUTH AT BEDTIME AS NEEDED FOR MILD CONSTIPATION  . gabapentin (NEURONTIN) 100 MG capsule Take 1 capsule (100 mg total) by mouth 4 (four) times daily.  . hydrochlorothiazide (HYDRODIURIL) 12.5 MG tablet TAKE 1 TABLET (12.5 MG TOTAL) BY MOUTH DAILY.  . metoprolol succinate (TOPROL-XL) 50 MG 24 hr tablet TAKE 1 TABLET BY MOUTH AT BEDTIME WITH OR IMMEDIATELY FOLLOWING A MEAL  . phenytoin (DILANTIN) 100 MG ER capsule TAKE 2 CAPSULES BY MOUTH AT NIGHT  . phenytoin (DILANTIN) 50 MG tablet TAKE 1 TABLET BY MOUTH AT NIGHT  . polyethylene glycol powder (GLYCOLAX/MIRALAX) powder TAKE 17 GRAMS 2 TIMES A DAY AS NEEDED FOR CONSTIPATION. MIX INTO 8 OF FLUID AND DRINK  . QUEtiapine (SEROQUEL) 300 MG tablet Take 2 tablets (600 mg total) by mouth at bedtime.  . Selenium Sulf-Pyrithione-Urea 2.25 % SHAM  APPLY TO AFFECTED AREA ON SCALP 3 TO 6 TIMES PER WEEK  . senna ([employer] SENNA) 8.6 MG tablet Take 1 tablet (8.6 mg total) by mouth daily as needed for constipation.  . simvastatin (ZOCOR) 40 MG tablet TAKE 1 TABLET (40 MG TOTAL) BY MOUTH AT BEDTIME.  Marland Kitchen testosterone cypionate (DEPOTESTOSTERONE CYPIONATE) 200 MG/ML injection Inject 0.75 mLs (150 mg total) into the muscle every 14 (fourteen) days.  Marland Kitchen VIMPAT 150 MG TABS TAKE 1 TABLET BY MOUTH TWICE A DAY  . [DISCONTINUED] amitriptyline (ELAVIL) 150 MG tablet TAKE 1 TABLET (150 MG TOTAL) BY MOUTH AT BEDTIME.  . [DISCONTINUED] clonazePAM (KLONOPIN) 1 MG tablet Take 1 tablet (1 mg total) by mouth 3 (three) times daily.  . [DISCONTINUED] gabapentin (NEURONTIN) 100 MG capsule Take 1 capsule (100 mg total) by mouth 4 (four) times daily.  . [DISCONTINUED] QUEtiapine (SEROQUEL) 300 MG tablet Take 2 tablets (600 mg total) by mouth at bedtime.   No facility-administered encounter medications on file as of 10/24/2016.     Recent Results (from the past 2160 hour(s))  Testosterone Total,Free,Bio, Males     Status:  Abnormal   Collection Time: 08/28/16  4:13 PM  Result Value Ref Range   Testosterone 974 (H) 250 - 827 ng/dL   Albumin 4.4 3.6 - 5.1 g/dL   Sex Hormone Binding 25 22 - 77 nmol/L   Testosterone, Free 194.9 46.0 - 224.0 pg/mL    Comment: ** Please note change in reference range(s). **      Testosterone, Bioavailable 392.4 110.0 - 575.0 ng/dL    Comment: ** Please note change in reference range(s). **     PSA     Status: None   Collection Time: 08/28/16  4:13 PM  Result Value Ref Range   PSA 0.9 <=4.0 ng/mL    Comment:   The total PSA value from this assay system is standardized against the WHO standard. The test result will be approximately 20% lower when compared to the equimolar-standardized total PSA (Beckman Coulter). Comparison of serial PSA results should be interpreted with this fact in mind.   This test was performed using the  Siemens chemiluminescent method. Values obtained from different assay methods cannot be used interchangeably. PSA levels, regardless of value, should not be interpreted as absolute evidence of the presence or absence of disease.   Effective July 23, 2016, Total PSA is being tested on the Siemens Centaur XP using chemiluminescence methodology. Re-baseline testing will be available until October 22, 2016 at no charge. If you have a patient that may require re-baselining, please order [id number] in addition to [id number].   Comprehensive metabolic panel     Status: Abnormal   Collection Time: 09/27/16  3:16 PM  Result Value Ref Range   Glucose 143 (H) 65 - 99 mg/dL   BUN 19 8 - 27 mg/dL   Creatinine, Ser 1.03 0.76 - 1.27 mg/dL   GFR calc non Af Amer 77 >59 mL/min/1.73   GFR calc Af Amer 90 >59 mL/min/1.73   BUN/Creatinine Ratio 18 10 - 24   Sodium 140 134 - 144 mmol/L   Potassium 4.0 3.5 - 5.2 mmol/L   Chloride 97 96 - 106 mmol/L   CO2 29 18 - 29 mmol/L   Calcium 9.8 8.6 - 10.2 mg/dL   Total Protein 7.2 6.0 - 8.5 g/dL   Albumin 4.6 3.6 - 4.8 g/dL   Globulin, Total 2.6 1.5 - 4.5 g/dL   Albumin/Globulin Ratio 1.8 1.2 - 2.2   Bilirubin Total 0.2 0.0 - 1.2 mg/dL   Alkaline Phosphatase 105 39 - 117 IU/L   AST 23 0 - 40 IU/L   ALT 27 0 - 44 IU/L  CBC with Differential/Platelet     Status: None   Collection Time: 09/27/16  3:16 PM  Result Value Ref Range   WBC 5.4 3.4 - 10.8 x10E3/uL   RBC 5.12 4.14 - 5.80 x10E6/uL   Hemoglobin 16.2 12.6 - 17.7 g/dL   Hematocrit 45.8 37.5 - 51.0 %   MCV 90 79 - 97 fL   MCH 31.6 26.6 - 33.0 pg   MCHC 35.4 31.5 - 35.7 g/dL   RDW 13.6 12.3 - 15.4 %   Platelets 205 150 - 379 x10E3/uL   Neutrophils 64 Not Estab. %   Lymphs 25 Not Estab. %   Monocytes 8 Not Estab. %   Eos 2 Not Estab. %   Basos 1 Not Estab. %   Neutrophils Absolute 3.5 1.4 - 7.0 x10E3/uL   Lymphocytes Absolute 1.3 0.7 - 3.1 x10E3/uL   Monocytes Absolute 0.4 0.1 - 0.9 x10E3/uL   EOS  (  ABSOLUTE) 0.1 0.0 - 0.4 x10E3/uL   Basophils Absolute 0.0 0.0 - 0.2 x10E3/uL   Immature Granulocytes 0 Not Estab. %   Immature Grans (Abs) 0.0 0.0 - 0.1 x10E3/uL  Phenytoin level, total     Status: None   Collection Time: 09/27/16  3:16 PM  Result Value Ref Range   Phenytoin (Dilantin), Serum 12.6 10.0 - 20.0 ug/mL    Comment:                                 Detection Limit =  0.8                           <0.8 Indicates None Detected       Constitutional:  BP 126/70   Pulse 87   Ht 5\' 11"  (1.803 m)   Wt 206 lb 3.2 oz (93.5 kg)   BMI 28.76 kg/m    Musculoskeletal: Strength & Muscle Tone: within normal limits Gait & Station: normal Patient leans: N/A  Psychiatric Specialty Exam: General Appearance: Casual  Eye Contact::  Fair  Speech:  Slow  Volume:  Decreased  Mood:  Anxious  Affect:  anxious  Thought Process:  Coherent  Orientation:  Full (Time, Place, and Person)  Thought Content:  Rumination Perceptions: denies AH/VH  Suicidal Thoughts:  No  Homicidal Thoughts:  No  Memory:  Immediate;   Fair Recent;   Fair Remote;   Fair  Judgement:  Fair  Insight:  Good  Psychomotor Activity:  Decreased  Concentration:  Fair  Recall:  [employer] of Knowledge:  Fair  Language:  Good  Akathisia:  No  Handed:  Right  AIMS (if indicated):     Assets:  Communication Skills Desire for Improvement Financial Resources/Insurance Housing  ADL's:  Intact  Cognition:  Impaired,  Mild  Sleep:      Established Problem, Stable/Improving (1), Review of Psycho-Social Stressors (1), Review or order clinical lab tests (1), Review of Last Therapy Session (1) and Review of Medication Regimen & Side Effects (2)  Assessment: AUSTUN DEBAERE is a 60 year old male with depression with psychosis, anxiety, r/o schizoaffective disorder, seizure disorder, hypertension, GERD, who presents for follow up appointment. He is a patient of Dr. Adele Schilder.   # Unspecified anxiety disorder # MDD with  psychosis Today's exam is notable for his rumination on mantra, anticipatory anxiety. His thought process is otherwise linear on today's examination. Will increase amitriptyline to target his mood. Discussed anticholinergic side effects of dry mouth, constipation and cardiac risk. Patient reports his preference to continue other medication. Will have earlier follow up given his active symptoms.   Plan:  1. Increase amitriptyline 200 mg at night (EKG QTc 458 msec on 07/2015; consider recheck at the next visit) 2. Continue Seroquel 600 mg at bedtime 3. Continue gabapentin 100 mg 4 times a day 4. Continue Klonopin 1 mg 3 times a day 5. Return to clinic in one month  The patient demonstrates the following risk factors for suicide: Chronic risk factors for suicide include: psychiatric disorder of depression, anxiety, previous suicide attempts of buying a gun and medical illness of seizure. Acute risk factors for suicide include: N/A. Protective factors for this patient include: positive social support, coping skills and hope for the future. Considering these factors, the overall suicide risk at this point appears to be low. Patient is appropriate for  outpatient follow up. Discuss safety plan that anytime having active suicidal thoughts or homicidal thought than he need to call 911 or go to the local emergency room.    Norman Clay, MD 10/24/2016

## 2016-10-24 ENCOUNTER — Ambulatory Visit (INDEPENDENT_AMBULATORY_CARE_PROVIDER_SITE_OTHER): Payer: Medicaid Other | Admitting: Psychiatry

## 2016-10-24 ENCOUNTER — Encounter (HOSPITAL_COMMUNITY): Payer: Self-pay | Admitting: Psychiatry

## 2016-10-24 DIAGNOSIS — Z79899 Other long term (current) drug therapy: Secondary | ICD-10-CM | POA: Diagnosis not present

## 2016-10-24 DIAGNOSIS — F251 Schizoaffective disorder, depressive type: Secondary | ICD-10-CM | POA: Diagnosis not present

## 2016-10-24 DIAGNOSIS — Z7982 Long term (current) use of aspirin: Secondary | ICD-10-CM | POA: Diagnosis not present

## 2016-10-24 DIAGNOSIS — Z818 Family history of other mental and behavioral disorders: Secondary | ICD-10-CM

## 2016-10-24 MED ORDER — GABAPENTIN 100 MG PO CAPS: 100.0000 mg | ORAL_CAPSULE | Freq: Four times a day (QID) | ORAL | 0 refills | Status: DC

## 2016-10-24 MED ORDER — AMITRIPTYLINE HCL 100 MG PO TABS: 200.0000 mg | ORAL_TABLET | Freq: Every day | ORAL | 0 refills | Status: DC

## 2016-10-24 MED ORDER — CLONAZEPAM 1 MG PO TABS: 1.0000 mg | ORAL_TABLET | Freq: Three times a day (TID) | ORAL | 0 refills | Status: DC

## 2016-10-24 MED ORDER — QUETIAPINE FUMARATE 300 MG PO TABS: 600.0000 mg | ORAL_TABLET | Freq: Every day | ORAL | 0 refills | Status: DC

## 2016-10-24 NOTE — Patient Instructions (Addendum)
1. Increase amitriptyline 200 mg at night 2. Continue Seroquel 600 mg at bedtime 3. Continue gabapentin 100 mg 4 times a day 4. Continue Klonopin 1 mg 3 times a day 5. Return to clinic in one month

## 2016-10-30 ENCOUNTER — Ambulatory Visit (INDEPENDENT_AMBULATORY_CARE_PROVIDER_SITE_OTHER): Payer: Medicaid Other | Admitting: Clinical

## 2016-10-30 DIAGNOSIS — F251 Schizoaffective disorder, depressive type: Secondary | ICD-10-CM | POA: Diagnosis not present

## 2016-10-30 NOTE — Progress Notes (Signed)
   THERAPIST PROGRESS NOTE  Session Time:2:30 - 3:25  Participation Level: Active  Behavioral Response: CasualAlertAnxious and Depressed  Type of Therapy: Individual Therapy  Treatment Goals addressed: improve psychiatric symptoms,improve faulty thinking,healthy coping skills  Interventions: CBT and Motivational Interviewing  Summary: [name] is [name] 60 y.o male who presents with Schizoaffective disorder, depressive type. .   Suicidal/Homicidal: No -without intent/plan  Therapist Response: [name] met clinician for an individual session. [name] discussed his psychiatric symptoms and  his current life events. Client and clinician reviewed and update his assessment. [name] shared that he has been anxious because he has listened to some mantras and he is worried they are going to over take him. He shared about his last spiritual break though and how it has left him with the symptoms he has today. Clinician asked open ended questions about his symptoms and his what might improve his symptoms. [name] shared that he would stop listening to the mantras if clinician thought it was best. Clinician suggested that [name] stop listening to the mantras since he links them to contributing to his symptoms. Client and clinician discussed  Healthy coping skills such as taking his meds as prescribed, grounding, exercise and doing daily tasks. [name] shared he was taking his meds as prescribed. Clinician suggest he share about the change in symptoms with his doctor which he agreed to do.[name] denied any suicidal or homicidal ideation. Client and clinician discussed [name] options should his symptoms increased. [name] was calmer at the end of the session.  Plan: Return again in 1-2 weeks.  Diagnosis: Axis I: Schizoaffective disorder, depressive type.    [name] Pacifico A, LCSW 10/30/2016

## 2016-11-06 ENCOUNTER — Encounter (HOSPITAL_COMMUNITY): Payer: Self-pay | Admitting: Clinical
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
## 2016-11-19 NOTE — Progress Notes (Signed)
[facility] [phone] Progress Note  MCLANE THEODOROU [id number] 60 y.o.  11/19/2016 12:52 PM  Chief Complaint:  Medication management and follow-up.        History of Present Illness:  [name] came for his follow-up appointment. He states that he is doing better since the last appointment. When he is asked about "mantra," he reports that it is powerful and he tries to keep it out, as it scared him. When he is asked to elaborate it, he talks about [place] where they have "equivalents to DSM-5." He endorses emotional numbness and wonder why he has it. He talks about the fear that his brother might be break into his home, and it is "based on reality." He sleeps 6 hours and feels rested. He denies SI, AH/VH. He denies alcohol use or drug use. He complains dry mouth and constipation; which is chronic and he prefers to be on current medication.    Suicidal Ideation: No Plan Formed: No Patient has means to carry out plan: No  Homicidal Ideation: No Plan Formed: No Patient has means to carry out plan: No  Past Psychiatric History/Hospitalization(s): Patient has severe depression since his college.  He's been taking medication since 1984.  He has at least 15 psychiatric hospitalization due to severe depression, paranoia and suicidal thoughts.  His last psychiatric hospitalization was at behavioral Center where he was admitted from July 27 to 07/21/2015.  He was thinking to asphyxiate on helium.  At that time he was taking Seroquel 900 mg at bedtime, amitriptyline 400 mg at bedtime and Klonopin 2 mg 4 times a day.  He had one history of suicidal attempt when he cut his testicle because he does not want to be depressed.  He admitted history of paranoia and negative thinking.  He was seeing at [name] at [facility] however it closed he moved to [name] to continue with [name].  He had tried numerous SSRIs however he always had opposite reaction with the SSRI.  He felt poor  sleep, restlessness, irritability and unable to tolerate SSRIs.  In the past she had tried nortriptyline, Haldol, Geodon, Risperdal, lithium, Prozac, Wellbutrin , Paxil, Ativan, Lexapro and Zoloft.  He denies any history of mania.  Patient denies any history of drugs or alcohol use.  Anxiety: Yes Bipolar Disorder: No Depression: Yes Mania: No Psychosis: Paranoia and negative thinking Schizophrenia: No Personality Disorder: No Hospitalization for psychiatric illness: No History of Electroconvulsive Shock Therapy: No Prior Suicide Attempts: Yes  Medical History; Patient has hypertension, GERD, hyperlipidemia, mild memory impairment, seizure disorder.  His primary care physician is [facility]. Patient also goes to [facility] neurology for seizures management.  Past Medical History:  Diagnosis Date  . Anxiety   . Constipation   . Depression   . Dyslipidemia   . GERD (gastroesophageal reflux disease)   . Hearing loss   . Hypertension   . Memory loss   . MRSA carrier   . Schizoaffective disorder   . Seizures ([place])     Family History; Patient reported his uncle had severe depression and committed suicide by killing himself  Education and Work History; Patient was in the college when he had severe depression and he has to leave the college.  He is on disability.  Psychosocial History; Patient born and raised in [place].  He never married.  He has no children.  He has a good friend who he know for 15 years and very supportive.  His parents are deceased.  His father died 3 years ago.  He has one brother who live in [place].  Review of Systems  Constitutional: Negative.   Cardiovascular: Negative for chest pain and palpitations.  Musculoskeletal: Negative.   Skin: Negative for itching and rash.  Neurological: Negative for dizziness, tremors and headaches.  Psychiatric/Behavioral: Positive for depression. Negative for hallucinations, memory loss, substance abuse and  suicidal ideas. The patient is nervous/anxious and has insomnia.     Psychiatric: Agitation: No Hallucination: No Depressed Mood: Yes Insomnia: No Hypersomnia: No Altered Concentration: No Feels Worthless: No Grandiose Ideas: No Belief In Special Powers: No New/Increased Substance Abuse: No Compulsions: No  Neurologic: Headache: No Seizure: History of seizures Paresthesias: Yes   Outpatient Encounter Prescriptions as of 11/21/2016  Medication Sig  . ALDARA 5 % cream Apply topically at bedtime.  Marland Kitchen amitriptyline (ELAVIL) 100 MG tablet Take 2 tablets (200 mg total) by mouth at bedtime.  Marland Kitchen aspirin EC 81 MG tablet Take 1 tablet (81 mg total) by mouth daily.  . clonazePAM (KLONOPIN) 1 MG tablet Take 1 tablet (1 mg total) by mouth 3 (three) times daily.  . [employer] SENNA PLUS 8.6-50 MG tablet TAKE 1 TABLET BY MOUTH AT BEDTIME AS NEEDED FOR MILD CONSTIPATION  . gabapentin (NEURONTIN) 100 MG capsule Take 1 capsule (100 mg total) by mouth 4 (four) times daily.  . hydrochlorothiazide (HYDRODIURIL) 12.5 MG tablet TAKE 1 TABLET (12.5 MG TOTAL) BY MOUTH DAILY.  . metoprolol succinate (TOPROL-XL) 50 MG 24 hr tablet TAKE 1 TABLET BY MOUTH AT BEDTIME WITH OR IMMEDIATELY FOLLOWING A MEAL  . phenytoin (DILANTIN) 100 MG ER capsule TAKE 2 CAPSULES BY MOUTH AT NIGHT  . phenytoin (DILANTIN) 50 MG tablet TAKE 1 TABLET BY MOUTH AT NIGHT  . polyethylene glycol powder (GLYCOLAX/MIRALAX) powder TAKE 17 GRAMS 2 TIMES A DAY AS NEEDED FOR CONSTIPATION. MIX INTO 8 OF FLUID AND DRINK  . QUEtiapine (SEROQUEL) 300 MG tablet Take 2 tablets (600 mg total) by mouth at bedtime.  . Selenium Sulf-Pyrithione-Urea 2.25 % SHAM APPLY TO AFFECTED AREA ON SCALP 3 TO 6 TIMES PER WEEK  . senna (CVS SENNA) 8.6 MG tablet Take 1 tablet (8.6 mg total) by mouth daily as needed for constipation.  . simvastatin (ZOCOR) 40 MG tablet TAKE 1 TABLET (40 MG TOTAL) BY MOUTH AT BEDTIME.  Marland Kitchen testosterone cypionate (DEPOTESTOSTERONE CYPIONATE) 200  MG/ML injection Inject 0.75 mLs (150 mg total) into the muscle every 14 (fourteen) days.  Marland Kitchen VIMPAT 150 MG TABS TAKE 1 TABLET BY MOUTH TWICE A DAY   No facility-administered encounter medications on file as of 11/21/2016.     Recent Results (from the past 2160 hour(s))  Testosterone Total,Free,Bio, Males     Status: Abnormal   Collection Time: 08/28/16  4:13 PM  Result Value Ref Range   Testosterone 974 (H) 250 - 827 ng/dL   Albumin 4.4 3.6 - 5.1 g/dL   Sex Hormone Binding 25 22 - 77 nmol/L   Testosterone, Free 194.9 46.0 - 224.0 pg/mL    Comment: ** Please note change in reference range(s). **      Testosterone, Bioavailable 392.4 110.0 - 575.0 ng/dL    Comment: ** Please note change in reference range(s). **     PSA     Status: None   Collection Time: 08/28/16  4:13 PM  Result Value Ref Range   PSA 0.9 <=4.0 ng/mL    Comment:   The total PSA value from this assay  system is standardized against the WHO standard. The test result will be approximately 20% lower when compared to the equimolar-standardized total PSA (Beckman Coulter). Comparison of serial PSA results should be interpreted with this fact in mind.   This test was performed using the Siemens chemiluminescent method. Values obtained from different assay methods cannot be used interchangeably. PSA levels, regardless of value, should not be interpreted as absolute evidence of the presence or absence of disease.   Effective July 23, 2016, Total PSA is being tested on the Siemens Centaur XP using chemiluminescence methodology. Re-baseline testing will be available until October 22, 2016 at no charge. If you have a patient that may require re-baselining, please order [id number] in addition to [id number].   Comprehensive metabolic panel     Status: Abnormal   Collection Time: 09/27/16  3:16 PM  Result Value Ref Range   Glucose 143 (H) 65 - 99 mg/dL   BUN 19 8 - 27 mg/dL   Creatinine, Ser 1.03 0.76 - 1.27 mg/dL   GFR calc  non Af Amer 77 >59 mL/min/1.73   GFR calc Af Amer 90 >59 mL/min/1.73   BUN/Creatinine Ratio 18 10 - 24   Sodium 140 134 - 144 mmol/L   Potassium 4.0 3.5 - 5.2 mmol/L   Chloride 97 96 - 106 mmol/L   CO2 29 18 - 29 mmol/L   Calcium 9.8 8.6 - 10.2 mg/dL   Total Protein 7.2 6.0 - 8.5 g/dL   Albumin 4.6 3.6 - 4.8 g/dL   Globulin, Total 2.6 1.5 - 4.5 g/dL   Albumin/Globulin Ratio 1.8 1.2 - 2.2   Bilirubin Total 0.2 0.0 - 1.2 mg/dL   Alkaline Phosphatase 105 39 - 117 IU/L   AST 23 0 - 40 IU/L   ALT 27 0 - 44 IU/L  CBC with Differential/Platelet     Status: None   Collection Time: 09/27/16  3:16 PM  Result Value Ref Range   WBC 5.4 3.4 - 10.8 x10E3/uL   RBC 5.12 4.14 - 5.80 x10E6/uL   Hemoglobin 16.2 12.6 - 17.7 g/dL   Hematocrit 45.8 37.5 - 51.0 %   MCV 90 79 - 97 fL   MCH 31.6 26.6 - 33.0 pg   MCHC 35.4 31.5 - 35.7 g/dL   RDW 13.6 12.3 - 15.4 %   Platelets 205 150 - 379 x10E3/uL   Neutrophils 64 Not Estab. %   Lymphs 25 Not Estab. %   Monocytes 8 Not Estab. %   Eos 2 Not Estab. %   Basos 1 Not Estab. %   Neutrophils Absolute 3.5 1.4 - 7.0 x10E3/uL   Lymphocytes Absolute 1.3 0.7 - 3.1 x10E3/uL   Monocytes Absolute 0.4 0.1 - 0.9 x10E3/uL   EOS (ABSOLUTE) 0.1 0.0 - 0.4 x10E3/uL   Basophils Absolute 0.0 0.0 - 0.2 x10E3/uL   Immature Granulocytes 0 Not Estab. %   Immature Grans (Abs) 0.0 0.0 - 0.1 x10E3/uL  Phenytoin level, total     Status: None   Collection Time: 09/27/16  3:16 PM  Result Value Ref Range   Phenytoin (Dilantin), Serum 12.6 10.0 - 20.0 ug/mL    Comment:                                 Detection Limit =  0.8                           <  0.8 Indicates None Detected       Constitutional:  There were no vitals taken for this visit.   Musculoskeletal: Strength & Muscle Tone: within normal limits Gait & Station: normal Patient leans: N/A  Psychiatric Specialty Exam: General Appearance: Casual  Eye Contact::  Fair  Speech:  Slow  Volume:  Decreased  Mood:   Anxious  Affect:  anxious- slightly improved  Thought Process:  Descriptions of Associations: Circumstantial  Orientation:  Full (Time, Place, and Person)  Thought Content:  Rumination Perceptions: denies AH/VH  Suicidal Thoughts:  No  Homicidal Thoughts:  No  Memory:  Immediate;   Fair Recent;   Fair Remote;   Fair  Judgement:  Fair  Insight:  Good  Psychomotor Activity:  Normal  Concentration:  Fair  Recall:  [employer] of Knowledge:  Fair  Language:  Good  Akathisia:  No  Handed:  Right  AIMS (if indicated):     Assets:  Communication Skills Desire for Improvement Financial Resources/Insurance Housing  ADL's:  Intact  Cognition:  Impaired,  Mild  Sleep:   fair   Established Problem, Stable/Improving (1), Review of Psycho-Social Stressors (1), Review or order clinical lab tests (1), Review of Last Therapy Session (1) and Review of Medication Regimen & Side Effects (2)  Assessment: HADRIAN LEGATES is a 60 year old male with depression with psychosis, anxiety, r/o schizoaffective disorder, seizure disorder, hypertension, GERD, who presents for follow up appointment. He is a patient of Dr. Adele Schilder.   # Unspecified anxiety disorder # MDD with psychosis # r/o schizoaffective disorder There has been an overall improvement in his mood symptoms/rumination on his "mantra" since increasing amitriptyline, although today's exam is somewhat notable for his circumstantial thought process when he is asked to elaborate about "mantra." Patient may have baseline psychotic symptoms without mood symptoms; this requires continued assessment. Will continue current medication at this time given patient preference. Discussed anticholinergic side effects of dry mouth, constipation and cardiac risk.  Plan:   1. Continue amitriptyline 200 mg at night  (EKG QTc 458 msec on 07/2015. Patient will see his PCP in a few weeks; advised patient to recheck EKG) 2. Continue Seroquel 600 mg at bedtime 3. Continue  gabapentin 100 mg 4 times a day 4. Continue Klonopin 1 mg 3 times a day 5. Return to clinic in January  The patient demonstrates the following risk factors for suicide: Chronic risk factors for suicide include: psychiatric disorder of depression, anxiety, previous suicide attempts of buying a gun and medical illness of seizure. Acute risk factors for suicide include: N/A. Protective factors for this patient include: positive social support, coping skills and hope for the future. Considering these factors, the overall suicide risk at this point appears to be low. Patient is appropriate for outpatient follow up. Discuss safety plan that anytime having active suicidal thoughts or homicidal thought than he need to call 911 or go to the local emergency room.    Norman Clay, MD 11/19/2016

## 2016-11-21 ENCOUNTER — Telehealth: Payer: Self-pay | Admitting: Family Medicine

## 2016-11-21 ENCOUNTER — Encounter (HOSPITAL_COMMUNITY): Payer: Self-pay | Admitting: Psychiatry

## 2016-11-21 ENCOUNTER — Ambulatory Visit (INDEPENDENT_AMBULATORY_CARE_PROVIDER_SITE_OTHER): Payer: Medicaid Other | Admitting: Psychiatry

## 2016-11-21 VITALS — BP 140/86 | HR 81 | Ht 71.0 in | Wt 206.2 lb

## 2016-11-21 DIAGNOSIS — F251 Schizoaffective disorder, depressive type: Secondary | ICD-10-CM | POA: Diagnosis not present

## 2016-11-21 DIAGNOSIS — Z79899 Other long term (current) drug therapy: Secondary | ICD-10-CM | POA: Diagnosis not present

## 2016-11-21 MED ORDER — QUETIAPINE FUMARATE 300 MG PO TABS: 600.0000 mg | ORAL_TABLET | Freq: Every day | ORAL | 1 refills | Status: DC

## 2016-11-21 MED ORDER — GABAPENTIN 100 MG PO CAPS: 100.0000 mg | ORAL_CAPSULE | Freq: Four times a day (QID) | ORAL | 1 refills | Status: DC

## 2016-11-21 MED ORDER — CLONAZEPAM 1 MG PO TABS: 1.0000 mg | ORAL_TABLET | Freq: Three times a day (TID) | ORAL | 1 refills | Status: DC

## 2016-11-21 MED ORDER — AMITRIPTYLINE HCL 100 MG PO TABS: 200.0000 mg | ORAL_TABLET | Freq: Every day | ORAL | 1 refills | Status: DC

## 2016-11-21 NOTE — Patient Instructions (Signed)
1. Continue amitriptyline 200 mg at night  2. Continue Seroquel 600 mg at bedtime 3. Continue gabapentin 100 mg 4 times a day 4. Continue Klonopin 1 mg 3 times a day 5. Return to clinic in January

## 2016-11-21 NOTE — Telephone Encounter (Signed)
Pt would like PCP to call to discuss what the urologist said. Pt said try cell first, if no answer try the home number. Please advise. Thanks! ep

## 2016-11-22 NOTE — Telephone Encounter (Signed)
Pt would like dr Ardelia Mems to call his urlogist to find out what was said.

## 2016-11-23 NOTE — Telephone Encounter (Signed)
I don't have any faxed records from the Urology office.I called [facility], state patient had visit on 12/4. They will fax over records from that visit to me. Once I have them & have reviewed them I will contact patient to see what his questions are.  Leeanne Rio, MD

## 2016-11-27 NOTE — Telephone Encounter (Signed)
I still haven't received any faxed notes. Red team, can you call [facility] again and ask them to re-fax?  Thanks, Leeanne Rio, MD

## 2016-11-29 ENCOUNTER — Ambulatory Visit (HOSPITAL_COMMUNITY): Payer: Self-pay | Admitting: Clinical

## 2016-11-29 NOTE — Telephone Encounter (Signed)
Attempted to call [facility] but they close at 5, will try again tomorrow. Gotti Alwin Kennon Holter, CMA

## 2016-11-30 NOTE — Telephone Encounter (Signed)
Spoke to [facility] urology and they are sending over records. [name], CMA

## 2016-12-03 NOTE — Telephone Encounter (Signed)
Records received. Called patient's cell phone and left message asking him to call back. Need to hear exactly what his questions are. I have the records on my desk for when he calls back. Will try to call him again tomorrow.  Leeanne Rio, MD

## 2016-12-04 NOTE — Telephone Encounter (Signed)
Called patient back and was able to reach him. His main question was who is going to manage his testosterone moving forward - me or his urologist Dr. Junious Silk. Advised patient that I would really like urology to manage testosterone (that is why I have referred him in the past) due to his prior orchiectomy, BPH, erectile dysfunction, and abnormal PSA.   I called [facility] and spoke with Dr. Lyndal Rainbow nurse as Dr. Junious Silk is out of the office this week. Asked that they manage his testosterone supplementation from here on out. She said she would relay message to Dr. Junious Silk.  Leeanne Rio, MD

## 2016-12-06 ENCOUNTER — Inpatient Hospital Stay (HOSPITAL_COMMUNITY): Payer: Medicaid Other

## 2016-12-06 ENCOUNTER — Encounter (HOSPITAL_COMMUNITY): Payer: Self-pay | Admitting: Emergency Medicine

## 2016-12-06 ENCOUNTER — Telehealth: Payer: Self-pay | Admitting: *Deleted

## 2016-12-06 ENCOUNTER — Emergency Department (HOSPITAL_COMMUNITY): Payer: Medicaid Other

## 2016-12-06 ENCOUNTER — Inpatient Hospital Stay (HOSPITAL_COMMUNITY)
Admission: EM | Admit: 2016-12-06 | Discharge: 2016-12-09 | DRG: 871 | Disposition: A | Discharge status: Home / Self Care | Payer: Medicaid Other | Attending: Family Medicine | Admitting: Family Medicine

## 2016-12-06 DIAGNOSIS — B952 Enterococcus as the cause of diseases classified elsewhere: Secondary | ICD-10-CM | POA: Diagnosis present

## 2016-12-06 DIAGNOSIS — Z915 Personal history of self-harm: Secondary | ICD-10-CM | POA: Diagnosis not present

## 2016-12-06 DIAGNOSIS — R8271 Bacteriuria: Secondary | ICD-10-CM | POA: Diagnosis present

## 2016-12-06 DIAGNOSIS — Z882 Allergy status to sulfonamides status: Secondary | ICD-10-CM | POA: Diagnosis not present

## 2016-12-06 DIAGNOSIS — Z885 Allergy status to narcotic agent status: Secondary | ICD-10-CM

## 2016-12-06 DIAGNOSIS — G40909 Epilepsy, unspecified, not intractable, without status epilepticus: Secondary | ICD-10-CM

## 2016-12-06 DIAGNOSIS — F259 Schizoaffective disorder, unspecified: Secondary | ICD-10-CM | POA: Diagnosis present

## 2016-12-06 DIAGNOSIS — J219 Acute bronchiolitis, unspecified: Secondary | ICD-10-CM | POA: Diagnosis present

## 2016-12-06 DIAGNOSIS — W19XXXD Unspecified fall, subsequent encounter: Secondary | ICD-10-CM | POA: Diagnosis not present

## 2016-12-06 DIAGNOSIS — F322 Major depressive disorder, single episode, severe without psychotic features: Secondary | ICD-10-CM | POA: Diagnosis present

## 2016-12-06 DIAGNOSIS — J211 Acute bronchiolitis due to human metapneumovirus: Secondary | ICD-10-CM | POA: Diagnosis not present

## 2016-12-06 DIAGNOSIS — R509 Fever, unspecified: Secondary | ICD-10-CM

## 2016-12-06 DIAGNOSIS — F419 Anxiety disorder, unspecified: Secondary | ICD-10-CM | POA: Diagnosis present

## 2016-12-06 DIAGNOSIS — J208 Acute bronchitis due to other specified organisms: Secondary | ICD-10-CM | POA: Diagnosis not present

## 2016-12-06 DIAGNOSIS — G4730 Sleep apnea, unspecified: Secondary | ICD-10-CM | POA: Diagnosis present

## 2016-12-06 DIAGNOSIS — Z96612 Presence of left artificial shoulder joint: Secondary | ICD-10-CM | POA: Diagnosis present

## 2016-12-06 DIAGNOSIS — I10 Essential (primary) hypertension: Secondary | ICD-10-CM | POA: Diagnosis present

## 2016-12-06 DIAGNOSIS — J9601 Acute respiratory failure with hypoxia: Secondary | ICD-10-CM | POA: Diagnosis present

## 2016-12-06 DIAGNOSIS — K5900 Constipation, unspecified: Secondary | ICD-10-CM | POA: Diagnosis present

## 2016-12-06 DIAGNOSIS — Z7982 Long term (current) use of aspirin: Secondary | ICD-10-CM | POA: Diagnosis not present

## 2016-12-06 DIAGNOSIS — K219 Gastro-esophageal reflux disease without esophagitis: Secondary | ICD-10-CM | POA: Diagnosis present

## 2016-12-06 DIAGNOSIS — W19XXXA Unspecified fall, initial encounter: Secondary | ICD-10-CM | POA: Diagnosis present

## 2016-12-06 DIAGNOSIS — Z79899 Other long term (current) drug therapy: Secondary | ICD-10-CM

## 2016-12-06 DIAGNOSIS — R05 Cough: Secondary | ICD-10-CM

## 2016-12-06 DIAGNOSIS — R6510 Systemic inflammatory response syndrome (SIRS) of non-infectious origin without acute organ dysfunction: Secondary | ICD-10-CM | POA: Diagnosis not present

## 2016-12-06 DIAGNOSIS — E785 Hyperlipidemia, unspecified: Secondary | ICD-10-CM | POA: Diagnosis present

## 2016-12-06 DIAGNOSIS — R531 Weakness: Secondary | ICD-10-CM | POA: Diagnosis not present

## 2016-12-06 DIAGNOSIS — J189 Pneumonia, unspecified organism: Secondary | ICD-10-CM | POA: Diagnosis present

## 2016-12-06 DIAGNOSIS — R6889 Other general symptoms and signs: Secondary | ICD-10-CM

## 2016-12-06 DIAGNOSIS — A419 Sepsis, unspecified organism: Principal | ICD-10-CM | POA: Diagnosis present

## 2016-12-06 DIAGNOSIS — K5909 Other constipation: Secondary | ICD-10-CM | POA: Diagnosis present

## 2016-12-06 DIAGNOSIS — R059 Cough, unspecified: Secondary | ICD-10-CM

## 2016-12-06 LAB — I-STAT CG4 LACTIC ACID, ED
Lactic Acid, Venous: 2.61 mmol/L (ref 0.5–1.9)
Lactic Acid, Venous: 1.4 mmol/L (ref 0.5–1.9)

## 2016-12-06 LAB — CBC WITH DIFFERENTIAL/PLATELET
Basophils Absolute: 0 10*3/uL (ref 0.0–0.1)
Basophils Relative: 0 %
Eosinophils Absolute: 0.1 10*3/uL (ref 0.0–0.7)
Eosinophils Relative: 0 %
HCT: 44.2 % (ref 39.0–52.0)
Hemoglobin: 15.8 g/dL (ref 13.0–17.0)
Lymphocytes Relative: 10 %
Lymphs Abs: 1.4 10*3/uL (ref 0.7–4.0)
MCH: 31.2 pg (ref 26.0–34.0)
MCHC: 35.7 g/dL (ref 30.0–36.0)
MCV: 87.4 fL (ref 78.0–100.0)
Monocytes Absolute: 1.1 10*3/uL — ABNORMAL HIGH (ref 0.1–1.0)
Monocytes Relative: 8 %
Neutro Abs: 11.1 10*3/uL — ABNORMAL HIGH (ref 1.7–7.7)
Neutrophils Relative %: 82 %
Platelets: 185 10*3/uL (ref 150–400)
RBC: 5.06 MIL/uL (ref 4.22–5.81)
RDW: 15.4 % (ref 11.5–15.5)
WBC: 13.7 10*3/uL — ABNORMAL HIGH (ref 4.0–10.5)

## 2016-12-06 LAB — COMPREHENSIVE METABOLIC PANEL
ALT: 20 U/L (ref 17–63)
AST: 24 U/L (ref 15–41)
Albumin: 3.9 g/dL (ref 3.5–5.0)
Alkaline Phosphatase: 79 U/L (ref 38–126)
Anion gap: 11 (ref 5–15)
BUN: 17 mg/dL (ref 6–20)
CO2: 23 mmol/L (ref 22–32)
Calcium: 8.5 mg/dL — ABNORMAL LOW (ref 8.9–10.3)
Chloride: 99 mmol/L — ABNORMAL LOW (ref 101–111)
Creatinine, Ser: 1.07 mg/dL (ref 0.61–1.24)
GFR calc Af Amer: >60 mL/min (ref >60)
GFR calc non Af Amer: >60 mL/min (ref >60)
Glucose, Bld: 184 mg/dL — ABNORMAL HIGH (ref 65–99)
Potassium: 3.7 mmol/L (ref 3.5–5.1)
Sodium: 133 mmol/L — ABNORMAL LOW (ref 135–145)
Total Bilirubin: 0.5 mg/dL (ref 0.3–1.2)
Total Protein: 7 g/dL (ref 6.5–8.1)

## 2016-12-06 LAB — URINALYSIS, ROUTINE W REFLEX MICROSCOPIC
Bilirubin Urine: NEGATIVE
Glucose, UA: >=500 mg/dL — AB
Hgb urine dipstick: NEGATIVE
Ketones, ur: NEGATIVE mg/dL
Nitrite: NEGATIVE
Protein, ur: NEGATIVE mg/dL
Specific Gravity, Urine: 1.011 (ref 1.005–1.030)
pH: 5 (ref 5.0–8.0)

## 2016-12-06 LAB — CBC
HCT: 42.9 % (ref 39.0–52.0)
Hemoglobin: 15.1 g/dL (ref 13.0–17.0)
MCH: 30.8 pg (ref 26.0–34.0)
MCHC: 35.2 g/dL (ref 30.0–36.0)
MCV: 87.4 fL (ref 78.0–100.0)
Platelets: 169 10*3/uL (ref 150–400)
RBC: 4.91 MIL/uL (ref 4.22–5.81)
RDW: 15.6 % — ABNORMAL HIGH (ref 11.5–15.5)
WBC: 11.1 10*3/uL — ABNORMAL HIGH (ref 4.0–10.5)

## 2016-12-06 LAB — CREATININE, SERUM
Creatinine, Ser: 0.98 mg/dL (ref 0.61–1.24)
GFR calc Af Amer: >60 mL/min (ref >60)
GFR calc non Af Amer: >60 mL/min (ref >60)

## 2016-12-06 LAB — INFLUENZA PANEL BY PCR (TYPE A & B)
Influenza A By PCR: NEGATIVE
Influenza B By PCR: NEGATIVE

## 2016-12-06 LAB — STREP PNEUMONIAE URINARY ANTIGEN: Strep Pneumo Urinary Antigen: NEGATIVE

## 2016-12-06 LAB — PHENYTOIN LEVEL, TOTAL: Phenytoin Lvl: 6.3 ug/mL — ABNORMAL LOW (ref 10.0–20.0)

## 2016-12-06 MED ORDER — IPRATROPIUM-ALBUTEROL 0.5-2.5 (3) MG/3ML IN SOLN
3.0000 mL | Freq: Once | RESPIRATORY_TRACT | Status: AC
  Administered 2016-12-06: 3 mL via RESPIRATORY_TRACT
  Filled 2016-12-06: qty 3

## 2016-12-06 MED ORDER — ASPIRIN EC 81 MG PO TBEC
81.0000 mg | DELAYED_RELEASE_TABLET | Freq: Every day | ORAL | Status: DC
  Administered 2016-12-06 – 2016-12-09 (×4): 81 mg via ORAL
  Filled 2016-12-06 (×4): qty 1

## 2016-12-06 MED ORDER — SODIUM CHLORIDE 0.9 % IV SOLN
INTRAVENOUS | Status: DC
  Administered 2016-12-06 – 2016-12-09 (×8): via INTRAVENOUS

## 2016-12-06 MED ORDER — PSYLLIUM 95 % PO PACK
1.0000 | PACK | Freq: Every day | ORAL | Status: DC
  Administered 2016-12-06 – 2016-12-08 (×3): 1 via ORAL
  Filled 2016-12-06 (×4): qty 1

## 2016-12-06 MED ORDER — SODIUM CHLORIDE 0.9 % IV BOLUS (SEPSIS)
1000.0000 mL | Freq: Once | INTRAVENOUS | Status: AC
  Administered 2016-12-06: 1000 mL via INTRAVENOUS

## 2016-12-06 MED ORDER — ALBUTEROL SULFATE (2.5 MG/3ML) 0.083% IN NEBU: 2.5000 mg | INHALATION_SOLUTION | RESPIRATORY_TRACT | Status: DC | PRN

## 2016-12-06 MED ORDER — PHENYTOIN 50 MG PO CHEW
100.0000 mg | CHEWABLE_TABLET | Freq: Once | ORAL | Status: AC
  Administered 2016-12-06: 100 mg via ORAL
  Filled 2016-12-06: qty 2

## 2016-12-06 MED ORDER — PHENYTOIN SODIUM EXTENDED 100 MG PO CAPS
100.0000 mg | ORAL_CAPSULE | Freq: Every day | ORAL | Status: DC
  Filled 2016-12-06: qty 1

## 2016-12-06 MED ORDER — DEXTROSE 5 % IV SOLN
1.0000 g | Freq: Once | INTRAVENOUS | Status: AC
  Administered 2016-12-06: 1 g via INTRAVENOUS
  Filled 2016-12-06: qty 10

## 2016-12-06 MED ORDER — PHENYTOIN 50 MG PO CHEW
50.0000 mg | CHEWABLE_TABLET | Freq: Every day | ORAL | Status: DC
  Administered 2016-12-06 – 2016-12-08 (×3): 50 mg via ORAL
  Filled 2016-12-06 (×3): qty 1

## 2016-12-06 MED ORDER — METHYLPREDNISOLONE SODIUM SUCC 125 MG IJ SOLR
125.0000 mg | Freq: Once | INTRAMUSCULAR | Status: AC
  Administered 2016-12-06: 125 mg via INTRAVENOUS
  Filled 2016-12-06: qty 2

## 2016-12-06 MED ORDER — PHENYTOIN SODIUM EXTENDED 100 MG PO CAPS
200.0000 mg | ORAL_CAPSULE | Freq: Every day | ORAL | Status: DC
  Administered 2016-12-06 – 2016-12-08 (×3): 200 mg via ORAL
  Filled 2016-12-06 (×3): qty 2

## 2016-12-06 MED ORDER — ENOXAPARIN SODIUM 40 MG/0.4ML ~~LOC~~ SOLN
40.0000 mg | SUBCUTANEOUS | Status: DC
  Administered 2016-12-06 – 2016-12-08 (×3): 40 mg via SUBCUTANEOUS
  Filled 2016-12-06 (×3): qty 0.4

## 2016-12-06 MED ORDER — LACOSAMIDE 150 MG PO TABS: 1.0000 | ORAL_TABLET | Freq: Two times a day (BID) | ORAL | Status: DC

## 2016-12-06 MED ORDER — POLYETHYLENE GLYCOL 3350 17 G PO PACK
17.0000 g | PACK | Freq: Every day | ORAL | Status: DC | PRN
  Administered 2016-12-06 – 2016-12-08 (×2): 17 g via ORAL
  Filled 2016-12-06 (×3): qty 1

## 2016-12-06 MED ORDER — QUETIAPINE FUMARATE 300 MG PO TABS
600.0000 mg | ORAL_TABLET | Freq: Every day | ORAL | Status: DC
  Administered 2016-12-06 – 2016-12-08 (×3): 600 mg via ORAL
  Filled 2016-12-06 (×3): qty 2

## 2016-12-06 MED ORDER — IPRATROPIUM-ALBUTEROL 0.5-2.5 (3) MG/3ML IN SOLN
3.0000 mL | Freq: Two times a day (BID) | RESPIRATORY_TRACT | Status: DC
  Administered 2016-12-07 – 2016-12-09 (×5): 3 mL via RESPIRATORY_TRACT
  Filled 2016-12-06 (×6): qty 3

## 2016-12-06 MED ORDER — DEXTROSE 5 % IV SOLN
500.0000 mg | INTRAVENOUS | Status: DC
  Administered 2016-12-06 – 2016-12-08 (×3): 500 mg via INTRAVENOUS
  Filled 2016-12-06 (×3): qty 500

## 2016-12-06 MED ORDER — HYDRALAZINE HCL 20 MG/ML IJ SOLN: 10.0000 mg | Freq: Three times a day (TID) | INTRAMUSCULAR | Status: DC | PRN

## 2016-12-06 MED ORDER — DEXTROSE 5 % IV SOLN
500.0000 mg | Freq: Once | INTRAVENOUS | Status: AC
  Administered 2016-12-06: 500 mg via INTRAVENOUS
  Filled 2016-12-06: qty 500

## 2016-12-06 MED ORDER — IBUPROFEN 200 MG PO TABS
400.0000 mg | ORAL_TABLET | Freq: Once | ORAL | Status: AC
  Administered 2016-12-06: 400 mg via ORAL
  Filled 2016-12-06: qty 2

## 2016-12-06 MED ORDER — AMITRIPTYLINE HCL 100 MG PO TABS
200.0000 mg | ORAL_TABLET | Freq: Every day | ORAL | Status: DC
  Administered 2016-12-06 – 2016-12-08 (×3): 200 mg via ORAL
  Filled 2016-12-06 (×2): qty 8
  Filled 2016-12-06 (×3): qty 2

## 2016-12-06 MED ORDER — LORAZEPAM 2 MG/ML IJ SOLN: 2.0000 mg | INTRAMUSCULAR | Status: DC | PRN

## 2016-12-06 MED ORDER — CLONAZEPAM 0.5 MG PO TABS
1.0000 mg | ORAL_TABLET | Freq: Three times a day (TID) | ORAL | Status: DC
  Administered 2016-12-06 – 2016-12-09 (×10): 1 mg via ORAL
  Filled 2016-12-06 (×10): qty 2

## 2016-12-06 MED ORDER — IPRATROPIUM-ALBUTEROL 0.5-2.5 (3) MG/3ML IN SOLN
3.0000 mL | Freq: Four times a day (QID) | RESPIRATORY_TRACT | Status: DC
  Administered 2016-12-06 (×2): 3 mL via RESPIRATORY_TRACT
  Filled 2016-12-06 (×2): qty 3

## 2016-12-06 MED ORDER — AMITRIPTYLINE HCL 25 MG PO TABS: 100.0000 mg | ORAL_TABLET | Freq: Every day | ORAL | Status: DC

## 2016-12-06 MED ORDER — SIMVASTATIN 40 MG PO TABS
40.0000 mg | ORAL_TABLET | Freq: Every day | ORAL | Status: DC
  Administered 2016-12-06 – 2016-12-08 (×3): 40 mg via ORAL
  Filled 2016-12-06 (×3): qty 1

## 2016-12-06 MED ORDER — DEXTROSE 5 % IV SOLN
1.0000 g | INTRAVENOUS | Status: DC
  Administered 2016-12-06 – 2016-12-08 (×3): 1 g via INTRAVENOUS
  Filled 2016-12-06 (×3): qty 10

## 2016-12-06 MED ORDER — AMITRIPTYLINE HCL 25 MG PO TABS
100.0000 mg | ORAL_TABLET | Freq: Once | ORAL | Status: DC
  Filled 2016-12-06 (×3): qty 4

## 2016-12-06 MED ORDER — LACOSAMIDE 50 MG PO TABS
150.0000 mg | ORAL_TABLET | Freq: Two times a day (BID) | ORAL | Status: DC
  Administered 2016-12-06 – 2016-12-09 (×7): 150 mg via ORAL
  Filled 2016-12-06 (×7): qty 3

## 2016-12-06 MED ORDER — ALBUTEROL SULFATE (2.5 MG/3ML) 0.083% IN NEBU
5.0000 mg | INHALATION_SOLUTION | Freq: Once | RESPIRATORY_TRACT | Status: AC
  Administered 2016-12-06: 5 mg via RESPIRATORY_TRACT
  Filled 2016-12-06: qty 6

## 2016-12-06 NOTE — H&P (Addendum)
[name] History and Physical  [name] [id number] DOB: November 16, 1956 DOA: 12/06/2016  Referring physician:  PCP: Chrisandra Netters, MD   Chief Complaint: "Aching all over and weak."  HPI: [name] is a 60 y.o. male with past medical history significant for anxiety, depression, high cholesterol, reflux, memory loss, MRSA and mental illness presented to the hospital with complaint of weakness.  Patient states that since Tuesday he started having body aches and felt weak. He is vomited once. Patient has continued to feel nauseous radiated. Patient denies any shortness of breath. But has had a cough that was productive with green sputum early during his illness. Patient states he began to have a fever on the second day. Temperature was 100.5. Temperature then reached 101 did not respond to a single dose of Tylenol. Patient then decided to later go to the emergency room because his weakness continued to worsen. Patient denies any sick contacts. Patient did not have a flu shot this year. Patient has no history of pneumonia.  ED course: Patient given 3 L of IV normal saline. Also given Rocephin and azithromycin for presumed pneumonia. Patient given 1 DuoNeb treatment and one albuterol treatment that eased his shortness of breath. Patient's lactic acid which was elevated normalized. Patient also given one IV dose of Solu-Medrol at 125 mg. Hospitalist was consulted for admission.  Review of Systems:  As per HPI otherwise 10 point review of systems negative.    Past Medical History:  Diagnosis Date  . Anxiety   . Constipation   . Depression   . Dyslipidemia   . GERD (gastroesophageal reflux disease)   . Hearing loss   . Hypertension   . Memory loss   . MRSA carrier   . Schizoaffective disorder   . Seizures ([place])    Past Surgical History:  Procedure Laterality Date  . CARPAL TUNNEL RELEASE    . COLONOSCOPY  2010  . ORIF SHOULDER FRACTURE    . self orchectomy    .  SHOULDER CLOSED REDUCTION Left 09/16/2013   Procedure: CLOSED REDUCTION SHOULDER;  Surgeon: Mauri Pole, MD;  Location: [facility];  Service: Orthopedics;  Laterality: Left;  . SHOULDER HEMI-ARTHROPLASTY Left 09/18/2013   Procedure: LEFT SHOULDER HEMI-ARTHROPLASTY;  Surgeon: Augustin Schooling, MD;  Location: [facility];  Service: Orthopedics;  Laterality: Left;   Social History:  reports that he has never smoked. He has never used smokeless tobacco. He reports that he does not drink alcohol or use drugs.  Allergies  Allergen Reactions  . Codeine Nausea And Vomiting  . Sulfa Antibiotics Nausea And Vomiting    Family History  Problem Relation Age of Onset  . Stroke Father     Living at 65  . Stroke Mother     Stroke in late 89's. Died in her 43's  . Alcohol abuse Brother   . Alcohol abuse Brother      Prior to Admission medications   Medication Sig Start Date End Date Taking? Authorizing Provider  amitriptyline (ELAVIL) 100 MG tablet Take 2 tablets (200 mg total) by mouth at bedtime. 11/21/16  Yes Norman Clay, MD  aspirin EC 81 MG tablet Take 1 tablet (81 mg total) by mouth daily. 07/21/15  Yes Benjamine Mola, FNP  clonazePAM (KLONOPIN) 1 MG tablet Take 1 tablet (1 mg total) by mouth 3 (three) times daily. 11/21/16  Yes Norman Clay, MD  [facility] SENNA PLUS 8.6-50 MG tablet TAKE 1 TABLET BY MOUTH AT BEDTIME AS NEEDED  FOR MILD CONSTIPATION 08/29/16  Yes Leeanne Rio, MD  hydrochlorothiazide (HYDRODIURIL) 12.5 MG tablet TAKE 1 TABLET (12.5 MG TOTAL) BY MOUTH DAILY. 09/28/16  Yes Leeanne Rio, MD  metoprolol succinate (TOPROL-XL) 50 MG 24 hr tablet TAKE 1 TABLET BY MOUTH AT BEDTIME WITH OR IMMEDIATELY FOLLOWING A MEAL 06/22/16  Yes Leeanne Rio, MD  Multiple Vitamin (MULTIVITAMIN WITH MINERALS) TABS tablet Take 1 tablet by mouth daily.   Yes Historical Provider, MD  phenytoin (DILANTIN) 100 MG ER capsule TAKE 2 CAPSULES BY MOUTH AT NIGHT 04/26/16  Yes Kathrynn Ducking, MD  phenytoin  (DILANTIN) 50 MG tablet TAKE 1 TABLET BY MOUTH AT NIGHT 04/26/16  Yes Kathrynn Ducking, MD  polyethylene glycol powder (GLYCOLAX/MIRALAX) powder TAKE 17 GRAMS 2 TIMES A DAY AS NEEDED FOR CONSTIPATION. MIX INTO 8 OF FLUID AND DRINK 08/01/16  Yes Leeanne Rio, MD  QUEtiapine (SEROQUEL) 300 MG tablet Take 2 tablets (600 mg total) by mouth at bedtime. 11/21/16  Yes Norman Clay, MD  simvastatin (ZOCOR) 40 MG tablet TAKE 1 TABLET (40 MG TOTAL) BY MOUTH AT BEDTIME. 08/01/16  Yes Leeanne Rio, MD  testosterone cypionate (DEPOTESTOSTERONE CYPIONATE) 200 MG/ML injection Inject 0.75 mLs (150 mg total) into the muscle every 14 (fourteen) days. 09/12/16  Yes Leeanne Rio, MD  VIMPAT 150 MG TABS TAKE 1 TABLET BY MOUTH TWICE A DAY 09/28/16  Yes Kathrynn Ducking, MD   Physical Exam: Vitals:   12/06/16 0615 12/06/16 0630 12/06/16 0645 12/06/16 0700  BP: 142/77 138/78 137/85 124/73  Pulse: 108 107 106 110  Resp:      Temp:      TempSrc:      SpO2: 96% 97% 96% 94%  Weight:      Height:        Wt Readings from Last 3 Encounters:  12/06/16 90.7 kg (200 lb)  09/27/16 92.4 kg (203 lb 12.8 oz)  06/11/16 91.6 kg (202 lb)    General:  Appears calm and comfortable, Ill-appearing, Alert and oriented 3 Eyes:  PERRL, EOMI, normal lids, iris ENT:  grossly normal hearing, lips & tongue Neck:  no LAD, masses or thyromegaly Cardiovascular:  RRR, no m/r/g. No LE edema.  Respiratory:  Diffuse wheezes/r. Normal respiratory effort. Abdomen:  soft, ntnd Skin:  no rash or induration seen on limited exam Musculoskeletal:  grossly normal tone BUE/BLE Psychiatric:  grossly normal mood and affect, speech fluent and appropriate Neurologic:  CN 2-12 grossly intact, moves all extremities in coordinated fashion.          Labs on Admission:  Basic Metabolic Panel:  Recent Labs Lab 12/06/16 0328  NA 133*  K 3.7  CL 99*  CO2 23  GLUCOSE 184*  BUN 17  CREATININE 1.07  CALCIUM 8.5*   Liver  Function Tests:  Recent Labs Lab 12/06/16 0328  AST 24  ALT 20  ALKPHOS 79  BILITOT 0.5  PROT 7.0  ALBUMIN 3.9   No results for input(s): LIPASE, AMYLASE in the last 168 hours. No results for input(s): AMMONIA in the last 168 hours. CBC:  Recent Labs Lab 12/06/16 0328  WBC 13.7*  NEUTROABS 11.1*  HGB 15.8  HCT 44.2  MCV 87.4  PLT 185   Cardiac Enzymes: No results for input(s): CKTOTAL, CKMB, CKMBINDEX, TROPONINI in the last 168 hours.  BNP (last 3 results) No results for input(s): BNP in the last 8760 hours.  ProBNP (last 3 results) No results for input(s): PROBNP  in the last 8760 hours.   Serum creatinine: 1.07 mg/dL 12/06/16 0328 Estimated creatinine clearance: 82.5 mL/min  CBG: No results for input(s): GLUCAP in the last 168 hours.  Radiological Exams on Admission: Dg Chest 2 View  Result Date: 12/06/2016 CLINICAL DATA:  60 year old male with fever and wheezing. Shortness of breath. EXAM: CHEST  2 VIEW COMPARISON:  Chest radiograph dated 02/15/2015 FINDINGS: Shallow inspiration. No focal consolidation, pleural effusion, or pneumothorax. Minimal left lung base linear atelectasis/ scarring. The cardiac silhouette is within normal limits. No acute osseous pathology identified. Left shoulder arthroplasty and right humeral head fixation screws noted. IMPRESSION: No active cardiopulmonary disease. Electronically Signed   By: Anner Crete M.D.   On: 12/06/2016 03:29    EKG: Independently reviewed. Ventricular rate 112, PR interval 120, QRS 96, QTC 429; sinus tachycardia, left anterior fascicular block, slow R-wave progression-compared to August 2016 EKG left anterior fascicular block is new  Assessment/Plan Active Problems:   Hyperlipidemia   Seizure disorder (HCC)   Constipation, chronic   Schizoaffective disorder (HCC)   Severe major depression (HCC)   Essential hypertension   Falls   Sepsis ([place])   Mild sleep apnea  Sepsis with Resp origin Scheduled  DuoNeb's When necessary albuterol Antibiotic-azithromycin and Rocephin Oxygen therapy Continuous pulse oximetry Sputm cult ordered Flu panel pending Strep pneumo urine pending  Hypertension When necessary hydralazine 10 mg IV as needed for severe blood pressure Hold hydrochlorothiazide and Toprol-XL  Schizoaffective/depression Continue Elavil, Klonopin, Seroquel No SI/HI  Seizures Serious precautions Ativan when necessary Continue Vimpat, Dilantin  Antigen deficiency Hold testosterone  Chronic constipation MiraLAX prn  Hyperlipidemia Continue statin  Sleep apnea mild RT consult Cont pulseox   Code Status: Full DVT Prophylaxis: Lovenox Family Communication: Patient states he has no family but would like [name] his girlfriend notified in the event of emergency at phone number [phone] Disposition Plan: Pending Improvement  Inpt SDU  Elwin Mocha, MD Family Medicine [name] [email or web address] Password [email or web address]

## 2016-12-06 NOTE — ED Notes (Signed)
Patient returned from X-ray 

## 2016-12-06 NOTE — ED Notes (Signed)
Triage done by Lanell Matar, RN

## 2016-12-06 NOTE — ED Triage Notes (Signed)
Per EMS pt has 2 day hx of generalized weakness, dry cough, and fever. Pt took tylenol 2 hours ago. Last temp was 100 approx 30 minutes ago. Pt also complaining of nausea. Diaphoresis noted by EMS. CBG was 208. No hx of diabetes. Decreased lung sounds in left lower.

## 2016-12-06 NOTE — Telephone Encounter (Signed)
Pt calling in stating that he is currently in the hospital at [facility] long. Says he is on 2 seizure medications, but they wont give him his dilantin. Wants to see if dr Ardelia Mems would come see him. Told him dr Ardelia Mems was out and that I will inform the dr covering her box. Please advise. Lavin Petteway Kennon Holter, CMA

## 2016-12-06 NOTE — ED Notes (Signed)
Bed: [id number] Expected date:  Expected time:  Means of arrival:  Comments: EMS 60 yo male fever 102/tylenol/decreased temp to 100-

## 2016-12-06 NOTE — Telephone Encounter (Signed)
Please let him know that Dr Jerilynn Mages is off this week but she will be able to see his hospital course at [facility] and can see him in the office when he is released  Thanks  [facility]

## 2016-12-06 NOTE — Plan of Care (Signed)
Problem: Safety: Goal: Ability to remain free from injury will improve Outcome: Progressing Pt high fall risk at this time. Pt verbalizes understanding of fall prevention education and verbalizes that he will press the call bell before getting up. Bed alarm on.

## 2016-12-06 NOTE — ED Provider Notes (Signed)
[facility] DEPT Provider Note   CSN: [id number] Arrival date & time: 12/06/16  0207 By signing my name below, I, [name], attest that this documentation has been prepared under the direction and in the presence of [name] Fuel, MD. Electronically Signed: Doran [name], ED Scribe. 12/06/16. 3:23 AM.  History   Chief Complaint Chief Complaint  Patient presents with  . Cough  . Fever  . Weakness   The history is provided by the patient. No language interpreter was used.   HPI Comments: [name] is a 60 y.o. male who presents to the Emergency Department with a PMHx of HTN  complaining of a productive cough with "whitish-yellow" phlegm  that began 2 days ago. Pt also reports fever Tmax 101.3 F yesterday, wheezing, chills, and resolved body aches. Pt denies any aggravating factors. Pt reports relief after receiving breathing treatment in the ED. Pt took 3 Tylenol tablets 2-3 hours ago and 2 Aspirins this morning with no relief. Pt denies diaphoresis, difficulty urinating, sore throat, N/V/D, or any other symptoms at this time. Pt had flu vaccination 8 months ago.   Past Medical History:  Diagnosis Date  . Anxiety   . Constipation   . Depression   . Dyslipidemia   . GERD (gastroesophageal reflux disease)   . Hearing loss   . Hypertension   . Memory loss   . MRSA carrier   . Schizoaffective disorder   . Seizures [facility])     Patient Active Problem List   Diagnosis Date Noted  . Mild sleep apnea 06/15/2016  . Actinic keratosis 06/15/2016  . Nocturnal dyspnea 02/03/2016  . Skin lesion of face 02/03/2016  . Generalized weakness 01/17/2016  . Paresthesia of both hands 09/16/2015  . Phenytoin toxicity   . TIA (transient ischemic attack) 07/29/2015  . Vertigo 07/29/2015  . Schizoaffective disorder, depressive type with good prognostic features ([place]) 07/13/2015  . Constipation   . Verruca 05/18/2015  . Seborrheic dermatitis of scalp 05/18/2015  . MDD (major depressive  disorder), recurrent severe, without psychosis ([place])   . OCD (obsessive compulsive disorder)   . Confusion 12/31/2014  . Seizures ([place])   . Schizoaffective disorder, depressive type ([place])   . Orthostatic hypotension   . Falls   . Seizure ([place]) 11/18/2014  . Schizoaffective disorder, unspecified type ([place])   . Essential hypertension   . Overdose of beta-adrenergic antagonist drug 10/22/2014  . Overdose   . Nocturnal enuresis 03/23/2014  . Benzodiazepine dependence, continuous ([place]) 01/27/2014  . Severe major depression ([place]) 01/21/2014  . Frozen shoulder syndrome 09/20/2013  . Somnolence 09/20/2013  . Schizoaffective disorder ([employer]) 09/20/2013  . Malaise and fatigue 04/21/2013  . Presbyopia 04/25/2012  . Androgen deficiency 09/07/2011  . Constipation, chronic 09/07/2011  . Other specified disorders of liver 12/30/2008  . Overweight(278.02) 09/08/2008  . Hyperlipidemia 01/28/2008  . Seizure disorder ([place]) 01/28/2008  . OSTEOPENIA 07/01/2007    Past Surgical History:  Procedure Laterality Date  . CARPAL TUNNEL RELEASE    . COLONOSCOPY  2010  . ORIF SHOULDER FRACTURE    . self orchectomy    . SHOULDER CLOSED REDUCTION Left 09/16/2013   Procedure: CLOSED REDUCTION SHOULDER;  Surgeon: Mauri Pole, MD;  Location: [facility];  Service: Orthopedics;  Laterality: Left;  . SHOULDER HEMI-ARTHROPLASTY Left 09/18/2013   Procedure: LEFT SHOULDER HEMI-ARTHROPLASTY;  Surgeon: Augustin Schooling, MD;  Location: [facility];  Service: Orthopedics;  Laterality: Left;       Home Medications  Prior to Admission medications   Medication Sig Start Date End Date Taking? Authorizing Provider  amitriptyline (ELAVIL) 100 MG tablet Take 2 tablets (200 mg total) by mouth at bedtime. 11/21/16  Yes Norman Clay, MD  aspirin EC 81 MG tablet Take 1 tablet (81 mg total) by mouth daily. 07/21/15  Yes Benjamine Mola, FNP  clonazePAM (KLONOPIN) 1 MG tablet Take 1 tablet (1 mg total) by mouth 3 (three) times daily.  11/21/16  Yes Norman Clay, MD  [employer] SENNA PLUS 8.6-50 MG tablet TAKE 1 TABLET BY MOUTH AT BEDTIME AS NEEDED FOR MILD CONSTIPATION 08/29/16  Yes Leeanne Rio, MD  hydrochlorothiazide (HYDRODIURIL) 12.5 MG tablet TAKE 1 TABLET (12.5 MG TOTAL) BY MOUTH DAILY. 09/28/16  Yes Leeanne Rio, MD  metoprolol succinate (TOPROL-XL) 50 MG 24 hr tablet TAKE 1 TABLET BY MOUTH AT BEDTIME WITH OR IMMEDIATELY FOLLOWING A MEAL 06/22/16  Yes Leeanne Rio, MD  Multiple Vitamin (MULTIVITAMIN WITH MINERALS) TABS tablet Take 1 tablet by mouth daily.   Yes Historical Provider, MD  phenytoin (DILANTIN) 100 MG ER capsule TAKE 2 CAPSULES BY MOUTH AT NIGHT 04/26/16  Yes Kathrynn Ducking, MD  phenytoin (DILANTIN) 50 MG tablet TAKE 1 TABLET BY MOUTH AT NIGHT 04/26/16  Yes Kathrynn Ducking, MD  polyethylene glycol powder (GLYCOLAX/MIRALAX) powder TAKE 17 GRAMS 2 TIMES A DAY AS NEEDED FOR CONSTIPATION. MIX INTO 8 OF FLUID AND DRINK 08/01/16  Yes Leeanne Rio, MD  QUEtiapine (SEROQUEL) 300 MG tablet Take 2 tablets (600 mg total) by mouth at bedtime. 11/21/16  Yes Norman Clay, MD  simvastatin (ZOCOR) 40 MG tablet TAKE 1 TABLET (40 MG TOTAL) BY MOUTH AT BEDTIME. 08/01/16  Yes Leeanne Rio, MD  testosterone cypionate (DEPOTESTOSTERONE CYPIONATE) 200 MG/ML injection Inject 0.75 mLs (150 mg total) into the muscle every 14 (fourteen) days. 09/12/16  Yes Leeanne Rio, MD  VIMPAT 150 MG TABS TAKE 1 TABLET BY MOUTH TWICE A DAY 09/28/16  Yes Kathrynn Ducking, MD  ALDARA 5 % cream Apply topically at bedtime. Patient not taking: Reported on 11/21/2016 06/11/16   Leeanne Rio, MD  gabapentin (NEURONTIN) 100 MG capsule Take 1 capsule (100 mg total) by mouth 4 (four) times daily. Patient not taking: Reported on 12/06/2016 11/21/16 11/21/17  Norman Clay, MD  Selenium Sulf-Pyrithione-Urea 2.25 % SHAM APPLY TO AFFECTED AREA ON SCALP 3 TO 6 TIMES PER WEEK Patient not taking: Reported on 11/21/2016 10/13/15   Alveda Reasons, MD  senna ([employer] SENNA) 8.6 MG tablet Take 1 tablet (8.6 mg total) by mouth daily as needed for constipation. Patient not taking: Reported on 12/06/2016 09/19/15   Leeanne Rio, MD    Family History Family History  Problem Relation Age of Onset  . Stroke Father     Living at 44  . Stroke Mother     Stroke in late 2's. Died in her 74's  . Alcohol abuse Brother   . Alcohol abuse Brother     Social History Social History  Substance Use Topics  . Smoking status: Never Smoker  . Smokeless tobacco: Never Used  . Alcohol use No     Allergies   Codeine and Sulfa antibiotics   Review of Systems Review of Systems  Constitutional: Positive for chills and fever. Negative for diaphoresis.  HENT: Negative for sore throat.   Respiratory: Positive for cough and wheezing.   Gastrointestinal: Negative for diarrhea, nausea and vomiting.  Genitourinary: Negative for difficulty  urinating.  Musculoskeletal: Positive for myalgias.  All other systems reviewed and are negative.  Physical Exam Updated Vital Signs BP 139/76 (BP Location: Left Arm)   Pulse 119   Temp 102.2 F (39 C) (Oral)   Resp 25   Ht 5\' 11"  (1.803 m)   Wt 200 lb (90.7 kg)   SpO2 93%   BMI 27.89 kg/m   Physical Exam  Constitutional: He is oriented to person, place, and time. He appears well-developed and well-nourished.  Tachypnic  HENT:  Head: Normocephalic and atraumatic.  Eyes: EOM are normal. Pupils are equal, round, and reactive to light.  Neck: Normal range of motion. Neck supple. No JVD present.  Cardiovascular: Normal rate, regular rhythm and normal heart sounds.   No murmur heard. Pulmonary/Chest: Effort normal. He has wheezes. He has no rales. He exhibits no tenderness.  Mild expiratory wheeze, right greater than left  Abdominal: Soft. Bowel sounds are normal. He exhibits no distension and no mass. There is no tenderness.  Musculoskeletal: Normal range of motion. He exhibits no edema.    Lymphadenopathy:    He has no cervical adenopathy.  Neurological: He is alert and oriented to person, place, and time. No cranial nerve deficit. He exhibits normal muscle tone. Coordination normal.  Skin: Skin is warm and dry. No rash noted.  Psychiatric: He has a normal mood and affect. His behavior is normal. Judgment and thought content normal.  Nursing note and vitals reviewed.   ED Treatments / Results  DIAGNOSTIC STUDIES: Oxygen Saturation is 93% on room air, normal by my interpretation.    COORDINATION OF CARE: 3:22 AM Reviewed radiology results and discussed treatment plan with pt at bedside including ordering laboratory studies, additional breathing treatment, fluids and mediations. Pt agreed to plan.  Labs (all labs ordered are listed, but only abnormal results are displayed) Labs Reviewed  COMPREHENSIVE METABOLIC PANEL - Abnormal; Notable for the following:       Result Value   Sodium 133 (*)    Chloride 99 (*)    Glucose, Bld 184 (*)    Calcium 8.5 (*)    All other components within normal limits  CBC WITH DIFFERENTIAL/PLATELET - Abnormal; Notable for the following:    WBC 13.7 (*)    Neutro Abs 11.1 (*)    Monocytes Absolute 1.1 (*)    All other components within normal limits  URINALYSIS, ROUTINE W REFLEX MICROSCOPIC - Abnormal; Notable for the following:    APPearance HAZY (*)    Glucose, UA >=500 (*)    Leukocytes, UA MODERATE (*)    Bacteria, UA FEW (*)    Squamous Epithelial / LPF 0-5 (*)    All other components within normal limits  I-STAT CG4 LACTIC ACID, ED - Abnormal; Notable for the following:    Lactic Acid, Venous 2.61 (*)    All other components within normal limits  CULTURE, BLOOD (ROUTINE X 2)  CULTURE, BLOOD (ROUTINE X 2)  URINE CULTURE    EKG  EKG Interpretation  Date/Time:  Thursday December 06 2016 02:43:11 EST Ventricular Rate:  112 PR Interval:    QRS Duration: 96 QT Interval:  314 QTC Calculation: 429 R Axis:   -54 Text  Interpretation:  Sinus tachycardia Left anterior fascicular block Abnormal R-wave progression, late transition When compared with ECG of 07/29/2015, HEART RATE has increased Confirmed by Roxanne Mins  MD, Dianne Whelchel ([id number]) on 12/06/2016 2:59:10 AM       Radiology No results found.  Procedures Procedures (including  critical care time) CRITICAL CARE Performed by: [id number] Total critical care time: 40 minutes Critical care time was exclusive of separately billable procedures and treating other patients. Critical care was necessary to treat or prevent imminent or life-threatening deterioration. Critical care was time spent personally by me on the following activities: development of treatment plan with patient and/or surrogate as well as nursing, discussions with consultants, evaluation of patient's response to treatment, examination of patient, obtaining history from patient or surrogate, ordering and performing treatments and interventions, ordering and review of laboratory studies, ordering and review of radiographic studies, pulse oximetry and re-evaluation of patient's condition.  Medications Ordered in ED Medications  ipratropium-albuterol (DUONEB) 0.5-2.5 (3) MG/3ML nebulizer solution 3 mL (not administered)  ibuprofen (ADVIL,MOTRIN) tablet 400 mg (not administered)  sodium chloride 0.9 % bolus 1,000 mL (not administered)    And  sodium chloride 0.9 % bolus 1,000 mL (not administered)    And  sodium chloride 0.9 % bolus 1,000 mL (not administered)  cefTRIAXone (ROCEPHIN) 1 g in dextrose 5 % 50 mL IVPB (not administered)  azithromycin (ZITHROMAX) 500 mg in dextrose 5 % 250 mL IVPB (not administered)  methylPREDNISolone sodium succinate (SOLU-MEDROL) 125 mg/2 mL injection 125 mg (not administered)  albuterol (PROVENTIL) (2.5 MG/3ML) 0.083% nebulizer solution 5 mg (5 mg Nebulization Given 12/06/16 0235)     Initial Impression / Assessment and Plan / ED Course  I have reviewed the triage  vital signs and the nursing notes.  Pertinent labs & imaging results that were available during my care of the patient were reviewed by me and considered in my medical decision making (see chart for details).  Clinical Course    Flulike illness with cough, fever, aching. However, he is febrile and tachycardic which is concerning for possible sepsis. He is started on sepsis protocol and started on antibiotics for possible pneumonia. He did have some bronchospasm and was given albuterol with ipratropium with significant improvement. Chest x-ray is read by radiologist as showing no pneumonia. Lactic acid level is mildly elevated at 2.63. Following IV fluids, he had persistent tachycardia and persistent tachypnea. Case is discussed with Dr. Hal Hope of triad hospitalists who agrees to admit the patient under observation status.  Final Clinical Impressions(s) / ED Diagnoses   Final diagnoses:  SIRS (systemic inflammatory response syndrome) (HCC)  Fever, unspecified fever cause  Cough  Flu-like symptoms    New Prescriptions New Prescriptions   No medications on file   I personally performed the services described in this documentation, which was scribed in my presence. The recorded information has been reviewed and is accurate.      [name] Fuel, MD [id number] [phone]

## 2016-12-06 NOTE — Progress Notes (Signed)
PHARMACY NOTE -  Rocephin/Zithromax  Pharmacy has been assisting with dosing of Rocephin 1gm IV q24h & Zithromax 500mg  IV q24h for CAP. Need for further dosage adjustment appears unlikely at present.    Will sign off at this time.  Please reconsult if a change in clinical status warrants re-evaluation of dosage.  Change to PO once clinically appropriate.   Netta Cedars, PharmD, BCPS Pager: [phone] 12/06/2016@11 :03 AM

## 2016-12-06 NOTE — Progress Notes (Signed)
Pharmacy Antibiotic Note  [name] is a 60 y.o. male admitted on 12/06/2016 with pneumonia.  Pharmacy has been consulted for Azithromycin, Ceftriaxone dosing.  Plan: Ceftriaxone 1gm iv q24hr   Azithromycin 500mg  iv q24hr  Height: 5\' 11"  (180.3 cm) Weight: 200 lb (90.7 kg) IBW/kg (Calculated) : 75.3  Temp (24hrs), Avg:101 F (38.3 C), Min:99.7 F (37.6 C), Max:102.2 F (39 C)   Recent Labs Lab 12/06/16 0328 12/06/16 0330  WBC 13.7*  --   CREATININE 1.07  --   LATICACIDVEN  --  2.61*    Estimated Creatinine Clearance: 82.5 mL/min (by C-G formula based on SCr of 1.07 mg/dL).    Allergies  Allergen Reactions  . Codeine Nausea And Vomiting  . Sulfa Antibiotics Nausea And Vomiting    Antimicrobials this admission: Ceftriaxone 12/06/2016  >> Azithromycin 12/06/2016  >>  Dose adjustments this admission: -  Microbiology results: pending  Thank you for allowing pharmacy to be a part of this patient's care.  Nani Skillern Crowford 12/06/2016 4:20 AM

## 2016-12-07 ENCOUNTER — Inpatient Hospital Stay (HOSPITAL_COMMUNITY): Payer: Medicaid Other

## 2016-12-07 DIAGNOSIS — A419 Sepsis, unspecified organism: Principal | ICD-10-CM

## 2016-12-07 DIAGNOSIS — J9601 Acute respiratory failure with hypoxia: Secondary | ICD-10-CM

## 2016-12-07 DIAGNOSIS — F322 Major depressive disorder, single episode, severe without psychotic features: Secondary | ICD-10-CM

## 2016-12-07 DIAGNOSIS — J189 Pneumonia, unspecified organism: Secondary | ICD-10-CM

## 2016-12-07 LAB — BASIC METABOLIC PANEL
Anion gap: 6 (ref 5–15)
BUN: 10 mg/dL (ref 6–20)
CO2: 24 mmol/L (ref 22–32)
Calcium: 7.7 mg/dL — ABNORMAL LOW (ref 8.9–10.3)
Chloride: 107 mmol/L (ref 101–111)
Creatinine, Ser: 0.98 mg/dL (ref 0.61–1.24)
GFR calc Af Amer: >60 mL/min (ref >60)
GFR calc non Af Amer: >60 mL/min (ref >60)
Glucose, Bld: 115 mg/dL — ABNORMAL HIGH (ref 65–99)
Potassium: 4.2 mmol/L (ref 3.5–5.1)
Sodium: 137 mmol/L (ref 135–145)

## 2016-12-07 LAB — CBC
HCT: 39.9 % (ref 39.0–52.0)
Hemoglobin: 14.3 g/dL (ref 13.0–17.0)
MCH: 31.2 pg (ref 26.0–34.0)
MCHC: 35.8 g/dL (ref 30.0–36.0)
MCV: 87.1 fL (ref 78.0–100.0)
Platelets: 155 10*3/uL (ref 150–400)
RBC: 4.58 MIL/uL (ref 4.22–5.81)
RDW: 15.9 % — ABNORMAL HIGH (ref 11.5–15.5)
WBC: 5.1 10*3/uL (ref 4.0–10.5)

## 2016-12-07 LAB — EXPECTORATED SPUTUM ASSESSMENT W GRAM STAIN, RFLX TO RESP C

## 2016-12-07 MED ORDER — HYDROCHLOROTHIAZIDE 12.5 MG PO CAPS
12.5000 mg | ORAL_CAPSULE | Freq: Every day | ORAL | Status: DC
  Administered 2016-12-07 – 2016-12-09 (×3): 12.5 mg via ORAL
  Filled 2016-12-07 (×3): qty 1

## 2016-12-07 MED ORDER — BENZONATATE 100 MG PO CAPS
200.0000 mg | ORAL_CAPSULE | Freq: Three times a day (TID) | ORAL | Status: DC | PRN
  Administered 2016-12-07 – 2016-12-09 (×3): 200 mg via ORAL
  Filled 2016-12-07 (×3): qty 2

## 2016-12-07 MED ORDER — HYDROCOD POLST-CPM POLST ER 10-8 MG/5ML PO SUER
5.0000 mL | Freq: Two times a day (BID) | ORAL | Status: DC | PRN
  Administered 2016-12-07 – 2016-12-09 (×4): 5 mL via ORAL
  Filled 2016-12-07 (×5): qty 5

## 2016-12-07 MED ORDER — METOPROLOL SUCCINATE ER 50 MG PO TB24
50.0000 mg | ORAL_TABLET | Freq: Every day | ORAL | Status: DC
  Administered 2016-12-07 – 2016-12-09 (×3): 50 mg via ORAL
  Filled 2016-12-07 (×3): qty 1

## 2016-12-07 NOTE — Consult Note (Signed)
Patient is concerned about his wheezing. Patient stated that he has been wheezing for months. Would like to talk to MD.

## 2016-12-07 NOTE — Telephone Encounter (Signed)
Pt informed. Kathan Kirker, CMA  

## 2016-12-07 NOTE — Evaluation (Signed)
Physical Therapy Evaluation Patient Details Name: [name] MRN: [id number] DOB: [birth year] Today's Date: 12/07/2016   History of Present Illness   60 y.o. male with past medical history significant for anxiety, depression, high cholesterol, reflux, memory loss, MRSA and severe schizoaffective disorder presented to the hospital with complaint of weakness.  Pt admitted for sepsis likely due to respiratory origin.  Clinical Impression  Pt admitted with above diagnosis. Pt currently with functional limitations due to the deficits listed below (see PT Problem List).  Pt will benefit from skilled PT to increase their independence and safety with mobility to allow discharge to the venue listed below.  Pt reports generalized weakness and assisted with ambulation however only tolerated 16 feet with RW.  Pt reports he only has girlfriend as social support and he believes she is ill at this time as well.  Recommend SNF upon d/c.     Follow Up Recommendations SNF    Equipment Recommendations  Rolling walker with 5" wheels    Recommendations for Other Services       Precautions / Restrictions Precautions Precautions: Fall Precaution Comments: monitor sats      Mobility  Bed Mobility Overal bed mobility: Needs Assistance Bed Mobility: Supine to Sit     Supine to sit: Mod assist     General bed mobility comments: assist for raising trunk  Transfers Overall transfer level: Needs assistance Equipment used: Rolling walker (2 wheeled) Transfers: Sit to/from Stand Sit to Stand: Min assist         General transfer comment: verbal cues for technique  Ambulation/Gait Ambulation/Gait assistance: Min assist Ambulation Distance (Feet): 16 Feet Assistive device: Rolling walker (2 wheeled) Gait Pattern/deviations: Step-through pattern;Decreased stride length;Narrow base of support     General Gait Details: pt with very unsteady gait and difficulty maneuvering RW, verbal cues for safe  use of RW, SpO2 94% on room air however reapplied O2 [place] upon return to room (RN notified); distance to pt tolerance  Stairs            Wheelchair Mobility    Modified Rankin (Stroke Patients Only)       Balance Overall balance assessment: History of Falls (pt reports a fall prior to arrival)                                           Pertinent Vitals/Pain Pain Assessment: No/denies pain    Home Living Family/patient expects to be discharged to:: Private residence Living Arrangements: Alone   Type of Home: Apartment       Home Layout: One level Home Equipment: None      Prior Function Level of Independence: Independent               Hand Dominance        Extremity/Trunk Assessment        Lower Extremity Assessment Lower Extremity Assessment: Generalized weakness       Communication   Communication: No difficulties  Cognition Arousal/Alertness: Awake/alert Behavior During Therapy: WFL for tasks assessed/performed Overall Cognitive Status: Within Functional Limits for tasks assessed                 General Comments: hx of memory loss    General Comments      Exercises     Assessment/Plan    PT Assessment Patient needs continued PT services  PT Problem  List Decreased strength;Decreased activity tolerance;Decreased knowledge of use of DME;Decreased mobility          PT Treatment Interventions DME instruction;Gait training;Functional mobility training;Therapeutic exercise;Therapeutic activities;Patient/family education    PT Goals (Current goals can be found in the Care Plan section)  Acute Rehab PT Goals PT Goal Formulation: With patient Time For Goal Achievement: 12/21/16 Potential to Achieve Goals: Good    Frequency Min 3X/week   Barriers to discharge        Co-evaluation               End of Session Equipment Utilized During Treatment: Gait belt Activity Tolerance: Patient limited by  fatigue Patient left: in bed;with call bell/phone within reach;with bed alarm set           Time: [id number] PT Time Calculation (min) (ACUTE ONLY): 16 min   Charges:   PT Evaluation $PT Eval Low Complexity: 1 Procedure     PT G Codes:        Shilee Biggs,KATHrine E 12/07/2016, 12:18 PM Carmelia Bake, PT, DPT 12/07/2016 Pager: [phone]

## 2016-12-07 NOTE — Progress Notes (Signed)
CSW reviewed PT evaluation recommending SNF - patient declined SNF placement, stating that he would prefer to return home to his cat. RNCM, Vinnie Level made aware.   No further CSW needs identified - CSW signing off.   [name], [facility] Clinical Social Worker cell #: [phone]

## 2016-12-07 NOTE — Progress Notes (Signed)
PROGRESS NOTE  [name]  [id number] DOB: 1956/11/07 DOA: 12/06/2016 PCP: Chrisandra Netters, MD   Brief Narrative: [name] is a 60 y.o. male with a past medical history significant for anxiety, depression, HLD, and GERD who presented to the hospital 12/21 with complaint of weakness. He reported 3 days of body aches, weakness, productive cough and fever. In the ED, he was given 3L NS, ceftriaxone and azithromycin for presumed pneumonia, and IV steroids with bronchodilators which improved dyspnea, though oxygen requirement remained. Hospitalist was consulted for admission for acute respiratory failure.   Assessment & Plan: Principal Problem:   Sepsis ([place]) Active Problems:   Hyperlipidemia   Seizure disorder (HCC)   Constipation, chronic   Schizoaffective disorder (HCC)   Severe major depression (HCC)   Essential hypertension   Falls   Mild sleep apnea  Sepsis and acute respiratory failure due to pneumonia: Suspect viral +/- bacterial/atypical etiology. CXR without focal consolidation. Strep Ag neg. - Check RVP, flu negative - Increase duoneb to q8h w/q2h prn albuterol.  - Continue ceftriaxone/azithromycin - Continue oxygen prn SpO2 < 90% - Monitor blood cultures - Tussionex and tessalon prn cough - Monitor sputum culture  Hypertension: Chronic, stable - Restart hydrochlorothiazide and Toprol-XL  Schizoaffective/depression: Chronic, stable. 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  - Continue Elavil, Klonopin, Seroquel - No indication for psychiatry consult - Suspect deconditioning due to chronic neurovegetative state: will get PT consult  Seizure disorder: Chronic, stable. Phenytoin level subtherapeutic on admission (has h/o toxicity) - Sz precautions, ativan prn seizure - Continue Vimpat, Dilantin - Suggest recollection of phenytoin level as outpatient. Very reluctant to change dose given h/o  toxicity.  Androgen deficiency: Managed by urology - Testosterone biweekly, not administered during admission  Constipation: Chronic, stable - Continue miralax  Hyperlipidemia: Chronic, stable  - Continue statin - Last LDL in 2016. Recommend recheck as outpatient.  Sleep apnea: mild - RT consult  DVT prophylaxis: Lovenox Code Status: Full Family Communication: None at bedside this AM Disposition Plan: Inpatient management of acute respiratory failure requiring frequent monitoring and breathing treatments in addition to IV therapies.   Consultants:   None  Procedures:   None  Antimicrobials:  Ceftriaxone 12/21   Azithromycin 12/21   Subjective: Pt with ongoing dyspnea, obvious wheezing. Does not wheeze at baseline. Has far left lateral chest pain only when coughing. Bringing nothing up.   Objective: Vitals:   12/07/16 0115 12/07/16 0413 12/07/16 0841 12/07/16 1435  BP:  119/68  (!) 150/86  Pulse:  97  95  Resp:  18  19  Temp: 98.4 F (36.9 C) 97.7 F (36.5 C)  98.7 F (37.1 C)  TempSrc: Oral Axillary  Oral  SpO2: 93% 96% 99% 97%  Weight:      Height:        Intake/Output Summary (Last 24 hours) at 12/07/16 1641 Last data filed at 12/07/16 1500  Gross per 24 hour  Intake             2960 ml  Output             2825 ml  Net              135 ml   Filed Weights   12/06/16 0220  Weight: 90.7 kg (200 lb)    Examination: General exam: 60 y.o. ill-appearing male in no distress Respiratory system: Tachypneic with prolonged expiration and wheezing throughout respiratory  cycle in all fields. No crackles Cardiovascular system: Regular rate and rhythm. No murmur, rub, or gallop. No JVD, and no pedal edema. Gastrointestinal system: Abdomen soft, non-tender, non-distended, with normoactive bowel sounds. No organomegaly or masses felt. Central nervous system: Alert and oriented. No focal neurological deficits. Extremities: Warm, no deformities Skin: No  rashes, lesions no ulcers Psychiatry: Judgement and insight appear normal. Mood & affect appropriate.   Data Reviewed: I have personally reviewed following labs and imaging studies  CBC:  Recent Labs Lab 12/06/16 0328 12/06/16 1036 12/07/16 0835  WBC 13.7* 11.1* 5.1  NEUTROABS 11.1*  --   --   HGB 15.8 15.1 14.3  HCT 44.2 42.9 39.9  MCV 87.4 87.4 87.1  PLT 185 169 [id number]   Basic Metabolic Panel:  Recent Labs Lab 12/06/16 0328 12/06/16 1036 12/07/16 0835  NA 133*  --  137  K 3.7  --  4.2  CL 99*  --  107  CO2 23  --  24  GLUCOSE 184*  --  115*  BUN 17  --  10  CREATININE 1.07 0.98 0.98  CALCIUM 8.5*  --  7.7*   GFR: Estimated Creatinine Clearance: 90.1 mL/min (by C-G formula based on SCr of 0.98 mg/dL). Liver Function Tests:  Recent Labs Lab 12/06/16 0328  AST 24  ALT 20  ALKPHOS 79  BILITOT 0.5  PROT 7.0  ALBUMIN 3.9   No results for input(s): LIPASE, AMYLASE in the last 168 hours. No results for input(s): AMMONIA in the last 168 hours. Coagulation Profile: No results for input(s): INR, PROTIME in the last 168 hours. Cardiac Enzymes: No results for input(s): CKTOTAL, CKMB, CKMBINDEX, TROPONINI in the last 168 hours. BNP (last 3 results) No results for input(s): PROBNP in the last 8760 hours. HbA1C: No results for input(s): HGBA1C in the last 72 hours. CBG: No results for input(s): GLUCAP in the last 168 hours. Lipid Profile: No results for input(s): CHOL, HDL, LDLCALC, TRIG, CHOLHDL, LDLDIRECT in the last 72 hours. Thyroid Function Tests: No results for input(s): TSH, T4TOTAL, FREET4, T3FREE, THYROIDAB in the last 72 hours. Anemia Panel: No results for input(s): VITAMINB12, FOLATE, FERRITIN, TIBC, IRON, RETICCTPCT in the last 72 hours. Urine analysis:    Component Value Date/Time   COLORURINE YELLOW 12/06/2016 0339   APPEARANCEUR HAZY (A) 12/06/2016 0339   LABSPEC 1.011 12/06/2016 0339   PHURINE 5.0 12/06/2016 0339   GLUCOSEU >=500 (A) 12/06/2016  0339   HGBUR NEGATIVE 12/06/2016 0339   HGBUR negative 07/18/2010 1436   BILIRUBINUR NEGATIVE 12/06/2016 0339   BILIRUBINUR NEG 01/17/2016 1446   KETONESUR NEGATIVE 12/06/2016 0339   PROTEINUR NEGATIVE 12/06/2016 0339   UROBILINOGEN 0.2 01/17/2016 1446   UROBILINOGEN 0.2 07/29/2015 1708   NITRITE NEGATIVE 12/06/2016 0339   LEUKOCYTESUR MODERATE (A) 12/06/2016 0339   Sepsis Labs: @LABRCNTIP (procalcitonin:4,lacticidven:4)  ) Recent Results (from the past 240 hour(s))  Blood Culture (routine x 2)     Status: None (Preliminary result)   Collection Time: 12/06/16  2:30 AM  Result Value Ref Range Status   Specimen Description BLOOD RIGHT HAND  Final   Special Requests BOTTLES DRAWN AEROBIC AND ANAEROBIC 5CC  Final   Culture   Final    NO GROWTH 1 DAY Performed at [facility]    Report Status PENDING  Incomplete  Blood Culture (routine x 2)     Status: None (Preliminary result)   Collection Time: 12/06/16  2:50 AM  Result Value Ref Range Status  Specimen Description BLOOD BLOOD LEFT FOREARM  Final   Special Requests BOTTLES DRAWN AEROBIC AND ANAEROBIC 5ML  Final   Culture   Final    NO GROWTH 1 DAY Performed at [facility]    Report Status PENDING  Incomplete  Urine culture     Status: None (Preliminary result)   Collection Time: 12/06/16  3:39 AM  Result Value Ref Range Status   Specimen Description URINE, CLEAN CATCH  Final   Special Requests NONE  Final   Culture   Final    CULTURE REINCUBATED FOR BETTER GROWTH Performed at [facility]    Report Status PENDING  Incomplete  Culture, sputum-assessment     Status: None   Collection Time: 12/07/16  1:04 PM  Result Value Ref Range Status   Specimen Description SPUTUM  Final   Special Requests NONE  Final   Sputum evaluation THIS SPECIMEN IS ACCEPTABLE FOR SPUTUM CULTURE  Final   Report Status 12/07/2016 FINAL  Final     Radiology Studies: Dg Chest 2 View  Result Date: 12/07/2016 CLINICAL  DATA:  The sepsis. Body aches and weakness with fever, vomiting, and cough beginning 3 days ago. Stabbing chest pain with cough. EXAM: CHEST  2 VIEW COMPARISON:  Two-view chest x-ray 12/06/2016. FINDINGS: Heart size normal. Lung volumes are low. Bibasilar airspace disease is new, likely reflecting atelectasis. Mild interstitial coarsening is otherwise stable. No definite edema or effusions are present. IMPRESSION: 1. Decreasing lung volumes. 2. Mild bibasilar airspace disease likely reflects atelectasis. Infection is not excluded. Electronically Signed   By: San Morelle M.D.   On: 12/07/2016 08:40   Dg Chest 2 View  Result Date: 12/06/2016 CLINICAL DATA:  60 year old male with fever and wheezing. Shortness of breath. EXAM: CHEST  2 VIEW COMPARISON:  Chest radiograph dated 02/15/2015 FINDINGS: Shallow inspiration. No focal consolidation, pleural effusion, or pneumothorax. Minimal left lung base linear atelectasis/ scarring. The cardiac silhouette is within normal limits. No acute osseous pathology identified. Left shoulder arthroplasty and right humeral head fixation screws noted. IMPRESSION: No active cardiopulmonary disease. Electronically Signed   By: Anner Crete M.D.   On: 12/06/2016 03:29    Scheduled Meds: . amitriptyline  100 mg Oral Once  . amitriptyline  200 mg Oral QHS  . aspirin EC  81 mg Oral Daily  . azithromycin  500 mg Intravenous Q24H  . cefTRIAXone (ROCEPHIN)  IV  1 g Intravenous Q24H  . clonazePAM  1 mg Oral TID  . enoxaparin (LOVENOX) injection  40 mg Subcutaneous Q24H  . ipratropium-albuterol  3 mL Nebulization BID  . lacosamide  150 mg Oral BID  . phenytoin  50 mg Oral QHS  . phenytoin  200 mg Oral QHS  . psyllium  1 packet Oral Daily  . QUEtiapine  600 mg Oral QHS  . simvastatin  40 mg Oral q1800   Continuous Infusions: . sodium chloride 125 mL/hr at 12/07/16 0258     LOS: 1 day   Time spent: 25 minutes.  Vance Gather, MD [name] Pager  [phone]  If 7PM-7AM, please contact night-coverage [email or web address] Password [facility] 12/07/2016, 4:41 PM

## 2016-12-08 DIAGNOSIS — J208 Acute bronchitis due to other specified organisms: Secondary | ICD-10-CM

## 2016-12-08 DIAGNOSIS — F259 Schizoaffective disorder, unspecified: Secondary | ICD-10-CM

## 2016-12-08 LAB — CBC
HCT: 41.1 % (ref 39.0–52.0)
Hemoglobin: 13.9 g/dL (ref 13.0–17.0)
MCH: 29.9 pg (ref 26.0–34.0)
MCHC: 33.8 g/dL (ref 30.0–36.0)
MCV: 88.4 fL (ref 78.0–100.0)
Platelets: 160 10*3/uL (ref 150–400)
RBC: 4.65 MIL/uL (ref 4.22–5.81)
RDW: 16.1 % — ABNORMAL HIGH (ref 11.5–15.5)
WBC: 6 10*3/uL (ref 4.0–10.5)

## 2016-12-08 LAB — RESPIRATORY PANEL BY PCR

## 2016-12-08 LAB — BASIC METABOLIC PANEL
Anion gap: 7 (ref 5–15)
BUN: 11 mg/dL (ref 6–20)
CO2: 24 mmol/L (ref 22–32)
Calcium: 8.1 mg/dL — ABNORMAL LOW (ref 8.9–10.3)
Chloride: 109 mmol/L (ref 101–111)
Creatinine, Ser: 0.91 mg/dL (ref 0.61–1.24)
GFR calc Af Amer: >60 mL/min (ref >60)
GFR calc non Af Amer: >60 mL/min (ref >60)
Glucose, Bld: 90 mg/dL (ref 65–99)
Potassium: 4 mmol/L (ref 3.5–5.1)
Sodium: 140 mmol/L (ref 135–145)

## 2016-12-08 NOTE — Progress Notes (Signed)
PROGRESS NOTE  [name]  [id number] DOB: 10/15/1956 DOA: 12/06/2016 PCP: Chrisandra Netters, MD   Brief Narrative: [name] is a 60 y.o. male with a past medical history significant for anxiety, depression, HLD, and GERD who presented to the hospital 12/21 with complaint of weakness. He reported 3 days of body aches, weakness, productive cough and fever. In the ED, he was given 3L NS, ceftriaxone and azithromycin for presumed pneumonia, and IV steroids with bronchodilators which improved dyspnea, though oxygen requirement remained. Hospitalist was consulted for admission for acute respiratory failure.   Assessment & Plan: Principal Problem:   Sepsis ([place]) Active Problems:   Hyperlipidemia   Seizure disorder (HCC)   Constipation, chronic   Schizoaffective disorder (HCC)   Severe major depression (HCC)   Essential hypertension   Falls   Mild sleep apnea  Sepsis and acute respiratory failure due to pneumonia: Suspect viral (metapneumovirus) + bacterial/atypical etiology. CXR without focal consolidation. Strep Ag neg. - RVP +metapneumovirus, flu negative. Continue droplet precautions. - Increase duoneb to q8h w/q2h prn albuterol.  - Continue ceftriaxone/azithromycin - Continue oxygen prn SpO2 < 90%. Still with O2 requirement (no history of chronic resp failure): Wean as tolerated.  - Monitor blood cultures - Tussionex and tessalon prn cough - Monitor sputum culture - Pt reports several months of end-expiratory wheezing. While he has considerable anxiety, I would consider outpatient PFT's/pulm referral.   Hypertension: Chronic, stable. - Restart hydrochlorothiazide and Toprol-XL  Schizoaffective/depression: Chronic, stable. 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  - Continue Elavil, Klonopin, Seroquel - No indication for psychiatry consult  Weakness/lassitude: Related to chronic  neurovegetative state. - At time of PT consult, pt tolerated only 63ft w/RW. Today, after declining SNF, he is ambulating constantly in the halls with steady gait. Do not believe he was putting forth complete effort with PT.   Seizure disorder: Chronic, stable. Phenytoin level subtherapeutic on admission. - Sz precautions, ativan prn seizure - Continue Vimpat, Dilantin - Suggest recollection of phenytoin level as outpatient. Very reluctant to change dose given h/o toxicity.  Androgen deficiency: Managed by urology - Testosterone biweekly, not administered during admission  Constipation: Chronic, stable - Continue miralax  Hyperlipidemia: Chronic, stable  - Continue statin - Last LDL in 2016. Recommend recheck as outpatient.  Sleep apnea: mild - RT consult  Bacteruria: Asymptomatic, culture showed 40k colonies of GNRs (speciation pending) - No intent to treat (receiving CTX for PNA anyway)  DVT prophylaxis: Lovenox Code Status: Full Family Communication: None at bedside this AM Disposition Plan: Inpatient management of acute respiratory failure requiring frequent monitoring and breathing treatments in addition to IV therapies.   Consultants:   None  Procedures:   None  Antimicrobials:  Ceftriaxone 12/21   Azithromycin 12/21   Subjective: Pt with ongoing wheezing only minimally improved with breathing treatments if at all. Pleuritic pain improving. Actually denies dyspnea. No chest pain.   Objective: Vitals:   12/07/16 2017 12/08/16 0558 12/08/16 0804 12/08/16 1500  BP: (!) 144/82 119/74  122/67  Pulse: (!) 109 77  72  Resp: 20 20  20   Temp: 98.9 F (37.2 C) 98 F (36.7 C)  98.9 F (37.2 C)  TempSrc: Oral Oral  Oral  SpO2: 95% 99% 93% 96%  Weight:      Height:        Intake/Output Summary (Last 24 hours) at 12/08/16 1516 Last data filed at 12/08/16  1357  Gross per 24 hour  Intake             4025 ml  Output             3275 ml  Net              750  ml   Filed Weights   12/06/16 0220  Weight: 90.7 kg (200 lb)    Examination: General exam: 61 y.o. ill-appearing male in no distress Respiratory system: Normal rate, nonlabored with prolonged expiration and wheezing and rhonchi throughout respiratory cycle in all fields. No crackles Cardiovascular system: Regular rate and rhythm. No murmur, rub, or gallop. No JVD, and no pedal edema. Gastrointestinal system: Abdomen soft, non-tender, non-distended, with normoactive bowel sounds. No organomegaly or masses felt. Central nervous system: Alert and oriented. No focal neurological deficits. Extremities: Warm, no deformities Skin: Scattered scars s/p cutting on right forearm. No rash Psychiatry: Judgement and insight appear normal. Mood & affect appropriate.   Data Reviewed: I have personally reviewed following labs and imaging studies  CBC:  Recent Labs Lab 12/06/16 0328 12/06/16 1036 12/07/16 0835 12/08/16 0613  WBC 13.7* 11.1* 5.1 6.0  NEUTROABS 11.1*  --   --   --   HGB 15.8 15.1 14.3 13.9  HCT 44.2 42.9 39.9 41.1  MCV 87.4 87.4 87.1 88.4  PLT 185 169 155 0000000   Basic Metabolic Panel:  Recent Labs Lab 12/06/16 0328 12/06/16 1036 12/07/16 0835 12/08/16 0613  NA 133*  --  137 140  K 3.7  --  4.2 4.0  CL 99*  --  107 109  CO2 23  --  24 24  GLUCOSE 184*  --  115* 90  BUN 17  --  10 11  CREATININE 1.07 0.98 0.98 0.91  CALCIUM 8.5*  --  7.7* 8.1*   GFR: Estimated Creatinine Clearance: 97 mL/min (by C-G formula based on SCr of 0.91 mg/dL). Liver Function Tests:  Recent Labs Lab 12/06/16 0328  AST 24  ALT 20  ALKPHOS 79  BILITOT 0.5  PROT 7.0  ALBUMIN 3.9   No results for input(s): LIPASE, AMYLASE in the last 168 hours. No results for input(s): AMMONIA in the last 168 hours. Coagulation Profile: No results for input(s): INR, PROTIME in the last 168 hours. Cardiac Enzymes: No results for input(s): CKTOTAL, CKMB, CKMBINDEX, TROPONINI in the last 168  hours. BNP (last 3 results) No results for input(s): PROBNP in the last 8760 hours. HbA1C: No results for input(s): HGBA1C in the last 72 hours. CBG: No results for input(s): GLUCAP in the last 168 hours. Lipid Profile: No results for input(s): CHOL, HDL, LDLCALC, TRIG, CHOLHDL, LDLDIRECT in the last 72 hours. Thyroid Function Tests: No results for input(s): TSH, T4TOTAL, FREET4, T3FREE, THYROIDAB in the last 72 hours. Anemia Panel: No results for input(s): VITAMINB12, FOLATE, FERRITIN, TIBC, IRON, RETICCTPCT in the last 72 hours. Urine analysis:    Component Value Date/Time   COLORURINE YELLOW 12/06/2016 0339   APPEARANCEUR HAZY (A) 12/06/2016 0339   LABSPEC 1.011 12/06/2016 0339   PHURINE 5.0 12/06/2016 0339   GLUCOSEU >=500 (A) 12/06/2016 0339   HGBUR NEGATIVE 12/06/2016 0339   HGBUR negative 07/18/2010 1436   BILIRUBINUR NEGATIVE 12/06/2016 0339   BILIRUBINUR NEG 01/17/2016 1446   KETONESUR NEGATIVE 12/06/2016 0339   PROTEINUR NEGATIVE 12/06/2016 0339   UROBILINOGEN 0.2 01/17/2016 1446   UROBILINOGEN 0.2 07/29/2015 1708   NITRITE NEGATIVE 12/06/2016 0339   LEUKOCYTESUR MODERATE (A) 12/06/2016  0339   Sepsis Labs: @LABRCNTIP (procalcitonin:4,lacticidven:4)  ) Recent Results (from the past 240 hour(s))  Blood Culture (routine x 2)     Status: None (Preliminary result)   Collection Time: 12/06/16  2:30 AM  Result Value Ref Range Status   Specimen Description BLOOD RIGHT HAND  Final   Special Requests BOTTLES DRAWN AEROBIC AND ANAEROBIC 5CC  Final   Culture   Final    NO GROWTH 2 DAYS Performed at [facility]    Report Status PENDING  Incomplete  Blood Culture (routine x 2)     Status: None (Preliminary result)   Collection Time: 12/06/16  2:50 AM  Result Value Ref Range Status   Specimen Description BLOOD BLOOD LEFT FOREARM  Final   Special Requests BOTTLES DRAWN AEROBIC AND ANAEROBIC 5ML  Final   Culture   Final    NO GROWTH 2 DAYS Performed at [facility]    Report Status PENDING  Incomplete  Urine culture     Status: Abnormal (Preliminary result)   Collection Time: 12/06/16  3:39 AM  Result Value Ref Range Status   Specimen Description URINE, CLEAN CATCH  Final   Special Requests NONE  Final   Culture 40,000 COLONIES/mL GRAM NEGATIVE RODS (A)  Final   Report Status PENDING  Incomplete  Respiratory Panel by PCR     Status: Abnormal   Collection Time: 12/07/16  1:00 PM  Result Value Ref Range Status   Adenovirus NOT DETECTED NOT DETECTED Final   Coronavirus 229E NOT DETECTED NOT DETECTED Final   Coronavirus HKU1 NOT DETECTED NOT DETECTED Final   Coronavirus NL63 NOT DETECTED NOT DETECTED Final   Coronavirus OC43 NOT DETECTED NOT DETECTED Final   Metapneumovirus DETECTED (A) NOT DETECTED Final   Rhinovirus / Enterovirus NOT DETECTED NOT DETECTED Final   Influenza A NOT DETECTED NOT DETECTED Final   Influenza B NOT DETECTED NOT DETECTED Final   Parainfluenza Virus 1 NOT DETECTED NOT DETECTED Final   Parainfluenza Virus 2 NOT DETECTED NOT DETECTED Final   Parainfluenza Virus 3 NOT DETECTED NOT DETECTED Final   Parainfluenza Virus 4 NOT DETECTED NOT DETECTED Final   Respiratory Syncytial Virus NOT DETECTED NOT DETECTED Final   Bordetella pertussis NOT DETECTED NOT DETECTED Final   Chlamydophila pneumoniae NOT DETECTED NOT DETECTED Final   Mycoplasma pneumoniae NOT DETECTED NOT DETECTED Final    Comment: Performed at [facility]  Culture, sputum-assessment     Status: None   Collection Time: 12/07/16  1:04 PM  Result Value Ref Range Status   Specimen Description SPUTUM  Final   Special Requests NONE  Final   Sputum evaluation THIS SPECIMEN IS ACCEPTABLE FOR SPUTUM CULTURE  Final   Report Status 12/07/2016 FINAL  Final  Culture, respiratory (NON-Expectorated)     Status: None (Preliminary result)   Collection Time: 12/07/16  1:04 PM  Result Value Ref Range Status   Specimen Description SPUTUM  Final   Special  Requests NONE Reflexed from [id number]  Final   Gram Stain   Final    MODERATE WBC PRESENT, PREDOMINANTLY PMN NO ORGANISMS SEEN    Culture   Final    NO GROWTH < 24 HOURS Performed at [facility]    Report Status PENDING  Incomplete     Radiology Studies: Dg Chest 2 View  Result Date: 12/07/2016 CLINICAL DATA:  The sepsis. Body aches and weakness with fever, vomiting, and cough beginning 3 days ago. Stabbing chest pain  with cough. EXAM: CHEST  2 VIEW COMPARISON:  Two-view chest x-ray 12/06/2016. FINDINGS: Heart size normal. Lung volumes are low. Bibasilar airspace disease is new, likely reflecting atelectasis. Mild interstitial coarsening is otherwise stable. No definite edema or effusions are present. IMPRESSION: 1. Decreasing lung volumes. 2. Mild bibasilar airspace disease likely reflects atelectasis. Infection is not excluded. Electronically Signed   By: San Morelle M.D.   On: 12/07/2016 08:40    Scheduled Meds: . amitriptyline  100 mg Oral Once  . amitriptyline  200 mg Oral QHS  . aspirin EC  81 mg Oral Daily  . azithromycin  500 mg Intravenous Q24H  . cefTRIAXone (ROCEPHIN)  IV  1 g Intravenous Q24H  . clonazePAM  1 mg Oral TID  . enoxaparin (LOVENOX) injection  40 mg Subcutaneous Q24H  . hydrochlorothiazide  12.5 mg Oral Daily  . ipratropium-albuterol  3 mL Nebulization BID  . lacosamide  150 mg Oral BID  . metoprolol succinate  50 mg Oral Daily  . phenytoin  50 mg Oral QHS  . phenytoin  200 mg Oral QHS  . psyllium  1 packet Oral Daily  . QUEtiapine  600 mg Oral QHS  . simvastatin  40 mg Oral q1800   Continuous Infusions: . sodium chloride 125 mL/hr at 12/08/16 1324     LOS: 2 days   Time spent: 25 minutes.  Vance Gather, MD [name] Pager [phone]  If 7PM-7AM, please contact night-coverage [email or web address] Password [facility] 12/08/2016, 3:16 PM

## 2016-12-09 DIAGNOSIS — J211 Acute bronchiolitis due to human metapneumovirus: Secondary | ICD-10-CM

## 2016-12-09 DIAGNOSIS — R6510 Systemic inflammatory response syndrome (SIRS) of non-infectious origin without acute organ dysfunction: Secondary | ICD-10-CM

## 2016-12-09 LAB — URINE CULTURE: Culture: 40000 — AB

## 2016-12-09 LAB — CBC
HCT: 42.3 % (ref 39.0–52.0)
Hemoglobin: 14.7 g/dL (ref 13.0–17.0)
MCH: 30.5 pg (ref 26.0–34.0)
MCHC: 34.8 g/dL (ref 30.0–36.0)
MCV: 87.8 fL (ref 78.0–100.0)
Platelets: 180 10*3/uL (ref 150–400)
RBC: 4.82 MIL/uL (ref 4.22–5.81)
RDW: 15.5 % (ref 11.5–15.5)
WBC: 6.8 10*3/uL (ref 4.0–10.5)

## 2016-12-09 LAB — BASIC METABOLIC PANEL
Anion gap: 6 (ref 5–15)
BUN: 14 mg/dL (ref 6–20)
CO2: 26 mmol/L (ref 22–32)
Calcium: 8.4 mg/dL — ABNORMAL LOW (ref 8.9–10.3)
Chloride: 106 mmol/L (ref 101–111)
Creatinine, Ser: 0.89 mg/dL (ref 0.61–1.24)
GFR calc Af Amer: >60 mL/min (ref >60)
GFR calc non Af Amer: >60 mL/min (ref >60)
Glucose, Bld: 106 mg/dL — ABNORMAL HIGH (ref 65–99)
Potassium: 4.3 mmol/L (ref 3.5–5.1)
Sodium: 138 mmol/L (ref 135–145)

## 2016-12-09 LAB — CULTURE, RESPIRATORY W GRAM STAIN: Culture: NORMAL

## 2016-12-09 MED ORDER — HYDROCOD POLST-CPM POLST ER 10-8 MG/5ML PO SUER
5.0000 mL | Freq: Two times a day (BID) | ORAL | 0 refills | Status: DC | PRN
Start: 2016-12-09 — End: 2017-01-01

## 2016-12-09 MED ORDER — AZITHROMYCIN 250 MG PO TABS: 250.0000 mg | ORAL_TABLET | Freq: Once | ORAL | Status: DC

## 2016-12-09 NOTE — Progress Notes (Addendum)
Patient is alert and oriented x4, ambulatory. Discharge instructions reviewed. Questions, concerns denied. Taxi voucher provided estimate given was $36, $18 per trip. First is to [address] [facility] and second is to [address]  [phone]

## 2016-12-09 NOTE — Progress Notes (Signed)
Patient was witnessed in the hallway this am ambulating/running  viourously trying to increase heart rate and cause sob. Patient was encouraged to ambulate but not to cause sob. Pt encouraged to slow down and to take his time. Also spoke twith patietn regarding safety concerns. Will continue to monitor.

## 2016-12-09 NOTE — Progress Notes (Signed)
CSW was contacted for aid to transportation for patient via taxi cab. CSW met with patient to confirm with travel plans and completed taxi cab voucher - provided to RN, Museum/gallery conservator for patient to give to taxi driver. CSW reported plans to nursing staff - RN to call for taxi when ready - Lilian Kapur (ph#: [phone]) & signing off.   Raynaldo Opitz, [facility] Clinical Social Worker cell #: [phone]

## 2016-12-09 NOTE — Discharge Summary (Signed)
Physician Discharge Summary  [name] [id number] DOB: [birth year] DOA: 12/06/2016  PCP: Chrisandra Netters, MD  Admit date: 12/06/2016 Discharge date: 12/09/2016  Admitted From: Home Disposition: Home   Recommendations for Outpatient Follow-up:  1. Follow up with PCP arranged prior to discharge 1/2 at 9:15am 2. Consider PFT's or pulmonology referral if wheezing proves to be chronic. 3. Phenytoin level subtherapeutic on admission. Suggest recheck as outpatient. Very reluctant to change dose given h/o toxicity.  Home Health: None; SNF was actually recommended earlier in the admission due to pt's acute condition, but  Equipment/Devices: None - pt has ambulated unassisted repeatedly, so no rolling walker was ordered. Discharge Condition: Stable CODE STATUS: Full Diet recommendation: Regular  Brief/Interim Summary: [name] a 60 y.o.malewith a past medical history significant for anxiety, depression, HLD, and GERD who presented to the hospital 12/21 with complaint of weakness. He reported 3 days of body aches, weakness, productive cough and fever. In the ED, he was wheezing with hypoxemia. WBC 13.7, lactate 1.4. CXR showed bronchitic changes without focal consolidation. He was given 3L NS, ceftriaxone and azithromycin for presumed pneumonia, and IV steroids with bronchodilators which improved dyspnea, though oxygen requirement remained. Hospitalist was consulted for admission for acute respiratory failure. Respiratory viral panel eventually demonstrated metapneumovirus, so supportive care for bronchiolitis was continued with oxygen and antibiotics for atypical pneumonia. Improvement was slow with resolution of hypoxemia on 12/22 despite significant ongoing wheezing. He was only able to ambulate a few feet with PT and SNF was recommended on 12/22. On 12/23 he reported resolution of dyspnea and was able to ambulate the halls of [name] unassisted. When this improvement was sustained for  24 hours he was discharged in stable condition, provided with taxi voucher, to return home with his girlfriend. No PT follow up is thought to be necessary as the patient was sprinting in the hallway this morning.   Discharge Diagnoses:  Principal Problem:   Sepsis ([place]) Active Problems:   Hyperlipidemia   Seizure disorder ([place])   Constipation, chronic   Schizoaffective disorder ([facility])   Severe major depression ([facility])   Essential hypertension   Falls   Mild sleep apnea  Sepsis and acute respiratory failure due to viral bronchiolitis +/- atypical pneumonia: Suspect viral (metapneumovirus) + bacterial/atypical etiology. CXR without focal consolidation. Strep Ag neg. - RVP +metapneumovirus, flu negative. - Increase duoneb to q8h w/q2h prn albuterol.  - Stopped ceftriaxone. Completed 5 day course of azithromycin - Monitor blood cultures (no growth x3 days at discharge) - Tussionex prescribed at discharge and tessalon prn cough - Monitor sputum culture - Pt reports several months of end-expiratory wheezing. While he has considerable anxiety, could consider outpatient PFT's/pulm referral.   Hypertension: Chronic, stable. - Restart hydrochlorothiazide and Toprol-XL  Schizoaffective/depression: Chronic, stable. 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  - Continue Elavil, Klonopin, Seroquel - No indication for psychiatry consult, has scheduled follow up as outpatient  Weakness/lassitude: Related to chronic neurovegetative state. - At time of PT consult, pt tolerated only 54ft w/RW. After declining SNF, he is ambulating constantly in the halls with steady gait. Do not believe he was putting forth complete effort with PT. Had to ask to stop sprinting in the hallway this morning.   Seizure disorder: Chronic, stable. Phenytoin level subtherapeutic on admission. - Sz precautions, ativan prn seizure -  Continue Vimpat, Dilantin - Suggest recollection of phenytoin level as outpatient. Very  reluctant to change dose given h/o toxicity.  Androgen deficiency: Managed by urology - Testosterone biweekly, not administered during admission  Constipation: Chronic, stable - Continue miralax  Hyperlipidemia: Chronic, stable  - Continue statin - Last LDL in 2016. Consider recheck as outpatient.  Bacteruria: Asymptomatic, culture showed 40k colonies of enterococcus - No intent to treat (received CTX for PNA anyway)  Discharge Instructions Discharge Instructions    Call MD for:  difficulty breathing, headache or visual disturbances    Complete by:  As directed    Call MD for:  extreme fatigue    Complete by:  As directed    Call MD for:  persistant dizziness or light-headedness    Complete by:  As directed    Call MD for:  persistant nausea and vomiting    Complete by:  As directed    Call MD for:  temperature >100.4    Complete by:  As directed    Discharge instructions    Complete by:  As directed    You were admitted with difficulty breathing due to a viral bronchiolitis due to metapneumovirus. This has been treated supportively and antibiotics have been given for the suspected presence of atypical bacterial infection. You have completed this course and have demonstrated no trouble breathing with exertion so you can be discharged with the following recommendations:  - Follow up with your PCP as scheduled and/or return immediately if new symptoms arise or existing symptoms worsen.  - Take tussionex as needed for the cough. This can make you drowsy so don't drive or operate machinery while using this. While the cough may last several weeks, the pain associated with it is not expected to last very long.  - Have a Merry Christmas!   Increase activity slowly    Complete by:  As directed      Allergies as of 12/09/2016      Reactions   Codeine Nausea And Vomiting   Sulfa Antibiotics  Nausea And Vomiting      Medication List    STOP taking these medications   ALDARA 5 % cream Generic drug:  imiquimod   gabapentin 100 MG capsule Commonly known as:  NEURONTIN   Selenium Sulf-Pyrithione-Urea 2.25 % Sham   senna 8.6 MG tablet Commonly known as:  [facility] SENNA     TAKE these medications   amitriptyline 100 MG tablet Commonly known as:  ELAVIL Take 2 tablets (200 mg total) by mouth at bedtime.   aspirin EC 81 MG tablet Take 1 tablet (81 mg total) by mouth daily.   chlorpheniramine-HYDROcodone 10-8 MG/5ML Suer Commonly known as:  TUSSIONEX Take 5 mLs by mouth every 12 (twelve) hours as needed for cough.   clonazePAM 1 MG tablet Commonly known as:  KLONOPIN Take 1 tablet (1 mg total) by mouth 3 (three) times daily.   [facility] SENNA PLUS 8.6-50 MG tablet Generic drug:  senna-docusate TAKE 1 TABLET BY MOUTH AT BEDTIME AS NEEDED FOR MILD CONSTIPATION   hydrochlorothiazide 12.5 MG tablet Commonly known as:  HYDRODIURIL TAKE 1 TABLET (12.5 MG TOTAL) BY MOUTH DAILY.   metoprolol succinate 50 MG 24 hr tablet Commonly known as:  TOPROL-XL TAKE 1 TABLET BY MOUTH AT BEDTIME WITH OR IMMEDIATELY FOLLOWING A MEAL   multivitamin with minerals Tabs tablet Take 1 tablet by mouth daily.   phenytoin 100 MG ER capsule Commonly known as:  DILANTIN TAKE 2 CAPSULES BY MOUTH AT NIGHT   phenytoin 50 MG tablet Commonly known as:  DILANTIN  TAKE 1 TABLET BY MOUTH AT NIGHT   polyethylene glycol powder powder Commonly known as:  GLYCOLAX/MIRALAX TAKE 17 GRAMS 2 TIMES A DAY AS NEEDED FOR CONSTIPATION. MIX INTO 8 OF FLUID AND DRINK   QUEtiapine 300 MG tablet Commonly known as:  SEROQUEL Take 2 tablets (600 mg total) by mouth at bedtime.   simvastatin 40 MG tablet Commonly known as:  ZOCOR TAKE 1 TABLET (40 MG TOTAL) BY MOUTH AT BEDTIME.   testosterone cypionate 200 MG/ML injection Commonly known as:  DEPOTESTOSTERONE CYPIONATE Inject 0.75 mLs (150 mg total) into the muscle  every 14 (fourteen) days.   VIMPAT 150 MG Tabs Generic drug:  Lacosamide TAKE 1 TABLET BY MOUTH TWICE A DAY      Follow-up Information    Chrisandra Netters, MD Follow up.   Specialty:  Family Medicine Contact information: [place] [address] [phone]          Allergies  Allergen Reactions  . Codeine Nausea And Vomiting  . Sulfa Antibiotics Nausea And Vomiting    Consultations:  None  Procedures/Studies: Dg Chest 2 View  Result Date: 12/07/2016 CLINICAL DATA:  The sepsis. Body aches and weakness with fever, vomiting, and cough beginning 3 days ago. Stabbing chest pain with cough. EXAM: CHEST  2 VIEW COMPARISON:  Two-view chest x-ray 12/06/2016. FINDINGS: Heart size normal. Lung volumes are low. Bibasilar airspace disease is new, likely reflecting atelectasis. Mild interstitial coarsening is otherwise stable. No definite edema or effusions are present. IMPRESSION: 1. Decreasing lung volumes. 2. Mild bibasilar airspace disease likely reflects atelectasis. Infection is not excluded. Electronically Signed   By: San Morelle M.D.   On: 12/07/2016 08:40   Dg Chest 2 View  Result Date: 12/06/2016 CLINICAL DATA:  60 year old male with fever and wheezing. Shortness of breath. EXAM: CHEST  2 VIEW COMPARISON:  Chest radiograph dated 02/15/2015 FINDINGS: Shallow inspiration. No focal consolidation, pleural effusion, or pneumothorax. Minimal left lung base linear atelectasis/ scarring. The cardiac silhouette is within normal limits. No acute osseous pathology identified. Left shoulder arthroplasty and right humeral head fixation screws noted. IMPRESSION: No active cardiopulmonary disease. Electronically Signed   By: Anner Crete M.D.   On: 12/06/2016 03:29     Subjective: Pt reports no dyspnea despite sprinting in hallway this morning. No chest pain. Still wheezing, but wants to go home. No other complaints.   Discharge Exam: Vitals:    12/09/16 0545 12/09/16 0956  BP: (!) 143/96 135/88  Pulse: 92 91  Resp: 20   Temp: 97.6 F (36.4 C) 98.5 F (36.9 C)   Vitals:   12/08/16 2231 12/09/16 0545 12/09/16 0858 12/09/16 0956  BP: 107/70 (!) 143/96  135/88  Pulse: 74 92  91  Resp: 20 20    Temp: 98.3 F (36.8 C) 97.6 F (36.4 C)  98.5 F (36.9 C)  TempSrc: Oral Oral  Oral  SpO2: 98% 98% 97% 96%  Weight:      Height:       General: Pt is alert, awake, not in acute distress Cardiovascular: RRR, S1/S2 +, no rubs, no gallops Respiratory: Nonlabored, 99% on room air, normal rate with scattered rhonchi/wheezing evident with and without stethoscope, no prolongation of expiration.  Abdominal: Soft, NT, ND, bowel sounds + Extremities: no edema, no cyanosis Skin: Scattered scars from cutting on right forearm. No rash Psych: Judgement and insight appear normal. Mood & affect appropriate. No SI/HI or hallucinations.   The results of significant diagnostics from this  hospitalization (including imaging, microbiology, ancillary and laboratory) are listed below for reference.    Microbiology: Recent Results (from the past 240 hour(s))  Blood Culture (routine x 2)     Status: None (Preliminary result)   Collection Time: 12/06/16  2:30 AM  Result Value Ref Range Status   Specimen Description BLOOD RIGHT HAND  Final   Special Requests BOTTLES DRAWN AEROBIC AND ANAEROBIC 5CC  Final   Culture   Final    NO GROWTH 3 DAYS Performed at [facility]    Report Status PENDING  Incomplete  Blood Culture (routine x 2)     Status: None (Preliminary result)   Collection Time: 12/06/16  2:50 AM  Result Value Ref Range Status   Specimen Description BLOOD BLOOD LEFT FOREARM  Final   Special Requests BOTTLES DRAWN AEROBIC AND ANAEROBIC 5ML  Final   Culture   Final    NO GROWTH 3 DAYS Performed at [facility]    Report Status PENDING  Incomplete  Urine culture     Status: Abnormal   Collection Time: 12/06/16  3:39 AM   Result Value Ref Range Status   Specimen Description URINE, CLEAN CATCH  Final   Special Requests NONE  Final   Culture 40,000 COLONIES/mL ENTEROBACTER SPECIES (A)  Final   Report Status 12/09/2016 FINAL  Final   Organism ID, Bacteria ENTEROBACTER SPECIES (A)  Final      Susceptibility   Enterobacter species - MIC*    CEFAZOLIN 8 RESISTANT Resistant     CEFTRIAXONE <=1 SENSITIVE Sensitive     CIPROFLOXACIN <=0.25 SENSITIVE Sensitive     GENTAMICIN <=1 SENSITIVE Sensitive     IMIPENEM 1 SENSITIVE Sensitive     NITROFURANTOIN <=16 SENSITIVE Sensitive     TRIMETH/SULFA <=20 SENSITIVE Sensitive     PIP/TAZO <=4 SENSITIVE Sensitive     * 40,000 COLONIES/mL ENTEROBACTER SPECIES  Respiratory Panel by PCR     Status: Abnormal   Collection Time: 12/07/16  1:00 PM  Result Value Ref Range Status   Adenovirus NOT DETECTED NOT DETECTED Final   Coronavirus 229E NOT DETECTED NOT DETECTED Final   Coronavirus HKU1 NOT DETECTED NOT DETECTED Final   Coronavirus NL63 NOT DETECTED NOT DETECTED Final   Coronavirus OC43 NOT DETECTED NOT DETECTED Final   Metapneumovirus DETECTED (A) NOT DETECTED Final   Rhinovirus / Enterovirus NOT DETECTED NOT DETECTED Final   Influenza A NOT DETECTED NOT DETECTED Final   Influenza B NOT DETECTED NOT DETECTED Final   Parainfluenza Virus 1 NOT DETECTED NOT DETECTED Final   Parainfluenza Virus 2 NOT DETECTED NOT DETECTED Final   Parainfluenza Virus 3 NOT DETECTED NOT DETECTED Final   Parainfluenza Virus 4 NOT DETECTED NOT DETECTED Final   Respiratory Syncytial Virus NOT DETECTED NOT DETECTED Final   Bordetella pertussis NOT DETECTED NOT DETECTED Final   Chlamydophila pneumoniae NOT DETECTED NOT DETECTED Final   Mycoplasma pneumoniae NOT DETECTED NOT DETECTED Final    Comment: Performed at [facility]  Culture, sputum-assessment     Status: None   Collection Time: 12/07/16  1:04 PM  Result Value Ref Range Status   Specimen Description SPUTUM  Final    Special Requests NONE  Final   Sputum evaluation THIS SPECIMEN IS ACCEPTABLE FOR SPUTUM CULTURE  Final   Report Status 12/07/2016 FINAL  Final  Culture, respiratory (NON-Expectorated)     Status: None (Preliminary result)   Collection Time: 12/07/16  1:04 PM  Result Value Ref  Range Status   Specimen Description SPUTUM  Final   Special Requests NONE Reflexed from [id number]  Final   Gram Stain   Final    MODERATE WBC PRESENT, PREDOMINANTLY PMN NO ORGANISMS SEEN    Culture   Final    NO GROWTH < 24 HOURS Performed at [facility]    Report Status PENDING  Incomplete     Labs: BNP (last 3 results) No results for input(s): BNP in the last 8760 hours. Basic Metabolic Panel:  Recent Labs Lab 12/06/16 0328 12/06/16 1036 12/07/16 0835 12/08/16 0613 12/09/16 0536  NA 133*  --  137 140 138  K 3.7  --  4.2 4.0 4.3  CL 99*  --  107 109 106  CO2 23  --  24 24 26   GLUCOSE 184*  --  115* 90 106*  BUN 17  --  10 11 14   CREATININE 1.07 0.98 0.98 0.91 0.89  CALCIUM 8.5*  --  7.7* 8.1* 8.4*   Liver Function Tests:  Recent Labs Lab 12/06/16 0328  AST 24  ALT 20  ALKPHOS 79  BILITOT 0.5  PROT 7.0  ALBUMIN 3.9   No results for input(s): LIPASE, AMYLASE in the last 168 hours. No results for input(s): AMMONIA in the last 168 hours. CBC:  Recent Labs Lab 12/06/16 0328 12/06/16 1036 12/07/16 0835 12/08/16 0613 12/09/16 0536  WBC 13.7* 11.1* 5.1 6.0 6.8  NEUTROABS 11.1*  --   --   --   --   HGB 15.8 15.1 14.3 13.9 14.7  HCT 44.2 42.9 39.9 41.1 42.3  MCV 87.4 87.4 87.1 88.4 87.8  PLT 185 169 155 160 180   Cardiac Enzymes: No results for input(s): CKTOTAL, CKMB, CKMBINDEX, TROPONINI in the last 168 hours. BNP: Invalid input(s): POCBNP CBG: No results for input(s): GLUCAP in the last 168 hours. D-Dimer No results for input(s): DDIMER in the last 72 hours. Hgb A1c No results for input(s): HGBA1C in the last 72 hours. Lipid Profile No results for input(s): CHOL,  HDL, LDLCALC, TRIG, CHOLHDL, LDLDIRECT in the last 72 hours. Thyroid function studies No results for input(s): TSH, T4TOTAL, T3FREE, THYROIDAB in the last 72 hours.  Invalid input(s): FREET3 Anemia work up No results for input(s): VITAMINB12, FOLATE, FERRITIN, TIBC, IRON, RETICCTPCT in the last 72 hours. Urinalysis    Component Value Date/Time   COLORURINE YELLOW 12/06/2016 0339   APPEARANCEUR HAZY (A) 12/06/2016 0339   LABSPEC 1.011 12/06/2016 0339   PHURINE 5.0 12/06/2016 0339   GLUCOSEU >=500 (A) 12/06/2016 0339   HGBUR NEGATIVE 12/06/2016 0339   HGBUR negative 07/18/2010 1436   BILIRUBINUR NEGATIVE 12/06/2016 0339   BILIRUBINUR NEG 01/17/2016 1446   KETONESUR NEGATIVE 12/06/2016 0339   PROTEINUR NEGATIVE 12/06/2016 0339   UROBILINOGEN 0.2 01/17/2016 1446   UROBILINOGEN 0.2 07/29/2015 1708   NITRITE NEGATIVE 12/06/2016 0339   LEUKOCYTESUR MODERATE (A) 12/06/2016 0339   Sepsis Labs Invalid input(s): PROCALCITONIN,  WBC,  LACTICIDVEN Microbiology Recent Results (from the past 240 hour(s))  Blood Culture (routine x 2)     Status: None (Preliminary result)   Collection Time: 12/06/16  2:30 AM  Result Value Ref Range Status   Specimen Description BLOOD RIGHT HAND  Final   Special Requests BOTTLES DRAWN AEROBIC AND ANAEROBIC 5CC  Final   Culture   Final    NO GROWTH 3 DAYS Performed at [facility]    Report Status PENDING  Incomplete  Blood Culture (routine x 2)  Status: None (Preliminary result)   Collection Time: 12/06/16  2:50 AM  Result Value Ref Range Status   Specimen Description BLOOD BLOOD LEFT FOREARM  Final   Special Requests BOTTLES DRAWN AEROBIC AND ANAEROBIC 5ML  Final   Culture   Final    NO GROWTH 3 DAYS Performed at [facility]    Report Status PENDING  Incomplete  Urine culture     Status: Abnormal   Collection Time: 12/06/16  3:39 AM  Result Value Ref Range Status   Specimen Description URINE, CLEAN CATCH  Final   Special  Requests NONE  Final   Culture 40,000 COLONIES/mL ENTEROBACTER SPECIES (A)  Final   Report Status 12/09/2016 FINAL  Final   Organism ID, Bacteria ENTEROBACTER SPECIES (A)  Final      Susceptibility   Enterobacter species - MIC*    CEFAZOLIN 8 RESISTANT Resistant     CEFTRIAXONE <=1 SENSITIVE Sensitive     CIPROFLOXACIN <=0.25 SENSITIVE Sensitive     GENTAMICIN <=1 SENSITIVE Sensitive     IMIPENEM 1 SENSITIVE Sensitive     NITROFURANTOIN <=16 SENSITIVE Sensitive     TRIMETH/SULFA <=20 SENSITIVE Sensitive     PIP/TAZO <=4 SENSITIVE Sensitive     * 40,000 COLONIES/mL ENTEROBACTER SPECIES  Respiratory Panel by PCR     Status: Abnormal   Collection Time: 12/07/16  1:00 PM  Result Value Ref Range Status   Adenovirus NOT DETECTED NOT DETECTED Final   Coronavirus 229E NOT DETECTED NOT DETECTED Final   Coronavirus HKU1 NOT DETECTED NOT DETECTED Final   Coronavirus NL63 NOT DETECTED NOT DETECTED Final   Coronavirus OC43 NOT DETECTED NOT DETECTED Final   Metapneumovirus DETECTED (A) NOT DETECTED Final   Rhinovirus / Enterovirus NOT DETECTED NOT DETECTED Final   Influenza A NOT DETECTED NOT DETECTED Final   Influenza B NOT DETECTED NOT DETECTED Final   Parainfluenza Virus 1 NOT DETECTED NOT DETECTED Final   Parainfluenza Virus 2 NOT DETECTED NOT DETECTED Final   Parainfluenza Virus 3 NOT DETECTED NOT DETECTED Final   Parainfluenza Virus 4 NOT DETECTED NOT DETECTED Final   Respiratory Syncytial Virus NOT DETECTED NOT DETECTED Final   Bordetella pertussis NOT DETECTED NOT DETECTED Final   Chlamydophila pneumoniae NOT DETECTED NOT DETECTED Final   Mycoplasma pneumoniae NOT DETECTED NOT DETECTED Final    Comment: Performed at [facility]  Culture, sputum-assessment     Status: None   Collection Time: 12/07/16  1:04 PM  Result Value Ref Range Status   Specimen Description SPUTUM  Final   Special Requests NONE  Final   Sputum evaluation THIS SPECIMEN IS ACCEPTABLE FOR SPUTUM CULTURE   Final   Report Status 12/07/2016 FINAL  Final  Culture, respiratory (NON-Expectorated)     Status: None (Preliminary result)   Collection Time: 12/07/16  1:04 PM  Result Value Ref Range Status   Specimen Description SPUTUM  Final   Special Requests NONE Reflexed from [id number]  Final   Gram Stain   Final    MODERATE WBC PRESENT, PREDOMINANTLY PMN NO ORGANISMS SEEN    Culture   Final    NO GROWTH < 24 HOURS Performed at [facility]    Report Status PENDING  Incomplete    Time coordinating discharge: Over 109 minutes  Vance Gather, MD  [name] 12/09/2016, 10:57 AM Pager [phone]  If 7PM-7AM, please contact night-coverage [email or web address] Password [email or web address]

## 2016-12-11 LAB — CULTURE, BLOOD (ROUTINE X 2)
Culture: NO GROWTH
Culture: NO GROWTH

## 2016-12-12 NOTE — Telephone Encounter (Signed)
Addendum - I was contacted by [facility], who let me know that they WILL be able to take over managing patient's testosterone.   Red team, please call him and let him know that he should follow up with [facility] for all further testosterone management.  Thanks, Leeanne Rio, MD

## 2016-12-17 ENCOUNTER — Telehealth: Payer: Self-pay | Admitting: Family Medicine

## 2016-12-17 NOTE — Telephone Encounter (Signed)
I happened to notice that this patient was listed for an appointment for tomorrow morning (Tuesday) despite our clinic being closed for the New Years holiday. I am not sure how this appointment was scheduled, given that the clinic will not be open.  I called patient's cell phone and left a voicemail saying that appointment needs to be rescheduled as the clinic won't be open. Left info on VM with rescheduled appointment time (2:30pm on Wednesday 1/3 with Dr. Juleen China). Asked patient to call clinic at [phone] with any questions or concerns.  Leeanne Rio, MD

## 2016-12-18 ENCOUNTER — Inpatient Hospital Stay: Payer: Self-pay | Admitting: Family Medicine

## 2016-12-19 ENCOUNTER — Inpatient Hospital Stay: Payer: Self-pay | Admitting: Internal Medicine

## 2016-12-21 NOTE — Telephone Encounter (Signed)
Patients voicemail is full and can not accept any messages. Called patients home phone also and left message for patient to call office back. If he does call please see previous note and relay information to him. Thanks. Nat Christen, CMA

## 2016-12-31 ENCOUNTER — Ambulatory Visit (HOSPITAL_COMMUNITY): Payer: Self-pay | Admitting: Clinical

## 2017-01-01 ENCOUNTER — Encounter (HOSPITAL_COMMUNITY): Payer: Self-pay | Admitting: Psychiatry

## 2017-01-01 ENCOUNTER — Ambulatory Visit (INDEPENDENT_AMBULATORY_CARE_PROVIDER_SITE_OTHER): Payer: Medicaid Other | Admitting: Psychiatry

## 2017-01-01 DIAGNOSIS — Z818 Family history of other mental and behavioral disorders: Secondary | ICD-10-CM | POA: Diagnosis not present

## 2017-01-01 DIAGNOSIS — F251 Schizoaffective disorder, depressive type: Secondary | ICD-10-CM

## 2017-01-01 DIAGNOSIS — Z79899 Other long term (current) drug therapy: Secondary | ICD-10-CM | POA: Diagnosis not present

## 2017-01-01 DIAGNOSIS — Z7982 Long term (current) use of aspirin: Secondary | ICD-10-CM | POA: Diagnosis not present

## 2017-01-01 MED ORDER — QUETIAPINE FUMARATE 300 MG PO TABS: 600.0000 mg | ORAL_TABLET | Freq: Every day | ORAL | 2 refills | Status: DC

## 2017-01-01 MED ORDER — CLONAZEPAM 1 MG PO TABS: 1.0000 mg | ORAL_TABLET | Freq: Three times a day (TID) | ORAL | 2 refills | Status: DC

## 2017-01-01 MED ORDER — AMITRIPTYLINE HCL 100 MG PO TABS: 200.0000 mg | ORAL_TABLET | Freq: Every day | ORAL | 2 refills | Status: DC

## 2017-01-01 NOTE — Progress Notes (Signed)
[place] [phone] Progress Note  [name] [id number] 61 y.o.  01/01/2017 4:18 PM  Chief Complaint:  I was sick but I am doing better now.          History of Present Illness:  [name] came for his follow-up appointment.  On Christmas Eve he was hospitalized due to sickness and high-grade fever.  He is feeling better now.  He is taking his psychiatric medication.  For past 2 times he has seen covering physician who increased his amitriptyline to 200 mg.  He sleeping better.  However he continues to talk about "mantra," when I ask him to explain more in detail he mentioned that it is a feeling that he gets which makes him more scared and fearful.  But his relief that he is not having these feelings anymore.  He also mentioned few months ago he changed his lock and he feels more secure.  Though he is still believe his brother might be break into his house.  He denies any hallucination or any paranoia but he continued to believe that his brother might plan to harm him.  He does not believe this is his delusions.  He is no longer taking gabapentin because he felt it was not working.  He really likes Seroquel and increase amitriptyline.  Though he has mild dry mouth but no other major concerns or side effects.  He denies any crying spells, feeling hopelessness or worthlessness.  His appetite is okay.  His vital signs are stable.  He denies drinking alcohol or using any illegal substances.  He lives by himself.  Suicidal Ideation: No Plan Formed: No Patient has means to carry out plan: No  Homicidal Ideation: No Plan Formed: No Patient has means to carry out plan: No  Past Psychiatric History/Hospitalization(s): Patient has severe depression since his college.  He's been taking medication since 1984.  He has at least 15 psychiatric hospitalization due to severe depression, paranoia and suicidal thoughts.  His last psychiatric hospitalization was at behavioral Center where he was admitted  from 07/13/2015 to 07/21/2015.  He was thinking to asphyxiate on helium.  At that time he was taking Seroquel 900 mg at bedtime, amitriptyline 400 mg at bedtime and Klonopin 2 mg 4 times a day.  He had one history of suicidal attempt when he cut his testicle because he does not want to be depressed.  He admitted history of paranoia and negative thinking.  He was seeing at [name] at [facility] however it closed he moved to [name] to continue with [name].  He had tried numerous SSRIs however he always had opposite reaction with the SSRI.  He felt poor sleep, restlessness, irritability and unable to tolerate SSRIs.  In the past she had tried nortriptyline, Haldol, Geodon, Risperdal, lithium, Prozac, Wellbutrin , Paxil, Ativan, Lexapro and Zoloft.  He denies any history of mania.  Patient denies any history of drugs or alcohol use.  Medical History; Reviewed.  Patient was recently hospitalized because of infection and high fever.  He has hypertension, GERD, hyperlipidemia, mild memory impairment, seizure disorder.  His primary care physician is [facility]. Patient also goes to [facility] neurology for seizures management.  Past Medical History:  Diagnosis Date  . Anxiety   . Constipation   . Depression   . Dyslipidemia   . GERD (gastroesophageal reflux disease)   . Hearing loss   . Hypertension   . Memory loss   .  MRSA carrier   . Schizoaffective disorder   . Seizures ([place])     Family History; Patient reported his uncle had severe depression and committed suicide by killing himself  Education and Work History; Patient was in the college when he had severe depression and he has to leave the college.  He is on disability.  Psychosocial History; Patient born and raised in [place].  He never married.  He has no children.  He has a [name] friend who he know for 15 years and very supportive.  His parents are deceased.  His father died 3 years ago.   He has one brother who live in [place].  Review of Systems  Constitutional: Negative.   HENT: Negative.   Cardiovascular: Negative.   Musculoskeletal: Negative.   Skin: Negative.   Neurological: Negative.    Neurologic: Headache: No Seizure: History of seizures Paresthesias: Yes   Outpatient Encounter Prescriptions as of 01/01/2017  Medication Sig  . amitriptyline (ELAVIL) 100 MG tablet Take 2 tablets (200 mg total) by mouth at bedtime.  Marland Kitchen aspirin EC 81 MG tablet Take 1 tablet (81 mg total) by mouth daily.  . clonazePAM (KLONOPIN) 1 MG tablet Take 1 tablet (1 mg total) by mouth 3 (three) times daily.  . [employer] SENNA PLUS 8.6-50 MG tablet TAKE 1 TABLET BY MOUTH AT BEDTIME AS NEEDED FOR MILD CONSTIPATION  . hydrochlorothiazide (HYDRODIURIL) 12.5 MG tablet TAKE 1 TABLET (12.5 MG TOTAL) BY MOUTH DAILY.  . metoprolol succinate (TOPROL-XL) 50 MG 24 hr tablet TAKE 1 TABLET BY MOUTH AT BEDTIME WITH OR IMMEDIATELY FOLLOWING A MEAL  . Multiple Vitamin (MULTIVITAMIN WITH MINERALS) TABS tablet Take 1 tablet by mouth daily.  . phenytoin (DILANTIN) 100 MG ER capsule TAKE 2 CAPSULES BY MOUTH AT NIGHT  . phenytoin (DILANTIN) 50 MG tablet TAKE 1 TABLET BY MOUTH AT NIGHT  . polyethylene glycol powder (GLYCOLAX/MIRALAX) powder TAKE 17 GRAMS 2 TIMES A DAY AS NEEDED FOR CONSTIPATION. MIX INTO 8 OF FLUID AND DRINK  . QUEtiapine (SEROQUEL) 300 MG tablet Take 2 tablets (600 mg total) by mouth at bedtime.  . simvastatin (ZOCOR) 40 MG tablet TAKE 1 TABLET (40 MG TOTAL) BY MOUTH AT BEDTIME.  Marland Kitchen testosterone cypionate (DEPOTESTOSTERONE CYPIONATE) 200 MG/ML injection Inject 0.75 mLs (150 mg total) into the muscle every 14 (fourteen) days.  Marland Kitchen VIMPAT 150 MG TABS TAKE 1 TABLET BY MOUTH TWICE A DAY  . [DISCONTINUED] amitriptyline (ELAVIL) 100 MG tablet Take 2 tablets (200 mg total) by mouth at bedtime.  . [DISCONTINUED] clonazePAM (KLONOPIN) 1 MG tablet Take 1 tablet (1 mg total) by mouth 3 (three) times daily.  .  [DISCONTINUED] QUEtiapine (SEROQUEL) 300 MG tablet Take 2 tablets (600 mg total) by mouth at bedtime.  . [DISCONTINUED] chlorpheniramine-HYDROcodone (TUSSIONEX) 10-8 MG/5ML SUER Take 5 mLs by mouth every 12 (twelve) hours as needed for cough. (Patient not taking: Reported on 01/01/2017)   No facility-administered encounter medications on file as of 01/01/2017.     Recent Results (from the past 2160 hour(s))  Blood Culture (routine x 2)     Status: None   Collection Time: 12/06/16  2:30 AM  Result Value Ref Range   Specimen Description BLOOD RIGHT HAND    Special Requests BOTTLES DRAWN AEROBIC AND ANAEROBIC 5CC    Culture      NO GROWTH 5 DAYS Performed at [facility]    Report Status 12/11/2016 FINAL   Blood Culture (routine x 2)     Status:  None   Collection Time: 12/06/16  2:50 AM  Result Value Ref Range   Specimen Description BLOOD BLOOD LEFT FOREARM    Special Requests BOTTLES DRAWN AEROBIC AND ANAEROBIC 5ML    Culture      NO GROWTH 5 DAYS Performed at [facility]    Report Status 12/11/2016 FINAL   Comprehensive metabolic panel     Status: Abnormal   Collection Time: 12/06/16  3:28 AM  Result Value Ref Range   Sodium 133 (L) 135 - 145 mmol/L   Potassium 3.7 3.5 - 5.1 mmol/L   Chloride 99 (L) 101 - 111 mmol/L   CO2 23 22 - 32 mmol/L   Glucose, Bld 184 (H) 65 - 99 mg/dL   BUN 17 6 - 20 mg/dL   Creatinine, Ser 1.07 0.61 - 1.24 mg/dL   Calcium 8.5 (L) 8.9 - 10.3 mg/dL   Total Protein 7.0 6.5 - 8.1 g/dL   Albumin 3.9 3.5 - 5.0 g/dL   AST 24 15 - 41 U/L   ALT 20 17 - 63 U/L   Alkaline Phosphatase 79 38 - 126 U/L   Total Bilirubin 0.5 0.3 - 1.2 mg/dL   GFR calc non Af Amer >60 >60 mL/min   GFR calc Af Amer >60 >60 mL/min    Comment: (NOTE) The eGFR has been calculated using the CKD EPI equation. This calculation has not been validated in all clinical situations. eGFR's persistently <60 mL/min signify possible Chronic Kidney Disease.    Anion gap 11 5 -  15  CBC WITH DIFFERENTIAL     Status: Abnormal   Collection Time: 12/06/16  3:28 AM  Result Value Ref Range   WBC 13.7 (H) 4.0 - 10.5 K/uL   RBC 5.06 4.22 - 5.81 MIL/uL   Hemoglobin 15.8 13.0 - 17.0 g/dL   HCT 44.2 39.0 - 52.0 %   MCV 87.4 78.0 - 100.0 fL   MCH 31.2 26.0 - 34.0 pg   MCHC 35.7 30.0 - 36.0 g/dL   RDW 15.4 11.5 - 15.5 %   Platelets 185 150 - 400 K/uL   Neutrophils Relative % 82 %   Neutro Abs 11.1 (H) 1.7 - 7.7 K/uL   Lymphocytes Relative 10 %   Lymphs Abs 1.4 0.7 - 4.0 K/uL   Monocytes Relative 8 %   Monocytes Absolute 1.1 (H) 0.1 - 1.0 K/uL   Eosinophils Relative 0 %   Eosinophils Absolute 0.1 0.0 - 0.7 K/uL   Basophils Relative 0 %   Basophils Absolute 0.0 0.0 - 0.1 K/uL  I-Stat CG4 Lactic Acid, ED  (not at  [facility])     Status: Abnormal   Collection Time: 12/06/16  3:30 AM  Result Value Ref Range   Lactic Acid, Venous 2.61 (HH) 0.5 - 1.9 mmol/L   Comment NOTIFIED PHYSICIAN   Urinalysis, Routine w reflex microscopic     Status: Abnormal   Collection Time: 12/06/16  3:39 AM  Result Value Ref Range   Color, Urine YELLOW YELLOW   APPearance HAZY (A) CLEAR   Specific Gravity, Urine 1.011 1.005 - 1.030   pH 5.0 5.0 - 8.0   Glucose, UA >=500 (A) NEGATIVE mg/dL   Hgb urine dipstick NEGATIVE NEGATIVE   Bilirubin Urine NEGATIVE NEGATIVE   Ketones, ur NEGATIVE NEGATIVE mg/dL   Protein, ur NEGATIVE NEGATIVE mg/dL   Nitrite NEGATIVE NEGATIVE   Leukocytes, UA MODERATE (A) NEGATIVE   RBC / HPF 0-5 0 - 5 RBC/hpf   WBC,  UA TOO NUMEROUS TO COUNT 0 - 5 WBC/hpf   Bacteria, UA FEW (A) NONE SEEN   Squamous Epithelial / LPF 0-5 (A) NONE SEEN   Mucous PRESENT   Urine culture     Status: Abnormal   Collection Time: 12/06/16  3:39 AM  Result Value Ref Range   Specimen Description URINE, CLEAN CATCH    Special Requests NONE    Culture 40,000 COLONIES/mL ENTEROBACTER SPECIES (A)    Report Status 12/09/2016 FINAL    Organism ID, Bacteria ENTEROBACTER SPECIES (A)        Susceptibility   Enterobacter species - MIC*    CEFAZOLIN 8 RESISTANT Resistant     CEFTRIAXONE <=1 SENSITIVE Sensitive     CIPROFLOXACIN <=0.25 SENSITIVE Sensitive     GENTAMICIN <=1 SENSITIVE Sensitive     IMIPENEM 1 SENSITIVE Sensitive     NITROFURANTOIN <=16 SENSITIVE Sensitive     TRIMETH/SULFA <=20 SENSITIVE Sensitive     PIP/TAZO <=4 SENSITIVE Sensitive     * 40,000 COLONIES/mL ENTEROBACTER SPECIES  I-Stat CG4 Lactic Acid, ED  (not at  [facility])     Status: None   Collection Time: 12/06/16  6:32 AM  Result Value Ref Range   Lactic Acid, Venous 1.40 0.5 - 1.9 mmol/L  Influenza panel by PCR (type A & B, H1N1)     Status: None   Collection Time: 12/06/16  9:42 AM  Result Value Ref Range   Influenza A By PCR NEGATIVE NEGATIVE   Influenza B By PCR NEGATIVE NEGATIVE    Comment: (NOTE) The Xpert Xpress Flu assay is intended as an aid in the diagnosis of  influenza and should not be used as a sole basis for treatment.  This  assay is FDA approved for nasopharyngeal swab specimens only. Nasal  washings and aspirates are unacceptable for Xpert Xpress Flu testing.   CBC     Status: Abnormal   Collection Time: 12/06/16 10:36 AM  Result Value Ref Range   WBC 11.1 (H) 4.0 - 10.5 K/uL   RBC 4.91 4.22 - 5.81 MIL/uL   Hemoglobin 15.1 13.0 - 17.0 g/dL   HCT 42.9 39.0 - 52.0 %   MCV 87.4 78.0 - 100.0 fL   MCH 30.8 26.0 - 34.0 pg   MCHC 35.2 30.0 - 36.0 g/dL   RDW 15.6 (H) 11.5 - 15.5 %   Platelets 169 150 - 400 K/uL  Creatinine, serum     Status: None   Collection Time: 12/06/16 10:36 AM  Result Value Ref Range   Creatinine, Ser 0.98 0.61 - 1.24 mg/dL   GFR calc non Af Amer >60 >60 mL/min   GFR calc Af Amer >60 >60 mL/min    Comment: (NOTE) The eGFR has been calculated using the CKD EPI equation. This calculation has not been validated in all clinical situations. eGFR's persistently <60 mL/min signify possible Chronic Kidney Disease.   Phenytoin level, total     Status: Abnormal    Collection Time: 12/06/16 10:36 AM  Result Value Ref Range   Phenytoin Lvl 6.3 (L) 10.0 - 20.0 ug/mL  Strep pneumoniae urinary antigen     Status: None   Collection Time: 12/06/16  1:31 PM  Result Value Ref Range   Strep Pneumo Urinary Antigen NEGATIVE NEGATIVE    Comment: Performed at [facility]        Infection due to S. pneumoniae cannot be absolutely ruled out since the antigen present may be below the detection limit of  the test. Performed at [facility]   CBC     Status: Abnormal   Collection Time: 12/07/16  8:35 AM  Result Value Ref Range   WBC 5.1 4.0 - 10.5 K/uL   RBC 4.58 4.22 - 5.81 MIL/uL   Hemoglobin 14.3 13.0 - 17.0 g/dL   HCT 39.9 39.0 - 52.0 %   MCV 87.1 78.0 - 100.0 fL   MCH 31.2 26.0 - 34.0 pg   MCHC 35.8 30.0 - 36.0 g/dL   RDW 15.9 (H) 11.5 - 15.5 %   Platelets 155 150 - 400 K/uL  Basic metabolic panel     Status: Abnormal   Collection Time: 12/07/16  8:35 AM  Result Value Ref Range   Sodium 137 135 - 145 mmol/L   Potassium 4.2 3.5 - 5.1 mmol/L   Chloride 107 101 - 111 mmol/L   CO2 24 22 - 32 mmol/L   Glucose, Bld 115 (H) 65 - 99 mg/dL   BUN 10 6 - 20 mg/dL   Creatinine, Ser 0.98 0.61 - 1.24 mg/dL   Calcium 7.7 (L) 8.9 - 10.3 mg/dL   GFR calc non Af Amer >60 >60 mL/min   GFR calc Af Amer >60 >60 mL/min    Comment: (NOTE) The eGFR has been calculated using the CKD EPI equation. This calculation has not been validated in all clinical situations. eGFR's persistently <60 mL/min signify possible Chronic Kidney Disease.    Anion gap 6 5 - 15  Respiratory Panel by PCR     Status: Abnormal   Collection Time: 12/07/16  1:00 PM  Result Value Ref Range   Adenovirus NOT DETECTED NOT DETECTED   Coronavirus 229E NOT DETECTED NOT DETECTED   Coronavirus HKU1 NOT DETECTED NOT DETECTED   Coronavirus NL63 NOT DETECTED NOT DETECTED   Coronavirus OC43 NOT DETECTED NOT DETECTED   Metapneumovirus DETECTED (A) NOT DETECTED   Rhinovirus /  Enterovirus NOT DETECTED NOT DETECTED   Influenza A NOT DETECTED NOT DETECTED   Influenza B NOT DETECTED NOT DETECTED   Parainfluenza Virus 1 NOT DETECTED NOT DETECTED   Parainfluenza Virus 2 NOT DETECTED NOT DETECTED   Parainfluenza Virus 3 NOT DETECTED NOT DETECTED   Parainfluenza Virus 4 NOT DETECTED NOT DETECTED   Respiratory Syncytial Virus NOT DETECTED NOT DETECTED   Bordetella pertussis NOT DETECTED NOT DETECTED   Chlamydophila pneumoniae NOT DETECTED NOT DETECTED   Mycoplasma pneumoniae NOT DETECTED NOT DETECTED    Comment: Performed at [facility]  Culture, sputum-assessment     Status: None   Collection Time: 12/07/16  1:04 PM  Result Value Ref Range   Specimen Description SPUTUM    Special Requests NONE    Sputum evaluation THIS SPECIMEN IS ACCEPTABLE FOR SPUTUM CULTURE    Report Status 12/07/2016 FINAL   Culture, respiratory (NON-Expectorated)     Status: None   Collection Time: 12/07/16  1:04 PM  Result Value Ref Range   Specimen Description SPUTUM    Special Requests NONE Reflexed from [id number]    Gram Stain      MODERATE WBC PRESENT, PREDOMINANTLY PMN NO ORGANISMS SEEN    Culture      Consistent with normal respiratory flora. Performed at [facility]    Report Status 12/09/2016 FINAL   CBC     Status: Abnormal   Collection Time: 12/08/16  6:13 AM  Result Value Ref Range   WBC 6.0 4.0 - 10.5 K/uL   RBC 4.65 4.22 - 5.81  MIL/uL   Hemoglobin 13.9 13.0 - 17.0 g/dL   HCT 41.1 39.0 - 52.0 %   MCV 88.4 78.0 - 100.0 fL   MCH 29.9 26.0 - 34.0 pg   MCHC 33.8 30.0 - 36.0 g/dL   RDW 16.1 (H) 11.5 - 15.5 %   Platelets 160 150 - 400 K/uL  Basic metabolic panel     Status: Abnormal   Collection Time: 12/08/16  6:13 AM  Result Value Ref Range   Sodium 140 135 - 145 mmol/L   Potassium 4.0 3.5 - 5.1 mmol/L   Chloride 109 101 - 111 mmol/L   CO2 24 22 - 32 mmol/L   Glucose, Bld 90 65 - 99 mg/dL   BUN 11 6 - 20 mg/dL   Creatinine, Ser 0.91 0.61 - 1.24  mg/dL   Calcium 8.1 (L) 8.9 - 10.3 mg/dL   GFR calc non Af Amer >60 >60 mL/min   GFR calc Af Amer >60 >60 mL/min    Comment: (NOTE) The eGFR has been calculated using the CKD EPI equation. This calculation has not been validated in all clinical situations. eGFR's persistently <60 mL/min signify possible Chronic Kidney Disease.    Anion gap 7 5 - 15  CBC     Status: None   Collection Time: 12/09/16  5:36 AM  Result Value Ref Range   WBC 6.8 4.0 - 10.5 K/uL   RBC 4.82 4.22 - 5.81 MIL/uL   Hemoglobin 14.7 13.0 - 17.0 g/dL   HCT 42.3 39.0 - 52.0 %   MCV 87.8 78.0 - 100.0 fL   MCH 30.5 26.0 - 34.0 pg   MCHC 34.8 30.0 - 36.0 g/dL   RDW 15.5 11.5 - 15.5 %   Platelets 180 150 - 400 K/uL  Basic metabolic panel     Status: Abnormal   Collection Time: 12/09/16  5:36 AM  Result Value Ref Range   Sodium 138 135 - 145 mmol/L   Potassium 4.3 3.5 - 5.1 mmol/L   Chloride 106 101 - 111 mmol/L   CO2 26 22 - 32 mmol/L   Glucose, Bld 106 (H) 65 - 99 mg/dL   BUN 14 6 - 20 mg/dL   Creatinine, Ser 0.89 0.61 - 1.24 mg/dL   Calcium 8.4 (L) 8.9 - 10.3 mg/dL   GFR calc non Af Amer >60 >60 mL/min   GFR calc Af Amer >60 >60 mL/min    Comment: (NOTE) The eGFR has been calculated using the CKD EPI equation. This calculation has not been validated in all clinical situations. eGFR's persistently <60 mL/min signify possible Chronic Kidney Disease.    Anion gap 6 5 - 15      Constitutional:  BP 140/86 (BP Location: Left Arm, Patient Position: Sitting, Cuff Size: Normal) Comment: States takes BP medication at night  Pulse (!) 108   Ht 5' 11"  (1.803 m)   Wt 211 lb 6.4 oz (95.9 kg)   BMI 29.48 kg/m    Musculoskeletal: Strength & Muscle Tone: within normal limits Gait & Station: normal Patient leans: N/A  Psychiatric Specialty Exam: General Appearance: Casual and Guarded  Eye Contact::  Fair  Speech:  Slow  Volume:  Normal  Mood:  Anxious  Affect:  Constricted  Thought Process:   Descriptions of Associations: Circumstantial  Orientation:  Full (Time, Place, and Person)  Thought Content:  Rumination Perceptions: denies AH/VH  Suicidal Thoughts:  No  Homicidal Thoughts:  No  Memory:  Immediate;   Fair Recent;  Fair Remote;   Fair  Judgement:  Fair  Insight:  Fair  Psychomotor Activity:  Normal  Concentration:  Fair  Recall:  [employer] of Knowledge:  Fair  Language:  [name]  Akathisia:  No  Handed:  Right  AIMS (if indicated):     Assets:  Communication Skills Desire for Improvement Housing  ADL's:  Intact  Cognition:  Impaired,  Mild  Sleep:   fair   Established Problem, Stable/Improving (1), Review or order clinical lab tests (1), Review of Last Therapy Session (1) and Review of Medication Regimen & Side Effects (2)  Assessment: DEKE TILGHMAN is a 61 year old man with diagnoses of schizoaffective disorder, major depressive disorder with psychosis and anxiety disorder came for her follow-up appointment.  Plan:  I reviewed records from his recent hospitalization on a medical floor.  He is no longer taking gabapentin.  Though he still have paranoia but his symptoms are stable.  I will continue Amitriptyline 200 mg at bedtime (EKG QTc 458 msec) Patient will see his PCP in a few weeks; advised patient to recheck EKG), continue Seroquel 600 mg at bedtime and Klonopin 1 mg 3 times a day.  He is seeing Tharon Aquas for counseling.  Encouraged to continue counseling.  Discussed medication side effects and benefits.  Recommended to call us back if he has any question, concern if he feel worsening of the symptom.  Follow-up in 3 months.  Discuss safety plan that anytime having active suicidal thoughts or homicidal thoughts and he need to call 911 or go to the local emergency room.  Jakeisha Stricker T., MD 01/01/2017

## 2017-01-02 ENCOUNTER — Other Ambulatory Visit: Payer: Self-pay | Admitting: Family Medicine

## 2017-01-08 ENCOUNTER — Other Ambulatory Visit: Payer: Self-pay | Admitting: Family Medicine

## 2017-01-16 ENCOUNTER — Ambulatory Visit (HOSPITAL_COMMUNITY): Payer: Self-pay | Admitting: Psychiatry

## 2017-01-21 ENCOUNTER — Ambulatory Visit (INDEPENDENT_AMBULATORY_CARE_PROVIDER_SITE_OTHER): Payer: Medicaid Other | Admitting: Clinical

## 2017-01-21 DIAGNOSIS — F251 Schizoaffective disorder, depressive type: Secondary | ICD-10-CM | POA: Diagnosis not present

## 2017-01-21 NOTE — Progress Notes (Signed)
   THERAPIST PROGRESS NOTE  Session Time: 12:12 - 1:08  Participation Level: Active  Behavioral Response: CasualAlertAnxious and Depressed  Type of Therapy: Individual Therapy  Treatment Goals addressed: improve psychiatric symptoms, Elevate mood, improve faulty thinking,   Interventions: CBT and Motivational Interviewing  Summary: [name] is a 61 y.o male who presents with Schizoaffective disorder, depressive type. .   Suicidal/Homicidal: No -without intent/plan  Therapist Response: Johanna met clinician for an individual session. [name] discussed his psychiatric symptoms and his current life events. [name] shared he was experiencing a lot of depression and emotional numbness. He shared that he has not been able to accomplish any of the things he has been making an effort on - no daily tasks, no exercise, no showering. Clinician asked open ended questions and [name] shared about the spiritual experience he had as a young adult and not only did change his life but that no one understands. The not being understood was depressing to him. Clinician asked him open ended questions about his experience and how it relates to what is going on now. He shared his thoughts and insights. He asked if clinician thought someone could experience something like that and get better. Clinician said yes, she said she didn't think someone would be the same, there are lots of experiences that change [place] forever, but she did think that someone could have an experience like that and still have a good life. Clinician asked open ended questions of [name]. He shared an insight that his girlfriend did not understand this experience but she did understand his depression and this has helped him to not feel so lonely. Client and clinician discussed how this was an improvement.  Plan: Return again in 1-2 weeks.  Diagnosis: Axis I: Schizoaffective disorder, depressive type.    Lennyn Gange A, LCSW 01/21/2017

## 2017-01-22 ENCOUNTER — Ambulatory Visit (INDEPENDENT_AMBULATORY_CARE_PROVIDER_SITE_OTHER): Payer: Medicaid Other | Admitting: Family Medicine

## 2017-01-22 VITALS — BP 122/86 | HR 82 | Temp 97.8°F | Wt 205.0 lb

## 2017-01-22 DIAGNOSIS — Z114 Encounter for screening for human immunodeficiency virus [HIV]: Secondary | ICD-10-CM | POA: Diagnosis not present

## 2017-01-22 DIAGNOSIS — E291 Testicular hypofunction: Secondary | ICD-10-CM | POA: Diagnosis not present

## 2017-01-22 DIAGNOSIS — K5909 Other constipation: Secondary | ICD-10-CM | POA: Diagnosis not present

## 2017-01-22 DIAGNOSIS — Z23 Encounter for immunization: Secondary | ICD-10-CM | POA: Diagnosis not present

## 2017-01-22 DIAGNOSIS — I10 Essential (primary) hypertension: Secondary | ICD-10-CM

## 2017-01-22 DIAGNOSIS — E785 Hyperlipidemia, unspecified: Secondary | ICD-10-CM

## 2017-01-22 MED ORDER — HYDROCHLOROTHIAZIDE 25 MG PO TABS: ORAL_TABLET | ORAL | 1 refills | Status: DC

## 2017-01-22 NOTE — Patient Instructions (Signed)
Flu shot today  Increase HCTZ to 25mg  daily - sent in a new prescription for you for this  I don't think you need to see the lung doctor now Let me know if you have more wheezing or you have shortness of breath   On your way out, schedule an appointment one morning to come back for fasting labs. Do not eat or drink anything other than water the morning of your lab appointment until after your labs are drawn. Make it in 1-2 weeks Will check cholesterol, kidneys, and HIV test  Monitor your blood pressure at home and let me know if it gets below 110/60 or over 150/90  Be well, Dr. Ardelia Mems

## 2017-01-22 NOTE — Progress Notes (Signed)
Date of Visit: 01/22/2017   HPI:  Patient presents for hospital follow up.  Was hospitalized from 12/06/16 to 12/09/16 with sepsis secondary to presumed pneumonia, notably wheezing and with O2 requirement while hospitalized. o2 was weaned prior to discharge, completed antibiotics course while in hospital. Patient now reports he is feeling well. No shortness of breath or chest pain. He says he "wheezes" sometimes, but on examination and talking with him more, this is a volitional upper airway sound that he makes on command with forced expiration. No wheezing with routine breathing. Overall he is doin gwell.  Hypertension - he is concerned about his blood pressure, as when he checks it at home in the evening it tends to run high (150s-160s/100s). It's normal in the day. Currently taking metoprolol succinate 50mg  at night, HCTZ 12.5mg  daily.  Constipation - well controlled, takes miralax daily with good bowel movement. No rectal bleeding.  ROS: See HPI.  [place]: history of hyperlipidemia, seizure disorder, low T, constipation, schizoaffective disorder, hypertension,   PHYSICAL EXAM: BP 122/86   Pulse 82   Temp 97.8 F (36.6 C) (Oral)   Wt 205 lb (93 kg)   SpO2 94%   BMI 28.59 kg/m  Gen: NAD, pleasant, cooperative, well appearing HEENT: normocephalic, atraumatic, moist mucous membranes  Heart: regular rate and rhythm, no murmur Lungs: clear to auscultation bilaterally. Able to create loud "wheeze" sound volitionally with strongly forced expiration, which is loudest in the upper airway (not able to hear this in his lungs).  Normal work of breathing  Neuro: alert, grossly nonfocal, speech normal Ext: No appreciable lower extremity edema bilaterally   ASSESSMENT/PLAN:  Health maintenance:  -flu shot given today  -will discuss shingrix at next visit -check HIV antibody at lab visit for routine screening  Hyperlipidemia Check lipids at fasting lab visit, titrate statin as needed based  on results.  Androgen deficiency This is now being managed by urology.  Constipation, chronic Well controlled. Continue current regimen.   Essential hypertension Well controlled here, but patient concerned about elevated PM readings at home. Will increase HCTZ to 25mg  daily. Follow up in 1-2 weeks for labs. Patient will continue monitoring blood pressure at home.  Hospital follow up  As patient well appearing now and is asymptomatic and this seems to have been his first episode of wheezing in the context of a respriatory virus, I discussed with him that I don't think he needs pulmonology referral right now. He has no wheeze on exam, just a volitional upper airway sound that is not present unless patient specifically tries to create it. Continue to monitor. Patient agreeable with this plan.  FOLLOW UP: Follow up in 1-2 weeks for lab visit  [place] J. Ardelia Mems, [place]

## 2017-01-23 ENCOUNTER — Encounter (HOSPITAL_COMMUNITY): Payer: Self-pay | Admitting: Clinical

## 2017-01-24 NOTE — Assessment & Plan Note (Addendum)
Well controlled here, but patient concerned about elevated PM readings at home. Will increase HCTZ to 25mg  daily. Follow up in 1-2 weeks for labs. Patient will continue monitoring blood pressure at home.

## 2017-01-24 NOTE — Assessment & Plan Note (Signed)
Check lipids at fasting lab visit, titrate statin as needed based on results.

## 2017-01-24 NOTE — Assessment & Plan Note (Signed)
This is now being managed by urology.

## 2017-01-24 NOTE — Assessment & Plan Note (Signed)
Well-controlled.  Continue current regimen. 

## 2017-01-31 ENCOUNTER — Telehealth: Payer: Self-pay | Admitting: Family Medicine

## 2017-01-31 NOTE — Telephone Encounter (Signed)
Is running a low grade fever, weak, nausead, coughing. Thinks he has another respiratory infection. He doesn't know what he should do.  He doesn't think he has the flu

## 2017-01-31 NOTE — Telephone Encounter (Signed)
Received call on after hours emergency line. Patient said that he had been having URI symptoms including cough, subjective fevers, and nausea for the past 3 days. I advised patient that it was difficult to make an assessment over the phone. Recommended that he schedule an appointment with the [facility] in the morning if his symptoms persisted. Discussed reasons to seek care sooner including fever, chest pain, shortness of breath, etc. Patient voiced understanding and had no further questions.  Algis Greenhouse. Jerline Pain, [place] Resident PGY-3 01/31/2017 11:05 PM

## 2017-02-01 NOTE — Telephone Encounter (Signed)
Agree. Leydi Winstead J Cortlin Marano, MD  

## 2017-02-01 NOTE — Telephone Encounter (Signed)
Left voice message for patient to take Tylenol or Ibuprofen for the fever, make sure he keep well hydrated and try over the counter cough medication for the cough.  No appointments at [facility] today; however patient could walk-in and speak with triage nurse to see if he could be fit into a provider's schedule this afternoon.  Will forward to PCP.  Derl Barrow, RN

## 2017-02-18 ENCOUNTER — Other Ambulatory Visit: Payer: Medicaid Other

## 2017-02-18 ENCOUNTER — Encounter (HOSPITAL_COMMUNITY): Payer: Self-pay | Admitting: Clinical

## 2017-02-18 ENCOUNTER — Ambulatory Visit (INDEPENDENT_AMBULATORY_CARE_PROVIDER_SITE_OTHER): Payer: Medicaid Other | Admitting: Clinical

## 2017-02-18 DIAGNOSIS — F251 Schizoaffective disorder, depressive type: Secondary | ICD-10-CM

## 2017-02-18 DIAGNOSIS — E291 Testicular hypofunction: Secondary | ICD-10-CM

## 2017-02-18 DIAGNOSIS — Z114 Encounter for screening for human immunodeficiency virus [HIV]: Secondary | ICD-10-CM

## 2017-02-18 DIAGNOSIS — E785 Hyperlipidemia, unspecified: Secondary | ICD-10-CM

## 2017-02-18 LAB — BASIC METABOLIC PANEL WITH GFR
BUN: 21 mg/dL (ref 7–25)
CO2: 22 mmol/L (ref 20–31)
Calcium: 9.3 mg/dL (ref 8.6–10.3)
Chloride: 100 mmol/L (ref 98–110)
Creat: 0.87 mg/dL (ref 0.70–1.25)
GFR, Est African American: >89 mL/min (ref >=60)
GFR, Est Non African American: >89 mL/min (ref >=60)
Glucose, Bld: 100 mg/dL — ABNORMAL HIGH (ref 65–99)
Potassium: 4.1 mmol/L (ref 3.5–5.3)
Sodium: 136 mmol/L (ref 135–146)

## 2017-02-18 LAB — LIPID PANEL
Cholesterol: 183 mg/dL (ref <200)
HDL: 75 mg/dL (ref >40)
LDL Cholesterol: 80 mg/dL (ref <100)
Total CHOL/HDL Ratio: 2.4 Ratio (ref <5.0)
Triglycerides: 138 mg/dL (ref <150)
VLDL: 28 mg/dL (ref <30)

## 2017-02-18 NOTE — Progress Notes (Signed)
   THERAPIST PROGRESS NOTE  Session Time: 3:30 - 4:15  Participation Level: Active  Behavioral Response: CasualAlertDepressed  Type of Therapy: Individual Therapy  Treatment Goals addressed: improve psychiatric symptoms, Elevate mood, improve faulty thinking,   Interventions: CBT and Motivational Interviewing, grounding techniques  Summary: [name] is a 61 y.o male who presents with Schizoaffective disorder, depressive type. .   Suicidal/Homicidal: No -without intent/plan  Therapist Response: [name] met clinician for an individual session. [name] discussed his psychiatric symptoms and  his current life events. [name] shared that he has been symptomatic, but that he feels things are improving. He shared he has continued to hear the names of deities  And mantras in his head. He shared that he believes his feelings of numbness are protecting him. Clinician asked open ended questions and [name] shared that he was suffering from this experience because it was creating a kind of cleanse that brought up past pain and Karmic debt. He shared the experience is overwhelming. He denied any current suicidal or homicidal ideation. [name] shared when the experience was more intense (when alone) and less intense ( when interacting or doing something). Client and clinician discussed the kinds of activizes that were likely to distract from or dull the experience of the mantras and deity names. Client and clinician discussed grounding techniques. Client and clinician practiced some of the techniques together.  Plan: Return again in 1-2 weeks.  Diagnosis: Axis I: Schizoaffective disorder, depressive type.    [name] A, LCSW 02/18/2017

## 2017-02-19 LAB — HIV ANTIBODY (ROUTINE TESTING W REFLEX): HIV 1&2 Ab, 4th Generation: NONREACTIVE

## 2017-02-19 LAB — TESTOSTERONE: Testosterone: 22 ng/dL — ABNORMAL LOW (ref 250–827)

## 2017-02-22 ENCOUNTER — Encounter: Payer: Self-pay | Admitting: Family Medicine

## 2017-02-26 ENCOUNTER — Other Ambulatory Visit: Payer: Self-pay | Admitting: Family Medicine

## 2017-02-26 DIAGNOSIS — E291 Testicular hypofunction: Secondary | ICD-10-CM

## 2017-02-26 NOTE — Telephone Encounter (Signed)
Pt is calling for a refill on his Dilantin and Testosterone. jw

## 2017-02-27 ENCOUNTER — Other Ambulatory Visit: Payer: Self-pay | Admitting: Family Medicine

## 2017-02-27 DIAGNOSIS — E291 Testicular hypofunction: Secondary | ICD-10-CM

## 2017-02-27 MED ORDER — POLYETHYLENE GLYCOL 3350 17 GM/SCOOP PO POWD: ORAL | 4 refills | Status: DC

## 2017-02-27 NOTE — Telephone Encounter (Signed)
Pt is calling to check the status of his refill request and also ask for a refill on his polyethylene glycol powder. He would like to have the larger size of this since he uses this a lot. jw

## 2017-02-27 NOTE — Telephone Encounter (Signed)
Please inform patient that: -I will send in more miralax for him. -Urologist is now managing his testosterone. Refills must come from [facility]. -Neurologist manages his seizure disorder. They should refill dilantin.  Leeanne Rio, MD

## 2017-02-28 NOTE — Telephone Encounter (Signed)
Pt informed. Kateryn Marasigan, CMA  

## 2017-03-01 ENCOUNTER — Telehealth: Payer: Self-pay | Admitting: Neurology

## 2017-03-01 ENCOUNTER — Other Ambulatory Visit: Payer: Self-pay | Admitting: Family Medicine

## 2017-03-01 DIAGNOSIS — E291 Testicular hypofunction: Secondary | ICD-10-CM

## 2017-03-01 MED ORDER — PHENYTOIN SODIUM EXTENDED 100 MG PO CAPS: ORAL_CAPSULE | ORAL | 3 refills | Status: DC

## 2017-03-01 MED ORDER — PHENYTOIN 50 MG PO CHEW: 50.0000 mg | CHEWABLE_TABLET | Freq: Every day | ORAL | 3 refills | Status: DC

## 2017-03-01 NOTE — Telephone Encounter (Signed)
The patient called, he needs a refill on the dilantin. I will refill.

## 2017-03-14 ENCOUNTER — Ambulatory Visit (HOSPITAL_COMMUNITY): Payer: Self-pay | Admitting: Clinical

## 2017-03-28 ENCOUNTER — Ambulatory Visit (INDEPENDENT_AMBULATORY_CARE_PROVIDER_SITE_OTHER): Payer: Medicaid Other | Admitting: Psychiatry

## 2017-03-28 ENCOUNTER — Encounter (HOSPITAL_COMMUNITY): Payer: Self-pay | Admitting: Psychiatry

## 2017-03-28 DIAGNOSIS — Z79899 Other long term (current) drug therapy: Secondary | ICD-10-CM | POA: Diagnosis not present

## 2017-03-28 DIAGNOSIS — Z7982 Long term (current) use of aspirin: Secondary | ICD-10-CM | POA: Diagnosis not present

## 2017-03-28 DIAGNOSIS — F419 Anxiety disorder, unspecified: Secondary | ICD-10-CM

## 2017-03-28 DIAGNOSIS — Z811 Family history of alcohol abuse and dependence: Secondary | ICD-10-CM

## 2017-03-28 DIAGNOSIS — F251 Schizoaffective disorder, depressive type: Secondary | ICD-10-CM | POA: Diagnosis not present

## 2017-03-28 MED ORDER — AMITRIPTYLINE HCL 100 MG PO TABS: 200.0000 mg | ORAL_TABLET | Freq: Every day | ORAL | 2 refills | Status: DC

## 2017-03-28 MED ORDER — CLONAZEPAM 1 MG PO TABS: 1.0000 mg | ORAL_TABLET | Freq: Three times a day (TID) | ORAL | 2 refills | Status: DC

## 2017-03-28 MED ORDER — QUETIAPINE FUMARATE 300 MG PO TABS: 600.0000 mg | ORAL_TABLET | Freq: Every day | ORAL | 2 refills | Status: DC

## 2017-03-28 NOTE — Progress Notes (Signed)
BH MD/PA/NP OP Progress Note  03/28/2017 4:02 PM [name]  MRN:  [id number]  Chief Complaint:  Subjective:  I'm sleeping better.  I'm taking the medication.  They're helping me.  HPI: [name] came for his follow-up appointment.  He is taking his medication and is sleeping better.  He continues to talk about mantra, paranoia and unknown sources that follows him but he could not able to explain very well.  He endorsed that he has these symptoms for many years and he may have to live with it but he is handling much better.  Sometime he gets scared because his brother may break into his house but he also realized that he has not spoken to his brother for many years and there has been no recent contact.  Though he feel paranoia but it is well controlled.  He feels the medicine helping his sleep and paranoia.  He denies any irritability, anger, mania or any crying spells.  He denies any side effects or any suicidal thoughts.  He lives by himself.  He has limited social network.  He is seeing Tharon Aquas for counseling and is been compliant with therapy.  Patient denies drinking alcohol or using any illegal substances.  He does not want to change his medication.  He is taking amitriptyline, Klonopin and Seroquel.  Visit Diagnosis:    ICD-9-CM ICD-10-CM   1. Schizoaffective disorder, depressive type with good prognostic features (HCC) 295.70 F25.1 clonazePAM (KLONOPIN) 1 MG tablet     amitriptyline (ELAVIL) 100 MG tablet     QUEtiapine (SEROQUEL) 300 MG tablet    Past Psychiatric History: Reviewed. Patient has severe depression since his college.  He's been taking medication since 1984.  He has at least 15 psychiatric hospitalization due to severe depression, paranoia and suicidal thoughts.  His last psychiatric hospitalization was at behavioral Center where he was admitted from 07/13/2015 to 07/21/2015.  He was thinking to asphyxiate on helium.  At that time he was taking Seroquel 900 mg at bedtime,  amitriptyline 400 mg at bedtime and Klonopin 2 mg 4 times a day.  He had one history of suicidal attempt when he cut his testicle because he does not want to be depressed.  He admitted history of paranoia and negative thinking.  He was seeing at [name] at [place] however it closed he moved to [name] to continue with [name].  He had tried numerous SSRIs however he always had opposite reaction with the SSRI.  He felt poor sleep, restlessness, irritability and unable to tolerate SSRIs.  In the past she had tried nortriptyline, Haldol, Geodon, Risperdal, lithium, Prozac, Wellbutrin , Paxil, Ativan, Lexapro and Zoloft.  He denies any history of mania.  Patient denies any history of drugs or alcohol use.  Past Medical History:  Past Medical History:  Diagnosis Date  . Anxiety   . Constipation   . Depression   . Dyslipidemia   . GERD (gastroesophageal reflux disease)   . Hearing loss   . Hypertension   . Memory loss   . MRSA carrier   . Schizoaffective disorder   . Seizures ([place])     Past Surgical History:  Procedure Laterality Date  . CARPAL TUNNEL RELEASE    . COLONOSCOPY  2010  . ORIF SHOULDER FRACTURE    . self orchectomy    . SHOULDER CLOSED REDUCTION Left 09/16/2013   Procedure: CLOSED REDUCTION SHOULDER;  Surgeon: Mauri Pole, MD;  Location: [facility];  Service: Orthopedics;  Laterality: Left;  . SHOULDER HEMI-ARTHROPLASTY Left 09/18/2013   Procedure: LEFT SHOULDER HEMI-ARTHROPLASTY;  Surgeon: Augustin Schooling, MD;  Location: [facility];  Service: Orthopedics;  Laterality: Left;    Family Psychiatric History: Reviewed.  Family History:  Family History  Problem Relation Age of Onset  . Stroke Father     Living at 69  . Stroke Mother     Stroke in late 41's. Died in her 44's  . Alcohol abuse Brother   . Alcohol abuse Brother     Social History:  Social History   Social History  . Marital status: Single    Spouse name: N/A  . Number of  children: 0  . Years of education: 4   Occupational History  . disabled    Social History Main Topics  . Smoking status: Never Smoker  . Smokeless tobacco: Never Used  . Alcohol use No  . Drug use: No  . Sexual activity: Yes    Partners: Female    Birth control/ protection: None   Other Topics Concern  . Not on file   Social History Narrative   Patient does not drink caffeine.   Patient is left handed.    Allergies:  Allergies  Allergen Reactions  . Codeine Nausea And Vomiting  . Sulfa Antibiotics Nausea And Vomiting    Metabolic Disorder Labs: Lab Results  Component Value Date   HGBA1C 5.6 06/11/2016   MPG 111 07/30/2015   MPG 123 07/13/2015   Lab Results  Component Value Date   PROLACTIN 12.0 07/13/2015   Lab Results  Component Value Date   CHOL 183 02/18/2017   TRIG 138 02/18/2017   HDL 75 02/18/2017   CHOLHDL 2.4 02/18/2017   VLDL 28 02/18/2017   LDLCALC 80 02/18/2017   LDLCALC 62 07/30/2015     Current Medications: Current Outpatient Prescriptions  Medication Sig Dispense Refill  . amitriptyline (ELAVIL) 100 MG tablet Take 2 tablets (200 mg total) by mouth at bedtime. 60 tablet 2  . aspirin EC 81 MG tablet Take 1 tablet (81 mg total) by mouth daily.    . clonazePAM (KLONOPIN) 1 MG tablet Take 1 tablet (1 mg total) by mouth 3 (three) times daily. 90 tablet 2  . [facility] SENNA PLUS 8.6-50 MG tablet TAKE 1 TABLET BY MOUTH AT BEDTIME AS NEEDED FOR MILD CONSTIPATION 30 tablet 3  . hydrochlorothiazide (HYDRODIURIL) 25 MG tablet TAKE 1 TABLET (12.5 MG TOTAL) BY MOUTH DAILY. 30 tablet 1  . metoprolol succinate (TOPROL-XL) 50 MG 24 hr tablet TAKE 1 TABLET BY MOUTH AT BEDTIME WITH OR IMMEDIATELY FOLLOWING A MEAL 30 tablet 5  . Multiple Vitamin (MULTIVITAMIN WITH MINERALS) TABS tablet Take 1 tablet by mouth daily.    . phenytoin (DILANTIN) 100 MG ER capsule TAKE 2 CAPSULES BY MOUTH AT NIGHT 180 capsule 3  . phenytoin (DILANTIN) 50 MG tablet Chew 1 tablet (50 mg  total) by mouth at bedtime. 90 tablet 3  . polyethylene glycol powder (GLYCOLAX/MIRALAX) powder TAKE 17 GRAMS 2 TIMES A DAY AS NEEDED FOR CONSTIPATION. MIX INTO 8 OF FLUID AND DRINK 850 g 4  . QUEtiapine (SEROQUEL) 300 MG tablet Take 2 tablets (600 mg total) by mouth at bedtime. 60 tablet 2  . simvastatin (ZOCOR) 40 MG tablet TAKE 1 TABLET (40 MG TOTAL) BY MOUTH AT BEDTIME. 30 tablet 5  . testosterone cypionate (DEPOTESTOSTERONE CYPIONATE) 200 MG/ML injection Inject 0.75 mLs (150 mg total) into the muscle every 14 (fourteen)  days.    . VIMPAT 150 MG TABS TAKE 1 TABLET BY MOUTH TWICE A DAY 60 tablet 5   No current facility-administered medications for this visit.     Neurologic: Headache: No Seizure: h/o seizure Paresthesias: No  Musculoskeletal: Strength & Muscle Tone: within normal limits Gait & Station: normal Patient leans: N/A  Psychiatric Specialty Exam: ROS  Blood pressure 131/81, pulse 98, height 5\' 11"  (1.803 m), weight 215 lb (97.5 kg).Body mass index is 29.99 kg/m.  General Appearance: Disheveled  Eye Contact:  Fair  Speech:  Slow  Volume:  Normal  Mood:  Anxious  Affect:  Constricted  Thought Process:  Descriptions of Associations: Circumstantial  Orientation:  Full (Time, Place, and Person)  Thought Content: Paranoid Ideation and Rumination   Suicidal Thoughts:  No  Homicidal Thoughts:  No  Memory:  Immediate;   Fair Recent;   Fair Remote;   Fair  Judgement:  Good  Insight:  Good  Psychomotor Activity:  Normal  Concentration:  Concentration: Fair and Attention Span: Fair  Recall:  [employer] of Knowledge: Good  Language: Good  Akathisia:  No  Handed:  Right  AIMS (if indicated):  0  Assets:  Communication Skills Desire for Improvement Housing  ADL's:  Intact  Cognition: WNL  Sleep:  adequate   Assessment: Schizoaffective disorder, depressed type.  Major depressive disorder with psychosis.  Anxiety disorder NOS.  Plan: Patient is still has  residual symptoms of paranoia and depression but symptoms are stable and well controlled with the medication.  Patient does not want to change his medication.  I will continue amitriptyline 200 mg at bedtime and Seroquel 600 mg at bedtime and Klonopin 1 mg 3 times a day.  Encouraged to continue [place] for supportive and CBT.  Discussed medication side effects and recommended to call us back if he has any question, concern or if he feels worsening of the symptom.  Follow-up in 3 months  Jabril Pursell T., MD 03/28/2017, 4:02 PM

## 2017-03-30 ENCOUNTER — Other Ambulatory Visit: Payer: Self-pay | Admitting: Family Medicine

## 2017-05-02 ENCOUNTER — Ambulatory Visit (INDEPENDENT_AMBULATORY_CARE_PROVIDER_SITE_OTHER): Payer: Medicaid Other | Admitting: Family Medicine

## 2017-05-02 ENCOUNTER — Encounter: Payer: Self-pay | Admitting: Family Medicine

## 2017-05-02 DIAGNOSIS — G40909 Epilepsy, unspecified, not intractable, without status epilepticus: Secondary | ICD-10-CM | POA: Diagnosis not present

## 2017-05-02 NOTE — Progress Notes (Signed)
Subjective:     Patient ID: [name], male   DOB: [birth year], 61 y.o.   MRN: [id number]  HPI: 61y.o. Male needs medical clearance for driver's license   Pt with a history of schizoaffective disorder and seizures is here for medical clearance required by the [facility] for fully licensed operation of a motor vehicle. Pt reports not having a seizure in the last 2.5 years. Reports no losses in memory or clarity, falls, changes in vision, or motor vehicle accident in the last 2.5 years. Needs a refill for his Vimpat   Review of Systems  Constitutional: Negative for appetite change, chills, fatigue and fever.  HENT: Negative for congestion, hearing loss, rhinorrhea, sneezing and sore throat.   Eyes: Negative for visual disturbance.  Respiratory: Negative for cough, shortness of breath and wheezing.   Gastrointestinal: Negative for abdominal pain, blood in stool, constipation, diarrhea, nausea and vomiting.  Musculoskeletal: Negative for gait problem.  Neurological: Negative for dizziness, seizures, syncope, facial asymmetry, weakness, light-headedness and headaches.  Psychiatric/Behavioral: Negative for agitation, confusion, decreased concentration, dysphoric mood, sleep disturbance and suicidal ideas.       Objective:   Physical Exam Blood pressure 120/70, pulse 96, temperature 98.4 F (36.9 C), temperature source Oral, height 5\' 11"  (1.803 m), weight 209 lb 9.6 oz (95.1 kg), SpO2 96 %.  Gen: Well appearing HEENT: TM clear bilaterally, PERRL, EOMI Cardio: RRR no murmur, rubs, gallops Resp: CTAB, normal effort Neuro: CN II-XII intact. Muscle bulk and tone normal. Upper and lower extremity strength is full bilaterally. sensation to light touch intact. DTR: Triceps 2+, Biceps 2+, patellar 2+ Achilles 2+  Cerebellar: Normal finger to nose test, normal gait.  Assesment/ Plan:     Medical clearance: Medical clearance forms were given to pcp (Dr. Ardelia Mems) and referral made to opthalmology in  order to complete a portion of the forms.  Vimpat Refill: Patient in need of Vimpat refill. Told to contact his neurologist for refill and management

## 2017-05-02 NOTE — Assessment & Plan Note (Signed)
Patient presents to have DMV paperwork completed. Patient has been seizure free.  -paperwork passed along to PCP Dr. Ardelia Mems for completion -referral made to opthalmology to get formal eye exam (part of DMV paperwork) -patient needs refill of Vimpat, counseled him to contact Neurologist for refill

## 2017-05-02 NOTE — Patient Instructions (Signed)
It was nice to meet you today.  I have given the Department of Motor Vehicles paperwork to Dr. Ardelia Mems. Our office will contact you when this is completed.   I have placed referral to Opthalmology. My office will contact you to schedule this. Please take the [facility] form for the eye doctor to complete.

## 2017-05-06 ENCOUNTER — Other Ambulatory Visit: Payer: Self-pay | Admitting: Adult Health

## 2017-05-06 ENCOUNTER — Other Ambulatory Visit: Payer: Self-pay | Admitting: Neurology

## 2017-05-06 MED ORDER — LACOSAMIDE 150 MG PO TABS: 1.0000 | ORAL_TABLET | Freq: Two times a day (BID) | ORAL | 5 refills | Status: DC

## 2017-05-06 NOTE — Telephone Encounter (Signed)
Pt request refill for VIMPAT 150 MG TABS . Pharmacy [facility]

## 2017-05-07 NOTE — Telephone Encounter (Signed)
Faxed printed/signed rx lacosamide to pt pharmacy. Fax: [phone]. Received confirmation.

## 2017-05-09 ENCOUNTER — Telehealth: Payer: Self-pay | Admitting: Family Medicine

## 2017-05-09 NOTE — Telephone Encounter (Signed)
Dr. Ardelia Mems to complete [facility] paperwork. Will forward to her.

## 2017-05-09 NOTE — Telephone Encounter (Signed)
Pt saw Dr. Ree Kida on the 17th and left DMV paperwork with him. Pt needs to pick this up ASAP. Please call him when its ready. ep

## 2017-05-10 NOTE — Telephone Encounter (Signed)
Forms completed. Will return to [facility]. Will need to be scanned into chart & patient contacted that they are ready.  Thanks Leeanne Rio, MD

## 2017-05-10 NOTE — Telephone Encounter (Signed)
Patient is aware that form is ready for pick up. [name],CMA  

## 2017-06-09 ENCOUNTER — Telehealth: Payer: Self-pay | Admitting: Family Medicine

## 2017-06-09 NOTE — Telephone Encounter (Signed)
**  After Hours/ Emergency Line Call*  Received a call to report that [name] calling to report that his BP was 180/100 at his blood pressure machine at home. He notes that it is not normal for him. He notes that he takes Metoprolol at night before bed and HCTZ during the day. Last night he states he took 2 pills of the Metoprolol just to keep his BP down. Denying headaches, vision changes, chest pain, shortness of breath, edema, lightheadedness, dizziness. He wants to be placed on a higher dose of Metoprolol because he states he used to be on 200mg  daily. Did not recommend that we increase his Metoprolol without seeing him first. Recommended that I schedule an appt for him to be seen tomorrow to discuss his BP, he said he was too busy to come in tomorrow and would call and schedule on his own. Red flags discussed and reasons to go to the emergency department. Will forward to PCP.  Carlyle Dolly, MD PGY-2, [facility] Res[facility]

## 2017-06-11 ENCOUNTER — Ambulatory Visit (HOSPITAL_COMMUNITY): Payer: Self-pay | Admitting: Clinical

## 2017-06-15 ENCOUNTER — Encounter (HOSPITAL_COMMUNITY): Payer: Self-pay

## 2017-06-15 DIAGNOSIS — I10 Essential (primary) hypertension: Secondary | ICD-10-CM | POA: Diagnosis not present

## 2017-06-15 DIAGNOSIS — Z79899 Other long term (current) drug therapy: Secondary | ICD-10-CM | POA: Diagnosis not present

## 2017-06-15 DIAGNOSIS — Z7982 Long term (current) use of aspirin: Secondary | ICD-10-CM | POA: Diagnosis not present

## 2017-06-15 DIAGNOSIS — R51 Headache: Secondary | ICD-10-CM | POA: Diagnosis present

## 2017-06-15 DIAGNOSIS — Z96612 Presence of left artificial shoulder joint: Secondary | ICD-10-CM | POA: Diagnosis not present

## 2017-06-15 NOTE — ED Triage Notes (Signed)
States blood pressure is high at home just took his nightly blood pressure medication and states he wants his blood pressure controlled.

## 2017-06-16 ENCOUNTER — Emergency Department (HOSPITAL_COMMUNITY)
Admission: EM | Admit: 2017-06-16 | Discharge: 2017-06-16 | Disposition: A | Discharge status: Home / Self Care | Payer: Medicaid Other | Attending: Emergency Medicine | Admitting: Emergency Medicine

## 2017-06-16 DIAGNOSIS — I10 Essential (primary) hypertension: Secondary | ICD-10-CM

## 2017-06-16 MED ORDER — METOPROLOL SUCCINATE ER 100 MG PO TB24: 100.0000 mg | ORAL_TABLET | Freq: Every day | ORAL | Status: DC

## 2017-06-16 MED ORDER — LACOSAMIDE 50 MG PO TABS
150.0000 mg | ORAL_TABLET | Freq: Once | ORAL | Status: AC
  Administered 2017-06-16: 150 mg via ORAL
  Filled 2017-06-16: qty 3

## 2017-06-16 MED ORDER — PHENYTOIN 50 MG PO CHEW
50.0000 mg | CHEWABLE_TABLET | Freq: Once | ORAL | Status: AC
  Administered 2017-06-16: 50 mg via ORAL
  Filled 2017-06-16: qty 1

## 2017-06-16 MED ORDER — METOPROLOL SUCCINATE ER 100 MG PO TB24
100.0000 mg | ORAL_TABLET | Freq: Every day | ORAL | Status: DC
  Administered 2017-06-16: 100 mg via ORAL
  Filled 2017-06-16: qty 1

## 2017-06-16 MED ORDER — METOPROLOL SUCCINATE ER 100 MG PO TB24: 100.0000 mg | ORAL_TABLET | Freq: Every day | ORAL | 0 refills | Status: DC

## 2017-06-16 MED ORDER — PHENYTOIN SODIUM EXTENDED 100 MG PO CAPS
200.0000 mg | ORAL_CAPSULE | Freq: Once | ORAL | Status: AC
  Administered 2017-06-16: 200 mg via ORAL
  Filled 2017-06-16: qty 2

## 2017-06-16 NOTE — ED Provider Notes (Signed)
[facility] DEPT Provider Note   CSN: [id number] Arrival date & time: 06/15/17  2134  By signing my name below, I, [name], attest that this documentation has been prepared under the direction and in the presence of [name] Fraise, MD. Electronically Signed: Theresia [name], ED Scribe. 06/16/17. 3:49 AM.  History   Chief Complaint Chief Complaint  Patient presents with  . Hypertension    HPI [name] is a 61 y.o. male.  The history is provided by the patient. No language interpreter was used.  Hypertension  This is a recurrent problem. The current episode started more than 1 week ago. The problem occurs daily (BP becomes high every night). The problem has not changed (pt states his HTN medications wear off at the end of the day) since onset.Associated symptoms include headaches (mild). Pertinent negatives include no chest pain and no shortness of breath. Nothing aggravates the symptoms. The symptoms are relieved by medications.  Pt is requesting his normal dose of his vimpat and dilantin medications that he takes at 10 pm daily.   Past Medical History:  Diagnosis Date  . Anxiety   . Constipation   . Depression   . Dyslipidemia   . GERD (gastroesophageal reflux disease)   . Hearing loss   . Hypertension   . Memory loss   . MRSA carrier   . Schizoaffective disorder   . Seizures [facility])     Patient Active Problem List   Diagnosis Date Noted  . Acute bronchiolitis due to human metapneumovirus   . Mild sleep apnea 06/15/2016  . Actinic keratosis 06/15/2016  . Nocturnal dyspnea 02/03/2016  . Skin lesion of face 02/03/2016  . Generalized weakness 01/17/2016  . Paresthesia of both hands 09/16/2015  . Phenytoin toxicity   . TIA (transient ischemic attack) 07/29/2015  . Vertigo 07/29/2015  . Schizoaffective disorder, depressive type with good prognostic features ([place]) 07/13/2015  . Constipation   . Verruca 05/18/2015  . Seborrheic dermatitis of scalp  05/18/2015  . MDD (major depressive disorder), recurrent severe, without psychosis ([place])   . OCD (obsessive compulsive disorder)   . Sepsis ([place]) 01/02/2015  . Confusion 12/31/2014  . Seizures ([place])   . Schizoaffective disorder, depressive type ([place])   . Orthostatic hypotension   . Falls   . Seizure ([place]) 11/18/2014  . Schizoaffective disorder, unspecified type ([place])   . Essential hypertension   . Overdose of beta-adrenergic antagonist drug 10/22/2014  . Overdose   . Nocturnal enuresis 03/23/2014  . Benzodiazepine dependence, continuous ([place]) 01/27/2014  . Severe major depression ([place]) 01/21/2014  . Frozen shoulder syndrome 09/20/2013  . Somnolence 09/20/2013  . Schizoaffective disorder ([place]) 09/20/2013  . Malaise and fatigue 04/21/2013  . Presbyopia 04/25/2012  . Androgen deficiency 09/07/2011  . Constipation, chronic 09/07/2011  . Other specified disorders of liver 12/30/2008  . Overweight(278.02) 09/08/2008  . Hyperlipidemia 01/28/2008  . Seizure disorder ([place]) 01/28/2008  . OSTEOPENIA 07/01/2007    Past Surgical History:  Procedure Laterality Date  . CARPAL TUNNEL RELEASE    . COLONOSCOPY  2010  . ORIF SHOULDER FRACTURE    . self orchectomy    . SHOULDER CLOSED REDUCTION Left 09/16/2013   Procedure: CLOSED REDUCTION SHOULDER;  Surgeon: Mauri Pole, MD;  Location: [facility];  Service: Orthopedics;  Laterality: Left;  . SHOULDER HEMI-ARTHROPLASTY Left 09/18/2013   Procedure: LEFT SHOULDER HEMI-ARTHROPLASTY;  Surgeon: Augustin Schooling, MD;  Location: [place];  Service: Orthopedics;  Laterality: Left;  Home Medications    Prior to Admission medications   Medication Sig Start Date End Date Taking? Authorizing Provider  amitriptyline (ELAVIL) 100 MG tablet Take 2 tablets (200 mg total) by mouth at bedtime. 03/28/17   Arfeen, Arlyce Harman, MD  aspirin EC 81 MG tablet Take 1 tablet (81 mg total) by mouth daily. 07/21/15   [place], Elyse Jarvis, FNP  clonazePAM (KLONOPIN) 1 MG tablet  Take 1 tablet (1 mg total) by mouth 3 (three) times daily. 03/28/17   Arfeen, Arlyce Harman, MD  [employer] SENNA PLUS 8.6-50 MG tablet TAKE 1 TABLET BY MOUTH AT BEDTIME AS NEEDED FOR MILD CONSTIPATION 08/29/16   Leeanne Rio, MD  hydrochlorothiazide (HYDRODIURIL) 25 MG tablet TAKE 1 TABLET BY MOUTH DAILY 04/03/17   Leeanne Rio, MD  Lacosamide (VIMPAT) 150 MG TABS Take 1 tablet (150 mg total) by mouth 2 (two) times daily. 05/06/17   Kathrynn Ducking, MD  metoprolol succinate (TOPROL-XL) 50 MG 24 hr tablet TAKE 1 TABLET BY MOUTH AT BEDTIME WITH OR IMMEDIATELY FOLLOWING A MEAL 01/07/17   Leeanne Rio, MD  Multiple Vitamin (MULTIVITAMIN WITH MINERALS) TABS tablet Take 1 tablet by mouth daily.    [provider]  phenytoin (DILANTIN) 100 MG ER capsule TAKE 2 CAPSULES BY MOUTH AT NIGHT 03/01/17   Kathrynn Ducking, MD  phenytoin (DILANTIN) 50 MG tablet Chew 1 tablet (50 mg total) by mouth at bedtime. 03/01/17   Kathrynn Ducking, MD  polyethylene glycol powder (GLYCOLAX/MIRALAX) powder TAKE 17 GRAMS 2 TIMES A DAY AS NEEDED FOR CONSTIPATION. MIX INTO 8 OF FLUID AND DRINK 02/27/17   Leeanne Rio, MD  QUEtiapine (SEROQUEL) 300 MG tablet Take 2 tablets (600 mg total) by mouth at bedtime. 03/28/17   Arfeen, Arlyce Harman, MD  simvastatin (ZOCOR) 40 MG tablet TAKE 1 TABLET (40 MG TOTAL) BY MOUTH AT BEDTIME. 02/27/17   Leeanne Rio, MD  testosterone cypionate (DEPOTESTOSTERONE CYPIONATE) 200 MG/ML injection Inject 0.75 mLs (150 mg total) into the muscle every 14 (fourteen) days. 09/12/16   Leeanne Rio, MD    Family History Family History  Problem Relation Age of Onset  . Stroke Father        Living at 39  . Stroke Mother        Stroke in late 8's. Died in her 80's  . Alcohol abuse Brother   . Alcohol abuse Brother     Social History Social History  Substance Use Topics  . Smoking status: Never Smoker  . Smokeless tobacco: Never Used  . Alcohol use No     Allergies     Codeine and Sulfa antibiotics   Review of Systems Review of Systems  Eyes: Negative for visual disturbance.  Respiratory: Negative for shortness of breath.   Cardiovascular: Positive for palpitations (chronic tachycardia). Negative for chest pain.  Neurological: Positive for headaches (mild). Negative for syncope and weakness.  All other systems reviewed and are negative.    Physical Exam Updated Vital Signs BP (!) 152/101 (BP Location: Left Arm)   Pulse (!) 117   Temp 98.7 F (37.1 C) (Oral)   Resp 18   SpO2 98%   Physical Exam CONSTITUTIONAL: Well developed/well nourished HEAD: Normocephalic/atraumatic EYES: EOMI/PERRL ENMT: Mucous membranes moist NECK: supple no meningeal signs SPINE/BACK:entire spine nontender CV: S1/S2 noted, no murmurs/rubs/gallops noted LUNGS: Lungs are clear to auscultation bilaterally, no apparent distress ABDOMEN: soft, nontender, no rebound or guarding, bowel sounds noted throughout abdomen GU:no cva  tenderness NEURO: Pt is awake/alert/appropriate, moves all extremitiesx4.  No facial droop. No arm or leg drift.  EXTREMITIES: pulses normal/equal, full ROM SKIN: warm, color normal PSYCH:anxious   ED Treatments / Results  DIAGNOSTIC STUDIES: Oxygen Saturation is 98% on RA, normal by my interpretation.   COORDINATION OF CARE: 3:33 AM-Discussed next steps with pt including increasing his dosage of HTN medication and follow up with PCP. Pt verbalized understanding and is agreeable with the plan.  Labs (all labs ordered are listed, but only abnormal results are displayed) Labs Reviewed - No data to display  EKG  EKG Interpretation None       Radiology No results found.  Procedures Procedures (including critical care time)  Medications Ordered in ED Medications  metoprolol succinate (TOPROL-XL) 24 hr tablet 100 mg (100 mg Oral Given 06/16/17 0515)  lacosamide (VIMPAT) tablet 150 mg (150 mg Oral Given 06/16/17 0404)  phenytoin  (DILANTIN) ER capsule 200 mg (200 mg Oral Given 06/16/17 0405)  phenytoin (DILANTIN) chewable tablet 50 mg (50 mg Oral Given 06/16/17 0405)     Initial Impression / Assessment and Plan / ED Course  I have reviewed the triage vital signs and the nursing notes.     Pt stable He is concerned for persistent HTN He has been unable to f/u with PCP He requests to start toprol XL at 100mg  This seems reasonable Home AED therapy also given at patient request  At time of discharge, pt resting comfortably No focal weakness No signs of hypertensive emergency He will call family practice for f/u in one week   Final Clinical Impressions(s) / ED Diagnoses   Final diagnoses:  Essential hypertension    New Prescriptions Discharge Medication List as of 06/16/2017  5:52 AM     I personally performed the services described in this documentation, which was scribed in my presence. The recorded information has been reviewed and is accurate.        [name] Fraise, MD 06/16/17 [phone]

## 2017-06-20 NOTE — Progress Notes (Deleted)
   Subjective:   Patient ID: [name]    DOB: 1956-09-11, 61 y.o. male   MRN: [id number]  CC: "***"  HPI: [name] is a 61 y.o. male who presents to clinic today ***. Problems discussed today are as follows:  ***: *** ROS: ***  Complete ROS performed, see HPI for pertinent.  [place]: HTN, orthostatic hypotension, h/o TIA, OSA, MDD, schizoaffective disorder with continual benzo dependance. Smoking status reviewed. Medications reviewed.  Objective:   There were no vitals taken for this visit. Vitals and nursing note reviewed.  General: well nourished, well developed, in no acute distress with non-toxic appearance HEENT: normocephalic, atraumatic, moist mucous membranes Neck: supple, non-tender without lymphadenopathy CV: regular rate and rhythm without murmurs, rubs, or gallops, no lower extremity edema Lungs: clear to auscultation bilaterally with normal work of breathing Abdomen: soft, non-tender, non-distended, no masses or organomegaly palpable, normoactive bowel sounds Skin: warm, dry, no rashes or lesions, cap refill < 2 seconds Extremities: warm and well perfused, normal tone  Assessment & Plan:   No problem-specific Assessment & Plan notes found for this encounter.  No orders of the defined types were placed in this encounter.  No orders of the defined types were placed in this encounter.   Harriet [place], [place], PGY-2 06/20/2017 1:46 PM

## 2017-06-21 ENCOUNTER — Ambulatory Visit: Payer: Medicaid Other | Admitting: Family Medicine

## 2017-06-27 ENCOUNTER — Ambulatory Visit (INDEPENDENT_AMBULATORY_CARE_PROVIDER_SITE_OTHER): Payer: Medicaid Other | Admitting: Psychiatry

## 2017-06-27 ENCOUNTER — Encounter (HOSPITAL_COMMUNITY): Payer: Self-pay | Admitting: Psychiatry

## 2017-06-27 DIAGNOSIS — F251 Schizoaffective disorder, depressive type: Secondary | ICD-10-CM

## 2017-06-27 DIAGNOSIS — Z811 Family history of alcohol abuse and dependence: Secondary | ICD-10-CM

## 2017-06-27 MED ORDER — AMITRIPTYLINE HCL 100 MG PO TABS: 200.0000 mg | ORAL_TABLET | Freq: Every day | ORAL | 2 refills | Status: DC

## 2017-06-27 MED ORDER — CLONAZEPAM 1 MG PO TABS: 1.0000 mg | ORAL_TABLET | Freq: Three times a day (TID) | ORAL | 2 refills | Status: DC

## 2017-06-27 MED ORDER — QUETIAPINE FUMARATE 300 MG PO TABS: 600.0000 mg | ORAL_TABLET | Freq: Every day | ORAL | 2 refills | Status: DC

## 2017-06-27 NOTE — Progress Notes (Signed)
[facility] MD/PA/NP OP Progress Note  06/27/2017 2:01 PM [name]  MRN:  [id number]  Chief Complaint:  Subjective:  I am taking medication.  HPI: [name] came for his follow-up appointment.  He is taking his medication and reported no side effects.  He continues to have chronic paranoia about his brother but it is a stable.  He still talks about mantra, unknown source that follows him but no new complaints or concerns.  Lately his been doing a lot of meditaion and that is helping him a lot.  He does not leave his house because he scared with strangers but denies any irritability, anger, mania, suicidal thoughts or homicidal thought.  He sleeping good with the medication.  He lives by himself.  He has limited social network.  He is tolerating his medication and denies any tremors shakes or any EPS.  He is seeing Tharon Aquas for counseling.  Patient denies drinking alcohol or using any illegal substances.  His appetite is okay.  His energy level is good.  He is taking amitriptyline, Klonopin and Seroquel.  Visit Diagnosis:    ICD-10-CM   1. Schizoaffective disorder, depressive type with good prognostic features (HCC) F25.1 QUEtiapine (SEROQUEL) 300 MG tablet    clonazePAM (KLONOPIN) 1 MG tablet    amitriptyline (ELAVIL) 100 MG tablet    Past Psychiatric History: Reviewed. Patient has depression since his college.  He has at least 15 hospitalization due to severe depression, paranoia and suicidal thoughts.  His last hospitalization was in August 2016.  At that time he was thinking to kill himself by inhaling helium.  He was discharged on Seroquel 900 mg and amitriptyline for milligram at bedtime. He had history of suicidal attempt when he cut his testicle because he does not want to be depressed.  He was seeing [name] at [facility] and then moved to [name].  Patient has multiple trial of SSRIs however he had reaction to SSRIs.  In the past he had tried nortriptyline, Haldol,  Geodon, Seroquel, lithium, Prozac, Wellbutrin, Paxil, Ativan, Lexapro and Zoloft.  Patient denies any history of mania or any drug use.  Past Medical History:  Past Medical History:  Diagnosis Date  . Anxiety   . Constipation   . Depression   . Dyslipidemia   . GERD (gastroesophageal reflux disease)   . Hearing loss   . Hypertension   . Memory loss   . MRSA carrier   . Schizoaffective disorder   . Seizures ([place])     Past Surgical History:  Procedure Laterality Date  . CARPAL TUNNEL RELEASE    . COLONOSCOPY  2010  . ORIF SHOULDER FRACTURE    . self orchectomy    . SHOULDER CLOSED REDUCTION Left 09/16/2013   Procedure: CLOSED REDUCTION SHOULDER;  Surgeon: Mauri Pole, MD;  [facility];  Service: Orthopedics;  Laterality: Left;  . SHOULDER HEMI-ARTHROPLASTY Left 09/18/2013   Procedure: LEFT SHOULDER HEMI-ARTHROPLASTY;  Surgeon: Augustin Schooling, MD;  Location: [facility];  Service: Orthopedics;  Laterality: Left;    Family Psychiatric History: Reviewed.  Family History:  Family History  Problem Relation Age of Onset  . Stroke Father        Living at 55  . Stroke Mother        Stroke in late 69's. Died in her 7's  . Alcohol abuse Brother   . Alcohol abuse Brother     Social History:  Social History   Social History  .  Marital status: Single    Spouse name: N/A  . Number of children: 0  . Years of education: 66   Occupational History  . disabled    Social History Main Topics  . Smoking status: Never Smoker  . Smokeless tobacco: Never Used  . Alcohol use No  . Drug use: No  . Sexual activity: Yes    Partners: Female    Birth control/ protection: None   Other Topics Concern  . Not on file   Social History Narrative   Patient does not drink caffeine.   Patient is left handed.    Allergies:  Allergies  Allergen Reactions  . Codeine Nausea And Vomiting  . Sulfa Antibiotics Nausea And Vomiting    Metabolic Disorder Labs: Lab Results  Component  Value Date   HGBA1C 5.6 06/11/2016   MPG 111 07/30/2015   MPG 123 07/13/2015   Lab Results  Component Value Date   PROLACTIN 12.0 07/13/2015   Lab Results  Component Value Date   CHOL 183 02/18/2017   TRIG 138 02/18/2017   HDL 75 02/18/2017   CHOLHDL 2.4 02/18/2017   VLDL 28 02/18/2017   LDLCALC 80 02/18/2017   LDLCALC 62 07/30/2015     Current Medications: Current Outpatient Prescriptions  Medication Sig Dispense Refill  . amitriptyline (ELAVIL) 100 MG tablet Take 2 tablets (200 mg total) by mouth at bedtime. 60 tablet 2  . aspirin EC 81 MG tablet Take 1 tablet (81 mg total) by mouth daily. (Patient taking differently: Take 81 mg by mouth at bedtime. )    . clonazePAM (KLONOPIN) 1 MG tablet Take 1 tablet (1 mg total) by mouth 3 (three) times daily. 90 tablet 2  . [facility]S SENNA PLUS 8.6-50 MG tablet TAKE 1 TABLET BY MOUTH AT BEDTIME AS NEEDED FOR MILD CONSTIPATION 30 tablet 3  . hydrochlorothiazide (HYDRODIURIL) 25 MG tablet TAKE 1 TABLET BY MOUTH DAILY (Patient taking differently: TAKE 25 MG BY MOUTH EACH MORNING) 90 tablet 3  . Lacosamide (VIMPAT) 150 MG TABS Take 1 tablet (150 mg total) by mouth 2 (two) times daily. 60 tablet 5  . metoprolol succinate (TOPROL-XL) 100 MG 24 hr tablet Take 1 tablet (100 mg total) by mouth daily. 14 tablet 0  . Multiple Vitamin (MULTIVITAMIN WITH MINERALS) TABS tablet Take 1 tablet by mouth daily.    . phenytoin (DILANTIN) 100 MG ER capsule TAKE 2 CAPSULES BY MOUTH AT NIGHT (Patient taking differently: Take 200 mg by mouth at bedtime. ) 180 capsule 3  . phenytoin (DILANTIN) 50 MG tablet Chew 1 tablet (50 mg total) by mouth at bedtime. 90 tablet 3  . polyethylene glycol powder (GLYCOLAX/MIRALAX) powder TAKE 17 GRAMS 2 TIMES A DAY AS NEEDED FOR CONSTIPATION. MIX INTO 8 OF FLUID AND DRINK (Patient taking differently: Take 17 g by mouth daily. ) 850 g 4  . QUEtiapine (SEROQUEL) 300 MG tablet Take 2 tablets (600 mg total) by mouth at bedtime. 60 tablet 2  .  simvastatin (ZOCOR) 40 MG tablet TAKE 1 TABLET (40 MG TOTAL) BY MOUTH AT BEDTIME. 30 tablet 5  . testosterone cypionate (DEPOTESTOSTERONE CYPIONATE) 200 MG/ML injection Inject 0.75 mLs (150 mg total) into the muscle every 14 (fourteen) days.     No current facility-administered medications for this visit.     Neurologic: Headache: No Seizure: history of seizure Paresthesias: No  Musculoskeletal: Strength & Muscle Tone: within normal limits Gait & Station: normal Patient leans: N/A  Psychiatric Specialty Exam: ROS  Blood  pressure 138/86, pulse 99, height 5\' 9"  (1.753 m), weight 208 lb (94.3 kg).Body mass index is 30.72 kg/m.  General Appearance: Disheveled and unshaved  Eye Contact:  Fair  Speech:  Slow  Volume:  Normal  Mood:  Anxious  Affect:  Constricted  Thought Process:  Descriptions of Associations: Circumstantial  Orientation:  Full (Time, Place, and Person)  Thought Content: Rumination and chronic paranoia abouut brother   Suicidal Thoughts:  No  Homicidal Thoughts:  No  Memory:  Immediate;   Fair Recent;   Fair Remote;   Fair  Judgement:  Fair  Insight:  Good  Psychomotor Activity:  Normal  Concentration:  Concentration: Fair and Attention Span: Fair  Recall:  Good  Fund of Knowledge: Good  Language: Good  Akathisia:  No  Handed:  Right  AIMS (if indicated):  0  Assets:  Communication Skills Desire for Improvement Housing Resilience  ADL's:  Intact  Cognition: WNL  Sleep:  fair   Assessment: Schizoaffective disorder bipolar type  Plan: Patient is a stable on his current medication.  Despite chronic paranoia he is handling his symptoms much better.  He does not want to change his medication.  Continue amitriptyline 200 mg at bedtime, Seroquel 600 mg at bedtime and Klonopin 1 mg 3 times a day.  Encouraged to continue seeing Tharon Aquas for counseling.  Recommended to call us back if he has any question, concern or if he feel worsening of the symptom.   Follow-up in 3 months.  Yuriel Lopezmartinez T., MD 06/27/2017, 2:01 PM

## 2017-06-27 NOTE — Addendum Note (Signed)
Addended by: Berniece Andreas T on: 06/27/2017 02:42 PM   Modules accepted: Orders

## 2017-07-01 ENCOUNTER — Ambulatory Visit (INDEPENDENT_AMBULATORY_CARE_PROVIDER_SITE_OTHER): Payer: Medicaid Other | Admitting: Internal Medicine

## 2017-07-01 ENCOUNTER — Encounter: Payer: Self-pay | Admitting: Internal Medicine

## 2017-07-01 ENCOUNTER — Ambulatory Visit (HOSPITAL_COMMUNITY)
Admission: RE | Admit: 2017-07-01 | Discharge: 2017-07-01 | Disposition: A | Discharge status: Home / Self Care | Payer: Medicaid Other | Source: Ambulatory Visit | Attending: Family Medicine | Admitting: Family Medicine

## 2017-07-01 VITALS — BP 148/88 | HR 99 | Temp 98.4°F | Wt 206.0 lb

## 2017-07-01 DIAGNOSIS — I10 Essential (primary) hypertension: Secondary | ICD-10-CM

## 2017-07-01 DIAGNOSIS — R002 Palpitations: Secondary | ICD-10-CM | POA: Diagnosis not present

## 2017-07-01 MED ORDER — METOPROLOL SUCCINATE ER 100 MG PO TB24: 100.0000 mg | ORAL_TABLET | Freq: Every day | ORAL | 0 refills | Status: DC

## 2017-07-01 MED ORDER — AMLODIPINE BESYLATE 5 MG PO TABS: 5.0000 mg | ORAL_TABLET | Freq: Every day | ORAL | 3 refills | Status: DC

## 2017-07-01 NOTE — Progress Notes (Signed)
   [name] [name], MD Phone: [phone]  Reason For Visit: F/U blood pressure   # CHRONIC HTN: Current Meds - HCTZ and Metropolol  Reports good compliance, took meds today. Tolerating well, w/o complaints. Lifestyle - Has not been very active due to depression.  Denies CP, dyspnea, HA, edema, dizziness / lightheadedness  #Palpations  - Patient gets palpations at night specifically.  - Patient has had palpations for 6-8 months, last for about 3-4 hours, and then seems to resolve after taking medication - No new medications.   - No chest pain, SOB, or dyspnea  - Patient denies any significant anxiety associated with these palpations  - patient saw cardiology in the past, possibly in the hospital  - Patient does not smoke or drink EtOH, Denies any other drugs like cocaine   Past Medical History Reviewed problem list.   Medications- reviewed and updated No additions to family history Social history- patient is a non- smoker  Objective: BP (!) 148/88   Pulse 99   Temp 98.4 F (36.9 C) (Axillary)   Wt 206 lb (93.4 kg)   BMI 30.42 kg/m  Gen: NAD, alert, cooperative with exam Cardio: regular rate and rhythm, S1S2 heard, no murmurs appreciated Pulm: clear to auscultation bilaterally, no wheezes, rhonchi or rales Skin: dry, intact, no rashes or lesions  Assessment/Plan: See problem based a/p  Essential hypertension Uncontrolled, currently on metoprolol and HCTZ  - Will added Norvasc  - Due to interaction with simvastatin - will change to Lipitor  - Follow up with PCP in two weeks   Palpitations States often at night time - denies association with anxiety, currently on metoprolol, EKG wnl today - pulse wnl  - Ambulatory referral to Cardiology - CBC pending  - EKG 12-Lead

## 2017-07-01 NOTE — Patient Instructions (Signed)
I want you to follow up with cardiology regarding your palpations. I want you to start Norvasc 5 mg for your blood pressure. I am going to check some labs in the mean time.

## 2017-07-02 ENCOUNTER — Telehealth: Payer: Self-pay | Admitting: Family Medicine

## 2017-07-02 LAB — CBC
Hematocrit: 47.5 % (ref 37.5–51.0)
Hemoglobin: 16.3 g/dL (ref 13.0–17.7)
MCH: 32.3 pg (ref 26.6–33.0)
MCHC: 34.3 g/dL (ref 31.5–35.7)
MCV: 94 fL (ref 79–97)
Platelets: 209 10*3/uL (ref 150–379)
RBC: 5.04 x10E6/uL (ref 4.14–5.80)
RDW: 13.8 % (ref 12.3–15.4)
WBC: 6.9 10*3/uL (ref 3.4–10.8)

## 2017-07-02 NOTE — Telephone Encounter (Addendum)
Received letter from patient's pharmacy inquiring about use of amlodipine along with simvastatin 40mg  daily. Amlodipine cannot be given if simvastatin dose is higher than 20mg . Routing message to Dr. Emmaline Life, who saw patient yesterday and started the amlodipine.  Dr. Emmaline Life - we need to make an adjustment to his medications due to this interaction. Happy to speak in person if it would be helpful.  Thanks Leeanne Rio, MD

## 2017-07-03 ENCOUNTER — Telehealth: Payer: Self-pay | Admitting: Internal Medicine

## 2017-07-03 DIAGNOSIS — E785 Hyperlipidemia, unspecified: Secondary | ICD-10-CM

## 2017-07-03 MED ORDER — ATORVASTATIN CALCIUM 40 MG PO TABS: 40.0000 mg | ORAL_TABLET | Freq: Every day | ORAL | 3 refills | Status: DC

## 2017-07-03 NOTE — Telephone Encounter (Signed)
Called patient to discuss options given he is on simvastatin which interacts with Norvasc. Patient would like to change to Atrovostatin which has less drug interactions, stop simvastatin, and continue Norvasc. Prescription for Lipitor was sent in and simvastatin was stopped

## 2017-07-04 NOTE — Assessment & Plan Note (Addendum)
States often at night time - denies association with anxiety, currently on metoprolol, EKG wnl today - pulse wnl  - Ambulatory referral to Cardiology - CBC pending  - EKG 12-Lead

## 2017-07-04 NOTE — Assessment & Plan Note (Addendum)
Uncontrolled, currently on metoprolol and HCTZ  - Will added Norvasc  - Due to interaction with simvastatin - will change to Lipitor  - Follow up with PCP in two weeks

## 2017-07-18 ENCOUNTER — Ambulatory Visit: Payer: Self-pay | Admitting: Internal Medicine

## 2017-07-26 ENCOUNTER — Encounter: Payer: Self-pay | Admitting: Internal Medicine

## 2017-07-26 ENCOUNTER — Ambulatory Visit (INDEPENDENT_AMBULATORY_CARE_PROVIDER_SITE_OTHER): Payer: Medicaid Other | Admitting: Internal Medicine

## 2017-07-26 VITALS — BP 130/85 | HR 113 | Temp 97.6°F | Ht 69.0 in | Wt 209.2 lb

## 2017-07-26 DIAGNOSIS — I10 Essential (primary) hypertension: Secondary | ICD-10-CM

## 2017-07-26 DIAGNOSIS — R002 Palpitations: Secondary | ICD-10-CM | POA: Diagnosis not present

## 2017-07-26 DIAGNOSIS — H9110 Presbycusis, unspecified ear: Secondary | ICD-10-CM | POA: Diagnosis not present

## 2017-07-26 MED ORDER — METOPROLOL SUCCINATE ER 100 MG PO TB24: 200.0000 mg | ORAL_TABLET | Freq: Every day | ORAL | 3 refills | Status: DC

## 2017-07-26 MED ORDER — HYDROCHLOROTHIAZIDE 25 MG PO TABS: 25.0000 mg | ORAL_TABLET | Freq: Every day | ORAL | 3 refills | Status: DC

## 2017-07-26 MED ORDER — AMLODIPINE BESYLATE 5 MG PO TABS: 5.0000 mg | ORAL_TABLET | Freq: Every day | ORAL | 3 refills | Status: DC

## 2017-07-26 NOTE — Assessment & Plan Note (Signed)
Provided patient with number to cardiology, told him to make an appointment again as he canceled his last appointment

## 2017-07-26 NOTE — Assessment & Plan Note (Addendum)
Due to patient having elevated blood pressures noted at home; will increase metoprolol to 200 mg to help with palpitations and blood pressure, continue Norvasc and hydrochlorothiazide.  Discussed increasing blood pressure medication could cause orthostasis though do believe that his blood pressure could handle it Follow-up with PCP in one month -Patient plans to try to exercising with a friend of his, maybe walking around [place] a couple times a week

## 2017-07-26 NOTE — Assessment & Plan Note (Signed)
Concern for some mild hearing loss. Patient would like to be evaluated by ENT

## 2017-07-26 NOTE — Patient Instructions (Addendum)
I have refilled her medications. I will increase your metoprolol from 100 mg to 200 mg. Please let me know if you have any side effects. I will send in a referral to ENT for your hearing loss. The cardiology number is [phone]

## 2017-07-26 NOTE — Progress Notes (Signed)
   [name] [name] Mo, MD Phone: [phone]  Reason For Visit: Blood Pressure Follow up   # CHRONIC HTN Current Meds - Norvasc, HCTZ and Metropolol - states that his blood pressures at home are significantly higher than today 150s over 190s. Reports good compliance, took meds today. Tolerating well, w/o complaints. Lifestyle - again denies exercising at [facility] in bed most the time due to his depression Denies CP, dyspnea, HA, edema, dizziness / lightheadedness   #Palpitations Endorses palpitations- states he still worried about these, canceled his last cardiology appointment as he was afraid of having something terrible wrong with his heart. Explained to him the importance of keeping this appointment if he wants to further workup his palpitations. Most likely this is due to patient's significant anxiety;  Past Medical History Reviewed problem list.  Medications- reviewed and updated No additions to family history Social history- patient is a non-smoker  Objective: BP 130/85 (BP Location: Right Arm, Patient Position: Sitting, Cuff Size: Normal)   Pulse (!) 113   Temp 97.6 F (36.4 C) (Oral)   Ht 5\' 9"  (1.753 m)   Wt 209 lb 3.2 oz (94.9 kg)   SpO2 93%   BMI 30.89 kg/m  Gen: NAD, alert, cooperative with exam Cardio: regular rate and rhythm, S1S2 heard, no murmurs appreciated Pulm: clear to auscultation bilaterally, no wheezes, rhonchi or rales Extremities: warm, well perfused, No edema, cyanosis or clubbing;  Skin: dry, intact, no rashes or lesions   Assessment/Plan: See problem based a/p  Essential hypertension Due to patient having elevated blood pressures noted at home; will increase metoprolol to 200 mg to help with palpitations and blood pressure, continue Norvasc and hydrochlorothiazide.  Discussed increasing blood pressure medication could cause orthostasis though do believe that his blood pressure could handle it Follow-up with PCP  in one month -Patient plans to try to exercising with a friend of his, maybe walking around [place] a couple times a week  Palpitations Provided patient with number to cardiology, told him to make an appointment again as he canceled his last appointment  Presbyacusia Concern for some mild hearing loss. Patient would like to be evaluated by ENT

## 2017-07-31 ENCOUNTER — Ambulatory Visit (INDEPENDENT_AMBULATORY_CARE_PROVIDER_SITE_OTHER): Payer: Medicaid Other | Admitting: Physician Assistant

## 2017-07-31 ENCOUNTER — Encounter: Payer: Self-pay | Admitting: Physician Assistant

## 2017-07-31 VITALS — BP 108/70 | HR 80 | Ht 69.0 in | Wt 213.0 lb

## 2017-07-31 DIAGNOSIS — R000 Tachycardia, unspecified: Secondary | ICD-10-CM | POA: Diagnosis not present

## 2017-07-31 DIAGNOSIS — R0602 Shortness of breath: Secondary | ICD-10-CM

## 2017-07-31 DIAGNOSIS — F251 Schizoaffective disorder, depressive type: Secondary | ICD-10-CM

## 2017-07-31 DIAGNOSIS — I10 Essential (primary) hypertension: Secondary | ICD-10-CM

## 2017-07-31 NOTE — Progress Notes (Signed)
Cardiology Office Note:    Date:  07/31/2017   ID:  [name], DOB Jun 10, 1956, MRN [id number]  PCP:  Leeanne Rio, MD  Cardiologist:  Dr. Peter Martinique - last seen in 2013   Referring MD: Tonette Bihari, MD   Chief Complaint  Patient presents with  . Palpitations  . Shortness of Breath    History of Present Illness:    [name] is a 61 y.o. male with a hx of major depression, HTN, seizure d/o, HL who is being seen today for the evaluation of palpitations at the request of Tonette Bihari, MD.   He was evaluated by Dr. Martinique in 2013 during an admission for mental status changes and possible syncope as well as chest pain. EF was 35-40%. Myoview demonstrated no perfusion abnormalities. Last seen by Dr. Martinique 6/13. He thought the initial echo likely underestimated the EF due to poor sound transmission.  His last echocardiogram was performed in 8/16 during an admission for TIA symptoms. He was felt to have Dilantin toxicity at that point in time. The echocardiogram demonstrated improved LV function with an EF of 55-60%.  [name] is here alone today. He reported palpitations to his PCP recently. He denies any history of palpitations. However, he has been worried about his heart rate being elevated. It has been recorded in the 90s to low 100s. He also had difficult to control blood pressure. His medications were adjusted several times. He is now on a higher dose of metoprolol with well-controlled blood pressure and heart rate. He does have dyspnea with some activities. He denies PND or edema. He denies significant cough. His shortness of breath seems to be fairly chronic without significant change. He denies chest pain or syncope.  PAD Screen 07/31/2017  Previous PAD dx? No  Previous surgical procedure? No  Pain with walking? No  Feet/toe relief with dangling? No  Painful, non-healing ulcers? No  Extremities discolored? No    Prior CV studies:   The following  studies were reviewed today:  Carotid US 8/16 Bilateral ICA 1-39  Echo 8/16 EF 55-60, normal wall motion, mild MR  Echo 6/16 EF 45-50, diffuse HK, grade 1 diastolic dysfunction, mild LAE  Myoview 2/13 IMPRESSION: There is no definite sign of scar or ischemia.  There is mild diaphragmatic attenuation.  There is normal wall motion.  This is a normal pharmacologic stress nuclear scan  Echo 2/13 Mild LVH, EF 35-40, diffuse HK   Past Medical History:  Diagnosis Date  . Anxiety   . Constipation   . Depression   . Dyslipidemia   . GERD (gastroesophageal reflux disease)   . Hearing loss   . Hypertension   . Memory loss   . MRSA carrier   . Schizoaffective disorder   . Seizures ([place])     Past Surgical History:  Procedure Laterality Date  . CARPAL TUNNEL RELEASE    . COLONOSCOPY  2010  . ORIF SHOULDER FRACTURE    . self orchectomy    . SHOULDER CLOSED REDUCTION Left 09/16/2013   Procedure: CLOSED REDUCTION SHOULDER;  Surgeon: Mauri Pole, MD;  Location: [facility];  Service: Orthopedics;  Laterality: Left;  . SHOULDER HEMI-ARTHROPLASTY Left 09/18/2013   Procedure: LEFT SHOULDER HEMI-ARTHROPLASTY;  Surgeon: Augustin Schooling, MD;  Location: [facility];  Service: Orthopedics;  Laterality: Left;    Current Medications: Current Meds  Medication Sig  . amitriptyline (ELAVIL) 100 MG tablet Take 2 tablets (200 mg total)  by mouth at bedtime.  Marland Kitchen amLODipine (NORVASC) 5 MG tablet Take 1 tablet (5 mg total) by mouth daily.  Marland Kitchen aspirin EC 81 MG tablet Take 1 tablet (81 mg total) by mouth daily.  Marland Kitchen atorvastatin (LIPITOR) 40 MG tablet Take 1 tablet (40 mg total) by mouth daily.  . clonazePAM (KLONOPIN) 1 MG tablet Take 1 tablet (1 mg total) by mouth 3 (three) times daily.  . [facility] SENNA PLUS 8.6-50 MG tablet TAKE 1 TABLET BY MOUTH AT BEDTIME AS NEEDED FOR MILD CONSTIPATION  . hydrochlorothiazide (HYDRODIURIL) 25 MG tablet Take 1 tablet (25 mg total) by mouth daily.  . Lacosamide (VIMPAT) 150 MG  TABS Take 1 tablet (150 mg total) by mouth 2 (two) times daily.  . metoprolol succinate (TOPROL-XL) 100 MG 24 hr tablet Take 2 tablets (200 mg total) by mouth daily.  . Multiple Vitamin (MULTIVITAMIN WITH MINERALS) TABS tablet Take 1 tablet by mouth daily.  . phenytoin (DILANTIN) 100 MG ER capsule TAKE 2 CAPSULES BY MOUTH AT NIGHT  . phenytoin (DILANTIN) 50 MG tablet Chew 1 tablet (50 mg total) by mouth at bedtime.  . polyethylene glycol powder (GLYCOLAX/MIRALAX) powder TAKE 17 GRAMS 2 TIMES A DAY AS NEEDED FOR CONSTIPATION. MIX INTO 8 OF FLUID AND DRINK  . QUEtiapine (SEROQUEL) 300 MG tablet Take 2 tablets (600 mg total) by mouth at bedtime.  Marland Kitchen testosterone cypionate (DEPOTESTOSTERONE CYPIONATE) 200 MG/ML injection Inject 0.75 mLs (150 mg total) into the muscle every 14 (fourteen) days.     Allergies:   Codeine and Sulfa antibiotics   Social History   Social History  . Marital status: Single    Spouse name: N/A  . Number of children: 0  . Years of education: 35   Occupational History  . disabled    Social History Main Topics  . Smoking status: Never Smoker  . Smokeless tobacco: Never Used  . Alcohol use No  . Drug use: No  . Sexual activity: Not Currently    Partners: Female    Birth control/ protection: None   Other Topics Concern  . None   Social History Narrative   Patient does not drink caffeine.   Patient is left handed.     Family Hx: The patient's family history includes Alcohol abuse in his brother and brother; Diabetes in his brother; Stroke in his father and mother.  ROS:   Please see the history of present illness.    ROS All other systems reviewed and are negative.   EKGs/Labs/Other Test Reviewed:    EKG:  EKG is  ordered today.  The ekg ordered today demonstrates NSR, HR 79, left axis deviation, nonspecific ST-T wave changes, QTc 435 ms, no significant change when compared to prior tracing from 2013  Recent Labs: 12/06/2016: ALT 20 02/18/2017: BUN 21;  Creat 0.87; Potassium 4.1; Sodium 136 07/01/2017: Hemoglobin 16.3; Platelets 209   Recent Lipid Panel Lab Results  Component Value Date/Time   CHOL 183 02/18/2017 02:13 PM   TRIG 138 02/18/2017 02:13 PM   HDL 75 02/18/2017 02:13 PM   CHOLHDL 2.4 02/18/2017 02:13 PM   LDLCALC 80 02/18/2017 02:13 PM    Physical Exam:    VS:  BP 108/70 (BP Location: Right Arm, Patient Position: Sitting, Cuff Size: Normal)   Pulse 80   Ht 5\' 9"  (1.753 m)   Wt 213 lb (96.6 kg)   BMI 31.45 kg/m     Wt Readings from Last 3 Encounters:  07/31/17 213 lb (96.6  kg)  07/26/17 209 lb 3.2 oz (94.9 kg)  07/01/17 206 lb (93.4 kg)     Physical Exam  Constitutional: He is oriented to person, place, and time. He appears well-developed and well-nourished. No distress.  HENT:  Head: Normocephalic and atraumatic.  Eyes: No scleral icterus.  Neck: Normal range of motion. No JVD present. Carotid bruit is not present.  Cardiovascular: Normal rate, regular rhythm, S1 normal and S2 normal.   No murmur heard. Pulses:      Dorsalis pedis pulses are 2+ on the right side, and 2+ on the left side.       Posterior tibial pulses are 2+ on the right side, and 2+ on the left side.  Pulmonary/Chest: Effort normal and breath sounds normal. He has no wheezes. He has no rhonchi. He has no rales.  Abdominal: Soft. There is no hepatomegaly.  Musculoskeletal: He exhibits no edema.  Neurological: He is alert and oriented to person, place, and time.  Skin: Skin is warm and dry.  Psychiatric: He has a normal mood and affect.    ASSESSMENT:    1. Tachycardia   2. Shortness of breath   3. Essential hypertension   4. Schizoaffective disorder, depressive type ([place])    PLAN:    In order of problems listed above:  1. Tachycardia -  He had higher heart rates recently. These are now controlled on higher dose metoprolol. Notes indicate he felt palpitations. However, the patient denies this. His ECG does not appear changed from  2013. He had an echocardiogram in 2013 that suggested low EF. I will arrange a follow-up echocardiogram and 24-hour Holter. If these are unremarkable, he can follow-up with cardiology as needed.  2. Shortness of breath -  He has dyspnea with more moderate activities. He does not have any evidence of volume excess on exam. He has never smoked. Question if his dyspnea is related to deconditioning. Obtain echocardiogram as noted.  3. Essential hypertension -  The patient's blood pressure is controlled on his current regimen.  Continue current therapy.    4. Schizoaffective disorder, depressive type [facility]) Follow-up with psychiatry as planned.  Dispo:  Return as needed for a change in symptoms or if testing is abnormal. .   Medication Adjustments/Labs and Tests Ordered: Current medicines are reviewed at length with the patient today.  Concerns regarding medicines are outlined above.  Orders/Tests:  Orders Placed This Encounter  Procedures  . Holter monitor - 24 hour  . EKG 12-Lead  . ECHOCARDIOGRAM COMPLETE   Medication changes: No orders of the defined types were placed in this encounter.  Signed, [name], PA-C  07/31/2017 4:35 PM    [facility] [place], [place], [place]  [address] Phone: [phone]; Fax: [phone]

## 2017-07-31 NOTE — Patient Instructions (Signed)
Medication Instructions:  Your physician recommends that you continue on your current medications as directed. Please refer to the Current Medication list given to you today.   Labwork: NONE ORDERED TODAY  Testing/Procedures: 1. Your physician has requested that you have an echocardiogram. Echocardiography is a painless test that uses sound waves to create images of your heart. It provides your doctor with information about the size and shape of your heart and how well your heart's chambers and valves are working. This procedure takes approximately one hour. There are no restrictions for this procedure.  2. Your physician has recommended that you wear a 24 HOUR holter monitor. Holter monitors are medical devices that record the heart's electrical activity. Doctors most often use these monitors to diagnose arrhythmias. Arrhythmias are problems with the speed or rhythm of the heartbeat. The monitor is a small, portable device. You can wear one while you do your normal daily activities. This is usually used to diagnose what is causing palpitations/syncope (passing out).    Follow-Up: DR. Martinique AS NEEDED PENDING TEST RESULTS  Any Other Special Instructions Will Be Listed Below (If Applicable).     If you need a refill on your cardiac medications before your next appointment, please call your pharmacy.

## 2017-08-07 ENCOUNTER — Emergency Department (HOSPITAL_COMMUNITY)
Admission: EM | Admit: 2017-08-07 | Discharge: 2017-08-07 | Discharge status: Against Medical Advice | Payer: Medicaid Other

## 2017-08-08 ENCOUNTER — Telehealth: Payer: Self-pay

## 2017-08-08 NOTE — Telephone Encounter (Signed)
Patient called and said that his blood pressure was 176/106.  He stated that he only picked up two of his medications from the pharmacy and that he is on three. I talked to [facility] and told her that he was slurring his speech and what his pressure was.  She told me to call him back and tell him that he needed to go to the ED. Called the patient and told him and he stated that he was not going to the ED and that he was going to take his medications and see if that bought his pressure down.  Called the patient back and informed him that all of [facility] [address]. I told him that he would need to go to the ED if his pressure did not go down and he started experiencing any type of symptoms such as blurred vision, headaches, dizziness or slurred speech.  Patient states that he lives alone and is a bit confused.Ozella Almond

## 2017-08-14 ENCOUNTER — Encounter (HOSPITAL_COMMUNITY): Payer: Self-pay | Admitting: Clinical

## 2017-08-14 ENCOUNTER — Ambulatory Visit (INDEPENDENT_AMBULATORY_CARE_PROVIDER_SITE_OTHER): Payer: Medicaid Other | Admitting: Clinical

## 2017-08-14 DIAGNOSIS — F259 Schizoaffective disorder, unspecified: Secondary | ICD-10-CM | POA: Diagnosis not present

## 2017-08-14 NOTE — Progress Notes (Signed)
   THERAPIST PROGRESS NOTE  Session Time: 2:30pm-3:30pm  Participation Level: Active  Behavioral Response: DisheveledConfusedDepressed and Hopeless  Type of Therapy: Individual Therapy  Treatment Goals addressed: improve psychiatric symptoms, Elevate mood, improve faulty thinking, interpersonal relationship skills  Interventions: CBT and Motivational Interviewing  Summary: [name] is a 61 y.o male who presents with Schizoaffective disorder, depressive type. .   Suicidal/Homicidal: No -without intent/plan  Therapist Response: Elmon met clinician for an individual session. Ramadan discussed his psychiatric symptoms, his current life events and his homework. Clinician built rapport and discussed recent incident of self-harm. Assessed for SI and intent for self-harm. Discussed appropriate coping skills and different choices for expression of feelings. Clinician identified unhelpful thinking patterns and processed paranoia about brother. Clinician reviewed and updated treatment plan.   Plan: Return again in 1-2 weeks.  Diagnosis: Axis I: Schizoaffective disorder, depressive type.  Carlus Pavlov, LCSW 08/14/17

## 2017-08-15 ENCOUNTER — Ambulatory Visit (INDEPENDENT_AMBULATORY_CARE_PROVIDER_SITE_OTHER): Payer: Medicaid Other

## 2017-08-15 ENCOUNTER — Ambulatory Visit (HOSPITAL_COMMUNITY): Payer: Medicaid Other | Attending: Cardiovascular Disease

## 2017-08-15 ENCOUNTER — Other Ambulatory Visit: Payer: Self-pay

## 2017-08-15 DIAGNOSIS — I5030 Unspecified diastolic (congestive) heart failure: Secondary | ICD-10-CM | POA: Diagnosis not present

## 2017-08-15 DIAGNOSIS — R000 Tachycardia, unspecified: Secondary | ICD-10-CM | POA: Diagnosis not present

## 2017-08-15 DIAGNOSIS — I051 Rheumatic mitral insufficiency: Secondary | ICD-10-CM | POA: Insufficient documentation

## 2017-08-15 DIAGNOSIS — R0602 Shortness of breath: Secondary | ICD-10-CM

## 2017-08-15 DIAGNOSIS — I10 Essential (primary) hypertension: Secondary | ICD-10-CM | POA: Diagnosis not present

## 2017-08-16 ENCOUNTER — Telehealth: Payer: Self-pay | Admitting: *Deleted

## 2017-08-16 ENCOUNTER — Encounter: Payer: Self-pay | Admitting: Physician Assistant

## 2017-08-16 NOTE — Telephone Encounter (Signed)
Pt notified of echo results by phone with verbal understanding to results given today. Pt aware to continue Tx plan. Will fax copy to PCP. Pt ask for a copy of results to be mailed to him. Pt's address verified.

## 2017-08-28 ENCOUNTER — Other Ambulatory Visit: Payer: Self-pay | Admitting: Neurology

## 2017-08-28 ENCOUNTER — Ambulatory Visit (HOSPITAL_COMMUNITY): Payer: Self-pay | Admitting: Clinical

## 2017-09-13 ENCOUNTER — Other Ambulatory Visit: Payer: Self-pay

## 2017-09-13 DIAGNOSIS — R0609 Other forms of dyspnea: Principal | ICD-10-CM

## 2017-09-13 DIAGNOSIS — R0600 Dyspnea, unspecified: Secondary | ICD-10-CM

## 2017-09-16 ENCOUNTER — Telehealth: Payer: Self-pay

## 2017-09-16 NOTE — Telephone Encounter (Signed)
Called patient left message on personal voice mail.I spoke to Dr.Jordan last week and told him you are still sob.He advised stress myoview and follow up appointment with him.Advised to call me back to schedule.

## 2017-09-17 ENCOUNTER — Telehealth: Payer: Self-pay | Admitting: Cardiology

## 2017-09-17 NOTE — Telephone Encounter (Signed)
Last phone call:Called patient left message on personal voice mail.I spoke to Dr.Jordan last week and told him you are still sob.He advised stress myoview and follow up appointment with him.Advised to call me back to schedule.  Tried to call pt back and vm is full unable to leave message. Will try again later

## 2017-09-17 NOTE — Telephone Encounter (Signed)
New message    Pt is calling returning call from yesterday. Please call.

## 2017-09-17 NOTE — Telephone Encounter (Signed)
[name] will call pt to schedule testing and follow up appt

## 2017-09-19 ENCOUNTER — Telehealth (HOSPITAL_COMMUNITY): Payer: Self-pay

## 2017-09-19 NOTE — Telephone Encounter (Signed)
Encounter complete. 

## 2017-09-22 ENCOUNTER — Other Ambulatory Visit (HOSPITAL_COMMUNITY): Payer: Self-pay | Admitting: Psychiatry

## 2017-09-22 DIAGNOSIS — F251 Schizoaffective disorder, depressive type: Secondary | ICD-10-CM

## 2017-09-24 ENCOUNTER — Telehealth: Payer: Self-pay | Admitting: Cardiology

## 2017-09-24 NOTE — Telephone Encounter (Signed)
New message    Pt c/o BP issue: STAT if pt c/o blurred vision, one-sided weakness or slurred speech  1. What are your last 5 BP readings? 195/112  2. Are you having any other symptoms (ex. Dizziness, headache, blurred vision, passed out)? No   3. What is your BP issue? Pt BP is high.

## 2017-09-24 NOTE — Telephone Encounter (Signed)
No answer

## 2017-09-25 ENCOUNTER — Ambulatory Visit (HOSPITAL_COMMUNITY)
Admission: RE | Admit: 2017-09-25 | Discharge: 2017-09-25 | Disposition: A | Discharge status: Home / Self Care | Payer: Medicaid Other | Source: Ambulatory Visit | Attending: Internal Medicine | Admitting: Internal Medicine

## 2017-09-25 DIAGNOSIS — R569 Unspecified convulsions: Secondary | ICD-10-CM | POA: Insufficient documentation

## 2017-09-25 DIAGNOSIS — F329 Major depressive disorder, single episode, unspecified: Secondary | ICD-10-CM | POA: Insufficient documentation

## 2017-09-25 DIAGNOSIS — R0600 Dyspnea, unspecified: Secondary | ICD-10-CM

## 2017-09-25 DIAGNOSIS — R002 Palpitations: Secondary | ICD-10-CM | POA: Insufficient documentation

## 2017-09-25 DIAGNOSIS — Z8673 Personal history of transient ischemic attack (TIA), and cerebral infarction without residual deficits: Secondary | ICD-10-CM | POA: Diagnosis not present

## 2017-09-25 DIAGNOSIS — R0609 Other forms of dyspnea: Secondary | ICD-10-CM

## 2017-09-25 DIAGNOSIS — F259 Schizoaffective disorder, unspecified: Secondary | ICD-10-CM | POA: Insufficient documentation

## 2017-09-25 DIAGNOSIS — I10 Essential (primary) hypertension: Secondary | ICD-10-CM | POA: Insufficient documentation

## 2017-09-25 MED ORDER — REGADENOSON 0.4 MG/5ML IV SOLN
0.4000 mg | Freq: Once | INTRAVENOUS | Status: AC
  Administered 2017-09-25: 0.4 mg via INTRAVENOUS

## 2017-09-25 MED ORDER — TECHNETIUM TC 99M TETROFOSMIN IV KIT
9.8000 | PACK | Freq: Once | INTRAVENOUS | Status: AC | PRN
  Administered 2017-09-25: 9.8 via INTRAVENOUS
  Filled 2017-09-25: qty 10

## 2017-09-25 MED ORDER — TECHNETIUM TC 99M TETROFOSMIN IV KIT
31.8000 | PACK | Freq: Once | INTRAVENOUS | Status: AC | PRN
  Administered 2017-09-25: 31.8 via INTRAVENOUS
  Filled 2017-09-25: qty 32

## 2017-09-26 ENCOUNTER — Ambulatory Visit: Payer: Medicaid Other | Admitting: Neurology

## 2017-09-26 LAB — MYOCARDIAL PERFUSION IMAGING
LV dias vol: 101 mL (ref 62–150)
LV sys vol: 46 mL
Peak HR: 94 {beats}/min
Rest HR: 76 {beats}/min
SDS: 1
SRS: 2
SSS: 3
TID: 1.25

## 2017-09-27 ENCOUNTER — Other Ambulatory Visit: Payer: Self-pay

## 2017-09-27 DIAGNOSIS — I10 Essential (primary) hypertension: Secondary | ICD-10-CM

## 2017-09-27 MED ORDER — LOSARTAN POTASSIUM 50 MG PO TABS: 50.0000 mg | ORAL_TABLET | Freq: Every day | ORAL | 6 refills | Status: DC

## 2017-09-27 NOTE — Telephone Encounter (Signed)
Patient returned call of Dr. Martinique. He reports his BP has been up to 170s/140s - he took extra metoprolol succinate. He was able to get his diastolic down to 52C. He took metoprolol succinate 500mg  in one day period to lower his BP and had a stress test and they had a hard time getting his HR up during his stress test (he is aware that results were normal). He reports his BP has been elevated for 2-3 months. He has no other complaints  Advised patient that he should not take more medication than prescribed and I would need to seek recommendations from Dr. Martinique

## 2017-09-27 NOTE — Telephone Encounter (Signed)
We need to add losartan 50 mg daily to current meds for BP control. Check a BMET in 3 weeks. Monitor BP and let us know if not controlled.  Deborrah Mabin Martinique MD, [facility]

## 2017-09-27 NOTE — Telephone Encounter (Signed)
LMTCB regarding BP (stress test results copied below) _________________________________________________  Notes recorded by Martinique, Peter M, MD on 09/27/2017 at 7:34 AM EDT Myoview looks very good. No ischemia. Normal EF. I suspect some of his SOB is related to deconditioning. Recommend regular walking.   Peter Martinique MD, [facility]

## 2017-09-27 NOTE — Telephone Encounter (Signed)
Returned call to patient Dr.Jordan's recommendations given.Advised to have bmet in 3 weeks.Monitor B/P and keep appointment with Dr.Jordan 11/01/17 at 3:20 pm.Advised to call sooner if needed.

## 2017-09-29 ENCOUNTER — Ambulatory Visit (HOSPITAL_COMMUNITY)
Admission: EM | Admit: 2017-09-29 | Discharge: 2017-09-29 | Disposition: A | Discharge status: Home / Self Care | Payer: Medicaid Other | Attending: Internal Medicine | Admitting: Internal Medicine

## 2017-09-29 ENCOUNTER — Encounter (HOSPITAL_COMMUNITY): Payer: Self-pay | Admitting: Emergency Medicine

## 2017-09-29 DIAGNOSIS — I10 Essential (primary) hypertension: Secondary | ICD-10-CM | POA: Diagnosis not present

## 2017-09-29 DIAGNOSIS — F419 Anxiety disorder, unspecified: Secondary | ICD-10-CM | POA: Insufficient documentation

## 2017-09-29 DIAGNOSIS — R002 Palpitations: Secondary | ICD-10-CM | POA: Insufficient documentation

## 2017-09-29 DIAGNOSIS — Z823 Family history of stroke: Secondary | ICD-10-CM | POA: Insufficient documentation

## 2017-09-29 DIAGNOSIS — F429 Obsessive-compulsive disorder, unspecified: Secondary | ICD-10-CM | POA: Insufficient documentation

## 2017-09-29 DIAGNOSIS — F251 Schizoaffective disorder, depressive type: Secondary | ICD-10-CM | POA: Diagnosis not present

## 2017-09-29 DIAGNOSIS — K219 Gastro-esophageal reflux disease without esophagitis: Secondary | ICD-10-CM | POA: Diagnosis not present

## 2017-09-29 DIAGNOSIS — Z8673 Personal history of transient ischemic attack (TIA), and cerebral infarction without residual deficits: Secondary | ICD-10-CM | POA: Insufficient documentation

## 2017-09-29 DIAGNOSIS — Z833 Family history of diabetes mellitus: Secondary | ICD-10-CM | POA: Diagnosis not present

## 2017-09-29 DIAGNOSIS — Z79899 Other long term (current) drug therapy: Secondary | ICD-10-CM | POA: Diagnosis not present

## 2017-09-29 DIAGNOSIS — Z811 Family history of alcohol abuse and dependence: Secondary | ICD-10-CM | POA: Diagnosis not present

## 2017-09-29 DIAGNOSIS — E785 Hyperlipidemia, unspecified: Secondary | ICD-10-CM | POA: Insufficient documentation

## 2017-09-29 DIAGNOSIS — S3021XA Contusion of penis, initial encounter: Secondary | ICD-10-CM | POA: Insufficient documentation

## 2017-09-29 DIAGNOSIS — G40909 Epilepsy, unspecified, not intractable, without status epilepticus: Secondary | ICD-10-CM | POA: Diagnosis not present

## 2017-09-29 DIAGNOSIS — Z885 Allergy status to narcotic agent status: Secondary | ICD-10-CM | POA: Insufficient documentation

## 2017-09-29 DIAGNOSIS — R233 Spontaneous ecchymoses: Secondary | ICD-10-CM

## 2017-09-29 DIAGNOSIS — J211 Acute bronchiolitis due to human metapneumovirus: Secondary | ICD-10-CM | POA: Insufficient documentation

## 2017-09-29 DIAGNOSIS — R5381 Other malaise: Secondary | ICD-10-CM | POA: Insufficient documentation

## 2017-09-29 DIAGNOSIS — Z9889 Other specified postprocedural states: Secondary | ICD-10-CM | POA: Insufficient documentation

## 2017-09-29 DIAGNOSIS — R238 Other skin changes: Secondary | ICD-10-CM

## 2017-09-29 DIAGNOSIS — Z882 Allergy status to sulfonamides status: Secondary | ICD-10-CM | POA: Insufficient documentation

## 2017-09-29 DIAGNOSIS — Z7982 Long term (current) use of aspirin: Secondary | ICD-10-CM | POA: Diagnosis not present

## 2017-09-29 DIAGNOSIS — X58XXXA Exposure to other specified factors, initial encounter: Secondary | ICD-10-CM | POA: Insufficient documentation

## 2017-09-29 LAB — COMPREHENSIVE METABOLIC PANEL
ALT: 28 U/L (ref 17–63)
AST: 24 U/L (ref 15–41)
Albumin: 4 g/dL (ref 3.5–5.0)
Alkaline Phosphatase: 91 U/L (ref 38–126)
Anion gap: 6 (ref 5–15)
BUN: 13 mg/dL (ref 6–20)
CO2: 27 mmol/L (ref 22–32)
Calcium: 8.8 mg/dL — ABNORMAL LOW (ref 8.9–10.3)
Chloride: 101 mmol/L (ref 101–111)
Creatinine, Ser: 1.04 mg/dL (ref 0.61–1.24)
GFR calc Af Amer: >60 mL/min (ref >60)
GFR calc non Af Amer: >60 mL/min (ref >60)
Glucose, Bld: 100 mg/dL — ABNORMAL HIGH (ref 65–99)
Potassium: 3.9 mmol/L (ref 3.5–5.1)
Sodium: 134 mmol/L — ABNORMAL LOW (ref 135–145)
Total Bilirubin: 0.5 mg/dL (ref 0.3–1.2)
Total Protein: 6.7 g/dL (ref 6.5–8.1)

## 2017-09-29 LAB — CBC WITH DIFFERENTIAL/PLATELET
Basophils Absolute: 0 10*3/uL (ref 0.0–0.1)
Basophils Relative: 1 %
Eosinophils Absolute: 0.1 10*3/uL (ref 0.0–0.7)
Eosinophils Relative: 1 %
HCT: 43.2 % (ref 39.0–52.0)
Hemoglobin: 15.1 g/dL (ref 13.0–17.0)
Lymphocytes Relative: 20 %
Lymphs Abs: 1.3 10*3/uL (ref 0.7–4.0)
MCH: 32.8 pg (ref 26.0–34.0)
MCHC: 35 g/dL (ref 30.0–36.0)
MCV: 93.7 fL (ref 78.0–100.0)
Monocytes Absolute: 0.4 10*3/uL (ref 0.1–1.0)
Monocytes Relative: 7 %
Neutro Abs: 4.7 10*3/uL (ref 1.7–7.7)
Neutrophils Relative %: 71 %
Platelets: 167 10*3/uL (ref 150–400)
RBC: 4.61 MIL/uL (ref 4.22–5.81)
RDW: 14.6 % (ref 11.5–15.5)
WBC: 6.6 10*3/uL (ref 4.0–10.5)

## 2017-09-29 LAB — PROTIME-INR
INR: 0.97
Prothrombin Time: 12.7 seconds (ref 11.4–15.2)

## 2017-09-29 LAB — APTT: aPTT: 23 seconds — ABNORMAL LOW (ref 24–36)

## 2017-09-29 NOTE — ED Provider Notes (Signed)
[facility]    CSN: [id number] Arrival date & time: 09/29/17  1348     History   Chief Complaint Chief Complaint  Patient presents with  . Bleeding/Bruising    HPI [name] is a 61 y.o. male. patient presents today with unusual bruising. He had labs drawn from the right elbow 3 or 4 days ago, difficult stick, and has a large resolving bruise in the right forearm.He also routinely injects a prostaglandin into his penis, and last evening had a large bruise to the penis after doing this. It is not particularly swollen or painful, but he does have a large bruise which is unusual for him. He has no prior history of unusual bleeding or bruising. He is taking a baby aspirin daily, but no other blood thinners. He is not having gum bleeding when he brushes his teeth. No nosebleeds. No blood with urine or stool passage. No other bruising noted.   HPI  Past Medical History:  Diagnosis Date  . Anxiety   . Constipation   . Depression   . Dyslipidemia   . GERD (gastroesophageal reflux disease)   . Hearing loss   . History of echocardiogram    Echo 8/18: EF 50-55, normal wall motion, grade 1 diastolic dysfunction, mild MR  . Hypertension   . Memory loss   . MRSA carrier   . Schizoaffective disorder   . Seizures [facility])     Patient Active Problem List   Diagnosis Date Noted  . Presbyacusia 07/26/2017  . Palpitations 07/01/2017  . Acute bronchiolitis due to human metapneumovirus   . Mild sleep apnea 06/15/2016  . Actinic keratosis 06/15/2016  . Nocturnal dyspnea 02/03/2016  . Skin lesion of face 02/03/2016  . Generalized weakness 01/17/2016  . Paresthesia of both hands 09/16/2015  . Phenytoin toxicity   . TIA (transient ischemic attack) 07/29/2015  . Vertigo 07/29/2015  . Schizoaffective disorder, depressive type with good prognostic features ([place]) 07/13/2015  . Constipation   . Verruca 05/18/2015  . Seborrheic dermatitis of scalp 05/18/2015  . MDD (major  depressive disorder), recurrent severe, without psychosis ([place])   . OCD (obsessive compulsive disorder)   . Sepsis ([place]) 01/02/2015  . Confusion 12/31/2014  . Seizures ([place])   . Schizoaffective disorder, depressive type ([place])   . Orthostatic hypotension   . Falls   . Seizure ([place]) 11/18/2014  . Schizoaffective disorder, unspecified type ([place])   . Essential hypertension   . Overdose of beta-adrenergic antagonist drug 10/22/2014  . Overdose   . Nocturnal enuresis 03/23/2014  . Benzodiazepine dependence, continuous ([place]) 01/27/2014  . Severe major depression ([place]) 01/21/2014  . Frozen shoulder syndrome 09/20/2013  . Somnolence 09/20/2013  . Schizoaffective disorder ([place]) 09/20/2013  . Malaise and fatigue 04/21/2013  . Presbyopia 04/25/2012  . Androgen deficiency 09/07/2011  . Constipation, chronic 09/07/2011  . Other specified disorders of liver 12/30/2008  . Overweight(278.02) 09/08/2008  . Hyperlipidemia 01/28/2008  . Seizure disorder ([place]) 01/28/2008  . OSTEOPENIA 07/01/2007    Past Surgical History:  Procedure Laterality Date  . CARPAL TUNNEL RELEASE    . COLONOSCOPY  2010  . ORIF SHOULDER FRACTURE    . self orchectomy    . SHOULDER CLOSED REDUCTION Left 09/16/2013   Procedure: CLOSED REDUCTION SHOULDER;  Surgeon: Mauri Pole, MD;  Location: [facility];  Service: Orthopedics;  Laterality: Left;  . SHOULDER HEMI-ARTHROPLASTY Left 09/18/2013   Procedure: LEFT SHOULDER HEMI-ARTHROPLASTY;  Surgeon: Augustin Schooling, MD;  Location: [facility];  Service: Orthopedics;  Laterality: Left;       Home Medications    Prior to Admission medications   Medication Sig Start Date End Date Taking? Authorizing Provider  amitriptyline (ELAVIL) 100 MG tablet Take 2 tablets (200 mg total) by mouth at bedtime. 06/27/17   Arfeen, Arlyce Harman, MD  amLODipine (NORVASC) 5 MG tablet Take 1 tablet (5 mg total) by mouth daily. 07/26/17   Tonette Bihari, MD  aspirin EC 81 MG tablet Take 1 tablet (81 mg  total) by mouth daily. 07/21/15   Withrow, Elyse Jarvis, FNP  atorvastatin (LIPITOR) 40 MG tablet Take 1 tablet (40 mg total) by mouth daily. 07/03/17   Mikell, Jeani Sow, MD  clonazePAM (KLONOPIN) 1 MG tablet Take 1 tablet (1 mg total) by mouth 3 (three) times daily. 06/27/17   Arfeen, Arlyce Harman, MD  [facility] SENNA PLUS 8.6-50 MG tablet TAKE 1 TABLET BY MOUTH AT BEDTIME AS NEEDED FOR MILD CONSTIPATION 08/29/16   Leeanne Rio, MD  hydrochlorothiazide (HYDRODIURIL) 25 MG tablet Take 1 tablet (25 mg total) by mouth daily. 07/26/17   Mikell, Jeani Sow, MD  Lacosamide (VIMPAT) 150 MG TABS Take 1 tablet (150 mg total) by mouth 2 (two) times daily. 05/06/17   Kathrynn Ducking, MD  losartan (COZAAR) 50 MG tablet Take 1 tablet (50 mg total) by mouth daily. 09/27/17 12/26/17  Martinique, Peter M, MD  metoprolol succinate (TOPROL-XL) 100 MG 24 hr tablet Take 2 tablets (200 mg total) by mouth daily. 07/26/17   Mikell, Jeani Sow, MD  Multiple Vitamin (MULTIVITAMIN WITH MINERALS) TABS tablet Take 1 tablet by mouth daily.    [provider]  phenytoin (DILANTIN) 100 MG ER capsule TAKE 2 CAPSULES BY MOUTH AT NIGHT 03/01/17   Kathrynn Ducking, MD  phenytoin (DILANTIN) 50 MG tablet Chew 1 tablet (50 mg total) by mouth at bedtime. 03/01/17   Kathrynn Ducking, MD  polyethylene glycol powder (GLYCOLAX/MIRALAX) powder TAKE 17 GRAMS 2 TIMES A DAY AS NEEDED FOR CONSTIPATION. MIX INTO 8 OF FLUID AND DRINK 02/27/17   Leeanne Rio, MD  QUEtiapine (SEROQUEL) 300 MG tablet Take 2 tablets (600 mg total) by mouth at bedtime. 06/27/17   Arfeen, Arlyce Harman, MD  testosterone cypionate (DEPOTESTOSTERONE CYPIONATE) 200 MG/ML injection Inject 0.75 mLs (150 mg total) into the muscle every 14 (fourteen) days. 09/12/16   Leeanne Rio, MD    Family History Family History  Problem Relation Age of Onset  . Stroke Father        Living at 1  . Stroke Mother        Stroke in late 44's. Died in her 42's  . Alcohol abuse Brother    . Alcohol abuse Brother   . Diabetes Brother     Social History Social History  Substance Use Topics  . Smoking status: Never Smoker  . Smokeless tobacco: Never Used  . Alcohol use No     Allergies   Codeine; Sulfa antibiotics; and Sulfasalazine   Review of Systems Review of Systems  All other systems reviewed and are negative.    Physical Exam Triage Vital Signs ED Triage Vitals [09/29/17 1407]  Enc Vitals Group     BP (!) 124/53     Pulse Rate 74     Resp 18     Temp 98.8 F (37.1 C)     Temp Source Oral     SpO2 100 %     Weight  Height      Pain Score      Pain Loc    Updated Vital Signs BP (!) 124/53 (BP Location: Right Arm)   Pulse 74   Temp 98.8 F (37.1 C) (Oral)   Resp 18   SpO2 100%   Physical Exam  Constitutional: He is oriented to person, place, and time. No distress.  Alert, nicely groomed  HENT:  Head: Atraumatic.  Eyes:  Conjugate gaze, no eye redness/drainage  Neck: Neck supple.  Cardiovascular: Normal rate and regular rhythm.   Pulmonary/Chest: No respiratory distress. He has no wheezes. He has no rales.  Lungs clear, symmetric breath sounds  Abdominal: He exhibits no distension.  Musculoskeletal: Normal range of motion.  Symmetric trace pitting edema to the bilateral lower extremities  Neurological: He is alert and oriented to person, place, and time.  Skin: Skin is warm and dry.  No cyanosis  Nursing note and vitals reviewed.    UC Treatments / Results  Labs Results for orders placed or performed during the hospital encounter of 09/29/17  CBC with Differential  Result Value Ref Range   WBC 6.6 4.0 - 10.5 K/uL   RBC 4.61 4.22 - 5.81 MIL/uL   Hemoglobin 15.1 13.0 - 17.0 g/dL   HCT 43.2 39.0 - 52.0 %   MCV 93.7 78.0 - 100.0 fL   MCH 32.8 26.0 - 34.0 pg   MCHC 35.0 30.0 - 36.0 g/dL   RDW 14.6 11.5 - 15.5 %   Platelets 167 150 - 400 K/uL   Neutrophils Relative % 71 %   Neutro Abs 4.7 1.7 - 7.7 K/uL   Lymphocytes  Relative 20 %   Lymphs Abs 1.3 0.7 - 4.0 K/uL   Monocytes Relative 7 %   Monocytes Absolute 0.4 0.1 - 1.0 K/uL   Eosinophils Relative 1 %   Eosinophils Absolute 0.1 0.0 - 0.7 K/uL   Basophils Relative 1 %   Basophils Absolute 0.0 0.0 - 0.1 K/uL  Comprehensive metabolic panel  Result Value Ref Range   Sodium 134 (L) 135 - 145 mmol/L   Potassium 3.9 3.5 - 5.1 mmol/L   Chloride 101 101 - 111 mmol/L   CO2 27 22 - 32 mmol/L   Glucose, Bld 100 (H) 65 - 99 mg/dL   BUN 13 6 - 20 mg/dL   Creatinine, Ser 1.04 0.61 - 1.24 mg/dL   Calcium 8.8 (L) 8.9 - 10.3 mg/dL   Total Protein 6.7 6.5 - 8.1 g/dL   Albumin 4.0 3.5 - 5.0 g/dL   AST 24 15 - 41 U/L   ALT 28 17 - 63 U/L   Alkaline Phosphatase 91 38 - 126 U/L   Total Bilirubin 0.5 0.3 - 1.2 mg/dL   GFR calc non Af Amer >60 >60 mL/min   GFR calc Af Amer >60 >60 mL/min   Anion gap 6 5 - 15  Protime-INR  Result Value Ref Range   Prothrombin Time 12.7 11.4 - 15.2 seconds   INR 0.97   APTT  Result Value Ref Range   aPTT 23 (L) 24 - 36 seconds    Procedures Procedures (including critical care time) None today   Final Clinical Impressions(s) / UC Diagnoses   Final diagnoses:  Abnormal bruising   No danger signs seen on exam today.  Bruising could be just a coincidence of 2 blood vessel accidents close in time together.  Blood tests to check for abnormalities of blood clotting were drawn today at the  urgent care.  The urgent care will contact you if there are abnormalities that need rapid attention.  Normal or nonsignificant results can be found in MyChart (directions for signing up for this app are in this handout).  Go to the ER if you develop severe headache, active bleeding from other sites.     Controlled Substance Prescriptions [place] Controlled Substance Registry consulted? No   Sherlene Shams, MD 10/01/17 [phone]

## 2017-09-29 NOTE — ED Triage Notes (Signed)
Pt sts bruising to right arm after having blood drawn and in his penis where he injected himself over last couple of days

## 2017-09-29 NOTE — Discharge Instructions (Addendum)
No danger signs seen on exam today.  Bruising could be just a coincidence of 2 blood vessel accidents close in time together.  Blood tests to check for abnormalities of blood clotting were drawn today at the urgent care.  The urgent care will contact you if there are abnormalities that need rapid attention.  Normal or nonsignificant results can be found in MyChart (directions for signing up for this app are in this handout).  Go to the ER if you develop severe headache, active bleeding from other sites.

## 2017-09-30 ENCOUNTER — Ambulatory Visit (HOSPITAL_COMMUNITY): Payer: Self-pay | Admitting: Psychiatry

## 2017-10-01 ENCOUNTER — Ambulatory Visit (HOSPITAL_COMMUNITY): Payer: Self-pay | Admitting: Psychiatry

## 2017-10-03 ENCOUNTER — Telehealth (HOSPITAL_COMMUNITY): Payer: Self-pay

## 2017-10-03 DIAGNOSIS — F251 Schizoaffective disorder, depressive type: Secondary | ICD-10-CM

## 2017-10-03 NOTE — Telephone Encounter (Signed)
Patient calling for refills, he missed his last appointment and has rescheduled for next month. Please review and advise, thank you

## 2017-10-04 MED ORDER — AMITRIPTYLINE HCL 100 MG PO TABS: 200.0000 mg | ORAL_TABLET | Freq: Every day | ORAL | 0 refills | Status: DC

## 2017-10-04 MED ORDER — QUETIAPINE FUMARATE 300 MG PO TABS: 600.0000 mg | ORAL_TABLET | Freq: Every day | ORAL | 0 refills | Status: DC

## 2017-10-04 MED ORDER — CLONAZEPAM 1 MG PO TABS: 1.0000 mg | ORAL_TABLET | Freq: Three times a day (TID) | ORAL | 0 refills | Status: DC

## 2017-10-04 NOTE — Telephone Encounter (Signed)
Please provide 30 day supply until his next appointment.

## 2017-10-04 NOTE — Telephone Encounter (Signed)
Okay, I sent all three medications in for one month, I called patient to let him know.

## 2017-10-18 ENCOUNTER — Encounter: Payer: Self-pay | Admitting: Cardiology

## 2017-10-18 LAB — BASIC METABOLIC PANEL
BUN/Creatinine Ratio: 20 (ref 10–24)
BUN: 19 mg/dL (ref 8–27)
CO2: 24 mmol/L (ref 20–29)
Calcium: 9.8 mg/dL (ref 8.6–10.2)
Chloride: 95 mmol/L — ABNORMAL LOW (ref 96–106)
Creatinine, Ser: 0.95 mg/dL (ref 0.76–1.27)
GFR calc Af Amer: 98 mL/min/{1.73_m2} (ref >59)
GFR calc non Af Amer: 85 mL/min/{1.73_m2} (ref >59)
Glucose: 110 mg/dL — ABNORMAL HIGH (ref 65–99)
Potassium: 4 mmol/L (ref 3.5–5.2)
Sodium: 137 mmol/L (ref 134–144)

## 2017-10-22 ENCOUNTER — Telehealth: Payer: Self-pay | Admitting: Neurology

## 2017-10-22 NOTE — Telephone Encounter (Signed)
I called the wife, left a message.  I will try to get the patient worked in soon.  The patient does have an appointment this Friday at 11:30 AM.

## 2017-10-22 NOTE — Telephone Encounter (Signed)
Noted, will keep appt I made for pt this Friday at 12pm, check in 1130am

## 2017-10-22 NOTE — Telephone Encounter (Signed)
Pt friend(on [facility]) has called asking if there is any way for pt to see Dr [name] before 11-27.  I offered 11-16@ 9 and she declined.  She states there has been a change in personality and would very much like for pt to be seen asap.  Please call.  Pt is on wait list
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
## 2017-10-24 NOTE — Telephone Encounter (Signed)
LVM for [name] to call and let me know if they can come at 11am, check in 1030am tomorrow instead. Asked her to call me back before 5pm to let me know.

## 2017-10-25 ENCOUNTER — Ambulatory Visit: Payer: Medicaid Other | Admitting: Neurology

## 2017-10-25 ENCOUNTER — Encounter: Payer: Self-pay | Admitting: Neurology

## 2017-10-25 ENCOUNTER — Ambulatory Visit: Payer: Self-pay | Admitting: Neurology

## 2017-10-25 VITALS — BP 111/65 | HR 92 | Ht 69.0 in | Wt 214.5 lb

## 2017-10-25 DIAGNOSIS — R413 Other amnesia: Secondary | ICD-10-CM | POA: Diagnosis not present

## 2017-10-25 DIAGNOSIS — Z5181 Encounter for therapeutic drug level monitoring: Secondary | ICD-10-CM | POA: Diagnosis not present

## 2017-10-25 DIAGNOSIS — E538 Deficiency of other specified B group vitamins: Secondary | ICD-10-CM

## 2017-10-25 HISTORY — DX: Other amnesia: R41.3

## 2017-10-25 NOTE — Telephone Encounter (Signed)
Spoke with [name] and confirmed that they can come for earlier appt. this morning--will check in at 1030 for an 11am appt/fim

## 2017-10-25 NOTE — Patient Instructions (Signed)
    We will check MRI of the brain. 
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
## 2017-10-26 LAB — RPR: RPR Ser Ql: NONREACTIVE

## 2017-10-26 LAB — VITAMIN B12: Vitamin B-12: 422 pg/mL (ref 232–1245)

## 2017-10-26 LAB — AMMONIA: Ammonia: 42 ug/dL (ref 27–102)

## 2017-10-26 LAB — SEDIMENTATION RATE: Sed Rate: 11 mm/hr (ref 0–30)

## 2017-10-28 ENCOUNTER — Other Ambulatory Visit (HOSPITAL_COMMUNITY): Payer: Self-pay | Admitting: Psychiatry

## 2017-10-28 ENCOUNTER — Telehealth: Payer: Self-pay | Admitting: *Deleted

## 2017-10-28 DIAGNOSIS — F251 Schizoaffective disorder, depressive type: Secondary | ICD-10-CM

## 2017-10-28 NOTE — Telephone Encounter (Signed)
Called and spoke with pt about unremarkable labs per CW,MD note. He verbalized understanding.

## 2017-10-28 NOTE — Telephone Encounter (Signed)
-----   Message from Kathrynn Ducking, MD sent at 10/26/2017  3:54 PM EST -----  The blood work results are unremarkable.  The Dilantin level was ordered but never got sent.  Please call the patient. ----- Message ----- From: Lavone Neri Lab Results In Sent: 10/26/2017   7:40 AM To: Kathrynn Ducking, MD

## 2017-10-29 ENCOUNTER — Ambulatory Visit (HOSPITAL_COMMUNITY): Payer: Medicaid Other | Admitting: Psychiatry

## 2017-10-29 ENCOUNTER — Encounter (HOSPITAL_COMMUNITY): Payer: Self-pay | Admitting: Psychiatry

## 2017-10-29 ENCOUNTER — Other Ambulatory Visit: Payer: Self-pay | Admitting: Family Medicine

## 2017-10-29 DIAGNOSIS — Z736 Limitation of activities due to disability: Secondary | ICD-10-CM | POA: Diagnosis not present

## 2017-10-29 DIAGNOSIS — I10 Essential (primary) hypertension: Secondary | ICD-10-CM

## 2017-10-29 DIAGNOSIS — Z811 Family history of alcohol abuse and dependence: Secondary | ICD-10-CM | POA: Diagnosis not present

## 2017-10-29 DIAGNOSIS — F251 Schizoaffective disorder, depressive type: Secondary | ICD-10-CM

## 2017-10-29 MED ORDER — AMITRIPTYLINE HCL 100 MG PO TABS: 200.0000 mg | ORAL_TABLET | Freq: Every day | ORAL | 1 refills | Status: DC

## 2017-10-29 MED ORDER — QUETIAPINE FUMARATE 300 MG PO TABS: 600.0000 mg | ORAL_TABLET | Freq: Every day | ORAL | 1 refills | Status: DC

## 2017-10-29 MED ORDER — CLONAZEPAM 1 MG PO TABS: 1.0000 mg | ORAL_TABLET | Freq: Three times a day (TID) | ORAL | 1 refills | Status: DC

## 2017-10-29 MED ORDER — METOPROLOL SUCCINATE ER 100 MG PO TB24: 200.0000 mg | ORAL_TABLET | Freq: Every day | ORAL | 3 refills | Status: DC

## 2017-10-29 NOTE — Progress Notes (Signed)
[facility] MD/PA/NP OP Progress Note  10/29/2017 3:23 PM [name]  MRN:  [id number]  Chief Complaint: I am nervous that my brother will come.  Some nights I do not sleep but I am taking my medication.  They are working.    HPI: [name] came for his follow-up appointment.  He is compliant with medication and reported no side effects.  He continues to have chronic paranoia about his brother and he believes he may come 1 day.  He recently seen neurologist because of memory problem and he was advised not to take more than usual medication.  He is taking seizure medicine and he is happy that there has been no recent seizures.  Some time he takes extra Klonopin which makes him dizzy.  He was advised not to take more medication because extra medicine can cause worsening of the memory problem, dizziness and fall.  He lives by himself but sometimes does not leave his house because he is scared with the strangers.  However he denies any suicidal thoughts, irritability, anger, mania or any aggressive behavior.  He is not drinking or using any illegal substances.  His appetite is okay.  His energy level is fair.  He is very reluctant to cut down his medication.  He is taking amitriptyline, Klonopin and Seroquel.  He has no tremors shakes or any EPS.  His attention and concentration is fair.  Patient saw once [name] for counseling and he will schedule more appointment for counseling.  Visit Diagnosis:    ICD-10-CM   1. Schizoaffective disorder, depressive type with good prognostic features (HCC) F25.1 QUEtiapine (SEROQUEL) 300 MG tablet    clonazePAM (KLONOPIN) 1 MG tablet    amitriptyline (ELAVIL) 100 MG tablet    Past Psychiatric History: Viewed. Patient has depression since his college.  He has at least 15 hospitalization due to severe depression, paranoia and suicidal thoughts.  His last hospitalization was in August 2016.  At that time he was thinking to kill himself by inhaling helium.  He was discharged on  Seroquel 900 mg and amitriptyline for milligram at bedtime. He had history of suicidal attempt when he cut his testicle because he does not want to be depressed.  He was seeing [name] at [facility] and then moved to [name].  Patient has multiple trial of SSRIs however he had reaction to SSRIs.  In the past he had tried nortriptyline, Haldol, Geodon, Seroquel, lithium, Prozac, Wellbutrin, Paxil, Ativan, Lexapro and Zoloft.  Patient denies any history of mania or any drug use.  Past Medical History:  Past Medical History:  Diagnosis Date  . Anxiety   . Constipation   . Depression   . Dyslipidemia   . GERD (gastroesophageal reflux disease)   . Hearing loss   . History of echocardiogram    Echo 8/18: EF 50-55, normal wall motion, grade 1 diastolic dysfunction, mild MR  . Hypertension   . Memory difficulty 10/25/2017  . Memory loss   . MRSA carrier   . Schizoaffective disorder   . Seizures ([place])     Past Surgical History:  Procedure Laterality Date  . CARPAL TUNNEL RELEASE    . COLONOSCOPY  2010  . ORIF SHOULDER FRACTURE    . self orchectomy      Family Psychiatric History: Reviewed.  Family History:  Family History  Problem Relation Age of Onset  . Stroke Father        Living at 21  . Stroke  Mother        Stroke in late 78's. Died in her 76's  . Alcohol abuse Brother   . Alcohol abuse Brother   . Diabetes Brother     Social History:  Social History   Socioeconomic History  . Marital status: Single    Spouse name: Not on file  . Number of children: 0  . Years of education: 57  . Highest education level: Not on file  Social Needs  . Financial resource strain: Not on file  . Food insecurity - worry: Not on file  . Food insecurity - inability: Not on file  . Transportation needs - medical: Not on file  . Transportation needs - non-medical: Not on file  Occupational History  . Occupation: disabled  Tobacco Use  . Smoking status: Never  Smoker  . Smokeless tobacco: Never Used  Substance and Sexual Activity  . Alcohol use: No    Alcohol/week: 0.0 oz  . Drug use: No  . Sexual activity: Not Currently    Partners: Female    Birth control/protection: None  Other Topics Concern  . Not on file  Social History Narrative   Patient does not drink caffeine.   Patient is left handed.    Allergies:  Allergies  Allergen Reactions  . Codeine Nausea And Vomiting  . Sulfa Antibiotics Nausea And Vomiting  . Sulfasalazine Nausea And Vomiting    Metabolic Disorder Labs: Lab Results  Component Value Date   HGBA1C 5.6 06/11/2016   MPG 111 07/30/2015   MPG 123 07/13/2015   Lab Results  Component Value Date   PROLACTIN 12.0 07/13/2015   Lab Results  Component Value Date   CHOL 183 02/18/2017   TRIG 138 02/18/2017   HDL 75 02/18/2017   CHOLHDL 2.4 02/18/2017   VLDL 28 02/18/2017   LDLCALC 80 02/18/2017   LDLCALC 62 07/30/2015   Lab Results  Component Value Date   TSH 2.644 07/13/2015   TSH 3.882 02/16/2015    Therapeutic Level Labs: No results found for: LITHIUM No results found for: VALPROATE No components found for:  CBMZ  Current Medications: Current Outpatient Medications  Medication Sig Dispense Refill  . amitriptyline (ELAVIL) 100 MG tablet Take 2 tablets (200 mg total) by mouth at bedtime. 60 tablet 0  . amLODipine (NORVASC) 5 MG tablet Take 1 tablet (5 mg total) by mouth daily. 90 tablet 3  . aspirin EC 81 MG tablet Take 1 tablet (81 mg total) by mouth daily.    Marland Kitchen atorvastatin (LIPITOR) 40 MG tablet Take 1 tablet (40 mg total) by mouth daily. 30 tablet 3  . clonazePAM (KLONOPIN) 1 MG tablet Take 1 tablet (1 mg total) by mouth 3 (three) times daily. 90 tablet 0  . [employer] SENNA PLUS 8.6-50 MG tablet TAKE 1 TABLET BY MOUTH AT BEDTIME AS NEEDED FOR MILD CONSTIPATION 30 tablet 3  . hydrochlorothiazide (HYDRODIURIL) 25 MG tablet Take 1 tablet (25 mg total) by mouth daily. 90 tablet 3  . Lacosamide (VIMPAT)  150 MG TABS Take 1 tablet (150 mg total) by mouth 2 (two) times daily. 60 tablet 5  . losartan (COZAAR) 50 MG tablet Take 1 tablet (50 mg total) by mouth daily. 30 tablet 6  . metoprolol succinate (TOPROL-XL) 100 MG 24 hr tablet Take 2 tablets (200 mg total) daily by mouth. 60 tablet 3  . Multiple Vitamin (MULTIVITAMIN WITH MINERALS) TABS tablet Take 1 tablet by mouth daily.    . phenytoin (DILANTIN) 100  MG ER capsule TAKE 2 CAPSULES BY MOUTH AT NIGHT 180 capsule 3  . phenytoin (DILANTIN) 50 MG tablet Chew 1 tablet (50 mg total) by mouth at bedtime. 90 tablet 3  . polyethylene glycol powder (GLYCOLAX/MIRALAX) powder TAKE 17 GRAMS 2 TIMES A DAY AS NEEDED FOR CONSTIPATION. MIX INTO 8 OF FLUID AND DRINK 850 g 4  . QUEtiapine (SEROQUEL) 300 MG tablet Take 2 tablets (600 mg total) by mouth at bedtime. 60 tablet 0  . testosterone cypionate (DEPOTESTOSTERONE CYPIONATE) 200 MG/ML injection Inject 0.75 mLs (150 mg total) into the muscle every 14 (fourteen) days.     No current facility-administered medications for this visit.      Musculoskeletal: Strength & Muscle Tone: within normal limits Gait & Station: normal Patient leans: N/A  Psychiatric Specialty Exam: ROS  Blood pressure 136/78, pulse 84, height 5\' 11"  (1.803 m), weight 214 lb 12.8 oz (97.4 kg).There is no height or weight on file to calculate BMI.  General Appearance: Fairly Groomed  Eye Contact:  Fair  Speech:  Slow  Volume:  Normal  Mood:  Anxious  Affect:  Constricted  Thought Process:  Descriptions of Associations: Circumstantial  Orientation:  Full (Time, Place, and Person)  Thought Content: Paranoid Ideation, Rumination and Chronic paranoia about brother.   Suicidal Thoughts:  No  Homicidal Thoughts:  No  Memory:  Immediate;   Fair Recent;   Fair Remote;   Fair  Judgement:  Good  Insight:  Good  Psychomotor Activity:  Decreased  Concentration:  Concentration: Fair and Attention Span: Fair  Recall:  [employer] of  Knowledge: Good  Language: Good  Akathisia:  No  Handed:  Right  AIMS (if indicated): not done  Assets:  Communication Skills Desire for Improvement Housing  ADL's:  Intact  Cognition: Impaired,  Mild  Sleep:  Fair   Screenings: AIMS     Admission (Discharged) from 07/13/2015 in [facility]  AIMS Total Score  0    AUDIT     Admission (Discharged) from 07/13/2015 in [facility] Admission (Discharged) from 05/08/2014 in [facility] Admission (Discharged) from 01/21/2014 in [facility] [place] [facility]  Alcohol Use Disorder Identification Test Final Score (AUDIT)  0  0  0    Mini-Mental     Office Visit from 10/25/2017 in [facility]  Total Score (max 30 points )  25    PHQ2-9     Office Visit from 07/26/2017 in [facility] Office Visit from 05/02/2017 in [facility] Office Visit from 01/22/2017 in [facility] Office Visit from 06/11/2016 in [facility] Office Visit from 01/30/2016 in [facility]  PHQ-2 Total Score  6  0  6  0  0  PHQ-9 Total Score  No data  No data  8  No data  No data       Assessment and Plan: Schizoaffective disorder bipolar type.  Reassurance given.  Reinforced not to take more medication due to side effects.  Discussed polypharmacy.  Patient is now very careful and he had a pill dispenser and taking the medication as prescribed.  He does not want to reduce his medication.  Continue Seroquel 600 mg at bedtime, Klonopin 1 mg 3 times a day and amitriptyline 200 mg at bedtime.  Discussed medication side effects in detail especially anticholinergic side  effects that can cause further memory impairment.  Encouraged to see [name] for counseling.  Patient will schedule appointment to see a therapist.  Recommended to call us back if he  has any question, concern if he feels worsening of the symptoms.  Follow-up in 2 months.   Mykell Genao T., MD 10/29/2017, 3:23 PM

## 2017-10-29 NOTE — Progress Notes (Deleted)
Cardiology Office Note:    Date:  10/29/2017   ID:  [name], DOB 1956/03/07, MRN [id number]  PCP:  Leeanne Rio, MD  Cardiologist:  Dr. Gurtha Picker Martinique - last seen in 2013   Referring MD: Leeanne Rio, MD   No chief complaint on file.   History of Present Illness:    [name] is a 61 y.o. male with a hx of major depression, HTN, seizure d/o, HL who was seen in August by [name] PA-C for evaluation of palpitations and dyspnea.   He was evaluated  in 2013 during an admission for mental status changes and possible syncope as well as chest pain. EF was 35-40%. Myoview demonstrated no perfusion abnormalities. Last seen by Dr. Martinique 6/13. He thought the initial echo likely underestimated the EF due to poor sound transmission.  His last echocardiogram was performed in 8/16 during an admission for TIA symptoms. He was felt to have Dilantin toxicity at that point in time. The echocardiogram demonstrated improved LV function with an EF of 55-60%.  Since August 2018 he had extensive cardiac evaluation including Echo, Holter monitor, and Myoview study which were unremarkable. He was started on losartan for elevated BP.     PAD Screen 07/31/2017  Previous PAD dx? No  Previous surgical procedure? No  Pain with walking? No  Feet/toe relief with dangling? No  Painful, non-healing ulcers? No  Extremities discolored? No    Prior CV studies:   The following studies were reviewed today:  Carotid US 8/16 Bilateral ICA 1-39  Echo 8/16 EF 55-60, normal wall motion, mild MR  Echo 6/16 EF 45-50, diffuse HK, grade 1 diastolic dysfunction, mild LAE  Myoview 2/13 IMPRESSION: There is no definite sign of scar or ischemia.  There is mild diaphragmatic attenuation.  There is normal wall motion.  This is a normal pharmacologic stress nuclear scan  Echo 2/13 Mild LVH, EF 35-40, diffuse HK  Echo 08/15/17: Study Conclusions  - Left ventricle: The cavity size was  normal. Wall thickness was   normal. Systolic function was normal. The estimated ejection   fraction was in the range of 50% to 55%. Wall motion was normal;   there were no regional wall motion abnormalities. Doppler   parameters are consistent with abnormal left ventricular   relaxation (grade 1 diastolic dysfunction). - Mitral valve: There was mild regurgitation.  Impressions:  - Normal LV systolic function; probable mild diastolic dysfunction;   mild MR.  Holter monitor 08/15/17: Study Highlights    Normal sinus rhythm  4 beat run of PACs, otherwise normal Holter monitor.   Myoview 09/25/17: Study Highlights     The left ventricular ejection fraction is mildly decreased (45-54%).  Nuclear stress EF: 54%.  No T wave inversion was noted during stress.  There was no ST segment deviation noted during stress.  This is a low risk study.   No significant reversible ischemia. LVEF 54% with normal wall motion. This is a low risk study.      Past Medical History:  Diagnosis Date  . Anxiety   . Constipation   . Depression   . Dyslipidemia   . GERD (gastroesophageal reflux disease)   . Hearing loss   . History of echocardiogram    Echo 8/18: EF 50-55, normal wall motion, grade 1 diastolic dysfunction, mild MR  . Hypertension   . Memory difficulty 10/25/2017  . Memory loss   . MRSA carrier   . Schizoaffective disorder   .  Seizures ([place])     Past Surgical History:  Procedure Laterality Date  . CARPAL TUNNEL RELEASE    . COLONOSCOPY  2010  . ORIF SHOULDER FRACTURE    . self orchectomy      Current Medications: No outpatient medications have been marked as taking for the 11/01/17 encounter (Appointment) with Martinique, Afia Messenger M, MD.     Allergies:   Codeine; Sulfa antibiotics; and Sulfasalazine   Social History   Socioeconomic History  . Marital status: Single    Spouse name: Not on file  . Number of children: 0  . Years of education: 61  . Highest  education level: Not on file  Social Needs  . Financial resource strain: Not on file  . Food insecurity - worry: Not on file  . Food insecurity - inability: Not on file  . Transportation needs - medical: Not on file  . Transportation needs - non-medical: Not on file  Occupational History  . Occupation: disabled  Tobacco Use  . Smoking status: Never Smoker  . Smokeless tobacco: Never Used  Substance and Sexual Activity  . Alcohol use: No    Alcohol/week: 0.0 oz  . Drug use: No  . Sexual activity: Not Currently    Partners: Female    Birth control/protection: None  Other Topics Concern  . Not on file  Social History Narrative   Patient does not drink caffeine.   Patient is left handed.     Family Hx: The patient's family history includes Alcohol abuse in his brother and brother; Diabetes in his brother; Stroke in his father and mother.  ROS:   Please see the history of present illness.    ROS All other systems reviewed and are negative.   EKGs/Labs/Other Test Reviewed:    EKG:  EKG is  ordered today.  The ekg ordered today demonstrates NSR, HR 79, left axis deviation, nonspecific ST-T wave changes, QTc 435 ms, no significant change when compared to prior tracing from 2013  Recent Labs: 09/29/2017: ALT 28; Hemoglobin 15.1; Platelets 167 10/18/2017: BUN 19; Creatinine, Ser 0.95; Potassium 4.0; Sodium 137   Recent Lipid Panel Lab Results  Component Value Date/Time   CHOL 183 02/18/2017 02:13 PM   TRIG 138 02/18/2017 02:13 PM   HDL 75 02/18/2017 02:13 PM   CHOLHDL 2.4 02/18/2017 02:13 PM   LDLCALC 80 02/18/2017 02:13 PM    Physical Exam:    VS:  There were no vitals taken for this visit.    Wt Readings from Last 3 Encounters:  10/25/17 214 lb 8 oz (97.3 kg)  09/25/17 213 lb (96.6 kg)  07/31/17 213 lb (96.6 kg)     Physical Exam  Constitutional: He is oriented to person, place, and time. He appears well-developed and well-nourished. No distress.  HENT:  Head:  Normocephalic and atraumatic.  Eyes: No scleral icterus.  Neck: Normal range of motion. No JVD present. Carotid bruit is not present.  Cardiovascular: Normal rate, regular rhythm, S1 normal and S2 normal.  No murmur heard. Pulses:      Dorsalis pedis pulses are 2+ on the right side, and 2+ on the left side.       Posterior tibial pulses are 2+ on the right side, and 2+ on the left side.  Pulmonary/Chest: Effort normal and breath sounds normal. He has no wheezes. He has no rhonchi. He has no rales.  Abdominal: Soft. There is no hepatomegaly.  Musculoskeletal: He exhibits no edema.  Neurological: He is alert  and oriented to person, place, and time.  Skin: Skin is warm and dry.  Psychiatric: He has a normal mood and affect.    ASSESSMENT:    No diagnosis found. PLAN:    In order of problems listed above:  1. Tachycardia -  He had higher heart rates recently. These are now controlled on higher dose metoprolol. Notes indicate he felt palpitations. However, the patient denies this. His ECG does not appear changed from 2013. He had an echocardiogram in 2013 that suggested low EF. I will arrange a follow-up echocardiogram and 24-hour Holter. If these are unremarkable, he can follow-up with cardiology as needed.  2. Shortness of breath -  He has dyspnea with more moderate activities. He does not have any evidence of volume excess on exam. He has never smoked. Question if his dyspnea is related to deconditioning. Obtain echocardiogram as noted.  3. Essential hypertension -  The patient's blood pressure is controlled on his current regimen.  Continue current therapy.    4. Schizoaffective disorder, depressive type [facility]) Follow-up with psychiatry as planned.  Dispo:  No Follow-up on file.   Medication Adjustments/Labs and Tests Ordered: Current medicines are reviewed at length with the patient today.  Concerns regarding medicines are outlined above.  Orders/Tests:  No orders of the  defined types were placed in this encounter.  Medication changes: No orders of the defined types were placed in this encounter.  Signed, Cynthya Yam Martin[place], MD  10/29/2017 9:17 AM    [place]

## 2017-11-01 ENCOUNTER — Encounter: Payer: Self-pay | Admitting: *Deleted

## 2017-11-01 ENCOUNTER — Ambulatory Visit: Payer: Medicaid Other | Admitting: Cardiology

## 2017-11-05 ENCOUNTER — Other Ambulatory Visit: Payer: Self-pay | Admitting: Family Medicine

## 2017-11-05 DIAGNOSIS — E785 Hyperlipidemia, unspecified: Secondary | ICD-10-CM

## 2017-11-05 MED ORDER — ATORVASTATIN CALCIUM 40 MG PO TABS: 40.0000 mg | ORAL_TABLET | Freq: Every day | ORAL | 3 refills | Status: DC

## 2017-11-12 ENCOUNTER — Ambulatory Visit: Payer: Medicaid Other | Admitting: Neurology

## 2017-11-14 ENCOUNTER — Other Ambulatory Visit: Payer: Self-pay

## 2017-11-19 ENCOUNTER — Telehealth: Payer: Self-pay | Admitting: Cardiology

## 2017-11-19 NOTE — Telephone Encounter (Signed)
New message      *STAT* If patient is at the pharmacy, call can be transferred to refill team.   1. Which medications need to be refilled? (please list name of each medication and dose if known)  Losartan   2. Which pharmacy/location (including street and city if local pharmacy) is medication to be sent to? [facility] on [address]   3. Do they need a 30 day or 90 day supply? [place]

## 2017-11-30 ENCOUNTER — Ambulatory Visit
Admission: RE | Admit: 2017-11-30 | Discharge: 2017-11-30 | Disposition: A | Discharge status: Home / Self Care | Payer: Medicaid Other | Source: Ambulatory Visit | Attending: Neurology | Admitting: Neurology

## 2017-11-30 DIAGNOSIS — R413 Other amnesia: Secondary | ICD-10-CM

## 2017-12-01 ENCOUNTER — Telehealth: Payer: Self-pay | Admitting: Neurology

## 2017-12-01 NOTE — Telephone Encounter (Signed)
I called the patient.  MRI of the brain shows minimal white matter changes.  No change from 2016.  The episodes of altered functioning level likely represent medication overdose events.   MRI brain 12/01/17:  IMPRESSION: Unremarkable MRI scan of the brain showing only mild changes of chronic microvascular ischemia and generalized cerebral atrophy.  Overall no significant change compared with previous MRI dated 07/29/2015.

## 2017-12-12 ENCOUNTER — Ambulatory Visit: Payer: Medicaid Other | Admitting: Physician Assistant

## 2017-12-13 ENCOUNTER — Encounter: Payer: Self-pay | Admitting: Cardiology

## 2017-12-13 ENCOUNTER — Ambulatory Visit (HOSPITAL_COMMUNITY)
Admission: EM | Admit: 2017-12-13 | Discharge: 2017-12-13 | Disposition: A | Discharge status: Home / Self Care | Payer: Medicaid Other | Attending: Internal Medicine | Admitting: Internal Medicine

## 2017-12-13 ENCOUNTER — Ambulatory Visit (INDEPENDENT_AMBULATORY_CARE_PROVIDER_SITE_OTHER): Payer: Medicaid Other | Admitting: Cardiology

## 2017-12-13 ENCOUNTER — Encounter (HOSPITAL_COMMUNITY): Payer: Self-pay | Admitting: Emergency Medicine

## 2017-12-13 DIAGNOSIS — W19XXXA Unspecified fall, initial encounter: Secondary | ICD-10-CM

## 2017-12-13 DIAGNOSIS — F259 Schizoaffective disorder, unspecified: Secondary | ICD-10-CM

## 2017-12-13 DIAGNOSIS — R55 Syncope and collapse: Secondary | ICD-10-CM | POA: Insufficient documentation

## 2017-12-13 DIAGNOSIS — Z23 Encounter for immunization: Secondary | ICD-10-CM | POA: Diagnosis not present

## 2017-12-13 DIAGNOSIS — G40909 Epilepsy, unspecified, not intractable, without status epilepticus: Secondary | ICD-10-CM | POA: Diagnosis not present

## 2017-12-13 DIAGNOSIS — W01198A Fall on same level from slipping, tripping and stumbling with subsequent striking against other object, initial encounter: Secondary | ICD-10-CM

## 2017-12-13 DIAGNOSIS — S0101XA Laceration without foreign body of scalp, initial encounter: Secondary | ICD-10-CM | POA: Diagnosis not present

## 2017-12-13 DIAGNOSIS — W19XXXD Unspecified fall, subsequent encounter: Secondary | ICD-10-CM

## 2017-12-13 DIAGNOSIS — I10 Essential (primary) hypertension: Secondary | ICD-10-CM

## 2017-12-13 MED ORDER — TETANUS-DIPHTH-ACELL PERTUSSIS 5-2.5-18.5 LF-MCG/0.5 IM SUSP
0.5000 mL | Freq: Once | INTRAMUSCULAR | Status: AC
  Administered 2017-12-13: 0.5 mL via INTRAMUSCULAR

## 2017-12-13 MED ORDER — TETANUS-DIPHTH-ACELL PERTUSSIS 5-2.5-18.5 LF-MCG/0.5 IM SUSP
INTRAMUSCULAR | Status: AC
  Filled 2017-12-13: qty 0.5

## 2017-12-13 NOTE — Discharge Instructions (Signed)
6 staples put in today. Your tetanus was updated today. Remove current dressing in 24 hours. See attached document for wound care. Please keep area clean and dry. Do not soak in water. You can clean area gently with soap/water. Monitor for and spreading redness, increased warmth. Take tylenol for pain. No alarming signs on your neurological exam. If you develop headache/blurry vision, nausea/vomiting, confusion/altered mental status, dizziness, weakness, passing out, imbalance, go to the emergency department for further evaluation. Otherwise, follow up here or with PCP for staple removal in 10 days.

## 2017-12-13 NOTE — Assessment & Plan Note (Signed)
Followed by Dr Arfeen-psychiatry. The pt reports compliance with his medications as prescribed

## 2017-12-13 NOTE — Progress Notes (Signed)
12/13/2017 [name]   05-10-1956  [id number]  Primary Physician Leeanne Rio, MD Primary Cardiologist: Dr Martinique  HPI:  61 y/o male, lives alone, with a history of schizoaffective disorder, seizure disorder ("none in years"), and a history of falls felt to be secondary to inappropriate medication dosing. He was seen by cardiology in August 2018 for palpitations. A prior echo and Myoview in 2013 showed an EF of 35-40% with no ischemia. An echo was done in Aug 2018 for suspected TIA, this showed normal LVF. A Myoview was done Oct 2018 that was low risk. He had an appointment yesterday for follow up but did not show.   He walked into the office this afternoon asking if he could be seen. His face was covered in dried blood. He tells me he fell at home yesterday getting out of bed and hit his head. He told me he was on the floor 4 hours but didn't know it. He tells me this was not a seizure. In the office he is calm, answering questions appropriately. He says he is taking his medications as prescribed. He tells me he didn't wash off the blood because he was afraid of it hurting and he wanted [place] to see it.    Current Outpatient Medications  Medication Sig Dispense Refill  . amitriptyline (ELAVIL) 100 MG tablet Take 2 tablets (200 mg total) at bedtime by mouth. 60 tablet 1  . amLODipine (NORVASC) 5 MG tablet Take 1 tablet (5 mg total) by mouth daily. 90 tablet 3  . aspirin EC 81 MG tablet Take 1 tablet (81 mg total) by mouth daily.    Marland Kitchen atorvastatin (LIPITOR) 40 MG tablet Take 1 tablet (40 mg total) by mouth daily. 30 tablet 3  . clonazePAM (KLONOPIN) 1 MG tablet Take 1 tablet (1 mg total) 3 (three) times daily by mouth. 90 tablet 1  . [facility] SENNA PLUS 8.6-50 MG tablet TAKE 1 TABLET BY MOUTH AT BEDTIME AS NEEDED FOR MILD CONSTIPATION 30 tablet 3  . hydrochlorothiazide (HYDRODIURIL) 25 MG tablet Take 1 tablet (25 mg total) by mouth daily. 90 tablet 3  . Lacosamide (VIMPAT) 150 MG TABS  Take 1 tablet (150 mg total) by mouth 2 (two) times daily. 60 tablet 5  . losartan (COZAAR) 50 MG tablet Take 1 tablet (50 mg total) by mouth daily. 30 tablet 6  . metoprolol succinate (TOPROL-XL) 100 MG 24 hr tablet Take 2 tablets (200 mg total) daily by mouth. 60 tablet 3  . Multiple Vitamin (MULTIVITAMIN WITH MINERALS) TABS tablet Take 1 tablet by mouth daily.    . phenytoin (DILANTIN) 100 MG ER capsule TAKE 2 CAPSULES BY MOUTH AT NIGHT 180 capsule 3  . phenytoin (DILANTIN) 50 MG tablet Chew 1 tablet (50 mg total) by mouth at bedtime. 90 tablet 3  . polyethylene glycol powder (GLYCOLAX/MIRALAX) powder TAKE 17 GRAMS 2 TIMES A DAY AS NEEDED FOR CONSTIPATION. MIX INTO 8 OF FLUID AND DRINK 850 g 4  . QUEtiapine (SEROQUEL) 300 MG tablet Take 2 tablets (600 mg total) at bedtime by mouth. 60 tablet 1  . testosterone cypionate (DEPOTESTOSTERONE CYPIONATE) 200 MG/ML injection Inject 0.75 mLs (150 mg total) into the muscle every 14 (fourteen) days.     No current facility-administered medications for this visit.     Allergies  Allergen Reactions  . Codeine Nausea And Vomiting  . Sulfa Antibiotics Nausea And Vomiting  . Sulfasalazine Nausea And Vomiting    Past Medical History:  Diagnosis Date  . Anxiety   . Constipation   . Depression   . Dyslipidemia   . GERD (gastroesophageal reflux disease)   . Hearing loss   . History of echocardiogram    Echo 8/18: EF 50-55, normal wall motion, grade 1 diastolic dysfunction, mild MR  . Hypertension   . Memory difficulty 10/25/2017  . Memory loss   . MRSA carrier   . Schizoaffective disorder   . Seizures ([place])     Social History   Socioeconomic History  . Marital status: Single    Spouse name: Not on file  . Number of children: 0  . Years of education: 75  . Highest education level: Not on file  Social Needs  . Financial resource strain: Not on file  . Food insecurity - worry: Not on file  . Food insecurity - inability: Not on file  .  Transportation needs - medical: Not on file  . Transportation needs - non-medical: Not on file  Occupational History  . Occupation: disabled  Tobacco Use  . Smoking status: Never Smoker  . Smokeless tobacco: Never Used  Substance and Sexual Activity  . Alcohol use: No    Alcohol/week: 0.0 oz  . Drug use: No  . Sexual activity: Not Currently    Partners: Female    Birth control/protection: None  Other Topics Concern  . Not on file  Social History Narrative   Patient does not drink caffeine.   Patient is left handed.     Family History  Problem Relation Age of Onset  . Stroke Father        Living at 71  . Stroke Mother        Stroke in late 30's. Died in her 49's  . Alcohol abuse Brother   . Alcohol abuse Brother   . Diabetes Brother      Review of Systems: General: negative for chills, fever, night sweats or weight changes.  Cardiovascular: negative for chest pain, dyspnea on exertion, edema, orthopnea, palpitations, paroxysmal nocturnal dyspnea or shortness of breath Dermatological: negative for rash Respiratory: negative for cough or wheezing Urologic: negative for hematuria Abdominal: negative for nausea, vomiting, diarrhea, bright red blood per rectum, melena, or hematemesis Neurologic: negative for visual changes, syncope, or dizziness All other systems reviewed and are otherwise negative except as noted above.    Blood pressure 112/71, pulse 70.  General appearance: alert, cooperative and no distress Neck: no carotid bruit and no JVD Lungs: clear to auscultation bilaterally Heart: regular rate and rhythm Skin: Skin color, texture, turgor normal. No rashes or lesions Neurologic: Grossly normal, PEERLA, strength equal   EKG NSR, inferior TWI-old  ASSESSMENT AND PLAN:   Fall Pt seen in the office today. He walked in with dried blood all over his face from a fall at home yesterday.   Seizure disorder ([place]) Followed by Dr [name]. The episode yesterday did  not sound like a seizure. He was aware of everything. He was not incontinent.   Schizoaffective disorder ([place]) Followed by Dr Arfeen-psychiatry. The pt reports compliance with his medications as prescribed  Essential hypertension On medication. B/P controlled today.    PLAN  After he was cleaned up he has a golf ball sized knot Lt side of his head with a 2" open laceration. I strongly urged the pt go to the ED for further evaluation. He doesn't want to go to the ED but agreed to go to Urgent Care. He tells me he feels fine,  no dizziness, he is oriented and appropriate.   Kerin Ransom PA-C 12/13/2017 1:36 PM

## 2017-12-13 NOTE — ED Provider Notes (Signed)
[place]    CSN: [id number] Arrival date & time: 12/13/17  1511     History   Chief Complaint Chief Complaint  Patient presents with  . Head Laceration    HPI ORIAN FIGUEIRA is a 61 y.o. male.   61 year old male with history of anxiety, depression, HLD, GERD, schizoaffective disorder, seizures, HTN comes in for head laceration sustained last night around 11 PM.  States he was moving off of his bed, realized that he stepped on his computer, try to switch feet and fell from being in stable.  States he hit his head at the corner of the table.  He did not realized he was bleeding, went to bed, woke up with blood covering his face and the pillow.  He had an appointment with his cardiologist and went for follow-up, he was evaluated there and  was told to follow-up here for evaluation.  He denies loss of consciousness, confusion, nausea, vomiting, headache, blurry vision.  He was able to drive on his own without problems, and will came into the exam room unassisted.  He denies recent seizures.  Denies weakness, dizziness, facial drooping.      Past Medical History:  Diagnosis Date  . Anxiety   . Constipation   . Depression   . Dyslipidemia   . GERD (gastroesophageal reflux disease)   . Hearing loss   . History of echocardiogram    Echo 8/18: EF 50-55, normal wall motion, grade 1 diastolic dysfunction, mild MR  . Hypertension   . Memory difficulty 10/25/2017  . Memory loss   . MRSA carrier   . Schizoaffective disorder   . Seizures [facility])     Patient Active Problem List   Diagnosis Date Noted  . Syncope and collapse 12/13/2017  . Memory difficulty 10/25/2017  . Presbyacusia 07/26/2017  . Palpitations 07/01/2017  . Acute bronchiolitis due to human metapneumovirus   . Mild sleep apnea 06/15/2016  . Actinic keratosis 06/15/2016  . Nocturnal dyspnea 02/03/2016  . Skin lesion of face 02/03/2016  . Generalized weakness 01/17/2016  . Paresthesia of both hands  09/16/2015  . Phenytoin toxicity   . TIA (transient ischemic attack) 07/29/2015  . Vertigo 07/29/2015  . Schizoaffective disorder, depressive type with good prognostic features ([place]) 07/13/2015  . Constipation   . Verruca 05/18/2015  . Seborrheic dermatitis of scalp 05/18/2015  . MDD (major depressive disorder), recurrent severe, without psychosis ([place])   . OCD (obsessive compulsive disorder)   . Sepsis ([place]) 01/02/2015  . Confusion 12/31/2014  . Seizures ([place])   . Schizoaffective disorder, depressive type ([place])   . Orthostatic hypotension   . Fall   . Seizure ([place]) 11/18/2014  . Schizoaffective disorder, unspecified type ([place])   . Essential hypertension   . Overdose of beta-adrenergic antagonist drug 10/22/2014  . Overdose   . Nocturnal enuresis 03/23/2014  . Benzodiazepine dependence, continuous ([place]) 01/27/2014  . Severe major depression ([place]) 01/21/2014  . Frozen shoulder syndrome 09/20/2013  . Somnolence 09/20/2013  . Schizoaffective disorder ([place]) 09/20/2013  . Malaise and fatigue 04/21/2013  . Presbyopia 04/25/2012  . Androgen deficiency 09/07/2011  . Constipation, chronic 09/07/2011  . Other specified disorders of liver 12/30/2008  . Overweight(278.02) 09/08/2008  . Hyperlipidemia 01/28/2008  . Seizure disorder ([place]) 01/28/2008  . OSTEOPENIA 07/01/2007    Past Surgical History:  Procedure Laterality Date  . CARPAL TUNNEL RELEASE    . COLONOSCOPY  2010  . ORIF SHOULDER FRACTURE    . self orchectomy    .  SHOULDER CLOSED REDUCTION Left 09/16/2013   Procedure: CLOSED REDUCTION SHOULDER;  Surgeon: Mauri Pole, MD;  Location: [facility];  Service: Orthopedics;  Laterality: Left;  . SHOULDER HEMI-ARTHROPLASTY Left 09/18/2013   Procedure: LEFT SHOULDER HEMI-ARTHROPLASTY;  Surgeon: Augustin Schooling, MD;  Location: [facility];  Service: Orthopedics;  Laterality: Left;       Home Medications    Prior to Admission medications   Medication Sig Start Date End Date Taking?  Authorizing Provider  amitriptyline (ELAVIL) 100 MG tablet Take 2 tablets (200 mg total) at bedtime by mouth. 10/29/17   Arfeen, Arlyce Harman, MD  amLODipine (NORVASC) 5 MG tablet Take 1 tablet (5 mg total) by mouth daily. 07/26/17   Tonette Bihari, MD  aspirin EC 81 MG tablet Take 1 tablet (81 mg total) by mouth daily. 07/21/15   Withrow, Elyse Jarvis, FNP  atorvastatin (LIPITOR) 40 MG tablet Take 1 tablet (40 mg total) by mouth daily. 11/05/17   Leeanne Rio, MD  clonazePAM (KLONOPIN) 1 MG tablet Take 1 tablet (1 mg total) 3 (three) times daily by mouth. 10/29/17   Arfeen, Arlyce Harman, MD  [facility] SENNA PLUS 8.6-50 MG tablet TAKE 1 TABLET BY MOUTH AT BEDTIME AS NEEDED FOR MILD CONSTIPATION 08/29/16   Leeanne Rio, MD  hydrochlorothiazide (HYDRODIURIL) 25 MG tablet Take 1 tablet (25 mg total) by mouth daily. 07/26/17   Mikell, Jeani Sow, MD  Lacosamide (VIMPAT) 150 MG TABS Take 1 tablet (150 mg total) by mouth 2 (two) times daily. 05/06/17   Kathrynn Ducking, MD  losartan (COZAAR) 50 MG tablet Take 1 tablet (50 mg total) by mouth daily. 09/27/17 12/26/17  Martinique, Peter M, MD  metoprolol succinate (TOPROL-XL) 100 MG 24 hr tablet Take 2 tablets (200 mg total) daily by mouth. 10/29/17   Leeanne Rio, MD  Multiple Vitamin (MULTIVITAMIN WITH MINERALS) TABS tablet Take 1 tablet by mouth daily.    [provider]  phenytoin (DILANTIN) 100 MG ER capsule TAKE 2 CAPSULES BY MOUTH AT NIGHT 03/01/17   Kathrynn Ducking, MD  phenytoin (DILANTIN) 50 MG tablet Chew 1 tablet (50 mg total) by mouth at bedtime. 03/01/17   Kathrynn Ducking, MD  polyethylene glycol powder (GLYCOLAX/MIRALAX) powder TAKE 17 GRAMS 2 TIMES A DAY AS NEEDED FOR CONSTIPATION. MIX INTO 8 OF FLUID AND DRINK 02/27/17   Leeanne Rio, MD  QUEtiapine (SEROQUEL) 300 MG tablet Take 2 tablets (600 mg total) at bedtime by mouth. 10/29/17   Arfeen, Arlyce Harman, MD  testosterone cypionate (DEPOTESTOSTERONE CYPIONATE) 200 MG/ML injection  Inject 0.75 mLs (150 mg total) into the muscle every 14 (fourteen) days. 09/12/16   Leeanne Rio, MD    Family History Family History  Problem Relation Age of Onset  . Stroke Father        Living at 25  . Stroke Mother        Stroke in late 74's. Died in her 43's  . Alcohol abuse Brother   . Alcohol abuse Brother   . Diabetes Brother     Social History Social History   Tobacco Use  . Smoking status: Never Smoker  . Smokeless tobacco: Never Used  Substance Use Topics  . Alcohol use: No    Alcohol/week: 0.0 oz  . Drug use: No     Allergies   Codeine; Sulfa antibiotics; and Sulfasalazine   Review of Systems Review of Systems  Reason unable to perform ROS: see HPI as above.  Physical Exam Triage Vital Signs ED Triage Vitals  Enc Vitals Group     BP 12/13/17 1537 112/61     Pulse Rate 12/13/17 1537 69     Resp 12/13/17 1537 16     Temp 12/13/17 1537 98.2 F (36.8 C)     Temp Source 12/13/17 1537 Oral     SpO2 12/13/17 1537 97 %     Weight 12/13/17 1537 205 lb (93 kg)     Height 12/13/17 1537 5\' 11"  (1.803 m)     Head Circumference --      Peak Flow --      Pain Score 12/13/17 1538 0     Pain Loc --      Pain Edu? --      Excl. in [place]? --    No data found.  Updated Vital Signs BP 112/61   Pulse 69   Temp 98.2 F (36.8 C) (Oral)   Resp 16   Ht 5\' 11"  (1.803 m)   Wt 205 lb (93 kg)   SpO2 97%   BMI 28.59 kg/m   Physical Exam  Constitutional: He is oriented to person, place, and time. He appears well-developed and well-nourished. No distress.  HENT:  Head: Normocephalic and atraumatic.  Right Ear: Tympanic membrane, external ear and ear canal normal. Tympanic membrane is not erythematous and not bulging. No hemotympanum.  Left Ear: Tympanic membrane, external ear and ear canal normal. Tympanic membrane is not erythematous and not bulging. No hemotympanum.  Eyes: Conjunctivae and EOM are normal. Pupils are equal, round, and reactive to  light.  Neurological: He is alert and oriented to person, place, and time. He has normal strength. He is not disoriented. No cranial nerve deficit or sensory deficit. He displays a negative Romberg sign. Coordination and gait normal. GCS eye subscore is 4. GCS verbal subscore is 5. GCS motor subscore is 6.  Normal  rapid movement, finger to nose.  Skin:  4 cm laceration on the left temporal region.  Covered in dried blood.     UC Treatments / Results  Labs (all labs ordered are listed, but only abnormal results are displayed) Labs Reviewed - No data to display  EKG  EKG Interpretation None       Radiology No results found.  Procedures Laceration Repair Date/Time: 12/13/2017 4:53 PM Performed by: Ok Edwards, PA-C Authorized by: Wynona Luna, MD   Consent:    Consent obtained:  Verbal   Consent given by:  Patient   Risks discussed:  Infection, pain, poor cosmetic result and poor wound healing   Alternatives discussed:  Referral Anesthesia (see MAR for exact dosages):    Anesthesia method:  None Laceration details:    Location:  Scalp   Scalp location:  L temporal   Length (cm):  4   Depth (mm):  3 Repair type:    Repair type:  Simple Exploration:    Hemostasis achieved with:  Direct pressure Treatment:    Area cleansed with:  Saline   Amount of cleaning:  Extensive   Irrigation solution:  Sterile saline (wound cleaner)   Irrigation method:  Pressure wash Skin repair:    Repair method:  Staples   Number of staples:  6 Post-procedure details:    Dressing:  Antibiotic ointment and bulky dressing   Patient tolerance of procedure:  Tolerated well, no immediate complications   (including critical care time)  Medications Ordered in UC Medications  Tdap (BOOSTRIX) injection 0.5 mL (  0.5 mLs Intramuscular Given 12/13/17 1647)     Initial Impression / Assessment and Plan / UC Course  I have reviewed the triage vital signs and the nursing notes.  Pertinent  labs & imaging results that were available during my care of the patient were reviewed by me and considered in my medical decision making (see chart for details).    Patient tolerated procedure well.  Tdap updated today.  Wound care instructions given.  Patient with normal neurological exam, without headache, blurry vision, nausea, vomiting, weakness, dizziness, facial drooping.  He was able to drive himself to the clinic and walk into the exam room unassisted.  He takes aspirin 81 mg, no other blood thinner use.  Will have patient monitor for now, strict return precautions given.  Patient to follow-up here or with PCP in 10 days for staple removal.  Patient expresses understanding and agrees to plan.  Final Clinical Impressions(s) / UC Diagnoses   Final diagnoses:  Laceration of scalp, initial encounter    ED Discharge Orders    None        Arturo Morton 12/13/17 1657

## 2017-12-13 NOTE — ED Triage Notes (Signed)
PT tripped and fell last night at 11pm. PT has a laceration to left side of head. PT was seen by cardiologist this AM and was instructed to come here to get sutured. No LOC last night.

## 2017-12-13 NOTE — Assessment & Plan Note (Addendum)
On medication. B/P controlled today.

## 2017-12-13 NOTE — Assessment & Plan Note (Signed)
Followed by Dr [name]. The episode yesterday did not sound like a seizure. He was aware of everything. He was not incontinent.

## 2017-12-13 NOTE — Assessment & Plan Note (Signed)
Pt seen in the office today. He walked in with dried blood all over his face from a fall at home yesterday.

## 2017-12-20 NOTE — Addendum Note (Signed)
Addended by: Vennie Homans on: 12/20/2017 05:23 PM   Modules accepted: Orders

## 2017-12-30 ENCOUNTER — Ambulatory Visit (HOSPITAL_COMMUNITY): Payer: Self-pay | Admitting: Psychiatry

## 2018-01-01 ENCOUNTER — Telehealth: Payer: Self-pay | Admitting: *Deleted

## 2018-01-01 ENCOUNTER — Ambulatory Visit (HOSPITAL_COMMUNITY)
Admission: EM | Admit: 2018-01-01 | Discharge: 2018-01-01 | Disposition: A | Discharge status: Home / Self Care | Payer: Medicaid Other | Attending: Family Medicine | Admitting: Family Medicine

## 2018-01-01 ENCOUNTER — Encounter (HOSPITAL_COMMUNITY): Payer: Self-pay | Admitting: Emergency Medicine

## 2018-01-01 NOTE — Telephone Encounter (Signed)
Patient left message on nurse line requesting refill on medicated shampoo as scalp fungus has returned. Hubbard Hartshorn, RN, BSN

## 2018-01-01 NOTE — ED Triage Notes (Signed)
Here for suture removal .... Voices no other concerns.   A&O X4... NAD... Ambulatory

## 2018-01-02 NOTE — Telephone Encounter (Signed)
Patient can use over the counter Selsun shampoo. He should get the kind with selenium sulfide as the active ingredient. If no improvement with this, we'd need to see him to evaluate it since it looks like that was diagnosed about 3 years ago.  Please let patient know  Thanks Leeanne Rio, MD

## 2018-01-03 NOTE — Telephone Encounter (Signed)
Patient notified of info below. L. Drayson Dorko, RN, BSN   

## 2018-01-14 ENCOUNTER — Ambulatory Visit (HOSPITAL_COMMUNITY): Payer: Self-pay | Admitting: Licensed Clinical Social Worker

## 2018-01-14 ENCOUNTER — Encounter (HOSPITAL_COMMUNITY): Payer: Self-pay | Admitting: Licensed Clinical Social Worker

## 2018-01-14 ENCOUNTER — Ambulatory Visit (INDEPENDENT_AMBULATORY_CARE_PROVIDER_SITE_OTHER): Payer: Medicaid Other | Admitting: Licensed Clinical Social Worker

## 2018-01-14 DIAGNOSIS — F251 Schizoaffective disorder, depressive type: Secondary | ICD-10-CM | POA: Diagnosis not present
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
## 2018-01-14 NOTE — Progress Notes (Signed)
   THERAPIST PROGRESS NOTE  Session Time: 3:40pm-4:30pm  Participation Level: Active  Behavioral Response: DisheveledConfusedDepressed   Type of Therapy: Individual Therapy  Treatment Goals addressed: improve psychiatric symptoms, Elevate mood, improve faulty thinking, interpersonal relationship skills, reduce self-injury  Interventions: CBT and Motivational Interviewing  Summary: [name] is a 62 y.o male who presents with Schizoaffective disorder, depressive type. .   Suicidal/Homicidal: No -without intent/plan  Therapist Response: [name] met clinician for an individual session. [name] discussed his psychiatric symptoms, his current life events and his homework. [name] reports he has been depressed, not getting out of bed, keeping the shades drawn, and having no motivation to get up. He reported no hallucinations at this time. However, he remained focused on his transcendence from duality when he was a young man. He denied that depression is due to season, but he also reports that he does not get any sun or outside time. No self injury was reported for a few months.   Plan: Return again in 1-2 weeks.  Diagnosis: Axis I: Schizoaffective disorder, depressive type.  Jobe Marker Lucas, LCSW 01/14/2018

## 2018-01-21 ENCOUNTER — Other Ambulatory Visit: Payer: Self-pay | Admitting: Neurology

## 2018-01-22 NOTE — Telephone Encounter (Signed)
Faxed printed/signed rx Vimpat to [facility] at [phone]. Received fax confirmation.

## 2018-01-25 ENCOUNTER — Other Ambulatory Visit (HOSPITAL_COMMUNITY): Payer: Self-pay | Admitting: Psychiatry

## 2018-01-25 DIAGNOSIS — F251 Schizoaffective disorder, depressive type: Secondary | ICD-10-CM

## 2018-02-13 ENCOUNTER — Other Ambulatory Visit: Payer: Self-pay | Admitting: Family Medicine

## 2018-02-14 NOTE — Telephone Encounter (Signed)
Red team, please ask patient to schedule follow up visit with me, it's been >1 year since I've seen him. Thanks! Leeanne Rio, MD

## 2018-02-17 NOTE — Telephone Encounter (Signed)
Called patient to make appointment per Dr. Ardelia Mems and there was no answer. Voice mail message was left asking patient to call and make an appointment.Ozella Almond, CMA

## 2018-02-24 ENCOUNTER — Other Ambulatory Visit: Payer: Self-pay | Admitting: *Deleted

## 2018-02-25 ENCOUNTER — Ambulatory Visit (INDEPENDENT_AMBULATORY_CARE_PROVIDER_SITE_OTHER): Payer: Medicaid Other | Admitting: Psychiatry

## 2018-02-25 ENCOUNTER — Encounter (HOSPITAL_COMMUNITY): Payer: Self-pay | Admitting: Psychiatry

## 2018-02-25 DIAGNOSIS — F419 Anxiety disorder, unspecified: Secondary | ICD-10-CM

## 2018-02-25 DIAGNOSIS — Z915 Personal history of self-harm: Secondary | ICD-10-CM | POA: Diagnosis not present

## 2018-02-25 DIAGNOSIS — Z818 Family history of other mental and behavioral disorders: Secondary | ICD-10-CM

## 2018-02-25 DIAGNOSIS — F251 Schizoaffective disorder, depressive type: Secondary | ICD-10-CM | POA: Diagnosis not present

## 2018-02-25 DIAGNOSIS — Z79899 Other long term (current) drug therapy: Secondary | ICD-10-CM

## 2018-02-25 MED ORDER — QUETIAPINE FUMARATE 300 MG PO TABS: 600.0000 mg | ORAL_TABLET | Freq: Every day | ORAL | 2 refills | Status: DC

## 2018-02-25 MED ORDER — CLONAZEPAM 1 MG PO TABS: 1.0000 mg | ORAL_TABLET | Freq: Two times a day (BID) | ORAL | 2 refills | Status: DC

## 2018-02-25 MED ORDER — AMITRIPTYLINE HCL 100 MG PO TABS: 200.0000 mg | ORAL_TABLET | Freq: Every day | ORAL | 2 refills | Status: DC

## 2018-02-25 NOTE — Progress Notes (Signed)
[facility] MD/PA/NP OP Progress Note  02/25/2018 2:23 PM RYKAR LEBLEU  MRN:  [id number]  Chief Complaint: I am doing better.  I still have anxiety and depression but medicine working.  I cut down my Klonopin and taking only twice a day.  HPI: [name] came for his follow-up appointment.  He apologized missing his last appointment.  He is taking his medication but also reported that he cut down his Klonopin and taking only 1 mg twice a day.  He still have anxiety and nervousness but he feels tired when he takes 3 times a day.  He has chronic paranoia and anxiety but denies any recent hallucination or any suicidal thoughts.  He is taking Dilantin and is happy that he has no longer seizures.  Patient lives by himself.  Sometime he does not leave his house because of anxiety and feeling scared with the strangers.  Patient denies any mania, aggressive behavior.  He is not drinking or using any illegal substances.  He is taking amitriptyline Seroquel as prescribed.  He is sleeping good.  His attention and concentration is fair.  He remember going to the emergency room in December after he fell.  He remember he tripped.  He got had laceration.  Patient is not engaged in recent self abusive behavior.  He is seeing [name] for counseling.  Patient denies drinking alcohol or using any illegal substances.  Visit Diagnosis:    ICD-10-CM   1. Schizoaffective disorder, depressive type with good prognostic features (HCC) F25.1 QUEtiapine (SEROQUEL) 300 MG tablet    amitriptyline (ELAVIL) 100 MG tablet    clonazePAM (KLONOPIN) 1 MG tablet    Past Psychiatric History: Reviewed. Patient has depression since his college. He has at least 15 hospitalization due to severe depression, paranoia and suicidal thoughts. His last hospitalization was in August 2016. At that time he was thinking to kill himself by inhaling helium. He was discharged on Seroquel 900 mg and amitriptyline 400 mg at bedtime.He had history of suicidal attempt  when he cut his testicle because he does not want to be depressed. He was seeing [name] at [facility] and then moved to [name]. Patient has multiple trial of SSRIs however he had reaction to SSRIs. In the past he had tried nortriptyline, Haldol, Geodon, Seroquel, lithium, Prozac, Wellbutrin, Paxil, Ativan, Lexapro and Zoloft. Patient denies any history of mania or any drug use.  Past Medical History:  Past Medical History:  Diagnosis Date  . Anxiety   . Constipation   . Depression   . Dyslipidemia   . GERD (gastroesophageal reflux disease)   . Hearing loss   . History of echocardiogram    Echo 8/18: EF 50-55, normal wall motion, grade 1 diastolic dysfunction, mild MR  . Hypertension   . Memory difficulty 10/25/2017  . Memory loss   . MRSA carrier   . Schizoaffective disorder   . Seizures ([place])     Past Surgical History:  Procedure Laterality Date  . CARPAL TUNNEL RELEASE    . COLONOSCOPY  2010  . ORIF SHOULDER FRACTURE    . self orchectomy    . SHOULDER CLOSED REDUCTION Left 09/16/2013   Procedure: CLOSED REDUCTION SHOULDER;  Surgeon: Mauri Pole, MD;  Location: [facility];  Service: Orthopedics;  Laterality: Left;  . SHOULDER HEMI-ARTHROPLASTY Left 09/18/2013   Procedure: LEFT SHOULDER HEMI-ARTHROPLASTY;  Surgeon: Augustin Schooling, MD;  Location: [facility];  Service: Orthopedics;  Laterality: Left;    Family  Psychiatric History: Reviewed.  Family History:  Family History  Problem Relation Age of Onset  . Stroke Father        Living at 47  . Stroke Mother        Stroke in late 63's. Died in her 79's  . Alcohol abuse Brother   . Alcohol abuse Brother   . Diabetes Brother     Social History:  Social History   Socioeconomic History  . Marital status: Single    Spouse name: Not on file  . Number of children: 0  . Years of education: 38  . Highest education level: Not on file  Social Needs  . Financial resource strain: Not on file  .  Food insecurity - worry: Not on file  . Food insecurity - inability: Not on file  . Transportation needs - medical: Not on file  . Transportation needs - non-medical: Not on file  Occupational History  . Occupation: disabled  Tobacco Use  . Smoking status: Never Smoker  . Smokeless tobacco: Never Used  Substance and Sexual Activity  . Alcohol use: No    Alcohol/week: 0.0 oz  . Drug use: No  . Sexual activity: Not Currently    Partners: Female    Birth control/protection: None  Other Topics Concern  . Not on file  Social History Narrative   Patient does not drink caffeine.   Patient is left handed.    Allergies:  Allergies  Allergen Reactions  . Codeine Nausea And Vomiting  . Sulfa Antibiotics Nausea And Vomiting  . Sulfasalazine Nausea And Vomiting    Metabolic Disorder Labs: Lab Results  Component Value Date   HGBA1C 5.6 06/11/2016   MPG 111 07/30/2015   MPG 123 07/13/2015   Lab Results  Component Value Date   PROLACTIN 12.0 07/13/2015   Lab Results  Component Value Date   CHOL 183 02/18/2017   TRIG 138 02/18/2017   HDL 75 02/18/2017   CHOLHDL 2.4 02/18/2017   VLDL 28 02/18/2017   LDLCALC 80 02/18/2017   LDLCALC 62 07/30/2015   Lab Results  Component Value Date   TSH 2.644 07/13/2015   TSH 3.882 02/16/2015    Therapeutic Level Labs: No results found for: LITHIUM No results found for: VALPROATE No components found for:  CBMZ  Current Medications: Current Outpatient Medications  Medication Sig Dispense Refill  . amitriptyline (ELAVIL) 100 MG tablet TAKE 2 TABLETS (200 MG TOTAL) AT BEDTIME BY MOUTH. 60 tablet 0  . amLODipine (NORVASC) 5 MG tablet Take 1 tablet (5 mg total) by mouth daily. 90 tablet 3  . aspirin EC 81 MG tablet Take 1 tablet (81 mg total) by mouth daily.    Marland Kitchen atorvastatin (LIPITOR) 40 MG tablet Take 1 tablet (40 mg total) by mouth daily. 30 tablet 3  . clonazePAM (KLONOPIN) 1 MG tablet Take 1 tablet (1 mg total) 3 (three) times daily  by mouth. 90 tablet 1  . [facility] SENNA PLUS 8.6-50 MG tablet TAKE 1 TABLET BY MOUTH AT BEDTIME AS NEEDED FOR MILD CONSTIPATION 30 tablet 3  . hydrochlorothiazide (HYDRODIURIL) 25 MG tablet Take 1 tablet (25 mg total) by mouth daily. 90 tablet 3  . metoprolol succinate (TOPROL-XL) 100 MG 24 hr tablet Take 2 tablets (200 mg total) daily by mouth. 60 tablet 3  . Multiple Vitamin (MULTIVITAMIN WITH MINERALS) TABS tablet Take 1 tablet by mouth daily.    . phenytoin (DILANTIN) 100 MG ER capsule TAKE 2 CAPSULES BY MOUTH AT  NIGHT 180 capsule 3  . PHENYTOIN INFATABS 50 MG tablet CHEW 1 TABLET BY MOUTH AT BEDTIME 90 tablet 3  . polyethylene glycol powder (GLYCOLAX/MIRALAX) powder TAKE 17 GRAMS 2 TIMES A DAY AS NEEDED FOR CONSTIPATION. MIX INTO 8 OF FLUID AND DRINK 500 g 1  . QUEtiapine (SEROQUEL) 300 MG tablet TAKE 2 TABLETS (600 MG TOTAL) AT BEDTIME BY MOUTH. 60 tablet 0  . testosterone cypionate (DEPOTESTOSTERONE CYPIONATE) 200 MG/ML injection Inject 0.75 mLs (150 mg total) into the muscle every 14 (fourteen) days.    Marland Kitchen VIMPAT 150 MG TABS TAKE 1 TABLET BY MOUTH TWO TIMES DAILY 60 tablet 5  . losartan (COZAAR) 50 MG tablet Take 1 tablet (50 mg total) by mouth daily. 30 tablet 6   No current facility-administered medications for this visit.      Musculoskeletal: Strength & Muscle Tone: within normal limits Gait & Station: normal Patient leans: N/A  Psychiatric Specialty Exam: ROS  Blood pressure 126/72, pulse 80, height 5\' 11"  (1.803 m), weight 193 lb 12.8 oz (87.9 kg).Body mass index is 27.03 kg/m.  General Appearance: Fairly Groomed  Eye Contact:  Fair  Speech:  Slow  Volume:  Normal  Mood:  Anxious  Affect:  Congruent  Thought Process:  Goal Directed  Orientation:  Full (Time, Place, and Person)  Thought Content: Paranoid Ideation and Rumination   Suicidal Thoughts:  No  Homicidal Thoughts:  No  Memory:  Immediate;   Fair Recent;   Fair Remote;   Fair  Judgement:  Good  Insight:  Good   Psychomotor Activity:  Decreased  Concentration:  Concentration: Fair and Attention Span: Fair  Recall:  Good  Fund of Knowledge: Good  Language: Good  Akathisia:  No  Handed:  Right  AIMS (if indicated): not done  Assets:  Communication Skills Desire for Improvement Resilience  ADL's:  Intact  Cognition: Impaired,  Mild  Sleep:  Fair   Screenings: AIMS     Admission (Discharged) from 07/13/2015 in [facility]  AIMS Total Score  0    AUDIT     Admission (Discharged) from 07/13/2015 in [facility] Admission (Discharged) from 05/08/2014 in [facility] Admission (Discharged) from 01/21/2014 in [facility]  Alcohol Use Disorder Identification Test Final Score (AUDIT)  0  0  0    Mini-Mental     Office Visit from 10/25/2017 in [facility]  Total Score (max 30 points )  25    PHQ2-9     Office Visit from 07/26/2017 in [facility] Office Visit from 05/02/2017 in [facility] Office Visit from 01/22/2017 in [facility] Office Visit from 06/11/2016 in [facility] Office Visit from 01/30/2016 in [facility]  PHQ-2 Total Score  6  0  6  0  0  PHQ-9 Total Score  No data  No data  8  No data  No data       Assessment and Plan: Schizoaffective disorder, bipolar type.  Anxiety disorder NOS.  Patient has been stable on his current psychiatric medication.  He has chronic paranoia and anxiety but overall he is feeling better.  He cut down his Klonopin and taking 1 mg twice a day because third dose causing him dizzy.  Continue Seroquel 600 mg at bedtime and amitriptyline 200 mg at bedtime.  Patient  is reluctant to cut down these medication.  Encourage counseling with [name] for CBT.  Recommended to call us back if is any question or any concern.   Follow-up in 3 months.  Discussed safety concerns at any time having active suicidal thoughts or homicidal thought that he need to call 911 or go to local emergency room.   Kathlee Nations, MD 02/25/2018, 2:23 PM

## 2018-02-27 MED ORDER — POLYETHYLENE GLYCOL 3350 17 GM/SCOOP PO POWD: ORAL | 1 refills | Status: DC

## 2018-03-06 ENCOUNTER — Encounter (HOSPITAL_COMMUNITY): Payer: Self-pay | Admitting: Licensed Clinical Social Worker

## 2018-03-06 ENCOUNTER — Ambulatory Visit (INDEPENDENT_AMBULATORY_CARE_PROVIDER_SITE_OTHER): Payer: Medicaid Other | Admitting: Licensed Clinical Social Worker

## 2018-03-06 DIAGNOSIS — F251 Schizoaffective disorder, depressive type: Secondary | ICD-10-CM

## 2018-03-06 NOTE — Progress Notes (Signed)
   THERAPIST PROGRESS NOTE  Session Time: 3:30pm-4:30pm  Participation Level: Active  Behavioral Response: DisheveledAlertDepressed   Type of Therapy: Individual Therapy  Treatment Goals addressed: improve psychiatric symptoms, Elevate mood, improve faulty thinking, interpersonal relationship skills, reduce self injury  Interventions: CBT and Motivational Interviewing  Summary: DALESSANDRO BALDYGA is a 62 y.o male who presents with Schizoaffective disorder, depressive type. .   Suicidal/Homicidal: No -without intent/plan  Therapist Response: Lamarkus met clinician for an individual session. Khalen discussed his psychiatric symptoms, his current life events and his homework. Phyllis reports he has started opening his window shades at home at the suggestion of clinician at last session. He reports he has not seen much difference in his mood. However, he was willing to try and will continue to open them daily. Merrit reports his depression is out of control, he spends much of his day in bed, but not sleeping. He reports he feels no motivation to do anything other than lay there and watch tv sometimes. Clinician utilized CBT, provided psychoeducation to identify thoughts, feelings, and bxs. Clinician explored previous attempts at doing things to improve mood. Clinician offered music as an option and played "Let It Be" in order to attempt a change in emotional state. Conley reported the music was "distracting". Clinician identified the value of being distracted, especially when the distraction is from depressed thoughts. Walfred agreed to incorporate some music into his days.   Plan: Return again in 3-4 weeks.  Diagnosis: Axis I: Schizoaffective disorder, depressive type.  Jobe Marker Hosmer, LCSW 03/06/2018

## 2018-03-11 ENCOUNTER — Ambulatory Visit: Payer: Self-pay | Admitting: Family Medicine

## 2018-03-18 ENCOUNTER — Ambulatory Visit (HOSPITAL_COMMUNITY): Payer: Medicaid Other | Admitting: Licensed Clinical Social Worker

## 2018-03-21 ENCOUNTER — Other Ambulatory Visit: Payer: Self-pay | Admitting: Cardiology

## 2018-03-21 ENCOUNTER — Other Ambulatory Visit: Payer: Self-pay | Admitting: Family Medicine

## 2018-03-21 DIAGNOSIS — I10 Essential (primary) hypertension: Secondary | ICD-10-CM

## 2018-04-01 ENCOUNTER — Ambulatory Visit: Payer: Medicaid Other | Admitting: Family Medicine

## 2018-04-01 VITALS — BP 122/70 | HR 62 | Temp 98.0°F | Ht 71.0 in | Wt 188.8 lb

## 2018-04-01 DIAGNOSIS — R634 Abnormal weight loss: Secondary | ICD-10-CM | POA: Diagnosis not present

## 2018-04-01 DIAGNOSIS — E785 Hyperlipidemia, unspecified: Secondary | ICD-10-CM

## 2018-04-01 DIAGNOSIS — I10 Essential (primary) hypertension: Secondary | ICD-10-CM

## 2018-04-01 LAB — POCT GLYCOSYLATED HEMOGLOBIN (HGB A1C): Hemoglobin A1C: 6

## 2018-04-01 MED ORDER — ONDANSETRON HCL 4 MG PO TABS: 4.0000 mg | ORAL_TABLET | Freq: Three times a day (TID) | ORAL | 0 refills | Status: DC | PRN

## 2018-04-01 NOTE — Progress Notes (Signed)
Date of Visit: 04/01/2018   HPI:  Patient presents to discuss nausea and weight loss.  Weight loss/nausea - Has felt nauseated and lost weight over the last few months. No abdominal pain. Just has decreased appetite and doesn't feel hungry. Nauseated but not vomiting. No fevers, cough, or blood in stool. Weight loss is unintentional. Also began itching all over about 2 weeks ago. No chest pain or shortness of breath. No family history of cancer. Does not smoke. Mentions also having a sensation of his socks being on even when not wearing socks. Followed by Dr. Adele Schilder with psychiatry, compliant with medications, states mood is doing ok. No SI/HI. He would like to be checked for diabetes.  No recent seizures.  Hypertension - taking amlodipine 5mg  daily, HCTZ 25mg  daily, losartan 50mg  daily, metoprolol XL 200mg  daily. No chest pain or shortness of breath.   Hyperlipidemia - taking atorv 40mg  daily, tolerating well.  ROS: See HPI.  [place]: history of seizures, schizoaffective d/o, hypertension, hyperlipidemia, low testosterone, chronic constipation  PHYSICAL EXAM: BP 122/70 (BP Location: Left Arm, Patient Position: Sitting, Cuff Size: Normal)   Pulse 62   Temp 98 F (36.7 C) (Oral)   Ht 5\' 11"  (1.803 m)   Wt 188 lb 12.8 oz (85.6 kg)   SpO2 95%   BMI 26.33 kg/m  Gen: no acute distress, pleasant, cooperative HEENT: normocephalic, atraumatic, moist mucous membranes, no lymphadenopathy anterior cervical or supraclavicular. No scleral icterus. Heart: regular rate and rhythm, no murmur Lungs: clear to auscultation bilaterally, normal work of breathing Abdomen: soft, nondistended. Normoactive bowel sounds. No masses or organomegaly appreciated. Axilla: no lymphadenopathy appreciated in either axilla Skin: no rashes or jaundice Neuro: alert, grossly nonfocal, speech normal Ext: No appreciable lower extremity edema bilaterally   ASSESSMENT/PLAN:  Health maintenance:  -current on HM  items  Unintentional weight loss New onset unintentional weight loss and nausea. Exam unremarkable, other than clothes fitting looser than they used to. Given patient also reporting itching, greatest concern would be malignancy of pancreatic origin. Patient is current on age appropriate cancer screenings (has PSA monitored through urologist, who rx's testosteorne for him). due for next colonoscopy in Sept 2020 though warrants sooner eval given unintentional weight loss. Discussed options for workup with patient.  Plan is as follows: - labs today: CBC diff, A1c, TSH, CMET, HIV - CT abdomen and pelvis - refer to GI - rx zofran for nausea - follow up with me in 6 weeks to review workup & GI recommendations   Hyperlipidemia Update lipids with labs today.  Essential hypertension At goal, continue current medications. Checking labs today.  FOLLOW UP: Follow up in 6 weeks above issues Referring to GI.   [place]. Ardelia Mems, Kyle

## 2018-04-01 NOTE — Patient Instructions (Addendum)
Checking CT scan to look for why you are so nauseated. Sent in zofran for you. Referring to GI specialist.  Checking labwork today - kidneys, liver, blood counts, cholesterol, diabetes, thyroid  Follow up with me in 6 weeks, sooner if needed.  Be well, Dr. Ardelia Mems

## 2018-04-03 LAB — LIPID PANEL
Chol/HDL Ratio: 2.6 ratio (ref 0.0–5.0)
Cholesterol, Total: 141 mg/dL (ref 100–199)
HDL: 55 mg/dL (ref >39)
LDL Calculated: 63 mg/dL (ref 0–99)
Triglycerides: 116 mg/dL (ref 0–149)
VLDL Cholesterol Cal: 23 mg/dL (ref 5–40)

## 2018-04-03 LAB — CBC WITH DIFFERENTIAL/PLATELET
Basophils Absolute: 0 10*3/uL (ref 0.0–0.2)
Basos: 0 %
EOS (ABSOLUTE): 0.1 10*3/uL (ref 0.0–0.4)
Eos: 1 %
Hematocrit: 50.6 % (ref 37.5–51.0)
Hemoglobin: 17.8 g/dL — ABNORMAL HIGH (ref 13.0–17.7)
Immature Grans (Abs): 0 10*3/uL (ref 0.0–0.1)
Immature Granulocytes: 0 %
Lymphocytes Absolute: 1.8 10*3/uL (ref 0.7–3.1)
Lymphs: 25 %
MCH: 33.1 pg — ABNORMAL HIGH (ref 26.6–33.0)
MCHC: 35.2 g/dL (ref 31.5–35.7)
MCV: 94 fL (ref 79–97)
Monocytes Absolute: 0.3 10*3/uL (ref 0.1–0.9)
Monocytes: 4 %
Neutrophils Absolute: 5.2 10*3/uL (ref 1.4–7.0)
Neutrophils: 70 %
Platelets: 208 10*3/uL (ref 150–379)
RBC: 5.37 x10E6/uL (ref 4.14–5.80)
RDW: 14.8 % (ref 12.3–15.4)
WBC: 7.4 10*3/uL (ref 3.4–10.8)

## 2018-04-03 LAB — CMP14+EGFR
ALT: 17 IU/L (ref 0–44)
AST: 22 IU/L (ref 0–40)
Albumin/Globulin Ratio: 1.7 (ref 1.2–2.2)
Albumin: 4.8 g/dL (ref 3.6–4.8)
Alkaline Phosphatase: 109 IU/L (ref 39–117)
BUN/Creatinine Ratio: 22 (ref 10–24)
BUN: 20 mg/dL (ref 8–27)
Bilirubin Total: 0.4 mg/dL (ref 0.0–1.2)
CO2: 16 mmol/L — ABNORMAL LOW (ref 20–29)
Calcium: 9.6 mg/dL (ref 8.6–10.2)
Chloride: 100 mmol/L (ref 96–106)
Creatinine, Ser: 0.91 mg/dL (ref 0.76–1.27)
GFR calc Af Amer: 103 mL/min/{1.73_m2} (ref >59)
GFR calc non Af Amer: 89 mL/min/{1.73_m2} (ref >59)
Globulin, Total: 2.8 g/dL (ref 1.5–4.5)
Glucose: 112 mg/dL — ABNORMAL HIGH (ref 65–99)
Potassium: 4.7 mmol/L (ref 3.5–5.2)
Sodium: 140 mmol/L (ref 134–144)
Total Protein: 7.6 g/dL (ref 6.0–8.5)

## 2018-04-03 LAB — TSH: TSH: 2.1 u[IU]/mL (ref 0.450–4.500)

## 2018-04-03 LAB — HIV ANTIBODY (ROUTINE TESTING W REFLEX): HIV Screen 4th Generation wRfx: NONREACTIVE

## 2018-04-04 ENCOUNTER — Telehealth: Payer: Self-pay | Admitting: Family Medicine

## 2018-04-04 NOTE — Telephone Encounter (Signed)
Attempted to reach patient to discuss labs. No answer. LVM asking him to call back. I would like to speak with him when he calls back.  Leeanne Rio, MD

## 2018-04-07 DIAGNOSIS — R634 Abnormal weight loss: Secondary | ICD-10-CM | POA: Insufficient documentation

## 2018-04-07 NOTE — Assessment & Plan Note (Addendum)
New onset unintentional weight loss and nausea. Exam unremarkable, other than clothes fitting looser than they used to. Given patient also reporting itching, greatest concern would be malignancy of pancreatic origin. Patient is current on age appropriate cancer screenings (has PSA monitored through urologist, who rx's testosteorne for him). due for next colonoscopy in Sept 2020 though warrants sooner eval given unintentional weight loss. Discussed options for workup with patient.  Plan is as follows: - labs today: CBC diff, A1c, TSH, CMET, HIV - CT abdomen and pelvis - refer to GI - rx zofran for nausea - follow up with me in 6 weeks to review workup & GI recommendations

## 2018-04-07 NOTE — Assessment & Plan Note (Signed)
At goal, continue current medications. Checking labs today.

## 2018-04-07 NOTE — Assessment & Plan Note (Signed)
Update lipids with labs today.

## 2018-04-08 ENCOUNTER — Ambulatory Visit (HOSPITAL_COMMUNITY): Admission: RE | Admit: 2018-04-08 | Payer: Medicaid Other | Source: Ambulatory Visit

## 2018-04-09 ENCOUNTER — Other Ambulatory Visit: Payer: Self-pay | Admitting: *Deleted

## 2018-04-09 ENCOUNTER — Encounter: Payer: Self-pay | Admitting: Gastroenterology

## 2018-04-09 DIAGNOSIS — E785 Hyperlipidemia, unspecified: Secondary | ICD-10-CM

## 2018-04-09 NOTE — Telephone Encounter (Signed)
Called patient back, no answer, left another VM asking him to call back. Leeanne Rio, MD

## 2018-04-09 NOTE — Telephone Encounter (Signed)
Pt left message on nurse line returning call. His call back [phone] Wallace Cullens, RN

## 2018-04-10 ENCOUNTER — Ambulatory Visit: Admission: RE | Admit: 2018-04-10 | Payer: Medicaid Other | Source: Ambulatory Visit

## 2018-04-10 MED ORDER — ATORVASTATIN CALCIUM 40 MG PO TABS: 40.0000 mg | ORAL_TABLET | Freq: Every day | ORAL | 3 refills | Status: DC

## 2018-04-15 ENCOUNTER — Other Ambulatory Visit: Payer: Self-pay | Admitting: Family Medicine

## 2018-04-15 DIAGNOSIS — I10 Essential (primary) hypertension: Secondary | ICD-10-CM

## 2018-04-18 ENCOUNTER — Ambulatory Visit
Admission: RE | Admit: 2018-04-18 | Discharge: 2018-04-18 | Disposition: A | Discharge status: Home / Self Care | Payer: Medicaid Other | Source: Ambulatory Visit | Attending: Family Medicine | Admitting: Family Medicine

## 2018-04-18 DIAGNOSIS — D1803 Hemangioma of intra-abdominal structures: Secondary | ICD-10-CM | POA: Insufficient documentation

## 2018-04-18 DIAGNOSIS — R634 Abnormal weight loss: Secondary | ICD-10-CM | POA: Insufficient documentation

## 2018-04-18 MED ORDER — IOPAMIDOL (ISOVUE-300) INJECTION 61%
100.0000 mL | Freq: Once | INTRAVENOUS | Status: AC | PRN
  Administered 2018-04-18: 100 mL via INTRAVENOUS

## 2018-04-24 ENCOUNTER — Ambulatory Visit: Payer: Medicaid Other | Admitting: Adult Health

## 2018-04-24 ENCOUNTER — Encounter (HOSPITAL_COMMUNITY): Payer: Self-pay | Admitting: Licensed Clinical Social Worker

## 2018-04-24 ENCOUNTER — Ambulatory Visit (INDEPENDENT_AMBULATORY_CARE_PROVIDER_SITE_OTHER): Payer: Medicaid Other | Admitting: Licensed Clinical Social Worker

## 2018-04-24 DIAGNOSIS — F251 Schizoaffective disorder, depressive type: Secondary | ICD-10-CM

## 2018-04-24 NOTE — Progress Notes (Signed)
   THERAPIST PROGRESS NOTE  Session Time: 4:30pm-5:30pm  Participation Level: Active  Behavioral Response: DisheveledAlert and ConfusedDepressed  Type of Therapy: Individual Therapy  Treatment Goals addressed: improve psychiatric symptoms, Elevate mood, improve faulty thinking, interpersonal relationship skills, reduce self injury  Interventions: CBT and Motivational Interviewing  Summary: [name] is a 62 y.o male who presents with Schizoaffective disorder, depressive type. .   Suicidal/Homicidal: No -without intent/plan  Therapist Response: [name] met [name] for an individual session. [name] discussed his psychiatric symptoms, his current life events and his homework. [name] reports he is emotionally numb, which bothers him a great deal. 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 [name] reports that due to the numbness, he feels no emotional pain, but can also feel no happiness or joy in life. [name] utilized MI OARS to reflect and summarize thoughts and feelings. [name] also provided CBT psychoeducation about connections between thoughts, feelings, and behaviors. [name] encouraged [name] to start trying to do things that are happy and that may lead to some positive experiences. [name] reported that he did not feel this would be useful, but he agreed to attempt some minimal exercise, such as leg lifts or gentle stretching. [name] reports he spends the majority of each day laying down in bed.  [name] also processed some of his past hx of trauma with older brother.    Plan: Return again in 3-4  weeks.  Diagnosis: Axis I: Schizoaffective disorder, depressive type.  [place], LCSW 04/24/2018

## 2018-05-08 ENCOUNTER — Ambulatory Visit (HOSPITAL_COMMUNITY): Payer: Self-pay | Admitting: Licensed Clinical Social Worker

## 2018-05-09 ENCOUNTER — Other Ambulatory Visit: Payer: Self-pay | Admitting: Family Medicine

## 2018-05-09 DIAGNOSIS — I10 Essential (primary) hypertension: Secondary | ICD-10-CM

## 2018-05-11 ENCOUNTER — Other Ambulatory Visit: Payer: Self-pay | Admitting: Neurology

## 2018-05-15 ENCOUNTER — Other Ambulatory Visit: Payer: Self-pay

## 2018-05-15 ENCOUNTER — Ambulatory Visit: Payer: Medicaid Other | Admitting: Adult Health

## 2018-05-15 ENCOUNTER — Encounter: Payer: Self-pay | Admitting: Adult Health

## 2018-05-15 VITALS — BP 103/67 | HR 66 | Ht 71.0 in | Wt 186.0 lb

## 2018-05-15 DIAGNOSIS — R569 Unspecified convulsions: Secondary | ICD-10-CM

## 2018-05-15 DIAGNOSIS — E291 Testicular hypofunction: Secondary | ICD-10-CM

## 2018-05-15 DIAGNOSIS — Z5181 Encounter for therapeutic drug level monitoring: Secondary | ICD-10-CM | POA: Diagnosis not present

## 2018-05-15 DIAGNOSIS — R413 Other amnesia: Secondary | ICD-10-CM

## 2018-05-15 MED ORDER — LACOSAMIDE 150 MG PO TABS: 1.0000 | ORAL_TABLET | Freq: Two times a day (BID) | ORAL | 5 refills | Status: DC

## 2018-05-15 MED ORDER — PHENYTOIN 50 MG PO CHEW: CHEWABLE_TABLET | ORAL | 3 refills | Status: DC

## 2018-05-15 MED ORDER — PHENYTOIN SODIUM EXTENDED 100 MG PO CAPS: 100.0000 mg | ORAL_CAPSULE | Freq: Every day | ORAL | 3 refills | Status: DC

## 2018-05-15 NOTE — Progress Notes (Signed)
PATIENT: [name] DOB: 04-24-1956  REASON FOR VISIT: follow up HISTORY FROM: patient  HISTORY OF PRESENT ILLNESS: Today 05/15/18: [name] is a 62 year old male with a history of seizures and memory disturbance.  He returns today for follow-up.  He feels that his memory has not gotten any worse.  He lives at home alone.  He is able to complete all ADLs independently.  He operates a Teacher, music without difficulty.  Reports that he is currently taking Dilantin 50 mg tablet as well as 100 mg tablet daily.  He is also on Vimpat 150 mg twice a day.  He denies any seizure events.  He returns today for evaluation.  HISTORY [name] is a 62 year old left-handed white male with a history of a schizoaffective disorder with depressive features, he is followed through psychiatry.  The patient is on Dilantin for his seizures, he does not report any recent seizure events.  The patient is operating a motor vehicle, he has been doing well with this, no motor vehicle accidents have been noted.  The patient is also on Vimpat.  He denies any issues with drowsiness or gait instability on these medications.  There has been a change in memory that began 2 years ago after a series of prolonged seizures.  Since that time, the patient has had episodes of altered mental status that may occur on average about 5 days a month.  The patient will have events where he will be confused, he may be drunk and staggery.  The patient then will get over the event and act normally for the rest of the month.  Some events may be associated with confusion without being drunk and or ataxic.  The patient has poor recollection for events that occur during these episodes.  The patient has chronic insomnia, he will take extra medication frequently for the this issue.  He may take 2-3 clonazepam at a time.  The patient comes to this office for an evaluation.    REVIEW OF SYSTEMS: Out of a complete 14 system review of symptoms, the  patient complains only of the following symptoms, and all other reviewed systems are negative.  Unexpected weight change, constipation, insomnia, aching, urgency, numbness, seizure, depression, heat intolerance  ALLERGIES: Allergies  Allergen Reactions  . Codeine Nausea And Vomiting  . Sulfa Antibiotics Nausea And Vomiting  . Sulfasalazine Nausea And Vomiting    HOME MEDICATIONS: Outpatient Medications Prior to Visit  Medication Sig Dispense Refill  . amitriptyline (ELAVIL) 100 MG tablet Take 2 tablets (200 mg total) by mouth at bedtime. 60 tablet 2  . amLODipine (NORVASC) 5 MG tablet Take 1 tablet (5 mg total) by mouth daily. 90 tablet 3  . aspirin EC 81 MG tablet Take 1 tablet (81 mg total) by mouth daily.    Marland Kitchen atorvastatin (LIPITOR) 40 MG tablet Take 1 tablet (40 mg total) by mouth daily. 90 tablet 3  . clonazePAM (KLONOPIN) 1 MG tablet Take 1 tablet (1 mg total) by mouth 2 (two) times daily. 60 tablet 2  . [facility] SENNA PLUS 8.6-50 MG tablet TAKE 1 TABLET BY MOUTH AT BEDTIME AS NEEDED FOR MILD CONSTIPATION 30 tablet 3  . hydrochlorothiazide (HYDRODIURIL) 25 MG tablet Take 1 tablet (25 mg total) by mouth daily. 90 tablet 3  . losartan (COZAAR) 50 MG tablet TAKE 1 TABLET BY MOUTH EVERY DAY 30 tablet 6  . metoprolol succinate (TOPROL-XL) 100 MG 24 hr tablet TAKE 2 TABLETS (200 MG TOTAL) DAILY  BY MOUTH. 180 tablet 1  . Multiple Vitamin (MULTIVITAMIN WITH MINERALS) TABS tablet Take 1 tablet by mouth daily.    . ondansetron (ZOFRAN) 4 MG tablet Take 1 tablet (4 mg total) by mouth every 8 (eight) hours as needed for nausea or vomiting. 30 tablet 0  . phenytoin (DILANTIN) 100 MG ER capsule TAKE 2 CAPSULES BY MOUTH AT NIGHT 180 capsule 1  . PHENYTOIN INFATABS 50 MG tablet CHEW 1 TABLET BY MOUTH AT BEDTIME 90 tablet 3  . polyethylene glycol powder (GLYCOLAX/MIRALAX) powder TAKE 17 GRAMS 2 TIMES A DAY AS NEEDED FOR CONSTIPATION. MIX INTO 8 OF FLUID AND DRINK 500 g 1  . QUEtiapine (SEROQUEL) 300 MG  tablet Take 2 tablets (600 mg total) by mouth at bedtime. 60 tablet 2  . testosterone cypionate (DEPOTESTOSTERONE CYPIONATE) 200 MG/ML injection Inject 0.75 mLs (150 mg total) into the muscle every 14 (fourteen) days.    Marland Kitchen VIMPAT 150 MG TABS TAKE 1 TABLET BY MOUTH TWO TIMES DAILY 60 tablet 5   No facility-administered medications prior to visit.     PAST MEDICAL HISTORY: Past Medical History:  Diagnosis Date  . Anxiety   . Constipation   . Depression   . Dyslipidemia   . GERD (gastroesophageal reflux disease)   . Hearing loss   . History of echocardiogram    Echo 8/18: EF 50-55, normal wall motion, grade 1 diastolic dysfunction, mild MR  . Hypertension   . Memory difficulty 10/25/2017  . Memory loss   . MRSA carrier   . Schizoaffective disorder   . Seizures ([place])     PAST SURGICAL HISTORY: Past Surgical History:  Procedure Laterality Date  . CARPAL TUNNEL RELEASE    . COLONOSCOPY  2010  . ORIF SHOULDER FRACTURE    . self orchectomy    . SHOULDER CLOSED REDUCTION Left 09/16/2013   Procedure: CLOSED REDUCTION SHOULDER;  Surgeon: Mauri Pole, MD;  Location: [facility];  Service: Orthopedics;  Laterality: Left;  . SHOULDER HEMI-ARTHROPLASTY Left 09/18/2013   Procedure: LEFT SHOULDER HEMI-ARTHROPLASTY;  Surgeon: Augustin Schooling, MD;  Location: [facility];  Service: Orthopedics;  Laterality: Left;    FAMILY HISTORY: Family History  Problem Relation Age of Onset  . Stroke Father        Living at 77  . Stroke Mother        Stroke in late 25's. Died in her 7's  . Alcohol abuse Brother   . Alcohol abuse Brother   . Diabetes Brother     SOCIAL HISTORY: Social History   Socioeconomic History  . Marital status: Single    Spouse name: Not on file  . Number of children: 0  . Years of education: 1  . Highest education level: Not on file  Occupational History  . Occupation: disabled  Social Needs  . Financial resource strain: Not on file  . Food insecurity:    Worry: Not on  file    Inability: Not on file  . Transportation needs:    Medical: Not on file    Non-medical: Not on file  Tobacco Use  . Smoking status: Never Smoker  . Smokeless tobacco: Never Used  Substance and Sexual Activity  . Alcohol use: No    Alcohol/week: 0.0 oz  . Drug use: No  . Sexual activity: Not Currently    Partners: Female    Birth control/protection: None  Lifestyle  . Physical activity:    Days per week: Not on file  Minutes per session: Not on file  . Stress: Not on file  Relationships  . Social connections:    Talks on phone: Not on file    Gets together: Not on file    Attends religious service: Not on file    Active member of club or organization: Not on file    Attends meetings of clubs or organizations: Not on file    Relationship status: Not on file  . Intimate partner violence:    Fear of current or ex partner: Not on file    Emotionally abused: Not on file    Physically abused: Not on file    Forced sexual activity: Not on file  Other Topics Concern  . Not on file  Social History Narrative   Patient does not drink caffeine.   Patient is left handed.      PHYSICAL EXAM  Vitals:   05/15/18 0801  BP: 103/67  Pulse: 66  Weight: 186 lb (84.4 kg)  Height: 5\' 11"  (1.803 m)   Body mass index is 25.94 kg/m.   MMSE - Mini Mental State Exam 05/15/2018 10/25/2017  Orientation to time 5 4  Orientation to Place 4 5  Registration 3 3  Attention/ Calculation 3 2  Recall 3 2  Language- name 2 objects 2 2  Language- repeat 1 1  Language- follow 3 step command 3 3  Language- read & follow direction 1 1  Write a sentence 1 1  Copy design 1 1  Total score 27 25     Generalized: Well developed, in no acute distress   Neurological examination  Mentation: Alert oriented to time, place, history taking. Follows all commands speech and language fluent Cranial nerve II-XII: Pupils were equal round reactive to light. Extraocular movements were full, visual  field were full on confrontational test. Facial sensation and strength were normal. Uvula tongue midline. Head turning and shoulder shrug  were normal and symmetric. Motor: The motor testing reveals 5 over 5 strength of all 4 extremities. Good symmetric motor tone is noted throughout.  Sensory: Sensory testing is intact to soft touch on all 4 extremities. No evidence of extinction is noted.  Coordination: Cerebellar testing reveals good finger-nose-finger and heel-to-shin bilaterally.  Gait and station: Gait is normal.  Reflexes: Deep tendon reflexes are symmetric and normal bilaterally.   DIAGNOSTIC DATA (LABS, IMAGING, TESTING) - I reviewed patient records, labs, notes, testing and imaging myself where available.  Lab Results  Component Value Date   WBC 7.4 04/01/2018   HGB 17.8 (H) 04/01/2018   HCT 50.6 04/01/2018   MCV 94 04/01/2018   PLT 208 04/01/2018      Component Value Date/Time   NA 140 04/01/2018 1154   K 4.7 04/01/2018 1154   CL 100 04/01/2018 1154   CO2 16 (L) 04/01/2018 1154   GLUCOSE 112 (H) 04/01/2018 1154   GLUCOSE 100 (H) 09/29/2017 1433   BUN 20 04/01/2018 1154   CREATININE 0.91 04/01/2018 1154   CREATININE 0.87 02/18/2017 1413   CALCIUM 9.6 04/01/2018 1154   PROT 7.6 04/01/2018 1154   ALBUMIN 4.8 04/01/2018 1154   AST 22 04/01/2018 1154   ALT 17 04/01/2018 1154   ALKPHOS 109 04/01/2018 1154   BILITOT 0.4 04/01/2018 1154   GFRNONAA 89 04/01/2018 1154   GFRNONAA >89 02/18/2017 1413   GFRAA 103 04/01/2018 1154   GFRAA >89 02/18/2017 1413   Lab Results  Component Value Date   CHOL 141 04/01/2018   HDL  55 04/01/2018   LDLCALC 63 04/01/2018   TRIG 116 04/01/2018   CHOLHDL 2.6 04/01/2018   Lab Results  Component Value Date   HGBA1C 6.0 04/01/2018   Lab Results  Component Value Date   [id number] 672 10/25/2017   Lab Results  Component Value Date   TSH 2.100 04/01/2018      ASSESSMENT AND PLAN 62 y.o. year old male  has a past medical  history of Anxiety, Constipation, Depression, Dyslipidemia, GERD (gastroesophageal reflux disease), Hearing loss, History of echocardiogram, Hypertension, Memory difficulty (10/25/2017), Memory loss, MRSA carrier, Schizoaffective disorder, and Seizures ([place]). here with :  1.  Seizures 2.  Memory disturbance  The patient's memory score has remained stable.  We will continue to monitor his memory over time.  He will remain on Dilantin and Vimpat.  I will check blood work today.  He is advised that if he has any seizure events he should let us know.  He will follow-up in 1 year or sooner if needed.   Ward Givens, MSN, NP-C 05/15/2018, 8:27 AM [facility] [address] [place], [place] [address] [phone]

## 2018-05-15 NOTE — Progress Notes (Signed)
I have read the note, and I agree with the clinical assessment and plan.  Katelee Schupp K Tolulope Pinkett   

## 2018-05-15 NOTE — Progress Notes (Signed)
Fax confirmation received vimpat [facility] [phone].

## 2018-05-15 NOTE — Patient Instructions (Signed)
Your Plan:  Continue Dilantin and vimpat Memory score has improved. We will continue to monitor  Thank you for coming to see [place] at [facility]. I hope we have been able to provide you high quality care today.  You may receive a patient satisfaction survey over the next few weeks. We would appreciate your feedback and comments so that we may continue to improve ourselves and the health of our patients.

## 2018-05-16 ENCOUNTER — Telehealth: Payer: Self-pay | Admitting: *Deleted

## 2018-05-16 LAB — CBC WITH DIFFERENTIAL/PLATELET
Basophils Absolute: 0.1 10*3/uL (ref 0.0–0.2)
Basos: 1 %
EOS (ABSOLUTE): 0.2 10*3/uL (ref 0.0–0.4)
Eos: 4 %
Hematocrit: 43.5 % (ref 37.5–51.0)
Hemoglobin: 14.7 g/dL (ref 13.0–17.7)
Immature Grans (Abs): 0 10*3/uL (ref 0.0–0.1)
Immature Granulocytes: 0 %
Lymphocytes Absolute: 1.9 10*3/uL (ref 0.7–3.1)
Lymphs: 31 %
MCH: 32.8 pg (ref 26.6–33.0)
MCHC: 33.8 g/dL (ref 31.5–35.7)
MCV: 97 fL (ref 79–97)
Monocytes Absolute: 0.5 10*3/uL (ref 0.1–0.9)
Monocytes: 8 %
Neutrophils Absolute: 3.5 10*3/uL (ref 1.4–7.0)
Neutrophils: 56 %
Platelets: 206 10*3/uL (ref 150–450)
RBC: 4.48 x10E6/uL (ref 4.14–5.80)
RDW: 16 % — ABNORMAL HIGH (ref 12.3–15.4)
WBC: 6.2 10*3/uL (ref 3.4–10.8)

## 2018-05-16 LAB — COMPREHENSIVE METABOLIC PANEL
ALT: 22 IU/L (ref 0–44)
AST: 15 IU/L (ref 0–40)
Albumin/Globulin Ratio: 1.9 (ref 1.2–2.2)
Albumin: 4.2 g/dL (ref 3.6–4.8)
Alkaline Phosphatase: 94 IU/L (ref 39–117)
BUN/Creatinine Ratio: 19 (ref 10–24)
BUN: 21 mg/dL (ref 8–27)
Bilirubin Total: <0.2 mg/dL (ref 0.0–1.2)
CO2: 23 mmol/L (ref 20–29)
Calcium: 9.1 mg/dL (ref 8.6–10.2)
Chloride: 104 mmol/L (ref 96–106)
Creatinine, Ser: 1.11 mg/dL (ref 0.76–1.27)
GFR calc Af Amer: 81 mL/min/{1.73_m2} (ref >59)
GFR calc non Af Amer: 70 mL/min/{1.73_m2} (ref >59)
Globulin, Total: 2.2 g/dL (ref 1.5–4.5)
Glucose: 92 mg/dL (ref 65–99)
Potassium: 4.7 mmol/L (ref 3.5–5.2)
Sodium: 141 mmol/L (ref 134–144)
Total Protein: 6.4 g/dL (ref 6.0–8.5)

## 2018-05-16 LAB — PHENYTOIN LEVEL, TOTAL: Phenytoin (Dilantin), Serum: 12.8 ug/mL (ref 10.0–20.0)

## 2018-05-16 NOTE — Telephone Encounter (Signed)
LMVM for pt that lab results were relativiely unremarkable per MM/NP.  He is call back if questions next week, as office closed at this time.

## 2018-05-16 NOTE — Telephone Encounter (Signed)
Called patient again and was able to reach him.  Reviewed labs and CT scan - all were unremarkable. He has GI appointment in 2 weeks. Reports he is feeling better. No longer itching and nausea is resolved. Weight has stabilized.  Encouraged him to keep appointment with GI and then schedule follow up with me after that. Patient appreciative.  Leeanne Rio, MD

## 2018-05-16 NOTE — Telephone Encounter (Signed)
-----   Message from Ward Givens, NP sent at 05/16/2018  8:54 AM EDT ----- Blood work relatively unremarkable.  Please call patient

## 2018-05-27 ENCOUNTER — Ambulatory Visit: Payer: Medicaid Other | Admitting: Gastroenterology

## 2018-05-27 ENCOUNTER — Encounter: Payer: Self-pay | Admitting: Gastroenterology

## 2018-05-27 VITALS — BP 104/62 | HR 68 | Ht 71.0 in | Wt 189.1 lb

## 2018-05-27 DIAGNOSIS — R634 Abnormal weight loss: Secondary | ICD-10-CM

## 2018-05-27 MED ORDER — PEG 3350-KCL-NA BICARB-NACL 420 G PO SOLR: 4000.0000 mL | ORAL | 0 refills | Status: DC

## 2018-05-27 NOTE — Patient Instructions (Signed)
You have been scheduled for a colonoscopy/ EGD. Please follow written instructions given to you at your visit today.  Please pick up your prep supplies at the pharmacy within the next 1-3 days. If you use inhalers (even only as needed), please bring them with you on the day of your procedure. Your physician has requested that you go to www.[email or web address].[email or web address] and enter the access code given to you at your visit today. This web site gives a general overview about your procedure. However, you should still follow specific instructions given to you by our office regarding your preparation for the procedure.  Normal BMI (Body Mass Index- based on height and weight) is between 19 and 25. Your BMI today is Body mass index is 26.38 kg/m. Marland Kitchen Please consider follow up  regarding your BMI with your Primary Care Provider.

## 2018-05-27 NOTE — Progress Notes (Signed)
HPI: This is a very pleasant 62 year old man who was referred to me by Leeanne Rio, MD  to evaluate weight loss, anorexia.    Chief complaint is weight loss, anorexia  Dilantin No. 1 side effect is nausea, #2 side effect is vomiting Vimpat No. 4 and saw 5 side effects are nausea and vomiting  Feels he's lost 25 puonds in the past 4-5 months.  No diarrhea, actually tends to be constiapted.  He really has no abdominal pains.  He does describe early satiety.  He also tells me that his microwave of them broke about 3 months ago and he has not gotten it fixed.  He tells me that normally his microwave is used for 70 to 80% of the food he eats  He has never had trouble like this before with his weight  Colonoscopy 8-9 years ago in [place], polyps were removed he believes.  He is not sure where this was done, exactly when it was done, who did it for more exactly what was found.  He is pretty sure it was somewhere in [place] however   Old Data Reviewed: CT scan abdomen pelvis with IV contrast May 2019 was essentially normal  Blood work April, May 2019; CBC, complete metabolic profile, hemoglobin A1c, HIV were all normal    Review of systems: Pertinent positive and negative review of systems were noted in the above HPI section. All other review negative.   Past Medical History:  Diagnosis Date  . Anxiety   . Constipation   . Depression   . Dyslipidemia   . GERD (gastroesophageal reflux disease)   . Hearing loss   . History of echocardiogram    Echo 8/18: EF 50-55, normal wall motion, grade 1 diastolic dysfunction, mild MR  . Hypertension   . Memory difficulty 10/25/2017  . Memory loss   . MRSA carrier   . Schizoaffective disorder   . Seizures ([place])     Past Surgical History:  Procedure Laterality Date  . CARPAL TUNNEL RELEASE    . COLONOSCOPY  2010  . ORIF SHOULDER FRACTURE    . self orchectomy    . SHOULDER CLOSED REDUCTION Left 09/16/2013   Procedure: CLOSED  REDUCTION SHOULDER;  Surgeon: Mauri Pole, MD;  Location: [facility];  Service: Orthopedics;  Laterality: Left;  . SHOULDER HEMI-ARTHROPLASTY Left 09/18/2013   Procedure: LEFT SHOULDER HEMI-ARTHROPLASTY;  Surgeon: Augustin Schooling, MD;  Location: [facility];  Service: Orthopedics;  Laterality: Left;    Current Outpatient Medications  Medication Sig Dispense Refill  . amitriptyline (ELAVIL) 100 MG tablet Take 2 tablets (200 mg total) by mouth at bedtime. 60 tablet 2  . amLODipine (NORVASC) 5 MG tablet Take 1 tablet (5 mg total) by mouth daily. 90 tablet 3  . aspirin EC 81 MG tablet Take 1 tablet (81 mg total) by mouth daily.    Marland Kitchen atorvastatin (LIPITOR) 40 MG tablet Take 1 tablet (40 mg total) by mouth daily. 90 tablet 3  . clonazePAM (KLONOPIN) 1 MG tablet Take 1 tablet (1 mg total) by mouth 2 (two) times daily. 60 tablet 2  . hydrochlorothiazide (HYDRODIURIL) 25 MG tablet Take 1 tablet (25 mg total) by mouth daily. 90 tablet 3  . Lacosamide (VIMPAT) 150 MG TABS Take 1 tablet (150 mg total) by mouth 2 (two) times daily. 60 tablet 5  . losartan (COZAAR) 50 MG tablet TAKE 1 TABLET BY MOUTH EVERY DAY 30 tablet 6  . metoprolol succinate (TOPROL-XL) 100 MG 24  hr tablet TAKE 2 TABLETS (200 MG TOTAL) DAILY BY MOUTH. 180 tablet 1  . Multiple Vitamin (MULTIVITAMIN WITH MINERALS) TABS tablet Take 1 tablet by mouth daily.    . ondansetron (ZOFRAN) 4 MG tablet Take 1 tablet (4 mg total) by mouth every 8 (eight) hours as needed for nausea or vomiting. 30 tablet 0  . phenytoin (DILANTIN) 100 MG ER capsule Take 1 capsule (100 mg total) by mouth at bedtime. 90 capsule 3  . phenytoin (PHENYTOIN INFATABS) 50 MG tablet CHEW 1 TABLET BY MOUTH AT BEDTIME 90 tablet 3  . polyethylene glycol powder (GLYCOLAX/MIRALAX) powder TAKE 17 GRAMS 2 TIMES A DAY AS NEEDED FOR CONSTIPATION. MIX INTO 8 OF FLUID AND DRINK 500 g 1  . QUEtiapine (SEROQUEL) 300 MG tablet Take 2 tablets (600 mg total) by mouth at bedtime. 60 tablet 2  .  testosterone cypionate (DEPOTESTOSTERONE CYPIONATE) 200 MG/ML injection Inject 0.75 mLs (150 mg total) into the muscle every 14 (fourteen) days.     No current facility-administered medications for this visit.     Allergies as of 05/27/2018 - Review Complete 05/27/2018  Allergen Reaction Noted  . Codeine Nausea And Vomiting 01/20/2014  . Sulfa antibiotics Nausea And Vomiting 01/08/2012  . Sulfasalazine Nausea And Vomiting 01/08/2012    Family History  Problem Relation Age of Onset  . Stroke Father        Living at 72  . Stroke Mother        Stroke in late 95's. Died in her 64's  . Alcohol abuse Brother   . Alcohol abuse Brother   . Diabetes Brother     Social History   Socioeconomic History  . Marital status: Single    Spouse name: Not on file  . Number of children: 0  . Years of education: 24  . Highest education level: Not on file  Occupational History  . Occupation: disabled  Social Needs  . Financial resource strain: Not on file  . Food insecurity:    Worry: Not on file    Inability: Not on file  . Transportation needs:    Medical: Not on file    Non-medical: Not on file  Tobacco Use  . Smoking status: Never Smoker  . Smokeless tobacco: Never Used  Substance and Sexual Activity  . Alcohol use: No    Alcohol/week: 0.0 oz  . Drug use: No  . Sexual activity: Not Currently    Partners: Female    Birth control/protection: None  Lifestyle  . Physical activity:    Days per week: Not on file    Minutes per session: Not on file  . Stress: Not on file  Relationships  . Social connections:    Talks on phone: Not on file    Gets together: Not on file    Attends religious service: Not on file    Active member of club or organization: Not on file    Attends meetings of clubs or organizations: Not on file    Relationship status: Not on file  . Intimate partner violence:    Fear of current or ex partner: Not on file    Emotionally abused: Not on file     Physically abused: Not on file    Forced sexual activity: Not on file  Other Topics Concern  . Not on file  Social History Narrative   Patient does not drink caffeine.   Patient is left handed.     Physical Exam: BP 104/62  Pulse 68   Ht 5\' 11"  (1.803 m)   Wt 189 lb 2 oz (85.8 kg)   BMI 26.38 kg/m  Constitutional: generally well-appearing Psychiatric: alert and oriented x3 Eyes: extraocular movements intact Mouth: oral pharynx moist, no lesions Neck: supple no lymphadenopathy Cardiovascular: heart regular rate and rhythm Lungs: clear to auscultation bilaterally Abdomen: soft, nontender, nondistended, no obvious ascites, no peritoneal signs, normal bowel sounds Extremities: no lower extremity edema bilaterally Skin: no lesions on visible extremities   Assessment and plan: 62 y.o. male with early satiety, weight loss  Reviewing behavioral therapy notes it does appear that he has lost at least 15 to 20 pounds in the past year or so.  He has some early satiety and anorexia but otherwise no GI symptoms.  No significant abdominal pains, no bowel changes.  CT scan and basic blood work testing have all been normal.  I recommended we get him up-to-date on colon cancer screening with a colonoscopy and also proceed with upper endoscopy at his soonest convenience for the early satiety, anorexia.   Please see the "Patient Instructions" section for addition details about the plan.   Owens Loffler, MD [facility] 05/27/2018, 3:01 PM  Cc: Leeanne Rio, MD

## 2018-05-28 ENCOUNTER — Encounter (HOSPITAL_COMMUNITY): Payer: Self-pay | Admitting: Psychiatry

## 2018-05-28 ENCOUNTER — Ambulatory Visit (INDEPENDENT_AMBULATORY_CARE_PROVIDER_SITE_OTHER): Payer: Medicaid Other | Admitting: Psychiatry

## 2018-05-28 DIAGNOSIS — R634 Abnormal weight loss: Secondary | ICD-10-CM | POA: Diagnosis not present

## 2018-05-28 DIAGNOSIS — F22 Delusional disorders: Secondary | ICD-10-CM | POA: Diagnosis not present

## 2018-05-28 DIAGNOSIS — Z811 Family history of alcohol abuse and dependence: Secondary | ICD-10-CM

## 2018-05-28 DIAGNOSIS — G40909 Epilepsy, unspecified, not intractable, without status epilepticus: Secondary | ICD-10-CM

## 2018-05-28 DIAGNOSIS — F251 Schizoaffective disorder, depressive type: Secondary | ICD-10-CM | POA: Diagnosis not present

## 2018-05-28 DIAGNOSIS — R450 Nervousness: Secondary | ICD-10-CM | POA: Diagnosis not present

## 2018-05-28 DIAGNOSIS — F419 Anxiety disorder, unspecified: Secondary | ICD-10-CM

## 2018-05-28 DIAGNOSIS — Z6826 Body mass index (BMI) 26.0-26.9, adult: Secondary | ICD-10-CM | POA: Diagnosis not present

## 2018-05-28 MED ORDER — CLONAZEPAM 1 MG PO TABS: 1.0000 mg | ORAL_TABLET | Freq: Two times a day (BID) | ORAL | 2 refills | Status: DC

## 2018-05-28 MED ORDER — QUETIAPINE FUMARATE 300 MG PO TABS: 600.0000 mg | ORAL_TABLET | Freq: Every day | ORAL | 2 refills | Status: DC

## 2018-05-28 MED ORDER — AMITRIPTYLINE HCL 100 MG PO TABS: 200.0000 mg | ORAL_TABLET | Freq: Every day | ORAL | 2 refills | Status: DC

## 2018-05-28 NOTE — Progress Notes (Signed)
BH MD/PA/NP OP Progress Note  05/28/2018 1:56 PM [name]  MRN:  [id number]  Chief Complaint: I do not have any more dizziness.  But I am losing weight.  HPI: [name] came for his follow-up appointment.  He is taking his medication and denies any side effects.  He is pleased that he has no longer dizziness.  Recently he seen neurology.  He has seizure disorder and he has no seizure in 3-1/2 years.  He still has chronic anxiety and nervousness but he denies any crying spells, feeling of hopelessness or worthlessness.  He has chronic paranoia and sometimes he does not leave the house because he feels very scared with the strangers.  Recently he is losing weight and he admitted that his microwave is broken and he have not eaten the food regularly.  He recently seen GI for the same issue.  He denies drinking or using any illegal substances.  He has no tremors, shakes or any EPS.  He is compliant with Klonopin, amitriptyline and Seroquel.  He lost 20 pounds in past 3 months.  He is seeing [name] for therapy.  Visit Diagnosis:    ICD-10-CM   1. Schizoaffective disorder, depressive type with good prognostic features (HCC) F25.1 QUEtiapine (SEROQUEL) 300 MG tablet    clonazePAM (KLONOPIN) 1 MG tablet    amitriptyline (ELAVIL) 100 MG tablet    Past Psychiatric History: Reviewed. Patient has depression since his college. He has at least 15 hospitalization due to severe depression, paranoia and suicidal thoughts. His last hospitalization was in August 2016. At that time he was thinking to kill himself by inhaling helium. He was discharged on Seroquel 900 mg and amitriptyline 400 mg at bedtime.He had history of suicidal attempt when he cut his testicle because he does not want to be depressed. He was seeing [name] at [facility] and then moved to [name]. Patient has multiple trial of SSRIs however he had reaction to SSRIs. In the past he had tried nortriptyline, Haldol,  Geodon, Seroquel, lithium, Prozac, Wellbutrin, Paxil, Ativan, Lexapro and Zoloft. Patient denies any history of mania or any drug use.  Past Medical History:  Past Medical History:  Diagnosis Date  . Anxiety   . Constipation   . Depression   . Dyslipidemia   . GERD (gastroesophageal reflux disease)   . Hearing loss   . History of echocardiogram    Echo 8/18: EF 50-55, normal wall motion, grade 1 diastolic dysfunction, mild MR  . Hypertension   . Memory difficulty 10/25/2017  . Memory loss   . MRSA carrier   . Schizoaffective disorder   . Seizures ([place])     Past Surgical History:  Procedure Laterality Date  . CARPAL TUNNEL RELEASE    . COLONOSCOPY  2010  . ORIF SHOULDER FRACTURE    . self orchectomy    . SHOULDER CLOSED REDUCTION Left 09/16/2013   Procedure: CLOSED REDUCTION SHOULDER;  Surgeon: Mauri Pole, MD;  Location: [facility];  Service: Orthopedics;  Laterality: Left;  . SHOULDER HEMI-ARTHROPLASTY Left 09/18/2013   Procedure: LEFT SHOULDER HEMI-ARTHROPLASTY;  Surgeon: Augustin Schooling, MD;  Location: [facility];  Service: Orthopedics;  Laterality: Left;    Family Psychiatric History: Reviewed.    Family History:  Family History  Problem Relation Age of Onset  . Stroke Father        Living at 37  . Stroke Mother        Stroke in late 75's. Died  in her 16's  . Alcohol abuse Brother   . Alcohol abuse Brother   . Diabetes Brother     Social History:  Social History   Socioeconomic History  . Marital status: Single    Spouse name: Not on file  . Number of children: 0  . Years of education: 17  . Highest education level: Not on file  Occupational History  . Occupation: disabled  Social Needs  . Financial resource strain: Not on file  . Food insecurity:    Worry: Not on file    Inability: Not on file  . Transportation needs:    Medical: Not on file    Non-medical: Not on file  Tobacco Use  . Smoking status: Never Smoker  . Smokeless tobacco: Never Used   Substance and Sexual Activity  . Alcohol use: No    Alcohol/week: 0.0 oz  . Drug use: No  . Sexual activity: Not Currently    Partners: Female    Birth control/protection: None  Lifestyle  . Physical activity:    Days per week: Not on file    Minutes per session: Not on file  . Stress: Not on file  Relationships  . Social connections:    Talks on phone: Not on file    Gets together: Not on file    Attends religious service: Not on file    Active member of club or organization: Not on file    Attends meetings of clubs or organizations: Not on file    Relationship status: Not on file  Other Topics Concern  . Not on file  Social History Narrative   Patient does not drink caffeine.   Patient is left handed.    Allergies:  Allergies  Allergen Reactions  . Codeine Nausea And Vomiting  . Sulfa Antibiotics Nausea And Vomiting  . Sulfasalazine Nausea And Vomiting    Metabolic Disorder Labs: Recent Results (from the past 2160 hour(s))  CMP14+EGFR     Status: Abnormal   Collection Time: 04/01/18 11:54 AM  Result Value Ref Range   Glucose 112 (H) 65 - 99 mg/dL   BUN 20 8 - 27 mg/dL   Creatinine, Ser 0.91 0.76 - 1.27 mg/dL   GFR calc non Af Amer 89 >59 mL/min/1.73   GFR calc Af Amer 103 >59 mL/min/1.73   BUN/Creatinine Ratio 22 10 - 24   Sodium 140 134 - 144 mmol/L   Potassium 4.7 3.5 - 5.2 mmol/L   Chloride 100 96 - 106 mmol/L   CO2 16 (L) 20 - 29 mmol/L   Calcium 9.6 8.6 - 10.2 mg/dL   Total Protein 7.6 6.0 - 8.5 g/dL   Albumin 4.8 3.6 - 4.8 g/dL   Globulin, Total 2.8 1.5 - 4.5 g/dL   Albumin/Globulin Ratio 1.7 1.2 - 2.2   Bilirubin Total 0.4 0.0 - 1.2 mg/dL   Alkaline Phosphatase 109 39 - 117 IU/L   AST 22 0 - 40 IU/L   ALT 17 0 - 44 IU/L  CBC with Differential/Platelet     Status: Abnormal   Collection Time: 04/01/18 11:54 AM  Result Value Ref Range   WBC 7.4 3.4 - 10.8 x10E3/uL   RBC 5.37 4.14 - 5.80 x10E6/uL   Hemoglobin 17.8 (H) 13.0 - 17.7 g/dL    Hematocrit 50.6 37.5 - 51.0 %   MCV 94 79 - 97 fL   MCH 33.1 (H) 26.6 - 33.0 pg   MCHC 35.2 31.5 - 35.7 g/dL   RDW  14.8 12.3 - 15.4 %   Platelets 208 150 - 379 x10E3/uL   Neutrophils 70 Not Estab. %   Lymphs 25 Not Estab. %   Monocytes 4 Not Estab. %   Eos 1 Not Estab. %   Basos 0 Not Estab. %   Neutrophils Absolute 5.2 1.4 - 7.0 x10E3/uL   Lymphocytes Absolute 1.8 0.7 - 3.1 x10E3/uL   Monocytes Absolute 0.3 0.1 - 0.9 x10E3/uL   EOS (ABSOLUTE) 0.1 0.0 - 0.4 x10E3/uL   Basophils Absolute 0.0 0.0 - 0.2 x10E3/uL   Immature Granulocytes 0 Not Estab. %   Immature Grans (Abs) 0.0 0.0 - 0.1 x10E3/uL  Lipid panel     Status: None   Collection Time: 04/01/18 11:54 AM  Result Value Ref Range   Cholesterol, Total 141 100 - 199 mg/dL   Triglycerides 116 0 - 149 mg/dL   HDL 55 >39 mg/dL   VLDL Cholesterol Cal 23 5 - 40 mg/dL   LDL Calculated 63 0 - 99 mg/dL   Chol/HDL Ratio 2.6 0.0 - 5.0 ratio    Comment:                                   T. Chol/HDL Ratio                                             Men  Women                               1/2 Avg.Risk  3.4    3.3                                   Avg.Risk  5.0    4.4                                2X Avg.Risk  9.6    7.1                                3X Avg.Risk 23.4   11.0   TSH     Status: None   Collection Time: 04/01/18 11:54 AM  Result Value Ref Range   TSH 2.100 0.450 - 4.500 uIU/mL  HIV antibody     Status: None   Collection Time: 04/01/18 11:54 AM  Result Value Ref Range   HIV Screen 4th Generation wRfx Non Reactive Non Reactive  POCT glycosylated hemoglobin (Hb A1C)     Status: None   Collection Time: 04/01/18 12:15 PM  Result Value Ref Range   Hemoglobin A1C 6.0   Comprehensive metabolic panel     Status: None   Collection Time: 05/15/18  9:05 AM  Result Value Ref Range   Glucose 92 65 - 99 mg/dL   BUN 21 8 - 27 mg/dL   Creatinine, Ser 1.11 0.76 - 1.27 mg/dL   GFR calc non Af Amer 70 >59 mL/min/1.73   GFR calc Af  Amer 81 >59 mL/min/1.73   BUN/Creatinine Ratio 19 10 - 24   Sodium 141 134 - 144  mmol/L   Potassium 4.7 3.5 - 5.2 mmol/L   Chloride 104 96 - 106 mmol/L   CO2 23 20 - 29 mmol/L   Calcium 9.1 8.6 - 10.2 mg/dL   Total Protein 6.4 6.0 - 8.5 g/dL   Albumin 4.2 3.6 - 4.8 g/dL   Globulin, Total 2.2 1.5 - 4.5 g/dL   Albumin/Globulin Ratio 1.9 1.2 - 2.2   Bilirubin Total <0.2 0.0 - 1.2 mg/dL   Alkaline Phosphatase 94 39 - 117 IU/L   AST 15 0 - 40 IU/L   ALT 22 0 - 44 IU/L  CBC with Differential/Platelet     Status: Abnormal   Collection Time: 05/15/18  9:05 AM  Result Value Ref Range   WBC 6.2 3.4 - 10.8 x10E3/uL   RBC 4.48 4.14 - 5.80 x10E6/uL   Hemoglobin 14.7 13.0 - 17.7 g/dL   Hematocrit 43.5 37.5 - 51.0 %   MCV 97 79 - 97 fL   MCH 32.8 26.6 - 33.0 pg   MCHC 33.8 31.5 - 35.7 g/dL   RDW 16.0 (H) 12.3 - 15.4 %   Platelets 206 150 - 450 x10E3/uL    Comment:               **Please note reference interval change**   Neutrophils 56 Not Estab. %   Lymphs 31 Not Estab. %   Monocytes 8 Not Estab. %   Eos 4 Not Estab. %   Basos 1 Not Estab. %   Neutrophils Absolute 3.5 1.4 - 7.0 x10E3/uL   Lymphocytes Absolute 1.9 0.7 - 3.1 x10E3/uL   Monocytes Absolute 0.5 0.1 - 0.9 x10E3/uL   EOS (ABSOLUTE) 0.2 0.0 - 0.4 x10E3/uL   Basophils Absolute 0.1 0.0 - 0.2 x10E3/uL   Immature Granulocytes 0 Not Estab. %   Immature Grans (Abs) 0.0 0.0 - 0.1 x10E3/uL  Phenytoin level, total     Status: None   Collection Time: 05/15/18  9:05 AM  Result Value Ref Range   Phenytoin (Dilantin), Serum 12.8 10.0 - 20.0 ug/mL    Comment:                                 Detection Limit =  0.8                           <0.8 Indicates None Detected    Lab Results  Component Value Date   HGBA1C 6.0 04/01/2018   MPG 111 07/30/2015   MPG 123 07/13/2015   Lab Results  Component Value Date   PROLACTIN 12.0 07/13/2015   Lab Results  Component Value Date   CHOL 141 04/01/2018   TRIG 116 04/01/2018   HDL 55  04/01/2018   CHOLHDL 2.6 04/01/2018   VLDL 28 02/18/2017   LDLCALC 63 04/01/2018   LDLCALC 80 02/18/2017   Lab Results  Component Value Date   TSH 2.100 04/01/2018   TSH 2.644 07/13/2015    Therapeutic Level Labs: No results found for: LITHIUM No results found for: VALPROATE No components found for:  CBMZ  Current Medications: Current Outpatient Medications  Medication Sig Dispense Refill  . amitriptyline (ELAVIL) 100 MG tablet Take 2 tablets (200 mg total) by mouth at bedtime. 60 tablet 2  . amLODipine (NORVASC) 5 MG tablet Take 1 tablet (5 mg total) by mouth daily. 90 tablet 3  . aspirin EC 81 MG tablet  Take 1 tablet (81 mg total) by mouth daily.    Marland Kitchen atorvastatin (LIPITOR) 40 MG tablet Take 1 tablet (40 mg total) by mouth daily. 90 tablet 3  . clonazePAM (KLONOPIN) 1 MG tablet Take 1 tablet (1 mg total) by mouth 2 (two) times daily. 60 tablet 2  . hydrochlorothiazide (HYDRODIURIL) 25 MG tablet Take 1 tablet (25 mg total) by mouth daily. 90 tablet 3  . Lacosamide (VIMPAT) 150 MG TABS Take 1 tablet (150 mg total) by mouth 2 (two) times daily. 60 tablet 5  . losartan (COZAAR) 50 MG tablet TAKE 1 TABLET BY MOUTH EVERY DAY 30 tablet 6  . metoprolol succinate (TOPROL-XL) 100 MG 24 hr tablet TAKE 2 TABLETS (200 MG TOTAL) DAILY BY MOUTH. 180 tablet 1  . Multiple Vitamin (MULTIVITAMIN WITH MINERALS) TABS tablet Take 1 tablet by mouth daily.    . phenytoin (DILANTIN) 100 MG ER capsule Take 1 capsule (100 mg total) by mouth at bedtime. 90 capsule 3  . phenytoin (PHENYTOIN INFATABS) 50 MG tablet CHEW 1 TABLET BY MOUTH AT BEDTIME 90 tablet 3  . polyethylene glycol powder (GLYCOLAX/MIRALAX) powder TAKE 17 GRAMS 2 TIMES A DAY AS NEEDED FOR CONSTIPATION. MIX INTO 8 OF FLUID AND DRINK 500 g 1  . polyethylene glycol-electrolytes (NULYTELY/GOLYTELY) 420 g solution Take 4,000 mLs by mouth as directed. 4000 mL 0  . QUEtiapine (SEROQUEL) 300 MG tablet Take 2 tablets (600 mg total) by mouth at bedtime.  60 tablet 2  . testosterone cypionate (DEPOTESTOSTERONE CYPIONATE) 200 MG/ML injection Inject 0.75 mLs (150 mg total) into the muscle every 14 (fourteen) days.    . ondansetron (ZOFRAN) 4 MG tablet Take 1 tablet (4 mg total) by mouth every 8 (eight) hours as needed for nausea or vomiting. (Patient not taking: Reported on 05/28/2018) 30 tablet 0   No current facility-administered medications for this visit.      Musculoskeletal: Strength & Muscle Tone: within normal limits Gait & Station: normal Patient leans: N/A  Psychiatric Specialty Exam: ROS  Blood pressure 128/74, pulse 76, height 5' 11"  (1.803 m), weight 190 lb (86.2 kg).Body mass index is 26.5 kg/m.  General Appearance: Fairly Groomed  Eye Contact:  Fair  Speech:  Slow  Volume:  Normal  Mood:  Anxious  Affect:  Congruent  Thought Process:  Goal Directed  Orientation:  Full (Time, Place, and Person)  Thought Content: Paranoid Ideation and Rumination   Suicidal Thoughts:  No  Homicidal Thoughts:  No  Memory:  Immediate;   Fair Recent;   Fair Remote;   Fair  Judgement:  Good  Insight:  Fair  Psychomotor Activity:  Decreased  Concentration:  Concentration: Fair and Attention Span: Fair  Recall:  [employer] of Knowledge: Good  Language: Good  Akathisia:  No  Handed:  Right  AIMS (if indicated): not done  Assets:  Communication Skills Desire for Improvement Housing Resilience  ADL's:  Intact  Cognition: WNL  Sleep:  Fair   Screenings: AIMS     Admission (Discharged) from 07/13/2015 in [facility]  AIMS Total Score  0    AUDIT     Admission (Discharged) from 07/13/2015 in [facility] Admission (Discharged) from 05/08/2014 in [facility] Admission (Discharged) from 01/21/2014 in [facility]  Alcohol Use Disorder Identification Test Final Score (AUDIT)  0  0  0    Mini-Mental  Office Visit from 05/15/2018 in [facility] Office Visit from 10/25/2017 in [facility]  Total Score (max 30 points )  27  25    PHQ2-9     Office Visit from 07/26/2017 in [facility] Office Visit from 05/02/2017 in [facility] Office Visit from 01/22/2017 in [facility] Office Visit from 06/11/2016 in [facility] Office Visit from 01/30/2016 in [facility]  PHQ-2 Total Score  6  0  6  0  0  PHQ-9 Total Score  -  -  8  -  -       Assessment and Plan: Schizoaffective disorder, bipolar type.  Anxiety disorder NOS.  Patient is a stable on his current medication.  He had lost weight because his microwave is not working.  I recommended to buy microwave online in order groceries online as patient has anxiety leaving the house.  Encouraged to keep appointment with [name].  Continue Seroquel 600 mg at bedtime, amitriptyline 200 mg at bedtime and Klonopin 1 mg twice a day.  Patient has no more dizziness.  I reviewed records from neurology and recent blood work results.  Recommended to call us back if he has any question or any concern.  Follow-up in 3 months.   Kathlee Nations, MD 05/28/2018, 1:56 PM

## 2018-06-02 ENCOUNTER — Ambulatory Visit (HOSPITAL_COMMUNITY): Payer: Medicaid Other | Admitting: Licensed Clinical Social Worker

## 2018-06-02 ENCOUNTER — Encounter

## 2018-06-21 ENCOUNTER — Other Ambulatory Visit: Payer: Self-pay | Admitting: Family Medicine

## 2018-07-10 ENCOUNTER — Ambulatory Visit (INDEPENDENT_AMBULATORY_CARE_PROVIDER_SITE_OTHER): Payer: Medicaid Other | Admitting: Neurology

## 2018-07-10 ENCOUNTER — Encounter: Payer: Self-pay | Admitting: Neurology

## 2018-07-10 VITALS — BP 97/63 | HR 56 | Ht 71.0 in | Wt 191.0 lb

## 2018-07-10 DIAGNOSIS — R413 Other amnesia: Secondary | ICD-10-CM | POA: Diagnosis not present

## 2018-07-10 NOTE — Progress Notes (Signed)
Reason for visit: Memory disturbance, seizures  [name] is an 62 y.o. male  History of present illness:  [name] is a 62 year old left-handed white male with a history of a schizoaffective disorder, and history of seizures.  The patient has not had any seizures in at least 3 or 3-1/2 years.  The patient had a flurry of seizures at that time and he believes that since that time he has had some decline in memory.  His wife believes that his problems with memory have worsened over the last 4 months.  He was evaluated for memory disorder in November 2018, blood work and MRI of the brain was done at that time.  The patient had reported at that time that he was having intermittent episodes of being confused and staggery, it was felt that it was related to overuse of clonazepam.  The patient has cut back on clonazepam but when he does take it he gets slightly confused and staggery even now.  The patient is on very high dose amitriptyline at 200 mg at night, he takes Seroquel, Dilantin, and Vimpat, all of these medications may have cognitive side effects.  The wife reports that he has difficulty making logical decisions at times, he does not follow through with performing tasks, he does have some generalized short-term memory problems.  He has been under a lot of stress recently which has worsened his cognitive functioning.  His Mini-Mental status examination score today is better now than it was in November 2018.  Past Medical History:  Diagnosis Date  . Anxiety   . Constipation   . Depression   . Dyslipidemia   . GERD (gastroesophageal reflux disease)   . Hearing loss   . History of echocardiogram    Echo 8/18: EF 50-55, normal wall motion, grade 1 diastolic dysfunction, mild MR  . Hypertension   . Memory difficulty 10/25/2017  . Memory loss   . MRSA carrier   . Schizoaffective disorder   . Seizures ([place])     Past Surgical History:  Procedure Laterality Date  . CARPAL TUNNEL  RELEASE    . COLONOSCOPY  2010  . ORIF SHOULDER FRACTURE    . self orchectomy    . SHOULDER CLOSED REDUCTION Left 09/16/2013   Procedure: CLOSED REDUCTION SHOULDER;  Surgeon: [name] Pole, [name];  Location: [facility];  Service: Orthopedics;  Laterality: Left;  . SHOULDER HEMI-ARTHROPLASTY Left 09/18/2013   Procedure: LEFT SHOULDER HEMI-ARTHROPLASTY;  Surgeon: [name] Schooling, [name];  Location: [facility];  Service: Orthopedics;  Laterality: Left;    Family History  Problem Relation Age of Onset  . Stroke Father        Living at 64  . Stroke Mother        Stroke in late 68's. Died in her 54's  . Alcohol abuse Brother   . Alcohol abuse Brother   . Diabetes Brother     Social history:  reports that he has never smoked. He has never used smokeless tobacco. He reports that he does not drink alcohol or use drugs.    Allergies  Allergen Reactions  . Codeine Nausea And Vomiting  . Sulfa Antibiotics Nausea And Vomiting  . Sulfasalazine Nausea And Vomiting    Medications:  Prior to Admission medications   Medication Sig Start Date End Date Taking? Authorizing Provider  amitriptyline (ELAVIL) 100 MG tablet Take 2 tablets (200 mg total) by mouth at bedtime. 05/28/18   [name], [name] Harman, [name]  amLODipine (NORVASC) 5 MG tablet Take 1 tablet (5 mg total) by mouth daily. 07/26/17   [name] Bihari, [name]  aspirin EC 81 MG tablet Take 1 tablet (81 mg total) by mouth daily. 07/21/15   [name], [name] Jarvis, FNP  atorvastatin (LIPITOR) 40 MG tablet Take 1 tablet (40 mg total) by mouth daily. 04/10/18   [name] Rio, [name]  clonazePAM (KLONOPIN) 1 MG tablet Take 1 tablet (1 mg total) by mouth 2 (two) times daily. 05/28/18   [name], [name] Harman, [name]  hydrochlorothiazide (HYDRODIURIL) 25 MG tablet Take 1 tablet (25 mg total) by mouth daily. 07/26/17   [name], Jeani Sow, [name]  Lacosamide (VIMPAT) 150 MG TABS Take 1 tablet (150 mg total) by mouth 2 (two) times daily. 05/15/18   [name] Givens, [name]  losartan (COZAAR) 50 MG tablet  TAKE 1 TABLET BY MOUTH EVERY DAY 03/24/18   [name] Harp, [name]  metoprolol succinate (TOPROL-XL) 100 MG 24 hr tablet TAKE 2 TABLETS (200 MG TOTAL) DAILY BY MOUTH. 05/14/18   [name] Rio, [name]  Multiple Vitamin (MULTIVITAMIN WITH MINERALS) TABS tablet Take 1 tablet by mouth daily.    Provider, Historical, [name]  ondansetron (ZOFRAN) 4 MG tablet Take 1 tablet (4 mg total) by mouth every 8 (eight) hours as needed for nausea or vomiting. Patient not taking: Reported on 05/28/2018 04/01/18   [name] Rio, [name]  phenytoin (DILANTIN) 100 MG ER capsule Take 1 capsule (100 mg total) by mouth at bedtime. 05/15/18   [name] Givens, [name]  phenytoin (PHENYTOIN INFATABS) 50 MG tablet CHEW 1 TABLET BY MOUTH AT BEDTIME 05/15/18   [name], [name] Blossom, [name]  polyethylene glycol powder ([facility] PURELAX) powder TAKE 17 GRAMS 2 TIMES A DAY AS NEEDED FOR CONSTIPATION. MIX INTO 8 OF FLUID AND DRINK 06/25/18   [name] Rio, [name]  polyethylene glycol-electrolytes (NULYTELY/GOLYTELY) 420 g solution Take 4,000 mLs by mouth as directed. 05/27/18   [name] Banister, [name]  QUEtiapine (SEROQUEL) 300 MG tablet Take 2 tablets (600 mg total) by mouth at bedtime. 05/28/18   [name], [name] Harman, [name]  testosterone cypionate (DEPOTESTOSTERONE CYPIONATE) 200 MG/ML injection Inject 0.75 mLs (150 mg total) into the muscle every 14 (fourteen) days. 09/12/16   [name] Rio, [name]    ROS:  Out of a complete 14 system review of symptoms, the patient complains only of the following symptoms, and all other reviewed systems are negative.  Weight gain Hearing loss Constipation Insomnia  Blood pressure 97/63, pulse (!) 56, height 5\' 11"  (1.803 m), weight 191 lb (86.6 kg).  Physical Exam  General: The patient is alert and cooperative at the time of the examination.  The patient is moderately obese.  Skin: No significant peripheral edema is noted.   Neurologic Exam  Mental status: The patient is alert and oriented x 3 at the time of the  examination. The patient has apparent normal recent and remote memory, with an apparently normal attention span and concentration ability.  Mini-Mental status examination done today shows a total score 29/30.   Cranial nerves: Facial symmetry is present. Speech is normal, no aphasia or dysarthria is noted. Extraocular movements are full. Visual fields are full.  Motor: The patient has good strength in all 4 extremities.  Sensory examination: Soft touch sensation is symmetric on the face, arms, and legs.  Coordination: The patient has good finger-nose-finger and heel-to-shin bilaterally.  Gait and station: The patient has a normal gait. Tandem gait is slightly unsteady. Romberg is negative. No  drift is seen.  Reflexes: Deep tendon reflexes are symmetric.   MRI brain 12/01/17:  IMPRESSION: Unremarkable MRI scan of the brain showing only mild changes of chronic microvascular ischemia and generalized cerebral atrophy. Overall no significant change compared with previous MRI dated 07/29/2015.  * MRI scan images were reviewed online. I agree with the written report.    Assessment/Plan:  1.  Reported memory disturbance  2.  Schizoaffective disorder  3.  History of seizures  The patient has reported some troubles with cognitive functioning over the last 3 years, his wife believes that the problems have significantly worsened over the last 4 months.  The patient is on a multitude of medications that may have cognitive side effects, amitriptyline and Seroquel have anticholinergic effects that may go against memory.  The patient has cut back on his clonazepam.  He will undergo neuropsychological evaluation at this time.  If evidence of an organic dementia is present, the patient may be started on Namenda.  He will follow-up for his next scheduled visit.  [name] [name] 07/10/2018 11:29 AM  [facility] [address] [place], [place] [address]  Phone  [phone] Fax [phone]

## 2018-07-10 NOTE — Patient Instructions (Signed)
We will get neuropsychological testing done.

## 2018-07-11 ENCOUNTER — Encounter: Payer: Self-pay | Admitting: Family Medicine

## 2018-07-11 ENCOUNTER — Ambulatory Visit: Payer: Medicaid Other | Admitting: Family Medicine

## 2018-07-11 ENCOUNTER — Other Ambulatory Visit: Payer: Self-pay

## 2018-07-11 VITALS — BP 110/68 | HR 53 | Temp 98.2°F | Ht 71.0 in | Wt 193.0 lb

## 2018-07-11 DIAGNOSIS — R634 Abnormal weight loss: Secondary | ICD-10-CM | POA: Diagnosis not present

## 2018-07-11 DIAGNOSIS — Z0289 Encounter for other administrative examinations: Secondary | ICD-10-CM

## 2018-07-11 DIAGNOSIS — R413 Other amnesia: Secondary | ICD-10-CM

## 2018-07-11 NOTE — Assessment & Plan Note (Signed)
Weight has stabilized and is uptrending slightly Has colonoscopy planned next month. Continue to monitor, patient doing well at present.

## 2018-07-11 NOTE — Patient Instructions (Addendum)
It was great to see you again today!  Come back after you have the neuropsychological testing done. Bring DMV paperwork with you at that time.  Be well, Dr. Ardelia Mems

## 2018-07-11 NOTE — Addendum Note (Signed)
Addended by: Leeanne Rio on: 07/11/2018 02:48 PM   Modules accepted: Orders

## 2018-07-11 NOTE — Progress Notes (Signed)
Date of Visit: 07/11/2018   HPI:  Patient presents to discuss form completion.  Form completion - received notice recently from [facility] DMV that he needs another physician evaluation form completed in order to continue to drive. He did not bring the forms with him today. Needs referral to eye doctor [facility]) in order to have vision part of form completed. Of note he was seen yesterday by neurologist Dr. [name] out of concerns for memory problems. Had MMSE done which was 29/30. Neuropsychological testing has been planned.  Weight loss/poor appetite - checked in with patient on this issue. Weight has gone up. Appetite has picked up some. He saw GI and is to have a colonoscopy in August. No abdominal pain.  ROS: See HPI.  Thomas: history of hyperlipidemia, hypertension, seizure d/o, schizoaffective d/o, androgen deficiency on testosterone therapy, chronic constipation   PHYSICAL EXAM: BP 110/68   Pulse (!) 53   Temp 98.2 F (36.8 C) (Oral)   Ht 5\' 11"  (1.803 m)   Wt 193 lb (87.5 kg)   SpO2 97%   BMI 26.92 kg/m  Gen: no acute distress, pleasant, cooperative HEENT: normocephalic, atraumatic moist mucous membranes  Heart: regular rate and rhythm, no murmur Lungs: normal work of breathing  Abdomen: soft, nontender to palpation   ASSESSMENT/PLAN:  Health maintenance:  -current on HM items  Form completion Patient did not bring driving forms with him today. In the past these have been long and cumbersome to complete, and are best done with him present during a visit so he is able to answer questions. Discussed with patient that as he was just seen yesterday out of concerns for memory problems, and has upcoming neuropsych testing planned, it is best to wait to fill out forms until after that testing is complete. He will bring in the forms to an office visit here once testing is done. He is aware that I am about to go out on maternity leave and that he will need to see another  doctor in the meantime in order to complete forms. Copies of prior forms I have completed are visible in the media tab, for reference.  Unintentional weight loss Weight has stabilized and is uptrending slightly Has colonoscopy planned next month. Continue to monitor, patient doing well at present.  FOLLOW UP: Follow up in several weeks for form completion once neuropsychological testing complete.  [place]. Ardelia Mems, [place]

## 2018-07-23 ENCOUNTER — Encounter: Payer: Self-pay | Admitting: Gastroenterology

## 2018-07-23 ENCOUNTER — Ambulatory Visit (AMBULATORY_SURGERY_CENTER): Payer: Medicaid Other | Admitting: Gastroenterology

## 2018-07-23 VITALS — BP 108/62 | HR 72 | Temp 97.3°F | Resp 13 | Ht 71.0 in | Wt 189.0 lb

## 2018-07-23 DIAGNOSIS — R634 Abnormal weight loss: Secondary | ICD-10-CM | POA: Diagnosis not present

## 2018-07-23 DIAGNOSIS — K2970 Gastritis, unspecified, without bleeding: Secondary | ICD-10-CM | POA: Diagnosis not present

## 2018-07-23 DIAGNOSIS — D123 Benign neoplasm of transverse colon: Secondary | ICD-10-CM

## 2018-07-23 DIAGNOSIS — D122 Benign neoplasm of ascending colon: Secondary | ICD-10-CM

## 2018-07-23 DIAGNOSIS — D120 Benign neoplasm of cecum: Secondary | ICD-10-CM

## 2018-07-23 DIAGNOSIS — K319 Disease of stomach and duodenum, unspecified: Secondary | ICD-10-CM

## 2018-07-23 DIAGNOSIS — K635 Polyp of colon: Secondary | ICD-10-CM

## 2018-07-23 DIAGNOSIS — D124 Benign neoplasm of descending colon: Secondary | ICD-10-CM

## 2018-07-23 HISTORY — PX: COLONOSCOPY: SHX174

## 2018-07-23 HISTORY — PX: UPPER GASTROINTESTINAL ENDOSCOPY: SHX188

## 2018-07-23 MED ORDER — SODIUM CHLORIDE 0.9 % IV SOLN
500.0000 mL | Freq: Once | INTRAVENOUS | Status: DC
Start: 2018-07-23 — End: 2018-08-04

## 2018-07-23 NOTE — Progress Notes (Signed)
Called to room to assist during endoscopic procedure.  Patient ID and intended procedure confirmed with present staff. Received instructions for my participation in the procedure from the performing physician.  

## 2018-07-23 NOTE — Patient Instructions (Signed)
Thank you for allowing [place] to care for you today!  Await pathology results by mail for both the stomach and colon, 2-3 weeks.   YOU HAD AN ENDOSCOPIC PROCEDURE TODAY AT [facility]:   Refer to the procedure report that was given to you for any specific questions about what was found during the examination.  If the procedure report does not answer your questions, please call your gastroenterologist to clarify.  If you requested that your care partner not be given the details of your procedure findings, then the procedure report has been included in a sealed envelope for you to review at your convenience later. YOU SHOULD EXPECT: Some feelings of bloating in the abdomen. Passage of more gas than usual.  Walking can help get rid of the air that was put into your GI tract during the procedure and reduce the bloating. If you had a lower endoscopy (such as a colonoscopy or flexible sigmoidoscopy) you may notice spotting of blood in your stool or on the toilet paper. If you underwent a bowel prep for your procedure, you may not have a normal bowel movement for a few days.  Please Note:  You might notice some irritation and congestion in your nose or some drainage.  This is from the oxygen used during your procedure.  There is no need for concern and it should clear up in a day or so.  SYMPTOMS TO REPORT IMMEDIATELY:   Following lower endoscopy (colonoscopy or flexible sigmoidoscopy):  Excessive amounts of blood in the stool  Significant tenderness or worsening of abdominal pains  Swelling of the abdomen that is new, acute  Fever of 100F or higher   Following upper endoscopy (EGD)  Vomiting of blood or coffee ground material  New chest pain or pain under the shoulder blades  Painful or persistently difficult swallowing  New shortness of breath  Fever of 100F or higher  Black, tarry-looking stools  For urgent or emergent issues, a gastroenterologist can be reached at any hour by  calling [phone].   DIET:  We do recommend a small meal at first, but then you may proceed to your regular diet.  Drink plenty of fluids but you should avoid alcoholic beverages for 24 hours.  ACTIVITY:  You should plan to take it easy for the rest of today and you should NOT DRIVE or use heavy machinery until tomorrow (because of the sedation medicines used during the test).    FOLLOW UP: Our staff will call the number listed on your records the next business day following your procedure to check on you and address any questions or concerns that you may have regarding the information given to you following your procedure. If we do not reach you, we will leave a message.  However, if you are feeling well and you are not experiencing any problems, there is no need to return our call.  We will assume that you have returned to your regular daily activities without incident.  If any biopsies were taken you will be contacted by phone or by letter within the next 1-3 weeks.  Please call us at [phone] if you have not heard about the biopsies in 3 weeks.    SIGNATURES/CONFIDENTIALITY: You and/or your care partner have signed paperwork which will be entered into your electronic medical record.  These signatures attest to the fact that that the information above on your After Visit Summary has been reviewed and is understood.  Full responsibility  of the confidentiality of this discharge information lies with you and/or your care-partner.

## 2018-07-23 NOTE — Progress Notes (Signed)
To PACU, VSS. Report to [email or web address] 

## 2018-07-23 NOTE — Progress Notes (Signed)
Pt's states no medical or surgical changes since previsit or office visit. 

## 2018-07-23 NOTE — Op Note (Signed)
[facility] Patient Name: [name] Procedure Date: 07/23/2018 2:43 PM MRN: [id number] Endoscopist: Milus Banister , MD Age: 62 Referring MD:  Date of Birth: 11-25-1956 Gender: Male Account #: [id number] Procedure:                Colonoscopy Indications:              Weight loss; reports polyps during colonoscoy 8-10                            years ago (doesn't remember where, when, why this                            was done, what exactly was found, who the MD was) Medicines:                Monitored Anesthesia Care Procedure:                Pre-Anesthesia Assessment:                           - Prior to the procedure, a History and Physical                            was performed, and patient medications and                            allergies were reviewed. The patient's tolerance of                            previous anesthesia was also reviewed. The risks                            and benefits of the procedure and the sedation                            options and risks were discussed with the patient.                            All questions were answered, and informed consent                            was obtained. Prior Anticoagulants: The patient has                            taken no previous anticoagulant or antiplatelet                            agents. ASA Grade Assessment: II - A patient with                            mild systemic disease. After reviewing the risks                            and benefits, the patient was deemed in  satisfactory condition to undergo the procedure.                           After obtaining informed consent, the colonoscope                            was passed under direct vision. Throughout the                            procedure, the patient's blood pressure, pulse, and                            oxygen saturations were monitored continuously. The                            Colonoscope  was introduced through the anus and                            advanced to the the cecum, identified by                            appendiceal orifice and ileocecal valve. The                            colonoscopy was performed without difficulty. The                            patient tolerated the procedure well. The quality                            of the bowel preparation was adequate. The                            ileocecal valve, appendiceal orifice, and rectum                            were photographed. Scope In: 3:04:59 PM Scope Out: 3:26:23 PM Scope Withdrawal Time: 0 hours 15 minutes 10 seconds  Total Procedure Duration: 0 hours 21 minutes 24 seconds  Findings:                 Nine sessile polyps were found in the descending                            colon, transverse colon, ascending colon and cecum.                            The polyps were 2 to 5 mm in size. These polyps                            were removed with a cold snare. Resection and                            retrieval were complete.  The exam was otherwise without abnormality on                            direct and retroflexion views. Complications:            No immediate complications. Estimated blood loss:                            None. Estimated Blood Loss:     Estimated blood loss: none. Impression:               - Nine 2 to 5 mm polyps in the descending colon, in                            the transverse colon, in the ascending colon and in                            the cecum, removed with a cold snare. Resected and                            retrieved.                           - The examination was otherwise normal on direct                            and retroflexion views. Recommendation:           - Patient has a contact number available for                            emergencies. The signs and symptoms of potential                            delayed complications  were discussed with the                            patient. Return to normal activities tomorrow.                            Written discharge instructions were provided to the                            patient.                           - Resume previous diet.                           - Continue present medications.                           You will receive a letter within 2-3 weeks with the                            pathology results and my final recommendations.  If the polyp(s) is proven to be 'pre-cancerous' on                            pathology, you will need repeat colonoscopy in 3                            years. Milus Banister, MD 07/23/2018 3:37:31 PM This report has been signed electronically.

## 2018-07-23 NOTE — Op Note (Addendum)
[facility] Patient Name: [name] Procedure Date: 07/23/2018 2:43 PM MRN: [id number] Endoscopist: Milus Banister , MD Age: 62 Referring MD:  Date of Birth: 06-Sep-1956 Gender: Male Account #: [id number] Procedure:                Upper GI endoscopy Indications:              Anorexia, Weight loss Medicines:                Monitored Anesthesia Care Procedure:                Pre-Anesthesia Assessment:                           - Prior to the procedure, a History and Physical                            was performed, and patient medications and                            allergies were reviewed. The patient's tolerance of                            previous anesthesia was also reviewed. The risks                            and benefits of the procedure and the sedation                            options and risks were discussed with the patient.                            All questions were answered, and informed consent                            was obtained. Prior Anticoagulants: The patient has                            taken no previous anticoagulant or antiplatelet                            agents. ASA Grade Assessment: II - A patient with                            mild systemic disease. After reviewing the risks                            and benefits, the patient was deemed in                            satisfactory condition to undergo the procedure.                           After obtaining informed consent, the endoscope was  passed under direct vision. Throughout the                            procedure, the patient's blood pressure, pulse, and                            oxygen saturations were monitored continuously. The                            Endoscope was introduced through the mouth, and                            advanced to the second part of duodenum. The upper                            GI endoscopy was accomplished without  difficulty.                            The patient tolerated the procedure well. Scope In: Scope Out: Findings:                 Normal duodenum, biopsied to check for Celiac Sprue                            given his weight loss.                           Moderate inflammation characterized by erythema and                            friability was found in the gastric antrum.                            Biopsies were taken with a cold forceps for                            histology.                           The exam was otherwise without abnormality. Complications:            No immediate complications. Estimated blood loss:                            None. Estimated Blood Loss:     Estimated blood loss: none. Impression:               - Mild gastritis, biopsied to check for H. pylori.                           - Normal duodenum was biopsied to check for Celiac                            Sprue.                           -  The examination was otherwise normal. Recommendation:           - Patient has a contact number available for                            emergencies. The signs and symptoms of potential                            delayed complications were discussed with the                            patient. Return to normal activities tomorrow.                            Written discharge instructions were provided to the                            patient.                           - Resume previous diet.                           - Continue present medications.                           - Await pathology results. Milus Banister, MD 07/23/2018 3:40:05 PM This report has been signed electronically.

## 2018-07-24 ENCOUNTER — Telehealth: Payer: Self-pay

## 2018-07-24 ENCOUNTER — Other Ambulatory Visit (HOSPITAL_COMMUNITY): Payer: Self-pay | Admitting: Psychiatry

## 2018-07-24 DIAGNOSIS — F251 Schizoaffective disorder, depressive type: Secondary | ICD-10-CM

## 2018-07-24 NOTE — Telephone Encounter (Signed)
No answer/unable to leave voicemail

## 2018-07-24 NOTE — Telephone Encounter (Signed)
  Follow up Call-  Call [name] number 07/23/2018  Post procedure Call [name] phone  # [phone]  Permission to leave phone message Yes  Some recent data might be hidden     Patient questions:  Do you have a fever, pain , or abdominal swelling? No. Pain Score  0 *  Have you tolerated food without any problems? Yes.    Have you been able to return to your normal activities? Yes.    Do you have any questions about your discharge instructions: Diet   No. Medications  No. Follow up visit  No.  Do you have questions or concerns about your Care? No.  Actions: * If pain score is 4 or above: No action needed, pain <4.

## 2018-07-29 ENCOUNTER — Encounter: Payer: Self-pay | Admitting: Psychology

## 2018-07-29 NOTE — Progress Notes (Deleted)
NEUROBEHAVIORAL STATUS EXAM   Name: [name] Date of Birth: 07-15-1956 Date of Interview: 07/29/2018  Reason for Referral:  [name] is a 62 y.o. left-handed male who is referred for neuropsychological evaluation by Dr. Floyde Parkins of [facility] due to concerns about memory disorder. This patient is accompanied in the office by his *** who supplements the history.  History of Presenting Problem:  Dr. [name] 07/10/2018 history of a schizoaffective disorder, and history of seizures.  The patient has not had any seizures in at least 3 or 3-1/2 years.  The patient had a flurry of seizures at that time and he believes that since that time he has had some decline in memory.  His wife believes that his problems with memory have worsened over the last 4 months.  He was evaluated for memory disorder in November 2018, blood work and MRI of the brain was done at that time.  The patient had reported at that time that he was having intermittent episodes of being confused and staggery, it was felt that it was related to overuse of clonazepam.  The patient has cut back on clonazepam but when he does take it he gets slightly confused and staggery even now.  The patient is on very high dose amitriptyline at 200 mg at night, he takes Seroquel, Dilantin, and Vimpat, all of these medications may have cognitive side effects.  The wife reports that he has difficulty making logical decisions at times, he does not follow through with performing tasks, he does have some generalized short-term memory problems.  He has been under a lot of stress recently which has worsened his cognitive functioning.  His Mini-Mental status examination score today is better now than it was in November 2018.  The patient has reported some troubles with cognitive functioning over the last 3 years, his wife believes that the problems have significantly worsened over the last 4 months.  The patient is on a multitude of  medications that may have cognitive side effects, amitriptyline and Seroquel have anticholinergic effects that may go against memory.  The patient has cut back on his clonazepam.  He will undergo neuropsychological evaluation at this time.  If evidence of an organic dementia is present, the patient may be started on Namenda.   Psychiatry He has chronic paranoia and sometimes he does not leave the house because he feels very scared with the strangers.  He lost 20 pounds in past 3 months.  He is seeing [name] for therapy. Past psychiatric hx: Patient has depression since his college. He has at least 15 hospitalization due to severe depression, paranoia and suicidal thoughts. His last hospitalization was in August 2016. At that time he was thinking to kill himself by inhaling helium. He was discharged on Seroquel 900 mg and amitriptyline 400 mgat bedtime.He had history of suicidal attempt when he cut his testicle because he does not want to be depressed. He was seeing [name] at [facility] and then moved to [name]. Patient has multiple trial of SSRIs however he had reaction to SSRIs. In the past he had tried nortriptyline, Haldol, Geodon, Seroquel, lithium, Prozac, Wellbutrin, Paxil, Ativan, Lexapro and Zoloft. Patient denies any history of mania or any drug use.   MRI 11/30/2017 IMPRESSION: Unremarkable MRI scan of the brain showing only mild changes of chronic microvascular ischemia and generalized cerebral atrophy.  Overall no significant change compared with previous MRI dated 07/29/2015.   PATIENT:  Onset/Course  Upon direct questioning, the patient/caregiver reported:   Forgetting recent conversations/events:  Repeating statements/questions: Misplacing/losing items: Forgetting appointments or other obligations: Forgetting to take medications:  Difficulty concentrating: Starting but not finishing tasks: Distracted easily: Processing information  more slowly:  Word-finding difficulty: Word substitutions: Writing difficulty: Spelling difficulty: Comprehension difficulty:  Getting lost when driving: Making wrong turns when driving: Uncertain about directions when driving or passenger:    Family neuro hx: Any family hx dementia?   Current Functioning: Work:  Complex ADLs Driving: Medication management: Management of finances: Appointments: Cooking:     Medical/Physical complaints:  Any hx of stroke/TIA, MI, LOC/TBI, Sz? Hx falls?  Balance, probs walking? Sleep: Insomnia? OSA? CPAP? REM sleep beh sx? Visual illusions/hallucinations? Appetite/Nutrition/Weight changes       Current mood:  Depressed mood Anxiety Stress  Behavioral disturbance/Personality change  Suicidal Ideation/Intention:   Psychiatric History: Per records: Patient has depression since his college. He has at least 15 hospitalization due to severe depression, paranoia and suicidal thoughts. His last hospitalization was in August 2016. At that time he was thinking to kill himself by inhaling helium. He was discharged on Seroquel 900 mg and amitriptyline 400 mgat bedtime.He had history of suicidal attempt when he cut his testicle because he does not want to be depressed. He was seeing [name] at [place] and then moved to [name]. Patient has multiple trial of SSRIs however he had reaction to SSRIs. In the past he had tried nortriptyline, Haldol, Geodon, Seroquel, lithium, Prozac, Wellbutrin, Paxil, Ativan, Lexapro and Zoloft. Patient denies any history of mania or any drug use.      Social History: Born/Raised:  Education: Occupational history: Marital history: Children: Alcohol:  Tobacco: SA:  Medical History: Past Medical History:  Diagnosis Date  . Anxiety   . Constipation   . Depression   . Dyslipidemia   . GERD (gastroesophageal reflux disease)   . Hearing loss   .  History of echocardiogram    Echo 8/18: EF 50-55, normal wall motion, grade 1 diastolic dysfunction, mild MR  . Hypertension   . Memory difficulty 10/25/2017  . Memory loss   . MRSA carrier   . Schizoaffective disorder   . Seizures ([place])       Current Medications:  Outpatient Encounter Medications as of 07/29/2018  Medication Sig  . amitriptyline (ELAVIL) 100 MG tablet Take 2 tablets (200 mg total) by mouth at bedtime.  Marland Kitchen amLODipine (NORVASC) 5 MG tablet Take 1 tablet (5 mg total) by mouth daily.  Marland Kitchen aspirin EC 81 MG tablet Take 1 tablet (81 mg total) by mouth daily.  Marland Kitchen atorvastatin (LIPITOR) 40 MG tablet Take 1 tablet (40 mg total) by mouth daily.  . clonazePAM (KLONOPIN) 1 MG tablet Take 1 tablet (1 mg total) by mouth 2 (two) times daily.  . hydrochlorothiazide (HYDRODIURIL) 25 MG tablet Take 1 tablet (25 mg total) by mouth daily.  . Lacosamide (VIMPAT) 150 MG TABS Take 1 tablet (150 mg total) by mouth 2 (two) times daily.  Marland Kitchen losartan (COZAAR) 50 MG tablet TAKE 1 TABLET BY MOUTH EVERY DAY  . metoprolol succinate (TOPROL-XL) 100 MG 24 hr tablet TAKE 2 TABLETS (200 MG TOTAL) DAILY BY MOUTH.  . Multiple Vitamin (MULTIVITAMIN WITH MINERALS) TABS tablet Take 1 tablet by mouth daily.  . phenytoin (DILANTIN) 100 MG ER capsule Take 1 capsule (100 mg total) by mouth at bedtime.  . phenytoin (PHENYTOIN INFATABS) 50 MG tablet CHEW 1 TABLET BY  MOUTH AT BEDTIME  . QUEtiapine (SEROQUEL) 300 MG tablet Take 2 tablets (600 mg total) by mouth at bedtime.  Marland Kitchen testosterone cypionate (DEPOTESTOSTERONE CYPIONATE) 200 MG/ML injection Inject 0.75 mLs (150 mg total) into the muscle every 14 (fourteen) days.   Facility-Administered Encounter Medications as of 07/29/2018  Medication  . 0.9 %  sodium chloride infusion     Behavioral Observations:   Appearance: Neatly, casually and appropriately dressed and groomed*** Gait: Ambulated independently, no gross abnormalities observed*** Speech: Fluent; normal  rate, rhythm and volume. *** word finding difficulty. Thought process: Linear, goal directed*** Affect: Full, anxious*** Interpersonal: Pleasant, appropriate***   *** minutes spent face-to-face with patient completing neurobehavioral status exam. *** minutes spent integrating medical records/clinical data and completing this report. [id number] unit; [id number]***.   TESTING: There is medical necessity to proceed with neuropsychological assessment as the results will be used to aid in differential diagnosis and clinical decision-making and to inform specific treatment recommendations. Per the patient, *** and medical records reviewed, there has been a change in cognitive functioning and a reasonable suspicion of dementia***.  Clinical Decision Making: In considering the patient's current level of functioning, level of presumed impairment, nature of symptoms, emotional and behavioral responses during the interview, level of literacy, and observed level of motivation, a battery of tests was selected and communicated to the psychometrician.   ***Option 1:  Following the clinical interview/neurobehavioral status exam, the patient completed this full battery of neuropsychological testing with my psychometrician under my supervision (see separate note).   PLAN: The patient will return to see me for a follow-up session at which time his test performances and my impressions and treatment recommendations will be reviewed in detail.  Evaluation ongoing; full report to follow.  ***Option 2:  PLAN: The patient will return to complete the above referenced full battery of neuropsychological testing with a psychometrician under my supervision. Education regarding testing procedures was provided to the patient. Subsequently, the patient will see this provider for a follow-up session at which time his test performances and my impressions and treatment recommendations will be reviewed in detail.  Evaluation  ongoing; full report to follow.

## 2018-08-01 ENCOUNTER — Telehealth: Payer: Self-pay | Admitting: *Deleted

## 2018-08-01 DIAGNOSIS — R413 Other amnesia: Secondary | ICD-10-CM

## 2018-08-01 NOTE — Telephone Encounter (Signed)
[name]  [phone]  Pt wants it sent to this place instead. [name] Kennon Holter, CMA

## 2018-08-01 NOTE — Telephone Encounter (Signed)
Pt was late for his appt to Dr. [name].  They rescheduled him for March but he will lose his license if the for is not completed.  They suggested he try to see if the following can get him in quicker:  [facility] Address: [address] [place], [place] [address] Phone: [phone]  He calls because he will need a referral sent in order to go. Enaya Howze, Salome Spotted, CMA

## 2018-08-01 NOTE — Telephone Encounter (Signed)
I put in the referral  Thanks  LC 

## 2018-08-04 ENCOUNTER — Encounter (HOSPITAL_COMMUNITY): Payer: Self-pay

## 2018-08-04 ENCOUNTER — Other Ambulatory Visit: Payer: Self-pay

## 2018-08-04 ENCOUNTER — Encounter: Payer: Self-pay | Admitting: Gastroenterology

## 2018-08-04 ENCOUNTER — Emergency Department (HOSPITAL_COMMUNITY): Payer: Medicaid Other

## 2018-08-04 ENCOUNTER — Inpatient Hospital Stay (HOSPITAL_COMMUNITY)
Admission: EM | Admit: 2018-08-04 | Discharge: 2018-08-08 | DRG: 871 | Disposition: A | Discharge status: Home Health | Payer: Medicaid Other | Attending: Internal Medicine | Admitting: Internal Medicine

## 2018-08-04 DIAGNOSIS — Z811 Family history of alcohol abuse and dependence: Secondary | ICD-10-CM

## 2018-08-04 DIAGNOSIS — I9589 Other hypotension: Secondary | ICD-10-CM | POA: Diagnosis not present

## 2018-08-04 DIAGNOSIS — Z8701 Personal history of pneumonia (recurrent): Secondary | ICD-10-CM | POA: Diagnosis not present

## 2018-08-04 DIAGNOSIS — Z882 Allergy status to sulfonamides status: Secondary | ICD-10-CM | POA: Diagnosis not present

## 2018-08-04 DIAGNOSIS — J9601 Acute respiratory failure with hypoxia: Secondary | ICD-10-CM | POA: Diagnosis present

## 2018-08-04 DIAGNOSIS — E785 Hyperlipidemia, unspecified: Secondary | ICD-10-CM | POA: Diagnosis present

## 2018-08-04 DIAGNOSIS — N390 Urinary tract infection, site not specified: Secondary | ICD-10-CM | POA: Diagnosis present

## 2018-08-04 DIAGNOSIS — R402142 Coma scale, eyes open, spontaneous, at arrival to emergency department: Secondary | ICD-10-CM | POA: Diagnosis present

## 2018-08-04 DIAGNOSIS — E861 Hypovolemia: Secondary | ICD-10-CM

## 2018-08-04 DIAGNOSIS — Z885 Allergy status to narcotic agent status: Secondary | ICD-10-CM

## 2018-08-04 DIAGNOSIS — D696 Thrombocytopenia, unspecified: Secondary | ICD-10-CM | POA: Diagnosis present

## 2018-08-04 DIAGNOSIS — I5031 Acute diastolic (congestive) heart failure: Secondary | ICD-10-CM | POA: Diagnosis present

## 2018-08-04 DIAGNOSIS — R0602 Shortness of breath: Secondary | ICD-10-CM

## 2018-08-04 DIAGNOSIS — Z96612 Presence of left artificial shoulder joint: Secondary | ICD-10-CM | POA: Diagnosis present

## 2018-08-04 DIAGNOSIS — N189 Chronic kidney disease, unspecified: Secondary | ICD-10-CM | POA: Diagnosis present

## 2018-08-04 DIAGNOSIS — Z7982 Long term (current) use of aspirin: Secondary | ICD-10-CM

## 2018-08-04 DIAGNOSIS — H9190 Unspecified hearing loss, unspecified ear: Secondary | ICD-10-CM | POA: Diagnosis present

## 2018-08-04 DIAGNOSIS — A419 Sepsis, unspecified organism: Principal | ICD-10-CM | POA: Diagnosis present

## 2018-08-04 DIAGNOSIS — F259 Schizoaffective disorder, unspecified: Secondary | ICD-10-CM | POA: Diagnosis present

## 2018-08-04 DIAGNOSIS — I130 Hypertensive heart and chronic kidney disease with heart failure and stage 1 through stage 4 chronic kidney disease, or unspecified chronic kidney disease: Secondary | ICD-10-CM | POA: Diagnosis present

## 2018-08-04 DIAGNOSIS — R6521 Severe sepsis with septic shock: Secondary | ICD-10-CM | POA: Diagnosis present

## 2018-08-04 DIAGNOSIS — N179 Acute kidney failure, unspecified: Secondary | ICD-10-CM | POA: Diagnosis present

## 2018-08-04 DIAGNOSIS — G40909 Epilepsy, unspecified, not intractable, without status epilepticus: Secondary | ICD-10-CM | POA: Diagnosis present

## 2018-08-04 DIAGNOSIS — K219 Gastro-esophageal reflux disease without esophagitis: Secondary | ICD-10-CM | POA: Diagnosis present

## 2018-08-04 DIAGNOSIS — Z823 Family history of stroke: Secondary | ICD-10-CM | POA: Diagnosis not present

## 2018-08-04 DIAGNOSIS — R402252 Coma scale, best verbal response, oriented, at arrival to emergency department: Secondary | ICD-10-CM | POA: Diagnosis present

## 2018-08-04 DIAGNOSIS — Z22322 Carrier or suspected carrier of Methicillin resistant Staphylococcus aureus: Secondary | ICD-10-CM | POA: Diagnosis not present

## 2018-08-04 DIAGNOSIS — I110 Hypertensive heart disease with heart failure: Secondary | ICD-10-CM | POA: Diagnosis present

## 2018-08-04 DIAGNOSIS — Z833 Family history of diabetes mellitus: Secondary | ICD-10-CM | POA: Diagnosis not present

## 2018-08-04 DIAGNOSIS — R402362 Coma scale, best motor response, obeys commands, at arrival to emergency department: Secondary | ICD-10-CM | POA: Diagnosis present

## 2018-08-04 DIAGNOSIS — J810 Acute pulmonary edema: Secondary | ICD-10-CM | POA: Diagnosis not present

## 2018-08-04 LAB — CBC WITH DIFFERENTIAL/PLATELET
Basophils Absolute: 0 10*3/uL (ref 0.0–0.1)
Basophils Relative: 0 %
Eosinophils Absolute: 0 10*3/uL (ref 0.0–0.7)
Eosinophils Relative: 0 %
HCT: 39.7 % (ref 39.0–52.0)
Hemoglobin: 13.9 g/dL (ref 13.0–17.0)
Lymphocytes Relative: 2 %
Lymphs Abs: 0.2 10*3/uL — ABNORMAL LOW (ref 0.7–4.0)
MCH: 33.2 pg (ref 26.0–34.0)
MCHC: 35 g/dL (ref 30.0–36.0)
MCV: 94.7 fL (ref 78.0–100.0)
Monocytes Absolute: 0.1 10*3/uL (ref 0.1–1.0)
Monocytes Relative: 0 %
Neutro Abs: 12.6 10*3/uL (ref 1.7–7.7)
Neutrophils Relative %: 98 %
Platelets: 109 10*3/uL — ABNORMAL LOW (ref 150–400)
RBC: 4.19 MIL/uL — ABNORMAL LOW (ref 4.22–5.81)
RDW: 13.9 % (ref 11.5–15.5)
WBC: 12.9 10*3/uL — ABNORMAL HIGH (ref 4.0–10.5)

## 2018-08-04 LAB — URINALYSIS, ROUTINE W REFLEX MICROSCOPIC
Bilirubin Urine: NEGATIVE
Glucose, UA: NEGATIVE mg/dL
Ketones, ur: NEGATIVE mg/dL
Nitrite: POSITIVE — AB
Protein, ur: 30 mg/dL — AB
RBC / HPF: >50 RBC/hpf — ABNORMAL HIGH (ref 0–5)
Specific Gravity, Urine: 1.012 (ref 1.005–1.030)
WBC, UA: >50 WBC/hpf — ABNORMAL HIGH (ref 0–5)
pH: 5 (ref 5.0–8.0)

## 2018-08-04 LAB — COMPREHENSIVE METABOLIC PANEL
ALT: 264 U/L — ABNORMAL HIGH (ref 0–44)
AST: 156 U/L — ABNORMAL HIGH (ref 15–41)
Albumin: 3.4 g/dL — ABNORMAL LOW (ref 3.5–5.0)
Alkaline Phosphatase: 245 U/L — ABNORMAL HIGH (ref 38–126)
Anion gap: 11 (ref 5–15)
BUN: 35 mg/dL — ABNORMAL HIGH (ref 8–23)
CO2: 21 mmol/L — ABNORMAL LOW (ref 22–32)
Calcium: 8.4 mg/dL — ABNORMAL LOW (ref 8.9–10.3)
Chloride: 106 mmol/L (ref 98–111)
Creatinine, Ser: 1.6 mg/dL — ABNORMAL HIGH (ref 0.61–1.24)
GFR calc Af Amer: 51 mL/min — ABNORMAL LOW (ref >60)
GFR calc non Af Amer: 44 mL/min — ABNORMAL LOW (ref >60)
Glucose, Bld: 162 mg/dL — ABNORMAL HIGH (ref 70–99)
Potassium: 3.6 mmol/L (ref 3.5–5.1)
Sodium: 138 mmol/L (ref 135–145)
Total Bilirubin: 0.9 mg/dL (ref 0.3–1.2)
Total Protein: 6.4 g/dL — ABNORMAL LOW (ref 6.5–8.1)

## 2018-08-04 LAB — PHENYTOIN LEVEL, TOTAL: Phenytoin Lvl: <2.5 ug/mL — ABNORMAL LOW (ref 10.0–20.0)

## 2018-08-04 LAB — I-STAT CG4 LACTIC ACID, ED
Lactic Acid, Venous: 3.77 mmol/L (ref 0.5–1.9)
Lactic Acid, Venous: 1.46 mmol/L (ref 0.5–1.9)

## 2018-08-04 LAB — PROTIME-INR
INR: 1.2
Prothrombin Time: 15.1 seconds (ref 11.4–15.2)

## 2018-08-04 LAB — MRSA PCR SCREENING: MRSA by PCR: NEGATIVE

## 2018-08-04 LAB — STREP PNEUMONIAE URINARY ANTIGEN: Strep Pneumo Urinary Antigen: NEGATIVE

## 2018-08-04 MED ORDER — ONDANSETRON HCL 4 MG/2ML IJ SOLN
4.0000 mg | Freq: Four times a day (QID) | INTRAMUSCULAR | Status: DC | PRN
  Administered 2018-08-04 – 2018-08-08 (×9): 4 mg via INTRAVENOUS
  Filled 2018-08-04 (×9): qty 2

## 2018-08-04 MED ORDER — SODIUM CHLORIDE 0.9 % IV SOLN
2.0000 g | Freq: Two times a day (BID) | INTRAVENOUS | Status: DC
  Administered 2018-08-04: 2 g via INTRAVENOUS
  Filled 2018-08-04 (×2): qty 2

## 2018-08-04 MED ORDER — CLONAZEPAM 0.5 MG PO TABS: 1.0000 mg | ORAL_TABLET | Freq: Two times a day (BID) | ORAL | Status: DC

## 2018-08-04 MED ORDER — QUETIAPINE FUMARATE 100 MG PO TABS
600.0000 mg | ORAL_TABLET | Freq: Every day | ORAL | Status: DC
  Administered 2018-08-04 – 2018-08-07 (×4): 600 mg via ORAL
  Filled 2018-08-04: qty 2
  Filled 2018-08-04: qty 6
  Filled 2018-08-04: qty 2
  Filled 2018-08-04 (×3): qty 6
  Filled 2018-08-04: qty 2

## 2018-08-04 MED ORDER — METRONIDAZOLE IN NACL 5-0.79 MG/ML-% IV SOLN
500.0000 mg | Freq: Three times a day (TID) | INTRAVENOUS | Status: DC
  Administered 2018-08-04: 500 mg via INTRAVENOUS
  Filled 2018-08-04: qty 100

## 2018-08-04 MED ORDER — CEFEPIME HCL 2 G IJ SOLR
2.0000 g | Freq: Once | INTRAMUSCULAR | Status: AC
  Administered 2018-08-04: 2 g via INTRAVENOUS
  Filled 2018-08-04: qty 2

## 2018-08-04 MED ORDER — PHENYTOIN 50 MG PO CHEW
50.0000 mg | CHEWABLE_TABLET | Freq: Every day | ORAL | Status: DC
  Administered 2018-08-04 – 2018-08-07 (×4): 50 mg via ORAL
  Filled 2018-08-04 (×4): qty 1

## 2018-08-04 MED ORDER — LACOSAMIDE 50 MG PO TABS
150.0000 mg | ORAL_TABLET | Freq: Two times a day (BID) | ORAL | Status: DC
  Administered 2018-08-04 – 2018-08-08 (×8): 150 mg via ORAL
  Filled 2018-08-04 (×8): qty 3

## 2018-08-04 MED ORDER — VANCOMYCIN HCL IN DEXTROSE 1-5 GM/200ML-% IV SOLN
1000.0000 mg | Freq: Once | INTRAVENOUS | Status: AC
  Administered 2018-08-04: 1000 mg via INTRAVENOUS
  Filled 2018-08-04: qty 200

## 2018-08-04 MED ORDER — ACETAMINOPHEN 325 MG PO TABS
650.0000 mg | ORAL_TABLET | ORAL | Status: DC | PRN
Start: 2018-08-04 — End: 2018-08-08
  Administered 2018-08-05 – 2018-08-06 (×4): 650 mg via ORAL
  Filled 2018-08-04 (×4): qty 2

## 2018-08-04 MED ORDER — SODIUM CHLORIDE 0.9 % IV BOLUS (SEPSIS)
1000.0000 mL | Freq: Once | INTRAVENOUS | Status: AC
  Administered 2018-08-04: 1000 mL via INTRAVENOUS

## 2018-08-04 MED ORDER — SODIUM CHLORIDE 0.9 % IV SOLN: 250.0000 mL | INTRAVENOUS | Status: DC | PRN

## 2018-08-04 MED ORDER — AMITRIPTYLINE HCL 25 MG PO TABS
200.0000 mg | ORAL_TABLET | Freq: Every day | ORAL | Status: DC
  Administered 2018-08-04 – 2018-08-07 (×4): 200 mg via ORAL
  Filled 2018-08-04 (×4): qty 8

## 2018-08-04 MED ORDER — ATORVASTATIN CALCIUM 40 MG PO TABS
40.0000 mg | ORAL_TABLET | Freq: Every day | ORAL | Status: DC
  Administered 2018-08-04 – 2018-08-08 (×5): 40 mg via ORAL
  Filled 2018-08-04 (×5): qty 1

## 2018-08-04 MED ORDER — SODIUM CHLORIDE 0.9 % IV SOLN
1000.0000 mL | INTRAVENOUS | Status: DC
  Administered 2018-08-04: 1000 mL via INTRAVENOUS

## 2018-08-04 MED ORDER — TESTOSTERONE CYPIONATE 200 MG/ML IM SOLN: 150.0000 mg | INTRAMUSCULAR | Status: DC

## 2018-08-04 MED ORDER — ENOXAPARIN SODIUM 40 MG/0.4ML ~~LOC~~ SOLN
40.0000 mg | SUBCUTANEOUS | Status: DC
  Administered 2018-08-04 – 2018-08-07 (×4): 40 mg via SUBCUTANEOUS
  Filled 2018-08-04 (×3): qty 0.4

## 2018-08-04 MED ORDER — SODIUM CHLORIDE 0.9 % IV SOLN
INTRAVENOUS | Status: DC
  Administered 2018-08-04 – 2018-08-05 (×2): via INTRAVENOUS

## 2018-08-04 MED ORDER — PHENYTOIN SODIUM EXTENDED 100 MG PO CAPS
100.0000 mg | ORAL_CAPSULE | Freq: Every day | ORAL | Status: DC
  Administered 2018-08-04 – 2018-08-07 (×4): 100 mg via ORAL
  Filled 2018-08-04 (×4): qty 1

## 2018-08-04 MED ORDER — ADULT MULTIVITAMIN W/MINERALS CH
1.0000 | ORAL_TABLET | Freq: Every day | ORAL | Status: DC
  Administered 2018-08-04 – 2018-08-08 (×5): 1 via ORAL
  Filled 2018-08-04 (×5): qty 1

## 2018-08-04 NOTE — ED Provider Notes (Signed)
Care transferred to me.  The patient is septic and due to his persistent low blood pressures is septic shock.  He is mentating okay and his blood pressures are slowly coming up with his 30 cc/KG bolus.  Lab work notable for mild WBC elevation and acute kidney injury along with mild LFT abnormalities.  No abdominal pain.  The shortness of breath seems to be improving with the fluids.  Urine has just now been obtained and currently still pending.  I discussed with ICU, Dr. Ander Slade, who will come eval. as for fluids to be continued at 100 mL/hour with normal saline.  He has been given broad antibiotics.  He currently is protecting his airway and oxygenation is normal and he does not appear to need acute airway management.  CRITICAL CARE Performed by: Ephraim Hamburger   Total critical care time: 35 minutes  Critical care time was exclusive of separately billable procedures and treating other patients.  Critical care was necessary to treat or prevent imminent or life-threatening deterioration.  Critical care was time spent personally by me on the following activities: development of treatment plan with patient and/or surrogate as well as nursing, discussions with consultants, evaluation of patient's response to treatment, examination of patient, obtaining history from patient or surrogate, ordering and performing treatments and interventions, ordering and review of laboratory studies, ordering and review of radiographic studies, pulse oximetry and re-evaluation of patient's condition.    Sherwood Gambler, MD 08/04/18 [phone]

## 2018-08-04 NOTE — ED Notes (Signed)
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 Per wife, patient has been smoking an unknown substance in house which is causing him to cough. +nausea. Patient given 4mg  zofran by EMS.

## 2018-08-04 NOTE — Progress Notes (Signed)
The patient is not able to confirm his home medications. His visitor/friend also cannot. I have added last filled dates to each prescription medication on his list but am unable to verify if or how he takes them. Please contact pharmacy if his mental status improves.   Romeo Rabon, PharmD. Mobile: [phone]. 08/04/2018,11:55 AM.

## 2018-08-04 NOTE — H&P (Signed)
Name: [name] MRN: [id number] DOB: Feb 26, 1956    LOS: 0  Admission date: 08/04/2018   PULMONARY / CRITICAL CARE MEDICINE    Chief complaint:  .   Fatigue .  Shortness of breath .  Fever  HPI:  Patient came in with a 2-day history of shortness of breath, cough, just feeling generally under the weather Denies any nausea no vomiting No abdominal pain or discomfort Denies any dysuria Has had a cough, nonproductive, no chest pains or chest discomfort He admits to feeling more short of breath on his usual  He did have a fever measured at 102 He was treated with ibuprofen and Tylenol and fluid bolus  He had a pneumonia about 4 years ago Never smoker  Has been relatively healthy in the last months He has multiple comorbidities  Past Medical History:  Diagnosis Date  . Anxiety   . Constipation   . Depression   . Dyslipidemia   . GERD (gastroesophageal reflux disease)   . Hearing loss   . History of echocardiogram    Echo 8/18: EF 50-55, normal wall motion, grade 1 diastolic dysfunction, mild MR  . Hypertension   . Memory difficulty 10/25/2017  . Memory loss   . MRSA carrier   . Schizoaffective disorder   . Seizures ([place])    Past Surgical History:  Procedure Laterality Date  . CARPAL TUNNEL RELEASE    . COLONOSCOPY  2010  . ORIF SHOULDER FRACTURE    . self orchectomy    . SHOULDER CLOSED REDUCTION Left 09/16/2013   Procedure: CLOSED REDUCTION SHOULDER;  Surgeon: Mauri Pole, MD;  Location: [facility];  Service: Orthopedics;  Laterality: Left;  . SHOULDER HEMI-ARTHROPLASTY Left 09/18/2013   Procedure: LEFT SHOULDER HEMI-ARTHROPLASTY;  Surgeon: Augustin Schooling, MD;  Location: [facility];  Service: Orthopedics;  Laterality: Left;   Prior to Admission medications   Medication Sig Start Date End Date Taking? Authorizing Provider  ibuprofen (ADVIL,MOTRIN) 200 MG tablet Take 200 mg by mouth every 6 (six) hours as needed for fever or mild pain.   Yes [provider]   amitriptyline (ELAVIL) 100 MG tablet Take 2 tablets (200 mg total) by mouth at bedtime. 05/28/18   Arfeen, Arlyce Harman, MD  amLODipine (NORVASC) 5 MG tablet Take 1 tablet (5 mg total) by mouth daily. 07/26/17   Tonette Bihari, MD  aspirin EC 81 MG tablet Take 1 tablet (81 mg total) by mouth daily. 07/21/15   Withrow, Elyse Jarvis, FNP  atorvastatin (LIPITOR) 40 MG tablet Take 1 tablet (40 mg total) by mouth daily. 04/10/18   Leeanne Rio, MD  clonazePAM (KLONOPIN) 1 MG tablet Take 1 tablet (1 mg total) by mouth 2 (two) times daily. 05/28/18   Arfeen, Arlyce Harman, MD  hydrochlorothiazide (HYDRODIURIL) 25 MG tablet Take 1 tablet (25 mg total) by mouth daily. 07/26/17   Mikell, Jeani Sow, MD  Lacosamide (VIMPAT) 150 MG TABS Take 1 tablet (150 mg total) by mouth 2 (two) times daily. 05/15/18   Ward Givens, NP  losartan (COZAAR) 50 MG tablet TAKE 1 TABLET BY MOUTH EVERY DAY Patient taking differently: Take 50 mg by mouth daily.  03/24/18   Lorretta Harp, MD  metoprolol succinate (TOPROL-XL) 100 MG 24 hr tablet TAKE 2 TABLETS (200 MG TOTAL) DAILY BY MOUTH. 05/14/18   Leeanne Rio, MD  Multiple Vitamin (MULTIVITAMIN WITH MINERALS) TABS tablet Take 1 tablet by mouth daily.    [provider]  phenytoin (  DILANTIN) 100 MG ER capsule Take 1 capsule (100 mg total) by mouth at bedtime. 05/15/18   Ward Givens, NP  phenytoin (PHENYTOIN INFATABS) 50 MG tablet CHEW 1 TABLET BY MOUTH AT BEDTIME Patient taking differently: Chew 50 mg by mouth at bedtime.  05/15/18   Ward Givens, NP  QUEtiapine (SEROQUEL) 300 MG tablet Take 2 tablets (600 mg total) by mouth at bedtime. 05/28/18   Arfeen, Arlyce Harman, MD  testosterone cypionate (DEPOTESTOSTERONE CYPIONATE) 200 MG/ML injection Inject 0.75 mLs (150 mg total) into the muscle every 14 (fourteen) days. 09/12/16   Leeanne Rio, MD   Allergies Allergies  Allergen Reactions  . Codeine Nausea And Vomiting  . Sulfa Antibiotics Nausea And Vomiting  .  Sulfasalazine Nausea And Vomiting    Family History Family History  Problem Relation Age of Onset  . Stroke Father        Living at 38  . Stroke Mother        Stroke in late 85's. Died in her 30's  . Alcohol abuse Brother   . Alcohol abuse Brother   . Diabetes Brother   . Colon cancer Neg Hx   . Esophageal cancer Neg Hx   . Stomach cancer Neg Hx   . Rectal cancer Neg Hx    Social History  reports that he has never smoked. He has never used smokeless tobacco. He reports that he does not drink alcohol or use drugs.  Review Of Systems:  Review of Systems  Constitutional: Positive for fever and malaise/fatigue. Negative for chills.  HENT: Negative.   Eyes: Negative.   Respiratory: Positive for cough and shortness of breath.   Cardiovascular: Negative for palpitations, orthopnea and leg swelling.  Gastrointestinal: Negative.   Genitourinary: Negative.  Negative for dysuria, frequency and urgency.  Musculoskeletal: Negative.   Skin: Negative.   Neurological: Positive for seizures. Negative for focal weakness and headaches.  Endo/Heme/Allergies: Negative.   Psychiatric/Behavioral: Negative.      Brief patient description:    Started having symptoms 2 days ago to come to the hospital, felt things will be better in a couple of days   Events Since Admission: He feels a little bit better since being in hospital Continues to receive fluid resuscitation Hemodynamically is stabilizing  Current Status:  Vital Signs: Temp:  [98.8 F (37.1 C)-103.3 F (39.6 C)] 98.8 F (37.1 C) (08/19 1243) Pulse Rate:  [70-96] 70 (08/19 1245) Resp:  [13-33] 28 (08/19 1245) BP: (72-108)/(51-70) 102/67 (08/19 1245) SpO2:  [94 %-97 %] 97 % (08/19 1245) Weight:  [90.7 kg] 90.7 kg (08/19 0913)  Physical Examination: General: Elderly gentleman, does not appear to be in acute distress Neuro: Awake, alert, oriented x3, moving all extremities with no focality HEENT: No oral lesions, no nasal  lesions Neck: Neck is supple, no JVD no thyromegaly no adenopathy Cardiovascular: S1-S2 appreciated with no murmur Lungs: Clear breath sounds bilaterally Abdomen: Bowel sounds appreciated with no tenderness Musculoskeletal: No joint deformity, no leg swelling Skin: Warm and dry  Active Problems:  Sepsis Hypotension   ASSESSMENT AND PLAN  PULMONARY  He is on oxygen supplementation He does have a cough , shortness of breath  CXR: Chest x-ray reviewed by myself showing no acute infiltrate   A: Possible pneumonia P:   Antibiotics Continue oxygen supplementation  CARDIOVASCULAR Recent Labs  Lab 08/04/18 0932 08/04/18 1138  LATICACIDVEN 3.77* 1.46    Lines: Peripheral IV  A: Hypotension He does not have underlying history of hypertension P:  Hold BP medications Continue fluid resuscitation  RENAL Recent Labs  Lab 08/04/18 0931  NA 138  K 3.6  CL 106  CO2 21*  BUN 35*  CREATININE 1.60*  CALCIUM 8.4*   Intake/Output    None    Closely monitor input and output A: Evidence of chronic kidney disease P:   Continue fluid resuscitation  GASTROINTESTINAL Recent Labs  Lab 08/04/18 0931  AST 156*  ALT 264*  ALKPHOS 245*  BILITOT 0.9  PROT 6.4*  ALBUMIN 3.4*    A: Stable P:   Continue to monitor  HEMATOLOGIC Recent Labs  Lab 08/04/18 0931  HGB 13.9  HCT 39.7  PLT 109*  INR 1.20   A:  Stable Thrombocytopenia P:  Continue to monitor  INFECTIOUS Recent Labs  Lab 08/04/18 0931  WBC 12.9*   Cultures: Follow-up on blood cultures, follow-up in urine cultures Urine does show large leukocyte esterase, positive nitrite Antibiotics: Patient did receive vancomycin and cefepime  A:  Sepsis likely from urinary source, possibility of pneumonia based on his symptomatology P:   We will continue antibiotic coverage at present-continue cefepime, I do not believe we need to continue vancomycin at present  ENDOCRINE No results for input(s):  GLUCAP in the last 168 hours. A: Stable P:   Continue to monitor Monitor sugars  NEUROLOGIC  A: History of seizure disorder Last seizure was 4 years ago P:   Continue to monitor closely  Patient's blood pressure is stabilizing with aggressive fluid resuscitation, lactate is stabilizing, mentating appropriately  We will continue fluids, continue antibiotics, follow cultures Admit to the ICU  We will continue antibiotics at cefepime 2 g every 24 We will not repeat vancomycin, will discontinue Flagyl Continue saline infusion at 125cc/hr Maintain MAP greater than 65  Critical care time spent evaluating patient, reviewing records, plan of care 30 minutes   Ailany Koren Clearence Ped, M.D. Pulmonary and [place] Pager: [phone]  08/04/2018, 1:15 PM

## 2018-08-04 NOTE — ED Provider Notes (Signed)
[facility] DEPT Provider Note   CSN: [id number] Arrival date & time: 08/04/18  0901     History   Chief Complaint Chief Complaint  Patient presents with  . Fatigue  . Shortness of Breath  . Fever    HPI [name] is a 62 y.o. male.  62 year old male presents with 2 days of cough shortness of breath and weakness.  He denies any headache, neck pain, photophobia.  No rashes.  Denies any vomiting or diarrhea.  No abdominal discomfort.  Denies any dysuria or hematuria.  To EMS, his wife states that he has not been taking care of himself.  Did complain of nausea to EMS was given Zofran.  Patient found to be short of breath and tachypneic had to the touch.  She does endorse dyspnea but denies any anginal or CHF symptoms.  Had a temp of 102.  Given ibuprofen and Tylenol as well as 500 cc bolus of saline and transported here.     Past Medical History:  Diagnosis Date  . Anxiety   . Constipation   . Depression   . Dyslipidemia   . GERD (gastroesophageal reflux disease)   . Hearing loss   . History of echocardiogram    Echo 8/18: EF 50-55, normal wall motion, grade 1 diastolic dysfunction, mild MR  . Hypertension   . Memory difficulty 10/25/2017  . Memory loss   . MRSA carrier   . Schizoaffective disorder   . Seizures [facility])     Patient Active Problem List   Diagnosis Date Noted  . Unintentional weight loss 04/07/2018  . Syncope and collapse 12/13/2017  . Memory difficulty 10/25/2017  . Presbyacusia 07/26/2017  . Palpitations 07/01/2017  . Acute bronchiolitis due to human metapneumovirus   . Mild sleep apnea 06/15/2016  . Actinic keratosis 06/15/2016  . Nocturnal dyspnea 02/03/2016  . Skin lesion of face 02/03/2016  . Generalized weakness 01/17/2016  . Paresthesia of both hands 09/16/2015  . Phenytoin toxicity   . TIA (transient ischemic attack) 07/29/2015  . Vertigo 07/29/2015  . Schizoaffective disorder, depressive type with good  prognostic features ([place]) 07/13/2015  . Constipation   . Verruca 05/18/2015  . Seborrheic dermatitis of scalp 05/18/2015  . MDD (major depressive disorder), recurrent severe, without psychosis ([place])   . OCD (obsessive compulsive disorder)   . Sepsis ([place]) 01/02/2015  . Confusion 12/31/2014  . Seizures ([place])   . Schizoaffective disorder, depressive type ([place])   . Orthostatic hypotension   . Fall   . Seizure ([place]) 11/18/2014  . Schizoaffective disorder, unspecified type ([place])   . Essential hypertension   . Overdose of beta-adrenergic antagonist drug 10/22/2014  . Overdose   . Nocturnal enuresis 03/23/2014  . Benzodiazepine dependence, continuous ([place]) 01/27/2014  . Severe major depression ([place]) 01/21/2014  . Frozen shoulder syndrome 09/20/2013  . Somnolence 09/20/2013  . Schizoaffective disorder ([place]) 09/20/2013  . Malaise and fatigue 04/21/2013  . Presbyopia 04/25/2012  . Androgen deficiency 09/07/2011  . Constipation, chronic 09/07/2011  . Other specified disorders of liver 12/30/2008  . Overweight(278.02) 09/08/2008  . Hyperlipidemia 01/28/2008  . Seizure disorder ([place]) 01/28/2008  . OSTEOPENIA 07/01/2007    Past Surgical History:  Procedure Laterality Date  . CARPAL TUNNEL RELEASE    . COLONOSCOPY  2010  . ORIF SHOULDER FRACTURE    . self orchectomy    . SHOULDER CLOSED REDUCTION Left 09/16/2013   Procedure: CLOSED REDUCTION SHOULDER;  Surgeon: Mauri Pole, MD;  Location:  [facility];  Service: Orthopedics;  Laterality: Left;  . SHOULDER HEMI-ARTHROPLASTY Left 09/18/2013   Procedure: LEFT SHOULDER HEMI-ARTHROPLASTY;  Surgeon: Augustin Schooling, MD;  Location: [facility];  Service: Orthopedics;  Laterality: Left;        Home Medications    Prior to Admission medications   Medication Sig Start Date End Date Taking? Authorizing Provider  amitriptyline (ELAVIL) 100 MG tablet Take 2 tablets (200 mg total) by mouth at bedtime. 05/28/18   Arfeen, Arlyce Harman, MD  amLODipine (NORVASC)  5 MG tablet Take 1 tablet (5 mg total) by mouth daily. 07/26/17   Tonette Bihari, MD  aspirin EC 81 MG tablet Take 1 tablet (81 mg total) by mouth daily. 07/21/15   Withrow, Elyse Jarvis, FNP  atorvastatin (LIPITOR) 40 MG tablet Take 1 tablet (40 mg total) by mouth daily. 04/10/18   Leeanne Rio, MD  clonazePAM (KLONOPIN) 1 MG tablet Take 1 tablet (1 mg total) by mouth 2 (two) times daily. 05/28/18   Arfeen, Arlyce Harman, MD  hydrochlorothiazide (HYDRODIURIL) 25 MG tablet Take 1 tablet (25 mg total) by mouth daily. 07/26/17   Mikell, Jeani Sow, MD  Lacosamide (VIMPAT) 150 MG TABS Take 1 tablet (150 mg total) by mouth 2 (two) times daily. 05/15/18   Ward Givens, NP  losartan (COZAAR) 50 MG tablet TAKE 1 TABLET BY MOUTH EVERY DAY 03/24/18   Lorretta Harp, MD  metoprolol succinate (TOPROL-XL) 100 MG 24 hr tablet TAKE 2 TABLETS (200 MG TOTAL) DAILY BY MOUTH. 05/14/18   Leeanne Rio, MD  Multiple Vitamin (MULTIVITAMIN WITH MINERALS) TABS tablet Take 1 tablet by mouth daily.    [provider]  phenytoin (DILANTIN) 100 MG ER capsule Take 1 capsule (100 mg total) by mouth at bedtime. 05/15/18   Ward Givens, NP  phenytoin (PHENYTOIN INFATABS) 50 MG tablet CHEW 1 TABLET BY MOUTH AT BEDTIME 05/15/18   Ward Givens, NP  QUEtiapine (SEROQUEL) 300 MG tablet Take 2 tablets (600 mg total) by mouth at bedtime. 05/28/18   Arfeen, Arlyce Harman, MD  testosterone cypionate (DEPOTESTOSTERONE CYPIONATE) 200 MG/ML injection Inject 0.75 mLs (150 mg total) into the muscle every 14 (fourteen) days. 09/12/16   Leeanne Rio, MD    Family History Family History  Problem Relation Age of Onset  . Stroke Father        Living at 66  . Stroke Mother        Stroke in late 86's. Died in her 53's  . Alcohol abuse Brother   . Alcohol abuse Brother   . Diabetes Brother   . Colon cancer Neg Hx   . Esophageal cancer Neg Hx   . Stomach cancer Neg Hx   . Rectal cancer Neg Hx     Social History Social  History   Tobacco Use  . Smoking status: Never Smoker  . Smokeless tobacco: Never Used  Substance Use Topics  . Alcohol use: No    Alcohol/week: 0.0 standard drinks  . Drug use: No     Allergies   Codeine; Sulfa antibiotics; and Sulfasalazine   Review of Systems Review of Systems  All other systems reviewed and are negative.    Physical Exam Updated Vital Signs BP (!) 108/58 (BP Location: Left Arm)   Pulse 96   Temp (!) 103.3 F (39.6 C) (Rectal)   Resp 13   Ht 1.803 m (5\' 11" )   Wt 90.7 kg   SpO2 97%   BMI 27.89 kg/m  Physical Exam  Constitutional: He is oriented to person, place, and time. He appears well-developed and well-nourished.  Non-toxic appearance. No distress.  HENT:  Head: Normocephalic and atraumatic.  Eyes: Pupils are equal, round, and reactive to light. Conjunctivae, EOM and lids are normal.  Neck: Normal range of motion. Neck supple. No tracheal deviation present. No thyroid mass present.  Cardiovascular: Normal rate, regular rhythm and normal heart sounds. Exam reveals no gallop.  No murmur heard. Pulmonary/Chest: Effort normal. No stridor. No respiratory distress. He has decreased breath sounds in the right lower field and the left lower field. He has rales in the right lower field and the left lower field.  Abdominal: Soft. Normal appearance and bowel sounds are normal. He exhibits no distension. There is no tenderness. There is no rebound and no CVA tenderness.  Musculoskeletal: Normal range of motion. He exhibits no edema or tenderness.  Neurological: He is alert and oriented to person, place, and time. He has normal strength. No cranial nerve deficit or sensory deficit. GCS eye subscore is 4. GCS verbal subscore is 5. GCS motor subscore is 6.  Skin: Skin is warm and dry. No abrasion and no rash noted.  Psychiatric: His affect is blunt. His speech is delayed. He is slowed.  Nursing note and vitals reviewed.    ED Treatments / Results   Labs (all labs ordered are listed, but only abnormal results are displayed) Labs Reviewed  I-STAT CG4 LACTIC ACID, ED - Abnormal; Notable for the following components:      Result Value   Lactic Acid, Venous 3.77 (*)    All other components within normal limits  CULTURE, BLOOD (ROUTINE X 2)  CULTURE, BLOOD (ROUTINE X 2)  COMPREHENSIVE METABOLIC PANEL  CBC WITH DIFFERENTIAL/PLATELET  PROTIME-INR  URINALYSIS, ROUTINE W REFLEX MICROSCOPIC  PHENYTOIN LEVEL, TOTAL  I-STAT CG4 LACTIC ACID, ED    EKG None  Radiology No results found.  Procedures Procedures (including critical care time)  Medications Ordered in ED Medications  sodium chloride 0.9 % bolus 1,000 mL (has no administration in time range)    And  sodium chloride 0.9 % bolus 1,000 mL (has no administration in time range)    And  sodium chloride 0.9 % bolus 1,000 mL (has no administration in time range)  0.9 %  sodium chloride infusion (has no administration in time range)  ceFEPIme (MAXIPIME) 2 g in sodium chloride 0.9 % 100 mL IVPB (has no administration in time range)  metroNIDAZOLE (FLAGYL) IVPB 500 mg (has no administration in time range)  vancomycin (VANCOCIN) IVPB 1000 mg/200 mL premix (has no administration in time range)     Initial Impression / Assessment and Plan / ED Course  I have reviewed the triage vital signs and the nursing notes.  Pertinent labs & imaging results that were available during my care of the patient were reviewed by me and considered in my medical decision making (see chart for details).     Code sepsis initiated here after my evaluation.  IV saline boluses ordered as well as empiric antibiotics started.  10:23 AM Patient continues to receive IV fluids for his hypotension.  Patient's lactate noted.  Evidence of acute kidney injury noted.  Patient also has a transaminitis likely from his current process.  Labs are pending at this time and case signed out to [place] Performed by: Leota Jacobsen Total critical care time: 45 minutes Critical care time was exclusive of separately billable procedures and  treating other patients. Critical care was necessary to treat or prevent imminent or life-threatening deterioration. Critical care was time spent personally by me on the following activities: development of treatment plan with patient and/or surrogate as well as nursing, discussions with consultants, evaluation of patient's response to treatment, examination of patient, obtaining history from patient or surrogate, ordering and performing treatments and interventions, ordering and review of laboratory studies, ordering and review of radiographic studies, pulse oximetry and re-evaluation of patient's condition.   Final Clinical Impressions(s) / ED Diagnoses   Final diagnoses:  None    ED Discharge Orders    None       Lacretia Leigh, MD 08/04/18 1024

## 2018-08-04 NOTE — ED Notes (Signed)
Bed: WA17 Expected date:  Expected time:  Means of arrival:  Comments: EMS- sepsis

## 2018-08-04 NOTE — ED Triage Notes (Signed)
Patient c/o SOB, tachypneic , shallow respirations. Per EMS, patient hot to touch, oral temp 102F. Patient took ibuprofen 3 hours ago, 1g tylenol given by EMS. Denis any pain.  556ml bolus given. 20g right hand.   BP: 122/62 HR: 98 RR: 26 O2:  98 on 4LNC   Hx HTN, HLD,

## 2018-08-04 NOTE — Progress Notes (Signed)
Pharmacy Note   A consult was received from an ED physician for vancomycin and cefepime per pharmacy dosing.    The patient's profile has been reviewed for ht/wt/allergies/indication/available labs.    A one time order has been placed for vancomycin 1000 mg IV x1 and cefepime 2 gr IV x1 .  Further antibiotics/pharmacy consults should be ordered by admitting physician if indicated.                       Thank you,  Royetta Asal, PharmD, BCPS Pager [phone] 08/04/2018 9:49 AM

## 2018-08-04 NOTE — ED Notes (Signed)
ED TO INPATIENT HANDOFF REPORT  Name/Age/Gender [name] 62 y.o. male  Code Status    Code Status Orders  (From admission, onward)         Start     Ordered   08/04/18 1400  Full code  Continuous     08/04/18 1402        Code Status History    Date Active Date Inactive Code Status Order ID Comments User Context   12/06/2016 0935 12/09/2016 1700 Full Code [id number]  Elwin Mocha, MD ED   07/29/2015 1823 08/02/2015 2011 DNR [id number]  Leone Brand, MD Inpatient   07/13/2015 0540 07/21/2015 1940 Full Code [id number]  Laverle Hobby, PA-C Inpatient   06/02/2015 0136 06/03/2015 1813 Full Code [id number]  Frazier Richards, MD Inpatient   02/16/2015 0303 02/17/2015 2021 Full Code [id number]  Frazier Richards, MD Inpatient   01/03/2015 0035 01/06/2015 1719 Full Code [id number]  Leeanne Rio, MD Inpatient   12/31/2014 0218 12/31/2014 2025 Full Code [id number]  Katheren Shams, DO Inpatient   11/19/2014 0122 11/22/2014 2343 Full Code [id number]  Dimas Chyle, MD Inpatient   10/20/2014 1850 10/25/2014 2127 Full Code [id number]  Germain Osgood, PA-C ED   05/08/2014 1604 05/18/2014 0117 Full Code [id number]  Elmarie Shiley, NP Inpatient   05/08/2014 0123 05/08/2014 1507 Full Code [id number]  Johnna Acosta, MD ED   01/21/2014 1739 01/27/2014 1851 Full Code [id number]  Clarene Reamer, MD Inpatient   01/20/2014 2129 01/21/2014 1739 Full Code [id number]  Renold Genta, PA-C ED   09/18/2013 2057 09/22/2013 1730 Full Code [id number]  Augustin Schooling, MD Inpatient   09/17/2013 1656 09/18/2013 2057 Full Code [id number]  Ventura Bruns, PA-C Inpatient   09/16/2013 2318 09/17/2013 1656 Full Code [id number]  Mauri Pole, MD Inpatient   08/11/2012 0246 08/11/2012 1826 Full Code [id number]  McGill, Edison Pace, MD Inpatient      Home/SNF/Other Home  Chief Complaint fever;shob  Level of Care/Admitting Diagnosis ED Disposition    ED Disposition Condition [facility] Area: [facility]  [[id number]]  Level of Care: ICU [6]  Diagnosis: Sepsis [facility]) [[id number]]  Admitting Physician: Laurin Coder [[id number]]  Attending Physician: Laurin Coder [[id number]]  Estimated length of stay: past midnight tomorrow  Certification:: I certify this patient will need inpatient services for at least 2 midnights  PT Class (Do Not Modify): Inpatient [101]  PT Acc Code (Do Not Modify): Private [1]       Medical History Past Medical History:  Diagnosis Date  . Anxiety   . Constipation   . Depression   . Dyslipidemia   . GERD (gastroesophageal reflux disease)   . Hearing loss   . History of echocardiogram    Echo 8/18: EF 50-55, normal wall motion, grade 1 diastolic dysfunction, mild MR  . Hypertension   . Memory difficulty 10/25/2017  . Memory loss   . MRSA carrier   . Schizoaffective disorder   . Seizures ([facility])     Allergies Allergies  Allergen Reactions  . Codeine Nausea And Vomiting  . Sulfa Antibiotics Nausea And Vomiting  . Sulfasalazine Nausea And Vomiting    IV Location/Drains/Wounds Patient Lines/Drains/Airways Status   Active Line/Drains/Airways    Name:   Placement date:   Placement time:   Site:   Days:   Peripheral IV 08/04/18 Right Hand   08/04/18    0939    Hand  less than 1   Peripheral IV 08/04/18 Left Wrist   08/04/18    0939    Wrist   less than 1   Peripheral IV 08/04/18 Right Antecubital   08/04/18    0939    Antecubital   less than 1          Labs/Imaging Results for orders placed or performed during the hospital encounter of 08/04/18 (from the past 48 hour(s))  Comprehensive metabolic panel     Status: Abnormal   Collection Time: 08/04/18  9:31 AM  Result Value Ref Range   Sodium 138 135 - 145 mmol/L   Potassium 3.6 3.5 - 5.1 mmol/L   Chloride 106 98 - 111 mmol/L   CO2 21 (L) 22 - 32 mmol/L   Glucose, Bld 162 (H) 70 - 99 mg/dL   BUN 35 (H) 8 - 23 mg/dL   Creatinine, Ser 1.60 (H) 0.61 - 1.24 mg/dL   Calcium 8.4 (L) 8.9 - 10.3 mg/dL    Total Protein 6.4 (L) 6.5 - 8.1 g/dL   Albumin 3.4 (L) 3.5 - 5.0 g/dL   AST 156 (H) 15 - 41 U/L   ALT 264 (H) 0 - 44 U/L   Alkaline Phosphatase 245 (H) 38 - 126 U/L   Total Bilirubin 0.9 0.3 - 1.2 mg/dL   GFR calc non Af Amer 44 (L) >60 mL/min   GFR calc Af Amer 51 (L) >60 mL/min    Comment: (NOTE) The eGFR has been calculated using the CKD EPI equation. This calculation has not been validated in all clinical situations. eGFR's persistently <60 mL/min signify possible Chronic Kidney Disease.    Anion gap 11 5 - 15    Comment: Performed at [facility], [place] [address], [place], [place] [address]  CBC with Differential     Status: Abnormal   Collection Time: 08/04/18  9:31 AM  Result Value Ref Range   WBC 12.9 (H) 4.0 - 10.5 K/uL   RBC 4.19 (L) 4.22 - 5.81 MIL/uL   Hemoglobin 13.9 13.0 - 17.0 g/dL   HCT 39.7 39.0 - 52.0 %   MCV 94.7 78.0 - 100.0 fL   MCH 33.2 26.0 - 34.0 pg   MCHC 35.0 30.0 - 36.0 g/dL   RDW 13.9 11.5 - 15.5 %   Platelets 109 (L) 150 - 400 K/uL    Comment: REPEATED TO VERIFY SPECIMEN CHECKED FOR CLOTS PLATELET COUNT CONFIRMED BY SMEAR    Neutrophils Relative % 98 %   Neutro Abs 12.6 1.7 - 7.7 K/uL   Lymphocytes Relative 2 %   Lymphs Abs 0.2 (L) 0.7 - 4.0 K/uL   Monocytes Relative 0 %   Monocytes Absolute 0.1 0.1 - 1.0 K/uL   Eosinophils Relative 0 %   Eosinophils Absolute 0.0 0.0 - 0.7 K/uL   Basophils Relative 0 %   Basophils Absolute 0.0 0.0 - 0.1 K/uL   WBC Morphology MILD LEFT SHIFT (1-5% METAS, OCC MYELO, OCC BANDS)     Comment: Performed at [facility], [place] [address], [place], [place] [address]  Protime-INR     Status: None   Collection Time: 08/04/18  9:31 AM  Result Value Ref Range   Prothrombin Time 15.1 11.4 - 15.2 seconds   INR 1.20     Comment: Performed at [facility], [place] [address] [place], [place] [address]  I-Stat CG4 Lactic Acid, ED     Status: Abnormal   Collection Time:  08/04/18  9:32 AM  Result Value Ref Range   Lactic Acid, Venous 3.77 (HH) 0.5 - 1.9 mmol/L   Comment NOTIFIED PHYSICIAN   Phenytoin level, total     Status: Abnormal   Collection Time: 08/04/18 10:05 AM  Result Value Ref Range   Phenytoin Lvl <2.5 (L) 10.0 - 20.0 ug/mL    Comment: Performed at [facility], [place] [address], [place], [place] [address]  I-Stat CG4 Lactic Acid, ED  (not at  [facility])     Status: None   Collection Time: 08/04/18 11:38 AM  Result Value Ref Range   Lactic Acid, Venous 1.46 0.5 - 1.9 mmol/L  Urinalysis, Routine w reflex microscopic     Status: Abnormal   Collection Time: 08/04/18 12:11 PM  Result Value Ref Range   Color, Urine YELLOW YELLOW   APPearance HAZY (A) CLEAR   Specific Gravity, Urine 1.012 1.005 - 1.030   pH 5.0 5.0 - 8.0   Glucose, UA NEGATIVE NEGATIVE mg/dL   Hgb urine dipstick LARGE (A) NEGATIVE   Bilirubin Urine NEGATIVE NEGATIVE   Ketones, ur NEGATIVE NEGATIVE mg/dL   Protein, ur 30 (A) NEGATIVE mg/dL   Nitrite POSITIVE (A) NEGATIVE   Leukocytes, UA LARGE (A) NEGATIVE   RBC / HPF >50 (H) 0 - 5 RBC/hpf   WBC, UA >50 (H) 0 - 5 WBC/hpf   Bacteria, UA RARE (A) NONE SEEN   Squamous Epithelial / LPF 0-5 0 - 5    Comment: Performed at [facility], [place] [address], [place], [place] [address]   Dg Chest 2 View  Result Date: 08/04/2018 CLINICAL DATA:  Shortness of breath, tachypnea, fever EXAM: CHEST - 2 VIEW COMPARISON:  Chest x-ray of December 07, 2016 FINDINGS: The lungs are mildly hypoinflated. The interstitial markings are coarse. The perihilar lung markings are increased. The heart is normal in size. The pulmonary vascularity is not clearly engorged. There is no pleural effusion or pneumothorax. IMPRESSION: Mild hypoinflation. No alveolar pneumonia. Coarse interstitial lung markings may reflect interstitial pneumonia in the appropriate clinical setting. No definite CHF. Electronically Signed   By: David  Martinique  M.D.   On: 08/04/2018 09:49    Pending Labs Unresulted Labs (From admission, onward)    Start     Ordered   08/11/18 0500  Creatinine, serum  (enoxaparin (LOVENOX)    CrCl >/= 30 ml/min)  Weekly,   R    Comments:  while on enoxaparin therapy    08/04/18 1402   08/05/18 0500  CBC  Tomorrow morning,   R     08/04/18 1402   08/05/18 3790  Basic metabolic panel  Tomorrow morning,   R     08/04/18 1402   08/05/18 0500  Phosphorus  Tomorrow morning,   R     08/04/18 1402   08/05/18 0500  Magnesium  Tomorrow morning,   R     08/04/18 1402   08/04/18 1401  Legionella Pneumophila Serogp 1 Ur Ag  Once,   R     08/04/18 1402   08/04/18 1401  Strep pneumoniae urinary antigen  (not at [facility])  Once,   R     08/04/18 1402   08/04/18 1359  CBC  (enoxaparin (LOVENOX)    CrCl >/= 30 ml/min)  Once,   R    Comments:  Baseline for enoxaparin therapy IF NOT ALREADY DRAWN.  Notify MD if PLT < 100 K.    08/04/18 1402  08/04/18 1359  Creatinine, serum  (enoxaparin (LOVENOX)    CrCl >/= 30 ml/min)  Once,   R    Comments:  Baseline for enoxaparin therapy IF NOT ALREADY DRAWN.    08/04/18 1402   08/04/18 1044  Urine culture  STAT,   STAT     08/04/18 1043   08/04/18 0917  Culture, blood (Routine x 2)  BLOOD CULTURE X 2,   STAT     08/04/18 0917          Vitals/Pain Today's Vitals   08/04/18 1330 08/04/18 1400 08/04/18 1430 08/04/18 1500  BP: 104/77 101/70 104/73 111/89  Pulse: 69 66 65 64  Resp: 16 (!) 25 20 15   Temp:      TempSrc:      SpO2: 97% 97% 96% 96%  Weight:      Height:      PainSc:        Isolation Precautions No active isolations  Medications Medications  0.9 %  sodium chloride infusion (1,000 mLs Intravenous New Bag/Given 08/04/18 1051)  0.9 %  sodium chloride infusion (has no administration in time range)  enoxaparin (LOVENOX) injection 40 mg (has no administration in time range)  0.9 %  sodium chloride infusion (has no administration in time range)  acetaminophen  (TYLENOL) tablet 650 mg (has no administration in time range)  ondansetron (ZOFRAN) injection 4 mg (has no administration in time range)  QUEtiapine (SEROQUEL) tablet 600 mg (has no administration in time range)  phenytoin (DILANTIN) chewable tablet 50 mg (has no administration in time range)  testosterone cypionate (DEPOTESTOSTERONE CYPIONATE) injection 150 mg (has no administration in time range)  phenytoin (DILANTIN) ER capsule 100 mg (has no administration in time range)  multivitamin with minerals tablet 1 tablet (has no administration in time range)  Lacosamide TABS 150 mg (has no administration in time range)  clonazePAM (KLONOPIN) tablet 1 mg (has no administration in time range)  atorvastatin (LIPITOR) tablet 40 mg (has no administration in time range)  amitriptyline (ELAVIL) tablet 200 mg (has no administration in time range)  ceFEPIme (MAXIPIME) 2 g in sodium chloride 0.9 % 100 mL IVPB (has no administration in time range)  sodium chloride 0.9 % bolus 1,000 mL (0 mLs Intravenous Stopped 08/04/18 1051)    And  sodium chloride 0.9 % bolus 1,000 mL (0 mLs Intravenous Stopped 08/04/18 1130)    And  sodium chloride 0.9 % bolus 1,000 mL (0 mLs Intravenous Stopped 08/04/18 1202)  ceFEPIme (MAXIPIME) 2 g in sodium chloride 0.9 % 100 mL IVPB (0 g Intravenous Stopped 08/04/18 1023)  vancomycin (VANCOCIN) IVPB 1000 mg/200 mL premix (0 mg Intravenous Stopped 08/04/18 1203)    Mobility walks with person assist

## 2018-08-05 ENCOUNTER — Inpatient Hospital Stay (HOSPITAL_COMMUNITY): Payer: Medicaid Other

## 2018-08-05 LAB — CBC
HCT: 36.9 % — ABNORMAL LOW (ref 39.0–52.0)
Hemoglobin: 13 g/dL (ref 13.0–17.0)
MCH: 33.5 pg (ref 26.0–34.0)
MCHC: 35.2 g/dL (ref 30.0–36.0)
MCV: 95.1 fL (ref 78.0–100.0)
Platelets: 93 10*3/uL — ABNORMAL LOW (ref 150–400)
RBC: 3.88 MIL/uL — ABNORMAL LOW (ref 4.22–5.81)
RDW: 14.3 % (ref 11.5–15.5)
WBC: 15.5 10*3/uL — ABNORMAL HIGH (ref 4.0–10.5)

## 2018-08-05 LAB — BASIC METABOLIC PANEL
Anion gap: 5 (ref 5–15)
BUN: 21 mg/dL (ref 8–23)
CO2: 21 mmol/L — ABNORMAL LOW (ref 22–32)
Calcium: 7.9 mg/dL — ABNORMAL LOW (ref 8.9–10.3)
Chloride: 114 mmol/L — ABNORMAL HIGH (ref 98–111)
Creatinine, Ser: 0.98 mg/dL (ref 0.61–1.24)
GFR calc Af Amer: >60 mL/min (ref >60)
GFR calc non Af Amer: >60 mL/min (ref >60)
Glucose, Bld: 141 mg/dL — ABNORMAL HIGH (ref 70–99)
Potassium: 3.8 mmol/L (ref 3.5–5.1)
Sodium: 140 mmol/L (ref 135–145)

## 2018-08-05 LAB — MAGNESIUM: Magnesium: 2.3 mg/dL (ref 1.7–2.4)

## 2018-08-05 LAB — PHOSPHORUS: Phosphorus: 1.8 mg/dL — ABNORMAL LOW (ref 2.5–4.6)

## 2018-08-05 MED ORDER — TESTOSTERONE CYPIONATE 200 MG/ML IM SOLN: 150.0000 mg | INTRAMUSCULAR | Status: DC

## 2018-08-05 MED ORDER — AZITHROMYCIN 250 MG PO TABS
250.0000 mg | ORAL_TABLET | Freq: Every day | ORAL | Status: DC
  Administered 2018-08-06: 250 mg via ORAL
  Filled 2018-08-05: qty 1

## 2018-08-05 MED ORDER — AZITHROMYCIN 250 MG PO TABS
500.0000 mg | ORAL_TABLET | Freq: Every day | ORAL | Status: AC
  Administered 2018-08-05: 500 mg via ORAL
  Filled 2018-08-05: qty 2

## 2018-08-05 MED ORDER — ALBUTEROL SULFATE (2.5 MG/3ML) 0.083% IN NEBU: 2.5000 mg | INHALATION_SOLUTION | Freq: Four times a day (QID) | RESPIRATORY_TRACT | Status: DC | PRN

## 2018-08-05 MED ORDER — SODIUM CHLORIDE 0.9 % IV SOLN
2.0000 g | Freq: Three times a day (TID) | INTRAVENOUS | Status: DC
  Administered 2018-08-05 – 2018-08-06 (×4): 2 g via INTRAVENOUS
  Filled 2018-08-05 (×4): qty 2

## 2018-08-05 MED ORDER — PROMETHAZINE HCL 25 MG/ML IJ SOLN
12.5000 mg | Freq: Four times a day (QID) | INTRAMUSCULAR | Status: DC | PRN
  Administered 2018-08-05 – 2018-08-06 (×3): 12.5 mg via INTRAVENOUS
  Filled 2018-08-05 (×4): qty 1

## 2018-08-05 MED ORDER — ALBUTEROL SULFATE (2.5 MG/3ML) 0.083% IN NEBU
2.5000 mg | INHALATION_SOLUTION | Freq: Once | RESPIRATORY_TRACT | Status: AC
  Administered 2018-08-05: 2.5 mg via RESPIRATORY_TRACT
  Filled 2018-08-05: qty 3

## 2018-08-05 MED ORDER — ORAL CARE MOUTH RINSE
15.0000 mL | Freq: Two times a day (BID) | OROMUCOSAL | Status: DC
  Administered 2018-08-05 – 2018-08-07 (×4): 15 mL via OROMUCOSAL

## 2018-08-05 NOTE — Progress Notes (Signed)
PHARMACY NOTE:  ANTIMICROBIAL RENAL DOSAGE ADJUSTMENT  Current antimicrobial regimen includes a mismatch between antimicrobial dosage and estimated renal function.  As per policy approved by the Pharmacy & Therapeutics and Medical Executive Committees, the antimicrobial dosage will be adjusted accordingly.  Current antimicrobial dosage:  Cefepime 2gm IV q12h  Indication: suspected PNA, UTI  Renal Function:  Estimated Creatinine Clearance: 87.9 mL/min (by C-G formula based on SCr of 0.98 mg/dL). []      On intermittent HD, scheduled: []      On CRRT   Antimicrobial dosage has been changed to:  Cefepime 2gm IV q8h   Thank you for allowing pharmacy to be a part of this patient's care.  Lynelle Doctor, [facility] 08/05/2018 8:42 AM

## 2018-08-05 NOTE — Progress Notes (Signed)
PULMONARY / CRITICAL CARE MEDICINE   Name: [name] MRN: [id number] DOB: Mar 19, 1956    ADMISSION DATE:  08/04/2018 CONSULTATION DATE: 08/04/2018   CHIEF COMPLAINT:  .  Fatigue .  Shortness of breath .  Fever  HISTORY OF PRESENT ILLNESS:   Patient came in with a 2-day history of shortness of breath, cough, just feeling generally under the weather Denies any nausea no vomiting No abdominal pain or discomfort Denies any dysuria  PAST MEDICAL HISTORY :  He  has a past medical history of Anxiety, Constipation, Depression, Dyslipidemia, GERD (gastroesophageal reflux disease), Hearing loss, History of echocardiogram, Hypertension, Memory difficulty (10/25/2017), Memory loss, MRSA carrier, Schizoaffective disorder, and Seizures ([place]).  PAST SURGICAL HISTORY: He  has a past surgical history that includes self orchectomy; Colonoscopy (2010); ORIF shoulder fracture; Shoulder Closed Reduction (Left, 09/16/2013); Shoulder hemi-arthroplasty (Left, 09/18/2013); and Carpal tunnel release.  Allergies  Allergen Reactions  . Codeine Nausea And Vomiting  . Sulfa Antibiotics Nausea And Vomiting  . Sulfasalazine Nausea And Vomiting    No current facility-administered medications on file prior to encounter.    Current Outpatient Medications on File Prior to Encounter  Medication Sig  . ibuprofen (ADVIL,MOTRIN) 200 MG tablet Take 200 mg by mouth every 6 (six) hours as needed for fever or mild pain.  Marland Kitchen amitriptyline (ELAVIL) 100 MG tablet Take 2 tablets (200 mg total) by mouth at bedtime.  Marland Kitchen amLODipine (NORVASC) 5 MG tablet Take 1 tablet (5 mg total) by mouth daily.  Marland Kitchen aspirin EC 81 MG tablet Take 1 tablet (81 mg total) by mouth daily.  Marland Kitchen atorvastatin (LIPITOR) 40 MG tablet Take 1 tablet (40 mg total) by mouth daily.  . clonazePAM (KLONOPIN) 1 MG tablet Take 1 tablet (1 mg total) by mouth 2 (two) times daily.  . hydrochlorothiazide (HYDRODIURIL) 25 MG tablet Take 1 tablet (25 mg total) by mouth  daily.  . Lacosamide (VIMPAT) 150 MG TABS Take 1 tablet (150 mg total) by mouth 2 (two) times daily.  Marland Kitchen losartan (COZAAR) 50 MG tablet TAKE 1 TABLET BY MOUTH EVERY DAY (Patient taking differently: Take 50 mg by mouth daily. )  . metoprolol succinate (TOPROL-XL) 100 MG 24 hr tablet TAKE 2 TABLETS (200 MG TOTAL) DAILY BY MOUTH.  . Multiple Vitamin (MULTIVITAMIN WITH MINERALS) TABS tablet Take 1 tablet by mouth daily.  . phenytoin (DILANTIN) 100 MG ER capsule Take 1 capsule (100 mg total) by mouth at bedtime.  . phenytoin (PHENYTOIN INFATABS) 50 MG tablet CHEW 1 TABLET BY MOUTH AT BEDTIME (Patient taking differently: Chew 50 mg by mouth at bedtime. )  . QUEtiapine (SEROQUEL) 300 MG tablet Take 2 tablets (600 mg total) by mouth at bedtime.  Marland Kitchen testosterone cypionate (DEPOTESTOSTERONE CYPIONATE) 200 MG/ML injection Inject 0.75 mLs (150 mg total) into the muscle every 14 (fourteen) days.    FAMILY HISTORY:  His family history includes Alcohol abuse in his brother and brother; Diabetes in his brother; Stroke in his father and mother. There is no history of Colon cancer, Esophageal cancer, Stomach cancer, or Rectal cancer.  SOCIAL HISTORY: He  reports that he has never smoked. He has never used smokeless tobacco. He reports that he does not drink alcohol or use drugs.  REVIEW OF SYSTEMS:   Review of Systems  Constitutional: Positive for malaise/fatigue. Negative for fever.  HENT: Negative.   Eyes: Negative.   Respiratory: Positive for cough. Negative for sputum production, shortness of breath and wheezing.   Cardiovascular: Negative for chest  pain, palpitations, orthopnea and leg swelling.  Gastrointestinal: Positive for nausea.  Genitourinary: Negative.   Musculoskeletal: Positive for myalgias.  Skin: Negative.   Neurological: Negative.   Endo/Heme/Allergies: Negative.   Psychiatric/Behavioral: Negative.      SUBJECTIVE:  He feels a little bit better Has nausea Has muscle aches Feels  generally very weak Continues to deny dysuria, no chest pains or chest discomfort  VITAL SIGNS: BP (!) 130/98 (BP Location: Left Arm)   Pulse 81   Temp 98.2 F (36.8 C) (Oral)   Resp (!) 37   Ht 5\' 11"  (1.803 m)   Wt 91 kg   SpO2 94%   BMI 27.98 kg/m   HEMODYNAMICS:   Blood pressure is better  INTAKE / OUTPUT: I/O last 3 completed shifts: In: 2580.1 [P.O.:360; I.V.:2035; IV Piggyback:185.1] Out: 2100 [Urine:2100]  PHYSICAL EXAMINATION: General: Elderly gentleman, does not appear to be in distress, on oxygen supplementation Neuro: Awake, alert, oriented x3, moving all extremities HEENT: No oral lesions, no nasal lesions Cardiovascular: S1-S2 appreciated, no murmurs Lungs: Clear breath sounds bilaterally, no rales, no rhonchi Abdomen: Soft, bowel sounds appreciated Musculoskeletal: No edema Skin: Warm and dry  LABS:  BMET Recent Labs  Lab 08/04/18 0931 08/05/18 0351  NA 138 140  K 3.6 3.8  CL 106 114*  CO2 21* 21*  BUN 35* 21  CREATININE 1.60* 0.98  GLUCOSE 162* 141*    Electrolytes Recent Labs  Lab 08/04/18 0931 08/05/18 0351  CALCIUM 8.4* 7.9*  MG  --  2.3  PHOS  --  1.8*    CBC Recent Labs  Lab 08/04/18 0931 08/05/18 0351  WBC 12.9* 15.5*  HGB 13.9 13.0  HCT 39.7 36.9*  PLT 109* 93*    Coag's Recent Labs  Lab 08/04/18 0931  INR 1.20    Sepsis Markers Recent Labs  Lab 08/04/18 0932 08/04/18 1138  LATICACIDVEN 3.77* 1.46    ABG No results for input(s): PHART, PCO2ART, PO2ART in the last 168 hours.  Liver Enzymes Recent Labs  Lab 08/04/18 0931  AST 156*  ALT 264*  ALKPHOS 245*  BILITOT 0.9  ALBUMIN 3.4*    Cardiac Enzymes No results for input(s): TROPONINI, PROBNP in the last 168 hours.  Glucose No results for input(s): GLUCAP in the last 168 hours.  Imaging Dg Chest 2 View  Result Date: 08/04/2018 CLINICAL DATA:  Shortness of breath, tachypnea, fever EXAM: CHEST - 2 VIEW COMPARISON:  Chest x-ray of December 07, 2016 FINDINGS: The lungs are mildly hypoinflated. The interstitial markings are coarse. The perihilar lung markings are increased. The heart is normal in size. The pulmonary vascularity is not clearly engorged. There is no pleural effusion or pneumothorax. IMPRESSION: Mild hypoinflation. No alveolar pneumonia. Coarse interstitial lung markings may reflect interstitial pneumonia in the appropriate clinical setting. No definite CHF. Electronically Signed   By: David  Martinique M.D.   On: 08/04/2018 09:49   Dg Chest Port 1 View  Result Date: 08/05/2018 CLINICAL DATA:  Cough.  Shortness of breath. EXAM: PORTABLE CHEST 1 VIEW COMPARISON:  08/04/2018. FINDINGS: Mild cardiomegaly. Diffuse progressive bilateral pulmonary interstitial prominence suggesting interstitial. Pneumonitis cannot be excluded. No pleural effusion or pneumothorax. Tubing and EKG leads noted over the chest. Left shoulder replacement. IMPRESSION: Mild cardiomegaly. Diffuse progressive bilateral pulmonary interstitial prominence suggesting interstitial edema. Pneumonitis cannot be excluded. Electronically Signed   By: Marcello Moores  Register   On: 08/05/2018 05:40   STUDIES:  Chest x-ray reviewed by myself  CULTURES: Blood cultures-no growth  Urine cultures-no growth  ANTIBIOTICS: Day 2 of cefepime Initially did receive vancomycin, Flagyl and cefepime  SIGNIFICANT EVENTS: He is hemodynamically stable at present Mentating well, lactate did improve  LINES/TUBES: Peripheral IV  DISCUSSION: Elderly gentleman who was sick at home for couple of days prior to coming into the hospital, came in with fatigue, fever, shortness of breath, cough He was septic with hypotension at presentation Findings currently notable for possible urinary tract infection as source of his sepsis He is feeling slightly better  ASSESSMENT / PLAN:  PULMONARY A: Hypoxemia Tachypnea Cough Pulmonary congestion, wheezing-may be related to congestion  P:   We  will continue oxygen supplementation Continue bronchodilators as needed I will add a macrolide antibiotic-azithromycin  CARDIOVASCULAR A:  Hemodynamically stable History of hypertension  P:  We will continue to hold blood pressure medications Discontinue IV fluids  RENAL A:   Stable P:   Continues to have good urine output Follow electrolytes  GASTROINTESTINAL A:   Nausea Tolerating orally P:   Continue oral intake Encourage oral fluid intake  HEMATOLOGIC A:   Stable Thrombocytopenia-likely related to sepsis P:  Continue to monitor  INFECTIOUS A:   Urinary tract infection Concern for pneumonia P:   We will continue cefepime Follow cultures Urine strep pneumo and Legionella negative  NEUROLOGIC A:   Seizure disorder P:   RASS goal: 0 He continues on his antiseizure medications-Vimpat, Dilantin   FAMILY  - Updates: Partner at bedside, updated about status  Plan will be to de-escalate antibiotics as cultures become available At present we will add a macrolide antibiotic for concern for pneumonia-community-acquired We will also reinitiate antihypertensives as he becomes more stable Address his nausea with Zofran Pain and discomfort with Tylenol Wean oxygen supplementation as able Encourage oral fluid intake and regular diet  Pulmonary and Critical Care Medicine [facility] Pager: [phone]  08/05/2018, 9:01 AM

## 2018-08-05 NOTE — Care Management Note (Addendum)
Case Management Note  Patient Details  Name: [name] MRN: [id number] Date of Birth: 1956-11-29  Subjective/Objective:                   Fatigue .  Shortness of breath .  Fever Hx of schizophernia Action/Plan: Iv ns and iv maxipime/wbc-15.5/hypotensive Will follow for progression and cm needs  Expected Discharge Date:  (unknown)               Expected Discharge Plan:  Home/Self Care  In-House Referral:     Discharge planning Services  CM Consult  Post Acute Care Choice:    Choice offered to:     DME Arranged:    DME Agency:     HH Arranged:    HH Agency:     Status of Service:  In process, will continue to follow  If discussed at Long Length of Stay Meetings, dates discussed:    Additional Comments:  [name], RN 08/05/2018, 10:47 AM

## 2018-08-06 LAB — SEDIMENTATION RATE: Sed Rate: 80 mm/hr — ABNORMAL HIGH (ref 0–16)

## 2018-08-06 LAB — URINE CULTURE: Culture: NO GROWTH

## 2018-08-06 LAB — LEGIONELLA PNEUMOPHILA SEROGP 1 UR AG: L. pneumophila Serogp 1 Ur Ag: NEGATIVE

## 2018-08-06 LAB — CK: Total CK: 30 U/L — ABNORMAL LOW (ref 49–397)

## 2018-08-06 MED ORDER — LEVOFLOXACIN 500 MG PO TABS
500.0000 mg | ORAL_TABLET | Freq: Every day | ORAL | Status: DC
  Administered 2018-08-07: 500 mg via ORAL
  Filled 2018-08-06: qty 1

## 2018-08-06 MED ORDER — SODIUM CHLORIDE 0.9 % IV SOLN
2.0000 g | Freq: Three times a day (TID) | INTRAVENOUS | Status: AC
  Administered 2018-08-06 – 2018-08-07 (×2): 2 g via INTRAVENOUS
  Filled 2018-08-06 (×2): qty 2

## 2018-08-06 MED ORDER — LOSARTAN POTASSIUM 50 MG PO TABS
50.0000 mg | ORAL_TABLET | Freq: Every day | ORAL | Status: DC
  Administered 2018-08-06 – 2018-08-08 (×3): 50 mg via ORAL
  Filled 2018-08-06 (×3): qty 1

## 2018-08-06 MED ORDER — ZOLPIDEM TARTRATE 5 MG PO TABS: 5.0000 mg | ORAL_TABLET | Freq: Once | ORAL | Status: DC

## 2018-08-06 MED ORDER — AMLODIPINE BESYLATE 5 MG PO TABS
5.0000 mg | ORAL_TABLET | Freq: Every day | ORAL | Status: DC
  Administered 2018-08-06 – 2018-08-08 (×3): 5 mg via ORAL
  Filled 2018-08-06 (×3): qty 1

## 2018-08-06 MED ORDER — IBUPROFEN 800 MG PO TABS
800.0000 mg | ORAL_TABLET | Freq: Three times a day (TID) | ORAL | Status: DC | PRN
  Administered 2018-08-06 – 2018-08-07 (×2): 800 mg via ORAL
  Filled 2018-08-06 (×3): qty 1

## 2018-08-06 NOTE — Care Management Note (Signed)
Case Management Note  Patient Details  Name: [name] MRN: [id number] Date of Birth: [birth year]  Subjective/Objective:                  Temp 99.3/o2 [place]/ iv ns and iv maxipime/wcb-15.5  Action/Plan: Following for progression and cm needs  Expected Discharge Date:  (unknown)               Expected Discharge Plan:  Home/Self Care  In-House Referral:     Discharge planning Services  CM Consult  Post Acute Care Choice:    Choice offered to:     DME Arranged:    DME Agency:     HH Arranged:    HH Agency:     Status of Service:  In process, will continue to follow  If discussed at Long Length of Stay Meetings, dates discussed:    Additional Comments:  [name], RN 08/06/2018, 9:33 AM

## 2018-08-06 NOTE — Progress Notes (Signed)
  Progress Note   Date: 08/06/2018  Patient Name: [name]        MRN#: [id number]   Clarification of diagnosis:   Sepsis with organ dysfunction at presentation    Heavenleigh Petruzzi Clearence Ped, MD

## 2018-08-06 NOTE — Progress Notes (Signed)
[name] Progress Note Patient Name: [name] [name] DOB: 18-Nov-1956 MRN: [id number]   Date of Service  08/06/2018  HPI/Events of Note  Hypertension - BP = 166/99 with MAP = 118.  eICU Interventions  Will order: 1. Restart home Amlodipine and Cozaar.     Intervention Category Major Interventions: Hypertension - evaluation and management  [name] 08/06/2018, 8:02 PM

## 2018-08-06 NOTE — Progress Notes (Signed)
PULMONARY / CRITICAL CARE MEDICINE   Name: [name] MRN: [id number] DOB: [birth year]    ADMISSION DATE:  08/04/2018 CONSULTATION DATE:  08/04/2018  CHIEF COMPLAINT:   .   fatigue .   Shortness of breath .   fever  HISTORY OF PRESENT ILLNESS:   Patient came in with a 2-day history of shortness of breath, cough, just feeling generally under the weather Denies any nausea no vomiting No abdominal pain or discomfort Denies any dysuria  PAST MEDICAL HISTORY :  He  has a past medical history of Anxiety, Constipation, Depression, Dyslipidemia, GERD (gastroesophageal reflux disease), Hearing loss, History of echocardiogram, Hypertension, Memory difficulty (10/25/2017), Memory loss, MRSA carrier, Schizoaffective disorder, and Seizures ([place]).  PAST SURGICAL HISTORY: He  has a past surgical history that includes self orchectomy; Colonoscopy (2010); ORIF shoulder fracture; Shoulder Closed Reduction (Left, 09/16/2013); Shoulder hemi-arthroplasty (Left, 09/18/2013); and Carpal tunnel release.  Allergies  Allergen Reactions  . Codeine Nausea And Vomiting  . Sulfa Antibiotics Nausea And Vomiting  . Sulfasalazine Nausea And Vomiting    No current facility-administered medications on file prior to encounter.    Current Outpatient Medications on File Prior to Encounter  Medication Sig  . ibuprofen (ADVIL,MOTRIN) 200 MG tablet Take 200 mg by mouth every 6 (six) hours as needed for fever or mild pain.  Marland Kitchen testosterone cypionate (DEPOTESTOSTERONE CYPIONATE) 200 MG/ML injection Inject 0.75 mLs (150 mg total) into the muscle every 14 (fourteen) days.  Marland Kitchen amitriptyline (ELAVIL) 100 MG tablet Take 2 tablets (200 mg total) by mouth at bedtime.  Marland Kitchen amLODipine (NORVASC) 5 MG tablet Take 1 tablet (5 mg total) by mouth daily.  Marland Kitchen aspirin EC 81 MG tablet Take 1 tablet (81 mg total) by mouth daily.  Marland Kitchen atorvastatin (LIPITOR) 40 MG tablet Take 1 tablet (40 mg total) by mouth daily.  . clonazePAM (KLONOPIN) 1 MG  tablet Take 1 tablet (1 mg total) by mouth 2 (two) times daily.  . hydrochlorothiazide (HYDRODIURIL) 25 MG tablet Take 1 tablet (25 mg total) by mouth daily.  . Lacosamide (VIMPAT) 150 MG TABS Take 1 tablet (150 mg total) by mouth 2 (two) times daily.  Marland Kitchen losartan (COZAAR) 50 MG tablet TAKE 1 TABLET BY MOUTH EVERY DAY (Patient taking differently: Take 50 mg by mouth daily. )  . metoprolol succinate (TOPROL-XL) 100 MG 24 hr tablet TAKE 2 TABLETS (200 MG TOTAL) DAILY BY MOUTH.  . Multiple Vitamin (MULTIVITAMIN WITH MINERALS) TABS tablet Take 1 tablet by mouth daily.  . phenytoin (DILANTIN) 100 MG ER capsule Take 1 capsule (100 mg total) by mouth at bedtime.  . phenytoin (PHENYTOIN INFATABS) 50 MG tablet CHEW 1 TABLET BY MOUTH AT BEDTIME (Patient taking differently: Chew 50 mg by mouth at bedtime. )  . QUEtiapine (SEROQUEL) 300 MG tablet Take 2 tablets (600 mg total) by mouth at bedtime.    FAMILY HISTORY:  His family history includes Alcohol abuse in his brother and brother; Diabetes in his brother; Stroke in his father and mother. There is no history of Colon cancer, Esophageal cancer, Stomach cancer, or Rectal cancer.  SOCIAL HISTORY: He  reports that he has never smoked. He has never used smokeless tobacco. He reports that he does not drink alcohol or use drugs.  REVIEW OF SYSTEMS:   Review of Systems  Gastrointestinal: Positive for nausea.  Musculoskeletal: Positive for myalgias.  All other systems reviewed and are negative.    SUBJECTIVE:  Still feeling under the weather Still has a nausea  Muscle aches  VITAL SIGNS: BP (!) 157/79   Pulse 97   Temp 99.2 F (37.3 C) (Oral)   Resp (!) 25   Ht 5\' 11"  (1.803 m)   Wt 86.3 kg   SpO2 (!) 89%   BMI 26.54 kg/m   INTAKE / OUTPUT: I/O last 3 completed shifts: In: 2645.1 [P.O.:360; I.V.:1985.1; IV Piggyback:300] Out: 6000 [Urine:6000]  PHYSICAL EXAMINATION: General: Elderly gentleman does appear comfortable Neuro: Moving all  extremities with no focality HEENT: Moist oral mucosa Cardiovascular: S1-S2 appreciated Lungs: Clear breath sounds bilaterally Abdomen: Soft, bowel sounds appreciated Musculoskeletal: Moving all extremities Skin: Warm and dry  LABS:  BMET Recent Labs  Lab 08/04/18 0931 08/05/18 0351  NA 138 140  K 3.6 3.8  CL 106 114*  CO2 21* 21*  BUN 35* 21  CREATININE 1.60* 0.98  GLUCOSE 162* 141*    Electrolytes Recent Labs  Lab 08/04/18 0931 08/05/18 0351  CALCIUM 8.4* 7.9*  MG  --  2.3  PHOS  --  1.8*    CBC Recent Labs  Lab 08/04/18 0931 08/05/18 0351  WBC 12.9* 15.5*  HGB 13.9 13.0  HCT 39.7 36.9*  PLT 109* 93*    Coag's Recent Labs  Lab 08/04/18 0931  INR 1.20    Sepsis Markers Recent Labs  Lab 08/04/18 0932 08/04/18 1138  LATICACIDVEN 3.77* 1.46   Liver Enzymes Recent Labs  Lab 08/04/18 0931  AST 156*  ALT 264*  ALKPHOS 245*  BILITOT 0.9  ALBUMIN 3.4*    Cardiac Enzymes No results for input(s): TROPONINI, PROBNP in the last 168 hours.  Glucose No results for input(s): GLUCAP in the last 168 hours.  Imaging No results found.  CULTURES: Blood cultures show no growth Urine cultures show no growth  ANTIBIOTICS: Was on cefepime  LINES/TUBES: Peripheral IV  DISCUSSION: Gentleman with multiple comorbidities who came in with fever, shortness of breath Urine did reveal nitrates and leukocyte esterase blood cultures negative Chest x-ray did reveal interstitial prominence but no clear-cut infiltrate  ASSESSMENT / PLAN:  PULMONARY A: Possible pneumonia Mild hypoxemia Abnormal chest x-ray P:   We will wean him off oxygen  CARDIOVASCULAR A:  Hypertension Blood pressure little bit high this morning P:  Continues antihypertensives  RENAL Renal parameters have been stable  GASTROINTESTINAL A:   Persistent nausea P:   Continue Zofran   INFECTIOUS A:   Empiric treatment for UTI/pneumonia P:   Switch him to  fluoroquinolone  Neurology: History of seizure disorder His last seizure was over 4 years ago   FAMILY  - Updates: Updated part not bedside   We will transition patient to oral fluoroquinolone Pain management All cultures have been negative Sepsis picture did resolve BP control Levaquin 500 p.o. daily for 5 days  Did speak with hospitalist Dr. Juliette Alcide transfer patient to their service    Pulmonary and [place] Pager: [phone]  08/06/2018, 10:44 AM

## 2018-08-06 NOTE — Progress Notes (Signed)
[place] Progress Note Patient Name: [name] DOB: 1955-12-25 MRN: [id number]   Date of Service  08/06/2018  HPI/Events of Note  Patient requests a sleep aid.   eICU Interventions  Will order: 1. Ambien 5 mg PO X 1 now.      Intervention Category Major Interventions: Other:  Lysle Dingwall 08/06/2018, 10:34 PM

## 2018-08-07 ENCOUNTER — Inpatient Hospital Stay (HOSPITAL_COMMUNITY): Payer: Medicaid Other

## 2018-08-07 LAB — CBC
HCT: 40.2 % (ref 39.0–52.0)
Hemoglobin: 14.5 g/dL (ref 13.0–17.0)
MCH: 33.5 pg (ref 26.0–34.0)
MCHC: 36.1 g/dL — ABNORMAL HIGH (ref 30.0–36.0)
MCV: 92.8 fL (ref 78.0–100.0)
Platelets: 93 10*3/uL — ABNORMAL LOW (ref 150–400)
RBC: 4.33 MIL/uL (ref 4.22–5.81)
RDW: 13.9 % (ref 11.5–15.5)
WBC: 7.1 10*3/uL (ref 4.0–10.5)

## 2018-08-07 LAB — BASIC METABOLIC PANEL
Anion gap: 10 (ref 5–15)
BUN: 13 mg/dL (ref 8–23)
CO2: 24 mmol/L (ref 22–32)
Calcium: 9.1 mg/dL (ref 8.9–10.3)
Chloride: 103 mmol/L (ref 98–111)
Creatinine, Ser: 0.87 mg/dL (ref 0.61–1.24)
GFR calc Af Amer: >60 mL/min (ref >60)
GFR calc non Af Amer: >60 mL/min (ref >60)
Glucose, Bld: 142 mg/dL — ABNORMAL HIGH (ref 70–99)
Potassium: 3.8 mmol/L (ref 3.5–5.1)
Sodium: 137 mmol/L (ref 135–145)

## 2018-08-07 MED ORDER — MUSCLE RUB 10-15 % EX CREA
TOPICAL_CREAM | CUTANEOUS | Status: DC | PRN
  Filled 2018-08-07: qty 85

## 2018-08-07 MED ORDER — AMITRIPTYLINE HCL 25 MG PO TABS: 200.0000 mg | ORAL_TABLET | Freq: Every day | ORAL | Status: DC

## 2018-08-07 MED ORDER — CEFDINIR 300 MG PO CAPS
300.0000 mg | ORAL_CAPSULE | Freq: Two times a day (BID) | ORAL | Status: DC
  Administered 2018-08-07 – 2018-08-08 (×3): 300 mg via ORAL
  Filled 2018-08-07 (×3): qty 1

## 2018-08-07 NOTE — Care Management Note (Signed)
Case Management Note  Patient Details  Name: [name] MRN: [id number] Date of Birth: August 19, 1956  Subjective/Objective:                  report feeling better than yesterday however feels very weak.  Has not get out of bed today.  Denies chest pain and shortness of breath.  No cough.  Remains afebrile.  No acute events overnight.  Action/Plan: Possible dc on [id number] if improvement continues. Has a good support group at home with family/no cm needs present.  Expected Discharge Date:  (unknown)               Expected Discharge Plan:  Home/Self Care  In-House Referral:     Discharge planning Services  CM Consult  Post Acute Care Choice:    Choice offered to:     DME Arranged:    DME Agency:     HH Arranged:    HH Agency:     Status of Service:  In process, will continue to follow  If discussed at Long Length of Stay Meetings, dates discussed:    Additional Comments:  [name], RN 08/07/2018, 12:58 PM

## 2018-08-07 NOTE — Care Management Note (Signed)
Case Management Note  Patient Details  Name: [name] MRN: [id number] Date of Birth: [birth year]  Subjective/Objective:                     fatigue .   Shortness of breath .   fever Temp-99.8 hr-125/bp-177/105-po bp meds restarted on [id number] Action/Plan: Following for progression and cm needs.lives alone depends on a sign. Other and friends  Expected Discharge Date:  (unknown)               Expected Discharge Plan:  Home/Self Care  In-House Referral:     Discharge planning Services  CM Consult  Post Acute Care Choice:    Choice offered to:     DME Arranged:    DME Agency:     HH Arranged:    HH Agency:     Status of Service:  In process, will continue to follow  If discussed at Long Length of Stay Meetings, dates discussed:    Additional Comments:  [name], RN 08/07/2018, 9:31 AM

## 2018-08-07 NOTE — Evaluation (Signed)
Physical Therapy Evaluation Patient Details Name: [name] MRN: [id number] DOB: 1956/03/07 Today's Date: 08/07/2018   History of Present Illness  62 year old male with medical history of seizures and memory loss, schizoaffective disorder, hypertension and hypogonadism on testosterone and admitted for septic shock due to UTI  Clinical Impression  Pt admitted with above diagnosis. Pt currently with functional limitations due to the deficits listed below (see PT Problem List).  Pt will benefit from skilled PT to increase their independence and safety with mobility to allow discharge to the venue listed below.  Pt agreeable to mobilize as he reports increased weakness from remaining in bed since admission.  Pt requiring min assist for steadying and due to LE weakness/uncoordination during ambulation.  SPO2 99% on 1.5 L O2 [place] at rest (present on arrival) however pt 96% on room air after ambulating in hallway so left O2 [place] off pt and he is agreeable to report any SOB to nursing.  Pt encouraged to get to recliner for meals and into bathroom with nursing staff to improve his mobility and weakness during acute stay.  Pt reports "someone" moving in with him however uncertain if they can provide current assist level.  Recommend SNF at this time.     Follow Up Recommendations SNF;Supervision/Assistance - 24 hour    Equipment Recommendations  Rolling walker with 5" wheels    Recommendations for Other Services       Precautions / Restrictions Precautions Precautions: Fall      Mobility  Bed Mobility Overal bed mobility: Needs Assistance Bed Mobility: Supine to Sit     Supine to sit: Supervision;HOB elevated        Transfers Overall transfer level: Needs assistance Equipment used: Rolling walker (2 wheeled) Transfers: Sit to/from Stand Sit to Stand: Min assist         General transfer comment: verbal cues for safe technique, assist to rise and steady, posterior  bias  Ambulation/Gait Ambulation/Gait assistance: Min assist Gait Distance (Feet): 40 Feet Assistive device: Rolling walker (2 wheeled) Gait Pattern/deviations: Step-through pattern;Decreased stride length;Trunk flexed     General Gait Details: verbal cues for RW positioning and posture, pt heavily reliant on UEs for support, LEs appear uncoordinated, assist for steadying, distance limited by weakness and fatigue  Stairs            Wheelchair Mobility    Modified Rankin (Stroke Patients Only)       Balance Overall balance assessment: Needs assistance         Standing balance support: Bilateral upper extremity supported Standing balance-Leahy Scale: Poor Standing balance comment: heavily reliant on UEs support                             Pertinent Vitals/Pain Pain Assessment: No/denies pain    Home Living Family/patient expects to be discharged to:: Private residence Living Arrangements: Alone   Type of Home: Apartment       Home Layout: One level Home Equipment: None Additional Comments: pt reports "someone is going to move in with me"    Prior Function Level of Independence: Independent               Hand Dominance        Extremity/Trunk Assessment        Lower Extremity Assessment Lower Extremity Assessment: Generalized weakness(5/5 MMT testing however reports generalized weakness)       Communication   Communication: No difficulties  Cognition Arousal/Alertness: Awake/alert Behavior During Therapy: Flat affect Overall Cognitive Status: Within Functional Limits for tasks assessed                                        General Comments      Exercises     Assessment/Plan    PT Assessment Patient needs continued PT services  PT Problem List Decreased strength;Decreased mobility;Decreased activity tolerance;Decreased balance;Decreased knowledge of use of DME;Decreased coordination       PT  Treatment Interventions DME instruction;Therapeutic activities;Gait training;Therapeutic exercise;Functional mobility training;Balance training;Patient/family education    PT Goals (Current goals can be found in the Care Plan section)  Acute Rehab PT Goals PT Goal Formulation: With patient Time For Goal Achievement: 08/14/18 Potential to Achieve Goals: Good    Frequency Min 3X/week   Barriers to discharge        Co-evaluation               AM-PAC PT "6 Clicks" Daily Activity  Outcome Measure Difficulty turning over in bed (including adjusting bedclothes, sheets and blankets)?: None Difficulty moving from lying on back to sitting on the side of the bed? : A Little Difficulty sitting down on and standing up from a chair with arms (e.g., wheelchair, bedside commode, etc,.)?: Unable Help needed moving to and from a bed to chair (including a wheelchair)?: A Little Help needed walking in hospital room?: A Little Help needed climbing 3-5 steps with a railing? : A Lot 6 Click Score: 16    End of Session Equipment Utilized During Treatment: Gait belt Activity Tolerance: Patient limited by fatigue Patient left: with call bell/phone within reach;in chair(agreeable to call for assist) Nurse Communication: Mobility status PT Visit Diagnosis: Other abnormalities of gait and mobility (R26.89)    Time: 1330-1350 PT Time Calculation (min) (ACUTE ONLY): 20 min   Charges:   PT Evaluation $PT Eval Low Complexity: 1 Low        Carmelia Bake, PT, DPT 08/07/2018 Pager: [phone]  York Ram E 08/07/2018, 3:10 PM

## 2018-08-07 NOTE — Progress Notes (Signed)
PROGRESS NOTE [name]   [name]   WHQ:[id number] DOB: 1956/04/02  DOA: 08/04/2018 PCP: Leeanne Rio, MD   Brief Narrative:  [name] is a 62 year old male with medical history of seizures and memory loss, schizoaffective disorder, hypertension and hypogonadism on testosterone presented to the emergency department complaining of fatigue, shortness of breath and fever.  Evaluation patient was found to be febrile, hypotensive, lab work-up revealed elevated WBC and UA concern for UTI.  Patient was admitted to the ICU unit due to septic shock and was placed on empiric antibiotics and aggressive IV resuscitation. Patient clinically improved and transferred to [facility] for further care.   Subjective: Patient seen and examined, report feeling better than yesterday however feels very weak.  Has not get out of bed today.  Denies chest pain and shortness of breath.  No cough.  Remains afebrile.  No acute events overnight.  Assessment & Plan: Septic shock due to UTI Sepsis physiology seems to improve, slight tachycardic. UA concerning for UTI with positive nitrate however urine culture was negative.  Urine culture was obtained post antibiotics, likely affecting quality of the results.  Patient was initially with cefepime and vancomycin, subsequently switch to p.o. Levaquin due to ?  Pneumonia.  Chest x-ray does not show signs of acute pulmonary infections.  Will place patient on cephalosporin Omnicef to complete UTI treatment.  Acute respiratory failure with mild hypoxia  Felt to be related to infectious process, chest xray with no signs of PNA  Successfully weaned off O2   HTN  Initially was hypotensive, now BP stable  Continue Amlodipine and losartan  Monitor BP   Seizures disorder  No seizure activities in 4 years Continue Vimpat and Dilantin   Decondition  Likely due to illness, will get PT   DVT prophylaxis: Lovenox  Code Status: Full Code Family Communication:  None at bedside  Disposition Plan: Home in AM if remains stable   Consultants:   None   Procedures:   None   Antimicrobials: Anti-infectives (From admission, onward)   Start     Dose/Rate Route Frequency Ordered Stop   08/07/18 1000  levofloxacin (LEVAQUIN) tablet 500 mg     500 mg Oral Daily 08/06/18 1101 08/12/18 0959   08/06/18 1800  ceFEPIme (MAXIPIME) 2 g in sodium chloride 0.9 % 100 mL IVPB     2 g 200 mL/hr over 30 Minutes Intravenous Every 8 hours 08/06/18 1107 08/07/18 0357   08/06/18 1000  azithromycin (ZITHROMAX) tablet 250 mg  Status:  Discontinued     250 mg Oral Daily 08/05/18 0920 08/06/18 1101   08/05/18 1000  ceFEPIme (MAXIPIME) 2 g in sodium chloride 0.9 % 100 mL IVPB  Status:  Discontinued     2 g 200 mL/hr over 30 Minutes Intravenous Every 8 hours 08/05/18 0852 08/06/18 1101   08/05/18 1000  azithromycin (ZITHROMAX) tablet 500 mg     500 mg Oral Daily 08/05/18 0920 08/05/18 1113   08/04/18 2200  ceFEPIme (MAXIPIME) 2 g in sodium chloride 0.9 % 100 mL IVPB  Status:  Discontinued     2 g 200 mL/hr over 30 Minutes Intravenous Every 12 hours 08/04/18 1419 08/05/18 0852   08/04/18 0945  ceFEPIme (MAXIPIME) 2 g in sodium chloride 0.9 % 100 mL IVPB     2 g 200 mL/hr over 30 Minutes Intravenous  Once 08/04/18 0944 08/04/18 1023   08/04/18 0945  metroNIDAZOLE (FLAGYL) IVPB 500 mg  Status:  Discontinued  500 mg 100 mL/hr over 60 Minutes Intravenous Every 8 hours 08/04/18 0944 08/04/18 1416   08/04/18 0945  vancomycin (VANCOCIN) IVPB 1000 mg/200 mL premix     1,000 mg 200 mL/hr over 60 Minutes Intravenous  Once 08/04/18 0944 08/04/18 1203           Objective: Vitals:   08/07/18 0500 08/07/18 0700 08/07/18 0800 08/07/18 0900  BP:  (!) 163/98  (!) 173/98  Pulse:  (!) 125  (!) 103  Resp:  18  (!) 26  Temp:   98.3 F (36.8 C)   TempSrc:   Oral   SpO2:  92%  92%  Weight: 88.5 kg     Height:        Intake/Output Summary (Last 24 hours) at 08/07/2018  1001 Last data filed at 08/07/2018 0900 Gross per 24 hour  Intake 200 ml  Output 2575 ml  Net -2375 ml   Filed Weights   08/05/18 0500 08/06/18 0451 08/07/18 0500  Weight: 91 kg 86.3 kg 88.5 kg    Examination:  General exam: Appears calm and comfortable  Respiratory system: Clear to auscultation. No wheezes,crackle or rhonchi Cardiovascular system: S1 & S2 heard, RRR. No JVD, murmurs, rubs or gallops Gastrointestinal system: Abdomen is nondistended, soft and nontender. Normal bowel sounds heard. Central nervous system: Alert and oriented. No focal neurological deficits. Extremities: No pedal edema.  Skin: No rashes, lesions or ulcers Psychiatry: Mood & affect appropriate.    Data Reviewed: I have personally reviewed following labs and imaging studies  CBC: Recent Labs  Lab 08/04/18 0931 08/05/18 0351 08/07/18 0725  WBC 12.9* 15.5* 7.1  NEUTROABS 12.6  --   --   HGB 13.9 13.0 14.5  HCT 39.7 36.9* 40.2  MCV 94.7 95.1 92.8  PLT 109* 93* 93*   Basic Metabolic Panel: Recent Labs  Lab 08/04/18 0931 08/05/18 0351  NA 138 140  K 3.6 3.8  CL 106 114*  CO2 21* 21*  GLUCOSE 162* 141*  BUN 35* 21  CREATININE 1.60* 0.98  CALCIUM 8.4* 7.9*  MG  --  2.3  PHOS  --  1.8*   GFR: Estimated Creatinine Clearance: 81.1 mL/min (by C-G formula based on SCr of 0.98 mg/dL). Liver Function Tests: Recent Labs  Lab 08/04/18 0931  AST 156*  ALT 264*  ALKPHOS 245*  BILITOT 0.9  PROT 6.4*  ALBUMIN 3.4*   No results for input(s): LIPASE, AMYLASE in the last 168 hours. No results for input(s): AMMONIA in the last 168 hours. Coagulation Profile: Recent Labs  Lab 08/04/18 0931  INR 1.20   Cardiac Enzymes: Recent Labs  Lab 08/06/18 1148  CKTOTAL 30*   BNP (last 3 results) No results for input(s): PROBNP in the last 8760 hours. HbA1C: No results for input(s): HGBA1C in the last 72 hours. CBG: No results for input(s): GLUCAP in the last 168 hours. Lipid Profile: No  results for input(s): CHOL, HDL, LDLCALC, TRIG, CHOLHDL, LDLDIRECT in the last 72 hours. Thyroid Function Tests: No results for input(s): TSH, T4TOTAL, FREET4, T3FREE, THYROIDAB in the last 72 hours. Anemia Panel: No results for input(s): VITAMINB12, FOLATE, FERRITIN, TIBC, IRON, RETICCTPCT in the last 72 hours. Sepsis Labs: Recent Labs  Lab 08/04/18 0932 08/04/18 1138  LATICACIDVEN 3.77* 1.46    Recent Results (from the past 240 hour(s))  Culture, blood (Routine x 2)     Status: None (Preliminary result)   Collection Time: 08/04/18  9:31 AM  Result Value Ref Range Status  Specimen Description   Final    BLOOD LEFT ANTECUBITAL Performed at [facility] [address] [place], [place] [address]    Special Requests   Final    BOTTLES DRAWN AEROBIC AND ANAEROBIC Blood Culture adequate volume Performed at [facility] [address], [place], [place] [address]    Culture   Final    NO GROWTH 2 DAYS Performed at [facility] [address] [place], [place] [address]    Report Status PENDING  Incomplete  Culture, blood (Routine x 2)     Status: None (Preliminary result)   Collection Time: 08/04/18  9:31 AM  Result Value Ref Range Status   Specimen Description   Final    BLOOD RIGHT ANTECUBITAL Performed at [facility] [address], [place], [place] [address]    Special Requests   Final    BOTTLES DRAWN AEROBIC AND ANAEROBIC Blood Culture adequate volume Performed at [facility] [address], [place], [place] [address]    Culture   Final    NO GROWTH 2 DAYS Performed at [facility] [address] [place], [place] [address]    Report Status PENDING  Incomplete  Urine culture     Status: None   Collection Time: 08/04/18 12:11 PM  Result Value Ref Range Status   Specimen Description   Final    URINE, RANDOM Performed at [facility] [address] [place], [place] [address]    Special Requests   Final    NONE Performed at [facility], [place] [address] [place], [place] [address]    Culture   Final    NO GROWTH Performed at [facility], [place] [address], [place], [place] [address]    Report Status 08/06/2018 FINAL  Final  MRSA PCR Screening     Status: None   Collection Time: 08/04/18  4:13 PM  Result Value Ref Range Status   MRSA by PCR NEGATIVE NEGATIVE Final    Comment:        The GeneXpert MRSA Assay (FDA approved for NASAL specimens only), is one component of a comprehensive MRSA colonization surveillance program. It is not intended to diagnose MRSA infection nor to guide or monitor treatment for MRSA infections. Performed at [facility], [place] [address], [place], [place] [address]       Radiology Studies: Dg Chest Port 1 View  Result Date: 08/07/2018 CLINICAL DATA:  Shortness of breath EXAM: PORTABLE CHEST 1 VIEW COMPARISON:  08/05/2018 FINDINGS: Cardiac shadow is within normal limits. Previously seen vascular congestion and interstitial change has nearly completely resolved. No focal infiltrate or sizable effusion is seen. Postsurgical changes are noted in the shoulders bilaterally. IMPRESSION: Significant improvement in the degree of CHF. Electronically Signed   By: Inez Catalina M.D.   On: 08/07/2018 08:45      Scheduled Meds: . amitriptyline  200 mg Oral QHS  . amLODipine  5 mg Oral Daily  . atorvastatin  40 mg Oral Daily  . enoxaparin (LOVENOX) injection  40 mg Subcutaneous Q24H  . lacosamide  150 mg Oral BID  . levofloxacin  500 mg Oral Daily  . losartan  50 mg Oral Daily  . mouth rinse  15 mL Mouth Rinse BID  . multivitamin with minerals  1 tablet Oral Daily  . phenytoin  50 mg Oral QHS  . phenytoin  100 mg Oral  QHS  . QUEtiapine  600 mg Oral QHS  . [START ON 08/22/2018] testosterone cypionate  150 mg Intramuscular Q14 Days  . zolpidem  5 mg Oral Once    Continuous Infusions: . sodium chloride       LOS: 3 days    Time spent: Total of 25 minutes spent with pt, greater than 50% of which was spent in discussion of  treatment, counseling and coordination of care    Chipper Oman, MD Pager: Text Page via [email or web address]   If 7PM-7AM, please contact night-coverage [email or web address] 08/07/2018, 10:01 AM   Note - This record has been created using [employer]. Chart creation errors have been sought, but may not always have been located. Such creation errors do not reflect on the standard of medical care.

## 2018-08-08 DIAGNOSIS — J810 Acute pulmonary edema: Secondary | ICD-10-CM

## 2018-08-08 DIAGNOSIS — N390 Urinary tract infection, site not specified: Secondary | ICD-10-CM

## 2018-08-08 DIAGNOSIS — J9601 Acute respiratory failure with hypoxia: Secondary | ICD-10-CM

## 2018-08-08 DIAGNOSIS — N179 Acute kidney failure, unspecified: Secondary | ICD-10-CM

## 2018-08-08 MED ORDER — BISACODYL 5 MG PO TBEC
10.0000 mg | DELAYED_RELEASE_TABLET | Freq: Once | ORAL | Status: AC
  Administered 2018-08-08: 10 mg via ORAL
  Filled 2018-08-08 (×2): qty 2

## 2018-08-08 MED ORDER — CEPHALEXIN 500 MG PO CAPS: 500.0000 mg | ORAL_CAPSULE | Freq: Four times a day (QID) | ORAL | Status: DC

## 2018-08-08 MED ORDER — ACETAMINOPHEN 325 MG PO TABS: 650.0000 mg | ORAL_TABLET | ORAL | 0 refills | Status: DC | PRN

## 2018-08-08 MED ORDER — CEPHALEXIN 500 MG PO CAPS: 500.0000 mg | ORAL_CAPSULE | Freq: Four times a day (QID) | ORAL | 0 refills | Status: AC

## 2018-08-08 NOTE — Progress Notes (Signed)
Physical Therapy Treatment Patient Details Name: [name] MRN: [id number] DOB: 1956-09-16 Today's Date: 08/08/2018    History of Present Illness 62 year old male with medical history of seizures and memory loss, schizoaffective disorder, hypertension and hypogonadism on testosterone and admitted for septic shock due to UTI    PT Comments    Pt has progressed quite well with mobility, he ambulated 100' with RW + 200' without an assistive device with no loss of balance. From PT standpoint, he is ready to DC home. Pt reported he feels his energy level is improving but is not back to baseline. Encouraged pt to ambulate at home frequently to improve his endurance.    Follow Up Recommendations  No PT follow up     Equipment Recommendations  None recommended by PT    Recommendations for Other Services       Precautions / Restrictions Precautions Precautions: Fall Precaution Comments: pt denies h/o falls Restrictions Weight Bearing Restrictions: No    Mobility  Bed Mobility Overal bed mobility: Modified Independent Bed Mobility: Supine to Sit     Supine to sit: Modified independent (Device/Increase time)     General bed mobility comments: HOB up 20*, used rail  Transfers Overall transfer level: Needs assistance Equipment used: Rolling walker (2 wheeled) Transfers: Sit to/from Stand Sit to Stand: Supervision         General transfer comment: supervision for safety  Ambulation/Gait Ambulation/Gait assistance: Supervision Gait Distance (Feet): 300 Feet Assistive device: Rolling walker (2 wheeled);None Gait Pattern/deviations: Step-through pattern;Decreased stride length;Trunk flexed Gait velocity: WFL   General Gait Details: ambulated 100' with RW with trunk flexed, 200' without assistive device with no loss of balance   Stairs             Wheelchair Mobility    Modified Rankin (Stroke Patients Only)       Balance Overall balance assessment:  Needs assistance   Sitting balance-Leahy Scale: Good       Standing balance-Leahy Scale: Good                              Cognition Arousal/Alertness: Awake/alert Behavior During Therapy: WFL for tasks assessed/performed Overall Cognitive Status: Within Functional Limits for tasks assessed                                        Exercises      General Comments        Pertinent Vitals/Pain Pain Assessment: No/denies pain    Home Living                      Prior Function            PT Goals (current goals can now be found in the care plan section) Acute Rehab PT Goals PT Goal Formulation: With patient Time For Goal Achievement: 08/14/18 Potential to Achieve Goals: Good Progress towards PT goals: Progressing toward goals    Frequency    Min 3X/week      PT Plan Discharge plan needs to be updated    Co-evaluation              AM-PAC PT "6 Clicks" Daily Activity  Outcome Measure  Difficulty turning over in bed (including adjusting bedclothes, sheets and blankets)?: None Difficulty moving from lying on back to sitting on the  side of the bed? : None Difficulty sitting down on and standing up from a chair with arms (e.g., wheelchair, bedside commode, etc,.)?: None Help needed moving to and from a bed to chair (including a wheelchair)?: None Help needed walking in hospital room?: None Help needed climbing 3-5 steps with a railing? : A Little 6 Click Score: 23    End of Session Equipment Utilized During Treatment: Gait belt Activity Tolerance: Patient tolerated treatment well Patient left: with call bell/phone within reach;in chair(agreeable to call for assist) Nurse Communication: Mobility status PT Visit Diagnosis: Other abnormalities of gait and mobility (R26.89)     Time: 1331-1340 PT Time Calculation (min) (ACUTE ONLY): 9 min  Charges:  $Gait Training: 8-22 mins                        [name] 08/08/2018, 1:47 PM [phone]

## 2018-08-08 NOTE — Discharge Summary (Signed)
Physician Discharge Summary  [name]:[id number] DOB: [birth year] DOA: 08/04/2018  PCP: Leeanne Rio, MD  Admit date: 08/04/2018 Discharge date: 08/08/2018  Admitted From: Home  Disposition:  Home   Recommendations for Outpatient Follow-up and new medication changes:  1. Follow up with Dr. Ardelia Mems in 7 days.  2. Continue antibiotic therapy with Cephalexin for 4 days.  3. Patient declined SNF.   Home Health: yes   Equipment/Devices: walker     Discharge Condition: stable  CODE STATUS: full   Diet recommendation: Regular.   Brief/Interim Summary: 62 year old who presented with dyspnea.  He does have significant past medical history depression, seizures, and dyslipidemia.  Reported 2 days of worsening dyspnea, associated with generalized malaise, nonproductive cough, and fever.  Initial physical examination temperature 103.3, heart rate 70, respiratory rate 28, blood pressure 76/55, oxygen saturation 97%.  He was awake and alert, his lungs were clear bilaterally, heart S1-S2 present and rhythmic, abdomen was soft nontender, no lower extremity edema.  Sodium 136, potassium 3.6, chloride 106, bicarb 21, glucose 162, BUN 35, creatinine 1.6, white cell count 12.9, hemoglobin 13.9, hematocrit 39.7, platelets 109 His urine analysis had greater than 50 white cells, specific gravity 1.012.  His chest x-ray showed bilateral interstitial infiltrates.  EKG normal sinus rhythm, left axis deviation with prolonged QRS, intraventricular conduction delay.   Patient was admitted to the hospital with the working diagnosis of sepsis due to urinary tract infection, complicated by acute cardiogenic pulmonary edema.  1.  Sepsis due to urinary tract infection, present on admission.  Patient was admitted to the intensive care unit, received aggressive volume resuscitation with isotonic saline, his blood pressure improved with no vasopressor requirement.  He was initially treated with cefepime and  vancomycin intravenously, posteriorly transitioned to oral antibiotic therapy with Omnicef.  At discharge patient will continue cephalexin.   2.  Acute diastolic heart failure, related to sepsis syndrome, complicated by cardiogenic pulmonary edema and acute hypoxic respiratory failure.  Patient received aggressive medical therapy for sepsis, with improvement of cardiac function and pulmonary edema.  Follow-up chest radiograph showed resolution of infiltrates on August 22.  Discharge oximetry 94% on room air.   3.  Acute kidney injury due to sepsis.  Patient received supportive medical therapy with improvement of his kidney function, discharge creatinine 0.87, potassium 3.8 and serum bicarb 24.  4.  Hypertension.  Blood pressure has remained stable, patient will resume amlodipine and losartan.  5.  Seizure disorder.  Stable with no seizure activity continue phenytoin and Vimpat.   6.  Deconditioning.  Patient was seen by physical therapy, recommendations for skilled nursing facility at discharge, but patient had decline this disposition and he will be discharged home with home health.  Discharge Diagnoses:  Active Problems:   Sepsis [facility])    Discharge Instructions   Allergies as of 08/08/2018      Reactions   Codeine Nausea And Vomiting   Sulfa Antibiotics Nausea And Vomiting   Sulfasalazine Nausea And Vomiting      Medication List    STOP taking these medications   ibuprofen 200 MG tablet Commonly known as:  ADVIL,MOTRIN     TAKE these medications   acetaminophen 325 MG tablet Commonly known as:  TYLENOL Take 2 tablets (650 mg total) by mouth every 4 (four) hours as needed for up to 20 doses for moderate pain.   amitriptyline 100 MG tablet Commonly known as:  ELAVIL Take 2 tablets (200 mg total) by mouth at  bedtime.   amLODipine 5 MG tablet Commonly known as:  NORVASC Take 1 tablet (5 mg total) by mouth daily.   aspirin EC 81 MG tablet Take 1 tablet (81 mg total) by  mouth daily.   atorvastatin 40 MG tablet Commonly known as:  LIPITOR Take 1 tablet (40 mg total) by mouth daily.   cephALEXin 500 MG capsule Commonly known as:  KEFLEX Take 1 capsule (500 mg total) by mouth every 6 (six) hours for 4 days.   clonazePAM 1 MG tablet Commonly known as:  KLONOPIN Take 1 tablet (1 mg total) by mouth 2 (two) times daily.   hydrochlorothiazide 25 MG tablet Commonly known as:  HYDRODIURIL Take 1 tablet (25 mg total) by mouth daily.   Lacosamide 150 MG Tabs Take 1 tablet (150 mg total) by mouth 2 (two) times daily.   losartan 50 MG tablet Commonly known as:  COZAAR TAKE 1 TABLET BY MOUTH EVERY DAY   metoprolol succinate 100 MG 24 hr tablet Commonly known as:  TOPROL-XL TAKE 2 TABLETS (200 MG TOTAL) DAILY BY MOUTH.   multivitamin with minerals Tabs tablet Take 1 tablet by mouth daily.   phenytoin 100 MG ER capsule Commonly known as:  DILANTIN Take 1 capsule (100 mg total) by mouth at bedtime.   phenytoin 50 MG tablet Commonly known as:  DILANTIN CHEW 1 TABLET BY MOUTH AT BEDTIME What changed:    how much to take  how to take this  when to take this  additional instructions   QUEtiapine 300 MG tablet Commonly known as:  SEROQUEL Take 2 tablets (600 mg total) by mouth at bedtime.   testosterone cypionate 200 MG/ML injection Commonly known as:  DEPOTESTOSTERONE CYPIONATE Inject 0.75 mLs (150 mg total) into the muscle every 14 (fourteen) days.       Allergies  Allergen Reactions  . Codeine Nausea And Vomiting  . Sulfa Antibiotics Nausea And Vomiting  . Sulfasalazine Nausea And Vomiting    Consultations:     Procedures/Studies: Dg Chest 2 View  Result Date: 08/04/2018 CLINICAL DATA:  Shortness of breath, tachypnea, fever EXAM: CHEST - 2 VIEW COMPARISON:  Chest x-ray of December 07, 2016 FINDINGS: The lungs are mildly hypoinflated. The interstitial markings are coarse. The perihilar lung markings are increased. The heart is  normal in size. The pulmonary vascularity is not clearly engorged. There is no pleural effusion or pneumothorax. IMPRESSION: Mild hypoinflation. No alveolar pneumonia. Coarse interstitial lung markings may reflect interstitial pneumonia in the appropriate clinical setting. No definite CHF. Electronically Signed   By: David  Martinique M.D.   On: 08/04/2018 09:49   Dg Chest Port 1 View  Result Date: 08/07/2018 CLINICAL DATA:  Shortness of breath EXAM: PORTABLE CHEST 1 VIEW COMPARISON:  08/05/2018 FINDINGS: Cardiac shadow is within normal limits. Previously seen vascular congestion and interstitial change has nearly completely resolved. No focal infiltrate or sizable effusion is seen. Postsurgical changes are noted in the shoulders bilaterally. IMPRESSION: Significant improvement in the degree of CHF. Electronically Signed   By: Inez Catalina M.D.   On: 08/07/2018 08:45   Dg Chest Port 1 View  Result Date: 08/05/2018 CLINICAL DATA:  Cough.  Shortness of breath. EXAM: PORTABLE CHEST 1 VIEW COMPARISON:  08/04/2018. FINDINGS: Mild cardiomegaly. Diffuse progressive bilateral pulmonary interstitial prominence suggesting interstitial. Pneumonitis cannot be excluded. No pleural effusion or pneumothorax. Tubing and EKG leads noted over the chest. Left shoulder replacement. IMPRESSION: Mild cardiomegaly. Diffuse progressive bilateral pulmonary interstitial prominence suggesting interstitial edema.  Pneumonitis cannot be excluded. Electronically Signed   By: Marcello Moores  Register   On: 08/05/2018 05:40       Subjective: Patient is feeling well, close to baseline, no nausea or vomiting, no chest pain or dyspnea.   Discharge Exam: Vitals:   08/07/18 2041 08/08/18 0506  BP: (!) 143/88 (!) 148/89  Pulse: (!) 109 94  Resp: 19 18  Temp: 98.4 F (36.9 C) 99.3 F (37.4 C)  SpO2: 97% 94%   Vitals:   08/07/18 1032 08/07/18 1513 08/07/18 2041 08/08/18 0506  BP: (!) 143/92 (!) 144/89 (!) 143/88 (!) 148/89  Pulse: (!)  112 (!) 109 (!) 109 94  Resp: (!) 24 (!) 22 19 18   Temp: 99.3 F (37.4 C) 99.6 F (37.6 C) 98.4 F (36.9 C) 99.3 F (37.4 C)  TempSrc: Oral Oral Oral Oral  SpO2: 93% 94% 97% 94%  Weight:    86.5 kg  Height:        General: Not in pain or dyspnea, deconditioned  Neurology: Awake and alert, non focal  E ENT: mild pallor, no icterus, oral mucosa moist Cardiovascular: No JVD. S1-S2 present, rhythmic, no gallops, rubs, or murmurs. No lower extremity edema. Pulmonary: vesicular breath sounds bilaterally, adequate air movement, no wheezing, rhonchi or rales. Gastrointestinal. Abdomen protuberant with no organomegaly, non tender, no rebound or guarding Skin. No rashes Musculoskeletal: no joint deformities   The results of significant diagnostics from this hospitalization (including imaging, microbiology, ancillary and laboratory) are listed below for reference.     Microbiology: Recent Results (from the past 240 hour(s))  Culture, blood (Routine x 2)     Status: None (Preliminary result)   Collection Time: 08/04/18  9:31 AM  Result Value Ref Range Status   Specimen Description   Final    BLOOD LEFT ANTECUBITAL Performed at [facility] [address], [place], [place] [address]    Special Requests   Final    BOTTLES DRAWN AEROBIC AND ANAEROBIC Blood Culture adequate volume Performed at [facility] [address] [place], [place] [address]    Culture   Final    NO GROWTH 4 DAYS Performed at [facility], [place] [address] [place], [place] [address]    Report Status PENDING  Incomplete  Culture, blood (Routine x 2)     Status: None (Preliminary result)   Collection Time: 08/04/18  9:31 AM  Result Value Ref Range Status   Specimen Description   Final    BLOOD RIGHT ANTECUBITAL Performed at [facility] [address] [place], [place] [address]    Special Requests   Final    BOTTLES DRAWN AEROBIC AND ANAEROBIC  Blood Culture adequate volume Performed at [facility] [address], [place], [place] [address]    Culture   Final    NO GROWTH 4 DAYS Performed at [facility], [place] [address], [place], [place] [address]    Report Status PENDING  Incomplete  Urine culture     Status: None   Collection Time: 08/04/18 12:11 PM  Result Value Ref Range Status   Specimen Description   Final    URINE, RANDOM Performed at [facility] [address] [place], [place] [address]    Special Requests   Final    NONE Performed at [facility], [place] [address] [place],  [address]    Culture   Final    NO GROWTH  Performed at [facility], [place] [address], [place], [place] [address]    Report Status 08/06/2018 FINAL  Final  MRSA PCR Screening     Status: None   Collection Time: 08/04/18  4:13 PM  Result Value Ref Range Status   MRSA by PCR NEGATIVE NEGATIVE Final    Comment:        The GeneXpert MRSA Assay (FDA approved for NASAL specimens only), is one component of a comprehensive MRSA colonization surveillance program. It is not intended to diagnose MRSA infection nor to guide or monitor treatment for MRSA infections. Performed at [facility], [place] [address] [place], [place] [address]      Labs: BNP (last 3 results) No results for input(s): BNP in the last 8760 hours. Basic Metabolic Panel: Recent Labs  Lab 08/04/18 0931 08/05/18 0351 08/07/18 0725  NA 138 140 137  K 3.6 3.8 3.8  CL 106 114* 103  CO2 21* 21* 24  GLUCOSE 162* 141* 142*  BUN 35* 21 13  CREATININE 1.60* 0.98 0.87  CALCIUM 8.4* 7.9* 9.1  MG  --  2.3  --   PHOS  --  1.8*  --    Liver Function Tests: Recent Labs  Lab 08/04/18 0931  AST 156*  ALT 264*  ALKPHOS 245*  BILITOT 0.9  PROT 6.4*  ALBUMIN 3.4*   No results for input(s): LIPASE, AMYLASE in the last 168 hours. No results for input(s): AMMONIA in the last  168 hours. CBC: Recent Labs  Lab 08/04/18 0931 08/05/18 0351 08/07/18 0725  WBC 12.9* 15.5* 7.1  NEUTROABS 12.6  --   --   HGB 13.9 13.0 14.5  HCT 39.7 36.9* 40.2  MCV 94.7 95.1 92.8  PLT 109* 93* 93*   Cardiac Enzymes: Recent Labs  Lab 08/06/18 1148  CKTOTAL 30*   BNP: Invalid input(s): POCBNP CBG: No results for input(s): GLUCAP in the last 168 hours. D-Dimer No results for input(s): DDIMER in the last 72 hours. Hgb A1c No results for input(s): HGBA1C in the last 72 hours. Lipid Profile No results for input(s): CHOL, HDL, LDLCALC, TRIG, CHOLHDL, LDLDIRECT in the last 72 hours. Thyroid function studies No results for input(s): TSH, T4TOTAL, T3FREE, THYROIDAB in the last 72 hours.  Invalid input(s): FREET3 Anemia work up No results for input(s): VITAMINB12, FOLATE, FERRITIN, TIBC, IRON, RETICCTPCT in the last 72 hours. Urinalysis    Component Value Date/Time   COLORURINE YELLOW 08/04/2018 1211   APPEARANCEUR HAZY (A) 08/04/2018 1211   LABSPEC 1.012 08/04/2018 1211   PHURINE 5.0 08/04/2018 1211   GLUCOSEU NEGATIVE 08/04/2018 1211   HGBUR LARGE (A) 08/04/2018 1211   HGBUR negative 07/18/2010 1436   BILIRUBINUR NEGATIVE 08/04/2018 1211   BILIRUBINUR NEG 01/17/2016 1446   KETONESUR NEGATIVE 08/04/2018 1211   PROTEINUR 30 (A) 08/04/2018 1211   UROBILINOGEN 0.2 01/17/2016 1446   UROBILINOGEN 0.2 07/29/2015 1708   NITRITE POSITIVE (A) 08/04/2018 1211   LEUKOCYTESUR LARGE (A) 08/04/2018 1211   Sepsis Labs Invalid input(s): PROCALCITONIN,  WBC,  LACTICIDVEN Microbiology Recent Results (from the past 240 hour(s))  Culture, blood (Routine x 2)     Status: None (Preliminary result)   Collection Time: 08/04/18  9:31 AM  Result Value Ref Range Status   Specimen Description   Final    BLOOD LEFT ANTECUBITAL Performed at [facility], [place] [address], [place], [place] [address]    Special Requests   Final    BOTTLES DRAWN  AEROBIC AND ANAEROBIC  Blood Culture adequate volume Performed at [facility] [address], [place], [place] [address]    Culture   Final    NO GROWTH 4 DAYS Performed at [facility], [place] [address] [place], [place] [address]    Report Status PENDING  Incomplete  Culture, blood (Routine x 2)     Status: None (Preliminary result)   Collection Time: 08/04/18  9:31 AM  Result Value Ref Range Status   Specimen Description   Final    BLOOD RIGHT ANTECUBITAL Performed at [facility] [address], [place], [place] [address]    Special Requests   Final    BOTTLES DRAWN AEROBIC AND ANAEROBIC Blood Culture adequate volume Performed at [facility] [address] [place], [place] [address]    Culture   Final    NO GROWTH 4 DAYS Performed at [facility], [place] [address] [place], [place] [address]    Report Status PENDING  Incomplete  Urine culture     Status: None   Collection Time: 08/04/18 12:11 PM  Result Value Ref Range Status   Specimen Description   Final    URINE, RANDOM Performed at [facility] [address], [place], [place] [address]    Special Requests   Final    NONE Performed at [facility], [place] [address] [place], [place] [address]    Culture   Final    NO GROWTH Performed at [facility], [place] [address], [place], [place] [address]    Report Status 08/06/2018 FINAL  Final  MRSA PCR Screening     Status: None   Collection Time: 08/04/18  4:13 PM  Result Value Ref Range Status   MRSA by PCR NEGATIVE NEGATIVE Final    Comment:        The GeneXpert MRSA Assay (FDA approved for NASAL specimens only), is one component of a comprehensive MRSA colonization surveillance program. It is not intended to diagnose MRSA infection nor to guide or monitor treatment for MRSA infections. Performed at [facility], [place] [address], [place], [place]  [address]      Time coordinating discharge: 45 minutes  SIGNED:   Tawni Millers, MD  [name] 08/08/2018, 11:37 AM Pager [phone]  If 7PM-7AM, please contact night-coverage [email or web address] Password [email or web address]

## 2018-08-08 NOTE — Care Management Note (Signed)
Case Management Note  Patient Details  Name: [name] MRN: [id number] Date of Birth: [birth year]  Subjective/Objective:                  Discharge and hhc  Action/Plan: Needs pt and ot-Medicaid-advanced hhc notified .  Expected Discharge Date:  08/08/18               Expected Discharge Plan:  Home/Self Care  In-House Referral:     Discharge planning Services  CM Consult  Post Acute Care Choice:    Choice offered to:     DME Arranged:    DME Agency:     HH Arranged:    HH Agency:     Status of Service:  In process, will continue to follow  If discussed at Long Length of Stay Meetings, dates discussed:    Additional Comments:  [name], RN 08/08/2018, 12:36 PM

## 2018-08-09 LAB — CULTURE, BLOOD (ROUTINE X 2)
Culture: NO GROWTH
Culture: NO GROWTH
Special Requests: ADEQUATE
Special Requests: ADEQUATE

## 2018-08-11 ENCOUNTER — Other Ambulatory Visit: Payer: Self-pay

## 2018-08-11 DIAGNOSIS — I10 Essential (primary) hypertension: Secondary | ICD-10-CM

## 2018-08-11 MED ORDER — HYDROCHLOROTHIAZIDE 25 MG PO TABS: 25.0000 mg | ORAL_TABLET | Freq: Every day | ORAL | 3 refills | Status: DC

## 2018-08-12 ENCOUNTER — Telehealth: Payer: Self-pay | Admitting: Neurology

## 2018-08-12 DIAGNOSIS — R413 Other amnesia: Secondary | ICD-10-CM

## 2018-08-12 NOTE — Telephone Encounter (Signed)
Dr. [name]- do you know of anyone else we could refer him to?

## 2018-08-12 NOTE — Addendum Note (Signed)
Addended by: Kathrynn Ducking on: 08/12/2018 12:56 PM   Modules accepted: Orders

## 2018-08-12 NOTE — Telephone Encounter (Signed)
I called the patient.  The neuropsychology testing at [facility] neurolog[facility] cannot be done until February 2020.  I will see if I can get the patient in sooner through [facility].

## 2018-08-12 NOTE — Telephone Encounter (Signed)
Pt has called asking if there is another office he may go to for the recommended testing by Dr [name].  Pt states he called [facility], Psy.D. Office and was told they would not be able to get him in until Feb.  This is preventing pt from being able to drive.  Please call

## 2018-08-14 ENCOUNTER — Encounter: Payer: Self-pay | Admitting: Psychology

## 2018-08-14 NOTE — Telephone Encounter (Signed)
Pt has called to inform that he received a call from a practice in [place] that does not accept Medicaid, it would cost pt $500.00 and it would be 3 trips to a total of $1500 which is not an option for pt.  Pt is asking for a place in [place] that does accept Medicaid.  Please call

## 2018-08-14 NOTE — Telephone Encounter (Signed)
I will make a referral to [facility].  The patient is unable for the other referral to [place].

## 2018-08-14 NOTE — Addendum Note (Signed)
Addended by: Kathrynn Ducking on: 08/14/2018 04:48 PM   Modules accepted: Orders

## 2018-08-19 ENCOUNTER — Other Ambulatory Visit: Payer: Self-pay

## 2018-08-19 ENCOUNTER — Encounter: Payer: Self-pay | Admitting: Family Medicine

## 2018-08-19 ENCOUNTER — Ambulatory Visit (INDEPENDENT_AMBULATORY_CARE_PROVIDER_SITE_OTHER): Payer: Medicaid Other | Admitting: Family Medicine

## 2018-08-19 DIAGNOSIS — A419 Sepsis, unspecified organism: Secondary | ICD-10-CM | POA: Diagnosis not present

## 2018-08-19 DIAGNOSIS — Z23 Encounter for immunization: Secondary | ICD-10-CM | POA: Diagnosis not present

## 2018-08-19 NOTE — Assessment & Plan Note (Signed)
Has resolved.  No symptoms of significant BPH so not at increased risk and does not likely need further work up unless recurs

## 2018-08-19 NOTE — Progress Notes (Signed)
Subjective  [name] is a 62 y.o. male is presenting with the following  UROSEPSIS and subsequent CHF Was admitted in mid August for urosepsis.  He feels back to normal now.  No fever fatigue dysuria or other urinary symptoms.  Does not urinate in the middle of the night or feels he has obstructive symptoms.  Is back to his normal activity.  No shortness of breath or chest pain or edema.  His kidney function had returned to normal at discharge    Chief Complaint noted Review of Symptoms - see HPI PMH - Smoking status noted.    Objective Vital Signs reviewed BP 112/68   Pulse 75   Temp 97.8 F (36.6 C) (Oral)   Ht 5\' 11"  (1.803 m)   Wt 188 lb 3.2 oz (85.4 kg)   SpO2 98%   BMI 26.25 kg/m   Psych:  Cognition and judgment appear intact. Alert, communicative  and cooperative with normal attention span and concentration. No apparent delusions, illusions, hallucinations Heart - Regular rate and rhythm.  No murmurs, gallops or rubs.    Lungs:  Normal respiratory effort, chest expands symmetrically. Lungs are clear to auscultation, no crackles or wheezes. able to get up and down from exam table without assistance Extremities:  No cyanosis, edema, or deformity noted with good range of motion of all major joints.     Assessments/Plans  See after visit summary for details of patient instuctions  Sepsis ([place]) Has resolved.  No symptoms of significant BPH so not at increased risk and does not likely need further work up unless recurs

## 2018-08-19 NOTE — Patient Instructions (Signed)
Good to see you today!  Thanks for coming in.  Keep hydrated.   If you have any urinary symptoms - burning change in color or frequency then let us

## 2018-08-25 ENCOUNTER — Other Ambulatory Visit (HOSPITAL_COMMUNITY): Payer: Self-pay

## 2018-08-25 DIAGNOSIS — F251 Schizoaffective disorder, depressive type: Secondary | ICD-10-CM

## 2018-08-25 MED ORDER — CLONAZEPAM 1 MG PO TABS: 1.0000 mg | ORAL_TABLET | Freq: Two times a day (BID) | ORAL | 2 refills | Status: DC

## 2018-08-25 MED ORDER — AMITRIPTYLINE HCL 100 MG PO TABS: 200.0000 mg | ORAL_TABLET | Freq: Every day | ORAL | 2 refills | Status: DC

## 2018-08-25 MED ORDER — QUETIAPINE FUMARATE 300 MG PO TABS: 600.0000 mg | ORAL_TABLET | Freq: Every day | ORAL | 2 refills | Status: DC

## 2018-08-26 ENCOUNTER — Telehealth: Payer: Self-pay | Admitting: Family Medicine

## 2018-08-26 NOTE — Telephone Encounter (Signed)
Pt is calling concerning his yearly paper work that Dr. Ardelia Mems fills out for [employer]. Pt has to have this done to keep his drivers license. He had spoke with Dr. Ardelia Mems and she told him he would not be able to come in have these papers filled out until he had his neuro appointment and received those results. He was just told he will not be able to been at the neurologist until 09/23/18. His paperwork is due on 09/08/18 and he says they have already extended this deadline. Pt would like for someone to call him as soon as possible to discuss what needs to be done to get these forms filled out so he does not lose his drivers license. The best call back number is [phone] or his girlfriends number [phone].

## 2018-08-27 ENCOUNTER — Other Ambulatory Visit: Payer: Self-pay | Admitting: Cardiovascular Disease

## 2018-08-28 ENCOUNTER — Ambulatory Visit (HOSPITAL_COMMUNITY): Payer: Self-pay | Admitting: Psychiatry

## 2018-09-01 NOTE — Telephone Encounter (Signed)
Pt walked in to discuss forms.   He is ok with waiting till neuro appt but he would like a letter stating issue to take to [facility].  Letter created and given to patient. Jaymi Tinner, Salome Spotted, CMA

## 2018-09-04 ENCOUNTER — Telehealth: Payer: Self-pay | Admitting: Neurology

## 2018-09-04 NOTE — Telephone Encounter (Signed)
I called the daughter.  The patient apparently is demonstrating delusional thinking patterns, he believes the people are stealing from him.  He has been accusatory towards his daughter, but he has not demonstrated physical aggression.  Medications generally do not help delusional thinking much.  May be part of an organic dementing illness.

## 2018-09-04 NOTE — Telephone Encounter (Signed)
Would you like this patient to come in for a follow up sooner given his history.

## 2018-09-04 NOTE — Telephone Encounter (Signed)
Pts partner Sharon(on DPR) requesting a call stating she is unsure how to handle the pt, he has changed drastically. [name] got upset while speak and did not wish to discuss further with me. Please call to advise

## 2018-09-04 NOTE — Telephone Encounter (Signed)
I called the daughter, left a message, I will call back later.

## 2018-09-11 ENCOUNTER — Other Ambulatory Visit: Payer: Self-pay | Admitting: Family Medicine

## 2018-09-11 ENCOUNTER — Other Ambulatory Visit: Payer: Self-pay | Admitting: Neurology

## 2018-09-11 DIAGNOSIS — I10 Essential (primary) hypertension: Secondary | ICD-10-CM

## 2018-09-16 ENCOUNTER — Other Ambulatory Visit: Payer: Self-pay | Admitting: Family Medicine

## 2018-10-08 ENCOUNTER — Other Ambulatory Visit (HOSPITAL_COMMUNITY): Payer: Self-pay | Admitting: Psychiatry

## 2018-10-08 DIAGNOSIS — F251 Schizoaffective disorder, depressive type: Secondary | ICD-10-CM

## 2018-10-16 ENCOUNTER — Other Ambulatory Visit: Payer: Self-pay | Admitting: Family Medicine

## 2018-10-16 DIAGNOSIS — I10 Essential (primary) hypertension: Secondary | ICD-10-CM

## 2018-10-22 ENCOUNTER — Ambulatory Visit (INDEPENDENT_AMBULATORY_CARE_PROVIDER_SITE_OTHER): Payer: Medicaid Other | Admitting: Psychiatry

## 2018-10-22 ENCOUNTER — Encounter (HOSPITAL_COMMUNITY): Payer: Self-pay | Admitting: Psychiatry

## 2018-10-22 VITALS — BP 110/71 | HR 70 | Ht 71.0 in | Wt 199.0 lb

## 2018-10-22 DIAGNOSIS — F411 Generalized anxiety disorder: Secondary | ICD-10-CM | POA: Diagnosis not present

## 2018-10-22 DIAGNOSIS — F250 Schizoaffective disorder, bipolar type: Secondary | ICD-10-CM

## 2018-10-22 MED ORDER — AMITRIPTYLINE HCL 100 MG PO TABS: 200.0000 mg | ORAL_TABLET | Freq: Every day | ORAL | 2 refills | Status: DC

## 2018-10-22 MED ORDER — CLONAZEPAM 1 MG PO TABS: 1.0000 mg | ORAL_TABLET | Freq: Every day | ORAL | 1 refills | Status: DC

## 2018-10-22 MED ORDER — QUETIAPINE FUMARATE 300 MG PO TABS: 600.0000 mg | ORAL_TABLET | Freq: Every day | ORAL | 2 refills | Status: DC

## 2018-10-22 NOTE — Progress Notes (Signed)
[facility] MD/PA/NP OP Progress Note  10/22/2018 11:09 AM [name]  MRN:  [id number]  Chief Complaint: I am doing so-so.  I did not sleep last night.  I stopped taking Klonopin.  HPI: [name] came for his follow-up appointment.  He stopped taking Klonopin 2 weeks ago because he felt he is taking too many medication but he noticed started to have anxiety, confusion, poor sleep and racing thoughts.  He also started to have paranoia and feeling overwhelmed.  He reported that he let a friend to live in his house and he knows this male friend from peers support group which he used to attend in the past.  He noticed it is causing some stress and he does not feel that he freedom in the house.  He also accused her that she is dealing his things.  But this woman has no place and he like to help her.  He admitted it is making him confused because he does not like strangers.  Patient has chronic paranoia and he sometimes does not leave the house because very scared around people.  He is taking Seroquel and amitriptyline but reluctant to take Klonopin because he felt it is making him more anxious.  In August he was admitted because of sepsis but is feeling better.  He is concerned that he is taking too many medication.  Has any suicidal thoughts or homicidal thought.  His personal hygiene is neglected.  His energy level is fair.  He had gained weight since the last visit.  He is not doing exercise or watching his calorie intake.  Visit Diagnosis:    ICD-10-CM   1. GAD (generalized anxiety disorder) F41.1 clonazePAM (KLONOPIN) 1 MG tablet    amitriptyline (ELAVIL) 100 MG tablet    DISCONTINUED: clonazePAM (KLONOPIN) 1 MG tablet    DISCONTINUED: amitriptyline (ELAVIL) 100 MG tablet  2. Schizoaffective disorder, bipolar type (HCC) F25.0 QUEtiapine (SEROQUEL) 300 MG tablet    Past Psychiatric History: Reviewed. Patient has depression since his college. He has at least 15 hospitalization due to severe depression,  paranoia and suicidal thoughts. His last hospitalization was in August 2016. At that time he was thinking to kill himself by inhaling helium. He was discharged on Seroquel 900 mg and amitriptyline 400 mgat bedtime.He had history of suicidal attempt when he cut his testicle because he does not want to be depressed. He was seeing [name] at [facility] and then moved to [name]. Patient has multiple trial of SSRIs however he had reaction to SSRIs. In the past he had tried nortriptyline, Haldol, Geodon, Seroquel, lithium, Prozac, Wellbutrin, Paxil, Ativan, Lexapro and Zoloft. Patient denies any history of mania or any drug use.  Past Medical History:  Past Medical History:  Diagnosis Date  . Anxiety   . Constipation   . Depression   . Dyslipidemia   . GERD (gastroesophageal reflux disease)   . Hearing loss   . History of echocardiogram    Echo 8/18: EF 50-55, normal wall motion, grade 1 diastolic dysfunction, mild MR  . Hypertension   . Memory difficulty 10/25/2017  . Memory loss   . MRSA carrier   . Schizoaffective disorder   . Seizures ([place])     Past Surgical History:  Procedure Laterality Date  . CARPAL TUNNEL RELEASE    . COLONOSCOPY  2010  . ORIF SHOULDER FRACTURE    . self orchectomy    . SHOULDER CLOSED REDUCTION Left 09/16/2013   Procedure:  CLOSED REDUCTION SHOULDER;  Surgeon: Mauri Pole, MD;  Location: [facility];  Service: Orthopedics;  Laterality: Left;  . SHOULDER HEMI-ARTHROPLASTY Left 09/18/2013   Procedure: LEFT SHOULDER HEMI-ARTHROPLASTY;  Surgeon: Augustin Schooling, MD;  Location: [place];  Service: Orthopedics;  Laterality: Left;    Family Psychiatric History: Reviewed.  Family History:  Family History  Problem Relation Age of Onset  . Stroke Father        Living at 49  . Stroke Mother        Stroke in late 16's. Died in her 80's  . Alcohol abuse Brother   . Alcohol abuse Brother   . Diabetes Brother   . Colon cancer Neg Hx    . Esophageal cancer Neg Hx   . Stomach cancer Neg Hx   . Rectal cancer Neg Hx     Social History:  Social History   Socioeconomic History  . Marital status: Single    Spouse name: Not on file  . Number of children: 0  . Years of education: 34  . Highest education level: Not on file  Occupational History  . Occupation: disabled  Social Needs  . Financial resource strain: Not hard at all  . Food insecurity:    Worry: Never true    Inability: Never true  . Transportation needs:    Medical: No    Non-medical: No  Tobacco Use  . Smoking status: Never Smoker  . Smokeless tobacco: Never Used  Substance and Sexual Activity  . Alcohol use: No    Alcohol/week: 0.0 standard drinks  . Drug use: No  . Sexual activity: Not Currently    Partners: Female    Birth control/protection: None  Lifestyle  . Physical activity:    Days per week: 0 days    Minutes per session: 0 min  . Stress: Only a little  Relationships  . Social connections:    Talks on phone: More than three times a week    Gets together: More than three times a week    Attends religious service: Never    Active member of club or organization: No    Attends meetings of clubs or organizations: Never    Relationship status: Not on file  Other Topics Concern  . Not on file  Social History Narrative   Patient does not drink caffeine.   Patient is left handed.    Allergies:  Allergies  Allergen Reactions  . Codeine Nausea And Vomiting  . Sulfa Antibiotics Nausea And Vomiting  . Sulfasalazine Nausea And Vomiting    Metabolic Disorder Labs: Lab Results  Component Value Date   HGBA1C 6.0 04/01/2018   MPG 111 07/30/2015   MPG 123 07/13/2015   Lab Results  Component Value Date   PROLACTIN 12.0 07/13/2015   Lab Results  Component Value Date   CHOL 141 04/01/2018   TRIG 116 04/01/2018   HDL 55 04/01/2018   CHOLHDL 2.6 04/01/2018   VLDL 28 02/18/2017   LDLCALC 63 04/01/2018   LDLCALC 80 02/18/2017    Lab Results  Component Value Date   TSH 2.100 04/01/2018   TSH 2.644 07/13/2015    Therapeutic Level Labs: No results found for: LITHIUM No results found for: VALPROATE No components found for:  CBMZ  Current Medications: Current Outpatient Medications  Medication Sig Dispense Refill  . acetaminophen (TYLENOL) 325 MG tablet Take 2 tablets (650 mg total) by mouth every 4 (four) hours as needed for up to 20 doses  for moderate pain. (Patient not taking: Reported on 08/19/2018) 20 tablet 0  . amitriptyline (ELAVIL) 100 MG tablet Take 2 tablets (200 mg total) by mouth at bedtime. 60 tablet 2  . amLODipine (NORVASC) 5 MG tablet Take 1 tablet (5 mg total) by mouth daily. 90 tablet 3  . aspirin EC 81 MG tablet Take 1 tablet (81 mg total) by mouth daily.    Marland Kitchen atorvastatin (LIPITOR) 40 MG tablet Take 1 tablet (40 mg total) by mouth daily. 90 tablet 3  . clonazePAM (KLONOPIN) 1 MG tablet Take 1 tablet (1 mg total) by mouth 2 (two) times daily. 60 tablet 2  . hydrochlorothiazide (HYDRODIURIL) 25 MG tablet Take 1 tablet (25 mg total) by mouth daily. 90 tablet 3  . Lacosamide (VIMPAT) 150 MG TABS Take 1 tablet (150 mg total) by mouth 2 (two) times daily. 60 tablet 5  . losartan (COZAAR) 50 MG tablet TAKE 1 TABLET BY MOUTH EVERY DAY 90 tablet 1  . metoprolol succinate (TOPROL-XL) 100 MG 24 hr tablet TAKE 2 TABLETS (200 MG TOTAL) DAILY BY MOUTH. 180 tablet 3  . Multiple Vitamin (MULTIVITAMIN WITH MINERALS) TABS tablet Take 1 tablet by mouth daily.    . phenytoin (DILANTIN) 100 MG ER capsule Take 1 capsule (100 mg total) by mouth at bedtime. 90 capsule 3  . phenytoin (DILANTIN) 100 MG ER capsule Take 1 capsule (100 mg total) by mouth at bedtime. 90 capsule 3  . phenytoin (PHENYTOIN INFATABS) 50 MG tablet CHEW 1 TABLET BY MOUTH AT BEDTIME (Patient taking differently: Chew 50 mg by mouth at bedtime. ) 90 tablet 3  . polyethylene glycol powder ([employer] PURELAX) powder TAKE 17 GRAMS 2 TIMES A DAY AS NEEDED FOR  CONSTIPATION. MIX INTO 8 OF FLUID AND DRINK 510 g 1  . QUEtiapine (SEROQUEL) 300 MG tablet Take 2 tablets (600 mg total) by mouth at bedtime. 60 tablet 2  . testosterone cypionate (DEPOTESTOSTERONE CYPIONATE) 200 MG/ML injection Inject 0.75 mLs (150 mg total) into the muscle every 14 (fourteen) days.     No current facility-administered medications for this visit.      Musculoskeletal: Strength & Muscle Tone: within normal limits Gait & Station: normal Patient leans: N/A  Psychiatric Specialty Exam: ROS  Blood pressure 110/71, pulse 70, height 5\' 11"  (1.803 m), weight 199 lb (90.3 kg), SpO2 96 %.There is no height or weight on file to calculate BMI.  General Appearance: Fairly Groomed  Eye Contact:  Fair  Speech:  Slow  Volume:  Decreased  Mood:  Anxious  Affect:  Congruent  Thought Process:  Goal Directed  Orientation:  Full (Time, Place, and Person)  Thought Content: Paranoid Ideation and Rumination   Suicidal Thoughts:  No  Homicidal Thoughts:  No  Memory:  Immediate;   Fair Recent;   Fair Remote;   Fair  Judgement:  Good  Insight:  Fair  Psychomotor Activity:  Decreased  Concentration:  Concentration: Fair and Attention Span: Fair  Recall:  [employer] of Knowledge: Good  Language: Good  Akathisia:  No  Handed:  Right  AIMS (if indicated): not done  Assets:  Desire for Improvement Housing Resilience  ADL's:  Intact  Cognition: WNL  Sleep:  Fair   Screenings: AIMS     Admission (Discharged) from 07/13/2015 in [facility]  AIMS Total Score  0    AUDIT     Admission (Discharged) from 07/13/2015 in [facility] Admission (  Discharged) from 05/08/2014 in [facility] Admission (Discharged) from 01/21/2014 in [facility]  Alcohol Use Disorder Identification Test Final Score (AUDIT)  0  0  0    Mini-Mental     Office Visit from 05/15/2018  in [facility] Office Visit from 10/25/2017 in [facility]  Total Score (max 30 points )  27  25    PHQ2-9     Office Visit from 08/19/2018 in [facility] to Hosp-Admission (Discharged) from 08/04/2018 in [facility] Office Visit from 07/11/2018 in [facility] Office Visit from 07/26/2017 in [facility] Office Visit from 05/02/2017 in [facility]  PHQ-2 Total Score  0  0  0  6  0       Assessment and Plan: Schizoaffective disorder, bipolar type.  Generalized anxiety disorder.  Discussed risk of noncompliance with medication.  I do believe stopping Klonopin is making him more anxious and paranoid.  Recommended to take at least Klonopin 1 mg at bedtime to help his sleep and anxiety.  I again encouraged to resume therapy which he stopped but patient will think about it and let us know.  We talked about his living situation.  Patient is now thinking to asked the lady to move out because he believe it is causing more anxiety.  Continue Seroquel 600 mg at bedtime, amitriptyline 200 mg at bedtime.  Encouraged to resume healthy lifestyle and watch his calorie intake.  Follow-up in 3 months.    Kathlee Nations, MD 10/22/2018, 11:09 AM

## 2018-11-05 ENCOUNTER — Telehealth: Payer: Self-pay | Admitting: Neurology

## 2018-11-05 NOTE — Telephone Encounter (Signed)
Patient requesting a call back regarding a referral. He can be reached at [phone] or [phone].

## 2018-11-06 NOTE — Telephone Encounter (Signed)
Called and spoke to patient about 20 minutes he was very confused about referral and he was wanting me to hold for his wife but she was on the phone scheduling some things for her self. I did wait a while. I gave the patient my name and telephone and they are going tod call me back.

## 2018-11-18 NOTE — Telephone Encounter (Signed)
Patient has apt 12/23 2019 arrive at 11:30 . Patient is aware.

## 2018-11-18 NOTE — Telephone Encounter (Signed)
I have called Dr. McDermott's to see if patient's apt .

## 2018-11-22 ENCOUNTER — Other Ambulatory Visit: Payer: Self-pay | Admitting: Cardiovascular Disease

## 2018-11-25 ENCOUNTER — Encounter (HOSPITAL_COMMUNITY): Payer: Self-pay

## 2018-11-25 ENCOUNTER — Inpatient Hospital Stay (HOSPITAL_COMMUNITY): Payer: Medicaid Other

## 2018-11-25 ENCOUNTER — Other Ambulatory Visit: Payer: Self-pay

## 2018-11-25 ENCOUNTER — Emergency Department (HOSPITAL_COMMUNITY): Payer: Medicaid Other

## 2018-11-25 ENCOUNTER — Inpatient Hospital Stay (HOSPITAL_COMMUNITY)
Admission: EM | Admit: 2018-11-25 | Discharge: 2018-11-29 | DRG: 493 | Disposition: A | Discharge status: Home Health | Payer: Medicaid Other | Attending: Orthopaedic Surgery | Admitting: Orthopaedic Surgery

## 2018-11-25 DIAGNOSIS — F259 Schizoaffective disorder, unspecified: Secondary | ICD-10-CM | POA: Diagnosis present

## 2018-11-25 DIAGNOSIS — K5909 Other constipation: Secondary | ICD-10-CM | POA: Diagnosis not present

## 2018-11-25 DIAGNOSIS — I10 Essential (primary) hypertension: Secondary | ICD-10-CM | POA: Diagnosis not present

## 2018-11-25 DIAGNOSIS — G40909 Epilepsy, unspecified, not intractable, without status epilepticus: Secondary | ICD-10-CM

## 2018-11-25 DIAGNOSIS — Z79899 Other long term (current) drug therapy: Secondary | ICD-10-CM | POA: Diagnosis not present

## 2018-11-25 DIAGNOSIS — F332 Major depressive disorder, recurrent severe without psychotic features: Secondary | ICD-10-CM | POA: Diagnosis present

## 2018-11-25 DIAGNOSIS — I5032 Chronic diastolic (congestive) heart failure: Secondary | ICD-10-CM | POA: Diagnosis present

## 2018-11-25 DIAGNOSIS — K5900 Constipation, unspecified: Secondary | ICD-10-CM | POA: Diagnosis present

## 2018-11-25 DIAGNOSIS — K219 Gastro-esophageal reflux disease without esophagitis: Secondary | ICD-10-CM | POA: Diagnosis present

## 2018-11-25 DIAGNOSIS — Y92012 Bathroom of single-family (private) house as the place of occurrence of the external cause: Secondary | ICD-10-CM

## 2018-11-25 DIAGNOSIS — H9190 Unspecified hearing loss, unspecified ear: Secondary | ICD-10-CM | POA: Diagnosis present

## 2018-11-25 DIAGNOSIS — W010XXA Fall on same level from slipping, tripping and stumbling without subsequent striking against object, initial encounter: Secondary | ICD-10-CM | POA: Diagnosis present

## 2018-11-25 DIAGNOSIS — F419 Anxiety disorder, unspecified: Secondary | ICD-10-CM | POA: Diagnosis present

## 2018-11-25 DIAGNOSIS — S82832A Other fracture of upper and lower end of left fibula, initial encounter for closed fracture: Secondary | ICD-10-CM

## 2018-11-25 DIAGNOSIS — M79662 Pain in left lower leg: Secondary | ICD-10-CM | POA: Diagnosis present

## 2018-11-25 DIAGNOSIS — S82242A Displaced spiral fracture of shaft of left tibia, initial encounter for closed fracture: Secondary | ICD-10-CM | POA: Diagnosis not present

## 2018-11-25 DIAGNOSIS — S8252XA Displaced fracture of medial malleolus of left tibia, initial encounter for closed fracture: Secondary | ICD-10-CM | POA: Diagnosis present

## 2018-11-25 DIAGNOSIS — S82202A Unspecified fracture of shaft of left tibia, initial encounter for closed fracture: Secondary | ICD-10-CM | POA: Diagnosis present

## 2018-11-25 DIAGNOSIS — D72829 Elevated white blood cell count, unspecified: Secondary | ICD-10-CM | POA: Diagnosis present

## 2018-11-25 DIAGNOSIS — W19XXXD Unspecified fall, subsequent encounter: Secondary | ICD-10-CM | POA: Diagnosis not present

## 2018-11-25 DIAGNOSIS — R569 Unspecified convulsions: Secondary | ICD-10-CM

## 2018-11-25 DIAGNOSIS — S82209A Unspecified fracture of shaft of unspecified tibia, initial encounter for closed fracture: Secondary | ICD-10-CM | POA: Diagnosis present

## 2018-11-25 DIAGNOSIS — Z09 Encounter for follow-up examination after completed treatment for conditions other than malignant neoplasm: Secondary | ICD-10-CM

## 2018-11-25 DIAGNOSIS — F418 Other specified anxiety disorders: Secondary | ICD-10-CM | POA: Diagnosis not present

## 2018-11-25 DIAGNOSIS — I110 Hypertensive heart disease with heart failure: Secondary | ICD-10-CM | POA: Diagnosis present

## 2018-11-25 DIAGNOSIS — Z7982 Long term (current) use of aspirin: Secondary | ICD-10-CM

## 2018-11-25 DIAGNOSIS — W19XXXA Unspecified fall, initial encounter: Secondary | ICD-10-CM | POA: Diagnosis present

## 2018-11-25 DIAGNOSIS — E785 Hyperlipidemia, unspecified: Secondary | ICD-10-CM | POA: Diagnosis present

## 2018-11-25 DIAGNOSIS — Z419 Encounter for procedure for purposes other than remedying health state, unspecified: Secondary | ICD-10-CM

## 2018-11-25 LAB — BASIC METABOLIC PANEL
Anion gap: 9 (ref 5–15)
BUN: 28 mg/dL — ABNORMAL HIGH (ref 8–23)
CO2: 23 mmol/L (ref 22–32)
Calcium: 8.3 mg/dL — ABNORMAL LOW (ref 8.9–10.3)
Chloride: 105 mmol/L (ref 98–111)
Creatinine, Ser: 0.97 mg/dL (ref 0.61–1.24)
GFR calc Af Amer: >60 mL/min (ref >60)
GFR calc non Af Amer: >60 mL/min (ref >60)
Glucose, Bld: 107 mg/dL — ABNORMAL HIGH (ref 70–99)
Potassium: 4.5 mmol/L (ref 3.5–5.1)
Sodium: 137 mmol/L (ref 135–145)

## 2018-11-25 LAB — CBC WITH DIFFERENTIAL/PLATELET
Abs Immature Granulocytes: 0.08 10*3/uL — ABNORMAL HIGH (ref 0.00–0.07)
Basophils Absolute: 0.1 10*3/uL (ref 0.0–0.1)
Basophils Relative: 0 %
Eosinophils Absolute: 0.3 10*3/uL (ref 0.0–0.5)
Eosinophils Relative: 2 %
HCT: 46.8 % (ref 39.0–52.0)
Hemoglobin: 15.7 g/dL (ref 13.0–17.0)
Immature Granulocytes: 1 %
Lymphocytes Relative: 12 %
Lymphs Abs: 1.8 10*3/uL (ref 0.7–4.0)
MCH: 32.2 pg (ref 26.0–34.0)
MCHC: 33.5 g/dL (ref 30.0–36.0)
MCV: 95.9 fL (ref 80.0–100.0)
Monocytes Absolute: 0.9 10*3/uL (ref 0.1–1.0)
Monocytes Relative: 6 %
Neutro Abs: 11.8 10*3/uL — ABNORMAL HIGH (ref 1.7–7.7)
Neutrophils Relative %: 79 %
Platelets: 184 10*3/uL (ref 150–400)
RBC: 4.88 MIL/uL (ref 4.22–5.81)
RDW: 13.6 % (ref 11.5–15.5)
WBC: 14.8 10*3/uL — ABNORMAL HIGH (ref 4.0–10.5)
nRBC: 0 % (ref 0.0–0.2)

## 2018-11-25 LAB — SURGICAL PCR SCREEN
MRSA, PCR: NEGATIVE
Staphylococcus aureus: NEGATIVE

## 2018-11-25 LAB — PHENYTOIN LEVEL, TOTAL: Phenytoin Lvl: <2.5 ug/mL — ABNORMAL LOW (ref 10.0–20.0)

## 2018-11-25 MED ORDER — CEFAZOLIN SODIUM-DEXTROSE 2-4 GM/100ML-% IV SOLN
2.0000 g | INTRAVENOUS | Status: AC
  Administered 2018-11-26: 2 g via INTRAVENOUS
  Filled 2018-11-25: qty 100

## 2018-11-25 MED ORDER — FENTANYL CITRATE (PF) 100 MCG/2ML IJ SOLN
50.0000 ug | INTRAMUSCULAR | Status: DC | PRN
  Administered 2018-11-25: 50 ug via INTRAVENOUS
  Filled 2018-11-25: qty 2

## 2018-11-25 MED ORDER — LACOSAMIDE 50 MG PO TABS
150.0000 mg | ORAL_TABLET | Freq: Two times a day (BID) | ORAL | Status: DC
  Administered 2018-11-25 – 2018-11-29 (×7): 150 mg via ORAL
  Filled 2018-11-25 (×8): qty 3

## 2018-11-25 MED ORDER — CHLORHEXIDINE GLUCONATE 4 % EX LIQD
60.0000 mL | Freq: Once | CUTANEOUS | Status: AC
  Administered 2018-11-26: 4 via TOPICAL
  Filled 2018-11-25: qty 60

## 2018-11-25 MED ORDER — CEFAZOLIN SODIUM-DEXTROSE 2-4 GM/100ML-% IV SOLN: 2.0000 g | INTRAVENOUS | Status: DC

## 2018-11-25 MED ORDER — ASPIRIN EC 81 MG PO TBEC: 81.0000 mg | DELAYED_RELEASE_TABLET | Freq: Every day | ORAL | Status: DC

## 2018-11-25 MED ORDER — CHLORHEXIDINE GLUCONATE 4 % EX LIQD: 60.0000 mL | Freq: Once | CUTANEOUS | Status: DC

## 2018-11-25 MED ORDER — ONDANSETRON HCL 4 MG/2ML IJ SOLN: 4.0000 mg | Freq: Four times a day (QID) | INTRAMUSCULAR | Status: DC | PRN

## 2018-11-25 MED ORDER — METHOCARBAMOL 500 MG PO TABS
500.0000 mg | ORAL_TABLET | Freq: Four times a day (QID) | ORAL | Status: DC | PRN
  Administered 2018-11-25: 500 mg via ORAL
  Filled 2018-11-25: qty 1

## 2018-11-25 MED ORDER — LACTATED RINGERS IV SOLN
INTRAVENOUS | Status: DC
  Administered 2018-11-25 – 2018-11-26 (×2): via INTRAVENOUS

## 2018-11-25 MED ORDER — POLYETHYLENE GLYCOL 3350 17 G PO PACK
17.0000 g | PACK | Freq: Every day | ORAL | Status: DC
  Administered 2018-11-25 – 2018-11-29 (×4): 17 g via ORAL
  Filled 2018-11-25 (×4): qty 1

## 2018-11-25 MED ORDER — AMITRIPTYLINE HCL 50 MG PO TABS
200.0000 mg | ORAL_TABLET | Freq: Every day | ORAL | Status: DC
  Administered 2018-11-25 – 2018-11-28 (×4): 200 mg via ORAL
  Filled 2018-11-25 (×2): qty 4
  Filled 2018-11-25: qty 8
  Filled 2018-11-25 (×2): qty 4

## 2018-11-25 MED ORDER — LOSARTAN POTASSIUM 50 MG PO TABS
50.0000 mg | ORAL_TABLET | Freq: Every day | ORAL | Status: DC
  Administered 2018-11-28 – 2018-11-29 (×2): 50 mg via ORAL
  Filled 2018-11-25 (×3): qty 1

## 2018-11-25 MED ORDER — POVIDONE-IODINE 10 % EX SWAB: 2.0000 "application " | Freq: Once | CUTANEOUS | Status: DC

## 2018-11-25 MED ORDER — MUPIROCIN 2 % EX OINT
1.0000 "application " | TOPICAL_OINTMENT | Freq: Two times a day (BID) | CUTANEOUS | Status: DC
  Administered 2018-11-25 – 2018-11-29 (×7): 1 via NASAL
  Filled 2018-11-25 (×2): qty 22

## 2018-11-25 MED ORDER — METHOCARBAMOL 1000 MG/10ML IJ SOLN: 500.0000 mg | Freq: Four times a day (QID) | INTRAVENOUS | Status: DC | PRN

## 2018-11-25 MED ORDER — AMLODIPINE BESYLATE 5 MG PO TABS
5.0000 mg | ORAL_TABLET | Freq: Every day | ORAL | Status: DC
  Administered 2018-11-25 – 2018-11-28 (×4): 5 mg via ORAL
  Filled 2018-11-25 (×5): qty 1

## 2018-11-25 MED ORDER — MORPHINE SULFATE (PF) 4 MG/ML IV SOLN: 2.0000 mg | INTRAVENOUS | Status: DC | PRN

## 2018-11-25 MED ORDER — HYDROMORPHONE HCL 1 MG/ML IJ SOLN
1.0000 mg | Freq: Once | INTRAMUSCULAR | Status: AC
  Administered 2018-11-25: 1 mg via INTRAVENOUS
  Filled 2018-11-25 (×2): qty 1

## 2018-11-25 MED ORDER — HYDROCHLOROTHIAZIDE 25 MG PO TABS
25.0000 mg | ORAL_TABLET | Freq: Every day | ORAL | Status: DC
  Administered 2018-11-28 – 2018-11-29 (×2): 25 mg via ORAL
  Filled 2018-11-25 (×3): qty 1

## 2018-11-25 MED ORDER — DOCUSATE SODIUM 100 MG PO CAPS
100.0000 mg | ORAL_CAPSULE | Freq: Two times a day (BID) | ORAL | Status: DC
  Administered 2018-11-25 – 2018-11-29 (×7): 100 mg via ORAL
  Filled 2018-11-25 (×8): qty 1

## 2018-11-25 MED ORDER — ACETAMINOPHEN 500 MG PO TABS
1000.0000 mg | ORAL_TABLET | Freq: Four times a day (QID) | ORAL | Status: DC
  Administered 2018-11-25 – 2018-11-29 (×12): 1000 mg via ORAL
  Filled 2018-11-25 (×12): qty 2

## 2018-11-25 MED ORDER — OXYCODONE HCL 5 MG PO TABS
5.0000 mg | ORAL_TABLET | ORAL | Status: DC | PRN
  Administered 2018-11-25 – 2018-11-28 (×9): 10 mg via ORAL
  Filled 2018-11-25 (×9): qty 2

## 2018-11-25 MED ORDER — ADULT MULTIVITAMIN W/MINERALS CH
1.0000 | ORAL_TABLET | Freq: Every day | ORAL | Status: DC
  Administered 2018-11-27 – 2018-11-29 (×3): 1 via ORAL
  Filled 2018-11-25 (×3): qty 1

## 2018-11-25 MED ORDER — CLONAZEPAM 1 MG PO TABS
1.0000 mg | ORAL_TABLET | Freq: Every day | ORAL | Status: DC
  Administered 2018-11-25: 1 mg via ORAL
  Filled 2018-11-25: qty 2

## 2018-11-25 MED ORDER — ONDANSETRON HCL 4 MG/2ML IJ SOLN
4.0000 mg | Freq: Once | INTRAMUSCULAR | Status: AC
  Administered 2018-11-25: 4 mg via INTRAVENOUS
  Filled 2018-11-25: qty 2

## 2018-11-25 MED ORDER — ONDANSETRON HCL 4 MG PO TABS: 4.0000 mg | ORAL_TABLET | Freq: Four times a day (QID) | ORAL | Status: DC | PRN

## 2018-11-25 MED ORDER — METOPROLOL SUCCINATE ER 100 MG PO TB24
200.0000 mg | ORAL_TABLET | Freq: Every day | ORAL | Status: DC
  Administered 2018-11-26 – 2018-11-29 (×3): 200 mg via ORAL
  Filled 2018-11-25 (×4): qty 2

## 2018-11-25 MED ORDER — FENTANYL CITRATE (PF) 100 MCG/2ML IJ SOLN
50.0000 ug | Freq: Once | INTRAMUSCULAR | Status: AC
  Administered 2018-11-25: 50 ug via INTRAVENOUS
  Filled 2018-11-25: qty 2

## 2018-11-25 MED ORDER — ATORVASTATIN CALCIUM 40 MG PO TABS
40.0000 mg | ORAL_TABLET | Freq: Every day | ORAL | Status: DC
  Administered 2018-11-27 – 2018-11-29 (×3): 40 mg via ORAL
  Filled 2018-11-25 (×3): qty 1

## 2018-11-25 MED ORDER — PHENYTOIN SODIUM EXTENDED 100 MG PO CAPS
100.0000 mg | ORAL_CAPSULE | Freq: Every day | ORAL | Status: DC
  Administered 2018-11-25 – 2018-11-28 (×4): 100 mg via ORAL
  Filled 2018-11-25 (×5): qty 1

## 2018-11-25 MED ORDER — DIPHENHYDRAMINE HCL 12.5 MG/5ML PO ELIX: 12.5000 mg | ORAL_SOLUTION | ORAL | Status: DC | PRN

## 2018-11-25 MED ORDER — QUETIAPINE FUMARATE 400 MG PO TABS
600.0000 mg | ORAL_TABLET | Freq: Every day | ORAL | Status: DC
  Administered 2018-11-25 – 2018-11-28 (×4): 600 mg via ORAL
  Filled 2018-11-25 (×5): qty 2

## 2018-11-25 NOTE — ED Notes (Signed)
Bed: IA16 Expected date:  Expected time:  Means of arrival:  Comments: Hold for room 9

## 2018-11-25 NOTE — ED Notes (Signed)
Bed: [facility] Expected date:  Expected time:  Means of arrival:  Comments: EMS-fall 

## 2018-11-25 NOTE — Consult Note (Signed)
[name] Medical Consultation  HALVOR BEHREND YYT:[id number] DOB: [birth year] DOA: 11/25/2018 PCP: Leeanne Rio, MD   Requesting physician: Dr. Griffin Basil Date of consultation: 11/25/18 Reason for consultation: Medical clearance for surgery and assistance with medical management.  Impression/Recommendations 1-Fracture of tibial shaft, left, closed -Treatment as per primary service -Anticipating surgery on 11/26/2018 for surgical repair. -Patient examined and cleared for surgery; low risk and no further preoperative testing required.  Patient reports able to perform all his activities of daily living without any complaints of chest pain or shortness of breath.  EKG without acute ischemic changes and overall unremarkable blood work (except for leukocytosis in the setting of acute fracture; most likely demargination and a stress reaction). -Continue as needed pain medication for now.  2-essential hypertension -Appears to be stable and well-controlled -Resume home antihypertensive regimen -Will recommend heart healthy diet.  3-hyperlipidemia -Continue heart healthy diet and Lipitor  4-chronic diastolic heart failure -Compensated -Follow daily weights and strict intake and output -Continue current medications.  5-depression/anxiety/schizoaffective disorder -Stable mood -No suicidal ideation or hallucinations -Continue home psychiatry regimen (including Klonopin, Elavil and Seroquel).   6-seizure disorder -Continue Vimpat and phenytoin -Check phenytoin level -Patient denies any seizure activity recently.  7-constipation -Will use PRN Colace and MiraLAX -Maintain adequate hydration -Hold laxatives in the setting of diarrhea.  8-leukocytosis -no signs of infection -most likely demargination/stress from acute fracture -hold on abx's -follow WBC's trend   Our team will followup again tomorrow. Please contact internal medicine service [facility]), if we can be of assistance  in the meanwhile. Thank you for this consultation.  Chief Complaint: Mechanical fall, left foot pain and inability to bear weight.  HPI:  62 year old male with a past medical history significant for hypertension, hyperlipidemia, chronic diastolic heart failure, seizures, depression/anxiety and a schizoaffective disorder; who presented to the emergency department after experiencing mechanical fall at home with complaints of left foot pain and inability to bear weight.  Patient was found with closed tibial fracture.  He has been seen by orthopedic surgery and they are planning to perform surgical repair on 11/26/2018.  Try the hospital has been consulted for medical clearance and to assist with his chronic medical management.  Patient denies chest pain, shortness of breath, nausea, vomiting, dysuria, hematuria, abdominal pain, melena, hematochezia, headaches, blurred vision or focal weakness.  Review of Systems:  Negative except as otherwise mentioned in HPI.  Past Medical History:  Diagnosis Date  . Anxiety   . Constipation   . Depression   . Dyslipidemia   . GERD (gastroesophageal reflux disease)   . Hearing loss   . History of echocardiogram    Echo 8/18: EF 50-55, normal wall motion, grade 1 diastolic dysfunction, mild MR  . Hypertension   . Memory difficulty 10/25/2017  . Memory loss   . MRSA carrier   . Schizoaffective disorder   . Seizures ([place])    Past Surgical History:  Procedure Laterality Date  . CARPAL TUNNEL RELEASE    . COLONOSCOPY  2010  . ORIF SHOULDER FRACTURE    . self orchectomy    . SHOULDER CLOSED REDUCTION Left 09/16/2013   Procedure: CLOSED REDUCTION SHOULDER;  Surgeon: Mauri Pole, MD;  Location: [facility];  Service: Orthopedics;  Laterality: Left;  . SHOULDER HEMI-ARTHROPLASTY Left 09/18/2013   Procedure: LEFT SHOULDER HEMI-ARTHROPLASTY;  Surgeon: Augustin Schooling, MD;  Location: [place];  Service: Orthopedics;  Laterality: Left;   Social History:  reports  that he has never smoked.  He has never used smokeless tobacco. He reports that he does not drink alcohol or use drugs.  Allergies  Allergen Reactions  . Codeine Nausea And Vomiting  . Sulfa Antibiotics Nausea And Vomiting  . Sulfasalazine Nausea And Vomiting   Family History  Problem Relation Age of Onset  . Stroke Father        Living at 28  . Stroke Mother        Stroke in late 74's. Died in her 32's  . Alcohol abuse Brother   . Alcohol abuse Brother   . Diabetes Brother   . Colon cancer Neg Hx   . Esophageal cancer Neg Hx   . Stomach cancer Neg Hx   . Rectal cancer Neg Hx     Prior to Admission medications   Medication Sig Start Date End Date Taking? Authorizing Provider  amitriptyline (ELAVIL) 100 MG tablet Take 2 tablets (200 mg total) by mouth at bedtime. 10/22/18  Yes Arfeen, Arlyce Harman, MD  amLODipine (NORVASC) 5 MG tablet Take 1 tablet (5 mg total) by mouth daily. Patient taking differently: Take 5 mg by mouth at bedtime.  07/26/17  Yes Mikell, Jeani Sow, MD  aspirin EC 81 MG tablet Take 1 tablet (81 mg total) by mouth daily. Patient taking differently: Take 81 mg by mouth at bedtime.  07/21/15  Yes Withrow, Elyse Jarvis, FNP  atorvastatin (LIPITOR) 40 MG tablet Take 1 tablet (40 mg total) by mouth daily. Patient taking differently: Take 40 mg by mouth at bedtime.  04/10/18  Yes Leeanne Rio, MD  clonazePAM (KLONOPIN) 1 MG tablet Take 1 tablet (1 mg total) by mouth at bedtime. 10/22/18  Yes Arfeen, Arlyce Harman, MD  hydrochlorothiazide (HYDRODIURIL) 25 MG tablet Take 1 tablet (25 mg total) by mouth daily. Patient taking differently: Take 25 mg by mouth at bedtime.  08/11/18  Yes Alveda Reasons, MD  Lacosamide (VIMPAT) 150 MG TABS Take 1 tablet (150 mg total) by mouth 2 (two) times daily. 05/15/18  Yes Ward Givens, NP  losartan (COZAAR) 50 MG tablet TAKE 1 TABLET BY MOUTH EVERY DAY Patient taking differently: Take 50 mg by mouth at bedtime.  11/24/18  Yes Martinique, Peter M, MD   metoprolol succinate (TOPROL-XL) 100 MG 24 hr tablet TAKE 2 TABLETS (200 MG TOTAL) DAILY BY MOUTH. Patient taking differently: Take 200 mg by mouth at bedtime.  10/16/18  Yes Hensel, Jamal Collin, MD  Multiple Vitamin (MULTIVITAMIN WITH MINERALS) TABS tablet Take 1 tablet by mouth daily.   Yes [provider]  phenytoin (DILANTIN) 100 MG ER capsule Take 1 capsule (100 mg total) by mouth at bedtime. 09/12/18  Yes Kathrynn Ducking, MD  polyethylene glycol powder ([employer] PURELAX) powder TAKE 17 GRAMS 2 TIMES A DAY AS NEEDED FOR CONSTIPATION. MIX INTO 8 OF FLUID AND DRINK Patient taking differently: Take 17 g by mouth daily. Mix into 8 oz of fluid and drink 09/18/18  Yes Dickie La, MD  QUEtiapine (SEROQUEL) 300 MG tablet Take 2 tablets (600 mg total) by mouth at bedtime. 10/22/18  Yes Arfeen, Arlyce Harman, MD  testosterone cypionate (DEPOTESTOSTERONE CYPIONATE) 200 MG/ML injection Inject 0.75 mLs (150 mg total) into the muscle every 14 (fourteen) days. 09/12/16  Yes Leeanne Rio, MD  phenytoin (PHENYTOIN INFATABS) 50 MG tablet CHEW 1 TABLET BY MOUTH AT BEDTIME Patient not taking: Reported on 11/25/2018 05/15/18   Ward Givens, NP   Physical Exam: Blood pressure 103/67, pulse 65, temperature 98 F (36.7  C), temperature source Oral, resp. rate 20, height 5\' 11"  (1.803 m), weight 90.7 kg, SpO2 92 %. Vitals:   11/25/18 1208 11/25/18 1303  BP: 127/75 103/67  Pulse: 62 65  Resp: 16 20  Temp:    SpO2: 98% 92%     General: Alert, awake and oriented x3; afebrile, in no acute distress.  Able to follow commands appropriately and answering with good context all questions.  Eyes: PERRLA, extraocular muscles intact, no icterus, no nystagmus.  ENT: No ears or nostrils discharges,There is no exudates or thrush in his throat.  Neck: neck is supple, no JVD.   Cardiovascular: S1 and S2, no rubs, no gallops, no murmurs.  Respiratory: Good air movement bilaterally, normal respiratory  effort.  Abdomen: Soft, nontender, nondistended, positive bowel sounds.  Skin: No rashes, no petechiae; no open wounds.  Musculoskeletal: Left leg with a stabilizing brace in place; no cyanosis, no clubbing.  No other joint abnormalities appreciated.  Psychiatric: Appropriate mood, demonstrated to be capable on making his own decision.  Neurologic: Cranial nerves grossly intact, no focal deficits.  Able to move 4 limbs spontaneously.  Patient oriented x3.  Labs on Admission:  Basic Metabolic Panel: Recent Labs  Lab 11/25/18 1339  NA 137  K 4.5  CL 105  CO2 23  GLUCOSE 107*  BUN 28*  CREATININE 0.97  CALCIUM 8.3*   CBC: Recent Labs  Lab 11/25/18 1339  WBC 14.8*  NEUTROABS 11.8*  HGB 15.7  HCT 46.8  MCV 95.9  PLT 184    Radiological Exams on Admission: Dg Tibia/fibula Left  Result Date: 11/25/2018 CLINICAL DATA:  Left tibia and fibular pain status post fall this morning. EXAM: LEFT TIBIA AND FIBULA - 2 VIEW COMPARISON:  None. FINDINGS: The patient has sustained a spiral fracture of the distal tibial diaphysis exhibiting mild displacement. There is a spiral fracture of the proximal third of the fibula which is also displaced. The observed portions of the distal femur are normal. The malleoli are not well demonstrated on this study. IMPRESSION: Mildly displaced distal tibial shaft fracture. Moderately displaced proximal fibular shaft fracture. A left ankle series is recommended to assess the malleoli more completely. Electronically Signed   By: David  Martinique M.D.   On: 11/25/2018 09:37   Dg Ankle Complete Left  Result Date: 11/25/2018 CLINICAL DATA:  Left lower leg pain.  Known tib fib fracture EXAM: LEFT ANKLE COMPLETE - 3+ VIEW COMPARISON:  11/25/2018 FINDINGS: Comminuted, displaced distal metadiaphyseal fracture in the left tibia, stable since prior study. Previously seen proximal fibular fracture not visualized. Well corticated bone fragment at the tip of the medial  malleolus felt represent old injury or secondary ossification center. IMPRESSION: Comminuted, displaced distal tibial metadiaphyseal fracture. Electronically Signed   By: Rolm Baptise M.D.   On: 11/25/2018 10:27   Ct Ankle Left Wo Contrast  Result Date: 11/25/2018 CLINICAL DATA:  Trip and fall at 7 a.m. the patient suffered a trip and fall injury at 7 a.m. this morning resulting in a left lower leg fracture. Initial encounter. EXAM: CT OF THE LEFT ANKLE WITHOUT CONTRAST TECHNIQUE: Multidetector CT imaging of the left ankle was performed according to the standard protocol. Multiplanar CT image reconstructions were also generated. COMPARISON:  Plain films left ankle today. FINDINGS: Bones/Joint/Cartilage As seen on the comparison plain films, there is a spiral fracture of the distal left tibia. The fracture extends from 14.5 cm above the plafond and in a distal orientation with the main fracture line  exiting the medial cortex of the diaphysis 6 cm above the tip of the medial malleolus, just superior to the metaphysis. There is slight lateral displacement of the main fracture fragment at the fracture line through the medial diaphysis and the superior fracture margins are distracted up to 0.5 cm. Below the main fracture line through the medial cortex of the diaphysis, a nondisplaced fracture line continues through the posterior cortex of the tibia to the metaphysis. The patient also has a fracture of the lateral corner of the anterior distal tibia where fragment measuring 1 cm AP by 0.6 cm transverse by 1.1 cm craniocaudal is identified. This fracture fragment is at the attachment site of the anterior tibiofibular ligament. Tiny well corticated ossicle off the medial malleolus is noted. No other fracture is identified. Imaged fibula is intact. Ligaments Suboptimally assessed by CT. Muscles and Tendons Intact.  No tendon entrapment. Soft tissues Mild subcutaneous edema noted. IMPRESSION: Mildly displaced spiral  fracture of the distal tibia extends from the lateral cortex of the diaphysis through the medial cortex of the diaphysis just proximal to the metaphysis. Nondisplaced component of the fracture extending more distally in the posterior cortex of the tibia noted. Small fracture off the anterior aspect of the lateral cortex of the distal tibia at the expected attachment site of the anterior tibiofibular ligament. The syndesmosis is not widened. Electronically Signed   By: Inge Rise M.D.   On: 11/25/2018 12:45    EKG: Independently reviewed.  No acute ischemic changes, sinus rhythm.  Time spent: 55 minutes  [facility] Pager [phone]  If 7PM-7AM, please contact night-coverage [email or web address] Password [email or web address] 11/25/2018, 2:32 PM

## 2018-11-25 NOTE — H&P (Signed)
ORTHOPAEDIC H&P Chief Complaint: L tibia fracture, fibula fracture  HPI: [name] is a 62 y.o. male with ground level fall today resulting in a displaced tibia shaft fracture with some signs of involvement of the syndesmosis with distal tibial anterolateral avulsion.  No other areas of injury.  Evaluated by ER and determined to have isolated orthopedic injury.    Medicine saw patient in ER and deemed low risk for intervention.  Patient comfortable awaiting transport to [facility].  Patient reports history of well controlled seizure disorder, schizoaffective disorder, currently well controlled.  Denies CP, SOB, history of MI or stroke.    Past Medical History:  Diagnosis Date  . Anxiety   . Constipation   . Depression   . Dyslipidemia   . GERD (gastroesophageal reflux disease)   . Hearing loss   . History of echocardiogram    Echo 8/18: EF 50-55, normal wall motion, grade 1 diastolic dysfunction, mild MR  . Hypertension   . Memory difficulty 10/25/2017  . Memory loss   . MRSA carrier   . Schizoaffective disorder   . Seizures ([place])    Past Surgical History:  Procedure Laterality Date  . CARPAL TUNNEL RELEASE    . COLONOSCOPY  2010  . ORIF SHOULDER FRACTURE    . self orchectomy    . SHOULDER CLOSED REDUCTION Left 09/16/2013   Procedure: CLOSED REDUCTION SHOULDER;  Surgeon: Mauri Pole, MD;  Location: [facility];  Service: Orthopedics;  Laterality: Left;  . SHOULDER HEMI-ARTHROPLASTY Left 09/18/2013   Procedure: LEFT SHOULDER HEMI-ARTHROPLASTY;  Surgeon: Augustin Schooling, MD;  Location: [facility];  Service: Orthopedics;  Laterality: Left;   Social History   Socioeconomic History  . Marital status: Single    Spouse name: Not on file  . Number of children: 0  . Years of education: 80  . Highest education level: Not on file  Occupational History  . Occupation: disabled  Social Needs  . Financial resource strain: Not hard at all  . Food insecurity:    Worry: Never true   Inability: Never true  . Transportation needs:    Medical: No    Non-medical: No  Tobacco Use  . Smoking status: Never Smoker  . Smokeless tobacco: Never Used  Substance and Sexual Activity  . Alcohol use: No    Alcohol/week: 0.0 standard drinks  . Drug use: No  . Sexual activity: Not Currently    Partners: Female    Birth control/protection: None  Lifestyle  . Physical activity:    Days per week: 0 days    Minutes per session: 0 min  . Stress: Only a little  Relationships  . Social connections:    Talks on phone: More than three times a week    Gets together: More than three times a week    Attends religious service: Never    Active member of club or organization: No    Attends meetings of clubs or organizations: Never    Relationship status: Not on file  Other Topics Concern  . Not on file  Social History Narrative   Patient does not drink caffeine.   Patient is left handed.   Family History  Problem Relation Age of Onset  . Stroke Father        Living at 77  . Stroke Mother        Stroke in late 4's. Died in her 18's  . Alcohol abuse Brother   . Alcohol abuse  Brother   . Diabetes Brother   . Colon cancer Neg Hx   . Esophageal cancer Neg Hx   . Stomach cancer Neg Hx   . Rectal cancer Neg Hx    Allergies  Allergen Reactions  . Codeine Nausea And Vomiting  . Sulfa Antibiotics Nausea And Vomiting  . Sulfasalazine Nausea And Vomiting   Prior to Admission medications   Medication Sig Start Date End Date Taking? Authorizing Provider  amitriptyline (ELAVIL) 100 MG tablet Take 2 tablets (200 mg total) by mouth at bedtime. 10/22/18  Yes Arfeen, Arlyce Harman, MD  amLODipine (NORVASC) 5 MG tablet Take 1 tablet (5 mg total) by mouth daily. Patient taking differently: Take 5 mg by mouth at bedtime.  07/26/17  Yes Mikell, Jeani Sow, MD  aspirin EC 81 MG tablet Take 1 tablet (81 mg total) by mouth daily. Patient taking differently: Take 81 mg by mouth at bedtime.  07/21/15   Yes Withrow, Elyse Jarvis, FNP  atorvastatin (LIPITOR) 40 MG tablet Take 1 tablet (40 mg total) by mouth daily. Patient taking differently: Take 40 mg by mouth at bedtime.  04/10/18  Yes Leeanne Rio, MD  clonazePAM (KLONOPIN) 1 MG tablet Take 1 tablet (1 mg total) by mouth at bedtime. 10/22/18  Yes Arfeen, Arlyce Harman, MD  hydrochlorothiazide (HYDRODIURIL) 25 MG tablet Take 1 tablet (25 mg total) by mouth daily. Patient taking differently: Take 25 mg by mouth at bedtime.  08/11/18  Yes Alveda Reasons, MD  Lacosamide (VIMPAT) 150 MG TABS Take 1 tablet (150 mg total) by mouth 2 (two) times daily. 05/15/18  Yes Ward Givens, NP  losartan (COZAAR) 50 MG tablet TAKE 1 TABLET BY MOUTH EVERY DAY Patient taking differently: Take 50 mg by mouth at bedtime.  11/24/18  Yes Martinique, Peter M, MD  metoprolol succinate (TOPROL-XL) 100 MG 24 hr tablet TAKE 2 TABLETS (200 MG TOTAL) DAILY BY MOUTH. Patient taking differently: Take 200 mg by mouth at bedtime.  10/16/18  Yes Hensel, Jamal Collin, MD  Multiple Vitamin (MULTIVITAMIN WITH MINERALS) TABS tablet Take 1 tablet by mouth daily.   Yes [provider]  phenytoin (DILANTIN) 100 MG ER capsule Take 1 capsule (100 mg total) by mouth at bedtime. 09/12/18  Yes Kathrynn Ducking, MD  polyethylene glycol powder ([employer] PURELAX) powder TAKE 17 GRAMS 2 TIMES A DAY AS NEEDED FOR CONSTIPATION. MIX INTO 8 OF FLUID AND DRINK Patient taking differently: Take 17 g by mouth daily. Mix into 8 oz of fluid and drink 09/18/18  Yes Dickie La, MD  QUEtiapine (SEROQUEL) 300 MG tablet Take 2 tablets (600 mg total) by mouth at bedtime. 10/22/18  Yes Arfeen, Arlyce Harman, MD  testosterone cypionate (DEPOTESTOSTERONE CYPIONATE) 200 MG/ML injection Inject 0.75 mLs (150 mg total) into the muscle every 14 (fourteen) days. 09/12/16  Yes Leeanne Rio, MD  phenytoin (PHENYTOIN INFATABS) 50 MG tablet CHEW 1 TABLET BY MOUTH AT BEDTIME Patient not taking: Reported on 11/25/2018 05/15/18    Ward Givens, NP   Dg Tibia/fibula Left  Result Date: 11/25/2018 CLINICAL DATA:  Left tibia and fibular pain status post fall this morning. EXAM: LEFT TIBIA AND FIBULA - 2 VIEW COMPARISON:  None. FINDINGS: The patient has sustained a spiral fracture of the distal tibial diaphysis exhibiting mild displacement. There is a spiral fracture of the proximal third of the fibula which is also displaced. The observed portions of the distal femur are normal. The malleoli are not well demonstrated on this  study. IMPRESSION: Mildly displaced distal tibial shaft fracture. Moderately displaced proximal fibular shaft fracture. A left ankle series is recommended to assess the malleoli more completely. Electronically Signed   By: David  Martinique M.D.   On: 11/25/2018 09:37   Dg Ankle Complete Left  Result Date: 11/25/2018 CLINICAL DATA:  Left lower leg pain.  Known tib fib fracture EXAM: LEFT ANKLE COMPLETE - 3+ VIEW COMPARISON:  11/25/2018 FINDINGS: Comminuted, displaced distal metadiaphyseal fracture in the left tibia, stable since prior study. Previously seen proximal fibular fracture not visualized. Well corticated bone fragment at the tip of the medial malleolus felt represent old injury or secondary ossification center. IMPRESSION: Comminuted, displaced distal tibial metadiaphyseal fracture. Electronically Signed   By: Rolm Baptise M.D.   On: 11/25/2018 10:27   Ct Ankle Left Wo Contrast  Result Date: 11/25/2018 CLINICAL DATA:  Trip and fall at 7 a.m. the patient suffered a trip and fall injury at 7 a.m. this morning resulting in a left lower leg fracture. Initial encounter. EXAM: CT OF THE LEFT ANKLE WITHOUT CONTRAST TECHNIQUE: Multidetector CT imaging of the left ankle was performed according to the standard protocol. Multiplanar CT image reconstructions were also generated. COMPARISON:  Plain films left ankle today. FINDINGS: Bones/Joint/Cartilage As seen on the comparison plain films, there is a spiral  fracture of the distal left tibia. The fracture extends from 14.5 cm above the plafond and in a distal orientation with the main fracture line exiting the medial cortex of the diaphysis 6 cm above the tip of the medial malleolus, just superior to the metaphysis. There is slight lateral displacement of the main fracture fragment at the fracture line through the medial diaphysis and the superior fracture margins are distracted up to 0.5 cm. Below the main fracture line through the medial cortex of the diaphysis, a nondisplaced fracture line continues through the posterior cortex of the tibia to the metaphysis. The patient also has a fracture of the lateral corner of the anterior distal tibia where fragment measuring 1 cm AP by 0.6 cm transverse by 1.1 cm craniocaudal is identified. This fracture fragment is at the attachment site of the anterior tibiofibular ligament. Tiny well corticated ossicle off the medial malleolus is noted. No other fracture is identified. Imaged fibula is intact. Ligaments Suboptimally assessed by CT. Muscles and Tendons Intact.  No tendon entrapment. Soft tissues Mild subcutaneous edema noted. IMPRESSION: Mildly displaced spiral fracture of the distal tibia extends from the lateral cortex of the diaphysis through the medial cortex of the diaphysis just proximal to the metaphysis. Nondisplaced component of the fracture extending more distally in the posterior cortex of the tibia noted. Small fracture off the anterior aspect of the lateral cortex of the distal tibia at the expected attachment site of the anterior tibiofibular ligament. The syndesmosis is not widened. Electronically Signed   By: Inge Rise M.D.   On: 11/25/2018 12:45   Family History Reviewed and non-contributory, no pertinent history of problems with bleeding or anesthesia      Review of Systems 14 system ROS conducted and negative except for that noted in HPI   OBJECTIVE  Vitals: Patient Vitals for the past  8 hrs:  BP Temp Temp src Pulse Resp SpO2 Height Weight  11/25/18 1303 103/67 - - 65 20 92 % - -  11/25/18 1208 127/75 - - 62 16 98 % - -  11/25/18 0913 97/64 98 F (36.7 C) Oral (!) 58 17 97 % - -  11/25/18 4431 - - - - - -  5' 11"  (1.803 m) 90.7 kg  11/25/18 0854 - - - - - 96 % - -   General: Alert, no acute distress Cardiovascular: No pedal edema Respiratory: No cyanosis, no use of accessory musculature GI: No organomegaly, abdomen is soft and non-tender\ Skin: No lesions in the area of chief complaint other than those listed below in MSK exam.  Neurologic: Sensation intact distally save for the below mentioned MSK exam Psychiatric: Patient is competent for consent with normal mood and affect Lymphatic: No significant swelling noted outside of the below physical exam Extremities  EEF:EOFHQR CDI, +EHL though remainder of motor difficult to test due to splint, sensation intact distally with warm well perfused foot, no pain w passive stretch     Test Results Imaging XR and CT reviewed demonstrating distal fracture of the tibia with extension 2.5 cm from joint.  Patient with anterolateral avulsion from tibia as well, unclear chronicity  Labs cbc Recent Labs    11/25/18 1339  WBC 14.8*  HGB 15.7  HCT 46.8  PLT 184    Labs inflam No results for input(s): CRP in the last 72 hours.  Invalid input(s): ESR  Labs coag No results for input(s): INR, PTT in the last 72 hours.  Invalid input(s): PT  Recent Labs    11/25/18 1339  NA 137  K 4.5  CL 105  CO2 23  GLUCOSE 107*  BUN 28*  CREATININE 0.97  CALCIUM 8.3*     ASSESSMENT AND PLAN: 62 y.o. male with the following: Left tibia fracture with high fibula and question syndesmosis injury.    This patient requires inpatient admission to manage this problem appropriately.  Patients fracture would benefit from surgical management with IMN for minimizing risk of non-union, malunion and early mobilization. Additionally in  setting of possible syndesmosis injury may require further intervention in that setting.  Plan for IMN with stress test and evaluation of syndesmosis.  The risks benefits and alternatives were discussed with the patient including but not limited to the risks of nonoperative treatment, versus surgical intervention including infection, bleeding, nerve injury, periprosthetic fracture, the need for revision surgery, leg length discrepancy, gait change, blood clots, cardiopulmonary complications, morbidity, mortality, among others, and they were willing to proceed.      - Weight Bearing Status/Activity: NWB x6 weeks LLE in splint/cast or boot  - Additional recommended labs/tests: None  -VTE Prophylaxis: SCD overnight, likely aspirin postop  - Pain control: prn meds, miminimize narcotics  - Follow-up plan: 1 week postop  -Procedures: surgery scheduled tomorrow

## 2018-11-25 NOTE — ED Notes (Signed)
[facility] called for transport. 

## 2018-11-25 NOTE — ED Provider Notes (Signed)
[place] DEPT Provider Note   CSN: [id number] Arrival date & time: 11/25/18  0254     History   Chief Complaint Chief Complaint  Patient presents with  . Fall  . L ankle pain    HPI [name] is a 62 y.o. male.  Pt presents to the ED today with left ankle pain.  He has black out curtains in his bedroom.  He was walking back from the bathroom and tripped over something he did not see.  He has pain only in his left ankle.  He was unable to ambulate.  He denies hitting his head or loc.  He is not on blood thinners.     Past Medical History:  Diagnosis Date  . Anxiety   . Constipation   . Depression   . Dyslipidemia   . GERD (gastroesophageal reflux disease)   . Hearing loss   . History of echocardiogram    Echo 8/18: EF 50-55, normal wall motion, grade 1 diastolic dysfunction, mild MR  . Hypertension   . Memory difficulty 10/25/2017  . Memory loss   . MRSA carrier   . Schizoaffective disorder   . Seizures [facility])     Patient Active Problem List   Diagnosis Date Noted  . Closed tibia fracture 11/25/2018  . Fracture of tibial shaft, left, closed 11/25/2018  . Unintentional weight loss 04/07/2018  . Syncope and collapse 12/13/2017  . Memory difficulty 10/25/2017  . Presbyacusia 07/26/2017  . Palpitations 07/01/2017  . Acute bronchiolitis due to human metapneumovirus   . Mild sleep apnea 06/15/2016  . Actinic keratosis 06/15/2016  . Nocturnal dyspnea 02/03/2016  . Skin lesion of face 02/03/2016  . Paresthesia of both hands 09/16/2015  . Phenytoin toxicity   . TIA (transient ischemic attack) 07/29/2015  . Vertigo 07/29/2015  . Schizoaffective disorder, depressive type with good prognostic features ([place]) 07/13/2015  . Verruca 05/18/2015  . Seborrheic dermatitis of scalp 05/18/2015  . MDD (major depressive disorder), recurrent severe, without psychosis ([place])   . OCD (obsessive compulsive disorder)   . Seizures ([place])   .  Schizoaffective disorder, depressive type ([place])   . Orthostatic hypotension   . Fall   . Seizure ([place]) 11/18/2014  . Schizoaffective disorder, unspecified type ([place])   . Essential hypertension   . Overdose of beta-adrenergic antagonist drug 10/22/2014  . Overdose   . Nocturnal enuresis 03/23/2014  . Benzodiazepine dependence, continuous ([place]) 01/27/2014  . Severe major depression ([place]) 01/21/2014  . Frozen shoulder syndrome 09/20/2013  . Somnolence 09/20/2013  . Schizoaffective disorder ([place]) 09/20/2013  . Presbyopia 04/25/2012  . Androgen deficiency 09/07/2011  . Constipation, chronic 09/07/2011  . Other specified disorders of liver 12/30/2008  . Overweight(278.02) 09/08/2008  . Hyperlipidemia 01/28/2008  . Seizure disorder ([place]) 01/28/2008  . OSTEOPENIA 07/01/2007    Past Surgical History:  Procedure Laterality Date  . CARPAL TUNNEL RELEASE    . COLONOSCOPY  2010  . ORIF SHOULDER FRACTURE    . self orchectomy    . SHOULDER CLOSED REDUCTION Left 09/16/2013   Procedure: CLOSED REDUCTION SHOULDER;  Surgeon: Mauri Pole, MD;  Location: [facility];  Service: Orthopedics;  Laterality: Left;  . SHOULDER HEMI-ARTHROPLASTY Left 09/18/2013   Procedure: LEFT SHOULDER HEMI-ARTHROPLASTY;  Surgeon: Augustin Schooling, MD;  Location: [place];  Service: Orthopedics;  Laterality: Left;        Home Medications    Prior to Admission medications   Medication Sig Start Date End Date  Taking? Authorizing Provider  amitriptyline (ELAVIL) 100 MG tablet Take 2 tablets (200 mg total) by mouth at bedtime. 10/22/18  Yes Arfeen, Arlyce Harman, MD  amLODipine (NORVASC) 5 MG tablet Take 1 tablet (5 mg total) by mouth daily. Patient taking differently: Take 5 mg by mouth at bedtime.  07/26/17  Yes Mikell, Jeani Sow, MD  aspirin EC 81 MG tablet Take 1 tablet (81 mg total) by mouth daily. Patient taking differently: Take 81 mg by mouth at bedtime.  07/21/15  Yes Withrow, Elyse Jarvis, FNP  atorvastatin (LIPITOR) 40 MG  tablet Take 1 tablet (40 mg total) by mouth daily. Patient taking differently: Take 40 mg by mouth at bedtime.  04/10/18  Yes Leeanne Rio, MD  clonazePAM (KLONOPIN) 1 MG tablet Take 1 tablet (1 mg total) by mouth at bedtime. 10/22/18  Yes Arfeen, Arlyce Harman, MD  hydrochlorothiazide (HYDRODIURIL) 25 MG tablet Take 1 tablet (25 mg total) by mouth daily. Patient taking differently: Take 25 mg by mouth at bedtime.  08/11/18  Yes Alveda Reasons, MD  Lacosamide (VIMPAT) 150 MG TABS Take 1 tablet (150 mg total) by mouth 2 (two) times daily. 05/15/18  Yes Ward Givens, NP  losartan (COZAAR) 50 MG tablet TAKE 1 TABLET BY MOUTH EVERY DAY Patient taking differently: Take 50 mg by mouth at bedtime.  11/24/18  Yes Martinique, Peter M, MD  metoprolol succinate (TOPROL-XL) 100 MG 24 hr tablet TAKE 2 TABLETS (200 MG TOTAL) DAILY BY MOUTH. Patient taking differently: Take 200 mg by mouth at bedtime.  10/16/18  Yes Hensel, Jamal Collin, MD  Multiple Vitamin (MULTIVITAMIN WITH MINERALS) TABS tablet Take 1 tablet by mouth daily.   Yes [provider]  phenytoin (DILANTIN) 100 MG ER capsule Take 1 capsule (100 mg total) by mouth at bedtime. 09/12/18  Yes Kathrynn Ducking, MD  polyethylene glycol powder ([employer] PURELAX) powder TAKE 17 GRAMS 2 TIMES A DAY AS NEEDED FOR CONSTIPATION. MIX INTO 8 OF FLUID AND DRINK Patient taking differently: Take 17 g by mouth daily. Mix into 8 oz of fluid and drink 09/18/18  Yes Dickie La, MD  QUEtiapine (SEROQUEL) 300 MG tablet Take 2 tablets (600 mg total) by mouth at bedtime. 10/22/18  Yes Arfeen, Arlyce Harman, MD  testosterone cypionate (DEPOTESTOSTERONE CYPIONATE) 200 MG/ML injection Inject 0.75 mLs (150 mg total) into the muscle every 14 (fourteen) days. 09/12/16  Yes Leeanne Rio, MD  phenytoin (PHENYTOIN INFATABS) 50 MG tablet CHEW 1 TABLET BY MOUTH AT BEDTIME Patient not taking: Reported on 11/25/2018 05/15/18   Ward Givens, NP    Family History Family History    Problem Relation Age of Onset  . Stroke Father        Living at 93  . Stroke Mother        Stroke in late 65's. Died in her 75's  . Alcohol abuse Brother   . Alcohol abuse Brother   . Diabetes Brother   . Colon cancer Neg Hx   . Esophageal cancer Neg Hx   . Stomach cancer Neg Hx   . Rectal cancer Neg Hx     Social History Social History   Tobacco Use  . Smoking status: Never Smoker  . Smokeless tobacco: Never Used  Substance Use Topics  . Alcohol use: No    Alcohol/week: 0.0 standard drinks  . Drug use: No     Allergies   Codeine; Sulfa antibiotics; and Sulfasalazine   Review of Systems Review of Systems  Musculoskeletal:       Left ankle pain  All other systems reviewed and are negative.    Physical Exam Updated Vital Signs BP 103/67 (BP Location: Right Arm)   Pulse 65   Temp 98 F (36.7 C) (Oral)   Resp 20   Ht 5\' 11"  (1.803 m)   Wt 90.7 kg   SpO2 92%   BMI 27.89 kg/m   Physical Exam  Constitutional: He is oriented to person, place, and time. He appears well-developed and well-nourished.  HENT:  Head: Normocephalic and atraumatic.  Right Ear: External ear normal.  Left Ear: External ear normal.  Nose: Nose normal.  Mouth/Throat: Oropharynx is clear and moist.  Eyes: Pupils are equal, round, and reactive to light. Conjunctivae and EOM are normal.  Neck: Normal range of motion. Neck supple.  Cardiovascular: Normal rate, regular rhythm, normal heart sounds and intact distal pulses.  Pulmonary/Chest: Effort normal and breath sounds normal.  Abdominal: Soft. Bowel sounds are normal.  Musculoskeletal:       Left ankle: Tenderness. Medial malleolus tenderness found.  Neurological: He is alert and oriented to person, place, and time.  Skin: Skin is warm. Capillary refill takes less than 2 seconds.  Psychiatric: He has a normal mood and affect. His behavior is normal. Judgment and thought content normal.  Nursing note and vitals reviewed.    ED  Treatments / Results  Labs (all labs ordered are listed, but only abnormal results are displayed) Labs Reviewed  BASIC METABOLIC PANEL - Abnormal; Notable for the following components:      Result Value   Glucose, Bld 107 (*)    BUN 28 (*)    Calcium 8.3 (*)    All other components within normal limits  CBC WITH DIFFERENTIAL/PLATELET - Abnormal; Notable for the following components:   WBC 14.8 (*)    Neutro Abs 11.8 (*)    Abs Immature Granulocytes 0.08 (*)    All other components within normal limits  PHENYTOIN LEVEL, TOTAL    EKG EKG Interpretation  Date/Time:  Tuesday November 25 2018 13:05:02 EST Ventricular Rate:  65 PR Interval:    QRS Duration: 116 QT Interval:  434 QTC Calculation: 452 R Axis:   -24 Text Interpretation:  Sinus rhythm Incomplete left bundle branch block No significant change since last tracing Confirmed by Isla Pence [phone]) on 11/25/2018 1:09:59 PM   Radiology Dg Tibia/fibula Left  Result Date: 11/25/2018 CLINICAL DATA:  Left tibia and fibular pain status post fall this morning. EXAM: LEFT TIBIA AND FIBULA - 2 VIEW COMPARISON:  None. FINDINGS: The patient has sustained a spiral fracture of the distal tibial diaphysis exhibiting mild displacement. There is a spiral fracture of the proximal third of the fibula which is also displaced. The observed portions of the distal femur are normal. The malleoli are not well demonstrated on this study. IMPRESSION: Mildly displaced distal tibial shaft fracture. Moderately displaced proximal fibular shaft fracture. A left ankle series is recommended to assess the malleoli more completely. Electronically Signed   By: David  Martinique M.D.   On: 11/25/2018 09:37   Dg Ankle Complete Left  Result Date: 11/25/2018 CLINICAL DATA:  Left lower leg pain.  Known tib fib fracture EXAM: LEFT ANKLE COMPLETE - 3+ VIEW COMPARISON:  11/25/2018 FINDINGS: Comminuted, displaced distal metadiaphyseal fracture in the left tibia, stable  since prior study. Previously seen proximal fibular fracture not visualized. Well corticated bone fragment at the tip of the medial malleolus felt represent old injury  or secondary ossification center. IMPRESSION: Comminuted, displaced distal tibial metadiaphyseal fracture. Electronically Signed   By: Rolm Baptise M.D.   On: 11/25/2018 10:27   Ct Ankle Left Wo Contrast  Result Date: 11/25/2018 CLINICAL DATA:  Trip and fall at 7 a.m. the patient suffered a trip and fall injury at 7 a.m. this morning resulting in a left lower leg fracture. Initial encounter. EXAM: CT OF THE LEFT ANKLE WITHOUT CONTRAST TECHNIQUE: Multidetector CT imaging of the left ankle was performed according to the standard protocol. Multiplanar CT image reconstructions were also generated. COMPARISON:  Plain films left ankle today. FINDINGS: Bones/Joint/Cartilage As seen on the comparison plain films, there is a spiral fracture of the distal left tibia. The fracture extends from 14.5 cm above the plafond and in a distal orientation with the main fracture line exiting the medial cortex of the diaphysis 6 cm above the tip of the medial malleolus, just superior to the metaphysis. There is slight lateral displacement of the main fracture fragment at the fracture line through the medial diaphysis and the superior fracture margins are distracted up to 0.5 cm. Below the main fracture line through the medial cortex of the diaphysis, a nondisplaced fracture line continues through the posterior cortex of the tibia to the metaphysis. The patient also has a fracture of the lateral corner of the anterior distal tibia where fragment measuring 1 cm AP by 0.6 cm transverse by 1.1 cm craniocaudal is identified. This fracture fragment is at the attachment site of the anterior tibiofibular ligament. Tiny well corticated ossicle off the medial malleolus is noted. No other fracture is identified. Imaged fibula is intact. Ligaments Suboptimally assessed by CT.  Muscles and Tendons Intact.  No tendon entrapment. Soft tissues Mild subcutaneous edema noted. IMPRESSION: Mildly displaced spiral fracture of the distal tibia extends from the lateral cortex of the diaphysis through the medial cortex of the diaphysis just proximal to the metaphysis. Nondisplaced component of the fracture extending more distally in the posterior cortex of the tibia noted. Small fracture off the anterior aspect of the lateral cortex of the distal tibia at the expected attachment site of the anterior tibiofibular ligament. The syndesmosis is not widened. Electronically Signed   By: Inge Rise M.D.   On: 11/25/2018 12:45    Procedures Procedures (including critical care time)  Medications Ordered in ED Medications  fentaNYL (SUBLIMAZE) injection 50 mcg (has no administration in time range)  lactated ringers infusion (has no administration in time range)  acetaminophen (TYLENOL) tablet 1,000 mg (has no administration in time range)  oxyCODONE (Oxy IR/ROXICODONE) immediate release tablet 5-10 mg (has no administration in time range)  morphine 4 MG/ML injection 2 mg (has no administration in time range)  methocarbamol (ROBAXIN) tablet 500 mg (has no administration in time range)    Or  methocarbamol (ROBAXIN) 500 mg in dextrose 5 % 50 mL IVPB (has no administration in time range)  diphenhydrAMINE (BENADRYL) 12.5 MG/5ML elixir 12.5-25 mg (has no administration in time range)  docusate sodium (COLACE) capsule 100 mg (has no administration in time range)  ondansetron (ZOFRAN) tablet 4 mg (has no administration in time range)    Or  ondansetron (ZOFRAN) injection 4 mg (has no administration in time range)  ondansetron (ZOFRAN) injection 4 mg (4 mg Intravenous Given 11/25/18 1004)  fentaNYL (SUBLIMAZE) injection 50 mcg (50 mcg Intravenous Given 11/25/18 1005)  HYDROmorphone (DILAUDID) injection 1 mg (1 mg Intravenous Given 11/25/18 1136)     Initial Impression / Assessment  and  Plan / ED Course  I have reviewed the triage vital signs and the nursing notes.  Pertinent labs & imaging results that were available during my care of the patient were reviewed by me and considered in my medical decision making (see chart for details).     Pt d/w Dr. Griffin Basil (ortho).  He requests that pt be admitted to [facility].  He asks that we put a short leg splint and knee immobilizer on patient.  He requests a CT ankle.  These things were done.  He also requests hospitalist consult to clear for surgery.  He plans to operate tomorrow barring any complications.  Pt d/w Dr. Dyann Kief (hospitalist) who will see pt in consult.  Final Clinical Impressions(s) / ED Diagnoses   Final diagnoses:  Closed fracture of proximal end of left fibula, unspecified fracture morphology, initial encounter  Closed displaced spiral fracture of shaft of left tibia, initial encounter    ED Discharge Orders    None       Isla Pence, MD 11/25/18 1430

## 2018-11-25 NOTE — ED Triage Notes (Signed)
Per EMS: Pt from home.  Pt got up last night and tripped and fell in the dark (at 7 am).  EMS arrived at 8 am.  Pt c/o of pain in L ankle on palpation.  Pt's initial BP 85/54, which increased 122/61 after about 100 cc NS.

## 2018-11-25 NOTE — ED Notes (Signed)
Phlebotomy consulted, unable to obtain blood

## 2018-11-25 NOTE — Plan of Care (Signed)

## 2018-11-25 NOTE — Progress Notes (Signed)
Pt arrived to room 5N16 on stretcher via [facility]. Received report from Kerin Ransom, [place] at [facility] ED. See assessment. Will continue to monitor.

## 2018-11-26 ENCOUNTER — Inpatient Hospital Stay (HOSPITAL_COMMUNITY): Payer: Medicaid Other | Admitting: Registered Nurse

## 2018-11-26 ENCOUNTER — Inpatient Hospital Stay (HOSPITAL_COMMUNITY): Payer: Medicaid Other

## 2018-11-26 ENCOUNTER — Encounter (HOSPITAL_COMMUNITY)
Admission: EM | Disposition: A | Discharge status: Home Health | Payer: Self-pay | Source: Home / Self Care | Attending: Orthopaedic Surgery

## 2018-11-26 DIAGNOSIS — S82832A Other fracture of upper and lower end of left fibula, initial encounter for closed fracture: Secondary | ICD-10-CM

## 2018-11-26 DIAGNOSIS — F332 Major depressive disorder, recurrent severe without psychotic features: Secondary | ICD-10-CM

## 2018-11-26 DIAGNOSIS — F259 Schizoaffective disorder, unspecified: Secondary | ICD-10-CM

## 2018-11-26 DIAGNOSIS — I10 Essential (primary) hypertension: Secondary | ICD-10-CM

## 2018-11-26 DIAGNOSIS — E785 Hyperlipidemia, unspecified: Secondary | ICD-10-CM

## 2018-11-26 DIAGNOSIS — W19XXXD Unspecified fall, subsequent encounter: Secondary | ICD-10-CM

## 2018-11-26 DIAGNOSIS — G40909 Epilepsy, unspecified, not intractable, without status epilepticus: Secondary | ICD-10-CM

## 2018-11-26 DIAGNOSIS — K5909 Other constipation: Secondary | ICD-10-CM

## 2018-11-26 HISTORY — PX: TIBIA IM NAIL INSERTION: SHX2516

## 2018-11-26 HISTORY — PX: ORIF ANKLE FRACTURE: SHX5408

## 2018-11-26 LAB — CBC
HCT: 44.3 % (ref 39.0–52.0)
Hemoglobin: 14.7 g/dL (ref 13.0–17.0)
MCH: 31.3 pg (ref 26.0–34.0)
MCHC: 33.2 g/dL (ref 30.0–36.0)
MCV: 94.5 fL (ref 80.0–100.0)
Platelets: 171 10*3/uL (ref 150–400)
RBC: 4.69 MIL/uL (ref 4.22–5.81)
RDW: 13.5 % (ref 11.5–15.5)
WBC: 9.2 10*3/uL (ref 4.0–10.5)
nRBC: 0 % (ref 0.0–0.2)

## 2018-11-26 LAB — BASIC METABOLIC PANEL
Anion gap: 10 (ref 5–15)
BUN: 19 mg/dL (ref 8–23)
CO2: 19 mmol/L — ABNORMAL LOW (ref 22–32)
Calcium: 7.8 mg/dL — ABNORMAL LOW (ref 8.9–10.3)
Chloride: 106 mmol/L (ref 98–111)
Creatinine, Ser: 0.9 mg/dL (ref 0.61–1.24)
GFR calc Af Amer: >60 mL/min (ref >60)
GFR calc non Af Amer: >60 mL/min (ref >60)
Glucose, Bld: 88 mg/dL (ref 70–99)
Potassium: 4.6 mmol/L (ref 3.5–5.1)
Sodium: 135 mmol/L (ref 135–145)

## 2018-11-26 SURGERY — INSERTION, INTRAMEDULLARY ROD, TIBIA
Anesthesia: General | Site: Leg Lower | Laterality: Left

## 2018-11-26 MED ORDER — HYDROMORPHONE HCL 1 MG/ML IJ SOLN
0.2500 mg | INTRAMUSCULAR | Status: DC | PRN
  Administered 2018-11-26 (×4): 0.5 mg via INTRAVENOUS

## 2018-11-26 MED ORDER — EPHEDRINE SULFATE-NACL 50-0.9 MG/10ML-% IV SOSY
PREFILLED_SYRINGE | INTRAVENOUS | Status: DC | PRN
  Administered 2018-11-26 (×2): 10 mg via INTRAVENOUS
  Administered 2018-11-26: 15 mg via INTRAVENOUS
  Administered 2018-11-26: 10 mg via INTRAVENOUS

## 2018-11-26 MED ORDER — EPHEDRINE 5 MG/ML INJ
INTRAVENOUS | Status: AC
  Filled 2018-11-26: qty 10

## 2018-11-26 MED ORDER — MIDAZOLAM HCL 2 MG/2ML IJ SOLN
INTRAMUSCULAR | Status: AC
  Filled 2018-11-26: qty 2

## 2018-11-26 MED ORDER — METOCLOPRAMIDE HCL 5 MG/ML IJ SOLN: 5.0000 mg | Freq: Three times a day (TID) | INTRAMUSCULAR | Status: DC | PRN

## 2018-11-26 MED ORDER — DEXAMETHASONE SODIUM PHOSPHATE 10 MG/ML IJ SOLN
INTRAMUSCULAR | Status: AC
  Filled 2018-11-26: qty 1

## 2018-11-26 MED ORDER — 0.9 % SODIUM CHLORIDE (POUR BTL) OPTIME
TOPICAL | Status: DC | PRN
  Administered 2018-11-26: 1000 mL

## 2018-11-26 MED ORDER — MIDAZOLAM HCL 2 MG/2ML IJ SOLN
INTRAMUSCULAR | Status: DC | PRN
  Administered 2018-11-26: 2 mg via INTRAVENOUS

## 2018-11-26 MED ORDER — KETAMINE HCL 50 MG/5ML IJ SOSY
PREFILLED_SYRINGE | INTRAMUSCULAR | Status: AC
  Filled 2018-11-26: qty 5

## 2018-11-26 MED ORDER — VANCOMYCIN HCL 1000 MG IV SOLR
INTRAVENOUS | Status: AC
  Filled 2018-11-26: qty 1000

## 2018-11-26 MED ORDER — ONDANSETRON HCL 4 MG/2ML IJ SOLN
INTRAMUSCULAR | Status: DC | PRN
  Administered 2018-11-26: 4 mg via INTRAVENOUS

## 2018-11-26 MED ORDER — HYDROMORPHONE HCL 1 MG/ML IJ SOLN
INTRAMUSCULAR | Status: AC
  Filled 2018-11-26: qty 1

## 2018-11-26 MED ORDER — LIDOCAINE 2% (20 MG/ML) 5 ML SYRINGE
INTRAMUSCULAR | Status: DC | PRN
  Administered 2018-11-26: 60 mg via INTRAVENOUS

## 2018-11-26 MED ORDER — LACTATED RINGERS IV SOLN
INTRAVENOUS | Status: DC
  Administered 2018-11-26: 10:00:00 via INTRAVENOUS

## 2018-11-26 MED ORDER — PROMETHAZINE HCL 25 MG/ML IJ SOLN: 6.2500 mg | INTRAMUSCULAR | Status: DC | PRN

## 2018-11-26 MED ORDER — DEXAMETHASONE SODIUM PHOSPHATE 10 MG/ML IJ SOLN
INTRAMUSCULAR | Status: DC | PRN
  Administered 2018-11-26: 10 mg via INTRAVENOUS

## 2018-11-26 MED ORDER — CEFAZOLIN SODIUM-DEXTROSE 2-4 GM/100ML-% IV SOLN
2.0000 g | Freq: Four times a day (QID) | INTRAVENOUS | Status: AC
  Administered 2018-11-26 (×2): 2 g via INTRAVENOUS
  Filled 2018-11-26 (×2): qty 100

## 2018-11-26 MED ORDER — MENTHOL 3 MG MT LOZG: 1.0000 | LOZENGE | OROMUCOSAL | Status: DC | PRN

## 2018-11-26 MED ORDER — PROPOFOL 10 MG/ML IV BOLUS
INTRAVENOUS | Status: DC | PRN
  Administered 2018-11-26: 200 mg via INTRAVENOUS

## 2018-11-26 MED ORDER — ONDANSETRON HCL 4 MG/2ML IJ SOLN
INTRAMUSCULAR | Status: AC
  Filled 2018-11-26: qty 2

## 2018-11-26 MED ORDER — VANCOMYCIN HCL 1000 MG IV SOLR
INTRAVENOUS | Status: DC | PRN
  Administered 2018-11-26: 1000 mg via TOPICAL

## 2018-11-26 MED ORDER — METOCLOPRAMIDE HCL 5 MG PO TABS: 5.0000 mg | ORAL_TABLET | Freq: Three times a day (TID) | ORAL | Status: DC | PRN

## 2018-11-26 MED ORDER — ASPIRIN EC 325 MG PO TBEC
325.0000 mg | DELAYED_RELEASE_TABLET | Freq: Every day | ORAL | Status: DC
  Administered 2018-11-27 – 2018-11-29 (×3): 325 mg via ORAL
  Filled 2018-11-26 (×3): qty 1

## 2018-11-26 MED ORDER — FENTANYL CITRATE (PF) 100 MCG/2ML IJ SOLN
INTRAMUSCULAR | Status: DC | PRN
  Administered 2018-11-26: 25 ug via INTRAVENOUS
  Administered 2018-11-26: 50 ug via INTRAVENOUS

## 2018-11-26 MED ORDER — LIDOCAINE 2% (20 MG/ML) 5 ML SYRINGE
INTRAMUSCULAR | Status: AC
  Filled 2018-11-26: qty 5

## 2018-11-26 MED ORDER — PROPOFOL 10 MG/ML IV BOLUS
INTRAVENOUS | Status: AC
  Filled 2018-11-26: qty 20

## 2018-11-26 MED ORDER — PHENOL 1.4 % MT LIQD: 1.0000 | OROMUCOSAL | Status: DC | PRN

## 2018-11-26 MED ORDER — FENTANYL CITRATE (PF) 250 MCG/5ML IJ SOLN
INTRAMUSCULAR | Status: AC
  Filled 2018-11-26: qty 5

## 2018-11-26 MED ORDER — KETAMINE HCL 10 MG/ML IJ SOLN
INTRAMUSCULAR | Status: DC | PRN
  Administered 2018-11-26: 20 mg via INTRAVENOUS

## 2018-11-26 SURGICAL SUPPLY — 85 items
BANDAGE ACE 4X5 VEL STRL LF (GAUZE/BANDAGES/DRESSINGS) IMPLANT
BANDAGE ACE 6X5 VEL STRL LF (GAUZE/BANDAGES/DRESSINGS) ×3 IMPLANT
BANDAGE ESMARK 6X9 LF (GAUZE/BANDAGES/DRESSINGS) ×2 IMPLANT
BIT DRILL CALIBRATED 4.3X320MM (BIT) ×2 IMPLANT
BIT DRILL CROWE POINT TWST 4.3 (DRILL) ×2 IMPLANT
BLADE SURG 10 STRL SS (BLADE) ×3 IMPLANT
BNDG COHESIVE 4X5 TAN STRL (GAUZE/BANDAGES/DRESSINGS) ×3 IMPLANT
BNDG ESMARK 6X9 LF (GAUZE/BANDAGES/DRESSINGS) ×3
BNDG GAUZE ELAST 4 BULKY (GAUZE/BANDAGES/DRESSINGS) ×3 IMPLANT
BOOT STEPPER DURA XLG (SOFTGOODS) ×3 IMPLANT
BRUSH SCRUB SURG 4.25 DISP (MISCELLANEOUS) ×3 IMPLANT
CANISTER SUCTION WELLS/JOHNSON (MISCELLANEOUS) ×3 IMPLANT
CHLORAPREP W/TINT 26ML (MISCELLANEOUS) ×3 IMPLANT
COVER MAYO STAND STRL (DRAPES) ×3 IMPLANT
COVER SURGICAL LIGHT HANDLE (MISCELLANEOUS) ×3 IMPLANT
COVER WAND RF STERILE (DRAPES) ×3 IMPLANT
DRAPE C-ARM 42X72 X-RAY (DRAPES) ×3 IMPLANT
DRAPE C-ARMOR (DRAPES) ×3 IMPLANT
DRAPE HALF SHEET 40X57 (DRAPES) ×6 IMPLANT
DRAPE IMP U-DRAPE 54X76 (DRAPES) ×3 IMPLANT
DRAPE OEC MINIVIEW 54X84 (DRAPES) ×3 IMPLANT
DRAPE ORTHO SPLIT 77X108 STRL (DRAPES) ×2
DRAPE SURG ORHT 6 SPLT 77X108 (DRAPES) ×4 IMPLANT
DRAPE U-SHAPE 47X51 STRL (DRAPES) ×3 IMPLANT
DRILL CALIBRATED 4.3X320MM (BIT) ×3
DRILL CROWE POINT TWIST 4.3 (DRILL) ×3
DRSG ADAPTIC 3X8 NADH LF (GAUZE/BANDAGES/DRESSINGS) IMPLANT
DRSG AQUACEL AG ADV 3.5X 6 (GAUZE/BANDAGES/DRESSINGS) ×3 IMPLANT
DURAPREP 26ML APPLICATOR (WOUND CARE) ×3 IMPLANT
ELECT CAUTERY BLADE 6.4 (BLADE) ×3 IMPLANT
ELECT REM PT RETURN 9FT ADLT (ELECTROSURGICAL) ×3
ELECTRODE REM PT RTRN 9FT ADLT (ELECTROSURGICAL) ×2 IMPLANT
FIXATION ZIPTIGHT ANKLE SNDSMS (Ankle) ×2 IMPLANT
GAUZE SPONGE 4X4 12PLY STRL (GAUZE/BANDAGES/DRESSINGS) ×3 IMPLANT
GAUZE XEROFORM 1X8 LF (GAUZE/BANDAGES/DRESSINGS) ×3 IMPLANT
GLOVE BIOGEL PI IND STRL 8 (GLOVE) ×2 IMPLANT
GLOVE BIOGEL PI INDICATOR 8 (GLOVE) ×1
GLOVE ECLIPSE 8.0 STRL XLNG CF (GLOVE) ×3 IMPLANT
GOWN STRL REUS W/ TWL LRG LVL3 (GOWN DISPOSABLE) ×4 IMPLANT
GOWN STRL REUS W/ TWL XL LVL3 (GOWN DISPOSABLE) ×4 IMPLANT
GOWN STRL REUS W/TWL LRG LVL3 (GOWN DISPOSABLE) ×2
GOWN STRL REUS W/TWL XL LVL3 (GOWN DISPOSABLE) ×2
GUIDEWIRE 2.6X80 BEAD TIP (WIRE) ×2 IMPLANT
GUIDWIRE 2.6X80 BEAD TIP (WIRE) ×3
KIT BASIN OR (CUSTOM PROCEDURE TRAY) ×3 IMPLANT
KIT TURNOVER KIT B (KITS) ×3 IMPLANT
MANIFOLD NEPTUNE II (INSTRUMENTS) ×3 IMPLANT
NAIL TIBIAL PHOENIX 10.5X370MM (Nail) ×3 IMPLANT
NEEDLE 25GAX1.5 (MISCELLANEOUS) ×3 IMPLANT
NEEDLE HYPO 21X1.5 SAFETY (NEEDLE) IMPLANT
NS IRRIG 1000ML POUR BTL (IV SOLUTION) ×3 IMPLANT
PACK ORTHO EXTREMITY (CUSTOM PROCEDURE TRAY) ×3 IMPLANT
PACK TOTAL JOINT (CUSTOM PROCEDURE TRAY) ×3 IMPLANT
PACK UNIVERSAL I (CUSTOM PROCEDURE TRAY) ×3 IMPLANT
PAD ARMBOARD 7.5X6 YLW CONV (MISCELLANEOUS) ×6 IMPLANT
PAD CAST 4YDX4 CTTN HI CHSV (CAST SUPPLIES) IMPLANT
PADDING CAST COTTON 4X4 STRL (CAST SUPPLIES)
PADDING CAST COTTON 6X4 STRL (CAST SUPPLIES) IMPLANT
SCREW CORT TI DBL LEAD 5X38 (Screw) ×3 IMPLANT
SCREW CORT TI DBL LEAD 5X40 (Screw) ×3 IMPLANT
SCREW CORT TI DBL LEAD 5X42 (Screw) ×3 IMPLANT
SCREW CORT TI DBL LEAD 5X44 (Screw) ×3 IMPLANT
SPONGE LAP 18X18 X RAY DECT (DISPOSABLE) IMPLANT
STAPLER VISISTAT 35W (STAPLE) ×3 IMPLANT
SUCTION FRAZIER HANDLE 10FR (MISCELLANEOUS) ×1
SUCTION TUBE FRAZIER 10FR DISP (MISCELLANEOUS) ×2 IMPLANT
SUT ETHILON 3 0 PS 1 (SUTURE) ×6 IMPLANT
SUT MNCRL AB 4-0 PS2 18 (SUTURE) ×3 IMPLANT
SUT PROLENE 0 CT (SUTURE) IMPLANT
SUT VIC AB 0 CT1 27 (SUTURE) ×1
SUT VIC AB 0 CT1 27XBRD ANBCTR (SUTURE) ×2 IMPLANT
SUT VIC AB 1 CT1 27 (SUTURE) ×2
SUT VIC AB 1 CT1 27XBRD ANBCTR (SUTURE) ×4 IMPLANT
SUT VIC AB 2-0 CT1 27 (SUTURE) ×2
SUT VIC AB 2-0 CT1 TAPERPNT 27 (SUTURE) ×4 IMPLANT
SUT VIC AB 3-0 CT1 27 (SUTURE) ×1
SUT VIC AB 3-0 CT1 TAPERPNT 27 (SUTURE) ×2 IMPLANT
SYR CONTROL 10ML LL (SYRINGE) ×3 IMPLANT
TOWEL OR 17X24 6PK STRL BLUE (TOWEL DISPOSABLE) ×3 IMPLANT
TOWEL OR 17X26 10 PK STRL BLUE (TOWEL DISPOSABLE) ×6 IMPLANT
TUBE CONNECTING 12X1/4 (SUCTIONS) ×3 IMPLANT
UNDERPAD 30X30 (UNDERPADS AND DIAPERS) ×3 IMPLANT
WATER STERILE IRR 1000ML POUR (IV SOLUTION) ×3 IMPLANT
YANKAUER SUCT BULB TIP NO VENT (SUCTIONS) ×3 IMPLANT
ZIPTIGHT ANKLE SYNODESMOSS FIX (Ankle) ×3 IMPLANT

## 2018-11-26 NOTE — Anesthesia Preprocedure Evaluation (Addendum)
Anesthesia Evaluation  Patient identified by MRN, date of birth, ID band Patient awake    Reviewed: Allergy & Precautions, NPO status , Patient's Chart, lab work & pertinent test results  Airway Mallampati: IV  TM Distance: >3 FB Neck ROM: Full    Dental no notable dental hx.    Pulmonary sleep apnea ,    Pulmonary exam normal breath sounds clear to auscultation       Cardiovascular hypertension, Pt. on medications and Pt. on home beta blockers Normal cardiovascular exam Rhythm:Regular Rate:Normal  ECG: rate 65. Sinus rhythm Incomplete left bundle branch block  Myocardial perfusion The left ventricular ejection fraction is mildly decreased (45-54%). Nuclear stress EF: 54%. No T wave inversion was noted during stress. There was no ST segment deviation noted during stress. This is a low risk study.   ECHO: Normal LV systolic function; probable mild diastolic dysfunction; mild MR.    Neuro/Psych Seizures -,  PSYCHIATRIC DISORDERS Anxiety Depression Schizophrenia TIA   GI/Hepatic negative GI ROS, Neg liver ROS,   Endo/Other  negative endocrine ROS  Renal/GU negative Renal ROS     Musculoskeletal negative musculoskeletal ROS (+)   Abdominal   Peds  Hematology HLD   Anesthesia Other Findings Left tibia fxs  Reproductive/Obstetrics                            Anesthesia Physical Anesthesia Plan  ASA: III  Anesthesia Plan: General   Post-op Pain Management:    Induction: Intravenous  PONV Risk Score and Plan: 3 and Midazolam, Dexamethasone, Ondansetron and Treatment may vary due to age or medical condition  Airway Management Planned: LMA  Additional Equipment:   Intra-op Plan:   Post-operative Plan: Extubation in OR  Informed Consent: I have reviewed the patients History and Physical, chart, labs and discussed the procedure including the risks, benefits and alternatives for the  proposed anesthesia with the patient or authorized representative who has indicated his/her understanding and acceptance.   Dental advisory given  Plan Discussed with: CRNA  Anesthesia Plan Comments:         Anesthesia Quick Evaluation

## 2018-11-26 NOTE — Plan of Care (Signed)

## 2018-11-26 NOTE — Anesthesia Procedure Notes (Signed)
Procedure Name: LMA Insertion Date/Time: 11/26/2018 11:32 AM Performed by: Trinna Post., CRNA Pre-anesthesia Checklist: Patient identified, Emergency Drugs available, Suction available, Patient being monitored and Timeout performed Patient Re-evaluated:Patient Re-evaluated prior to induction Oxygen Delivery Method: Circle system utilized Preoxygenation: Pre-oxygenation with 100% oxygen Induction Type: IV induction Ventilation: Mask ventilation without difficulty LMA: LMA inserted LMA Size: 4.0 Number of attempts: 1 Placement Confirmation: positive ETCO2 and breath sounds checked- equal and bilateral Tube secured with: Tape Dental Injury: Teeth and Oropharynx as per pre-operative assessment

## 2018-11-26 NOTE — Transfer of Care (Signed)
Immediate Anesthesia Transfer of Care Note  Patient: [name]  Procedure(s) Performed: INTRAMEDULLARY (IM) NAIL TIBIAL (Left Leg Lower) OPEN REDUCTION INTERNAL FIXATION (ORIF) ANKLE FRACTURE (Left )  Patient Location: PACU  Anesthesia Type:General  Level of Consciousness: drowsy  Airway & Oxygen Therapy: Patient Spontanous Breathing and Patient connected to nasal cannula oxygen  Post-op Assessment: Report given to RN and Post -op Vital signs reviewed and stable  Post vital signs: Reviewed and stable  Last Vitals:  Vitals Value Taken Time  BP 156/83 11/26/2018  1:26 PM  Temp    Pulse 92 11/26/2018  1:28 PM  Resp 15 11/26/2018  1:28 PM  SpO2 95 % 11/26/2018  1:28 PM  Vitals shown include unvalidated device data.  Last Pain:  Vitals:   11/26/18 0931  TempSrc: Oral  PainSc:       Patients Stated Pain Goal: 2 (17/40/81 4481)  Complications: No apparent anesthesia complications

## 2018-11-26 NOTE — Progress Notes (Signed)
Orthopedic Tech Progress Note Patient Details:  [name] 04-Apr-1956 [id number]  Ortho Devices Type of Ortho Device: CAM walker Ortho Device/Splint Location: delivered to or Ortho Device/Splint Interventions: Ordered   Post Interventions Patient Tolerated: Well Instructions Provided: Care of device, Adjustment of device   Karolee Stamps 11/26/2018, 1:41 PM

## 2018-11-26 NOTE — Progress Notes (Addendum)
Medical Consultation Progress Note   [name]  HER:[id number]  DOB: [birth year]  DOA: 11/25/2018  PCP: Leeanne Rio, MD      Requesting physician: Dr. Griffin Basil Date of consultation: 11/25/18 Reason for consultation: Medical clearance for surgery and assistance with medical management.   History of Present Illness: [name] is an 62 y.o. male medical history significant for hypertension, hyperlipidemia, chronic diastolic CHF,Depression, anxiety, schizoaffective disorder, seizure disorder, constipation who presented to ED after mechanical fall is found to have closed left tibial fracture.  Patient underwent surgical repair on 11/26/2018.  Hospital service was consulted on 12/10 for medical clearance and to assist with chronic medical management.   Subjective: Patient states he is a bit confused since having his procedure today. States pain is adequately controlled currently. Denies any chest pain, shortness of breath, cough.   Physical Exam: Vitals:   11/26/18 1355 11/26/18 1410 11/26/18 1416 11/26/18 1430  BP: 115/63 130/75 120/68 132/82  Pulse: 91 88 89 91  Resp: 16 15 14 15   Temp:   98 F (36.7 C) (!) 97.5 F (36.4 C)  TempSrc:      SpO2: 97% 97% 95% 98%  Weight:      Height:         General: Alert, awake and oriented x3; afebrile, in no acute distress.  Able to follow commands appropriately and answering with good context all questions.  Eyes: extraocular muscles intact, no icterus,   ENT:   Moist oral mucosa  Cardiovascular: S1 and S2, no rubs, no gallops, no murmurs.  Respiratory: Good air movement bilaterally, normal respiratory effort on room air.  Abdomen: Soft, nontender, nondistended, positive bowel sounds.  Skin: No rashes, no petechiae; no open wounds.  Musculoskeletal: Left leg with dressing in place; no cyanosis, no clubbing.  No other joint abnormalities appreciated.  Psychiatric: Appropriate mood,   Neurologic:  Cranial nerves grossly intact, no focal deficits.  Able to move 4 limbs spontaneously.    Data reviewed:  I have personally reviewed following labs and imaging studies Labs:  CBC: Recent Labs  Lab 11/25/18 1339 11/26/18 0335  WBC 14.8* 9.2  NEUTROABS 11.8*  --   HGB 15.7 14.7  HCT 46.8 44.3  MCV 95.9 94.5  PLT 184 856    Basic Metabolic Panel: Recent Labs  Lab 11/25/18 1339 11/26/18 0253  NA 137 135  K 4.5 4.6  CL 105 106  CO2 23 19*  GLUCOSE 107* 88  BUN 28* 19  CREATININE 0.97 0.90  CALCIUM 8.3* 7.8*   GFR Estimated Creatinine Clearance: 97.5 mL/min (by C-G formula based on SCr of 0.9 mg/dL). Liver Function Tests: No results for input(s): AST, ALT, ALKPHOS, BILITOT, PROT, ALBUMIN in the last 168 hours. No results for input(s): LIPASE, AMYLASE in the last 168 hours. No results for input(s): AMMONIA in the last 168 hours. Coagulation profile No results for input(s): INR, PROTIME in the last 168 hours.  Cardiac Enzymes: No results for input(s): CKTOTAL, CKMB, CKMBINDEX, TROPONINI in the last 168 hours. BNP: Invalid input(s): POCBNP CBG: No results for input(s): GLUCAP in the last 168 hours. D-Dimer No results for input(s): DDIMER in the last 72 hours. Hgb A1c No results for input(s): HGBA1C in the last 72 hours. Lipid Profile No results for input(s): CHOL, HDL, LDLCALC, TRIG, CHOLHDL, LDLDIRECT in the last 72 hours. Thyroid function studies No results for input(s): TSH, T4TOTAL, T3FREE,  THYROIDAB in the last 72 hours.  Invalid input(s): FREET3 Anemia work up No results for input(s): VITAMINB12, FOLATE, FERRITIN, TIBC, IRON, RETICCTPCT in the last 72 hours. Urinalysis    Component Value Date/Time   COLORURINE YELLOW 08/04/2018 1211   APPEARANCEUR HAZY (A) 08/04/2018 1211   LABSPEC 1.012 08/04/2018 1211   PHURINE 5.0 08/04/2018 1211   GLUCOSEU NEGATIVE 08/04/2018 1211   HGBUR LARGE (A) 08/04/2018 1211   HGBUR negative 07/18/2010 1436   BILIRUBINUR  NEGATIVE 08/04/2018 1211   BILIRUBINUR NEG 01/17/2016 1446   KETONESUR NEGATIVE 08/04/2018 1211   PROTEINUR 30 (A) 08/04/2018 1211   UROBILINOGEN 0.2 01/17/2016 1446   UROBILINOGEN 0.2 07/29/2015 1708   NITRITE POSITIVE (A) 08/04/2018 1211   LEUKOCYTESUR LARGE (A) 08/04/2018 1211     Microbiology Recent Results (from the past 240 hour(s))  Surgical PCR screen     Status: None   Collection Time: 11/25/18  6:13 PM  Result Value Ref Range Status   MRSA, PCR NEGATIVE NEGATIVE Final   Staphylococcus aureus NEGATIVE NEGATIVE Final    Comment: (NOTE) The Xpert SA Assay (FDA approved for NASAL specimens in patients 30 years of age and older), is one component of a comprehensive surveillance program. It is not intended to diagnose infection nor to guide or monitor treatment. Performed at [facility], [place] [address] [place], [place] [address]        Inpatient Medications:   Scheduled Meds: . acetaminophen  1,000 mg Oral Q6H  . amitriptyline  200 mg Oral QHS  . amLODipine  5 mg Oral QHS  . [START ON 11/27/2018] aspirin EC  325 mg Oral Q breakfast  . atorvastatin  40 mg Oral Daily  . docusate sodium  100 mg Oral BID  . hydrochlorothiazide  25 mg Oral Daily  . HYDROmorphone      . HYDROmorphone      . lacosamide  150 mg Oral BID  . losartan  50 mg Oral Daily  . metoprolol succinate  200 mg Oral Daily  . multivitamin with minerals  1 tablet Oral Daily  . mupirocin ointment  1 application Nasal BID  . phenytoin  100 mg Oral QHS  . polyethylene glycol  17 g Oral Daily  . QUEtiapine  600 mg Oral QHS   Continuous Infusions: .  ceFAZolin (ANCEF) IV 2 g (11/26/18 1719)  . lactated ringers 50 mL/hr at 11/26/18 1511  . lactated ringers 10 mL/hr at 11/26/18 1013  . methocarbamol (ROBAXIN) IV       Radiological Exams on Admission: Dg Tibia/fibula Left  Result Date: 11/26/2018 CLINICAL DATA:  Left tibia ORIF. EXAM: DG C-ARM 61-120 MIN; LEFT TIBIA AND FIBULA - 2 VIEW  COMPARISON:  11/25/2018 FLUOROSCOPY TIME:  C-arm fluoroscopic images were obtained intraoperatively and submitted for post operative interpretation. Please see the performing provider's procedural report for the fluoroscopy time utilized. FINDINGS: Five intraoperative fluoroscopic images of the left lower leg are provided. An intramedullary nail has been placed across the distal metadiaphyseal tibial fracture. Proximal and distal interlocking screws are present. Fracture alignment is now near anatomic. A proximal fibular shaft fracture remains mildly displaced. IMPRESSION: Intraoperative images during ORIF of left tibia fracture. Electronically Signed   By: Logan Bores M.D.   On: 11/26/2018 12:59   Dg Tibia/fibula Left  Result Date: 11/25/2018 CLINICAL DATA:  Left tibia and fibular pain status post fall this morning. EXAM: LEFT TIBIA AND FIBULA - 2 VIEW COMPARISON:  None. FINDINGS: The patient  has sustained a spiral fracture of the distal tibial diaphysis exhibiting mild displacement. There is a spiral fracture of the proximal third of the fibula which is also displaced. The observed portions of the distal femur are normal. The malleoli are not well demonstrated on this study. IMPRESSION: Mildly displaced distal tibial shaft fracture. Moderately displaced proximal fibular shaft fracture. A left ankle series is recommended to assess the malleoli more completely. Electronically Signed   By: David  Martinique M.D.   On: 11/25/2018 09:37   Dg Ankle Complete Left  Result Date: 11/25/2018 CLINICAL DATA:  Left lower leg pain.  Known tib fib fracture EXAM: LEFT ANKLE COMPLETE - 3+ VIEW COMPARISON:  11/25/2018 FINDINGS: Comminuted, displaced distal metadiaphyseal fracture in the left tibia, stable since prior study. Previously seen proximal fibular fracture not visualized. Well corticated bone fragment at the tip of the medial malleolus felt represent old injury or secondary ossification center. IMPRESSION:  Comminuted, displaced distal tibial metadiaphyseal fracture. Electronically Signed   By: Rolm Baptise M.D.   On: 11/25/2018 10:27   Ct Ankle Left Wo Contrast  Result Date: 11/25/2018 CLINICAL DATA:  Trip and fall at 7 a.m. the patient suffered a trip and fall injury at 7 a.m. this morning resulting in a left lower leg fracture. Initial encounter. EXAM: CT OF THE LEFT ANKLE WITHOUT CONTRAST TECHNIQUE: Multidetector CT imaging of the left ankle was performed according to the standard protocol. Multiplanar CT image reconstructions were also generated. COMPARISON:  Plain films left ankle today. FINDINGS: Bones/Joint/Cartilage As seen on the comparison plain films, there is a spiral fracture of the distal left tibia. The fracture extends from 14.5 cm above the plafond and in a distal orientation with the main fracture line exiting the medial cortex of the diaphysis 6 cm above the tip of the medial malleolus, just superior to the metaphysis. There is slight lateral displacement of the main fracture fragment at the fracture line through the medial diaphysis and the superior fracture margins are distracted up to 0.5 cm. Below the main fracture line through the medial cortex of the diaphysis, a nondisplaced fracture line continues through the posterior cortex of the tibia to the metaphysis. The patient also has a fracture of the lateral corner of the anterior distal tibia where fragment measuring 1 cm AP by 0.6 cm transverse by 1.1 cm craniocaudal is identified. This fracture fragment is at the attachment site of the anterior tibiofibular ligament. Tiny well corticated ossicle off the medial malleolus is noted. No other fracture is identified. Imaged fibula is intact. Ligaments Suboptimally assessed by CT. Muscles and Tendons Intact.  No tendon entrapment. Soft tissues Mild subcutaneous edema noted. IMPRESSION: Mildly displaced spiral fracture of the distal tibia extends from the lateral cortex of the diaphysis through  the medial cortex of the diaphysis just proximal to the metaphysis. Nondisplaced component of the fracture extending more distally in the posterior cortex of the tibia noted. Small fracture off the anterior aspect of the lateral cortex of the distal tibia at the expected attachment site of the anterior tibiofibular ligament. The syndesmosis is not widened. Electronically Signed   By: Inge Rise M.D.   On: 11/25/2018 12:45   Dg Tibia/fibula Left Port  Result Date: 11/26/2018 CLINICAL DATA:  Status post intramedullary rod fixation of left tibial fracture. EXAM: PORTABLE LEFT TIBIA AND FIBULA - 2 VIEW COMPARISON:  Radiographs of November 25, 2018. FINDINGS: Status post intramedullary rod fixation of distal left tibial fracture. Good alignment of fracture components is  noted. Stable mildly displaced oblique fracture seen involving the proximal left fibula. IMPRESSION: Status post intramedullary rod fixation of distal left tibial fracture with improved alignment of fracture components. Electronically Signed   By: Marijo Conception, M.D.   On: 11/26/2018 14:06   Dg Ankle Left Port  Result Date: 11/26/2018 CLINICAL DATA:  Status post surgical fixation distal tibial fracture. EXAM: PORTABLE LEFT ANKLE - 2 VIEW COMPARISON:  Radiographs of November 25, 2018. FINDINGS: Status post intramedullary rod fixation of distal left tibial fracture with improved alignment of fracture components. No other significant bony abnormality is noted. IMPRESSION: Status post intramedullary rod fixation of distal left tibial fracture. Electronically Signed   By: Marijo Conception, M.D.   On: 11/26/2018 14:07   Dg C-arm 1-60 Min  Result Date: 11/26/2018 CLINICAL DATA:  Left tibia ORIF. EXAM: DG C-ARM 61-120 MIN; LEFT TIBIA AND FIBULA - 2 VIEW COMPARISON:  11/25/2018 FLUOROSCOPY TIME:  C-arm fluoroscopic images were obtained intraoperatively and submitted for post operative interpretation. Please see the performing provider's  procedural report for the fluoroscopy time utilized. FINDINGS: Five intraoperative fluoroscopic images of the left lower leg are provided. An intramedullary nail has been placed across the distal metadiaphyseal tibial fracture. Proximal and distal interlocking screws are present. Fracture alignment is now near anatomic. A proximal fibular shaft fracture remains mildly displaced. IMPRESSION: Intraoperative images during ORIF of left tibia fracture. Electronically Signed   By: Logan Bores M.D.   On: 11/26/2018 12:59    Impression/Recommendations Active Problems:   Hyperlipidemia   Seizure disorder (HCC)   Constipation, chronic   Schizoaffective disorder ([place])   Essential hypertension   Fall   MDD (major depressive disorder), recurrent severe, without psychosis ([place])   Closed tibia fracture   Fracture of tibial shaft, left, closed  #Left tibial fracture status post surgical repair.  As per primary.  #Hypertension, stable.  Currently at goal.  Continue home antihypertensive regimen.  #Hyperlipidemia, stable continue home lipitor  #Chronic diastolic heart failure, stable (last TTE 2018 showed grade 1 diastolic dysfunction).  Euvolemic on exam.  Continue to monitor daily weights and strict intake output.  #Depression/anxiety/schizoaffective disorder.  Stable mood.  Continue Elavil and Seroquel.  Discussed with patient and his wife he has been off Klonopin for the past month I have discontinued.  Delirium precautions in place  #Seizure disorder.  Phenytoin level subtherapeutic.  Advise neurology consultation to discuss need for any dose changes, continue Vimpat and Dilantin  #Constipation, continue to monitor.  Continue PRN Colace/MiraLAX.  Patient previously complaining of diarrhea will continue to hold laxatives  #Leukocytosis, resolved likely stress related in setting of acute fracture.  No need for antibiotics.   Thank you for this consultation.  Our [facility] hospitalist team will follow the  patient with you.     Desiree Hane M.D. [name] [email or web address] Password [facility]  11/26/2018, 7:38 PM

## 2018-11-26 NOTE — Anesthesia Postprocedure Evaluation (Signed)
Anesthesia Post Note  Patient: [name]  Procedure(s) Performed: INTRAMEDULLARY (IM) NAIL TIBIAL (Left Leg Lower) OPEN REDUCTION INTERNAL FIXATION (ORIF) ANKLE FRACTURE (Left )     Patient location during evaluation: PACU Anesthesia Type: General Level of consciousness: awake and alert Pain management: pain level controlled Vital Signs Assessment: post-procedure vital signs reviewed and stable Respiratory status: spontaneous breathing, nonlabored ventilation, respiratory function stable and patient connected to nasal cannula oxygen Cardiovascular status: blood pressure returned to baseline and stable Postop Assessment: no apparent nausea or vomiting Anesthetic complications: no    Last Vitals:  Vitals:   11/26/18 1416 11/26/18 1430  BP: 120/68 132/82  Pulse: 89 91  Resp: 14 15  Temp: 36.7 C (!) 36.4 C  SpO2: 95% 98%    Last Pain:  Vitals:   11/26/18 1430  TempSrc:   PainSc: 5                  Kayl Stogdill P Shadoe Bethel

## 2018-11-26 NOTE — Interval H&P Note (Signed)
Discussed case, risks and benefits with patient again.  All questions answered, no change to history.  Mohamud Mrozek MD  

## 2018-11-26 NOTE — Op Note (Signed)
Orthopaedic Surgery Operative Note (CSN: [id number])  [name]  12-Aug-1956 Date of Surgery: 11/26/2018   Diagnoses:  Left tibia and fibula fracture with syndesmosis injury  Procedure: Left tibial IM nail Left tibial syndesmosis fixation   Operative Finding Successful completion of planned procedure.  Good fixation of the comminuted very distal tibial fracture with 3 interlocks distally and one statically locked interlock proximally.  We were able to backslapping get good apposition of the fracture site with minimal translation.  His most this was unstable on live external rotation stress views with fluoroscopy but was improved and the mortise was appropriate after syndesmosis fixation with a Biomet suture based device.  Post-operative plan: The patient will be nonweightbearing in a boot versus a cast for the first 6 weeks.  The patient will be admitted overnight and discharged home.  DVT prophylaxis full-strength aspirin 325 mg once a day.  Pain control with PRN pain medication preferring oral medicines.  Follow up plan will be scheduled in approximately 7 days for incision check and XR.  Post-Op Diagnosis: Same Surgeons:Primary: Hiram Gash, MD Assistants: Joya Gaskins, OPAC Location: [facility] OR ROOM 06 Anesthesia: Choice Antibiotics: Ancef 2g preop, Vancomycin 1000mg  locally  Tourniquet time: None Estimated Blood Loss: 938 Complications: None Specimens: None Implants: Implant Name Type Inv. Item Serial No. Manufacturer Lot No. LRB No. Used Action  NAIL TIBIAL [place] 10.5X370MM - [id number] Nail NAIL TIBIAL [place] 10.5X370MM  [employer](ORTH,TRAU,BIO,SG) [id number] Left 1 Implanted  SCREW LEAD CORT TI-DOUBLE 5X44 - [id number] Screw SCREW LEAD CORT TI-DOUBLE 5X44  [employer](ORTH,TRAU,BIO,SG) [id number] Left 1 Implanted  SCREW CORTICAL 5X40MM - [id number] Screw SCREW CORTICAL 5X40MM  [employer](ORTH,TRAU,BIO,SG) [id number] Left 1 Implanted  SCREW CORT TI DBLE LEAD 5X38MM - [id number] Screw  SCREW CORT TI DBLE LEAD 5X38MM  [employer](ORTH,TRAU,BIO,SG) [id number] Left 1 Implanted  SCREW CORTICAL 5X42MM - [id number] Screw SCREW CORTICAL 5X42MM  [employer](ORTH,TRAU,BIO,SG) [id number] Left 1 Implanted  ZIPTIGHT ANKLE SYNODESMOSS FIX - [id number] Ankle ZIPTIGHT ANKLE SYNODESMOSS FIX  [employer](ORTH,TRAU,BIO,SG) [id number] Left 1 Implanted    Indications for Surgery:   [name] is a 62 y.o. male with fall resulting in the above injuries including a tibial fracture and a fibula fracture with syndesmosis injury noted on CT.  X-rays and CT demonstrated that the fracture was very distal but when amenable to intramedullary nail fixation.  Benefits and risks of operative and nonoperative management were discussed prior to surgery with patient/guardian(s) and informed consent form was completed.  Specific risks including infection, need for additional surgery, loss of fixation, stiffness, pain, knee pain, prominent hardware.   Procedure:   The patient was identified in the preoperative holding area where the surgical site was marked. The patient was taken to the OR where a procedural timeout was called and the above noted anesthesia was induced.  The patient was positioned prone on a radiolucent bed.  Preoperative antibiotics were dosed.  The patient's left tibia was prepped and draped in the usual sterile fashion.  A second preoperative timeout was called.      We began with a lateral parapatellar approach to the proximal tibia.  Incision was made midline overlying the distal one half of the patella as well as the proximal aspect of the patellar tendon.  We dissected down to layer 1 sharply and then raised thick skin flaps.  We are then able to move the tissue laterally and expose the lateral aspect of the patella and the patellar tendon.  Retinaculum was incised  sharply taking care to avoid involvement of the capsule thus we did not become intra-articular.  We then were able to sharply release the  lateral retinaculum for later closure to allow the patella to translate medially for the semi-extended nailing position.   Once this was performed we then began with our approach to the proximal must with a tibia and placed a guidewire on orthogonal views at the medial aspect of the lateral tibial eminence.  This was placed just off the anterior lip of the tibia as is typical for starting point for tibial nail.  This was then advanced on orthogonal views and an entry reamer was used to open the tibial canal.   Once this was performed were able to advance a ball-tipped guidewire down the length of the tibia and held the fracture reduced while the wire was passed across the fracture site itself.    We used a clamp to hold our reduction as well as traction.  Fracture exited very distal so we had to use 3 distal interlocks and take care that the nail was down distal enough to obtain purchase.  This was passed to the level of the distal physeal scar.  This point we obtained a measurement using fluoroscopy to guide her management.  We measured for a 370 mm nail.  We then began with reaming.  Sequentially reamed up from an 8 mm size to a 44mm size containing chatter with our largest reamers.  We selected a 10.37mm nail based on this.  Fracture was held reduced throughout the reaming process.   This point a Biomet [place] 10.5 mm x 37 cm and placed under orthogonal fluoroscopic images to the appropriate position.  Were happy with our length rotation and alignment and placed 3 distal interlocks and were able to back slap while still keeping the nail seated.  We then placed 1 interlock proximally and locked it with the [facility] mechanism interlocks with the distal interlocks using their outrigger.   Final assessment of rotation and alignment was appropriate and we are satisfied with final fluoroscopic images.  On external rotation stress views the ankle was somewhat unstable we felt that it would benefit from  syndesmosis fixation.  We then used the Biomet suture loop button to perform a syndesmosis fixation using a percutaneous lateral incision and utilizing a medial incision that was used for our screw fixation of the distal nail.  Were able to drill across 4 cortices past the button and note that was flipped not interposing any soft tissue.  We then were able to cinch down the syndesmosis and final fluoroscopic images demonstrate a stable syndesmosis.   The incision was thoroughly irrigated and closed in a multilayer fashion with absorbable sutures. A sterile dressing was placed.  A boot was placed.  The patient was awoken from general anesthesia and taken to the PACU in stable condition without complication.   Joya Gaskins, OPA-C, present and scrubbed throughout the case, critical for completion in a timely fashion, and for retraction, instrumentation, closure.

## 2018-11-26 NOTE — Progress Notes (Signed)
RN verified the presence of a signed informed consent that matches stated procedure by patient. Verified armband matches patient's stated name and birth date. Verified NPO status and that all jewelry, contact, glasses, dentures, and partials had been removed (if applicable).  

## 2018-11-26 NOTE — Progress Notes (Addendum)
Pt is A&O x4, NPO post midnight maint. Pre-procedure checklist completed. Report was given to Manuela Schwartz, to short stay via bed.  1430 Received pt from PACU, A&O x4, anxious. LLE with ace wrap dry and intact, CAM walker on. Left toes warm and mobile. 2 ice pack replaced to left lower leg.

## 2018-11-27 ENCOUNTER — Encounter (HOSPITAL_COMMUNITY): Payer: Self-pay | Admitting: Orthopaedic Surgery

## 2018-11-27 LAB — BASIC METABOLIC PANEL
Anion gap: 11 (ref 5–15)
BUN: 14 mg/dL (ref 8–23)
CO2: 24 mmol/L (ref 22–32)
Calcium: 8.7 mg/dL — ABNORMAL LOW (ref 8.9–10.3)
Chloride: 101 mmol/L (ref 98–111)
Creatinine, Ser: 0.96 mg/dL (ref 0.61–1.24)
GFR calc Af Amer: >60 mL/min (ref >60)
GFR calc non Af Amer: >60 mL/min (ref >60)
Glucose, Bld: 147 mg/dL — ABNORMAL HIGH (ref 70–99)
Potassium: 4.1 mmol/L (ref 3.5–5.1)
Sodium: 136 mmol/L (ref 135–145)

## 2018-11-27 LAB — CBC
HCT: 40.8 % (ref 39.0–52.0)
Hemoglobin: 13.8 g/dL (ref 13.0–17.0)
MCH: 31.4 pg (ref 26.0–34.0)
MCHC: 33.8 g/dL (ref 30.0–36.0)
MCV: 92.9 fL (ref 80.0–100.0)
Platelets: 195 10*3/uL (ref 150–400)
RBC: 4.39 MIL/uL (ref 4.22–5.81)
RDW: 13 % (ref 11.5–15.5)
WBC: 14.5 10*3/uL — ABNORMAL HIGH (ref 4.0–10.5)
nRBC: 0 % (ref 0.0–0.2)

## 2018-11-27 MED ORDER — ONDANSETRON HCL 4 MG PO TABS: 4.0000 mg | ORAL_TABLET | Freq: Three times a day (TID) | ORAL | 1 refills | Status: AC | PRN

## 2018-11-27 MED ORDER — ACETAMINOPHEN 500 MG PO TABS: 1000.0000 mg | ORAL_TABLET | Freq: Three times a day (TID) | ORAL | 0 refills | Status: AC

## 2018-11-27 MED ORDER — ASPIRIN EC 325 MG PO TBEC: 325.0000 mg | DELAYED_RELEASE_TABLET | Freq: Every day | ORAL | 0 refills | Status: AC

## 2018-11-27 MED ORDER — OXYCODONE HCL 5 MG PO TABS: ORAL_TABLET | ORAL | 0 refills | Status: AC

## 2018-11-27 NOTE — Progress Notes (Signed)
Medical Consultation Progress Note   [name]  WUJ:[id number]  DOB: 15-Jul-1956  DOA: 11/25/2018  PCP: Leeanne Rio, MD    Requesting physician: Dr. Griffin Basil Date of consultation: 11/25/18 Reason for consultation: Medical clearance for surgery and assistance with medical management.   History of Present Illness: [name] is an 62 y.o. male medical history significant for hypertension, hyperlipidemia, chronic diastolic CHF,Depression, anxiety, schizoaffective disorder, seizure disorder, constipation who presented to ED after mechanical fall is found to have closed left tibial fracture.  Patient underwent surgical repair on 11/26/2018.  Hospital service was consulted on 12/10 for medical clearance and to assist with chronic medical management.   Subjective: Feels better today Is sleepy  No cough, CP, shortness of breath   Physical Exam: Vitals:   11/27/18 0106 11/27/18 0540 11/27/18 1008 11/27/18 1449  BP: 124/78 99/68 101/75 110/68  Pulse: 88 89  85  Resp: 16 18  20   Temp:  98.4 F (36.9 C) 98.4 F (36.9 C) 97.6 F (36.4 C)  TempSrc:  Oral Oral Oral  SpO2: 91% 95% 96% 95%  Weight:  99.1 kg    Height:         General: Alert, awake and oriented x3; afebrile, in no acute distress.  Able to follow commands appropriately and answering with good context all questions.  Eyes: extraocular muscles intact, no icterus,   ENT:   Moist oral mucosa  Cardiovascular: S1 and S2, no rubs, no gallops, no murmurs.  Respiratory: Good air movement bilaterally, normal respiratory effort on room air.  Abdomen: Soft, nontender, nondistended, positive bowel sounds.  Skin: No rashes, no petechiae; no open wounds.  Musculoskeletal: Left leg with dressing and brace stabilizer in place; no cyanosis, no clubbing.  No other joint abnormalities appreciated.  Psychiatric: Appropriate mood,   Neurologic: Cranial nerves grossly intact, no focal deficits.  Able to move  4 limbs spontaneously.    Data reviewed:  I have personally reviewed following labs and imaging studies Labs:  CBC: Recent Labs  Lab 11/25/18 1339 11/26/18 0335 11/27/18 0148  WBC 14.8* 9.2 14.5*  NEUTROABS 11.8*  --   --   HGB 15.7 14.7 13.8  HCT 46.8 44.3 40.8  MCV 95.9 94.5 92.9  PLT 184 171 956    Basic Metabolic Panel: Recent Labs  Lab 11/25/18 1339 11/26/18 0253 11/27/18 0148  NA 137 135 136  K 4.5 4.6 4.1  CL 105 106 101  CO2 23 19* 24  GLUCOSE 107* 88 147*  BUN 28* 19 14  CREATININE 0.97 0.90 0.96  CALCIUM 8.3* 7.8* 8.7*   GFR Estimated Creatinine Clearance: 93.2 mL/min (by C-G formula based on SCr of 0.96 mg/dL). Liver Function Tests: No results for input(s): AST, ALT, ALKPHOS, BILITOT, PROT, ALBUMIN in the last 168 hours. No results for input(s): LIPASE, AMYLASE in the last 168 hours. No results for input(s): AMMONIA in the last 168 hours. Coagulation profile No results for input(s): INR, PROTIME in the last 168 hours.  Cardiac Enzymes: No results for input(s): CKTOTAL, CKMB, CKMBINDEX, TROPONINI in the last 168 hours. BNP: Invalid input(s): POCBNP CBG: No results for input(s): GLUCAP in the last 168 hours. D-Dimer No results for input(s): DDIMER in the last 72 hours. Hgb A1c No results for input(s): HGBA1C in the last 72 hours. Lipid Profile No results for input(s): CHOL, HDL, LDLCALC, TRIG, CHOLHDL, LDLDIRECT in the last 72 hours.  Thyroid function studies No results for input(s): TSH, T4TOTAL, T3FREE, THYROIDAB in the last 72 hours.  Invalid input(s): FREET3 Anemia work up No results for input(s): VITAMINB12, FOLATE, FERRITIN, TIBC, IRON, RETICCTPCT in the last 72 hours. Urinalysis    Component Value Date/Time   COLORURINE YELLOW 08/04/2018 1211   APPEARANCEUR HAZY (A) 08/04/2018 1211   LABSPEC 1.012 08/04/2018 1211   PHURINE 5.0 08/04/2018 1211   GLUCOSEU NEGATIVE 08/04/2018 1211   HGBUR LARGE (A) 08/04/2018 1211   HGBUR negative  07/18/2010 1436   BILIRUBINUR NEGATIVE 08/04/2018 1211   BILIRUBINUR NEG 01/17/2016 1446   KETONESUR NEGATIVE 08/04/2018 1211   PROTEINUR 30 (A) 08/04/2018 1211   UROBILINOGEN 0.2 01/17/2016 1446   UROBILINOGEN 0.2 07/29/2015 1708   NITRITE POSITIVE (A) 08/04/2018 1211   LEUKOCYTESUR LARGE (A) 08/04/2018 1211     Microbiology Recent Results (from the past 240 hour(s))  Surgical PCR screen     Status: None   Collection Time: 11/25/18  6:13 PM  Result Value Ref Range Status   MRSA, PCR NEGATIVE NEGATIVE Final   Staphylococcus aureus NEGATIVE NEGATIVE Final    Comment: (NOTE) The Xpert SA Assay (FDA approved for NASAL specimens in patients 34 years of age and older), is one component of a comprehensive surveillance program. It is not intended to diagnose infection nor to guide or monitor treatment. Performed at [facility], [place] [address], [place], [place] [address]        Inpatient Medications:   Scheduled Meds: . acetaminophen  1,000 mg Oral Q6H  . amitriptyline  200 mg Oral QHS  . amLODipine  5 mg Oral QHS  . aspirin EC  325 mg Oral Q breakfast  . atorvastatin  40 mg Oral Daily  . docusate sodium  100 mg Oral BID  . hydrochlorothiazide  25 mg Oral Daily  . lacosamide  150 mg Oral BID  . losartan  50 mg Oral Daily  . metoprolol succinate  200 mg Oral Daily  . multivitamin with minerals  1 tablet Oral Daily  . mupirocin ointment  1 application Nasal BID  . phenytoin  100 mg Oral QHS  . polyethylene glycol  17 g Oral Daily  . QUEtiapine  600 mg Oral QHS   Continuous Infusions: . lactated ringers 50 mL/hr at 11/27/18 0300  . lactated ringers 10 mL/hr at 11/26/18 1013  . methocarbamol (ROBAXIN) IV       Radiological Exams on Admission: Dg Tibia/fibula Left  Result Date: 11/26/2018 CLINICAL DATA:  Left tibia ORIF. EXAM: DG C-ARM 61-120 MIN; LEFT TIBIA AND FIBULA - 2 VIEW COMPARISON:  11/25/2018 FLUOROSCOPY TIME:  C-arm fluoroscopic images were obtained  intraoperatively and submitted for post operative interpretation. Please see the performing provider's procedural report for the fluoroscopy time utilized. FINDINGS: Five intraoperative fluoroscopic images of the left lower leg are provided. An intramedullary nail has been placed across the distal metadiaphyseal tibial fracture. Proximal and distal interlocking screws are present. Fracture alignment is now near anatomic. A proximal fibular shaft fracture remains mildly displaced. IMPRESSION: Intraoperative images during ORIF of left tibia fracture. Electronically Signed   By: Logan Bores M.D.   On: 11/26/2018 12:59   Dg Tibia/fibula Left Port  Result Date: 11/26/2018 CLINICAL DATA:  Status post intramedullary rod fixation of left tibial fracture. EXAM: PORTABLE LEFT TIBIA AND FIBULA - 2 VIEW COMPARISON:  Radiographs of November 25, 2018. FINDINGS: Status post intramedullary rod fixation of distal left tibial fracture. Good alignment of fracture  components is noted. Stable mildly displaced oblique fracture seen involving the proximal left fibula. IMPRESSION: Status post intramedullary rod fixation of distal left tibial fracture with improved alignment of fracture components. Electronically Signed   By: Marijo Conception, M.D.   On: 11/26/2018 14:06   Dg Ankle Left Port  Result Date: 11/26/2018 CLINICAL DATA:  Status post surgical fixation distal tibial fracture. EXAM: PORTABLE LEFT ANKLE - 2 VIEW COMPARISON:  Radiographs of November 25, 2018. FINDINGS: Status post intramedullary rod fixation of distal left tibial fracture with improved alignment of fracture components. No other significant bony abnormality is noted. IMPRESSION: Status post intramedullary rod fixation of distal left tibial fracture. Electronically Signed   By: Marijo Conception, M.D.   On: 11/26/2018 14:07   Dg C-arm 1-60 Min  Result Date: 11/26/2018 CLINICAL DATA:  Left tibia ORIF. EXAM: DG C-ARM 61-120 MIN; LEFT TIBIA AND FIBULA - 2 VIEW  COMPARISON:  11/25/2018 FLUOROSCOPY TIME:  C-arm fluoroscopic images were obtained intraoperatively and submitted for post operative interpretation. Please see the performing provider's procedural report for the fluoroscopy time utilized. FINDINGS: Five intraoperative fluoroscopic images of the left lower leg are provided. An intramedullary nail has been placed across the distal metadiaphyseal tibial fracture. Proximal and distal interlocking screws are present. Fracture alignment is now near anatomic. A proximal fibular shaft fracture remains mildly displaced. IMPRESSION: Intraoperative images during ORIF of left tibia fracture. Electronically Signed   By: Logan Bores M.D.   On: 11/26/2018 12:59    Impression/Recommendations Active Problems:   Hyperlipidemia   Seizure disorder (HCC)   Constipation, chronic   Schizoaffective disorder ([place])   Essential hypertension   Fall   MDD (major depressive disorder), recurrent severe, without psychosis ([place])   Closed tibia fracture   Fracture of tibial shaft, left, closed  #Left tibial fracture status post surgical repair.  As per primary. PT recommends SNF  #Hypertension, stable.  Currently at goal.  Continue home antihypertensive regimen.  #Hyperlipidemia, stable continue home lipitor  #Chronic diastolic heart failure, stable (last TTE 2018 showed grade 1 diastolic dysfunction).  Euvolemic on exam.  Continue to monitor daily weights and strict intake output.  #Depression/anxiety/schizoaffective disorder.  Stable mood.  Continue Elavil and Seroquel.  Discussed with patient and his wife he has been off Klonopin for the past month so discontinued on 11/26/18.  Delirium precautions in place  #Seizure disorder.  Phenytoin level subtherapeutic.  Advise neurology consultation to discuss need for any dose changes, continue Vimpat and Dilantin  #Constipation, continue to monitor.  Continue PRN Colace/MiraLAX.  Patient previously complaining of diarrhea will  continue to hold laxatives  #Leukocytosis, resolved likely stress related in setting of acute fracture.  No need for antibiotics.   Thank you for this consultation.  Our [facility] hospitalist team will sign off.     Desiree Hane M.D. [name] [email or web address] Password [facility]  11/27/2018, 4:22 PM

## 2018-11-27 NOTE — Discharge Summary (Addendum)
Patient ID: [name] MRN: [id number] DOB/AGE: Mar 01, 1956 62 y.o.  Admit date: 11/25/2018 Discharge date: 11/28/2018  Admission Diagnoses:L tibial and fibular fracture with sydesmosis injury  Discharge Diagnoses:  Active Problems:   Hyperlipidemia   Seizure disorder (HCC)   Constipation, chronic   Schizoaffective disorder (HCC)   Essential hypertension   Fall   MDD (major depressive disorder), recurrent severe, without psychosis ([place])   Closed tibia fracture   Fracture of tibial shaft, left, closed   Past Medical History:  Diagnosis Date  . Anxiety   . Constipation   . Depression   . Dyslipidemia   . GERD (gastroesophageal reflux disease)   . Hearing loss   . History of echocardiogram    Echo 8/18: EF 50-55, normal wall motion, grade 1 diastolic dysfunction, mild MR  . Hypertension   . Memory difficulty 10/25/2017  . Memory loss   . MRSA carrier   . Schizoaffective disorder   . Seizures ([place])      Procedures Performed: Left tibial IM nail and syndesmosis fixation  Discharged Condition: good  Hospital Course: Patient brought in from ER for surgery.  Tolerated procedure well.  Was kept for monitoring overnight for pain control and medical monitoring postop.  He was unable to mobilize and tolerated hospitalization well but required 2 nights to mobilize.  Patient was instructed on specific activity restrictions and all questions were answered.   Consults: None  Significant Diagnostic Studies: No additional pertinent studies  Treatments: Surgery  Discharge Exam:  Dressing CDI, +EHL though remainder of motor difficult to test due to splint, sensation intact distally with warm well perfused foot, no pain w passive stretch   Disposition:  Home with home health  Discharge Instructions    Call MD for:  persistant nausea and vomiting   Complete by:  As directed    Call MD for:  redness, tenderness, or signs of infection (pain, swelling, redness, odor or  green/yellow discharge around incision site)   Complete by:  As directed    Call MD for:  severe uncontrolled pain   Complete by:  As directed    Diet - low sodium heart healthy   Complete by:  As directed    Discharge instructions   Complete by:  As directed    Ophelia Charter MD, MPH [facility] [address], [address] [phone] (tel)   [phone] (fax)   POST-OPERATIVE INSTRUCTIONS   WOUND CARE Please keep splint clean dry and intact until followup.  You may shower on Post-Op Day #2. You must keep splint dry during this process and may find that a plastic bag taped around the leg or alternatively a towel based bath may be a better option.  If you get your splint wet or if it is damaged please contact our clinic.  EXERCISES Due to your splint being in place you will not be able to bear weight through your extremity.  Please use crutches or a walker to avoid weight bearing.   POST-OP MEDICINES A multi-modal approach will be used to treat your pain. Oxycodone - This is a strong narcotic, to be used only on an "as needed" basis for pain. Acetaminophen 500mg - A non-narcotic pain medicine.  Use 1000mg  three times a day for the first 14 days after surgery   Zofran 4mg  - This is an anti-nausea medicine to be used only if you are having nausea or vomitting. Aspirin 325mg  - This is a medicine used to help prevent  blood clots.  Please use it daily.   If you have any adverse effects with the medications, please call our office.  FOLLOW-UP If you develop a Fever (>101.5), Redness or Drainage from the surgical incision site, please call our office to arrange for an evaluation. Please call the office to schedule a follow-up appointment for your suture removal, 10-14 days post-operatively.  IF YOU HAVE ANY QUESTIONS, PLEASE FEEL FREE TO CALL OUR OFFICE.  HELPFUL INFORMATION You should wean off your narcotic medicines as soon as you are able.  Most patients will be  off or using minimal narcotics before their first postop appointment.    We suggest you use the pain medication the first night prior to going to bed, in order to ease any pain when the anesthesia wears off. You should avoid taking pain medications on an empty stomach as it will make you nauseous.  Do not drink alcoholic beverages or take illicit drugs when taking pain medications.  In most states it is against the law to drive while you are in a splint or sling.  And certainly against the law to drive while taking narcotics.  You may return to work/school in the next couple of days when you feel up to it.    Pain medication may make you constipated.  Below are a few solutions to try in this order: Decrease the amount of pain medication if you aren't having pain. Drink lots of decaffeinated fluids. Drink prune juice and/or each dried prunes  If the first 3 don't work start with additional solutions Take Colace - an over-the-counter stool softener Take Senokot - an over-the-counter laxative Take Miralax - a stronger over-the-counter laxative   Increase activity slowly   Complete by:  As directed      Allergies as of 11/28/2018      Reactions   Codeine Nausea And Vomiting   Sulfa Antibiotics Nausea And Vomiting   Sulfasalazine Nausea And Vomiting      Medication List    TAKE these medications   acetaminophen 500 MG tablet Commonly known as:  TYLENOL Take 2 tablets (1,000 mg total) by mouth every 8 (eight) hours for 14 days.   amitriptyline 100 MG tablet Commonly known as:  ELAVIL Take 2 tablets (200 mg total) by mouth at bedtime.   amLODipine 5 MG tablet Commonly known as:  NORVASC Take 1 tablet (5 mg total) by mouth daily. What changed:  when to take this   aspirin EC 325 MG tablet Take 1 tablet (325 mg total) by mouth daily. What changed:    medication strength  how much to take   atorvastatin 40 MG tablet Commonly known as:  LIPITOR Take 1 tablet (40 mg  total) by mouth daily. What changed:  when to take this   clonazePAM 1 MG tablet Commonly known as:  KLONOPIN Take 1 tablet (1 mg total) by mouth at bedtime.   hydrochlorothiazide 25 MG tablet Commonly known as:  HYDRODIURIL Take 1 tablet (25 mg total) by mouth daily. What changed:  when to take this   Lacosamide 150 MG Tabs Commonly known as:  VIMPAT Take 1 tablet (150 mg total) by mouth 2 (two) times daily.   losartan 50 MG tablet Commonly known as:  COZAAR TAKE 1 TABLET BY MOUTH EVERY DAY What changed:  when to take this   metoprolol succinate 100 MG 24 hr tablet Commonly known as:  TOPROL-XL TAKE 2 TABLETS (200 MG TOTAL) DAILY BY MOUTH. What changed:  when to take this   multivitamin with minerals Tabs tablet Take 1 tablet by mouth daily.   ondansetron 4 MG tablet Commonly known as:  ZOFRAN Take 1 tablet (4 mg total) by mouth every 8 (eight) hours as needed for up to 7 days for nausea or vomiting.   oxyCODONE 5 MG immediate release tablet Commonly known as:  Oxy IR/ROXICODONE Take 1-2 pills every 6 hrs as needed for pain, no more than 6 per day   phenytoin 100 MG ER capsule Commonly known as:  DILANTIN Take 1 capsule (100 mg total) by mouth at bedtime.   polyethylene glycol powder powder Commonly known as:  [employer] PURELAX TAKE 17 GRAMS 2 TIMES A DAY AS NEEDED FOR CONSTIPATION. MIX INTO 8 OF FLUID AND DRINK What changed:    how much to take  how to take this  when to take this  additional instructions   QUEtiapine 300 MG tablet Commonly known as:  SEROQUEL Take 2 tablets (600 mg total) by mouth at bedtime.   testosterone cypionate 200 MG/ML injection Commonly known as:  DEPOTESTOSTERONE CYPIONATE Inject 0.75 mLs (150 mg total) into the muscle every 14 (fourteen) days.

## 2018-11-27 NOTE — Progress Notes (Signed)
Physical Therapy Treatment Patient Details Name: [name] MRN: [id number] DOB: 02-Jan-1956 Today's Date: 11/27/2018    History of Present Illness Patient is a 62 y/o male s/p left tibial IM nail and left tibial syndesmosis fixation. PMH significant for anxiety, depression, HTN, seizures, schizoaffective disorder.     PT Comments    Patient seen for mobility progression. Pt is making gradual progress toward PT goals. Pt demonstrates decreased safety awareness this session and attempting to stand without RW or assistance with weight bearing on L LE just after review of precautions. Pt requires min/mod A for functional transfers and gait training. Pt has stairs to enter home but unable to progress to stair training today. Continue to progress as tolerated.    Follow Up Recommendations  SNF;Supervision/Assistance - 24 hour     Equipment Recommendations  Rolling walker with 5" wheels    Recommendations for Other Services OT consult     Precautions / Restrictions Precautions Precautions: Fall Restrictions Weight Bearing Restrictions: Yes LLE Weight Bearing: Non weight bearing    Mobility  Bed Mobility Overal bed mobility: Needs Assistance Bed Mobility: Supine to Sit     Supine to sit: Min guard     General bed mobility comments: for safety  Transfers Overall transfer level: Needs assistance Equipment used: Rolling walker (2 wheeled) Transfers: Sit to/from Stand Sit to Stand: Min assist;Mod assist         General transfer comment: cues for L LE positioning to maintain NWB status and for safe hand placement; assist to power up into standing X 2 trials and to maintain weight bearing status  Ambulation/Gait Ambulation/Gait assistance: Min assist;Mod assist Gait Distance (Feet): (12 ft then 25 ft) Assistive device: Rolling walker (2 wheeled) Gait Pattern/deviations: Step-to pattern Gait velocity: decreased   General Gait Details: assist for balance and for safe use  of AD; cues for safe proximity to RW and for sequencing; pt able to maintain weight bearing status >75 % of the time; chair follow and need of seated rest breaks   Stairs             Wheelchair Mobility    Modified Rankin (Stroke Patients Only)       Balance Overall balance assessment: Needs assistance Sitting-balance support: No upper extremity supported;Feet supported Sitting balance-Leahy Scale: Fair     Standing balance support: Bilateral upper extremity supported;During functional activity Standing balance-Leahy Scale: Poor                              Cognition Arousal/Alertness: Awake/alert;Lethargic Behavior During Therapy: Impulsive;Restless;Anxious Overall Cognitive Status: No family/caregiver present to determine baseline cognitive functioning                                        Exercises      General Comments        Pertinent Vitals/Pain Pain Assessment: No/denies pain    Home Living                      Prior Function            PT Goals (current goals can now be found in the care plan section) Acute Rehab PT Goals Patient Stated Goal: none stated Progress towards PT goals: Progressing toward goals    Frequency    Min 5X/week  PT Plan Current plan remains appropriate    Co-evaluation              AM-PAC PT "6 Clicks" Mobility   Outcome Measure  Help needed turning from your back to your side while in a flat bed without using bedrails?: A Little Help needed moving from lying on your back to sitting on the side of a flat bed without using bedrails?: A Lot Help needed moving to and from a bed to a chair (including a wheelchair)?: A Little Help needed standing up from a chair using your arms (e.g., wheelchair or bedside chair)?: A Lot Help needed to walk in hospital room?: A Lot Help needed climbing 3-5 steps with a railing? : Total 6 Click Score: 13    End of Session Equipment  Utilized During Treatment: Gait belt Activity Tolerance: Patient tolerated treatment well Patient left: with call bell/phone within reach;in chair Nurse Communication: Mobility status PT Visit Diagnosis: Unsteadiness on feet (R26.81);Other abnormalities of gait and mobility (R26.89);Muscle weakness (generalized) (M62.81);History of falling (Z91.81)     Time: 4388-8757 PT Time Calculation (min) (ACUTE ONLY): 24 min  Charges:  $Gait Training: 8-22 mins $Therapeutic Activity: 8-22 mins                     Earney Navy, PTA Acute Rehabilitation Services Pager: [phone] Office: [phone]     Darliss Cheney 11/27/2018, 4:38 PM

## 2018-11-27 NOTE — Progress Notes (Signed)
Pt was not able to walk with PT this morning. Not safe to go home today per PT. Left a message for Dr Griffin Basil at the office.

## 2018-11-27 NOTE — Progress Notes (Signed)
CSW acknowledges social work consult. Patient will be going home with home health per doctor's note. No further social work needs. RNCM notified.   CSW signing off.   [facility], [place]

## 2018-11-27 NOTE — NC FL2 (Signed)
[place] LEVEL OF CARE SCREENING TOOL     IDENTIFICATION  Patient Name: [name] Birthdate: 04-09-1956 Sex: male Admission Date (Current Location): 11/25/2018  [facility] and [place] Number:  Herbalist and Address:  The [place], [place] [address], [place],  [address]      Provider Number: [id number]  Attending Physician Name and Address:  Hiram Gash, MD  Relative Name and Phone Number:  [name] [phone]    Current Level of Care: Hospital Recommended Level of Care: [place] Prior Approval Number:    Date Approved/Denied:   PASRR Number: (pending)  Discharge Plan: SNF    Current Diagnoses: Patient Active Problem List   Diagnosis Date Noted  . Closed tibia fracture 11/25/2018  . Fracture of tibial shaft, left, closed 11/25/2018  . Unintentional weight loss 04/07/2018  . Syncope and collapse 12/13/2017  . Memory difficulty 10/25/2017  . Presbyacusia 07/26/2017  . Palpitations 07/01/2017  . Acute bronchiolitis due to human metapneumovirus   . Mild sleep apnea 06/15/2016  . Actinic keratosis 06/15/2016  . Nocturnal dyspnea 02/03/2016  . Skin lesion of face 02/03/2016  . Paresthesia of both hands 09/16/2015  . Phenytoin toxicity   . TIA (transient ischemic attack) 07/29/2015  . Vertigo 07/29/2015  . Schizoaffective disorder, depressive type with good prognostic features ([place]) 07/13/2015  . Verruca 05/18/2015  . Seborrheic dermatitis of scalp 05/18/2015  . MDD (major depressive disorder), recurrent severe, without psychosis ([place])   . OCD (obsessive compulsive disorder)   . Seizures ([place])   . Schizoaffective disorder, depressive type ([place])   . Orthostatic hypotension   . Fall   . Seizure ([place]) 11/18/2014  . Schizoaffective disorder, unspecified type ([place])   . Essential hypertension   . Overdose of beta-adrenergic antagonist drug 10/22/2014  . Overdose   . Nocturnal enuresis 03/23/2014   . Benzodiazepine dependence, continuous ([place]) 01/27/2014  . Severe major depression ([place]) 01/21/2014  . Frozen shoulder syndrome 09/20/2013  . Somnolence 09/20/2013  . Schizoaffective disorder ([place]) 09/20/2013  . Presbyopia 04/25/2012  . Androgen deficiency 09/07/2011  . Constipation, chronic 09/07/2011  . Other specified disorders of liver 12/30/2008  . Overweight(278.02) 09/08/2008  . Hyperlipidemia 01/28/2008  . Seizure disorder ([place]) 01/28/2008  . OSTEOPENIA 07/01/2007    Orientation RESPIRATION BLADDER Height & Weight     Self, Time, Situation, Place  Normal Continent Weight: 99.1 kg Height:  5\' 11"  (180.3 cm)  BEHAVIORAL SYMPTOMS/MOOD NEUROLOGICAL BOWEL NUTRITION STATUS      Continent Diet(see discharge)  AMBULATORY STATUS COMMUNICATION OF NEEDS Skin   Extensive Assist Verbally Skin abrasions(abrasions on arm and legs, left leg closed surgical incision)                       Personal Care Assistance Level of Assistance  Bathing, Feeding, Dressing, Total care Bathing Assistance: Maximum assistance Feeding assistance: Independent Dressing Assistance: Maximum assistance Total Care Assistance: Maximum assistance   Functional Limitations Info  Sight, Hearing, Speech Sight Info: Adequate Hearing Info: Adequate Speech Info: Adequate    SPECIAL CARE FACTORS FREQUENCY  PT (By licensed PT), OT (By licensed OT)     PT Frequency: min 5x weekly OT Frequency: min 5x weekly            Contractures Contractures Info: Not present    Additional Factors Info  Code Status, Allergies Code Status Info: full Allergies Info: codeine, sulfa antibiotics, sulfasalazine  Current Medications (11/27/2018):  This is the current hospital active medication list Current Facility-Administered Medications  Medication Dose Route Frequency Provider Last Rate Last Dose  . acetaminophen (TYLENOL) tablet 1,000 mg  1,000 mg Oral Q6H Ophelia Charter T, MD   1,000 mg at 11/27/18  1406  . amitriptyline (ELAVIL) tablet 200 mg  200 mg Oral QHS Barton Dubois, MD   200 mg at 11/26/18 2115  . amLODipine (NORVASC) tablet 5 mg  5 mg Oral QHS Barton Dubois, MD   5 mg at 11/26/18 2116  . aspirin EC tablet 325 mg  325 mg Oral Q breakfast Ophelia Charter T, MD   325 mg at 11/27/18 1011  . atorvastatin (LIPITOR) tablet 40 mg  40 mg Oral Daily Barton Dubois, MD   40 mg at 11/27/18 1011  . diphenhydrAMINE (BENADRYL) 12.5 MG/5ML elixir 12.5-25 mg  12.5-25 mg Oral Q4H PRN Ophelia Charter T, MD      . docusate sodium (COLACE) capsule 100 mg  100 mg Oral BID Ophelia Charter T, MD   100 mg at 11/27/18 1011  . fentaNYL (SUBLIMAZE) injection 50 mcg  50 mcg Intravenous Q2H PRN Isla Pence, MD   50 mcg at 11/25/18 1542  . hydrochlorothiazide (HYDRODIURIL) tablet 25 mg  25 mg Oral Daily Barton Dubois, MD      . lacosamide (VIMPAT) tablet 150 mg  150 mg Oral BID Barton Dubois, MD   150 mg at 11/27/18 1011  . lactated ringers infusion   Intravenous Continuous Hiram Gash, MD 50 mL/hr at 11/27/18 0300    . lactated ringers infusion   Intravenous Continuous Ellender, Karyl Kinnier, MD 10 mL/hr at 11/26/18 1013    . losartan (COZAAR) tablet 50 mg  50 mg Oral Daily Barton Dubois, MD      . menthol-cetylpyridinium (CEPACOL) lozenge 3 mg  1 lozenge Oral PRN Hiram Gash, MD       Or  . phenol (CHLORASEPTIC) mouth spray 1 spray  1 spray Mouth/Throat PRN Hiram Gash, MD      . methocarbamol (ROBAXIN) tablet 500 mg  500 mg Oral Q6H PRN Ophelia Charter T, MD   500 mg at 11/25/18 1851   Or  . methocarbamol (ROBAXIN) 500 mg in dextrose 5 % 50 mL IVPB  500 mg Intravenous Q6H PRN Hiram Gash, MD      . metoCLOPramide (REGLAN) tablet 5-10 mg  5-10 mg Oral Q8H PRN Hiram Gash, MD       Or  . metoCLOPramide (REGLAN) injection 5-10 mg  5-10 mg Intravenous Q8H PRN Ophelia Charter T, MD      . metoprolol succinate (TOPROL-XL) 24 hr tablet 200 mg  200 mg Oral Daily Barton Dubois, MD   200 mg at 11/26/18 0932  . morphine 4  MG/ML injection 2 mg  2 mg Intravenous Q3H PRN Hiram Gash, MD      . multivitamin with minerals tablet 1 tablet  1 tablet Oral Daily Barton Dubois, MD   1 tablet at 11/27/18 1011  . mupirocin ointment (BACTROBAN) 2 % 1 application  1 application Nasal BID Hiram Gash, MD   1 application at 09/81/19 1012  . ondansetron (ZOFRAN) tablet 4 mg  4 mg Oral Q6H PRN Hiram Gash, MD       Or  . ondansetron (ZOFRAN) injection 4 mg  4 mg Intravenous Q6H PRN Hiram Gash, MD      . oxyCODONE (Oxy IR/ROXICODONE) immediate release  tablet 5-10 mg  5-10 mg Oral Q3H PRN Hiram Gash, MD   10 mg at 11/27/18 0516  . phenytoin (DILANTIN) ER capsule 100 mg  100 mg Oral QHS Barton Dubois, MD   100 mg at 11/26/18 2115  . polyethylene glycol (MIRALAX / GLYCOLAX) packet 17 g  17 g Oral Daily Barton Dubois, MD   17 g at 11/27/18 1012  . QUEtiapine (SEROQUEL) tablet 600 mg  600 mg Oral QHS Barton Dubois, MD   600 mg at 11/26/18 2115     Discharge Medications: Please see discharge summary for a list of discharge medications.  Relevant Imaging Results:  Relevant Lab Results:   Additional Information SSN: [id number]  Alberteen Sam, LCSW

## 2018-11-27 NOTE — Care Management Note (Signed)
Case Management Note  Patient Details  Name: TAMON PARKERSON MRN: [id number] Date of Birth: 1956-12-03  Subjective/Objective:  62 yr old gentleman s/p IM nailing of left tibia fracture.                 Action/Plan: Case manager spoke with patient concerning discharge plan and DME. Referral for Home Health was called to Joen Laura, [facility]. Patient says he has a significant other-Sharon, who is with him 24/7.    Expected Discharge Date:  11/27/18               Expected Discharge Plan:  [place]  In-House Referral:  NA  Discharge planning Services  CM Consult  Post Acute Care Choice:  Durable Medical Equipment, Home Health Choice offered to:  Patient  DME Arranged:  3-N-1, Walker rolling DME Agency:  [place]:  PT [place] Agency:  [facility]  Status of Service:  In process, will continue to follow  If discussed at Long Length of Stay Meetings, dates discussed:    Additional Comments:  Ninfa Meeker, RN 11/27/2018, 11:43 AM

## 2018-11-27 NOTE — Evaluation (Signed)
Physical Therapy Evaluation Patient Details Name: [name] MRN: [id number] DOB: 07/11/1956 Today's Date: 11/27/2018   History of Present Illness  Patient is a 62 y/o male s/p left tibial IM nail and left tibial syndesmosis fixation. PMH significant for anxiety, depression, HTN, seizures, schizoaffective disorder.     Clinical Impression  Patient admitted with the above listed diagnosis. Patient reports that prior to admission he was Mod I with all mobility. Patient now with NWB status. Patient requiring consistent cueing/education for WB status as well as safety with AD and sequencing. Patient very unsteady with multiple LOB and required up to Mod A to prevent fall. Patient unable to ambulate away from bed while maintaining WB status and prematurely sits - all of which increases his fall risk. PT to recommend short term rehab due to deficits in functional mobility and safety. PT to continue to follow.     Follow Up Recommendations SNF;Supervision/Assistance - 24 hour    Equipment Recommendations  Rolling walker with 5" wheels    Recommendations for Other Services OT consult     Precautions / Restrictions Precautions Precautions: Fall Restrictions Weight Bearing Restrictions: Yes LLE Weight Bearing: Non weight bearing      Mobility  Bed Mobility Overal bed mobility: Needs Assistance Bed Mobility: Supine to Sit;Sit to Supine     Supine to sit: Min guard Sit to supine: Min guard   General bed mobility comments: for safety  Transfers Overall transfer level: Needs assistance Equipment used: Rolling walker (2 wheeled) Transfers: Sit to/from Stand Sit to Stand: Min assist;Mod assist         General transfer comment: heavy education on hand placement, safety, and NWB status - unable to maintain  Ambulation/Gait             General Gait Details: attempted forward stepping from bed with patient unable; unable to maintain WB status and very unsteady with high fall  risk; required up to Mod A to prevent fall  Stairs            Wheelchair Mobility    Modified Rankin (Stroke Patients Only)       Balance Overall balance assessment: Needs assistance Sitting-balance support: No upper extremity supported;Feet supported Sitting balance-Leahy Scale: Fair     Standing balance support: Bilateral upper extremity supported;During functional activity Standing balance-Leahy Scale: Poor Standing balance comment: very unsteady - Min A to maintain balance                             Pertinent Vitals/Pain Pain Assessment: No/denies pain    Home Living Family/patient expects to be discharged to:: Private residence Living Arrangements: Spouse/significant other   Type of Home: (condo) Home Access: Stairs to enter Entrance Stairs-Rails: Left Entrance Stairs-Number of Steps: 2 Home Layout: One level Home Equipment: None      Prior Function Level of Independence: Independent         Comments: retired     Journalist, newspaper        Extremity/Trunk Assessment   Upper Extremity Assessment Upper Extremity Assessment: Defer to OT evaluation    Lower Extremity Assessment Lower Extremity Assessment: Generalized weakness;LLE deficits/detail LLE Deficits / Details: NWB - difficulty maintaining    Cervical / Trunk Assessment Cervical / Trunk Assessment: Normal  Communication   Communication: No difficulties  Cognition Arousal/Alertness: Awake/alert;Lethargic Behavior During Therapy: Impulsive;Restless;Anxious Overall Cognitive Status: No family/caregiver present to determine baseline cognitive functioning  General Comments: patient unable to state month or day; following orientation questions patient perseverating on month/day and answers all following questions with a different month/year rather than what is being asked of him;       General Comments General comments (skin  integrity, edema, etc.): patient stating he was very "nervous"    Exercises     Assessment/Plan    PT Assessment Patient needs continued PT services  PT Problem List Decreased strength;Decreased activity tolerance;Decreased balance;Decreased mobility;Decreased knowledge of use of DME;Decreased safety awareness       PT Treatment Interventions DME instruction;Gait training;Stair training;Functional mobility training;Therapeutic activities;Therapeutic exercise;Balance training;Patient/family education    PT Goals (Current goals can be found in the Care Plan section)  Acute Rehab PT Goals Patient Stated Goal: none stated PT Goal Formulation: With patient Time For Goal Achievement: 12/11/18 Potential to Achieve Goals: Good    Frequency Min 5X/week   Barriers to discharge        Co-evaluation               AM-PAC PT "6 Clicks" Mobility  Outcome Measure Help needed turning from your back to your side while in a flat bed without using bedrails?: A Little Help needed moving from lying on your back to sitting on the side of a flat bed without using bedrails?: A Little Help needed moving to and from a bed to a chair (including a wheelchair)?: A Lot Help needed standing up from a chair using your arms (e.g., wheelchair or bedside chair)?: A Lot Help needed to walk in hospital room?: Total Help needed climbing 3-5 steps with a railing? : Total 6 Click Score: 12    End of Session Equipment Utilized During Treatment: Gait belt Activity Tolerance: Patient tolerated treatment well Patient left: in bed;with call bell/phone within reach;with bed alarm set Nurse Communication: Mobility status PT Visit Diagnosis: Unsteadiness on feet (R26.81);Other abnormalities of gait and mobility (R26.89);Muscle weakness (generalized) (M62.81);History of falling (Z91.81)    Time: 1751-0258 PT Time Calculation (min) (ACUTE ONLY): 37 min   Charges:   PT Evaluation $PT Eval Moderate Complexity:  1 Mod PT Treatments $Therapeutic Activity: 8-22 mins      Lanney Gins, PT, DPT Supplemental Physical Therapist 11/27/18 9:30 AM Pager: [phone] Office: [phone]

## 2018-11-27 NOTE — Plan of Care (Signed)

## 2018-11-28 LAB — CBC
HCT: 36.6 % — ABNORMAL LOW (ref 39.0–52.0)
Hemoglobin: 12.3 g/dL — ABNORMAL LOW (ref 13.0–17.0)
MCH: 31.7 pg (ref 26.0–34.0)
MCHC: 33.6 g/dL (ref 30.0–36.0)
MCV: 94.3 fL (ref 80.0–100.0)
Platelets: 156 10*3/uL (ref 150–400)
RBC: 3.88 MIL/uL — ABNORMAL LOW (ref 4.22–5.81)
RDW: 13.5 % (ref 11.5–15.5)
WBC: 8.7 10*3/uL (ref 4.0–10.5)
nRBC: 0 % (ref 0.0–0.2)

## 2018-11-28 LAB — BASIC METABOLIC PANEL
Anion gap: 10 (ref 5–15)
BUN: 18 mg/dL (ref 8–23)
CO2: 23 mmol/L (ref 22–32)
Calcium: 8.5 mg/dL — ABNORMAL LOW (ref 8.9–10.3)
Chloride: 106 mmol/L (ref 98–111)
Creatinine, Ser: 0.95 mg/dL (ref 0.61–1.24)
GFR calc Af Amer: >60 mL/min (ref >60)
GFR calc non Af Amer: >60 mL/min (ref >60)
Glucose, Bld: 120 mg/dL — ABNORMAL HIGH (ref 70–99)
Potassium: 4.2 mmol/L (ref 3.5–5.1)
Sodium: 139 mmol/L (ref 135–145)

## 2018-11-28 NOTE — Care Management Note (Signed)
Case Management Note  Patient Details  Name: [name] MRN: [id number] Date of Birth: 1956/05/11  Subjective/Objective: 62 yr old gentleman s/p Left tibia IM nailing.                   Action/Plan: Case manager spoke with patient and significant other concerning discharge plan. Patient has adamantly refused SNF for shortterm rehab, he says he doesn't do well in that setting. Referral was called to several [facility] with [facility] was able to accept patient for Home Health.    Expected Discharge Date:  pending               Expected Discharge Plan:  [name]  In-House Referral:  NA  Discharge planning Services  CM Consult  Post Acute Care Choice:  Durable Medical Equipment, Home Health Choice offered to:  Patient  DME Arranged:  3-N-1, Walker rolling DME Agency:  [place]:  PT/SW [place]:  [place]  Status of Service:  Completed, signed off  If discussed at [place] of Stay Meetings, dates discussed:    Additional Comments:  Ninfa Meeker, RN 11/28/2018, 4:10 PM

## 2018-11-28 NOTE — Progress Notes (Signed)
.  CSW met with patient and fiance at bedside. Patient continues to refuse SNF and report he will not be going to a facility. His fiance reports she will be staying with patient until he gets stronger, and inquired regarding home health. CSW explained [place] services and they report they would like home health with a social worker, therefore if they get home and have the realization they are unable to manage mobility issues, they can consult with social worker to go from home to SNF if needed. Patient and fiance on board with this plan and very happy.   CSW consulted RNCM who is following up on finding home health agency to take medicaid.    [place], [place]

## 2018-11-28 NOTE — Discharge Summary (Signed)
Physician Discharge Summary  Patient ID: [name] MRN: [id number] DOB/AGE: 64-May-1957 64 y.o.  Admit date: 11/25/2018 Discharge date: 11/28/2018  Admission Diagnoses:  Closed tibia fracture  Discharge Diagnoses:  Principal Problem:   Closed tibia fracture Active Problems:   Hyperlipidemia   Seizure disorder ([place])   Constipation, chronic   Schizoaffective disorder ([place])   Essential hypertension   Fall   MDD (major depressive disorder), recurrent severe, without psychosis ([place])   Fracture of tibial shaft, left, closed   Past Medical History:  Diagnosis Date  . Anxiety   . Constipation   . Depression   . Dyslipidemia   . GERD (gastroesophageal reflux disease)   . Hearing loss   . History of echocardiogram    Echo 8/18: EF 50-55, normal wall motion, grade 1 diastolic dysfunction, mild MR  . Hypertension   . Memory difficulty 10/25/2017  . Memory loss   . MRSA carrier   . Schizoaffective disorder   . Seizures ([place])     Surgeries: Procedure(s): INTRAMEDULLARY (IM) NAIL TIBIAL OPEN REDUCTION INTERNAL FIXATION (ORIF) ANKLE FRACTURE on 11/26/2018   Consultants (if any): Treatment Team:  Hiram Gash, MD  Discharged Condition: Improved  Hospital Course: [name] is an 62 y.o. male who was admitted 11/25/2018 with a diagnosis of Closed tibia fracture and went to the operating room on 11/26/2018 and underwent the above named procedures.    He was given perioperative antibiotics:  Anti-infectives (From admission, onward)   Start     Dose/Rate Route Frequency Ordered Stop   11/26/18 1730  ceFAZolin (ANCEF) IVPB 2g/100 mL premix     2 g 200 mL/hr over 30 Minutes Intravenous Every 6 hours 11/26/18 1438 11/27/18 0016   11/26/18 1239  vancomycin (VANCOCIN) powder  Status:  Discontinued       As needed 11/26/18 1240 11/26/18 1322   11/26/18 1100  ceFAZolin (ANCEF) IVPB 2g/100 mL premix     2 g 200 mL/hr over 30 Minutes Intravenous On call to O.R. 11/25/18 1740  11/26/18 1203   11/26/18 0600  ceFAZolin (ANCEF) IVPB 2g/100 mL premix  Status:  Discontinued     2 g 200 mL/hr over 30 Minutes Intravenous On call to O.R. 11/25/18 1740 11/25/18 1818    .  He was given sequential compression devices, early ambulation, and aspirin for DVT prophylaxis.  He benefited maximally from the hospital stay and there were no complications.    Recent vital signs:  Vitals:   11/28/18 0950 11/28/18 1457  BP: 131/74 123/70  Pulse: 91 74  Resp:  18  Temp:  98.8 F (37.1 C)  SpO2:  96%    Recent laboratory studies:  Lab Results  Component Value Date   HGB 12.3 (L) 11/28/2018   HGB 13.8 11/27/2018   HGB 14.7 11/26/2018   Lab Results  Component Value Date   WBC 8.7 11/28/2018   PLT 156 11/28/2018   Lab Results  Component Value Date   INR 1.20 08/04/2018   Lab Results  Component Value Date   NA 139 11/28/2018   K 4.2 11/28/2018   CL 106 11/28/2018   CO2 23 11/28/2018   BUN 18 11/28/2018   CREATININE 0.95 11/28/2018   GLUCOSE 120 (H) 11/28/2018    Discharge Medications:   Allergies as of 11/28/2018      Reactions   Codeine Nausea And Vomiting   Sulfa Antibiotics Nausea And Vomiting   Sulfasalazine Nausea And Vomiting  Medication List    TAKE these medications   acetaminophen 500 MG tablet Commonly known as:  TYLENOL Take 2 tablets (1,000 mg total) by mouth every 8 (eight) hours for 14 days.   amitriptyline 100 MG tablet Commonly known as:  ELAVIL Take 2 tablets (200 mg total) by mouth at bedtime.   amLODipine 5 MG tablet Commonly known as:  NORVASC Take 1 tablet (5 mg total) by mouth daily. What changed:  when to take this   aspirin EC 325 MG tablet Take 1 tablet (325 mg total) by mouth daily. What changed:    medication strength  how much to take   atorvastatin 40 MG tablet Commonly known as:  LIPITOR Take 1 tablet (40 mg total) by mouth daily. What changed:  when to take this   clonazePAM 1 MG tablet Commonly  known as:  KLONOPIN Take 1 tablet (1 mg total) by mouth at bedtime.   hydrochlorothiazide 25 MG tablet Commonly known as:  HYDRODIURIL Take 1 tablet (25 mg total) by mouth daily. What changed:  when to take this   Lacosamide 150 MG Tabs Commonly known as:  VIMPAT Take 1 tablet (150 mg total) by mouth 2 (two) times daily.   losartan 50 MG tablet Commonly known as:  COZAAR TAKE 1 TABLET BY MOUTH EVERY DAY What changed:  when to take this   metoprolol succinate 100 MG 24 hr tablet Commonly known as:  TOPROL-XL TAKE 2 TABLETS (200 MG TOTAL) DAILY BY MOUTH. What changed:  when to take this   multivitamin with minerals Tabs tablet Take 1 tablet by mouth daily.   ondansetron 4 MG tablet Commonly known as:  ZOFRAN Take 1 tablet (4 mg total) by mouth every 8 (eight) hours as needed for up to 7 days for nausea or vomiting.   oxyCODONE 5 MG immediate release tablet Commonly known as:  Oxy IR/ROXICODONE Take 1-2 pills every 6 hrs as needed for pain, no more than 6 per day   phenytoin 100 MG ER capsule Commonly known as:  DILANTIN Take 1 capsule (100 mg total) by mouth at bedtime.   polyethylene glycol powder powder Commonly known as:  [employer] PURELAX TAKE 17 GRAMS 2 TIMES A DAY AS NEEDED FOR CONSTIPATION. MIX INTO 8 OF FLUID AND DRINK What changed:    how much to take  how to take this  when to take this  additional instructions   QUEtiapine 300 MG tablet Commonly known as:  SEROQUEL Take 2 tablets (600 mg total) by mouth at bedtime.   testosterone cypionate 200 MG/ML injection Commonly known as:  DEPOTESTOSTERONE CYPIONATE Inject 0.75 mLs (150 mg total) into the muscle every 14 (fourteen) days.       Diagnostic Studies: Dg Tibia/fibula Left  Result Date: 11/26/2018 CLINICAL DATA:  Left tibia ORIF. EXAM: DG C-ARM 61-120 MIN; LEFT TIBIA AND FIBULA - 2 VIEW COMPARISON:  11/25/2018 FLUOROSCOPY TIME:  C-arm fluoroscopic images were obtained intraoperatively and submitted  for post operative interpretation. Please see the performing provider's procedural report for the fluoroscopy time utilized. FINDINGS: Five intraoperative fluoroscopic images of the left lower leg are provided. An intramedullary nail has been placed across the distal metadiaphyseal tibial fracture. Proximal and distal interlocking screws are present. Fracture alignment is now near anatomic. A proximal fibular shaft fracture remains mildly displaced. IMPRESSION: Intraoperative images during ORIF of left tibia fracture. Electronically Signed   By: Logan Bores M.D.   On: 11/26/2018 12:59   Dg Tibia/fibula Left  Result Date: 11/25/2018 CLINICAL DATA:  Left tibia and fibular pain status post fall this morning. EXAM: LEFT TIBIA AND FIBULA - 2 VIEW COMPARISON:  None. FINDINGS: The patient has sustained a spiral fracture of the distal tibial diaphysis exhibiting mild displacement. There is a spiral fracture of the proximal third of the fibula which is also displaced. The observed portions of the distal femur are normal. The malleoli are not well demonstrated on this study. IMPRESSION: Mildly displaced distal tibial shaft fracture. Moderately displaced proximal fibular shaft fracture. A left ankle series is recommended to assess the malleoli more completely. Electronically Signed   By: David  Martinique M.D.   On: 11/25/2018 09:37   Dg Ankle Complete Left  Result Date: 11/25/2018 CLINICAL DATA:  Left lower leg pain.  Known tib fib fracture EXAM: LEFT ANKLE COMPLETE - 3+ VIEW COMPARISON:  11/25/2018 FINDINGS: Comminuted, displaced distal metadiaphyseal fracture in the left tibia, stable since prior study. Previously seen proximal fibular fracture not visualized. Well corticated bone fragment at the tip of the medial malleolus felt represent old injury or secondary ossification center. IMPRESSION: Comminuted, displaced distal tibial metadiaphyseal fracture. Electronically Signed   By: Rolm Baptise M.D.   On: 11/25/2018  10:27   Ct Ankle Left Wo Contrast  Result Date: 11/25/2018 CLINICAL DATA:  Trip and fall at 7 a.m. the patient suffered a trip and fall injury at 7 a.m. this morning resulting in a left lower leg fracture. Initial encounter. EXAM: CT OF THE LEFT ANKLE WITHOUT CONTRAST TECHNIQUE: Multidetector CT imaging of the left ankle was performed according to the standard protocol. Multiplanar CT image reconstructions were also generated. COMPARISON:  Plain films left ankle today. FINDINGS: Bones/Joint/Cartilage As seen on the comparison plain films, there is a spiral fracture of the distal left tibia. The fracture extends from 14.5 cm above the plafond and in a distal orientation with the main fracture line exiting the medial cortex of the diaphysis 6 cm above the tip of the medial malleolus, just superior to the metaphysis. There is slight lateral displacement of the main fracture fragment at the fracture line through the medial diaphysis and the superior fracture margins are distracted up to 0.5 cm. Below the main fracture line through the medial cortex of the diaphysis, a nondisplaced fracture line continues through the posterior cortex of the tibia to the metaphysis. The patient also has a fracture of the lateral corner of the anterior distal tibia where fragment measuring 1 cm AP by 0.6 cm transverse by 1.1 cm craniocaudal is identified. This fracture fragment is at the attachment site of the anterior tibiofibular ligament. Tiny well corticated ossicle off the medial malleolus is noted. No other fracture is identified. Imaged fibula is intact. Ligaments Suboptimally assessed by CT. Muscles and Tendons Intact.  No tendon entrapment. Soft tissues Mild subcutaneous edema noted. IMPRESSION: Mildly displaced spiral fracture of the distal tibia extends from the lateral cortex of the diaphysis through the medial cortex of the diaphysis just proximal to the metaphysis. Nondisplaced component of the fracture extending more  distally in the posterior cortex of the tibia noted. Small fracture off the anterior aspect of the lateral cortex of the distal tibia at the expected attachment site of the anterior tibiofibular ligament. The syndesmosis is not widened. Electronically Signed   By: Inge Rise M.D.   On: 11/25/2018 12:45   Dg Tibia/fibula Left Port  Result Date: 11/26/2018 CLINICAL DATA:  Status post intramedullary rod fixation of left tibial fracture. EXAM: PORTABLE LEFT  TIBIA AND FIBULA - 2 VIEW COMPARISON:  Radiographs of November 25, 2018. FINDINGS: Status post intramedullary rod fixation of distal left tibial fracture. Good alignment of fracture components is noted. Stable mildly displaced oblique fracture seen involving the proximal left fibula. IMPRESSION: Status post intramedullary rod fixation of distal left tibial fracture with improved alignment of fracture components. Electronically Signed   By: Marijo Conception, M.D.   On: 11/26/2018 14:06   Dg Ankle Left Port  Result Date: 11/26/2018 CLINICAL DATA:  Status post surgical fixation distal tibial fracture. EXAM: PORTABLE LEFT ANKLE - 2 VIEW COMPARISON:  Radiographs of November 25, 2018. FINDINGS: Status post intramedullary rod fixation of distal left tibial fracture with improved alignment of fracture components. No other significant bony abnormality is noted. IMPRESSION: Status post intramedullary rod fixation of distal left tibial fracture. Electronically Signed   By: Marijo Conception, M.D.   On: 11/26/2018 14:07   Dg C-arm 1-60 Min  Result Date: 11/26/2018 CLINICAL DATA:  Left tibia ORIF. EXAM: DG C-ARM 61-120 MIN; LEFT TIBIA AND FIBULA - 2 VIEW COMPARISON:  11/25/2018 FLUOROSCOPY TIME:  C-arm fluoroscopic images were obtained intraoperatively and submitted for post operative interpretation. Please see the performing provider's procedural report for the fluoroscopy time utilized. FINDINGS: Five intraoperative fluoroscopic images of the left lower leg  are provided. An intramedullary nail has been placed across the distal metadiaphyseal tibial fracture. Proximal and distal interlocking screws are present. Fracture alignment is now near anatomic. A proximal fibular shaft fracture remains mildly displaced. IMPRESSION: Intraoperative images during ORIF of left tibia fracture. Electronically Signed   By: Logan Bores M.D.   On: 11/26/2018 12:59    Disposition:   Discharge Instructions    Call MD for:  persistant nausea and vomiting   Complete by:  As directed    Call MD for:  redness, tenderness, or signs of infection (pain, swelling, redness, odor or green/yellow discharge around incision site)   Complete by:  As directed    Call MD for:  severe uncontrolled pain   Complete by:  As directed    Diet - low sodium heart healthy   Complete by:  As directed    Discharge instructions   Complete by:  As directed    Ophelia Charter MD, MPH [facility]. [address], [address] [phone] (tel)   [phone] (fax)   POST-OPERATIVE INSTRUCTIONS   WOUND CARE Please keep splint clean dry and intact until followup.  You may shower on Post-Op Day #2. You must keep splint dry during this process and may find that a plastic bag taped around the leg or alternatively a towel based bath may be a better option.  If you get your splint wet or if it is damaged please contact our clinic.  EXERCISES Due to your splint being in place you will not be able to bear weight through your extremity.  Please use crutches or a walker to avoid weight bearing.   POST-OP MEDICINES A multi-modal approach will be used to treat your pain. Oxycodone - This is a strong narcotic, to be used only on an "as needed" basis for pain. Acetaminophen 500mg - A non-narcotic pain medicine.  Use 1000mg  three times a day for the first 14 days after surgery   Zofran 4mg  - This is an anti-nausea medicine to be used only if you are having nausea or vomitting. Aspirin  325mg  - This is a medicine used to help prevent blood clots.  Please use  it daily.   If you have any adverse effects with the medications, please call our office.  FOLLOW-UP If you develop a Fever (>101.5), Redness or Drainage from the surgical incision site, please call our office to arrange for an evaluation. Please call the office to schedule a follow-up appointment for your suture removal, 10-14 days post-operatively.  IF YOU HAVE ANY QUESTIONS, PLEASE FEEL FREE TO CALL OUR OFFICE.  HELPFUL INFORMATION You should wean off your narcotic medicines as soon as you are able.  Most patients will be off or using minimal narcotics before their first postop appointment.    We suggest you use the pain medication the first night prior to going to bed, in order to ease any pain when the anesthesia wears off. You should avoid taking pain medications on an empty stomach as it will make you nauseous.  Do not drink alcoholic beverages or take illicit drugs when taking pain medications.  In most states it is against the law to drive while you are in a splint or sling.  And certainly against the law to drive while taking narcotics.  You may return to work/school in the next couple of days when you feel up to it.    Pain medication may make you constipated.  Below are a few solutions to try in this order: Decrease the amount of pain medication if you aren't having pain. Drink lots of decaffeinated fluids. Drink prune juice and/or each dried prunes  If the first 3 don't work start with additional solutions Take Colace - an over-the-counter stool softener Take Senokot - an over-the-counter laxative Take Miralax - a stronger over-the-counter laxative   Increase activity slowly   Complete by:  As directed       Follow-up Information    [facility] [place] Follow up.   Specialty:  [place] Why:  A representative from [facility] will contact you to arrange start date and time for  your therapy. Contact information: [place] [address] [phone]            Signed: Johnny Bridge 11/28/2018, 6:00 PM

## 2018-11-28 NOTE — Plan of Care (Signed)
Problem: Education: Goal: Knowledge of General Education information will improve Description Including pain rating scale, medication(s)/side effects and non-pharmacologic comfort measures Outcome: Progressing   Problem: Health Behavior/Discharge Planning: Goal: Ability to manage health-related needs will improve Outcome: Progressing   Problem: Clinical Measurements: Goal: Ability to maintain clinical measurements within normal limits will improve Outcome: Progressing Goal: Respiratory complications will improve Outcome: Progressing   Problem: Nutrition: Goal: Adequate nutrition will be maintained Outcome: Progressing   Problem: Coping: Goal: Level of anxiety will decrease Outcome: Progressing   Problem: Pain Managment: Goal: General experience of comfort will improve Outcome: Progressing   Problem: Safety: Goal: Ability to remain free from injury will improve Outcome: Progressing   Problem: Skin Integrity: Goal: Risk for impaired skin integrity will decrease Outcome: Progressing

## 2018-11-28 NOTE — Plan of Care (Signed)

## 2018-11-28 NOTE — Progress Notes (Signed)
Physical Therapy Treatment Patient Details Name: [name] MRN: [id number] DOB: [birth year] Today's Date: 11/28/2018    History of Present Illness Patient is a 62 y/o male s/p left tibial IM nail and left tibial syndesmosis fixation. PMH significant for anxiety, depression, HTN, seizures, schizoaffective disorder.     PT Comments    Patient seen for mobility progression. Pt requires +2 assist for functional transfers from EOB and recliner and able to tolerate gait training for 30 ft with min/mod A (+2 for safety/chair follow). Stairs practiced this session simulating home entrance and pt requires at least mod A +2 however even with +2 assist this was very difficult and pt had LOB on second step needing increased assist to gain balance. Pt reports that his significant other is very limited in how much physical assistance she can provide. Given pt's current mobility level and decreased insight into safety and deficits continue to recommend SNF for further skilled PT services. However pt is declining post acute rehab because he is concerned about not being able to sleep well unless he is at home. Continue to progress as tolerated.    Follow Up Recommendations  SNF;Supervision/Assistance - 24 hour     Equipment Recommendations  Rolling walker with 5" wheels    Recommendations for Other Services OT consult     Precautions / Restrictions Precautions Precautions: Fall Restrictions Weight Bearing Restrictions: Yes LLE Weight Bearing: Non weight bearing    Mobility  Bed Mobility Overal bed mobility: Needs Assistance Bed Mobility: Supine to Sit     Supine to sit: Min guard     General bed mobility comments: min guard for safety; use of rails and HOB elevated slightly   Transfers Overall transfer level: Needs assistance Equipment used: Rolling walker (2 wheeled) Transfers: Sit to/from Stand Sit to Stand: Mod assist;Min assist;+2 physical assistance         General transfer  comment: cues for L LE positioning to maintain NWB and for safe hand placement   Ambulation/Gait Ambulation/Gait assistance: Min assist;Mod assist;+2 safety/equipment Gait Distance (Feet): 30 Feet Assistive device: Rolling walker (2 wheeled) Gait Pattern/deviations: Step-to pattern Gait velocity: decreased   General Gait Details: cues for safe use of AD and to maintain L LE NWB; assist for balance    Stairs Stairs: Yes Stairs assistance: Mod assist;+2 physical assistance Stair Management: No rails;Step to pattern;Backwards;With walker Number of Stairs: 2 General stair comments: sequencing and technique demonstrated X 2 prior to pt's attempt and pt requires max cues for sequencing and technique while practicing; +2 assist required; pt has a lot of difficulty and unsafe footing when practicing stairs   Wheelchair Mobility    Modified Rankin (Stroke Patients Only)       Balance Overall balance assessment: Needs assistance Sitting-balance support: No upper extremity supported;Feet supported Sitting balance-Leahy Scale: Fair     Standing balance support: Bilateral upper extremity supported;During functional activity Standing balance-Leahy Scale: Poor                              Cognition Arousal/Alertness: Awake/alert Behavior During Therapy: Impulsive;Restless;Anxious Overall Cognitive Status: No family/caregiver present to determine baseline cognitive functioning Area of Impairment: Memory;Following commands;Safety/judgement;Problem solving                     Memory: Decreased short-term memory Following Commands: Follows one step commands inconsistently Safety/Judgement: Decreased awareness of safety;Decreased awareness of deficits   Problem Solving: Requires verbal  cues;Requires tactile cues;Difficulty sequencing General Comments: pt's cognition is likely baseline      Exercises      General Comments        Pertinent Vitals/Pain Pain  Assessment: Faces Faces Pain Scale: Hurts little more Pain Location: L LE Pain Descriptors / Indicators: Grimacing;Guarding;Sore Pain Intervention(s): Limited activity within patient's tolerance;Monitored during session;Repositioned    Home Living                      Prior Function            PT Goals (current goals can now be found in the care plan section) Acute Rehab PT Goals Patient Stated Goal: none stated Progress towards PT goals: Progressing toward goals    Frequency    Min 5X/week      PT Plan Current plan remains appropriate    Co-evaluation              AM-PAC PT "6 Clicks" Mobility   Outcome Measure  Help needed turning from your back to your side while in a flat bed without using bedrails?: A Little Help needed moving from lying on your back to sitting on the side of a flat bed without using bedrails?: A Lot Help needed moving to and from a bed to a chair (including a wheelchair)?: A Lot Help needed standing up from a chair using your arms (e.g., wheelchair or bedside chair)?: A Lot Help needed to walk in hospital room?: A Lot Help needed climbing 3-5 steps with a railing? : A Lot 6 Click Score: 13    End of Session Equipment Utilized During Treatment: Gait belt Activity Tolerance: Patient tolerated treatment well Patient left: with call bell/phone within reach;in chair Nurse Communication: Mobility status PT Visit Diagnosis: Unsteadiness on feet (R26.81);Other abnormalities of gait and mobility (R26.89);Muscle weakness (generalized) (M62.81);History of falling (Z91.81)     Time: 7048-8891 PT Time Calculation (min) (ACUTE ONLY): 32 min  Charges:  $Gait Training: 23-37 mins                     Earney Navy, PTA Acute Rehabilitation Services Pager: [phone] Office: [phone]     Darliss Cheney 11/28/2018, 11:37 AM

## 2018-11-28 NOTE — Progress Notes (Signed)
ORTHOPAEDIC PROGRESS NOTE  s/p Procedure(s): INTRAMEDULLARY (IM) NAIL TIBIAL OPEN REDUCTION INTERNAL FIXATION (ORIF) ANKLE FRACTURE   SUBJECTIVE: Reports mild pain about operative site. No chest pain. No SOB. No nausea/vomiting. No other complaints.  OBJECTIVE: Pe: left lower extremity: dressing CDI, leg lengths equal, warm well perfused foot, intact EHL/TA/GSC   Vitals:   11/28/18 0454 11/28/18 0950  BP: (!) 140/99 131/74  Pulse: 93 91  Resp: 18   Temp: 98.3 F (36.8 C)   SpO2: 96%      ASSESSMENT: [name] is a 62 y.o. male doing well postoperatively.  PLAN: Weightbearing: TDWB LLE  Insicional and dressing care: Dressing in place till followup with boot in place till then. Orthopedic device(s): boot Showering: PRN, keep dressing dry VTE prophylaxis: Aspirin 325mg  BID x6 weeks Pain control: PRN meds, minimize narcs Follow - up plan: 2 weeks with XR in clinic Contact information:  Weekdays 8-5 Ophelia Charter MD [phone], After hours and holidays please check [email or web address] for group call information for [facility]   OK for dispo from orthopedic perspective when bed available.  Followup with PCP and neurology outpatient appropriate

## 2018-11-28 NOTE — Progress Notes (Signed)
Pt's fiance at bedside and refused pt be discharged home unless it was by ambulance. RN attempted to call social worker and case manager, but still unsuccessful at [employer]. Belva Chimes, Surveyor, quantity aware. Will continue to monitor.

## 2018-11-29 LAB — CBC
HCT: 39.1 % (ref 39.0–52.0)
Hemoglobin: 13.1 g/dL (ref 13.0–17.0)
MCH: 32.1 pg (ref 26.0–34.0)
MCHC: 33.5 g/dL (ref 30.0–36.0)
MCV: 95.8 fL (ref 80.0–100.0)
Platelets: 166 10*3/uL (ref 150–400)
RBC: 4.08 MIL/uL — ABNORMAL LOW (ref 4.22–5.81)
RDW: 13.6 % (ref 11.5–15.5)
WBC: 8.4 10*3/uL (ref 4.0–10.5)
nRBC: 0 % (ref 0.0–0.2)

## 2018-11-29 NOTE — Progress Notes (Signed)
Patient discharging home. Pt fiance picked up DME. Pt going home via [facility].

## 2018-12-01 ENCOUNTER — Telehealth: Payer: Self-pay

## 2018-12-01 NOTE — Telephone Encounter (Signed)
Called and gave verbal order to [facility].  [name], [place]

## 2018-12-01 NOTE — Telephone Encounter (Signed)
Please authorize this order [name] Rio, MD

## 2018-12-01 NOTE — Telephone Encounter (Signed)
Deanna, nurse with [facility], called nurse line requesting VO for PT evaluation. Please call her [phone] with verbal ok.

## 2018-12-04 ENCOUNTER — Telehealth: Payer: Self-pay | Admitting: Family Medicine

## 2018-12-04 NOTE — Telephone Encounter (Signed)
A nurse called stating this pt is requesting personal care services. Pt recently got out of the hospital from breaking leg and is needing help at home. She will be faxing over a form of what needs to be done because it would have to go through [employer].

## 2018-12-05 NOTE — Telephone Encounter (Signed)
I have received the request from [facility], but unfortunately patient will need to come in for an office visit. Personal care services forms have to be filled out within 90 days of the last visit and his last visit was >90 days ago.  Please have patient schedule follow up with me or another physician. Thanks, Leeanne Rio, MD

## 2018-12-05 NOTE — Telephone Encounter (Signed)
**  After Hours/ Emergency Line Call*  Received a call to report that [name] is having LLE swelling. He is s/p a tibial fracture which was recently repaired by orthopedics. He is not feeling a lot of pain and is not having fevers or drainage. However, he feels that the swelling in his leg is more than he would expect. Discussed with patient that I cannot evaluate him without seeing him. It is possible that this is normal post-operative swelling, however cannot rule out DVT or other concerning etiology. Patient should go to the ED. He states that he will go. Red flags discussed.  Will forward to PCP.  Everrett Coombe, MD PGY-3 [facility]

## 2018-12-08 ENCOUNTER — Telehealth: Payer: Self-pay

## 2018-12-08 NOTE — Telephone Encounter (Signed)
Attempted to call patient to schedule an appointment with Dr. Ardelia Mems regarding paperwork for home health care.  There was no answer so a voice mail message was left informing patient to call and schedule and appointment with any provider if Dr. Ardelia Mems is not available.  Paperwork has to be completed within 90 days of last visit per Dr. Ardelia Mems.  Ozella Almond, [place]

## 2018-12-12 ENCOUNTER — Telehealth: Payer: Self-pay | Admitting: Family Medicine

## 2018-12-12 NOTE — Telephone Encounter (Signed)
Pt called asking he needs help around his house and was wondering to get orders to get a nurse to help him. ad

## 2018-12-15 NOTE — Telephone Encounter (Signed)
Rayburn Ma, nurse care manager with [facility], calling to see if [facility] services will be able to be obtained. She would also like to go over patient's medication list.  Her call back is [phone]  Danley Danker, RN [facility] RN)

## 2018-12-15 NOTE — Telephone Encounter (Signed)
If patient needs help around the house, that is a request for "personal care services". He needs an office visit before we can order that - we previously tried to reach him to have him schedule an appointment since that can't be ordered unless he has been seen within the last 90 days.  Please have patient schedule an appointment with me or any other provider [facility] at [facility]  Thanks, Leeanne Rio, MD

## 2018-12-18 ENCOUNTER — Telehealth: Payer: Self-pay

## 2018-12-18 NOTE — Telephone Encounter (Signed)
Caleen Jobs, nurse care manager with [facility], again calling about this patient. Left message she is re-sending PCS form and the patient's reported med list that she would like to discuss. Perhaps she has a different contact number for patient to reach him for appointment.  Cathy call back is [phone]  Danley Danker, RN [facility] RN)

## 2018-12-18 NOTE — Telephone Encounter (Signed)
Spoke to [employer], Therapist, sports and she says that she will call patient and get him to schedule and appointment in order to get PCS.  She had additional questions about his medication list so I gave the phone to Dr. Ardelia Mems.  Ozella Almond, [place]

## 2018-12-24 ENCOUNTER — Telehealth: Payer: Self-pay | Admitting: *Deleted

## 2018-12-24 MED ORDER — LOSARTAN POTASSIUM 25 MG PO TABS: 50.0000 mg | ORAL_TABLET | Freq: Every day | ORAL | 0 refills | Status: DC

## 2018-12-24 NOTE — Telephone Encounter (Signed)
Sent in new rx for patient to take two 25mg  tablets. Patient needs an appointment for follow up.  Leeanne Rio, MD

## 2018-12-24 NOTE — Telephone Encounter (Signed)
Called and LVM for patient concerning new RX and to call and schedule a follow up appointment with Dr. Ardelia Mems.  Ozella Almond, [place]

## 2018-12-24 NOTE — Telephone Encounter (Signed)
Received message from pharmacy that losartan 50mg  was on backorder.  25mg  and 100mg  are available. Requesting we send a new script. Mahmud Keithly, Salome Spotted, CMA

## 2018-12-31 ENCOUNTER — Other Ambulatory Visit: Payer: Self-pay | Admitting: Adult Health

## 2019-01-01 ENCOUNTER — Telehealth: Payer: Self-pay

## 2019-01-01 NOTE — Telephone Encounter (Signed)
Please authorize orders. Please also ask patient again to schedule visit with me, thanks Leeanne Rio, MD

## 2019-01-01 NOTE — Telephone Encounter (Signed)
LMOVM informing charisse of verbal orders and told her to call back if she had any questions. Keron Neenan Kennon Holter, CMA

## 2019-01-01 NOTE — Telephone Encounter (Signed)
Charisse, PT with [facility], called for verbal orders:  HH PT 2x/week x 4 weeks: Manual wheelchair  States patient non-compliant with weightbearing status.  Call back is [phone] (ok to leave msg).  Danley Danker, RN [facility] RN)

## 2019-01-13 ENCOUNTER — Emergency Department (HOSPITAL_COMMUNITY)
Admission: EM | Admit: 2019-01-13 | Discharge: 2019-01-13 | Disposition: A | Discharge status: Home / Self Care | Payer: Medicaid Other | Attending: Emergency Medicine | Admitting: Emergency Medicine

## 2019-01-13 ENCOUNTER — Emergency Department (HOSPITAL_COMMUNITY): Payer: Medicaid Other

## 2019-01-13 ENCOUNTER — Encounter (HOSPITAL_COMMUNITY): Payer: Self-pay | Admitting: Student

## 2019-01-13 ENCOUNTER — Other Ambulatory Visit: Payer: Self-pay

## 2019-01-13 DIAGNOSIS — I10 Essential (primary) hypertension: Secondary | ICD-10-CM | POA: Diagnosis not present

## 2019-01-13 DIAGNOSIS — N3001 Acute cystitis with hematuria: Secondary | ICD-10-CM | POA: Diagnosis not present

## 2019-01-13 DIAGNOSIS — R569 Unspecified convulsions: Secondary | ICD-10-CM | POA: Diagnosis present

## 2019-01-13 DIAGNOSIS — Z79899 Other long term (current) drug therapy: Secondary | ICD-10-CM | POA: Diagnosis not present

## 2019-01-13 LAB — CBC WITH DIFFERENTIAL/PLATELET
Abs Immature Granulocytes: 0.05 10*3/uL (ref 0.00–0.07)
Basophils Absolute: 0 10*3/uL (ref 0.0–0.1)
Basophils Relative: 0 %
Eosinophils Absolute: 0 10*3/uL (ref 0.0–0.5)
Eosinophils Relative: 0 %
HCT: 44.6 % (ref 39.0–52.0)
Hemoglobin: 15 g/dL (ref 13.0–17.0)
Immature Granulocytes: 1 %
Lymphocytes Relative: 11 %
Lymphs Abs: 0.8 10*3/uL (ref 0.7–4.0)
MCH: 32.7 pg (ref 26.0–34.0)
MCHC: 33.6 g/dL (ref 30.0–36.0)
MCV: 97.2 fL (ref 80.0–100.0)
Monocytes Absolute: 0.4 10*3/uL (ref 0.1–1.0)
Monocytes Relative: 5 %
Neutro Abs: 6.5 10*3/uL (ref 1.7–7.7)
Neutrophils Relative %: 83 %
Platelets: 188 10*3/uL (ref 150–400)
RBC: 4.59 MIL/uL (ref 4.22–5.81)
RDW: 14.6 % (ref 11.5–15.5)
WBC: 7.9 10*3/uL (ref 4.0–10.5)
nRBC: 0 % (ref 0.0–0.2)

## 2019-01-13 LAB — URINALYSIS, ROUTINE W REFLEX MICROSCOPIC
Bilirubin Urine: NEGATIVE
Glucose, UA: NEGATIVE mg/dL
Ketones, ur: NEGATIVE mg/dL
Nitrite: POSITIVE — AB
Protein, ur: NEGATIVE mg/dL
Specific Gravity, Urine: 1.01 (ref 1.005–1.030)
WBC, UA: >50 WBC/hpf — ABNORMAL HIGH (ref 0–5)
pH: 7 (ref 5.0–8.0)

## 2019-01-13 LAB — COMPREHENSIVE METABOLIC PANEL
ALT: 15 U/L (ref 0–44)
AST: 35 U/L (ref 15–41)
Albumin: 4.2 g/dL (ref 3.5–5.0)
Alkaline Phosphatase: 122 U/L (ref 38–126)
Anion gap: 7 (ref 5–15)
BUN: 15 mg/dL (ref 8–23)
CO2: 24 mmol/L (ref 22–32)
Calcium: 8.7 mg/dL — ABNORMAL LOW (ref 8.9–10.3)
Chloride: 104 mmol/L (ref 98–111)
Creatinine, Ser: 1.14 mg/dL (ref 0.61–1.24)
GFR calc Af Amer: >60 mL/min (ref >60)
GFR calc non Af Amer: >60 mL/min (ref >60)
Glucose, Bld: 109 mg/dL — ABNORMAL HIGH (ref 70–99)
Potassium: 4.6 mmol/L (ref 3.5–5.1)
Sodium: 135 mmol/L (ref 135–145)
Total Bilirubin: 1.6 mg/dL — ABNORMAL HIGH (ref 0.3–1.2)
Total Protein: 7.1 g/dL (ref 6.5–8.1)

## 2019-01-13 MED ORDER — SODIUM CHLORIDE 0.9 % IV SOLN
1.0000 g | Freq: Once | INTRAVENOUS | Status: AC
  Administered 2019-01-13: 1 g via INTRAVENOUS
  Filled 2019-01-13: qty 10

## 2019-01-13 MED ORDER — CEPHALEXIN 500 MG PO CAPS: 500.0000 mg | ORAL_CAPSULE | Freq: Four times a day (QID) | ORAL | 0 refills | Status: DC

## 2019-01-13 NOTE — Discharge Instructions (Addendum)
You were seen in the ER today for a seizure.  Your urine showed that you have a UTI which may have caused this. WE are treating the UTI with keflex, an antibiotic.   We have prescribed you new medication(s) today. Discuss the medications prescribed today with your pharmacist as they can have adverse effects and interactions with your other medicines including over the counter and prescribed medications. Seek medical evaluation if you start to experience new or abnormal symptoms after taking one of these medicines, seek care immediately if you start to experience difficulty breathing, feeling of your throat closing, facial swelling, or rash as these could be indications of a more serious allergic reaction  Do not drive until you have seen your neurologist or primary care provider and they have cleared you to do so. Follow up within 3 days. Return to the ER for new or worsening symptoms or any other concerns.

## 2019-01-13 NOTE — ED Triage Notes (Addendum)
Pt BIBA from home w/ c/o witnessed seizure lasting 1-2 min.  Pt has hx of seizures.  Pt was in recliner and began to slip out of chair, seizure ended prior to pt landing on bottom on floor. EMS reports pt was having erratic behavior on scene, repeatedly trying to get up and down from floor, removing restraints on stretcher.    EMS gave Versed IM 5 mg en route.

## 2019-01-13 NOTE — ED Provider Notes (Signed)
[facility] DEPT Provider Note   CSN: [id number] Arrival date & time: 01/13/19  1205     History   Chief Complaint Chief Complaint  Patient presents with  . Seizures    HPI [name] is a 63 y.o. male with a hx of anxiety, depression, OCD, dyslipidemia, HTN, schizoaffective disorder, and seizure disorder who presents to the emergency department via EMS status post seizure activity which occurred shortly prior to arrival.  Patient states he was seated in his recliner when he believes he had a seizure.  States he currently feels back to baseline without complaints.  Per EMS to triage team patient had a witnessed seizure that lasted approximately 1 to 2 minutes, he did slide down the recliner a bit, but did not fall to the ground or have a head injury.  EMS reported that patient was agitated with erratic behavior on scene and therefore he was given a 5 mg of IM Versed in route.  He states he currently feels back to baseline.  He has no complaints.  States his last seizure was approximately 1 year ago.  He is prescribed Vimpat for his seizures and has been taking as prescribed.  He also is prescribed Klonopin, he gives me variable answers when I ask about the last time he took this medicine.  He initially told me yesterday morning, then told me 1 week ago as he was trying to stop taking it. Patient is an overall poor historian.  Denies fever, chills, headache, neck pain, back pain, chest pain, dyspnea, numbness, weakness, or dizziness.   HPI  Past Medical History:  Diagnosis Date  . Anxiety   . Constipation   . Depression   . Dyslipidemia   . GERD (gastroesophageal reflux disease)   . Hearing loss   . History of echocardiogram    Echo 8/18: EF 50-55, normal wall motion, grade 1 diastolic dysfunction, mild MR  . Hypertension   . Memory difficulty 10/25/2017  . Memory loss   . MRSA carrier   . Schizoaffective disorder   . Seizures [facility])     Patient  Active Problem List   Diagnosis Date Noted  . Closed tibia fracture 11/25/2018  . Fracture of tibial shaft, left, closed 11/25/2018  . Unintentional weight loss 04/07/2018  . Syncope and collapse 12/13/2017  . Memory difficulty 10/25/2017  . Presbyacusia 07/26/2017  . Palpitations 07/01/2017  . Acute bronchiolitis due to human metapneumovirus   . Mild sleep apnea 06/15/2016  . Actinic keratosis 06/15/2016  . Nocturnal dyspnea 02/03/2016  . Skin lesion of face 02/03/2016  . Paresthesia of both hands 09/16/2015  . Phenytoin toxicity   . TIA (transient ischemic attack) 07/29/2015  . Vertigo 07/29/2015  . Schizoaffective disorder, depressive type with good prognostic features ([place]) 07/13/2015  . Verruca 05/18/2015  . Seborrheic dermatitis of scalp 05/18/2015  . MDD (major depressive disorder), recurrent severe, without psychosis ([place])   . OCD (obsessive compulsive disorder)   . Seizures ([place])   . Schizoaffective disorder, depressive type ([place])   . Orthostatic hypotension   . Fall   . Seizure ([place]) 11/18/2014  . Schizoaffective disorder, unspecified type ([place])   . Essential hypertension   . Overdose of beta-adrenergic antagonist drug 10/22/2014  . Overdose   . Nocturnal enuresis 03/23/2014  . Benzodiazepine dependence, continuous ([place]) 01/27/2014  . Severe major depression ([place]) 01/21/2014  . Frozen shoulder syndrome 09/20/2013  . Somnolence 09/20/2013  . Schizoaffective disorder ([place]) 09/20/2013  . Presbyopia  04/25/2012  . Androgen deficiency 09/07/2011  . Constipation, chronic 09/07/2011  . Other specified disorders of liver 12/30/2008  . Overweight(278.02) 09/08/2008  . Hyperlipidemia 01/28/2008  . Seizure disorder ([place]) 01/28/2008  . OSTEOPENIA 07/01/2007    Past Surgical History:  Procedure Laterality Date  . CARPAL TUNNEL RELEASE    . COLONOSCOPY  2010  . ORIF ANKLE FRACTURE Left 11/26/2018   Procedure: OPEN REDUCTION INTERNAL FIXATION (ORIF) ANKLE FRACTURE;   Surgeon: Hiram Gash, MD;  Location: [facility];  Service: Orthopedics;  Laterality: Left;  . ORIF SHOULDER FRACTURE    . self orchectomy    . SHOULDER CLOSED REDUCTION Left 09/16/2013   Procedure: CLOSED REDUCTION SHOULDER;  Surgeon: Mauri Pole, MD;  Location: [facility];  Service: Orthopedics;  Laterality: Left;  . SHOULDER HEMI-ARTHROPLASTY Left 09/18/2013   Procedure: LEFT SHOULDER HEMI-ARTHROPLASTY;  Surgeon: Augustin Schooling, MD;  Location: [facility];  Service: Orthopedics;  Laterality: Left;  . TIBIA IM NAIL INSERTION Left 11/26/2018   Procedure: INTRAMEDULLARY (IM) NAIL TIBIAL;  Surgeon: Hiram Gash, MD;  Location: [facility];  Service: Orthopedics;  Laterality: Left;        Home Medications    Prior to Admission medications   Medication Sig Start Date End Date Taking? Authorizing Provider  amitriptyline (ELAVIL) 100 MG tablet Take 2 tablets (200 mg total) by mouth at bedtime. 10/22/18   Arfeen, Arlyce Harman, MD  amLODipine (NORVASC) 5 MG tablet Take 1 tablet (5 mg total) by mouth daily. Patient taking differently: Take 5 mg by mouth at bedtime.  07/26/17   Mikell, Jeani Sow, MD  atorvastatin (LIPITOR) 40 MG tablet Take 1 tablet (40 mg total) by mouth daily. Patient taking differently: Take 40 mg by mouth at bedtime.  04/10/18   Leeanne Rio, MD  clonazePAM (KLONOPIN) 1 MG tablet Take 1 tablet (1 mg total) by mouth at bedtime. 10/22/18   Arfeen, Arlyce Harman, MD  hydrochlorothiazide (HYDRODIURIL) 25 MG tablet Take 1 tablet (25 mg total) by mouth daily. Patient taking differently: Take 25 mg by mouth at bedtime.  08/11/18   Alveda Reasons, MD  losartan (COZAAR) 25 MG tablet Take 2 tablets (50 mg total) by mouth daily. 12/24/18   Leeanne Rio, MD  metoprolol succinate (TOPROL-XL) 100 MG 24 hr tablet TAKE 2 TABLETS (200 MG TOTAL) DAILY BY MOUTH. Patient taking differently: Take 200 mg by mouth at bedtime.  10/16/18   Zenia Resides, MD  Multiple Vitamin (MULTIVITAMIN WITH MINERALS) TABS  tablet Take 1 tablet by mouth daily.    [provider]  phenytoin (DILANTIN) 100 MG ER capsule Take 1 capsule (100 mg total) by mouth at bedtime. 09/12/18   Kathrynn Ducking, MD  polyethylene glycol powder ([employer] PURELAX) powder TAKE 17 GRAMS 2 TIMES A DAY AS NEEDED FOR CONSTIPATION. MIX INTO 8 OF FLUID AND DRINK Patient taking differently: Take 17 g by mouth daily. Mix into 8 oz of fluid and drink 09/18/18   Dickie La, MD  QUEtiapine (SEROQUEL) 300 MG tablet Take 2 tablets (600 mg total) by mouth at bedtime. 10/22/18   Arfeen, Arlyce Harman, MD  testosterone cypionate (DEPOTESTOSTERONE CYPIONATE) 200 MG/ML injection Inject 0.75 mLs (150 mg total) into the muscle every 14 (fourteen) days. 09/12/16   Leeanne Rio, MD  VIMPAT 150 MG TABS TAKE 1 TABLET BY MOUTH TWICE A DAY 01/01/19   Ward Givens, NP    Family History Family History  Problem Relation Age of  Onset  . Stroke Father        Living at 90  . Stroke Mother        Stroke in late 53's. Died in her 20's  . Alcohol abuse Brother   . Alcohol abuse Brother   . Diabetes Brother   . Colon cancer Neg Hx   . Esophageal cancer Neg Hx   . Stomach cancer Neg Hx   . Rectal cancer Neg Hx     Social History Social History   Tobacco Use  . Smoking status: Never Smoker  . Smokeless tobacco: Never Used  Substance Use Topics  . Alcohol use: No    Alcohol/week: 0.0 standard drinks  . Drug use: No     Allergies   Codeine; Sulfa antibiotics; and Sulfasalazine   Review of Systems Review of Systems  Constitutional: Negative for chills and fever.  Respiratory: Negative for shortness of breath.   Cardiovascular: Negative for chest pain.  Musculoskeletal: Negative for back pain and neck pain.  Neurological: Positive for seizures. Negative for dizziness, weakness, numbness and headaches.  Psychiatric/Behavioral: Negative for confusion.  All other systems reviewed and are negative.    Physical Exam Updated Vital Signs BP  (!) 140/95 (BP Location: Left Arm)   Pulse 84   Temp 98 F (36.7 C) (Oral)   Resp (!) 21   SpO2 95%   Physical Exam Vitals signs and nursing note reviewed.  Constitutional:      General: He is not in acute distress.    Appearance: He is well-developed. He is not toxic-appearing.  HENT:     Head: Normocephalic and atraumatic.     Comments: No raccoon eyes or battle sign.    Ears:     Comments: No hemotympanum.    Nose: No rhinorrhea.  Eyes:     General:        Right eye: No discharge.        Left eye: No discharge.     Conjunctiva/sclera: Conjunctivae normal.  Neck:     Musculoskeletal: Normal range of motion and neck supple. No neck rigidity.     Comments: No spinous process tenderness or palpable step-off. Cardiovascular:     Rate and Rhythm: Normal rate and regular rhythm.  Pulmonary:     Effort: Pulmonary effort is normal. No respiratory distress.     Breath sounds: Normal breath sounds. No wheezing, rhonchi or rales.  Abdominal:     General: There is no distension.     Palpations: Abdomen is soft.     Tenderness: There is no abdominal tenderness.  Musculoskeletal:     Comments: No obvious deformity, open wounds, or ecchymosis Extremities: Moving at all joints without point/focal bony tenderness Back: No midline tenderness.  Skin:    General: Skin is warm and dry.     Findings: No rash.  Neurological:     Mental Status: He is alert.     Comments: Clear speech.  Patient is oriented to person and place, however seems disoriented to time.  He is able to tell me that the month is January, however states that it is 2018.  CN III through XII grossly intact.  Sensation grossly intact bilateral upper and lower extremities.  5 out of 5 symmetric grip strength.  5 out of 5 strength plantar dorsiflexion bilaterally.  Negative pronator drift.  Normal finger-to-nose.  Psychiatric:        Behavior: Behavior normal.    ED Treatments / Results  Labs (all labs  ordered are listed,  but only abnormal results are displayed) Labs Reviewed  COMPREHENSIVE METABOLIC PANEL - Abnormal; Notable for the following components:      Result Value   Glucose, Bld 109 (*)    Calcium 8.7 (*)    Total Bilirubin 1.6 (*)    All other components within normal limits  URINALYSIS, ROUTINE W REFLEX MICROSCOPIC - Abnormal; Notable for the following components:   APPearance HAZY (*)    Hgb urine dipstick SMALL (*)    Nitrite POSITIVE (*)    Leukocytes, UA LARGE (*)    WBC, UA >50 (*)    Bacteria, UA MANY (*)    All other components within normal limits  URINE CULTURE  CBC WITH DIFFERENTIAL/PLATELET  PHENYTOIN LEVEL, FREE AND TOTAL    EKG EKG Interpretation  Date/Time:  Tuesday January 13 2019 13:40:36 EST Ventricular Rate:  86 PR Interval:    QRS Duration: 120 QT Interval:  417 QTC Calculation: 499 R Axis:   -32 Text Interpretation:  Sinus rhythm Nonspecific intraventricular conduction delay Borderline T abnormalities, inferior leads No STEMI  Confirmed by Nanda Quinton [id number]) on 01/13/2019 1:42:10 PM Also confirmed by Nanda Quinton [phone]), editor Philomena Doheny [phone])  on 01/13/2019 1:48:04 PM   Radiology Ct Head Wo Contrast  Result Date: 01/13/2019 CLINICAL DATA:  Encephalopathy EXAM: CT HEAD WITHOUT CONTRAST TECHNIQUE: Contiguous axial images were obtained from the base of the skull through the vertex without intravenous contrast. Sagittal and coronal MPR images reconstructed from axial data set. COMPARISON:  07/29/2015 FINDINGS: Brain: Mild generalized atrophy. Normal ventricular morphology. No midline shift or mass effect. Otherwise normal appearance of brain parenchyma. No intracranial hemorrhage, mass lesion, or acute infarction. No extra-axial fluid collections. Vascular: Unremarkable Skull: Intact Sinuses/Orbits: Clear Other: N/A IMPRESSION: No acute intracranial abnormalities. No interval change. Electronically Signed   By: Lavonia Dana M.D.   On: 01/13/2019 16:42     Procedures Procedures (including critical care time)  Medications Ordered in ED Medications  cefTRIAXone (ROCEPHIN) 1 g in sodium chloride 0.9 % 100 mL IVPB (0 g Intravenous Stopped 01/13/19 1739)     Initial Impression / Assessment and Plan / ED Course  I have reviewed the triage vital signs and the nursing notes.  Pertinent labs & imaging results that were available during my care of the patient were reviewed by me and considered in my medical decision making (see chart for details).   Patient arrives to the ED via EMS status post seizure activity.  He does have known history of seizures, per chart review it appears he is taking Vimpat, Dilantin, and Klonopin.  It is unclear if patient is taking his Klonopin or Dilantin.  He is somewhat disoriented to time, and is an overall poor historian.  Unclear if this is post ictal, also could be affect of versed en route, given we are 1.5 hours out from ER arrival upon my assessment will obtain CT of the head as well as basic labs.  No focal neurologic deficits or obvious injuries on exam.  Work-up reviewed: CBC: Unremarkable.  No anemia.  No leukocytosis. CMP: Also fairly unremarkable.  Mildly hyperglycemic at 109.  Mildly hypocalcemic at 8.7.  Total bili minimally elevated at 1.6. Urinalysis: Consistent with UTI, possible cause of patient's seizure-renal function preserved, afebrile, no vomiting, not septic appearing, does not appear to require hospitalization for treatment at this time.  Rocephin given in the emergency department. CT head: Negative   Patient significant other is now at  bedside, she clarifies that he has not been taking his Dilantin his Klonopin for the past 3 weeks.  He discontinued these medications on his own without consultation to a medical provider.  He is taking his Vimpat as prescribed.  Per significant other he is at his mental status baseline and she has noted malodorous urine recently.  Given patient has not been  taking his Klonopin or Dilantin for 3 weeks now I do not feel like this is a withdrawal from benzodiazepine type seizure.  Dilantin level was ordered, however given he is not taking this medicine it is irrelevant at this time.  We will discharge him home with treatment for UTI with Keflex, as this may have caused his seizure.  He will need to follow-up closely with primary care as well as neurology regarding management of his seizures as well as to discuss clearance for driving. I discussed results, treatment plan, need for follow-up, and return precautions with the patient & his significant other. Provided opportunity for questions, patient & his significant other confirmed understanding and are in agreement with plan.   Findings and plan of care discussed with supervising physician Dr. Laverta Baltimore who is in agreement.   Final Clinical Impressions(s) / ED Diagnoses   Final diagnoses:  Seizure ([place])  Acute cystitis with hematuria    ED Discharge Orders         Ordered    cephALEXin (KEFLEX) 500 MG capsule  4 times daily     01/13/19 [address] PA-C 01/13/19 1805    Margette Fast, MD 01/13/19 Vernelle Emerald

## 2019-01-14 ENCOUNTER — Ambulatory Visit: Payer: Medicaid Other | Attending: Orthopaedic Surgery | Admitting: Physical Therapy

## 2019-01-15 ENCOUNTER — Ambulatory Visit: Payer: Medicaid Other | Admitting: Physical Therapy

## 2019-01-15 LAB — PHENYTOIN LEVEL, FREE AND TOTAL
Phenytoin, Free: NOT DETECTED ug/mL (ref 1.0–2.0)
Phenytoin, Total: 0.9 ug/mL — ABNORMAL LOW (ref 10.0–20.0)

## 2019-01-16 ENCOUNTER — Telehealth: Payer: Self-pay | Admitting: Family Medicine

## 2019-01-16 NOTE — Telephone Encounter (Signed)
Received faxed orders for patient's home health to sign. Noted that it requires me to sign saying I have done a face to face encounter with patient. Patient has not scheduled an appointment and has not been seen in our office since September. I called [facility] at [phone] to let them know that patient needs an appointment before I can legally sign these orders. They will have him schedule with me.  Leeanne Rio, MD

## 2019-01-20 ENCOUNTER — Encounter: Payer: Self-pay | Admitting: Psychology

## 2019-01-20 ENCOUNTER — Encounter

## 2019-01-21 LAB — SUSCEPTIBILITY RESULT

## 2019-01-21 LAB — SUSCEPTIBILITY, AER + ANAEROB

## 2019-01-22 ENCOUNTER — Ambulatory Visit (INDEPENDENT_AMBULATORY_CARE_PROVIDER_SITE_OTHER): Payer: Medicaid Other | Admitting: Psychiatry

## 2019-01-22 ENCOUNTER — Other Ambulatory Visit: Payer: Self-pay | Admitting: Family Medicine

## 2019-01-22 DIAGNOSIS — F250 Schizoaffective disorder, bipolar type: Secondary | ICD-10-CM | POA: Diagnosis not present

## 2019-01-22 DIAGNOSIS — F411 Generalized anxiety disorder: Secondary | ICD-10-CM

## 2019-01-22 MED ORDER — LORAZEPAM 0.5 MG PO TABS: ORAL_TABLET | ORAL | 1 refills | Status: DC

## 2019-01-22 MED ORDER — QUETIAPINE FUMARATE 300 MG PO TABS: 600.0000 mg | ORAL_TABLET | Freq: Every day | ORAL | 2 refills | Status: DC

## 2019-01-22 MED ORDER — LAMOTRIGINE 25 MG PO TABS: ORAL_TABLET | ORAL | 1 refills | Status: DC

## 2019-01-22 MED ORDER — AMITRIPTYLINE HCL 100 MG PO TABS: 200.0000 mg | ORAL_TABLET | Freq: Every day | ORAL | 2 refills | Status: DC

## 2019-01-22 NOTE — Progress Notes (Signed)
[facility] MD/PA/NP OP Progress Note  01/22/2019 3:58 PM [name]  MRN:  [id number]  Chief Complaint: I am not doing good.  I have been hospitalized few times.  I had a seizure and I broke my leg.  HPI: [name] came for his appointment.  He is anxious and nervous.  He admitted in the hospital few times for multiple reason.  He had a fracture tibia and then he had a seizure.  He is not sure if he had to stop the Klonopin that cause seizure.  He has a history of seizure disorder.  On the last visit he was not happy with the Klonopin because it was making him very groggy.  However I have recommended him to take at least 1 mg.  In the hospital his seizure medicines were changed.  Now is taking Vimpat.  He is not sure if he is taking Dilantin.  In the hospital his Dilantin level was 0.9.  He also noticed stuttering and is very concerned about it.  He is sleeping on and off.  He has difficulty walking because of pain in his left leg.  He is paranoid about his living situation.  He let stay a woman who was homeless but also very helpful but he noticed there a lot of stranger comes in the house.  He told his dishwasher, washing machine and TV is not working.  He cannot let her go this woman because she helps him since he broke his leg and requires a lot of assistance.  He has a chronic paranoia and sometimes he does not trust people.  He denies any suicidal thoughts or homicidal thought.  Though he has chronic depression and most of the time he stays to himself.  He appears tired and does not do exercise.  He does not leave the house unless it is important.   Visit Diagnosis:    ICD-10-CM   1. Schizoaffective disorder, bipolar type ([place]) F25.0 QUEtiapine (SEROQUEL) 300 MG tablet    lamoTRIgine (LAMICTAL) 25 MG tablet  2. GAD (generalized anxiety disorder) F41.1 amitriptyline (ELAVIL) 100 MG tablet    lamoTRIgine (LAMICTAL) 25 MG tablet    LORazepam (ATIVAN) 0.5 MG tablet    Past Psychiatric History: Reviewed. H/O  depression since college. H/O at least 15 hospitalization for depression, paranoia and suicidal thoughts. Last hospitalization in August 2016. At that time he was thinking to kill himself by inhaling helium. Discharged on Seroquel 900 mg and amitriptyline 400 mgat bedtime.H/O suicidal attempt to cut his testicle as does not want remain depressed. Seen at [name]. Tried multiple trial of SSRIs but reaction to SSRIs. Tried nortriptyline, Haldol, Geodon, Seroquel, lithium, Prozac, Wellbutrin, Paxil, Klonopin, Ativan, Lexapro and Zoloft. No H/O mania or any drug use.  Past Medical History:  Past Medical History:  Diagnosis Date  . Anxiety   . Constipation   . Depression   . Dyslipidemia   . GERD (gastroesophageal reflux disease)   . Hearing loss   . History of echocardiogram    Echo 8/18: EF 50-55, normal wall motion, grade 1 diastolic dysfunction, mild MR  . Hypertension   . Memory difficulty 10/25/2017  . Memory loss   . MRSA carrier   . Schizoaffective disorder   . Seizures ([place])     Past Surgical History:  Procedure Laterality Date  . CARPAL TUNNEL RELEASE    . COLONOSCOPY  2010  . ORIF ANKLE FRACTURE Left 11/26/2018   Procedure: OPEN  REDUCTION INTERNAL FIXATION (ORIF) ANKLE FRACTURE;  Surgeon: Hiram Gash, MD;  Location: [facility];  Service: Orthopedics;  Laterality: Left;  . ORIF SHOULDER FRACTURE    . self orchectomy    . SHOULDER CLOSED REDUCTION Left 09/16/2013   Procedure: CLOSED REDUCTION SHOULDER;  Surgeon: Mauri Pole, MD;  Location: [facility];  Service: Orthopedics;  Laterality: Left;  . SHOULDER HEMI-ARTHROPLASTY Left 09/18/2013   Procedure: LEFT SHOULDER HEMI-ARTHROPLASTY;  Surgeon: Augustin Schooling, MD;  Location: [facility];  Service: Orthopedics;  Laterality: Left;  . TIBIA IM NAIL INSERTION Left 11/26/2018   Procedure: INTRAMEDULLARY (IM) NAIL TIBIAL;  Surgeon: Hiram Gash, MD;  Location: [facility];  Service: Orthopedics;   Laterality: Left;    Family Psychiatric History: Reviewed.  Family History:  Family History  Problem Relation Age of Onset  . Stroke Father        Living at 39  . Stroke Mother        Stroke in late 91's. Died in her 31's  . Alcohol abuse Brother   . Alcohol abuse Brother   . Diabetes Brother   . Colon cancer Neg Hx   . Esophageal cancer Neg Hx   . Stomach cancer Neg Hx   . Rectal cancer Neg Hx     Social History:  Social History   Socioeconomic History  . Marital status: Single    Spouse name: Not on file  . Number of children: 0  . Years of education: 25  . Highest education level: Not on file  Occupational History  . Occupation: disabled  Social Needs  . Financial resource strain: Not hard at all  . Food insecurity:    Worry: Never true    Inability: Never true  . Transportation needs:    Medical: No    Non-medical: No  Tobacco Use  . Smoking status: Never Smoker  . Smokeless tobacco: Never Used  Substance and Sexual Activity  . Alcohol use: No    Alcohol/week: 0.0 standard drinks  . Drug use: No  . Sexual activity: Not Currently    Partners: Female    Birth control/protection: None  Lifestyle  . Physical activity:    Days per week: 0 days    Minutes per session: 0 min  . Stress: Only a little  Relationships  . Social connections:    Talks on phone: More than three times a week    Gets together: More than three times a week    Attends religious service: Never    Active member of club or organization: No    Attends meetings of clubs or organizations: Never    Relationship status: Not on file  Other Topics Concern  . Not on file  Social History Narrative   Patient does not drink caffeine.   Patient is left handed.    Allergies:  Allergies  Allergen Reactions  . Codeine Nausea And Vomiting  . Sulfa Antibiotics Nausea And Vomiting  . Sulfasalazine Nausea And Vomiting    Metabolic Disorder Labs: Recent Results (from the past 2160 hour(s))   Basic metabolic panel     Status: Abnormal   Collection Time: 11/25/18  1:39 PM  Result Value Ref Range   Sodium 137 135 - 145 mmol/L   Potassium 4.5 3.5 - 5.1 mmol/L   Chloride 105 98 - 111 mmol/L   CO2 23 22 - 32 mmol/L   Glucose, Bld 107 (H) 70 - 99 mg/dL   BUN 28 (H) 8 -  23 mg/dL   Creatinine, Ser 0.97 0.61 - 1.24 mg/dL   Calcium 8.3 (L) 8.9 - 10.3 mg/dL   GFR calc non Af Amer >60 >60 mL/min   GFR calc Af Amer >60 >60 mL/min   Anion gap 9 5 - 15    Comment: Performed at [facility], [place] [address], [place], [place] [address]  CBC with Differential     Status: Abnormal   Collection Time: 11/25/18  1:39 PM  Result Value Ref Range   WBC 14.8 (H) 4.0 - 10.5 K/uL   RBC 4.88 4.22 - 5.81 MIL/uL   Hemoglobin 15.7 13.0 - 17.0 g/dL   HCT 46.8 39.0 - 52.0 %   MCV 95.9 80.0 - 100.0 fL   MCH 32.2 26.0 - 34.0 pg   MCHC 33.5 30.0 - 36.0 g/dL   RDW 13.6 11.5 - 15.5 %   Platelets 184 150 - 400 K/uL   nRBC 0.0 0.0 - 0.2 %   Neutrophils Relative % 79 %   Neutro Abs 11.8 (H) 1.7 - 7.7 K/uL   Lymphocytes Relative 12 %   Lymphs Abs 1.8 0.7 - 4.0 K/uL   Monocytes Relative 6 %   Monocytes Absolute 0.9 0.1 - 1.0 K/uL   Eosinophils Relative 2 %   Eosinophils Absolute 0.3 0.0 - 0.5 K/uL   Basophils Relative 0 %   Basophils Absolute 0.1 0.0 - 0.1 K/uL   Immature Granulocytes 1 %   Abs Immature Granulocytes 0.08 (H) 0.00 - 0.07 K/uL    Comment: Performed at [facility], [place] [address] [place], [place] [address]  Phenytoin level, total     Status: Abnormal   Collection Time: 11/25/18  3:47 PM  Result Value Ref Range   Phenytoin Lvl <2.5 (L) 10.0 - 20.0 ug/mL    Comment: REPEATED TO VERIFY Performed at [facility] [place] [address] [place], [place] [address]   Surgical PCR screen     Status: None   Collection Time: 11/25/18  6:13 PM  Result Value Ref Range   MRSA, PCR NEGATIVE NEGATIVE   Staphylococcus aureus NEGATIVE NEGATIVE     Comment: (NOTE) The Xpert SA Assay (FDA approved for NASAL specimens in patients 46 years of age and older), is one component of a comprehensive surveillance program. It is not intended to diagnose infection nor to guide or monitor treatment. Performed at [facility], [place] [address], [place], [place] [address]   Basic metabolic panel     Status: Abnormal   Collection Time: 11/26/18  2:53 AM  Result Value Ref Range   Sodium 135 135 - 145 mmol/L   Potassium 4.6 3.5 - 5.1 mmol/L    Comment: SLIGHT HEMOLYSIS   Chloride 106 98 - 111 mmol/L   CO2 19 (L) 22 - 32 mmol/L   Glucose, Bld 88 70 - 99 mg/dL   BUN 19 8 - 23 mg/dL   Creatinine, Ser 0.90 0.61 - 1.24 mg/dL   Calcium 7.8 (L) 8.9 - 10.3 mg/dL   GFR calc non Af Amer >60 >60 mL/min   GFR calc Af Amer >60 >60 mL/min   Anion gap 10 5 - 15    Comment: Performed at [facility], [place] [address] [place] [address]  CBC     Status: None   Collection Time: 11/26/18  3:35 AM  Result Value Ref Range   WBC 9.2 4.0 - 10.5 K/uL   RBC 4.69  4.22 - 5.81 MIL/uL   Hemoglobin 14.7 13.0 - 17.0 g/dL   HCT 44.3 39.0 - 52.0 %   MCV 94.5 80.0 - 100.0 fL   MCH 31.3 26.0 - 34.0 pg   MCHC 33.2 30.0 - 36.0 g/dL   RDW 13.5 11.5 - 15.5 %   Platelets 171 150 - 400 K/uL   nRBC 0.0 0.0 - 0.2 %    Comment: Performed at [facility], [place] [address] [place], [place] [address]  CBC     Status: Abnormal   Collection Time: 11/27/18  1:48 AM  Result Value Ref Range   WBC 14.5 (H) 4.0 - 10.5 K/uL   RBC 4.39 4.22 - 5.81 MIL/uL   Hemoglobin 13.8 13.0 - 17.0 g/dL   HCT 40.8 39.0 - 52.0 %   MCV 92.9 80.0 - 100.0 fL   MCH 31.4 26.0 - 34.0 pg   MCHC 33.8 30.0 - 36.0 g/dL   RDW 13.0 11.5 - 15.5 %   Platelets 195 150 - 400 K/uL   nRBC 0.0 0.0 - 0.2 %    Comment: Performed at [facility], [place] [address], [place], [place] [address]  Basic metabolic panel     Status: Abnormal   Collection Time: 11/27/18  1:48 AM  Result Value Ref  Range   Sodium 136 135 - 145 mmol/L   Potassium 4.1 3.5 - 5.1 mmol/L   Chloride 101 98 - 111 mmol/L   CO2 24 22 - 32 mmol/L   Glucose, Bld 147 (H) 70 - 99 mg/dL   BUN 14 8 - 23 mg/dL   Creatinine, Ser 0.96 0.61 - 1.24 mg/dL   Calcium 8.7 (L) 8.9 - 10.3 mg/dL   GFR calc non Af Amer >60 >60 mL/min   GFR calc Af Amer >60 >60 mL/min   Anion gap 11 5 - 15    Comment: Performed at [facility], [place] [address], [place], [place] [address]  CBC     Status: Abnormal   Collection Time: 11/28/18  1:57 AM  Result Value Ref Range   WBC 8.7 4.0 - 10.5 K/uL   RBC 3.88 (L) 4.22 - 5.81 MIL/uL   Hemoglobin 12.3 (L) 13.0 - 17.0 g/dL   HCT 36.6 (L) 39.0 - 52.0 %   MCV 94.3 80.0 - 100.0 fL   MCH 31.7 26.0 - 34.0 pg   MCHC 33.6 30.0 - 36.0 g/dL   RDW 13.5 11.5 - 15.5 %   Platelets 156 150 - 400 K/uL   nRBC 0.0 0.0 - 0.2 %    Comment: Performed at [facility], [place] [address] [place], [place] [address]  Basic metabolic panel     Status: Abnormal   Collection Time: 11/28/18  1:57 AM  Result Value Ref Range   Sodium 139 135 - 145 mmol/L   Potassium 4.2 3.5 - 5.1 mmol/L   Chloride 106 98 - 111 mmol/L   CO2 23 22 - 32 mmol/L   Glucose, Bld 120 (H) 70 - 99 mg/dL   BUN 18 8 - 23 mg/dL   Creatinine, Ser 0.95 0.61 - 1.24 mg/dL   Calcium 8.5 (L) 8.9 - 10.3 mg/dL   GFR calc non Af Amer >60 >60 mL/min   GFR calc Af Amer >60 >60 mL/min   Anion gap 10 5 - 15    Comment: Performed at [facility] [place] [address] [place],  [address]  CBC  Status: Abnormal   Collection Time: 11/29/18  3:20 AM  Result Value Ref Range   WBC 8.4 4.0 - 10.5 K/uL   RBC 4.08 (L) 4.22 - 5.81 MIL/uL   Hemoglobin 13.1 13.0 - 17.0 g/dL   HCT 39.1 39.0 - 52.0 %   MCV 95.8 80.0 - 100.0 fL   MCH 32.1 26.0 - 34.0 pg   MCHC 33.5 30.0 - 36.0 g/dL   RDW 13.6 11.5 - 15.5 %   Platelets 166 150 - 400 K/uL   nRBC 0.0 0.0 - 0.2 %    Comment: Performed at [facility], [place] [address] [place], [place]  [address]  Urinalysis, Routine w reflex microscopic     Status: Abnormal   Collection Time: 01/13/19  1:44 PM  Result Value Ref Range   Color, Urine YELLOW YELLOW   APPearance HAZY (A) CLEAR   Specific Gravity, Urine 1.010 1.005 - 1.030   pH 7.0 5.0 - 8.0   Glucose, UA NEGATIVE NEGATIVE mg/dL   Hgb urine dipstick SMALL (A) NEGATIVE   Bilirubin Urine NEGATIVE NEGATIVE   Ketones, ur NEGATIVE NEGATIVE mg/dL   Protein, ur NEGATIVE NEGATIVE mg/dL   Nitrite POSITIVE (A) NEGATIVE   Leukocytes, UA LARGE (A) NEGATIVE   RBC / HPF 0-5 0 - 5 RBC/hpf   WBC, UA >50 (H) 0 - 5 WBC/hpf   Bacteria, UA MANY (A) NONE SEEN   Squamous Epithelial / LPF 0-5 0 - 5   WBC Clumps PRESENT    Mucus PRESENT     Comment: Performed at [facility], [place] [address], [place], [place] [address]  CBC with Differential     Status: None   Collection Time: 01/13/19  1:49 PM  Result Value Ref Range   WBC 7.9 4.0 - 10.5 K/uL   RBC 4.59 4.22 - 5.81 MIL/uL   Hemoglobin 15.0 13.0 - 17.0 g/dL   HCT 44.6 39.0 - 52.0 %   MCV 97.2 80.0 - 100.0 fL   MCH 32.7 26.0 - 34.0 pg   MCHC 33.6 30.0 - 36.0 g/dL   RDW 14.6 11.5 - 15.5 %   Platelets 188 150 - 400 K/uL   nRBC 0.0 0.0 - 0.2 %   Neutrophils Relative % 83 %   Neutro Abs 6.5 1.7 - 7.7 K/uL   Lymphocytes Relative 11 %   Lymphs Abs 0.8 0.7 - 4.0 K/uL   Monocytes Relative 5 %   Monocytes Absolute 0.4 0.1 - 1.0 K/uL   Eosinophils Relative 0 %   Eosinophils Absolute 0.0 0.0 - 0.5 K/uL   Basophils Relative 0 %   Basophils Absolute 0.0 0.0 - 0.1 K/uL   Immature Granulocytes 1 %   Abs Immature Granulocytes 0.05 0.00 - 0.07 K/uL    Comment: Performed at [facility], [place] [address] [place], [place] [address]  Comprehensive metabolic panel     Status: Abnormal   Collection Time: 01/13/19  1:49 PM  Result Value Ref Range   Sodium 135 135 - 145 mmol/L   Potassium 4.6 3.5 - 5.1 mmol/L   Chloride 104 98 - 111 mmol/L   CO2 24 22 - 32 mmol/L    Glucose, Bld 109 (H) 70 - 99 mg/dL   BUN 15 8 - 23 mg/dL   Creatinine, Ser 1.14 0.61 - 1.24 mg/dL   Calcium 8.7 (L) 8.9 - 10.3 mg/dL   Total Protein 7.1 6.5 - 8.1 g/dL   Albumin 4.2 3.5 -  5.0 g/dL   AST 35 15 - 41 U/L   ALT 15 0 - 44 U/L   Alkaline Phosphatase 122 38 - 126 U/L   Total Bilirubin 1.6 (H) 0.3 - 1.2 mg/dL   GFR calc non Af Amer >60 >60 mL/min   GFR calc Af Amer >60 >60 mL/min   Anion gap 7 5 - 15    Comment: Performed at [facility], [place] [address], [place], [place] [address]  Phenytoin level, free and total     Status: Abnormal   Collection Time: 01/13/19  1:49 PM  Result Value Ref Range   Phenytoin, Free None Detected 1.0 - 2.0 ug/mL    Comment: (NOTE)                                Detection Limit = 0.5 Performed At: [facility] [place], [place] [id number] Rush Farmer MD [id number]    Phenytoin, Total 0.9 (L) 10.0 - 20.0 ug/mL    Comment: (NOTE)                                Detection Limit =  0.8                          <0.8 Indicates None Detected   Urine culture     Status: Abnormal (Preliminary result)   Collection Time: 01/13/19  2:19 PM  Result Value Ref Range   Specimen Description      URINE, RANDOM Performed at [facility], [place] [address] [place], [place] [address]    Special Requests      NONE Performed at [facility], [place] [address], [place], [place] [address]    Culture (A)     >=100,000 COLONIES/mL ESCHERICHIA COLI Sent to [place] for further susceptibility testing. Performed at [facility], [place] [address] [place], [place] [address]    Report Status PENDING   Susceptibility, Aer + Anaerob     Status: None   Collection Time: 01/13/19  2:19 PM  Result Value Ref Range   Suscept, Aer + Anaerob Final report     Comment: (NOTE) Performed At: [facility] [address] [place], [place] [id number] Rush Farmer MD  SN:[id number] CORRECTED ON 02/05 AT 0135: PREVIOUSLY REPORTED AS Preliminary report    Source of Sample URINE, RANDOM     Comment: ORGANISM ID IS E COLI Performed at [facility], [place] [address] [place], [place] [address]   Susceptibility Result     Status: None   Collection Time: 01/13/19  2:19 PM  Result Value Ref Range   Suscept Result 1 Escherichia coli     Comment: (NOTE) Identification performed by account, not confirmed by this laboratory. Cefazolin <=4 ug/mL Cefazolin with an MIC <=16 predicts susceptibility to the oral agents cefaclor, cefdinir, cefpodoxime, cefprozil, cefuroxime, cephalexin, and loracarbef when used for therapy of uncomplicated urinary tract infections due to E. coli, Klebsiella pneumoniae, and Proteus mirabilis.    Antimicrobial Suscept Comment     Comment: (NOTE)      ** S = Susceptible; I = Intermediate; R = Resistant **                   P = Positive; N = Negative  MICS are expressed in micrograms per mL   Antibiotic                 RSLT#1    RSLT#2    RSLT#3    RSLT#4 Amoxicillin/Clavulanic Acid    S Ampicillin                     S Cefepime                       S Ceftriaxone                    S Cefuroxime                     S Ciprofloxacin                  S Ertapenem                      S Gentamicin                     S Imipenem                       S Levofloxacin                   S Meropenem                      S Nitrofurantoin                 R Piperacillin/Tazobactam        S Tetracycline                   R Tobramycin                     S Trimethoprim/Sulfa             S Performed At: [facility] [place], [place] [id number] Rush Farmer MD [id number]    Lab Results  Component Value Date   HGBA1C 6.0 04/01/2018   MPG 111 07/30/2015   MPG 123 07/13/2015   Lab Results  Component Value Date   PROLACTIN 12.0 07/13/2015   Lab Results  Component Value Date   CHOL 141  04/01/2018   TRIG 116 04/01/2018   HDL 55 04/01/2018   CHOLHDL 2.6 04/01/2018   VLDL 28 02/18/2017   LDLCALC 63 04/01/2018   LDLCALC 80 02/18/2017   Lab Results  Component Value Date   TSH 2.100 04/01/2018   TSH 2.644 07/13/2015    Therapeutic Level Labs: No results found for: LITHIUM No results found for: VALPROATE No components found for:  CBMZ  Current Medications: Current Outpatient Medications  Medication Sig Dispense Refill  . acetaminophen (TYLENOL) 500 MG tablet Take 1,000 mg by mouth every 6 (six) hours as needed for mild pain or moderate pain.    Marland Kitchen amitriptyline (ELAVIL) 100 MG tablet Take 2 tablets (200 mg total) by mouth at bedtime. 60 tablet 2  . atorvastatin (LIPITOR) 40 MG tablet Take 1 tablet (40 mg total) by mouth daily. (Patient taking differently: Take 40 mg by mouth at bedtime. ) 90 tablet 3  . cephALEXin (KEFLEX) 500 MG capsule Take 1 capsule (500 mg total) by mouth 4 (four) times daily. 28 capsule 0  . hydrochlorothiazide (HYDRODIURIL) 25 MG tablet  Take 1 tablet (25 mg total) by mouth daily. (Patient taking differently: Take 25 mg by mouth at bedtime. ) 90 tablet 3  . losartan (COZAAR) 25 MG tablet Take 2 tablets (50 mg total) by mouth daily. 60 tablet 0  . metoprolol succinate (TOPROL-XL) 100 MG 24 hr tablet TAKE 2 TABLETS (200 MG TOTAL) DAILY BY MOUTH. (Patient taking differently: Take 200 mg by mouth at bedtime. ) 180 tablet 3  . Multiple Vitamin (MULTIVITAMIN WITH MINERALS) TABS tablet Take 1 tablet by mouth daily.    . phenytoin (DILANTIN) 100 MG ER capsule Take 1 capsule (100 mg total) by mouth at bedtime. 90 capsule 3  . polyethylene glycol powder ([employer] PURELAX) powder TAKE 17 GRAMS 2 TIMES A DAY AS NEEDED FOR CONSTIPATION. MIX INTO 8 OF FLUID AND DRINK (Patient taking differently: Take 17 g by mouth daily. Mix into 8 oz of fluid and drink) 510 g 1  . QUEtiapine (SEROQUEL) 300 MG tablet Take 2 tablets (600 mg total) by mouth at bedtime. 60 tablet 2  .  testosterone cypionate (DEPOTESTOSTERONE CYPIONATE) 200 MG/ML injection Inject 0.75 mLs (150 mg total) into the muscle every 14 (fourteen) days.    Marland Kitchen VIMPAT 150 MG TABS TAKE 1 TABLET BY MOUTH TWICE A DAY (Patient taking differently: Take 150 mg by mouth 2 (two) times daily. ) 60 tablet 5   No current facility-administered medications for this visit.      Musculoskeletal: Strength & Muscle Tone: within normal limits Gait & Station: Difficulty walking due to pain in left leg. Patient leans: N/A  Psychiatric Specialty Exam: Review of Systems  Constitutional: Positive for malaise/fatigue.  Skin: Negative for itching and rash.  Neurological: Positive for seizures.  Psychiatric/Behavioral: The patient is nervous/anxious.     Blood pressure 122/74, height 5\' 11"  (1.803 m), weight 200 lb (90.7 kg).There is no height or weight on file to calculate BMI.  General Appearance: Disheveled and Superficially cooperative.  Eye Contact:  Fair  Speech:  Slow  Volume:  Decreased  Mood:  Anxious and Dysphoric  Affect:  Congruent  Thought Process:  Descriptions of Associations: Intact  Orientation:  Full (Time, Place, and Person)  Thought Content: Paranoid Ideation and Rumination   Suicidal Thoughts:  No  Homicidal Thoughts:  No  Memory:  Immediate;   Fair Recent;   Fair Remote;   Fair  Judgement:  Fair  Insight:  Fair  Psychomotor Activity:  Decreased  Concentration:  Concentration: Fair and Attention Span: Fair  Recall:  [employer] of Knowledge: Fair  Language: Good  Akathisia:  No  Handed:  Right  AIMS (if indicated): not done  Assets:  Desire for Improvement Housing  ADL's:  Intact  Cognition: WNL  Sleep:  Fair   Screenings: AIMS     Admission (Discharged) from 07/13/2015 in [facility]  AIMS Total Score  0    AUDIT     Admission (Discharged) from 07/13/2015 in [facility] Admission (Discharged) from 05/08/2014 in  [facility] Admission (Discharged) from 01/21/2014 in [facility]  Alcohol Use Disorder Identification Test Final Score (AUDIT)  0  0  0    Mini-Mental     Office Visit from 05/15/2018 in [facility] Office Visit from 10/25/2017 in [facility]  Total Score (max 30 points )  27  25    PHQ2-9     Office Visit from  08/19/2018 in [facility] ED to Hosp-Admission (Discharged) from 08/04/2018 in [facility] [place] Office Visit from 07/11/2018 in [facility] Office Visit from 07/26/2017 in [facility] Office Visit from 05/02/2017 in [facility]  PHQ-2 Total Score  0  0  0  6  0       Assessment and Plan: Schizoaffective disorder, bipolar type.  Generalized anxiety disorder.  I review records from the emergency room and discharge summary with current medication and blood work results.  Discussed noncompliance with medication can cause relapse into illness.  He still does not want to take Klonopin because he feels it puts him in apply cold.  He like to go back on Ativan which he has taken in the past.  I recommended he should see neurology as recently he had a seizure.  He is now Vimpat.  I recommended to try Lamictal which can help his mood anxiety and also seizures.  He agreed with the plan.  We discussed medication side effects that if he ever had any rash with the Lamictal then he need to stop the medication immediately.  I will discontinue Klonopin and start Ativan 0.5 mg needed for anxiety.  Patient is not interested in therapy.  Continue Seroquel 600 mg at bedtime and amitriptyline 200 mg at bedtime.  Discussed safety concerns at any time having active suicidal thoughts or homicidal thought that he need to call 911 of the local emergency room.  Follow-up in 4 weeks.  Time spent 30 minutes.   More than 50% of the time spent in psychoeducation, counseling, coronation of care, reviewing medical records blood work results and long-term prognosis.   Kathlee Nations, MD 01/22/2019, 3:58 PM

## 2019-01-24 ENCOUNTER — Inpatient Hospital Stay (HOSPITAL_COMMUNITY)
Admission: EM | Admit: 2019-01-24 | Discharge: 2019-01-25 | DRG: 917 | Disposition: A | Discharge status: Home / Self Care | Payer: Medicaid Other | Attending: Internal Medicine | Admitting: Internal Medicine

## 2019-01-24 ENCOUNTER — Emergency Department (HOSPITAL_COMMUNITY): Payer: Medicaid Other

## 2019-01-24 ENCOUNTER — Encounter (HOSPITAL_COMMUNITY): Payer: Self-pay | Admitting: Emergency Medicine

## 2019-01-24 DIAGNOSIS — F1320 Sedative, hypnotic or anxiolytic dependence, uncomplicated: Secondary | ICD-10-CM | POA: Diagnosis present

## 2019-01-24 DIAGNOSIS — Z22322 Carrier or suspected carrier of Methicillin resistant Staphylococcus aureus: Secondary | ICD-10-CM | POA: Diagnosis not present

## 2019-01-24 DIAGNOSIS — T424X1A Poisoning by benzodiazepines, accidental (unintentional), initial encounter: Principal | ICD-10-CM | POA: Diagnosis present

## 2019-01-24 DIAGNOSIS — Z823 Family history of stroke: Secondary | ICD-10-CM

## 2019-01-24 DIAGNOSIS — Z885 Allergy status to narcotic agent status: Secondary | ICD-10-CM

## 2019-01-24 DIAGNOSIS — Z9114 Patient's other noncompliance with medication regimen: Secondary | ICD-10-CM | POA: Diagnosis not present

## 2019-01-24 DIAGNOSIS — Z79899 Other long term (current) drug therapy: Secondary | ICD-10-CM

## 2019-01-24 DIAGNOSIS — F259 Schizoaffective disorder, unspecified: Secondary | ICD-10-CM | POA: Diagnosis not present

## 2019-01-24 DIAGNOSIS — F329 Major depressive disorder, single episode, unspecified: Secondary | ICD-10-CM | POA: Diagnosis present

## 2019-01-24 DIAGNOSIS — E785 Hyperlipidemia, unspecified: Secondary | ICD-10-CM | POA: Diagnosis present

## 2019-01-24 DIAGNOSIS — F419 Anxiety disorder, unspecified: Secondary | ICD-10-CM | POA: Diagnosis present

## 2019-01-24 DIAGNOSIS — H9190 Unspecified hearing loss, unspecified ear: Secondary | ICD-10-CM | POA: Diagnosis present

## 2019-01-24 DIAGNOSIS — T50905A Adverse effect of unspecified drugs, medicaments and biological substances, initial encounter: Secondary | ICD-10-CM | POA: Diagnosis present

## 2019-01-24 DIAGNOSIS — G9341 Metabolic encephalopathy: Secondary | ICD-10-CM | POA: Diagnosis present

## 2019-01-24 DIAGNOSIS — I10 Essential (primary) hypertension: Secondary | ICD-10-CM | POA: Diagnosis present

## 2019-01-24 DIAGNOSIS — Z833 Family history of diabetes mellitus: Secondary | ICD-10-CM | POA: Diagnosis not present

## 2019-01-24 DIAGNOSIS — G40909 Epilepsy, unspecified, not intractable, without status epilepticus: Secondary | ICD-10-CM

## 2019-01-24 DIAGNOSIS — G9340 Encephalopathy, unspecified: Secondary | ICD-10-CM | POA: Diagnosis present

## 2019-01-24 DIAGNOSIS — Z96612 Presence of left artificial shoulder joint: Secondary | ICD-10-CM | POA: Diagnosis present

## 2019-01-24 DIAGNOSIS — Z882 Allergy status to sulfonamides status: Secondary | ICD-10-CM | POA: Diagnosis not present

## 2019-01-24 DIAGNOSIS — K219 Gastro-esophageal reflux disease without esophagitis: Secondary | ICD-10-CM | POA: Diagnosis present

## 2019-01-24 DIAGNOSIS — F200 Paranoid schizophrenia: Secondary | ICD-10-CM | POA: Diagnosis present

## 2019-01-24 DIAGNOSIS — G92 Toxic encephalopathy: Secondary | ICD-10-CM | POA: Diagnosis present

## 2019-01-24 DIAGNOSIS — R4182 Altered mental status, unspecified: Secondary | ICD-10-CM

## 2019-01-24 DIAGNOSIS — Z811 Family history of alcohol abuse and dependence: Secondary | ICD-10-CM | POA: Diagnosis not present

## 2019-01-24 DIAGNOSIS — Z793 Long term (current) use of hormonal contraceptives: Secondary | ICD-10-CM | POA: Diagnosis not present

## 2019-01-24 LAB — COMPREHENSIVE METABOLIC PANEL
ALT: 15 U/L (ref 0–44)
AST: 27 U/L (ref 15–41)
Albumin: 3.8 g/dL (ref 3.5–5.0)
Alkaline Phosphatase: 124 U/L (ref 38–126)
Anion gap: 11 (ref 5–15)
BUN: 19 mg/dL (ref 8–23)
CO2: 20 mmol/L — ABNORMAL LOW (ref 22–32)
Calcium: 9.1 mg/dL (ref 8.9–10.3)
Chloride: 107 mmol/L (ref 98–111)
Creatinine, Ser: 1.12 mg/dL (ref 0.61–1.24)
GFR calc Af Amer: >60 mL/min (ref >60)
GFR calc non Af Amer: >60 mL/min (ref >60)
Glucose, Bld: 118 mg/dL — ABNORMAL HIGH (ref 70–99)
Potassium: 4.5 mmol/L (ref 3.5–5.1)
Sodium: 138 mmol/L (ref 135–145)
Total Bilirubin: 0.7 mg/dL (ref 0.3–1.2)
Total Protein: 6.4 g/dL — ABNORMAL LOW (ref 6.5–8.1)

## 2019-01-24 LAB — URINALYSIS, ROUTINE W REFLEX MICROSCOPIC
Bilirubin Urine: NEGATIVE
Glucose, UA: NEGATIVE mg/dL
Hgb urine dipstick: NEGATIVE
Ketones, ur: NEGATIVE mg/dL
Leukocytes, UA: NEGATIVE
Nitrite: NEGATIVE
Protein, ur: NEGATIVE mg/dL
Specific Gravity, Urine: 1.014 (ref 1.005–1.030)
pH: 6 (ref 5.0–8.0)

## 2019-01-24 LAB — CBC WITH DIFFERENTIAL/PLATELET
Abs Immature Granulocytes: 0.03 10*3/uL (ref 0.00–0.07)
Basophils Absolute: 0.1 10*3/uL (ref 0.0–0.1)
Basophils Relative: 1 %
Eosinophils Absolute: 0.2 10*3/uL (ref 0.0–0.5)
Eosinophils Relative: 2 %
HCT: 42.2 % (ref 39.0–52.0)
Hemoglobin: 14.7 g/dL (ref 13.0–17.0)
Immature Granulocytes: 0 %
Lymphocytes Relative: 16 %
Lymphs Abs: 1.4 10*3/uL (ref 0.7–4.0)
MCH: 33.6 pg (ref 26.0–34.0)
MCHC: 34.8 g/dL (ref 30.0–36.0)
MCV: 96.6 fL (ref 80.0–100.0)
Monocytes Absolute: 0.7 10*3/uL (ref 0.1–1.0)
Monocytes Relative: 8 %
Neutro Abs: 6.4 10*3/uL (ref 1.7–7.7)
Neutrophils Relative %: 73 %
Platelets: 199 10*3/uL (ref 150–400)
RBC: 4.37 MIL/uL (ref 4.22–5.81)
RDW: 13.5 % (ref 11.5–15.5)
WBC: 8.8 10*3/uL (ref 4.0–10.5)
nRBC: 0 % (ref 0.0–0.2)

## 2019-01-24 LAB — POCT I-STAT 7, (LYTES, BLD GAS, ICA,H+H)
Acid-base deficit: 3 mmol/L — ABNORMAL HIGH (ref 0.0–2.0)
Bicarbonate: 21.6 mmol/L (ref 20.0–28.0)
Calcium, Ion: 1.26 mmol/L (ref 1.15–1.40)
HCT: 39 % (ref 39.0–52.0)
Hemoglobin: 13.3 g/dL (ref 13.0–17.0)
O2 Saturation: 95 %
Patient temperature: 95.8
Potassium: 4.3 mmol/L (ref 3.5–5.1)
Sodium: 137 mmol/L (ref 135–145)
TCO2: 23 mmol/L (ref 22–32)
pCO2 arterial: 34.2 mmHg (ref 32.0–48.0)
pH, Arterial: 7.401 (ref 7.350–7.450)
pO2, Arterial: 70 mmHg — ABNORMAL LOW (ref 83.0–108.0)

## 2019-01-24 LAB — RAPID URINE DRUG SCREEN, HOSP PERFORMED
Amphetamines: NOT DETECTED
Barbiturates: NOT DETECTED
Benzodiazepines: POSITIVE — AB
Cocaine: NOT DETECTED
Opiates: NOT DETECTED
Tetrahydrocannabinol: NOT DETECTED

## 2019-01-24 LAB — ACETAMINOPHEN LEVEL: Acetaminophen (Tylenol), Serum: <10 ug/mL — ABNORMAL LOW (ref 10–30)

## 2019-01-24 LAB — AMMONIA: Ammonia: 27 umol/L (ref 9–35)

## 2019-01-24 LAB — I-STAT TROPONIN, ED: Troponin i, poc: 0.01 ng/mL (ref 0.00–0.08)

## 2019-01-24 LAB — SALICYLATE LEVEL: Salicylate Lvl: <7 mg/dL (ref 2.8–30.0)

## 2019-01-24 LAB — ETHANOL: Alcohol, Ethyl (B): <10 mg/dL (ref <10)

## 2019-01-24 LAB — PHENYTOIN LEVEL, TOTAL: Phenytoin Lvl: <2.5 ug/mL — ABNORMAL LOW (ref 10.0–20.0)

## 2019-01-24 MED ORDER — ONDANSETRON HCL 4 MG/2ML IJ SOLN: 4.0000 mg | Freq: Four times a day (QID) | INTRAMUSCULAR | Status: DC | PRN

## 2019-01-24 MED ORDER — ENOXAPARIN SODIUM 40 MG/0.4ML ~~LOC~~ SOLN
40.0000 mg | SUBCUTANEOUS | Status: DC
  Administered 2019-01-24 – 2019-01-25 (×2): 40 mg via SUBCUTANEOUS
  Filled 2019-01-24 (×2): qty 0.4

## 2019-01-24 MED ORDER — ONDANSETRON HCL 4 MG PO TABS: 4.0000 mg | ORAL_TABLET | Freq: Four times a day (QID) | ORAL | Status: DC | PRN

## 2019-01-24 MED ORDER — LAMOTRIGINE 100 MG PO TABS
50.0000 mg | ORAL_TABLET | Freq: Every day | ORAL | Status: DC
  Administered 2019-01-25: 50 mg via ORAL
  Filled 2019-01-24: qty 1

## 2019-01-24 MED ORDER — LACOSAMIDE 50 MG PO TABS
150.0000 mg | ORAL_TABLET | Freq: Two times a day (BID) | ORAL | Status: DC
  Administered 2019-01-25 (×2): 150 mg via ORAL
  Filled 2019-01-24 (×2): qty 3

## 2019-01-24 MED ORDER — LAMOTRIGINE 100 MG PO TABS: 50.0000 mg | ORAL_TABLET | Freq: Two times a day (BID) | ORAL | Status: DC

## 2019-01-24 MED ORDER — DEXTROSE-NACL 5-0.9 % IV SOLN
INTRAVENOUS | Status: DC
  Administered 2019-01-24 – 2019-01-25 (×3): via INTRAVENOUS

## 2019-01-24 NOTE — ED Provider Notes (Signed)
Medical screening examination/treatment/procedure(s) were conducted as a shared visit with non-physician practitioner(s) and myself.  I personally evaluated the patient during the encounter.  EKG Interpretation  Date/Time:  Saturday January 24 2019 07:44:52 EST Ventricular Rate:  61 PR Interval:    QRS Duration: 125 QT Interval:  405 QTC Calculation: 408 R Axis:   -27 Text Interpretation:  Sinus rhythm Nonspecific intraventricular conduction delay Nonspecific T abnormalities, diffuse leads Confirmed by Virgel Manifold [phone]) on 01/24/2019 10:46:36 AM    62yM with AMS. Apparently has been going on since yesterday evening. No reported trauma or inappropriate ingestion. Reportedly male he lives with called EMS. No one with him in the ED. History primarily from review of records.   On exam he is laying in bed with eye closed. When stimulated he will moan and move extremities. Localizes noxious stimuli. No focal motor deficits noted. He will attempt to answer questions at time but speech is incomprehensible. Not following commands. Tachypnea but otherwise no accessory muscle usage and lungs clear. Heart RRR. Abdomen soft, nondistended and doesn't seem tender. No external signs of acute trauma. Surgical site to LLE seems to be healing w/o complication.   Suspect his may be med related. Also underlying psych history with psychiatry note from 01/22/19 describing him as anxious and nervous with chronic depression and paranoia. Meds changed 2d ago including starting ativan and lamictal. Hx of seizure but no recent reported and doesn't strike me as post-ictal. Core temp slightly low. No overt infectious etiology though. No leukocytosis. CXR and UA fine. CT head w/o acute abnormality. ABG fine.   11:05 AM Further history obtained by nursing from male that lives with patient. Apparently significant mental health history. Can be very paranoid and history of self castration. Reportedly took 5 ativan  yesterday at once then impulsively took several additional unknown medications when he didn't see an immediate effect.    Virgel Manifold, MD 01/24/19 1108

## 2019-01-24 NOTE — ED Notes (Signed)
Placed bair hugger on pt and condom cath.
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
## 2019-01-24 NOTE — ED Triage Notes (Signed)
Pt was given new prescription of ambien within this past week. Pt took for the first time last night at 1700. Pt's gf states he is noncompliant with medications, meds all over house. Pt's gf states he has been altered since 1800 last night. Unknown how much pt took. Pt curled up on the floor when gf found him. Pt responsive to voice, very lethargic. Pt alert to person, place, date but doesn't understand the circumstances. Room air sats 90%- EMS placed on 3L [place]; 98%. BP 130/80 initially. HR 60.
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
## 2019-01-24 NOTE — H&P (Signed)
History and Physical    [name] IRS:[id number] DOB: 11/27/1956 DOA: 01/24/2019  PCP: Leeanne Rio, MD  Patient coming from: home   I have personally briefly reviewed patient's old medical records available.   Chief Complaint: Altered mental status.  HPI: [name] is a 63 y.o. male with medical history significant of paranoid schizophrenia, seizure, anxiety, hypertension and hyperlipidemia who was brought to the ER by EMS with altered mentation.  Patient is currently obtunded and not able to provide any history.  He had seen his psychiatrist 2 days ago, I reviewed patient's records.  His girlfriend called ER to give about his history.  Patient is not accompanied by anybody in the hospital.  As per chart review,Patient has paranoid schizophrenia.  Patient has prescriptions of Ativan at home.  According to the girlfriend he might have taken multiple doses of Ativan.  Patient is totally obtunded, however his girlfriend denied any expression of suicidal or homicidal ideation by the patient.  She thinks that he takes bunch of medications, and he thinks he does not work so he takes additional medicine. ED Course: Patient is obtunded.  Hypothermic with temperature 95.8.  Blood pressures are stable.  His electrolytes are normal.  CO2 is normal.  He is on room air.  Blood sugars normal. Ammonia, alcohol, phenytoin level therapeutic, Tylenol and salicylate level were all within normal limits. CT head is normal.  No clinical evidence of infection.  Review of Systems: As per HPI otherwise 10 point review of systems negative.    Past Medical History:  Diagnosis Date  . Anxiety   . Constipation   . Depression   . Dyslipidemia   . GERD (gastroesophageal reflux disease)   . Hearing loss   . History of echocardiogram    Echo 8/18: EF 50-55, normal wall motion, grade 1 diastolic dysfunction, mild MR  . Hypertension   . Memory difficulty 10/25/2017  . Memory loss   . MRSA carrier   .  Schizoaffective disorder   . Seizures ([place])     Past Surgical History:  Procedure Laterality Date  . CARPAL TUNNEL RELEASE    . COLONOSCOPY  2010  . ORIF ANKLE FRACTURE Left 11/26/2018   Procedure: OPEN REDUCTION INTERNAL FIXATION (ORIF) ANKLE FRACTURE;  Surgeon: Hiram Gash, MD;  Location: [place];  Service: Orthopedics;  Laterality: Left;  . ORIF SHOULDER FRACTURE    . self orchectomy    . SHOULDER CLOSED REDUCTION Left 09/16/2013   Procedure: CLOSED REDUCTION SHOULDER;  Surgeon: Mauri Pole, MD;  Location: [facility];  Service: Orthopedics;  Laterality: Left;  . SHOULDER HEMI-ARTHROPLASTY Left 09/18/2013   Procedure: LEFT SHOULDER HEMI-ARTHROPLASTY;  Surgeon: Augustin Schooling, MD;  Location: [place];  Service: Orthopedics;  Laterality: Left;  . TIBIA IM NAIL INSERTION Left 11/26/2018   Procedure: INTRAMEDULLARY (IM) NAIL TIBIAL;  Surgeon: Hiram Gash, MD;  Location: [place];  Service: Orthopedics;  Laterality: Left;     reports that he has never smoked. He has never used smokeless tobacco. He reports that he does not drink alcohol or use drugs.  Allergies  Allergen Reactions  . Codeine Nausea And Vomiting  . Sulfa Antibiotics Nausea And Vomiting  . Sulfasalazine Nausea And Vomiting    Family History  Problem Relation Age of Onset  . Stroke Father        Living at 59  . Stroke Mother        Stroke in late 68's. Died in  her 90's  . Alcohol abuse Brother   . Alcohol abuse Brother   . Diabetes Brother   . Colon cancer Neg Hx   . Esophageal cancer Neg Hx   . Stomach cancer Neg Hx   . Rectal cancer Neg Hx      Prior to Admission medications   Medication Sig Start Date End Date Taking? Authorizing Provider  acetaminophen (TYLENOL) 500 MG tablet Take 1,000 mg by mouth every 6 (six) hours as needed for mild pain or moderate pain.    [provider]  amitriptyline (ELAVIL) 100 MG tablet Take 2 tablets (200 mg total) by mouth at bedtime. 01/22/19   Arfeen, Arlyce Harman, MD    atorvastatin (LIPITOR) 40 MG tablet Take 1 tablet (40 mg total) by mouth daily. Patient taking differently: Take 40 mg by mouth at bedtime.  04/10/18   Leeanne Rio, MD  cephALEXin (KEFLEX) 500 MG capsule Take 1 capsule (500 mg total) by mouth 4 (four) times daily. 01/13/19   Petrucelli, Samantha R, PA-C  hydrochlorothiazide (HYDRODIURIL) 25 MG tablet Take 1 tablet (25 mg total) by mouth daily. Patient taking differently: Take 25 mg by mouth at bedtime.  08/11/18   Alveda Reasons, MD  lamoTRIgine (LAMICTAL) 25 MG tablet Take one tab daily for 1 week and than 2 tab daily 01/22/19   Arfeen, Arlyce Harman, MD  LORazepam (ATIVAN) 0.5 MG tablet Take one to two tab as needed for anxiety 01/22/19   Arfeen, Arlyce Harman, MD  losartan (COZAAR) 25 MG tablet TAKE 2 TABLETS BY MOUTH EVERY DAY 01/23/19   Leeanne Rio, MD  metoprolol succinate (TOPROL-XL) 100 MG 24 hr tablet TAKE 2 TABLETS (200 MG TOTAL) DAILY BY MOUTH. Patient taking differently: Take 200 mg by mouth at bedtime.  10/16/18   Zenia Resides, MD  Multiple Vitamin (MULTIVITAMIN WITH MINERALS) TABS tablet Take 1 tablet by mouth daily.    [provider]  phenytoin (DILANTIN) 100 MG ER capsule Take 1 capsule (100 mg total) by mouth at bedtime. 09/12/18   Kathrynn Ducking, MD  polyethylene glycol powder ([employer] PURELAX) powder TAKE 17 GRAMS 2 TIMES A DAY AS NEEDED FOR CONSTIPATION. MIX INTO 8 OF FLUID AND DRINK Patient taking differently: Take 17 g by mouth daily. Mix into 8 oz of fluid and drink 09/18/18   Dickie La, MD  QUEtiapine (SEROQUEL) 300 MG tablet Take 2 tablets (600 mg total) by mouth at bedtime. 01/22/19   Arfeen, Arlyce Harman, MD  testosterone cypionate (DEPOTESTOSTERONE CYPIONATE) 200 MG/ML injection Inject 0.75 mLs (150 mg total) into the muscle every 14 (fourteen) days. 09/12/16   Leeanne Rio, MD  VIMPAT 150 MG TABS TAKE 1 TABLET BY MOUTH TWICE A DAY Patient taking differently: Take 150 mg by mouth 2 (two) times daily.   01/01/19   Ward Givens, NP    Physical Exam: Vitals:   01/24/19 1015 01/24/19 1030 01/24/19 1045 01/24/19 1100  BP: 112/81 114/64 104/72 113/65  Pulse: (!) 58 (!) 57 (!) 58 (!) 58  Resp: (!) 26 (!) 28 (!) 25 (!) 29  Temp:      TempSrc:      SpO2: 94% 95% 95% 94%    Constitutional: NAD, calm, comfortable Vitals:   01/24/19 1015 01/24/19 1030 01/24/19 1045 01/24/19 1100  BP: 112/81 114/64 104/72 113/65  Pulse: (!) 58 (!) 57 (!) 58 (!) 58  Resp: (!) 26 (!) 28 (!) 25 (!) 29  Temp:  TempSrc:      SpO2: 94% 95% 95% 94%   Eyes: PERRL, lids and conjunctivae normal ENMT: Mucous membranes are moist. Posterior pharynx clear of any exudate or lesions.Normal dentition.  Neck: normal, supple, no masses, no thyromegaly Respiratory: clear to auscultation bilaterally, no wheezing, no crackles. Normal respiratory effort. No accessory muscle use.  Cardiovascular: Regular rate and rhythm, no murmurs / rubs / gallops. No extremity edema. 2+ pedal pulses. No carotid bruits.  Abdomen: no tenderness, no masses palpated. No hepatosplenomegaly. Bowel sounds positive.   Musculoskeletal: no clubbing / cyanosis. No joint deformity upper and lower extremities. Good ROM, no contractures. Normal muscle tone.  Skin: no rashes, lesions, ulcers. No induration Neurologic: CN 2-12 grossly intact.  DTR normal. Strength 5/5 in all 4.  Psychiatric:  Patient is obtunded. He does withdraw all extremities on painful stimuli. Occasional unprovoked movements of the legs. No interaction.    Labs on Admission: I have personally reviewed following labs and imaging studies  CBC: Recent Labs  Lab 01/24/19 0820 01/24/19 1044  WBC 8.8  --   NEUTROABS 6.4  --   HGB 14.7 13.3  HCT 42.2 39.0  MCV 96.6  --   PLT 199  --    Basic Metabolic Panel: Recent Labs  Lab 01/24/19 0820 01/24/19 1044  NA 138 137  K 4.5 4.3  CL 107  --   CO2 20*  --   GLUCOSE 118*  --   BUN 19  --   CREATININE 1.12  --    CALCIUM 9.1  --    GFR: Estimated Creatinine Clearance: 76.8 mL/min (by C-G formula based on SCr of 1.12 mg/dL). Liver Function Tests: Recent Labs  Lab 01/24/19 0820  AST 27  ALT 15  ALKPHOS 124  BILITOT 0.7  PROT 6.4*  ALBUMIN 3.8   No results for input(s): LIPASE, AMYLASE in the last 168 hours. Recent Labs  Lab 01/24/19 0955  AMMONIA 27   Coagulation Profile: No results for input(s): INR, PROTIME in the last 168 hours. Cardiac Enzymes: No results for input(s): CKTOTAL, CKMB, CKMBINDEX, TROPONINI in the last 168 hours. BNP (last 3 results) No results for input(s): PROBNP in the last 8760 hours. HbA1C: No results for input(s): HGBA1C in the last 72 hours. CBG: No results for input(s): GLUCAP in the last 168 hours. Lipid Profile: No results for input(s): CHOL, HDL, LDLCALC, TRIG, CHOLHDL, LDLDIRECT in the last 72 hours. Thyroid Function Tests: No results for input(s): TSH, T4TOTAL, FREET4, T3FREE, THYROIDAB in the last 72 hours. Anemia Panel: No results for input(s): VITAMINB12, FOLATE, FERRITIN, TIBC, IRON, RETICCTPCT in the last 72 hours. Urine analysis:    Component Value Date/Time   COLORURINE YELLOW 01/24/2019 0820   APPEARANCEUR CLEAR 01/24/2019 0820   LABSPEC 1.014 01/24/2019 0820   PHURINE 6.0 01/24/2019 0820   GLUCOSEU NEGATIVE 01/24/2019 0820   HGBUR NEGATIVE 01/24/2019 0820   HGBUR negative 07/18/2010 1436   BILIRUBINUR NEGATIVE 01/24/2019 0820   BILIRUBINUR NEG 01/17/2016 1446   KETONESUR NEGATIVE 01/24/2019 0820   PROTEINUR NEGATIVE 01/24/2019 0820   UROBILINOGEN 0.2 01/17/2016 1446   UROBILINOGEN 0.2 07/29/2015 1708   NITRITE NEGATIVE 01/24/2019 0820   LEUKOCYTESUR NEGATIVE 01/24/2019 0820    Radiological Exams on Admission: Ct Head Wo Contrast  Result Date: 01/24/2019 CLINICAL DATA:  Altered mental status/lethargy EXAM: CT HEAD WITHOUT CONTRAST TECHNIQUE: Contiguous axial images were obtained from the base of the skull through the vertex  without intravenous contrast. COMPARISON:  January 13, 2019  FINDINGS: Brain: There is mild age related volume loss, stable. There is no intracranial mass, hemorrhage, extra-axial fluid collection, or midline shift. There is minimal small vessel disease in the centra semiovale bilaterally. No acute infarct evident. Vascular: No hyperdense vessel. There is slight calcification in the distal right vertebral artery. There is slight calcification in the right carotid siphon region. Skull: The bony calvarium appears intact. Sinuses/Orbits: There is a probable retention cyst in a posterior right ethmoid air cell. There is slight mucosal thickening in several ethmoid air cells. Other paranasal sinuses are clear. Orbits appear symmetric bilaterally. Other: Mastoid air cells are clear. There is debris in each external auditory canal. IMPRESSION: Age related volume loss with minimal periventricular small vessel disease. No acute infarct. No mass or hemorrhage. Slight arterial vascular calcification noted. There is mild paranasal sinus disease. There is probable cerumen in each external auditory canal. Electronically Signed   By: Lowella Grip III M.D.   On: 01/24/2019 09:29   Dg Chest Portable 1 View  Result Date: 01/24/2019 CLINICAL DATA:  Altered mental status. EXAM: PORTABLE CHEST 1 VIEW COMPARISON:  08/07/2018 FINDINGS: The cardiac silhouette, mediastinal and hilar contours are within normal limits and stable. Low lung volumes with vascular crowding and streaky basilar atelectasis. No definite infiltrates or effusions. The bony thorax is intact. Surgical changes involving both shoulders. IMPRESSION: Low lung volumes with vascular crowding and streaky basilar atelectasis. No definite infiltrates or effusions. Electronically Signed   By: Marijo Sanes M.D.   On: 01/24/2019 09:48    EKG: Independently reviewed.  Normal sinus rhythm with no acute changes.  Assessment/Plan Principal Problem:   Acute metabolic  encephalopathy Active Problems:   Hyperlipidemia   Seizure disorder (HCC)   Schizoaffective disorder (HCC)   Benzodiazepine dependence, continuous (HCC)   Essential hypertension   Encephalopathy acute     1.  Acute metabolic encephalopathy: Due to polypharmacy.  Suspect excess use of benzodiazepines.  Patient is currently hemodynamically stable.  Hypothermia is probably due to environmental temperature when he was found at home.  No evidence of infection. Admit to stepdown unit.  Neurochecks every 2 hours. N.p.o. until awake.  Hold all medications including phenytoin.  Will restart phenytoin when he is awake. No evidence of suicidal or lethality, however patient does have extensive psychiatric problem. Once patient is more awake, will interview him again.  2.  Seizure disorder: Unlikely seizure and postictal.  Patient was witnessed just more sleepy without seizure by his girlfriend.  Will resume Vimpat and phenytoin when he wakes up.  3.  Paranoid schizophrenia, benzodiazepine dependence now with overdose: We will ask for psychiatry evaluation once patient is awake.  Patient may need medication adjustment.  4.  Hypertension: Hold antihypertensives until able to take by mouth.    DVT prophylaxis: Lovenox subcu. Code Status: Full code. Family Communication: No family at bedside. Disposition Plan: Home. Consults called: None. Admission status: Inpatient.  Progressive unit for neuro checks.   Barb Merino MD [name] Pager [phone]  If 7PM-7AM, please contact night-coverage [email or web address] Password [facility]  01/24/2019, 11:23 AM

## 2019-01-25 DIAGNOSIS — E785 Hyperlipidemia, unspecified: Secondary | ICD-10-CM

## 2019-01-25 DIAGNOSIS — F1320 Sedative, hypnotic or anxiolytic dependence, uncomplicated: Secondary | ICD-10-CM

## 2019-01-25 DIAGNOSIS — F259 Schizoaffective disorder, unspecified: Secondary | ICD-10-CM

## 2019-01-25 DIAGNOSIS — F329 Major depressive disorder, single episode, unspecified: Secondary | ICD-10-CM

## 2019-01-25 DIAGNOSIS — I10 Essential (primary) hypertension: Secondary | ICD-10-CM

## 2019-01-25 DIAGNOSIS — G40909 Epilepsy, unspecified, not intractable, without status epilepticus: Secondary | ICD-10-CM

## 2019-01-25 MED ORDER — POLYETHYLENE GLYCOL 3350 17 G PO PACK
17.0000 g | PACK | Freq: Once | ORAL | Status: AC
  Administered 2019-01-25: 17 g via ORAL
  Filled 2019-01-25: qty 1

## 2019-01-25 MED ORDER — QUETIAPINE FUMARATE 50 MG PO TABS: 400.0000 mg | ORAL_TABLET | Freq: Every day | ORAL | Status: DC

## 2019-01-25 MED ORDER — AMITRIPTYLINE HCL 25 MG PO TABS: 100.0000 mg | ORAL_TABLET | Freq: Every day | ORAL | Status: DC

## 2019-01-25 NOTE — Discharge Summary (Signed)
Physician Discharge Summary  [name] KVQ:[id number] DOB: 04-19-1956 DOA: 01/24/2019  PCP: Leeanne Rio, MD  Admit date: 01/24/2019 Discharge date: 01/25/2019  Admitted From: Inpatient Disposition: home  Recommendations for Outpatient Follow-up:  1. Follow up with PCP in 1-2 weeks 2. Please obtain BMP/CBC in one week 3. Please follow up on the following pending results:  Home Health:No Equipment/Devices:n  Discharge Condition:Stable CODE STATUS:Full code Diet recommendation: Regular healthy diet  Brief/Interim Summary: [name] is a 63 y.o. male with medical history significant of paranoid schizophrenia, seizure, anxiety, hypertension and hyperlipidemia who was brought to the ER by EMS with altered mentation.  Patient is currently obtunded and not able to provide any history.  He had seen his psychiatrist 2 days ago, I reviewed patient's records.  His girlfriend called ER to give about his history.  Patient is not accompanied by anybody in the hospital.  As per chart review,Patient has paranoid schizophrenia.  Patient has prescriptions of Ativan at home.  According to the girlfriend he might have taken multiple doses of Ativan.  Patient is totally obtunded, however his girlfriend denied any expression of suicidal or homicidal ideation by the patient.  She thinks that he takes bunch of medications, and he thinks he does not work so he takes additional medicine. ED Course: Patient is obtunded.  Hypothermic with temperature 95.8.  Blood pressures are stable.  His electrolytes are normal.  CO2 is normal.  He is on room air.  Blood sugars normal. Ammonia, alcohol, phenytoin level therapeutic, Tylenol and salicylate level were all within normal limits. CT head is normal.  No clinical evidence of infection.  Hospital course: Acute metabolic encephalopathy.  This was secondary to polypharmacy.  Patient reported he was more anxious and took additional benzodiazepines.  Patient is mental  status cleared overnight.  He was seen by psychiatry with evaluation for any suicidal intent which was absent.  They felt he was stable to be discharged home to continue his home medications.  Seizure disorder.  Patient will continue his home seizure medications of Vimpat and Lamictal.  He indicates he does not  take phenytoin which explains the level of less than 2.5  Paranoid schizophrenia with benzodiazepine dependence.  As indicated patient was seen by psychiatry he will continue his home medications I will not adjust her psychotropic medications at this point will defer to his outpatient treating subspecialist and psychiatry.  Hypertension.  Patient continue his home medications on discharge no changes.  Discharge Diagnoses:  Principal Problem:   Acute metabolic encephalopathy Active Problems:   Hyperlipidemia   Seizure disorder (HCC)   Schizoaffective disorder (HCC)   Benzodiazepine dependence, continuous ([place])   Essential hypertension   Encephalopathy acute    Discharge Instructions  Discharge Instructions    Diet - low sodium heart healthy   Complete by:  As directed    Increase activity slowly   Complete by:  As directed      Allergies as of 01/25/2019      Reactions   Codeine Nausea And Vomiting   Sulfa Antibiotics Nausea And Vomiting   Sulfasalazine Nausea And Vomiting      Medication List    TAKE these medications   acetaminophen 500 MG tablet Commonly known as:  TYLENOL Take 1,000 mg by mouth every 6 (six) hours as needed for mild pain or moderate pain.   amitriptyline 100 MG tablet Commonly known as:  ELAVIL Take 2 tablets (200 mg total) by mouth at bedtime.  atorvastatin 40 MG tablet Commonly known as:  LIPITOR Take 1 tablet (40 mg total) by mouth daily. What changed:  when to take this   cephALEXin 500 MG capsule Commonly known as:  KEFLEX Take 1 capsule (500 mg total) by mouth 4 (four) times daily.   hydrochlorothiazide 25 MG tablet Commonly  known as:  HYDRODIURIL Take 1 tablet (25 mg total) by mouth daily. What changed:  when to take this   lamoTRIgine 25 MG tablet Commonly known as:  LAMICTAL Take one tab daily for 1 week and than 2 tab daily What changed:    how much to take  when to take this  additional instructions   LORazepam 0.5 MG tablet Commonly known as:  ATIVAN Take one to two tab as needed for anxiety What changed:    how much to take  how to take this  when to take this  reasons to take this  additional instructions   losartan 25 MG tablet Commonly known as:  COZAAR TAKE 2 TABLETS BY MOUTH EVERY DAY   metoprolol succinate 100 MG 24 hr tablet Commonly known as:  TOPROL-XL TAKE 2 TABLETS (200 MG TOTAL) DAILY BY MOUTH. What changed:  when to take this   multivitamin with minerals Tabs tablet Take 1 tablet by mouth daily.   phenytoin 100 MG ER capsule Commonly known as:  DILANTIN Take 1 capsule (100 mg total) by mouth at bedtime.   polyethylene glycol powder powder Commonly known as:  [employer] PURELAX TAKE 17 GRAMS 2 TIMES A DAY AS NEEDED FOR CONSTIPATION. MIX INTO 8 OF FLUID AND DRINK What changed:    how much to take  how to take this  when to take this  additional instructions   QUEtiapine 300 MG tablet Commonly known as:  SEROQUEL Take 2 tablets (600 mg total) by mouth at bedtime.   testosterone cypionate 200 MG/ML injection Commonly known as:  DEPOTESTOSTERONE CYPIONATE Inject 0.75 mLs (150 mg total) into the muscle every 14 (fourteen) days.   VIMPAT 150 MG Tabs Generic drug:  Lacosamide TAKE 1 TABLET BY MOUTH TWICE A DAY What changed:  how much to take       Allergies  Allergen Reactions  . Codeine Nausea And Vomiting  . Sulfa Antibiotics Nausea And Vomiting  . Sulfasalazine Nausea And Vomiting    Consultations:  Psychiatry   Procedures/Studies: Ct Head Wo Contrast  Result Date: 01/24/2019 CLINICAL DATA:  Altered mental status/lethargy EXAM: CT HEAD  WITHOUT CONTRAST TECHNIQUE: Contiguous axial images were obtained from the base of the skull through the vertex without intravenous contrast. COMPARISON:  January 13, 2019 FINDINGS: Brain: There is mild age related volume loss, stable. There is no intracranial mass, hemorrhage, extra-axial fluid collection, or midline shift. There is minimal small vessel disease in the centra semiovale bilaterally. No acute infarct evident. Vascular: No hyperdense vessel. There is slight calcification in the distal right vertebral artery. There is slight calcification in the right carotid siphon region. Skull: The bony calvarium appears intact. Sinuses/Orbits: There is a probable retention cyst in a posterior right ethmoid air cell. There is slight mucosal thickening in several ethmoid air cells. Other paranasal sinuses are clear. Orbits appear symmetric bilaterally. Other: Mastoid air cells are clear. There is debris in each external auditory canal. IMPRESSION: Age related volume loss with minimal periventricular small vessel disease. No acute infarct. No mass or hemorrhage. Slight arterial vascular calcification noted. There is mild paranasal sinus disease. There is probable cerumen in each  external auditory canal. Electronically Signed   By: Lowella Grip III M.D.   On: 01/24/2019 09:29   Ct Head Wo Contrast  Result Date: 01/13/2019 CLINICAL DATA:  Encephalopathy EXAM: CT HEAD WITHOUT CONTRAST TECHNIQUE: Contiguous axial images were obtained from the base of the skull through the vertex without intravenous contrast. Sagittal and coronal MPR images reconstructed from axial data set. COMPARISON:  07/29/2015 FINDINGS: Brain: Mild generalized atrophy. Normal ventricular morphology. No midline shift or mass effect. Otherwise normal appearance of brain parenchyma. No intracranial hemorrhage, mass lesion, or acute infarction. No extra-axial fluid collections. Vascular: Unremarkable Skull: Intact Sinuses/Orbits: Clear Other: N/A  IMPRESSION: No acute intracranial abnormalities. No interval change. Electronically Signed   By: Lavonia Dana M.D.   On: 01/13/2019 16:42   Dg Chest Portable 1 View  Result Date: 01/24/2019 CLINICAL DATA:  Altered mental status. EXAM: PORTABLE CHEST 1 VIEW COMPARISON:  08/07/2018 FINDINGS: The cardiac silhouette, mediastinal and hilar contours are within normal limits and stable. Low lung volumes with vascular crowding and streaky basilar atelectasis. No definite infiltrates or effusions. The bony thorax is intact. Surgical changes involving both shoulders. IMPRESSION: Low lung volumes with vascular crowding and streaky basilar atelectasis. No definite infiltrates or effusions. Electronically Signed   By: Marijo Sanes M.D.   On: 01/24/2019 09:48       Subjective: Patient reports he feels status baseline.  He is awake alert oriented answering questions appropriately.  Discharge Exam: Vitals:   01/25/19 0754 01/25/19 1228  BP: 119/75 (P) 139/76  Pulse: 70 (P) 78  Resp: 18 (P) 18  Temp: 97.7 F (36.5 C) (P) 98.4 F (36.9 C)  SpO2: 98% (P) 99%   Vitals:   01/24/19 2300 01/25/19 0332 01/25/19 0754 01/25/19 1228  BP: 128/71 132/84 119/75 (P) 139/76  Pulse: 64 66 70 (P) 78  Resp: 20 (!) 22 18 (P) 18  Temp: 97.7 F (36.5 C) 97.7 F (36.5 C) 97.7 F (36.5 C) (P) 98.4 F (36.9 C)  TempSrc: Axillary Axillary Oral (P) Oral  SpO2: 95% 96% 98% (P) 99%    General: Pt is alert, awake, not in acute distress Cardiovascular: RRR, S1/S2 +, no rubs, no gallops Respiratory: CTA bilaterally, no wheezing, no rhonchi Abdominal: Soft, NT, ND, bowel sounds + Extremities: no edema, no cyanosis    The results of significant diagnostics from this hospitalization (including imaging, microbiology, ancillary and laboratory) are listed below for reference.     Microbiology: No results found for this or any previous visit (from the past 240 hour(s)).   Labs: BNP (last 3 results) No results for  input(s): BNP in the last 8760 hours. Basic Metabolic Panel: Recent Labs  Lab 01/24/19 0820 01/24/19 1044  NA 138 137  K 4.5 4.3  CL 107  --   CO2 20*  --   GLUCOSE 118*  --   BUN 19  --   CREATININE 1.12  --   CALCIUM 9.1  --    Liver Function Tests: Recent Labs  Lab 01/24/19 0820  AST 27  ALT 15  ALKPHOS 124  BILITOT 0.7  PROT 6.4*  ALBUMIN 3.8   No results for input(s): LIPASE, AMYLASE in the last 168 hours. Recent Labs  Lab 01/24/19 0955  AMMONIA 27   CBC: Recent Labs  Lab 01/24/19 0820 01/24/19 1044  WBC 8.8  --   NEUTROABS 6.4  --   HGB 14.7 13.3  HCT 42.2 39.0  MCV 96.6  --   PLT 199  --  Cardiac Enzymes: No results for input(s): CKTOTAL, CKMB, CKMBINDEX, TROPONINI in the last 168 hours. BNP: Invalid input(s): POCBNP CBG: No results for input(s): GLUCAP in the last 168 hours. D-Dimer No results for input(s): DDIMER in the last 72 hours. Hgb A1c No results for input(s): HGBA1C in the last 72 hours. Lipid Profile No results for input(s): CHOL, HDL, LDLCALC, TRIG, CHOLHDL, LDLDIRECT in the last 72 hours. Thyroid function studies No results for input(s): TSH, T4TOTAL, T3FREE, THYROIDAB in the last 72 hours.  Invalid input(s): FREET3 Anemia work up No results for input(s): VITAMINB12, FOLATE, FERRITIN, TIBC, IRON, RETICCTPCT in the last 72 hours. Urinalysis    Component Value Date/Time   COLORURINE YELLOW 01/24/2019 0820   APPEARANCEUR CLEAR 01/24/2019 0820   LABSPEC 1.014 01/24/2019 0820   PHURINE 6.0 01/24/2019 0820   GLUCOSEU NEGATIVE 01/24/2019 0820   HGBUR NEGATIVE 01/24/2019 0820   HGBUR negative 07/18/2010 1436   BILIRUBINUR NEGATIVE 01/24/2019 0820   BILIRUBINUR NEG 01/17/2016 1446   KETONESUR NEGATIVE 01/24/2019 0820   PROTEINUR NEGATIVE 01/24/2019 0820   UROBILINOGEN 0.2 01/17/2016 1446   UROBILINOGEN 0.2 07/29/2015 1708   NITRITE NEGATIVE 01/24/2019 0820   LEUKOCYTESUR NEGATIVE 01/24/2019 0820   Sepsis Labs Invalid  input(s): PROCALCITONIN,  WBC,  LACTICIDVEN Microbiology No results found for this or any previous visit (from the past 240 hour(s)).   Time coordinating discharge: Over 30 minutes  SIGNED:   Nicolette Bang, MD  [name] 01/25/2019, 3:18 PM Pager   If 7PM-7AM, please contact night-coverage [email or web address] Password [email or web address]

## 2019-01-25 NOTE — Consult Note (Signed)
[facility] Consult   Reason for Consult: ''OD on Benzodiazepine'' Referring Physician:  Dr. Francine Graven Patient Identification: [name] MRN:  [id number] Principal Diagnosis: Acute metabolic encephalopathy Diagnosis:  Principal Problem:   Acute metabolic encephalopathy Active Problems:   Hyperlipidemia   Seizure disorder ([place])   Schizoaffective disorder ([place])   Benzodiazepine dependence, continuous ([place])   Essential hypertension   Encephalopathy acute   Total Time spent with patient: 45 minutes  Subjective:   [name] is a 63 y.o. male patient admitted with altered mental status.  HPI: Patient who reports history of Schizoaffective disorder, Anxiety, Depression and  Seizure disorder who reports that was admitted to the hospital due to altered mental status after he took about 6-8 tablets of 0.5mg  Ativan to relax. He vehemently denies trying to kill himself and says he took the Ativan to relax because he was feeling stressed out while going through some documents at his home with his new roommate. He states that the lady that brought him to the hospital is not his girlfriend but an old friend that needs a place to live that he brought in, but he acknowledged that the lady has been helping taking of him since he had a foot injury. Patient reports that he was stable on Elavil, 2 tabs of 100 mg at bedtime and Seroquel 600 mg daily at bedtime. Today, patient denies SI/HI, depression, anxiety, psychosis or delusional thinking. He denies drugs and alcohol abuse and does not want to be prescribed Ativan again. Past Psychiatric History: as above  Risk to Self:  denies Risk to Others:  denies Prior Inpatient Therapy:  many years ago Prior Outpatient Therapy:  [facility] outpatient   Past Medical History:  Past Medical History:  Diagnosis Date  . Anxiety   . Constipation   . Depression   . Dyslipidemia   . GERD (gastroesophageal reflux disease)   . Hearing loss   . History  of echocardiogram    Echo 8/18: EF 50-55, normal wall motion, grade 1 diastolic dysfunction, mild MR  . Hypertension   . Memory difficulty 10/25/2017  . Memory loss   . MRSA carrier   . Schizoaffective disorder   . Seizures ([place])     Past Surgical History:  Procedure Laterality Date  . CARPAL TUNNEL RELEASE    . COLONOSCOPY  2010  . ORIF ANKLE FRACTURE Left 11/26/2018   Procedure: OPEN REDUCTION INTERNAL FIXATION (ORIF) ANKLE FRACTURE;  Surgeon: Hiram Gash, MD;  Location: [facility];  Service: Orthopedics;  Laterality: Left;  . ORIF SHOULDER FRACTURE    . self orchectomy    . SHOULDER CLOSED REDUCTION Left 09/16/2013   Procedure: CLOSED REDUCTION SHOULDER;  Surgeon: Mauri Pole, MD;  Location: [facility];  Service: Orthopedics;  Laterality: Left;  . SHOULDER HEMI-ARTHROPLASTY Left 09/18/2013   Procedure: LEFT SHOULDER HEMI-ARTHROPLASTY;  Surgeon: Augustin Schooling, MD;  Location: [place];  Service: Orthopedics;  Laterality: Left;  . TIBIA IM NAIL INSERTION Left 11/26/2018   Procedure: INTRAMEDULLARY (IM) NAIL TIBIAL;  Surgeon: Hiram Gash, MD;  Location: [facility];  Service: Orthopedics;  Laterality: Left;   Family History:  Family History  Problem Relation Age of Onset  . Stroke Father        Living at 74  . Stroke Mother        Stroke in late 28's. Died in her 4's  . Alcohol abuse Brother   . Alcohol abuse Brother   . Diabetes Brother   .  Colon cancer Neg Hx   . Esophageal cancer Neg Hx   . Stomach cancer Neg Hx   . Rectal cancer Neg Hx    Family Psychiatric  History:  Social History:  Patient lives alone but recently brought in a room mate Social History   Substance and Sexual Activity  Alcohol Use No  . Alcohol/week: 0.0 standard drinks     Social History   Substance and Sexual Activity  Drug Use No    Social History   Socioeconomic History  . Marital status: Single    Spouse name: Not on file  . Number of children: 0  . Years of education: 59  . Highest education  level: Not on file  Occupational History  . Occupation: disabled  Social Needs  . Financial resource strain: Not hard at all  . Food insecurity:    Worry: Never true    Inability: Never true  . Transportation needs:    Medical: No    Non-medical: No  Tobacco Use  . Smoking status: Never Smoker  . Smokeless tobacco: Never Used  Substance and Sexual Activity  . Alcohol use: No    Alcohol/week: 0.0 standard drinks  . Drug use: No  . Sexual activity: Not Currently    Partners: Female    Birth control/protection: None  Lifestyle  . Physical activity:    Days per week: 0 days    Minutes per session: 0 min  . Stress: Only a little  Relationships  . Social connections:    Talks on phone: More than three times a week    Gets together: More than three times a week    Attends religious service: Never    Active member of club or organization: No    Attends meetings of clubs or organizations: Never    Relationship status: Not on file  Other Topics Concern  . Not on file  Social History Narrative   Patient does not drink caffeine.   Patient is left handed.   Additional Social History:    Allergies:   Allergies  Allergen Reactions  . Codeine Nausea And Vomiting  . Sulfa Antibiotics Nausea And Vomiting  . Sulfasalazine Nausea And Vomiting    Labs:  Results for orders placed or performed during the hospital encounter of 01/24/19 (from the past 48 hour(s))  Comprehensive metabolic panel     Status: Abnormal   Collection Time: 01/24/19  8:20 AM  Result Value Ref Range   Sodium 138 135 - 145 mmol/L   Potassium 4.5 3.5 - 5.1 mmol/L   Chloride 107 98 - 111 mmol/L   CO2 20 (L) 22 - 32 mmol/L   Glucose, Bld 118 (H) 70 - 99 mg/dL   BUN 19 8 - 23 mg/dL   Creatinine, Ser 1.12 0.61 - 1.24 mg/dL   Calcium 9.1 8.9 - 10.3 mg/dL   Total Protein 6.4 (L) 6.5 - 8.1 g/dL   Albumin 3.8 3.5 - 5.0 g/dL   AST 27 15 - 41 U/L   ALT 15 0 - 44 U/L   Alkaline Phosphatase 124 38 - 126 U/L    Total Bilirubin 0.7 0.3 - 1.2 mg/dL   GFR calc non Af Amer >60 >60 mL/min   GFR calc Af Amer >60 >60 mL/min   Anion gap 11 5 - 15    Comment: Performed at [facility], [address], [place], [place] [address]  CBC with Differential     Status: None  Collection Time: 01/24/19  8:20 AM  Result Value Ref Range   WBC 8.8 4.0 - 10.5 K/uL   RBC 4.37 4.22 - 5.81 MIL/uL   Hemoglobin 14.7 13.0 - 17.0 g/dL   HCT 42.2 39.0 - 52.0 %   MCV 96.6 80.0 - 100.0 fL   MCH 33.6 26.0 - 34.0 pg   MCHC 34.8 30.0 - 36.0 g/dL   RDW 13.5 11.5 - 15.5 %   Platelets 199 150 - 400 K/uL   nRBC 0.0 0.0 - 0.2 %   Neutrophils Relative % 73 %   Neutro Abs 6.4 1.7 - 7.7 K/uL   Lymphocytes Relative 16 %   Lymphs Abs 1.4 0.7 - 4.0 K/uL   Monocytes Relative 8 %   Monocytes Absolute 0.7 0.1 - 1.0 K/uL   Eosinophils Relative 2 %   Eosinophils Absolute 0.2 0.0 - 0.5 K/uL   Basophils Relative 1 %   Basophils Absolute 0.1 0.0 - 0.1 K/uL   Immature Granulocytes 0 %   Abs Immature Granulocytes 0.03 0.00 - 0.07 K/uL    Comment: Performed at [facility], [address], [place], [place] [address]  Urinalysis, Routine w reflex microscopic     Status: None   Collection Time: 01/24/19  8:20 AM  Result Value Ref Range   Color, Urine YELLOW YELLOW   APPearance CLEAR CLEAR   Specific Gravity, Urine 1.014 1.005 - 1.030   pH 6.0 5.0 - 8.0   Glucose, UA NEGATIVE NEGATIVE mg/dL   Hgb urine dipstick NEGATIVE NEGATIVE   Bilirubin Urine NEGATIVE NEGATIVE   Ketones, ur NEGATIVE NEGATIVE mg/dL   Protein, ur NEGATIVE NEGATIVE mg/dL   Nitrite NEGATIVE NEGATIVE   Leukocytes, UA NEGATIVE NEGATIVE    Comment: Performed at [facility] [address], [place], [place] [address]  Acetaminophen level     Status: Abnormal   Collection Time: 01/24/19  8:20 AM  Result Value Ref Range   Acetaminophen (Tylenol), Serum <10 (L) 10 - 30 ug/mL    Comment: (NOTE) Therapeutic concentrations vary significantly. A range of  10-30 ug/mL  may be an effective concentration for many patients. However, some  are best treated at concentrations outside of this range. Acetaminophen concentrations >150 ug/mL at 4 hours after ingestion  and >50 ug/mL at 12 hours after ingestion are often associated with  toxic reactions. Performed at [facility], [place] [address] [place], [place] [address]   Salicylate level     Status: None   Collection Time: 01/24/19  8:20 AM  Result Value Ref Range   Salicylate Lvl <5.5 2.8 - 30.0 mg/dL    Comment: Performed at [facility] [address] [place], [place] [address]  Urine rapid drug screen (hosp performed)     Status: Abnormal   Collection Time: 01/24/19  8:22 AM  Result Value Ref Range   Opiates NONE DETECTED NONE DETECTED   Cocaine NONE DETECTED NONE DETECTED   Benzodiazepines POSITIVE (A) NONE DETECTED   Amphetamines NONE DETECTED NONE DETECTED   Tetrahydrocannabinol NONE DETECTED NONE DETECTED   Barbiturates NONE DETECTED NONE DETECTED    Comment: (NOTE) DRUG SCREEN FOR MEDICAL PURPOSES ONLY.  IF CONFIRMATION IS NEEDED FOR ANY PURPOSE, NOTIFY LAB WITHIN 5 DAYS. LOWEST DETECTABLE LIMITS FOR URINE DRUG SCREEN Drug Class                     Cutoff (ng/mL) Amphetamine and metabolites    1000  Barbiturate and metabolites    200 Benzodiazepine                 546 Tricyclics and metabolites     300 Opiates and metabolites        300 Cocaine and metabolites        300 THC                            50 Performed at [facility], [place] [address], [place], [place] [address]   I-stat troponin, ED     Status: None   Collection Time: 01/24/19  8:27 AM  Result Value Ref Range   Troponin i, poc 0.01 0.00 - 0.08 ng/mL   Comment 3            Comment: Due to the release kinetics of cTnI, a negative result within the first hours of the onset of symptoms does not rule out myocardial infarction with certainty. If myocardial infarction is still  suspected, repeat the test at appropriate intervals.   Ethanol     Status: None   Collection Time: 01/24/19  8:31 AM  Result Value Ref Range   Alcohol, Ethyl (B) <10 <10 mg/dL    Comment: (NOTE) Lowest detectable limit for serum alcohol is 10 mg/dL. For medical purposes only. Performed at [facility], [place] [address] [place], [place] [address]   Ammonia     Status: None   Collection Time: 01/24/19  9:55 AM  Result Value Ref Range   Ammonia 27 9 - 35 umol/L    Comment: Performed at [facility], [place] [address], [place], [place] [address]  Phenytoin level, total     Status: Abnormal   Collection Time: 01/24/19  9:55 AM  Result Value Ref Range   Phenytoin Lvl <2.5 (L) 10.0 - 20.0 ug/mL    Comment: Performed at [facility] [address], [place], [place] [address]  I-STAT 7, (LYTES, BLD GAS, ICA, H+H)     Status: Abnormal   Collection Time: 01/24/19 10:44 AM  Result Value Ref Range   pH, Arterial 7.401 7.350 - 7.450   pCO2 arterial 34.2 32.0 - 48.0 mmHg   pO2, Arterial 70.0 (L) 83.0 - 108.0 mmHg   Bicarbonate 21.6 20.0 - 28.0 mmol/L   TCO2 23 22 - 32 mmol/L   O2 Saturation 95.0 %   Acid-base deficit 3.0 (H) 0.0 - 2.0 mmol/L   Sodium 137 135 - 145 mmol/L   Potassium 4.3 3.5 - 5.1 mmol/L   Calcium, Ion 1.26 1.15 - 1.40 mmol/L   HCT 39.0 39.0 - 52.0 %   Hemoglobin 13.3 13.0 - 17.0 g/dL   Patient temperature 95.8 F    Collection site RADIAL, ALLEN'S TEST ACCEPTABLE    Drawn by Operator    Sample type ARTERIAL     Current Facility-Administered Medications  Medication Dose Route Frequency Provider Last Rate Last Dose  . amitriptyline (ELAVIL) tablet 100 mg  100 mg Oral QHS Jolleen Seman, MD      . dextrose 5 %-0.9 % sodium chloride infusion   Intravenous Continuous Barb Merino, MD 100 mL/hr at 01/25/19 1134    . enoxaparin (LOVENOX) injection 40 mg  40 mg Subcutaneous Q24H Barb Merino, MD   40 mg at 01/24/19 1455  . lacosamide (VIMPAT) tablet 150  mg  150 mg Oral BID Barb Merino, MD   150 mg at  01/25/19 0827  . lamoTRIgine (LAMICTAL) tablet 50 mg  50 mg Oral Daily Barb Merino, MD   50 mg at 01/25/19 0828  . ondansetron (ZOFRAN) tablet 4 mg  4 mg Oral Q6H PRN Barb Merino, MD       Or  . ondansetron (ZOFRAN) injection 4 mg  4 mg Intravenous Q6H PRN Barb Merino, MD      . QUEtiapine (SEROQUEL) tablet 400 mg  400 mg Oral QHS Patricia Perales, MD        Musculoskeletal: Strength & Muscle Tone: not tested Gait & Station: not tested Patient leans: N/A  Psychiatric Specialty Exam: Physical Exam  Psychiatric: He has a normal mood and affect. His speech is normal and behavior is normal. Judgment and thought content normal. Cognition and memory are normal.    Review of Systems  Constitutional: Negative.   HENT: Negative.   Eyes: Negative.   Respiratory: Negative.   Cardiovascular: Negative.   Gastrointestinal: Negative.   Genitourinary: Negative.   Musculoskeletal: Positive for joint pain.  Skin: Negative.   Neurological: Negative.   Endo/Heme/Allergies: Negative.   Psychiatric/Behavioral: Negative.     Blood pressure (P) 139/76, pulse (P) 78, temperature (P) 98.4 F (36.9 C), temperature source (P) Oral, resp. rate (P) 18, SpO2 (P) 99 %.There is no height or weight on file to calculate BMI.  General Appearance: Casual  Eye Contact:  Good  Speech:  Clear and Coherent  Volume:  Normal  Mood:  Euthymic  Affect:  Appropriate  Thought Process:  Coherent  Orientation:  Full (Time, Place, and Person)  Thought Content:  Logical  Suicidal Thoughts:  No  Homicidal Thoughts:  No  Memory:  Immediate;   Good Recent;   Fair Remote;   Fair  Judgement:  Intact  Insight:  Fair  Psychomotor Activity:  Normal  Concentration:  Concentration: Fair and Attention Span: Fair  Recall:  Good  Fund of Knowledge:  Fair  Language:  Good  Akathisia:  No  Handed:  Right  AIMS (if indicated):     Assets:  Communication  Skills Desire for Improvement  ADL's:  Intact  Cognition:  WNL  Sleep:   fair     Treatment Plan Summary: Diagnosis: History of Schizoaffective disorder Medication management  Re-start Elavil 100 mg qhs for depression, Seroquel 400 mg at bedtime for schizoaffective disorder. Patient is cleared by psychiatric service, he should be referred to his outpatient psychiatrist when he is medically cleared. Psychiatric service signing out. Re-consult as needed.  Disposition: No evidence of imminent risk to self or others at present.   Patient does not meet criteria for psychiatric inpatient admission. Supportive therapy provided about ongoing stressors. Refer patient to his outpatient psychiatrist upon discharge  Corena Pilgrim, MD 01/25/2019 2:47 PM

## 2019-01-26 ENCOUNTER — Telehealth: Payer: Self-pay | Admitting: Family Medicine

## 2019-01-26 LAB — PHENYTOIN LEVEL, FREE AND TOTAL: Phenytoin, Total: UNDETERMINED ug/mL

## 2019-01-26 NOTE — Telephone Encounter (Signed)
Reviewed form for Home Heath Certification and placed in PCP's box for completion.  [name], [place]

## 2019-01-26 NOTE — Telephone Encounter (Signed)
Home Health Certification  form dropped off for at front desk for completion.  Verified that patient section of form has been completed.  Last DOS/WCC with PCP was 08/19/18 Placed form in team folder to be completed by clinical staff.  [name]

## 2019-01-30 NOTE — Telephone Encounter (Signed)
LM with answering service (no VM available) for Gordan Payment from [place] to call back.  Will inform of message from Dr. Ardelia Mems below. Tyvon Eggenberger, Salome Spotted, CMA

## 2019-01-30 NOTE — Telephone Encounter (Signed)
Unfortunately I continue to be unable to sign these forms as patient has not come for an appointment since September and I cannot sign something saying he has had a face to face visit recently. I previously personally called the [facility] and informed them of this, and asked them to have patient schedule a visit. We have also tried multiple times to contact the patient to have him schedule, but he still has not scheduled an appointment.   [facility] RN team, can you contact [facility] agency again and explain that patient must be seen before I can sign these forms?  Thanks, Leeanne Rio, MD

## 2019-02-03 LAB — URINE CULTURE: Culture: >=100000 — AB

## 2019-02-04 ENCOUNTER — Telehealth: Payer: Self-pay | Admitting: *Deleted

## 2019-02-04 NOTE — Telephone Encounter (Signed)
Post ED Visit - Positive Culture Follow-up  Culture report reviewed by antimicrobial stewardship pharmacist:  []  Elenor Quinones, Pharm.D. []  Heide Guile, Pharm.D., BCPS AQ-ID []  Parks Neptune, Pharm.D., BCPS []  Alycia Rossetti, Pharm.D., BCPS []  Keys, Pharm.D., BCPS, AAHIVP []  Legrand Como, Pharm.D., BCPS, AAHIVP []  Salome Arnt, PharmD, BCPS []  Johnnette Gourd, PharmD, BCPS [x]  Hughes Better, PharmD, BCPS []  [name], PharmD  Positive urine culture Treated with Cephalexin, organism sensitive to the same and no further patient follow-up is required at this time.  Harlon Flor [facility] 02/04/2019, 9:09 AM

## 2019-02-05 ENCOUNTER — Telehealth: Payer: Self-pay | Admitting: Family Medicine

## 2019-02-05 NOTE — Telephone Encounter (Signed)
I cannot complete this form until patient comes for an appointment. We have been trying for months to get him to come in. Please let this person know.  Leeanne Rio, MD

## 2019-02-05 NOTE — Telephone Encounter (Signed)
Millie from [facility] is calling to check on the status of the patients DMA form being completed and faxed back.   The best call back number with any questions is [phone].

## 2019-02-06 NOTE — Telephone Encounter (Signed)
Called and spoke with [facility]. Clarified that Face to Face from his hospitalization in December with Dr. Griffin Basil is adequate for me to sign these orders and will effectively meet medicaid requirements. Orders have been signed and faxed back. Patient has since been discharged from [facility] services. BJ appreciative.  Leeanne Rio, MD

## 2019-02-06 NOTE — Telephone Encounter (Signed)
Marcie Bal LMOVM again.   Spoke with Marcie Bal and informed of message from MD.  Marcie Bal states that the issue is that we were giving verbal orders.  She states that the forms are just written versions of those.    Advised I would send message to Dr. Ardelia Mems.  Marcie Bal states that if Dr. Ardelia Mems has additional questions she could call [facility] @ [phone] Ext: [phone]  Keslie Gritz, Salome Spotted, [place]

## 2019-02-06 NOTE — Telephone Encounter (Signed)
Called Peter Congo who identified herself as that owner of [employer] [phone].  Peter Congo states that she was not aware that the patient had not had an appointment in the last 90 days.  She will follow up with the therapist who visits him and encourage him to call and make an appointment.  Ozella Almond, [place]

## 2019-02-14 ENCOUNTER — Other Ambulatory Visit (HOSPITAL_COMMUNITY): Payer: Self-pay | Admitting: Psychiatry

## 2019-02-14 ENCOUNTER — Other Ambulatory Visit: Payer: Self-pay | Admitting: Family Medicine

## 2019-02-14 DIAGNOSIS — F250 Schizoaffective disorder, bipolar type: Secondary | ICD-10-CM

## 2019-02-14 DIAGNOSIS — F411 Generalized anxiety disorder: Secondary | ICD-10-CM

## 2019-02-18 ENCOUNTER — Ambulatory Visit (HOSPITAL_COMMUNITY): Payer: Medicaid Other | Admitting: Psychiatry

## 2019-02-20 ENCOUNTER — Other Ambulatory Visit: Payer: Self-pay | Admitting: Family Medicine

## 2019-02-20 ENCOUNTER — Other Ambulatory Visit: Payer: Self-pay | Admitting: Adult Health

## 2019-02-20 DIAGNOSIS — E785 Hyperlipidemia, unspecified: Secondary | ICD-10-CM

## 2019-02-23 ENCOUNTER — Other Ambulatory Visit: Payer: Self-pay

## 2019-03-09 ENCOUNTER — Other Ambulatory Visit: Payer: Self-pay | Admitting: Adult Health

## 2019-03-11 ENCOUNTER — Other Ambulatory Visit (HOSPITAL_COMMUNITY): Payer: Self-pay | Admitting: Psychiatry

## 2019-03-11 DIAGNOSIS — F250 Schizoaffective disorder, bipolar type: Secondary | ICD-10-CM

## 2019-03-11 DIAGNOSIS — F411 Generalized anxiety disorder: Secondary | ICD-10-CM

## 2019-03-11 MED ORDER — LAMOTRIGINE 25 MG PO TABS: 50.0000 mg | ORAL_TABLET | Freq: Every day | ORAL | 1 refills | Status: DC

## 2019-03-14 ENCOUNTER — Other Ambulatory Visit: Payer: Self-pay | Admitting: Family Medicine

## 2019-03-17 ENCOUNTER — Ambulatory Visit: Payer: Medicaid Other | Admitting: Physical Therapy

## 2019-03-18 ENCOUNTER — Other Ambulatory Visit: Payer: Self-pay

## 2019-03-18 ENCOUNTER — Ambulatory Visit (INDEPENDENT_AMBULATORY_CARE_PROVIDER_SITE_OTHER): Payer: Medicaid Other | Admitting: Psychiatry

## 2019-03-18 DIAGNOSIS — F250 Schizoaffective disorder, bipolar type: Secondary | ICD-10-CM

## 2019-03-18 DIAGNOSIS — F411 Generalized anxiety disorder: Secondary | ICD-10-CM | POA: Diagnosis not present

## 2019-03-18 MED ORDER — QUETIAPINE FUMARATE 300 MG PO TABS: 600.0000 mg | ORAL_TABLET | Freq: Every day | ORAL | 1 refills | Status: DC

## 2019-03-18 MED ORDER — AMITRIPTYLINE HCL 100 MG PO TABS: 200.0000 mg | ORAL_TABLET | Freq: Every day | ORAL | 1 refills | Status: DC

## 2019-03-18 MED ORDER — LAMOTRIGINE 25 MG PO TABS: 75.0000 mg | ORAL_TABLET | Freq: Every day | ORAL | 1 refills | Status: DC

## 2019-03-18 NOTE — Progress Notes (Signed)
Virtual Visit via Telephone Note  I connected with [name] on 03/18/19 at  3:40 PM EDT by telephone and verified that I am speaking with the correct person using two identifiers.   I discussed the limitations, risks, security and privacy concerns of performing an evaluation and management service by telephone and the availability of in person appointments. I also discussed with the patient that there may be a patient responsible charge related to this service. The patient expressed understanding and agreed to proceed.   History of Present Illness: Patient was evaluated through phone session.  He has been seen in the ER twice since the last visit because of accidental overdose.  He told that he is confused about taking his medication.  When I questioned about his medication he told that he is taking Lamictal only 1 tablet despite recommendation to take 2 tablet.  He is not sure why he forgot to take the Lamictal 2 tablet.  He also feels anxious and lately paranoid about the pandemic coronavirus situation.  He is sleeping on and off.  He admitted Lamictal 25 mg did help his mood irritability but he is having difficulty sleeping.  Despite taking higher dose of Seroquel and amitriptyline he reported sleeping only few hours.  He has a seizure disorder and I reviewed his blood work his Dilantin level is low.  He is taking Vimpat and he denies any seizures since the last visit.  Denies any hallucination but admitted chronic paranoia and worried.  He is letting a homeless woman staying with him who does help him a lot.  He also endorsed that he does not trust people but he has no choice to allow her to stay because he needs some assistance.  He denies any suicidal thoughts or homicidal thought.  He denies any aggression, violence.  He reported no side effects of the medication.  He does not leave the house unless it is important.  I review recent ER visits with him.  He had accidentally overdose on Ativan.  His  Ativan was discontinued.  He like to try higher dose of Lamictal which we have recommended in the past but he forgot.  He denies drinking or using any illegal substances.  Past Psychiatric History: Reviewed. H/O depression since college. H/O at least 15 hospitalization for depression, paranoia and suicidal thoughts. Last hospitalization in August 2016. At that time he was thinking to kill himself by inhaling helium. Discharged on Seroquel 900 mg and amitriptyline 400 mgat bedtime.H/O suicidal attempt to cut his testicle as does not want remain depressed. Seen at [name]. Tried multiple trial of SSRIs but reaction to SSRIs. Tried nortriptyline, Haldol, Geodon, Seroquel, lithium, Prozac, Wellbutrin, Paxil, Klonopin, Ativan, Lexapro and Zoloft. No H/O mania or any drug use.   Recent Results (from the past 2160 hour(s))  Urinalysis, Routine w reflex microscopic     Status: Abnormal   Collection Time: 01/13/19  1:44 PM  Result Value Ref Range   Color, Urine YELLOW YELLOW   APPearance HAZY (A) CLEAR   Specific Gravity, Urine 1.010 1.005 - 1.030   pH 7.0 5.0 - 8.0   Glucose, UA NEGATIVE NEGATIVE mg/dL   Hgb urine dipstick SMALL (A) NEGATIVE   Bilirubin Urine NEGATIVE NEGATIVE   Ketones, ur NEGATIVE NEGATIVE mg/dL   Protein, ur NEGATIVE NEGATIVE mg/dL   Nitrite POSITIVE (A) NEGATIVE   Leukocytes, UA LARGE (A) NEGATIVE   RBC / HPF 0-5 0 - 5 RBC/hpf  WBC, UA >50 (H) 0 - 5 WBC/hpf   Bacteria, UA MANY (A) NONE SEEN   Squamous Epithelial / LPF 0-5 0 - 5   WBC Clumps PRESENT    Mucus PRESENT     Comment: Performed at [facility], [place] [address] [place], [place] [address]  CBC with Differential     Status: None   Collection Time: 01/13/19  1:49 PM  Result Value Ref Range   WBC 7.9 4.0 - 10.5 K/uL   RBC 4.59 4.22 - 5.81 MIL/uL   Hemoglobin 15.0 13.0 - 17.0 g/dL   HCT 44.6 39.0 - 52.0 %   MCV 97.2 80.0 - 100.0 fL   MCH 32.7  26.0 - 34.0 pg   MCHC 33.6 30.0 - 36.0 g/dL   RDW 14.6 11.5 - 15.5 %   Platelets 188 150 - 400 K/uL   nRBC 0.0 0.0 - 0.2 %   Neutrophils Relative % 83 %   Neutro Abs 6.5 1.7 - 7.7 K/uL   Lymphocytes Relative 11 %   Lymphs Abs 0.8 0.7 - 4.0 K/uL   Monocytes Relative 5 %   Monocytes Absolute 0.4 0.1 - 1.0 K/uL   Eosinophils Relative 0 %   Eosinophils Absolute 0.0 0.0 - 0.5 K/uL   Basophils Relative 0 %   Basophils Absolute 0.0 0.0 - 0.1 K/uL   Immature Granulocytes 1 %   Abs Immature Granulocytes 0.05 0.00 - 0.07 K/uL    Comment: Performed at [facility], [place] [address], [place], [place] [address]  Comprehensive metabolic panel     Status: Abnormal   Collection Time: 01/13/19  1:49 PM  Result Value Ref Range   Sodium 135 135 - 145 mmol/L   Potassium 4.6 3.5 - 5.1 mmol/L   Chloride 104 98 - 111 mmol/L   CO2 24 22 - 32 mmol/L   Glucose, Bld 109 (H) 70 - 99 mg/dL   BUN 15 8 - 23 mg/dL   Creatinine, Ser 1.14 0.61 - 1.24 mg/dL   Calcium 8.7 (L) 8.9 - 10.3 mg/dL   Total Protein 7.1 6.5 - 8.1 g/dL   Albumin 4.2 3.5 - 5.0 g/dL   AST 35 15 - 41 U/L   ALT 15 0 - 44 U/L   Alkaline Phosphatase 122 38 - 126 U/L   Total Bilirubin 1.6 (H) 0.3 - 1.2 mg/dL   GFR calc non Af Amer >60 >60 mL/min   GFR calc Af Amer >60 >60 mL/min   Anion gap 7 5 - 15    Comment: Performed at [facility], [place] [address], [place], [place] [address]  Phenytoin level, free and total     Status: Abnormal   Collection Time: 01/13/19  1:49 PM  Result Value Ref Range   Phenytoin, Free None Detected 1.0 - 2.0 ug/mL    Comment: (NOTE)                                Detection Limit = 0.5 Performed At: [facility] [place], [place] [id number] Rush Farmer MD VZ:[id number]    Phenytoin, Total 0.9 (L) 10.0 - 20.0 ug/mL    Comment: (NOTE)                                Detection Limit =  0.8                          <  0.8 Indicates None Detected   Urine  culture     Status: Abnormal   Collection Time: 01/13/19  2:19 PM  Result Value Ref Range   Specimen Description      URINE, RANDOM Performed at [facility], [place] [address] [place], [place] [address]    Special Requests      NONE Performed at [facility], [place] [address], [place], [place] [address]    Culture (A)     >=100,000 COLONIES/mL ESCHERICHIA COLI SEE SEPARATE REPORT IN [facility] UNDER MISCELLANEOUS RESULTS FOR DOS 01/13/19. Performed at [employer] Performed at [facility], [place] [address], [place], [place] [address]    Report Status 02/03/2019 FINAL   Susceptibility, Aer + Anaerob     Status: None   Collection Time: 01/13/19  2:19 PM  Result Value Ref Range   Suscept, Aer + Anaerob Final report     Comment: (NOTE) Performed At: [facility] [address] [place], [place] [id number] Rush Farmer MD IN:[id number] CORRECTED ON 02/05 AT 0135: PREVIOUSLY REPORTED AS Preliminary report    Source of Sample URINE, RANDOM     Comment: ORGANISM ID IS E COLI Performed at [facility], [place] [address], [place], [place] [address]   Susceptibility Result     Status: None   Collection Time: 01/13/19  2:19 PM  Result Value Ref Range   Suscept Result 1 Escherichia coli     Comment: (NOTE) Identification performed by account, not confirmed by this laboratory. Cefazolin <=4 ug/mL Cefazolin with an MIC <=16 predicts susceptibility to the oral agents cefaclor, cefdinir, cefpodoxime, cefprozil, cefuroxime, cephalexin, and loracarbef when used for therapy of uncomplicated urinary tract infections due to E. coli, Klebsiella pneumoniae, and Proteus mirabilis.    Antimicrobial Suscept Comment     Comment: (NOTE)      ** S = Susceptible; I = Intermediate; R = Resistant **                   P = Positive; N = Negative            MICS are expressed in micrograms per mL   Antibiotic                 RSLT#1    RSLT#2    RSLT#3     RSLT#4 Amoxicillin/Clavulanic Acid    S Ampicillin                     S Cefepime                       S Ceftriaxone                    S Cefuroxime                     S Ciprofloxacin                  S Ertapenem                      S Gentamicin                     S Imipenem                       S Levofloxacin  S Meropenem                      S Nitrofurantoin                 R Piperacillin/Tazobactam        S Tetracycline                   R Tobramycin                     S Trimethoprim/Sulfa             S Performed At: [facility] [place], [place] [id number] Rush Farmer MD VF:[id number]   Comprehensive metabolic panel     Status: Abnormal   Collection Time: 01/24/19  8:20 AM  Result Value Ref Range   Sodium 138 135 - 145 mmol/L   Potassium 4.5 3.5 - 5.1 mmol/L   Chloride 107 98 - 111 mmol/L   CO2 20 (L) 22 - 32 mmol/L   Glucose, Bld 118 (H) 70 - 99 mg/dL   BUN 19 8 - 23 mg/dL   Creatinine, Ser 1.12 0.61 - 1.24 mg/dL   Calcium 9.1 8.9 - 10.3 mg/dL   Total Protein 6.4 (L) 6.5 - 8.1 g/dL   Albumin 3.8 3.5 - 5.0 g/dL   AST 27 15 - 41 U/L   ALT 15 0 - 44 U/L   Alkaline Phosphatase 124 38 - 126 U/L   Total Bilirubin 0.7 0.3 - 1.2 mg/dL   GFR calc non Af Amer >60 >60 mL/min   GFR calc Af Amer >60 >60 mL/min   Anion gap 11 5 - 15    Comment: Performed at [facility], [place] [address], [place], [place] [address]  CBC with Differential     Status: None   Collection Time: 01/24/19  8:20 AM  Result Value Ref Range   WBC 8.8 4.0 - 10.5 K/uL   RBC 4.37 4.22 - 5.81 MIL/uL   Hemoglobin 14.7 13.0 - 17.0 g/dL   HCT 42.2 39.0 - 52.0 %   MCV 96.6 80.0 - 100.0 fL   MCH 33.6 26.0 - 34.0 pg   MCHC 34.8 30.0 - 36.0 g/dL   RDW 13.5 11.5 - 15.5 %   Platelets 199 150 - 400 K/uL   nRBC 0.0 0.0 - 0.2 %   Neutrophils Relative % 73 %   Neutro Abs 6.4 1.7 - 7.7 K/uL   Lymphocytes Relative 16 %   Lymphs Abs 1.4 0.7 - 4.0 K/uL    Monocytes Relative 8 %   Monocytes Absolute 0.7 0.1 - 1.0 K/uL   Eosinophils Relative 2 %   Eosinophils Absolute 0.2 0.0 - 0.5 K/uL   Basophils Relative 1 %   Basophils Absolute 0.1 0.0 - 0.1 K/uL   Immature Granulocytes 0 %   Abs Immature Granulocytes 0.03 0.00 - 0.07 K/uL    Comment: Performed at [facility], [address] [place], [place] [address]  Urinalysis, Routine w reflex microscopic     Status: None   Collection Time: 01/24/19  8:20 AM  Result Value Ref Range   Color, Urine YELLOW YELLOW   APPearance CLEAR CLEAR   Specific Gravity, Urine 1.014 1.005 - 1.030   pH 6.0 5.0 - 8.0   Glucose, UA NEGATIVE NEGATIVE mg/dL   Hgb urine dipstick NEGATIVE NEGATIVE   Bilirubin Urine NEGATIVE NEGATIVE   Ketones, ur NEGATIVE  NEGATIVE mg/dL   Protein, ur NEGATIVE NEGATIVE mg/dL   Nitrite NEGATIVE NEGATIVE   Leukocytes, UA NEGATIVE NEGATIVE    Comment: Performed at [place] [address] [place], [place] [address]  Acetaminophen level     Status: Abnormal   Collection Time: 01/24/19  8:20 AM  Result Value Ref Range   Acetaminophen (Tylenol), Serum <10 (L) 10 - 30 ug/mL    Comment: (NOTE) Therapeutic concentrations vary significantly. A range of 10-30 ug/mL  may be an effective concentration for many patients. However, some  are best treated at concentrations outside of this range. Acetaminophen concentrations >150 ug/mL at 4 hours after ingestion  and >50 ug/mL at 12 hours after ingestion are often associated with  toxic reactions. Performed at [facility], [place] [address], [place], [place] [address]   Salicylate level     Status: None   Collection Time: 01/24/19  8:20 AM  Result Value Ref Range   Salicylate Lvl <3.0 2.8 - 30.0 mg/dL    Comment: Performed at [place] [address], [place], [place] [address]  Phenytoin level, free and total     Status: None   Collection Time: 01/24/19  8:22 AM  Result Value Ref Range   Phenytoin, Free NOT  PERFORMED     Comment: (NOTE) Test not performed Performed At: [facility] [place], [place] [id number] Rush Farmer MD KG:[id number]    Phenytoin, Total QUANTITY NOT SUFFICIENT, UNABLE TO PERFORM TEST ug/mL    Comment: (NOTE) Quantity was not sufficient for analysis.      Gaspar Bidding H notified 01/26/2019                                Detection Limit =  0.8                          <0.8 Indicates None Detected   Urine rapid drug screen (hosp performed)     Status: Abnormal   Collection Time: 01/24/19  8:22 AM  Result Value Ref Range   Opiates NONE DETECTED NONE DETECTED   Cocaine NONE DETECTED NONE DETECTED   Benzodiazepines POSITIVE (A) NONE DETECTED   Amphetamines NONE DETECTED NONE DETECTED   Tetrahydrocannabinol NONE DETECTED NONE DETECTED   Barbiturates NONE DETECTED NONE DETECTED    Comment: (NOTE) DRUG SCREEN FOR MEDICAL PURPOSES ONLY.  IF CONFIRMATION IS NEEDED FOR ANY PURPOSE, NOTIFY LAB WITHIN 5 DAYS. LOWEST DETECTABLE LIMITS FOR URINE DRUG SCREEN Drug Class                     Cutoff (ng/mL) Amphetamine and metabolites    1000 Barbiturate and metabolites    200 Benzodiazepine                 628 Tricyclics and metabolites     300 Opiates and metabolites        300 Cocaine and metabolites        300 THC                            50 Performed at [facility], [place] [address] [place], [place] [address]   I-stat troponin, ED     Status: None   Collection Time: 01/24/19  8:27 AM  Result Value Ref Range  Troponin i, poc 0.01 0.00 - 0.08 ng/mL   Comment 3            Comment: Due to the release kinetics of cTnI, a negative result within the first hours of the onset of symptoms does not rule out myocardial infarction with certainty. If myocardial infarction is still suspected, repeat the test at appropriate intervals.   Ethanol     Status: None   Collection Time: 01/24/19  8:31 AM  Result Value Ref Range   Alcohol, Ethyl (B) <10  <10 mg/dL    Comment: (NOTE) Lowest detectable limit for serum alcohol is 10 mg/dL. For medical purposes only. Performed at [facility], [place] [address] [place],  [address]   Ammonia     Status: None   Collection Time: 01/24/19  9:55 AM  Result Value Ref Range   Ammonia 27 9 - 35 umol/L    Comment: Performed at [facility], [place] [address], [place], [place] [address]  Phenytoin level, total     Status: Abnormal   Collection Time: 01/24/19  9:55 AM  Result Value Ref Range   Phenytoin Lvl <2.5 (L) 10.0 - 20.0 ug/mL    Comment: Performed at [place] [address] [place], [place] [address]  I-STAT 7, (LYTES, BLD GAS, ICA, H+H)     Status: Abnormal   Collection Time: 01/24/19 10:44 AM  Result Value Ref Range   pH, Arterial 7.401 7.350 - 7.450   pCO2 arterial 34.2 32.0 - 48.0 mmHg   pO2, Arterial 70.0 (L) 83.0 - 108.0 mmHg   Bicarbonate 21.6 20.0 - 28.0 mmol/L   TCO2 23 22 - 32 mmol/L   O2 Saturation 95.0 %   Acid-base deficit 3.0 (H) 0.0 - 2.0 mmol/L   Sodium 137 135 - 145 mmol/L   Potassium 4.3 3.5 - 5.1 mmol/L   Calcium, Ion 1.26 1.15 - 1.40 mmol/L   HCT 39.0 39.0 - 52.0 %   Hemoglobin 13.3 13.0 - 17.0 g/dL   Patient temperature 95.8 F    Collection site RADIAL, ALLEN'S TEST ACCEPTABLE    Drawn by Operator    Sample type ARTERIAL    Observations/Objective: Limited mental status examination done on the phone.  Patient appears anxious.  His speech is slow and at times rambling.  He endorsed paranoia and difficulty trusting people but denies any auditory or visual hallucination.  He denies any active or passive suicidal thoughts or homicidal thought.  His attention is distracted on the phone.  He is alert and oriented x3.  His fund of knowledge is average.  He has loose association but no flight of ideas.  His thought process is slow.  He reported no tremors shakes or any EPS.  His insight and judgment is fair to limited.  Assessment and  Plan: Schizoaffective disorder, bipolar type.  Generalized anxiety disorder.  I reviewed records from recent ER visits.  His U tox positive for benzodiazepine but no alcohol or any illegal drugs.  He had accidentally overdosed 7 to 8 tablets of Ativan.  We will discontinue lorazepam due to recent overdose on Ativan.  I again reminded that he need to take the Lamictal as prescribed.  He do not recall any rash or itching.  He actually felt Lamictal help his mood and he is only taking 25 mg.  I encouraged to take 50 mg as prescribed until he finished the bottle and the new prescription will be 75 mg daily.  I will continue Seroquel 600 mg at bedtime and amitriptyline 200 mg at bedtime.  I encourage to call us back if he has any question, concern or having worsening of the symptoms.  Reminded that Lamictal can cause bash and in that case he need to stop the medication immediately.  Follow-up in 6 weeks to 8 weeks.   Follow Up Instructions:    I discussed the assessment and treatment plan with the patient. The patient was provided an opportunity to ask questions and all were answered. The patient agreed with the plan and demonstrated an understanding of the instructions.   The patient was advised to call back or seek an in-person evaluation if the symptoms worsen or if the condition fails to improve as anticipated.  I provided 25 minutes of non-face-to-face time during this encounter.   Kathlee Nations, MD

## 2019-03-26 ENCOUNTER — Other Ambulatory Visit (HOSPITAL_COMMUNITY): Payer: Self-pay | Admitting: Psychiatry

## 2019-03-26 DIAGNOSIS — F411 Generalized anxiety disorder: Secondary | ICD-10-CM

## 2019-03-27 ENCOUNTER — Other Ambulatory Visit (HOSPITAL_COMMUNITY): Payer: Self-pay | Admitting: Psychiatry

## 2019-03-27 NOTE — Telephone Encounter (Signed)
It is discontinued

## 2019-03-30 ENCOUNTER — Telehealth (HOSPITAL_COMMUNITY): Payer: Self-pay

## 2019-03-30 NOTE — Telephone Encounter (Signed)
I returned patient's phone call.  I explained his Ativan is discontinued due to overdose and he understand.

## 2019-03-30 NOTE — Telephone Encounter (Signed)
Patient is calling about his Ativan, he said he went to the pharmacy and it was not there. I reviewed your note and advised patient that the Ativan had been discontinued. Patient states that this was never discussed with him and he would like a call back from you please.

## 2019-03-31 ENCOUNTER — Other Ambulatory Visit (HOSPITAL_COMMUNITY): Payer: Self-pay | Admitting: Psychiatry

## 2019-03-31 DIAGNOSIS — F250 Schizoaffective disorder, bipolar type: Secondary | ICD-10-CM

## 2019-03-31 DIAGNOSIS — F411 Generalized anxiety disorder: Secondary | ICD-10-CM

## 2019-04-09 ENCOUNTER — Other Ambulatory Visit: Payer: Self-pay | Admitting: Family Medicine

## 2019-04-17 ENCOUNTER — Other Ambulatory Visit (HOSPITAL_COMMUNITY): Payer: Self-pay | Admitting: Psychiatry

## 2019-04-17 DIAGNOSIS — F250 Schizoaffective disorder, bipolar type: Secondary | ICD-10-CM

## 2019-04-17 DIAGNOSIS — F411 Generalized anxiety disorder: Secondary | ICD-10-CM

## 2019-04-29 ENCOUNTER — Other Ambulatory Visit: Payer: Self-pay

## 2019-04-29 ENCOUNTER — Telehealth (INDEPENDENT_AMBULATORY_CARE_PROVIDER_SITE_OTHER): Payer: Medicaid Other | Admitting: Family Medicine

## 2019-04-29 DIAGNOSIS — I10 Essential (primary) hypertension: Secondary | ICD-10-CM | POA: Diagnosis not present

## 2019-04-29 DIAGNOSIS — E291 Testicular hypofunction: Secondary | ICD-10-CM

## 2019-04-29 DIAGNOSIS — G40909 Epilepsy, unspecified, not intractable, without status epilepticus: Secondary | ICD-10-CM

## 2019-04-29 DIAGNOSIS — F259 Schizoaffective disorder, unspecified: Secondary | ICD-10-CM

## 2019-04-29 DIAGNOSIS — E785 Hyperlipidemia, unspecified: Secondary | ICD-10-CM

## 2019-04-29 DIAGNOSIS — K5909 Other constipation: Secondary | ICD-10-CM

## 2019-04-29 NOTE — Assessment & Plan Note (Signed)
Well-controlled.  Continue current regimen. 

## 2019-04-29 NOTE — Assessment & Plan Note (Signed)
Continues to follow regularly with psychiatry

## 2019-04-29 NOTE — Assessment & Plan Note (Signed)
Blood pressure controlled based on home readings. Patient wants to try cutting out HCTZ if possible. Discussed goal of 140/90. He will stop HCTZ for a few days and check blood pressure daily x1 week. If over 140/90, he will call us and restart HCTZ. Follow up in 3 months for next visit - will likely check labs at that time (will weigh risk/benefit of patient coming in during [place] pandemic).

## 2019-04-29 NOTE — Assessment & Plan Note (Signed)
Compliant with atorvastatin. Due for lipids, but will hold on these for now in light of COVID pandemic - benefit of him coming in to check lipids likely outweighs risks. Reassess at follow up in 3 months.

## 2019-04-29 NOTE — Progress Notes (Signed)
[facility] Tele[facility]icine Visit  Patient consented to have virtual visit. Method of visit: Telephone  Encounter participants: Patient: [name] - located at home Provider: Chrisandra Netters - located at office Others (if applicable): n/a  Chief Complaint: routine f/u   HPI:  Hypertension - currently taking losartan 50mg  daily, HCTZ 25mg  daily, and metoprolol XL 200mg  daily. Checks blood pressure at home and gets 130s/80s. No chest pain or shortness of breath. He is wondering if he could back down to just taking 2 blood pressure medications, would like to try cutting out the HCTZ if possible.  Constipation - takes miralax as needed with good results.  Hyperlipidemia - taking atorvastatin 40mg  daily. Last lipids checked in April 2019.  Seizures - no longer on dilantin, only taking vimpat. Denies having any seizures. Followed by neurology.  Continues to see Dr. Adele Schilder regularly for his mood, and urology regularly for his hypogonadism.  ROS: per HPI  Pertinent PMHx: history of hyperlipidemia, overweight, seizure disorder  Exam:  Respiratory: Patient speaking normally in full sentences throughout the phone encounter, without any respiratory distress evident.   Assessment/Plan:  Seizure disorder ([place]) Currently just on vimpat, denies seizures. Continue to follow up with neuro.  Androgen deficiency Continue to follow up with urology - they are managing his testosterone  Constipation, chronic Well controlled. Continue current regimen.   Schizoaffective disorder ([place]) Continues to follow regularly with psychiatry  Essential hypertension Blood pressure controlled based on home readings. Patient wants to try cutting out HCTZ if possible. Discussed goal of 140/90. He will stop HCTZ for a few days and check blood pressure daily x1 week. If over 140/90, he will call us and restart HCTZ. Follow up in 3 months for next visit - will likely check labs at that  time (will weigh risk/benefit of patient coming in during [place] pandemic).  Hyperlipidemia Compliant with atorvastatin. Due for lipids, but will hold on these for now in light of COVID pandemic - benefit of him coming in to check lipids likely outweighs risks. Reassess at follow up in 3 months.    Time spent during visit with patient: 12 minutes

## 2019-04-29 NOTE — Assessment & Plan Note (Signed)
Continue to follow up with urology - they are managing his testosterone

## 2019-04-29 NOTE — Assessment & Plan Note (Signed)
Currently just on vimpat, denies seizures. Continue to follow up with neuro.

## 2019-05-06 ENCOUNTER — Other Ambulatory Visit: Payer: Self-pay

## 2019-05-06 ENCOUNTER — Telehealth: Payer: Medicaid Other | Admitting: Family Medicine

## 2019-05-06 ENCOUNTER — Other Ambulatory Visit: Payer: Self-pay | Admitting: Family Medicine

## 2019-05-06 NOTE — Progress Notes (Signed)
11:40 AM - called both patient's listed number and the # that was put into appointment notes. No answer on either number x2. Left voicemails on both numbers offering for patient to call back.

## 2019-05-12 ENCOUNTER — Other Ambulatory Visit (HOSPITAL_COMMUNITY): Payer: Self-pay | Admitting: Psychiatry

## 2019-05-13 ENCOUNTER — Encounter: Payer: Self-pay | Admitting: Family Medicine

## 2019-05-13 ENCOUNTER — Telehealth (INDEPENDENT_AMBULATORY_CARE_PROVIDER_SITE_OTHER): Payer: Medicaid Other | Admitting: Family Medicine

## 2019-05-13 ENCOUNTER — Other Ambulatory Visit: Payer: Self-pay

## 2019-05-13 ENCOUNTER — Ambulatory Visit
Admission: RE | Admit: 2019-05-13 | Discharge: 2019-05-13 | Disposition: A | Discharge status: Home / Self Care | Payer: Medicaid Other | Source: Ambulatory Visit | Attending: Family Medicine | Admitting: Family Medicine

## 2019-05-13 ENCOUNTER — Telehealth: Payer: Self-pay | Admitting: *Deleted

## 2019-05-13 DIAGNOSIS — K5900 Constipation, unspecified: Secondary | ICD-10-CM | POA: Diagnosis not present

## 2019-05-13 NOTE — Progress Notes (Signed)
[facility] Telemedicine Visit  Patient consented to have virtual visit. Method of visit: Video was attempted, but technology challenges prevented patient from using video, so visit was conducted via telephone.  Encounter participants: Patient: [name] - located at home Provider: Annabell Sabal - located at office Others (if applicable): n/a   Chief Complaint: constipation  HPI:  Patient with prior history of small bowel obstructions in the past who has been complaining of constipation for the past 5 days.  He has been having normal flatus.  He did have a small bowel movement yesterday.  2 days ago he had what he describes as projectile vomiting.  Occurred 3 times.  He has not had any further nausea or vomiting since that point.  He has been decreasing his oral intake to try to help with constipation.  He has been taking Dulcolax last night and this morning which is the first things he has tried without any relief.  He is taking his daily MiraLAX and has been so for the past several months.  His abdomen has not painful.  He has been pressing on it and describes it is nontender.  He does not feel bloated.  No dysuria.  No fevers or chills.  He otherwise feels well but just wants to have a bowel movement.  ROS: per HPI  Pertinent PMHx: history of small bowel obstructions in past   Exam: Gen:  Patient awake, alert, fully conversant, oriented x 4 Auditory:  Hearing normal Resp:  Good strong voice.  Speaking in full sentences.  No coughing during exam.  No respiratory distress.  No audible wheezing over the phone  Psych:  Linear and coherent thought process.  No flight of ideas.    Assessment/Plan:  1.  Constipation: -Obvious and concern for partial small bowel obstruction.  Patient has been passing flatus but I am concerned about the fact he had vomiting several days ago. -Fortunately he has not had any yesterday or today.  The first time he attempted Dulcolax  was last night and this morning. -Patient was resistant by going to the emergency department to be evaluated. -He is not in any acute distress currently. -Plan is to send him for KUB today.  If partial SBO present patient agreeable to go to the emergency department at that point. -Recommended fleets enema if obstruction not seen on KUB.  I will call patient with results of KUB.  Time spent on phone with patient: 15 minutes

## 2019-05-13 NOTE — Telephone Encounter (Signed)
Due to current COVID 19 pandemic, our office is severely reducing in office visits until further notice, in order to minimize the risk to our patients and healthcare providers. Unable to get in contact with the patient to convert their office visit with [facility] on 05/13/2019 into a doxy.me visit. I left a voicemail asking the patient to return my call. Office number was provided.     If patient calls back please convert their office visit into a doxy.me visit.

## 2019-05-14 ENCOUNTER — Telehealth: Payer: Self-pay | Admitting: Family Medicine

## 2019-05-14 NOTE — Telephone Encounter (Signed)
Called and left voicemail.  KUB does not show obstruction which is good news.  Does show large amount of constipation.  Recommended patient to continue with fleets enema as recommended yesterday.  Also to continue with laxatives and daily MiraLAX.  He is to call back with any questions.    Also asked that he call back to this afternoon or tomorrow to update [place] with how he is doing.  May need another virtual visit but at least needs to let us know how he is doing.

## 2019-05-18 ENCOUNTER — Ambulatory Visit: Payer: Medicaid Other | Admitting: Adult Health

## 2019-05-18 ENCOUNTER — Other Ambulatory Visit: Payer: Self-pay

## 2019-05-18 ENCOUNTER — Ambulatory Visit: Payer: Medicaid Other | Admitting: Neurology

## 2019-05-18 NOTE — Telephone Encounter (Signed)
Pt returned call.  Relayed message from Dr. Mingo Amber. He states that when he came back home from the xray the "dam broke and I feel much better".  States he will call back if needed.  Christen Bame, CMA

## 2019-05-19 ENCOUNTER — Other Ambulatory Visit: Payer: Self-pay

## 2019-05-19 ENCOUNTER — Ambulatory Visit (INDEPENDENT_AMBULATORY_CARE_PROVIDER_SITE_OTHER): Payer: Medicaid Other | Admitting: Psychiatry

## 2019-05-19 ENCOUNTER — Encounter (HOSPITAL_COMMUNITY): Payer: Self-pay | Admitting: Psychiatry

## 2019-05-19 DIAGNOSIS — F250 Schizoaffective disorder, bipolar type: Secondary | ICD-10-CM

## 2019-05-19 DIAGNOSIS — F411 Generalized anxiety disorder: Secondary | ICD-10-CM

## 2019-05-19 MED ORDER — QUETIAPINE FUMARATE 300 MG PO TABS: 600.0000 mg | ORAL_TABLET | Freq: Every day | ORAL | 1 refills | Status: DC

## 2019-05-19 MED ORDER — LAMOTRIGINE 100 MG PO TABS: 100.0000 mg | ORAL_TABLET | Freq: Every day | ORAL | 1 refills | Status: DC

## 2019-05-19 MED ORDER — AMITRIPTYLINE HCL 100 MG PO TABS: 200.0000 mg | ORAL_TABLET | Freq: Every day | ORAL | 1 refills | Status: DC

## 2019-05-19 MED ORDER — ZOLPIDEM TARTRATE 5 MG PO TABS: 5.0000 mg | ORAL_TABLET | Freq: Every evening | ORAL | 1 refills | Status: DC | PRN

## 2019-05-19 NOTE — Progress Notes (Signed)
Virtual Visit via Telephone Note  I connected with [name] on 05/19/19 at  3:20 PM EDT by telephone and verified that I am speaking with the correct person using two identifiers.   I discussed the limitations, risks, security and privacy concerns of performing an evaluation and management service by telephone and the availability of in person appointments. I also discussed with the patient that there may be a patient responsible charge related to this service. The patient expressed understanding and agreed to proceed.   History of Present Illness: Patient was evaluated by phone session.  She is taking his medication but again forgot to take Lamictal 3 tablet daily.  He is only taking 2 tablet.  He admitted sometimes he is forgetful.  He like Lamictal and so far he is tolerating well and reported no shakes, rash or any itching.  He admitted increased anxiety especially at night when he cannot sleep well.  He has chronic paranoia and delusions that people are watching him and he does not like leaving his house because he gets very anxious around strangers.  He is also concerned about COVID-19.  He is taking Seroquel and amitriptyline but despite taking these medication he only sleeps a few hours.  He is scared to stop the amitriptyline because without amitriptyline he cannot even go to sleep.  He is pleased that he has no more seizures since the last visit.  He is letting a homeless woman with him who helps him a lot but also he does not trust people around him.  Patient told he has no choice to let her stay because he needs some assistance.  He denies any suicidal thoughts or homicidal thought.  He feels anxious but denies any crying spells or any feeling of hopelessness or worthlessness.  He denies drinking or using any illegal substances.  His appetite is okay.  He endorses weight is stable.  His energy level is okay.    Past Psychiatric History:Reviewed. H/Odepression since college.H/Oat  least 15 hospitalizationfordepression, paranoia and suicidal thoughts. Last hospitalization in August 2016. At that time he was thinking to kill himself by inhaling helium. Discharged on Seroquel 900 mg and amitriptyline 400 mgat bedtime.H/Osuicidal attempttocut his testicleasdoes not wantremaindepressed. Seen [place] [facility] and [name]. Triedmultiple trial of SSRIsbutreaction to SSRI. Triednortriptyline, Haldol, Geodon, Seroquel, lithium, Prozac, Wellbutrin, Paxil,Klonopin,Ativan, Lexapro and Zoloft. No H/Omania or any drug use.    Psychiatric Specialty Exam: Physical Exam  ROS  There were no vitals taken for this visit.There is no height or weight on file to calculate BMI.  General Appearance: NA  Eye Contact:  NA  Speech:  Slow  Volume:  Decreased  Mood:  Dysphoric  Affect:  NA  Thought Process:  Descriptions of Associations: Intact  Orientation:  Full (Time, Place, and Person)  Thought Content:  Paranoid Ideation and Rumination  Suicidal Thoughts:  No  Homicidal Thoughts:  No  Memory:  Immediate;   Fair Recent;   Fair Remote;   Fair  Judgement:  Fair  Insight:  Fair  Psychomotor Activity:  NA  Concentration:  Concentration: Fair and Attention Span: Fair  Recall:  [employer] of Knowledge:  NA  Language:  Good  Akathisia:  No  Handed:  Right  AIMS (if indicated):     Assets:  Communication Skills Desire for Improvement Housing  ADL's:  Intact  Cognition:  Impaired,  Mild  Sleep:   fair      Assessment and Plan: Schizoaffective  disorder, bipolar type.  Generalized anxiety disorder.  One more time I discussed the medication and compliance.  Encouraged to take the Lamictal as prescribed.  I encouraged to take the Lamictal 75 mg daily and once he finished then he should take 1 pill of 100 mg daily.  I reminded that if he developed rash that he need to call us and stop the medicine immediately.  Like to have something to  help his sleep.  He remember his mother used to take Ambien with good response.  In the past we had prescribed Ativan but he overdose and we discontinued.  We will try low-dose Ambien to help his sleep however I discussed some time Ambien because memory impairment, sleepwalking and eating while in sleeping.  Patient promised that he will call us back if he has any concerns or side effects.  I will continue Seroquel 600 mg at bedtime, amitriptyline 200 mg at bedtime and we will start Lamictal now 100 mg daily and add low-dose Ambien 5 mg to help his insomnia.  Recommended to call us back if is any question or any concern.  Follow-up in 2 months.  Follow Up Instructions:    I discussed the assessment and treatment plan with the patient. The patient was provided an opportunity to ask questions and all were answered. The patient agreed with the plan and demonstrated an understanding of the instructions.   The patient was advised to call back or seek an in-person evaluation if the symptoms worsen or if the condition fails to improve as anticipated.  I provided 15 minutes of non-face-to-face time during this encounter.   Kathlee Nations, MD

## 2019-05-26 NOTE — Telephone Encounter (Signed)
Noted  

## 2019-05-26 NOTE — Telephone Encounter (Signed)
05-26-19 Pt called, he gave verbal consent to file insurance for [email or web address] vv cell phone and carrier confirmed  Pt understands that although there may be some limitations with this type of visit, we will take all precautions to reduce any security or privacy concerns.  Pt understands that this will be treated like an in office visit and we will file with pt's insurance, and there may be a patient responsible charge related to this service. *text sent to cell phone [phone], carrier [employer] confirmed the text was received*

## 2019-05-27 ENCOUNTER — Encounter: Payer: Self-pay | Admitting: Neurology

## 2019-05-27 ENCOUNTER — Telehealth: Payer: Self-pay | Admitting: Neurology

## 2019-05-27 ENCOUNTER — Other Ambulatory Visit: Payer: Self-pay

## 2019-05-27 ENCOUNTER — Ambulatory Visit (INDEPENDENT_AMBULATORY_CARE_PROVIDER_SITE_OTHER): Payer: Medicaid Other | Admitting: Neurology

## 2019-05-27 DIAGNOSIS — G40909 Epilepsy, unspecified, not intractable, without status epilepticus: Secondary | ICD-10-CM

## 2019-05-27 NOTE — Telephone Encounter (Signed)
Pt scheduled for 09/08/19 at 2 pm check in time of 1:30 pm for Dr. [name].

## 2019-05-27 NOTE — Progress Notes (Signed)
Virtual Visit via Video Note  I connected with [name] on 05/27/19 at  1:15 PM EDT by a video enabled telemedicine application and verified that I am speaking with the correct person using two identifiers.  Location: Patient: At his home Provider: In the office    I discussed the limitations of evaluation and management by telemedicine and the availability of in person appointments. The patient expressed understanding and agreed to proceed.  History of Present Illness: 05/27/2019 SS: [name] is a 63 year old man with history of schizoaffective disorder and history of seizures.  He indicates that he has been doing well.  He reports that his memory has quite improved with the discontinuation of Klonopin.  He reports he has not had any seizure events.  He tells me he is no longer taking Dilantin, but he is taking Vimpat for seizure control.  He currently lives with his significant other, and manages his own medications.  He is not currently driving a car.  He remains under the care of psychiatry, is taking Lamictal, Seroquel, amitriptyline, Ambien as needed for insomnia.  He had a hospital admission in February 2020 after he took too much of his medication, Ativan.   On 01/13/2019 Apparently he went to the ED after a reported seizure while sitting in the recliner.  EMS reports that his behavior was erratic, agitated, enough to give 5 mg of Versed.  Upon arrival to the ED, he had returned to baseline.  Apparently he had taken himself off Dilantin without medical consultation. Dilantin level in December 2019, January 2020, February 2020 was 0.   07/10/2018 Dr. [name]: [name] is a 63 year old left-handed white male with a history of a schizoaffective disorder, and history of seizures.  The patient has not had any seizures in at least 3 or 3-1/2 years.  The patient had a flurry of seizures at that time and he believes that since that time he has had some decline in memory.  His wife believes  that his problems with memory have worsened over the last 4 months.  He was evaluated for memory disorder in November 2018, blood work and MRI of the brain was done at that time.  The patient had reported at that time that he was having intermittent episodes of being confused and staggery, it was felt that it was related to overuse of clonazepam.  The patient has cut back on clonazepam but when he does take it he gets slightly confused and staggery even now.  The patient is on very high dose amitriptyline at 200 mg at night, he takes Seroquel, Dilantin, and Vimpat, all of these medications may have cognitive side effects.  The wife reports that he has difficulty making logical decisions at times, he does not follow through with performing tasks, he does have some generalized short-term memory problems.  He has been under a lot of stress recently which has worsened his cognitive functioning.  His Mini-Mental status examination score today is better now than it was in November 2018   Observations/Objective: Alert, answers questions, poor historian, facial symmetry noted, trouble concentrating/hearing the video visit, gait is intact, moderate paced  Assessment and Plan: 1.  Reported memory disturbance 2.  Schizoaffective disorder 3.  History of seizures  I had a difficult time getting accurate history and doing virtual visit. He indicates his memory has improved, with the discontinuation of Klonopin.  I had a difficult time getting a history from [name].  He indicates he  has not had a seizure in 4 years, however it appears that he went to the hospital in January 2020 with a reported seizure event.  Our records indicate his Dilantin was refilled in May 2019, but he reports he has been off Dilantin for 4 years.  He had a Dilantin level checked while in the hospital in January, and the level was 0.  He reports he continues on Vimpat. At his last office visit in May 2019, his Dilantin level was 12.8.  I have  discussed with him obtaining a neuropsychological evaluation, to check for organic dementia.  He reports he is not willing to undergo this at this time, because he feels his memory issues have cleared with the discontinuation of Klonopin.  He currently does not drive a car. I was unable to conduct the [place]ind, but he was oriented to date, day, month, year, place, city.  I am going to check lab work to ensure he is truly taking Vimpat. He remains off Dilantin at this time. He will come back in 3 months to see Dr. [name] and re-evaluate if he needs to restart Dilantin. He will let me know if he has recurrent seizure.    Follow Up Instructions: 3 months with Dr. [name]   I discussed the assessment and treatment plan with the patient. The patient was provided an opportunity to ask questions and all were answered. The patient agreed with the plan and demonstrated an understanding of the instructions.   The patient was advised to call back or seek an in-person evaluation if the symptoms worsen or if the condition fails to improve as anticipated.  I provided 15 minutes of non-face-to-face time during this encounter.   Evangeline Dakin, DNP  [facility] [address], [place], [place] [address] [phone]

## 2019-05-27 NOTE — Telephone Encounter (Signed)
Will you get this patient set up for revisit with Dr. [name] in 3 months?

## 2019-05-27 NOTE — Progress Notes (Signed)
I have read the note, and I agree with the clinical assessment and plan.  Sueo Cullen K Kerin Cecchi   

## 2019-05-30 ENCOUNTER — Other Ambulatory Visit: Payer: Self-pay | Admitting: Family Medicine

## 2019-05-30 DIAGNOSIS — E785 Hyperlipidemia, unspecified: Secondary | ICD-10-CM

## 2019-06-01 NOTE — Telephone Encounter (Signed)
Please contact him and schedule an in person or tele visit Thanks LC 

## 2019-06-10 ENCOUNTER — Other Ambulatory Visit (HOSPITAL_COMMUNITY): Payer: Self-pay | Admitting: Psychiatry

## 2019-06-10 DIAGNOSIS — F411 Generalized anxiety disorder: Secondary | ICD-10-CM

## 2019-06-10 DIAGNOSIS — F250 Schizoaffective disorder, bipolar type: Secondary | ICD-10-CM

## 2019-06-16 ENCOUNTER — Other Ambulatory Visit: Payer: Self-pay

## 2019-06-16 ENCOUNTER — Other Ambulatory Visit (INDEPENDENT_AMBULATORY_CARE_PROVIDER_SITE_OTHER): Payer: Self-pay

## 2019-06-16 DIAGNOSIS — Z0289 Encounter for other administrative examinations: Secondary | ICD-10-CM

## 2019-06-16 DIAGNOSIS — G40909 Epilepsy, unspecified, not intractable, without status epilepticus: Secondary | ICD-10-CM

## 2019-06-18 ENCOUNTER — Telehealth: Payer: Self-pay | Admitting: *Deleted

## 2019-06-18 LAB — COMPREHENSIVE METABOLIC PANEL
ALT: 18 IU/L (ref 0–44)
AST: 15 IU/L (ref 0–40)
Albumin/Globulin Ratio: 2.2 (ref 1.2–2.2)
Albumin: 4.4 g/dL (ref 3.8–4.8)
Alkaline Phosphatase: 104 IU/L (ref 39–117)
BUN/Creatinine Ratio: 16 (ref 10–24)
BUN: 18 mg/dL (ref 8–27)
Bilirubin Total: 0.3 mg/dL (ref 0.0–1.2)
CO2: 18 mmol/L — ABNORMAL LOW (ref 20–29)
Calcium: 9.4 mg/dL (ref 8.6–10.2)
Chloride: 104 mmol/L (ref 96–106)
Creatinine, Ser: 1.16 mg/dL (ref 0.76–1.27)
GFR calc Af Amer: 77 mL/min/{1.73_m2} (ref >59)
GFR calc non Af Amer: 66 mL/min/{1.73_m2} (ref >59)
Globulin, Total: 2 g/dL (ref 1.5–4.5)
Glucose: 110 mg/dL — ABNORMAL HIGH (ref 65–99)
Potassium: 4.1 mmol/L (ref 3.5–5.2)
Sodium: 140 mmol/L (ref 134–144)
Total Protein: 6.4 g/dL (ref 6.0–8.5)

## 2019-06-18 LAB — CBC WITH DIFFERENTIAL/PLATELET
Basophils Absolute: 0.1 10*3/uL (ref 0.0–0.2)
Basos: 1 %
EOS (ABSOLUTE): 0.3 10*3/uL (ref 0.0–0.4)
Eos: 3 %
Hematocrit: 50.8 % (ref 37.5–51.0)
Hemoglobin: 17.2 g/dL (ref 13.0–17.7)
Immature Grans (Abs): 0 10*3/uL (ref 0.0–0.1)
Immature Granulocytes: 0 %
Lymphocytes Absolute: 2.4 10*3/uL (ref 0.7–3.1)
Lymphs: 27 %
MCH: 31.3 pg (ref 26.6–33.0)
MCHC: 33.9 g/dL (ref 31.5–35.7)
MCV: 92 fL (ref 79–97)
Monocytes Absolute: 0.6 10*3/uL (ref 0.1–0.9)
Monocytes: 7 %
Neutrophils Absolute: 5.4 10*3/uL (ref 1.4–7.0)
Neutrophils: 62 %
Platelets: 192 10*3/uL (ref 150–450)
RBC: 5.5 x10E6/uL (ref 4.14–5.80)
RDW: 14.8 % (ref 11.6–15.4)
WBC: 8.7 10*3/uL (ref 3.4–10.8)

## 2019-06-18 LAB — LACOSAMIDE: Lacosamide: 5.9 ug/mL (ref 5.0–10.0)

## 2019-06-18 NOTE — Telephone Encounter (Signed)
-----   Message from Suzzanne Cloud, NP sent at 06/18/2019  7:56 AM EDT ----- Please call the patient. Labs are okay. It appears he is taking Vimpat.

## 2019-06-18 NOTE — Telephone Encounter (Signed)
LMVM home # that lab results back and per SS/NP are ok, vimpat level in therapeutic range.  Please call back if questions.

## 2019-06-29 ENCOUNTER — Other Ambulatory Visit: Payer: Self-pay | Admitting: *Deleted

## 2019-06-29 DIAGNOSIS — I10 Essential (primary) hypertension: Secondary | ICD-10-CM

## 2019-06-30 NOTE — Telephone Encounter (Signed)
Red team, can you check with patient on whether he is actually taking this medication? He previously told me he wanted to stop taking it Thanks Leeanne Rio, MD

## 2019-07-01 ENCOUNTER — Other Ambulatory Visit: Payer: Self-pay | Admitting: Family Medicine

## 2019-07-01 DIAGNOSIS — I10 Essential (primary) hypertension: Secondary | ICD-10-CM

## 2019-07-01 NOTE — Telephone Encounter (Signed)
Spoke with pt and he states he is still taking it. Kenedee Molesky Kennon Holter, CMA

## 2019-07-03 MED ORDER — HYDROCHLOROTHIAZIDE 25 MG PO TABS: 25.0000 mg | ORAL_TABLET | Freq: Every day | ORAL | 0 refills | Status: DC

## 2019-07-20 ENCOUNTER — Ambulatory Visit (INDEPENDENT_AMBULATORY_CARE_PROVIDER_SITE_OTHER): Payer: Medicaid Other | Admitting: Psychiatry

## 2019-07-20 ENCOUNTER — Encounter (HOSPITAL_COMMUNITY): Payer: Self-pay | Admitting: Psychiatry

## 2019-07-20 ENCOUNTER — Other Ambulatory Visit: Payer: Self-pay

## 2019-07-20 DIAGNOSIS — F411 Generalized anxiety disorder: Secondary | ICD-10-CM

## 2019-07-20 DIAGNOSIS — F250 Schizoaffective disorder, bipolar type: Secondary | ICD-10-CM | POA: Diagnosis not present

## 2019-07-20 MED ORDER — QUETIAPINE FUMARATE 300 MG PO TABS: 600.0000 mg | ORAL_TABLET | Freq: Every day | ORAL | 2 refills | Status: DC

## 2019-07-20 MED ORDER — ZOLPIDEM TARTRATE 5 MG PO TABS: 5.0000 mg | ORAL_TABLET | Freq: Every evening | ORAL | 2 refills | Status: DC | PRN

## 2019-07-20 MED ORDER — AMITRIPTYLINE HCL 100 MG PO TABS: 200.0000 mg | ORAL_TABLET | Freq: Every day | ORAL | 2 refills | Status: DC

## 2019-07-20 NOTE — Progress Notes (Signed)
Virtual Visit via Telephone Note  I connected with [name] on 07/20/19 at  3:00 PM EDT by telephone and verified that I am speaking with the correct person using two identifiers.   I discussed the limitations, risks, security and privacy concerns of performing an evaluation and management service by telephone and the availability of in person appointments. I also discussed with the patient that there may be a patient responsible charge related to this service. The patient expressed understanding and agreed to proceed.   History of Present Illness: Patient was evaluated by phone session.  He is no longer taking Lamictal because he felt it is making him more irritable.  He is sleeping better since we added low-dose Ambien which he takes as needed.  He still have chronic paranoia and delusion that people watching him but he does not act on it.  He feels anxious and stays most of the time at home and does not leave unless it is important.  He is letting a homeless woman with him who helps him a lot and he feel it is important for him since he does not go outside to do things.  He denies any crying spells or any feeling of hopelessness or worthlessness.  Is tolerating amitriptyline and Seroquel and low-dose Ambien.  He does not want to continue Lamictal.  His appetite is okay.  Energy level is okay.  He reported his weight is stable.   Past Psychiatric History:Reviewed. H/Odepression since college.H/Oat least 15 hospitalizationfordepression, paranoia and suicidal thoughts. Last hospitalization in August 2016. At that time he was thinking to kill himself by inhaling helium. Discharged on Seroquel 900 mg and amitriptyline 400 mgat bedtime.H/Osuicidal attempttocut his testicleasdoes not wantremaindepressed. Seen [place] [facility] and [name]. Triedmultiple trial of SSRIsbutreaction to SSRI. Triednortriptyline, Haldol, Geodon, Seroquel, lithium, Prozac,  Wellbutrin, Paxil,Klonopin,Ativan, Lexapro, Zoloft and recently Lamictal. No H/Omania or any drug use.    Psychiatric Specialty Exam: Physical Exam  ROS  There were no vitals taken for this visit.There is no height or weight on file to calculate BMI.  General Appearance: NA  Eye Contact:  NA  Speech:  Slow  Volume:  Decreased  Mood:  Anxious  Affect:  NA  Thought Process:  Goal Directed  Orientation:  Full (Time, Place, and Person)  Thought Content:  Paranoid Ideation and Rumination  Suicidal Thoughts:  No  Homicidal Thoughts:  No  Memory:  Immediate;   Fair Recent;   Fair Remote;   Fair  Judgement:  Fair  Insight:  Fair  Psychomotor Activity:  NA  Concentration:  Concentration: Fair and Attention Span: Fair  Recall:  [employer] of Knowledge:  Good  Language:  Good  Akathisia:  No  Handed:  Right  AIMS (if indicated):     Assets:  Communication Skills Desire for Improvement Housing  ADL's:  Intact  Cognition:  Impaired,  Mild  Sleep:   better      Assessment and Plan: Schizoaffective disorder, bipolar type.  Generalized anxiety disorder.  Discontinue Lamictal since patient is no longer taking it.  He feels he does not need it since he is sleeping better and does not have any mood swings or anger.  Continue amitriptyline 200 mg at bedtime, Seroquel 600 mg at bedtime and Ambien 5 mg as needed for insomnia.  Discussed medication side effects and benefits.  He is not interested in therapy.  Recommended to call us back if is any question or any concern.  Follow-up in 3 months.  Follow Up Instructions:    I discussed the assessment and treatment plan with the patient. The patient was provided an opportunity to ask questions and all were answered. The patient agreed with the plan and demonstrated an understanding of the instructions.   The patient was advised to call back or seek an in-person evaluation if the symptoms worsen or if the condition fails to improve as  anticipated.  I provided 20 minutes of non-face-to-face time during this encounter.   Kathlee Nations, MD

## 2019-07-23 ENCOUNTER — Other Ambulatory Visit (HOSPITAL_COMMUNITY): Payer: Self-pay | Admitting: Psychiatry

## 2019-07-23 DIAGNOSIS — F250 Schizoaffective disorder, bipolar type: Secondary | ICD-10-CM

## 2019-07-23 DIAGNOSIS — F411 Generalized anxiety disorder: Secondary | ICD-10-CM

## 2019-07-28 NOTE — Telephone Encounter (Signed)
On last visit he mentioned that he is not taking Lamictal.

## 2019-08-07 ENCOUNTER — Other Ambulatory Visit (HOSPITAL_COMMUNITY): Payer: Self-pay | Admitting: Psychiatry

## 2019-08-07 ENCOUNTER — Other Ambulatory Visit: Payer: Self-pay | Admitting: Family Medicine

## 2019-08-07 DIAGNOSIS — F411 Generalized anxiety disorder: Secondary | ICD-10-CM

## 2019-08-07 DIAGNOSIS — F250 Schizoaffective disorder, bipolar type: Secondary | ICD-10-CM

## 2019-09-08 ENCOUNTER — Encounter: Payer: Self-pay | Admitting: Neurology

## 2019-09-08 ENCOUNTER — Ambulatory Visit: Payer: Self-pay | Admitting: Neurology

## 2019-09-08 ENCOUNTER — Telehealth: Payer: Self-pay | Admitting: Neurology

## 2019-09-08 NOTE — Telephone Encounter (Signed)
This patient did not show for a revisit appointment today. 

## 2019-09-28 ENCOUNTER — Telehealth: Payer: Self-pay | Admitting: Family Medicine

## 2019-09-28 NOTE — Telephone Encounter (Signed)
Pt is calling and would like to have a referral sent to have a colonoscopy. He said he received a letter in the mail saying it was time for him to have another one done.

## 2019-09-30 ENCOUNTER — Other Ambulatory Visit: Payer: Self-pay | Admitting: Family Medicine

## 2019-09-30 DIAGNOSIS — E785 Hyperlipidemia, unspecified: Secondary | ICD-10-CM

## 2019-09-30 NOTE — Telephone Encounter (Signed)
Please ask patient to schedule follow up visit with me in the clinic Thanks Leeanne Rio, MD

## 2019-10-02 NOTE — Telephone Encounter (Signed)
LVM for patient stating that per Dr. Ardelia Mems, he is not due for a colonoscopy until August 2022.  [name], Rose

## 2019-10-02 NOTE — Telephone Encounter (Signed)
Reviewed chart and it looks like patient is not due to have another colonoscopy until August 2022 (3 years after his last one).  Red team, can you contact him to clarify? Thanks Leeanne Rio, MD

## 2019-10-05 ENCOUNTER — Ambulatory Visit (INDEPENDENT_AMBULATORY_CARE_PROVIDER_SITE_OTHER): Payer: Medicare Other | Admitting: Family Medicine

## 2019-10-05 ENCOUNTER — Other Ambulatory Visit: Payer: Self-pay

## 2019-10-05 ENCOUNTER — Encounter: Payer: Self-pay | Admitting: Family Medicine

## 2019-10-05 VITALS — BP 118/88 | HR 56 | Wt 200.0 lb

## 2019-10-05 DIAGNOSIS — R7303 Prediabetes: Secondary | ICD-10-CM | POA: Diagnosis present

## 2019-10-05 DIAGNOSIS — I10 Essential (primary) hypertension: Secondary | ICD-10-CM

## 2019-10-05 DIAGNOSIS — E785 Hyperlipidemia, unspecified: Secondary | ICD-10-CM

## 2019-10-05 DIAGNOSIS — Z23 Encounter for immunization: Secondary | ICD-10-CM

## 2019-10-05 LAB — POCT GLYCOSYLATED HEMOGLOBIN (HGB A1C): Hemoglobin A1C: 5.7 % — AB (ref 4.0–5.6)

## 2019-10-05 NOTE — Assessment & Plan Note (Signed)
Chronic issue. Return for fasting lipids to assess control.  Room to titrate up on atorvastatin if needed.

## 2019-10-05 NOTE — Progress Notes (Signed)
Date of Visit: 10/05/2019   HPI:  Patient presents for follow up.  Hypertension - currently taking metoprolol XL 200mg  daily, HCTZ 25mg  daily, losartan 50mg  daily. Denies chest pain or shortness of breath. Checks blood pressure at home and gets around 110/85. No lightheadedness or dizziness. Feels well on this regimen.  Hyperlipidemia - taking atorvastatin 40mg  daily. nonfasting today. No chest pain or shortness of breath.   Prediabetes - would like to have diabetes test updated today. Reports a strong family history of diabetes.   Needs forms completed for DMV but did not bring forms with him today.  ROS: See HPI.  [place]: history of androgen deficiency, constipation, hypertension, hyperlipidemia, overweight, schizoaffective disorder, seizure d/o  PHYSICAL EXAM: BP 118/88   Pulse (!) 56   Wt 200 lb (90.7 kg)   SpO2 97%   BMI 27.89 kg/m  Gen: no acute distress, pleasant, cooperative HEENT: normocephalic, atraumatic Heart: regular rate and rhythm, no murmur Lungs: clear to auscultation bilaterally, normal work of breathing  Neuro: alert, speech normal Ext: No appreciable lower extremity edema bilaterally   ASSESSMENT/PLAN:  Health maintenance:  -flu shot & pneumovax 23 shots given today -otherwise UTD on HM items  Prediabetes Chronic issue. Update A1c today to assess control.  Hyperlipidemia Chronic issue. Return for fasting lipids to assess control.  Room to titrate up on atorvastatin if needed.  Essential hypertension Well controlled. Continue current regimen. Recent CMET unremarkable.   DMV forms - these take a long time to complete and are complex, he will need a dedicated visit to address these  FOLLOW UP: Schedule visit to discuss DMV forms Follow up in 6 mos for chronic medical problems  [place] J. Ardelia Mems, Amanda

## 2019-10-05 NOTE — Assessment & Plan Note (Signed)
Well controlled. Continue current regimen. Recent CMET unremarkable.

## 2019-10-05 NOTE — Patient Instructions (Signed)
It was great to see you again today!  Checking A1c today On your way out, schedule an appointment one morning to come back for fasting labs. Do not eat or drink anything other than water the morning of your lab appointment until after your labs are drawn.   Pneumonia and flu shots given today  Schedule appointment to discuss DMV forms  Be well, Dr. Ardelia Mems

## 2019-10-05 NOTE — Assessment & Plan Note (Signed)
Chronic issue. Update A1c today to assess control.

## 2019-10-06 NOTE — Progress Notes (Signed)
PATIENT: [name] DOB: [birth year]  REASON FOR VISIT: follow up HISTORY FROM: patient  HISTORY OF PRESENT ILLNESS: Today 10/07/19  [name] is a 63 year old male with history of schizoaffective disorder and history of seizures.  He indicates he has been doing well since last seen.  He has not had recurrent seizure.  He has remained on Vimpat 150 mg twice a day.  He has continued to remain off Dilantin.  The record indicates he was taken to the hospital in January 2020 for what was thought to be a seizure.  He indicates it was not a seizure, that he accidentally overdosed on Ativan.  He has a significant psychiatric history, he is taking amitriptyline, Seroquel, and Ambien.  He has no longer taking Lamictal.  He indicates his memory has improved since he came off Klonopin.  He says he still may lose his train of thought midsentence.  He does live with his significant other.  He is able to perform all of his own ADLs and manage his medications.  He has not been driving a car.  He does wish to begin driving a car, but has been told by his primary doctor that he needs neuropsychological evaluation beforehand.  He presents today for follow-up unaccompanied.  HISTORY 05/27/2019 SS: [name] is a 63 year old man with history of schizoaffective disorder and history of seizures.  He indicates that he has been doing well.  He reports that his memory has quite improved with the discontinuation of Klonopin.  He reports he has not had any seizure events.  He tells me he is no longer taking Dilantin, but he is taking Vimpat for seizure control.  He currently lives with his significant other, and manages his own medications.  He is not currently driving a car.  He remains under the care of psychiatry, is taking Lamictal, Seroquel, amitriptyline, Ambien as needed for insomnia.  He had a hospital admission in February 2020 after he took too much of his medication, Ativan.   On 01/13/2019 Apparently he went to  the ED after a reported seizure while sitting in the recliner.  EMS reports that his behavior was erratic, agitated, enough to give 5 mg of Versed.  Upon arrival to the ED, he had returned to baseline.  Apparently he had taken himself off Dilantin without medical consultation. Dilantin level in December 2019, January 2020, February 2020 was 0.   07/10/2018 Dr. [name]: [name] is a 63 year old left-handed white male with a history of a schizoaffective disorder, and history of seizures. The patient has not had any seizures in at least 3 or 3-1/2 years. The patient had a flurry of seizures at that time and he believes that since that time he has had some decline in memory. His wife believes that his problems with memory have worsened over the last 4 months. He was evaluated for memory disorder in November 2018, blood work and MRI of the brain was done at that time. The patient had reported at that time that he was having intermittent episodes of being confused and staggery, it was felt that it was related to overuse of clonazepam. The patient has cut back on clonazepam but when he does take it he gets slightly confused and staggery even now. The patient is on very high dose amitriptyline at 200 mg at night, he takes Seroquel, Dilantin, and Vimpat, all of these medications may have cognitive side effects. The wife reports that he has difficulty making logical decisions  at times, he does not follow through with performing tasks, he does have some generalized short-term memory problems. He has been under a lot of stressrecently which has worsened his cognitive functioning. His Mini-Mental status examination score today is better now than it was in November 2018  REVIEW OF SYSTEMS: Out of a complete 14 system review of symptoms, the patient complains only of the following symptoms, and all other reviewed systems are negative.  Memory loss, seizures  ALLERGIES: Allergies  Allergen Reactions  .  Codeine Nausea And Vomiting  . Sulfa Antibiotics Nausea And Vomiting  . Sulfasalazine Nausea And Vomiting    HOME MEDICATIONS: Outpatient Medications Prior to Visit  Medication Sig Dispense Refill  . acetaminophen (TYLENOL) 500 MG tablet Take 1,000 mg by mouth every 6 (six) hours as needed for mild pain or moderate pain.    Marland Kitchen amitriptyline (ELAVIL) 100 MG tablet Take 2 tablets (200 mg total) by mouth at bedtime. 60 tablet 2  . atorvastatin (LIPITOR) 40 MG tablet TAKE 1 TABLET BY MOUTH EVERY DAY 90 tablet 0  . hydrochlorothiazide (HYDRODIURIL) 25 MG tablet Take 1 tablet (25 mg total) by mouth daily. 90 tablet 0  . losartan (COZAAR) 25 MG tablet TAKE 2 TABLETS BY MOUTH EVERY DAY 180 tablet 0  . metoprolol succinate (TOPROL-XL) 100 MG 24 hr tablet TAKE 2 TABLETS (200 MG TOTAL) DAILY BY MOUTH. (Patient taking differently: Take 200 mg by mouth at bedtime. ) 180 tablet 3  . Multiple Vitamin (MULTIVITAMIN WITH MINERALS) TABS tablet Take 1 tablet by mouth daily.    . polyethylene glycol powder ([employer] PURELAX) powder TAKE 17 GRAMS 2 TIMES A DAY AS NEEDED FOR CONSTIPATION. MIX INTO 8 OF FLUID AND DRINK (Patient taking differently: Take 17 g by mouth daily. Mix into 8 oz of fluid and drink) 510 g 1  . QUEtiapine (SEROQUEL) 300 MG tablet Take 2 tablets (600 mg total) by mouth at bedtime. 60 tablet 2  . testosterone cypionate (DEPOTESTOSTERONE CYPIONATE) 200 MG/ML injection Inject 0.75 mLs (150 mg total) into the muscle every 14 (fourteen) days.    Marland Kitchen testosterone cypionate (DEPOTESTOSTERONE CYPIONATE) 200 MG/ML injection SMARTSIG:1 Milliliter(s) IM Every 2 Weeks    . VIMPAT 150 MG TABS TAKE 1 TABLET BY MOUTH TWICE A DAY 60 tablet 5  . zolpidem (AMBIEN) 5 MG tablet Take 1 tablet (5 mg total) by mouth at bedtime as needed for sleep. 15 tablet 2   No facility-administered medications prior to visit.     PAST MEDICAL HISTORY: Past Medical History:  Diagnosis Date  . Anxiety   . Constipation   . Depression    . Dyslipidemia   . GERD (gastroesophageal reflux disease)   . Hearing loss   . History of echocardiogram    Echo 8/18: EF 50-55, normal wall motion, grade 1 diastolic dysfunction, mild MR  . Hypertension   . Memory difficulty 10/25/2017  . Memory loss   . MRSA carrier   . Schizoaffective disorder   . Seizures ([place])     PAST SURGICAL HISTORY: Past Surgical History:  Procedure Laterality Date  . CARPAL TUNNEL RELEASE    . COLONOSCOPY  2010  . ORIF ANKLE FRACTURE Left 11/26/2018   Procedure: OPEN REDUCTION INTERNAL FIXATION (ORIF) ANKLE FRACTURE;  Surgeon: Hiram Gash, MD;  Location: [facility];  Service: Orthopedics;  Laterality: Left;  . ORIF SHOULDER FRACTURE    . self orchectomy    . SHOULDER CLOSED REDUCTION Left 09/16/2013   Procedure: CLOSED REDUCTION  SHOULDER;  Surgeon: Mauri Pole, MD;  Location: [facility];  Service: Orthopedics;  Laterality: Left;  . SHOULDER HEMI-ARTHROPLASTY Left 09/18/2013   Procedure: LEFT SHOULDER HEMI-ARTHROPLASTY;  Surgeon: Augustin Schooling, MD;  Location: [facility];  Service: Orthopedics;  Laterality: Left;  . TIBIA IM NAIL INSERTION Left 11/26/2018   Procedure: INTRAMEDULLARY (IM) NAIL TIBIAL;  Surgeon: Hiram Gash, MD;  Location: [facility];  Service: Orthopedics;  Laterality: Left;    FAMILY HISTORY: Family History  Problem Relation Age of Onset  . Stroke Father        Living at 68  . Stroke Mother        Stroke in late 51's. Died in her 9's  . Alcohol abuse Brother   . Alcohol abuse Brother   . Diabetes Brother   . Colon cancer Neg Hx   . Esophageal cancer Neg Hx   . Stomach cancer Neg Hx   . Rectal cancer Neg Hx     SOCIAL HISTORY: Social History   Socioeconomic History  . Marital status: Single    Spouse name: Not on file  . Number of children: 0  . Years of education: 82  . Highest education level: Not on file  Occupational History  . Occupation: disabled  Social Needs  . Financial resource strain: Not hard at all  . Food  insecurity    Worry: Never true    Inability: Never true  . Transportation needs    Medical: No    Non-medical: No  Tobacco Use  . Smoking status: Never Smoker  . Smokeless tobacco: Never Used  Substance and Sexual Activity  . Alcohol use: No    Alcohol/week: 0.0 standard drinks  . Drug use: No  . Sexual activity: Not Currently    Partners: Female    Birth control/protection: None  Lifestyle  . Physical activity    Days per week: 0 days    Minutes per session: 0 min  . Stress: Only a little  Relationships  . Social connections    Talks on phone: More than three times a week    Gets together: More than three times a week    Attends religious service: Never    Active member of club or organization: No    Attends meetings of clubs or organizations: Never    Relationship status: Not on file  . Intimate partner violence    Fear of current or ex partner: Not on file    Emotionally abused: Not on file    Physically abused: Not on file    Forced sexual activity: Not on file  Other Topics Concern  . Not on file  Social History Narrative   Patient does not drink caffeine.   Patient is left handed.      PHYSICAL EXAM  Vitals:   10/07/19 1249  BP: 124/78  Pulse: 67  Temp: (!) 96.9 F (36.1 C)  Weight: 201 lb 6.4 oz (91.4 kg)  Height: 5\' 11"  (1.803 m)   Body mass index is 28.09 kg/m.  Generalized: Well developed, in no acute distress  MMSE - Mini Mental State Exam 10/07/2019 05/15/2018 10/25/2017  Orientation to time 4 5 4   Orientation to Place 5 4 5   Registration 3 3 3   Attention/ Calculation 3 3 2   Recall 3 3 2   Language- name 2 objects 2 2 2   Language- repeat 1 1 1   Language- follow 3 step command 3 3 3   Language- read & follow direction  1 1 1   Write a sentence 1 1 1   Copy design 1 1 1   Copy design-comments named 10 animals - -  Total score 27 27 25     Neurological examination  Mentation: Alert oriented to time, place, history taking. Follows all  commands speech and language fluent Cranial nerve II-XII: Pupils were equal round reactive to light. Extraocular movements were full, visual field were full on confrontational test. Facial sensation and strength were normal. . Head turning and shoulder shrug  were normal and symmetric. Motor: The motor testing reveals 5 over 5 strength of all 4 extremities. Good symmetric motor tone is noted throughout.  Sensory: Sensory testing is intact to soft touch on all 4 extremities. No evidence of extinction is noted.  Coordination: Cerebellar testing reveals good finger-nose-finger and heel-to-shin bilaterally.  Gait and station: Gait is normal. Tandem gait is mildly unsteady. Romberg is negative. No drift is seen.  Reflexes: Deep tendon reflexes are symmetric and normal bilaterally.   DIAGNOSTIC DATA (LABS, IMAGING, TESTING) - I reviewed patient records, labs, notes, testing and imaging myself where available.  Lab Results  Component Value Date   WBC 8.7 06/16/2019   HGB 17.2 06/16/2019   HCT 50.8 06/16/2019   MCV 92 06/16/2019   PLT 192 06/16/2019      Component Value Date/Time   NA 140 06/16/2019 1035   K 4.1 06/16/2019 1035   CL 104 06/16/2019 1035   CO2 18 (L) 06/16/2019 1035   GLUCOSE 110 (H) 06/16/2019 1035   GLUCOSE 118 (H) 01/24/2019 0820   BUN 18 06/16/2019 1035   CREATININE 1.16 06/16/2019 1035   CREATININE 0.87 02/18/2017 1413   CALCIUM 9.4 06/16/2019 1035   PROT 6.4 06/16/2019 1035   ALBUMIN 4.4 06/16/2019 1035   AST 15 06/16/2019 1035   ALT 18 06/16/2019 1035   ALKPHOS 104 06/16/2019 1035   BILITOT 0.3 06/16/2019 1035   GFRNONAA 66 06/16/2019 1035   GFRNONAA >89 02/18/2017 1413   GFRAA 77 06/16/2019 1035   GFRAA >89 02/18/2017 1413   Lab Results  Component Value Date   CHOL 141 04/01/2018   HDL 55 04/01/2018   LDLCALC 63 04/01/2018   TRIG 116 04/01/2018   CHOLHDL 2.6 04/01/2018   Lab Results  Component Value Date   HGBA1C 5.7 (A) 10/05/2019   Lab Results   Component Value Date   [id number] 10/25/2017   Lab Results  Component Value Date   TSH 2.100 04/01/2018      ASSESSMENT AND PLAN 63 y.o. year old male  has a past medical history of Anxiety, Constipation, Depression, Dyslipidemia, GERD (gastroesophageal reflux disease), Hearing loss, History of echocardiogram, Hypertension, Memory difficulty (10/25/2017), Memory loss, MRSA carrier, Schizoaffective disorder, and Seizures ([place]). here with:  1.  Seizures 2.  Schizoaffective disorder 3.  Memory disturbance  He has not had recurrent seizure since last seen.  In the past he has been on Dilantin and Vimpat.  It appears he took himself off Dilantin in  2019 for unknown reason.  He has remained on Vimpat 150 mg twice a day.  I will provide a refill of Vimpat today.  I will also place a order for a formal neuropsychological evaluation at [facility] neurology to check for organic dementia.  His memory score has remained stable. He had lab work done in June 2020 that showed he was taking Vimpat.  He will follow-up in 6 months or sooner if needed for recheck with Dr. [name]. For now he  will remain off Dilantin, and continue to take Vimpat for seizure prevention. He does have a significant psychiatric history. He thinks his cognition has improved since off Dilantin and Klonopin. His memory score is stable 27/30.   I spent 25 minutes with the patient. 50% of this time was spent discussing his plan of care.   Butler Denmark, AGNP-C, DNP 10/07/2019, 12:58 PM [facility] [address] [place], [place] [address] [phone]

## 2019-10-07 ENCOUNTER — Encounter: Payer: Self-pay | Admitting: Neurology

## 2019-10-07 ENCOUNTER — Other Ambulatory Visit: Payer: Self-pay

## 2019-10-07 ENCOUNTER — Ambulatory Visit (INDEPENDENT_AMBULATORY_CARE_PROVIDER_SITE_OTHER): Payer: Medicare Other | Admitting: Neurology

## 2019-10-07 VITALS — BP 124/78 | HR 67 | Temp 96.9°F | Ht 71.0 in | Wt 201.4 lb

## 2019-10-07 DIAGNOSIS — G40909 Epilepsy, unspecified, not intractable, without status epilepticus: Secondary | ICD-10-CM

## 2019-10-07 DIAGNOSIS — R413 Other amnesia: Secondary | ICD-10-CM

## 2019-10-07 MED ORDER — VIMPAT 150 MG PO TABS: 1.0000 | ORAL_TABLET | Freq: Two times a day (BID) | ORAL | 5 refills | Status: DC

## 2019-10-07 NOTE — Patient Instructions (Signed)
1. Continue taking Vimpat  2. Please see the neuropsychologist

## 2019-10-07 NOTE — Progress Notes (Signed)
I have read the note, and I agree with the clinical assessment and plan.  Vir Whetstine K Kijana Estock   

## 2019-10-21 ENCOUNTER — Ambulatory Visit (INDEPENDENT_AMBULATORY_CARE_PROVIDER_SITE_OTHER): Payer: Medicaid Other | Admitting: Psychiatry

## 2019-10-21 ENCOUNTER — Encounter (HOSPITAL_COMMUNITY): Payer: Self-pay | Admitting: Psychiatry

## 2019-10-21 ENCOUNTER — Other Ambulatory Visit: Payer: Self-pay

## 2019-10-21 DIAGNOSIS — F250 Schizoaffective disorder, bipolar type: Secondary | ICD-10-CM | POA: Diagnosis not present

## 2019-10-21 DIAGNOSIS — F411 Generalized anxiety disorder: Secondary | ICD-10-CM | POA: Diagnosis not present

## 2019-10-21 MED ORDER — ZOLPIDEM TARTRATE 5 MG PO TABS: 5.0000 mg | ORAL_TABLET | Freq: Every evening | ORAL | 1 refills | Status: DC | PRN

## 2019-10-21 MED ORDER — QUETIAPINE FUMARATE 200 MG PO TABS: 400.0000 mg | ORAL_TABLET | Freq: Every day | ORAL | 1 refills | Status: DC

## 2019-10-21 MED ORDER — AMITRIPTYLINE HCL 100 MG PO TABS: 200.0000 mg | ORAL_TABLET | Freq: Every day | ORAL | 1 refills | Status: DC

## 2019-10-21 NOTE — Progress Notes (Signed)
Virtual Visit via Telephone Note  I connected with [name] on 10/21/19 at  2:00 PM EST by telephone and verified that I am speaking with the correct person using two identifiers.   I discussed the limitations, risks, security and privacy concerns of performing an evaluation and management service by telephone and the availability of in person appointments. I also discussed with the patient that there may be a patient responsible charge related to this service. The patient expressed understanding and agreed to proceed.   History of Present Illness: Patient was evaluated by phone session.  He is complaining of feeling tired and despite staying in the bed for many hours.  Fatigue and did not get enough sleep.  He is still struggle with chronic paranoia and delusion that people watching him but now he is able to distract these thoughts better.  He does not leave the house unless it is important.  Recently had a blood work and his hemoglobin A1c is 5.7.  He is letting a homeless woman with him who helps him and he is afraid to let her go because he feels dependent on her.  He is getting testosterone injection.  He is taking a very high dose of Seroquel and amitriptyline but he is afraid cutting down the dose may cause worsening of the symptoms.  He also takes Ambien 5 mg which helps in sleep.  He denies drinking or using any illegal substances.  His appetite is okay.  His weight reported unchanged from the past.  He has no tremors, shakes or any EPS.  Past Psychiatric History:Reviewed. H/Odepression, paranoia, suicidal attempt, delusions and anxiety. H/Oat least 15 inpatient.  History of cutting his testicles because does not want to remain depressed.  Thoughts of inhaling helium to end his life.  last inpatient August 2016. D/C on Seroquel 900 mg and amitriptyline 400 mgat bedtime.Seen [facility] and [name]. Triedmultiple trial of SSRIsbutreaction  toSSRI.Triednortriptyline, Haldol, Geodon, Seroquel, lithium, Prozac, Wellbutrin, Paxil,Klonopin,Ativan, Lexapro, Zoloft and recently Lamictal. No H/Omania or drug use.  Recent Results (from the past 2160 hour(s))  POCT glycosylated hemoglobin (Hb A1C)     Status: Abnormal   Collection Time: 10/05/19  3:42 PM  Result Value Ref Range   Hemoglobin A1C 5.7 (A) 4.0 - 5.6 %   HbA1c POC (<> result, manual entry)     HbA1c, POC (prediabetic range)     HbA1c, POC (controlled diabetic range)       Psychiatric Specialty Exam: Physical Exam  ROS  There were no vitals taken for this visit.There is no height or weight on file to calculate BMI.  General Appearance: NA  Eye Contact:  NA  Speech:  Slow  Volume:  Decreased  Mood:  Dysphoric and tired  Affect:  NA  Thought Process:  Descriptions of Associations: Intact  Orientation:  Full (Time, Place, and Person)  Thought Content:  Paranoid Ideation and Rumination  Suicidal Thoughts:  No  Homicidal Thoughts:  No  Memory:  Immediate;   Fair Recent;   Fair Remote;   Fair  Judgement:  Fair  Insight:  Fair  Psychomotor Activity:  NA  Concentration:  Concentration: Fair and Attention Span: Fair  Recall:  [employer] of Knowledge:  Fair  Language:  Good  Akathisia:  No  Handed:  Right  AIMS (if indicated):     Assets:  Communication Skills Desire for Improvement Housing Resilience Social Support  ADL's:  Intact  Cognition:  Impaired,  Mild  Sleep:   ok but tired      Assessment and Plan: Schizoaffective disorder, bipolar type.  Generalized anxiety disorder.  We discussed higher dose of psychotropic medication.  I explained it could be due to moderate dose of Seroquel.  In the past he has taken 900 mg Seroquel but gradually we have cut it down.  We talked about cutting down further to 400 mg and he agreed with the plan.  I recommend to try Seroquel 400 mg at bedtime.  He is afraid to cut down amitriptyline because he does not  want to get depressed.  I reviewed his blood work.  His hemoglobin A1c is 5.7.  Encouraged to walk every day 10 to 15 minutes.  Discussed sleep hygiene.  Continue Ambien 5 mg at bedtime.  Recommended to call us back if is any question or any concern.  Follow-up in 6 weeks.  Reminded that if paranoia and delusions started to get worse with reducing Seroquel then he need to call us or go back to original dose of Seroquel.  Follow Up Instructions:    I discussed the assessment and treatment plan with the patient. The patient was provided an opportunity to ask questions and all were answered. The patient agreed with the plan and demonstrated an understanding of the instructions.   The patient was advised to call back or seek an in-person evaluation if the symptoms worsen or if the condition fails to improve as anticipated.  I provided 20 minutes of non-face-to-face time during this encounter.   Kathlee Nations, MD

## 2019-11-05 ENCOUNTER — Encounter: Payer: Self-pay | Admitting: Psychology

## 2019-11-08 ENCOUNTER — Other Ambulatory Visit: Payer: Self-pay | Admitting: Family Medicine

## 2019-11-08 DIAGNOSIS — I10 Essential (primary) hypertension: Secondary | ICD-10-CM

## 2019-11-15 ENCOUNTER — Other Ambulatory Visit (HOSPITAL_COMMUNITY): Payer: Self-pay | Admitting: Psychiatry

## 2019-11-15 DIAGNOSIS — F250 Schizoaffective disorder, bipolar type: Secondary | ICD-10-CM

## 2019-11-18 ENCOUNTER — Other Ambulatory Visit: Payer: Self-pay | Admitting: Family Medicine

## 2019-11-18 ENCOUNTER — Other Ambulatory Visit (HOSPITAL_COMMUNITY): Payer: Self-pay | Admitting: Psychiatry

## 2019-12-03 ENCOUNTER — Other Ambulatory Visit: Payer: Medicaid Other

## 2019-12-09 ENCOUNTER — Other Ambulatory Visit: Payer: Medicare Other

## 2019-12-14 ENCOUNTER — Other Ambulatory Visit: Payer: Medicare Other

## 2019-12-14 ENCOUNTER — Encounter: Payer: Medicaid Other | Admitting: Psychology

## 2019-12-21 ENCOUNTER — Other Ambulatory Visit: Payer: Medicare Other

## 2019-12-22 ENCOUNTER — Encounter: Payer: Medicaid Other | Admitting: Psychology

## 2019-12-25 ENCOUNTER — Encounter: Payer: Medicaid Other | Admitting: Psychology

## 2019-12-26 ENCOUNTER — Other Ambulatory Visit: Payer: Self-pay | Admitting: Family Medicine

## 2019-12-26 DIAGNOSIS — E785 Hyperlipidemia, unspecified: Secondary | ICD-10-CM

## 2019-12-28 NOTE — Telephone Encounter (Signed)
Please remind patient to schedule a fasting lab visit to check his cholesterol, thanks Leeanne Rio, MD

## 2019-12-29 NOTE — Telephone Encounter (Signed)
LVM for patient to return call to schedule f/u lab appointment.   Talbot Grumbling, RN

## 2019-12-31 ENCOUNTER — Encounter: Payer: Medicaid Other | Admitting: Psychology

## 2019-12-31 NOTE — Telephone Encounter (Signed)
Left second voicemail for patient to return call to schedule follow lab visit.   Talbot Grumbling, RN

## 2020-01-04 NOTE — Telephone Encounter (Signed)
Pt called and left 3rd voicemail for patient to return phone call to schedule lab visit.   Talbot Grumbling, RN

## 2020-01-05 ENCOUNTER — Other Ambulatory Visit: Payer: Self-pay

## 2020-01-05 ENCOUNTER — Encounter (HOSPITAL_COMMUNITY): Payer: Self-pay | Admitting: Psychiatry

## 2020-01-05 ENCOUNTER — Ambulatory Visit (INDEPENDENT_AMBULATORY_CARE_PROVIDER_SITE_OTHER): Payer: Medicare Other | Admitting: Psychiatry

## 2020-01-05 DIAGNOSIS — F250 Schizoaffective disorder, bipolar type: Secondary | ICD-10-CM | POA: Diagnosis not present

## 2020-01-05 DIAGNOSIS — E559 Vitamin D deficiency, unspecified: Secondary | ICD-10-CM

## 2020-01-05 DIAGNOSIS — R5383 Other fatigue: Secondary | ICD-10-CM

## 2020-01-05 DIAGNOSIS — R7989 Other specified abnormal findings of blood chemistry: Secondary | ICD-10-CM

## 2020-01-05 DIAGNOSIS — F411 Generalized anxiety disorder: Secondary | ICD-10-CM

## 2020-01-05 MED ORDER — AMITRIPTYLINE HCL 100 MG PO TABS: 200.0000 mg | ORAL_TABLET | Freq: Every day | ORAL | 0 refills | Status: DC

## 2020-01-05 MED ORDER — QUETIAPINE FUMARATE 200 MG PO TABS: 400.0000 mg | ORAL_TABLET | Freq: Every day | ORAL | 0 refills | Status: DC

## 2020-01-05 NOTE — Progress Notes (Signed)
Virtual Visit via Telephone Note  I connected with [name] on 01/05/20 at  2:40 PM EST by telephone and verified that I am speaking with the correct person using two identifiers.   I discussed the limitations, risks, security and privacy concerns of performing an evaluation and management service by telephone and the availability of in person appointments. I also discussed with the patient that there may be a patient responsible charge related to this service. The patient expressed understanding and agreed to proceed.   History of Present Illness: Patient was evaluated by phone session.  His support person who lives with her also available on the phone.  Patient told chronic depression and fatigue and feels tired all the time.  We have reduce Seroquel on the last visit which helps his sleep as he is not sleeping all day.  However his friend reported that patient stays all the time of the bed and his personal hygiene is not good.  She is helping him a lot but he does not take any responsibility for his work.  She believes that patient needs a different medication however patient is very reluctant to change his amitriptyline to any other antidepressant.  He feels his depression has chronic and resistant and he had tried too many medication but none of them worked.  He has baseline paranoia and negative thoughts but he denies any suicidal thoughts or any aggression, violence.  He has not involved in any self abusive behavior.  His appetite is fair.  He does not leave the house for many days and is major concern is fatigue, lack of motivation, lack of energy.  He is taking Ambien, Seroquel and amitriptyline.   Past Psychiatric History:Reviewed. H/Odepression, paranoia, suicidal attempt, delusions and anxiety. H/Oat least 15 inpatient.  H/O cutting his testicles because does not want to remain depressed.  Thoughts of inhaling helium to end his life.  last inpatient August 2016. D/C on Seroquel 900 mg  and amitriptyline 400 mgat bedtime.Seen [facility] and [name]. Triedmultiple trial of SSRIsbutreaction toSSRI.Triednortriptyline, Haldol, Geodon, Seroquel, lithium, Prozac, Wellbutrin, Paxil,Klonopin,Ativan, Lexapro,Zoloft, abilifyand Lamictal. No H/Omania or drug use.   Psychiatric Specialty Exam: Physical Exam  Review of Systems  There were no vitals taken for this visit.There is no height or weight on file to calculate BMI.  General Appearance: NA  Eye Contact:  NA  Speech:  Clear and Coherent and Slow  Volume:  Decreased  Mood:  Dysphoric and tired  Affect:  NA  Thought Process:  Descriptions of Associations: Intact  Orientation:  Full (Time, Place, and Person)  Thought Content:  Paranoid Ideation and Rumination  Suicidal Thoughts:  No  Homicidal Thoughts:  No  Memory:  Immediate;   Fair Recent;   Fair Remote;   Fair  Judgement:  Fair  Insight:  Fair  Psychomotor Activity:  NA  Concentration:  Concentration: Fair and Attention Span: Fair  Recall:  [employer] of Knowledge:  Fair  Language:  Fair  Akathisia:  No  Handed:  Right  AIMS (if indicated):     Assets:  Communication Skills Desire for Improvement Social Support  ADL's:  Impaired  Cognition:  WNL  Sleep:   better      Assessment and Plan: Schizoaffective disorder, bipolar type.  Generalized anxiety disorder.  I reviewed past history with the patient.  He used to take a higher dose of Seroquel and we have cut down slowly because he is complaining of tiredness and  fatigue.  He is resistant to change his antidepressant because he believes only TCA helped help him.  I recommend that we should do GeneSight testing to see if he can try Viibryd or Trintellix.  I also believe we should get TSH and vitamin D level since patient has not had these blood work in a while.  He does not compliant with his primary care visits.  I recommend to discontinue Ambien since he is sleeping  better and does not need Ambien.  For now we will continue Seroquel 400 mg at bedtime and amitriptyline 200 mg at bedtime.  We will consider trying Viibryd or Trintellix after GeneSight testing.  Recommended to call us back if is any question of any concern.  Follow-up in 4 weeks.  Follow Up Instructions:    I discussed the assessment and treatment plan with the patient. The patient was provided an opportunity to ask questions and all were answered. The patient agreed with the plan and demonstrated an understanding of the instructions.   The patient was advised to call back or seek an in-person evaluation if the symptoms worsen or if the condition fails to improve as anticipated.  I provided 20 minutes of non-face-to-face time during this encounter.   Kathlee Nations, MD

## 2020-01-06 ENCOUNTER — Telehealth (HOSPITAL_COMMUNITY): Payer: Self-pay | Admitting: *Deleted

## 2020-01-06 NOTE — Telephone Encounter (Signed)
Orders for TSH and Vit. D level faxed to [facility]. Writer called pt to notify however VM is full.

## 2020-01-19 ENCOUNTER — Telehealth (HOSPITAL_COMMUNITY): Payer: Self-pay | Admitting: *Deleted

## 2020-01-19 ENCOUNTER — Other Ambulatory Visit: Payer: Self-pay | Admitting: Family Medicine

## 2020-01-19 DIAGNOSIS — I10 Essential (primary) hypertension: Secondary | ICD-10-CM

## 2020-01-19 NOTE — Telephone Encounter (Signed)
Writer returned pt message being able to leave a detailed message this time. Pt instructed that orders for TSH and Vit D levels have been faxed to [facility] at [facility] in [place]. Pt also mentioned Genesight testing and he was encouraged to come to clinic anytime during working hours with the exception of 11-1299 as we are closed for lunch. Pt instructed to call this nurse with questions/ concerns @ clinic main number.

## 2020-01-20 NOTE — Telephone Encounter (Signed)
Thanks for update

## 2020-01-21 ENCOUNTER — Other Ambulatory Visit: Payer: Medicare Other

## 2020-01-26 ENCOUNTER — Telehealth (HOSPITAL_COMMUNITY): Payer: Self-pay | Admitting: *Deleted

## 2020-01-26 NOTE — Telephone Encounter (Signed)
FYI Pt called again asking where to go for lab drw and also about GeneSight testing. Writer has left several messages regarding this. So nurse reminded of that then pt stated he was going to be seeing a new provider, Dr.Barnes with [facility] and would just have labs drawn there. Pt asked writer if he would be coming back to this clinic and pt stated "no".

## 2020-01-27 NOTE — Telephone Encounter (Signed)
If he decided that he does not want to continue with [place] than I am ok. Please confirm with him and we can send letter with referral names and close his case.

## 2020-01-29 ENCOUNTER — Encounter: Payer: Medicare Other | Admitting: Psychology

## 2020-02-05 ENCOUNTER — Telehealth: Payer: Medicare Other | Admitting: Psychology

## 2020-02-08 ENCOUNTER — Ambulatory Visit: Payer: Medicare Other

## 2020-02-08 ENCOUNTER — Encounter: Payer: Self-pay | Admitting: Counselor

## 2020-02-08 ENCOUNTER — Other Ambulatory Visit: Payer: Self-pay

## 2020-02-08 ENCOUNTER — Ambulatory Visit (INDEPENDENT_AMBULATORY_CARE_PROVIDER_SITE_OTHER): Payer: Medicare Other | Admitting: Counselor

## 2020-02-08 DIAGNOSIS — F259 Schizoaffective disorder, unspecified: Secondary | ICD-10-CM

## 2020-02-08 DIAGNOSIS — R413 Other amnesia: Secondary | ICD-10-CM

## 2020-02-08 DIAGNOSIS — R4189 Other symptoms and signs involving cognitive functions and awareness: Secondary | ICD-10-CM

## 2020-02-08 DIAGNOSIS — T50905S Adverse effect of unspecified drugs, medicaments and biological substances, sequela: Secondary | ICD-10-CM

## 2020-02-08 DIAGNOSIS — F99 Mental disorder, not otherwise specified: Secondary | ICD-10-CM

## 2020-02-08 NOTE — Progress Notes (Signed)
   Psychometrician Note   Cognitive testing was administered to [name] by Cruzita Lederer, B.S. (psychometrist) under the supervision of Dr. Alphonzo Severance, Psy.D., ABN, licensed psychologist. Mr. Reineck did not appear overtly distressed by the testing session per behavioral observation or responses across self-report questionnaires. Dr. Alphonzo Severance, Psy.D., ABN checked in with Mr. Rossow as needed to manage any distress related to testing procedures (if applicable). Rest breaks were offered.    The battery of tests administered was selected by Dr. Alphonzo Severance, Psy.D., ABN with consideration to Mr. Dafoe current level of functioning, the nature of his symptoms, emotional and behavioral responses during interview, level of literacy, observed level of motivation/effort, and the nature of the referral question. This battery was communicated to the psychometrist. Communication between Dr. Alphonzo Severance, Psy.D., ABN and the psychometrist was ongoing throughout the evaluation and Dr. Alphonzo Severance, Psy.D., ABN was immediately accessible at all times. Dr. Alphonzo Severance, Psy.D., ABN provided supervision to the psychometrist on the date of this service to the extent necessary to assure the quality of all services provided.    ORVEL MACIOCE will return within approximately 1-2 weeks for an interactive feedback session with Dr. Nicole Kindred at which time his test performances, clinical impressions, and treatment recommendations will be reviewed in detail. Mr. Hehn understands he can contact our office should he require our assistance before this time.  A total of 115 minutes of billable time were spent face-to-face with Mr. Jaroszewski by the psychometrist. This includes both test administration and scoring time. Billing for these services is reflected in the clinical report generated by Dr. Alphonzo Severance, Psy.D., ABN.  This note reflects time spent with the psychometrician and does not include test scores or  any clinical interpretations made by Dr. Nicole Kindred. The full report will follow in a separate note.

## 2020-02-08 NOTE — Progress Notes (Signed)
[facility]  Patient Name: [name] MRN: [id number] Date of Birth: [birth year] Age: 64 y.o. Education: 16 years  Referral Circumstances and Background Information  [name] is a 64 y.o., left-hand dominant, partnered male with a history of schizoaffective disorder (Bipolar type), seizure disorder for several decades, and memory problems who was referred by Butler Denmark, NP [facility]) for neuropsychological evaluation in the service of diagnostic clarification and possible return to driving. The best I can reconstruct from his history and chart review, he had lost his license previously related to seizures, he returned to driving in [id number], and then he lost his driving privileges as a result of not recertifying about a year ago.   On interview, [name] stated that he lost his license related to seizures, and that was about a year ago. With respect to cognitive problems, [name] was not a particularly detailed historian but eventually acknowledged that his problems likely began after a string of seizures about 5 years ago. The only problem he spontaneously reported was losing his train of thought, which he has noticed more over the past year. I see mention in the chart that there have been concerns he may be demented previously, and at times he has had difficulty event caring for his basic personal care needs and hygiene, presumably related to his mental illness (see phone note from [facility], 10/22/2017).The note also mentions that his mental state was impacting his driving at that time, at least as reported by his partner, who stated he was driving erratically and too close to other cars. On review of cognitive system, [name] acknowledged having problems with attention and concentration, because he is often distracted by his thoughts (he denied that there are particular thoughts or or themes that distract him). He is constantly misplacing  things and is sometimes hesitant to move things for fear he will lose them. He said that his significant other [name] has told him that he repeats himself and he agrees. He denied any problems using computers, cell phones, or household appliances. He wasn't sure how he does with understanding complex things or following multi step instructions because he doesn't have the opportunity to do that very often. He does have problems with word finding sometimes, and forgets what word he wants to use, but denied that he has problems understanding other people.   [name] stated that as of late, he has been tired and fatigued and that he has felt that way at least six months. I see that he has been working on fatigue with his psychiatrist, Dr. Adele Schilder, who has decreased some of his medications but that hasn't eliminated his issues. He is still on amitryptaline and is hesitant to come off it related to his depressive symptoms. From review of notes it appears that he has been spending quite a bit of time in bed, which he acknowledged, and he stated that he has been spending about half the day in bed because he doesn't feel like getting up. When I asked about mood, he stated that he has a hard time feeling his emotions and he feels "emotionally numb." He has no libido, which has been going on for years, which bothers him to some extent. He did engage in self-castration many years ago. He stated that he used to get tearful but at this point, he does not get tearful because he feels numb. He stated that he does feel helpless, hopeless, and worthless at times  and has felt that way for years. I attempted to ask him about his history of psychotic symptoms, although that was challenging. He stated that he has heard voices before telling him to kill himself, which started a few years ago, but hasn't happened recently. He also feels that he "entered the kingdom of heaven" previously as a result of meditation and other people thought  he was crazy as a result.   With regard to functioning, [name] stated that his wife does most things around the house, including cooking, most of the cleaning, and other household tasks. He does heat food in the microwave and helps with some things like the dishes but doesn't have motivation to do much. He stated that he manages the bills and finances, although his significant other helps him because at times he gets disengaged with them. He spends most of his time at home but will venture out into the community to get groceries or other things with his significant other. He stated that he has always felt uncomfortable around others and prefers to be alone.   Past Medical History and Review of Relevant Studies   Patient Active Problem List   Diagnosis Date Noted  . Prediabetes 10/05/2019  . Acute metabolic encephalopathy 123456  . Encephalopathy acute 01/24/2019  . Closed tibia fracture 11/25/2018  . Fracture of tibial shaft, left, closed 11/25/2018  . Unintentional weight loss 04/07/2018  . Syncope and collapse 12/13/2017  . Memory difficulty 10/25/2017  . Presbyacusia 07/26/2017  . Palpitations 07/01/2017  . Acute bronchiolitis due to human metapneumovirus   . Mild sleep apnea 06/15/2016  . Actinic keratosis 06/15/2016  . Nocturnal dyspnea 02/03/2016  . Skin lesion of face 02/03/2016  . Paresthesia of both hands 09/16/2015  . Phenytoin toxicity   . TIA (transient ischemic attack) 07/29/2015  . Vertigo 07/29/2015  . Schizoaffective disorder, depressive type with good prognostic features ([place]) 07/13/2015  . Verruca 05/18/2015  . Seborrheic dermatitis of scalp 05/18/2015  . MDD (major depressive disorder), recurrent severe, without psychosis ([place])   . OCD (obsessive compulsive disorder)   . Seizures ([place])   . Schizoaffective disorder, depressive type ([place])   . Orthostatic hypotension   . Fall   . Seizure ([place]) 11/18/2014  . Schizoaffective disorder, unspecified type ([place])    . Essential hypertension   . Overdose of beta-adrenergic antagonist drug 10/22/2014  . Overdose   . Nocturnal enuresis 03/23/2014  . Benzodiazepine dependence, continuous ([place]) 01/27/2014  . Severe major depression ([place]) 01/21/2014  . Frozen shoulder syndrome 09/20/2013  . Somnolence 09/20/2013  . Schizoaffective disorder ([place]) 09/20/2013  . Presbyopia 04/25/2012  . Androgen deficiency 09/07/2011  . Constipation, chronic 09/07/2011  . Other specified disorders of liver 12/30/2008  . Overweight(278.02) 09/08/2008  . Hyperlipidemia 01/28/2008  . Seizure disorder ([place]) 01/28/2008  . OSTEOPENIA 07/01/2007    Review of Neuroimaging and Relevant Studies:  [name] denied any history strokes, he does have a history of seizures for approximately 30 years and is currently managed with Vimpat. He stated that he did have a head injury when he was a child falling off a slide and stated that he was admitted to the hospital but he didn't have any further details after that and it's unclear how significant it was. He stated that his last seizure was about 4 years ago, although I see in the chart that he was taken to the hospital in January 2020 for what was believed to be a seizure. He says he  accidentally overdosed on Ativan (he was anxious because his partner was having company over and he was trying to control his symptoms).   [name] most recent neuroimaging is a CT head from 01/24/2019 that shows mild generalized parenchymal volume loss and may show some minimal areas of leukoaraiosis suggestive of small vessel ischemic disease but they are not particularly conspicuous given the imaging modality.   [name] was evaluated by Norton Pastel, PsyD, ABN (Neuropsychologist) in 2019 but then never had the evaluation done. It seems that the primary concern at that time was that he might be developing a neurodegenerative disorder.   MMSE - Mini Mental State Exam 10/07/2019 05/15/2018 10/25/2017    Orientation to time 4 5 4   Orientation to Place 5 4 5   Registration 3 3 3   Attention/ Calculation 3 3 2   Recall 3 3 2   Language- name 2 objects 2 2 2   Language- repeat 1 1 1   Language- follow 3 step command 3 3 3   Language- read & follow direction 1 1 1   Write a sentence 1 1 1   Copy design 1 1 1   Copy design-comments named 10 animals - -  Total score 27 27 25    Current Outpatient Medications  Medication Sig Dispense Refill  . acetaminophen (TYLENOL) 500 MG tablet Take 1,000 mg by mouth every 6 (six) hours as needed for mild pain or moderate pain.    Marland Kitchen amitriptyline (ELAVIL) 100 MG tablet Take 2 tablets (200 mg total) by mouth at bedtime. 60 tablet 0  . atorvastatin (LIPITOR) 40 MG tablet TAKE 1 TABLET BY MOUTH EVERY DAY 90 tablet 0  . hydrochlorothiazide (HYDRODIURIL) 25 MG tablet TAKE 1 TABLET BY MOUTH EVERY DAY 90 tablet 3  . Lacosamide (VIMPAT) 150 MG TABS Take 1 tablet (150 mg total) by mouth 2 (two) times daily. 60 tablet 5  . losartan (COZAAR) 25 MG tablet TAKE 2 TABLETS BY MOUTH EVERY DAY 180 tablet 3  . metoprolol succinate (TOPROL-XL) 100 MG 24 hr tablet Take 2 tablets (200 mg total) by mouth at bedtime. 180 tablet 3  . Multiple Vitamin (MULTIVITAMIN WITH MINERALS) TABS tablet Take 1 tablet by mouth daily.    . polyethylene glycol powder ([employer] PURELAX) powder TAKE 17 GRAMS 2 TIMES A DAY AS NEEDED FOR CONSTIPATION. MIX INTO 8 OF FLUID AND DRINK (Patient taking differently: Take 17 g by mouth daily. Mix into 8 oz of fluid and drink) 510 g 1  . QUEtiapine (SEROQUEL) 200 MG tablet Take 2 tablets (400 mg total) by mouth at bedtime. 60 tablet 0  . testosterone cypionate (DEPOTESTOSTERONE CYPIONATE) 200 MG/ML injection Inject 0.75 mLs (150 mg total) into the muscle every 14 (fourteen) days.    Marland Kitchen testosterone cypionate (DEPOTESTOSTERONE CYPIONATE) 200 MG/ML injection SMARTSIG:1 Milliliter(s) IM Every 2 Weeks    . zolpidem (AMBIEN) 5 MG tablet Take 1 tablet (5 mg total) by mouth at  bedtime as needed for sleep. 15 tablet 1   No current facility-administered medications for this visit.   Mr. Kolesnik is taking the potentially brain impairing medications: Seroquel, amitryptaline, lacosamide, and ambien.   Family History  Problem Relation Age of Onset  . Stroke Father        Living at 16  . Stroke Mother        Stroke in late 27's. Died in her 60's  . Alcohol abuse Brother   . Alcohol abuse Brother   . Diabetes Brother   . Colon cancer Neg Hx   .  Esophageal cancer Neg Hx   . Stomach cancer Neg Hx   . Rectal cancer Neg Hx     There is no  family history of dementia. There is a family history of suicide, a great uncle "shot himself in the head," which [name] attributed to alcohol misuse and depression. He had an older brother who died related to complications of a blood transfusion at a younger age. He has a younger brother, who is still living and is reportedly in good health and has no mental health issues.   Psychosocial History  Developmental, Educational and Employment History: [name] stated that he did "ok" in school and denied ever being held back or having any learning problems. He went to college after that, in [place], but then transferred to [employer] where he earned a BS in [employer]. He left school after completing a BS because he became symptomatic of his mental illness. He stated that he has never really worked full time related to his mental health issues and has been on disability status for approximately 30 years.   Psychiatric History: [name] carries a diagnosis of Schizaffective disorder, Bipolar type, and by chart review has an approximately 40 year history of depression (see note from Dr. Vikki Ports 09/23/2018). He has approximately 15 inpatient admissions for psychiatric reasons, most recently in 2016 when he had thoughts of potentially committing suicide by inhaling helium. On interview, [name] stated that he has  attempted to end his life, the last time was "probably over a year ago," but he didn't go to the hospital and was never taken to the ED. He stated that he attempted to overdose but then felt better when the ambulance got there. He is currently following with Dr. Adele Schilder (although I see notes in the chart that he was planning on transferring his care). He stated that he is planning to continue with Dr. Adele Schilder. He has some involvement in psychotherapy and was engaged off and on for 30 years, which he feels like "helped some" although he has a hard time now that he is feeling numb. He stated that his symptoms started in [place], when he felt as though he had "entered the kingdom of heaven" through transcendental meditation. He was vague regarding his symptom timeline, however. He stated that he never heard voices until several years ago but also commented that "It probably was before that" but he can't remember. He engaged in self-castration approximately 25 years ago to "help with the loneliness," he thought if he destroyed his libido then he wouldn't be as upset about being alone. Baseline paranoia and worries about other people are noted as per Dr. Marguerite Olea documentation.   Substance Use History: [name] denied any drinking or drug use. He does not currently use tobacco and denied ever using tobacco.   Relationship History and Living Cimcumstances: [name] currently lives with a partner, [name], and they have been together approximately 4 years per his report. He has no children. He has never been married.   Mental Status and Behavioral Observations  Sensorium/Arousal: The patient's level of arousal was awake and alert.  Orientation: [name] was alert and oriented to person, place, and to situation. He was oriented to month and year but could not recall the date.  Appearance: Dressed in appropriate, casual clothing, with reasonable grooming and hygiene. Behavior: [name] was pleasant and  appropriate Speech/language: Normal in rate, rhythm, and volume.  Gait/Posture: Not formally observed Movement: No tremor  or other motor abnormalities noted on observation.  Social Comportment: Appropriate within social norms Mood: "Emotionally numb" Affect: Blunted Thought process/content: Mr. Gelb frequently lost hist train of thought and seemed to have a hard time focusing. He was not, however, frankly disorganized, there was no loosening of associations, flight of ideas, or other signs to suggest an active psychotic or manic process. His thought content was appropriate to the topics discussed. He was an inconsistent historian and often needed to be reminded of details to construct an accurate personal timeline.  Safety: Mr. Cowin denied any thoughts of harming himself or others. He did endorse an item positively that "sometimes I feel like I would be better off dead or have thoughts of hurting myself" but clarified they are just thoughts, he has no intent, and he has no plan.   Insight: Unclear  Montreal Cognitive Assessment  02/08/2020  Visuospatial/ Executive (0/5) 2  Naming (0/3) 3  Attention: Read list of digits (0/2) 2  Attention: Read list of letters (0/1) 1  Attention: Serial 7 subtraction starting at 100 (0/3) 2  Language: Repeat phrase (0/2) 2  Language : Fluency (0/1) 1  Abstraction (0/2) 1  Delayed Recall (0/5) 3  Orientation (0/6) 5  Total 22  Adjusted Score (based on education) 22   Test Procedures  Wide Range Achievement Test - 4 Word Reading Wechsler Adult Intelligence Scale - IV  Digit Span  Arithmetic  Symbol Search  Coding Neuropsychological Assessment Battery  List Learning  Story Learning  Naming Repeatable Battery for the Assessment of Neuropsychological Status (Form A)  Figure Copy  Judgment of Line Orientation  Figure Recall ACS Word Choice The Dot Counting Test A Random Letter Test Controlled Oral Word Association (F-A-S) Semantic Fluency  (Animals) Trail Making Test A & B Modified Wisconsin Card Sorting Test Patient Health Questionnaire - 9 GAD-7 Rey 15-Item and Recognition Trial  Plan  LEBARRON WILENSKY was seen for a psychiatric diagnostic evaluation and neuropsychological testing. He has numerous ongoing issues that may pose a problem for driving capacity including medication usage, shizoaffective disorder, and a seizure disorder. His story is not particularly concerning for a neurodegenerative condition and seems more consistent with chronic major mental illness and medication side effects, all of which can and do significantly impact cognitive functioning.   Viviano Simas Nicole Kindred, PsyD, [place] Clinical Neuropsychologist  Informed Consent and Coding/Compliance  Risks and benefits of the evaluation were discussed with the patient as were the limits of confidentiality. I conducted a clinical interview and neuropsychological testing (more than two tests) with [name] and Lamar Benes, B.S. (Technician) assisted me in administering additional test procedures. The patient was able to tolerate the testing procedures and the patient (and/or family if applicable) is likely to benefit from further follow up to receive the diagnosis and treatment recommendations, which will be rendered at the next encounter. Billing below reflects technician time, my direct face-to-face time with the patient, time spent in test administration, and time spent in professional activities including but not limited to: neuropsychological test interpretation, integration of neuropsychological test data with clinical history, report preparation, treatment planning, care coordination, and review of diagnostically pertinent medical history or studies.   Services associated with this encounter: Clinical Interview [phone]) plus 60 minutes [id number]; Neuropsychological Evaluation by Professional)  180 minutes [id number]; Neuropsychological Evaluation by Professional,  Adl.) 30 minutes [id number]; Test Administration by Professional) 30 minutes [id number]; Neuropsychological Testing by Technician) 85 minutes [id number]; Neuropsychological Testing by Technician, Adl.)

## 2020-02-11 NOTE — Progress Notes (Signed)
[facility]  Patient Name: [name] MRN: [id number] Date of Birth: 02-11-1956 Age: 64 y.o. Education: 16 years  Measurement properties of test scores: IQ, Index, and Standard Scores (SS): Mean = 100; Standard Deviation = 15 Scaled Scores (Ss): Mean = 10; Standard Deviation = 3 Z scores (Z): Mean = 0; Standard Deviation = 1 T scores (T); Mean = 50; Standard Deviation = 10  TEST SCORES:    Note: This summary of test scores accompanies the interpretive report and should not be considered in isolation without reference to the appropriate sections in the text. Test scores are relative to age, gender, and educational history as available and appropriate.   Performance Validity        ACS: Raw  Descriptor      Word Choice 42 Below Expectation      Rey 15 and Recognition: Raw  Descriptor      Free Recall 6 Below Expectation      Recognition 9 ---      False Positives 3 ---      Combined Score 12 Below Expectation      The Dot Counting Test: 8 Within Expectation      Embedded Measures:        WAIS-IV Reliable Digit Span 10 Within Expectation      WAIS-IV Reliable Digit Span Revised 10 Below Expectations      Mental Status Screening     Total Score   MoCA 22       Expected Functioning        Wide Range Achievement Test: Standard/Scaled Score Percentile      Word Reading 94 34      Attention/Processing Speed        Wechsler Adult Intelligence Scale - IV: Standard/Scaled Score Percentile  Working Memory Index 74 4      Digit Span 7 16          Digit Span Forward 13 84          Digit Span Backward 9 37          Digit Span Sequencing 2 <1      Arithmetic 4 2  Processing Speed Index 79 8      Symbol Search 7 16      Coding 5 5      Language        Neuropsychological Assessment Battery (Language Module, Form 1): T-score Percentile     Naming 28 2      Verbal Fluency:         Controlled Oral Word Association (F-A-S) 37 9   Semantic Fluency (Animals) 16 <1      Memory:        Neuropsychological Assessment Battery (Memory Module, Form 1): T-score Percentile      List Learning            List A Immediate Recall  14/36 (32) 4          List B Immediate Recall 5/12 (54) 66          List A Short Delayed Recall 2/12 (23) <1          List A Long Delayed Recall 2/12 (26) 1          List A Long Delayed Yes/No Recognition Hits 7/12 (21) <1          List A Long Delayed Yes/No Recognition False Alarms 7/12 (33) 5  List A Recognition Discriminability Index 0 (16) <1      Story Learning            Immediate Recall 17/80 (19) <1          Delayed Recall 1/40 (29) 2      Repeatable Battery for the Assessment of Neuropsychological Status (Form A): Scaled Score Percentile      Figure Recall 3 1      Visuospatial/Constructional Functioning            Repeatable Battery for the Assessment of Neuropsychological Status (Form A): Standard/Scaled Score Percentile      Visuospatial/Constructional Index 109 73          Figure Copy 11 63          Judgment of Line Orientation --- 51-75      Executive Functioning        Modified [place] Card Sorting Test (MWCST): Standard/T-Score Percentile      Number of Categories Correct 34 5      Number of Perseverative Errors 31 3      Number of Total Errors 39 14      Percent Perseverative Errors 37 10  Executive Function Composite 71 3      Trail Making Test: T-Score Percentile      Part A 28 ---      Part B 21 ---      Rating Scales         Raw Score Percentile  Patient Health Questionnaire - 9 21 "Severe"  GAD-7 13 "Moderate"    Peter V. Nicole Kindred PsyD, [place] Clinical Neuropsychologist

## 2020-02-15 ENCOUNTER — Other Ambulatory Visit: Payer: Self-pay

## 2020-02-15 ENCOUNTER — Telehealth (INDEPENDENT_AMBULATORY_CARE_PROVIDER_SITE_OTHER): Payer: Medicare Other | Admitting: Counselor

## 2020-02-15 DIAGNOSIS — G3184 Mild cognitive impairment, so stated: Secondary | ICD-10-CM

## 2020-02-15 DIAGNOSIS — F250 Schizoaffective disorder, bipolar type: Secondary | ICD-10-CM

## 2020-02-15 DIAGNOSIS — F0670 Mild neurocognitive disorder due to known physiological condition without behavioral disturbance: Secondary | ICD-10-CM

## 2020-02-15 NOTE — Progress Notes (Signed)
[facility]  I met with [name] to review the findings resulting from his neuropsychological evaluation. Since the last appointment, things have been about the same. I was able to collect additional collateral from his significant other, [name], who presented to the appointment. She was also a difficult historian and stated that [name] has a lot of cognitive problems although it sounds like from her description that it is more differences of opinion. For instance she thinks he does things that make "no sense," such as not taking his cat to the [facility]. He clarified that he has already spent $500 on the cat and therefore cannot spend any more money. She didn't raise any new or very concerning symptoms not already revealed during the initial interview. She said she is "kind of half way" worried about him returning to driving but it isn't clear to her he is unsafe to drive. Time was spent reviewing the impressions and recommendations that are detailed in the evaluation report. I underscored with [name] that neuropsych testing alone is not sufficient for answering the question as to whether he is safe to drive, although it can help inform the overall evaluation. I explained to him that an on the road driving test would probably be prudent given his marginal test findings, if he does wish to return to driving.  Interventions provided during this encounter included psychoeducation, activity planning (discussed behavioral activation), and other topics as reflected in the patient instructions. I took time to explain the findings and answer all the patient's questions. I encouraged [name] to contact me should they have any further questions or if further follow up is desired.   Current Medications and Medical History   Current Outpatient Medications  Medication Sig Dispense Refill  . acetaminophen (TYLENOL) 500 MG tablet Take 1,000 mg by mouth every 6 (six) hours  as needed for mild pain or moderate pain.    Marland Kitchen amitriptyline (ELAVIL) 100 MG tablet Take 2 tablets (200 mg total) by mouth at bedtime. 60 tablet 0  . atorvastatin (LIPITOR) 40 MG tablet TAKE 1 TABLET BY MOUTH EVERY DAY 90 tablet 0  . hydrochlorothiazide (HYDRODIURIL) 25 MG tablet TAKE 1 TABLET BY MOUTH EVERY DAY 90 tablet 3  . Lacosamide (VIMPAT) 150 MG TABS Take 1 tablet (150 mg total) by mouth 2 (two) times daily. 60 tablet 5  . losartan (COZAAR) 25 MG tablet TAKE 2 TABLETS BY MOUTH EVERY DAY 180 tablet 3  . metoprolol succinate (TOPROL-XL) 100 MG 24 hr tablet Take 2 tablets (200 mg total) by mouth at bedtime. 180 tablet 3  . Multiple Vitamin (MULTIVITAMIN WITH MINERALS) TABS tablet Take 1 tablet by mouth daily.    . polyethylene glycol powder ([employer] PURELAX) powder TAKE 17 GRAMS 2 TIMES A DAY AS NEEDED FOR CONSTIPATION. MIX INTO 8 OF FLUID AND DRINK (Patient taking differently: Take 17 g by mouth daily. Mix into 8 oz of fluid and drink) 510 g 1  . QUEtiapine (SEROQUEL) 200 MG tablet Take 2 tablets (400 mg total) by mouth at bedtime. 60 tablet 0  . testosterone cypionate (DEPOTESTOSTERONE CYPIONATE) 200 MG/ML injection Inject 0.75 mLs (150 mg total) into the muscle every 14 (fourteen) days.    Marland Kitchen testosterone cypionate (DEPOTESTOSTERONE CYPIONATE) 200 MG/ML injection SMARTSIG:1 Milliliter(s) IM Every 2 Weeks    . zolpidem (AMBIEN) 5 MG tablet Take 1 tablet (5 mg total) by mouth at bedtime as needed for sleep. 15 tablet 1   No  current facility-administered medications for this visit.    Patient Active Problem List   Diagnosis Date Noted  . Prediabetes 10/05/2019  . Acute metabolic encephalopathy 95/62/1308  . Encephalopathy acute 01/24/2019  . Closed tibia fracture 11/25/2018  . Fracture of tibial shaft, left, closed 11/25/2018  . Unintentional weight loss 04/07/2018  . Syncope and collapse 12/13/2017  . Memory difficulty 10/25/2017  . Presbyacusia 07/26/2017  . Palpitations 07/01/2017  .  Acute bronchiolitis due to human metapneumovirus   . Mild sleep apnea 06/15/2016  . Actinic keratosis 06/15/2016  . Nocturnal dyspnea 02/03/2016  . Skin lesion of face 02/03/2016  . Paresthesia of both hands 09/16/2015  . Phenytoin toxicity   . TIA (transient ischemic attack) 07/29/2015  . Vertigo 07/29/2015  . Schizoaffective disorder, depressive type with good prognostic features ([place]) 07/13/2015  . Verruca 05/18/2015  . Seborrheic dermatitis of scalp 05/18/2015  . MDD (major depressive disorder), recurrent severe, without psychosis ([place])   . OCD (obsessive compulsive disorder)   . Seizures ([place])   . Schizoaffective disorder, depressive type ([place])   . Orthostatic hypotension   . Fall   . Seizure ([place]) 11/18/2014  . Schizoaffective disorder, unspecified type ([place])   . Essential hypertension   . Overdose of beta-adrenergic antagonist drug 10/22/2014  . Overdose   . Nocturnal enuresis 03/23/2014  . Benzodiazepine dependence, continuous ([place]) 01/27/2014  . Severe major depression ([place]) 01/21/2014  . Frozen shoulder syndrome 09/20/2013  . Somnolence 09/20/2013  . Schizoaffective disorder ([place]) 09/20/2013  . Presbyopia 04/25/2012  . Androgen deficiency 09/07/2011  . Constipation, chronic 09/07/2011  . Other specified disorders of liver 12/30/2008  . Overweight(278.02) 09/08/2008  . Hyperlipidemia 01/28/2008  . Seizure disorder ([place]) 01/28/2008  . OSTEOPENIA 07/01/2007    Mental Status and Behavioral Observations  [name] was available for this telephonic video appointment and was attended by his wife [name]. The appointment was originally scheduled for video but he seemed unaware of that, so we conducted the appointment by phone instead. Self-reported mood was "ok" and his affect was mainly neutral as assessed by vocal quality. [name] thought process was logical, linear, and goal-oriented and there was no loosening of associations or other signs to suggest psychosis. His  thought content was appropriate to the topics that were discussed. No safety concerns were identified during today's encounter, such as thoughts of harming himself or others.   Plan  Feedback provided regarding the patient's neuropsychological evaluation. [name] was encouraged to contact me if any questions arise or if further follow up is desired. I would recommend that, given his cognitive test findings, he pursue on the road testing if he still wishes to return to driving. I defer to his neurological and psychiatric care providers regarding seizure disorder and psychiatric condition, which may also impact his suitability.   Viviano Simas Nicole Kindred, PsyD, ABN Clinical Neuropsychologist  Service(s) Provided at This Encounter: 50 minutes [phone]; Conjoint therapy with patient present)

## 2020-02-15 NOTE — Patient Instructions (Addendum)
Your performance and presentation on assessment today were consistent with issues that can largely be summed up as reflecting problems with executive control and cognitive efficiency. Much like the conductor of an orchestra coordinates multiple instruments to make music, executive capacities coordinate other lower-order skills (e.g., movement, language, attention) to form complex human behaviors. Individuals with executive control problems are often capable of doing most of the things they did before they were having problems, but they may not do so as effortlessly, efficiently, and consistently. These difficulties often manifest as problems tracking information, multitasking, and paying attention. Executive control problems often result in cognitive inefficiency and can present as "memory problems," because they decrease encoding and spontaneous retrieval of information.   In your case, I think it is likely that your memory and thinking difficulties are multifactorial and related to your depression and anxiety, suboptimal sleep hygiene, and also aging in the setting of severe mental illness (which can be conceptualized as accelerated brain aging). Cognitive difficulties are common in aged individuals with psychiatric conditions such as bipolar disorder, schizophrenia, and schizoaffective disorder.   As far as return to driving, I think that there is reason to be cautious from a neuropsychological perspective. You had difficulties consistently maintaining attentiveness to the testing tasks while they were administered, and that is under a tightly controlled relatively distraction free environment. This is with the important caveat that neuropsychological tests are not very predictive of driving safety and actual on the road performance. Therefore, an on the road driving test may be the best way to determine if you are safe to drive. You might consider  [email or web address].[email or web address], which is a Engineer, agricultural offering driving rehabilitation services. Alternatively, the [employer] in [place], [place] at [phone].Another option would be through [facility]; however, the latter would likely require a referral from a medical doctor. [facility] can be reached directly at [phone].  There is a significant research base and evidence of effectiveness for something called "behavioral activation," which is a fancy way of saying that you should increase your activity level. In general, people do not feel as happy or do as well when they are not doing much. This can include things like getting out for walks, re-engaging in hobbies, spending time with family or friends, or learning a new hobby. It's not so important what you do as that you enjoy it and stick with it. Depression can start a vicious cycle where you are not doing a lot because you don't feel well, which leads to less things to be excited and happy about, and thus more depression and behavioral avoidance. It can be hard to change this pattern once it has started but most people find that they feel better when they start doing more even if they don't enjoy it at first.   There are few things as disruptive to brain functioning as not getting a good night's sleep. For sleep, I recommend against using medications, which can have lingering sedating effects on the brain and rob your brain of restful REM sleep. Instead, consider trying some of the following sleep hygiene recommendations. They may not work at once and may take effort, but the effort you spend is likely to be rewarded with better sleep eventually:   Stick to a sleep schedule of the same bedtime and wake time even on the weekends, which can help to regulate your body's internal clock so that you fall asleep and stay asleep.    Practice a relaxing bedtime ritual (  conducted away from bright lights) which will help separate your sleep from stimulating activities and prepare your body to fall asleep when you go to bed.   Avoid naps, especially in the afternoon.   Evaluate your room and create conditions that will promote sleep such as keeping it cool (between 60 - 67 degrees), quiet, and free from any lights. Consider using blackout curtains, a "white noise" generator, or fan that will help mask any noises that might prevent you from going to sleep or awaken you during the night.   Sleep on a comfortable mattress and pillows.   Avoid bright light in the evening and excessive use of portable electronic devices right before bed that may contain light frequencies that can contribute to sleep problems.   Avoid alcohol, cigarettes, or heavy meals in the evening. If you must eat, consume a light snack 45 minutes before bed.   Use your bed only for sleep to strengthen the association between your bed and sleep.   If you can't go to sleep within 30 minutes, go into another room and do something relaxing until you feel tired. Then, come back and try to go to sleep again for 30 minutes and repeat until sleep is achieved.   Some people find over the counter melatonin to be helpful for sleep, which you could discuss with a pharmacist or prescribing provider.   Exercise is one of the best medicines for promoting health and maintaining cognitive fitness at all stages in life. Exercise probably has the largest documented effect on brain health and performance of any intervention. Studies have shown that even previously sedentary individuals who start exercising as late as age 15 show a significant survival benefit as compared to their non-exercising peers. In the [place], the current guidelines are for 30 minutes of moderate exercise per day, but increasing your activity level less than that may also be helpful. You do not have  to get your 30 minutes of exercise in one shot and exercising for short periods of time spread throughout the day can be helpful. Go for several walks, learn to dance, or do something else you enjoy that gets your body moving. Of course, if you have an underlying medical condition or there is any question about whether it is safe for you to exercise, you should consult a medical treatment provider prior to beginning exercise.

## 2020-02-15 NOTE — Progress Notes (Signed)
[facility]  Patient Name: [name] MRN: [id number] Date of Birth: January 30, 1956 Age: 64 y.o. Education: 16 years  Clinical Impressions  [name] is a 64 y.o., left-hand dominant, partnered man with a history of schizoaffective disorder since his 5s, seizure disorder for several decades, and memory problems more recently. He was a difficult historian but based on his report and chart review, it seems that his cognitive difficulties started around 5 years ago after a string of seizures and there are reports in the chart of him being fairly debilitated episodically related to his mental illness (see phone note from Rossie Muskrat, 10/22/2017). He is still managing his finances and most instrumental activities of daily living although his functional level is low related to his mood and he will often spend much of the day in bed. He was referred to inform return to driving.   On neuropsychological testing there is suggestion of significant cognitive diminishment that can be summed up as primarily reflecting difficulties with cognitive efficiency and executive control. Performance was unusually low at an index level on measures of attention and processing efficiency and executive findings were unusually to extremely low (for the most part). He demonstrated memory encoding issues but is retaining some information across time and I think his memory problems are mainly secondary to interference from executive and attentional factors. He reported severe levels of depressive symptoms and moderate levels of anxiety symptoms.   From a neuropsychological perspectiver, Mr. Scattergood is demonstrating a mild level neurocognitive disorder. Nevertheless, his prominent difficulties with attentiveness and executive control are a concern for his ability to safely operate a motor vehicle. He also failed 2/2 measures recommended by the [facility] for assessment of impaired drivers.  Etiologically I think his cognitive problems are most likely related to medication side effects, poor sleep hygiene, distraction related to depression and anxiety, and also aging in the setting of severe and poorly controlled mental illness. For consideration also are the extent to which his seizure disorder and mental health issues may impact his ability to drive safely, which are deferred to his neurologic and psychiatric care providers.   Diagnostic Impressions: Mild neurocognitive disorder due to multiple etiologies Schizoaffective disorder, bipolar type, by history Medication side effect Seizure disorder  Recommendations to be discussed with patient  Your performance and presentation on assessment today were consistent with issues that can largely be summed up as reflecting problems with executive control and cognitive efficiency. Much like the conductor of an orchestra coordinates multiple instruments to make music, executive capacities coordinate other lower-order skills (e.g., movement, language, attention) to form complex human behaviors. Individuals with executive control problems are often capable of doing most of the things they did before they were having problems, but they may not do so as effortlessly, efficiently, and consistently. These difficulties often manifest as problems tracking information, multitasking, and paying attention. Executive control problems often result in cognitive inefficiency and can present as "memory problems," because they decrease encoding and spontaneous retrieval of information.   In your case, I think it is likely that your memory and thinking difficulties are multifactorial and related to your depression and anxiety, suboptimal sleep hygiene, and also aging in the setting of severe mental illness (which can be conceptualized as accelerated brain aging). Cognitive difficulties are common in aged individuals with psychiatric conditions such as bipolar disorder,  schizophrenia, and schizoaffective disorder.   As far as return to driving, I think that there is reason  to be cautious from a neuropsychological perspective. You had difficulties consistently maintaining attentiveness to the testing tasks while they were administered, and that is under a tightly controlled relatively distraction free environment. This is with the important caveat that neuropsychological tests are not very predictive of driving safety and actual on the road performance. Therefore, an on the road driving test may be the best way to determine if you are safe to drive. You might consider [email or web address], which is a Engineer, agricultural offering driving rehabilitation services. Alternatively, the [employer] in [place], [place] at [phone]. Another option would be through [facility]; however, the latter would likely require a referral from a medical doctor. [facility] can be reached directly at [phone].   There is a significant research base and evidence of effectiveness for something called "behavioral activation," which is a fancy way of saying that you should increase your activity level. In general, people do not feel as happy or do as well when they are not doing much. This can include things like getting out for walks, re-engaging in hobbies, spending time with family or friends, or learning a new hobby. It's not so important what you do as that you enjoy it and stick with it. Depression can start a vicious cycle where you are not doing a lot because you don't feel well, which leads to less things to be excited and happy about, and thus more depression and behavioral avoidance. It can be hard to change this pattern once it has started but most people find that they feel better when they start doing more even if they don't enjoy it at first.   There are few things as  disruptive to brain functioning as not getting a good night's sleep. For sleep, I recommend against using medications, which can have lingering sedating effects on the brain and rob your brain of restful REM sleep. Instead, consider trying some of the following sleep hygiene recommendations. They may not work at once and may take effort, but the effort you spend is likely to be rewarded with better sleep eventually:  . Stick to a sleep schedule of the same bedtime and wake time even on the weekends, which can help to regulate your body's internal clock so that you fall asleep and stay asleep.  . Practice a relaxing bedtime ritual (conducted away from bright lights) which will help separate your sleep from stimulating activities and prepare your body to fall asleep when you go to bed.  . Avoid naps, especially in the afternoon.  . Evaluate your room and create conditions that will promote sleep such as keeping it cool (between 60 - 67 degrees), quiet, and free from any lights. Consider using blackout curtains, a "white noise" generator, or fan that will help mask any noises that might prevent you from going to sleep or awaken you during the night.  . Sleep on a comfortable mattress and pillows.  . Avoid bright light in the evening and excessive use of portable electronic devices right before bed that may contain light frequencies that can contribute to sleep problems.  . Avoid alcohol, cigarettes, or heavy meals in the evening. If you must eat, consume a light snack 45 minutes before bed.  . Use your bed only for sleep to strengthen the association between your bed and sleep.  . If you can't go to sleep within 30 minutes, go into another room and do something relaxing until you feel tired. Then, come back  and try to go to sleep again for 30 minutes and repeat until sleep is achieved.  . Some people find over the counter melatonin to be helpful for sleep, which you could discuss with a pharmacist or  prescribing provider.   Test Findings  Test scores are summarized in additional documentation associated with this encounter. Test scores are relative to age, gender, and educational history as available and appropriate. There were minor concerns about performance validity as scores fell below expectations on several standalone measures of performance validity and on one embedded indicator. In the context of Mr. Pawlus presentation and overall performance, I think this likely reflects his difficulties with attentiveness and concentration and renders test findings a conservative estimate of his best performance capabilities but does not frankly invalidate his scores.    General Intellectual Functioning/Achievement:  Performance was average on single word reading, which presents as a reasonable standard of comparison for Mr. Czaja cognitive test performance.   Attention and Processing Efficiency: Performance on indicators of attention and processing efficiency presented at unusually low levels on the Working Memory Index and Processing Speed Index of the WAIS-IV. With respect to attention, digit repetition forward was high average, digit repetition backward was averge, and digit span resequencing was extremely low. Performance was extremely low when solving arithmetical word problems without paper and pencil.   Performance on indicators of processing efficiency was low average on one measure of efficient visual scanning and efficient visual matching. Timed number-symbol coding was unusually low. Mr. Bazurto also demonstrated an extremely low score on simple numeric sequencing, which is a very easy task.   Language: Mr. Arola generated a low score on visual object confrontation naming; however, many of the items he missed were low frequency items and due to the psychometric properties of the task, this may overestimate his difficulties (he only missed 4/31 items). Performance was variable on  indicators of verbal fluency with weak low average (almost unusually low) fluency in response to the letters F-A-S and extremely low animal fluency. Part of this difference may be because animal fluency is relatively more dependent on speed of processing than F-A-S fluency, which was an issue for Mr. Wix.   Visuospatial Function: Performance fell at a reasonable average level on the visuospatial/constructional index of the RBANS. Figure copy was average and judgment of angular line orientations was average to high average.   Learning and Memory: Performance on indicators of learning and memory suggested significant memory encoding difficulties and susceptibility to interference. Nevertheless, Mr. Cuebas did retain some information across time and does not have a complete and total storage deficit. In the verbal realm, immediate recall for a word list was unusually low. After being read a distractor list, he recalled only 2 of the words he had learned but then did retain all of them on long delayed recall. Performance was still extremely low relative to his age and education cohort. Attentiveness got in the way of his short story learning as he did not recall any of the story after one repetition and then remembered more details on the second repetition, but his score was still extremely low for immediate recall, overall. He retained only one detail on delayed recall, generating an impaired score.   In the visual realm, delayed recall of a modestly complex figure was extremely low.   Executive Functions: Performance across the test battery and qualitative observations suggest fairly significant executive control difficulties. On dedicated measures such as the [place] Task, his [employer]  Function Composite was unusually low, with an unusually low categories score and unusually low perseverative errors score. Alternating sequencing of numbers and letters of the alphabet was  extremely low. Clock drawing was consistent with "mild impairment" with missing and added numbers to the clock face and slightly incorrect placement of the hands at 10:10.   Rating Scale(s): Mr. Papini reported severe levels of depressive symptoms and moderate levels of anxiety symptoms on self-rating scales.   Viviano Simas Nicole Kindred PsyD, [place] Clinical Neuropsychologist

## 2020-02-17 ENCOUNTER — Telehealth: Payer: Self-pay | Admitting: Neurology

## 2020-02-17 NOTE — Telephone Encounter (Signed)
Mr. Kolterman was sent for neuropsychological testing and was seen by Dr. Nicole Kindred.  His case seems more consistent with chronic major mental illness and medication side effect, not particularly concerning for a neurodegenerative condition.

## 2020-03-04 ENCOUNTER — Ambulatory Visit (HOSPITAL_COMMUNITY): Payer: Medicare Other | Admitting: Psychiatry

## 2020-03-04 ENCOUNTER — Other Ambulatory Visit: Payer: Self-pay

## 2020-03-07 ENCOUNTER — Telehealth: Payer: Self-pay

## 2020-03-08 ENCOUNTER — Encounter (HOSPITAL_COMMUNITY): Payer: Self-pay | Admitting: Psychiatry

## 2020-03-08 ENCOUNTER — Other Ambulatory Visit: Payer: Self-pay

## 2020-03-08 ENCOUNTER — Ambulatory Visit (HOSPITAL_COMMUNITY): Payer: Medicare Other | Admitting: *Deleted

## 2020-03-08 ENCOUNTER — Ambulatory Visit (INDEPENDENT_AMBULATORY_CARE_PROVIDER_SITE_OTHER): Payer: Medicare Other | Admitting: Psychiatry

## 2020-03-08 DIAGNOSIS — F411 Generalized anxiety disorder: Secondary | ICD-10-CM | POA: Diagnosis not present

## 2020-03-08 DIAGNOSIS — F250 Schizoaffective disorder, bipolar type: Secondary | ICD-10-CM | POA: Diagnosis not present

## 2020-03-08 DIAGNOSIS — F332 Major depressive disorder, recurrent severe without psychotic features: Secondary | ICD-10-CM

## 2020-03-08 MED ORDER — AMITRIPTYLINE HCL 100 MG PO TABS: 200.0000 mg | ORAL_TABLET | Freq: Every day | ORAL | 1 refills | Status: DC

## 2020-03-08 MED ORDER — QUETIAPINE FUMARATE 400 MG PO TABS: 400.0000 mg | ORAL_TABLET | Freq: Every day | ORAL | 1 refills | Status: DC

## 2020-03-08 NOTE — Progress Notes (Signed)
Virtual Visit via Telephone Note  I connected with [name] on 03/08/20 at  3:00 PM EDT by telephone and verified that I am speaking with the correct person using two identifiers.   I discussed the limitations, risks, security and privacy concerns of performing an evaluation and management service by telephone and the availability of in person appointments. I also discussed with the patient that there may be a patient responsible charge related to this service. The patient expressed understanding and agreed to proceed.   History of Present Illness: Patient was evaluated by phone session.  He is in the process of neuropsych testing.  We had cut down his Seroquel and he noticed improvement in his energy level.  He also bought a stationary bike and now he goes outside for walk.  He reported his personal hygiene is also improved.  He is less depressed and less sedated but sometimes he feel does not sleep as good.  He is wondering if he can start taking Ambien 5 mg.  He has residual paranoia and anxiety but denies any recent suicidal thoughts or homicidal thoughts.  He denies any hallucination.  His appetite is okay and his weight is unchanged from the past.  He has no tremors, shakes.   Past Psychiatric History:Reviewed. H/Odepression,paranoia, suicidal attempt, delusions and anxiety.H/Oat least 15inpatient. H/O cutting his testicles because does not want to remain depressed. Thoughts of inhaling helium to end his life. Last inpatient August 2016. D/Con Seroquel 900 mg and amitriptyline 400 mgat bedtime.Seen [name]. SSRI caused reactions. Triednortriptyline, Haldol, Geodon, Seroquel, lithium, Prozac, Wellbutrin, Paxil,Klonopin,Ativan, Lexapro,Zoloft, abilifyand Lamictal. No H/Omania or drug use.   Psychiatric Specialty Exam: Physical Exam  Review of Systems  There were no vitals taken for this visit.There is no height or weight on file to calculate BMI.  General  Appearance: NA  Eye Contact:  NA  Speech:  Clear and Coherent and Slow  Volume:  Decreased  Mood:  Euthymic  Affect:  NA  Thought Process:  Descriptions of Associations: Intact  Orientation:  Full (Time, Place, and Person)  Thought Content:  Paranoid Ideation  Suicidal Thoughts:  No  Homicidal Thoughts:  No  Memory:  Immediate;   Good Recent;   Fair Remote;   Fair  Judgement:  Intact  Insight:  Present  Psychomotor Activity:  NA  Concentration:  Concentration: Fair and Attention Span: Fair  Recall:  [employer] of Knowledge:  Good  Language:  Good  Akathisia:  No  Handed:  Right  AIMS (if indicated):     Assets:  Communication Skills Desire for Improvement Housing Social Support  ADL's:  Intact  Cognition:  WNL  Sleep:   improved      Assessment and Plan: Generalized anxiety disorder.  Schizoaffective disorder, bipolar type.  Patient doing better since we reduce Seroquel to 400 mg at bedtime.  He also had GeneSight testing today as his support person was concerned that he does not want to do anything and like to change the medication.  Since he had tried certainly medication we wanted to have GeneSight testing first.  Patient now believes that things are going well and he does not want to change his psychotropic medication.  I encouraged to use stationary bike and regular walking.  Recommend to try over-the-counter melatonin first to help insomnia if not then we will consider Ambien 5 mg as needed.  Continue Seroquel 400 mg at bedtime and amitriptyline 200 mg at bedtime.  Discussed  medication side effects and benefits.  He is not interested in therapy.  Recommended to call us back if is any question or any concern.  Follow-up in 6 weeks.    Follow Up Instructions:    I discussed the assessment and treatment plan with the patient. The patient was provided an opportunity to ask questions and all were answered. The patient agreed with the plan and demonstrated an understanding  of the instructions.   The patient was advised to call back or seek an in-person evaluation if the symptoms worsen or if the condition fails to improve as anticipated.  I provided 20 minutes of non-face-to-face time during this encounter.   Kathlee Nations, MD

## 2020-03-08 NOTE — Progress Notes (Signed)
Pt in clinic today for Genesight testing. Procedure and results, to come, explained. As well s any copay that be billed. Pt verbalizes understanding.

## 2020-03-17 ENCOUNTER — Encounter: Payer: Self-pay | Admitting: Family Medicine

## 2020-03-17 ENCOUNTER — Other Ambulatory Visit: Payer: Self-pay

## 2020-03-17 ENCOUNTER — Ambulatory Visit (INDEPENDENT_AMBULATORY_CARE_PROVIDER_SITE_OTHER): Payer: Medicare Other | Admitting: Family Medicine

## 2020-03-17 VITALS — BP 105/70 | HR 65 | Ht 71.0 in | Wt 209.8 lb

## 2020-03-17 DIAGNOSIS — G40909 Epilepsy, unspecified, not intractable, without status epilepticus: Secondary | ICD-10-CM | POA: Diagnosis not present

## 2020-03-17 DIAGNOSIS — E785 Hyperlipidemia, unspecified: Secondary | ICD-10-CM

## 2020-03-17 DIAGNOSIS — I10 Essential (primary) hypertension: Secondary | ICD-10-CM

## 2020-03-17 NOTE — Telephone Encounter (Signed)
error 

## 2020-03-17 NOTE — Assessment & Plan Note (Signed)
At goal. Blood pressure noted soft today, patient asymptomatic and reports it is higher later in the day. Continue current medications. Check lipids & BMET today.

## 2020-03-17 NOTE — Assessment & Plan Note (Signed)
Update lipid panel today. Continue atorvastatin.

## 2020-03-17 NOTE — Patient Instructions (Addendum)
Get the driving test as recommended by the psychologist Bring the report of that and the DMV forms to me at another appointment to fill out  [email or web address] to adjust your appointment   Checking kidneys and cholesterol today  Be well, Dr. Ardelia Mems

## 2020-03-17 NOTE — Progress Notes (Signed)
  Date of Visit: 03/17/2020   SUBJECTIVE:   HPI:  [name] presents today for follow up and form completion.  Form completion - has forms from [facility] he would like to have filled out today. Has not driven in over a year but would like to get his license back. Recently had neuropsychological testing done and the specialist recommended he get a driving test, which he has not yet done. Has not had any seizures in several years.  Hypertension - currently taking metoprolol XL 200mg  at night and losartan 25mg  daily. No longer on HCTZ. Denies lightheadedness or dizziness. States blood pressure tends to be lower in the morning and higher as the day progresses.  Hyperlipidemia - taking atorvastatin 40mg  daily. Would like lipids checked today.  He has an appointment upcoming to get his first COVID vaccine and is interested in moving it sooner if possible.  OBJECTIVE:   BP 105/70   Pulse 65   Ht 5\' 11"  (1.803 m)   Wt 209 lb 12.8 oz (95.2 kg)   SpO2 97%   BMI 29.26 kg/m  Gen: no acute distress, pleasant, cooperative HEENT: normocephalic, atraumatic  Heart: regular rate and rhythm, no murmur Lungs: clear to auscultation bilaterally normal work of breathing  Neuro: alert, speech normal, grossly nonfocal  ASSESSMENT/PLAN:   Health maintenance:  -encouraged to get COVID vaccine, provided website for rescheduling his appointment sooner  Essential hypertension At goal. Blood pressure noted soft today, patient asymptomatic and reports it is higher later in the day. Continue current medications. Check lipids & BMET today.  Hyperlipidemia Update lipid panel today. Continue atorvastatin.  Seizure disorder [facility]) Doing well without any recent seizures.  Driving form completion Per neuropsych testing needs driving test. Patient will arrange and bring documentation to me. Once he has that we can work on his [employer] paperwork.  [place]. Ardelia Mems, [place] than 30  minutes were spent on this encounter on the day of service, including pre-visit planning, actual face to face time, coordination of care, and documentation of visit.

## 2020-03-17 NOTE — Assessment & Plan Note (Signed)
Doing well without any recent seizures.

## 2020-03-18 LAB — LIPID PANEL
Chol/HDL Ratio: 3 ratio (ref 0.0–5.0)
Cholesterol, Total: 105 mg/dL (ref 100–199)
HDL: 35 mg/dL — ABNORMAL LOW (ref >39)
LDL Chol Calc (NIH): 38 mg/dL (ref 0–99)
Triglycerides: 199 mg/dL — ABNORMAL HIGH (ref 0–149)
VLDL Cholesterol Cal: 32 mg/dL (ref 5–40)

## 2020-03-18 LAB — BASIC METABOLIC PANEL
BUN/Creatinine Ratio: 17 (ref 10–24)
BUN: 17 mg/dL (ref 8–27)
CO2: 19 mmol/L — ABNORMAL LOW (ref 20–29)
Calcium: 9 mg/dL (ref 8.6–10.2)
Chloride: 105 mmol/L (ref 96–106)
Creatinine, Ser: 0.98 mg/dL (ref 0.76–1.27)
GFR calc Af Amer: 93 mL/min/{1.73_m2} (ref >59)
GFR calc non Af Amer: 81 mL/min/{1.73_m2} (ref >59)
Glucose: 111 mg/dL — ABNORMAL HIGH (ref 65–99)
Potassium: 4.6 mmol/L (ref 3.5–5.2)
Sodium: 142 mmol/L (ref 134–144)

## 2020-03-18 LAB — TSH: TSH: 3.43 u[IU]/mL (ref 0.450–4.500)

## 2020-03-18 LAB — VITAMIN D 25 HYDROXY (VIT D DEFICIENCY, FRACTURES): Vit D, 25-Hydroxy: 32 ng/mL (ref 30.0–100.0)

## 2020-03-21 ENCOUNTER — Ambulatory Visit (HOSPITAL_COMMUNITY): Payer: Medicaid Other | Admitting: Psychiatry

## 2020-03-28 ENCOUNTER — Encounter: Payer: Self-pay | Admitting: Family Medicine

## 2020-03-28 ENCOUNTER — Ambulatory Visit: Payer: Medicare Other | Attending: Internal Medicine

## 2020-03-28 DIAGNOSIS — Z23 Encounter for immunization: Secondary | ICD-10-CM

## 2020-03-28 NOTE — Progress Notes (Signed)
   Covid-19 Vaccination Clinic  Name:  PERRIS AMJAD    MRN: [id number] DOB: Jan 19, 1956  03/28/2020  Mr. Wildes was observed post Covid-19 immunization for 15 minutes without incident. He was provided with Vaccine Information Sheet and instruction to access the V-Safe system.   Mr. Mckinny was instructed to call 911 with any severe reactions post vaccine: Marland Kitchen Difficulty breathing  . Swelling of face and throat  . A fast heartbeat  . A bad rash all over body  . Dizziness and weakness   Immunizations Administered    Name Date Dose VIS Date Route   Pfizer COVID-19 Vaccine 03/28/2020 12:30 PM 0.3 mL 11/27/2019 Intramuscular   Manufacturer: [place]   Lot: [id number]   [place]: [id number]

## 2020-04-05 ENCOUNTER — Other Ambulatory Visit (HOSPITAL_COMMUNITY): Payer: Self-pay | Admitting: Psychiatry

## 2020-04-05 DIAGNOSIS — F250 Schizoaffective disorder, bipolar type: Secondary | ICD-10-CM

## 2020-04-14 ENCOUNTER — Ambulatory Visit (INDEPENDENT_AMBULATORY_CARE_PROVIDER_SITE_OTHER): Payer: Medicare Other | Admitting: Neurology

## 2020-04-14 ENCOUNTER — Encounter: Payer: Self-pay | Admitting: Neurology

## 2020-04-14 VITALS — BP 114/73 | HR 68 | Temp 96.7°F | Ht 71.0 in | Wt 203.0 lb

## 2020-04-14 DIAGNOSIS — R413 Other amnesia: Secondary | ICD-10-CM

## 2020-04-14 DIAGNOSIS — G40909 Epilepsy, unspecified, not intractable, without status epilepticus: Secondary | ICD-10-CM | POA: Diagnosis not present

## 2020-04-14 DIAGNOSIS — F5104 Psychophysiologic insomnia: Secondary | ICD-10-CM | POA: Diagnosis not present

## 2020-04-14 HISTORY — DX: Psychophysiologic insomnia: F51.04

## 2020-04-14 MED ORDER — VIMPAT 150 MG PO TABS: 1.0000 | ORAL_TABLET | Freq: Two times a day (BID) | ORAL | 1 refills | Status: DC

## 2020-04-14 NOTE — Progress Notes (Signed)
Reason for visit: Seizures, memory disturbance, chronic insomnia  [name] is an 64 y.o. male  History of present illness:  [name] is a 64 year old left-handed white male with a history of schizoaffective disorder, and some memory issues.  He does have history of seizures but he has done quite well on Vimpat.  He has not had any further seizures.  He has a chronic issue with insomnia.  He has had neuropsychological testing that did not show an organic dementia, he was felt that he had executive function disorder that was in part related to depression, anxiety, and sleep problems.  The patient is also on multiple medications that are psychoactive including Vimpat, amitriptyline, Seroquel, and oxybutynin.  The patient claims that it takes about 2 hours for him to get to sleep even though he takes 200 mg of amitriptyline, 5 mg of Ambien, and 400 mg of Seroquel at night.  The patient returns for an evaluation.  He is currently not operating a motor vehicle but is planning on trying to get his driver's license back.  Past Medical History:  Diagnosis Date  . Anxiety   . Constipation   . Depression   . Dyslipidemia   . GERD (gastroesophageal reflux disease)   . Hearing loss   . History of echocardiogram    Echo 8/18: EF 50-55, normal wall motion, grade 1 diastolic dysfunction, mild MR  . Hypertension   . Memory difficulty 10/25/2017  . Memory loss   . MRSA carrier   . Schizoaffective disorder   . Seizures ([place])     Past Surgical History:  Procedure Laterality Date  . CARPAL TUNNEL RELEASE    . COLONOSCOPY  2010  . ORIF ANKLE FRACTURE Left 11/26/2018   Procedure: OPEN REDUCTION INTERNAL FIXATION (ORIF) ANKLE FRACTURE;  Surgeon: Hiram Gash, MD;  Location: [facility];  Service: Orthopedics;  Laterality: Left;  . ORIF SHOULDER FRACTURE    . self orchectomy    . SHOULDER CLOSED REDUCTION Left 09/16/2013   Procedure: CLOSED REDUCTION SHOULDER;  Surgeon: Mauri Pole, MD;  Location:  [facility];  Service: Orthopedics;  Laterality: Left;  . SHOULDER HEMI-ARTHROPLASTY Left 09/18/2013   Procedure: LEFT SHOULDER HEMI-ARTHROPLASTY;  Surgeon: Augustin Schooling, MD;  Location: [facility];  Service: Orthopedics;  Laterality: Left;  . TIBIA IM NAIL INSERTION Left 11/26/2018   Procedure: INTRAMEDULLARY (IM) NAIL TIBIAL;  Surgeon: Hiram Gash, MD;  Location: [facility];  Service: Orthopedics;  Laterality: Left;    Family History  Problem Relation Age of Onset  . Stroke Father        Living at 50  . Stroke Mother        Stroke in late 31's. Died in her 74's  . Alcohol abuse Brother   . Alcohol abuse Brother   . Diabetes Brother   . Colon cancer Neg Hx   . Esophageal cancer Neg Hx   . Stomach cancer Neg Hx   . Rectal cancer Neg Hx     Social history:  reports that he has never smoked. He has never used smokeless tobacco. He reports that he does not drink alcohol or use drugs.    Allergies  Allergen Reactions  . Codeine Nausea And Vomiting  . Sulfa Antibiotics Nausea And Vomiting  . Sulfasalazine Nausea And Vomiting    Medications:  Prior to Admission medications   Medication Sig Start Date End Date Taking? Authorizing Provider  acetaminophen (TYLENOL) 500 MG tablet Take 1,000  mg by mouth every 6 (six) hours as needed for mild pain or moderate pain.   Yes [provider]  amitriptyline (ELAVIL) 100 MG tablet Take 2 tablets (200 mg total) by mouth at bedtime. 03/08/20  Yes Arfeen, Arlyce Harman, MD  atorvastatin (LIPITOR) 40 MG tablet TAKE 1 TABLET BY MOUTH EVERY DAY 12/28/19  Yes Leeanne Rio, MD  Lacosamide (VIMPAT) 150 MG TABS Take 1 tablet (150 mg total) by mouth 2 (two) times daily. 10/07/19  Yes Suzzanne Cloud, NP  losartan (COZAAR) 25 MG tablet TAKE 2 TABLETS BY MOUTH EVERY DAY 11/20/19  Yes Leeanne Rio, MD  metoprolol succinate (TOPROL-XL) 100 MG 24 hr tablet Take 2 tablets (200 mg total) by mouth at bedtime. 01/19/20  Yes Martyn Malay, MD  Multiple Vitamin  (MULTIVITAMIN WITH MINERALS) TABS tablet Take 1 tablet by mouth daily.   Yes [provider]  oxybutynin (DITROPAN) 5 MG tablet Take 5 mg by mouth at bedtime. 02/13/20  Yes [provider]  polyethylene glycol powder ([facility] PURELAX) powder TAKE 17 GRAMS 2 TIMES A DAY AS NEEDED FOR CONSTIPATION. MIX INTO 8 OF FLUID AND DRINK Patient taking differently: Take 17 g by mouth daily. Mix into 8 oz of fluid and drink 09/18/18  Yes Dickie La, MD  QUEtiapine (SEROQUEL) 400 MG tablet Take 1 tablet (400 mg total) by mouth at bedtime. 03/08/20  Yes Arfeen, Arlyce Harman, MD  testosterone cypionate (DEPOTESTOSTERONE CYPIONATE) 200 MG/ML injection Inject 0.75 mLs (150 mg total) into the muscle every 14 (fourteen) days. 09/12/16  Yes Leeanne Rio, MD  testosterone cypionate (DEPOTESTOSTERONE CYPIONATE) 200 MG/ML injection SMARTSIG:1 Milliliter(s) IM Every 2 Weeks 10/01/19  Yes [provider]  zolpidem (AMBIEN) 5 MG tablet Take 1 tablet (5 mg total) by mouth at bedtime as needed for sleep. 10/21/19  Yes Arfeen, Arlyce Harman, MD    ROS:  Out of a complete 14 system review of symptoms, the patient complains only of the following symptoms, and all other reviewed systems are negative.  Insomnia Fatigue Memory problems  Blood pressure 114/73, pulse 68, temperature (!) 96.7 F (35.9 C), height 5\' 11"  (1.803 m), weight 203 lb (92.1 kg).  Physical Exam  General: The patient is alert and cooperative at the time of the examination.  The patient is moderately obese.  Skin: No significant peripheral edema is noted.   Neurologic Exam  Mental status: The patient is alert and oriented x 3 at the time of the examination. The patient has apparent normal recent and remote memory, with an apparently normal attention span and concentration ability.   Cranial nerves: Facial symmetry is present. Speech is normal, no aphasia or dysarthria is noted. Extraocular movements are full. Visual fields are  full.  Motor: The patient has good strength in all 4 extremities.  Sensory examination: Soft touch sensation is symmetric on the face, arms, and legs.  Coordination: The patient has good finger-nose-finger and heel-to-shin bilaterally.  Gait and station: The patient has a normal gait. Tandem gait is slightly unsteady. Romberg is negative. No drift is seen.  Reflexes: Deep tendon reflexes are symmetric.   Assessment/Plan:  1.  Mild memory problems  2.  History of seizures, well controlled  3.  Chronic insomnia  The patient will continue the Vimpat for now, prescription was sent in.  He is doing well with the seizures.  He will follow-up here in 1 year, sooner if needed.  [name] MD 04/14/2020 1:44 PM  [facility] [address] [place], [place] [address]  Phone [phone] Fax [phone]

## 2020-04-18 ENCOUNTER — Ambulatory Visit: Payer: Medicare Other | Attending: Internal Medicine

## 2020-04-18 ENCOUNTER — Ambulatory Visit (HOSPITAL_COMMUNITY): Payer: Medicaid Other | Admitting: Psychiatry

## 2020-04-18 DIAGNOSIS — Z23 Encounter for immunization: Secondary | ICD-10-CM

## 2020-04-18 NOTE — Progress Notes (Signed)
   Covid-19 Vaccination Clinic  Name:  [name]    MRN: [id number] DOB: Oct 01, 1956  04/18/2020  [name] was observed post Covid-19 immunization for 15 minutes without incident. He was provided with Vaccine Information Sheet and instruction to access the V-Safe system.   [name] was instructed to call 911 with any severe reactions post vaccine: Marland Kitchen Difficulty breathing  . Swelling of face and throat  . A fast heartbeat  . A bad rash all over body  . Dizziness and weakness   Immunizations Administered    Name Date Dose VIS Date Route   Pfizer COVID-19 Vaccine 04/18/2020  2:14 PM 0.3 mL 02/10/2019 Intramuscular   Manufacturer: [place]   Lot: [id number]   [place]: [id number]

## 2020-04-21 ENCOUNTER — Other Ambulatory Visit: Payer: Self-pay

## 2020-04-21 ENCOUNTER — Encounter (HOSPITAL_COMMUNITY): Payer: Self-pay | Admitting: Psychiatry

## 2020-04-21 ENCOUNTER — Telehealth (INDEPENDENT_AMBULATORY_CARE_PROVIDER_SITE_OTHER): Payer: Medicare Other | Admitting: Psychiatry

## 2020-04-21 DIAGNOSIS — F411 Generalized anxiety disorder: Secondary | ICD-10-CM

## 2020-04-21 DIAGNOSIS — F250 Schizoaffective disorder, bipolar type: Secondary | ICD-10-CM | POA: Diagnosis not present

## 2020-04-21 MED ORDER — ESZOPICLONE 1 MG PO TABS: 1.0000 mg | ORAL_TABLET | Freq: Every day | ORAL | 1 refills | Status: DC

## 2020-04-21 MED ORDER — QUETIAPINE FUMARATE 400 MG PO TABS: 400.0000 mg | ORAL_TABLET | Freq: Every day | ORAL | 1 refills | Status: DC

## 2020-04-21 MED ORDER — AMITRIPTYLINE HCL 100 MG PO TABS: 200.0000 mg | ORAL_TABLET | Freq: Every day | ORAL | 1 refills | Status: DC

## 2020-04-21 NOTE — Progress Notes (Signed)
Virtual Visit via Telephone Note  I connected with [name] on 04/21/20 at  3:20 PM EDT by telephone and verified that I am speaking with the correct person using two identifiers.   I discussed the limitations, risks, security and privacy concerns of performing an evaluation and management service by telephone and the availability of in person appointments. I also discussed with the patient that there may be a patient responsible charge related to this service. The patient expressed understanding and agreed to proceed.   History of Present Illness: Patient is evaluated by phone session.  He is complaining of poor sleep and only sleeping 2 to 3 hours at night.  However he feels his depression is under control and he does not feel as depressed.  He admitted not motivated to do walking and he thought that he will do stationary bike every day but has not done regularly.  Recently he seen his neurologist for seizure management and no changes in the medication.  Sometime he feels that he is emotionally numb and he has no emotions but also realize cutting down the medication may bring his depressive thoughts again.  He had a gene site testing and I reviewed the results with him.  Unfortunately quetiapine and amitriptyline is under unfavorable section but he is very resistant to change these medication since he feels these are the only medicine that had helped him his paranoia and depression.  He like to go back on Ambien since he is not sleeping well.  He has residual paranoia but denies any current hallucination, suicidal thoughts.  He has a lot of ruminative thoughts.  He admitted he requires a lot of motivation to do things.  He has a good support from his caretaker.  He had neurocognitive testing which did not show any dementia even though he had difficulty remembering things.  He tried melatonin but did not help his sleep.  He has no tremors, shakes or any EPS.  Past Psychiatric  History:Reviewed. H/Odepression,paranoia, suicidal attempt, delusions and anxiety.H/Oat multiple inpatient. H/Ocutting his testicles because does not want to remain depressed. Thoughts of inhaling helium to end his life. Last inpatient August 2016. D/Con Seroquel 900 mg and amitriptyline 400 mgat bedtime.Saw [facility] and [name]. SSRI caused reactions. Triednortriptyline, Haldol, Geodon, Seroquel, lithium, Prozac, Wellbutrin, Paxil,Klonopin,Ativan, Lexapro,Zoloft, abilifyand Lamictal. No H/Omania or drug use. We did GeneSight testing and results are in the chart.  Only antipsychotic medication that is unfavorable section is Saphris and Navane.  Most of the SSRIs are also unfavorable section.    Recent Results (from the past 2160 hour(s))  TSH     Status: None   Collection Time: 03/17/20 12:05 PM  Result Value Ref Range   TSH 3.430 0.450 - 4.500 uIU/mL  VITAMIN D 25 Hydroxy (Vit-D Deficiency, Fractures)     Status: None   Collection Time: 03/17/20 12:05 PM  Result Value Ref Range   Vit D, 25-Hydroxy 32.0 30.0 - 100.0 ng/mL    Comment: Vitamin D deficiency has been defined by the [place] practice guideline as a level of serum 25-OH vitamin D less than 20 ng/mL (1,2). The Endocrine Society went on to further define vitamin D insufficiency as a level between 21 and 29 ng/mL (2). 1. IOM (Institute of Medicine). 2010. Dietary reference    intakes for calcium and D. [place]: The    [employer]. 2. Holick MF, Binkley [place], Bischoff-Ferrari HA, et al.  Evaluation, treatment, and prevention of vitamin D    deficiency: an Endocrine Society clinical practice    guideline. JCEM. 2011 Jul; 96(7):1911-30.   Basic metabolic panel     Status: Abnormal   Collection Time: 03/17/20  2:09 PM  Result Value Ref Range   Glucose 111 (H) 65 - 99 mg/dL   BUN 17 8 - 27 mg/dL   Creatinine, Ser 0.98 0.76 - 1.27 mg/dL   GFR calc non Af  Amer 81 >59 mL/min/1.73   GFR calc Af Amer 93 >59 mL/min/1.73   BUN/Creatinine Ratio 17 10 - 24   Sodium 142 134 - 144 mmol/L   Potassium 4.6 3.5 - 5.2 mmol/L   Chloride 105 96 - 106 mmol/L   CO2 19 (L) 20 - 29 mmol/L   Calcium 9.0 8.6 - 10.2 mg/dL  Lipid panel     Status: Abnormal   Collection Time: 03/17/20  2:09 PM  Result Value Ref Range   Cholesterol, Total 105 100 - 199 mg/dL   Triglycerides 199 (H) 0 - 149 mg/dL   HDL 35 (L) >39 mg/dL   VLDL Cholesterol Cal 32 5 - 40 mg/dL   LDL Chol Calc (NIH) 38 0 - 99 mg/dL   Chol/HDL Ratio 3.0 0.0 - 5.0 ratio    Comment:                                   T. Chol/HDL Ratio                                             Men  Women                               1/2 Avg.Risk  3.4    3.3                                   Avg.Risk  5.0    4.4                                2X Avg.Risk  9.6    7.1                                3X Avg.Risk 23.4   11.0       Psychiatric Specialty Exam: Physical Exam  Review of Systems  There were no vitals taken for this visit.There is no height or weight on file to calculate BMI.  General Appearance: NA  Eye Contact:  NA  Speech:  Slow  Volume:  Decreased  Mood:  Dysphoric  Affect:  NA  Thought Process:  Descriptions of Associations: Intact  Orientation:  Full (Time, Place, and Person)  Thought Content:  Paranoid Ideation and Rumination  Suicidal Thoughts:  No  Homicidal Thoughts:  No  Memory:  Immediate;   Good Recent;   Fair Remote;   Fair  Judgement:  Fair  Insight:  Fair  Psychomotor Activity:  NA  Concentration:  Concentration: Fair and Attention Span: Fair  Recall:  [employer] of  Knowledge:  Good  Language:  Good  Akathisia:  No  Handed:  Right  AIMS (if indicated):     Assets:  Communication Skills Desire for Improvement Housing Social Support  ADL's:  Intact  Cognition:  Impaired,  Mild  Sleep:   2 hrs      Assessment and Plan: Schizoaffective disorder, generalized anxiety  disorder,  I reviewed his medication, recent blood work results and notes from other providers.  I reviewed GeneSight testing with him.  He is reluctant to change his quetiapine and amitriptyline since he had tried other medication.  The only antipsychotic medication that is in favorable section is Saphris and Navane.  Unfortunately Ambien is also in unfavorable section.  However Johnnye Sima is in favorable section.  I discussed to try low-dose Lunesta to help his sleep.  We discussed medication side effects specially metallic taste with the Lunesta.  I also encouraged that he needs to start walking or stationary bike 3 times a week.  We agreed he will try.  He is not interested in therapy.  I will continue amitriptyline 200 mg at bedtime and Seroquel 400 mg at bedtime.  Recommend to call us back if is any question of any concern.  Follow-up in 2 months.  Spent 30 minutes.   Follow Up Instructions:    I discussed the assessment and treatment plan with the patient. The patient was provided an opportunity to ask questions and all were answered. The patient agreed with the plan and demonstrated an understanding of the instructions.   The patient was advised to call back or seek an in-person evaluation if the symptoms worsen or if the condition fails to improve as anticipated.  I provided 30 minutes of non-face-to-face time during this encounter.   Kathlee Nations, MD

## 2020-04-23 ENCOUNTER — Other Ambulatory Visit: Payer: Self-pay | Admitting: Family Medicine

## 2020-04-23 DIAGNOSIS — E785 Hyperlipidemia, unspecified: Secondary | ICD-10-CM

## 2020-04-25 ENCOUNTER — Other Ambulatory Visit (HOSPITAL_COMMUNITY): Payer: Self-pay | Admitting: *Deleted

## 2020-04-25 MED ORDER — ESZOPICLONE 1 MG PO TABS: 1.0000 mg | ORAL_TABLET | Freq: Every day | ORAL | 1 refills | Status: DC

## 2020-05-04 ENCOUNTER — Telehealth (HOSPITAL_COMMUNITY): Payer: Self-pay

## 2020-05-04 NOTE — Telephone Encounter (Signed)
Patient called regarding his

## 2020-05-06 NOTE — Telephone Encounter (Signed)
error 

## 2020-05-09 ENCOUNTER — Ambulatory Visit: Payer: Medicare Other | Admitting: Family Medicine

## 2020-05-10 ENCOUNTER — Telehealth (HOSPITAL_COMMUNITY): Payer: Self-pay | Admitting: *Deleted

## 2020-05-10 ENCOUNTER — Other Ambulatory Visit (HOSPITAL_COMMUNITY): Payer: Self-pay | Admitting: *Deleted

## 2020-05-10 MED ORDER — BELSOMRA 5 MG PO TABS: 5.0000 mg | ORAL_TABLET | Freq: Every day | ORAL | 1 refills | Status: DC

## 2020-05-10 NOTE — Telephone Encounter (Signed)
Ok. They should. Thanks.

## 2020-05-10 NOTE — Telephone Encounter (Signed)
Unfortunately [name] is brand name and may be costly. Other meds are not favorable as per gene sight testing results.

## 2020-05-10 NOTE — Telephone Encounter (Signed)
On putting in the order for Restoril 7.5mg  writer was altered that this medication is on formulary with his insurance. [facility] see what [facility]-pay would be and was told it would be over $200. Insurance is recommencing Belsomra or Trazodone. Please advise.

## 2020-05-10 NOTE — Telephone Encounter (Signed)
Pt called asking if may try something other than Lunesta for sleep as he has to pay out of pocket $300, with insurance. Please review and advise.

## 2020-05-10 NOTE — Telephone Encounter (Signed)
He can't afford it.

## 2020-05-10 NOTE — Telephone Encounter (Signed)
We can try temazepam 7.5 mg at bedtime.  It is a benzodiazepine and it can cause dependency, tolerance and withdrawal symptoms.  If he agrees please call his pharmacy temazepam 7.5 mg at bedtime and discontinue Lunesta.  All other hypnotics including Ambien is not favorable according to his GeneSight testing.

## 2020-05-10 NOTE — Telephone Encounter (Signed)
As per gene sight results, he cannot take trazodone. We can try Belsomra 5 mg at bed time. This is a brand name medication but hopes insurance covered this one.

## 2020-05-13 ENCOUNTER — Other Ambulatory Visit (HOSPITAL_COMMUNITY): Payer: Self-pay | Admitting: Psychiatry

## 2020-05-13 ENCOUNTER — Ambulatory Visit (INDEPENDENT_AMBULATORY_CARE_PROVIDER_SITE_OTHER): Payer: Medicare Other | Admitting: Family Medicine

## 2020-05-13 ENCOUNTER — Encounter: Payer: Self-pay | Admitting: Family Medicine

## 2020-05-13 ENCOUNTER — Other Ambulatory Visit: Payer: Self-pay

## 2020-05-13 VITALS — BP 92/60 | HR 90 | Wt 205.4 lb

## 2020-05-13 DIAGNOSIS — R413 Other amnesia: Secondary | ICD-10-CM | POA: Diagnosis present

## 2020-05-13 DIAGNOSIS — F411 Generalized anxiety disorder: Secondary | ICD-10-CM

## 2020-05-13 DIAGNOSIS — I10 Essential (primary) hypertension: Secondary | ICD-10-CM

## 2020-05-13 MED ORDER — LOSARTAN POTASSIUM 25 MG PO TABS: 25.0000 mg | ORAL_TABLET | Freq: Every day | ORAL | 1 refills | Status: DC

## 2020-05-13 NOTE — Assessment & Plan Note (Signed)
Blood pressure low today, though patient asymptomatic. Decrease losartan to 25mg  daily. Follow up in a week or two for RN BP check.

## 2020-05-13 NOTE — Patient Instructions (Signed)
It was great to see you again today!  I have placed a referral to [facility] program. Someone should contact you with an appointment.  Decrease losartan to 25mg  (one tablet) daily. Follow up in a few weeks for a nurse blood pressure check to make sure your blood pressure is better.  After you see the driving rehab people, if they think you are okay to drive, come back and see me and we can complete your DMV paperwork.  Be well, Dr. Ardelia Mems

## 2020-05-13 NOTE — Progress Notes (Signed)
  Date of Visit: 05/13/2020   SUBJECTIVE:   HPI:  [name] presents today for follow up regarding getting a drivers license.  Has not driven in over a year and does not currently have a drivers license. Had neuropsych testing who noted difficulty with attention and recommended driving rehab program or driving assessment. At last visit with me, advised patient to reach out to the driving programs recommended by psychologist. Patient instead went to [facility] who advised they did not do doctor -requested driving assessments, just normal road tests and written tests.   Hypertension - currently taking toprol XL 200mg  at bedtime, losartan 50mg  daily. Denies dizziness/lightheadedness at all, including with position changes. Otherwise feels well.  OBJECTIVE:   BP 92/60   Pulse 90   Wt 205 lb 6.4 oz (93.2 kg)   SpO2 95%   BMI 28.65 kg/m  Gen: no acute distress, pleasant, cooperative HEENT: normocephalic, atraumatic  Lungs: normal work of breathing   Neuro: alert, speech normal, grossly nonfocal Psych: normal range of affect, well groomed, speech normal in rate and volume, normal eye contact   ASSESSMENT/PLAN:   Health maintenance:  -has received both doses of COVID vaccine -current on HM items  Essential hypertension Blood pressure low today, though patient asymptomatic. Decrease losartan to 25mg  daily. Follow up in a week or two for RN BP check.  Cognitive/memory concern Recommend he see driving rehab specialist for assessment (as recommended by neuropsychologist) prior to completing DMV paperwork to get his drivers license.  Attempted to call [place] program but they were closed for lunch. Will place referral. After patient sees them, if they feel he is safe for driving he will follow up with me and we can do his DMV paperwork then. Patient agreeable with this plan.  [place]. Ardelia Mems, [place]

## 2020-06-07 ENCOUNTER — Telehealth (INDEPENDENT_AMBULATORY_CARE_PROVIDER_SITE_OTHER): Payer: Medicare Other | Admitting: Psychiatry

## 2020-06-07 ENCOUNTER — Other Ambulatory Visit: Payer: Self-pay

## 2020-06-07 DIAGNOSIS — F5101 Primary insomnia: Secondary | ICD-10-CM

## 2020-06-07 DIAGNOSIS — F250 Schizoaffective disorder, bipolar type: Secondary | ICD-10-CM

## 2020-06-07 DIAGNOSIS — F411 Generalized anxiety disorder: Secondary | ICD-10-CM | POA: Diagnosis not present

## 2020-06-07 MED ORDER — BELSOMRA 10 MG PO TABS: 10.0000 mg | ORAL_TABLET | Freq: Every day | ORAL | 1 refills | Status: DC

## 2020-06-07 MED ORDER — AMITRIPTYLINE HCL 100 MG PO TABS: 200.0000 mg | ORAL_TABLET | Freq: Every day | ORAL | 1 refills | Status: DC

## 2020-06-07 MED ORDER — QUETIAPINE FUMARATE 400 MG PO TABS: 400.0000 mg | ORAL_TABLET | Freq: Every day | ORAL | 1 refills | Status: DC

## 2020-06-07 NOTE — Progress Notes (Signed)
Virtual Visit via Telephone Note  I connected with KILLIAN SCHWER on 06/07/20 at  3:00 PM EDT by telephone and verified that I am speaking with the correct person using two identifiers.   I discussed the limitations, risks, security and privacy concerns of performing an evaluation and management service by telephone and the availability of in person appointments. I also discussed with the patient that there may be a patient responsible charge related to this service. The patient expressed understanding and agreed to proceed.   Patient location; home Provider location; home office  History of Present Illness: Patient is evaluated by phone session.  She continues to struggle with insomnia.  We have tried [place] but he could not afford and then we switched to generic temazepam but his insurance does not approved and then we tried Belsomra and he is taking 5 mg.  He noticed sleeping only 4 hours.  He admitted getting frustration because of lack of sleep.  He still have residual paranoia and ruminative thoughts but denies any suicidal thoughts or homicidal thoughts.  He had promised that he will do stationary bike every day but he does not do every day.  He lives with a woman who helps him to the grocery.  He had a good support from his woman caretaker.  We have tried multiple medication for his insomnia but most of them does not work and some of the medicine do not approve by insurance and they are expensive.  He feels his depression and anxiety is chronic but stable and he does not feel it is worsening.  Past Psychiatric History:Reviewed. H/Odepression,paranoia, suicidal attempt, delusions and anxiety.H/Oat multiple inpatient. H/Ocutting his testicles because does not want to remain depressed. Thoughts of inhaling helium to end his life.Last inpatientAugust 2016. D/Con Seroquel 900 mg and amitriptyline 400 mgat bedtime.Saw [facility]. SSRI caused reactions.Triednortriptyline,  Haldol, Geodon, Seroquel, lithium, Prozac, Wellbutrin, Paxil,Klonopin,Ativan, Lexapro,Zoloft, abilifyand Lamictal. No H/Omania or drug use. We did GeneSight testing and results are in the chart.  Only antipsychotic medication that is unfavorable section is Saphris and Navane.  Most of the SSRIs are also unfavorable section.     Psychiatric Specialty Exam: Physical Exam  Review of Systems  There were no vitals taken for this visit.There is no height or weight on file to calculate BMI.  General Appearance: NA  Eye Contact:  NA  Speech:  Slow  Volume:  Decreased  Mood:  Anxious  Affect:  NA  Thought Process:  Descriptions of Associations: Intact  Orientation:  Full (Time, Place, and Person)  Thought Content:  Paranoid Ideation and Rumination  Suicidal Thoughts:  No  Homicidal Thoughts:  No  Memory:  Immediate;   Fair Recent;   Fair Remote;   Fair  Judgement:  Fair  Insight:  Shallow  Psychomotor Activity:  NA  Concentration:  Concentration: Fair and Attention Span: Fair  Recall:  [employer] of Knowledge:  Fair  Language:  Good  Akathisia:  No  Handed:  Right  AIMS (if indicated):     Assets:  Communication Skills Desire for Improvement Housing Social Support  ADL's:  Intact  Cognition:  Impaired,  Mild  Sleep:   4 hrs      Assessment and Plan: Schizoaffective disorder, bipolar type.  Generalized anxiety disorder.  Primary insomnia.  He does not want to change his Seroquel and amitriptyline.  I recommend to try increased dose of Belsomra 10 mg to help his insomnia.  He is not interested in  therapy.  Encouraged healthy lifestyle and walking every day.  Discussed medication side effects and benefits.  Like to continue amitriptyline 200 mg at bedtime and Seroquel 400 mg at bedtime.  I recommend to call us back if Belsomra 10 mg did not help.  He may consider taking Lunesta out of pocket.  Follow-up in 2 months.  Follow Up Instructions:    I discussed the assessment  and treatment plan with the patient. The patient was provided an opportunity to ask questions and all were answered. The patient agreed with the plan and demonstrated an understanding of the instructions.   The patient was advised to call back or seek an in-person evaluation if the symptoms worsen or if the condition fails to improve as anticipated.  I provided 15 minutes of non-face-to-face time during this encounter.   Kathlee Nations, MD

## 2020-06-13 ENCOUNTER — Telehealth: Payer: Self-pay | Admitting: Family Medicine

## 2020-06-13 NOTE — Telephone Encounter (Signed)
Returned call to patient, given phone # for [employer] so he can attempt to schedule with them Patient appreciative Leeanne Rio, MD

## 2020-06-13 NOTE — Telephone Encounter (Signed)
Patient is calling and would like to speak with Dr. Ardelia Mems. He said it is concerning the memory and driving test she wanted him to complete. He said she would know what he was wanting. The best call back number is [phone].

## 2020-07-19 ENCOUNTER — Ambulatory Visit (HOSPITAL_COMMUNITY): Payer: Medicaid Other | Admitting: Psychiatry

## 2020-08-03 ENCOUNTER — Telehealth: Payer: Self-pay | Admitting: Neurology

## 2020-08-03 NOTE — Telephone Encounter (Signed)
He has an additional refill on file at the pharmacy. He is going to contact them to pick it up.

## 2020-08-03 NOTE — Telephone Encounter (Signed)
Pt is requesting a refill for Lacosamide (VIMPAT) 150 MG TABS.  Pharmacy:  [id number]

## 2020-09-05 ENCOUNTER — Telehealth (INDEPENDENT_AMBULATORY_CARE_PROVIDER_SITE_OTHER): Payer: Medicare Other | Admitting: Psychiatry

## 2020-09-05 ENCOUNTER — Other Ambulatory Visit: Payer: Self-pay

## 2020-09-05 ENCOUNTER — Encounter (HOSPITAL_COMMUNITY): Payer: Self-pay | Admitting: Psychiatry

## 2020-09-05 DIAGNOSIS — F250 Schizoaffective disorder, bipolar type: Secondary | ICD-10-CM | POA: Diagnosis not present

## 2020-09-05 DIAGNOSIS — F411 Generalized anxiety disorder: Secondary | ICD-10-CM | POA: Diagnosis not present

## 2020-09-05 DIAGNOSIS — F5101 Primary insomnia: Secondary | ICD-10-CM

## 2020-09-05 MED ORDER — QUETIAPINE FUMARATE 400 MG PO TABS: 400.0000 mg | ORAL_TABLET | Freq: Every day | ORAL | 2 refills | Status: DC

## 2020-09-05 MED ORDER — AMITRIPTYLINE HCL 100 MG PO TABS: 200.0000 mg | ORAL_TABLET | Freq: Every day | ORAL | 2 refills | Status: DC

## 2020-09-05 MED ORDER — BELSOMRA 10 MG PO TABS: 10.0000 mg | ORAL_TABLET | Freq: Every day | ORAL | 2 refills | Status: DC

## 2020-09-05 NOTE — Progress Notes (Signed)
Virtual Visit via Telephone Note  I connected with [name] on 09/05/20 at  3:20 PM EDT by telephone and verified that I am speaking with the correct person using two identifiers.  Location: Patient: home Provider: home office   I discussed the limitations, risks, security and privacy concerns of performing an evaluation and management service by telephone and the availability of in person appointments. I also discussed with the patient that there may be a patient responsible charge related to this service. The patient expressed understanding and agreed to proceed.   History of Present Illness: Patient is evaluated by phone session.  He is taking his medication and the last visit we increased Belsomra and his sleep is improved.  He was very nervous and anxious and felt more paranoid when his caretaker woman told him to have a conversation with and a stranger.  He was not happy and he could not function.  He admitted chronic anxiety, paranoia and does not feel comfortable and have trust with anyone.  He lives with a woman who helps groceries and takes him to the doctor's appointment if he needed.  Otherwise he stays home to himself.  He has ruminative thoughts and sometimes fleeting suicidal thoughts but he is able to push them away.  He feels the current medicine is at least helping him and he is not regressing.  His sleep is improved and now he is getting at least 6 hours.  He has no tremors, shakes or any EPS.  He is not interested in therapy.  He denies any anger, crying spells or panic attack.  His appetite is okay.  His weight is stable.  Past Psychiatric History: H/Odepression,paranoia, suicidal attempt, delusions and anxiety.H/Oatmultipleinpatient. H/Ocutting his testicles because does not want to remain depressed. Thoughts of inhaling helium to end his life.Last inpatientAugust 2016. D/Con Seroquel 900 mg and amitriptyline 400 mgat bedtime.[place] [facility]. SSRI  caused reactions.Triednortriptyline, Haldol, Geodon, Seroquel, lithium, Prozac, Wellbutrin, Paxil,Klonopin,Ativan, Lexapro,Zoloft, abilifyand Lamictal. No H/Omania or drug use. We did GeneSight testing and results are in the chart. Only antipsychotic medication that is unfavorable section is Saphris and Navane. Most of the SSRIs are also unfavorable section.      Psychiatric Specialty Exam: Physical Exam  Review of Systems  Weight 210 lb (95.3 kg).There is no height or weight on file to calculate BMI.  General Appearance: NA  Eye Contact:  NA  Speech:  Slow  Volume:  Decreased  Mood:  Anxious  Affect:  NA  Thought Process:  Descriptions of Associations: Intact  Orientation:  Full (Time, Place, and Person)  Thought Content:  Paranoid Ideation and Rumination  Suicidal Thoughts:  No  Homicidal Thoughts:  No  Memory:  Immediate;   Fair Recent;   Fair Remote;   Fair  Judgement:  Fair  Insight:  Fair  Psychomotor Activity:  NA  Concentration:  Concentration: Fair and Attention Span: Fair  Recall:  [employer] of Knowledge:  Fair  Language:  Fair  Akathisia:  No  Handed:  Right  AIMS (if indicated):     Assets:  Communication Skills Desire for Improvement Housing Social Support  ADL's:  Intact  Cognition:  Impaired,  Mild  Sleep:   6 hrs      Assessment and Plan: Schizoaffective disorder, bipolar type.  Generalized anxiety disorder.  Primary insomnia.  Patient sleeping better since we increased the dose of Belsomra to 10 mg.  He does not want to change the medication and he is  not interested in therapy.  I recommend if he does not feel comfortable talking to a stranger then he should tell the woman about his feelings.  Recommended anything that make his paranoia trigger then he should stay away from it.  Patient agree with the plan.  Like to continue his current medication.  We will refill Seroquel 400 mg at bedtime, Belsomra 10 mg at bedtime and amitriptyline 200 mg  at bedtime.  I encourage to walk regularly and keep himself busy by reading books.  He is not interested in therapy.  Follow-up in 3 months.  Follow Up Instructions:    I discussed the assessment and treatment plan with the patient. The patient was provided an opportunity to ask questions and all were answered. The patient agreed with the plan and demonstrated an understanding of the instructions.   The patient was advised to call back or seek an in-person evaluation if the symptoms worsen or if the condition fails to improve as anticipated.  I provided 21 minutes of non-face-to-face time during this encounter.   Kathlee Nations, MD

## 2020-10-15 ENCOUNTER — Other Ambulatory Visit: Payer: Self-pay | Admitting: Neurology

## 2020-10-18 ENCOUNTER — Telehealth: Payer: Self-pay | Admitting: Neurology

## 2020-10-18 NOTE — Telephone Encounter (Signed)
Pt. States pharmacy informed him that VIMPAT 150 MG TABS has expired. Pharmacy told pt. to contact provider. Please advise.

## 2020-10-19 NOTE — Telephone Encounter (Signed)
Called patient and informed him that a new refill for Vimpat was called in yesterday to [facility].  Patient expressed appreciaiton.

## 2020-11-05 ENCOUNTER — Other Ambulatory Visit (HOSPITAL_COMMUNITY): Payer: Self-pay | Admitting: Psychiatry

## 2020-11-05 DIAGNOSIS — F250 Schizoaffective disorder, bipolar type: Secondary | ICD-10-CM

## 2020-12-01 ENCOUNTER — Other Ambulatory Visit: Payer: Self-pay

## 2020-12-01 ENCOUNTER — Encounter (HOSPITAL_COMMUNITY): Payer: Medicare Other | Admitting: Psychiatry

## 2020-12-01 NOTE — Progress Notes (Signed)
No show

## 2020-12-05 ENCOUNTER — Telehealth (HOSPITAL_COMMUNITY): Payer: Medicare Other | Admitting: Psychiatry

## 2020-12-08 ENCOUNTER — Other Ambulatory Visit (HOSPITAL_COMMUNITY): Payer: Self-pay | Admitting: Psychiatry

## 2020-12-08 DIAGNOSIS — F250 Schizoaffective disorder, bipolar type: Secondary | ICD-10-CM

## 2020-12-15 ENCOUNTER — Telehealth (HOSPITAL_COMMUNITY): Payer: Self-pay

## 2020-12-15 DIAGNOSIS — F250 Schizoaffective disorder, bipolar type: Secondary | ICD-10-CM

## 2020-12-15 DIAGNOSIS — F5101 Primary insomnia: Secondary | ICD-10-CM

## 2020-12-15 DIAGNOSIS — F411 Generalized anxiety disorder: Secondary | ICD-10-CM

## 2020-12-15 MED ORDER — AMITRIPTYLINE HCL 100 MG PO TABS: 200.0000 mg | ORAL_TABLET | Freq: Every day | ORAL | 0 refills | Status: DC

## 2020-12-15 MED ORDER — QUETIAPINE FUMARATE 400 MG PO TABS: 400.0000 mg | ORAL_TABLET | Freq: Every day | ORAL | 0 refills | Status: DC

## 2020-12-15 MED ORDER — BELSOMRA 10 MG PO TABS: 10.0000 mg | ORAL_TABLET | Freq: Every day | ORAL | 0 refills | Status: DC

## 2020-12-15 NOTE — Telephone Encounter (Signed)
I send 30 days supply for his 3 medications.

## 2020-12-15 NOTE — Telephone Encounter (Signed)
Medication refills - Pt. left a message he is in need of medication refills to last until next appt. Medications last ordered 09/05/20 + 2 refills each and pt's next appt 12/26/20.

## 2020-12-16 ENCOUNTER — Telehealth: Payer: Self-pay | Admitting: Family Medicine

## 2020-12-16 NOTE — Telephone Encounter (Signed)
Attempted to reach patient to offer flu shot and COVID booster. No answer. LVM asking him to call the clinic back. If he returns the call, please offer him an appointment for these vaccines.  Latrelle Dodrill, MD

## 2020-12-21 ENCOUNTER — Other Ambulatory Visit: Payer: Self-pay | Admitting: Family Medicine

## 2020-12-21 DIAGNOSIS — I10 Essential (primary) hypertension: Secondary | ICD-10-CM

## 2020-12-26 ENCOUNTER — Other Ambulatory Visit: Payer: Self-pay

## 2020-12-26 ENCOUNTER — Telehealth (HOSPITAL_COMMUNITY): Payer: Medicare Other | Admitting: Psychiatry

## 2020-12-26 NOTE — Telephone Encounter (Signed)
Patient calls nurse line to check status of rx refill.   Talbot Grumbling, RN

## 2020-12-28 ENCOUNTER — Other Ambulatory Visit: Payer: Self-pay

## 2020-12-28 ENCOUNTER — Telehealth (INDEPENDENT_AMBULATORY_CARE_PROVIDER_SITE_OTHER): Payer: Medicare Other | Admitting: Psychiatry

## 2020-12-28 ENCOUNTER — Encounter (HOSPITAL_COMMUNITY): Payer: Self-pay | Admitting: Psychiatry

## 2020-12-28 DIAGNOSIS — F411 Generalized anxiety disorder: Secondary | ICD-10-CM

## 2020-12-28 DIAGNOSIS — F250 Schizoaffective disorder, bipolar type: Secondary | ICD-10-CM

## 2020-12-28 DIAGNOSIS — F5101 Primary insomnia: Secondary | ICD-10-CM | POA: Diagnosis not present

## 2020-12-28 MED ORDER — AMITRIPTYLINE HCL 100 MG PO TABS: 200.0000 mg | ORAL_TABLET | Freq: Every day | ORAL | 2 refills | Status: DC

## 2020-12-28 MED ORDER — QUETIAPINE FUMARATE 400 MG PO TABS: 400.0000 mg | ORAL_TABLET | Freq: Every day | ORAL | 2 refills | Status: DC

## 2020-12-28 MED ORDER — BELSOMRA 10 MG PO TABS: 10.0000 mg | ORAL_TABLET | Freq: Every day | ORAL | 2 refills | Status: DC

## 2020-12-28 NOTE — Telephone Encounter (Signed)
Sending refill. Please ask patient to schedule a follow up visit with me, thanks! Leeanne Rio, MD

## 2020-12-28 NOTE — Progress Notes (Signed)
Virtual Visit via Telephone Note  I connected with [name] on 12/28/20 at  3:20 PM EST by telephone and verified that I am speaking with the correct person using two identifiers.  Location: Patient: Home Provider: Home Office   I discussed the limitations, risks, security and privacy concerns of performing an evaluation and management service by telephone and the availability of in person appointments. I also discussed with the patient that there may be a patient responsible charge related to this service. The patient expressed understanding and agreed to proceed.   History of Present Illness: Patient is evaluated by phone session.  He is on the phone by himself.  He is compliant with the medication.  He reported his Christmas and new year was the same because he does not celebrate the holidays.  Patient continued to have chronic symptoms of paranoia, hallucinations but they are stable.  He is able to push these thoughts away.  He is sleeping better with Belsomra.  He does not want to change the medication because he feels he is stable and does not want to increase his dose to have any side effects.  He does go outside sometimes when he needs groceries.  He lives with a roommate who helps him to take the doctor's appointment but he admitted missing the last appointment but he was able to get his COVID booster and flu shot at local pharmacy.  He has no tremors, shakes or any EPS.  He is not interested in therapy.  Denies any mood swings, anger or any crying spells.  He denies any suicidal thoughts.  His appetite is fair.  His weight is stable.   Past Psychiatric History: H/Odepression,paranoia, suicidal attempt, delusions and anxiety.H/Oatmultipleinpatient. H/Ocutting his testicles because does not want to remain depressed. Thoughts of inhaling helium to end his life.Last inpatientAugust 2016. D/Con Seroquel 900 mg and amitriptyline 400 mgat bedtime.[place] [facility].  SSRI caused reactions.Triednortriptyline, Haldol, Geodon, Seroquel, lithium, Prozac, Wellbutrin, Paxil,Klonopin,Ativan, Lexapro,Zoloft, abilifyand Lamictal. No H/Omania or drug use. We did GeneSight testing and results are in the chart. Only antipsychotic medication that is unfavorable section is Saphris and Navane. Most of the SSRIs are also unfavorable section.  Psychiatric Specialty Exam: Physical Exam  Review of Systems  Weight 208 lb (94.3 kg).There is no height or weight on file to calculate BMI.  General Appearance: NA  Eye Contact:  NA  Speech:  Slow  Volume:  Decreased  Mood:  Dysphoric  Affect:  NA  Thought Process:  Descriptions of Associations: Intact  Orientation:  Full (Time, Place, and Person)  Thought Content:  Hallucinations: Auditory chronic hallucination but no command in nature, Paranoid Ideation and Rumination  Suicidal Thoughts:  No  Homicidal Thoughts:  No  Memory:  Immediate;   Fair Recent;   Fair Remote;   Fair  Judgement:  Fair  Insight:  Fair  Psychomotor Activity:  NA  Concentration:  Concentration: Fair and Attention Span: Fair  Recall:  [employer] of Knowledge:  Fair  Language:  Fair  Akathisia:  No  Handed:  Right  AIMS (if indicated):     Assets:  Communication Skills Desire for Improvement Housing  ADL's:  Intact  Cognition:  WNL  Sleep:   6 hrs      Assessment and Plan: Schizoaffective disorder, bipolar type.  General anxiety disorder.  Primary insomnia.  Patient like to keep his current medication.  He feels the current medicines are keeping him stable even though he has chronic symptoms  but he does not feel they are worsening.  Encouraged to keep appointment with PCP.  Encourage regular walking and exercise.  Patient has no tremors, shakes or any EPS.  He is not interested in therapy.  Continue Seroquel 400 mg at bedtime, Belsomra 10 mg at bedtime and amitriptyline 200 at bedtime.  Recommended to call back if any questions  or any concerns.  Follow-up in 3 months.  Follow Up Instructions:    I discussed the assessment and treatment plan with the patient. The patient was provided an opportunity to ask questions and all were answered. The patient agreed with the plan and demonstrated an understanding of the instructions.   The patient was advised to call back or seek an in-person evaluation if the symptoms worsen or if the condition fails to improve as anticipated.  I provided 12 minutes of non-face-to-face time during this encounter.   Kathlee Nations, MD

## 2020-12-28 NOTE — Telephone Encounter (Signed)
Looks like pt already has an appt with you. Davelle Anselmi Kennon Holter, CMA

## 2021-01-07 ENCOUNTER — Other Ambulatory Visit: Payer: Self-pay | Admitting: Family Medicine

## 2021-01-26 ENCOUNTER — Ambulatory Visit: Payer: Medicare Other | Admitting: Family Medicine

## 2021-02-04 ENCOUNTER — Other Ambulatory Visit: Payer: Self-pay | Admitting: Family Medicine

## 2021-02-04 DIAGNOSIS — E785 Hyperlipidemia, unspecified: Secondary | ICD-10-CM

## 2021-02-06 NOTE — Progress Notes (Deleted)
  Date of Visit: 02/07/2021   SUBJECTIVE:   HPI:  Marlee presents today for ***  Hypertension - currently taking losartan 25mg  daily, metoprolol XL 100mg  daily. Tolerating well. ***  Hyperlipidemia - currently taking atorvastatin 40mg  daily. No issues with this medication.  ***  Seizure disorder - currently taking vimpat 150mg  twice daily. Follows regularly with neurology. Due to follow up again with them in late April. ***   OBJECTIVE:   There were no vitals taken for this visit. Gen: *** HEENT: *** Heart: *** Lungs: *** Neuro: *** Ext: ***  ASSESSMENT/PLAN:   Health maintenance:  -flu shot - *** -COVID booster - *** -advised due for colonoscopy in August ***  No problem-specific Assessment & Plan notes found for this encounter.  FOLLOW UP: Follow up in *** for ***  [place] J. Ardelia Mems, [place]

## 2021-02-07 ENCOUNTER — Ambulatory Visit: Payer: Medicare Other | Admitting: Family Medicine

## 2021-03-21 ENCOUNTER — Ambulatory Visit: Payer: Medicare Other | Admitting: Family Medicine

## 2021-03-29 ENCOUNTER — Other Ambulatory Visit: Payer: Self-pay

## 2021-03-29 ENCOUNTER — Telehealth (HOSPITAL_COMMUNITY): Payer: Medicare Other | Admitting: Psychiatry

## 2021-03-30 ENCOUNTER — Other Ambulatory Visit (HOSPITAL_COMMUNITY): Payer: Self-pay | Admitting: *Deleted

## 2021-03-30 ENCOUNTER — Telehealth (INDEPENDENT_AMBULATORY_CARE_PROVIDER_SITE_OTHER): Payer: Medicare Other | Admitting: Psychiatry

## 2021-03-30 ENCOUNTER — Encounter (HOSPITAL_COMMUNITY): Payer: Self-pay | Admitting: Psychiatry

## 2021-03-30 ENCOUNTER — Telehealth (HOSPITAL_COMMUNITY): Payer: Medicare Other | Admitting: Psychiatry

## 2021-03-30 ENCOUNTER — Other Ambulatory Visit: Payer: Self-pay

## 2021-03-30 DIAGNOSIS — Z79899 Other long term (current) drug therapy: Secondary | ICD-10-CM

## 2021-03-30 DIAGNOSIS — F250 Schizoaffective disorder, bipolar type: Secondary | ICD-10-CM

## 2021-03-30 DIAGNOSIS — F5101 Primary insomnia: Secondary | ICD-10-CM | POA: Diagnosis not present

## 2021-03-30 DIAGNOSIS — F411 Generalized anxiety disorder: Secondary | ICD-10-CM | POA: Diagnosis not present

## 2021-03-30 MED ORDER — QUETIAPINE FUMARATE 400 MG PO TABS: 400.0000 mg | ORAL_TABLET | Freq: Every day | ORAL | 0 refills | Status: DC

## 2021-03-30 MED ORDER — BELSOMRA 10 MG PO TABS: 10.0000 mg | ORAL_TABLET | Freq: Every day | ORAL | 2 refills | Status: DC

## 2021-03-30 MED ORDER — AMITRIPTYLINE HCL 100 MG PO TABS: 200.0000 mg | ORAL_TABLET | Freq: Every day | ORAL | 2 refills | Status: DC

## 2021-03-30 NOTE — Progress Notes (Signed)
Virtual Visit via Telephone Note  I connected with [name] on 03/30/21 at  4:00 PM EDT by telephone and verified that I am speaking with the correct person using two identifiers.  Location: Patient: Home Provider: Home Office   I discussed the limitations, risks, security and privacy concerns of performing an evaluation and management service by telephone and the availability of in person appointments. I also discussed with the patient that there may be a patient responsible charge related to this service. The patient expressed understanding and agreed to proceed.   History of Present Illness: Patient is evaluated by phone session.  He reported his phone has been causing some issues and he missed yesterday's appointment.  He is taking his medication and reported it is helping and he is able to sleep 6 to 7 hours.  He had a visit to his PCP at [facility] but he was not satisfied and now like to go back to PCP at [facility].  However he has not scheduled an appointment.  He do not recall having blood work at [facility].  Patient has residual paranoia and chronic hallucinations but stable.  He had tried multiple medication but he feels current medicine seems to be working the best.  He is able to manage his symptoms.  He is still does not go outside by himself.  His male friend [name] helps with groceries and doctor's appointment.  He does walk within the apartment building.  He has no tremors shakes or any EPS.  He does not want to change the medication.  We have emphasized to have his blood work since he is taking the medication that can cause metabolic syndrome.  He reported his appetite is okay and his weight is unchanged from the past.  He is not interested in therapy.  Denies any crying spells, suicidal thoughts, severe mood swings.  Past Psychiatric History: H/Odepression,paranoia, suicidal attempt, delusions and anxiety.H/Oatmultipleinpatient. H/Ocutting his  testicles because does not want to remain depressed. Thoughts of inhaling helium to end his life.Last inpatientAugust 2016. D/Con Seroquel 900 mg and amitriptyline 400 mgat bedtime.[place] [facility]. SSRI caused reactions.Triednortriptyline, Haldol, Geodon, Seroquel, lithium, Prozac, Wellbutrin, Paxil,Klonopin,Ativan, Lexapro,Zoloft, abilifyand Lamictal. No H/Omania or drug use. We did GeneSight testing and results are in the chart. Only antipsychotic medication that is unfavorable section is Saphris and Navane. Most of the SSRIs are also unfavorable section.   Psychiatric Specialty Exam: Physical Exam  Review of Systems  Weight 208 lb (94.3 kg).There is no height or weight on file to calculate BMI.  General Appearance: NA  Eye Contact:  NA  Speech:  Slow  Volume:  Decreased  Mood:  Dysphoric  Affect:  NA  Thought Process:  Descriptions of Associations: Intact  Orientation:  Full (Time, Place, and Person)  Thought Content:  Hallucinations: Auditory Chronic hallucination and paranoia but no command auditory hallucination. and Paranoia  Suicidal Thoughts:  No  Homicidal Thoughts:  No  Memory:  Immediate;   Fair Recent;   Fair Remote;   Fair  Judgement:  Fair  Insight:  Shallow  Psychomotor Activity:  NA  Concentration:  Concentration: Fair and Attention Span: Fair  Recall:  [employer] of Knowledge:  Fair  Language:  Fair  Akathisia:  No  Handed:  Right  AIMS (if indicated):     Assets:  Communication Skills Desire for Improvement Housing Social Support  ADL's:  Intact  Cognition:  WNL  Sleep:   6 to 7 hours  Assessment and Plan: Schizoaffective disorder, bipolar type.  Generalized anxiety disorder.  Primary insomnia.  Patient has residual symptoms but does not want to change the medication because he.  Among all the medicine he had tried in the past his current medicine is better and his symptoms are manageable.  It is unclear why he does  not want to keep appointment with the PCP at [place] but now wants to come back to [facility] family practice.  I encouraged to have his blood work done as patient taking high-dose Seroquel and we do not have any recent blood work.  Patient agreed to have blood work in few days.  I will continue Seroquel 400 mg at bedtime but we will provide 30-day supply until he had blood work.  Continue Belsomra 10 mg at bedtime and amitriptyline 200 mg at bedtime.  Encourage to keep appointment.  I also discussed to have his next appointment in person as patient having issues with his telephone.  He agreed with the plan.  I recommend to call us back if is any question or any concern.  Follow-up in 3 months.  Follow Up Instructions:    I discussed the assessment and treatment plan with the patient. The patient was provided an opportunity to ask questions and all were answered. The patient agreed with the plan and demonstrated an understanding of the instructions.   The patient was advised to call back or seek an in-person evaluation if the symptoms worsen or if the condition fails to improve as anticipated.  I provided 18 minutes of non-face-to-face time during this encounter.   Kathlee Nations, MD

## 2021-04-04 ENCOUNTER — Other Ambulatory Visit: Payer: Self-pay | Admitting: Family Medicine

## 2021-04-04 DIAGNOSIS — I10 Essential (primary) hypertension: Secondary | ICD-10-CM

## 2021-04-13 ENCOUNTER — Ambulatory Visit (INDEPENDENT_AMBULATORY_CARE_PROVIDER_SITE_OTHER): Payer: Medicare Other | Admitting: Family Medicine

## 2021-04-13 ENCOUNTER — Encounter: Payer: Self-pay | Admitting: Family Medicine

## 2021-04-13 ENCOUNTER — Other Ambulatory Visit: Payer: Self-pay

## 2021-04-13 VITALS — BP 123/79 | HR 70 | Ht 71.0 in | Wt 216.0 lb

## 2021-04-13 DIAGNOSIS — R413 Other amnesia: Secondary | ICD-10-CM | POA: Diagnosis not present

## 2021-04-13 DIAGNOSIS — F259 Schizoaffective disorder, unspecified: Secondary | ICD-10-CM

## 2021-04-13 DIAGNOSIS — F5104 Psychophysiologic insomnia: Secondary | ICD-10-CM | POA: Diagnosis not present

## 2021-04-13 DIAGNOSIS — G40909 Epilepsy, unspecified, not intractable, without status epilepticus: Secondary | ICD-10-CM

## 2021-04-13 MED ORDER — LACOSAMIDE 150 MG PO TABS: 1.0000 | ORAL_TABLET | Freq: Two times a day (BID) | ORAL | 5 refills | Status: DC

## 2021-04-13 NOTE — Progress Notes (Signed)
I have read the note, and I agree with the clinical assessment and plan.  [name] K [name]   

## 2021-04-13 NOTE — Progress Notes (Signed)
Chief Complaint  Patient presents with  . Follow-up    Rm 1 alone Pt is well, no seizures      HISTORY OF PRESENT ILLNESS: 04/13/21 ALL:  [name] is a 65 y.o. male here today for follow up for seizures. He continues Vimpat and tolerating well. No recent seizure activity. He continues close follow up with psychiatry and PCP. He reports sleeping well. Memory stable. No new or worsening concerns, today.    HISTORY (copied from Dr [name]' previous note)  [name] is a 65 year old left-handed white male with a history of schizoaffective disorder, and some memory issues.  He does have history of seizures but he has done quite well on Vimpat.  He has not had any further seizures.  He has a chronic issue with insomnia.  He has had neuropsychological testing that did not show an organic dementia, he was felt that he had executive function disorder that was in part related to depression, anxiety, and sleep problems.  The patient is also on multiple medications that are psychoactive including Vimpat, amitriptyline, Seroquel, and oxybutynin.  The patient claims that it takes about 2 hours for him to get to sleep even though he takes 200 mg of amitriptyline, 5 mg of Ambien, and 400 mg of Seroquel at night.  The patient returns for an evaluation.  He is currently not operating a motor vehicle but is planning on trying to get his driver's license back.   REVIEW OF SYSTEMS: Out of a complete 14 system review of symptoms, the patient complains only of the following symptoms,memory loss, insomnia and all other reviewed systems are negative.    ALLERGIES: Allergies  Allergen Reactions  . Codeine Nausea And Vomiting  . Sulfa Antibiotics Nausea And Vomiting  . Sulfasalazine Nausea And Vomiting     HOME MEDICATIONS: Outpatient Medications Prior to Visit  Medication Sig Dispense Refill  . acetaminophen (TYLENOL) 500 MG tablet Take 1,000 mg by mouth every 6 (six) hours as needed for mild pain  or moderate pain.    Marland Kitchen amitriptyline (ELAVIL) 100 MG tablet Take 2 tablets (200 mg total) by mouth at bedtime. 60 tablet 2  . atorvastatin (LIPITOR) 40 MG tablet TAKE 1 TABLET BY MOUTH EVERY DAY 90 tablet 3  . losartan (COZAAR) 25 MG tablet TAKE 1 TABLET BY MOUTH EVERY DAY 90 tablet 1  . metoprolol succinate (TOPROL-XL) 100 MG 24 hr tablet TAKE 2 TABLETS BY MOUTH AT BEDTIME 180 tablet 0  . Multiple Vitamin (MULTIVITAMIN WITH MINERALS) TABS tablet Take 1 tablet by mouth daily.    . polyethylene glycol powder ([employer] PURELAX) powder TAKE 17 GRAMS 2 TIMES A DAY AS NEEDED FOR CONSTIPATION. MIX INTO 8 OF FLUID AND DRINK (Patient taking differently: Take 17 g by mouth daily. Mix into 8 oz of fluid and drink) 510 g 1  . QUEtiapine (SEROQUEL) 400 MG tablet Take 1 tablet (400 mg total) by mouth at bedtime. 30 tablet 0  . Suvorexant (BELSOMRA) 10 MG TABS Take 10 mg by mouth at bedtime. 30 tablet 2  . testosterone cypionate (DEPOTESTOSTERONE CYPIONATE) 200 MG/ML injection SMARTSIG:1 Milliliter(s) IM Every 2 Weeks    . VIMPAT 150 MG TABS TAKE 1 TABLET BY MOUTH TWICE A DAY 60 tablet 5  . oxybutynin (DITROPAN) 5 MG tablet Take 5 mg by mouth at bedtime. (Patient not taking: Reported on 04/13/2021)     No facility-administered medications prior to visit.     PAST MEDICAL HISTORY: Past Medical  History:  Diagnosis Date  . Anxiety   . Chronic insomnia 04/14/2020  . Constipation   . Depression   . Dyslipidemia   . GERD (gastroesophageal reflux disease)   . Hearing loss   . History of echocardiogram    Echo 8/18: EF 50-55, normal wall motion, grade 1 diastolic dysfunction, mild MR  . Hypertension   . Memory difficulty 10/25/2017  . Memory loss   . MRSA carrier   . Schizoaffective disorder   . Seizures ([place])      PAST SURGICAL HISTORY: Past Surgical History:  Procedure Laterality Date  . CARPAL TUNNEL RELEASE    . COLONOSCOPY  2010  . ORIF ANKLE FRACTURE Left 11/26/2018   Procedure: OPEN REDUCTION  INTERNAL FIXATION (ORIF) ANKLE FRACTURE;  Surgeon: Hiram Gash, MD;  Location: [facility];  Service: Orthopedics;  Laterality: Left;  . ORIF SHOULDER FRACTURE    . self orchectomy    . SHOULDER CLOSED REDUCTION Left 09/16/2013   Procedure: CLOSED REDUCTION SHOULDER;  Surgeon: Mauri Pole, MD;  Location: [facility];  Service: Orthopedics;  Laterality: Left;  . SHOULDER HEMI-ARTHROPLASTY Left 09/18/2013   Procedure: LEFT SHOULDER HEMI-ARTHROPLASTY;  Surgeon: Augustin Schooling, MD;  Location: [facility];  Service: Orthopedics;  Laterality: Left;  . TIBIA IM NAIL INSERTION Left 11/26/2018   Procedure: INTRAMEDULLARY (IM) NAIL TIBIAL;  Surgeon: Hiram Gash, MD;  Location: [facility];  Service: Orthopedics;  Laterality: Left;     FAMILY HISTORY: Family History  Problem Relation Age of Onset  . Stroke Father        Living at 70  . Stroke Mother        Stroke in late 4's. Died in her 85's  . Alcohol abuse Brother   . Alcohol abuse Brother   . Diabetes Brother   . Colon cancer Neg Hx   . Esophageal cancer Neg Hx   . Stomach cancer Neg Hx   . Rectal cancer Neg Hx      SOCIAL HISTORY: Social History   Socioeconomic History  . Marital status: Single    Spouse name: Not on file  . Number of children: 0  . Years of education: 81  . Highest education level: Not on file  Occupational History  . Occupation: disabled  Tobacco Use  . Smoking status: Never Smoker  . Smokeless tobacco: Never Used  Vaping Use  . Vaping Use: Never used  Substance and Sexual Activity  . Alcohol use: No    Alcohol/week: 0.0 standard drinks  . Drug use: No  . Sexual activity: Not Currently    Partners: Female    Birth control/protection: None  Other Topics Concern  . Not on file  Social History Narrative   Patient does not drink caffeine.   Patient is left handed.   Social Determinants of Health   Financial Resource Strain: Not on file  Food Insecurity: Not on file  Transportation Needs: Not on file  Physical  Activity: Not on file  Stress: Not on file  Social Connections: Not on file  Intimate Partner Violence: Not on file      PHYSICAL EXAM  Vitals:   04/13/21 1246  BP: 123/79  Pulse: 70  Weight: 216 lb (98 kg)  Height: 5\' 11"  (1.803 m)   Body mass index is 30.13 kg/m.   Generalized: Well developed, in no acute distress  Cardiology: normal rate and rhythm, no murmur auscultated  Respiratory: clear to auscultation bilaterally    Neurological examination  Mentation: Alert oriented to time, place, history taking. Follows all commands speech and language fluent Cranial nerve II-XII: Pupils were equal round reactive to light. Extraocular movements were full, visual field were full on confrontational test. Facial sensation and strength were normal. Head turning and shoulder shrug  were normal and symmetric. Motor: The motor testing reveals 5 over 5 strength of all 4 extremities. Good symmetric motor tone is noted throughout.   Gait and station: Gait is normal.     DIAGNOSTIC DATA (LABS, IMAGING, TESTING) - I reviewed patient records, labs, notes, testing and imaging myself where available.  Lab Results  Component Value Date   WBC 8.7 06/16/2019   HGB 17.2 06/16/2019   HCT 50.8 06/16/2019   MCV 92 06/16/2019   PLT 192 06/16/2019      Component Value Date/Time   NA 142 03/17/2020 1409   K 4.6 03/17/2020 1409   CL 105 03/17/2020 1409   CO2 19 (L) 03/17/2020 1409   GLUCOSE 111 (H) 03/17/2020 1409   GLUCOSE 118 (H) 01/24/2019 0820   BUN 17 03/17/2020 1409   CREATININE 0.98 03/17/2020 1409   CREATININE 0.87 02/18/2017 1413   CALCIUM 9.0 03/17/2020 1409   PROT 6.4 06/16/2019 1035   ALBUMIN 4.4 06/16/2019 1035   AST 15 06/16/2019 1035   ALT 18 06/16/2019 1035   ALKPHOS 104 06/16/2019 1035   BILITOT 0.3 06/16/2019 1035   GFRNONAA 81 03/17/2020 1409   GFRNONAA >89 02/18/2017 1413   GFRAA 93 03/17/2020 1409   GFRAA >89 02/18/2017 1413   Lab Results  Component Value  Date   CHOL 105 03/17/2020   HDL 35 (L) 03/17/2020   LDLCALC 38 03/17/2020   TRIG 199 (H) 03/17/2020   CHOLHDL 3.0 03/17/2020   Lab Results  Component Value Date   HGBA1C 5.7 (A) 10/05/2019   Lab Results  Component Value Date   VITAMINB12 422 10/25/2017   Lab Results  Component Value Date   TSH 3.430 03/17/2020    MMSE - Mini Mental State Exam 10/07/2019 05/15/2018 10/25/2017  Orientation to time 4 5 4   Orientation to Place 5 4 5   Registration 3 3 3   Attention/ Calculation 3 3 2   Recall 3 3 2   Language- name 2 objects 2 2 2   Language- repeat 1 1 1   Language- follow 3 step command 3 3 3   Language- read & follow direction 1 1 1   Write a sentence 1 1 1   Copy design 1 1 1   Copy design-comments named 10 animals - -  Total score 27 27 25      Montreal Cognitive Assessment  02/08/2020  Visuospatial/ Executive (0/5) 2  Naming (0/3) 3  Attention: Read list of digits (0/2) 2  Attention: Read list of letters (0/1) 1  Attention: Serial 7 subtraction starting at 100 (0/3) 2  Language: Repeat phrase (0/2) 2  Language : Fluency (0/1) 1  Abstraction (0/2) 1  Delayed Recall (0/5) 3  Orientation (0/6) 5  Total 22  Adjusted Score (based on education) 22     ASSESSMENT AND PLAN  65 y.o. year old male  has a past medical history of Anxiety, Chronic insomnia (04/14/2020), Constipation, Depression, Dyslipidemia, GERD (gastroesophageal reflux disease), Hearing loss, History of echocardiogram, Hypertension, Memory difficulty (10/25/2017), Memory loss, MRSA carrier, Schizoaffective disorder, and Seizures ([place]). here with     Seizure disorder ([place])  Schizoaffective disorder, unspecified type ([place])  Chronic insomnia  Memory difficulty  Nester reports doing well, today. No seizures. He  is tolerating Vimpat. We will continue 150mg  twice daily. He is sleeping well and feels memory is stable. He will continue close follow up with PCP and psychiatry. Healthy lifestyle habits encouraged. He  will follow up in 1 year, sooner if needed.   No orders of the defined types were placed in this encounter.    Meds ordered this encounter  Medications  . Lacosamide (VIMPAT) 150 MG TABS    Sig: Take 1 tablet (150 mg total) by mouth 2 (two) times daily.    Dispense:  60 tablet    Refill:  5    This request is for a new prescription for a controlled substance as required by Federal/State law..    Order Specific Question:   Supervising Provider    Answer:   Melvenia Beam [id number]     Debbora Presto, MSN, FNP-C 04/13/2021, 1:09 PM  [facility] [address] [place], [place] [address] [phone]

## 2021-04-13 NOTE — Patient Instructions (Addendum)
Below is our plan:  We will continue Vimpat 150mg  twice daily.   Please make sure you are staying well hydrated. I recommend 50-60 ounces daily. Well balanced diet and regular exercise encouraged. Consistent sleep schedule with 6-8 hours recommended.   Please continue follow up with care team as directed.   Follow up in 1 year   You may receive a survey regarding today's visit. I encourage you to leave honest feed back as I do use this information to improve patient care. Thank you for seeing me today!      Seizure, Adult A seizure is a sudden burst of abnormal electrical and chemical activity in the brain. Seizures usually last from 30 seconds to 2 minutes.  What are the causes? Common causes of this condition include:  Fever or infection.  Problems that affect the brain. These may include: ? A brain or head injury. ? Bleeding in the brain. ? A brain tumor.  Low levels of blood sugar or salt.  Kidney problems or liver problems.  Conditions that are passed from parent to child (are inherited).  Problems with a substance, such as: ? Having a reaction to a drug or a medicine. ? Stopping the use of a substance all of a sudden (withdrawal).  A stroke.  Disorders that affect how you develop. Sometimes, the cause may not be known.  What increases the risk?  Having someone in your family who has epilepsy. In this condition, seizures happen again and again over time. They have no clear cause.  Having had a tonic-clonic seizure before. This type of seizure causes you to: ? Tighten the muscles of the whole body. ? Lose consciousness.  Having had a head injury or strokes before.  Having had a lack of oxygen at birth. What are the signs or symptoms? There are many types of seizures. The symptoms vary depending on the type of seizure you have. Symptoms during a seizure  Shaking that you cannot control (convulsions) with fast, jerky movements of muscles.  Stiffness of  the body.  Breathing problems.  Feeling mixed up (confused).  Staring or not responding to sound or touch.  Head nodding.  Eyes that blink, flutter, or move fast.  Drooling, grunting, or making clicking sounds with your mouth  Losing control of when you pee or poop. Symptoms before a seizure  Feeling afraid, nervous, or worried.  Feeling like you may vomit.  Feeling like: ? You are moving when you are not. ? Things around you are moving when they are not.  Feeling like you saw or heard something before (dj vu).  Odd tastes or smells.  Changes in how you see. You may see flashing lights or spots. Symptoms after a seizure  Feeling confused.  Feeling sleepy.  Headache.  Sore muscles. How is this treated? If your seizure stops on its own, you will not need treatment. If your seizure lasts longer than 5 minutes, you will normally need treatment. Treatment may include:  Medicines given through an IV tube.  Avoiding things, such as medicines, that are known to cause your seizures.  Medicines to prevent seizures.  A device to prevent or control seizures.  Surgery.  A diet low in carbohydrates and high in fat (ketogenic diet). Follow these instructions at home: Medicines  Take over-the-counter and prescription medicines only as told by your doctor.  Avoid foods or drinks that may keep your medicine from working, such as alcohol. Activity  Follow instructions about driving, swimming, or  doing things that would be dangerous if you had another seizure. Wait until your doctor says it is safe for you to do these things.  If you live in the [place] ask your local department of motor vehicles when you can drive.  Get a lot of rest. Teaching others  Teach friends and family what to do when you have a seizure. They should: ? Help you get down to the ground. ? Protect your head and body. ? Loosen any clothing around your neck. ? Turn you on your side. ? Know  whether or not you need emergency care. ? Stay with you until you are better.  Also, tell them what not to do if you have a seizure. Tell them: ? They should not hold you down. ? They should not put anything in your mouth.   General instructions  Avoid anything that gives you seizures.  Keep a seizure diary. Write down: ? What you remember about each seizure. ? What you think caused each seizure.  Keep all follow-up visits. Contact a doctor if:  You have another seizure or seizures. Call the doctor each time you have a seizure.  The pattern of your seizures changes.  You keep having seizures with treatment.  You have symptoms of being sick or having an infection.  You are not able to take your medicine. Get help right away if:  You have any of these problems: ? A seizure that lasts longer than 5 minutes. ? Many seizures in a row and you do not feel better between seizures. ? A seizure that makes it harder to breathe. ? A seizure and you can no longer speak or use part of your body.  You do not wake up right after a seizure.  You get hurt during a seizure.  You feel confused or have pain right after a seizure. These symptoms may be an emergency. Get help right away. Call your local emergency services (911 in the [place]  Do not wait to see if the symptoms will go away.  Do not drive yourself to the hospital. Summary  A seizure is a sudden burst of abnormal electrical and chemical activity in the brain. Seizures normally last from 30 seconds to 2 minutes.  Causes of seizures include illness, injury to the head, low levels of blood sugar or salt, and certain conditions.  Most seizures will stop on their own in less than 5 minutes. Seizures that last longer than 5 minutes are a medical emergency and need treatment right away.  Many medicines are used to treat seizures. Take over-the-counter and prescription medicines only as told by your doctor. This information is not  intended to replace advice given to you by your health care provider. Make sure you discuss any questions you have with your health care provider. Document Revised: 06/10/2020 Document Reviewed: 06/10/2020 Elsevier Patient Education  [place].

## 2021-05-20 ENCOUNTER — Other Ambulatory Visit (HOSPITAL_COMMUNITY): Payer: Self-pay | Admitting: Psychiatry

## 2021-05-20 DIAGNOSIS — F411 Generalized anxiety disorder: Secondary | ICD-10-CM

## 2021-05-26 ENCOUNTER — Telehealth (HOSPITAL_COMMUNITY): Payer: Self-pay | Admitting: *Deleted

## 2021-05-26 LAB — CBC WITH DIFFERENTIAL/PLATELET
Basophils Absolute: 0.1 10*3/uL (ref 0.0–0.2)
Basos: 1 %
EOS (ABSOLUTE): 0.1 10*3/uL (ref 0.0–0.4)
Eos: 2 %
Hematocrit: 45.1 % (ref 37.5–51.0)
Hemoglobin: 15.8 g/dL (ref 13.0–17.7)
Immature Grans (Abs): 0.1 10*3/uL (ref 0.0–0.1)
Immature Granulocytes: 1 %
Lymphocytes Absolute: 1.4 10*3/uL (ref 0.7–3.1)
Lymphs: 20 %
MCH: 32.8 pg (ref 26.6–33.0)
MCHC: 35 g/dL (ref 31.5–35.7)
MCV: 94 fL (ref 79–97)
Monocytes Absolute: 0.4 10*3/uL (ref 0.1–0.9)
Monocytes: 5 %
Neutrophils Absolute: 5.2 10*3/uL (ref 1.4–7.0)
Neutrophils: 71 %
Platelets: 194 10*3/uL (ref 150–450)
RBC: 4.81 x10E6/uL (ref 4.14–5.80)
RDW: 13.3 % (ref 11.6–15.4)
WBC: 7.2 10*3/uL (ref 3.4–10.8)

## 2021-05-26 LAB — TSH: TSH: 1.24 u[IU]/mL (ref 0.450–4.500)

## 2021-05-26 LAB — CMP14+EGFR
ALT: 45 IU/L — ABNORMAL HIGH (ref 0–44)
AST: 26 IU/L (ref 0–40)
Albumin/Globulin Ratio: 1.8 (ref 1.2–2.2)
Albumin: 4.4 g/dL (ref 3.8–4.8)
Alkaline Phosphatase: 93 IU/L (ref 44–121)
BUN/Creatinine Ratio: 12 (ref 10–24)
BUN: 12 mg/dL (ref 8–27)
Bilirubin Total: 0.3 mg/dL (ref 0.0–1.2)
CO2: 22 mmol/L (ref 20–29)
Calcium: 9.4 mg/dL (ref 8.6–10.2)
Chloride: 100 mmol/L (ref 96–106)
Creatinine, Ser: 1.04 mg/dL (ref 0.76–1.27)
Globulin, Total: 2.4 g/dL (ref 1.5–4.5)
Glucose: 182 mg/dL — ABNORMAL HIGH (ref 65–99)
Potassium: 4.7 mmol/L (ref 3.5–5.2)
Sodium: 139 mmol/L (ref 134–144)
Total Protein: 6.8 g/dL (ref 6.0–8.5)
eGFR: 79 mL/min/{1.73_m2} (ref >59)

## 2021-05-26 LAB — LIPID PANEL
Chol/HDL Ratio: 2.3 ratio (ref 0.0–5.0)
Cholesterol, Total: 104 mg/dL (ref 100–199)
HDL: 45 mg/dL (ref >39)
LDL Chol Calc (NIH): 29 mg/dL (ref 0–99)
Triglycerides: 184 mg/dL — ABNORMAL HIGH (ref 0–149)
VLDL Cholesterol Cal: 30 mg/dL (ref 5–40)

## 2021-05-26 LAB — HEMOGLOBIN A1C
Est. average glucose Bld gHb Est-mCnc: 148 mg/dL
Hgb A1c MFr Bld: 6.8 % — ABNORMAL HIGH (ref 4.8–5.6)

## 2021-05-26 NOTE — Telephone Encounter (Signed)
Labs received by [facility] resulted 05/1021.pt positive for elevated Triglycerides @ 184; Glucose 182: HgBA1C 6.8; and ALT elevated @ 45.

## 2021-05-26 NOTE — Telephone Encounter (Signed)
I will discuss the blood results on his next appointment.

## 2021-05-30 ENCOUNTER — Ambulatory Visit (INDEPENDENT_AMBULATORY_CARE_PROVIDER_SITE_OTHER): Payer: Medicare Other | Admitting: Student in an Organized Health Care Education/Training Program

## 2021-05-30 ENCOUNTER — Other Ambulatory Visit: Payer: Self-pay

## 2021-05-30 VITALS — BP 136/88 | HR 80 | Ht 71.0 in | Wt 211.0 lb

## 2021-05-30 DIAGNOSIS — L989 Disorder of the skin and subcutaneous tissue, unspecified: Secondary | ICD-10-CM

## 2021-05-30 DIAGNOSIS — L299 Pruritus, unspecified: Secondary | ICD-10-CM

## 2021-05-30 DIAGNOSIS — H9193 Unspecified hearing loss, bilateral: Secondary | ICD-10-CM

## 2021-05-30 NOTE — Progress Notes (Addendum)
   SUBJECTIVE:   CHIEF COMPLAINT / HPI: spots on legs and arm, itchy ears  Skin lesions: Legs- lesions for 1 year or more as he is not sure when they first appeared. He's not familiar with any trauma specifically to that area. 6-7 lesions erythematous lesions on left calf. Not located anywhere else. No itching or pain. Tried concealer but no topical treatments.  Arms- left forearm. 2 months. Scraped off with nail and started bleeding and then grew back. Tries to avoid sunlight. He gets burned very easily. Had similar smaller lesions on other parts of arm but they have been scraped off and not returned.   Ear concerns- scratches them for 3 years. They are itchy. No discharge. Does not put anything in his ears and hasn't used a qtip in over a year. Not worse at certain times of day or years. Hasn't recognized any new hearing problems but his partner thinks that he does.   OBJECTIVE:   BP 136/88   Pulse 80   Ht 5\' 11"  (1.803 m)   Wt 211 lb (95.7 kg)   SpO2 97%   BMI 29.43 kg/m   Physical Exam Vitals and nursing note reviewed.  Constitutional:      General: He is not in acute distress.    Appearance: He is not toxic-appearing.  HENT:     Right Ear: Hearing normal. There is impacted cerumen.     Left Ear: Hearing normal. There is impacted cerumen.     Ears:     Comments: Dry skin in ear canals Pulmonary:     Effort: Pulmonary effort is normal.  Skin:    Capillary Refill: Capillary refill takes less than 2 seconds.       Neurological:     Mental Status: He is alert and oriented to person, place, and time.        ASSESSMENT/PLAN:   Skin lesion Likely seborrheic keratosis on arm. Consider SCC with bleeding and rapid regrowth.  Patient elected for punch biopsy today which was sent for pathology. Patient tolerated procedure well.  Markings on leg appear to be well healed scratches from unknown trauma but are not bothersome. Continue to monitor.  Ear itching Patient's primary  concern for ears is itching. His wife's primary concern is his hearing.  Exam incomplete today as there is cerumen impaction bilaterally but external canal appeared normal.  Recommended debrox at home and follow up with audiology for hearing evaluation. Probably impacted by the impaction.  [phone] [name] to schedule audiology.   After informed written consent was obtained, using Betadine for cleansing and 1% Lidocaine with epinephrine for anesthetic, with sterile technique a 2 mm punch biopsy was used to obtain a biopsy specimen of the lesion. Hemostasis was obtained by pressure. Antibiotic dressing is applied, and wound care instructions provided. Be alert for any signs of cutaneous infection. The specimen is labeled and sent to pathology for evaluation. The procedure was well tolerated without complications.   [place]

## 2021-05-30 NOTE — Patient Instructions (Signed)
It was a pleasure to see you today!  To summarize our discussion for this visit: Please use debrox or other similar ear drops to soften the wax and flush the wax from your ears prior to your audiology exam.  We are taking a biopsy of your arm and will let you know the results.  We took a picture of the markings on your leg to be able to monitor them throughout checkups but they look like healed scratches and would not need treatment today.  Some additional health maintenance measures we should update are: Health Maintenance Due  Topic Date Due   Zoster Vaccines- Shingrix (1 of 2) Never done   COVID-19 Vaccine (3 - Pfizer risk series) 05/16/2020     Please return to our clinic to see me as needed.  Call the clinic at [phone] if your symptoms worsen or you have any concerns.   Thank you for allowing me to take part in your care,  Dr. Doristine Mango

## 2021-06-01 DIAGNOSIS — L299 Pruritus, unspecified: Secondary | ICD-10-CM | POA: Insufficient documentation

## 2021-06-01 DIAGNOSIS — L989 Disorder of the skin and subcutaneous tissue, unspecified: Secondary | ICD-10-CM | POA: Insufficient documentation

## 2021-06-01 NOTE — Assessment & Plan Note (Signed)
Patient's primary concern for ears is itching. His wife's primary concern is his hearing.  Exam incomplete today as there is cerumen impaction bilaterally but external canal appeared normal.  Recommended debrox at home and follow up with audiology for hearing evaluation. Probably impacted by the impaction.

## 2021-06-01 NOTE — Assessment & Plan Note (Signed)
Likely seborrheic keratosis on arm. Consider SCC with bleeding and rapid regrowth.  Patient elected for punch biopsy today which was sent for pathology. Patient tolerated procedure well.  Markings on leg appear to be well healed scratches from unknown trauma but are not bothersome. Continue to monitor.

## 2021-06-06 ENCOUNTER — Telehealth: Payer: Self-pay | Admitting: Student in an Organized Health Care Education/Training Program

## 2021-06-06 NOTE — Telephone Encounter (Signed)
Called patient to discuss his punch biopsy results- SCC in situ of left forearm.  Scheduled him for excision 7/14 in derm clinic.  Patient did not have any further questions or concerns at this time.

## 2021-06-15 ENCOUNTER — Encounter (HOSPITAL_COMMUNITY): Payer: Self-pay | Admitting: Psychiatry

## 2021-06-15 ENCOUNTER — Telehealth (INDEPENDENT_AMBULATORY_CARE_PROVIDER_SITE_OTHER): Payer: Medicare Other | Admitting: Psychiatry

## 2021-06-15 DIAGNOSIS — F250 Schizoaffective disorder, bipolar type: Secondary | ICD-10-CM

## 2021-06-15 DIAGNOSIS — F5101 Primary insomnia: Secondary | ICD-10-CM | POA: Diagnosis not present

## 2021-06-15 DIAGNOSIS — F411 Generalized anxiety disorder: Secondary | ICD-10-CM

## 2021-06-15 MED ORDER — QUETIAPINE FUMARATE 400 MG PO TABS: 400.0000 mg | ORAL_TABLET | Freq: Every day | ORAL | 0 refills | Status: DC

## 2021-06-15 MED ORDER — AMITRIPTYLINE HCL 100 MG PO TABS: 200.0000 mg | ORAL_TABLET | Freq: Every day | ORAL | 2 refills | Status: DC

## 2021-06-15 MED ORDER — BELSOMRA 10 MG PO TABS: 10.0000 mg | ORAL_TABLET | Freq: Every day | ORAL | 2 refills | Status: DC

## 2021-06-15 NOTE — Progress Notes (Signed)
Virtual Visit via Telephone Note  I connected with [name] on 06/15/21 at  3:20 PM EDT by telephone and verified that I am speaking with the correct person using two identifiers.  Location: Patient: In Car Provider: Home Office   I discussed the limitations, risks, security and privacy concerns of performing an evaluation and management service by telephone and the availability of in person appointments. I also discussed with the patient that there may be a patient responsible charge related to this service. The patient expressed understanding and agreed to proceed.   History of Present Illness: Patient is evaluated by phone session.  He had a visit with his PCP and neurologist.  He had a blood work and his hemoglobin A1c is 6.8.  His PCP did not address high blood sugar.  He is concerned because sometimes he is forgetful.  He has residual paranoia but denies any hallucination, suicidal thoughts or homicidal thoughts.  His male friend helps him to the doctor's appointment.  He usually does not leave the house unless it is important or has some doctor's commitment.  His appetite is okay.  His energy level is fair.  He sleeps at least 7 8 hours.  Denies any panic attack.  He is taking Seroquel along with amitriptyline and Belsomra.  He denies any anger, mania but admitted chronic paranoia and does not leave the house.  He has no tremors shakes or any EPS.  Past Psychiatric History:  H/O depression, paranoia, suicidal attempt, delusions and anxiety. H/O at multiple inpatient.  H/O cutting his testicles because does not want to remain depressed.  Thoughts of inhaling helium to end his life. Last inpatient August 2016.  D/C on Seroquel 900 mg and amitriptyline 400 mg at bedtime. Saw at [facility] and [name]. SSRI caused reactions. Tried nortriptyline, Haldol, Geodon, Seroquel, lithium, Prozac, Wellbutrin, Paxil, Klonopin, Ativan, Lexapro, Zoloft, abilify and Lamictal.  No H/O mania or drug use.  We  did GeneSight testing and results are in the chart.  Only antipsychotic medication that is unfavorable section is Saphris and Navane.  Most of the SSRIs are also unfavorable section.    Recent Results (from the past 2160 hour(s))  CBC with Differential     Status: None   Collection Time: 05/25/21  2:40 PM  Result Value Ref Range   WBC 7.2 3.4 - 10.8 x10E3/uL   RBC 4.81 4.14 - 5.80 x10E6/uL   Hemoglobin 15.8 13.0 - 17.7 g/dL   Hematocrit 45.1 37.5 - 51.0 %   MCV 94 79 - 97 fL   MCH 32.8 26.6 - 33.0 pg   MCHC 35.0 31.5 - 35.7 g/dL   RDW 13.3 11.6 - 15.4 %   Platelets 194 150 - 450 x10E3/uL   Neutrophils 71 Not Estab. %   Lymphs 20 Not Estab. %   Monocytes 5 Not Estab. %   Eos 2 Not Estab. %   Basos 1 Not Estab. %   Neutrophils Absolute 5.2 1.4 - 7.0 x10E3/uL   Lymphocytes Absolute 1.4 0.7 - 3.1 x10E3/uL   Monocytes Absolute 0.4 0.1 - 0.9 x10E3/uL   EOS (ABSOLUTE) 0.1 0.0 - 0.4 x10E3/uL   Basophils Absolute 0.1 0.0 - 0.2 x10E3/uL   Immature Granulocytes 1 Not Estab. %   Immature Grans (Abs) 0.1 0.0 - 0.1 x10E3/uL  CMP14+EGFR     Status: Abnormal   Collection Time: 05/25/21  2:40 PM  Result Value Ref Range   Glucose 182 (H) 65 - 99 mg/dL  BUN 12 8 - 27 mg/dL   Creatinine, Ser 1.04 0.76 - 1.27 mg/dL   eGFR 79 >59 mL/min/1.73   BUN/Creatinine Ratio 12 10 - 24   Sodium 139 134 - 144 mmol/L   Potassium 4.7 3.5 - 5.2 mmol/L   Chloride 100 96 - 106 mmol/L   CO2 22 20 - 29 mmol/L   Calcium 9.4 8.6 - 10.2 mg/dL   Total Protein 6.8 6.0 - 8.5 g/dL   Albumin 4.4 3.8 - 4.8 g/dL   Globulin, Total 2.4 1.5 - 4.5 g/dL   Albumin/Globulin Ratio 1.8 1.2 - 2.2   Bilirubin Total 0.3 0.0 - 1.2 mg/dL   Alkaline Phosphatase 93 44 - 121 IU/L   AST 26 0 - 40 IU/L   ALT 45 (H) 0 - 44 IU/L  HgB A1c     Status: Abnormal   Collection Time: 05/25/21  2:40 PM  Result Value Ref Range   Hgb A1c MFr Bld 6.8 (H) 4.8 - 5.6 %    Comment:          Prediabetes: 5.7 - 6.4          Diabetes: >6.4           Glycemic control for adults with diabetes: <7.0    Est. average glucose Bld gHb Est-mCnc 148 mg/dL  TSH     Status: None   Collection Time: 05/25/21  2:40 PM  Result Value Ref Range   TSH 1.240 0.450 - 4.500 uIU/mL  Lipid Profile     Status: Abnormal   Collection Time: 05/25/21  2:40 PM  Result Value Ref Range   Cholesterol, Total 104 100 - 199 mg/dL   Triglycerides 184 (H) 0 - 149 mg/dL   HDL 45 >39 mg/dL   VLDL Cholesterol Cal 30 5 - 40 mg/dL   LDL Chol Calc (NIH) 29 0 - 99 mg/dL   Chol/HDL Ratio 2.3 0.0 - 5.0 ratio    Comment:                                   T. Chol/HDL Ratio                                             Men  Women                               1/2 Avg.Risk  3.4    3.3                                   Avg.Risk  5.0    4.4                                2X Avg.Risk  9.6    7.1                                3X Avg.Risk 23.4   11.0       Psychiatric Specialty Exam: Physical Exam  Review of Systems  There were no vitals taken for this visit.There  is no height or weight on file to calculate BMI.  General Appearance: NA  Eye Contact:  NA  Speech:  Slow  Volume:  Decreased  Mood:  Anxious and Dysphoric  Affect:  NA  Thought Process:  Descriptions of Associations: Intact  Orientation:  Full (Time, Place, and Person)  Thought Content:  Paranoid Ideation  Suicidal Thoughts:  No  Homicidal Thoughts:  No  Memory:  Immediate;   Fair Recent;   Fair Remote;   Fair  Judgement:  Fair  Insight:  Shallow  Psychomotor Activity:  NA  Concentration:  Concentration: Fair and Attention Span: Fair  Recall:  [employer] of Knowledge:  Fair  Language:  Fair  Akathisia:  No  Handed:  Right  AIMS (if indicated):     Assets:  Communication Skills Desire for Improvement Housing  ADL's:  Intact  Cognition:  Impaired,  Mild  Sleep:   ok      Assessment and Plan: Schizoaffective disorder, bipolar type.  Generalized anxiety disorder.  Primary insomnia.  I reviewed  blood work results.  His hemoglobin A1c is high at 6.8.  I encourage to contact his PCP to address high blood sugar.  Patient does not want to change the medication however he is wondering about ketamine to help his depression.  I explained about the pros and cons of ketamine treatment and he decided to stay on his current medication.  I will send my note to his PCP.  Continue Seroquel 400 mg at bedtime, Belsomra 10 mg at bedtime and amitriptyline 200 mg at bedtime.  Recommended to call us back if is any question or any concern.  Follow-up in 3 months.  Follow Up Instructions:    I discussed the assessment and treatment plan with the patient. The patient was provided an opportunity to ask questions and all were answered. The patient agreed with the plan and demonstrated an understanding of the instructions.   The patient was advised to call back or seek an in-person evaluation if the symptoms worsen or if the condition fails to improve as anticipated.  I provided 18 minutes of non-face-to-face time during this encounter.   Kathlee Nations, MD

## 2021-06-16 ENCOUNTER — Other Ambulatory Visit: Payer: Self-pay

## 2021-06-20 ENCOUNTER — Ambulatory Visit: Payer: Medicare Other | Admitting: Family Medicine

## 2021-06-26 ENCOUNTER — Other Ambulatory Visit: Payer: Self-pay

## 2021-06-26 ENCOUNTER — Ambulatory Visit: Payer: Medicare Other | Attending: Audiologist | Admitting: Audiologist

## 2021-06-26 DIAGNOSIS — H903 Sensorineural hearing loss, bilateral: Secondary | ICD-10-CM | POA: Insufficient documentation

## 2021-06-26 NOTE — Procedures (Signed)
  Outpatient Audiology and [facility], [place]  [address] [phone]  AUDIOLOGICAL  EVALUATION  NAME: LEONCE BALE     DOB:   July 01, 1956      MRN: [id number]                                                                                     DATE: 06/26/2021     REFERENT: Leeanne Rio, MD STATUS: Outpatient DIAGNOSIS: Sensorineural Hearing Loss Bilateral    History: [name] was seen for an audiological evaluation.  [name] is receiving a hearing evaluation due to concerns for decreased hearing, his partner has been saying he is not hearing well . [name] denies difficulty hearing in background noise, crowds, and when people are at a distance.  No pain or pressure reported in either ear. No tinnitus present in either ear. [name] has no history of noise exposure. [name] said he doctor told him he has excessive ear wax. He also has very dry skin in his ear canals which bother him.  Medical history positive for TIA which is a risk factor for hearing loss. No other relevant case history reported.   Evaluation:  Otoscopy showed a no view of the tympanic membranes due to cerumen build up, bilaterally Tympanometry results were consistent with normal middle ear function showing cerumen is not occluding the ears bilaterally   Audiometric testing was completed using conventional audiometry with supraural high frequency transducer. Speech Recognition Thresholds were consistent with pure tone averages. Word Recognition was good an elevated level. Pure tone thresholds show normal sloping to a mild sensorineural hearing loss in both ears. Test results are consistent with presbycusis.   Results:  The test results were reviewed with Wille Glaser. He has hearing loss in both ears in the high frequencies. He is missing high pitch consonants such as /sh/ /t/ and /s/. He needs to try and communicate face to face with people. He was given a list of communication tips for people with mild  hearing loss. He was advised to use Debrox to manage wax build up.   Recommendations: Annual audiometric evaluation recommended to monitor hearing loss progression due to history of TIA.  Debrox Earwax Removal Drops are a safe and inexpensive in-home solution for wax removal. Debrox Earwax Removal Kit includes a soft rubber bulb syringe to rinse your ear after using Debrox Earwax Removal Drops. Excessive earwax build-up can lead to ear discomfort and reduced hearing, which can affect your day-to-day life. The kit can be purchased over the counter at [place], [place], [facility], and most other pharmacies.   How to use the Debrox Earwax Removal Drops Kit:  tilt head sideways. place 5 to 10 drops into ear. tip of applicator should not enter ear canal. keep drops in ear for several minutes by keeping head tilted or placing cotton in the ear. use twice daily for up to four days  gently flush ear with water, using soft rubber bulb syringe after final treatment (on 4th day)    Laurence Ferrari, Au.D., CCC-A 06/26/2021  1:05 PM  Cc: Leeanne Rio, MD

## 2021-06-27 ENCOUNTER — Encounter: Payer: Self-pay | Admitting: Family Medicine

## 2021-06-27 ENCOUNTER — Ambulatory Visit (INDEPENDENT_AMBULATORY_CARE_PROVIDER_SITE_OTHER): Payer: Medicare Other

## 2021-06-27 ENCOUNTER — Ambulatory Visit (HOSPITAL_COMMUNITY)
Admission: RE | Admit: 2021-06-27 | Discharge: 2021-06-27 | Disposition: A | Discharge status: Home / Self Care | Payer: Medicare Other | Source: Ambulatory Visit | Attending: Family Medicine | Admitting: Family Medicine

## 2021-06-27 ENCOUNTER — Ambulatory Visit (INDEPENDENT_AMBULATORY_CARE_PROVIDER_SITE_OTHER): Payer: Medicare Other | Admitting: Family Medicine

## 2021-06-27 VITALS — BP 110/70 | HR 85 | Ht 70.0 in | Wt 212.1 lb

## 2021-06-27 DIAGNOSIS — E1165 Type 2 diabetes mellitus with hyperglycemia: Secondary | ICD-10-CM | POA: Insufficient documentation

## 2021-06-27 DIAGNOSIS — Z23 Encounter for immunization: Secondary | ICD-10-CM | POA: Diagnosis not present

## 2021-06-27 DIAGNOSIS — R059 Cough, unspecified: Secondary | ICD-10-CM | POA: Insufficient documentation

## 2021-06-27 DIAGNOSIS — E119 Type 2 diabetes mellitus without complications: Secondary | ICD-10-CM | POA: Insufficient documentation

## 2021-06-27 DIAGNOSIS — R739 Hyperglycemia, unspecified: Secondary | ICD-10-CM | POA: Diagnosis not present

## 2021-06-27 DIAGNOSIS — C44629 Squamous cell carcinoma of skin of left upper limb, including shoulder: Secondary | ICD-10-CM | POA: Diagnosis not present

## 2021-06-27 LAB — GLUCOSE, POCT (MANUAL RESULT ENTRY): POC Glucose: 206 mg/dl — AB (ref 70–99)

## 2021-06-27 MED ORDER — METFORMIN HCL ER 500 MG PO TB24: 500.0000 mg | ORAL_TABLET | Freq: Every day | ORAL | 1 refills | Status: DC

## 2021-06-27 NOTE — Progress Notes (Signed)
    SUBJECTIVE:   CHIEF COMPLAINT / HPI:   Diabetes He has type 2 diabetes mellitus. Pertinent negatives for hypoglycemia include no dizziness or speech difficulty. Associated symptoms include fatigue and polyphagia. Pertinent negatives for diabetes include no blurred vision, no chest pain, no foot paresthesias, no foot ulcerations, no polydipsia, no visual change and no weight loss. (Increase urine frequency. He feels like he might be drinking too much of water and sometimes thirsty) Symptoms are stable. When asked about current treatments, none were reported. He rarely participates in exercise.  Strong family hx of DM - a brother and 2 maternal uncles. He was informed by his Psychiatrist about abnormal A1C result requiring PCP follow-up.   Left arm lesion: biopsy positive for skin cancer. Requested removal.  Cough:  C/o cough for about a month. He was seen by the [facility], who prescribed A/B, which he completed about 10 days ago. Cough persists. No SOB or chest pain. Denies myalgia, fever, or fatigue.  PERTINENT  PMH / PSH: PMHx  OBJECTIVE:   Vitals:   06/27/21 1006  BP: 110/70  Pulse: 85  SpO2: 98%  Weight: 212 lb 2 oz (96.2 kg)  Height: 5\' 10"  (1.778 m)    Physical Exam Vitals and nursing note reviewed.  Cardiovascular:     Rate and Rhythm: Normal rate and regular rhythm.     Heart sounds: Normal heart sounds. No murmur heard. Pulmonary:     Effort: Pulmonary effort is normal. No respiratory distress.     Breath sounds: Normal breath sounds. No wheezing.     Comments: + mid to basal lung crackles B/L Abdominal:     General: Bowel sounds are normal. There is no distension.     Palpations: Abdomen is soft. There is no mass.     Tenderness: There is no abdominal tenderness.  Musculoskeletal:     Right lower leg: No edema.     Left lower leg: No edema.  Skin:    Comments: Raised, fleshy color, nodular lesion on the dorsum of his left forearm - see image in media      ASSESSMENT/PLAN:   New onset type 2 diabetes mellitus ([place]) + hyperglycemia with intermittent symptoms. I discussed diet control/lifestyle modification since his A1C is at goal. He is unable to do this. I started him on metformin 500 mg qd. F/U with PCP for reassessment.  All questions were answered.   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 He is aware of the result. Derm appointment scheduled this week for removal.  Cough Subacute. S/P A/B treatment per the patient. Xray ordered for further evaluation. I will contact him soon with the result.     Andrena Mews, MD [place]

## 2021-06-27 NOTE — Assessment & Plan Note (Signed)
>>  ASSESSMENT AND PLAN FOR NEW ONSET TYPE 2 DIABETES MELLITUS ([place]) WRITTEN ON 06/27/2021  3:28 PM BY Kinnie Feil, MD  + hyperglycemia with intermittent symptoms. I discussed diet control/lifestyle modification since his A1C is at goal. He is unable to do this. I started him on metformin 500 mg qd. F/U with PCP for reassessment.  All questions were answered.

## 2021-06-27 NOTE — Assessment & Plan Note (Signed)
Subacute. S/P A/B treatment per the patient. Xray ordered for further evaluation. I will contact him soon with the result.

## 2021-06-27 NOTE — Assessment & Plan Note (Signed)
+   hyperglycemia with intermittent symptoms. I discussed diet control/lifestyle modification since his A1C is at goal. He is unable to do this. I started him on metformin 500 mg qd. F/U with PCP for reassessment.  All questions were answered.

## 2021-06-27 NOTE — Patient Instructions (Signed)
It was nice meeting you, [name]. You now have a diagnosis of diabetes mellitus, likely type 2. We discussed diet control vs. starting a medication given your glucose level > 200 with symptoms.  You opted for medication management. Hence, I have prescribed Metformin to your pharmacy. Please, come in soon to f/u with your PCP regarding medication tolerance. We will see you in the dermatology clinic for arm lesion removal.  Please, go to the [facility] radiology department for a chest x-ray as discussed.

## 2021-06-27 NOTE — Assessment & Plan Note (Signed)
Biopsy report reviewed. He is aware of the result. Derm appointment scheduled this week for removal.

## 2021-06-28 ENCOUNTER — Telehealth: Payer: Self-pay | Admitting: Family Medicine

## 2021-06-28 MED ORDER — AZITHROMYCIN 250 MG PO TABS: ORAL_TABLET | ORAL | 0 refills | Status: DC

## 2021-06-28 NOTE — Telephone Encounter (Signed)
I called his pharmacy, and they confirmed that he was placed on a five days course of Doxycycline the last week in June. I called and discussed the xray report with him. Since he is still symptomatic, I will give him a Zithromax course of treatment. F/U with PCP discussed. He agreed with the plan.

## 2021-06-29 ENCOUNTER — Other Ambulatory Visit: Payer: Self-pay

## 2021-06-29 ENCOUNTER — Ambulatory Visit (INDEPENDENT_AMBULATORY_CARE_PROVIDER_SITE_OTHER): Payer: Medicare Other | Admitting: Family Medicine

## 2021-06-29 VITALS — BP 105/75 | HR 82 | Ht 71.0 in | Wt 212.6 lb

## 2021-06-29 DIAGNOSIS — D0462 Carcinoma in situ of skin of left upper limb, including shoulder: Secondary | ICD-10-CM | POA: Diagnosis not present

## 2021-06-29 DIAGNOSIS — L608 Other nail disorders: Secondary | ICD-10-CM | POA: Diagnosis not present

## 2021-06-29 DIAGNOSIS — F32A Depression, unspecified: Secondary | ICD-10-CM | POA: Diagnosis not present

## 2021-06-29 DIAGNOSIS — C44629 Squamous cell carcinoma of skin of left upper limb, including shoulder: Secondary | ICD-10-CM | POA: Diagnosis not present

## 2021-06-29 DIAGNOSIS — R052 Subacute cough: Secondary | ICD-10-CM | POA: Diagnosis not present

## 2021-06-29 NOTE — Progress Notes (Addendum)
   SUBJECTIVE:   CHIEF COMPLAINT / HPI:   Chief Complaint  Patient presents with   lesion on left forearm     [name] is a 65 y.o. male here for follow up of left forearm lesion. Pt had pathology report squamous cell carcinoma in situ. Area has not changed in size. Denies redness or pain.    PERTINENT  PMH / PSH: reviewed and updated as appropriate   OBJECTIVE:   BP 105/75   Pulse 82   Ht 5\' 11"  (1.803 m)   Wt 212 lb 9.6 oz (96.4 kg)   SpO2 97%   BMI 29.65 kg/m    GEN: well appearing male in no acute distress  CVS: well perfused  RESP: speaking in full sentences without pause, no respiratory distress  SKIN: 1 cm x 0.5 cm erythematous lesion, see previous imaging        Excisional Biopsy Procedure Note  Locations: left forearm  Indications: concern for squamous cell carcinoma in situ  Anesthesia: Lidocaine 1% with epinephrine   Procedure Details  The risks, benefits, indications, potential complications, and alternatives were explained to the patient and informed written consent obtained.  The lesion and surrounding area was given a sterile prep using betadyne and draped in the usual sterile fashion. A scalpel was used to excise an rhomboidal area of skin approximately 1.5 cm by 2 cm. The wound was closed with 3-0 Nylon using simple interrupted stitches. Antibiotic ointment and a sterile dressing applied. The specimen was sent for pathologic examination. The patient tolerated the procedure well.  EBL: minimal  Condition: Stable  Complications:  None     ASSESSMENT/PLAN:   Squamous cell cancer of skin of left forearm - Instructed to keep the wound dry and covered for 24-48 hours and clean thereafter. - Warning signs of infection were reviewed.   - Recommended that the patient use OTC analgesics as needed for pain.  -  Return for suture removal in 14 days.   Melanonychia Denies hx of trauma. Possibly nailbed hematoma, drug related or melanoma or  autoimmune disorder. Recommended monitoring by PCP  Lyndee Hensen, DO PGY-2, [facility] 06/29/2021

## 2021-06-29 NOTE — Patient Instructions (Addendum)
It was great seeing you today!   I'd like to see you back on 07/13/21 at 3 pm for suture removal.   If you have questions or concerns please do not hesitate to call at [phone].  Dr. Rushie [facility]

## 2021-06-30 ENCOUNTER — Other Ambulatory Visit: Payer: Self-pay | Admitting: *Deleted

## 2021-06-30 DIAGNOSIS — I10 Essential (primary) hypertension: Secondary | ICD-10-CM

## 2021-06-30 NOTE — Assessment & Plan Note (Signed)
-   Instructed to keep the wound dry and covered for 24-48 hours and clean thereafter. - Warning signs of infection were reviewed.   - Recommended that the patient use OTC analgesics as needed for pain.  -  Return for suture removal in 14 days.

## 2021-06-30 NOTE — Progress Notes (Signed)
I will cosign the resident's note once it is completed.  Briefly, the patient presented for removal of his left forearm SCC. He inquire about his cough and xray report. No new concerns. On exam: there is a 1 cm by 0.5 cm nodular lesion on his left forearm, as well as a black pigmentation of his left thumbnail (he denies trauma but did mention that the black portion is growing out as it initially started from his nail base).   PHQ9 with positive question #9. When I inquired about this, he said this is chronic, and has no intention to hurt himself. He is compliant with his Psych meds as well as Psych follow-up. He denies SI at the time of his visit. ED/Psych precaution discussed.  Left thumbnail hyperpigmentation: ?? Traumatic (Subungal hematoma) vs Melanoma I advised him to f/u with his PCP soon for reassessment and possible referral to dermatologist. He agreed with the plan.  Cough: I reminded him of the telephone conversation I had with him. He is yet to pick up his A/B. Plan to start A/B as instructed and f/u with PCP for reassessment. He agreed with the plan.  Dr. Susa Simmonds completed the excision of his SCC lesion under my supervision. The specimen was sent to pathology.

## 2021-07-03 MED ORDER — METOPROLOL SUCCINATE ER 100 MG PO TB24: 200.0000 mg | ORAL_TABLET | Freq: Every day | ORAL | 0 refills | Status: DC

## 2021-07-13 ENCOUNTER — Ambulatory Visit (INDEPENDENT_AMBULATORY_CARE_PROVIDER_SITE_OTHER): Payer: Medicare Other | Admitting: Student

## 2021-07-13 ENCOUNTER — Ambulatory Visit: Payer: Medicare Other

## 2021-07-13 ENCOUNTER — Other Ambulatory Visit: Payer: Self-pay | Admitting: Family Medicine

## 2021-07-13 ENCOUNTER — Other Ambulatory Visit: Payer: Self-pay

## 2021-07-13 DIAGNOSIS — C44629 Squamous cell carcinoma of skin of left upper limb, including shoulder: Secondary | ICD-10-CM

## 2021-07-13 NOTE — Progress Notes (Signed)
    SUBJECTIVE:   CHIEF COMPLAINT / HPI:  [name] is a 65 year old male here for removal of sutures that were placed on 06/29/2021  following excision of squamous cell cancer of skin on forearm, with plan for removal in 14 days, which is today.  PERTINENT  PMH / PSH: Squamous cell cancer of skin of left forearm  OBJECTIVE:  GEN: Well-appearing male in no acute distress CVS: Well-perfused RESP: Speaking in full sentences without pause, no respiratory distress SKIN: 3 cm x 1 cm wound with 5 simple interrupted stitches in place.  Granulation tissue with surrounding erythema.  No purulent drainage.  No tenderness with palpation. ASSESSMENT/PLAN:   I obtained consent, performed hand hygiene, wore nonsterile gloves and removed the sutures using sterile scissors and forceps.  All 5 simple interrupted stitches were removed and wound was inspected afterward.  There was no bleeding or tenderness with removal.  I gave instructions and return precautions including to observe wound for signs of infection including erythema, warmth, swelling, purulent drainage.  He was educated to keep the wound clean and dry at home.  Orvis Brill, [place]

## 2021-07-14 ENCOUNTER — Emergency Department (HOSPITAL_BASED_OUTPATIENT_CLINIC_OR_DEPARTMENT_OTHER)
Admission: EM | Admit: 2021-07-14 | Discharge: 2021-07-14 | Disposition: A | Discharge status: Home / Self Care | Payer: Medicare Other | Attending: Emergency Medicine | Admitting: Emergency Medicine

## 2021-07-14 ENCOUNTER — Encounter (HOSPITAL_BASED_OUTPATIENT_CLINIC_OR_DEPARTMENT_OTHER): Payer: Self-pay | Admitting: Emergency Medicine

## 2021-07-14 DIAGNOSIS — S51802D Unspecified open wound of left forearm, subsequent encounter: Secondary | ICD-10-CM | POA: Insufficient documentation

## 2021-07-14 DIAGNOSIS — L03114 Cellulitis of left upper limb: Secondary | ICD-10-CM | POA: Diagnosis not present

## 2021-07-14 DIAGNOSIS — S59912D Unspecified injury of left forearm, subsequent encounter: Secondary | ICD-10-CM | POA: Diagnosis present

## 2021-07-14 DIAGNOSIS — Z96612 Presence of left artificial shoulder joint: Secondary | ICD-10-CM | POA: Insufficient documentation

## 2021-07-14 DIAGNOSIS — Z85828 Personal history of other malignant neoplasm of skin: Secondary | ICD-10-CM | POA: Insufficient documentation

## 2021-07-14 DIAGNOSIS — L0390 Cellulitis, unspecified: Secondary | ICD-10-CM

## 2021-07-14 DIAGNOSIS — N3000 Acute cystitis without hematuria: Secondary | ICD-10-CM | POA: Diagnosis not present

## 2021-07-14 DIAGNOSIS — X58XXXD Exposure to other specified factors, subsequent encounter: Secondary | ICD-10-CM | POA: Insufficient documentation

## 2021-07-14 MED ORDER — CEPHALEXIN 500 MG PO CAPS: 500.0000 mg | ORAL_CAPSULE | Freq: Four times a day (QID) | ORAL | 0 refills | Status: DC

## 2021-07-14 NOTE — ED Provider Notes (Signed)
[facility] EMERGENCY DEPT Provider Note   CSN: [id number] Arrival date & time: 07/14/21  1409     History Chief Complaint  Patient presents with   Wound Check    [name] is a 65 y.o. male.  HPI  65 year old male with past medical history of HTN, HLD, seizure disorder, memory issues presents emergency department with left upper extremity wound.  Patient recently had a skin excision for skin cancer on the left forearm.  The stitches were removed yesterday.  Since then he noticed an opening at the center of the wound with some surrounding redness.  Denies any fever, chills, systemic symptoms.  No other swelling of the left upper extremity.  Past Medical History:  Diagnosis Date   Anxiety    Chronic insomnia 04/14/2020   Constipation    Depression    Dyslipidemia    GERD (gastroesophageal reflux disease)    Hearing loss    History of echocardiogram    Echo 8/18: EF 50-55, normal wall motion, grade 1 diastolic dysfunction, mild MR   Hypertension    Memory difficulty 10/25/2017   Memory loss    MRSA carrier    Schizoaffective disorder    Seizures ([place])     Patient Active Problem List   Diagnosis Date Noted   New onset type 2 diabetes mellitus ([place]) 06/27/2021   Squamous cell cancer of skin of left forearm 06/27/2021   Cough 06/27/2021   Ear itching 06/01/2021   Chronic insomnia 04/14/2020   Prediabetes A999333   Acute metabolic encephalopathy 123456   Encephalopathy acute 01/24/2019   Closed tibia fracture 11/25/2018   Fracture of tibial shaft, left, closed 11/25/2018   Unintentional weight loss 04/07/2018   Syncope and collapse 12/13/2017   Memory disorder 10/25/2017   Presbyacusia 07/26/2017   Palpitations 07/01/2017   Acute bronchiolitis due to human metapneumovirus    Mild sleep apnea 06/15/2016   Actinic keratosis 06/15/2016   Nocturnal dyspnea 02/03/2016   Skin lesion of face 02/03/2016   Paresthesia of both hands 09/16/2015    Phenytoin toxicity    TIA (transient ischemic attack) 07/29/2015   Vertigo 07/29/2015   Schizoaffective disorder, depressive type with good prognostic features ([place]) 07/13/2015   [place] 05/18/2015   Seborrheic dermatitis of scalp 05/18/2015   MDD (major depressive disorder), recurrent severe, without psychosis (HCC)    OCD (obsessive compulsive disorder)    Seizures ([place])    Schizoaffective disorder, depressive type ([place])    Orthostatic hypotension    Fall    Seizure ([place]) 11/18/2014   Schizoaffective disorder, unspecified type ([place])    Essential hypertension    Overdose of beta-adrenergic antagonist drug 10/22/2014   Overdose    Nocturnal enuresis 03/23/2014   Benzodiazepine dependence, continuous ([place]) 01/27/2014   Severe major depression ([place]) 01/21/2014   Frozen shoulder syndrome 09/20/2013   Somnolence 09/20/2013   Schizoaffective disorder ([place]) 09/20/2013   Presbyopia 04/25/2012   Androgen deficiency 09/07/2011   Constipation, chronic 09/07/2011   Other specified disorders of liver 12/30/2008   Overweight(278.02) 09/08/2008   Hyperlipidemia 01/28/2008   Seizure disorder ([place]) 01/28/2008   OSTEOPENIA 07/01/2007    Past Surgical History:  Procedure Laterality Date   CARPAL TUNNEL RELEASE     COLONOSCOPY  2010   ORIF ANKLE FRACTURE Left 11/26/2018   Procedure: OPEN REDUCTION INTERNAL FIXATION (ORIF) ANKLE FRACTURE;  Surgeon: Hiram Gash, MD;  Location: [facility];  Service: Orthopedics;  Laterality: Left;   ORIF SHOULDER FRACTURE  self orchectomy     SHOULDER CLOSED REDUCTION Left 09/16/2013   Procedure: CLOSED REDUCTION SHOULDER;  Surgeon: Mauri Pole, MD;  Location: [facility];  Service: Orthopedics;  Laterality: Left;   SHOULDER HEMI-ARTHROPLASTY Left 09/18/2013   Procedure: LEFT SHOULDER HEMI-ARTHROPLASTY;  Surgeon: Augustin Schooling, MD;  Location: [facility];  Service: Orthopedics;  Laterality: Left;   TIBIA IM NAIL INSERTION Left 11/26/2018   Procedure: INTRAMEDULLARY  (IM) NAIL TIBIAL;  Surgeon: Hiram Gash, MD;  Location: [facility];  Service: Orthopedics;  Laterality: Left;       Family History  Problem Relation Age of Onset   Stroke Father        Living at 2   Stroke Mother        Stroke in late 32's. Died in her 76's   Alcohol abuse Brother    Alcohol abuse Brother    Diabetes Brother    Colon cancer Neg Hx    Esophageal cancer Neg Hx    Stomach cancer Neg Hx    Rectal cancer Neg Hx     Social History   Tobacco Use   Smoking status: Never   Smokeless tobacco: Never  Vaping Use   Vaping Use: Never used  Substance Use Topics   Alcohol use: No    Alcohol/week: 0.0 standard drinks   Drug use: No    Home Medications Prior to Admission medications   Medication Sig Start Date End Date Taking? Authorizing Provider  amitriptyline (ELAVIL) 100 MG tablet Take 2 tablets (200 mg total) by mouth at bedtime. 06/15/21  Yes Arfeen, Arlyce Harman, MD  atorvastatin (LIPITOR) 40 MG tablet TAKE 1 TABLET BY MOUTH EVERY DAY 02/06/21  Yes Leeanne Rio, MD  cephALEXin (KEFLEX) 500 MG capsule Take 1 capsule (500 mg total) by mouth 4 (four) times daily. 07/14/21  Yes Caydee Talkington M, DO  Lacosamide (VIMPAT) 150 MG TABS Take 1 tablet (150 mg total) by mouth 2 (two) times daily. 04/13/21  Yes Lomax, Amy, NP  losartan (COZAAR) 25 MG tablet TAKE 1 TABLET BY MOUTH EVERY DAY 07/13/21  Yes Leeanne Rio, MD  metFORMIN (GLUCOPHAGE-XR) 500 MG 24 hr tablet Take 1 tablet (500 mg total) by mouth daily with breakfast. 06/27/21  Yes Kinnie Feil, MD  metoprolol succinate (TOPROL-XL) 100 MG 24 hr tablet Take 2 tablets (200 mg total) by mouth at bedtime. Take with or immediately following a meal. 07/03/21  Yes Leeanne Rio, MD  Multiple Vitamin (MULTIVITAMIN WITH MINERALS) TABS tablet Take 1 tablet by mouth daily.   Yes [provider]  QUEtiapine (SEROQUEL) 400 MG tablet Take 1 tablet (400 mg total) by mouth at bedtime. 06/15/21  Yes Arfeen, Arlyce Harman, MD   Suvorexant (BELSOMRA) 10 MG TABS Take 10 mg by mouth at bedtime. 06/15/21  Yes Arfeen, Arlyce Harman, MD  testosterone cypionate (DEPOTESTOSTERONE CYPIONATE) 200 MG/ML injection SMARTSIG:1 Milliliter(s) IM Every 2 Weeks 10/01/19  Yes [provider]  acetaminophen (TYLENOL) 500 MG tablet Take 1,000 mg by mouth every 6 (six) hours as needed for mild pain or moderate pain. Patient not taking: Reported on 06/27/2021    [provider]  azithromycin (ZITHROMAX) 250 MG tablet Take 2 tabs day 1, then 1 tab daily 06/28/21   Andrena Mews T, MD  polyethylene glycol powder ([employer] PURELAX) powder TAKE 17 GRAMS 2 TIMES A DAY AS NEEDED FOR CONSTIPATION. MIX INTO 8 OF FLUID AND DRINK Patient taking differently: Take 17 g by mouth daily.  Mix into 8 oz of fluid and drink 09/18/18   Dickie La, MD    Allergies    Codeine, Sulfa antibiotics, and Sulfasalazine  Review of Systems   Review of Systems  Constitutional:  Negative for chills and fever.  Respiratory:  Negative for shortness of breath.   Cardiovascular:  Negative for chest pain.  Gastrointestinal:  Negative for abdominal pain.  Musculoskeletal:        Left forearm wound  Skin:  Positive for wound.  Neurological:  Negative for headaches.   Physical Exam Updated Vital Signs BP (!) 137/92 (BP Location: Right Arm)   Pulse 67   Temp 98.4 F (36.9 C) (Oral)   Resp 16   Ht '5\' 11"'$  (1.803 m)   Wt 95.3 kg   SpO2 96%   BMI 29.29 kg/m   Physical Exam Vitals and nursing note reviewed.  Constitutional:      Appearance: Normal appearance.  HENT:     Head: Normocephalic.     Mouth/Throat:     Mouth: Mucous membranes are moist.  Cardiovascular:     Rate and Rhythm: Normal rate.  Pulmonary:     Effort: Pulmonary effort is normal. No respiratory distress.  Musculoskeletal:     Comments: Approximate 4 cm linear scar with small area of dehiscence in the middle with no pustular drainage, small amount of redness around the outer border of  the wound consistent with cellulitis, no findings of abscess, left upper extremity is otherwise neurovascularly intact  Skin:    General: Skin is warm.  Neurological:     Mental Status: He is alert and oriented to person, place, and time. Mental status is at baseline.  Psychiatric:        Mood and Affect: Mood normal.    ED Results / Procedures / Treatments   Labs (all labs ordered are listed, but only abnormal results are displayed) Labs Reviewed - No data to display  EKG None  Radiology No results found.  Procedures Procedures   Medications Ordered in ED Medications - No data to display  ED Course  I have reviewed the triage vital signs and the nursing notes.  Pertinent labs & imaging results that were available during my care of the patient were reviewed by me and considered in my medical decision making (see chart for details).    MDM Rules/Calculators/A&P                           65 year old male presents to the emergency department with ongoing wound of the left forearm.  He had a previous skin excision with recently removed sutures, there is been a small area of dehiscence with mild redness surrounding the border of the wound.  Physical exam is consistent with mild cellulitis.  No pustular drainage or sign of abscess.  No fever or signs of sepsis.  Plan for wound dressing and oral antibiotics with outpatient follow-up.  Patient at this time appears safe and stable for discharge and will be treated as an outpatient.  Discharge plan and strict return to ED precautions discussed, patient verbalizes understanding and agreement.  Final Clinical Impression(s) / ED Diagnoses Final diagnoses:  Cellulitis, unspecified cellulitis site    Rx / DC Orders ED Discharge Orders          Ordered    cephALEXin (KEFLEX) 500 MG capsule  4 times daily        07/14/21 1637  Lorelle Gibbs, DO 07/14/21 1706

## 2021-07-14 NOTE — Discharge Instructions (Addendum)
You have been seen and discharged from the emergency department.  Keep the wound covered for the next 24 hours, then you may let the wound heal to open air.  Take antibiotics as directed.  Follow-up with your primary provider for reevaluation and further care. Take home medications as prescribed. If you have any worsening symptoms, worsening swelling/redness, high fevers or further concerns for your health please return to an emergency department for further evaluation.

## 2021-07-14 NOTE — ED Notes (Signed)
Bacitracin and dry dressing applied per MD. Pt dc home with belongings. Pt states understanding of dc instructions and wound care.

## 2021-07-14 NOTE — ED Triage Notes (Addendum)
Pt presents to ED POV. Pt c/o wound to LUE. Wound is open, bleeding controlled, reddened around. Pt reports that he had stiches removed yesterday from skin cancer removal wound. Reports no fever, chills at home.

## 2021-07-16 ENCOUNTER — Telehealth: Payer: Self-pay | Admitting: Family Medicine

## 2021-07-16 NOTE — Telephone Encounter (Signed)
**   AFTER HOURS EMERGENCY LINE CALL**  Patient placed call regarding dehiscence of left arm wound s/p skin lesion resection. Has resection and sutures placed 7/14; sutures removed in clinic 7/28. Was seen in Ed 7/29 for frank wound dehiscence. Prescribed keflex x5 days. Calls to ask about wound care.   Previously scheduled for PCP appointment Tuesday 8/2 at 11:10am. Will adjust appointment notes to have PCP address wound.   Ezequiel Essex, MD PGY-2, [facility] pager [phone]

## 2021-07-18 ENCOUNTER — Ambulatory Visit: Payer: Medicare Other | Admitting: Family Medicine

## 2021-07-19 ENCOUNTER — Other Ambulatory Visit: Payer: Self-pay | Admitting: Family Medicine

## 2021-07-21 ENCOUNTER — Ambulatory Visit (INDEPENDENT_AMBULATORY_CARE_PROVIDER_SITE_OTHER): Payer: Medicare Other | Admitting: Family Medicine

## 2021-07-21 ENCOUNTER — Other Ambulatory Visit: Payer: Self-pay

## 2021-07-21 DIAGNOSIS — R059 Cough, unspecified: Secondary | ICD-10-CM | POA: Diagnosis not present

## 2021-07-21 DIAGNOSIS — F259 Schizoaffective disorder, unspecified: Secondary | ICD-10-CM | POA: Diagnosis not present

## 2021-07-21 DIAGNOSIS — E119 Type 2 diabetes mellitus without complications: Secondary | ICD-10-CM | POA: Diagnosis not present

## 2021-07-21 DIAGNOSIS — E1142 Type 2 diabetes mellitus with diabetic polyneuropathy: Secondary | ICD-10-CM

## 2021-07-21 DIAGNOSIS — L299 Pruritus, unspecified: Secondary | ICD-10-CM

## 2021-07-21 LAB — POCT GLYCOSYLATED HEMOGLOBIN (HGB A1C): HbA1c, POC (controlled diabetic range): 6.1 % (ref 0.0–7.0)

## 2021-07-21 MED ORDER — CEPHALEXIN 500 MG PO CAPS: 500.0000 mg | ORAL_CAPSULE | Freq: Four times a day (QID) | ORAL | 0 refills | Status: DC

## 2021-07-21 NOTE — Patient Instructions (Addendum)
It was great to see you again today!  Refilled antibiotic for 5 more days Take shingles prescription to your pharmacy to get the vaccine Schedule your eye exam Call GI office at [phone] to schedule your follow up colonoscopy  Follow up with me in 3 months, sooner if needed  Be well, Dr. Ardelia Mems

## 2021-07-21 NOTE — Progress Notes (Signed)
Date of Visit: 07/21/2021   SUBJECTIVE:   HPI:  [name] presents today for routine follow up.  Diabetes - recently dx'd with diabetes with A1c of 6.8 on June 9th. Was started on metformin XR '500mg'$  daily, which he is tolerating well.   Itchy ear canals - has had chronic itching of bilateral ear canals. Wants me to look in them today.  Cough - present for about 4 months with congestion. Was treated with antibiotics for possible pneumonia. Overall cough is improving, just still is around. No recent fevers.  Arm wound - had skin biopsy sutures removed with subsequent dehiscence of wound and some infection. Seen in ED and rx for keflex for 5 days was given. He is on his last day of keflex and wonders if antibiotics should be extended further.  OBJECTIVE:  Vitals taken at time of visit but not entered by clinic staff. Vitals were unremarkable.   Gen: no acute distress, pleasant cooperative HEENT: normocephalic, atraumatic. Bilateral external ear canals appear mildly irritated with dry skin. Heart: regular rate and rhythm, no murmur Lungs: clear to auscultation bilaterally, normal work of breathing  Neuro: alert, speech normal, grossly nonfocal Ext: skin biopsy site on arm with continued mild gaping of incision, but overall appears on path to healing. Mild surrounding erythema with some adherent material within wound. Diabetic Foot Exam - Simple   Simple Foot Form Diabetic Foot exam was performed with the following findings: Yes 07/21/2021 12:00 PM  Visual Inspection No deformities, no ulcerations, no other skin breakdown bilaterally: Yes Sensation Testing See comments: Yes Pulse Check Posterior Tibialis and Dorsalis pulse intact bilaterally: Yes Comments Diminished sensation on bilateral plantar feet with monofilament testing     ASSESSMENT/PLAN:   Health maintenance:  -foot exam done today shows diminished sensation of bilateral plantar feet with monofilament testing. Advised  checking feet daily for wounds. -advised to schedule diabetic eye exam -rx given for shingrix -gave phone # so he can call & schedule his follow up colonoscopy as it is due  Wound infection Overall wound appears on its way to healing but would benefit from extension of antibiotics. Sent in another 5 days of keflex for patient.   Schizoaffective disorder ([place]) Follows regularly with psychiatry. Inquired with patient today about SI as he had a positive suicide screen on his PHQ-9. He reports this was an error, denies any recent thoughts of self harm or plans to hurt himself. Continue regular follow up with psych.  New onset type 2 diabetes mellitus ([place]) Tolerating metformin well Unclear if "true" dx of diabetes as A1c was elevated only one time and guidelines suggest needing elevations x2.  Repeated a1c today and it was 6.1, which is challenging to interpret in setting of metformin therapy. Spoke with patient and we agreed to treat him as if he does truly have diabetes. Continue metformin. Follow up in 3 months to recheck A1c  Ear itching Dry canals, likely some eczema of ear canals Recommend gently applying some topical over the counter hydrocortisone cream to his external ear canals and seeing if this helps  Cough Overall improving Suspect postviral/post pneumonia cough which will take time to improve Lungs clear today Continue to monitor  Diabetic peripheral neuropathy associated with type 2 diabetes mellitus (HCC) Diminished sensation on bilateral feet with monofilament testing today Advised checking feet daily for cuts, sores, or lesions  At very end of visit as I was handing patient his after visit summary, patient inquired about getting his drivers license back.  Has not drive in over a year. Advised needs separate appointment to discuss this further.  FOLLOW UP: Follow up in 3 mos for diabetes   [name], [place]

## 2021-07-25 DIAGNOSIS — E1142 Type 2 diabetes mellitus with diabetic polyneuropathy: Secondary | ICD-10-CM | POA: Insufficient documentation

## 2021-07-25 NOTE — Assessment & Plan Note (Signed)
Dry canals, likely some eczema of ear canals Recommend gently applying some topical over the counter hydrocortisone cream to his external ear canals and seeing if this helps

## 2021-07-25 NOTE — Assessment & Plan Note (Signed)
Overall improving Suspect postviral/post pneumonia cough which will take time to improve Lungs clear today Continue to monitor

## 2021-07-25 NOTE — Assessment & Plan Note (Signed)
Tolerating metformin well Unclear if "true" dx of diabetes as A1c was elevated only one time and guidelines suggest needing elevations x2.  Repeated a1c today and it was 6.1, which is challenging to interpret in setting of metformin therapy. Spoke with patient and we agreed to treat him as if he does truly have diabetes. Continue metformin. Follow up in 3 months to recheck A1c

## 2021-07-25 NOTE — Assessment & Plan Note (Signed)
Follows regularly with psychiatry. Inquired with patient today about SI as he had a positive suicide screen on his PHQ-9. He reports this was an error, denies any recent thoughts of self harm or plans to hurt himself. Continue regular follow up with psych.

## 2021-07-25 NOTE — Assessment & Plan Note (Signed)
>>  ASSESSMENT AND PLAN FOR NEW ONSET TYPE 2 DIABETES MELLITUS ([place]) WRITTEN ON 07/25/2021 11:44 PM BY MCINTYRE, Delorse Limber, MD  Tolerating metformin well Unclear if "true" dx of diabetes as A1c was elevated only one time and guidelines suggest needing elevations x2.  Repeated a1c today and it was 6.1, which is challenging to interpret in setting of metformin therapy. Spoke with patient and we agreed to treat him as if he does truly have diabetes. Continue metformin. Follow up in 3 months to recheck A1c

## 2021-07-25 NOTE — Assessment & Plan Note (Signed)
Diminished sensation on bilateral feet with monofilament testing today Advised checking feet daily for cuts, sores, or lesions

## 2021-08-11 ENCOUNTER — Encounter: Payer: Self-pay | Admitting: Gastroenterology

## 2021-08-12 ENCOUNTER — Other Ambulatory Visit: Payer: Self-pay | Admitting: Family Medicine

## 2021-09-18 ENCOUNTER — Telehealth (HOSPITAL_BASED_OUTPATIENT_CLINIC_OR_DEPARTMENT_OTHER): Payer: Medicare Other | Admitting: Psychiatry

## 2021-09-18 ENCOUNTER — Encounter (HOSPITAL_COMMUNITY): Payer: Self-pay | Admitting: Psychiatry

## 2021-09-18 ENCOUNTER — Other Ambulatory Visit: Payer: Self-pay

## 2021-09-18 DIAGNOSIS — F5101 Primary insomnia: Secondary | ICD-10-CM | POA: Diagnosis not present

## 2021-09-18 DIAGNOSIS — F411 Generalized anxiety disorder: Secondary | ICD-10-CM | POA: Diagnosis not present

## 2021-09-18 DIAGNOSIS — F250 Schizoaffective disorder, bipolar type: Secondary | ICD-10-CM

## 2021-09-18 MED ORDER — AMITRIPTYLINE HCL 100 MG PO TABS
200.0000 mg | ORAL_TABLET | Freq: Every day | ORAL | 2 refills | Status: DC
Start: 2021-09-18 — End: 2021-12-19

## 2021-09-18 MED ORDER — BELSOMRA 10 MG PO TABS: 10.0000 mg | ORAL_TABLET | Freq: Every day | ORAL | 2 refills | Status: DC

## 2021-09-18 MED ORDER — QUETIAPINE FUMARATE 400 MG PO TABS: 400.0000 mg | ORAL_TABLET | Freq: Every day | ORAL | 2 refills | Status: DC

## 2021-09-18 NOTE — Progress Notes (Signed)
Virtual Visit via Telephone Note  I connected with [name] on 09/18/21 at  2:20 PM EDT by telephone and verified that I am speaking with the correct person using two identifiers.  Location: Patient: Parking Lot Provider: Home office   I discussed the limitations, risks, security and privacy concerns of performing an evaluation and management service by telephone and the availability of in person appointments. I also discussed with the patient that there may be a patient responsible charge related to this service. The patient expressed understanding and agreed to proceed.   History of Present Illness: Patient is evaluated by phone session.  He is taking Belsomra which is helping his sleep and he is getting at least 7 to 8 hours sleep without any issues.  He had a blood work and he had a visit with his PCP.  He is taking metformin and trying to lose weight.  He has chronic paranoia and difficulty trusting people and lately he felt that his brother has put a bug in his computer and he is reluctant to go on the Internet.  He does not leave the house unless it is important or doctor's appointment.  His appetite is okay.  He is trying to lose weight.  Denies any panic attack, crying spells or any feeling of hopelessness or suicidal thoughts.  He feels very nervous and anxious around people but also reported medicine helping and working.  He has no concern from the medication.    Past Psychiatric History:  H/O depression, paranoia, suicidal attempt, delusions and anxiety. H/O at multiple inpatient.  H/O cutting his testicles because of depression. Thoughts of inhaling helium to end life. Last inpatient August 2016.  D/C on Seroquel 900 mg and amitriptyline 400 mg at bedtime. Saw at [facility] and [name]. SSRI caused reactions. Tried nortriptyline, Haldol, Geodon, Seroquel, lithium, Prozac, Wellbutrin, Paxil, Klonopin, Ativan, Lexapro, Zoloft, abilify and Lamictal.  No H/O mania or drug use.  We did  GeneSight testing and results are in the chart.  Only antipsychotic medication that is unfavorable section is Saphris and Navane.  Most of the SSRIs are also unfavorable section.     Recent Results (from the past 2160 hour(s))  Glucose (CBG)     Status: Abnormal   Collection Time: 06/27/21 10:03 AM  Result Value Ref Range   POC Glucose 206 (A) 70 - 99 mg/dl  HgB A1c     Status: None   Collection Time: 07/21/21 12:07 PM  Result Value Ref Range   Hemoglobin A1C     HbA1c POC (<> result, manual entry)     HbA1c, POC (prediabetic range)     HbA1c, POC (controlled diabetic range) 6.1 0.0 - 7.0 %     Psychiatric Specialty Exam: Physical Exam  Review of Systems  Weight 210 lb (95.3 kg).Body mass index is 29.29 kg/m.  General Appearance: NA  Eye Contact:  NA  Speech:  Slow  Volume:  Decreased  Mood:  Anxious  Affect:  NA  Thought Process:  Descriptions of Associations: Intact  Orientation:  Full (Time, Place, and Person)  Thought Content:  Paranoid Ideation and Rumination  Suicidal Thoughts:  No  Homicidal Thoughts:  No  Memory:  Immediate;   Fair Recent;   Fair Remote;   Fair  Judgement:  Fair  Insight:  Shallow  Psychomotor Activity:  NA  Concentration:  Concentration: Fair and Attention Span: Fair  Recall:  [employer] of Knowledge:  Fair  Language:  Kermit Balo  Akathisia:  No  Handed:  Right  AIMS (if indicated):     Assets:  Communication Skills Desire for Improvement Housing  ADL's:  Intact  Cognition:  Impaired,  Mild  Sleep:   better      Assessment and Plan: Schizoaffective disorder, bipolar type.  Generalized anxiety disorder.  Primary insomnia.  His repeated hemoglobin A1c improved from the past.  He is now seeing PCP.  He promised to follow up with him.  He does not want to change the medication since he is doing better on current psychotropic medication.  Continue amitriptyline 200 mg at bedtime, Belsomra 10 mg at bedtime and Seroquel 400 mg at bedtime.   Recommended to call us back with any question of any concern.  Follow-up in 3 months.  Follow Up Instructions:    I discussed the assessment and treatment plan with the patient. The patient was provided an opportunity to ask questions and all were answered. The patient agreed with the plan and demonstrated an understanding of the instructions.   The patient was advised to call back or seek an in-person evaluation if the symptoms worsen or if the condition fails to improve as anticipated.  I provided 18 minutes of non-face-to-face time during this encounter.   Kathlee Nations, MD

## 2021-09-22 ENCOUNTER — Ambulatory Visit (AMBULATORY_SURGERY_CENTER): Payer: Medicare Other | Admitting: *Deleted

## 2021-09-22 ENCOUNTER — Other Ambulatory Visit: Payer: Self-pay | Admitting: Family Medicine

## 2021-09-22 ENCOUNTER — Other Ambulatory Visit: Payer: Self-pay

## 2021-09-22 VITALS — Ht 71.0 in | Wt 212.0 lb

## 2021-09-22 DIAGNOSIS — Z86010 Personal history of colonic polyps: Secondary | ICD-10-CM

## 2021-09-22 MED ORDER — NA SULFATE-K SULFATE-MG SULF 17.5-3.13-1.6 GM/177ML PO SOLN: 1.0000 | ORAL | 0 refills | Status: DC

## 2021-09-22 NOTE — Progress Notes (Signed)
Patient's pre-visit was done today over the phone with the patient due to COVID-19 pandemic. Name,DOB and address verified. Patient denies any allergies to Eggs and Soy. Patient denies any problems with anesthesia/sedation. Patient is not taking any diet pills or blood thinners. No home Oxygen. Packet of Prep instructions mailed to patient including a copy of a consent form-pt is aware. Patient understands to call us back with any questions or concerns. Patient took notes during PV. Patient is aware of our care-partner policy and [id number] safety protocol.   The patient is COVID-19 vaccinated.

## 2021-10-06 ENCOUNTER — Telehealth: Payer: Self-pay | Admitting: Gastroenterology

## 2021-10-06 ENCOUNTER — Ambulatory Visit: Payer: Medicare Other | Admitting: Gastroenterology

## 2021-10-06 ENCOUNTER — Encounter: Payer: Self-pay | Admitting: Gastroenterology

## 2021-10-06 NOTE — Telephone Encounter (Signed)
Has he was checking in for his colonoscopy this afternoon to our endoscopy center, staff asked him where his care partner was.  He said that his wife was out in the car and he went to go get her.  After several minutes of him not returning staff called him and there was no answer then staff called his wife and she was surprised to hear that he was not actually at home.  She was unaware that he had a colonoscopy appointment.  She said he was cognitively declining over several months and was more confused.  She did not know where he was and she did not know he was coming for a colonoscopy appointment today.   We tried calling the patient several times without answer.  The wife called several times without answer as well.   I personally spoke with the wife on the phone and we discussed that he is missing.  I recommended that we alert the authorities and she agreed.  [place] office staff who spoke with him while he was checking in is doing that now, via 911.

## 2021-10-06 NOTE — Telephone Encounter (Signed)
ok 

## 2021-10-06 NOTE — Telephone Encounter (Signed)
agree

## 2021-10-10 ENCOUNTER — Other Ambulatory Visit: Payer: Self-pay | Admitting: Family Medicine

## 2021-10-10 DIAGNOSIS — I10 Essential (primary) hypertension: Secondary | ICD-10-CM

## 2021-10-26 ENCOUNTER — Emergency Department (HOSPITAL_BASED_OUTPATIENT_CLINIC_OR_DEPARTMENT_OTHER): Payer: Medicare Other

## 2021-10-26 ENCOUNTER — Other Ambulatory Visit: Payer: Self-pay

## 2021-10-26 ENCOUNTER — Encounter (HOSPITAL_BASED_OUTPATIENT_CLINIC_OR_DEPARTMENT_OTHER): Payer: Self-pay | Admitting: Obstetrics and Gynecology

## 2021-10-26 DIAGNOSIS — N390 Urinary tract infection, site not specified: Secondary | ICD-10-CM | POA: Diagnosis not present

## 2021-10-26 DIAGNOSIS — R1084 Generalized abdominal pain: Secondary | ICD-10-CM | POA: Diagnosis not present

## 2021-10-26 DIAGNOSIS — Z7984 Long term (current) use of oral hypoglycemic drugs: Secondary | ICD-10-CM | POA: Insufficient documentation

## 2021-10-26 DIAGNOSIS — Z7982 Long term (current) use of aspirin: Secondary | ICD-10-CM | POA: Insufficient documentation

## 2021-10-26 DIAGNOSIS — E1140 Type 2 diabetes mellitus with diabetic neuropathy, unspecified: Secondary | ICD-10-CM | POA: Insufficient documentation

## 2021-10-26 DIAGNOSIS — I10 Essential (primary) hypertension: Secondary | ICD-10-CM | POA: Diagnosis not present

## 2021-10-26 DIAGNOSIS — Z79899 Other long term (current) drug therapy: Secondary | ICD-10-CM | POA: Diagnosis not present

## 2021-10-26 DIAGNOSIS — Z85828 Personal history of other malignant neoplasm of skin: Secondary | ICD-10-CM | POA: Insufficient documentation

## 2021-10-26 DIAGNOSIS — K219 Gastro-esophageal reflux disease without esophagitis: Secondary | ICD-10-CM | POA: Diagnosis not present

## 2021-10-26 DIAGNOSIS — Z96612 Presence of left artificial shoulder joint: Secondary | ICD-10-CM | POA: Insufficient documentation

## 2021-10-26 DIAGNOSIS — K5900 Constipation, unspecified: Secondary | ICD-10-CM | POA: Insufficient documentation

## 2021-10-26 DIAGNOSIS — N3000 Acute cystitis without hematuria: Secondary | ICD-10-CM | POA: Diagnosis not present

## 2021-10-26 DIAGNOSIS — R109 Unspecified abdominal pain: Secondary | ICD-10-CM | POA: Diagnosis not present

## 2021-10-26 LAB — COMPREHENSIVE METABOLIC PANEL
ALT: 15 U/L (ref 0–44)
AST: 17 U/L (ref 15–41)
Albumin: 4.3 g/dL (ref 3.5–5.0)
Alkaline Phosphatase: 98 U/L (ref 38–126)
Anion gap: 10 (ref 5–15)
BUN: 19 mg/dL (ref 8–23)
CO2: 23 mmol/L (ref 22–32)
Calcium: 8.7 mg/dL — ABNORMAL LOW (ref 8.9–10.3)
Chloride: 99 mmol/L (ref 98–111)
Creatinine, Ser: 0.96 mg/dL (ref 0.61–1.24)
GFR, Estimated: >60 mL/min (ref >60)
Glucose, Bld: 110 mg/dL — ABNORMAL HIGH (ref 70–99)
Potassium: 4.1 mmol/L (ref 3.5–5.1)
Sodium: 132 mmol/L — ABNORMAL LOW (ref 135–145)
Total Bilirubin: 1 mg/dL (ref 0.3–1.2)
Total Protein: 7 g/dL (ref 6.5–8.1)

## 2021-10-26 LAB — CBC
HCT: 50.2 % (ref 39.0–52.0)
Hemoglobin: 17.2 g/dL — ABNORMAL HIGH (ref 13.0–17.0)
MCH: 31.9 pg (ref 26.0–34.0)
MCHC: 34.3 g/dL (ref 30.0–36.0)
MCV: 93.1 fL (ref 80.0–100.0)
Platelets: 207 10*3/uL (ref 150–400)
RBC: 5.39 MIL/uL (ref 4.22–5.81)
RDW: 14.9 % (ref 11.5–15.5)
WBC: 17.3 10*3/uL — ABNORMAL HIGH (ref 4.0–10.5)
nRBC: 0 % (ref 0.0–0.2)

## 2021-10-26 LAB — LIPASE, BLOOD: Lipase: 16 U/L (ref 11–51)

## 2021-10-26 MED ORDER — IOHEXOL 300 MG/ML  SOLN
100.0000 mL | Freq: Once | INTRAMUSCULAR | Status: AC | PRN
  Administered 2021-10-26: 100 mL via INTRAVENOUS

## 2021-10-26 NOTE — ED Triage Notes (Signed)
Patient reports to the ER for abdominal pain that comes in waves. He reports he has had constipation for most of his adult life. Patient reports he has not had a bowel movement for almost 20 days.

## 2021-10-27 ENCOUNTER — Encounter (HOSPITAL_BASED_OUTPATIENT_CLINIC_OR_DEPARTMENT_OTHER): Payer: Self-pay | Admitting: Emergency Medicine

## 2021-10-27 ENCOUNTER — Emergency Department (HOSPITAL_BASED_OUTPATIENT_CLINIC_OR_DEPARTMENT_OTHER)
Admission: EM | Admit: 2021-10-27 | Discharge: 2021-10-27 | Disposition: A | Discharge status: Home / Self Care | Payer: Medicare Other | Attending: Emergency Medicine | Admitting: Emergency Medicine

## 2021-10-27 DIAGNOSIS — N3000 Acute cystitis without hematuria: Secondary | ICD-10-CM

## 2021-10-27 DIAGNOSIS — K5900 Constipation, unspecified: Secondary | ICD-10-CM | POA: Diagnosis not present

## 2021-10-27 LAB — URINALYSIS, ROUTINE W REFLEX MICROSCOPIC
Bilirubin Urine: NEGATIVE
Glucose, UA: NEGATIVE mg/dL
Hgb urine dipstick: NEGATIVE
Ketones, ur: 15 mg/dL — AB
Nitrite: POSITIVE — AB
Specific Gravity, Urine: >1.046 — ABNORMAL HIGH (ref 1.005–1.030)
pH: 6 (ref 5.0–8.0)

## 2021-10-27 MED ORDER — KETOROLAC TROMETHAMINE 30 MG/ML IJ SOLN
30.0000 mg | Freq: Once | INTRAMUSCULAR | Status: AC
  Administered 2021-10-27: 30 mg via INTRAMUSCULAR
  Filled 2021-10-27: qty 1

## 2021-10-27 MED ORDER — CEPHALEXIN 500 MG PO CAPS: 500.0000 mg | ORAL_CAPSULE | Freq: Four times a day (QID) | ORAL | 0 refills | Status: DC

## 2021-10-27 MED ORDER — ACETAMINOPHEN 500 MG PO TABS
1000.0000 mg | ORAL_TABLET | Freq: Once | ORAL | Status: AC
  Administered 2021-10-27: 1000 mg via ORAL
  Filled 2021-10-27: qty 2

## 2021-10-27 MED ORDER — DOCUSATE SODIUM 100 MG PO CAPS
100.0000 mg | ORAL_CAPSULE | Freq: Once | ORAL | Status: AC
  Administered 2021-10-27: 100 mg via ORAL
  Filled 2021-10-27: qty 1

## 2021-10-27 MED ORDER — CEFTRIAXONE SODIUM 1 G IJ SOLR
1.0000 g | Freq: Once | INTRAMUSCULAR | Status: AC
  Administered 2021-10-27: 1 g via INTRAMUSCULAR
  Filled 2021-10-27: qty 10

## 2021-10-27 NOTE — ED Provider Notes (Signed)
[facility] EMERGENCY DEPT Provider Note   CSN: [id number] Arrival date & time: 10/26/21  2020     History Chief Complaint  Patient presents with   Abdominal Pain    [name] is a 65 y.o. male.  The history is provided by the patient.  Abdominal Pain Pain location:  Generalized Pain quality: cramping   Pain radiates to:  Does not radiate Pain severity:  Moderate Onset quality:  Gradual Duration:  2 weeks Timing:  Constant Progression:  Waxing and waning Chronicity:  New Context: laxative use   Context comment:  Took a laxative for his known constipation and then it got worse. Relieved by:  Nothing Worsened by:  Nothing Ineffective treatments:  None tried Associated symptoms: constipation   Associated symptoms: no belching, no cough, no diarrhea, no dysuria, no fever and no vomiting       Past Medical History:  Diagnosis Date   Anxiety    Chronic insomnia 04/14/2020   Constipation    Depression    Diabetes mellitus without complication (HCC)    Dyslipidemia    GERD (gastroesophageal reflux disease)    Hearing loss    History of echocardiogram    Echo 8/18: EF 50-55, normal wall motion, grade 1 diastolic dysfunction, mild MR   Hypertension    Memory difficulty 10/25/2017   Memory loss    MRSA carrier    Schizoaffective disorder    Seizures ([place])    last 4-5 years ago per pt    Patient Active Problem List   Diagnosis Date Noted   Diabetic peripheral neuropathy associated with type 2 diabetes mellitus ([place]) 07/25/2021   New onset type 2 diabetes mellitus ([place]) 06/27/2021   Squamous cell cancer of skin of left forearm 06/27/2021   Cough 06/27/2021   Ear itching 06/01/2021   Chronic insomnia 88/75/7972   Acute metabolic encephalopathy 82/05/155   Encephalopathy acute 01/24/2019   Closed tibia fracture 11/25/2018   Fracture of tibial shaft, left, closed 11/25/2018   Unintentional weight loss 04/07/2018   Syncope and collapse  12/13/2017   Memory disorder 10/25/2017   Presbyacusia 07/26/2017   Palpitations 07/01/2017   Acute bronchiolitis due to human metapneumovirus    Mild sleep apnea 06/15/2016   Actinic keratosis 06/15/2016   Nocturnal dyspnea 02/03/2016   Skin lesion of face 02/03/2016   Paresthesia of both hands 09/16/2015   Phenytoin toxicity    TIA (transient ischemic attack) 07/29/2015   Vertigo 07/29/2015   Schizoaffective disorder, depressive type with good prognostic features ([place]) 07/13/2015   [place] 05/18/2015   Seborrheic dermatitis of scalp 05/18/2015   MDD (major depressive disorder), recurrent severe, without psychosis ([place])    OCD (obsessive compulsive disorder)    Seizures ([place])    Schizoaffective disorder, depressive type ([place])    Orthostatic hypotension    Fall    Seizure ([place]) 11/18/2014   Schizoaffective disorder, unspecified type ([place])    Essential hypertension    Overdose of beta-adrenergic antagonist drug 10/22/2014   Overdose    Nocturnal enuresis 03/23/2014   Benzodiazepine dependence, continuous ([place]) 01/27/2014   Severe major depression ([place]) 01/21/2014   Frozen shoulder syndrome 09/20/2013   Somnolence 09/20/2013   Schizoaffective disorder ([place]) 09/20/2013   Presbyopia 04/25/2012   Androgen deficiency 09/07/2011   Constipation, chronic 09/07/2011   Other specified disorders of liver 12/30/2008   Overweight(278.02) 09/08/2008   Hyperlipidemia 01/28/2008   Seizure disorder ([place]) 01/28/2008   OSTEOPENIA 07/01/2007    Past Surgical History:  Procedure Laterality Date   CARPAL TUNNEL RELEASE     COLONOSCOPY  12/17/2008   COLONOSCOPY  07/23/2018   Dr.Jacobs   ORIF ANKLE FRACTURE Left 11/26/2018   Procedure: OPEN REDUCTION INTERNAL FIXATION (ORIF) ANKLE FRACTURE;  Surgeon: Hiram Gash, MD;  Location: [facility];  Service: Orthopedics;  Laterality: Left;   ORIF SHOULDER FRACTURE     self orchectomy     SHOULDER CLOSED REDUCTION Left 09/16/2013   Procedure: CLOSED  REDUCTION SHOULDER;  Surgeon: Mauri Pole, MD;  Location: [facility];  Service: Orthopedics;  Laterality: Left;   SHOULDER HEMI-ARTHROPLASTY Left 09/18/2013   Procedure: LEFT SHOULDER HEMI-ARTHROPLASTY;  Surgeon: Augustin Schooling, MD;  Location: [facility];  Service: Orthopedics;  Laterality: Left;   TIBIA IM NAIL INSERTION Left 11/26/2018   Procedure: INTRAMEDULLARY (IM) NAIL TIBIAL;  Surgeon: Hiram Gash, MD;  Location: [facility];  Service: Orthopedics;  Laterality: Left;   UPPER GASTROINTESTINAL ENDOSCOPY  07/23/2018   Dr.Jacobs       Family History  Problem Relation Age of Onset   Stroke Father        Living at 2   Stroke Mother        Stroke in late 64's. Died in her 39's   Alcohol abuse Brother    Alcohol abuse Brother    Diabetes Brother    Colon cancer Neg Hx    Esophageal cancer Neg Hx    Stomach cancer Neg Hx    Rectal cancer Neg Hx     Social History   Tobacco Use   Smoking status: Never    Passive exposure: Never   Smokeless tobacco: Never  Vaping Use   Vaping Use: Never used  Substance Use Topics   Alcohol use: No    Alcohol/week: 0.0 standard drinks   Drug use: No    Home Medications Prior to Admission medications   Medication Sig Start Date End Date Taking? Authorizing Provider  cephALEXin (KEFLEX) 500 MG capsule Take 1 capsule (500 mg total) by mouth 4 (four) times daily. 10/27/21  Yes Kalysta Kneisley, MD  acetaminophen (TYLENOL) 500 MG tablet Take 1,000 mg by mouth every 6 (six) hours as needed for mild pain or moderate pain.    [provider]  amitriptyline (ELAVIL) 100 MG tablet Take 2 tablets (200 mg total) by mouth at bedtime. 09/18/21   Arfeen, Arlyce Harman, MD  aspirin EC 81 MG tablet Take 81 mg by mouth daily. Swallow whole.    [provider]  atorvastatin (LIPITOR) 40 MG tablet TAKE 1 TABLET BY MOUTH EVERY DAY 02/06/21   Leeanne Rio, MD  Lacosamide (VIMPAT) 150 MG TABS Take 1 tablet (150 mg total) by mouth 2 (two) times daily.  04/13/21   Lomax, Amy, NP  losartan (COZAAR) 25 MG tablet TAKE 1 TABLET BY MOUTH EVERY DAY 07/13/21   Leeanne Rio, MD  metFORMIN (GLUCOPHAGE-XR) 500 MG 24 hr tablet TAKE 1 TABLET BY MOUTH EVERY DAY WITH BREAKFAST 09/25/21   Lind Covert, MD  metoprolol succinate (TOPROL-XL) 100 MG 24 hr tablet TAKE 2 TABLETS BY MOUTH AT BEDTIME *TAKE WITH OR IMMEDIATELY FOLLOWING A MEAL* 10/11/21   Leeanne Rio, MD  Multiple Vitamin (MULTIVITAMIN WITH MINERALS) TABS tablet Take 1 tablet by mouth daily.    [provider]  Na Sulfate-K Sulfate-Mg Sulf 17.5-3.13-1.6 GM/177ML SOLN Take 1 kit by mouth as directed. May use generic Suprep 09/22/21   Milus Banister, MD  polyethylene glycol powder (  [facility] PURELAX) powder TAKE 17 GRAMS 2 TIMES A DAY AS NEEDED FOR CONSTIPATION. MIX INTO 8 OF FLUID AND DRINK Patient taking differently: Take 17 g by mouth daily. Mix into 8 oz of fluid and drink 09/18/18   Dickie La, MD  QUEtiapine (SEROQUEL) 400 MG tablet Take 1 tablet (400 mg total) by mouth at bedtime. 09/18/21   Arfeen, Arlyce Harman, MD  Suvorexant (BELSOMRA) 10 MG TABS Take 10 mg by mouth at bedtime. 09/18/21   Kathlee Nations, MD  testosterone cypionate (DEPOTESTOSTERONE CYPIONATE) 200 MG/ML injection SMARTSIG:1 Milliliter(s) IM Every 2 Weeks 10/01/19   [provider]    Allergies    Codeine, Sulfa antibiotics, and Sulfasalazine  Review of Systems   Review of Systems  Constitutional:  Negative for fever.  HENT:  Negative for congestion.   Eyes:  Negative for photophobia.  Respiratory:  Negative for cough.   Gastrointestinal:  Positive for abdominal pain and constipation. Negative for diarrhea and vomiting.  Genitourinary:  Negative for dysuria.  Musculoskeletal:  Negative for neck pain and neck stiffness.  Skin:  Negative for rash.  Neurological:  Negative for facial asymmetry.  Psychiatric/Behavioral:  Negative for agitation.   All other systems reviewed and are  negative.  Physical Exam Updated Vital Signs BP 123/79   Pulse 83   Temp 98.6 F (37 C)   Resp 16   SpO2 95%   Physical Exam Vitals and nursing note reviewed.  Constitutional:      General: He is not in acute distress.    Appearance: He is well-developed.  HENT:     Head: Normocephalic and atraumatic.     Nose: Nose normal.  Eyes:     Conjunctiva/sclera: Conjunctivae normal.     Pupils: Pupils are equal, round, and reactive to light.  Cardiovascular:     Rate and Rhythm: Normal rate and regular rhythm.     Pulses: Normal pulses.     Heart sounds: Normal heart sounds.  Pulmonary:     Effort: Pulmonary effort is normal.     Breath sounds: Normal breath sounds.  Abdominal:     General: Bowel sounds are normal.     Palpations: Abdomen is soft.     Tenderness: There is no abdominal tenderness. There is no guarding or rebound.  Musculoskeletal:        General: Normal range of motion.     Cervical back: Normal range of motion.  Skin:    General: Skin is warm and dry.     Capillary Refill: Capillary refill takes less than 2 seconds.  Neurological:     General: No focal deficit present.     Mental Status: He is alert.     Deep Tendon Reflexes: Reflexes normal.  Psychiatric:        Mood and Affect: Mood normal.        Behavior: Behavior normal.    ED Results / Procedures / Treatments   Labs (all labs ordered are listed, but only abnormal results are displayed) Results for orders placed or performed during the hospital encounter of 10/27/21  Lipase, blood  Result Value Ref Range   Lipase 16 11 - 51 U/L  Comprehensive metabolic panel  Result Value Ref Range   Sodium 132 (L) 135 - 145 mmol/L   Potassium 4.1 3.5 - 5.1 mmol/L   Chloride 99 98 - 111 mmol/L   CO2 23 22 - 32 mmol/L   Glucose, Bld 110 (H) 70 - 99 mg/dL  BUN 19 8 - 23 mg/dL   Creatinine, Ser 0.96 0.61 - 1.24 mg/dL   Calcium 8.7 (L) 8.9 - 10.3 mg/dL   Total Protein 7.0 6.5 - 8.1 g/dL   Albumin 4.3 3.5 -  5.0 g/dL   AST 17 15 - 41 U/L   ALT 15 0 - 44 U/L   Alkaline Phosphatase 98 38 - 126 U/L   Total Bilirubin 1.0 0.3 - 1.2 mg/dL   GFR, Estimated >60 >60 mL/min   Anion gap 10 5 - 15  CBC  Result Value Ref Range   WBC 17.3 (H) 4.0 - 10.5 K/uL   RBC 5.39 4.22 - 5.81 MIL/uL   Hemoglobin 17.2 (H) 13.0 - 17.0 g/dL   HCT 50.2 39.0 - 52.0 %   MCV 93.1 80.0 - 100.0 fL   MCH 31.9 26.0 - 34.0 pg   MCHC 34.3 30.0 - 36.0 g/dL   RDW 14.9 11.5 - 15.5 %   Platelets 207 150 - 400 K/uL   nRBC 0.0 0.0 - 0.2 %  Urinalysis, Routine w reflex microscopic Urine, In & Out Cath  Result Value Ref Range   Color, Urine YELLOW YELLOW   APPearance CLEAR CLEAR   Specific Gravity, Urine >1.046 (H) 1.005 - 1.030   pH 6.0 5.0 - 8.0   Glucose, UA NEGATIVE NEGATIVE mg/dL   Hgb urine dipstick NEGATIVE NEGATIVE   Bilirubin Urine NEGATIVE NEGATIVE   Ketones, ur 15 (A) NEGATIVE mg/dL   Protein, ur TRACE (A) NEGATIVE mg/dL   Nitrite POSITIVE (A) NEGATIVE   Leukocytes,Ua SMALL (A) NEGATIVE   RBC / HPF 0-5 0 - 5 RBC/hpf   WBC, UA 11-20 0 - 5 WBC/hpf   Bacteria, UA MANY (A) NONE SEEN   Squamous Epithelial / LPF 0-5 0 - 5   *Note: Due to a large number of results and/or encounters for the requested time period, some results have not been displayed. A complete set of results can be found in Results Review.   CT ABDOMEN PELVIS W CONTRAST  Result Date: 10/26/2021 CLINICAL DATA:  Abdominal pain, constipation EXAM: CT ABDOMEN AND PELVIS WITH CONTRAST TECHNIQUE: Multidetector CT imaging of the abdomen and pelvis was performed using the standard protocol following bolus administration of intravenous contrast. CONTRAST:  177m OMNIPAQUE IOHEXOL 300 MG/ML  SOLN COMPARISON:  04/18/2018 FINDINGS: Lower chest: Lung bases are essentially clear. Hepatobiliary: 2.6 cm hemangioma in the right hepatic dome (series 2/image 12). Liver is otherwise within normal limits. Gallbladder is unremarkable. No intrahepatic or extrahepatic ductal  dilatation. Pancreas: Within normal limits. Spleen: Within normal limits. Adrenals/Urinary Tract: Adrenal glands are within normal limits. Kidneys are within normal limits.  No hydronephrosis. Mildly thick-walled bladder with trace nondependent gas. Stomach/Bowel: Stomach is within normal limits. No evidence of bowel obstruction. Normal appendix (series 2/image 65). Moderate colonic stool burden, suggesting constipation. Vascular/Lymphatic: No evidence of abdominal aortic aneurysm. Atherosclerotic calcifications of the abdominal aorta and branch vessels. No suspicious abdominopelvic lymphadenopathy. Reproductive: Heterogeneous enhancement along the right peripheral zone of the prostate (series 2/image 88), nonspecific. Other: No abdominopelvic ascites. Tiny fat containing bilateral inguinal hernias. Musculoskeletal: Mild degenerative changes of the lumbar spine. IMPRESSION: Moderate colonic stool burden, suggesting constipation. Mildly thick-walled bladder with trace nondependent gas. Correlate for recent intervention. Otherwise, this appearance may reflect cystitis. Heterogeneous enhancement of the prostate, nonspecific. Correlate with PSA as clinically warranted. Electronically Signed   By: SJulian HyM.D.   On: 10/26/2021 23:24     Radiology CT ABDOMEN PELVIS  W CONTRAST  Result Date: 10/26/2021 CLINICAL DATA:  Abdominal pain, constipation EXAM: CT ABDOMEN AND PELVIS WITH CONTRAST TECHNIQUE: Multidetector CT imaging of the abdomen and pelvis was performed using the standard protocol following bolus administration of intravenous contrast. CONTRAST:  111m OMNIPAQUE IOHEXOL 300 MG/ML  SOLN COMPARISON:  04/18/2018 FINDINGS: Lower chest: Lung bases are essentially clear. Hepatobiliary: 2.6 cm hemangioma in the right hepatic dome (series 2/image 12). Liver is otherwise within normal limits. Gallbladder is unremarkable. No intrahepatic or extrahepatic ductal dilatation. Pancreas: Within normal limits.  Spleen: Within normal limits. Adrenals/Urinary Tract: Adrenal glands are within normal limits. Kidneys are within normal limits.  No hydronephrosis. Mildly thick-walled bladder with trace nondependent gas. Stomach/Bowel: Stomach is within normal limits. No evidence of bowel obstruction. Normal appendix (series 2/image 65). Moderate colonic stool burden, suggesting constipation. Vascular/Lymphatic: No evidence of abdominal aortic aneurysm. Atherosclerotic calcifications of the abdominal aorta and branch vessels. No suspicious abdominopelvic lymphadenopathy. Reproductive: Heterogeneous enhancement along the right peripheral zone of the prostate (series 2/image 88), nonspecific. Other: No abdominopelvic ascites. Tiny fat containing bilateral inguinal hernias. Musculoskeletal: Mild degenerative changes of the lumbar spine. IMPRESSION: Moderate colonic stool burden, suggesting constipation. Mildly thick-walled bladder with trace nondependent gas. Correlate for recent intervention. Otherwise, this appearance may reflect cystitis. Heterogeneous enhancement of the prostate, nonspecific. Correlate with PSA as clinically warranted. Electronically Signed   By: SJulian HyM.D.   On: 10/26/2021 23:24    Procedures Procedures   Medications Ordered in ED Medications  cefTRIAXone (ROCEPHIN) injection 1 g (has no administration in time range)  iohexol (OMNIPAQUE) 300 MG/ML solution 100 mL (100 mLs Intravenous Contrast Given 10/26/21 2307)  acetaminophen (TYLENOL) tablet 1,000 mg (1,000 mg Oral Given 10/27/21 0025)  ketorolac (TORADOL) 30 MG/ML injection 30 mg (30 mg Intramuscular Given 10/27/21 0025)  docusate sodium (COLACE) capsule 100 mg (100 mg Oral Given 10/27/21 0025)    ED Course  I have reviewed the triage vital signs and the nursing notes.  Pertinent labs & imaging results that were available during my care of the patient were reviewed by me and considered in my medical decision making (see chart for  details).   Patient is refusing enema, suppository, mag citrate.  He does not want to have worse cramping.  Have discussed cleared liquid diet and miralax 3 times daily and he is agreeable to this plan.  He wanted to leave but I talked him into staying for urine.  He has a significant UTi, without signs of sepsis.  Rocephin given. Urine cultured.  Keflex initiated.  Follow up with your PMD,  strict return precautions given.    JRAZA BAYLESSwas evaluated in Emergency Department on 10/27/2021 for the symptoms described in the history of present illness. He was evaluated in the context of the global COVID-19 pandemic, which necessitated consideration that the patient might be at risk for infection with the SARS-CoV-2 virus that causes COVID-19. Institutional protocols and algorithms that pertain to the evaluation of patients at risk for COVID-19 are in a state of rapid change based on information released by regulatory bodies including the CDC and federal and state organizations. These policies and algorithms were followed during the patient's care in the ED.  Final Clinical Impression(s) / ED Diagnoses Final diagnoses:  Constipation, unspecified constipation type  Acute cystitis without hematuria   Return for intractable cough, coughing up blood, fevers > 100.4 unrelieved by medication, shortness of breath, intractable vomiting, chest pain, shortness of breath, weakness, numbness, changes in speech,  facial asymmetry, abdominal pain, passing out, Inability to tolerate liquids or food, cough, altered mental status or any concerns. No signs of systemic illness or infection. The patient is nontoxic-appearing on exam and vital signs are within normal limits.  I have reviewed the triage vital signs and the nursing notes. Pertinent labs & imaging results that were available during my care of the patient were reviewed by me and considered in my medical decision making (see chart for details). After history, exam,  and medical workup I feel the patient has been appropriately medically screened and is safe for discharge home. Pertinent diagnoses were discussed with the patient. Patient was given return precautions.      Rx / DC Orders ED Discharge Orders          Ordered    cephALEXin (KEFLEX) 500 MG capsule  4 times daily        10/27/21 0202             Mccabe Gloria, MD 10/27/21 0207

## 2021-10-27 NOTE — Discharge Instructions (Addendum)
Miralax three  times daily   Take all antibiotics

## 2021-10-29 LAB — URINE CULTURE
Culture: >=100000 — AB
Special Requests: NORMAL

## 2021-10-30 ENCOUNTER — Telehealth: Payer: Self-pay | Admitting: Emergency Medicine

## 2021-10-30 NOTE — Telephone Encounter (Signed)
Post ED Visit - Positive Culture Follow-up  Culture report reviewed by antimicrobial stewardship pharmacist: [facility] Team []  Elenor Quinones, Pharm.D. []  Heide Guile, Pharm.D., BCPS AQ-ID []  Parks Neptune, Pharm.D., BCPS []  Alycia Rossetti, Pharm.D., BCPS []  [place], Pharm.D., BCPS, AAHIVP []  Legrand Como, Pharm.D., BCPS, AAHIVP []  Salome Arnt, PharmD, BCPS []  Johnnette Gourd, PharmD, BCPS []  Hughes Better, PharmD, BCPS []  [name], PharmD []  Laqueta Linden, PharmD, BCPS []  Albertina Parr, PharmD Arturo Morton, PharmD  [facility] Team []  Leodis Sias, PharmD []  Lindell Spar, PharmD []  Royetta Asal, PharmD []  Graylin Shiver, Rph []  Rema Fendt) Glennon Mac, PharmD []  Arlyn Dunning, PharmD []  Netta Cedars, PharmD []  Dia Sitter, PharmD []  Leone Haven, PharmD []  Gretta Arab, PharmD []  Theodis Shove, PharmD []  Peggyann Juba, PharmD []  Reuel Boom, PharmD   Positive urine culture Treated with cephalexin, organism sensitive to the same and no further patient follow-up is required at this time.  Hazle Nordmann 10/30/2021, 11:14 AM

## 2021-12-19 ENCOUNTER — Other Ambulatory Visit: Payer: Self-pay

## 2021-12-19 ENCOUNTER — Encounter (HOSPITAL_COMMUNITY): Payer: Self-pay | Admitting: Psychiatry

## 2021-12-19 ENCOUNTER — Telehealth (HOSPITAL_BASED_OUTPATIENT_CLINIC_OR_DEPARTMENT_OTHER): Payer: 59 | Admitting: Psychiatry

## 2021-12-19 DIAGNOSIS — F250 Schizoaffective disorder, bipolar type: Secondary | ICD-10-CM

## 2021-12-19 DIAGNOSIS — F411 Generalized anxiety disorder: Secondary | ICD-10-CM

## 2021-12-19 DIAGNOSIS — F5101 Primary insomnia: Secondary | ICD-10-CM

## 2021-12-19 MED ORDER — BELSOMRA 10 MG PO TABS: 10.0000 mg | ORAL_TABLET | Freq: Every day | ORAL | 2 refills | Status: DC

## 2021-12-19 MED ORDER — QUETIAPINE FUMARATE 400 MG PO TABS: 400.0000 mg | ORAL_TABLET | Freq: Every day | ORAL | 2 refills | Status: DC

## 2021-12-19 MED ORDER — AMITRIPTYLINE HCL 100 MG PO TABS: 200.0000 mg | ORAL_TABLET | Freq: Every day | ORAL | 2 refills | Status: DC

## 2021-12-19 NOTE — Progress Notes (Signed)
Virtual Visit via Telephone Note  I connected with [name] on 12/19/21 at  3:20 PM EST by telephone and verified that I am speaking with the correct person using two identifiers.  Location: Patient: Home Provider: Home Office   I discussed the limitations, risks, security and privacy concerns of performing an evaluation and management service by telephone and the availability of in person appointments. I also discussed with the patient that there may be a patient responsible charge related to this service. The patient expressed understanding and agreed to proceed.   History of Present Illness: Patient is evaluated by phone session.  He has a quite Christmas.  He does not go outside or leave the house.  He has chronic hallucination, paranoia but under control.  He does not like when the girl he is living with him as to go outside.  He feels very scared.  He even take the garbage at night when no one can see him.  He feels a current medicine keeping him stable and if he tried to go outside he gets more paranoid.  He does not like people around him.  He sleeps 7 to 8 hours.  He has no tremor or shakes or any EPS.  He is watching his diet and he feels his blood sugar has been more stable as he checks every day in the morning.  He is on metformin.  His weight is stable.  Denies any suicidal thoughts.  Denies any anger, mood swing.  Since taking the medication on time he denies any panic attack.  He liked Belsomra which is helping 8 hours of sleep.  Past Psychiatric History:  H/O depression, paranoia, suicidal attempt, delusions and anxiety. H/O at multiple inpatient.  H/O cutting his testicles because of depression. Thoughts of inhaling helium to end life. Last inpatient August 2016.  D/C on Seroquel 900 mg and amitriptyline 400 mg at bedtime. Saw at [facility] and [name]. SSRI caused reactions. Tried nortriptyline, Haldol, Geodon, Seroquel, lithium, Prozac, Wellbutrin, Paxil, Klonopin, Ativan,  Lexapro, Zoloft, abilify and Lamictal.  No H/O mania or drug use.  We did GeneSight testing and results are in the chart.  Only antipsychotic medication that is unfavorable section is Saphris and Navane.  Most of the SSRIs are also unfavorable section.     Psychiatric Specialty Exam: Physical Exam  Review of Systems  Weight 212 lb (96.2 kg).There is no height or weight on file to calculate BMI.  General Appearance: NA  Eye Contact:  NA  Speech:  Slow  Volume:  Decreased  Mood:  Anxious  Affect:  NA  Thought Process:  Descriptions of Associations: Intact  Orientation:  Full (Time, Place, and Person)  Thought Content:  Hallucinations: Auditory Male voices talking to him, Paranoid Ideation, and Rumination  Suicidal Thoughts:  No  Homicidal Thoughts:  No  Memory:  Immediate;   Fair Recent;   Fair Remote;   Fair  Judgement:  Fair  Insight:  Shallow  Psychomotor Activity:  NA  Concentration:  Concentration: Fair and Attention Span: Fair  Recall:  [employer] of Knowledge:  Fair  Language:  Fair  Akathisia:  No  Handed:  Right  AIMS (if indicated):     Assets:  Communication Skills Desire for Improvement Housing  ADL's:  Intact  Cognition:  Impaired,  Mild  Sleep:   8 hrs      Assessment and Plan: Schizoaffective disorder, bipolar type.  Generalized anxiety disorder.  Primary insomnia.  Patient  overall is stable on his medication but tends to get more paranoid when he go outside and around people.  He prefer to take his garbage at night or go outside when no one can see him.  I have offered therapy multiple times but he declined.  He wants to keep the current medication.  Continue amitriptyline 200 mg at bedtime, Belsomra 10 mg at bedtime and Seroquel 400 mg at bedtime.  Recommended to call us back with any question or any concern.  Follow-up in 3 months.  Follow Up Instructions:    I discussed the assessment and treatment plan with the patient. The patient was provided an  opportunity to ask questions and all were answered. The patient agreed with the plan and demonstrated an understanding of the instructions.   The patient was advised to call back or seek an in-person evaluation if the symptoms worsen or if the condition fails to improve as anticipated.  I provided 17 minutes of non-face-to-face time during this encounter.   Kathlee Nations, MD

## 2021-12-25 ENCOUNTER — Ambulatory Visit: Payer: Medicare Other | Admitting: Gastroenterology

## 2022-01-04 DIAGNOSIS — R350 Frequency of micturition: Secondary | ICD-10-CM | POA: Diagnosis not present

## 2022-01-21 ENCOUNTER — Other Ambulatory Visit: Payer: Self-pay | Admitting: Family Medicine

## 2022-01-29 NOTE — Patient Instructions (Incomplete)
It was wonderful to see you today.  Please bring ALL of your medications with you to every visit.   Today we talked about:  -Your A1c today was ***, the goal is under 7.  -I am sending a referral to ophthalmology for a diabetic eye exam, they should call you for an appoitment. -I am sending a prescription for the shingles vaccine. Please get this at your earliest convenience. Most common side effects of the shingles vaccine include sore arm, headache, fever, and muscle aches following the vaccine so plan accordingly.    Diet Recommendations for Diabetes  Carbohydrate includes starch, sugar, and fiber.  Of these, only sugar and starch raise blood glucose.  (Fiber is found in fruits, vegetables [especially skin, seeds, and stalks], whole grains, and beans.)   Starchy (carb) foods: Bread, rice, pasta, potatoes, corn, cereal, grits, crackers, bagels, muffins, all baked goods.  (Fruit, milk, and yogurt also have carbohydrate, but most of these foods will not spike your blood sugar as most starchy or sweet foods will.)  A few fruits do cause high blood sugars; use small portions of bananas (limit to 1/2 at a time), grapes, watermelon, and oranges.   Protein foods: Meat, fish, poultry, eggs, dairy foods, and beans such as pinto and kidney beans (beans also provide carbohydrate).   1. Eat at least 3 REAL meals and 1-2 snacks per day. Eat breakfast within the first hour of getting up.  Have something to eat at least every 5 hours while awake.   2. Limit starchy foods to TWO per meal and ONE per snack. ONE portion of a starchy food is equal to the following:   - ONE slice of bread (or its equivalent, such as half of a hamburger bun).   - 1/2 cup of a "scoopable" starchy food such as potatoes or rice.   - 15 grams of Total Carbohydrate as shown on food label.   - Every 4 ounces of a sweet drink (including fruit juice). 3. Include twice the volume of vegetables as protein or carbohydrate foods for BOTH  lunch and dinner as often as you can.   - Fresh or frozen vegetables are best.   - Keep frozen vegetables on hand for a quick option.          Thank you for choosing [facility].   Please call [phone] with any questions about today's appointment.  Please be sure to schedule follow up at the front  desk before you leave today.   Sharion Settler, DO PGY-2 Family Medicine

## 2022-01-29 NOTE — Progress Notes (Deleted)
° ° °  SUBJECTIVE:   CHIEF COMPLAINT / HPI:   Follow Up T2DM *** [name] is a 66 y.o. male who presents to the [facility] clinic today to discuss ***. Last A1c 6.1 in August 2022. Overdue for eye examination. Current medication only notable for Metformin 500 mg. Reports good compliance without adverse effects. *** On high-intensity statin and ARB for cardiovascular and renal protection. Denies symptoms of hypoglycemia. ***   Health Maintenance Overdue for shingles, influenza, COVID booster vaccines.   PERTINENT  PMH / PSH: HTN, TIA, Seizure disorder, T2DM, MDD, schizoaffective disorder  OBJECTIVE:   There were no vitals taken for this visit. ***  General: NAD, pleasant, able to participate in exam Cardiac: RRR, no murmurs. Respiratory: CTAB, normal effort, No wheezes, rales or rhonchi Abdomen: Bowel sounds present, nontender, nondistended, no hepatosplenomegaly. Extremities: no edema or cyanosis. Skin: warm and dry, no rashes noted Neuro: alert, no obvious focal deficits Psych: Normal affect and mood  ASSESSMENT/PLAN:   No problem-specific Assessment & Plan notes found for this encounter.     Sharion Settler, [place]

## 2022-01-31 ENCOUNTER — Ambulatory Visit: Payer: Commercial Managed Care - HMO | Admitting: Family Medicine

## 2022-02-08 ENCOUNTER — Ambulatory Visit (INDEPENDENT_AMBULATORY_CARE_PROVIDER_SITE_OTHER): Payer: Medicare Other | Admitting: Student

## 2022-02-08 ENCOUNTER — Other Ambulatory Visit: Payer: Self-pay

## 2022-02-08 ENCOUNTER — Encounter: Payer: Self-pay | Admitting: Student

## 2022-02-08 ENCOUNTER — Ambulatory Visit (INDEPENDENT_AMBULATORY_CARE_PROVIDER_SITE_OTHER): Payer: Medicare Other

## 2022-02-08 VITALS — BP 132/76 | HR 87 | Wt 201.2 lb

## 2022-02-08 DIAGNOSIS — E119 Type 2 diabetes mellitus without complications: Secondary | ICD-10-CM | POA: Diagnosis not present

## 2022-02-08 DIAGNOSIS — R933 Abnormal findings on diagnostic imaging of other parts of digestive tract: Secondary | ICD-10-CM | POA: Insufficient documentation

## 2022-02-08 DIAGNOSIS — Z23 Encounter for immunization: Secondary | ICD-10-CM

## 2022-02-08 DIAGNOSIS — D72829 Elevated white blood cell count, unspecified: Secondary | ICD-10-CM

## 2022-02-08 DIAGNOSIS — L570 Actinic keratosis: Secondary | ICD-10-CM | POA: Diagnosis not present

## 2022-02-08 LAB — POCT GLYCOSYLATED HEMOGLOBIN (HGB A1C): HbA1c, POC (controlled diabetic range): 6.1 % (ref 0.0–7.0)

## 2022-02-08 MED ORDER — METFORMIN HCL ER 500 MG PO TB24: 1000.0000 mg | ORAL_TABLET | Freq: Every evening | ORAL | 3 refills | Status: DC

## 2022-02-08 NOTE — Patient Instructions (Signed)
[name], It is such a joy to take care you! Thank you for coming in today.   As a reminder, here is a recap of what we talked about today:  - Your A1c today was at your goal! Congratulations. Keep up the hard work of cutting back on your sugar intake. It's making a difference! - You can take both of your metformin pills at night (1000mg  total) - Please call your GI doctor to reschedule your colonoscopy  We are checking some labs today. I will call you if they are abnormal. I will send you a MyChart message or a letter if they are normal.  If you do not hear about your labs in the next 2 weeks please let us know.  I recommend that you always bring your medications to each appointment as this makes it easy to ensure we are on the correct medications and helps [place] not miss when refills are needed.  Take care and seek immediate care sooner if you develop any concerns.   Marnee Guarneri, MD [place]

## 2022-02-08 NOTE — Assessment & Plan Note (Signed)
Lesion on face consistent with AK. No evidence of malignant transformation at this time.  Recommended eventual excision given history of SCC but patient prefers watchful waiting for now.

## 2022-02-08 NOTE — Assessment & Plan Note (Signed)
Patient is overdue for colonoscopy.  - Patient to call GI to reschedule missed exam from November - Encourage him to call our office if there are issues with scheduling

## 2022-02-08 NOTE — Progress Notes (Signed)
° ° °  SUBJECTIVE:   CHIEF COMPLAINT / HPI:   Diabetic Follow Up: Patient is a 66 y.o. male who present today for diabetic follow up.  This is a recent diagnosis for him and he is adjusting to dietary changes, including cutting back on his sugar intake. He has found that this has not been a particularly difficult change.  His home medication regimen is metformin 500 mg BID. He endorses difficulty remembering his morning dose of metformin. He takes the rest of his meds at night and therefore does not always remember the morning dose. I explained that since he is on the XR formulation, he should be able to take both pills (1000mg ) once daily at nighttime if that is his preference.  Lesion on Scalp Patient presents with a non-tender, ~6-27mm lesion along his R temple/hairline. It has been there for months or years and is not particularly bothersome to him. He has a history of both Actinic Keratosis and Squamous Cell Carcinoma.   History of Tubular Adenomas Patient has a history of tubular adenomas identified on colonoscopy 3 years ago.  He was supposed to have had a repeat colonoscopy in October 2022 but this was not completed due to issues with transportation.  The patient has not yet called back the GI office to reschedule as he feels embarrassed about having shown up without the proper transportation arrangements.  He feels confident that he will be able to call them back and follow-up on this.  Leukocytosis   UTI Patient was seen in the ED in November for generalized abdominal pain, found to have UTI without signs of sepsis.  He was treated with Rocephin.  WBC at that time was 17.3.   OBJECTIVE:   BP 132/76    Pulse 87    Wt 201 lb 4 oz (91.3 kg)    SpO2 97%    BMI 28.07 kg/m   Skin: Small, "stuck on" appearing lesion on the right temporal hairline, without hypervascularity, nodularity, ulceration.   ASSESSMENT/PLAN:   Leukocytosis Likely just elevated in the setting of acute UTI but  given degree of elevation to >17, will recheck a CBC to ensure resolution.   Actinic keratosis Lesion on face consistent with AK. No evidence of malignant transformation at this time.  Recommended eventual excision given history of SCC but patient prefers watchful waiting for now.   New onset type 2 diabetes mellitus (HCC) A1c 6.1% today. At goal. Okay to move metformin to 1000mg  once daily at nighttime as that is his preference. - Congratulated on successful diet interventions  Abnormal colonoscopy Patient is overdue for colonoscopy.  - Patient to call GI to reschedule missed exam from November - Encourage him to call our office if there are issues with scheduling     Pearla Dubonnet, MD [place]

## 2022-02-08 NOTE — Assessment & Plan Note (Signed)
A1c 6.1% today. At goal. Okay to move metformin to 1000mg  once daily at nighttime as that is his preference. - Congratulated on successful diet interventions

## 2022-02-08 NOTE — Assessment & Plan Note (Signed)
Likely just elevated in the setting of acute UTI but given degree of elevation to >17, will recheck a CBC to ensure resolution.

## 2022-02-08 NOTE — Assessment & Plan Note (Signed)
>>  ASSESSMENT AND PLAN FOR NEW ONSET TYPE 2 DIABETES MELLITUS ([place]) WRITTEN ON 02/08/2022  2:26 PM BY SANFORD, JAMES B, MD  A1c 6.1% today. At goal. Okay to move metformin to 1000mg  once daily at nighttime as that is his preference. - Congratulated on successful diet interventions

## 2022-02-09 ENCOUNTER — Encounter: Payer: Self-pay | Admitting: Gastroenterology

## 2022-02-09 ENCOUNTER — Encounter: Payer: Self-pay | Admitting: Student

## 2022-02-09 LAB — CBC
Hematocrit: 42.7 % (ref 37.5–51.0)
Hemoglobin: 13.4 g/dL (ref 13.0–17.7)
MCH: 25.5 pg — ABNORMAL LOW (ref 26.6–33.0)
MCHC: 31.4 g/dL — ABNORMAL LOW (ref 31.5–35.7)
MCV: 81 fL (ref 79–97)
Platelets: 226 10*3/uL (ref 150–450)
RBC: 5.25 x10E6/uL (ref 4.14–5.80)
RDW: 18.3 % — ABNORMAL HIGH (ref 11.6–15.4)
WBC: 8.8 10*3/uL (ref 3.4–10.8)

## 2022-02-21 ENCOUNTER — Telehealth: Payer: Self-pay

## 2022-02-21 NOTE — Telephone Encounter (Signed)
[name],  ?Please review this patient's anesthesia record and advise if this patient is appropriate for the colon at [facility]. ? ?

## 2022-02-22 NOTE — Telephone Encounter (Signed)
Called patient, no answer. Left a message for the patient to call us back. He needs to make appointment for new GI OV with Dr.Jacobs. PV and colonoscopy cancelled at this time.  ?

## 2022-02-22 NOTE — Telephone Encounter (Signed)
Dr.Jacobs, ? ?Patient is a difficult intubation and needs colonoscopy at hospital. Okay for direct hospital or OV?  ?Please advise. Thank you, Khalila Buechner pv ?

## 2022-02-23 NOTE — Telephone Encounter (Signed)
Patient has not returned this call. Mailed letter to patient to inform him that an office visit is needed. ?

## 2022-03-05 ENCOUNTER — Other Ambulatory Visit: Payer: Self-pay | Admitting: Family Medicine

## 2022-03-05 DIAGNOSIS — E785 Hyperlipidemia, unspecified: Secondary | ICD-10-CM

## 2022-03-06 ENCOUNTER — Other Ambulatory Visit: Payer: Self-pay | Admitting: Family Medicine

## 2022-03-06 DIAGNOSIS — I10 Essential (primary) hypertension: Secondary | ICD-10-CM

## 2022-03-18 ENCOUNTER — Other Ambulatory Visit (HOSPITAL_COMMUNITY): Payer: Self-pay | Admitting: Psychiatry

## 2022-03-18 DIAGNOSIS — F5101 Primary insomnia: Secondary | ICD-10-CM

## 2022-03-19 ENCOUNTER — Telehealth (HOSPITAL_BASED_OUTPATIENT_CLINIC_OR_DEPARTMENT_OTHER): Payer: Medicare Other | Admitting: Psychiatry

## 2022-03-19 ENCOUNTER — Encounter (HOSPITAL_COMMUNITY): Payer: Self-pay | Admitting: Psychiatry

## 2022-03-19 DIAGNOSIS — F411 Generalized anxiety disorder: Secondary | ICD-10-CM | POA: Diagnosis not present

## 2022-03-19 DIAGNOSIS — F5101 Primary insomnia: Secondary | ICD-10-CM | POA: Diagnosis not present

## 2022-03-19 DIAGNOSIS — F250 Schizoaffective disorder, bipolar type: Secondary | ICD-10-CM | POA: Diagnosis not present

## 2022-03-19 MED ORDER — QUETIAPINE FUMARATE 400 MG PO TABS: 400.0000 mg | ORAL_TABLET | Freq: Every day | ORAL | 2 refills | Status: DC

## 2022-03-19 MED ORDER — BELSOMRA 10 MG PO TABS: 10.0000 mg | ORAL_TABLET | Freq: Every day | ORAL | 2 refills | Status: DC

## 2022-03-19 MED ORDER — AMITRIPTYLINE HCL 100 MG PO TABS: 200.0000 mg | ORAL_TABLET | Freq: Every day | ORAL | 2 refills | Status: DC

## 2022-03-19 NOTE — Progress Notes (Signed)
Virtual Visit via Telephone Note ? ?I connected with [name] on 03/19/22 at  3:20 PM EDT by telephone and verified that I am speaking with the correct person using two identifiers. ? ?Location: ?Patient: Home ?Provider: Home Office ?  ?I discussed the limitations, risks, security and privacy concerns of performing an evaluation and management service by telephone and the availability of in person appointments. I also discussed with the patient that there may be a patient responsible charge related to this service. The patient expressed understanding and agreed to proceed. ? ? ?History of Present Illness: ?Patient is evaluated by phone session.  He is actually doing much better.  He is sleeping good and lately denies any hallucination but does still have paranoia and sometimes ruminative thoughts.  He started walking every day which helps his anxiety.  He lost few pounds and his last hemoglobin A1c is also stable and 6.1.  He lives with a girl who does help him for groceries and take to the doctor's appointment but there is a possibility she moved out in few months.  Patient is not as anxious as he has alternative plan like using [employer] or get a ride if needed for the doctor's appointment.  He feels the current medicine is working.  He has no tremor or shakes or any EPS.  His appetite is okay.  Denies any suicidal thoughts.  He takes Belsomra which helps his sleep and usually he takes 3-4 times a week.  He denies any major panic attack. ? ?Past Psychiatric History:  ?H/O depression, paranoia, suicidal attempt, delusions and multiple inpatient.  H/O cutting his testicles because of depression. Thoughts of inhaling helium to end life. Last inpatient August 2016.  D/C on Seroquel 900 mg and amitriptyline 400 mg HS. Saw at [facility] and [name]. SSRI caused reactions. Tried nortriptyline, Haldol, Geodon, Seroquel, lithium, Prozac, Wellbutrin, Paxil, Klonopin, Ativan, Lexapro, Zoloft, abilify and Lamictal.  No H/O mania  or drug use.  We did GeneSight testing and results are in the chart.  Only antipsychotic medication that is unfavorable section is Saphris and Navane.  Most of the SSRIs are also unfavorable section.   ? ?Recent Results (from the past 2160 hour(s))  ?HgB A1c     Status: None  ? Collection Time: 02/08/22  1:47 PM  ?Result Value Ref Range  ? Hemoglobin A1C    ? HbA1c POC (<> result, manual entry)    ? HbA1c, POC (prediabetic range)    ? HbA1c, POC (controlled diabetic range) 6.1 0.0 - 7.0 %  ?CBC     Status: Abnormal  ? Collection Time: 02/08/22  2:12 PM  ?Result Value Ref Range  ? WBC 8.8 3.4 - 10.8 x10E3/uL  ? RBC 5.25 4.14 - 5.80 x10E6/uL  ? Hemoglobin 13.4 13.0 - 17.7 g/dL  ? Hematocrit 42.7 37.5 - 51.0 %  ? MCV 81 79 - 97 fL  ? MCH 25.5 (L) 26.6 - 33.0 pg  ? MCHC 31.4 (L) 31.5 - 35.7 g/dL  ? RDW 18.3 (H) 11.6 - 15.4 %  ? Platelets 226 150 - 450 x10E3/uL  ?  ?  ?Psychiatric Specialty Exam: ?Physical Exam  ?Review of Systems  ?Weight 203 lb (92.1 kg).There is no height or weight on file to calculate BMI.  ?General Appearance: NA  ?Eye Contact:  NA  ?Speech:  Slow  ?Volume:  Decreased  ?Mood:  Anxious  ?Affect:  NA  ?Thought Process:  Descriptions of Associations: Intact  ?Orientation:  Full (Time, Place, and Person)  ?Thought Content:  Paranoid Ideation and Rumination  ?Suicidal Thoughts:  No  ?Homicidal Thoughts:  No  ?Memory:  Immediate;   Fair ?Recent;   Fair ?Remote;   Fair  ?Judgement:  Fair  ?Insight:  Shallow  ?Psychomotor Activity:  NA  ?Concentration:  Concentration: Fair and Attention Span: Fair  ?Recall:  Fair  ?Fund of Knowledge:  Fair  ?Language:  Good  ?Akathisia:  No  ?Handed:  Right  ?AIMS (if indicated):     ?Assets:  Communication Skills ?Desire for Improvement ?Housing  ?ADL's:  Intact  ?Cognition:  WNL  ?Sleep:   8 hrs  ? ? ? ? ?Assessment and Plan: ?Schizoaffective disorder, bipolar type.  Generalized anxiety disorder.  Primary insomnia. ? ?I reviewed blood work results.  He is stable on his  current medication.  Encourage regular exercise and watch his calorie intake.  We will continue amitriptyline 200 mg at bedtime, Belsomra 10 mg to take as needed for insomnia and Seroquel 400 mg at bedtime.  Recommended to call us back if is any question or any concern.  Follow-up in 3 months. ? ?Collaboration of Care: Other provider involved in patient's care AEB notes are in the epic to review. ? ?Patient/Guardian was advised Release of Information must be obtained prior to any record release in order to collaborate their care with an outside provider. Patient/Guardian was advised if they have not already done so to contact the registration department to sign all necessary forms in order for [place] to release information regarding their care.  ? ?Consent: Patient/Guardian gives verbal consent for treatment and assignment of benefits for services provided during this visit. Patient/Guardian expressed understanding and agreed to proceed.   ? ?Follow Up Instructions: ? ?  ?I discussed the assessment and treatment plan with the patient. The patient was provided an opportunity to ask questions and all were answered. The patient agreed with the plan and demonstrated an understanding of the instructions. ?  ?The patient was advised to call back or seek an in-person evaluation if the symptoms worsen or if the condition fails to improve as anticipated. ? ?I provided 19 minutes of non-face-to-face time during this encounter. ? ? ?Kathlee Nations, MD  ?

## 2022-04-02 ENCOUNTER — Encounter: Payer: Medicare Other | Admitting: Gastroenterology

## 2022-04-03 ENCOUNTER — Encounter: Payer: Self-pay | Admitting: Gastroenterology

## 2022-04-03 ENCOUNTER — Ambulatory Visit (INDEPENDENT_AMBULATORY_CARE_PROVIDER_SITE_OTHER): Payer: Medicare Other | Admitting: Gastroenterology

## 2022-04-03 VITALS — HR 97 | Ht 69.0 in | Wt 203.2 lb

## 2022-04-03 DIAGNOSIS — Z86010 Personal history of colonic polyps: Secondary | ICD-10-CM

## 2022-04-03 NOTE — Progress Notes (Signed)
Review of pertinent gastrointestinal problems: ?1.  Personal history of precancerous colon polyps.  Colonoscopy 07/2018 9 subcentimeter polyps were removed.  These were mixed adenomas and sessile serrated polyps. ? ? ?HPI: ?This is a very pleasant 66 year old man ? ?Given his history of precancerous colon polyps he was scheduled for a surveillance colonoscopy 09/2021.  He checked in for the appointment in our endoscopy center and then he just disappeared.  We called his wife and she was not even aware that he was to have a colonoscopy.  She said his memory was failing.  She did not know where he was and so we ended up calling 911 and explained the situation to the police. ? ?Today he is completely alert and oriented x3.  He recalls the problems in our endoscopy center several months ago.  He was not aware that we called the police.  He said that his take on the whole episode was that his wife or possibly it was his girlfriend was not reliable as a care partner.  He will not use her again a second time. ? ?He has chronic constipation.  Sometimes it can be so bad that he will have to manually disimpact himself and at that time he will see some blood in the stool.  He takes 3 doses of MiraLAX every day and that is very effective for treating his constipation. ? ?His weight is overall stable ? ?He does admit to having some cognitive decline lately but he is completely alert and oriented x3 today.  He knows his med list very well.  He knows specific types of medicines that he is on and side effects he is having from them.  He is seeing someone for his memory difficulties but they do not seem to debilitate him very much. ? ?He has pale complexion.  CBC 6 weeks ago was normal ? ? ?ROS: complete GI ROS as described in HPI, all other review negative. ? ?Constitutional:  No unintentional weight loss ? ? ?Past Medical History:  ?Diagnosis Date  ? Anxiety   ? Bowel obstruction ([place])   ? Chronic insomnia 04/14/2020  ? Colon  polyps   ? Constipation   ? Depression   ? Diabetes mellitus without complication ([place])   ? Dyslipidemia   ? GERD (gastroesophageal reflux disease)   ? Hearing loss   ? History of echocardiogram   ? Echo 8/18: EF 50-55, normal wall motion, grade 1 diastolic dysfunction, mild MR  ? Hypertension   ? Memory difficulty 10/25/2017  ? Memory loss   ? MRSA carrier   ? Pneumonia   ? Schizoaffective disorder   ? Seizures ([place])   ? last 4-5 years ago per pt  ? Skin cancer   ? ? ?Past Surgical History:  ?Procedure Laterality Date  ? CARPAL TUNNEL RELEASE    ? COLONOSCOPY  12/17/2008  ? COLONOSCOPY  07/23/2018  ? Dr.Anastasia Tompson  ? ORIF ANKLE FRACTURE Left 11/26/2018  ? Procedure: OPEN REDUCTION INTERNAL FIXATION (ORIF) ANKLE FRACTURE;  Surgeon: Hiram Gash, MD;  Location: [place];  Service: Orthopedics;  Laterality: Left;  ? ORIF SHOULDER FRACTURE Right   ? Arthroplasty  ? self orchectomy Bilateral   ? SHOULDER CLOSED REDUCTION Left 09/16/2013  ? Procedure: CLOSED REDUCTION SHOULDER;  Surgeon: Mauri Pole, MD;  Location: [facility];  Service: Orthopedics;  Laterality: Left;  ? SHOULDER HEMI-ARTHROPLASTY Left 09/18/2013  ? Procedure: LEFT SHOULDER HEMI-ARTHROPLASTY;  Surgeon: Augustin Schooling, MD;  Location: [place];  Service: Orthopedics;  Laterality: Left;  ? TIBIA IM NAIL INSERTION Left 11/26/2018  ? Procedure: INTRAMEDULLARY (IM) NAIL TIBIAL;  Surgeon: Hiram Gash, MD;  Location: [place];  Service: Orthopedics;  Laterality: Left;  ? UPPER GASTROINTESTINAL ENDOSCOPY  07/23/2018  ? Dr.Serah Nicoletti  ? ? ?Current Outpatient Medications  ?Medication Instructions  ? amitriptyline (ELAVIL) 200 mg, Oral, Daily at bedtime  ? aspirin EC 81 mg, Oral, Daily, Swallow whole.  ? atorvastatin (LIPITOR) 40 MG tablet TAKE 1 TABLET BY MOUTH EVERY DAY  ? Belsomra 10 mg, Oral, Daily at bedtime  ? Lacosamide (VIMPAT) 150 mg, Oral, 2 times daily  ? metFORMIN (GLUCOPHAGE-XR) 1,000 mg, Oral, Nightly  ? metoprolol succinate (TOPROL-XL) 100 MG 24 hr tablet TAKE 2  TABLETS BY MOUTH AT BEDTIME *TAKE WITH OR IMMEDIATELY FOLLOWING A MEAL*  ? Multiple Vitamin (MULTIVITAMIN WITH MINERALS) TABS tablet 1 tablet, Oral, Daily  ? polyethylene glycol powder ([employer] PURELAX) powder TAKE 17 GRAMS 2 TIMES A DAY AS NEEDED FOR CONSTIPATION. MIX INTO 8 OF FLUID AND DRINK  ? QUEtiapine (SEROQUEL) 400 mg, Oral, Daily at bedtime  ? testosterone cypionate (DEPOTESTOSTERONE CYPIONATE) 200 MG/ML injection SMARTSIG:1 Milliliter(s) IM Every 2 Weeks  ? ? ?Allergies as of 04/03/2022 - Review Complete 04/03/2022  ?Allergen Reaction Noted  ? Codeine Itching and Nausea And Vomiting 01/20/2014  ? Sulfa antibiotics Nausea And Vomiting 01/08/2012  ? Serotonin reuptake inhibitors (ssris)  04/03/2022  ? Sulfasalazine Nausea And Vomiting 01/08/2012  ? ? ?Family History  ?Problem Relation Age of Onset  ? Stroke Mother   ?     Stroke in late 70's. Died in her 81's  ? Fibromyalgia Mother   ? Stroke Father   ?     Living at 96  ? Alcohol abuse Brother   ? Alcohol abuse Brother   ? Diabetes Brother   ? Colon cancer Neg Hx   ? Esophageal cancer Neg Hx   ? Stomach cancer Neg Hx   ? Rectal cancer Neg Hx   ? ? ?Social History  ? ?Socioeconomic History  ? Marital status: Single  ?  Spouse name: Not on file  ? Number of children: 0  ? Years of education: 29  ? Highest education level: Not on file  ?Occupational History  ? Occupation: disabled  ?Tobacco Use  ? Smoking status: Never  ?  Passive exposure: Never  ? Smokeless tobacco: Never  ?Vaping Use  ? Vaping Use: Never used  ?Substance and Sexual Activity  ? Alcohol use: No  ?  Alcohol/week: 0.0 standard drinks  ? Drug use: No  ? Sexual activity: Not Currently  ?  Partners: Female  ?  Birth control/protection: None  ?Other Topics Concern  ? Not on file  ?Social History Narrative  ? Patient does not drink caffeine.  ? Patient is left handed.  ? ?Social Determinants of Health  ? ?Financial Resource Strain: Not on file  ?Food Insecurity: Not on file  ?Transportation Needs: Not  on file  ?Physical Activity: Not on file  ?Stress: Not on file  ?Social Connections: Not on file  ?Intimate Partner Violence: Not on file  ? ? ? ?Physical Exam: ?Pulse 97   Ht '5\' 9"'$  (1.753 m) Comment: measured without shoes  Wt 203 lb 4 oz (92.2 kg)   BMI 30.01 kg/m?  ?Constitutional: generally well-appearing, pale complexion ?Psychiatric: alert and oriented x3 ?Abdomen: soft, nontender, nondistended, no obvious ascites, no peritoneal signs, normal bowel sounds ?No peripheral edema noted in  lower extremities ? ?Assessment and plan: ?66 y.o. male with Cologuard positive stool ? ?Previously documented to have a difficult airway intubation.  I recommended we proceed with colonoscopy at his soonest convenience and given his airway intubation difficulty in the past this will be safest to be done in the hospital outpatient setting rather than our ambulatory surgical center. ? ?I see no reason for further blood tests or imaging studies prior to then. ? ?Please see the "Patient Instructions" section for addition details about the plan. ? ?Owens Loffler, MD ?[name] ?04/03/2022, 2:35 PM ? ? ?Total time on date of encounter was 30 minutes (this included time spent preparing to see the patient reviewing records; obtaining and/or reviewing separately obtained history; performing a medically appropriate exam and/or evaluation; counseling and educating the patient and family if present; ordering medications, tests or procedures if applicable; and documenting clinical information in the health record). ? ? ? ? ? ?

## 2022-04-03 NOTE — Patient Instructions (Signed)
If you are age 68 or older, your body mass index should be between 23-30. Your Body mass index is 30.01 kg/m?Marland Kitchen If this is out of the aforementioned range listed, please consider follow up with your Primary Care Provider. ? ?If you are age 38 or younger, your body mass index should be between 19-25. Your Body mass index is 30.01 kg/m?Marland Kitchen If this is out of the aformentioned range listed, please consider follow up with your Primary Care Provider.  ? ?________________________________________________________ ? ?The [facility] providers would like to encourage you to use [facility] to communicate with providers for non-urgent requests or questions.  Due to long hold times on the telephone, sending your provider a message by [facility] may be a faster and more efficient way to get a response.  Please allow 48 business hours for a response.  Please remember that this is for non-urgent requests.  ?_______________________________________________________ ? ?You need to be scheduled for a colonoscopy at [facility].  We do not have our July schedule out at this time.  When the schedule becomes available, we will contact you. ? ?Thank you for entrusting me with your care and choosing [facility]. ? ?Dr Ardis Hughs ? ? ?

## 2022-04-16 ENCOUNTER — Other Ambulatory Visit: Payer: Self-pay

## 2022-04-16 ENCOUNTER — Telehealth: Payer: Self-pay

## 2022-04-16 DIAGNOSIS — Z860100 Personal history of colon polyps, unspecified: Secondary | ICD-10-CM

## 2022-04-16 NOTE — Telephone Encounter (Signed)
-----   Message from [place] sent at 04/03/2022  3:06 PM EDT ----- ?Regarding: Hospital Case ?Patient needs to have a colonoscopy at [facility] in July with Dr Ardis Hughs dx: hx of colon polyps, difficult intubation ? ?

## 2022-04-16 NOTE — Telephone Encounter (Signed)
Patient aware that he has been scheduled for colonoscopy at [facility] on 07-05-22 at 8:30am.  Patient prep instructions discussed with patient by phone.  Instructions printed and left at front desk for pick up.  Patient verbalized understanding and agreed to plan.  No further questions. ?

## 2022-04-18 NOTE — Progress Notes (Deleted)
No chief complaint on file.   HISTORY OF PRESENT ILLNESS:  04/18/22 ALL:  [name] returns for follow up for seizures. He continues lacosamide (now on generic) '150mg'$  BID.  He continues close follow up with PCP and psychiatry.    04/13/2021 ALL:  [name] is a 66 y.o. male here today for follow up for seizures. He continues Vimpat and tolerating well. No recent seizure activity. He continues close follow up with psychiatry and PCP. He reports sleeping well. Memory stable. No new or worsening concerns, today.   HISTORY (copied from Dr [name]' previous note)  [name] is a 66 year old left-handed white male with a history of schizoaffective disorder, and some memory issues.  He does have history of seizures but he has done quite well on Vimpat.  He has not had any further seizures.  He has a chronic issue with insomnia.  He has had neuropsychological testing that did not show an organic dementia, he was felt that he had executive function disorder that was in part related to depression, anxiety, and sleep problems.  The patient is also on multiple medications that are psychoactive including Vimpat, amitriptyline, Seroquel, and oxybutynin.  The patient claims that it takes about 2 hours for him to get to sleep even though he takes 200 mg of amitriptyline, 5 mg of Ambien, and 400 mg of Seroquel at night.  The patient returns for an evaluation.  He is currently not operating a motor vehicle but is planning on trying to get his driver's license back.   REVIEW OF SYSTEMS: Out of a complete 14 system review of symptoms, the patient complains only of the following symptoms,memory loss, insomnia and all other reviewed systems are negative.    ALLERGIES: Allergies  Allergen Reactions   Codeine Itching and Nausea And Vomiting   Sulfa Antibiotics Nausea And Vomiting   Serotonin Reuptake Inhibitors (Ssris)     Restless   Sulfasalazine Nausea And Vomiting     HOME MEDICATIONS: Outpatient  Medications Prior to Visit  Medication Sig Dispense Refill   amitriptyline (ELAVIL) 100 MG tablet Take 2 tablets (200 mg total) by mouth at bedtime. 60 tablet 2   aspirin EC 81 MG tablet Take 81 mg by mouth daily. Swallow whole.     atorvastatin (LIPITOR) 40 MG tablet TAKE 1 TABLET BY MOUTH EVERY DAY 90 tablet 1   Lacosamide (VIMPAT) 150 MG TABS Take 1 tablet (150 mg total) by mouth 2 (two) times daily. 60 tablet 5   metFORMIN (GLUCOPHAGE-XR) 500 MG 24 hr tablet Take 2 tablets (1,000 mg total) by mouth at bedtime. 60 tablet 3   metoprolol succinate (TOPROL-XL) 100 MG 24 hr tablet TAKE 2 TABLETS BY MOUTH AT BEDTIME *TAKE WITH OR IMMEDIATELY FOLLOWING A MEAL* 180 tablet 0   Multiple Vitamin (MULTIVITAMIN WITH MINERALS) TABS tablet Take 1 tablet by mouth daily.     polyethylene glycol powder ([facility] PURELAX) powder TAKE 17 GRAMS 2 TIMES A DAY AS NEEDED FOR CONSTIPATION. MIX INTO 8 OF FLUID AND DRINK (Patient taking differently: Take 17 g by mouth daily. Mix into 8 oz of fluid and drink) 510 g 1   QUEtiapine (SEROQUEL) 400 MG tablet Take 1 tablet (400 mg total) by mouth at bedtime. 30 tablet 2   Suvorexant (BELSOMRA) 10 MG TABS Take 10 mg by mouth at bedtime. 30 tablet 2   testosterone cypionate (DEPOTESTOSTERONE CYPIONATE) 200 MG/ML injection SMARTSIG:1 Milliliter(s) IM Every 2 Weeks     No facility-administered medications  prior to visit.     PAST MEDICAL HISTORY: Past Medical History:  Diagnosis Date   Anxiety    Bowel obstruction (HCC)    Chronic insomnia 04/14/2020   Colon polyps    Constipation    Depression    Diabetes mellitus without complication (HCC)    Dyslipidemia    GERD (gastroesophageal reflux disease)    Hearing loss    History of echocardiogram    Echo 8/18: EF 50-55, normal wall motion, grade 1 diastolic dysfunction, mild MR   Hypertension    Memory difficulty 10/25/2017   Memory loss    MRSA carrier    Pneumonia    Schizoaffective disorder    Seizures ([place])     last 4-5 years ago per pt   Skin cancer      PAST SURGICAL HISTORY: Past Surgical History:  Procedure Laterality Date   CARPAL TUNNEL RELEASE     COLONOSCOPY  12/17/2008   COLONOSCOPY  07/23/2018   Dr.Jacobs   ORIF ANKLE FRACTURE Left 11/26/2018   Procedure: OPEN REDUCTION INTERNAL FIXATION (ORIF) ANKLE FRACTURE;  Surgeon: Hiram Gash, MD;  Location: [facility];  Service: Orthopedics;  Laterality: Left;   ORIF SHOULDER FRACTURE Right    Arthroplasty   self orchectomy Bilateral    SHOULDER CLOSED REDUCTION Left 09/16/2013   Procedure: CLOSED REDUCTION SHOULDER;  Surgeon: Mauri Pole, MD;  Location: [facility];  Service: Orthopedics;  Laterality: Left;   SHOULDER HEMI-ARTHROPLASTY Left 09/18/2013   Procedure: LEFT SHOULDER HEMI-ARTHROPLASTY;  Surgeon: Augustin Schooling, MD;  Location: [facility];  Service: Orthopedics;  Laterality: Left;   TIBIA IM NAIL INSERTION Left 11/26/2018   Procedure: INTRAMEDULLARY (IM) NAIL TIBIAL;  Surgeon: Hiram Gash, MD;  Location: [facility];  Service: Orthopedics;  Laterality: Left;   UPPER GASTROINTESTINAL ENDOSCOPY  07/23/2018   Dr.Jacobs     FAMILY HISTORY: Family History  Problem Relation Age of Onset   Stroke Mother        Stroke in late 51's. Died in her 31's   Fibromyalgia Mother    Stroke Father        Living at 14   Alcohol abuse Brother    Alcohol abuse Brother    Diabetes Brother    Colon cancer Neg Hx    Esophageal cancer Neg Hx    Stomach cancer Neg Hx    Rectal cancer Neg Hx      SOCIAL HISTORY: Social History   Socioeconomic History   Marital status: Single    Spouse name: Not on file   Number of children: 0   Years of education: 18   Highest education level: Not on file  Occupational History   Occupation: disabled  Tobacco Use   Smoking status: Never    Passive exposure: Never   Smokeless tobacco: Never  Vaping Use   Vaping Use: Never used  Substance and Sexual Activity   Alcohol use: No    Alcohol/week: 0.0 standard  drinks   Drug use: No   Sexual activity: Not Currently    Partners: Female    Birth control/protection: None  Other Topics Concern   Not on file  Social History Narrative   Patient does not drink caffeine.   Patient is left handed.   Social Determinants of Health   Financial Resource Strain: Not on file  Food Insecurity: Not on file  Transportation Needs: Not on file  Physical Activity: Not on file  Stress: Not on file  Social Connections:  Not on file  Intimate Partner Violence: Not on file      PHYSICAL EXAM  There were no vitals filed for this visit.  There is no height or weight on file to calculate BMI.   Generalized: Well developed, in no acute distress  Cardiology: normal rate and rhythm, no murmur auscultated  Respiratory: clear to auscultation bilaterally    Neurological examination  Mentation: Alert oriented to time, place, history taking. Follows all commands speech and language fluent Cranial nerve II-XII: Pupils were equal round reactive to light. Extraocular movements were full, visual field were full on confrontational test. Facial sensation and strength were normal. Head turning and shoulder shrug  were normal and symmetric. Motor: The motor testing reveals 5 over 5 strength of all 4 extremities. Good symmetric motor tone is noted throughout.   Gait and station: Gait is normal.     DIAGNOSTIC DATA (LABS, IMAGING, TESTING) - I reviewed patient records, labs, notes, testing and imaging myself where available.  Lab Results  Component Value Date   WBC 8.8 02/08/2022   HGB 13.4 02/08/2022   HCT 42.7 02/08/2022   MCV 81 02/08/2022   PLT 226 02/08/2022      Component Value Date/Time   NA 132 (L) 10/26/2021 2127   NA 139 05/25/2021 1440   K 4.1 10/26/2021 2127   CL 99 10/26/2021 2127   CO2 23 10/26/2021 2127   GLUCOSE 110 (H) 10/26/2021 2127   BUN 19 10/26/2021 2127   BUN 12 05/25/2021 1440   CREATININE 0.96 10/26/2021 2127   CREATININE 0.87  02/18/2017 1413   CALCIUM 8.7 (L) 10/26/2021 2127   PROT 7.0 10/26/2021 2127   PROT 6.8 05/25/2021 1440   ALBUMIN 4.3 10/26/2021 2127   ALBUMIN 4.4 05/25/2021 1440   AST 17 10/26/2021 2127   ALT 15 10/26/2021 2127   ALKPHOS 98 10/26/2021 2127   BILITOT 1.0 10/26/2021 2127   BILITOT 0.3 05/25/2021 1440   GFRNONAA >60 10/26/2021 2127   GFRNONAA >89 02/18/2017 1413   GFRAA 93 03/17/2020 1409   GFRAA >89 02/18/2017 1413   Lab Results  Component Value Date   CHOL 104 05/25/2021   HDL 45 05/25/2021   LDLCALC 29 05/25/2021   TRIG 184 (H) 05/25/2021   CHOLHDL 2.3 05/25/2021   Lab Results  Component Value Date   HGBA1C 6.1 02/08/2022   Lab Results  Component Value Date   UGQBVQXI50 388 10/25/2017   Lab Results  Component Value Date   TSH 1.240 05/25/2021       10/07/2019   12:50 PM 05/15/2018    8:18 AM 10/25/2017   10:53 AM  MMSE - Mini Mental State Exam  Orientation to time '4 5 4  '$ Orientation to Place '5 4 5  '$ Registration '3 3 3  '$ Attention/ Calculation '3 3 2  '$ Recall '3 3 2  '$ Language- name 2 objects '2 2 2  '$ Language- repeat '1 1 1  '$ Language- follow 3 step command '3 3 3  '$ Language- read & follow direction '1 1 1  '$ Write a sentence '1 1 1  '$ Copy design '1 1 1  '$ Copy design-comments named 10 animals    Total score '27 27 25        '$ 02/08/2020    9:00 AM  Montreal Cognitive Assessment   Visuospatial/ Executive (0/5) 2  Naming (0/3) 3  Attention: Read list of digits (0/2) 2  Attention: Read list of letters (0/1) 1  Attention: Serial 7 subtraction starting at 100 (  0/3) 2  Language: Repeat phrase (0/2) 2  Language : Fluency (0/1) 1  Abstraction (0/2) 1  Delayed Recall (0/5) 3  Orientation (0/6) 5  Total 22  Adjusted Score (based on education) 7     ASSESSMENT AND PLAN  66 y.o. year old male  has a past medical history of Anxiety, Bowel obstruction ([place]), Chronic insomnia (04/14/2020), Colon polyps, Constipation, Depression, Diabetes mellitus without complication  ([place]), Dyslipidemia, GERD (gastroesophageal reflux disease), Hearing loss, History of echocardiogram, Hypertension, Memory difficulty (10/25/2017), Memory loss, MRSA carrier, Pneumonia, Schizoaffective disorder, Seizures ([place]), and Skin cancer. here with     No diagnosis found.  [name] reports doing well, today. No seizures. He is tolerating Vimpat. We will continue '150mg'$  twice daily. He is sleeping well and feels memory is stable. He will continue close follow up with PCP and psychiatry. Healthy lifestyle habits encouraged. He will follow up in 1 year, sooner if needed.   No orders of the defined types were placed in this encounter.    No orders of the defined types were placed in this encounter.    Debbora Presto, MSN, FNP-C 04/18/2022, 7:58 AM  [facility] [address] [place], [name] [address] [phone]

## 2022-04-18 NOTE — Patient Instructions (Incomplete)
Below is our plan: ? ?We will continue lacosamide '150mg'$  twice daily. ? ?Please make sure you are consistent with timing of seizure medication. I recommend annual visit with primary care provider (PCP) for complete physical and routine blood work. We will monitor vitamin D level. I recommend daily intake of vitamin D (400-800iu) and calcium (800-'1000mg'$ ) for bone health. Discuss Dexa screening with PCP.  ? ?According to [place] law, you can not drive unless you are seizure / syncope free for at least 6 months and under physician's care. ? ?Please maintain precautions. Do not participate in activities where a loss of awareness could harm you or someone else. No swimming alone, no tub bathing, no hot tubs, no driving, no operating motorized vehicles (cars, ATVs, motocycles, etc), lawnmowers, power tools or firearms. No standing at heights, such as rooftops, ladders or stairs. Avoid hot objects such as stoves, heaters, open fires. Wear a helmet when riding a bicycle, scooter, skateboard, etc. and avoid areas of traffic. Set your water heater to 120 degrees or less.  ? ? ?Please make sure you are staying well hydrated. I recommend 50-60 ounces daily. Well balanced diet and regular exercise encouraged. Consistent sleep schedule with 6-8 hours recommended.  ? ?Please continue follow up with care team as directed.  ? ?Follow up with me in 1 year  ? ?You may receive a survey regarding today's visit. I encourage you to leave honest feed back as I do use this information to improve patient care. Thank you for seeing me today!  ? ? ?

## 2022-04-19 ENCOUNTER — Ambulatory Visit: Payer: Medicare Other | Admitting: Family Medicine

## 2022-04-19 ENCOUNTER — Encounter: Payer: Self-pay | Admitting: Family Medicine

## 2022-04-19 DIAGNOSIS — G40909 Epilepsy, unspecified, not intractable, without status epilepticus: Secondary | ICD-10-CM

## 2022-04-19 DIAGNOSIS — F259 Schizoaffective disorder, unspecified: Secondary | ICD-10-CM

## 2022-05-08 ENCOUNTER — Telehealth: Payer: Self-pay | Admitting: Gastroenterology

## 2022-05-08 NOTE — Telephone Encounter (Signed)
Inbound call from patient seeking advice if there is any other dates or times open for his colon procedure at [facility] with Dr. Ardis Hughs. Patient is currently scheduled for 7/20.

## 2022-05-08 NOTE — Telephone Encounter (Signed)
I have advised that there are no sooner appts at this time.  I will call him if we have any cancellations. The pt has been advised of the information and verbalized understanding.

## 2022-05-10 NOTE — Telephone Encounter (Signed)
We have no sooner appts available at this time.  He has been advised to call and check for cancellations at his convenience.

## 2022-05-10 NOTE — Telephone Encounter (Signed)
Patient called to request a time change fr his procedure scheduled at the hospital.

## 2022-05-18 ENCOUNTER — Encounter: Payer: Self-pay | Admitting: Student

## 2022-05-18 ENCOUNTER — Other Ambulatory Visit: Payer: Self-pay

## 2022-05-18 ENCOUNTER — Ambulatory Visit (INDEPENDENT_AMBULATORY_CARE_PROVIDER_SITE_OTHER): Payer: Medicare Other | Admitting: Student

## 2022-05-18 DIAGNOSIS — E119 Type 2 diabetes mellitus without complications: Secondary | ICD-10-CM

## 2022-05-18 NOTE — Assessment & Plan Note (Signed)
>>  ASSESSMENT AND PLAN FOR NEW ONSET TYPE 2 DIABETES MELLITUS ([place]) WRITTEN ON 05/18/2022  4:36 PM BY Ronnald Ramp, SARAH, DO  No need to repeat HgbA1c today as it has been good (under 7) over past several checks. No need to check CBGs at home.  -return around August to check A1c

## 2022-05-18 NOTE — Assessment & Plan Note (Signed)
No need to repeat HgbA1c today as it has been good (under 7) over past several checks. No need to check CBGs at home.  -return around August to check A1c

## 2022-05-18 NOTE — Progress Notes (Unsigned)
    SUBJECTIVE:   CHIEF COMPLAINT / HPI:   T2DM Last HgbA1c on 02/08/22 of 6.1. Currently taking metformin 1 g at bedtime. No GI side effects d/t metformin. Is wondering if he needs to check his sugar at home. He like to eat PB&J but its sugar free jelly, and sugar free candies and frozen dinners (meat loaf, chicken and veggies). Doesn't eat many vegetables.   Health maintenance  Last colonoscopy in 2019, 9 polyps removed, some pre-cancerous with recommendation to repeat colonoscopy in 3 years. Has colonoscopy scheduled for July.  PERTINENT  PMH / PSH: T2DM  OBJECTIVE:   Vitals:   05/18/22 1557  BP: 129/75  Pulse: 81  SpO2: 97%     General: NAD, pleasant, able to participate in exam Cardiac: RRR, no murmurs. Respiratory: CTAB, normal effort, No wheezes, rales or rhonchi Abdomen: Bowel sounds present, nontender, nondistended, no hepatosplenomegaly. Extremities: no edema or cyanosis. Skin: warm and dry, no rashes noted Neuro: alert, no obvious focal deficits Psych: Normal affect and mood  ASSESSMENT/PLAN:   No problem-specific Assessment & Plan notes found for this encounter.     Dr. Precious Gilding, [facility]    {    This will disappear when note is signed, click to select method of visit    :1}

## 2022-05-18 NOTE — Patient Instructions (Addendum)
It was wonderful to see you today.  Please bring ALL of your medications with you to every visit.   Today we talked about:  [facility]  [employer] [facility] in [place], [place] at [phone]. Another option would be through [facility]; however, the latter would likely require a referral from a medical doctor. [facility] can be reached directly at [phone].   Please return in August for your next visit to check your hemoglobin A1c   Thank you for choosing [facility].   Please call [phone] with any questions about today's appointment.  Please be sure to schedule follow up at the front  desk before you leave today.   Dorris Singh, MD  Family Medicine

## 2022-05-24 ENCOUNTER — Telehealth: Payer: Self-pay | Admitting: Gastroenterology

## 2022-05-24 DIAGNOSIS — E119 Type 2 diabetes mellitus without complications: Secondary | ICD-10-CM | POA: Diagnosis not present

## 2022-05-24 DIAGNOSIS — H2513 Age-related nuclear cataract, bilateral: Secondary | ICD-10-CM | POA: Diagnosis not present

## 2022-05-24 NOTE — Telephone Encounter (Signed)
Patient wants to reschedule hospital procedure.  Please call and advise.  Thank you.  If you cannot reach him on the number in his chart, he asked that you please try [phone].

## 2022-05-25 ENCOUNTER — Telehealth: Payer: Self-pay | Admitting: Gastroenterology

## 2022-05-25 ENCOUNTER — Other Ambulatory Visit (HOSPITAL_COMMUNITY): Payer: Self-pay | Admitting: Psychiatry

## 2022-05-25 DIAGNOSIS — F250 Schizoaffective disorder, bipolar type: Secondary | ICD-10-CM

## 2022-05-25 NOTE — Telephone Encounter (Signed)
Left message on machine to call back at all available numbers

## 2022-05-25 NOTE — Telephone Encounter (Signed)
See alternate phone note  

## 2022-05-25 NOTE — Telephone Encounter (Signed)
Left message on machine to call back  

## 2022-05-25 NOTE — Telephone Encounter (Signed)
Patient is returning your call.  

## 2022-05-25 NOTE — Telephone Encounter (Signed)
Inbound call from patient requesting a call back to discuss rescheduling his procedure on 7/20 at [facility] with Dr. Ardis Hughs to another day. Please advise.

## 2022-05-28 NOTE — Telephone Encounter (Signed)
Left message on machine to call back  

## 2022-05-28 NOTE — Telephone Encounter (Signed)
I spoke with the pt and he would like to have an appt in the late afternoon. I have advised that Dr Ardis Hughs appts are in the AM with latest around 51.  He has agreed to leave the appt as is. He will call back if he changes his mind.

## 2022-05-29 ENCOUNTER — Other Ambulatory Visit: Payer: Self-pay | Admitting: Family Medicine

## 2022-05-29 NOTE — Telephone Encounter (Signed)
Reviewed pt chart. Last saw AL,NP 04/13/21. Has upcoming f/u scheduled for 07/03/22 (made on 05/25/22).   Checked drug registry. Last ref[facility] [phone]. Spoke w/ [place]. They are showing last refill was 03/21/2021. Rx was not transferred out.   I called pt to discuss.  He has only been taking Vimpat, '150mg'$  daily instead of twice daily. He states he was not aware to take twice daily. He will start taking twice daily.  [id number] [place] [place] Has not had any seizures since last visit. Only has 1 week of medication left. Worried about running out. Aware I will send to covering MD, Dr. PLeta Baptistto review/e-scribe if he approves. Will call back once I hear from him. He verbalized understanding.

## 2022-05-29 NOTE — Telephone Encounter (Signed)
Pt in the office (could not get anyone on the phone) needing refill of  Lacosamide (VIMPAT) 150 MG TABS [id number]

## 2022-05-30 MED ORDER — LACOSAMIDE 150 MG PO TABS: 1.0000 | ORAL_TABLET | Freq: Two times a day (BID) | ORAL | 1 refills | Status: DC

## 2022-05-30 NOTE — Telephone Encounter (Signed)
[id number] because pt is telling them he is out. Please advise

## 2022-06-20 ENCOUNTER — Encounter (HOSPITAL_COMMUNITY): Payer: Self-pay | Admitting: Psychiatry

## 2022-06-20 ENCOUNTER — Telehealth (HOSPITAL_BASED_OUTPATIENT_CLINIC_OR_DEPARTMENT_OTHER): Payer: Medicare Other | Admitting: Psychiatry

## 2022-06-20 DIAGNOSIS — F411 Generalized anxiety disorder: Secondary | ICD-10-CM

## 2022-06-20 DIAGNOSIS — F5101 Primary insomnia: Secondary | ICD-10-CM

## 2022-06-20 DIAGNOSIS — F250 Schizoaffective disorder, bipolar type: Secondary | ICD-10-CM

## 2022-06-20 MED ORDER — AMITRIPTYLINE HCL 100 MG PO TABS: 200.0000 mg | ORAL_TABLET | Freq: Every day | ORAL | 2 refills | Status: DC

## 2022-06-20 MED ORDER — QUETIAPINE FUMARATE 400 MG PO TABS: 400.0000 mg | ORAL_TABLET | Freq: Every day | ORAL | 2 refills | Status: DC

## 2022-06-20 MED ORDER — BELSOMRA 10 MG PO TABS: 10.0000 mg | ORAL_TABLET | Freq: Every day | ORAL | 2 refills | Status: DC

## 2022-06-20 NOTE — Progress Notes (Signed)
Virtual Visit via Telephone Note  I connected with [name] on 06/20/22 at  3:40 PM EDT by telephone and verified that I am speaking with the correct person using two identifiers.  Location: Patient: Home Provider: Home Office   I discussed the limitations, risks, security and privacy concerns of performing an evaluation and management service by telephone and the availability of in person appointments. I also discussed with the patient that there may be a patient responsible charge related to this service. The patient expressed understanding and agreed to proceed.   History of Present Illness: Patient is evaluated by phone session.  He is doing better and noticed his sleep is improved and he started to go outside for walking.  He denies any recent hallucination or any paranoia.  He does sometimes have ruminative thoughts but overall he feels the medicine working and helping him.  He admitted lately not checking his blood sugar because he is out of the supplies but he has plan to get supplies and call his physician for appointment.  He reported the girl who is living with him helps for the groceries and sometimes he go by himself by taking over with his improvement from the past.  His appetite is okay.  His weight is unchanged from the past.  He has no tremors, shakes or any EPS.  He denies any suicidal thoughts.  Past Psychiatric History:  H/O depression, paranoia, suicidal attempt, delusions and multiple inpatient.  H/O cutting testicles and thoughts of inhaling helium to end life. Last inpatient August 2016.  D/C on Seroquel 900 mg and amitriptyline 400 mg HS. Saw at [facility] and [name]. SSRI caused reactions. Tried nortriptyline, Haldol, Geodon, Seroquel, lithium, Prozac, Wellbutrin, Paxil, Klonopin, Ativan, Lexapro, Zoloft, abilify and Lamictal.  No H/O mania or drug use.  We did GeneSight testing and results are in the chart.  Only antipsychotic medication that is unfavorable section is  Saphris and Navane.  Most of the SSRIs are also unfavorable section.    Psychiatric Specialty Exam: Physical Exam  Review of Systems  Weight 206 lb (93.4 kg).There is no height or weight on file to calculate BMI.  General Appearance: NA  Eye Contact:  NA  Speech:  Slow  Volume:  Decreased  Mood:  Euthymic  Affect:  NA  Thought Process:  Goal Directed  Orientation:  Full (Time, Place, and Person)  Thought Content:  Rumination  Suicidal Thoughts:  No  Homicidal Thoughts:  No  Memory:  Immediate;   Fair Recent;   Fair Remote;   Fair  Judgement:  Fair  Insight:  Shallow  Psychomotor Activity:  NA  Concentration:  Concentration: Fair and Attention Span: Fair  Recall:  [employer] of Knowledge:  Fair  Language:  Good  Akathisia:  No  Handed:  Right  AIMS (if indicated):     Assets:  Communication Skills Desire for Improvement Housing  ADL's:  Intact  Cognition:  WNL  Sleep:   ok      Assessment and Plan: Schizoaffective disorder, bipolar type.  Generalized anxiety disorder.  Primary insomnia.  Patient doing better and stable on his current medication.  I encouraged to continue walking and watch his calorie intake.  He is going to call his primary care physician for appointment.  Continue amitriptyline 200 mg at bedtime, Belsomra 10 mg to take as needed for insomnia and Seroquel 400 mg at bedtime.  Recommended to call us back if is any question or any concern.  Follow-up in 3 months.  Follow Up Instructions:    I discussed the assessment and treatment plan with the patient. The patient was provided an opportunity to ask questions and all were answered. The patient agreed with the plan and demonstrated an understanding of the instructions.   The patient was advised to call back or seek an in-person evaluation if the symptoms worsen or if the condition fails to improve as anticipated.  Collaboration of Care: Primary Care Provider AEB notes are available in epic to  review.  Patient/Guardian was advised Release of Information must be obtained prior to any record release in order to collaborate their care with an outside provider. Patient/Guardian was advised if they have not already done so to contact the registration department to sign all necessary forms in order for [place] to release information regarding their care.   Consent: Patient/Guardian gives verbal consent for treatment and assignment of benefits for services provided during this visit. Patient/Guardian expressed understanding and agreed to proceed.    I provided 25 minutes of non-face-to-face time during this encounter.   Kathlee Nations, MD

## 2022-06-28 ENCOUNTER — Encounter (HOSPITAL_COMMUNITY): Payer: Self-pay | Admitting: Gastroenterology

## 2022-07-02 ENCOUNTER — Other Ambulatory Visit: Payer: Self-pay | Admitting: Family Medicine

## 2022-07-02 DIAGNOSIS — E785 Hyperlipidemia, unspecified: Secondary | ICD-10-CM

## 2022-07-02 NOTE — Progress Notes (Signed)
Chief Complaint  Patient presents with   Follow-up    Rm 2, alone. Here to f/u for sz. Pt reports no sz like activity since last OV. No missed dose.     HISTORY OF PRESENT ILLNESS:  07/03/22 ALL:  [name] returns for follow up for seizures. He continues lacosamide 150mg  BID. He is doing well. Tolerating meds. No seizure activity. He reports mood is stable. He is sleeping well. He continues close follow up with PCP and psychiatry.   04/13/2021 ALL:  [name] is a 66 y.o. male here today for follow up for seizures. He continues Vimpat and tolerating well. No recent seizure activity. He continues close follow up with psychiatry and PCP. He reports sleeping well. Memory stable. No new or worsening concerns, today.   HISTORY (copied from Dr Anne Hahn' previous note)  [name] is a 66 year old left-handed white male with a history of schizoaffective disorder, and some memory issues.  He does have history of seizures but he has done quite well on Vimpat.  He has not had any further seizures.  He has a chronic issue with insomnia.  He has had neuropsychological testing that did not show an organic dementia, he was felt that he had executive function disorder that was in part related to depression, anxiety, and sleep problems.  The patient is also on multiple medications that are psychoactive including Vimpat, amitriptyline, Seroquel, and oxybutynin.  The patient claims that it takes about 2 hours for him to get to sleep even though he takes 200 mg of amitriptyline, 5 mg of Ambien, and 400 mg of Seroquel at night.  The patient returns for an evaluation.  He is currently not operating a motor vehicle but is planning on trying to get his driver's license back.  REVIEW OF SYSTEMS: Out of a complete 14 system review of symptoms, the patient complains only of the following symptoms,memory loss, insomnia and all other reviewed systems are negative.  ALLERGIES: Allergies  Allergen Reactions   Codeine  Itching and Nausea And Vomiting   Sulfa Antibiotics Nausea And Vomiting   Serotonin Reuptake Inhibitors (Ssris)     Restless   Sulfasalazine Nausea And Vomiting    HOME MEDICATIONS: Outpatient Medications Prior to Visit  Medication Sig Dispense Refill   amitriptyline (ELAVIL) 100 MG tablet Take 2 tablets (200 mg total) by mouth at bedtime. 60 tablet 2   aspirin EC 81 MG tablet Take 81 mg by mouth at bedtime. Swallow whole.     atorvastatin (LIPITOR) 40 MG tablet TAKE 1 TABLET BY MOUTH EVERY DAY 90 tablet 1   Lacosamide (VIMPAT) 150 MG TABS Take 1 tablet (150 mg total) by mouth 2 (two) times daily. Must keep follow up 07/03/22 for ongoing refills (Patient taking differently: Take 150 mg by mouth 2 (two) times daily. Must keep follow up 07/03/22 for ongoing refills) 60 tablet 1   metFORMIN (GLUCOPHAGE-XR) 500 MG 24 hr tablet Take 2 tablets (1,000 mg total) by mouth at bedtime. (Patient taking differently: Take 1,000 mg by mouth at bedtime.) 60 tablet 3   metoprolol succinate (TOPROL-XL) 100 MG 24 hr tablet TAKE 2 TABLETS BY MOUTH AT BEDTIME *TAKE WITH OR IMMEDIATELY FOLLOWING A MEAL* 180 tablet 0   Multiple Vitamin (MULTIVITAMIN WITH MINERALS) TABS tablet Take 1 tablet by mouth daily.     polyethylene glycol powder ([employer] PURELAX) powder TAKE 17 GRAMS 2 TIMES A DAY AS NEEDED FOR CONSTIPATION. MIX INTO 8 OF FLUID AND DRINK (Patient taking  differently: Take 17 g by mouth daily. Mix into 8 oz of fluid and drink) 510 g 1   QUEtiapine (SEROQUEL) 400 MG tablet Take 1 tablet (400 mg total) by mouth at bedtime. 30 tablet 2   Suvorexant (BELSOMRA) 10 MG TABS Take 10 mg by mouth at bedtime. 30 tablet 2   testosterone cypionate (DEPOTESTOSTERONE CYPIONATE) 200 MG/ML injection Inject 200 mg into the skin every 14 (fourteen) days.     No facility-administered medications prior to visit.     PAST MEDICAL HISTORY: Past Medical History:  Diagnosis Date   Anxiety    Bowel obstruction (HCC)    Chronic  insomnia 04/14/2020   Colon polyps    Constipation    Depression    Diabetes mellitus without complication (HCC)    Dyslipidemia    GERD (gastroesophageal reflux disease)    Hearing loss    History of echocardiogram    Echo 8/18: EF 50-55, normal wall motion, grade 1 diastolic dysfunction, mild MR   Hypertension    Memory difficulty 10/25/2017   Memory loss    MRSA carrier    Pneumonia    Schizoaffective disorder    Seizures (HCC)    last 4-5 years ago per pt   Skin cancer      PAST SURGICAL HISTORY: Past Surgical History:  Procedure Laterality Date   CARPAL TUNNEL RELEASE     COLONOSCOPY  12/17/2008   COLONOSCOPY  07/23/2018   Dr.Jacobs   ORIF ANKLE FRACTURE Left 11/26/2018   Procedure: OPEN REDUCTION INTERNAL FIXATION (ORIF) ANKLE FRACTURE;  Surgeon: Bjorn Pippin, MD;  Location: [facility] OR;  Service: Orthopedics;  Laterality: Left;   ORIF SHOULDER FRACTURE Right    Arthroplasty   self orchectomy Bilateral    SHOULDER CLOSED REDUCTION Left 09/16/2013   Procedure: CLOSED REDUCTION SHOULDER;  Surgeon: Shelda Pal, MD;  Location: [facility] ORS;  Service: Orthopedics;  Laterality: Left;   SHOULDER HEMI-ARTHROPLASTY Left 09/18/2013   Procedure: LEFT SHOULDER HEMI-ARTHROPLASTY;  Surgeon: Verlee Rossetti, MD;  Location: [facility] OR;  Service: Orthopedics;  Laterality: Left;   TIBIA IM NAIL INSERTION Left 11/26/2018   Procedure: INTRAMEDULLARY (IM) NAIL TIBIAL;  Surgeon: Bjorn Pippin, MD;  Location: [facility] OR;  Service: Orthopedics;  Laterality: Left;   UPPER GASTROINTESTINAL ENDOSCOPY  07/23/2018   Dr.Jacobs     FAMILY HISTORY: Family History  Problem Relation Age of Onset   Stroke Mother        Stroke in late 32's. Died in her 59's   Fibromyalgia Mother    Stroke Father        Living at 57   Alcohol abuse Brother    Alcohol abuse Brother    Diabetes Brother    Colon cancer Neg Hx    Esophageal cancer Neg Hx    Stomach cancer Neg Hx    Rectal cancer Neg Hx      SOCIAL  HISTORY: Social History   Socioeconomic History   Marital status: Single    Spouse name: Not on file   Number of children: 0   Years of education: 18   Highest education level: Not on file  Occupational History   Occupation: disabled  Tobacco Use   Smoking status: Never    Passive exposure: Never   Smokeless tobacco: Never  Vaping Use   Vaping Use: Never used  Substance and Sexual Activity   Alcohol use: No    Alcohol/week: 0.0 standard drinks of alcohol   Drug use: No  Sexual activity: Not Currently    Partners: Female    Birth control/protection: None  Other Topics Concern   Not on file  Social History Narrative   Patient does not drink caffeine.   Patient is left handed.   Social Determinants of Health   Financial Resource Strain: Low Risk  (08/04/2018)   Overall Financial Resource Strain (CARDIA)    Difficulty of Paying Living Expenses: Not hard at all  Food Insecurity: No Food Insecurity (08/04/2018)   Hunger Vital Sign    Worried About Running Out of Food in the Last Year: Never true    Ran Out of Food in the Last Year: Never true  Transportation Needs: No Transportation Needs (08/04/2018)   PRAPARE - Administrator, Civil Service (Medical): No    Lack of Transportation (Non-Medical): No  Physical Activity: Inactive (08/04/2018)   Exercise Vital Sign    Days of Exercise per Week: 0 days    Minutes of Exercise per Session: 0 min  Stress: No Stress Concern Present (08/04/2018)   [employer] of Occupational Health - Occupational Stress Questionnaire    Feeling of Stress : Only a little  Social Connections: Unknown (08/04/2018)   Social Connection and Isolation Panel [NHANES]    Frequency of Communication with Friends and Family: More than three times a week    Frequency of Social Gatherings with Friends and Family: More than three times a week    Attends Religious Services: Never    Database administrator or Organizations: No    Attends Tax inspector Meetings: Never    Marital Status: Not on file  Intimate Partner Violence: Not on file      PHYSICAL EXAM  Vitals:   07/03/22 0753  BP: 132/82  Pulse: 92  Weight: 213 lb (96.6 kg)  Height: 5\' 9"  (1.753 m)    Body mass index is 31.45 kg/m.   Generalized: Well developed, in no acute distress  Cardiology: normal rate and rhythm, no murmur auscultated  Respiratory: clear to auscultation bilaterally    Neurological examination  Mentation: Alert oriented to time, place, history taking. Follows all commands speech and language fluent Cranial nerve II-XII: Pupils were equal round reactive to light. Extraocular movements were full, visual field were full on confrontational test. Facial sensation and strength were normal. Head turning and shoulder shrug  were normal and symmetric. Motor: The motor testing reveals 5 over 5 strength of all 4 extremities. Good symmetric motor tone is noted throughout.   Gait and station: Gait is normal.     DIAGNOSTIC DATA (LABS, IMAGING, TESTING) - I reviewed patient records, labs, notes, testing and imaging myself where available.  Lab Results  Component Value Date   WBC 8.8 02/08/2022   HGB 13.4 02/08/2022   HCT 42.7 02/08/2022   MCV 81 02/08/2022   PLT 226 02/08/2022      Component Value Date/Time   NA 132 (L) 10/26/2021 2127   NA 139 05/25/2021 1440   K 4.1 10/26/2021 2127   CL 99 10/26/2021 2127   CO2 23 10/26/2021 2127   GLUCOSE 110 (H) 10/26/2021 2127   BUN 19 10/26/2021 2127   BUN 12 05/25/2021 1440   CREATININE 0.96 10/26/2021 2127   CREATININE 0.87 02/18/2017 1413   CALCIUM 8.7 (L) 10/26/2021 2127   PROT 7.0 10/26/2021 2127   PROT 6.8 05/25/2021 1440   ALBUMIN 4.3 10/26/2021 2127   ALBUMIN 4.4 05/25/2021 1440   AST 17 10/26/2021 2127  ALT 15 10/26/2021 2127   ALKPHOS 98 10/26/2021 2127   BILITOT 1.0 10/26/2021 2127   BILITOT 0.3 05/25/2021 1440   GFRNONAA >60 10/26/2021 2127   GFRNONAA >89 02/18/2017  1413   GFRAA 93 03/17/2020 1409   GFRAA >89 02/18/2017 1413   Lab Results  Component Value Date   CHOL 104 05/25/2021   HDL 45 05/25/2021   LDLCALC 29 05/25/2021   TRIG 184 (H) 05/25/2021   CHOLHDL 2.3 05/25/2021   Lab Results  Component Value Date   HGBA1C 6.1 02/08/2022   Lab Results  Component Value Date   VITAMINB12 422 10/25/2017   Lab Results  Component Value Date   TSH 1.240 05/25/2021       10/07/2019   12:50 PM 05/15/2018    8:18 AM 10/25/2017   10:53 AM  MMSE - Mini Mental State Exam  Orientation to time 4 5 4   Orientation to Place 5 4 5   Registration 3 3 3   Attention/ Calculation 3 3 2   Recall 3 3 2   Language- name 2 objects 2 2 2   Language- repeat 1 1 1   Language- follow 3 step command 3 3 3   Language- read & follow direction 1 1 1   Write a sentence 1 1 1   Copy design 1 1 1   Copy design-comments named 10 animals    Total score 27 27 25         02/08/2020    9:00 AM  Montreal Cognitive Assessment   Visuospatial/ Executive (0/5) 2  Naming (0/3) 3  Attention: Read list of digits (0/2) 2  Attention: Read list of letters (0/1) 1  Attention: Serial 7 subtraction starting at 100 (0/3) 2  Language: Repeat phrase (0/2) 2  Language : Fluency (0/1) 1  Abstraction (0/2) 1  Delayed Recall (0/5) 3  Orientation (0/6) 5  Total 22  Adjusted Score (based on education) 22     ASSESSMENT AND PLAN  66 y.o. year old male  has a past medical history of Anxiety, Bowel obstruction (HCC), Chronic insomnia (04/14/2020), Colon polyps, Constipation, Depression, Diabetes mellitus without complication (HCC), Dyslipidemia, GERD (gastroesophageal reflux disease), Hearing loss, History of echocardiogram, Hypertension, Memory difficulty (10/25/2017), Memory loss, MRSA carrier, Pneumonia, Schizoaffective disorder, Seizures (HCC), and Skin cancer. here with     Seizure disorder [facility])  [name] reports doing well, today. No seizures. He is tolerating lacosamide. We will  continue 150mg  twice daily. He is sleeping well and feels mood is stable. He will continue close follow up with PCP and psychiatry. Healthy lifestyle habits encouraged. He will follow up in 1 year, sooner if needed.   No orders of the defined types were placed in this encounter.    No orders of the defined types were placed in this encounter.    Shawnie Dapper, MSN, FNP-C 07/03/2022, 8:17 AM  [facility] [address] [place], [place] [address] [phone]

## 2022-07-02 NOTE — Patient Instructions (Signed)
Below is our plan:  We will continue lacosamide '150mg'$  twice daily.   Please make sure you are consistent with timing of seizure medication. I recommend annual visit with primary care provider (PCP) for complete physical and routine blood work. I recommend daily intake of vitamin D (400-800iu) and calcium (800-'1000mg'$ ) for bone health. Discuss Dexa screening with PCP.   According to [place] law, you can not drive unless you are seizure / syncope free for at least 6 months and under physician's care.  Please maintain precautions. Do not participate in activities where a loss of awareness could harm you or someone else. No swimming alone, no tub bathing, no hot tubs, no driving, no operating motorized vehicles (cars, ATVs, motocycles, etc), lawnmowers, power tools or firearms. No standing at heights, such as rooftops, ladders or stairs. Avoid hot objects such as stoves, heaters, open fires. Wear a helmet when riding a bicycle, scooter, skateboard, etc. and avoid areas of traffic. Set your water heater to 120 degrees or less.   Please make sure you are staying well hydrated. I recommend 50-60 ounces daily. Well balanced diet and regular exercise encouraged. Consistent sleep schedule with 6-8 hours recommended.   Please continue follow up with care team as directed.   Follow up with me in 1 year   You may receive a survey regarding today's visit. I encourage you to leave honest feed back as I do use this information to improve patient care. Thank you for seeing me today!

## 2022-07-03 ENCOUNTER — Encounter: Payer: Self-pay | Admitting: Family Medicine

## 2022-07-03 ENCOUNTER — Ambulatory Visit (INDEPENDENT_AMBULATORY_CARE_PROVIDER_SITE_OTHER): Payer: Medicare Other | Admitting: Family Medicine

## 2022-07-03 VITALS — BP 132/82 | HR 92 | Ht 69.0 in | Wt 213.0 lb

## 2022-07-03 DIAGNOSIS — G40909 Epilepsy, unspecified, not intractable, without status epilepticus: Secondary | ICD-10-CM

## 2022-07-03 MED ORDER — LACOSAMIDE 150 MG PO TABS: 1.0000 | ORAL_TABLET | Freq: Two times a day (BID) | ORAL | 1 refills | Status: DC

## 2022-07-05 ENCOUNTER — Ambulatory Visit (HOSPITAL_COMMUNITY)
Admission: RE | Admit: 2022-07-05 | Discharge: 2022-07-05 | Disposition: A | Discharge status: Home / Self Care | Payer: Medicare Other | Attending: Gastroenterology | Admitting: Gastroenterology

## 2022-07-05 ENCOUNTER — Ambulatory Visit (HOSPITAL_COMMUNITY): Payer: Medicare Other | Admitting: Anesthesiology

## 2022-07-05 ENCOUNTER — Telehealth: Payer: Self-pay

## 2022-07-05 ENCOUNTER — Encounter (HOSPITAL_COMMUNITY): Payer: Self-pay | Admitting: Gastroenterology

## 2022-07-05 ENCOUNTER — Ambulatory Visit (HOSPITAL_BASED_OUTPATIENT_CLINIC_OR_DEPARTMENT_OTHER): Payer: Medicare Other | Admitting: Anesthesiology

## 2022-07-05 ENCOUNTER — Other Ambulatory Visit: Payer: Self-pay

## 2022-07-05 ENCOUNTER — Encounter (HOSPITAL_COMMUNITY)
Admission: RE | Disposition: A | Discharge status: Home / Self Care | Payer: Self-pay | Source: Home / Self Care | Attending: Gastroenterology

## 2022-07-05 DIAGNOSIS — R569 Unspecified convulsions: Secondary | ICD-10-CM | POA: Insufficient documentation

## 2022-07-05 DIAGNOSIS — Z7984 Long term (current) use of oral hypoglycemic drugs: Secondary | ICD-10-CM | POA: Diagnosis not present

## 2022-07-05 DIAGNOSIS — I10 Essential (primary) hypertension: Secondary | ICD-10-CM

## 2022-07-05 DIAGNOSIS — Z86010 Personal history of colonic polyps: Secondary | ICD-10-CM | POA: Diagnosis not present

## 2022-07-05 DIAGNOSIS — K219 Gastro-esophageal reflux disease without esophagitis: Secondary | ICD-10-CM | POA: Insufficient documentation

## 2022-07-05 DIAGNOSIS — Z09 Encounter for follow-up examination after completed treatment for conditions other than malignant neoplasm: Secondary | ICD-10-CM

## 2022-07-05 DIAGNOSIS — E119 Type 2 diabetes mellitus without complications: Secondary | ICD-10-CM | POA: Diagnosis not present

## 2022-07-05 DIAGNOSIS — G4730 Sleep apnea, unspecified: Secondary | ICD-10-CM | POA: Insufficient documentation

## 2022-07-05 DIAGNOSIS — Z8673 Personal history of transient ischemic attack (TIA), and cerebral infarction without residual deficits: Secondary | ICD-10-CM | POA: Insufficient documentation

## 2022-07-05 DIAGNOSIS — Z1211 Encounter for screening for malignant neoplasm of colon: Secondary | ICD-10-CM | POA: Diagnosis not present

## 2022-07-05 HISTORY — PX: COLONOSCOPY WITH PROPOFOL: SHX5780

## 2022-07-05 LAB — GLUCOSE, CAPILLARY: Glucose-Capillary: 110 mg/dL — ABNORMAL HIGH (ref 70–99)

## 2022-07-05 SURGERY — COLONOSCOPY WITH PROPOFOL
Anesthesia: Monitor Anesthesia Care

## 2022-07-05 MED ORDER — DEXMEDETOMIDINE HCL IN NACL 80 MCG/20ML IV SOLN
INTRAVENOUS | Status: AC
  Filled 2022-07-05: qty 20

## 2022-07-05 MED ORDER — PROPOFOL 500 MG/50ML IV EMUL
INTRAVENOUS | Status: DC | PRN
  Administered 2022-07-05 (×2): 20 mg via INTRAVENOUS
  Administered 2022-07-05: 140 ug/kg/min via INTRAVENOUS

## 2022-07-05 MED ORDER — SODIUM CHLORIDE 0.9 % IV SOLN: INTRAVENOUS | Status: DC

## 2022-07-05 MED ORDER — PROPOFOL 1000 MG/100ML IV EMUL
INTRAVENOUS | Status: AC
  Filled 2022-07-05: qty 100

## 2022-07-05 MED ORDER — LACTATED RINGERS IV SOLN
INTRAVENOUS | Status: AC | PRN
  Administered 2022-07-05: 1000 mL via INTRAVENOUS

## 2022-07-05 MED ORDER — DEXMEDETOMIDINE (PRECEDEX) IN NS 20 MCG/5ML (4 MCG/ML) IV SYRINGE
PREFILLED_SYRINGE | INTRAVENOUS | Status: DC | PRN
  Administered 2022-07-05: 8 ug via INTRAVENOUS

## 2022-07-05 SURGICAL SUPPLY — 22 items

## 2022-07-05 NOTE — Anesthesia Preprocedure Evaluation (Addendum)
Anesthesia Evaluation  Patient identified by MRN, date of birth, ID band Patient awake    Reviewed: Allergy & Precautions, NPO status , Patient's Chart, lab work & pertinent test results  Airway Mallampati: III  TM Distance: >3 FB Neck ROM: Full    Dental  (+) Teeth Intact, Dental Advisory Given   Pulmonary sleep apnea ,    breath sounds clear to auscultation       Cardiovascular hypertension, Pt. on home beta blockers  Rhythm:Regular Rate:Normal     Neuro/Psych Seizures -,  PSYCHIATRIC DISORDERS Anxiety Depression Schizophrenia TIA Neuromuscular disease    GI/Hepatic Neg liver ROS, GERD  ,  Endo/Other  diabetes, Type 2, Oral Hypoglycemic Agents  Renal/GU negative Renal ROS     Musculoskeletal   Abdominal Normal abdominal exam  (+)   Peds  Hematology   Anesthesia Other Findings   Reproductive/Obstetrics                           Anesthesia Physical Anesthesia Plan  ASA: 2  Anesthesia Plan: MAC   Post-op Pain Management:    Induction: Intravenous  PONV Risk Score and Plan: 0 and Propofol infusion  Airway Management Planned: Natural Airway and Simple Face Mask  Additional Equipment: None  Intra-op Plan:   Post-operative Plan:   Informed Consent: I have reviewed the patients History and Physical, chart, labs and discussed the procedure including the risks, benefits and alternatives for the proposed anesthesia with the patient or authorized representative who has indicated his/her understanding and acceptance.       Plan Discussed with: CRNA  Anesthesia Plan Comments:        Anesthesia Quick Evaluation

## 2022-07-05 NOTE — Op Note (Signed)
[facility] Patient Name: [name] Procedure Date: 07/05/2022 MRN: [id number] Attending MD: Milus Banister , MD Date of Birth: [birth year] CSN: [id number] Age: 66 Admit Type: Outpatient Procedure:                Colonoscopy Indications:              High risk colon cancer surveillance: Personal                            history of colonic polyps; Colonoscopy 07/2018 9                            subcentimeter polyps were removed. These were                            mixed adenomas and sessile serrated polyps Providers:                Milus Banister, MD, Kary Kos RN, RN, Cherylynn Ridges, Technician, Courtney Heys. [place], CRNA Referring MD:              Medicines:                Monitored Anesthesia Care Complications:            No immediate complications. Estimated blood loss:                            None. Estimated Blood Loss:     Estimated blood loss: none. Procedure:                Pre-Anesthesia Assessment:                           - Prior to the procedure, a History and Physical                            was performed, and patient medications and                            allergies were reviewed. The patient's tolerance of                            previous anesthesia was also reviewed. The risks                            and benefits of the procedure and the sedation                            options and risks were discussed with the patient.                            All questions were answered, and informed consent  was obtained. Prior Anticoagulants: The patient has                            taken no previous anticoagulant or antiplatelet                            agents. ASA Grade Assessment: III - A patient with                            severe systemic disease. After reviewing the risks                            and benefits, the patient was deemed in                             satisfactory condition to undergo the procedure.                           After obtaining informed consent, the colonoscope                            was passed under direct vision. Throughout the                            procedure, the patient's blood pressure, pulse, and                            oxygen saturations were monitored continuously. The                            CF-HQ190L ([id number] Olympus colonoscope was                            introduced through the anus with the intention of                            advancing to the cecum. The scope was advanced to                            the splenic flexure before the procedure was                            aborted. Medications were given. The colonoscopy                            was performed without difficulty. The patient                            tolerated the procedure well. The quality of the                            bowel preparation was poor. Scope In: Scope Out: Findings:      There was a large amount of very thick liquid stool  throughout the       visualized colon (to the splenix flexure) which I was unable to clear       with lavage and suction.      The exam was otherwise without abnormality on direct and retroflexion       views. Impression:               - THIS WAS AN INCOMPLETE EXAMINATION DUE TO POOR                            PREP Moderate Sedation:      Not Applicable - Patient had care per Anesthesia. Recommendation:           - Patient has a contact number available for                            emergencies. The signs and symptoms of potential                            delayed complications were discussed with the                            patient. Return to normal activities tomorrow.                            Written discharge instructions were provided to the                            patient.                           - Resume previous diet.                           - Continue present  medications.                           - Dr. Ardis Hughs' office will contact you to reschedule                            the colonoscopy using "double prep" protocol, to be                            done again at [facility] hospital. Procedure Code(s):        --- Professional ---                           [phone], 53, Colonoscopy, flexible; diagnostic,                            including collection of specimen(s) by brushing or                            washing, when performed (separate procedure) Diagnosis Code(s):        --- Professional ---  Z86.010, Personal history of colonic polyps CPT copyright 2019 American Medical Association. All rights reserved. The codes documented in this report are preliminary and upon coder review may  be revised to meet current compliance requirements. Milus Banister, MD 07/05/2022 9:04:15 AM This report has been signed electronically. Number of Addenda: 0

## 2022-07-05 NOTE — Anesthesia Postprocedure Evaluation (Signed)
Anesthesia Post Note  Patient: [name]  Procedure(s) Performed: COLONOSCOPY WITH PROPOFOL     Patient location during evaluation: PACU Anesthesia Type: MAC Level of consciousness: awake and alert Pain management: pain level controlled Vital Signs Assessment: post-procedure vital signs reviewed and stable Respiratory status: spontaneous breathing, nonlabored ventilation, respiratory function stable and patient connected to nasal cannula oxygen Cardiovascular status: stable and blood pressure returned to baseline Postop Assessment: no apparent nausea or vomiting Anesthetic complications: no   No notable events documented.  Last Vitals:  Vitals:   07/05/22 0921 07/05/22 0932  BP: (!) 146/86 (!) 162/95  Pulse: 99 99  Resp: (!) 23 17  Temp:    SpO2: 94% 95%    Last Pain:  Vitals:   07/05/22 0932  TempSrc:   PainSc: 0-No pain                 Effie Berkshire

## 2022-07-05 NOTE — Transfer of Care (Signed)
Immediate Anesthesia Transfer of Care Note  Patient: [name]  Procedure(s) Performed: COLONOSCOPY WITH PROPOFOL  Patient Location: PACU  Anesthesia Type:MAC  Level of Consciousness: awake, alert , oriented and patient cooperative  Airway & Oxygen Therapy: Patient Spontanous Breathing and Patient connected to face mask oxygen  Post-op Assessment: Report given to RN, Post -op Vital signs reviewed and stable and Patient moving all extremities  Post vital signs: Reviewed and stable  Last Vitals:  Vitals Value Taken Time  BP 141/87 07/05/22 0903  Temp    Pulse 88 07/05/22 0904  Resp 18 07/05/22 0904  SpO2 100 % 07/05/22 0904  Vitals shown include unvalidated device data.  Last Pain:  Vitals:   07/05/22 0749  TempSrc: Temporal  PainSc: 0-No pain         Complications: No notable events documented.

## 2022-07-05 NOTE — Discharge Instructions (Signed)
YOU HAD AN ENDOSCOPIC PROCEDURE TODAY: Refer to the procedure report and other information in the discharge instructions given to you for any specific questions about what was found during the examination. If this information does not answer your questions, please call [place] office at [phone] to clarify.  ° °YOU SHOULD EXPECT: Some feelings of bloating in the abdomen. Passage of more gas than usual. Walking can help get rid of the air that was put into your GI tract during the procedure and reduce the bloating. If you had a lower endoscopy (such as a colonoscopy or flexible sigmoidoscopy) you may notice spotting of blood in your stool or on the toilet paper. Some abdominal soreness may be present for a day or two, also. ° °DIET: Your first meal following the procedure should be a light meal and then it is ok to progress to your normal diet. A half-sandwich or bowl of soup is an example of a good first meal. Heavy or fried foods are harder to digest and may make you feel nauseous or bloated. Drink plenty of fluids but you should avoid alcoholic beverages for 24 hours. If you had a esophageal dilation, please see attached instructions for diet.   ° °ACTIVITY: Your care partner should take you home directly after the procedure. You should plan to take it easy, moving slowly for the rest of the day. You can resume normal activity the day after the procedure however YOU SHOULD NOT DRIVE, use power tools, machinery or perform tasks that involve climbing or major physical exertion for 24 hours (because of the sedation medicines used during the test).  ° °SYMPTOMS TO REPORT IMMEDIATELY: °A gastroenterologist can be reached at any hour. Please call [phone]  for any of the following symptoms:  °Following lower endoscopy (colonoscopy, flexible sigmoidoscopy) °Excessive amounts of blood in the stool  °Significant tenderness, worsening of abdominal pains  °Swelling of the abdomen that is new, acute  °Fever of 100° or  higher  °Following upper endoscopy (EGD, EUS, ERCP, esophageal dilation) °Vomiting of blood or coffee ground material  °New, significant abdominal pain  °New, significant chest pain or pain under the shoulder blades  °Painful or persistently difficult swallowing  °New shortness of breath  °Black, tarry-looking or red, bloody stools ° °FOLLOW UP:  °If any biopsies were taken you will be contacted by phone or by letter within the next 1-3 weeks. Call [phone]  if you have not heard about the biopsies in 3 weeks.  °Please also call with any specific questions about appointments or follow up tests. ° °

## 2022-07-05 NOTE — Telephone Encounter (Signed)
-----   Message from Milus Banister, MD sent at 07/05/2022  9:05 AM EDT ----- Poor prep today.  Can you contact him to reschedule another attempt with double prep protocol, at [facility].  Thanks

## 2022-07-05 NOTE — H&P (Signed)
HPI: This is a man with h/o polyps, difficult airway intubation   Personal history of precancerous colon polyps.  Colonoscopy 07/2018 9 subcentimeter polyps were removed.  These were mixed adenomas and sessile serrated polyps.   ROS: complete GI ROS as described in HPI, all other review negative.  Constitutional:  No unintentional weight loss   Past Medical History:  Diagnosis Date   Anxiety    Bowel obstruction (HCC)    Chronic insomnia 04/14/2020   Colon polyps    Constipation    Depression    Diabetes mellitus without complication (HCC)    Dyslipidemia    GERD (gastroesophageal reflux disease)    Hearing loss    History of echocardiogram    Echo 8/18: EF 50-55, normal wall motion, grade 1 diastolic dysfunction, mild MR   Hypertension    Memory difficulty 10/25/2017   Memory loss    MRSA carrier    Pneumonia    Schizoaffective disorder    Seizures ([place])    last 4-5 years ago per pt   Skin cancer     Past Surgical History:  Procedure Laterality Date   CARPAL TUNNEL RELEASE     COLONOSCOPY  12/17/2008   COLONOSCOPY  07/23/2018   Dr.Daiel Strohecker   ORIF ANKLE FRACTURE Left 11/26/2018   Procedure: OPEN REDUCTION INTERNAL FIXATION (ORIF) ANKLE FRACTURE;  Surgeon: Hiram Gash, MD;  Location: [place];  Service: Orthopedics;  Laterality: Left;   ORIF SHOULDER FRACTURE Right    Arthroplasty   self orchectomy Bilateral    SHOULDER CLOSED REDUCTION Left 09/16/2013   Procedure: CLOSED REDUCTION SHOULDER;  Surgeon: Mauri Pole, MD;  Location: [facility];  Service: Orthopedics;  Laterality: Left;   SHOULDER HEMI-ARTHROPLASTY Left 09/18/2013   Procedure: LEFT SHOULDER HEMI-ARTHROPLASTY;  Surgeon: Augustin Schooling, MD;  Location: [place];  Service: Orthopedics;  Laterality: Left;   TIBIA IM NAIL INSERTION Left 11/26/2018   Procedure: INTRAMEDULLARY (IM) NAIL TIBIAL;  Surgeon: Hiram Gash, MD;  Location: [place];  Service: Orthopedics;  Laterality: Left;   UPPER GASTROINTESTINAL  ENDOSCOPY  07/23/2018   Dr.Kitzia Camus    Current Outpatient Medications  Medication Instructions   amitriptyline (ELAVIL) 200 mg, Oral, Daily at bedtime   aspirin EC 81 mg, Oral, Daily at bedtime, Swallow whole.   atorvastatin (LIPITOR) 40 MG tablet TAKE 1 TABLET BY MOUTH EVERY DAY   Belsomra 10 mg, Oral, Daily at bedtime   Lacosamide (VIMPAT) 150 mg, Oral, 2 times daily   metFORMIN (GLUCOPHAGE-XR) 1,000 mg, Oral, Nightly   metoprolol succinate (TOPROL-XL) 100 MG 24 hr tablet TAKE 2 TABLETS BY MOUTH AT BEDTIME *TAKE WITH OR IMMEDIATELY FOLLOWING A MEAL*   Multiple Vitamin (MULTIVITAMIN WITH MINERALS) TABS tablet 1 tablet, Oral, Daily   polyethylene glycol powder ([employer] PURELAX) powder TAKE 17 GRAMS 2 TIMES A DAY AS NEEDED FOR CONSTIPATION. MIX INTO 8 OF FLUID AND DRINK   QUEtiapine (SEROQUEL) 400 mg, Oral, Daily at bedtime   testosterone cypionate (DEPOTESTOSTERONE CYPIONATE) 200 mg, Subcutaneous, Every 14 days    Allergies as of 04/16/2022 - Review Complete 04/03/2022  Allergen Reaction Noted   Codeine Itching and Nausea And Vomiting 01/20/2014   Sulfa antibiotics Nausea And Vomiting 01/08/2012   Serotonin reuptake inhibitors (ssris)  04/03/2022   Sulfasalazine Nausea And Vomiting 01/08/2012    Family History  Problem Relation Age of Onset   Stroke Mother        Stroke in late 23's. Died in her 34's   Fibromyalgia Mother  Stroke Father        Living at 58   Alcohol abuse Brother    Alcohol abuse Brother    Diabetes Brother    Colon cancer Neg Hx    Esophageal cancer Neg Hx    Stomach cancer Neg Hx    Rectal cancer Neg Hx     Social History   Socioeconomic History   Marital status: Single    Spouse name: Not on file   Number of children: 0   Years of education: 18   Highest education level: Not on file  Occupational History   Occupation: disabled  Tobacco Use   Smoking status: Never    Passive exposure: Never   Smokeless tobacco: Never  Vaping Use   Vaping Use:  Never used  Substance and Sexual Activity   Alcohol use: No    Alcohol/week: 0.0 standard drinks of alcohol   Drug use: No   Sexual activity: Not Currently    Partners: Female    Birth control/protection: None  Other Topics Concern   Not on file  Social History Narrative   Patient does not drink caffeine.   Patient is left handed.   Social Determinants of Health   Financial Resource Strain: Low Risk  (08/04/2018)   Overall Financial Resource Strain (CARDIA)    Difficulty of Paying Living Expenses: Not hard at all  Food Insecurity: No Food Insecurity (08/04/2018)   Hunger Vital Sign    Worried About Running Out of Food in the Last Year: Never true    Ran Out of Food in the Last Year: Never true  Transportation Needs: No Transportation Needs (08/04/2018)   PRAPARE - Hydrologist (Medical): No    Lack of Transportation (Non-Medical): No  Physical Activity: Inactive (08/04/2018)   Exercise Vital Sign    Days of Exercise per Week: 0 days    Minutes of Exercise per Session: 0 min  Stress: No Stress Concern Present (08/04/2018)   [place]    Feeling of Stress : Only a little  Social Connections: Unknown (08/04/2018)   Social Connection and Isolation Panel [NHANES]    Frequency of Communication with Friends and Family: More than three times a week    Frequency of Social Gatherings with Friends and Family: More than three times a week    Attends Religious Services: Never    Marine scientist or Organizations: No    Attends Music therapist: Never    Marital Status: Not on file  Intimate Partner Violence: Not on file     Physical Exam:  Constitutional: generally well-appearing Psychiatric: alert and oriented x3 Abdomen: soft, nontender, nondistended, no obvious ascites, no peritoneal signs, normal bowel sounds No peripheral edema noted in lower extremities  Assessment  and plan: 66 y.o. male with h/o polyps  Surveillance colonoscopy today  Please see the "Patient Instructions" section for addition details about the plan.  Owens Loffler, MD [facility] 07/05/2022, 7:09 AM

## 2022-07-09 ENCOUNTER — Encounter (HOSPITAL_COMMUNITY): Payer: Self-pay | Admitting: Gastroenterology

## 2022-07-10 ENCOUNTER — Other Ambulatory Visit: Payer: Self-pay

## 2022-07-10 DIAGNOSIS — Z86010 Personal history of colonic polyps: Secondary | ICD-10-CM

## 2022-07-10 MED ORDER — PEG 3350-KCL-NA BICARB-NACL 420 G PO SOLR: 4000.0000 mL | Freq: Once | ORAL | 0 refills | Status: AC

## 2022-07-10 NOTE — Telephone Encounter (Signed)
Left message on machine to call back  

## 2022-07-10 NOTE — Telephone Encounter (Signed)
Colon scheduled for 8/24 with DJ at [facility] at 9 am

## 2022-07-11 NOTE — Telephone Encounter (Signed)
Left message on machine to call back  

## 2022-07-12 NOTE — Telephone Encounter (Signed)
I have been unable to reach the pt by phone.  All information has been mailed to the pt home.

## 2022-07-25 NOTE — Telephone Encounter (Signed)
The pt has been advised that he will need a 2 day prep that has been sent to the pharmacy.  He has an appt with Dr Ardis Hughs on 8/24 that will need to be moved due to Dr Ardis Hughs being out of the office for the foreseeable future.  He is aware I will call back as soon as able to go over new appt information.

## 2022-07-25 NOTE — Telephone Encounter (Signed)
Patient called requesting to speak with a nurse regarding his prep medication for hospital procedure.

## 2022-07-26 NOTE — Telephone Encounter (Signed)
appt moved to 8/24 with GM at 415 pm the pt has been advised of the new time and MD.  All questions answered.

## 2022-07-26 NOTE — Telephone Encounter (Signed)
Patient called requesting to reschedule hospital procedure.

## 2022-07-27 NOTE — Telephone Encounter (Signed)
Inbound call from patient requesting to speak with a nurse in regards to upcoming procedure 08/09/22 at [facility]. Please give a call back to further advise.

## 2022-07-29 ENCOUNTER — Other Ambulatory Visit: Payer: Self-pay | Admitting: Student

## 2022-07-29 DIAGNOSIS — E119 Type 2 diabetes mellitus without complications: Secondary | ICD-10-CM

## 2022-07-30 NOTE — Telephone Encounter (Signed)
Left message on machine to call back  

## 2022-07-30 NOTE — Telephone Encounter (Signed)
Please let patient know I am refilling this medication, but he needs to schedule an appointment with me.   Thanks, Shaena Parkerson J Elzora Cullins, MD  

## 2022-07-31 NOTE — Telephone Encounter (Signed)
The pt was concerned that the miralax was not going to be enough to clean him out for the upcoming colon.  He was advised that he is doing miralax 2 days prior as well as Golytely 1 day prior.  He has been made aware that this is being done to ensure he is clean for the colon.  The pt has been advised of the information and verbalized understanding.

## 2022-08-02 ENCOUNTER — Encounter (HOSPITAL_COMMUNITY): Payer: Self-pay | Admitting: Gastroenterology

## 2022-08-02 ENCOUNTER — Other Ambulatory Visit: Payer: Self-pay | Admitting: Diagnostic Neuroimaging

## 2022-08-08 ENCOUNTER — Telehealth: Payer: Self-pay | Admitting: Gastroenterology

## 2022-08-08 NOTE — Telephone Encounter (Signed)
I spoke with the pt and confirmed with him to arrive at 230 pm.  The pt has been advised of the information and verbalized understanding.

## 2022-08-08 NOTE — Telephone Encounter (Signed)
Inbound call from patient inquiring about arrival of procedure for 08/09/22 at 4:00 pm at [facility]. Please give a call to further advise.  Thank you.

## 2022-08-09 ENCOUNTER — Ambulatory Visit (HOSPITAL_BASED_OUTPATIENT_CLINIC_OR_DEPARTMENT_OTHER): Payer: Medicare Other | Admitting: Certified Registered"

## 2022-08-09 ENCOUNTER — Ambulatory Visit (HOSPITAL_COMMUNITY): Payer: Medicare Other | Admitting: Certified Registered"

## 2022-08-09 ENCOUNTER — Other Ambulatory Visit: Payer: Self-pay

## 2022-08-09 ENCOUNTER — Ambulatory Visit (HOSPITAL_COMMUNITY)
Admission: RE | Admit: 2022-08-09 | Discharge: 2022-08-09 | Disposition: A | Discharge status: Home / Self Care | Payer: Medicare Other | Attending: Gastroenterology | Admitting: Gastroenterology

## 2022-08-09 ENCOUNTER — Encounter (HOSPITAL_COMMUNITY)
Admission: RE | Disposition: A | Discharge status: Home / Self Care | Payer: Self-pay | Source: Home / Self Care | Attending: Gastroenterology

## 2022-08-09 ENCOUNTER — Encounter (HOSPITAL_COMMUNITY): Payer: Self-pay | Admitting: Gastroenterology

## 2022-08-09 DIAGNOSIS — E119 Type 2 diabetes mellitus without complications: Secondary | ICD-10-CM | POA: Insufficient documentation

## 2022-08-09 DIAGNOSIS — K641 Second degree hemorrhoids: Secondary | ICD-10-CM | POA: Diagnosis not present

## 2022-08-09 DIAGNOSIS — Z823 Family history of stroke: Secondary | ICD-10-CM | POA: Diagnosis not present

## 2022-08-09 DIAGNOSIS — Z8673 Personal history of transient ischemic attack (TIA), and cerebral infarction without residual deficits: Secondary | ICD-10-CM

## 2022-08-09 DIAGNOSIS — I10 Essential (primary) hypertension: Secondary | ICD-10-CM | POA: Insufficient documentation

## 2022-08-09 DIAGNOSIS — K219 Gastro-esophageal reflux disease without esophagitis: Secondary | ICD-10-CM | POA: Diagnosis not present

## 2022-08-09 DIAGNOSIS — K635 Polyp of colon: Secondary | ICD-10-CM | POA: Diagnosis not present

## 2022-08-09 DIAGNOSIS — D123 Benign neoplasm of transverse colon: Secondary | ICD-10-CM

## 2022-08-09 DIAGNOSIS — Z1211 Encounter for screening for malignant neoplasm of colon: Secondary | ICD-10-CM | POA: Insufficient documentation

## 2022-08-09 DIAGNOSIS — Z09 Encounter for follow-up examination after completed treatment for conditions other than malignant neoplasm: Secondary | ICD-10-CM

## 2022-08-09 DIAGNOSIS — F418 Other specified anxiety disorders: Secondary | ICD-10-CM

## 2022-08-09 DIAGNOSIS — G4730 Sleep apnea, unspecified: Secondary | ICD-10-CM | POA: Diagnosis not present

## 2022-08-09 DIAGNOSIS — Z833 Family history of diabetes mellitus: Secondary | ICD-10-CM | POA: Insufficient documentation

## 2022-08-09 DIAGNOSIS — Z86010 Personal history of colonic polyps: Secondary | ICD-10-CM | POA: Diagnosis not present

## 2022-08-09 HISTORY — PX: COLONOSCOPY WITH PROPOFOL: SHX5780

## 2022-08-09 HISTORY — PX: POLYPECTOMY: SHX5525

## 2022-08-09 LAB — GLUCOSE, CAPILLARY: Glucose-Capillary: 122 mg/dL — ABNORMAL HIGH (ref 70–99)

## 2022-08-09 SURGERY — COLONOSCOPY WITH PROPOFOL
Anesthesia: Monitor Anesthesia Care

## 2022-08-09 MED ORDER — PROPOFOL 500 MG/50ML IV EMUL
INTRAVENOUS | Status: DC | PRN
  Administered 2022-08-09: 125 ug/kg/min via INTRAVENOUS

## 2022-08-09 MED ORDER — PROPOFOL 500 MG/50ML IV EMUL
INTRAVENOUS | Status: AC
  Filled 2022-08-09: qty 50

## 2022-08-09 MED ORDER — PROPOFOL 10 MG/ML IV BOLUS
INTRAVENOUS | Status: DC | PRN
  Administered 2022-08-09 (×2): 10 mg via INTRAVENOUS
  Administered 2022-08-09: 30 mg via INTRAVENOUS

## 2022-08-09 MED ORDER — LACTATED RINGERS IV SOLN: INTRAVENOUS | Status: DC

## 2022-08-09 MED ORDER — SODIUM CHLORIDE 0.9 % IV SOLN: INTRAVENOUS | Status: DC

## 2022-08-09 SURGICAL SUPPLY — 22 items

## 2022-08-09 NOTE — Anesthesia Procedure Notes (Signed)
Procedure Name: MAC Date/Time: 08/09/2022 3:30 PM  Performed by: Niel Hummer, CRNAPre-anesthesia Checklist: Patient identified, Patient being monitored, Suction available and Emergency Drugs available Oxygen Delivery Method: Simple face mask

## 2022-08-09 NOTE — Discharge Instructions (Signed)

## 2022-08-09 NOTE — Transfer of Care (Signed)
Immediate Anesthesia Transfer of Care Note  Patient: [name]  Procedure(s) Performed: COLONOSCOPY WITH PROPOFOL POLYPECTOMY  Patient Location: PACU  Anesthesia Type:MAC  Level of Consciousness: drowsy  Airway & Oxygen Therapy: Patient Spontanous Breathing and Patient connected to face mask oxygen  Post-op Assessment: Report given to RN, Post -op Vital signs reviewed and stable and Patient moving all extremities X 4  Post vital signs: Reviewed and stable  Last Vitals:  Vitals Value Taken Time  BP 104/58 08/09/22 1617  Temp    Pulse 69 08/09/22 1617  Resp 27 08/09/22 1617  SpO2 100 % 08/09/22 1617    Last Pain:  Vitals:   08/09/22 1435  TempSrc: Temporal  PainSc: 0-No pain         Complications: No notable events documented.

## 2022-08-09 NOTE — Op Note (Signed)
[facility] Patient Name: [name] Procedure Date: 08/09/2022 MRN: [id number] Attending MD: Justice Britain , MD Date of Birth: [birth year] CSN: [id number] Age: 66 Admit Type: Outpatient Procedure:                Colonoscopy Indications:              Surveillance: Personal history of adenomatous                            polyps on last colonoscopy > 3 years ago Providers:                Justice Britain, MD, Carlyn Reichert, RN, Hinton Dyer Technician, Technician Referring MD:             Milus Banister, MD, Leeanne Rio Medicines:                Monitored Anesthesia Care Complications:            No immediate complications. Estimated Blood Loss:     Estimated blood loss was minimal. Procedure:                Pre-Anesthesia Assessment:                           - Prior to the procedure, a History and Physical                            was performed, and patient medications and                            allergies were reviewed. The patient's tolerance of                            previous anesthesia was also reviewed. The risks                            and benefits of the procedure and the sedation                            options and risks were discussed with the patient.                            All questions were answered, and informed consent                            was obtained. Prior Anticoagulants: The patient has                            taken no previous anticoagulant or antiplatelet                            agents except for aspirin. ASA Grade Assessment:  III - A patient with severe systemic disease. After                            reviewing the risks and benefits, the patient was                            deemed in satisfactory condition to undergo the                            procedure.                           After obtaining informed consent, the colonoscope                             was passed under direct vision. Throughout the                            procedure, the patient's blood pressure, pulse, and                            oxygen saturations were monitored continuously. The                            CF-HQ190L ([id number] Olympus colonoscope was                            introduced through the anus and advanced to the the                            cecum, identified by appendiceal orifice and                            ileocecal valve. The colonoscopy was performed                            without difficulty. The patient tolerated the                            procedure. The quality of the bowel preparation was                            adequate. The ileocecal valve, appendiceal orifice,                            and rectum were photographed. Scope In: 3:40:58 PM Scope Out: 4:08:32 PM Scope Withdrawal Time: 0 hours 21 minutes 31 seconds  Total Procedure Duration: 0 hours 27 minutes 34 seconds  Findings:      The digital rectal exam findings include hemorrhoids. Pertinent       negatives include no palpable rectal lesions.      A large amount of liquid stool was found in the entire colon,       interfering with visualization. Lavage of the area was performed using       copious  amounts, resulting in clearance with adequate visualization.      The colon (entire examined portion) was grossly redundant.      A 5 mm polyp was found in the transverse colon. The polyp was sessile.       The polyp was removed with a cold snare. Resection and retrieval were       complete.      Non-bleeding non-thrombosed external and internal hemorrhoids were found       during retroflexion, during perianal exam and during digital exam. The       hemorrhoids were Grade II (internal hemorrhoids that prolapse but reduce       spontaneously). Impression:               - Hemorrhoids found on digital rectal exam.                           - Stool in the entire  examined colon - lavaged                            copiously with adequate visualization.                           - Redundant colon.                           - One 5 mm polyp in the transverse colon, removed                            with a cold snare. Resected and retrieved.                           - Non-bleeding non-thrombosed external and internal                            hemorrhoids. Moderate Sedation:      Not Applicable - Patient had care per Anesthesia. Recommendation:           - The patient will be observed post-procedure,                            until all discharge criteria are met.                           - Discharge patient to home.                           - Patient has a contact number available for                            emergencies. The signs and symptoms of potential                            delayed complications were discussed with the                            patient. Return to normal activities tomorrow.  Written discharge instructions were provided to the                            patient.                           - High fiber diet.                           - Use FiberCon 1-2 tablets PO daily.                           - Continue present medications.                           - Await pathology results.                           - Repeat colonoscopy in 7 years for surveillance.                           - The findings and recommendations were discussed                            with the patient.                           - The findings and recommendations were discussed                            with the designated responsible adult. Procedure Code(s):        --- Professional ---                           [phone], Colonoscopy, flexible; with removal of                            tumor(s), polyp(s), or other lesion(s) by snare                            technique Diagnosis Code(s):        --- Professional ---                            Z86.010, Personal history of colonic polyps                           K64.1, Second degree hemorrhoids                           K63.5, Polyp of colon CPT copyright 2019 American Medical Association. All rights reserved. The codes documented in this report are preliminary and upon coder review may  be revised to meet current compliance requirements. Justice Britain, MD 08/09/2022 4:19:49 PM Number of Addenda: 0

## 2022-08-09 NOTE — Anesthesia Preprocedure Evaluation (Addendum)
Anesthesia Evaluation  Patient identified by MRN, date of birth, ID band Patient awake    Reviewed: Allergy & Precautions, NPO status , Patient's Chart, lab work & pertinent test results, reviewed documented beta blocker date and time   History of Anesthesia Complications Negative for: history of anesthetic complications  Airway Mallampati: IV  TM Distance: >3 FB Neck ROM: Full    Dental  (+) Dental Advisory Given, Chipped   Pulmonary sleep apnea ,    Pulmonary exam normal        Cardiovascular hypertension, Pt. on home beta blockers and Pt. on medications Normal cardiovascular exam   '18 Myoview - The left ventricular ejection fraction is mildly decreased (45-54%). Nuclear stress EF: 54%. No T wave inversion was noted during stress. There was no ST segment deviation noted during stress. This is a low risk study.  '18 TTE - EF 50-55%, mild MR, grade 1 diastolic dysfunction    Neuro/Psych Seizures -, Well Controlled,  PSYCHIATRIC DISORDERS Anxiety Depression  Schizoaffective d/o OCD  Vertigo  TIA   GI/Hepatic Neg liver ROS, GERD  Controlled,  Endo/Other  diabetes, Type 2, Oral Hypoglycemic Agents  Renal/GU negative Renal ROS     Musculoskeletal negative musculoskeletal ROS (+)   Abdominal   Peds  Hematology negative hematology ROS (+)   Anesthesia Other Findings   Reproductive/Obstetrics                           Anesthesia Physical Anesthesia Plan  ASA: 3  Anesthesia Plan: MAC   Post-op Pain Management: Minimal or no pain anticipated   Induction:   PONV Risk Score and Plan: 1 and Propofol infusion and Treatment may vary due to age or medical condition  Airway Management Planned: Nasal Cannula and Natural Airway  Additional Equipment: None  Intra-op Plan:   Post-operative Plan:   Informed Consent: I have reviewed the patients History and Physical, chart, labs and  discussed the procedure including the risks, benefits and alternatives for the proposed anesthesia with the patient or authorized representative who has indicated his/her understanding and acceptance.       Plan Discussed with: CRNA and Anesthesiologist  Anesthesia Plan Comments:        Anesthesia Quick Evaluation

## 2022-08-09 NOTE — H&P (Signed)
GASTROENTEROLOGY PROCEDURE H&P NOTE   Primary Care Physician: Leeanne Rio, MD  HPI: [name] is a 66 y.o. male who presents for Colonoscopy for surveillance of colon polyps failed recent colonoscopy due to incomplete preparation with Dr. Ardis Hughs.  Past Medical History:  Diagnosis Date   Anxiety    Bowel obstruction (HCC)    Chronic insomnia 04/14/2020   Colon polyps    Constipation    Depression    Diabetes mellitus without complication (HCC)    Dyslipidemia    GERD (gastroesophageal reflux disease)    Hearing loss    History of echocardiogram    Echo 8/18: EF 50-55, normal wall motion, grade 1 diastolic dysfunction, mild MR   Hypertension    Memory difficulty 10/25/2017   Memory loss    MRSA carrier    Pneumonia    Schizoaffective disorder    Seizures ([place])    last 4-5 years ago per pt   Skin cancer    Past Surgical History:  Procedure Laterality Date   CARPAL TUNNEL RELEASE     COLONOSCOPY  12/17/2008   COLONOSCOPY  07/23/2018   Dr.Jacobs   COLONOSCOPY WITH PROPOFOL N/A 07/05/2022   Procedure: COLONOSCOPY WITH PROPOFOL;  Surgeon: Milus Banister, MD;  Location: [facility];  Service: Gastroenterology;  Laterality: N/A;  Incomplete Colon   ORIF ANKLE FRACTURE Left 11/26/2018   Procedure: OPEN REDUCTION INTERNAL FIXATION (ORIF) ANKLE FRACTURE;  Surgeon: Hiram Gash, MD;  Location: [facility];  Service: Orthopedics;  Laterality: Left;   ORIF SHOULDER FRACTURE Right    Arthroplasty   self orchectomy Bilateral    SHOULDER CLOSED REDUCTION Left 09/16/2013   Procedure: CLOSED REDUCTION SHOULDER;  Surgeon: Mauri Pole, MD;  Location: [facility];  Service: Orthopedics;  Laterality: Left;   SHOULDER HEMI-ARTHROPLASTY Left 09/18/2013   Procedure: LEFT SHOULDER HEMI-ARTHROPLASTY;  Surgeon: Augustin Schooling, MD;  Location: [facility];  Service: Orthopedics;  Laterality: Left;   TIBIA IM NAIL INSERTION Left 11/26/2018   Procedure: INTRAMEDULLARY (IM) NAIL TIBIAL;   Surgeon: Hiram Gash, MD;  Location: [facility];  Service: Orthopedics;  Laterality: Left;   UPPER GASTROINTESTINAL ENDOSCOPY  07/23/2018   Dr.Jacobs   Current Facility-Administered Medications  Medication Dose Route Frequency Provider Last Rate Last Admin   0.9 %  sodium chloride infusion   Intravenous Continuous Milus Banister, MD       lactated ringers infusion   Intravenous Continuous Mansouraty, Telford Nab., MD 10 mL/hr at 08/09/22 1449 New Bag at 08/09/22 1449    Current Facility-Administered Medications:    0.9 %  sodium chloride infusion, , Intravenous, Continuous, Milus Banister, MD   lactated ringers infusion, , Intravenous, Continuous, Mansouraty, Telford Nab., MD, Last Rate: 10 mL/hr at 08/09/22 1449, New Bag at 08/09/22 1449 Allergies  Allergen Reactions   Codeine Itching and Nausea And Vomiting   Sulfa Antibiotics Nausea And Vomiting   Serotonin Reuptake Inhibitors (Ssris)     Restless   Sulfasalazine Nausea And Vomiting   Family History  Problem Relation Age of Onset   Stroke Mother        Stroke in late 16's. Died in her 57's   Fibromyalgia Mother    Stroke Father        Living at 86   Alcohol abuse Brother    Alcohol abuse Brother    Diabetes Brother    Colon cancer Neg Hx    Esophageal cancer Neg Hx    Stomach  cancer Neg Hx    Rectal cancer Neg Hx    Social History   Socioeconomic History   Marital status: Single    Spouse name: Not on file   Number of children: 0   Years of education: 67   Highest education level: Not on file  Occupational History   Occupation: disabled  Tobacco Use   Smoking status: Never    Passive exposure: Never   Smokeless tobacco: Never  Vaping Use   Vaping Use: Never used  Substance and Sexual Activity   Alcohol use: No    Alcohol/week: 0.0 standard drinks of alcohol   Drug use: No   Sexual activity: Not Currently    Partners: Female    Birth control/protection: None  Other Topics Concern   Not on file  Social  History Narrative   Patient does not drink caffeine.   Patient is left handed.   Social Determinants of Health   Financial Resource Strain: Low Risk  (08/04/2018)   Overall Financial Resource Strain (CARDIA)    Difficulty of Paying Living Expenses: Not hard at all  Food Insecurity: No Food Insecurity (08/04/2018)   Hunger Vital Sign    Worried About Running Out of Food in the Last Year: Never true    Ran Out of Food in the Last Year: Never true  Transportation Needs: No Transportation Needs (08/04/2018)   PRAPARE - Hydrologist (Medical): No    Lack of Transportation (Non-Medical): No  Physical Activity: Inactive (08/04/2018)   Exercise Vital Sign    Days of Exercise per Week: 0 days    Minutes of Exercise per Session: 0 min  Stress: No Stress Concern Present (08/04/2018)   [place]    Feeling of Stress : Only a little  Social Connections: Unknown (08/04/2018)   Social Connection and Isolation Panel [NHANES]    Frequency of Communication with Friends and Family: More than three times a week    Frequency of Social Gatherings with Friends and Family: More than three times a week    Attends Religious Services: Never    Marine scientist or Organizations: No    Attends Archivist Meetings: Never    Marital Status: Not on file  Intimate Partner Violence: Not on file    Physical Exam: Today's Vitals   08/09/22 1435  BP: (!) 159/78  Pulse: 74  Resp: (!) 21  Temp: 97.7 F (36.5 C)  TempSrc: Temporal  Weight: 93 kg  Height: '5\' 9"'$  (1.753 m)  PainSc: 0-No pain   Body mass index is 30.27 kg/m. GEN: NAD EYE: Sclerae anicteric ENT: MMM CV: Non-tachycardic GI: Soft, NT/ND NEURO:  Alert & Oriented x 3  Lab Results: No results for input(s): "WBC", "HGB", "HCT", "PLT" in the last 72 hours. BMET No results for input(s): "NA", "K", "CL", "CO2", "GLUCOSE", "BUN",  "CREATININE", "CALCIUM" in the last 72 hours. LFT No results for input(s): "PROT", "ALBUMIN", "AST", "ALT", "ALKPHOS", "BILITOT", "BILIDIR", "IBILI" in the last 72 hours. PT/INR No results for input(s): "LABPROT", "INR" in the last 72 hours.   Impression / Plan: This is a 66 y.o.male who presents for Colonoscopy for surveillance of colon polyps failed recent colonoscopy due to incomplete preparation with Dr. Ardis Hughs.  The risks and benefits of endoscopic evaluation/treatment were discussed with the patient and/or family; these include but are not limited to the risk of perforation, infection, bleeding, missed lesions, lack of  diagnosis, severe illness requiring hospitalization, as well as anesthesia and sedation related illnesses.  The patient's history has been reviewed, patient examined, no change in status, and deemed stable for procedure.  The patient and/or family is agreeable to proceed.    Justice Britain, MD [facility] Office # [phone]

## 2022-08-10 NOTE — Anesthesia Postprocedure Evaluation (Signed)
Anesthesia Post Note  Patient: [name]  Procedure(s) Performed: COLONOSCOPY WITH PROPOFOL POLYPECTOMY     Patient location during evaluation: PACU Anesthesia Type: MAC Level of consciousness: awake and alert Pain management: pain level controlled Vital Signs Assessment: post-procedure vital signs reviewed and stable Respiratory status: spontaneous breathing, nonlabored ventilation and respiratory function stable Cardiovascular status: stable and blood pressure returned to baseline Anesthetic complications: no   No notable events documented.  Last Vitals:  Vitals:   08/09/22 1640 08/09/22 1645  BP: 125/70 129/68  Pulse: 65 (!) 56  Resp: 18 20  Temp:    SpO2: 96% 93%    Last Pain:  Vitals:   08/09/22 1645  TempSrc:   PainSc: 0-No pain                 Audry Pili

## 2022-08-12 ENCOUNTER — Encounter (HOSPITAL_COMMUNITY): Payer: Self-pay | Admitting: Gastroenterology

## 2022-08-13 NOTE — Progress Notes (Deleted)
    SUBJECTIVE:   CHIEF COMPLAINT / HPI:   *** T2DM Taking metformin 1,000 mg q night Ophthalmology? Urine microalbumin? Zoster?  HTN Taking Toprol 100 mg dialy  PERTINENT  PMH / PSH: ***  OBJECTIVE:   There were no vitals taken for this visit. ***  General: NAD, pleasant, able to participate in exam Cardiac: RRR, no murmurs. Respiratory: CTAB, normal effort, No wheezes, rales or rhonchi Abdomen: Bowel sounds present, nontender, nondistended, no hepatosplenomegaly. Extremities: no edema or cyanosis. Skin: warm and dry, no rashes noted Neuro: alert, no obvious focal deficits Psych: Normal affect and mood  ASSESSMENT/PLAN:   No problem-specific Assessment & Plan notes found for this encounter.     Dr. Precious Gilding, [facility]    {    This will disappear when note is signed, click to select method of visit    :1}

## 2022-08-14 ENCOUNTER — Ambulatory Visit: Payer: Medicare Other | Admitting: Student

## 2022-08-14 ENCOUNTER — Encounter: Payer: Self-pay | Admitting: Gastroenterology

## 2022-08-14 DIAGNOSIS — E119 Type 2 diabetes mellitus without complications: Secondary | ICD-10-CM

## 2022-08-14 LAB — SURGICAL PATHOLOGY

## 2022-08-28 ENCOUNTER — Other Ambulatory Visit: Payer: Self-pay | Admitting: Family Medicine

## 2022-08-28 ENCOUNTER — Ambulatory Visit (INDEPENDENT_AMBULATORY_CARE_PROVIDER_SITE_OTHER): Payer: Medicare Other | Admitting: Family Medicine

## 2022-08-28 ENCOUNTER — Encounter: Payer: Self-pay | Admitting: Family Medicine

## 2022-08-28 VITALS — BP 118/75 | HR 76 | Temp 98.4°F | Ht 69.0 in | Wt 211.6 lb

## 2022-08-28 DIAGNOSIS — B370 Candidal stomatitis: Secondary | ICD-10-CM | POA: Diagnosis not present

## 2022-08-28 DIAGNOSIS — I10 Essential (primary) hypertension: Secondary | ICD-10-CM | POA: Diagnosis not present

## 2022-08-28 DIAGNOSIS — K5909 Other constipation: Secondary | ICD-10-CM | POA: Diagnosis not present

## 2022-08-28 DIAGNOSIS — H6120 Impacted cerumen, unspecified ear: Secondary | ICD-10-CM | POA: Insufficient documentation

## 2022-08-28 DIAGNOSIS — F332 Major depressive disorder, recurrent severe without psychotic features: Secondary | ICD-10-CM

## 2022-08-28 DIAGNOSIS — E119 Type 2 diabetes mellitus without complications: Secondary | ICD-10-CM

## 2022-08-28 DIAGNOSIS — E785 Hyperlipidemia, unspecified: Secondary | ICD-10-CM

## 2022-08-28 DIAGNOSIS — E1169 Type 2 diabetes mellitus with other specified complication: Secondary | ICD-10-CM | POA: Diagnosis not present

## 2022-08-28 DIAGNOSIS — H6123 Impacted cerumen, bilateral: Secondary | ICD-10-CM

## 2022-08-28 LAB — POCT GLYCOSYLATED HEMOGLOBIN (HGB A1C): HbA1c, POC (controlled diabetic range): 6.2 % (ref 0.0–7.0)

## 2022-08-28 MED ORDER — NYSTATIN 100000 UNIT/ML MT SUSP: 5.0000 mL | Freq: Four times a day (QID) | OROMUCOSAL | 0 refills | Status: AC

## 2022-08-28 MED ORDER — LINACLOTIDE 145 MCG PO CAPS: 145.0000 ug | ORAL_CAPSULE | Freq: Every day | ORAL | 0 refills | Status: DC

## 2022-08-28 NOTE — Assessment & Plan Note (Signed)
Visualized on examination. Has some decreased appetite which may be related.  -Start Nystatin QID x5 days

## 2022-08-28 NOTE — Assessment & Plan Note (Signed)
PHQ-9 positive today.  He admits to intermittent thoughts of SI but has no active plan.  No weapons in his home.  He feels well supported and is followed by psychiatry. Declined therapy. This appears chronic for him and I feel he is safe and stable at this time- he can continue current regimen until follow up with Psychiatrist.

## 2022-08-28 NOTE — Progress Notes (Signed)
SUBJECTIVE:   Chief compliant/HPI: annual examination  [name] is a 66 y.o. who presents today for an annual exam.   Current Concerns:  Reports continuous constipation, worsening in the last few months. Takes several doses of MiraLAX daily (4-5 doses). States that it helps somewhat but not everything. He has had colonoscopies that he reports were inadequately viewed due to stool burden. Upon review of his most recent it appears there were able to have adequate visualization after lavage.   Schizoaffective Disorder Followed by Psychiatrist, has an appointment next month. Has thoughts he'd be better off dead at times but denies any plans. He lives with his significant other who is supportive of him. Has no access to weapons. Has tried therapy in the past, not interested in it again.   History tabs reviewed and updated.   Review of systems form reviewed and notable for constipation.   OBJECTIVE:   BP 118/75   Pulse 76   Temp 98.4 F (36.9 C)   Ht '5\' 9"'$  (1.753 m)   Wt 211 lb 9.6 oz (96 kg)   SpO2 96%   BMI 31.25 kg/m   General: Awake, alert, oriented, in no acute distress, pleasant and cooperative with examination HEENT: Normocephalic, atraumatic, nares patent, dentition is poor, thrush on tongue that is able to be scraped, TM's unable to be visualized b/l due to cerumen impaction Cardio: RRR without murmur, 2+ radial, DP and PT pulses b/l Respiratory: CTAB without wheezing/rhonchi/rales Abdomen: Soft, obese, non-tender to palpation of all quadrants, non-distended MSK: Able to move all extremities spontaneously, good muscle strength, no abnormalities Extremities: without edema or cyanosis Neuro: Speech is clear and intact, no focal deficits, no facial asymmetry, follows commands  Psych: Flat affect     08/28/2022    1:44 PM 05/18/2022    4:00 PM 02/08/2022    1:37 PM  Depression screen PHQ 2/9  Decreased Interest '2 3 3  '$ Down, Depressed, Hopeless '2 2 2  '$ PHQ - 2 Score '4 5  5  '$ Altered sleeping '2 2 1  '$ Tired, decreased energy '2 2 3  '$ Change in appetite '2 2 1  '$ Feeling bad or failure about yourself  '2 2 2  '$ Trouble concentrating '2 3 2  '$ Moving slowly or fidgety/restless '2 1 1  '$ Suicidal thoughts 1 0 0  PHQ-9 Score '17 17 15  '$ Difficult doing work/chores Very difficult  Very difficult    ASSESSMENT/PLAN:   Constipation, chronic Reports not well controlled even on high-dose MiraLAX.  -Trial Linzess 145 mcg daily -Can trial backing down on amount of MiraLAX as we are starting new medication -Encouraged GI f/u as scheduled  Essential hypertension At goal, continue current regimen. Attempted to collect urine microalbumin but patient unable to void today.  MDD (major depressive disorder), recurrent severe, without psychosis ([place]) PHQ-9 positive today.  He admits to intermittent thoughts of SI but has no active plan.  No weapons in his home.  He feels well supported and is followed by psychiatry. Declined therapy. This appears chronic for him and I feel he is safe and stable at this time- he can continue current regimen until follow up with Psychiatrist.   Ritta Slot, oral Visualized on examination. Has some decreased appetite which may be related.  -Start Nystatin QID x5 days  Cerumen impaction Bilaterally. Recommended OTC ear drops, can return for irrigation in a couple weeks.   DM (diabetes mellitus), type 2 (HCC) Stable on Metformin, A1c at goal. -Continue Metformin -Next A1c  in 6 months   Annual Examination  See AVS for age appropriate recommendations.  PHQ score 17, reviewed and discussed.  Blood pressure value is at goal, discussed.   Considered the following screening exams based upon USPSTF recommendations: Diabetes screening: discussed and ordered Screening for elevated cholesterol: discussed and ordered HIV testing:  declined Hepatitis C:  previously tested and negative Hepatitis B:  declined Syphilis if at high risk:  declined Reviewed risk  factors for latent tuberculosis and not indicated Colorectal cancer screening: up to date on screening for CRC. Had a colonoscopy on 8/24, recommended to return in 7 years Lung cancer screening:  never smoker, not indicated   PSA discussed and after engaging in discussion of possible risks, benefits and complications of screening patient elected to not have testing.     Sharion Settler, [place]

## 2022-08-28 NOTE — Patient Instructions (Signed)
It was wonderful to see you today.  Please bring ALL of your medications with you to every visit.   Today we talked about:  -I am starting you on an anti-fungal medication for presumed thrush in your mouth. Use this 4 times a day for 5 days. -I am starting you on a new medication for your constipation called Linzess. Take this daily before breakfast. Let us know if it helps and you want refills. -Follow up with your GI doctor as scheduled.  -Follow up on your Psychiatrist as scheduled.   Today at your annual preventive visit we talked about the following measures: I recommend 150 minutes of exercise per week-try 30 minutes 5 days per week We discussed reducing sugary beverages (like soda and juice) and increasing leafy greens and whole fruits.  We discussed avoiding tobacco and alcohol.  I recommend avoiding illicit substances.  Your blood pressure is at goal of <103/80.   Thank you for choosing [facility].   Please call [phone] with any questions about today's appointment.  Please be sure to schedule follow up at the front  desk before you leave today.   Sharion Settler, DO PGY-3 Family Medicine

## 2022-08-28 NOTE — Assessment & Plan Note (Signed)
At goal, continue current regimen. Attempted to collect urine microalbumin but patient unable to void today.

## 2022-08-28 NOTE — Assessment & Plan Note (Signed)
Reports not well controlled even on high-dose MiraLAX.  -Trial Linzess 145 mcg daily -Can trial backing down on amount of MiraLAX as we are starting new medication -Encouraged GI f/u as scheduled

## 2022-08-28 NOTE — Assessment & Plan Note (Signed)
Bilaterally. Recommended OTC ear drops, can return for irrigation in a couple weeks.

## 2022-08-28 NOTE — Assessment & Plan Note (Signed)
Stable on Metformin, A1c at goal. -Continue Metformin -Next A1c in 6 months

## 2022-08-29 ENCOUNTER — Encounter: Payer: Self-pay | Admitting: Family Medicine

## 2022-08-29 LAB — LIPID PANEL
Chol/HDL Ratio: 2.5 ratio (ref 0.0–5.0)
Cholesterol, Total: 123 mg/dL (ref 100–199)
HDL: 49 mg/dL (ref >39)
LDL Chol Calc (NIH): 47 mg/dL (ref 0–99)
Triglycerides: 161 mg/dL — ABNORMAL HIGH (ref 0–149)
VLDL Cholesterol Cal: 27 mg/dL (ref 5–40)

## 2022-09-14 ENCOUNTER — Telehealth: Payer: Self-pay

## 2022-09-14 MED ORDER — NYSTATIN 100000 UNIT/ML MT SUSP: 5.0000 mL | Freq: Four times a day (QID) | OROMUCOSAL | 0 refills | Status: AC

## 2022-09-14 MED ORDER — LINACLOTIDE 145 MCG PO CAPS: 145.0000 ug | ORAL_CAPSULE | Freq: Every day | ORAL | 0 refills | Status: DC

## 2022-09-14 NOTE — Addendum Note (Signed)
Addended by: Talbot Grumbling on: 09/14/2022 10:32 AM   Modules accepted: Orders

## 2022-09-14 NOTE — Telephone Encounter (Signed)
Patient calls nurse line regarding prescriptions for Linzess and Nystatin. Patient reports that he never got these medications.   Called the pharmacy. Patient picked up Nystatin on 9/13 and Linzess on 9/19. Patient states that he has not been taking medications. He states that he is going to check around house to see if he can find medications.   Patient will call back if he needs refills.   Talbot Grumbling, RN

## 2022-09-14 NOTE — Telephone Encounter (Signed)
Rx's sent Leeanne Rio, MD

## 2022-09-14 NOTE — Telephone Encounter (Signed)
Patient returns call to nurse line. He was unable to find medication. Please send in refills for Linzess and Nystatin. Linzess pended to this encounter. Nystatin will need to be reordered as medication is not on current medication list.   Forwarding to PCP.   Talbot Grumbling, RN

## 2022-09-14 NOTE — Addendum Note (Signed)
Addended by: Leeanne Rio on: 09/14/2022 11:18 AM   Modules accepted: Orders

## 2022-09-16 ENCOUNTER — Other Ambulatory Visit: Payer: Self-pay | Admitting: Family Medicine

## 2022-09-16 DIAGNOSIS — E785 Hyperlipidemia, unspecified: Secondary | ICD-10-CM

## 2022-09-18 ENCOUNTER — Ambulatory Visit: Payer: Medicare Other | Admitting: Gastroenterology

## 2022-09-19 ENCOUNTER — Telehealth (HOSPITAL_BASED_OUTPATIENT_CLINIC_OR_DEPARTMENT_OTHER): Payer: Medicare Other | Admitting: Psychiatry

## 2022-09-19 VITALS — Wt 211.0 lb

## 2022-09-19 DIAGNOSIS — F250 Schizoaffective disorder, bipolar type: Secondary | ICD-10-CM

## 2022-09-19 DIAGNOSIS — F5101 Primary insomnia: Secondary | ICD-10-CM | POA: Diagnosis not present

## 2022-09-19 DIAGNOSIS — F411 Generalized anxiety disorder: Secondary | ICD-10-CM

## 2022-09-19 MED ORDER — QUETIAPINE FUMARATE 300 MG PO TABS: 300.0000 mg | ORAL_TABLET | Freq: Every day | ORAL | 2 refills | Status: DC

## 2022-09-19 MED ORDER — BELSOMRA 10 MG PO TABS: 10.0000 mg | ORAL_TABLET | Freq: Every day | ORAL | 2 refills | Status: DC

## 2022-09-19 MED ORDER — AMITRIPTYLINE HCL 100 MG PO TABS: 200.0000 mg | ORAL_TABLET | Freq: Every day | ORAL | 2 refills | Status: DC

## 2022-09-19 NOTE — Progress Notes (Signed)
Virtual Visit via Telephone Note  I connected with [name] on 09/19/22 at  3:40 PM EDT by telephone and verified that I am speaking with the correct person using two identifiers.  Location: Patient: Home Provider: Home Office   I discussed the limitations, risks, security and privacy concerns of performing an evaluation and management service by telephone and the availability of in person appointments. I also discussed with the patient that there may be a patient responsible charge related to this service. The patient expressed understanding and agreed to proceed.   History of Present Illness: Patient is evaluated by phone session.  He is sleeping better.  He is taking his medication and denies any recent paranoia or any agitation.  He denies any crying spells or any suicidal thoughts.  Sometimes he does have ruminative thoughts and he thinks about his past and guilt that he did not help his parents as much he like to.  However he is able to distract these thoughts and try to keep himself busy.  Patient told the lady is still living with him for past few years with does help when he needed for grossly but he also trying to go out by himself.  He feels sometimes people looking at him and he tried to go to the grocery store when there is not enough people.  He has no tremor or shakes or any EPS.  Recently had a blood work and his hemoglobin A1c 6.2.  Apparently he was told to cut down his Seroquel.  His appetite is okay.  His weight is unchanged from the past.  He denies any tremors or shakes or any EPS.  He sleeps good with the help of Belsomra.   Past Psychiatric History:  H/O depression, paranoia, suicidal attempt, delusions and multiple inpatient.  H/O cutting testicles and thoughts of inhaling helium to end life. Last inpatient August 2016.  D/C on Seroquel 900 mg and amitriptyline 400 mg HS. Saw at [facility] and [name]. SSRI caused reactions. Tried nortriptyline, Haldol, Geodon, Seroquel,  lithium, Prozac, Wellbutrin, Paxil, Klonopin, Ativan, Lexapro, Zoloft, abilify and Lamictal.  No H/O mania or drug use.  We did GeneSight testing and results are in the chart.  Only antipsychotic medication that is unfavorable section is Saphris and Navane.  Most of the SSRIs are also unfavorable section.     Recent Results (from the past 2160 hour(s))  Glucose, capillary     Status: Abnormal   Collection Time: 07/05/22  7:39 AM  Result Value Ref Range   Glucose-Capillary 110 (H) 70 - 99 mg/dL    Comment: Glucose reference range applies only to samples taken after fasting for at least 8 hours.  Glucose, capillary     Status: Abnormal   Collection Time: 08/09/22  2:42 PM  Result Value Ref Range   Glucose-Capillary 122 (H) 70 - 99 mg/dL    Comment: Glucose reference range applies only to samples taken after fasting for at least 8 hours.  Surgical pathology     Status: None   Collection Time: 08/09/22  3:57 PM  Result Value Ref Range   SURGICAL PATHOLOGY      SURGICAL PATHOLOGY CASE: [id number] PATIENT: [name] Surgical Pathology Report     Clinical History: Colon polyps (crm)     FINAL MICROSCOPIC DIAGNOSIS:  A.   COLON, TRANSVERSE, POLYPECTOMY: -    Colonic mucosa with polypoid focus of enlarged crypts, suspicious for goblet cell-rich hyperplastic polyp. -    No diagnostic  adenomatous change or epithelial serration identified (microscopically examined at 3 additional HE levels). -    No malignancy identified.    GROSS DESCRIPTION:  Received in formalin are tan, soft tissue fragments that are submitted in toto. Number: 2 size: 0.1 and 1.1 cm blocks: 1 (KW, 08/10/2022)    Final Diagnosis performed by Maryan Puls, MD.   Electronically signed 08/14/2022 Technical component performed at [facility], [place] [address] [place], [place] [address].  Professional component performed at [employer]. [facility], [place] [address] [place],  [place] [address].  Immunohistochemistry Techn ical component (if applicable) was performed at [facility]. [address] [place], [place], [place] [address].   IMMUNOHISTOCHEMISTRY DISCLAIMER (if applicable): Some of these immunohistochemical stains may have been developed and the performance characteristics determine by [facility]. Some may not have been cleared or approved by the U.S. Food and Drug Administration. The FDA has determined that such clearance or approval is not necessary. This test is used for clinical purposes. It should not be regarded as investigational or for research. This laboratory is certified under the [place] (CLIA-88) as qualified to perform high complexity clinical laboratory testing.  The controls stained appropriately.   HgB A1c     Status: None   Collection Time: 08/28/22  1:30 PM  Result Value Ref Range   Hemoglobin A1C     HbA1c POC (<> result, manual entry)     HbA1c, POC (prediabetic range)     HbA1c, POC (controlled diabetic range) 6.2 0.0 - 7.0 %  Lipid Panel     Status: Abnormal   Collection Time: 08/28/22  3:20 PM  Result Value Ref Range   Cholesterol, Total 123 100 - 199 mg/dL   Triglycerides 161 (H) 0 - 149 mg/dL   HDL 49 >39 mg/dL   VLDL Cholesterol Cal 27 5 - 40 mg/dL   LDL Chol Calc (NIH) 47 0 - 99 mg/dL   Chol/HDL Ratio 2.5 0.0 - 5.0 ratio    Comment:                                   T. Chol/HDL Ratio                                             Men  Women                               1/2 Avg.Risk  3.4    3.3                                   Avg.Risk  5.0    4.4                                2X Avg.Risk  9.6    7.1                                3X Avg.Risk 23.4   11.0      Psychiatric Specialty  Exam: Physical Exam  Review of Systems  Weight 211 lb (95.7 kg).There is no height or weight on file to calculate BMI.  General Appearance: NA  Eye Contact:  NA   Speech:  Slow  Volume:  Decreased  Mood:  Euthymic  Affect:  NA  Thought Process:  Descriptions of Associations: Intact  Orientation:  Full (Time, Place, and Person)  Thought Content:  Rumination  Suicidal Thoughts:  No  Homicidal Thoughts:  No  Memory:  Immediate;   Fair Recent;   Fair Remote;   Fair  Judgement:  Fair  Insight:  Shallow  Psychomotor Activity:  NA  Concentration:  Concentration: Fair and Attention Span: Fair  Recall:  [employer] of Knowledge:  Fair  Language:  Fair  Akathisia:  No  Handed:  Right  AIMS (if indicated):     Assets:  Communication Skills Desire for Improvement Housing  ADL's:  Intact  Cognition:  WNL  Sleep:   ok      Assessment and Plan: Schizoaffective disorder, bipolar type.  Generalized anxiety disorder.  Primary insomnia.  I reviewed blood work results and hemoglobin A1c.  It is 6.2.  Recommend to reduce the Seroquel from 400 mg to 300 mg to help her better blood sugar and may help his weight.  I encourage walk every day and keep herself active.  I offered therapy but patient is not interested.  Continue Belsomra 10 mg at bedtime, amitriptyline 200 mg at bedtime and we will reduce Seroquel from 400 to 300 mg at bedtime.  Recommended to call us back if is any question or any concern.  Follow-up in 3 months.  Follow Up Instructions:    I discussed the assessment and treatment plan with the patient. The patient was provided an opportunity to ask questions and all were answered. The patient agreed with the plan and demonstrated an understanding of the instructions.   The patient was advised to call back or seek an in-person evaluation if the symptoms worsen or if the condition fails to improve as anticipated.  Collaboration of Care: Other provider involved in patient's care AEB notes are available in epic to review.  Patient/Guardian was advised Release of Information must be obtained prior to any record release in order to collaborate  their care with an outside provider. Patient/Guardian was advised if they have not already done so to contact the registration department to sign all necessary forms in order for [place] to release information regarding their care.   Consent: Patient/Guardian gives verbal consent for treatment and assignment of benefits for services provided during this visit. Patient/Guardian expressed understanding and agreed to proceed.    I provided 25 minutes of non-face-to-face time during this encounter.   Kathlee Nations, MD

## 2022-09-30 ENCOUNTER — Other Ambulatory Visit: Payer: Self-pay | Admitting: Neurology

## 2022-10-03 ENCOUNTER — Other Ambulatory Visit: Payer: Self-pay | Admitting: Family Medicine

## 2022-10-03 ENCOUNTER — Other Ambulatory Visit (HOSPITAL_COMMUNITY): Payer: Self-pay | Admitting: Psychiatry

## 2022-10-03 DIAGNOSIS — F250 Schizoaffective disorder, bipolar type: Secondary | ICD-10-CM

## 2022-10-16 ENCOUNTER — Other Ambulatory Visit (HOSPITAL_COMMUNITY): Payer: Self-pay | Admitting: Psychiatry

## 2022-10-16 DIAGNOSIS — F250 Schizoaffective disorder, bipolar type: Secondary | ICD-10-CM

## 2022-10-29 ENCOUNTER — Other Ambulatory Visit (HOSPITAL_COMMUNITY): Payer: Self-pay | Admitting: Psychiatry

## 2022-10-29 DIAGNOSIS — F250 Schizoaffective disorder, bipolar type: Secondary | ICD-10-CM

## 2022-11-19 ENCOUNTER — Ambulatory Visit (INDEPENDENT_AMBULATORY_CARE_PROVIDER_SITE_OTHER): Payer: Medicare Other | Admitting: Student

## 2022-11-19 ENCOUNTER — Ambulatory Visit: Payer: Medicare Other | Admitting: Family Medicine

## 2022-11-19 ENCOUNTER — Encounter: Payer: Self-pay | Admitting: Student

## 2022-11-19 VITALS — BP 128/78 | HR 90 | Ht 69.0 in | Wt 212.0 lb

## 2022-11-19 DIAGNOSIS — E119 Type 2 diabetes mellitus without complications: Secondary | ICD-10-CM | POA: Diagnosis not present

## 2022-11-19 DIAGNOSIS — F251 Schizoaffective disorder, depressive type: Secondary | ICD-10-CM | POA: Diagnosis not present

## 2022-11-19 DIAGNOSIS — L309 Dermatitis, unspecified: Secondary | ICD-10-CM | POA: Insufficient documentation

## 2022-11-19 LAB — POCT GLYCOSYLATED HEMOGLOBIN (HGB A1C): HbA1c, POC (controlled diabetic range): 6 % (ref 0.0–7.0)

## 2022-11-19 MED ORDER — HYDROCORTISONE 2.5 % EX OINT: TOPICAL_OINTMENT | CUTANEOUS | 0 refills | Status: DC

## 2022-11-19 NOTE — Assessment & Plan Note (Signed)
Bilateral scaly, erythematous skin changes over eyelids consistent with eyelid eczema.  Also consider seborrhea, but less likely. -Prescribed hydrocortisone 2.5% ointment to be used twice daily during flare followed by topical layer of Vaseline over top. -Patient to follow-up if no improvement

## 2022-11-19 NOTE — Progress Notes (Signed)
    SUBJECTIVE:   CHIEF COMPLAINT / HPI:   [name] is a 66 year old male here for A1c check and skin complaint.  Bilateral eyelid dryness Itching and dryness over bilateral eyelids. This started a few weeks ago. He has not been putting anything on the area.  He noticed that the skin color has also changed in color and it is more pink than usual.    Schizoaffective disorder He says he stays in bed most days and does feel some significant depression.  He is thinking of getting a dog to help with his loneliness. Follows with psychiatry.  No access to weapons.  Does not have any current plan to harm himself.  Would also like his A1c checked today.  PERTINENT  PMH / PSH: Reviewed  OBJECTIVE:   BP 128/78   Pulse 90   Ht '5\' 9"'$  (1.753 m)   Wt 212 lb (96.2 kg)   SpO2 95%   BMI 31.31 kg/m  General: Pleasant, no distress CV: Regular rate and rhythm Respiratory: Normal work of breathing on room air Skin: Scaly, erythematous patches over bilateral eyelids consistent with eczema   ASSESSMENT/PLAN:   Eczema Bilateral scaly, erythematous skin changes over eyelids consistent with eyelid eczema.  Also consider seborrhea, but less likely. -Prescribed hydrocortisone 2.5% ointment to be used twice daily during flare followed by topical layer of Vaseline over top. -Patient to follow-up if no improvement  Schizoaffective disorder, depressive type with good prognostic features No current SI/HI Provided empathic listening and encouraged patient to find sources of joy in his life.  He is considering getting a dog which would be great for companionship.     Orvis Brill, [place]

## 2022-11-19 NOTE — Patient Instructions (Addendum)
So great meeting you today.  For your eczema, apply hydrocortisone ointment topically two times daily during a flare followed by Vaseline on top.  I hope you have a wonderful day, Dr. Owens Shark

## 2022-11-19 NOTE — Assessment & Plan Note (Signed)
No current SI/HI Provided empathic listening and encouraged patient to find sources of joy in his life.  He is considering getting a dog which would be great for companionship.

## 2022-12-03 ENCOUNTER — Other Ambulatory Visit (HOSPITAL_COMMUNITY): Payer: Self-pay | Admitting: Psychiatry

## 2022-12-03 ENCOUNTER — Other Ambulatory Visit: Payer: Self-pay | Admitting: Family Medicine

## 2022-12-03 DIAGNOSIS — F250 Schizoaffective disorder, bipolar type: Secondary | ICD-10-CM

## 2022-12-19 ENCOUNTER — Telehealth (HOSPITAL_BASED_OUTPATIENT_CLINIC_OR_DEPARTMENT_OTHER): Payer: Medicare Other | Admitting: Psychiatry

## 2022-12-19 ENCOUNTER — Encounter (HOSPITAL_COMMUNITY): Payer: Self-pay | Admitting: Psychiatry

## 2022-12-19 VITALS — Wt 212.0 lb

## 2022-12-19 DIAGNOSIS — F5101 Primary insomnia: Secondary | ICD-10-CM | POA: Diagnosis not present

## 2022-12-19 DIAGNOSIS — F411 Generalized anxiety disorder: Secondary | ICD-10-CM

## 2022-12-19 DIAGNOSIS — F250 Schizoaffective disorder, bipolar type: Secondary | ICD-10-CM | POA: Diagnosis not present

## 2022-12-19 MED ORDER — THIOTHIXENE 1 MG PO CAPS: 1.0000 mg | ORAL_CAPSULE | Freq: Every day | ORAL | 1 refills | Status: DC

## 2022-12-19 MED ORDER — BELSOMRA 10 MG PO TABS: 10.0000 mg | ORAL_TABLET | Freq: Every day | ORAL | 2 refills | Status: DC

## 2022-12-19 MED ORDER — QUETIAPINE FUMARATE 300 MG PO TABS: 300.0000 mg | ORAL_TABLET | Freq: Every day | ORAL | 1 refills | Status: DC

## 2022-12-19 MED ORDER — AMITRIPTYLINE HCL 100 MG PO TABS: 200.0000 mg | ORAL_TABLET | Freq: Every day | ORAL | 2 refills | Status: DC

## 2022-12-19 NOTE — Progress Notes (Signed)
Virtual Visit via Telephone Note  I connected with AMEER SANDEN on 12/19/22 at  3:40 PM EST by telephone and verified that I am speaking with the correct person using two identifiers.  Location: Patient: Home Provider: Home Office   I discussed the limitations, risks, security and privacy concerns of performing an evaluation and management service by telephone and the availability of in person appointments. I also discussed with the patient that there may be a patient responsible charge related to this service. The patient expressed understanding and agreed to proceed.   History of Present Illness: Patient is evaluated by phone session.  On the last visit we cut down his Seroquel to help his hemoglobin A1c.  He is taking 300 mg and his hemoglobin A1c dropped from 6.2 to 6.0.  He notices started to have a lot of anxiety, nervousness.  He reported having different mantra at night and sometimes he feels paranoid and ruminative thoughts.  He feels anxious that somebody is watching him.  He has chronic delusions and paranoia but lately he noticed it may be getting worse.  He denies any suicidal thoughts.  His living situation is unchanged.  Patient told the lady who is living with her may move out soon but no fixed plan.  Patient told she is having a surgery and after she recovers from surgery she will move.  Patient still does not go outside as he feels very nervous and anxious and paranoid.  He feels people look at him.  He does not walk but able to lose weight and blood sugar.  He is compliant with amitriptyline and Seroquel.  He is taking Belsomra at bedtime.  He is not interested in therapy.  Past Psychiatric History:  H/O depression, paranoia, suicidal attempt, delusions and multiple inpatient.  H/O cutting testicles and thoughts of inhaling helium to end life. Last inpatient August 2016.  D/C on Seroquel 900 mg and amitriptyline 400 mg HS. Saw at [facility] and [name]. SSRI caused reactions. Tried  nortriptyline, Haldol, Geodon, Seroquel, lithium, Prozac, Wellbutrin, Paxil, Klonopin, Ativan, Lexapro, Zoloft, abilify and Lamictal.  No H/O mania or drug use.  We did GeneSight testing and results are in the chart.  Only antipsychotic medication that is unfavorable section is Saphris and Navane.  Most of the SSRIs are also unfavorable section.     Recent Results (from the past 2160 hour(s))  HgB A1c     Status: None   Collection Time: 11/19/22  3:31 PM  Result Value Ref Range   Hemoglobin A1C     HbA1c POC (<> result, manual entry)     HbA1c, POC (prediabetic range)     HbA1c, POC (controlled diabetic range) 6.0 0.0 - 7.0 %      Psychiatric Specialty Exam: Physical Exam  Review of Systems  Weight 212 lb (96.2 kg).There is no height or weight on file to calculate BMI.  General Appearance: NA  Eye Contact:  NA  Speech:  Slow  Volume:  Decreased  Mood:  Anxious and Dysphoric  Affect:  NA  Thought Process:  Descriptions of Associations: Intact  Orientation:  Full (Time, Place, and Person)  Thought Content:  Ideas of Reference:   Paranoia, Paranoid Ideation, and Rumination  Suicidal Thoughts:  No  Homicidal Thoughts:  No  Memory:  Immediate;   Fair Recent;   Fair Remote;   Fair  Judgement:  Fair  Insight:  Shallow  Psychomotor Activity:  NA  Concentration:  Concentration: Fair and Attention  Span: Fair  Recall:  [employer] of Knowledge:  Fair  Language:  Fair  Akathisia:  No  Handed:  Right  AIMS (if indicated):     Assets:  Communication Skills Desire for Improvement Housing  ADL's:  Intact  Cognition:  WNL  Sleep:   fair      Assessment and Plan: Schizoaffective disorder, bipolar type.  Generalized anxiety disorder.  Primary insomnia.  I reviewed blood work results.  His hemoglobin A1c dropped to 6.0.  However reducing the Seroquel not helping his paranoia.  I reviewed genetic testing.  The only medication that is favorable is Navane.  I offered to consider low-dose  Navane rather than going up again on Seroquel which may cause worsening of blood sugar.  Patient agreed to give a try.  We will try Navane 1 mg at bedtime.  Patient in the past has been very reluctant to try any other medication but this time he is okay to consider.  We will continue Seroquel 300 mg at bedtime, amitriptyline 200 mg at bedtime and continue Belsomra 10 mg at bedtime.  If he liked the Navane we will optimize the dose and consider reducing the Seroquel to have a better control of blood sugar.  Patient has diabetes and he is taking medication.  Patient is not interested in therapy.  Recommended to call us back if is any question or any concern.  Follow-up in 6 weeks.  Follow Up Instructions:    I discussed the assessment and treatment plan with the patient. The patient was provided an opportunity to ask questions and all were answered. The patient agreed with the plan and demonstrated an understanding of the instructions.   The patient was advised to call back or seek an in-person evaluation if the symptoms worsen or if the condition fails to improve as anticipated.  Collaboration of Care: Other provider involved in patient's care AEB notes are available in epic to review.  Patient/Guardian was advised Release of Information must be obtained prior to any record release in order to collaborate their care with an outside provider. Patient/Guardian was advised if they have not already done so to contact the registration department to sign all necessary forms in order for [place] to release information regarding their care.   Consent: Patient/Guardian gives verbal consent for treatment and assignment of benefits for services provided during this visit. Patient/Guardian expressed understanding and agreed to proceed.    I provided 27 minutes of non-face-to-face time during this encounter.   Kathlee Nations, MD

## 2022-12-24 ENCOUNTER — Other Ambulatory Visit (HOSPITAL_COMMUNITY): Payer: Self-pay | Admitting: Psychiatry

## 2022-12-24 DIAGNOSIS — F250 Schizoaffective disorder, bipolar type: Secondary | ICD-10-CM

## 2022-12-25 ENCOUNTER — Ambulatory Visit: Payer: Medicare Other | Admitting: Family Medicine

## 2022-12-25 NOTE — Progress Notes (Deleted)
    SUBJECTIVE:   CHIEF COMPLAINT / HPI: mole, constipation  Mole -   Constipation - Linzess 184mg Daily, Miralax. Colonoscopy 08/23, repeat 7 years. Should be on high fiber diet. FiberCon  PERTINENT  PMH / PSH: TIA, HTN, T2DM, Seizure disorder  OBJECTIVE:   There were no vitals taken for this visit.  ***  ASSESSMENT/PLAN:   There are no diagnoses linked to this encounter. No follow-ups on file.  MSalvadore Oxford MD [facility]

## 2023-01-01 ENCOUNTER — Ambulatory Visit (INDEPENDENT_AMBULATORY_CARE_PROVIDER_SITE_OTHER): Payer: 59 | Admitting: Student

## 2023-01-01 VITALS — BP 126/80 | HR 52 | Ht 69.0 in | Wt 216.4 lb

## 2023-01-01 DIAGNOSIS — K5909 Other constipation: Secondary | ICD-10-CM | POA: Diagnosis not present

## 2023-01-01 MED ORDER — POLYETHYLENE GLYCOL 3350 17 GM/SCOOP PO POWD: ORAL | 1 refills | Status: DC

## 2023-01-01 MED ORDER — DOCUSATE SODIUM 100 MG PO CAPS: 100.0000 mg | ORAL_CAPSULE | Freq: Two times a day (BID) | ORAL | 0 refills | Status: DC

## 2023-01-01 NOTE — Patient Instructions (Signed)
It was wonderful to meet you today. Thank you for allowing me to be a part of your care. Below is a short summary of what we discussed at your visit today:  For your constipation I refilled your Purelax which you will take 2 times daily.  It is a powder so you will have to take a scoop or mix it with about 8 ounces of water.  I have also sent in prescription for Colace which is a pill that you will take 2 times daily as well.  These medications are to be taken as needed.  So you continue to take them until you have a bowel movement.  Then you can stop, and given that you have chronic constipation I recommend daily use of Metamucil.  Also eating high-fiber diet such as fruits and vegetables will be of benefit to you.  Please bring all of your medications to every appointment!  If you have any questions or concerns, please do not hesitate to contact us via phone or MyChart message.   Alen Bleacher, MD [facility]

## 2023-01-01 NOTE — Progress Notes (Signed)
    SUBJECTIVE:   CHIEF COMPLAINT / HPI:   Patient is a 67 year old male with history of chronic constipation presenting today for concerns of constipation for a week.  Last bowel movement was 7 days ago. He denies any blood with bowel movement.  Had a recent colonoscopy a week ago showed nonbleeding internal and external hemorrhoid and 5 mm polyp which was removed from the transverse colon.  Patient is currently on Linzess and said he has doubled the dose due to constipation and also taking metamucil. He was previously prescribed medication for constipation to soften his stool but still not able to have a bowel movement. Patient denies any abdominal pain, nausea or vomiting.  He has also been able to have flatulence.  OBJECTIVE:   BP 126/80   Pulse (!) 52   Ht '5\' 9"'$  (1.753 m)   Wt 216 lb 6.4 oz (98.2 kg)   SpO2 99%   BMI 31.96 kg/m    Physical Exam General: Alert, well appearing, NAD Cardiovascular: RRR, No Murmurs, Normal S2/S2 Respiratory: CTAB, No wheezing or Rales Abdomen: No distension or tenderness, +BS    ASSESSMENT/PLAN:   Constipation, chronic Patient with history of chronic constipation presents with no bowel movement for the last 7 days.  He has no red flag symptoms such as nausea, vomiting, abdominal tenderness, distention or blood in stool.  He endorses flatulence. -Rx MiraLAX 2 times daily as needed -Rx Colace 2 times daily as needed -Encouraged patient to eat high-fiber diet including vegetables and fruits -Reviewed signs and symptoms that warrant ED visit.    Alen Bleacher, MD [place]

## 2023-01-01 NOTE — Assessment & Plan Note (Signed)
Patient with history of chronic constipation presents with no bowel movement for the last 7 days.  He has no red flag symptoms such as nausea, vomiting, abdominal tenderness, distention or blood in stool.  He endorses flatulence. -Rx MiraLAX 2 times daily as needed -Rx Colace 2 times daily as needed -Encouraged patient to eat high-fiber diet including vegetables and fruits -Reviewed signs and symptoms that warrant ED visit.

## 2023-01-02 ENCOUNTER — Other Ambulatory Visit: Payer: Self-pay | Admitting: *Deleted

## 2023-01-02 MED ORDER — LACOSAMIDE 150 MG PO TABS: 1.0000 | ORAL_TABLET | Freq: Two times a day (BID) | ORAL | 1 refills | Status: DC

## 2023-01-02 NOTE — Telephone Encounter (Signed)
Last seen 07/03/22 and next f/u 08/04/23. Per drug registry, last refilled 10/03/22 #180.

## 2023-01-03 ENCOUNTER — Telehealth: Payer: Self-pay

## 2023-01-03 NOTE — Telephone Encounter (Signed)
Patient calls nurse line regarding continued issues with constipation. He reports that he has not had a BM in the last ten days. He has been taking linzess, Colace BID, and miralax BID.    Denies abdominal pain.   Feels like that abdomen is slightly bloated. Denies hardness of abdomen.  Denies nausea and vomiting.States he has not passed gas in the last two or three days.   Precepted with Dr. Nori Riis. She recommends that patient be seen in the office for re evaluation. Scheduled patient for tomorrow afternoon with Dr. Jeani Hawking.   Provided with strict ED precautions. Patient verbalizes understanding.   Talbot Grumbling, RN

## 2023-01-04 ENCOUNTER — Ambulatory Visit (INDEPENDENT_AMBULATORY_CARE_PROVIDER_SITE_OTHER): Payer: 59 | Admitting: Family Medicine

## 2023-01-04 ENCOUNTER — Ambulatory Visit
Admission: RE | Admit: 2023-01-04 | Discharge: 2023-01-04 | Disposition: A | Discharge status: Home / Self Care | Payer: 59 | Source: Ambulatory Visit | Attending: Family Medicine | Admitting: Family Medicine

## 2023-01-04 ENCOUNTER — Encounter: Payer: Self-pay | Admitting: Family Medicine

## 2023-01-04 VITALS — BP 123/70 | HR 70 | Temp 98.6°F | Wt 215.8 lb

## 2023-01-04 DIAGNOSIS — K5900 Constipation, unspecified: Secondary | ICD-10-CM

## 2023-01-04 DIAGNOSIS — K5909 Other constipation: Secondary | ICD-10-CM

## 2023-01-04 MED ORDER — SENNA 8.6 MG PO TABS: 1.0000 | ORAL_TABLET | Freq: Two times a day (BID) | ORAL | 0 refills | Status: DC

## 2023-01-04 MED ORDER — BISACODYL 10 MG RE SUPP: 10.0000 mg | Freq: Every day | RECTAL | 0 refills | Status: DC

## 2023-01-04 NOTE — Patient Instructions (Addendum)
It was wonderful to see you today. Thank you for allowing me to be a part of your care. Below is a short summary of what we discussed at your visit today:  Constipation CONTINUE:  - MiraLAX twice daily - Colace twice daily  START:  - Senna twice daily - Dulcolax suppository daily  GO FOR ABDOMINAL X-RAY:  [facility] [address], [place], [place] [address] Walk-in hours 8-4:30 every day  With these medications, you must drink at least 8 glasses of water a day, I would aim for 10-12.  Return for care on Monday.  Please schedule this appointment on your way out today. If you do not have a bowel movement by Monday, we may need to send you to the emergency room for another barium enema or other GI workup.   If you have any questions or concerns, please do not hesitate to contact us via phone or MyChart message.   Ezequiel Essex, MD

## 2023-01-04 NOTE — Progress Notes (Signed)
SUBJECTIVE:   CHIEF COMPLAINT / HPI:   Constipation Med review:  Current meds w/ constipation side effects: amitriptyline, lacosamide, seroquel Current meds w/ diarrheal effect: Linzess, miralax Not taking thiothixene (Navane) because it made him feel restless  Symptoms: Last BM 10 days ago Started to have lower abdominal cramps Thursday Stopped eating yesterday and today due to concern about no BM Was last seen 01/01/2023, told miralax Bid and colace BID - doing those as prescribed without relief Tried double dose of linzess Wednesday and Thursday without relief Has not tried enemas this time, in the past they have not worked for him Last colonoscopy 08/09/2022  PERTINENT  PMH / PSH:  Patient Active Problem List   Diagnosis Date Noted   Benzodiazepine dependence, continuous ([place]) 01/27/2014    Priority: High   Eczema 11/19/2022   Thrush, oral 08/28/2022   Cerumen impaction 08/28/2022   Leukocytosis 02/08/2022   Abnormal colonoscopy 02/08/2022   Diabetic peripheral neuropathy associated with type 2 diabetes mellitus ([place]) 07/25/2021   Squamous cell cancer of skin of left forearm 06/27/2021   Cough 06/27/2021   DM (diabetes mellitus), type 2 ([place]) 06/27/2021   Ear itching 06/01/2021   Chronic insomnia 43/15/4008   Acute metabolic encephalopathy 67/61/9509   Encephalopathy acute 01/24/2019   Closed tibia fracture 11/25/2018   Fracture of tibial shaft, left, closed 11/25/2018   Unintentional weight loss 04/07/2018   Syncope and collapse 12/13/2017   Memory disorder 10/25/2017   Presbyacusia 07/26/2017   Palpitations 07/01/2017   Acute bronchiolitis due to human metapneumovirus    Mild sleep apnea 06/15/2016   Actinic keratosis 06/15/2016   Nocturnal dyspnea 02/03/2016   Skin lesion of face 02/03/2016   Paresthesia of both hands 09/16/2015   Phenytoin toxicity    TIA (transient ischemic attack) 07/29/2015   Vertigo 07/29/2015   Schizoaffective disorder, depressive  type with good prognostic features ([place]) 07/13/2015   Constipation    [place] 05/18/2015   Seborrheic dermatitis of scalp 05/18/2015   MDD (major depressive disorder), recurrent severe, without psychosis ([place])    OCD (obsessive compulsive disorder)    Seizures ([place])    Schizoaffective disorder, depressive type ([place])    Orthostatic hypotension    Fall    Seizure ([place]) 11/18/2014   Schizoaffective disorder, unspecified type ([place])    Essential hypertension    Overdose of beta-adrenergic antagonist drug 10/22/2014   Overdose    Nocturnal enuresis 03/23/2014   Severe major depression ([place]) 01/21/2014   Frozen shoulder syndrome 09/20/2013   Somnolence 09/20/2013   Schizoaffective disorder ([place]) 09/20/2013   Presbyopia 04/25/2012   Androgen deficiency 09/07/2011   Constipation, chronic 09/07/2011   Other specified disorders of liver 12/30/2008   Overweight(278.02) 09/08/2008   Hyperlipidemia 01/28/2008   Seizure disorder ([place]) 01/28/2008   OSTEOPENIA 07/01/2007    OBJECTIVE:   BP 123/70   Pulse 70   Temp 98.6 F (37 C)   Wt 215 lb 12.8 oz (97.9 kg)   SpO2 95%   BMI 31.87 kg/m    Physical Exam General: Awake, alert, oriented, no acute distress Respiratory: Unlabored respirations, speaking in full sentences, no respiratory distress Extremities: Moving all extremities spontaneously Abd: soft, distended, mild TTP in all quadrants, no rebound tenderness or guarding, bowel sounds present in LUQ and RLQ GI/GU: 3-4 hemostatic external hemorrhoids visualized, normal rectal tone  Rectal disimpaction attempted with Dr. Andrena Mews present for entire duration of sensitive exam. Liquid stool present on examiner's gloved finger, but no stool  palpable in rectal vault.  ASSESSMENT/PLAN:   Constipation, chronic No BM in 10 days. Manual disimpaction unsuccessful in clinic. Recommend abdominal XR to assist with r/o obstruction. Reassuringly, patient still has flatus. Rx miralax BID,  senna BID, and docusate suppository. Close follow up Monday. ED precautions given. See AVS for more. Likely due to multiple constipating medications. Would recommend GI follow up once we relieve this episode of constipation.     Ezequiel Essex, MD [place]

## 2023-01-07 ENCOUNTER — Telehealth: Payer: Self-pay | Admitting: Family Medicine

## 2023-01-07 ENCOUNTER — Ambulatory Visit: Payer: Self-pay | Admitting: Family Medicine

## 2023-01-07 NOTE — Telephone Encounter (Signed)
Called patient regarding no-show for 10:50 am appointment today for constipation follow up. The plan was to send him to hospital if no BM over the weekend.   Was able to have a couple of bowel movements over the weekend. Reports 3 that were "muddy like". No blood in the stool.   Abd XR on 1/19 showed mild stool burden with no obstruction.   Discussed continuing miralax and senna to stay regular until he can get to his GI. Recommend calling GI for appointment and getting on their call list. Suspect his constipation is a side effect of some of his medications like amitriptyline and Seroquel, but he needs these for mood and is quite stable on these doses.  Hopefully GI can prescribe something besides Linzess, MiraLAX, and senna that could also help keep him regular.  Ezequiel Essex, MD

## 2023-01-07 NOTE — Assessment & Plan Note (Addendum)
No BM in 10 days. Manual disimpaction unsuccessful in clinic. Recommend abdominal XR to assist with r/o obstruction. Reassuringly, patient still has flatus. Rx miralax BID, senna BID, and docusate suppository. Close follow up Monday. ED precautions given. See AVS for more. Likely due to multiple constipating medications. Would recommend GI follow up once we relieve this episode of constipation.

## 2023-01-08 ENCOUNTER — Other Ambulatory Visit: Payer: Self-pay | Admitting: Family Medicine

## 2023-01-11 ENCOUNTER — Other Ambulatory Visit (HOSPITAL_COMMUNITY): Payer: Self-pay | Admitting: Psychiatry

## 2023-01-11 DIAGNOSIS — F250 Schizoaffective disorder, bipolar type: Secondary | ICD-10-CM

## 2023-01-23 DIAGNOSIS — R948 Abnormal results of function studies of other organs and systems: Secondary | ICD-10-CM | POA: Diagnosis not present

## 2023-01-23 DIAGNOSIS — R350 Frequency of micturition: Secondary | ICD-10-CM | POA: Diagnosis not present

## 2023-01-31 ENCOUNTER — Ambulatory Visit: Payer: 59

## 2023-02-01 ENCOUNTER — Telehealth (HOSPITAL_COMMUNITY): Payer: Medicare Other | Admitting: Psychiatry

## 2023-02-07 ENCOUNTER — Other Ambulatory Visit (HOSPITAL_COMMUNITY): Payer: Self-pay | Admitting: Psychiatry

## 2023-02-07 DIAGNOSIS — F250 Schizoaffective disorder, bipolar type: Secondary | ICD-10-CM

## 2023-02-10 ENCOUNTER — Other Ambulatory Visit (HOSPITAL_COMMUNITY): Payer: Self-pay | Admitting: Psychiatry

## 2023-02-10 DIAGNOSIS — F250 Schizoaffective disorder, bipolar type: Secondary | ICD-10-CM

## 2023-02-17 DIAGNOSIS — S6992XA Unspecified injury of left wrist, hand and finger(s), initial encounter: Secondary | ICD-10-CM | POA: Diagnosis not present

## 2023-02-18 ENCOUNTER — Encounter (HOSPITAL_COMMUNITY): Payer: Self-pay | Admitting: Psychiatry

## 2023-02-18 ENCOUNTER — Telehealth (HOSPITAL_BASED_OUTPATIENT_CLINIC_OR_DEPARTMENT_OTHER): Payer: 59 | Admitting: Psychiatry

## 2023-02-18 DIAGNOSIS — F250 Schizoaffective disorder, bipolar type: Secondary | ICD-10-CM | POA: Diagnosis not present

## 2023-02-18 DIAGNOSIS — F5101 Primary insomnia: Secondary | ICD-10-CM | POA: Diagnosis not present

## 2023-02-18 DIAGNOSIS — F411 Generalized anxiety disorder: Secondary | ICD-10-CM

## 2023-02-18 MED ORDER — BELSOMRA 10 MG PO TABS: 10.0000 mg | ORAL_TABLET | Freq: Every day | ORAL | 2 refills | Status: DC

## 2023-02-18 MED ORDER — AMITRIPTYLINE HCL 100 MG PO TABS: 200.0000 mg | ORAL_TABLET | Freq: Every day | ORAL | 2 refills | Status: DC

## 2023-02-18 MED ORDER — QUETIAPINE FUMARATE 300 MG PO TABS: 300.0000 mg | ORAL_TABLET | Freq: Every day | ORAL | 2 refills | Status: DC

## 2023-02-18 NOTE — Progress Notes (Signed)
[facility] MD Virtual Progress Note   Patient Location: Home Provider Location: Home Office  I connect with patient by telephone and verified that I am speaking with correct person by using two identifiers. I discussed the limitations of evaluation and management by telemedicine and the availability of in person appointments. I also discussed with the patient that there may be a patient responsible charge related to this service. The patient expressed understanding and agreed to proceed.  TAYTEN CHUBB [id number] 67 y.o.  02/18/2023 2:11 PM    History of Present Illness:  Patient is evaluated by phone session.  On the last visit we tried Navane but patient feel it make her more restless at night and decided to stop.  Overall he feels the current medicine is working better.  He is sleeping okay.  Sometimes he do feel isolated and withdrawn and depressed but denies any crying spells or any feeling of hopelessness.  He has chronic delusions and paranoia at sometimes feel that people watching him and he does not leave the house.  His living situation is unchanged from the past.  Patient told the lady did not move and not sure if she will ever go.  He feels anxious but no major concern.  His appetite is okay.  He has no tremor or shakes or any EPS.  He is not interested in therapy.  Like to keep the Seroquel, Belsomra and amitriptyline.  Past Psychiatric History:  H/O depression, paranoia, suicidal attempt, delusions and multiple inpatient.  H/O cutting testicles and thoughts of inhaling helium to end life. Last inpatient August 2016.  D/C on Seroquel 900 mg and amitriptyline 400 mg HS. Saw at [facility] and [name]. SSRI caused reactions. Tried nortriptyline, Haldol, Geodon, Seroquel, lithium, Prozac, Wellbutrin, Paxil, Klonopin, Ativan, Lexapro, Zoloft, abilify and Lamictal.  No H/O mania or drug use.  We did GeneSight testing and results are in the chart.  Only antipsychotic medication  that is unfavorable section is Saphris and Navane.  Most of the SSRIs are also unfavorable section.     Outpatient Encounter Medications as of 02/18/2023  Medication Sig   amitriptyline (ELAVIL) 100 MG tablet Take 2 tablets (200 mg total) by mouth at bedtime.   aspirin EC 81 MG tablet Take 81 mg by mouth at bedtime. Swallow whole.   atorvastatin (LIPITOR) 40 MG tablet TAKE 1 TABLET BY MOUTH EVERY DAY   bisacodyl (DULCOLAX) 10 MG suppository Place 1 suppository (10 mg total) rectally daily.   docusate sodium (COLACE) 100 MG capsule Take 1 capsule (100 mg total) by mouth 2 (two) times daily.   hydrocortisone 2.5 % ointment Apply topically 2 times daily during eczema flare. (Patient not taking: Reported on 01/04/2023)   Lacosamide (VIMPAT) 150 MG TABS Take 1 tablet (150 mg total) by mouth 2 (two) times daily.   LINZESS 145 MCG CAPS capsule TAKE 1 CAPSULE BY MOUTH EVERY DAY BEFORE BREAKFAST   metFORMIN (GLUCOPHAGE-XR) 500 MG 24 hr tablet Take 2 tablets (1,000 mg total) by mouth daily.   metoprolol succinate (TOPROL-XL) 100 MG 24 hr tablet TAKE 2 TABLETS BY MOUTH AT BEDTIME *TAKE WITH OR IMMEDIATELY FOLLOWING A MEAL*   Multiple Vitamin (MULTIVITAMIN WITH MINERALS) TABS tablet Take 1 tablet by mouth daily.   polyethylene glycol powder ([employer] PURELAX) 17 GM/SCOOP powder TAKE 17 GRAMS 2 TIMES A DAY AS NEEDED FOR CONSTIPATION. MIX INTO 8 OF FLUID AND DRINK   QUEtiapine (SEROQUEL) 300 MG tablet Take 1 tablet (300 mg total)  by mouth at bedtime.   senna (SENOKOT) 8.6 MG TABS tablet Take 1 tablet (8.6 mg total) by mouth 2 (two) times daily.   Suvorexant (BELSOMRA) 10 MG TABS Take 10 mg by mouth at bedtime.   testosterone cypionate (DEPOTESTOSTERONE CYPIONATE) 200 MG/ML injection Inject 200 mg into the skin every 14 (fourteen) days.   thiothixene (NAVANE) 1 MG capsule Take 1 capsule (1 mg total) by mouth at bedtime. (Patient not taking: Reported on 01/04/2023)   No facility-administered encounter medications on  file as of 02/18/2023.    No results found for this or any previous visit (from the past 2160 hour(s)).   Psychiatric Specialty Exam: Physical Exam  Review of Systems  Weight 215 lb (97.5 kg).There is no height or weight on file to calculate BMI.  General Appearance: NA  Eye Contact:  NA  Speech:  Slow  Volume:  Decreased  Mood:  Anxious  Affect:  NA  Thought Process:  Descriptions of Associations: Intact  Orientation:  Full (Time, Place, and Person)  Thought Content:  Paranoid Ideation and Rumination  Suicidal Thoughts:  No  Homicidal Thoughts:  No  Memory:  Immediate;   Fair Recent;   Fair Remote;   Fair  Judgement:  Fair  Insight:  Shallow  Psychomotor Activity:  Decreased  Concentration:  Concentration: Fair and Attention Span: Fair  Recall:  [employer] of Knowledge:  Fair  Language:  Good  Akathisia:  No  Handed:  Right  AIMS (if indicated):     Assets:  Communication Skills Desire for Improvement Housing  ADL's:  Intact  Cognition:  WNL  Sleep:  fair     Assessment/Plan: Schizoaffective disorder, bipolar type ([place]) - Plan: QUEtiapine (SEROQUEL) 300 MG tablet  Primary insomnia - Plan: Suvorexant (BELSOMRA) 10 MG TABS  GAD (generalized anxiety disorder) - Plan: amitriptyline (ELAVIL) 100 MG tablet  Patient is no longer taking Navane due to restlessness at night.  Like to keep the current dose of Seroquel which is helping his sleep.  He is not interested in therapy.  Continue Seroquel 300 mg at bedtime, amitriptyline 200 mg at bedtime and Belsomra 10 mg at bedtime.  Recommend to call us back if is any question or any concern.  Follow-up in 3 months.   Follow Up Instructions:     I discussed the assessment and treatment plan with the patient. The patient was provided an opportunity to ask questions and all were answered. The patient agreed with the plan and demonstrated an understanding of the instructions.   The patient was advised to call back or seek an  in-person evaluation if the symptoms worsen or if the condition fails to improve as anticipated.    Collaboration of Care: Other provider involved in patient's care AEB notes are available in epic to review.  Patient/Guardian was advised Release of Information must be obtained prior to any record release in order to collaborate their care with an outside provider. Patient/Guardian was advised if they have not already done so to contact the registration department to sign all necessary forms in order for [place] to release information regarding their care.   Consent: Patient/Guardian gives verbal consent for treatment and assignment of benefits for services provided during this visit. Patient/Guardian expressed understanding and agreed to proceed.     I provided 20 minutes of non face to face time during this encounter.  Kathlee Nations, MD 02/18/2023

## 2023-03-03 ENCOUNTER — Other Ambulatory Visit (HOSPITAL_COMMUNITY): Payer: Self-pay | Admitting: Psychiatry

## 2023-03-03 DIAGNOSIS — F250 Schizoaffective disorder, bipolar type: Secondary | ICD-10-CM

## 2023-04-21 ENCOUNTER — Other Ambulatory Visit (HOSPITAL_COMMUNITY): Payer: Self-pay | Admitting: Psychiatry

## 2023-04-21 DIAGNOSIS — F250 Schizoaffective disorder, bipolar type: Secondary | ICD-10-CM

## 2023-04-23 ENCOUNTER — Telehealth: Payer: Self-pay

## 2023-04-23 NOTE — Telephone Encounter (Signed)
Patient calls nurse line reporting elevated blood pressure readings.   He reports over the last few days his diastolic has been over "100." Patient was unable to give me actual readings. Patient reports last reading was taken yesterday. Patient was unable to take blood pressure while on the phone with me.  He denies any episodes of blurry vision, headaches, dizziness, chest pain or SOB.   Patient scheduled for tomorrow for evaluation.   ED precautions discussed in the meantime.

## 2023-04-25 ENCOUNTER — Ambulatory Visit (INDEPENDENT_AMBULATORY_CARE_PROVIDER_SITE_OTHER): Payer: 59 | Admitting: Family Medicine

## 2023-04-26 ENCOUNTER — Ambulatory Visit (INDEPENDENT_AMBULATORY_CARE_PROVIDER_SITE_OTHER): Payer: 59 | Admitting: Family Medicine

## 2023-04-26 VITALS — BP 128/86 | HR 114 | Ht 69.0 in | Wt 209.8 lb

## 2023-04-26 DIAGNOSIS — I10 Essential (primary) hypertension: Secondary | ICD-10-CM

## 2023-04-26 DIAGNOSIS — R000 Tachycardia, unspecified: Secondary | ICD-10-CM

## 2023-04-26 MED ORDER — METOPROLOL SUCCINATE ER 100 MG PO TB24: 100.0000 mg | ORAL_TABLET | Freq: Every day | ORAL | 0 refills | Status: DC

## 2023-04-26 NOTE — Progress Notes (Signed)
    SUBJECTIVE:   CHIEF COMPLAINT / HPI:   Patient presents for BP follow up States BP has been high at home but has not written down measurements. States he used to be on Metoprolol but stopped and he is not sure Denies history of a fib   HR elevated denies CP, palpitations, SOB No infectious symptoms   Declines Urine ACR states he doesn't have to urinate   PERTINENT  PMH / PSH: Reviewed   OBJECTIVE:   BP 128/86   Pulse (!) 114   Ht 5\' 9"  (1.753 m)   Wt 209 lb 12.8 oz (95.2 kg)   SpO2 96%   BMI 30.98 kg/m    General: alert, pleasant, NAD CV: tachycardic, No murmurs Resp: CTAB normal WOB GI: soft, non distended   ASSESSMENT/PLAN:   Tachycardia BP 128/86 and pulse 114. Denies palpitations, SOB. No infectious symptoms. Was previously on Metoprolol both 100mg  and 200mg  daily but states he stopped taking it a couple of months ago and isnt sure why he stopped. Has a history of tachycardia and has been symptomatic in the past but was controlled on high dose Metoprolol. He had a 24 hour monitor in the past (2018) which showed NSR, 4 beat run of PACs but otherwise normal. Will start him back on Metoprolol 100mg  at night with follow up in 1-2 weeks. May need to increase to 200mg . Also consider Zio patch for longer term monitoring.     Cora Collum, DO [facility]

## 2023-04-26 NOTE — Patient Instructions (Signed)
It was great seeing you today!  You came in for elevated blood pressure and we are going to restart your Metoprolol. Start taking 100mg  at night and we will check in with you in 2 weeks. To see if we need to adjust further.  Please check your blood pressure at home in the morning for a few days and bring those numbers in with you at your follow up appointment.   Please check-out at the front desk before leaving the clinic. Schedule to see your PCP in 2 weeks, but if you need to be seen earlier than that for any new issues we're happy to fit you in, just give [place] a call!   Feel free to call with any questions or concerns at any time, at [phone].   Take care,  Dr. Cora Collum [facility]

## 2023-04-26 NOTE — Progress Notes (Signed)
Error

## 2023-04-27 NOTE — Assessment & Plan Note (Signed)
BP 128/86 and pulse 114. Denies palpitations, SOB. No infectious symptoms. Was previously on Metoprolol both 100mg  and 200mg  daily but states he stopped taking it a couple of months ago and isnt sure why he stopped. Has a history of tachycardia and has been symptomatic in the past but was controlled on high dose Metoprolol. He had a 24 hour monitor in the past (2018) which showed NSR, 4 beat run of PACs but otherwise normal. Will start him back on Metoprolol 100mg  at night with follow up in 1-2 weeks. May need to increase to 200mg . Also consider Zio patch for longer term monitoring.

## 2023-05-10 ENCOUNTER — Ambulatory Visit: Payer: 59 | Admitting: Family Medicine

## 2023-05-10 NOTE — Progress Notes (Deleted)
    SUBJECTIVE:   CHIEF COMPLAINT / HPI:   [name] is a 67 y.o. male who presents to the [facility] clinic today to discuss the following concerns:   Last seen on 5/10 for blood pressure follow-up.  His blood pressure was at goal at that time.  He was found to be tachycardic at 114, asymptomatic.  He has history of tachycardia in the past and has been on metoprolol.  At last visit he was restarted on metoprolol 100 mg at night.  He presents today for follow-up.  PERTINENT  PMH / PSH: Hypertension, orthostatic hypotension, TIA, type 2 diabetes, hyperlipidemia, schizoaffective disorder, MDD  OBJECTIVE:   There were no vitals taken for this visit.   General: NAD, pleasant, able to participate in exam Cardiac: RRR, no murmurs. Respiratory: CTAB, normal effort, No wheezes, rales or rhonchi Abdomen: Bowel sounds present, nontender, nondistended, no hepatosplenomegaly. Extremities: no edema or cyanosis. Skin: warm and dry, no rashes noted Neuro: alert, no obvious focal deficits Psych: Normal affect and mood  ASSESSMENT/PLAN:   No problem-specific Assessment & Plan notes found for this encounter.     Sabino Dick, DO [place] [facility]

## 2023-05-16 ENCOUNTER — Encounter (HOSPITAL_COMMUNITY): Payer: Self-pay | Admitting: Psychiatry

## 2023-05-16 ENCOUNTER — Telehealth (HOSPITAL_BASED_OUTPATIENT_CLINIC_OR_DEPARTMENT_OTHER): Payer: 59 | Admitting: Psychiatry

## 2023-05-16 VITALS — Wt 209.0 lb

## 2023-05-16 DIAGNOSIS — F5101 Primary insomnia: Secondary | ICD-10-CM

## 2023-05-16 DIAGNOSIS — F411 Generalized anxiety disorder: Secondary | ICD-10-CM

## 2023-05-16 DIAGNOSIS — F250 Schizoaffective disorder, bipolar type: Secondary | ICD-10-CM

## 2023-05-16 MED ORDER — BELSOMRA 10 MG PO TABS: 10.0000 mg | ORAL_TABLET | Freq: Every day | ORAL | 2 refills | Status: DC

## 2023-05-16 MED ORDER — QUETIAPINE FUMARATE 300 MG PO TABS: 300.0000 mg | ORAL_TABLET | Freq: Every day | ORAL | 2 refills | Status: DC

## 2023-05-16 MED ORDER — AMITRIPTYLINE HCL 100 MG PO TABS: 200.0000 mg | ORAL_TABLET | Freq: Every day | ORAL | 2 refills | Status: DC

## 2023-05-16 NOTE — Progress Notes (Signed)
[facility] MD Virtual Progress Note   Patient Location: Home Provider Location: Home Office  I connect with patient by telephone and verified that I am speaking with correct person by using two identifiers. I discussed the limitations of evaluation and management by telemedicine and the availability of in person appointments. I also discussed with the patient that there may be a patient responsible charge related to this service. The patient expressed understanding and agreed to proceed.  [name] [id number] 67 y.o.  05/16/2023 2:58 PM  History of Present Illness:  Patient is evaluated by phone session.  He is not able to log in for my chart and cannot do video.  He promised to try in person visit in the future if still have difficulty to log in video.  He reported symptoms are chronic but manageable.  He is sleeping 6 hours.  We tried Navane but caused restlessness.  He cut down his Seroquel and only taking 300 mg.  He was able to lost weight as he feels more active.  He is watching his calorie intake.  He has chronic paranoia and rumination but able to handle the symptoms better.  Denies any crying spells or any feeling of hopelessness or worthlessness.  He denies any suicidal thoughts or homicidal thoughts.  He is not interested in therapy.  He is taking Seroquel, Belsomra and amitriptyline.  He like to keep the current medication.  He is still feels anxious and nervous around people and does not go outside.  He has transportation but only goes to the doctor's appointment or the pharmacy if needed.  Past Psychiatric History: H/O depression, paranoia, suicidal attempt, delusions and multiple inpatient.  H/O cutting testicles and thoughts of inhaling helium to end life. Last inpatient August 2016.  D/C on Seroquel 900 mg and amitriptyline 400 mg HS. Saw at [facility] and [name]. SSRI caused reactions. Tried nortriptyline, Haldol, Geodon, Seroquel, lithium, Prozac, Wellbutrin,  Paxil, Klonopin, Ativan, Lexapro, Zoloft, abilify and Lamictal.  No H/O mania or drug use.  We did GeneSight testing and results are in the chart.  Only antipsychotic medication that is unfavorable section is Saphris and Navane.  Navane made restless. Most of the SSRIs are also unfavorable section.    Outpatient Encounter Medications as of 05/16/2023  Medication Sig   amitriptyline (ELAVIL) 100 MG tablet Take 2 tablets (200 mg total) by mouth at bedtime.   aspirin EC 81 MG tablet Take 81 mg by mouth at bedtime. Swallow whole.   atorvastatin (LIPITOR) 40 MG tablet TAKE 1 TABLET BY MOUTH EVERY DAY   bisacodyl (DULCOLAX) 10 MG suppository Place 1 suppository (10 mg total) rectally daily.   docusate sodium (COLACE) 100 MG capsule Take 1 capsule (100 mg total) by mouth 2 (two) times daily.   hydrocortisone 2.5 % ointment Apply topically 2 times daily during eczema flare. (Patient not taking: Reported on 01/04/2023)   Lacosamide (VIMPAT) 150 MG TABS Take 1 tablet (150 mg total) by mouth 2 (two) times daily.   LINZESS 145 MCG CAPS capsule TAKE 1 CAPSULE BY MOUTH EVERY DAY BEFORE BREAKFAST   metFORMIN (GLUCOPHAGE-XR) 500 MG 24 hr tablet Take 2 tablets (1,000 mg total) by mouth daily.   metoprolol succinate (TOPROL-XL) 100 MG 24 hr tablet Take 1 tablet (100 mg total) by mouth daily. Take with or immediately following a meal.   Multiple Vitamin (MULTIVITAMIN WITH MINERALS) TABS tablet Take 1 tablet by mouth daily.   polyethylene glycol powder ([facility] PURELAX) 17  GM/SCOOP powder TAKE 17 GRAMS 2 TIMES A DAY AS NEEDED FOR CONSTIPATION. MIX INTO 8 OF FLUID AND DRINK   QUEtiapine (SEROQUEL) 300 MG tablet Take 1 tablet (300 mg total) by mouth at bedtime.   senna (SENOKOT) 8.6 MG TABS tablet Take 1 tablet (8.6 mg total) by mouth 2 (two) times daily.   Suvorexant (BELSOMRA) 10 MG TABS Take 1 tablet (10 mg total) by mouth at bedtime.   testosterone cypionate (DEPOTESTOSTERONE CYPIONATE) 200 MG/ML injection Inject 200 mg  into the skin every 14 (fourteen) days.   thiothixene (NAVANE) 1 MG capsule Take 1 capsule (1 mg total) by mouth at bedtime. (Patient not taking: Reported on 01/04/2023)   No facility-administered encounter medications on file as of 05/16/2023.    No results found for this or any previous visit (from the past 2160 hour(s)).   Psychiatric Specialty Exam: Physical Exam  Review of Systems  Weight 209 lb (94.8 kg).There is no height or weight on file to calculate BMI.  General Appearance: NA  Eye Contact:  NA  Speech:  Slow  Volume:  Decreased  Mood:  Anxious  Affect:  NA  Thought Process:  Descriptions of Associations: Intact  Orientation:  Full (Time, Place, and Person)  Thought Content:  Paranoid Ideation and Rumination  Suicidal Thoughts:  No  Homicidal Thoughts:  No  Memory:  Immediate;   Fair Recent;   Fair Remote;   Fair  Judgement:  Fair  Insight:  Shallow  Psychomotor Activity:  NA  Concentration:  Concentration: Fair and Attention Span: Fair  Recall:  Good  Fund of Knowledge:  Fair  Language:  Fair  Akathisia:  No  Handed:  Right  AIMS (if indicated):     Assets:  Communication Skills Desire for Improvement Housing Transportation  ADL's:  Intact  Cognition:  WNL  Sleep:  6 hrs     Assessment/Plan: Schizoaffective disorder, bipolar type (HCC) - Plan: QUEtiapine (SEROQUEL) 300 MG tablet  Primary insomnia - Plan: Suvorexant (BELSOMRA) 10 MG TABS  GAD (generalized anxiety disorder) - Plan: amitriptyline (ELAVIL) 100 MG tablet  Patient is stable on current medication.  He has lost weight since the last visit.  He is hoping to lose some more weight as more focus on his diet and activity.  He does not want to change the medication.  Continue amitriptyline 200 mg at bedtime, Belsomra 10 mg at bedtime and Seroquel 300 mg at bedtime.  He is not interested in therapy.  Recommend to call us back if is any question or any concern.  Follow-up in 3 months.   Follow Up  Instructions:     I discussed the assessment and treatment plan with the patient. The patient was provided an opportunity to ask questions and all were answered. The patient agreed with the plan and demonstrated an understanding of the instructions.   The patient was advised to call back or seek an in-person evaluation if the symptoms worsen or if the condition fails to improve as anticipated.    Collaboration of Care: Other provider involved in patient's care AEB notes are available in epic to review.  Patient/Guardian was advised Release of Information must be obtained prior to any record release in order to collaborate their care with an outside provider. Patient/Guardian was advised if they have not already done so to contact the registration department to sign all necessary forms in order for [place] to release information regarding their care.   Consent: Patient/Guardian gives verbal consent for  treatment and assignment of benefits for services provided during this visit. Patient/Guardian expressed understanding and agreed to proceed.     I provided 20 minutes of non face to face time during this encounter.  Note: This document was prepared by [employer] voice dictation technology and any errors that results from this process are unintentional.    Cleotis Nipper, MD 05/16/2023

## 2023-05-27 ENCOUNTER — Other Ambulatory Visit (HOSPITAL_COMMUNITY): Payer: Self-pay | Admitting: Psychiatry

## 2023-05-27 DIAGNOSIS — F250 Schizoaffective disorder, bipolar type: Secondary | ICD-10-CM

## 2023-05-29 ENCOUNTER — Other Ambulatory Visit: Payer: Self-pay | Admitting: Family Medicine

## 2023-05-29 MED ORDER — LACOSAMIDE 150 MG PO TABS: 1.0000 | ORAL_TABLET | Freq: Two times a day (BID) | ORAL | 1 refills | Status: DC

## 2023-05-29 NOTE — Telephone Encounter (Signed)
Last office visit: 07/03/2022 Next office visit: 07/04/2023

## 2023-05-29 NOTE — Telephone Encounter (Signed)
Pt is needing a refill request for his  Lacosamide (VIMPAT) 150 MG TABS [facility] [address]

## 2023-05-30 ENCOUNTER — Other Ambulatory Visit: Payer: Self-pay | Admitting: Family Medicine

## 2023-05-30 DIAGNOSIS — E119 Type 2 diabetes mellitus without complications: Secondary | ICD-10-CM

## 2023-06-18 ENCOUNTER — Ambulatory Visit: Payer: 59

## 2023-06-19 ENCOUNTER — Other Ambulatory Visit (HOSPITAL_COMMUNITY): Payer: Self-pay | Admitting: Psychiatry

## 2023-06-19 DIAGNOSIS — F250 Schizoaffective disorder, bipolar type: Secondary | ICD-10-CM

## 2023-07-02 ENCOUNTER — Other Ambulatory Visit (HOSPITAL_COMMUNITY): Payer: Self-pay | Admitting: *Deleted

## 2023-07-02 ENCOUNTER — Other Ambulatory Visit (HOSPITAL_COMMUNITY): Payer: Self-pay | Admitting: Psychiatry

## 2023-07-02 DIAGNOSIS — F250 Schizoaffective disorder, bipolar type: Secondary | ICD-10-CM

## 2023-07-03 NOTE — Progress Notes (Deleted)
No chief complaint on file.   HISTORY OF PRESENT ILLNESS:  07/03/23 ALL:  [name] returns for follow up for seizures. He continues lacosamide 150mg  BID.  Labs performed regularly with PCP. He continues to see psychiatry. Mood is good.   07/03/2022 ALL: [name] returns for follow up for seizures. He continues lacosamide 150mg  BID. He is doing well. Tolerating meds. No seizure activity. He reports mood is stable. He is sleeping well. He continues close follow up with PCP and psychiatry.   04/13/2021 ALL:  [name] is a 67 y.o. male here today for follow up for seizures. He continues Vimpat and tolerating well. No recent seizure activity. He continues close follow up with psychiatry and PCP. He reports sleeping well. Memory stable. No new or worsening concerns, today.   HISTORY (copied from Dr Anne Hahn' previous note)  [name] is a 67 year old left-handed white male with a history of schizoaffective disorder, and some memory issues.  He does have history of seizures but he has done quite well on Vimpat.  He has not had any further seizures.  He has a chronic issue with insomnia.  He has had neuropsychological testing that did not show an organic dementia, he was felt that he had executive function disorder that was in part related to depression, anxiety, and sleep problems.  The patient is also on multiple medications that are psychoactive including Vimpat, amitriptyline, Seroquel, and oxybutynin.  The patient claims that it takes about 2 hours for him to get to sleep even though he takes 200 mg of amitriptyline, 5 mg of Ambien, and 400 mg of Seroquel at night.  The patient returns for an evaluation.  He is currently not operating a motor vehicle but is planning on trying to get his driver's license back.  REVIEW OF SYSTEMS: Out of a complete 14 system review of symptoms, the patient complains only of the following symptoms,memory loss, insomnia and all other reviewed systems are  negative.  ALLERGIES: Allergies  Allergen Reactions   Codeine Itching and Nausea And Vomiting   Sulfa Antibiotics Nausea And Vomiting   Serotonin Reuptake Inhibitors (Ssris)     Restless   Sulfasalazine Nausea And Vomiting    HOME MEDICATIONS: Outpatient Medications Prior to Visit  Medication Sig Dispense Refill   amitriptyline (ELAVIL) 100 MG tablet Take 2 tablets (200 mg total) by mouth at bedtime. 60 tablet 2   aspirin EC 81 MG tablet Take 81 mg by mouth at bedtime. Swallow whole.     atorvastatin (LIPITOR) 40 MG tablet TAKE 1 TABLET BY MOUTH EVERY DAY 90 tablet 3   bisacodyl (DULCOLAX) 10 MG suppository Place 1 suppository (10 mg total) rectally daily. 12 suppository 0   docusate sodium (COLACE) 100 MG capsule Take 1 capsule (100 mg total) by mouth 2 (two) times daily. 10 capsule 0   hydrocortisone 2.5 % ointment Apply topically 2 times daily during eczema flare. (Patient not taking: Reported on 01/04/2023) 30 g 0   Lacosamide (VIMPAT) 150 MG TABS Take 1 tablet (150 mg total) by mouth 2 (two) times daily. 180 tablet 1   LINZESS 145 MCG CAPS capsule TAKE 1 CAPSULE BY MOUTH EVERY DAY BEFORE BREAKFAST 30 capsule 3   metFORMIN (GLUCOPHAGE-XR) 500 MG 24 hr tablet TAKE 2 TABLETS BY MOUTH EVERY DAY 60 tablet 0   metoprolol succinate (TOPROL-XL) 100 MG 24 hr tablet Take 1 tablet (100 mg total) by mouth daily. Take with or immediately following a meal. 180 tablet 0  Multiple Vitamin (MULTIVITAMIN WITH MINERALS) TABS tablet Take 1 tablet by mouth daily.     polyethylene glycol powder ([facility] PURELAX) 17 GM/SCOOP powder TAKE 17 GRAMS 2 TIMES A DAY AS NEEDED FOR CONSTIPATION. MIX INTO 8 OF FLUID AND DRINK 510 g 1   QUEtiapine (SEROQUEL) 300 MG tablet Take 1 tablet (300 mg total) by mouth at bedtime. 30 tablet 2   senna (SENOKOT) 8.6 MG TABS tablet Take 1 tablet (8.6 mg total) by mouth 2 (two) times daily. 120 tablet 0   Suvorexant (BELSOMRA) 10 MG TABS Take 1 tablet (10 mg total) by mouth at  bedtime. 30 tablet 2   testosterone cypionate (DEPOTESTOSTERONE CYPIONATE) 200 MG/ML injection Inject 200 mg into the skin every 14 (fourteen) days.     No facility-administered medications prior to visit.     PAST MEDICAL HISTORY: Past Medical History:  Diagnosis Date   Anxiety    Bowel obstruction (HCC)    Chronic insomnia 04/14/2020   Colon polyps    Constipation    Depression    Diabetes mellitus without complication (HCC)    Dyslipidemia    GERD (gastroesophageal reflux disease)    Hearing loss    History of echocardiogram    Echo 8/18: EF 50-55, normal wall motion, grade 1 diastolic dysfunction, mild MR   Hypertension    Memory difficulty 10/25/2017   Memory loss    MRSA carrier    Pneumonia    Schizoaffective disorder    Seizures (HCC)    last 4-5 years ago per pt   Skin cancer      PAST SURGICAL HISTORY: Past Surgical History:  Procedure Laterality Date   CARPAL TUNNEL RELEASE     COLONOSCOPY  12/17/2008   COLONOSCOPY  07/23/2018   Dr.Jacobs   COLONOSCOPY WITH PROPOFOL N/A 07/05/2022   Procedure: COLONOSCOPY WITH PROPOFOL;  Surgeon: Rachael Fee, MD;  Location: [facility];  Service: Gastroenterology;  Laterality: N/A;  Incomplete Colon   COLONOSCOPY WITH PROPOFOL N/A 08/09/2022   Procedure: COLONOSCOPY WITH PROPOFOL;  Surgeon: Meridee Score Netty Starring., MD;  Location: [facility];  Service: Gastroenterology;  Laterality: N/A;   ORIF ANKLE FRACTURE Left 11/26/2018   Procedure: OPEN REDUCTION INTERNAL FIXATION (ORIF) ANKLE FRACTURE;  Surgeon: Bjorn Pippin, MD;  Location: [facility] OR;  Service: Orthopedics;  Laterality: Left;   ORIF SHOULDER FRACTURE Right    Arthroplasty   POLYPECTOMY  08/09/2022   Procedure: POLYPECTOMY;  Surgeon: Mansouraty, Netty Starring., MD;  Location: [facility];  Service: Gastroenterology;;   self orchectomy Bilateral    SHOULDER CLOSED REDUCTION Left 09/16/2013   Procedure: CLOSED REDUCTION SHOULDER;  Surgeon: Shelda Pal, MD;   Location: [facility];  Service: Orthopedics;  Laterality: Left;   SHOULDER HEMI-ARTHROPLASTY Left 09/18/2013   Procedure: LEFT SHOULDER HEMI-ARTHROPLASTY;  Surgeon: Verlee Rossetti, MD;  Location: [facility];  Service: Orthopedics;  Laterality: Left;   TIBIA IM NAIL INSERTION Left 11/26/2018   Procedure: INTRAMEDULLARY (IM) NAIL TIBIAL;  Surgeon: Bjorn Pippin, MD;  Location: [facility] OR;  Service: Orthopedics;  Laterality: Left;   UPPER GASTROINTESTINAL ENDOSCOPY  07/23/2018   Dr.Jacobs     FAMILY HISTORY: Family History  Problem Relation Age of Onset   Stroke Mother        Stroke in late 22's. Died in her 51's   Fibromyalgia Mother    Stroke Father        Living at 75   Alcohol abuse Brother    Alcohol abuse Brother  Diabetes Brother    Colon cancer Neg Hx    Esophageal cancer Neg Hx    Stomach cancer Neg Hx    Rectal cancer Neg Hx      SOCIAL HISTORY: Social History   Socioeconomic History   Marital status: Single    Spouse name: Not on file   Number of children: 0   Years of education: 18   Highest education level: Not on file  Occupational History   Occupation: disabled  Tobacco Use   Smoking status: Never    Passive exposure: Never   Smokeless tobacco: Never  Vaping Use   Vaping status: Never Used  Substance and Sexual Activity   Alcohol use: No    Alcohol/week: 0.0 standard drinks of alcohol   Drug use: No   Sexual activity: Not Currently    Partners: Female    Birth control/protection: None  Other Topics Concern   Not on file  Social History Narrative   Patient does not drink caffeine.   Patient is left handed.   Social Determinants of Health   Financial Resource Strain: Low Risk  (08/04/2018)   Overall Financial Resource Strain (CARDIA)    Difficulty of Paying Living Expenses: Not hard at all  Food Insecurity: No Food Insecurity (08/04/2018)   Hunger Vital Sign    Worried About Running Out of Food in the Last Year: Never true    Ran Out of Food in the Last  Year: Never true  Transportation Needs: No Transportation Needs (08/04/2018)   PRAPARE - Administrator, Civil Service (Medical): No    Lack of Transportation (Non-Medical): No  Physical Activity: Inactive (08/04/2018)   Exercise Vital Sign    Days of Exercise per Week: 0 days    Minutes of Exercise per Session: 0 min  Stress: No Stress Concern Present (08/04/2018)   [employer] of Occupational Health - Occupational Stress Questionnaire    Feeling of Stress : Only a little  Social Connections: Unknown (08/04/2018)   Social Connection and Isolation Panel [NHANES]    Frequency of Communication with Friends and Family: More than three times a week    Frequency of Social Gatherings with Friends and Family: More than three times a week    Attends Religious Services: Never    Database administrator or Organizations: No    Attends Banker Meetings: Never    Marital Status: Not on file  Intimate Partner Violence: Not on file      PHYSICAL EXAM  There were no vitals filed for this visit.   There is no height or weight on file to calculate BMI.   Generalized: Well developed, in no acute distress  Cardiology: normal rate and rhythm, no murmur auscultated  Respiratory: clear to auscultation bilaterally    Neurological examination  Mentation: Alert oriented to time, place, history taking. Follows all commands speech and language fluent Cranial nerve II-XII: Pupils were equal round reactive to light. Extraocular movements were full, visual field were full on confrontational test. Facial sensation and strength were normal. Head turning and shoulder shrug  were normal and symmetric. Motor: The motor testing reveals 5 over 5 strength of all 4 extremities. Good symmetric motor tone is noted throughout.   Gait and station: Gait is normal.     DIAGNOSTIC DATA (LABS, IMAGING, TESTING) - I reviewed patient records, labs, notes, testing and imaging myself where  available.  Lab Results  Component Value Date   WBC 8.8 02/08/2022  HGB 13.4 02/08/2022   HCT 42.7 02/08/2022   MCV 81 02/08/2022   PLT 226 02/08/2022      Component Value Date/Time   NA 132 (L) 10/26/2021 2127   NA 139 05/25/2021 1440   K 4.1 10/26/2021 2127   CL 99 10/26/2021 2127   CO2 23 10/26/2021 2127   GLUCOSE 110 (H) 10/26/2021 2127   BUN 19 10/26/2021 2127   BUN 12 05/25/2021 1440   CREATININE 0.96 10/26/2021 2127   CREATININE 0.87 02/18/2017 1413   CALCIUM 8.7 (L) 10/26/2021 2127   PROT 7.0 10/26/2021 2127   PROT 6.8 05/25/2021 1440   ALBUMIN 4.3 10/26/2021 2127   ALBUMIN 4.4 05/25/2021 1440   AST 17 10/26/2021 2127   ALT 15 10/26/2021 2127   ALKPHOS 98 10/26/2021 2127   BILITOT 1.0 10/26/2021 2127   BILITOT 0.3 05/25/2021 1440   GFRNONAA >60 10/26/2021 2127   GFRNONAA >89 02/18/2017 1413   GFRAA 93 03/17/2020 1409   GFRAA >89 02/18/2017 1413   Lab Results  Component Value Date   CHOL 123 08/28/2022   HDL 49 08/28/2022   LDLCALC 47 08/28/2022   TRIG 161 (H) 08/28/2022   CHOLHDL 2.5 08/28/2022   Lab Results  Component Value Date   HGBA1C 6.0 11/19/2022   Lab Results  Component Value Date   VITAMINB12 422 10/25/2017   Lab Results  Component Value Date   TSH 1.240 05/25/2021       10/07/2019   12:50 PM 05/15/2018    8:18 AM 10/25/2017   10:53 AM  MMSE - Mini Mental State Exam  Orientation to time 4 5 4   Orientation to Place 5 4 5   Registration 3 3 3   Attention/ Calculation 3 3 2   Recall 3 3 2   Language- name 2 objects 2 2 2   Language- repeat 1 1 1   Language- follow 3 step command 3 3 3   Language- read & follow direction 1 1 1   Write a sentence 1 1 1   Copy design 1 1 1   Copy design-comments named 10 animals    Total score 27 27 25         02/08/2020    9:00 AM  Montreal Cognitive Assessment   Visuospatial/ Executive (0/5) 2  Naming (0/3) 3  Attention: Read list of digits (0/2) 2  Attention: Read list of letters (0/1) 1   Attention: Serial 7 subtraction starting at 100 (0/3) 2  Language: Repeat phrase (0/2) 2  Language : Fluency (0/1) 1  Abstraction (0/2) 1  Delayed Recall (0/5) 3  Orientation (0/6) 5  Total 22  Adjusted Score (based on education) 22     ASSESSMENT AND PLAN  67 y.o. year old male  has a past medical history of Anxiety, Bowel obstruction (HCC), Chronic insomnia (04/14/2020), Colon polyps, Constipation, Depression, Diabetes mellitus without complication (HCC), Dyslipidemia, GERD (gastroesophageal reflux disease), Hearing loss, History of echocardiogram, Hypertension, Memory difficulty (10/25/2017), Memory loss, MRSA carrier, Pneumonia, Schizoaffective disorder, Seizures (HCC), and Skin cancer. here with     No diagnosis found.  [name] reports doing well, today. No seizures. He is tolerating lacosamide. We will continue 150mg  twice daily. He is sleeping well and feels mood is stable. He will continue close follow up with PCP and psychiatry. Healthy lifestyle habits encouraged. He will follow up in 1 year, sooner if needed.   No orders of the defined types were placed in this encounter.    No orders of the defined types were placed in  this encounter.    Shawnie Dapper, MSN, FNP-C 07/03/2023, 4:25 PM  [facility] [address] [place], [place] [address] [phone]

## 2023-07-04 ENCOUNTER — Ambulatory Visit: Payer: 59 | Admitting: Family Medicine

## 2023-07-04 ENCOUNTER — Encounter: Payer: Self-pay | Admitting: Family Medicine

## 2023-07-04 DIAGNOSIS — G40909 Epilepsy, unspecified, not intractable, without status epilepticus: Secondary | ICD-10-CM

## 2023-07-25 ENCOUNTER — Other Ambulatory Visit: Payer: Self-pay | Admitting: Family Medicine

## 2023-07-25 DIAGNOSIS — E119 Type 2 diabetes mellitus without complications: Secondary | ICD-10-CM

## 2023-07-26 NOTE — Telephone Encounter (Signed)
Please let patient know I am refilling this medication, but he needs to schedule an appointment with me.   Thanks, Brittany J McIntyre, MD  

## 2023-07-29 NOTE — Patient Instructions (Incomplete)
Below is our plan:  We will continue lacosamide 150mg  twice daily. Continue labs with PCP at annual physicals. Ask her about need to take aspirin. Ask psychiatry about seeing a counselor for insomnia.   Please make sure you are consistent with timing of seizure medication. I recommend annual visit with primary care provider (PCP) for complete physical and routine blood work. I recommend daily intake of vitamin D (400-800iu) and calcium (800-1000mg ) for bone health. Discuss Dexa screening with PCP.   According to [place] law, you can not drive unless you are seizure / syncope free for at least 6 months and under physician's care.  Please maintain precautions. Do not participate in activities where a loss of awareness could harm you or someone else. No swimming alone, no tub bathing, no hot tubs, no driving, no operating motorized vehicles (cars, ATVs, motocycles, etc), lawnmowers, power tools or firearms. No standing at heights, such as rooftops, ladders or stairs. Avoid hot objects such as stoves, heaters, open fires. Wear a helmet when riding a bicycle, scooter, skateboard, etc. and avoid areas of traffic. Set your water heater to 120 degrees or less.  SUDEP is the sudden, unexpected death of someone with epilepsy, who was otherwise healthy. In SUDEP cases, no other cause of death is found when an autopsy is done. Each year, more than 1 in 1,000 people with epilepsy die from SUDEP. This is the leading cause of death in people with uncontrolled seizures. Until further answers are available, the best way to prevent SUDEP is to lower your risk by controlling seizures. Research has found that people with all types of epilepsy that experience convulsive seizures can be at risk.  Please make sure you are staying well hydrated. I recommend 50-60 ounces daily. Well balanced diet and regular exercise encouraged. Consistent sleep schedule with 6-8 hours recommended.   Please continue follow up with care team as  directed.   Follow up with me in 1 year   You may receive a survey regarding today's visit. I encourage you to leave honest feed back as I do use this information to improve patient care. Thank you for seeing me today!

## 2023-07-29 NOTE — Progress Notes (Unsigned)
No chief complaint on file.   HISTORY OF PRESENT ILLNESS:  07/29/23 ALL:  [name] returns for follow up for seizures. He continues lacosamide 150mg  BID.  Labs performed regularly with PCP. He continues to see psychiatry. Mood is good.   07/03/2022 ALL: [name] returns for follow up for seizures. He continues lacosamide 150mg  BID. He is doing well. Tolerating meds. No seizure activity. He reports mood is stable. He is sleeping well. He continues close follow up with PCP and psychiatry.   04/13/2021 ALL:  [name] is a 67 y.o. male here today for follow up for seizures. He continues Vimpat and tolerating well. No recent seizure activity. He continues close follow up with psychiatry and PCP. He reports sleeping well. Memory stable. No new or worsening concerns, today.   HISTORY (copied from Dr Anne Hahn' previous note)  [name] is a 67 year old left-handed white male with a history of schizoaffective disorder, and some memory issues.  He does have history of seizures but he has done quite well on Vimpat.  He has not had any further seizures.  He has a chronic issue with insomnia.  He has had neuropsychological testing that did not show an organic dementia, he was felt that he had executive function disorder that was in part related to depression, anxiety, and sleep problems.  The patient is also on multiple medications that are psychoactive including Vimpat, amitriptyline, Seroquel, and oxybutynin.  The patient claims that it takes about 2 hours for him to get to sleep even though he takes 200 mg of amitriptyline, 5 mg of Ambien, and 400 mg of Seroquel at night.  The patient returns for an evaluation.  He is currently not operating a motor vehicle but is planning on trying to get his driver's license back.  REVIEW OF SYSTEMS: Out of a complete 14 system review of symptoms, the patient complains only of the following symptoms,memory loss, insomnia and all other reviewed systems are  negative.  ALLERGIES: Allergies  Allergen Reactions   Codeine Itching and Nausea And Vomiting   Sulfa Antibiotics Nausea And Vomiting   Serotonin Reuptake Inhibitors (Ssris)     Restless   Sulfasalazine Nausea And Vomiting    HOME MEDICATIONS: Outpatient Medications Prior to Visit  Medication Sig Dispense Refill   amitriptyline (ELAVIL) 100 MG tablet Take 2 tablets (200 mg total) by mouth at bedtime. 60 tablet 2   aspirin EC 81 MG tablet Take 81 mg by mouth at bedtime. Swallow whole.     atorvastatin (LIPITOR) 40 MG tablet TAKE 1 TABLET BY MOUTH EVERY DAY 90 tablet 3   bisacodyl (DULCOLAX) 10 MG suppository Place 1 suppository (10 mg total) rectally daily. 12 suppository 0   docusate sodium (COLACE) 100 MG capsule Take 1 capsule (100 mg total) by mouth 2 (two) times daily. 10 capsule 0   hydrocortisone 2.5 % ointment Apply topically 2 times daily during eczema flare. (Patient not taking: Reported on 01/04/2023) 30 g 0   Lacosamide (VIMPAT) 150 MG TABS Take 1 tablet (150 mg total) by mouth 2 (two) times daily. 180 tablet 1   LINZESS 145 MCG CAPS capsule TAKE 1 CAPSULE BY MOUTH EVERY DAY BEFORE BREAKFAST 30 capsule 3   metFORMIN (GLUCOPHAGE-XR) 500 MG 24 hr tablet Take 2 tablets (1,000 mg total) by mouth daily. 180 tablet 0   metoprolol succinate (TOPROL-XL) 100 MG 24 hr tablet Take 1 tablet (100 mg total) by mouth daily. Take with or immediately following a meal. 180  tablet 0   Multiple Vitamin (MULTIVITAMIN WITH MINERALS) TABS tablet Take 1 tablet by mouth daily.     polyethylene glycol powder ([employer] PURELAX) 17 GM/SCOOP powder TAKE 17 GRAMS 2 TIMES A DAY AS NEEDED FOR CONSTIPATION. MIX INTO 8 OF FLUID AND DRINK 510 g 1   QUEtiapine (SEROQUEL) 300 MG tablet Take 1 tablet (300 mg total) by mouth at bedtime. 30 tablet 2   senna (SENOKOT) 8.6 MG TABS tablet Take 1 tablet (8.6 mg total) by mouth 2 (two) times daily. 120 tablet 0   Suvorexant (BELSOMRA) 10 MG TABS Take 1 tablet (10 mg total) by  mouth at bedtime. 30 tablet 2   testosterone cypionate (DEPOTESTOSTERONE CYPIONATE) 200 MG/ML injection Inject 200 mg into the skin every 14 (fourteen) days.     No facility-administered medications prior to visit.     PAST MEDICAL HISTORY: Past Medical History:  Diagnosis Date   Anxiety    Bowel obstruction (HCC)    Chronic insomnia 04/14/2020   Colon polyps    Constipation    Depression    Diabetes mellitus without complication (HCC)    Dyslipidemia    GERD (gastroesophageal reflux disease)    Hearing loss    History of echocardiogram    Echo 8/18: EF 50-55, normal wall motion, grade 1 diastolic dysfunction, mild MR   Hypertension    Memory difficulty 10/25/2017   Memory loss    MRSA carrier    Pneumonia    Schizoaffective disorder    Seizures (HCC)    last 4-5 years ago per pt   Skin cancer      PAST SURGICAL HISTORY: Past Surgical History:  Procedure Laterality Date   CARPAL TUNNEL RELEASE     COLONOSCOPY  12/17/2008   COLONOSCOPY  07/23/2018   Dr.Jacobs   COLONOSCOPY WITH PROPOFOL N/A 07/05/2022   Procedure: COLONOSCOPY WITH PROPOFOL;  Surgeon: Rachael Fee, MD;  Location: [facility];  Service: Gastroenterology;  Laterality: N/A;  Incomplete Colon   COLONOSCOPY WITH PROPOFOL N/A 08/09/2022   Procedure: COLONOSCOPY WITH PROPOFOL;  Surgeon: Meridee Score Netty Starring., MD;  Location: [facility];  Service: Gastroenterology;  Laterality: N/A;   ORIF ANKLE FRACTURE Left 11/26/2018   Procedure: OPEN REDUCTION INTERNAL FIXATION (ORIF) ANKLE FRACTURE;  Surgeon: Bjorn Pippin, MD;  Location: [facility] OR;  Service: Orthopedics;  Laterality: Left;   ORIF SHOULDER FRACTURE Right    Arthroplasty   POLYPECTOMY  08/09/2022   Procedure: POLYPECTOMY;  Surgeon: Mansouraty, Netty Starring., MD;  Location: [facility];  Service: Gastroenterology;;   self orchectomy Bilateral    SHOULDER CLOSED REDUCTION Left 09/16/2013   Procedure: CLOSED REDUCTION SHOULDER;  Surgeon: Shelda Pal,  MD;  Location: [facility];  Service: Orthopedics;  Laterality: Left;   SHOULDER HEMI-ARTHROPLASTY Left 09/18/2013   Procedure: LEFT SHOULDER HEMI-ARTHROPLASTY;  Surgeon: Verlee Rossetti, MD;  Location: [facility] OR;  Service: Orthopedics;  Laterality: Left;   TIBIA IM NAIL INSERTION Left 11/26/2018   Procedure: INTRAMEDULLARY (IM) NAIL TIBIAL;  Surgeon: Bjorn Pippin, MD;  Location: [facility] OR;  Service: Orthopedics;  Laterality: Left;   UPPER GASTROINTESTINAL ENDOSCOPY  07/23/2018   Dr.Jacobs     FAMILY HISTORY: Family History  Problem Relation Age of Onset   Stroke Mother        Stroke in late 64's. Died in her 22's   Fibromyalgia Mother    Stroke Father        Living at 41   Alcohol abuse Brother  Alcohol abuse Brother    Diabetes Brother    Colon cancer Neg Hx    Esophageal cancer Neg Hx    Stomach cancer Neg Hx    Rectal cancer Neg Hx      SOCIAL HISTORY: Social History   Socioeconomic History   Marital status: Single    Spouse name: Not on file   Number of children: 0   Years of education: 18   Highest education level: Not on file  Occupational History   Occupation: disabled  Tobacco Use   Smoking status: Never    Passive exposure: Never   Smokeless tobacco: Never  Vaping Use   Vaping status: Never Used  Substance and Sexual Activity   Alcohol use: No    Alcohol/week: 0.0 standard drinks of alcohol   Drug use: No   Sexual activity: Not Currently    Partners: Female    Birth control/protection: None  Other Topics Concern   Not on file  Social History Narrative   Patient does not drink caffeine.   Patient is left handed.   Social Determinants of Health   Financial Resource Strain: Low Risk  (08/04/2018)   Overall Financial Resource Strain (CARDIA)    Difficulty of Paying Living Expenses: Not hard at all  Food Insecurity: No Food Insecurity (08/04/2018)   Hunger Vital Sign    Worried About Running Out of Food in the Last Year: Never true    Ran Out of Food in the  Last Year: Never true  Transportation Needs: No Transportation Needs (08/04/2018)   PRAPARE - Administrator, Civil Service (Medical): No    Lack of Transportation (Non-Medical): No  Physical Activity: Inactive (08/04/2018)   Exercise Vital Sign    Days of Exercise per Week: 0 days    Minutes of Exercise per Session: 0 min  Stress: No Stress Concern Present (08/04/2018)   [employer] of Occupational Health - Occupational Stress Questionnaire    Feeling of Stress : Only a little  Social Connections: Unknown (08/04/2018)   Social Connection and Isolation Panel [NHANES]    Frequency of Communication with Friends and Family: More than three times a week    Frequency of Social Gatherings with Friends and Family: More than three times a week    Attends Religious Services: Never    Database administrator or Organizations: No    Attends Banker Meetings: Never    Marital Status: Not on file  Intimate Partner Violence: Not on file      PHYSICAL EXAM  There were no vitals filed for this visit.   There is no height or weight on file to calculate BMI.   Generalized: Well developed, in no acute distress  Cardiology: normal rate and rhythm, no murmur auscultated  Respiratory: clear to auscultation bilaterally    Neurological examination  Mentation: Alert oriented to time, place, history taking. Follows all commands speech and language fluent Cranial nerve II-XII: Pupils were equal round reactive to light. Extraocular movements were full, visual field were full on confrontational test. Facial sensation and strength were normal. Head turning and shoulder shrug  were normal and symmetric. Motor: The motor testing reveals 5 over 5 strength of all 4 extremities. Good symmetric motor tone is noted throughout.   Gait and station: Gait is normal.     DIAGNOSTIC DATA (LABS, IMAGING, TESTING) - I reviewed patient records, labs, notes, testing and imaging myself where  available.  Lab Results  Component Value  Date   WBC 8.8 02/08/2022   HGB 13.4 02/08/2022   HCT 42.7 02/08/2022   MCV 81 02/08/2022   PLT 226 02/08/2022      Component Value Date/Time   NA 132 (L) 10/26/2021 2127   NA 139 05/25/2021 1440   K 4.1 10/26/2021 2127   CL 99 10/26/2021 2127   CO2 23 10/26/2021 2127   GLUCOSE 110 (H) 10/26/2021 2127   BUN 19 10/26/2021 2127   BUN 12 05/25/2021 1440   CREATININE 0.96 10/26/2021 2127   CREATININE 0.87 02/18/2017 1413   CALCIUM 8.7 (L) 10/26/2021 2127   PROT 7.0 10/26/2021 2127   PROT 6.8 05/25/2021 1440   ALBUMIN 4.3 10/26/2021 2127   ALBUMIN 4.4 05/25/2021 1440   AST 17 10/26/2021 2127   ALT 15 10/26/2021 2127   ALKPHOS 98 10/26/2021 2127   BILITOT 1.0 10/26/2021 2127   BILITOT 0.3 05/25/2021 1440   GFRNONAA >60 10/26/2021 2127   GFRNONAA >89 02/18/2017 1413   GFRAA 93 03/17/2020 1409   GFRAA >89 02/18/2017 1413   Lab Results  Component Value Date   CHOL 123 08/28/2022   HDL 49 08/28/2022   LDLCALC 47 08/28/2022   TRIG 161 (H) 08/28/2022   CHOLHDL 2.5 08/28/2022   Lab Results  Component Value Date   HGBA1C 6.0 11/19/2022   Lab Results  Component Value Date   VITAMINB12 422 10/25/2017   Lab Results  Component Value Date   TSH 1.240 05/25/2021       10/07/2019   12:50 PM 05/15/2018    8:18 AM 10/25/2017   10:53 AM  MMSE - Mini Mental State Exam  Orientation to time 4 5 4   Orientation to Place 5 4 5   Registration 3 3 3   Attention/ Calculation 3 3 2   Recall 3 3 2   Language- name 2 objects 2 2 2   Language- repeat 1 1 1   Language- follow 3 step command 3 3 3   Language- read & follow direction 1 1 1   Write a sentence 1 1 1   Copy design 1 1 1   Copy design-comments named 10 animals    Total score 27 27 25         02/08/2020    9:00 AM  Montreal Cognitive Assessment   Visuospatial/ Executive (0/5) 2  Naming (0/3) 3  Attention: Read list of digits (0/2) 2  Attention: Read list of letters (0/1) 1   Attention: Serial 7 subtraction starting at 100 (0/3) 2  Language: Repeat phrase (0/2) 2  Language : Fluency (0/1) 1  Abstraction (0/2) 1  Delayed Recall (0/5) 3  Orientation (0/6) 5  Total 22  Adjusted Score (based on education) 22     ASSESSMENT AND PLAN  67 y.o. year old male  has a past medical history of Anxiety, Bowel obstruction (HCC), Chronic insomnia (04/14/2020), Colon polyps, Constipation, Depression, Diabetes mellitus without complication (HCC), Dyslipidemia, GERD (gastroesophageal reflux disease), Hearing loss, History of echocardiogram, Hypertension, Memory difficulty (10/25/2017), Memory loss, MRSA carrier, Pneumonia, Schizoaffective disorder, Seizures (HCC), and Skin cancer. here with     No diagnosis found.  Samay reports doing well, today. No seizures. He is tolerating lacosamide. We will continue 150mg  twice daily. He is sleeping well and feels mood is stable. He will continue close follow up with PCP and psychiatry. Healthy lifestyle habits encouraged. He will follow up in 1 year, sooner if needed.   No orders of the defined types were placed in this encounter.    No  orders of the defined types were placed in this encounter.    Shawnie Dapper, MSN, FNP-C 07/29/2023, 4:26 PM  [facility] [address], [address] [place], [place] [address] [phone]

## 2023-07-30 ENCOUNTER — Ambulatory Visit (INDEPENDENT_AMBULATORY_CARE_PROVIDER_SITE_OTHER): Payer: Self-pay | Admitting: Family Medicine

## 2023-07-30 ENCOUNTER — Encounter: Payer: Self-pay | Admitting: Family Medicine

## 2023-07-30 VITALS — BP 121/80 | HR 67 | Ht 69.0 in | Wt 201.0 lb

## 2023-07-30 DIAGNOSIS — G40909 Epilepsy, unspecified, not intractable, without status epilepticus: Secondary | ICD-10-CM

## 2023-07-30 MED ORDER — LACOSAMIDE 150 MG PO TABS: 1.0000 | ORAL_TABLET | Freq: Two times a day (BID) | ORAL | 1 refills | Status: DC

## 2023-08-20 ENCOUNTER — Encounter (HOSPITAL_COMMUNITY): Payer: Self-pay | Admitting: Psychiatry

## 2023-08-20 ENCOUNTER — Telehealth (HOSPITAL_BASED_OUTPATIENT_CLINIC_OR_DEPARTMENT_OTHER): Payer: Self-pay | Admitting: Psychiatry

## 2023-08-20 VITALS — Wt 201.0 lb

## 2023-08-20 DIAGNOSIS — F411 Generalized anxiety disorder: Secondary | ICD-10-CM

## 2023-08-20 DIAGNOSIS — F5101 Primary insomnia: Secondary | ICD-10-CM

## 2023-08-20 DIAGNOSIS — F250 Schizoaffective disorder, bipolar type: Secondary | ICD-10-CM

## 2023-08-20 MED ORDER — QUETIAPINE FUMARATE 300 MG PO TABS: 300.0000 mg | ORAL_TABLET | Freq: Every day | ORAL | 2 refills | Status: DC

## 2023-08-20 MED ORDER — AMITRIPTYLINE HCL 100 MG PO TABS: 200.0000 mg | ORAL_TABLET | Freq: Every day | ORAL | 2 refills | Status: DC

## 2023-08-20 MED ORDER — BELSOMRA 10 MG PO TABS: 10.0000 mg | ORAL_TABLET | Freq: Every day | ORAL | 2 refills | Status: DC

## 2023-08-20 NOTE — Progress Notes (Signed)
[facility] MD Virtual Progress Note   Patient Location: Home Provider Location: Home Office  I connect with patient by telephone and verified that I am speaking with correct person by using two identifiers. I discussed the limitations of evaluation and management by telemedicine and the availability of in person appointments. I also discussed with the patient that there may be a patient responsible charge related to this service. The patient expressed understanding and agreed to proceed.  [name] [id number] 67 y.o.  08/20/2023 2:39 PM  History of Present Illness:  Patient is evaluated by phone session.  He again apologized not able to do video session but promised to have a neck session in the office.  Has been doing very well.  He has no major concern.  He is paranoia is stable.  He sleeps good.  He is getting along with his girlfriend and there has been no recent issues.  Recently had a visit with his neurology and his seizure medicines were continued.  He denies any crying spells or any feeling of hopelessness or worthlessness.  Denies any suicidal thoughts.  He does not go outside unless it is important.  He has no transportation.  He has no tremors or shakes or any EPS.  He is compliant with Seroquel, Belsomra and amitriptyline.  He does not want to change that dose and is not interested in therapy.  His appetite is okay.  His weight is stable.  Past Psychiatric History: H/O depression, paranoia, suicidal attempt, delusions and multiple inpatient.  H/O cutting testicles and thoughts of inhaling helium to end life. Last inpatient August 2016.  D/C on Seroquel 900 mg and amitriptyline 400 mg HS. Saw at [facility] and [name]. SSRI caused reactions. Tried nortriptyline, Haldol, Geodon, Seroquel, lithium, Prozac, Wellbutrin, Paxil, Klonopin, Ativan, Lexapro, Zoloft, abilify and Lamictal.  No H/O mania or drug use.  We did GeneSight testing and results are in the chart.  Only  antipsychotic medication that is unfavorable section is Saphris and Navane.  Navane made restless. Most of the SSRIs are also unfavorable section.    Outpatient Encounter Medications as of 08/20/2023  Medication Sig   amitriptyline (ELAVIL) 100 MG tablet Take 2 tablets (200 mg total) by mouth at bedtime.   aspirin EC 81 MG tablet Take 81 mg by mouth at bedtime. Swallow whole. (Patient not taking: Reported on 07/30/2023)   atorvastatin (LIPITOR) 40 MG tablet TAKE 1 TABLET BY MOUTH EVERY DAY   Lacosamide (VIMPAT) 150 MG TABS Take 1 tablet (150 mg total) by mouth 2 (two) times daily.   metFORMIN (GLUCOPHAGE-XR) 500 MG 24 hr tablet Take 2 tablets (1,000 mg total) by mouth daily.   metoprolol succinate (TOPROL-XL) 100 MG 24 hr tablet Take 1 tablet (100 mg total) by mouth daily. Take with or immediately following a meal.   Multiple Vitamin (MULTIVITAMIN WITH MINERALS) TABS tablet Take 1 tablet by mouth daily.   QUEtiapine (SEROQUEL) 300 MG tablet Take 1 tablet (300 mg total) by mouth at bedtime.   Suvorexant (BELSOMRA) 10 MG TABS Take 1 tablet (10 mg total) by mouth at bedtime.   testosterone cypionate (DEPOTESTOSTERONE CYPIONATE) 200 MG/ML injection Inject 200 mg into the skin every 14 (fourteen) days.   No facility-administered encounter medications on file as of 08/20/2023.    No results found for this or any previous visit (from the past 2160 hour(s)).   Psychiatric Specialty Exam: Physical Exam  Review of Systems  Weight 201 lb (91.2 kg).There is  no height or weight on file to calculate BMI.  General Appearance: NA  Eye Contact:  NA  Speech:  Slow  Volume:  Decreased  Mood:  Euthymic  Affect:  NA  Thought Process:  Goal Directed  Orientation:  Full (Time, Place, and Person)  Thought Content:  WDL  Suicidal Thoughts:  No  Homicidal Thoughts:  No  Memory:  Immediate;   Fair Recent;   Fair Remote;   Fair  Judgement:  Fair  Insight:  Shallow  Psychomotor Activity:  NA  Concentration:   Concentration: Fair and Attention Span: Fair  Recall:  Good  Fund of Knowledge:  Good  Language:  Good  Akathisia:  No  Handed:  Right  AIMS (if indicated):     Assets:  Communication Skills Desire for Improvement Housing  ADL's:  Intact  Cognition:  WNL  Sleep:  ok     Assessment/Plan: Schizoaffective disorder, bipolar type (HCC) - Plan: QUEtiapine (SEROQUEL) 300 MG tablet  GAD (generalized anxiety disorder) - Plan: amitriptyline (ELAVIL) 100 MG tablet  Primary insomnia - Plan: Suvorexant (BELSOMRA) 10 MG TABS  Patient is stable on his current medication.  I emphasized that he need in person visit in the future.  He agreed.  Continue amitriptyline 200 mg at bedtime, Belsomra 10 mg at bedtime and Seroquel 300 mg at bedtime.  He is not interested in therapy and he is also not interested to cutting down his medication.  I review records from neurology.  He is taking seizure medicine and is seizure free for at least 7 years.  Recommended to call us back with any question or any concern.  Follow-up in 3 months   Follow Up Instructions:     I discussed the assessment and treatment plan with the patient. The patient was provided an opportunity to ask questions and all were answered. The patient agreed with the plan and demonstrated an understanding of the instructions.   The patient was advised to call back or seek an in-person evaluation if the symptoms worsen or if the condition fails to improve as anticipated.    Collaboration of Care: Other provider involved in patient's care AEB notes are available in epic to review.  Patient/Guardian was advised Release of Information must be obtained prior to any record release in order to collaborate their care with an outside provider. Patient/Guardian was advised if they have not already done so to contact the registration department to sign all necessary forms in order for [place] to release information regarding their care.   Consent:  Patient/Guardian gives verbal consent for treatment and assignment of benefits for services provided during this visit. Patient/Guardian expressed understanding and agreed to proceed.     I provided 20 minutes of non face to face time during this encounter.  Note: This document was prepared by [employer] voice dictation technology and any errors that results from this process are unintentional.    Cleotis Nipper, MD 08/20/2023

## 2023-09-05 ENCOUNTER — Ambulatory Visit: Payer: 59 | Admitting: Family Medicine

## 2023-09-25 ENCOUNTER — Other Ambulatory Visit: Payer: Self-pay | Admitting: Family Medicine

## 2023-09-25 ENCOUNTER — Other Ambulatory Visit (HOSPITAL_COMMUNITY): Payer: Self-pay | Admitting: Psychiatry

## 2023-09-25 DIAGNOSIS — E785 Hyperlipidemia, unspecified: Secondary | ICD-10-CM

## 2023-09-25 DIAGNOSIS — F250 Schizoaffective disorder, bipolar type: Secondary | ICD-10-CM

## 2023-09-25 DIAGNOSIS — E119 Type 2 diabetes mellitus without complications: Secondary | ICD-10-CM

## 2023-09-27 NOTE — Telephone Encounter (Signed)
Please let patient know I am refilling this medication, but he needs to schedule and attend an appointment with me. He no showed to his last visit. He is past due for labwork to monitor his kidney function and diabetes. I will not refill after this until he is seen in the office.  Thanks, Latrelle Dodrill, MD

## 2023-10-03 ENCOUNTER — Other Ambulatory Visit: Payer: Self-pay

## 2023-10-03 DIAGNOSIS — I10 Essential (primary) hypertension: Secondary | ICD-10-CM

## 2023-10-03 MED ORDER — METOPROLOL SUCCINATE ER 100 MG PO TB24: 100.0000 mg | ORAL_TABLET | Freq: Every day | ORAL | 0 refills | Status: DC

## 2023-10-04 NOTE — Telephone Encounter (Signed)
Called and lvm for patient to call back and schedule follow up appointment.   Thanks [employer]

## 2023-10-15 ENCOUNTER — Other Ambulatory Visit: Payer: Self-pay | Admitting: Family Medicine

## 2023-10-15 DIAGNOSIS — E785 Hyperlipidemia, unspecified: Secondary | ICD-10-CM

## 2023-10-15 DIAGNOSIS — I10 Essential (primary) hypertension: Secondary | ICD-10-CM

## 2023-10-15 DIAGNOSIS — E119 Type 2 diabetes mellitus without complications: Secondary | ICD-10-CM

## 2023-11-03 ENCOUNTER — Other Ambulatory Visit: Payer: Self-pay | Admitting: Family Medicine

## 2023-11-03 DIAGNOSIS — E119 Type 2 diabetes mellitus without complications: Secondary | ICD-10-CM

## 2023-11-06 ENCOUNTER — Other Ambulatory Visit: Payer: Self-pay | Admitting: Family Medicine

## 2023-11-06 DIAGNOSIS — E785 Hyperlipidemia, unspecified: Secondary | ICD-10-CM

## 2023-11-06 DIAGNOSIS — I10 Essential (primary) hypertension: Secondary | ICD-10-CM

## 2023-11-18 ENCOUNTER — Ambulatory Visit (INDEPENDENT_AMBULATORY_CARE_PROVIDER_SITE_OTHER): Payer: Self-pay | Admitting: Family Medicine

## 2023-11-18 ENCOUNTER — Encounter: Payer: Self-pay | Admitting: Family Medicine

## 2023-11-18 VITALS — BP 120/74 | HR 73 | Ht 69.0 in | Wt 206.8 lb

## 2023-11-18 DIAGNOSIS — Z0000 Encounter for general adult medical examination without abnormal findings: Secondary | ICD-10-CM

## 2023-11-18 DIAGNOSIS — E785 Hyperlipidemia, unspecified: Secondary | ICD-10-CM

## 2023-11-18 DIAGNOSIS — Z23 Encounter for immunization: Secondary | ICD-10-CM

## 2023-11-18 DIAGNOSIS — E1169 Type 2 diabetes mellitus with other specified complication: Secondary | ICD-10-CM

## 2023-11-18 DIAGNOSIS — I10 Essential (primary) hypertension: Secondary | ICD-10-CM

## 2023-11-18 DIAGNOSIS — G40909 Epilepsy, unspecified, not intractable, without status epilepticus: Secondary | ICD-10-CM

## 2023-11-18 DIAGNOSIS — R413 Other amnesia: Secondary | ICD-10-CM

## 2023-11-18 LAB — POCT GLYCOSYLATED HEMOGLOBIN (HGB A1C): HbA1c, POC (controlled diabetic range): 6 % (ref 0.0–7.0)

## 2023-11-18 MED ORDER — METFORMIN HCL ER 500 MG PO TB24: 1000.0000 mg | ORAL_TABLET | Freq: Every day | ORAL | 3 refills | Status: AC

## 2023-11-18 MED ORDER — SHINGRIX 50 MCG/0.5ML IM SUSR: INTRAMUSCULAR | 1 refills | Status: DC

## 2023-11-18 MED ORDER — ATORVASTATIN CALCIUM 40 MG PO TABS: 40.0000 mg | ORAL_TABLET | Freq: Every day | ORAL | 3 refills | Status: AC

## 2023-11-18 MED ORDER — METOPROLOL SUCCINATE ER 100 MG PO TB24: 100.0000 mg | ORAL_TABLET | Freq: Every day | ORAL | 3 refills | Status: DC

## 2023-11-18 NOTE — Patient Instructions (Addendum)
It was great to see you again today.  Placed referral for social worker to reach out to you  Refilled your medications.   Your neurology office is: Shawnie Dapper, NP [facility] [phone] [name], [place] [address] [phone]  Your urology office is: Dr. Jerilee Field [facility] [address] [place], [place] [address] [phone]  Follow up with me in 3 months, sooner if needed.  Be well, Dr. Pollie Meyer

## 2023-11-18 NOTE — Assessment & Plan Note (Signed)
Pt reports memory changes. Was unable to recall prior specialist visits earlier this year. Does not want evaluation in geriatric clinic at this time. Continues to follow with Neuro for management of seizure disorder and medications; continues to follow with Psych for management of schizoaffective disorder and mood disorder and medications. - Continue to follow with Neuro, Psych - referral placed to [facility] for care management/social work to assist with finances/insurance as patient reports he does not qualify for medicare?

## 2023-11-18 NOTE — Assessment & Plan Note (Signed)
Pt reports compliance with Lipitor 40 mg daily.  - Continue current medications; refill provided - Will evaluate cholesterol with lipid panel

## 2023-11-18 NOTE — Assessment & Plan Note (Signed)
Pt reports compliance with metformin 1000 mg daily. HA1c under good control today at 6.0%. - Continue current medications; refill provided - Will evaluate kidney function with BMP

## 2023-11-18 NOTE — Assessment & Plan Note (Signed)
Pt's BP under good control today at 120/74. Reports compliance with Toprol XL 100 mg daily. - Continue current medications; refill provided

## 2023-11-18 NOTE — Progress Notes (Signed)
    SUBJECTIVE:   CHIEF COMPLAINT / HPI:   [name] is a 67 y.o. male who presents today for medication refills.  Pt is requesting medication refills of all his medications. His antiseizure medication is managed by neurology, though pt did not remember his neurologist's name or when he was last seen. Denies recent seizures. His testosterone is managed by urology, though pt did not remember his urologist's name or when he was last seen. We are managing his metformin, lipitor, and Toprol.  Pt is regularly being seen by psychiatry with upcoming appointment later in 11/2023. Pt reports he discontinued Seroquel on his own around 6 weeks ago; didn't like the side effects (dry mouth). Denies any change in mental status since he stopped this. Still seeing psychiatrist regularly; psychiatrist doesn't know that he stopped. Denies hallucinations (auditory and visual), denies increased desire to hurt self with no current thoughts of hurting self or others.  Reports poor memory. Had a test done 2-3 years ago. He reports his partner is moving out of the home, though they still plan to stay together and she will keep helping him get to appointments and with some other life things.  Last saw an eye doctor about a year ago. He wants flu shot today. He will consider Shingrix shot.  PERTINENT  PMH / PSH: Seizure disorder, HTN, T2DM, HLD, schizoaffective disorder, MDD  OBJECTIVE:   BP 120/74   Pulse 73   Ht 5\' 9"  (1.753 m)   Wt 206 lb 12.8 oz (93.8 kg)   SpO2 91%   BMI 30.54 kg/m   General: Pt is seated in chair, no acute distress. Cardiovascular: RRR, no murmurs, rubs, gallops. Pulmonary: Normal work of breathing. Lungs clear to auscultation bilaterally. Neuro/Psych: Alert and oriented to person, place, event, time; does not remember recent appointment with neurology this past summer or urology this past spring. Normal affect.  ASSESSMENT/PLAN:   Assessment & Plan Type 2 diabetes mellitus with  other specified complication, without long-term current use of insulin (HCC) Pt reports compliance with metformin 1000 mg daily. HA1c under good control today at 6.0%. - Continue current medications; refill provided - Will evaluate kidney function with BMP Hyperlipidemia, unspecified hyperlipidemia type Pt reports compliance with Lipitor 40 mg daily.  - Continue current medications; refill provided - Will evaluate cholesterol with lipid panel Essential hypertension Pt's BP under good control today at 120/74. Reports compliance with Toprol 100 mg daily. - Continue current medications; refill provided Memory disorder Pt reports memory changes. Was unable to recall prior specialist visits earlier this year. Does not want evaluation in geriatric clinic at this time. Continues to follow with Neuro for management of seizure disorder and medications; continues to follow with Psych for management of schizoaffective disorder and mood disorder and medications. - Continue to follow with Neuro, Psych Encounter for immunization Pt got flu shot today (11/18/23) in clinic. - Prescription placed for Shingrix vaccination  Testosterone managed by Urology; pt will continue to follow with Urology.  Governor Rooks, Medical Student [facility]

## 2023-11-19 ENCOUNTER — Encounter: Payer: Self-pay | Admitting: Family Medicine

## 2023-11-19 ENCOUNTER — Telehealth: Payer: Self-pay | Admitting: *Deleted

## 2023-11-19 LAB — LIPID PANEL
Chol/HDL Ratio: 2.5 {ratio} (ref 0.0–5.0)
Cholesterol, Total: 86 mg/dL — ABNORMAL LOW (ref 100–199)
HDL: 35 mg/dL — ABNORMAL LOW (ref >39)
LDL Chol Calc (NIH): 25 mg/dL (ref 0–99)
Triglycerides: 152 mg/dL — ABNORMAL HIGH (ref 0–149)
VLDL Cholesterol Cal: 26 mg/dL (ref 5–40)

## 2023-11-19 LAB — BASIC METABOLIC PANEL
BUN/Creatinine Ratio: 19 (ref 10–24)
BUN: 18 mg/dL (ref 8–27)
CO2: 20 mmol/L (ref 20–29)
Calcium: 9.6 mg/dL (ref 8.6–10.2)
Chloride: 100 mmol/L (ref 96–106)
Creatinine, Ser: 0.94 mg/dL (ref 0.76–1.27)
Glucose: 117 mg/dL — ABNORMAL HIGH (ref 70–99)
Potassium: 4.8 mmol/L (ref 3.5–5.2)
Sodium: 139 mmol/L (ref 134–144)
eGFR: 88 mL/min/{1.73_m2} (ref >59)

## 2023-11-19 NOTE — Progress Notes (Signed)
  Care Coordination  Outreach Note  11/19/2023 Name: [name] MRN: [id number] DOB: [birth year]   Care Coordination Outreach Attempts: An unsuccessful telephone outreach was attempted today to offer the patient information about available care coordination services.  Follow Up Plan:  Additional outreach attempts will be made to offer the patient care coordination information and services.   Encounter Outcome:  No Answer  Gwenevere Ghazi  Care Coordination Care Guide  Direct Dial: [phone]

## 2023-11-20 DIAGNOSIS — Z0000 Encounter for general adult medical examination without abnormal findings: Secondary | ICD-10-CM | POA: Insufficient documentation

## 2023-11-20 LAB — MICROALBUMIN / CREATININE URINE RATIO
Creatinine, Urine: 123.2 mg/dL
Microalb/Creat Ratio: 27 mg/g{creat} (ref 0–29)
Microalbumin, Urine: 32.7 ug/mL

## 2023-11-20 NOTE — Assessment & Plan Note (Signed)
Flu shot given today Rx given for shingrix UACR today Refer for eye exam

## 2023-11-20 NOTE — Assessment & Plan Note (Signed)
Advised follow up with neuro, provided neurology office contact information

## 2023-11-21 ENCOUNTER — Other Ambulatory Visit: Payer: Self-pay

## 2023-11-21 ENCOUNTER — Ambulatory Visit (HOSPITAL_BASED_OUTPATIENT_CLINIC_OR_DEPARTMENT_OTHER): Payer: Self-pay | Admitting: Psychiatry

## 2023-11-21 ENCOUNTER — Encounter (HOSPITAL_COMMUNITY): Payer: Self-pay | Admitting: Psychiatry

## 2023-11-21 VITALS — BP 149/89 | HR 78 | Ht 69.0 in | Wt 202.0 lb

## 2023-11-21 DIAGNOSIS — F411 Generalized anxiety disorder: Secondary | ICD-10-CM

## 2023-11-21 DIAGNOSIS — F5101 Primary insomnia: Secondary | ICD-10-CM

## 2023-11-21 DIAGNOSIS — F250 Schizoaffective disorder, bipolar type: Secondary | ICD-10-CM

## 2023-11-21 MED ORDER — QUETIAPINE FUMARATE 100 MG PO TABS: 100.0000 mg | ORAL_TABLET | Freq: Every day | ORAL | 2 refills | Status: DC

## 2023-11-21 MED ORDER — AMITRIPTYLINE HCL 100 MG PO TABS: 200.0000 mg | ORAL_TABLET | Freq: Every day | ORAL | 2 refills | Status: DC

## 2023-11-21 NOTE — Progress Notes (Addendum)
BH MD/PA/NP OP Progress Note  Patient location; office Provider location; office   11/21/2023 3:53 PM [name]  MRN:  [id number]  Chief Complaint:  Chief Complaint  Patient presents with   Follow-up   HPI: Patient came today for his follow-up appointment.  This is a in person visit.  He reported not taking Belsomra and Seroquel.  He lost his insurance and could not afford the Belsomra.  He also started to wean himself from Seroquel but did not provide the reason.  He denies any hallucination, paranoia or any suicidal thoughts but admitted not sleeping well.  Sometime he cried for no reason.  He admitted his memory is getting worse and he does not remember things very well.  He also not sure why he lost his insurance.  He lives with a lady who rent the room but now she is in a process of moving.  Patient will live by himself and he has some concern but he believes he should be okay.  He also mentioned if he has any issues then he will ask the lady to come back to stay with him.  Patient does not want to talk about his family member.  Apparently one of his brothers lives in [place] but he has no contact in more than 12 years.  He denies any anger or any suicidal thoughts.  When I ask about his memory issues which is a chronic and lately getting worse, patient told that he had neuropsych testing in [facility] but do not remember the details.  He is taking amitriptyline which he believes helped with anxiety and depression.  He has no tremor or shakes or any EPS.  Recently had a blood work.  Visit Diagnosis:    ICD-10-CM   1. Schizoaffective disorder, bipolar type (HCC)  F25.0 QUEtiapine (SEROQUEL) 100 MG tablet    2. GAD (generalized anxiety disorder)  F41.1 amitriptyline (ELAVIL) 100 MG tablet    3. Primary insomnia  F51.01       Past Psychiatric History: Reviewed H/O depression, paranoia, suicidal attempt, delusions and multiple inpatient.  H/O cutting testicles and thoughts of inhaling  helium to end life. Last inpatient August 2016.  D/C on Seroquel 900 mg and amitriptyline 400 mg HS. Saw at [name]. SSRI caused reactions. Tried nortriptyline, Haldol, Geodon, Seroquel, lithium, Prozac, Wellbutrin, Paxil, Klonopin, Ativan, Lexapro, Zoloft, abilify and Lamictal.  No H/O mania or drug use.  We did GeneSight testing and results are in the chart.  Only antipsychotic medication that is unfavorable section is Saphris and Navane.  Navane made restless. Most of the SSRIs are also unfavorable section.   Past Medical History:  Past Medical History:  Diagnosis Date   Anxiety    Bowel obstruction (HCC)    Chronic insomnia 04/14/2020   Colon polyps    Constipation    Depression    Diabetes mellitus without complication (HCC)    Dyslipidemia    GERD (gastroesophageal reflux disease)    Hearing loss    History of echocardiogram    Echo 8/18: EF 50-55, normal wall motion, grade 1 diastolic dysfunction, mild MR   Hypertension    Memory difficulty 10/25/2017   Memory loss    MRSA carrier    Pneumonia    Schizoaffective disorder    Seizures (HCC)    last 4-5 years ago per pt   Skin cancer     Past Surgical History:  Procedure Laterality Date   CARPAL TUNNEL RELEASE  COLONOSCOPY  12/17/2008   COLONOSCOPY  07/23/2018   Dr.Jacobs   COLONOSCOPY WITH PROPOFOL N/A 07/05/2022   Procedure: COLONOSCOPY WITH PROPOFOL;  Surgeon: Rachael Fee, MD;  Location: [facility];  Service: Gastroenterology;  Laterality: N/A;  Incomplete Colon   COLONOSCOPY WITH PROPOFOL N/A 08/09/2022   Procedure: COLONOSCOPY WITH PROPOFOL;  Surgeon: Meridee Score Netty Starring., MD;  Location: [facility];  Service: Gastroenterology;  Laterality: N/A;   ORIF ANKLE FRACTURE Left 11/26/2018   Procedure: OPEN REDUCTION INTERNAL FIXATION (ORIF) ANKLE FRACTURE;  Surgeon: Bjorn Pippin, MD;  Location: [facility] OR;  Service: Orthopedics;  Laterality: Left;   ORIF SHOULDER FRACTURE Right    Arthroplasty    POLYPECTOMY  08/09/2022   Procedure: POLYPECTOMY;  Surgeon: Mansouraty, Netty Starring., MD;  Location: [facility];  Service: Gastroenterology;;   self orchectomy Bilateral    SHOULDER CLOSED REDUCTION Left 09/16/2013   Procedure: CLOSED REDUCTION SHOULDER;  Surgeon: Shelda Pal, MD;  Location: [facility];  Service: Orthopedics;  Laterality: Left;   SHOULDER HEMI-ARTHROPLASTY Left 09/18/2013   Procedure: LEFT SHOULDER HEMI-ARTHROPLASTY;  Surgeon: Verlee Rossetti, MD;  Location: [facility] OR;  Service: Orthopedics;  Laterality: Left;   TIBIA IM NAIL INSERTION Left 11/26/2018   Procedure: INTRAMEDULLARY (IM) NAIL TIBIAL;  Surgeon: Bjorn Pippin, MD;  Location: [facility] OR;  Service: Orthopedics;  Laterality: Left;   UPPER GASTROINTESTINAL ENDOSCOPY  07/23/2018   Dr.Jacobs    Family Psychiatric History: Reviewed  Family History:  Family History  Problem Relation Age of Onset   Stroke Mother        Stroke in late 71's. Died in her 10's   Fibromyalgia Mother    Stroke Father        Living at 32   Alcohol abuse Brother    Alcohol abuse Brother    Diabetes Brother    Colon cancer Neg Hx    Esophageal cancer Neg Hx    Stomach cancer Neg Hx    Rectal cancer Neg Hx     Social History:  Social History   Socioeconomic History   Marital status: Single    Spouse name: Not on file   Number of children: 0   Years of education: 18   Highest education level: Not on file  Occupational History   Occupation: disabled  Tobacco Use   Smoking status: Never    Passive exposure: Never   Smokeless tobacco: Never  Vaping Use   Vaping status: Never Used  Substance and Sexual Activity   Alcohol use: No    Alcohol/week: 0.0 standard drinks of alcohol   Drug use: No   Sexual activity: Not Currently    Partners: Female    Birth control/protection: None  Other Topics Concern   Not on file  Social History Narrative   Patient does not drink caffeine.   Patient is left handed.   Social Determinants of Health    Financial Resource Strain: Low Risk  (08/04/2018)   Overall Financial Resource Strain (CARDIA)    Difficulty of Paying Living Expenses: Not hard at all  Food Insecurity: No Food Insecurity (08/04/2018)   Hunger Vital Sign    Worried About Running Out of Food in the Last Year: Never true    Ran Out of Food in the Last Year: Never true  Transportation Needs: No Transportation Needs (08/04/2018)   PRAPARE - Administrator, Civil Service (Medical): No    Lack of Transportation (Non-Medical): No  Physical Activity: Inactive (08/04/2018)  Exercise Vital Sign    Days of Exercise per Week: 0 days    Minutes of Exercise per Session: 0 min  Stress: No Stress Concern Present (08/04/2018)   [employer] of Occupational Health - Occupational Stress Questionnaire    Feeling of Stress : Only a little  Social Connections: Unknown (08/04/2018)   Social Connection and Isolation Panel [NHANES]    Frequency of Communication with Friends and Family: More than three times a week    Frequency of Social Gatherings with Friends and Family: More than three times a week    Attends Religious Services: Never    Database administrator or Organizations: No    Attends Banker Meetings: Never    Marital Status: Not on file    Allergies:  Allergies  Allergen Reactions   Codeine Itching and Nausea And Vomiting   Sulfa Antibiotics Nausea And Vomiting   Serotonin Reuptake Inhibitors (Ssris)     Restless   Sulfasalazine Nausea And Vomiting    Metabolic Disorder Labs: Lab Results  Component Value Date   HGBA1C 6.0 11/18/2023   MPG 111 07/30/2015   MPG 123 07/13/2015   Lab Results  Component Value Date   PROLACTIN 12.0 07/13/2015   Lab Results  Component Value Date   CHOL 86 (L) 11/18/2023   TRIG 152 (H) 11/18/2023   HDL 35 (L) 11/18/2023   CHOLHDL 2.5 11/18/2023   VLDL 28 02/18/2017   LDLCALC 25 11/18/2023   LDLCALC 47 08/28/2022   Lab Results  Component Value Date    TSH 1.240 05/25/2021   TSH 3.430 03/17/2020    Therapeutic Level Labs: No results found for: "LITHIUM" No results found for: "VALPROATE" No results found for: "CBMZ"  Current Medications: Current Outpatient Medications  Medication Sig Dispense Refill   amitriptyline (ELAVIL) 100 MG tablet Take 2 tablets (200 mg total) by mouth at bedtime. 60 tablet 2   atorvastatin (LIPITOR) 40 MG tablet Take 1 tablet (40 mg total) by mouth daily. 90 tablet 3   Lacosamide (VIMPAT) 150 MG TABS Take 1 tablet (150 mg total) by mouth 2 (two) times daily. 180 tablet 1   metFORMIN (GLUCOPHAGE-XR) 500 MG 24 hr tablet Take 2 tablets (1,000 mg total) by mouth daily. 180 tablet 3   metoprolol succinate (TOPROL-XL) 100 MG 24 hr tablet Take 1 tablet (100 mg total) by mouth daily. Take with or immediately following a meal. 90 tablet 3   Multiple Vitamin (MULTIVITAMIN WITH MINERALS) TABS tablet Take 1 tablet by mouth as needed.     testosterone cypionate (DEPOTESTOSTERONE CYPIONATE) 200 MG/ML injection Inject 200 mg into the skin every 14 (fourteen) days.     Zoster Vaccine Adjuvanted [facility]) injection Administer Shingrix vaccination now and repeat in two months 1 each 1   aspirin EC 81 MG tablet Take 81 mg by mouth at bedtime. Swallow whole. (Patient not taking: Reported on 07/30/2023)     QUEtiapine (SEROQUEL) 300 MG tablet Take 1 tablet (300 mg total) by mouth at bedtime. (Patient not taking: Reported on 11/18/2023) 30 tablet 2   Suvorexant (BELSOMRA) 10 MG TABS Take 1 tablet (10 mg total) by mouth at bedtime. (Patient not taking: Reported on 11/18/2023) 30 tablet 2   No current facility-administered medications for this visit.     Musculoskeletal: Strength & Muscle Tone: within normal limits Gait & Station: normal Patient leans: N/A  Psychiatric Specialty Exam: Review of Systems  Psychiatric/Behavioral:  Positive for decreased concentration, dysphoric mood and sleep  disturbance. The patient is  nervous/anxious.     Blood pressure (!) 149/89, pulse 78, height 5\' 9"  (1.753 m), weight 202 lb (91.6 kg).Body mass index is 29.83 kg/m.  General Appearance: Fairly Groomed  Eye Contact:  Fair  Speech:  Slow  Volume:  Decreased  Mood:  Anxious  Affect:  Congruent  Thought Process:  Descriptions of Associations: Intact  Orientation:  Full (Time, Place, and Person)  Thought Content: Rumination   Suicidal Thoughts:  No  Homicidal Thoughts:  No  Memory:  Immediate;   Fair Recent;   Fair Remote;   Fair  Judgement:  Fair  Insight:  Shallow  Psychomotor Activity:  Decreased  Concentration:  Concentration: Fair and Attention Span: Fair  Recall:  [employer] of Knowledge: Fair  Language: Good  Akathisia:  No  Handed:  Right  AIMS (if indicated): not done  Assets:  Communication Skills Desire for Improvement Housing  ADL's:  Impaired  Cognition: WNL  Sleep:  Poor   Screenings: AIMS    Flowsheet Row [facility]  AIMS Total Score 0      AUDIT    Flowsheet Row [facility]) [facility] Ad[facility] ([facility] Ad[facility] (Discharged[facility]  Alcohol Use Disorder Identification Test Final Score (AUDIT) 0 0 0      Mini-Mental    Flowsheet Row Office Visit from 10/07/2019 in [facility] Office Visit from 05/15/2018 in [facility] Office Visit from 10/25/2017 in [facility]  Total Score (max 30 points ) 27 27 25       PHQ2-9    Flowsheet Row Office Visit from 11/18/2023 in [facility] [place]. [facility] Office Visit from 04/26/2023 in [facility] [place]. [facility] Office Visit from 01/01/2023 in [facility] [place]. [facility] Office Visit from 11/19/2022 in [facility] . [facility] Office Visit from 08/28/2022 in [facility]. [facility]  PHQ-2 Total Score 4 5 4 3 4   PHQ-9 Total Score 13 17 14 15 17       Flowsheet Row Admission (Discharged) from 08/09/2022 in [facility] ED from 10/27/2021 in [facility] at [facility] Video Visit from [facility]20[facility]  C-SSRS RISK CATEGORY No Risk No Risk No Risk        Assessment and Plan: Review blood work results.  Noncompliant with Seroquel and Belsomra.  I do believe his memory is getting worse.  He do not recall why he stopped taking the Seroquel and what happened to his insurance.  He used to take moderate dose of Seroquel 900 mg and amitriptyline 400 mg but slowly and gradually dose has been reduced.  On the last visit he was taking Seroquel 300 mg at bedtime but now he is not taking.  I discussed given the history of past hospitalization with severe depression and psychosis he need to go back to Seroquel if he has no other concerns or side effects.  He is not sleeping well  which could be the reason secondary to noncompliant with Belsomra and Seroquel.  Patient told he cannot afford Belsomra at this time.  However he agreed to go back on quetiapine and dose will be 100 mg only.  I reviewed blood work results and notes from primary care.  I will also message his primary care for neurology referral and neuropsych testing.  Hemoglobin A1c is 6.0 which is stable for more than a year.  Recommend to call us back if he has any question or any concern.  We will follow-up in 3 months.    Collaboration of Care: Collaboration of Care: Other provider involved in patient's care AEB notes are  available in epic to review  Patient/Guardian was advised Release of Information must be obtained prior to any record release in order to collaborate their care with an outside provider. Patient/Guardian was advised if they have not already done so to contact the registration department to sign all necessary forms in order for [place] to release information regarding their care.   Consent: Patient/Guardian gives verbal consent for treatment and assignment of benefits for services provided during this visit. Patient/Guardian expressed understanding and agreed to proceed.   I provided 28 minutes face-to-face time during this encounter.  Cleotis Nipper, MD 11/21/2023, 3:53 PM

## 2023-11-22 NOTE — Progress Notes (Unsigned)
  Care Coordination  Outreach Note  11/22/2023 Name: ORPHEUS SCHMICK MRN: [id number] DOB: January 14, 1956   Care Coordination Outreach Attempts: A second unsuccessful outreach was attempted today to offer the patient with information about available care coordination services.  Follow Up Plan:  Additional outreach attempts will be made to offer the patient care coordination information and services.   Encounter Outcome:  No Answer  Gwenevere Ghazi  Care Coordination Care Guide  Direct Dial: [phone]

## 2023-11-25 NOTE — Progress Notes (Signed)
  Care Coordination  Outreach Note  11/25/2023 Name: BOYDEN BRANOM MRN: [id number] DOB: 1956/07/08   Care Coordination Outreach Attempts: A third unsuccessful outreach was attempted today to offer the patient with information about available care coordination services.  Follow Up Plan:  No further outreach attempts will be made at this time. We have been unable to contact the patient to offer or enroll patient in care coordination services  Encounter Outcome:  No Answer  Gwenevere Ghazi  Care Coordination Care Guide  Direct Dial: [phone]

## 2023-12-12 ENCOUNTER — Telehealth: Payer: Self-pay | Admitting: *Deleted

## 2023-12-12 NOTE — Progress Notes (Unsigned)
Complex Care Management Note Care Guide Note  12/12/2023 Name: [name] MRN: [id number] DOB: September 15, 1956   Complex Care Management Outreach Attempts: A telephone outreach was attempted today to offer the patient information about available complex care management services.  Follow Up Plan:  Additional outreach attempts will be made to offer the patient complex care management information and services.   Encounter Outcome:  Patient Request to Call Back  [facility] Coordination Care Guide  Direct Dial: [phone]

## 2023-12-22 ENCOUNTER — Other Ambulatory Visit (HOSPITAL_COMMUNITY): Payer: Self-pay | Admitting: Psychiatry

## 2023-12-22 DIAGNOSIS — F250 Schizoaffective disorder, bipolar type: Secondary | ICD-10-CM

## 2023-12-23 NOTE — Progress Notes (Signed)
 Complex Care Management Care Guide Note  12/23/2023 Name: [name] MRN: [id number] DOB: 1956-01-21  [name] is a 68 y.o. year old male who is a primary care patient of [name], MD and is actively engaged with the care management team. I reached out to [name] by phone today to assist with scheduling  with the RN Case Manager Licensed Clinical Social Worker.  Follow up plan: Unsuccessful telephone outreach attempt made. A HIPAA compliant phone message was left for the patient providing contact information and requesting a return call. No additional outreaches will be made.   [facility]  Care Coordination Care Guide  Direct Dial: [phone]

## 2024-01-03 ENCOUNTER — Telehealth: Payer: Self-pay | Admitting: *Deleted

## 2024-01-03 DIAGNOSIS — E1169 Type 2 diabetes mellitus with other specified complication: Secondary | ICD-10-CM

## 2024-01-03 DIAGNOSIS — I10 Essential (primary) hypertension: Secondary | ICD-10-CM

## 2024-01-06 ENCOUNTER — Telehealth: Payer: Self-pay | Admitting: *Deleted

## 2024-01-06 NOTE — Progress Notes (Signed)
Complex Care Management Note Care Guide Note  01/06/2024 Name: STATON BYFIELD MRN: [id number] DOB: 08-Oct-1956   Complex Care Management Outreach Attempts: An unsuccessful telephone outreach was attempted today to offer the patient information about available complex care management services.  Follow Up Plan:  Additional outreach attempts will be made to offer the patient complex care management information and services.   Encounter Outcome:  No Answer Dione Booze  [place]  [facility] Guide  Direct [phone] Website: [email or web address]

## 2024-01-07 ENCOUNTER — Telehealth: Payer: Self-pay | Admitting: *Deleted

## 2024-01-07 NOTE — Progress Notes (Signed)
Complex Care Management Note Care Guide Note  01/07/2024 Name: [name] MRN: [id number] DOB: [birth year]   Complex Care Management Outreach Attempts: An unsuccessful telephone outreach was attempted today to offer the patient information about available complex care management services.  Follow Up Plan:  Additional outreach attempts will be made to offer the patient complex care management information and services.   Encounter Outcome:  No Answer  [facility] Care Guide  Direct Dial:[phone] Fax:[phone] Website: [email or web address]

## 2024-01-08 ENCOUNTER — Telehealth: Payer: Self-pay | Admitting: *Deleted

## 2024-01-09 ENCOUNTER — Telehealth: Payer: Self-pay | Admitting: *Deleted

## 2024-01-09 NOTE — Progress Notes (Signed)
Complex Care Management Note Care Guide Note  01/09/2024 Name: SETH DONNEL MRN: [id number] DOB: [birth year]   Complex Care Management Outreach Attempts: A second unsuccessful outreach was attempted today to offer the patient with information about available complex care management services.  Follow Up Plan:  Additional outreach attempts will be made to offer the patient complex care management information and services.   Encounter Outcome:  No Answer  [facility]Population Health Care Guide  Direct Dial:[phone] Fax:[phone] Website: [email or web address]

## 2024-01-10 ENCOUNTER — Telehealth: Payer: Self-pay | Admitting: *Deleted

## 2024-01-10 NOTE — Progress Notes (Signed)
Complex Care Management Note Care Guide Note  01/10/2024 Name: [name] MRN: [id number] DOB: [birth year]   Complex Care Management Outreach Attempts: A third unsuccessful outreach was attempted today to offer the patient with information about available complex care management services.  Follow Up Plan:  No further outreach attempts will be made at this time. We have been unable to contact the patient to offer or enroll patient in complex care management services.  Encounter Outcome:  No Answer [facility] Care Guide  Direct Dial:[phone] [phone] Website: [email or web address]

## 2024-01-13 ENCOUNTER — Telehealth: Payer: Self-pay | Admitting: *Deleted

## 2024-01-13 NOTE — Progress Notes (Signed)
Complex Care Management Note Care Guide Note  01/13/2024 Name: [name] MRN: [id number] DOB: [birth year]  [name] is a 68 y.o. year old male who is a primary care patient of Pollie Meyer Estevan Ryder, MD . The community resource team was consulted for assistance with Transportation Needs   SDOH screenings and interventions completed:  No     Care guide performed the following interventions: Going to call back 2:00 tomorrow to complete transportation application .  Follow Up Plan:  Care guide will follow up with patient by phone over the next day  Encounter Outcome:  Patient Visit Completed  Dione Booze  [facility] HealthPopulation Health Care Guide  Direct Dial:[phone] Fax:[phone] Website: [email or web address]

## 2024-01-14 ENCOUNTER — Telehealth: Payer: Self-pay | Admitting: *Deleted

## 2024-01-14 NOTE — Progress Notes (Signed)
Complex Care Management Note Care Guide Note  01/14/2024 Name: [name] MRN: [id number] DOB: 1956/08/09  [name] is a 68 y.o. year old male who is a primary care patient of Pollie Meyer, Estevan Ryder, MD . The community resource team was consulted for assistance with Transportation Needs   SDOH screenings and interventions completed:  Yes  SDOH Interventions Today    Flowsheet Row Most Recent Value  SDOH Interventions   Food Insecurity Interventions [id number] Referral  Transportation Interventions [facility] ([employer])  [Completed access application]        Care guide performed the following interventions: Patient provided with information about care guide support team and interviewed to confirm resource needs. Patient part A completed application and part B faxed to dr , mailed food banks   Follow Up Plan:  No further follow up planned at this time. The patient has been provided with needed resources.  Encounter Outcome:  Patient Visit Completed  Dione Booze  [facility] HealthPopulation Health Care Guide  Direct Dial:[phone] Fax:[phone] Website: [email or web address]

## 2024-01-22 ENCOUNTER — Telehealth: Payer: Self-pay | Admitting: Family Medicine

## 2024-01-22 NOTE — Telephone Encounter (Signed)
 Pt called to verify appointment

## 2024-01-27 ENCOUNTER — Telehealth: Payer: Self-pay | Admitting: *Deleted

## 2024-01-28 NOTE — Telephone Encounter (Signed)
Received SCAT forms faxed Completed form, will return to SCAT office via fax Latrelle Dodrill, MD

## 2024-02-03 ENCOUNTER — Other Ambulatory Visit: Payer: Self-pay | Admitting: Family Medicine

## 2024-02-03 MED ORDER — LACOSAMIDE 150 MG PO TABS: 1.0000 | ORAL_TABLET | Freq: Two times a day (BID) | ORAL | 1 refills | Status: DC

## 2024-02-03 NOTE — Telephone Encounter (Signed)
Last seen 07/30/23 and next f/u 07/29/24. Last refilled 07/18/23 #180.  Last refill sent 07/30/23 #180, 1 refill. I called [facility] and spoke w/ tech. Rx expired, new rx needed.

## 2024-02-03 NOTE — Telephone Encounter (Signed)
Pt request refill  for Lacosamide (VIMPAT) 150 MG TABS send to  [id number]

## 2024-02-04 ENCOUNTER — Other Ambulatory Visit: Payer: Self-pay | Admitting: *Deleted

## 2024-02-04 MED ORDER — LACOSAMIDE 150 MG PO TABS: 1.0000 | ORAL_TABLET | Freq: Two times a day (BID) | ORAL | 1 refills | Status: DC

## 2024-02-20 ENCOUNTER — Ambulatory Visit (HOSPITAL_COMMUNITY): Payer: Self-pay | Admitting: Psychiatry

## 2024-03-05 ENCOUNTER — Other Ambulatory Visit: Payer: Self-pay | Admitting: Family Medicine

## 2024-03-05 MED ORDER — LACOSAMIDE 150 MG PO TABS: 1.0000 | ORAL_TABLET | Freq: Two times a day (BID) | ORAL | 1 refills | Status: DC

## 2024-03-05 NOTE — Telephone Encounter (Signed)
 Last seen 07/30/23, next appt scheduled 07/29/24  Dispenses   Dispensed Days Supply Quantity Provider Pharmacy  LACOSAMIDE 150 MG TABLET 02/04/2024 90 180 each Huston Foley, MD [id number]  LACOSAMIDE 150 MG TABLET 07/18/2023 90 180 each Lomax, Amy, NP [id number]  LACOSAMIDE 150 MG TABLET 04/22/2023 90 180 each Lomax, Amy, NP [id number] [place]

## 2024-03-05 NOTE — Telephone Encounter (Signed)
 Pt request refill  for Lacosamide (VIMPAT) 150 MG TABS send to  [id number]

## 2024-05-04 ENCOUNTER — Other Ambulatory Visit: Payer: Self-pay | Admitting: Family Medicine

## 2024-06-06 ENCOUNTER — Other Ambulatory Visit: Payer: Self-pay | Admitting: Family Medicine

## 2024-07-18 ENCOUNTER — Encounter (HOSPITAL_COMMUNITY): Payer: Self-pay | Admitting: *Deleted

## 2024-07-18 ENCOUNTER — Emergency Department (HOSPITAL_COMMUNITY): Payer: Self-pay

## 2024-07-18 ENCOUNTER — Other Ambulatory Visit: Payer: Self-pay

## 2024-07-18 ENCOUNTER — Emergency Department (HOSPITAL_COMMUNITY)
Admission: EM | Admit: 2024-07-18 | Discharge: 2024-07-18 | Disposition: A | Discharge status: Home / Self Care | Payer: Self-pay | Attending: Emergency Medicine | Admitting: Emergency Medicine

## 2024-07-18 DIAGNOSIS — R0602 Shortness of breath: Secondary | ICD-10-CM | POA: Insufficient documentation

## 2024-07-18 LAB — COMPREHENSIVE METABOLIC PANEL WITH GFR
ALT: 49 U/L — ABNORMAL HIGH (ref 0–44)
AST: 33 U/L (ref 15–41)
Albumin: 3.7 g/dL (ref 3.5–5.0)
Alkaline Phosphatase: 81 U/L (ref 38–126)
Anion gap: 10 (ref 5–15)
BUN: 21 mg/dL (ref 8–23)
CO2: 24 mmol/L (ref 22–32)
Calcium: 9.4 mg/dL (ref 8.9–10.3)
Chloride: 103 mmol/L (ref 98–111)
Creatinine, Ser: 0.95 mg/dL (ref 0.61–1.24)
GFR, Estimated: >60 mL/min (ref >60)
Glucose, Bld: 127 mg/dL — ABNORMAL HIGH (ref 70–99)
Potassium: 4.3 mmol/L (ref 3.5–5.1)
Sodium: 137 mmol/L (ref 135–145)
Total Bilirubin: 0.6 mg/dL (ref 0.0–1.2)
Total Protein: 7.2 g/dL (ref 6.5–8.1)

## 2024-07-18 LAB — CBC
HCT: 40.2 % (ref 39.0–52.0)
Hemoglobin: 13.5 g/dL (ref 13.0–17.0)
MCH: 31.7 pg (ref 26.0–34.0)
MCHC: 33.6 g/dL (ref 30.0–36.0)
MCV: 94.4 fL (ref 80.0–100.0)
Platelets: 188 K/uL (ref 150–400)
RBC: 4.26 MIL/uL (ref 4.22–5.81)
RDW: 13.2 % (ref 11.5–15.5)
WBC: 7.8 K/uL (ref 4.0–10.5)
nRBC: 0 % (ref 0.0–0.2)

## 2024-07-18 LAB — BRAIN NATRIURETIC PEPTIDE: B Natriuretic Peptide: 25.7 pg/mL (ref 0.0–100.0)

## 2024-07-18 LAB — D-DIMER, QUANTITATIVE: D-Dimer, Quant: <0.27 ug{FEU}/mL (ref 0.00–0.50)

## 2024-07-18 MED ORDER — SODIUM CHLORIDE 0.9% FLUSH
3.0000 mL | Freq: Once | INTRAVENOUS | Status: AC
  Administered 2024-07-18: 3 mL via INTRAVENOUS

## 2024-07-18 MED ORDER — AZITHROMYCIN 250 MG PO TABS
500.0000 mg | ORAL_TABLET | Freq: Once | ORAL | Status: AC
  Administered 2024-07-18: 500 mg via ORAL
  Filled 2024-07-18: qty 2

## 2024-07-18 MED ORDER — IOHEXOL 350 MG/ML SOLN
75.0000 mL | Freq: Once | INTRAVENOUS | Status: AC | PRN
  Administered 2024-07-18: 75 mL via INTRAVENOUS

## 2024-07-18 MED ORDER — PREDNISONE 20 MG PO TABS: 40.0000 mg | ORAL_TABLET | Freq: Every day | ORAL | 0 refills | Status: DC

## 2024-07-18 MED ORDER — PREDNISONE 20 MG PO TABS
60.0000 mg | ORAL_TABLET | ORAL | Status: AC
  Administered 2024-07-18: 60 mg via ORAL
  Filled 2024-07-18: qty 3

## 2024-07-18 MED ORDER — ALBUTEROL SULFATE HFA 108 (90 BASE) MCG/ACT IN AERS
1.0000 | INHALATION_SPRAY | Freq: Once | RESPIRATORY_TRACT | Status: AC
  Administered 2024-07-18: 1 via RESPIRATORY_TRACT
  Filled 2024-07-18: qty 6.7

## 2024-07-18 MED ORDER — AZITHROMYCIN 250 MG PO TABS: 250.0000 mg | ORAL_TABLET | Freq: Every day | ORAL | 0 refills | Status: AC

## 2024-07-18 NOTE — Discharge Instructions (Signed)
 As discussed, today's evaluation been generally reassuring.  However, with ongoing shortness of breath it is very important follow-up with your primary care physician.  Call Monday for an appointment.  Use the provided albuterol  every 4 hours for the next 48 hours and then as needed.  In addition, obtain and complete the course of azithromycin  and prednisone .

## 2024-07-18 NOTE — ED Triage Notes (Signed)
 BIB [facility] from [facility] for sob. LS decreased on L. Resps shallow. Alert, NAD, calm, interactive, resps e/u, speaking in clear complete sentences. Skin pale, dry. Denies fever, pain, cough, dizziness or other sx.

## 2024-07-18 NOTE — ED Notes (Signed)
 Discharge instructions reviewed with patient. Patient questions answered and opportunity for education reviewed. Patient voices understanding of discharge instructions with no further questions. Patient To lobby via wheelchair.

## 2024-07-18 NOTE — ED Provider Notes (Signed)
 [place] EMERGENCY DEPARTMENT AT [facility] Provider Note   CSN: [id number] Arrival date & time: 07/18/24  1337     Patient presents with: Shortness of Breath   [name] is a 68 y.o. male.   HPI   Patient presents with shortness of breath.  Onset a few days ago, no obvious precipitant.  Since that time has had shortness of breath with minimal exertion, no chest pain, no fever, no chills. No change in medication, diet, activity. Denies history of pulmonary disease.  Prior to Admission medications   Medication Sig Start Date End Date Taking? Authorizing Provider  amitriptyline  (ELAVIL ) 100 MG tablet Take 2 tablets (200 mg total) by mouth at bedtime. 11/21/23  Yes Arfeen, Leni DASEN, MD  atorvastatin  (LIPITOR) 40 MG tablet Take 1 tablet (40 mg total) by mouth daily. Patient taking differently: Take 40 mg by mouth at bedtime. 11/18/23  Yes [name], MD  azithromycin  (ZITHROMAX ) 250 MG tablet Take 1 tablet (250 mg total) by mouth daily for 4 days. Take 1 every day until finished. 07/18/24 07/22/24 Yes Garrick Charleston, MD  Lacosamide  (VIMPAT ) 150 MG TABS Take 1 tablet (150 mg total) by mouth 2 (two) times daily. Patient taking differently: Take 1 tablet by mouth in the morning and at bedtime. 03/05/24  Yes Lomax, Amy, NP  metFORMIN  (GLUCOPHAGE -XR) 500 MG 24 hr tablet Take 2 tablets (1,000 mg total) by mouth daily. Patient taking differently: Take 1,000 mg by mouth at bedtime. 11/18/23  Yes [name], MD  metoprolol  succinate (TOPROL -XL) 100 MG 24 hr tablet Take 1 tablet (100 mg total) by mouth daily. Take with or immediately following a meal. Patient taking differently: Take 50 mg by mouth at bedtime. Take with or immediately following a meal. 11/18/23  Yes [name], MD  predniSONE  (DELTASONE ) 20 MG tablet Take 2 tablets (40 mg total) by mouth daily with breakfast. For the next four days 07/18/24  Yes Garrick Charleston, MD  QUEtiapine  (SEROQUEL ) 100 MG  tablet Take 1 tablet (100 mg total) by mouth at bedtime. Patient not taking: Reported on 07/18/2024 11/21/23   Arfeen, Leni DASEN, MD  Suvorexant  (BELSOMRA ) 10 MG TABS Take 1 tablet (10 mg total) by mouth at bedtime. Patient not taking: Reported on 07/18/2024 08/20/23   Arfeen, Syed T, MD  Zoster Vaccine Adjuvanted (SHINGRIX ) injection Administer Shingrix  vaccination now and repeat in two months Patient not taking: Reported on 07/18/2024 11/18/23   [name], MD    Allergies: Codeine, Sulfa antibiotics, and Serotonin reuptake inhibitors (ssris)    Review of Systems  Updated Vital Signs BP (!) 165/84   Pulse 67   Temp 98.1 F (36.7 C) (Oral)   Resp 20   Wt 101.6 kg   SpO2 100%   BMI 33.08 kg/m   Physical Exam Vitals and nursing note reviewed.  Constitutional:      General: He is not in acute distress.    Appearance: He is well-developed.  HENT:     Head: Normocephalic and atraumatic.  Eyes:     Conjunctiva/sclera: Conjunctivae normal.  Cardiovascular:     Rate and Rhythm: Normal rate and regular rhythm.  Pulmonary:     Effort: Pulmonary effort is normal. No respiratory distress.     Breath sounds: No stridor. Decreased breath sounds present. No wheezing.  Abdominal:     General: There is no distension.     Comments: Protuberant, nontender abdomen  Musculoskeletal:     Right  lower leg: Edema present.     Left lower leg: Edema present.  Skin:    General: Skin is warm and dry.  Neurological:     Mental Status: He is alert and oriented to person, place, and time.     (all labs ordered are listed, but only abnormal results are displayed) Labs Reviewed  COMPREHENSIVE METABOLIC PANEL WITH GFR - Abnormal; Notable for the following components:      Result Value   Glucose, Bld 127 (*)    ALT 49 (*)    All other components within normal limits  CBC  BRAIN NATRIURETIC PEPTIDE  D-DIMER, QUANTITATIVE    EKG: EKG Interpretation Date/Time:  Saturday July 18 2024 14:39:30  EDT Ventricular Rate:  68 PR Interval:  172 QRS Duration:  108 QT Interval:  400 QTC Calculation: 426 R Axis:   -9  Text Interpretation: Sinus rhythm Borderline T abnormalities, inferior leads Confirmed by Garrick Charleston [id number]) on 07/18/2024 2:53:47 PM  Radiology: CT Angio Chest PE W and/or Wo Contrast Result Date: 07/18/2024 CLINICAL DATA:  Shortness of breath EXAM: CT ANGIOGRAPHY CHEST WITH CONTRAST TECHNIQUE: Multidetector CT imaging of the chest was performed using the standard protocol during bolus administration of intravenous contrast. Multiplanar CT image reconstructions and MIPs were obtained to evaluate the vascular anatomy. RADIATION DOSE REDUCTION: This exam was performed according to the departmental dose-optimization program which includes automated exposure control, adjustment of the mA and/or kV according to patient size and/or use of iterative reconstruction technique. CONTRAST:  75mL OMNIPAQUE  IOHEXOL  350 MG/ML SOLN COMPARISON:  Chest x-ray 07/18/2024, chest CT 03/19/2012 FINDINGS: Cardiovascular: Images slightly degraded by habitus and streak artifact. No definite acute pulmonary embolus is seen to the segmental level. Generous appearing central pulmonary arteries. No pericardial effusion. Nonaneurysmal aorta. Mild coronary vascular calcification Mediastinum/Nodes: Patent trachea. No thyroid mass. Mildly prominent prevascular lymph node measuring 9 mm. Esophagus within normal limits. Lungs/Pleura: No pleural effusion or consolidative airspace disease. Bilateral mosaic attenuation. Bandlike scar atelectasis in the right upper lobe. Probable hazy atelectasis in the lower lobes. Upper Abdomen: Hepatic steatosis.  No acute finding Musculoskeletal: Old sternal fracture. Old rib fractures. Chronic mild wedging deformities of T4, T5 and T6. Review of the MIP images confirms the above findings. IMPRESSION: 1. Images slightly degraded by habitus and streak artifact. No definite acute pulmonary  embolus is seen to the segmental level. 2. Bilateral mosaic attenuation of the lungs, nonspecific but can be seen with small airways disease. No consolidative pneumonia. 3. Hepatic steatosis. Electronically Signed   By: Luke Bun M.D.   On: 07/18/2024 19:12   DG Chest 2 View Result Date: 07/18/2024 CLINICAL DATA:  sob EXAM: CHEST - 2 VIEW COMPARISON:  Chest x-ray 06/27/2021, CT chest 03/19/2012, CT abdomen pelvis 10/26/2021 FINDINGS: The heart and mediastinal contours are within normal limits. Epicardial fat again noted. Low lung volumes. Bibasilar atelectasis. No focal consolidation. No pulmonary edema. No pleural effusion. No pneumothorax. No acute osseous abnormality.  Total left shoulder arthroplasty. IMPRESSION: Low lung volumes with bibasilar atelectasis. Recommend repeat chest x-ray PA and lateral view with improved inspiratory effort. Electronically Signed   By: Morgane  Naveau M.D.   On: 07/18/2024 15:19     Procedures   Medications Ordered in the ED  albuterol  (VENTOLIN  HFA) 108 (90 Base) MCG/ACT inhaler 1 puff (has no administration in time range)  predniSONE  (DELTASONE ) tablet 60 mg (has no administration in time range)  azithromycin  (ZITHROMAX ) tablet 500 mg (has no administration in time range)  sodium chloride  flush (NS) 0.9 % injection 3 mL (3 mLs Intravenous Given 07/18/24 1553)  iohexol  (OMNIPAQUE ) 350 MG/ML injection 75 mL (75 mLs Intravenous Contrast Given 07/18/24 1842)                                    Medical Decision Making Elderly male presents with shortness of breath with exertion.  Broad differential including heart failure, hepatic or renal dysfunction, less likely PE, though this is a consideration.  No evidence for ACS, with no chest pain. Cardiac 80 sinus normal pulse ox 99% room air normal  Amount and/or Complexity of Data Reviewed Labs: ordered. Radiology: ordered. ECG/medicine tests: ordered and independent interpretation performed. Decision-making  details documented in ED Course.  Risk Prescription drug management.   8:27 PM CT without evidence for pneumonia, PE. Patient remains hemodynamically stable though he does have dyspnea with exertion. Some consideration of bronchitis given the patient's minimal symptoms at rest, reassuring evaluation here.  Pulmonary hypertension possibility that the patient has no history of this on chart review.  No evidence for ACS, heart failure, PE, pneumonia, bacteremia, sepsis.  Patient will start meds, follow-up closely with outpatient doctor.     Final diagnoses:  SOB (shortness of breath)    ED Discharge Orders          Ordered    predniSONE  (DELTASONE ) 20 MG tablet  Daily with breakfast        07/18/24 2027    azithromycin  (ZITHROMAX ) 250 MG tablet  Daily        07/18/24 2027               Garrick Charleston, MD 07/18/24 2027

## 2024-07-19 ENCOUNTER — Other Ambulatory Visit (HOSPITAL_COMMUNITY): Payer: Self-pay | Admitting: Psychiatry

## 2024-07-19 DIAGNOSIS — F411 Generalized anxiety disorder: Secondary | ICD-10-CM

## 2024-07-22 ENCOUNTER — Telehealth: Payer: Self-pay | Admitting: Student

## 2024-07-22 NOTE — Telephone Encounter (Signed)
**  After Hours/ Emergency Line Call**  Received a page to call [phone]) - [phone].  Patient: [name]  Caller: Self  Confirmed name & DOB of patient with caller  Subjective:  Diagnosed with Bronchitis in the ED, was given prednisone  and azithromycin .  Reports hyperventilating with activity. Non-smoker. Work-up in ED negative for ACS and pneumonia. Denies any fevers. Taking albuterol  1 puff every 4 hours. Reports no improvement in symptoms, maybe worse. Feels weak. No productive cough. No history of ACS.  Objective:  Observations: NAD, speaking in full sentences   Assessment & Plan  EDRIAN MELUCCI is a 68 y.o. male with PMHx s/f type 2 diabetes (well-controlled), seizure disorder, MDD, schizoaffective disorder, sleep apnea and TIA who calls with the following complaints and concerns: Shortness of breath on exertion.  Recently seen in the ED on 8/2 with ACS workup unremarkable, CT PE negative for PE or significant pneumonia.  He was diagnosed with bronchitis.  Given short course of prednisone /azithromycin  and albuterol  inhaler.  Differential includes: Bronchitis, viral URI, sinus infection, developing pneumonia  Recommendations:  Recommend patient be seen in our clinic tomorrow Continue albuterol  inhaler, 2 puffs every 4 hours as needed Red flag symptoms discussed with ED precautions  -- Will forward to PCP.  Gladis Church, DO [facility], PGY-2

## 2024-07-23 ENCOUNTER — Ambulatory Visit (INDEPENDENT_AMBULATORY_CARE_PROVIDER_SITE_OTHER): Payer: Self-pay | Admitting: Family Medicine

## 2024-07-23 VITALS — BP 130/74 | HR 69 | Wt 222.0 lb

## 2024-07-23 DIAGNOSIS — J209 Acute bronchitis, unspecified: Secondary | ICD-10-CM

## 2024-07-23 NOTE — Patient Instructions (Signed)
 Thank you for coming in today! Here is a summary of what we discussed:  - You were diagnosed with bronchitis when he went to the ED last week.  This may just take some time to clear up.  Please let us  know if you have difficulty breathing, fevers, or other symptoms that are concerning.  If you are very concerned I recommend going to the hospital to be evaluated.  - I recommend that you follow-up with your primary care doctor, Dr. Donah, to have an annual exam in the next few weeks (ideally once your bronchitis symptoms have resolved)  - I also recommend that you make an appointment to see Dr. Koval or pharmacist to have pulmonary function testing done.  He sees patients in our office and you can call to make an appoint with him.   Please call the clinic at [phone] if your symptoms worsen or you have any concerns.  Best, Dr Adele

## 2024-07-23 NOTE — Progress Notes (Signed)
    SUBJECTIVE:   CHIEF COMPLAINT / HPI:   Follow-up bronchitis Went to ED on 8/2 for dyspnea, diagnosed with acute bronchitis and prescribed short course of prednisone  and azithromycin .  Completed these medicines.  States he feels better overall but is still having some wheezing.  Also reports ongoing cough with some white mucus.  Denies SOB, chest pain, fever.  Denies history of tobacco use or prior diagnosis of COPD.  Does not believe he has had PFTs done.  PERTINENT  PMH / PSH: HTN, TIA, type II DM, seizures, HLD, schizoaffective disorder  OBJECTIVE:   BP 130/74   Pulse 69   Wt 222 lb (100.7 kg)   SpO2 96%   BMI 32.78 kg/m   Temperature measured, was WNL General: Awake and conversant, no acute distress CV: RRR normal S1-2, no M/R/G Respiratory: Coarse breath sounds at lung bases bilaterally, no wheezing with auscultation, no increased WOB on RA Psych: Appropriate mood and affect.  Responds to questions appropriately Neuro: No focal deficits, normal gait  ASSESSMENT/PLAN:   Assessment & Plan Follow up Acute bronchitis, unspecified organism Patient appears well on exam and is afebrile, normal oxygen saturations, and otherwise hemodynamically stable.  Advised that symptoms may take some time to completely resolve.  Reviewed return precautions.   Recommended patient follow-up with PCP for an annual exam.  Patient prefers scheduling appointment later.    Also recommended scheduling appointment with [facility] pharmacist for PFTs.  Will also send message next week to [facility] front office to call patient and ensure that he has scheduled these follow-up appointments  Rea Raring, MD [facility]

## 2024-07-27 ENCOUNTER — Telehealth (HOSPITAL_COMMUNITY): Payer: Self-pay | Admitting: Psychiatry

## 2024-07-29 ENCOUNTER — Encounter: Payer: Self-pay | Admitting: Family Medicine

## 2024-07-29 ENCOUNTER — Ambulatory Visit: Payer: Medicaid Other | Admitting: Family Medicine

## 2024-07-29 NOTE — Patient Instructions (Incomplete)
 Below is our plan:  We will continue lacosamide  150mg  twice daily.   Please make sure you are consistent with timing of seizure medication. I recommend annual visit with primary care provider (PCP) for complete physical and routine blood work. I recommend daily intake of vitamin D (400-800iu) and calcium  (800-1000mg ) for bone health. Discuss Dexa screening with PCP.   According to [place] law, you can not drive unless you are seizure / syncope free for at least 6 months and under physician's care.  Please maintain precautions. Do not participate in activities where a loss of awareness could harm you or someone else. No swimming alone, no tub bathing, no hot tubs, no driving, no operating motorized vehicles (cars, ATVs, motocycles, etc), lawnmowers, power tools or firearms. No standing at heights, such as rooftops, ladders or stairs. Avoid hot objects such as stoves, heaters, open fires. Wear a helmet when riding a bicycle, scooter, skateboard, etc. and avoid areas of traffic. Set your water heater to 120 degrees or less.  SUDEP is the sudden, unexpected death of someone with epilepsy, who was otherwise healthy. In SUDEP cases, no other cause of death is found when an autopsy is done. Each year, more than 1 in 1,000 people with epilepsy die from SUDEP. This is the leading cause of death in people with uncontrolled seizures. Until further answers are available, the best way to prevent SUDEP is to lower your risk by controlling seizures. Research has found that people with all types of epilepsy that experience convulsive seizures can be at risk.  Please make sure you are staying well hydrated. I recommend 50-60 ounces daily. Well balanced diet and regular exercise encouraged. Consistent sleep schedule with 6-8 hours recommended.   Please continue follow up with care team as directed.   Follow up with me in 1 year   You may receive a survey regarding today's visit. I encourage you to leave honest feed back  as I do use this information to improve patient care. Thank you for seeing me today!

## 2024-07-29 NOTE — Progress Notes (Deleted)
 No chief complaint on file.   HISTORY OF PRESENT ILLNESS:  07/29/24 ALL:  [name] returns for follow up for seizures. He was last seen 07/2023 and doing well. He continues lacosamide  150mg  BID.   07/30/2023 ALL:  [name] returns for follow up for seizures. He continues lacosamide  150mg  BID. Labs performed regularly with PCP. He is due for an appt. He continues to see psychiatry. Mood is good. He is sleeping pretty well. He has chronic insomnia despite psych/sleep aids. He was previously seeing a counselor but hasn't recently. He feels memory is stable. No trouble remembering to take meds. He lives with his girlfriend. He does not drive.   07/03/2022 ALL: [name] returns for follow up for seizures. He continues lacosamide  150mg  BID. He is doing well. Tolerating meds. No seizure activity. He reports mood is stable. He is sleeping well. He continues close follow up with PCP and psychiatry.   04/13/2021 ALL:  [name] is a 68 y.o. male here today for follow up for seizures. He continues Vimpat  and tolerating well. No recent seizure activity. He continues close follow up with psychiatry and PCP. He reports sleeping well. Memory stable. No new or worsening concerns, today.   HISTORY (copied from Dr Jenel' previous note)  [name] is a 68 year old left-handed white male with a history of schizoaffective disorder, and some memory issues.  He does have history of seizures but he has done quite well on Vimpat .  He has not had any further seizures.  He has a chronic issue with insomnia.  He has had neuropsychological testing that did not show an organic dementia, he was felt that he had executive function disorder that was in part related to depression, anxiety, and sleep problems.  The patient is also on multiple medications that are psychoactive including Vimpat , amitriptyline , Seroquel , and oxybutynin.  The patient claims that it takes about 2 hours for him to get to sleep even though he takes 200 mg of  amitriptyline , 5 mg of Ambien , and 400 mg of Seroquel  at night.  The patient returns for an evaluation.  He is currently not operating a motor vehicle but is planning on trying to get his driver's license back.  REVIEW OF SYSTEMS: Out of a complete 14 system review of symptoms, the patient complains only of the following symptoms,memory loss, insomnia and all other reviewed systems are negative.  ALLERGIES: Allergies  Allergen Reactions   Codeine Itching and Nausea Only   Sulfa Antibiotics Nausea And Vomiting and Other (See Comments)    Flu-like symptoms   Serotonin Reuptake Inhibitors (Ssris) Other (See Comments)    Restlessness    HOME MEDICATIONS: Outpatient Medications Prior to Visit  Medication Sig Dispense Refill   amitriptyline  (ELAVIL ) 100 MG tablet Take 2 tablets (200 mg total) by mouth at bedtime. 60 tablet 2   atorvastatin  (LIPITOR) 40 MG tablet Take 1 tablet (40 mg total) by mouth daily. (Patient taking differently: Take 40 mg by mouth at bedtime.) 90 tablet 3   Lacosamide  (VIMPAT ) 150 MG TABS Take 1 tablet (150 mg total) by mouth 2 (two) times daily. (Patient taking differently: Take 1 tablet by mouth in the morning and at bedtime.) 180 tablet 1   metFORMIN  (GLUCOPHAGE -XR) 500 MG 24 hr tablet Take 2 tablets (1,000 mg total) by mouth daily. (Patient taking differently: Take 1,000 mg by mouth at bedtime.) 180 tablet 3   metoprolol  succinate (TOPROL -XL) 100 MG 24 hr tablet Take 1 tablet (100 mg total) by  mouth daily. Take with or immediately following a meal. (Patient taking differently: Take 50 mg by mouth at bedtime. Take with or immediately following a meal.) 90 tablet 3   predniSONE  (DELTASONE ) 20 MG tablet Take 2 tablets (40 mg total) by mouth daily with breakfast. For the next four days 8 tablet 0   QUEtiapine  (SEROQUEL ) 100 MG tablet Take 1 tablet (100 mg total) by mouth at bedtime. (Patient not taking: Reported on 07/18/2024) 30 tablet 2   Suvorexant  (BELSOMRA ) 10 MG TABS  Take 1 tablet (10 mg total) by mouth at bedtime. (Patient not taking: Reported on 07/18/2024) 30 tablet 2   Zoster Vaccine Adjuvanted (SHINGRIX ) injection Administer Shingrix  vaccination now and repeat in two months (Patient not taking: Reported on 07/18/2024) 1 each 1   No facility-administered medications prior to visit.     PAST MEDICAL HISTORY: Past Medical History:  Diagnosis Date   Anxiety    Bowel obstruction (HCC)    Chronic insomnia 04/14/2020   Colon polyps    Constipation    Depression    Diabetes mellitus without complication (HCC)    Dyslipidemia    GERD (gastroesophageal reflux disease)    Hearing loss    History of echocardiogram    Echo 8/18: EF 50-55, normal wall motion, grade 1 diastolic dysfunction, mild MR   Hypertension    Memory difficulty 10/25/2017   Memory loss    MRSA carrier    Pneumonia    Schizoaffective disorder    Seizures (HCC)    last 4-5 years ago per pt   Skin cancer      PAST SURGICAL HISTORY: Past Surgical History:  Procedure Laterality Date   CARPAL TUNNEL RELEASE     COLONOSCOPY  12/17/2008   COLONOSCOPY  07/23/2018   Dr.Jacobs   COLONOSCOPY WITH PROPOFOL  N/A 07/05/2022   Procedure: COLONOSCOPY WITH PROPOFOL ;  Surgeon: Teressa Toribio SQUIBB, MD;  Location: [facility];  Service: Gastroenterology;  Laterality: N/A;  Incomplete Colon   COLONOSCOPY WITH PROPOFOL  N/A 08/09/2022   Procedure: COLONOSCOPY WITH PROPOFOL ;  Surgeon: Mansouraty, Aloha Raddle., MD;  Location: [facility];  Service: Gastroenterology;  Laterality: N/A;   ORIF ANKLE FRACTURE Left 11/26/2018   Procedure: OPEN REDUCTION INTERNAL FIXATION (ORIF) ANKLE FRACTURE;  Surgeon: Cristy Bonner DASEN, MD;  Location: [facility];  Service: Orthopedics;  Laterality: Left;   ORIF SHOULDER FRACTURE Right    Arthroplasty   POLYPECTOMY  08/09/2022   Procedure: POLYPECTOMY;  Surgeon: Mansouraty, Aloha Raddle., MD;  Location: [facility];  Service: Gastroenterology;;   self orchectomy Bilateral     SHOULDER CLOSED REDUCTION Left 09/16/2013   Procedure: CLOSED REDUCTION SHOULDER;  Surgeon: Donnice JONETTA Car, MD;  Location: [facility];  Service: Orthopedics;  Laterality: Left;   SHOULDER HEMI-ARTHROPLASTY Left 09/18/2013   Procedure: LEFT SHOULDER HEMI-ARTHROPLASTY;  Surgeon: Elspeth JONELLE Her, MD;  Location[facility];  Service: Orthopedics;  Laterality: Left;   TIBIA IM NAIL INSERTION Left 11/26/2018   Procedure: INTRAMEDULLARY (IM) NAIL TIBIAL;  Surgeon: Cristy Bonner DASEN, MD;  Location: [facility];  Service: Orthopedics;  Laterality: Left;   UPPER GASTROINTESTINAL ENDOSCOPY  07/23/2018   Dr.Jacobs     FAMILY HISTORY: Family History  Problem Relation Age of Onset   Stroke Mother        Stroke in late 67's. Died in her 48's   Fibromyalgia Mother    Stroke Father        Living at 44   Alcohol abuse Brother    Alcohol abuse  Brother    Diabetes Brother    Colon cancer Neg Hx    Esophageal cancer Neg Hx    Stomach cancer Neg Hx    Rectal cancer Neg Hx      SOCIAL HISTORY: Social History   Socioeconomic History   Marital status: Single    Spouse name: Not on file   Number of children: 0   Years of education: 18   Highest education level: Not on file  Occupational History   Occupation: disabled  Tobacco Use   Smoking status: Never    Passive exposure: Never   Smokeless tobacco: Never  Vaping Use   Vaping status: Never Used  Substance and Sexual Activity   Alcohol use: No    Alcohol/week: 0.0 standard drinks of alcohol   Drug use: No   Sexual activity: Not Currently    Partners: Female    Birth control/protection: None  Other Topics Concern   Not on file  Social History Narrative   Patient does not drink caffeine.   Patient is left handed.   Social Drivers of Corporate investment banker Strain: Low Risk  (08/04/2018)   Overall Financial Resource Strain (CARDIA)    Difficulty of Paying Living Expenses: Not hard at all  Food Insecurity: Food Insecurity Present (01/14/2024)    Hunger Vital Sign    Worried About Running Out of Food in the Last Year: Sometimes true    Ran Out of Food in the Last Year: Sometimes true  Transportation Needs: Unmet Transportation Needs (01/14/2024)   PRAPARE - Administrator, Civil Service (Medical): Yes    Lack of Transportation (Non-Medical): No  Physical Activity: Inactive (08/04/2018)   Exercise Vital Sign    Days of Exercise per Week: 0 days    Minutes of Exercise per Session: 0 min  Stress: No Stress Concern Present (08/04/2018)   [employer] of Occupational Health - Occupational Stress Questionnaire    Feeling of Stress : Only a little  Social Connections: Unknown (08/04/2018)   Social Connection and Isolation Panel    Frequency of Communication with Friends and Family: More than three times a week    Frequency of Social Gatherings with Friends and Family: More than three times a week    Attends Religious Services: Never    Database administrator or Organizations: No    Attends Banker Meetings: Never    Marital Status: Not on file  Intimate Partner Violence: Not on file      PHYSICAL EXAM  There were no vitals filed for this visit.    There is no height or weight on file to calculate BMI.   Generalized: Well developed, in no acute distress  Cardiology: normal rate and rhythm, no murmur auscultated  Respiratory: clear to auscultation bilaterally    Neurological examination  Mentation: Alert oriented to time, place, history taking. Follows all commands speech and language fluent Cranial nerve II-XII: Pupils were equal round reactive to light. Extraocular movements were full, visual field were full on confrontational test. Facial sensation and strength were normal. Head turning and shoulder shrug  were normal and symmetric. Motor: The motor testing reveals 5 over 5 strength of all 4 extremities. Good symmetric motor tone is noted throughout.   Gait and station: Gait is normal.      DIAGNOSTIC DATA (LABS, IMAGING, TESTING) - I reviewed patient records, labs, notes, testing and imaging myself where available.  Lab Results  Component Value Date  WBC 7.8 07/18/2024   HGB 13.5 07/18/2024   HCT 40.2 07/18/2024   MCV 94.4 07/18/2024   PLT 188 07/18/2024      Component Value Date/Time   NA 137 07/18/2024 1419   NA 139 11/18/2023 1409   K 4.3 07/18/2024 1419   CL 103 07/18/2024 1419   CO2 24 07/18/2024 1419   GLUCOSE 127 (H) 07/18/2024 1419   BUN 21 07/18/2024 1419   BUN 18 11/18/2023 1409   CREATININE 0.95 07/18/2024 1419   CREATININE 0.87 02/18/2017 1413   CALCIUM  9.4 07/18/2024 1419   PROT 7.2 07/18/2024 1419   PROT 6.8 05/25/2021 1440   ALBUMIN  3.7 07/18/2024 1419   ALBUMIN  4.4 05/25/2021 1440   AST 33 07/18/2024 1419   ALT 49 (H) 07/18/2024 1419   ALKPHOS 81 07/18/2024 1419   BILITOT 0.6 07/18/2024 1419   BILITOT 0.3 05/25/2021 1440   GFRNONAA >60 07/18/2024 1419   GFRNONAA >89 02/18/2017 1413   GFRAA 93 03/17/2020 1409   GFRAA >89 02/18/2017 1413   Lab Results  Component Value Date   CHOL 86 (L) 11/18/2023   HDL 35 (L) 11/18/2023   LDLCALC 25 11/18/2023   TRIG 152 (H) 11/18/2023   CHOLHDL 2.5 11/18/2023   Lab Results  Component Value Date   HGBA1C 6.0 11/18/2023   Lab Results  Component Value Date   VITAMINB12 422 10/25/2017   Lab Results  Component Value Date   TSH 1.240 05/25/2021       10/07/2019   12:50 PM 05/15/2018    8:18 AM 10/25/2017   10:53 AM  MMSE - Mini Mental State Exam  Orientation to time 4 5 4    Orientation to Place 5 4 5    Registration 3 3 3    Attention/ Calculation 3 3 2    Recall 3 3 2    Language- name 2 objects 2 2 2    Language- repeat 1 1 1   Language- follow 3 step command 3 3 3    Language- read & follow direction 1 1 1    Write a sentence 1 1 1    Copy design 1 1 1    Copy design-comments named 10 animals    Total score 27 27 25       Data saved with a previous flowsheet row definition         02/08/2020    9:00 AM  Montreal Cognitive Assessment   Visuospatial/ Executive (0/5) 2  Naming (0/3) 3  Attention: Read list of digits (0/2) 2  Attention: Read list of letters (0/1) 1  Attention: Serial 7 subtraction starting at 100 (0/3) 2  Language: Repeat phrase (0/2) 2  Language : Fluency (0/1) 1  Abstraction (0/2) 1  Delayed Recall (0/5) 3  Orientation (0/6) 5  Total 22  Adjusted Score (based on education) 22     ASSESSMENT AND PLAN  67 y.o. year old male  has a past medical history of Anxiety, Bowel obstruction (HCC), Chronic insomnia (04/14/2020), Colon polyps, Constipation, Depression, Diabetes mellitus without complication (HCC), Dyslipidemia, GERD (gastroesophageal reflux disease), Hearing loss, History of echocardiogram, Hypertension, Memory difficulty (10/25/2017), Memory loss, MRSA carrier, Pneumonia, Schizoaffective disorder, Seizures (HCC), and Skin cancer. here with     No diagnosis found.  [name] reports doing well, today. No seizures. He is tolerating lacosamide . We will continue 150mg  twice daily. He is sleeping fairly well and feels mood is stable. He will continue close follow up with PCP and psychiatry. Consider counseling for insomnia. Continue labs with CPE. Healthy  lifestyle habits encouraged. He will follow up in 1 year, sooner if needed.   No orders of the defined types were placed in this encounter.    No orders of the defined types were placed in this encounter.    Greig Forbes, MSN, FNP-C 07/29/2024, 10:23 AM  [facility] [address] [place], [place] [address] [phone]

## 2024-07-30 ENCOUNTER — Telehealth: Payer: Self-pay | Admitting: Family Medicine

## 2024-07-30 NOTE — Telephone Encounter (Signed)
 Patient is going to call back to schedule a follow up with Dr. Donah and also a PFT with Dr. Koval.   Thanks!

## 2024-08-06 ENCOUNTER — Ambulatory Visit (HOSPITAL_COMMUNITY): Payer: Self-pay | Admitting: Psychiatry

## 2024-08-10 ENCOUNTER — Telehealth: Payer: Self-pay | Admitting: Family Medicine

## 2024-08-10 NOTE — Patient Instructions (Incomplete)
 Below is our plan:  We will continue lacosamide  150mg  twice daily.   Please make sure you are consistent with timing of seizure medication. I recommend annual visit with primary care provider (PCP) for complete physical and routine blood work. I recommend daily intake of vitamin D (400-800iu) and calcium  (800-1000mg ) for bone health. Discuss Dexa screening with PCP.   According to [place] law, you can not drive unless you are seizure / syncope free for at least 6 months and under physician's care.  Please maintain precautions. Do not participate in activities where a loss of awareness could harm you or someone else. No swimming alone, no tub bathing, no hot tubs, no driving, no operating motorized vehicles (cars, ATVs, motocycles, etc), lawnmowers, power tools or firearms. No standing at heights, such as rooftops, ladders or stairs. Avoid hot objects such as stoves, heaters, open fires. Wear a helmet when riding a bicycle, scooter, skateboard, etc. and avoid areas of traffic. Set your water heater to 120 degrees or less.  SUDEP is the sudden, unexpected death of someone with epilepsy, who was otherwise healthy. In SUDEP cases, no other cause of death is found when an autopsy is done. Each year, more than 1 in 1,000 people with epilepsy die from SUDEP. This is the leading cause of death in people with uncontrolled seizures. Until further answers are available, the best way to prevent SUDEP is to lower your risk by controlling seizures. Research has found that people with all types of epilepsy that experience convulsive seizures can be at risk.  Please make sure you are staying well hydrated. I recommend 50-60 ounces daily. Well balanced diet and regular exercise encouraged. Consistent sleep schedule with 6-8 hours recommended.   Please continue follow up with care team as directed.   Follow up with me in 1 year   You may receive a survey regarding today's visit. I encourage you to leave honest feed back  as I do use this information to improve patient care. Thank you for seeing me today!

## 2024-08-10 NOTE — Progress Notes (Deleted)
 No chief complaint on file.   HISTORY OF PRESENT ILLNESS:  08/10/24 ALL:  [name] returns for follow up for seizures. He was last seen 07/2023 and doing well. He continues lacosamide  150mg  BID.   07/30/2023 ALL:  [name] returns for follow up for seizures. He continues lacosamide  150mg  BID. Labs performed regularly with PCP. He is due for an appt. He continues to see psychiatry. Mood is good. He is sleeping pretty well. He has chronic insomnia despite psych/sleep aids. He was previously seeing a counselor but hasn't recently. He feels memory is stable. No trouble remembering to take meds. He lives with his girlfriend. He does not drive.   07/03/2022 ALL: [name] returns for follow up for seizures. He continues lacosamide  150mg  BID. He is doing well. Tolerating meds. No seizure activity. He reports mood is stable. He is sleeping well. He continues close follow up with PCP and psychiatry.   04/13/2021 ALL:  [name] is a 68 y.o. male here today for follow up for seizures. He continues Vimpat  and tolerating well. No recent seizure activity. He continues close follow up with psychiatry and PCP. He reports sleeping well. Memory stable. No new or worsening concerns, today.   HISTORY (copied from Dr Jenel' previous note)  [name] is a 68 year old left-handed white male with a history of schizoaffective disorder, and some memory issues.  He does have history of seizures but he has done quite well on Vimpat .  He has not had any further seizures.  He has a chronic issue with insomnia.  He has had neuropsychological testing that did not show an organic dementia, he was felt that he had executive function disorder that was in part related to depression, anxiety, and sleep problems.  The patient is also on multiple medications that are psychoactive including Vimpat , amitriptyline , Seroquel , and oxybutynin.  The patient claims that it takes about 2 hours for him to get to sleep even though he takes 200 mg of  amitriptyline , 5 mg of Ambien , and 400 mg of Seroquel  at night.  The patient returns for an evaluation.  He is currently not operating a motor vehicle but is planning on trying to get his driver's license back.  REVIEW OF SYSTEMS: Out of a complete 14 system review of symptoms, the patient complains only of the following symptoms,memory loss, insomnia and all other reviewed systems are negative.  ALLERGIES: Allergies  Allergen Reactions   Codeine Itching and Nausea Only   Sulfa Antibiotics Nausea And Vomiting and Other (See Comments)    Flu-like symptoms   Serotonin Reuptake Inhibitors (Ssris) Other (See Comments)    Restlessness    HOME MEDICATIONS: Outpatient Medications Prior to Visit  Medication Sig Dispense Refill   amitriptyline  (ELAVIL ) 100 MG tablet Take 2 tablets (200 mg total) by mouth at bedtime. 60 tablet 2   atorvastatin  (LIPITOR) 40 MG tablet Take 1 tablet (40 mg total) by mouth daily. (Patient taking differently: Take 40 mg by mouth at bedtime.) 90 tablet 3   Lacosamide  (VIMPAT ) 150 MG TABS Take 1 tablet (150 mg total) by mouth 2 (two) times daily. (Patient taking differently: Take 1 tablet by mouth in the morning and at bedtime.) 180 tablet 1   metFORMIN  (GLUCOPHAGE -XR) 500 MG 24 hr tablet Take 2 tablets (1,000 mg total) by mouth daily. (Patient taking differently: Take 1,000 mg by mouth at bedtime.) 180 tablet 3   metoprolol  succinate (TOPROL -XL) 100 MG 24 hr tablet Take 1 tablet (100 mg total) by  mouth daily. Take with or immediately following a meal. (Patient taking differently: Take 50 mg by mouth at bedtime. Take with or immediately following a meal.) 90 tablet 3   predniSONE  (DELTASONE ) 20 MG tablet Take 2 tablets (40 mg total) by mouth daily with breakfast. For the next four days 8 tablet 0   QUEtiapine  (SEROQUEL ) 100 MG tablet Take 1 tablet (100 mg total) by mouth at bedtime. (Patient not taking: Reported on 07/18/2024) 30 tablet 2   Suvorexant  (BELSOMRA ) 10 MG TABS  Take 1 tablet (10 mg total) by mouth at bedtime. (Patient not taking: Reported on 07/18/2024) 30 tablet 2   Zoster Vaccine Adjuvanted (SHINGRIX ) injection Administer Shingrix  vaccination now and repeat in two months (Patient not taking: Reported on 07/18/2024) 1 each 1   No facility-administered medications prior to visit.     PAST MEDICAL HISTORY: Past Medical History:  Diagnosis Date   Anxiety    Bowel obstruction (HCC)    Chronic insomnia 04/14/2020   Colon polyps    Constipation    Depression    Diabetes mellitus without complication (HCC)    Dyslipidemia    GERD (gastroesophageal reflux disease)    Hearing loss    History of echocardiogram    Echo 8/18: EF 50-55, normal wall motion, grade 1 diastolic dysfunction, mild MR   Hypertension    Memory difficulty 10/25/2017   Memory loss    MRSA carrier    Pneumonia    Schizoaffective disorder    Seizures (HCC)    last 4-5 years ago per pt   Skin cancer      PAST SURGICAL HISTORY: Past Surgical History:  Procedure Laterality Date   CARPAL TUNNEL RELEASE     COLONOSCOPY  12/17/2008   COLONOSCOPY  07/23/2018   Dr.Jacobs   COLONOSCOPY WITH PROPOFOL  N/A 07/05/2022   Procedure: COLONOSCOPY WITH PROPOFOL ;  Surgeon: Teressa Toribio SQUIBB, MD;  Location: [facility];  Service: Gastroenterology;  Laterality: N/A;  Incomplete Colon   COLONOSCOPY WITH PROPOFOL  N/A 08/09/2022   Procedure: COLONOSCOPY WITH PROPOFOL ;  Surgeon: Wilhelmenia Aloha Raddle., MD;  Location: [facility];  Service: Gastroenterology;  Laterality: N/A;   ORIF ANKLE FRACTURE Left 11/26/2018   Procedure: OPEN REDUCTION INTERNAL FIXATION (ORIF) ANKLE FRACTURE;  Surgeon: Cristy Bonner DASEN, MD;  Location: [facility];  Service: Orthopedics;  Laterality: Left;   ORIF SHOULDER FRACTURE Right    Arthroplasty   POLYPECTOMY  08/09/2022   Procedure: POLYPECTOMY;  Surgeon: Mansouraty, Aloha Raddle., MD;  Location: [facility];  Service: Gastroenterology;;   self orchectomy Bilateral     SHOULDER CLOSED REDUCTION Left 09/16/2013   Procedure: CLOSED REDUCTION SHOULDER;  Surgeon: Donnice JONETTA Car, MD;  Location: [facility];  Service: Orthopedics;  Laterality: Left;   SHOULDER HEMI-ARTHROPLASTY Left 09/18/2013   Procedure: LEFT SHOULDER HEMI-ARTHROPLASTY;  Surgeon: Elspeth JONELLE Her, MD;  Location: [facility];  Service: Orthopedics;  Laterality: Left;   TIBIA IM NAIL INSERTION Left 11/26/2018   Procedure: INTRAMEDULLARY (IM) NAIL TIBIAL;  Surgeon: Cristy Bonner DASEN, MD;  Location: [facility];  Service: Orthopedics;  Laterality: Left;   UPPER GASTROINTESTINAL ENDOSCOPY  07/23/2018   Dr.Jacobs     FAMILY HISTORY: Family History  Problem Relation Age of Onset   Stroke Mother        Stroke in late 36's. Died in her 42's   Fibromyalgia Mother    Stroke Father        Living at 100   Alcohol abuse Brother    Alcohol abuse  Brother    Diabetes Brother    Colon cancer Neg Hx    Esophageal cancer Neg Hx    Stomach cancer Neg Hx    Rectal cancer Neg Hx      SOCIAL HISTORY: Social History   Socioeconomic History   Marital status: Single    Spouse name: Not on file   Number of children: 0   Years of education: 18   Highest education level: Not on file  Occupational History   Occupation: disabled  Tobacco Use   Smoking status: Never    Passive exposure: Never   Smokeless tobacco: Never  Vaping Use   Vaping status: Never Used  Substance and Sexual Activity   Alcohol use: No    Alcohol/week: 0.0 standard drinks of alcohol   Drug use: No   Sexual activity: Not Currently    Partners: Female    Birth control/protection: None  Other Topics Concern   Not on file  Social History Narrative   Patient does not drink caffeine.   Patient is left handed.   Social Drivers of Corporate investment banker Strain: Low Risk  (08/04/2018)   Overall Financial Resource Strain (CARDIA)    Difficulty of Paying Living Expenses: Not hard at all  Food Insecurity: Food Insecurity Present (01/14/2024)    Hunger Vital Sign    Worried About Running Out of Food in the Last Year: Sometimes true    Ran Out of Food in the Last Year: Sometimes true  Transportation Needs: Unmet Transportation Needs (01/14/2024)   PRAPARE - Administrator, Civil Service (Medical): Yes    Lack of Transportation (Non-Medical): No  Physical Activity: Inactive (08/04/2018)   Exercise Vital Sign    Days of Exercise per Week: 0 days    Minutes of Exercise per Session: 0 min  Stress: No Stress Concern Present (08/04/2018)   [employer] of Occupational Health - Occupational Stress Questionnaire    Feeling of Stress : Only a little  Social Connections: Unknown (08/04/2018)   Social Connection and Isolation Panel    Frequency of Communication with Friends and Family: More than three times a week    Frequency of Social Gatherings with Friends and Family: More than three times a week    Attends Religious Services: Never    Database administrator or Organizations: No    Attends Banker Meetings: Never    Marital Status: Not on file  Intimate Partner Violence: Not on file      PHYSICAL EXAM  There were no vitals filed for this visit.    There is no height or weight on file to calculate BMI.   Generalized: Well developed, in no acute distress  Cardiology: normal rate and rhythm, no murmur auscultated  Respiratory: clear to auscultation bilaterally    Neurological examination  Mentation: Alert oriented to time, place, history taking. Follows all commands speech and language fluent Cranial nerve II-XII: Pupils were equal round reactive to light. Extraocular movements were full, visual field were full on confrontational test. Facial sensation and strength were normal. Head turning and shoulder shrug  were normal and symmetric. Motor: The motor testing reveals 5 over 5 strength of all 4 extremities. Good symmetric motor tone is noted throughout.   Gait and station: Gait is normal.      DIAGNOSTIC DATA (LABS, IMAGING, TESTING) - I reviewed patient records, labs, notes, testing and imaging myself where available.  Lab Results  Component Value Date  WBC 7.8 07/18/2024   HGB 13.5 07/18/2024   HCT 40.2 07/18/2024   MCV 94.4 07/18/2024   PLT 188 07/18/2024      Component Value Date/Time   NA 137 07/18/2024 1419   NA 139 11/18/2023 1409   K 4.3 07/18/2024 1419   CL 103 07/18/2024 1419   CO2 24 07/18/2024 1419   GLUCOSE 127 (H) 07/18/2024 1419   BUN 21 07/18/2024 1419   BUN 18 11/18/2023 1409   CREATININE 0.95 07/18/2024 1419   CREATININE 0.87 02/18/2017 1413   CALCIUM  9.4 07/18/2024 1419   PROT 7.2 07/18/2024 1419   PROT 6.8 05/25/2021 1440   ALBUMIN  3.7 07/18/2024 1419   ALBUMIN  4.4 05/25/2021 1440   AST 33 07/18/2024 1419   ALT 49 (H) 07/18/2024 1419   ALKPHOS 81 07/18/2024 1419   BILITOT 0.6 07/18/2024 1419   BILITOT 0.3 05/25/2021 1440   GFRNONAA >60 07/18/2024 1419   GFRNONAA >89 02/18/2017 1413   GFRAA 93 03/17/2020 1409   GFRAA >89 02/18/2017 1413   Lab Results  Component Value Date   CHOL 86 (L) 11/18/2023   HDL 35 (L) 11/18/2023   LDLCALC 25 11/18/2023   TRIG 152 (H) 11/18/2023   CHOLHDL 2.5 11/18/2023   Lab Results  Component Value Date   HGBA1C 6.0 11/18/2023   Lab Results  Component Value Date   VITAMINB12 422 10/25/2017   Lab Results  Component Value Date   TSH 1.240 05/25/2021       10/07/2019   12:50 PM 05/15/2018    8:18 AM 10/25/2017   10:53 AM  MMSE - Mini Mental State Exam  Orientation to time 4 5 4    Orientation to Place 5 4 5    Registration 3 3 3    Attention/ Calculation 3 3 2    Recall 3 3 2    Language- name 2 objects 2 2 2    Language- repeat 1 1 1   Language- follow 3 step command 3 3 3    Language- read & follow direction 1 1 1    Write a sentence 1 1 1    Copy design 1 1 1    Copy design-comments named 10 animals    Total score 27 27 25       Data saved with a previous flowsheet row definition         02/08/2020    9:00 AM  Montreal Cognitive Assessment   Visuospatial/ Executive (0/5) 2  Naming (0/3) 3  Attention: Read list of digits (0/2) 2  Attention: Read list of letters (0/1) 1  Attention: Serial 7 subtraction starting at 100 (0/3) 2  Language: Repeat phrase (0/2) 2  Language : Fluency (0/1) 1  Abstraction (0/2) 1  Delayed Recall (0/5) 3  Orientation (0/6) 5  Total 22  Adjusted Score (based on education) 22     ASSESSMENT AND PLAN  68 y.o. year old male  has a past medical history of Anxiety, Bowel obstruction (HCC), Chronic insomnia (04/14/2020), Colon polyps, Constipation, Depression, Diabetes mellitus without complication (HCC), Dyslipidemia, GERD (gastroesophageal reflux disease), Hearing loss, History of echocardiogram, Hypertension, Memory difficulty (10/25/2017), Memory loss, MRSA carrier, Pneumonia, Schizoaffective disorder, Seizures (HCC), and Skin cancer. here with     No diagnosis found.   Levar reports doing well, today. No seizures. He is tolerating lacosamide . We will continue 150mg  twice daily. He is sleeping fairly well and feels mood is stable. He will continue close follow up with PCP and psychiatry. Consider counseling for insomnia. Continue labs with CPE.  Reviewed previous results in Epic. Healthy lifestyle habits encouraged. He will follow up in 1 year, sooner if needed.    No orders of the defined types were placed in this encounter.    No orders of the defined types were placed in this encounter.    Greig Forbes, MSN, FNP-C 08/10/2024, 3:22 PM  [facility] [address], [address] [place], [place] [address] [phone]

## 2024-08-10 NOTE — Telephone Encounter (Signed)
R/s appointment

## 2024-08-11 ENCOUNTER — Encounter (HOSPITAL_COMMUNITY): Payer: Self-pay | Admitting: Internal Medicine

## 2024-08-11 ENCOUNTER — Emergency Department (HOSPITAL_COMMUNITY): Payer: MEDICAID

## 2024-08-11 ENCOUNTER — Other Ambulatory Visit: Payer: Self-pay

## 2024-08-11 ENCOUNTER — Inpatient Hospital Stay (HOSPITAL_COMMUNITY)
Admission: EM | Admit: 2024-08-11 | Discharge: 2024-08-31 | DRG: 492 | Disposition: A | Discharge status: Skilled Nursing Facility | Payer: MEDICAID | Attending: Internal Medicine | Admitting: Internal Medicine

## 2024-08-11 ENCOUNTER — Ambulatory Visit: Admitting: Family Medicine

## 2024-08-11 ENCOUNTER — Inpatient Hospital Stay (HOSPITAL_COMMUNITY): Payer: MEDICAID

## 2024-08-11 DIAGNOSIS — Z9181 History of falling: Secondary | ICD-10-CM

## 2024-08-11 DIAGNOSIS — E119 Type 2 diabetes mellitus without complications: Secondary | ICD-10-CM | POA: Diagnosis present

## 2024-08-11 DIAGNOSIS — Z85828 Personal history of other malignant neoplasm of skin: Secondary | ICD-10-CM

## 2024-08-11 DIAGNOSIS — D649 Anemia, unspecified: Secondary | ICD-10-CM | POA: Diagnosis present

## 2024-08-11 DIAGNOSIS — E861 Hypovolemia: Secondary | ICD-10-CM | POA: Diagnosis present

## 2024-08-11 DIAGNOSIS — S82891A Other fracture of right lower leg, initial encounter for closed fracture: Secondary | ICD-10-CM | POA: Diagnosis present

## 2024-08-11 DIAGNOSIS — Z5971 Insufficient health insurance coverage: Secondary | ICD-10-CM

## 2024-08-11 DIAGNOSIS — Z860100 Personal history of colon polyps, unspecified: Secondary | ICD-10-CM

## 2024-08-11 DIAGNOSIS — Z885 Allergy status to narcotic agent status: Secondary | ICD-10-CM

## 2024-08-11 DIAGNOSIS — H9190 Unspecified hearing loss, unspecified ear: Secondary | ICD-10-CM | POA: Diagnosis present

## 2024-08-11 DIAGNOSIS — I519 Heart disease, unspecified: Secondary | ICD-10-CM | POA: Diagnosis present

## 2024-08-11 DIAGNOSIS — Z888 Allergy status to other drugs, medicaments and biological substances status: Secondary | ICD-10-CM

## 2024-08-11 DIAGNOSIS — R042 Hemoptysis: Secondary | ICD-10-CM | POA: Diagnosis not present

## 2024-08-11 DIAGNOSIS — Z7984 Long term (current) use of oral hypoglycemic drugs: Secondary | ICD-10-CM

## 2024-08-11 DIAGNOSIS — Z602 Problems related to living alone: Secondary | ICD-10-CM | POA: Diagnosis present

## 2024-08-11 DIAGNOSIS — K219 Gastro-esophageal reflux disease without esophagitis: Secondary | ICD-10-CM | POA: Diagnosis present

## 2024-08-11 DIAGNOSIS — R102 Pelvic and perineal pain: Secondary | ICD-10-CM | POA: Diagnosis not present

## 2024-08-11 DIAGNOSIS — E785 Hyperlipidemia, unspecified: Secondary | ICD-10-CM | POA: Diagnosis present

## 2024-08-11 DIAGNOSIS — Z811 Family history of alcohol abuse and dependence: Secondary | ICD-10-CM

## 2024-08-11 DIAGNOSIS — G40909 Epilepsy, unspecified, not intractable, without status epilepticus: Secondary | ICD-10-CM

## 2024-08-11 DIAGNOSIS — Z833 Family history of diabetes mellitus: Secondary | ICD-10-CM

## 2024-08-11 DIAGNOSIS — N3289 Other specified disorders of bladder: Secondary | ICD-10-CM | POA: Diagnosis not present

## 2024-08-11 DIAGNOSIS — S82401A Unspecified fracture of shaft of right fibula, initial encounter for closed fracture: Secondary | ICD-10-CM

## 2024-08-11 DIAGNOSIS — Z882 Allergy status to sulfonamides status: Secondary | ICD-10-CM

## 2024-08-11 DIAGNOSIS — Z8719 Personal history of other diseases of the digestive system: Secondary | ICD-10-CM

## 2024-08-11 DIAGNOSIS — E66811 Obesity, class 1: Secondary | ICD-10-CM | POA: Diagnosis present

## 2024-08-11 DIAGNOSIS — Z8701 Personal history of pneumonia (recurrent): Secondary | ICD-10-CM

## 2024-08-11 DIAGNOSIS — J9601 Acute respiratory failure with hypoxia: Secondary | ICD-10-CM | POA: Diagnosis not present

## 2024-08-11 DIAGNOSIS — I5189 Other ill-defined heart diseases: Secondary | ICD-10-CM

## 2024-08-11 DIAGNOSIS — K5909 Other constipation: Secondary | ICD-10-CM | POA: Diagnosis present

## 2024-08-11 DIAGNOSIS — S82851A Displaced trimalleolar fracture of right lower leg, initial encounter for closed fracture: Principal | ICD-10-CM | POA: Diagnosis present

## 2024-08-11 DIAGNOSIS — Z6832 Body mass index (BMI) 32.0-32.9, adult: Secondary | ICD-10-CM

## 2024-08-11 DIAGNOSIS — Z22322 Carrier or suspected carrier of Methicillin resistant Staphylococcus aureus: Secondary | ICD-10-CM

## 2024-08-11 DIAGNOSIS — W19XXXA Unspecified fall, initial encounter: Secondary | ICD-10-CM | POA: Diagnosis present

## 2024-08-11 DIAGNOSIS — E871 Hypo-osmolality and hyponatremia: Secondary | ICD-10-CM | POA: Diagnosis present

## 2024-08-11 DIAGNOSIS — J690 Pneumonitis due to inhalation of food and vomit: Secondary | ICD-10-CM | POA: Diagnosis not present

## 2024-08-11 DIAGNOSIS — R338 Other retention of urine: Secondary | ICD-10-CM

## 2024-08-11 DIAGNOSIS — Y92009 Unspecified place in unspecified non-institutional (private) residence as the place of occurrence of the external cause: Secondary | ICD-10-CM

## 2024-08-11 DIAGNOSIS — Z823 Family history of stroke: Secondary | ICD-10-CM

## 2024-08-11 DIAGNOSIS — I10 Essential (primary) hypertension: Secondary | ICD-10-CM | POA: Diagnosis present

## 2024-08-11 DIAGNOSIS — Z79899 Other long term (current) drug therapy: Secondary | ICD-10-CM

## 2024-08-11 DIAGNOSIS — G4733 Obstructive sleep apnea (adult) (pediatric): Secondary | ICD-10-CM | POA: Diagnosis present

## 2024-08-11 DIAGNOSIS — R339 Retention of urine, unspecified: Secondary | ICD-10-CM | POA: Diagnosis not present

## 2024-08-11 DIAGNOSIS — S93439A Sprain of tibiofibular ligament of unspecified ankle, initial encounter: Secondary | ICD-10-CM | POA: Diagnosis present

## 2024-08-11 DIAGNOSIS — G4730 Sleep apnea, unspecified: Secondary | ICD-10-CM | POA: Diagnosis present

## 2024-08-11 DIAGNOSIS — S8251XA Displaced fracture of medial malleolus of right tibia, initial encounter for closed fracture: Principal | ICD-10-CM

## 2024-08-11 DIAGNOSIS — F259 Schizoaffective disorder, unspecified: Secondary | ICD-10-CM | POA: Diagnosis present

## 2024-08-11 LAB — CBC WITH DIFFERENTIAL/PLATELET
Abs Immature Granulocytes: 0.04 K/uL (ref 0.00–0.07)
Basophils Absolute: 0.1 K/uL (ref 0.0–0.1)
Basophils Relative: 1 %
Eosinophils Absolute: 0.2 K/uL (ref 0.0–0.5)
Eosinophils Relative: 2 %
HCT: 40 % (ref 39.0–52.0)
Hemoglobin: 13.3 g/dL (ref 13.0–17.0)
Immature Granulocytes: 0 %
Lymphocytes Relative: 13 %
Lymphs Abs: 1.3 K/uL (ref 0.7–4.0)
MCH: 31.2 pg (ref 26.0–34.0)
MCHC: 33.3 g/dL (ref 30.0–36.0)
MCV: 93.9 fL (ref 80.0–100.0)
Monocytes Absolute: 0.6 K/uL (ref 0.1–1.0)
Monocytes Relative: 6 %
Neutro Abs: 7.6 K/uL (ref 1.7–7.7)
Neutrophils Relative %: 78 %
Platelets: 194 K/uL (ref 150–400)
RBC: 4.26 MIL/uL (ref 4.22–5.81)
RDW: 13.3 % (ref 11.5–15.5)
WBC: 9.7 K/uL (ref 4.0–10.5)
nRBC: 0 % (ref 0.0–0.2)

## 2024-08-11 LAB — BASIC METABOLIC PANEL WITH GFR
Anion gap: 10 (ref 5–15)
BUN: 24 mg/dL — ABNORMAL HIGH (ref 8–23)
CO2: 21 mmol/L — ABNORMAL LOW (ref 22–32)
Calcium: 8.9 mg/dL (ref 8.9–10.3)
Chloride: 102 mmol/L (ref 98–111)
Creatinine, Ser: 1.06 mg/dL (ref 0.61–1.24)
GFR, Estimated: >60 mL/min
Glucose, Bld: 136 mg/dL — ABNORMAL HIGH (ref 70–99)
Potassium: 4.3 mmol/L (ref 3.5–5.1)
Sodium: 133 mmol/L — ABNORMAL LOW (ref 135–145)

## 2024-08-11 LAB — GLUCOSE, CAPILLARY
Glucose-Capillary: 183 mg/dL — ABNORMAL HIGH (ref 70–99)
Glucose-Capillary: 119 mg/dL — ABNORMAL HIGH (ref 70–99)

## 2024-08-11 LAB — CBG MONITORING, ED: Glucose-Capillary: 132 mg/dL — ABNORMAL HIGH (ref 70–99)

## 2024-08-11 MED ORDER — AMITRIPTYLINE HCL 50 MG PO TABS
200.0000 mg | ORAL_TABLET | Freq: Every day | ORAL | Status: DC
  Administered 2024-08-11 – 2024-08-30 (×20): 200 mg via ORAL
  Filled 2024-08-11 (×2): qty 8
  Filled 2024-08-11 (×16): qty 4
  Filled 2024-08-11: qty 8
  Filled 2024-08-11: qty 4

## 2024-08-11 MED ORDER — ONDANSETRON HCL 4 MG PO TABS: 4.0000 mg | ORAL_TABLET | Freq: Four times a day (QID) | ORAL | Status: DC | PRN

## 2024-08-11 MED ORDER — METFORMIN HCL ER 500 MG PO TB24
1000.0000 mg | ORAL_TABLET | Freq: Every day | ORAL | Status: DC
Start: 2024-08-12 — End: 2024-08-31
  Administered 2024-08-12 – 2024-08-31 (×20): 1000 mg via ORAL
  Filled 2024-08-11 (×20): qty 2

## 2024-08-11 MED ORDER — FENTANYL CITRATE PF 50 MCG/ML IJ SOSY
50.0000 ug | PREFILLED_SYRINGE | Freq: Once | INTRAMUSCULAR | Status: AC
  Administered 2024-08-11: 50 ug via INTRAVENOUS
  Filled 2024-08-11: qty 1

## 2024-08-11 MED ORDER — SENNOSIDES-DOCUSATE SODIUM 8.6-50 MG PO TABS
1.0000 | ORAL_TABLET | Freq: Two times a day (BID) | ORAL | Status: DC
  Administered 2024-08-11 – 2024-08-16 (×11): 1 via ORAL
  Filled 2024-08-11 (×11): qty 1

## 2024-08-11 MED ORDER — ONDANSETRON HCL 4 MG/2ML IJ SOLN: 4.0000 mg | Freq: Four times a day (QID) | INTRAMUSCULAR | Status: DC | PRN

## 2024-08-11 MED ORDER — ACETAMINOPHEN 650 MG RE SUPP: 650.0000 mg | Freq: Four times a day (QID) | RECTAL | Status: DC | PRN

## 2024-08-11 MED ORDER — ACETAMINOPHEN 325 MG PO TABS
650.0000 mg | ORAL_TABLET | Freq: Four times a day (QID) | ORAL | Status: DC | PRN
  Administered 2024-08-14 – 2024-08-15 (×4): 650 mg via ORAL
  Filled 2024-08-11 (×4): qty 2

## 2024-08-11 MED ORDER — ATORVASTATIN CALCIUM 40 MG PO TABS
40.0000 mg | ORAL_TABLET | Freq: Every day | ORAL | Status: DC
  Administered 2024-08-11 – 2024-08-30 (×20): 40 mg via ORAL
  Filled 2024-08-11 (×20): qty 1

## 2024-08-11 MED ORDER — INSULIN ASPART 100 UNIT/ML IJ SOLN
0.0000 [IU] | Freq: Three times a day (TID) | INTRAMUSCULAR | Status: DC
  Administered 2024-08-11: 3 [IU] via SUBCUTANEOUS
  Administered 2024-08-12: 2 [IU] via SUBCUTANEOUS
  Administered 2024-08-12: 3 [IU] via SUBCUTANEOUS
  Administered 2024-08-13: 2 [IU] via SUBCUTANEOUS
  Administered 2024-08-13: 5 [IU] via SUBCUTANEOUS
  Administered 2024-08-14: 2 [IU] via SUBCUTANEOUS
  Administered 2024-08-14: 3 [IU] via SUBCUTANEOUS
  Administered 2024-08-14: 2 [IU] via SUBCUTANEOUS
  Administered 2024-08-15: 3 [IU] via SUBCUTANEOUS
  Administered 2024-08-15 – 2024-08-16 (×3): 2 [IU] via SUBCUTANEOUS
  Administered 2024-08-16: 3 [IU] via SUBCUTANEOUS
  Administered 2024-08-17: 2 [IU] via SUBCUTANEOUS
  Administered 2024-08-17: 3 [IU] via SUBCUTANEOUS
  Administered 2024-08-17 – 2024-08-18 (×2): 2 [IU] via SUBCUTANEOUS
  Administered 2024-08-18 (×2): 3 [IU] via SUBCUTANEOUS
  Administered 2024-08-19 – 2024-08-20 (×3): 2 [IU] via SUBCUTANEOUS
  Administered 2024-08-20 – 2024-08-21 (×3): 3 [IU] via SUBCUTANEOUS
  Administered 2024-08-21 (×2): 2 [IU] via SUBCUTANEOUS
  Administered 2024-08-22: 3 [IU] via SUBCUTANEOUS
  Administered 2024-08-22 (×2): 2 [IU] via SUBCUTANEOUS
  Administered 2024-08-23: 3 [IU] via SUBCUTANEOUS
  Administered 2024-08-24: 2 [IU] via SUBCUTANEOUS
  Administered 2024-08-24: 3 [IU] via SUBCUTANEOUS
  Administered 2024-08-24: 2 [IU] via SUBCUTANEOUS
  Administered 2024-08-25: 3 [IU] via SUBCUTANEOUS
  Administered 2024-08-26: 2 [IU] via SUBCUTANEOUS
  Administered 2024-08-26: 3 [IU] via SUBCUTANEOUS
  Administered 2024-08-27 – 2024-08-29 (×5): 2 [IU] via SUBCUTANEOUS
  Administered 2024-08-30: 3 [IU] via SUBCUTANEOUS
  Administered 2024-08-30 – 2024-08-31 (×3): 2 [IU] via SUBCUTANEOUS

## 2024-08-11 MED ORDER — METOPROLOL SUCCINATE ER 25 MG PO TB24
100.0000 mg | ORAL_TABLET | Freq: Every day | ORAL | Status: DC
  Administered 2024-08-11 – 2024-08-13 (×3): 100 mg via ORAL
  Filled 2024-08-11 (×3): qty 2

## 2024-08-11 MED ORDER — TRAZODONE HCL 50 MG PO TABS
25.0000 mg | ORAL_TABLET | Freq: Once | ORAL | Status: AC
  Administered 2024-08-11: 25 mg via ORAL
  Filled 2024-08-11: qty 1

## 2024-08-11 MED ORDER — OXYCODONE HCL 5 MG PO TABS
5.0000 mg | ORAL_TABLET | ORAL | Status: DC | PRN
  Administered 2024-08-11 – 2024-08-21 (×10): 5 mg via ORAL
  Filled 2024-08-11 (×12): qty 1

## 2024-08-11 MED ORDER — LACOSAMIDE 50 MG PO TABS
150.0000 mg | ORAL_TABLET | Freq: Two times a day (BID) | ORAL | Status: DC
  Administered 2024-08-11 – 2024-08-31 (×41): 150 mg via ORAL
  Filled 2024-08-11 (×41): qty 3

## 2024-08-11 MED ORDER — KETOROLAC TROMETHAMINE 15 MG/ML IJ SOLN
15.0000 mg | Freq: Once | INTRAMUSCULAR | Status: AC
  Administered 2024-08-11: 15 mg via INTRAVENOUS
  Filled 2024-08-11: qty 1

## 2024-08-11 MED ORDER — POLYETHYLENE GLYCOL 3350 17 G PO PACK
17.0000 g | PACK | Freq: Every day | ORAL | Status: DC
  Administered 2024-08-11 – 2024-08-16 (×6): 17 g via ORAL
  Filled 2024-08-11 (×6): qty 1

## 2024-08-11 NOTE — Progress Notes (Signed)
 Of note, I was provided an update by Dr. Romelle. He states that the patient is not able to tolerate crutches, and that the patient does not feel able to safely care for himself at home.  The patient will be admitted to the medicine service.  I will ensure that Dr. Elsa is aware of this admission, as he may proceed with surgery during his current hospital stay.

## 2024-08-11 NOTE — Progress Notes (Signed)
   08/11/24 2131  BiPAP/CPAP/SIPAP  Reason BIPAP/CPAP not in use Non-compliant   Patient on room air in no distress during encounter, He states he sleeps fine and has no diagnosis of Osa nor used cpap in the place. No machine was left at bedside as patient does not intend on using.

## 2024-08-11 NOTE — ED Triage Notes (Addendum)
 Pt arrives via EMS. PT reports that he stood up too fast, felt dizzy, and felt down. PT denies syncope, and recalls events. Pt has a RIGHT ankle deformity that was splinted by EMS. DP pulse present and marked per EMS.   EMS administered 50mcg of Fentanyl .   Concern for recent confusion?

## 2024-08-11 NOTE — Discharge Instructions (Addendum)
 [facility] POST-OP INSTRUCTIONS   Pain Management The numbing medicine and your leg will last around 18 hours, take a dose of your pain medicine as soon as you feel it wearing off to avoid rebound pain. Keep your foot elevated above heart level.  Make sure that your heel hangs free ('floats'). Take all prescribed medication as directed. If taking narcotic pain medication you may want to use an over-the-counter stool softener to avoid constipation. You may take over-the-counter NSAIDs (ibuprofen, naproxen, etc.) as well as over-the-counter acetaminophen  as directed on the packaging as a supplement for your pain and may also use it to wean away from the prescription medication.  Activity Non-weightbearing Keep splint intact  First Postoperative Visit Your first postop visit will be at least 2 weeks after surgery.  This should be scheduled when you schedule surgery. If you do not have a postoperative visit scheduled please call [phone] to schedule an appointment. At the appointment your incision will be evaluated for suture removal, x-rays will be obtained if necessary.  General Instructions Swelling is very common after foot and ankle surgery.  It often takes 3 months for the foot and ankle to begin to feel comfortable.  Some amount of swelling will persist for 6-12 months. DO NOT change the dressing.  If there is a problem with the dressing (too tight, loose, gets wet, etc.) please contact Dr. Isiah office. DO NOT get the dressing wet.  For showers you can use an over-the-counter cast cover or wrap a washcloth around the top of your dressing and then cover it with a plastic bag and tape it to your leg. DO NOT soak the incision (no tubs, pools, bath, etc.) until you have approval from Dr. ELSA.  Contact Dr. Nadara office or go to Emergency Room if: Temperature above 101 F. Increasing pain that is unresponsive to pain medication or elevation Excessive redness or  swelling in your foot Dressing problems - excessive bloody drainage, looseness or tightness, or if dressing gets wet Develop pain, swelling, warmth, or discoloration of your calf

## 2024-08-11 NOTE — Progress Notes (Signed)
 I was notified regarding patient's right ankle fracture.  I have reviewed his x-rays.  I discussed his situation with our foot and ankle specialist, Dr. Medford Pae.  His recommendation is for the patient to be immobilized, elevated, and to follow up in the office.  The patient will need surgery electively.  These details will be discussed between the patient and Dr. Pae in the office. I did speak with Dr. Romelle, highlighting this treatment plan.

## 2024-08-11 NOTE — H&P (Signed)
 History and Physical    Patient: [name] FMW:[id number] DOB: 1956-11-05 DOA: 08/11/2024 DOS: the patient was seen and examined on 08/11/2024 PCP: [name], MD  Patient coming from: Home  Chief Complaint:  Chief Complaint  Patient presents with   Fall   Ankle Pain   HPI: [name] is a 68 y.o. male with medical history significant of anxiety, depression, schizoaffective disorder, bowel obstruction, chronic anemia, colon polyps, constipation, GERD, type 2 diabetes, hyperlipidemia, hypertension, memory loss, MRSA carrier, history of pneumonia, seizure disorder, skin cancer who was brought to the emergency department after he had a fall.  Per patient, he stood up too fast, felt dizzy and then fell down.  He hit his head, but stated not very hard.  He did not lose consciousness.  No nausea or emesis.  However, he twisted his right ankle and was only able to get up and bear weight on it. He denied fever, chills, rhinorrhea, sore throat, wheezing or hemoptysis.  No chest pain, palpitations, diaphoresis, PND, orthopnea or pitting edema of the lower extremities.  No abdominal pain, nausea, emesis, diarrhea, constipation, melena or hematochezia.  No flank pain, dysuria, frequency or hematuria.  No polyuria, polydipsia, polyphagia or blurred vision.   ED course: Initial vital signs were temperature 97.6 F, pulse 72, respiration 20, BP 121/77 mmHg O2 sat 94% on room air.  The patient received fentanyl  50 mcg IVP x 1 and I added another 15 mcg of fentanyl  plus Toradol  15 mg IVP x 1 while in the emergency department  Lab work: CBC was normal.  BMP shows sodium 133, potassium 4.3, chloride 102 and CO2 21 mmol/L with a normal anion gap.  Glucose on 136, BUN 24, creatinine 1.06 and calcium  8.9 mg/dL.  Imaging: Right ankle x-ray showed mildly displaced fracture of the lower fourth diaphysis of the right fibula.  Displaced fracture of the medial malleolus with resultant disruption of ankle  mortise.  Portable 1 view chest radiograph showing normal heart size and mediastinal contours.  Clear lungs.   Review of Systems: As mentioned in the history of present illness. All other systems reviewed and are negative. Past Medical History:  Diagnosis Date   Anxiety    Bowel obstruction (HCC)    Chronic insomnia 04/14/2020   Colon polyps    Constipation    Depression    Diabetes mellitus without complication (HCC)    Dyslipidemia    GERD (gastroesophageal reflux disease)    Hearing loss    History of echocardiogram    Echo 8/18: EF 50-55, normal wall motion, grade 1 diastolic dysfunction, mild MR   Hypertension    Memory difficulty 10/25/2017   Memory loss    MRSA carrier    Pneumonia    Schizoaffective disorder    Seizures (HCC)    last 4-5 years ago per pt   Skin cancer    Past Surgical History:  Procedure Laterality Date   CARPAL TUNNEL RELEASE     COLONOSCOPY  12/17/2008   COLONOSCOPY  07/23/2018   Dr.Jacobs   COLONOSCOPY WITH PROPOFOL  N/A 07/05/2022   Procedure: COLONOSCOPY WITH PROPOFOL ;  Surgeon: Teressa Toribio SQUIBB, MD;  Location: [facility];  Service: Gastroenterology;  Laterality: N/A;  Incomplete Colon   COLONOSCOPY WITH PROPOFOL  N/A 08/09/2022   Procedure: COLONOSCOPY WITH PROPOFOL ;  Surgeon: Wilhelmenia Aloha Raddle., MD;  Location: [facility];  Service: Gastroenterology;  Laterality: N/A;   ORIF ANKLE FRACTURE Left 11/26/2018   Procedure: OPEN REDUCTION INTERNAL FIXATION (  ORIF) ANKLE FRACTURE;  Surgeon: Cristy Bonner DASEN, MD;  Location: [facility] OR;  Service: Orthopedics;  Laterality: Left;   ORIF SHOULDER FRACTURE Right    Arthroplasty   POLYPECTOMY  08/09/2022   Procedure: POLYPECTOMY;  Surgeon: Mansouraty, Aloha Raddle., MD;  Location: [facility];  Service: Gastroenterology;;   self orchectomy Bilateral    SHOULDER CLOSED REDUCTION Left 09/16/2013   Procedure: CLOSED REDUCTION SHOULDER;  Surgeon: Donnice JONETTA Car, MD;  Location: [facility];  Service: Orthopedics;   Laterality: Left;   SHOULDER HEMI-ARTHROPLASTY Left 09/18/2013   Procedure: LEFT SHOULDER HEMI-ARTHROPLASTY;  Surgeon: Elspeth JONELLE Her, MD;  Location: [facility] OR;  Service: Orthopedics;  Laterality: Left;   TIBIA IM NAIL INSERTION Left 11/26/2018   Procedure: INTRAMEDULLARY (IM) NAIL TIBIAL;  Surgeon: Cristy Bonner DASEN, MD;  Location: [facility] OR;  Service: Orthopedics;  Laterality: Left;   UPPER GASTROINTESTINAL ENDOSCOPY  07/23/2018   Dr.Jacobs   Social History:  reports that he has never smoked. He has never been exposed to tobacco smoke. He has never used smokeless tobacco. He reports that he does not drink alcohol and does not use drugs.  Allergies  Allergen Reactions   Codeine Itching and Nausea Only   Sulfa Antibiotics Nausea And Vomiting and Other (See Comments)    Flu-like symptoms   Serotonin Reuptake Inhibitors (Ssris) Other (See Comments)    Restlessness    Family History  Problem Relation Age of Onset   Stroke Mother        Stroke in late 34's. Died in her 17's   Fibromyalgia Mother    Stroke Father        Living at 78   Alcohol abuse Brother    Alcohol abuse Brother    Diabetes Brother    Colon cancer Neg Hx    Esophageal cancer Neg Hx    Stomach cancer Neg Hx    Rectal cancer Neg Hx     Prior to Admission medications   Medication Sig Start Date End Date Taking? Authorizing Provider  amitriptyline  (ELAVIL ) 100 MG tablet Take 2 tablets (200 mg total) by mouth at bedtime. 11/21/23   Arfeen, Leni DASEN, MD  atorvastatin  (LIPITOR) 40 MG tablet Take 1 tablet (40 mg total) by mouth daily. Patient taking differently: Take 40 mg by mouth at bedtime. 11/18/23   [name], MD  Lacosamide  (VIMPAT ) 150 MG TABS Take 1 tablet (150 mg total) by mouth 2 (two) times daily. Patient taking differently: Take 1 tablet by mouth in the morning and at bedtime. 03/05/24   Lomax, Amy, NP  metFORMIN  (GLUCOPHAGE -XR) 500 MG 24 hr tablet Take 2 tablets (1,000 mg total) by mouth daily. Patient  taking differently: Take 1,000 mg by mouth at bedtime. 11/18/23   [name], MD  metoprolol  succinate (TOPROL -XL) 100 MG 24 hr tablet Take 1 tablet (100 mg total) by mouth daily. Take with or immediately following a meal. Patient taking differently: Take 50 mg by mouth at bedtime. Take with or immediately following a meal. 11/18/23   [name], MD  predniSONE  (DELTASONE ) 20 MG tablet Take 2 tablets (40 mg total) by mouth daily with breakfast. For the next four days 07/18/24   Garrick Charleston, MD  QUEtiapine  (SEROQUEL ) 100 MG tablet Take 1 tablet (100 mg total) by mouth at bedtime. Patient not taking: Reported on 07/18/2024 11/21/23   Curry Leni DASEN, MD    Physical Exam: Vitals:   08/11/24 9143 08/11/24 0857 08/11/24 1015  BP: 121/77  105/72  Pulse: 72  64  Resp: 20  17  Temp: 97.6 F (36.4 C)    TempSrc: Oral    SpO2: 94%  95%  Weight:  99.8 kg    Physical Exam Vitals and nursing note reviewed.  Constitutional:      General: He is awake. He is not in acute distress.    Appearance: He is obese. He is ill-appearing.  HENT:     Head: Normocephalic.     Nose: No rhinorrhea.     Mouth/Throat:     Mouth: Mucous membranes are moist.  Eyes:     General: No scleral icterus.    Pupils: Pupils are equal, round, and reactive to light.  Neck:     Vascular: No JVD.  Cardiovascular:     Rate and Rhythm: Normal rate and regular rhythm.     Heart sounds: S1 normal and S2 normal.  Pulmonary:     Effort: Pulmonary effort is normal.     Breath sounds: Normal breath sounds. No wheezing, rhonchi or rales.  Abdominal:     General: Bowel sounds are normal. There is no distension.     Palpations: Abdomen is soft.     Tenderness: There is no abdominal tenderness. There is no right CVA tenderness or left CVA tenderness.  Musculoskeletal:     Cervical back: Neck supple.     Right lower leg: No edema.     Left lower leg: No edema.     Right ankle: Swelling present. Decreased range  of motion.  Skin:    General: Skin is warm and dry.  Neurological:     General: No focal deficit present.     Mental Status: He is alert and oriented to person, place, and time.  Psychiatric:        Mood and Affect: Mood normal.        Behavior: Behavior normal. Behavior is cooperative.     Data Reviewed:  Results are pending, will review when available. 08/15/2017 echocardiogram report. Indications:      Tachycardia (R00.0).   -------------------------------------------------------------------  History:  PMH:   Transient ischemic attack.  Risk factors:  Orthostatic hypotension. Palpitation. Hypertension. Dyslipidemia.    -------------------------------------------------------------------  Study Conclusions   - Left ventricle: The cavity size was normal. Wall thickness was    normal. Systolic function was normal. The estimated ejection    fraction was in the range of 50% to 55%. Wall motion was normal;    there were no regional wall motion abnormalities. Doppler    parameters are consistent with abnormal left ventricular    relaxation (grade 1 diastolic dysfunction).  - Mitral valve: There was mild regurgitation.   Impressions:   - Normal LV systolic function; probable mild diastolic dysfunction;    mild MR.   EKG: Vent. rate 72 BPM PR interval 179 ms QRS duration 108 ms QT/QTcB 392/429 ms P-R-T axes 44 -25 -3 Sinus rhythm Borderline left axis deviation Abnormal R-wave progression, late transi  Assessment and Plan: Principal Problem:   Closed right ankle fracture Ankle has been immobilized. Admit to telemetry/inpatient. Analgesics as needed. Antiemetics as needed. Consult TOC team. Consult nutritional services. PT/OT evaluation after surgery. Orthopedic surgery input appreciated.  Active Problems:   Hyponatremia  Unknown clinical significance. At 1 point in the past with 132 mmol/L. Will recheck in the morning. Further workup depending on results.     Grade I diastolic dysfunction Also Hx of mild mitral regurgitation. No signs of decompensation. Continue metoprolol  succinate  100 mg p.o. daily. Will check echocardiogram.    Hyperlipidemia Continue atorvastatin  40 mg p.o. daily.    Seizure disorder (HCC) Continue lacosamide  150 mg p.o. twice daily.    Constipation, chronic Will be using opioids for the next few weeks. Begin Senokot 1 capsule twice daily. Begin MiraLAX  17 g p.o. daily.    Essential hypertension Continue metoprolol  as above.    Mild sleep apnea CPAP by bedtime.    DM (diabetes mellitus), type 2 (HCC) Carbohydrate modified diet. Continue metformin  1000 mg p.o. twice daily. CBG monitoring with RI SS. Check hemoglobin A1c.    Class 1 obesity Current BMI 32.49 kg/m. Would benefit from lifestyle modifications. Follow-up closely with PCP.      Advance Care Planning:   Code Status: Full Code   Consults: Orthopedic surgery (Dr. Oneil Priestly).  Family Communication:   Severity of Illness: The appropriate patient status for this patient is INPATIENT. Inpatient status is judged to be reasonable and necessary in order to provide the required intensity of service to ensure the patient's safety. The patient's presenting symptoms, physical exam findings, and initial radiographic and laboratory data in the context of their chronic comorbidities is felt to place them at high risk for further clinical deterioration. Furthermore, it is not anticipated that the patient will be medically stable for discharge from the hospital within 2 midnights of admission.   * I certify that at the point of admission it is my clinical judgment that the patient will require inpatient hospital care spanning beyond 2 midnights from the point of admission due to high intensity of service, high risk for further deterioration and high frequency of surveillance required.*  Author: Alm Dorn Castor, MD 08/11/2024 11:15 AM  For on call  review [email or web address].   This document was prepared using Dragon voice recognition software and may contain some unintended transcription errors.

## 2024-08-11 NOTE — ED Provider Notes (Signed)
 [place] EMERGENCY DEPARTMENT AT [facility] Provider Note   CSN: [id number] Arrival date & time: 08/11/24  9154     Patient presents with: Fall and Ankle Pain   [name] is a 68 y.o. male.   68 year old male with history of diabetes, HLD, hypertension, seizures, schizoaffective disorder, bilateral shoulder surgeries, left ankle/tibia surgery brought in by EMS after fall and R ankle injury.  States he was at his friend's house around 6:30am this morning and was getting up to leave when he felt a little lightheaded at the door and fell down onto the concrete right outside.  He twisted his right ankle in the process.  He reports he softly hit the side of his head but did not lose consciousness.  His head is not painful.  He did not fall down any stairs. Denies numbness, weakness, difficulty speaking. Fall was witnessed by his friend.  Denies any palpitations, chest pain, shortness of breath, abdominal pain, N/V.  Currently denies any lightheadedness or dizziness.  Currently his only concern is his right ankle.  He received 50 mcg of fentanyl  with EMS which is controlling his pain.  Denies taking blood thinners           Prior to Admission medications   Medication Sig Start Date End Date Taking? Authorizing Provider  amitriptyline  (ELAVIL ) 100 MG tablet Take 2 tablets (200 mg total) by mouth at bedtime. 11/21/23  Yes Arfeen, Leni DASEN, MD  atorvastatin  (LIPITOR) 40 MG tablet Take 1 tablet (40 mg total) by mouth daily. Patient taking differently: Take 40 mg by mouth at bedtime. 11/18/23  Yes [name], MD  Lacosamide  (VIMPAT ) 150 MG TABS Take 1 tablet (150 mg total) by mouth 2 (two) times daily. Patient taking differently: Take 1 tablet by mouth in the morning and at bedtime. 03/05/24  Yes Lomax, Amy, NP  metFORMIN  (GLUCOPHAGE -XR) 500 MG 24 hr tablet Take 2 tablets (1,000 mg total) by mouth daily. Patient taking differently: Take 1,000 mg by mouth at bedtime.  11/18/23  Yes [name], MD  metoprolol  succinate (TOPROL -XL) 100 MG 24 hr tablet Take 1 tablet (100 mg total) by mouth daily. Take with or immediately following a meal. 11/18/23  Yes [name], MD  QUEtiapine  (SEROQUEL ) 100 MG tablet Take 1 tablet (100 mg total) by mouth at bedtime. Patient not taking: Reported on 07/18/2024 11/21/23   Arfeen, Syed T, MD    Allergies: Codeine, Sulfa antibiotics, and Serotonin reuptake inhibitors (ssris)    Review of Systems  Constitutional:  Negative for fever.  Respiratory:  Negative for shortness of breath.   Cardiovascular:  Negative for chest pain and palpitations.  Gastrointestinal:  Negative for abdominal pain, nausea and vomiting.  Neurological:  Positive for light-headedness. Negative for dizziness, seizures, syncope, weakness, numbness and headaches.    Updated Vital Signs BP 105/72 (BP Location: Right Arm)   Pulse 64   Temp 97.6 F (36.4 C) (Oral)   Resp 17   Wt 99.8 kg   SpO2 95%   BMI 32.49 kg/m   Physical Exam Constitutional:      General: He is not in acute distress. HENT:     Head: Normocephalic and atraumatic.     Comments: No abrasions or lacerations noted    Mouth/Throat:     Pharynx: Oropharynx is clear.  Eyes:     Extraocular Movements: Extraocular movements intact.     Pupils: Pupils are equal, round, and reactive to light.  Neck:  Comments: No c spine tenderness Cardiovascular:     Rate and Rhythm: Normal rate and regular rhythm.     Heart sounds: Normal heart sounds. No murmur heard. Pulmonary:     Effort: Pulmonary effort is normal. No respiratory distress.     Breath sounds: Normal breath sounds.  Abdominal:     General: There is no distension.     Palpations: Abdomen is soft.     Tenderness: There is no abdominal tenderness.  Musculoskeletal:     Cervical back: Normal range of motion. No rigidity or tenderness.     Comments: R ankle in splint. RLE internally rotated. Superficial  abrasions noted on R medial ankle. Significant joint swelling noted. The R ankle is tender diffusely. Otherwise no deformity or tenderness noted in R hip or knee. Able to wiggle toes. Sensation intact in toes. 2+ DP pulse.  Neurological:     General: No focal deficit present.     Mental Status: He is alert. Mental status is at baseline.     Cranial Nerves: No cranial nerve deficit.     Sensory: No sensory deficit.     Motor: No weakness.     Comments: Unable to assess RLE due to pain     (all labs ordered are listed, but only abnormal results are displayed) Labs Reviewed  BASIC METABOLIC PANEL WITH GFR - Abnormal; Notable for the following components:      Result Value   Sodium 133 (*)    CO2 21 (*)    Glucose, Bld 136 (*)    BUN 24 (*)    All other components within normal limits  CBG MONITORING, ED - Abnormal; Notable for the following components:   Glucose-Capillary 132 (*)    All other components within normal limits  CBC WITH DIFFERENTIAL/PLATELET    EKG: EKG Interpretation Date/Time:  Tuesday August 11 2024 08:52:43 EDT Ventricular Rate:  72 PR Interval:  179 QRS Duration:  108 QT Interval:  392 QTC Calculation: 429 R Axis:   -25  Text Interpretation: Sinus rhythm Borderline left axis deviation Abnormal R-wave progression, late transition Confirmed by Garrick Charleston [phone]) on 08/11/2024 8:59:01 AM  Radiology: [facility] Ankle Complete Right Result Date: 08/11/2024 CLINICAL DATA:  Fall. EXAM: RIGHT ANKLE - COMPLETE 3+ VIEW COMPARISON:  None Available. FINDINGS: There is mildly displaced fracture of the lower fourth shaft of the right fibula. There is also displaced fracture of the medial malleolus with resultant disruption of ankle mortise. No other acute fracture or dislocation. No aggressive osseous lesion. Mild diffuse degenerative osteoarthritic changes of imaged joints. Calcaneal spur noted along the Achilles tendon attachment site. No focal soft tissue swelling. No  radiopaque foreign bodies. IMPRESSION: *Mildly displaced fracture of the lower fourth shaft of the right fibula. Displaced fracture of the medial malleolus with resultant disruption of ankle mortise. Electronically Signed   By: Ree Molt M.D.   On: 08/11/2024 09:49     Procedures   Medications Ordered in the ED  fentaNYL  (SUBLIMAZE ) injection 50 mcg (50 mcg Intravenous Given 08/11/24 1040)                                    Medical Decision Making 68 year old male with history of diabetes, HLD, hypertension, seizures, schizoaffective disorder, bilateral shoulder surgeries, left ankle/tibia surgery brought in by EMS after fall and R ankle injury.   Suspect episode of brief lightheadedness was related to  orthostatic vs vasovagal event, reassuringly these symptoms have resolved. EKG without acute arrhythmia or ischemia. He did hit his head however he has no neurologic deficits or abrasions/wounds/tenderness on exam. He is not on blood thinners.  Do not feel that head imaging is necessary at this time.  His c-spine is clear, nontender to palpation with normal ROM of neck, NEXUS 0, I removed his c-collar.  Obtain plain films of right ankle for further evaluation of his injury and swelling.  Obtain CBC, BMP to evaluate for any significant anemia or electrolyte abnormalities. CBG normal on arrival.  Pain currently controlled after receiving fentanyl  with EMS. Plan for reevaluation after imaging results.  10:07 AM X-ray shows: Mildly displaced fracture of the lower fourth shaft of the right fibula. Displaced fracture of the medial malleolus with resultant disruption of ankle mortise. Placed consult to orthopedics for further recommendations  10:55 AM Spoke with Dr. Beuford with orthopedics who spoke with Dr. Elsa and recommends posterior splinting w/ sugar tong, ideally discharge and f/u for outpatient surgery but if unable to tolerate crutches can admit and perform surgery inpatient.   Discussed with patient who would prefer admission as he has tried crutches unsuccessfully in the past when he broke his left leg and had multiple falls.  Placed consult to hospitalist for admission. Updated Dr. Beuford who agrees with plan and will pass along information to foot/ankle team.  11:16 AM Spoke with hospitalist on call who accepts patient for admission  Amount and/or Complexity of Data Reviewed Labs: ordered. Radiology: ordered.  Risk Prescription drug management. Decision regarding hospitalization.       Final diagnoses:  Displaced fracture of medial malleolus of right tibia, initial encounter for closed fracture  Closed fracture of shaft of right fibula, unspecified fracture morphology, initial encounter    ED Discharge Orders     None          Romelle Booty, MD 08/11/24 1119    Garrick Charleston, MD 08/13/24 1555

## 2024-08-11 NOTE — Progress Notes (Signed)
 Orthopedic Tech Progress Note Patient Details:  [name] Jan 13, 1956 [id number]  Ortho Devices Type of Ortho Device: Ace wrap, Cotton web roll, Post (short leg) splint, Stirrup splint Ortho Device/Splint Location: well padded short leg posterior and stirrup applied to right leg Ortho Device/Splint Interventions: Ordered, Application, Adjustment   Post Interventions Patient Tolerated: Well Instructions Provided: Adjustment of device, Care of device  Saul, Fabiano 08/11/2024, 10:58 AM

## 2024-08-12 ENCOUNTER — Inpatient Hospital Stay (HOSPITAL_COMMUNITY): Payer: MEDICAID

## 2024-08-12 ENCOUNTER — Other Ambulatory Visit: Payer: Self-pay | Admitting: Orthopaedic Surgery

## 2024-08-12 DIAGNOSIS — R55 Syncope and collapse: Secondary | ICD-10-CM

## 2024-08-12 DIAGNOSIS — I10 Essential (primary) hypertension: Secondary | ICD-10-CM

## 2024-08-12 DIAGNOSIS — G40909 Epilepsy, unspecified, not intractable, without status epilepticus: Secondary | ICD-10-CM

## 2024-08-12 LAB — COMPREHENSIVE METABOLIC PANEL WITH GFR
ALT: 46 U/L — ABNORMAL HIGH (ref 0–44)
AST: 31 U/L (ref 15–41)
Albumin: 4.2 g/dL (ref 3.5–5.0)
Alkaline Phosphatase: 101 U/L (ref 38–126)
Anion gap: 14 (ref 5–15)
BUN: 21 mg/dL (ref 8–23)
CO2: 22 mmol/L (ref 22–32)
Calcium: 9.2 mg/dL (ref 8.9–10.3)
Chloride: 101 mmol/L (ref 98–111)
Creatinine, Ser: 1.01 mg/dL (ref 0.61–1.24)
GFR, Estimated: >60 mL/min (ref >60)
Glucose, Bld: 135 mg/dL — ABNORMAL HIGH (ref 70–99)
Potassium: 4.1 mmol/L (ref 3.5–5.1)
Sodium: 136 mmol/L (ref 135–145)
Total Bilirubin: 0.7 mg/dL (ref 0.0–1.2)
Total Protein: 6.8 g/dL (ref 6.5–8.1)

## 2024-08-12 LAB — CBC
HCT: 41.4 % (ref 39.0–52.0)
Hemoglobin: 14.3 g/dL (ref 13.0–17.0)
MCH: 32.6 pg (ref 26.0–34.0)
MCHC: 34.5 g/dL (ref 30.0–36.0)
MCV: 94.3 fL (ref 80.0–100.0)
Platelets: 191 K/uL (ref 150–400)
RBC: 4.39 MIL/uL (ref 4.22–5.81)
RDW: 13.2 % (ref 11.5–15.5)
WBC: 9.2 K/uL (ref 4.0–10.5)
nRBC: 0 % (ref 0.0–0.2)

## 2024-08-12 LAB — GLUCOSE, CAPILLARY
Glucose-Capillary: 145 mg/dL — ABNORMAL HIGH (ref 70–99)
Glucose-Capillary: 186 mg/dL — ABNORMAL HIGH (ref 70–99)
Glucose-Capillary: 113 mg/dL — ABNORMAL HIGH (ref 70–99)
Glucose-Capillary: 164 mg/dL — ABNORMAL HIGH (ref 70–99)

## 2024-08-12 LAB — HEMOGLOBIN A1C
Hgb A1c MFr Bld: 6.9 % — ABNORMAL HIGH (ref 4.8–5.6)
Mean Plasma Glucose: 151.33 mg/dL

## 2024-08-12 LAB — HIV ANTIBODY (ROUTINE TESTING W REFLEX): HIV Screen 4th Generation wRfx: NONREACTIVE

## 2024-08-12 LAB — ECHOCARDIOGRAM COMPLETE
AR max vel: 2.18 cm2
AV Area VTI: 2.3 cm2
AV Area mean vel: 2.25 cm2
AV Mean grad: 2 mmHg
AV Peak grad: 3.7 mmHg
Ao pk vel: 0.96 m/s
Area-P 1/2: 3.15 cm2
Height: 69 in
S' Lateral: 3.2 cm
Weight: 3520 [oz_av]

## 2024-08-12 LAB — SURGICAL PCR SCREEN
MRSA, PCR: NEGATIVE
Staphylococcus aureus: NEGATIVE

## 2024-08-12 MED ORDER — MUPIROCIN 2 % EX OINT
1.0000 | TOPICAL_OINTMENT | Freq: Two times a day (BID) | CUTANEOUS | Status: DC
  Filled 2024-08-12: qty 22

## 2024-08-12 NOTE — TOC Initial Note (Signed)
 Transition of Care [facility]) - Initial/Assessment Note    Patient Details  Name: [name] MRN: [id number] Date of Birth: [birth year]  Transition of Care [facility]) CM/SW Contact:    Toy LITTIE Agar, RN Phone Number:[phone]  08/12/2024, 2:52 PM  Clinical Narrative:                 IP Care management acknowledges general consult for Health / DME Needs. Currently there are no identified needs. Following.     Barriers to Discharge: No Barriers Identified, Continued Medical Work up   Patient Goals and CMS Choice            Expected Discharge Plan and Services                                              Prior Living Arrangements/Services                       Activities of Daily Living   ADL Screening (condition at time of admission) Independently performs ADLs?: No Does the patient have a NEW difficulty with bathing/dressing/toileting/self-feeding that is expected to last >3 days?: Yes (Initiates electronic notice to provider for possible OT consult) Does the patient have a NEW difficulty with getting in/out of bed, walking, or climbing stairs that is expected to last >3 days?: Yes (Initiates electronic notice to provider for possible PT consult) Does the patient have a NEW difficulty with communication that is expected to last >3 days?: No Is the patient deaf or have difficulty hearing?: No Does the patient have difficulty seeing, even when wearing glasses/contacts?: No Does the patient have difficulty concentrating, remembering, or making decisions?: No  Permission Sought/Granted                  Emotional Assessment              Admission diagnosis:  Closed right ankle fracture [S82.891A] Displaced fracture of medial malleolus of right tibia, initial encounter for closed fracture [S82.51XA] Closed fracture of shaft of right fibula, unspecified fracture morphology, initial encounter [S82.401A] Patient Active Problem List   Diagnosis Date  Noted   Closed right ankle fracture 08/11/2024   Class 1 obesity 08/11/2024   Grade I diastolic dysfunction 08/11/2024   Hyponatremia 08/11/2024   Routine adult health maintenance 11/20/2023   Eczema 11/19/2022   Thrush, oral 08/28/2022   Cerumen impaction 08/28/2022   Leukocytosis 02/08/2022   Abnormal colonoscopy 02/08/2022   Diabetic peripheral neuropathy associated with type 2 diabetes mellitus (HCC) 07/25/2021   Squamous cell cancer of skin of left forearm 06/27/2021   Cough 06/27/2021   DM (diabetes mellitus), type 2 (HCC) 06/27/2021   Ear itching 06/01/2021   Chronic insomnia 04/14/2020   Acute metabolic encephalopathy 01/24/2019   Encephalopathy acute 01/24/2019   Closed tibia fracture 11/25/2018   Fracture of tibial shaft, left, closed 11/25/2018   Unintentional weight loss 04/07/2018   Syncope and collapse 12/13/2017   Memory disorder 10/25/2017   Presbyacusia 07/26/2017   Palpitations 07/01/2017   Acute bronchiolitis due to human metapneumovirus    Mild sleep apnea 06/15/2016   Actinic keratosis 06/15/2016   Nocturnal dyspnea 02/03/2016   Skin lesion of face 02/03/2016   Paresthesia of both hands 09/16/2015   Phenytoin  toxicity    TIA (transient ischemic attack) 07/29/2015   Vertigo 07/29/2015  Schizoaffective disorder, depressive type with good prognostic features (HCC) 07/13/2015   Constipation    Verruca 05/18/2015   Seborrheic dermatitis of scalp 05/18/2015   MDD (major depressive disorder), recurrent severe, without psychosis (HCC)    OCD (obsessive compulsive disorder)    Seizures (HCC)    Schizoaffective disorder, depressive type (HCC)    Orthostatic hypotension    Fall    Seizure (HCC) 11/18/2014   Schizoaffective disorder, unspecified type (HCC)    Essential hypertension    Overdose of beta-adrenergic antagonist drug 10/22/2014   Overdose    Nocturnal enuresis 03/23/2014   Benzodiazepine dependence, continuous (HCC) 01/27/2014   Severe major  depression (HCC) 01/21/2014   Frozen shoulder syndrome 09/20/2013   Somnolence 09/20/2013   Schizoaffective disorder (HCC) 09/20/2013   Tachycardia 02/05/2013   Presbyopia 04/25/2012   Androgen deficiency 09/07/2011   Constipation, chronic 09/07/2011   Other specified disorders of liver 12/30/2008   Overweight(278.02) 09/08/2008   Hyperlipidemia 01/28/2008   Seizure disorder (HCC) 01/28/2008   OSTEOPENIA 07/01/2007   PCP:  [name], MD Pharmacy:   [id number] [address] [place], [place] - [address] [place] [address] Phone: [phone] Fax: [phone]     Social Drivers of Health (SDOH) Social History: SDOH Screenings   Food Insecurity: Food Insecurity Present (08/11/2024)  Housing: Low Risk  (08/11/2024)  Transportation Needs: Unmet Transportation Needs (08/11/2024)  Utilities: At Risk (08/11/2024)  Alcohol Screen: Low Risk  (08/04/2018)  Depression (PHQ2-9): High Risk (07/23/2024)  Financial Resource Strain: Low Risk  (08/04/2018)  Physical Activity: Inactive (08/04/2018)  Social Connections: Unknown (08/11/2024)  Stress: No Stress Concern Present (08/04/2018)  Tobacco Use: Low Risk  (08/11/2024)   SDOH Interventions:     Readmission Risk Interventions     No data to display

## 2024-08-12 NOTE — Progress Notes (Signed)
   08/12/24 2334  BiPAP/CPAP/SIPAP  Reason BIPAP/CPAP not in use Non-compliant

## 2024-08-12 NOTE — Plan of Care (Signed)
  Problem: Education: Goal: Knowledge of General Education information will improve Description: Including pain rating scale, medication(s)/side effects and non-pharmacologic comfort measures Outcome: Progressing   Problem: Clinical Measurements: Goal: Ability to maintain clinical measurements within normal limits will improve Outcome: Progressing Goal: Respiratory complications will improve Outcome: Progressing Goal: Cardiovascular complication will be avoided Outcome: Progressing   Problem: Nutrition: Goal: Adequate nutrition will be maintained Outcome: Progressing   Problem: Coping: Goal: Level of anxiety will decrease Outcome: Progressing   Problem: Nutritional: Goal: Maintenance of adequate nutrition will improve Outcome: Progressing

## 2024-08-12 NOTE — Progress Notes (Signed)
 OT Cancellation Note  Patient Details Name: [name] MRN: [id number] DOB: Sep 24, 1956   Cancelled Treatment:    Reason Eval/Treat Not Completed: (P) Other (comment), surgery scheduled tomorrow, Pt resting comfortably in bed, good BUE strength/ROM, bed mobility good, will return after scheduled surgery to reassess level of function.   [name] 08/12/2024, 2:09 PM

## 2024-08-12 NOTE — Progress Notes (Signed)
 [name] PROGRESS NOTE   [name] FMW:[id number] DOB: [birth year] DOA: 08/11/2024  PCP: [name], MD  Brief History: 68 y.o. male with medical history significant of anxiety, depression, schizoaffective disorder, bowel obstruction, chronic anemia, colon polyps, constipation, GERD, type 2 diabetes, hyperlipidemia, hypertension, memory loss, MRSA carrier, history of pneumonia, seizure disorder, skin cancer who was brought to the emergency department after he had a fall.  Per patient, he stood up too fast, felt dizzy and then fell down.  He hit his head, but stated not very hard.  He did not lose consciousness.  Imaging studies revealed right ankle fracture.  He was hospitalized for further management.    Consultants: Orthopedics  Procedures: None yet    Subjective/Interval History: Denies any significant pain in the right lower extremity.  No chest pain or shortness of breath.    Assessment/Plan:  Right ankle fracture Management per orthopedics.  Pain control.  DVT prophylaxis will depend on timing of surgical intervention if any.  Hyponatremia Likely due to mild hypovolemia.  Has improved with IV hydration.  Grade 1 diastolic dysfunction/history of mitral regurgitation Stable from a cardiac standpoint.  Echocardiogram has been ordered and is pending.  Essential hypertension Noted to be on metoprolol .  Blood pressure is reasonably well-controlled.  Hyperlipidemia Continue statin.  Seizure disorder Continue lacosamide .  Chronic constipation Continue bowel regimen.  Diabetes mellitus type 2 Noted to be on metformin  which is being continued.  SSI.  Check CBGs. HbA1c is 6.9.  Sleep apnea CPAP  Class I obesity Estimated body mass index is 32.49 kg/m as calculated from the following:   Height as of this encounter: 5' 9 (1.753 m).   Weight as of this encounter: 99.8 kg.   DVT Prophylaxis: Definitive prophylaxis once timing of surgery is  known Code Status: Full code Family Communication: Discussed with patient Disposition Plan: To be determined  Status is: Inpatient Remains inpatient appropriate because: Right ankle fracture resulting in immobility      Medications: Scheduled:  amitriptyline   200 mg Oral QHS   atorvastatin   40 mg Oral QHS   insulin  aspart  0-15 Units Subcutaneous TID WC   lacosamide   150 mg Oral BID   metFORMIN   1,000 mg Oral Q breakfast   metoprolol  succinate  100 mg Oral Daily   polyethylene glycol  17 g Oral Daily   senna-docusate  1 tablet Oral BID   Continuous: PRN:acetaminophen  **OR** acetaminophen , ondansetron  **OR** ondansetron  (ZOFRAN ) IV, oxyCODONE   Antibiotics: Anti-infectives (From admission, onward)    None       Objective:  Vital Signs  Vitals:   08/11/24 1730 08/11/24 2115 08/12/24 0055 08/12/24 0627  BP: 122/67 125/74 119/77 (!) 116/59  Pulse: 80 71 74 65  Resp:  14 14 14   Temp: 98 F (36.7 C) 98.2 F (36.8 C) (!) 97.4 F (36.3 C) (!) 97.5 F (36.4 C)  TempSrc: Oral Oral Oral Oral  SpO2: 94% 97% 95% 95%  Weight:      Height:        Intake/Output Summary (Last 24 hours) at 08/12/2024 0902 Last data filed at 08/12/2024 9371 Gross per 24 hour  Intake 240 ml  Output 775 ml  Net -535 ml   Filed Weights   08/11/24 0857 08/11/24 1259  Weight: 99.8 kg 99.8 kg    General appearance: Awake alert.  In no distress Resp: Clear to auscultation bilaterally.  Normal effort Cardio: S1-S2 is normal regular.  No S3-S4.  No rubs  murmurs or bruit GI: Abdomen is soft.  Nontender nondistended.  Bowel sounds are present normal.  No masses organomegaly Extremities: Right leg is covered in a cast Neurologic: Alert and oriented x3.  No focal neurological deficits.    Lab Results:  Data Reviewed: I have personally reviewed following labs and reports of the imaging studies  CBC: Recent Labs  Lab 08/11/24 0909 08/12/24 0656  WBC 9.7 9.2  NEUTROABS 7.6  --   HGB 13.3  14.3  HCT 40.0 41.4  MCV 93.9 94.3  PLT 194 191    Basic Metabolic Panel: Recent Labs  Lab 08/11/24 0909 08/12/24 0656  NA 133* 136  K 4.3 4.1  CL 102 101  CO2 21* 22  GLUCOSE 136* 135*  BUN 24* 21  CREATININE 1.06 1.01  CALCIUM  8.9 9.2    GFR: Estimated Creatinine Clearance: 79.2 mL/min (by C-G formula based on SCr of 1.01 mg/dL).  Liver Function Tests: Recent Labs  Lab 08/12/24 0656  AST 31  ALT 46*  ALKPHOS 101  BILITOT 0.7  PROT 6.8  ALBUMIN  4.2    HbA1C: Recent Labs    08/11/24 1748  HGBA1C 6.9*    CBG: Recent Labs  Lab 08/11/24 0854 08/11/24 1741 08/11/24 2116 08/12/24 0754  GLUCAP 132* 183* 119* 145*     Radiology Studies: DG Chest Port 1 View Result Date: 08/11/2024 CLINICAL DATA:  Preop cardiovascular exam. EXAM: PORTABLE CHEST 1 VIEW COMPARISON:  July 18, 2024. FINDINGS: The heart size and mediastinal contours are within normal limits. Both lungs are clear. Status post left shoulder arthroplasty. IMPRESSION: No active disease. Electronically Signed   By: Lynwood Landy Raddle M.D.   On: 08/11/2024 14:06   DG Ankle Complete Right Result Date: 08/11/2024 CLINICAL DATA:  Fall. EXAM: RIGHT ANKLE - COMPLETE 3+ VIEW COMPARISON:  None Available. FINDINGS: There is mildly displaced fracture of the lower fourth shaft of the right fibula. There is also displaced fracture of the medial malleolus with resultant disruption of ankle mortise. No other acute fracture or dislocation. No aggressive osseous lesion. Mild diffuse degenerative osteoarthritic changes of imaged joints. Calcaneal spur noted along the Achilles tendon attachment site. No focal soft tissue swelling. No radiopaque foreign bodies. IMPRESSION: *Mildly displaced fracture of the lower fourth shaft of the right fibula. Displaced fracture of the medial malleolus with resultant disruption of ankle mortise. Electronically Signed   By: Ree Molt M.D.   On: 08/11/2024 09:49       LOS: 1 day    [name]  [name] Pager on [email or web address]  08/12/2024, 9:02 AM

## 2024-08-12 NOTE — Progress Notes (Signed)
   08/12/24 1242  TOC Brief Assessment  Insurance and Status Lapsed  Patient has primary care physician Yes Bucky, Laymon PARAS, MD)  Home environment has been reviewed Home  Prior level of function: Independent  Prior/Current Home Services No current home services  Social Drivers of Health Review SDOH reviewed needs interventions (No insurance listed will follow up)  Readmission risk has been reviewed Yes  Transition of care needs transition of care needs identified, TOC will continue to follow

## 2024-08-12 NOTE — Plan of Care (Signed)
  Problem: Clinical Measurements: Goal: Will remain free from infection Outcome: Progressing Goal: Diagnostic test results will improve Outcome: Progressing   Problem: Elimination: Goal: Will not experience complications related to bowel motility Outcome: Progressing Goal: Will not experience complications related to urinary retention Outcome: Progressing   Problem: Pain Managment: Goal: General experience of comfort will improve and/or be controlled Outcome: Progressing

## 2024-08-12 NOTE — Plan of Care (Signed)

## 2024-08-13 ENCOUNTER — Inpatient Hospital Stay (HOSPITAL_COMMUNITY): Payer: MEDICAID

## 2024-08-13 ENCOUNTER — Encounter (HOSPITAL_COMMUNITY)
Admission: EM | Disposition: A | Discharge status: Skilled Nursing Facility | Payer: Self-pay | Source: Home / Self Care | Attending: Internal Medicine

## 2024-08-13 ENCOUNTER — Ambulatory Visit (HOSPITAL_COMMUNITY): Payer: Self-pay | Admitting: Psychiatry

## 2024-08-13 ENCOUNTER — Inpatient Hospital Stay (HOSPITAL_COMMUNITY): Payer: MEDICAID | Admitting: Anesthesiology

## 2024-08-13 ENCOUNTER — Encounter (HOSPITAL_COMMUNITY): Payer: Self-pay | Admitting: Internal Medicine

## 2024-08-13 DIAGNOSIS — S82851A Displaced trimalleolar fracture of right lower leg, initial encounter for closed fracture: Secondary | ICD-10-CM

## 2024-08-13 DIAGNOSIS — E785 Hyperlipidemia, unspecified: Secondary | ICD-10-CM

## 2024-08-13 DIAGNOSIS — I10 Essential (primary) hypertension: Secondary | ICD-10-CM

## 2024-08-13 DIAGNOSIS — F418 Other specified anxiety disorders: Secondary | ICD-10-CM

## 2024-08-13 HISTORY — PX: SYNDESMOSIS REPAIR: SHX5182

## 2024-08-13 HISTORY — PX: ORIF ANKLE FRACTURE: SHX5408

## 2024-08-13 LAB — CBC
HCT: 39.4 % (ref 39.0–52.0)
Hemoglobin: 13.9 g/dL (ref 13.0–17.0)
MCH: 32 pg (ref 26.0–34.0)
MCHC: 35.3 g/dL (ref 30.0–36.0)
MCV: 90.6 fL (ref 80.0–100.0)
Platelets: 186 K/uL (ref 150–400)
RBC: 4.35 MIL/uL (ref 4.22–5.81)
RDW: 12.9 % (ref 11.5–15.5)
WBC: 12.1 K/uL — ABNORMAL HIGH (ref 4.0–10.5)
nRBC: 0 % (ref 0.0–0.2)

## 2024-08-13 LAB — GLUCOSE, CAPILLARY
Glucose-Capillary: 133 mg/dL — ABNORMAL HIGH (ref 70–99)
Glucose-Capillary: 227 mg/dL — ABNORMAL HIGH (ref 70–99)
Glucose-Capillary: 119 mg/dL — ABNORMAL HIGH (ref 70–99)
Glucose-Capillary: 158 mg/dL — ABNORMAL HIGH (ref 70–99)
Glucose-Capillary: 229 mg/dL — ABNORMAL HIGH (ref 70–99)

## 2024-08-13 SURGERY — OPEN REDUCTION INTERNAL FIXATION (ORIF) ANKLE FRACTURE
Anesthesia: Regional | Site: Ankle | Laterality: Right

## 2024-08-13 MED ORDER — FENTANYL CITRATE PF 50 MCG/ML IJ SOSY
PREFILLED_SYRINGE | INTRAMUSCULAR | Status: AC
  Filled 2024-08-13: qty 2

## 2024-08-13 MED ORDER — ALBUTEROL SULFATE (2.5 MG/3ML) 0.083% IN NEBU
INHALATION_SOLUTION | RESPIRATORY_TRACT | Status: AC
  Filled 2024-08-13: qty 3

## 2024-08-13 MED ORDER — LIDOCAINE HCL (CARDIAC) PF 100 MG/5ML IV SOSY
PREFILLED_SYRINGE | INTRAVENOUS | Status: DC | PRN
  Administered 2024-08-13: 40 mg via INTRAVENOUS

## 2024-08-13 MED ORDER — PHENYLEPHRINE HCL-NACL 20-0.9 MG/250ML-% IV SOLN
INTRAVENOUS | Status: DC | PRN
  Administered 2024-08-13: 50 ug/min via INTRAVENOUS

## 2024-08-13 MED ORDER — CHLORHEXIDINE GLUCONATE 0.12 % MT SOLN: 15.0000 mL | Freq: Once | OROMUCOSAL | Status: DC

## 2024-08-13 MED ORDER — ORAL CARE MOUTH RINSE: 15.0000 mL | Freq: Once | OROMUCOSAL | Status: DC

## 2024-08-13 MED ORDER — LACTATED RINGERS IV SOLN: INTRAVENOUS | Status: DC | PRN

## 2024-08-13 MED ORDER — SUGAMMADEX SODIUM 200 MG/2ML IV SOLN
INTRAVENOUS | Status: DC | PRN
  Administered 2024-08-13: 200 mg via INTRAVENOUS

## 2024-08-13 MED ORDER — LACTATED RINGERS IV SOLN: INTRAVENOUS | Status: DC

## 2024-08-13 MED ORDER — DEXAMETHASONE SODIUM PHOSPHATE 10 MG/ML IJ SOLN
INTRAMUSCULAR | Status: DC | PRN
Start: 2024-08-13 — End: 2024-08-13
  Administered 2024-08-13: 5 mg via INTRAVENOUS

## 2024-08-13 MED ORDER — HYDROXYZINE HCL 25 MG PO TABS
25.0000 mg | ORAL_TABLET | Freq: Four times a day (QID) | ORAL | Status: DC | PRN
  Administered 2024-08-14 – 2024-08-31 (×33): 25 mg via ORAL
  Filled 2024-08-13 (×35): qty 1

## 2024-08-13 MED ORDER — SODIUM CHLORIDE 0.9 % IV SOLN
3.0000 g | Freq: Four times a day (QID) | INTRAVENOUS | Status: DC
  Administered 2024-08-13 – 2024-08-16 (×11): 3 g via INTRAVENOUS
  Filled 2024-08-13 (×12): qty 8

## 2024-08-13 MED ORDER — ACETAMINOPHEN 10 MG/ML IV SOLN
INTRAVENOUS | Status: AC
  Filled 2024-08-13: qty 100

## 2024-08-13 MED ORDER — CEFAZOLIN SODIUM-DEXTROSE 2-3 GM-%(50ML) IV SOLR
INTRAVENOUS | Status: DC | PRN
  Administered 2024-08-13: 2 g via INTRAVENOUS

## 2024-08-13 MED ORDER — POVIDONE-IODINE 10 % EX SWAB: 2.0000 | Freq: Once | CUTANEOUS | Status: DC

## 2024-08-13 MED ORDER — BUPIVACAINE-EPINEPHRINE (PF) 0.5% -1:200000 IJ SOLN
INTRAMUSCULAR | Status: DC | PRN
  Administered 2024-08-13: 30 mL via PERINEURAL

## 2024-08-13 MED ORDER — ALBUMIN HUMAN 5 % IV SOLN: INTRAVENOUS | Status: DC | PRN

## 2024-08-13 MED ORDER — EPHEDRINE SULFATE (PRESSORS) 50 MG/ML IJ SOLN
INTRAMUSCULAR | Status: DC | PRN
  Administered 2024-08-13 (×3): 5 mg via INTRAVENOUS

## 2024-08-13 MED ORDER — AMISULPRIDE (ANTIEMETIC) 5 MG/2ML IV SOLN: 10.0000 mg | Freq: Once | INTRAVENOUS | Status: DC | PRN

## 2024-08-13 MED ORDER — MELATONIN 3 MG PO TABS
3.0000 mg | ORAL_TABLET | Freq: Every day | ORAL | Status: DC
  Administered 2024-08-13 – 2024-08-30 (×18): 3 mg via ORAL
  Filled 2024-08-13 (×18): qty 1

## 2024-08-13 MED ORDER — BUPIVACAINE HCL (PF) 0.25 % IJ SOLN
INTRAMUSCULAR | Status: DC | PRN
Start: 2024-08-13 — End: 2024-08-13
  Administered 2024-08-13: 20 mL via PERINEURAL

## 2024-08-13 MED ORDER — ORAL CARE MOUTH RINSE: 15.0000 mL | OROMUCOSAL | Status: DC | PRN

## 2024-08-13 MED ORDER — ROCURONIUM 10MG/ML (10ML) SYRINGE FOR MEDFUSION PUMP - OPTIME
INTRAVENOUS | Status: DC | PRN
  Administered 2024-08-13: 40 mg via INTRAVENOUS

## 2024-08-13 MED ORDER — INSULIN ASPART 100 UNIT/ML IJ SOLN
3.0000 [IU] | Freq: Once | INTRAMUSCULAR | Status: AC
  Administered 2024-08-13: 3 [IU] via SUBCUTANEOUS

## 2024-08-13 MED ORDER — FENTANYL CITRATE (PF) 100 MCG/2ML IJ SOLN
INTRAMUSCULAR | Status: AC
  Filled 2024-08-13: qty 2

## 2024-08-13 MED ORDER — CEFAZOLIN SODIUM-DEXTROSE 2-4 GM/100ML-% IV SOLN
2.0000 g | Freq: Four times a day (QID) | INTRAVENOUS | Status: AC
  Administered 2024-08-13 – 2024-08-14 (×2): 2 g via INTRAVENOUS
  Filled 2024-08-13 (×2): qty 100

## 2024-08-13 MED ORDER — CEFAZOLIN SODIUM-DEXTROSE 2-4 GM/100ML-% IV SOLN
2.0000 g | INTRAVENOUS | Status: DC
  Filled 2024-08-13: qty 100

## 2024-08-13 MED ORDER — ONDANSETRON HCL 4 MG/2ML IJ SOLN
INTRAMUSCULAR | Status: DC | PRN
  Administered 2024-08-13: 4 mg via INTRAVENOUS

## 2024-08-13 MED ORDER — PROPOFOL 10 MG/ML IV BOLUS
INTRAVENOUS | Status: AC
  Filled 2024-08-13: qty 20

## 2024-08-13 MED ORDER — FENTANYL CITRATE PF 50 MCG/ML IJ SOSY: 25.0000 ug | PREFILLED_SYRINGE | INTRAMUSCULAR | Status: DC | PRN

## 2024-08-13 MED ORDER — POVIDONE-IODINE 7.5 % EX SOLN: Freq: Once | CUTANEOUS | Status: DC

## 2024-08-13 MED ORDER — ACETAMINOPHEN 10 MG/ML IV SOLN
1000.0000 mg | Freq: Once | INTRAVENOUS | Status: DC | PRN
  Administered 2024-08-13: 1000 mg via INTRAVENOUS

## 2024-08-13 MED ORDER — MIDAZOLAM HCL 2 MG/2ML IJ SOLN
INTRAMUSCULAR | Status: AC
  Filled 2024-08-13: qty 2

## 2024-08-13 MED ORDER — ALBUTEROL SULFATE (2.5 MG/3ML) 0.083% IN NEBU
2.5000 mg | INHALATION_SOLUTION | Freq: Once | RESPIRATORY_TRACT | Status: AC
  Administered 2024-08-13: 2.5 mg via RESPIRATORY_TRACT

## 2024-08-13 MED ORDER — LIDOCAINE HCL (PF) 2 % IJ SOLN
INTRAMUSCULAR | Status: AC
  Filled 2024-08-13: qty 5

## 2024-08-13 MED ORDER — SUCCINYLCHOLINE CHLORIDE 200 MG/10ML IV SOSY
PREFILLED_SYRINGE | INTRAVENOUS | Status: DC | PRN
  Administered 2024-08-13: 100 mg via INTRAVENOUS

## 2024-08-13 MED ORDER — ENOXAPARIN SODIUM 40 MG/0.4ML IJ SOSY
40.0000 mg | PREFILLED_SYRINGE | INTRAMUSCULAR | Status: DC
  Administered 2024-08-14: 40 mg via SUBCUTANEOUS
  Filled 2024-08-13: qty 0.4

## 2024-08-13 MED ORDER — FENTANYL CITRATE (PF) 100 MCG/2ML IJ SOLN
INTRAMUSCULAR | Status: DC | PRN
  Administered 2024-08-13: 50 ug via INTRAVENOUS

## 2024-08-13 MED ORDER — DEXAMETHASONE SODIUM PHOSPHATE 10 MG/ML IJ SOLN
INTRAMUSCULAR | Status: AC
  Filled 2024-08-13: qty 1

## 2024-08-13 MED ORDER — ALBUTEROL SULFATE HFA 108 (90 BASE) MCG/ACT IN AERS
INHALATION_SPRAY | RESPIRATORY_TRACT | Status: DC | PRN
  Administered 2024-08-13 (×2): 2 via RESPIRATORY_TRACT

## 2024-08-13 MED ORDER — ONDANSETRON HCL 4 MG/2ML IJ SOLN
INTRAMUSCULAR | Status: AC
  Filled 2024-08-13: qty 2

## 2024-08-13 MED ORDER — IPRATROPIUM-ALBUTEROL 0.5-2.5 (3) MG/3ML IN SOLN
3.0000 mL | RESPIRATORY_TRACT | Status: DC | PRN
  Administered 2024-08-15 – 2024-08-18 (×3): 3 mL via RESPIRATORY_TRACT
  Filled 2024-08-13 (×3): qty 3

## 2024-08-13 MED ORDER — BUPIVACAINE HCL (PF) 0.5 % IJ SOLN
INTRAMUSCULAR | Status: AC
Start: 2024-08-13 — End: 2024-08-13
  Filled 2024-08-13: qty 30

## 2024-08-13 MED ORDER — PHENYLEPHRINE HCL (PRESSORS) 10 MG/ML IV SOLN
INTRAVENOUS | Status: DC | PRN
Start: 2024-08-13 — End: 2024-08-13
  Administered 2024-08-13 (×3): 80 ug via INTRAVENOUS

## 2024-08-13 MED ORDER — MIDAZOLAM HCL 2 MG/2ML IJ SOLN
2.0000 mg | Freq: Once | INTRAMUSCULAR | Status: AC
  Administered 2024-08-13: 2 mg via INTRAVENOUS

## 2024-08-13 MED ORDER — CHLORHEXIDINE GLUCONATE CLOTH 2 % EX PADS
6.0000 | MEDICATED_PAD | Freq: Every day | CUTANEOUS | Status: DC
  Administered 2024-08-13 – 2024-08-22 (×9): 6 via TOPICAL

## 2024-08-13 MED ORDER — BUPIVACAINE-EPINEPHRINE (PF) 0.5% -1:200000 IJ SOLN
INTRAMUSCULAR | Status: AC
  Filled 2024-08-13: qty 30

## 2024-08-13 MED ORDER — PROPOFOL 10 MG/ML IV BOLUS
INTRAVENOUS | Status: DC | PRN
  Administered 2024-08-13: 100 mg via INTRAVENOUS

## 2024-08-13 MED ORDER — FUROSEMIDE 10 MG/ML IJ SOLN
20.0000 mg | Freq: Once | INTRAMUSCULAR | Status: AC
  Administered 2024-08-13: 20 mg via INTRAVENOUS
  Filled 2024-08-13: qty 2

## 2024-08-13 MED ORDER — FENTANYL CITRATE PF 50 MCG/ML IJ SOSY
50.0000 ug | PREFILLED_SYRINGE | Freq: Once | INTRAMUSCULAR | Status: AC
  Administered 2024-08-13: 50 ug via INTRAVENOUS

## 2024-08-13 MED ORDER — ONDANSETRON HCL 4 MG/2ML IJ SOLN: 4.0000 mg | Freq: Once | INTRAMUSCULAR | Status: DC | PRN

## 2024-08-13 MED ORDER — 0.9 % SODIUM CHLORIDE (POUR BTL) OPTIME
TOPICAL | Status: DC | PRN
  Administered 2024-08-13: 1000 mL

## 2024-08-13 SURGICAL SUPPLY — 77 items
BAG COUNTER SPONGE SURGICOUNT (BAG) IMPLANT
BIT DRILL 2.5 CANN STRL (BIT) IMPLANT
BIT DRILL 2.6 CANN (BIT) IMPLANT
BIT DRILL 3.5MM (BIT) IMPLANT
BIT DRILL SRG 2.7XCANN AO CPLG (BIT) IMPLANT
BLADE SURG SZ10 CARB STEEL (BLADE) ×1 IMPLANT
BNDG COHESIVE 4X5 TAN STRL (GAUZE/BANDAGES/DRESSINGS) ×1 IMPLANT
BNDG COHESIVE 4X5 TAN STRL LF (GAUZE/BANDAGES/DRESSINGS) IMPLANT
BNDG COMPR ESMARK 6X3 LF (GAUZE/BANDAGES/DRESSINGS) ×1 IMPLANT
BNDG ELASTIC 4INX 5YD STR LF (GAUZE/BANDAGES/DRESSINGS) IMPLANT
BNDG ELASTIC 4X5.8 VLCR NS LF (GAUZE/BANDAGES/DRESSINGS) ×2 IMPLANT
BNDG ELASTIC 6INX 5YD STR LF (GAUZE/BANDAGES/DRESSINGS) ×1 IMPLANT
CHLORAPREP W/TINT 26 (MISCELLANEOUS) ×2 IMPLANT
CLOTH BEACON ORANGE TIMEOUT ST (SAFETY) ×1 IMPLANT
COVER MAYO STAND STRL (DRAPES) IMPLANT
COVER SURGICAL LIGHT HANDLE (MISCELLANEOUS) ×1 IMPLANT
CUFF TRNQT CYL 34X4.125X (TOURNIQUET CUFF) ×1 IMPLANT
DRAPE C-ARM 42X120 X-RAY (DRAPES) ×1 IMPLANT
DRAPE C-ARMOR (DRAPES) IMPLANT
DRAPE OEC MINIVIEW 54X84 (DRAPES) ×1 IMPLANT
DRAPE U-SHAPE 47X51 STRL (DRAPES) ×1 IMPLANT
ELECT PENCIL ROCKER SW 15FT (MISCELLANEOUS) ×1 IMPLANT
ELECT REM PT RETURN 15FT ADLT (MISCELLANEOUS) ×1 IMPLANT
ELECTRODE REM PT RTRN 9FT ADLT (ELECTROSURGICAL) ×1 IMPLANT
GAUZE PAD ABD 8X10 STRL (GAUZE/BANDAGES/DRESSINGS) ×2 IMPLANT
GAUZE SPONGE 4X4 12PLY STRL (GAUZE/BANDAGES/DRESSINGS) ×1 IMPLANT
GAUZE XEROFORM 5X9 LF (GAUZE/BANDAGES/DRESSINGS) ×1 IMPLANT
GLOVE BIOGEL M STRL SZ7.5 (GLOVE) ×1 IMPLANT
GLOVE BIOGEL PI IND STRL 8 (GLOVE) ×2 IMPLANT
GLOVE SURG LX STRL 7.5 STRW (GLOVE) ×2 IMPLANT
GOWN STRL REUS W/ TWL XL LVL3 (GOWN DISPOSABLE) ×2 IMPLANT
GOWN STRL REUS W/TWL XL LVL3 (GOWN DISPOSABLE) ×1 IMPLANT
GUIDEWIRE 1.35MM (WIRE) IMPLANT
KIT BASIN OR (CUSTOM PROCEDURE TRAY) ×1 IMPLANT
KIT TURNOVER KIT A (KITS) ×1 IMPLANT
KWIRE FX150X1.6XKRSH (WIRE) IMPLANT
KWIRE ORTHOPEDIC 1.4X150L (WIRE) IMPLANT
KWIRE SMOOTH 2.0X150 (WIRE) IMPLANT
MANIFOLD NEPTUNE II (INSTRUMENTS) ×1 IMPLANT
NDL HYPO 21X1.5 SAFETY (NEEDLE) ×1 IMPLANT
NEEDLE HYPO 21X1.5 SAFETY (NEEDLE) IMPLANT
NS IRRIG 1000ML POUR BTL (IV SOLUTION) ×1 IMPLANT
PACK ORTHO EXTREMITY (CUSTOM PROCEDURE TRAY) ×1 IMPLANT
PAD ARMBOARD POSITIONER FOAM (MISCELLANEOUS) ×1 IMPLANT
PAD CAST 4YDX4 CTTN HI CHSV (CAST SUPPLIES) ×1 IMPLANT
PADDING CAST SYNTHETIC 4X4 STR (CAST SUPPLIES) ×2 IMPLANT
PLATE LOCK ANK 134 10H NS (Plate) IMPLANT
SCREW CORT 3.5X18 THRD (Screw) IMPLANT
SCREW CORT FT ANKLE 3.5X48 (Screw) IMPLANT
SCREW LO-PRO TI 3.5X16MM (Screw) IMPLANT
SCREW LP 3.5X12MM (Screw) IMPLANT
SCREW LP TI 3.5X14MM (Screw) IMPLANT
SCREW NL FT KREULOCK 3.5X50 (Screw) IMPLANT
SCREW QCKFIX CANN 4.0X40MM (Screw) IMPLANT
SPIKE FLUID TRANSFER (MISCELLANEOUS) IMPLANT
SPLINT PLASTER CAST FAST 5X30 (CAST SUPPLIES) IMPLANT
SPLINT PLASTER CAST XFAST 5X30 (CAST SUPPLIES) ×20 IMPLANT
SPONGE T-LAP 18X18 ~~LOC~~+RFID (SPONGE) ×1 IMPLANT
STAPLER SKIN PROX 35W (STAPLE) IMPLANT
STAPLER SKIN PROX WIDE 3.9 (STAPLE) ×1 IMPLANT
STOCKINETTE TUBULAR SYNTH 4IN (CAST SUPPLIES) IMPLANT
STRAP ANKLE DISTRACTOR (MISCELLANEOUS) ×1 IMPLANT
STRIP CLOSURE SKIN 1/2X4 (GAUZE/BANDAGES/DRESSINGS) IMPLANT
SUCTION TUBE FRAZIER 10FR DISP (SUCTIONS) ×1 IMPLANT
SUT 3-0 BLK 1X30 PSL (SUTURE) IMPLANT
SUT ETHILON 3 0 PS 1 (SUTURE) IMPLANT
SUT MNCRL AB 3-0 PS2 18 (SUTURE) IMPLANT
SUT MON AB 2-0 CT1 36 (SUTURE) IMPLANT
SUT PDS AB 2-0 CT2 27 (SUTURE) IMPLANT
SUT VIC AB 0 CT1 36 (SUTURE) ×1 IMPLANT
SUT VIC AB 2-0 SH 27XBRD (SUTURE) ×1 IMPLANT
SUT VIC AB 3-0 FS2 27 (SUTURE) IMPLANT
SYR 30ML LL (SYRINGE) ×1 IMPLANT
SYR BULB IRRIG 60ML STRL (SYRINGE) ×1 IMPLANT
TOWEL OR 17X26 10 PK STRL BLUE (TOWEL DISPOSABLE) ×1 IMPLANT
UNDERPAD 30X36 HEAVY ABSORB (UNDERPADS AND DIAPERS) ×1 IMPLANT
YANKAUER SUCT BULB TIP 10FT TU (MISCELLANEOUS) ×1 IMPLANT

## 2024-08-13 NOTE — Anesthesia Postprocedure Evaluation (Addendum)
 Anesthesia Post Note  Patient: [name]  Procedure(s) Performed: OPEN REDUCTION INTERNAL FIXATION (ORIF) ANKLE FRACTURE (Right: Ankle) REPAIR, SYNDESMOSIS, ANKLE (Right: Ankle)     Patient location during evaluation: PACU Anesthesia Type: Regional Level of consciousness: awake Pain management: pain level controlled Vital Signs Assessment: post-procedure vital signs reviewed and stable Respiratory status: spontaneous breathing, nonlabored ventilation and respiratory function stable Cardiovascular status: blood pressure returned to baseline and stable Postop Assessment: no apparent nausea or vomiting Anesthetic complications: no Comments: The patient was requiring 4 LPM oxygen via nasal cannula in PACU to maintain his sats. They were hovering in the lows 90s and would increase to the mid 90s when he would take deep breaths in through his nose. He had wheezing on exam and received a nebulizer treatment and was given an incentive spirometer in which he was taking 1500 mL breaths. His respiratory rate was in the low 20s. He denied SOB. The wheezing improved somewhat after the nebulizer treatment but his lungs still sounded junky and he had a wet sounding cough. His SpO2 did not improve. Of note, he had also been seen in the ED on 07/18/2024 for SOB and workup at that time was negative for PE and pneumonia. Possible bronchitis was suspected and the patient was prescribed prednisone  and azithromycin . I ordered a CXR in PACU that was read as concerning for atypical infiltrates in the left lung. I discussed the patient with Dr. Verdene who agreed to place orders for the patient to be transferred to stepdown.   No notable events documented.  Last Vitals:  Vitals:   08/13/24 1800 08/13/24 1815  BP: 137/84 121/69  Pulse: 78 76  Resp: 18 (!) 21  Temp:    SpO2: 92% 95%    Last Pain:  Vitals:   08/13/24 1815  TempSrc:   PainSc: 4                  Delon Aisha Arch

## 2024-08-13 NOTE — Anesthesia Procedure Notes (Signed)
 Anesthesia Regional Block: Popliteal block   Pre-Anesthetic Checklist: , timeout performed,  Correct Patient, Correct Site, Correct Laterality,  Correct Procedure, Correct Position, site marked,  Risks and benefits discussed,  Surgical consent,  Pre-op evaluation,  At surgeon's request and post-op pain management  Laterality: Right  Prep: chloraprep       Needles:  Injection technique: Single-shot  Needle Type: Echogenic Stimulator Needle     Needle Length: 10cm  Needle Gauge: 20     Additional Needles:   Procedures:,,,, ultrasound used (permanent image in chart),,    Narrative:  Start time: 08/13/2024 2:20 PM End time: 08/13/2024 2:30 PM Injection made incrementally with aspirations every 5 mL.  Performed by: Personally  Anesthesiologist: Patrisha Bernardino SQUIBB, MD  Additional Notes: Functioning IV was confirmed and monitors were applied.  A timeout was performed. Sterile prep, hand hygiene and sterile gloves were used. A 20ga Bbraun echogenic stimulator needle was used. Negative aspiration and negative test dose prior to incremental administration of local anesthetic. The patient tolerated the procedure well.  Ultrasound guidance: relevent anatomy identified, needle position confirmed, local anesthetic spread visualized around nerve(s), vascular puncture avoided.  Image printed for medical record.

## 2024-08-13 NOTE — Progress Notes (Addendum)
     Patient Name: [name]           DOB: May 23, 1956  MRN: [id number]      Admission Date: 08/11/2024  Attending Provider: Verdene Purchase, MD  Primary Diagnosis: Closed right ankle fracture   Level of care: Stepdown   OVERNIGHT EVENT   HPI/ Events of Note  Handoff report: [name], 68 y.o. male, was admitted on 08/11/2024 for Closed right ankle fracture.   8/28- S/p right ankle ORIF repair. Anesthesia was unable to wean him off oxygen.  Patient is now requiring 4-6 L [place] to maintain O2 sats >92%.  Request was made to transfer patient to SDU for observation.  Chest x-ray findings concerning for atypical left infiltrate. Patient was started on Unasyn  and a 1x order of 20 mg IV Lasix  was also given.  Given acute change and need for O2, request was made to evaluate patient at bedside.   Bedside Assessment:  Patient is awake, A/O x4. He is resting comfortably in bed with no associated distress.   Hemodynamically stable  Vitals:   08/13/24 1815 08/13/24 1843  BP: 121/69 (!) 154/84  Pulse: 76 83  Resp: (!) 21 19  Temp:    SpO2: 95% (!) 89%    Denies dizziness, lightheadedness, CP, palpitations,  SOB, N/V, abdominal discomfort.  Endorses feeling anxious regarding above situation.  Has a history of depression and anxiety.  Respiratory: Bilaterally clear, no wheezing, no crackles.  Diminished bases.  Normal effort. No accessory muscle use.  Cardiovascular: Regular rate and rhythm.  Right extremity bandage, no edema to left extremity.  Abdomen: Abdomen is soft and nontender.  Obese.  Positive bowel sounds in all quadrants.    Plan: Agree with Unasyn  and Lasix  Incentive spirometry when awake DuoNeb as needed Titrate O2; goal is for SpO2> 92 Encourage use of CPAP at night given history of OSA and current O2 needs.  Patient states he does not use CPAP anymore? Melatonin as needed nightly for sleep Atarax  as needed for anxiety Procalcitonin   I reviewed with the patient the  plan above. All questions were answered. We discussed his chest x-ray findings and treatment plan including use of antibiotic for possible PNA and Lasix  for possible fluid overload.  He is understanding of the plan and has been encouraged to continue using incentive spirometry as well as to inform RN if he becomes short of breath. Bedside RN was updated.   Azlin Zilberman, DNP, ACNPC- AG [name] [place]

## 2024-08-13 NOTE — Consult Note (Signed)
 Reason for Consult: Right ankle fracture Referring Physician: Oneil Priestly, MD  [name] is an 68 y.o. male.  HPI: Patient was admitted to the hospital after a fall at home.  He was noted to have an ankle fracture and was unable to use crutches and therefore was admitted for pain.  I was consulted by the on-call orthopedic physician due to the severity and complexity of the injury.  Patient complains of pain in the right ankle.  He is interested in having it fixed urgently.  He has an unstable ankle fracture.  Denies pain elsewhere.  Past Medical History:  Diagnosis Date   Anxiety    Bowel obstruction (HCC)    Chronic insomnia 04/14/2020   Colon polyps    Constipation    Depression    Diabetes mellitus without complication (HCC)    Dyslipidemia    GERD (gastroesophageal reflux disease)    Hearing loss    History of echocardiogram    Echo 8/18: EF 50-55, normal wall motion, grade 1 diastolic dysfunction, mild MR   Hypertension    Memory difficulty 10/25/2017   Memory loss    MRSA carrier    Pneumonia    Schizoaffective disorder    Seizures (HCC)    last 4-5 years ago per pt   Skin cancer     Past Surgical History:  Procedure Laterality Date   CARPAL TUNNEL RELEASE     COLONOSCOPY  12/17/2008   COLONOSCOPY  07/23/2018   Dr.Jacobs   COLONOSCOPY WITH PROPOFOL  N/A 07/05/2022   Procedure: COLONOSCOPY WITH PROPOFOL ;  Surgeon: Teressa Toribio SQUIBB, MD;  Location: [facility];  Service: Gastroenterology;  Laterality: N/A;  Incomplete Colon   COLONOSCOPY WITH PROPOFOL  N/A 08/09/2022   Procedure: COLONOSCOPY WITH PROPOFOL ;  Surgeon: Wilhelmenia Aloha Raddle., MD;  Location: [facility];  Service: Gastroenterology;  Laterality: N/A;   ORIF ANKLE FRACTURE Left 11/26/2018   Procedure: OPEN REDUCTION INTERNAL FIXATION (ORIF) ANKLE FRACTURE;  Surgeon: Cristy Bonner DASEN, MD;  Location: [facility];  Service: Orthopedics;  Laterality: Left;   ORIF SHOULDER FRACTURE Right    Arthroplasty   POLYPECTOMY   08/09/2022   Procedure: POLYPECTOMY;  Surgeon: Mansouraty, Aloha Raddle., MD;  Location: [facility];  Service: Gastroenterology;;   self orchectomy Bilateral    SHOULDER CLOSED REDUCTION Left 09/16/2013   Procedure: CLOSED REDUCTION SHOULDER;  Surgeon: Donnice JONETTA Car, MD;  Location: [facility];  Service: Orthopedics;  Laterality: Left;   SHOULDER HEMI-ARTHROPLASTY Left 09/18/2013   Procedure: LEFT SHOULDER HEMI-ARTHROPLASTY;  Surgeon: Elspeth JONELLE Her, MD;  Location: [facility];  Service: Orthopedics;  Laterality: Left;   TIBIA IM NAIL INSERTION Left 11/26/2018   Procedure: INTRAMEDULLARY (IM) NAIL TIBIAL;  Surgeon: Cristy Bonner DASEN, MD;  Location: [facility];  Service: Orthopedics;  Laterality: Left;   UPPER GASTROINTESTINAL ENDOSCOPY  07/23/2018   Dr.Jacobs    Family History  Problem Relation Age of Onset   Stroke Mother        Stroke in late 39's. Died in her 41's   Fibromyalgia Mother    Stroke Father        Living at 27   Alcohol abuse Brother    Alcohol abuse Brother    Diabetes Brother    Colon cancer Neg Hx    Esophageal cancer Neg Hx    Stomach cancer Neg Hx    Rectal cancer Neg Hx     Social History:  reports that he has never smoked. He has never been exposed to  tobacco smoke. He has never used smokeless tobacco. He reports that he does not drink alcohol and does not use drugs.  Allergies:  Allergies  Allergen Reactions   Codeine Itching and Nausea Only   Sulfa Antibiotics Nausea And Vomiting and Other (See Comments)    Flu-like symptoms   Serotonin Reuptake Inhibitors (Ssris) Other (See Comments)    Restlessness    Medications: I have reviewed the patient's current medications.  Results for orders placed or performed during the hospital encounter of 08/11/24 (from the past 48 hours)  Glucose, capillary     Status: Abnormal   Collection Time: 08/11/24  5:41 PM  Result Value Ref Range   Glucose-Capillary 183 (H) 70 - 99 mg/dL    Comment: Glucose reference range applies only  to samples taken after fasting for at least 8 hours.  Hemoglobin A1c     Status: Abnormal   Collection Time: 08/11/24  5:48 PM  Result Value Ref Range   Hgb A1c MFr Bld 6.9 (H) 4.8 - 5.6 %    Comment: (NOTE) Diagnosis of Diabetes The following HbA1c ranges recommended by the American Diabetes Association (ADA) may be used as an aid in the diagnosis of diabetes mellitus.  Hemoglobin             Suggested A1C NGSP%              Diagnosis  <5.7                   Non Diabetic  5.7-6.4                Pre-Diabetic  >6.4                   Diabetic  <7.0                   Glycemic control for                       adults with diabetes.     Mean Plasma Glucose 151.33 mg/dL    Comment: Performed at [facility], [address] [place], [place] [address]  Glucose, capillary     Status: Abnormal   Collection Time: 08/11/24  9:16 PM  Result Value Ref Range   Glucose-Capillary 119 (H) 70 - 99 mg/dL    Comment: Glucose reference range applies only to samples taken after fasting for at least 8 hours.  HIV Antibody (routine testing w rflx)     Status: None   Collection Time: 08/12/24  6:56 AM  Result Value Ref Range   HIV Screen 4th Generation wRfx Non Reactive Non Reactive    Comment: Performed at [facility], [address], [place], [place] [address]  CBC     Status: None   Collection Time: 08/12/24  6:56 AM  Result Value Ref Range   WBC 9.2 4.0 - 10.5 K/uL   RBC 4.39 4.22 - 5.81 MIL/uL   Hemoglobin 14.3 13.0 - 17.0 g/dL   HCT 58.5 60.9 - 47.9 %   MCV 94.3 80.0 - 100.0 fL   MCH 32.6 26.0 - 34.0 pg   MCHC 34.5 30.0 - 36.0 g/dL   RDW 86.7 88.4 - 84.4 %   Platelets 191 150 - 400 K/uL   nRBC 0.0 0.0 - 0.2 %    Comment: Performed at [facility], [address], [place], [place] [address]  Comprehensive metabolic  panel     Status: Abnormal   Collection Time: 08/12/24  6:56 AM  Result Value Ref Range   Sodium 136 135 - 145 mmol/L   Potassium 4.1 3.5  - 5.1 mmol/L   Chloride 101 98 - 111 mmol/L   CO2 22 22 - 32 mmol/L   Glucose, Bld 135 (H) 70 - 99 mg/dL    Comment: Glucose reference range applies only to samples taken after fasting for at least 8 hours.   BUN 21 8 - 23 mg/dL   Creatinine, Ser 8.98 0.61 - 1.24 mg/dL   Calcium  9.2 8.9 - 10.3 mg/dL   Total Protein 6.8 6.5 - 8.1 g/dL   Albumin  4.2 3.5 - 5.0 g/dL   AST 31 15 - 41 U/L   ALT 46 (H) 0 - 44 U/L   Alkaline Phosphatase 101 38 - 126 U/L   Total Bilirubin 0.7 0.0 - 1.2 mg/dL   GFR, Estimated >39 >39 mL/min    Comment: (NOTE) Calculated using the CKD-EPI Creatinine Equation (2021)    Anion gap 14 5 - 15    Comment: Performed at [facility], [address], [place], [place] [address]  Glucose, capillary     Status: Abnormal   Collection Time: 08/12/24  7:54 AM  Result Value Ref Range   Glucose-Capillary 145 (H) 70 - 99 mg/dL    Comment: Glucose reference range applies only to samples taken after fasting for at least 8 hours.   Comment 1 Notify RN    Comment 2 Document in Chart   Glucose, capillary     Status: Abnormal   Collection Time: 08/12/24 11:25 AM  Result Value Ref Range   Glucose-Capillary 186 (H) 70 - 99 mg/dL    Comment: Glucose reference range applies only to samples taken after fasting for at least 8 hours.  Glucose, capillary     Status: Abnormal   Collection Time: 08/12/24  4:34 PM  Result Value Ref Range   Glucose-Capillary 113 (H) 70 - 99 mg/dL    Comment: Glucose reference range applies only to samples taken after fasting for at least 8 hours.  Glucose, capillary     Status: Abnormal   Collection Time: 08/12/24  9:10 PM  Result Value Ref Range   Glucose-Capillary 164 (H) 70 - 99 mg/dL    Comment: Glucose reference range applies only to samples taken after fasting for at least 8 hours.  Surgical PCR screen     Status: None   Collection Time: 08/12/24 10:36 PM   Specimen: Nasal Mucosa; Nasal Swab  Result Value Ref Range   MRSA,  PCR NEGATIVE NEGATIVE   Staphylococcus aureus NEGATIVE NEGATIVE    Comment: (NOTE) The Xpert SA Assay (FDA approved for NASAL specimens in patients 61 years of age and older), is one component of a comprehensive surveillance program. It is not intended to diagnose infection nor to guide or monitor treatment. Performed at [facility], [address] [place], [place] [address]   Glucose, capillary     Status: Abnormal   Collection Time: 08/13/24  7:50 AM  Result Value Ref Range   Glucose-Capillary 133 (H) 70 - 99 mg/dL    Comment: Glucose reference range applies only to samples taken after fasting for at least 8 hours.  Glucose, capillary     Status: Abnormal   Collection Time: 08/13/24 11:18 AM  Result Value Ref Range   Glucose-Capillary 227 (H) 70 - 99 mg/dL  Comment: Glucose reference range applies only to samples taken after fasting for at least 8 hours.  Glucose, capillary     Status: Abnormal   Collection Time: 08/13/24  1:33 PM  Result Value Ref Range   Glucose-Capillary 119 (H) 70 - 99 mg/dL    Comment: Glucose reference range applies only to samples taken after fasting for at least 8 hours.    ECHOCARDIOGRAM COMPLETE Result Date: 08/12/2024    ECHOCARDIOGRAM REPORT   Patient Name:   [name] Date of Exam: 08/12/2024 Medical Rec #:  [id number]    Height:       69.0 in Accession #:    [id number]   Weight:       220.0 lb Date of Birth:  Jun 11, 1956    BSA:          2.151 m Patient Age:    68 years     BP:           116/59 mmHg Patient Gender: M            HR:           64 bpm. Exam Location:  Inpatient Procedure: 2D Echo, Cardiac Doppler and Color Doppler (Both Spectral and Color            Flow Doppler were utilized during procedure). Indications:    Near syncope  History:        Patient has no prior history of Echocardiogram examinations.                 Risk Factors:Hypertension and Diabetes.  Sonographer:    Jayson Gaskins Referring Phys: [id number] DAVID MANUEL  ORTIZ IMPRESSIONS  1. Left ventricular ejection fraction, by estimation, is 50%. The left ventricle has low normal function. Left ventricular endocardial border not optimally defined to evaluate regional wall motion. Consider repeat study with contrast. Left ventricular diastolic parameters are consistent with Grade I diastolic dysfunction (impaired relaxation).  2. Right ventricular systolic function is low normal. The right ventricular size is normal.  3. The mitral valve is normal in structure. Trivial mitral valve regurgitation. No evidence of mitral stenosis.  4. The aortic valve is normal in structure. Aortic valve regurgitation is not visualized. No aortic stenosis is present.  5. The inferior vena cava is normal in size with greater than 50% respiratory variability, suggesting right atrial pressure of 3 mmHg. FINDINGS  Left Ventricle: Left ventricular ejection fraction, by estimation, is 50 to 55%. The left ventricle has low normal function. Left ventricular endocardial border not optimally defined to evaluate regional wall motion. The left ventricular internal cavity  size was normal in size. There is no left ventricular hypertrophy. Left ventricular diastolic parameters are consistent with Grade I diastolic dysfunction (impaired relaxation). Right Ventricle: The right ventricular size is normal. No increase in right ventricular wall thickness. Right ventricular systolic function is low normal. Left Atrium: Left atrial size was normal in size. Right Atrium: Right atrial size was normal in size. Pericardium: There is no evidence of pericardial effusion. Mitral Valve: The mitral valve is normal in structure. Trivial mitral valve regurgitation. No evidence of mitral valve stenosis. Tricuspid Valve: The tricuspid valve is normal in structure. Tricuspid valve regurgitation is not demonstrated. No evidence of tricuspid stenosis. Aortic Valve: The aortic valve is normal in structure. Aortic valve regurgitation is  not visualized. No aortic stenosis is present. Aortic valve mean gradient measures 2.0 mmHg. Aortic valve peak gradient measures 3.7 mmHg. Aortic valve area, by  VTI measures 2.30 cm. Pulmonic Valve: The pulmonic valve was grossly normal. Pulmonic valve regurgitation is not visualized. No evidence of pulmonic stenosis. Aorta: The aortic root is normal in size and structure. Venous: The inferior vena cava is normal in size with greater than 50% respiratory variability, suggesting right atrial pressure of 3 mmHg. IAS/Shunts: No atrial level shunt detected by color flow Doppler.  LEFT VENTRICLE PLAX 2D LVIDd:         4.55 cm   Diastology LVIDs:         3.20 cm   LV e' medial:    6.96 cm/s LV PW:         0.90 cm   LV E/e' medial:  10.0 LV IVS:        0.95 cm   LV e' lateral:   9.90 cm/s LVOT diam:     1.90 cm   LV E/e' lateral: 7.0 LV SV:         46 LV SV Index:   21 LVOT Area:     2.84 cm  RIGHT VENTRICLE RV S prime:     8.05 cm/s TAPSE (M-mode): 1.9 cm LEFT ATRIUM           Index        RIGHT ATRIUM          Index LA Vol (A2C): 44.6 ml 20.73 ml/m  RA Area:     9.86 cm LA Vol (A4C): 57.3 ml 26.63 ml/m  RA Volume:   18.40 ml 8.55 ml/m  AORTIC VALVE AV Area (Vmax):    2.18 cm AV Area (Vmean):   2.25 cm AV Area (VTI):     2.30 cm AV Vmax:           96.10 cm/s AV Vmean:          70.700 cm/s AV VTI:            0.201 m AV Peak Grad:      3.7 mmHg AV Mean Grad:      2.0 mmHg LVOT Vmax:         73.80 cm/s LVOT Vmean:        56.100 cm/s LVOT VTI:          0.163 m LVOT/AV VTI ratio: 0.81  AORTA Ao Root diam: 3.10 cm MITRAL VALVE MV Area (PHT): 3.15 cm    SHUNTS MV Decel Time: 241 msec    Systemic VTI:  0.16 m MV E velocity: 69.40 cm/s  Systemic Diam: 1.90 cm MV A velocity: 77.10 cm/s MV E/A ratio:  0.90 Aditya Sabharwal Electronically signed by Ria Commander Signature Date/Time: 08/12/2024/7:02:07 PM    Final     Review of Systems  Constitutional: Negative.   HENT: Negative.    Respiratory: Negative.     Musculoskeletal:        Right ankle pain  Skin: Negative.   All other systems reviewed and are negative.  Blood pressure (!) 146/85, pulse 76, temperature 98.7 F (37.1 C), temperature source Oral, resp. rate (!) 21, height 5' 9 (1.753 m), weight 99.8 kg, SpO2 95%. Physical Exam HENT:     Head: Normocephalic.     Mouth/Throat:     Mouth: Mucous membranes are dry.  Eyes:     Extraocular Movements: Extraocular movements intact.  Cardiovascular:     Rate and Rhythm: Normal rate.  Pulmonary:     Effort: Pulmonary effort is normal.  Abdominal:     General: Abdomen is flat.  Musculoskeletal:     Comments: Right ankle in a short leg splint.  Alignment acceptable.  No evidence of forefoot injury.  No tenderness distal or proximal to the splint.  Foot is warm and well-perfused.  Neurological:     General: No focal deficit present.     Mental Status: He is alert.  Psychiatric:        Mood and Affect: Mood normal.     Assessment/Plan: Right unstable ankle fracture after a fall.  Will plan for open treatment of his ankle fracture.  He may require syndesmosis fixation.  Will see.  I discussed the risk, benefits and alternatives to surgery which include but are not limited to wound healing complications, infection, nonunion, malunion need for further surgery.  He would like to proceed.  Patient is requesting a Foley catheter to be placed while he is asleep.  Will plan for that after the surgery.  Lonni JONELLE Pae 08/13/2024, 2:31 PM

## 2024-08-13 NOTE — Anesthesia Preprocedure Evaluation (Addendum)
 Anesthesia Evaluation  Patient identified by MRN, date of birth, ID band Patient awake    Reviewed: Allergy & Precautions, NPO status , Patient's Chart, lab work & pertinent test results  Airway Mallampati: II       Dental no notable dental hx.    Pulmonary    Pulmonary exam normal        Cardiovascular hypertension, Pt. on home beta blockers Normal cardiovascular exam  ECHO: 1. Left ventricular ejection fraction, by estimation, is 50% . The left ventricle has low normal function. Left ventricular endocardial border not optimally defined to evaluate regional wall motion. Consider repeat study with contrast. Left ventricular diastolic parameters are consistent with Grade I diastolic dysfunction ( impaired relaxation) . 2. Right ventricular systolic function is low normal. The right ventricular size is normal. 3. The mitral valve is normal in structure. Trivial mitral valve regurgitation. No evidence of mitral stenosis. 4. The aortic valve is normal in structure. Aortic valve regurgitation is not visualized. No aortic stenosis is present. 5. The inferior vena cava is normal in size with greater than 50% respiratory variability, suggesting right atrial pressure of 3 mmHg.   Neuro/Psych Seizures -,  PSYCHIATRIC DISORDERS Anxiety Depression  Schizophrenia  TIA Neuromuscular disease    GI/Hepatic negative GI ROS, Neg liver ROS,,,  Endo/Other  diabetes, Oral Hypoglycemic Agents    Renal/GU negative Renal ROS     Musculoskeletal  (+) Arthritis ,    Abdominal  (+) + obese  Peds  Hematology negative hematology ROS (+)   Anesthesia Other Findings RIGHT TRIMALLEOLAR ANKLE FRACTURE, SYNDESMOSIS DISRUPTION  Reproductive/Obstetrics                              Anesthesia Physical Anesthesia Plan  ASA: 3  Anesthesia Plan: General and Regional   Post-op Pain Management: Regional block*   Induction:  Intravenous  PONV Risk Score and Plan: 2 and Ondansetron , Dexamethasone , Midazolam  and Treatment may vary due to age or medical condition  Airway Management Planned: Oral ETT  Additional Equipment:   Intra-op Plan:   Post-operative Plan: Extubation in OR  Informed Consent: I have reviewed the patients History and Physical, chart, labs and discussed the procedure including the risks, benefits and alternatives for the proposed anesthesia with the patient or authorized representative who has indicated his/her understanding and acceptance.     Dental advisory given  Plan Discussed with: CRNA  Anesthesia Plan Comments:          Anesthesia Quick Evaluation

## 2024-08-13 NOTE — Transfer of Care (Signed)
 Immediate Anesthesia Transfer of Care Note  Patient: [name]  Procedure(s) Performed: OPEN REDUCTION INTERNAL FIXATION (ORIF) ANKLE FRACTURE (Right: Ankle) REPAIR, SYNDESMOSIS, ANKLE (Right: Ankle)  Patient Location: PACU  Anesthesia Type:General and Regional  Level of Consciousness: drowsy  Airway & Oxygen Therapy: Patient Spontanous Breathing and Patient connected to face mask oxygen  Post-op Assessment: Report given to RN and Post -op Vital signs reviewed and stable  Post vital signs: Reviewed and stable  Last Vitals:  Vitals Value Taken Time  BP 160/86 08/13/24 16:45  Temp    Pulse 89 08/13/24 16:46  Resp 23 08/13/24 16:46  SpO2 96 % 08/13/24 16:46  Vitals shown include unfiled device data.  Last Pain:  Vitals:   08/13/24 1440  TempSrc:   PainSc: 0-No pain      Patients Stated Pain Goal: 4 (08/13/24 1333)  Complications: No notable events documented.

## 2024-08-13 NOTE — Progress Notes (Signed)
 OT Cancellation Note  Patient Details Name: [name] MRN: [id number] DOB: 12-30-1955   Cancelled Treatment:    Reason Eval/Treat Not Completed: Medical issues which prohibited therapy. Pt is scheduled for surgical repair of ankle fracture today. Will hold off on OT eval until post-op.   Delanna JINNY Lesches, OTR/L 08/13/2024, 12:48 PM

## 2024-08-13 NOTE — Progress Notes (Signed)
   08/13/24 2302  BiPAP/CPAP/SIPAP  BiPAP/CPAP/SIPAP Pt Type Adult  Reason BIPAP/CPAP not in use Non-compliant (Pt placed on cpap, pt stated he couldnt tolerate machine.)  BiPAP/CPAP /SiPAP Vitals  Pulse Rate 82  Resp 11  SpO2 95 %  MEWS Score/Color  MEWS Score 1  MEWS Score Color Green

## 2024-08-13 NOTE — Op Note (Signed)
 [name] male 68 y.o. 08/13/2024  PreOperative Diagnosis: Right trimalleolar ankle fracture Syndesmosis disruption  PostOperative Diagnosis: same  PROCEDURE: Open treatment right trimalleolar ankle fracture without posterior fixation Open treatment syndesmosis Ankle stress view fluoroscopy  SURGEON: Lonni Pae, MD  ASSISTANT: Jesse [place], PA-C: His assistance was necessary for prep and drape, exposure, holding retractors, treatment of fracture, wound closure and splinting.   ANESTHESIA: General With peripheral nerve block  FINDINGS: See below  IMPLANTS: Arthrex recon plate with partially-threaded cannulated screws  INDICATIONS:68 y.o. male sustained the above injury after a fall when he got dizzy.  He was admitted to the hospitalist team due to failure to comply with weightbearing restrictions and pain control.  He was indicated for surgery due to the instability pattern of his ankle and syndesmosis.   Patient understood the risks, benefits and alternatives to surgery which include but are not limited to wound healing complications, infection, nonunion, malunion, need for further surgery as well as damage to surrounding structures. They also understood the potential for continued pain in that there were no guarantees of acceptable outcome After weighing these risks the patient opted to proceed with surgery.  PROCEDURE: Patient was identified in the preoperative holding area.  The right leg was marked by myself.  Consent was signed by myself and the patient.  Block was performed by anesthesia in the preoperative holding area.  Patient was taken to the operative suite and placed supine on the operative table.  General LMA anesthesia was induced without difficulty. Bump was placed under the operative hip and bone foam was used.  All bony prominences were well padded.  Tourniquet was placed on the operative thigh.  Preoperative antibiotics were given. The extremity was  prepped and draped in the usual sterile fashion and surgical timeout was performed.  The limb was elevated and the tourniquet was inflated to 250 mmHg.  We began by making a longitudinal incision overlying the fibula.  This was taken sharply down through skin and subcutaneous tissue.  Blunt dissection was used to identify any branch of the superficial peroneal nerve that was was not identified within the surgical field.  The incision was then taken sharply down to bone and the fracture site was identified.  The fracture site was mobilized.   The fracture site were cleaned with a rondure and curette of any fracture hematoma and callus formation.  Then the fracture of the fibula was reduced under direct visualization and held provisionally with a lobster claw.  Then fluoroscopy confirmed adequate reduction of the ankle mortise at that time.  Then a recon plate was placed across the fracture site for fixation.  This provided good stability of the distal fibula fracture.    We then turned our attention to the medial malleolus.  There was continued displacement of the medial malleolus and therefore an incision was made overlying this.  This was taken sharply down through skin and subcutaneous tissue.  Bovie cautery was used for skin bleeders.  Then sharp dissection down to the fracture and the fracture site was identified.  The soft tissue flap was carried anteriorly to identify the medial gutter of the ankle joint.  Then the fracture site was mobilized and using a curette and rondure the fracture was cleared out of hematoma and fracture callus.  Then the fracture was reduced and held provisionally with a pointed reduction forcep.  This was done under direct visualization.  Then fluoroscopy confirmed adequate reduction.  2 4.0 mm partially-threaded cannulated screws  were placed across the fracture site with good fixation.    We then proceeded with stress view fluoroscopy of the ankle.  It was noted on stress views  that there is widening of the syndesmosis and medial clear space.  We then proceeded with open treatment of the syndesmosis.  Separate deep incision was created distally along the level of the syndesmosis.  We would gain access to the syndesmosis and the ligaments had been torn.  There was some bony avulsion from the fibula on the inside sure at the syndesmosis ligaments.  These were reduced and the syndesmosis was in cleared of any interposed tissue using a rongeur and a Therapist, nutritional.  Then the syndesmosis was reduced under direct position and held provisionally with a Weber clamp.  Fluoroscopy confirmed appropriate position of the syndesmosis and closing down the mortise.  The patient was diabetic and therefore we decided to use screw fixation for the syndesmosis.  2 fully threaded screws were placed across syndesmosis to stabilize it.  These were placed through the plate.  Postplacement stress views were stable.  Then lateral x-rays were obtained in the posterior malleolar fragment appeared to be well reduced but was was not large enough for internal fixation and therefore decision was made to not stabilize the syndesmosis with internal fixation.  It was reduced and not amenable to internal fixation.    Final films were obtained.  The wounds were irrigated and the subcuticular tissue was closed with 3-0 Monocryl and the skin with staples.  Xeroform was placed on the wounds as well as 4 x 4's and sterile sheet cotton.  The tourniquet was released.    Patient was placed in a nonweightbearing short leg splint.  Patient tolerated the procedure well.  There were no complications.  Patient was awakened from anesthesia and taken recovery in stable condition.  POST OPERATIVE INSTRUCTIONS: Nonweightbearing on operative extremity Keep splint dry and limb elevated Continue 325 mg aspirin  for DVT prophylaxis Call the office with concerns Follow-up in 2 weeks for splint removal, x-rays of the operative  ankle, nonweightbearing and suture removal if appropriate.     TOURNIQUET TIME: Less than 2 hours  BLOOD LOSS:  Minimal         DRAINS: none         SPECIMEN: none       COMPLICATIONS:  * No complications entered in OR log *         Disposition: PACU - hemodynamically stable.         Condition: stable

## 2024-08-13 NOTE — Progress Notes (Signed)
 [name] PROGRESS NOTE   [name] FMW:[id number] DOB: 06/24/1956 DOA: 08/11/2024  PCP: [name], MD  Brief History: 68 y.o. male with medical history significant of anxiety, depression, schizoaffective disorder, bowel obstruction, chronic anemia, colon polyps, constipation, GERD, type 2 diabetes, hyperlipidemia, hypertension, memory loss, MRSA carrier, history of pneumonia, seizure disorder, skin cancer who was brought to the emergency department after he had a fall.  Per patient, he stood up too fast, felt dizzy and then fell down.  He hit his head, but stated not very hard.  He did not lose consciousness.  Imaging studies revealed right ankle fracture.  He was hospitalized for further management.    Consultants: Orthopedics  Procedures: None yet    Subjective/Interval History: Patient informed me that the plan is for surgery today.  Though no notes from orthopedic seen in the chart.  Patient denies any pain this morning.   Assessment/Plan:  Right ankle fracture Management per orthopedics.  Pain control.  DVT prophylaxis to be considered after surgery. Surgical plan is unclear at this time.  Patient is under the impression that he will be operated on today though I do not see any notes from orthopedics.  Hyponatremia Likely due to mild hypovolemia.  Has improved with IV hydration.  Grade 1 diastolic dysfunction/history of mitral regurgitation Stable from a cardiac standpoint.  Echocardiogram shows LVEF of 50 to 55%.  Grade 1 diastolic dysfunction noted.  No significant valvular abnormalities.  Essential hypertension Noted to be on metoprolol .  Blood pressure is reasonably well-controlled.  Hyperlipidemia Continue statin.  Seizure disorder Continue lacosamide .  Chronic constipation Continue bowel regimen.  Diabetes mellitus type 2 Noted to be on metformin  which is being continued.  SSI.  Monitor CBGs. HbA1c is 6.9.  Sleep apnea CPAP  Class  I obesity Estimated body mass index is 32.49 kg/m as calculated from the following:   Height as of this encounter: 5' 9 (1.753 m).   Weight as of this encounter: 99.8 kg.   DVT Prophylaxis: Definitive prophylaxis once timing of surgery is known Code Status: Full code Family Communication: Discussed with patient Disposition Plan: To be determined     Medications: Scheduled:  amitriptyline   200 mg Oral QHS   atorvastatin   40 mg Oral QHS   insulin  aspart  0-15 Units Subcutaneous TID WC   lacosamide   150 mg Oral BID   metFORMIN   1,000 mg Oral Q breakfast   metoprolol  succinate  100 mg Oral Daily   polyethylene glycol  17 g Oral Daily   senna-docusate  1 tablet Oral BID   Continuous: PRN:acetaminophen  **OR** acetaminophen , ondansetron  **OR** ondansetron  (ZOFRAN ) IV, oxyCODONE    Objective:  Vital Signs  Vitals:   08/12/24 0627 08/12/24 1420 08/12/24 2109 08/13/24 0453  BP: (!) 116/59 115/73 128/68 119/69  Pulse: 65 66 72 71  Resp: 14 18 18 18   Temp: (!) 97.5 F (36.4 C) 98.6 F (37 C) 98.3 F (36.8 C) 97.8 F (36.6 C)  TempSrc: Oral Oral Oral Oral  SpO2: 95% 95% 94% 95%  Weight:      Height:        Intake/Output Summary (Last 24 hours) at 08/13/2024 0930 Last data filed at 08/13/2024 0820 Gross per 24 hour  Intake 240 ml  Output 1750 ml  Net -1510 ml   Filed Weights   08/11/24 0857 08/11/24 1259  Weight: 99.8 kg 99.8 kg    General appearance: Awake alert.  In no distress Resp: Clear to auscultation  bilaterally.  Normal effort Cardio: S1-S2 is normal regular.  No S3-S4.  No rubs murmurs or bruit GI: Abdomen is soft.  Nontender nondistended.  Bowel sounds are present normal.  No masses organomegaly Extremities: Right leg is covered in cast  Lab Results:  Data Reviewed: I have personally reviewed following labs and reports of the imaging studies  CBC: Recent Labs  Lab 08/11/24 0909 08/12/24 0656  WBC 9.7 9.2  NEUTROABS 7.6  --   HGB 13.3 14.3  HCT  40.0 41.4  MCV 93.9 94.3  PLT 194 191    Basic Metabolic Panel: Recent Labs  Lab 08/11/24 0909 08/12/24 0656  NA 133* 136  K 4.3 4.1  CL 102 101  CO2 21* 22  GLUCOSE 136* 135*  BUN 24* 21  CREATININE 1.06 1.01  CALCIUM  8.9 9.2    GFR: Estimated Creatinine Clearance: 79.2 mL/min (by C-G formula based on SCr of 1.01 mg/dL).  Liver Function Tests: Recent Labs  Lab 08/12/24 0656  AST 31  ALT 46*  ALKPHOS 101  BILITOT 0.7  PROT 6.8  ALBUMIN  4.2    HbA1C: Recent Labs    08/11/24 1748  HGBA1C 6.9*    CBG: Recent Labs  Lab 08/12/24 0754 08/12/24 1125 08/12/24 1634 08/12/24 2110 08/13/24 0750  GLUCAP 145* 186* 113* 164* 133*     Radiology Studies: ECHOCARDIOGRAM COMPLETE Result Date: 08/12/2024    ECHOCARDIOGRAM REPORT   Patient Name:   [name] Date of Exam: 08/12/2024 Medical Rec #:  [id number]    Height:       69.0 in Accession #:    [id number]   Weight:       220.0 lb Date of Birth:  Sep 11, 1956    BSA:          2.151 m Patient Age:    68 years     BP:           116/59 mmHg Patient Gender: M            HR:           64 bpm. Exam Location:  Inpatient Procedure: 2D Echo, Cardiac Doppler and Color Doppler (Both Spectral and Color            Flow Doppler were utilized during procedure). Indications:    Near syncope  History:        Patient has no prior history of Echocardiogram examinations.                 Risk Factors:Hypertension and Diabetes.  Sonographer:    Jayson Gaskins Referring Phys: [id number] DAVID MANUEL ORTIZ IMPRESSIONS  1. Left ventricular ejection fraction, by estimation, is 50%. The left ventricle has low normal function. Left ventricular endocardial border not optimally defined to evaluate regional wall motion. Consider repeat study with contrast. Left ventricular diastolic parameters are consistent with Grade I diastolic dysfunction (impaired relaxation).  2. Right ventricular systolic function is low normal. The right ventricular size is normal.  3. The  mitral valve is normal in structure. Trivial mitral valve regurgitation. No evidence of mitral stenosis.  4. The aortic valve is normal in structure. Aortic valve regurgitation is not visualized. No aortic stenosis is present.  5. The inferior vena cava is normal in size with greater than 50% respiratory variability, suggesting right atrial pressure of 3 mmHg. FINDINGS  Left Ventricle: Left ventricular ejection fraction, by estimation, is 50 to 55%. The left ventricle has low normal function. Left ventricular endocardial  border not optimally defined to evaluate regional wall motion. The left ventricular internal cavity  size was normal in size. There is no left ventricular hypertrophy. Left ventricular diastolic parameters are consistent with Grade I diastolic dysfunction (impaired relaxation). Right Ventricle: The right ventricular size is normal. No increase in right ventricular wall thickness. Right ventricular systolic function is low normal. Left Atrium: Left atrial size was normal in size. Right Atrium: Right atrial size was normal in size. Pericardium: There is no evidence of pericardial effusion. Mitral Valve: The mitral valve is normal in structure. Trivial mitral valve regurgitation. No evidence of mitral valve stenosis. Tricuspid Valve: The tricuspid valve is normal in structure. Tricuspid valve regurgitation is not demonstrated. No evidence of tricuspid stenosis. Aortic Valve: The aortic valve is normal in structure. Aortic valve regurgitation is not visualized. No aortic stenosis is present. Aortic valve mean gradient measures 2.0 mmHg. Aortic valve peak gradient measures 3.7 mmHg. Aortic valve area, by VTI measures 2.30 cm. Pulmonic Valve: The pulmonic valve was grossly normal. Pulmonic valve regurgitation is not visualized. No evidence of pulmonic stenosis. Aorta: The aortic root is normal in size and structure. Venous: The inferior vena cava is normal in size with greater than 50% respiratory  variability, suggesting right atrial pressure of 3 mmHg. IAS/Shunts: No atrial level shunt detected by color flow Doppler.  LEFT VENTRICLE PLAX 2D LVIDd:         4.55 cm   Diastology LVIDs:         3.20 cm   LV e' medial:    6.96 cm/s LV PW:         0.90 cm   LV E/e' medial:  10.0 LV IVS:        0.95 cm   LV e' lateral:   9.90 cm/s LVOT diam:     1.90 cm   LV E/e' lateral: 7.0 LV SV:         46 LV SV Index:   21 LVOT Area:     2.84 cm  RIGHT VENTRICLE RV S prime:     8.05 cm/s TAPSE (M-mode): 1.9 cm LEFT ATRIUM           Index        RIGHT ATRIUM          Index LA Vol (A2C): 44.6 ml 20.73 ml/m  RA Area:     9.86 cm LA Vol (A4C): 57.3 ml 26.63 ml/m  RA Volume:   18.40 ml 8.55 ml/m  AORTIC VALVE AV Area (Vmax):    2.18 cm AV Area (Vmean):   2.25 cm AV Area (VTI):     2.30 cm AV Vmax:           96.10 cm/s AV Vmean:          70.700 cm/s AV VTI:            0.201 m AV Peak Grad:      3.7 mmHg AV Mean Grad:      2.0 mmHg LVOT Vmax:         73.80 cm/s LVOT Vmean:        56.100 cm/s LVOT VTI:          0.163 m LVOT/AV VTI ratio: 0.81  AORTA Ao Root diam: 3.10 cm MITRAL VALVE MV Area (PHT): 3.15 cm    SHUNTS MV Decel Time: 241 msec    Systemic VTI:  0.16 m MV E velocity: 69.40 cm/s  Systemic Diam: 1.90 cm MV A  velocity: 77.10 cm/s MV E/A ratio:  0.90 Aditya Sabharwal Electronically signed by Ria Commander Signature Date/Time: 08/12/2024/7:02:07 PM    Final    DG Chest Port 1 View Result Date: 08/11/2024 CLINICAL DATA:  Preop cardiovascular exam. EXAM: PORTABLE CHEST 1 VIEW COMPARISON:  July 18, 2024. FINDINGS: The heart size and mediastinal contours are within normal limits. Both lungs are clear. Status post left shoulder arthroplasty. IMPRESSION: No active disease. Electronically Signed   By: Lynwood Landy Raddle M.D.   On: 08/11/2024 14:06   DG Ankle Complete Right Result Date: 08/11/2024 CLINICAL DATA:  Fall. EXAM: RIGHT ANKLE - COMPLETE 3+ VIEW COMPARISON:  None Available. FINDINGS: There is mildly displaced  fracture of the lower fourth shaft of the right fibula. There is also displaced fracture of the medial malleolus with resultant disruption of ankle mortise. No other acute fracture or dislocation. No aggressive osseous lesion. Mild diffuse degenerative osteoarthritic changes of imaged joints. Calcaneal spur noted along the Achilles tendon attachment site. No focal soft tissue swelling. No radiopaque foreign bodies. IMPRESSION: *Mildly displaced fracture of the lower fourth shaft of the right fibula. Displaced fracture of the medial malleolus with resultant disruption of ankle mortise. Electronically Signed   By: Ree Molt M.D.   On: 08/11/2024 09:49       LOS: 2 days   Adama Ferber Verdene  [name] Pager on [email or web address]  08/13/2024, 9:30 AM

## 2024-08-13 NOTE — Progress Notes (Signed)
   08/13/24 1103  PT Visit Information  Reason Eval/Treat Not Completed Patient not medically ready  History of Present Illness [name] is a 68 y.o. male admitted 08/11/24 with dx R ankle fx with medical history significant of anxiety, depression, schizoaffective disorder, bowel obstruction, chronic anemia, colon polyps, constipation, GERD, type 2 diabetes, hyperlipidemia, hypertension, memory loss, MRSA carrier, history of pneumonia, seizure disorder, skin cancer who was brought to the emergency department after he had a fall.   Pt scheduled for surgical intervention to R ankle today, will follow up post operatively.  Stann, PT Acute Rehabilitation Services Office: [phone] 08/13/2024

## 2024-08-13 NOTE — Anesthesia Procedure Notes (Signed)
 Anesthesia Regional Block: Adductor canal block   Pre-Anesthetic Checklist: , timeout performed,  Correct Patient, Correct Site, Correct Laterality,  Correct Procedure,, site marked,  Risks and benefits discussed,  Surgical consent,  Pre-op evaluation,  At surgeon's request and post-op pain management  Laterality: Right  Prep: chloraprep       Needles:  Injection technique: Single-shot  Needle Type: Echogenic Stimulator Needle     Needle Length: 10cm  Needle Gauge: 20     Additional Needles:   Procedures:,,,, ultrasound used (permanent image in chart),,    Narrative:  Start time: 08/13/2024 2:30 PM End time: 08/13/2024 2:40 PM Injection made incrementally with aspirations every 5 mL.  Performed by: Personally  Anesthesiologist: Patrisha Bernardino SQUIBB, MD  Additional Notes: Functioning IV was confirmed and monitors were applied. A time-out was performed. Hand hygiene and sterile gloves were used. The thigh was placed in a frog-leg position and prepped in a sterile fashion. A 20ga Bbraun echogenic stimulator needle was placed using ultrasound guidance.  Negative aspiration and negative test dose prior to incremental administration of local anesthetic. The patient tolerated the procedure well.

## 2024-08-14 ENCOUNTER — Other Ambulatory Visit (HOSPITAL_COMMUNITY): Payer: Self-pay | Admitting: Psychiatry

## 2024-08-14 ENCOUNTER — Inpatient Hospital Stay (HOSPITAL_COMMUNITY): Payer: MEDICAID

## 2024-08-14 ENCOUNTER — Encounter (HOSPITAL_COMMUNITY): Payer: Self-pay | Admitting: Orthopaedic Surgery

## 2024-08-14 DIAGNOSIS — J9601 Acute respiratory failure with hypoxia: Secondary | ICD-10-CM

## 2024-08-14 DIAGNOSIS — J690 Pneumonitis due to inhalation of food and vomit: Secondary | ICD-10-CM

## 2024-08-14 DIAGNOSIS — F411 Generalized anxiety disorder: Secondary | ICD-10-CM

## 2024-08-14 LAB — CBC
HCT: 37.4 % — ABNORMAL LOW (ref 39.0–52.0)
Hemoglobin: 12.8 g/dL — ABNORMAL LOW (ref 13.0–17.0)
MCH: 31.5 pg (ref 26.0–34.0)
MCHC: 34.2 g/dL (ref 30.0–36.0)
MCV: 92.1 fL (ref 80.0–100.0)
Platelets: 186 K/uL (ref 150–400)
RBC: 4.06 MIL/uL — ABNORMAL LOW (ref 4.22–5.81)
RDW: 12.9 % (ref 11.5–15.5)
WBC: 10.8 K/uL — ABNORMAL HIGH (ref 4.0–10.5)
nRBC: 0 % (ref 0.0–0.2)

## 2024-08-14 LAB — COMPREHENSIVE METABOLIC PANEL WITH GFR
ALT: 35 U/L (ref 0–44)
AST: 31 U/L (ref 15–41)
Albumin: 3.7 g/dL (ref 3.5–5.0)
Alkaline Phosphatase: 84 U/L (ref 38–126)
Anion gap: 16 — ABNORMAL HIGH (ref 5–15)
BUN: 13 mg/dL (ref 8–23)
CO2: 18 mmol/L — ABNORMAL LOW (ref 22–32)
Calcium: 9.2 mg/dL (ref 8.9–10.3)
Chloride: 101 mmol/L (ref 98–111)
Creatinine, Ser: 0.96 mg/dL (ref 0.61–1.24)
GFR, Estimated: >60 mL/min (ref >60)
Glucose, Bld: 131 mg/dL — ABNORMAL HIGH (ref 70–99)
Potassium: 4.3 mmol/L (ref 3.5–5.1)
Sodium: 136 mmol/L (ref 135–145)
Total Bilirubin: 0.5 mg/dL (ref 0.0–1.2)
Total Protein: 6.4 g/dL — ABNORMAL LOW (ref 6.5–8.1)

## 2024-08-14 LAB — GLUCOSE, CAPILLARY
Glucose-Capillary: 134 mg/dL — ABNORMAL HIGH (ref 70–99)
Glucose-Capillary: 156 mg/dL — ABNORMAL HIGH (ref 70–99)
Glucose-Capillary: 127 mg/dL — ABNORMAL HIGH (ref 70–99)

## 2024-08-14 LAB — MAGNESIUM: Magnesium: 2.4 mg/dL (ref 1.7–2.4)

## 2024-08-14 LAB — PROCALCITONIN: Procalcitonin: <0.1 ng/mL

## 2024-08-14 MED ORDER — ENOXAPARIN SODIUM 60 MG/0.6ML IJ SOSY
50.0000 mg | PREFILLED_SYRINGE | INTRAMUSCULAR | Status: DC
  Administered 2024-08-15 – 2024-08-31 (×17): 50 mg via SUBCUTANEOUS
  Filled 2024-08-14 (×17): qty 0.6

## 2024-08-14 NOTE — Plan of Care (Signed)
 Able to wean oxygen down to 1L [place], still desaturates into upper 80s without it.

## 2024-08-14 NOTE — Plan of Care (Signed)
  Problem: Education: Goal: Knowledge of General Education information will improve Description: Including pain rating scale, medication(s)/side effects and non-pharmacologic comfort measures Outcome: Progressing   Problem: Health Behavior/Discharge Planning: Goal: Ability to manage health-related needs will improve Outcome: Progressing   Problem: Clinical Measurements: Goal: Ability to maintain clinical measurements within normal limits will improve Outcome: Progressing Goal: Will remain free from infection Outcome: Progressing Goal: Diagnostic test results will improve Outcome: Progressing Goal: Respiratory complications will improve Outcome: Progressing Goal: Cardiovascular complication will be avoided Outcome: Progressing   Problem: Activity: Goal: Risk for activity intolerance will decrease Outcome: Progressing   Problem: Nutrition: Goal: Adequate nutrition will be maintained Outcome: Progressing   Problem: Coping: Goal: Level of anxiety will decrease Outcome: Progressing   Problem: Elimination: Goal: Will not experience complications related to bowel motility Outcome: Progressing Goal: Will not experience complications related to urinary retention Outcome: Progressing   Problem: Pain Managment: Goal: General experience of comfort will improve and/or be controlled Outcome: Progressing   Problem: Safety: Goal: Ability to remain free from injury will improve Outcome: Progressing   Problem: Skin Integrity: Goal: Risk for impaired skin integrity will decrease Outcome: Progressing   Problem: Coping: Goal: Ability to adjust to condition or change in health will improve Outcome: Progressing   Problem: Fluid Volume: Goal: Ability to maintain a balanced intake and output will improve Outcome: Progressing   Problem: Metabolic: Goal: Ability to maintain appropriate glucose levels will improve Outcome: Progressing   Problem: Nutritional: Goal: Maintenance of  adequate nutrition will improve Outcome: Progressing   Problem: Skin Integrity: Goal: Risk for impaired skin integrity will decrease Outcome: Progressing   Problem: Tissue Perfusion: Goal: Adequacy of tissue perfusion will improve Outcome: Progressing   Cindy S. Loreli BSN, RN, [employer], CCRN 08/14/2024 5:17 AM

## 2024-08-14 NOTE — Progress Notes (Addendum)
 [name] PROGRESS NOTE   [name] FMW:[id number] DOB: [birth year] DOA: 08/11/2024  PCP: [name], MD  Brief History: 68 y.o. male with medical history significant of anxiety, depression, schizoaffective disorder, bowel obstruction, chronic anemia, colon polyps, constipation, GERD, type 2 diabetes, hyperlipidemia, hypertension, memory loss, MRSA carrier, history of pneumonia, seizure disorder, skin cancer who was brought to the emergency department after he had a fall.  Per patient, he stood up too fast, felt dizzy and then fell down.  He hit his head, but stated not very hard.  He did not lose consciousness.  Imaging studies revealed right ankle fracture.  He was hospitalized for further management.    Consultants: Orthopedics  Procedures: ORIF right ankle fracture    Subjective/Interval History: Patient feels well.  Denies any shortness of breath this morning.  Denies any chest pain.  No nausea or vomiting.     Assessment/Plan:  Acute respiratory failure with hypoxia/aspiration pneumonia Postoperatively the patient could not be weaned off of oxygen.  Chest x-ray suggested left lung infiltrate.  Is quite possible patient may have aspirated.  Recently done echocardiogram showed normal LVEF with grade 1 diastolic dysfunction. Patient was started on Unasyn  for possible aspiration.  Was also given 20 mg of furosemide  with which he did diurese.  Oxygenation seems to be better today. Continue to wean down oxygen.  Chest x-ray from this morning shows some improvement in the left-sided infiltrate. Procalcitonin level less than 0.1. Continue current treatment for now.  Continue stepdown monitoring for 24 hours.  Right ankle fracture Patient seen by orthopedics and underwent surgery on 8/28.  Pain seems to be reasonably well-controlled.  PT OT eval.  Hyponatremia Likely due to mild hypovolemia.  Has improved with IV hydration.  Grade 1 diastolic dysfunction/history  of mitral regurgitation Stable from a cardiac standpoint.  Echocardiogram shows LVEF of 50 to 55%.  Grade 1 diastolic dysfunction noted.  No significant valvular abnormalities. See above regarding respiratory failure.  He did get a dose of furosemide  yesterday.  Essential hypertension Soft blood pressures noted overnight.  Will hold metoprolol  for the day.  Continue to monitor.    Normocytic anemia Mild drop in hemoglobin is most likely dilutional.  Continue to monitor daily for now.  Hyperlipidemia Continue statin.  Seizure disorder Continue lacosamide .  Chronic constipation Continue bowel regimen.  Diabetes mellitus type 2 Noted to be on metformin  which is being continued.  SSI.  Monitor CBGs. HbA1c is 6.9.  Sleep apnea CPAP  Class I obesity Estimated body mass index is 32.82 kg/m as calculated from the following:   Height as of this encounter: 5' 9 (1.753 m).   Weight as of this encounter: 100.8 kg.   DVT Prophylaxis: Lovenox  Code Status: Full code Family Communication: Discussed with patient Disposition Plan: To be determined     Medications: Scheduled:  amitriptyline   200 mg Oral QHS   atorvastatin   40 mg Oral QHS   Chlorhexidine  Gluconate Cloth  6 each Topical Daily   enoxaparin  (LOVENOX ) injection  40 mg Subcutaneous Q24H   insulin  aspart  0-15 Units Subcutaneous TID WC   lacosamide   150 mg Oral BID   melatonin  3 mg Oral QHS   metFORMIN   1,000 mg Oral Q breakfast   polyethylene glycol  17 g Oral Daily   senna-docusate  1 tablet Oral BID   Continuous:  ampicillin -sulbactam (UNASYN ) IV 3 g (08/14/24 0805)   PRN:acetaminophen  **OR** acetaminophen , hydrOXYzine , ipratropium-albuterol , ondansetron  **OR** ondansetron  (ZOFRAN ) IV,  mouth rinse, oxyCODONE    Objective:  Vital Signs  Vitals:   08/14/24 0500 08/14/24 0600 08/14/24 0700 08/14/24 0800  BP: 104/62 (!) 99/49 118/84 (!) 132/53  Pulse: 64 63 72 77  Resp:    (!) 22  Temp:      TempSrc:       SpO2: 92% 93% 98% 96%  Weight:      Height:        Intake/Output Summary (Last 24 hours) at 08/14/2024 0851 Last data filed at 08/14/2024 0400 Gross per 24 hour  Intake 1553.45 ml  Output 2225 ml  Net -671.55 ml   Filed Weights   08/11/24 0857 08/11/24 1259 08/13/24 1843  Weight: 99.8 kg 99.8 kg 100.8 kg    General appearance: Awake alert.  In no distress Resp: Breath sounds bilaterally with few crackles on the left.  No wheezing or rhonchi appreciated. Cardio: S1-S2 is normal regular.  No S3-S4.  No rubs murmurs or bruit GI: Abdomen is soft.  Nontender nondistended.  Bowel sounds are present normal.  No masses organomegaly Extremities: Right lower extremity is in a dressing.  Lab Results:  Data Reviewed: I have personally reviewed following labs and reports of the imaging studies  CBC: Recent Labs  Lab 08/11/24 0909 08/12/24 0656 08/13/24 2122 08/14/24 0310  WBC 9.7 9.2 12.1* 10.8*  NEUTROABS 7.6  --   --   --   HGB 13.3 14.3 13.9 12.8*  HCT 40.0 41.4 39.4 37.4*  MCV 93.9 94.3 90.6 92.1  PLT 194 191 186 186    Basic Metabolic Panel: Recent Labs  Lab 08/11/24 0909 08/12/24 0656 08/14/24 0310  NA 133* 136 136  K 4.3 4.1 4.3  CL 102 101 101  CO2 21* 22 18*  GLUCOSE 136* 135* 131*  BUN 24* 21 13  CREATININE 1.06 1.01 0.96  CALCIUM  8.9 9.2 9.2  MG  --   --  2.4    GFR: Estimated Creatinine Clearance: 83.8 mL/min (by C-G formula based on SCr of 0.96 mg/dL).  Liver Function Tests: Recent Labs  Lab 08/12/24 0656 08/14/24 0310  AST 31 31  ALT 46* 35  ALKPHOS 101 84  BILITOT 0.7 0.5  PROT 6.8 6.4*  ALBUMIN  4.2 3.7    HbA1C: Recent Labs    08/11/24 1748  HGBA1C 6.9*    CBG: Recent Labs  Lab 08/13/24 1118 08/13/24 1333 08/13/24 1647 08/13/24 2212 08/14/24 0741  GLUCAP 227* 119* 158* 229* 134*     Radiology Studies: DG Chest Port 1 View Result Date: 08/13/2024 CLINICAL DATA:  Shortness of breath. EXAM: PORTABLE CHEST 1 VIEW  COMPARISON:  Chest radiograph dated 08/11/2024. FINDINGS: Diffuse interstitial densities in the left lung, new since the prior radiograph and concerning for atypical infiltrate. Clinical correlation and follow-up recommended. No pleural effusion or pneumothorax. The cardiac silhouette is within normal limits. Partially visualized left shoulder arthroplasty. No acute osseous pathology. IMPRESSION: Findings concerning for atypical infiltrate in the left lung. Electronically Signed   By: Vanetta Chou M.D.   On: 08/13/2024 18:32   DG Ankle 2 Views Right Result Date: 08/13/2024 CLINICAL DATA:  Internal fixation of the right ankle. EXAM: RIGHT ANKLE - 2 VIEW COMPARISON:  Right ankle radiograph dated 08/11/2024. FINDINGS: Four intraoperative fluoroscopic spot images provided. The total fluoroscopic time is 15 seconds with a cumulative air Karma of 0.4362 mGy. Internal fixation of the distal fibula and medial malleolar fracture. IMPRESSION: Intraoperative fluoroscopic spot images of the right ankle. Electronically Signed  By: Vanetta Chou M.D.   On: 08/13/2024 16:15   DG C-Arm 1-60 Min-No Report Result Date: 08/13/2024 Fluoroscopy was utilized by the requesting physician.  No radiographic interpretation.       LOS: 3 days   Ellionna Buckbee [employer] on [email or web address]  08/14/2024, 8:51 AM

## 2024-08-14 NOTE — Evaluation (Signed)
 Physical Therapy Evaluation Patient Details Name: [name] MRN: [id number] DOB: 07/27/1956 Today's Date: 08/14/2024  History of Present Illness  [name] is a 68 y.o. male admitted 08/11/24 with dx R ankle fx with medical history significant of anxiety, depression, schizoaffective disorder, bowel obstruction, chronic anemia, colon polyps, constipation, GERD, type 2 diabetes, hyperlipidemia, hypertension, memory loss, MRSA carrier, history of pneumonia, seizure disorder, skin cancer who was brought to the emergency department after he had a fall.  Clinical Impression   [name] is a 68 y.o. male POD 0 s/p R ankle ORIF. Patient reports IND with mobility at baseline. Patient is now limited by functional impairments (see PT problem list below) and requires min A for supine to sit and CGA for sit to supine for bed mobility and unable to progress with transfers or gait tasks due to pain. Pt reports chest pain since fall and pain increases with cough, fluctuance and urination, pt reports ongoing R LE pain. Pt on 1 L/min and maintaining O2 saturation 88-96% however on room air pt desaturated in the lower to mid 80s. Pt reported dizziness when seated EOB and unable to obtain accurate Bp [name] due to pt constant L UE movement. Pt returned to supine, nursing present and aware of pain report and provided pt with pain medication as well as O2 desaturation.  Patient will benefit from continued inpatient follow up therapy, <3 hours/day. Patient will benefit from continued skilled PT interventions to address impairments and progress towards PLOF. Acute PT will follow to progress mobility and stair training in preparation for safe discharge home.       If plan is discharge home, recommend the following: Two people to help with walking and/or transfers;A lot of help with bathing/dressing/bathroom;Assistance with cooking/housework;Assist for transportation;Help with stairs or ramp for entrance   Can travel  by private vehicle   No    Equipment Recommendations Other (comment) (deferr to next venue)  Recommendations for Other Services       Functional Status Assessment Patient has had a recent decline in their functional status and demonstrates the ability to make significant improvements in function in a reasonable and predictable amount of time.     Precautions / Restrictions Precautions Precautions: Fall Restrictions Weight Bearing Restrictions Per Provider Order: Yes RLE Weight Bearing Per Provider Order: Non weight bearing      Mobility  Bed Mobility Overal bed mobility: Needs Assistance Bed Mobility: Supine to Sit, Sit to Supine     Supine to sit: Min assist, HOB elevated, Used rails Sit to supine: Contact guard assist   General bed mobility comments: pt required increased time cues and encouragement for supine to sit with use of hospital bed, CGA and  cues with R LE management for scooting laterally on EOB to R, CGA and increased time for sit to supine to flat surface    Transfers                   General transfer comment: NT    Ambulation/Gait               General Gait Details: NT  Stairs            Wheelchair Mobility     Tilt Bed    Modified Rankin (Stroke Patients Only)       Balance Overall balance assessment: History of Falls, Needs assistance Sitting-balance support: Feet supported, Bilateral upper extremity supported (L LE) Sitting balance-Leahy Scale: Fair  Pertinent Vitals/Pain Pain Assessment Pain Assessment: 0-10 Pain Score: 7  Pain Location: R distal LE, chest when coughing Pain Descriptors / Indicators: Aching, Constant, Discomfort, Grimacing, Guarding Pain Intervention(s): Monitored during session, Limited activity within patient's tolerance, Premedicated before session, Repositioned, Patient requesting pain meds-RN notified, RN gave pain meds during session     Home Living Family/patient expects to be discharged to:: Skilled nursing facility Living Arrangements: Alone                 Additional Comments: pt lives alone in a one story condo no steps to enter, pt has social support of IADLs    Prior Function Prior Level of Function : Independent/Modified Independent;History of Falls (last six months)             Mobility Comments: IND no AD for mobiltiy tasks ADLs Comments: pt reports limited motivation for ADLs due to depression     Extremity/Trunk Assessment        Lower Extremity Assessment Lower Extremity Assessment: Generalized weakness (R LE NT)    Cervical / Trunk Assessment Cervical / Trunk Assessment: Normal  Communication   Communication Communication: No apparent difficulties    Cognition Arousal: Alert Behavior During Therapy: WFL for tasks assessed/performed   PT - Cognitive impairments: No apparent impairments                         Following commands: Intact       Cueing       General Comments      Exercises     Assessment/Plan    PT Assessment Patient needs continued PT services  PT Problem List Decreased strength;Decreased range of motion;Decreased activity tolerance;Decreased balance;Decreased mobility;Decreased coordination;Decreased safety awareness;Decreased knowledge of precautions;Pain       PT Treatment Interventions DME instruction;Gait training;Functional mobility training;Therapeutic activities;Therapeutic exercise;Balance training;Neuromuscular re-education;Patient/family education    PT Goals (Current goals can be found in the Care Plan section)  Acute Rehab PT Goals Patient Stated Goal: to be able to go home PT Goal Formulation: With patient Time For Goal Achievement: 08/28/24 Potential to Achieve Goals: Fair    Frequency Min 3X/week     Co-evaluation PT/OT/SLP Co-Evaluation/Treatment: Yes Reason for Co-Treatment: Complexity of the patient's  impairments (multi-system involvement);Necessary to address cognition/behavior during functional activity;For patient/therapist safety PT goals addressed during session: Mobility/safety with mobility;Balance;Proper use of DME         AM-PAC PT 6 Clicks Mobility  Outcome Measure Help needed turning from your back to your side while in a flat bed without using bedrails?: A Little Help needed moving from lying on your back to sitting on the side of a flat bed without using bedrails?: A Little Help needed moving to and from a bed to a chair (including a wheelchair)?: Total Help needed standing up from a chair using your arms (e.g., wheelchair or bedside chair)?: Total Help needed to walk in hospital room?: Total Help needed climbing 3-5 steps with a railing? : Total 6 Click Score: 10    End of Session Equipment Utilized During Treatment: Gait belt;Oxygen Activity Tolerance: No increased pain;Patient limited by fatigue Patient left: in bed;with call bell/phone within reach;with nursing/sitter in room Nurse Communication: Mobility status;Other (comment);Patient requests pain meds;Need for lift equipment (reprots of chest and R LE pain and O2 desaturation on RA and when on 1 L/min supplemental O2) PT Visit Diagnosis: Unsteadiness on feet (R26.81);Other abnormalities of gait and mobility (R26.89);Muscle weakness (generalized) (M62.81);History of falling (  Z91.81);Difficulty in walking, not elsewhere classified (R26.2);Pain Pain - Right/Left: Right Pain - part of body: Leg;Ankle and joints of foot    Time: 8485-8456 PT Time Calculation (min) (ACUTE ONLY): 29 min   Charges:   PT Evaluation $PT Eval Low Complexity: 1 Low   PT General Charges $$ ACUTE PT VISIT: 1 Visit         [place], PT Acute Rehab   [place] VEAR Drone 08/14/2024, 3:58 PM

## 2024-08-14 NOTE — TOC Progression Note (Signed)
 Transition of Care [facility]) - Progression Note    Patient Details  Name: [name] MRN: [id number] Date of Birth: 05-14-1956  [facility]) CM/SW Contact  Hamza ONEIDA Anon, RN Phone Number: 08/14/2024, 12:38 PM  Clinical Narrative:    Pt s/p ORIF of the Rt ankle. Currently in ICU requiring O2, that is not baseline. Awaiting PT/OT consult. IP Care Management following for any new recommendations or DC needs.     Barriers to Discharge: Continued Medical Work up               Expected Discharge Plan and Services                                               Social Drivers of Health (SDOH) Interventions SDOH Screenings   Food Insecurity: Food Insecurity Present (08/11/2024)  Housing: Low Risk  (08/11/2024)  Transportation Needs: Unmet Transportation Needs (08/11/2024)  Utilities: At Risk (08/11/2024)  Alcohol Screen: Low Risk  (08/04/2018)  Depression (PHQ2-9): High Risk (07/23/2024)  Financial Resource Strain: Low Risk  (08/04/2018)  Physical Activity: Inactive (08/04/2018)  Social Connections: Unknown (08/11/2024)  Stress: No Stress Concern Present (08/04/2018)  Tobacco Use: Low Risk  (08/13/2024)    Readmission Risk Interventions     No data to display

## 2024-08-14 NOTE — Evaluation (Signed)
 Occupational Therapy Evaluation Patient Details Name: [name] MRN: [id number] DOB: 30-Mar-1956 Today's Date: 08/14/2024   History of Present Illness   [name] is a 68 yr old male admitted 08/11/24 with R ankle fracture after a fall; s/p open repair without posterior fixation on 08-13-24. PMH: anxiety, depression, schizoaffective disorder, bowel obstruction, chronic anemia, colon polyps, constipation, GERD, type 2 diabetes, hyperlipidemia, hypertension, memory loss, MRSA carrier, history of pneumonia, seizure disorder, skin cancer     Clinical Impressions Pt is currently presenting well below his baseline level of functioning for self-care management. He is limited by the below listed deficits (see OT problem list). During the session, he required min assist for supine to sit, max assist for lower body dressing, and min assist for lateral scooting along EOB. He reported having RLE pain and chest discomfort (nurse informed). He presented with increased fatigue with activity. Further OT services are warranted to maximize his independence with ADLs and to decrease the risk for restricted participation in meaningful activities. Patient will benefit from continued inpatient follow up therapy, <3 hours/day.       If plan is discharge home, recommend the following:   A lot of help with walking and/or transfers;A lot of help with bathing/dressing/bathroom;Assistance with cooking/housework;Assist for transportation     Functional Status Assessment   Patient has had a recent decline in their functional status and demonstrates the ability to make significant improvements in function in a reasonable and predictable amount of time.     Equipment Recommendations   Other (comment) (defer to next level of care)     Recommendations for Other Services         Precautions/Restrictions   Precautions Precautions: Fall Restrictions Weight Bearing Restrictions Per Provider Order:  Yes RLE Weight Bearing Per Provider Order: Non weight bearing     Mobility Bed Mobility Overal bed mobility: Needs Assistance Bed Mobility: Supine to Sit, Sit to Supine     Supine to sit: Min assist, HOB elevated, Used rails Sit to supine: Contact guard assist   General bed mobility comments: pt required increased time cues for supine to sit, CGA and  cues with R LE management for scooting laterally on EOB, CGA and increased time for sit to supine to flat surface        Balance     Sitting balance-Leahy Scale: Fair         ADL either performed or assessed with clinical judgement   ADL Overall ADL's : Needs assistance/impaired Eating/Feeding: Set up;Sitting   Grooming: Set up;Supervision/safety;Sitting           Upper Body Dressing : Contact guard assist;Sitting   Lower Body Dressing: Maximal assistance;Sitting/lateral leans       Toileting- Clothing Manipulation and Hygiene: Maximal assistance;Bed level               Vision Baseline Vision/History: 1 Wears glasses              Pertinent Vitals/Pain Pain Assessment Pain Assessment: 0-10 Pain Score: 7  Pain Location: R distal LE, chest when coughing Pain Descriptors / Indicators: Aching, Constant, Discomfort, Grimacing, Guarding Pain Intervention(s): Limited activity within patient's tolerance, Monitored during session, Repositioned, Patient requesting pain meds-RN notified     Extremity/Trunk Assessment Upper Extremity Assessment Upper Extremity Assessment: LUE deficits/detail;Left hand dominant;RUE deficits/detail RUE Deficits / Details: AROM WFL. Functional grip strength LUE Deficits / Details: chronic shoulder AROM limitations. Functional grip strength   Lower Extremity Assessment Lower Extremity Assessment: RLE  deficits/detail;LLE deficits/detail;Generalized weakness RLE Deficits / Details: ankle not assessed, due to applied splint LLE Deficits / Details: AROM WFL      Communication  Communication Communication: No apparent difficulties   Cognition Arousal: Alert Behavior During Therapy: Anxious               OT - Cognition Comments: Oriented x4                 Following commands: Intact                  Home Living Family/patient expects to be discharged to:: Skilled nursing facility Living Arrangements: Alone Available Help at Discharge: Friend(s);Available PRN/intermittently Type of Home: Other(Comment) (Condo) Home Access: Level entry     Home Layout: One level     Bathroom Shower/Tub: Tub/shower unit         Home Equipment: None   Additional Comments: pt lives alone in a one story condo no steps to enter      Prior Functioning/Environment Prior Level of Function : Independent/Modified Independent;History of Falls (last six months)             Mobility Comments:  (Independent for household ambulation. Rarely leaves his home.) ADLs Comments: Pt reports limited motivation for ADLs, due to depression. Has a friend who drives him places, he prepares microwave meals, and he reported he rarely bathes due to depression.    OT Problem List: Decreased strength;Decreased activity tolerance;Decreased knowledge of use of DME or AE;Decreased knowledge of precautions;Impaired balance (sitting and/or standing);Pain   OT Treatment/Interventions: Self-care/ADL training;Therapeutic exercise;Therapeutic activities;Energy conservation;Patient/family education;DME and/or AE instruction;Balance training      OT Goals(Current goals can be found in the care plan section)   Acute Rehab OT Goals OT Goal Formulation: With patient Time For Goal Achievement: 08/28/24 Potential to Achieve Goals: Good ADL Goals Pt Will Perform Lower Body Dressing: with contact guard assist;sitting/lateral leans;with adaptive equipment Pt Will Transfer to Toilet: with contact guard assist;squat pivot transfer;stand pivot transfer;bedside commode Pt Will Perform  Toileting - Clothing Manipulation and hygiene: with contact guard assist;sitting/lateral leans   OT Frequency:  Min 2X/week    Co-evaluation PT/OT/SLP Co-Evaluation/Treatment: Yes Reason for Co-Treatment: Complexity of the patient's impairments (multi-system involvement);For patient/therapist safety PT goals addressed during session: Mobility/safety with mobility;Balance;Proper use of DME OT goals addressed during session: ADL's and self-care      AM-PAC OT 6 Clicks Daily Activity     Outcome Measure Help from another person eating meals?: None Help from another person taking care of personal grooming?: A Little Help from another person toileting, which includes using toliet, bedpan, or urinal?: A Lot Help from another person bathing (including washing, rinsing, drying)?: A Lot Help from another person to put on and taking off regular upper body clothing?: A Little Help from another person to put on and taking off regular lower body clothing?: A Lot 6 Click Score: 16   End of Session Equipment Utilized During Treatment: Oxygen Nurse Communication: Patient requests pain meds  Activity Tolerance: Other (comment) (Fair tolerance) Patient left: in bed;with call bell/phone within reach;with bed alarm set  OT Visit Diagnosis: Other abnormalities of gait and mobility (R26.89);History of falling (Z91.81);Pain;Muscle weakness (generalized) (M62.81) Pain - Right/Left: Right Pain - part of body: Leg                Time: 8485-8456 OT Time Calculation (min): 29 min Charges:  OT General Charges $OT Visit: 1 Visit OT Evaluation $OT Eval Moderate  Complexity: 1 Mod    Delanna JINNY Lesches, OTR/L 08/14/2024, 5:42 PM

## 2024-08-15 ENCOUNTER — Inpatient Hospital Stay (HOSPITAL_COMMUNITY): Payer: Self-pay

## 2024-08-15 LAB — BASIC METABOLIC PANEL WITH GFR
Anion gap: 13 (ref 5–15)
BUN: 17 mg/dL (ref 8–23)
CO2: 23 mmol/L (ref 22–32)
Calcium: 8.7 mg/dL — ABNORMAL LOW (ref 8.9–10.3)
Chloride: 102 mmol/L (ref 98–111)
Creatinine, Ser: 0.92 mg/dL (ref 0.61–1.24)
GFR, Estimated: >60 mL/min (ref >60)
Glucose, Bld: 136 mg/dL — ABNORMAL HIGH (ref 70–99)
Potassium: 3.7 mmol/L (ref 3.5–5.1)
Sodium: 137 mmol/L (ref 135–145)

## 2024-08-15 LAB — GLUCOSE, CAPILLARY
Glucose-Capillary: 144 mg/dL — ABNORMAL HIGH (ref 70–99)
Glucose-Capillary: 165 mg/dL — ABNORMAL HIGH (ref 70–99)
Glucose-Capillary: 141 mg/dL — ABNORMAL HIGH (ref 70–99)
Glucose-Capillary: 188 mg/dL — ABNORMAL HIGH (ref 70–99)

## 2024-08-15 LAB — CBC
HCT: 35.6 % — ABNORMAL LOW (ref 39.0–52.0)
Hemoglobin: 12 g/dL — ABNORMAL LOW (ref 13.0–17.0)
MCH: 31.9 pg (ref 26.0–34.0)
MCHC: 33.7 g/dL (ref 30.0–36.0)
MCV: 94.7 fL (ref 80.0–100.0)
Platelets: 179 K/uL (ref 150–400)
RBC: 3.76 MIL/uL — ABNORMAL LOW (ref 4.22–5.81)
RDW: 13.2 % (ref 11.5–15.5)
WBC: 7.8 K/uL (ref 4.0–10.5)
nRBC: 0 % (ref 0.0–0.2)

## 2024-08-15 MED ORDER — SACCHAROMYCES BOULARDII 250 MG PO CAPS
250.0000 mg | ORAL_CAPSULE | Freq: Two times a day (BID) | ORAL | Status: DC
  Administered 2024-08-15 – 2024-08-31 (×33): 250 mg via ORAL
  Filled 2024-08-15 (×34): qty 1

## 2024-08-15 MED ORDER — LORAZEPAM 2 MG/ML IJ SOLN
0.5000 mg | Freq: Once | INTRAMUSCULAR | Status: AC
  Administered 2024-08-15: 0.5 mg via INTRAVENOUS
  Filled 2024-08-15: qty 0.25

## 2024-08-15 MED ORDER — TAMSULOSIN HCL 0.4 MG PO CAPS
0.4000 mg | ORAL_CAPSULE | Freq: Every day | ORAL | Status: DC
  Administered 2024-08-15 – 2024-08-31 (×17): 0.4 mg via ORAL
  Filled 2024-08-15 (×17): qty 1

## 2024-08-15 MED ORDER — TAMSULOSIN HCL 0.4 MG PO CAPS: 0.4000 mg | ORAL_CAPSULE | Freq: Every day | ORAL | Status: DC

## 2024-08-15 MED ORDER — METOPROLOL SUCCINATE ER 50 MG PO TB24
50.0000 mg | ORAL_TABLET | Freq: Every day | ORAL | Status: DC
  Administered 2024-08-15 – 2024-08-31 (×17): 50 mg via ORAL
  Filled 2024-08-15 (×17): qty 1

## 2024-08-15 NOTE — Progress Notes (Signed)
 Bladder scan =  I&O cath = 525 ml  Pt tolerated well, no bleeding noted, will continue to monitor

## 2024-08-15 NOTE — Progress Notes (Signed)
 [name] PROGRESS NOTE   [name] FMW:[id number] DOB: 31-Jul-1956 DOA: 08/11/2024  PCP: [name], MD  Brief History: 68 y.o. male with medical history significant of anxiety, depression, schizoaffective disorder, bowel obstruction, chronic anemia, colon polyps, constipation, GERD, type 2 diabetes, hyperlipidemia, hypertension, memory loss, MRSA carrier, history of pneumonia, seizure disorder, skin cancer who was brought to the emergency department after he had a fall.  Per patient, he stood up too fast, felt dizzy and then fell down.  He hit his head, but stated not very hard.  He did not lose consciousness.  Imaging studies revealed right ankle fracture.  He was hospitalized for further management.    Consultants: Orthopedics  Procedures: ORIF right ankle fracture    Subjective/Interval History: For the most part patient is feeling well.  Has had cough with blood-tinged sputum occasionally.  Experiencing some chest discomfort when he coughs but not otherwise.  No other complaints offered.  Pain in the right ankle is reasonably well-controlled.    Assessment/Plan:  Acute respiratory failure with hypoxia/aspiration pneumonia Postoperatively the patient could not be weaned off of oxygen.  Chest x-ray suggested left lung infiltrate.  It is quite possible patient may have aspirated.  Recently done echocardiogram showed normal LVEF with grade 1 diastolic dysfunction. Patient was started on Unasyn  for possible aspiration.  Was also given 20 mg of furosemide  with which he did diurese.   Oxygenation has improved.  Will repeat chest x-ray today.  Occasional blood-tinged sputum noted by patient.  He was reassured that it was probably related to the pneumonia. Continue antibiotics for now.    Right ankle fracture Patient seen by orthopedics and underwent surgery on 8/28.  Pain seems to be reasonably well-controlled.  PT OT eval.  Hyponatremia Likely due to mild  hypovolemia.  Has improved with IV hydration.  Grade 1 diastolic dysfunction/history of mitral regurgitation Stable from a cardiac standpoint.  Echocardiogram shows LVEF of 50 to 55%.  Grade 1 diastolic dysfunction noted.  No significant valvular abnormalities. See above regarding respiratory failure.    Essential hypertension Metoprolol  was held due to soft blood pressures.  Blood pressures appear to have rebounded.  Will resume metoprolol  at a lower dose.    Normocytic anemia Mild drop in hemoglobin is most likely dilutional.  Stability noted in the last 24 hours.  Hyperlipidemia Continue statin.  Seizure disorder Continue lacosamide .  Chronic constipation Continue bowel regimen.  Diabetes mellitus type 2 Noted to be on metformin  which is being continued.  SSI.  Monitor CBGs. HbA1c is 6.9.  Sleep apnea CPAP  Class I obesity Estimated body mass index is 32.82 kg/m as calculated from the following:   Height as of this encounter: 5' 9 (1.753 m).   Weight as of this encounter: 100.8 kg.   DVT Prophylaxis: Lovenox  Code Status: Full code Family Communication: Discussed with patient Disposition Plan: SNF when medically stable     Medications: Scheduled:  amitriptyline   200 mg Oral QHS   atorvastatin   40 mg Oral QHS   Chlorhexidine  Gluconate Cloth  6 each Topical Daily   enoxaparin  (LOVENOX ) injection  50 mg Subcutaneous Q24H   insulin  aspart  0-15 Units Subcutaneous TID WC   lacosamide   150 mg Oral BID   melatonin  3 mg Oral QHS   metFORMIN   1,000 mg Oral Q breakfast   polyethylene glycol  17 g Oral Daily   senna-docusate  1 tablet Oral BID   tamsulosin   0.4 mg Oral  QPC supper   Continuous:  ampicillin -sulbactam (UNASYN ) IV 3 g (08/15/24 0821)   PRN:acetaminophen  **OR** acetaminophen , hydrOXYzine , ipratropium-albuterol , ondansetron  **OR** ondansetron  (ZOFRAN ) IV, mouth rinse, oxyCODONE    Objective:  Vital Signs  Vitals:   08/14/24 2053 08/15/24 0117  08/15/24 0630 08/15/24 0932  BP: 123/82 125/66 (!) 142/79 132/71  Pulse: 92 93 100 97  Resp: 18 19 19 19   Temp: 97.9 F (36.6 C) 98.3 F (36.8 C) 97.8 F (36.6 C) (!) 97.4 F (36.3 C)  TempSrc: Oral Axillary  Oral  SpO2: 93% 94% 97% 97%  Weight:      Height:        Intake/Output Summary (Last 24 hours) at 08/15/2024 0955 Last data filed at 08/15/2024 0935 Gross per 24 hour  Intake 400 ml  Output 350 ml  Net 50 ml   Filed Weights   08/11/24 0857 08/11/24 1259 08/13/24 1843  Weight: 99.8 kg 99.8 kg 100.8 kg    General appearance: Awake alert.  In no distress Resp: Good air entry bilaterally without any wheezing or rhonchi.  Fewer crackles. Cardio: S1-S2 is normal regular.  No S3-S4.  No rubs murmurs or bruit GI: Abdomen is soft.  Nontender nondistended.  Bowel sounds are present normal.  No masses organomegaly Extremities: Right lower extremity is in dressing No obvious focal neurological deficits  Lab Results:  Data Reviewed: I have personally reviewed following labs and reports of the imaging studies  CBC: Recent Labs  Lab 08/11/24 0909 08/12/24 0656 08/13/24 2122 08/14/24 0310 08/15/24 0347  WBC 9.7 9.2 12.1* 10.8* 7.8  NEUTROABS 7.6  --   --   --   --   HGB 13.3 14.3 13.9 12.8* 12.0*  HCT 40.0 41.4 39.4 37.4* 35.6*  MCV 93.9 94.3 90.6 92.1 94.7  PLT 194 191 186 186 179    Basic Metabolic Panel: Recent Labs  Lab 08/11/24 0909 08/12/24 0656 08/14/24 0310 08/15/24 0347  NA 133* 136 136 137  K 4.3 4.1 4.3 3.7  CL 102 101 101 102  CO2 21* 22 18* 23  GLUCOSE 136* 135* 131* 136*  BUN 24* 21 13 17   CREATININE 1.06 1.01 0.96 0.92  CALCIUM  8.9 9.2 9.2 8.7*  MG  --   --  2.4  --     GFR: Estimated Creatinine Clearance: 87.4 mL/min (by C-G formula based on SCr of 0.92 mg/dL).  Liver Function Tests: Recent Labs  Lab 08/12/24 0656 08/14/24 0310  AST 31 31  ALT 46* 35  ALKPHOS 101 84  BILITOT 0.7 0.5  PROT 6.8 6.4*  ALBUMIN  4.2 3.7     CBG: Recent Labs  Lab 08/13/24 2212 08/14/24 0741 08/14/24 1223 08/14/24 1559 08/15/24 0741  GLUCAP 229* 134* 156* 127* 144*     Radiology Studies: DG CHEST PORT 1 VIEW Result Date: 08/14/2024 EXAM: 1 VIEW XRAY OF THE CHEST 08/14/2024 04:53:00 AM COMPARISON: 08/13/2024 CLINICAL HISTORY: [id number] Pneumonia [id number]. pneumonia FINDINGS: LUNGS AND PLEURA: Asymmetric increased interstitial opacities throughout the left upper lobe are again noted and is stable to improve in the interval. Lung volumes are very low. No pleural effusion. No pneumothorax. HEART AND MEDIASTINUM: No acute abnormality of the cardiac and mediastinal silhouettes. Asymmetric elevation of the right hemidiaphragm. BONES AND SOFT TISSUES: Previous left shoulder arthroplasty. No acute osseous abnormality. IMPRESSION: 1. Stable to improved asymmetric increased interstitial opacities throughout the left upper lobe, consistent with pneumonia. 2. Unchanged asymmetric elevation of the right hemidiaphragm. Electronically signed by: Waddell Calk MD 08/14/2024 08:50  AM EDT RP Workstation: [id number]   DG Chest Port 1 View Result Date: 08/13/2024 CLINICAL DATA:  Shortness of breath. EXAM: PORTABLE CHEST 1 VIEW COMPARISON:  Chest radiograph dated 08/11/2024. FINDINGS: Diffuse interstitial densities in the left lung, new since the prior radiograph and concerning for atypical infiltrate. Clinical correlation and follow-up recommended. No pleural effusion or pneumothorax. The cardiac silhouette is within normal limits. Partially visualized left shoulder arthroplasty. No acute osseous pathology. IMPRESSION: Findings concerning for atypical infiltrate in the left lung. Electronically Signed   By: Vanetta Chou M.D.   On: 08/13/2024 18:32   DG Ankle 2 Views Right Result Date: 08/13/2024 CLINICAL DATA:  Internal fixation of the right ankle. EXAM: RIGHT ANKLE - 2 VIEW COMPARISON:  Right ankle radiograph dated 08/11/2024. FINDINGS: Four  intraoperative fluoroscopic spot images provided. The total fluoroscopic time is 15 seconds with a cumulative air Karma of 0.4362 mGy. Internal fixation of the distal fibula and medial malleolar fracture. IMPRESSION: Intraoperative fluoroscopic spot images of the right ankle. Electronically Signed   By: Vanetta Chou M.D.   On: 08/13/2024 16:15   DG C-Arm 1-60 Min-No Report Result Date: 08/13/2024 Fluoroscopy was utilized by the requesting physician.  No radiographic interpretation.       LOS: 4 days   Ajahni Nay [employer] on [email or web address]  08/15/2024, 9:55 AM

## 2024-08-15 NOTE — Plan of Care (Signed)
 Patient compliance with care plan progressing

## 2024-08-16 DIAGNOSIS — R338 Other retention of urine: Secondary | ICD-10-CM

## 2024-08-16 LAB — URINALYSIS, ROUTINE W REFLEX MICROSCOPIC
Bacteria, UA: NONE SEEN
Bilirubin Urine: NEGATIVE
Glucose, UA: NEGATIVE mg/dL
Hgb urine dipstick: NEGATIVE
Ketones, ur: NEGATIVE mg/dL
Nitrite: NEGATIVE
Protein, ur: NEGATIVE mg/dL
Specific Gravity, Urine: 1.026 (ref 1.005–1.030)
pH: 5 (ref 5.0–8.0)

## 2024-08-16 LAB — GLUCOSE, CAPILLARY
Glucose-Capillary: 110 mg/dL — ABNORMAL HIGH (ref 70–99)
Glucose-Capillary: 195 mg/dL — ABNORMAL HIGH (ref 70–99)
Glucose-Capillary: 145 mg/dL — ABNORMAL HIGH (ref 70–99)
Glucose-Capillary: 163 mg/dL — ABNORMAL HIGH (ref 70–99)

## 2024-08-16 MED ORDER — AMOXICILLIN-POT CLAVULANATE 875-125 MG PO TABS
1.0000 | ORAL_TABLET | Freq: Two times a day (BID) | ORAL | Status: AC
  Administered 2024-08-16 – 2024-08-20 (×10): 1 via ORAL
  Filled 2024-08-16 (×10): qty 1

## 2024-08-16 NOTE — Progress Notes (Signed)
   08/16/24 2127  BiPAP/CPAP/SIPAP  Reason BIPAP/CPAP not in use Non-compliant   Pt does not wear cpap, no machine is at bedside. He states he doesn't have a problem sleeping nor does he snore.

## 2024-08-16 NOTE — Progress Notes (Signed)
 [name] PROGRESS NOTE   [name] FMW:[id number] DOB: Jan 26, 1956 DOA: 08/11/2024  PCP: [name], MD  Brief History: 68 y.o. male with medical history significant of anxiety, depression, schizoaffective disorder, bowel obstruction, chronic anemia, colon polyps, constipation, GERD, type 2 diabetes, hyperlipidemia, hypertension, memory loss, MRSA carrier, history of pneumonia, seizure disorder, skin cancer who was brought to the emergency department after he had a fall.  Per patient, he stood up too fast, felt dizzy and then fell down.  He hit his head, but stated not very hard.  He did not lose consciousness.  Imaging studies revealed right ankle fracture.  He was hospitalized for further management.    Consultants: Orthopedics  Procedures: ORIF right ankle fracture    Subjective/Interval History: Patient noted to be asleep when I examined him this morning.  He woke up.  Denied any complaints.  No nausea vomiting.  No abdominal pain.  Right ankle pain is better controlled.    Assessment/Plan:  Acute respiratory failure with hypoxia/aspiration pneumonia Postoperatively the patient could not be weaned off of oxygen.  Chest x-ray suggested left lung infiltrate.  It is quite possible patient may have aspirated.  Recently done echocardiogram showed normal LVEF with grade 1 diastolic dysfunction. Patient was started on Unasyn  for possible aspiration.  Was also given 20 mg of furosemide  with which he did diurese.   Oxygenation has improved though he still on nasal cannula oxygen.  Chest x-ray also shows improvement. Changed to oral antibiotics today.  Right ankle fracture Patient seen by orthopedics and underwent surgery on 8/28.  Pain seems to be reasonably well-controlled.  PT OT eval. SNF is recommended for short-term rehab.  Acute urinary retention He has had 2 In-N-Out catheterizations in the last 24 hours.  He is on Flomax .  Probably due to immobilization.  Will  check a UA.  Hyponatremia Likely due to mild hypovolemia.  Has improved with IV hydration.  Grade 1 diastolic dysfunction/history of mitral regurgitation Stable from a cardiac standpoint.  Echocardiogram shows LVEF of 50 to 55%.  Grade 1 diastolic dysfunction noted.  No significant valvular abnormalities. See above regarding respiratory failure.    Essential hypertension Metoprolol  was held due to soft blood pressures.  Blood pressures appear to have rebounded.  Metoprolol  resumed at a lower dose.  Blood pressure is stable.  Normocytic anemia Mild drop in hemoglobin is most likely dilutional.  Subsequently stable.  Hyperlipidemia Continue statin.  Seizure disorder Continue lacosamide .  Chronic constipation Continue bowel regimen.  Diabetes mellitus type 2 Noted to be on metformin  which is being continued.  SSI.  Monitor CBGs. HbA1c is 6.9.  Sleep apnea CPAP  Class I obesity Estimated body mass index is 32.82 kg/m as calculated from the following:   Height as of this encounter: 5' 9 (1.753 m).   Weight as of this encounter: 100.8 kg.   DVT Prophylaxis: Lovenox  Code Status: Full code Family Communication: Discussed with patient Disposition Plan: SNF when medically stable     Medications: Scheduled:  amitriptyline   200 mg Oral QHS   atorvastatin   40 mg Oral QHS   Chlorhexidine  Gluconate Cloth  6 each Topical Daily   enoxaparin  (LOVENOX ) injection  50 mg Subcutaneous Q24H   insulin  aspart  0-15 Units Subcutaneous TID WC   lacosamide   150 mg Oral BID   melatonin  3 mg Oral QHS   metFORMIN   1,000 mg Oral Q breakfast   metoprolol  succinate  50 mg Oral Daily  polyethylene glycol  17 g Oral Daily   saccharomyces boulardii  250 mg Oral BID   senna-docusate  1 tablet Oral BID   tamsulosin   0.4 mg Oral Daily   Continuous:  ampicillin -sulbactam (UNASYN ) IV 3 g (08/16/24 0803)   PRN:acetaminophen  **OR** acetaminophen , hydrOXYzine , ipratropium-albuterol , ondansetron   **OR** ondansetron  (ZOFRAN ) IV, mouth rinse, oxyCODONE    Objective:  Vital Signs  Vitals:   08/15/24 0932 08/15/24 1313 08/15/24 2100 08/16/24 0615  BP: 132/71 (!) 143/87 129/69 (!) 143/84  Pulse: 97 85 89 77  Resp: 19 19 18 19   Temp: (!) 97.4 F (36.3 C) 99.1 F (37.3 C) 98.7 F (37.1 C) 97.9 F (36.6 C)  TempSrc: Oral Oral Oral Oral  SpO2: 97% 97% 94% 93%  Weight:      Height:        Intake/Output Summary (Last 24 hours) at 08/16/2024 0908 Last data filed at 08/16/2024 0700 Gross per 24 hour  Intake 650.65 ml  Output 1300 ml  Net -649.35 ml   Filed Weights   08/11/24 0857 08/11/24 1259 08/13/24 1843  Weight: 99.8 kg 99.8 kg 100.8 kg    General appearance: Alert but easily arousable.  In no distress Resp: Clear to auscultation bilaterally.  Normal effort Cardio: S1-S2 is normal regular.  No S3-S4.  No rubs murmurs or bruit GI: Abdomen is soft.  Nontender nondistended.  Bowel sounds are present normal.  No masses organomegaly Extremities: Right lower extremity is in a dressing.  Lab Results:  Data Reviewed: I have personally reviewed following labs and reports of the imaging studies  CBC: Recent Labs  Lab 08/11/24 0909 08/12/24 0656 08/13/24 2122 08/14/24 0310 08/15/24 0347  WBC 9.7 9.2 12.1* 10.8* 7.8  NEUTROABS 7.6  --   --   --   --   HGB 13.3 14.3 13.9 12.8* 12.0*  HCT 40.0 41.4 39.4 37.4* 35.6*  MCV 93.9 94.3 90.6 92.1 94.7  PLT 194 191 186 186 179    Basic Metabolic Panel: Recent Labs  Lab 08/11/24 0909 08/12/24 0656 08/14/24 0310 08/15/24 0347  NA 133* 136 136 137  K 4.3 4.1 4.3 3.7  CL 102 101 101 102  CO2 21* 22 18* 23  GLUCOSE 136* 135* 131* 136*  BUN 24* 21 13 17   CREATININE 1.06 1.01 0.96 0.92  CALCIUM  8.9 9.2 9.2 8.7*  MG  --   --  2.4  --     GFR: Estimated Creatinine Clearance: 87.4 mL/min (by C-G formula based on SCr of 0.92 mg/dL).  Liver Function Tests: Recent Labs  Lab 08/12/24 0656 08/14/24 0310  AST 31 31  ALT  46* 35  ALKPHOS 101 84  BILITOT 0.7 0.5  PROT 6.8 6.4*  ALBUMIN  4.2 3.7    CBG: Recent Labs  Lab 08/15/24 0741 08/15/24 1143 08/15/24 1621 08/15/24 2126 08/16/24 0754  GLUCAP 144* 165* 141* 188* 110*     Radiology Studies: DG CHEST PORT 1 VIEW Result Date: 08/15/2024 CLINICAL DATA:  Hemoptysis.  Dysphagia. EXAM: PORTABLE CHEST 1 VIEW COMPARISON:  08/14/2024 FINDINGS: Normal-sized heart. Clear lungs with normal vascularity. Stable minimal chronic interstitial prominence. A poor inspiration is again demonstrated. Stable left shoulder prosthesis. IMPRESSION: No acute abnormality. Electronically Signed   By: Elspeth Bathe M.D.   On: 08/15/2024 10:22       LOS: 5 days   Trenden Hazelrigg Verdene  [name] Pager on [email or web address]  08/16/2024, 9:08 AM

## 2024-08-16 NOTE — Plan of Care (Signed)

## 2024-08-16 NOTE — Plan of Care (Signed)

## 2024-08-17 LAB — CBC
HCT: 35.8 % — ABNORMAL LOW (ref 39.0–52.0)
Hemoglobin: 12.1 g/dL — ABNORMAL LOW (ref 13.0–17.0)
MCH: 32.1 pg (ref 26.0–34.0)
MCHC: 33.8 g/dL (ref 30.0–36.0)
MCV: 95 fL (ref 80.0–100.0)
Platelets: 197 K/uL (ref 150–400)
RBC: 3.77 MIL/uL — ABNORMAL LOW (ref 4.22–5.81)
RDW: 13 % (ref 11.5–15.5)
WBC: 8 K/uL (ref 4.0–10.5)
nRBC: 0 % (ref 0.0–0.2)

## 2024-08-17 LAB — BASIC METABOLIC PANEL WITH GFR
Anion gap: 12 (ref 5–15)
BUN: 17 mg/dL (ref 8–23)
CO2: 24 mmol/L (ref 22–32)
Calcium: 9.1 mg/dL (ref 8.9–10.3)
Chloride: 102 mmol/L (ref 98–111)
Creatinine, Ser: 0.84 mg/dL (ref 0.61–1.24)
GFR, Estimated: >60 mL/min (ref >60)
Glucose, Bld: 125 mg/dL — ABNORMAL HIGH (ref 70–99)
Potassium: 4.1 mmol/L (ref 3.5–5.1)
Sodium: 138 mmol/L (ref 135–145)

## 2024-08-17 LAB — GLUCOSE, CAPILLARY
Glucose-Capillary: 142 mg/dL — ABNORMAL HIGH (ref 70–99)
Glucose-Capillary: 158 mg/dL — ABNORMAL HIGH (ref 70–99)
Glucose-Capillary: 123 mg/dL — ABNORMAL HIGH (ref 70–99)
Glucose-Capillary: 121 mg/dL — ABNORMAL HIGH (ref 70–99)

## 2024-08-17 MED ORDER — BISACODYL 10 MG RE SUPP: 10.0000 mg | Freq: Once | RECTAL | Status: DC

## 2024-08-17 MED ORDER — LORAZEPAM 2 MG/ML IJ SOLN: 0.5000 mg | Freq: Once | INTRAMUSCULAR | Status: DC

## 2024-08-17 MED ORDER — POLYETHYLENE GLYCOL 3350 17 G PO PACK
17.0000 g | PACK | Freq: Two times a day (BID) | ORAL | Status: DC
  Administered 2024-08-17 – 2024-08-31 (×28): 17 g via ORAL
  Filled 2024-08-17 (×29): qty 1

## 2024-08-17 MED ORDER — SENNOSIDES-DOCUSATE SODIUM 8.6-50 MG PO TABS
2.0000 | ORAL_TABLET | Freq: Two times a day (BID) | ORAL | Status: DC
  Administered 2024-08-17 – 2024-08-31 (×27): 2 via ORAL
  Filled 2024-08-17 (×29): qty 2

## 2024-08-17 NOTE — Plan of Care (Signed)
   Problem: Education: Goal: Knowledge of General Education information will improve Description Including pain rating scale, medication(s)/side effects and non-pharmacologic comfort measures Outcome: Progressing   Problem: Health Behavior/Discharge Planning: Goal: Ability to manage health-related needs will improve Outcome: Progressing

## 2024-08-17 NOTE — Plan of Care (Signed)
  Problem: Pain Managment: Goal: General experience of comfort will improve and/or be controlled Outcome: Progressing   Problem: Safety: Goal: Ability to remain free from injury will improve Outcome: Progressing   Problem: Skin Integrity: Goal: Risk for impaired skin integrity will decrease Outcome: Progressing   Problem: Pain Managment: Goal: General experience of comfort will improve and/or be controlled Outcome: Progressing   Problem: Safety: Goal: Ability to remain free from injury will improve Outcome: Progressing   Problem: Skin Integrity: Goal: Risk for impaired skin integrity will decrease Outcome: Progressing

## 2024-08-17 NOTE — TOC Progression Note (Signed)
 Transition of Care [facility]) - Progression Note    Patient Details  Name: [name] MRN: [id number] Date of Birth: [birth year]  [facility] CM/SW Contact  NORMAN ASPEN, LCSW Phone Number: 08/17/2024, 3:11 PM  Clinical Narrative:     Met with pt today to review dc planning needs.  Pt is aware that PT recommendations were made for SNF, however, primary barrier to this is that he is currently uninsured.  Very difficult to follow pt's reasoning of how he has had both a Medicaid and Medicare coverage in the past but no longer has any active coverage.  He explains that he was on social security disability and never paid into the system.  Regardless of reasons, have discussed with pt that SNF is not really an option if he has no payor source.  He does report that his s/o, [name], can provide some assistance but does not live with him.  He states, If she Gennie) could just pay for me to go somewhere (SNF) for 3 days then I will have the forms and can just follow those exercises and do it all on my own.  Again, attempted to talk this through with pt and explain this is not really a valid plan.  Will alert PT who will be seeing him again today to concerns and hope that he shows some improvement to make a home discharge a more viable plan.    Barriers to Discharge: Continued Medical Work up               Expected Discharge Plan and Services                                               Social Drivers of Health (SDOH) Interventions SDOH Screenings   Food Insecurity: Food Insecurity Present (08/11/2024)  Housing: Low Risk  (08/11/2024)  Transportation Needs: Unmet Transportation Needs (08/11/2024)  Utilities: At Risk (08/11/2024)  Alcohol Screen: Low Risk  (08/04/2018)  Depression (PHQ2-9): High Risk (07/23/2024)  Financial Resource Strain: Low Risk  (08/04/2018)  Physical Activity: Inactive (08/04/2018)  Social Connections: Unknown (08/11/2024)  Stress: No  Stress Concern Present (08/04/2018)  Tobacco Use: Low Risk  (08/13/2024)    Readmission Risk Interventions     No data to display

## 2024-08-17 NOTE — Progress Notes (Signed)
 [name] PROGRESS NOTE   GRIFF BADLEY FMW:[id number] DOB: 06-04-1956 DOA: 08/11/2024  PCP: [name], MD  Brief History: 68 y.o. male with medical history significant of anxiety, depression, schizoaffective disorder, bowel obstruction, chronic anemia, colon polyps, constipation, GERD, type 2 diabetes, hyperlipidemia, hypertension, memory loss, MRSA carrier, history of pneumonia, seizure disorder, skin cancer who was brought to the emergency department after he had a fall.  Per patient, he stood up too fast, felt dizzy and then fell down.  He hit his head, but stated not very hard.  He did not lose consciousness.  Imaging studies revealed right ankle fracture.  He was hospitalized for further management.    Consultants: Orthopedics  Procedures: ORIF right ankle fracture    Subjective/Interval History: Patient denies any complaints.  No significant pain in the right ankle.  Denies any nausea vomiting or abdominal pain.  Still has not had a bowel movement.    Assessment/Plan:  Acute respiratory failure with hypoxia/aspiration pneumonia Postoperatively the patient could not be weaned off of oxygen.  Chest x-ray suggested left lung infiltrate.  It is quite possible patient may have aspirated.  Recently done echocardiogram showed normal LVEF with grade 1 diastolic dysfunction. Patient was started on antibiotics.  Was also given a dose of furosemide . His oxygenation improved.  Chest x-ray shows improvement. Changed over to oral antibiotics. Wean off oxygen to maintain saturations greater than 90%.  Right ankle fracture Patient seen by orthopedics and underwent surgery on 8/28.  Pain seems to be reasonably well-controlled.  PT OT eval. SNF is recommended for short-term rehab.  Acute urinary retention Foley catheter had to be placed after multiple In-N-Out catheterizations.  He is on Flomax .  Retention is probably due to immobilization and constipation.  UA reviewed.  Not  really significant for UTI.  He is already on antibiotics. Voiding trial needs to be done once he is more ambulatory.  Will need to be done at urology office.    Constipation Has not had a bowel movement in several days.  This is despite laxatives.  Will give suppository today.  Abdomen is benign to examination.  Hyponatremia Likely due to mild hypovolemia.  Has improved with IV hydration.  Grade 1 diastolic dysfunction/history of mitral regurgitation Stable from a cardiac standpoint.  Echocardiogram shows LVEF of 50 to 55%.  Grade 1 diastolic dysfunction noted.  No significant valvular abnormalities. See above regarding respiratory failure.    Essential hypertension Metoprolol  was held due to soft blood pressures.  Blood pressures appear to have rebounded.  Metoprolol  resumed at a lower dose.  Blood pressure is stable.  Normocytic anemia Mild drop in hemoglobin is most likely dilutional.  Subsequently stable.  Hyperlipidemia Continue statin.  Seizure disorder Continue lacosamide .  Chronic constipation Continue bowel regimen.  Diabetes mellitus type 2 Noted to be on metformin  which is being continued.  SSI.  Monitor CBGs. HbA1c is 6.9.  Sleep apnea CPAP  Class I obesity Estimated body mass index is 32.82 kg/m as calculated from the following:   Height as of this encounter: 5' 9 (1.753 m).   Weight as of this encounter: 100.8 kg.   DVT Prophylaxis: Lovenox  Code Status: Full code Family Communication: Discussed with patient Disposition Plan: SNF when medically stable     Medications: Scheduled:  amitriptyline   200 mg Oral QHS   amoxicillin -clavulanate  1 tablet Oral Q12H   atorvastatin   40 mg Oral QHS   bisacodyl   10 mg Rectal Once   Chlorhexidine   Gluconate Cloth  6 each Topical Daily   enoxaparin  (LOVENOX ) injection  50 mg Subcutaneous Q24H   insulin  aspart  0-15 Units Subcutaneous TID WC   lacosamide   150 mg Oral BID   LORazepam   0.5 mg Intravenous Once    melatonin  3 mg Oral QHS   metFORMIN   1,000 mg Oral Q breakfast   metoprolol  succinate  50 mg Oral Daily   polyethylene glycol  17 g Oral BID   saccharomyces boulardii  250 mg Oral BID   senna-docusate  2 tablet Oral BID   tamsulosin   0.4 mg Oral Daily   Continuous:   PRN:acetaminophen  **OR** acetaminophen , hydrOXYzine , ipratropium-albuterol , ondansetron  **OR** ondansetron  (ZOFRAN ) IV, mouth rinse, oxyCODONE    Objective:  Vital Signs  Vitals:   08/16/24 1412 08/16/24 2130 08/17/24 0035 08/17/24 0514  BP: (!) 144/81 (!) 156/87  114/67  Pulse: 77 85 77 78  Resp: 20 20  18   Temp: 98.7 F (37.1 C) 98.6 F (37 C)  98.2 F (36.8 C)  TempSrc:  Oral  Oral  SpO2: 98% 97% 93% 95%  Weight:      Height:        Intake/Output Summary (Last 24 hours) at 08/17/2024 0919 Last data filed at 08/17/2024 0600 Gross per 24 hour  Intake 1300 ml  Output 2400 ml  Net -1100 ml   Filed Weights   08/11/24 0857 08/11/24 1259 08/13/24 1843  Weight: 99.8 kg 99.8 kg 100.8 kg    General appearance: Awake alert.  In no distress Resp: Clear to auscultation bilaterally.  Normal effort Cardio: S1-S2 is normal regular.  No S3-S4.  No rubs murmurs or bruit GI: Abdomen is soft.  Nontender nondistended.  Bowel sounds are present normal.  No masses organomegaly No focal neurological deficits.  Lab Results:  Data Reviewed: I have personally reviewed following labs and reports of the imaging studies  CBC: Recent Labs  Lab 08/11/24 0909 08/12/24 0656 08/13/24 2122 08/14/24 0310 08/15/24 0347 08/17/24 0345  WBC 9.7 9.2 12.1* 10.8* 7.8 8.0  NEUTROABS 7.6  --   --   --   --   --   HGB 13.3 14.3 13.9 12.8* 12.0* 12.1*  HCT 40.0 41.4 39.4 37.4* 35.6* 35.8*  MCV 93.9 94.3 90.6 92.1 94.7 95.0  PLT 194 191 186 186 179 197    Basic Metabolic Panel: Recent Labs  Lab 08/11/24 0909 08/12/24 0656 08/14/24 0310 08/15/24 0347 08/17/24 0345  NA 133* 136 136 137 138  K 4.3 4.1 4.3 3.7 4.1  CL 102 101  101 102 102  CO2 21* 22 18* 23 24  GLUCOSE 136* 135* 131* 136* 125*  BUN 24* 21 13 17 17   CREATININE 1.06 1.01 0.96 0.92 0.84  CALCIUM  8.9 9.2 9.2 8.7* 9.1  MG  --   --  2.4  --   --     GFR: Estimated Creatinine Clearance: 95.7 mL/min (by C-G formula based on SCr of 0.84 mg/dL).  Liver Function Tests: Recent Labs  Lab 08/12/24 0656 08/14/24 0310  AST 31 31  ALT 46* 35  ALKPHOS 101 84  BILITOT 0.7 0.5  PROT 6.8 6.4*  ALBUMIN  4.2 3.7    CBG: Recent Labs  Lab 08/16/24 0754 08/16/24 1135 08/16/24 1702 08/16/24 2127 08/17/24 0744  GLUCAP 110* 195* 145* 163* 142*     Radiology Studies: DG CHEST PORT 1 VIEW Result Date: 08/15/2024 CLINICAL DATA:  Hemoptysis.  Dysphagia. EXAM: PORTABLE CHEST 1 VIEW COMPARISON:  08/14/2024 FINDINGS:  Normal-sized heart. Clear lungs with normal vascularity. Stable minimal chronic interstitial prominence. A poor inspiration is again demonstrated. Stable left shoulder prosthesis. IMPRESSION: No acute abnormality. Electronically Signed   By: Elspeth Bathe M.D.   On: 08/15/2024 10:22       LOS: 6 days   Arora Coakley Verdene  [name] Pager on [email or web address]  08/17/2024, 9:19 AM

## 2024-08-17 NOTE — Progress Notes (Signed)
 Physical Therapy Treatment Patient Details Name: [name] MRN: [id number] DOB: [birth year] Today's Date: 08/17/2024   History of Present Illness [name] is a 68 y.o. male admitted 08/11/24 with dx R ankle fx with medical history significant of anxiety, depression, schizoaffective disorder, bowel obstruction, chronic anemia, colon polyps, constipation, GERD, type 2 diabetes, hyperlipidemia, hypertension, memory loss, MRSA carrier, history of pneumonia, seizure disorder, skin cancer who was brought to the emergency department after he had a fall.    PT Comments   Pt admitted with above diagnosis.  Pt currently with functional limitations due to the deficits listed below (see PT Problem List). Pt in bed when PT arrived. Pt agreeable to therapy intervention. Pt reports his pain is better and ok when he is not moving. Pt required min A for supine to sit EOB, pt ed provided on R LE NWB and pt able to maintain with cues, pt required mod a x 2 for sit to stand  and SPT from bed to recliner with limited eccentric control to sitting surface and PT placing foot under pt R LE for immediate cues and feed back to facilitate R NWB status. Pt left seated in recliner and all needs in place. Patient will benefit from continued inpatient follow up therapy, <3 hours/day. Pt will benefit from acute skilled PT to increase their independence and safety with mobility to allow discharge.      If plan is discharge home, recommend the following: Two people to help with walking and/or transfers;A lot of help with bathing/dressing/bathroom;Assistance with cooking/housework;Assist for transportation;Help with stairs or ramp for entrance   Can travel by private vehicle     No  Equipment Recommendations  Other (comment) (deferr to next venue)    Recommendations for Other Services       Precautions / Restrictions Precautions Precautions: Fall Restrictions Weight Bearing Restrictions Per Provider Order: Yes RLE  Weight Bearing Per Provider Order: Non weight bearing     Mobility  Bed Mobility Overal bed mobility: Needs Assistance Bed Mobility: Supine to Sit     Supine to sit: Min assist, HOB elevated, Used rails     General bed mobility comments: pt required increased time cues with use of hospital bed    Transfers Overall transfer level: Needs assistance   Transfers: Sit to/from Stand, Bed to chair/wheelchair/BSC Sit to Stand: Mod assist, +2 physical assistance, +2 safety/equipment, From elevated surface Stand pivot transfers: Mod assist, +2 physical assistance, +2 safety/equipment         General transfer comment: pt required cues and mod A x 2 pt exhibited abiltiy to power up and obtain extension posture with B UE support at RW, PT had pt place R LE on PTs foot throughout interevntion to provide cues to maintain R LE NWB, pt able to maintain with constant cues as above and complete SPT to recliner with poor eccentric control to recliner.    Ambulation/Gait               General Gait Details: NT   Stairs             Wheelchair Mobility     Tilt Bed    Modified Rankin (Stroke Patients Only)       Balance Overall balance assessment: History of Falls, Needs assistance Sitting-balance support: Feet supported, Bilateral upper extremity supported (L LE) Sitting balance-Leahy Scale: Fair  Communication Communication Communication: No apparent difficulties  Cognition Arousal: Alert Behavior During Therapy: Anxious   PT - Cognitive impairments: No apparent impairments                         Following commands: Intact      Cueing    Exercises      General Comments General comments (skin integrity, edema, etc.): pt required cues for pursed lip breathing, no longer on supplemental O2 >/=93% on RA. pt remains anxious about mobiltiy      Pertinent Vitals/Pain Pain Assessment Pain Assessment:  Faces Faces Pain Scale: Hurts little more Pain Location: R distal LE, chest when coughing Pain Descriptors / Indicators: Aching, Constant, Discomfort, Grimacing, Guarding Pain Intervention(s): Limited activity within patient's tolerance, Monitored during session, Premedicated before session, Repositioned    Home Living                          Prior Function            PT Goals (current goals can now be found in the care plan section) Acute Rehab PT Goals Patient Stated Goal: to be able to go home PT Goal Formulation: With patient Time For Goal Achievement: 08/28/24 Potential to Achieve Goals: Fair Progress towards PT goals: Progressing toward goals    Frequency    Min 3X/week      PT Plan      Co-evaluation              AM-PAC PT 6 Clicks Mobility   Outcome Measure  Help needed turning from your back to your side while in a flat bed without using bedrails?: A Little Help needed moving from lying on your back to sitting on the side of a flat bed without using bedrails?: A Little Help needed moving to and from a bed to a chair (including a wheelchair)?: A Lot Help needed standing up from a chair using your arms (e.g., wheelchair or bedside chair)?: A Lot Help needed to walk in hospital room?: Total Help needed climbing 3-5 steps with a railing? : Total 6 Click Score: 12    End of Session Equipment Utilized During Treatment: Gait belt Activity Tolerance: No increased pain;Patient limited by fatigue Patient left: in bed;with call bell/phone within reach;with nursing/sitter in room Nurse Communication: Mobility status;Other (comment);Patient requests pain meds;Need for lift equipment (reprots of chest and R LE pain and O2 desaturation on RA and when on 1 L/min supplemental O2) PT Visit Diagnosis: Unsteadiness on feet (R26.81);Other abnormalities of gait and mobility (R26.89);Muscle weakness (generalized) (M62.81);History of falling (Z91.81);Difficulty in  walking, not elsewhere classified (R26.2);Pain Pain - Right/Left: Right Pain - part of body: Leg;Ankle and joints of foot     Time: 8463-8444 PT Time Calculation (min) (ACUTE ONLY): 19 min  Charges:    $Therapeutic Activity: 8-22 mins PT General Charges $$ ACUTE PT VISIT: 1 Visit                     [name], PT Acute Rehab    [name] VEAR Drone 08/17/2024, 4:34 PM

## 2024-08-18 LAB — GLUCOSE, CAPILLARY
Glucose-Capillary: 130 mg/dL — ABNORMAL HIGH (ref 70–99)
Glucose-Capillary: 197 mg/dL — ABNORMAL HIGH (ref 70–99)
Glucose-Capillary: 158 mg/dL — ABNORMAL HIGH (ref 70–99)
Glucose-Capillary: 167 mg/dL — ABNORMAL HIGH (ref 70–99)

## 2024-08-18 MED ORDER — LACTULOSE 10 GM/15ML PO SOLN
30.0000 g | Freq: Once | ORAL | Status: AC
  Administered 2024-08-18: 30 g via ORAL
  Filled 2024-08-18: qty 45

## 2024-08-18 MED ORDER — FLEET ENEMA RE ENEM
1.0000 | ENEMA | Freq: Once | RECTAL | Status: AC
  Administered 2024-08-18: 1 via RECTAL
  Filled 2024-08-18: qty 1

## 2024-08-18 NOTE — Progress Notes (Signed)
 Patient had assisted fall around 1150. Vitals taken and are stable. Patient has no complaints of dizziness or pain. Now resting comfortably in bed. MD Gokul notified. No new orders at this time.

## 2024-08-18 NOTE — Progress Notes (Signed)
 Physical Therapy Treatment Patient Details Name: [name] MRN: [id number] DOB: 1956-04-30 Today's Date: 08/18/2024   History of Present Illness [name] is a 68 y.o. male admitted 08/11/24 with dx R ankle fx with medical history significant of anxiety, depression, schizoaffective disorder, bowel obstruction, chronic anemia, colon polyps, constipation, GERD, type 2 diabetes, hyperlipidemia, hypertension, memory loss, MRSA carrier, history of pneumonia, seizure disorder, skin cancer who was brought to the emergency department after he had a fall. Pt sustained assisted fall on 08/18/2024 with nursing staff, no apparent injury or report of change in R LE pain.     PT Comments   Pt admitted with above diagnosis.  Pt currently with functional limitations due to the deficits listed below (see PT Problem List). Pt in bed when therapist arrived. Pt agreeable to therapy intervention. Pt required CGA and increased time with use of hospital bed for supine to sit, G sitting balance EOB, min A x 2 for sit to stand  and SPT with RW and R knee support with extensive cues. Pt reported that he thinks he will be fine to go home in 3 days with support of his significant other, PT expressed concern per pt safety, fall risk and required physical assist at this time of A x 2. Pt stated he was going to be fine and able to put weight through his R LE prior to d/c  PT provided pt ed on anticipated R distal LE NWB for 6 weeks. Pt will require 24/7 supervision and assist in next venue. PT continues to recommend inpatient follow up therapy, <3 hours/day.  Pt will benefit from acute skilled PT to increase their independence and safety with mobility to allow discharge.      If plan is discharge home, recommend the following: Two people to help with walking and/or transfers;A lot of help with bathing/dressing/bathroom;Assistance with cooking/housework;Assist for transportation;Help with stairs or ramp for entrance   Can travel by  private vehicle     No  Equipment Recommendations  Other (comment) (deferr to next venue)    Recommendations for Other Services       Precautions / Restrictions Precautions Precautions: Fall Restrictions Weight Bearing Restrictions Per Provider Order: Yes RLE Weight Bearing Per Provider Order: Non weight bearing     Mobility  Bed Mobility Overal bed mobility: Needs Assistance Bed Mobility: Supine to Sit     Supine to sit: HOB elevated, Used rails, Contact guard     General bed mobility comments: pt required increased time cues with use of hospital bed    Transfers Overall transfer level: Needs assistance Equipment used: Rolling walker (2 wheels) (knee walker support R LE) Transfers: Sit to/from Stand, Bed to chair/wheelchair/BSC Sit to Stand: +2 physical assistance, +2 safety/equipment, From elevated surface, Min assist Stand pivot transfers: +2 physical assistance, +2 safety/equipment, Min assist         General transfer comment: pt required cues and min A x 2 pt exhibited abiltiy to power up and obtain extension posture with B UE support at [facility] with R knee walker support, pt instructed to stand with R foot placed on PTs and then guided through transitioning R LE onto knee support with multimodal cues, pt demonstrated increased stability, IND and safety with R knee support on RW, once standing infront of recliner pt required specific cues for removing R UE from knee support and transitionoing R foot back to therapists foot pt exhibited difficulty with retrograde stepping pattern with L once R LE  on PTs foot thus increasing distance from recliner pt ed on use of B UE support and reaching bottom back in recliner, pt unable to demonstrate abiltiy to transitoin hands to recliner resulting in poor eccentric control and closer to mod A x 2 for safety. pt reported liking the knee support and PT to continue to provide pt and staff instruction. PT able to provide verbal and visual ed to  nurse tech and nurse per use of RW with knee support and having pt place R LE on nurse/nurse tech foot during transitions to ensure and provide cues for R LE NWB staff verbalized understanding    Ambulation/Gait               General Gait Details: NT   Stairs             Wheelchair Mobility     Tilt Bed    Modified Rankin (Stroke Patients Only)       Balance Overall balance assessment: History of Falls, Needs assistance Sitting-balance support: Feet supported, Bilateral upper extremity supported (L LE) Sitting balance-Leahy Scale: Good     Standing balance support: Bilateral upper extremity supported, During functional activity, Reliant on assistive device for balance Standing balance-Leahy Scale: Poor                              Communication Communication Communication: No apparent difficulties  Cognition Arousal: Alert Behavior During Therapy: Anxious   PT - Cognitive impairments: No apparent impairments                         Following commands: Intact      Cueing    Exercises General Exercises - Lower Extremity Ankle Circles/Pumps: AROM, Left, 10 reps Long Arc Quad: AROM, Both, 10 reps Heel Slides: AROM, Both, 10 reps Hip ABduction/ADduction: AROM, Both, 10 reps    General Comments General comments (skin integrity, edema, etc.): cues for pursed lip breathing with reports of SOB and O2 saturation breifely decreasing to 89% with transfer tasks.      Pertinent Vitals/Pain Pain Assessment Pain Assessment: Faces Faces Pain Scale: Hurts a little bit Pain Location: R distal LE, chest when coughing Pain Descriptors / Indicators: Aching, Constant, Discomfort, Grimacing, Guarding Pain Intervention(s): Limited activity within patient's tolerance, Monitored during session, Premedicated before session, Repositioned    Home Living                          Prior Function            PT Goals (current goals can  now be found in the care plan section) Acute Rehab PT Goals Patient Stated Goal: to be able to go home PT Goal Formulation: With patient Time For Goal Achievement: 08/28/24 Potential to Achieve Goals: Fair Progress towards PT goals: Progressing toward goals    Frequency    Min 3X/week      PT Plan      Co-evaluation PT/OT/SLP Co-Evaluation/Treatment: Yes Reason for Co-Treatment: Complexity of the patient's impairments (multi-system involvement);For patient/therapist safety PT goals addressed during session: Mobility/safety with mobility;Balance;Proper use of DME OT goals addressed during session: ADL's and self-care      AM-PAC PT 6 Clicks Mobility   Outcome Measure  Help needed turning from your back to your side while in a flat bed without using bedrails?: A Little Help needed moving from lying on  your back to sitting on the side of a flat bed without using bedrails?: A Little Help needed moving to and from a bed to a chair (including a wheelchair)?: A Lot Help needed standing up from a chair using your arms (e.g., wheelchair or bedside chair)?: A Lot Help needed to walk in hospital room?: Total Help needed climbing 3-5 steps with a railing? : Total 6 Click Score: 12    End of Session Equipment Utilized During Treatment: Gait belt Activity Tolerance: No increased pain;Patient limited by fatigue Patient left: with call bell/phone within reach;in chair;with chair alarm set Nurse Communication: Mobility status PT Visit Diagnosis: Unsteadiness on feet (R26.81);Other abnormalities of gait and mobility (R26.89);Muscle weakness (generalized) (M62.81);History of falling (Z91.81);Difficulty in walking, not elsewhere classified (R26.2);Pain Pain - Right/Left: Right Pain - part of body: Leg;Ankle and joints of foot     Time: 1352-1417 PT Time Calculation (min) (ACUTE ONLY): 25 min  Charges:    $Therapeutic Exercise: 8-22 mins $Therapeutic Activity: 8-22 mins PT General  Charges $$ ACUTE PT VISIT: 1 Visit                     [place], PT Acute Rehab    [place] VEAR Drone 08/18/2024, 8:09 PM

## 2024-08-18 NOTE — Progress Notes (Addendum)
 [name] PROGRESS NOTE   [name] FMW:[id number] DOB: Dec 22, 1955 DOA: 08/11/2024  PCP: [name], MD  Brief History: 68 y.o. male with medical history significant of anxiety, depression, schizoaffective disorder, bowel obstruction, chronic anemia, colon polyps, constipation, GERD, type 2 diabetes, hyperlipidemia, hypertension, memory loss, MRSA carrier, history of pneumonia, seizure disorder, skin cancer who was brought to the emergency department after he had a fall.  Per patient, he stood up too fast, felt dizzy and then fell down.  He hit his head, but stated not very hard.  He did not lose consciousness.  Imaging studies revealed right ankle fracture.  He was hospitalized for further management.    Consultants: Orthopedics  Procedures: ORIF right ankle fracture    Subjective/Interval History: Without any complaints.  Still has not had a bowel movement.  Denies any nausea vomiting abdominal pain.  No significant pain in the right ankle.    Assessment/Plan:  Acute respiratory failure with hypoxia/aspiration pneumonia Postoperatively the patient could not be weaned off of oxygen.  Chest x-ray suggested left lung infiltrate.  It is quite possible patient may have aspirated.  Recently done echocardiogram showed normal LVEF with grade 1 diastolic dysfunction. Patient was started on antibiotics.  Was also given a dose of furosemide . Oxygenation has improved though he remains on 1 to 2 L of oxygen by nasal cannula.  Probably due to his sleep apnea. He was changed over to oral antibiotics.  Will give him a total of 7-day course.  Try to wean off oxygen to maintain saturations greater than 90%.  Right ankle fracture Patient seen by orthopedics and underwent surgery on 8/28.  Pain seems to be reasonably well-controlled.  PT OT eval. SNF is recommended for short-term rehab.  Acute urinary retention Foley catheter had to be placed after multiple In-N-Out  catheterizations.  He is on Flomax .  Retention is probably due to immobilization and constipation.  UA reviewed.  Not really significant for UTI.  He is already on antibiotics. Voiding trial needs to be done once he is more ambulatory.  May need to be done at urology office.    Constipation Has not had a bowel movement in several days.  This is despite laxatives.  Suppository was ordered yesterday but looks like patient refused.  Will order Fleet enema today.  He was told about the importance of having a BM.  Check TSH.  Hyponatremia Likely due to mild hypovolemia.  Has improved with IV hydration.  Grade 1 diastolic dysfunction/history of mitral regurgitation Stable from a cardiac standpoint.  Echocardiogram shows LVEF of 50 to 55%.  Grade 1 diastolic dysfunction noted.  No significant valvular abnormalities. See above regarding respiratory failure.   Volume status is stable.  Essential hypertension Metoprolol  was held due to soft blood pressures.  Blood pressures appear to have rebounded.  Metoprolol  resumed at a lower dose.  Blood pressure is stable.  Normocytic anemia Mild drop in hemoglobin is most likely dilutional.  Subsequently stable.  Checking labs periodically.  Hyperlipidemia Continue statin.  Seizure disorder Continue lacosamide .  Chronic constipation Continue bowel regimen.  Diabetes mellitus type 2 Noted to be on metformin  which is being continued.  SSI.  Monitor CBGs. HbA1c is 6.9.  Sleep apnea CPAP.  Requiring oxygen at low-dose.  Class I obesity Estimated body mass index is 32.82 kg/m as calculated from the following:   Height as of this encounter: 5' 9 (1.753 m).   Weight as of this encounter: 100.8 kg.  DVT Prophylaxis: Lovenox  Code Status: Full code Family Communication: Discussed with patient Disposition Plan: SNF is recommended for short-term rehab.  However patient does not have any insurance which is going to create a lot of challenge with  placement.  He lives by himself.  He is not at a functioning level where he can be safely discharged.  TOC is following.  If SNF is not really an option then he will need intensive PT and OT in the hospital to try to get him as independent as possible prior to discharge.     Medications: Scheduled:  amitriptyline   200 mg Oral QHS   amoxicillin -clavulanate  1 tablet Oral Q12H   atorvastatin   40 mg Oral QHS   bisacodyl   10 mg Rectal Once   Chlorhexidine  Gluconate Cloth  6 each Topical Daily   enoxaparin  (LOVENOX ) injection  50 mg Subcutaneous Q24H   insulin  aspart  0-15 Units Subcutaneous TID WC   lacosamide   150 mg Oral BID   LORazepam   0.5 mg Intravenous Once   melatonin  3 mg Oral QHS   metFORMIN   1,000 mg Oral Q breakfast   metoprolol  succinate  50 mg Oral Daily   polyethylene glycol  17 g Oral BID   saccharomyces boulardii  250 mg Oral BID   senna-docusate  2 tablet Oral BID   tamsulosin   0.4 mg Oral Daily   Continuous:   PRN:acetaminophen  **OR** acetaminophen , hydrOXYzine , ipratropium-albuterol , ondansetron  **OR** ondansetron  (ZOFRAN ) IV, mouth rinse, oxyCODONE    Objective:  Vital Signs  Vitals:   08/17/24 0920 08/17/24 1334 08/17/24 2234 08/18/24 0619  BP:  (!) 158/83 (!) 140/77 (!) 146/77  Pulse:  86 84 78  Resp:  18 17 18   Temp:  98.4 F (36.9 C) 97.6 F (36.4 C) 98.6 F (37 C)  TempSrc:   Oral Oral  SpO2: 95% 95% 93% 94%  Weight:      Height:        Intake/Output Summary (Last 24 hours) at 08/18/2024 0845 Last data filed at 08/18/2024 0600 Gross per 24 hour  Intake 580 ml  Output 2725 ml  Net -2145 ml   Filed Weights   08/11/24 0857 08/11/24 1259 08/13/24 1843  Weight: 99.8 kg 99.8 kg 100.8 kg    General appearance: Awake alert.  In no distress Resp: Clear to auscultation bilaterally.  Normal effort Cardio: S1-S2 is normal regular.  No S3-S4.  No rubs murmurs or bruit GI: Abdomen is soft.  Nontender nondistended.  Bowel sounds are present normal.  No  masses organomegaly Extremities: Right leg covered in dressing. No focal neurological deficits   Lab Results:  Data Reviewed: I have personally reviewed following labs and reports of the imaging studies  CBC: Recent Labs  Lab 08/11/24 0909 08/12/24 0656 08/13/24 2122 08/14/24 0310 08/15/24 0347 08/17/24 0345  WBC 9.7 9.2 12.1* 10.8* 7.8 8.0  NEUTROABS 7.6  --   --   --   --   --   HGB 13.3 14.3 13.9 12.8* 12.0* 12.1*  HCT 40.0 41.4 39.4 37.4* 35.6* 35.8*  MCV 93.9 94.3 90.6 92.1 94.7 95.0  PLT 194 191 186 186 179 197    Basic Metabolic Panel: Recent Labs  Lab 08/11/24 0909 08/12/24 0656 08/14/24 0310 08/15/24 0347 08/17/24 0345  NA 133* 136 136 137 138  K 4.3 4.1 4.3 3.7 4.1  CL 102 101 101 102 102  CO2 21* 22 18* 23 24  GLUCOSE 136* 135* 131* 136* 125*  BUN 24*  21 13 17 17   CREATININE 1.06 1.01 0.96 0.92 0.84  CALCIUM  8.9 9.2 9.2 8.7* 9.1  MG  --   --  2.4  --   --     GFR: Estimated Creatinine Clearance: 95.7 mL/min (by C-G formula based on SCr of 0.84 mg/dL).  Liver Function Tests: Recent Labs  Lab 08/12/24 0656 08/14/24 0310  AST 31 31  ALT 46* 35  ALKPHOS 101 84  BILITOT 0.7 0.5  PROT 6.8 6.4*  ALBUMIN  4.2 3.7    CBG: Recent Labs  Lab 08/16/24 2127 08/17/24 0744 08/17/24 1145 08/17/24 1641 08/17/24 2120  GLUCAP 163* 142* 158* 123* 121*     Radiology Studies: No results found.      LOS: 7 days   Haniya Fern [employer] on [email or web address]  08/18/2024, 8:45 AM

## 2024-08-18 NOTE — Progress Notes (Signed)
   08/18/24 2121  BiPAP/CPAP/SIPAP  BiPAP/CPAP/SIPAP Pt Type Adult  Reason BIPAP/CPAP not in use Non-compliant (Patient continues to refuse CPAP qhs.)

## 2024-08-19 DIAGNOSIS — S82401A Unspecified fracture of shaft of right fibula, initial encounter for closed fracture: Secondary | ICD-10-CM

## 2024-08-19 LAB — BASIC METABOLIC PANEL WITH GFR
Anion gap: 12 (ref 5–15)
BUN: 13 mg/dL (ref 8–23)
CO2: 24 mmol/L (ref 22–32)
Calcium: 9.3 mg/dL (ref 8.9–10.3)
Chloride: 102 mmol/L (ref 98–111)
Creatinine, Ser: 0.87 mg/dL (ref 0.61–1.24)
GFR, Estimated: >60 mL/min (ref >60)
Glucose, Bld: 133 mg/dL — ABNORMAL HIGH (ref 70–99)
Potassium: 4 mmol/L (ref 3.5–5.1)
Sodium: 138 mmol/L (ref 135–145)

## 2024-08-19 LAB — GLUCOSE, CAPILLARY
Glucose-Capillary: 130 mg/dL — ABNORMAL HIGH (ref 70–99)
Glucose-Capillary: 111 mg/dL — ABNORMAL HIGH (ref 70–99)
Glucose-Capillary: 132 mg/dL — ABNORMAL HIGH (ref 70–99)
Glucose-Capillary: 138 mg/dL — ABNORMAL HIGH (ref 70–99)

## 2024-08-19 LAB — TSH: TSH: 2.73 u[IU]/mL (ref 0.350–4.500)

## 2024-08-19 LAB — MAGNESIUM: Magnesium: 2.4 mg/dL (ref 1.7–2.4)

## 2024-08-19 MED ORDER — LACTULOSE 10 GM/15ML PO SOLN
20.0000 g | Freq: Two times a day (BID) | ORAL | Status: AC
  Filled 2024-08-19 (×2): qty 30

## 2024-08-19 MED ORDER — SMOG ENEMA
400.0000 mL | Freq: Once | RECTAL | Status: AC
  Administered 2024-08-19: 400 mL via RECTAL
  Filled 2024-08-19: qty 960

## 2024-08-19 MED ORDER — BISACODYL 10 MG RE SUPP
10.0000 mg | Freq: Once | RECTAL | Status: AC
  Administered 2024-08-19: 10 mg via RECTAL
  Filled 2024-08-19: qty 1

## 2024-08-19 NOTE — Progress Notes (Signed)
 Physical Therapy Treatment Patient Details Name: [name] MRN: [id number] DOB: February 11, 1956 Today's Date: 08/19/2024   History of Present Illness [name] is a 68 y.o. male admitted 08/11/24 with dx R ankle fx with medical history significant of anxiety, depression, schizoaffective disorder, bowel obstruction, chronic anemia, colon polyps, constipation, GERD, type 2 diabetes, hyperlipidemia, hypertension, memory loss, MRSA carrier, history of pneumonia, seizure disorder, skin cancer who was brought to the emergency department after he had a fall. Assisted mechanical fall 9/2. Nursing reports pt constipated x 8d.    PT Comments   Pt admitted with above diagnosis.  Pt currently with functional limitations due to the deficits listed below (see PT Problem List). Pt seated in recliner when PT returned later in the afternoon, pt agreeable to therapy intervention. Pt required min/CGA x 2 for safety and stability, cues and A for R LE management to maintain R LE NWB status, pt exhibited improved recall and observation of R distal LE NWB status, pt guided through use of RW with knee support with sit to stand  x 2 from recliner and SPT recliner to bed. Pt indicated that he was ready to heal up, pt ed provided on healing process for normal health bone 6-8 weeks. Pt inquired per wheezing, SOB and desaturation with exertion, pt ed provided on being deconditioned at base line and pt has PNA. Pt required CGA and cues for sit to supine, pt left in bed, all needs in place, nursing present to provide pt with enema and significant other entered room at end of therapy session.  Patient will benefit from continued inpatient follow up therapy, <3 hours/day.   Pt will benefit from acute skilled PT to increase their independence and safety with mobility to allow discharge.      If plan is discharge home, recommend the following: Two people to help with walking and/or transfers;A lot of help with  bathing/dressing/bathroom;Assistance with cooking/housework;Assist for transportation;Help with stairs or ramp for entrance   Can travel by private vehicle     No  Equipment Recommendations  Other (comment) (deferr to next venue)    Recommendations for Other Services       Precautions / Restrictions Precautions Precautions: Fall Recall of Precautions/Restrictions: Impaired Precaution/Restrictions Comments: pt continues to require cues for R LE NWB Restrictions Weight Bearing Restrictions Per Provider Order: Yes RLE Weight Bearing Per Provider Order: Non weight bearing     Mobility  Bed Mobility Overal bed mobility: Needs Assistance Bed Mobility: Sit to Supine       Sit to supine: Contact guard assist   General bed mobility comments: min cues and bed flat    Transfers Overall transfer level: Needs assistance Equipment used: Rolling walker (2 wheels) (knee walker support R LE) Transfers: Sit to/from Stand, Bed to chair/wheelchair/BSC Sit to Stand: +2 physical assistance, +2 safety/equipment, Contact guard assist, Min assist Stand pivot transfers: +2 physical assistance, +2 safety/equipment, Min assist, Contact guard assist         General transfer comment: pt required cues and min/XCGA A x 2 pt exhibited abiltiy to power up, push with B UE from recliner and obtain extension posture with B UE support at [facility] with R knee walker support, pt instructed to stand with R foot placed on PTs and then guided through transitioning R LE onto knee support with multimodal cues, pt demonstrated increased stability, pt exhibited improved abiltiy to recall and observe R LE NWB status today, pt progressing with IND and safety with R  knee support on RW, once standing infront of recliner pt able to perform hop to like gait pattern anteriorly and retrograd ~ 2 feet with min/CGA A x 2 pt required specific cues for removing R UE from knee support and transitionoing R foot back to therapists foot pt  able to perform STS and short amb bout x 2 with pt reporting SOB with signs and symptoms, cues for pursed lip breathing and O2 saturation decreasing to 88% on RA quickly recovering once seated and nursing made aware s/p 2 x STS pt able to complete SPT recliner to bed with use of RW with knee support and CGA/min A x 2 and cues pt demonstrating improved retrograde stepping pattern and ability to transition R LE from PTs foot to knee support    Ambulation/Gait               General Gait Details: NT   Stairs             Wheelchair Mobility     Tilt Bed    Modified Rankin (Stroke Patients Only)       Balance Overall balance assessment: History of Falls, Needs assistance Sitting-balance support: Feet supported, Single extremity supported Sitting balance-Leahy Scale: Good     Standing balance support: Bilateral upper extremity supported, During functional activity, Reliant on assistive device for balance Standing balance-Leahy Scale: Poor                              Communication Communication Communication: No apparent difficulties  Cognition Arousal: Alert Behavior During Therapy: Anxious, Impulsive   PT - Cognitive impairments: No apparent impairments                         Following commands: Intact      Cueing Cueing Techniques: Verbal cues, Visual cues, Tactile cues  Exercises      General Comments General comments (skin integrity, edema, etc.): cues for pursed lip breathing and slowing RR with O2 saturation breifly decreasing to 88% with exertion      Pertinent Vitals/Pain Pain Assessment Pain Assessment: Faces Faces Pain Scale: Hurts a little bit Pain Location: R distal LE Pain Descriptors / Indicators: Aching, Constant, Discomfort, Grimacing, Guarding    Home Living                          Prior Function            PT Goals (current goals can now be found in the care plan section) Acute Rehab PT  Goals Patient Stated Goal: to be able to go home PT Goal Formulation: With patient Time For Goal Achievement: 08/28/24 Potential to Achieve Goals: Fair Progress towards PT goals: Progressing toward goals    Frequency    Min 3X/week      PT Plan      Co-evaluation              AM-PAC PT 6 Clicks Mobility   Outcome Measure  Help needed turning from your back to your side while in a flat bed without using bedrails?: A Little Help needed moving from lying on your back to sitting on the side of a flat bed without using bedrails?: A Little Help needed moving to and from a bed to a chair (including a wheelchair)?: A Lot Help needed standing up from a chair using your arms (e.g.,  wheelchair or bedside chair)?: A Lot Help needed to walk in hospital room?: Total Help needed climbing 3-5 steps with a railing? : Total 6 Click Score: 12    End of Session Equipment Utilized During Treatment: Gait belt Activity Tolerance: No increased pain;Patient limited by fatigue Patient left: in bed;with call bell/phone within reach;with nursing/sitter in room;with family/visitor present Nurse Communication: Mobility status PT Visit Diagnosis: Unsteadiness on feet (R26.81);Other abnormalities of gait and mobility (R26.89);Muscle weakness (generalized) (M62.81);History of falling (Z91.81);Difficulty in walking, not elsewhere classified (R26.2);Pain Pain - Right/Left: Right Pain - part of body: Leg;Ankle and joints of foot     Time: 8378-8359 PT Time Calculation (min) (ACUTE ONLY): 19 min  Charges:    $Therapeutic Activity: 8-22 mins PT General Charges $$ ACUTE PT VISIT: 1 Visit                     [place], PT Acute Rehab    [place] VEAR Drone 08/19/2024, 5:40 PM

## 2024-08-19 NOTE — Progress Notes (Signed)
 PROGRESS NOTE    [name]  FMW:[id number] DOB: [birth year] DOA: 08/11/2024 PCP: [name], MD   Brief Narrative:  68 y.o. male with medical history significant of anxiety, depression, schizoaffective disorder, bowel obstruction, chronic anemia, colon polyps, constipation, GERD, type 2 diabetes, hyperlipidemia, hypertension, memory loss, MRSA carrier, history of pneumonia, seizure disorder, skin cancer who was brought to the emergency department after he had a fall.  Per patient, he stood up too fast, felt dizzy and then fell down.  He hit his head, but stated not very hard.  He did not lose consciousness.  Imaging studies revealed right ankle fracture.  He was hospitalized for further management.   Assessment & Plan:   Principal Problem:   Closed right ankle fracture Active Problems:   Hyperlipidemia   Seizure disorder (HCC)   Constipation, chronic   Essential hypertension   Mild sleep apnea   DM (diabetes mellitus), type 2 (HCC)   Class 1 obesity   Grade I diastolic dysfunction   Hyponatremia   Acute respiratory failure with hypoxia/aspiration pneumonia Postoperatively the patient could not be weaned off of oxygen.  Chest x-ray suggested left lung infiltrate.  It is quite possible patient may have aspirated.  Recently done echocardiogram showed normal LVEF with grade 1 diastolic dysfunction. Patient was started on antibiotics.  Was also given a dose of furosemide . Oxygenation has improved though he remains on 1 to 2 L of oxygen by nasal cannula.  Probably due to his sleep apnea. He was changed over to oral antibiotics.  Will give him a total of 7-day course.  Try to wean off oxygen to maintain saturations greater than 90%.   Right ankle fracture Patient seen by orthopedics and underwent surgery on 8/28.  Pain seems to be reasonably well-controlled.  PT OT eval. SNF is recommended for short-term rehab.   Acute urinary retention Foley catheter had to be placed after  multiple In-N-Out catheterizations.  He is on Flomax .  Retention is probably due to immobilization and constipation.  UA reviewed.  Not really significant for UTI.  He is already on antibiotics. Voiding trial needs to be done once he is more ambulatory.  May need to be done at urology office.     Constipation Has not had a bowel movement in several days.  This is despite laxatives.  Suppository was ordered yesterday but looks like patient refused.  Will order Fleet enema today.  He was told about the importance of having a BM.  Check TSH.   Hyponatremia Likely due to mild hypovolemia.  Has improved with IV hydration.   Grade 1 diastolic dysfunction/history of mitral regurgitation Stable from a cardiac standpoint.  Echocardiogram shows LVEF of 50 to 55%.  Grade 1 diastolic dysfunction noted.  No significant valvular abnormalities. See above regarding respiratory failure.   Volume status is stable.   Essential hypertension Metoprolol  was held due to soft blood pressures.  Blood pressures appear to have rebounded.  Metoprolol  resumed at a lower dose.  Blood pressure is stable.   Normocytic anemia Mild drop in hemoglobin is most likely dilutional.  Subsequently stable.  Checking labs periodically.   Hyperlipidemia Continue statin.   Seizure disorder Continue lacosamide .   Chronic constipation Continue bowel regimen.   Diabetes mellitus type 2 Noted to be on metformin  which is being continued.  SSI.  Monitor CBGs. HbA1c is 6.9.   Sleep apnea CPAP.  Requiring oxygen at low-dose.   Class I obesity Estimated body mass index is  32.82 kg/m as calculated from the following:   Height as of this encounter: 5' 9 (1.753 m).   Weight as of this encounter: 100.8 kg.    Estimated body mass index is 32.82 kg/m as calculated from the following:   Height as of this encounter: 5' 9 (1.753 m).   Weight as of this encounter: 100.8 kg.  DVT prophylaxis: Lovenox   code Status: Full full code   family Communication: None await Disposition Plan:  Status is: Await safe disposition Consultants: Ortho Procedures: ORIF right ankle Antimicrobials: None  Subjective:  Complains of constipation not had a BM in 7 days Objective: Vitals:   08/18/24 1136 08/18/24 1337 08/18/24 2351 08/19/24 0555  BP: 133/75 (!) 158/82 124/62 132/77  Pulse: 88 86 80 81  Resp:  20 18 18   Temp:  98.4 F (36.9 C) 98.9 F (37.2 C) 97.8 F (36.6 C)  TempSrc:  Oral Oral Oral  SpO2: 92% 94% 91% 94%  Weight:      Height:        Intake/Output Summary (Last 24 hours) at 08/19/2024 1544 Last data filed at 08/19/2024 1100 Gross per 24 hour  Intake 420 ml  Output 1025 ml  Net -605 ml   Filed Weights   08/11/24 0857 08/11/24 1259 08/13/24 1843  Weight: 99.8 kg 99.8 kg 100.8 kg    Examination:  General exam: Appears in no acute distress Respiratory system: Clear to auscultation. Respiratory effort normal. Cardiovascular system: regular  gastrointestinal system: Abdomen is nondistended, soft and nontender. No organomegaly or masses felt. Normal bowel sounds heard. Central nervous system: Alert and oriented. No focal neurological deficits. Extremities: right ankle Covered with dressing    Data Reviewed: I have personally reviewed following labs and imaging studies  CBC: Recent Labs  Lab 08/13/24 2122 08/14/24 0310 08/15/24 0347 08/17/24 0345  WBC 12.1* 10.8* 7.8 8.0  HGB 13.9 12.8* 12.0* 12.1*  HCT 39.4 37.4* 35.6* 35.8*  MCV 90.6 92.1 94.7 95.0  PLT 186 186 179 197   Basic Metabolic Panel: Recent Labs  Lab 08/14/24 0310 08/15/24 0347 08/17/24 0345 08/19/24 0348  NA 136 137 138 138  K 4.3 3.7 4.1 4.0  CL 101 102 102 102  CO2 18* 23 24 24   GLUCOSE 131* 136* 125* 133*  BUN 13 17 17 13   CREATININE 0.96 0.92 0.84 0.87  CALCIUM  9.2 8.7* 9.1 9.3  MG 2.4  --   --  2.4   GFR: Estimated Creatinine Clearance: 92.4 mL/min (by C-G formula based on SCr of 0.87 mg/dL). Liver Function  Tests: Recent Labs  Lab 08/14/24 0310  AST 31  ALT 35  ALKPHOS 84  BILITOT 0.5  PROT 6.4*  ALBUMIN  3.7   No results for input(s): LIPASE, AMYLASE in the last 168 hours. No results for input(s): AMMONIA in the last 168 hours. Coagulation Profile: No results for input(s): INR, PROTIME in the last 168 hours. Cardiac Enzymes: No results for input(s): CKTOTAL, CKMB, CKMBINDEX, TROPONINI in the last 168 hours. BNP (last 3 results) No results for input(s): PROBNP in the last 8760 hours. HbA1C: No results for input(s): HGBA1C in the last 72 hours. CBG: Recent Labs  Lab 08/18/24 1246 08/18/24 1657 08/18/24 2203 08/19/24 0742 08/19/24 1158  GLUCAP 197* 158* 167* 130* 111*   Lipid Profile: No results for input(s): CHOL, HDL, LDLCALC, TRIG, CHOLHDL, LDLDIRECT in the last 72 hours. Thyroid Function Tests: Recent Labs    08/19/24 0348  TSH 2.730   Anemia Panel: No  results for input(s): VITAMINB12, FOLATE, FERRITIN, TIBC, IRON, RETICCTPCT in the last 72 hours. Sepsis Labs: Recent Labs  Lab 08/14/24 0310  PROCALCITON <0.10    Recent Results (from the past 240 hours)  Surgical PCR screen     Status: None   Collection Time: 08/12/24 10:36 PM   Specimen: Nasal Mucosa; Nasal Swab  Result Value Ref Range Status   MRSA, PCR NEGATIVE NEGATIVE Final   Staphylococcus aureus NEGATIVE NEGATIVE Final    Comment: (NOTE) The Xpert SA Assay (FDA approved for NASAL specimens in patients 73 years of age and older), is one component of a comprehensive surveillance program. It is not intended to diagnose infection nor to guide or monitor treatment. Performed at [facility], [address], [place], [place] [address]      Radiology Studies: No results found.   Scheduled Meds:  amitriptyline   200 mg Oral QHS   amoxicillin -clavulanate  1 tablet Oral Q12H   atorvastatin   40 mg Oral QHS   Chlorhexidine  Gluconate Cloth   6 each Topical Daily   enoxaparin  (LOVENOX ) injection  50 mg Subcutaneous Q24H   insulin  aspart  0-15 Units Subcutaneous TID WC   lacosamide   150 mg Oral BID   lactulose   20 g Oral BID   LORazepam   0.5 mg Intravenous Once   melatonin  3 mg Oral QHS   metFORMIN   1,000 mg Oral Q breakfast   metoprolol  succinate  50 mg Oral Daily   polyethylene glycol  17 g Oral BID   saccharomyces boulardii  250 mg Oral BID   senna-docusate  2 tablet Oral BID   SMOG  400 mL Rectal Once   tamsulosin   0.4 mg Oral Daily   Continuous Infusions:   LOS: 8 days    Almarie KANDICE Hoots, MD  08/19/2024, 3:44 PM

## 2024-08-19 NOTE — TOC PASRR Note (Signed)
 30 Day PASRR Note   Patient Details  Name: DANNI LEABO Date of Birth: 05-11-1956   Transition of Care [facility]) CM/SW Contact:    Alfonse JONELLE Rex, RN Phone Number: 08/19/2024, 2:21 PM  To Whom It May Concern:  Please be advised that this patient will require a short-term nursing home stay - anticipated 30 days or less for rehabilitation and strengthening.   The plan is for return home.

## 2024-08-19 NOTE — TOC Progression Note (Addendum)
 Transition of Care [facility]) - Progression Note    Patient Details  Name: [name] MRN: [id number] Date of Birth: 1956-02-26  Transition of Care [facility]) CM/SW Contact  Alfonse JONELLE Rex, RN Phone Number: 08/19/2024, 12:48 PM  Clinical Narrative:   Patient now with Medicaid Pending for primary insurance. Referral sent to [facility] in SNF [facility]. TOC management notified via email referral sent, await bed offer.   -2:23pm FL2 updated, Level 2 PASRR pending.       Barriers to Discharge: Continued Medical Work up               Expected Discharge Plan and Services                                               Social Drivers of Health (SDOH) Interventions SDOH Screenings   Food Insecurity: Food Insecurity Present (08/11/2024)  Housing: Low Risk  (08/11/2024)  Transportation Needs: Unmet Transportation Needs (08/11/2024)  Utilities: At Risk (08/11/2024)  Alcohol Screen: Low Risk  (08/04/2018)  Depression (PHQ2-9): High Risk (07/23/2024)  Financial Resource Strain: Low Risk  (08/04/2018)  Physical Activity: Inactive (08/04/2018)  Social Connections: Unknown (08/11/2024)  Stress: No Stress Concern Present (08/04/2018)  Tobacco Use: Low Risk  (08/13/2024)    Readmission Risk Interventions     No data to display

## 2024-08-19 NOTE — Plan of Care (Signed)
  Problem: Metabolic: Goal: Ability to maintain appropriate glucose levels will improve Outcome: Progressing   Problem: Safety: Goal: Ability to remain free from injury will improve Outcome: Progressing   Problem: Pain Managment: Goal: General experience of comfort will improve and/or be controlled Outcome: Progressing   Problem: Coping: Goal: Level of anxiety will decrease Outcome: Progressing

## 2024-08-19 NOTE — Progress Notes (Signed)
 Occupational Therapy Treatment Patient Details Name: [name] MRN: [id number] DOB: 1956/02/06 Today's Date: 08/19/2024   History of present illness [name] is a 68 y.o. male admitted 08/11/24 with dx R ankle fx with medical history significant of anxiety, depression, schizoaffective disorder, bowel obstruction, chronic anemia, colon polyps, constipation, GERD, type 2 diabetes, hyperlipidemia, hypertension, memory loss, MRSA carrier, history of pneumonia, seizure disorder, skin cancer who was brought to the emergency department after he had a fall.   OT comments  Patient progressing and showed improved ability to laterally scoot from EOB to drop arm with Max As of 1 person compared to previous session, when pt required assist of two to chair. Pt did need to be stopped from trying a stand or squat pivot as pt began to try and OT was concerned for pt's following of NWB to RLE, therefore, stuck to scoot to allow OT to both assist pt safely and block RLE.  Recommend +2 person visit for squat pivot training.  Patient remains limited by NWB to RLE, cognitive deficits, pain, generalized weakness and decreased activity tolerance along with deficits noted below. Pt continues to demonstrate good rehab potential and would benefit from continued skilled OT to increase safety and independence with ADLs and functional transfers to allow pt to return home safely and reduce caregiver burden and fall risk. Continued occupational therapy services after discharge from acute care from continued inpatient follow up therapy, <3 hours/day is recommended.        If plan is discharge home, recommend the following:  A lot of help with walking and/or transfers;A lot of help with bathing/dressing/bathroom;Assistance with cooking/housework;Assist for transportation   Equipment Recommendations   (defer to next LOC)    Recommendations for Other Services      Precautions / Restrictions Precautions Precautions:  Fall Recall of Precautions/Restrictions: Impaired Precaution/Restrictions Comments: PT able to stte NWB but has difficulty maintaining and needs RLE blocked from floor with transfer training. Restrictions Weight Bearing Restrictions Per Provider Order: Yes RLE Weight Bearing Per Provider Order: Non weight bearing       Mobility Bed Mobility Overal bed mobility: Needs Assistance Bed Mobility: Supine to Sit     Supine to sit: HOB elevated, Used rails, Contact guard          Transfers                         Balance Overall balance assessment: History of Falls, Needs assistance Sitting-balance support: Feet supported, Single extremity supported Sitting balance-Leahy Scale: Good     Standing balance support: Bilateral upper extremity supported, During functional activity, Reliant on assistive device for balance Standing balance-Leahy Scale: Poor Standing balance comment: Did not come to a full stand. Pt tried and did clear hips but helped back down due to concerns for NWB RLE non-compliance.                           ADL either performed or assessed with clinical judgement   ADL Overall ADL's : Needs assistance/impaired Eating/Feeding: Set up;Sitting   Grooming: Set up;Supervision/safety;Sitting;Oral care Grooming Details (indicate cue type and reason): Pt setup with oral care once in recliner.             Lower Body Dressing: Contact guard assist;Sitting/lateral leans Lower Body Dressing Details (indicate cue type and reason): Pt able to don LT shoe at EOB with modified figure 4 positioning and GA  for trunk balance/safety. Toilet Transfer: Requires drop arm;Maximal assistance;Cueing for safety;Cueing for sequencing Toilet Transfer Details (indicate cue type and reason): Pt performed Lateral scoot from EOB to drop-arm recliner with Max As and RLE blocked from floor. Despite instructions, pt initiatlly tried a squat pivot. Pt was stopped and assisted  with descnt back to EOB with instructions. PT needs +2 people for squat pivot due to pt's risk of WB on RLE. Therefore, pt had to scoot so OT could safely block RLE and assist pt at trunk.  Pt then able to follow cues well.   Toileting - Clothing Manipulation Details (indicate cue type and reason): Pt with foley cath.     Functional mobility during ADLs: Maximal assistance;Cueing for sequencing;Cueing for safety      Extremity/Trunk Assessment Upper Extremity Assessment Upper Extremity Assessment: Generalized weakness;Left hand dominant;LUE deficits/detail;RUE deficits/detail RUE Deficits / Details: AROM WFL. Functional grip strength LUE Deficits / Details: chronic shoulder AROM limitations. Functional grip strength            Vision Baseline Vision/History: 1 Wears glasses Vision Assessment?: No apparent visual deficits   Perception     Praxis     Communication Communication Communication: No apparent difficulties   Cognition Arousal: Alert Behavior During Therapy: Anxious, Impulsive Cognition: Cognition impaired   Orientation impairments: Situation Awareness: Intellectual awareness intact, Online awareness impaired   Attention impairment (select first level of impairment): Selective attention Executive functioning impairment (select all impairments): Reasoning, Problem solving OT - Cognition Comments: Pt with many questions re: Why can't I drive just because of my memory?  How long will Ibe in the Nursing Home? Can my medications give me memory loss?  OT answered as appropraite to role and deferred other questions to appropraite health care professionals.                 Following commands: Intact        Cueing   Cueing Techniques: Verbal cues, Visual cues, Tactile cues  Exercises Other Exercises Other Exercises: Pt provided with green (Med-heavy_ Theraband and shown how to use band for tricep strengthening on BUEs then to use LT hand to anchor band for  RT shoulder strengthening.  Pt completed 8 reps triceps and 5 reps shoulder FF and shoulder abduction exercises while in recliner. Pt cued to exercises when feeling rested and to take breaks when tired. Pt instructed to avoid pain with any exercises.    Shoulder Instructions       General Comments      Pertinent Vitals/ Pain       Pain Assessment Pain Assessment: No/denies pain  Home Living                                          Prior Functioning/Environment              Frequency  Min 2X/week        Progress Toward Goals  OT Goals(current goals can now be found in the care plan section)  Progress towards OT goals: Progressing toward goals  Acute Rehab OT Goals OT Goal Formulation: With patient Time For Goal Achievement: 08/28/24 Potential to Achieve Goals: Good  Plan      Co-evaluation                 AM-PAC OT 6 Clicks Daily Activity     Outcome Measure  Help from another person eating meals?: None Help from another person taking care of personal grooming?: A Little Help from another person toileting, which includes using toliet, bedpan, or urinal?: A Lot Help from another person bathing (including washing, rinsing, drying)?: A Lot Help from another person to put on and taking off regular upper body clothing?: A Little Help from another person to put on and taking off regular lower body clothing?: A Lot 6 Click Score: 16    End of Session Equipment Utilized During Treatment: Gait belt  OT Visit Diagnosis: Other abnormalities of gait and mobility (R26.89);History of falling (Z91.81);Pain;Muscle weakness (generalized) (M62.81);Other symptoms and signs involving cognitive function Pain - Right/Left: Right Pain - part of body: Leg   Activity Tolerance Patient limited by fatigue (I'm exhausted once in chair)   Patient Left in chair;with call bell/phone within reach;with chair alarm set   Nurse Communication Other (comment)  (Nursing notified that OT taking pt out to hallway in recliner as pt reported feeling claustrophobic and wanted to see more of hospital.)        Time: 1340-1416 OT Time Calculation (min): 36 min  Charges: OT General Charges $OT Visit: 1 Visit OT Treatments $Therapeutic Activity: 8-22 mins $Therapeutic Exercise: 8-22 mins  [place], OT Acute Rehab Services Office: [phone] 08/19/2024   Delon Falter 08/19/2024, 2:46 PM

## 2024-08-19 NOTE — [place] FL2 (Signed)
 [place]  MEDICAID FL2 LEVEL OF CARE FORM     IDENTIFICATION  Patient Name: [name] Birthdate: 1956/03/14 Sex: male Admission Date (Current Location): 08/11/2024  [facility] [place] and [place] Number:  Producer, television/film/video and Address:  [facility],  [address], [place] [address]      Provider Number: [id number]  Attending Physician Name and Address:  Will Almarie MATSU, MD  Relative Name and Phone Number:  Bernice Mems (Significant other)  [phone] (Home Phone)    Current Level of Care: Hospital Recommended Level of Care: Skilled Nursing Facility Prior Approval Number:    Date Approved/Denied:   PASRR Number: [id number]  Discharge Plan: SNF    Current Diagnoses: Patient Active Problem List   Diagnosis Date Noted   Closed right ankle fracture 08/11/2024   Class 1 obesity 08/11/2024   Grade I diastolic dysfunction 08/11/2024   Hyponatremia 08/11/2024   Routine adult health maintenance 11/20/2023   Eczema 11/19/2022   Thrush, oral 08/28/2022   Cerumen impaction 08/28/2022   Leukocytosis 02/08/2022   Abnormal colonoscopy 02/08/2022   Diabetic peripheral neuropathy associated with type 2 diabetes mellitus (HCC) 07/25/2021   Squamous cell cancer of skin of left forearm 06/27/2021   Cough 06/27/2021   DM (diabetes mellitus), type 2 (HCC) 06/27/2021   Ear itching 06/01/2021   Chronic insomnia 04/14/2020   Acute metabolic encephalopathy 01/24/2019   Encephalopathy acute 01/24/2019   Closed tibia fracture 11/25/2018   Fracture of tibial shaft, left, closed 11/25/2018   Unintentional weight loss 04/07/2018   Syncope and collapse 12/13/2017   Memory disorder 10/25/2017   Presbyacusia 07/26/2017   Palpitations 07/01/2017   Acute bronchiolitis due to human metapneumovirus    Mild sleep apnea 06/15/2016   Actinic keratosis 06/15/2016   Nocturnal dyspnea 02/03/2016   Skin lesion of face 02/03/2016   Paresthesia of both hands 09/16/2015   Phenytoin   toxicity    TIA (transient ischemic attack) 07/29/2015   Vertigo 07/29/2015   Schizoaffective disorder, depressive type with good prognostic features (HCC) 07/13/2015   Constipation    Verruca 05/18/2015   Seborrheic dermatitis of scalp 05/18/2015   MDD (major depressive disorder), recurrent severe, without psychosis (HCC)    OCD (obsessive compulsive disorder)    Seizures (HCC)    Schizoaffective disorder, depressive type (HCC)    Orthostatic hypotension    Fall    Seizure (HCC) 11/18/2014   Schizoaffective disorder, unspecified type (HCC)    Essential hypertension    Overdose of beta-adrenergic antagonist drug 10/22/2014   Overdose    Nocturnal enuresis 03/23/2014   Benzodiazepine dependence, continuous (HCC) 01/27/2014   Severe major depression (HCC) 01/21/2014   Frozen shoulder syndrome 09/20/2013   Somnolence 09/20/2013   Schizoaffective disorder (HCC) 09/20/2013   Tachycardia 02/05/2013   Presbyopia 04/25/2012   Androgen deficiency 09/07/2011   Constipation, chronic 09/07/2011   Other specified disorders of liver 12/30/2008   Overweight(278.02) 09/08/2008   Hyperlipidemia 01/28/2008   Seizure disorder (HCC) 01/28/2008   OSTEOPENIA 07/01/2007    Orientation RESPIRATION BLADDER Height & Weight     Self, Time, Situation, Place  Normal Continent, Indwelling catheter Weight: 100.8 kg Height:  5' 9 (175.3 cm)  BEHAVIORAL SYMPTOMS/MOOD NEUROLOGICAL BOWEL NUTRITION STATUS    Convulsions/Seizures Continent Diet (heart diet)  AMBULATORY STATUS COMMUNICATION OF NEEDS Skin   Limited Assist Verbally Other (Comment) (ecchymosis left arm and leg)  Personal Care Assistance Level of Assistance  Bathing, Feeding, Dressing Bathing Assistance: Limited assistance Feeding assistance: Limited assistance Dressing Assistance: Limited assistance     Functional Limitations Info  Sight, Hearing, Speech Sight Info: Impaired (eyeglassess) Hearing Info:  Adequate Speech Info: Adequate    SPECIAL CARE FACTORS FREQUENCY  PT (By licensed PT), OT (By licensed OT)     PT Frequency: 5x/wk OT Frequency: 5x/wk            Contractures Contractures Info: Not present    Additional Factors Info  Code Status, Allergies, Psychotropic Code Status Info: Full Code Allergies Info: Codeine, Sulfa Antibiotics, Serotonin Reuptake Inhibitors (Ssris) Psychotropic Info: Elavil  200mg  po QD         Current Medications (08/19/2024):  This is the current hospital active medication list Current Facility-Administered Medications  Medication Dose Route Frequency Provider Last Rate Last Admin   acetaminophen  (TYLENOL ) tablet 650 mg  650 mg Oral Q6H PRN [place], Jesse J, PA-C   650 mg at 08/15/24 2051   Or   acetaminophen  (TYLENOL ) suppository 650 mg  650 mg Rectal Q6H PRN [place], Jesse J, PA-C       amitriptyline  (ELAVIL ) tablet 200 mg  200 mg Oral QHS Jordan, Jesse J, PA-C   200 mg at 08/18/24 2151   amoxicillin -clavulanate (AUGMENTIN ) 875-125 MG per tablet 1 tablet  1 tablet Oral Q12H Krishnan, Gokul, MD   1 tablet at 08/19/24 9053   atorvastatin  (LIPITOR) tablet 40 mg  40 mg Oral QHS Jordan, Jesse J, PA-C   40 mg at 08/18/24 2152   Chlorhexidine  Gluconate Cloth 2 % PADS 6 each  6 each Topical Daily Krishnan, Gokul, MD   6 each at 08/19/24 0947   enoxaparin  (LOVENOX ) injection 50 mg  50 mg Subcutaneous Q24H Krishnan, Gokul, MD   50 mg at 08/19/24 0801   hydrOXYzine  (ATARAX ) tablet 25 mg  25 mg Oral Q6H PRN Chavez, Abigail, NP   25 mg at 08/19/24 0800   insulin  aspart (novoLOG ) injection 0-15 Units  0-15 Units Subcutaneous TID WC [place], Jesse J, PA-C   2 Units at 08/19/24 9245   ipratropium-albuterol  (DUONEB) 0.5-2.5 (3) MG/3ML nebulizer solution 3 mL  3 mL Nebulization Q4H PRN Andrez Chroman, NP   3 mL at 08/18/24 1042   lacosamide  (VIMPAT ) tablet 150 mg  150 mg Oral BID [place], Jesse J, PA-C   150 mg at 08/19/24 0800   LORazepam  (ATIVAN ) injection 0.5 mg   0.5 mg Intravenous Once Daniels, James K, NP       melatonin tablet 3 mg  3 mg Oral QHS Chavez, Abigail, NP   3 mg at 08/18/24 2152   metFORMIN  (GLUCOPHAGE -XR) 24 hr tablet 1,000 mg  1,000 mg Oral Q breakfast [place], Jesse J, PA-C   1,000 mg at 08/19/24 0801   metoprolol  succinate (TOPROL -XL) 24 hr tablet 50 mg  50 mg Oral Daily Krishnan, Gokul, MD   50 mg at 08/19/24 0800   ondansetron  (ZOFRAN ) tablet 4 mg  4 mg Oral Q6H PRN [place], Jesse J, PA-C       Or   ondansetron  (ZOFRAN ) injection 4 mg  4 mg Intravenous Q6H PRN [place], Jesse J, PA-C       Oral care mouth rinse  15 mL Mouth Rinse PRN Krishnan, Gokul, MD       oxyCODONE  (Oxy IR/ROXICODONE ) immediate release tablet 5 mg  5 mg Oral Q4H PRN [place], Jesse J, PA-C   5 mg at 08/17/24 1658  polyethylene glycol (MIRALAX  / GLYCOLAX ) packet 17 g  17 g Oral BID Krishnan, Gokul, MD   17 g at 08/19/24 0802   saccharomyces boulardii (FLORASTOR) capsule 250 mg  250 mg Oral BID Krishnan, Gokul, MD   250 mg at 08/19/24 0802   senna-docusate (Senokot-S) tablet 2 tablet  2 tablet Oral BID Krishnan, Gokul, MD   2 tablet at 08/19/24 0801   tamsulosin  (FLOMAX ) capsule 0.4 mg  0.4 mg Oral Daily Krishnan, Gokul, MD   0.4 mg at 08/19/24 0800     Discharge Medications: Please see discharge summary for a list of discharge medications.  Relevant Imaging Results:  Relevant Lab Results:   Additional Information SSN: [id number]  Alfonse JONELLE Rex, RN

## 2024-08-19 NOTE — Progress Notes (Signed)
   08/19/24 1941  BiPAP/CPAP/SIPAP  BiPAP/CPAP/SIPAP Pt Type Adult  Reason BIPAP/CPAP not in use Non-compliant (Pt refusing cpap for the night)

## 2024-08-19 NOTE — Progress Notes (Signed)
 PT Note  Patient Details Name: [name] MRN: [id number] DOB: 25-Feb-1956   Cancelled Treatment:    Reason Eval/Treat Not Completed: Other (comment). PT arrived 62 and OT present completing intervention and pt stated too fatigued to participate with PT at this time. PT to return if schedule allows. PT to continue to follow acutely.   [place], PT Acute Rehab   [place] VEAR Drone 08/19/2024, 3:00 PM

## 2024-08-20 LAB — GLUCOSE, CAPILLARY
Glucose-Capillary: 138 mg/dL — ABNORMAL HIGH (ref 70–99)
Glucose-Capillary: 162 mg/dL — ABNORMAL HIGH (ref 70–99)
Glucose-Capillary: 161 mg/dL — ABNORMAL HIGH (ref 70–99)
Glucose-Capillary: 157 mg/dL — ABNORMAL HIGH (ref 70–99)

## 2024-08-20 LAB — CREATININE, SERUM
Creatinine, Ser: 0.94 mg/dL (ref 0.61–1.24)
GFR, Estimated: >60 mL/min (ref >60)

## 2024-08-20 NOTE — Progress Notes (Signed)
 PROGRESS NOTE    [name]  FMW:[id number] DOB: 05-09-1956 DOA: 08/11/2024 PCP: [name], MD   Brief Narrative:  68 y.o. male with medical history significant of anxiety, depression, schizoaffective disorder, bowel obstruction, chronic anemia, colon polyps, constipation, GERD, type 2 diabetes, hyperlipidemia, hypertension, memory loss, MRSA carrier, history of pneumonia, seizure disorder, skin cancer who was brought to the emergency department after he had a fall.  Per patient, he stood up too fast, felt dizzy and then fell down.  He hit his head, but stated not very hard.  He did not lose consciousness.  Imaging studies revealed right ankle fracture.  He was hospitalized for further management.   Assessment & Plan:   Principal Problem:   Closed right ankle fracture Active Problems:   Hyperlipidemia   Seizure disorder (HCC)   Constipation, chronic   Essential hypertension   Mild sleep apnea   DM (diabetes mellitus), type 2 (HCC)   Class 1 obesity   Grade I diastolic dysfunction   Hyponatremia   Closed fracture of shaft of right fibula   Acute respiratory failure with hypoxia/aspiration pneumonia Postoperatively the patient could not be weaned off of oxygen.  Chest x-ray suggested left lung infiltrate.  It is quite possible patient may have aspirated.  Recently done echocardiogram showed normal LVEF with grade 1 diastolic dysfunction. Patient was started on antibiotics.  Was also given a dose of furosemide . Oxygenation has improved though he remains on 1 to 2 L of oxygen by nasal cannula.  Probably due to his sleep apnea. He was changed over to oral antibiotics.  Will give him a total of 7-day course.  Try to wean off oxygen to maintain saturations greater than 90%.   Right ankle fracture Patient seen by orthopedics and underwent surgery on 8/28.  Pain seems to be reasonably well-controlled.  PT OT eval. SNF is recommended for short-term rehab.   Acute urinary  retention Foley catheter had to be placed after multiple In-N-Out catheterizations.  He is on Flomax .  Retention is probably due to immobilization and constipation.  UA reviewed.  Not really significant for UTI.  He is already on antibiotics. Voiding trial needs to be done once he is more ambulatory.  May need to be done at urology office.     Constipation Has not had a bowel movement in several days.  This is despite laxatives.  Suppository was ordered yesterday but looks like patient refused.  Will order Fleet enema today.  He was told about the importance of having a BM.  Check TSH.   Hyponatremia Likely due to mild hypovolemia.  Has improved with IV hydration.   Grade 1 diastolic dysfunction/history of mitral regurgitation Stable from a cardiac standpoint.  Echocardiogram shows LVEF of 50 to 55%.  Grade 1 diastolic dysfunction noted.  No significant valvular abnormalities. See above regarding respiratory failure.   Volume status is stable.   Essential hypertension Metoprolol  was held due to soft blood pressures.  Blood pressures appear to have rebounded.  Metoprolol  resumed at a lower dose.  Blood pressure is stable.   Normocytic anemia Mild drop in hemoglobin is most likely dilutional.  Subsequently stable.  Checking labs periodically.   Hyperlipidemia Continue statin.   Seizure disorder Continue lacosamide .   Chronic constipation Continue bowel regimen.   Diabetes mellitus type 2 Noted to be on metformin  which is being continued.  SSI.  Monitor CBGs. HbA1c is 6.9.   Sleep apnea CPAP.  Requiring oxygen at low-dose.  Class I obesity Estimated body mass index is 32.82 kg/m as calculated from the following:   Height as of this encounter: 5' 9 (1.753 m).   Weight as of this encounter: 100.8 kg.    Estimated body mass index is 32.82 kg/m as calculated from the following:   Height as of this encounter: 5' 9 (1.753 m).   Weight as of this encounter: 100.8 kg.  DVT  prophylaxis: Lovenox   code Status: Full full code  family Communication: None await Disposition Plan:  Status is: Await safe disposition Consultants: Ortho Procedures: ORIF right ankle Antimicrobials: None  Subjective:   Had BM AFTER laxatives and stool softeners Objective: Vitals:   08/19/24 1551 08/19/24 2228 08/20/24 0632 08/20/24 1249  BP: (!) 141/71 130/70 123/61 (!) 112/58  Pulse: 75 75 80 74  Resp: 18 18 18 15   Temp: 98.7 F (37.1 C) 99.2 F (37.3 C) 98.9 F (37.2 C) 98.2 F (36.8 C)  TempSrc: Oral Oral Oral   SpO2: 92% 95% 92% 91%  Weight:      Height:        Intake/Output Summary (Last 24 hours) at 08/20/2024 1328 Last data filed at 08/20/2024 1303 Gross per 24 hour  Intake 840 ml  Output 2000 ml  Net -1160 ml   Filed Weights   08/11/24 0857 08/11/24 1259 08/13/24 1843  Weight: 99.8 kg 99.8 kg 100.8 kg    Examination:  General exam: Appears in no acute distress Respiratory system: Clear to auscultation. Respiratory effort normal. Cardiovascular system: regular  gastrointestinal system: Abdomen is nondistended, soft and nontender. No organomegaly or masses felt. Normal bowel sounds heard. Central nervous system: Alert and oriented. No focal neurological deficits. Extremities: right ankle Covered with dressing    Data Reviewed: I have personally reviewed following labs and imaging studies  CBC: Recent Labs  Lab 08/13/24 2122 08/14/24 0310 08/15/24 0347 08/17/24 0345  WBC 12.1* 10.8* 7.8 8.0  HGB 13.9 12.8* 12.0* 12.1*  HCT 39.4 37.4* 35.6* 35.8*  MCV 90.6 92.1 94.7 95.0  PLT 186 186 179 197   Basic Metabolic Panel: Recent Labs  Lab 08/14/24 0310 08/15/24 0347 08/17/24 0345 08/19/24 0348 08/20/24 0349  NA 136 137 138 138  --   K 4.3 3.7 4.1 4.0  --   CL 101 102 102 102  --   CO2 18* 23 24 24   --   GLUCOSE 131* 136* 125* 133*  --   BUN 13 17 17 13   --   CREATININE 0.96 0.92 0.84 0.87 0.94  CALCIUM  9.2 8.7* 9.1 9.3  --   MG 2.4  --    --  2.4  --    GFR: Estimated Creatinine Clearance: 85.5 mL/min (by C-G formula based on SCr of 0.94 mg/dL). Liver Function Tests: Recent Labs  Lab 08/14/24 0310  AST 31  ALT 35  ALKPHOS 84  BILITOT 0.5  PROT 6.4*  ALBUMIN  3.7   No results for input(s): LIPASE, AMYLASE in the last 168 hours. No results for input(s): AMMONIA in the last 168 hours. Coagulation Profile: No results for input(s): INR, PROTIME in the last 168 hours. Cardiac Enzymes: No results for input(s): CKTOTAL, CKMB, CKMBINDEX, TROPONINI in the last 168 hours. BNP (last 3 results) No results for input(s): PROBNP in the last 8760 hours. HbA1C: No results for input(s): HGBA1C in the last 72 hours. CBG: Recent Labs  Lab 08/19/24 1158 08/19/24 1743 08/19/24 2225 08/20/24 0807 08/20/24 1139  GLUCAP 111* 132* 138* 138* 162*  Lipid Profile: No results for input(s): CHOL, HDL, LDLCALC, TRIG, CHOLHDL, LDLDIRECT in the last 72 hours. Thyroid Function Tests: Recent Labs    08/19/24 0348  TSH 2.730   Anemia Panel: No results for input(s): VITAMINB12, FOLATE, FERRITIN, TIBC, IRON, RETICCTPCT in the last 72 hours. Sepsis Labs: Recent Labs  Lab 08/14/24 0310  PROCALCITON <0.10    Recent Results (from the past 240 hours)  Surgical PCR screen     Status: None   Collection Time: 08/12/24 10:36 PM   Specimen: Nasal Mucosa; Nasal Swab  Result Value Ref Range Status   MRSA, PCR NEGATIVE NEGATIVE Final   Staphylococcus aureus NEGATIVE NEGATIVE Final    Comment: (NOTE) The Xpert SA Assay (FDA approved for NASAL specimens in patients 16 years of age and older), is one component of a comprehensive surveillance program. It is not intended to diagnose infection nor to guide or monitor treatment. Performed at [facility], [address] [place], [place] [address]      Radiology Studies: No results found.   Scheduled Meds:  amitriptyline    200 mg Oral QHS   amoxicillin -clavulanate  1 tablet Oral Q12H   atorvastatin   40 mg Oral QHS   Chlorhexidine  Gluconate Cloth  6 each Topical Daily   enoxaparin  (LOVENOX ) injection  50 mg Subcutaneous Q24H   insulin  aspart  0-15 Units Subcutaneous TID WC   lacosamide   150 mg Oral BID   lactulose   20 g Oral BID   LORazepam   0.5 mg Intravenous Once   melatonin  3 mg Oral QHS   metFORMIN   1,000 mg Oral Q breakfast   metoprolol  succinate  50 mg Oral Daily   polyethylene glycol  17 g Oral BID   saccharomyces boulardii  250 mg Oral BID   senna-docusate  2 tablet Oral BID   tamsulosin   0.4 mg Oral Daily   Continuous Infusions:   LOS: 9 days    Almarie KANDICE Hoots, MD  08/20/2024, 1:28 PM

## 2024-08-20 NOTE — TOC Progression Note (Signed)
 Transition of Care [facility]) - Progression Note    Patient Details  Name: [name] MRN: [id number] Date of Birth: 1956-03-20  [facility] of [facility] CM/SW Contact  Alfonse JONELLE Rex, RN Phone Number: 08/20/2024, 10:04 AM  Clinical Narrative:  [facility] declined for SNF bed offer, bed offer fax extended, await bed offers.        Barriers to Discharge: Continued Medical Work up               Expected Discharge Plan and Services                                               Social Drivers of Health (SDOH) Interventions SDOH Screenings   Food Insecurity: Food Insecurity Present (08/11/2024)  Housing: Low Risk  (08/11/2024)  Transportation Needs: Unmet Transportation Needs (08/11/2024)  Utilities: At Risk (08/11/2024)  Alcohol Screen: Low Risk  (08/04/2018)  Depression (PHQ2-9): High Risk (07/23/2024)  Financial Resource Strain: Low Risk  (08/04/2018)  Physical Activity: Inactive (08/04/2018)  Social Connections: Unknown (08/11/2024)  Stress: No Stress Concern Present (08/04/2018)  Tobacco Use: Low Risk  (08/13/2024)    Readmission Risk Interventions     No data to display

## 2024-08-20 NOTE — Progress Notes (Signed)
   08/20/24 1956  BiPAP/CPAP/SIPAP  BiPAP/CPAP/SIPAP Pt Type Adult  Reason BIPAP/CPAP not in use Non-compliant (Patient refusing CPAP)

## 2024-08-20 NOTE — Plan of Care (Signed)
  Problem: Coping: Goal: Level of anxiety will decrease Outcome: Progressing   Problem: Pain Managment: Goal: General experience of comfort will improve and/or be controlled Outcome: Progressing   Problem: Safety: Goal: Ability to remain free from injury will improve Outcome: Progressing   Problem: Metabolic: Goal: Ability to maintain appropriate glucose levels will improve Outcome: Progressing

## 2024-08-20 NOTE — Progress Notes (Signed)
 Physical Therapy Treatment Patient Details Name: [name] MRN: [id number] DOB: 03/08/1956 Today's Date: 08/20/2024   History of Present Illness [name] is a 68 y.o. male admitted 08/11/24 with dx R ankle fx with medical history significant of anxiety, depression, schizoaffective disorder, bowel obstruction, chronic anemia, colon polyps, constipation, GERD, type 2 diabetes, hyperlipidemia, hypertension, memory loss, MRSA carrier, history of pneumonia, seizure disorder, skin cancer who was brought to the emergency department after he had a fall.    PT Comments  Pt in bed, agreeable to session. Denies pain at this time. He is able to come to sit EOB with use of handrails, good sitting balance. Requires mod A for Sit to Stand and min A for mobility in room. Cued for NWB RLE reminder and able to adhere during session. Progresses away from EOB using small steps and completes directional change with dynamic movements as LLE and BUE progress stability and strength to accommodate RLE limitations. Pt provided handout with step by step transfer and mobility instructions to assist with sequencing using RW with RLE knee support platform. Based on pt PLOF, level of support, and current functional status, pt will benefit from skilled inpatient physical therapy <3 hours per day at dc for safe progression of functional mobility.     If plan is discharge home, recommend the following: Two people to help with walking and/or transfers;A lot of help with bathing/dressing/bathroom;Assistance with cooking/housework;Assist for transportation;Help with stairs or ramp for entrance   Can travel by private vehicle     No  Equipment Recommendations  Other (comment) (TBD at next venue of care)    Recommendations for Other Services       Precautions / Restrictions Precautions Precautions: Fall Recall of Precautions/Restrictions: Impaired Precaution/Restrictions Comments: pt continues to require cues for R LE  NWB Restrictions Weight Bearing Restrictions Per Provider Order: Yes RLE Weight Bearing Per Provider Order: Non weight bearing     Mobility  Bed Mobility Overal bed mobility: Needs Assistance Bed Mobility: Sit to Supine     Supine to sit: HOB elevated, Used rails, Supervision Sit to supine: Supervision   General bed mobility comments: HOB elevated for supine to sit and HOB flat for sit to supine    Transfers Overall transfer level: Needs assistance Equipment used: Rolling walker (2 wheels) (R knee support attachment) Transfers: Sit to/from Stand Sit to Stand: Mod assist Stand pivot transfers: Mod assist              Ambulation/Gait Ambulation/Gait assistance: Min assist Gait Distance (Feet): 15 Feet Assistive device: Rolling walker (2 wheels) (R knee platform attachment)   Gait velocity: dec     General Gait Details: Pt amb using hop to pattern with RLE on support platform, cued to take small steps for maintaining position within walker BOS. LLE and BUE support progressing requiring turns to happen with quick dynamic movements, educated on taking more forward approach to mitigate risk of lateral pulling on walker. able to complete but SOB with this task   Stairs             Wheelchair Mobility     Tilt Bed    Modified Rankin (Stroke Patients Only)       Balance Overall balance assessment: History of Falls, Needs assistance Sitting-balance support: Feet supported, Single extremity supported Sitting balance-Leahy Scale: Good       Standing balance-Leahy Scale: Poor Standing balance comment: Able to stand, requires support to allow for transition of RLE into support  Communication Communication Communication: No apparent difficulties  Cognition Arousal: Alert Behavior During Therapy: Anxious, Impulsive   PT - Cognitive impairments: No apparent impairments                         Following  commands: Intact      Cueing Cueing Techniques: Verbal cues, Visual cues, Tactile cues, Gestural cues  Exercises      General Comments        Pertinent Vitals/Pain Pain Assessment Pain Assessment: No/denies pain Pain Intervention(s): Limited activity within patient's tolerance, Monitored during session    Home Living                          Prior Function            PT Goals (current goals can now be found in the care plan section) Acute Rehab PT Goals Patient Stated Goal: to be able to go home PT Goal Formulation: With patient Time For Goal Achievement: 08/28/24 Potential to Achieve Goals: Fair Progress towards PT goals: Progressing toward goals    Frequency    Min 3X/week      PT Plan      Co-evaluation              AM-PAC PT 6 Clicks Mobility   Outcome Measure  Help needed turning from your back to your side while in a flat bed without using bedrails?: A Little Help needed moving from lying on your back to sitting on the side of a flat bed without using bedrails?: A Little Help needed moving to and from a bed to a chair (including a wheelchair)?: A Lot Help needed standing up from a chair using your arms (e.g., wheelchair or bedside chair)?: A Lot Help needed to walk in hospital room?: A Little Help needed climbing 3-5 steps with a railing? : Total 6 Click Score: 14    End of Session Equipment Utilized During Treatment: Gait belt Activity Tolerance: No increased pain;Patient limited by fatigue Patient left: in bed;with call bell/phone within reach;with bed alarm set Nurse Communication: Mobility status PT Visit Diagnosis: Unsteadiness on feet (R26.81);Other abnormalities of gait and mobility (R26.89);Muscle weakness (generalized) (M62.81);History of falling (Z91.81);Difficulty in walking, not elsewhere classified (R26.2);Pain Pain - Right/Left: Right Pain - part of body: Leg;Ankle and joints of foot     Time: 8458-8375 PT Time  Calculation (min) (ACUTE ONLY): 43 min  Charges:    $Gait Training: 8-22 mins $Therapeutic Activity: 23-37 mins PT General Charges $$ ACUTE PT VISIT: 1 Visit                     [place], PT Acute Rehabilitation Services Office: [phone] 08/20/2024    Stann DELENA Ohara 08/20/2024, 4:38 PM

## 2024-08-21 DIAGNOSIS — S8251XA Displaced fracture of medial malleolus of right tibia, initial encounter for closed fracture: Principal | ICD-10-CM

## 2024-08-21 LAB — GLUCOSE, CAPILLARY
Glucose-Capillary: 136 mg/dL — ABNORMAL HIGH (ref 70–99)
Glucose-Capillary: 136 mg/dL — ABNORMAL HIGH (ref 70–99)
Glucose-Capillary: 164 mg/dL — ABNORMAL HIGH (ref 70–99)
Glucose-Capillary: 142 mg/dL — ABNORMAL HIGH (ref 70–99)

## 2024-08-21 MED ORDER — BISACODYL 5 MG PO TBEC
10.0000 mg | DELAYED_RELEASE_TABLET | Freq: Every day | ORAL | Status: DC
  Administered 2024-08-21 – 2024-08-31 (×10): 10 mg via ORAL
  Filled 2024-08-21 (×11): qty 2

## 2024-08-21 MED ORDER — NYSTATIN 100000 UNIT/ML MT SUSP
5.0000 mL | Freq: Four times a day (QID) | OROMUCOSAL | Status: DC
  Administered 2024-08-21 – 2024-08-31 (×35): 500000 [IU] via ORAL
  Filled 2024-08-21 (×34): qty 5

## 2024-08-21 MED ORDER — LORAZEPAM 0.5 MG PO TABS
0.5000 mg | ORAL_TABLET | Freq: Once | ORAL | Status: AC
  Administered 2024-08-21: 0.5 mg via ORAL
  Filled 2024-08-21: qty 1

## 2024-08-21 MED ORDER — FLUCONAZOLE 150 MG PO TABS
150.0000 mg | ORAL_TABLET | Freq: Once | ORAL | Status: AC
  Administered 2024-08-21: 150 mg via ORAL
  Filled 2024-08-21: qty 1

## 2024-08-21 NOTE — Plan of Care (Signed)
   Problem: Clinical Measurements: Goal: Ability to maintain clinical measurements within normal limits will improve Outcome: Progressing   Problem: Pain Managment: Goal: General experience of comfort will improve and/or be controlled Outcome: Progressing

## 2024-08-21 NOTE — Progress Notes (Signed)
 Physical Therapy Treatment Patient Details Name: [name] MRN: [id number] DOB: 08-18-1956 Today's Date: 08/21/2024   History of Present Illness [name] is a 68 y.o. male admitted 08/11/24 with dx R ankle fx with medical history significant of anxiety, depression, schizoaffective disorder, bowel obstruction, chronic anemia, colon polyps, constipation, GERD, type 2 diabetes, hyperlipidemia, hypertension, memory loss, MRSA carrier, history of pneumonia, seizure disorder, skin cancer who was brought to the emergency department after he had a fall.    PT Comments   Pt admitted with above diagnosis.  Pt currently with functional limitations due to the deficits listed below (see PT Problem List). Pt in bed when PT arrived. Pt agreeable to therapy intervention. Pt reports no pain today. Pt required S for supine to sit, min A x 2 for safety with SPT bed to wc pt demonstrating improved ability with recall and observation for R LE NWB and transitioning to knee support without requiring PT to place foot under pts, pt will require ongoing cues for transitioning UE to sitting surface for improved eccentric control to sitting surface. PT focus today on wc mobility and management with instruction provided on wc brake management pt able to perform return demonstration with locking and unlocking brakes, wc mobility in hallway 100 feet and pt required rest due to B UE fatigued and able to complete an additional 100 feet, pt required strong cues for obstacle navigation and retrograde propulsion in wc. Pt left seated in wc, wc locked, all needs in reach, nurse aware and pt encouraged to self propel in hallway on unit later in the afternoon. Pt reported that he no longer wants to go to a SNF for rehab, PT continues to recommend due to required physical assist and need for 24/7 supervision for safety. If pt is to d/c home pt will require 24/7 supervision and assist, BSC, RW with knee support, standard wc with cushion, B  elevating leg rests and brake extenders. Pt Pt will benefit from acute skilled PT to increase their independence and safety with mobility to allow discharge.      If plan is discharge home, recommend the following: Two people to help with walking and/or transfers;A lot of help with bathing/dressing/bathroom;Assistance with cooking/housework;Assist for transportation;Help with stairs or ramp for entrance   Can travel by private vehicle     No  Equipment Recommendations  Other (comment) (TBD at next venue of care)    Recommendations for Other Services       Precautions / Restrictions Precautions Precautions: Fall Recall of Precautions/Restrictions: Impaired Precaution/Restrictions Comments: pt continues to require cues for R LE NWB Restrictions Weight Bearing Restrictions Per Provider Order: Yes RLE Weight Bearing Per Provider Order: Non weight bearing     Mobility  Bed Mobility Overal bed mobility: Needs Assistance Bed Mobility: Sit to Supine     Supine to sit: HOB elevated, Used rails, Supervision     General bed mobility comments: min cues and increased time pt demonstrating improved recall and observation for R LE NWB    Transfers Overall transfer level: Needs assistance Equipment used: Rolling walker (2 wheels) (R knee support attachment) Transfers: Sit to/from Stand Sit to Stand: Min assist, +2 safety/equipment           General transfer comment: pt required cues and min/XCGA A x 2 pt exhibited abiltiy to power up, push with B UE from EOB and obtain extension posture with B UE support at RW and R knee walker support, pt instructed to stand  with R LE NWB pt guided through transitioning R LE onto knee support with multimodal cues, pt demonstrated increased stability, pt exhibited improved abiltiy to recall and observe R LE NWB status today, pt progressing with IND and safety with R knee support on RW. pt able to complete SPC bed to wc with mild instability noted cues  for transitioning B UE from RW to wc for improved eccentric control    Ambulation/Gait                   Psychologist, counselling mobility: Yes Wheelchair propulsion: Both upper extremities Wheelchair parts: Needs assistance Distance: 100 Wheelchair Assistance Details (indicate cue type and reason): min A initally and strong verbal cues for use of B UE to propel wc and for obstacle navigation in personal room and in hallway as well as manuvering wc backward   Tilt Bed    Modified Rankin (Stroke Patients Only)       Balance Overall balance assessment: History of Falls, Needs assistance Sitting-balance support: Feet supported, Single extremity supported Sitting balance-Leahy Scale: Good     Standing balance support: Bilateral upper extremity supported, During functional activity, Reliant on assistive device for balance Standing balance-Leahy Scale: Poor Standing balance comment: pt requires B UE support and CGA to min A to maintain static standing                            Communication Communication Communication: No apparent difficulties  Cognition Arousal: Alert Behavior During Therapy: Anxious, Impulsive   PT - Cognitive impairments: No apparent impairments                         Following commands: Intact      Cueing Cueing Techniques: Verbal cues, Visual cues, Tactile cues, Gestural cues  Exercises      General Comments General comments (skin integrity, edema, etc.): cues for pursed lip breathing decreased wheezing noted and no reports of SOB      Pertinent Vitals/Pain Pain Assessment Pain Assessment: No/denies pain (pt states no pain today, nurse reports pt has not requested any pain medication today)    Home Living                          Prior Function            PT Goals (current goals can now be found in the care plan section) Acute Rehab PT  Goals Patient Stated Goal: to be able to go home PT Goal Formulation: With patient Time For Goal Achievement: 08/28/24 Potential to Achieve Goals: Fair Progress towards PT goals: Progressing toward goals    Frequency    Min 3X/week      PT Plan      Co-evaluation              AM-PAC PT 6 Clicks Mobility   Outcome Measure  Help needed turning from your back to your side while in a flat bed without using bedrails?: A Little Help needed moving from lying on your back to sitting on the side of a flat bed without using bedrails?: A Little Help needed moving to and from a bed to a chair (including a wheelchair)?: A Lot Help needed standing up from a chair using your arms (e.g., wheelchair or bedside  chair)?: A Lot Help needed to walk in hospital room?: A Little Help needed climbing 3-5 steps with a railing? : Total 6 Click Score: 14    End of Session Equipment Utilized During Treatment: Gait belt Activity Tolerance: No increased pain;Patient tolerated treatment well Patient left: with call bell/phone within reach;Other (comment) (wc) Nurse Communication: Mobility status PT Visit Diagnosis: Unsteadiness on feet (R26.81);Other abnormalities of gait and mobility (R26.89);Muscle weakness (generalized) (M62.81);History of falling (Z91.81);Difficulty in walking, not elsewhere classified (R26.2);Pain Pain - Right/Left: Right Pain - part of body: Leg;Ankle and joints of foot     Time: 1530-1554 PT Time Calculation (min) (ACUTE ONLY): 24 min  Charges:    $Therapeutic Activity: 8-22 mins $Wheel Chair Management: 8-22 mins PT General Charges $$ ACUTE PT VISIT: 1 Visit                     [place], PT Acute Rehab    [place] VEAR Drone 08/21/2024, 5:09 PM

## 2024-08-21 NOTE — Progress Notes (Signed)
 PROGRESS NOTE    [name]  FMW:[id number] DOB: 05-07-1956 DOA: 08/11/2024 PCP: [name], MD   Brief Narrative:  68 y.o. male with medical history significant of anxiety, depression, schizoaffective disorder, bowel obstruction, chronic anemia, colon polyps, constipation, GERD, type 2 diabetes, hyperlipidemia, hypertension, memory loss, MRSA carrier, history of pneumonia, seizure disorder, skin cancer who was brought to the emergency department after he had a fall.  Per patient, he stood up too fast, felt dizzy and then fell down.  He hit his head, but stated not very hard.  He did not lose consciousness.  Imaging studies revealed right ankle fracture.  He was hospitalized for further management.   Assessment & Plan:   Principal Problem:   Closed right ankle fracture Active Problems:   Hyperlipidemia   Seizure disorder (HCC)   Constipation, chronic   Essential hypertension   Mild sleep apnea   DM (diabetes mellitus), type 2 (HCC)   Class 1 obesity   Grade I diastolic dysfunction   Hyponatremia   Closed fracture of shaft of right fibula   Acute respiratory failure with hypoxia/aspiration pneumonia Postoperatively the patient could not be weaned off of oxygen.  Chest x-ray suggested left lung infiltrate.  It is quite possible patient may have aspirated.  Recently done echocardiogram showed normal LVEF with grade 1 diastolic dysfunction. Patient was started on antibiotics.  Was also given a dose of furosemide . Oxygenation has improved though he remains on 1 to 2 L of oxygen by nasal cannula.  Probably due to his sleep apnea. He was changed over to oral antibiotics.  Will give him a total of 7-day course.  Try to wean off oxygen to maintain saturations greater than 90%.   Right ankle fracture Patient seen by orthopedics and underwent surgery on 8/28.  Pain seems to be reasonably well-controlled.  PT OT eval. SNF is recommended for short-term rehab.   Acute urinary  retention-likely due to constipation and immobilization.  Continue Flomax .  A Foley catheter was placed after multiple In-N-Out catheterization.  UA showed no evidence of UTI he was already on antibiotics. Will try DC Foley 08/21/2024 and encouraged him to urinate in the urinal.  He had multiple bowel movements.  Need to make sure he is on bowel regime.     Constipation Has not had a bowel movement in several days.  This is despite laxatives.  Suppository was ordered yesterday but looks like patient refused.  Will order Fleet enema today.  He was told about the importance of having a BM.  Check TSH.   Hyponatremia Likely due to mild hypovolemia.  Has improved with IV hydration.   Grade 1 diastolic dysfunction/history of mitral regurgitation Stable from a cardiac standpoint.  Echocardiogram shows LVEF of 50 to 55%.  Grade 1 diastolic dysfunction noted.  No significant valvular abnormalities. See above regarding respiratory failure.   Volume status is stable.   Essential hypertension Metoprolol  was held due to soft blood pressures.  Blood pressures appear to have rebounded.  Metoprolol  resumed at a lower dose.  Blood pressure is stable.   Normocytic anemia Mild drop in hemoglobin is most likely dilutional.  Subsequently stable.  Checking labs periodically.   Hyperlipidemia Continue statin.   Seizure disorder Continue lacosamide .   Chronic constipation Continue bowel regimen.   Diabetes mellitus type 2 Noted to be on metformin  which is being continued.  SSI.  Monitor CBGs. HbA1c is 6.9.   Sleep apnea CPAP.  Requiring oxygen at low-dose.  Class I obesity Estimated body mass index is 32.82 kg/m as calculated from the following:   Height as of this encounter: 5' 9 (1.753 m).   Weight as of this encounter: 100.8 kg.    Estimated body mass index is 32.82 kg/m as calculated from the following:   Height as of this encounter: 5' 9 (1.753 m).   Weight as of this encounter: 100.8  kg.  DVT prophylaxis: Lovenox   code Status: Full full code  family Communication: None  Disposition Plan:  Status is: Await safe disposition Consultants: Ortho Procedures: ORIF right ankle Antimicrobials: None  Subjective:  Little anxious today does not have insurance cannot find a SNF do not have the resources to go private pay with the SNF PT recommends SNF nonweightbearing to right lower extremity.  Foley still in place. Objective: Vitals:   08/20/24 0632 08/20/24 1249 08/21/24 0648 08/21/24 1318  BP: 123/61 (!) 112/58 136/89 132/79  Pulse: 80 74 76 79  Resp: 18 15 18 16   Temp: 98.9 F (37.2 C) 98.2 F (36.8 C) (!) 97.5 F (36.4 C) 99.3 F (37.4 C)  TempSrc: Oral     SpO2: 92% 91% 93% 94%  Weight:      Height:        Intake/Output Summary (Last 24 hours) at 08/21/2024 1419 Last data filed at 08/21/2024 1320 Gross per 24 hour  Intake 1080 ml  Output 1875 ml  Net -795 ml   Filed Weights   08/11/24 0857 08/11/24 1259 08/13/24 1843  Weight: 99.8 kg 99.8 kg 100.8 kg    Examination:  General exam: Appears in no acute distress Respiratory system: Clear to auscultation. Respiratory effort normal. Cardiovascular system: regular  gastrointestinal system: Abdomen is nondistended, soft and nontender. No organomegaly or masses felt. Normal bowel sounds heard. Central nervous system: Alert and oriented. No focal neurological deficits. Extremities: right ankle Covered with dressing    Data Reviewed: I have personally reviewed following labs and imaging studies  CBC: Recent Labs  Lab 08/15/24 0347 08/17/24 0345  WBC 7.8 8.0  HGB 12.0* 12.1*  HCT 35.6* 35.8*  MCV 94.7 95.0  PLT 179 197   Basic Metabolic Panel: Recent Labs  Lab 08/15/24 0347 08/17/24 0345 08/19/24 0348 08/20/24 0349  NA 137 138 138  --   K 3.7 4.1 4.0  --   CL 102 102 102  --   CO2 23 24 24   --   GLUCOSE 136* 125* 133*  --   BUN 17 17 13   --   CREATININE 0.92 0.84 0.87 0.94  CALCIUM  8.7*  9.1 9.3  --   MG  --   --  2.4  --    GFR: Estimated Creatinine Clearance: 85.5 mL/min (by C-G formula based on SCr of 0.94 mg/dL). Liver Function Tests: No results for input(s): AST, ALT, ALKPHOS, BILITOT, PROT, ALBUMIN  in the last 168 hours.  No results for input(s): LIPASE, AMYLASE in the last 168 hours. No results for input(s): AMMONIA in the last 168 hours. Coagulation Profile: No results for input(s): INR, PROTIME in the last 168 hours. Cardiac Enzymes: No results for input(s): CKTOTAL, CKMB, CKMBINDEX, TROPONINI in the last 168 hours. BNP (last 3 results) No results for input(s): PROBNP in the last 8760 hours. HbA1C: No results for input(s): HGBA1C in the last 72 hours. CBG: Recent Labs  Lab 08/20/24 1139 08/20/24 1655 08/20/24 2146 08/21/24 0727 08/21/24 1120  GLUCAP 162* 161* 157* 136* 136*   Lipid Profile: No results for  input(s): CHOL, HDL, LDLCALC, TRIG, CHOLHDL, LDLDIRECT in the last 72 hours. Thyroid Function Tests: Recent Labs    08/19/24 0348  TSH 2.730   Anemia Panel: No results for input(s): VITAMINB12, FOLATE, FERRITIN, TIBC, IRON, RETICCTPCT in the last 72 hours. Sepsis Labs: No results for input(s): PROCALCITON, LATICACIDVEN in the last 168 hours.   Recent Results (from the past 240 hours)  Surgical PCR screen     Status: None   Collection Time: 08/12/24 10:36 PM   Specimen: Nasal Mucosa; Nasal Swab  Result Value Ref Range Status   MRSA, PCR NEGATIVE NEGATIVE Final   Staphylococcus aureus NEGATIVE NEGATIVE Final    Comment: (NOTE) The Xpert SA Assay (FDA approved for NASAL specimens in patients 29 years of age and older), is one component of a comprehensive surveillance program. It is not intended to diagnose infection nor to guide or monitor treatment. Performed at [facility], [address] [place], [place] [address]      Radiology Studies: No results  found.   Scheduled Meds:  amitriptyline   200 mg Oral QHS   atorvastatin   40 mg Oral QHS   Chlorhexidine  Gluconate Cloth  6 each Topical Daily   enoxaparin  (LOVENOX ) injection  50 mg Subcutaneous Q24H   insulin  aspart  0-15 Units Subcutaneous TID WC   lacosamide   150 mg Oral BID   LORazepam   0.5 mg Intravenous Once   melatonin  3 mg Oral QHS   metFORMIN   1,000 mg Oral Q breakfast   metoprolol  succinate  50 mg Oral Daily   polyethylene glycol  17 g Oral BID   saccharomyces boulardii  250 mg Oral BID   senna-docusate  2 tablet Oral BID   tamsulosin   0.4 mg Oral Daily   Continuous Infusions:   LOS: 10 days    Almarie KANDICE Hoots, MD  08/21/2024, 2:19 PM

## 2024-08-21 NOTE — TOC Progression Note (Addendum)
 Transition of Care [facility]) - Progression Note    Patient Details  Name: [name] MRN: [id number] Date of Birth: 04/02/1956  Transition [facility] CM/SW Contact  Alfonse JONELLE Rex, RN Phone Number: 08/21/2024, 1:51 PM  Clinical Narrative:  Met with patient and his significant other, [name], at bedside to review dc planning, PT recommendation for short term rehab/SNF, no current bed offers, [name] confirmed they do not have resources for private pay for SNF. Patient inquired about dc home with [facility] PT/OT. Discussed with patient that I will send out request for [facility] services but may be difficult due to payor source, pt voiced understanding .NCM provided [name] with a list of Private Duty Care agencies per her request, states she will review list for possible private duty care . TOC will follow up.     -2:00pm [facility], accepted referral if patient dc home with [facility] services.       Barriers to Discharge: Continued Medical Work up               Expected Discharge Plan and Services                                               Social Drivers of Health (SDOH) Interventions SDOH Screenings   Food Insecurity: Food Insecurity Present (08/11/2024)  Housing: Low Risk  (08/11/2024)  Transportation Needs: Unmet Transportation Needs (08/11/2024)  Utilities: At Risk (08/11/2024)  Alcohol Screen: Low Risk  (08/04/2018)  Depression (PHQ2-9): High Risk (07/23/2024)  Financial Resource Strain: Low Risk  (08/04/2018)  Physical Activity: Inactive (08/04/2018)  Social Connections: Unknown (08/11/2024)  Stress: No Stress Concern Present (08/04/2018)  Tobacco Use: Low Risk  (08/13/2024)    Readmission Risk Interventions    08/21/2024    1:50 PM  Readmission Risk Prevention Plan  Transportation Screening Complete  PCP or Specialist Appt within 3-5 Days Complete  HRI or Home Care Consult Complete  Social Work Consult for Recovery Care Planning/Counseling Complete  Palliative Care  Screening Not Applicable  Medication Review Oceanographer) Complete

## 2024-08-21 NOTE — Plan of Care (Signed)

## 2024-08-21 NOTE — Progress Notes (Signed)
   08/21/24 2304  BiPAP/CPAP/SIPAP  BiPAP/CPAP/SIPAP Pt Type Adult  Reason BIPAP/CPAP not in use Non-compliant (Patient does not want to use CPAP at this time.)

## 2024-08-22 LAB — GLUCOSE, CAPILLARY
Glucose-Capillary: 141 mg/dL — ABNORMAL HIGH (ref 70–99)
Glucose-Capillary: 178 mg/dL — ABNORMAL HIGH (ref 70–99)
Glucose-Capillary: 135 mg/dL — ABNORMAL HIGH (ref 70–99)
Glucose-Capillary: 163 mg/dL — ABNORMAL HIGH (ref 70–99)

## 2024-08-22 LAB — COMPREHENSIVE METABOLIC PANEL WITH GFR
ALT: 40 U/L (ref 0–44)
AST: 37 U/L (ref 15–41)
Albumin: 3.9 g/dL (ref 3.5–5.0)
Alkaline Phosphatase: 105 U/L (ref 38–126)
Anion gap: 14 (ref 5–15)
BUN: 19 mg/dL (ref 8–23)
CO2: 21 mmol/L — ABNORMAL LOW (ref 22–32)
Calcium: 9.4 mg/dL (ref 8.9–10.3)
Chloride: 102 mmol/L (ref 98–111)
Creatinine, Ser: 0.97 mg/dL (ref 0.61–1.24)
GFR, Estimated: >60 mL/min (ref >60)
Glucose, Bld: 122 mg/dL — ABNORMAL HIGH (ref 70–99)
Potassium: 3.8 mmol/L (ref 3.5–5.1)
Sodium: 137 mmol/L (ref 135–145)
Total Bilirubin: 0.6 mg/dL (ref 0.0–1.2)
Total Protein: 6.5 g/dL (ref 6.5–8.1)

## 2024-08-22 LAB — CBC
HCT: 39.3 % (ref 39.0–52.0)
Hemoglobin: 12.9 g/dL — ABNORMAL LOW (ref 13.0–17.0)
MCH: 30.6 pg (ref 26.0–34.0)
MCHC: 32.8 g/dL (ref 30.0–36.0)
MCV: 93.3 fL (ref 80.0–100.0)
Platelets: 257 K/uL (ref 150–400)
RBC: 4.21 MIL/uL — ABNORMAL LOW (ref 4.22–5.81)
RDW: 13.4 % (ref 11.5–15.5)
WBC: 9.2 K/uL (ref 4.0–10.5)
nRBC: 0 % (ref 0.0–0.2)

## 2024-08-22 NOTE — Progress Notes (Signed)
   08/22/24 1931  BiPAP/CPAP/SIPAP  BiPAP/CPAP/SIPAP Pt Type Adult  Reason BIPAP/CPAP not in use Non-compliant

## 2024-08-22 NOTE — Plan of Care (Signed)
°  Problem: Education: Goal: Knowledge of General Education information will improve Description: Including pain rating scale, medication(s)/side effects and non-pharmacologic comfort measures Outcome: Progressing   Problem: Health Behavior/Discharge Planning: Goal: Ability to manage health-related needs will improve Outcome: Progressing   Problem: Clinical Measurements: Goal: Ability to maintain clinical measurements within normal limits will improve Outcome: Progressing   Problem: Activity: Goal: Risk for activity intolerance will decrease Outcome: Progressing   Problem: Nutrition: Goal: Adequate nutrition will be maintained Outcome: Progressing   Problem: Coping: Goal: Level of anxiety will decrease Outcome: Progressing   Problem: Elimination: Goal: Will not experience complications related to urinary retention Outcome: Progressing

## 2024-08-22 NOTE — Progress Notes (Signed)
 PROGRESS NOTE    [name]  FMW:[id number] DOB: [birth year] DOA: 08/11/2024 PCP: [name], MD   Brief Narrative:  Patient is a 68 year old male, obese, with medical history significant of anxiety, depression, schizoaffective disorder, bowel obstruction, chronic anemia, colon polyps, constipation, GERD, type 2 diabetes, hyperlipidemia, hypertension, memory loss, MRSA carrier, history of pneumonia, seizure disorder and skin cancer.  Patient was brought to the emergency department after he had a fall.  Per patient, he stood up too fast, felt dizzy and then fell down.  He hit his head, but stated not very hard.  He did not lose consciousness.  Imaging studies revealed right ankle fracture.    On 08/13/2024, patient underwent: Open treatment right trimalleolar ankle fracture without posterior fixation Open treatment syndesmosis Ankle stress view fluoroscopy  08/22/2024: Patient seen.  Patient is awaiting disposition.  No new complaints today.  Assessment & Plan:   Principal Problem:   Closed right ankle fracture Active Problems:   Hyperlipidemia   Seizure disorder (HCC)   Constipation, chronic   Essential hypertension   Mild sleep apnea   DM (diabetes mellitus), type 2 (HCC)   Class 1 obesity   Grade I diastolic dysfunction   Hyponatremia   Closed fracture of shaft of right fibula   Displaced fracture of medial malleolus of right tibia, initial encounter for closed fracture   Acute respiratory failure with hypoxia/aspiration pneumonia Postoperatively the patient could not be weaned off of oxygen.  Chest x-ray suggested left lung infiltrate.  It is quite possible patient may have aspirated.  Recently done echocardiogram showed normal LVEF with grade 1 diastolic dysfunction. Patient was started on antibiotics.  Was also given a dose of furosemide . Oxygenation has improved though he remains on 1 to 2 L of oxygen by nasal cannula.  Probably due to his sleep apnea. He was  changed over to oral antibiotics.  Will give him a total of 7-day course.  Try to wean off oxygen to maintain saturations greater than 90%. 08/22/2024: Stable.   Right ankle fracture Patient seen by orthopedics and underwent surgery on 8/28.  Pain seems to be reasonably well-controlled.  PT OT eval. SNF is recommended for short-term rehab. 08/22/2024: Pursue disposition.   Acute urinary retention-likely due to constipation and immobilization.  Continue Flomax .  A Foley catheter was placed after multiple In-N-Out catheterization.  UA showed no evidence of UTI he was already on antibiotics. Will try DC Foley 08/21/2024 and encouraged him to urinate in the urinal.  He had multiple bowel movements.  Need to make sure he is on bowel regime.     Constipation Has not had a bowel movement in several days.  This is despite laxatives.  Suppository was ordered yesterday but looks like patient refused.  Will order Fleet enema today.  He was told about the importance of having a BM.  Check TSH.   Hyponatremia Likely due to mild hypovolemia.   08/22/2024: Resolved.  Sodium of 137 today.     Grade 1 diastolic dysfunction/history of mitral regurgitation Stable from a cardiac standpoint.  Echocardiogram shows LVEF of 50 to 55%.  Grade 1 diastolic dysfunction noted.  No significant valvular abnormalities. See above regarding respiratory failure.   Volume status is stable. 08/22/2024: Compensated.   Essential hypertension Metoprolol  was held due to soft blood pressures.  Blood pressures appear to have rebounded.  Metoprolol  resumed at a lower dose.  Blood pressure is stable. 08/22/2024: Controlled.   Normocytic anemia Mild drop in hemoglobin  is most likely dilutional.  Subsequently stable.  Checking labs periodically.   Hyperlipidemia Continue statin.   Seizure disorder Continue lacosamide .   Chronic constipation Continue bowel regimen.   Diabetes mellitus type 2 Noted to be on metformin  which is being  continued.  SSI.  Monitor CBGs. HbA1c is 6.9.   Sleep apnea CPAP.  Requiring oxygen at low-dose.   Class I obesity Estimated body mass index is 32.82 kg/m as calculated from the following:   Height as of this encounter: 5' 9 (1.753 m).   Weight as of this encounter: 100.8 kg.    Estimated body mass index is 32.82 kg/m as calculated from the following:   Height as of this encounter: 5' 9 (1.753 m).   Weight as of this encounter: 100.8 kg.  DVT prophylaxis: Lovenox   code Status: Full full code  family Communication: None  Disposition Plan:  Status is: Await safe disposition Consultants: Ortho Procedures: ORIF right ankle Antimicrobials: None  Subjective: No new complaints.  Objective: Vitals:   08/21/24 1318 08/21/24 2248 08/22/24 0646 08/22/24 1414  BP: 132/79 131/77 122/67 (!) 143/130  Pulse: 79 80 (!) 47 73  Resp: 16 16 16 16   Temp: 99.3 F (37.4 C) 98.6 F (37 C) 98.3 F (36.8 C) 98.2 F (36.8 C)  TempSrc:  Oral Oral   SpO2: 94% 93% (!) 85% 93%  Weight:      Height:        Intake/Output Summary (Last 24 hours) at 08/22/2024 1619 Last data filed at 08/22/2024 1100 Gross per 24 hour  Intake 600 ml  Output 2125 ml  Net -1525 ml   Filed Weights   08/11/24 0857 08/11/24 1259 08/13/24 1843  Weight: 99.8 kg 99.8 kg 100.8 kg    Examination:  General exam: Appears in no acute distress.  Patient is morbidly obese. Respiratory system: Clear to auscultation. Respiratory effort normal. Cardiovascular system: S1-S2.  gastrointestinal system: Abdomen is morbidly obese, soft and nontender.  Central nervous system: Alert and oriented. No focal neurological deficits. Extremities: right ankle Covered with dressing    Data Reviewed: I have personally reviewed following labs and imaging studies  CBC: Recent Labs  Lab 08/17/24 0345 08/22/24 0319  WBC 8.0 9.2  HGB 12.1* 12.9*  HCT 35.8* 39.3  MCV 95.0 93.3  PLT 197 257   Basic Metabolic Panel: Recent Labs   Lab 08/17/24 0345 08/19/24 0348 08/20/24 0349 08/22/24 0319  NA 138 138  --  137  K 4.1 4.0  --  3.8  CL 102 102  --  102  CO2 24 24  --  21*  GLUCOSE 125* 133*  --  122*  BUN 17 13  --  19  CREATININE 0.84 0.87 0.94 0.97  CALCIUM  9.1 9.3  --  9.4  MG  --  2.4  --   --    GFR: Estimated Creatinine Clearance: 82.9 mL/min (by C-G formula based on SCr of 0.97 mg/dL). Liver Function Tests: Recent Labs  Lab 08/22/24 0319  AST 37  ALT 40  ALKPHOS 105  BILITOT 0.6  PROT 6.5  ALBUMIN  3.9    No results for input(s): LIPASE, AMYLASE in the last 168 hours. No results for input(s): AMMONIA in the last 168 hours. Coagulation Profile: No results for input(s): INR, PROTIME in the last 168 hours. Cardiac Enzymes: No results for input(s): CKTOTAL, CKMB, CKMBINDEX, TROPONINI in the last 168 hours. BNP (last 3 results) No results for input(s): PROBNP in the last 8760  hours. HbA1C: No results for input(s): HGBA1C in the last 72 hours. CBG: Recent Labs  Lab 08/21/24 1120 08/21/24 1637 08/21/24 2253 08/22/24 0727 08/22/24 1205  GLUCAP 136* 164* 142* 141* 178*   Lipid Profile: No results for input(s): CHOL, HDL, LDLCALC, TRIG, CHOLHDL, LDLDIRECT in the last 72 hours. Thyroid Function Tests: No results for input(s): TSH, T4TOTAL, FREET4, T3FREE, THYROIDAB in the last 72 hours.  Anemia Panel: No results for input(s): VITAMINB12, FOLATE, FERRITIN, TIBC, IRON, RETICCTPCT in the last 72 hours. Sepsis Labs: No results for input(s): PROCALCITON, LATICACIDVEN in the last 168 hours.   Recent Results (from the past 240 hours)  Surgical PCR screen     Status: None   Collection Time: 08/12/24 10:36 PM   Specimen: Nasal Mucosa; Nasal Swab  Result Value Ref Range Status   MRSA, PCR NEGATIVE NEGATIVE Final   Staphylococcus aureus NEGATIVE NEGATIVE Final    Comment: (NOTE) The Xpert SA Assay (FDA approved for NASAL specimens  in patients 81 years of age and older), is one component of a comprehensive surveillance program. It is not intended to diagnose infection nor to guide or monitor treatment. Performed at [facility], [address], [place], [place] [address]      Radiology Studies: No results found.   Scheduled Meds:  amitriptyline   200 mg Oral QHS   atorvastatin   40 mg Oral QHS   bisacodyl   10 mg Oral Daily   Chlorhexidine  Gluconate Cloth  6 each Topical Daily   enoxaparin  (LOVENOX ) injection  50 mg Subcutaneous Q24H   insulin  aspart  0-15 Units Subcutaneous TID WC   lacosamide   150 mg Oral BID   LORazepam   0.5 mg Intravenous Once   melatonin  3 mg Oral QHS   metFORMIN   1,000 mg Oral Q breakfast   metoprolol  succinate  50 mg Oral Daily   nystatin   5 mL Oral QID   polyethylene glycol  17 g Oral BID   saccharomyces boulardii  250 mg Oral BID   senna-docusate  2 tablet Oral BID   tamsulosin   0.4 mg Oral Daily   Continuous Infusions:   LOS: 11 days    Leatrice LILLETTE Chapel, MD  08/22/2024, 4:19 PM

## 2024-08-22 NOTE — Plan of Care (Signed)
  Problem: Education: Goal: Knowledge of General Education information will improve Description: Including pain rating scale, medication(s)/side effects and non-pharmacologic comfort measures Outcome: Progressing   Problem: Health Behavior/Discharge Planning: Goal: Ability to manage health-related needs will improve Outcome: Progressing   Problem: Clinical Measurements: Goal: Ability to maintain clinical measurements within normal limits will improve Outcome: Progressing   Problem: Activity: Goal: Risk for activity intolerance will decrease Outcome: Progressing   Problem: Nutrition: Goal: Adequate nutrition will be maintained Outcome: Progressing   Problem: Pain Managment: Goal: General experience of comfort will improve and/or be controlled Outcome: Progressing   Problem: Safety: Goal: Ability to remain free from injury will improve Outcome: Progressing   Problem: Skin Integrity: Goal: Risk for impaired skin integrity will decrease Outcome: Progressing

## 2024-08-23 DIAGNOSIS — S82891P Other fracture of right lower leg, subsequent encounter for closed fracture with malunion: Secondary | ICD-10-CM

## 2024-08-23 LAB — GLUCOSE, CAPILLARY
Glucose-Capillary: 118 mg/dL — ABNORMAL HIGH (ref 70–99)
Glucose-Capillary: 171 mg/dL — ABNORMAL HIGH (ref 70–99)
Glucose-Capillary: 103 mg/dL — ABNORMAL HIGH (ref 70–99)
Glucose-Capillary: 111 mg/dL — ABNORMAL HIGH (ref 70–99)

## 2024-08-23 NOTE — Plan of Care (Signed)

## 2024-08-23 NOTE — Progress Notes (Signed)
 PROGRESS NOTE    [name]  FMW:[id number] DOB: June 09, 1956 DOA: 08/11/2024 PCP: [name], MD   Brief Narrative:  Patient is a 68 year old male, obese, with medical history significant of anxiety, depression, schizoaffective disorder, bowel obstruction, chronic anemia, colon polyps, constipation, GERD, type 2 diabetes, hyperlipidemia, hypertension, memory loss, MRSA carrier, history of pneumonia, seizure disorder and skin cancer.  Patient was brought to the emergency department after he had a fall.  Per patient, he stood up too fast, felt dizzy and then fell down.  He hit his head, but stated not very hard.  He did not lose consciousness.  Imaging studies revealed right ankle fracture.    On 08/13/2024, patient underwent: Open treatment right trimalleolar ankle fracture without posterior fixation Open treatment syndesmosis Ankle stress view fluoroscopy  08/23/2024: Patient seen.  Patient is awaiting disposition.  No new complaints today.  Assessment & Plan:   Principal Problem:   Closed right ankle fracture Active Problems:   Hyperlipidemia   Seizure disorder (HCC)   Constipation, chronic   Essential hypertension   Mild sleep apnea   DM (diabetes mellitus), type 2 (HCC)   Class 1 obesity   Grade I diastolic dysfunction   Hyponatremia   Closed fracture of shaft of right fibula   Displaced fracture of medial malleolus of right tibia, initial encounter for closed fracture   Acute respiratory failure with hypoxia/aspiration pneumonia Postoperatively the patient could not be weaned off of oxygen.  Chest x-ray suggested left lung infiltrate.  It is quite possible patient may have aspirated.  Recently done echocardiogram showed normal LVEF with grade 1 diastolic dysfunction. Patient was started on antibiotics.  Was also given a dose of furosemide . Oxygenation has improved though he remains on 1 to 2 L of oxygen by nasal cannula.  Probably due to his sleep apnea. He was  changed over to oral antibiotics.  Will give him a total of 7-day course.  Try to wean off oxygen to maintain saturations greater than 90%. 08/22/2024: Stable.   Right ankle fracture Patient seen by orthopedics and underwent surgery on 8/28.  Pain seems to be reasonably well-controlled.  PT OT eval. SNF is recommended for short-term rehab. 08/22/2024: Pursue disposition.   Acute urinary retention-likely due to constipation and immobilization.  Continue Flomax .  A Foley catheter was placed after multiple In-N-Out catheterization.  UA showed no evidence of UTI he was already on antibiotics. Will try DC Foley 08/21/2024 and encouraged him to urinate in the urinal.  He had multiple bowel movements.  Need to make sure he is on bowel regime.     Constipation Has not had a bowel movement in several days.  This is despite laxatives.  Suppository was ordered yesterday but looks like patient refused.  Will order Fleet enema today.  He was told about the importance of having a BM.  Check TSH.   Hyponatremia Likely due to mild hypovolemia.   08/22/2024: Resolved.  Sodium of 137 today.     Grade 1 diastolic dysfunction/history of mitral regurgitation Stable from a cardiac standpoint.  Echocardiogram shows LVEF of 50 to 55%.  Grade 1 diastolic dysfunction noted.  No significant valvular abnormalities. See above regarding respiratory failure.   Volume status is stable. 08/22/2024: Compensated.   Essential hypertension Metoprolol  was held due to soft blood pressures.  Blood pressures appear to have rebounded.  Metoprolol  resumed at a lower dose.  Blood pressure is stable. 08/22/2024: Controlled.   Normocytic anemia Mild drop in hemoglobin  is most likely dilutional.  Subsequently stable.  Checking labs periodically.   Hyperlipidemia Continue statin.   Seizure disorder Continue lacosamide .   Chronic constipation Continue bowel regimen.   Diabetes mellitus type 2 Noted to be on metformin  which is being  continued.  SSI.  Monitor CBGs. HbA1c is 6.9.   Sleep apnea CPAP.  Requiring oxygen at low-dose.   Class I obesity Estimated body mass index is 32.82 kg/m as calculated from the following:   Height as of this encounter: 5' 9 (1.753 m).   Weight as of this encounter: 100.8 kg.    Estimated body mass index is 32.82 kg/m as calculated from the following:   Height as of this encounter: 5' 9 (1.753 m).   Weight as of this encounter: 100.8 kg.  DVT prophylaxis: Lovenox   code Status: Full full code  family Communication: None  Disposition Plan:  Status is: Await safe disposition Consultants: Ortho Procedures: ORIF right ankle Antimicrobials: None  Subjective: No new complaints.  Objective: Vitals:   08/22/24 1652 08/22/24 2220 08/23/24 0608 08/23/24 1406  BP: (!) 165/96 136/85 137/61 121/79  Pulse: 80 74 70 66  Resp: 16 20 19 17   Temp:  99 F (37.2 C) 99.5 F (37.5 C) 97.8 F (36.6 C)  TempSrc:  Oral    SpO2: 96% 92% 91% 95%  Weight:      Height:        Intake/Output Summary (Last 24 hours) at 08/23/2024 1830 Last data filed at 08/23/2024 1508 Gross per 24 hour  Intake 620 ml  Output 1675 ml  Net -1055 ml   Filed Weights   08/11/24 0857 08/11/24 1259 08/13/24 1843  Weight: 99.8 kg 99.8 kg 100.8 kg    Examination:  General exam: Appears in no acute distress.  Patient is morbidly obese. Respiratory system: Clear to auscultation. Respiratory effort normal. Cardiovascular system: S1-S2.  gastrointestinal system: Abdomen is morbidly obese, soft and nontender.  Central nervous system: Alert and oriented. No focal neurological deficits. Extremities: right ankle Covered with dressing    Data Reviewed: I have personally reviewed following labs and imaging studies  CBC: Recent Labs  Lab 08/17/24 0345 08/22/24 0319  WBC 8.0 9.2  HGB 12.1* 12.9*  HCT 35.8* 39.3  MCV 95.0 93.3  PLT 197 257   Basic Metabolic Panel: Recent Labs  Lab 08/17/24 0345  08/19/24 0348 08/20/24 0349 08/22/24 0319  NA 138 138  --  137  K 4.1 4.0  --  3.8  CL 102 102  --  102  CO2 24 24  --  21*  GLUCOSE 125* 133*  --  122*  BUN 17 13  --  19  CREATININE 0.84 0.87 0.94 0.97  CALCIUM  9.1 9.3  --  9.4  MG  --  2.4  --   --    GFR: Estimated Creatinine Clearance: 82.9 mL/min (by C-G formula based on SCr of 0.97 mg/dL). Liver Function Tests: Recent Labs  Lab 08/22/24 0319  AST 37  ALT 40  ALKPHOS 105  BILITOT 0.6  PROT 6.5  ALBUMIN  3.9    No results for input(s): LIPASE, AMYLASE in the last 168 hours. No results for input(s): AMMONIA in the last 168 hours. Coagulation Profile: No results for input(s): INR, PROTIME in the last 168 hours. Cardiac Enzymes: No results for input(s): CKTOTAL, CKMB, CKMBINDEX, TROPONINI in the last 168 hours. BNP (last 3 results) No results for input(s): PROBNP in the last 8760 hours. HbA1C: No results for  input(s): HGBA1C in the last 72 hours. CBG: Recent Labs  Lab 08/22/24 1815 08/22/24 2223 08/23/24 0751 08/23/24 1152 08/23/24 1703  GLUCAP 135* 163* 118* 171* 103*   Lipid Profile: No results for input(s): CHOL, HDL, LDLCALC, TRIG, CHOLHDL, LDLDIRECT in the last 72 hours. Thyroid Function Tests: No results for input(s): TSH, T4TOTAL, FREET4, T3FREE, THYROIDAB in the last 72 hours.  Anemia Panel: No results for input(s): VITAMINB12, FOLATE, FERRITIN, TIBC, IRON, RETICCTPCT in the last 72 hours. Sepsis Labs: No results for input(s): PROCALCITON, LATICACIDVEN in the last 168 hours.   No results found for this or any previous visit (from the past 240 hours).    Radiology Studies: No results found.   Scheduled Meds:  amitriptyline   200 mg Oral QHS   atorvastatin   40 mg Oral QHS   bisacodyl   10 mg Oral Daily   enoxaparin  (LOVENOX ) injection  50 mg Subcutaneous Q24H   insulin  aspart  0-15 Units Subcutaneous TID WC   lacosamide   150 mg  Oral BID   LORazepam   0.5 mg Intravenous Once   melatonin  3 mg Oral QHS   metFORMIN   1,000 mg Oral Q breakfast   metoprolol  succinate  50 mg Oral Daily   nystatin   5 mL Oral QID   polyethylene glycol  17 g Oral BID   saccharomyces boulardii  250 mg Oral BID   senna-docusate  2 tablet Oral BID   tamsulosin   0.4 mg Oral Daily   Continuous Infusions:   LOS: 12 days    Leatrice LILLETTE Chapel, MD  08/23/2024, 6:30 PM

## 2024-08-23 NOTE — Plan of Care (Signed)

## 2024-08-24 LAB — GLUCOSE, CAPILLARY
Glucose-Capillary: 122 mg/dL — ABNORMAL HIGH (ref 70–99)
Glucose-Capillary: 174 mg/dL — ABNORMAL HIGH (ref 70–99)
Glucose-Capillary: 125 mg/dL — ABNORMAL HIGH (ref 70–99)
Glucose-Capillary: 162 mg/dL — ABNORMAL HIGH (ref 70–99)

## 2024-08-24 NOTE — Plan of Care (Signed)

## 2024-08-24 NOTE — Plan of Care (Signed)
   Problem: Coping: Goal: Level of anxiety will decrease Outcome: Progressing   Problem: Pain Managment: Goal: General experience of comfort will improve and/or be controlled Outcome: Progressing   Problem: Safety: Goal: Ability to remain free from injury will improve Outcome: Progressing

## 2024-08-24 NOTE — Progress Notes (Signed)
 PROGRESS NOTE    [name]  FMW:[id number] DOB: 02/24/1956 DOA: 08/11/2024 PCP: [name], MD   Brief Narrative:  Patient is a 68 year old male, obese, with medical history significant of anxiety, depression, schizoaffective disorder, bowel obstruction, chronic anemia, colon polyps, constipation, GERD, type 2 diabetes, hyperlipidemia, hypertension, memory loss, MRSA carrier, history of pneumonia, seizure disorder and skin cancer.  Patient was brought to the emergency department after he had a fall.  Per patient, he stood up too fast, felt dizzy and then fell down.  He hit his head, but stated not very hard.  He did not lose consciousness.  Imaging studies revealed right ankle fracture.    On 08/13/2024, patient underwent: Open treatment right trimalleolar ankle fracture without posterior fixation Open treatment syndesmosis Ankle stress view fluoroscopy  08/24/2024: Patient seen.  Patient is awaiting disposition.  Patient reports being significantly constipated.  Patient is asking for an enema.  For trial of soapsuds enema.  Patient is awaiting disposition  Assessment & Plan:   Principal Problem:   Closed right ankle fracture Active Problems:   Hyperlipidemia   Seizure disorder (HCC)   Constipation, chronic   Essential hypertension   Mild sleep apnea   DM (diabetes mellitus), type 2 (HCC)   Class 1 obesity   Grade I diastolic dysfunction   Hyponatremia   Closed fracture of shaft of right fibula   Displaced fracture of medial malleolus of right tibia, initial encounter for closed fracture   Acute respiratory failure with hypoxia/aspiration pneumonia Postoperatively the patient could not be weaned off of oxygen.  Chest x-ray suggested left lung infiltrate.  It is quite possible patient may have aspirated.  Recently done echocardiogram showed normal LVEF with grade 1 diastolic dysfunction. Patient was started on antibiotics.  Was also given a dose of  furosemide . Oxygenation has improved though he remains on 1 to 2 L of oxygen by nasal cannula.  Probably due to his sleep apnea. He was changed over to oral antibiotics.  Will give him a total of 7-day course.  Try to wean off oxygen to maintain saturations greater than 90%. 08/24/2024: Stable.   Right ankle fracture Patient seen by orthopedics and underwent surgery on 8/28.  Pain seems to be reasonably well-controlled.  PT OT eval. SNF is recommended for short-term rehab. 08/24/2024: Pursue disposition.   Acute urinary retention-likely due to constipation and immobilization.  Continue Flomax .  A Foley catheter was placed after multiple In-N-Out catheterization.  UA showed no evidence of UTI he was already on antibiotics. Will try DC Foley 08/21/2024 and encouraged him to urinate in the urinal.  He had multiple bowel movements.  Need to make sure he is on bowel regime.     Constipation Has not had a bowel movement in several days.  This is despite laxatives.  Suppository was ordered yesterday but looks like patient refused.  Will order Fleet enema today.  He was told about the importance of having a BM.  Check TSH. 08/24/2024: Trial of soapsuds enema.   Hyponatremia Likely due to mild hypovolemia.   08/22/2024: Resolved.  Sodium of 137 today.     Grade 1 diastolic dysfunction/history of mitral regurgitation Stable from a cardiac standpoint.  Echocardiogram shows LVEF of 50 to 55%.  Grade 1 diastolic dysfunction noted.  No significant valvular abnormalities. See above regarding respiratory failure.   Volume status is stable. 08/24/2024: Compensated.   Essential hypertension Metoprolol  was held due to soft blood pressures.  Blood pressures appear to  have rebounded.  Metoprolol  resumed at a lower dose.  Blood pressure is stable. 08/24/2024: Controlled.   Normocytic anemia Mild drop in hemoglobin is most likely dilutional.  Subsequently stable.  Checking labs periodically.   Hyperlipidemia Continue  statin.   Seizure disorder Continue lacosamide .   Chronic constipation Continue bowel regimen.   Diabetes mellitus type 2 Noted to be on metformin  which is being continued.  SSI.  Monitor CBGs. HbA1c is 6.9.   Sleep apnea CPAP.  Requiring oxygen at low-dose.   Class I obesity Estimated body mass index is 32.82 kg/m as calculated from the following:   Height as of this encounter: 5' 9 (1.753 m).   Weight as of this encounter: 100.8 kg.    Estimated body mass index is 32.82 kg/m as calculated from the following:   Height as of this encounter: 5' 9 (1.753 m).   Weight as of this encounter: 100.8 kg.  DVT prophylaxis: Lovenox   code Status: Full full code  family Communication: None  Disposition Plan:  Status is: Await safe disposition Consultants: Ortho Procedures: ORIF right ankle Antimicrobials: None  Subjective: No new complaints.  Objective: Vitals:   08/23/24 1406 08/23/24 2147 08/24/24 0611 08/24/24 1313  BP: 121/79 126/74 119/62 139/79  Pulse: 66 73 70 76  Resp: 17 18 18 16   Temp: 97.8 F (36.6 C) 98.1 F (36.7 C) 97.9 F (36.6 C) 98.2 F (36.8 C)  TempSrc:  Oral Oral   SpO2: 95% 91% 93% 96%  Weight:      Height:        Intake/Output Summary (Last 24 hours) at 08/24/2024 1536 Last data filed at 08/24/2024 1342 Gross per 24 hour  Intake 1060 ml  Output 1800 ml  Net -740 ml   Filed Weights   08/11/24 0857 08/11/24 1259 08/13/24 1843  Weight: 99.8 kg 99.8 kg 100.8 kg    Examination:  General exam: Appears in no acute distress.  Patient is morbidly obese. Respiratory system: Clear to auscultation. Respiratory effort normal. Cardiovascular system: S1-S2.  gastrointestinal system: Abdomen is morbidly obese, soft and nontender.  Central nervous system: Alert and oriented. No focal neurological deficits. Extremities: right ankle Covered with dressing    Data Reviewed: I have personally reviewed following labs and imaging studies  CBC: Recent  Labs  Lab 08/22/24 0319  WBC 9.2  HGB 12.9*  HCT 39.3  MCV 93.3  PLT 257   Basic Metabolic Panel: Recent Labs  Lab 08/19/24 0348 08/20/24 0349 08/22/24 0319  NA 138  --  137  K 4.0  --  3.8  CL 102  --  102  CO2 24  --  21*  GLUCOSE 133*  --  122*  BUN 13  --  19  CREATININE 0.87 0.94 0.97  CALCIUM  9.3  --  9.4  MG 2.4  --   --    GFR: Estimated Creatinine Clearance: 82.9 mL/min (by C-G formula based on SCr of 0.97 mg/dL). Liver Function Tests: Recent Labs  Lab 08/22/24 0319  AST 37  ALT 40  ALKPHOS 105  BILITOT 0.6  PROT 6.5  ALBUMIN  3.9    No results for input(s): LIPASE, AMYLASE in the last 168 hours. No results for input(s): AMMONIA in the last 168 hours. Coagulation Profile: No results for input(s): INR, PROTIME in the last 168 hours. Cardiac Enzymes: No results for input(s): CKTOTAL, CKMB, CKMBINDEX, TROPONINI in the last 168 hours. BNP (last 3 results) No results for input(s): PROBNP in the last  8760 hours. HbA1C: No results for input(s): HGBA1C in the last 72 hours. CBG: Recent Labs  Lab 08/23/24 1152 08/23/24 1703 08/23/24 2150 08/24/24 0717 08/24/24 1125  GLUCAP 171* 103* 111* 122* 174*   Lipid Profile: No results for input(s): CHOL, HDL, LDLCALC, TRIG, CHOLHDL, LDLDIRECT in the last 72 hours. Thyroid Function Tests: No results for input(s): TSH, T4TOTAL, FREET4, T3FREE, THYROIDAB in the last 72 hours.  Anemia Panel: No results for input(s): VITAMINB12, FOLATE, FERRITIN, TIBC, IRON, RETICCTPCT in the last 72 hours. Sepsis Labs: No results for input(s): PROCALCITON, LATICACIDVEN in the last 168 hours.   No results found for this or any previous visit (from the past 240 hours).    Radiology Studies: No results found.   Scheduled Meds:  amitriptyline   200 mg Oral QHS   atorvastatin   40 mg Oral QHS   bisacodyl   10 mg Oral Daily   enoxaparin  (LOVENOX ) injection  50 mg  Subcutaneous Q24H   insulin  aspart  0-15 Units Subcutaneous TID WC   lacosamide   150 mg Oral BID   LORazepam   0.5 mg Intravenous Once   melatonin  3 mg Oral QHS   metFORMIN   1,000 mg Oral Q breakfast   metoprolol  succinate  50 mg Oral Daily   nystatin   5 mL Oral QID   polyethylene glycol  17 g Oral BID   saccharomyces boulardii  250 mg Oral BID   senna-docusate  2 tablet Oral BID   tamsulosin   0.4 mg Oral Daily   Continuous Infusions:   LOS: 13 days    Leatrice LILLETTE Chapel, MD  08/24/2024, 3:36 PM

## 2024-08-24 NOTE — Progress Notes (Signed)
   08/24/24 2037  BiPAP/CPAP/SIPAP  BiPAP/CPAP/SIPAP Pt Type Adult  Reason BIPAP/CPAP not in use Non-compliant (Patient refuses CPAP.)

## 2024-08-24 NOTE — Progress Notes (Signed)
 Physical Therapy Treatment Patient Details Name: [name] MRN: [id number] DOB: Mar 29, 1956 Today's Date: 08/24/2024   History of Present Illness [name] is a 68 y.o. male admitted 08/11/24 with dx R ankle fx with medical history significant of anxiety, depression, schizoaffective disorder, bowel obstruction, chronic anemia, colon polyps, constipation, GERD, type 2 diabetes, hyperlipidemia, hypertension, memory loss, MRSA carrier, history of pneumonia, seizure disorder, skin cancer who was brought to the emergency department after he had a fall.    PT Comments   Pt admitted with above diagnosis.  Pt currently with functional limitations due to the deficits listed below (see PT Problem List). Pt in bed when PT arrived. Pt agreeable to therapy intervention. Pt reports no R LE pain. Pt performed supine to sit mod I. Pt required set up cues and CGA for safety with sit to stand  from EOB to RW with knee support and performed SPT bed to wc with a few hop steps with use of RW with R knee support, pt exhibited poor eccentric control with L LE decent to floor with gait attempts and discontinued due to possible risk of fall, L LE injury and pt reported fatigue. PT communicated concern per home setting with step to enter apartment and potential for wc not to fit through bathroom doorway. Pt demonstrates decreased awareness of functional deficits and difficulty with problem solving for safety in home setting as pt continues to decline SNF. Pt left seated in wc and all needs in place pt encouraged to self propel wc on unit later today. Pt will benefit from acute skilled PT to increase their independence and safety with mobility to allow discharge.      If plan is discharge home, recommend the following: Two people to help with walking and/or transfers;A lot of help with bathing/dressing/bathroom;Assistance with cooking/housework;Assist for transportation;Help with stairs or ramp for entrance   Can travel by  private vehicle     No  Equipment Recommendations  Other (comment) (TBD at next venue of care)    Recommendations for Other Services       Precautions / Restrictions Precautions Precautions: Fall Recall of Precautions/Restrictions: Impaired Precaution/Restrictions Comments: pt continues to require cues for R LE NWB Restrictions Weight Bearing Restrictions Per Provider Order: Yes RLE Weight Bearing Per Provider Order: Non weight bearing     Mobility  Bed Mobility Overal bed mobility: Needs Assistance, Modified Independent Bed Mobility: Sit to Supine     Supine to sit: HOB elevated, Used rails, Modified independent (Device/Increase time)     General bed mobility comments: no cues    Transfers Overall transfer level: Needs assistance Equipment used: Rolling walker (2 wheels) (R knee support attachment) Transfers: Sit to/from Stand, Bed to chair/wheelchair/BSC Sit to Stand: Contact guard assist, From elevated surface Stand pivot transfers: Contact guard assist         General transfer comment: pt required cues pt exhibited abiltiy to power up, push with B UE from EOB and obtain extension posture with B UE support at RW and R knee walker support, pt instructed to stand with R LE NWB pt able to place  minimal verbal cues, pt demonstrated increased stability, pt exhibited improved abiltiy to recall and observe R LE NWB status, pt progressing with IND and safety with R knee support on RW. pt able to complete SPT bed to wc with mild instability noted cues for transitioning B UE from RW to wc for improved eccentric control pt able to take a few hop  steps with RW and knee support however not controlled with L LE to floor and gait limited due to potential for L LE injury and fatige    Ambulation/Gait                   Psychologist, counselling mobility: Yes Wheelchair propulsion: Both upper extremities, Left lower  extremity Wheelchair parts: Needs assistance Distance: 150 Wheelchair Assistance Details (indicate cue type and reason): pt required decreased cues and no physical assist for wc mobiltiy today with a focus on turns, retrograde propelling and obstacle navigation in addition to management of wc brakes, and R leg rest pt will require reinforcement and ongoing ed for wc management and mobility   Tilt Bed    Modified Rankin (Stroke Patients Only)       Balance Overall balance assessment: History of Falls, Needs assistance Sitting-balance support: Feet supported, Single extremity supported Sitting balance-Leahy Scale: Good     Standing balance support: Bilateral upper extremity supported, During functional activity, Reliant on assistive device for balance Standing balance-Leahy Scale: Poor Standing balance comment: pt requires B UE support to maintain static standing                            Communication Communication Communication: No apparent difficulties  Cognition Arousal: Alert Behavior During Therapy: Anxious, Impulsive   PT - Cognitive impairments: No apparent impairments                         Following commands: Intact      Cueing Cueing Techniques: Verbal cues, Visual cues, Tactile cues, Gestural cues  Exercises      General Comments        Pertinent Vitals/Pain Pain Assessment Pain Assessment: No/denies pain    Home Living                          Prior Function            PT Goals (current goals can now be found in the care plan section) Acute Rehab PT Goals Patient Stated Goal: to be able to go home PT Goal Formulation: With patient Time For Goal Achievement: 08/28/24 Potential to Achieve Goals: Fair Additional Goals Additional Goal #1: pt will demonstrate abiltiy to perform wc mobiltiy tasks with B UE and L LE 200 feet, navigate in personal room mod I and manage wc components including leg rest and breaks mod I in  order to maximize safety and IND with wc mobility as required in home setting. Progress towards PT goals: Progressing toward goals    Frequency    Min 3X/week      PT Plan      Co-evaluation              AM-PAC PT 6 Clicks Mobility   Outcome Measure  Help needed turning from your back to your side while in a flat bed without using bedrails?: A Little Help needed moving from lying on your back to sitting on the side of a flat bed without using bedrails?: A Little Help needed moving to and from a bed to a chair (including a wheelchair)?: A Lot Help needed standing up from a chair using your arms (e.g., wheelchair or bedside chair)?: A Lot Help needed to walk in hospital room?:  A Lot Help needed climbing 3-5 steps with a railing? : Total 6 Click Score: 13    End of Session Equipment Utilized During Treatment: Gait belt Activity Tolerance: No increased pain;Patient limited by fatigue Patient left: with call bell/phone within reach;Other (comment) (wc) Nurse Communication: Mobility status PT Visit Diagnosis: Unsteadiness on feet (R26.81);Other abnormalities of gait and mobility (R26.89);Muscle weakness (generalized) (M62.81);History of falling (Z91.81);Difficulty in walking, not elsewhere classified (R26.2);Pain Pain - Right/Left: Right Pain - part of body: Leg;Ankle and joints of foot     Time: 1035-1105 PT Time Calculation (min) (ACUTE ONLY): 30 min  Charges:    $Therapeutic Activity: 8-22 mins $Wheel Chair Management: 8-22 mins PT General Charges $$ ACUTE PT VISIT: 1 Visit                     [place], PT Acute Rehab    [place] VEAR Drone 08/24/2024, 11:28 AM

## 2024-08-24 NOTE — Progress Notes (Signed)
   08/23/24 2147  BiPAP/CPAP/SIPAP  BiPAP/CPAP/SIPAP Pt Type Adult  Reason BIPAP/CPAP not in use Non-compliant  BiPAP/CPAP /SiPAP Vitals  Temp 98.1 F (36.7 C)  Pulse Rate 73  Resp 18  BP 126/74  SpO2 91 %  MEWS Score/Color  MEWS Score 0  MEWS Score Color Landy

## 2024-08-25 LAB — URINALYSIS, ROUTINE W REFLEX MICROSCOPIC
Bacteria, UA: NONE SEEN
Bilirubin Urine: NEGATIVE
Glucose, UA: NEGATIVE mg/dL
Ketones, ur: NEGATIVE mg/dL
Leukocytes,Ua: NEGATIVE
Nitrite: NEGATIVE
Protein, ur: NEGATIVE mg/dL
Specific Gravity, Urine: 1.005 (ref 1.005–1.030)
pH: 5 (ref 5.0–8.0)

## 2024-08-25 LAB — GLUCOSE, CAPILLARY
Glucose-Capillary: 151 mg/dL — ABNORMAL HIGH (ref 70–99)
Glucose-Capillary: 116 mg/dL — ABNORMAL HIGH (ref 70–99)
Glucose-Capillary: 110 mg/dL — ABNORMAL HIGH (ref 70–99)
Glucose-Capillary: 186 mg/dL — ABNORMAL HIGH (ref 70–99)

## 2024-08-25 MED ORDER — LORAZEPAM 1 MG PO TABS
1.0000 mg | ORAL_TABLET | Freq: Once | ORAL | Status: AC
  Administered 2024-08-25: 1 mg via ORAL
  Filled 2024-08-25: qty 1

## 2024-08-25 NOTE — Progress Notes (Signed)
 Progress Note    [name]   FMW:[id number]  DOB: 1956-03-17  DOA: 08/11/2024     14 PCP: [name], MD  Initial CC: fall  Hospital Course: Patient is a 68 year old male with PMH obesity, anxiety/depression, schizoaffective disorder, bowel obstruction, anemia, colon polyps, constipation, GERD, DM II, HLD, HTN, memory loss, seizure disorder, skin cancer. He presented after a fall and was found to have a fracture involving the right distal fibula and medial malleolus. He was evaluated by orthopedic surgery and underwent repair on 08/13/2024.   Assessment and plan  Right ankle fracture Patient seen by orthopedics and underwent surgery on 8/28.  Pain seems to be reasonably well-controlled.  PT OT eval. SNF is recommended for short-term rehab -Disposition unclear.  Medicaid pending  Acute respiratory failure with hypoxia-resolved Aspiration pneumonia-resolved Postoperatively the patient could not be weaned off of oxygen.  Chest x-ray suggested left lung infiltrate.  It is quite possible patient may have aspirated.  Recently done echocardiogram showed normal LVEF with grade 1 diastolic dysfunction. - Also treated with dose of Lasix  -S/p course of antibiotics -Now weaned off oxygen and remains on room air   Acute urinary retention - may be from deconditioned state as well - s/p foley; discontinued on 9/5 and voiding okay since - did have recurrent episode of urinary retention on 9/9 requiring straight cath - Continue monitoring for any further recurrence of retention - UA checked and negative for signs of infection on 9/9  Constipation - Continue as needed laxatives   Hyponatremia - resolved  Likely due to mild hypovolemia.     Grade 1 diastolic dysfunction/history of mitral regurgitation Stable from a cardiac standpoint.  Echocardiogram shows LVEF of 50 to 55%.  Grade 1 diastolic dysfunction noted.  No significant valvular abnormalities. Volume status is stable.    Essential hypertension -Continue Toprol    Normocytic anemia Mild drop in hemoglobin is most likely dilutional.  Subsequently stable.  Checking labs periodically.   Hyperlipidemia Continue statin.   Seizure disorder Continue lacosamide .   Chronic constipation Continue bowel regimen.   Diabetes mellitus type 2 Noted to be on metformin  which is being continued.  SSI.  Monitor CBGs. HbA1c is 6.9.   Sleep apnea CPAP.  Requiring oxygen at low-dose.   Class I obesity Estimated body mass index is 32.82 kg/m as calculated from the following:   Height as of this encounter: 5' 9 (1.753 m).   Weight as of this encounter: 100.8 kg.     Estimated body mass index is 32.82 kg/m as calculated from the following:   Height as of this encounter: 5' 9 (1.753 m).   Weight as of this encounter: 100.8 kg.  Interval History:  No events overnight.  Resting comfortably in bed.  Disposition still being figured out.   Old records reviewed in assessment of this patient  Antimicrobials:   DVT prophylaxis:  SCDs Start: 08/13/24 1850 SCDs Start: 08/11/24 1131   Code Status:   Code Status: Full Code  Mobility Assessment (Last 72 Hours)     Mobility Assessment     Row Name 08/25/24 0808 08/24/24 2300 08/24/24 1100 08/24/24 1057 08/24/24 0000   Does the patient have exclusion criteria? No - Perform mobility assessment No - Perform mobility assessment -- No - Perform mobility assessment No - Perform mobility assessment   What is the highest level of mobility based on the mobility assessment? Level 4 (Ambulates with assistance) - Balance while stepping forward/back - Complete Level 4 (  Ambulates with assistance) - Balance while stepping forward/back - Complete Level 3 (Stands with assistance) - Balance while standing  and cannot march in place Level 4 (Ambulates with assistance) - Balance while stepping forward/back - Complete Level 4 (Ambulates with assistance) - Balance while stepping  forward/back - Complete   Is the above level different from baseline mobility prior to current illness? Yes - Recommend PT order Yes - Recommend PT order -- Yes - Recommend PT order Yes - Recommend PT order    Row Name 08/23/24 0900 08/23/24 0000         Does the patient have exclusion criteria? No - Perform mobility assessment No - Perform mobility assessment      What is the highest level of mobility based on the mobility assessment? Level 4 (Ambulates with assistance) - Balance while stepping forward/back - Complete Level 4 (Ambulates with assistance) - Balance while stepping forward/back - Complete      Is the above level different from baseline mobility prior to current illness? Yes - Recommend PT order Yes - Recommend PT order         Barriers to discharge: Unclear discharge plans Disposition Plan: TBD HH orders placed: TBD Status is: Inpatient  Objective: Blood pressure 133/74, pulse 82, temperature 98.7 F (37.1 C), resp. rate 17, height 5' 9 (1.753 m), weight 100.8 kg, SpO2 94%.  Examination:  Physical Exam Constitutional:      General: He is not in acute distress.    Appearance: Normal appearance.  HENT:     Head: Normocephalic and atraumatic.     Mouth/Throat:     Mouth: Mucous membranes are moist.  Eyes:     Extraocular Movements: Extraocular movements intact.  Cardiovascular:     Rate and Rhythm: Normal rate and regular rhythm.  Pulmonary:     Effort: Pulmonary effort is normal. No respiratory distress.     Breath sounds: Normal breath sounds. No wheezing.  Abdominal:     General: Bowel sounds are normal. There is no distension.     Palpations: Abdomen is soft.     Tenderness: There is no abdominal tenderness.  Musculoskeletal:     Cervical back: Normal range of motion and neck supple.     Comments: Right foot noted in surgical boot  Skin:    General: Skin is warm and dry.  Neurological:     General: No focal deficit present.     Mental Status: He is  alert.  Psychiatric:        Mood and Affect: Mood normal.        Behavior: Behavior normal.      Consultants:    Procedures:    Data Reviewed: Results for orders placed or performed during the hospital encounter of 08/11/24 (from the past 24 hours)  Glucose, capillary     Status: Abnormal   Collection Time: 08/24/24  4:28 PM  Result Value Ref Range   Glucose-Capillary 125 (H) 70 - 99 mg/dL  Glucose, capillary     Status: Abnormal   Collection Time: 08/24/24  9:30 PM  Result Value Ref Range   Glucose-Capillary 162 (H) 70 - 99 mg/dL  Glucose, capillary     Status: Abnormal   Collection Time: 08/25/24  7:43 AM  Result Value Ref Range   Glucose-Capillary 151 (H) 70 - 99 mg/dL  Glucose, capillary     Status: Abnormal   Collection Time: 08/25/24 11:55 AM  Result Value Ref Range   Glucose-Capillary 116 (H) 70 -  99 mg/dL  Urinalysis, Routine w reflex microscopic -Urine, Clean Catch     Status: Abnormal   Collection Time: 08/25/24 12:51 PM  Result Value Ref Range   Color, Urine YELLOW YELLOW   APPearance CLEAR CLEAR   Specific Gravity, Urine 1.005 1.005 - 1.030   pH 5.0 5.0 - 8.0   Glucose, UA NEGATIVE NEGATIVE mg/dL   Hgb urine dipstick SMALL (A) NEGATIVE   Bilirubin Urine NEGATIVE NEGATIVE   Ketones, ur NEGATIVE NEGATIVE mg/dL   Protein, ur NEGATIVE NEGATIVE mg/dL   Nitrite NEGATIVE NEGATIVE   Leukocytes,Ua NEGATIVE NEGATIVE   RBC / HPF 0-5 0 - 5 RBC/hpf   WBC, UA 0-5 0 - 5 WBC/hpf   Bacteria, UA NONE SEEN NONE SEEN   Squamous Epithelial / HPF 0-5 0 - 5 /HPF   Mucus PRESENT    *Note: Due to a large number of results and/or encounters for the requested time period, some results have not been displayed. A complete set of results can be found in Results Review.    I have reviewed pertinent nursing notes, vitals, labs, and images as necessary. I have ordered labwork to follow up on as indicated.  I have reviewed the last notes from staff over past 24 hours. I have  discussed patient's care plan and test results with nursing staff, CM/SW, and other staff as appropriate.  Time spent: Greater than 50% of the 55 minute visit was spent in counseling/coordination of care for the patient as laid out in the A&P.   LOS: 14 days   Alm Apo, MD [name] 08/25/2024, 2:03 PM

## 2024-08-25 NOTE — Progress Notes (Signed)
   08/25/24 2006  BiPAP/CPAP/SIPAP  BiPAP/CPAP/SIPAP Pt Type Adult  Reason BIPAP/CPAP not in use Non-compliant (Pt refusing cpap)

## 2024-08-25 NOTE — Progress Notes (Signed)
 Occupational Therapy Treatment Patient Details Name: [name] MRN: [id number] DOB: 1956-08-16 Today's Date: 08/25/2024   History of present illness [name] is a 68 yr old male admitted 08/11/24 with R ankle fracture after a fall. He is s/p open treatment of right trimalleolar ankle fracture without posterior fixation on 08-13-24. PMH: anxiety, depression, schizoaffective disorder, bowel obstruction, chronic anemia, colon polyps, constipation, GERD, type 2 diabetes, hyperlipidemia, hypertension, memory loss, MRSA carrier, history of pneumonia, seizure disorder, skin cancer who was brought to the emergency department after he had a fall.   OT comments  The pt performed supine to sit with supervision. He presented with good sitting balance EOB, while being instructed on and performing therapeutic exercises for strengthening needed to facilitate progressive ADL performance. He subsequently required CGA using a RW to perform two sit to stand transfers. He was noted to be with unsteadiness in standing and required intermittent cues to maintain RLE NWB, as he did not do so 100% of the time. He had decreased insight and required cues for general safety an falls risk reduction strategies during activity. Without further OT services, he is at high risk for restricted participation in meaningful activities and falls. Patient will benefit from continued inpatient follow up therapy, <3 hours/day.       If plan is discharge home, recommend the following:  A little help with walking and/or transfers;A lot of help with bathing/dressing/bathroom;Assistance with cooking/housework   Equipment Recommendations  Other (comment) (defer to next setting)    Recommendations for Other Services      Precautions / Restrictions Precautions Precautions: Fall Precaution/Restrictions Comments: pt continues to require cues for R LE NWB Restrictions Weight Bearing Restrictions Per Provider Order: Yes RLE Weight Bearing  Per Provider Order: Non weight bearing       Mobility Bed Mobility Overal bed mobility: Needs Assistance Bed Mobility: Sit to Supine, Supine to Sit     Supine to sit: Supervision, Used rails, HOB elevated Sit to supine: Supervision        Transfers Overall transfer level: Needs assistance Equipment used: Rolling walker (2 wheels) Transfers: Sit to/from Stand Sit to Stand: Contact guard assist, From elevated surface                 Balance     Sitting balance-Leahy Scale: Good       Standing balance-Leahy Scale: Poor            ADL either performed or assessed with clinical judgement   ADL Overall ADL's : Needs assistance/impaired Eating/Feeding: Independent;Sitting   Grooming: Set up;Sitting                  Communication Communication Communication: No apparent difficulties   Cognition Arousal: Alert Behavior During Therapy: WFL for tasks assessed/performed, Impulsive, Anxious       Awareness: Online awareness impaired     Executive functioning impairment (select all impairments): Reasoning OT - Cognition Comments: decreased insight into deficits, decreased safety awareness                 Following commands: Intact                      Pertinent Vitals/ Pain       Pain Assessment Pain Assessment: No/denies pain   Frequency  Min 2X/week        Progress Toward Goals  OT Goals(current goals can now be found in the care plan section)  Acute Rehab OT Goals Patient Stated Goal: to return home soon OT Goal Formulation: With patient Time For Goal Achievement: 08/28/24 Potential to Achieve Goals: Good  Plan         AM-PAC OT 6 Clicks Daily Activity     Outcome Measure   Help from another person eating meals?: None Help from another person taking care of personal grooming?: A Little Help from another person toileting, which includes using toliet, bedpan, or urinal?: A Lot Help from another person bathing  (including washing, rinsing, drying)?: A Lot Help from another person to put on and taking off regular upper body clothing?: A Little Help from another person to put on and taking off regular lower body clothing?: None 6 Click Score: 18    End of Session Equipment Utilized During Treatment: Gait belt;Rolling walker (2 wheels)  OT Visit Diagnosis: Other abnormalities of gait and mobility (R26.89);History of falling (Z91.81);Muscle weakness (generalized) (M62.81);Other symptoms and signs involving cognitive function   Activity Tolerance Patient limited by fatigue   Patient Left in bed;with call bell/phone within reach;with bed alarm set   Nurse Communication Mobility status        Time: 1453-1510 OT Time Calculation (min): 17 min  Charges: OT General Charges $OT Visit: 1 Visit OT Treatments $Therapeutic Activity: 8-22 mins     Delanna JINNY Lesches, OTR/L 08/25/2024, 5:04 PM

## 2024-08-25 NOTE — Hospital Course (Addendum)
 Patient is a 68 year old male with PMH obesity, anxiety/depression, schizoaffective disorder, bowel obstruction, anemia, colon polyps, constipation, GERD, DM II, HLD, HTN, memory loss, seizure disorder, skin cancer. He presented after a fall and was found to have a fracture involving the right distal fibula and medial malleolus. He was evaluated by orthopedic surgery and underwent repair on 08/13/2024.   Assessment and plan  Right ankle fracture Patient seen by orthopedics and underwent surgery on 8/28.  Pain seems to be reasonably well-controlled.  PT OT eval. SNF is recommended for short-term rehab - discussed with orthopedic surgery; okay to continue splint on and leave sutures in for now - continue NWB to RLE for at least 6-8 weeks after discussion with ortho also - ortho follow up was to be 2 weeks post op; aware still hospitalized; outpt followup still planned  - Disposition/discharge planning clarified on 08/28/2024 after discussion with patient.  Also had [name] on the phone during conversation.  He is now willing and wanting to go to rehab at discharge which is being pursued - d/c to [facility]   Acute urinary retention  - may be from deconditioned state as well - s/p foley; discontinued on 9/5 and was voiding okay for a few days - recurrent retention starting 9/9 and ultimately foley placed again on 9/10; had to be swapped on 9/11 after he developed acute pelvic pain of unclear etiology but resolved when foley was replaced with new - needs to have foley in place now for ~ 2 weeks  - outpatient urology arranged for 9/29 for repeat TOV  Acute respiratory failure with hypoxia-resolved Aspiration pneumonia-resolved Postoperatively the patient could not be weaned off of oxygen.  Chest x-ray suggested left lung infiltrate.  It is quite possible patient may have aspirated.  Recently done echocardiogram showed normal LVEF with grade 1 diastolic dysfunction. - Also treated with dose of  Lasix  -S/p course of antibiotics -Now weaned off oxygen and remains on room air  Constipation - Continue as needed laxatives   Hyponatremia - resolved  Likely due to mild hypovolemia   Grade 1 diastolic dysfunction/history of mitral regurgitation Stable from a cardiac standpoint.  Echocardiogram shows LVEF of 50 to 55%.  Grade 1 diastolic dysfunction noted.  No significant valvular abnormalities. Volume status is stable.   Essential hypertension -Continue Toprol    Normocytic anemia Mild drop in hemoglobin is most likely dilutional.  Subsequently stable.  Checking labs periodically   Hyperlipidemia Continue statin   Seizure disorder Continue lacosamide    Chronic constipation Continue bowel regimen   Diabetes mellitus type 2 Noted to be on metformin  which is being continued.  SSI.  Monitor CBGs HbA1c is 6.9   Sleep apnea CPAP.  Requiring oxygen at low-dose   Class I obesity Estimated body mass index is 32.82 kg/m as calculated from the following   Height as of this encounter: 5' 9 (1.753 m)   Weight as of this encounter: 100.8 kg    Estimated body mass index is 32.82 kg/m as calculated from the following:   Height as of this encounter: 5' 9 (1.753 m).   Weight as of this encounter: 100.8 kg.

## 2024-08-25 NOTE — Plan of Care (Signed)

## 2024-08-25 NOTE — Plan of Care (Signed)

## 2024-08-25 NOTE — TOC Transition Note (Deleted)
 Transition of Care [facility]) - Discharge Note   Patient Details  Name: [name] MRN: [id number] Date of Birth: 1956/04/23  [facility] CM/SW Contact:  Alfonse JONELLE Rex, RN Phone Number: 08/25/2024, 12:40 PM   Clinical Narrative:  No SNF bed offers to date. NCM sent request to [facility] Management requesting follow up with [facility] to re-review for bed offer.        Barriers to Discharge: Continued Medical Work up   Patient Goals and CMS Choice            Discharge Placement                       Discharge Plan and Services Additional resources added to the After Visit Summary for                                       Social Drivers of Health (SDOH) Interventions SDOH Screenings   Food Insecurity: Food Insecurity Present (08/11/2024)  Housing: Low Risk  (08/11/2024)  Transportation Needs: Unmet Transportation Needs (08/11/2024)  Utilities: At Risk (08/11/2024)  Alcohol Screen: Low Risk  (08/04/2018)  Depression (PHQ2-9): High Risk (07/23/2024)  Financial Resource Strain: Low Risk  (08/04/2018)  Physical Activity: Inactive (08/04/2018)  Social Connections: Unknown (08/11/2024)  Stress: No Stress Concern Present (08/04/2018)  Tobacco Use: Low Risk  (08/13/2024)     Readmission Risk Interventions    08/21/2024    1:50 PM  Readmission Risk Prevention Plan  Transportation Screening Complete  PCP or Specialist Appt within 3-5 Days Complete  HRI or Home Care Consult Complete  Social Work Consult for Recovery Care Planning/Counseling Complete  Palliative Care Screening Not Applicable  Medication Review Oceanographer) Complete

## 2024-08-25 NOTE — TOC Progression Note (Signed)
 Transition of Care [facility]) - Progression Note    Patient Details  Name: [name] MRN: [id number] Date of Birth: [birth year]  Transition of Care [facility]) CM/SW Contact  Alfonse JONELLE Rex, RN Phone Number: 08/25/2024, 4:11 PM  Clinical Narrative:   No SNF bed offers to date, IP Care Management Senior team notified. Per IP Care Management Director, short term rehab/SNF request re-sent to [facility], await determination.       Barriers to Discharge: Continued Medical Work up               Expected Discharge Plan and Services                                               Social Drivers of Health (SDOH) Interventions SDOH Screenings   Food Insecurity: Food Insecurity Present (08/11/2024)  Housing: Low Risk  (08/11/2024)  Transportation Needs: Unmet Transportation Needs (08/11/2024)  Utilities: At Risk (08/11/2024)  Alcohol Screen: Low Risk  (08/04/2018)  Depression (PHQ2-9): High Risk (07/23/2024)  Financial Resource Strain: Low Risk  (08/04/2018)  Physical Activity: Inactive (08/04/2018)  Social Connections: Unknown (08/11/2024)  Stress: No Stress Concern Present (08/04/2018)  Tobacco Use: Low Risk  (08/13/2024)    Readmission Risk Interventions    08/21/2024    1:50 PM  Readmission Risk Prevention Plan  Transportation Screening Complete  PCP or Specialist Appt within 3-5 Days Complete  HRI or Home Care Consult Complete  Social Work Consult for Recovery Care Planning/Counseling Complete  Palliative Care Screening Not Applicable  Medication Review Oceanographer) Complete

## 2024-08-26 LAB — GLUCOSE, CAPILLARY
Glucose-Capillary: 133 mg/dL — ABNORMAL HIGH (ref 70–99)
Glucose-Capillary: 171 mg/dL — ABNORMAL HIGH (ref 70–99)
Glucose-Capillary: 105 mg/dL — ABNORMAL HIGH (ref 70–99)
Glucose-Capillary: 107 mg/dL — ABNORMAL HIGH (ref 70–99)

## 2024-08-26 MED ORDER — CHLORHEXIDINE GLUCONATE CLOTH 2 % EX PADS
6.0000 | MEDICATED_PAD | Freq: Every day | CUTANEOUS | Status: DC
  Administered 2024-08-26 – 2024-08-31 (×6): 6 via TOPICAL

## 2024-08-26 NOTE — Progress Notes (Signed)
 Progress Note    [name]   FMW:[id number]  DOB: 04/05/1956  DOA: 08/11/2024     15 PCP: [name], MD  Initial CC: fall  Hospital Course: Patient is a 68 year old male with PMH obesity, anxiety/depression, schizoaffective disorder, bowel obstruction, anemia, colon polyps, constipation, GERD, DM II, HLD, HTN, memory loss, seizure disorder, skin cancer. He presented after a fall and was found to have a fracture involving the right distal fibula and medial malleolus. He was evaluated by orthopedic surgery and underwent repair on 08/13/2024.   Assessment and plan  Right ankle fracture Patient seen by orthopedics and underwent surgery on 8/28.  Pain seems to be reasonably well-controlled.  PT OT eval. SNF is recommended for short-term rehab - continue NWB to RLE - ortho follow up was to be 2 weeks post op; will inform patient still in hospital  -Disposition unclear.  Medicaid pending  Acute respiratory failure with hypoxia-resolved Aspiration pneumonia-resolved Postoperatively the patient could not be weaned off of oxygen.  Chest x-ray suggested left lung infiltrate.  It is quite possible patient may have aspirated.  Recently done echocardiogram showed normal LVEF with grade 1 diastolic dysfunction. - Also treated with dose of Lasix  -S/p course of antibiotics -Now weaned off oxygen and remains on room air   Acute urinary retention - resolved  - may be from deconditioned state as well - s/p foley; discontinued on 9/5 and voiding okay since - did have recurrent episode of urinary retention on 9/9 requiring straight cath - Continue monitoring for any further recurrence of retention - UA checked and negative for signs of infection on 9/9  Constipation - Continue as needed laxatives   Hyponatremia - resolved  Likely due to mild hypovolemia   Grade 1 diastolic dysfunction/history of mitral regurgitation Stable from a cardiac standpoint.  Echocardiogram shows LVEF of  50 to 55%.  Grade 1 diastolic dysfunction noted.  No significant valvular abnormalities. Volume status is stable.   Essential hypertension -Continue Toprol    Normocytic anemia Mild drop in hemoglobin is most likely dilutional.  Subsequently stable.  Checking labs periodically   Hyperlipidemia Continue statin   Seizure disorder Continue lacosamide    Chronic constipation Continue bowel regimen   Diabetes mellitus type 2 Noted to be on metformin  which is being continued.  SSI.  Monitor CBGs HbA1c is 6.9   Sleep apnea CPAP.  Requiring oxygen at low-dose   Class I obesity Estimated body mass index is 32.82 kg/m as calculated from the following   Height as of this encounter: 5' 9 (1.753 m)   Weight as of this encounter: 100.8 kg    Estimated body mass index is 32.82 kg/m as calculated from the following:   Height as of this encounter: 5' 9 (1.753 m).   Weight as of this encounter: 100.8 kg.  Interval History:  No events overnight.  Resting comfortably in bed.  Disposition still being figured out.   Old records reviewed in assessment of this patient  Antimicrobials:   DVT prophylaxis:  SCDs Start: 08/13/24 1850 SCDs Start: 08/11/24 1131   Code Status:   Code Status: Full Code  Mobility Assessment (Last 72 Hours)     Mobility Assessment     Row Name 08/26/24 1252 08/25/24 1915 08/25/24 1702 08/25/24 0808 08/24/24 2300   Does the patient have exclusion criteria? No - Perform mobility assessment No - Perform mobility assessment -- No - Perform mobility assessment No - Perform mobility assessment   What is the  highest level of mobility based on the mobility assessment? Level 4 (Ambulates with assistance) - Balance while stepping forward/back - Complete Level 3 (Stands with assistance) - Balance while standing  and cannot march in place Level 3 (Stands with assistance) - Balance while standing  and cannot march in place Level 4 (Ambulates with assistance) - Balance  while stepping forward/back - Complete Level 4 (Ambulates with assistance) - Balance while stepping forward/back - Complete   Is the above level different from baseline mobility prior to current illness? -- Yes - Recommend PT order -- Yes - Recommend PT order Yes - Recommend PT order    Row Name 08/24/24 1100 08/24/24 1057 08/24/24 0000       Does the patient have exclusion criteria? -- No - Perform mobility assessment No - Perform mobility assessment     What is the highest level of mobility based on the mobility assessment? Level 3 (Stands with assistance) - Balance while standing  and cannot march in place Level 4 (Ambulates with assistance) - Balance while stepping forward/back - Complete Level 4 (Ambulates with assistance) - Balance while stepping forward/back - Complete     Is the above level different from baseline mobility prior to current illness? -- Yes - Recommend PT order Yes - Recommend PT order        Barriers to discharge: Unclear discharge plans Disposition Plan: TBD HH orders placed: TBD Status is: Inpatient  Objective: Blood pressure (!) 116/59, pulse 70, temperature 97.6 F (36.4 C), temperature source Oral, resp. rate 20, height 5' 9 (1.753 m), weight 100.8 kg, SpO2 94%.  Examination:  Physical Exam Constitutional:      General: He is not in acute distress.    Appearance: Normal appearance.  HENT:     Head: Normocephalic and atraumatic.     Mouth/Throat:     Mouth: Mucous membranes are moist.  Eyes:     Extraocular Movements: Extraocular movements intact.  Cardiovascular:     Rate and Rhythm: Normal rate and regular rhythm.  Pulmonary:     Effort: Pulmonary effort is normal. No respiratory distress.     Breath sounds: Normal breath sounds. No wheezing.  Abdominal:     General: Bowel sounds are normal. There is no distension.     Palpations: Abdomen is soft.     Tenderness: There is no abdominal tenderness.  Musculoskeletal:     Cervical back: Normal range  of motion and neck supple.     Comments: Right foot noted in surgical boot  Skin:    General: Skin is warm and dry.  Neurological:     General: No focal deficit present.     Mental Status: He is alert.  Psychiatric:        Mood and Affect: Mood normal.        Behavior: Behavior normal.      Consultants:    Procedures:    Data Reviewed: Results for orders placed or performed during the hospital encounter of 08/11/24 (from the past 24 hours)  Glucose, capillary     Status: Abnormal   Collection Time: 08/25/24  5:20 PM  Result Value Ref Range   Glucose-Capillary 110 (H) 70 - 99 mg/dL  Glucose, capillary     Status: Abnormal   Collection Time: 08/25/24  9:47 PM  Result Value Ref Range   Glucose-Capillary 186 (H) 70 - 99 mg/dL  Glucose, capillary     Status: Abnormal   Collection Time: 08/26/24  8:05 AM  Result Value Ref Range   Glucose-Capillary 133 (H) 70 - 99 mg/dL  Glucose, capillary     Status: Abnormal   Collection Time: 08/26/24 12:06 PM  Result Value Ref Range   Glucose-Capillary 171 (H) 70 - 99 mg/dL   *Note: Due to a large number of results and/or encounters for the requested time period, some results have not been displayed. A complete set of results can be found in Results Review.    I have reviewed pertinent nursing notes, vitals, labs, and images as necessary. I have ordered labwork to follow up on as indicated.  I have reviewed the last notes from staff over past 24 hours. I have discussed patient's care plan and test results with nursing staff, CM/SW, and other staff as appropriate.  Time spent: Greater than 50% of the 55 minute visit was spent in counseling/coordination of care for the patient as laid out in the A&P.   LOS: 15 days   Alm Apo, MD [name] 08/26/2024, 2:15 PM

## 2024-08-26 NOTE — Plan of Care (Signed)
   Problem: Education: Goal: Knowledge of General Education information will improve Description: Including pain rating scale, medication(s)/side effects and non-pharmacologic comfort measures Outcome: Progressing   Problem: Nutrition: Goal: Adequate nutrition will be maintained Outcome: Progressing

## 2024-08-26 NOTE — Progress Notes (Signed)
   08/26/24 2007  BiPAP/CPAP/SIPAP  BiPAP/CPAP/SIPAP Pt Type Adult  Reason BIPAP/CPAP not in use  (pt refuses cpap)

## 2024-08-26 NOTE — Progress Notes (Signed)
 Physical Therapy Treatment Patient Details Name: [name] MRN: [id number] DOB: [birth year] Today's Date: 08/26/2024   History of Present Illness [name] is a 68 y.o. male admitted 08/11/24 with dx R ankle fx with medical history significant of anxiety, depression, schizoaffective disorder, bowel obstruction, chronic anemia, colon polyps, constipation, GERD, type 2 diabetes, hyperlipidemia, hypertension, memory loss, MRSA carrier, history of pneumonia, seizure disorder, skin cancer who was brought to the emergency department after he had a fall.    PT Comments  appears AxO x 3 but presents with impaired safety awareness and requiring VC's. Slightly slow/delayed response but following all commands with increased time. Nervous.   Assisted OOB to wheelchair.  General transfer comment: Pt required MAX set up of wheelchair which included removing leg rests and locking wc brakes.  Instructed on proper wc to bed position (close as possible) and ideally  on Pt's LEFT side.  Pt was able to perform a stand pivot all weight on his L LE and complete a 1/4 turn from elevated to wc NO AD such that he caould use his hands to steady self partial upright stance pivot.  Pt was also able to perform one sit to stand with walker at NWB R LE to place a pillow in wc seat (Pt request). Left Pt in wc with R LE elevated. LPT has rec Pt will need ST Rehab at SNF to address mobility and functional decline prior to safely returning home.    If plan is discharge home, recommend the following: Two people to help with walking and/or transfers;A lot of help with bathing/dressing/bathroom;Assistance with cooking/housework;Assist for transportation;Help with stairs or ramp for entrance   Can travel by private vehicle     No  Equipment Recommendations       Recommendations for Other Services       Precautions / Restrictions Precautions Precautions: Fall Precaution/Restrictions Comments: pt continues to require cues  for R LE NWB Restrictions Weight Bearing Restrictions Per Provider Order: Yes RLE Weight Bearing Per Provider Order: Non weight bearing     Mobility  Bed Mobility Overal bed mobility: Needs Assistance Bed Mobility: Supine to Sit     Supine to sit: Supervision     General bed mobility comments: Pt self able with use of rail and momentum    Transfers Overall transfer level: Needs assistance Equipment used: None Transfers: Bed to chair/wheelchair/BSC             General transfer comment: Pt required MAX set up of wheelchair which included removing leg rests and locking wc brakes.  Instructed on proper wc to bed position (close as possible) and ideally  on Pt's LEFT side.  Pt was able to perform a stand pivot all weight on his L LE and complete a 1/4 turn from elevated to wc NO AD such that he caould use his hands to steady self partial upright stance pivot.  Pt was also able to perform one sit to stand with walker at NWB R LE to place a pillow in wc seat (Pt request).    Ambulation/Gait               General Gait Details: practiced transfers and wc mobility this session/NO gait   Psychologist, counselling mobility: Yes Wheelchair propulsion: Both upper extremities, Left lower extremity Distance: 125 Wheelchair Assistance Details (indicate cue type and reason): pt required decreased cues and no  physical assist for wc mobiltiy today with a focus on turns, retrograde propelling and obstacle navigation in addition to management of wc brakes, and R leg rest pt will require reinforcement and ongoing ed for wc management and mobility   Tilt Bed    Modified Rankin (Stroke Patients Only)       Balance                                            Communication Communication Communication: No apparent difficulties  Cognition Arousal: Alert Behavior During Therapy: WFL for tasks assessed/performed    PT - Cognitive impairments: Sequencing, Problem solving, Safety/Judgement                       PT - Cognition Comments: appears AxO x 3 but presents with impaired safety awareness and requiring VC's. Following commands: Intact      Cueing Cueing Techniques: Verbal cues, Visual cues, Tactile cues, Gestural cues  Exercises      General Comments        Pertinent Vitals/Pain Pain Assessment Pain Assessment: Faces Faces Pain Scale: Hurts a little bit Pain Location: R distal LE Pain Descriptors / Indicators: Discomfort Pain Intervention(s): Repositioned    Home Living                          Prior Function            PT Goals (current goals can now be found in the care plan section) Progress towards PT goals: Progressing toward goals    Frequency    Min 3X/week      PT Plan      Co-evaluation              AM-PAC PT 6 Clicks Mobility   Outcome Measure  Help needed turning from your back to your side while in a flat bed without using bedrails?: A Little Help needed moving from lying on your back to sitting on the side of a flat bed without using bedrails?: A Little Help needed moving to and from a bed to a chair (including a wheelchair)?: A Little Help needed standing up from a chair using your arms (e.g., wheelchair or bedside chair)?: A Lot Help needed to walk in hospital room?: A Lot Help needed climbing 3-5 steps with a railing? : Total 6 Click Score: 14    End of Session Equipment Utilized During Treatment: Gait belt Activity Tolerance: No increased pain;Patient limited by fatigue Patient left: in chair;with call bell/phone within reach Nurse Communication: Mobility status PT Visit Diagnosis: Unsteadiness on feet (R26.81);Other abnormalities of gait and mobility (R26.89);Muscle weakness (generalized) (M62.81);History of falling (Z91.81);Difficulty in walking, not elsewhere classified (R26.2);Pain Pain - Right/Left: Right Pain  - part of body: Leg;Ankle and joints of foot     Time: 8357-8284 PT Time Calculation (min) (ACUTE ONLY): 33 min  Charges:    $Therapeutic Activity: 8-22 mins $Wheel Chair Management: 8-22 mins PT General Charges $$ ACUTE PT VISIT: 1 Visit                     Katheryn Leap  PTA Acute  Rehabilitation Services Office M-F          [phone]

## 2024-08-27 DIAGNOSIS — R338 Other retention of urine: Secondary | ICD-10-CM

## 2024-08-27 LAB — GLUCOSE, CAPILLARY
Glucose-Capillary: 120 mg/dL — ABNORMAL HIGH (ref 70–99)
Glucose-Capillary: 133 mg/dL — ABNORMAL HIGH (ref 70–99)
Glucose-Capillary: 126 mg/dL — ABNORMAL HIGH (ref 70–99)
Glucose-Capillary: 146 mg/dL — ABNORMAL HIGH (ref 70–99)

## 2024-08-27 MED ORDER — MORPHINE SULFATE (PF) 4 MG/ML IV SOLN
4.0000 mg | INTRAVENOUS | Status: DC | PRN
  Administered 2024-08-27: 4 mg via INTRAVENOUS
  Filled 2024-08-27: qty 1

## 2024-08-27 NOTE — Plan of Care (Signed)

## 2024-08-27 NOTE — Plan of Care (Signed)
  Problem: Education: Goal: Knowledge of General Education information will improve Description: Including pain rating scale, medication(s)/side effects and non-pharmacologic comfort measures Outcome: Progressing   Problem: Activity: Goal: Risk for activity intolerance will decrease Outcome: Progressing   Problem: Pain Managment: Goal: General experience of comfort will improve and/or be controlled Outcome: Progressing

## 2024-08-27 NOTE — TOC Progression Note (Addendum)
 Transition of Care [facility]) - Progression Note    Patient Details  Name: [name] MRN: [id number] Date of Birth: 22-Oct-1956  [facility] CM/SW Contact  Alfonse JONELLE Rex, RN Phone Number: 08/27/2024, 10:43 AM  Clinical Narrative:   Call to Daril Gander, Director [facility], request re-review for short term rehab per request of IP Care Management Director, Daril to review with DON and call NCM with determination.   -3:00pm Call from patient's significant other, [name], reports she has arranged private duty care for patient 24/7 at discharge. Informed [name] that patient has been accepted to a SNF ([facility]), declines SNF, agreeable to [facility] PT/OT, already arranged with [facility], added to AVS. Team notified, per attending, place for dc tomorrow.       Barriers to Discharge: Continued Medical Work up               Expected Discharge Plan and Services                                               Social Drivers of Health (SDOH) Interventions SDOH Screenings   Food Insecurity: Food Insecurity Present (08/11/2024)  Housing: Low Risk  (08/11/2024)  Transportation Needs: Unmet Transportation Needs (08/11/2024)  Utilities: At Risk (08/11/2024)  Alcohol Screen: Low Risk  (08/04/2018)  Depression (PHQ2-9): High Risk (07/23/2024)  Financial Resource Strain: Low Risk  (08/04/2018)  Physical Activity: Inactive (08/04/2018)  Social Connections: Unknown (08/11/2024)  Stress: No Stress Concern Present (08/04/2018)  Tobacco Use: Low Risk  (08/13/2024)    Readmission Risk Interventions    08/21/2024    1:50 PM  Readmission Risk Prevention Plan  Transportation Screening Complete  PCP or Specialist Appt within 3-5 Days Complete  HRI or Home Care Consult Complete  Social Work Consult for Recovery Care Planning/Counseling Complete  Palliative Care Screening Not Applicable  Medication Review Oceanographer) Complete

## 2024-08-27 NOTE — Progress Notes (Signed)
   08/27/24 2037  BiPAP/CPAP/SIPAP  BiPAP/CPAP/SIPAP Pt Type Adult  Reason BIPAP/CPAP not in use Non-compliant

## 2024-08-27 NOTE — Progress Notes (Signed)
 Progress Note    [name]   FMW:[id number]  DOB: Nov 20, 1956  DOA: 08/11/2024     16 PCP: [name], MD  Initial CC: fall  Hospital Course: Patient is a 68 year old male with PMH obesity, anxiety/depression, schizoaffective disorder, bowel obstruction, anemia, colon polyps, constipation, GERD, DM II, HLD, HTN, memory loss, seizure disorder, skin cancer. He presented after a fall and was found to have a fracture involving the right distal fibula and medial malleolus. He was evaluated by orthopedic surgery and underwent repair on 08/13/2024.   Assessment and plan  Right ankle fracture Patient seen by orthopedics and underwent surgery on 8/28.  Pain seems to be reasonably well-controlled.  PT OT eval. SNF is recommended for short-term rehab - discussed with orthopedic surgery; okay to continue splint on and leave sutures in for now - continue NWB to RLE for at least 6-8 weeks after discussion with ortho also - ortho follow up was to be 2 weeks post op; aware still hospitalized; outpt followup still planned  -Disposition unclear.  Medicaid pending  Acute urinary retention  - may be from deconditioned state as well - s/p foley; discontinued on 9/5 and was voiding okay for a few days - recurrent retention starting 9/9 and ultimately foley placed again on 9/10 - needs to have foley in place now for ~ 2 weeks and will have to arrange for him to follow up with urology for repeat TOV outpatient   Acute respiratory failure with hypoxia-resolved Aspiration pneumonia-resolved Postoperatively the patient could not be weaned off of oxygen.  Chest x-ray suggested left lung infiltrate.  It is quite possible patient may have aspirated.  Recently done echocardiogram showed normal LVEF with grade 1 diastolic dysfunction. - Also treated with dose of Lasix  -S/p course of antibiotics -Now weaned off oxygen and remains on room air  Constipation - Continue as needed laxatives    Hyponatremia - resolved  Likely due to mild hypovolemia   Grade 1 diastolic dysfunction/history of mitral regurgitation Stable from a cardiac standpoint.  Echocardiogram shows LVEF of 50 to 55%.  Grade 1 diastolic dysfunction noted.  No significant valvular abnormalities. Volume status is stable.   Essential hypertension -Continue Toprol    Normocytic anemia Mild drop in hemoglobin is most likely dilutional.  Subsequently stable.  Checking labs periodically   Hyperlipidemia Continue statin   Seizure disorder Continue lacosamide    Chronic constipation Continue bowel regimen   Diabetes mellitus type 2 Noted to be on metformin  which is being continued.  SSI.  Monitor CBGs HbA1c is 6.9   Sleep apnea CPAP.  Requiring oxygen at low-dose   Class I obesity Estimated body mass index is 32.82 kg/m as calculated from the following   Height as of this encounter: 5' 9 (1.753 m)   Weight as of this encounter: 100.8 kg    Estimated body mass index is 32.82 kg/m as calculated from the following:   Height as of this encounter: 5' 9 (1.753 m).   Weight as of this encounter: 100.8 kg.  Interval History:  Ongoing urinary retention and Foley catheter was placed back yesterday.  Otherwise doing okay today.   Old records reviewed in assessment of this patient  Antimicrobials:   DVT prophylaxis:  SCDs Start: 08/13/24 1850 SCDs Start: 08/11/24 1131   Code Status:   Code Status: Full Code  Mobility Assessment (Last 72 Hours)     Mobility Assessment     Row Name 08/27/24 0800 08/26/24 2038 08/26/24 1738 08/26/24  1252 08/25/24 1915   Does the patient have exclusion criteria? No - Perform mobility assessment No - Perform mobility assessment -- No - Perform mobility assessment No - Perform mobility assessment   What is the highest level of mobility based on the mobility assessment? Level 3 (Stands with assistance) - Balance while standing  and cannot march in place Level 3 (Stands  with assistance) - Balance while standing  and cannot march in place Level 3 (Stands with assistance) - Balance while standing  and cannot march in place Level 4 (Ambulates with assistance) - Balance while stepping forward/back - Complete Level 3 (Stands with assistance) - Balance while standing  and cannot march in place   Is the above level different from baseline mobility prior to current illness? Yes - Recommend PT order Yes - Recommend PT order -- -- Yes - Recommend PT order    Row Name 08/25/24 1702 08/25/24 0808 08/24/24 2300       Does the patient have exclusion criteria? -- No - Perform mobility assessment No - Perform mobility assessment     What is the highest level of mobility based on the mobility assessment? Level 3 (Stands with assistance) - Balance while standing  and cannot march in place Level 4 (Ambulates with assistance) - Balance while stepping forward/back - Complete Level 4 (Ambulates with assistance) - Balance while stepping forward/back - Complete     Is the above level different from baseline mobility prior to current illness? -- Yes - Recommend PT order Yes - Recommend PT order        Barriers to discharge: Unclear discharge plans Disposition Plan: TBD HH orders placed: TBD Status is: Inpatient  Objective: Blood pressure (!) 116/59, pulse (!) 58, temperature 97.8 F (36.6 C), temperature source Oral, resp. rate 16, height 5' 9 (1.753 m), weight 100.8 kg, SpO2 95%.  Examination:  Physical Exam Constitutional:      General: He is not in acute distress.    Appearance: Normal appearance.  HENT:     Head: Normocephalic and atraumatic.     Mouth/Throat:     Mouth: Mucous membranes are moist.  Eyes:     Extraocular Movements: Extraocular movements intact.  Cardiovascular:     Rate and Rhythm: Normal rate and regular rhythm.  Pulmonary:     Effort: Pulmonary effort is normal. No respiratory distress.     Breath sounds: Normal breath sounds. No wheezing.   Abdominal:     General: Bowel sounds are normal. There is no distension.     Palpations: Abdomen is soft.     Tenderness: There is no abdominal tenderness.  Musculoskeletal:     Cervical back: Normal range of motion and neck supple.     Comments: Right foot noted in surgical boot  Skin:    General: Skin is warm and dry.  Neurological:     General: No focal deficit present.     Mental Status: He is alert.  Psychiatric:        Mood and Affect: Mood normal.        Behavior: Behavior normal.      Consultants:    Procedures:    Data Reviewed: Results for orders placed or performed during the hospital encounter of 08/11/24 (from the past 24 hours)  Glucose, capillary     Status: Abnormal   Collection Time: 08/26/24  4:39 PM  Result Value Ref Range   Glucose-Capillary 105 (H) 70 - 99 mg/dL  Glucose, capillary  Status: Abnormal   Collection Time: 08/26/24 10:11 PM  Result Value Ref Range   Glucose-Capillary 107 (H) 70 - 99 mg/dL  Glucose, capillary     Status: Abnormal   Collection Time: 08/27/24  7:19 AM  Result Value Ref Range   Glucose-Capillary 120 (H) 70 - 99 mg/dL  Glucose, capillary     Status: Abnormal   Collection Time: 08/27/24 11:12 AM  Result Value Ref Range   Glucose-Capillary 133 (H) 70 - 99 mg/dL   *Note: Due to a large number of results and/or encounters for the requested time period, some results have not been displayed. A complete set of results can be found in Results Review.    I have reviewed pertinent nursing notes, vitals, labs, and images as necessary. I have ordered labwork to follow up on as indicated.  I have reviewed the last notes from staff over past 24 hours. I have discussed patient's care plan and test results with nursing staff, CM/SW, and other staff as appropriate.  Time spent: Greater than 50% of the 55 minute visit was spent in counseling/coordination of care for the patient as laid out in the A&P.   LOS: 16 days   Alm Apo, MD [name] 08/27/2024, 1:43 PM

## 2024-08-28 ENCOUNTER — Other Ambulatory Visit (HOSPITAL_COMMUNITY): Payer: Self-pay

## 2024-08-28 LAB — GLUCOSE, CAPILLARY
Glucose-Capillary: 134 mg/dL — ABNORMAL HIGH (ref 70–99)
Glucose-Capillary: 115 mg/dL — ABNORMAL HIGH (ref 70–99)
Glucose-Capillary: 133 mg/dL — ABNORMAL HIGH (ref 70–99)
Glucose-Capillary: 120 mg/dL — ABNORMAL HIGH (ref 70–99)

## 2024-08-28 MED ORDER — TAMSULOSIN HCL 0.4 MG PO CAPS
0.4000 mg | ORAL_CAPSULE | Freq: Every day | ORAL | 2 refills | Status: AC
  Filled 2024-08-28: qty 90, 90d supply, fill #0

## 2024-08-28 MED ORDER — ACETAMINOPHEN 325 MG PO TABS: 650.0000 mg | ORAL_TABLET | ORAL | Status: AC | PRN

## 2024-08-28 MED ORDER — OXYCODONE HCL 5 MG PO TABS
5.0000 mg | ORAL_TABLET | Freq: Four times a day (QID) | ORAL | 0 refills | Status: DC | PRN
  Filled 2024-08-28: qty 20, 5d supply, fill #0

## 2024-08-28 MED ORDER — METOPROLOL SUCCINATE ER 50 MG PO TB24
50.0000 mg | ORAL_TABLET | Freq: Every day | ORAL | 2 refills | Status: AC
  Filled 2024-08-28: qty 90, 90d supply, fill #0

## 2024-08-28 MED ORDER — ASPIRIN 325 MG PO TBEC
325.0000 mg | DELAYED_RELEASE_TABLET | Freq: Every day | ORAL | 1 refills | Status: AC
  Filled 2024-08-28: qty 30, 30d supply, fill #0

## 2024-08-28 NOTE — Progress Notes (Addendum)
 Physical Therapy Treatment Patient Details Name: [name] MRN: [id number] DOB: [birth year] Today's Date: 08/28/2024   History of Present Illness [name] is a 68 y.o. male admitted 08/11/24 with dx R ankle fx with medical history significant of anxiety, depression, schizoaffective disorder, bowel obstruction, chronic anemia, colon polyps, constipation, GERD, type 2 diabetes, hyperlipidemia, hypertension, memory loss, MRSA carrier, history of pneumonia, seizure disorder, skin cancer who was brought to the emergency department after he had a fall.    PT Comments  Pt will need 24/7 care if D/C to home..... after morning discussion with Dr Patsy and sharon on the the phone, Pt and [name] agreeable to SNF.  [name] is a girl friend for years but not POA.   Cognition Comments: appears AxO x 3 but presents with impaired safety awareness and requiring repeat VC's and Instructions.  Assisted OOB to wheelchair.  General transfer comment: Pt required MAX set up of wheelchair which included removing leg rests and locking wc brakes.  Instructed on proper wc to bed position (close as possible) and ideally  on Pt's LEFT side.  Pt was able to perform a stand pivot all weight on his L LE and complete a 1/4 turn from elevated to wc NO AD such that he caould use his hands to steady self partial upright stance pivot.  Pt was also able to perform one sit to stand with walker at NWB R LE to place a pillow in wc seat (Pt request). Required repeat instructions esp to lock brakes and proper hand placement. Assisted to bathroom where Pt was able to wash self up in a seated position at sink.  Required assist to stand briefly while maintaining NWBing R LE.  Naval architect mobility: Yes Distance: 145 Wheelchair Assistance Details (indicate cue type and reason): Required repeat safety VC's of locking brakes and safety with turns.  Pt was able to self propell wc in hallway but needed several rest  breaks due to fatigued and increased dyspnea on exertion.  this is harder than it looks, stated Pt.  Then assisted from wheelchair to recliner and elevate R LE.    If plan is discharge home, recommend the following: Two people to help with walking and/or transfers;A lot of help with bathing/dressing/bathroom;Assistance with cooking/housework;Assist for transportation;Help with stairs or ramp for entrance   Can travel by private vehicle     No  Equipment Recommendations       Recommendations for Other Services       Precautions / Restrictions Precautions Precautions: Fall Recall of Precautions/Restrictions: Impaired Precaution/Restrictions Comments: pt continues to require cues for R LE NWB Restrictions Weight Bearing Restrictions Per Provider Order: Yes RLE Weight Bearing Per Provider Order: Non weight bearing     Mobility  Bed Mobility Overal bed mobility: Needs Assistance       Supine to sit: Supervision     General bed mobility comments: Pt self able with use of rail and momentum    Transfers Overall transfer level: Needs assistance Equipment used: None Transfers: Bed to chair/wheelchair/BSC Sit to Stand: Contact guard assist, From elevated surface           General transfer comment: Pt required MAX set up of wheelchair which included removing leg rests and locking wc brakes.  Instructed on proper wc to bed position (close as possible) and ideally  on Pt's LEFT side.  Pt was able to perform a stand pivot all weight on his L LE and complete a 1/4 turn  from elevated to wc NO AD such that he caould use his hands to steady self partial upright stance pivot.  Pt was also able to perform one sit to stand with walker at NWB R LE to place a pillow in wc seat (Pt request). Required repeat instructions esp to lock brakes and proper hand placement.    Ambulation/Gait               General Gait Details: practiced transfers and wc mobility this session/NO  gait   Psychologist, counselling mobility: Yes Distance: 145 Wheelchair Assistance Details (indicate cue type and reason): Required repeat safety VC's of locking brakes and safety with turns.  Pt was able to self propell wc in hallway but needed several rest breaks due to fatigued and increased dyspnea on exertion.  this is harder than it looks, stated Pt.   Tilt Bed    Modified Rankin (Stroke Patients Only)       Balance                                            Communication Communication Communication: No apparent difficulties  Cognition Arousal: Alert Behavior During Therapy: WFL for tasks assessed/performed   PT - Cognitive impairments: Sequencing, Problem solving, Safety/Judgement                       PT - Cognition Comments: appears AxO x 3 but presents with impaired safety awareness and requiring repeat VC's and Instructions. Following commands: Impaired Following commands impaired: Follows one step commands with increased time, Follows one step commands inconsistently    Cueing Cueing Techniques: Verbal cues, Visual cues, Tactile cues, Gestural cues  Exercises      General Comments        Pertinent Vitals/Pain Pain Assessment Pain Assessment: Faces Faces Pain Scale: Hurts a little bit Pain Location: R distal LE Pain Descriptors / Indicators: Discomfort Pain Intervention(s): Monitored during session, Repositioned    Home Living                          Prior Function            PT Goals (current goals can now be found in the care plan section) Progress towards PT goals: Progressing toward goals    Frequency    Min 3X/week      PT Plan      Co-evaluation              AM-PAC PT 6 Clicks Mobility   Outcome Measure  Help needed turning from your back to your side while in a flat bed without using bedrails?: A Little Help needed moving  from lying on your back to sitting on the side of a flat bed without using bedrails?: A Little Help needed moving to and from a bed to a chair (including a wheelchair)?: A Little Help needed standing up from a chair using your arms (e.g., wheelchair or bedside chair)?: A Little Help needed to walk in hospital room?: A Lot Help needed climbing 3-5 steps with a railing? : Total 6 Click Score: 15    End of Session Equipment Utilized During Treatment: Gait belt Activity Tolerance: Patient limited by fatigue Patient left: in chair;with call  bell/phone within reach Nurse Communication: Mobility status PT Visit Diagnosis: Unsteadiness on feet (R26.81);Other abnormalities of gait and mobility (R26.89);Muscle weakness (generalized) (M62.81);History of falling (Z91.81);Difficulty in walking, not elsewhere classified (R26.2);Pain Pain - Right/Left: Right Pain - part of body: Leg;Ankle and joints of foot     Time: 1123-1218 PT Time Calculation (min) (ACUTE ONLY): 55 min  Charges:    $Therapeutic Activity: 23-37 mins $Wheel Chair Management: 8-22 mins PT General Charges $$ ACUTE PT VISIT: 1 Visit                     Katheryn Leap  PTA Acute  Rehabilitation Services Office M-F          [phone]

## 2024-08-28 NOTE — Plan of Care (Signed)

## 2024-08-28 NOTE — TOC Progression Note (Addendum)
 Transition of Care [facility]) - Progression Note    Patient Details  Name: [name] MRN: [id number] Date of Birth: [birth year]  [facility] CM/SW Contact  Sonda Manuella Quill, RN Phone Number: 08/28/2024, 3:02 PM  Clinical Narrative:    Orders received for recc for RW, wheel Chair, and 3-N-1; pt does not have insurance; RW donated to pt; spoke w/ pt's SO [name] [phone]) to discuss d/c plan; she said she is sick and cannot take care of pt at this time; he has also arranged 24/7 caregivers w/ [employer] [facility]; Ms Mu said she has been working w/ [employer], but she does not know when agency can start providing services; she was also notified HHPT/OT had been arranged w/ [name]; d/c address confirmed: [address], [address] [place], [place] [address]; she agreed to d/c plan; Dr patsy notified via secure chat.  -1134- notified by Dr patsy. Pt and SO agreed for him to d/c to SNF; pt previously accepted at [facility].  -1406- notified pt's SO would like to speak w/ RN, CM; will call as time permits  -1411 notified by [name], LPN pt's SO does not want him to d/c to SNF  -1545- pt A&O x 4; he requested SNF at [facility]; spoke w/ [name] in Admissions; she said pt can admit on Monday; LVM for pt's SO [name]; awaiting return call; facility also notified via SNF hub       Barriers to Discharge: Continued Medical Work up               Expected Discharge Plan and Services                                               Social Drivers of Health (SDOH) Interventions SDOH Screenings   Food Insecurity: Food Insecurity Present (08/11/2024)  Housing: Low Risk  (08/11/2024)  Transportation Needs: Unmet Transportation Needs (08/11/2024)  Utilities: At Risk (08/11/2024)  Alcohol Screen: Low Risk  (08/04/2018)  Depression (PHQ2-9): High Risk (07/23/2024)  Financial Resource Strain: Low Risk  (08/04/2018)  Physical Activity: Inactive (08/04/2018)  Social  Connections: Unknown (08/11/2024)  Stress: No Stress Concern Present (08/04/2018)  Tobacco Use: Low Risk  (08/13/2024)    Readmission Risk Interventions    08/21/2024    1:50 PM  Readmission Risk Prevention Plan  Transportation Screening Complete  PCP or Specialist Appt within 3-5 Days Complete  HRI or Home Care Consult Complete  Social Work Consult for Recovery Care Planning/Counseling Complete  Palliative Care Screening Not Applicable  Medication Review Oceanographer) Complete

## 2024-08-28 NOTE — Progress Notes (Signed)
 PHYSICAL THERAPY  Patient suffers from  RIGHT ANKLE FX which impairs their ability to perform daily activities like safely amb in the home.  A walker alone will not resolve the issues with performing activities of daily living. A wheelchair will allow patient to safely perform daily activities.  The patient can self propel in the home or has a caregiver who can provide assistance.     Katheryn Leap  PTA Acute  Rehabilitation Services Office M-F          [phone]

## 2024-08-28 NOTE — Progress Notes (Signed)
 Progress Note    [name]   FMW:[id number]  DOB: 1956/01/23  DOA: 08/11/2024     17 PCP: [name], MD  Initial CC: fall  Hospital Course: Patient is a 68 year old male with PMH obesity, anxiety/depression, schizoaffective disorder, bowel obstruction, anemia, colon polyps, constipation, GERD, DM II, HLD, HTN, memory loss, seizure disorder, skin cancer. He presented after a fall and was found to have a fracture involving the right distal fibula and medial malleolus. He was evaluated by orthopedic surgery and underwent repair on 08/13/2024.   Assessment and plan  Right ankle fracture Patient seen by orthopedics and underwent surgery on 8/28.  Pain seems to be reasonably well-controlled.  PT OT eval. SNF is recommended for short-term rehab - discussed with orthopedic surgery; okay to continue splint on and leave sutures in for now - continue NWB to RLE for at least 6-8 weeks after discussion with ortho also - ortho follow up was to be 2 weeks post op; aware still hospitalized; outpt followup still planned  -Disposition unclear.  Medicaid pending. Per my discussion with patient and [name] (on phone) on 08/28/24 everyone was in agreement for going to SNF at discharge (originally she was trying to arrange for 24/7 care at home but this is very short term from what she's telling me on phone)  Acute urinary retention  - may be from deconditioned state as well - s/p foley; discontinued on 9/5 and was voiding okay for a few days - recurrent retention starting 9/9 and ultimately foley placed again on 9/10; had to be swapped on 9/11 after he developed acute pelvic pain of unclear etiology but resolved when foley was replaced with new - needs to have foley in place now for ~ 2 weeks  - outpatient urology arranged for 9/29 for repeat TOV  Acute respiratory failure with hypoxia-resolved Aspiration pneumonia-resolved Postoperatively the patient could not be weaned off of oxygen.   Chest x-ray suggested left lung infiltrate.  It is quite possible patient may have aspirated.  Recently done echocardiogram showed normal LVEF with grade 1 diastolic dysfunction. - Also treated with dose of Lasix  -S/p course of antibiotics -Now weaned off oxygen and remains on room air  Constipation - Continue as needed laxatives   Hyponatremia - resolved  Likely due to mild hypovolemia   Grade 1 diastolic dysfunction/history of mitral regurgitation Stable from a cardiac standpoint.  Echocardiogram shows LVEF of 50 to 55%.  Grade 1 diastolic dysfunction noted.  No significant valvular abnormalities. Volume status is stable.   Essential hypertension -Continue Toprol    Normocytic anemia Mild drop in hemoglobin is most likely dilutional.  Subsequently stable.  Checking labs periodically   Hyperlipidemia Continue statin   Seizure disorder Continue lacosamide    Chronic constipation Continue bowel regimen   Diabetes mellitus type 2 Noted to be on metformin  which is being continued.  SSI.  Monitor CBGs HbA1c is 6.9   Sleep apnea CPAP.  Requiring oxygen at low-dose   Class I obesity Estimated body mass index is 32.82 kg/m as calculated from the following   Height as of this encounter: 5' 9 (1.753 m)   Weight as of this encounter: 100.8 kg    Estimated body mass index is 32.82 kg/m as calculated from the following:   Height as of this encounter: 5' 9 (1.753 m).   Weight as of this encounter: 100.8 kg.  Interval History:  Had discomfort with the Foley catheter earlier yesterday afternoon almost describing bladder spasm.  No obvious issues were wrong with the catheter but it was replaced for a new one and pain resolved. Spoke on the phone with [name] in the room today next to patient and we had discussed disposition planning.  They now wished to pursue going back to rehab at discharge.  This was conveyed to treatment team.    Old records reviewed in assessment of this  patient  Antimicrobials:   DVT prophylaxis:  SCDs Start: 08/13/24 1850 SCDs Start: 08/11/24 1131   Code Status:   Code Status: Full Code  Mobility Assessment (Last 72 Hours)     Mobility Assessment     Row Name 08/28/24 1351 08/28/24 0830 08/27/24 2200 08/27/24 0800 08/26/24 2038   Does the patient have exclusion criteria? -- No - Perform mobility assessment No - Perform mobility assessment No - Perform mobility assessment No - Perform mobility assessment   What is the highest level of mobility based on the mobility assessment? Level 3 (Stands with assistance) - Balance while standing  and cannot march in place Level 3 (Stands with assistance) - Balance while standing  and cannot march in place Level 3 (Stands with assistance) - Balance while standing  and cannot march in place Level 3 (Stands with assistance) - Balance while standing  and cannot march in place Level 3 (Stands with assistance) - Balance while standing  and cannot march in place   Is the above level different from baseline mobility prior to current illness? -- Yes - Recommend PT order Yes - Recommend PT order Yes - Recommend PT order Yes - Recommend PT order    Row Name 08/26/24 1738 08/26/24 1252 08/25/24 1915 08/25/24 1702     Does the patient have exclusion criteria? -- No - Perform mobility assessment No - Perform mobility assessment --    What is the highest level of mobility based on the mobility assessment? Level 3 (Stands with assistance) - Balance while standing  and cannot march in place Level 4 (Ambulates with assistance) - Balance while stepping forward/back - Complete Level 3 (Stands with assistance) - Balance while standing  and cannot march in place Level 3 (Stands with assistance) - Balance while standing  and cannot march in place    Is the above level different from baseline mobility prior to current illness? -- -- Yes - Recommend PT order --       Barriers to discharge: Unclear discharge  plans Disposition Plan: TBD HH orders placed: TBD Status is: Inpatient  Objective: Blood pressure 128/84, pulse 73, temperature 98.3 F (36.8 C), resp. rate 16, height 5' 9 (1.753 m), weight 100.8 kg, SpO2 94%.  Examination:  Physical Exam Constitutional:      General: He is not in acute distress.    Appearance: Normal appearance.  HENT:     Head: Normocephalic and atraumatic.     Mouth/Throat:     Mouth: Mucous membranes are moist.  Eyes:     Extraocular Movements: Extraocular movements intact.  Cardiovascular:     Rate and Rhythm: Normal rate and regular rhythm.  Pulmonary:     Effort: Pulmonary effort is normal. No respiratory distress.     Breath sounds: Normal breath sounds. No wheezing.  Abdominal:     General: Bowel sounds are normal. There is no distension.     Palpations: Abdomen is soft.     Tenderness: There is no abdominal tenderness.  Musculoskeletal:     Cervical back: Normal range of motion and neck supple.  Comments: Right foot noted in surgical boot  Skin:    General: Skin is warm and dry.  Neurological:     General: No focal deficit present.     Mental Status: He is alert.  Psychiatric:        Mood and Affect: Mood normal.        Behavior: Behavior normal.      Consultants:    Procedures:    Data Reviewed: Results for orders placed or performed during the hospital encounter of 08/11/24 (from the past 24 hours)  Glucose, capillary     Status: Abnormal   Collection Time: 08/27/24  4:54 PM  Result Value Ref Range   Glucose-Capillary 126 (H) 70 - 99 mg/dL  Glucose, capillary     Status: Abnormal   Collection Time: 08/27/24 10:17 PM  Result Value Ref Range   Glucose-Capillary 146 (H) 70 - 99 mg/dL  Glucose, capillary     Status: Abnormal   Collection Time: 08/28/24  7:21 AM  Result Value Ref Range   Glucose-Capillary 134 (H) 70 - 99 mg/dL  Glucose, capillary     Status: Abnormal   Collection Time: 08/28/24 11:21 AM  Result Value Ref  Range   Glucose-Capillary 115 (H) 70 - 99 mg/dL   *Note: Due to a large number of results and/or encounters for the requested time period, some results have not been displayed. A complete set of results can be found in Results Review.    I have reviewed pertinent nursing notes, vitals, labs, and images as necessary. I have ordered labwork to follow up on as indicated.  I have reviewed the last notes from staff over past 24 hours. I have discussed patient's care plan and test results with nursing staff, CM/SW, and other staff as appropriate.  Time spent: Greater than 50% of the 55 minute visit was spent in counseling/coordination of care for the patient as laid out in the A&P.   LOS: 17 days   Alm Apo, MD [name] 08/28/2024, 3:02 PM

## 2024-08-29 DIAGNOSIS — K5909 Other constipation: Secondary | ICD-10-CM

## 2024-08-29 LAB — GLUCOSE, CAPILLARY
Glucose-Capillary: 126 mg/dL — ABNORMAL HIGH (ref 70–99)
Glucose-Capillary: 119 mg/dL — ABNORMAL HIGH (ref 70–99)
Glucose-Capillary: 112 mg/dL — ABNORMAL HIGH (ref 70–99)
Glucose-Capillary: 121 mg/dL — ABNORMAL HIGH (ref 70–99)

## 2024-08-29 MED ORDER — MAGNESIUM CITRATE PO SOLN: 1.0000 | Freq: Once | ORAL | Status: DC

## 2024-08-29 MED ORDER — LACTULOSE 10 GM/15ML PO SOLN: 30.0000 g | Freq: Two times a day (BID) | ORAL | Status: DC | PRN

## 2024-08-29 NOTE — Plan of Care (Signed)
  Problem: Safety: Goal: Ability to remain free from injury will improve Outcome: Progressing   Problem: Pain Managment: Goal: General experience of comfort will improve and/or be controlled Outcome: Progressing   Problem: Elimination: Goal: Will not experience complications related to bowel motility Outcome: Progressing   Problem: Coping: Goal: Level of anxiety will decrease Outcome: Progressing

## 2024-08-29 NOTE — Progress Notes (Signed)
 Progress Note    [name]   FMW:[id number]  DOB: December 20, 1955  DOA: 08/11/2024     18 PCP: [name] Laymon PARAS, MD  Initial CC: fall  Hospital Course: Patient is a 68 year old male with PMH obesity, anxiety/depression, schizoaffective disorder, bowel obstruction, anemia, colon polyps, constipation, GERD, DM II, HLD, HTN, memory loss, seizure disorder, skin cancer. He presented after a fall and was found to have a fracture involving the right distal fibula and medial malleolus. He was evaluated by orthopedic surgery and underwent repair on 08/13/2024.   Assessment and plan  Right ankle fracture Patient seen by orthopedics and underwent surgery on 8/28.  Pain seems to be reasonably well-controlled.  PT OT eval. SNF is recommended for short-term rehab - discussed with orthopedic surgery; okay to continue splint on and leave sutures in for now - continue NWB to RLE for at least 6-8 weeks after discussion with ortho also - ortho follow up was to be 2 weeks post op; aware still hospitalized; outpt followup still planned  - Disposition/discharge planning clarified on 08/28/2024 after discussion with patient.  Also had [name] on the phone during conversation.  He is now willing and wanting to go to rehab at discharge which is being pursued.  He has an acceptance at [facility] and will discharge there on Monday  Acute urinary retention  - may be from deconditioned state as well - s/p foley; discontinued on 9/5 and was voiding okay for a few days - recurrent retention starting 9/9 and ultimately foley placed again on 9/10; had to be swapped on 9/11 after he developed acute pelvic pain of unclear etiology but resolved when foley was replaced with new - needs to have foley in place now for ~ 2 weeks  - outpatient urology arranged for 9/29 for repeat TOV  Acute respiratory failure with hypoxia-resolved Aspiration pneumonia-resolved Postoperatively the patient could not be weaned off of  oxygen.  Chest x-ray suggested left lung infiltrate.  It is quite possible patient may have aspirated.  Recently done echocardiogram showed normal LVEF with grade 1 diastolic dysfunction. - Also treated with dose of Lasix  -S/p course of antibiotics -Now weaned off oxygen and remains on room air  Constipation - Continue as needed laxatives   Hyponatremia - resolved  Likely due to mild hypovolemia   Grade 1 diastolic dysfunction/history of mitral regurgitation Stable from a cardiac standpoint.  Echocardiogram shows LVEF of 50 to 55%.  Grade 1 diastolic dysfunction noted.  No significant valvular abnormalities. Volume status is stable.   Essential hypertension -Continue Toprol    Normocytic anemia Mild drop in hemoglobin is most likely dilutional.  Subsequently stable.  Checking labs periodically   Hyperlipidemia Continue statin   Seizure disorder Continue lacosamide    Chronic constipation Continue bowel regimen   Diabetes mellitus type 2 Noted to be on metformin  which is being continued.  SSI.  Monitor CBGs HbA1c is 6.9   Sleep apnea CPAP.  Requiring oxygen at low-dose   Class I obesity Estimated body mass index is 32.82 kg/m as calculated from the following   Height as of this encounter: 5' 9 (1.753 m)   Weight as of this encounter: 100.8 kg    Estimated body mass index is 32.82 kg/m as calculated from the following:   Height as of this encounter: 5' 9 (1.753 m).   Weight as of this encounter: 100.8 kg.  Interval History:  No issues overnight.  Sitting on bedside commode this morning but still having some  constipation issues.  Regimen modified some today. He has bed acceptance offer at [facility] with plans for discharging there on Monday.   Old records reviewed in assessment of this patient  Antimicrobials:   DVT prophylaxis:  SCDs Start: 08/13/24 1850 SCDs Start: 08/11/24 1131   Code Status:   Code Status: Full Code  Mobility Assessment (Last 72  Hours)     Mobility Assessment     Row Name 08/29/24 0745 08/28/24 2109 08/28/24 1351 08/28/24 0830 08/27/24 2200   Does the patient have exclusion criteria? No - Perform mobility assessment No - Perform mobility assessment -- No - Perform mobility assessment No - Perform mobility assessment   What is the highest level of mobility based on the mobility assessment? Level 3 (Stands with assistance) - Balance while standing  and cannot march in place Level 3 (Stands with assistance) - Balance while standing  and cannot march in place Level 3 (Stands with assistance) - Balance while standing  and cannot march in place Level 3 (Stands with assistance) - Balance while standing  and cannot march in place Level 3 (Stands with assistance) - Balance while standing  and cannot march in place   Is the above level different from baseline mobility prior to current illness? Yes - Recommend PT order Yes - Recommend PT order -- Yes - Recommend PT order Yes - Recommend PT order    Row Name 08/27/24 0800 08/26/24 2038 08/26/24 1738       Does the patient have exclusion criteria? No - Perform mobility assessment No - Perform mobility assessment --     What is the highest level of mobility based on the mobility assessment? Level 3 (Stands with assistance) - Balance while standing  and cannot march in place Level 3 (Stands with assistance) - Balance while standing  and cannot march in place Level 3 (Stands with assistance) - Balance while standing  and cannot march in place     Is the above level different from baseline mobility prior to current illness? Yes - Recommend PT order Yes - Recommend PT order --        Barriers to discharge: Unclear discharge plans Disposition Plan: TBD HH orders placed: TBD Status is: Inpatient  Objective: Blood pressure 129/76, pulse 77, temperature 97.7 F (36.5 C), temperature source Oral, resp. rate 18, height 5' 9 (1.753 m), weight 100.8 kg, SpO2 94%.  Examination:  Physical  Exam Constitutional:      General: He is not in acute distress.    Appearance: Normal appearance.  HENT:     Head: Normocephalic and atraumatic.     Mouth/Throat:     Mouth: Mucous membranes are moist.  Eyes:     Extraocular Movements: Extraocular movements intact.  Cardiovascular:     Rate and Rhythm: Normal rate and regular rhythm.  Pulmonary:     Effort: Pulmonary effort is normal. No respiratory distress.     Breath sounds: Normal breath sounds. No wheezing.  Abdominal:     General: Bowel sounds are normal. There is no distension.     Palpations: Abdomen is soft.     Tenderness: There is no abdominal tenderness.  Musculoskeletal:     Cervical back: Normal range of motion and neck supple.     Comments: Right foot noted in surgical boot  Skin:    General: Skin is warm and dry.  Neurological:     General: No focal deficit present.     Mental Status: He is  alert.  Psychiatric:        Mood and Affect: Mood normal.        Behavior: Behavior normal.      Consultants:    Procedures:    Data Reviewed: Results for orders placed or performed during the hospital encounter of 08/11/24 (from the past 24 hours)  Glucose, capillary     Status: Abnormal   Collection Time: 08/28/24  9:20 PM  Result Value Ref Range   Glucose-Capillary 120 (H) 70 - 99 mg/dL  Glucose, capillary     Status: Abnormal   Collection Time: 08/29/24  7:33 AM  Result Value Ref Range   Glucose-Capillary 126 (H) 70 - 99 mg/dL  Glucose, capillary     Status: Abnormal   Collection Time: 08/29/24 12:48 PM  Result Value Ref Range   Glucose-Capillary 119 (H) 70 - 99 mg/dL  Glucose, capillary     Status: Abnormal   Collection Time: 08/29/24  5:06 PM  Result Value Ref Range   Glucose-Capillary 112 (H) 70 - 99 mg/dL   *Note: Due to a large number of results and/or encounters for the requested time period, some results have not been displayed. A complete set of results can be found in Results Review.    I  have reviewed pertinent nursing notes, vitals, labs, and images as necessary. I have ordered labwork to follow up on as indicated.  I have reviewed the last notes from staff over past 24 hours. I have discussed patient's care plan and test results with nursing staff, CM/SW, and other staff as appropriate.  Time spent: Greater than 50% of the 55 minute visit was spent in counseling/coordination of care for the patient as laid out in the A&P.   LOS: 18 days   Alm Apo, MD [name] 08/29/2024, 5:08 PM

## 2024-08-29 NOTE — Plan of Care (Signed)

## 2024-08-29 NOTE — Progress Notes (Signed)
 Mobility Specialist - Progress Note   08/29/24 1520  Mobility  Activity Pivoted/transferred from bed to chair  Level of Assistance Contact guard assist, steadying assist  Assistive Device Front wheel walker  Range of Motion/Exercises Active  RLE Weight Bearing Per Provider Order NWB  Activity Response Tolerated well  Mobility Referral Yes  Mobility visit 1 Mobility  Mobility Specialist Start Time (ACUTE ONLY) 1510  Mobility Specialist Stop Time (ACUTE ONLY) 1520  Mobility Specialist Time Calculation (min) (ACUTE ONLY) 10 min   Pt was found in bed and agreeable to mobilize. Transferred to recliner chair with all needs met. Call bell in reach and LPN notified.   Erminio Leos,  Mobility Specialist Can be reached via Secure Chat

## 2024-08-30 LAB — GLUCOSE, CAPILLARY
Glucose-Capillary: 116 mg/dL — ABNORMAL HIGH (ref 70–99)
Glucose-Capillary: 171 mg/dL — ABNORMAL HIGH (ref 70–99)
Glucose-Capillary: 148 mg/dL — ABNORMAL HIGH (ref 70–99)
Glucose-Capillary: 143 mg/dL — ABNORMAL HIGH (ref 70–99)

## 2024-08-30 NOTE — Progress Notes (Signed)
 Progress Note    [name]   FMW:[id number]  DOB: 02-Jan-1956  DOA: 08/11/2024     19 PCP: [name], MD  Initial CC: fall  Hospital Course: Patient is a 68 year old male with PMH obesity, anxiety/depression, schizoaffective disorder, bowel obstruction, anemia, colon polyps, constipation, GERD, DM II, HLD, HTN, memory loss, seizure disorder, skin cancer. He presented after a fall and was found to have a fracture involving the right distal fibula and medial malleolus. He was evaluated by orthopedic surgery and underwent repair on 08/13/2024.   Assessment and plan  Right ankle fracture Patient seen by orthopedics and underwent surgery on 8/28.  Pain seems to be reasonably well-controlled.  PT OT eval. SNF is recommended for short-term rehab - discussed with orthopedic surgery; okay to continue splint on and leave sutures in for now - continue NWB to RLE for at least 6-8 weeks after discussion with ortho also - ortho follow up was to be 2 weeks post op; aware still hospitalized; outpt followup still planned  - Disposition/discharge planning clarified on 08/28/2024 after discussion with patient.  Also had [name] on the phone during conversation.  He is now willing and wanting to go to rehab at discharge which is being pursued.  He has an acceptance at [facility] and will discharge there on Monday  Acute urinary retention  - may be from deconditioned state as well - s/p foley; discontinued on 9/5 and was voiding okay for a few days - recurrent retention starting 9/9 and ultimately foley placed again on 9/10; had to be swapped on 9/11 after he developed acute pelvic pain of unclear etiology but resolved when foley was replaced with new - needs to have foley in place now for ~ 2 weeks  - outpatient urology arranged for 9/29 for repeat TOV  Acute respiratory failure with hypoxia-resolved Aspiration pneumonia-resolved Postoperatively the patient could not be weaned off of  oxygen.  Chest x-ray suggested left lung infiltrate.  It is quite possible patient may have aspirated.  Recently done echocardiogram showed normal LVEF with grade 1 diastolic dysfunction. - Also treated with dose of Lasix  -S/p course of antibiotics -Now weaned off oxygen and remains on room air  Constipation - Continue as needed laxatives   Hyponatremia - resolved  Likely due to mild hypovolemia   Grade 1 diastolic dysfunction/history of mitral regurgitation Stable from a cardiac standpoint.  Echocardiogram shows LVEF of 50 to 55%.  Grade 1 diastolic dysfunction noted.  No significant valvular abnormalities. Volume status is stable.   Essential hypertension -Continue Toprol    Normocytic anemia Mild drop in hemoglobin is most likely dilutional.  Subsequently stable.  Checking labs periodically   Hyperlipidemia Continue statin   Seizure disorder Continue lacosamide    Chronic constipation Continue bowel regimen   Diabetes mellitus type 2 Noted to be on metformin  which is being continued.  SSI.  Monitor CBGs HbA1c is 6.9   Sleep apnea CPAP.  Requiring oxygen at low-dose   Class I obesity Estimated body mass index is 32.82 kg/m as calculated from the following   Height as of this encounter: 5' 9 (1.753 m)   Weight as of this encounter: 100.8 kg    Estimated body mass index is 32.82 kg/m as calculated from the following:   Height as of this encounter: 5' 9 (1.753 m).   Weight as of this encounter: 100.8 kg.  Interval History:  No issues overnight.  Ready for discharge on Monday he says.   Old  records reviewed in assessment of this patient  Antimicrobials:   DVT prophylaxis:  SCDs Start: 08/13/24 1850 SCDs Start: 08/11/24 1131   Code Status:   Code Status: Full Code  Mobility Assessment (Last 72 Hours)     Mobility Assessment     Row Name 08/30/24 0930 08/29/24 2000 08/29/24 0745 08/28/24 2109 08/28/24 1351   Does the patient have exclusion criteria?  No - Perform mobility assessment No - Perform mobility assessment No - Perform mobility assessment No - Perform mobility assessment --   What is the highest level of mobility based on the mobility assessment? Level 3 (Stands with assistance) - Balance while standing  and cannot march in place Level 3 (Stands with assistance) - Balance while standing  and cannot march in place Level 3 (Stands with assistance) - Balance while standing  and cannot march in place Level 3 (Stands with assistance) - Balance while standing  and cannot march in place Level 3 (Stands with assistance) - Balance while standing  and cannot march in place   Is the above level different from baseline mobility prior to current illness? Yes - Recommend PT order Yes - Recommend PT order Yes - Recommend PT order Yes - Recommend PT order --    Row Name 08/28/24 0830 08/27/24 2200         Does the patient have exclusion criteria? No - Perform mobility assessment No - Perform mobility assessment      What is the highest level of mobility based on the mobility assessment? Level 3 (Stands with assistance) - Balance while standing  and cannot march in place Level 3 (Stands with assistance) - Balance while standing  and cannot march in place      Is the above level different from baseline mobility prior to current illness? Yes - Recommend PT order Yes - Recommend PT order         Barriers to discharge:  Disposition Plan: [facility] HH orders placed: TBD Status is: Inpatient  Objective: Blood pressure 130/80, pulse 70, temperature 98.2 F (36.8 C), temperature source Oral, resp. rate 17, height 5' 9 (1.753 m), weight 100.8 kg, SpO2 96%.  Examination:  Physical Exam Constitutional:      General: He is not in acute distress.    Appearance: Normal appearance.  HENT:     Head: Normocephalic and atraumatic.     Mouth/Throat:     Mouth: Mucous membranes are moist.  Eyes:     Extraocular Movements: Extraocular movements intact.   Cardiovascular:     Rate and Rhythm: Normal rate and regular rhythm.  Pulmonary:     Effort: Pulmonary effort is normal. No respiratory distress.     Breath sounds: Normal breath sounds. No wheezing.  Abdominal:     General: Bowel sounds are normal. There is no distension.     Palpations: Abdomen is soft.     Tenderness: There is no abdominal tenderness.  Musculoskeletal:     Cervical back: Normal range of motion and neck supple.     Comments: Right foot noted in surgical boot  Skin:    General: Skin is warm and dry.  Neurological:     General: No focal deficit present.     Mental Status: He is alert.  Psychiatric:        Mood and Affect: Mood normal.        Behavior: Behavior normal.      Consultants:  Orthopedic surgery  Procedures:  08/13/2024: ORIF right  ankle fracture  Data Reviewed: Results for orders placed or performed during the hospital encounter of 08/11/24 (from the past 24 hours)  Glucose, capillary     Status: Abnormal   Collection Time: 08/29/24  5:06 PM  Result Value Ref Range   Glucose-Capillary 112 (H) 70 - 99 mg/dL  Glucose, capillary     Status: Abnormal   Collection Time: 08/29/24 10:33 PM  Result Value Ref Range   Glucose-Capillary 121 (H) 70 - 99 mg/dL  Glucose, capillary     Status: Abnormal   Collection Time: 08/30/24  8:23 AM  Result Value Ref Range   Glucose-Capillary 116 (H) 70 - 99 mg/dL  Glucose, capillary     Status: Abnormal   Collection Time: 08/30/24 11:57 AM  Result Value Ref Range   Glucose-Capillary 171 (H) 70 - 99 mg/dL   *Note: Due to a large number of results and/or encounters for the requested time period, some results have not been displayed. A complete set of results can be found in Results Review.    I have reviewed pertinent nursing notes, vitals, labs, and images as necessary. I have ordered labwork to follow up on as indicated.  I have reviewed the last notes from staff over past 24 hours. I have discussed patient's  care plan and test results with nursing staff, CM/SW, and other staff as appropriate.    LOS: 19 days   Alm Apo, MD [name] 08/30/2024, 2:48 PM

## 2024-08-30 NOTE — Plan of Care (Signed)
   Problem: Skin Integrity: Goal: Risk for impaired skin integrity will decrease Outcome: Progressing

## 2024-08-31 LAB — GLUCOSE, CAPILLARY
Glucose-Capillary: 131 mg/dL — ABNORMAL HIGH (ref 70–99)
Glucose-Capillary: 121 mg/dL — ABNORMAL HIGH (ref 70–99)
Glucose-Capillary: 122 mg/dL — ABNORMAL HIGH (ref 70–99)

## 2024-08-31 MED ORDER — LACOSAMIDE 150 MG PO TABS: 1.0000 | ORAL_TABLET | Freq: Two times a day (BID) | ORAL | 1 refills | Status: AC

## 2024-08-31 NOTE — Progress Notes (Signed)
 Occupational Therapy Treatment Patient Details Name: [name] MRN: [id number] DOB: [birth year] Today's Date: 08/31/2024   History of present illness [name] is a 68 y.o. male admitted 08/11/24 with dx R ankle fx with medical history significant of anxiety, depression, schizoaffective disorder, bowel obstruction, chronic anemia, colon polyps, constipation, GERD, type 2 diabetes, hyperlipidemia, hypertension, memory loss, MRSA carrier, history of pneumonia, seizure disorder, skin cancer who was brought to the emergency department after he had a fall.   OT comments  Pt making continued progress towards OT goals with improving compliance to RLE NWB status. Emphasis on wheelchair maneuvering in small spaces, ADL positioning/considerations from a wheelchair level and energy conservation given increased WOB with minimal tasks. Pt able to manage transfer from wheelchair with CGA though will benefit from continued training with postacute rehab stay to decrease overall fall risk.      If plan is discharge home, recommend the following:  A little help with walking and/or transfers;A lot of help with bathing/dressing/bathroom;Assistance with Char[employer] fundraiser cushion (measurements OT);Wheelchair (measurements OT);Other (comment) (RW)    Recommendations for Other Services      Precautions / Restrictions Precautions Precautions: Fall Restrictions Weight Bearing Restrictions Per Provider Order: Yes RLE Weight Bearing Per Provider Order: Non weight bearing       Mobility Bed Mobility Overal bed mobility: Needs Assistance Bed Mobility: Sit to Supine       Sit to supine: Supervision        Transfers Overall transfer level: Needs assistance Equipment used: None Transfers: Bed to chair/wheelchair/BSC     Squat pivot transfers: Contact guard assist       General transfer comment: CGA to squat pivot back to bed from wheelchair. assist to  manage w/c footrest, cues to minimize gap between surfaces to decrease fall risk and cues for sequencing (holding to bedrail once up to assist)     Balance Overall balance assessment: History of Falls, Needs assistance Sitting-balance support: Feet supported, Single extremity supported Sitting balance-Leahy Scale: Good     Standing balance support: Bilateral upper extremity supported, During functional activity, Reliant on assistive device for balance Standing balance-Leahy Scale: Poor                             ADL either performed or assessed with clinical judgement   ADL Overall ADL's : Needs assistance/impaired     Grooming: Set up;Sitting;Oral care Grooming Details (indicate cue type and reason): seated at sink, cues for positioning wheelchair at sink properly                               General ADL Comments: Emphasis on w/c mgmt in/out of bathroom (navigating in small spaces), mgmt of basic ADLs and considerations for accessing bathroom at home    Extremity/Trunk Assessment Upper Extremity Assessment Upper Extremity Assessment: Right hand dominant;LUE deficits/detail LUE Deficits / Details: chronic shoulder AROM limitations. Functional grip strength   Lower Extremity Assessment Lower Extremity Assessment: Defer to PT evaluation        Vision   Vision Assessment?: No apparent visual deficits   Perception     Praxis     Communication Communication Communication: No apparent difficulties   Cognition Arousal: Alert Behavior During Therapy: WFL for tasks assessed/performed Cognition: Cognition impaired     Awareness: Online awareness impaired Memory impairment (select all impairments): Working  memory, Short-term memory Attention impairment (select first level of impairment): Selective attention Executive functioning impairment (select all impairments): Reasoning OT - Cognition Comments: cues for sequencing, problem solving completion  of tasks with DME and NWB precautions. pleasant. memory deficits as pt had forgotten what he told OT that he wanted to order for lunch when discussing the topic and assisting with calling the cafeteria                 Following commands: Impaired Following commands impaired: Only follows one step commands consistently, Follows one step commands with increased time, Follows multi-step commands inconsistently      Cueing   Cueing Techniques: Verbal cues, Gestural cues  Exercises      Shoulder Instructions       General Comments      Pertinent Vitals/ Pain       Pain Assessment Pain Assessment: No/denies pain  Home Living                                          Prior Functioning/Environment              Frequency  Min 2X/week        Progress Toward Goals  OT Goals(current goals can now be found in the care plan section)  Progress towards OT goals: Progressing toward goals  Acute Rehab OT Goals Patient Stated Goal: be able to use walker to get into bathroom at home, be able to put weight on foot OT Goal Formulation: With patient Time For Goal Achievement: 09/11/24 Potential to Achieve Goals: Good ADL Goals Pt Will Perform Lower Body Dressing: with contact guard assist;sitting/lateral leans;with adaptive equipment Pt Will Transfer to Toilet: with contact guard assist;squat pivot transfer;stand pivot transfer;bedside commode Pt Will Perform Toileting - Clothing Manipulation and hygiene: with contact guard assist;sitting/lateral leans  Plan      Co-evaluation                 AM-PAC OT 6 Clicks Daily Activity     Outcome Measure   Help from another person eating meals?: None Help from another person taking care of personal grooming?: A Little Help from another person toileting, which includes using toliet, bedpan, or urinal?: A Lot Help from another person bathing (including washing, rinsing, drying)?: A Lot Help from another  person to put on and taking off regular upper body clothing?: A Little Help from another person to put on and taking off regular lower body clothing?: A Lot 6 Click Score: 16    End of Session Equipment Utilized During Treatment: Gait belt;Other (comment) (wheelchair)  OT Visit Diagnosis: Other abnormalities of gait and mobility (R26.89);History of falling (Z91.81);Muscle weakness (generalized) (M62.81);Other symptoms and signs involving cognitive function Pain - Right/Left: Right Pain - part of body: Leg   Activity Tolerance Patient tolerated treatment well;Patient limited by fatigue   Patient Left in bed;with call bell/phone within reach;with bed alarm set   Nurse Communication          Time: 8890-8864 OT Time Calculation (min): 26 min  Charges: OT General Charges $OT Visit: 1 Visit OT Treatments $Self Care/Home Management : 8-22 mins  Mliss NOVAK, OTR/L Acute Rehab Services Office: [phone]   Mliss Fish 08/31/2024, 12:00 PM

## 2024-08-31 NOTE — Progress Notes (Addendum)
 Physical Therapy Treatment Patient Details Name: [name] MRN: [id number] DOB: [birth year] Today's Date: 08/31/2024   History of Present Illness [name] is a 68 y.o. male admitted 08/11/24 with dx R ankle fx with medical history significant of anxiety, depression, schizoaffective disorder, bowel obstruction, chronic anemia, colon polyps, constipation, GERD, type 2 diabetes, hyperlipidemia, hypertension, memory loss, MRSA carrier, history of pneumonia, seizure disorder, skin cancer who was brought to the emergency department after he had a fall.    PT Comments  The patient participated in  mobility and transfers to [facility], performed WC  mobility, noted 3/4 dyspnea and required  several rest breaks.  Patient requires frequent cues  for safety/ NWB  RLE.  Patient will benefit from continued inpatient follow up therapy, <3 hours/day     If plan is discharge home, recommend the following: Two people to help with walking and/or transfers;A lot of help with bathing/dressing/bathroom;Assistance with cooking/housework;Assist for transportation;Help with stairs or ramp for entrance   Can travel by private vehicle        Equipment Recommendations  Other (comment)    Recommendations for Other Services       Precautions / Restrictions Precautions Precautions: Fall Recall of Precautions/Restrictions: Impaired Precaution/Restrictions Comments: pt continues to require cues for R LE NWB Restrictions RLE Weight Bearing Per Provider Order: Weight bearing as tolerated     Mobility  Bed Mobility   Bed Mobility: Supine to Sit     Supine to sit: Supervision     General bed mobility comments: Pt self able with use of rail and momentum    Transfers Overall transfer level: Needs assistance Equipment used: None Transfers: Bed to chair/wheelchair/BSC Sit to Stand: Contact guard assist, From elevated surface     Squat pivot transfers: Contact guard assist     General transfer comment:  CGA to squat pivot from bed to WC, cues to minimize gap between surfaces to decrease fall risk and cues for sequencing    Ambulation/Gait                   Psychologist, counselling mobility: Yes Wheelchair propulsion: Both upper extremities, Left lower extremity Wheelchair parts: Needs assistance Distance: 200 Wheelchair Assistance Details (indicate cue type and reason): required multiple rest breaks, noted 3/4 dyspnea   Tilt Bed    Modified Rankin (Stroke Patients Only)       Balance Overall balance assessment: History of Falls, Needs assistance Sitting-balance support: Feet supported, Single extremity supported Sitting balance-Leahy Scale: Good     Standing balance support: Bilateral upper extremity supported, During functional activity, Reliant on assistive device for balance Standing balance-Leahy Scale: Poor                              Communication Communication Communication: No apparent difficulties  Cognition Arousal: Alert Behavior During Therapy: WFL for tasks assessed/performed   PT - Cognitive impairments: Sequencing, Problem solving, Safety/Judgement                       PT - Cognition Comments: appears AxO x 3 but presents with impaired safety awareness and requiring repeat VC's and Instructions. Following commands: Impaired Following commands impaired: Follows one step commands with increased time, Follows multi-step commands inconsistently    Cueing Cueing Techniques: Verbal cues, Gestural cues  Exercises  General Comments        Pertinent Vitals/Pain Pain Assessment Pain Assessment: No/denies pain    Home Living                          Prior Function            PT Goals (current goals can now be found in the care plan section) Progress towards PT goals: Progressing toward goals    Frequency    Min 3X/week      PT Plan       Co-evaluation   Reason for Co-Treatment: Complexity of the patient's impairments (multi-system involvement);For patient/therapist safety PT goals addressed during session: Mobility/safety with mobility;Balance;Proper use of DME OT goals addressed during session: ADL's and self-care      AM-PAC PT 6 Clicks Mobility   Outcome Measure  Help needed turning from your back to your side while in a flat bed without using bedrails?: None Help needed moving from lying on your back to sitting on the side of a flat bed without using bedrails?: None Help needed moving to and from a bed to a chair (including a wheelchair)?: A Little Help needed standing up from a chair using your arms (e.g., wheelchair or bedside chair)?: A Little Help needed to walk in hospital room?: Total Help needed climbing 3-5 steps with a railing? : Total 6 Click Score: 16    End of Session Equipment Utilized During Treatment: Gait belt Activity Tolerance: Patient tolerated treatment well Patient left: in chair;with call bell/phone within reach (with OT) Nurse Communication: Mobility status PT Visit Diagnosis: Unsteadiness on feet (R26.81);Other abnormalities of gait and mobility (R26.89);Muscle weakness (generalized) (M62.81);History of falling (Z91.81);Difficulty in walking, not elsewhere classified (R26.2)     Time: 8943-8884 PT Time Calculation (min) (ACUTE ONLY): 19 min  Charges:    $Therapeutic Activity: 8-22 mins PT General Charges $$ ACUTE PT VISIT: 1 Visit                     Darice Potters PT Acute Rehabilitation Services Office [phone]    Potters Darice Norris 08/31/2024, 12:32 PM

## 2024-08-31 NOTE — Discharge Summary (Signed)
 Physician Discharge Summary   [name] FMW:[id number] DOB: 1956-11-28 DOA: 08/11/2024  PCP: [name] Laymon PARAS, MD  Admit date: 08/11/2024 Discharge date: 08/31/2024   Admitted From: Home Disposition:  [facility] Discharging physician: Alm Apo, MD Barriers to discharge: none  Recommendations at discharge: Follow up with orthopedic surgery Continue NWB to RLE  Follow up with urology on 9/29 for voiding trial; continue foley until then  Discharge Condition: stable CODE STATUS: Full  Diet recommendation:  Diet Orders (From admission, onward)     Start     Ordered   08/31/24 0000  Diet - low sodium heart healthy        08/31/24 1052   08/13/24 1850  Diet Heart Room service appropriate? Yes; Fluid consistency: Thin  Diet effective now       Question Answer Comment  Room service appropriate? Yes   Fluid consistency: Thin      08/13/24 1849            Hospital Course: Patient is a 68 year old male with PMH obesity, anxiety/depression, schizoaffective disorder, bowel obstruction, anemia, colon polyps, constipation, GERD, DM II, HLD, HTN, memory loss, seizure disorder, skin cancer. He presented after a fall and was found to have a fracture involving the right distal fibula and medial malleolus. He was evaluated by orthopedic surgery and underwent repair on 08/13/2024.   Assessment and plan  Right ankle fracture Patient seen by orthopedics and underwent surgery on 8/28.  Pain seems to be reasonably well-controlled.  PT OT eval. SNF is recommended for short-term rehab - discussed with orthopedic surgery; okay to continue splint on and leave sutures in for now - continue NWB to RLE for at least 6-8 weeks after discussion with ortho also - ortho follow up was to be 2 weeks post op; aware still hospitalized; outpt followup still planned  - Disposition/discharge planning clarified on 08/28/2024 after discussion with patient.  Also had [name] on the phone during  conversation.  He is now willing and wanting to go to rehab at discharge which is being pursued - d/c to [facility]   Acute urinary retention  - may be from deconditioned state as well - s/p foley; discontinued on 9/5 and was voiding okay for a few days - recurrent retention starting 9/9 and ultimately foley placed again on 9/10; had to be swapped on 9/11 after he developed acute pelvic pain of unclear etiology but resolved when foley was replaced with new - needs to have foley in place now for ~ 2 weeks  - outpatient urology arranged for 9/29 for repeat TOV  Acute respiratory failure with hypoxia-resolved Aspiration pneumonia-resolved Postoperatively the patient could not be weaned off of oxygen.  Chest x-ray suggested left lung infiltrate.  It is quite possible patient may have aspirated.  Recently done echocardiogram showed normal LVEF with grade 1 diastolic dysfunction. - Also treated with dose of Lasix  -S/p course of antibiotics -Now weaned off oxygen and remains on room air  Constipation - Continue as needed laxatives   Hyponatremia - resolved  Likely due to mild hypovolemia   Grade 1 diastolic dysfunction/history of mitral regurgitation Stable from a cardiac standpoint.  Echocardiogram shows LVEF of 50 to 55%.  Grade 1 diastolic dysfunction noted.  No significant valvular abnormalities. Volume status is stable.   Essential hypertension -Continue Toprol    Normocytic anemia Mild drop in hemoglobin is most likely dilutional.  Subsequently stable.  Checking labs periodically   Hyperlipidemia Continue statin   Seizure disorder  Continue lacosamide    Chronic constipation Continue bowel regimen   Diabetes mellitus type 2 Noted to be on metformin  which is being continued.  SSI.  Monitor CBGs HbA1c is 6.9   Sleep apnea CPAP.  Requiring oxygen at low-dose   Class I obesity Estimated body mass index is 32.82 kg/m as calculated from the following   Height as of this  encounter: 5' 9 (1.753 m)   Weight as of this encounter: 100.8 kg    Estimated body mass index is 32.82 kg/m as calculated from the following:   Height as of this encounter: 5' 9 (1.753 m).   Weight as of this encounter: 100.8 kg.    Principal Diagnosis: Closed right ankle fracture  Discharge Diagnoses: Active Hospital Problems   Diagnosis Date Noted   Closed right ankle fracture 08/11/2024    Priority: 1.   Closed fracture of shaft of right fibula 08/19/2024    Priority: 1.   Acute urinary retention 08/27/2024    Priority: 2.   Displaced fracture of medial malleolus of right tibia, initial encounter for closed fracture 08/21/2024   Class 1 obesity 08/11/2024   Grade I diastolic dysfunction 08/11/2024   Hyponatremia 08/11/2024   DM (diabetes mellitus), type 2 (HCC) 06/27/2021   Mild sleep apnea 06/15/2016   Essential hypertension    Constipation, chronic 09/07/2011   Hyperlipidemia 01/28/2008   Seizure disorder (HCC) 01/28/2008    Resolved Hospital Problems  No resolved problems to display.     Discharge Instructions     Diet - low sodium heart healthy   Complete by: As directed    Discharge wound care:   Complete by: As directed    Continue surgical boot until follow up   Increase activity slowly   Complete by: As directed       Allergies as of 08/31/2024       Reactions   Codeine Itching, Nausea Only   Sulfa Antibiotics Nausea And Vomiting, Other (See Comments)   Flu-like symptoms   Serotonin Reuptake Inhibitors (ssris) Other (See Comments)   Restlessness        Medication List     STOP taking these medications    QUEtiapine  100 MG tablet Commonly known as: SEROQUEL        TAKE these medications    acetaminophen  325 MG tablet Commonly known as: TYLENOL  Take 2 tablets (650 mg total) by mouth every 4 (four) hours as needed for mild pain (pain score 1-3), fever, moderate pain (pain score 4-6) or headache (or Fever >/= 101).    amitriptyline  100 MG tablet Commonly known as: ELAVIL  Take 2 tablets (200 mg total) by mouth at bedtime.   aspirin  EC 325 MG tablet Take 1 tablet (325 mg total) by mouth daily.   atorvastatin  40 MG tablet Commonly known as: LIPITOR Take 1 tablet (40 mg total) by mouth daily. What changed: when to take this   Lacosamide  150 MG Tabs Commonly known as: Vimpat  Take 1 tablet (150 mg total) by mouth 2 (two) times daily. What changed: when to take this   metFORMIN  500 MG 24 hr tablet Commonly known as: GLUCOPHAGE -XR Take 2 tablets (1,000 mg total) by mouth daily. What changed: when to take this   metoprolol  succinate 50 MG 24 hr tablet Commonly known as: TOPROL -XL Take 1 tablet (50 mg total) by mouth daily. Take with or immediately following a meal. What changed:  medication strength how much to take   tamsulosin  0.4 MG Caps capsule Commonly known  as: FLOMAX  Take 1 capsule (0.4 mg total) by mouth daily.               Durable Medical Equipment  (From admission, onward)           Start     Ordered   08/28/24 0928  For home use only DME standard manual wheelchair with seat cushion  Once       Comments: Patient suffers from ankle fracture and is non weight bearing to right lower extremity which impairs their ability to perform daily activities like dressing, grooming, and toileting in the home.  A cane or crutch will not resolve issue with performing activities of daily living. A wheelchair will allow patient to safely perform daily activities. Patient can safely propel the wheelchair in the home or has a caregiver who can provide assistance. Length of need 6 months . Accessories: elevating leg rests (ELRs), wheel locks, extensions and anti-tippers.   08/28/24 0929   08/28/24 0928  For home use only DME Walker rolling  Once       Question Answer Comment  Walker: With 5 Inch Wheels   Patient needs a walker to treat with the following condition Ankle fracture   Patient  needs a walker to treat with the following condition Generalized muscle weakness      08/28/24 0929   08/28/24 0928  For home use only DME 3 n 1  Once        08/28/24 0929              Discharge Care Instructions  (From admission, onward)           Start     Ordered   08/31/24 0000  Discharge wound care:       Comments: Continue surgical boot until follow up   08/31/24 1052            Contact information for follow-up providers     Elsa Lonni SAUNDERS, MD Follow up in 2 week(s).   Specialty: Orthopedic Surgery Contact information: [address] [place] [address] [phone]         [facility], [facility] Follow up.   Specialty: Home Health Services Why: Home Health Physical Therapy Contact information: [address] [place] [address] [phone]         Claudene Waddell HERO, PA-C. Go on 09/14/2024.   Why: Urology appt at 8:30 am Contact information: [address] [place] [place] [address] [phone]              Contact information for after-discharge care     Destination     [facility] .   Contact information: [address] [place]  [address] [phone]                    Allergies  Allergen Reactions   Codeine Itching and Nausea Only   Sulfa Antibiotics Nausea And Vomiting and Other (See Comments)    Flu-like symptoms   Serotonin Reuptake Inhibitors (Ssris) Other (See Comments)    Restlessness    Consultants:  Orthopedic surgery   Procedures:  08/13/2024: ORIF right ankle fracture  Discharge Exam: BP 136/77 (BP Location: Left Arm)   Pulse 73   Temp 97.8 F (36.6 C) (Oral)   Resp 16   Ht 5' 9 (1.753 m)   Wt 100.8 kg   SpO2 95%   BMI 32.82 kg/m  Physical Exam  Constitutional:      General: He is not in acute distress.    Appearance: Normal appearance.  HENT:     Head: Normocephalic and atraumatic.     Mouth/Throat:     Mouth:  Mucous membranes are moist.  Eyes:     Extraocular Movements: Extraocular movements intact.  Cardiovascular:     Rate and Rhythm: Normal rate and regular rhythm.  Pulmonary:     Effort: Pulmonary effort is normal. No respiratory distress.     Breath sounds: Normal breath sounds. No wheezing.  Abdominal:     General: Bowel sounds are normal. There is no distension.     Palpations: Abdomen is soft.     Tenderness: There is no abdominal tenderness.  Musculoskeletal:     Cervical back: Normal range of motion and neck supple.     Comments: Right foot noted in surgical boot  Skin:    General: Skin is warm and dry.  Neurological:     General: No focal deficit present.     Mental Status: He is alert.  Psychiatric:        Mood and Affect: Mood normal.        Behavior: Behavior normal.      The results of significant diagnostics from this hospitalization (including imaging, microbiology, ancillary and laboratory) are listed below for reference.   Microbiology: No results found for this or any previous visit (from the past 240 hours).   Labs: BNP (last 3 results) Recent Labs    07/18/24 1419  BNP 25.7   Basic Metabolic Panel: No results for input(s): NA, K, CL, CO2, GLUCOSE, BUN, CREATININE, CALCIUM , MG, PHOS in the last 168 hours. Liver Function Tests: No results for input(s): AST, ALT, ALKPHOS, BILITOT, PROT, ALBUMIN  in the last 168 hours. No results for input(s): LIPASE, AMYLASE in the last 168 hours. No results for input(s): AMMONIA in the last 168 hours. CBC: No results for input(s): WBC, NEUTROABS, HGB, HCT, MCV, PLT in the last 168 hours. Cardiac Enzymes: No results for input(s): CKTOTAL, CKMB, CKMBINDEX, TROPONINI in the last 168 hours. BNP: Invalid input(s): POCBNP CBG: Recent Labs  Lab 08/30/24 0823 08/30/24 1157 08/30/24 1647 08/30/24 2159 08/31/24 0718  GLUCAP 116* 171* 148* 143* 131*   D-Dimer No  results for input(s): DDIMER in the last 72 hours. Hgb A1c No results for input(s): HGBA1C in the last 72 hours. Lipid Profile No results for input(s): CHOL, HDL, LDLCALC, TRIG, CHOLHDL, LDLDIRECT in the last 72 hours. Thyroid function studies No results for input(s): TSH, T4TOTAL, T3FREE, THYROIDAB in the last 72 hours.  Invalid input(s): FREET3 Anemia work up No results for input(s): VITAMINB12, FOLATE, FERRITIN, TIBC, IRON, RETICCTPCT in the last 72 hours. Urinalysis    Component Value Date/Time   COLORURINE YELLOW 08/25/2024 1251   APPEARANCEUR CLEAR 08/25/2024 1251   LABSPEC 1.005 08/25/2024 1251   PHURINE 5.0 08/25/2024 1251   GLUCOSEU NEGATIVE 08/25/2024 1251   HGBUR SMALL (A) 08/25/2024 1251   HGBUR negative 07/18/2010 1436   BILIRUBINUR NEGATIVE 08/25/2024 1251   BILIRUBINUR NEG 01/17/2016 1446   KETONESUR NEGATIVE 08/25/2024 1251   PROTEINUR NEGATIVE 08/25/2024 1251   UROBILINOGEN 0.2 01/17/2016 1446   UROBILINOGEN 0.2 07/29/2015 1708   NITRITE NEGATIVE 08/25/2024 1251   LEUKOCYTESUR NEGATIVE 08/25/2024 1251   Sepsis Labs No results for input(s): WBC in the last 168 hours.  Invalid input(s): PROCALCITONIN, LACTICIDVEN Microbiology No results found for this or any previous visit (from the past 240 hours).  Procedures/Studies: DG CHEST PORT 1 VIEW Result Date: 08/15/2024 CLINICAL DATA:  Hemoptysis.  Dysphagia. EXAM: PORTABLE CHEST 1 VIEW COMPARISON:  08/14/2024 FINDINGS: Normal-sized heart. Clear lungs with normal vascularity. Stable minimal chronic interstitial prominence. A poor inspiration is again demonstrated. Stable left shoulder prosthesis. IMPRESSION: No acute abnormality. Electronically Signed   By: Elspeth Bathe M.D.   On: 08/15/2024 10:22   DG CHEST PORT 1 VIEW Result Date: 08/14/2024 EXAM: 1 VIEW XRAY OF THE CHEST 08/14/2024 04:53:00 AM COMPARISON: 08/13/2024 CLINICAL HISTORY: [id number] Pneumonia [id number]. pneumonia  FINDINGS: LUNGS AND PLEURA: Asymmetric increased interstitial opacities throughout the left upper lobe are again noted and is stable to improve in the interval. Lung volumes are very low. No pleural effusion. No pneumothorax. HEART AND MEDIASTINUM: No acute abnormality of the cardiac and mediastinal silhouettes. Asymmetric elevation of the right hemidiaphragm. BONES AND SOFT TISSUES: Previous left shoulder arthroplasty. No acute osseous abnormality. IMPRESSION: 1. Stable to improved asymmetric increased interstitial opacities throughout the left upper lobe, consistent with pneumonia. 2. Unchanged asymmetric elevation of the right hemidiaphragm. Electronically signed by: Waddell Calk MD 08/14/2024 08:50 AM EDT RP Workstation: [id number]   DG Chest Port 1 View Result Date: 08/13/2024 CLINICAL DATA:  Shortness of breath. EXAM: PORTABLE CHEST 1 VIEW COMPARISON:  Chest radiograph dated 08/11/2024. FINDINGS: Diffuse interstitial densities in the left lung, new since the prior radiograph and concerning for atypical infiltrate. Clinical correlation and follow-up recommended. No pleural effusion or pneumothorax. The cardiac silhouette is within normal limits. Partially visualized left shoulder arthroplasty. No acute osseous pathology. IMPRESSION: Findings concerning for atypical infiltrate in the left lung. Electronically Signed   By: Vanetta Chou M.D.   On: 08/13/2024 18:32   DG Ankle 2 Views Right Result Date: 08/13/2024 CLINICAL DATA:  Internal fixation of the right ankle. EXAM: RIGHT ANKLE - 2 VIEW COMPARISON:  Right ankle radiograph dated 08/11/2024. FINDINGS: Four intraoperative fluoroscopic spot images provided. The total fluoroscopic time is 15 seconds with a cumulative air Karma of 0.4362 mGy. Internal fixation of the distal fibula and medial malleolar fracture. IMPRESSION: Intraoperative fluoroscopic spot images of the right ankle. Electronically Signed   By: Vanetta Chou M.D.   On: 08/13/2024 16:15    DG C-Arm 1-60 Min-No Report Result Date: 08/13/2024 Fluoroscopy was utilized by the requesting physician.  No radiographic interpretation.   ECHOCARDIOGRAM COMPLETE Result Date: 08/12/2024    ECHOCARDIOGRAM REPORT   Patient Name:   JAAZIAH SCHULKE Date of Exam: 08/12/2024 Medical Rec #:  [id number]    Height:       69.0 in Accession #:    [id number]   Weight:       220.0 lb Date of Birth:  1956/07/26    BSA:          2.151 m Patient Age:    68 years     BP:           116/59 mmHg Patient Gender: M            HR:           64 bpm. Exam Location:  Inpatient Procedure: 2D Echo, Cardiac Doppler and Color Doppler (Both Spectral and Color            Flow Doppler were utilized during procedure). Indications:    Near syncope  History:        Patient has no prior history of Echocardiogram examinations.  Risk Factors:Hypertension and Diabetes.  Sonographer:    Jayson Gaskins Referring Phys: [id number] Cillian Gwinner MANUEL ORTIZ IMPRESSIONS  1. Left ventricular ejection fraction, by estimation, is 50%. The left ventricle has low normal function. Left ventricular endocardial border not optimally defined to evaluate regional wall motion. Consider repeat study with contrast. Left ventricular diastolic parameters are consistent with Grade I diastolic dysfunction (impaired relaxation).  2. Right ventricular systolic function is low normal. The right ventricular size is normal.  3. The mitral valve is normal in structure. Trivial mitral valve regurgitation. No evidence of mitral stenosis.  4. The aortic valve is normal in structure. Aortic valve regurgitation is not visualized. No aortic stenosis is present.  5. The inferior vena cava is normal in size with greater than 50% respiratory variability, suggesting right atrial pressure of 3 mmHg. FINDINGS  Left Ventricle: Left ventricular ejection fraction, by estimation, is 50 to 55%. The left ventricle has low normal function. Left ventricular endocardial border not optimally  defined to evaluate regional wall motion. The left ventricular internal cavity  size was normal in size. There is no left ventricular hypertrophy. Left ventricular diastolic parameters are consistent with Grade I diastolic dysfunction (impaired relaxation). Right Ventricle: The right ventricular size is normal. No increase in right ventricular wall thickness. Right ventricular systolic function is low normal. Left Atrium: Left atrial size was normal in size. Right Atrium: Right atrial size was normal in size. Pericardium: There is no evidence of pericardial effusion. Mitral Valve: The mitral valve is normal in structure. Trivial mitral valve regurgitation. No evidence of mitral valve stenosis. Tricuspid Valve: The tricuspid valve is normal in structure. Tricuspid valve regurgitation is not demonstrated. No evidence of tricuspid stenosis. Aortic Valve: The aortic valve is normal in structure. Aortic valve regurgitation is not visualized. No aortic stenosis is present. Aortic valve mean gradient measures 2.0 mmHg. Aortic valve peak gradient measures 3.7 mmHg. Aortic valve area, by VTI measures 2.30 cm. Pulmonic Valve: The pulmonic valve was grossly normal. Pulmonic valve regurgitation is not visualized. No evidence of pulmonic stenosis. Aorta: The aortic root is normal in size and structure. Venous: The inferior vena cava is normal in size with greater than 50% respiratory variability, suggesting right atrial pressure of 3 mmHg. IAS/Shunts: No atrial level shunt detected by color flow Doppler.  LEFT VENTRICLE PLAX 2D LVIDd:         4.55 cm   Diastology LVIDs:         3.20 cm   LV e' medial:    6.96 cm/s LV PW:         0.90 cm   LV E/e' medial:  10.0 LV IVS:        0.95 cm   LV e' lateral:   9.90 cm/s LVOT diam:     1.90 cm   LV E/e' lateral: 7.0 LV SV:         46 LV SV Index:   21 LVOT Area:     2.84 cm  RIGHT VENTRICLE RV S prime:     8.05 cm/s TAPSE (M-mode): 1.9 cm LEFT ATRIUM           Index        RIGHT ATRIUM           Index LA Vol (A2C): 44.6 ml 20.73 ml/m  RA Area:     9.86 cm LA Vol (A4C): 57.3 ml 26.63 ml/m  RA Volume:   18.40 ml 8.55 ml/m  AORTIC VALVE AV Area (Vmax):  2.18 cm AV Area (Vmean):   2.25 cm AV Area (VTI):     2.30 cm AV Vmax:           96.10 cm/s AV Vmean:          70.700 cm/s AV VTI:            0.201 m AV Peak Grad:      3.7 mmHg AV Mean Grad:      2.0 mmHg LVOT Vmax:         73.80 cm/s LVOT Vmean:        56.100 cm/s LVOT VTI:          0.163 m LVOT/AV VTI ratio: 0.81  AORTA Ao Root diam: 3.10 cm MITRAL VALVE MV Area (PHT): 3.15 cm    SHUNTS MV Decel Time: 241 msec    Systemic VTI:  0.16 m MV E velocity: 69.40 cm/s  Systemic Diam: 1.90 cm MV A velocity: 77.10 cm/s MV E/A ratio:  0.90 Aditya Sabharwal Electronically signed by Ria Commander Signature Date/Time: 08/12/2024/7:02:07 PM    Final    DG Chest Port 1 View Result Date: 08/11/2024 CLINICAL DATA:  Preop cardiovascular exam. EXAM: PORTABLE CHEST 1 VIEW COMPARISON:  July 18, 2024. FINDINGS: The heart size and mediastinal contours are within normal limits. Both lungs are clear. Status post left shoulder arthroplasty. IMPRESSION: No active disease. Electronically Signed   By: Lynwood Landy Raddle M.D.   On: 08/11/2024 14:06   DG Ankle Complete Right Result Date: 08/11/2024 CLINICAL DATA:  Fall. EXAM: RIGHT ANKLE - COMPLETE 3+ VIEW COMPARISON:  None Available. FINDINGS: There is mildly displaced fracture of the lower fourth shaft of the right fibula. There is also displaced fracture of the medial malleolus with resultant disruption of ankle mortise. No other acute fracture or dislocation. No aggressive osseous lesion. Mild diffuse degenerative osteoarthritic changes of imaged joints. Calcaneal spur noted along the Achilles tendon attachment site. No focal soft tissue swelling. No radiopaque foreign bodies. IMPRESSION: *Mildly displaced fracture of the lower fourth shaft of the right fibula. Displaced fracture of the medial malleolus with  resultant disruption of ankle mortise. Electronically Signed   By: Ree Molt M.D.   On: 08/11/2024 09:49     Time coordinating discharge: Over 30 minutes    Alm Apo, MD  [name] 08/31/2024, 10:52 AM

## 2024-08-31 NOTE — Plan of Care (Signed)

## 2024-08-31 NOTE — Progress Notes (Signed)
 [facility]	[place] and gave report to [place]. Waiting for transportation to be called after pt is finished eating.

## 2024-08-31 NOTE — TOC Transition Note (Signed)
 Transition of Care [facility]) - Discharge Note   Patient Details  Name: [name] MRN: [id number] Date of Birth: 1956/04/29  Transition of Care [facility]) CM/SW Contact:  Alfonse JONELLE Rex, RN Phone Number: 08/31/2024, 11:45 AM   Clinical Narrative:   DC order to SNF [facility] [place], [place] F8.  Patient and significant other, Nena, notified. PTAR for transport. No further TOC needs identified at this time.         Final next level of care: Skilled Nursing Facility Barriers to Discharge: Barriers Resolved   Patient Goals and CMS Choice Patient states their goals for this hospitalization and ongoing recovery are:: SNF , then back home CMS Medicare.gov Compare Post Acute Care list provided to:: Patient Choice offered to / list presented to : Patient  ownership interest in [facility].provided to:: Patient    Discharge Placement              Patient chooses bed at:  [facility] [place]) Patient to be transferred to facility by: PTAR Name of family member notified: Bernice Mems (Significant other)  [phone] (Home Phone) Patient and family notified of of transfer: 08/31/24  Discharge Plan and Services Additional resources added to the After Visit Summary for                                       Social Drivers of Health (SDOH) Interventions SDOH Screenings   Food Insecurity: Food Insecurity Present (08/11/2024)  Housing: Low Risk  (08/11/2024)  Transportation Needs: Unmet Transportation Needs (08/11/2024)  Utilities: At Risk (08/11/2024)  Alcohol Screen: Low Risk  (08/04/2018)  Depression (PHQ2-9): High Risk (07/23/2024)  Financial Resource Strain: Low Risk  (08/04/2018)  Physical Activity: Inactive (08/04/2018)  Social Connections: Unknown (08/11/2024)  Stress: No Stress Concern Present (08/04/2018)  Tobacco Use: Low Risk  (08/13/2024)     Readmission Risk Interventions    08/31/2024   11:43 AM  08/21/2024    1:50 PM  Readmission Risk Prevention Plan  Transportation Screening Complete Complete  PCP or Specialist Appt within 3-5 Days Complete Complete  HRI or Home Care Consult Complete Complete  Social Work Consult for Recovery Care Planning/Counseling Complete Complete  Palliative Care Screening Not Applicable Not Applicable  Medication Review Oceanographer) Complete Complete

## 2024-11-09 ENCOUNTER — Other Ambulatory Visit (HOSPITAL_COMMUNITY): Payer: Self-pay | Admitting: Psychiatry

## 2024-11-09 DIAGNOSIS — F411 Generalized anxiety disorder: Secondary | ICD-10-CM

## 2024-12-04 ENCOUNTER — Emergency Department (HOSPITAL_COMMUNITY): Payer: Self-pay

## 2024-12-04 ENCOUNTER — Emergency Department (HOSPITAL_COMMUNITY)
Admission: EM | Admit: 2024-12-04 | Discharge: 2024-12-04 | Disposition: A | Discharge status: Home / Self Care | Payer: Self-pay | Attending: Emergency Medicine | Admitting: Emergency Medicine

## 2024-12-04 ENCOUNTER — Other Ambulatory Visit: Payer: Self-pay

## 2024-12-04 ENCOUNTER — Encounter (HOSPITAL_COMMUNITY): Payer: Self-pay

## 2024-12-04 DIAGNOSIS — Z79899 Other long term (current) drug therapy: Secondary | ICD-10-CM | POA: Insufficient documentation

## 2024-12-04 DIAGNOSIS — R4182 Altered mental status, unspecified: Secondary | ICD-10-CM | POA: Insufficient documentation

## 2024-12-04 DIAGNOSIS — Z7982 Long term (current) use of aspirin: Secondary | ICD-10-CM | POA: Insufficient documentation

## 2024-12-04 DIAGNOSIS — Z7984 Long term (current) use of oral hypoglycemic drugs: Secondary | ICD-10-CM | POA: Insufficient documentation

## 2024-12-04 DIAGNOSIS — N390 Urinary tract infection, site not specified: Secondary | ICD-10-CM

## 2024-12-04 LAB — URINALYSIS, ROUTINE W REFLEX MICROSCOPIC
Bilirubin Urine: NEGATIVE
Glucose, UA: NEGATIVE mg/dL
Hgb urine dipstick: NEGATIVE
Ketones, ur: NEGATIVE mg/dL
Nitrite: POSITIVE — AB
Protein, ur: NEGATIVE mg/dL
Specific Gravity, Urine: 1.018 (ref 1.005–1.030)
WBC, UA: >50 WBC/hpf (ref 0–5)
pH: 5 (ref 5.0–8.0)

## 2024-12-04 LAB — CBC
HCT: 39.8 % (ref 39.0–52.0)
Hemoglobin: 13.8 g/dL (ref 13.0–17.0)
MCH: 32.2 pg (ref 26.0–34.0)
MCHC: 34.7 g/dL (ref 30.0–36.0)
MCV: 93 fL (ref 80.0–100.0)
Platelets: 217 K/uL (ref 150–400)
RBC: 4.28 MIL/uL (ref 4.22–5.81)
RDW: 13.6 % (ref 11.5–15.5)
WBC: 8.7 K/uL (ref 4.0–10.5)
nRBC: 0 % (ref 0.0–0.2)

## 2024-12-04 LAB — COMPREHENSIVE METABOLIC PANEL WITH GFR
ALT: 20 U/L (ref 0–44)
AST: 21 U/L (ref 15–41)
Albumin: 4.1 g/dL (ref 3.5–5.0)
Alkaline Phosphatase: 128 U/L — ABNORMAL HIGH (ref 38–126)
Anion gap: 12 (ref 5–15)
BUN: 19 mg/dL (ref 8–23)
CO2: 23 mmol/L (ref 22–32)
Calcium: 9.5 mg/dL (ref 8.9–10.3)
Chloride: 104 mmol/L (ref 98–111)
Creatinine, Ser: 1.03 mg/dL (ref 0.61–1.24)
GFR, Estimated: >60 mL/min
Glucose, Bld: 154 mg/dL — ABNORMAL HIGH (ref 70–99)
Potassium: 4.2 mmol/L (ref 3.5–5.1)
Sodium: 138 mmol/L (ref 135–145)
Total Bilirubin: 0.3 mg/dL (ref 0.0–1.2)
Total Protein: 6.8 g/dL (ref 6.5–8.1)

## 2024-12-04 LAB — CBG MONITORING, ED: Glucose-Capillary: 150 mg/dL — ABNORMAL HIGH (ref 70–99)

## 2024-12-04 LAB — AMMONIA: Ammonia: 19 umol/L (ref 9–35)

## 2024-12-04 MED ORDER — CEPHALEXIN 500 MG PO CAPS
500.0000 mg | ORAL_CAPSULE | Freq: Once | ORAL | Status: AC
  Administered 2024-12-04: 500 mg via ORAL
  Filled 2024-12-04: qty 1

## 2024-12-04 MED ORDER — CEPHALEXIN 500 MG PO CAPS: 500.0000 mg | ORAL_CAPSULE | Freq: Four times a day (QID) | ORAL | 0 refills | Status: AC

## 2024-12-04 NOTE — ED Provider Notes (Signed)
 " [place] EMERGENCY DEPARTMENT AT [facility] Provider Note   CSN: [id number] Arrival date & time: 12/04/24  9368     Patient presents with: Altered Mental Status   [name] is a 68 y.o. male.   68 year old male who has history of memory issues, depression, schizoaffective disorder, seizures who presents with altered mental status.  Apparently patient woke up this morning and his fiance felt that he was confused.  Patient states that he takes Elavil  at night to help his depression as well as help him sleep and has not changed that dose recently and did take it last night.  Denies any use of alcohol or drugs.  No recent bowel or bladder loss.  No tongue biting.  No recent fever or chills.  No cough or congestion.  No urinary symptoms no recent illnesses according to him.  Called EMS and patient CBG was 131.  GCS of 14.  Patient sent to for further evaluation       Prior to Admission medications  Medication Sig Start Date End Date Taking? Authorizing Provider  acetaminophen  (TYLENOL ) 325 MG tablet Take 2 tablets (650 mg total) by mouth every 4 (four) hours as needed for mild pain (pain score 1-3), fever, moderate pain (pain score 4-6) or headache (or Fever >/= 101). 08/28/24   Patsy Lenis, MD  amitriptyline  (ELAVIL ) 100 MG tablet Take 2 tablets (200 mg total) by mouth at bedtime. 11/21/23   Arfeen, Leni DASEN, MD  aspirin  EC 325 MG tablet Take 1 tablet (325 mg total) by mouth daily. 08/28/24 08/28/25  Patsy Lenis, MD  atorvastatin  (LIPITOR) 40 MG tablet Take 1 tablet (40 mg total) by mouth daily. Patient taking differently: Take 40 mg by mouth at bedtime. 11/18/23   [name], MD  Lacosamide  (VIMPAT ) 150 MG TABS Take 1 tablet (150 mg total) by mouth 2 (two) times daily. 08/31/24   Patsy Lenis, MD  metFORMIN  (GLUCOPHAGE -XR) 500 MG 24 hr tablet Take 2 tablets (1,000 mg total) by mouth daily. Patient taking differently: Take 1,000 mg by mouth at bedtime. 11/18/23    [name], MD  metoprolol  succinate (TOPROL -XL) 50 MG 24 hr tablet Take 1 tablet (50 mg total) by mouth daily. Take with or immediately following a meal. 08/28/24   Patsy Lenis, MD  tamsulosin  (FLOMAX ) 0.4 MG CAPS capsule Take 1 capsule (0.4 mg total) by mouth daily. 08/28/24   Patsy Lenis, MD    Allergies: Codeine, Sulfa antibiotics, and Serotonin reuptake inhibitors (ssris)    Review of Systems  All other systems reviewed and are negative.   Updated Vital Signs BP (!) 142/78   Pulse 63   Temp 97.9 F (36.6 C) (Oral)   Resp (!) 22   Ht 1.753 m (5' 9)   Wt 100.8 kg   SpO2 98%   BMI 32.82 kg/m   Physical Exam Vitals and nursing note reviewed.  Constitutional:      General: He is not in acute distress.    Appearance: Normal appearance. He is well-developed. He is not toxic-appearing.  HENT:     Head: Normocephalic and atraumatic.  Eyes:     General: Lids are normal.     Conjunctiva/sclera: Conjunctivae normal.     Pupils: Pupils are equal, round, and reactive to light.  Neck:     Thyroid: No thyroid mass.     Trachea: No tracheal deviation.  Cardiovascular:     Rate and Rhythm: Normal rate and regular rhythm.  Heart sounds: Normal heart sounds. No murmur heard.    No gallop.  Pulmonary:     Effort: Pulmonary effort is normal. No respiratory distress.     Breath sounds: Normal breath sounds. No stridor. No decreased breath sounds, wheezing, rhonchi or rales.  Abdominal:     General: There is no distension.     Palpations: Abdomen is soft.     Tenderness: There is no abdominal tenderness. There is no rebound.  Musculoskeletal:        General: No tenderness. Normal range of motion.     Cervical back: Normal range of motion and neck supple.  Skin:    General: Skin is warm and dry.     Findings: No abrasion or rash.  Neurological:     General: No focal deficit present.     Mental Status: He is alert and oriented to person, place, and time. Mental  status is at baseline.     GCS: GCS eye subscore is 4. GCS verbal subscore is 5. GCS motor subscore is 6.     Cranial Nerves: No cranial nerve deficit.     Sensory: No sensory deficit.     Motor: Motor function is intact.  Psychiatric:        Attention and Perception: Attention normal.        Mood and Affect: Affect is blunt.        Speech: Speech normal.        Behavior: Behavior normal.     (all labs ordered are listed, but only abnormal results are displayed) Labs Reviewed  CBG MONITORING, ED - Abnormal; Notable for the following components:      Result Value   Glucose-Capillary 150 (*)    All other components within normal limits  COMPREHENSIVE METABOLIC PANEL WITH GFR  CBC  URINALYSIS, ROUTINE W REFLEX MICROSCOPIC    EKG: None  Radiology: No results found.   Procedures   Medications Ordered in the ED - No data to display                                  Medical Decision Making Amount and/or Complexity of Data Reviewed Labs: ordered. Radiology: ordered. ECG/medicine tests: ordered.   Patient CT of his head without evidence of acute findings.  Chest x-ray okay as well.  Patient has been alert oriented x 4 here.  Does not appear encephalopathic.  Ammonia level negative.  He is afebrile.  No leukocytosis on the CBC.  Urinalysis does show infection and patient will be given oral antibiotics here as well as placed on same and discharged home     Final diagnoses:  None    ED Discharge Orders     None          Dasie Faden, MD 12/04/24 1034  "

## 2024-12-04 NOTE — ED Triage Notes (Signed)
 Pt BIB [facility] from home for altered mental status reported by fiance.She reports this has happened 3x in about the past year. A&O x 2. Usually A&O x 4. SOB with exertion, RR increased, pale.   138/82 HR 60 CBG 131 GCS 14

## 2024-12-21 ENCOUNTER — Ambulatory Visit: Payer: Self-pay | Admitting: Family Medicine

## 2024-12-22 ENCOUNTER — Telehealth: Payer: Self-pay

## 2024-12-22 NOTE — Transitions of Care (Post Inpatient/ED Visit) (Signed)
" ° °  12/22/2024  Name: [name] MRN: [id number] DOB: [birth year]  Today's TOC FU Call Status: Today's TOC FU Call Status:: Unsuccessful Call (1st Attempt) Unsuccessful Call (1st Attempt) Date: 12/22/24  Attempted to reach the patient regarding the most recent Inpatient/ED visit.  Follow Up Plan: Additional outreach attempts will be made to reach the patient to complete the Transitions of Care (Post Inpatient/ED visit) call.   Signature Julian Lemmings, LPN [facility] Nurse Health Advisor Direct Dial [phone]  "

## 2024-12-23 NOTE — Transitions of Care (Post Inpatient/ED Visit) (Signed)
" ° °  12/23/2024  Name: KYLYN SOOKRAM MRN: [id number] DOB: 11-08-1956  Today's TOC FU Call Status: Today's TOC FU Call Status:: Unsuccessful Call (2nd Attempt) Unsuccessful Call (1st Attempt) Date: 12/22/24 Unsuccessful Call (2nd Attempt) Date: 12/23/24  Attempted to reach the patient regarding the most recent Inpatient/ED visit.  Follow Up Plan: Additional outreach attempts will be made to reach the patient to complete the Transitions of Care (Post Inpatient/ED visit) call.   Signature Nikki M,CMA "

## 2024-12-25 NOTE — Transitions of Care (Post Inpatient/ED Visit) (Signed)
" ° °  12/25/2024  Name: [name] MRN: [id number] DOB: 15-May-1956  Today's TOC FU Call Status: Today's TOC FU Call Status:: Unsuccessful Call (3rd Attempt) Unsuccessful Call (1st Attempt) Date: 12/22/24 Unsuccessful Call (2nd Attempt) Date: 12/23/24 Unsuccessful Call (3rd Attempt) Date: 12/25/24  Attempted to reach the patient regarding the most recent Inpatient/ED visit.  Follow Up Plan: No further outreach attempts will be made at this time. We have been unable to contact the patient.  Signature Julian Lemmings, LPN [facility] Nurse Health Advisor Direct Dial [phone]  "

## 2025-01-04 ENCOUNTER — Ambulatory Visit: Payer: Self-pay | Admitting: Family Medicine

## 2025-01-14 ENCOUNTER — Ambulatory Visit (HOSPITAL_COMMUNITY): Payer: Self-pay | Admitting: Psychiatry

## 2025-01-14 ENCOUNTER — Encounter (HOSPITAL_COMMUNITY): Payer: Self-pay | Admitting: Psychiatry

## 2025-01-14 VITALS — BP 134/76 | HR 79 | Resp 22 | Ht 69.0 in | Wt 197.2 lb

## 2025-01-14 DIAGNOSIS — F410 Panic disorder [episodic paroxysmal anxiety] without agoraphobia: Secondary | ICD-10-CM

## 2025-01-14 DIAGNOSIS — F411 Generalized anxiety disorder: Secondary | ICD-10-CM

## 2025-01-14 DIAGNOSIS — F5101 Primary insomnia: Secondary | ICD-10-CM

## 2025-01-14 DIAGNOSIS — F250 Schizoaffective disorder, bipolar type: Secondary | ICD-10-CM

## 2025-01-14 MED ORDER — QUETIAPINE FUMARATE 100 MG PO TABS: 100.0000 mg | ORAL_TABLET | Freq: Every day | ORAL | 1 refills | Status: AC

## 2025-01-14 MED ORDER — AMITRIPTYLINE HCL 100 MG PO TABS: 200.0000 mg | ORAL_TABLET | Freq: Every day | ORAL | 1 refills | Status: AC

## 2025-01-14 MED ORDER — CLONAZEPAM 0.5 MG PO TABS: 0.5000 mg | ORAL_TABLET | Freq: Every day | ORAL | 0 refills | Status: AC | PRN

## 2025-01-14 NOTE — Progress Notes (Signed)
 [facility] MD/PA/NP OP Progress Note  Patient location; office Provider location; office   01/14/2025 12:24 PM [name]  MRN:  [id number]  Chief Complaint:  Chief Complaint  Patient presents with   Anxiety   Follow-up   Medication Refill   HPI: Patient came to the office for his follow-up ointment.  He was last seen in December 2024.  Patient told he lost his insurance and wanted to extend the visit and then recently he became sick.  He was admitted to the hospital for shortness of breath and given the diagnosis of flu.  He also had a fall and fracture his right tibia.  Patient came today by himself.  He is living with his friend and able to drive but reported a lot of fatigue, tiredness and shortness of breath.  He reported cannot walk very well and started to have hyperventilating.  He reported have a left foot surgery but do not remember the details.  He was recently discharged from [facility] where he he stayed for 2 weeks and as per discharge summary he is on  Seroquel , Klonopin  and amitriptyline  but he is not taking Seroquel  and Klonopin .  He is not sure why but believes he was not given the prescription upon discharge.  He is taking amitriptyline  200 mg daily.  He denies any suicidal thoughts or homicidal thoughts.  He admitted paranoia but stable.  He denies any hallucination.  He does not leave the house unless it is necessary.  He has a lot of pain in his foot.  He is not using any walker or cane.  Patient is limited to provide formation.  He is more focused on his shortness of breath fatigue and tiredness.  I review collect information.  Office for [facility] family practice had called few times to check on him but he did not pick up the phone.  He denies any anger or agitation.  He admitted struggle with memory issues but lately he feel things are going okay.  He still prefer not to have insurance because he like to pay cash.  His appetite is okay.  His sleep on and off.  His  attention concentration is fair and he gets distracted in the conversation.  Visit Diagnosis:    ICD-10-CM   1. Schizoaffective disorder, bipolar type (HCC)  F25.0 QUEtiapine  (SEROQUEL ) 100 MG tablet    2. GAD (generalized anxiety disorder)  F41.1 amitriptyline  (ELAVIL ) 100 MG tablet    3. Primary insomnia  F51.01     4. Panic attacks  F41.0 clonazePAM  (KLONOPIN ) 0.5 MG tablet       Past Psychiatric History: Reviewed H/O depression, paranoia, suicidal attempt, delusions and multiple inpatient.  H/O cutting testicles and thoughts of inhaling helium to end life. Last inpatient August 2016.  D/C on Seroquel  900 mg and amitriptyline  400 mg HS. Saw at [facility] and [name]. SSRI caused reactions. Tried nortriptyline, Haldol , Geodon, Seroquel , lithium, Prozac, Wellbutrin , Paxil, Klonopin , Ativan , Lexapro, Zoloft, abilify and Lamictal .  No H/O mania or drug use.  We did GeneSight testing and results are in the chart.  Only antipsychotic medication that is unfavorable section is Saphris and Navane .  Navane  made restless. Most of the SSRIs are also unfavorable section.   Past Medical History:  Past Medical History:  Diagnosis Date   Anxiety    Bowel obstruction (HCC)    Chronic insomnia 04/14/2020   Colon polyps    Constipation    Depression    Diabetes  mellitus without complication (HCC)    Dyslipidemia    GERD (gastroesophageal reflux disease)    Hearing loss    History of echocardiogram    Echo 8/18: EF 50-55, normal wall motion, grade 1 diastolic dysfunction, mild MR   Hypertension    Memory difficulty 10/25/2017   Memory loss    MRSA carrier    Pneumonia    Schizoaffective disorder    Seizures (HCC)    last 4-5 years ago per pt   Skin cancer     Past Surgical History:  Procedure Laterality Date   CARPAL TUNNEL RELEASE     COLONOSCOPY  12/17/2008   COLONOSCOPY  07/23/2018   Dr.Jacobs   COLONOSCOPY WITH PROPOFOL  N/A 07/05/2022   Procedure: COLONOSCOPY WITH PROPOFOL ;   Surgeon: Teressa Toribio SQUIBB, MD;  Location: [facility];  Service: Gastroenterology;  Laterality: N/A;  Incomplete Colon   COLONOSCOPY WITH PROPOFOL  N/A 08/09/2022   Procedure: COLONOSCOPY WITH PROPOFOL ;  Surgeon: Mansouraty, Aloha Raddle., MD;  Location: [facility];  Service: Gastroenterology;  Laterality: N/A;   ORIF ANKLE FRACTURE Left 11/26/2018   Procedure: OPEN REDUCTION INTERNAL FIXATION (ORIF) ANKLE FRACTURE;  Surgeon: Cristy Bonner DASEN, MD;  Location: [facility];  Service: Orthopedics;  Laterality: Left;   ORIF ANKLE FRACTURE Right 08/13/2024   Procedure: OPEN REDUCTION INTERNAL FIXATION (ORIF) ANKLE FRACTURE;  Surgeon: Elsa Lonni SAUNDERS, MD;  Location: [facility];  Service: Orthopedics;  Laterality: Right;   ORIF SHOULDER FRACTURE Right    Arthroplasty   POLYPECTOMY  08/09/2022   Procedure: POLYPECTOMY;  Surgeon: Mansouraty, Aloha Raddle., MD;  Location: [facility];  Service: Gastroenterology;;   self orchectomy Bilateral    SHOULDER CLOSED REDUCTION Left 09/16/2013   Procedure: CLOSED REDUCTION SHOULDER;  Surgeon: Donnice JONETTA Car, MD;  Location: [facility];  Service: Orthopedics;  Laterality: Left;   SHOULDER HEMI-ARTHROPLASTY Left 09/18/2013   Procedure: LEFT SHOULDER HEMI-ARTHROPLASTY;  Surgeon: Elspeth SAUNDERS Her, MD;  [facility] OR;  Service: Orthopedics;  Laterality: Left;   SYNDESMOSIS REPAIR Right 08/13/2024   Procedure: REPAIR, SYNDESMOSIS, ANKLE;  Surgeon: Elsa Lonni SAUNDERS, MD;  Location: [facility];  Service: Orthopedics;  Laterality: Right;   TIBIA IM NAIL INSERTION Left 11/26/2018   Procedure: INTRAMEDULLARY (IM) NAIL TIBIAL;  Surgeon: Cristy Bonner DASEN, MD;  Location: [facility];  Service: Orthopedics;  Laterality: Left;   UPPER GASTROINTESTINAL ENDOSCOPY  07/23/2018   Dr.Jacobs    Family Psychiatric History: Reviewed  Family History:  Family History  Problem Relation Age of Onset   Stroke Mother        Stroke in late 27's. Died in her 40's   Fibromyalgia Mother    Stroke Father         Living at 49   Alcohol abuse Brother    Alcohol abuse Brother    Diabetes Brother    Colon cancer Neg Hx    Esophageal cancer Neg Hx    Stomach cancer Neg Hx    Rectal cancer Neg Hx     Social History:  Social History   Socioeconomic History   Marital status: Single    Spouse name: Not on file   Number of children: 0   Years of education: 18   Highest education level: Not on file  Occupational History   Occupation: disabled  Tobacco Use   Smoking status: Never    Passive exposure: Never   Smokeless tobacco: Never  Vaping Use   Vaping status: Never Used  Substance and Sexual Activity  Alcohol use: No    Alcohol/week: 0.0 standard drinks of alcohol   Drug use: No   Sexual activity: Not Currently    Partners: Female    Birth control/protection: None  Other Topics Concern   Not on file  Social History Narrative   Patient does not drink caffeine.   Patient is left handed.   Social Drivers of Health   Tobacco Use: Low Risk (12/16/2024)   [facility]   Patient History    Smoking Tobacco Use: Never    Smokeless Tobacco Use: Never    Passive Exposure: Not on file  Financial Resource Strain: Not on file  Food Insecurity: No Food Insecurity (12/14/2024)   Received from [facility]   Epic    Within the past 12 months, you worried that your food would run out before you got the money to buy more.: Never true    Within the past 12 months, the food you bought just didn't last and you didn't have money to get more.: Never true  Transportation Needs: No Transportation Needs (12/14/2024)   Received from [facility]    In the past 12 months, has lack of transportation kept you from medical appointments or from getting medications?: No    In the past 12 months, has lack of transportation kept you from meetings, work, or from getting things needed for daily living?: No  Physical Activity: Not on file  Stress: No Stress Concern Present (12/14/2024)    Received from [facility] - Occupational Stress Questionnaire    Do you feel stress - tense, restless, nervous, or anxious, or unable to sleep at night because your mind is troubled all the time - these days?: Only a little  Social Connections: Unknown (08/11/2024)   Social Connection and Isolation Panel    Frequency of Communication with Friends and Family: Not on file    Frequency of Social Gatherings with Friends and Family: Not on file    Attends Religious Services: Not on file    Active Member of Clubs or Organizations: Not on file    Attends Banker Meetings: Not on file    Marital Status: Never married  Depression (PHQ2-9): High Risk (07/23/2024)   Depression (PHQ2-9)    PHQ-2 Score: 14  Alcohol Screen: Not on file  Housing: Low Risk (12/14/2024)   Received from [facility]    In the last 12 months, was there a time when you were not able to pay the mortgage or rent on time?: No    In the past 12 months, how many times have you moved where you were living?: 0    At any time in the past 12 months, were you homeless or living in a shelter (including now)?: No  Utilities: Not At Risk (12/14/2024)   Received from [facility]    In the past 12 months has the electric, gas, oil, or water company threatened to shut off services in your home?: No  Health Literacy: Not on file    Allergies:  Allergies  Allergen Reactions   Codeine Itching and Nausea Only   Sulfa Antibiotics Nausea And Vomiting and Other (See Comments)    Flu-like symptoms   Serotonin Reuptake Inhibitors (Ssris) Other (See Comments)    Restlessness    Metabolic Disorder Labs: Lab Results  Component Value Date   HGBA1C 6.9 (H) 08/11/2024   MPG  151.33 08/11/2024   MPG 111 07/30/2015   Lab Results  Component Value Date   PROLACTIN 12.0 07/13/2015   Lab Results  Component Value Date   CHOL 86 (L) 11/18/2023   TRIG 152 (H)  11/18/2023   HDL 35 (L) 11/18/2023   CHOLHDL 2.5 11/18/2023   VLDL 28 02/18/2017   LDLCALC 25 11/18/2023   LDLCALC 47 08/28/2022   Lab Results  Component Value Date   TSH 2.730 08/19/2024   TSH 1.240 05/25/2021    Therapeutic Level Labs: No results found for: LITHIUM No results found for: VALPROATE No results found for: CBMZ  Current Medications: Current Outpatient Medications  Medication Sig Dispense Refill   acetaminophen  (TYLENOL ) 325 MG tablet Take 2 tablets (650 mg total) by mouth every 4 (four) hours as needed for mild pain (pain score 1-3), fever, moderate pain (pain score 4-6) or headache (or Fever >/= 101).     amitriptyline  (ELAVIL ) 100 MG tablet Take 2 tablets (200 mg total) by mouth at bedtime. 60 tablet 2   aspirin  EC 325 MG tablet Take 1 tablet (325 mg total) by mouth daily. 30 tablet 1   atorvastatin  (LIPITOR) 40 MG tablet Take 1 tablet (40 mg total) by mouth daily. (Patient taking differently: Take 40 mg by mouth at bedtime.) 90 tablet 3   cephALEXin  (KEFLEX ) 500 MG capsule Take 1 capsule (500 mg total) by mouth 4 (four) times daily. 20 capsule 0   Lacosamide  (VIMPAT ) 150 MG TABS Take 1 tablet (150 mg total) by mouth 2 (two) times daily. 30 tablet 1   metFORMIN  (GLUCOPHAGE -XR) 500 MG 24 hr tablet Take 2 tablets (1,000 mg total) by mouth daily. (Patient taking differently: Take 1,000 mg by mouth at bedtime.) 180 tablet 3   metoprolol  succinate (TOPROL -XL) 50 MG 24 hr tablet Take 1 tablet (50 mg total) by mouth daily. Take with or immediately following a meal. 90 tablet 2   tamsulosin  (FLOMAX ) 0.4 MG CAPS capsule Take 1 capsule (0.4 mg total) by mouth daily. 90 capsule 2   No current facility-administered medications for this visit.     Musculoskeletal: Strength & Muscle Tone: within normal limits Gait & Station: normal Patient leans: N/A  Psychiatric Specialty Exam: Review of Systems  Respiratory:  Positive for cough and shortness of breath.    Musculoskeletal:        Right foot pain  Psychiatric/Behavioral:  Positive for decreased concentration, dysphoric mood and sleep disturbance. The patient is nervous/anxious.     Blood pressure 134/76, pulse 79, resp. rate (!) 22, height 5' 9 (1.753 m), weight 197 lb 3.2 oz (89.4 kg), SpO2 (!) 85%.There is no height or weight on file to calculate BMI.  General Appearance: Fairly Groomed  Eye Contact:  Fair  Speech:  Slow  Volume:  Decreased  Mood:  Anxious  Affect:  Congruent  Thought Process:  Descriptions of Associations: Intact  Orientation:  Full (Time, Place, and Person)  Thought Content: Rumination   Suicidal Thoughts:  No  Homicidal Thoughts:  No  Memory:  Immediate;   Fair Recent;   Fair Remote;   Fair  Judgement:  Fair  Insight:  Shallow  Psychomotor Activity:  Decreased  Concentration:  Concentration: Fair and Attention Span: Fair  Recall:  [employer] of Knowledge: Fair  Language: Good  Akathisia:  No  Handed:  Right  AIMS (if indicated): not done  Assets:  Communication Skills Desire for Improvement Housing  ADL's:  Intact  Cognition: Impaired,  Mild  Sleep:  Fair   Screenings: AIMS    Flowsheet Row [facility]  AIMS Total Score 0   AUDIT    Flowsheet Row [facility] (Dis[facility] Ad[facility] ([facility]  Alcohol Use Disorder Identification Test Final Score (AUDIT) 0 0 0   Mini-Mental    Flowsheet Row Office Visit from 10/07/2019 in [facility] Office Visit from 05/15/2018 in [facility] Office Visit from 10/25/2017 in [facility]  Total Score (max 30 points ) 27 27 25    PHQ2-9     Flowsheet Row Office Visit from 07/23/2024 in [facility] Of [place]. [facility] Office Visit from 11/18/2023 in [facility] - A Dept Of [place]. [facility] Office Visit from 04/26/2023 in [facility] - [facility] Dept Of [place]. [facility] Office Visit from 01/01/2023 in [facility] - A Dept Of [place]. [facility] Office Visit from 11/19/2022 in [facility] - A Dept Of [place]. [facility]  PHQ-2 Total Score 3 4 5 4 3   PHQ-9 Total Score 14 13 17 14 15    Flowsheet Row ED from 12/04/2024 in [facility] Emergency Department at [facility] ED to Hosp-Admission (Discharged) from 08/11/2024 in [facility] ED from 07/18/2024 in [facility] Emergency Department at [facility]  C-SSRS RISK CATEGORY No Risk No Risk No Risk     Assessment and Plan: Patient is 69 year old Caucasian unemployed man with schizoaffective disorder, primary insomnia, panic attacks and generalized anxiety disorder.  Discussed compliance with the medication.  We have recommended neuropsych testing but patient do not remember if he had testing done.  He appears somewhat confused about his past.  He used to take Belsomra  and very high dose of Seroquel  but gradually Belsomra  was discontinued.  He was also in Klonopin  but not sure why he is not taking.  He is also not taking Seroquel .  When I ask about discharge summary medication, he do not recall very well.  Recommend to restart Seroquel  100 mg for now.  We will provide clonazepam  0.5 mg to take as needed for severe panic attack.  Continue amitriptyline  200 mg at bedtime.  I emphasized that he need to follow-up with his primary care after he had a fall and broken his foot and having shortness of breath.  He was recently discharged from the hospital after 2 weeks.  He is still struggling with breathing issues.  Reviewed blood work  results.  His last hemoglobin A1c above 6.  Reminded he need to follow-up with neurology for neurocognitive testing.  Follow-up in 6 to 8 weeks unless patient has question.  Reminded medication side effects especially postural hypotension, metabolic syndrome.  I will forward my note to his PCP.  Collaboration of Care: Collaboration of Care: Other provider involved in patient's care AEB notes are available in epic to review  Patient/Guardian was advised Release of Information must be obtained prior to any record release in order to collaborate their care with an outside provider. Patient/Guardian was advised if they have not already done so to contact the registration department to  sign all necessary forms in order for us  to release information regarding their care.   Consent: Patient/Guardian gives verbal consent for treatment and assignment of benefits for services provided during this visit. Patient/Guardian expressed understanding and agreed to proceed.   I personally spent a total of 35 minutes in the care of the patient today including preparing to see the patient, getting/reviewing separately obtained history, performing a medically appropriate exam/evaluation, counseling and educating, placing orders, referring and communicating with other health care professionals, documenting clinical information in the EHR, independently interpreting results, communicating results, and coordinating care.   Leni ONEIDA Client, MD 01/14/2025, 12:24 PM

## 2025-01-22 NOTE — Progress Notes (Unsigned)
" ° ° °  SUBJECTIVE:   Chief compliant/HPI: annual examination  [name] is a 69 y.o. who presents today for an annual exam.   Diabetes Current Regimen: Metformin  1000 mg daily CBGs: ***  Last A1c:  Lab Results  Component Value Date   HGBA1C 6.9 (H) 08/11/2024    Denies polyuria, polydipsia, hypoglycemia *** Last Eye Exam: Due Statin: Atorvastatin  40 mg daily ACE/ARB: None  History tabs reviewed and updated ***.   Review of systems form reviewed and notable for ***.   OBJECTIVE:   There were no vitals taken for this visit.  ***  ASSESSMENT/PLAN:   Assessment & Plan Type 2 diabetes mellitus with other specified complication, without long-term current use of insulin  [facility])  Annual Examination  See AVS for age appropriate recommendations.  PHQ score ***, reviewed and discussed.  Blood pressure value is *** goal, discussed.   Considered the following screening exams based upon USPSTF recommendations: Diabetes screening: {FMCANNUALORDERED:33692} HIV testing:{FMCANNUALORDERED:33692} Hepatitis C: {FMCANNUALORDERED:33692} Hepatitis B:{FMCANNUALORDERED:33692} Syphilis if at high risk: {FMCANNUALORDERED:33692} GC/CT {GC/CT screening :23818} Lipid panel (nonfasting or fasting) discussed based upon AHA recommendations and {FMCLIPID:33694}.  Consider repeat every 4-6 years.  Reviewed risk factors for latent tuberculosis and {not indicated/requested/declined:14582}  Cancer Screening Discussion  Lung cancer screening:{FMCLUNGCANCERSCREENIN:33695}.  See documentation below regarding indications/risks/benefits.  Colorectal cancer screening: {crcscreen:23821::discussed, colonoscopy ordered}.  PSA discussed and after engaging in discussion of possible risks, benefits and complications of screening. PSA: {not indicated/requested/declined:14582} Vaccinations ***.   Follow up in 1 *** year or sooner if indicated.  MyChart Activation: {MYCHARTLIST:32522}   Follow up in 1 year or  sooner if indicated.  MyChart Activation: {MYCHARTLIST:32522}   Izetta Nap, MD [facility]    "

## 2025-01-25 ENCOUNTER — Encounter: Payer: Self-pay | Admitting: Family Medicine

## 2025-01-25 DIAGNOSIS — E1169 Type 2 diabetes mellitus with other specified complication: Secondary | ICD-10-CM

## 2025-02-25 ENCOUNTER — Ambulatory Visit (HOSPITAL_COMMUNITY): Payer: Self-pay | Admitting: Psychiatry
